# Patient Record
Sex: Male | Born: 1955
Health system: Southern US, Community
[De-identification: ages and names within clinical notes are randomized; demographics above are authoritative.]

## PROBLEM LIST (undated history)

## (undated) DIAGNOSIS — D72829 Elevated white blood cell count, unspecified: Secondary | ICD-10-CM

## (undated) DIAGNOSIS — N179 Acute kidney failure, unspecified: Secondary | ICD-10-CM

## (undated) DIAGNOSIS — E119 Type 2 diabetes mellitus without complications: Secondary | ICD-10-CM

## (undated) DIAGNOSIS — K5900 Constipation, unspecified: Secondary | ICD-10-CM

## (undated) DIAGNOSIS — D75838 Other thrombocytosis: Secondary | ICD-10-CM

## (undated) DIAGNOSIS — A419 Sepsis, unspecified organism: Secondary | ICD-10-CM

## (undated) DIAGNOSIS — R197 Diarrhea, unspecified: Secondary | ICD-10-CM

## (undated) DIAGNOSIS — R945 Abnormal results of liver function studies: Secondary | ICD-10-CM

## (undated) DIAGNOSIS — F1010 Alcohol abuse, uncomplicated: Secondary | ICD-10-CM

## (undated) DIAGNOSIS — E049 Nontoxic goiter, unspecified: Secondary | ICD-10-CM

## (undated) DIAGNOSIS — E079 Disorder of thyroid, unspecified: Secondary | ICD-10-CM

## (undated) DIAGNOSIS — E785 Hyperlipidemia, unspecified: Secondary | ICD-10-CM

## (undated) DIAGNOSIS — M79604 Pain in right leg: Secondary | ICD-10-CM

## (undated) DIAGNOSIS — R7989 Other specified abnormal findings of blood chemistry: Secondary | ICD-10-CM

## (undated) DIAGNOSIS — T8743 Infection of amputation stump, right lower extremity: Secondary | ICD-10-CM

## (undated) DIAGNOSIS — T8149XA Infection following a procedure, other surgical site, initial encounter: Secondary | ICD-10-CM

## (undated) DIAGNOSIS — M109 Gout, unspecified: Secondary | ICD-10-CM

## (undated) DIAGNOSIS — I10 Essential (primary) hypertension: Secondary | ICD-10-CM

## (undated) HISTORY — DX: Other thrombocytosis: D75.838

## (undated) HISTORY — DX: Sepsis, unspecified organism: A41.9

## (undated) HISTORY — DX: Hyperlipidemia, unspecified: E78.5

## (undated) HISTORY — DX: Pain in right leg: M79.604

## (undated) HISTORY — DX: Abnormal results of liver function studies: R94.5

## (undated) HISTORY — DX: Infection of amputation stump, right lower extremity: T87.43

## (undated) HISTORY — DX: Nontoxic goiter, unspecified: E04.9

## (undated) HISTORY — PX: THYROID SURGERY: SHX805

## (undated) HISTORY — DX: Disorder of thyroid, unspecified: E07.9

## (undated) HISTORY — DX: Other specified abnormal findings of blood chemistry: R79.89

## (undated) HISTORY — DX: Acute kidney failure, unspecified: N17.9

## (undated) HISTORY — DX: Gout, unspecified: M10.9

## (undated) HISTORY — DX: Type 2 diabetes mellitus without complications: E11.9

## (undated) HISTORY — DX: Essential (primary) hypertension: I10

## (undated) HISTORY — DX: Constipation, unspecified: K59.00

## (undated) HISTORY — DX: Diarrhea, unspecified: R19.7

## (undated) HISTORY — DX: Elevated white blood cell count, unspecified: D72.829

---

## 2011-05-10 DIAGNOSIS — E039 Hypothyroidism, unspecified: Secondary | ICD-10-CM | POA: Insufficient documentation

## 2011-12-01 ENCOUNTER — Other Ambulatory Visit: Payer: Self-pay | Admitting: Oncology

## 2013-04-23 DIAGNOSIS — Z8639 Personal history of other endocrine, nutritional and metabolic disease: Secondary | ICD-10-CM | POA: Insufficient documentation

## 2014-08-04 ENCOUNTER — Encounter: Payer: Self-pay | Admitting: Gastroenterology

## 2014-09-16 ENCOUNTER — Ambulatory Visit (AMBULATORY_SURGERY_CENTER): Payer: Self-pay

## 2014-09-16 VITALS — Ht 68.0 in | Wt 179.0 lb

## 2014-09-16 DIAGNOSIS — Z1211 Encounter for screening for malignant neoplasm of colon: Secondary | ICD-10-CM

## 2014-09-16 MED ORDER — MOVIPREP 100 G PO SOLR: ORAL | Status: DC

## 2014-09-16 NOTE — Progress Notes (Signed)
Per pt, no allergies to soy or egg products.Pt not taking any weight loss meds or using  O2 at home. 

## 2014-09-30 ENCOUNTER — Encounter: Payer: Self-pay | Admitting: Gastroenterology

## 2014-10-01 ENCOUNTER — Encounter: Payer: Self-pay | Admitting: Gastroenterology

## 2014-10-07 ENCOUNTER — Encounter: Payer: Self-pay | Admitting: Gastroenterology

## 2014-10-07 ENCOUNTER — Ambulatory Visit (AMBULATORY_SURGERY_CENTER): Payer: PRIVATE HEALTH INSURANCE | Admitting: Gastroenterology

## 2014-10-07 VITALS — BP 143/85 | HR 61 | Temp 97.7°F | Resp 17 | Ht 68.0 in | Wt 179.0 lb

## 2014-10-07 DIAGNOSIS — Z1211 Encounter for screening for malignant neoplasm of colon: Secondary | ICD-10-CM

## 2014-10-07 MED ORDER — SODIUM CHLORIDE 0.9 % IV SOLN: 500.0000 mL | INTRAVENOUS | Status: DC

## 2014-10-07 NOTE — Progress Notes (Signed)
Patient awakening,vss,report to rn 

## 2014-10-07 NOTE — Op Note (Signed)
[facility]  [address] [place], [id number]   COLONOSCOPY PROCEDURE REPORT  PATIENT: Todd, [name]  MR#: [id number] BIRTHDATE: 08/30/1955 , 35  yrs. old GENDER: male ENDOSCOPIST: Milus Banister, MD REFERRED [id number] Laurance Flatten, M.D. PROCEDURE DATE:  10/07/2014 PROCEDURE:   Colonoscopy, screening First Screening Colonoscopy - Avg.  risk and is 50 yrs.  old or older Yes.  Prior Negative Screening - Now for repeat screening. N/A  History of Adenoma - Now for follow-up colonoscopy & has been > or = to 3 yrs.  N/A ASA CLASS:   Class II INDICATIONS:Screening for colonic neoplasia and Colorectal Neoplasm Risk Assessment for this procedure is average risk. MEDICATIONS: Monitored anesthesia care and Propofol 300 mg IV  DESCRIPTION OF PROCEDURE:   After the risks benefits and alternatives of the procedure were thoroughly explained, informed consent was obtained.  The digital rectal exam revealed no abnormalities of the rectum.   The LB [id number]  endoscope was introduced through the anus and advanced to the cecum, which was identified by both the appendix and ileocecal valve. No adverse events experienced.   The quality of the prep was good.  The instrument was then slowly withdrawn as the colon was fully examined.   COLON FINDINGS: There was mild diverticulosis noted in the left colon.   The examination was otherwise normal.  Retroflexed views revealed no abnormalities. The time to cecum = 2.3 Withdrawal time = 7.2   The scope was withdrawn and the procedure completed. COMPLICATIONS: There were no immediate complications.  ENDOSCOPIC IMPRESSION: 1.   Mild diverticulosis was noted in the left colon 2.   The examination was otherwise normal  RECOMMENDATIONS: You should continue to follow colorectal cancer screening guidelines for "routine risk" patients with a repeat colonoscopy in 10 years.  eSigned:  Milus Banister, MD 10/07/2014 10:24  AM

## 2014-10-07 NOTE — Patient Instructions (Signed)
YOU HAD AN ENDOSCOPIC PROCEDURE TODAY AT [facility]:   Refer to the procedure report that was given to you for any specific questions about what was found during the examination.  If the procedure report does not answer your questions, please call your gastroenterologist to clarify.  If you requested that your care partner not be given the details of your procedure findings, then the procedure report has been included in a sealed envelope for you to review at your convenience later.  YOU SHOULD EXPECT: Some feelings of bloating in the abdomen. Passage of more gas than usual.  Walking can help get rid of the air that was put into your GI tract during the procedure and reduce the bloating. If you had a lower endoscopy (such as a colonoscopy or flexible sigmoidoscopy) you may notice spotting of blood in your stool or on the toilet paper. If you underwent a bowel prep for your procedure, you may not have a normal bowel movement for a few days.  Please Note:  You might notice some irritation and congestion in your nose or some drainage.  This is from the oxygen used during your procedure.  There is no need for concern and it should clear up in a day or so.  SYMPTOMS TO REPORT IMMEDIATELY:   Following lower endoscopy (colonoscopy or flexible sigmoidoscopy):  Excessive amounts of blood in the stool  Significant tenderness or worsening of abdominal pains  Swelling of the abdomen that is new, acute  Fever of 100F or higher    For urgent or emergent issues, a gastroenterologist can be reached at any hour by calling [phone].   DIET: Your first meal following the procedure should be a small meal and then it is ok to progress to your normal diet. Heavy or fried foods are harder to digest and may make you feel nauseous or bloated.  Likewise, meals heavy in dairy and vegetables can increase bloating.  Drink plenty of fluids but you should avoid alcoholic beverages for 24  hours.  ACTIVITY:  You should plan to take it easy for the rest of today and you should NOT DRIVE or use heavy machinery until tomorrow (because of the sedation medicines used during the test).    FOLLOW UP: Our staff will call the number listed on your records the next business day following your procedure to check on you and address any questions or concerns that you may have regarding the information given to you following your procedure. If we do not reach you, we will leave a message.  However, if you are feeling well and you are not experiencing any problems, there is no need to return our call.  We will assume that you have returned to your regular daily activities without incident.  If any biopsies were taken you will be contacted by phone or by letter within the next 1-3 weeks.  Please call us at [phone] if you have not heard about the biopsies in 3 weeks.    SIGNATURES/CONFIDENTIALITY: You and/or your care partner have signed paperwork which will be entered into your electronic medical record.  These signatures attest to the fact that that the information above on your After Visit Summary has been reviewed and is understood.  Full responsibility of the confidentiality of this discharge information lies with you and/or your care-partner.  Resume medications. Information given on diverticulosis and high fiber diet with discharge instructions.

## 2014-10-08 ENCOUNTER — Telehealth: Payer: Self-pay | Admitting: *Deleted

## 2014-10-08 NOTE — Telephone Encounter (Signed)
  Follow up Call-  Call back number 10/07/2014  Post procedure Call Back phone  # [phone]  Permission to leave phone message Yes      CALLED PATIENT TWICE, BOTH TIMES SOMEONE ANSWERS BUT JUST SAYS "HELLO", THEY ACT Mountain HEAR ME SPEAKING TO THEM. I DID USE 2 DIFFERENT PHONES HERE.

## 2015-03-11 ENCOUNTER — Encounter: Payer: Self-pay | Admitting: Family Medicine

## 2015-03-11 ENCOUNTER — Ambulatory Visit (INDEPENDENT_AMBULATORY_CARE_PROVIDER_SITE_OTHER): Payer: BLUE CROSS/BLUE SHIELD | Admitting: Family Medicine

## 2015-03-11 VITALS — BP 149/77 | HR 65 | Temp 97.9°F | Ht 68.0 in | Wt 178.0 lb

## 2015-03-11 DIAGNOSIS — M25571 Pain in right ankle and joints of right foot: Secondary | ICD-10-CM

## 2015-03-11 DIAGNOSIS — M79672 Pain in left foot: Secondary | ICD-10-CM | POA: Diagnosis not present

## 2015-03-11 MED ORDER — INDOMETHACIN 25 MG PO CAPS: ORAL_CAPSULE | ORAL | Status: DC

## 2015-03-11 NOTE — Patient Instructions (Signed)

## 2015-03-11 NOTE — Progress Notes (Signed)
   Subjective:    Patient ID: Todd [name], male    DOB: [birth year], 59 y.o.   MRN: [id number]  HPI Patient here today for left foot and right ankle pain. There is no history of gout and when questioned he denies any redness or increased temperature in the area. The distribution and the acute nature of his symptoms are still suggestive of gout beginning in the great toe and then spreading to the instep. He denies excess alcohol prior to onset. He is on no diarrhetic's which might raise uric acid.      There are no active problems to display for this patient.  Outpatient Encounter Prescriptions as of 03/11/2015  Medication Sig  . levothyroxine (SYNTHROID, LEVOTHROID) 112 MCG tablet Take 112 mcg by mouth daily before breakfast.   No facility-administered encounter medications on file as of 03/11/2015.      Review of Systems  Constitutional: Negative.   HENT: Negative.   Eyes: Negative.   Respiratory: Negative.   Cardiovascular: Negative.   Gastrointestinal: Negative.   Endocrine: Negative.   Genitourinary: Negative.   Musculoskeletal: Positive for arthralgias (left foot and right ankle pain).  Skin: Negative.   Allergic/Immunologic: Negative.   Neurological: Negative.   Hematological: Negative.   Psychiatric/Behavioral: Negative.        Objective:   Physical Exam  Constitutional: He appears well-developed and well-nourished.  Cardiovascular: Normal rate and regular rhythm.   Pulmonary/Chest: Effort normal and breath sounds normal.  Musculoskeletal:  There is no erythema or increased temp and joints of the feet. There is no soft tissue swelling.   BP 149/77 mmHg  Pulse 65  Temp(Src) 97.9 F (36.6 C) (Oral)  Ht 5\' 8"  (1.727 m)  Wt 178 lb (80.74 kg)  BMI 27.07 kg/m2        Assessment & Plan:  1. Left foot pain Will work on the assumption that symptoms are that of gout. Onset was one week ago so classic findings may have disappeared. Uric acid level if elevated  would support diagnosis positive lower normal does not refute the diagnosis either. Will treat with indomethacin 50 mg 3 times a day for 2 days and 25 mg 3 times a day for 5 days with 1 refill. If attacks occur frequently will consider use of medicine to lower uric acid, if elevated - Uric acid   Wardell Honour MD 2. Right ankle pain  - Uric acid

## 2015-03-12 LAB — URIC ACID: Uric Acid: 9.5 mg/dL — ABNORMAL HIGH (ref 3.7–8.6)

## 2015-05-06 ENCOUNTER — Encounter: Payer: BLUE CROSS/BLUE SHIELD | Admitting: Family Medicine

## 2015-05-13 ENCOUNTER — Encounter: Payer: BLUE CROSS/BLUE SHIELD | Admitting: Family Medicine

## 2015-05-20 ENCOUNTER — Ambulatory Visit (INDEPENDENT_AMBULATORY_CARE_PROVIDER_SITE_OTHER): Payer: BLUE CROSS/BLUE SHIELD | Admitting: Family Medicine

## 2015-05-20 ENCOUNTER — Encounter: Payer: Self-pay | Admitting: Family Medicine

## 2015-05-20 VITALS — BP 132/75 | HR 73 | Temp 97.5°F | Ht 68.0 in | Wt 185.0 lb

## 2015-05-20 DIAGNOSIS — Z1159 Encounter for screening for other viral diseases: Secondary | ICD-10-CM

## 2015-05-20 DIAGNOSIS — Z23 Encounter for immunization: Secondary | ICD-10-CM

## 2015-05-20 DIAGNOSIS — Z13220 Encounter for screening for lipoid disorders: Secondary | ICD-10-CM

## 2015-05-20 DIAGNOSIS — Z0000 Encounter for general adult medical examination without abnormal findings: Secondary | ICD-10-CM | POA: Diagnosis not present

## 2015-05-20 DIAGNOSIS — Z131 Encounter for screening for diabetes mellitus: Secondary | ICD-10-CM

## 2015-05-20 NOTE — Progress Notes (Signed)
BP 132/75 mmHg  Pulse 73  Temp(Src) 97.5 F (36.4 C) (Oral)  Ht _0  (1.727 m)  Wt 185 lb (83.915 kg)  BMI 28.14 kg/m2   Subjective:    Patient ID: Todd [name], male    DOB: 27-Jun-1955, 59 y.o.   MRN: [id number]  HPI: Todd [name] is a 59 y.o. male presenting on 05/20/2015 for Annual Exam   HPI Well adult exam Patient is coming in today for his well adult exam. He denies any chest pain, shortness of breath, headaches or vision issues, abdominal complaints, diarrhea, nausea, vomiting, or joint issues. He does have hypothyroidism and is having it treated by an endocrinologist and a second medication for it. He is due for screening labs.  Relevant past medical, surgical, family and social history reviewed and updated as indicated. Interim medical history since our last visit reviewed. Allergies and medications reviewed and updated.  Review of Systems  Constitutional: Negative for fever and chills.  HENT: Negative for congestion, ear discharge and ear pain.   Eyes: Negative for discharge and visual disturbance.  Respiratory: Negative for cough, chest tightness, shortness of breath and wheezing.   Cardiovascular: Negative for chest pain and leg swelling.  Gastrointestinal: Negative for abdominal pain, diarrhea and constipation.  Genitourinary: Negative for difficulty urinating.  Musculoskeletal: Negative for back pain and gait problem.  Skin: Negative for rash.  Neurological: Negative for dizziness, syncope, light-headedness and headaches.  All other systems reviewed and are negative.   Per HPI unless specifically indicated above     Medication List       This list is accurate as of: 05/20/15 10:45 AM.  Always use your most recent med list.               indomethacin 25 MG capsule  Commonly known as:  INDOCIN  Take 2 tablets after meals 3 times a day for 2 days, then 1 tablet after meals for 5 days     levothyroxine 112 MCG tablet  Commonly known as:   SYNTHROID, LEVOTHROID  Take 112 mcg by mouth daily before breakfast.           Objective:    BP 132/75 mmHg  Pulse 73  Temp(Src) 97.5 F (36.4 C) (Oral)  Ht _1  (1.727 m)  Wt 185 lb (83.915 kg)  BMI 28.14 kg/m2  Wt Readings from Last 3 Encounters:  05/20/15 185 lb (83.915 kg)  03/11/15 178 lb (80.74 kg)  10/07/14 179 lb (81.194 kg)    Physical Exam  Constitutional: He is oriented to person, place, and time. He appears well-developed and well-nourished. No distress.  HENT:  Right Ear: External ear normal.  Left Ear: External ear normal.  Nose: Nose normal.  Mouth/Throat: Oropharynx is clear and moist.  Eyes: Conjunctivae and EOM are normal. Pupils are equal, round, and reactive to light. Right eye exhibits no discharge. No scleral icterus.  Neck: Neck supple. No thyromegaly (had thyroidectomy) present.  Cardiovascular: Normal rate, regular rhythm, normal heart sounds and intact distal pulses.   No murmur heard. Pulmonary/Chest: Effort normal and breath sounds normal. No respiratory distress. He has no wheezes.  Musculoskeletal: Normal range of motion. He exhibits no edema.  Lymphadenopathy:    He has no cervical adenopathy.  Neurological: He is alert and oriented to person, place, and time. Coordination normal.  Skin: Skin is warm and dry. No rash noted. He is not diaphoretic.  Psychiatric: He has a normal mood and affect. His behavior is normal.  Vitals reviewed.   Results for orders placed or performed in visit on 03/11/15  Uric acid  Result Value Ref Range   Uric Acid 9.5 (H) 3.7 - 8.6 mg/dL      Assessment & Plan:   Problem List Items Addressed This Visit    None    Visit Diagnoses    Well adult exam    -  Primary    Lipid screening        Relevant Orders    CMP14+EGFR    Screening for diabetes mellitus        Relevant Orders    Lipid panel    Need for hepatitis C screening test        Relevant Orders    Hepatitis C antibody        Follow up  plan: Return in about 1 year (around 05/19/2016), or if symptoms worsen or fail to improve.  Counseling provided for all of the vaccine components Orders Placed This Encounter  Procedures  . CMP14+EGFR  . Lipid panel  . Hepatitis C antibody    Caryl Pina, MD [facility] 05/20/2015, 10:45 AM

## 2015-12-20 ENCOUNTER — Observation Stay (HOSPITAL_COMMUNITY)
Admission: EM | Admit: 2015-12-20 | Discharge: 2015-12-22 | Disposition: A | Discharge status: Home / Self Care | Payer: BLUE CROSS/BLUE SHIELD | Attending: Family Medicine | Admitting: Family Medicine

## 2015-12-20 ENCOUNTER — Encounter (HOSPITAL_COMMUNITY): Payer: Self-pay | Admitting: Emergency Medicine

## 2015-12-20 ENCOUNTER — Emergency Department (HOSPITAL_COMMUNITY): Payer: BLUE CROSS/BLUE SHIELD

## 2015-12-20 DIAGNOSIS — M109 Gout, unspecified: Secondary | ICD-10-CM | POA: Insufficient documentation

## 2015-12-20 DIAGNOSIS — B370 Candidal stomatitis: Secondary | ICD-10-CM

## 2015-12-20 DIAGNOSIS — J029 Acute pharyngitis, unspecified: Secondary | ICD-10-CM | POA: Diagnosis present

## 2015-12-20 DIAGNOSIS — E1165 Type 2 diabetes mellitus with hyperglycemia: Principal | ICD-10-CM | POA: Insufficient documentation

## 2015-12-20 DIAGNOSIS — Z794 Long term (current) use of insulin: Secondary | ICD-10-CM | POA: Diagnosis not present

## 2015-12-20 DIAGNOSIS — E860 Dehydration: Secondary | ICD-10-CM | POA: Insufficient documentation

## 2015-12-20 DIAGNOSIS — I700 Atherosclerosis of aorta: Secondary | ICD-10-CM | POA: Diagnosis not present

## 2015-12-20 DIAGNOSIS — N179 Acute kidney failure, unspecified: Secondary | ICD-10-CM | POA: Insufficient documentation

## 2015-12-20 DIAGNOSIS — E1100 Type 2 diabetes mellitus with hyperosmolarity without nonketotic hyperglycemic-hyperosmolar coma (NKHHC): Secondary | ICD-10-CM | POA: Diagnosis not present

## 2015-12-20 DIAGNOSIS — E870 Hyperosmolality and hypernatremia: Secondary | ICD-10-CM | POA: Diagnosis not present

## 2015-12-20 DIAGNOSIS — E088 Diabetes mellitus due to underlying condition with unspecified complications: Secondary | ICD-10-CM

## 2015-12-20 DIAGNOSIS — E890 Postprocedural hypothyroidism: Secondary | ICD-10-CM | POA: Insufficient documentation

## 2015-12-20 DIAGNOSIS — E1122 Type 2 diabetes mellitus with diabetic chronic kidney disease: Secondary | ICD-10-CM

## 2015-12-20 DIAGNOSIS — N183 Chronic kidney disease, stage 3 unspecified: Secondary | ICD-10-CM

## 2015-12-20 DIAGNOSIS — E119 Type 2 diabetes mellitus without complications: Secondary | ICD-10-CM

## 2015-12-20 DIAGNOSIS — R748 Abnormal levels of other serum enzymes: Secondary | ICD-10-CM | POA: Insufficient documentation

## 2015-12-20 LAB — GLUCOSE, CAPILLARY
GLUCOSE-CAPILLARY: 233 mg/dL — AB (ref 65–99)
GLUCOSE-CAPILLARY: 277 mg/dL — AB (ref 65–99)
GLUCOSE-CAPILLARY: 82 mg/dL (ref 65–99)
GLUCOSE-CAPILLARY: 96 mg/dL (ref 65–99)
Glucose-Capillary: 507 mg/dL (ref 65–99)
Glucose-Capillary: 386 mg/dL — ABNORMAL HIGH (ref 65–99)
Glucose-Capillary: 275 mg/dL — ABNORMAL HIGH (ref 65–99)
Glucose-Capillary: 235 mg/dL — ABNORMAL HIGH (ref 65–99)
Glucose-Capillary: 199 mg/dL — ABNORMAL HIGH (ref 65–99)

## 2015-12-20 LAB — URINE MICROSCOPIC-ADD ON

## 2015-12-20 LAB — COMPREHENSIVE METABOLIC PANEL
ALBUMIN: 4 g/dL (ref 3.5–5.0)
ALK PHOS: 130 U/L — AB (ref 38–126)
ALT: 33 U/L (ref 17–63)
AST: 31 U/L (ref 15–41)
Anion gap: 15 (ref 5–15)
BUN: 69 mg/dL — AB (ref 6–20)
CALCIUM: 9 mg/dL (ref 8.9–10.3)
CHLORIDE: 86 mmol/L — AB (ref 101–111)
CO2: 24 mmol/L (ref 22–32)
CREATININE: 2.16 mg/dL — AB (ref 0.61–1.24)
GFR calc Af Amer: 36 mL/min — ABNORMAL LOW (ref >60)
GFR, EST NON AFRICAN AMERICAN: 31 mL/min — AB (ref >60)
Glucose, Bld: 795 mg/dL (ref 65–99)
Potassium: 4.6 mmol/L (ref 3.5–5.1)
Sodium: 125 mmol/L — ABNORMAL LOW (ref 135–145)
Total Bilirubin: 1.6 mg/dL — ABNORMAL HIGH (ref 0.3–1.2)
Total Protein: 10.4 g/dL — ABNORMAL HIGH (ref 6.5–8.1)

## 2015-12-20 LAB — CBG MONITORING, ED: Glucose-Capillary: >600 mg/dL (ref 65–99)

## 2015-12-20 LAB — CBC WITH DIFFERENTIAL/PLATELET
BASOS ABS: 0 10*3/uL (ref 0.0–0.1)
BASOS PCT: 0 %
EOS PCT: 0 %
Eosinophils Absolute: 0 10*3/uL (ref 0.0–0.7)
HEMATOCRIT: 51.5 % (ref 39.0–52.0)
Hemoglobin: 18 g/dL — ABNORMAL HIGH (ref 13.0–17.0)
Lymphocytes Relative: 16 %
Lymphs Abs: 1.8 10*3/uL (ref 0.7–4.0)
MCH: 30.4 pg (ref 26.0–34.0)
MCHC: 35 g/dL (ref 30.0–36.0)
MCV: 86.8 fL (ref 78.0–100.0)
MONO ABS: 0.6 10*3/uL (ref 0.1–1.0)
Monocytes Relative: 6 %
NEUTROS ABS: 8.6 10*3/uL — AB (ref 1.7–7.7)
Neutrophils Relative %: 78 %
Platelets: 705 10*3/uL — ABNORMAL HIGH (ref 150–400)
RBC: 5.93 MIL/uL — AB (ref 4.22–5.81)
RDW: 13.3 % (ref 11.5–15.5)
WBC: 11 10*3/uL — AB (ref 4.0–10.5)

## 2015-12-20 LAB — URINALYSIS, ROUTINE W REFLEX MICROSCOPIC
BILIRUBIN URINE: NEGATIVE
Glucose, UA: >1000 mg/dL — AB
LEUKOCYTES UA: NEGATIVE
NITRITE: NEGATIVE
PH: 5.5 (ref 5.0–8.0)
Protein, ur: 30 mg/dL — AB
Specific Gravity, Urine: 1.01 (ref 1.005–1.030)

## 2015-12-20 LAB — TSH: TSH: 0.694 u[IU]/mL (ref 0.350–4.500)

## 2015-12-20 LAB — LIPASE, BLOOD: LIPASE: 69 U/L — AB (ref 11–51)

## 2015-12-20 MED ORDER — NYSTATIN 100000 UNIT/ML MT SUSP
5.0000 mL | Freq: Four times a day (QID) | OROMUCOSAL | Status: DC
  Administered 2015-12-20 – 2015-12-22 (×9): 500000 [IU] via ORAL
  Filled 2015-12-20 (×8): qty 5

## 2015-12-20 MED ORDER — ENOXAPARIN SODIUM 40 MG/0.4ML ~~LOC~~ SOLN
40.0000 mg | SUBCUTANEOUS | Status: DC
  Administered 2015-12-20 – 2015-12-21 (×2): 40 mg via SUBCUTANEOUS
  Filled 2015-12-20 (×2): qty 0.4

## 2015-12-20 MED ORDER — SODIUM CHLORIDE 0.9% FLUSH
3.0000 mL | Freq: Two times a day (BID) | INTRAVENOUS | Status: DC
  Administered 2015-12-20 – 2015-12-22 (×3): 3 mL via INTRAVENOUS

## 2015-12-20 MED ORDER — SODIUM CHLORIDE 0.9 % IV BOLUS (SEPSIS)
1000.0000 mL | Freq: Once | INTRAVENOUS | Status: AC
  Administered 2015-12-20: 1000 mL via INTRAVENOUS

## 2015-12-20 MED ORDER — DEXTROSE-NACL 5-0.45 % IV SOLN: INTRAVENOUS | Status: DC

## 2015-12-20 MED ORDER — ONDANSETRON HCL 4 MG PO TABS: 4.0000 mg | ORAL_TABLET | Freq: Four times a day (QID) | ORAL | Status: DC | PRN

## 2015-12-20 MED ORDER — DEXTROSE 50 % IV SOLN: 25.0000 mL | INTRAVENOUS | Status: DC | PRN

## 2015-12-20 MED ORDER — LEVOTHYROXINE SODIUM 112 MCG PO TABS
112.0000 ug | ORAL_TABLET | Freq: Every day | ORAL | Status: DC
  Administered 2015-12-21 – 2015-12-22 (×2): 112 ug via ORAL
  Filled 2015-12-20 (×2): qty 1

## 2015-12-20 MED ORDER — SODIUM CHLORIDE 0.9 % IV SOLN
INTRAVENOUS | Status: AC
  Administered 2015-12-20: 15:00:00 via INTRAVENOUS

## 2015-12-20 MED ORDER — DEXTROSE-NACL 5-0.45 % IV SOLN
INTRAVENOUS | Status: DC
  Administered 2015-12-20: 20:00:00 via INTRAVENOUS

## 2015-12-20 MED ORDER — SODIUM CHLORIDE 0.9 % IV SOLN
INTRAVENOUS | Status: DC
  Administered 2015-12-20: 5.4 [IU]/h via INTRAVENOUS
  Filled 2015-12-20: qty 2.5

## 2015-12-20 MED ORDER — LIVING WELL WITH DIABETES BOOK
Freq: Once | Status: AC
  Administered 2015-12-22: 10:00:00
  Filled 2015-12-20: qty 1

## 2015-12-20 MED ORDER — ONDANSETRON HCL 4 MG/2ML IJ SOLN
4.0000 mg | Freq: Once | INTRAMUSCULAR | Status: AC
  Administered 2015-12-20: 4 mg via INTRAVENOUS
  Filled 2015-12-20: qty 2

## 2015-12-20 MED ORDER — ACETAMINOPHEN 650 MG RE SUPP: 650.0000 mg | Freq: Four times a day (QID) | RECTAL | Status: DC | PRN

## 2015-12-20 MED ORDER — POLYETHYLENE GLYCOL 3350 17 G PO PACK: 17.0000 g | PACK | Freq: Every day | ORAL | Status: DC | PRN

## 2015-12-20 MED ORDER — FLUCONAZOLE IN SODIUM CHLORIDE 200-0.9 MG/100ML-% IV SOLN
200.0000 mg | INTRAVENOUS | Status: DC
  Administered 2015-12-20: 200 mg via INTRAVENOUS
  Filled 2015-12-20 (×2): qty 100

## 2015-12-20 MED ORDER — SODIUM CHLORIDE 0.9 % IV BOLUS (SEPSIS)
1000.0000 mL | Freq: Once | INTRAVENOUS | Status: AC
Start: 2015-12-20 — End: 2015-12-20
  Administered 2015-12-20: 1000 mL via INTRAVENOUS

## 2015-12-20 MED ORDER — SODIUM CHLORIDE 0.9 % IV SOLN
INTRAVENOUS | Status: DC
  Administered 2015-12-21 – 2015-12-22 (×2): via INTRAVENOUS

## 2015-12-20 MED ORDER — ACETAMINOPHEN 325 MG PO TABS: 650.0000 mg | ORAL_TABLET | Freq: Four times a day (QID) | ORAL | Status: DC | PRN

## 2015-12-20 MED ORDER — INSULIN REGULAR HUMAN 100 UNIT/ML IJ SOLN: INTRAMUSCULAR | Status: DC

## 2015-12-20 MED ORDER — ONDANSETRON HCL 4 MG/2ML IJ SOLN: 4.0000 mg | Freq: Four times a day (QID) | INTRAMUSCULAR | Status: DC | PRN

## 2015-12-20 MED ORDER — INSULIN REGULAR BOLUS VIA INFUSION
0.0000 [IU] | Freq: Three times a day (TID) | INTRAVENOUS | Status: DC
  Administered 2015-12-20: 1.2 [IU] via INTRAVENOUS
  Filled 2015-12-20: qty 10

## 2015-12-20 NOTE — ED Notes (Signed)
MD notified of Glucose on CMP.

## 2015-12-20 NOTE — ED Notes (Signed)
Attempted report x1. 

## 2015-12-20 NOTE — ED Provider Notes (Addendum)
History  By signing my name below, I, Bea Graff, attest that this documentation has been prepared under the direction and in the presence of Nat Christen, MD. Electronically Signed: Bea Graff, ED Scribe. 12/20/2015. 11:35 AM.  Chief Complaint  Patient presents with  . Sore Throat   The history is provided by the patient and medical records. No language interpreter was used.    HPI Comments:  Todd [name] is a 60 y.o. male who presents to the Emergency Department complaining of a sore throat that began about one week ago. Pt reports associated generalized weakness and some mild SOB. He reports decrease in appetite. He has not taken anything to treat his symptoms. He denies modifying factors. He denies weight loss, fever, chills. Pt's PCP is Dr. Sabra Heck in [facility]. He reports PMHx of thyroid disease. He reports having a goiter removed about two years ago. Pt reports he is a smoker and drinks about one beer daily. He denies IV drug use or homosexual sex.  Past Medical History  Diagnosis Date  . Thyroid disease   . Gout    Past Surgical History  Procedure Laterality Date  . Thyroid surgery     Family History  Problem Relation Age of Onset  . Pneumonia Father    Social History  Substance Use Topics  . Smoking status: Current Every Day Smoker -- 0.50 packs/day    Types: Cigarettes    Last Attempt to Quit: 11/12/2014  . Smokeless tobacco: Never Used  . Alcohol Use: 0.0 oz/week    0 Standard drinks or equivalent per week     Comment: occasionally    Review of Systems A complete 10 system review of systems was obtained and all systems are negative except as noted in the HPI and PMH.   Allergies  Review of patient's allergies indicates no known allergies.  Home Medications   Prior to Admission medications   Medication Sig Start Date End Date Taking? Authorizing Provider  levothyroxine (SYNTHROID, LEVOTHROID) 112 MCG tablet Take 112 mcg by mouth daily  before breakfast.   Yes Historical Provider, MD   Triage Vitals: BP 153/95 mmHg  Pulse 78  Temp(Src) 97.7 F (36.5 C) (Oral)  Resp 16  SpO2 100% Physical Exam  Constitutional: He is oriented to person, place, and time. He appears well-developed and well-nourished. No distress.  Appears dehydrated.  HENT:  Head: Normocephalic and atraumatic.  White plaque like distrubution to tongue and oropharyngeal area.  Eyes: Conjunctivae are normal.  Neck: Neck supple.  Cardiovascular: Normal rate and regular rhythm.   Pulmonary/Chest: Effort normal and breath sounds normal.  Abdominal: Soft. Bowel sounds are normal.  Musculoskeletal: Normal range of motion.  Neurological: He is alert and oriented to person, place, and time.  Skin: Skin is warm and dry.  Psychiatric: He has a normal mood and affect. His behavior is normal.  Nursing note and vitals reviewed.   ED Course  Procedures (including critical care time) DIAGNOSTIC STUDIES: Oxygen Saturation is 100% on RA, normal by my interpretation.   COORDINATION OF CARE: 9:10 AM- Will check labs and order IV fluids. Pt verbalizes understanding and agrees to plan.  Medications  dextrose 5 %-0.45 % sodium chloride infusion (not administered)  insulin regular (NOVOLIN R,HUMULIN R) 250 Units in sodium chloride 0.9 % 250 mL (1 Units/mL) infusion (5.4 Units/hr Intravenous New Bag/Given 12/20/15 1228)  fluconazole (DIFLUCAN) IVPB 200 mg (not administered)  sodium chloride 0.9 % bolus 1,000 mL (0 mLs Intravenous Stopped 12/20/15 1211)  ondansetron (ZOFRAN) injection 4 mg (4 mg Intravenous Given 12/20/15 0935)  sodium chloride 0.9 % bolus 1,000 mL (0 mLs Intravenous Stopped 12/20/15 1211)    Labs Review Labs Reviewed  CBC WITH DIFFERENTIAL/PLATELET - Abnormal; Notable for the following:    WBC 11.0 (*)    RBC 5.93 (*)    Hemoglobin 18.0 (*)    Platelets 705 (*)    Neutro Abs 8.6 (*)    All other components within normal limits  COMPREHENSIVE  METABOLIC PANEL - Abnormal; Notable for the following:    Sodium 125 (*)    Chloride 86 (*)    Glucose, Bld 795 (*)    BUN 69 (*)    Creatinine, Ser 2.16 (*)    Total Protein 10.4 (*)    Alkaline Phosphatase 130 (*)    Total Bilirubin 1.6 (*)    GFR calc non Af Amer 31 (*)    GFR calc Af Amer 36 (*)    All other components within normal limits  URINALYSIS, ROUTINE W REFLEX MICROSCOPIC (NOT AT [facility]) - Abnormal; Notable for the following:    Glucose, UA >1000 (*)    Hgb urine dipstick MODERATE (*)    Ketones, ur TRACE (*)    Protein, ur 30 (*)    All other components within normal limits  LIPASE, BLOOD - Abnormal; Notable for the following:    Lipase 69 (*)    All other components within normal limits  URINE MICROSCOPIC-ADD ON - Abnormal; Notable for the following:    Squamous Epithelial / LPF 0-5 (*)    Bacteria, UA RARE (*)    All other components within normal limits  CBG MONITORING, ED - Abnormal; Notable for the following:    Glucose-Capillary >600 (*)    All other components within normal limits  TSH    Imaging Review Dg Chest 2 View  12/20/2015  CLINICAL DATA:  Dysphagia and sore throat EXAM: CHEST  2 VIEW COMPARISON:  Oct 15, 2010 FINDINGS: There is scarring in the right lung base. Lungs elsewhere clear. Heart size and pulmonary vascularity are normal. No adenopathy. There is atherosclerotic calcification in the aortic arch region. No bone lesions are evident. There are surgical clips at the cervical -thoracic junction anteriorly. IMPRESSION: Scarring right base. No edema or consolidation. Aortic atherosclerosis. Electronically Signed   By: Lowella Grip III M.D.   On: 12/20/2015 09:50   I have personally reviewed and evaluated these images and lab results as part of my medical decision-making.   EKG Interpretation None     CRITICAL CARE Performed by: Nat Christen Total critical care time: 30 minutes Critical care time was exclusive of separately billable procedures  and treating other patients. Critical care was necessary to treat or prevent imminent or life-threatening deterioration. Critical care was time spent personally by me on the following activities: development of treatment plan with patient and/or surrogate as well as nursing, discussions with consultants, evaluation of patient's response to treatment, examination of patient, obtaining history from patient or surrogate, ordering and performing treatments and interventions, ordering and review of laboratory studies, ordering and review of radiographic studies, pulse oximetry and re-evaluation of patient's condition. MDM   Final diagnoses:  Diabetes mellitus type 2 in nonobese (HCC)  Candida, oral    Hx and PE c/w new onset diabetes, suspect type II. We'll vigorously hydrate. Start Glucomander protocol. Will also Rx IV Diflucan for oral candida  I personally performed the services described in this documentation, which was scribed  in my presence. The recorded information has been reviewed and is accurate.      Nat Christen, MD 12/20/15 [place], MD 12/20/15 [phone]

## 2015-12-20 NOTE — ED Notes (Signed)
Patent with c/o sore throat x 3 days, worse with swallowing. Patient with noted white film to tongue and back of throat. Tonsil red with some swelling. Reports he just feels bad.

## 2015-12-20 NOTE — Progress Notes (Signed)
Inpatient Diabetes Program Recommendations  AACE/ADA: New Consensus Statement on Inpatient Glycemic Control (2015)  Target Ranges:  Prepandial:   less than 140 mg/dL      Peak postprandial:   less than 180 mg/dL (1-2 hours)      Critically ill patients:  140 - 180 mg/dL   Lab Results  Component Value Date   GLUCAP 275* 12/20/2015   Results for Todd [name], Todd [name] (MRN [id number]) as of 12/20/2015 17:15  Ref. Range 12/20/2015 12:16 12/20/2015 13:18 12/20/2015 14:40 12/20/2015 15:51 12/20/2015 16:59  Glucose-Capillary Latest Ref Range: 65-99 mg/dL >600 (HH) >600 (HH) 507 (HH) 386 (H) 275 (H)   Review of Glycemic Control  Diabetes history: Newly-diagnosis DM Outpatient Diabetes medications: None Current orders for Inpatient glycemic control: IV insulin via GlucoStabilizer CO2 - 24. AG - 15 Trace ketones in urine  Inpatient Diabetes Program Recommendations:    Continue with IV insulin until criteria met to transition to SQ insulin. Will order Living Well With Diabetes book Diabetes videos on pt ed channel OP Diabetes Education Consult for newly-diagnosed DM. 0.2 units/kg - Consider Lantus 14 units Q24H. (Give 2 hours prior to discontinuation of drip) Novolog sensitive tidwc and hs Will likely need meal coverage insulin. Begin with Novolog 3 units tidwc.  Diabetes Coordinator to f/u in am.  Thank you. Lorenda Peck, RD, LDN, CDE Inpatient Diabetes Coordinator [phone]

## 2015-12-20 NOTE — H&P (Signed)
History and Physical  Todd [name] [id number] DOB: 1956-04-21 DOA: 12/20/2015  PCP: Redge Gainer, MD  Patient coming from: home  Chief Complaint: sore throat  HPI:  Patient is a 60 year old male with a hx of of thyroid disease who presents to the ED with complaints of a sore throat which onset about a week ago. He reports recent polydipsia, blurred vision, decreased appetite, generalized weakness, mild nausea, and mild shortness of breath. He has been very thirsty. He has not taken anything to treat his symptoms. He denies any modifying factors.  While in the emergency department, he was noted to be afebrile, vital signs were stable, and not hypoxic. He was noted to have an elevated blood glucose of 795 and was admitted to SDU for management of hyperosmolar hyperglycemia.   In the emergency department: afebrile, VSS, no hypoxia Pertinent labs: AG 15, glucose 705, BUN 69, creatinine 2.16 EKG: Independently reviewed.  Imaging: CXR independently reviewed. No acute disease.  Review of Systems: positive for blurred vision, nausea, shortness of breath Negative for fever, rash, new muscle aches, chest pain, dysuria, bleeding, vomiting/abdominal pain.  Past Medical History  Diagnosis Date  . Thyroid disease   . Gout     Past Surgical History  Procedure Laterality Date  . Thyroid surgery       reports that he has been smoking Cigarettes.  He has been smoking about 0.50 packs per day. He has never used smokeless tobacco. He reports that he drinks alcohol. He reports that he does not use illicit drugs. Ambulatory status: ambulatory  No Known Allergies  Family History  Problem Relation Age of Onset  . Pneumonia Father      Prior to Admission medications   Medication Sig Start Date End Date Taking? Authorizing Provider  levothyroxine (SYNTHROID, LEVOTHROID) 112 MCG tablet Take 112 mcg by mouth daily before breakfast.   Yes Historical Provider, MD    Physical Exam: Filed  Vitals:   12/20/15 1200 12/20/15 1328 12/20/15 1400 12/20/15 1446  BP:  189/92 151/93   Pulse: 58 60 69   Temp:      TempSrc:      Resp:  18    Height:    5\' 8"  (1.727 m)  Weight:    73.1 kg (161 lb 2.5 oz)  SpO2: 100% 96% 96%    Constitutional:  . Appears calm and comfortable Eyes:  . PERRL and irises appear normal . Normal conjunctivae and lids ENMT:  . external ears, nose appear normal . grossly normal hearing . Lips appear normal; teeth normal, gums normal . Oropharynx: Exudate over tongue and buccal mucosa. Neck:  . neck appears normal, no masses, normal ROM, supple . no thyromegaly Respiratory:  . CTA bilaterally, no w/r/r.  . Respiratory effort normal. No retractions or accessory muscle use Cardiovascular:  . RRR, no m/r/g  . No LE extremity edema   Abdomen:  . Abdomen appears normal; no tenderness or masses . No hernias Musculoskeletal:  . RUE, LUE, RLE, LLE   o strength and tone normal, no atrophy, no abnormal movements o No tenderness, masses Neurologic:  . Grossly normal Psychiatric:  . judgement and insight appear normal . Mental status o Mood, affect appropriate  Wt Readings from Last 3 Encounters:  12/20/15 73.1 kg (161 lb 2.5 oz)  05/20/15 83.915 kg (185 lb)  03/11/15 80.74 kg (178 lb)    I have personally reviewed following labs and imaging studies  Labs on Admission:  CBC:  Recent  Labs Lab 12/20/15 0934  WBC 11.0*  NEUTROABS 8.6*  HGB 18.0*  HCT 51.5  MCV 86.8  PLT [id number]*   Basic Metabolic Panel:  Recent Labs Lab 12/20/15 0934  NA 125*  K 4.6  CL 86*  CO2 24  GLUCOSE 795*  BUN 69*  CREATININE 2.16*  CALCIUM 9.0   Liver Function Tests:  Recent Labs Lab 12/20/15 0934  AST 31  ALT 33  ALKPHOS 130*  BILITOT 1.6*  PROT 10.4*  ALBUMIN 4.0    Recent Labs Lab 12/20/15 0934  LIPASE 69*   CBG:  Recent Labs Lab 12/20/15 1216 12/20/15 1318 12/20/15 1440  GLUCAP >600* >600* 507*   Thyroid Function  Tests:  Recent Labs  12/20/15 0934  TSH 0.694   Urine analysis:    Component Value Date/Time   COLORURINE YELLOW 12/20/2015 0923   APPEARANCEUR CLEAR 12/20/2015 0923   LABSPEC 1.010 12/20/2015 0923   PHURINE 5.5 12/20/2015 0923   GLUCOSEU >1000* 12/20/2015 0923   HGBUR MODERATE* 12/20/2015 0923   BILIRUBINUR NEGATIVE 12/20/2015 0923   KETONESUR TRACE* 12/20/2015 0923   PROTEINUR 30* 12/20/2015 0923   NITRITE NEGATIVE 12/20/2015 0923   LEUKOCYTESUR NEGATIVE 12/20/2015 0923   Radiological Exams on Admission: Dg Chest 2 View  12/20/2015  CLINICAL DATA:  Dysphagia and sore throat EXAM: CHEST  2 VIEW COMPARISON:  Oct 15, 2010 FINDINGS: There is scarring in the right lung base. Lungs elsewhere clear. Heart size and pulmonary vascularity are normal. No adenopathy. There is atherosclerotic calcification in the aortic arch region. No bone lesions are evident. There are surgical clips at the cervical -thoracic junction anteriorly. IMPRESSION: Scarring right base. No edema or consolidation. Aortic atherosclerosis. Electronically Signed   By: Lowella Grip III M.D.   On: 12/20/2015 09:50    EKG: Independently reviewed.   Principal Problem:   Uncontrolled type 2 diabetes mellitus with hyperosmolar nonketotic hyperglycemia (HCC) Active Problems:   Diabetes mellitus ([place])   AKI (acute kidney injury) ([place])   Oral candidiasis   Assessment/Plan 1. Hyperosmolar hyperglycemia without ketosis. Anion gap normal. Blood sugar 795. CBG >600. 2. Diabetes mellitus type 2. This is a new diagnosis. Discussed with patient treatment plan. 3. Acute kidney injury secondary to dehydration from profound hyperglycemia. 4. Thrush secondary most likely to hyperglycemia. 5. Elevated lipase without abdominal pain. 6. Post-op hypothyroid. TSH within normal limits. 7. Gout, quiescent   Admit to SDU  S/p 2 L IVF. Continue aggressive IVF, start insulin infusion. Hgb A1c.  Consult dietitian, diabetes  RN  Repeat BMP in a.m.  Nystatin oral  Screen HIV  DVT prophylaxis: Lovenox Code Status: Full Family Communication: Discussed with patient, no family present Disposition Plan: admit to SDU, discharge home once improved   Consults called: none   Admission status: admit as observation    Time spent: 55 minutes  [name], MD  [facility] Direct contact: [phone] --[place]ia [place]  --[email or web address]; password [email or web address]  7PM-7AM contact night coverage as above  12/20/2015, 3:41 PM  By signing my name below, I, Delene Ruffini, attest that this documentation has been prepared under the direction and in the presence of Lamesha Tibbits P. Sarajane Jews, MD. Electronically Signed: Delene Ruffini, Scribe.  12/20/2015 2:30pm  I personally performed the services described in this documentation. All medical record entries made by the scribe were at my direction. I have reviewed the chart and agree that the record reflects my personal performance and is accurate and complete. [name], MD

## 2015-12-21 DIAGNOSIS — E1100 Type 2 diabetes mellitus with hyperosmolarity without nonketotic hyperglycemic-hyperosmolar coma (NKHHC): Secondary | ICD-10-CM | POA: Diagnosis not present

## 2015-12-21 DIAGNOSIS — B370 Candidal stomatitis: Secondary | ICD-10-CM | POA: Diagnosis not present

## 2015-12-21 DIAGNOSIS — N183 Chronic kidney disease, stage 3 (moderate): Secondary | ICD-10-CM

## 2015-12-21 DIAGNOSIS — E1122 Type 2 diabetes mellitus with diabetic chronic kidney disease: Secondary | ICD-10-CM

## 2015-12-21 DIAGNOSIS — N179 Acute kidney failure, unspecified: Secondary | ICD-10-CM | POA: Diagnosis not present

## 2015-12-21 LAB — BASIC METABOLIC PANEL
Anion gap: 6 (ref 5–15)
Anion gap: 6 (ref 5–15)
BUN: 43 mg/dL — ABNORMAL HIGH (ref 6–20)
BUN: 40 mg/dL — ABNORMAL HIGH (ref 6–20)
CALCIUM: 7.7 mg/dL — AB (ref 8.9–10.3)
CHLORIDE: 105 mmol/L (ref 101–111)
CHLORIDE: 107 mmol/L (ref 101–111)
CO2: 25 mmol/L (ref 22–32)
CO2: 23 mmol/L (ref 22–32)
CREATININE: 1.34 mg/dL — AB (ref 0.61–1.24)
Calcium: 7.9 mg/dL — ABNORMAL LOW (ref 8.9–10.3)
Creatinine, Ser: 1.29 mg/dL — ABNORMAL HIGH (ref 0.61–1.24)
GFR calc non Af Amer: 59 mL/min — ABNORMAL LOW (ref >60)
GFR, EST NON AFRICAN AMERICAN: 56 mL/min — AB (ref >60)
Glucose, Bld: 126 mg/dL — ABNORMAL HIGH (ref 65–99)
Glucose, Bld: 108 mg/dL — ABNORMAL HIGH (ref 65–99)
POTASSIUM: 3.1 mmol/L — AB (ref 3.5–5.1)
Potassium: 3.1 mmol/L — ABNORMAL LOW (ref 3.5–5.1)
SODIUM: 136 mmol/L (ref 135–145)
SODIUM: 136 mmol/L (ref 135–145)

## 2015-12-21 LAB — GLUCOSE, CAPILLARY
GLUCOSE-CAPILLARY: 141 mg/dL — AB (ref 65–99)
GLUCOSE-CAPILLARY: 142 mg/dL — AB (ref 65–99)
GLUCOSE-CAPILLARY: 165 mg/dL — AB (ref 65–99)
GLUCOSE-CAPILLARY: 168 mg/dL — AB (ref 65–99)
GLUCOSE-CAPILLARY: 179 mg/dL — AB (ref 65–99)
GLUCOSE-CAPILLARY: 188 mg/dL — AB (ref 65–99)
GLUCOSE-CAPILLARY: 193 mg/dL — AB (ref 65–99)
GLUCOSE-CAPILLARY: 260 mg/dL — AB (ref 65–99)
Glucose-Capillary: 106 mg/dL — ABNORMAL HIGH (ref 65–99)
Glucose-Capillary: 112 mg/dL — ABNORMAL HIGH (ref 65–99)
Glucose-Capillary: 158 mg/dL — ABNORMAL HIGH (ref 65–99)
Glucose-Capillary: 147 mg/dL — ABNORMAL HIGH (ref 65–99)
Glucose-Capillary: 209 mg/dL — ABNORMAL HIGH (ref 65–99)
Glucose-Capillary: 175 mg/dL — ABNORMAL HIGH (ref 65–99)
Glucose-Capillary: 361 mg/dL — ABNORMAL HIGH (ref 65–99)

## 2015-12-21 LAB — MRSA PCR SCREENING: MRSA by PCR: NEGATIVE

## 2015-12-21 MED ORDER — INSULIN ASPART 100 UNIT/ML ~~LOC~~ SOLN
0.0000 [IU] | Freq: Three times a day (TID) | SUBCUTANEOUS | Status: DC
  Administered 2015-12-21: 3 [IU] via SUBCUTANEOUS
  Administered 2015-12-21: 2 [IU] via SUBCUTANEOUS
  Administered 2015-12-22: 5 [IU] via SUBCUTANEOUS
  Administered 2015-12-22: 7 [IU] via SUBCUTANEOUS
  Administered 2015-12-22: 3 [IU] via SUBCUTANEOUS

## 2015-12-21 MED ORDER — INSULIN GLARGINE 100 UNIT/ML ~~LOC~~ SOLN
14.0000 [IU] | Freq: Every day | SUBCUTANEOUS | Status: DC
  Administered 2015-12-21 – 2015-12-22 (×2): 14 [IU] via SUBCUTANEOUS
  Filled 2015-12-21 (×4): qty 0.14

## 2015-12-21 MED ORDER — INSULIN STARTER KIT- SYRINGES (ENGLISH)
1.0000 | Freq: Once | Status: AC
  Administered 2015-12-21: 1
  Filled 2015-12-21: qty 1

## 2015-12-21 MED ORDER — INSULIN ASPART 100 UNIT/ML ~~LOC~~ SOLN
0.0000 [IU] | Freq: Every day | SUBCUTANEOUS | Status: DC
  Administered 2015-12-21: 5 [IU] via SUBCUTANEOUS

## 2015-12-21 NOTE — Progress Notes (Addendum)
BP 182/95 patient has no complaints. Message sent to Dr. Sarajane Jews. Dr Sarajane Jews ok with monitoring BP for now.

## 2015-12-21 NOTE — Plan of Care (Signed)
Problem: Food- and Nutrition-Related Knowledge Deficit (NB-1.1) Goal: Nutrition education Formal process to instruct or train a patient/client in a skill or to impart knowledge to help patients/clients voluntarily manage or modify food choices and eating behavior to maintain or improve health. Outcome: Adequate for Discharge  RD consulted for nutrition education regarding diabetes. He is newly diagnosed. No results found for: HGBA1C  RD provided "Basic Carbohydrate Counting, Sample Menu plan, Grocery Shopping List " handouts. Discussed different food groups and their effects on blood sugar (such as protein foods  vs regular fruit juice), emphasizing carbohydrate-containing foods. Provided review of foods that are considered rich in carbohydrates and discussed the recommended serving sizes of common foods.  Discussed importance of controlled and consistent carbohydrate intake throughout the day. Prior to hospitalization pt says he only eats one meal daily. He works 12 hr shift and packs his lunch (usually bologna and cheese, chips or beanie weenies). His reported beverage of choice: fruit juices. We talked about comparable alternatives such as transitioning to drinking more water and informed him of the line of low calorie fruit juices. Encouraged him to note the amounts and types of foods we send on his cho modified trays to help him learn the appropriate portions and types of foods in balanced cho mod meal (60-75 gr cho per meal).    Provided examples of ways to balance meals/snacks and encouraged intake of high-fiber, whole grain complex carbohydrates. Teach back method used.  Expect fair compliance since this is a new diagnosis and pt reluctantly allowed me to review the education materials noted above.  Body mass index is 24.74 kg/(m^2). Pt meets criteria for normal range based on current BMI.  Current diet order is CHO Modified, patient is consuming approximately 50-75% of meals at this time.  Labs and medications reviewed.   No further nutrition interventions warranted at this time. He is to follow up with outpatient RD which will be benefical for additional counseling and support for his new diagnosis and encouraging him to make long term lifestyle changes.  Colman Cater MS,RD,CSG,LDN Office: [phone] Pager: [phone]

## 2015-12-21 NOTE — Progress Notes (Signed)
Inpatient Diabetes Program Recommendations  AACE/ADA: New Consensus Statement on Inpatient Glycemic Control (2015)  Target Ranges:  Prepandial:   less than 140 mg/dL      Peak postprandial:   less than 180 mg/dL (1-2 hours)      Critically ill patients:  140 - 180 mg/dL   Results for Todd [name], Todd [name] (MRN [id number]) as of 12/21/2015 12:58  Ref. Range 12/20/2015 09:34  Glucose Latest Ref Range: 65-99 mg/dL 795 (HH)    Admit: Hyperosmolar Hyperglycemia without ketosis. New diagnosis Diabetes  Current Orders: Lantus 14 units daily     Novolog Sensitive Correction Scale/ SSI (0-9 units) TID AC + HS      -Note patient with new diagnosis of DM.  Transitioned off IV Insulin drip this AM to Lantus and Novolog.  -Current A1c results pending.  -Called patient by phone (DM Coordinator not physically present on [facility] today) to discuss new diagnosis.  Patient stated he felt "bad" and didn't really feel like talking with me.  Asked patient if he had any relatives with diabetes.  He stated "yes" and then told me this was a new diagnosis for him.  Attempted to explain to patient the treatments we have been giving him and the basic pathophysiology behind diabetes but patient sounded very disinterested on the phone.  Explained to patient that the nurses will be working with him to learn how to check his fingerstick glucose levels and how to take insulin at home.  Patient stated to me that the RN had already told him that.  -Note RD visited with patient earlier today.  Asked patient if he had any questions so far about what the dietitian reviewed with him or any questions in general about diabetes.  Patient stated "No".  I encouraged the patient to ask the RN any questions that may arise.  Will follow and attempt to call patient again tomorrow.    --Will follow patient during hospitalization--  Wyn Quaker RN, MSN, CDE Diabetes Coordinator Inpatient Glycemic Control Team Team Pager:  [phone] (8a-5p)

## 2015-12-21 NOTE — Discharge Summary (Addendum)
Physician Discharge Summary  Todd [name] [id number] DOB: 04-Aug-1955 DOA: 12/20/2015  PCP: Todd Gainer, MD  Admit date: 12/20/2015 Discharge date: 12/22/2015  Recommendations for Outpatient Follow-up:  1. Follow-up new diagnosis of diabetes mellitus, started on insulin. Consider addition of ACE inhibitor. 2. Referred for outpatient diabetic education    Follow-up Information    Follow up with Todd Fraise, MD On 12/28/2015.   Specialty:  Family Medicine   Why:  9:10 AM   Contact information:   [place] [address] [phone]      Discharge Diagnoses:  1. Hyperosmolar hyperglycemia without ketosis 2. Diabetes mellitus type 2 3. Acute kidney injury  4. Oral candidiasis   Discharge Condition: improved Disposition: discharge home  Diet recommendation: carb modified  Filed Weights   12/20/15 0839 12/20/15 1446 12/21/15 0500  Weight: 81.647 kg (180 lb) 73.1 kg (161 lb 2.5 oz) 73.8 kg (162 lb 11.2 oz)    History of present illness:  60 year old male presented with complaints of a sore throat. While in the ED, he was noted to have an elevated blood glucose of 795 and was admitted to SDU for management of hyperosmolar hyperglycemia.   Hospital Course:  Patient was treated with IV insulin with rapid improvement and transition to subcutaneous long-acting insulin. This was a new diagnosis of diabetes. He underwent extensive teaching and demonstrated competency and administration of insulin and reports he will be compliant with recommendations. Referral was given for outpatient diabetes education. Acute kidney injury resolved with fluids. Hospitalization was uncomplicated.   Hyperosmolar hyperglycemia without ketosis. Anion gap normal. Blood sugar stable. Secondary to new onset diabetes mellitus. Now controlled.  Diabetes mellitus type 2. New diagnosis. Hemoglobin A1c 14.3.  Acute kidney injury secondary to dehydration from profound hyperglycemia.    Thrush secondary most likely to hyperglycemia. Appears resolved. HIV negative.  Elevated lipase without abdominal pain. Resolved. Secondary to acute illness. No evidence of pancreatitis.  Post-op hypothyroid. TSH within normal limits.  Gout, quiescent  Consultants:  none  Procedures:  none  Antimicrobials:  None  Discharge Instructions  Discharge Instructions    Activity as tolerated - No restrictions    Complete by:  As directed      Amb Referral to Nutrition and Diabetic E    Complete by:  As directed   New diagnosis of DM.  Patient from [facility].  Could he see Todd [name] with Dr. Liliane [name] office for Outpatient DM Education?  Thanks!     Ambulatory referral to Nutrition and Diabetic Education    Complete by:  As directed   New DM dx; new to insulin     Diet Carb Modified    Complete by:  As directed      Discharge instructions    Complete by:  As directed   Call your physician or seek immediate medical attention for fatigue, weakness, blood sugar more than 400 or less than 80. Keep a notebook and record all your blood sugars and bring to all appointments. Check your blood sugars at least 2 times per day (breakfast, lunch, dinner, bedtime).          Current Discharge Medication List    START taking these medications   Details  insulin glargine (LANTUS) 100 UNIT/ML injection Inject 0.2 mLs (20 Units total) into the skin daily. Qty: 10 mL, Refills: 0    nystatin (MYCOSTATIN) 100000 UNIT/ML suspension Take 5 mLs (500,000 Units total) by mouth 4 (four) times daily. Qty: 60 mL, Refills:  0      CONTINUE these medications which have NOT CHANGED   Details  levothyroxine (SYNTHROID, LEVOTHROID) 112 MCG tablet Take 112 mcg by mouth daily before breakfast.       No Known Allergies  The results of significant diagnostics from this hospitalization (including imaging, microbiology, ancillary and laboratory) are listed below for reference.    Significant Diagnostic  Studies: Dg Chest 2 View  12/20/2015  CLINICAL DATA:  Dysphagia and sore throat EXAM: CHEST  2 VIEW COMPARISON:  Oct 15, 2010 FINDINGS: There is scarring in the right lung base. Lungs elsewhere clear. Heart size and pulmonary vascularity are normal. No adenopathy. There is atherosclerotic calcification in the aortic arch region. No bone lesions are evident. There are surgical clips at the cervical -thoracic junction anteriorly. IMPRESSION: Scarring right base. No edema or consolidation. Aortic atherosclerosis. Electronically Signed   By: Todd [name] M.D.   On: 12/20/2015 09:50    Microbiology: Recent Results (from the past 240 hour(s))  MRSA PCR Screening     Status: None   Collection Time: 12/21/15  4:00 PM  Result Value Ref Range Status   MRSA by PCR NEGATIVE NEGATIVE Final    Comment:        The GeneXpert MRSA Assay (FDA approved for NASAL specimens only), is one component of a comprehensive MRSA colonization surveillance program. It is not intended to diagnose MRSA infection nor to guide or monitor treatment for MRSA infections.      Labs: Basic Metabolic Panel:  Recent Labs Lab 12/20/15 0934 12/21/15 0254 12/21/15 0458  NA 125* 136 136  K 4.6 3.1* 3.1*  CL 86* 105 107  CO2 24 25 23   GLUCOSE 795* 126* 108*  BUN 69* 43* 40*  CREATININE 2.16* 1.29* 1.34*  CALCIUM 9.0 7.9* 7.7*   Liver Function Tests:  Recent Labs Lab 12/20/15 0934  AST 31  ALT 33  ALKPHOS 130*  BILITOT 1.6*  PROT 10.4*  ALBUMIN 4.0    Recent Labs Lab 12/20/15 0934  LIPASE 69*   CBC:  Recent Labs Lab 12/20/15 0934  WBC 11.0*  NEUTROABS 8.6*  HGB 18.0*  HCT 51.5  MCV 86.8  PLT 705*     CBG:  Recent Labs Lab 12/21/15 1618 12/21/15 2157 12/22/15 0745 12/22/15 1126 12/22/15 1554  GLUCAP 168* 361* 239* 263* 307*    Principal Problem:   Uncontrolled type 2 diabetes mellitus with hyperosmolar nonketotic hyperglycemia (HCC) Active Problems:   Diabetes mellitus  ([employer])   AKI (acute kidney injury) ([place])   Oral candidiasis   Time coordinating discharge: 35 minutes  Signed:  [name], MD [facility] 12/22/2015, 4:38 PM  By signing my name below, I, Todd [name], attest that this documentation has been prepared under the direction and in the presence of Heber Hoog P. Sarajane Jews, MD. Electronically Signed: Delene [name], Scribe.  12/22/2015  I personally performed the services described in this documentation. All medical record entries made by the scribe were at my direction. I have reviewed the chart and agree that the record reflects my personal performance and is accurate and complete. Todd Hodgkins, MD

## 2015-12-21 NOTE — Care Management Note (Signed)
Case Management Note  Patient Details  Name: Todd [name] MRN: [id number] Date of Birth: 10/13/1955  Subjective/Objective:                  Pt is from home, admitted for uncontrolled DM. This is a new diagnosis for pt. Pt is employed and insured. Pt has PCP and drives himself to appointments. Pt will need Rx for DM supplies. Pt interested in OP CM classes if he can work around his work schedule. CM will make referral. Not appropriate for [facility] services due to non-homebound status.   Action/Plan: No further CM needs.   Expected Discharge Date:     12/22/2015             Expected Discharge Plan:  Home/Self Care  In-House Referral:  NA  Discharge planning Services  CM Consult  Post Acute Care Choice:  NA Choice offered to:  NA  DME Arranged:    DME Agency:     HH Arranged:    HH Agency:     Status of Service:  Completed, signed off  If discussed at [employer] of Stay Meetings, dates discussed:    Additional Comments:  Sherald Barge, RN 12/21/2015, 2:32 PM

## 2015-12-21 NOTE — Progress Notes (Signed)
PROGRESS NOTE  Todd [name] [id number] DOB: 1956-01-16 DOA: 12/20/2015 PCP: Todd Gainer, MD  Brief Narrative: 60 year old male presented with complaints of a sore throat. While in the ED, he was noted to have an elevated blood glucose of 795 and was admitted to SDU for management of hyperosmolar hyperglycemia. He was started on insulin drip with improvement of his sugars. Will transfer to floor.  Assessment/Plan: 1. Hyperosmolar hyperglycemia without ketosis. Anion gap normal. Blood sugar is now stable. Secondary to new onset diabetes mellitus. 2. Diabetes mellitus type 2. New diagnosis. Hemoglobin A1c pending. 3. Acute kidney injury secondary to dehydration from profound hyperglycemia. Likely resolved. Suspect chronic kidney disease stage III. 4. Thrush secondary most likely to hyperglycemia. Improving. Screening HIV. 5. Elevated lipase without abdominal pain. Secondary to acute illness. No evidence of pancreatitis. 6. Post-op hypothyroid. TSH within normal limits. 7. Gout, quiescent   Overall improved. Will transfer to floor. Transition to SQ insulin. Continue teaching. Anticipate discharge next 48 hrs.   Continue IV hydration. Follow up A1c.   Continue Nystatin oral  DVT prophylaxis: Lovenox Code Status: Full Family Communication: Discussed with patient, no family present Disposition Plan:  Discharge home once improved   [name], MD  [facility] Direct contact: [phone] --Via [place]  --[email or web address]; password [email or web address]  7PM-7AM contact night coverage as above 12/21/2015, 6:14 AM  LOS: 1 day   Consultants:  none  Procedures:  none  Antimicrobials:  none  HPI/Subjective: Feels roughly the same. No nausea or vomiting. Eating. Is urinating. Mouth feels better.  Objective: Filed Vitals:   12/21/15 0200 12/21/15 0300 12/21/15 0400 12/21/15 0500  BP:      Pulse: 63 71 64 68  Temp:   97.8 F (36.6 C)   TempSrc:   Oral   Resp: 12 16 19  18   Height:      Weight:    73.8 kg (162 lb 11.2 oz)  SpO2: 96% 98% 97% 98%    Intake/Output Summary (Last 24 hours) at 12/21/15 0614 Last data filed at 12/21/15 0500  Gross per 24 hour  Intake 1811.67 ml  Output    400 ml  Net 1411.67 ml     Filed Weights   12/20/15 0839 12/20/15 1446 12/21/15 0500  Weight: 81.647 kg (180 lb) 73.1 kg (161 lb 2.5 oz) 73.8 kg (162 lb 11.2 oz)    Exam:    Constitutional:  . Appears calm and comfortable Eyes:  . PERRL and irises appear normal . Conjunctivae and lids appear normal ENMT:  . grossly normal hearing  . Oropharynx: mucosa, tongue,posterior pharynx appear much improved, minimal white exudate. Respiratory:  . CTA bilaterally, no w/r/r.  . Respiratory effort normal. No retractions or accessory muscle use Cardiovascular:  . RRR, no m/r/g . No LE extremity edema   Musculoskeletal:  o Moves all extremities. Psychiatric:  . judgement and insight appear normal . Mental status o Mood, affect appropriate  I have personally reviewed following labs and imaging studies:   CBGs <200  Potassium 3.1  BUN 40, creatinine 1.34 stable.  Anion gap 6  TSH WNL  Scheduled Meds: . enoxaparin (LOVENOX) injection  40 mg Subcutaneous Q24H  . insulin regular  0-10 Units Intravenous TID WC  . levothyroxine  112 mcg Oral QAC breakfast  . living well with diabetes book   Does not apply Once  . nystatin  5 mL Oral QID  . sodium chloride flush  3 mL Intravenous Q12H   Continuous  Infusions: . sodium chloride Stopped (12/20/15 1938)  . dextrose 5 % and 0.45% NaCl 125 mL/hr at 12/20/15 1938  . insulin (NOVOLIN-R) infusion      Principal Problem:   Uncontrolled type 2 diabetes mellitus with hyperosmolar nonketotic hyperglycemia (HCC) Active Problems:   Diabetes mellitus ([place])   AKI (acute kidney injury) ([place])   Oral candidiasis   LOS: 1 day   Time spent 25 minutes  By signing my name below, I, Delene Ruffini, attest that this  documentation has been prepared under the direction and in the presence of Lindy Pennisi P. Sarajane Jews, MD. Electronically Signed: Delene Ruffini, Scribe.  12/21/2015 8:55am    I personally performed the services described in this documentation. All medical record entries made by the scribe were at my direction. I have reviewed the chart and agree that the record reflects my personal performance and is accurate and complete. [name], MD

## 2015-12-22 DIAGNOSIS — N179 Acute kidney failure, unspecified: Secondary | ICD-10-CM | POA: Diagnosis not present

## 2015-12-22 DIAGNOSIS — B370 Candidal stomatitis: Secondary | ICD-10-CM | POA: Diagnosis not present

## 2015-12-22 DIAGNOSIS — E1100 Type 2 diabetes mellitus with hyperosmolarity without nonketotic hyperglycemic-hyperosmolar coma (NKHHC): Secondary | ICD-10-CM | POA: Diagnosis not present

## 2015-12-22 LAB — GLUCOSE, CAPILLARY
GLUCOSE-CAPILLARY: 239 mg/dL — AB (ref 65–99)
Glucose-Capillary: 263 mg/dL — ABNORMAL HIGH (ref 65–99)
Glucose-Capillary: 307 mg/dL — ABNORMAL HIGH (ref 65–99)

## 2015-12-22 LAB — HEMOGLOBIN A1C
Hgb A1c MFr Bld: 14.3 % — ABNORMAL HIGH (ref 4.8–5.6)
Mean Plasma Glucose: 364 mg/dL

## 2015-12-22 LAB — HIV ANTIBODY (ROUTINE TESTING W REFLEX): HIV SCREEN 4TH GENERATION: NONREACTIVE

## 2015-12-22 MED ORDER — INSULIN GLARGINE 100 UNIT/ML ~~LOC~~ SOLN
20.0000 [IU] | Freq: Every day | SUBCUTANEOUS | Status: DC
  Filled 2015-12-22: qty 0.2

## 2015-12-22 MED ORDER — NYSTATIN 100000 UNIT/ML MT SUSP: 5.0000 mL | Freq: Four times a day (QID) | OROMUCOSAL | Status: DC

## 2015-12-22 MED ORDER — INSULIN GLARGINE 100 UNIT/ML ~~LOC~~ SOLN: 20.0000 [IU] | Freq: Every day | SUBCUTANEOUS | Status: DC

## 2015-12-22 NOTE — Progress Notes (Signed)
Pt calculated lunch dose of sliding scale insulin, was able to draw up insulin into the syringe correctly and give himself injection with minimal difficulty.

## 2015-12-22 NOTE — Progress Notes (Signed)
PROGRESS NOTE  Todd [name] [id number] DOB: 1956/05/19 DOA: 12/20/2015 PCP: Redge Gainer, MD  Brief Narrative: 60 year old male presented with complaints of a sore throat. While in the ED, he was noted to have an elevated blood glucose of 795 and was admitted to SDU for management of hyperosmolar hyperglycemia.   Assessment/Plan: 1. Hyperosmolar hyperglycemia without ketosis. Anion gap normal. Blood sugar stable. Secondary to new onset diabetes mellitus. Now controlled. 2. Diabetes mellitus type 2. New diagnosis. Hemoglobin A1c 14.3. 3. Acute kidney injury secondary to dehydration from profound hyperglycemia.  4. Thrush secondary most likely to hyperglycemia. Appears resolved. HIV negative. 5. Elevated lipase without abdominal pain. Resolved. Secondary to acute illness. No evidence of pancreatitis. 6. Post-op hypothyroid. TSH within normal limits. 7. Gout, quiescent   Overall improved. Increase to Lantus 20 units--has demonstrated competency in drawing up injection and self-administration of insulin. Patient reports he will self-admin insulin.   DVT prophylaxis: Lovenox Code Status: Full Family Communication:   Disposition Plan:  Discharge home once improved   [name], MD  [facility] Direct contact: [phone] --[place]  --[email or web address]; password [email or web address]  7PM-7AM contact night coverage as above 12/22/2015, 7:22 AM  LOS: 2 days   Consultants:  none  Procedures:  none  Antimicrobials:  none  HPI/Subjective: Feels improved. Slept and eating well. No nausea, or vomiting.   Objective: Filed Vitals:   12/21/15 1619 12/21/15 1700 12/21/15 2154 12/22/15 0627  BP:  165/81 130/80 142/77  Pulse:  54 74 68  Temp: 97.7 F (36.5 C)  98.2 F (36.8 C) 98.1 F (36.7 C)  TempSrc: Oral  Oral Oral  Resp:  16 20 20   Height:      Weight:      SpO2:  97% 100% 100%    Intake/Output Summary (Last 24 hours) at 12/22/15 0722 Last data filed at  12/22/15 0539  Gross per 24 hour  Intake 3760.5 ml  Output   1902 ml  Net 1858.5 ml     Filed Weights   12/20/15 0839 12/20/15 1446 12/21/15 0500  Weight: 81.647 kg (180 lb) 73.1 kg (161 lb 2.5 oz) 73.8 kg (162 lb 11.2 oz)    Exam:   Constitutional:  . Appears calm and comfortable ENMT:  . external ears, nose appear normal . grossly normal hearing  . Lips appear normal . Oropharynx: thrush appears resolved Respiratory:  . CTA bilaterally, no w/r/r.  . Respiratory effort normal. No retractions or accessory muscle use Cardiovascular:  . RRR, no m/r/g . No LE extremity edema    I have personally reviewed following labs and imaging studies:  CBG 263, 239, 361, stable  Scheduled Meds: . enoxaparin (LOVENOX) injection  40 mg Subcutaneous Q24H  . insulin aspart  0-5 Units Subcutaneous QHS  . insulin aspart  0-9 Units Subcutaneous TID WC  . insulin glargine  14 Units Subcutaneous Daily  . levothyroxine  112 mcg Oral QAC breakfast  . living well with diabetes book   Does not apply Once  . nystatin  5 mL Oral QID  . sodium chloride flush  3 mL Intravenous Q12H   Continuous Infusions: . sodium chloride 150 mL/hr at 12/22/15 0539    Principal Problem:   Uncontrolled type 2 diabetes mellitus with hyperosmolar nonketotic hyperglycemia (HCC) Active Problems:   Diabetes mellitus ([place])   AKI (acute kidney injury) ([place])   Oral candidiasis   LOS: 2 days      By signing my name below, I,  Delene Ruffini, attest that this documentation has been prepared under the direction and in the presence of Daniel P. Sarajane Jews, MD. Electronically Signed: Delene Ruffini, Scribe.  12/22/2015 1:50pm  I personally performed the services described in this documentation. All medical record entries made by the scribe were at my direction. I have reviewed the chart and agree that the record reflects my personal performance and is accurate and complete. [name], MD

## 2015-12-22 NOTE — Progress Notes (Signed)
Educated pt on sliding scale insulin, drawing up insulin and giving injection. Pt was able to successfully give himself his sliding scale coverage this morning.

## 2015-12-22 NOTE — Care Management Note (Signed)
Case Management Note  Patient Details  Name: Celeste Ruesga MRN: [id number] Date of Birth: Mar 11, 1956   Expected Discharge Date:      12/22/2015            Expected Discharge Plan:  Home/Self Care  In-House Referral:  NA  Discharge planning Services  CM Consult  Post Acute Care Choice:  NA Choice offered to:  NA  DME Arranged:    DME Agency:     HH Arranged:    [place] Agency:     Status of Service:  Completed, signed off  If discussed at [employer] of Stay Meetings, dates discussed:    Additional Comments: Spoke with patient about diabetes education classes. He is agreeable, referral form faxed to Diabetes Education services. Patient also given order for glucose meter and supplies to present to pharmacy. Patient states he has been giving himself insulin injections and feels comfortable.   Aly Seidenberg, Chauncey Reading, RN 12/22/2015, 10:42 AM

## 2015-12-22 NOTE — Progress Notes (Signed)
Inpatient Diabetes Program Recommendations  AACE/ADA: New Consensus Statement on Inpatient Glycemic Control (2015)  Target Ranges:  Prepandial:   less than 140 mg/dL      Peak postprandial:   less than 180 mg/dL (1-2 hours)      Critically ill patients:  140 - 180 mg/dL  Results for DONAHUE, MONDOR (MRN [id number]) as of 12/22/2015 08:19  Ref. Range 12/21/2015 08:13 12/21/2015 08:47 12/21/2015 10:12 12/21/2015 11:14 12/21/2015 12:22 12/21/2015 13:17 12/21/2015 14:26 12/21/2015 16:18 12/21/2015 21:57 12/22/2015 07:45  Glucose-Capillary Latest Ref Range: 65-99 mg/dL 158 (H) 179 (H) 260 (H) 193 (H) 147 (H) 209 (H) 175 (H) 168 (H) 361 (H) 239 (H)    Review of Glycemic Control  Current orders for Inpatient glycemic control: Lantus 14 units daily, Novolog 0-9 units TID with meals, Novolog 0-5 units QHS  Inpatient Diabetes Program Recommendations: Insulin - Basal: Please consider increasing Lantus to  20 units daily. Insulin - Meal Coverage: Please consider ordering Novolog 4 units TID with meals for meal coverage.   NURSING:  Diabetes Coordinator is not physically on [facility] campus today. Please use each patient interaction to provide diabetes education. Please review Living Well with Diabetes booklet with the patient, have patient watch patient education videos on diabetes, and instruct on insulin administration. Please allow patient to be actively engaged with diabetes management by allowing patient to check own glucose and self-administer insulin injections.   Thanks, Barnie Alderman, RN, MSN, CDE Diabetes Coordinator Inpatient Diabetes Program [phone] (Team Pager from [place] to [place]) [phone] ([facility] office) [phone] [facility] office) [phone] [facility] office)

## 2015-12-22 NOTE — Progress Notes (Signed)
[name] discharged Home per MD order.  Discharge instructions reviewed and discussed with the patient, all questions and concerns answered. Copy of instructions and scripts given to patient.    Medication List    TAKE these medications        insulin glargine 100 UNIT/ML injection  Commonly known as:  LANTUS  Inject 0.2 mLs (20 Units total) into the skin daily.  Start taking on:  12/23/2015     levothyroxine 112 MCG tablet  Commonly known as:  SYNTHROID, LEVOTHROID  Take 112 mcg by mouth daily before breakfast.     nystatin 100000 UNIT/ML suspension  Commonly known as:  MYCOSTATIN  Take 5 mLs (500,000 Units total) by mouth 4 (four) times daily.        Patients skin is clean, dry and intact, no evidence of skin break down. IV site discontinued and catheter remains intact. Site without signs and symptoms of complications. Dressing and pressure applied.  Patient escorted to car by NT in a wheelchair,  no distress noted upon discharge.  [name] 12/22/2015 6:49 PM

## 2015-12-28 ENCOUNTER — Ambulatory Visit (INDEPENDENT_AMBULATORY_CARE_PROVIDER_SITE_OTHER): Payer: BLUE CROSS/BLUE SHIELD | Admitting: Family Medicine

## 2015-12-28 ENCOUNTER — Encounter: Payer: Self-pay | Admitting: Family Medicine

## 2015-12-28 VITALS — BP 151/77 | HR 57 | Temp 97.1°F | Ht 68.0 in | Wt 168.0 lb

## 2015-12-28 DIAGNOSIS — E1122 Type 2 diabetes mellitus with diabetic chronic kidney disease: Secondary | ICD-10-CM | POA: Diagnosis not present

## 2015-12-28 DIAGNOSIS — N183 Chronic kidney disease, stage 3 unspecified: Secondary | ICD-10-CM

## 2015-12-28 DIAGNOSIS — N179 Acute kidney failure, unspecified: Secondary | ICD-10-CM

## 2015-12-28 LAB — GLUCOSE HEMOCUE WAIVED: Glu Hemocue Waived: 344 mg/dL — ABNORMAL HIGH (ref 65–99)

## 2015-12-28 MED ORDER — LISINOPRIL 10 MG PO TABS: 10.0000 mg | ORAL_TABLET | Freq: Every day | ORAL | Status: DC

## 2015-12-28 NOTE — Progress Notes (Signed)
Subjective:  Patient ID: Todd [name], male    DOB: October 05, 1955  Age: 60 y.o. MRN: [id number]  CC: Hospitalization Follow-up   HPI Tyhir Schwan presents for follow up of recent Dx of Diabetes. Started on lantus due to extreme hyperglycemia. Checking glucose at home how. Readings running in 250-350 range checking fasting. Pt. Doesn't understand how to eat.Questions whether he can have ice cream, sweets. He has eliminated sugared sodas. Still also has questions about monitoring.    History Rakan has a past medical history of Thyroid disease and Gout.   He has past surgical history that includes Thyroid surgery.   His family history includes Pneumonia in his father.He reports that he has been smoking Cigarettes.  He has been smoking about 0.50 packs per day. He has never used smokeless tobacco. He reports that he drinks alcohol. He reports that he does not use illicit drugs.    ROS Review of Systems  Constitutional: Negative for fever, chills and diaphoresis.  HENT: Negative for rhinorrhea and sore throat.   Respiratory: Negative for cough and shortness of breath.   Cardiovascular: Negative for chest pain.  Gastrointestinal: Negative for abdominal pain.  Musculoskeletal: Negative for myalgias and arthralgias.  Skin: Negative for rash.  Neurological: Negative for weakness and headaches.    Objective:  BP 151/77 mmHg  Pulse 57  Temp(Src) 97.1 F (36.2 C) (Oral)  Ht 5' 8"  (1.727 m)  Wt 168 lb (76.204 kg)  BMI 25.55 kg/m2  BP Readings from Last 3 Encounters:  12/28/15 151/77  12/22/15 152/78  05/20/15 132/75    Wt Readings from Last 3 Encounters:  12/28/15 168 lb (76.204 kg)  12/21/15 162 lb 11.2 oz (73.8 kg)  05/20/15 185 lb (83.915 kg)     Physical Exam  Constitutional: He is oriented to person, place, and time. He appears well-developed and well-nourished. No distress.  HENT:  Head: Normocephalic and atraumatic.  Right Ear: External ear normal.  Left  Ear: External ear normal.  Nose: Nose normal.  Mouth/Throat: Oropharynx is clear and moist.  Eyes: Conjunctivae and EOM are normal. Pupils are equal, round, and reactive to light.  Neck: Normal range of motion. Neck supple. No thyromegaly present.  Cardiovascular: Normal rate, regular rhythm and normal heart sounds.   No murmur heard. Pulmonary/Chest: Effort normal and breath sounds normal. No respiratory distress. He has no wheezes. He has no rales.  Abdominal: Soft. Bowel sounds are normal. He exhibits no distension. There is no tenderness.  Lymphadenopathy:    He has no cervical adenopathy.  Neurological: He is alert and oriented to person, place, and time. He has normal reflexes.  Skin: Skin is warm and dry.  Psychiatric: He has a normal mood and affect. His behavior is normal. Judgment and thought content normal.     Lab Results  Component Value Date   WBC 11.0* 12/20/2015   HGB 18.0* 12/20/2015   HCT 51.5 12/20/2015   PLT 705* 12/20/2015   GLUCOSE 108* 12/21/2015   ALT 33 12/20/2015   AST 31 12/20/2015   NA 136 12/21/2015   K 3.1* 12/21/2015   CL 107 12/21/2015   CREATININE 1.34* 12/21/2015   BUN 40* 12/21/2015   CO2 23 12/21/2015   TSH 0.694 12/20/2015   HGBA1C 14.3* 12/21/2015    Dg Chest 2 View  12/20/2015  CLINICAL DATA:  Dysphagia and sore throat EXAM: CHEST  2 VIEW COMPARISON:  Oct 15, 2010 FINDINGS: There is scarring in the right lung base. Lungs elsewhere  clear. Heart size and pulmonary vascularity are normal. No adenopathy. There is atherosclerotic calcification in the aortic arch region. No bone lesions are evident. There are surgical clips at the cervical -thoracic junction anteriorly. IMPRESSION: Scarring right base. No edema or consolidation. Aortic atherosclerosis. Electronically Signed   By: Lowella Grip III M.D.   On: 12/20/2015 09:50    Assessment & Plan:   Jermon was seen today for hospitalization follow-up.  Diagnoses and all orders for this  visit:  Type 2 diabetes mellitus with stage 3 chronic kidney disease, without long-term current use of insulin (HCC) -     Glucose Hemocue Waived -     CMP14+EGFR  AKI (acute kidney injury) ([place])  Other orders -     lisinopril (PRINIVIL,ZESTRIL) 10 MG tablet; Take 1 tablet (10 mg total) by mouth daily.  Detailed discussion of carbs- portion size, simple vs. Complex, protein sources etc.  Appt. Arranged with Cherre Robins, DM educator. Instruction given on proper use of glucose monitor. Check Fasting & PP. Bring readings to next visit.   I have discontinued Mr. Arlotta nystatin. I am also having him start on lisinopril. Additionally, I am having him maintain his levothyroxine and insulin glargine.  Meds ordered this encounter  Medications  . lisinopril (PRINIVIL,ZESTRIL) 10 MG tablet    Sig: Take 1 tablet (10 mg total) by mouth daily.    Dispense:  90 tablet    Refill:  3     Follow-up: Return in about 2 weeks (around 01/11/2016).  [name], M.D.

## 2015-12-29 ENCOUNTER — Other Ambulatory Visit: Payer: Self-pay | Admitting: *Deleted

## 2015-12-29 LAB — CMP14+EGFR
A/G RATIO: 0.8 — AB (ref 1.2–2.2)
ALBUMIN: 3.7 g/dL (ref 3.6–4.8)
ALT: 25 IU/L (ref 0–44)
AST: 21 IU/L (ref 0–40)
Alkaline Phosphatase: 127 IU/L — ABNORMAL HIGH (ref 39–117)
BILIRUBIN TOTAL: 0.5 mg/dL (ref 0.0–1.2)
BUN / CREAT RATIO: 10 (ref 10–24)
BUN: 11 mg/dL (ref 8–27)
CHLORIDE: 95 mmol/L — AB (ref 96–106)
CO2: 22 mmol/L (ref 18–29)
Calcium: 9.2 mg/dL (ref 8.6–10.2)
Creatinine, Ser: 1.13 mg/dL (ref 0.76–1.27)
GFR calc non Af Amer: 70 mL/min/{1.73_m2} (ref >59)
GFR, EST AFRICAN AMERICAN: 81 mL/min/{1.73_m2} (ref >59)
Globulin, Total: 4.6 g/dL — ABNORMAL HIGH (ref 1.5–4.5)
Glucose: 441 mg/dL (ref 65–99)
POTASSIUM: 4.6 mmol/L (ref 3.5–5.2)
Sodium: 133 mmol/L — ABNORMAL LOW (ref 134–144)
Total Protein: 8.3 g/dL (ref 6.0–8.5)

## 2016-01-05 ENCOUNTER — Ambulatory Visit (INDEPENDENT_AMBULATORY_CARE_PROVIDER_SITE_OTHER): Payer: BLUE CROSS/BLUE SHIELD | Admitting: Pharmacist

## 2016-01-05 ENCOUNTER — Encounter: Payer: Self-pay | Admitting: Pharmacist

## 2016-01-05 VITALS — BP 136/75 | HR 74 | Ht 68.0 in | Wt 170.5 lb

## 2016-01-05 DIAGNOSIS — I10 Essential (primary) hypertension: Secondary | ICD-10-CM | POA: Diagnosis not present

## 2016-01-05 DIAGNOSIS — E1122 Type 2 diabetes mellitus with diabetic chronic kidney disease: Secondary | ICD-10-CM | POA: Diagnosis not present

## 2016-01-05 DIAGNOSIS — N183 Chronic kidney disease, stage 3 (moderate): Secondary | ICD-10-CM | POA: Diagnosis not present

## 2016-01-05 DIAGNOSIS — I129 Hypertensive chronic kidney disease with stage 1 through stage 4 chronic kidney disease, or unspecified chronic kidney disease: Secondary | ICD-10-CM

## 2016-01-05 DIAGNOSIS — IMO0002 Reserved for concepts with insufficient information to code with codable children: Secondary | ICD-10-CM | POA: Insufficient documentation

## 2016-01-05 DIAGNOSIS — E1165 Type 2 diabetes mellitus with hyperglycemia: Secondary | ICD-10-CM | POA: Insufficient documentation

## 2016-01-05 MED ORDER — INSULIN GLARGINE 100 UNIT/ML ~~LOC~~ SOLN: SUBCUTANEOUS | 0 refills | Status: DC

## 2016-01-05 NOTE — Progress Notes (Signed)
Subjective:    Todd [name] is a 60 y.o. male who presents for an initial evaluation of diabetes mellitus - mostly likely type 2.  Dr Livia Snellen had wanted to add C-Peptide to labs checked 12/28/15 but unable to add.     Current symptoms/problems include hyperglycemia and visual disturbances.  At time of diagnosis which was 12/20/2015 patient was having polyuria, polydipsia and also weight loss (weight decreased from 185 to 168 from December 2016 to July 2017.   Known diabetic complications: none Cardiovascular risk factors: advanced age (older than 41 for men, 84 for women), diabetes mellitus, family history of premature cardiovascular disease, hypertension, male gender and smoking/ tobacco exposure Current diabetic medications include lantus 30 units qam.   Eye exam current (within one year): no Weight trend: stable - over lst 10 days Prior visit with dietician: no Current diet: in general, an "unhealthy" diet Current exercise: none  Current monitoring regimen: home blood tests - 1-2 times daily Home blood sugar records: ranges from 164 to 225 Any episodes of hypoglycemia? no  Is He on ACE inhibitor or angiotensin II receptor blocker?  Yes  lisinopril (Prinivil)    The following portions of the patient's history were reviewed and updated as appropriate: allergies, current medications, past family history, past medical history, past social history, past surgical history and problem list.    Objective:    BP 136/75 (BP Location: Left Arm, Patient Position: Sitting, Cuff Size: Normal)   Pulse 74   Ht '5\' 8"'$  (1.727 m)   Wt 170 lb 8 oz (77.3 kg)   BMI 25.92 kg/m   A1c = 14.3% (07/10/217) RBG today in office was 288  Lab Review Glucose (mg/dL)  Date Value  12/28/2015 441 (>)   Glucose, Bld (mg/dL)  Date Value  12/21/2015 108 (H)  12/21/2015 126 (H)  12/20/2015 795 (HH)   CO2 (mmol/L)  Date Value  12/28/2015 22  12/21/2015 23  12/21/2015 25   BUN (mg/dL)  Date Value   12/28/2015 11  12/21/2015 40 (H)  12/21/2015 43 (H)  12/20/2015 69 (H)   Creatinine, Ser (mg/dL)  Date Value  12/28/2015 1.13  12/21/2015 1.34 (H)  12/21/2015 1.29 (H)    Assessment:    Diabetes Mellitus type (likely type 2 but currently requiring insulin) under inadequate control.    Plan:    1.  Rx changes: Increase Lantus to 35 units qam for next 2 days, if BG is still over 150 in am on 01/08/16 then increase to 38 units qam   Recommend checking C-Peptide at next visit (since BG is over 250 today may  Not get accurate results)  If C- Peptide is WNL then consider adding metformin or SGLT2, GLP1 or DPP4 agent.  If C- Peptide is low then consider adding short acting insulin prior to meals. 2.  Education: Reviewed 'ABCs' of diabetes management (respective goals in parentheses):  A1C (<7), blood pressure (<130/80), and cholesterol (LDL <100). 3. Discussed pathophysiology of DM; difference between type 1 and type 2 DM. 4. CHO counting diet discussed.  Reviewed CHO amount in various foods and how to read nutrition labels.  Discussed recommended serving sizes.  5.  Recommended increase physical activity - goal is 150 minutes per week 6.  Discussed smoking cessation and available pharmacotherapy option to help - patient declined.  Also discussed triggers for smoking and how to change or avoid. 7.  Continue to check BG up to bid - rx given for test strip and lancets (written).  Discussed BG goals.  8. Follow up: 6 days - with PCP as already planned.  I will see patient again in 1 month

## 2016-01-05 NOTE — Patient Instructions (Addendum)
Increase Lantus to 35 units once each morning.  Continue to check blood glucose 1 to 2 times a day.  If on Friday, July 28th your blood glucose if still over 150 in the morning then increase Lantus to 38 units each morning.     Diabetes and Standards of Medical Care   Diabetes is complicated. You may find that your diabetes team includes a dietitian, nurse, diabetes educator, eye doctor, and more. To help everyone know what is going on and to help you get the care you deserve, the following schedule of care was developed to help keep you on track. Below are the tests, exams, vaccines, medicines, education, and plans you will need.  Blood Glucose Goals Prior to meals = 80 - 130 Within 2 hours of the start of a meal = less than 180  HbA1c test (goal is less than 7.0% - your last value was 14.3%) This test shows how well you have controlled your glucose over the past 2 to 3 months. It is used to see if your diabetes management plan needs to be adjusted.   It is performed at least 2 times a year if you are meeting treatment goals.  It is performed 4 times a year if therapy has changed or if you are not meeting treatment goals.  Blood pressure test  This test is performed at every routine medical visit. The goal is less than 140/90 mmHg for most people, but 130/80 mmHg in some cases. Ask your health care provider about your goal.  Dental exam  Follow up with the dentist regularly.  Eye exam  If you are diagnosed with type 1 diabetes as a child, get an exam upon reaching the age of 71 years or older and have had diabetes for 3 to 5 years. Yearly eye exams are recommended after that initial eye exam.  If you are diagnosed with type 1 diabetes as an adult, get an exam within 5 years of diagnosis and then yearly.  If you are diagnosed with type 2 diabetes, get an exam as soon as possible after the diagnosis and then yearly.  Foot care exam  Visual foot exams are performed at every routine  medical visit. The exams check for cuts, injuries, or other problems with the feet.  A comprehensive foot exam should be done yearly. This includes visual inspection as well as assessing foot pulses and testing for loss of sensation.  Check your feet nightly for cuts, injuries, or other problems with your feet. Tell your health care provider if anything is not healing.  Kidney function test (urine microalbumin)  This test is performed once a year.  Type 1 diabetes: The first test is performed 5 years after diagnosis.  Type 2 diabetes: The first test is performed at the time of diagnosis.  A serum creatinine and estimated glomerular filtration rate (eGFR) test is done once a year to assess the level of chronic kidney disease (CKD), if present.  Lipid profile (cholesterol, HDL, LDL, triglycerides)  Performed every 5 years for most people.  The goal for LDL is less than 100 mg/dL. If you are at high risk, the goal is less than 70 mg/dL.  The goal for HDL is 40 mg/dL to 50 mg/dL for men and 50 mg/dL to 60 mg/dL for women. An HDL cholesterol of 60 mg/dL or higher gives some protection against heart disease.  The goal for triglycerides is less than 150 mg/dL.  Influenza vaccine, pneumococcal vaccine, and hepatitis B  vaccine  The influenza vaccine is recommended yearly.  The pneumococcal vaccine is generally given once in a lifetime. However, there are some instances when another vaccination is recommended. Check with your health care provider.  The hepatitis B vaccine is also recommended for adults with diabetes.  Diabetes self-management education  Education is recommended at diagnosis and ongoing as needed.  Treatment plan  Your treatment plan is reviewed at every medical visit.  Document Released: 03/27/2009 Document Revised: 01/30/2013 Document Reviewed: 10/30/2012 [facility] Patient Information 2014 [place].

## 2016-01-06 ENCOUNTER — Ambulatory Visit: Payer: BLUE CROSS/BLUE SHIELD | Admitting: Nutrition

## 2016-01-11 ENCOUNTER — Ambulatory Visit (INDEPENDENT_AMBULATORY_CARE_PROVIDER_SITE_OTHER): Payer: BLUE CROSS/BLUE SHIELD | Admitting: Family Medicine

## 2016-01-11 ENCOUNTER — Encounter: Payer: Self-pay | Admitting: Family Medicine

## 2016-01-11 VITALS — BP 116/67 | HR 65 | Temp 97.7°F | Ht 68.0 in | Wt 171.2 lb

## 2016-01-11 DIAGNOSIS — E1122 Type 2 diabetes mellitus with diabetic chronic kidney disease: Secondary | ICD-10-CM | POA: Diagnosis not present

## 2016-01-11 DIAGNOSIS — N183 Chronic kidney disease, stage 3 (moderate): Secondary | ICD-10-CM

## 2016-01-11 DIAGNOSIS — I10 Essential (primary) hypertension: Secondary | ICD-10-CM | POA: Diagnosis not present

## 2016-01-11 MED ORDER — INSULIN GLARGINE 100 UNIT/ML SOLOSTAR PEN: 35.0000 [IU] | PEN_INJECTOR | Freq: Every day | SUBCUTANEOUS | 99 refills | Status: DC

## 2016-01-11 NOTE — Progress Notes (Signed)
Subjective:  Patient ID: Todd [name], male    DOB: 1956/03/16  Age: 60 y.o. MRN: [id number]  CC: Diabetes (2 wk rck)   HPI Todd [name] presents for follow up of recent Dx of Diabetes. Started on lantus due to extreme hyperglycemia. Checking glucose at home how. Readings running in 250-350 rangeThe first several days. But have consistently dropped over the last several days to the 95 210 range fasting and 107-180 postprandial.. Pt. Doesn't understand how to eat.she saw Ms. Eckard and education was given. He seems to understand better now. Due to his glucose of 95 this morning we discussed also hypoglycemic reactions. He denies having had any of those.   follow-up of hypertension. Patient has no history of headache chest pain or shortness of breath or recent cough. Patient also denies symptoms of TIA such as numbness weakness lateralizing.  Patient denies side effects from his medication. States taking it regularly.  History Todd [name] has a past medical history of Diabetes mellitus without complication ([place]); Goiter; Gout; Hyperlipidemia; Hypertension; and Thyroid disease.   He has a past surgical history that includes Thyroid surgery.   His family history includes Diabetes in his maternal aunt and maternal uncle; Heart disease in his mother; Pneumonia in his father.He reports that he has been smoking Cigarettes.  He has been smoking about 0.50 packs per day. He has never used smokeless tobacco. He reports that he drinks alcohol. He reports that he does not use drugs.    ROS Review of Systems  Constitutional: Negative for chills, diaphoresis and fever.  HENT: Negative for rhinorrhea and sore throat.   Respiratory: Negative for cough and shortness of breath.   Cardiovascular: Negative for chest pain.  Gastrointestinal: Negative for abdominal pain.  Musculoskeletal: Negative for arthralgias and myalgias.  Skin: Negative for rash.  Neurological: Negative for weakness and headaches.      Objective:  BP 116/67 (BP Location: Left Arm, Patient Position: Sitting, Cuff Size: Normal)   Pulse 65   Temp 97.7 F (36.5 C) (Oral)   Ht 5' 8"  (1.727 m)   Wt 171 lb 3.2 oz (77.7 kg)   SpO2 98%   BMI 26.03 kg/m   BP Readings from Last 3 Encounters:  01/11/16 116/67  01/05/16 136/75  12/28/15 (!) 151/77    Wt Readings from Last 3 Encounters:  01/11/16 171 lb 3.2 oz (77.7 kg)  01/05/16 170 lb 8 oz (77.3 kg)  12/28/15 168 lb (76.2 kg)     Physical Exam  Constitutional: He is oriented to person, place, and time. He appears well-developed and well-nourished. No distress.  HENT:  Head: Normocephalic and atraumatic.  Right Ear: External ear normal.  Left Ear: External ear normal.  Nose: Nose normal.  Mouth/Throat: Oropharynx is clear and moist.  Eyes: Conjunctivae and EOM are normal. Pupils are equal, round, and reactive to light.  Neck: Normal range of motion. Neck supple. No thyromegaly present.  Cardiovascular: Normal rate, regular rhythm and normal heart sounds.   No murmur heard. Pulmonary/Chest: Effort normal and breath sounds normal. No respiratory distress. He has no wheezes. He has no rales.  Abdominal: Soft. Bowel sounds are normal. He exhibits no distension. There is no tenderness.  Lymphadenopathy:    He has no cervical adenopathy.  Neurological: He is alert and oriented to person, place, and time. He has normal reflexes.  Skin: Skin is warm and dry.  Psychiatric: He has a normal mood and affect. His behavior is normal. Judgment and thought content normal.  Lab Results  Component Value Date   WBC 11.0 (H) 12/20/2015   HGB 18.0 (H) 12/20/2015   HCT 51.5 12/20/2015   PLT 705 (H) 12/20/2015   GLUCOSE 441 (>) 12/28/2015   ALT 25 12/28/2015   AST 21 12/28/2015   NA 133 (L) 12/28/2015   K 4.6 12/28/2015   CL 95 (L) 12/28/2015   CREATININE 1.13 12/28/2015   BUN 11 12/28/2015   CO2 22 12/28/2015   TSH 0.694 12/20/2015   HGBA1C 14.3 (H) 12/21/2015     Dg Chest 2 View  12/20/2015  CLINICAL DATA:  Dysphagia and sore throat EXAM: CHEST  2 VIEW COMPARISON:  Oct 15, 2010 FINDINGS: There is scarring in the right lung base. Lungs elsewhere clear. Heart size and pulmonary vascularity are normal. No adenopathy. There is atherosclerotic calcification in the aortic arch region. No bone lesions are evident. There are surgical clips at the cervical -thoracic junction anteriorly. IMPRESSION: Scarring right base. No edema or consolidation. Aortic atherosclerosis. Electronically Signed   By: Lowella Grip III M.D.   On: 12/20/2015 09:50    Assessment & Plan:   Todd [name] was seen today for diabetes.  Diagnoses and all orders for this visit:  Type 2 diabetes mellitus with stage 3 chronic kidney disease, without long-term current use of insulin (HCC) -     C-peptide -     BMP8+EGFR  Essential hypertension -     BMP8+EGFR  Other orders -     Insulin Glargine (LANTUS SOLOSTAR) 100 UNIT/ML Solostar Pen; Inject 35 Units into the skin daily.  Detailed discussion of carbs- portion size, simple vs. Complex, protein sources etc.  Appt. Arranged with Cherre Robins, DM educator. Instruction given on proper use of glucose monitor. Check Fasting & PP. Bring readings to next visit.   I am having Todd [name] start on Insulin Glargine. I am also having him maintain his levothyroxine, lisinopril, insulin glargine, and RELION INSULIN SYRINGE.  Meds ordered this encounter  Medications  . DISCONTD: RELION INSULIN SYRINGE 31G X 15/64" 1 ML MISC    Refill:  0  . RELION INSULIN SYRINGE 31G X 15/64" 1 ML MISC    Refill:  0  . Insulin Glargine (LANTUS SOLOSTAR) 100 UNIT/ML Solostar Pen    Sig: Inject 35 Units into the skin daily.    Dispense:  5 pen    Refill:  PRN     Follow-up: Return in about 1 month (around 02/11/2016).  [name], M.D.

## 2016-01-11 NOTE — Patient Instructions (Signed)

## 2016-01-12 LAB — BMP8+EGFR
BUN / CREAT RATIO: 10 (ref 10–24)
BUN: 10 mg/dL (ref 8–27)
CO2: 24 mmol/L (ref 18–29)
CREATININE: 1.02 mg/dL (ref 0.76–1.27)
Calcium: 9.9 mg/dL (ref 8.6–10.2)
Chloride: 100 mmol/L (ref 96–106)
GFR calc Af Amer: 92 mL/min/{1.73_m2} (ref >59)
GFR calc non Af Amer: 80 mL/min/{1.73_m2} (ref >59)
GLUCOSE: 130 mg/dL — AB (ref 65–99)
Potassium: 4.5 mmol/L (ref 3.5–5.2)
Sodium: 138 mmol/L (ref 134–144)

## 2016-01-12 LAB — C-PEPTIDE: C-Peptide: 4.3 ng/mL (ref 1.1–4.4)

## 2016-01-19 ENCOUNTER — Telehealth: Payer: Self-pay | Admitting: Family Medicine

## 2016-01-19 NOTE — Telephone Encounter (Signed)
Liliane Channel with [employer] called to inform he will be following pt for the next 30days

## 2016-02-16 ENCOUNTER — Ambulatory Visit: Payer: Self-pay | Admitting: Pharmacist

## 2016-02-16 ENCOUNTER — Other Ambulatory Visit (HOSPITAL_COMMUNITY): Payer: Self-pay | Admitting: Podiatry

## 2016-02-16 DIAGNOSIS — M79662 Pain in left lower leg: Principal | ICD-10-CM

## 2016-02-16 DIAGNOSIS — M79661 Pain in right lower leg: Secondary | ICD-10-CM

## 2016-02-24 ENCOUNTER — Ambulatory Visit (HOSPITAL_COMMUNITY): Admission: RE | Admit: 2016-02-24 | Payer: BLUE CROSS/BLUE SHIELD | Source: Ambulatory Visit

## 2016-02-24 ENCOUNTER — Encounter: Payer: Self-pay | Admitting: Pharmacist

## 2016-02-24 ENCOUNTER — Ambulatory Visit (INDEPENDENT_AMBULATORY_CARE_PROVIDER_SITE_OTHER): Payer: BLUE CROSS/BLUE SHIELD | Admitting: Pharmacist

## 2016-02-24 VITALS — BP 136/72 | HR 82 | Ht 68.0 in | Wt 170.8 lb

## 2016-02-24 DIAGNOSIS — E1122 Type 2 diabetes mellitus with diabetic chronic kidney disease: Secondary | ICD-10-CM | POA: Diagnosis not present

## 2016-02-24 DIAGNOSIS — N183 Chronic kidney disease, stage 3 unspecified: Secondary | ICD-10-CM

## 2016-02-24 DIAGNOSIS — I129 Hypertensive chronic kidney disease with stage 1 through stage 4 chronic kidney disease, or unspecified chronic kidney disease: Secondary | ICD-10-CM

## 2016-02-24 LAB — GLUCOSE HEMOCUE WAIVED: Glu Hemocue Waived: 149 mg/dL — ABNORMAL HIGH (ref 65–99)

## 2016-02-24 LAB — BAYER DCA HB A1C WAIVED: HB A1C: 8.7 % — AB (ref <7.0)

## 2016-02-24 MED ORDER — INSULIN GLARGINE 100 UNIT/ML SOLOSTAR PEN: 25.0000 [IU] | PEN_INJECTOR | Freq: Every day | SUBCUTANEOUS | 99 refills | Status: DC

## 2016-02-24 MED ORDER — METFORMIN HCL 500 MG PO TABS: 500.0000 mg | ORAL_TABLET | Freq: Two times a day (BID) | ORAL | 2 refills | Status: DC

## 2016-02-24 NOTE — Patient Instructions (Addendum)
Goal Blood glucose:    Fasting (before meals) = 80 to 130   Within 2 hours of eating = less than 180  Your A1c has decreased from 14.3% to 8.7% in the last 2 months.  Goal is less than 7%.  If you have more than 1 reading in a week that is less than 70 call me to adjust insulin - Justene Jensen [phone].    Try to have no more than 3 of these foods per meal:  Fruit:   1/2 cup or once piece (baseball size)- apples, pears, pineapple, peaches, oranges  1 cup - berries, melons  1/2 banana or grapefruit  Stachy Vegetables:   1/2 cup potatoes (white or sweet), corn, peas  Other starches:   1 piece of bread  1/2 cup rice or pasta  4 inch pancake    These foods you can eat more freely: Proteins:   Fish  Chicken or [place]  Beef or pork (1 or 2 servings per week)  Eggs  Nuts (peanuts, walnuts, almonds, pistachios)  Cheese  Non starchy vegetables:  Green beans  Broccoli or cauliflower  Lettuce, greens, cabbage  Brussel Sprout  Carrots  Onions and peppers  Celery  Tomatoes  Asparagus  Eggplant  Cucumbers  Squash and Zucchini   Hypoglycemia Hypoglycemia occurs when the glucose in your blood is too low. Glucose is a type of sugar that is your body's main energy source. Hormones, such as insulin and glucagon, control the level of glucose in the blood. Insulin lowers blood glucose and glucagon increases blood glucose. Having too much insulin in your blood stream, or not eating enough food containing sugar, can result in hypoglycemia. Hypoglycemia can happen to people with or without diabetes. It can develop quickly and can be a medical emergency.  CAUSES   Missing or delaying meals.  Not eating enough carbohydrates at meals.  Taking too much diabetes medicine.  Not timing your oral diabetes medicine or insulin doses with meals, snacks, and exercise.  Nausea and vomiting.  Certain medicines.  Severe illnesses, such as hepatitis, kidney disorders, and certain eating  disorders.  Increased activity or exercise without eating something extra or adjusting medicines.  Drinking too much alcohol.  A nerve disorder that affects body functions like your heart rate, blood pressure, and digestion (autonomic neuropathy).  A condition where the stomach muscles do not function properly (gastroparesis). Therefore, medicines and food may not absorb properly.  Rarely, a tumor of the pancreas can produce too much insulin. SYMPTOMS   Hunger.  Sweating (diaphoresis).  Change in body temperature.  Shakiness.  Headache.  Anxiety.  Lightheadedness.  Irritability.  Difficulty concentrating.  Dry mouth.  Tingling or numbness in the hands or feet.  Restless sleep or sleep disturbances.  Altered speech and coordination.  Change in mental status.  Seizures or prolonged convulsions.  Combativeness.  Drowsiness (lethargic).  Weakness.  Increased heart rate or palpitations.  Confusion.  Pale, gray skin color.  Blurred or double vision.  Fainting. DIAGNOSIS  A physical exam and medical history will be performed. Your caregiver may make a diagnosis based on your symptoms. Blood tests and other lab tests may be performed to confirm a diagnosis. Once the diagnosis is made, your caregiver will see if your signs and symptoms go away once your blood glucose is raised.  TREATMENT  Usually, you can easily treat your hypoglycemia when you notice symptoms.  Check your blood glucose. If it is less than 70 mg/dl, take one of  the following:   3-4 glucose tablets.    cup (4 ounces) juice.    cup regular soda.   1 cup skim milk.   -1 tube of glucose gel.   5-6 hard candies (blue bird mint or package of sweet tarts / smarties)   Avoid high-fat drinks or food that may delay a rise in blood glucose levels.  Do not take more than the recommended amount of sugary foods, drinks, gel, or tablets. Doing so will cause your blood glucose to go  too high.   Wait 10-15 minutes and recheck your blood glucose. If it is still less than 70 mg/dl or below your target range, repeat treatment.   Eat a snack if it is more than 1 hour until your next meal.  There may be a time when your blood glucose may go so low that you are unable to treat yourself at home when you start to notice symptoms. You may need someone to help you. You may even faint or be unable to swallow. If you cannot treat yourself, someone will need to bring you to the hospital.  [place]  If you have diabetes, follow your diabetes management plan by:  Taking your medicines as directed.  Following your exercise plan.  Following your meal plan. Do not skip meals. Eat on time.  Testing your blood glucose regularly. Check your blood glucose before and after exercise. If you exercise longer or different than usual, be sure to check blood glucose more frequently.  Wearing your medical alert jewelry that says you have diabetes.  Identify the cause of your hypoglycemia. Then, develop ways to prevent the recurrence of hypoglycemia.  Do not take a hot bath or shower right after an insulin shot.  Always carry treatment with you. Glucose tablets are the easiest to carry.  If you are going to drink alcohol, drink it only with meals.  Tell friends or family members ways to keep you safe during a seizure. This may include removing hard or sharp objects from the area or turning you on your side.  Maintain a healthy weight. SEEK MEDICAL CARE IF:   You are having problems keeping your blood glucose in your target range.  You are having frequent episodes of hypoglycemia.  You feel you might be having side effects from your medicines.  You are not sure why your blood glucose is dropping so low.  You notice a change in vision or a new problem with your vision. SEEK IMMEDIATE MEDICAL CARE IF:   Confusion develops.  A change in mental status occurs.  The  inability to swallow develops.  Fainting occurs.   This information is not intended to replace advice given to you by your health care provider. Make sure you discuss any questions you have with your health care provider.   Document Released: 05/30/2005 Document Revised: 06/04/2013 Document Reviewed: 02/03/2015 Elsevier Interactive Patient Education [employer].

## 2016-02-25 NOTE — Progress Notes (Signed)
Subjective:    Todd [name] is a 60 y.o. male who presents for an initial evaluation of type 2 DM.   C-Peptide from 01/11/2016 was WNL.     Current symptoms/problems include hypoglycemia  and visual disturbances.  At time of diagnosis which was 12/20/2015 patient was having polyuria, polydipsia and also weight loss but this has resolved.    Checking BG 1-2 times a day. Ranges from 62 - 132 since 01/12/16 A1c was over 14% when diagnosed and has decreased to 8.7% over the last 2 months.   Known diabetic complications: none Cardiovascular risk factors: advanced age (older than 67 for men, 66 for women), diabetes mellitus, family history of premature cardiovascular disease, hypertension, male gender and smoking/ tobacco exposure Current diabetic medications include lantus 35 units qam.   Eye exam current (within one year): no Weight trend: stable  Prior visit with CDE - yes Current diet: diabetic Current exercise: none   Is He on ACE inhibitor or angiotensin II receptor blocker?  Yes  lisinopril (Prinivil)    The following portions of the patient's history were reviewed and updated as appropriate: allergies, current medications, past family history, past medical history, past social history, past surgical history and problem list.    Objective:    BP 136/72   Pulse 82   Ht 5\' 8"  (1.727 m)   Wt 170 lb 12 oz (77.5 kg)   BMI 25.96 kg/m   A1c = 14.3% (07/10/217) RBG today in office was 149 A1c today was 8.7%  Lab Review Glucose (mg/dL)  Date Value  01/11/2016 130 (H)  12/28/2015 441 (>)   Glucose, Bld (mg/dL)  Date Value  12/21/2015 108 (H)  12/21/2015 126 (H)  12/20/2015 795 (HH)   CO2 (mmol/L)  Date Value  01/11/2016 24  12/28/2015 22  12/21/2015 23   BUN (mg/dL)  Date Value  01/11/2016 10  12/28/2015 11  12/21/2015 40 (H)  12/21/2015 43 (H)  12/20/2015 69 (H)   Creatinine, Ser (mg/dL)  Date Value  01/11/2016 1.02  12/28/2015 1.13  12/21/2015 1.34  (H)    Assessment:    Diabetes Mellitus type (likely type 2 but currently requiring insulin) under improving control.    Plan:    1.  Rx changes:   Add metformin 500mg  bid with food  Decrease Lantus to 25 units once daily 2.  Education: Reviewed 'ABCs' of diabetes management (respective goals in parentheses):  A1C (<7), blood pressure (<130/80), and cholesterol (LDL <100). 3.  CHO counting diet reviewed 4.  Recommended increase physical activity - goal is 150 minutes per week 5.  Discussed smoking cessation and available pharmacotherapy option to help - patient declined.  Also discussed triggers for smoking and how to change or avoid. 6.  Continue to check BG up to bid.   Discussed BG goals. 7. Follow up:  With PCP in November 2017

## 2016-04-20 ENCOUNTER — Ambulatory Visit (INDEPENDENT_AMBULATORY_CARE_PROVIDER_SITE_OTHER): Payer: BLUE CROSS/BLUE SHIELD | Admitting: Family Medicine

## 2016-04-20 ENCOUNTER — Encounter: Payer: Self-pay | Admitting: Family Medicine

## 2016-04-20 VITALS — BP 156/93 | HR 72 | Temp 97.6°F | Ht 68.0 in | Wt 174.0 lb

## 2016-04-20 DIAGNOSIS — I129 Hypertensive chronic kidney disease with stage 1 through stage 4 chronic kidney disease, or unspecified chronic kidney disease: Secondary | ICD-10-CM | POA: Diagnosis not present

## 2016-04-20 DIAGNOSIS — I10 Essential (primary) hypertension: Secondary | ICD-10-CM

## 2016-04-20 DIAGNOSIS — E1122 Type 2 diabetes mellitus with diabetic chronic kidney disease: Secondary | ICD-10-CM

## 2016-04-20 DIAGNOSIS — N183 Chronic kidney disease, stage 3 (moderate): Secondary | ICD-10-CM | POA: Diagnosis not present

## 2016-04-20 NOTE — Progress Notes (Signed)
Subjective:  Patient ID: Todd [name], male    DOB: January 30, 1956  Age: 60 y.o. MRN: [id number]  CC: Diabetes   HPI Todd [name] presents forFollow-up of diabetes. Patient checks blood sugar at home.   93 fasting and 115 postprandial Patient denies symptoms such as polyuria, polydipsia, excessive hunger, nausea No significant hypoglycemic spells noted. Medications as noted below. Taking them regularly without complication/adverse reaction being reported today. Checking feet daily.  History Todd [name] has a past medical history of Diabetes mellitus without complication ([place]); Goiter; Gout; Hyperlipidemia; Hypertension; and Thyroid disease.   Todd [name] has a past surgical history that includes Thyroid surgery.   His family history includes Diabetes in his maternal aunt and maternal uncle; Heart disease in his mother; Pneumonia in his father.Todd [name] reports that Todd [name] has been smoking Cigarettes.  Todd [name] has a 7.50 pack-year smoking history. Todd [name] has never used smokeless tobacco. Todd [name] reports that Todd [name] drinks alcohol. Todd [name] reports that Todd [name] does not use drugs.  Current Outpatient Prescriptions on File Prior to Visit  Medication Sig Dispense Refill  . Insulin Glargine (LANTUS SOLOSTAR) 100 UNIT/ML Solostar Pen Inject 25-35 Units into the skin daily. 5 pen PRN  . levothyroxine (SYNTHROID, LEVOTHROID) 112 MCG tablet Take 112 mcg by mouth daily before breakfast.    . lisinopril (PRINIVIL,ZESTRIL) 10 MG tablet Take 1 tablet (10 mg total) by mouth daily. 90 tablet 3  . metFORMIN (GLUCOPHAGE) 500 MG tablet Take 1 tablet (500 mg total) by mouth 2 (two) times daily with a meal. 60 tablet 2  . RELION INSULIN SYRINGE 31G X 15/64" 1 ML MISC   0   No current facility-administered medications on file prior to visit.     ROS Review of Systems  Constitutional: Negative for chills, diaphoresis, fever and unexpected weight change.  HENT: Negative for congestion, hearing loss, rhinorrhea and sore throat.   Eyes: Negative for  visual disturbance.  Respiratory: Negative for cough and shortness of breath.   Cardiovascular: Negative for chest pain.  Gastrointestinal: Negative for abdominal pain, constipation and diarrhea.  Genitourinary: Negative for dysuria and flank pain.  Musculoskeletal: Negative for arthralgias and joint swelling.  Skin: Negative for rash.  Neurological: Negative for dizziness and headaches.  Psychiatric/Behavioral: Negative for dysphoric mood and sleep disturbance.    Objective:  BP (!) 156/93   Pulse 72   Temp 97.6 F (36.4 C) (Oral)   Ht 5\' 8"  (1.727 m)   Wt 174 lb (78.9 kg)   BMI 26.46 kg/m   BP Readings from Last 3 Encounters:  05/04/16 136/78  04/20/16 (!) 156/93  02/24/16 136/72    Wt Readings from Last 3 Encounters:  05/04/16 174 lb (78.9 kg)  04/20/16 174 lb (78.9 kg)  02/24/16 170 lb 12 oz (77.5 kg)     Physical Exam  Constitutional: Todd [name] is oriented to person, place, and time. Todd [name] appears well-developed and well-nourished. No distress.  HENT:  Head: Normocephalic and atraumatic.  Right Ear: External ear normal.  Left Ear: External ear normal.  Nose: Nose normal.  Mouth/Throat: Oropharynx is clear and moist.  Eyes: Conjunctivae and EOM are normal. Pupils are equal, round, and reactive to light.  Neck: Normal range of motion. Neck supple. No thyromegaly present.  Cardiovascular: Normal rate, regular rhythm and normal heart sounds.   No murmur heard. Pulmonary/Chest: Effort normal and breath sounds normal. No respiratory distress. Todd [name] has no wheezes. Todd [name] has no rales.  Abdominal: Soft. Bowel sounds are normal. Todd [name] exhibits no distension. There is no tenderness.  Lymphadenopathy:    Todd [name] has no cervical adenopathy.  Neurological: Todd [name] is alert and oriented to person, place, and time. Todd [name] has normal reflexes.  Skin: Skin is warm and dry.  Psychiatric: Todd [name] has a normal mood and affect. His behavior is normal. Judgment and thought content normal.    Lab Results  Component  Value Date   HGBA1C 14.3 (H) 12/21/2015    Lab Results  Component Value Date   WBC 11.0 (H) 12/20/2015   HGB 18.0 (H) 12/20/2015   HCT 51.5 12/20/2015   PLT 705 (H) 12/20/2015   GLUCOSE 130 (H) 01/11/2016   ALT 25 12/28/2015   AST 21 12/28/2015   NA 138 01/11/2016   K 4.5 01/11/2016   CL 100 01/11/2016   CREATININE 1.02 01/11/2016   BUN 10 01/11/2016   CO2 24 01/11/2016   TSH 0.694 12/20/2015   HGBA1C 14.3 (H) 12/21/2015     Assessment & Plan:   Foch was seen today for diabetes.  Diagnoses and all orders for this visit:  Type 2 DM with CKD stage 3 and hypertension ([place])  Essential hypertension    A1c was checked 2 months ago. It isn't due yet. Most recently 8.7. This is not at goal. Diet and exercise etc. discussed with patient. Todd [name] stool for follow-up in 1 month including an A1c.  I am having Todd [name] maintain his levothyroxine, lisinopril, RELION INSULIN SYRINGE, metFORMIN, and Insulin Glargine.   Follow-up: Return in about 3 months (around 07/21/2016) for diabetes.  [name], M.D.

## 2016-05-04 ENCOUNTER — Encounter: Payer: Self-pay | Admitting: Family Medicine

## 2016-05-04 ENCOUNTER — Telehealth: Payer: Self-pay

## 2016-05-04 ENCOUNTER — Ambulatory Visit (INDEPENDENT_AMBULATORY_CARE_PROVIDER_SITE_OTHER): Payer: BLUE CROSS/BLUE SHIELD | Admitting: Family Medicine

## 2016-05-04 VITALS — BP 136/78 | HR 62 | Temp 97.2°F | Ht 68.0 in | Wt 174.0 lb

## 2016-05-04 DIAGNOSIS — N183 Chronic kidney disease, stage 3 unspecified: Secondary | ICD-10-CM

## 2016-05-04 DIAGNOSIS — I129 Hypertensive chronic kidney disease with stage 1 through stage 4 chronic kidney disease, or unspecified chronic kidney disease: Secondary | ICD-10-CM | POA: Diagnosis not present

## 2016-05-04 DIAGNOSIS — E1142 Type 2 diabetes mellitus with diabetic polyneuropathy: Secondary | ICD-10-CM | POA: Diagnosis not present

## 2016-05-04 DIAGNOSIS — E1122 Type 2 diabetes mellitus with diabetic chronic kidney disease: Secondary | ICD-10-CM | POA: Diagnosis not present

## 2016-05-04 MED ORDER — PREGABALIN 75 MG PO CAPS: ORAL_CAPSULE | ORAL | 0 refills | Status: DC

## 2016-05-04 NOTE — Progress Notes (Signed)
Subjective:  Patient ID: Todd [name], male    DOB: 06-Apr-1956  Age: 60 y.o. MRN: [id number]  CC: Peripheral Neuropathy (pt here today c/o burning sensation in both of his feet)   HPI Todd [name] presents for constant tingling, burning & on the bottom they are numb. Moves from one side to the other. Getting worse over several weeks. The sensation measures 6/10 waxing and waning. Onset is years ago. Had been mild though. Didn't understand it until a friend told him Todd [name] had similar sx and Lyrica helped.   History Todd [name] has a past medical history of Diabetes mellitus without complication ([place]); Goiter; Gout; Hyperlipidemia; Hypertension; and Thyroid disease.   Todd [name] has a past surgical history that includes Thyroid surgery.   His family history includes Diabetes in his maternal aunt and maternal uncle; Heart disease in his mother; Pneumonia in his father.Todd [name] reports that Todd [name] has been smoking Cigarettes.  Todd [name] has a 7.50 pack-year smoking history. Todd [name] has never used smokeless tobacco. Todd [name] reports that Todd [name] drinks alcohol. Todd [name] reports that Todd [name] does not use drugs.    ROS Review of Systems  Constitutional: Negative for chills, diaphoresis and fever.  HENT: Negative for rhinorrhea and sore throat.   Respiratory: Negative for cough and shortness of breath.   Cardiovascular: Negative for chest pain.  Gastrointestinal: Negative for abdominal pain.  Musculoskeletal: Positive for arthralgias. Negative for myalgias.  Skin: Negative for rash.  Neurological: Positive for numbness. Negative for weakness and headaches.    Objective:  BP 136/78   Pulse 62   Temp 97.2 F (36.2 C) (Oral)   Ht 5\' 8"  (1.727 m)   Wt 174 lb (78.9 kg)   BMI 26.46 kg/m   BP Readings from Last 3 Encounters:  05/04/16 136/78  04/20/16 (!) 156/93  02/24/16 136/72    Wt Readings from Last 3 Encounters:  05/04/16 174 lb (78.9 kg)  04/20/16 174 lb (78.9 kg)  02/24/16 170 lb 12 oz (77.5 kg)     Physical Exam    Constitutional: Todd [name] appears well-developed and well-nourished.  HENT:  Head: Normocephalic and atraumatic.  Right Ear: Tympanic membrane and external ear normal. No decreased hearing is noted.  Left Ear: Tympanic membrane and external ear normal. No decreased hearing is noted.  Mouth/Throat: No oropharyngeal exudate or posterior oropharyngeal erythema.  Eyes: Pupils are equal, round, and reactive to light.  Neck: Normal range of motion. Neck supple.  Cardiovascular: Normal rate and regular rhythm.   No murmur heard. Pulmonary/Chest: Breath sounds normal. No respiratory distress.  Abdominal: Soft. Bowel sounds are normal. Todd [name] exhibits no mass. There is no tenderness.  Vitals reviewed. Callus build up on both heels Diabetic Foot Form - Detailed   Diabetic Foot Exam - detailed Is there a history of foot ulcer?:  No Can the patient see the bottom of their feet?:  No Are the shoes appropriate in style and fit?:  No Is there swelling or and abnormal foot shape?:  Yes Are the toenails long?:  Yes Are the toenails thick?:  No Do you have pain in calf while walking?:  No Is there a claw toe deformity?:  Yes Is there elevated skin temparature?:  Yes Is there foot or ankle muscle weakness?:  No Are the toenails ingrown?:  No Normal Range of Motion:  No Pulse Foot Exam completed.:  Yes  Right posterior Tibialias:  Present Left posterior Tibialias:  Present  Right Dorsalis Pedis:  Present Left Dorsalis Pedis:  Present  Sensory Foot  Exam Completed.:  Yes Semmes-Weinstein Monofilament Test R Foot Test Control:  Pos L Foot Test Control:  Pos  R Site 1-Great Toe:  Neg L Site 1-Great Toe:  Neg  R Site 4:  Neg L Site 4:  Pos  R Site 5:  Pos L Site 5:  Neg         Lab Results  Component Value Date   WBC 11.0 (H) 12/20/2015   HGB 18.0 (H) 12/20/2015   HCT 51.5 12/20/2015   PLT 705 (H) 12/20/2015   GLUCOSE 130 (H) 01/11/2016   ALT 25 12/28/2015   AST 21 12/28/2015   NA 138 01/11/2016    K 4.5 01/11/2016   CL 100 01/11/2016   CREATININE 1.02 01/11/2016   BUN 10 01/11/2016   CO2 24 01/11/2016   TSH 0.694 12/20/2015   HGBA1C 14.3 (H) 12/21/2015    Dg Chest 2 View  Result Date: 12/20/2015 CLINICAL DATA:  Dysphagia and sore throat EXAM: CHEST  2 VIEW COMPARISON:  Oct 15, 2010 FINDINGS: There is scarring in the right lung base. Lungs elsewhere clear. Heart size and pulmonary vascularity are normal. No adenopathy. There is atherosclerotic calcification in the aortic arch region. No bone lesions are evident. There are surgical clips at the cervical -thoracic junction anteriorly. IMPRESSION: Scarring right base. No edema or consolidation. Aortic atherosclerosis. Electronically Signed   By: Lowella Grip III M.D.   On: 12/20/2015 09:50    Assessment & Plan:   Todd [name] was seen today for peripheral neuropathy.  Diagnoses and all orders for this visit:  Diabetic peripheral neuropathy ([place])  Type 2 DM with CKD stage 3 and hypertension ([place])  Other orders -     pregabalin (LYRICA) 75 MG capsule; 1 daily at bedtime 3 days then 2 daily 3 days, then 3 daily for 3 days then 4 daily.    I am having Todd [name] start on pregabalin. I am also having him maintain his levothyroxine, lisinopril, RELION INSULIN SYRINGE, metFORMIN, and Insulin Glargine.  Meds ordered this encounter  Medications  . pregabalin (LYRICA) 75 MG capsule    Sig: 1 daily at bedtime 3 days then 2 daily 3 days, then 3 daily for 3 days then 4 daily.    Dispense:  120 capsule    Refill:  0     Follow-up: Return in about 4 weeks (around 06/01/2016) for diabetes neuropathy.  [name], M.D.

## 2016-05-04 NOTE — Telephone Encounter (Signed)
Patient aware he needs to be seen, appointment made today at 2:25 with Dr. Livia Snellen

## 2016-05-04 NOTE — Telephone Encounter (Signed)
Patient had an appointment with you on 04/20/16 and forgot to mention he has been having pain in his feet for the last few months.  He explains it as a burning pain that is there almost all the time.  He spoke with a friend who also is a diabetic about the pain and his friend told him he was on Lyrica for the same thing.  The patient would like to know if you would prescribe Lyrica for him to try and see if it would help.  [facility]  Please advise.

## 2016-05-04 NOTE — Telephone Encounter (Signed)
He wouls have to be seen to evaluate for that. I'm happy to see him for that.

## 2016-05-18 ENCOUNTER — Encounter: Payer: Self-pay | Admitting: Family Medicine

## 2016-05-18 ENCOUNTER — Ambulatory Visit (INDEPENDENT_AMBULATORY_CARE_PROVIDER_SITE_OTHER): Payer: BLUE CROSS/BLUE SHIELD | Admitting: Family Medicine

## 2016-05-18 VITALS — BP 141/77 | HR 61 | Temp 97.5°F | Ht 68.0 in | Wt 180.2 lb

## 2016-05-18 DIAGNOSIS — E039 Hypothyroidism, unspecified: Secondary | ICD-10-CM | POA: Diagnosis not present

## 2016-05-18 DIAGNOSIS — N183 Chronic kidney disease, stage 3 (moderate): Secondary | ICD-10-CM | POA: Diagnosis not present

## 2016-05-18 DIAGNOSIS — E1122 Type 2 diabetes mellitus with diabetic chronic kidney disease: Secondary | ICD-10-CM

## 2016-05-18 DIAGNOSIS — I10 Essential (primary) hypertension: Secondary | ICD-10-CM

## 2016-05-18 MED ORDER — PREGABALIN 300 MG PO CAPS: ORAL_CAPSULE | ORAL | 5 refills | Status: DC

## 2016-05-18 NOTE — Progress Notes (Signed)
Subjective:  Patient ID: Todd [name], male    DOB: 1955/12/24  Age: 60 y.o. MRN: [id number]  CC: Diabetes (recheck with lab work)   HPI Telly Jawad presents forFollow-up of diabetes. Patient checks blood sugar at home.   98 fasting and 116 postprandial. Good relief with Lyrica.  Patient denies symptoms such as polyuria, polydipsia, excessive hunger, nausea No significant hypoglycemic spells noted. Medications as noted below. Taking them regularly without complication/adverse reaction being reported today. Checking feet daily. Last eye appt was remote   History Anthonny has a past medical history of Diabetes mellitus without complication ([place]); Goiter; Gout; Hyperlipidemia; Hypertension; and Thyroid disease.   He has a past surgical history that includes Thyroid surgery.   His family history includes Diabetes in his maternal aunt and maternal uncle; Heart disease in his mother; Pneumonia in his father.He reports that he has been smoking Cigarettes.  He has a 7.50 pack-year smoking history. He has never used smokeless tobacco. He reports that he drinks alcohol. He reports that he does not use drugs.  Current Outpatient Prescriptions on File Prior to Visit  Medication Sig Dispense Refill  . Insulin Glargine (LANTUS SOLOSTAR) 100 UNIT/ML Solostar Pen Inject 25-35 Units into the skin daily. 5 pen PRN  . levothyroxine (SYNTHROID, LEVOTHROID) 112 MCG tablet Take 112 mcg by mouth daily before breakfast.    . lisinopril (PRINIVIL,ZESTRIL) 10 MG tablet Take 1 tablet (10 mg total) by mouth daily. 90 tablet 3  . metFORMIN (GLUCOPHAGE) 500 MG tablet Take 1 tablet (500 mg total) by mouth 2 (two) times daily with a meal. 60 tablet 2  . RELION INSULIN SYRINGE 31G X 15/64" 1 ML MISC   0   No current facility-administered medications on file prior to visit.     ROS Review of Systems  Constitutional: Negative for chills, diaphoresis, fever and unexpected weight change.  HENT: Negative for  congestion, hearing loss, rhinorrhea and sore throat.   Eyes: Negative for visual disturbance.  Respiratory: Negative for cough and shortness of breath.   Cardiovascular: Negative for chest pain.  Gastrointestinal: Negative for abdominal pain, constipation and diarrhea.  Genitourinary: Negative for dysuria and flank pain.  Musculoskeletal: Negative for arthralgias and joint swelling.  Skin: Negative for rash.  Neurological: Negative for dizziness and headaches.  Psychiatric/Behavioral: Negative for dysphoric mood and sleep disturbance.    Objective:  BP (!) 141/77   Pulse 61   Temp 97.5 F (36.4 C) (Oral)   Ht 5' 8"  (1.727 m)   Wt 180 lb 3.2 oz (81.7 kg)   BMI 27.40 kg/m   BP Readings from Last 3 Encounters:  05/18/16 (!) 141/77  05/04/16 136/78  04/20/16 (!) 156/93    Wt Readings from Last 3 Encounters:  05/18/16 180 lb 3.2 oz (81.7 kg)  05/04/16 174 lb (78.9 kg)  04/20/16 174 lb (78.9 kg)     Physical Exam  Constitutional: He appears well-developed and well-nourished.  HENT:  Head: Normocephalic and atraumatic.  Right Ear: Tympanic membrane and external ear normal. No decreased hearing is noted.  Left Ear: Tympanic membrane and external ear normal. No decreased hearing is noted.  Mouth/Throat: No oropharyngeal exudate or posterior oropharyngeal erythema.  Eyes: Pupils are equal, round, and reactive to light.  Neck: Normal range of motion. Neck supple.  Cardiovascular: Normal rate and regular rhythm.   No murmur heard. Pulmonary/Chest: Breath sounds normal. No respiratory distress.  Abdominal: Soft. Bowel sounds are normal. He exhibits no mass. There is no tenderness.  Vitals reviewed.   Lab Results  Component Value Date   HGBA1C 14.3 (H) 12/21/2015    Lab Results  Component Value Date   WBC 11.0 (H) 12/20/2015   HGB 18.0 (H) 12/20/2015   HCT 51.5 12/20/2015   PLT 705 (H) 12/20/2015   GLUCOSE 130 (H) 01/11/2016   ALT 25 12/28/2015   AST 21 12/28/2015     NA 138 01/11/2016   K 4.5 01/11/2016   CL 100 01/11/2016   CREATININE 1.02 01/11/2016   BUN 10 01/11/2016   CO2 24 01/11/2016   TSH 0.694 12/20/2015   HGBA1C 14.3 (H) 12/21/2015     Assessment & Plan:   Keziah was seen today for diabetes.  Diagnoses and all orders for this visit:  Essential hypertension  Acquired hypothyroidism -     TSH + free T4; Future  Type 2 diabetes mellitus with stage 3 chronic kidney disease, without long-term current use of insulin (HCC) -     TSH + free T4; Future -     Microalbumin / creatinine urine ratio; Future -     Urinalysis; Future -     Bayer DCA Hb A1c Waived; Future -     Lipid panel; Future -     CMP14+EGFR; Future -     Ambulatory referral to Ophthalmology  Other orders -     pregabalin (LYRICA) 300 MG capsule; 1 daily at bedtime      I have changed Mr. Garcialopez pregabalin. I am also having him maintain his levothyroxine, lisinopril, RELION INSULIN SYRINGE, metFORMIN, and Insulin Glargine.  Meds ordered this encounter  Medications  . pregabalin (LYRICA) 300 MG capsule    Sig: 1 daily at bedtime    Dispense:  30 capsule    Refill:  5     Follow-up: Return in about 100 days (around 08/26/2016) for diabetes.  [name], M.D.

## 2016-05-24 ENCOUNTER — Other Ambulatory Visit: Payer: BLUE CROSS/BLUE SHIELD

## 2016-05-24 DIAGNOSIS — E039 Hypothyroidism, unspecified: Secondary | ICD-10-CM

## 2016-05-24 DIAGNOSIS — N183 Chronic kidney disease, stage 3 unspecified: Secondary | ICD-10-CM

## 2016-05-24 DIAGNOSIS — E1122 Type 2 diabetes mellitus with diabetic chronic kidney disease: Secondary | ICD-10-CM

## 2016-05-24 LAB — URINALYSIS
Bilirubin, UA: NEGATIVE
Glucose, UA: NEGATIVE
Leukocytes, UA: NEGATIVE
Nitrite, UA: NEGATIVE
Specific Gravity, UA: 1.025 (ref 1.005–1.030)
Urobilinogen, Ur: 2 mg/dL — ABNORMAL HIGH (ref 0.2–1.0)
pH, UA: 5.5 (ref 5.0–7.5)

## 2016-05-24 LAB — BAYER DCA HB A1C WAIVED: HB A1C (BAYER DCA - WAIVED): 6.2 % (ref <7.0)

## 2016-05-25 ENCOUNTER — Other Ambulatory Visit: Payer: Self-pay | Admitting: Family Medicine

## 2016-05-25 LAB — CMP14+EGFR
ALT: 34 IU/L (ref 0–44)
AST: 38 IU/L (ref 0–40)
Albumin/Globulin Ratio: 1 — ABNORMAL LOW (ref 1.2–2.2)
Albumin: 4 g/dL (ref 3.6–4.8)
Alkaline Phosphatase: 127 IU/L — ABNORMAL HIGH (ref 39–117)
BUN/Creatinine Ratio: 13 (ref 10–24)
BUN: 15 mg/dL (ref 8–27)
Bilirubin Total: 0.3 mg/dL (ref 0.0–1.2)
CALCIUM: 9.2 mg/dL (ref 8.6–10.2)
CHLORIDE: 104 mmol/L (ref 96–106)
CO2: 24 mmol/L (ref 18–29)
Creatinine, Ser: 1.12 mg/dL (ref 0.76–1.27)
GFR, EST AFRICAN AMERICAN: 82 mL/min/{1.73_m2} (ref >59)
GFR, EST NON AFRICAN AMERICAN: 71 mL/min/{1.73_m2} (ref >59)
GLUCOSE: 97 mg/dL (ref 65–99)
Globulin, Total: 4.1 g/dL (ref 1.5–4.5)
Potassium: 4.7 mmol/L (ref 3.5–5.2)
Sodium: 144 mmol/L (ref 134–144)
TOTAL PROTEIN: 8.1 g/dL (ref 6.0–8.5)

## 2016-05-25 LAB — TSH+FREE T4
FREE T4: 1.26 ng/dL (ref 0.82–1.77)
TSH: 6.46 u[IU]/mL — ABNORMAL HIGH (ref 0.450–4.500)

## 2016-05-25 LAB — MICROALBUMIN / CREATININE URINE RATIO
CREATININE, UR: 169.3 mg/dL
MICROALB/CREAT RATIO: 388.2 mg/g{creat} — AB (ref 0.0–30.0)
Microalbumin, Urine: 657.2 ug/mL

## 2016-05-25 LAB — LIPID PANEL
CHOL/HDL RATIO: 4.5 ratio (ref 0.0–5.0)
Cholesterol, Total: 183 mg/dL (ref 100–199)
HDL: 41 mg/dL (ref >39)
LDL Calculated: 103 mg/dL — ABNORMAL HIGH (ref 0–99)
Triglycerides: 195 mg/dL — ABNORMAL HIGH (ref 0–149)
VLDL Cholesterol Cal: 39 mg/dL (ref 5–40)

## 2016-05-25 MED ORDER — LISINOPRIL 40 MG PO TABS: 40.0000 mg | ORAL_TABLET | Freq: Every day | ORAL | 3 refills | Status: DC

## 2016-05-25 MED ORDER — LEVOTHYROXINE SODIUM 125 MCG PO TABS: 125.0000 ug | ORAL_TABLET | Freq: Every day | ORAL | 1 refills | Status: DC

## 2016-06-02 ENCOUNTER — Encounter: Payer: Self-pay | Admitting: *Deleted

## 2016-06-09 DIAGNOSIS — Z0289 Encounter for other administrative examinations: Secondary | ICD-10-CM

## 2016-06-15 ENCOUNTER — Telehealth: Payer: Self-pay | Admitting: Family Medicine

## 2016-06-15 LAB — HM DIABETES EYE EXAM

## 2016-06-15 NOTE — Telephone Encounter (Signed)
Not on hippa

## 2016-06-15 NOTE — Telephone Encounter (Signed)
Aware, needs to be on Hippa.

## 2016-06-21 ENCOUNTER — Encounter: Payer: Self-pay | Admitting: Family Medicine

## 2016-06-21 ENCOUNTER — Ambulatory Visit (INDEPENDENT_AMBULATORY_CARE_PROVIDER_SITE_OTHER): Payer: BLUE CROSS/BLUE SHIELD | Admitting: Family Medicine

## 2016-06-21 ENCOUNTER — Encounter (INDEPENDENT_AMBULATORY_CARE_PROVIDER_SITE_OTHER): Payer: Self-pay

## 2016-06-21 VITALS — BP 98/60 | HR 70 | Temp 97.5°F | Ht 68.0 in | Wt 181.0 lb

## 2016-06-21 DIAGNOSIS — E790 Hyperuricemia without signs of inflammatory arthritis and tophaceous disease: Secondary | ICD-10-CM | POA: Diagnosis not present

## 2016-06-21 DIAGNOSIS — E1122 Type 2 diabetes mellitus with diabetic chronic kidney disease: Secondary | ICD-10-CM | POA: Diagnosis not present

## 2016-06-21 DIAGNOSIS — L97521 Non-pressure chronic ulcer of other part of left foot limited to breakdown of skin: Secondary | ICD-10-CM

## 2016-06-21 DIAGNOSIS — E11621 Type 2 diabetes mellitus with foot ulcer: Secondary | ICD-10-CM

## 2016-06-21 DIAGNOSIS — N183 Chronic kidney disease, stage 3 (moderate): Secondary | ICD-10-CM | POA: Diagnosis not present

## 2016-06-21 DIAGNOSIS — L03119 Cellulitis of unspecified part of limb: Secondary | ICD-10-CM | POA: Diagnosis not present

## 2016-06-21 DIAGNOSIS — R4781 Slurred speech: Secondary | ICD-10-CM

## 2016-06-21 LAB — GLUCOSE HEMOCUE WAIVED: Glu Hemocue Waived: 76 mg/dL (ref 65–99)

## 2016-06-21 MED ORDER — COLCHICINE 0.6 MG PO TABS: ORAL_TABLET | ORAL | 2 refills | Status: DC

## 2016-06-21 MED ORDER — CIPROFLOXACIN HCL 500 MG PO TABS: 500.0000 mg | ORAL_TABLET | Freq: Two times a day (BID) | ORAL | 0 refills | Status: DC

## 2016-06-21 NOTE — Addendum Note (Signed)
Addended by: [name] on: 06/21/2016 03:05 PM   Modules accepted: Orders

## 2016-06-21 NOTE — Progress Notes (Signed)
Subjective:  Patient ID: Todd [name], male    DOB: Sep 08, 1955  Age: 61 y.o. MRN: [id number]  CC: Gout (pt here today c/o left foot being swollen and painful, he thinks it's gout)   HPI Todd [name] presents for Pain in left forefoot ongoing for 2 weeks. He says it hurts so much he can't sleep and he can't walk. It has been diagnosed as gout in the past when he had similar symptoms. Patient said he soaked it within some Epsom salts with no relief. He is not on any prophylaxis for gout. Last uric acid level done approximately 15 months ago was elevated at 9.5   History Todd [name] has a past medical history of Diabetes mellitus without complication ([place]); Goiter; Gout; Hyperlipidemia; Hypertension; and Thyroid disease.   He has a past surgical history that includes Thyroid surgery.   His family history includes Diabetes in his maternal aunt and maternal uncle; Heart disease in his mother; Pneumonia in his father.He reports that he has been smoking Cigarettes.  He has a 7.50 pack-year smoking history. He has never used smokeless tobacco. He reports that he drinks alcohol. He reports that he does not use drugs.    ROS Review of Systems  Constitutional: Negative for chills, fatigue and fever.  HENT: Negative for congestion and sore throat.   Respiratory: Negative for cough, shortness of breath and wheezing.   Cardiovascular: Negative for chest pain.  Musculoskeletal: Positive for joint swelling (see history of present illness regarding left foot). Negative for myalgias.  Skin: Negative for color change and rash.  Neurological: Negative for dizziness and syncope.    Objective:  BP 98/60   Pulse 70   Temp 97.5 F (36.4 C) (Oral)   Ht 5' 8"  (1.727 m)   Wt 181 lb (82.1 kg)   BMI 27.52 kg/m   BP Readings from Last 3 Encounters:  06/21/16 98/60  05/18/16 (!) 141/77  05/04/16 136/78    Wt Readings from Last 3 Encounters:  06/21/16 181 lb (82.1 kg)  05/18/16 180 lb 3.2 oz  (81.7 kg)  05/04/16 174 lb (78.9 kg)     Physical Exam  Constitutional: He is oriented to person, place, and time. He appears well-developed and well-nourished.  HENT:  Head: Normocephalic and atraumatic.  Right Ear: External ear normal.  Left Ear: External ear normal.  Mouth/Throat: No oropharyngeal exudate or posterior oropharyngeal erythema.  Eyes: Pupils are equal, round, and reactive to light.  Neck: Normal range of motion. Neck supple.  Cardiovascular: Normal rate and regular rhythm.   No murmur heard. Pulmonary/Chest: Breath sounds normal. No respiratory distress.  Neurological: He is alert and oriented to person, place, and time.  Skin: Skin is warm and dry. There is erythema (Erythema noted at the left dorsal forefoot. Mild tenderness at the MTP #1. There is some maceration between the second and third toes with desquamation.).  Psychiatric: He has a normal mood and affect. His behavior is normal. Thought content normal.  Vitals reviewed.     Assessment & Plan:   Todd [name] was seen today for gout.  Diagnoses and all orders for this visit:  Cellulitis of foot -     CMP14+EGFR -     Uric acid -     CBC with Differential/Platelet  Slurred speech -     Glucose Hemocue Waived -     CMP14+EGFR  Type 2 diabetes mellitus with stage 3 chronic kidney disease, without long-term current use of insulin (HCC) -  CMP14+EGFR  Hyperuricemia -     CMP14+EGFR -     Uric acid  Other orders -     ciprofloxacin (CIPRO) 500 MG tablet; Take 1 tablet (500 mg total) by mouth 2 (two) times daily. -     colchicine 0.6 MG tablet; Take 1 every 2 hours until pain improves or 6 pills have been taken. Then one twice daily until symptoms clear    I am having Mr. Allington start on ciprofloxacin and colchicine. I am also having him maintain his RELION INSULIN SYRINGE, metFORMIN, Insulin Glargine, pregabalin, levothyroxine, and lisinopril.  Meds ordered this encounter  Medications  .  ciprofloxacin (CIPRO) 500 MG tablet    Sig: Take 1 tablet (500 mg total) by mouth 2 (two) times daily.    Dispense:  14 tablet    Refill:  0  . colchicine 0.6 MG tablet    Sig: Take 1 every 2 hours until pain improves or 6 pills have been taken. Then one twice daily until symptoms clear    Dispense:  60 tablet    Refill:  2     Follow-up: Return in about 7 days (around 06/28/2016).  [name], M.D.

## 2016-06-22 LAB — URIC ACID: URIC ACID: 10 mg/dL — AB (ref 3.7–8.6)

## 2016-06-22 LAB — CMP14+EGFR
ALK PHOS: 82 IU/L (ref 39–117)
ALT: 47 IU/L — AB (ref 0–44)
AST: 54 IU/L — AB (ref 0–40)
Albumin/Globulin Ratio: 0.9 — ABNORMAL LOW (ref 1.2–2.2)
Albumin: 4.1 g/dL (ref 3.6–4.8)
BUN/Creatinine Ratio: 20 (ref 10–24)
BUN: 26 mg/dL (ref 8–27)
Bilirubin Total: 0.7 mg/dL (ref 0.0–1.2)
CO2: 22 mmol/L (ref 18–29)
Calcium: 8.8 mg/dL (ref 8.6–10.2)
Chloride: 102 mmol/L (ref 96–106)
Creatinine, Ser: 1.32 mg/dL — ABNORMAL HIGH (ref 0.76–1.27)
GFR calc Af Amer: 67 mL/min/{1.73_m2} (ref >59)
GFR calc non Af Amer: 58 mL/min/{1.73_m2} — ABNORMAL LOW (ref >59)
GLUCOSE: 61 mg/dL — AB (ref 65–99)
Globulin, Total: 4.7 g/dL — ABNORMAL HIGH (ref 1.5–4.5)
Potassium: 4.4 mmol/L (ref 3.5–5.2)
Sodium: 143 mmol/L (ref 134–144)
Total Protein: 8.8 g/dL — ABNORMAL HIGH (ref 6.0–8.5)

## 2016-06-22 LAB — CBC WITH DIFFERENTIAL/PLATELET
BASOS ABS: 0 10*3/uL (ref 0.0–0.2)
Basos: 0 %
EOS (ABSOLUTE): 0.2 10*3/uL (ref 0.0–0.4)
Eos: 2 %
Hematocrit: 45.1 % (ref 37.5–51.0)
Hemoglobin: 15.4 g/dL (ref 13.0–17.7)
IMMATURE GRANS (ABS): 0 10*3/uL (ref 0.0–0.1)
Immature Granulocytes: 0 %
LYMPHS ABS: 3.5 10*3/uL — AB (ref 0.7–3.1)
LYMPHS: 35 %
MCH: 29.6 pg (ref 26.6–33.0)
MCHC: 34.1 g/dL (ref 31.5–35.7)
MCV: 87 fL (ref 79–97)
MONOCYTES: 7 %
Monocytes Absolute: 0.7 10*3/uL (ref 0.1–0.9)
Neutrophils Absolute: 5.7 10*3/uL (ref 1.4–7.0)
Neutrophils: 56 %
Platelets: 591 10*3/uL — ABNORMAL HIGH (ref 150–379)
RBC: 5.21 x10E6/uL (ref 4.14–5.80)
RDW: 14.9 % (ref 12.3–15.4)
WBC: 10.1 10*3/uL (ref 3.4–10.8)

## 2016-06-29 ENCOUNTER — Ambulatory Visit: Payer: BLUE CROSS/BLUE SHIELD | Admitting: Family Medicine

## 2016-07-03 ENCOUNTER — Encounter (HOSPITAL_COMMUNITY): Payer: Self-pay | Admitting: *Deleted

## 2016-07-03 ENCOUNTER — Inpatient Hospital Stay (HOSPITAL_COMMUNITY)
Admission: EM | Admit: 2016-07-03 | Discharge: 2016-07-06 | DRG: 684 | Disposition: A | Discharge status: Home / Self Care | Payer: BLUE CROSS/BLUE SHIELD | Attending: Internal Medicine | Admitting: Internal Medicine

## 2016-07-03 ENCOUNTER — Emergency Department (HOSPITAL_COMMUNITY): Payer: BLUE CROSS/BLUE SHIELD

## 2016-07-03 DIAGNOSIS — M109 Gout, unspecified: Secondary | ICD-10-CM | POA: Diagnosis present

## 2016-07-03 DIAGNOSIS — Z8249 Family history of ischemic heart disease and other diseases of the circulatory system: Secondary | ICD-10-CM

## 2016-07-03 DIAGNOSIS — M6281 Muscle weakness (generalized): Secondary | ICD-10-CM

## 2016-07-03 DIAGNOSIS — N170 Acute kidney failure with tubular necrosis: Secondary | ICD-10-CM | POA: Diagnosis not present

## 2016-07-03 DIAGNOSIS — E039 Hypothyroidism, unspecified: Secondary | ICD-10-CM | POA: Diagnosis present

## 2016-07-03 DIAGNOSIS — E088 Diabetes mellitus due to underlying condition with unspecified complications: Secondary | ICD-10-CM | POA: Diagnosis present

## 2016-07-03 DIAGNOSIS — R0602 Shortness of breath: Secondary | ICD-10-CM | POA: Diagnosis not present

## 2016-07-03 DIAGNOSIS — Z794 Long term (current) use of insulin: Secondary | ICD-10-CM

## 2016-07-03 DIAGNOSIS — Z87891 Personal history of nicotine dependence: Secondary | ICD-10-CM

## 2016-07-03 DIAGNOSIS — E860 Dehydration: Secondary | ICD-10-CM | POA: Diagnosis present

## 2016-07-03 DIAGNOSIS — I4581 Long QT syndrome: Secondary | ICD-10-CM | POA: Diagnosis present

## 2016-07-03 DIAGNOSIS — E1122 Type 2 diabetes mellitus with diabetic chronic kidney disease: Secondary | ICD-10-CM | POA: Diagnosis present

## 2016-07-03 DIAGNOSIS — R778 Other specified abnormalities of plasma proteins: Secondary | ICD-10-CM | POA: Diagnosis present

## 2016-07-03 DIAGNOSIS — L0390 Cellulitis, unspecified: Secondary | ICD-10-CM | POA: Diagnosis present

## 2016-07-03 DIAGNOSIS — N184 Chronic kidney disease, stage 4 (severe): Secondary | ICD-10-CM | POA: Diagnosis present

## 2016-07-03 DIAGNOSIS — I129 Hypertensive chronic kidney disease with stage 1 through stage 4 chronic kidney disease, or unspecified chronic kidney disease: Secondary | ICD-10-CM | POA: Diagnosis present

## 2016-07-03 DIAGNOSIS — R7989 Other specified abnormal findings of blood chemistry: Secondary | ICD-10-CM | POA: Diagnosis present

## 2016-07-03 DIAGNOSIS — I951 Orthostatic hypotension: Secondary | ICD-10-CM | POA: Diagnosis present

## 2016-07-03 DIAGNOSIS — N19 Unspecified kidney failure: Secondary | ICD-10-CM

## 2016-07-03 DIAGNOSIS — F17210 Nicotine dependence, cigarettes, uncomplicated: Secondary | ICD-10-CM | POA: Diagnosis present

## 2016-07-03 DIAGNOSIS — N179 Acute kidney failure, unspecified: Secondary | ICD-10-CM

## 2016-07-03 DIAGNOSIS — R2689 Other abnormalities of gait and mobility: Secondary | ICD-10-CM

## 2016-07-03 DIAGNOSIS — E785 Hyperlipidemia, unspecified: Secondary | ICD-10-CM | POA: Diagnosis present

## 2016-07-03 DIAGNOSIS — I10 Essential (primary) hypertension: Secondary | ICD-10-CM | POA: Diagnosis present

## 2016-07-03 DIAGNOSIS — Z833 Family history of diabetes mellitus: Secondary | ICD-10-CM

## 2016-07-03 LAB — BASIC METABOLIC PANEL
Anion gap: 15 (ref 5–15)
BUN: 120 mg/dL — ABNORMAL HIGH (ref 6–20)
CHLORIDE: 103 mmol/L (ref 101–111)
CO2: 14 mmol/L — AB (ref 22–32)
CREATININE: 6.6 mg/dL — AB (ref 0.61–1.24)
Calcium: 9.3 mg/dL (ref 8.9–10.3)
GFR calc non Af Amer: 8 mL/min — ABNORMAL LOW (ref >60)
GFR, EST AFRICAN AMERICAN: 9 mL/min — AB (ref >60)
GLUCOSE: 98 mg/dL (ref 65–99)
Potassium: 4.8 mmol/L (ref 3.5–5.1)
Sodium: 132 mmol/L — ABNORMAL LOW (ref 135–145)

## 2016-07-03 LAB — CBC WITH DIFFERENTIAL/PLATELET
BASOS PCT: 0 %
Basophils Absolute: 0 10*3/uL (ref 0.0–0.1)
Eosinophils Absolute: 0.1 10*3/uL (ref 0.0–0.7)
Eosinophils Relative: 1 %
HEMATOCRIT: 46.6 % (ref 39.0–52.0)
HEMOGLOBIN: 16.3 g/dL (ref 13.0–17.0)
LYMPHS PCT: 18 %
Lymphs Abs: 2.3 10*3/uL (ref 0.7–4.0)
MCH: 29.4 pg (ref 26.0–34.0)
MCHC: 35 g/dL (ref 30.0–36.0)
MCV: 84 fL (ref 78.0–100.0)
MONO ABS: 0.8 10*3/uL (ref 0.1–1.0)
Monocytes Relative: 7 %
NEUTROS ABS: 9.4 10*3/uL — AB (ref 1.7–7.7)
NEUTROS PCT: 74 %
Platelets: 859 10*3/uL — ABNORMAL HIGH (ref 150–400)
RBC: 5.55 MIL/uL (ref 4.22–5.81)
RDW: 13.9 % (ref 11.5–15.5)
WBC: 12.7 10*3/uL — ABNORMAL HIGH (ref 4.0–10.5)

## 2016-07-03 LAB — TROPONIN I: Troponin I: 0.05 ng/mL (ref <0.03)

## 2016-07-03 LAB — BRAIN NATRIURETIC PEPTIDE: B Natriuretic Peptide: 37 pg/mL (ref 0.0–100.0)

## 2016-07-03 NOTE — ED Notes (Signed)
Pt taken to bathroom via WC and back to room 

## 2016-07-03 NOTE — ED Notes (Signed)
CRITICAL VALUE ALERT  Critical value received:  Troponin- 0.05  Date of notification:  07/03/16  Time of notification:  2346  Critical value read back:Yes.    Nurse who received alert:  Idelia Salm, RN  MD notified (1st page):  2346  Time of first page:  2356  MD notified (2nd page):  Time of second page:  Responding MD:  Dr Lindajo Royal  Time MD responded:  [phone]

## 2016-07-03 NOTE — ED Triage Notes (Signed)
Pt c/o sob that started 3-4 days ago, bilateral foot pain that started a week ago, denies any chest pain,

## 2016-07-03 NOTE — ED Provider Notes (Addendum)
[facility] DEPT Provider Note   CSN: [id number] Arrival date & time: 07/03/16  1921 By signing my name below, I, Dyke Brackett, attest that this documentation has been prepared under the direction and in the presence of Nat Christen, MD . Electronically Signed: Dyke Brackett, Scribe. 07/03/2016. 10:31 PM.  History   Chief Complaint Chief Complaint  Patient presents with  . Shortness of Breath   HPI Todd [name] is a 61 y.o. male with a history of type 2 DM with CKD stage 3, gout, and HTN who presents to the Emergency Department complaining of progressively worsening bilateral foot pain onset last week. Pain is exacerbated by bearing weight. Family member states he has been unable to walk in several days due to the pain. Per pt, he was diagnosed with DM on 12/20/15. He his blood sugars have been running around 100. He reports intermittent SOB while at rest onset this week. Per pt, he has two episodes per day the episodes last about 5 minutes before resolving. No alleviating or modifying factors noted. Pt denies CP, fever, or cough. Pt has no other complaints or symptoms at this time. Creatinine on 06/21/16 was 1.32  PCP: [name], MD   The history is provided by the patient. No language interpreter was used.   Past Medical History:  Diagnosis Date  . Diabetes mellitus without complication ([place])   . Goiter   . Gout   . Hyperlipidemia   . Hypertension   . Thyroid disease    Patient Active Problem List   Diagnosis Date Noted  . Hyperuricemia 06/21/2016  . HTN (hypertension) 01/05/2016  . Type 2 diabetes mellitus with stage 3 chronic kidney disease, without long-term current use of insulin ([place]) 01/05/2016  . Hypothyroidism 05/10/2011   Past Surgical History:  Procedure Laterality Date  . THYROID SURGERY      Home Medications    Prior to Admission medications   Medication Sig Start Date End Date Taking? Authorizing Provider  colchicine 0.6 MG tablet Take 1 every 2 hours  until pain improves or 6 pills have been taken. Then one twice daily until symptoms clear Patient taking differently: Take 0.6 mg by mouth See admin instructions. Take 1 every 2 hours until pain improves or 6 pills have been taken. Then one twice daily until symptoms clear 06/21/16  Yes [name], MD  Insulin Glargine (LANTUS SOLOSTAR) 100 UNIT/ML Solostar Pen Inject 25-35 Units into the skin daily. Patient taking differently: Inject 25 Units into the skin daily.  02/24/16  Yes Cherre Robins, PharmD  levothyroxine (SYNTHROID, LEVOTHROID) 125 MCG tablet Take 1 tablet (125 mcg total) by mouth daily before breakfast. 05/25/16  Yes [name], MD  lisinopril (PRINIVIL,ZESTRIL) 10 MG tablet Take 10 mg by mouth daily. 06/25/16  Yes Historical Provider, MD  lisinopril (PRINIVIL,ZESTRIL) 40 MG tablet Take 1 tablet (40 mg total) by mouth daily. 05/25/16  Yes [name], MD  ciprofloxacin (CIPRO) 500 MG tablet Take 1 tablet (500 mg total) by mouth 2 (two) times daily. Patient not taking: Reported on 07/03/2016 06/21/16   [name], MD  metFORMIN (GLUCOPHAGE) 500 MG tablet Take 1 tablet (500 mg total) by mouth 2 (two) times daily with a meal. Patient not taking: Reported on 07/03/2016 02/24/16   Cherre Robins, PharmD  pregabalin (LYRICA) 300 MG capsule 1 daily at bedtime Patient not taking: Reported on 07/03/2016 05/18/16   [name], MD  [place] X 15/64" 1 ML [place]  12/22/15   Historical Provider, MD  Family History Family History  Problem Relation Age of Onset  . Heart disease Mother   . Pneumonia Father   . Diabetes Maternal Aunt   . Diabetes Maternal Uncle     Social History Social History  Substance Use Topics  . Smoking status: Current Every Day Smoker    Packs/day: 0.50    Years: 15.00    Types: Cigarettes    Last attempt to quit: 11/12/2014  . Smokeless tobacco: Never Used     Comment: patient is currently decreaseing number of cig per day  . Alcohol use 0.0  oz/week     Comment: occasionally    Allergies   Patient has no known allergies.  Review of Systems Review of Systems 10 systems reviewed and all are negative for acute change except as noted in the HPI.   Physical Exam Updated Vital Signs BP 118/72   Pulse 87   Temp 97.7 F (36.5 C) (Oral)   Resp 15   Ht 5\' 8"  (1.727 m)   Wt 170 lb (77.1 kg)   SpO2 100%   BMI 25.85 kg/m   Physical Exam  Constitutional: He is oriented to person, place, and time. He appears well-developed and well-nourished.  HENT:  Head: Normocephalic and atraumatic.  Eyes: Conjunctivae are normal.  Neck: Neck supple.  Cardiovascular: Normal rate and regular rhythm.   Pulmonary/Chest: Effort normal and breath sounds normal.  Abdominal: Soft. Bowel sounds are normal.  Musculoskeletal: Normal range of motion.  When pt stood, he appeared to have pain in his feet.  Neurological: He is alert and oriented to person, place, and time.  Skin: Skin is warm and dry.  Scaly plaque on the plantar aspect of bilateral feet  Psychiatric: He has a normal mood and affect. His behavior is normal.  Nursing note and vitals reviewed.  ED Treatments / Results  DIAGNOSTIC STUDIES:  Oxygen Saturation is 98% on RA, normal by my interpretation.    COORDINATION OF CARE:  10:31 PM Discussed treatment plan which includes DG feet, CBC, troponin 1, brain natriuretic peptide, and BMP with pt at bedside and pt agreed to plan.  Labs (all labs ordered are listed, but only abnormal results are displayed) Labs Reviewed  CBC WITH DIFFERENTIAL/PLATELET - Abnormal; Notable for the following:       Result Value   WBC 12.7 (*)    Platelets 859 (*)    Neutro Abs 9.4 (*)    All other components within normal limits  BASIC METABOLIC PANEL - Abnormal; Notable for the following:    Sodium 132 (*)    CO2 14 (*)    BUN 120 (*)    Creatinine, Ser 6.60 (*)    GFR calc non Af Amer 8 (*)    GFR calc Af Amer 9 (*)    All other components  within normal limits  TROPONIN I - Abnormal; Notable for the following:    Troponin I 0.05 (*)    All other components within normal limits  BRAIN NATRIURETIC PEPTIDE    EKG  EKG Interpretation  Date/Time:  Sunday July 03 2016 19:32:47 EST Ventricular Rate:  91 PR Interval:  142 QRS Duration: 98 QT Interval:  396 QTC Calculation: 487 R Axis:   69 Text Interpretation:  Sinus rhythm with sinus arrhythmia with occasional Premature ventricular complexes ST & T wave abnormality, consider inferolateral ischemia Prolonged QT Abnormal ECG Confirmed by Lacinda Axon  MD, Tashala Cumbo ([id number]) on 07/03/2016 11:55:28 PM  Radiology Dg Chest 2 View  Result Date: 07/03/2016 CLINICAL DATA:  Shortness of breath for 3-4 days. EXAM: CHEST  2 VIEW COMPARISON:  12/20/2015 FINDINGS: Surgical clips are again seen in the lower neck related the thyroidectomy. The cardiomediastinal silhouette is within normal limits. The lungs are well inflated and clear. There is no evidence of pleural effusion or pneumothorax. No acute osseous abnormality is identified. IMPRESSION: No active cardiopulmonary disease. Electronically Signed   By: Logan Bores M.D.   On: 07/03/2016 20:36   Dg Foot Complete Left  Result Date: 07/03/2016 CLINICAL DATA:  Foot pain.  Pain for 3-4 days EXAM: LEFT FOOT - COMPLETE 3+ VIEW COMPARISON:  None. FINDINGS: No fracture or dislocation of mid foot or forefoot. The phalanges are normal. The calcaneus is normal. No soft tissue abnormality. The spleen is deformity. Benign-appearing chondroid lesion in the RIGHT tibia. IMPRESSION: No acute osseous abnormality. Electronically Signed   By: Suzy Bouchard M.D.   On: 07/03/2016 23:56   Dg Foot Complete Right  Result Date: 07/03/2016 CLINICAL DATA:  Bilateral foot pain.  No injury. EXAM: RIGHT FOOT COMPLETE - 3+ VIEW COMPARISON:  None. FINDINGS: No fracture or dislocation of mid foot or forefoot. The phalanges are normal. The calcaneus is normal. No soft  tissue abnormality. Pes planus deformity IMPRESSION: No acute osseous abnormality. Electronically Signed   By: Suzy Bouchard M.D.   On: 07/03/2016 23:55    Procedures Procedures (including critical care time)  Medications Ordered in ED Medications - No data to display   Initial Impression / Assessment and Plan / ED Course  I have reviewed the triage vital signs and the nursing notes.  Pertinent labs & imaging results that were available during my care of the patient were reviewed by me and considered in my medical decision making (see chart for details).    Patient presents with bilateral foot pain. No evidence of cellulitis. X-rays of both feet were negative. However, BUN/creatinine noted to be grossly elevated at 120/6.6.   EKG and potassium are normal. Will admit to general medicine and consult nephrology in the morning.   0045:  Dr Deterding returned call.  Patient will be admitted to [facility]. Final Clinical Impressions(s) / ED Diagnoses   Final diagnoses:  Renal failure, unspecified chronicity    New Prescriptions New Prescriptions   No medications on file  I personally performed the services described in this documentation, which was scribed in my presence. The recorded information has been reviewed and is accurate.     Nat Christen, MD 07/04/16 [place], MD 07/04/16 [phone]

## 2016-07-03 NOTE — ED Notes (Signed)
EKG done and given to Dr Cook 

## 2016-07-04 ENCOUNTER — Observation Stay (HOSPITAL_COMMUNITY): Payer: BLUE CROSS/BLUE SHIELD

## 2016-07-04 DIAGNOSIS — E088 Diabetes mellitus due to underlying condition with unspecified complications: Secondary | ICD-10-CM | POA: Diagnosis not present

## 2016-07-04 DIAGNOSIS — N170 Acute kidney failure with tubular necrosis: Secondary | ICD-10-CM | POA: Diagnosis present

## 2016-07-04 DIAGNOSIS — R7989 Other specified abnormal findings of blood chemistry: Secondary | ICD-10-CM

## 2016-07-04 DIAGNOSIS — I951 Orthostatic hypotension: Secondary | ICD-10-CM | POA: Diagnosis present

## 2016-07-04 DIAGNOSIS — E1122 Type 2 diabetes mellitus with diabetic chronic kidney disease: Secondary | ICD-10-CM | POA: Diagnosis present

## 2016-07-04 DIAGNOSIS — E860 Dehydration: Secondary | ICD-10-CM | POA: Diagnosis present

## 2016-07-04 DIAGNOSIS — E785 Hyperlipidemia, unspecified: Secondary | ICD-10-CM | POA: Diagnosis present

## 2016-07-04 DIAGNOSIS — M109 Gout, unspecified: Secondary | ICD-10-CM | POA: Diagnosis present

## 2016-07-04 DIAGNOSIS — Z794 Long term (current) use of insulin: Secondary | ICD-10-CM | POA: Diagnosis not present

## 2016-07-04 DIAGNOSIS — E039 Hypothyroidism, unspecified: Secondary | ICD-10-CM | POA: Diagnosis present

## 2016-07-04 DIAGNOSIS — R0602 Shortness of breath: Secondary | ICD-10-CM | POA: Diagnosis present

## 2016-07-04 DIAGNOSIS — I10 Essential (primary) hypertension: Secondary | ICD-10-CM

## 2016-07-04 DIAGNOSIS — N179 Acute kidney failure, unspecified: Secondary | ICD-10-CM | POA: Diagnosis present

## 2016-07-04 DIAGNOSIS — I4581 Long QT syndrome: Secondary | ICD-10-CM | POA: Diagnosis present

## 2016-07-04 DIAGNOSIS — R748 Abnormal levels of other serum enzymes: Secondary | ICD-10-CM | POA: Diagnosis not present

## 2016-07-04 DIAGNOSIS — F17210 Nicotine dependence, cigarettes, uncomplicated: Secondary | ICD-10-CM | POA: Diagnosis present

## 2016-07-04 DIAGNOSIS — L0390 Cellulitis, unspecified: Secondary | ICD-10-CM | POA: Diagnosis present

## 2016-07-04 DIAGNOSIS — Z8249 Family history of ischemic heart disease and other diseases of the circulatory system: Secondary | ICD-10-CM | POA: Diagnosis not present

## 2016-07-04 DIAGNOSIS — I129 Hypertensive chronic kidney disease with stage 1 through stage 4 chronic kidney disease, or unspecified chronic kidney disease: Secondary | ICD-10-CM | POA: Diagnosis present

## 2016-07-04 DIAGNOSIS — N184 Chronic kidney disease, stage 4 (severe): Secondary | ICD-10-CM | POA: Diagnosis present

## 2016-07-04 DIAGNOSIS — Z87891 Personal history of nicotine dependence: Secondary | ICD-10-CM | POA: Diagnosis not present

## 2016-07-04 DIAGNOSIS — Z833 Family history of diabetes mellitus: Secondary | ICD-10-CM | POA: Diagnosis not present

## 2016-07-04 DIAGNOSIS — R778 Other specified abnormalities of plasma proteins: Secondary | ICD-10-CM | POA: Diagnosis present

## 2016-07-04 LAB — GLUCOSE, CAPILLARY
GLUCOSE-CAPILLARY: 104 mg/dL — AB (ref 65–99)
GLUCOSE-CAPILLARY: 90 mg/dL (ref 65–99)
GLUCOSE-CAPILLARY: 108 mg/dL — AB (ref 65–99)
Glucose-Capillary: 84 mg/dL (ref 65–99)
Glucose-Capillary: 118 mg/dL — ABNORMAL HIGH (ref 65–99)

## 2016-07-04 LAB — BASIC METABOLIC PANEL
ANION GAP: 14 (ref 5–15)
BUN: 122 mg/dL — ABNORMAL HIGH (ref 6–20)
CO2: 12 mmol/L — ABNORMAL LOW (ref 22–32)
Calcium: 8.4 mg/dL — ABNORMAL LOW (ref 8.9–10.3)
Chloride: 105 mmol/L (ref 101–111)
Creatinine, Ser: 6.33 mg/dL — ABNORMAL HIGH (ref 0.61–1.24)
GFR, EST AFRICAN AMERICAN: 10 mL/min — AB (ref >60)
GFR, EST NON AFRICAN AMERICAN: 9 mL/min — AB (ref >60)
Glucose, Bld: 98 mg/dL (ref 65–99)
POTASSIUM: 4.8 mmol/L (ref 3.5–5.1)
SODIUM: 131 mmol/L — AB (ref 135–145)

## 2016-07-04 LAB — URINALYSIS, ROUTINE W REFLEX MICROSCOPIC
Bacteria, UA: NONE SEEN
Bilirubin Urine: NEGATIVE
Glucose, UA: NEGATIVE mg/dL
Ketones, ur: NEGATIVE mg/dL
Leukocytes, UA: NEGATIVE
Nitrite: NEGATIVE
Protein, ur: 30 mg/dL — AB
SPECIFIC GRAVITY, URINE: 1.015 (ref 1.005–1.030)
pH: 5 (ref 5.0–8.0)

## 2016-07-04 LAB — CBC
HEMATOCRIT: 44.7 % (ref 39.0–52.0)
HEMOGLOBIN: 15.4 g/dL (ref 13.0–17.0)
MCH: 28.7 pg (ref 26.0–34.0)
MCHC: 34.5 g/dL (ref 30.0–36.0)
MCV: 83.4 fL (ref 78.0–100.0)
Platelets: 794 10*3/uL — ABNORMAL HIGH (ref 150–400)
RBC: 5.36 MIL/uL (ref 4.22–5.81)
RDW: 13.9 % (ref 11.5–15.5)
WBC: 12.1 10*3/uL — AB (ref 4.0–10.5)

## 2016-07-04 LAB — PROTEIN / CREATININE RATIO, URINE
CREATININE, URINE: 120.26 mg/dL
PROTEIN CREATININE RATIO: 0.31 mg/mg{creat} — AB (ref 0.00–0.15)
TOTAL PROTEIN, URINE: 37 mg/dL

## 2016-07-04 LAB — C DIFFICILE QUICK SCREEN W PCR REFLEX
C DIFFICLE (CDIFF) ANTIGEN: NEGATIVE
C Diff interpretation: NOT DETECTED
C Diff toxin: NEGATIVE

## 2016-07-04 LAB — TROPONIN I
TROPONIN I: 0.05 ng/mL — AB (ref <0.03)
Troponin I: 0.05 ng/mL (ref <0.03)
Troponin I: 0.05 ng/mL (ref <0.03)

## 2016-07-04 LAB — T4, FREE: FREE T4: 1.62 ng/dL — AB (ref 0.61–1.12)

## 2016-07-04 LAB — VITAMIN B12: VITAMIN B 12: 280 pg/mL (ref 180–914)

## 2016-07-04 LAB — LACTIC ACID, PLASMA
LACTIC ACID, VENOUS: 0.9 mmol/L (ref 0.5–1.9)
LACTIC ACID, VENOUS: 1 mmol/L (ref 0.5–1.9)

## 2016-07-04 LAB — TSH: TSH: 0.099 u[IU]/mL — ABNORMAL LOW (ref 0.350–4.500)

## 2016-07-04 LAB — URIC ACID: Uric Acid, Serum: 11.4 mg/dL — ABNORMAL HIGH (ref 4.4–7.6)

## 2016-07-04 MED ORDER — ACETAMINOPHEN 325 MG PO TABS
650.0000 mg | ORAL_TABLET | Freq: Four times a day (QID) | ORAL | Status: DC | PRN
  Administered 2016-07-04: 650 mg via ORAL

## 2016-07-04 MED ORDER — SODIUM CHLORIDE 0.9 % IV BOLUS (SEPSIS)
1000.0000 mL | Freq: Once | INTRAVENOUS | Status: AC
  Administered 2016-07-04: 1000 mL via INTRAVENOUS

## 2016-07-04 MED ORDER — SODIUM CHLORIDE 0.9 % IV SOLN
INTRAVENOUS | Status: DC
  Administered 2016-07-04 – 2016-07-06 (×6): via INTRAVENOUS

## 2016-07-04 MED ORDER — ONDANSETRON HCL 4 MG/2ML IJ SOLN: 4.0000 mg | Freq: Four times a day (QID) | INTRAMUSCULAR | Status: DC | PRN

## 2016-07-04 MED ORDER — HEPARIN SODIUM (PORCINE) 5000 UNIT/ML IJ SOLN
5000.0000 [IU] | Freq: Three times a day (TID) | INTRAMUSCULAR | Status: DC
  Administered 2016-07-04 – 2016-07-06 (×5): 5000 [IU] via SUBCUTANEOUS
  Filled 2016-07-04 (×5): qty 1

## 2016-07-04 MED ORDER — SODIUM CHLORIDE 0.9 % IV BOLUS (SEPSIS): 1000.0000 mL | Freq: Once | INTRAVENOUS | Status: AC

## 2016-07-04 MED ORDER — ENSURE ENLIVE PO LIQD: 237.0000 mL | Freq: Two times a day (BID) | ORAL | Status: DC

## 2016-07-04 MED ORDER — LEVOTHYROXINE SODIUM 25 MCG PO TABS
125.0000 ug | ORAL_TABLET | Freq: Every day | ORAL | Status: DC
  Administered 2016-07-04 – 2016-07-05 (×2): 125 ug via ORAL
  Filled 2016-07-04 (×2): qty 1

## 2016-07-04 MED ORDER — ONDANSETRON HCL 4 MG PO TABS: 4.0000 mg | ORAL_TABLET | Freq: Four times a day (QID) | ORAL | Status: DC | PRN

## 2016-07-04 MED ORDER — GLUCERNA SHAKE PO LIQD
237.0000 mL | Freq: Two times a day (BID) | ORAL | Status: DC
  Administered 2016-07-04: 237 mL via ORAL

## 2016-07-04 MED ORDER — HYDROCODONE-ACETAMINOPHEN 7.5-325 MG PO TABS
1.0000 | ORAL_TABLET | Freq: Four times a day (QID) | ORAL | Status: DC | PRN
  Administered 2016-07-04 – 2016-07-06 (×6): 1 via ORAL
  Filled 2016-07-04 (×6): qty 1

## 2016-07-04 MED ORDER — SODIUM CHLORIDE 0.9 % IV SOLN
INTRAVENOUS | Status: DC
  Administered 2016-07-04: 03:00:00 via INTRAVENOUS

## 2016-07-04 MED ORDER — SODIUM CHLORIDE 0.9 % IV BOLUS (SEPSIS)
500.0000 mL | Freq: Once | INTRAVENOUS | Status: AC
  Administered 2016-07-04: 500 mL via INTRAVENOUS

## 2016-07-04 MED ORDER — ADULT MULTIVITAMIN W/MINERALS CH
1.0000 | ORAL_TABLET | Freq: Every day | ORAL | Status: DC
  Administered 2016-07-04 – 2016-07-06 (×3): 1 via ORAL
  Filled 2016-07-04 (×3): qty 1

## 2016-07-04 NOTE — H&P (Signed)
History and Physical    Todd [name] YQI:[id number] DOB: 1955-07-14 DOA: 07/03/2016  PCP: [name], MD  Patient coming from: home  Chief Complaint:  Bilateral foot pain  HPI: Todd [name] is a 61 y.o. male with medical history significant of IDDM, CKD, HTN, recent dx of gout and cellulitis on 1/9 started on cipro and colchicine which he has been taking.  Pt states both of his toes were swollen when he saw his doctor and there was infection to the left foot.  The rash/infection has gone, and the swelling to his toes has resolved he is just still having pain to bottom of this feet for the past week which is the reason he comes in to the ED.  He says the last couple of days he has not been feeling right, not eating or drinking a lot, just feeling bad.  No fevers.  No swelling anywhere.  He has been sob, no cough.  No pain anywhere except in his feet (which has only been present a week).  He has been urinating normally, denies any dysuria or change in urinary symptoms.  No abdominal pain.  No n/v/d.  Pt found to have a cr over 6 which is markedly changed since last week and referred for admission for his acute renal failure.     Review of Systems: As per HPI otherwise 10 point review of systems negative.   Past Medical History:  Diagnosis Date  . Diabetes mellitus without complication ([place])   . Goiter   . Gout   . Hyperlipidemia   . Hypertension   . Thyroid disease     Past Surgical History:  Procedure Laterality Date  . THYROID SURGERY       reports that he has been smoking Cigarettes.  He has a 7.50 pack-year smoking history. He has never used smokeless tobacco. He reports that he drinks alcohol. He reports that he does not use drugs.  No Known Allergies  Family History  Problem Relation Age of Onset  . Heart disease Mother   . Pneumonia Father   . Diabetes Maternal Aunt   . Diabetes Maternal Uncle     Prior to Admission medications   Medication Sig Start Date End  Date Taking? Authorizing Provider  colchicine 0.6 MG tablet Take 1 every 2 hours until pain improves or 6 pills have been taken. Then one twice daily until symptoms clear Patient taking differently: Take 0.6 mg by mouth See admin instructions. Take 1 every 2 hours until pain improves or 6 pills have been taken. Then one twice daily until symptoms clear 06/21/16  Yes [name], MD  Insulin Glargine (LANTUS SOLOSTAR) 100 UNIT/ML Solostar Pen Inject 25-35 Units into the skin daily. Patient taking differently: Inject 25 Units into the skin daily.  02/24/16  Yes Cherre Robins, PharmD  levothyroxine (SYNTHROID, LEVOTHROID) 125 MCG tablet Take 1 tablet (125 mcg total) by mouth daily before breakfast. 05/25/16  Yes [name], MD  lisinopril (PRINIVIL,ZESTRIL) 10 MG tablet Take 10 mg by mouth daily. 06/25/16  Yes Historical Provider, MD  lisinopril (PRINIVIL,ZESTRIL) 40 MG tablet Take 1 tablet (40 mg total) by mouth daily. 05/25/16  Yes [name], MD  ciprofloxacin (CIPRO) 500 MG tablet Take 1 tablet (500 mg total) by mouth 2 (two) times daily. Patient not taking: Reported on 07/03/2016 06/21/16   [name], MD  metFORMIN (GLUCOPHAGE) 500 MG tablet Take 1 tablet (500 mg total) by mouth 2 (two) times daily with a meal. Patient not taking:  Reported on 07/03/2016 02/24/16   Cherre Robins, PharmD  pregabalin (LYRICA) 300 MG capsule 1 daily at bedtime Patient not taking: Reported on 07/03/2016 05/18/16   [name], MD  [place] X 15/64" 1 ML [facility]  12/22/15   Historical Provider, MD    Physical Exam: Vitals:   07/03/16 2200 07/03/16 2230 07/03/16 2353 07/04/16 0000  BP: 120/78 124/80 (!) 82/60 118/72  Pulse: 87 88 88 87  Resp: 22 21 14 15   Temp:      TempSrc:      SpO2: 100% 100% 100% 100%  Weight:      Height:        Constitutional: NAD, calm, comfortable Vitals:   07/03/16 2200 07/03/16 2230 07/03/16 2353 07/04/16 0000  BP: 120/78 124/80 (!) 82/60 118/72  Pulse: 87 88 88  87  Resp: 22 21 14 15   Temp:      TempSrc:      SpO2: 100% 100% 100% 100%  Weight:      Height:       Eyes: PERRL, lids and conjunctivae normal ENMT: Mucous membranes are moist. Posterior pharynx clear of any exudate or lesions.Normal dentition.  Neck: normal, supple, no masses, no thyromegaly Respiratory: clear to auscultation bilaterally, no wheezing, no crackles. Normal respiratory effort. No accessory muscle use.  Cardiovascular: Regular rate and rhythm, no murmurs / rubs / gallops. No extremity edema. 2+ pedal pulses. No carotid bruits.  Abdomen: no tenderness, no masses palpated. No hepatosplenomegaly. Bowel sounds positive.  Musculoskeletal: no clubbing / cyanosis. No joint deformity upper and lower extremities. Good ROM, no contractures. Normal muscle tone.  Skin: no rashes, lesions, ulcers. No induration Neurologic: CN 2-12 grossly intact. Sensation intact, DTR normal. Strength 5/5 in all 4.  Psychiatric: Normal judgment and insight. Alert and oriented x 3. Normal mood.    Labs on Admission: I have personally reviewed following labs and imaging studies  CBC:  Recent Labs Lab 07/03/16 2143  WBC 12.7*  NEUTROABS 9.4*  HGB 16.3  HCT 46.6  MCV 84.0  PLT 664*   Basic Metabolic Panel:  Recent Labs Lab 07/03/16 2143  NA 132*  K 4.8  CL 103  CO2 14*  GLUCOSE 98  BUN 120*  CREATININE 6.60*  CALCIUM 9.3   GFR: Estimated Creatinine Clearance: 11.4 mL/min (by C-G formula based on SCr of 6.6 mg/dL (H)).  Cardiac Enzymes:  Recent Labs Lab 07/03/16 2143  TROPONINI 0.05*    Radiological Exams on Admission: Dg Chest 2 View  Result Date: 07/03/2016 CLINICAL DATA:  Shortness of breath for 3-4 days. EXAM: CHEST  2 VIEW COMPARISON:  12/20/2015 FINDINGS: Surgical clips are again seen in the lower neck related the thyroidectomy. The cardiomediastinal silhouette is within normal limits. The lungs are well inflated and clear. There is no evidence of pleural effusion or  pneumothorax. No acute osseous abnormality is identified. IMPRESSION: No active cardiopulmonary disease. Electronically Signed   By: Logan Bores M.D.   On: 07/03/2016 20:36   Dg Foot Complete Left  Result Date: 07/03/2016 CLINICAL DATA:  Foot pain.  Pain for 3-4 days EXAM: LEFT FOOT - COMPLETE 3+ VIEW COMPARISON:  None. FINDINGS: No fracture or dislocation of mid foot or forefoot. The phalanges are normal. The calcaneus is normal. No soft tissue abnormality. The spleen is deformity. Benign-appearing chondroid lesion in the RIGHT tibia. IMPRESSION: No acute osseous abnormality. Electronically Signed   By: Suzy Bouchard M.D.   On: 07/03/2016 23:56   Dg  Foot Complete Right  Result Date: 07/03/2016 CLINICAL DATA:  Bilateral foot pain.  No injury. EXAM: RIGHT FOOT COMPLETE - 3+ VIEW COMPARISON:  None. FINDINGS: No fracture or dislocation of mid foot or forefoot. The phalanges are normal. The calcaneus is normal. No soft tissue abnormality. Pes planus deformity IMPRESSION: No acute osseous abnormality. Electronically Signed   By: Suzy Bouchard M.D.   On: 07/03/2016 23:55    Assessment/Plan 61 yo male with recent diagnosis of cellulitis and gout on colchicine/ace inhibitor/metformin with decreased po intake found to be with AKI  Principal Problem:   AKI (acute kidney injury) ([place])- ua is pending.  Pt orthostatic and dehydrated.  aki likely due to prerenal azotemia and ATN from decreased po intake in the setting of newly taking colchicine and continued use of ace inhibitor.  Give another liter ivf bolus, has received 1.5 liters thus far.  Is urinating fine.  No fever.  Hold all nephrotoxic substances.  If renal function does not improve with ivf, consider nephro consult.  Will also check renal ultrasound.  Active Problems:   Diabetes mellitus due to underlying condition with complications ([place])- hold metformin, ssi   HTN (hypertension)- holding ace inh.  Pt orthostatic at this time, hold all bp  meds   Hypothyroidism- check tsh level   Gout- check uric acid level, hold colchicine in setting of aki   Elevated troponin- likely elevated due to renal fxn, serial overnight   Cellulitis recently on 1/9 given cipro by PCP- this seems resolved after cipro.      DVT prophylaxis:  scds Code Status:  full Family Communication: none  Disposition Plan:  Per day team Consults called:  none Admission status:  admission   Keesha Pellum A MD [facility]  If 7PM-7AM, please contact night-coverage [email or web address] Password [facility]  07/04/2016, 12:52 AM

## 2016-07-04 NOTE — Progress Notes (Signed)
Initial Nutrition Assessment  INTERVENTION:  CHO Modified diet -  Glucerna Shake po TID, each supplement provides 220 kcal and 10 grams of protein  Meal preferences obtained  Add MVI   NUTRITION DIAGNOSIS:   Inadequate oral intake related to poor appetite as evidenced by meal completion < 50%.  GOAL:   Patient will meet greater than or equal to 90% of their needs  MONITOR:   PO intake, Supplement acceptance, Labs, Weight trends  REASON FOR ASSESSMENT:   Malnutrition Screening Tool    ASSESSMENT: Todd [name] is a 61 y.o. male with medical history significant of IDDM, CKD, HTN, recent dx of gout and cellulitis on 1/9 started on cipro and colchicine which he has been taking.  Pt states both of his toes were swollen when he saw his doctor and there was infection to the left foot.  He presented from home yesterday with c/o bilateral foot pain. The patient says he has not been eating well for the past week.  Very poor appetite. He lives with his spouse and they usually eat most meals at home. His meal pattern is 3 times daily. He has only taken bites <25% of his breakfast or lunch. We talked about what he feels like he may be able to eat and nutrition services was contacted to order his selections. Patients weight hx usual weight range 74-78 kg. He is at risk for significant weight loss due to his poor intake. His is agreeable to drink Glucerna while his appetite is recovering.  Nutrition-Focused physical exam findings unremarkable.  Labs: BUN 122, Cr 6.33, Sodium 131   Meds: reviewed/ IVF's- NS @ 125 ml/hr  Diet Order:  Diet Carb Modified Fluid consistency: Thin; Room service appropriate? Yes  Skin:  Reviewed, no issues  Last BM:  1/20-Type 7 watery, no solid pieces  Height:   Ht Readings from Last 1 Encounters:  07/04/16 5\' 8"  (1.727 m)    Weight:   Wt Readings from Last 1 Encounters:  07/04/16 164 lb 7.4 oz (74.6 kg)    Ideal Body Weight:  70 kg  BMI:  Body  mass index is 25.01 kg/m.  Estimated Nutritional Needs:   Kcal:  1900-2100  Protein:  65-70 gr   Fluid:  24-hr UOP + 500 ml for insensible losses  EDUCATION NEEDS:   No education needs identified at this time  Colman Cater MS,RD,CSG,LDN Office: #[phone] Pager: [phone]

## 2016-07-04 NOTE — Progress Notes (Signed)
PROGRESS NOTE    Todd [name]  XVQ:[id number] DOB: 21-Dec-1955 DOA: 07/03/2016 PCP: [name], MD   Brief Narrative: 61 y.o. male with medical history significant of IDDM, CKD, HTN, recent dx of gout and cellulitis on 1/9 started on cipro and colchicine which he has been taking presented with b/l foot pain. He has a decreased urinary output decreased oral intake and not feeling well recently. In the ER patient was found to have serum creatinine level over 6 and admitted for further evaluation.  Assessment & Plan:   # Acute on chronic kidney disease stage III likely prerenal/ATN in the setting of severe dehydration, NSAIDs, lisinopril. Patient was positive for orthostatic hypotension. -Patient admitted with serum creatinine level of 6.6 compared to last serum creatinine level of 1.3 about 2 weeks ago. Urinalysis with no active sediment, only protein of 30. I will check a spot urine protein creatinine ratio. -Ultrasound of kidneys with normal size and without hydronephrosis or focal abnormalities. -Continue IV fluid, avoid nephrotoxins. -Holding lisinopril -Monitor BMP and urine output. -Ultrasound of kidneys showed suboptimal distention of bladder and no exact measurement of urine output obtained therefore I'm planning to place Foley catheter.  # Type 2 diabetes: Continue sliding scale. Hold metformin. Monitor blood sugar level  #History of hypertension: Patient with positive orthostatic on admission. Holding lisinopril. Monitor blood pressure.  #Hypothyroidism: Continue Synthroid. TSH low. Check free t3 and free t4 level.  # Recent gout flare treated with colchicine: elevated uric acid also contributed by renal failure.  -pt has b/l feet pain.  -check b12 level   #Mild elevation in troponin due to renal failure. Patient has no chest pain.   Principal Problem:   AKI (acute kidney injury) ([place]) Active Problems:   Diabetes mellitus due to underlying condition with complications  ([place])   HTN (hypertension)   Hypothyroidism   Gout   Elevated troponin   Cellulitis recently on 1/9 given cipro by PCP   DVT prophylaxis: Heparin subcutaneous Code Status: Full code Family Communication: No family present at bedside Disposition Plan: Likely discharge home in 2-3 days.   Consultants: None Procedures: None Antimicrobials: None  Subjective: Patient was seen and examined at bedside. Patient reported bilateral feet pain. Denied nausea vomiting or abdominal pain. Reported decreased urine output and oral intake recently. Denied chest pain or shortness of breath.   Objective: Vitals:   07/04/16 0100 07/04/16 0200 07/04/16 0353 07/04/16 1504  BP: 148/84 112/77 113/71 (!) 173/75  Pulse: 85 85 86 87  Resp: 21 20 20 20   Temp:  97.9 F (36.6 C) 98.4 F (36.9 C) 98.1 F (36.7 C)  TempSrc:  Oral Axillary   SpO2: 100% 100% 100% 100%  Weight:  74.6 kg (164 lb 7.4 oz)    Height:  5\' 8"  (1.727 m)      Intake/Output Summary (Last 24 hours) at 07/04/16 1620 Last data filed at 07/04/16 1500  Gross per 24 hour  Intake           1262.5 ml  Output              702 ml  Net            560.5 ml   Filed Weights   07/03/16 1927 07/04/16 0200  Weight: 77.1 kg (170 lb) 74.6 kg (164 lb 7.4 oz)    Examination:  General exam: Appears calm and comfortable  Respiratory system: Clear to auscultation. Respiratory effort normal. No wheezing or crackle Cardiovascular system: S1 & S2  heard, RRR.  No pedal edema. Gastrointestinal system: Abdomen is nondistended, soft and nontender. Normal bowel sounds heard. Central nervous system: Alert and oriented. No focal neurological deficits. Extremities: Symmetric 5 x 5 power. Skin: No rashes, lesions or ulcers Psychiatry: Judgement and insight appear normal. Mood & affect appropriate.     Data Reviewed: I have personally reviewed following labs and imaging studies  CBC:  Recent Labs Lab 07/03/16 2143 07/04/16 0501  WBC 12.7* 12.1*    NEUTROABS 9.4*  --   HGB 16.3 15.4  HCT 46.6 44.7  MCV 84.0 83.4  PLT 859* 706*   Basic Metabolic Panel:  Recent Labs Lab 07/03/16 2143 07/04/16 0501  NA 132* 131*  K 4.8 4.8  CL 103 105  CO2 14* 12*  GLUCOSE 98 98  BUN 120* 122*  CREATININE 6.60* 6.33*  CALCIUM 9.3 8.4*   GFR: Estimated Creatinine Clearance: 11.9 mL/min (by C-G formula based on SCr of 6.33 mg/dL (H)). Liver Function Tests: No results for input(s): AST, ALT, ALKPHOS, BILITOT, PROT, ALBUMIN in the last 168 hours. No results for input(s): LIPASE, AMYLASE in the last 168 hours. No results for input(s): AMMONIA in the last 168 hours. Coagulation Profile: No results for input(s): INR, PROTIME in the last 168 hours. Cardiac Enzymes:  Recent Labs Lab 07/03/16 2143 07/04/16 0108 07/04/16 0825 07/04/16 1432  TROPONINI 0.05* 0.05* 0.05* 0.05*   BNP (last 3 results) No results for input(s): PROBNP in the last 8760 hours. HbA1C: No results for input(s): HGBA1C in the last 72 hours. CBG:  Recent Labs Lab 07/04/16 0208 07/04/16 0742 07/04/16 1149  GLUCAP 104* 90 84   Lipid Profile: No results for input(s): CHOL, HDL, LDLCALC, TRIG, CHOLHDL, LDLDIRECT in the last 72 hours. Thyroid Function Tests:  Recent Labs  07/03/16 2143  TSH 0.099*   Anemia Panel: No results for input(s): VITAMINB12, FOLATE, FERRITIN, TIBC, IRON, RETICCTPCT in the last 72 hours. Sepsis Labs:  Recent Labs Lab 07/04/16 0108 07/04/16 0501  LATICACIDVEN 0.9 1.0    Recent Results (from the past 240 hour(s))  C difficile quick scan w PCR reflex     Status: None   Collection Time: 07/04/16 11:03 AM  Result Value Ref Range Status   C Diff antigen NEGATIVE NEGATIVE Final   C Diff toxin NEGATIVE NEGATIVE Final   C Diff interpretation No C. difficile detected.  Final         Radiology Studies: Dg Chest 2 View  Result Date: 07/03/2016 CLINICAL DATA:  Shortness of breath for 3-4 days. EXAM: CHEST  2 VIEW COMPARISON:   12/20/2015 FINDINGS: Surgical clips are again seen in the lower neck related the thyroidectomy. The cardiomediastinal silhouette is within normal limits. The lungs are well inflated and clear. There is no evidence of pleural effusion or pneumothorax. No acute osseous abnormality is identified. IMPRESSION: No active cardiopulmonary disease. Electronically Signed   By: Logan Bores M.D.   On: 07/03/2016 20:36   US Renal  Result Date: 07/04/2016 CLINICAL DATA:  Acute kidney injury. History of hypertension and diabetes. EXAM: RENAL / URINARY TRACT ULTRASOUND COMPLETE COMPARISON:  None. FINDINGS: Right Kidney: Length: 11.8 cm. Echogenicity within normal limits. No mass or hydronephrosis visualized. Left Kidney: Length: 12.0 cm. Echogenicity within normal limits. No mass or hydronephrosis visualized. Bladder: Suboptimally distended.  No apparent abnormality. IMPRESSION: 1. Both kidneys are normal in size without hydronephrosis or focal abnormality. 2. The bladder appears unremarkable for its degree of distention. Electronically Signed   By: Gwyndolyn Saxon  Lin Landsman M.D.   On: 07/04/2016 09:18   Dg Foot Complete Left  Result Date: 07/03/2016 CLINICAL DATA:  Foot pain.  Pain for 3-4 days EXAM: LEFT FOOT - COMPLETE 3+ VIEW COMPARISON:  None. FINDINGS: No fracture or dislocation of mid foot or forefoot. The phalanges are normal. The calcaneus is normal. No soft tissue abnormality. The spleen is deformity. Benign-appearing chondroid lesion in the RIGHT tibia. IMPRESSION: No acute osseous abnormality. Electronically Signed   By: Suzy Bouchard M.D.   On: 07/03/2016 23:56   Dg Foot Complete Right  Result Date: 07/03/2016 CLINICAL DATA:  Bilateral foot pain.  No injury. EXAM: RIGHT FOOT COMPLETE - 3+ VIEW COMPARISON:  None. FINDINGS: No fracture or dislocation of mid foot or forefoot. The phalanges are normal. The calcaneus is normal. No soft tissue abnormality. Pes planus deformity IMPRESSION: No acute osseous abnormality.  Electronically Signed   By: Suzy Bouchard M.D.   On: 07/03/2016 23:55        Scheduled Meds: . feeding supplement (GLUCERNA SHAKE)  237 mL Oral BID BM  . levothyroxine  125 mcg Oral QAC breakfast   Continuous Infusions: . sodium chloride 125 mL/hr at 07/04/16 1318     LOS: 0 days    Todd Eickholt Tanna Furry, MD [facility] Pager [phone]  If 7PM-7AM, please contact night-coverage [email or web address] Password [email or web address] 07/04/2016, 4:20 PM

## 2016-07-05 DIAGNOSIS — E039 Hypothyroidism, unspecified: Secondary | ICD-10-CM

## 2016-07-05 LAB — GLUCOSE, CAPILLARY
GLUCOSE-CAPILLARY: 85 mg/dL (ref 65–99)
GLUCOSE-CAPILLARY: 160 mg/dL — AB (ref 65–99)
Glucose-Capillary: 144 mg/dL — ABNORMAL HIGH (ref 65–99)
Glucose-Capillary: 178 mg/dL — ABNORMAL HIGH (ref 65–99)

## 2016-07-05 LAB — BASIC METABOLIC PANEL
ANION GAP: 12 (ref 5–15)
BUN: 97 mg/dL — ABNORMAL HIGH (ref 6–20)
CALCIUM: 8.3 mg/dL — AB (ref 8.9–10.3)
CO2: 11 mmol/L — ABNORMAL LOW (ref 22–32)
Chloride: 111 mmol/L (ref 101–111)
Creatinine, Ser: 3.27 mg/dL — ABNORMAL HIGH (ref 0.61–1.24)
GFR, EST AFRICAN AMERICAN: 22 mL/min — AB (ref >60)
GFR, EST NON AFRICAN AMERICAN: 19 mL/min — AB (ref >60)
Glucose, Bld: 89 mg/dL (ref 65–99)
Potassium: 4.5 mmol/L (ref 3.5–5.1)
SODIUM: 134 mmol/L — AB (ref 135–145)

## 2016-07-05 LAB — MAGNESIUM: MAGNESIUM: 1.7 mg/dL (ref 1.7–2.4)

## 2016-07-05 LAB — T3, FREE: T3 FREE: 2.8 pg/mL (ref 2.0–4.4)

## 2016-07-05 MED ORDER — PREDNISONE 20 MG PO TABS
50.0000 mg | ORAL_TABLET | Freq: Every day | ORAL | Status: DC
  Administered 2016-07-05 – 2016-07-06 (×2): 50 mg via ORAL
  Filled 2016-07-05 (×2): qty 2

## 2016-07-05 MED ORDER — BOOST / RESOURCE BREEZE PO LIQD
1.0000 | Freq: Two times a day (BID) | ORAL | Status: DC
  Administered 2016-07-05 – 2016-07-06 (×2): 1 via ORAL

## 2016-07-05 MED ORDER — LEVOTHYROXINE SODIUM 100 MCG PO TABS
100.0000 ug | ORAL_TABLET | Freq: Every day | ORAL | Status: DC
  Administered 2016-07-06: 100 ug via ORAL
  Filled 2016-07-05: qty 1

## 2016-07-05 NOTE — Progress Notes (Signed)
PROGRESS NOTE    Todd [name]  VCB:[id number] DOB: 10/10/1955 DOA: 07/03/2016 PCP: [name], MD   Brief Narrative: 61 y.o. male with medical history significant of IDDM, CKD, HTN, recent dx of gout and cellulitis on 1/9 started on cipro and colchicine which he has been taking presented with b/l foot pain. He has a decreased urinary output decreased oral intake and not feeling well recently. In the ER patient was found to have serum creatinine level over 6 and admitted for further evaluation.  Assessment & Plan:   # Acute on chronic kidney disease stage III likely prerenal/ATN in the setting of severe dehydration, NSAIDs, lisinopril. Patient was positive for orthostatic hypotension on admission. -Patient admitted with serum creatinine level of 6.6 compared to last serum creatinine level of 1.3 about 2 weeks ago. Urinalysis with no active sediment, only protein of 30. Spot urine PCR of 0.31.  -Ultrasound of kidneys with normal size and without hydronephrosis or focal abnormalities. -serum creatinine level significantly improve to 3.2 today with IV fluid. Patient has excellent urine output. Continue IV fluid. Monitor BMP. Continue to hold lisinopril, avoid nephrotoxins. We will discontinue Foley catheter today. -If serum creatinine level continues to improve by tomorrow, he may be able to go home with outpatient follow-up.  # Type 2 diabetes: Continue sliding scale. Hold metformin. Monitor blood sugar level  #History of hypertension: Patient with positive orthostatic on admission. Holding lisinopril. Monitor blood pressure.  #Hypothyroidism: TSH low. Free T4 is elevated. I will reduce the dose of Synthroid to 100 g. Recommended to follow-up thyroid function test in 4-6 weeks with PCP.  # Recent gout flare treated with colchicine: elevated uric acid also contributed by renal failure.  -pt has b/l feet pain. Serum B12 level acceptable. -Foot x-rays with no acute osseous abnormalities. I  will try short course of prednisone to see the response -PT, OT evaluation  #Mild elevation in troponin due to renal failure. Patient has no chest pain.   Principal Problem:   AKI (acute kidney injury) ([place]) Active Problems:   Diabetes mellitus due to underlying condition with complications ([place])   HTN (hypertension)   Hypothyroidism   Renal failure   Gout   Elevated troponin   Cellulitis recently on 1/9 given cipro by PCP   DVT prophylaxis: Heparin subcutaneous Code Status: Full code Family Communication: No family present at bedside. Patient's friend at bedside. Disposition Plan: Likely discharge home in 1-2 days.   Consultants: None Procedures: None Antimicrobials: None  Subjective: Patient was seen and examined at bedside. Patient reported feeling much better today but still having bilateral feet pain, at the bottom mostly. Reports good energy level today. Denied nausea, vomiting, chest pain, shortness of breath, abdominal pain. Objective: Vitals:   07/04/16 0353 07/04/16 1504 07/04/16 2129 07/05/16 0607  BP: 113/71 (!) 173/75 (!) 160/75 (!) 128/91  Pulse: 86 87 84 91  Resp: 20 20 20 20   Temp: 98.4 F (36.9 C) 98.1 F (36.7 C) 97.9 F (36.6 C) 98 F (36.7 C)  TempSrc: Axillary  Oral Oral  SpO2: 100% 100% 100% 100%  Weight:      Height:        Intake/Output Summary (Last 24 hours) at 07/05/16 1042 Last data filed at 07/05/16 1000  Gross per 24 hour  Intake            932.5 ml  Output             3550 ml  Net          -  2617.5 ml   Filed Weights   07/03/16 1927 07/04/16 0200  Weight: 77.1 kg (170 lb) 74.6 kg (164 lb 7.4 oz)    Examination:  General exam: Not in distress, sitting on bed comfortable. Respiratory system: Clear bilateral, no wheezing or crackle Cardiovascular system: Regular rate rhythm, S1-S2 normal. Gastrointestinal system: Abdomen is nondistended, soft and nontender. Normal bowel sounds heard. Central nervous system: Alert and oriented.  No focal neurological deficits. Extremities: Symmetric 5 x 5 power. Skin: No rashes, lesions or ulcers Psychiatry: Judgement and insight appear normal. Mood & affect appropriate.     Data Reviewed: I have personally reviewed following labs and imaging studies  CBC:  Recent Labs Lab 07/03/16 2143 07/04/16 0501  WBC 12.7* 12.1*  NEUTROABS 9.4*  --   HGB 16.3 15.4  HCT 46.6 44.7  MCV 84.0 83.4  PLT 859* 629*   Basic Metabolic Panel:  Recent Labs Lab 07/03/16 2143 07/04/16 0501 07/05/16 0552  NA 132* 131* 134*  K 4.8 4.8 4.5  CL 103 105 111  CO2 14* 12* 11*  GLUCOSE 98 98 89  BUN 120* 122* 97*  CREATININE 6.60* 6.33* 3.27*  CALCIUM 9.3 8.4* 8.3*  MG  --   --  1.7   GFR: Estimated Creatinine Clearance: 23 mL/min (by C-G formula based on SCr of 3.27 mg/dL (H)). Liver Function Tests: No results for input(s): AST, ALT, ALKPHOS, BILITOT, PROT, ALBUMIN in the last 168 hours. No results for input(s): LIPASE, AMYLASE in the last 168 hours. No results for input(s): AMMONIA in the last 168 hours. Coagulation Profile: No results for input(s): INR, PROTIME in the last 168 hours. Cardiac Enzymes:  Recent Labs Lab 07/03/16 2143 07/04/16 0108 07/04/16 0825 07/04/16 1432  TROPONINI 0.05* 0.05* 0.05* 0.05*   BNP (last 3 results) No results for input(s): PROBNP in the last 8760 hours. HbA1C: No results for input(s): HGBA1C in the last 72 hours. CBG:  Recent Labs Lab 07/04/16 0742 07/04/16 1149 07/04/16 1626 07/04/16 2117 07/05/16 0725  GLUCAP 90 84 108* 118* 85   Lipid Profile: No results for input(s): CHOL, HDL, LDLCALC, TRIG, CHOLHDL, LDLDIRECT in the last 72 hours. Thyroid Function Tests:  Recent Labs  07/03/16 2143 07/04/16 1700  TSH 0.099*  --   FREET4  --  1.62*  T3FREE  --  2.8   Anemia Panel:  Recent Labs  07/04/16 1700  VITAMINB12 280   Sepsis Labs:  Recent Labs Lab 07/04/16 0108 07/04/16 0501  LATICACIDVEN 0.9 1.0    Recent  Results (from the past 240 hour(s))  C difficile quick scan w PCR reflex     Status: None   Collection Time: 07/04/16 11:03 AM  Result Value Ref Range Status   C Diff antigen NEGATIVE NEGATIVE Final   C Diff toxin NEGATIVE NEGATIVE Final   C Diff interpretation No C. difficile detected.  Final         Radiology Studies: Dg Chest 2 View  Result Date: 07/03/2016 CLINICAL DATA:  Shortness of breath for 3-4 days. EXAM: CHEST  2 VIEW COMPARISON:  12/20/2015 FINDINGS: Surgical clips are again seen in the lower neck related the thyroidectomy. The cardiomediastinal silhouette is within normal limits. The lungs are well inflated and clear. There is no evidence of pleural effusion or pneumothorax. No acute osseous abnormality is identified. IMPRESSION: No active cardiopulmonary disease. Electronically Signed   By: Logan Bores M.D.   On: 07/03/2016 20:36   US Renal  Result Date: 07/04/2016 CLINICAL DATA:  Acute kidney injury. History of hypertension and diabetes. EXAM: RENAL / URINARY TRACT ULTRASOUND COMPLETE COMPARISON:  None. FINDINGS: Right Kidney: Length: 11.8 cm. Echogenicity within normal limits. No mass or hydronephrosis visualized. Left Kidney: Length: 12.0 cm. Echogenicity within normal limits. No mass or hydronephrosis visualized. Bladder: Suboptimally distended.  No apparent abnormality. IMPRESSION: 1. Both kidneys are normal in size without hydronephrosis or focal abnormality. 2. The bladder appears unremarkable for its degree of distention. Electronically Signed   By: Richardean Sale M.D.   On: 07/04/2016 09:18   Dg Foot Complete Left  Result Date: 07/03/2016 CLINICAL DATA:  Foot pain.  Pain for 3-4 days EXAM: LEFT FOOT - COMPLETE 3+ VIEW COMPARISON:  None. FINDINGS: No fracture or dislocation of mid foot or forefoot. The phalanges are normal. The calcaneus is normal. No soft tissue abnormality. The spleen is deformity. Benign-appearing chondroid lesion in the RIGHT tibia. IMPRESSION: No  acute osseous abnormality. Electronically Signed   By: Suzy Bouchard M.D.   On: 07/03/2016 23:56   Dg Foot Complete Right  Result Date: 07/03/2016 CLINICAL DATA:  Bilateral foot pain.  No injury. EXAM: RIGHT FOOT COMPLETE - 3+ VIEW COMPARISON:  None. FINDINGS: No fracture or dislocation of mid foot or forefoot. The phalanges are normal. The calcaneus is normal. No soft tissue abnormality. Pes planus deformity IMPRESSION: No acute osseous abnormality. Electronically Signed   By: Suzy Bouchard M.D.   On: 07/03/2016 23:55        Scheduled Meds: . feeding supplement  1 Container Oral BID BM  . heparin  5,000 Units Subcutaneous Q8H  . levothyroxine  125 mcg Oral QAC breakfast  . multivitamin with minerals  1 tablet Oral Daily   Continuous Infusions: . sodium chloride 125 mL/hr at 07/05/16 0555     LOS: 1 day    Todd Beauchamp Tanna Furry, MD [facility] Pager [phone]  If 7PM-7AM, please contact night-coverage [email or web address] Password [facility] 07/05/2016, 10:42 AM

## 2016-07-05 NOTE — Evaluation (Signed)
Physical Therapy Evaluation Patient Details Name: Todd [name] MRN: [id number] DOB: 1955/08/10 Today's Date: 07/05/2016   History of Present Illness  61 y.o. male with medical history significant of IDDM, CKD, HTN, recent dx of gout and cellulitis on 1/9 started on cipro and colchicine which he has been taking.  Pt states both of his toes were swollen when he saw his doctor and there was infection to the left foot.  The rash/infection has gone, and the swelling to his toes has resolved he is just still having pain to bottom of this feet for the past week which is the reason he comes in to the ED.  He says the last couple of days he has not been feeling right, not eating or drinking a lot, just feeling bad.  No fevers.  No swelling anywhere.  He has been sob, no cough.  No pain anywhere except in his feet (which has only been present a week).  He has been urinating normally, denies any dysuria or change in urinary symptoms.  No abdominal pain.  No n/v/d.  Pt found to have a cr over 6 which is markedly changed since last week and referred for admission for his acute renal failure.    Clinical Impression  Pt received sitting up on the EOB, mother present, and pt is agreeable to PT evaluation.  Pt expressed that normally he ambulates independently, and he is independent with dressing, bathing, driving, and is still working.  During PT evaluation today, he expressed that his feet are very swollen, and extremely painful.  Pt is very slow with all mobility.  He eventually required Mod A for sit<>stand from elevated bed with RW.  He demonstrates extensive weight bearing through his UE's on the RW.  He was only able to take 5 steps forward and 5 steps back.  He was very fatigued by this.  Educated pt on keeping LE's elevated to reduce edema.  At this point, he is a high fall risk due to need for increased assistance, and decreased gait speed.  He would greatly benefit from SNF.  If they choose to return home  instead, he would need 24/7 supervision/assistance, RW, w/c, BSC and HHPT.      Follow Up Recommendations Home health PT;Supervision/Assistance - 24 hour;SNF    Equipment Recommendations  Rolling walker with 5" wheels;3in1 (PT);Wheelchair (measurements PT);Wheelchair cushion (measurements PT)    Recommendations for Other Services       Precautions / Restrictions Precautions Precautions: Fall Precaution Comments: Due to immobility as well as decreased gait speed Restrictions Weight Bearing Restrictions: No      Mobility  Bed Mobility                  Transfers Overall transfer level: Needs assistance Equipment used: Straight cane;Rolling walker (2 wheeled) Transfers: Sit to/from Stand Sit to Stand: Mod assist;From elevated surface         General transfer comment: Pt requires increased time to scoot hips forward while sitting on the EOB.   Pt attempted to stand with cane, however, pt is not able to shift weight forward and come to full upright position.  Therefore, RW used and bed height raised.  Pt was able to rock back and forth prior to standing with Mod A.  Increased time to gain balance once upright.  Pt was able to perform weight shifting right and left , and then perform standing marching alternating.    Ambulation/Gait Ambulation/Gait assistance: Min assist Ambulation Distance (Feet):  5 Feet Assistive device: Rolling walker (2 wheeled) Gait Pattern/deviations: Step-to pattern;Trunk flexed   Gait velocity interpretation: <1.8 ft/sec, indicative of risk for recurrent falls General Gait Details: Pt relies heavily on the RW for weight bearing through his UE's due to pain in B feet.  Pt requries 1 step cues for gait sequencing.  He was able to ambulate 5 steps forward and 5 steps back to the bed.  Pt encouraged to allow his UE's to relax..    Stairs            Wheelchair Mobility    Modified Rankin (Stroke Patients Only)       Balance Overall balance  assessment: Needs assistance Sitting-balance support: Bilateral upper extremity supported;Feet supported Sitting balance-Leahy Scale: Good     Standing balance support: Bilateral upper extremity supported Standing balance-Leahy Scale: Poor Standing balance comment: strong weight bearing through UE's through the RW.                              Pertinent Vitals/Pain Pain Assessment: 0-10 Pain Score: 10-Worst pain ever Pain Location: B feet  and it catches.  Pain Descriptors / Indicators: Stabbing    Home Living   Living Arrangements: Parent (mother)   Type of Home: House Home Access: Level entry     Home Layout: One level Home Equipment: Other (comment) (walking stick)      Prior Function Level of Independence: Independent   Gait / Transfers Assistance Needed: Pt is normally independent with ambulation.   ADL's / Homemaking Assistance Needed: independent with dressing, and bathing.  Still driving.  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, newspaper   Dominant Hand: Right    Extremity/Trunk Assessment   Upper Extremity Assessment Upper Extremity Assessment: Generalized weakness    Lower Extremity Assessment Lower Extremity Assessment: Generalized weakness    Cervical / Trunk Assessment Cervical / Trunk Assessment: Kyphotic  Communication   Communication: No difficulties  Cognition Arousal/Alertness: Awake/alert Behavior During Therapy: WFL for tasks assessed/performed Overall Cognitive Status: Within Functional Limits for tasks assessed                      General Comments      Exercises     Assessment/Plan    PT Assessment Patient needs continued PT services  PT Problem List Decreased strength;Decreased activity tolerance;Decreased balance;Decreased mobility;Decreased coordination;Decreased knowledge of use of DME;Decreased safety awareness;Decreased knowledge of precautions;Impaired  sensation;Pain          PT Treatment Interventions DME instruction;Gait training;Functional mobility training;Therapeutic activities;Therapeutic exercise;Balance training;Patient/family education;Wheelchair mobility training    PT Goals (Current goals can be found in the Care Plan section)  Acute Rehab PT Goals Patient Stated Goal: To go home and have less pain.  PT Goal Formulation: With patient Time For Goal Achievement: 07/12/16 Potential to Achieve Goals: Fair    Frequency Min 4X/week   Barriers to discharge        Co-evaluation               End of Session Equipment Utilized During Treatment: Gait belt Activity Tolerance: Patient limited by pain Patient left: in bed;with call bell/phone within reach;with family/visitor present (Educated pt on keeping his LE's elevated) Nurse Communication: Mobility status Lattie Haw, RN notified of pt's mobiltiy status.  Mobility sheet left up in the room. )  Time: 3009-7949 PT Time Calculation (min) (ACUTE ONLY): 49 min   Charges:   PT Evaluation $PT Eval Low Complexity: 1 Procedure PT Treatments $Gait Training: 8-22 mins $Therapeutic Activity: 8-22 mins   PT G Codes:        Beth Andie Mortimer, PT, DPT X: [phone]

## 2016-07-06 DIAGNOSIS — N179 Acute kidney failure, unspecified: Secondary | ICD-10-CM

## 2016-07-06 LAB — BASIC METABOLIC PANEL
Anion gap: 12 (ref 5–15)
BUN: 68 mg/dL — AB (ref 6–20)
CALCIUM: 7.7 mg/dL — AB (ref 8.9–10.3)
CHLORIDE: 112 mmol/L — AB (ref 101–111)
CO2: 12 mmol/L — ABNORMAL LOW (ref 22–32)
CREATININE: 2.01 mg/dL — AB (ref 0.61–1.24)
GFR, EST AFRICAN AMERICAN: 39 mL/min — AB (ref >60)
GFR, EST NON AFRICAN AMERICAN: 34 mL/min — AB (ref >60)
Glucose, Bld: 112 mg/dL — ABNORMAL HIGH (ref 65–99)
Potassium: 4.8 mmol/L (ref 3.5–5.1)
SODIUM: 136 mmol/L (ref 135–145)

## 2016-07-06 LAB — GLUCOSE, CAPILLARY
GLUCOSE-CAPILLARY: 100 mg/dL — AB (ref 65–99)
GLUCOSE-CAPILLARY: 135 mg/dL — AB (ref 65–99)

## 2016-07-06 MED ORDER — LISINOPRIL 10 MG PO TABS: 10.0000 mg | ORAL_TABLET | Freq: Every day | ORAL | 0 refills | Status: DC

## 2016-07-06 MED ORDER — CARVEDILOL 3.125 MG PO TABS: 3.1250 mg | ORAL_TABLET | Freq: Two times a day (BID) | ORAL | 0 refills | Status: DC

## 2016-07-06 MED ORDER — ACETAMINOPHEN 325 MG PO TABS: 650.0000 mg | ORAL_TABLET | Freq: Four times a day (QID) | ORAL | 0 refills | Status: DC | PRN

## 2016-07-06 MED ORDER — METHYLPREDNISOLONE SODIUM SUCC 125 MG IJ SOLR
80.0000 mg | Freq: Once | INTRAMUSCULAR | Status: AC
  Administered 2016-07-06: 80 mg via INTRAVENOUS
  Filled 2016-07-06: qty 2

## 2016-07-06 MED ORDER — METHYLPREDNISOLONE 4 MG PO TBPK: ORAL_TABLET | ORAL | 0 refills | Status: DC

## 2016-07-06 MED ORDER — LEVOTHYROXINE SODIUM 100 MCG PO TABS: 100.0000 ug | ORAL_TABLET | Freq: Every day | ORAL | 0 refills | Status: DC

## 2016-07-06 MED ORDER — COLCHICINE 0.6 MG PO TABS: 0.6000 mg | ORAL_TABLET | Freq: Every day | ORAL | 0 refills | Status: DC

## 2016-07-06 NOTE — Evaluation (Signed)
Occupational Therapy Evaluation Patient Details Name: Todd [name] MRN: [id number] DOB: 1955-11-02 Today's Date: 07/06/2016    History of Present Illness 61 y.o. male with medical history significant of IDDM, CKD, HTN, recent dx of gout and cellulitis on 1/9 started on cipro and colchicine which he has been taking.  Pt states both of his toes were swollen when he saw his doctor and there was infection to the left foot.  The rash/infection has gone, and the swelling to his toes has resolved he is just still having pain to bottom of this feet for the past week which is the reason he comes in to the ED.  He says the last couple of days he has not been feeling right, not eating or drinking a lot, just feeling bad.  No fevers.  No swelling anywhere.  He has been sob, no cough.  No pain anywhere except in his feet (which has only been present a week).  He has been urinating normally, denies any dysuria or change in urinary symptoms.  No abdominal pain.  No n/v/d.  Pt found to have a cr over 6 which is markedly changed since last week and referred for admission for his acute renal failure.     Clinical Impression   Pt awake, alert, oriented x4 this am, agreeable to OT evaluation. PTA pt independent in B/IADL tasks, working 12 hour shifts, and driving. During evaluation pt requires significantly increased time for ADL completion at bed level, unable to tolerate standing for ADL completion. Also requires assistance for transfers and functional mobility (see below). Pt would benefit from SNF on discharge to improve strength as well as independence and safety with ADL and functional mobility tasks; pt's mother will not be able to provide physical assistance for pt. HHOT is recommended if pt is not agreeable to SNF, and pt will need BSC and shower seat. Will continue to follow while in acute care.     Follow Up Recommendations  Home health OT;Supervision/Assistance - 24 hour;SNF    Equipment  Recommendations  3 in 1 bedside commode;Tub/shower seat       Precautions / Restrictions Precautions Precautions: Fall Precaution Comments: Due to immobility as well as decreased gait speed Restrictions Weight Bearing Restrictions: No      Mobility Bed Mobility Overal bed mobility: Modified Independent             General bed mobility comments: Significantly increased time required for bed mobility tasks supine to sitting at EOB. Pt required min assist and verbal cuing to scoot forward for feet to touch floor once seated at EOB.   Transfers Overall transfer level: Needs assistance Equipment used: Rolling walker (2 wheeled) Transfers: Sit to/from Stand Sit to Stand: Mod assist;From elevated surface         General transfer comment: Increased time required for sit to stand, pt required mod assist with rocking forward to stand up, increased time required for balance on standing. Pt able to take 3 steps foward and then pivot to sit in chair         ADL Overall ADL's : Needs assistance/impaired Eating/Feeding: Set up;Sitting                   Lower Body Dressing: Supervision/safety;Bed level Lower Body Dressing Details (indicate cue type and reason): Increased time required   Toilet Transfer Details (indicate cue type and reason): Pt used urinal at bed level, increased time required  Functional mobility during ADLs: Minimal assistance;Rolling walker General ADL Comments: Significantly increased time required for ADL completion at bed level and sitting at EOB. Pt unable to tolerate ADL completion in standing     Vision Vision Assessment?: No apparent visual deficits          Pertinent Vitals/Pain Pain Assessment: Faces Faces Pain Scale: Hurts whole lot Pain Location: B feet Pain Descriptors / Indicators: Stabbing Pain Intervention(s): Limited activity within patient's tolerance;Monitored during session;Repositioned     Hand Dominance Right    Extremity/Trunk Assessment Upper Extremity Assessment Upper Extremity Assessment: Generalized weakness   Lower Extremity Assessment Lower Extremity Assessment: Generalized weakness   Cervical / Trunk Assessment Cervical / Trunk Assessment: Kyphotic   Communication Communication Communication: No difficulties   Cognition Arousal/Alertness: Awake/alert Behavior During Therapy: WFL for tasks assessed/performed Overall Cognitive Status: Within Functional Limits for tasks assessed                                Home Living Family/patient expects to be discharged to:: Private residence Living Arrangements: Parent (mother) Available Help at Discharge: Family;Available PRN/intermittently Type of Home: House Home Access: Level entry     Home Layout: One level     Bathroom Shower/Tub: Teacher, early years/pre: Handicapped height     Home Equipment:  (walking stick)          Prior Functioning/Environment Level of Independence: Independent  Gait / Transfers Assistance Needed: Pt is normally independent with ambulation.  ADL's / Homemaking Assistance Needed: Independent with B/IADL tasks, works-drives forklift, 12 hour shifts            OT Problem List: Decreased strength;Decreased activity tolerance;Impaired balance (sitting and/or standing);Pain;Decreased safety awareness;Decreased knowledge of use of DME or AE   OT Treatment/Interventions: Self-care/ADL training;Therapeutic exercise;Therapeutic activities;Patient/family education    OT Goals(Current goals can be found in the care plan section) Acute Rehab OT Goals Patient Stated Goal: To have less pain in feet OT Goal Formulation: With patient Time For Goal Achievement: 07/20/16 Potential to Achieve Goals: Good  OT Frequency: Min 2X/week    End of Session Equipment Utilized During Treatment: Gait belt;Rolling walker  Activity Tolerance: Patient limited by pain Patient left: in chair;with  call bell/phone within reach   Time: 0826-0852 OT Time Calculation (min): 26 min Charges:  OT General Charges $OT Visit: 1 Procedure OT Evaluation $OT Eval Low Complexity: 1 Procedure Guadelupe Sabin, OTR/L  [phone] 07/06/2016, 9:02 AM

## 2016-07-06 NOTE — Care Management Note (Signed)
Case Management Note  Patient Details  Name: Hanan Moen MRN: [id number] Date of Birth: 1955/06/30    Expected Discharge Date:  07/06/16               Expected Discharge Plan:  [place]  In-House Referral:     Discharge planning Services  CM Consult  Post Acute Care Choice:  Home Health Choice offered to:  Patient  DME Arranged:  3-N-1, Wheelchair manual DME Agency:  [place]:  [facility] Agency:  [place]  Status of Service:  Completed, signed off  If discussed at [place] of Stay Meetings, dates discussed:    Additional Comments: Patient discharging today. Declines SNF. Offered choice of [place] agencies. [place] [facility] notified and will obtain orders from chart for [place], [place], and 3 in 1. Patient aware DME will be delivered to room prior to discharge.   Elyssia Strausser, Chauncey [place], RN 07/06/2016, 12:02 PM

## 2016-07-06 NOTE — Discharge Summary (Signed)
[name] MMN:[id number] DOB: 21-Feb-1956 DOA: 07/03/2016  PCP: [name], MD  Admit date: 07/03/2016  Discharge date: 07/06/2016  Admitted From: Home   Disposition:  Home   Recommendations for Outpatient Follow-up:   Follow up with PCP in 1-2 weeks  PCP Please obtain BMP/CBC, 2 view CXR in 1week,  (see Discharge instructions)   PCP Please follow up on the following pending results: Check TSH in 4-6 weeks and monitor without   Home Health: PT   Equipment/Devices: Walker  Consultations: None Discharge Condition: Stable   CODE STATUS: Full   Diet Recommendation: Diet Carb Modified   Heart Healthy    Chief Complaint  Patient presents with  . Shortness of Breath     Brief history of present illness from the day of admission and additional interim summary    61 y.o.malewith medical history significant of IDDM, CKD 4, HTN, recent dx of gout and cellulitis on 1/9 started on cipro and colchicine which he has been taking presented with b/l foot pain. He has a decreased urinary output decreased oral intake and not feeling well recently. In the ER patient was found to have serum creatinine level over 6 and admitted for further evaluation.  Hospital issues addressed     ARF on CKD 4 - due to colchicine induced diarrhea, dehydration, NSAID& ACE  Use, resolved with IVF, creat back to baseline, good Urine output and stable Renal US.  DM 2 - continue Home Rx.  Hypothyroidism - TSH was low and Free T4 was high, synthroid dose dropped, PCP to recheck TSH in 4-6 weeks.  Recent gout flare. Reduced colchicine dose, uric acid is elevated however in the setting of renal failure this could be misleading, x-rays of both feet stable, he was placed on steroids with good effect, we'll give him Medrol Dosepak and reduced  dose colchicine upon discharge. PCP to monitor.  Mild non-ACS pattern flat troponin rise. Due to renal failure. No chest pain no acute issues.  History of hypertension. He was taking 50 mg of lisinopril cut down to 10 mg, have added Coreg. PCP to monitor BMP and blood pressure closely.  Deconditioning and generalized weakness. Home PT and rolling walker.  Discharge diagnosis     Principal Problem:   AKI (acute kidney injury) ([place]) Active Problems:   Diabetes mellitus due to underlying condition with complications (HCC)   HTN (hypertension)   Hypothyroidism   Renal failure   Gout   Elevated troponin   Cellulitis recently on 1/9 given cipro by PCP    Discharge instructions    Discharge Instructions    Discharge instructions    Complete by:  As directed    Follow with Primary MD [name],WARREN, MD in 62 days   Get CBC, CMP, 2 view Chest X ray checked  by Primary MD or SNF MD in 5-7 days ( we routinely change or add medications that can affect your baseline labs and fluid status, therefore we recommend that you get the mentioned basic workup next visit with your  PCP, your PCP may decide not to get them or add new tests based on their clinical decision)   Activity: As tolerated with Full fall precautions use walker/cane & assistance as needed   Disposition Home    Diet:   Diet Carb Modified  Heart Healthy    For Heart failure patients - Check your Weight same time everyday, if you gain over 2 pounds, or you develop in leg swelling, experience more shortness of breath or chest pain, call your Primary MD immediately. Follow Cardiac Low Salt Diet and 1.5 lit/day fluid restriction.   On your next visit with your primary care physician please Get Medicines reviewed and adjusted.   Please request your Prim.MD to go over all Hospital Tests and Procedure/Radiological results at the follow up, please get all Hospital records sent to your Prim MD by signing hospital release before you  go home.   If you experience worsening of your admission symptoms, develop shortness of breath, life threatening emergency, suicidal or homicidal thoughts you must seek medical attention immediately by calling 911 or calling your MD immediately  if symptoms less severe.  You Must read complete instructions/literature along with all the possible adverse reactions/side effects for all the Medicines you take and that have been prescribed to you. Take any new Medicines after you have completely understood and accpet all the possible adverse reactions/side effects.   Do not drive, operate heavy machinery, perform activities at heights, swimming or participation in water activities or provide baby sitting services if your were admitted for syncope or siezures until you have seen by Primary MD or a Neurologist and advised to do so again.  Do not drive when taking Pain medications.    Do not take more than prescribed Pain, Sleep and Anxiety Medications  Special Instructions: If you have smoked or chewed Tobacco  in the last 2 yrs please stop smoking, stop any regular Alcohol  and or any Recreational drug use.  Wear Seat belts while driving.   Please note  You were cared for by a hospitalist during your hospital stay. If you have any questions about your discharge medications or the care you received while you were in the hospital after you are discharged, you can call the unit and asked to speak with the hospitalist on call if the hospitalist that took care of you is not available. Once you are discharged, your primary care physician will handle any further medical issues. Please note that NO REFILLS for any discharge medications will be authorized once you are discharged, as it is imperative that you return to your primary care physician (or establish a relationship with a primary care physician if you do not have one) for your aftercare needs so that they can reassess your need for medications and  monitor your lab values.   Increase activity slowly    Complete by:  As directed       Discharge Medications   Allergies as of 07/06/2016   No Known Allergies     Medication List    STOP taking these medications   ciprofloxacin 500 MG tablet Commonly known as:  CIPRO     TAKE these medications   acetaminophen 325 MG tablet Commonly known as:  TYLENOL Take 2 tablets (650 mg total) by mouth every 6 (six) hours as needed for mild pain or moderate pain.   carvedilol 3.125 MG tablet Commonly known as:  COREG Take 1 tablet (3.125 mg total) by mouth 2 (two) times daily  with a meal.   colchicine 0.6 MG tablet Take 1 tablet (0.6 mg total) by mouth daily. What changed:  how much to take  how to take this  when to take this  additional instructions   Insulin Glargine 100 UNIT/ML Solostar Pen Commonly known as:  LANTUS SOLOSTAR Inject 25-35 Units into the skin daily. What changed:  how much to take   levothyroxine 100 MCG tablet Commonly known as:  SYNTHROID, LEVOTHROID Take 1 tablet (100 mcg total) by mouth daily before breakfast. Start taking on:  07/07/2016 What changed:  medication strength  how much to take   lisinopril 10 MG tablet Commonly known as:  PRINIVIL,ZESTRIL Take 1 tablet (10 mg total) by mouth daily. What changed:  Another medication with the same name was removed. Continue taking this medication, and follow the directions you see here.   metFORMIN 500 MG tablet Commonly known as:  GLUCOPHAGE Take 1 tablet (500 mg total) by mouth 2 (two) times daily with a meal.   methylPREDNISolone 4 MG Tbpk tablet Commonly known as:  MEDROL DOSEPAK follow package directions   pregabalin 300 MG capsule Commonly known as:  LYRICA 1 daily at bedtime   RELION INSULIN SYRINGE 31G X 15/64" 1 ML Misc Generic drug:  Insulin Syringe-Needle U-100            Durable Medical Equipment        Start     Ordered   07/06/16 0933  For home use only DME Walker  rolling  Once    Comments:  5 wheel  Question:  Patient needs a walker to treat with the following condition  Answer:  Weakness   07/06/16 0933      Follow-up [place], MD. Schedule an appointment as soon as possible for a visit in 1 week(s).   Specialty:  Family Medicine Contact information: [place] [address] [phone]           Major procedures and Radiology Reports - PLEASE review detailed and final reports thoroughly  -         Dg Chest 2 View  Result Date: 07/03/2016 CLINICAL DATA:  Shortness of breath for 3-4 days. EXAM: CHEST  2 VIEW COMPARISON:  12/20/2015 FINDINGS: Surgical clips are again seen in the lower neck related the thyroidectomy. The cardiomediastinal silhouette is within normal limits. The lungs are well inflated and clear. There is no evidence of pleural effusion or pneumothorax. No acute osseous abnormality is identified. IMPRESSION: No active cardiopulmonary disease. Electronically Signed   By: Logan Bores M.D.   On: 07/03/2016 20:36   US Renal  Result Date: 07/04/2016 CLINICAL DATA:  Acute kidney injury. History of hypertension and diabetes. EXAM: RENAL / URINARY TRACT ULTRASOUND COMPLETE COMPARISON:  None. FINDINGS: Right Kidney: Length: 11.8 cm. Echogenicity within normal limits. No mass or hydronephrosis visualized. Left Kidney: Length: 12.0 cm. Echogenicity within normal limits. No mass or hydronephrosis visualized. Bladder: Suboptimally distended.  No apparent abnormality. IMPRESSION: 1. Both kidneys are normal in size without hydronephrosis or focal abnormality. 2. The bladder appears unremarkable for its degree of distention. Electronically Signed   By: Richardean Sale M.D.   On: 07/04/2016 09:18   Dg Foot Complete Left  Result Date: 07/03/2016 CLINICAL DATA:  Foot pain.  Pain for 3-4 days EXAM: LEFT FOOT - COMPLETE 3+ VIEW COMPARISON:  None. FINDINGS: No fracture or dislocation of mid foot or forefoot. The  phalanges are normal. The calcaneus is  normal. No soft tissue abnormality. The spleen is deformity. Benign-appearing chondroid lesion in the RIGHT tibia. IMPRESSION: No acute osseous abnormality. Electronically Signed   By: Suzy Bouchard M.D.   On: 07/03/2016 23:56   Dg Foot Complete Right  Result Date: 07/03/2016 CLINICAL DATA:  Bilateral foot pain.  No injury. EXAM: RIGHT FOOT COMPLETE - 3+ VIEW COMPARISON:  None. FINDINGS: No fracture or dislocation of mid foot or forefoot. The phalanges are normal. The calcaneus is normal. No soft tissue abnormality. Pes planus deformity IMPRESSION: No acute osseous abnormality. Electronically Signed   By: Suzy Bouchard M.D.   On: 07/03/2016 23:55    Micro Results     Recent Results (from the past 240 hour(s))  C difficile quick scan w PCR reflex     Status: None   Collection Time: 07/04/16 11:03 AM  Result Value Ref Range Status   C Diff antigen NEGATIVE NEGATIVE Final   C Diff toxin NEGATIVE NEGATIVE Final   C Diff interpretation No C. difficile detected.  Final    Today   Subjective    Todd [name] today has no headache,no chest abdominal pain,no new weakness tingling or numbness, feels much better wants to go home today.    Objective   Blood pressure 122/68, pulse 87, temperature 97.8 F (36.6 C), temperature source Oral, resp. rate 20, height 5\' 8"  (1.727 m), weight 74.6 kg (164 lb 7.4 oz), SpO2 100 %.   Intake/Output Summary (Last 24 hours) at 07/06/16 0945 Last data filed at 07/06/16 0940  Gross per 24 hour  Intake          3570.42 ml  Output             2325 ml  Net          1245.42 ml    Exam Awake Alert, Oriented x 3, No new F.N deficits, Normal affect Todd [name],PERRAL Supple Neck,No JVD, No cervical lymphadenopathy appriciated.  Symmetrical Chest wall movement, Good air movement bilaterally, CTAB RRR,No Gallops,Rubs or new Murmurs, No Parasternal Heave +ve B.Sounds, Abd Soft, Non tender, No organomegaly appriciated,  No rebound -guarding or rigidity. No Cyanosis, Clubbing or edema, No new Rash or bruise   Data Review   CBC w Diff:  Lab Results  Component Value Date   WBC 12.1 (H) 07/04/2016   HGB 15.4 07/04/2016   HCT 44.7 07/04/2016   HCT 45.1 06/21/2016   PLT 794 (H) 07/04/2016   PLT 591 (H) 06/21/2016   LYMPHOPCT 18 07/03/2016   MONOPCT 7 07/03/2016   EOSPCT 1 07/03/2016   BASOPCT 0 07/03/2016    CMP:  Lab Results  Component Value Date   NA 136 07/06/2016   NA 143 06/21/2016   K 4.8 07/06/2016   CL 112 (H) 07/06/2016   CO2 12 (L) 07/06/2016   BUN 68 (H) 07/06/2016   BUN 26 06/21/2016   CREATININE 2.01 (H) 07/06/2016   PROT 8.8 (H) 06/21/2016   ALBUMIN 4.1 06/21/2016   BILITOT 0.7 06/21/2016   ALKPHOS 82 06/21/2016   AST 54 (H) 06/21/2016   ALT 47 (H) 06/21/2016  .   Total Time in preparing paper work, data evaluation and todays exam - 35 minutes  Thurnell Lose M.D on 07/06/2016 at 9:45 AM  [facility]   Office  [phone]

## 2016-07-06 NOTE — Discharge Instructions (Signed)
Follow with Primary MD [name], MD in 7 days   Get CBC, CMP, 2 view Chest X ray checked  by Primary MD or SNF MD in 5-7 days ( we routinely change or add medications that can affect your baseline labs and fluid status, therefore we recommend that you get the mentioned basic workup next visit with your PCP, your PCP may decide not to get them or add new tests based on their clinical decision)   Activity: As tolerated with Full fall precautions use walker/cane & assistance as needed   Disposition Home    Diet:   Diet Carb Modified  Heart Healthy    For Heart failure patients - Check your Weight same time everyday, if you gain over 2 pounds, or you develop in leg swelling, experience more shortness of breath or chest pain, call your Primary MD immediately. Follow Cardiac Low Salt Diet and 1.5 lit/day fluid restriction.   On your next visit with your primary care physician please Get Medicines reviewed and adjusted.   Please request your Prim.MD to go over all Hospital Tests and Procedure/Radiological results at the follow up, please get all Hospital records sent to your Prim MD by signing hospital release before you go home.   If you experience worsening of your admission symptoms, develop shortness of breath, life threatening emergency, suicidal or homicidal thoughts you must seek medical attention immediately by calling 911 or calling your MD immediately  if symptoms less severe.  You Must read complete instructions/literature along with all the possible adverse reactions/side effects for all the Medicines you take and that have been prescribed to you. Take any new Medicines after you have completely understood and accpet all the possible adverse reactions/side effects.   Do not drive, operate heavy machinery, perform activities at heights, swimming or participation in water activities or provide baby sitting services if your were admitted for syncope or siezures until you have seen by  Primary MD or a Neurologist and advised to do so again.  Do not drive when taking Pain medications.    Do not take more than prescribed Pain, Sleep and Anxiety Medications  Special Instructions: If you have smoked or chewed Tobacco  in the last 2 yrs please stop smoking, stop any regular Alcohol  and or any Recreational drug use.  Wear Seat belts while driving.   Please note  You were cared for by a hospitalist during your hospital stay. If you have any questions about your discharge medications or the care you received while you were in the hospital after you are discharged, you can call the unit and asked to speak with the hospitalist on call if the hospitalist that took care of you is not available. Once you are discharged, your primary care physician will handle any further medical issues. Please note that NO REFILLS for any discharge medications will be authorized once you are discharged, as it is imperative that you return to your primary care physician (or establish a relationship with a primary care physician if you do not have one) for your aftercare needs so that they can reassess your need for medications and monitor your lab values.

## 2016-07-06 NOTE — Progress Notes (Signed)
Patient has IV removed, site intact, and all prescriptions. Patient waiting on [facility] to deliver his equipment to hospital room. Then, he will go home with his ride.

## 2016-07-11 ENCOUNTER — Ambulatory Visit (INDEPENDENT_AMBULATORY_CARE_PROVIDER_SITE_OTHER): Payer: BLUE CROSS/BLUE SHIELD | Admitting: Family Medicine

## 2016-07-11 ENCOUNTER — Encounter: Payer: Self-pay | Admitting: Family Medicine

## 2016-07-11 VITALS — BP 91/54 | HR 3 | Temp 97.3°F | Ht 68.0 in | Wt 163.0 lb

## 2016-07-11 DIAGNOSIS — I129 Hypertensive chronic kidney disease with stage 1 through stage 4 chronic kidney disease, or unspecified chronic kidney disease: Secondary | ICD-10-CM | POA: Diagnosis not present

## 2016-07-11 DIAGNOSIS — E1142 Type 2 diabetes mellitus with diabetic polyneuropathy: Secondary | ICD-10-CM

## 2016-07-11 DIAGNOSIS — N183 Chronic kidney disease, stage 3 (moderate): Secondary | ICD-10-CM

## 2016-07-11 DIAGNOSIS — E1122 Type 2 diabetes mellitus with diabetic chronic kidney disease: Secondary | ICD-10-CM | POA: Diagnosis not present

## 2016-07-11 MED ORDER — INSULIN GLARGINE 100 UNIT/ML SOLOSTAR PEN: 25.0000 [IU] | PEN_INJECTOR | Freq: Every day | SUBCUTANEOUS | Status: DC

## 2016-07-11 MED ORDER — PREGABALIN 300 MG PO CAPS: ORAL_CAPSULE | ORAL | 5 refills | Status: DC

## 2016-07-11 MED ORDER — METFORMIN HCL 500 MG PO TABS: 500.0000 mg | ORAL_TABLET | Freq: Every day | ORAL | 2 refills | Status: DC

## 2016-07-11 NOTE — Progress Notes (Signed)
Subjective:  Patient ID: Todd [name], male    DOB: August 07, 1955  Age: 61 y.o. MRN: [id number]  CC: Hospitalization Follow-up   HPI Todd [name] presents for Hospital follow-up. He was admitted on 07/03/2016 and discharged on 07/06/2016. He had acute renal failure on top of chronic kidney disease level IV. This was felt to have been due to the use of colchicine. He was given IV fluids and creatinine resume normal and urine output was good he had a renal ultrasound that looked stable.At this point he is having pain in both feet. It is severe it feels like a hot iron is being held up against the plantar surface of each foot. The left is worse than the right. There is no known injury. He denies edema erythema and injury. The pain is so severe he can't walk without hobbling   History Todd [name] has a past medical history of Diabetes mellitus without complication ([place]); Goiter; Gout; Hyperlipidemia; Hypertension; and Thyroid disease.   He has a past surgical history that includes Thyroid surgery.   His family history includes Diabetes in his maternal aunt and maternal uncle; Heart disease in his mother; Pneumonia in his father.He reports that he has been smoking Cigarettes.  He has a 7.50 pack-year smoking history. He has never used smokeless tobacco. He reports that he drinks alcohol. He reports that he does not use drugs.    ROS Review of Systems  Constitutional: Negative for chills, diaphoresis and fever.  HENT: Negative for rhinorrhea and sore throat.   Respiratory: Negative for cough and shortness of breath.   Cardiovascular: Negative for chest pain.  Gastrointestinal: Negative for abdominal pain.  Musculoskeletal: Negative for arthralgias and myalgias.  Skin: Negative for rash.  Neurological: Negative for weakness and headaches.  Psychiatric/Behavioral: Negative for agitation and dysphoric mood.    Objective:  BP (!) 91/54   Pulse (!) 3   Temp 97.3 F (36.3 C) (Oral)   Ht 5'  8" (1.727 m)   Wt 163 lb (73.9 kg)   BMI 24.78 kg/m   BP Readings from Last 3 Encounters:  07/11/16 (!) 91/54  07/06/16 122/68  06/21/16 98/60    Wt Readings from Last 3 Encounters:  07/11/16 163 lb (73.9 kg)  07/04/16 164 lb 7.4 oz (74.6 kg)  06/21/16 181 lb (82.1 kg)     Physical Exam  Constitutional: He is oriented to person, place, and time. He appears well-developed and well-nourished. No distress.  HENT:  Head: Normocephalic and atraumatic.  Right Ear: External ear normal.  Left Ear: External ear normal.  Nose: Nose normal.  Mouth/Throat: Oropharynx is clear and moist.  Eyes: Conjunctivae and EOM are normal. Pupils are equal, round, and reactive to light.  Neck: Normal range of motion. Neck supple. No thyromegaly present.  Cardiovascular: Normal rate, regular rhythm and normal heart sounds.   No murmur heard. Pulmonary/Chest: Effort normal and breath sounds normal. No respiratory distress. He has no wheezes. He has no rales.  Abdominal: Soft. Bowel sounds are normal. He exhibits no distension. There is no tenderness.  Lymphadenopathy:    He has no cervical adenopathy.  Neurological: He is alert and oriented to person, place, and time. He has normal reflexes.  Skin: Skin is warm and dry.  Psychiatric: He has a normal mood and affect. His behavior is normal. Judgment and thought content normal.    Dg Chest 2 View  Result Date: 07/03/2016 CLINICAL DATA:  Shortness of breath for 3-4 days. EXAM: CHEST  2  VIEW COMPARISON:  12/20/2015 FINDINGS: Surgical clips are again seen in the lower neck related the thyroidectomy. The cardiomediastinal silhouette is within normal limits. The lungs are well inflated and clear. There is no evidence of pleural effusion or pneumothorax. No acute osseous abnormality is identified. IMPRESSION: No active cardiopulmonary disease. Electronically Signed   By: Logan Bores M.D.   On: 07/03/2016 20:36   US Renal  Result Date: 07/04/2016 CLINICAL  DATA:  Acute kidney injury. History of hypertension and diabetes. EXAM: RENAL / URINARY TRACT ULTRASOUND COMPLETE COMPARISON:  None. FINDINGS: Right Kidney: Length: 11.8 cm. Echogenicity within normal limits. No mass or hydronephrosis visualized. Left Kidney: Length: 12.0 cm. Echogenicity within normal limits. No mass or hydronephrosis visualized. Bladder: Suboptimally distended.  No apparent abnormality. IMPRESSION: 1. Both kidneys are normal in size without hydronephrosis or focal abnormality. 2. The bladder appears unremarkable for its degree of distention. Electronically Signed   By: Richardean Sale M.D.   On: 07/04/2016 09:18   Dg Foot Complete Left  Result Date: 07/03/2016 CLINICAL DATA:  Foot pain.  Pain for 3-4 days EXAM: LEFT FOOT - COMPLETE 3+ VIEW COMPARISON:  None. FINDINGS: No fracture or dislocation of mid foot or forefoot. The phalanges are normal. The calcaneus is normal. No soft tissue abnormality. The spleen is deformity. Benign-appearing chondroid lesion in the RIGHT tibia. IMPRESSION: No acute osseous abnormality. Electronically Signed   By: Suzy Bouchard M.D.   On: 07/03/2016 23:56   Dg Foot Complete Right  Result Date: 07/03/2016 CLINICAL DATA:  Bilateral foot pain.  No injury. EXAM: RIGHT FOOT COMPLETE - 3+ VIEW COMPARISON:  None. FINDINGS: No fracture or dislocation of mid foot or forefoot. The phalanges are normal. The calcaneus is normal. No soft tissue abnormality. Pes planus deformity IMPRESSION: No acute osseous abnormality. Electronically Signed   By: Suzy Bouchard M.D.   On: 07/03/2016 23:55    Assessment & Plan:   Todd [name] was seen today for hospitalization follow-up.  Diagnoses and all orders for this visit:  Type 2 DM with CKD stage 3 and hypertension ([place])  Diabetic peripheral neuropathy (HCC) -     CMP14+EGFR -     Uric acid  Other orders -     pregabalin (LYRICA) 300 MG capsule; 1 daily at bedtime. For burning in feet -     Insulin Glargine (LANTUS  SOLOSTAR) 100 UNIT/ML Solostar Pen; Inject 25 Units into the skin daily. -     metFORMIN (GLUCOPHAGE) 500 MG tablet; Take 1 tablet (500 mg total) by mouth daily with breakfast.   I have discontinued Mr. Skolnick RELION INSULIN SYRINGE and colchicine. I have also changed his pregabalin, Insulin Glargine, and metFORMIN. Additionally, I am having him maintain his lisinopril, acetaminophen, carvedilol, methylPREDNISolone, and levothyroxine.  Allergies as of 07/11/2016   No Known Allergies     Medication List       Accurate as of 07/11/16  4:06 PM. Always use your most recent med list.          acetaminophen 325 MG tablet Commonly known as:  TYLENOL Take 2 tablets (650 mg total) by mouth every 6 (six) hours as needed for mild pain or moderate pain.   carvedilol 3.125 MG tablet Commonly known as:  COREG Take 1 tablet (3.125 mg total) by mouth 2 (two) times daily with a meal.   Insulin Glargine 100 UNIT/ML Solostar Pen Commonly known as:  LANTUS SOLOSTAR Inject 25 Units into the skin daily.   levothyroxine 100 MCG tablet  Commonly known as:  SYNTHROID, LEVOTHROID Take 1 tablet (100 mcg total) by mouth daily before breakfast.   lisinopril 10 MG tablet Commonly known as:  PRINIVIL,ZESTRIL Take 1 tablet (10 mg total) by mouth daily.   metFORMIN 500 MG tablet Commonly known as:  GLUCOPHAGE Take 1 tablet (500 mg total) by mouth daily with breakfast.   methylPREDNISolone 4 MG Tbpk tablet Commonly known as:  MEDROL DOSEPAK follow package directions   pregabalin 300 MG capsule Commonly known as:  LYRICA 1 daily at bedtime. For burning in feet        Follow-up: Return in about 1 month (around 08/10/2016), or if symptoms worsen or fail to improve.  [name], M.D.

## 2016-07-12 LAB — CMP14+EGFR
ALBUMIN: 4 g/dL (ref 3.6–4.8)
ALK PHOS: 98 IU/L (ref 39–117)
ALT: 65 IU/L — AB (ref 0–44)
AST: 43 IU/L — AB (ref 0–40)
Albumin/Globulin Ratio: 1 — ABNORMAL LOW (ref 1.2–2.2)
BILIRUBIN TOTAL: 0.5 mg/dL (ref 0.0–1.2)
BUN / CREAT RATIO: 21 (ref 10–24)
BUN: 31 mg/dL — AB (ref 8–27)
CHLORIDE: 100 mmol/L (ref 96–106)
CO2: 16 mmol/L — ABNORMAL LOW (ref 18–29)
CREATININE: 1.5 mg/dL — AB (ref 0.76–1.27)
Calcium: 7.8 mg/dL — ABNORMAL LOW (ref 8.6–10.2)
GFR calc Af Amer: 57 mL/min/{1.73_m2} — ABNORMAL LOW (ref >59)
GFR calc non Af Amer: 50 mL/min/{1.73_m2} — ABNORMAL LOW (ref >59)
GLUCOSE: 118 mg/dL — AB (ref 65–99)
Globulin, Total: 4.2 g/dL (ref 1.5–4.5)
Potassium: 4.8 mmol/L (ref 3.5–5.2)
Sodium: 137 mmol/L (ref 134–144)
Total Protein: 8.2 g/dL (ref 6.0–8.5)

## 2016-07-12 LAB — URIC ACID: URIC ACID: 9.2 mg/dL — AB (ref 3.7–8.6)

## 2016-07-13 ENCOUNTER — Telehealth: Payer: Self-pay | Admitting: Family Medicine

## 2016-07-13 NOTE — Telephone Encounter (Signed)
LMOVM to call back, medication may take up to a week to help

## 2016-07-14 ENCOUNTER — Ambulatory Visit: Payer: BLUE CROSS/BLUE SHIELD | Admitting: Family

## 2016-07-15 ENCOUNTER — Encounter: Payer: Self-pay | Admitting: Family Medicine

## 2016-07-15 ENCOUNTER — Ambulatory Visit (INDEPENDENT_AMBULATORY_CARE_PROVIDER_SITE_OTHER): Payer: BLUE CROSS/BLUE SHIELD | Admitting: Family Medicine

## 2016-07-15 VITALS — BP 125/77 | HR 88 | Temp 97.6°F | Ht 68.0 in | Wt 167.8 lb

## 2016-07-15 DIAGNOSIS — G629 Polyneuropathy, unspecified: Secondary | ICD-10-CM | POA: Diagnosis not present

## 2016-07-15 NOTE — Patient Instructions (Addendum)
Great to see you!  Come back to see Dr. Livia Snellen next week if you are not better. We can try other medications.  We are working on a referral

## 2016-07-15 NOTE — Telephone Encounter (Signed)
Patient has been seen since phone call

## 2016-07-15 NOTE — Progress Notes (Signed)
   HPI  Patient presents today here to follow-up for foot pain.  Patient explains the pain started on hospitalization on 07/03/2016. He states the pain is burning in nature and limited to the bilateral soles of his feet. He started Lyrica with no improvement, however he's only taken the medication for 3 days. It does not cause somnolence.  The pain is so severe he cannot walk without assistance. He requests a note for work. His daughter is present and also asked me to consider treating with hydrocodone.  Patient and daughter are wondering if this will stop, and if that will not stop should be considered changing jobs.  PMH: Smoking status noted ROS: Per HPI  Objective: BP 125/77   Pulse 88   Temp 97.6 F (36.4 C) (Oral)   Ht 5\' 8"  (1.727 m)   Wt 167 lb 12.8 oz (76.1 kg)   BMI 25.51 kg/m  Gen: NAD, alert, cooperative with exam HEENT: NCAT, EOMI, PERRL CV: RRR, good S1/S2, no murmur Resp: CTABL, no wheezes, non-labored Abd: SNTND, BS present, no guarding or organomegaly Ext: No edema, warm Neuro: Alert and oriented, No gross deficits  Diabetic Foot Exam - Simple   Simple Foot Form Diabetic Foot exam was performed with the following findings:  Yes 07/15/2016 12:16 PM  Visual Inspection See comments:  Yes Sensation Testing Intact to touch and monofilament testing bilaterally:  Yes Pulse Check Posterior Tibialis and Dorsalis pulse intact bilaterally:  Yes Comments Thickened nails bilaterally left great toe is the most significant Thick scale and callus surrounding heel bilaterally, no concern for ulcers       Assessment and plan:  # Neuropathy Possibly diabetic neuropathy, however this is very unusual that it came on suddenly and is so persistent. Continue Lyrica for a few more days, discussed giving at least one week to begin helping Consider alternatives including gabapentin or SNRI like Cymbalta or Effexor. Given the severity of his pain in him considering missing  quite a bit of work because of that I have referred him to neurology. I have written a note for work releasing him for one week, recommended follow-up with his PCP next week to change medications if he is not having any improvement in pain.      Orders Placed This Encounter  Procedures  . Ambulatory referral to Neurology    Referral Priority:   Routine    Referral Type:   Consultation    Referral Reason:   Specialty Services Required    Requested Specialty:   Neurology    Number of Visits Requested:   [place], MD [place] 07/15/2016, 11:29 AM

## 2016-07-20 ENCOUNTER — Ambulatory Visit (INDEPENDENT_AMBULATORY_CARE_PROVIDER_SITE_OTHER): Payer: BLUE CROSS/BLUE SHIELD | Admitting: Family Medicine

## 2016-07-20 ENCOUNTER — Encounter: Payer: Self-pay | Admitting: Family Medicine

## 2016-07-20 VITALS — BP 104/65 | HR 68 | Temp 98.5°F | Ht 68.0 in

## 2016-07-20 DIAGNOSIS — E1122 Type 2 diabetes mellitus with diabetic chronic kidney disease: Secondary | ICD-10-CM | POA: Diagnosis not present

## 2016-07-20 DIAGNOSIS — E1142 Type 2 diabetes mellitus with diabetic polyneuropathy: Secondary | ICD-10-CM

## 2016-07-20 DIAGNOSIS — N179 Acute kidney failure, unspecified: Secondary | ICD-10-CM | POA: Diagnosis not present

## 2016-07-20 DIAGNOSIS — I10 Essential (primary) hypertension: Secondary | ICD-10-CM | POA: Diagnosis not present

## 2016-07-20 DIAGNOSIS — N183 Chronic kidney disease, stage 3 unspecified: Secondary | ICD-10-CM

## 2016-07-20 MED ORDER — PREGABALIN 200 MG PO CAPS: 200.0000 mg | ORAL_CAPSULE | Freq: Two times a day (BID) | ORAL | 1 refills | Status: DC

## 2016-07-20 MED ORDER — METHYLPREDNISOLONE 16 MG PO TABS: ORAL_TABLET | ORAL | 0 refills | Status: DC

## 2016-07-20 NOTE — Progress Notes (Signed)
Subjective:  Patient ID: Todd [name], male    DOB: April 21, 1956  Age: 61 y.o. MRN: [id number]  CC: Diabetes (pt here today c/o foot still hurting and he is unable to walk)   HPI Todd [name] presents forFollow-up of diabetes. Patient checks blood sugar at home.   98 fasting and 112 postprandial Feet hurt so bad he can't walk. Burning sensation. Feel full and swollen. Not weak. Left worse than right. Ongoing since last office visit. No relief from lyrica    HistoryMichael has a past medical history of Diabetes mellitus without complication ([place]); Goiter; Gout; Hyperlipidemia; Hypertension; and Thyroid disease.   He has a past surgical history that includes Thyroid surgery.   His family history includes Diabetes in his maternal aunt and maternal uncle; Heart disease in his mother; Pneumonia in his father.He reports that he has been smoking Cigarettes.  He has a 7.50 pack-year smoking history. He has never used smokeless tobacco. He reports that he drinks alcohol. He reports that he does not use drugs.  Current Outpatient Prescriptions on File Prior to Visit  Medication Sig Dispense Refill  . acetaminophen (TYLENOL) 325 MG tablet Take 2 tablets (650 mg total) by mouth every 6 (six) hours as needed for mild pain or moderate pain. 30 tablet 0  . carvedilol (COREG) 3.125 MG tablet Take 1 tablet (3.125 mg total) by mouth 2 (two) times daily with a meal. 60 tablet 0  . Insulin Glargine (LANTUS SOLOSTAR) 100 UNIT/ML Solostar Pen Inject 25 Units into the skin daily.    [name] levothyroxine (SYNTHROID, LEVOTHROID) 100 MCG tablet Take 1 tablet (100 mcg total) by mouth daily before breakfast. 30 tablet 0  . lisinopril (PRINIVIL,ZESTRIL) 10 MG tablet Take 1 tablet (10 mg total) by mouth daily. 30 tablet 0  . metFORMIN (GLUCOPHAGE) 500 MG tablet Take 1 tablet (500 mg total) by mouth daily with breakfast. 30 tablet 2   No current facility-administered medications on file prior to visit.      ROS Review of Systems  Constitutional: Negative for chills, diaphoresis and fever.  HENT: Negative for rhinorrhea and sore throat.   Respiratory: Negative for cough and shortness of breath.   Cardiovascular: Negative for chest pain.  Gastrointestinal: Negative for abdominal pain.  Musculoskeletal: Positive for arthralgias and myalgias.  Skin: Negative for rash.  Neurological: Positive for numbness (feet). Negative for weakness and headaches.    Objective:  BP 104/65   Pulse 68   Temp 98.5 F (36.9 C) (Oral)   Ht 5' 8"  (1.727 m)   BP Readings from Last 3 Encounters:  07/20/16 104/65  07/15/16 125/77  07/11/16 (!) 91/54    Wt Readings from Last 3 Encounters:  07/15/16 167 lb 12.8 oz (76.1 kg)  07/11/16 163 lb (73.9 kg)  07/04/16 164 lb 7.4 oz (74.6 kg)     Physical Exam  Constitutional: He is oriented to person, place, and time. He appears well-developed and well-nourished.  HENT:  Head: Normocephalic and atraumatic.  Right Ear: External ear normal.  Left Ear: External ear normal.  Mouth/Throat: No oropharyngeal exudate or posterior oropharyngeal erythema.  Eyes: Pupils are equal, round, and reactive to light.  Neck: Normal range of motion. Neck supple.  Cardiovascular: Normal rate and regular rhythm.   No murmur heard. Pulmonary/Chest: Breath sounds normal. No respiratory distress.  Musculoskeletal: He exhibits tenderness (Both feet. No edema noted.).  Neurological: He is alert and oriented to person, place, and time.  Vitals reviewed.   Lab Results  Component Value Date   HGBA1C 14.3 (H) 12/21/2015    Lab Results  Component Value Date   WBC 12.1 (H) 07/04/2016   HGB 15.4 07/04/2016   HCT 44.7 07/04/2016   PLT 794 (H) 07/04/2016   GLUCOSE 118 (H) 07/11/2016   CHOL 183 05/24/2016   TRIG 195 (H) 05/24/2016   HDL 41 05/24/2016   LDLCALC 103 (H) 05/24/2016   ALT 65 (H) 07/11/2016   AST 43 (H) 07/11/2016   NA 137 07/11/2016   K 4.8 07/11/2016   CL  100 07/11/2016   CREATININE 1.50 (H) 07/11/2016   BUN 31 (H) 07/11/2016   CO2 16 (L) 07/11/2016   TSH 0.099 (L) 07/03/2016   HGBA1C 14.3 (H) 12/21/2015     Assessment & Plan:   Brent was seen today for diabetes.  Diagnoses and all orders for this visit:  AKI (acute kidney injury) ([place]) -     CMP14+EGFR  Essential hypertension -     CMP14+EGFR  Type 2 diabetes mellitus with stage 3 chronic kidney disease, without long-term current use of insulin (HCC)  Diabetic peripheral neuropathy (HCC)  Other orders -     methylPREDNISolone (MEDROL) 16 MG tablet; 3 daily for 5 days then 2 daily for 5 days, then 1 daily -     pregabalin (LYRICA) 200 MG capsule; Take 1 capsule (200 mg total) by mouth 2 (two) times daily. 1 daily at bedtime. For burning in feet    Note to be out of work for 2 more weeks.  I have discontinued Mr. Dials methylPREDNISolone and COLCRYS. I have also changed his pregabalin. Additionally, I am having him start on methylPREDNISolone. Lastly, I am having him maintain his lisinopril, acetaminophen, carvedilol, levothyroxine, Insulin Glargine, and metFORMIN.  Meds ordered this encounter  Medications  . DISCONTD: COLCRYS 0.6 MG tablet    Refill:  0  . methylPREDNISolone (MEDROL) 16 MG tablet    Sig: 3 daily for 5 days then 2 daily for 5 days, then 1 daily    Dispense:  30 tablet    Refill:  0  . pregabalin (LYRICA) 200 MG capsule    Sig: Take 1 capsule (200 mg total) by mouth 2 (two) times daily. 1 daily at bedtime. For burning in feet    Dispense:  60 capsule    Refill:  1     Follow-up: Return in about 1 month (around 08/17/2016).  Todd [name], M.D.

## 2016-07-21 DIAGNOSIS — Z794 Long term (current) use of insulin: Secondary | ICD-10-CM | POA: Insufficient documentation

## 2016-07-21 DIAGNOSIS — E1122 Type 2 diabetes mellitus with diabetic chronic kidney disease: Secondary | ICD-10-CM | POA: Insufficient documentation

## 2016-07-21 DIAGNOSIS — N184 Chronic kidney disease, stage 4 (severe): Secondary | ICD-10-CM | POA: Insufficient documentation

## 2016-07-22 ENCOUNTER — Other Ambulatory Visit: Payer: BLUE CROSS/BLUE SHIELD

## 2016-07-25 ENCOUNTER — Ambulatory Visit: Payer: BLUE CROSS/BLUE SHIELD | Admitting: Family Medicine

## 2016-07-26 ENCOUNTER — Ambulatory Visit: Payer: BLUE CROSS/BLUE SHIELD | Admitting: Family Medicine

## 2016-07-28 ENCOUNTER — Telehealth: Payer: Self-pay | Admitting: Family Medicine

## 2016-07-28 NOTE — Telephone Encounter (Signed)
Spoke with Dr. Wendi Snipes concerning this pt; Instructed pt to keep his appointment with Dr. Livia Snellen on 08/03/2016 and we will try to get his neurology appointment moved up much sooner than April

## 2016-07-28 NOTE — Telephone Encounter (Signed)
Message attached to referral sent to [place] about having pt's appointment moved up.

## 2016-07-30 ENCOUNTER — Telehealth: Payer: Self-pay | Admitting: Family Medicine

## 2016-08-01 NOTE — Telephone Encounter (Signed)
Sister called back, understanding for pt to keep appt with Dr. Livia Snellen on Wednesday & that we are trying to get his appt with Dr. Posey Pronto at [facility] moved up from 09/23/16.

## 2016-08-01 NOTE — Telephone Encounter (Signed)
(  no reason for call) Call was from Saturday  LM for sister to call us back if she still needed anything.

## 2016-08-03 ENCOUNTER — Ambulatory Visit (INDEPENDENT_AMBULATORY_CARE_PROVIDER_SITE_OTHER): Payer: BLUE CROSS/BLUE SHIELD | Admitting: Family Medicine

## 2016-08-03 ENCOUNTER — Encounter: Payer: Self-pay | Admitting: Family Medicine

## 2016-08-03 VITALS — BP 99/65 | HR 99 | Temp 99.0°F | Ht 68.0 in

## 2016-08-03 DIAGNOSIS — E1142 Type 2 diabetes mellitus with diabetic polyneuropathy: Secondary | ICD-10-CM | POA: Diagnosis not present

## 2016-08-03 DIAGNOSIS — Z794 Long term (current) use of insulin: Secondary | ICD-10-CM

## 2016-08-03 DIAGNOSIS — IMO0002 Reserved for concepts with insufficient information to code with codable children: Secondary | ICD-10-CM

## 2016-08-03 DIAGNOSIS — R600 Localized edema: Secondary | ICD-10-CM

## 2016-08-03 DIAGNOSIS — I10 Essential (primary) hypertension: Secondary | ICD-10-CM | POA: Diagnosis not present

## 2016-08-03 DIAGNOSIS — E1121 Type 2 diabetes mellitus with diabetic nephropathy: Secondary | ICD-10-CM

## 2016-08-03 DIAGNOSIS — E1165 Type 2 diabetes mellitus with hyperglycemia: Secondary | ICD-10-CM

## 2016-08-03 MED ORDER — PREGABALIN 200 MG PO CAPS: 400.0000 mg | ORAL_CAPSULE | Freq: Every day | ORAL | 2 refills | Status: DC

## 2016-08-03 MED ORDER — TRIAMTERENE-HCTZ 37.5-25 MG PO TABS: 1.0000 | ORAL_TABLET | Freq: Every day | ORAL | 3 refills | Status: DC

## 2016-08-03 MED ORDER — AMOXICILLIN-POT CLAVULANATE 875-125 MG PO TABS: 1.0000 | ORAL_TABLET | Freq: Two times a day (BID) | ORAL | 0 refills | Status: DC

## 2016-08-03 NOTE — Progress Notes (Signed)
Subjective:  Patient ID: Todd [name], male    DOB: 1956-03-13  Age: 61 y.o. MRN: [id number]  CC: Foot Pain (pt here today following up on his foot pain and complications with his diabetes. He states he still is having trouble walking.)   HPI Rande Roylance presents forFollow-up of diabetes. Patient checks blood sugar at home.   98 fasting and 112 postprandial Feet hurt so bad he can't walk. Burning sensation. Feel full and swollen. Not weak. Left worse than right. Ongoing since last office visit. Some relief with LYrica. Pain reduced to 8/10 from 10/10. Pt. Brought in bottles. Lyrica still at 300 daily. Increase not made.     HistoryMichael has a past medical history of Diabetes mellitus without complication ([place]); Goiter; Gout; Hyperlipidemia; Hypertension; and Thyroid disease.   He has a past surgical history that includes Thyroid surgery.   His family history includes Diabetes in his maternal aunt and maternal uncle; Heart disease in his mother; Pneumonia in his father.He reports that he has been smoking Cigarettes.  He has a 7.50 pack-year smoking history. He has never used smokeless tobacco. He reports that he drinks alcohol. He reports that he does not use drugs.  Current Outpatient Prescriptions on File Prior to Visit  Medication Sig Dispense Refill  . acetaminophen (TYLENOL) 325 MG tablet Take 2 tablets (650 mg total) by mouth every 6 (six) hours as needed for mild pain or moderate pain. 30 tablet 0  . carvedilol (COREG) 3.125 MG tablet Take 1 tablet (3.125 mg total) by mouth 2 (two) times daily with a meal. 60 tablet 0  . Insulin Glargine (LANTUS SOLOSTAR) 100 UNIT/ML Solostar Pen Inject 25 Units into the skin daily.    [name] levothyroxine (SYNTHROID, LEVOTHROID) 100 MCG tablet Take 1 tablet (100 mcg total) by mouth daily before breakfast. 30 tablet 0  . lisinopril (PRINIVIL,ZESTRIL) 10 MG tablet Take 1 tablet (10 mg total) by mouth daily. 30 tablet 0  . metFORMIN (GLUCOPHAGE)  500 MG tablet Take 1 tablet (500 mg total) by mouth daily with breakfast. 30 tablet 2   No current facility-administered medications on file prior to visit.     ROS Review of Systems  Constitutional: Negative for chills, diaphoresis and fever.  HENT: Negative for rhinorrhea and sore throat.   Respiratory: Negative for cough and shortness of breath.   Cardiovascular: Negative for chest pain.  Gastrointestinal: Negative for abdominal pain.  Musculoskeletal: Positive for arthralgias and myalgias.  Skin: Negative for rash.  Neurological: Positive for numbness (feet). Negative for weakness and headaches.    Objective:  BP 99/65   Pulse 99   Temp 99 F (37.2 C) (Oral)   Ht _0  (1.727 m)   BP Readings from Last 3 Encounters:  08/05/16 139/70  08/03/16 99/65  07/20/16 104/65    Wt Readings from Last 3 Encounters:  07/15/16 167 lb 12.8 oz (76.1 kg)  07/11/16 163 lb (73.9 kg)  07/04/16 164 lb 7.4 oz (74.6 kg)     Physical Exam  Constitutional: He is oriented to person, place, and time. He appears well-developed and well-nourished.  HENT:  Head: Normocephalic and atraumatic.  Right Ear: External ear normal.  Left Ear: External ear normal.  Mouth/Throat: No oropharyngeal exudate or posterior oropharyngeal erythema.  Eyes: Pupils are equal, round, and reactive to light.  Neck: Normal range of motion. Neck supple.  Cardiovascular: Normal rate and regular rhythm.   No murmur heard. Pulmonary/Chest: Breath sounds normal. No respiratory distress.  Musculoskeletal:  He exhibits tenderness (Both feet. No edema noted.).  Neurological: He is alert and oriented to person, place, and time.  Vitals reviewed.   Lab Results  Component Value Date   HGBA1C 14.3 (H) 12/21/2015    Lab Results  Component Value Date   WBC 26.9 (HH) 08/05/2016   HGB 15.4 07/04/2016   HCT 36.1 (L) 08/05/2016   PLT 793 (H) 08/05/2016   GLUCOSE 86 08/03/2016   CHOL 183 05/24/2016   TRIG 195 (H)  05/24/2016   HDL 41 05/24/2016   LDLCALC 103 (H) 05/24/2016   ALT 25 08/03/2016   AST 36 08/03/2016   NA 136 08/03/2016   K 4.9 08/03/2016   CL 98 08/03/2016   CREATININE 1.41 (H) 08/03/2016   BUN 30 (H) 08/03/2016   CO2 15 (L) 08/03/2016   TSH 0.099 (L) 07/03/2016   HGBA1C 14.3 (H) 12/21/2015     Assessment & Plan:   E[place] was seen today for foot pain.  Diagnoses and all orders for this visit:  Diabetic peripheral neuropathy ([place]) -     CMP14+EGFR -     CBC with Differential/Platelet -     Ambulatory referral to Neurology  Essential hypertension -     CMP14+EGFR -     CBC with Differential/Platelet  Uncontrolled type 2 diabetes mellitus with diabetic nephropathy, with long-term current use of insulin ([place]) -     CMP14+EGFR -     CBC with Differential/Platelet -     Ambulatory referral to Vascular Surgery  Edema of leg -     CMP14+EGFR -     CBC with Differential/Platelet -     Brain natriuretic peptide -     Ambulatory referral to Vascular Surgery  Other orders -     amoxicillin-clavulanate (AUGMENTIN) 875-125 MG tablet; Take 1 tablet by mouth 2 (two) times daily. Take all of this medication -     triamterene-hydrochlorothiazide (MAXZIDE-25) 37.5-25 MG tablet; Take 1 tablet by mouth daily. For blood pressure and fluid -     pregabalin (LYRICA) 200 MG capsule; Take 2 capsules (400 mg total) by mouth daily.    Note to be out of work for 2 more weeks.  I have discontinued Mr. Klabunde methylPREDNISolone, pregabalin, and LYRICA. I am also having him start on amoxicillin-clavulanate, triamterene-hydrochlorothiazide, and pregabalin. Additionally, I am having him maintain his lisinopril, acetaminophen, carvedilol, levothyroxine, Insulin Glargine, metFORMIN, and COLCRYS.  Meds ordered this encounter  Medications  . DISCONTD: LYRICA 300 MG capsule    Sig: Take 300 mg by mouth daily.    Refill:  0  . COLCRYS 0.6 MG tablet    Sig: Take 0.6 mg by mouth 2 (two) times  daily.    Refill:  1  . amoxicillin-clavulanate (AUGMENTIN) 875-125 MG tablet    Sig: Take 1 tablet by mouth 2 (two) times daily. Take all of this medication    Dispense:  20 tablet    Refill:  0  . triamterene-hydrochlorothiazide (MAXZIDE-25) 37.5-25 MG tablet    Sig: Take 1 tablet by mouth daily. For blood pressure and fluid    Dispense:  90 tablet    Refill:  3  . pregabalin (LYRICA) 200 MG capsule    Sig: Take 2 capsules (400 mg total) by mouth daily.    Dispense:  60 capsule    Refill:  2     Follow-up: Return in about 2 weeks (around 08/17/2016).  [name], M.D.

## 2016-08-04 ENCOUNTER — Encounter (INDEPENDENT_AMBULATORY_CARE_PROVIDER_SITE_OTHER): Payer: Self-pay

## 2016-08-04 ENCOUNTER — Telehealth: Payer: Self-pay | Admitting: Family Medicine

## 2016-08-04 LAB — CMP14+EGFR
A/G RATIO: 0.6 — AB (ref 1.2–2.2)
ALT: 25 IU/L (ref 0–44)
AST: 36 IU/L (ref 0–40)
Albumin: 3.1 g/dL — ABNORMAL LOW (ref 3.6–4.8)
Alkaline Phosphatase: 122 IU/L — ABNORMAL HIGH (ref 39–117)
BILIRUBIN TOTAL: 0.8 mg/dL (ref 0.0–1.2)
BUN/Creatinine Ratio: 21 (ref 10–24)
BUN: 30 mg/dL — ABNORMAL HIGH (ref 8–27)
CALCIUM: 8.7 mg/dL (ref 8.6–10.2)
CHLORIDE: 98 mmol/L (ref 96–106)
CO2: 15 mmol/L — ABNORMAL LOW (ref 18–29)
Creatinine, Ser: 1.41 mg/dL — ABNORMAL HIGH (ref 0.76–1.27)
GFR, EST AFRICAN AMERICAN: 62 (ref >59)
GFR, EST NON AFRICAN AMERICAN: 53 — AB (ref >59)
GLOBULIN, TOTAL: 4.9 — AB (ref 1.5–4.5)
Glucose: 86 mg/dL (ref 65–99)
POTASSIUM: 4.9 mmol/L (ref 3.5–5.2)
SODIUM: 136 mmol/L (ref 134–144)
TOTAL PROTEIN: 8 g/dL (ref 6.0–8.5)

## 2016-08-04 LAB — CBC WITH DIFFERENTIAL/PLATELET
BASOS ABS: 0 10*3/uL (ref 0.0–0.2)
BASOS: 0 %
EOS (ABSOLUTE): 0.1 10*3/uL (ref 0.0–0.4)
Eos: 0 %
HEMATOCRIT: 36.3 % — AB (ref 37.5–51.0)
HEMOGLOBIN: 12.3 g/dL — AB (ref 13.0–17.7)
Immature Grans (Abs): 0.2 10*3/uL — ABNORMAL HIGH (ref 0.0–0.1)
Immature Granulocytes: 1 %
Lymphocytes Absolute: 2.8 10*3/uL (ref 0.7–3.1)
Lymphs: 11 %
MCH: 28.3 pg (ref 26.6–33.0)
MCHC: 33.9 g/dL (ref 31.5–35.7)
MCV: 83 fL (ref 79–97)
MONOS ABS: 1.8 10*3/uL — AB (ref 0.1–0.9)
Monocytes: 7 %
NEUTROS ABS: 19.7 10*3/uL — AB (ref 1.4–7.0)
Neutrophils: 81 %
Platelets: 749 10*3/uL — ABNORMAL HIGH (ref 150–379)
RBC: 4.35 x10E6/uL (ref 4.14–5.80)
RDW: 13.5 % (ref 12.3–15.4)
WBC: 24.6 10*3/uL (ref 3.4–10.8)

## 2016-08-04 LAB — BRAIN NATRIURETIC PEPTIDE: BNP: 118.6 pg/mL — ABNORMAL HIGH (ref 0.0–100.0)

## 2016-08-04 NOTE — Telephone Encounter (Signed)
Please contact the sister - Pt. Has some infection in the feet.Let him know that the neuro referral has been made. Just taking a while to get in.

## 2016-08-04 NOTE — Telephone Encounter (Signed)
Spoke with pt's sister regarding antibiotics She has some questions regarding pt's condition Please call

## 2016-08-04 NOTE — Telephone Encounter (Signed)
Please see note below, patient has appointment in April with neurologist.  Patient was placed on Augmentin at appointment yesterday, unsure why he is taking this.  Please advise.

## 2016-08-04 NOTE — Telephone Encounter (Signed)
Todd [name], God-sister, needs information on Todd [name].  Why is having to take an antibiotic.  [place] does not understand or know why he is on this.    Also, was told he was going to be referred to a neurologist.  Has not heard anything on this and needs to know if an appointment has been set up for him.  Going on 3 weeks now.   She helps manage his affairs.  Please call and clarify for them.

## 2016-08-04 NOTE — Telephone Encounter (Signed)
Discussed with Ms. Hairston. Pt. Is unable to walk due to neuropathy & PVD of LLE. He has a good case for disability. I do not see his leg improving in the future to the point of being able to support his weight to ambulate for any gainful employment.   Is it possible to find a different neuologist who can see him  Sooner than Dr. Posey Pronto?

## 2016-08-04 NOTE — Telephone Encounter (Signed)
Please call Todd [name]'s sister, Todd [name], at [phone].  They are concerned about Todd [name] and have some questions.  They would like to know why he was placed on an antibiotic yesterday and why this was not done before, your opinion on Todd [name] applying for social security disability, and would like to let you know he has been falling.  Have asked [place] to see if we can get the neurology appointment on 09/23/16 with Dr Posey Pronto any sooner.  I have contacted their office, he has been put on a cancellation list, but they are also going to talk to Dr. Posey Pronto and call me back to let us know if he can be seen sooner.

## 2016-08-05 ENCOUNTER — Encounter: Payer: Self-pay | Admitting: Family Medicine

## 2016-08-05 ENCOUNTER — Telehealth: Payer: Self-pay | Admitting: Family Medicine

## 2016-08-05 ENCOUNTER — Ambulatory Visit (INDEPENDENT_AMBULATORY_CARE_PROVIDER_SITE_OTHER): Payer: BLUE CROSS/BLUE SHIELD | Admitting: Family Medicine

## 2016-08-05 VITALS — BP 139/70 | HR 93 | Temp 98.8°F | Ht 68.0 in

## 2016-08-05 DIAGNOSIS — R8299 Other abnormal findings in urine: Secondary | ICD-10-CM

## 2016-08-05 DIAGNOSIS — L03119 Cellulitis of unspecified part of limb: Secondary | ICD-10-CM

## 2016-08-05 DIAGNOSIS — R82998 Other abnormal findings in urine: Secondary | ICD-10-CM

## 2016-08-05 LAB — URINALYSIS, COMPLETE
BILIRUBIN UA: NEGATIVE
Glucose, UA: NEGATIVE
LEUKOCYTES UA: NEGATIVE
Nitrite, UA: NEGATIVE
SPEC GRAV UA: 1.02 (ref 1.005–1.030)
Urobilinogen, Ur: 4 mg/dL — ABNORMAL HIGH (ref 0.2–1.0)
pH, UA: 5 (ref 5.0–7.5)

## 2016-08-05 LAB — MICROSCOPIC EXAMINATION
Bacteria, UA: NONE SEEN
Epithelial Cells (non renal): NONE SEEN /hpf (ref 0–10)
Renal Epithel, UA: NONE SEEN /hpf
WBC, UA: NONE SEEN /hpf (ref 0–?)

## 2016-08-05 NOTE — Telephone Encounter (Signed)
I received a phone call from [facility] at [facility] this morning.  They were able to move the patient's appointment up to 08/18/16 at 8:00 am and have spoken with his sister [name] about this.

## 2016-08-05 NOTE — Telephone Encounter (Signed)
Spoke with Ms Todd [name] to inform pt will need to keep appt for rck on pt's feet She verbalizes understanding

## 2016-08-05 NOTE — Patient Instructions (Signed)
Please bring in a fresh urine specimen as soon as possible.  Try using a probiotic such as Align to help reduce the number of bowel movements and keep them from being too watery  Please keep your appointment for next week.

## 2016-08-05 NOTE — Progress Notes (Signed)
Subjective:  Patient ID: Todd [name], male    DOB: 1956/05/26  Age: 61 y.o. MRN: [id number]  CC: Hematuria (pt here today c/o really dark urine and he thinks it has blood in it)   HPI Todd [name] presents for Some dark discoloration of urine. He is concerned that it might be blood. There has been no flank pain. No fever chills or sweats. Ultrasound of the kidneys recently performed. It was reviewed. It was free of any lesion that may be causing blood in the urine. It is appended below.   History Todd [name] has a past medical history of Diabetes mellitus without complication ([place]); Goiter; Gout; Hyperlipidemia; Hypertension; and Thyroid disease.   He has a past surgical history that includes Thyroid surgery.   His family history includes Diabetes in his maternal aunt and maternal uncle; Heart disease in his mother; Pneumonia in his father.He reports that he has been smoking Cigarettes.  He has a 33.75 pack-year smoking history. He has never used smokeless tobacco. He reports that he does not drink alcohol or use drugs.    ROS Review of Systems  Constitutional: Negative for chills, diaphoresis and fever.  HENT: Negative for rhinorrhea and sore throat.   Respiratory: Negative for cough and shortness of breath.   Cardiovascular: Negative for chest pain.  Gastrointestinal: Negative for abdominal pain.  Genitourinary: Negative for dysuria.  Musculoskeletal: Positive for arthralgias and myalgias.  Skin: Negative for rash.  Neurological: Positive for numbness (feet). Negative for weakness and headaches.    Objective:  BP 139/70   Pulse 93   Temp 98.8 F (37.1 C) (Oral)   Ht 5\' 8"  (1.727 m)   BP Readings from Last 3 Encounters:  08/09/16 (!) 108/55  08/05/16 139/70  08/03/16 99/65    Wt Readings from Last 3 Encounters:  08/08/16 156 lb 4.8 oz (70.9 kg)  07/15/16 167 lb 12.8 oz (76.1 kg)  07/11/16 163 lb (73.9 kg)     Physical Exam  Constitutional: He is oriented to  person, place, and time. He appears well-developed and well-nourished.  HENT:  Head: Normocephalic and atraumatic.  Right Ear: External ear normal.  Left Ear: External ear normal.  Mouth/Throat: No oropharyngeal exudate or posterior oropharyngeal erythema.  Eyes: Pupils are equal, round, and reactive to light.  Neck: Normal range of motion. Neck supple.  Cardiovascular: Normal rate and regular rhythm.   No murmur heard. Pulmonary/Chest: Breath sounds normal. No respiratory distress.  Musculoskeletal: He exhibits tenderness (Both feet.  Small edema noted..Unroofed blister at left lateral forefoot).  Neurological: He is alert and oriented to person, place, and time.  Vitals reviewed.  Results for orders placed or performed in visit on 08/05/16  Microscopic Examination  Result Value Ref Range   WBC, UA None seen 0 - 5 /hpf   RBC, UA 0-2 0 - 2 /hpf   Epithelial Cells (non renal) None seen 0 - 10 /hpf   Renal Epithel, UA None seen None seen /hpf   Mucus, UA Present Not Estab.   Bacteria, UA None seen None seen/Few  Urinalysis, Complete  Result Value Ref Range   Specific Gravity, UA 1.020 1.005 - 1.030   pH, UA 5.0 5.0 - 7.5   Color, UA Yellow Yellow   Appearance Ur Clear Clear   Leukocytes, UA Negative Negative   Protein, UA 2+ (A) Negative/Trace   Glucose, UA Negative Negative   Ketones, UA Trace (A) Negative   RBC, UA 2+ (A) Negative   Bilirubin, UA  Negative Negative   Urobilinogen, Ur 4.0 (H) 0.2 - 1.0 mg/dL   Nitrite, UA Negative Negative   Microscopic Examination See below:   CBC with Differential/Platelet  Result Value Ref Range   WBC 26.9 (HH) 3.4 - 10.8 x10E3/uL   RBC 4.32 4.14 - 5.80 x10E6/uL   Hemoglobin 12.5 (L) 13.0 - 17.7 g/dL   Hematocrit 36.1 (L) 37.5 - 51.0 %   MCV 84 79 - 97 fL   MCH 28.9 26.6 - 33.0 pg   MCHC 34.6 31.5 - 35.7 g/dL   RDW 13.8 12.3 - 15.4 %   Platelets 793 (H) 150 - 379 x10E3/uL   Neutrophils 77 Not Estab. %   Lymphs 15 Not Estab. %    Monocytes 6 Not Estab. %   Eos 0 Not Estab. %   Basos 0 Not Estab. %   Neutrophils Absolute 20.7 (H) 1.4 - 7.0 x10E3/uL   Lymphocytes Absolute 3.9 (H) 0.7 - 3.1 x10E3/uL   Monocytes Absolute 1.6 (H) 0.1 - 0.9 x10E3/uL   EOS (ABSOLUTE) 0.0 0.0 - 0.4 x10E3/uL   Basophils Absolute 0.1 0.0 - 0.2 x10E3/uL   Immature Granulocytes 2 Not Estab. %   Immature Grans (Abs) 0.5 (H) 0.0 - 0.1 x10E3/uL   Hematology Comments: Note:     US Renal  Result Date: 07/04/2016 CLINICAL DATA:  Acute kidney injury. History of hypertension and diabetes. EXAM: RENAL / URINARY TRACT ULTRASOUND COMPLETE COMPARISON:  None. FINDINGS: Right Kidney: Length: 11.8 cm. Echogenicity within normal limits. No mass or hydronephrosis visualized. Left Kidney: Length: 12.0 cm. Echogenicity within normal limits. No mass or hydronephrosis visualized. Bladder: Suboptimally distended.  No apparent abnormality. IMPRESSION: 1. Both kidneys are normal in size without hydronephrosis or focal abnormality. 2. The bladder appears unremarkable for its degree of distention. Electronically Signed   By: Richardean Sale M.D.   On: 07/04/2016 09:18   Dg Foot Complete Left  Result Date: 07/03/2016 CLINICAL DATA:  Foot pain.  Pain for 3-4 days EXAM: LEFT FOOT - COMPLETE 3+ VIEW COMPARISON:  None. FINDINGS: No fracture or dislocation of mid foot or forefoot. The phalanges are normal. The calcaneus is normal. No soft tissue abnormality. The spleen is deformity. Benign-appearing chondroid lesion in the RIGHT tibia. IMPRESSION: No acute osseous abnormality. Electronically Signed   By: Suzy Bouchard M.D.   On: 07/03/2016 23:56   Dg Foot Complete Right  Result Date: 07/03/2016 CLINICAL DATA:  Bilateral foot pain.  No injury. EXAM: RIGHT FOOT COMPLETE - 3+ VIEW COMPARISON:  None. FINDINGS: No fracture or dislocation of mid foot or forefoot. The phalanges are normal. The calcaneus is normal. No soft tissue abnormality. Pes planus deformity IMPRESSION: No acute  osseous abnormality. Electronically Signed   By: Suzy Bouchard M.D.   On: 07/03/2016 23:55    Assessment & Plan:   Todd [name] was seen today for hematuria.  Diagnoses and all orders for this visit:  Dark urine -     Urinalysis, Complete  Cellulitis of lower extremity, unspecified laterality -     CBC with Differential/Platelet  Other orders -     Microscopic Examination      I am having Todd [name] maintain his lisinopril, acetaminophen, carvedilol, levothyroxine, Insulin Glargine, metFORMIN, COLCRYS, amoxicillin-clavulanate, triamterene-hydrochlorothiazide, and pregabalin.  Allergies as of 08/05/2016   No Known Allergies     Medication List       Accurate as of 08/05/16 11:59 PM. Always use your most recent med list.  acetaminophen 325 MG tablet Commonly known as:  TYLENOL Take 2 tablets (650 mg total) by mouth every 6 (six) hours as needed for mild pain or moderate pain.   amoxicillin-clavulanate 875-125 MG tablet Commonly known as:  AUGMENTIN Take 1 tablet by mouth 2 (two) times daily. Take all of this medication   carvedilol 3.125 MG tablet Commonly known as:  COREG Take 1 tablet (3.125 mg total) by mouth 2 (two) times daily with a meal.   COLCRYS 0.6 MG tablet Generic drug:  colchicine Take 0.6 mg by mouth 2 (two) times daily.   Insulin Glargine 100 UNIT/ML Solostar Pen Commonly known as:  LANTUS SOLOSTAR Inject 25 Units into the skin daily.   levothyroxine 100 MCG tablet Commonly known as:  SYNTHROID, LEVOTHROID Take 1 tablet (100 mcg total) by mouth daily before breakfast.   lisinopril 10 MG tablet Commonly known as:  PRINIVIL,ZESTRIL Take 1 tablet (10 mg total) by mouth daily.   metFORMIN 500 MG tablet Commonly known as:  GLUCOPHAGE Take 1 tablet (500 mg total) by mouth daily with breakfast.   pregabalin 200 MG capsule Commonly known as:  LYRICA Take 2 capsules (400 mg total) by mouth daily.   triamterene-hydrochlorothiazide  37.5-25 MG tablet Commonly known as:  MAXZIDE-25 Take 1 tablet by mouth daily. For blood pressure and fluid      The dark colored urine is not present today and there is no significant amount of blood in his urine. Patient was reassured regarding this concern. We reviewed his problems with his feet and reviewed his x-rays showing no osseous abnormality. He has been started on antibiotics, Augmentin, due to developing cellulitis.  Follow-up: Return in about 1 week (around 08/12/2016).  [name], M.D.

## 2016-08-06 LAB — CBC WITH DIFFERENTIAL/PLATELET
BASOS ABS: 0.1 10*3/uL (ref 0.0–0.2)
Basos: 0 %
EOS (ABSOLUTE): 0 10*3/uL (ref 0.0–0.4)
EOS: 0 %
HEMOGLOBIN: 12.5 g/dL — AB (ref 13.0–17.7)
Hematocrit: 36.1 % — ABNORMAL LOW (ref 37.5–51.0)
IMMATURE GRANULOCYTES: 2 %
Immature Grans (Abs): 0.5 10*3/uL — ABNORMAL HIGH (ref 0.0–0.1)
Lymphocytes Absolute: 3.9 10*3/uL — ABNORMAL HIGH (ref 0.7–3.1)
Lymphs: 15 %
MCH: 28.9 pg (ref 26.6–33.0)
MCHC: 34.6 g/dL (ref 31.5–35.7)
MCV: 84 fL (ref 79–97)
MONOS ABS: 1.6 10*3/uL — AB (ref 0.1–0.9)
Monocytes: 6 %
NEUTROS PCT: 77 %
Neutrophils Absolute: 20.7 10*3/uL — ABNORMAL HIGH (ref 1.4–7.0)
PLATELETS: 793 10*3/uL — AB (ref 150–379)
RBC: 4.32 x10E6/uL (ref 4.14–5.80)
RDW: 13.8 % (ref 12.3–15.4)
WBC: 26.9 10*3/uL — AB (ref 3.4–10.8)

## 2016-08-08 ENCOUNTER — Emergency Department (HOSPITAL_COMMUNITY): Payer: BLUE CROSS/BLUE SHIELD

## 2016-08-08 ENCOUNTER — Inpatient Hospital Stay (HOSPITAL_COMMUNITY)
Admission: EM | Admit: 2016-08-08 | Discharge: 2016-08-19 | DRG: 854 | Disposition: A | Discharge status: Skilled Nursing Facility | Payer: BLUE CROSS/BLUE SHIELD | Attending: Internal Medicine | Admitting: Internal Medicine

## 2016-08-08 ENCOUNTER — Encounter (HOSPITAL_COMMUNITY): Payer: Self-pay | Admitting: Emergency Medicine

## 2016-08-08 ENCOUNTER — Telehealth: Payer: Self-pay | Admitting: Family Medicine

## 2016-08-08 DIAGNOSIS — I96 Gangrene, not elsewhere classified: Secondary | ICD-10-CM | POA: Diagnosis not present

## 2016-08-08 DIAGNOSIS — L03119 Cellulitis of unspecified part of limb: Secondary | ICD-10-CM

## 2016-08-08 DIAGNOSIS — L0390 Cellulitis, unspecified: Secondary | ICD-10-CM | POA: Diagnosis present

## 2016-08-08 DIAGNOSIS — E11628 Type 2 diabetes mellitus with other skin complications: Secondary | ICD-10-CM | POA: Diagnosis present

## 2016-08-08 DIAGNOSIS — R7989 Other specified abnormal findings of blood chemistry: Secondary | ICD-10-CM | POA: Diagnosis present

## 2016-08-08 DIAGNOSIS — E1165 Type 2 diabetes mellitus with hyperglycemia: Secondary | ICD-10-CM | POA: Diagnosis present

## 2016-08-08 DIAGNOSIS — N184 Chronic kidney disease, stage 4 (severe): Secondary | ICD-10-CM | POA: Diagnosis present

## 2016-08-08 DIAGNOSIS — E1152 Type 2 diabetes mellitus with diabetic peripheral angiopathy with gangrene: Secondary | ICD-10-CM | POA: Diagnosis present

## 2016-08-08 DIAGNOSIS — L03032 Cellulitis of left toe: Secondary | ICD-10-CM | POA: Diagnosis not present

## 2016-08-08 DIAGNOSIS — N179 Acute kidney failure, unspecified: Secondary | ICD-10-CM | POA: Diagnosis present

## 2016-08-08 DIAGNOSIS — IMO0002 Reserved for concepts with insufficient information to code with codable children: Secondary | ICD-10-CM | POA: Diagnosis present

## 2016-08-08 DIAGNOSIS — I70261 Atherosclerosis of native arteries of extremities with gangrene, right leg: Secondary | ICD-10-CM | POA: Diagnosis not present

## 2016-08-08 DIAGNOSIS — I10 Essential (primary) hypertension: Secondary | ICD-10-CM | POA: Diagnosis present

## 2016-08-08 DIAGNOSIS — E876 Hypokalemia: Secondary | ICD-10-CM | POA: Diagnosis not present

## 2016-08-08 DIAGNOSIS — I872 Venous insufficiency (chronic) (peripheral): Secondary | ICD-10-CM | POA: Diagnosis present

## 2016-08-08 DIAGNOSIS — I70234 Atherosclerosis of native arteries of right leg with ulceration of heel and midfoot: Secondary | ICD-10-CM | POA: Diagnosis not present

## 2016-08-08 DIAGNOSIS — M109 Gout, unspecified: Secondary | ICD-10-CM | POA: Diagnosis present

## 2016-08-08 DIAGNOSIS — F17210 Nicotine dependence, cigarettes, uncomplicated: Secondary | ICD-10-CM | POA: Diagnosis present

## 2016-08-08 DIAGNOSIS — I959 Hypotension, unspecified: Secondary | ICD-10-CM | POA: Diagnosis present

## 2016-08-08 DIAGNOSIS — E785 Hyperlipidemia, unspecified: Secondary | ICD-10-CM | POA: Diagnosis present

## 2016-08-08 DIAGNOSIS — E890 Postprocedural hypothyroidism: Secondary | ICD-10-CM | POA: Diagnosis present

## 2016-08-08 DIAGNOSIS — D649 Anemia, unspecified: Secondary | ICD-10-CM | POA: Diagnosis present

## 2016-08-08 DIAGNOSIS — I998 Other disorder of circulatory system: Secondary | ICD-10-CM | POA: Diagnosis present

## 2016-08-08 DIAGNOSIS — Z794 Long term (current) use of insulin: Secondary | ICD-10-CM | POA: Diagnosis present

## 2016-08-08 DIAGNOSIS — M7989 Other specified soft tissue disorders: Secondary | ICD-10-CM | POA: Diagnosis present

## 2016-08-08 DIAGNOSIS — E7800 Pure hypercholesterolemia, unspecified: Secondary | ICD-10-CM | POA: Diagnosis present

## 2016-08-08 DIAGNOSIS — L8990 Pressure ulcer of unspecified site, unspecified stage: Secondary | ICD-10-CM | POA: Insufficient documentation

## 2016-08-08 DIAGNOSIS — L97529 Non-pressure chronic ulcer of other part of left foot with unspecified severity: Secondary | ICD-10-CM | POA: Diagnosis present

## 2016-08-08 DIAGNOSIS — R609 Edema, unspecified: Secondary | ICD-10-CM | POA: Diagnosis present

## 2016-08-08 DIAGNOSIS — A419 Sepsis, unspecified organism: Principal | ICD-10-CM | POA: Diagnosis present

## 2016-08-08 DIAGNOSIS — L03115 Cellulitis of right lower limb: Secondary | ICD-10-CM | POA: Diagnosis present

## 2016-08-08 DIAGNOSIS — I70201 Unspecified atherosclerosis of native arteries of extremities, right leg: Secondary | ICD-10-CM | POA: Diagnosis present

## 2016-08-08 DIAGNOSIS — I70244 Atherosclerosis of native arteries of left leg with ulceration of heel and midfoot: Secondary | ICD-10-CM | POA: Diagnosis not present

## 2016-08-08 DIAGNOSIS — E1122 Type 2 diabetes mellitus with diabetic chronic kidney disease: Secondary | ICD-10-CM | POA: Diagnosis present

## 2016-08-08 DIAGNOSIS — Z79899 Other long term (current) drug therapy: Secondary | ICD-10-CM

## 2016-08-08 DIAGNOSIS — E11621 Type 2 diabetes mellitus with foot ulcer: Secondary | ICD-10-CM | POA: Diagnosis present

## 2016-08-08 DIAGNOSIS — E039 Hypothyroidism, unspecified: Secondary | ICD-10-CM | POA: Diagnosis not present

## 2016-08-08 DIAGNOSIS — E11649 Type 2 diabetes mellitus with hypoglycemia without coma: Secondary | ICD-10-CM | POA: Diagnosis not present

## 2016-08-08 DIAGNOSIS — E1142 Type 2 diabetes mellitus with diabetic polyneuropathy: Secondary | ICD-10-CM

## 2016-08-08 DIAGNOSIS — Z833 Family history of diabetes mellitus: Secondary | ICD-10-CM

## 2016-08-08 DIAGNOSIS — E1121 Type 2 diabetes mellitus with diabetic nephropathy: Secondary | ICD-10-CM

## 2016-08-08 DIAGNOSIS — Z8249 Family history of ischemic heart disease and other diseases of the circulatory system: Secondary | ICD-10-CM

## 2016-08-08 LAB — I-STAT TROPONIN, ED: TROPONIN I, POC: 0.01 ng/mL (ref 0.00–0.08)

## 2016-08-08 LAB — CBC WITH DIFFERENTIAL/PLATELET
Basophils Absolute: 0 K/uL (ref 0.0–0.1)
Basophils Relative: 0 %
Eosinophils Absolute: 0 K/uL (ref 0.0–0.7)
Eosinophils Relative: 0 %
HCT: 36.3 % — ABNORMAL LOW (ref 39.0–52.0)
Hemoglobin: 12.5 g/dL — ABNORMAL LOW (ref 13.0–17.0)
Lymphocytes Relative: 17 %
Lymphs Abs: 3.9 K/uL (ref 0.7–4.0)
MCH: 28.9 pg (ref 26.0–34.0)
MCHC: 34.4 g/dL (ref 30.0–36.0)
MCV: 84 fL (ref 78.0–100.0)
Monocytes Absolute: 1.1 K/uL — ABNORMAL HIGH (ref 0.1–1.0)
Monocytes Relative: 5 %
Neutro Abs: 17.7 K/uL — ABNORMAL HIGH (ref 1.7–7.7)
Neutrophils Relative %: 78 %
Platelets: 710 K/uL — ABNORMAL HIGH (ref 150–400)
RBC: 4.32 MIL/uL (ref 4.22–5.81)
RDW: 14 % (ref 11.5–15.5)
WBC: 22.7 K/uL — ABNORMAL HIGH (ref 4.0–10.5)

## 2016-08-08 LAB — URINALYSIS, ROUTINE W REFLEX MICROSCOPIC
Bilirubin Urine: NEGATIVE
Glucose, UA: NEGATIVE mg/dL
Hgb urine dipstick: NEGATIVE
Ketones, ur: NEGATIVE mg/dL
Leukocytes, UA: NEGATIVE
Nitrite: NEGATIVE
Protein, ur: NEGATIVE mg/dL
Specific Gravity, Urine: 1.011 (ref 1.005–1.030)
pH: 5 (ref 5.0–8.0)

## 2016-08-08 LAB — I-STAT CHEM 8, ED
BUN: 32 mg/dL — AB (ref 6–20)
CHLORIDE: 103 mmol/L (ref 101–111)
CREATININE: 1.8 mg/dL — AB (ref 0.61–1.24)
Calcium, Ion: 1.05 mmol/L — ABNORMAL LOW (ref 1.15–1.40)
Glucose, Bld: 105 mg/dL — ABNORMAL HIGH (ref 65–99)
HEMATOCRIT: 42 % (ref 39.0–52.0)
Hemoglobin: 14.3 g/dL (ref 13.0–17.0)
Potassium: 3.7 mmol/L (ref 3.5–5.1)
SODIUM: 136 mmol/L (ref 135–145)
TCO2: 19 mmol/L (ref 0–100)

## 2016-08-08 LAB — CBG MONITORING, ED: Glucose-Capillary: 100 mg/dL — ABNORMAL HIGH (ref 65–99)

## 2016-08-08 LAB — GLUCOSE, CAPILLARY: GLUCOSE-CAPILLARY: 84 mg/dL (ref 65–99)

## 2016-08-08 LAB — I-STAT CG4 LACTIC ACID, ED
Lactic Acid, Venous: 1.77 mmol/L (ref 0.5–1.9)
Lactic Acid, Venous: 1.32 mmol/L (ref 0.5–1.9)

## 2016-08-08 LAB — COMPREHENSIVE METABOLIC PANEL WITH GFR
ALT: 40 U/L (ref 17–63)
AST: 50 U/L — ABNORMAL HIGH (ref 15–41)
Albumin: 2.4 g/dL — ABNORMAL LOW (ref 3.5–5.0)
Alkaline Phosphatase: 87 U/L (ref 38–126)
Anion gap: 11 (ref 5–15)
BUN: 33 mg/dL — ABNORMAL HIGH (ref 6–20)
CO2: 20 mmol/L — ABNORMAL LOW (ref 22–32)
Calcium: 8.5 mg/dL — ABNORMAL LOW (ref 8.9–10.3)
Chloride: 102 mmol/L (ref 101–111)
Creatinine, Ser: 1.79 mg/dL — ABNORMAL HIGH (ref 0.61–1.24)
GFR calc Af Amer: 45 mL/min — ABNORMAL LOW
GFR calc non Af Amer: 39 mL/min — ABNORMAL LOW
Glucose, Bld: 107 mg/dL — ABNORMAL HIGH (ref 65–99)
Potassium: 3.6 mmol/L (ref 3.5–5.1)
Sodium: 133 mmol/L — ABNORMAL LOW (ref 135–145)
Total Bilirubin: 0.6 mg/dL (ref 0.3–1.2)
Total Protein: 8.6 g/dL — ABNORMAL HIGH (ref 6.5–8.1)

## 2016-08-08 LAB — SEDIMENTATION RATE: Sed Rate: 117 mm/hr — ABNORMAL HIGH (ref 0–16)

## 2016-08-08 LAB — LACTIC ACID, PLASMA: Lactic Acid, Venous: 1.6 mmol/L (ref 0.5–1.9)

## 2016-08-08 LAB — PROCALCITONIN: Procalcitonin: 0.21 ng/mL

## 2016-08-08 LAB — APTT: aPTT: 32 s (ref 24–36)

## 2016-08-08 LAB — PROTIME-INR
INR: 1.11
Prothrombin Time: 14.3 s (ref 11.4–15.2)

## 2016-08-08 MED ORDER — ACETAMINOPHEN 325 MG PO TABS
650.0000 mg | ORAL_TABLET | Freq: Four times a day (QID) | ORAL | Status: DC | PRN
  Administered 2016-08-10 – 2016-08-18 (×3): 650 mg via ORAL
  Filled 2016-08-08 (×3): qty 2

## 2016-08-08 MED ORDER — ONDANSETRON HCL 4 MG PO TABS: 4.0000 mg | ORAL_TABLET | Freq: Four times a day (QID) | ORAL | Status: DC | PRN

## 2016-08-08 MED ORDER — DEXTROSE 5 % IV SOLN
INTRAVENOUS | Status: AC
  Filled 2016-08-08: qty 2

## 2016-08-08 MED ORDER — PIPERACILLIN-TAZOBACTAM 3.375 G IVPB 30 MIN
3.3750 g | Freq: Once | INTRAVENOUS | Status: AC
  Administered 2016-08-08: 3.375 g via INTRAVENOUS
  Filled 2016-08-08: qty 50

## 2016-08-08 MED ORDER — INSULIN ASPART 100 UNIT/ML ~~LOC~~ SOLN
0.0000 [IU] | Freq: Three times a day (TID) | SUBCUTANEOUS | Status: DC
  Administered 2016-08-10: 3 [IU] via SUBCUTANEOUS
  Administered 2016-08-10 – 2016-08-15 (×3): 2 [IU] via SUBCUTANEOUS

## 2016-08-08 MED ORDER — INSULIN GLARGINE 100 UNIT/ML ~~LOC~~ SOLN
25.0000 [IU] | Freq: Every day | SUBCUTANEOUS | Status: DC
  Filled 2016-08-08 (×2): qty 0.25

## 2016-08-08 MED ORDER — VANCOMYCIN HCL IN DEXTROSE 1-5 GM/200ML-% IV SOLN
1000.0000 mg | Freq: Once | INTRAVENOUS | Status: AC
  Administered 2016-08-08: 1000 mg via INTRAVENOUS
  Filled 2016-08-08: qty 200

## 2016-08-08 MED ORDER — VANCOMYCIN HCL IN DEXTROSE 1-5 GM/200ML-% IV SOLN
1000.0000 mg | INTRAVENOUS | Status: DC
  Administered 2016-08-09 – 2016-08-13 (×3): 1000 mg via INTRAVENOUS
  Filled 2016-08-08 (×6): qty 200

## 2016-08-08 MED ORDER — SODIUM CHLORIDE 0.9 % IV BOLUS (SEPSIS)
2500.0000 mL | Freq: Once | INTRAVENOUS | Status: AC
  Administered 2016-08-08: 2500 mL via INTRAVENOUS

## 2016-08-08 MED ORDER — MORPHINE SULFATE (PF) 2 MG/ML IV SOLN
2.0000 mg | INTRAVENOUS | Status: DC | PRN
  Administered 2016-08-08 – 2016-08-12 (×6): 2 mg via INTRAVENOUS
  Filled 2016-08-08 (×6): qty 1

## 2016-08-08 MED ORDER — ACETAMINOPHEN 650 MG RE SUPP: 650.0000 mg | Freq: Four times a day (QID) | RECTAL | Status: DC | PRN

## 2016-08-08 MED ORDER — METRONIDAZOLE 500 MG PO TABS
500.0000 mg | ORAL_TABLET | Freq: Three times a day (TID) | ORAL | Status: DC
  Administered 2016-08-08 – 2016-08-09 (×3): 500 mg via ORAL
  Filled 2016-08-08 (×3): qty 1

## 2016-08-08 MED ORDER — COLCHICINE 0.6 MG PO TABS
0.6000 mg | ORAL_TABLET | Freq: Two times a day (BID) | ORAL | Status: DC
  Administered 2016-08-08 – 2016-08-19 (×21): 0.6 mg via ORAL
  Filled 2016-08-08 (×21): qty 1

## 2016-08-08 MED ORDER — CARVEDILOL 3.125 MG PO TABS
3.1250 mg | ORAL_TABLET | Freq: Two times a day (BID) | ORAL | Status: DC
Start: 2016-08-08 — End: 2016-08-19
  Administered 2016-08-08 – 2016-08-19 (×18): 3.125 mg via ORAL
  Filled 2016-08-08 (×19): qty 1

## 2016-08-08 MED ORDER — ONDANSETRON HCL 4 MG/2ML IJ SOLN: 4.0000 mg | Freq: Four times a day (QID) | INTRAMUSCULAR | Status: DC | PRN

## 2016-08-08 MED ORDER — PREGABALIN 100 MG PO CAPS
200.0000 mg | ORAL_CAPSULE | Freq: Two times a day (BID) | ORAL | Status: DC
  Administered 2016-08-08 – 2016-08-19 (×22): 200 mg via ORAL
  Filled 2016-08-08 (×6): qty 2
  Filled 2016-08-08: qty 1
  Filled 2016-08-08 (×2): qty 2
  Filled 2016-08-08 (×2): qty 1
  Filled 2016-08-08 (×3): qty 2
  Filled 2016-08-08: qty 1
  Filled 2016-08-08 (×7): qty 2

## 2016-08-08 MED ORDER — LEVOTHYROXINE SODIUM 100 MCG PO TABS
100.0000 ug | ORAL_TABLET | Freq: Every day | ORAL | Status: DC
  Administered 2016-08-09 – 2016-08-19 (×10): 100 ug via ORAL
  Filled 2016-08-08 (×11): qty 1

## 2016-08-08 MED ORDER — ENOXAPARIN SODIUM 40 MG/0.4ML ~~LOC~~ SOLN
40.0000 mg | SUBCUTANEOUS | Status: DC
  Administered 2016-08-08 – 2016-08-18 (×11): 40 mg via SUBCUTANEOUS
  Filled 2016-08-08 (×11): qty 0.4

## 2016-08-08 MED ORDER — DEXTROSE 5 % IV SOLN
2.0000 g | INTRAVENOUS | Status: DC
  Administered 2016-08-08 – 2016-08-15 (×8): 2 g via INTRAVENOUS
  Filled 2016-08-08 (×9): qty 2

## 2016-08-08 MED ORDER — LACTATED RINGERS IV SOLN
INTRAVENOUS | Status: DC
  Administered 2016-08-08 – 2016-08-11 (×7): via INTRAVENOUS

## 2016-08-08 NOTE — Telephone Encounter (Signed)
Pt instructed to go to ER.

## 2016-08-08 NOTE — H&P (Signed)
History and Physical    Todd [name] ZDG:[id number] DOB: 02-24-1956 DOA: 08/08/2016  PCP: [name], MD Consultants:  Nicoletta Dress - endocrinology Patient coming from: home - lives with mother; NOK: mother, [phone]; [phone] (cell)  Chief Complaint: leg blisters  HPI: Todd [name] is a 61 y.o. male with medical history significant of surgical hypothyroidism, HTN, HLD, and DM presenting with right foot with blisters.  He noticed this development overnight.  Both feet were swollen yesterday.  He saw Dr. Livia Snellen last week for burning in the feet and he changed medication - started Augmentin and changed Lyrica from 400 to 200 mg daily.  He was concerned about infection on the right leg.  Also has problems with the left leg, "same thing."  Has not been to a wound center.  No fever.  Feeling well otherwise.  No SOB.     ED Course: Concern for cellulitis on the right, gangrene on the left.  Concern for sepsis based on low BP which improved with IVF.  Dr. Arnoldo Morale to see in the AM.  Review of Systems: As per HPI; otherwise 10 point review of systems reviewed and negative.   Ambulatory Status:  Ambulates without assistance normally; currently unable to get around independently  Past Medical History:  Diagnosis Date  . Diabetes mellitus without complication ([place])   . Goiter   . Gout   . Hyperlipidemia   . Hypertension   . Thyroid disease     Past Surgical History:  Procedure Laterality Date  . THYROID SURGERY      Social History   Social History  . Marital status: Single    Spouse name: N/A  . Number of children: N/A  . Years of education: N/A   Occupational History  . drives a forklift    Social History Main Topics  . Smoking status: Current Every Day Smoker    Packs/day: 0.75    Years: 45.00    Types: Cigarettes    Last attempt to quit: 11/12/2014  . Smokeless tobacco: Never Used  . Alcohol use No     Comment: h/o heavy use; stopped drinking in 7/16  . Drug use:  No  . Sexual activity: Not Currently   Other Topics Concern  . Not on file   Social History Narrative  . No narrative on file    No Known Allergies  Family History  Problem Relation Age of Onset  . Heart disease Mother   . Pneumonia Father   . Diabetes Maternal Aunt   . Diabetes Maternal Uncle     Prior to Admission medications   Medication Sig Start Date End Date Taking? Authorizing Provider  amoxicillin-clavulanate (AUGMENTIN) 875-125 MG tablet Take 1 tablet by mouth 2 (two) times daily. Take all of this medication 08/03/16  Yes [name], MD  carvedilol (COREG) 3.125 MG tablet Take 1 tablet (3.125 mg total) by mouth 2 (two) times daily with a meal. 07/06/16  Yes Thurnell Lose, MD  COLCRYS 0.6 MG tablet Take 0.6 mg by mouth 2 (two) times daily. 07/29/16  Yes Historical Provider, MD  Insulin Glargine (LANTUS SOLOSTAR) 100 UNIT/ML Solostar Pen Inject 25 Units into the skin daily. 07/11/16  Yes [name], MD  levothyroxine (SYNTHROID, LEVOTHROID) 100 MCG tablet Take 1 tablet (100 mcg total) by mouth daily before breakfast. 07/07/16  Yes Thurnell Lose, MD  lisinopril (PRINIVIL,ZESTRIL) 10 MG tablet Take 1 tablet (10 mg total) by mouth daily. 07/06/16  Yes Thurnell Lose, MD  metFORMIN (GLUCOPHAGE)  500 MG tablet Take 1 tablet (500 mg total) by mouth daily with breakfast. 07/11/16  Yes [name], MD  pregabalin (LYRICA) 200 MG capsule Take 2 capsules (400 mg total) by mouth daily. Patient taking differently: Take 200 mg by mouth 2 (two) times daily.  08/03/16  Yes [name], MD  triamterene-hydrochlorothiazide (MAXZIDE-25) 37.5-25 MG tablet Take 1 tablet by mouth daily. For blood pressure and fluid 08/03/16  Yes [name], MD  acetaminophen (TYLENOL) 325 MG tablet Take 2 tablets (650 mg total) by mouth every 6 (six) hours as needed for mild pain or moderate pain. 07/06/16   Thurnell Lose, MD    Physical Exam: Vitals:   08/08/16 1845 08/08/16 1900 08/08/16 1915  08/08/16 1930  BP:  136/71 143/82 139/84  Pulse:      Resp: 18 24 24 15   Temp:      TempSrc:      SpO2:      Weight:      Height:         General:  Appears calm and comfortable and is NAD Eyes:  PERRL, EOMI, normal lids, iris ENT:  grossly normal hearing, lips & tongue, mmm Neck:  no LAD, masses or thyromegaly Cardiovascular:  RRR, no m/r/g. No LE edema.  Respiratory:  CTA bilaterally, no w/r/r. Normal respiratory effort. Abdomen:  soft, ntnd, NABS Skin:  His right LE has erythema and edema extending from the toes to the mid-calf.  There is a bullous lesion along the dorso-lateral margin.  The left LE has apparent dry gangrene extending along the dorsal mid-foot and along most of the toes with some sloughing of skin on the great and 5th toes with surrounding erythema and edema also extending up to the mid-calf.  The left 2nd and 3rd toes are macerated and appear to be joined together. Musculoskeletal:  grossly normal tone BUE/BLE, good ROM, no bony abnormality Psychiatric:  grossly normal mood and affect, speech fluent and appropriate, AOx3 Neurologic:  CN 2-12 grossly intact, moves all extremities in coordinated fashion, sensation intact  Labs on Admission: I have personally reviewed following labs and imaging studies  CBC:  Recent Labs Lab 08/03/16 1119 08/05/16 1630 08/08/16 1617 08/08/16 1626  WBC 24.6* 26.9* 22.7*  --   NEUTROABS 19.7* 20.7* 17.7*  --   HGB  --   --  12.5* 14.3  HCT 36.3* 36.1* 36.3* 42.0  MCV 83 84 84.0  --   PLT 749* 793* 710*  --    Basic Metabolic Panel:  Recent Labs Lab 08/03/16 1119 08/08/16 1617 08/08/16 1626  NA 136 133* 136  K 4.9 3.6 3.7  CL 98 102 103  CO2 15* 20*  --   GLUCOSE 86 107* 105*  BUN 30* 33* 32*  CREATININE 1.41* 1.79* 1.80*  CALCIUM 8.7 8.5*  --    GFR: Estimated Creatinine Clearance: 41.7 mL/min (by C-G formula based on SCr of 1.8 mg/dL (H)). Liver Function Tests:  Recent Labs Lab 08/03/16 1119  08/08/16 1617  AST 36 50*  ALT 25 40  ALKPHOS 122* 87  BILITOT 0.8 0.6  PROT 8.0 8.6*  ALBUMIN 3.1* 2.4*   No results for input(s): LIPASE, AMYLASE in the last 168 hours. No results for input(s): AMMONIA in the last 168 hours. Coagulation Profile: No results for input(s): INR, PROTIME in the last 168 hours. Cardiac Enzymes: No results for input(s): CKTOTAL, CKMB, CKMBINDEX, TROPONINI in the last 168 hours. BNP (last 3 results) No results for input(s):  PROBNP in the last 8760 hours. HbA1C: No results for input(s): HGBA1C in the last 72 hours. CBG:  Recent Labs Lab 08/08/16 1612  GLUCAP 100*   Lipid Profile: No results for input(s): CHOL, HDL, LDLCALC, TRIG, CHOLHDL, LDLDIRECT in the last 72 hours. Thyroid Function Tests: No results for input(s): TSH, T4TOTAL, FREET4, T3FREE, THYROIDAB in the last 72 hours. Anemia Panel: No results for input(s): VITAMINB12, FOLATE, FERRITIN, TIBC, IRON, RETICCTPCT in the last 72 hours. Urine analysis:    Component Value Date/Time   COLORURINE YELLOW 08/08/2016 1610   APPEARANCEUR CLEAR 08/08/2016 1610   APPEARANCEUR Clear 08/05/2016 1630   LABSPEC 1.011 08/08/2016 1610   PHURINE 5.0 08/08/2016 1610   GLUCOSEU NEGATIVE 08/08/2016 1610   HGBUR NEGATIVE 08/08/2016 1610   BILIRUBINUR NEGATIVE 08/08/2016 1610   BILIRUBINUR Negative 08/05/2016 1630   KETONESUR NEGATIVE 08/08/2016 1610   PROTEINUR NEGATIVE 08/08/2016 1610   NITRITE NEGATIVE 08/08/2016 1610   LEUKOCYTESUR NEGATIVE 08/08/2016 1610   LEUKOCYTESUR Negative 08/05/2016 1630    Creatinine Clearance: Estimated Creatinine Clearance: 41.7 mL/min (by C-G formula based on SCr of 1.8 mg/dL (H)).  Sepsis Labs: @LABRCNTIP (procalcitonin:4,lacticidven:4) ) Recent Results (from the past 240 hour(s))  Microscopic Examination     Status: None   Collection Time: 08/05/16  4:30 PM  Result Value Ref Range Status   WBC, UA None seen 0 - 5 /hpf Final   RBC, UA 0-2 0 - 2 /hpf Final    Epithelial Cells (non renal) None seen 0 - 10 /hpf Final   Renal Epithel, UA None seen None seen /hpf Final   Mucus, UA Present Not Estab. Final   Bacteria, UA None seen None seen/Few Final  Blood Culture (routine x 2)     Status: None (Preliminary result)   Collection Time: 08/08/16  4:31 PM  Result Value Ref Range Status   Specimen Description BLOOD RIGHT HAND  Final   Special Requests BOTTLES DRAWN AEROBIC AND ANAEROBIC [facility]  Final   Culture PENDING  Incomplete   Report Status PENDING  Incomplete  Blood Culture (routine x 2)     Status: None (Preliminary result)   Collection Time: 08/08/16  4:39 PM  Result Value Ref Range Status   Specimen Description BLOOD RIGHT HAND  Final   Special Requests BOTTLES DRAWN AEROBIC ONLY 6CC AEB  Final   Culture PENDING  Incomplete   Report Status PENDING  Incomplete     Radiological Exams on Admission: Dg Chest Portable 1 View  Result Date: 08/08/2016 CLINICAL DATA:  Left foot swelling starting last night, open blisters today EXAM: PORTABLE CHEST 1 VIEW COMPARISON:  07/03/2016 FINDINGS: Cardiomediastinal silhouette is stable. No infiltrate or pleural effusion. No pulmonary edema. Bony thorax is unremarkable. IMPRESSION: No active disease. Electronically Signed   By: Lahoma Crocker M.D.   On: 08/08/2016 17:06   Dg Foot 2 Views Left  Result Date: 08/08/2016 CLINICAL DATA:  Left foot swelling last night, open blisters today. History of diabetes, gout, hypertension. EXAM: LEFT FOOT - 2 VIEW COMPARISON:  Plain film of the left foot dated 07/03/2016. FINDINGS: Osseous alignment is stable. No acute or suspicious osseous finding. Again noted is a stable benign appearing chondroid lesion versus old bone infarct within the lower right tibia. Soft tissues about the right foot and ankle are unremarkable. IMPRESSION: No acute findings. Electronically Signed   By: Franki Cabot M.D.   On: 08/08/2016 17:10    EKG: not done  Assessment/Plan Principal Problem:  Cellulitis feet due to stasis dermatitis & edema Active Problems:   AKI (acute kidney injury) ([place])   HTN (hypertension)   Hypothyroidism   Diabetes type 2, uncontrolled ([facility])   Diabetic peripheral neuropathy ([facility])   Dry gangrene ([facility])   Cellulitis/gangrene -Patient with h/o poorly controlled DM presenting with acute vs. Subacute cellulitis R > L and apparent dry gangrene on the right foot -Currently with just a couple of days of erythema and 1 day of blistering per patient report - although it is also reasonable to imagine that this gangrene developed more indolently -No current concern for nec fasc based on x-ray -CRP and ESR pending -Markedly elevated WBC count (22.7) but stable since 2/21 -He was given Vanc and Zosyn in the ER -By the cellulitis order set, this would be considered moderate non-purulent cellulitis with MRSA risk due to recent antibiotic use and treatment is indicated with Rocephin, Metronidazole, and Vancomycin -The right foot is concerning for gangrene and needs surgical evaluation; Dr. Arnoldo Morale to see the patient tomorrow -Will admit, Med Surg -With negative X-ray, may need MRI to assess for osteomyelitis - but since there is no clear ulcer, will not order for now -Needs consult by orthopedics, likely Dr. Sharol Given, tomorrow -Diabetic foot infection order set utilized, including orders for CM, SW, DM coordinator, wound care, and nutrition consult (Albumin 2.4, prior normal) -Goal would be for glucose <150 to facilitate wound healing. -Patient should be on bed rest, non-weight bearing. -Excellent BP control is needed  -Does not have 2 SIRS criteria, only elevated WBC count, normal lactate; however, he did have borderline hypotension which may be medication-related or associated with very mild AKI -Sepsis protocol initiated in the ER but currently low suspicion for sepsis -Blood cultures pending -Will add HIV -Plt 710, stable, likely reactive  AKI -Creatinine 1.79, prior  1.4-1.5 -Will rehydrate and follow  HTN -Hypotension in the ER, responsive to IVF -Hold Lisinopril and Maxzide for now -Will continue Coreg to avoid rebound tachycardia (unless BP again drops <90)  Hypothyroidism -Prior thyroid studies with low TSH and elevated free T4 in 1/18 and so Synthroid dose was changed -Will recheck TSH and free T4 now -Continue Synthroid at current dose for now  DM -A1c was 14.3 in 7/17 -Will recheck A1c now -Continue Lantus -Hold Glucophage  DVT prophylaxis: Lovenox  Code Status: Full - confirmed with patient/family Family Communication: Mother and sister present throughout evaluation  Disposition Plan:  Home once clinically improved Consults called: Surgery  Admission status: Admit - It is my clinical opinion that admission to [facility] is reasonable and necessary because this patient will require at least 2 midnights in the hospital to treat this condition based on the medical complexity of the problems presented.  Given the aforementioned information, the predictability of an adverse outcome is felt to be significant.     Karmen Bongo MD [facility]  If 7PM-7AM, please contact night-coverage [email or web address] Password [facility]  08/08/2016, 8:24 PM

## 2016-08-08 NOTE — Progress Notes (Signed)
Pharmacy Antibiotic Note  Todd [name] is a 61 y.o. male admitted on 08/08/2016 with sepsis and diabetic foot.  Pharmacy has been consulted for Vancomycin dosing.  Patient received 1gm IV Vancomycin in ED @ 16:46 on 08/08/2016.  Plan: Recent AKI on January 2018 due to colchicine induced diarrhea, dehydration, NSAID& ACE inhibitor use, resolved with IVF. Vancomycin 1gm IV every 24 hours.  Goal trough 15-20 mcg/mL. Conservative dosing due to recent renal issues and creatinine trending up from labs on 08/03/2016.  Height: 5\' 8"  (172.7 cm) Weight: 156 lb 4.8 oz (70.9 kg) IBW/kg (Calculated) : 68.4  Temp (24hrs), Avg:98.1 F (36.7 C), Min:98 F (36.7 C), Max:98.2 F (36.8 C)   Recent Labs Lab 08/03/16 1119 08/05/16 1630 08/08/16 1617 08/08/16 1626 08/08/16 1631 08/08/16 1952  WBC 24.6* 26.9* 22.7*  --   --   --   CREATININE 1.41*  --  1.79* 1.80*  --   --   LATICACIDVEN  --   --   --   --  1.77 1.32    Estimated Creatinine Clearance: 41.7 mL/min (by C-G formula based on SCr of 1.8 mg/dL (H)).    No Known Allergies  Antimicrobials this admission: Vancomycin 2/26 >>  Rocephin 2/26 >>  Flagyl 2/26 Zosyn 3.375gm IV x 1 2/26  Dose adjustments this admission: n/a   Microbiology results: 2/26 BCx: pending  Thank you for allowing pharmacy to be a part of this patient's care.  Mavin, Dyke 08/08/2016 9:37 PM

## 2016-08-08 NOTE — ED Provider Notes (Signed)
[facility] DEPT Provider Note   CSN: [id number] Arrival date & time: 08/08/16  1555     History   Chief Complaint Chief Complaint  Patient presents with  . Leg Swelling    HPI Todd [name] is a 61 y.o. male.  Patient complains of swelling to both lower legs. He was seen by his doctor a few days ago and started on Augmentin.    Illness  This is a new problem. The current episode started more than 2 days ago. The problem occurs constantly. The problem has not changed since onset.Pertinent negatives include no chest pain, no abdominal pain and no headaches. Nothing aggravates the symptoms. Nothing relieves the symptoms. Treatments tried: Augmentin. The treatment provided no relief.    Past Medical History:  Diagnosis Date  . Diabetes mellitus without complication ([place])   . Goiter   . Gout   . Hyperlipidemia   . Hypertension   . Thyroid disease     Patient Active Problem List   Diagnosis Date Noted  . Diabetic peripheral neuropathy ([place]) 07/21/2016  . Renal failure 07/04/2016  . Gout 07/04/2016  . Elevated troponin 07/04/2016  . Cellulitis feet due to stasis dermatitis & edema 07/04/2016  . Hyperuricemia 06/21/2016  . HTN (hypertension) 01/05/2016  . Diabetes type 2, uncontrolled ([place]) 01/05/2016  . AKI (acute kidney injury) ([place]) 12/20/2015  . Hypothyroidism 05/10/2011    Past Surgical History:  Procedure Laterality Date  . THYROID SURGERY         Home Medications    Prior to Admission medications   Medication Sig Start Date End Date Taking? Authorizing Provider  amoxicillin-clavulanate (AUGMENTIN) 875-125 MG tablet Take 1 tablet by mouth 2 (two) times daily. Take all of this medication 08/03/16  Yes [name], MD  carvedilol (COREG) 3.125 MG tablet Take 1 tablet (3.125 mg total) by mouth 2 (two) times daily with a meal. 07/06/16  Yes Thurnell Lose, MD  COLCRYS 0.6 MG tablet Take 0.6 mg by mouth 2 (two) times daily. 07/29/16  Yes Historical  Provider, MD  Insulin Glargine (LANTUS SOLOSTAR) 100 UNIT/ML Solostar Pen Inject 25 Units into the skin daily. 07/11/16  Yes [name], MD  levothyroxine (SYNTHROID, LEVOTHROID) 100 MCG tablet Take 1 tablet (100 mcg total) by mouth daily before breakfast. 07/07/16  Yes Thurnell Lose, MD  lisinopril (PRINIVIL,ZESTRIL) 10 MG tablet Take 1 tablet (10 mg total) by mouth daily. 07/06/16  Yes Thurnell Lose, MD  metFORMIN (GLUCOPHAGE) 500 MG tablet Take 1 tablet (500 mg total) by mouth daily with breakfast. 07/11/16  Yes [name], MD  pregabalin (LYRICA) 200 MG capsule Take 2 capsules (400 mg total) by mouth daily. Patient taking differently: Take 200 mg by mouth 2 (two) times daily.  08/03/16  Yes [name], MD  triamterene-hydrochlorothiazide (MAXZIDE-25) 37.5-25 MG tablet Take 1 tablet by mouth daily. For blood pressure and fluid 08/03/16  Yes [name], MD  acetaminophen (TYLENOL) 325 MG tablet Take 2 tablets (650 mg total) by mouth every 6 (six) hours as needed for mild pain or moderate pain. 07/06/16   Thurnell Lose, MD    Family History Family History  Problem Relation Age of Onset  . Heart disease Mother   . Pneumonia Father   . Diabetes Maternal Aunt   . Diabetes Maternal Uncle     Social History Social History  Substance Use Topics  . Smoking status: Current Every Day Smoker    Packs/day: 0.75    Years: 45.00  Types: Cigarettes    Last attempt to quit: 11/12/2014  . Smokeless tobacco: Never Used  . Alcohol use No     Comment: h/o heavy use; stopped drinking in 7/16     Allergies   Patient has no known allergies.   Review of Systems Review of Systems  Constitutional: Negative for appetite change and fatigue.  HENT: Negative for congestion, ear discharge and sinus pressure.   Eyes: Negative for discharge.  Respiratory: Negative for cough.   Cardiovascular: Negative for chest pain.  Gastrointestinal: Negative for abdominal pain and diarrhea.    Genitourinary: Negative for frequency and hematuria.  Musculoskeletal: Negative for back pain.       Swelling to both lower legs  Skin: Negative for rash.  Neurological: Negative for seizures and headaches.  Psychiatric/Behavioral: Negative for hallucinations.     Physical Exam Updated Vital Signs BP 139/86   Pulse 79   Temp 98.2 F (36.8 C) (Oral)   Resp 16   Ht 5\' 8"  (1.727 m)   Wt 170 lb (77.1 kg)   SpO2 100%   BMI 25.85 kg/m   Physical Exam  Constitutional: He is oriented to person, place, and time. He appears well-developed.  HENT:  Head: Normocephalic.  Eyes: Conjunctivae and EOM are normal. No scleral icterus.  Neck: Neck supple. No thyromegaly present.  Cardiovascular: Normal rate and regular rhythm.  Exam reveals no gallop and no friction rub.   No murmur heard. Pulmonary/Chest: No stridor. He has no wheezes. He has no rales. He exhibits no tenderness.  Abdominal: He exhibits no distension. There is no tenderness. There is no rebound.  Musculoskeletal: Normal range of motion. He exhibits no edema.  Patient has swelling and tenderness to both feet. Right foot appears to have a cellulitis.   The left foot smells and looks like gangrene to the distal half of his foot.  Lymphadenopathy:    He has no cervical adenopathy.  Neurological: He is oriented to person, place, and time. He exhibits normal muscle tone. Coordination normal.  Skin: No rash noted. No erythema.  Psychiatric: He has a normal mood and affect. His behavior is normal.     ED Treatments / Results  Labs (all labs ordered are listed, but only abnormal results are displayed) Labs Reviewed  COMPREHENSIVE METABOLIC PANEL - Abnormal; Notable for the following:       Result Value   Sodium 133 (*)    CO2 20 (*)    Glucose, Bld 107 (*)    BUN 33 (*)    Creatinine, Ser 1.79 (*)    Calcium 8.5 (*)    Total Protein 8.6 (*)    Albumin 2.4 (*)    AST 50 (*)    GFR calc non Af Amer 39 (*)    GFR calc Af  Amer 45 (*)    All other components within normal limits  CBC WITH DIFFERENTIAL/PLATELET - Abnormal; Notable for the following:    WBC 22.7 (*)    Hemoglobin 12.5 (*)    HCT 36.3 (*)    Platelets 710 (*)    Neutro Abs 17.7 (*)    Monocytes Absolute 1.1 (*)    All other components within normal limits  I-STAT CHEM 8, ED - Abnormal; Notable for the following:    BUN 32 (*)    Creatinine, Ser 1.80 (*)    Glucose, Bld 105 (*)    Calcium, Ion 1.05 (*)    All other components within normal limits  CBG MONITORING, ED - Abnormal; Notable for the following:    Glucose-Capillary 100 (*)    All other components within normal limits  CULTURE, BLOOD (ROUTINE X 2)  CULTURE, BLOOD (ROUTINE X 2)  URINALYSIS, ROUTINE W REFLEX MICROSCOPIC  I-STAT CG4 LACTIC ACID, ED  I-STAT CG4 LACTIC ACID, ED    EKG  EKG Interpretation None       Radiology Dg Chest Portable 1 View  Result Date: 08/08/2016 CLINICAL DATA:  Left foot swelling starting last night, open blisters today EXAM: PORTABLE CHEST 1 VIEW COMPARISON:  07/03/2016 FINDINGS: Cardiomediastinal silhouette is stable. No infiltrate or pleural effusion. No pulmonary edema. Bony thorax is unremarkable. IMPRESSION: No active disease. Electronically Signed   By: Lahoma Crocker M.D.   On: 08/08/2016 17:06   Dg Foot 2 Views Left  Result Date: 08/08/2016 CLINICAL DATA:  Left foot swelling last night, open blisters today. History of diabetes, gout, hypertension. EXAM: LEFT FOOT - 2 VIEW COMPARISON:  Plain film of the left foot dated 07/03/2016. FINDINGS: Osseous alignment is stable. No acute or suspicious osseous finding. Again noted is a stable benign appearing chondroid lesion versus old bone infarct within the lower right tibia. Soft tissues about the right foot and ankle are unremarkable. IMPRESSION: No acute findings. Electronically Signed   By: Franki Cabot M.D.   On: 08/08/2016 17:10    Procedures Procedures (including critical care  time)  Medications Ordered in ED Medications  sodium chloride 0.9 % bolus 2,500 mL (2,500 mLs Intravenous New Bag/Given 08/08/16 1619)  vancomycin (VANCOCIN) IVPB 1000 mg/200 mL premix (0 mg Intravenous Stopped 08/08/16 1801)  piperacillin-tazobactam (ZOSYN) IVPB 3.375 g (0 g Intravenous Stopped 08/08/16 1720)     Initial Impression / Assessment and Plan / ED Course  I have reviewed the triage vital signs and the nursing notes.  Pertinent labs & imaging results that were available during my care of the patient were reviewed by me and considered in my medical decision making (see chart for details).   CRITICAL CARE Performed by: Malaquias Lenker L Total critical care time40 minutes Critical care time was exclusive of separately billable procedures and treating other patients. Critical care was necessary to treat or prevent imminent or life-threatening deterioration. Critical care was time spent personally by me on the following activities: development of treatment plan with patient and/or surrogate as well as nursing, discussions with consultants, evaluation of patient's response to treatment, examination of patient, obtaining history from patient or surrogate, ordering and performing treatments and interventions, ordering and review of laboratory studies, ordering and review of radiographic studies, pulse oximetry and re-evaluation of patient's condition.  Patient with cellulitis and gangrenous-looking left foot. Patient was started on antibiotics septic workup done he is going to be admitted to medicine and general surgery Dr. Arnoldo Morale will consult in the a.m.  Final Clinical Impressions(s) / ED Diagnoses   Final diagnoses:  Left leg swelling    New Prescriptions New Prescriptions   No medications on file     Milton Ferguson, MD 08/08/16 [phone]

## 2016-08-08 NOTE — Telephone Encounter (Signed)
Notified of recommendation Verbalizes understanding 

## 2016-08-08 NOTE — Telephone Encounter (Signed)
Incoming call from [employer] sister stating pt has gotten significantly worse over the weekend Pt has had multiple falls over the weekend Family would like [facility] to come in Pt also had blister on L foot that ruptured Would like topical RX for this Please review and advise

## 2016-08-08 NOTE — Telephone Encounter (Signed)
Todd [name] wants something called in to put on his open sores on his feet.  Please call and let them know what to do.  Horticulturist, commercial.

## 2016-08-08 NOTE — Telephone Encounter (Signed)
Refer to home health but if he is getting worse then I recommend going to the emergency room.

## 2016-08-08 NOTE — ED Triage Notes (Signed)
Pt c/o feet swelling getting worse over past week. ble swelling noted in triage. No sob/distress noted. A/o. Pt denies any sob/cough.

## 2016-08-09 ENCOUNTER — Inpatient Hospital Stay (HOSPITAL_COMMUNITY): Payer: BLUE CROSS/BLUE SHIELD

## 2016-08-09 DIAGNOSIS — L03032 Cellulitis of left toe: Secondary | ICD-10-CM

## 2016-08-09 DIAGNOSIS — I96 Gangrene, not elsewhere classified: Secondary | ICD-10-CM

## 2016-08-09 LAB — GLUCOSE, CAPILLARY
GLUCOSE-CAPILLARY: 47 mg/dL — AB (ref 65–99)
GLUCOSE-CAPILLARY: 61 mg/dL — AB (ref 65–99)
GLUCOSE-CAPILLARY: 136 mg/dL — AB (ref 65–99)
GLUCOSE-CAPILLARY: 79 mg/dL (ref 65–99)
Glucose-Capillary: 66 mg/dL (ref 65–99)
Glucose-Capillary: 122 mg/dL — ABNORMAL HIGH (ref 65–99)
Glucose-Capillary: 84 mg/dL (ref 65–99)

## 2016-08-09 LAB — BASIC METABOLIC PANEL
Anion gap: 7 (ref 5–15)
BUN: 25 mg/dL — AB (ref 6–20)
CO2: 17 mmol/L — ABNORMAL LOW (ref 22–32)
CREATININE: 1.26 mg/dL — AB (ref 0.61–1.24)
Calcium: 7.8 mg/dL — ABNORMAL LOW (ref 8.9–10.3)
Chloride: 107 mmol/L (ref 101–111)
GFR calc Af Amer: >60 mL/min (ref >60)
GFR, EST NON AFRICAN AMERICAN: 60 mL/min — AB (ref >60)
GLUCOSE: 67 mg/dL (ref 65–99)
Potassium: 4 mmol/L (ref 3.5–5.1)
SODIUM: 131 mmol/L — AB (ref 135–145)

## 2016-08-09 LAB — PREALBUMIN: Prealbumin: 8 mg/dL — ABNORMAL LOW (ref 18–38)

## 2016-08-09 LAB — CBC
HCT: 33.6 % — ABNORMAL LOW (ref 39.0–52.0)
Hemoglobin: 11.4 g/dL — ABNORMAL LOW (ref 13.0–17.0)
MCH: 28.2 pg (ref 26.0–34.0)
MCHC: 33.9 g/dL (ref 30.0–36.0)
MCV: 83.2 fL (ref 78.0–100.0)
PLATELETS: 722 10*3/uL — AB (ref 150–400)
RBC: 4.04 MIL/uL — ABNORMAL LOW (ref 4.22–5.81)
RDW: 13.9 % (ref 11.5–15.5)
WBC: 22.9 10*3/uL — AB (ref 4.0–10.5)

## 2016-08-09 LAB — TSH: TSH: 1.726 u[IU]/mL (ref 0.350–4.500)

## 2016-08-09 LAB — T4, FREE: Free T4: 1.64 ng/dL — ABNORMAL HIGH (ref 0.61–1.12)

## 2016-08-09 LAB — LACTIC ACID, PLASMA: Lactic Acid, Venous: 1.2 mmol/L (ref 0.5–1.9)

## 2016-08-09 LAB — C-REACTIVE PROTEIN: CRP: 3.2 mg/dL — AB (ref <1.0)

## 2016-08-09 MED ORDER — GLUCERNA SHAKE PO LIQD
237.0000 mL | Freq: Two times a day (BID) | ORAL | Status: DC
  Administered 2016-08-09 – 2016-08-18 (×12): 237 mL via ORAL

## 2016-08-09 MED ORDER — INSULIN GLARGINE 100 UNIT/ML ~~LOC~~ SOLN
20.0000 [IU] | Freq: Every day | SUBCUTANEOUS | Status: DC
  Administered 2016-08-10 – 2016-08-15 (×6): 20 [IU] via SUBCUTANEOUS
  Filled 2016-08-09 (×7): qty 0.2

## 2016-08-09 NOTE — Progress Notes (Addendum)
Inpatient Diabetes Program Recommendations  AACE/ADA: New Consensus Statement on Inpatient Glycemic Control (2015)  Target Ranges:  Prepandial:   less than 140 mg/dL      Peak postprandial:   less than 180 mg/dL (1-2 hours)      Critically ill patients:  140 - 180 mg/dL   Results for KHYRIE, MASI (MRN [id number]) as of 08/09/2016 07:39  Ref. Range 08/08/2016 16:12 08/08/2016 21:14 08/09/2016 07:28  Glucose-Capillary Latest Ref Range: 65 - 99 mg/dL 100 (H) 84 47 (L)  Results for FERRIS, FIELDEN (MRN [id number]) as of 08/09/2016 07:39  Ref. Range 08/08/2016 16:17 08/08/2016 16:26 08/09/2016 05:23  Glucose Latest Ref Range: 65 - 99 mg/dL 107 (H) 105 (H) 67   Review of Glycemic Control  Diabetes history: DM2 Outpatient Diabetes medications: Lantus 25 units daily, Metformin 500 mg QAM Current orders for Inpatient glycemic control: Lantus 25 units daily, Novolog 0-15 units TID with meals  Inpatient Diabetes Program Recommendations: Insulin - Basal: Fasting glucose 47 mg/dl this morning at 7:28 am. Per chart review, patient took Lantus 25 units at home yesterday prior to coming to the hospital. Please consider decreasing Lantus to 18 units daily (based on 70.9 kg x 0.25 units). Correction (SSI): Please consider adding Novolog bedtime correction scale. HgbA1C: A1C in process. Initial A1C at time of DM dx was 14.3% on 12/21/15 and per chart review, A1C was improved to 8.7% (per note on 02/24/2016 by Cherre Robins, RPh).    Addendum 08/09/16@10 :25-Spoke with patient about diabetes and home regimen for diabetes control. Patient reports that he is followed by PCP for diabetes management and currently he takes Lantus 25 units QAM and Metformin 500 mg QAM as on outpatient for diabetes control. Patient reports that he is taking DM medications as prescribed and confirms that he took Lantus 25 units yesterday morning prior to coming to the hospital. Patient states that he checks his glucose 1 time per day and that  it is usually 90-120 mg/dl in the morning. Inquired about hypoglycemia this morning and patient reports that he did NOT have any symptoms of hypoglycemia (denies shakiness, sweating, or confusion) this morning. Discussed concern about hypoglycemia unawareness.   Discussed glucose and A1C goals. Discussed importance of checking CBGs and maintaining good CBG control to prevent long-term and short-term complications. Discussed hypoglycemia and hyperglycemia in more detail along with treatment.  Explained how hyperglycemia leads to damage within blood vessels which lead to the common complications seen with uncontrolled diabetes. Stressed to the patient the importance of glycemic control to prevent further complications from uncontrolled diabetes.  Encouraged patient to check his glucose 2-3 times per day to keep a log book of glucose readings and insulin taken which he will needs to take to doctor appointments. Explained how the doctor can use the log book to continue to make insulin adjustments if needed. Encouraged patient to call his doctor if he experiences any hypoglycemia as an outpatient for further instructions with DM medications.  Patient verbalized understanding of information discussed and he states that he has no further questions at this time related to diabetes.  Thanks, Barnie Alderman, RN, MSN, CDE Diabetes Coordinator Inpatient Diabetes Program [phone] (Team Pager from 8am to 5pm)

## 2016-08-09 NOTE — Consult Note (Signed)
Reason for Consult: Gangrene of left foot, cellulitis of right foot Referring Physician: Dr. Anjel Pardo is an 61 y.o. male.  HPI: Patient is a 61 year old white male with a history of diabetes mellitus who was referred by his primary care physician to the emergency room for worsening cellulitis of the right foot. During his examination, it was noted that his left foot was ischemic with multiple ulcerations present. X-rays of his left foot were negative for gas gangrene. Patient states that he has had problems with his feet for some time now.  They intermittently swell and blister. He is a diabetic. He does have sensation in the feet. He was admitted to the hospital for further evaluation and treatment.  Past Medical History:  Diagnosis Date  . Diabetes mellitus without complication ([place])   . Goiter   . Gout   . Hyperlipidemia   . Hypertension   . Thyroid disease     Past Surgical History:  Procedure Laterality Date  . THYROID SURGERY      Family History  Problem Relation Age of Onset  . Heart disease Mother   . Pneumonia Father   . Diabetes Maternal Aunt   . Diabetes Maternal Uncle     Social History:  reports that he has been smoking Cigarettes.  He has a 33.75 pack-year smoking history. He has never used smokeless tobacco. He reports that he does not drink alcohol or use drugs.  Allergies: No Known Allergies  Medications:  Prior to Admission:  Prescriptions Prior to Admission  Medication Sig Dispense Refill Last Dose  . amoxicillin-clavulanate (AUGMENTIN) 875-125 MG tablet Take 1 tablet by mouth 2 (two) times daily. Take all of this medication 20 tablet 0 08/08/2016 at Unknown time  . carvedilol (COREG) 3.125 MG tablet Take 1 tablet (3.125 mg total) by mouth 2 (two) times daily with a meal. 60 tablet 0 08/08/2016 at 800a  . COLCRYS 0.6 MG tablet Take 0.6 mg by mouth 2 (two) times daily.  1 08/08/2016 at Unknown time  . Insulin Glargine (LANTUS SOLOSTAR) 100  UNIT/ML Solostar Pen Inject 25 Units into the skin daily.   08/08/2016 at Unknown time  . levothyroxine (SYNTHROID, LEVOTHROID) 100 MCG tablet Take 1 tablet (100 mcg total) by mouth daily before breakfast. 30 tablet 0 08/08/2016 at Unknown time  . lisinopril (PRINIVIL,ZESTRIL) 10 MG tablet Take 1 tablet (10 mg total) by mouth daily. 30 tablet 0 08/08/2016 at Unknown time  . metFORMIN (GLUCOPHAGE) 500 MG tablet Take 1 tablet (500 mg total) by mouth daily with breakfast. 30 tablet 2 08/08/2016 at Unknown time  . pregabalin (LYRICA) 200 MG capsule Take 2 capsules (400 mg total) by mouth daily. (Patient taking differently: Take 200 mg by mouth 2 (two) times daily. ) 60 capsule 2 08/08/2016 at Unknown time  . triamterene-hydrochlorothiazide (MAXZIDE-25) 37.5-25 MG tablet Take 1 tablet by mouth daily. For blood pressure and fluid 90 tablet 3 08/08/2016 at Unknown time  . acetaminophen (TYLENOL) 325 MG tablet Take 2 tablets (650 mg total) by mouth every 6 (six) hours as needed for mild pain or moderate pain. 30 tablet 0 unknown   Scheduled: . carvedilol  3.125 mg Oral BID WC  . cefTRIAXone (ROCEPHIN)  IV  2 g Intravenous Q24H   And  . metroNIDAZOLE  500 mg Oral Q8H  . colchicine  0.6 mg Oral BID  . enoxaparin (LOVENOX) injection  40 mg Subcutaneous Q24H  . insulin aspart  0-15 Units Subcutaneous TID WC  .  insulin glargine  25 Units Subcutaneous Daily  . levothyroxine  100 mcg Oral QAC breakfast  . pregabalin  200 mg Oral BID  . vancomycin  1,000 mg Intravenous Q24H    Results for orders placed or performed during the hospital encounter of 08/08/16 (from the past 48 hour(s))  Urinalysis, Routine w reflex microscopic     Status: None   Collection Time: 08/08/16  4:10 PM  Result Value Ref Range   Color, Urine YELLOW YELLOW   APPearance CLEAR CLEAR   Specific Gravity, Urine 1.011 1.005 - 1.030   pH 5.0 5.0 - 8.0   Glucose, UA NEGATIVE NEGATIVE mg/dL   Hgb urine dipstick NEGATIVE NEGATIVE   Bilirubin  Urine NEGATIVE NEGATIVE   Ketones, ur NEGATIVE NEGATIVE mg/dL   Protein, ur NEGATIVE NEGATIVE mg/dL   Nitrite NEGATIVE NEGATIVE   Leukocytes, UA NEGATIVE NEGATIVE  CBG monitoring, ED     Status: Abnormal   Collection Time: 08/08/16  4:12 PM  Result Value Ref Range   Glucose-Capillary 100 (H) 65 - 99 mg/dL  Comprehensive metabolic panel     Status: Abnormal   Collection Time: 08/08/16  4:17 PM  Result Value Ref Range   Sodium 133 (L) 135 - 145 mmol/L   Potassium 3.6 3.5 - 5.1 mmol/L   Chloride 102 101 - 111 mmol/L   CO2 20 (L) 22 - 32 mmol/L   Glucose, Bld 107 (H) 65 - 99 mg/dL   BUN 33 (H) 6 - 20 mg/dL   Creatinine, Ser 1.79 (H) 0.61 - 1.24 mg/dL   Calcium 8.5 (L) 8.9 - 10.3 mg/dL   Total Protein 8.6 (H) 6.5 - 8.1 g/dL   Albumin 2.4 (L) 3.5 - 5.0 g/dL   AST 50 (H) 15 - 41 U/L   ALT 40 17 - 63 U/L   Alkaline Phosphatase 87 38 - 126 U/L   Total Bilirubin 0.6 0.3 - 1.2 mg/dL   GFR calc non Af Amer 39 (L) >60 mL/min   GFR calc Af Amer 45 (L) >60 mL/min    Comment: (NOTE) The eGFR has been calculated using the CKD EPI equation. This calculation has not been validated in all clinical situations. eGFR's persistently <60 mL/min signify possible Chronic Kidney Disease.    Anion gap 11 5 - 15  CBC WITH DIFFERENTIAL     Status: Abnormal   Collection Time: 08/08/16  4:17 PM  Result Value Ref Range   WBC 22.7 (H) 4.0 - 10.5 K/uL   RBC 4.32 4.22 - 5.81 MIL/uL   Hemoglobin 12.5 (L) 13.0 - 17.0 g/dL   HCT 36.3 (L) 39.0 - 52.0 %   MCV 84.0 78.0 - 100.0 fL   MCH 28.9 26.0 - 34.0 pg   MCHC 34.4 30.0 - 36.0 g/dL   RDW 14.0 11.5 - 15.5 %   Platelets 710 (H) 150 - 400 K/uL   Neutrophils Relative % 78 %   Lymphocytes Relative 17 %   Monocytes Relative 5 %   Eosinophils Relative 0 %   Basophils Relative 0 %   Neutro Abs 17.7 (H) 1.7 - 7.7 K/uL   Lymphs Abs 3.9 0.7 - 4.0 K/uL   Monocytes Absolute 1.1 (H) 0.1 - 1.0 K/uL   Eosinophils Absolute 0.0 0.0 - 0.7 K/uL   Basophils Absolute 0.0  0.0 - 0.1 K/uL   WBC Morphology ATYPICAL LYMPHOCYTES     Comment: WHITE COUNT CONFIRMED ON SMEAR  I-stat chem 8, ed     Status: Abnormal  Collection Time: 08/08/16  4:26 PM  Result Value Ref Range   Sodium 136 135 - 145 mmol/L   Potassium 3.7 3.5 - 5.1 mmol/L   Chloride 103 101 - 111 mmol/L   BUN 32 (H) 6 - 20 mg/dL   Creatinine, Ser 1.80 (H) 0.61 - 1.24 mg/dL   Glucose, Bld 105 (H) 65 - 99 mg/dL   Calcium, Ion 1.05 (L) 1.15 - 1.40 mmol/L   TCO2 19 0 - 100 mmol/L   Hemoglobin 14.3 13.0 - 17.0 g/dL   HCT 42.0 39.0 - 52.0 %  I-Stat CG4 Lactic Acid, ED  (not at  [facility])     Status: None   Collection Time: 08/08/16  4:31 PM  Result Value Ref Range   Lactic Acid, Venous 1.77 0.5 - 1.9 mmol/L  Blood Culture (routine x 2)     Status: None (Preliminary result)   Collection Time: 08/08/16  4:31 PM  Result Value Ref Range   Specimen Description BLOOD RIGHT HAND    Special Requests BOTTLES DRAWN AEROBIC AND ANAEROBIC 6CC EACH    Culture PENDING    Report Status PENDING   Blood Culture (routine x 2)     Status: None (Preliminary result)   Collection Time: 08/08/16  4:39 PM  Result Value Ref Range   Specimen Description BLOOD RIGHT HAND    Special Requests BOTTLES DRAWN AEROBIC ONLY 6CC AEB    Culture PENDING    Report Status PENDING   I-stat troponin, ED     Status: None   Collection Time: 08/08/16  7:20 PM  Result Value Ref Range   Troponin i, poc 0.01 0.00 - 0.08 ng/mL   Comment 3            Comment: Due to the release kinetics of cTnI, a negative result within the first hours of the onset of symptoms does not rule out myocardial infarction with certainty. If myocardial infarction is still suspected, repeat the test at appropriate intervals.   I-Stat CG4 Lactic Acid, ED  (not at  [facility])     Status: None   Collection Time: 08/08/16  7:52 PM  Result Value Ref Range   Lactic Acid, Venous 1.32 0.5 - 1.9 mmol/L  Sedimentation rate     Status: Abnormal   Collection Time: 08/08/16   8:57 PM  Result Value Ref Range   Sed Rate 117 (H) 0 - 16 mm/hr  Lactic acid, plasma     Status: None   Collection Time: 08/08/16  8:57 PM  Result Value Ref Range   Lactic Acid, Venous 1.6 0.5 - 1.9 mmol/L  Procalcitonin     Status: None   Collection Time: 08/08/16  8:57 PM  Result Value Ref Range   Procalcitonin 0.21 ng/mL    Comment:        Interpretation: PCT (Procalcitonin) <= 0.5 ng/mL: Systemic infection (sepsis) is not likely. Local bacterial infection is possible. (NOTE)         ICU PCT Algorithm               Non ICU PCT Algorithm    ----------------------------     ------------------------------         PCT < 0.25 ng/mL                 PCT < 0.1 ng/mL     Stopping of antibiotics            Stopping of antibiotics  strongly encouraged.               strongly encouraged.    ----------------------------     ------------------------------       PCT level decrease by               PCT < 0.25 ng/mL       >= 80% from peak PCT       OR PCT 0.25 - 0.5 ng/mL          Stopping of antibiotics                                             encouraged.     Stopping of antibiotics           encouraged.    ----------------------------     ------------------------------       PCT level decrease by              PCT >= 0.25 ng/mL       < 80% from peak PCT        AND PCT >= 0.5 ng/mL            Continuin g antibiotics                                              encouraged.       Continuing antibiotics            encouraged.    ----------------------------     ------------------------------     PCT level increase compared          PCT > 0.5 ng/mL         with peak PCT AND          PCT >= 0.5 ng/mL             Escalation of antibiotics                                          strongly encouraged.      Escalation of antibiotics        strongly encouraged.   Protime-INR     Status: None   Collection Time: 08/08/16  8:57 PM  Result Value Ref Range   Prothrombin Time 14.3 11.4 -  15.2 seconds   INR 1.11   APTT     Status: None   Collection Time: 08/08/16  8:57 PM  Result Value Ref Range   aPTT 32 24 - 36 seconds  Glucose, capillary     Status: None   Collection Time: 08/08/16  9:14 PM  Result Value Ref Range   Glucose-Capillary 84 65 - 99 mg/dL   Comment 1 Notify RN    Comment 2 Document in Chart   Lactic acid, plasma     Status: None   Collection Time: 08/09/16 12:04 AM  Result Value Ref Range   Lactic Acid, Venous 1.2 0.5 - 1.9 mmol/L  Basic metabolic panel     Status: Abnormal   Collection Time: 08/09/16  5:23 AM  Result Value Ref Range   Sodium 131 (L) 135 - 145 mmol/L   Potassium  4.0 3.5 - 5.1 mmol/L   Chloride 107 101 - 111 mmol/L   CO2 17 (L) 22 - 32 mmol/L   Glucose, Bld 67 65 - 99 mg/dL   BUN 25 (H) 6 - 20 mg/dL   Creatinine, Ser 1.26 (H) 0.61 - 1.24 mg/dL   Calcium 7.8 (L) 8.9 - 10.3 mg/dL   GFR calc non Af Amer 60 (L) >60 mL/min   GFR calc Af Amer >60 >60 mL/min    Comment: (NOTE) The eGFR has been calculated using the CKD EPI equation. This calculation has not been validated in all clinical situations. eGFR's persistently <60 mL/min signify possible Chronic Kidney Disease.    Anion gap 7 5 - 15  CBC     Status: Abnormal   Collection Time: 08/09/16  5:23 AM  Result Value Ref Range   WBC 22.9 (H) 4.0 - 10.5 K/uL   RBC 4.04 (L) 4.22 - 5.81 MIL/uL   Hemoglobin 11.4 (L) 13.0 - 17.0 g/dL    Comment: DELTA CHECK NOTED   HCT 33.6 (L) 39.0 - 52.0 %   MCV 83.2 78.0 - 100.0 fL   MCH 28.2 26.0 - 34.0 pg   MCHC 33.9 30.0 - 36.0 g/dL   RDW 13.9 11.5 - 15.5 %   Platelets 722 (H) 150 - 400 K/uL  Glucose, capillary     Status: Abnormal   Collection Time: 08/09/16  7:28 AM  Result Value Ref Range   Glucose-Capillary 47 (L) 65 - 99 mg/dL  Glucose, capillary     Status: None   Collection Time: 08/09/16  8:13 AM  Result Value Ref Range   Glucose-Capillary 66 65 - 99 mg/dL    Dg Chest Portable 1 View  Result Date: 08/08/2016 CLINICAL DATA:   Left foot swelling starting last night, open blisters today EXAM: PORTABLE CHEST 1 VIEW COMPARISON:  07/03/2016 FINDINGS: Cardiomediastinal silhouette is stable. No infiltrate or pleural effusion. No pulmonary edema. Bony thorax is unremarkable. IMPRESSION: No active disease. Electronically Signed   By: Lahoma Crocker M.D.   On: 08/08/2016 17:06   Dg Foot 2 Views Left  Result Date: 08/08/2016 CLINICAL DATA:  Left foot swelling last night, open blisters today. History of diabetes, gout, hypertension. EXAM: LEFT FOOT - 2 VIEW COMPARISON:  Plain film of the left foot dated 07/03/2016. FINDINGS: Osseous alignment is stable. No acute or suspicious osseous finding. Again noted is a stable benign appearing chondroid lesion versus old bone infarct within the lower right tibia. Soft tissues about the right foot and ankle are unremarkable. IMPRESSION: No acute findings. Electronically Signed   By: Franki Cabot M.D.   On: 08/08/2016 17:10    ROS:  Pertinent items noted in HPI and remainder of comprehensive ROS otherwise negative.  Blood pressure 97/66, pulse 81, temperature 98.3 F (36.8 C), temperature source Oral, resp. rate 18, height _0  (1.727 m), weight 70.9 kg (156 lb 4.8 oz), SpO2 97 %. Physical Exam: Pleasant black male in no acute distress. Head is normocephalic, atraumatic Neck is supple without JVD. Lungs clear auscultation with equal breath sounds bilaterally. Heart examination reveals a regular rate and rhythm without S3, S4, murmurs. Extremity examination reveals bilateral femoral pulses. Left foot shows ischemic changes extending to just above the ankle. The left foot is swollen with multiple superficial bulla present. The swelling extends up to the distal third of the pretibial region. He does have sensation of the foot. I could not palpate a dorsalis pedis or posterior tibial pulse.  Right foot has some erythema and it bulla that is collapsed present along the dorsum of the foot. No frank  ulcerations are seen. X-ray results reviewed. Assessment/Plan: Impression: Ischemic changes in the left foot extending to just above the ankle.  Most likely peripheral vascular disease and complications from diabetes mellitus. Right foot cellulitis is present but not as severe. Plan: We'll get segmental arterial Doppler studies of both legs today. I did tell the patient that he is at high risk for needing a left below the knee amputation. He understands. Further management pending those results. May stop Flagyl. Continue Rocephin and vancomycin.  Aviva Signs 08/09/2016, 8:20 AM

## 2016-08-09 NOTE — Consult Note (Signed)
[facility] Nurse wound consult note Awaiting surgical decision after doppler studies.  Note indicates likely left BKA, pending results.   Cellulitis to right foot, treating with Vancomycin.  Will await surgical decision and remain available as needed.  [facility] team will follow and remain available to patient, medical and nursing teams.  Domenic Moras RN BSN [place] Pager [phone]

## 2016-08-09 NOTE — Progress Notes (Signed)
Hypoglycemic Event  CBG: 47 at 0728  Treatment: 15 GM carbohydrate snack  Symptoms: None  Follow-up CBG: Time:0813 CBG Result:66  Possible Reasons for Event: Unknown  Comments/MD notified:Repeated CBG after breakast at 0928, was 122.  Notified Dr Jerilee Hoh via She[facility].     Lynnda Shields

## 2016-08-09 NOTE — Progress Notes (Signed)
PROGRESS NOTE    Todd [name]  IRW:[id number] DOB: 1955/08/22 DOA: 08/08/2016 PCP: [name], MD     Brief Narrative:  61 year old man admitted to the hospital from home on 2/26 due to "leg blisters". Thought to have gangrene of left leg and possible cellulitis of the right lower extremity. Admission requested for further evaluation and management.   Assessment & Plan:   Principal Problem:   Cellulitis feet due to stasis dermatitis & edema Active Problems:   AKI (acute kidney injury) (HCC)   HTN (hypertension)   Hypothyroidism   Diabetes type 2, uncontrolled (HCC)   Diabetic peripheral neuropathy (HCC)   Dry gangrene (HCC)   Left lower extremity ischemic changes in the left foot extending to just above the ankle. -Likely has peripheral vascular disease in the setting of complications from long-standing diabetes. -He also has some cellulitis of the right leg but it is much milder. -Dr. Arnoldo Morale has seen and has requested arterial Dopplers. May need left BKA pending results. -Continue vancomycin/Rocephin as per order set. We'll discontinue Flagyl at this time.  Type 2 diabetes -With hypoglycemia this a.m. -We'll decrease Lantus from 25-20 units and continue to follow.   Acute renal failure -Improved with IV fluids, recheck renal function in a.m.  Hypothyroidism -Continue Synthroid      DVT prophylaxis: lovenox Code Status: full code Family Communication: mother at bedside updated on plan of care and all questions answered Disposition Plan: pending arterial dopplers and decision on amputation  Consultants:   Surgery, Dr. Arnoldo Morale  Procedures:   None  Antimicrobials:  Anti-infectives    Start     Dose/Rate Route Frequency Ordered Stop   08/09/16 1600  vancomycin (VANCOCIN) IVPB 1000 mg/200 mL premix     1,000 mg 200 mL/hr over 60 Minutes Intravenous Every 24 hours 08/08/16 2153     08/08/16 2200  cefTRIAXone (ROCEPHIN) 2 g in dextrose 5 % 50 mL IVPB       2 g 100 mL/hr over 30 Minutes Intravenous Every 24 hours 08/08/16 2056     08/08/16 2200  metroNIDAZOLE (FLAGYL) tablet 500 mg  Status:  Discontinued     500 mg Oral Every 8 hours 08/08/16 2056 08/09/16 1554   08/08/16 1615  vancomycin (VANCOCIN) IVPB 1000 mg/200 mL premix     1,000 mg 200 mL/hr over 60 Minutes Intravenous  Once 08/08/16 1610 08/08/16 1801   08/08/16 1615  piperacillin-tazobactam (ZOSYN) IVPB 3.375 g     3.375 g 100 mL/hr over 30 Minutes Intravenous  Once 08/08/16 1610 08/08/16 1720       Subjective: Mild pain to his legs, otherwise no complaints  Objective: Vitals:   08/08/16 1930 08/08/16 2052 08/09/16 0457 08/09/16 1420  BP: 139/84 128/68 97/66 (!) 108/55  Pulse:  75 81 81  Resp: 15 18 18 18   Temp:  98 F (36.7 C) 98.3 F (36.8 C) 99.4 F (37.4 C)  TempSrc:  Oral Oral Oral  SpO2:  100% 97% 100%  Weight:  70.9 kg (156 lb 4.8 oz)    Height:  5\' 8"  (1.727 m)      Intake/Output Summary (Last 24 hours) at 08/09/16 1555 Last data filed at 08/09/16 1139  Gross per 24 hour  Intake             2750 ml  Output             1350 ml  Net             1400  ml   Filed Weights   08/08/16 1600 08/08/16 2052  Weight: 77.1 kg (170 lb) 70.9 kg (156 lb 4.8 oz)    Examination:  General exam: Alert, awake, oriented x 3 Respiratory system: Clear to auscultation. Respiratory effort normal. Cardiovascular system:RRR. No murmurs, rubs, gallops. Gastrointestinal system: Abdomen is nondistended, soft and nontender. No organomegaly or masses felt. Normal bowel sounds heard. Central nervous system: Alert and oriented. No focal neurological deficits. Extremities:        Psychiatry: Judgement and insight appear normal. Mood & affect appropriate.     Data Reviewed: I have personally reviewed following labs and imaging studies  CBC:  Recent Labs Lab 08/03/16 1119 08/05/16 1630 08/08/16 1617 08/08/16 1626 08/09/16 0523  WBC 24.6* 26.9* 22.7*  --  22.9*   NEUTROABS 19.7* 20.7* 17.7*  --   --   HGB  --   --  12.5* 14.3 11.4*  HCT 36.3* 36.1* 36.3* 42.0 33.6*  MCV 83 84 84.0  --  83.2  PLT 749* 793* 710*  --  935*   Basic Metabolic Panel:  Recent Labs Lab 08/03/16 1119 08/08/16 1617 08/08/16 1626 08/09/16 0523  NA 136 133* 136 131*  K 4.9 3.6 3.7 4.0  CL 98 102 103 107  CO2 15* 20*  --  17*  GLUCOSE 86 107* 105* 67  BUN 30* 33* 32* 25*  CREATININE 1.41* 1.79* 1.80* 1.26*  CALCIUM 8.7 8.5*  --  7.8*   GFR: Estimated Creatinine Clearance: 59.6 mL/min (by C-G formula based on SCr of 1.26 mg/dL (H)). Liver Function Tests:  Recent Labs Lab 08/03/16 1119 08/08/16 1617  AST 36 50*  ALT 25 40  ALKPHOS 122* 87  BILITOT 0.8 0.6  PROT 8.0 8.6*  ALBUMIN 3.1* 2.4*   No results for input(s): LIPASE, AMYLASE in the last 168 hours. No results for input(s): AMMONIA in the last 168 hours. Coagulation Profile:  Recent Labs Lab 08/08/16 2057  INR 1.11   Cardiac Enzymes: No results for input(s): CKTOTAL, CKMB, CKMBINDEX, TROPONINI in the last 168 hours. BNP (last 3 results) No results for input(s): PROBNP in the last 8760 hours. HbA1C: No results for input(s): HGBA1C in the last 72 hours. CBG:  Recent Labs Lab 08/08/16 2114 08/09/16 0728 08/09/16 0813 08/09/16 0928 08/09/16 1118  GLUCAP 84 47* 66 122* 84   Lipid Profile: No results for input(s): CHOL, HDL, LDLCALC, TRIG, CHOLHDL, LDLDIRECT in the last 72 hours. Thyroid Function Tests:  Recent Labs  08/08/16 2104  TSH 1.726  FREET4 1.64*   Anemia Panel: No results for input(s): VITAMINB12, FOLATE, FERRITIN, TIBC, IRON, RETICCTPCT in the last 72 hours. Urine analysis:    Component Value Date/Time   COLORURINE YELLOW 08/08/2016 1610   APPEARANCEUR CLEAR 08/08/2016 1610   APPEARANCEUR Clear 08/05/2016 1630   LABSPEC 1.011 08/08/2016 1610   PHURINE 5.0 08/08/2016 1610   GLUCOSEU NEGATIVE 08/08/2016 1610   HGBUR NEGATIVE 08/08/2016 1610   BILIRUBINUR NEGATIVE  08/08/2016 1610   BILIRUBINUR Negative 08/05/2016 1630   KETONESUR NEGATIVE 08/08/2016 1610   PROTEINUR NEGATIVE 08/08/2016 1610   NITRITE NEGATIVE 08/08/2016 1610   LEUKOCYTESUR NEGATIVE 08/08/2016 1610   LEUKOCYTESUR Negative 08/05/2016 1630   Sepsis Labs: @LABRCNTIP (procalcitonin:4,lacticidven:4)  ) Recent Results (from the past 240 hour(s))  Microscopic Examination     Status: None   Collection Time: 08/05/16  4:30 PM  Result Value Ref Range Status   WBC, UA None seen 0 - 5 /hpf Final   RBC, UA  0-2 0 - 2 /hpf Final   Epithelial Cells (non renal) None seen 0 - 10 /hpf Final   Renal Epithel, UA None seen None seen /hpf Final   Mucus, UA Present Not Estab. Final   Bacteria, UA None seen None seen/Few Final  Blood Culture (routine x 2)     Status: None (Preliminary result)   Collection Time: 08/08/16  4:31 PM  Result Value Ref Range Status   Specimen Description BLOOD RIGHT HAND  Final   Special Requests BOTTLES DRAWN AEROBIC AND ANAEROBIC 6CC EACH  Final   Culture NO GROWTH < 24 HOURS  Final   Report Status PENDING  Incomplete  Blood Culture (routine x 2)     Status: None (Preliminary result)   Collection Time: 08/08/16  4:39 PM  Result Value Ref Range Status   Specimen Description BLOOD RIGHT HAND  Final   Special Requests BOTTLES DRAWN AEROBIC ONLY 6CC AEB  Final   Culture NO GROWTH < 24 HOURS  Final   Report Status PENDING  Incomplete         Radiology Studies: Dg Chest Portable 1 View  Result Date: 08/08/2016 CLINICAL DATA:  Left foot swelling starting last night, open blisters today EXAM: PORTABLE CHEST 1 VIEW COMPARISON:  07/03/2016 FINDINGS: Cardiomediastinal silhouette is stable. No infiltrate or pleural effusion. No pulmonary edema. Bony thorax is unremarkable. IMPRESSION: No active disease. Electronically Signed   By: Lahoma Crocker M.D.   On: 08/08/2016 17:06   Dg Foot 2 Views Left  Result Date: 08/08/2016 CLINICAL DATA:  Left foot swelling last night, open  blisters today. History of diabetes, gout, hypertension. EXAM: LEFT FOOT - 2 VIEW COMPARISON:  Plain film of the left foot dated 07/03/2016. FINDINGS: Osseous alignment is stable. No acute or suspicious osseous finding. Again noted is a stable benign appearing chondroid lesion versus old bone infarct within the lower right tibia. Soft tissues about the right foot and ankle are unremarkable. IMPRESSION: No acute findings. Electronically Signed   By: Franki Cabot M.D.   On: 08/08/2016 17:10        Scheduled Meds: . carvedilol  3.125 mg Oral BID WC  . cefTRIAXone (ROCEPHIN)  IV  2 g Intravenous Q24H  . colchicine  0.6 mg Oral BID  . enoxaparin (LOVENOX) injection  40 mg Subcutaneous Q24H  . feeding supplement (GLUCERNA SHAKE)  237 mL Oral BID BM  . insulin aspart  0-15 Units Subcutaneous TID WC  . [START ON 08/10/2016] insulin glargine  20 Units Subcutaneous Daily  . levothyroxine  100 mcg Oral QAC breakfast  . pregabalin  200 mg Oral BID  . vancomycin  1,000 mg Intravenous Q24H   Continuous Infusions: . lactated ringers 125 mL/hr at 08/09/16 1216     LOS: 1 day    Time spent: 25 minutes. Greater than 50% of this time was spent in direct contact with the patient coordinating care.     Lelon Frohlich, MD [facility] Pager [phone]  If 7PM-7AM, please contact night-coverage [email or web address] Password [facility] 08/09/2016, 3:55 PM

## 2016-08-09 NOTE — Progress Notes (Signed)
Initial Nutrition Assessment  DOCUMENTATION CODES:  Not applicable  INTERVENTION:  Glucerna Shake po BID, each supplement provides 220 kcal and 10 grams of protein  Food preferences  NUTRITION DIAGNOSIS:  Increased nutrient needs related to wound healing as evidenced by estimated nutritional requirements for this condition.  GOAL:  Patient will meet greater than or equal to 90% of their needs  MONITOR:  PO intake, Supplement acceptance, Labs, Skin  REASON FOR ASSESSMENT:  Consult Wound healing  ASSESSMENT:  61 y/o male PMHx HTN, HLD, DM, hypothyroidism. Presented with R foot blisters and swelling of both feet. Work up revealed concern for cellulitis of R foot and Gangrene of L foot. RD consulted for wound healing  Pt says that he has had a good appetite at home. Initially denies any problems eating. His mother, who he lives with, is at bedside and disagrees; she says "he dont eat well". Pt's mother also says he has lost weight.   Pt denies taking any vitamins, minerals or consuming any oral nutritional supplements. He says he takes his BG levels at home and they are "110-112" most often. He hesitated and started to say "more or less" when asked if he followed a diabetic diet. His mother says that he does avoid foods that contain a lot of sugar.   Pt says his UBW is 170-173 lbs. Per chart review, he has lost about 5% bw in the last month and likely >7.5% in <3 months. Pt could not could up with reason why he would have lost wt. Mother attributes it to poor PO intake.   At this time, pt says he has a good appetite, but didn't eat breakfast too well due to not liking the taste of the food.  RD took food preferences. Pt agreeable to Glucerna for extra protein.   Pt interacted very little during conversation and did not say much at all. Potentially still slightly lethargic from hypoglycemic event.   NFPE: WDL  Medications: IV ABx, insulin  Labs: Had hypoglycemic event this morning.  BGs mostly  60-85. WBC: 22.9, Albumin: 2.4, A1C pending   Recent Labs Lab 08/03/16 1119 08/08/16 1617 08/08/16 1626 08/09/16 0523  NA 136 133* 136 131*  K 4.9 3.6 3.7 4.0  CL 98 102 103 107  CO2 15* 20*  --  17*  BUN 30* 33* 32* 25*  CREATININE 1.41* 1.79* 1.80* 1.26*  CALCIUM 8.7 8.5*  --  7.8*  GLUCOSE 86 107* 105* 67   Diet Order:  Diet heart healthy/carb modified Room service appropriate? Yes; Fluid consistency: Thin  Skin: DM blisters to R/L foot  Last BM:  2/27  Height:  Ht Readings from Last 1 Encounters:  08/08/16 5\' 8"  (1.727 m)   Weight:  Wt Readings from Last 1 Encounters:  08/08/16 156 lb 4.8 oz (70.9 kg)   Wt Readings from Last 10 Encounters:  08/08/16 156 lb 4.8 oz (70.9 kg)  07/15/16 167 lb 12.8 oz (76.1 kg)  07/11/16 163 lb (73.9 kg)  07/04/16 164 lb 7.4 oz (74.6 kg)  06/21/16 181 lb (82.1 kg)  05/18/16 180 lb 3.2 oz (81.7 kg)  05/04/16 174 lb (78.9 kg)  04/20/16 174 lb (78.9 kg)  02/24/16 170 lb 12 oz (77.5 kg)  01/11/16 171 lb 3.2 oz (77.7 kg)   Ideal Body Weight:  70 kg  BMI:  Body mass index is 23.77 kg/m.  Estimated Nutritional Needs:  Kcal:  1900-2100 (27-30 kcal/kg bw) Protein:  85-100g (1.2-1.4 g/kg bw) Fluid:  >  2.1 L (30 ml/kg bw)  EDUCATION NEEDS:  No education needs identified at this time  Burtis Junes RD, LDN, [place] Clinical Nutrition Pager: [phone] 08/09/2016 12:15 PM

## 2016-08-09 NOTE — Clinical Social Work Note (Signed)
CSW received referral for medication assistance. CM notified. CSW will sign off.  Benay Pike, [place]

## 2016-08-10 ENCOUNTER — Encounter (HOSPITAL_COMMUNITY): Payer: Self-pay | Admitting: Family Medicine

## 2016-08-10 LAB — GLUCOSE, CAPILLARY
GLUCOSE-CAPILLARY: 81 mg/dL (ref 65–99)
GLUCOSE-CAPILLARY: 169 mg/dL — AB (ref 65–99)
Glucose-Capillary: 137 mg/dL — ABNORMAL HIGH (ref 65–99)
Glucose-Capillary: 115 mg/dL — ABNORMAL HIGH (ref 65–99)

## 2016-08-10 LAB — CBC
HCT: 34.5 % — ABNORMAL LOW (ref 39.0–52.0)
Hemoglobin: 11.7 g/dL — ABNORMAL LOW (ref 13.0–17.0)
MCH: 28.2 pg (ref 26.0–34.0)
MCHC: 33.9 g/dL (ref 30.0–36.0)
MCV: 83.1 fL (ref 78.0–100.0)
PLATELETS: 741 10*3/uL — AB (ref 150–400)
RBC: 4.15 MIL/uL — AB (ref 4.22–5.81)
RDW: 13.6 % (ref 11.5–15.5)
WBC: 25.1 10*3/uL — ABNORMAL HIGH (ref 4.0–10.5)

## 2016-08-10 LAB — HEMOGLOBIN A1C
HEMOGLOBIN A1C: 6.8 % — AB (ref 4.8–5.6)
MEAN PLASMA GLUCOSE: 148 mg/dL

## 2016-08-10 LAB — BASIC METABOLIC PANEL
ANION GAP: 10 (ref 5–15)
BUN: 14 mg/dL (ref 6–20)
CALCIUM: 8.2 mg/dL — AB (ref 8.9–10.3)
CO2: 19 mmol/L — ABNORMAL LOW (ref 22–32)
CREATININE: 1.03 mg/dL (ref 0.61–1.24)
Chloride: 104 mmol/L (ref 101–111)
Glucose, Bld: 115 mg/dL — ABNORMAL HIGH (ref 65–99)
Potassium: 3.8 mmol/L (ref 3.5–5.1)
Sodium: 133 mmol/L — ABNORMAL LOW (ref 135–145)

## 2016-08-10 LAB — HIV ANTIBODY (ROUTINE TESTING W REFLEX): HIV Screen 4th Generation wRfx: NONREACTIVE

## 2016-08-10 NOTE — Consult Note (Signed)
[facility] Nurse wound consult note Reason for Consult:Gangrene of left foot, cellulitis of right foot, segmental Dopplers show severe peripheral vascular disease.  Will be transferred to [facility] for surgical intervention on left lower extremity.   Wound type:vascular disease.  Left lower leg will require surgical intervention due to gangrene.  Cellulitis to right dorsal foot, ruptured blistering.  Pressure Injury POA: N/A Measurement:Right dorsal foot 6 cm x 3 cm ruptured serum filled blister.  Currently on Vancomycin  Wound VIF:[id number] blister Drainage (amount, consistency, odor) none noted.  Dry  Periwound:Darkened foot and toes.  Gangrene to left foot.  Dressing procedure/placement/frequency:Will leave open to air at this time.  Awaiting transfer to [facility] for surgical intervention.  Will not follow at this time.  Please re-consult if needed.  Domenic Moras RN BSN [place] Pager [phone]

## 2016-08-10 NOTE — Progress Notes (Addendum)
08/10/2016 1:04 PM  I spoke with patient's mother Todd [name] and updated her on his condition and pending transfer to [facility].  It is medically necessary for him to be transferred to [facility] because they offer a critically needed service there that we do not have here with vascular surgery and he also will need to work in conjunction with orthopedics so that they can coordinate his care.  I spoke with family and updated them.     Murvin Natal, MD

## 2016-08-10 NOTE — Plan of Care (Signed)
Problem: Safety: Goal: Ability to remain free from injury will improve Outcome: Not Progressing Pt gets confused at times and needs to be redirected. Pt educated on safety measurements and his bed alarm. Pt verbalized understanding.

## 2016-08-10 NOTE — Care Management Note (Signed)
Case Management Note  Patient Details  Name: Barlow Harrison MRN: [id number] Date of Birth: 18-Oct-1955  Subjective/Objective:                  Pt admitted with cellulitis of LLE. Pt is from home, lives with his mother. He is ind with ADL's at baseline. He reports having had [place] services in the past through [facility] but are not active with them currently. Pt has walker, WC and BSC at home PTA. Pt plans to return home at DC. Anticipate pt may need HH at DC.   Action/Plan: Pt transferring to [facility] for surgery. CM will cont to follow at [facility].   Expected Discharge Date:  08/11/16               Expected Discharge Plan:  [place]  In-House Referral:  NA  Discharge planning Services  CM Consult  Post Acute Care Choice:    Choice offered to:     DME Arranged:    DME Agency:     HH Arranged:    HH Agency:     Status of Service:  In process, will continue to follow  Sherald Barge, RN 08/10/2016, 10:39 AM

## 2016-08-10 NOTE — Progress Notes (Signed)
Subjective: Patient has no complaints.  Objective: Vital signs in last 24 hours: Temp:  [98.7 F (37.1 C)-99.4 F (37.4 C)] 98.8 F (37.1 C) (02/28 0600) Pulse Rate:  [81-88] 85 (02/28 0600) Resp:  [16-18] 16 (02/28 0600) BP: (108-142)/(55-80) 137/73 (02/28 0600) SpO2:  [100 %] 100 % (02/28 0600) Weight:  [153 lb 9.6 oz (69.7 kg)] 153 lb 9.6 oz (69.7 kg) (02/28 0600) Last BM Date: 08/09/16  Intake/Output from previous day: 02/27 0701 - 02/28 0700 In: 5070.4 [P.O.:960; I.V.:3810.4; IV Piggyback:300] Out: 2050 [Urine:2050] Intake/Output this shift: No intake/output data recorded.  General appearance: alert, cooperative and no distress Extremities: Left foot demarcating with ischemia proximal to the metatarsal joints. Less swelling noted, but still with erythema extending up to the ankle. Right foot not significantly changed.  Lab Results:   Recent Labs  08/09/16 0523 08/10/16 0422  WBC 22.9* 25.1*  HGB 11.4* 11.7*  HCT 33.6* 34.5*  PLT 722* 741*   BMET  Recent Labs  08/09/16 0523 08/10/16 0422  NA 131* 133*  K 4.0 3.8  CL 107 104  CO2 17* 19*  GLUCOSE 67 115*  BUN 25* 14  CREATININE 1.26* 1.03  CALCIUM 7.8* 8.2*   PT/INR  Recent Labs  08/08/16 2057  LABPROT 14.3  INR 1.11    Studies/Results: US Arterial Seg Multiple  Result Date: 08/09/2016 CLINICAL DATA:  Worsening right foot cellulitis. Left lower extremity ischemia with multiple ulcers. EXAM: NONINVASIVE PHYSIOLOGIC VASCULAR STUDY OF BILATERAL LOWER EXTREMITIES TECHNIQUE: Evaluation of both lower extremities was performed at rest, including calculation of ankle-brachial indices, multiple segmental pressure evaluation, segmental Doppler and segmental pulse volume recording. COMPARISON:  None. FINDINGS: Right ABI:  0.29 Left ABI: 0.00. Doppler signal not detected in the left dorsalis pedis artery or left posterior tibial artery. Right Lower Extremity: Monophasic waveforms throughout the right lower  extremity. Low amplitude PVR waveforms in the right lower extremity. Unable to detect signal in the right DP. Left Lower Extremity: There may be some biphasic and triphasic Doppler waveforms in the left femoral region. Monophasic waveforms in the left SFA. No significant signal in the left popliteal artery. No significant signal identified in the left posterior tibial artery or left dorsalis pedis artery. Abnormal PVR waveforms. Segmental pressures not obtained in the left upper leg. IMPRESSION: Left ABI could not be obtained. Left ankle arteries appear to be occluded. Severe occlusive arterial disease in the right lower extremity with ABI of 0.29. Electronically Signed   By: Markus Daft M.D.   On: 08/09/2016 16:59   Dg Chest Portable 1 View  Result Date: 08/08/2016 CLINICAL DATA:  Left foot swelling starting last night, open blisters today EXAM: PORTABLE CHEST 1 VIEW COMPARISON:  07/03/2016 FINDINGS: Cardiomediastinal silhouette is stable. No infiltrate or pleural effusion. No pulmonary edema. Bony thorax is unremarkable. IMPRESSION: No active disease. Electronically Signed   By: Lahoma Crocker M.D.   On: 08/08/2016 17:06   Dg Foot 2 Views Left  Result Date: 08/08/2016 CLINICAL DATA:  Left foot swelling last night, open blisters today. History of diabetes, gout, hypertension. EXAM: LEFT FOOT - 2 VIEW COMPARISON:  Plain film of the left foot dated 07/03/2016. FINDINGS: Osseous alignment is stable. No acute or suspicious osseous finding. Again noted is a stable benign appearing chondroid lesion versus old bone infarct within the lower right tibia. Soft tissues about the right foot and ankle are unremarkable. IMPRESSION: No acute findings. Electronically Signed   By: Franki Cabot M.D.   On:  08/08/2016 17:10    Anti-infectives: Anti-infectives    Start     Dose/Rate Route Frequency Ordered Stop   08/09/16 1600  vancomycin (VANCOCIN) IVPB 1000 mg/200 mL premix     1,000 mg 200 mL/hr over 60 Minutes  Intravenous Every 24 hours 08/08/16 2153     08/08/16 2200  cefTRIAXone (ROCEPHIN) 2 g in dextrose 5 % 50 mL IVPB     2 g 100 mL/hr over 30 Minutes Intravenous Every 24 hours 08/08/16 2056     08/08/16 2200  metroNIDAZOLE (FLAGYL) tablet 500 mg  Status:  Discontinued     500 mg Oral Every 8 hours 08/08/16 2056 08/09/16 1554   08/08/16 1615  vancomycin (VANCOCIN) IVPB 1000 mg/200 mL premix     1,000 mg 200 mL/hr over 60 Minutes Intravenous  Once 08/08/16 1610 08/08/16 1801   08/08/16 1615  piperacillin-tazobactam (ZOSYN) IVPB 3.375 g     3.375 g 100 mL/hr over 30 Minutes Intravenous  Once 08/08/16 1610 08/08/16 1720      Assessment/Plan: Impression: Gangrene of left foot, cellulitis of right foot, segmental Dopplers show severe peripheral vascular disease. Further workup is outside the realm of expertise at [facility].  Discussed with Dr. Wynetta Emery and patient is to be transferred to [facility] for further evaluation and treatment by vascular service as well as orthopedics service. Patient does realize he is going to lose his foot, but to what extent will be determined in the future.  LOS: 2 days    Aviva Signs 08/10/2016

## 2016-08-10 NOTE — Progress Notes (Signed)
PROGRESS NOTE   Todd [name]  TGP:[id number] DOB: 02-Oct-1955 DOA: 08/08/2016 PCP: [name], MD   Brief Narrative:  61 year old man admitted to the hospital from home on 2/26 due to "leg blisters". Thought to have gangrene of left leg and possible cellulitis of the right lower extremity. Admission requested for further evaluation and management.  Assessment & Plan:   Principal Problem:   Cellulitis feet due to stasis dermatitis & edema Active Problems:   AKI (acute kidney injury) (HCC)   HTN (hypertension)   Hypothyroidism   Diabetes type 2, uncontrolled (HCC)   Diabetic peripheral neuropathy (HCC)   Dry gangrene (HCC)  Left lower extremity ischemic changes in the left foot extending to just above the ankle. -Likely has peripheral vascular disease in the setting of complications from long-standing diabetes. -He also has some cellulitis of the right leg but it is much milder. -Dr. Arnoldo Morale has seen and has requested arterial Dopplers. I spoke with him today patient likely needs left BKA. -Continue vancomycin/Rocephin - With WBC rising, spoke with surgery he needs amputation, transfer to [facility] for vascular surgery consult and orthopedics consult.  Type 2 diabetes -With hypoglycemia this a.m. -improved with reduced lantus doses  CBG (last 3)   Recent Labs  08/09/16 1855 08/09/16 2115 08/10/16 0722  GLUCAP 136* 79 81   Acute renal failure -Improved with IV fluids, recheck renal function in a.m.  Hypothyroidism -Continue Synthroid   DVT prophylaxis: lovenox Code Status: full code Family Communication: mother at bedside updated on plan of care and all questions answered Disposition Plan: Transfer to [facility] for vascular surgery consult and orthopedics consult  Consultants:   Surgery, Dr. Arnoldo Morale  I called and requested consult at [facility] for vascular surgery and for orthopedics surgery [name]) and let them know that patient is being transferred to  [facility] today.    Procedures:   pending  Antimicrobials:  Anti-infectives    Start     Dose/Rate Route Frequency Ordered Stop   08/09/16 1600  vancomycin (VANCOCIN) IVPB 1000 mg/200 mL premix     1,000 mg 200 mL/hr over 60 Minutes Intravenous Every 24 hours 08/08/16 2153     08/08/16 2200  cefTRIAXone (ROCEPHIN) 2 g in dextrose 5 % 50 mL IVPB     2 g 100 mL/hr over 30 Minutes Intravenous Every 24 hours 08/08/16 2056     08/08/16 2200  metroNIDAZOLE (FLAGYL) tablet 500 mg  Status:  Discontinued     500 mg Oral Every 8 hours 08/08/16 2056 08/09/16 1554   08/08/16 1615  vancomycin (VANCOCIN) IVPB 1000 mg/200 mL premix     1,000 mg 200 mL/hr over 60 Minutes Intravenous  Once 08/08/16 1610 08/08/16 1801   08/08/16 1615  piperacillin-tazobactam (ZOSYN) IVPB 3.375 g     3.375 g 100 mL/hr over 30 Minutes Intravenous  Once 08/08/16 1610 08/08/16 1720     Subjective: no complaints this morning  Objective: Vitals:   08/09/16 0457 08/09/16 1420 08/09/16 2116 08/10/16 0600  BP: 97/66 (!) 108/55 (!) 142/80 137/73  Pulse: 81 81 88 85  Resp: 18 18 18 16   Temp: 98.3 F (36.8 C) 99.4 F (37.4 C) 98.7 F (37.1 C) 98.8 F (37.1 C)  TempSrc: Oral Oral Oral Oral  SpO2: 97% 100% 100% 100%  Weight:    69.7 kg (153 lb 9.6 oz)  Height:        Intake/Output Summary (Last 24 hours) at 08/10/16 0955 Last data filed at 08/10/16 [phone]  Gross per 24 hour  Intake          4590.42 ml  Output             2050 ml  Net          2540.42 ml   Filed Weights   08/08/16 1600 08/08/16 2052 08/10/16 0600  Weight: 77.1 kg (170 lb) 70.9 kg (156 lb 4.8 oz) 69.7 kg (153 lb 9.6 oz)    Examination:  General exam: Alert, awake, oriented x 3 Respiratory system: Clear to auscultation. Respiratory effort normal. Cardiovascular system:RRR. No murmurs, rubs, gallops. Gastrointestinal system: Abdomen is nondistended, soft and nontender. No organomegaly or masses felt. Normal bowel sounds heard. Central  nervous system: Alert and oriented. No focal neurological deficits. Extremities:        Psychiatry: Judgement and insight appear normal. Mood & affect appropriate.   Data Reviewed: I have personally reviewed following labs and imaging studies  CBC:  Recent Labs Lab 08/03/16 1119 08/05/16 1630 08/08/16 1617 08/08/16 1626 08/09/16 0523 08/10/16 0422  WBC 24.6* 26.9* 22.7*  --  22.9* 25.1*  NEUTROABS 19.7* 20.7* 17.7*  --   --   --   HGB  --   --  12.5* 14.3 11.4* 11.7*  HCT 36.3* 36.1* 36.3* 42.0 33.6* 34.5*  MCV 83 84 84.0  --  83.2 83.1  PLT 749* 793* 710*  --  722* 696*   Basic Metabolic Panel:  Recent Labs Lab 08/03/16 1119 08/08/16 1617 08/08/16 1626 08/09/16 0523 08/10/16 0422  NA 136 133* 136 131* 133*  K 4.9 3.6 3.7 4.0 3.8  CL 98 102 103 107 104  CO2 15* 20*  --  17* 19*  GLUCOSE 86 107* 105* 67 115*  BUN 30* 33* 32* 25* 14  CREATININE 1.41* 1.79* 1.80* 1.26* 1.03  CALCIUM 8.7 8.5*  --  7.8* 8.2*   GFR: Estimated Creatinine Clearance: 72.9 mL/min (by C-G formula based on SCr of 1.03 mg/dL). Liver Function Tests:  Recent Labs Lab 08/03/16 1119 08/08/16 1617  AST 36 50*  ALT 25 40  ALKPHOS 122* 87  BILITOT 0.8 0.6  PROT 8.0 8.6*  ALBUMIN 3.1* 2.4*   No results for input(s): LIPASE, AMYLASE in the last 168 hours. No results for input(s): AMMONIA in the last 168 hours. Coagulation Profile:  Recent Labs Lab 08/08/16 2057  INR 1.11   Cardiac Enzymes: No results for input(s): CKTOTAL, CKMB, CKMBINDEX, TROPONINI in the last 168 hours. BNP (last 3 results) No results for input(s): PROBNP in the last 8760 hours. HbA1C:  Recent Labs  08/08/16 2104  HGBA1C 6.8*   CBG:  Recent Labs Lab 08/09/16 1118 08/09/16 1626 08/09/16 1855 08/09/16 2115 08/10/16 0722  GLUCAP 84 61* 136* 79 81   Lipid Profile: No results for input(s): CHOL, HDL, LDLCALC, TRIG, CHOLHDL, LDLDIRECT in the last 72 hours. Thyroid Function Tests:  Recent Labs   08/08/16 2104  TSH 1.726  FREET4 1.64*   Anemia Panel: No results for input(s): VITAMINB12, FOLATE, FERRITIN, TIBC, IRON, RETICCTPCT in the last 72 hours. Urine analysis:    Component Value Date/Time   COLORURINE YELLOW 08/08/2016 [place] 08/08/2016 1610   APPEARANCEUR Clear 08/05/2016 1630   LABSPEC 1.011 08/08/2016 1610   PHURINE 5.0 08/08/2016 1610   GLUCOSEU NEGATIVE 08/08/2016 1610   HGBUR NEGATIVE 08/08/2016 1610   BILIRUBINUR NEGATIVE 08/08/2016 1610   BILIRUBINUR Negative 08/05/2016 [place] 08/08/2016 1610   PROTEINUR NEGATIVE 08/08/2016  [place] 08/08/2016 [place] 08/08/2016 1610   LEUKOCYTESUR Negative 08/05/2016 1630    Recent Results (from the past 240 hour(s))  Microscopic Examination     Status: None   Collection Time: 08/05/16  4:30 PM  Result Value Ref Range Status   WBC, UA None seen 0 - 5 /hpf Final   RBC, UA 0-2 0 - 2 /hpf Final   Epithelial Cells (non renal) None seen 0 - 10 /hpf Final   Renal Epithel, UA None seen None seen /hpf Final   Mucus, UA Present Not Estab. Final   Bacteria, UA None seen None seen/Few Final  Blood Culture (routine x 2)     Status: None (Preliminary result)   Collection Time: 08/08/16  4:31 PM  Result Value Ref Range Status   Specimen Description BLOOD RIGHT HAND  Final   Special Requests BOTTLES DRAWN AEROBIC AND ANAEROBIC 6CC EACH  Final   Culture NO GROWTH < 24 HOURS  Final   Report Status PENDING  Incomplete  Blood Culture (routine x 2)     Status: None (Preliminary result)   Collection Time: 08/08/16  4:39 PM  Result Value Ref Range Status   Specimen Description BLOOD RIGHT HAND  Final   Special Requests BOTTLES DRAWN AEROBIC ONLY 6CC AEB  Final   Culture NO GROWTH < 24 HOURS  Final   Report Status PENDING  Incomplete    Radiology Studies: US Arterial Seg Multiple  Result Date: 08/09/2016 CLINICAL DATA:  Worsening right foot cellulitis. Left  lower extremity ischemia with multiple ulcers. EXAM: NONINVASIVE PHYSIOLOGIC VASCULAR STUDY OF BILATERAL LOWER EXTREMITIES TECHNIQUE: Evaluation of both lower extremities was performed at rest, including calculation of ankle-brachial indices, multiple segmental pressure evaluation, segmental Doppler and segmental pulse volume recording. COMPARISON:  None. FINDINGS: Right ABI:  0.29 Left ABI: 0.00. Doppler signal not detected in the left dorsalis pedis artery or left posterior tibial artery. Right Lower Extremity: Monophasic waveforms throughout the right lower extremity. Low amplitude PVR waveforms in the right lower extremity. Unable to detect signal in the right DP. Left Lower Extremity: There may be some biphasic and triphasic Doppler waveforms in the left femoral region. Monophasic waveforms in the left SFA. No significant signal in the left popliteal artery. No significant signal identified in the left posterior tibial artery or left dorsalis pedis artery. Abnormal PVR waveforms. Segmental pressures not obtained in the left upper leg. IMPRESSION: Left ABI could not be obtained. Left ankle arteries appear to be occluded. Severe occlusive arterial disease in the right lower extremity with ABI of 0.29. Electronically Signed   By: Markus Daft M.D.   On: 08/09/2016 16:59   Dg Chest Portable 1 View  Result Date: 08/08/2016 CLINICAL DATA:  Left foot swelling starting last night, open blisters today EXAM: PORTABLE CHEST 1 VIEW COMPARISON:  07/03/2016 FINDINGS: Cardiomediastinal silhouette is stable. No infiltrate or pleural effusion. No pulmonary edema. Bony thorax is unremarkable. IMPRESSION: No active disease. Electronically Signed   By: Lahoma Crocker M.D.   On: 08/08/2016 17:06   Dg Foot 2 Views Left  Result Date: 08/08/2016 CLINICAL DATA:  Left foot swelling last night, open blisters today. History of diabetes, gout, hypertension. EXAM: LEFT FOOT - 2 VIEW COMPARISON:  Plain film of the left foot dated  07/03/2016. FINDINGS: Osseous alignment is stable. No acute or suspicious osseous finding. Again noted is a stable benign appearing chondroid lesion versus old bone infarct within the lower right  tibia. Soft tissues about the right foot and ankle are unremarkable. IMPRESSION: No acute findings. Electronically Signed   By: Franki Cabot M.D.   On: 08/08/2016 17:10   Scheduled Meds: . carvedilol  3.125 mg Oral BID WC  . cefTRIAXone (ROCEPHIN)  IV  2 g Intravenous Q24H  . colchicine  0.6 mg Oral BID  . enoxaparin (LOVENOX) injection  40 mg Subcutaneous Q24H  . feeding supplement (GLUCERNA SHAKE)  237 mL Oral BID BM  . insulin aspart  0-15 Units Subcutaneous TID WC  . insulin glargine  20 Units Subcutaneous Daily  . levothyroxine  100 mcg Oral QAC breakfast  . pregabalin  200 mg Oral BID  . vancomycin  1,000 mg Intravenous Q24H   Continuous Infusions: . lactated ringers 125 mL/hr at 08/10/16 0608     LOS: 2 days   Time spent: 30 minutes. Greater than 50% of this time was spent in direct contact with the patient coordinating care.  Irwin Brakeman, MD [facility] Pager [phone]  If 7PM-7AM, please contact night-coverage [email or web address] Password [facility] 08/10/2016, 9:55 AM

## 2016-08-10 NOTE — Progress Notes (Signed)
Report called to Remo Lipps on [address] at [facility], family made aware of room number patient is going too.

## 2016-08-11 ENCOUNTER — Encounter (HOSPITAL_COMMUNITY): Payer: BLUE CROSS/BLUE SHIELD

## 2016-08-11 ENCOUNTER — Encounter (HOSPITAL_COMMUNITY)
Admission: EM | Disposition: A | Discharge status: Skilled Nursing Facility | Payer: Self-pay | Source: Home / Self Care | Attending: Internal Medicine

## 2016-08-11 DIAGNOSIS — Z794 Long term (current) use of insulin: Secondary | ICD-10-CM

## 2016-08-11 DIAGNOSIS — M7989 Other specified soft tissue disorders: Secondary | ICD-10-CM

## 2016-08-11 DIAGNOSIS — T8149XA Infection following a procedure, other surgical site, initial encounter: Secondary | ICD-10-CM

## 2016-08-11 DIAGNOSIS — I70244 Atherosclerosis of native arteries of left leg with ulceration of heel and midfoot: Secondary | ICD-10-CM

## 2016-08-11 DIAGNOSIS — I70234 Atherosclerosis of native arteries of right leg with ulceration of heel and midfoot: Secondary | ICD-10-CM

## 2016-08-11 DIAGNOSIS — T8743 Infection of amputation stump, right lower extremity: Secondary | ICD-10-CM

## 2016-08-11 DIAGNOSIS — E1165 Type 2 diabetes mellitus with hyperglycemia: Secondary | ICD-10-CM

## 2016-08-11 DIAGNOSIS — E1152 Type 2 diabetes mellitus with diabetic peripheral angiopathy with gangrene: Secondary | ICD-10-CM

## 2016-08-11 DIAGNOSIS — E1121 Type 2 diabetes mellitus with diabetic nephropathy: Secondary | ICD-10-CM

## 2016-08-11 HISTORY — PX: LOWER EXTREMITY ANGIOGRAPHY: CATH118251

## 2016-08-11 HISTORY — PX: ABDOMINAL AORTOGRAM: CATH118222

## 2016-08-11 HISTORY — DX: Infection following a procedure, other surgical site, initial encounter: T81.49XA

## 2016-08-11 HISTORY — DX: Infection of amputation stump, right lower extremity: T87.43

## 2016-08-11 HISTORY — PX: PERIPHERAL VASCULAR BALLOON ANGIOPLASTY: CATH118281

## 2016-08-11 LAB — POCT ACTIVATED CLOTTING TIME
ACTIVATED CLOTTING TIME: 208 s
ACTIVATED CLOTTING TIME: 186 s
Activated Clotting Time: 219 seconds
Activated Clotting Time: 164 seconds

## 2016-08-11 LAB — GLUCOSE, CAPILLARY
GLUCOSE-CAPILLARY: 100 mg/dL — AB (ref 65–99)
GLUCOSE-CAPILLARY: 92 mg/dL (ref 65–99)
Glucose-Capillary: 115 mg/dL — ABNORMAL HIGH (ref 65–99)
Glucose-Capillary: 162 mg/dL — ABNORMAL HIGH (ref 65–99)

## 2016-08-11 SURGERY — LOWER EXTREMITY ANGIOGRAPHY

## 2016-08-11 MED ORDER — ASPIRIN EC 325 MG PO TBEC
325.0000 mg | DELAYED_RELEASE_TABLET | Freq: Every day | ORAL | Status: DC
  Administered 2016-08-11 – 2016-08-19 (×8): 325 mg via ORAL
  Filled 2016-08-11 (×8): qty 1

## 2016-08-11 MED ORDER — MIDAZOLAM HCL 2 MG/2ML IJ SOLN
INTRAMUSCULAR | Status: AC
  Filled 2016-08-11: qty 2

## 2016-08-11 MED ORDER — FENTANYL CITRATE (PF) 100 MCG/2ML IJ SOLN
INTRAMUSCULAR | Status: AC
  Filled 2016-08-11: qty 2

## 2016-08-11 MED ORDER — LABETALOL HCL 5 MG/ML IV SOLN
INTRAVENOUS | Status: DC | PRN
  Administered 2016-08-11: 20 mg via INTRAVENOUS

## 2016-08-11 MED ORDER — ASPIRIN 325 MG PO TABS: 325.0000 mg | ORAL_TABLET | Freq: Every day | ORAL | Status: DC

## 2016-08-11 MED ORDER — LABETALOL HCL 5 MG/ML IV SOLN
INTRAVENOUS | Status: AC
  Filled 2016-08-11: qty 4

## 2016-08-11 MED ORDER — MIDAZOLAM HCL 2 MG/2ML IJ SOLN
INTRAMUSCULAR | Status: DC | PRN
  Administered 2016-08-11 (×2): 1 mg via INTRAVENOUS

## 2016-08-11 MED ORDER — LIDOCAINE HCL (PF) 1 % IJ SOLN
INTRAMUSCULAR | Status: DC | PRN
  Administered 2016-08-11: 15 mL via SUBCUTANEOUS

## 2016-08-11 MED ORDER — HEPARIN (PORCINE) IN NACL 2-0.9 UNIT/ML-% IJ SOLN
INTRAMUSCULAR | Status: AC
  Filled 2016-08-11: qty 1000

## 2016-08-11 MED ORDER — LIDOCAINE HCL (PF) 1 % IJ SOLN
INTRAMUSCULAR | Status: AC
  Filled 2016-08-11: qty 30

## 2016-08-11 MED ORDER — HEPARIN SODIUM (PORCINE) 1000 UNIT/ML IJ SOLN
INTRAMUSCULAR | Status: AC
  Filled 2016-08-11: qty 1

## 2016-08-11 MED ORDER — FENTANYL CITRATE (PF) 100 MCG/2ML IJ SOLN
INTRAMUSCULAR | Status: DC | PRN
  Administered 2016-08-11 (×2): 50 ug via INTRAVENOUS

## 2016-08-11 MED ORDER — HEPARIN SODIUM (PORCINE) 1000 UNIT/ML IJ SOLN
INTRAMUSCULAR | Status: DC | PRN
  Administered 2016-08-11: 6000 [IU] via INTRAVENOUS

## 2016-08-11 MED ORDER — OXYCODONE-ACETAMINOPHEN 5-325 MG PO TABS
1.0000 | ORAL_TABLET | ORAL | Status: DC | PRN
  Administered 2016-08-11 – 2016-08-15 (×11): 2 via ORAL
  Administered 2016-08-18: 1 via ORAL
  Administered 2016-08-18 – 2016-08-19 (×5): 2 via ORAL
  Filled 2016-08-11 (×9): qty 2
  Filled 2016-08-11: qty 1
  Filled 2016-08-11 (×7): qty 2

## 2016-08-11 MED ORDER — SODIUM CHLORIDE 0.9 % IV SOLN
1.0000 mL/kg/h | INTRAVENOUS | Status: AC
  Administered 2016-08-11: 1 mL/kg/h via INTRAVENOUS

## 2016-08-11 MED ORDER — IODIXANOL 320 MG/ML IV SOLN
INTRAVENOUS | Status: DC | PRN
  Administered 2016-08-11: 180 mL via INTRAVENOUS

## 2016-08-11 SURGICAL SUPPLY — 20 items
BALLN IN.PACT DCB 5X150 (BALLOONS) ×4
CATH MUSTANG 4X200X135 (BALLOONS) ×1 IMPLANT
CATH OMNI FLUSH 5F 65CM (CATHETERS) ×1 IMPLANT
CATH QUICKCROSS SUPP .035X90CM (MICROCATHETER) ×1 IMPLANT
COVER PRB 48X5XTLSCP FOLD TPE (BAG) IMPLANT
COVER PROBE 5X48 (BAG) ×4
DCB IN.PACT 5X150 (BALLOONS) IMPLANT
DEVICE TORQUE .025-.038 (MISCELLANEOUS) ×1 IMPLANT
GUIDEWIRE ANGLED .035X260CM (WIRE) ×1 IMPLANT
KIT ENCORE 26 ADVANTAGE (KITS) ×1 IMPLANT
KIT PV (KITS) ×4 IMPLANT
SHEATH FLEXOR ANSEL 1 7F 45CM (SHEATH) ×1 IMPLANT
SHEATH PINNACLE 5F 10CM (SHEATH) ×1 IMPLANT
SYR MEDRAD MARK V 150ML (SYRINGE) ×4 IMPLANT
TAPE RADIOPAQUE TURBO (MISCELLANEOUS) ×1 IMPLANT
TRANSDUCER W/STOPCOCK (MISCELLANEOUS) ×4 IMPLANT
TRAY PV CATH (CUSTOM PROCEDURE TRAY) ×4 IMPLANT
WIRE BENTSON .035X145CM (WIRE) ×1 IMPLANT
WIRE HI TORQ VERSACORE J 260CM (WIRE) ×1 IMPLANT
WIRE MINI STICK MAX (SHEATH) ×1 IMPLANT

## 2016-08-11 NOTE — Progress Notes (Signed)
   Discussed risks and benefits of angiogram and possibility of needing further intervention and possible amputation. Family present and demonstrate good understanding.   Gatlin Kittell C. Donzetta Matters, MD [facility] [place] [facility] Office: [phone] Pager: [phone]

## 2016-08-11 NOTE — Consult Note (Signed)
ORTHOPAEDIC CONSULTATION  REQUESTING PHYSICIAN: Theodis Blaze, MD  Chief Complaint: Blistering gangrenous changes bilateral lower extremities left foot worse than right.  HPI: Todd [name] is a 61 y.o. male who presents with gangrene bilateral lower extremities with blistering worse on the left foot than the right foot. Patient was initially admitted to [facility] and was transferred to [facility] for evaluation and treatment.  Past Medical History:  Diagnosis Date  . Diabetes mellitus without complication ([place])   . Goiter   . Gout   . Hyperlipidemia   . Hypertension   . Thyroid disease    Past Surgical History:  Procedure Laterality Date  . THYROID SURGERY     Social History   Social History  . Marital status: Single    Spouse name: N/A  . Number of children: N/A  . Years of education: N/A   Occupational History  . drives a forklift    Social History Main Topics  . Smoking status: Current Every Day Smoker    Packs/day: 0.75    Years: 45.00    Types: Cigarettes    Last attempt to quit: 11/12/2014  . Smokeless tobacco: Never Used  . Alcohol use No     Comment: h/o heavy use; stopped drinking in 7/16  . Drug use: No  . Sexual activity: Not Currently   Other Topics Concern  . None   Social History Narrative  . None   Family History  Problem Relation Age of Onset  . Heart disease Mother   . Pneumonia Father   . Diabetes Maternal Aunt   . Diabetes Maternal Uncle    - negative except otherwise stated in the family history section No Known Allergies Prior to Admission medications   Medication Sig Start Date End Date Taking? Authorizing Provider  amoxicillin-clavulanate (AUGMENTIN) 875-125 MG tablet Take 1 tablet by mouth 2 (two) times daily. Take all of this medication 08/03/16  Yes [name], MD  carvedilol (COREG) 3.125 MG tablet Take 1 tablet (3.125 mg total) by mouth 2 (two) times daily with a meal. 07/06/16  Yes Thurnell Lose, MD  COLCRYS  0.6 MG tablet Take 0.6 mg by mouth 2 (two) times daily. 07/29/16  Yes Historical Provider, MD  Insulin Glargine (LANTUS SOLOSTAR) 100 UNIT/ML Solostar Pen Inject 25 Units into the skin daily. 07/11/16  Yes [name], MD  levothyroxine (SYNTHROID, LEVOTHROID) 100 MCG tablet Take 1 tablet (100 mcg total) by mouth daily before breakfast. 07/07/16  Yes Thurnell Lose, MD  lisinopril (PRINIVIL,ZESTRIL) 10 MG tablet Take 1 tablet (10 mg total) by mouth daily. 07/06/16  Yes Thurnell Lose, MD  metFORMIN (GLUCOPHAGE) 500 MG tablet Take 1 tablet (500 mg total) by mouth daily with breakfast. 07/11/16  Yes [name], MD  pregabalin (LYRICA) 200 MG capsule Take 2 capsules (400 mg total) by mouth daily. Patient taking differently: Take 200 mg by mouth 2 (two) times daily.  08/03/16  Yes [name], MD  triamterene-hydrochlorothiazide (MAXZIDE-25) 37.5-25 MG tablet Take 1 tablet by mouth daily. For blood pressure and fluid 08/03/16  Yes [name], MD  acetaminophen (TYLENOL) 325 MG tablet Take 2 tablets (650 mg total) by mouth every 6 (six) hours as needed for mild pain or moderate pain. 07/06/16   Thurnell Lose, MD   US Arterial Seg Multiple  Result Date: 08/09/2016 CLINICAL DATA:  Worsening right foot cellulitis. Left lower extremity ischemia with multiple ulcers. EXAM: NONINVASIVE PHYSIOLOGIC VASCULAR STUDY OF BILATERAL LOWER  EXTREMITIES TECHNIQUE: Evaluation of both lower extremities was performed at rest, including calculation of ankle-brachial indices, multiple segmental pressure evaluation, segmental Doppler and segmental pulse volume recording. COMPARISON:  None. FINDINGS: Right ABI:  0.29 Left ABI: 0.00. Doppler signal not detected in the left dorsalis pedis artery or left posterior tibial artery. Right Lower Extremity: Monophasic waveforms throughout the right lower extremity. Low amplitude PVR waveforms in the right lower extremity. Unable to detect signal in the right DP. Left Lower  Extremity: There may be some biphasic and triphasic Doppler waveforms in the left femoral region. Monophasic waveforms in the left SFA. No significant signal in the left popliteal artery. No significant signal identified in the left posterior tibial artery or left dorsalis pedis artery. Abnormal PVR waveforms. Segmental pressures not obtained in the left upper leg. IMPRESSION: Left ABI could not be obtained. Left ankle arteries appear to be occluded. Severe occlusive arterial disease in the right lower extremity with ABI of 0.29. Electronically Signed   By: Markus Daft M.D.   On: 08/09/2016 16:59   - pertinent xrays, CT, MRI studies were reviewed and independently interpreted  Positive ROS: All other systems have been reviewed and were otherwise negative with the exception of those mentioned in the HPI and as above.  Physical Exam: General: Alert, no acute distress Psychiatric: Patient is competent for consent with normal mood and affect Lymphatic: No axillary or cervical lymphadenopathy Cardiovascular: No pedal edema Respiratory: No cyanosis, no use of accessory musculature GI: No organomegaly, abdomen is soft and non-tender  Skin: Patient's skin on both feet is cold and atrophic with blistering on the left foot worse than the right foot.   Neurologic: Patient does not have protective sensation bilateral lower extremities.   MUSCULOSKELETAL:  Patient does not have a palpable pulse bilaterally. His feet are cold worse on the left than the right with black gangrenous changes to the left toes with ischemic blisters on both lower extremities.  Assessment: Assessment: Diabetic insensate neuropathy with peripheral vascular disease with gangrenous changes to the left foot worse than the right foot.  Plan: Plan: Ankle-brachial indices are ordered. Patient will need evaluation with vascular surgery for potential revascularization. I will follow.  Thank you for the consult and the opportunity to  see Todd [name], [place] [phone] 6:54 AM

## 2016-08-11 NOTE — Progress Notes (Signed)
Site area: rt groin Site Prior to Removal:  Level 0 Pressure Applied For: 20 minutes Manual:   Yes   Patient Status During Pull:  stable Post Pull Site:  Level  0  Post Pull Instructions Given:  yes Post Pull Pulses Present: dopplered Dressing Applied:  Gauze and tegaderm Bedrest begins @ 1750 Comments:

## 2016-08-11 NOTE — Progress Notes (Signed)
PROGRESS NOTE   Todd [name]  DXI:[id number] DOB: August 05, 1955 DOA: 08/08/2016   PCP: [name], MD   Brief Narrative:  61 year old man admitted to the hospital from home on 2/26 due to "leg blisters". Thought to have gangrene of left leg and possible cellulitis of the right lower extremity. Admission requested for further evaluation and management.  Assessment & Plan:   Left lower extremity ischemic changes in the left foot extending to just above the ankle, sepsis  - Likely has peripheral vascular disease in the setting of complications from long-standing diabetes. - with gangrenous changes to the left foot worse than the right foot. - Ankle-brachial indices are ordered, right 0.29, left inaudible  - appreciate vascular surgery following, follow up on recommendations   Thrombocytosis - reactive - CBC In AM  Type 2 diabetes with complications of PVD - improved with reduced lantus doses   Acute renal failure - resolved with IVF - BMP in AM  Hypothyroidism - Continue Synthroid   DVT prophylaxis: lovenox Code Status: full code Family Communication: mother at bedside updated on plan of care and all questions answered Disposition Plan: to be determined   Consultants:   Surgery  Vascular surgery   Antimicrobials:  Anti-infectives    Start     Dose/Rate Route Frequency Ordered Stop   08/09/16 1600  vancomycin (VANCOCIN) IVPB 1000 mg/200 mL premix     1,000 mg 200 mL/hr over 60 Minutes Intravenous Every 24 hours 08/08/16 2153     08/08/16 2200  cefTRIAXone (ROCEPHIN) 2 g in dextrose 5 % 50 mL IVPB     2 g 100 mL/hr over 30 Minutes Intravenous Every 24 hours 08/08/16 2056     08/08/16 2200  metroNIDAZOLE (FLAGYL) tablet 500 mg  Status:  Discontinued     500 mg Oral Every 8 hours 08/08/16 2056 08/09/16 1554   08/08/16 1615  vancomycin (VANCOCIN) IVPB 1000 mg/200 mL premix     1,000 mg 200 mL/hr over 60 Minutes Intravenous  Once 08/08/16 1610 08/08/16 1801   08/08/16 1615  piperacillin-tazobactam (ZOSYN) IVPB 3.375 g     3.375 g 100 mL/hr over 30 Minutes Intravenous  Once 08/08/16 1610 08/08/16 1720     Subjective: no complaints this morning  Objective: Vitals:   08/11/16 1735 08/11/16 1740 08/11/16 1745 08/11/16 1750  BP: (!) 144/85 124/72 (!) 139/91 (!) 156/87  Pulse: 92 92 90 91  Resp: (!) 27 (!) 23 (!) 21 (!) 22  Temp:      TempSrc:      SpO2: 97% 99% 99% 100%  Weight:      Height:        Intake/Output Summary (Last 24 hours) at 08/11/16 1813 Last data filed at 08/11/16 1600  Gross per 24 hour  Intake             2788 ml  Output             2225 ml  Net              563 ml   Filed Weights   08/08/16 1600 08/08/16 2052 08/10/16 0600  Weight: 77.1 kg (170 lb) 70.9 kg (156 lb 4.8 oz) 69.7 kg (153 lb 9.6 oz)    Examination:  General exam: Alert, awake, oriented x 3 Respiratory system: Clear to auscultation. Respiratory effort normal. Cardiovascular system:RRR. No murmurs, rubs, gallops. Gastrointestinal system: Abdomen is nondistended, soft and nontender. No organomegaly or masses felt. Normal bowel sounds heard. Psychiatry: Judgement and insight  appear normal. Mood & affect appropriate.   Data Reviewed: I have personally reviewed following labs and imaging studies  CBC:  Recent Labs Lab 08/05/16 1630 08/08/16 1617 08/08/16 1626 08/09/16 0523 08/10/16 0422  WBC 26.9* 22.7*  --  22.9* 25.1*  NEUTROABS 20.7* 17.7*  --   --   --   HGB  --  12.5* 14.3 11.4* 11.7*  HCT 36.1* 36.3* 42.0 33.6* 34.5*  MCV 84 84.0  --  83.2 83.1  PLT 793* 710*  --  722* 469*   Basic Metabolic Panel:  Recent Labs Lab 08/08/16 1617 08/08/16 1626 08/09/16 0523 08/10/16 0422  NA 133* 136 131* 133*  K 3.6 3.7 4.0 3.8  CL 102 103 107 104  CO2 20*  --  17* 19*  GLUCOSE 107* 105* 67 115*  BUN 33* 32* 25* 14  CREATININE 1.79* 1.80* 1.26* 1.03  CALCIUM 8.5*  --  7.8* 8.2*   GFR: Estimated Creatinine Clearance: 72.9 mL/min (by C-G  formula based on SCr of 1.03 mg/dL). Liver Function Tests:  Recent Labs Lab 08/08/16 1617  AST 50*  ALT 40  ALKPHOS 87  BILITOT 0.6  PROT 8.6*  ALBUMIN 2.4*   No results for input(s): LIPASE, AMYLASE in the last 168 hours. No results for input(s): AMMONIA in the last 168 hours. Coagulation Profile:  Recent Labs Lab 08/08/16 2057  INR 1.11   HbA1C:  Recent Labs  08/08/16 2104  HGBA1C 6.8*   CBG:  Recent Labs Lab 08/10/16 1612 08/10/16 2119 08/11/16 0726 08/11/16 1133 08/11/16 1659  GLUCAP 137* 115* 100* 115* 92   Thyroid Function Tests:  Recent Labs  08/08/16 2104  TSH 1.726  FREET4 1.64*   Anemia Panel: No results for input(s): VITAMINB12, FOLATE, FERRITIN, TIBC, IRON, RETICCTPCT in the last 72 hours. Urine analysis:    Component Value Date/Time   COLORURINE YELLOW 08/08/2016 1610   APPEARANCEUR CLEAR 08/08/2016 1610   APPEARANCEUR Clear 08/05/2016 1630   LABSPEC 1.011 08/08/2016 1610   PHURINE 5.0 08/08/2016 1610   GLUCOSEU NEGATIVE 08/08/2016 1610   HGBUR NEGATIVE 08/08/2016 1610   BILIRUBINUR NEGATIVE 08/08/2016 1610   BILIRUBINUR Negative 08/05/2016 1630   KETONESUR NEGATIVE 08/08/2016 1610   PROTEINUR NEGATIVE 08/08/2016 1610   NITRITE NEGATIVE 08/08/2016 1610   LEUKOCYTESUR NEGATIVE 08/08/2016 1610   LEUKOCYTESUR Negative 08/05/2016 1630    Recent Results (from the past 240 hour(s))  Microscopic Examination     Status: None   Collection Time: 08/05/16  4:30 PM  Result Value Ref Range Status   WBC, UA None seen 0 - 5 /hpf Final   RBC, UA 0-2 0 - 2 /hpf Final   Epithelial Cells (non renal) None seen 0 - 10 /hpf Final   Renal Epithel, UA None seen None seen /hpf Final   Mucus, UA Present Not Estab. Final   Bacteria, UA None seen None seen/Few Final  Blood Culture (routine x 2)     Status: None (Preliminary result)   Collection Time: 08/08/16  4:31 PM  Result Value Ref Range Status   Specimen Description BLOOD RIGHT HAND  Final    Special Requests BOTTLES DRAWN AEROBIC AND ANAEROBIC 6CC EACH  Final   Culture NO GROWTH 3 DAYS  Final   Report Status PENDING  Incomplete  Blood Culture (routine x 2)     Status: None (Preliminary result)   Collection Time: 08/08/16  4:39 PM  Result Value Ref Range Status   Specimen Description BLOOD RIGHT HAND  Final   Special Requests BOTTLES DRAWN AEROBIC ONLY [place] AEB  Final   Culture NO GROWTH 3 DAYS  Final   Report Status PENDING  Incomplete    Radiology Studies: No results found. Scheduled Meds: . aspirin EC  325 mg Oral Daily  . [START ON 08/12/2016] aspirin  325 mg Oral Daily  . carvedilol  3.125 mg Oral BID WC  . cefTRIAXone (ROCEPHIN)  IV  2 g Intravenous Q24H  . colchicine  0.6 mg Oral BID  . enoxaparin (LOVENOX) injection  40 mg Subcutaneous Q24H  . feeding supplement (GLUCERNA SHAKE)  237 mL Oral BID BM  . insulin aspart  0-15 Units Subcutaneous TID WC  . insulin glargine  20 Units Subcutaneous Daily  . levothyroxine  100 mcg Oral QAC breakfast  . pregabalin  200 mg Oral BID  . vancomycin  1,000 mg Intravenous Q24H   Continuous Infusions: . sodium chloride 1 mL/kg/hr (08/11/16 1649)  . lactated ringers 125 mL/hr at 08/11/16 0624     LOS: 3 days   Time spent: 30 minutes. Greater than 50% of this time was spent in direct contact with the patient coordinating care.  Faye Ramsay, MD [facility] Pager [phone]  If 7PM-7AM, please contact night-coverage [email or web address] Password [facility] 08/11/2016, 6:13 PM

## 2016-08-11 NOTE — Consult Note (Signed)
Consult Note  Patient name: Todd [name] MRN: [id number] DOB: 09/22/1955 Sex: male  Consulting Physician:  Hospitalists  Reason for Consult:  Chief Complaint  Patient presents with  . Leg Swelling    HISTORY OF PRESENT ILLNESS: This is a 61 year old gentleman who initially presented to the [facility] with bilateral lower extremity ulcers.  He states that he noticed that developed overnight, but had swollen feet day prior.  He also complains of burning in his feet.  He has been diagnosed with diabetes within the past year.  He is a current smoker.  He suffers from hyperlipidemia.  He is not on a statin.  He is on medication for hypertension.  Past Medical History:  Diagnosis Date  . Diabetes mellitus without complication (Todd [name])   . Goiter   . Gout   . Hyperlipidemia   . Hypertension   . Thyroid disease     Past Surgical History:  Procedure Laterality Date  . THYROID SURGERY      Social History   Social History  . Marital status: Single    Spouse name: N/A  . Number of children: N/A  . Years of education: N/A   Occupational History  . drives a forklift    Social History Main Topics  . Smoking status: Current Every Day Smoker    Packs/day: 0.75    Years: 45.00    Types: Cigarettes    Last attempt to quit: 11/12/2014  . Smokeless tobacco: Never Used  . Alcohol use No     Comment: h/o heavy use; stopped drinking in 7/16  . Drug use: No  . Sexual activity: Not Currently   Other Topics Concern  . Not on file   Social History Narrative  . No narrative on file    Family History  Problem Relation Age of Onset  . Heart disease Mother   . Pneumonia Father   . Diabetes Maternal Aunt   . Diabetes Maternal Uncle     Allergies as of 08/08/2016  . (No Known Allergies)    No current facility-administered medications on file prior to encounter.    Current Outpatient Prescriptions on File Prior to Encounter  Medication Sig Dispense Refill  .  amoxicillin-clavulanate (AUGMENTIN) 875-125 MG tablet Take 1 tablet by mouth 2 (two) times daily. Take all of this medication 20 tablet 0  . carvedilol (COREG) 3.125 MG tablet Take 1 tablet (3.125 mg total) by mouth 2 (two) times daily with a meal. 60 tablet 0  . COLCRYS 0.6 MG tablet Take 0.6 mg by mouth 2 (two) times daily.  1  . Insulin Glargine (LANTUS SOLOSTAR) 100 UNIT/ML Solostar Pen Inject 25 Units into the skin daily.    [name] levothyroxine (SYNTHROID, LEVOTHROID) 100 MCG tablet Take 1 tablet (100 mcg total) by mouth daily before breakfast. 30 tablet 0  . lisinopril (PRINIVIL,ZESTRIL) 10 MG tablet Take 1 tablet (10 mg total) by mouth daily. 30 tablet 0  . metFORMIN (GLUCOPHAGE) 500 MG tablet Take 1 tablet (500 mg total) by mouth daily with breakfast. 30 tablet 2  . pregabalin (LYRICA) 200 MG capsule Take 2 capsules (400 mg total) by mouth daily. (Patient taking differently: Take 200 mg by mouth 2 (two) times daily. ) 60 capsule 2  . triamterene-hydrochlorothiazide (MAXZIDE-25) 37.5-25 MG tablet Take 1 tablet by mouth daily. For blood pressure and fluid 90 tablet 3  . acetaminophen (TYLENOL) 325 MG tablet Take 2 tablets (650 mg total) by  mouth every 6 (six) hours as needed for mild pain or moderate pain. 30 tablet 0     REVIEW OF SYSTEMS: Cardiovascular: No chest pain, chest pressure, palpitations, orthopnea, or dyspnea on exertion. No claudication or rest pain,  No history of DVT or phlebitis. Pulmonary: No productive cough, asthma or wheezing. Neurologic: No weakness, paresthesias, aphasia, or amaurosis. No dizziness. Hematologic: No bleeding problems or clotting disorders. Musculoskeletal: No joint pain or joint swelling. Gastrointestinal: No blood in stool or hematemesis Genitourinary: No dysuria or hematuria. Psychiatric:: No history of major depression. Integumentary: Blistering in both lower extremities. Constitutional: No fever or chills.  PHYSICAL EXAMINATION: General: The  patient appears their stated age.  Vital signs are BP 92/62 (BP Location: Right Arm)   Pulse 96   Temp 100.1 F (37.8 C) (Oral)   Resp 18   Ht 5\' 8"  (1.727 m)   Wt 153 lb 9.6 oz (69.7 kg)   SpO2 99%   BMI 23.35 kg/m  Pulmonary: Respirations are non-labored HEENT:  No gross abnormalities Abdomen: Soft and non-tender  Musculoskeletal: There are no major deformities.   Neurologic: No focal weakness or paresthesias are detected, Skin: blistering and gangrenous areas to both feet, left > right Psychiatric: The patient has normal affect. Cardiovascular: There is a regular rate and rhythm.  Palpable femoral pulses bilaterally  Diagnostic Studies: Vascular lab studies:   RIGHT LEFT  ABI 0.29 inaudible      Assessment:  Lateral lower extremity ulceration, left greater than right in the setting of severe vascular insufficiency Plan: I discussed with the patient and his mother that this is a limb threatening situation.  We discussed proceeding with angiography to define his anatomy and determine what options for revascularization are available.  If he is a candidate for percutaneous intervention, we would proceed at the time of his arteriogram.  His left leg will need to be treated first as it is the worse of the 2 but both will need to be addressed in the near future.  I'm going to make him nothing by mouth for possible angiogram today.  Patient would benefit from a statin for hypercholesterolemia.  He needs counseling on smoking cessation.  He'll need at least an aspirin for antiplatelet therapy.  I will make him nothing by mouth after breakfast     V. Leia Alf, M.D. [facility] [place] Office: [phone] Pager:  [phone]

## 2016-08-11 NOTE — Op Note (Signed)
Patient name: Todd [name] MRN: [id number] DOB: [birth year] Sex: male  08/11/2016 Pre-operative Diagnosis: bilateral lower extremity critical limb ischemia Post-operative diagnosis:  Same Surgeon:  Eda Paschal. Donzetta Matters, MD Procedure Performed: 1.  US guided cannulation of right common femoral artery 2.  Aortogram with bilateral lower extremity runoff 3.  Drug coated balloon angioplasty of left sfa and popliteal arteries with 64mm impact admiral 4.  Moderate sedation for 75 minutes with versed and fentanyl  Indications:  61 year old male presents with bilateral foot wounds left greater than right has palpable femoral pulses severely diminished ABIs bilaterally. He is therefore indicated for  Findings: The aorta and iliac segments are calcified without flow limiting disease. The right common femoral artery was diseased on ultrasound and there are multilevel stenoses of the right SFA. The SFA is occluded and reconstitutes the popliteal which is also occluded below the knee. There is an anterior tibial artery that has patency but the foot could not be evaluated. On the left side the common femoral artery is diseased as is the profunda femoris artery. The SFA is patent for approximately 10 cm with one area of high-grade stenosis. It then occludes for a long segment approximately 20 cm and reconstitutes above the knee. Runoff is via the anterior tibial artery which occludes above the ankle. Following intervention there was no residual stenoses in the sfa nor flow limiting dissection and there was in-line flow to the anterior tibial artery but no possible interventions could be performed as there is no flow to the foot on the left side.   Patient will likely need a below-knee amputation on the left and further angiographic evaluation of the right.   Procedure:  The patient was identified in the holding area and taken to room 8.  The patient was then placed supine on the table and prepped and draped in the  usual sterile fashion.  A time out was called.  Ultrasound was used to evaluate the right common femoral artery.  It was patent .  A digital ultrasound image was acquired.  A micropuncture needle was used to access the right common femoral artery under ultrasound guidance.  An 018 wire was advanced without resistance and a micropuncture sheath was placed.  The 018 wire was removed and a benson wire was placed.  The micropuncture sheath was exchanged for a 5 french sheath.  An omniflush catheter was advanced over the wire to the level of L-1.  An abdominal angiogram was obtained followed by bilateral lower extremity runoff with the above findings. We then used the Omni catheter to go up and over and placed a long 7 French sheath into the left common femoral artery. Patient this time was heparinized and remained so throughout the case. We then used Glidewire quick cross catheter to cross the occluded SFA and intraluminal fashion. We then performed angiography from the patent popliteal artery which demonstrated no runoff into the left foot. Because of a cross the lesion was elected to predilate with 4 mm balloon followed by drug coated balloon angioplasty with 5 mm balloon. There were flow-limiting dissections at conclusion and we did reinflate the balloon several times until flow-limiting dissection was no longer there and there was no residual stenosis. Satisfied with this the sheath was retracted and the right external iliac artery wire was removed and sheath will be pulled in postop holding area. Patient tolerated procedure well without immediate complication.  Contrast: 180cc  Erdine Hulen C. Donzetta Matters, MD [facility]  Office: [phone] Pager: [phone]

## 2016-08-11 NOTE — Progress Notes (Signed)
VASCULAR LAB         Patient had ABI done at [facility] 08/10/16.  Results can be found below as well as under results review.  Please advise if you would like a repeat study.  Right ABI was 0.29 and Left ABI was not ascertained secondary to inaudible pulses      RIGHT LEFT  ABI 0.29 inaudible     Avelardo Reesman, RVT 08/11/2016, 9:44 AM          RADIOLOGY REPORT    PATIENT NAME:  Todd [name] AUTH PROV: [employer]iva Signs  DOB/AGE: February 20, 1956 [id number] IP Loc: [id number]  MRN #: [id number]  ACCOUNT #: [id number]  REASON FOR EXAM: None Specified CC PROV:   ________________________________________________________________________________ [facility] [phone] [place] [id number] [place] [id number] No information on file.   ________________________________________________________________________________ EXAM(S) PERFORMED: EXAM DATE/TIME: ACCESSION:  US Arterial Seg Multiple 08/09/2016  3:16 PM [id number]    Read By: Markus Daft, MD      CLINICAL DATA:  Worsening right foot cellulitis. Left lower extremity ischemia with multiple ulcers.  EXAM: NONINVASIVE PHYSIOLOGIC VASCULAR STUDY OF BILATERAL LOWER EXTREMITIES  TECHNIQUE: Evaluation of both lower extremities was performed at rest, including calculation of ankle-brachial indices, multiple segmental pressure evaluation, segmental Doppler and segmental pulse volume recording.  COMPARISON:  None.  FINDINGS: Right ABI:  0.29  Left ABI: 0.00. Doppler signal not detected in the left dorsalis pedis artery or left posterior tibial artery.  Right Lower Extremity: Monophasic waveforms throughout the right lower extremity. Low amplitude PVR waveforms in the right lower extremity. Unable to detect signal in the right DP.  Left Lower Extremity: There may be some biphasic and triphasic Doppler waveforms in the left femoral region. Monophasic waveforms in the left SFA. No  significant signal in the left popliteal artery. No significant signal identified in the left posterior tibial artery or left dorsalis pedis artery. Abnormal PVR waveforms. Segmental pressures not obtained in the left upper leg.  IMPRESSION: Left ABI could not be obtained. Left ankle arteries appear to be occluded.  Severe occlusive arterial disease in the right lower extremity with ABI of 0.29.   Electronically Signed   By: Markus Daft M.D.   On: 08/09/2016 16:59   Signed By:  Markus Daft, MD on 08/09/2016  4:59 PM     IF TRANSMITTAL IS NOT RECEIVED COMPLETELY, PLEASE NOTIFY THE SENDER IMMEDIATELY.  This message is intended for the use of the individual or entity to which it is addressed and may contain information that is privileged, confidential, and exempt from disclosure under applicable law. If the reader of this message is not the intended recipient or the employee or agent responsible for delivering the message to the intended recipient, you are hereby notified that any dissemination, distribution or copying of this communication is strictly prohibited. If you have received this communication in error please notify our Manufacturing systems engineer at [phone].     Thank you,   Phoebe Marter, RVT 08/11/2016, 9:40 AM

## 2016-08-12 ENCOUNTER — Inpatient Hospital Stay (HOSPITAL_COMMUNITY): Payer: BLUE CROSS/BLUE SHIELD | Admitting: Certified Registered Nurse Anesthetist

## 2016-08-12 ENCOUNTER — Encounter (HOSPITAL_COMMUNITY)
Admission: EM | Disposition: A | Discharge status: Skilled Nursing Facility | Payer: Self-pay | Source: Home / Self Care | Attending: Internal Medicine

## 2016-08-12 ENCOUNTER — Encounter (HOSPITAL_COMMUNITY): Payer: Self-pay | Admitting: Vascular Surgery

## 2016-08-12 ENCOUNTER — Other Ambulatory Visit (INDEPENDENT_AMBULATORY_CARE_PROVIDER_SITE_OTHER): Payer: Self-pay | Admitting: Orthopedic Surgery

## 2016-08-12 ENCOUNTER — Ambulatory Visit: Payer: BLUE CROSS/BLUE SHIELD | Admitting: Family Medicine

## 2016-08-12 HISTORY — PX: AMPUTATION: SHX166

## 2016-08-12 LAB — CBC
HCT: 34.3 % — ABNORMAL LOW (ref 39.0–52.0)
HEMOGLOBIN: 11.3 g/dL — AB (ref 13.0–17.0)
MCH: 27.8 pg (ref 26.0–34.0)
MCHC: 32.9 g/dL (ref 30.0–36.0)
MCV: 84.3 fL (ref 78.0–100.0)
PLATELETS: 598 10*3/uL — AB (ref 150–400)
RBC: 4.07 MIL/uL — AB (ref 4.22–5.81)
RDW: 14.1 % (ref 11.5–15.5)
WBC: 23.1 10*3/uL — ABNORMAL HIGH (ref 4.0–10.5)

## 2016-08-12 LAB — BASIC METABOLIC PANEL
ANION GAP: 13 (ref 5–15)
BUN: 6 mg/dL (ref 6–20)
CHLORIDE: 100 mmol/L — AB (ref 101–111)
CO2: 25 mmol/L (ref 22–32)
Calcium: 8 mg/dL — ABNORMAL LOW (ref 8.9–10.3)
Creatinine, Ser: 1.12 mg/dL (ref 0.61–1.24)
GFR calc Af Amer: >60 mL/min (ref >60)
GLUCOSE: 64 mg/dL — AB (ref 65–99)
POTASSIUM: 3.3 mmol/L — AB (ref 3.5–5.1)
Sodium: 138 mmol/L (ref 135–145)

## 2016-08-12 LAB — GLUCOSE, CAPILLARY
GLUCOSE-CAPILLARY: 117 mg/dL — AB (ref 65–99)
GLUCOSE-CAPILLARY: 102 mg/dL — AB (ref 65–99)
GLUCOSE-CAPILLARY: 115 mg/dL — AB (ref 65–99)
Glucose-Capillary: 91 mg/dL (ref 65–99)
Glucose-Capillary: 88 mg/dL (ref 65–99)

## 2016-08-12 SURGERY — AMPUTATION BELOW KNEE
Anesthesia: Monitor Anesthesia Care | Site: Leg Lower | Laterality: Left

## 2016-08-12 SURGERY — AMPUTATION BELOW KNEE
Anesthesia: General | Laterality: Left

## 2016-08-12 MED ORDER — 0.9 % SODIUM CHLORIDE (POUR BTL) OPTIME
TOPICAL | Status: DC | PRN
  Administered 2016-08-12: 1000 mL

## 2016-08-12 MED ORDER — FENTANYL CITRATE (PF) 100 MCG/2ML IJ SOLN
INTRAMUSCULAR | Status: AC
  Filled 2016-08-12: qty 2

## 2016-08-12 MED ORDER — TRANEXAMIC ACID 1000 MG/10ML IV SOLN
2000.0000 mg | INTRAVENOUS | Status: AC
  Administered 2016-08-12: 2000 mg via TOPICAL
  Filled 2016-08-12: qty 20

## 2016-08-12 MED ORDER — ACETAMINOPHEN 650 MG RE SUPP
650.0000 mg | Freq: Four times a day (QID) | RECTAL | Status: DC | PRN
Start: 2016-08-12 — End: 2016-08-12

## 2016-08-12 MED ORDER — POLYETHYLENE GLYCOL 3350 17 G PO PACK: 17.0000 g | PACK | Freq: Every day | ORAL | Status: DC | PRN

## 2016-08-12 MED ORDER — PHENYLEPHRINE HCL 10 MG/ML IJ SOLN
INTRAMUSCULAR | Status: DC | PRN
  Administered 2016-08-12: 80 ug via INTRAVENOUS
  Administered 2016-08-12 (×4): 40 ug via INTRAVENOUS

## 2016-08-12 MED ORDER — DOCUSATE SODIUM 100 MG PO CAPS
100.0000 mg | ORAL_CAPSULE | Freq: Two times a day (BID) | ORAL | Status: DC
  Administered 2016-08-12 – 2016-08-19 (×10): 100 mg via ORAL
  Filled 2016-08-12 (×11): qty 1

## 2016-08-12 MED ORDER — MIDAZOLAM HCL 2 MG/2ML IJ SOLN
INTRAMUSCULAR | Status: AC
  Filled 2016-08-12: qty 2

## 2016-08-12 MED ORDER — MAGNESIUM CITRATE PO SOLN: 1.0000 | Freq: Once | ORAL | Status: DC | PRN

## 2016-08-12 MED ORDER — OXYCODONE HCL 5 MG PO TABS
5.0000 mg | ORAL_TABLET | ORAL | Status: DC | PRN
  Filled 2016-08-12: qty 2

## 2016-08-12 MED ORDER — ONDANSETRON HCL 4 MG/2ML IJ SOLN
INTRAMUSCULAR | Status: AC
  Filled 2016-08-12: qty 2

## 2016-08-12 MED ORDER — ONDANSETRON HCL 4 MG/2ML IJ SOLN: 4.0000 mg | Freq: Once | INTRAMUSCULAR | Status: DC | PRN

## 2016-08-12 MED ORDER — ONDANSETRON HCL 4 MG/2ML IJ SOLN: 4.0000 mg | Freq: Four times a day (QID) | INTRAMUSCULAR | Status: DC | PRN

## 2016-08-12 MED ORDER — METHOCARBAMOL 500 MG PO TABS: 500.0000 mg | ORAL_TABLET | Freq: Four times a day (QID) | ORAL | Status: DC | PRN

## 2016-08-12 MED ORDER — BISACODYL 10 MG RE SUPP: 10.0000 mg | Freq: Every day | RECTAL | Status: DC | PRN

## 2016-08-12 MED ORDER — PROPOFOL 10 MG/ML IV BOLUS
INTRAVENOUS | Status: DC | PRN
  Administered 2016-08-12 (×2): 20 mg via INTRAVENOUS
  Administered 2016-08-12: 10 mg via INTRAVENOUS

## 2016-08-12 MED ORDER — SODIUM CHLORIDE 0.9 % IV SOLN
INTRAVENOUS | Status: DC
  Administered 2016-08-12 – 2016-08-16 (×2): via INTRAVENOUS

## 2016-08-12 MED ORDER — LIDOCAINE 2% (20 MG/ML) 5 ML SYRINGE
INTRAMUSCULAR | Status: AC
  Filled 2016-08-12: qty 5

## 2016-08-12 MED ORDER — PROPOFOL 10 MG/ML IV BOLUS
INTRAVENOUS | Status: AC
  Filled 2016-08-12: qty 20

## 2016-08-12 MED ORDER — HYDROMORPHONE HCL 1 MG/ML IJ SOLN
1.0000 mg | INTRAMUSCULAR | Status: DC | PRN
  Administered 2016-08-13 – 2016-08-15 (×5): 1 mg via INTRAVENOUS
  Filled 2016-08-12 (×6): qty 1

## 2016-08-12 MED ORDER — OXYCODONE HCL 5 MG PO TABS: 5.0000 mg | ORAL_TABLET | Freq: Once | ORAL | Status: DC | PRN

## 2016-08-12 MED ORDER — DEXTROSE 5 % IV SOLN
INTRAVENOUS | Status: DC | PRN
  Administered 2016-08-12: 1.5 g via INTRAVENOUS

## 2016-08-12 MED ORDER — ONDANSETRON HCL 4 MG PO TABS: 4.0000 mg | ORAL_TABLET | Freq: Four times a day (QID) | ORAL | Status: DC | PRN

## 2016-08-12 MED ORDER — METHOCARBAMOL 1000 MG/10ML IJ SOLN
500.0000 mg | Freq: Four times a day (QID) | INTRAVENOUS | Status: DC | PRN
  Filled 2016-08-12: qty 5

## 2016-08-12 MED ORDER — METOCLOPRAMIDE HCL 5 MG PO TABS: 5.0000 mg | ORAL_TABLET | Freq: Three times a day (TID) | ORAL | Status: DC | PRN

## 2016-08-12 MED ORDER — MIDAZOLAM HCL 2 MG/2ML IJ SOLN
INTRAMUSCULAR | Status: DC | PRN
  Administered 2016-08-12 (×2): .5 mg via INTRAVENOUS

## 2016-08-12 MED ORDER — METOCLOPRAMIDE HCL 5 MG/ML IJ SOLN: 5.0000 mg | Freq: Three times a day (TID) | INTRAMUSCULAR | Status: DC | PRN

## 2016-08-12 MED ORDER — ACETAMINOPHEN 325 MG PO TABS: 650.0000 mg | ORAL_TABLET | Freq: Four times a day (QID) | ORAL | Status: DC | PRN

## 2016-08-12 MED ORDER — FENTANYL CITRATE (PF) 100 MCG/2ML IJ SOLN: 25.0000 ug | INTRAMUSCULAR | Status: DC | PRN

## 2016-08-12 MED ORDER — OXYCODONE HCL 5 MG/5ML PO SOLN: 5.0000 mg | Freq: Once | ORAL | Status: DC | PRN

## 2016-08-12 MED ORDER — DEXTROSE 5 % IV SOLN
1.5000 g | INTRAVENOUS | Status: AC
  Filled 2016-08-12: qty 1.5

## 2016-08-12 MED FILL — Heparin Sodium (Porcine) 2 Unit/ML in Sodium Chloride 0.9%: INTRAMUSCULAR | Qty: 500 | Status: AC

## 2016-08-12 SURGICAL SUPPLY — 32 items
BLADE SAW RECIP 87.9 MT (BLADE) ×2 IMPLANT
BLADE SURG 21 STRL SS (BLADE) ×2 IMPLANT
BNDG COHESIVE 6X5 TAN STRL LF (GAUZE/BANDAGES/DRESSINGS) ×4 IMPLANT
BNDG GAUZE ELAST 4 BULKY (GAUZE/BANDAGES/DRESSINGS) ×4 IMPLANT
CANISTER WOUND CARE 500ML ATS (WOUND CARE) ×1 IMPLANT
COLLECTOR WOUND DRAINAGE MED (WOUND CARE) ×1 IMPLANT
COVER SURGICAL LIGHT HANDLE (MISCELLANEOUS) ×2 IMPLANT
CUFF TOURNIQUET SINGLE 34IN LL (TOURNIQUET CUFF) IMPLANT
CUFF TOURNIQUET SINGLE 44IN (TOURNIQUET CUFF) IMPLANT
DRAPE INCISE IOBAN 66X45 STRL (DRAPES) IMPLANT
DRAPE U-SHAPE 47X51 STRL (DRAPES) ×2 IMPLANT
DRSG VAC ATS MED SENSATRAC (GAUZE/BANDAGES/DRESSINGS) ×2 IMPLANT
ELECT REM PT RETURN 9FT ADLT (ELECTROSURGICAL) ×2
ELECTRODE REM PT RTRN 9FT ADLT (ELECTROSURGICAL) ×1 IMPLANT
GLOVE BIOGEL PI IND STRL 9 (GLOVE) ×1 IMPLANT
GLOVE BIOGEL PI INDICATOR 9 (GLOVE) ×1
GLOVE SURG ORTHO 9.0 STRL STRW (GLOVE) ×2 IMPLANT
GOWN STRL REUS W/ TWL XL LVL3 (GOWN DISPOSABLE) ×2 IMPLANT
GOWN STRL REUS W/TWL XL LVL3 (GOWN DISPOSABLE) ×4
KIT BASIN OR (CUSTOM PROCEDURE TRAY) ×2 IMPLANT
KIT ROOM TURNOVER OR (KITS) ×2 IMPLANT
MANIFOLD NEPTUNE II (INSTRUMENTS) ×2 IMPLANT
NS IRRIG 1000ML POUR BTL (IV SOLUTION) ×2 IMPLANT
PACK ORTHO EXTREMITY (CUSTOM PROCEDURE TRAY) ×2 IMPLANT
PAD ARMBOARD 7.5X6 YLW CONV (MISCELLANEOUS) ×2 IMPLANT
SPONGE LAP 18X18 X RAY DECT (DISPOSABLE) IMPLANT
STAPLER VISISTAT 35W (STAPLE) IMPLANT
STOCKINETTE IMPERVIOUS LG (DRAPES) ×2 IMPLANT
SUT SILK 2 0 (SUTURE) ×2
SUT SILK 2-0 18XBRD TIE 12 (SUTURE) ×1 IMPLANT
SUT VIC AB 1 CTX 27 (SUTURE) IMPLANT
TOWEL OR 17X26 10 PK STRL BLUE (TOWEL DISPOSABLE) ×2 IMPLANT

## 2016-08-12 NOTE — Progress Notes (Signed)
PROGRESS NOTE   Todd [name]  XFG:[id number] DOB: [birth year] DOA: 08/08/2016   PCP: [name], MD   Brief Narrative:  61 year old man admitted to the hospital from home on 2/26 due to "leg blisters". Thought to have gangrene of left leg and possible cellulitis of the right lower extremity. Admission requested for further evaluation and management.  Assessment & Plan:   Left lower extremity ischemic changes in the left foot extending to just above the ankle, sepsis  - Likely has peripheral vascular disease in the setting of complications from long-standing diabetes. - with gangrenous changes to the left foot worse than the right foot. - Ankle-brachial indices are ordered, right 0.29, left inaudible  - appreciate vascular surgery following, follow up on recommendations  - will need left below-knee amputation and further angiographic evaluation on right. - for possible below knee amputation by Dr. Sharol Given today  Thrombocytosis - reactive - CBC In AM  Hypokalemia - mild, supplement, BMP in AM  Type 2 diabetes with complications of PVD - improved with reduced lantus doses   Acute renal failure - resolved with IVF - BMP in AM  Hypothyroidism - Continue Synthroid   DVT prophylaxis: lovenox Code Status: full code Family Communication: mother at bedside updated on plan of care and all questions answered Disposition Plan: to be determined   Consultants:   Surgery  Vascular surgery   Antimicrobials:  Anti-infectives    Start     Dose/Rate Route Frequency Ordered Stop   08/12/16 0845  cefUROXime (ZINACEF) 1.5 g in dextrose 5 % 50 mL IVPB     1.5 g 100 mL/hr over 30 Minutes Intravenous On call to O.R. 08/12/16 0830 08/13/16 0559   08/09/16 1600  vancomycin (VANCOCIN) IVPB 1000 mg/200 mL premix     1,000 mg 200 mL/hr over 60 Minutes Intravenous Every 24 hours 08/08/16 2153     08/08/16 2200  cefTRIAXone (ROCEPHIN) 2 g in dextrose 5 % 50 mL IVPB     2 g 100 mL/hr over  30 Minutes Intravenous Every 24 hours 08/08/16 2056     08/08/16 2200  metroNIDAZOLE (FLAGYL) tablet 500 mg  Status:  Discontinued     500 mg Oral Every 8 hours 08/08/16 2056 08/09/16 1554   08/08/16 1615  vancomycin (VANCOCIN) IVPB 1000 mg/200 mL premix     1,000 mg 200 mL/hr over 60 Minutes Intravenous  Once 08/08/16 1610 08/08/16 1801   08/08/16 1615  piperacillin-tazobactam (ZOSYN) IVPB 3.375 g     3.375 g 100 mL/hr over 30 Minutes Intravenous  Once 08/08/16 1610 08/08/16 1720     Subjective: no complaints this morning  Objective: Vitals:   08/11/16 1810 08/11/16 2009 08/12/16 0542 08/12/16 1242  BP: (!) 142/82 128/76 115/67 110/83  Pulse: 90 91 100 81  Resp: (!) 22 (!) 21 20 18   Temp:  99.4 F (37.4 C) 98.7 F (37.1 C) 98.6 F (37 C)  TempSrc:  Oral Oral Oral  SpO2:  100% 100% 98%  Weight:      Height:        Intake/Output Summary (Last 24 hours) at 08/12/16 1325 Last data filed at 08/12/16 1027  Gross per 24 hour  Intake                0 ml  Output              775 ml  Net             -775 ml  Filed Weights   08/08/16 1600 08/08/16 2052 08/10/16 0600  Weight: 77.1 kg (170 lb) 70.9 kg (156 lb 4.8 oz) 69.7 kg (153 lb 9.6 oz)    Examination:  General exam: Alert, awake, oriented x 3 Respiratory system: Clear to auscultation. Respiratory effort normal. Cardiovascular system:RRR. No murmurs, rubs, gallops. Gastrointestinal system: Abdomen is nondistended, soft and nontender. No organomegaly or masses felt. Normal bowel sounds heard. Psychiatry: Judgement and insight appear normal. Mood & affect appropriate.   Data Reviewed: I have personally reviewed following labs and imaging studies  CBC:  Recent Labs Lab 08/05/16 1630 08/08/16 1617 08/08/16 1626 08/09/16 0523 08/10/16 0422 08/12/16 0332  WBC 26.9* 22.7*  --  22.9* 25.1* 23.1*  NEUTROABS 20.7* 17.7*  --   --   --   --   HGB  --  12.5* 14.3 11.4* 11.7* 11.3*  HCT 36.1* 36.3* 42.0 33.6* 34.5* 34.3*    MCV 84 84.0  --  83.2 83.1 84.3  PLT 793* 710*  --  722* 741* 631*   Basic Metabolic Panel:  Recent Labs Lab 08/08/16 1617 08/08/16 1626 08/09/16 0523 08/10/16 0422 08/12/16 0332  NA 133* 136 131* 133* 138  K 3.6 3.7 4.0 3.8 3.3*  CL 102 103 107 104 100*  CO2 20*  --  17* 19* 25  GLUCOSE 107* 105* 67 115* 64*  BUN 33* 32* 25* 14 6  CREATININE 1.79* 1.80* 1.26* 1.03 1.12  CALCIUM 8.5*  --  7.8* 8.2* 8.0*   Liver Function Tests:  Recent Labs Lab 08/08/16 1617  AST 50*  ALT 40  ALKPHOS 87  BILITOT 0.6  PROT 8.6*  ALBUMIN 2.4*   Coagulation Profile:  Recent Labs Lab 08/08/16 2057  INR 1.11   CBG:  Recent Labs Lab 08/11/16 1133 08/11/16 1659 08/11/16 2147 08/12/16 0644 08/12/16 1119  GLUCAP 115* 92 162* 117* 102*   Urine analysis:    Component Value Date/Time   COLORURINE YELLOW 08/08/2016 1610   APPEARANCEUR CLEAR 08/08/2016 1610   APPEARANCEUR Clear 08/05/2016 1630   LABSPEC 1.011 08/08/2016 1610   PHURINE 5.0 08/08/2016 1610   GLUCOSEU NEGATIVE 08/08/2016 1610   HGBUR NEGATIVE 08/08/2016 1610   BILIRUBINUR NEGATIVE 08/08/2016 1610   BILIRUBINUR Negative 08/05/2016 1630   KETONESUR NEGATIVE 08/08/2016 1610   PROTEINUR NEGATIVE 08/08/2016 1610   NITRITE NEGATIVE 08/08/2016 1610   LEUKOCYTESUR NEGATIVE 08/08/2016 1610   LEUKOCYTESUR Negative 08/05/2016 1630    Recent Results (from the past 240 hour(s))  Microscopic Examination     Status: None   Collection Time: 08/05/16  4:30 PM  Result Value Ref Range Status   WBC, UA None seen 0 - 5 /hpf Final   RBC, UA 0-2 0 - 2 /hpf Final   Epithelial Cells (non renal) None seen 0 - 10 /hpf Final   Renal Epithel, UA None seen None seen /hpf Final   Mucus, UA Present Not Estab. Final   Bacteria, UA None seen None seen/Few Final  Blood Culture (routine x 2)     Status: None (Preliminary result)   Collection Time: 08/08/16  4:31 PM  Result Value Ref Range Status   Specimen Description BLOOD RIGHT HAND   Final   Special Requests BOTTLES DRAWN AEROBIC AND ANAEROBIC 6CC EACH  Final   Culture NO GROWTH 4 DAYS  Final   Report Status PENDING  Incomplete  Blood Culture (routine x 2)     Status: None (Preliminary result)   Collection Time: 08/08/16  4:39  PM  Result Value Ref Range Status   Specimen Description BLOOD RIGHT HAND  Final   Special Requests BOTTLES DRAWN AEROBIC ONLY 6CC AEB  Final   Culture NO GROWTH 4 DAYS  Final   Report Status PENDING  Incomplete    Radiology Studies: No results found. Scheduled Meds: . aspirin EC  325 mg Oral Daily  . carvedilol  3.125 mg Oral BID WC  . cefTRIAXone (ROCEPHIN)  IV  2 g Intravenous Q24H  . cefUROXime (ZINACEF)  IV  1.5 g Intravenous On Call to OR  . colchicine  0.6 mg Oral BID  . enoxaparin (LOVENOX) injection  40 mg Subcutaneous Q24H  . feeding supplement (GLUCERNA SHAKE)  237 mL Oral BID BM  . insulin aspart  0-15 Units Subcutaneous TID WC  . insulin glargine  20 Units Subcutaneous Daily  . levothyroxine  100 mcg Oral QAC breakfast  . pregabalin  200 mg Oral BID  . vancomycin  1,000 mg Intravenous Q24H   Continuous Infusions: . lactated ringers 125 mL/hr at 08/11/16 0624     LOS: 4 days   Time spent: 30 minutes. Greater than 50% of this time was spent in direct contact with the patient coordinating care.  Faye Ramsay, MD [facility] Pager [phone]  If 7PM-7AM, please contact night-coverage [email or web address] Password [email or web address] 08/12/2016, 1:25 PM

## 2016-08-12 NOTE — Anesthesia Preprocedure Evaluation (Addendum)
Anesthesia Evaluation  Patient identified by MRN, date of birth, ID band Patient awake    Reviewed: Allergy & Precautions, NPO status , Patient's Chart, lab work & pertinent test results  History of Anesthesia Complications Negative for: history of anesthetic complications  Airway Mallampati: II  TM Distance: >3 FB Neck ROM: Full    Dental  (+) Teeth Intact   Pulmonary neg shortness of breath, neg COPD, neg recent URI, Current Smoker,    breath sounds clear to auscultation       Cardiovascular hypertension, Pt. on medications and Pt. on home beta blockers  Rhythm:Regular     Neuro/Psych  Neuromuscular disease negative psych ROS   GI/Hepatic negative GI ROS, Neg liver ROS,   Endo/Other  diabetes, Type 2, Insulin DependentHypothyroidism   Renal/GU Renal InsufficiencyRenal disease     Musculoskeletal   Abdominal   Peds  Hematology  (+) anemia ,   Anesthesia Other Findings   Reproductive/Obstetrics                            Anesthesia Physical Anesthesia Plan  ASA: III  Anesthesia Plan: MAC and Regional   Post-op Pain Management:    Induction: Intravenous  Airway Management Planned: Simple Face Mask, Natural Airway and Nasal Cannula  Additional Equipment: None  Intra-op Plan:   Post-operative Plan: Extubation in OR  Informed Consent: I have reviewed the patients History and Physical, chart, labs and discussed the procedure including the risks, benefits and alternatives for the proposed anesthesia with the patient or authorized representative who has indicated his/her understanding and acceptance.     Plan Discussed with: CRNA and Surgeon  Anesthesia Plan Comments:        Anesthesia Quick Evaluation

## 2016-08-12 NOTE — Op Note (Signed)
   Date of Surgery: 08/12/2016  INDICATIONS: Todd [name] is a 61 y.o.-year-old male who has severe peripheral vascular disease. Arteriogram study shows no flow distal to the ankle and patient presented this time for transtibial amputation for gangrenous left foot.[name]  PREOPERATIVE DIAGNOSIS: Gangrene left foot  POSTOPERATIVE DIAGNOSIS: Same.  PROCEDURE: Transtibial amputation Application of Prevena wound VAC Application of stump shrinker  SURGEON: Sharol Given, M.D.  ANESTHESIA:  general  IV FLUIDS AND URINE: See anesthesia.  ESTIMATED BLOOD LOSS: Minimal mL.  COMPLICATIONS: None.  DESCRIPTION OF PROCEDURE: The patient was brought to the operating room and underwent a general anesthetic. After adequate levels of anesthesia were obtained patient's lower extremity was prepped using DuraPrep draped into a sterile field. A timeout was called. The foot was draped out of the sterile field with impervious stockinette. A transverse incision was made 11 cm distal to the tibial tubercle. This curved proximally and a large posterior flap was created. The tibia was transected 1 cm proximal to the skin incision. The fibula was transected just proximal to the tibial incision. The tibia was beveled anteriorly. A large posterior flap was created. The sciatic nerve was pulled cut and allowed to retract. The vascular bundles were suture ligated with 2-0 silk. The deep and superficial fascial layers were closed using #1 Vicryl. The skin was closed using staples and 2-0 nylon. The wound was covered with a Prevena wound VAC. There was a good suction fit. A prosthetic shrinker was applied. Patient was extubated taken to the PACU in stable condition.  [name], MD [place] 4:31 PM

## 2016-08-12 NOTE — Progress Notes (Signed)
Inpatient Diabetes Program Recommendations  AACE/ADA: New Consensus Statement on Inpatient Glycemic Control (2015)  Target Ranges:  Prepandial:   less than 140 mg/dL      Peak postprandial:   less than 180 mg/dL (1-2 hours)      Critically ill patients:  140 - 180 mg/dL   Lab Results  Component Value Date   GLUCAP 91 08/12/2016   HGBA1C 6.8 (H) 08/08/2016    Review of Glycemic ControlResults for NYAIR, DEPAULO (MRN [id number]) as of 08/12/2016 14:48  Ref. Range 08/11/2016 07:26 08/11/2016 11:33 08/11/2016 16:59 08/11/2016 21:47 08/12/2016 06:44 08/12/2016 11:19 08/12/2016 14:41  Glucose-Capillary Latest Ref Range: 65 - 99 mg/dL 100 (H) 115 (H) 92 162 (H) 117 (H) 102 (H) 91   Inpatient Diabetes Program Recommendations:  Consider further reduction of Lantus to 16 units daily.   Thanks, Adah Perl, RN, BC-ADM Inpatient Diabetes Coordinator Pager [phone] (8a-5p)

## 2016-08-12 NOTE — Progress Notes (Signed)
ANTIBIOTIC CONSULT NOTE  Pharmacy Consult for Vancomycin Indication: Blistering gangrene  No Known Allergies  Patient Measurements: Height: 5\' 8"  (172.7 cm) Weight: 153 lb 9.6 oz (69.7 kg) IBW/kg (Calculated) : 68.4 Adjusted Body Weight:   Vital Signs: Temp: 98.7 F (37.1 C) (03/02 0542) Temp Source: Oral (03/02 0542) BP: 115/67 (03/02 0542) Pulse Rate: 100 (03/02 0542) Intake/Output from previous day: 03/01 0701 - 03/02 0700 In: 120 [P.O.:120] Out: 1025 [Urine:1025] Intake/Output from this shift: Total I/O In: 0  Out: 250 [Urine:250]  Labs:  Recent Labs  08/10/16 0422 08/12/16 0332  WBC 25.1* 23.1*  HGB 11.7* 11.3*  PLT 741* 598*  CREATININE 1.03 1.12   Estimated Creatinine Clearance: 67 mL/min (by C-G formula based on SCr of 1.12 mg/dL). No results for input(s): VANCOTROUGH, VANCOPEAK, VANCORANDOM, GENTTROUGH, GENTPEAK, GENTRANDOM, TOBRATROUGH, TOBRAPEAK, TOBRARND, AMIKACINPEAK, AMIKACINTROU, AMIKACIN in the last 72 hours.   Microbiology: Recent Results (from the past 720 hour(s))  Microscopic Examination     Status: None   Collection Time: 08/05/16  4:30 PM  Result Value Ref Range Status   WBC, UA None seen 0 - 5 /hpf Final   RBC, UA 0-2 0 - 2 /hpf Final   Epithelial Cells (non renal) None seen 0 - 10 /hpf Final   Renal Epithel, UA None seen None seen /hpf Final   Mucus, UA Present Not Estab. Final   Bacteria, UA None seen None seen/Few Final  Blood Culture (routine x 2)     Status: None (Preliminary result)   Collection Time: 08/08/16  4:31 PM  Result Value Ref Range Status   Specimen Description BLOOD RIGHT HAND  Final   Special Requests BOTTLES DRAWN AEROBIC AND ANAEROBIC 6CC EACH  Final   Culture NO GROWTH 4 DAYS  Final   Report Status PENDING  Incomplete  Blood Culture (routine x 2)     Status: None (Preliminary result)   Collection Time: 08/08/16  4:39 PM  Result Value Ref Range Status   Specimen Description BLOOD RIGHT HAND  Final   Special  Requests BOTTLES DRAWN AEROBIC ONLY 6CC AEB  Final   Culture NO GROWTH 4 DAYS  Final   Report Status PENDING  Incomplete    Medical History: Past Medical History:  Diagnosis Date  . Diabetes mellitus without complication ([place])   . Goiter   . Gout   . Hyperlipidemia   . Hypertension   . Thyroid disease    Assessment:  ID: Abx for blistering gangrene on bialteral lower extremities, L > R.  Tmax 99.4. WBC 23.1. Scr 1.12.  Antimicrobials this admission: Vancomycin 2/26 >>  Rocephin 2/26 >>  Flagyl 2/26>>2/27 Zosyn 3.375gm IV x 1 2/26  2/26 BCx: ngtd  Goal of Therapy:  Vancomycin trough level 15-20 mcg/ml  Plan:  Vancomycin 1g/24h VT today if not in OR Possibly L BKA today   Nishtha Raider S. Alford Highland, PharmD, BCPS Clinical Staff Pharmacist Pager [phone]  Eilene Ghazi Stillinger 08/12/2016,11:47 AM

## 2016-08-12 NOTE — Progress Notes (Addendum)
[facility] Progress Note  Subjective  - POD #1  No complaints this morning.   Objective Vitals:   08/11/16 2009 08/12/16 0542  BP: 128/76 115/67  Pulse: 91 100  Resp: (!) 21 20  Temp: 99.4 F (37.4 C) 98.7 F (37.1 C)    Intake/Output Summary (Last 24 hours) at 08/12/16 0806 Last data filed at 08/11/16 1920  Gross per 24 hour  Intake              120 ml  Output             1025 ml  Net             -905 ml   Alert and oriented to person, place, situation. Knows who the president is.  Right groin soft without hematoma.   Assessment/Planning: 61 y.o. male with bilateral lower extremity limb ischemia s/p:  1.  US guided cannulation of right common femoral artery 2.  Aortogram with bilateral lower extremity runoff 3.  Drug coated balloon angioplasty of left sfa and popliteal arteries with 22mm impact admiral 4.  Moderate sedation for 75 minutes with versed and fentanyl 1 Day Post-Op   Patient will need left below-knee amputation and further angiographic evaluation on right. Patient is NPO but did eat some crackers at 0630. For possible below knee amputation by Dr. Sharol Given today. Wants to discuss with family first, but is agreeable to proceed.   Todd [name] 08/12/2016 8:06 AM -- Addendum  Spoke with patient's mother Lelan Pons this morning. She is agreeable with left below knee amputation. Her and her daughter will be coming to the hospital soon. Will post for left below knee amputation today with Dr. Sharol Given. Keep NPO.   Virgina Jock, PA-C  Laboratory CBC    Component Value Date/Time   WBC 23.1 (H) 08/12/2016 0332   HGB 11.3 (L) 08/12/2016 0332   HCT 34.3 (L) 08/12/2016 0332   HCT 36.1 (L) 08/05/2016 1630   PLT 598 (H) 08/12/2016 0332   PLT 793 (H) 08/05/2016 1630    BMET    Component Value Date/Time   NA 138 08/12/2016 0332   NA 136 08/03/2016 1119   K 3.3 (L) 08/12/2016 0332   CL 100 (L) 08/12/2016 0332   CO2 25 08/12/2016 0332   GLUCOSE  64 (L) 08/12/2016 0332   BUN 6 08/12/2016 0332   BUN 30 (H) 08/03/2016 1119   CREATININE 1.12 08/12/2016 0332   CALCIUM 8.0 (L) 08/12/2016 0332   GFRNONAA >60 08/12/2016 0332   GFRAA >60 08/12/2016 0332    COAG Lab Results  Component Value Date   INR 1.11 08/08/2016   No results found for: PTT  Antibiotics Anti-infectives    Start     Dose/Rate Route Frequency Ordered Stop   08/09/16 1600  vancomycin (VANCOCIN) IVPB 1000 mg/200 mL premix     1,000 mg 200 mL/hr over 60 Minutes Intravenous Every 24 hours 08/08/16 2153     08/08/16 2200  cefTRIAXone (ROCEPHIN) 2 g in dextrose 5 % 50 mL IVPB     2 g 100 mL/hr over 30 Minutes Intravenous Every 24 hours 08/08/16 2056     08/08/16 2200  metroNIDAZOLE (FLAGYL) tablet 500 mg  Status:  Discontinued     500 mg Oral Every 8 hours 08/08/16 2056 08/09/16 1554   08/08/16 1615  vancomycin (VANCOCIN) IVPB 1000 mg/200 mL premix     1,000 mg 200 mL/hr over 60 Minutes Intravenous  Once 08/08/16  1610 08/08/16 1801   08/08/16 1615  piperacillin-tazobactam (ZOSYN) IVPB 3.375 g     3.375 g 100 mL/hr over 30 Minutes Intravenous  Once 08/08/16 1610 08/08/16 [place], PA-C [facility] Office: [phone] Pager: [phone] 08/12/2016 8:06 AM

## 2016-08-12 NOTE — Progress Notes (Signed)
Attempted to call report to short stay, but no answer.pt has left for surgery.

## 2016-08-12 NOTE — Consult Note (Signed)
  Patient's arteriogram study showed good circulation down to the ankle but no circulation distal to the ankle. Patient had no reconstructable options and patient presents at this time for transtibial amputation. Risks and benefits were discussed including risk of the wound not healing need for higher level amputation. Patient states he understands and wishes to proceed at this time plan for left transtibial amputation.

## 2016-08-12 NOTE — Transfer of Care (Signed)
Immediate Anesthesia Transfer of Care Note  Patient: Todd [name]  Procedure(s) Performed: Procedure(s): LEFT BELOW KNEE AMPUTATION (Left)  Patient Location: PACU  Anesthesia Type:MAC  Level of Consciousness: patient cooperative and responds to stimulation  Airway & Oxygen Therapy: Patient Spontanous Breathing  Post-op Assessment: Report given to RN and Post -op Vital signs reviewed and stable  Post vital signs: Reviewed and stable  Last Vitals:  Vitals:   08/12/16 1242 08/12/16 1640  BP: 110/83   Pulse: 81 (P) 67  Resp: 18 (P) 12  Temp: 37 C (P) 36.4 C    Last Pain:  Vitals:   08/12/16 1640  TempSrc:   PainSc: (P) Asleep      Patients Stated Pain Goal: 2 (73/22/02 5427)  Complications: No apparent anesthesia complications

## 2016-08-13 LAB — BASIC METABOLIC PANEL
ANION GAP: 11 (ref 5–15)
BUN: 9 mg/dL (ref 6–20)
CO2: 22 mmol/L (ref 22–32)
Calcium: 7.2 mg/dL — ABNORMAL LOW (ref 8.9–10.3)
Chloride: 101 mmol/L (ref 101–111)
Creatinine, Ser: 1.07 mg/dL (ref 0.61–1.24)
GFR calc Af Amer: >60 mL/min (ref >60)
GFR calc non Af Amer: >60 mL/min (ref >60)
GLUCOSE: 117 mg/dL — AB (ref 65–99)
POTASSIUM: 3.3 mmol/L — AB (ref 3.5–5.1)
Sodium: 134 mmol/L — ABNORMAL LOW (ref 135–145)

## 2016-08-13 LAB — CBC
HEMATOCRIT: 28.9 % — AB (ref 39.0–52.0)
Hemoglobin: 9.4 g/dL — ABNORMAL LOW (ref 13.0–17.0)
MCH: 27.2 pg (ref 26.0–34.0)
MCHC: 32.5 g/dL (ref 30.0–36.0)
MCV: 83.5 fL (ref 78.0–100.0)
Platelets: 572 10*3/uL — ABNORMAL HIGH (ref 150–400)
RBC: 3.46 MIL/uL — ABNORMAL LOW (ref 4.22–5.81)
RDW: 13.8 % (ref 11.5–15.5)
WBC: 19.6 10*3/uL — AB (ref 4.0–10.5)

## 2016-08-13 LAB — CULTURE, BLOOD (ROUTINE X 2)
CULTURE: NO GROWTH
Culture: NO GROWTH

## 2016-08-13 LAB — GLUCOSE, CAPILLARY
GLUCOSE-CAPILLARY: 120 mg/dL — AB (ref 65–99)
GLUCOSE-CAPILLARY: 93 mg/dL (ref 65–99)
Glucose-Capillary: 75 mg/dL (ref 65–99)
Glucose-Capillary: 119 mg/dL — ABNORMAL HIGH (ref 65–99)

## 2016-08-13 MED ORDER — ROPIVACAINE HCL 7.5 MG/ML IJ SOLN
INTRAMUSCULAR | Status: DC | PRN
  Administered 2016-08-12 (×2): 10 mL via PERINEURAL

## 2016-08-13 MED ORDER — POTASSIUM CHLORIDE CRYS ER 20 MEQ PO TBCR
40.0000 meq | EXTENDED_RELEASE_TABLET | Freq: Once | ORAL | Status: AC
  Administered 2016-08-13: 40 meq via ORAL
  Filled 2016-08-13: qty 2

## 2016-08-13 MED ORDER — BUPIVACAINE-EPINEPHRINE (PF) 0.5% -1:200000 IJ SOLN
INTRAMUSCULAR | Status: DC | PRN
  Administered 2016-08-12: 10 mL
  Administered 2016-08-12: 20 mL

## 2016-08-13 NOTE — Anesthesia Procedure Notes (Signed)
Anesthesia Regional Block: Popliteal block   Pre-Anesthetic Checklist: ,, timeout performed, Correct Patient, Correct Site, Correct Laterality, Correct Procedure, Correct Position, site marked, Risks and benefits discussed,  Surgical consent,  Pre-op evaluation,  At surgeon's request and post-op pain management  Laterality: Lower and Left  Prep: chloraprep       Needles:  Injection technique: Single-shot  Needle Type: Echogenic Needle          Additional Needles:   Procedures: ultrasound guided, nerve stimulator,,,,,,   Nerve Stimulator or Paresthesia:  Response: plantarflexion, 0.4 mA,   Additional Responses:   Narrative:  Start time: 08/12/2016 3:01 PM End time: 08/12/2016 3:13 PM Injection made incrementally with aspirations every 5 mL.  Performed by: Personally   Additional Notes: H+P and labs reviewed, risks and benefits discussed with patient, procedure tolerated well without complications

## 2016-08-13 NOTE — Anesthesia Postprocedure Evaluation (Addendum)
Anesthesia Post Note  Patient: Terance Pomplun  Procedure(s) Performed: Procedure(s) (LRB): LEFT BELOW KNEE AMPUTATION (Left)  Patient location during evaluation: PACU Anesthesia Type: Regional Level of consciousness: awake and alert Pain management: pain level controlled Vital Signs Assessment: post-procedure vital signs reviewed and stable Respiratory status: spontaneous breathing, nonlabored ventilation, respiratory function stable and patient connected to nasal cannula oxygen Cardiovascular status: stable and blood pressure returned to baseline Anesthetic complications: no       Last Vitals:  Vitals:   08/12/16 2045 08/13/16 0445  BP: 108/71 95/65  Pulse: 78 77  Resp: 12 14  Temp: 37.2 C 37.5 C    Last Pain:  Vitals:   08/13/16 0918  TempSrc:   PainSc: 6                  Lisaann Atha

## 2016-08-13 NOTE — Progress Notes (Addendum)
PROGRESS NOTE   Todd [name]  YTK:[id number] DOB: 1956-03-30 DOA: 08/08/2016   PCP: [name], MD   Brief Narrative:  61 year old man admitted to the hospital from home on 2/26 due to "leg blisters". Thought to have gangrene of left leg and possible cellulitis of the right lower extremity. Admission requested for further evaluation and management.  Assessment & Plan:   Sepsis ruled in and in the setting of left lower extremity ischemic changes in the left foot extending to just above the ankle - Likely has peripheral vascular disease in the setting of complications from long-standing diabetes. - with gangrenous changes to the left foot worse than the right foot. - Ankle-brachial indices are ordered, right 0.29, left inaudible  - pt is now s/p Transtibial amputation and application of Prevena wound VAC, application of stump shrinker, post op day #1 (Dr. Sharol Given) - vascular surgery also following, pt has right SFA occlusion with reconstitution of below knee pop then severe tibial disease below this with no flow crossing the ankle, plan to schedule for a-gram and attempt SFA pop recanalization on Monday; if successful he is still VERY high risk of limb loss - leukocytosis is due to foot issues, improving, monitor and continue vancomycin and rocephin   Thrombocytosis - reactive, overall improving  - CBC In AM  Hypotension - keep on IVF for now  Hypokalemia - mild, supplement, BMP in AM  Type 2 diabetes with complications of PVD - improved with reduced lantus doses   Acute renal failure - resolved with IVF - BMP in AM  Hypothyroidism - Continue Synthroid   DVT prophylaxis: lovenox Code Status: full code Family Communication: mother at bedside updated on plan of care and all questions answered Disposition Plan: to be determined   Consultants:   Surgery  Vascular surgery   Antimicrobials:  Anti-infectives    Start     Dose/Rate Route Frequency Ordered Stop   08/12/16 0845  cefUROXime (ZINACEF) 1.5 g in dextrose 5 % 50 mL IVPB     1.5 g 100 mL/hr over 30 Minutes Intravenous On call to O.R. 08/12/16 0830 08/13/16 0559   08/09/16 1600  vancomycin (VANCOCIN) IVPB 1000 mg/200 mL premix     1,000 mg 200 mL/hr over 60 Minutes Intravenous Every 24 hours 08/08/16 2153     08/08/16 2200  cefTRIAXone (ROCEPHIN) 2 g in dextrose 5 % 50 mL IVPB     2 g 100 mL/hr over 30 Minutes Intravenous Every 24 hours 08/08/16 2056     08/08/16 2200  metroNIDAZOLE (FLAGYL) tablet 500 mg  Status:  Discontinued     500 mg Oral Every 8 hours 08/08/16 2056 08/09/16 1554   08/08/16 1615  vancomycin (VANCOCIN) IVPB 1000 mg/200 mL premix     1,000 mg 200 mL/hr over 60 Minutes Intravenous  Once 08/08/16 1610 08/08/16 1801   08/08/16 1615  piperacillin-tazobactam (ZOSYN) IVPB 3.375 g     3.375 g 100 mL/hr over 30 Minutes Intravenous  Once 08/08/16 1610 08/08/16 1720     Subjective: no complaints this morning  Objective: Vitals:   08/12/16 2014 08/12/16 2045 08/13/16 0445 08/13/16 1330  BP:  108/71 95/65 (!) 90/58  Pulse:  78 77 82  Resp:  12 14 15   Temp:  98.9 F (37.2 C) 99.5 F (37.5 C) 99.1 F (37.3 C)  TempSrc:  Oral Oral Oral  SpO2: 100% 100% 99% 100%  Weight:      Height:        Intake/Output Summary (  Last 24 hours) at 08/13/16 1508 Last data filed at 08/13/16 0531  Gross per 24 hour  Intake              500 ml  Output              635 ml  Net             -135 ml   Filed Weights   08/08/16 1600 08/08/16 2052 08/10/16 0600  Weight: 77.1 kg (170 lb) 70.9 kg (156 lb 4.8 oz) 69.7 kg (153 lb 9.6 oz)    Examination:  General exam: Alert, awake, oriented x 3 Respiratory system: Clear to auscultation. Respiratory effort normal. Cardiovascular system:RRR. No murmurs, rubs, gallops. Gastrointestinal system: Abdomen is nondistended, soft and nontender. No organomegaly or masses felt. Normal bowel sounds heard. Psychiatry: Judgement and insight appear  normal. Mood & affect appropriate.   Data Reviewed: I have personally reviewed following labs and imaging studies  CBC:  Recent Labs Lab 08/08/16 1617 08/08/16 1626 08/09/16 0523 08/10/16 0422 08/12/16 0332 08/13/16 0332  WBC 22.7*  --  22.9* 25.1* 23.1* 19.6*  NEUTROABS 17.7*  --   --   --   --   --   HGB 12.5* 14.3 11.4* 11.7* 11.3* 9.4*  HCT 36.3* 42.0 33.6* 34.5* 34.3* 28.9*  MCV 84.0  --  83.2 83.1 84.3 83.5  PLT 710*  --  722* 741* 598* 812*   Basic Metabolic Panel:  Recent Labs Lab 08/08/16 1617 08/08/16 1626 08/09/16 0523 08/10/16 0422 08/12/16 0332 08/13/16 0332  NA 133* 136 131* 133* 138 134*  K 3.6 3.7 4.0 3.8 3.3* 3.3*  CL 102 103 107 104 100* 101  CO2 20*  --  17* 19* 25 22  GLUCOSE 107* 105* 67 115* 64* 117*  BUN 33* 32* 25* 14 6 9   CREATININE 1.79* 1.80* 1.26* 1.03 1.12 1.07  CALCIUM 8.5*  --  7.8* 8.2* 8.0* 7.2*   Liver Function Tests:  Recent Labs Lab 08/08/16 1617  AST 50*  ALT 40  ALKPHOS 87  BILITOT 0.6  PROT 8.6*  ALBUMIN 2.4*   Coagulation Profile:  Recent Labs Lab 08/08/16 2057  INR 1.11   CBG:  Recent Labs Lab 08/12/16 1441 08/12/16 1641 08/12/16 2052 08/13/16 0540 08/13/16 1124  GLUCAP 91 88 115* 75 120*   Urine analysis:    Component Value Date/Time   COLORURINE YELLOW 08/08/2016 1610   APPEARANCEUR CLEAR 08/08/2016 1610   APPEARANCEUR Clear 08/05/2016 1630   LABSPEC 1.011 08/08/2016 1610   PHURINE 5.0 08/08/2016 1610   GLUCOSEU NEGATIVE 08/08/2016 1610   HGBUR NEGATIVE 08/08/2016 1610   BILIRUBINUR NEGATIVE 08/08/2016 1610   BILIRUBINUR Negative 08/05/2016 1630   KETONESUR NEGATIVE 08/08/2016 1610   PROTEINUR NEGATIVE 08/08/2016 1610   NITRITE NEGATIVE 08/08/2016 1610   LEUKOCYTESUR NEGATIVE 08/08/2016 1610   LEUKOCYTESUR Negative 08/05/2016 1630    Recent Results (from the past 240 hour(s))  Microscopic Examination     Status: None   Collection Time: 08/05/16  4:30 PM  Result Value Ref Range Status    WBC, UA None seen 0 - 5 /hpf Final   RBC, UA 0-2 0 - 2 /hpf Final   Epithelial Cells (non renal) None seen 0 - 10 /hpf Final   Renal Epithel, UA None seen None seen /hpf Final   Mucus, UA Present Not Estab. Final   Bacteria, UA None seen None seen/Few Final  Blood Culture (routine x 2)  Status: None   Collection Time: 08/08/16  4:31 PM  Result Value Ref Range Status   Specimen Description BLOOD RIGHT HAND  Final   Special Requests BOTTLES DRAWN AEROBIC AND ANAEROBIC [place]  Final   Culture NO GROWTH 5 DAYS  Final   Report Status 08/13/2016 FINAL  Final  Blood Culture (routine x 2)     Status: None   Collection Time: 08/08/16  4:39 PM  Result Value Ref Range Status   Specimen Description BLOOD RIGHT HAND  Final   Special Requests BOTTLES DRAWN AEROBIC ONLY [place] AEB  Final   Culture NO GROWTH 5 DAYS  Final   Report Status 08/13/2016 FINAL  Final    Radiology Studies: No results found. Scheduled Meds: . aspirin EC  325 mg Oral Daily  . carvedilol  3.125 mg Oral BID WC  . cefTRIAXone (ROCEPHIN)  IV  2 g Intravenous Q24H  . colchicine  0.6 mg Oral BID  . docusate sodium  100 mg Oral BID  . enoxaparin (LOVENOX) injection  40 mg Subcutaneous Q24H  . feeding supplement (GLUCERNA SHAKE)  237 mL Oral BID BM  . insulin aspart  0-15 Units Subcutaneous TID WC  . insulin glargine  20 Units Subcutaneous Daily  . levothyroxine  100 mcg Oral QAC breakfast  . pregabalin  200 mg Oral BID  . vancomycin  1,000 mg Intravenous Q24H   Continuous Infusions: . sodium chloride 10 mL/hr at 08/12/16 2030  . lactated ringers 125 mL/hr at 08/11/16 0624     LOS: 5 days   Time spent: 30 minutes. Greater than 50% of this time was spent in direct contact with the patient coordinating care.  Faye Ramsay, MD [facility] Pager [phone]  If 7PM-7AM, please contact night-coverage [email or web address] Password [email or web address] 08/13/2016, 3:08 PM

## 2016-08-13 NOTE — Plan of Care (Signed)
Problem: Physical Regulation: Goal: Will remain free from infection Outcome: Progressing Dressing placed on blistered right foot today

## 2016-08-13 NOTE — Addendum Note (Signed)
Addendum  created 08/13/16 1217 by Oleta Mouse, MD   Anesthesia Intra Blocks edited, Anesthesia Intra Meds edited, Child order released for a procedure order, Pend clinical note, Sign clinical note

## 2016-08-13 NOTE — Evaluation (Signed)
Physical Therapy Evaluation Patient Details Name: Todd [name] MRN: [id number] DOB: [birth year] Today's Date: 08/13/2016   History of Present Illness  61 yo admitted with RLE cellulitis and LLE gangrene s/p Lt BKA. PMHx: DM, CKD, HTN, gout  Clinical Impression  Pt pleasant with decreased processing and problem solving. Pt with condom cath off, bed soaked and pt unaware on arrival. Pt with difficulty with all transfers and unable to stand today with assist and facilitation. Pt with assist of mom at home and unsure how much physical assist she can provide at D/C. Pt with decreased mobility, strength, function, balance and unable to ambulate at this time who will benefit from acute therapy to maximize mobility, balance, and strength to decrease burden of care and improve quality of life.     Follow Up Recommendations CIR;Supervision/Assistance - 24 hour    Equipment Recommendations  3in1 (PT)    Recommendations for Other Services OT consult;Rehab consult     Precautions / Restrictions Precautions Precautions: Fall Restrictions LLE Weight Bearing: Non weight bearing      Mobility  Bed Mobility Overal bed mobility: Needs Assistance Bed Mobility: Rolling;Sidelying to Sit Rolling: Min guard Sidelying to sit: Min assist       General bed mobility comments: cues for sequence with increased time, reliance on rail and use of pad to assist reciprocal scooting  Transfers Overall transfer level: Needs assistance   Transfers: Squat Pivot Transfers;Sit to/from Stand Sit to Stand: Max assist;From elevated surface   Squat pivot transfers: Max assist     General transfer comment: attempted sit to stand from bed x 2 with bed elevated, knee blocked and multimodal cues with pt having difficulty with anterior translation and unable to rise. Transitioned to squat pivot with over the back technique with max cues for pivot to right bed to chair. NT present and educated for  transfer  Ambulation/Gait             General Gait Details: unable  Stairs            Wheelchair Mobility    Modified Rankin (Stroke Patients Only)       Balance Overall balance assessment: Needs assistance   Sitting balance-Leahy Scale: Fair       Standing balance-Leahy Scale: Poor                               Pertinent Vitals/Pain Pain Assessment: 0-10 Pain Score: 6  Pain Location: LLE Pain Descriptors / Indicators: Aching;Sore Pain Intervention(s): Limited activity within patient's tolerance;Repositioned;Monitored during session;Patient requesting pain meds-RN notified    Home Living Family/patient expects to be discharged to:: Private residence Living Arrangements: Parent Available Help at Discharge: Family;Available PRN/intermittently Type of Home: House Home Access: Stairs to enter   Entrance Stairs-Number of Steps: 1 Home Layout: Laundry or work area in basement;Two level Home Equipment: Walker - 2 wheels;Wheelchair - manual      Prior Function Level of Independence: Independent with assistive device(s)         Comments: pt states he was using RW PTA but able to care for himself until recently when mom has been assiting with getting OOB     Hand Dominance        Extremity/Trunk Assessment   Upper Extremity Assessment Upper Extremity Assessment: Overall WFL for tasks assessed    Lower Extremity Assessment Lower Extremity Assessment: Generalized weakness    Cervical / Trunk Assessment Cervical / Trunk  Assessment: Kyphotic  Communication   Communication: No difficulties  Cognition Arousal/Alertness: Awake/alert Behavior During Therapy: WFL for tasks assessed/performed Overall Cognitive Status: Impaired/Different from baseline Area of Impairment: Memory     Memory: Decreased short-term memory         General Comments: difficulty with providing home setup at times and PLOF    General Comments       Exercises Amputee Exercises Quad Sets: AROM;Left;Supine;10 reps Hip Extension: AAROM;Left;Sidelying;10 reps Hip ABduction/ADduction: AROM;Left;Supine;10 reps   Assessment/Plan    PT Assessment Patient needs continued PT services  PT Problem List Decreased strength;Decreased mobility;Decreased safety awareness;Decreased activity tolerance;Decreased cognition;Decreased balance;Decreased knowledge of use of DME;Pain;Impaired sensation       PT Treatment Interventions Gait training;Therapeutic exercise;Patient/family education;DME instruction;Therapeutic activities;Cognitive remediation;Balance training;Functional mobility training;Wheelchair mobility training;Neuromuscular re-education    PT Goals (Current goals can be found in the Care Plan section)  Acute Rehab PT Goals Patient Stated Goal: be able to walk PT Goal Formulation: With patient Time For Goal Achievement: 08/27/16 Potential to Achieve Goals: Fair    Frequency Min 3X/week   Barriers to discharge Decreased caregiver support      Co-evaluation               End of Session Equipment Utilized During Treatment: Gait belt Activity Tolerance: Patient tolerated treatment well Patient left: in chair;with call bell/phone within reach;with nursing/sitter in room;with chair alarm set Nurse Communication: Mobility status;Precautions;Weight bearing status PT Visit Diagnosis: Muscle weakness (generalized) (M62.81);Other abnormalities of gait and mobility (R26.89);Pain Pain - Right/Left: Left Pain - part of body: Leg         Time: 3810-1751 PT Time Calculation (min) (ACUTE ONLY): 31 min   Charges:   PT Evaluation $PT Eval Moderate Complexity: 1 Procedure PT Treatments $Therapeutic Activity: 8-22 mins   PT G Codes:         Lauralynn Loeb B Takara Sermons 20-Aug-2016, 10:11 AM  Elwyn Reach, [place]

## 2016-08-13 NOTE — Progress Notes (Signed)
[facility]  Subjective  - some pain in right foot left Bka   Objective 95/65 77 99.5 F (37.5 C) (Oral) 14 99%  Intake/Output Summary (Last 24 hours) at 08/13/16 3794 Last data filed at 08/13/16 0531  Gross per 24 hour  Intake              500 ml  Output              885 ml  Net             -385 ml   Left BKA no significant edema Right foot cool with 2 dorsal surface blisters  Assessment/Planning: Wound care xeroform gauze to right foot I reviewed patient's arteriogram.  He has right SFA occlusion with reconstitution of below knee pop then severe tibial disease below this with no flow crossing the ankle.  This may be due to underfilling but I suspect he has similar pattern of disease to left leg.    Will schedule for agram and attempt SFA pop recanalization on Monday; however, if successful he is still VERY high risk of limb loss  Leukocytosis most likely secondary to foot. Trend for now.  Ruta Hinds 08/13/2016 9:37 AM --  Laboratory Lab Results:  Recent Labs  08/12/16 0332 08/13/16 0332  WBC 23.1* 19.6*  HGB 11.3* 9.4*  HCT 34.3* 28.9*  PLT 598* 572*   BMET  Recent Labs  08/12/16 0332 08/13/16 0332  NA 138 134*  K 3.3* 3.3*  CL 100* 101  CO2 25 22  GLUCOSE 64* 117*  BUN 6 9  CREATININE 1.12 1.07  CALCIUM 8.0* 7.2*    COAG Lab Results  Component Value Date   INR 1.11 08/08/2016   No results found for: PTT

## 2016-08-13 NOTE — Progress Notes (Signed)
Rehab Admissions Coordinator Note:  Patient was screened by Cleatrice Burke for appropriateness for an Inpatient Acute Rehab Consult per PT recommendation.  At this time, we are recommending await further medical workup before requesting an inpt rehab consult.. Noted plans for Monday.  Cleatrice Burke 08/13/2016, 12:08 PM  I can be reached at [phone].

## 2016-08-13 NOTE — Anesthesia Procedure Notes (Signed)
Anesthesia Regional Block: Femoral nerve block   Pre-Anesthetic Checklist: ,, timeout performed, Correct Patient, Correct Site, Correct Laterality, Correct Procedure, Correct Position, site marked, Risks and benefits discussed,  Surgical consent,  Pre-op evaluation,  At surgeon's request and post-op pain management  Laterality: Lower and Left  Prep: chloraprep       Needles:  Injection technique: Single-shot  Needle Type: Echogenic Stimulator Needle          Additional Needles:   Procedures: ultrasound guided, nerve stimulator,,,,,,   Nerve Stimulator or Paresthesia:  Response: quad, 0.5 mA,   Additional Responses:   Narrative:  Start time: 08/12/2016 3:01 PM End time: 08/12/2016 3:03 PM Injection made incrementally with aspirations every 5 mL.  Performed by: Personally  Anesthesiologist: Jamella Grayer  Additional Notes: H+P and labs reviewed, risks and benefits discussed with patient, procedure tolerated well without complications

## 2016-08-14 ENCOUNTER — Encounter (HOSPITAL_COMMUNITY): Payer: Self-pay | Admitting: Orthopedic Surgery

## 2016-08-14 LAB — BASIC METABOLIC PANEL
Anion gap: 8 (ref 5–15)
BUN: 10 mg/dL (ref 6–20)
CHLORIDE: 104 mmol/L (ref 101–111)
CO2: 25 mmol/L (ref 22–32)
Calcium: 7.2 mg/dL — ABNORMAL LOW (ref 8.9–10.3)
Creatinine, Ser: 1.17 mg/dL (ref 0.61–1.24)
GFR calc Af Amer: >60 mL/min (ref >60)
GFR calc non Af Amer: >60 mL/min (ref >60)
Glucose, Bld: 52 mg/dL — ABNORMAL LOW (ref 65–99)
POTASSIUM: 3.7 mmol/L (ref 3.5–5.1)
Sodium: 137 mmol/L (ref 135–145)

## 2016-08-14 LAB — CBC
HEMATOCRIT: 31.2 % — AB (ref 39.0–52.0)
HEMOGLOBIN: 10.1 g/dL — AB (ref 13.0–17.0)
MCH: 27.4 pg (ref 26.0–34.0)
MCHC: 32.4 g/dL (ref 30.0–36.0)
MCV: 84.6 fL (ref 78.0–100.0)
Platelets: 616 10*3/uL — ABNORMAL HIGH (ref 150–400)
RBC: 3.69 MIL/uL — ABNORMAL LOW (ref 4.22–5.81)
RDW: 14 % (ref 11.5–15.5)
WBC: 22.5 10*3/uL — ABNORMAL HIGH (ref 4.0–10.5)

## 2016-08-14 LAB — GLUCOSE, CAPILLARY
GLUCOSE-CAPILLARY: 116 mg/dL — AB (ref 65–99)
Glucose-Capillary: 108 mg/dL — ABNORMAL HIGH (ref 65–99)
Glucose-Capillary: 135 mg/dL — ABNORMAL HIGH (ref 65–99)
Glucose-Capillary: 101 mg/dL — ABNORMAL HIGH (ref 65–99)

## 2016-08-14 MED ORDER — VANCOMYCIN HCL IN DEXTROSE 750-5 MG/150ML-% IV SOLN
750.0000 mg | Freq: Two times a day (BID) | INTRAVENOUS | Status: DC
  Administered 2016-08-14 – 2016-08-16 (×5): 750 mg via INTRAVENOUS
  Filled 2016-08-14 (×5): qty 150

## 2016-08-14 NOTE — Progress Notes (Signed)
PROGRESS NOTE   Todd [name]  XBL:[id number] DOB: [birth year] DOA: 08/08/2016   PCP: [name], MD   Brief Narrative:  61 year old man admitted to the hospital from home on 2/26 due to "leg blisters". Thought to have gangrene of left leg and possible cellulitis of the right lower extremity. Admission requested for further evaluation and management.  Assessment & Plan:   Sepsis ruled in and in the setting of left lower extremity ischemic changes in the left foot extending to just above the ankle - Likely has peripheral vascular disease in the setting of complications from long-standing diabetes. - with gangrenous changes to the left foot worse than the right foot. - Ankle-brachial indices are ordered, right 0.29, left inaudible  - pt is now s/p Transtibial amputation and application of Prevena wound VAC, application of stump shrinker, post op day #1 (Dr. Sharol Given) - vascular surgery also following, pt has right SFA occlusion with reconstitution of below knee pop then severe tibial disease below this with no flow crossing the ankle, plan to schedule for a-gram and attempt SFA pop recanalization on Monday; if successful he is still VERY high risk of limb loss - leukocytosis is due to foot issues, continue vancomycin and rocephin   Thrombocytosis - reactive, up this am from yesterday  - CBC In AM  Hypotension - keep on IVF for now  Hypokalemia - supplemented and WNL this AM  Type 2 diabetes with complications of PVD - improved with reduced lantus doses   Acute renal failure - resolved with IVF - BMP in AM  Hypothyroidism - Continue Synthroid   DVT prophylaxis: lovenox Code Status: full code Family Communication: no family at bedside  Disposition Plan: to be determined   Consultants:   Surgery  Vascular surgery   Antimicrobials:  Anti-infectives    Start     Dose/Rate Route Frequency Ordered Stop   08/14/16 1300  vancomycin (VANCOCIN) IVPB 750 mg/150 ml premix     750 mg 150 mL/hr over 60 Minutes Intravenous Every 12 hours 08/14/16 1154     08/12/16 0845  cefUROXime (ZINACEF) 1.5 g in dextrose 5 % 50 mL IVPB     1.5 g 100 mL/hr over 30 Minutes Intravenous On call to O.R. 08/12/16 0830 08/13/16 0559   08/09/16 1600  vancomycin (VANCOCIN) IVPB 1000 mg/200 mL premix  Status:  Discontinued     1,000 mg 200 mL/hr over 60 Minutes Intravenous Every 24 hours 08/08/16 2153 08/14/16 1154   08/08/16 2200  cefTRIAXone (ROCEPHIN) 2 g in dextrose 5 % 50 mL IVPB     2 g 100 mL/hr over 30 Minutes Intravenous Every 24 hours 08/08/16 2056     08/08/16 2200  metroNIDAZOLE (FLAGYL) tablet 500 mg  Status:  Discontinued     500 mg Oral Every 8 hours 08/08/16 2056 08/09/16 1554   08/08/16 1615  vancomycin (VANCOCIN) IVPB 1000 mg/200 mL premix     1,000 mg 200 mL/hr over 60 Minutes Intravenous  Once 08/08/16 1610 08/08/16 1801   08/08/16 1615  piperacillin-tazobactam (ZOSYN) IVPB 3.375 g     3.375 g 100 mL/hr over 30 Minutes Intravenous  Once 08/08/16 1610 08/08/16 1720     Subjective: no complaints this morning  Objective: Vitals:   08/13/16 1759 08/13/16 2047 08/14/16 0422 08/14/16 1300  BP: 126/66 111/65 112/67 100/72  Pulse: 93 81 78 84  Resp:  18 18 20   Temp:  99.5 F (37.5 C) 97.7 F (36.5 C)   TempSrc:  Oral Oral  SpO2:  96% 97% 97%  Weight:      Height:        Intake/Output Summary (Last 24 hours) at 08/14/16 1446 Last data filed at 08/14/16 1230  Gross per 24 hour  Intake              480 ml  Output                0 ml  Net              480 ml   Filed Weights   08/08/16 1600 08/08/16 2052 08/10/16 0600  Weight: 77.1 kg (170 lb) 70.9 kg (156 lb 4.8 oz) 69.7 kg (153 lb 9.6 oz)    Examination:  General exam: Alert, awake, oriented x 3 Respiratory system: Clear to auscultation. Respiratory effort normal. Cardiovascular system:RRR. No murmurs, rubs, gallops. Gastrointestinal system: Abdomen is nondistended, soft and nontender. No  organomegaly or masses felt. Normal bowel sounds heard. Psychiatry: Judgement and insight appear normal. Mood & affect appropriate.   Data Reviewed: I have personally reviewed following labs and imaging studies  CBC:  Recent Labs Lab 08/08/16 1617  08/09/16 0523 08/10/16 0422 08/12/16 0332 08/13/16 0332 08/14/16 0244  WBC 22.7*  --  22.9* 25.1* 23.1* 19.6* 22.5*  NEUTROABS 17.7*  --   --   --   --   --   --   HGB 12.5*  < > 11.4* 11.7* 11.3* 9.4* 10.1*  HCT 36.3*  < > 33.6* 34.5* 34.3* 28.9* 31.2*  MCV 84.0  --  83.2 83.1 84.3 83.5 84.6  PLT 710*  --  722* 741* 598* 572* 616*  < > = values in this interval not displayed. Basic Metabolic Panel:  Recent Labs Lab 08/09/16 0523 08/10/16 0422 08/12/16 0332 08/13/16 0332 08/14/16 0244  NA 131* 133* 138 134* 137  K 4.0 3.8 3.3* 3.3* 3.7  CL 107 104 100* 101 104  CO2 17* 19* 25 22 25   GLUCOSE 67 115* 64* 117* 52*  BUN 25* 14 6 9 10   CREATININE 1.26* 1.03 1.12 1.07 1.17  CALCIUM 7.8* 8.2* 8.0* 7.2* 7.2*   Liver Function Tests:  Recent Labs Lab 08/08/16 1617  AST 50*  ALT 40  ALKPHOS 87  BILITOT 0.6  PROT 8.6*  ALBUMIN 2.4*   Coagulation Profile:  Recent Labs Lab 08/08/16 2057  INR 1.11   CBG:  Recent Labs Lab 08/13/16 1124 08/13/16 1844 08/13/16 2045 08/14/16 0621 08/14/16 1108  GLUCAP 120* 119* 93 108* 116*   Urine analysis:    Component Value Date/Time   COLORURINE YELLOW 08/08/2016 [place] 08/08/2016 1610   APPEARANCEUR Clear 08/05/2016 1630   LABSPEC 1.011 08/08/2016 1610   PHURINE 5.0 08/08/2016 1610   GLUCOSEU NEGATIVE 08/08/2016 1610   HGBUR NEGATIVE 08/08/2016 1610   BILIRUBINUR NEGATIVE 08/08/2016 1610   BILIRUBINUR Negative 08/05/2016 [place] 08/08/2016 1610   PROTEINUR NEGATIVE 08/08/2016 1610   NITRITE NEGATIVE 08/08/2016 1610   LEUKOCYTESUR NEGATIVE 08/08/2016 1610   LEUKOCYTESUR Negative 08/05/2016 1630    Recent Results (from the past  240 hour(s))  Microscopic Examination     Status: None   Collection Time: 08/05/16  4:30 PM  Result Value Ref Range Status   WBC, UA None seen 0 - 5 /hpf Final   RBC, UA 0-2 0 - 2 /hpf Final   Epithelial Cells (non renal) None seen 0 - 10 /hpf Final   Renal Epithel, UA  None seen None seen /hpf Final   Mucus, UA Present Not Estab. Final   Bacteria, UA None seen None seen/Few Final  Blood Culture (routine x 2)     Status: None   Collection Time: 08/08/16  4:31 PM  Result Value Ref Range Status   Specimen Description BLOOD RIGHT HAND  Final   Special Requests BOTTLES DRAWN AEROBIC AND ANAEROBIC [place]  Final   Culture NO GROWTH 5 DAYS  Final   Report Status 08/13/2016 FINAL  Final  Blood Culture (routine x 2)     Status: None   Collection Time: 08/08/16  4:39 PM  Result Value Ref Range Status   Specimen Description BLOOD RIGHT HAND  Final   Special Requests BOTTLES DRAWN AEROBIC ONLY [place] AEB  Final   Culture NO GROWTH 5 DAYS  Final   Report Status 08/13/2016 FINAL  Final    Radiology Studies: No results found. Scheduled Meds: . aspirin EC  325 mg Oral Daily  . carvedilol  3.125 mg Oral BID WC  . cefTRIAXone (ROCEPHIN)  IV  2 g Intravenous Q24H  . colchicine  0.6 mg Oral BID  . docusate sodium  100 mg Oral BID  . enoxaparin (LOVENOX) injection  40 mg Subcutaneous Q24H  . feeding supplement (GLUCERNA SHAKE)  237 mL Oral BID BM  . insulin aspart  0-15 Units Subcutaneous TID WC  . insulin glargine  20 Units Subcutaneous Daily  . levothyroxine  100 mcg Oral QAC breakfast  . pregabalin  200 mg Oral BID  . vancomycin  750 mg Intravenous Q12H   Continuous Infusions: . sodium chloride 10 mL/hr at 08/12/16 2030  . lactated ringers 125 mL/hr at 08/11/16 0624    LOS: 6 days   Time spent: 30 minutes. Greater than 50% of this time was spent in direct contact with the patient coordinating care.  Faye Ramsay, MD [facility] Pager [phone]  If 7PM-7AM, please  contact night-coverage [email or web address] Password [email or web address] 08/14/2016, 2:46 PM

## 2016-08-14 NOTE — Consult Note (Addendum)
[facility]  Subjective  - right foot feels a little better   Objective 100/72 84 97.7 F (36.5 C) (Oral) 20 97%  Intake/Output Summary (Last 24 hours) at 08/14/16 1520 Last data filed at 08/14/16 1230  Gross per 24 hour  Intake              480 ml  Output                0 ml  Net              480 ml   Right foot blister unchanged Left BKA VAC in place  Assessment/Planning: Discussed with pt arteriogram and possible intervention tomorrow.  Informed him of poor runoff situation and even if we are able to fix his SFA he is still very high risk for limb loss right foot maybe even during this admission.  He will talk with his mother later today  Plan will be for arteriogram tomorrow unless pt instead opts for right BKA/observation  Continue xeroform daily right foot  NPO p midnight, consent  Ruta Hinds 08/14/2016 3:20 PM --  Laboratory Lab Results:  Recent Labs  08/13/16 0332 08/14/16 0244  WBC 19.6* 22.5*  HGB 9.4* 10.1*  HCT 28.9* 31.2*  PLT 572* 616*   BMET  Recent Labs  08/13/16 0332 08/14/16 0244  NA 134* 137  K 3.3* 3.7  CL 101 104  CO2 22 25  GLUCOSE 117* 52*  BUN 9 10  CREATININE 1.07 1.17  CALCIUM 7.2* 7.2*    COAG Lab Results  Component Value Date   INR 1.11 08/08/2016   No results found for: PTT

## 2016-08-15 ENCOUNTER — Encounter (HOSPITAL_COMMUNITY)
Admission: EM | Disposition: A | Discharge status: Skilled Nursing Facility | Payer: Self-pay | Source: Home / Self Care | Attending: Internal Medicine

## 2016-08-15 DIAGNOSIS — L8990 Pressure ulcer of unspecified site, unspecified stage: Secondary | ICD-10-CM | POA: Insufficient documentation

## 2016-08-15 HISTORY — PX: ABDOMINAL AORTOGRAM W/LOWER EXTREMITY: CATH118223

## 2016-08-15 LAB — BASIC METABOLIC PANEL
ANION GAP: 9 (ref 5–15)
BUN: 11 mg/dL (ref 6–20)
CHLORIDE: 103 mmol/L (ref 101–111)
CO2: 22 mmol/L (ref 22–32)
Calcium: 7.2 mg/dL — ABNORMAL LOW (ref 8.9–10.3)
Creatinine, Ser: 1.18 mg/dL (ref 0.61–1.24)
Glucose, Bld: 81 mg/dL (ref 65–99)
POTASSIUM: 3.9 mmol/L (ref 3.5–5.1)
SODIUM: 134 mmol/L — AB (ref 135–145)

## 2016-08-15 LAB — CBC
HEMATOCRIT: 30.1 % — AB (ref 39.0–52.0)
HEMOGLOBIN: 9.9 g/dL — AB (ref 13.0–17.0)
MCH: 27.9 pg (ref 26.0–34.0)
MCHC: 32.9 g/dL (ref 30.0–36.0)
MCV: 84.8 fL (ref 78.0–100.0)
Platelets: 580 10*3/uL — ABNORMAL HIGH (ref 150–400)
RBC: 3.55 MIL/uL — AB (ref 4.22–5.81)
RDW: 14.3 % (ref 11.5–15.5)
WBC: 23 10*3/uL — AB (ref 4.0–10.5)

## 2016-08-15 LAB — GLUCOSE, CAPILLARY
GLUCOSE-CAPILLARY: 89 mg/dL (ref 65–99)
Glucose-Capillary: 117 mg/dL — ABNORMAL HIGH (ref 65–99)
Glucose-Capillary: 127 mg/dL — ABNORMAL HIGH (ref 65–99)
Glucose-Capillary: 104 mg/dL — ABNORMAL HIGH (ref 65–99)

## 2016-08-15 SURGERY — ABDOMINAL AORTOGRAM W/LOWER EXTREMITY
Anesthesia: LOCAL

## 2016-08-15 MED ORDER — LIDOCAINE HCL (PF) 1 % IJ SOLN
INTRAMUSCULAR | Status: AC
  Filled 2016-08-15: qty 30

## 2016-08-15 MED ORDER — HYDRALAZINE HCL 20 MG/ML IJ SOLN: 5.0000 mg | INTRAMUSCULAR | Status: DC | PRN

## 2016-08-15 MED ORDER — SODIUM CHLORIDE 0.45 % IV SOLN
INTRAVENOUS | Status: DC
  Administered 2016-08-15: 12:00:00 via INTRAVENOUS

## 2016-08-15 MED ORDER — LIDOCAINE HCL (PF) 1 % IJ SOLN
INTRAMUSCULAR | Status: DC | PRN
  Administered 2016-08-15: 20 mL via INTRADERMAL

## 2016-08-15 MED ORDER — HEPARIN (PORCINE) IN NACL 2-0.9 UNIT/ML-% IJ SOLN
INTRAMUSCULAR | Status: DC | PRN
  Administered 2016-08-15: 1000 mL

## 2016-08-15 MED ORDER — HEPARIN (PORCINE) IN NACL 2-0.9 UNIT/ML-% IJ SOLN
INTRAMUSCULAR | Status: AC
  Filled 2016-08-15: qty 1000

## 2016-08-15 MED ORDER — INSULIN GLARGINE 100 UNIT/ML ~~LOC~~ SOLN
10.0000 [IU] | Freq: Every day | SUBCUTANEOUS | Status: DC
  Administered 2016-08-18 – 2016-08-19 (×2): 10 [IU] via SUBCUTANEOUS
  Filled 2016-08-15 (×4): qty 0.1

## 2016-08-15 MED ORDER — IODIXANOL 320 MG/ML IV SOLN
INTRAVENOUS | Status: DC | PRN
  Administered 2016-08-15: 80 mL via INTRA_ARTERIAL

## 2016-08-15 MED ORDER — LABETALOL HCL 5 MG/ML IV SOLN: 10.0000 mg | INTRAVENOUS | Status: DC | PRN

## 2016-08-15 MED ORDER — GERHARDT'S BUTT CREAM
TOPICAL_CREAM | Freq: Four times a day (QID) | CUTANEOUS | Status: DC
  Administered 2016-08-15 (×4): via TOPICAL
  Administered 2016-08-15: 1 via TOPICAL
  Administered 2016-08-16 – 2016-08-17 (×6): via TOPICAL
  Filled 2016-08-15: qty 1

## 2016-08-15 MED ORDER — ONDANSETRON HCL 4 MG/2ML IJ SOLN: 4.0000 mg | Freq: Four times a day (QID) | INTRAMUSCULAR | Status: DC | PRN

## 2016-08-15 MED ORDER — METOPROLOL TARTRATE 5 MG/5ML IV SOLN: 2.0000 mg | INTRAVENOUS | Status: DC | PRN

## 2016-08-15 SURGICAL SUPPLY — 8 items
CATH CROSS OVER TEMPO 5F (CATHETERS) ×1 IMPLANT
CATH STRAIGHT 5FR 65CM (CATHETERS) ×1 IMPLANT
KIT PV (KITS) ×2 IMPLANT
SHEATH PINNACLE 5F 10CM (SHEATH) ×1 IMPLANT
SYR MEDRAD MARK V 150ML (SYRINGE) ×2 IMPLANT
TRANSDUCER W/STOPCOCK (MISCELLANEOUS) ×2 IMPLANT
TRAY PV CATH (CUSTOM PROCEDURE TRAY) ×2 IMPLANT
WIRE HITORQ VERSACORE ST 145CM (WIRE) ×1 IMPLANT

## 2016-08-15 NOTE — H&P (View-Only) (Signed)
[facility] [place]  Subjective  - right foot feels a little better   Objective 100/72 84 97.7 F (36.5 C) (Oral) 20 97%  Intake/Output Summary (Last 24 hours) at 08/14/16 1520 Last data filed at 08/14/16 1230  Gross per 24 hour  Intake              480 ml  Output                0 ml  Net              480 ml   Right foot blister unchanged Left BKA VAC in place  Assessment/Planning: Discussed with pt arteriogram and possible intervention tomorrow.  Informed him of poor runoff situation and even if we are able to fix his SFA he is still very high risk for limb loss right foot maybe even during this admission.  He will talk with his mother later today  Plan will be for arteriogram tomorrow unless pt instead opts for right BKA/observation  Continue xeroform daily right foot  NPO p midnight, consent  Ruta Hinds 08/14/2016 3:20 PM --  Laboratory Lab Results:  Recent Labs  08/13/16 0332 08/14/16 0244  WBC 19.6* 22.5*  HGB 9.4* 10.1*  HCT 28.9* 31.2*  PLT 572* 616*   BMET  Recent Labs  08/13/16 0332 08/14/16 0244  NA 134* 137  K 3.3* 3.7  CL 101 104  CO2 22 25  GLUCOSE 117* 52*  BUN 9 10  CREATININE 1.07 1.17  CALCIUM 7.2* 7.2*    COAG Lab Results  Component Value Date   INR 1.11 08/08/2016   No results found for: PTT

## 2016-08-15 NOTE — Op Note (Addendum)
Procedure: Right lower extremity arteriogram  Preoperative diagnosis: Gangrene right foot. Postoperative diagnosis: Same  Anesthesia: Local  Operative findings: Severe common femoral superficial femoral tibial artery occlusive disease unreconstructable  Operative details: After obtaining informed consent, the patient was taken to the [place] lab. The patient was placed in supine position Angio table. Both groins prepped and draped in usual sterile fashion. Local anesthesia was all treated of the left common femoral artery. Ultrasound was used to identify the left common femoral artery and an introducer needle was used to cannulate the left common femoral artery under ultrasound guidance. An 035 versacore hours and threaded up the abdominal aorta under fluoroscopic guidance. A 5 French sheath was then placed over the guidewire into the left common femoral artery and this was thoroughly flushed with heparinized saline. A 5 French crossover catheter was then advanced over the guidewire and this was used to selectively catheterize first the right common iliac followed by the external iliac artery. Crossover catheter was then exchanged for a 5 French straight catheter. A right lower extremity arteriogram was then performed. The right common femoral artery is patent but has a 50% stenosis. The profunda femoris is patent proximally but is diseased distally with a 70% stenosis. The right superficial femoral artery is patent proximally but occludes in the mid leg. There is an [place] of above-knee popliteal artery reconstitutes via collaterals. The posterior tibial and peroneal arteries are occluded. There is reconstitution of the proximal anterior tibial artery via collaterals. However this has multiple segments of subtotal occlusion. The dorsalis pedis occludes in the proximal foot just as it crosses the ankle. There is no known or seen vessel within the foot itself. At this point the 5 French straight catheter was  removed. The 5 French sheath was removed and hemostasis obtained with direct pressure. The patient tolerated the procedure well and there were no complications.  The patient was taken to the holding area in stable condition.  Operative management: The patient has unreconstructable arterial occlusive disease right lower extremity. I discussed with the patient today the possibility of a right below-knee amputation versus continued conservative management with wound care. He will consider his options. He will be back to [place] regarding what treatment plan he would prefer.  Ruta Hinds, MD [facility] [place] Office: [phone] Pager: [phone]

## 2016-08-15 NOTE — Progress Notes (Signed)
Pharmacy Antibiotic Note  Todd [name] is a 61 y.o. male admitted on 08/08/2016 to [facility] with leg blisters, cellulitis L>R, gangrene left foot.  Transferred to [facility] on 08/10/16. S/p left BKA on 08/12/16. S/p arteriogram today, considering right BKA vs conservative management with wound care.  Day # 8 Vancomycin dosing for diabetic foot infection, high risk for MRSA.  Also on day # 8 Ceftriaxone.    Plan:  Continue Vancomycin 750 mg IV q12hrs.  Will plan to check Vanc trough level in the next few days.  Target troughs 15-20 mcg/ml.  Follow renal function, progress, and plans.  Height: 5\' 8"  (172.7 cm) Weight: 153 lb 9.6 oz (69.7 kg) IBW/kg (Calculated) : 68.4  Temp (24hrs), Avg:98.3 F (36.8 C), Min:97.9 F (36.6 C), Max:98.7 F (37.1 C)   Recent Labs Lab 08/08/16 1631 08/08/16 1952 08/08/16 2057 08/09/16 0004  08/10/16 0422 08/12/16 0332 08/13/16 0332 08/14/16 0244 08/15/16 0423  WBC  --   --   --   --   < > 25.1* 23.1* 19.6* 22.5* 23.0*  CREATININE  --   --   --   --   < > 1.03 1.12 1.07 1.17 1.18  LATICACIDVEN 1.77 1.32 1.6 1.2  --   --   --   --   --   --   < > = values in this interval not displayed.  Estimated Creatinine Clearance: 63.6 mL/min (by C-G formula based on SCr of 1.18 mg/dL).    No Known Allergies  Antimicrobials this admission: Vancomycin 2/26 >> (none charted 3/1 or 3/2) Rocephin 2/26 >>  Flagyl 2/26>>2/27 Zosyn 3.375gm IV x 1 2/26 Cefuroxime 3/2 in OR with L BKA  Dose adjustments this admission:  Vanc 1gm IV q24hrs 2/26>>3/3  Vanc 750 mg IV q12h since 2pm on 3/4>>  Microbiology results: 2/26 blood x 2 - negative  Thank you for allowing pharmacy to be a part of this patient's care.  Arty Baumgartner, [place] Pager: [phone] 08/15/2016 2:44 PM

## 2016-08-15 NOTE — Interval H&P Note (Signed)
History and Physical Interval Note:  08/15/2016 10:36 AM  Todd [name]  has presented today for surgery, with the diagnosis of PVD  The various methods of treatment have been discussed with the patient and family. After consideration of risks, benefits and other options for treatment, the patient has consented to  Procedure(s): Abdominal Aortogram w/Lower Extremity (N/A) as a surgical intervention .  The patient's history has been reviewed, patient examined, no change in status, stable for surgery.  I have reviewed the patient's chart and labs.  Questions were answered to the patient's satisfaction.     Ruta Hinds

## 2016-08-15 NOTE — Progress Notes (Addendum)
Pt incontinent of stool.  This nurse and NT assisted Pt with cares.  Was noted that Pt had pink foam on sacrum that was soiled with stool.  Upon removal of pink foam assessed two small pressure areas on sacrum.  Care plan updated.  Braden scale updated.  Will notify on call.  Pt for aortogram in AM.  Will cont to monitor.

## 2016-08-15 NOTE — Progress Notes (Signed)
PROGRESS NOTE   Todd [name]  ATF:[id number] DOB: 1956/04/19 DOA: 08/08/2016   PCP: [name], MD   Brief Narrative:  61 year old man admitted to the hospital from home on 2/26 due to "leg blisters". Thought to have gangrene of left leg and possible cellulitis of the right lower extremity. Admission requested for further evaluation and management.  Assessment & Plan:   Sepsis ruled in and in the setting of left lower extremity ischemic changes in the left foot extending to just above the ankle - Likely has peripheral vascular disease in the setting of complications from long-standing diabetes. - with gangrenous changes to the left foot worse than the right foot. - Ankle-brachial indices are ordered, right 0.29, left inaudible  - pt is now s/p Transtibial amputation and application of Prevena wound VAC, application of stump shrinker, post op day #1 (Dr. Sharol Given) - vascular surgery also following, pt has right SFA occlusion with reconstitution of below knee pop then severe tibial disease below this with no flow crossing the ankle, plan to schedule for a-gram and attempt SFA pop recanalization on Monday; if successful he is still VERY high risk of limb loss - leukocytosis is due to foot issues, continue vancomycin and rocephin  - plan for Right lower extremity arteriogram today per vascular surgery   Thrombocytosis - reactive, slightly better this AM  - CBC In AM  Hypotension - SBP in 100's, monitor   Hypokalemia - supplemented and WNL this AM  Type 2 diabetes with complications of PVD - with some hypoglycemic episodes - will liberalize diet  - keep on SSI  Acute renal failure - resolved with IVF - BMP in AM  Hypothyroidism - Continue Synthroid   DVT prophylaxis: lovenox Code Status: full code Family Communication: no family at bedside  Disposition Plan: to be determined   Consultants:   Surgery  Vascular surgery   Antimicrobials:  Anti-infectives    Start      Dose/Rate Route Frequency Ordered Stop   08/14/16 1300  [MAR Hold]  vancomycin (VANCOCIN) IVPB 750 mg/150 ml premix     (MAR Hold since 08/15/16 1043)   750 mg 150 mL/hr over 60 Minutes Intravenous Every 12 hours 08/14/16 1154     08/12/16 0845  cefUROXime (ZINACEF) 1.5 g in dextrose 5 % 50 mL IVPB     1.5 g 100 mL/hr over 30 Minutes Intravenous On call to O.R. 08/12/16 0830 08/13/16 0559   08/09/16 1600  vancomycin (VANCOCIN) IVPB 1000 mg/200 mL premix  Status:  Discontinued     1,000 mg 200 mL/hr over 60 Minutes Intravenous Every 24 hours 08/08/16 2153 08/14/16 1154   08/08/16 2200  [MAR Hold]  cefTRIAXone (ROCEPHIN) 2 g in dextrose 5 % 50 mL IVPB     (MAR Hold since 08/15/16 1043)   2 g 100 mL/hr over 30 Minutes Intravenous Every 24 hours 08/08/16 2056     08/08/16 2200  metroNIDAZOLE (FLAGYL) tablet 500 mg  Status:  Discontinued     500 mg Oral Every 8 hours 08/08/16 2056 08/09/16 1554   08/08/16 1615  vancomycin (VANCOCIN) IVPB 1000 mg/200 mL premix     1,000 mg 200 mL/hr over 60 Minutes Intravenous  Once 08/08/16 1610 08/08/16 1801   08/08/16 1615  piperacillin-tazobactam (ZOSYN) IVPB 3.375 g     3.375 g 100 mL/hr over 30 Minutes Intravenous  Once 08/08/16 1610 08/08/16 1720     Subjective: no complaints this morning  Objective: Vitals:   08/14/16 1300 08/14/16 1832  08/14/16 2150 08/15/16 0533  BP: 100/72 127/68 126/80 137/77  Pulse: 84 86 83 95  Resp: 20  18 18   Temp:   98.7 F (37.1 C) 97.9 F (36.6 C)  TempSrc:   Oral Oral  SpO2: 97% 100% 100% 95%  Weight:      Height:        Intake/Output Summary (Last 24 hours) at 08/15/16 1046 Last data filed at 08/15/16 0800  Gross per 24 hour  Intake              480 ml  Output              675 ml  Net             -195 ml   Filed Weights   08/08/16 1600 08/08/16 2052 08/10/16 0600  Weight: 77.1 kg (170 lb) 70.9 kg (156 lb 4.8 oz) 69.7 kg (153 lb 9.6 oz)    Examination:  General exam: Alert, awake, oriented x  3 Respiratory system: Clear to auscultation. Respiratory effort normal. Cardiovascular system:RRR. No murmurs, rubs, gallops. Gastrointestinal system: Abdomen is nondistended, soft and nontender. No organomegaly or masses felt. Normal bowel sounds heard. Psychiatry: Judgement and insight appear normal. Mood & affect appropriate.   Data Reviewed: I have personally reviewed following labs and imaging studies  CBC:  Recent Labs Lab 08/08/16 1617  08/10/16 0422 08/12/16 0332 08/13/16 0332 08/14/16 0244 08/15/16 0423  WBC 22.7*  < > 25.1* 23.1* 19.6* 22.5* 23.0*  NEUTROABS 17.7*  --   --   --   --   --   --   HGB 12.5*  < > 11.7* 11.3* 9.4* 10.1* 9.9*  HCT 36.3*  < > 34.5* 34.3* 28.9* 31.2* 30.1*  MCV 84.0  < > 83.1 84.3 83.5 84.6 84.8  PLT 710*  < > 741* 598* 572* 616* 580*  < > = values in this interval not displayed. Basic Metabolic Panel:  Recent Labs Lab 08/10/16 0422 08/12/16 0332 08/13/16 0332 08/14/16 0244 08/15/16 0423  NA 133* 138 134* 137 134*  K 3.8 3.3* 3.3* 3.7 3.9  CL 104 100* 101 104 103  CO2 19* 25 22 25 22   GLUCOSE 115* 64* 117* 52* 81  BUN 14 6 9 10 11   CREATININE 1.03 1.12 1.07 1.17 1.18  CALCIUM 8.2* 8.0* 7.2* 7.2* 7.2*   Liver Function Tests:  Recent Labs Lab 08/08/16 1617  AST 50*  ALT 40  ALKPHOS 87  BILITOT 0.6  PROT 8.6*  ALBUMIN 2.4*   Coagulation Profile:  Recent Labs Lab 08/08/16 2057  INR 1.11   CBG:  Recent Labs Lab 08/14/16 1108 08/14/16 1620 08/14/16 2105 08/15/16 0138 08/15/16 0620  GLUCAP 116* 135* 101* 117* 89   Urine analysis:    Component Value Date/Time   COLORURINE YELLOW 08/08/2016 1610   APPEARANCEUR CLEAR 08/08/2016 1610   APPEARANCEUR Clear 08/05/2016 1630   LABSPEC 1.011 08/08/2016 1610   PHURINE 5.0 08/08/2016 1610   GLUCOSEU NEGATIVE 08/08/2016 1610   HGBUR NEGATIVE 08/08/2016 [place] 08/08/2016 1610   BILIRUBINUR Negative 08/05/2016 [place] 08/08/2016  1610   PROTEINUR NEGATIVE 08/08/2016 1610   NITRITE NEGATIVE 08/08/2016 1610   LEUKOCYTESUR NEGATIVE 08/08/2016 1610   LEUKOCYTESUR Negative 08/05/2016 1630    Recent Results (from the past 240 hour(s))  Microscopic Examination     Status: None   Collection Time: 08/05/16  4:30 PM  Result Value Ref Range Status  WBC, UA None seen 0 - 5 /hpf Final   RBC, UA 0-2 0 - 2 /hpf Final   Epithelial Cells (non renal) None seen 0 - 10 /hpf Final   Renal Epithel, UA None seen None seen /hpf Final   Mucus, UA Present Not Estab. Final   Bacteria, UA None seen None seen/Few Final  Blood Culture (routine x 2)     Status: None   Collection Time: 08/08/16  4:31 PM  Result Value Ref Range Status   Specimen Description BLOOD RIGHT HAND  Final   Special Requests BOTTLES DRAWN AEROBIC AND ANAEROBIC [place]  Final   Culture NO GROWTH 5 DAYS  Final   Report Status 08/13/2016 FINAL  Final  Blood Culture (routine x 2)     Status: None   Collection Time: 08/08/16  4:39 PM  Result Value Ref Range Status   Specimen Description BLOOD RIGHT HAND  Final   Special Requests BOTTLES DRAWN AEROBIC ONLY [place] AEB  Final   Culture NO GROWTH 5 DAYS  Final   Report Status 08/13/2016 FINAL  Final    Radiology Studies: No results found. Scheduled Meds: . [MAR Hold] aspirin EC  325 mg Oral Daily  . [MAR Hold] carvedilol  3.125 mg Oral BID WC  . [MAR Hold] cefTRIAXone (ROCEPHIN)  IV  2 g Intravenous Q24H  . [MAR Hold] colchicine  0.6 mg Oral BID  . [MAR Hold] docusate sodium  100 mg Oral BID  . [MAR Hold] enoxaparin (LOVENOX) injection  40 mg Subcutaneous Q24H  . [MAR Hold] feeding supplement (GLUCERNA SHAKE)  237 mL Oral BID BM  . [MAR Hold] Gerhardt's butt cream   Topical QID  . [MAR Hold] insulin aspart  0-15 Units Subcutaneous TID WC  . [MAR Hold] insulin glargine  20 Units Subcutaneous Daily  . [MAR Hold] levothyroxine  100 mcg Oral QAC breakfast  . [MAR Hold] pregabalin  200 mg Oral BID  . [MAR Hold]  vancomycin  750 mg Intravenous Q12H   Continuous Infusions: . sodium chloride 10 mL/hr at 08/12/16 2030    LOS: 7 days   Time spent: 30 minutes. Greater than 50% of this time was spent in direct contact with the patient coordinating care.  Faye Ramsay, MD [facility] Pager [phone]  If 7PM-7AM, please contact night-coverage [email or web address] Password [email or web address] 08/15/2016, 10:46 AM

## 2016-08-16 ENCOUNTER — Encounter (HOSPITAL_COMMUNITY): Payer: Self-pay | Admitting: Vascular Surgery

## 2016-08-16 LAB — BASIC METABOLIC PANEL
Anion gap: 8 (ref 5–15)
BUN: 11 mg/dL (ref 6–20)
CHLORIDE: 104 mmol/L (ref 101–111)
CO2: 19 mmol/L — AB (ref 22–32)
Calcium: 6.9 mg/dL — ABNORMAL LOW (ref 8.9–10.3)
Creatinine, Ser: 1.09 mg/dL (ref 0.61–1.24)
GFR calc Af Amer: >60 mL/min (ref >60)
GFR calc non Af Amer: >60 mL/min (ref >60)
Glucose, Bld: 175 mg/dL — ABNORMAL HIGH (ref 65–99)
POTASSIUM: 3.4 mmol/L — AB (ref 3.5–5.1)
SODIUM: 131 mmol/L — AB (ref 135–145)

## 2016-08-16 LAB — GLUCOSE, CAPILLARY
GLUCOSE-CAPILLARY: 65 mg/dL (ref 65–99)
Glucose-Capillary: 102 mg/dL — ABNORMAL HIGH (ref 65–99)
Glucose-Capillary: 116 mg/dL — ABNORMAL HIGH (ref 65–99)
Glucose-Capillary: 128 mg/dL — ABNORMAL HIGH (ref 65–99)
Glucose-Capillary: 118 mg/dL — ABNORMAL HIGH (ref 65–99)

## 2016-08-16 LAB — CBC
HEMATOCRIT: 28.8 % — AB (ref 39.0–52.0)
Hemoglobin: 9.4 g/dL — ABNORMAL LOW (ref 13.0–17.0)
MCH: 27.2 pg (ref 26.0–34.0)
MCHC: 32.6 g/dL (ref 30.0–36.0)
MCV: 83.2 fL (ref 78.0–100.0)
Platelets: 590 10*3/uL — ABNORMAL HIGH (ref 150–400)
RBC: 3.46 MIL/uL — AB (ref 4.22–5.81)
RDW: 13.8 % (ref 11.5–15.5)
WBC: 20.9 10*3/uL — AB (ref 4.0–10.5)

## 2016-08-16 MED ORDER — POTASSIUM CHLORIDE CRYS ER 20 MEQ PO TBCR
40.0000 meq | EXTENDED_RELEASE_TABLET | Freq: Once | ORAL | Status: AC
  Administered 2016-08-16: 40 meq via ORAL
  Filled 2016-08-16: qty 2

## 2016-08-16 NOTE — Progress Notes (Signed)
Physical Therapy Treatment Patient Details Name: Todd [name] MRN: [id number] DOB: 30-Aug-1955 Today's Date: 08/16/2016    History of Present Illness 61 yo admitted with RLE cellulitis and LLE gangrene s/p Lt BKA. PMHx: DM, CKD, HTN, gout    PT Comments    Pt pleasant incontinent of bowel and bladder this am with assist of tech. Pt educated for transfers, HEP and function with pt unable to stand with RLE despite 2 person assist could not achieve full standing. Pt only has assist of elderly mom and feel longer term rehab at SNF will be necessary at this time. Will continue to follow.    Follow Up Recommendations  SNF;Supervision/Assistance - 24 hour     Equipment Recommendations  3in1 (PT)    Recommendations for Other Services       Precautions / Restrictions Precautions Precautions: Fall Restrictions LLE Weight Bearing: Non weight bearing    Mobility  Bed Mobility Overal bed mobility: Needs Assistance Bed Mobility: Rolling;Sidelying to Sit Rolling: Min guard Sidelying to sit: Min assist       General bed mobility comments: cues for sequence with increased time, assist to elevate trunk from surface.  Transfers Overall transfer level: Needs assistance   Transfers: Sit to/from [employer] Transfers Sit to Stand: Max assist;+2 physical assistance;From elevated surface   Squat pivot transfers: Max assist     General transfer comment: sit to stand x  3 from elevated bed with use of belt and pad to facilitate rise, right knee blocked and posterior pelvic translation assisted into standing but pt with little to no quad activation to stand. Squat pivot with over the back technique max assist and cues bed to chair.   Ambulation/Gait             General Gait Details: unable   Stairs            Wheelchair Mobility    Modified Rankin (Stroke Patients Only)       Balance Overall balance assessment: Needs assistance   Sitting balance-Leahy  Scale: Fair       Standing balance-Leahy Scale: Zero                      Cognition Arousal/Alertness: Awake/alert Behavior During Therapy: Flat affect Overall Cognitive Status: Impaired/Different from baseline Area of Impairment: Memory     Memory: Decreased short-term memory              Exercises Amputee Exercises Hip Extension: Left;Sidelying;AROM;15 reps Hip ABduction/ADduction: AROM;Left;10 reps;15 reps;Seated Knee Extension: AROM;Both;Seated;15 reps Straight Leg Raises: AROM;Right;Seated;15 reps    General Comments        Pertinent Vitals/Pain Pain Score: 5  Pain Location: bil LE Pain Descriptors / Indicators: Aching;Sore Pain Intervention(s): Limited activity within patient's tolerance;Repositioned;Monitored during session    Home Living                      Prior Function            PT Goals (current goals can now be found in the care plan section) Progress towards PT goals: Progressing toward goals    Frequency           PT Plan Discharge plan needs to be updated    Co-evaluation             End of Session Equipment Utilized During Treatment: Gait belt Activity Tolerance: Patient tolerated treatment well Patient left: in chair;with call bell/phone within reach;with chair  alarm set Nurse Communication: Mobility status       Time: 7308-1683 PT Time Calculation (min) (ACUTE ONLY): 23 min  Charges:  $Therapeutic Exercise: 8-22 mins $Therapeutic Activity: 8-22 mins                    G Codes:       Callahan Peddie B Rihanna [place] 09/05/2016, 10:07 AM  Elwyn Reach, [place]

## 2016-08-16 NOTE — Progress Notes (Signed)
Nutrition Follow-up  DOCUMENTATION CODES:   Not applicable  INTERVENTION:  Continue Glucerna Shake BID between meals. Each supplement provides 220 kcal and 10 grams of protein.  Encouraged continued po intake.   NUTRITION DIAGNOSIS:   Increased nutrient needs related to wound healing as evidenced by estimated needs.  Ongoing  GOAL:   Patient will meet greater than or equal to 90% of their needs  Progressing  MONITOR:   PO intake, Supplement acceptance, Labs, Skin  ASSESSMENT:   61 y/o male PMHx HTN, HLD, DM, hypothyroidism. Presented with R foot blisters and swelling of both feet. Work up revealed concern for cellulitis of R foot and Gangrene of L foot.    Patient received left BKA on 3/2. Patient decided on SNF rehab rather than CIR dur to lack of caregiver support at home. Patient deciding by midnight whether to proceed with right BKA tomorrow.  No charted weight on patient since 2/28. Updated weight is needed. Per patient chart, patient consumed 100% of breakfast and lunch today. Patient intake yesterday (3/6) was 50% at breakfast and 20% at lunch. Day prior (3/4 intake was between 75-100%).   Patient reported that his appetite is inconsistent. He said his appetite was good today and that he consumed all of his breakfast (sausage and cheerios with milk) and lunch (french fries and BBQ). However, yesterday patient reported he was not very hungry. Patient reported he has been receiving 2 Glucerna's per day, but only drinking one. Patient was encouraged to try to drink more even if just a few sips of the second Glucerna. Patient agreed.  Labs Reviewed: Novolog, Lantus  Meds Reviewed: CBG 116, Sodium 131, Calcium 6.9  Diet Order:  Diet regular Room service appropriate? Yes; Fluid consistency: Thin Diet NPO time specified  Skin:  Wound (see comment)  Last BM:  3/5  Height:   Ht Readings from Last 1 Encounters:  08/08/16 5\' 8"  (1.727 m)    Weight:   Wt Readings  from Last 1 Encounters:  08/10/16 153 lb 9.6 oz (69.7 kg)    Ideal Body Weight:  70 kg  BMI:  Body mass index is 23.35 kg/m.  Estimated Nutritional Needs:   Kcal:  1900-2100 (27-30 kcal/kg bw)  Protein:  85-100g (1.2-1.4 g/kg bw)  Fluid:  >2.1 L (30 ml/kg bw)  EDUCATION NEEDS:   No education needs identified at this time  Juliann Pulse M.S. Nutrition Dietetic Intern

## 2016-08-16 NOTE — Progress Notes (Addendum)
[facility] Progress Note  Subjective  No complaints.   Objective Vitals:   08/15/16 2015 08/16/16 0417  BP: 137/77 105/70  Pulse: 86 82  Resp: 18 18  Temp: 100.1 F (37.8 C) 99.3 F (37.4 C)    Intake/Output Summary (Last 24 hours) at 08/16/16 0938 Last data filed at 08/16/16 0830  Gross per 24 hour  Intake             2796 ml  Output              675 ml  Net             2121 ml   Left groin without hematoma.  Left BKA with VAC Right foot with dry gangrene 4th and 5th toes. No drainage. Blister ruptured to dorsum right foot. Skin appears healthy at base of blister.   Assessment/Planning: 61 y.o. male is s/p: right lower extremity arteriogram 1 Day Post-Op   No options for revascularization right foot. Discussed right below-knee amputation versus conservative management with wound care. Also talked to sister on the phone. Patient and family are not ready to make decision yet but am leaning towards conservative management. Discussed that wound care is reasonable at this time given that his right foot does not appear infected. If his foot does worsen, he understands that he would need an amputation.   Alvia Grove 08/16/2016 9:38 AM -- Spoke with patent and his nephew regarding right below knee amputation.  Discussed with pt that pain will get worse over time and minimal chance of foot healing.  He currently is undecided on whether to proceed with amputation.  Pt nephew was present for conversation at pt request.  They will discuss further and try to make decision.  Will make NPO post midnight for BKA tomorrow.  If pt decides not to proceed he may eat and could go home from our standpoint.  I would not plan for narcotic management of this patient if he opts for home as this is only delaying definitive treatment unless he prefers palliative care with no intention of ever proceeding with BKA  Ruta Hinds, MD [facility]  [place]: [phone] Pager: [phone]  Laboratory CBC    Component Value Date/Time   WBC 20.9 (H) 08/16/2016 0231   HGB 9.4 (L) 08/16/2016 0231   HCT 28.8 (L) 08/16/2016 0231   HCT 36.1 (L) 08/05/2016 1630   PLT 590 (H) 08/16/2016 0231   PLT 793 (H) 08/05/2016 1630    BMET    Component Value Date/Time   NA 131 (L) 08/16/2016 0231   NA 136 08/03/2016 1119   K 3.4 (L) 08/16/2016 0231   CL 104 08/16/2016 0231   CO2 19 (L) 08/16/2016 0231   GLUCOSE 175 (H) 08/16/2016 0231   BUN 11 08/16/2016 0231   BUN 30 (H) 08/03/2016 1119   CREATININE 1.09 08/16/2016 0231   CALCIUM 6.9 (L) 08/16/2016 0231   GFRNONAA >60 08/16/2016 0231   GFRAA >60 08/16/2016 0231    COAG Lab Results  Component Value Date   INR 1.11 08/08/2016   No results found for: PTT  Antibiotics Anti-infectives    Start     Dose/Rate Route Frequency Ordered Stop   08/14/16 1300  vancomycin (VANCOCIN) IVPB 750 mg/150 ml premix     750 mg 150 mL/hr over 60 Minutes Intravenous Every 12 hours 08/14/16 1154     08/12/16 0845  cefUROXime (ZINACEF) 1.5 g in dextrose  5 % 50 mL IVPB     1.5 g 100 mL/hr over 30 Minutes Intravenous On call to O.R. 08/12/16 0830 08/13/16 0559   08/09/16 1600  vancomycin (VANCOCIN) IVPB 1000 mg/200 mL premix  Status:  Discontinued     1,000 mg 200 mL/hr over 60 Minutes Intravenous Every 24 hours 08/08/16 2153 08/14/16 1154   08/08/16 2200  cefTRIAXone (ROCEPHIN) 2 g in dextrose 5 % 50 mL IVPB     2 g 100 mL/hr over 30 Minutes Intravenous Every 24 hours 08/08/16 2056     08/08/16 2200  metroNIDAZOLE (FLAGYL) tablet 500 mg  Status:  Discontinued     500 mg Oral Every 8 hours 08/08/16 2056 08/09/16 1554   08/08/16 1615  vancomycin (VANCOCIN) IVPB 1000 mg/200 mL premix     1,000 mg 200 mL/hr over 60 Minutes Intravenous  Once 08/08/16 1610 08/08/16 1801   08/08/16 1615  piperacillin-tazobactam (ZOSYN) IVPB 3.375 g     3.375 g 100 mL/hr over 30 Minutes Intravenous  Once  08/08/16 1610 08/08/16 [place], PA-C [facility] Office: [phone] Pager: [phone] 08/16/2016 9:38 AM

## 2016-08-16 NOTE — Progress Notes (Signed)
Accu ck. 65 N.T. Gave patient graham crackers and peanut butter and O.J.

## 2016-08-16 NOTE — Progress Notes (Addendum)
Accu. Ck. 102

## 2016-08-16 NOTE — Progress Notes (Signed)
P.T. Has changed recommendation to SNF rehab rather than CIR due to lack of caregiver support. [phone]

## 2016-08-16 NOTE — Care Management Note (Addendum)
Case Management Note  Patient Details  Name: Davan Nawabi MRN: [id number] Date of Birth: Oct 06, 1955  Subjective/Objective:                 Patient lives at home with his elderly mother. Patient has L AKA, R leg being evaluated for intervention as well this hospitalization. Patient currently has wound VAC to L stump. CM spoke to patient about DC needs. Patient originally rec CIR by PT, now changed to SNF. Patient agreeable to SNF at DC when medically ready. CM and CSW will continue to follow for DC planning needs.    Action/Plan:  CSW consult placed for anticipated SNF DC.  Addendum 08/19/16 Per DC summ, patient will not have VAC at SNF, CSW updated. DC today to SNF per MD order, facilitated by CSW.   Expected Discharge Date:  08/11/16               Expected Discharge Plan:  [place]  In-House Referral:  NA  Discharge planning Services  CM Consult  Post Acute Care Choice:    Choice offered to:     DME Arranged:    DME Agency:     HH Arranged:    HH Agency:     Status of Service:  In process, will continue to follow  If discussed at Long Length of Stay Meetings, dates discussed:    Additional Comments:  Carles Collet, RN 08/16/2016, 11:11 AM

## 2016-08-16 NOTE — Progress Notes (Signed)
PROGRESS NOTE   Todd [name]  XIH:[id number] DOB: [birth year] DOA: 08/08/2016   PCP: [name], MD   Brief Narrative:  61 year old man admitted to the hospital from home on 2/26 due to "leg blisters". Thought to have gangrene of left leg and possible cellulitis of the right lower extremity. Admission requested for further evaluation and management.  Assessment & Plan:   Sepsis ruled in and in the setting of left lower extremity ischemic changes in the left foot extending to just above the ankle - Likely has peripheral vascular disease in the setting of complications from long-standing diabetes. - with gangrenous changes to the left foot worse than the right foot. - Ankle-brachial indices are ordered, right 0.29, left inaudible  - s/p Transtibial amputation and application of Prevena wound VAC, application of stump shrinker, post op day #4 (Dr. Sharol Given operated on 08/12/2016) - vascular surgery also following, pt has right SFA occlusion with reconstitution of below knee pop then severe tibial disease below this with no flow crossing the ankle, s/p: right lower extremity arteriogram, post op day #1 - per vascular surgery, no options for revascularization right foot, pt needs amputation but for now has declined this - he will need right BKA, tentatively made NPO with plan to proceed with BKA in AM per vascular surgery, pt and family to talk all tonight - if pt decides not to proceed he may eat and could go home from vascular surgery standpoint, would not plan for narcotic management of this patient if he opts for home as this is only delaying definitive treatment unless he prefers palliative care with no intention of ever proceeding with BKA - will follow up with pt in am again to give him change to discuss this with family  - stop IV ABX   Thrombocytosis - reactive from the acute process - CBC In AM  Hypotension - reasonable this AM - monitor   Hypokalemia - supplement and repeat BMP  In AM  Type 2 diabetes with complications of PVD - with some hypoglycemic episodes - will liberalize diet, held lantus today  - keep on SSI  Acute renal failure - resolved with IVF - BMP in AM  Hypothyroidism - Continue Synthroid   DVT prophylaxis: lovenox Code Status: full code Family Communication: pt and several members of family at bedside  Disposition Plan: to be determined, likely SNF In AM if he refused BKA  Consultants:   Surgery, ortho - Dr. Sharol Given  Vascular surgery   Antimicrobials:  Anti-infectives    Start     Dose/Rate Route Frequency Ordered Stop   08/14/16 1300  vancomycin (VANCOCIN) IVPB 750 mg/150 ml premix  Status:  Discontinued     750 mg 150 mL/hr over 60 Minutes Intravenous Every 12 hours 08/14/16 1154 08/16/16 1422   08/12/16 0845  cefUROXime (ZINACEF) 1.5 g in dextrose 5 % 50 mL IVPB     1.5 g 100 mL/hr over 30 Minutes Intravenous On call to O.R. 08/12/16 0830 08/13/16 0559   08/09/16 1600  vancomycin (VANCOCIN) IVPB 1000 mg/200 mL premix  Status:  Discontinued     1,000 mg 200 mL/hr over 60 Minutes Intravenous Every 24 hours 08/08/16 2153 08/14/16 1154   08/08/16 2200  cefTRIAXone (ROCEPHIN) 2 g in dextrose 5 % 50 mL IVPB  Status:  Discontinued     2 g 100 mL/hr over 30 Minutes Intravenous Every 24 hours 08/08/16 2056 08/16/16 1418   08/08/16 2200  metroNIDAZOLE (FLAGYL) tablet 500 mg  Status:  Discontinued     500 mg Oral Every 8 hours 08/08/16 2056 08/09/16 1554   08/08/16 1615  vancomycin (VANCOCIN) IVPB 1000 mg/200 mL premix     1,000 mg 200 mL/hr over 60 Minutes Intravenous  Once 08/08/16 1610 08/08/16 1801   08/08/16 1615  piperacillin-tazobactam (ZOSYN) IVPB 3.375 g     3.375 g 100 mL/hr over 30 Minutes Intravenous  Once 08/08/16 1610 08/08/16 1720     Subjective: no complaints this morning  Objective: Vitals:   08/15/16 1208 08/15/16 2015 08/16/16 0417 08/16/16 1500  BP: 104/68 137/77 105/70 140/60  Pulse: 82 86 82 90  Resp: 18  18 18 20   Temp: 98.2 F (36.8 C) 100.1 F (37.8 C) 99.3 F (37.4 C) 99.7 F (37.6 C)  TempSrc: Oral Oral Oral Oral  SpO2: 100% 100% 100% 100%  Weight:      Height:        Intake/Output Summary (Last 24 hours) at 08/16/16 1746 Last data filed at 08/16/16 1744  Gross per 24 hour  Intake             2450 ml  Output             1075 ml  Net             1375 ml   Filed Weights   08/08/16 1600 08/08/16 2052 08/10/16 0600  Weight: 77.1 kg (170 lb) 70.9 kg (156 lb 4.8 oz) 69.7 kg (153 lb 9.6 oz)    Examination:  General exam: Alert, awake, oriented x 3 Respiratory system: Clear to auscultation. Respiratory effort normal. Cardiovascular system:RRR. No murmurs, rubs, gallops. Gastrointestinal system: Abdomen is nondistended, soft and nontender. No organomegaly or masses felt.  Psychiatry: Judgement and insight appear normal. Mood & affect appropriate.  Extremities: Left groin without hematoma, Left BKA with VAC. Right foot with dry gangrene 4th and 5th toes. No drainage. Blister ruptured to dorsum right foot.   Data Reviewed: I have personally reviewed following labs and imaging studies  CBC:  Recent Labs Lab 08/12/16 0332 08/13/16 0332 08/14/16 0244 08/15/16 0423 08/16/16 0231  WBC 23.1* 19.6* 22.5* 23.0* 20.9*  HGB 11.3* 9.4* 10.1* 9.9* 9.4*  HCT 34.3* 28.9* 31.2* 30.1* 28.8*  MCV 84.3 83.5 84.6 84.8 83.2  PLT 598* 572* 616* 580* 765*   Basic Metabolic Panel:  Recent Labs Lab 08/12/16 0332 08/13/16 0332 08/14/16 0244 08/15/16 0423 08/16/16 0231  NA 138 134* 137 134* 131*  K 3.3* 3.3* 3.7 3.9 3.4*  CL 100* 101 104 103 104  CO2 25 22 25 22  19*  GLUCOSE 64* 117* 52* 81 175*  BUN 6 9 10 11 11   CREATININE 1.12 1.07 1.17 1.18 1.09  CALCIUM 8.0* 7.2* 7.2* 7.2* 6.9*   CBG:  Recent Labs Lab 08/15/16 2037 08/16/16 0614 08/16/16 0644 08/16/16 1244 08/16/16 1634  GLUCAP 104* 65 102* 116* 128*   Urine analysis:    Component Value Date/Time   COLORURINE  YELLOW 08/08/2016 [place] 08/08/2016 1610   APPEARANCEUR Clear 08/05/2016 1630   LABSPEC 1.011 08/08/2016 1610   PHURINE 5.0 08/08/2016 1610   GLUCOSEU NEGATIVE 08/08/2016 1610   HGBUR NEGATIVE 08/08/2016 [place] 08/08/2016 1610   BILIRUBINUR Negative 08/05/2016 [place] 08/08/2016 1610   PROTEINUR NEGATIVE 08/08/2016 1610   NITRITE NEGATIVE 08/08/2016 1610   LEUKOCYTESUR NEGATIVE 08/08/2016 1610   LEUKOCYTESUR Negative 08/05/2016 1630    Recent Results (from the past 240  hour(s))  Blood Culture (routine x 2)     Status: None   Collection Time: 08/08/16  4:31 PM  Result Value Ref Range Status   Specimen Description BLOOD RIGHT HAND  Final   Special Requests BOTTLES DRAWN AEROBIC AND ANAEROBIC [place]  Final   Culture NO GROWTH 5 DAYS  Final   Report Status 08/13/2016 FINAL  Final  Blood Culture (routine x 2)     Status: None   Collection Time: 08/08/16  4:39 PM  Result Value Ref Range Status   Specimen Description BLOOD RIGHT HAND  Final   Special Requests BOTTLES DRAWN AEROBIC ONLY [place] AEB  Final   Culture NO GROWTH 5 DAYS  Final   Report Status 08/13/2016 FINAL  Final    Radiology Studies: No results found. Scheduled Meds: . aspirin EC  325 mg Oral Daily  . carvedilol  3.125 mg Oral BID WC  . colchicine  0.6 mg Oral BID  . docusate sodium  100 mg Oral BID  . enoxaparin (LOVENOX) injection  40 mg Subcutaneous Q24H  . feeding supplement (GLUCERNA SHAKE)  237 mL Oral BID BM  . Gerhardt's butt cream   Topical QID  . insulin aspart  0-15 Units Subcutaneous TID WC  . insulin glargine  10 Units Subcutaneous Daily  . levothyroxine  100 mcg Oral QAC breakfast  . pregabalin  200 mg Oral BID   Continuous Infusions: . sodium chloride Stopped (08/16/16 0236)  . sodium chloride 10 mL/hr at 08/16/16 0236    LOS: 8 days   Time spent: 30 minutes. Greater than 50% of this time was spent in direct contact with the patient  coordinating care.  Faye Ramsay, MD [facility] Pager [phone]  If 7PM-7AM, please contact night-coverage [email or web address] Password [facility] 08/16/2016, 5:46 PM

## 2016-08-16 NOTE — Progress Notes (Signed)
Patient cont. To refuses surgery and sign permit

## 2016-08-17 ENCOUNTER — Encounter (HOSPITAL_COMMUNITY)
Admission: EM | Disposition: A | Discharge status: Skilled Nursing Facility | Payer: Self-pay | Source: Home / Self Care | Attending: Internal Medicine

## 2016-08-17 ENCOUNTER — Inpatient Hospital Stay (HOSPITAL_COMMUNITY): Payer: BLUE CROSS/BLUE SHIELD | Admitting: Certified Registered Nurse Anesthetist

## 2016-08-17 ENCOUNTER — Encounter (HOSPITAL_COMMUNITY): Payer: Self-pay | Admitting: Certified Registered Nurse Anesthetist

## 2016-08-17 DIAGNOSIS — I70261 Atherosclerosis of native arteries of extremities with gangrene, right leg: Secondary | ICD-10-CM

## 2016-08-17 HISTORY — PX: AMPUTATION: SHX166

## 2016-08-17 LAB — BASIC METABOLIC PANEL
Anion gap: 10 (ref 5–15)
BUN: 8 mg/dL (ref 6–20)
CHLORIDE: 105 mmol/L (ref 101–111)
CO2: 22 mmol/L (ref 22–32)
CREATININE: 1 mg/dL (ref 0.61–1.24)
Calcium: 7.5 mg/dL — ABNORMAL LOW (ref 8.9–10.3)
GFR calc non Af Amer: >60 mL/min (ref >60)
GLUCOSE: 95 mg/dL (ref 65–99)
Potassium: 4 mmol/L (ref 3.5–5.1)
Sodium: 137 mmol/L (ref 135–145)

## 2016-08-17 LAB — GLUCOSE, CAPILLARY
Glucose-Capillary: 83 mg/dL (ref 65–99)
Glucose-Capillary: 91 mg/dL (ref 65–99)
Glucose-Capillary: 121 mg/dL — ABNORMAL HIGH (ref 65–99)

## 2016-08-17 LAB — CBC
HCT: 30.9 % — ABNORMAL LOW (ref 39.0–52.0)
Hemoglobin: 10.1 g/dL — ABNORMAL LOW (ref 13.0–17.0)
MCH: 27.2 pg (ref 26.0–34.0)
MCHC: 32.7 g/dL (ref 30.0–36.0)
MCV: 83.3 fL (ref 78.0–100.0)
Platelets: 604 10*3/uL — ABNORMAL HIGH (ref 150–400)
RBC: 3.71 MIL/uL — AB (ref 4.22–5.81)
RDW: 13.8 % (ref 11.5–15.5)
WBC: 18.3 10*3/uL — ABNORMAL HIGH (ref 4.0–10.5)

## 2016-08-17 LAB — SURGICAL PCR SCREEN
MRSA, PCR: NEGATIVE
STAPHYLOCOCCUS AUREUS: NEGATIVE

## 2016-08-17 SURGERY — AMPUTATION BELOW KNEE
Anesthesia: General | Site: Leg Lower | Laterality: Right

## 2016-08-17 MED ORDER — PROMETHAZINE HCL 25 MG/ML IJ SOLN: 6.2500 mg | INTRAMUSCULAR | Status: DC | PRN

## 2016-08-17 MED ORDER — LIDOCAINE 2% (20 MG/ML) 5 ML SYRINGE
INTRAMUSCULAR | Status: DC | PRN
  Administered 2016-08-17: 60 mg via INTRAVENOUS

## 2016-08-17 MED ORDER — OXYCODONE HCL 5 MG/5ML PO SOLN: 5.0000 mg | Freq: Once | ORAL | Status: DC | PRN

## 2016-08-17 MED ORDER — LACTATED RINGERS IV SOLN
INTRAVENOUS | Status: DC
  Administered 2016-08-17 – 2016-08-18 (×2): via INTRAVENOUS

## 2016-08-17 MED ORDER — FENTANYL CITRATE (PF) 100 MCG/2ML IJ SOLN
INTRAMUSCULAR | Status: AC
  Filled 2016-08-17: qty 4

## 2016-08-17 MED ORDER — HYDROMORPHONE HCL 1 MG/ML IJ SOLN: 0.2500 mg | INTRAMUSCULAR | Status: DC | PRN

## 2016-08-17 MED ORDER — PROPOFOL 10 MG/ML IV BOLUS
INTRAVENOUS | Status: DC | PRN
  Administered 2016-08-17: 150 mg via INTRAVENOUS
  Administered 2016-08-17: 20 mg via INTRAVENOUS
  Administered 2016-08-17: 30 mg via INTRAVENOUS
  Administered 2016-08-17: 50 mg via INTRAVENOUS

## 2016-08-17 MED ORDER — PHENYLEPHRINE HCL 10 MG/ML IJ SOLN
INTRAMUSCULAR | Status: DC | PRN
  Administered 2016-08-17 (×3): 80 ug via INTRAVENOUS

## 2016-08-17 MED ORDER — MIDAZOLAM HCL 2 MG/2ML IJ SOLN
INTRAMUSCULAR | Status: AC
  Filled 2016-08-17: qty 2

## 2016-08-17 MED ORDER — 0.9 % SODIUM CHLORIDE (POUR BTL) OPTIME
TOPICAL | Status: DC | PRN
  Administered 2016-08-17: 1000 mL

## 2016-08-17 MED ORDER — ONDANSETRON HCL 4 MG/2ML IJ SOLN
INTRAMUSCULAR | Status: DC | PRN
  Administered 2016-08-17: 4 mg via INTRAVENOUS

## 2016-08-17 MED ORDER — MIDAZOLAM HCL 5 MG/5ML IJ SOLN
INTRAMUSCULAR | Status: DC | PRN
  Administered 2016-08-17 (×2): 1 mg via INTRAVENOUS

## 2016-08-17 MED ORDER — FENTANYL CITRATE (PF) 100 MCG/2ML IJ SOLN
INTRAMUSCULAR | Status: DC | PRN
  Administered 2016-08-17 (×3): 25 ug via INTRAVENOUS
  Administered 2016-08-17: 50 ug via INTRAVENOUS
  Administered 2016-08-17 (×3): 25 ug via INTRAVENOUS

## 2016-08-17 MED ORDER — OXYCODONE HCL 5 MG PO TABS: 5.0000 mg | ORAL_TABLET | Freq: Once | ORAL | Status: DC | PRN

## 2016-08-17 MED ORDER — PROPOFOL 10 MG/ML IV BOLUS
INTRAVENOUS | Status: AC
  Filled 2016-08-17: qty 20

## 2016-08-17 MED ORDER — CEFAZOLIN SODIUM 1 G IJ SOLR
INTRAMUSCULAR | Status: DC | PRN
  Administered 2016-08-17: 1 g via INTRAMUSCULAR

## 2016-08-17 SURGICAL SUPPLY — 48 items
BANDAGE ACE 6X5 VEL STRL LF (GAUZE/BANDAGES/DRESSINGS) ×2 IMPLANT
BANDAGE ESMARK 6X9 LF (GAUZE/BANDAGES/DRESSINGS) IMPLANT
BLADE SAW RECIP 87.9 MT (BLADE) ×2 IMPLANT
BNDG CMPR 9X6 STRL LF SNTH (GAUZE/BANDAGES/DRESSINGS)
BNDG COHESIVE 6X5 TAN STRL LF (GAUZE/BANDAGES/DRESSINGS) ×2 IMPLANT
BNDG ESMARK 6X9 LF (GAUZE/BANDAGES/DRESSINGS)
BNDG GAUZE ELAST 4 BULKY (GAUZE/BANDAGES/DRESSINGS) ×3 IMPLANT
CANISTER SUCT 3000ML PPV (MISCELLANEOUS) ×2 IMPLANT
CLIP TI MEDIUM 6 (CLIP) ×1 IMPLANT
COVER BACK TABLE 60X90IN (DRAPES) ×2 IMPLANT
COVER SURGICAL LIGHT HANDLE (MISCELLANEOUS) ×2 IMPLANT
CUFF TOURNIQUET SINGLE 18IN (TOURNIQUET CUFF) IMPLANT
CUFF TOURNIQUET SINGLE 24IN (TOURNIQUET CUFF) IMPLANT
CUFF TOURNIQUET SINGLE 34IN LL (TOURNIQUET CUFF) IMPLANT
CUFF TOURNIQUET SINGLE 44IN (TOURNIQUET CUFF) IMPLANT
DRAIN CHANNEL 19F RND (DRAIN) IMPLANT
DRAPE ORTHO SPLIT 77X108 STRL (DRAPES) ×4
DRAPE PROXIMA HALF (DRAPES) ×4 IMPLANT
DRAPE SURG ORHT 6 SPLT 77X108 (DRAPES) ×2 IMPLANT
DRSG ADAPTIC 3X8 NADH LF (GAUZE/BANDAGES/DRESSINGS) ×2 IMPLANT
ELECT REM PT RETURN 9FT ADLT (ELECTROSURGICAL) ×2
ELECTRODE REM PT RTRN 9FT ADLT (ELECTROSURGICAL) ×1 IMPLANT
EVACUATOR SILICONE 100CC (DRAIN) IMPLANT
GAUZE SPONGE 4X4 12PLY STRL (GAUZE/BANDAGES/DRESSINGS) ×4 IMPLANT
GLOVE BIO SURGEON STRL SZ7 (GLOVE) ×3 IMPLANT
GLOVE BIO SURGEON STRL SZ7.5 (GLOVE) ×2 IMPLANT
GOWN STRL REUS W/ TWL LRG LVL3 (GOWN DISPOSABLE) ×3 IMPLANT
GOWN STRL REUS W/TWL LRG LVL3 (GOWN DISPOSABLE) ×6
KIT BASIN OR (CUSTOM PROCEDURE TRAY) ×2 IMPLANT
KIT ROOM TURNOVER OR (KITS) ×2 IMPLANT
NS IRRIG 1000ML POUR BTL (IV SOLUTION) ×2 IMPLANT
PACK GENERAL/GYN (CUSTOM PROCEDURE TRAY) ×2 IMPLANT
PAD ARMBOARD 7.5X6 YLW CONV (MISCELLANEOUS) ×4 IMPLANT
PADDING CAST COTTON 6X4 STRL (CAST SUPPLIES) IMPLANT
SPONGE GAUZE 4X4 12PLY STER LF (GAUZE/BANDAGES/DRESSINGS) ×1 IMPLANT
STAPLER VISISTAT 35W (STAPLE) ×2 IMPLANT
STOCKINETTE IMPERVIOUS LG (DRAPES) ×2 IMPLANT
SUT ETHILON 3 0 PS 1 (SUTURE) IMPLANT
SUT SILK 2 0 (SUTURE)
SUT SILK 2 0 SH CR/8 (SUTURE) ×5 IMPLANT
SUT SILK 2-0 18XBRD TIE 12 (SUTURE) ×1 IMPLANT
SUT VIC AB 2-0 SH 18 (SUTURE) ×2 IMPLANT
SUT VIC AB 3-0 SH 27 (SUTURE) ×2
SUT VIC AB 3-0 SH 27X BRD (SUTURE) ×1 IMPLANT
TOWEL OR 17X24 6PK STRL BLUE (TOWEL DISPOSABLE) ×2 IMPLANT
TOWEL OR 17X26 10 PK STRL BLUE (TOWEL DISPOSABLE) ×2 IMPLANT
UNDERPAD 30X30 (UNDERPADS AND DIAPERS) ×2 IMPLANT
WATER STERILE IRR 1000ML POUR (IV SOLUTION) ×2 IMPLANT

## 2016-08-17 NOTE — Interval H&P Note (Signed)
History and Physical Interval Note:  08/17/2016 3:22 PM  Todd [name]  has presented today for surgery, with the diagnosis of Nonviable tissue  The various methods of treatment have been discussed with the patient and family. After consideration of risks, benefits and other options for treatment, the patient has consented to  Procedure(s): AMPUTATION BELOW KNEE (Right) as a surgical intervention .  The patient's history has been reviewed, patient examined, no change in status, stable for surgery.  I have reviewed the patient's chart and labs.  Questions were answered to the patient's satisfaction.     Ruta Hinds

## 2016-08-17 NOTE — Op Note (Signed)
Procedure: Right below-knee amputation  Preoperative diagnosis: Gangrene right foot  Postoperative diagnosis: Same  Anesthesia Gen.  Assistant: Silva Bandy PA-C  Upper findings: #1 calcified vessels with reasonably well perfused muscle tissue  Operative details: After obtaining informed consent, the patient was taken to the operating room. The patient was placed in supine position on the operating table. After induction of general anesthesia and endotracheal intubation, the patient's entire right lower extremity was prepped and draped all the way down to the level of the ankle. Next a transverse incision was made approximately 4 fingerbreadths below the tibial tuberosity on the right leg.  The  incision was carried posteriorly to the midportion of the leg and then extended longitudinally to create a posterior flap. The subcutaneous tissues and fascia was taken down with cautery. Periosteum was raised on the tibia approximately 5 cm above the skin edge.  The periosteum was also raised on the fibula several centimeters above this. The tibia was then divided with a saw. The fibula was divided with a bone cutter. The leg was then elevated in the operative field and the amputation was completed posterior to the bones with an amputation knife. Hemostasis was then obtained with cautery and several suture ligatures. The tibial and sural nerves were pulled down into the field transected and allowed to retract up into the leg.  After hemostasis was obtained, the wound was thoroughly irrigated with  normal saline solution. The fascial edges were then reapproximated with interrupted 2-0 Vicryl sutures. Subcutaneous tissues were reapproximated using running 3-0 Vicryl suture. The skin was closed with staples. The patient tolerated the procedure well and there were no complications. Instrument sponge and  needle counts were correct at the end of the case. The patient was taken to the recovery room in stable  condition.  Ruta Hinds, MD [facility] [place] Office: [phone] Pager: [phone]

## 2016-08-17 NOTE — Anesthesia Procedure Notes (Signed)
Procedure Name: LMA Insertion Date/Time: 08/17/2016 3:59 PM Performed by: Candis Shine Pre-anesthesia Checklist: Patient identified, Emergency Drugs available, Suction available and Patient being monitored Patient Re-evaluated:Patient Re-evaluated prior to inductionOxygen Delivery Method: Circle System Utilized Preoxygenation: Pre-oxygenation with 100% oxygen Intubation Type: IV induction Ventilation: Mask ventilation without difficulty LMA: LMA inserted LMA Size: 4.0 Number of attempts: 1 Airway Equipment and Method: Bite block Placement Confirmation: positive ETCO2 Tube secured with: Tape Dental Injury: Teeth and Oropharynx as per pre-operative assessment

## 2016-08-17 NOTE — Progress Notes (Signed)
PROGRESS NOTE        PATIENT DETAILS Name: Todd [name] Age: 61 y.o. Sex: male Date of Birth: August 24, 1955 Admit Date: 08/08/2016 Admitting Physician Karmen Bongo, MD DTO:[email or web address], MD  Brief Narrative: Patient is a 61 y.o. male with history of hypothyroidism, hypertension, dyslipidemia, diabetes with more left than right leg gangrenous changes.  Subjective: When seen earlier this morning-lying comfortably in bed. Still had not made up his mind regarding whether or not to proceed with amputation today.  Assessment/Plan: Bilateral lower extremity critical leg ischemia with gangrenous changes: Followed closely by both orthopedics and vascular surgery during this hospital stay.Underwent vascular surgery evaluation with placement of a stent in the left superficial femoral artery. Subsequently underwent left BKA on 3/2. Vascular surgery then did a right lower extremity angiogram on 3/5, felt to have severe peripheral vascular disease without any revascularization options. Vascular surgery planning on her right BKA on 3/7.  AKI: Likely mild prerenal azotemia, resolved with supportive measures.  Type 2 diabetes: CBGs currently stable, continue Lantus 10 units daily and SSI. Follow CBGs  Hypothyroidism: Continue levothyroxine  Hypertension: Stable, continue Coreg  History of gout: continue colchicine  DVT Prophylaxis: Prophylactic Lovenox   Code Status: Full code   Family Communication: Family member at bedside  Disposition Plan: Remain inpatient-SNF on discharge  Antimicrobial agents: Anti-infectives    Start     Dose/Rate Route Frequency Ordered Stop   08/14/16 1300  vancomycin (VANCOCIN) IVPB 750 mg/150 ml premix  Status:  Discontinued     750 mg 150 mL/hr over 60 Minutes Intravenous Every 12 hours 08/14/16 1154 08/16/16 1422   08/12/16 0845  cefUROXime (ZINACEF) 1.5 g in dextrose 5 % 50 mL IVPB     1.5 g 100 mL/hr over 30 Minutes  Intravenous On call to O.R. 08/12/16 0830 08/13/16 0559   08/09/16 1600  vancomycin (VANCOCIN) IVPB 1000 mg/200 mL premix  Status:  Discontinued     1,000 mg 200 mL/hr over 60 Minutes Intravenous Every 24 hours 08/08/16 2153 08/14/16 1154   08/08/16 2200  cefTRIAXone (ROCEPHIN) 2 g in dextrose 5 % 50 mL IVPB  Status:  Discontinued     2 g 100 mL/hr over 30 Minutes Intravenous Every 24 hours 08/08/16 2056 08/16/16 1418   08/08/16 2200  metroNIDAZOLE (FLAGYL) tablet 500 mg  Status:  Discontinued     500 mg Oral Every 8 hours 08/08/16 2056 08/09/16 1554   08/08/16 1615  vancomycin (VANCOCIN) IVPB 1000 mg/200 mL premix     1,000 mg 200 mL/hr over 60 Minutes Intravenous  Once 08/08/16 1610 08/08/16 1801   08/08/16 1615  piperacillin-tazobactam (ZOSYN) IVPB 3.375 g     3.375 g 100 mL/hr over 30 Minutes Intravenous  Once 08/08/16 1610 08/08/16 1720      Procedures: 3/1>> 1.  US guided cannulation of right common femoral artery 2.  Aortogram with bilateral lower extremity runoff 3.  Drug coated balloon angioplasty of left sfa and popliteal arteries with 36mm impact admiral 4.  Moderate sedation for 75 minutes with versed and fentanyl  3/2>> Transtibial amputation Application of Prevena wound VAC Application of stump shrinker  3/5>> Right lower extremity arteriogram  CONSULTS:  orthopedic surgery and vascular surgery  Time spent: 25- minutes-Greater than 50% of this time was spent in counseling, explanation of diagnosis, planning of further management, and coordination of care.  MEDICATIONS: Scheduled Meds: . [MAR Hold] aspirin EC  325 mg Oral Daily  . [MAR Hold] carvedilol  3.125 mg Oral BID WC  . [MAR Hold] colchicine  0.6 mg Oral BID  . [MAR Hold] docusate sodium  100 mg Oral BID  . [MAR Hold] enoxaparin (LOVENOX) injection  40 mg Subcutaneous Q24H  . [MAR Hold] feeding supplement (GLUCERNA SHAKE)  237 mL Oral BID BM  . [MAR Hold] Gerhardt's butt cream   Topical QID  . [MAR  Hold] insulin aspart  0-15 Units Subcutaneous TID WC  . [MAR Hold] insulin glargine  10 Units Subcutaneous Daily  . [MAR Hold] levothyroxine  100 mcg Oral QAC breakfast  . [MAR Hold] pregabalin  200 mg Oral BID   Continuous Infusions: . sodium chloride Stopped (08/16/16 0236)  . sodium chloride 10 mL/hr at 08/16/16 0236  . lactated ringers 50 mL/hr at 08/17/16 1325   PRN Meds:.[MAR Hold] acetaminophen **OR** [MAR Hold] acetaminophen, [MAR Hold] bisacodyl, [MAR Hold] hydrALAZINE, [MAR Hold]  HYDROmorphone (DILAUDID) injection, [MAR Hold] labetalol, [MAR Hold] magnesium citrate, [MAR Hold] methocarbamol **OR** [MAR Hold] methocarbamol (ROBAXIN)  IV, [MAR Hold] metoCLOPramide **OR** [MAR Hold] metoCLOPramide (REGLAN) injection, [MAR Hold] metoprolol, [MAR Hold] ondansetron **OR** [MAR Hold] ondansetron (ZOFRAN) IV, [MAR Hold] oxyCODONE-acetaminophen, [MAR Hold] polyethylene glycol   PHYSICAL EXAM: Vital signs: Vitals:   08/16/16 1500 08/16/16 1900 08/17/16 0234 08/17/16 0558  BP: 140/60 124/78  101/62  Pulse: 90 90  80  Resp: 20 20  20   Temp: 99.7 F (37.6 C) (!) 101.3 F (38.5 C) 97.9 F (36.6 C) 98.2 F (36.8 C)  TempSrc: Oral Oral Oral Oral  SpO2: 100% 99%  100%  Weight:      Height:       Filed Weights   08/08/16 1600 08/08/16 2052 08/10/16 0600  Weight: 77.1 kg (170 lb) 70.9 kg (156 lb 4.8 oz) 69.7 kg (153 lb 9.6 oz)   Body mass index is 23.35 kg/m.   General appearance :Awake, alert, not in any distress. Speech Clear.  Eyes:, pupils equally reactive to light and accomodation,no scleral icterus. HEENT: Atraumatic and Normocephalic Neck: supple, no JVD. No cervical lymphadenopathy. Resp:Good air entry bilaterally, no added sounds  CVS: S1 S2 regular, no murmurs.  GI: Bowel sounds present, Non tender and not distended with no gaurding, rigidity or rebound Extremities: Left BKA-vac in place, right foot-dressing in place Neurology:  speech clear,Non focal, sensation is  grossly intact. Musculoskeletal:No digital cyanosis Skin:No Rash, warm and dry Wounds:N/A  I have personally reviewed following labs and imaging studies  LABORATORY DATA: CBC:  Recent Labs Lab 08/13/16 0332 08/14/16 0244 08/15/16 0423 08/16/16 0231 08/17/16 0635  WBC 19.6* 22.5* 23.0* 20.9* 18.3*  HGB 9.4* 10.1* 9.9* 9.4* 10.1*  HCT 28.9* 31.2* 30.1* 28.8* 30.9*  MCV 83.5 84.6 84.8 83.2 83.3  PLT 572* 616* 580* 590* 604*    Basic Metabolic Panel:  Recent Labs Lab 08/13/16 0332 08/14/16 0244 08/15/16 0423 08/16/16 0231 08/17/16 0635  NA 134* 137 134* 131* 137  K 3.3* 3.7 3.9 3.4* 4.0  CL 101 104 103 104 105  CO2 22 25 22  19* 22  GLUCOSE 117* 52* 81 175* 95  BUN 9 10 11 11 8   CREATININE 1.07 1.17 1.18 1.09 1.00  CALCIUM 7.2* 7.2* 7.2* 6.9* 7.5*    GFR: Estimated Creatinine Clearance: 75.1 mL/min (by C-G formula based on SCr of 1 mg/dL).  Liver Function Tests: No results for input(s): AST, ALT, ALKPHOS, BILITOT, PROT,  ALBUMIN in the last 168 hours. No results for input(s): LIPASE, AMYLASE in the last 168 hours. No results for input(s): AMMONIA in the last 168 hours.  Coagulation Profile: No results for input(s): INR, PROTIME in the last 168 hours.  Cardiac Enzymes: No results for input(s): CKTOTAL, CKMB, CKMBINDEX, TROPONINI in the last 168 hours.  BNP (last 3 results) No results for input(s): PROBNP in the last 8760 hours.  HbA1C: No results for input(s): HGBA1C in the last 72 hours.  CBG:  Recent Labs Lab 08/16/16 1634 08/16/16 2033 08/17/16 0618 08/17/16 0825 08/17/16 1142  GLUCAP 128* 118* 83 91 121*    Lipid Profile: No results for input(s): CHOL, HDL, LDLCALC, TRIG, CHOLHDL, LDLDIRECT in the last 72 hours.  Thyroid Function Tests: No results for input(s): TSH, T4TOTAL, FREET4, T3FREE, THYROIDAB in the last 72 hours.  Anemia Panel: No results for input(s): VITAMINB12, FOLATE, FERRITIN, TIBC, IRON, RETICCTPCT in the last 72  hours.  Urine analysis:    Component Value Date/Time   COLORURINE YELLOW 08/08/2016 1610   APPEARANCEUR CLEAR 08/08/2016 1610   APPEARANCEUR Clear 08/05/2016 1630   LABSPEC 1.011 08/08/2016 1610   PHURINE 5.0 08/08/2016 1610   GLUCOSEU NEGATIVE 08/08/2016 1610   HGBUR NEGATIVE 08/08/2016 1610   BILIRUBINUR NEGATIVE 08/08/2016 1610   BILIRUBINUR Negative 08/05/2016 1630   KETONESUR NEGATIVE 08/08/2016 1610   PROTEINUR NEGATIVE 08/08/2016 1610   NITRITE NEGATIVE 08/08/2016 1610   LEUKOCYTESUR NEGATIVE 08/08/2016 1610   LEUKOCYTESUR Negative 08/05/2016 1630    Sepsis Labs: Lactic Acid, Venous    Component Value Date/Time   LATICACIDVEN 1.2 08/09/2016 0004    MICROBIOLOGY: Recent Results (from the past 240 hour(s))  Blood Culture (routine x 2)     Status: None   Collection Time: 08/08/16  4:31 PM  Result Value Ref Range Status   Specimen Description BLOOD RIGHT HAND  Final   Special Requests BOTTLES DRAWN AEROBIC AND ANAEROBIC 6CC EACH  Final   Culture NO GROWTH 5 DAYS  Final   Report Status 08/13/2016 FINAL  Final  Blood Culture (routine x 2)     Status: None   Collection Time: 08/08/16  4:39 PM  Result Value Ref Range Status   Specimen Description BLOOD RIGHT HAND  Final   Special Requests BOTTLES DRAWN AEROBIC ONLY 6CC AEB  Final   Culture NO GROWTH 5 DAYS  Final   Report Status 08/13/2016 FINAL  Final  Surgical pcr screen     Status: None   Collection Time: 08/16/16 11:52 PM  Result Value Ref Range Status   MRSA, PCR NEGATIVE NEGATIVE Final   Staphylococcus aureus NEGATIVE NEGATIVE Final    Comment:        The Xpert SA Assay (FDA approved for NASAL specimens in patients over 18 years of age), is one component of a comprehensive surveillance program.  Test performance has been validated by [facility] for patients greater than or equal to 25 year old. It is not intended to diagnose infection nor to guide or monitor treatment.     RADIOLOGY  STUDIES/RESULTS: US Arterial Seg Multiple  Result Date: 08/09/2016 CLINICAL DATA:  Worsening right foot cellulitis. Left lower extremity ischemia with multiple ulcers. EXAM: NONINVASIVE PHYSIOLOGIC VASCULAR STUDY OF BILATERAL LOWER EXTREMITIES TECHNIQUE: Evaluation of both lower extremities was performed at rest, including calculation of ankle-brachial indices, multiple segmental pressure evaluation, segmental Doppler and segmental pulse volume recording. COMPARISON:  None. FINDINGS: Right ABI:  0.29 Left ABI: 0.00. Doppler signal not detected  in the left dorsalis pedis artery or left posterior tibial artery. Right Lower Extremity: Monophasic waveforms throughout the right lower extremity. Low amplitude PVR waveforms in the right lower extremity. Unable to detect signal in the right DP. Left Lower Extremity: There may be some biphasic and triphasic Doppler waveforms in the left femoral region. Monophasic waveforms in the left SFA. No significant signal in the left popliteal artery. No significant signal identified in the left posterior tibial artery or left dorsalis pedis artery. Abnormal PVR waveforms. Segmental pressures not obtained in the left upper leg. IMPRESSION: Left ABI could not be obtained. Left ankle arteries appear to be occluded. Severe occlusive arterial disease in the right lower extremity with ABI of 0.29. Electronically Signed   By: Markus Daft M.D.   On: 08/09/2016 16:59   Dg Chest Portable 1 View  Result Date: 08/08/2016 CLINICAL DATA:  Left foot swelling starting last night, open blisters today EXAM: PORTABLE CHEST 1 VIEW COMPARISON:  07/03/2016 FINDINGS: Cardiomediastinal silhouette is stable. No infiltrate or pleural effusion. No pulmonary edema. Bony thorax is unremarkable. IMPRESSION: No active disease. Electronically Signed   By: Lahoma Crocker M.D.   On: 08/08/2016 17:06   Dg Foot 2 Views Left  Result Date: 08/08/2016 CLINICAL DATA:  Left foot swelling last night, open blisters  today. History of diabetes, gout, hypertension. EXAM: LEFT FOOT - 2 VIEW COMPARISON:  Plain film of the left foot dated 07/03/2016. FINDINGS: Osseous alignment is stable. No acute or suspicious osseous finding. Again noted is a stable benign appearing chondroid lesion versus old bone infarct within the lower right tibia. Soft tissues about the right foot and ankle are unremarkable. IMPRESSION: No acute findings. Electronically Signed   By: Franki Cabot M.D.   On: 08/08/2016 17:10     LOS: 9 days   Oren Binet, MD  [facility] Pager:[phone]  If 7PM-7AM, please contact night-coverage [email or web address] Password [email or web address] 08/17/2016, 2:05 PM

## 2016-08-17 NOTE — Anesthesia Preprocedure Evaluation (Signed)
Anesthesia Evaluation  Patient identified by MRN, date of birth, ID band Patient awake    Reviewed: Allergy & Precautions, NPO status , Patient's Chart, lab work & pertinent test results  Airway Mallampati: II  TM Distance: >3 FB Neck ROM: Full    Dental no notable dental hx.    Pulmonary neg pulmonary ROS, Current Smoker,    Pulmonary exam normal breath sounds clear to auscultation       Cardiovascular hypertension, negative cardio ROS Normal cardiovascular exam Rhythm:Regular Rate:Normal     Neuro/Psych negative neurological ROS  negative psych ROS   GI/Hepatic negative GI ROS, Neg liver ROS,   Endo/Other  negative endocrine ROSdiabetesHypothyroidism   Renal/GU ARFRenal diseasenegative Renal ROS  negative genitourinary   Musculoskeletal negative musculoskeletal ROS (+)   Abdominal   Peds negative pediatric ROS (+)  Hematology negative hematology ROS (+)   Anesthesia Other Findings   Reproductive/Obstetrics negative OB ROS                             Anesthesia Physical Anesthesia Plan  ASA: II  Anesthesia Plan: General   Post-op Pain Management:    Induction: Intravenous  Airway Management Planned: Oral ETT  Additional Equipment:   Intra-op Plan:   Post-operative Plan: Extubation in OR  Informed Consent: I have reviewed the patients History and Physical, chart, labs and discussed the procedure including the risks, benefits and alternatives for the proposed anesthesia with the patient or authorized representative who has indicated his/her understanding and acceptance.   Dental advisory given  Plan Discussed with: CRNA and Surgeon  Anesthesia Plan Comments:         Anesthesia Quick Evaluation

## 2016-08-17 NOTE — Transfer of Care (Signed)
Immediate Anesthesia Transfer of Care Note  Patient: Todd [name]  Procedure(s) Performed: Procedure(s): RIGHT BELOW KNEE AMPUTATION (Right)  Patient Location: PACU  Anesthesia Type:General  Level of Consciousness: awake, alert  and oriented  Airway & Oxygen Therapy: Patient Spontanous Breathing and Patient connected to nasal cannula oxygen  Post-op Assessment: Report given to RN, Post -op Vital signs reviewed and stable and Patient moving all extremities  Post vital signs: Reviewed and stable  Last Vitals:  Vitals:   08/17/16 0234 08/17/16 0558  BP:  101/62  Pulse:  80  Resp:  20  Temp: 36.6 C 36.8 C    Last Pain:  Vitals:   08/17/16 0558  TempSrc: Oral  PainSc:       Patients Stated Pain Goal: 3 (46/80/32 1224)  Complications: No apparent anesthesia complications

## 2016-08-17 NOTE — H&P (View-Only) (Signed)
[facility] Progress Note  Subjective  No complaints.   Objective Vitals:   08/15/16 2015 08/16/16 0417  BP: 137/77 105/70  Pulse: 86 82  Resp: 18 18  Temp: 100.1 F (37.8 C) 99.3 F (37.4 C)    Intake/Output Summary (Last 24 hours) at 08/16/16 0938 Last data filed at 08/16/16 0830  Gross per 24 hour  Intake             2796 ml  Output              675 ml  Net             2121 ml   Left groin without hematoma.  Left BKA with VAC Right foot with dry gangrene 4th and 5th toes. No drainage. Blister ruptured to dorsum right foot. Skin appears healthy at base of blister.   Assessment/Planning: 61 y.o. male is s/p: right lower extremity arteriogram 1 Day Post-Op   No options for revascularization right foot. Discussed right below-knee amputation versus conservative management with wound care. Also talked to sister on the phone. Patient and family are not ready to make decision yet but am leaning towards conservative management. Discussed that wound care is reasonable at this time given that his right foot does not appear infected. If his foot does worsen, he understands that he would need an amputation.   Alvia [place] 08/16/2016 9:38 AM -- Spoke with patent and his nephew regarding right below knee amputation.  Discussed with pt that pain will get worse over time and minimal chance of foot healing.  He currently is undecided on whether to proceed with amputation.  Pt nephew was present for conversation at pt request.  They will discuss further and try to make decision.  Will make NPO post midnight for BKA tomorrow.  If pt decides not to proceed he may eat and could go home from our standpoint.  I would not plan for narcotic management of this patient if he opts for home as this is only delaying definitive treatment unless he prefers palliative care with no intention of ever proceeding with BKA  Ruta Hinds, MD [facility]  [place]: [phone] Pager: [phone]  Laboratory CBC    Component Value Date/Time   WBC 20.9 (H) 08/16/2016 0231   HGB 9.4 (L) 08/16/2016 0231   HCT 28.8 (L) 08/16/2016 0231   HCT 36.1 (L) 08/05/2016 1630   PLT 590 (H) 08/16/2016 0231   PLT 793 (H) 08/05/2016 1630    BMET    Component Value Date/Time   NA 131 (L) 08/16/2016 0231   NA 136 08/03/2016 1119   K 3.4 (L) 08/16/2016 0231   CL 104 08/16/2016 0231   CO2 19 (L) 08/16/2016 0231   GLUCOSE 175 (H) 08/16/2016 0231   BUN 11 08/16/2016 0231   BUN 30 (H) 08/03/2016 1119   CREATININE 1.09 08/16/2016 0231   CALCIUM 6.9 (L) 08/16/2016 0231   GFRNONAA >60 08/16/2016 0231   GFRAA >60 08/16/2016 0231    COAG Lab Results  Component Value Date   INR 1.11 08/08/2016   No results found for: PTT  Antibiotics Anti-infectives    Start     Dose/Rate Route Frequency Ordered Stop   08/14/16 1300  vancomycin (VANCOCIN) IVPB 750 mg/150 ml premix     750 mg 150 mL/hr over 60 Minutes Intravenous Every 12 hours 08/14/16 1154     08/12/16 0845  cefUROXime (ZINACEF) 1.5 g in dextrose  5 % 50 mL IVPB     1.5 g 100 mL/hr over 30 Minutes Intravenous On call to O.R. 08/12/16 0830 08/13/16 0559   08/09/16 1600  vancomycin (VANCOCIN) IVPB 1000 mg/200 mL premix  Status:  Discontinued     1,000 mg 200 mL/hr over 60 Minutes Intravenous Every 24 hours 08/08/16 2153 08/14/16 1154   08/08/16 2200  cefTRIAXone (ROCEPHIN) 2 g in dextrose 5 % 50 mL IVPB     2 g 100 mL/hr over 30 Minutes Intravenous Every 24 hours 08/08/16 2056     08/08/16 2200  metroNIDAZOLE (FLAGYL) tablet 500 mg  Status:  Discontinued     500 mg Oral Every 8 hours 08/08/16 2056 08/09/16 1554   08/08/16 1615  vancomycin (VANCOCIN) IVPB 1000 mg/200 mL premix     1,000 mg 200 mL/hr over 60 Minutes Intravenous  Once 08/08/16 1610 08/08/16 1801   08/08/16 1615  piperacillin-tazobactam (ZOSYN) IVPB 3.375 g     3.375 g 100 mL/hr over 30 Minutes Intravenous  Once  08/08/16 1610 08/08/16 [place], PA-C [facility] Office: [phone] Pager: [phone] 08/16/2016 9:38 AM

## 2016-08-18 ENCOUNTER — Ambulatory Visit: Payer: BLUE CROSS/BLUE SHIELD | Admitting: Neurology

## 2016-08-18 ENCOUNTER — Encounter (HOSPITAL_COMMUNITY): Payer: Self-pay | Admitting: Vascular Surgery

## 2016-08-18 LAB — CBC
HCT: 29.5 % — ABNORMAL LOW (ref 39.0–52.0)
HEMOGLOBIN: 9.7 g/dL — AB (ref 13.0–17.0)
MCH: 27.3 pg (ref 26.0–34.0)
MCHC: 32.9 g/dL (ref 30.0–36.0)
MCV: 83.1 fL (ref 78.0–100.0)
Platelets: 600 10*3/uL — ABNORMAL HIGH (ref 150–400)
RBC: 3.55 MIL/uL — ABNORMAL LOW (ref 4.22–5.81)
RDW: 14 % (ref 11.5–15.5)
WBC: 16.9 10*3/uL — AB (ref 4.0–10.5)

## 2016-08-18 LAB — BASIC METABOLIC PANEL
ANION GAP: 10 (ref 5–15)
BUN: 9 mg/dL (ref 6–20)
CALCIUM: 7.3 mg/dL — AB (ref 8.9–10.3)
CO2: 20 mmol/L — ABNORMAL LOW (ref 22–32)
Chloride: 103 mmol/L (ref 101–111)
Creatinine, Ser: 0.86 mg/dL (ref 0.61–1.24)
GLUCOSE: 97 mg/dL (ref 65–99)
Potassium: 3.7 mmol/L (ref 3.5–5.1)
SODIUM: 133 mmol/L — AB (ref 135–145)

## 2016-08-18 LAB — GLUCOSE, CAPILLARY
GLUCOSE-CAPILLARY: 106 mg/dL — AB (ref 65–99)
Glucose-Capillary: 107 mg/dL — ABNORMAL HIGH (ref 65–99)
Glucose-Capillary: 120 mg/dL — ABNORMAL HIGH (ref 65–99)

## 2016-08-18 NOTE — Progress Notes (Signed)
  [facility] Progress Note  Subjective  - POD #1  No complaints.   Objective Vitals:   08/17/16 1837 08/18/16 0454  BP: 129/66 (!) 107/59  Pulse: 76 80  Resp: 19 18  Temp:  99.7 F (37.6 C)    Intake/Output Summary (Last 24 hours) at 08/18/16 0758 Last data filed at 08/17/16 1811  Gross per 24 hour  Intake              800 ml  Output              100 ml  Net              700 ml    Right BKA dressing clean and dry. Left BKA with VAC intact.  Assessment/Planning: 61 y.o. [name] is s/p: right BKA 1 Day Post-Op   WBC trending down.  Will take down dressing right BKA tomorrow.   Alvia Grove 08/18/2016 7:58 AM --  Laboratory CBC    Component Value Date/Time   WBC 16.9 (H) 08/18/2016 0536   HGB 9.7 (L) 08/18/2016 0536   HCT 29.5 (L) 08/18/2016 0536   HCT 36.1 (L) 08/05/2016 1630   PLT 600 (H) 08/18/2016 0536   PLT 793 (H) 08/05/2016 1630    BMET    Component Value Date/Time   NA 133 (L) 08/18/2016 0536   NA 136 08/03/2016 1119   K 3.7 08/18/2016 0536   CL 103 08/18/2016 0536   CO2 20 (L) 08/18/2016 0536   GLUCOSE 97 08/18/2016 0536   BUN 9 08/18/2016 0536   BUN 30 (H) 08/03/2016 1119   CREATININE 0.86 08/18/2016 0536   CALCIUM 7.3 (L) 08/18/2016 0536   GFRNONAA >60 08/18/2016 0536   GFRAA >60 08/18/2016 0536    COAG Lab Results  Component Value Date   INR 1.11 08/08/2016   No results found for: PTT  Antibiotics Anti-infectives    Start     Dose/Rate Route Frequency Ordered Stop   08/14/16 1300  vancomycin (VANCOCIN) IVPB 750 mg/150 ml premix  Status:  Discontinued     750 mg 150 mL/hr over 60 Minutes Intravenous Every 12 hours 08/14/16 1154 08/16/16 1422   08/12/16 0845  cefUROXime (ZINACEF) 1.5 g in dextrose 5 % 50 mL IVPB     1.5 g 100 mL/hr over 30 Minutes Intravenous On call to O.R. 08/12/16 0830 08/13/16 0559   08/09/16 1600  vancomycin (VANCOCIN) IVPB 1000 mg/200 mL premix  Status:  Discontinued     1,000 mg 200  mL/hr over 60 Minutes Intravenous Every 24 hours 08/08/16 2153 08/14/16 1154   08/08/16 2200  cefTRIAXone (ROCEPHIN) 2 g in dextrose 5 % 50 mL IVPB  Status:  Discontinued     2 g 100 mL/hr over 30 Minutes Intravenous Every 24 hours 08/08/16 2056 08/16/16 1418   08/08/16 2200  metroNIDAZOLE (FLAGYL) tablet 500 mg  Status:  Discontinued     500 mg Oral Every 8 hours 08/08/16 2056 08/09/16 1554   08/08/16 1615  vancomycin (VANCOCIN) IVPB 1000 mg/200 mL premix     1,000 mg 200 mL/hr over 60 Minutes Intravenous  Once 08/08/16 1610 08/08/16 1801   08/08/16 1615  piperacillin-tazobactam (ZOSYN) IVPB 3.375 g     3.375 g 100 mL/hr over 30 Minutes Intravenous  Once 08/08/16 1610 08/08/16 [place], PA-C [facility] Office: [phone] Pager: [phone] 08/18/2016 7:58 AM

## 2016-08-18 NOTE — Progress Notes (Signed)
[facility] [place]  Subjective  - moderate pain   Objective (!) 107/59 80 99.7 F (37.6 C) (Oral) 18 100%  Intake/Output Summary (Last 24 hours) at 08/18/16 0801 Last data filed at 08/17/16 1811  Gross per 24 hour  Intake              800 ml  Output              100 ml  Net              700 ml   Dressing dry  Assessment/Planning: S/p bilat bka PT OT Rehab vs SNF  Ruta Hinds 08/18/2016 8:01 AM --  Laboratory Lab Results:  Recent Labs  08/17/16 0635 08/18/16 0536  WBC 18.3* 16.9*  HGB 10.1* 9.7*  HCT 30.9* 29.5*  PLT 604* 600*   BMET  Recent Labs  08/17/16 0635 08/18/16 0536  NA 137 133*  K 4.0 3.7  CL 105 103  CO2 22 20*  GLUCOSE 95 97  BUN 8 9  CREATININE 1.00 0.86  CALCIUM 7.5* 7.3*    COAG Lab Results  Component Value Date   INR 1.11 08/08/2016   No results found for: PTT

## 2016-08-18 NOTE — Anesthesia Postprocedure Evaluation (Signed)
Anesthesia Post Note  Patient: Todd [name]  Procedure(s) Performed: Procedure(s) (LRB): RIGHT BELOW KNEE AMPUTATION (Right)  Patient location during evaluation: PACU Anesthesia Type: General Level of consciousness: awake and alert Pain management: pain level controlled Vital Signs Assessment: post-procedure vital signs reviewed and stable Respiratory status: spontaneous breathing, nonlabored ventilation, respiratory function stable and patient connected to nasal cannula oxygen Cardiovascular status: blood pressure returned to baseline and stable Postop Assessment: no signs of nausea or vomiting Anesthetic complications: no       Last Vitals:  Vitals:   08/17/16 1837 08/18/16 0454  BP: 129/66 (!) 107/59  Pulse: 76 80  Resp: 19 18  Temp:  37.6 C    Last Pain:  Vitals:   08/18/16 0900  TempSrc:   PainSc: [place]

## 2016-08-18 NOTE — Progress Notes (Signed)
PROGRESS NOTE        PATIENT DETAILS Name: Todd [name] Age: 61 y.o. Sex: male Date of Birth: Oct 26, 1955 Admit Date: 08/08/2016 Admitting Physician Karmen Bongo, MD GNF:AOZHYQ,MVHQIO, MD  Brief Narrative: Patient is a 61 y.o. male with history of hypothyroidism, hypertension, dyslipidemia, diabetes with more left than right leg gangrenous changes. Evaluated by both vascular and orthopedic surgery, underwent left BKA on 3/2, and subsequently a right BKA on 3/7. See below for further details  Subjective: Sitting up at bedside chair-apart from pain at the stump site. No other issues. Denies any shortness of breath or chest pain.  Assessment/Plan: Bilateral lower extremity critical leg ischemia with gangrenous changes: Followed closely by both orthopedics and vascular surgery during this hospital stay.Underwent vascular surgery evaluation with placement of a stent in the left superficial femoral artery. Subsequently underwent left BKA on 3/2. Vascular surgery then did a right lower extremity angiogram on 3/5, felt to have severe peripheral vascular disease without any revascularization options. Vascular surgery subsequently performed a right BKA on 3/7.  Leukocytosis: Downtrending, probably reactive secondary to gangrene. No longer on antimicrobial therapy.  AKI: Likely mild prerenal azotemia, resolved with supportive measures.  Type 2 diabetes: CBGs currently stable, continue Lantus 10 units daily and SSI. Follow CBGs  Hypothyroidism: Continue levothyroxine  Hypertension: Stable, continue Coreg  History of gout: continue colchicine  DVT Prophylaxis: Prophylactic Lovenox   Code Status: Full code   Family Communication: None at bedside  Disposition Plan: Remain inpatient-SNF on discharge  Antimicrobial agents: Anti-infectives    Start     Dose/Rate Route Frequency Ordered Stop   08/14/16 1300  vancomycin (VANCOCIN) IVPB 750 mg/150 ml premix   Status:  Discontinued     750 mg 150 mL/hr over 60 Minutes Intravenous Every 12 hours 08/14/16 1154 08/16/16 1422   08/12/16 0845  cefUROXime (ZINACEF) 1.5 g in dextrose 5 % 50 mL IVPB     1.5 g 100 mL/hr over 30 Minutes Intravenous On call to O.R. 08/12/16 0830 08/13/16 0559   08/09/16 1600  vancomycin (VANCOCIN) IVPB 1000 mg/200 mL premix  Status:  Discontinued     1,000 mg 200 mL/hr over 60 Minutes Intravenous Every 24 hours 08/08/16 2153 08/14/16 1154   08/08/16 2200  cefTRIAXone (ROCEPHIN) 2 g in dextrose 5 % 50 mL IVPB  Status:  Discontinued     2 g 100 mL/hr over 30 Minutes Intravenous Every 24 hours 08/08/16 2056 08/16/16 1418   08/08/16 2200  metroNIDAZOLE (FLAGYL) tablet 500 mg  Status:  Discontinued     500 mg Oral Every 8 hours 08/08/16 2056 08/09/16 1554   08/08/16 1615  vancomycin (VANCOCIN) IVPB 1000 mg/200 mL premix     1,000 mg 200 mL/hr over 60 Minutes Intravenous  Once 08/08/16 1610 08/08/16 1801   08/08/16 1615  piperacillin-tazobactam (ZOSYN) IVPB 3.375 g     3.375 g 100 mL/hr over 30 Minutes Intravenous  Once 08/08/16 1610 08/08/16 1720      Procedures: 3/1>> 1.  US guided cannulation of right common femoral artery 2.  Aortogram with bilateral lower extremity runoff 3.  Drug coated balloon angioplasty of left sfa and popliteal arteries with 108mm impact admiral 4.  Moderate sedation for 75 minutes with versed and fentanyl  3/2>> Transtibial amputation Application of Prevena wound VAC Application of stump shrinker  3/5>> Right lower extremity  arteriogram  CONSULTS:  orthopedic surgery and vascular surgery  Time spent: 25- minutes-Greater than 50% of this time was spent in counseling, explanation of diagnosis, planning of further management, and coordination of care.  MEDICATIONS: Scheduled Meds: . aspirin EC  325 mg Oral Daily  . carvedilol  3.125 mg Oral BID WC  . colchicine  0.6 mg Oral BID  . docusate sodium  100 mg Oral BID  . enoxaparin  (LOVENOX) injection  40 mg Subcutaneous Q24H  . feeding supplement (GLUCERNA SHAKE)  237 mL Oral BID BM  . Gerhardt's butt cream   Topical QID  . insulin aspart  0-15 Units Subcutaneous TID WC  . insulin glargine  10 Units Subcutaneous Daily  . levothyroxine  100 mcg Oral QAC breakfast  . pregabalin  200 mg Oral BID   Continuous Infusions: . sodium chloride Stopped (08/16/16 0236)  . sodium chloride 10 mL/hr at 08/16/16 0236  . lactated ringers 50 mL/hr at 08/18/16 0111   PRN Meds:.acetaminophen **OR** acetaminophen, bisacodyl, hydrALAZINE, HYDROmorphone (DILAUDID) injection, labetalol, magnesium citrate, methocarbamol **OR** methocarbamol (ROBAXIN)  IV, metoCLOPramide **OR** metoCLOPramide (REGLAN) injection, metoprolol, ondansetron **OR** ondansetron (ZOFRAN) IV, oxyCODONE-acetaminophen, polyethylene glycol   PHYSICAL EXAM: Vital signs: Vitals:   08/17/16 1800 08/17/16 1807 08/17/16 1837 08/18/16 0454  BP:  (!) 102/49 129/66 (!) 107/59  Pulse: 78 80 76 80  Resp: 20 (!) 21 19 18   Temp:  97.7 F (36.5 C)  99.7 F (37.6 C)  TempSrc:    Oral  SpO2: 99% 100% 100% 100%  Weight:      Height:       Filed Weights   08/08/16 1600 08/08/16 2052 08/10/16 0600  Weight: 77.1 kg (170 lb) 70.9 kg (156 lb 4.8 oz) 69.7 kg (153 lb 9.6 oz)   Body mass index is 23.35 kg/m.   General appearance :Awake, alert, not in any distress. Speech Clear.  Eyes:, pupils equally reactive to light and accomodation,no scleral icterus. HEENT: Atraumatic and Normocephalic Neck: supple, no JVD. No cervical lymphadenopathy. Resp:Good air entry bilaterally, no added sounds  CVS: S1 S2 regular, no murmurs.  GI: Bowel sounds present, Non tender and not distended with no gaurding, rigidity or rebound Extremities: LeftAnd right BKA Neurology:  speech clear,Non focal, sensation is grossly intact. Musculoskeletal:No digital cyanosis Skin:No Rash, warm and dry Wounds:N/A  I have personally reviewed following  labs and imaging studies  LABORATORY DATA: CBC:  Recent Labs Lab 08/14/16 0244 08/15/16 0423 08/16/16 0231 08/17/16 0635 08/18/16 0536  WBC 22.5* 23.0* 20.9* 18.3* 16.9*  HGB 10.1* 9.9* 9.4* 10.1* 9.7*  HCT 31.2* 30.1* 28.8* 30.9* 29.5*  MCV 84.6 84.8 83.2 83.3 83.1  PLT 616* 580* 590* 604* 600*    Basic Metabolic Panel:  Recent Labs Lab 08/14/16 0244 08/15/16 0423 08/16/16 0231 08/17/16 0635 08/18/16 0536  NA 137 134* 131* 137 133*  K 3.7 3.9 3.4* 4.0 3.7  CL 104 103 104 105 103  CO2 25 22 19* 22 20*  GLUCOSE 52* 81 175* 95 97  BUN 10 11 11 8 9   CREATININE 1.17 1.18 1.09 1.00 0.86  CALCIUM 7.2* 7.2* 6.9* 7.5* 7.3*    GFR: Estimated Creatinine Clearance: 87.3 mL/min (by C-G formula based on SCr of 0.86 mg/dL).  Liver Function Tests: No results for input(s): AST, ALT, ALKPHOS, BILITOT, PROT, ALBUMIN in the last 168 hours. No results for input(s): LIPASE, AMYLASE in the last 168 hours. No results for input(s): AMMONIA in the last 168 hours.  Coagulation  Profile: No results for input(s): INR, PROTIME in the last 168 hours.  Cardiac Enzymes: No results for input(s): CKTOTAL, CKMB, CKMBINDEX, TROPONINI in the last 168 hours.  BNP (last 3 results) No results for input(s): PROBNP in the last 8760 hours.  HbA1C: No results for input(s): HGBA1C in the last 72 hours.  CBG:  Recent Labs Lab 08/16/16 1634 08/16/16 2033 08/17/16 0618 08/17/16 0825 08/17/16 1142  GLUCAP 128* 118* 83 91 121*    Lipid Profile: No results for input(s): CHOL, HDL, LDLCALC, TRIG, CHOLHDL, LDLDIRECT in the last 72 hours.  Thyroid Function Tests: No results for input(s): TSH, T4TOTAL, FREET4, T3FREE, THYROIDAB in the last 72 hours.  Anemia Panel: No results for input(s): VITAMINB12, FOLATE, FERRITIN, TIBC, IRON, RETICCTPCT in the last 72 hours.  Urine analysis:    Component Value Date/Time   COLORURINE YELLOW 08/08/2016 1610   APPEARANCEUR CLEAR 08/08/2016 1610    APPEARANCEUR Clear 08/05/2016 1630   LABSPEC 1.011 08/08/2016 1610   PHURINE 5.0 08/08/2016 1610   GLUCOSEU NEGATIVE 08/08/2016 1610   HGBUR NEGATIVE 08/08/2016 1610   BILIRUBINUR NEGATIVE 08/08/2016 1610   BILIRUBINUR Negative 08/05/2016 1630   KETONESUR NEGATIVE 08/08/2016 1610   PROTEINUR NEGATIVE 08/08/2016 1610   NITRITE NEGATIVE 08/08/2016 1610   LEUKOCYTESUR NEGATIVE 08/08/2016 1610   LEUKOCYTESUR Negative 08/05/2016 1630    Sepsis Labs: Lactic Acid, Venous    Component Value Date/Time   LATICACIDVEN 1.2 08/09/2016 0004    MICROBIOLOGY: Recent Results (from the past 240 hour(s))  Blood Culture (routine x 2)     Status: None   Collection Time: 08/08/16  4:31 PM  Result Value Ref Range Status   Specimen Description BLOOD RIGHT HAND  Final   Special Requests BOTTLES DRAWN AEROBIC AND ANAEROBIC 6CC EACH  Final   Culture NO GROWTH 5 DAYS  Final   Report Status 08/13/2016 FINAL  Final  Blood Culture (routine x 2)     Status: None   Collection Time: 08/08/16  4:39 PM  Result Value Ref Range Status   Specimen Description BLOOD RIGHT HAND  Final   Special Requests BOTTLES DRAWN AEROBIC ONLY 6CC AEB  Final   Culture NO GROWTH 5 DAYS  Final   Report Status 08/13/2016 FINAL  Final  Surgical pcr screen     Status: None   Collection Time: 08/16/16 11:52 PM  Result Value Ref Range Status   MRSA, PCR NEGATIVE NEGATIVE Final   Staphylococcus aureus NEGATIVE NEGATIVE Final    Comment:        The Xpert SA Assay (FDA approved for NASAL specimens in patients over 10 years of age), is one component of a comprehensive surveillance program.  Test performance has been validated by [facility] for patients greater than or equal to 28 year old. It is not intended to diagnose infection nor to guide or monitor treatment.     RADIOLOGY STUDIES/RESULTS: US Arterial Seg Multiple  Result Date: 08/09/2016 CLINICAL DATA:  Worsening right foot cellulitis. Left lower extremity ischemia  with multiple ulcers. EXAM: NONINVASIVE PHYSIOLOGIC VASCULAR STUDY OF BILATERAL LOWER EXTREMITIES TECHNIQUE: Evaluation of both lower extremities was performed at rest, including calculation of ankle-brachial indices, multiple segmental pressure evaluation, segmental Doppler and segmental pulse volume recording. COMPARISON:  None. FINDINGS: Right ABI:  0.29 Left ABI: 0.00. Doppler signal not detected in the left dorsalis pedis artery or left posterior tibial artery. Right Lower Extremity: Monophasic waveforms throughout the right lower extremity. Low amplitude PVR waveforms in the right lower  extremity. Unable to detect signal in the right DP. Left Lower Extremity: There may be some biphasic and triphasic Doppler waveforms in the left femoral region. Monophasic waveforms in the left SFA. No significant signal in the left popliteal artery. No significant signal identified in the left posterior tibial artery or left dorsalis pedis artery. Abnormal PVR waveforms. Segmental pressures not obtained in the left upper leg. IMPRESSION: Left ABI could not be obtained. Left ankle arteries appear to be occluded. Severe occlusive arterial disease in the right lower extremity with ABI of 0.29. Electronically Signed   By: Markus Daft M.D.   On: 08/09/2016 16:59   Dg Chest Portable 1 View  Result Date: 08/08/2016 CLINICAL DATA:  Left foot swelling starting last night, open blisters today EXAM: PORTABLE CHEST 1 VIEW COMPARISON:  07/03/2016 FINDINGS: Cardiomediastinal silhouette is stable. No infiltrate or pleural effusion. No pulmonary edema. Bony thorax is unremarkable. IMPRESSION: No active disease. Electronically Signed   By: Lahoma Crocker M.D.   On: 08/08/2016 17:06   Dg Foot 2 Views Left  Result Date: 08/08/2016 CLINICAL DATA:  Left foot swelling last night, open blisters today. History of diabetes, gout, hypertension. EXAM: LEFT FOOT - 2 VIEW COMPARISON:  Plain film of the left foot dated 07/03/2016. FINDINGS: Osseous  alignment is stable. No acute or suspicious osseous finding. Again noted is a stable benign appearing chondroid lesion versus old bone infarct within the lower right tibia. Soft tissues about the right foot and ankle are unremarkable. IMPRESSION: No acute findings. Electronically Signed   By: Franki Cabot M.D.   On: 08/08/2016 17:10     LOS: 10 days   Oren Binet, MD  [facility] Pager:[phone]  If 7PM-7AM, please contact night-coverage [email or web address] Password [email or web address] 08/18/2016, 11:04 AM

## 2016-08-18 NOTE — NC FL2 (Signed)
[place] LEVEL OF CARE SCREENING TOOL     IDENTIFICATION  Patient Name: Todd [name] Birthdate: [birth year] Sex: male Admission Date (Current Location): 08/08/2016  [facility] and [place] Number:  Herbalist and Address:  [facility], [place] [address], [place], [place] [address]      Provider Number: [id number]  Attending Physician Name and Address:  Jonetta Osgood, MD  Relative Name and Phone Number:  Yaqub Arney, [phone]    Current Level of Care: Hospital Recommended Level of Care: [place] Prior Approval Number:    Date Approved/Denied:   PASRR Number: [id number]  Discharge Plan: SNF    Current Diagnoses: Patient Active Problem List   Diagnosis Date Noted  . Pressure injury of skin 08/15/2016  . Gangrene associated with diabetes mellitus ([place]) 08/08/2016  . Diabetic peripheral neuropathy ([place]) 07/21/2016  . Renal failure 07/04/2016  . Gout 07/04/2016  . Elevated troponin 07/04/2016  . Cellulitis feet due to stasis dermatitis & edema 07/04/2016  . Hyperuricemia 06/21/2016  . HTN (hypertension) 01/05/2016  . Diabetes type 2, uncontrolled ([place]) 01/05/2016  . AKI (acute kidney injury) ([place]) 12/20/2015  . Hypothyroidism 05/10/2011    Orientation RESPIRATION BLADDER Height & Weight     Self, Time, Situation, Place  Normal Incontinent Weight: 153 lb 9.6 oz (69.7 kg) Height:  5\' 8"  (172.7 cm)  BEHAVIORAL SYMPTOMS/MOOD NEUROLOGICAL BOWEL NUTRITION STATUS      Incontinent Diet (Heart healthy )  AMBULATORY STATUS COMMUNICATION OF NEEDS Skin   Extensive Assist Verbally Normal                       Personal Care Assistance Level of Assistance  Bathing, Feeding, Dressing Bathing Assistance: Limited assistance Feeding assistance: Independent Dressing Assistance: Limited assistance     Functional Limitations Info  Sight, Hearing, Speech Sight Info: Adequate Hearing Info:  Adequate Speech Info: Adequate    SPECIAL CARE FACTORS FREQUENCY  PT (By licensed PT), OT (By licensed OT)     PT Frequency: 5x week OT Frequency: 5x week            Contractures Contractures Info: Not present    Additional Factors Info  Code Status Code Status Info: Full Code             Current Medications (08/18/2016):  This is the current hospital active medication list Current Facility-Administered Medications  Medication Dose Route Frequency Provider Last Rate Last Dose  . acetaminophen (TYLENOL) tablet 650 mg  650 mg Oral Q6H PRN Karmen Bongo, MD   650 mg at 08/18/16 1551   Or  . acetaminophen (TYLENOL) suppository 650 mg  650 mg Rectal Q6H PRN Karmen Bongo, MD      . aspirin EC tablet 325 mg  325 mg Oral Daily Waynetta Sandy, MD   325 mg at 08/18/16 0941  . bisacodyl (DULCOLAX) suppository 10 mg  10 mg Rectal Daily PRN [name] V, MD      . carvedilol (COREG) tablet 3.125 mg  3.125 mg Oral BID WC Karmen Bongo, MD   3.125 mg at 08/18/16 1731  . colchicine tablet 0.6 mg  0.6 mg Oral BID Karmen Bongo, MD   0.6 mg at 08/18/16 0940  . docusate sodium (COLACE) capsule 100 mg  100 mg Oral BID Newt Minion, MD   100 mg at 08/18/16 0941  . enoxaparin (LOVENOX) injection 40 mg  40 mg Subcutaneous Q24H Karmen Bongo,  MD   40 mg at 08/17/16 2147  . feeding supplement (GLUCERNA SHAKE) (GLUCERNA SHAKE) liquid 237 mL  237 mL Oral BID BM Estela Leonie Green, MD   237 mL at 08/18/16 1400  . Gerhardt's butt cream   Topical QID Jeryl Columbia, NP      . hydrALAZINE (APRESOLINE) injection 5 mg  5 mg Intravenous Q20 Min PRN Elam Dutch, MD      . HYDROmorphone (DILAUDID) injection 1 mg  1 mg Intravenous Q2H PRN Newt Minion, MD   1 mg at 08/15/16 0538  . insulin aspart (novoLOG) injection 0-15 Units  0-15 Units Subcutaneous TID WC Karmen Bongo, MD   2 Units at 08/15/16 1720  . insulin glargine (LANTUS) injection 10 Units  10 Units Subcutaneous  Daily Theodis Blaze, MD   10 Units at 08/18/16 [phone]  . labetalol (NORMODYNE,TRANDATE) injection 10 mg  10 mg Intravenous Q10 min PRN Elam Dutch, MD      . lactated ringers infusion   Intravenous Continuous Elam Dutch, MD 50 mL/hr at 08/18/16 0111    . levothyroxine (SYNTHROID, LEVOTHROID) tablet 100 mcg  100 mcg Oral QAC breakfast Karmen Bongo, MD   100 mcg at 08/17/16 0520  . magnesium citrate solution 1 Bottle  1 Bottle Oral Once PRN Newt Minion, MD      . methocarbamol (ROBAXIN) tablet 500 mg  500 mg Oral Q6H PRN Newt Minion, MD       Or  . methocarbamol (ROBAXIN) 500 mg in dextrose 5 % 50 mL IVPB  500 mg Intravenous Q6H PRN Newt Minion, MD      . metoCLOPramide (REGLAN) tablet 5-10 mg  5-10 mg Oral Q8H PRN Newt Minion, MD       Or  . metoCLOPramide (REGLAN) injection 5-10 mg  5-10 mg Intravenous Q8H PRN Newt Minion, MD      . metoprolol (LOPRESSOR) injection 2-5 mg  2-5 mg Intravenous Q2H PRN Elam Dutch, MD      . ondansetron [facility]) tablet 4 mg  4 mg Oral Q6H PRN Karmen Bongo, MD       Or  . ondansetron [facility]) injection 4 mg  4 mg Intravenous Q6H PRN Karmen Bongo, MD      . oxyCODONE-acetaminophen (PERCOCET/ROXICET) 5-325 MG per tablet 1-2 tablet  1-2 tablet Oral Q3H PRN Waynetta Sandy, MD   1 tablet at 08/18/16 0941  . polyethylene glycol (MIRALAX / GLYCOLAX) packet 17 g  17 g Oral Daily PRN Newt Minion, MD      . pregabalin (LYRICA) capsule 200 mg  200 mg Oral BID Karmen Bongo, MD   200 mg at 08/18/16 8916     Discharge Medications: Please see discharge summary for a list of discharge medications.  Relevant Imaging Results:  Relevant Lab Results:   Additional Information [phone]  Wende Neighbors, LCSW

## 2016-08-18 NOTE — Progress Notes (Signed)
Physical Therapy Treatment Patient Details Name: Todd [name] MRN: [id number] DOB: 10-02-1955 Today's Date: 08/18/2016    History of Present Illness 61 yo admitted with RLE cellulitis and LLE gangrene s/p Lt BKA 3/2 and now Rt BKA 3/7. PMHx: DM, CKD, HTN, gout    PT Comments    Pt pleasant with 5/10 pain RLE, maintaining Right knee in flexion on arrival , incontinent with condom cath off and linens soaked on arrival. Pt educated for positioning, knee extension, HEP, transfers and progression. Pt transferred better with A/P transfer and new amputation than prior squat pivot. Will continue to follow to maximize mobility and function.     Follow Up Recommendations  SNF;Supervision/Assistance - 24 hour     Equipment Recommendations  Wheelchair cushion (measurements PT);Wheelchair (measurements PT);3in1 (PT);Other (comment) (drop arm 3in1, sliding board)    Recommendations for Other Services       Precautions / Restrictions Precautions Precautions: Fall Precaution Comments: incontinent Restrictions Weight Bearing Restrictions: Yes RLE Weight Bearing: Non weight bearing LLE Weight Bearing: Non weight bearing    Mobility  Bed Mobility Overal bed mobility: Needs Assistance Bed Mobility: Supine to Sit;Rolling Rolling: Supervision   Supine to sit: Min assist     General bed mobility comments: cues for sequence to roll and use of rail x 2 bil, min assist with cues for supine to long sitting  Transfers Overall transfer level: Needs assistance   Transfers: Comptroller transfers: Mod assist;+2 safety/equipment   General transfer comment: mod assist with cues and pad to pivot 90degrees in bed and scoot posteriorly into chair with assist for setup and transition with +2 for safety and lines  Ambulation/Gait             General Gait Details: unable   Stairs            Wheelchair Mobility    Modified Rankin (Stroke  Patients Only)       Balance Overall balance assessment: Needs assistance   Sitting balance-Leahy Scale: Poor                              Cognition Arousal/Alertness: Awake/alert Behavior During Therapy: Flat affect Overall Cognitive Status: Impaired/Different from baseline Area of Impairment: Memory     Memory: Decreased short-term memory              Exercises Amputee Exercises Hip Extension: Sidelying;AROM;15 reps;Both Hip ABduction/ADduction: AROM;10 reps;15 reps;Seated;Both Knee Extension: AROM;Both;Seated;15 reps    General Comments        Pertinent Vitals/Pain Pain Assessment: 0-10 Pain Score: 5  Pain Location: RLE Pain Descriptors / Indicators: Aching;Sore Pain Intervention(s): Limited activity within patient's tolerance;Monitored during session;Patient requesting pain meds-RN notified;Repositioned    Home Living                      Prior Function            PT Goals (current goals can now be found in the care plan section) Acute Rehab PT Goals Time For Goal Achievement: 09/01/16 Potential to Achieve Goals: Fair Progress towards PT goals: Goals downgraded-see care plan (downgraded to reflect new medical/mobility status)    Frequency           PT Plan Current plan remains appropriate    Co-evaluation             End of Session   Activity  Tolerance: Patient tolerated treatment well Patient left: in chair;with call bell/phone within reach;with chair alarm set Nurse Communication: Mobility status;Other (comment) (transfer sequence)       Time: 6812-7517 PT Time Calculation (min) (ACUTE ONLY): 30 min  Charges:  $Therapeutic Exercise: 8-22 mins $Therapeutic Activity: 8-22 mins                    G Codes:       Bao Coreas B Estill Llerena 08-31-16, 10:38 AM  Elwyn Reach, [place]

## 2016-08-18 NOTE — Clinical Social Work Note (Signed)
Clinical Social Work Assessment  Patient Details  Name: Todd [name] MRN: [id number] Date of Birth: 15-Mar-1956  Date of referral:  08/18/16               Reason for consult:  Discharge Planning                Permission sought to share information with:  Family Supports Permission granted to share information::  Yes, Verbal Permission Granted  Name::     Lyal Husted  Agency::     Relationship::  mother  Contact Information:  [phone]  Housing/Transportation Living arrangements for the past 2 months:  Single Family Home Source of Information:  Parent Patient Interpreter Needed:  None Criminal Activity/Legal Involvement Pertinent to Current Situation/Hospitalization:  No - Comment as needed Significant Relationships:  Siblings, Parents Lives with:  Parents Do you feel safe going back to the place where you live?  Yes Need for family participation in patient care:  Yes (Comment)  Care giving concerns:  Patient lives at home with mother and has support from his siblings    Facilities manager / plan:  CSW was unable to do assessment with patient. CSW spoke to patient's mother and sister. Family is agreeable for patient to discharge to SNF but would like a facility that would force patient participate in rehab. CSW informed family that a SNF will try to encourage patient to participate as best they can. CSW to refer patient to SNF in the area. CSW will follow up with family once bed offers has been made. Employment status:  Retired Forensic scientist:  Other (Comment Required) Nurse, mental health) PT Recommendations:  [place] / Referral to community resources:  [place]  Patient/Family's Response to care:  Patient verbalized appreciation and understanding for CSW role and involvement in care. Family agreeable with current discharge plan to SNF  Patient/Family's Understanding of and Emotional Response to Diagnosis, Current Treatment, and  Prognosis:  Family with good understanding of current medical state and limitations around most recent hospitalization.  Emotional Assessment Appearance:  Appears stated age Attitude/Demeanor/Rapport:  Unable to Assess Affect (typically observed):  Unable to Assess Orientation:  Oriented to Self, Oriented to Place, Oriented to  Time, Oriented to Situation Alcohol / Substance use:  Not Applicable Psych involvement (Current and /or in the community):  No (Comment)  Discharge Needs  Concerns to be addressed:  No discharge needs identified Readmission within the last 30 days:  No Current discharge risk:  None Barriers to Discharge:  No Barriers Identified   Wende Neighbors, LCSW 08/18/2016, 5:39 PM

## 2016-08-18 NOTE — Clinical Social Work Placement (Signed)
   CLINICAL SOCIAL WORK PLACEMENT  NOTE  Date:  08/18/2016  Patient Details  Name: Todd [name] MRN: [id number] Date of Birth: 05/17/1956  Clinical Social Work is seeking post-discharge placement for this patient at the [place] level of care (*CSW will initial, date and re-position this form in  chart as items are completed):  Yes   Patient/family provided with [place] Work Department's list of facilities offering this level of care within the geographic area requested by the patient (or if unable, by the patient's family).  Yes   Patient/family informed of their freedom to choose among providers that offer the needed level of care, that participate in Medicare, Medicaid or managed care program needed by the patient, have an available bed and are willing to accept the patient.  Yes   Patient/family informed of [facility]'s ownership interest in [facility] and [facility], as well as of the fact that they are under no obligation to receive care at these facilities.  PASRR submitted to EDS on       PASRR number received on       Existing PASRR number confirmed on       FL2 transmitted to all facilities in geographic area requested by pt/family on       FL2 transmitted to all facilities within larger geographic area on       Patient informed that his/her managed care company has contracts with or will negotiate with certain facilities, including the following:            Patient/family informed of bed offers received.  Patient chooses bed at       Physician recommends and patient chooses bed at      Patient to be transferred to   on  .  Patient to be transferred to facility by       Patient family notified on   of transfer.  Name of family member notified:        PHYSICIAN Please sign FL2     Additional Comment:    _______________________________________________ Wende Neighbors, LCSW 08/18/2016, 5:58 PM

## 2016-08-19 ENCOUNTER — Telehealth: Payer: Self-pay | Admitting: Vascular Surgery

## 2016-08-19 LAB — GLUCOSE, CAPILLARY
GLUCOSE-CAPILLARY: 92 mg/dL (ref 65–99)
Glucose-Capillary: 119 mg/dL — ABNORMAL HIGH (ref 65–99)

## 2016-08-19 MED ORDER — INSULIN GLARGINE 100 UNIT/ML SOLOSTAR PEN: 10.0000 [IU] | PEN_INJECTOR | Freq: Every day | SUBCUTANEOUS | 11 refills | Status: DC

## 2016-08-19 MED ORDER — ACETAMINOPHEN 325 MG PO TABS: 650.0000 mg | ORAL_TABLET | Freq: Four times a day (QID) | ORAL | Status: DC | PRN

## 2016-08-19 MED ORDER — OXYCODONE-ACETAMINOPHEN 5-325 MG PO TABS: 1.0000 | ORAL_TABLET | ORAL | 0 refills | Status: DC | PRN

## 2016-08-19 MED ORDER — POLYETHYLENE GLYCOL 3350 17 G PO PACK: 17.0000 g | PACK | Freq: Every day | ORAL | 0 refills | Status: DC

## 2016-08-19 MED ORDER — ASPIRIN 325 MG PO TBEC: 325.0000 mg | DELAYED_RELEASE_TABLET | Freq: Every day | ORAL | 0 refills | Status: DC

## 2016-08-19 MED ORDER — GLUCERNA SHAKE PO LIQD: 237.0000 mL | Freq: Two times a day (BID) | ORAL | 0 refills | Status: DC

## 2016-08-19 NOTE — Progress Notes (Addendum)
Clinical Social Worker facilitated patient discharge including contacting patient family and facility to confirm patient discharge plans.  Clinical information faxed to facility and family agreeable with plan.  CSW arranged ambulance transport via [facility] to [facility] .  RN Nego to call [phone] (pt will be in room 205) for report prior to discharge.  Clinical Social Worker will sign off for now as social work intervention is no longer needed. Please consult [place] again if new need arises.  Rhea Pink, MSW, [place]

## 2016-08-19 NOTE — Discharge Summary (Signed)
PATIENT DETAILS Name: Todd [name] Age: 61 y.o. Sex: male Date of Birth: [birth year] MRN: [id number]. Admitting Physician: Karmen Bongo, MD ESP:[id number],MAUQJF, MD  Admit Date: 08/08/2016 Discharge date: 08/19/2016  Recommendations for Outpatient Follow-up:  1. Follow up with PCP in 1-2 weeks 2. Please obtain BMP/CBC in one week 3. Please ensure follow-up with Dr. Sharol Given (orthopedics) and Dr. Oneida Alar (vascular surgery)  Admitted From:  Home  Disposition: SNF   Home Health: No  Equipment/Devices: None  Discharge Condition: Stable  CODE STATUS: FULL CODE  Diet recommendation:  Heart Healthy / Carb Modified  Brief Summary: See H&P, Labs, Consult and Test reports for all details in brief, Patient is a 61 y.o. male with history of hypothyroidism, hypertension, dyslipidemia, diabetes with more left than right leg gangrenous changes. Evaluated by both vascular and orthopedic surgery, underwent left BKA on 3/2, and subsequently a right BKA on 3/7. See below for further details  Brief Hospital Course: Bilateral lower extremity critical leg ischemia with gangrenous changes: Followed closely by both orthopedics and vascular surgery during this hospital stay.Underwent vascular surgery evaluation with placement of a stent in the left superficial femoral artery. Subsequently underwent left BKA on 3/2. Vascular surgery then did a right lower extremity angiogram on 3/5, felt to have severe peripheral vascular disease without any revascularization options. Vascular surgery subsequently performed a right BKA on 3/7. Currently doing well, spoke with Dr. Sharol Given over the phone on 3/9-recommendations were to remove left BKA stump Vac-and apply dry dressings. See below regarding wound care recommendations by vascular surgery.   Leukocytosis: Downtrending, probably reactive secondary to gangrene. No longer on antimicrobial therapy.  AKI: Likely mild prerenal azotemia, resolved with supportive  measures.  Type 2 diabetes: CBGs currently stable, continue Lantus 10 units daily and SSI. Follow CBGs  Hypothyroidism: Continue levothyroxine  Hypertension: Stable, continue Coreg  History of gout: continue colchicine  Procedures/Studies: 3/1>> 1. US guided cannulation of right common femoral artery 2. Aortogram with bilateral lower extremity runoff 3. Drug coated balloon angioplasty of left sfa and popliteal arteries with 36mm impact admiral 4. Moderate sedation for 75 minutes with versed and fentanyl  3/2>> Transtibial amputation Application of Prevena wound VAC Application of stump shrinker  3/5>> Right lower extremity arteriogram  Discharge Diagnoses:  Principal Problem:   Cellulitis feet due to stasis dermatitis & edema Active Problems:   AKI (acute kidney injury) ([place])   HTN (hypertension)   Hypothyroidism   Diabetes type 2, uncontrolled ([place])   Diabetic peripheral neuropathy (HCC)   Gangrene associated with diabetes mellitus ([place])   Pressure injury of skin   Discharge Instructions:  Activity:  As tolerated with Full fall precautions use walker/cane & assistance as needed   Discharge Instructions    Call MD for:  redness, tenderness, or signs of infection (pain, swelling, redness, odor or green/yellow discharge around incision site)    Complete by:  As directed    Diet - low sodium heart healthy    Complete by:  As directed    Diet Carb Modified    Complete by:  As directed    Discharge wound care:    Complete by:  As directed    Left BKA-dry dressing  Right BKA-Wash right BKA with soap and water, pat dry. Wrap with Kerlix and ACE daily   Increase activity slowly    Complete by:  As directed      Allergies as of 08/19/2016      Reactions   No Known Allergies  Medication List    STOP taking these medications   amoxicillin-clavulanate 875-125 MG tablet Commonly known as:  AUGMENTIN     TAKE these medications   acetaminophen  325 MG tablet Commonly known as:  TYLENOL Take 2 tablets (650 mg total) by mouth every 6 (six) hours as needed for mild pain (or Fever >/= 101). What changed:  reasons to take this   aspirin 325 MG EC tablet Take 1 tablet (325 mg total) by mouth daily. Start taking on:  08/20/2016   carvedilol 3.125 MG tablet Commonly known as:  COREG Take 1 tablet (3.125 mg total) by mouth 2 (two) times daily with a meal.   COLCRYS 0.6 MG tablet Generic drug:  colchicine Take 0.6 mg by mouth 2 (two) times daily.   feeding supplement (GLUCERNA SHAKE) Liqd Take 237 mLs by mouth 2 (two) times daily between meals.   Insulin Glargine 100 UNIT/ML Solostar Pen Commonly known as:  LANTUS SOLOSTAR Inject 10 Units into the skin daily. What changed:  how much to take   levothyroxine 100 MCG tablet Commonly known as:  SYNTHROID, LEVOTHROID Take 1 tablet (100 mcg total) by mouth daily before breakfast.   lisinopril 10 MG tablet Commonly known as:  PRINIVIL,ZESTRIL Take 1 tablet (10 mg total) by mouth daily.   metFORMIN 500 MG tablet Commonly known as:  GLUCOPHAGE Take 1 tablet (500 mg total) by mouth daily with breakfast.   oxyCODONE-acetaminophen 5-325 MG tablet Commonly known as:  PERCOCET/ROXICET Take 1-2 tablets by mouth every 4 (four) hours as needed for moderate pain.   polyethylene glycol packet Commonly known as:  MIRALAX / GLYCOLAX Take 17 g by mouth daily.   pregabalin 200 MG capsule Commonly known as:  LYRICA Take 2 capsules (400 mg total) by mouth daily. What changed:  how much to take  when to take this   triamterene-hydrochlorothiazide 37.5-25 MG tablet Commonly known as:  MAXZIDE-25 Take 1 tablet by mouth daily. For blood pressure and fluid      Follow-up Information    Newt Minion, MD In 2 weeks.   Specialty:  Orthopedic Surgery Contact information: [place] [address] [phone]        Ruta Hinds, MD In 4 weeks.     Specialties:  Vascular Surgery, Cardiology Why:  Our office will call you to arrange an appointment  Contact information: [address] [place] [address] [phone]          Allergies  Allergen Reactions  . No Known Allergies     Consultations:   orthopedic surgery and vascular surgery   Other Procedures/Studies: US Arterial Seg Multiple  Result Date: 08/09/2016 CLINICAL DATA:  Worsening right foot cellulitis. Left lower extremity ischemia with multiple ulcers. EXAM: NONINVASIVE PHYSIOLOGIC VASCULAR STUDY OF BILATERAL LOWER EXTREMITIES TECHNIQUE: Evaluation of both lower extremities was performed at rest, including calculation of ankle-brachial indices, multiple segmental pressure evaluation, segmental Doppler and segmental pulse volume recording. COMPARISON:  None. FINDINGS: Right ABI:  0.29 Left ABI: 0.00. Doppler signal not detected in the left dorsalis pedis artery or left posterior tibial artery. Right Lower Extremity: Monophasic waveforms throughout the right lower extremity. Low amplitude PVR waveforms in the right lower extremity. Unable to detect signal in the right DP. Left Lower Extremity: There may be some biphasic and triphasic Doppler waveforms in the left femoral region. Monophasic waveforms in the left SFA. No significant signal in the left popliteal artery. No significant signal identified in the left posterior  tibial artery or left dorsalis pedis artery. Abnormal PVR waveforms. Segmental pressures not obtained in the left upper leg. IMPRESSION: Left ABI could not be obtained. Left ankle arteries appear to be occluded. Severe occlusive arterial disease in the right lower extremity with ABI of 0.29. Electronically Signed   By: Markus Daft M.D.   On: 08/09/2016 16:59   Dg Chest Portable 1 View  Result Date: 08/08/2016 CLINICAL DATA:  Left foot swelling starting last night, open blisters today EXAM: PORTABLE CHEST 1 VIEW COMPARISON:  07/03/2016 FINDINGS:  Cardiomediastinal silhouette is stable. No infiltrate or pleural effusion. No pulmonary edema. Bony thorax is unremarkable. IMPRESSION: No active disease. Electronically Signed   By: Lahoma Crocker M.D.   On: 08/08/2016 17:06   Dg Foot 2 Views Left  Result Date: 08/08/2016 CLINICAL DATA:  Left foot swelling last night, open blisters today. History of diabetes, gout, hypertension. EXAM: LEFT FOOT - 2 VIEW COMPARISON:  Plain film of the left foot dated 07/03/2016. FINDINGS: Osseous alignment is stable. No acute or suspicious osseous finding. Again noted is a stable benign appearing chondroid lesion versus old bone infarct within the lower right tibia. Soft tissues about the right foot and ankle are unremarkable. IMPRESSION: No acute findings. Electronically Signed   By: Franki Cabot M.D.   On: 08/08/2016 17:10     TODAY-DAY OF DISCHARGE:  Subjective:   Todd [name] today has no headache,no chest abdominal pain,no new weakness tingling or numbness, feels much better wants to go home today.   Objective:   Blood pressure (!) 148/61, pulse 71, temperature 98.4 F (36.9 C), temperature source Oral, resp. rate 18, height 5\' 8"  (1.727 m), weight 69.7 kg (153 lb 9.6 oz), SpO2 100 %.  Intake/Output Summary (Last 24 hours) at 08/19/16 1033 Last data filed at 08/19/16 0853  Gross per 24 hour  Intake                0 ml  Output              900 ml  Net             -900 ml   Filed Weights   08/08/16 1600 08/08/16 2052 08/10/16 0600  Weight: 77.1 kg (170 lb) 70.9 kg (156 lb 4.8 oz) 69.7 kg (153 lb 9.6 oz)    Exam: Awake Alert, Oriented *3, No new F.N deficits, Normal affect [place].AT,PERRAL Supple Neck,No JVD, No cervical lymphadenopathy appriciated.  Symmetrical Chest wall movement, Good air movement bilaterally, CTAB RRR,No Gallops,Rubs or new Murmurs, No Parasternal Heave +ve B.Sounds, Abd Soft, Non tender, No organomegaly appriciated, No rebound -guarding or rigidity. No Cyanosis, Clubbing or  edema, No new Rash or bruise   PERTINENT RADIOLOGIC STUDIES: US Arterial Seg Multiple  Result Date: 08/09/2016 CLINICAL DATA:  Worsening right foot cellulitis. Left lower extremity ischemia with multiple ulcers. EXAM: NONINVASIVE PHYSIOLOGIC VASCULAR STUDY OF BILATERAL LOWER EXTREMITIES TECHNIQUE: Evaluation of both lower extremities was performed at rest, including calculation of ankle-brachial indices, multiple segmental pressure evaluation, segmental Doppler and segmental pulse volume recording. COMPARISON:  None. FINDINGS: Right ABI:  0.29 Left ABI: 0.00. Doppler signal not detected in the left dorsalis pedis artery or left posterior tibial artery. Right Lower Extremity: Monophasic waveforms throughout the right lower extremity. Low amplitude PVR waveforms in the right lower extremity. Unable to detect signal in the right DP. Left Lower Extremity: There may be some biphasic and triphasic Doppler waveforms in the left femoral region. Monophasic waveforms in the  left SFA. No significant signal in the left popliteal artery. No significant signal identified in the left posterior tibial artery or left dorsalis pedis artery. Abnormal PVR waveforms. Segmental pressures not obtained in the left upper leg. IMPRESSION: Left ABI could not be obtained. Left ankle arteries appear to be occluded. Severe occlusive arterial disease in the right lower extremity with ABI of 0.29. Electronically Signed   By: Markus Daft M.D.   On: 08/09/2016 16:59   Dg Chest Portable 1 View  Result Date: 08/08/2016 CLINICAL DATA:  Left foot swelling starting last night, open blisters today EXAM: PORTABLE CHEST 1 VIEW COMPARISON:  07/03/2016 FINDINGS: Cardiomediastinal silhouette is stable. No infiltrate or pleural effusion. No pulmonary edema. Bony thorax is unremarkable. IMPRESSION: No active disease. Electronically Signed   By: Lahoma Crocker M.D.   On: 08/08/2016 17:06   Dg Foot 2 Views Left  Result Date: 08/08/2016 CLINICAL DATA:   Left foot swelling last night, open blisters today. History of diabetes, gout, hypertension. EXAM: LEFT FOOT - 2 VIEW COMPARISON:  Plain film of the left foot dated 07/03/2016. FINDINGS: Osseous alignment is stable. No acute or suspicious osseous finding. Again noted is a stable benign appearing chondroid lesion versus old bone infarct within the lower right tibia. Soft tissues about the right foot and ankle are unremarkable. IMPRESSION: No acute findings. Electronically Signed   By: Franki Cabot M.D.   On: 08/08/2016 17:10     PERTINENT LAB RESULTS: CBC:  Recent Labs  08/17/16 0635 08/18/16 0536  WBC 18.3* 16.9*  HGB 10.1* 9.7*  HCT 30.9* 29.5*  PLT 604* 600*   CMET CMP     Component Value Date/Time   NA 133 (L) 08/18/2016 0536   NA 136 08/03/2016 1119   K 3.7 08/18/2016 0536   CL 103 08/18/2016 0536   CO2 20 (L) 08/18/2016 0536   GLUCOSE 97 08/18/2016 0536   BUN 9 08/18/2016 0536   BUN 30 (H) 08/03/2016 1119   CREATININE 0.86 08/18/2016 0536   CALCIUM 7.3 (L) 08/18/2016 0536   PROT 8.6 (H) 08/08/2016 1617   PROT 8.0 08/03/2016 1119   ALBUMIN 2.4 (L) 08/08/2016 1617   ALBUMIN 3.1 (L) 08/03/2016 1119   AST 50 (H) 08/08/2016 1617   ALT 40 08/08/2016 1617   ALKPHOS 87 08/08/2016 1617   BILITOT 0.6 08/08/2016 1617   BILITOT 0.8 08/03/2016 1119   GFRNONAA >60 08/18/2016 0536   GFRAA >60 08/18/2016 0536    GFR Estimated Creatinine Clearance: 87.3 mL/min (by C-G formula based on SCr of 0.86 mg/dL). No results for input(s): LIPASE, AMYLASE in the last 72 hours. No results for input(s): CKTOTAL, CKMB, CKMBINDEX, TROPONINI in the last 72 hours. Invalid input(s): POCBNP No results for input(s): DDIMER in the last 72 hours. No results for input(s): HGBA1C in the last 72 hours. No results for input(s): CHOL, HDL, LDLCALC, TRIG, CHOLHDL, LDLDIRECT in the last 72 hours. No results for input(s): TSH, T4TOTAL, T3FREE, THYROIDAB in the last 72 hours.  Invalid input(s): FREET3 No  results for input(s): VITAMINB12, FOLATE, FERRITIN, TIBC, IRON, RETICCTPCT in the last 72 hours. Coags: No results for input(s): INR in the last 72 hours.  Invalid input(s): PT Microbiology: Recent Results (from the past 240 hour(s))  Surgical pcr screen     Status: None   Collection Time: 08/16/16 11:52 PM  Result Value Ref Range Status   MRSA, PCR NEGATIVE NEGATIVE Final   Staphylococcus aureus NEGATIVE NEGATIVE Final    Comment:  The Xpert SA Assay (FDA approved for NASAL specimens in patients over 54 years of age), is one component of a comprehensive surveillance program.  Test performance has been validated by [facility] for patients greater than or equal to 32 year old. It is not intended to diagnose infection nor to guide or monitor treatment.     FURTHER DISCHARGE INSTRUCTIONS:  Get Medicines reviewed and adjusted: Please take all your medications with you for your next visit with your Primary MD  Laboratory/radiological data: Please request your Primary MD to go over all hospital tests and procedure/radiological results at the follow up, please ask your Primary MD to get all Hospital records sent to his/her office.  In some cases, they will be blood work, cultures and biopsy results pending at the time of your discharge. Please request that your primary care M.D. goes through all the records of your hospital data and follows up on these results.  Also Note the following: If you experience worsening of your admission symptoms, develop shortness of breath, life threatening emergency, suicidal or homicidal thoughts you must seek medical attention immediately by calling 911 or calling your MD immediately  if symptoms less severe.  You must read complete instructions/literature along with all the possible adverse reactions/side effects for all the Medicines you take and that have been prescribed to you. Take any new Medicines after you have completely understood and  accpet all the possible adverse reactions/side effects.   Do not drive when taking Pain medications or sleeping medications (Benzodaizepines)  Do not take more than prescribed Pain, Sleep and Anxiety Medications. It is not advisable to combine anxiety,sleep and pain medications without talking with your primary care practitioner  Special Instructions: If you have smoked or chewed Tobacco  in the last 2 yrs please stop smoking, stop any regular Alcohol  and or any Recreational drug use.  Wear Seat belts while driving.  Please note: You were cared for by a hospitalist during your hospital stay. Once you are discharged, your primary care physician will handle any further medical issues. Please note that NO REFILLS for any discharge medications will be authorized once you are discharged, as it is imperative that you return to your primary care physician (or establish a relationship with a primary care physician if you do not have one) for your post hospital discharge needs so that they can reassess your need for medications and monitor your lab values.  Total Time spent coordinating discharge including counseling, education and face to face time equals 45 minutes.  SignedOren Binet 08/19/2016 10:33 AM

## 2016-08-19 NOTE — Telephone Encounter (Signed)
spoke to sister, gave her appt info and address for 4/5, mailing lttr as well

## 2016-08-19 NOTE — Progress Notes (Signed)
Nurse to nurse report given at [facility].  Family notified of transfer.  Pt assisted to dress, all remaining belongings bagged and sent with Pt.

## 2016-08-19 NOTE — Telephone Encounter (Signed)
-----   Message from Mena Goes, RN sent at 08/19/2016  9:56 AM EST ----- Regarding: FW: 4 weeks   ----- Message ----- From: Mena Goes, RN Sent: 08/19/2016   9:45 AM To: Alvia Grove, PA-C, [facility] Charge Pool Subject: 4 weeks                                          ----- Message ----- From: Alvia [place], PA-C Sent: 08/19/2016   8:46 AM To: [facility] Charge Pool  S/p right BKA 08/17/16  F/u in 4 weeks with Dr. Oneida Alar.   Please cancel appointment on 4/18 with [facility].  Thanks Maudie Mercury

## 2016-08-19 NOTE — Progress Notes (Addendum)
[facility] Progress Note  Subjective  - POD #2  Doing ok this morning.   Objective Vitals:   08/18/16 1944 08/19/16 0549  BP: (!) 148/76 (!) 148/61  Pulse: 79 71  Resp: 18 18  Temp: 98.2 F (36.8 C) 98.4 F (36.9 C)    Intake/Output Summary (Last 24 hours) at 08/19/16 0844 Last data filed at 08/18/16 2244  Gross per 24 hour  Intake                0 ml  Output             1100 ml  Net            -1100 ml    Right BKA with minimal edema.  Staples intact. Skin edges viable.   Assessment/Planning: 61 y.o. male is s/p: right BKA 2 Days Post-Op   Right BKA healing well Ok for SNF from vascular standpoint. F/u in 4 weeks for staple removal.   Alvia Grove 08/19/2016 8:44 AM -- Agree with above To SNF today  Ruta Hinds, MD [facility] [place] Office: [phone] Pager: [phone]  Laboratory CBC    Component Value Date/Time   WBC 16.9 (H) 08/18/2016 0536   HGB 9.7 (L) 08/18/2016 0536   HCT 29.5 (L) 08/18/2016 0536   HCT 36.1 (L) 08/05/2016 1630   PLT 600 (H) 08/18/2016 0536   PLT 793 (H) 08/05/2016 1630    BMET    Component Value Date/Time   NA 133 (L) 08/18/2016 0536   NA 136 08/03/2016 1119   K 3.7 08/18/2016 0536   CL 103 08/18/2016 0536   CO2 20 (L) 08/18/2016 0536   GLUCOSE 97 08/18/2016 0536   BUN 9 08/18/2016 0536   BUN 30 (H) 08/03/2016 1119   CREATININE 0.86 08/18/2016 0536   CALCIUM 7.3 (L) 08/18/2016 0536   GFRNONAA >60 08/18/2016 0536   GFRAA >60 08/18/2016 0536    COAG Lab Results  Component Value Date   INR 1.11 08/08/2016   No results found for: PTT  Antibiotics Anti-infectives    Start     Dose/Rate Route Frequency Ordered Stop   08/14/16 1300  vancomycin (VANCOCIN) IVPB 750 mg/150 ml premix  Status:  Discontinued     750 mg 150 mL/hr over 60 Minutes Intravenous Every 12 hours 08/14/16 1154 08/16/16 1422   08/12/16 0845  cefUROXime (ZINACEF) 1.5 g in dextrose 5 % 50 mL  IVPB     1.5 g 100 mL/hr over 30 Minutes Intravenous On call to O.R. 08/12/16 0830 08/13/16 0559   08/09/16 1600  vancomycin (VANCOCIN) IVPB 1000 mg/200 mL premix  Status:  Discontinued     1,000 mg 200 mL/hr over 60 Minutes Intravenous Every 24 hours 08/08/16 2153 08/14/16 1154   08/08/16 2200  cefTRIAXone (ROCEPHIN) 2 g in dextrose 5 % 50 mL IVPB  Status:  Discontinued     2 g 100 mL/hr over 30 Minutes Intravenous Every 24 hours 08/08/16 2056 08/16/16 1418   08/08/16 2200  metroNIDAZOLE (FLAGYL) tablet 500 mg  Status:  Discontinued     500 mg Oral Every 8 hours 08/08/16 2056 08/09/16 1554   08/08/16 1615  vancomycin (VANCOCIN) IVPB 1000 mg/200 mL premix     1,000 mg 200 mL/hr over 60 Minutes Intravenous  Once 08/08/16 1610 08/08/16 1801   08/08/16 1615  piperacillin-tazobactam (ZOSYN) IVPB 3.375 g     3.375 g 100 mL/hr over 30 Minutes Intravenous  Once 08/08/16 1610 08/08/16 [place], PA-C [facility] Office: [phone] Pager: [phone] 08/19/2016 8:44 AM

## 2016-08-22 ENCOUNTER — Telehealth (INDEPENDENT_AMBULATORY_CARE_PROVIDER_SITE_OTHER): Payer: Self-pay | Admitting: *Deleted

## 2016-08-22 ENCOUNTER — Telehealth: Payer: Self-pay | Admitting: Vascular Surgery

## 2016-08-22 ENCOUNTER — Telehealth: Payer: Self-pay

## 2016-08-22 LAB — GLUCOSE, CAPILLARY: Glucose-Capillary: 103 mg/dL — ABNORMAL HIGH (ref 65–99)

## 2016-08-22 NOTE — Telephone Encounter (Signed)
Called to reschedule, [name] stated that his transportation will call to reschedule this for PT. She will follow up tomorrow to see if this has been done 3/13

## 2016-08-22 NOTE — Telephone Encounter (Signed)
Pt god sister calling to set up pt HFU. Pt had surgery on 3/9. I scheduled pt for 4/5 but she is wanting to know if he needs to be sooner. Discharge just says to follow up but no time frame

## 2016-08-22 NOTE — Telephone Encounter (Signed)
Rescheduled the patient to 3/29 and confirmed with the [facility] center. When I called [name] back, she stated that she would like Dr. Jess Barters appointment to be on the same day. As a courtesy, I stated that I will call Dr. Jess Barters office and try to coordinate the appointments. Dr. Jess Barters office is closed now due to the snow. I left a message for [name] stating this and that I will forward this to scheduling since I will be out tomorrow.

## 2016-08-22 NOTE — Telephone Encounter (Signed)
rec'd phone call from pt's sisters, Todd [name] and Todd [name].  Inquired about the "5 week" post-op f/u appt. with Dr. Oneida Alar. (pt. Is scheduled 09/15/16)  Reported that "Dr. Sharol Given performed surgery on pt's left leg, and wants to see the pt. back in 2 weeks."  Then, stated that "Dr. Oneida Alar operated on his right leg, and wants to see him back in 5 weeks."  Questioned "why the discrepancy in the follow-up time frame to see the pt. after similar surgeries?"  Advised that it is our usual practice to have the pt's with AKA or BKA to f/u in 4 weeks, for staple removal.  Sister questioned "how do you know that it is healing, and not developing a problem, before the follow-up appt.?"   Advised the sister, we would rely on the nursing facility, in this case, to call and report if there is a problem, and we would work the pt. in, as needed, sooner than the 4 wk. f/u appt.  The sister reported that it is difficult to get the pt. to all these different appts., and questioned why there isn't better coordination of care?  Advised the sister that our office can make the pt. an appt. with Dr. Oneida Alar, if she preferred, at the 2 week time frame, but that the staples may not be ready to come out, and the pt. may have to return, at later time frame, for staple removal.  Sister agreed with plan.  27 minutes was devoted to discussing the appt. follow up, and family questions re: discrepancy in the 2 surgeons practices. Per the sister, Todd [name]; she would like the appt. With Dr. Oneida Alar coordinated through the rehab facility, he is residing at; the [facility]. In [place].  Advised the nursing facility will be contacted re: appt.

## 2016-08-22 NOTE — Telephone Encounter (Signed)
The patient's appointment has been moved from 4/5 to 3/29 (see note from Roby). I attempted to contact Dr. Jess Barters office to see if they can see the patient on 3/29 (to coordinate transportation) but they are closed today and will be open tomorrow at 10am.

## 2016-08-22 NOTE — Telephone Encounter (Signed)
Pt should be 2 weeks post op so some time next week please call. thanks

## 2016-08-23 LAB — GLUCOSE, CAPILLARY: Glucose-Capillary: 83 mg/dL (ref 65–99)

## 2016-08-26 ENCOUNTER — Ambulatory Visit: Payer: BLUE CROSS/BLUE SHIELD | Admitting: Family Medicine

## 2016-08-30 ENCOUNTER — Inpatient Hospital Stay (HOSPITAL_COMMUNITY)
Admission: EM | Admit: 2016-08-30 | Discharge: 2016-09-04 | DRG: 682 | Disposition: A | Discharge status: Skilled Nursing Facility | Payer: BLUE CROSS/BLUE SHIELD | Source: Other Acute Inpatient Hospital | Attending: Family Medicine | Admitting: Family Medicine

## 2016-08-30 DIAGNOSIS — I959 Hypotension, unspecified: Secondary | ICD-10-CM | POA: Diagnosis present

## 2016-08-30 DIAGNOSIS — E1142 Type 2 diabetes mellitus with diabetic polyneuropathy: Secondary | ICD-10-CM | POA: Diagnosis present

## 2016-08-30 DIAGNOSIS — D649 Anemia, unspecified: Secondary | ICD-10-CM | POA: Diagnosis present

## 2016-08-30 DIAGNOSIS — E1152 Type 2 diabetes mellitus with diabetic peripheral angiopathy with gangrene: Secondary | ICD-10-CM | POA: Diagnosis present

## 2016-08-30 DIAGNOSIS — Z6820 Body mass index (BMI) 20.0-20.9, adult: Secondary | ICD-10-CM

## 2016-08-30 DIAGNOSIS — N189 Chronic kidney disease, unspecified: Secondary | ICD-10-CM

## 2016-08-30 DIAGNOSIS — J9601 Acute respiratory failure with hypoxia: Secondary | ICD-10-CM | POA: Diagnosis not present

## 2016-08-30 DIAGNOSIS — E785 Hyperlipidemia, unspecified: Secondary | ICD-10-CM | POA: Diagnosis present

## 2016-08-30 DIAGNOSIS — F17210 Nicotine dependence, cigarettes, uncomplicated: Secondary | ICD-10-CM | POA: Diagnosis present

## 2016-08-30 DIAGNOSIS — R7989 Other specified abnormal findings of blood chemistry: Secondary | ICD-10-CM | POA: Diagnosis present

## 2016-08-30 DIAGNOSIS — E1165 Type 2 diabetes mellitus with hyperglycemia: Secondary | ICD-10-CM | POA: Diagnosis present

## 2016-08-30 DIAGNOSIS — Z89511 Acquired absence of right leg below knee: Secondary | ICD-10-CM

## 2016-08-30 DIAGNOSIS — Z452 Encounter for adjustment and management of vascular access device: Secondary | ICD-10-CM

## 2016-08-30 DIAGNOSIS — M109 Gout, unspecified: Secondary | ICD-10-CM | POA: Diagnosis present

## 2016-08-30 DIAGNOSIS — Z794 Long term (current) use of insulin: Secondary | ICD-10-CM

## 2016-08-30 DIAGNOSIS — G9349 Other encephalopathy: Secondary | ICD-10-CM | POA: Diagnosis present

## 2016-08-30 DIAGNOSIS — J9690 Respiratory failure, unspecified, unspecified whether with hypoxia or hypercapnia: Secondary | ICD-10-CM

## 2016-08-30 DIAGNOSIS — G9341 Metabolic encephalopathy: Secondary | ICD-10-CM | POA: Diagnosis present

## 2016-08-30 DIAGNOSIS — D72829 Elevated white blood cell count, unspecified: Secondary | ICD-10-CM | POA: Diagnosis present

## 2016-08-30 DIAGNOSIS — N179 Acute kidney failure, unspecified: Secondary | ICD-10-CM | POA: Diagnosis present

## 2016-08-30 DIAGNOSIS — R931 Abnormal findings on diagnostic imaging of heart and coronary circulation: Secondary | ICD-10-CM | POA: Diagnosis not present

## 2016-08-30 DIAGNOSIS — R748 Abnormal levels of other serum enzymes: Secondary | ICD-10-CM | POA: Diagnosis not present

## 2016-08-30 DIAGNOSIS — Z89512 Acquired absence of left leg below knee: Secondary | ICD-10-CM

## 2016-08-30 DIAGNOSIS — E875 Hyperkalemia: Secondary | ICD-10-CM | POA: Diagnosis present

## 2016-08-30 DIAGNOSIS — E039 Hypothyroidism, unspecified: Secondary | ICD-10-CM | POA: Diagnosis present

## 2016-08-30 DIAGNOSIS — D473 Essential (hemorrhagic) thrombocythemia: Secondary | ICD-10-CM | POA: Diagnosis not present

## 2016-08-30 DIAGNOSIS — N170 Acute kidney failure with tubular necrosis: Secondary | ICD-10-CM | POA: Diagnosis present

## 2016-08-30 DIAGNOSIS — I739 Peripheral vascular disease, unspecified: Secondary | ICD-10-CM | POA: Diagnosis not present

## 2016-08-30 DIAGNOSIS — Z79899 Other long term (current) drug therapy: Secondary | ICD-10-CM

## 2016-08-30 DIAGNOSIS — Z7982 Long term (current) use of aspirin: Secondary | ICD-10-CM

## 2016-08-30 DIAGNOSIS — E861 Hypovolemia: Secondary | ICD-10-CM | POA: Diagnosis present

## 2016-08-30 DIAGNOSIS — I10 Essential (primary) hypertension: Secondary | ICD-10-CM | POA: Diagnosis present

## 2016-08-30 LAB — URINALYSIS, ROUTINE W REFLEX MICROSCOPIC
BILIRUBIN URINE: NEGATIVE
Glucose, UA: NEGATIVE mg/dL
KETONES UR: NEGATIVE mg/dL
LEUKOCYTES UA: NEGATIVE
NITRITE: NEGATIVE
PROTEIN: NEGATIVE mg/dL
SQUAMOUS EPITHELIAL / LPF: NONE SEEN
Specific Gravity, Urine: 1.018 (ref 1.005–1.030)
pH: 5 (ref 5.0–8.0)

## 2016-08-30 LAB — BASIC METABOLIC PANEL
Anion gap: 17 — ABNORMAL HIGH (ref 5–15)
BUN: 110 mg/dL — AB (ref 6–20)
CALCIUM: 8.7 mg/dL — AB (ref 8.9–10.3)
CHLORIDE: 103 mmol/L (ref 101–111)
CO2: 15 mmol/L — AB (ref 22–32)
CREATININE: 5.68 mg/dL — AB (ref 0.61–1.24)
GFR calc Af Amer: 11 mL/min — ABNORMAL LOW (ref >60)
GFR calc non Af Amer: 10 mL/min — ABNORMAL LOW (ref >60)
GLUCOSE: 87 mg/dL (ref 65–99)
Potassium: 6.4 mmol/L (ref 3.5–5.1)
Sodium: 135 mmol/L (ref 135–145)

## 2016-08-30 LAB — CBC
HCT: 28.5 % — ABNORMAL LOW (ref 39.0–52.0)
Hemoglobin: 9.2 g/dL — ABNORMAL LOW (ref 13.0–17.0)
MCH: 26.7 pg (ref 26.0–34.0)
MCHC: 32.3 g/dL (ref 30.0–36.0)
MCV: 82.8 fL (ref 78.0–100.0)
PLATELETS: 1009 10*3/uL — AB (ref 150–400)
RBC: 3.44 MIL/uL — ABNORMAL LOW (ref 4.22–5.81)
RDW: 14.6 % (ref 11.5–15.5)
WBC: 20.5 10*3/uL — ABNORMAL HIGH (ref 4.0–10.5)

## 2016-08-30 LAB — PROCALCITONIN: PROCALCITONIN: 0.68 ng/mL

## 2016-08-30 LAB — GLUCOSE, CAPILLARY: Glucose-Capillary: 88 mg/dL (ref 65–99)

## 2016-08-30 LAB — PROTIME-INR
INR: 1.22
Prothrombin Time: 15.5 seconds — ABNORMAL HIGH (ref 11.4–15.2)

## 2016-08-30 LAB — TROPONIN I: TROPONIN I: 0.04 ng/mL — AB (ref <0.03)

## 2016-08-30 LAB — FIBRINOGEN: Fibrinogen: 738 mg/dL — ABNORMAL HIGH (ref 210–475)

## 2016-08-30 LAB — SODIUM, URINE, RANDOM: Sodium, Ur: 47 mmol/L

## 2016-08-30 LAB — LACTIC ACID, PLASMA: Lactic Acid, Venous: 1.3 mmol/L (ref 0.5–1.9)

## 2016-08-30 LAB — MAGNESIUM: MAGNESIUM: 2.4 mg/dL (ref 1.7–2.4)

## 2016-08-30 LAB — CREATININE, URINE, RANDOM: Creatinine, Urine: 216.58 mg/dL

## 2016-08-30 LAB — PHOSPHORUS: Phosphorus: 8.2 mg/dL — ABNORMAL HIGH (ref 2.5–4.6)

## 2016-08-30 MED ORDER — SODIUM CHLORIDE 0.9 % FOR CRRT
INTRAVENOUS_CENTRAL | Status: DC | PRN
  Filled 2016-08-30: qty 1000

## 2016-08-30 MED ORDER — SODIUM BICARBONATE 8.4 % IV SOLN
INTRAVENOUS | Status: DC
  Administered 2016-08-30 – 2016-08-31 (×3): via INTRAVENOUS
  Filled 2016-08-30 (×5): qty 150

## 2016-08-30 MED ORDER — LEVOTHYROXINE SODIUM 100 MCG IV SOLR
50.0000 ug | Freq: Every day | INTRAVENOUS | Status: DC
Start: 2016-08-31 — End: 2016-09-02
  Administered 2016-08-31 – 2016-09-02 (×3): 50 ug via INTRAVENOUS
  Filled 2016-08-30 (×3): qty 5

## 2016-08-30 MED ORDER — ASPIRIN 325 MG PO TABS
325.0000 mg | ORAL_TABLET | Freq: Every day | ORAL | Status: DC
  Administered 2016-08-31 – 2016-09-04 (×5): 325 mg via ORAL
  Filled 2016-08-30 (×5): qty 1

## 2016-08-30 MED ORDER — PRISMASOL BGK 0/2.5 32-2.5 MEQ/L IV SOLN
INTRAVENOUS | Status: DC
  Administered 2016-08-31: via INTRAVENOUS_CENTRAL
  Filled 2016-08-30 (×3): qty 5000

## 2016-08-30 MED ORDER — HEPARIN SODIUM (PORCINE) 5000 UNIT/ML IJ SOLN
5000.0000 [IU] | Freq: Three times a day (TID) | INTRAMUSCULAR | Status: DC
  Administered 2016-08-30 – 2016-09-04 (×14): 5000 [IU] via SUBCUTANEOUS
  Filled 2016-08-30 (×11): qty 1

## 2016-08-30 MED ORDER — HEPARIN BOLUS VIA INFUSION (CRRT)
1000.0000 [IU] | INTRAVENOUS | Status: DC | PRN
  Filled 2016-08-30: qty 1000

## 2016-08-30 MED ORDER — INSULIN ASPART 100 UNIT/ML ~~LOC~~ SOLN
2.0000 [IU] | SUBCUTANEOUS | Status: DC
  Administered 2016-08-31 – 2016-09-01 (×2): 2 [IU] via SUBCUTANEOUS

## 2016-08-30 MED ORDER — PRISMASOL BGK 0/2.5 32-2.5 MEQ/L IV SOLN
INTRAVENOUS | Status: DC
  Administered 2016-08-31 (×3): via INTRAVENOUS_CENTRAL
  Filled 2016-08-30 (×7): qty 5000

## 2016-08-30 MED ORDER — HEPARIN SODIUM (PORCINE) 1000 UNIT/ML DIALYSIS
1000.0000 [IU] | INTRAMUSCULAR | Status: DC | PRN
  Administered 2016-08-31: 2400 [IU] via INTRAVENOUS_CENTRAL
  Filled 2016-08-30 (×2): qty 6
  Filled 2016-08-30: qty 3

## 2016-08-30 MED ORDER — SODIUM CHLORIDE 0.9 % IV SOLN
250.0000 mL | INTRAVENOUS | Status: DC | PRN
  Administered 2016-08-30: 250 mL via INTRAVENOUS

## 2016-08-30 MED ORDER — SODIUM CHLORIDE 0.9 % IJ SOLN
250.0000 [IU]/h | INTRAMUSCULAR | Status: DC
  Administered 2016-08-31: 250 [IU]/h via INTRAVENOUS_CENTRAL
  Administered 2016-08-31: 1300 [IU]/h via INTRAVENOUS_CENTRAL
  Administered 2016-08-31: 1000 [IU]/h via INTRAVENOUS_CENTRAL
  Filled 2016-08-30 (×2): qty 2

## 2016-08-30 NOTE — Consult Note (Signed)
Reason for Consult:AKI Referring Physician: Dr. Alm Bustard Todd [name] is an 61 y.o. male.  HPI: 37yr male with DM and HTN unknown duration. Hx gout, ^ lipids, thyroid dz,, and PVD.  Hosp 2/26 - 08/18/16 with gangrenous LE and ended up with 2 BKAs.  Sent to [facility] in [place].  On D/C was on Lisinopril, Meformen. Triam/HCTZ.  Presented this pm to [facility] ED, with lethargy, hypotension (bps of 80s), confusion.  Found to have Cr 5.6, BUN 109, K 7.6, bicarb 14. Sent here for dialysis.  Baseline Cr .8-1.7.  Had AKI in 1/18 with Cr of 3.27.[name]  No notes regarding that. Not sure why was put back on Lisinopril.    Has receive ivf bolus and bp now about 110 sys. Review of systems not obtained due to patient factors.   Past Medical History:  Diagnosis Date  . Diabetes mellitus without complication ([place])   . Goiter   . Gout   . Hyperlipidemia   . Hypertension   . Thyroid disease     Past Surgical History:  Procedure Laterality Date  . ABDOMINAL AORTOGRAM N/A 08/11/2016   Procedure: Abdominal Aortogram;  Surgeon: Waynetta Sandy, MD;  Location: [place] [facility];  Service: Cardiovascular;  Laterality: N/A;  . ABDOMINAL AORTOGRAM W/LOWER EXTREMITY N/A 08/15/2016   Procedure: Abdominal Aortogram w/Lower Extremity;  Surgeon: Elam Dutch, MD;  Location: [place] [facility];  Service: Cardiovascular;  Laterality: N/A;  . AMPUTATION Left 08/12/2016   Procedure: LEFT BELOW KNEE AMPUTATION;  Surgeon: Newt Minion, MD;  Location: [place];  Service: Orthopedics;  Laterality: Left;  . AMPUTATION Right 08/17/2016   Procedure: RIGHT BELOW KNEE AMPUTATION;  Surgeon: Elam Dutch, MD;  Location: [place];  Service: Vascular;  Laterality: Right;  . LOWER EXTREMITY ANGIOGRAPHY Bilateral 08/11/2016   Procedure: Lower Extremity Angiography;  Surgeon: Waynetta Sandy, MD;  Location: [place] [facility];  Service: Cardiovascular;  Laterality: Bilateral;  . PERIPHERAL VASCULAR BALLOON ANGIOPLASTY Left  08/11/2016   Procedure: Peripheral Vascular Balloon Angioplasty;  Surgeon: Waynetta Sandy, MD;  Location: [place] [facility];  Service: Cardiovascular;  Laterality: Left;  SFA  . THYROID SURGERY      Family History  Problem Relation Age of Onset  . Heart disease Mother   . Pneumonia Father   . Diabetes Maternal Aunt   . Diabetes Maternal Uncle     Social History:  reports that he has been smoking Cigarettes.  He has a 33.75 pack-year smoking history. He has never used smokeless tobacco. He reports that he does not drink alcohol or use drugs.  Allergies:  Allergies  Allergen Reactions  . No Known Allergies     Medications:  I have reviewed the patient's current medications. Prior to Admission:  Prescriptions Prior to Admission  Medication Sig Dispense Refill Last Dose  . acetaminophen (TYLENOL) 325 MG tablet Take 2 tablets (650 mg total) by mouth every 6 (six) hours as needed for mild pain (or Fever >/= 101).     [name] aspirin EC 325 MG EC tablet Take 1 tablet (325 mg total) by mouth daily. 30 tablet 0   . carvedilol (COREG) 3.125 MG tablet Take 1 tablet (3.125 mg total) by mouth 2 (two) times daily with a meal. 60 tablet 0 08/08/2016 at 800a  . COLCRYS 0.6 MG tablet Take 0.6 mg by mouth 2 (two) times daily.  1 08/08/2016 at Unknown time  . feeding supplement, GLUCERNA SHAKE, ([place]) LIQD  Take 237 mLs by mouth 2 (two) times daily between meals.  0   . Insulin Glargine (LANTUS SOLOSTAR) 100 UNIT/ML Solostar Pen Inject 10 Units into the skin daily. 15 mL 11   . levothyroxine (SYNTHROID, LEVOTHROID) 100 MCG tablet Take 1 tablet (100 mcg total) by mouth daily before breakfast. 30 tablet 0 08/08/2016 at Unknown time  . lisinopril (PRINIVIL,ZESTRIL) 10 MG tablet Take 1 tablet (10 mg total) by mouth daily. 30 tablet 0 08/08/2016 at Unknown time  . metFORMIN (GLUCOPHAGE) 500 MG tablet Take 1 tablet (500 mg total) by mouth daily with breakfast. 30 tablet 2 08/08/2016 at Unknown time   . oxyCODONE-acetaminophen (PERCOCET/ROXICET) 5-325 MG tablet Take 1-2 tablets by mouth every 4 (four) hours as needed for moderate pain. 30 tablet 0   . polyethylene glycol (MIRALAX / GLYCOLAX) packet Take 17 g by mouth daily. 14 each 0   . pregabalin (LYRICA) 200 MG capsule Take 2 capsules (400 mg total) by mouth daily. (Patient taking differently: Take 200 mg by mouth 2 (two) times daily. ) 60 capsule 2 08/08/2016 at Unknown time  . triamterene-hydrochlorothiazide (MAXZIDE-25) 37.5-25 MG tablet Take 1 tablet by mouth daily. For blood pressure and fluid 90 tablet 3 08/08/2016 at Unknown time    Results for orders placed or performed during the hospital encounter of 08/30/16 (from the past 48 hour(s))  Glucose, capillary     Status: None   Collection Time: 08/30/16  9:36 PM  Result Value Ref Range   Glucose-Capillary 88 65 - 99 mg/dL   Comment 1 Notify RN     No results found.  ROS Blood pressure 115/76, pulse 75, resp. rate 17, SpO2 100 %. Physical Exam Physical Examination: General appearance - chronically ill appearing and cachectic , no distress , confused Mental status - confused, obeys some commands,  Eyes - HTN changes Mouth - upper plate Neck - adenopathy noted PCL Lymphatics - posterior cervical nodes Chest - clear to auscultation, no wheezes, rales or rhonchi, symmetric air entry, decreased air entry noted bilat Heart - S1 and S2 normal, systolic murmur KW4/0 at 2nd left intercostal space Abdomen - soft, nontender, nondistended, no masses or organomegaly, abdm bruit Liver down 4 cm Neurological - moves all extrem, sym facies, Ox2, asterixis Extremities - bilat fem bruits, bilat BKAs, still with staples, some black edges of incisions but overall clean and no d/c Skin - dry scaling, moles, skin tags  Assessment/Plan: 1 AKI  Most likely vol decrease, in setting of ACEI.  Cannot r/o other with current info.  bp more stable but will need to lower solute, K, acid load.   (consider obstruction,).  [place]. 2 PVD 3 Hypertension: not an issue 4. Anemia check Fe 5. Confusion r/o sepsis, prob uremia 6 DM will control P CRRt, cultures, ivf, foley, u/s  Kamaryn Grimley L 08/30/2016, 10:24 PM

## 2016-08-30 NOTE — H&P (Signed)
PULMONARY / CRITICAL CARE MEDICINE   Name: Todd [name] MRN: [id number] DOB: April 30, 1956    ADMISSION DATE:  08/30/2016 CONSULTATION DATE:  08/30/2016   REFERRING MD: Dr. Felton Clinton [facility])   CHIEF COMPLAINT:  AMS/AKI/Hyperkalemia   HISTORY OF PRESENT ILLNESS:   61 year old male with PMH of DM with peripheral neuropathy, Hyperlipidemia, Gout, HTN, Thyroid disease, with recent bilateral BKA (Left 3/2 and Right 3/5) secondary to ischemia with gangrenous changes. Presents to ED on 3/20 from SNF with reported 24 hours of hypotension. In ED labs revealed K 7.6 and Crt 5.66. Patient was given Bicarb gtt, Insulin/Dextrose/Ca, and Kayexalate, and transferred to [facility] for further workup via Nephrology. PCCM asked to admit.   Admission 2/26 -3/9 with leg ischemia related to gangrene. Completed antibiotic course. S/P BKA. Discharged to [facility] for Rehab.   PAST MEDICAL HISTORY :  He  has a past medical history of Diabetes mellitus without complication ([place]); Goiter; Gout; Hyperlipidemia; Hypertension; and Thyroid disease.  PAST SURGICAL HISTORY: He  has a past surgical history that includes Thyroid surgery; Lower Extremity Angiography (Bilateral, 08/11/2016); Abdominal Aortogram (N/A, 08/11/2016); Peripheral Vascular Balloon Angioplasty (Left, 08/11/2016); Amputation (Left, 08/12/2016); Abdominal Aortogram w/Lower Extremity (N/A, 08/15/2016); and Amputation (Right, 08/17/2016).  Allergies  Allergen Reactions  . No Known Allergies     No current facility-administered medications on file prior to encounter.    Current Outpatient Prescriptions on File Prior to Encounter  Medication Sig  . acetaminophen (TYLENOL) 325 MG tablet Take 2 tablets (650 mg total) by mouth every 6 (six) hours as needed for mild pain (or Fever >/= 101).  [name] aspirin EC 325 MG EC tablet Take 1 tablet (325 mg total) by mouth daily.  . carvedilol (COREG) 3.125 MG tablet Take 1 tablet (3.125 mg total) by mouth 2 (two) times  daily with a meal.  . COLCRYS 0.6 MG tablet Take 0.6 mg by mouth 2 (two) times daily.  . feeding supplement, GLUCERNA SHAKE, (GLUCERNA SHAKE) LIQD Take 237 mLs by mouth 2 (two) times daily between meals.  . Insulin Glargine (LANTUS SOLOSTAR) 100 UNIT/ML Solostar Pen Inject 10 Units into the skin daily.  [name] levothyroxine (SYNTHROID, LEVOTHROID) 100 MCG tablet Take 1 tablet (100 mcg total) by mouth daily before breakfast.  . lisinopril (PRINIVIL,ZESTRIL) 10 MG tablet Take 1 tablet (10 mg total) by mouth daily.  . metFORMIN (GLUCOPHAGE) 500 MG tablet Take 1 tablet (500 mg total) by mouth daily with breakfast.  . oxyCODONE-acetaminophen (PERCOCET/ROXICET) 5-325 MG tablet Take 1-2 tablets by mouth every 4 (four) hours as needed for moderate pain.  . polyethylene glycol (MIRALAX / GLYCOLAX) packet Take 17 g by mouth daily.  . pregabalin (LYRICA) 200 MG capsule Take 2 capsules (400 mg total) by mouth daily. (Patient taking differently: Take 200 mg by mouth 2 (two) times daily. )  . triamterene-hydrochlorothiazide (MAXZIDE-25) 37.5-25 MG tablet Take 1 tablet by mouth daily. For blood pressure and fluid    FAMILY HISTORY:  His indicated that his mother is alive. He indicated that his father is deceased. He indicated that all of his three sisters are alive. He indicated that his brother is deceased. He indicated that his maternal aunt is alive. He indicated that his maternal uncle is alive.    SOCIAL HISTORY: He  reports that he has been smoking Cigarettes.  He has a 33.75 pack-year smoking history. He has never used smokeless tobacco. He reports that he does not drink alcohol or use drugs.  REVIEW  OF SYSTEMS:   Unable to review as patient is confused.   SUBJECTIVE:  On Bicarb gtt. Denies pain. No distress   VITAL SIGNS: There were no vitals taken for this visit.  HEMODYNAMICS:    VENTILATOR SETTINGS:    INTAKE / OUTPUT: No intake/output data recorded.  PHYSICAL EXAMINATION: General:   Adult male, no distress  Neuro:  Alert, oriented to self only, follows commands  HEENT:  Normocephalic  Cardiovascular:  RRR, no MRG, NI S1/S2 Lungs:  Clear Breath sounds, non-labored  Abdomen:  Non-tender, non-distended, active bowel sounds  Musculoskeletal:  No acute  Skin:  Warm, dry, intact   LABS:  BMET No results for input(s): NA, K, CL, CO2, BUN, CREATININE, GLUCOSE in the last 168 hours.  Electrolytes No results for input(s): CALCIUM, MG, PHOS in the last 168 hours.  CBC No results for input(s): WBC, HGB, HCT, PLT in the last 168 hours.  Coag's No results for input(s): APTT, INR in the last 168 hours.  Sepsis Markers No results for input(s): LATICACIDVEN, PROCALCITON, O2SATVEN in the last 168 hours.  ABG No results for input(s): PHART, PCO2ART, PO2ART in the last 168 hours.  Liver Enzymes No results for input(s): AST, ALT, ALKPHOS, BILITOT, ALBUMIN in the last 168 hours.  Cardiac Enzymes No results for input(s): TROPONINI, PROBNP in the last 168 hours.  Glucose  Recent Labs Lab 08/30/16 2136  GLUCAP 88    Imaging No results found.   STUDIES:  CXR 3/20 > Minor bibasilar atelectasis, no acute process   CULTURES: Blood 3/20 >   ANTIBIOTICS: Zosyn 3/20 > 3/20  SIGNIFICANT EVENTS: 3/20 > Presents to [facility] ED from SNF for hypotension > transferred to [facility]  LINES/TUBES:   DISCUSSION: 61 year old male presents to ED from SNF with hypotension. Found to be hyperkalemic with AKI and encephalopathic. Transferred to [facility] for further workup from Nephrology.   ASSESSMENT / PLAN:  PULMONARY A: At risk for intubation secondary to encephalopathy  P:   Maintain Oxygen >92 Pulmonary Hygiene  CARDIOVASCULAR A:  Septic Shock  Elevated Troponin  H/O HTN, Hyperlipidemia  P:  Cardiac Monitoring  Maintain MAP >65 Continue ASA  ECHO pending  Trend Troponin  Hold home Lisinopril and Coreg in setting of hypotension   RENAL A:    Hyperkalemia s/p temporization  -EKG with NSR with peaked T-waves  AKI (Base Crt .8-1.1) P:   Trend BMP Continue Bicarb infusion @ 125 ml/hr Nephrology Consulted   GASTROINTESTINAL A:   No issues  P:   NPO PPI  HEMATOLOGIC A:   Thrombocytosis (Suspected related to AKI) P:  Trend CBC  INFECTIOUS A:   Recent Bilateral BKA Leukocytosis  P:   Trend WBC and Fever Curve  Trend Lactic Acid and Procal  Will monitor off antibiotics   ENDOCRINE A:   Hyperglycemia  Hypothyroidism  H/O DM P:   SSI Q4H Glucose Check Hold home Metformin   NEUROLOGIC A:   Acute Encephalopathy secondary to uremia  P:   Monitor  Will began CRRT Hold home Lyrica RASS goal: 0   FAMILY  - Updates: No family at bedside.   - Inter-disciplinary family meet or Palliative Care meeting due by:  09/06/2016    Pulmonary and [place] Pager: [phone]  08/30/2016, 9:42 PM

## 2016-08-30 NOTE — Procedures (Signed)
Hemodialysis Catheter Insertion Procedure Note Jerimie Mancuso [id number] [birth year]  Procedure: Insertion of Hemodialysis Catheter Indications: Hemodialysis  Procedure Details Consent: Unable to obtain consent because of emergent medical necessity.  Time Out: Verified patient identification, verified procedure, site/side was marked, verified correct patient position, special equipment/implants available, medications/allergies/relevent history reviewed, required imaging and test results available.  Performed  Maximum sterile technique was used including antiseptics, cap, gloves, gown, hand hygiene, mask and sheet.  Skin prep: Chlorhexidine; local anesthetic administered  A Trialysis HD catheter was placed in the right internal jugular vein using the Seldinger technique.  Evaluation Blood flow good Complications: No apparent complications Patient did tolerate procedure well. Chest X-ray ordered to verify placement.  CXR: pending.   Hayden Pedro, AG-ACNP [facility]  Pgr: [phone]  PCCM Pgr: [phone]

## 2016-08-31 ENCOUNTER — Inpatient Hospital Stay (HOSPITAL_COMMUNITY): Payer: BLUE CROSS/BLUE SHIELD

## 2016-08-31 LAB — GLUCOSE, CAPILLARY
GLUCOSE-CAPILLARY: 75 mg/dL (ref 65–99)
Glucose-Capillary: 100 mg/dL — ABNORMAL HIGH (ref 65–99)
Glucose-Capillary: 107 mg/dL — ABNORMAL HIGH (ref 65–99)
Glucose-Capillary: 136 mg/dL — ABNORMAL HIGH (ref 65–99)
Glucose-Capillary: 96 mg/dL (ref 65–99)
Glucose-Capillary: 96 mg/dL (ref 65–99)
Glucose-Capillary: 98 mg/dL (ref 65–99)

## 2016-08-31 LAB — RENAL FUNCTION PANEL
ANION GAP: 12 (ref 5–15)
ANION GAP: 8 (ref 5–15)
Albumin: 2.3 g/dL — ABNORMAL LOW (ref 3.5–5.0)
Albumin: 2 g/dL — ABNORMAL LOW (ref 3.5–5.0)
Albumin: 2.1 g/dL — ABNORMAL LOW (ref 3.5–5.0)
Anion gap: 16 — ABNORMAL HIGH (ref 5–15)
BUN: 101 mg/dL — ABNORMAL HIGH (ref 6–20)
BUN: 65 mg/dL — AB (ref 6–20)
BUN: 23 mg/dL — ABNORMAL HIGH (ref 6–20)
CALCIUM: 8.6 mg/dL — AB (ref 8.9–10.3)
CHLORIDE: 102 mmol/L (ref 101–111)
CHLORIDE: 99 mmol/L — AB (ref 101–111)
CHLORIDE: 99 mmol/L — AB (ref 101–111)
CO2: 17 mmol/L — AB (ref 22–32)
CO2: 25 mmol/L (ref 22–32)
CO2: 28 mmol/L (ref 22–32)
Calcium: 8.3 mg/dL — ABNORMAL LOW (ref 8.9–10.3)
Calcium: 8.1 mg/dL — ABNORMAL LOW (ref 8.9–10.3)
Creatinine, Ser: 4.99 mg/dL — ABNORMAL HIGH (ref 0.61–1.24)
Creatinine, Ser: 3.2 mg/dL — ABNORMAL HIGH (ref 0.61–1.24)
Creatinine, Ser: 1.58 mg/dL — ABNORMAL HIGH (ref 0.61–1.24)
GFR calc Af Amer: 13 mL/min — ABNORMAL LOW (ref >60)
GFR calc Af Amer: 23 mL/min — ABNORMAL LOW (ref >60)
GFR calc Af Amer: 53 mL/min — ABNORMAL LOW (ref >60)
GFR calc non Af Amer: 11 mL/min — ABNORMAL LOW (ref >60)
GFR calc non Af Amer: 19 mL/min — ABNORMAL LOW (ref >60)
GFR calc non Af Amer: 46 mL/min — ABNORMAL LOW (ref >60)
GLUCOSE: 86 mg/dL (ref 65–99)
GLUCOSE: 109 mg/dL — AB (ref 65–99)
Glucose, Bld: 96 mg/dL (ref 65–99)
POTASSIUM: 6.1 mmol/L — AB (ref 3.5–5.1)
POTASSIUM: 3.8 mmol/L (ref 3.5–5.1)
POTASSIUM: 4.3 mmol/L (ref 3.5–5.1)
Phosphorus: 7.4 mg/dL — ABNORMAL HIGH (ref 2.5–4.6)
Phosphorus: 4.6 mg/dL (ref 2.5–4.6)
Phosphorus: 2.9 mg/dL (ref 2.5–4.6)
SODIUM: 135 mmol/L (ref 135–145)
Sodium: 135 mmol/L (ref 135–145)
Sodium: 136 mmol/L (ref 135–145)

## 2016-08-31 LAB — HEPATIC FUNCTION PANEL
ALBUMIN: 2.1 g/dL — AB (ref 3.5–5.0)
ALT: 60 U/L (ref 17–63)
AST: 88 U/L — ABNORMAL HIGH (ref 15–41)
Alkaline Phosphatase: 133 U/L — ABNORMAL HIGH (ref 38–126)
BILIRUBIN INDIRECT: 0.6 mg/dL (ref 0.3–0.9)
Bilirubin, Direct: 0.3 mg/dL (ref 0.1–0.5)
Total Bilirubin: 0.9 mg/dL (ref 0.3–1.2)
Total Protein: 8.3 g/dL — ABNORMAL HIGH (ref 6.5–8.1)

## 2016-08-31 LAB — CBC
HEMATOCRIT: 32.7 % — AB (ref 39.0–52.0)
HEMOGLOBIN: 10.8 g/dL — AB (ref 13.0–17.0)
MCH: 27.3 pg (ref 26.0–34.0)
MCHC: 33 g/dL (ref 30.0–36.0)
MCV: 82.6 fL (ref 78.0–100.0)
Platelets: 651 10*3/uL — ABNORMAL HIGH (ref 150–400)
RBC: 3.96 MIL/uL — AB (ref 4.22–5.81)
RDW: 14 % (ref 11.5–15.5)
WBC: 19.5 10*3/uL — ABNORMAL HIGH (ref 4.0–10.5)

## 2016-08-31 LAB — TROPONIN I
Troponin I: 0.03 ng/mL (ref <0.03)
Troponin I: 0.04 ng/mL (ref <0.03)

## 2016-08-31 LAB — IRON AND TIBC
Iron: 31 ug/dL — ABNORMAL LOW (ref 45–182)
SATURATION RATIOS: 13 % — AB (ref 17.9–39.5)
TIBC: 237 ug/dL — AB (ref 250–450)
UIBC: 206 ug/dL

## 2016-08-31 LAB — MRSA PCR SCREENING: MRSA BY PCR: POSITIVE — AB

## 2016-08-31 LAB — POCT ACTIVATED CLOTTING TIME
ACTIVATED CLOTTING TIME: 142 s
ACTIVATED CLOTTING TIME: 158 s
ACTIVATED CLOTTING TIME: 158 s
ACTIVATED CLOTTING TIME: 158 s
ACTIVATED CLOTTING TIME: 169 s
ACTIVATED CLOTTING TIME: 169 s
ACTIVATED CLOTTING TIME: 169 s
ACTIVATED CLOTTING TIME: 175 s
ACTIVATED CLOTTING TIME: 180 s
ACTIVATED CLOTTING TIME: 197 s
ACTIVATED CLOTTING TIME: 197 s
Activated Clotting Time: 153 seconds
Activated Clotting Time: 175 seconds
Activated Clotting Time: 191 seconds
Activated Clotting Time: 197 seconds

## 2016-08-31 LAB — APTT: APTT: 23 s — AB (ref 24–36)

## 2016-08-31 LAB — PROCALCITONIN: PROCALCITONIN: 0.48 ng/mL

## 2016-08-31 LAB — LACTIC ACID, PLASMA: Lactic Acid, Venous: 1.9 mmol/L (ref 0.5–1.9)

## 2016-08-31 LAB — PATHOLOGIST SMEAR REVIEW: PATH REVIEW: REACTIVE

## 2016-08-31 LAB — CK: CK TOTAL: 3569 U/L — AB (ref 49–397)

## 2016-08-31 LAB — MAGNESIUM: Magnesium: 2.4 mg/dL (ref 1.7–2.4)

## 2016-08-31 MED ORDER — TRIAMTERENE-HCTZ 37.5-25 MG PO TABS
1.0000 | ORAL_TABLET | Freq: Every day | ORAL | Status: DC
  Administered 2016-08-31: 1 via ORAL
  Filled 2016-08-31 (×2): qty 1

## 2016-08-31 MED ORDER — PRISMASOL BGK 4/2.5 32-4-2.5 MEQ/L IV SOLN
INTRAVENOUS | Status: DC
  Administered 2016-08-31: 08:00:00 via INTRAVENOUS_CENTRAL
  Filled 2016-08-31 (×5): qty 5000

## 2016-08-31 MED ORDER — ORAL CARE MOUTH RINSE
15.0000 mL | Freq: Two times a day (BID) | OROMUCOSAL | Status: DC
  Administered 2016-08-31 – 2016-09-01 (×3): 15 mL via OROMUCOSAL

## 2016-08-31 MED ORDER — HYDRALAZINE HCL 20 MG/ML IJ SOLN: 10.0000 mg | INTRAMUSCULAR | Status: DC | PRN

## 2016-08-31 MED ORDER — CARVEDILOL 3.125 MG PO TABS
3.1250 mg | ORAL_TABLET | Freq: Two times a day (BID) | ORAL | Status: DC
  Administered 2016-08-31 – 2016-09-03 (×2): 3.125 mg via ORAL
  Filled 2016-08-31 (×5): qty 1

## 2016-08-31 MED ORDER — PRISMASOL BGK 4/2.5 32-4-2.5 MEQ/L IV SOLN
INTRAVENOUS | Status: DC
  Administered 2016-08-31 (×3): via INTRAVENOUS_CENTRAL
  Filled 2016-08-31 (×14): qty 5000

## 2016-08-31 MED ORDER — PRISMASOL BGK 4/2.5 32-4-2.5 MEQ/L IV SOLN
INTRAVENOUS | Status: DC
  Administered 2016-08-31 (×2): via INTRAVENOUS_CENTRAL
  Filled 2016-08-31 (×6): qty 5000

## 2016-08-31 MED ORDER — MUPIROCIN 2 % EX OINT
1.0000 "application " | TOPICAL_OINTMENT | Freq: Two times a day (BID) | CUTANEOUS | Status: AC
  Administered 2016-08-31 – 2016-09-04 (×10): 1 via NASAL
  Filled 2016-08-31 (×5): qty 22

## 2016-08-31 MED ORDER — CHLORHEXIDINE GLUCONATE CLOTH 2 % EX PADS
6.0000 | MEDICATED_PAD | Freq: Every day | CUTANEOUS | Status: AC
  Administered 2016-08-30 – 2016-09-04 (×5): 6 via TOPICAL

## 2016-08-31 NOTE — Progress Notes (Signed)
Subjective:  Pt consulted on last night by Dr. Jimmy Footman- note reviewed- started on CRRT for hyperkalemia  which has corrected this AM- more alert and making urine  Objective Vital signs in last 24 hours: Vitals:   08/31/16 0500 08/31/16 0600 08/31/16 0700 08/31/16 0722  BP: 117/76 (!) 115/98 138/90   Pulse: (!) 59 77    Resp: 15 11 13    Temp:    97.8 F (36.6 C)  TempSrc:    Oral  SpO2: 94% 99%    Weight:       Weight change:   Intake/Output Summary (Last 24 hours) at 08/31/16 0805 Last data filed at 08/31/16 0700  Gross per 24 hour  Intake           1214.5 ml  Output             1167 ml  Net             47.5 ml    Assessment/ Plan: Pt is a 61 y.o. yo male who was admitted on 08/30/2016 with hypotension, lethargy found to be in AKI with hyperkalemia- was on lisinopril/triamterene/ HCTZ  Assessment/Plan: 1. Renal- AKI (crt 3/8 was 0.86)  Likely due to ATN from hypotension/ACE/volume depletion.  Started on CRRT for hyperkalemia which has resolved- also making urine 25-90 ccs per hour.  Hopefully this is going to be short lived AKI- nurse tells me that machine is clotting- BP now better so will go ahead and hold CRRT and follow 2. HTN/vol- initially hypotensive- now better with IVF only- did not require pressors.  WOULD NEVER PUT THIS MAN ON LISINOPRIL AGAIN !  3. Anemia- reasonable at this time 4. Hyperkalemia- resolved   Derric Dealmeida A    Labs: Basic Metabolic Panel:  Recent Labs Lab 08/30/16 2155 08/31/16 0131 08/31/16 0447  NA 135 135 136  K 6.4* 6.1* 3.8  CL 103 102 99*  CO2 15* 17* 25  GLUCOSE 87 86 109*  BUN 110* 101* 65*  CREATININE 5.68* 4.99* 3.20*  CALCIUM 8.7* 8.6* 8.3*  PHOS 8.2* 7.4* 4.6   Liver Function Tests:  Recent Labs Lab 08/30/16 2330 08/31/16 0131 08/31/16 0447  AST 88*  --   --   ALT 60  --   --   ALKPHOS 133*  --   --   BILITOT 0.9  --   --   PROT 8.3*  --   --   ALBUMIN 2.1* 2.3* 2.0*   No results for input(s): LIPASE,  AMYLASE in the last 168 hours. No results for input(s): AMMONIA in the last 168 hours. CBC:  Recent Labs Lab 08/30/16 2155 08/31/16 0131  WBC 20.5* 19.5*  HGB 9.2* 10.8*  HCT 28.5* 32.7*  MCV 82.8 82.6  PLT 1,009* 651*   Cardiac Enzymes:  Recent Labs Lab 08/30/16 2155 08/31/16 0131  CKTOTAL  --  3,569*  TROPONINI 0.04* 0.03*   CBG:  Recent Labs Lab 08/30/16 2136 08/31/16 0015 08/31/16 0359 08/31/16 0720  GLUCAP 88 100* 96 96    Iron Studies: No results for input(s): IRON, TIBC, TRANSFERRIN, FERRITIN in the last 72 hours. Studies/Results: US Renal Port  Result Date: 08/31/2016 CLINICAL DATA:  Acute onset of renal insufficiency. Initial encounter. EXAM: RENAL / URINARY TRACT ULTRASOUND COMPLETE COMPARISON:  Renal ultrasound performed 07/04/2016 FINDINGS: Right Kidney: Length: 11.6 cm. Echogenicity within normal limits. No mass or hydronephrosis visualized. Left Kidney: Length: 11.8 cm. Echogenicity within normal limits. No mass or hydronephrosis visualized. Bladder: Decompressed, with a  Foley catheter in place. IMPRESSION: Unremarkable renal ultrasound.  No evidence of hydronephrosis. Electronically Signed   By: Garald Balding M.D.   On: 08/31/2016 03:13   Dg Chest Port 1 View  Result Date: 08/31/2016 CLINICAL DATA:  61 year old male with central line placement. EXAM: PORTABLE CHEST 1 VIEW COMPARISON:  Chest radiograph dated 08/30/2016 FINDINGS: There has been interval placement of a right IJ central line with tip over central SVC. There is no pneumothorax. Minimal bibasilar linear and platelike atelectatic changes noted. No focal consolidation, pleural effusion. The cardiac silhouette is within normal limits. No acute osseous pathology. Multiple surgical clips at the base of the neck likely from prior thyroid surgery. IMPRESSION: Interval placement of a right IJ central line with tip over central SVC. No pneumothorax. Electronically Signed   By: Anner Crete M.D.   On:  08/31/2016 00:29   Medications: Infusions: . heparin 10,000 units/ 20 mL infusion syringe 950 Units/hr (08/31/16 0727)  . dialysis replacement fluid (prismasate) 800 mL/hr at 08/31/16 0731  . dialysis replacement fluid (prismasate) 700 mL/hr at 08/31/16 0731  . dialysate (PRISMASATE) 2,000 mL/hr at 08/31/16 0731  .  sodium bicarbonate  infusion 1000 mL 125 mL/hr at 08/31/16 0427    Scheduled Medications: . aspirin  325 mg Oral Daily  . Chlorhexidine Gluconate Cloth  6 each Topical Q0600  . heparin  5,000 Units Subcutaneous Q8H  . insulin aspart  2-6 Units Subcutaneous Q4H  . levothyroxine  50 mcg Intravenous Daily  . mouth rinse  15 mL Mouth Rinse BID  . mupirocin ointment  1 application Nasal BID    have reviewed scheduled and prn medications.  Physical Exam: General: more alert than noted in chart - "[facility]" Heart: RRR Lungs: mostly clear Abdomen: soft, non tender Extremities: no edema Dialysis Access: right IJ vascath placed 3/20    08/31/2016,8:05 AM  LOS: 1 day

## 2016-08-31 NOTE — Progress Notes (Signed)
PULMONARY / CRITICAL CARE MEDICINE   Name: Todd [name] MRN: [id number] DOB: February 12, 1956    ADMISSION DATE:  08/30/2016 CONSULTATION DATE:  08/30/2016   REFERRING MD: Dr. Felton Clinton [facility])   CHIEF COMPLAINT:  AMS/AKI/Hyperkalemia    SUBJECTIVE:  Hyperkalemia resolved after CRRT.  No additional events.  VITAL SIGNS: BP 136/89   Pulse 87   Temp 97.8 F (36.6 C) (Oral)   Resp 14   Wt 60 kg (132 lb 4.4 oz)   SpO2 100%   BMI 20.11 kg/m   HEMODYNAMICS:    VENTILATOR SETTINGS:    INTAKE / OUTPUT: I/O last 3 completed shifts: In: 1214.5 [I.V.:1214.5] Out: 57 [Urine:410; Other:757]  PHYSICAL EXAMINATION: General:  Adult male, no distress.  Neuro:  A&O x 3, no deficits. HEENT:  [place]/AT, PERRL.  Cardiovascular:  RRR, no MRG. Lungs:  Clear Breath sounds, non-labored.  Abdomen:  Non-tender, non-distended, active bowel sounds.  Musculoskeletal:  B/l BKA's. Skin:  Warm, dry, intact.  LABS:  BMET  Recent Labs Lab 08/30/16 2155 08/31/16 0131 08/31/16 0447  NA 135 135 136  K 6.4* 6.1* 3.8  CL 103 102 99*  CO2 15* 17* 25  BUN 110* 101* 65*  CREATININE 5.68* 4.99* 3.20*  GLUCOSE 87 86 109*    Electrolytes  Recent Labs Lab 08/30/16 2155 08/31/16 0131 08/31/16 0447  CALCIUM 8.7* 8.6* 8.3*  MG 2.4 2.4  --   PHOS 8.2* 7.4* 4.6    CBC  Recent Labs Lab 08/30/16 2155 08/31/16 0131  WBC 20.5* 19.5*  HGB 9.2* 10.8*  HCT 28.5* 32.7*  PLT 1,009* 651*    Coag's  Recent Labs Lab 08/30/16 2155 08/31/16 0131  APTT  --  23*  INR 1.22  --     Sepsis Markers  Recent Labs Lab 08/30/16 2155 08/31/16 0131  LATICACIDVEN 1.3 1.9  PROCALCITON 0.68 0.48    ABG No results for input(s): PHART, PCO2ART, PO2ART in the last 168 hours.  Liver Enzymes  Recent Labs Lab 08/30/16 2330 08/31/16 0131 08/31/16 0447  AST 88*  --   --   ALT 60  --   --   ALKPHOS 133*  --   --   BILITOT 0.9  --   --   ALBUMIN 2.1* 2.3* 2.0*    Cardiac  Enzymes  Recent Labs Lab 08/30/16 2155 08/31/16 0131 08/31/16 0757  TROPONINI 0.04* 0.03* 0.04*    Glucose  Recent Labs Lab 08/30/16 2136 08/31/16 0015 08/31/16 0359 08/31/16 0720  GLUCAP 88 100* 96 96    Imaging US Renal Port  Result Date: 08/31/2016 CLINICAL DATA:  Acute onset of renal insufficiency. Initial encounter. EXAM: RENAL / URINARY TRACT ULTRASOUND COMPLETE COMPARISON:  Renal ultrasound performed 07/04/2016 FINDINGS: Right Kidney: Length: 11.6 cm. Echogenicity within normal limits. No mass or hydronephrosis visualized. Left Kidney: Length: 11.8 cm. Echogenicity within normal limits. No mass or hydronephrosis visualized. Bladder: Decompressed, with a Foley catheter in place. IMPRESSION: Unremarkable renal ultrasound.  No evidence of hydronephrosis. Electronically Signed   By: Garald Balding M.D.   On: 08/31/2016 03:13   Dg Chest Port 1 View  Result Date: 08/31/2016 CLINICAL DATA:  61 year old male with central line placement. EXAM: PORTABLE CHEST 1 VIEW COMPARISON:  Chest radiograph dated 08/30/2016 FINDINGS: There has been interval placement of a right IJ central line with tip over central SVC. There is no pneumothorax. Minimal bibasilar linear and platelike atelectatic changes noted. No focal consolidation, pleural effusion. The cardiac silhouette is within normal limits.  No acute osseous pathology. Multiple surgical clips at the base of the neck likely from prior thyroid surgery. IMPRESSION: Interval placement of a right IJ central line with tip over central SVC. No pneumothorax. Electronically Signed   By: Anner Crete M.D.   On: 08/31/2016 00:29     STUDIES:  CXR 3/20 > Minor bibasilar atelectasis, no acute process  Renal US 3/21 > unremarkable.  CULTURES: Blood 3/20 >   ANTIBIOTICS: Zosyn 3/20 > 3/20  SIGNIFICANT EVENTS: 3/20 > admit. Admission 2/26 -3/9 with leg ischemia related to gangrene. Completed antibiotic course. S/P  BKA.  LINES/TUBES:   DISCUSSION: 61 year old male presents to ED from SNF with hypotension. Found to be hyperkalemia with AKI and encephalopathy. Transferred to [facility] for CRRT.  ASSESSMENT / PLAN:  PULMONARY A: No acute issues. P:   No interventions required.  CARDIOVASCULAR A:  Hypotension - likely hypovolemic.  Initially had some concern for sepsis but does NOT appear to be sepsis at this point.  Now resolved 3/21 and currently hypertensive. Elevated Troponin - remained flat. H/O HTN, Hyperlipidemia.  P:  Continue to monitor. Continue ASA. Hydralazine PRN. Restart preadmission Coreg, maxzide. Hold preadmission Lisinopril given AKI / hyperkalemia - would avoid this in future and agree to not restart ever (have added to allergy list).  RENAL A:   Hyperkalemia - s/p CRTT.  Now resolved. AKI (Base Crt .8-1.1). P:   Decrease HCO3 to 50cc/hr - consider stopping 3/22. CRRT ongoing, likely to stop today. Nephrology following.   GASTROINTESTINAL A:   Nutrition. P:   Start heart healthy diet.  HEMATOLOGIC A:   Thrombocytosis (Suspected related to AKI). P:  Trend CBC.  INFECTIOUS A:   Leukocytosis - suspect acute phase reactant, no indication of infection. P:   Monitor clinically.  ENDOCRINE A:   Hyperglycemia  Hypothyroidism  H/O DM P:   SSI. Q4H Glucose Check. Hold home Metformin. Continue preadmission synthroid.  NEUROLOGIC A:   Acute Encephalopathy secondary to uremia - resolving. P:   Continue to monitor. Continue to hold preadmission lyrica.   FAMILY  - Updates: No family at bedside.   - Inter-disciplinary family meet or Palliative Care meeting due by:  09/06/2016   CC time: 30 min.   Montey Hora, [place] [facility] Pager: [phone]  or [phone] 08/31/2016, 12:28 PM  Attending Note:  I have examined patient, reviewed labs, studies and notes. I have discussed the case with Junius Roads,  and I agree with the data and plans as amended above. 61 year old man with a history of peripheral arterial disease and bilateral lower extremity ischemia, bilateral BKA's, brought for evaluation of confusion. He was found to have acute renal failure with associated hyperkalemia. He was admitted to the hospital for urgent continuous dialysis. On evaluation today he is somewhat lethargic but is easily awakened and answers questions appropriately. Dialysis is still running. His potassium has normalized, hypotension has improved. He has evolved hypertension. We will wean his bicarbonate, likely stop CVVHD today. Restart his home medications as he stabilizes from this acute event. Independent critical care time is 33 minutes.   Baltazar Apo, MD, PhD 08/31/2016, 4:10 PM [facility] [phone] or if no answer [phone]

## 2016-09-01 ENCOUNTER — Inpatient Hospital Stay (HOSPITAL_COMMUNITY): Payer: BLUE CROSS/BLUE SHIELD

## 2016-09-01 LAB — ECHOCARDIOGRAM COMPLETE: Weight: 2105.83 oz

## 2016-09-01 LAB — RENAL FUNCTION PANEL
ALBUMIN: 1.9 g/dL — AB (ref 3.5–5.0)
ANION GAP: 11 (ref 5–15)
BUN: 26 mg/dL — ABNORMAL HIGH (ref 6–20)
CALCIUM: 7.8 mg/dL — AB (ref 8.9–10.3)
CO2: 27 mmol/L (ref 22–32)
Chloride: 98 mmol/L — ABNORMAL LOW (ref 101–111)
Creatinine, Ser: 2.14 mg/dL — ABNORMAL HIGH (ref 0.61–1.24)
GFR, EST AFRICAN AMERICAN: 37 mL/min — AB (ref >60)
GFR, EST NON AFRICAN AMERICAN: 32 mL/min — AB (ref >60)
Glucose, Bld: 101 mg/dL — ABNORMAL HIGH (ref 65–99)
PHOSPHORUS: 3.5 mg/dL (ref 2.5–4.6)
Potassium: 4.2 mmol/L (ref 3.5–5.1)
SODIUM: 136 mmol/L (ref 135–145)

## 2016-09-01 LAB — GLUCOSE, CAPILLARY
GLUCOSE-CAPILLARY: 102 mg/dL — AB (ref 65–99)
GLUCOSE-CAPILLARY: 124 mg/dL — AB (ref 65–99)
GLUCOSE-CAPILLARY: 120 mg/dL — AB (ref 65–99)
Glucose-Capillary: 97 mg/dL (ref 65–99)
Glucose-Capillary: 94 mg/dL (ref 65–99)

## 2016-09-01 LAB — APTT: APTT: 37 s — AB (ref 24–36)

## 2016-09-01 LAB — CBC
HCT: 26.3 % — ABNORMAL LOW (ref 39.0–52.0)
HEMOGLOBIN: 8.4 g/dL — AB (ref 13.0–17.0)
MCH: 26.1 pg (ref 26.0–34.0)
MCHC: 31.6 g/dL (ref 30.0–36.0)
MCV: 82.7 fL (ref 78.0–100.0)
Platelets: 766 10*3/uL — ABNORMAL HIGH (ref 150–400)
RBC: 3.18 MIL/uL — AB (ref 4.22–5.81)
RDW: 13.8 % (ref 11.5–15.5)
WBC: 22.7 10*3/uL — ABNORMAL HIGH (ref 4.0–10.5)

## 2016-09-01 LAB — PROCALCITONIN: Procalcitonin: 0.59 ng/mL

## 2016-09-01 LAB — HEPATITIS B SURFACE ANTIGEN: Hepatitis B Surface Ag: NEGATIVE

## 2016-09-01 LAB — MAGNESIUM: MAGNESIUM: 2.1 mg/dL (ref 1.7–2.4)

## 2016-09-01 MED ORDER — WHITE PETROLATUM GEL
Status: AC
  Administered 2016-09-01: 21:00:00
  Filled 2016-09-01: qty 1

## 2016-09-01 MED ORDER — INSULIN ASPART 100 UNIT/ML ~~LOC~~ SOLN
0.0000 [IU] | Freq: Three times a day (TID) | SUBCUTANEOUS | Status: DC
  Administered 2016-09-02: 2 [IU] via SUBCUTANEOUS
  Administered 2016-09-03: 1 [IU] via SUBCUTANEOUS
  Administered 2016-09-03: 2 [IU] via SUBCUTANEOUS

## 2016-09-01 MED ORDER — SODIUM CHLORIDE 0.9 % IV SOLN: 250.0000 mL | INTRAVENOUS | Status: DC | PRN

## 2016-09-01 MED ORDER — INSULIN ASPART 100 UNIT/ML ~~LOC~~ SOLN
0.0000 [IU] | Freq: Every day | SUBCUTANEOUS | Status: DC
Start: 2016-09-01 — End: 2016-09-04

## 2016-09-01 NOTE — Progress Notes (Addendum)
Transfer   Arrival Method: stretcher  from 2 M  Mental Orientation: alert and orientated x2 to self and DOB  Telemetry:none  Assessment: Completed Skin: stage II to sacrum and bilateral BKA HY:HOOIL FA and left AC  Pain:denies  Tubes:  Safety Measures: Safety Fall Prevention Plan has been discussed  Admission: Completed [facility] [place] Orientation: Patient has been orientated to the room, unit and staff.  Family: none   Orders have been reviewed and implemented. Will continue to monitor the patient. Call light has been placed within reach and bed alarm has been activated.   Emilio Math, RN [facility] [place] [facility]  Phone number: [phone]

## 2016-09-01 NOTE — Progress Notes (Signed)
PULMONARY / CRITICAL CARE MEDICINE   Name: Todd [name] MRN: [id number] DOB: 04/02/1956    ADMISSION DATE:  08/30/2016 CONSULTATION DATE:  08/30/2016   REFERRING MD: Dr. Felton Clinton [facility])   CHIEF COMPLAINT:  AMS/AKI/Hyperkalemia    SUBJECTIVE:  No acute events.  CRRT stopped yesterday 3/21.  VITAL SIGNS: BP (!) 107/96   Pulse 87   Temp 98.5 F (36.9 C) (Oral)   Resp 16   Wt 59.7 kg (131 lb 9.8 oz)   SpO2 100%   BMI 20.01 kg/m   HEMODYNAMICS:    VENTILATOR SETTINGS:    INTAKE / OUTPUT: I/O last 3 completed shifts: In: 2969.5 [I.V.:2969.5] Out: 0109 [Urine:1570; NATFT:7322; Stool:2]  PHYSICAL EXAMINATION: General:  Adult male, no distress.  Neuro:  Awake but somewhat confused.  Follows basic commands. HEENT:  [place]/AT, PERRL.  Cardiovascular:  RRR, no MRG. Lungs:  Clear Breath sounds, non-labored.  Abdomen:  Non-tender, non-distended, active bowel sounds.  Musculoskeletal:  B/l BKA's. Skin:  Warm, dry, intact.  LABS:  BMET  Recent Labs Lab 08/31/16 0447 08/31/16 1600 09/01/16 0355  NA 136 135 136  K 3.8 4.3 4.2  CL 99* 99* 98*  CO2 25 28 27   BUN 65* 23* 26*  CREATININE 3.20* 1.58* 2.14*  GLUCOSE 109* 96 101*    Electrolytes  Recent Labs Lab 08/30/16 2155 08/31/16 0131 08/31/16 0447 08/31/16 1600 09/01/16 0355  CALCIUM 8.7* 8.6* 8.3* 8.1* 7.8*  MG 2.4 2.4  --   --  2.1  PHOS 8.2* 7.4* 4.6 2.9 3.5    CBC  Recent Labs Lab 08/30/16 2155 08/31/16 0131 09/01/16 0355  WBC 20.5* 19.5* 22.7*  HGB 9.2* 10.8* 8.4*  HCT 28.5* 32.7* 26.3*  PLT 1,009* 651* 766*    Coag's  Recent Labs Lab 08/30/16 2155 08/31/16 0131 09/01/16 0355  APTT  --  23* 37*  INR 1.22  --   --     Sepsis Markers  Recent Labs Lab 08/30/16 2155 08/31/16 0131 09/01/16 0355  LATICACIDVEN 1.3 1.9  --   PROCALCITON 0.68 0.48 0.59    ABG No results for input(s): PHART, PCO2ART, PO2ART in the last 168 hours.  Liver Enzymes  Recent Labs Lab  08/30/16 2330  08/31/16 0447 08/31/16 1600 09/01/16 0355  AST 88*  --   --   --   --   ALT 60  --   --   --   --   ALKPHOS 133*  --   --   --   --   BILITOT 0.9  --   --   --   --   ALBUMIN 2.1*  < > 2.0* 2.1* 1.9*  < > = values in this interval not displayed.  Cardiac Enzymes  Recent Labs Lab 08/30/16 2155 08/31/16 0131 08/31/16 0757  TROPONINI 0.04* 0.03* 0.04*    Glucose  Recent Labs Lab 08/31/16 1156 08/31/16 1542 08/31/16 1954 08/31/16 2316 09/01/16 0347 09/01/16 0733  GLUCAP 107* 75 136* 98 102* 124*    Imaging No results found.   STUDIES:  CXR 3/20 > Minor bibasilar atelectasis, no acute process  Renal US 3/21 > unremarkable.  CULTURES: Blood 3/20 >   ANTIBIOTICS: Zosyn 3/20 > 3/20  SIGNIFICANT EVENTS: 3/20 > admit. Admission 2/26 -3/9 with leg ischemia related to gangrene. Completed antibiotic course. S/P BKA.  LINES/TUBES:   DISCUSSION: 61 year old male presents to ED from SNF with hypotension. Found to be hyperkalemia with AKI and encephalopathy. Transferred to [facility]  for CRRT.  ASSESSMENT / PLAN:  PULMONARY A: No acute issues. P:   No interventions required.  CARDIOVASCULAR A:  Hypotension - likely hypovolemic.  Initially had some concern for sepsis but does NOT appear to be sepsis at this point.  Resolved 3/21 and hypertensive.  Borderline BP 3/22 after coreg given. Elevated Troponin - remained flat. H/O HTN, Hyperlipidemia.  P:  Continue to monitor. Continue ASA. Hydralazine PRN.  Continue preadmission Coreg (have asked RN to hold this AM dose given borderline BP). Hold preadmission Maxzide and Lisinopril given AKI / hyperkalemia - would avoid Lisinopril in future and agree to not restart ever (have added to allergy list).  RENAL A:   Hyperkalemia - s/p CRTT.  Now resolved. AKI (Base Crt .8-1.1). P:   HCO3 stopped. NS @ 75. Nephrology following.  Follow BMP.  GASTROINTESTINAL A:   Nutrition. P:   Continue  heart healthy diet.  HEMATOLOGIC A:   Thrombocytosis (Suspected related to AKI). P:  Trend CBC.  INFECTIOUS A:   Leukocytosis - suspect acute phase reactant, no indication of infection. P:   Monitor clinically.  ENDOCRINE A:   Hyperglycemia.  Hypothyroidism.  H/O DM. P:   SSI. Q4H Glucose Check. Hold home Metformin. Continue preadmission synthroid.  NEUROLOGIC A:   Acute Encephalopathy secondary to uremia - resolving but still altered AM 3/22. P:   Continue to monitor. Continue to hold preadmission lyrica.   FAMILY  - Updates: No family at bedside.   - Inter-disciplinary family meet or Palliative Care meeting due by:  09/06/2016   CC time: 30 min.   Montey Hora, [place] [facility] Pager: [phone]  or [phone] 09/01/2016, 8:11 AM  Attending Note:  I have examined patient, reviewed labs, studies and notes. I have discussed the case with Junius Roads, and I agree with the data and plans as amended above. 61 year old man with a history of peripheral arterial disease, admitted with acute renal failure and associated encephalopathy. He underwent urgent continuous dialysis for stabilization. His electrolytes and mental status have improved although he does have some residual confusion. CVVHD has been stopped. On evaluation he is awake, answers questions although slowly, clear breath sounds. Heart is regular. He has bilateral BKA's. Unclear as to whether he will achieve full renal recovery. Nephrology is following him. He may not require any further dialysis. We will transition him out of the ICU today.   Baltazar Apo, MD, PhD 09/01/2016, 1:41 PM [place] [facility] [phone] or if no answer [phone]

## 2016-09-01 NOTE — Progress Notes (Signed)
Echocardiogram 2D Echocardiogram has been performed.  Todd [name] 09/01/2016, 2:14 PM

## 2016-09-01 NOTE — Progress Notes (Signed)
Subjective:  CRRT stopped late yesterday afternoon- making good urine- over a liter  Objective Vital signs in last 24 hours: Vitals:   09/01/16 0345 09/01/16 0405 09/01/16 0500 09/01/16 0605  BP:  90/74 (!) 111/91 (!) 107/96  Pulse:  86 87 87  Resp:  (!) 21 (!) 21 16  Temp: 98.8 F (37.1 C)     TempSrc: Oral     SpO2:  100% 100% 100%  Weight: 59.7 kg (131 lb 9.8 oz)      Weight change: -0.3 kg (-10.6 oz)  Intake/Output Summary (Last 24 hours) at 09/01/16 0719 Last data filed at 09/01/16 0600  Gross per 24 hour  Intake             1755 ml  Output             2199 ml  Net             -444 ml    Assessment/ Plan: Pt is a 61 y.o. yo male who was admitted on 08/30/2016 with hypotension, lethargy found to be in AKI with hyperkalemia- was on lisinopril/triamterene/ HCTZ  Assessment/Plan: 1. Renal- AKI (crt 3/8 was 0.86)  Likely due to ATN from hypotension/ACE/volume depletion.  Started on CRRT for hyperkalemia 3/20 which has resolved- was on CRRT less than 24 hours.   making urine 25-90 ccs per hour.  Hopefully this is going to be short lived AKI- labs  Yesterday afternoon still under the influence of CRRT so not surprised to see some rebound.  No indications for HD today - will follow 2. HTN/vol- initially hypotensive- now better with IVF only- did not require pressors.  WOULD NEVER PUT THIS MAN ON LISINOPRIL AGAIN ! Will stop IV bicarb and just keep on some NS still- not sure is eating and drinking enough.  Stop the hctz- OK to keep very low dose coreg 3. Anemia- dropped last 24 hours- supportive care 4. Hyperkalemia- resolved   [place] A    Labs: Basic Metabolic Panel:  Recent Labs Lab 08/31/16 0447 08/31/16 1600 09/01/16 0355  NA 136 135 136  K 3.8 4.3 4.2  CL 99* 99* 98*  CO2 25 28 27   GLUCOSE 109* 96 101*  BUN 65* 23* 26*  CREATININE 3.20* 1.58* 2.14*  CALCIUM 8.3* 8.1* 7.8*  PHOS 4.6 2.9 3.5   Liver Function Tests:  Recent Labs Lab 08/30/16 2330   08/31/16 0447 08/31/16 1600 09/01/16 0355  AST 88*  --   --   --   --   ALT 60  --   --   --   --   ALKPHOS 133*  --   --   --   --   BILITOT 0.9  --   --   --   --   PROT 8.3*  --   --   --   --   ALBUMIN 2.1*  < > 2.0* 2.1* 1.9*  < > = values in this interval not displayed. No results for input(s): LIPASE, AMYLASE in the last 168 hours. No results for input(s): AMMONIA in the last 168 hours. CBC:  Recent Labs Lab 08/30/16 2155 08/31/16 0131 09/01/16 0355  WBC 20.5* 19.5* 22.7*  HGB 9.2* 10.8* 8.4*  HCT 28.5* 32.7* 26.3*  MCV 82.8 82.6 82.7  PLT 1,009* 651* 766*   Cardiac Enzymes:  Recent Labs Lab 08/30/16 2155 08/31/16 0131 08/31/16 0757  CKTOTAL  --  3,569*  --   TROPONINI 0.04* 0.03* 0.04*  CBG:  Recent Labs Lab 08/31/16 1156 08/31/16 1542 08/31/16 1954 08/31/16 2316 09/01/16 0347  GLUCAP 107* 75 136* 98 102*    Iron Studies:   Recent Labs  08/31/16 1134  IRON 31*  TIBC 237*   Studies/Results: US Renal Port  Result Date: 08/31/2016 CLINICAL DATA:  Acute onset of renal insufficiency. Initial encounter. EXAM: RENAL / URINARY TRACT ULTRASOUND COMPLETE COMPARISON:  Renal ultrasound performed 07/04/2016 FINDINGS: Right Kidney: Length: 11.6 cm. Echogenicity within normal limits. No mass or hydronephrosis visualized. Left Kidney: Length: 11.8 cm. Echogenicity within normal limits. No mass or hydronephrosis visualized. Bladder: Decompressed, with a Foley catheter in place. IMPRESSION: Unremarkable renal ultrasound.  No evidence of hydronephrosis. Electronically Signed   By: Garald Balding M.D.   On: 08/31/2016 03:13   Dg Chest Port 1 View  Result Date: 08/31/2016 CLINICAL DATA:  61 year old male with central line placement. EXAM: PORTABLE CHEST 1 VIEW COMPARISON:  Chest radiograph dated 08/30/2016 FINDINGS: There has been interval placement of a right IJ central line with tip over central SVC. There is no pneumothorax. Minimal bibasilar linear and platelike  atelectatic changes noted. No focal consolidation, pleural effusion. The cardiac silhouette is within normal limits. No acute osseous pathology. Multiple surgical clips at the base of the neck likely from prior thyroid surgery. IMPRESSION: Interval placement of a right IJ central line with tip over central SVC. No pneumothorax. Electronically Signed   By: Anner Crete M.D.   On: 08/31/2016 00:29   Medications: Infusions: . heparin 10,000 units/ 20 mL infusion syringe 1,400 Units/hr (08/31/16 1603)  . dialysis replacement fluid (prismasate) 800 mL/hr at 08/31/16 1351  . dialysis replacement fluid (prismasate) 700 mL/hr at 08/31/16 0731  . dialysate (PRISMASATE) 2,000 mL/hr at 08/31/16 1458  .  sodium bicarbonate  infusion 1000 mL 50 mL/hr at 08/31/16 1300    Scheduled Medications: . aspirin  325 mg Oral Daily  . carvedilol  3.125 mg Oral BID WC  . Chlorhexidine Gluconate Cloth  6 each Topical Q0600  . heparin  5,000 Units Subcutaneous Q8H  . insulin aspart  2-6 Units Subcutaneous Q4H  . levothyroxine  50 mcg Intravenous Daily  . mouth rinse  15 mL Mouth Rinse BID  . mupirocin ointment  1 application Nasal BID  . triamterene-hydrochlorothiazide  1 tablet Oral Daily    have reviewed scheduled and prn medications.  Physical Exam: General: more alert initially but then drifts off Heart: RRR Lungs: mostly clear Abdomen: soft, non tender Extremities: no edema Dialysis Access: right IJ vascath placed 3/20    09/01/2016,7:19 AM  LOS: 2 days

## 2016-09-02 ENCOUNTER — Encounter: Payer: Self-pay | Admitting: Vascular Surgery

## 2016-09-02 DIAGNOSIS — R931 Abnormal findings on diagnostic imaging of heart and coronary circulation: Secondary | ICD-10-CM

## 2016-09-02 DIAGNOSIS — J9601 Acute respiratory failure with hypoxia: Secondary | ICD-10-CM

## 2016-09-02 DIAGNOSIS — D473 Essential (hemorrhagic) thrombocythemia: Secondary | ICD-10-CM

## 2016-09-02 DIAGNOSIS — R748 Abnormal levels of other serum enzymes: Secondary | ICD-10-CM

## 2016-09-02 LAB — RENAL FUNCTION PANEL
ANION GAP: 12 (ref 5–15)
Albumin: 1.9 g/dL — ABNORMAL LOW (ref 3.5–5.0)
BUN: 26 mg/dL — ABNORMAL HIGH (ref 6–20)
CHLORIDE: 101 mmol/L (ref 101–111)
CO2: 23 mmol/L (ref 22–32)
Calcium: 7.9 mg/dL — ABNORMAL LOW (ref 8.9–10.3)
Creatinine, Ser: 1.89 mg/dL — ABNORMAL HIGH (ref 0.61–1.24)
GFR calc non Af Amer: 37 mL/min — ABNORMAL LOW (ref >60)
GFR, EST AFRICAN AMERICAN: 43 mL/min — AB (ref >60)
Glucose, Bld: 100 mg/dL — ABNORMAL HIGH (ref 65–99)
POTASSIUM: 3.6 mmol/L (ref 3.5–5.1)
Phosphorus: 4.5 mg/dL (ref 2.5–4.6)
Sodium: 136 mmol/L (ref 135–145)

## 2016-09-02 LAB — CBC
HCT: 27.1 % — ABNORMAL LOW (ref 39.0–52.0)
Hemoglobin: 8.5 g/dL — ABNORMAL LOW (ref 13.0–17.0)
MCH: 26.1 pg (ref 26.0–34.0)
MCHC: 31.4 g/dL (ref 30.0–36.0)
MCV: 83.1 fL (ref 78.0–100.0)
PLATELETS: 710 10*3/uL — AB (ref 150–400)
RBC: 3.26 MIL/uL — ABNORMAL LOW (ref 4.22–5.81)
RDW: 14.1 % (ref 11.5–15.5)
WBC: 19.8 10*3/uL — ABNORMAL HIGH (ref 4.0–10.5)

## 2016-09-02 LAB — PHOSPHORUS: Phosphorus: 5.1 mg/dL — ABNORMAL HIGH (ref 2.5–4.6)

## 2016-09-02 LAB — GLUCOSE, CAPILLARY
GLUCOSE-CAPILLARY: 97 mg/dL (ref 65–99)
GLUCOSE-CAPILLARY: 112 mg/dL — AB (ref 65–99)
Glucose-Capillary: 177 mg/dL — ABNORMAL HIGH (ref 65–99)
Glucose-Capillary: 106 mg/dL — ABNORMAL HIGH (ref 65–99)

## 2016-09-02 MED ORDER — LEVOTHYROXINE SODIUM 100 MCG PO TABS
100.0000 ug | ORAL_TABLET | Freq: Every day | ORAL | Status: DC
  Administered 2016-09-03 – 2016-09-04 (×2): 100 ug via ORAL
  Filled 2016-09-02 (×2): qty 1

## 2016-09-02 MED ORDER — ATORVASTATIN CALCIUM 40 MG PO TABS
40.0000 mg | ORAL_TABLET | Freq: Every day | ORAL | Status: DC
  Administered 2016-09-02 – 2016-09-03 (×2): 40 mg via ORAL
  Filled 2016-09-02 (×2): qty 1

## 2016-09-02 NOTE — NC FL2 (Signed)
[place] LEVEL OF CARE SCREENING TOOL     IDENTIFICATION  Patient Name: Todd [name] Birthdate: [birth year] Sex: male Admission Date (Current Location): 08/30/2016  [facility] and [place] Number:  Herbalist and Address:  [facility], [place] [address] [place], [place] [address]      Provider Number: [id number]  Attending Physician Name and Address:  Patrecia Pour, MD  Relative Name and Phone Number:       Current Level of Care: Hospital Recommended Level of Care: [place] Prior Approval Number:    Date Approved/Denied:   PASRR Number: [id number]  Discharge Plan: SNF    Current Diagnoses: Patient Active Problem List   Diagnosis Date Noted  . Acute on chronic kidney failure ([place]) 08/30/2016  . Pressure injury of skin 08/15/2016  . Gangrene associated with diabetes mellitus ([place]) 08/08/2016  . Diabetic peripheral neuropathy ([place]) 07/21/2016  . Renal failure 07/04/2016  . Gout 07/04/2016  . Elevated troponin 07/04/2016  . Cellulitis feet due to stasis dermatitis & edema 07/04/2016  . Hyperuricemia 06/21/2016  . HTN (hypertension) 01/05/2016  . Diabetes type 2, uncontrolled ([place]) 01/05/2016  . AKI (acute kidney injury) ([place]) 12/20/2015  . Hypothyroidism 05/10/2011    Orientation RESPIRATION BLADDER Height & Weight     Self, Place  Normal Incontinent Weight: 132 lb 6.4 oz (60.1 kg) Height:     BEHAVIORAL SYMPTOMS/MOOD NEUROLOGICAL BOWEL NUTRITION STATUS      Incontinent  (Please see d/c summary)  AMBULATORY STATUS COMMUNICATION OF NEEDS Skin   Extensive Assist Verbally Surgical wounds, PU Stage and Appropriate Care (Closed incision left leg, guaze dressing.)   PU Stage 2 Dressing:  (Right buttocks, foam dressing (PRN))                   Personal Care Assistance Level of Assistance  Bathing, Feeding, Dressing Bathing Assistance: Limited assistance Feeding assistance: Independent Dressing  Assistance: Limited assistance     Functional Limitations Info  Sight, Hearing, Speech Sight Info: Adequate Hearing Info: Adequate Speech Info: Adequate    SPECIAL CARE FACTORS FREQUENCY  PT (By licensed PT), OT (By licensed OT)     PT Frequency: 5x OT Frequency: 5x            Contractures Contractures Info: Not present    Additional Factors Info  Code Status, Allergies, Isolation Precautions Code Status Info: Full Code Allergies Info: Lisinopril     Isolation Precautions Info: Contact, MRSA     Current Medications (09/02/2016):  This is the current hospital active medication list Current Facility-Administered Medications  Medication Dose Route Frequency Provider Last Rate Last Dose  . aspirin tablet 325 mg  325 mg Oral Daily Omar Person, NP   325 mg at 09/02/16 5573  . atorvastatin (LIPITOR) tablet 40 mg  40 mg Oral q1800 Patrecia Pour, MD      . carvedilol (COREG) tablet 3.125 mg  3.125 mg Oral BID WC Rahul P Desai, PA-C   3.125 mg at 08/31/16 1603  . Chlorhexidine Gluconate Cloth 2 % PADS 6 each  6 each Topical Q0600 Chesley Mires, MD   6 each at 09/02/16 0507  . heparin injection 5,000 Units  5,000 Units Subcutaneous Q8H Omar Person, NP   5,000 Units at 09/02/16 1526  . hydrALAZINE (APRESOLINE) injection 10-40 mg  10-40 mg Intravenous Q4H PRN Rahul P Desai, PA-C      . insulin aspart (novoLOG) injection 0-5 Units  0-5 Units Subcutaneous QHS Collene Gobble, MD      . insulin aspart (novoLOG) injection 0-9 Units  0-9 Units Subcutaneous TID WC Collene Gobble, MD   2 Units at 09/02/16 [phone]  . [START ON 09/03/2016] levothyroxine (SYNTHROID, LEVOTHROID) tablet 100 mcg  100 mcg Oral QAC breakfast Patrecia Pour, MD      . mupirocin ointment (BACTROBAN) 2 % 1 application  1 application Nasal BID Chesley Mires, MD   1 application at 60/63/01 2117     Discharge Medications: Please see discharge summary for a list of discharge medications.  Relevant Imaging  Results:  Relevant Lab Results:   Additional Information SSN: [id number]  Eileen Stanford, LCSW

## 2016-09-02 NOTE — Consult Note (Signed)
Cardiology Consult    Patient ID: Todd [name] MRN: [id number], DOB/AGE: 62-Aug-1957   Admit date: 08/30/2016 Date of Consult: 09/02/2016  Primary Physician: [name], MD Primary Cardiologist: new - Dr. Stanford Breed Requesting Provider: Dr. Bonner Puna  Reason for Consult: new reduced EF  Patient Profile    Todd [name] is a 61 yo male with a PMH significant for HTN, DM, hypothyroidism, CKD stage III, and gout was admitted on 08/08/16 for gangrenous left foot and ulcers/cellulitis on right foot. Todd [name] underwent bilateral BKAs and discharged to SNF on 08/19/16. Todd [name] was re-admitted on 08/30/16 with acute on chronic kidney failure and confusion. Todd [name] received emergent CRRT. During this hospitalization, echocardiogram showed EF of 45% with new wall motion abnormality. Cardiology was consulted.   Past Medical History   Past Medical History:  Diagnosis Date  . Diabetes mellitus without complication ([place])   . Goiter   . Gout   . Hyperlipidemia   . Hypertension   . Thyroid disease     Past Surgical History:  Procedure Laterality Date  . ABDOMINAL AORTOGRAM N/A 08/11/2016   Procedure: Abdominal Aortogram;  Surgeon: Waynetta Sandy, MD;  Location: [facility];  Service: Cardiovascular;  Laterality: N/A;  . ABDOMINAL AORTOGRAM W/LOWER EXTREMITY N/A 08/15/2016   Procedure: Abdominal Aortogram w/Lower Extremity;  Surgeon: Elam Dutch, MD;  Location: [facility];  Service: Cardiovascular;  Laterality: N/A;  . AMPUTATION Left 08/12/2016   Procedure: LEFT BELOW KNEE AMPUTATION;  Surgeon: Newt Minion, MD;  Location: [facility];  Service: Orthopedics;  Laterality: Left;  . AMPUTATION Right 08/17/2016   Procedure: RIGHT BELOW KNEE AMPUTATION;  Surgeon: Elam Dutch, MD;  Location: [facility];  Service: Vascular;  Laterality: Right;  . LOWER EXTREMITY ANGIOGRAPHY Bilateral 08/11/2016   Procedure: Lower Extremity Angiography;  Surgeon: Waynetta Sandy, MD;  Location: [facility];   Service: Cardiovascular;  Laterality: Bilateral;  . PERIPHERAL VASCULAR BALLOON ANGIOPLASTY Left 08/11/2016   Procedure: Peripheral Vascular Balloon Angioplasty;  Surgeon: Waynetta Sandy, MD;  Location: [facility];  Service: Cardiovascular;  Laterality: Left;  SFA  . THYROID SURGERY       Allergies  Allergies  Allergen Reactions  . Lisinopril Other (See Comments)    Hyperkalemia / Renal failure    History of Present Illness    Todd [name] presented to [facility] from his PCP office for evaluation of right foot cellulitis and left foot gangrene. On this admission, dopplers revealed severe peripheral vascular disease. Todd [name] is being evaluated for possible left BKA. Vascular surgery was consulted and performed balloon angioplasty to left SFA and popliteal arteries. Todd [name] underwent left BKA on 08/12/16. On 08/13/16, Todd [name] was found to be septic, likely from his LE cellulitis and Todd [name] subsequently underwent right BKA on 08/17/16. Todd [name] was subsequently discharged on 08/19/16 to a SNF.  On 08/30/16, Todd [name] was brought back to [facility] with confusion, acute on chronic renal failure, and hyperkalemia.  Todd [name] underwent emergent CRRT. Todd [name] is currently less confused and will answer questions. Electrolytes now WNL, creatinine is improving. This acute kidney injury is thought to be secondary to hypotension, hypovolemia, and ACEI.  Lisinopril was discontinued, Todd [name] is currently on ASA and coreg. Troponins were mildly elevated and flat. Echocardiogram showed LVEF of 45% and hypokinesis of the basal-midanteroseptal myocardium. Cardiology was consulted for further management.  On my interview, Todd [name] denies every having chest pain, palpitations, shortness of breath, or syncope. Todd [name] does not see a cardiologist and states Todd [name]  has never been told Todd [name] has heart problems. Todd [name] is alert and oriented.   Inpatient Medications    . aspirin  325 mg Oral Daily  . carvedilol  3.125 mg Oral BID WC  . Chlorhexidine Gluconate Cloth  6 each Topical Q0600  .  heparin  5,000 Units Subcutaneous Q8H  . insulin aspart  0-5 Units Subcutaneous QHS  . insulin aspart  0-9 Units Subcutaneous TID WC  . levothyroxine  50 mcg Intravenous Daily  . mupirocin ointment  1 application Nasal BID     Outpatient Medications    Prior to Admission medications   Medication Sig Start Date End Date Taking? Authorizing Provider  acetaminophen (TYLENOL) 325 MG tablet Take 2 tablets (650 mg total) by mouth every 6 (six) hours as needed for mild pain (or Fever >/= 101). 08/19/16  Yes Shanker Kristeen Mans, MD  aspirin EC 325 MG EC tablet Take 1 tablet (325 mg total) by mouth daily. 08/20/16  Yes Shanker Kristeen Mans, MD  carvedilol (COREG) 3.125 MG tablet Take 1 tablet (3.125 mg total) by mouth 2 (two) times daily with a meal. 07/06/16  Yes Thurnell Lose, MD  COLCRYS 0.6 MG tablet Take 0.6 mg by mouth 2 (two) times daily. 07/29/16  Yes Historical Provider, MD  Insulin Glargine (LANTUS SOLOSTAR) 100 UNIT/ML Solostar Pen Inject 10 Units into the skin daily. 08/19/16  Yes Shanker Kristeen Mans, MD  levothyroxine (SYNTHROID, LEVOTHROID) 100 MCG tablet Take 1 tablet (100 mcg total) by mouth daily before breakfast. 07/07/16  Yes Thurnell Lose, MD  loperamide (IMODIUM) 2 MG capsule Take 4 mg by mouth as needed for diarrhea or loose stools.   Yes Historical Provider, MD  metFORMIN (GLUCOPHAGE) 500 MG tablet Take 1 tablet (500 mg total) by mouth daily with breakfast. 07/11/16  Yes [name], MD  oxyCODONE-acetaminophen (PERCOCET/ROXICET) 5-325 MG tablet Take 1-2 tablets by mouth every 4 (four) hours as needed for moderate pain. Patient taking differently: Take 1 tablet by mouth every 4 (four) hours as needed for moderate pain.  08/19/16  Yes Shanker Kristeen Mans, MD  polyethylene glycol (MIRALAX / GLYCOLAX) packet Take 17 g by mouth daily. 08/19/16  Yes Shanker Kristeen Mans, MD  pregabalin (LYRICA) 200 MG capsule Take 2 capsules (400 mg total) by mouth daily. Patient taking differently: Take 200 mg by  mouth 2 (two) times daily.  08/03/16  Yes [name], MD  feeding supplement, GLUCERNA SHAKE, (GLUCERNA SHAKE) LIQD Take 237 mLs by mouth 2 (two) times daily between meals. Patient not taking: Reported on 08/31/2016 08/19/16   Jonetta Osgood, MD  triamterene-hydrochlorothiazide (MAXZIDE-25) 37.5-25 MG tablet Take 1 tablet by mouth daily. For blood pressure and fluid Patient not taking: Reported on 08/31/2016 08/03/16   [name], MD     Family History     Family History  Problem Relation Age of Onset  . Heart disease Mother   . Pneumonia Father   . Diabetes Maternal Aunt   . Diabetes Maternal Uncle     Social History    Social History   Social History  . Marital status: Single    Spouse name: N/A  . Number of children: N/A  . Years of education: N/A   Occupational History  . drives a forklift    Social History Main Topics  . Smoking status: Current Every Day Smoker    Packs/day: 0.75    Years: 45.00    Types: Cigarettes    Last attempt to quit:  11/12/2014  . Smokeless tobacco: Never Used  . Alcohol use No     Comment: h/o heavy use; stopped drinking in 7/16  . Drug use: No  . Sexual activity: Not Currently   Other Topics Concern  . Not on file   Social History Narrative  . No narrative on file     Review of Systems    General:  No chills, fever, night sweats or weight changes.  Cardiovascular:  No chest pain, dyspnea on exertion, edema, orthopnea, palpitations, paroxysmal nocturnal dyspnea. Dermatological: No rash, lesions/masses Respiratory: No cough, dyspnea Urologic: No hematuria, dysuria Abdominal:   No nausea, vomiting, diarrhea, bright red blood per rectum, melena, or hematemesis Neurologic:  No visual changes, wkns, changes in mental status. All other systems reviewed and are otherwise negative except as noted above.  Physical Exam    Blood pressure 92/69, pulse 86, temperature 99.1 F (37.3 C), temperature source Oral, resp. rate 18, weight  132 lb 6.4 oz (60.1 kg), SpO2 100 %.  General: Pleasant, NAD Psych: Normal affect. Neuro: Alert and oriented X 3. Moves all extremities spontaneously. HEENT: Normal  Neck: Supple without bruits or JVD. Lungs:  Resp regular and unlabored, CTA. Heart: RRR no s3, s4, or murmurs. Abdomen: Soft, non-tender, non-distended, BS + x 4.  Extremities: No clubbing, cyanosis or edema. Radials 1+ and equal bilaterally, Bilateral BKA  Labs    Troponin (Point of Care Test) No results for input(s): TROPIPOC in the last 72 hours.  Recent Labs  08/30/16 2155 08/31/16 0131 08/31/16 0757  CKTOTAL  --  3,569*  --   TROPONINI 0.04* 0.03* 0.04*   Lab Results  Component Value Date   WBC 19.8 (H) 09/02/2016   HGB 8.5 (L) 09/02/2016   HCT 27.1 (L) 09/02/2016   MCV 83.1 09/02/2016   PLT 710 (H) 09/02/2016    Recent Labs Lab 08/30/16 2330  09/01/16 0355  NA  --   < > 136  K  --   < > 4.2  CL  --   < > 98*  CO2  --   < > 27  BUN  --   < > 26*  CREATININE  --   < > 2.14*  CALCIUM  --   < > 7.8*  PROT 8.3*  --   --   BILITOT 0.9  --   --   ALKPHOS 133*  --   --   ALT 60  --   --   AST 88*  --   --   GLUCOSE  --   < > 101*  < > = values in this interval not displayed. Lab Results  Component Value Date   CHOL 183 05/24/2016   HDL 41 05/24/2016   LDLCALC 103 (H) 05/24/2016   TRIG 195 (H) 05/24/2016   No results found for: [facility]   Radiology Studies    US Renal Port  Result Date: 08/31/2016 CLINICAL DATA:  Acute onset of renal insufficiency. Initial encounter. EXAM: RENAL / URINARY TRACT ULTRASOUND COMPLETE COMPARISON:  Renal ultrasound performed 07/04/2016 FINDINGS: Right Kidney: Length: 11.6 cm. Echogenicity within normal limits. No mass or hydronephrosis visualized. Left Kidney: Length: 11.8 cm. Echogenicity within normal limits. No mass or hydronephrosis visualized. Bladder: Decompressed, with a Foley catheter in place. IMPRESSION: Unremarkable renal ultrasound.  No evidence of  hydronephrosis. Electronically Signed   By: Garald Balding M.D.   On: 08/31/2016 03:13   US Arterial Seg Multiple  Result Date: 08/09/2016 CLINICAL DATA:  Worsening right foot cellulitis. Left lower extremity ischemia with multiple ulcers. EXAM: NONINVASIVE PHYSIOLOGIC VASCULAR STUDY OF BILATERAL LOWER EXTREMITIES TECHNIQUE: Evaluation of both lower extremities was performed at rest, including calculation of ankle-brachial indices, multiple segmental pressure evaluation, segmental Doppler and segmental pulse volume recording. COMPARISON:  None. FINDINGS: Right ABI:  0.29 Left ABI: 0.00. Doppler signal not detected in the left dorsalis pedis artery or left posterior tibial artery. Right Lower Extremity: Monophasic waveforms throughout the right lower extremity. Low amplitude PVR waveforms in the right lower extremity. Unable to detect signal in the right DP. Left Lower Extremity: There may be some biphasic and triphasic Doppler waveforms in the left femoral region. Monophasic waveforms in the left SFA. No significant signal in the left popliteal artery. No significant signal identified in the left posterior tibial artery or left dorsalis pedis artery. Abnormal PVR waveforms. Segmental pressures not obtained in the left upper leg. IMPRESSION: Left ABI could not be obtained. Left ankle arteries appear to be occluded. Severe occlusive arterial disease in the right lower extremity with ABI of 0.29. Electronically Signed   By: Markus Daft M.D.   On: 08/09/2016 16:59   Dg Chest Port 1 View  Result Date: 08/31/2016 CLINICAL DATA:  61 year old male with central line placement. EXAM: PORTABLE CHEST 1 VIEW COMPARISON:  Chest radiograph dated 08/30/2016 FINDINGS: There has been interval placement of a right IJ central line with tip over central SVC. There is no pneumothorax. Minimal bibasilar linear and platelike atelectatic changes noted. No focal consolidation, pleural effusion. The cardiac silhouette is within normal  limits. No acute osseous pathology. Multiple surgical clips at the base of the neck likely from prior thyroid surgery. IMPRESSION: Interval placement of a right IJ central line with tip over central SVC. No pneumothorax. Electronically Signed   By: Anner Crete M.D.   On: 08/31/2016 00:29   Dg Chest Portable 1 View  Result Date: 08/08/2016 CLINICAL DATA:  Left foot swelling starting last night, open blisters today EXAM: PORTABLE CHEST 1 VIEW COMPARISON:  07/03/2016 FINDINGS: Cardiomediastinal silhouette is stable. No infiltrate or pleural effusion. No pulmonary edema. Bony thorax is unremarkable. IMPRESSION: No active disease. Electronically Signed   By: Lahoma Crocker M.D.   On: 08/08/2016 17:06   Dg Foot 2 Views Left  Result Date: 08/08/2016 CLINICAL DATA:  Left foot swelling last night, open blisters today. History of diabetes, gout, hypertension. EXAM: LEFT FOOT - 2 VIEW COMPARISON:  Plain film of the left foot dated 07/03/2016. FINDINGS: Osseous alignment is stable. No acute or suspicious osseous finding. Again noted is a stable benign appearing chondroid lesion versus old bone infarct within the lower right tibia. Soft tissues about the right foot and ankle are unremarkable. IMPRESSION: No acute findings. Electronically Signed   By: Franki Cabot M.D.   On: 08/08/2016 17:10    ECG & Cardiac Imaging    Echocardiogram 09/01/16: Study Conclusions  - Left ventricle: The cavity size was normal. Systolic function was   mildly reduced. The estimated ejection fraction was in the range   of 45% to 50%. Hypokinesis of the basal-midinferior myocardium.   Hypokinesis of the basal-midanteroseptal myocardium.  Assessment & Plan    1. Abnormal echocardiogram / acute systolic heart failure - echo shows LVEF of 45% and wall motion abnormality. There are no studies found in EPIC for comparison. Todd [name] does not currently see a cardiologist. Todd [name] has multiple risk factors for CAD, including HTN, HLD, DM, tobacco  abuse, and family history of  heart disease.  Todd [name] will likely need further ischemic evaluation in the presence of reduced EF and wall motion abnormalities. Invasive intervention via heart catheterization is risky given his acute on chronic kidney injury during this hospitalization. ACEI/ARB will be held in the presence of acute on chronic kidney injury. EKG pending. CXR without cardiomegaly, effusion, or edema. Troponin 0.04 --> 0.03 --> 0.04; however, troponin was mildly elevated in Jan/2018 (0.05). Troponin is of unknown significance in the setting of a sCr > 5.  Will review EKG, but do not suspect an acute ischemic event at this time. Will see in follow up in the office after Todd [name] has recovered.   2. Acute on chronic kidney injury - no further CRRT - nephrology following - sCr improving since admision   3. HTN - difficult to assess given his volume status and recent CRRT - will avoid ACEI/ARB for now - continue coreg in the presence of systolic heart failure    4. Peripheral vascular disease - s/p bilateral BKA - vascular surgery following     Signed, Ledora Bottcher, PA-C 09/02/2016, 9:25 AM As above, patient seen and examined. Briefly Todd [name] is a 61 year old male with past medical history of diabetes mellitus, hypertension, tobacco abuse, peripheral vascular disease, status post recent bilateral BKA admitted with acute renal failure is now improving for evaluation of abnormal echocardiogram. Patient denies dyspnea, chest pain, palpitations or syncope. Troponin 0.04. Echo interpreted as ejection fraction 45-50% with wall motion abnormalities. Electrocardiogram February 26 shows diffuse T-wave inversion, prolonged QT interval.  1 abnormal echocardiogram-I have reviewed the patient's echocardiogram. I feel that his overall LV function is normal. There is a question of proximal septal hypokinesis. Todd [name] is not having any cardiac symptoms. Would not pursue further evaluation at this point given  recent acute renal failure and bilateral BKA. Todd [name] can follow-up with me in the office after Todd [name] recovers from his other medical issues and we can consider a stress test at that point.   2 peripheral vascular disease-continue aspirin. Would add Lipitor 40 mg daily given documented vascular disease. Check lipids and liver in 4 weeks.   3 acute on chronic kidney disease-per nephrology.   4 Mildly elevated troponin-patient is not having chest pain. Minimal elevation with no clear trend. Not consistent with acute coronary syndrome.   We will sign off. Please call with questions.      Kirk Ruths, MD

## 2016-09-02 NOTE — Progress Notes (Signed)
Subjective:   making good urine- over a liter- crt down- he says he feels good  Objective Vital signs in last 24 hours: Vitals:   09/01/16 1800 09/01/16 2038 09/02/16 0634 09/02/16 0953  BP: 105/66 (!) 89/56 92/69 101/72  Pulse: 76 82 86 90  Resp: 18 18 18 18   Temp:  99.3 F (37.4 C) 99.1 F (37.3 C) 98.7 F (37.1 C)  TempSrc:  Oral Oral Oral  SpO2: 98% 100% 100% 100%  Weight:  60.1 kg (132 lb 6.4 oz)     Weight change: 0.356 kg (12.6 oz)  Intake/Output Summary (Last 24 hours) at 09/02/16 1257 Last data filed at 09/02/16 0900  Gross per 24 hour  Intake              500 ml  Output              976 ml  Net             -476 ml    Assessment/ Plan: Pt is a 61 y.o. yo male who was admitted on 08/30/2016 with hypotension, lethargy found to be in AKI with hyperkalemia- was on lisinopril/triamterene/ HCTZ  Assessment/Plan: 1. Renal- AKI (crt 3/8 was 0.86)  Likely due to ATN from hypotension/ACE/volume depletion.  Started on CRRT for hyperkalemia 3/20 which has resolved- was on CRRT less than 24 hours.   making urine and creatinine down.  Do not think he will need more HD- can remove HD line. Could remove foley but pt wanted it left in for now  2. HTN/vol- initially hypotensive- now better with IVF only- did not require pressors.  WOULD NEVER PUT THIS MAN ON LISINOPRIL AGAIN !   Stop the hctz- OK to keep very low dose coreg 3. Anemia- dropped last 24 hours- supportive care 4. Hyperkalemia- resolved  Renal will sign off , call with questions    Todd [name] A    Labs: Basic Metabolic Panel:  Recent Labs Lab 08/31/16 1600 09/01/16 0355 09/02/16 0344 09/02/16 1049  NA 135 136  --  136  K 4.3 4.2  --  3.6  CL 99* 98*  --  101  CO2 28 27  --  23  GLUCOSE 96 101*  --  100*  BUN 23* 26*  --  26*  CREATININE 1.58* 2.14*  --  1.89*  CALCIUM 8.1* 7.8*  --  7.9*  PHOS 2.9 3.5 5.1* 4.5   Liver Function Tests:  Recent Labs Lab 08/30/16 2330  08/31/16 1600  09/01/16 0355 09/02/16 1049  AST 88*  --   --   --   --   ALT 60  --   --   --   --   ALKPHOS 133*  --   --   --   --   BILITOT 0.9  --   --   --   --   PROT 8.3*  --   --   --   --   ALBUMIN 2.1*  < > 2.1* 1.9* 1.9*  < > = values in this interval not displayed. No results for input(s): LIPASE, AMYLASE in the last 168 hours. No results for input(s): AMMONIA in the last 168 hours. CBC:  Recent Labs Lab 08/30/16 2155 08/31/16 0131 09/01/16 0355 09/02/16 0344  WBC 20.5* 19.5* 22.7* 19.8*  HGB 9.2* 10.8* 8.4* 8.5*  HCT 28.5* 32.7* 26.3* 27.1*  MCV 82.8 82.6 82.7 83.1  PLT 1,009* 651* 766* 710*   Cardiac Enzymes:  Recent Labs  Lab 08/30/16 2155 08/31/16 0131 08/31/16 0757  CKTOTAL  --  3,569*  --   TROPONINI 0.04* 0.03* 0.04*   CBG:  Recent Labs Lab 09/01/16 1140 09/01/16 1552 09/01/16 2036 09/02/16 0811 09/02/16 1159  GLUCAP 97 94 120* 177* 97    Iron Studies:   Recent Labs  08/31/16 1134  IRON 31*  TIBC 237*   Studies/Results: No results found. Medications: Infusions:   Scheduled Medications: . aspirin  325 mg Oral Daily  . carvedilol  3.125 mg Oral BID WC  . Chlorhexidine Gluconate Cloth  6 each Topical Q0600  . heparin  5,000 Units Subcutaneous Q8H  . insulin aspart  0-5 Units Subcutaneous QHS  . insulin aspart  0-9 Units Subcutaneous TID WC  . [START ON 09/03/2016] levothyroxine  100 mcg Oral QAC breakfast  . mupirocin ointment  1 application Nasal BID    have reviewed scheduled and prn medications.  Physical Exam: General: more alert initially but then drifts off Heart: RRR Lungs: mostly clear Abdomen: soft, non tender Extremities: no edema Dialysis Access: right IJ vascath placed 3/20    09/02/2016,12:57 PM  LOS: 3 days

## 2016-09-02 NOTE — Progress Notes (Signed)
Sister [name] called this RN- updated her on pt's status and labs.   Paulla Fore, RN

## 2016-09-02 NOTE — Progress Notes (Signed)
PROGRESS NOTE  Todd [name]  ZOX:[id number] DOB: 02-Sep-1955 DOA: 08/30/2016 PCP: [name], MD   Brief Narrative: Todd [name] is a 61 y.o. male brought from SNF for AMS found to be hypotensive and hyperkalemic with acute renal failure. Subsequently admitted to ICU for CRRT, never required pressors. Renal failure thought to be due to hypotension in addition to lisinopril, triamterene, and HCTZ which have been stopped. CRRT completed 3/21 with sustained improvement in renal function and urine output. With improvement, he was transferred to the medical floor 3/22 onto hospitalist service 3/23. Nephrology signed off 3/23, recommending discontinuation of HD line. Echocardiogram showed mildly reduced EF with focal basal/mid-inferior myocardium hypokinesis. The patient had modest troponin elevation earlier in the admission in the setting of renal failure and denies ever having had chest pain. ECG is pending and cardiology was consulted. They've recommended outpatient follow up with consideration of stress test, wishing to avoid contrast for catheterization.    Assessment & Plan: Active Problems:   AKI (acute kidney injury) ([place])   Acute on chronic kidney failure (HCC)  Acute renal failure: Pre-renal and ATN due to hypotension with nephrotoxic meds (lisinopril, triamterene, HCTZ), required CRRT 3/20 - 3/21 due to hyperkalemia.  - Nephrology recommends pulling HD line as creatinine and urine output continue to improve while holding offending medications.  - Permanently discontinue ACE inhibitors (on allergy list).  - Discontinue foley and HD cath.  - Avoid nephrotoxins  - Bicarb gtt stopped  Acute encephalopathy: Uremic, resolving.  - Monitor  Peripheral vascular disease: s/p bilateral BKA (3/2 and 3/5).  - Continue ASA, start lipitor 40mg  (check LFTs and FLP in 4-6 weeks) - Has follow up with Dr. Sharol Given 3/29 @12 :45pm - Air mattress - PT/OT  HTN and left ventricle systolic dysfunction:  Echocardiogram 3/23 read as EF 45-50% with basal midinferior myocardial hypokinesis without evidence of ACS. - Follow up with cardiology as outpatient for consideration of ischemic work up.  - Will not start ACE/ARB as cardiology consultant re-reading echocardiogram believe LV fxn essentially normal and risk of renal failure is so significant.  - Continue coreg - Holding HCTZ, triamterene - Heart healthy diet  Thrombocytosis/leukocytosis: Reactive to acute illness, not primary.  - Monitor   Hypothyroidism: Chronic, stable. TSH 1.726 in Jan 2018.  - Continue home synthroid  DVT prophylaxis: Heparin Code Status: Full Family Communication: None at bedside this AM Disposition Plan: SNF once accepted.   Consultants:   [facility], Dr. Moshe Cipro  Cardiology, Dr. Stanford Breed  Procedures:  Echocardiogram 3/22:  Left ventricle: The cavity size was normal. Systolic function was   mildly reduced. The estimated ejection fraction was in the range   of 45% to 50%. Hypokinesis of the basal-midinferior myocardium.   Hypokinesis of the basal-midanteroseptal myocardium.  Antimicrobials:  None  Subjective: Pt without complaints. No chest pain, dyspnea, leg swelling. Eating well.   Objective: Vitals:   09/01/16 1800 09/01/16 2038 09/02/16 0634 09/02/16 0953  BP: 105/66 (!) 89/56 92/69 101/72  Pulse: 76 82 86 90  Resp: 18 18 18 18   Temp:  99.3 F (37.4 C) 99.1 F (37.3 C) 98.7 F (37.1 C)  TempSrc:  Oral Oral Oral  SpO2: 98% 100% 100% 100%  Weight:  60.1 kg (132 lb 6.4 oz)      Intake/Output Summary (Last 24 hours) at 09/02/16 1430 Last data filed at 09/02/16 0900  Gross per 24 hour  Intake              480 ml  Output              976 ml  Net             -496 ml   Filed Weights   08/31/16 0443 09/01/16 0345 09/01/16 2038  Weight: 60 kg (132 lb 4.4 oz) 59.7 kg (131 lb 9.8 oz) 60.1 kg (132 lb 6.4 oz)    Examination: General exam: Pleasant 61yo M in no  distress Respiratory system: Non-labored breathing room air. Clear to auscultation bilaterally.  Cardiovascular system: Regular rate and rhythm. No murmur, rub, or gallop. No JVD. Gastrointestinal system: Abdomen soft, non-tender, non-distended, with normoactive bowel sounds. No organomegaly or masses felt. Central nervous system: Alert and oriented. No focal neurological deficits. Extremities: Bilateral BKA with stump c/d/i Skin: No rashes, lesions other than BKA stumps.  Psychiatry: Judgement and insight appear normal. Mood & affect appropriate.   Data Reviewed: I have personally reviewed following labs and imaging studies  CBC:  Recent Labs Lab 08/30/16 2155 08/31/16 0131 09/01/16 0355 09/02/16 0344  WBC 20.5* 19.5* 22.7* 19.8*  HGB 9.2* 10.8* 8.4* 8.5*  HCT 28.5* 32.7* 26.3* 27.1*  MCV 82.8 82.6 82.7 83.1  PLT 1,009* 651* 766* 161*   Basic Metabolic Panel:  Recent Labs Lab 08/30/16 2155 08/31/16 0131 08/31/16 0447 08/31/16 1600 09/01/16 0355 09/02/16 0344 09/02/16 1049  NA 135 135 136 135 136  --  136  K 6.4* 6.1* 3.8 4.3 4.2  --  3.6  CL 103 102 99* 99* 98*  --  101  CO2 15* 17* 25 28 27   --  23  GLUCOSE 87 86 109* 96 101*  --  100*  BUN 110* 101* 65* 23* 26*  --  26*  CREATININE 5.68* 4.99* 3.20* 1.58* 2.14*  --  1.89*  CALCIUM 8.7* 8.6* 8.3* 8.1* 7.8*  --  7.9*  MG 2.4 2.4  --   --  2.1  --   --   PHOS 8.2* 7.4* 4.6 2.9 3.5 5.1* 4.5   GFR: Estimated Creatinine Clearance: 34.9 mL/min (A) (by C-G formula based on SCr of 1.89 mg/dL (H)). Liver Function Tests:  Recent Labs Lab 08/30/16 2330 08/31/16 0131 08/31/16 0447 08/31/16 1600 09/01/16 0355 09/02/16 1049  AST 88*  --   --   --   --   --   ALT 60  --   --   --   --   --   ALKPHOS 133*  --   --   --   --   --   BILITOT 0.9  --   --   --   --   --   PROT 8.3*  --   --   --   --   --   ALBUMIN 2.1* 2.3* 2.0* 2.1* 1.9* 1.9*   No results for input(s): LIPASE, AMYLASE in the last 168 hours. No  results for input(s): AMMONIA in the last 168 hours. Coagulation Profile:  Recent Labs Lab 08/30/16 2155  INR 1.22   Cardiac Enzymes:  Recent Labs Lab 08/30/16 2155 08/31/16 0131 08/31/16 0757  CKTOTAL  --  3,569*  --   TROPONINI 0.04* 0.03* 0.04*   BNP (last 3 results) No results for input(s): PROBNP in the last 8760 hours. HbA1C: No results for input(s): HGBA1C in the last 72 hours. CBG:  Recent Labs Lab 09/01/16 1140 09/01/16 1552 09/01/16 2036 09/02/16 0811 09/02/16 1159  GLUCAP 97 94 120* 177* 97   Lipid Profile: No results for  input(s): CHOL, HDL, LDLCALC, TRIG, CHOLHDL, LDLDIRECT in the last 72 hours. Thyroid Function Tests: No results for input(s): TSH, T4TOTAL, FREET4, T3FREE, THYROIDAB in the last 72 hours. Anemia Panel:  Recent Labs  08/31/16 1134  TIBC 237*  IRON 31*   Urine analysis:    Component Value Date/Time   COLORURINE YELLOW 08/30/2016 2310   APPEARANCEUR HAZY (A) 08/30/2016 2310   APPEARANCEUR Clear 08/05/2016 1630   LABSPEC 1.018 08/30/2016 2310   PHURINE 5.0 08/30/2016 2310   GLUCOSEU NEGATIVE 08/30/2016 2310   HGBUR MODERATE (A) 08/30/2016 2310   BILIRUBINUR NEGATIVE 08/30/2016 2310   BILIRUBINUR Negative 08/05/2016 1630   KETONESUR NEGATIVE 08/30/2016 2310   PROTEINUR NEGATIVE 08/30/2016 2310   NITRITE NEGATIVE 08/30/2016 2310   LEUKOCYTESUR NEGATIVE 08/30/2016 2310   LEUKOCYTESUR Negative 08/05/2016 1630   Recent Results (from the past 240 hour(s))  MRSA PCR Screening     Status: Abnormal   Collection Time: 08/30/16  9:49 PM  Result Value Ref Range Status   MRSA by PCR POSITIVE (A) NEGATIVE Final    Comment:        The GeneXpert MRSA Assay (FDA approved for NASAL specimens only), is one component of a comprehensive MRSA colonization surveillance program. It is not intended to diagnose MRSA infection nor to guide or monitor treatment for MRSA infections. RESULT CALLED TO, READ BACK BY AND VERIFIED WITH: L WALSH,RN  @0041  08/31/16 MKELLY,MLT   Culture, blood (routine x 2)     Status: None (Preliminary result)   Collection Time: 08/30/16  9:55 PM  Result Value Ref Range Status   Specimen Description BLOOD RIGHT ANTECUBITAL  Final   Special Requests BOTTLES DRAWN AEROBIC AND ANAEROBIC 5CC EACH  Final   Culture NO GROWTH 3 DAYS  Final   Report Status PENDING  Incomplete  Culture, blood (routine x 2)     Status: None (Preliminary result)   Collection Time: 08/30/16  9:55 PM  Result Value Ref Range Status   Specimen Description BLOOD LEFT HAND  Final   Special Requests BOTTLES DRAWN AEROBIC AND ANAEROBIC 5CC EACH  Final   Culture NO GROWTH 3 DAYS  Final   Report Status PENDING  Incomplete      Radiology Studies: No results found.  Scheduled Meds: . aspirin  325 mg Oral Daily  . carvedilol  3.125 mg Oral BID WC  . Chlorhexidine Gluconate Cloth  6 each Topical Q0600  . heparin  5,000 Units Subcutaneous Q8H  . insulin aspart  0-5 Units Subcutaneous QHS  . insulin aspart  0-9 Units Subcutaneous TID WC  . [START ON 09/03/2016] levothyroxine  100 mcg Oral QAC breakfast  . mupirocin ointment  1 application Nasal BID   Continuous Infusions:   LOS: 3 days   Time spent: 25 minutes.  Vance Gather, MD [facility] Pager [phone]  If 7PM-7AM, please contact night-coverage [email or web address] Password [email or web address] 09/02/2016, 2:30 PM

## 2016-09-03 ENCOUNTER — Encounter (HOSPITAL_COMMUNITY): Payer: Self-pay | Admitting: *Deleted

## 2016-09-03 DIAGNOSIS — R338 Other retention of urine: Secondary | ICD-10-CM

## 2016-09-03 DIAGNOSIS — I519 Heart disease, unspecified: Secondary | ICD-10-CM

## 2016-09-03 DIAGNOSIS — I739 Peripheral vascular disease, unspecified: Secondary | ICD-10-CM

## 2016-09-03 LAB — RENAL FUNCTION PANEL
ANION GAP: 12 (ref 5–15)
Albumin: 2.2 g/dL — ABNORMAL LOW (ref 3.5–5.0)
BUN: 23 mg/dL — ABNORMAL HIGH (ref 6–20)
CALCIUM: 8.1 mg/dL — AB (ref 8.9–10.3)
CO2: 21 mmol/L — AB (ref 22–32)
Chloride: 101 mmol/L (ref 101–111)
Creatinine, Ser: 1.55 mg/dL — ABNORMAL HIGH (ref 0.61–1.24)
GFR calc Af Amer: 54 mL/min — ABNORMAL LOW (ref >60)
GFR calc non Af Amer: 47 mL/min — ABNORMAL LOW (ref >60)
GLUCOSE: 101 mg/dL — AB (ref 65–99)
Phosphorus: 5.4 mg/dL — ABNORMAL HIGH (ref 2.5–4.6)
Potassium: 3.7 mmol/L (ref 3.5–5.1)
SODIUM: 134 mmol/L — AB (ref 135–145)

## 2016-09-03 LAB — GLUCOSE, CAPILLARY
Glucose-Capillary: 158 mg/dL — ABNORMAL HIGH (ref 65–99)
Glucose-Capillary: 149 mg/dL — ABNORMAL HIGH (ref 65–99)
Glucose-Capillary: 119 mg/dL — ABNORMAL HIGH (ref 65–99)
Glucose-Capillary: 117 mg/dL — ABNORMAL HIGH (ref 65–99)

## 2016-09-03 LAB — CBC
HEMATOCRIT: 27 % — AB (ref 39.0–52.0)
HEMOGLOBIN: 8.5 g/dL — AB (ref 13.0–17.0)
MCH: 25.9 pg — AB (ref 26.0–34.0)
MCHC: 31.5 g/dL (ref 30.0–36.0)
MCV: 82.3 fL (ref 78.0–100.0)
Platelets: 668 10*3/uL — ABNORMAL HIGH (ref 150–400)
RBC: 3.28 MIL/uL — ABNORMAL LOW (ref 4.22–5.81)
RDW: 13.9 % (ref 11.5–15.5)
WBC: 22 10*3/uL — ABNORMAL HIGH (ref 4.0–10.5)

## 2016-09-03 NOTE — Progress Notes (Signed)
Discontinued foley catheter. Pt tolerated well.

## 2016-09-03 NOTE — Progress Notes (Signed)
PROGRESS NOTE  Todd [name]  NWG:[id number] DOB: September 17, 1955 DOA: 08/30/2016 PCP: [name], MD   Brief Narrative: Todd [name] is a 61 y.o. male brought from SNF for AMS found to be hypotensive and hyperkalemic with acute renal failure. Subsequently admitted to ICU for CRRT, never required pressors. Renal failure thought to be due to hypotension in addition to lisinopril, triamterene, and HCTZ which have been stopped. CRRT completed 3/21 with sustained improvement in renal function and urine output. With improvement, he was transferred to the medical floor 3/22 onto hospitalist service 3/23. Nephrology signed off 3/23, recommending discontinuation of HD line. Echocardiogram showed mildly reduced EF with focal basal/mid-inferior myocardium hypokinesis. The patient had modest troponin elevation earlier in the admission in the setting of renal failure and denies ever having had chest pain. ECG is pending and cardiology was consulted. They've recommended outpatient follow up with consideration of stress test, wishing to avoid contrast for catheterization.   Assessment & Plan: Active Problems:   AKI (acute kidney injury) ([place])   Acute on chronic kidney failure (HCC)  Acute renal failure: Pre-renal and ATN due to hypotension with nephrotoxic meds (lisinopril, triamterene, HCTZ), required CRRT 3/20 - 3/21 due to hyperkalemia.  - HD cath pulled, creatinine continues to improve.  - Permanently discontinue ACE inhibitors (on allergy list).  - Discontinue foley today.  - Avoid nephrotoxins   Acute encephalopathy: Uremic, resolving.  - Monitor  Peripheral vascular disease: s/p bilateral BKA (3/2 and 3/5).  - Continue ASA, start lipitor 40mg  (check LFTs and FLP in 4-6 weeks) - Has follow up with Dr. Sharol Given 3/29 @12 :45pm - Air mattress - PT/OT reevaluation required prior to discharge 3/25.   HTN and left ventricle systolic dysfunction: Echocardiogram 3/23 read as EF 45-50% with basal  midinferior myocardial hypokinesis without evidence of ACS. - Follow up with cardiology as outpatient for consideration of ischemic work up.  - Will not start ACE/ARB as cardiology consultant re-reading echocardiogram believe LV fxn essentially normal and risk of renal failure is so significant.  - Continue coreg - Holding HCTZ, triamterene now and at discharge as BP is low-normal.  - Heart healthy diet  hrombocytosis/leukocytosis: Reactive to acute illness, not primary.  - Monitor - Blood cultures from 3/20 are NGTD at 4 days.   Hypothyroidism: Chronic, stable. TSH 1.726 in Jan 2018.  - Continue home synthroid  DVT prophylaxis: Heparin Code Status: Full Family Communication: None at bedside this AM Disposition Plan: SNF once accepted. Stable for discharge.    Consultants:   [facility]  [facility]phrology, Dr. Moshe Cipro  Cardiology, Dr. Stanford Breed  Procedures:  Echocardiogram 3/22:  Left ventricle: The cavity size was normal. Systolic function was   mildly reduced. The estimated ejection fraction was in the range   of 45% to 50%. Hypokinesis of the basal-midinferior myocardium.   Hypokinesis of the basal-midanteroseptal myocardium.  Antimicrobials:  None  Subjective: Pt without complaints. No chest pain, dyspnea, leg swelling. Eating well. Hasn't work with PT yet.   Objective: Vitals:   09/03/16 0142 09/03/16 0300 09/03/16 0421 09/03/16 0910  BP:   103/68 99/63  Pulse:   83 85  Resp:   18 16  Temp:   99.5 F (37.5 C) 99.2 F (37.3 C)  TempSrc:   Oral Oral  SpO2:   100% 100%  Weight: 62.1 kg (136 lb 14.5 oz)     Height:  5\' 8"  (1.727 m)      Intake/Output Summary (Last 24 hours) at 09/03/16 1547 Last data filed at 09/03/16  1400  Gross per 24 hour  Intake              240 ml  Output              825 ml  Net             -585 ml   Filed Weights   09/01/16 2038 09/02/16 2232 09/03/16 0142  Weight: 60.1 kg (132 lb 6.4 oz) 62.1 kg (137 lb) 62.1 kg (136 lb 14.5 oz)     Examination: General exam: Pleasant 61yo M in no distress Respiratory system: Non-labored breathing room air. Clear to auscultation bilaterally.  Cardiovascular system: Regular rate and rhythm. No murmur, rub, or gallop. No JVD. Gastrointestinal system: Abdomen soft, non-tender, non-distended, with normoactive bowel sounds. No organomegaly or masses felt. Central nervous system: Alert and oriented. No focal neurological deficits. Diffusely weak. Extremities: Bilateral BKA with stump c/d/i Skin: No rashes, lesions other than BKA stumps.  Psychiatry: Judgement and insight appear normal. Mood & affect appropriate.   Data Reviewed: I have personally reviewed following labs and imaging studies  CBC:  Recent Labs Lab 08/30/16 2155 08/31/16 0131 09/01/16 0355 09/02/16 0344 09/03/16 0627  WBC 20.5* 19.5* 22.7* 19.8* 22.0*  HGB 9.2* 10.8* 8.4* 8.5* 8.5*  HCT 28.5* 32.7* 26.3* 27.1* 27.0*  MCV 82.8 82.6 82.7 83.1 82.3  PLT 1,009* 651* 766* 710* 967*   Basic Metabolic Panel:  Recent Labs Lab 08/30/16 2155 08/31/16 0131 08/31/16 0447 08/31/16 1600 09/01/16 0355 09/02/16 0344 09/02/16 1049 09/03/16 0622  NA 135 135 136 135 136  --  136 134*  K 6.4* 6.1* 3.8 4.3 4.2  --  3.6 3.7  CL 103 102 99* 99* 98*  --  101 101  CO2 15* 17* 25 28 27   --  23 21*  GLUCOSE 87 86 109* 96 101*  --  100* 101*  BUN 110* 101* 65* 23* 26*  --  26* 23*  CREATININE 5.68* 4.99* 3.20* 1.58* 2.14*  --  1.89* 1.55*  CALCIUM 8.7* 8.6* 8.3* 8.1* 7.8*  --  7.9* 8.1*  MG 2.4 2.4  --   --  2.1  --   --   --   PHOS 8.2* 7.4* 4.6 2.9 3.5 5.1* 4.5 5.4*   GFR: Estimated Creatinine Clearance: 44 mL/min (A) (by C-G formula based on SCr of 1.55 mg/dL (H)). Liver Function Tests:  Recent Labs Lab 08/30/16 2330  08/31/16 0447 08/31/16 1600 09/01/16 0355 09/02/16 1049 09/03/16 0622  AST 88*  --   --   --   --   --   --   ALT 60  --   --   --   --   --   --   ALKPHOS 133*  --   --   --   --   --   --    BILITOT 0.9  --   --   --   --   --   --   PROT 8.3*  --   --   --   --   --   --   ALBUMIN 2.1*  < > 2.0* 2.1* 1.9* 1.9* 2.2*  < > = values in this interval not displayed. No results for input(s): LIPASE, AMYLASE in the last 168 hours. No results for input(s): AMMONIA in the last 168 hours. Coagulation Profile:  Recent Labs Lab 08/30/16 2155  INR 1.22   Cardiac Enzymes:  Recent Labs Lab 08/30/16 2155 08/31/16 0131  08/31/16 0757  CKTOTAL  --  3,569*  --   TROPONINI 0.04* 0.03* 0.04*   BNP (last 3 results) No results for input(s): PROBNP in the last 8760 hours. HbA1C: No results for input(s): HGBA1C in the last 72 hours. CBG:  Recent Labs Lab 09/02/16 1159 09/02/16 1618 09/02/16 2307 09/03/16 0745 09/03/16 1145  GLUCAP 97 106* 112* 158* 149*   Lipid Profile: No results for input(s): CHOL, HDL, LDLCALC, TRIG, CHOLHDL, LDLDIRECT in the last 72 hours. Thyroid Function Tests: No results for input(s): TSH, T4TOTAL, FREET4, T3FREE, THYROIDAB in the last 72 hours. Anemia Panel: No results for input(s): VITAMINB12, FOLATE, FERRITIN, TIBC, IRON, RETICCTPCT in the last 72 hours. Urine analysis:    Component Value Date/Time   COLORURINE YELLOW 08/30/2016 2310   APPEARANCEUR HAZY (A) 08/30/2016 2310   APPEARANCEUR Clear 08/05/2016 1630   LABSPEC 1.018 08/30/2016 2310   PHURINE 5.0 08/30/2016 2310   GLUCOSEU NEGATIVE 08/30/2016 2310   HGBUR MODERATE (A) 08/30/2016 2310   BILIRUBINUR NEGATIVE 08/30/2016 2310   BILIRUBINUR Negative 08/05/2016 [place] 08/30/2016 2310   PROTEINUR NEGATIVE 08/30/2016 2310   NITRITE NEGATIVE 08/30/2016 2310   LEUKOCYTESUR NEGATIVE 08/30/2016 2310   LEUKOCYTESUR Negative 08/05/2016 1630   Recent Results (from the past 240 hour(s))  MRSA PCR Screening     Status: Abnormal   Collection Time: 08/30/16  9:49 PM  Result Value Ref Range Status   MRSA by PCR POSITIVE (A) NEGATIVE Final    Comment:        The GeneXpert MRSA  Assay (FDA approved for NASAL specimens only), is one component of a comprehensive MRSA colonization surveillance program. It is not intended to diagnose MRSA infection nor to guide or monitor treatment for MRSA infections. RESULT CALLED TO, READ BACK BY AND VERIFIED WITH: L WALSH,RN @0041  08/31/16 MKELLY,MLT   Culture, blood (routine x 2)     Status: None (Preliminary result)   Collection Time: 08/30/16  9:55 PM  Result Value Ref Range Status   Specimen Description BLOOD RIGHT ANTECUBITAL  Final   Special Requests BOTTLES DRAWN AEROBIC AND ANAEROBIC 5CC EACH  Final   Culture NO GROWTH 4 DAYS  Final   Report Status PENDING  Incomplete  Culture, blood (routine x 2)     Status: None (Preliminary result)   Collection Time: 08/30/16  9:55 PM  Result Value Ref Range Status   Specimen Description BLOOD LEFT HAND  Final   Special Requests BOTTLES DRAWN AEROBIC AND ANAEROBIC 5CC EACH  Final   Culture NO GROWTH 4 DAYS  Final   Report Status PENDING  Incomplete      Radiology Studies: No results found.  Scheduled Meds: . aspirin  325 mg Oral Daily  . atorvastatin  40 mg Oral q1800  . carvedilol  3.125 mg Oral BID WC  . Chlorhexidine Gluconate Cloth  6 each Topical Q0600  . heparin  5,000 Units Subcutaneous Q8H  . insulin aspart  0-5 Units Subcutaneous QHS  . insulin aspart  0-9 Units Subcutaneous TID WC  . levothyroxine  100 mcg Oral QAC breakfast  . mupirocin ointment  1 application Nasal BID   Continuous Infusions:   LOS: 4 days   Time spent: 25 minutes.  Vance Gather, MD [facility] Pager [phone]  If 7PM-7AM, please contact night-coverage [email or web address] Password [facility] 09/03/2016, 3:47 PM

## 2016-09-03 NOTE — Evaluation (Signed)
Physical Therapy Evaluation Patient Details Name: Todd [name] MRN: [id number] DOB: 03-23-1956 Today's Date: 09/03/2016   History of Present Illness  61 year old male with PMH of DM with peripheral neuropathy, PAD, HTN, with recent bilateral BKA (Left 3/2 and Right 3/5) secondary to ischemia with gangrenous changes. Presents to ED on 3/20 from SNF with reported 24 hours of hypotension, AMS, AKI and hyperkalemia.  Clinical Impression  Pt admitted with/for AMS and complications above.  Pt is at a total assist level presently.  Pt currently limited functionally due to the problems listed. ( See problems list.)   Pt will benefit from PT to maximize function and safety in order to get ready for next venue listed below.      Follow Up Recommendations SNF;Supervision/Assistance - 24 hour    Equipment Recommendations  Wheelchair (measurements PT);Wheelchair cushion (measurements PT)    Recommendations for Other Services       Precautions / Restrictions Precautions Precautions: Fall      Mobility  Bed Mobility Overal bed mobility: Needs Assistance Bed Mobility: Rolling;Supine to Sit Rolling: Mod assist   Supine to sit: Max assist     General bed mobility comments: pt unable to follow direction or focus well to assist   Transfers Overall transfer level: Needs assistance   Transfers: Anterior-Posterior Transfer       Anterior-Posterior transfers: Total assist (posterior transfer into chair with assist of pad)   General transfer comment: pt needed both stability assist in sitting and majority of the slide into the chair due to pt unable to get his arms into a functional position by himself to push.  Ambulation/Gait                Stairs            Wheelchair Mobility    Modified Rankin (Stroke Patients Only)       Balance Overall balance assessment: Needs assistance Sitting-balance support: Bilateral upper extremity supported Sitting balance-Leahy  Scale: Zero Sitting balance - Comments: pt unable to keep himself from falling posteriorly even with bil UE's slightly behind him for support.                                     Pertinent Vitals/Pain Pain Assessment: Faces Faces Pain Scale: Hurts even more Pain Location: RLE Pain Descriptors / Indicators: Sore;Grimacing;Guarding Pain Intervention(s): Limited activity within patient's tolerance;Monitored during session;Repositioned    Home Living Family/patient expects to be discharged to:: Skilled nursing facility                 Additional Comments: appears by his knee contractures R much worse than L LE that therapy may not have progressed much.    Prior Function Level of Independence:  (independent with AD prior to bil BKA's)               Hand Dominance   Dominant Hand: Right    Extremity/Trunk Assessment   Upper Extremity Assessment Upper Extremity Assessment: Generalized weakness;Difficult to assess due to impaired cognition    Lower Extremity Assessment Lower Extremity Assessment: Difficult to assess due to impaired cognition;RLE deficits/detail;LLE deficits/detail RLE Deficits / Details: Knee has flexion contracture, about -50* ext lag; generally weak at >3/5 RLE: Unable to fully assess due to pain RLE Coordination: decreased fine motor LLE Deficits / Details: approx -10* knee ext. LLE Coordination: decreased fine motor  Communication   Communication: No difficulties  Cognition Arousal/Alertness: Awake/alert Behavior During Therapy: Flat affect Overall Cognitive Status: Impaired/Different from baseline Area of Impairment: Attention;Memory;Following commands;Safety/judgement;Awareness;Problem solving;Orientation                 Orientation Level: Place;Time;Situation Current Attention Level: Focused Memory: Decreased short-term memory Following Commands: Follows one step commands inconsistently Safety/Judgement:  Decreased awareness of safety;Decreased awareness of deficits Awareness: Intellectual Problem Solving: Slow processing        General Comments      Exercises Other Exercises Other Exercises: spent about 6-8 min on both knees, R more than L trying to extend knees straight. with prolonged stretch techniques.   Assessment/Plan    PT Assessment Patient needs continued PT services  PT Problem List Decreased strength;Decreased activity tolerance;Decreased balance;Decreased mobility;Decreased coordination;Pain       PT Treatment Interventions Functional mobility training;Therapeutic activities;Therapeutic exercise;Balance training;Patient/family education    PT Goals (Current goals can be found in the Care Plan section)  Acute Rehab PT Goals Patient Stated Goal: be able to walk PT Goal Formulation: With patient Time For Goal Achievement: 09/17/16 Potential to Achieve Goals: Fair    Frequency Min 3X/week   Barriers to discharge Decreased caregiver support      Co-evaluation               End of Session   Activity Tolerance: Patient limited by fatigue Patient left: in chair;with call bell/phone within reach;with chair alarm set Nurse Communication: Mobility status PT Visit Diagnosis: Muscle weakness (generalized) (M62.81);Other (comment);Pain (BKA's with mild to severe contracture) Pain - Right/Left: Right Pain - part of body: Leg    Time: 1450-1525 PT Time Calculation (min) (ACUTE ONLY): 35 min   Charges:   PT Evaluation $PT Eval Moderate Complexity: 1 Procedure PT Treatments $Therapeutic Activity: 8-22 mins   PT G Codes:        September 26, 2016  Donnella Sham, PT [phone]  (pager)  Tessie Fass Bianka Liberati 2016/09/26, 3:59 PM

## 2016-09-03 NOTE — Discharge Summary (Signed)
Physician Discharge Summary  Todd [name] XNT:[id number] DOB: Feb 26, 1956 DOA: 08/30/2016  PCP: Todd Fraise, MD  Admit date: 08/30/2016 Discharge date: 09/04/2016  Admitted From: [place] Disposition: [facility]  Recommendations for Outpatient Follow-up:  1. Follow up with PCP in 1-2 weeks 2. Monitor basic metabolic panel. Discharge creatinine is 1.65.  3. Lisinopril has been added to the allergy list as it caused hyperkalemic renal failure.  4. Recommend outpatient nephrology follow up if indicated. 5. Will need repeat voiding trial, consider urology follow up if urinary retention continues. 6. Has follow up with orthopedics, Dr. Sharol Given 3/29 @12 :45pm 7. Follow up with cardiology, Dr. Stanford Breed, for consideration of ischemic work up for mildly reduced EF and focal wall motion abnormalities on echocardiogram (see below). 8. Started lipitor for PVD, monitor for tolerance and lipid panel as indicated.   Home Health: N/A Equipment/Devices: Foley replaced 3/24 Discharge Condition: Stable CODE STATUS: Full Diet recommendation: Heart healthy  Brief/Interim Summary: Todd [name] is a 61 y.o. male brought from SNF for AMS found to be hypotensive and hyperkalemic with acute renal failure. Subsequently admitted to ICU for CRRT, never required pressors. Renal failure thought to be due to hypotension in addition to lisinopril, triamterene, and HCTZ which have been stopped. CRRT completed 3/21 with sustained improvement in renal function and urine output. With improvement, he was transferred to the medical floor 3/22 onto hospitalist service 3/23. Nephrology signed off 3/23, recommending discontinuation of HD line. Echocardiogram showed mildly reduced EF (45-50%) with focal basal/mid-inferior myocardium hypokinesis. The patient had modest troponin elevation earlier in the admission in the setting of renal failure and denies ever having had chest pain. ECG showed no changes from prior with diffuse  TWI. Cardiology was consulted, recommended outpatient follow up with consideration of stress test, wishing to avoid contrast for catheterization in the absence of ACS. Creatinine continued to improve and urine output has normalized. Discharge creatinine is 1.65. He will be discharged back to the [facility] in stable condition.    Discharge Diagnoses:  Active Problems:   AKI (acute kidney injury) ([place])   Acute on chronic kidney failure (HCC)  Acute renal failure: Pre-renal and ATN due to hypotension with nephrotoxic meds (lisinopril, triamterene, HCTZ), required CRRT 3/20 - 3/21 due to hyperkalemia.  - Creatinine and urine output continue to improve while holding offending medications.  - Permanently discontinue ACE inhibitors (on allergy list).  - Discontinued HD cath.  - Avoid nephrotoxins  - Bicarb gtt stopped  Acute urinary retention: Failed voiding trial 3/24, suspect due to acute illness and BPH.  - Discharge with foley and repeat voiding trial, recommend flomax and urology follow up if fails voiding trial.   Acute encephalopathy: Uremic, resolved.  - Monitor, decreased lyrica to 200mg  daily given renal function.   Peripheral vascular disease: s/p bilateral BKA (3/2 and 3/5).  - Continue ASA, start lipitor 40mg  (check LFTs and FLP in 4-6 weeks) - Has follow up with Dr. Sharol Given 3/29 @12 :45pm - Air mattress - PT/OT recommended return to SNF - Prescribed percocet prn pain, please continue monitoring pain control. Pt is generally stoic about pain. Avoid NSAIDs  HTN and left ventricle systolic dysfunction: Echocardiogram 3/23 read as EF 45-50% with basal midinferior myocardial hypokinesis. There is no evidence of ACS with unchanged ECG, no angina. - Follow up with cardiology, Dr. Stanford Breed, as outpatient for consideration of ischemic work up.  - Will not start ACE/ARB as cardiology consultant re-reading echocardiogram believe LV fxn essentially normal and risk of renal failure  is so  significant.  - Continue coreg - Holding HCTZ, triamterene > BP normotensive. - Heart healthy diet  T2DM: Controlled CBGs with SSI.  - Resume home medications except metformin for now until baseline renal function is certain.   Thrombocytosis/leukocytosis: Reactive to acute illness, not primary.  - Thrombocytosis improving - Leukocytosis stable, afebrile, no evidence of PNA, UTI, cellulitis and blood cultures from 3/20 remain no growth to date.   Hypothyroidism: Chronic, stable. TSH 1.726 in Jan 2018.  - Continue home synthroid  Discharge Instructions  Allergies as of 09/04/2016      Reactions   Lisinopril Other (See Comments)   Hyperkalemia / Renal failure      Medication List    STOP taking these medications   metFORMIN 500 MG tablet Commonly known as:  GLUCOPHAGE   triamterene-hydrochlorothiazide 37.5-25 MG tablet Commonly known as:  MAXZIDE-25     TAKE these medications   acetaminophen 325 MG tablet Commonly known as:  TYLENOL Take 2 tablets (650 mg total) by mouth every 6 (six) hours as needed for mild pain (or Fever >/= 101).   aspirin 325 MG EC tablet Take 1 tablet (325 mg total) by mouth daily.   atorvastatin 40 MG tablet Commonly known as:  LIPITOR Take 1 tablet (40 mg total) by mouth daily at 6 PM.   carvedilol 3.125 MG tablet Commonly known as:  COREG Take 1 tablet (3.125 mg total) by mouth 2 (two) times daily with a meal.   COLCRYS 0.6 MG tablet Generic drug:  colchicine Take 0.6 mg by mouth 2 (two) times daily.   feeding supplement (GLUCERNA SHAKE) Liqd Take 237 mLs by mouth 2 (two) times daily between meals.   Insulin Glargine 100 UNIT/ML Solostar Pen Commonly known as:  LANTUS SOLOSTAR Inject 10 Units into the skin daily.   levothyroxine 100 MCG tablet Commonly known as:  SYNTHROID, LEVOTHROID Take 1 tablet (100 mcg total) by mouth daily before breakfast.   loperamide 2 MG capsule Commonly known as:  IMODIUM Take 4 mg by mouth as  needed for diarrhea or loose stools.   oxyCODONE-acetaminophen 5-325 MG tablet Commonly known as:  PERCOCET/ROXICET Take 1 tablet by mouth every 4 (four) hours as needed for moderate pain.   polyethylene glycol packet Commonly known as:  MIRALAX / GLYCOLAX Take 17 g by mouth daily.   pregabalin 200 MG capsule Commonly known as:  LYRICA Take 1 capsule (200 mg total) by mouth daily. What changed:  how much to take      Follow-up Information    [name],WARREN, MD. Schedule an appointment as soon as possible for a visit.   Specialty:  Family Medicine Contact information: [place] [address] [phone]        Newt Minion, MD Follow up on 09/08/2016.   Specialty:  Orthopedic Surgery Why:  12:45pm Contact information: [place] [address] [phone]        Kirk Ruths, MD. Schedule an appointment as soon as possible for a visit in 1 month(s).   Specialty:  Cardiology Contact information: [address] [place] [address] [phone]          Allergies  Allergen Reactions  . Lisinopril Other (See Comments)    Hyperkalemia / Renal failure    Consultations:  CCM  Nephrology, Dr. Moshe Cipro  Cardiology, Dr. Stanford Breed  Procedures/Studies: US Renal Port  Result Date: 08/31/2016 CLINICAL DATA:  Acute onset of renal insufficiency. Initial encounter. EXAM:  RENAL / URINARY TRACT ULTRASOUND COMPLETE COMPARISON:  Renal ultrasound performed 07/04/2016 FINDINGS: Right Kidney: Length: 11.6 cm. Echogenicity within normal limits. No mass or hydronephrosis visualized. Left Kidney: Length: 11.8 cm. Echogenicity within normal limits. No mass or hydronephrosis visualized. Bladder: Decompressed, with a Foley catheter in place. IMPRESSION: Unremarkable renal ultrasound.  No evidence of hydronephrosis. Electronically Signed   By: Garald Balding [name].D.   On: 08/31/2016 03:13   US Arterial Seg Multiple  Result Date:  08/09/2016 CLINICAL DATA:  Worsening right foot cellulitis. Left lower extremity ischemia with multiple ulcers. EXAM: NONINVASIVE PHYSIOLOGIC VASCULAR STUDY OF BILATERAL LOWER EXTREMITIES TECHNIQUE: Evaluation of both lower extremities was performed at rest, including calculation of ankle-brachial indices, multiple segmental pressure evaluation, segmental Doppler and segmental pulse volume recording. COMPARISON:  None. FINDINGS: Right ABI:  0.29 Left ABI: 0.00. Doppler signal not detected in the left dorsalis pedis artery or left posterior tibial artery. Right Lower Extremity: Monophasic waveforms throughout the right lower extremity. Low amplitude PVR waveforms in the right lower extremity. Unable to detect signal in the right DP. Left Lower Extremity: There may be some biphasic and triphasic Doppler waveforms in the left femoral region. Monophasic waveforms in the left SFA. No significant signal in the left popliteal artery. No significant signal identified in the left posterior tibial artery or left dorsalis pedis artery. Abnormal PVR waveforms. Segmental pressures not obtained in the left upper leg. IMPRESSION: Left ABI could not be obtained. Left ankle arteries appear to be occluded. Severe occlusive arterial disease in the right lower extremity with ABI of 0.29. Electronically Signed   By: Markus Daft [name].D.   On: 08/09/2016 16:59   Dg Chest Port 1 View  Result Date: 08/31/2016 CLINICAL DATA:  61 year old male with central line placement. EXAM: PORTABLE CHEST 1 VIEW COMPARISON:  Chest radiograph dated 08/30/2016 FINDINGS: There has been interval placement of a right IJ central line with tip over central SVC. There is no pneumothorax. Minimal bibasilar linear and platelike atelectatic changes noted. No focal consolidation, pleural effusion. The cardiac silhouette is within normal limits. No acute osseous pathology. Multiple surgical clips at the base of the neck likely from prior thyroid surgery. IMPRESSION:  Interval placement of a right IJ central line with tip over central SVC. No pneumothorax. Electronically Signed   By: Anner Crete [name].D.   On: 08/31/2016 00:29   Dg Chest Portable 1 View  Result Date: 08/08/2016 CLINICAL DATA:  Left foot swelling starting last night, open blisters today EXAM: PORTABLE CHEST 1 VIEW COMPARISON:  07/03/2016 FINDINGS: Cardiomediastinal silhouette is stable. No infiltrate or pleural effusion. No pulmonary edema. Bony thorax is unremarkable. IMPRESSION: No active disease. Electronically Signed   By: Lahoma Crocker [name].D.   On: 08/08/2016 17:06   Dg Foot 2 Views Left  Result Date: 08/08/2016 CLINICAL DATA:  Left foot swelling last night, open blisters today. History of diabetes, gout, hypertension. EXAM: LEFT FOOT - 2 VIEW COMPARISON:  Plain film of the left foot dated 07/03/2016. FINDINGS: Osseous alignment is stable. No acute or suspicious osseous finding. Again noted is a stable benign appearing chondroid lesion versus old bone infarct within the lower right tibia. Soft tissues about the right foot and ankle are unremarkable. IMPRESSION: No acute findings. Electronically Signed   By: Franki Cabot [name].D.   On: 08/08/2016 17:10   Echocardiogram 3/22:  Left ventricle: The cavity size was normal. Systolic function was mildly reduced. The estimated ejection fraction was in the range of 45% to 50%.  Hypokinesis of the basal-midinferior myocardium. Hypokinesis of the basal-midanteroseptal myocardium.  Subjective: Pt without complaints. Stump pain is controlled/minimal. No chest pain or dyspnea.   Discharge Exam: Vitals:   09/04/16 0603 09/04/16 0916  BP: 119/66 (!) 94/51  Pulse: 89 85  Resp: 18 18  Temp: 98.5 F (36.9 C) 98.5 F (36.9 C)   General exam: Todd [name] in no distress Respiratory system: Non-labored breathing room air. Clear to auscultation bilaterally.  Cardiovascular system: Regular rate and rhythm. No murmur, rub, or gallop. No  JVD. Gastrointestinal system: Abdomen soft, non-tender, non-distended, with normoactive bowel sounds. No organomegaly or masses felt. Central nervous system: Alert and oriented. No focal neurological deficits. Extremities: Bilateral BKA with stump c/d/i Skin: No rashes, lesions other than BKA stumps.  Psychiatry: Judgement and insight appear normal. Mood & affect appropriate.   The results of significant diagnostics from this hospitalization (including imaging, microbiology, ancillary and laboratory) are listed below for reference.    Labs: BNP (last 3 results)  Recent Labs  07/03/16 2143 08/03/16 1119  BNP 37.0 277.4*   Basic Metabolic Panel:  Recent Labs Lab 08/30/16 2155 08/31/16 0131  08/31/16 1600 09/01/16 0355 09/02/16 0344 09/02/16 1049 09/03/16 0622 09/04/16 0526  NA 135 135  < > 135 136  --  136 134* 134*  K 6.4* 6.1*  < > 4.3 4.2  --  3.6 3.7 3.6  CL 103 102  < > 99* 98*  --  101 101 98*  CO2 15* 17*  < > 28 27  --  23 21* 21*  GLUCOSE 87 86  < > 96 101*  --  100* 101* 113*  BUN 110* 101*  < > 23* 26*  --  26* 23* 25*  CREATININE 5.68* 4.99*  < > 1.58* 2.14*  --  1.89* 1.55* 1.65*  CALCIUM 8.7* 8.6*  < > 8.1* 7.8*  --  7.9* 8.1* 8.3*  MG 2.4 2.4  --   --  2.1  --   --   --   --   PHOS 8.2* 7.4*  < > 2.9 3.5 5.1* 4.5 5.4* 5.9*  < > = values in this interval not displayed. Liver Function Tests:  Recent Labs Lab 08/30/16 2330  08/31/16 1600 09/01/16 0355 09/02/16 1049 09/03/16 0622 09/04/16 0526  AST 88*  --   --   --   --   --   --   ALT 60  --   --   --   --   --   --   ALKPHOS 133*  --   --   --   --   --   --   BILITOT 0.9  --   --   --   --   --   --   PROT 8.3*  --   --   --   --   --   --   ALBUMIN 2.1*  < > 2.1* 1.9* 1.9* 2.2* 2.2*  < > = values in this interval not displayed.  CBC:  Recent Labs Lab 08/30/16 2155 08/31/16 0131 09/01/16 0355 09/02/16 0344 09/03/16 0627  WBC 20.5* 19.5* 22.7* 19.8* 22.0*  HGB 9.2* 10.8* 8.4* 8.5* 8.5*   HCT 28.5* 32.7* 26.3* 27.1* 27.0*  MCV 82.8 82.6 82.7 83.1 82.3  PLT 1,009* 651* 766* 710* 668*   Cardiac Enzymes:  Recent Labs Lab 08/30/16 2155 08/31/16 0131 08/31/16 0757  CKTOTAL  --  3,569*  --   TROPONINI 0.04*  0.03* 0.04*   CBG:  Recent Labs Lab 09/03/16 0745 09/03/16 1145 09/03/16 1636 09/03/16 2322 09/04/16 1226  GLUCAP 158* 149* 119* 117* 110*   Urinalysis    Component Value Date/Time   COLORURINE YELLOW 08/30/2016 2310   APPEARANCEUR HAZY (A) 08/30/2016 2310   APPEARANCEUR Clear 08/05/2016 1630   LABSPEC 1.018 08/30/2016 2310   PHURINE 5.0 08/30/2016 2310   GLUCOSEU NEGATIVE 08/30/2016 2310   HGBUR MODERATE (A) 08/30/2016 2310   BILIRUBINUR NEGATIVE 08/30/2016 2310   BILIRUBINUR Negative 08/05/2016 [place] 08/30/2016 2310   PROTEINUR NEGATIVE 08/30/2016 2310   NITRITE NEGATIVE 08/30/2016 2310   LEUKOCYTESUR NEGATIVE 08/30/2016 2310   LEUKOCYTESUR Negative 08/05/2016 1630    Microbiology Recent Results (from the past 240 hour(s))  MRSA PCR Screening     Status: Abnormal   Collection Time: 08/30/16  9:49 PM  Result Value Ref Range Status   MRSA by PCR POSITIVE (A) NEGATIVE Final    Comment:        The GeneXpert MRSA Assay (FDA approved for NASAL specimens only), is one component of a comprehensive MRSA colonization surveillance program. It is not intended to diagnose MRSA infection nor to guide or monitor treatment for MRSA infections. RESULT CALLED TO, READ BACK BY AND VERIFIED WITH: L WALSH,RN @0041  08/31/16 MKELLY,MLT   Culture, blood (routine x 2)     Status: None (Preliminary result)   Collection Time: 08/30/16  9:55 PM  Result Value Ref Range Status   Specimen Description BLOOD RIGHT ANTECUBITAL  Final   Special Requests BOTTLES DRAWN AEROBIC AND ANAEROBIC 5CC EACH  Final   Culture NO GROWTH 4 DAYS  Final   Report Status PENDING  Incomplete  Culture, blood (routine x 2)     Status: None (Preliminary result)    Collection Time: 08/30/16  9:55 PM  Result Value Ref Range Status   Specimen Description BLOOD LEFT HAND  Final   Special Requests BOTTLES DRAWN AEROBIC AND ANAEROBIC 5CC EACH  Final   Culture NO GROWTH 4 DAYS  Final   Report Status PENDING  Incomplete    Time coordinating discharge: Approximately 40 minutes  Vance Gather, MD  [facility] 09/04/2016, 12:42 PM Pager [phone]

## 2016-09-04 DIAGNOSIS — J9690 Respiratory failure, unspecified, unspecified whether with hypoxia or hypercapnia: Secondary | ICD-10-CM

## 2016-09-04 DIAGNOSIS — J9601 Acute respiratory failure with hypoxia: Secondary | ICD-10-CM

## 2016-09-04 LAB — CULTURE, BLOOD (ROUTINE X 2)
Culture: NO GROWTH
Culture: NO GROWTH

## 2016-09-04 LAB — RENAL FUNCTION PANEL
ALBUMIN: 2.2 g/dL — AB (ref 3.5–5.0)
ANION GAP: 15 (ref 5–15)
BUN: 25 mg/dL — ABNORMAL HIGH (ref 6–20)
CO2: 21 mmol/L — ABNORMAL LOW (ref 22–32)
Calcium: 8.3 mg/dL — ABNORMAL LOW (ref 8.9–10.3)
Chloride: 98 mmol/L — ABNORMAL LOW (ref 101–111)
Creatinine, Ser: 1.65 mg/dL — ABNORMAL HIGH (ref 0.61–1.24)
GFR calc Af Amer: 50 mL/min — ABNORMAL LOW (ref >60)
GFR, EST NON AFRICAN AMERICAN: 43 mL/min — AB (ref >60)
Glucose, Bld: 113 mg/dL — ABNORMAL HIGH (ref 65–99)
PHOSPHORUS: 5.9 mg/dL — AB (ref 2.5–4.6)
POTASSIUM: 3.6 mmol/L (ref 3.5–5.1)
Sodium: 134 mmol/L — ABNORMAL LOW (ref 135–145)

## 2016-09-04 LAB — GLUCOSE, CAPILLARY: GLUCOSE-CAPILLARY: 110 mg/dL — AB (ref 65–99)

## 2016-09-04 MED ORDER — PREGABALIN 200 MG PO CAPS: 200.0000 mg | ORAL_CAPSULE | Freq: Every day | ORAL | 2 refills | Status: DC

## 2016-09-04 MED ORDER — TRAMADOL-ACETAMINOPHEN 37.5-325 MG PO TABS: 1.0000 | ORAL_TABLET | Freq: Four times a day (QID) | ORAL | 0 refills | Status: DC | PRN

## 2016-09-04 MED ORDER — TAMSULOSIN HCL 0.4 MG PO CAPS
0.4000 mg | ORAL_CAPSULE | Freq: Every day | ORAL | Status: DC
  Administered 2016-09-04: 0.4 mg via ORAL
  Filled 2016-09-04: qty 1

## 2016-09-04 MED ORDER — TRAMADOL-ACETAMINOPHEN 37.5-325 MG PO TABS
1.0000 | ORAL_TABLET | Freq: Once | ORAL | Status: AC
  Administered 2016-09-04: 1 via ORAL
  Filled 2016-09-04: qty 1

## 2016-09-04 MED ORDER — OXYCODONE-ACETAMINOPHEN 5-325 MG PO TABS: 1.0000 | ORAL_TABLET | ORAL | 0 refills | Status: DC | PRN

## 2016-09-04 MED ORDER — TRAMADOL-ACETAMINOPHEN 37.5-325 MG PO TABS: 1.0000 | ORAL_TABLET | Freq: Four times a day (QID) | ORAL | Status: DC | PRN

## 2016-09-04 MED ORDER — ATORVASTATIN CALCIUM 40 MG PO TABS: 40.0000 mg | ORAL_TABLET | Freq: Every day | ORAL | Status: DC

## 2016-09-04 NOTE — Clinical Social Work Placement (Signed)
   CLINICAL SOCIAL WORK PLACEMENT  NOTE  Date:  09/04/2016  Patient Details  Name: Todd [name] MRN: [id number] Date of Birth: April 09, 1956  Clinical Social Work is seeking post-discharge placement for this patient at the [place] level of care (*CSW will initial, date and re-position this form in  chart as items are completed):  Yes   Patient/family provided with [place] Work Department's list of facilities offering this level of care within the geographic area requested by the patient (or if unable, by the patient's family).  Yes   Patient/family informed of their freedom to choose among providers that offer the needed level of care, that participate in Medicare, Medicaid or managed care program needed by the patient, have an available bed and are willing to accept the patient.  Yes   Patient/family informed of [facility]'s ownership interest in [facility] and [facility], as well as of the fact that they are under no obligation to receive care at these facilities.  PASRR submitted to EDS on       PASRR number received on 08/18/16     Existing PASRR number confirmed on 09/04/16     FL2 transmitted to all facilities in geographic area requested by pt/family on       FL2 transmitted to all facilities within larger geographic area on       Patient informed that his/her managed care company has contracts with or will negotiate with certain facilities, including the following:        Yes   Patient/family informed of bed offers received.  Patient chooses bed at  (Pt from [facility])     Physician recommends and patient chooses bed at      Patient to be transferred to  [facility]) on 09/04/16.  Patient to be transferred to facility by  [name])     Patient family notified on 09/04/16 of transfer.  Name of family member notified:  Family @ bedside     PHYSICIAN       Additional Comment:     _______________________________________________ Serafina Mitchell, [place] 09/04/2016, 2:54 PM

## 2016-09-04 NOTE — Progress Notes (Signed)
Pt prepared for d/c to SNF. IV d/c'd. Skin intact except as charted in most recent assessments. Vitals are stable. Report called to [facility] (receiving facility). Pt to be transported by ambulance service.  Jillyn Ledger, MBA, BSN, RN

## 2016-09-04 NOTE — Clinical Social Work Note (Signed)
5 day LOG approved by Asst. Director Mayer Masker). We are awaiting auth from [facility], PT evaluation was not done until 3/24, auth submitted on 3/23 with no PT notes. Auth not likely to happen until 3/26, pt medically cleared 3/25. Facility [facility]) willing to accept 5 day LOG.   Quinlin Conant B. Joline Maxcy Clinical Social Work Dept Weekend Social Worker [phone] 3:02 PM

## 2016-09-04 NOTE — Progress Notes (Signed)
Patient is very restless and unable to sleep.  He finally admitted to pain in BLE.  MD called and made aware.  Order received for Ultracet which was given at 0249.  Will continue to monitor.    Patient had Foley catheter removed at [facility] Saturday afternoon.  No evidence of patient urinating.  Patient states he does not feel urge to urinate.  Bladder done and showed greater than 450 cc of urine in the bladder.  MD made aware.  Order received for Foley Catheter.  Will continue to monitor patient.  [employer] RN-BC, WTA.

## 2016-09-04 NOTE — Clinical Social Work Note (Signed)
Clinical Social Worker facilitated patient discharge including contacting patient family and facility to confirm patient discharge plans.  Clinical information faxed to facility and family agreeable with plan.  CSW arranged ambulance transport via [facility] to [place] to call report prior to discharge.  Clinical Social Worker will sign off for now as social work intervention is no longer needed. Please consult [place] again if new need arises.  Cristian Grieves B. Joline Maxcy Clinical Social Work Dept Weekend Social Worker [phone] 2:52 PM

## 2016-09-08 ENCOUNTER — Encounter: Payer: BLUE CROSS/BLUE SHIELD | Admitting: Vascular Surgery

## 2016-09-08 ENCOUNTER — Encounter (HOSPITAL_COMMUNITY): Payer: Self-pay | Admitting: *Deleted

## 2016-09-08 ENCOUNTER — Inpatient Hospital Stay (HOSPITAL_COMMUNITY)
Admission: EM | Admit: 2016-09-08 | Discharge: 2016-09-16 | DRG: 853 | Disposition: A | Discharge status: Skilled Nursing Facility | Payer: BLUE CROSS/BLUE SHIELD | Attending: Internal Medicine | Admitting: Internal Medicine

## 2016-09-08 ENCOUNTER — Telehealth: Payer: Self-pay | Admitting: Family Medicine

## 2016-09-08 ENCOUNTER — Emergency Department (HOSPITAL_COMMUNITY): Payer: BLUE CROSS/BLUE SHIELD

## 2016-09-08 ENCOUNTER — Inpatient Hospital Stay (HOSPITAL_COMMUNITY): Payer: BLUE CROSS/BLUE SHIELD

## 2016-09-08 ENCOUNTER — Telehealth (INDEPENDENT_AMBULATORY_CARE_PROVIDER_SITE_OTHER): Payer: Self-pay | Admitting: Radiology

## 2016-09-08 ENCOUNTER — Inpatient Hospital Stay (INDEPENDENT_AMBULATORY_CARE_PROVIDER_SITE_OTHER): Payer: BLUE CROSS/BLUE SHIELD | Admitting: Orthopedic Surgery

## 2016-09-08 DIAGNOSIS — M109 Gout, unspecified: Secondary | ICD-10-CM | POA: Diagnosis present

## 2016-09-08 DIAGNOSIS — M24561 Contracture, right knee: Secondary | ICD-10-CM | POA: Diagnosis present

## 2016-09-08 DIAGNOSIS — K529 Noninfective gastroenteritis and colitis, unspecified: Secondary | ICD-10-CM | POA: Diagnosis present

## 2016-09-08 DIAGNOSIS — D72829 Elevated white blood cell count, unspecified: Secondary | ICD-10-CM

## 2016-09-08 DIAGNOSIS — N183 Chronic kidney disease, stage 3 (moderate): Secondary | ICD-10-CM | POA: Diagnosis present

## 2016-09-08 DIAGNOSIS — R7989 Other specified abnormal findings of blood chemistry: Secondary | ICD-10-CM | POA: Diagnosis present

## 2016-09-08 DIAGNOSIS — N400 Enlarged prostate without lower urinary tract symptoms: Secondary | ICD-10-CM | POA: Diagnosis present

## 2016-09-08 DIAGNOSIS — E875 Hyperkalemia: Secondary | ICD-10-CM | POA: Diagnosis present

## 2016-09-08 DIAGNOSIS — N179 Acute kidney failure, unspecified: Secondary | ICD-10-CM | POA: Diagnosis present

## 2016-09-08 DIAGNOSIS — L89313 Pressure ulcer of right buttock, stage 3: Secondary | ICD-10-CM | POA: Diagnosis present

## 2016-09-08 DIAGNOSIS — I2510 Atherosclerotic heart disease of native coronary artery without angina pectoris: Secondary | ICD-10-CM | POA: Diagnosis present

## 2016-09-08 DIAGNOSIS — G8929 Other chronic pain: Secondary | ICD-10-CM | POA: Diagnosis present

## 2016-09-08 DIAGNOSIS — T8743 Infection of amputation stump, right lower extremity: Secondary | ICD-10-CM

## 2016-09-08 DIAGNOSIS — Z794 Long term (current) use of insulin: Secondary | ICD-10-CM

## 2016-09-08 DIAGNOSIS — Z8249 Family history of ischemic heart disease and other diseases of the circulatory system: Secondary | ICD-10-CM

## 2016-09-08 DIAGNOSIS — E440 Moderate protein-calorie malnutrition: Secondary | ICD-10-CM | POA: Diagnosis present

## 2016-09-08 DIAGNOSIS — E1152 Type 2 diabetes mellitus with diabetic peripheral angiopathy with gangrene: Secondary | ICD-10-CM | POA: Diagnosis present

## 2016-09-08 DIAGNOSIS — Z833 Family history of diabetes mellitus: Secondary | ICD-10-CM

## 2016-09-08 DIAGNOSIS — R945 Abnormal results of liver function studies: Secondary | ICD-10-CM

## 2016-09-08 DIAGNOSIS — E1121 Type 2 diabetes mellitus with diabetic nephropathy: Secondary | ICD-10-CM | POA: Diagnosis present

## 2016-09-08 DIAGNOSIS — IMO0001 Reserved for inherently not codable concepts without codable children: Secondary | ICD-10-CM

## 2016-09-08 DIAGNOSIS — I129 Hypertensive chronic kidney disease with stage 1 through stage 4 chronic kidney disease, or unspecified chronic kidney disease: Secondary | ICD-10-CM | POA: Diagnosis present

## 2016-09-08 DIAGNOSIS — A419 Sepsis, unspecified organism: Principal | ICD-10-CM | POA: Diagnosis present

## 2016-09-08 DIAGNOSIS — R197 Diarrhea, unspecified: Secondary | ICD-10-CM | POA: Diagnosis not present

## 2016-09-08 DIAGNOSIS — Y835 Amputation of limb(s) as the cause of abnormal reaction of the patient, or of later complication, without mention of misadventure at the time of the procedure: Secondary | ICD-10-CM | POA: Diagnosis present

## 2016-09-08 DIAGNOSIS — E1122 Type 2 diabetes mellitus with diabetic chronic kidney disease: Secondary | ICD-10-CM | POA: Diagnosis present

## 2016-09-08 DIAGNOSIS — Z89511 Acquired absence of right leg below knee: Secondary | ICD-10-CM

## 2016-09-08 DIAGNOSIS — E785 Hyperlipidemia, unspecified: Secondary | ICD-10-CM | POA: Diagnosis present

## 2016-09-08 DIAGNOSIS — E039 Hypothyroidism, unspecified: Secondary | ICD-10-CM | POA: Diagnosis present

## 2016-09-08 DIAGNOSIS — Z89512 Acquired absence of left leg below knee: Secondary | ICD-10-CM

## 2016-09-08 DIAGNOSIS — D638 Anemia in other chronic diseases classified elsewhere: Secondary | ICD-10-CM | POA: Diagnosis present

## 2016-09-08 DIAGNOSIS — M79604 Pain in right leg: Secondary | ICD-10-CM | POA: Diagnosis not present

## 2016-09-08 DIAGNOSIS — T814XXA Infection following a procedure, initial encounter: Secondary | ICD-10-CM

## 2016-09-08 DIAGNOSIS — N4829 Other inflammatory disorders of penis: Secondary | ICD-10-CM | POA: Diagnosis present

## 2016-09-08 DIAGNOSIS — Z888 Allergy status to other drugs, medicaments and biological substances status: Secondary | ICD-10-CM

## 2016-09-08 DIAGNOSIS — Z79899 Other long term (current) drug therapy: Secondary | ICD-10-CM

## 2016-09-08 DIAGNOSIS — R7401 Elevation of levels of liver transaminase levels: Secondary | ICD-10-CM

## 2016-09-08 DIAGNOSIS — L03818 Cellulitis of other sites: Secondary | ICD-10-CM | POA: Diagnosis not present

## 2016-09-08 DIAGNOSIS — T8149XA Infection following a procedure, other surgical site, initial encounter: Secondary | ICD-10-CM | POA: Diagnosis present

## 2016-09-08 DIAGNOSIS — L89323 Pressure ulcer of left buttock, stage 3: Secondary | ICD-10-CM | POA: Diagnosis present

## 2016-09-08 DIAGNOSIS — M79605 Pain in left leg: Secondary | ICD-10-CM | POA: Diagnosis present

## 2016-09-08 DIAGNOSIS — K5900 Constipation, unspecified: Secondary | ICD-10-CM

## 2016-09-08 DIAGNOSIS — R740 Nonspecific elevation of levels of transaminase and lactic acid dehydrogenase [LDH]: Secondary | ICD-10-CM | POA: Diagnosis present

## 2016-09-08 DIAGNOSIS — D75838 Other thrombocytosis: Secondary | ICD-10-CM

## 2016-09-08 DIAGNOSIS — F17210 Nicotine dependence, cigarettes, uncomplicated: Secondary | ICD-10-CM | POA: Diagnosis present

## 2016-09-08 DIAGNOSIS — Z7982 Long term (current) use of aspirin: Secondary | ICD-10-CM

## 2016-09-08 HISTORY — DX: Infection following a procedure, other surgical site, initial encounter: T81.49XA

## 2016-09-08 LAB — CBC WITH DIFFERENTIAL/PLATELET
Basophils Absolute: 0 10*3/uL (ref 0.0–0.1)
Basophils Relative: 0 %
Eosinophils Absolute: 0.3 10*3/uL (ref 0.0–0.7)
Eosinophils Relative: 1 %
HCT: 26.1 % — ABNORMAL LOW (ref 39.0–52.0)
HEMOGLOBIN: 8.6 g/dL — AB (ref 13.0–17.0)
LYMPHS PCT: 13 %
Lymphs Abs: 3.3 10*3/uL (ref 0.7–4.0)
MCH: 26.5 pg (ref 26.0–34.0)
MCHC: 33 g/dL (ref 30.0–36.0)
MCV: 80.3 fL (ref 78.0–100.0)
MONOS PCT: 5 %
Monocytes Absolute: 1.3 10*3/uL — ABNORMAL HIGH (ref 0.1–1.0)
NEUTROS PCT: 81 %
Neutro Abs: 20.2 10*3/uL — ABNORMAL HIGH (ref 1.7–7.7)
Platelets: 759 10*3/uL — ABNORMAL HIGH (ref 150–400)
RBC: 3.25 MIL/uL — AB (ref 4.22–5.81)
RDW: 14.8 % (ref 11.5–15.5)
WBC: 25.1 10*3/uL — ABNORMAL HIGH (ref 4.0–10.5)

## 2016-09-08 LAB — URINALYSIS, ROUTINE W REFLEX MICROSCOPIC
BILIRUBIN URINE: NEGATIVE
Glucose, UA: NEGATIVE mg/dL
HGB URINE DIPSTICK: NEGATIVE
Ketones, ur: NEGATIVE mg/dL
Leukocytes, UA: NEGATIVE
Nitrite: NEGATIVE
Protein, ur: 30 mg/dL — AB
Specific Gravity, Urine: 1.019 (ref 1.005–1.030)
pH: 5 (ref 5.0–8.0)

## 2016-09-08 LAB — COMPREHENSIVE METABOLIC PANEL
ALBUMIN: 2 g/dL — AB (ref 3.5–5.0)
ALK PHOS: 274 U/L — AB (ref 38–126)
ALT: 103 U/L — ABNORMAL HIGH (ref 17–63)
ANION GAP: 14 (ref 5–15)
AST: 163 U/L — AB (ref 15–41)
BUN: 49 mg/dL — ABNORMAL HIGH (ref 6–20)
CO2: 18 mmol/L — AB (ref 22–32)
Calcium: 8.4 mg/dL — ABNORMAL LOW (ref 8.9–10.3)
Chloride: 98 mmol/L — ABNORMAL LOW (ref 101–111)
Creatinine, Ser: 2.01 mg/dL — ABNORMAL HIGH (ref 0.61–1.24)
GFR calc Af Amer: 39 mL/min — ABNORMAL LOW (ref >60)
GFR calc non Af Amer: 34 mL/min — ABNORMAL LOW (ref >60)
Glucose, Bld: 94 mg/dL (ref 65–99)
POTASSIUM: 5.3 mmol/L — AB (ref 3.5–5.1)
Sodium: 130 mmol/L — ABNORMAL LOW (ref 135–145)
Total Bilirubin: 1.5 mg/dL — ABNORMAL HIGH (ref 0.3–1.2)
Total Protein: 8.7 g/dL — ABNORMAL HIGH (ref 6.5–8.1)

## 2016-09-08 LAB — CBC
HCT: 25.1 % — ABNORMAL LOW (ref 39.0–52.0)
Hemoglobin: 7.9 g/dL — ABNORMAL LOW (ref 13.0–17.0)
MCH: 25.6 pg — ABNORMAL LOW (ref 26.0–34.0)
MCHC: 31.5 g/dL (ref 30.0–36.0)
MCV: 81.2 fL (ref 78.0–100.0)
PLATELETS: 858 10*3/uL — AB (ref 150–400)
RBC: 3.09 MIL/uL — ABNORMAL LOW (ref 4.22–5.81)
RDW: 14.3 % (ref 11.5–15.5)
WBC: 24.3 10*3/uL — AB (ref 4.0–10.5)

## 2016-09-08 LAB — CREATININE, SERUM
Creatinine, Ser: 1.74 mg/dL — ABNORMAL HIGH (ref 0.61–1.24)
GFR calc non Af Amer: 41 mL/min — ABNORMAL LOW (ref >60)
GFR, EST AFRICAN AMERICAN: 47 mL/min — AB (ref >60)

## 2016-09-08 LAB — C-REACTIVE PROTEIN: CRP: 7.1 mg/dL — ABNORMAL HIGH (ref <1.0)

## 2016-09-08 LAB — I-STAT CG4 LACTIC ACID, ED: Lactic Acid, Venous: 1.52 mmol/L (ref 0.5–1.9)

## 2016-09-08 LAB — SEDIMENTATION RATE: Sed Rate: 138 mm/hr — ABNORMAL HIGH (ref 0–16)

## 2016-09-08 LAB — GLUCOSE, CAPILLARY: Glucose-Capillary: 104 mg/dL — ABNORMAL HIGH (ref 65–99)

## 2016-09-08 MED ORDER — SODIUM CHLORIDE 0.9 % IV BOLUS (SEPSIS)
1000.0000 mL | Freq: Once | INTRAVENOUS | Status: AC
  Administered 2016-09-08: 1000 mL via INTRAVENOUS

## 2016-09-08 MED ORDER — SODIUM CHLORIDE 0.9 % IV BOLUS (SEPSIS)
1000.0000 mL | Freq: Once | INTRAVENOUS | Status: AC
  Administered 2016-09-08 (×2): 1000 mL via INTRAVENOUS

## 2016-09-08 MED ORDER — VANCOMYCIN HCL IN DEXTROSE 750-5 MG/150ML-% IV SOLN
750.0000 mg | INTRAVENOUS | Status: DC
  Administered 2016-09-09 – 2016-09-12 (×4): 750 mg via INTRAVENOUS
  Filled 2016-09-08 (×5): qty 150

## 2016-09-08 MED ORDER — HYDRALAZINE HCL 20 MG/ML IJ SOLN: 10.0000 mg | Freq: Three times a day (TID) | INTRAMUSCULAR | Status: DC | PRN

## 2016-09-08 MED ORDER — FENTANYL CITRATE (PF) 100 MCG/2ML IJ SOLN
50.0000 ug | Freq: Once | INTRAMUSCULAR | Status: AC
  Administered 2016-09-08: 50 ug via INTRAVENOUS
  Filled 2016-09-08: qty 2

## 2016-09-08 MED ORDER — TAMSULOSIN HCL 0.4 MG PO CAPS
0.4000 mg | ORAL_CAPSULE | Freq: Every day | ORAL | Status: DC
  Administered 2016-09-09 – 2016-09-16 (×8): 0.4 mg via ORAL
  Filled 2016-09-08 (×8): qty 1

## 2016-09-08 MED ORDER — ASPIRIN EC 325 MG PO TBEC
325.0000 mg | DELAYED_RELEASE_TABLET | Freq: Every day | ORAL | Status: DC
  Administered 2016-09-09 – 2016-09-16 (×8): 325 mg via ORAL
  Filled 2016-09-08 (×8): qty 1

## 2016-09-08 MED ORDER — SODIUM CHLORIDE 0.9 % IV SOLN: INTRAVENOUS | Status: DC

## 2016-09-08 MED ORDER — LEVOTHYROXINE SODIUM 100 MCG PO TABS
100.0000 ug | ORAL_TABLET | Freq: Every day | ORAL | Status: DC
  Administered 2016-09-09 – 2016-09-16 (×8): 100 ug via ORAL
  Filled 2016-09-08 (×7): qty 1

## 2016-09-08 MED ORDER — ACETAMINOPHEN 650 MG RE SUPP: 650.0000 mg | Freq: Four times a day (QID) | RECTAL | Status: DC | PRN

## 2016-09-08 MED ORDER — ONDANSETRON HCL 4 MG/2ML IJ SOLN: 4.0000 mg | Freq: Four times a day (QID) | INTRAMUSCULAR | Status: DC | PRN

## 2016-09-08 MED ORDER — PIPERACILLIN-TAZOBACTAM 3.375 G IVPB
3.3750 g | Freq: Three times a day (TID) | INTRAVENOUS | Status: DC
  Administered 2016-09-08 – 2016-09-15 (×21): 3.375 g via INTRAVENOUS
  Filled 2016-09-08 (×22): qty 50

## 2016-09-08 MED ORDER — PREGABALIN 100 MG PO CAPS
200.0000 mg | ORAL_CAPSULE | Freq: Every day | ORAL | Status: DC
  Administered 2016-09-09 – 2016-09-16 (×8): 200 mg via ORAL
  Filled 2016-09-08 (×8): qty 2

## 2016-09-08 MED ORDER — INSULIN GLARGINE 100 UNIT/ML ~~LOC~~ SOLN
10.0000 [IU] | Freq: Every day | SUBCUTANEOUS | Status: DC
  Administered 2016-09-08 – 2016-09-16 (×8): 10 [IU] via SUBCUTANEOUS
  Filled 2016-09-08 (×9): qty 0.1

## 2016-09-08 MED ORDER — HEPARIN SODIUM (PORCINE) 5000 UNIT/ML IJ SOLN
5000.0000 [IU] | Freq: Three times a day (TID) | INTRAMUSCULAR | Status: DC
  Administered 2016-09-08 – 2016-09-16 (×21): 5000 [IU] via SUBCUTANEOUS
  Filled 2016-09-08 (×20): qty 1

## 2016-09-08 MED ORDER — ACETAMINOPHEN 325 MG PO TABS
650.0000 mg | ORAL_TABLET | Freq: Four times a day (QID) | ORAL | Status: DC | PRN
Start: 2016-09-08 — End: 2016-09-12

## 2016-09-08 MED ORDER — GLUCERNA SHAKE PO LIQD
237.0000 mL | Freq: Two times a day (BID) | ORAL | Status: DC
  Administered 2016-09-12: 237 mL via ORAL

## 2016-09-08 MED ORDER — COLCHICINE 0.6 MG PO TABS
0.6000 mg | ORAL_TABLET | Freq: Two times a day (BID) | ORAL | Status: DC
  Administered 2016-09-08 – 2016-09-16 (×16): 0.6 mg via ORAL
  Filled 2016-09-08 (×16): qty 1

## 2016-09-08 MED ORDER — ATORVASTATIN CALCIUM 40 MG PO TABS
40.0000 mg | ORAL_TABLET | Freq: Every day | ORAL | Status: DC
  Administered 2016-09-08 – 2016-09-15 (×7): 40 mg via ORAL
  Filled 2016-09-08 (×8): qty 1

## 2016-09-08 MED ORDER — INSULIN ASPART 100 UNIT/ML ~~LOC~~ SOLN
0.0000 [IU] | Freq: Three times a day (TID) | SUBCUTANEOUS | Status: DC
  Administered 2016-09-09 – 2016-09-14 (×2): 2 [IU] via SUBCUTANEOUS

## 2016-09-08 MED ORDER — LOPERAMIDE HCL 2 MG PO CAPS
4.0000 mg | ORAL_CAPSULE | ORAL | Status: DC | PRN
  Administered 2016-09-08: 4 mg via ORAL
  Administered 2016-09-09: 2 mg via ORAL
  Administered 2016-09-09 – 2016-09-11 (×4): 4 mg via ORAL
  Filled 2016-09-08 (×6): qty 2

## 2016-09-08 MED ORDER — SODIUM CHLORIDE 0.9 % IV SOLN
1000.0000 mL | INTRAVENOUS | Status: DC
  Administered 2016-09-08 (×2): 1000 mL via INTRAVENOUS

## 2016-09-08 MED ORDER — VANCOMYCIN HCL IN DEXTROSE 1-5 GM/200ML-% IV SOLN: 1000.0000 mg | Freq: Once | INTRAVENOUS | Status: DC

## 2016-09-08 MED ORDER — INSULIN ASPART 100 UNIT/ML ~~LOC~~ SOLN
4.0000 [IU] | Freq: Three times a day (TID) | SUBCUTANEOUS | Status: DC
  Administered 2016-09-10 – 2016-09-16 (×7): 4 [IU] via SUBCUTANEOUS

## 2016-09-08 MED ORDER — SODIUM CHLORIDE 0.9 % IV SOLN
1250.0000 mg | Freq: Once | INTRAVENOUS | Status: AC
  Administered 2016-09-08: 1250 mg via INTRAVENOUS
  Filled 2016-09-08: qty 1250

## 2016-09-08 MED ORDER — PIPERACILLIN-TAZOBACTAM 3.375 G IVPB 30 MIN
3.3750 g | Freq: Once | INTRAVENOUS | Status: AC
  Administered 2016-09-08: 3.375 g via INTRAVENOUS
  Filled 2016-09-08: qty 50

## 2016-09-08 MED ORDER — ONDANSETRON HCL 4 MG PO TABS
4.0000 mg | ORAL_TABLET | Freq: Four times a day (QID) | ORAL | Status: DC | PRN
  Administered 2016-09-10 – 2016-09-11 (×2): 4 mg via ORAL
  Filled 2016-09-08 (×2): qty 1

## 2016-09-08 MED ORDER — CARVEDILOL 3.125 MG PO TABS
3.1250 mg | ORAL_TABLET | Freq: Two times a day (BID) | ORAL | Status: DC
  Administered 2016-09-08 – 2016-09-16 (×15): 3.125 mg via ORAL
  Filled 2016-09-08 (×16): qty 1

## 2016-09-08 NOTE — Progress Notes (Signed)
Pharmacy Antibiotic Note  Todd [name] is a 61 y.o. male admitted on 09/08/2016 with sepsis.  Pharmacy has been consulted for vancomycin/zosyn dosing. Hx of L/R BKA. Afebrile, WBC 25.1. SCr 2.01 on admit (1.55 ~5 days ago), CrCl~32.  Plan: Zosyn 3.375g IV (35min inf) x1; then 3.375g IV q8h (4h inf) Vancomycin 1250mg  IV x1; then 750mg  IV q24h Monitor clinical progress, c/s, renal function, abx plan/LOT Vancomycin trough as indicated     Temp (24hrs), Avg:98.3 F (36.8 C), Min:98.3 F (36.8 C), Max:98.3 F (36.8 C)   Recent Labs Lab 09/02/16 0344 09/02/16 1049 09/03/16 0622 09/03/16 0627 09/04/16 0526  WBC 19.8*  --   --  22.0*  --   CREATININE  --  1.89* 1.55*  --  1.65*    Estimated Creatinine Clearance: 39.2 mL/min (A) (by C-G formula based on SCr of 1.65 mg/dL (H)).    Allergies  Allergen Reactions  . Lisinopril Other (See Comments)    Hyperkalemia / Renal failure    Elicia Lamp, PharmD, BCPS Clinical Pharmacist 09/08/2016 12:57 PM

## 2016-09-08 NOTE — H&P (Signed)
[facility] History and Physical  Todd [name] ELF:[id number] DOB: 05-Jan-1956 DOA: 09/08/2016  Referring physician:  PCP: [name], MD   Chief Complaint: leg pain  HPI: Todd [name] is a 61 y.o. male  passable is a 77, gout, hypertension, hyperlipidemia and recent hospitalization with left to right BKA. He presents with bilateral leg pain. States the pain has been worsening throbbing for last 3 days. Rates pain as 4-10. Has been taking outpatient narcotics with little to no effect. Patient states her some purulent asthma discharged with from his right stump.  ED Course: Dr. Sharol Given consulted. Abx started.   Review of Systems:  As per HPI otherwise 10 point review of systems negative.    Past Medical History:  Diagnosis Date  . Diabetes mellitus without complication ([place])   . Goiter   . Gout   . Hyperlipidemia   . Hypertension   . Thyroid disease    Past Surgical History:  Procedure Laterality Date  . ABDOMINAL AORTOGRAM N/A 08/11/2016   Procedure: Abdominal Aortogram;  Surgeon: Waynetta Sandy, MD;  Location: [facility];  Service: Cardiovascular;  Laterality: N/A;  . ABDOMINAL AORTOGRAM W/LOWER EXTREMITY N/A 08/15/2016   Procedure: Abdominal Aortogram w/Lower Extremity;  Surgeon: Elam Dutch, MD;  Location: [facility];  Service: Cardiovascular;  Laterality: N/A;  . AMPUTATION Left 08/12/2016   Procedure: LEFT BELOW KNEE AMPUTATION;  Surgeon: Newt Minion, MD;  Location: [facility];  Service: Orthopedics;  Laterality: Left;  . AMPUTATION Right 08/17/2016   Procedure: RIGHT BELOW KNEE AMPUTATION;  Surgeon: Elam Dutch, MD;  Location: [facility];  Service: Vascular;  Laterality: Right;  . LOWER EXTREMITY ANGIOGRAPHY Bilateral 08/11/2016   Procedure: Lower Extremity Angiography;  Surgeon: Waynetta Sandy, MD;  Location: [facility];  Service: Cardiovascular;  Laterality: Bilateral;  . PERIPHERAL VASCULAR BALLOON ANGIOPLASTY Left  08/11/2016   Procedure: Peripheral Vascular Balloon Angioplasty;  Surgeon: Waynetta Sandy, MD;  Location: [facility];  Service: Cardiovascular;  Laterality: Left;  SFA  . THYROID SURGERY     Social History:  reports that he has been smoking Cigarettes.  He has a 33.75 pack-year smoking history. He has never used smokeless tobacco. He reports that he does not drink alcohol or use drugs.  Allergies  Allergen Reactions  . Lisinopril Other (See Comments)    Hyperkalemia / Renal failure    Family History  Problem Relation Age of Onset  . Heart disease Mother   . Pneumonia Father   . Diabetes Maternal Aunt   . Diabetes Maternal Uncle      Prior to Admission medications   Medication Sig Start Date End Date Taking? Authorizing Provider  acetaminophen (TYLENOL) 325 MG tablet Take 2 tablets (650 mg total) by mouth every 6 (six) hours as needed for mild pain (or Fever >/= 101). 08/19/16  Yes Shanker Kristeen Mans, MD  aspirin EC 325 MG EC tablet Take 1 tablet (325 mg total) by mouth daily. 08/20/16  Yes Shanker Kristeen Mans, MD  atorvastatin (LIPITOR) 40 MG tablet Take 1 tablet (40 mg total) by mouth daily at 6 PM. 09/04/16  Yes Patrecia Pour, MD  carvedilol (COREG) 3.125 MG tablet Take 1 tablet (3.125 mg total) by mouth 2 (two) times daily with a meal. 07/06/16  Yes Thurnell Lose, MD  COLCRYS 0.6 MG tablet Take 0.6 mg by mouth 2 (two) times daily. 07/29/16  Yes Historical Provider, MD  feeding supplement, [place], ([place]  SHAKE) LIQD Take 237 mLs by mouth 2 (two) times daily between meals. 08/19/16  Yes Shanker Kristeen Mans, MD  Insulin Glargine (LANTUS SOLOSTAR) 100 UNIT/ML Solostar Pen Inject 10 Units into the skin daily. 08/19/16  Yes Shanker Kristeen Mans, MD  levothyroxine (SYNTHROID, LEVOTHROID) 100 MCG tablet Take 1 tablet (100 mcg total) by mouth daily before breakfast. 07/07/16  Yes Thurnell Lose, MD  loperamide (IMODIUM) 2 MG capsule Take 4 mg by mouth as needed for diarrhea or  loose stools.   Yes Historical Provider, MD  oxyCODONE-acetaminophen (PERCOCET/ROXICET) 5-325 MG tablet Take by mouth every 4 (four) hours as needed for severe pain.   Yes Historical Provider, MD  polyethylene glycol (MIRALAX / GLYCOLAX) packet Take 17 g by mouth daily. 08/19/16  Yes Shanker Kristeen Mans, MD  pregabalin (LYRICA) 200 MG capsule Take 1 capsule (200 mg total) by mouth daily. 09/04/16  Yes Patrecia Pour, MD  tamsulosin (FLOMAX) 0.4 MG CAPS capsule Take 0.4 mg by mouth daily.   Yes Historical Provider, MD  traMADol-acetaminophen (ULTRACET) 37.5-325 MG tablet Take 1 tablet by mouth every 6 (six) hours as needed. 09/04/16  Yes Patrecia Pour, MD   Physical Exam: Vitals:   09/08/16 1211 09/08/16 1215 09/08/16 1300 09/08/16 1315  BP:  101/74  93/66  Pulse:  74  74  Resp:  19  18  Temp:   99.6 F (37.6 C)   TempSrc:   Rectal   SpO2: 99% 99%  100%    Wt Readings from Last 3 Encounters:  09/04/16 59 kg (130 lb 1.1 oz)  08/10/16 69.7 kg (153 lb 9.6 oz)  07/15/16 76.1 kg (167 lb 12.8 oz)    General:  Appears calm and comfortable Eyes:  PERRL, EOMI, normal lids, iris ENT:  grossly normal hearing, lips & tongue Neck:  no LAD, masses or thyromegaly Cardiovascular:  RRR, no m/r/g. No LE edema.  Respiratory:  CTA bilaterally, no w/r/r. Normal respiratory effort. Abdomen:  soft, ntnd Skin:  Poorly healing R BKA stump Musculoskeletal:  grossly normal tone BUE/BLE Psychiatric:  grossly normal mood and affect, speech fluent and appropriate Neurologic:  CN 2-12 grossly intact, moves all extremities in coordinated fashion.          Labs on Admission:  Basic Metabolic Panel:  Recent Labs Lab 09/02/16 0344 09/02/16 1049 09/03/16 0622 09/04/16 0526 09/08/16 1259  NA  --  136 134* 134* 130*  K  --  3.6 3.7 3.6 5.3*  CL  --  101 101 98* 98*  CO2  --  23 21* 21* 18*  GLUCOSE  --  100* 101* 113* 94  BUN  --  26* 23* 25* 49*  CREATININE  --  1.89* 1.55* 1.65* 2.01*  CALCIUM  --  7.9*  8.1* 8.3* 8.4*  PHOS 5.1* 4.5 5.4* 5.9*  --    Liver Function Tests:  Recent Labs Lab 09/02/16 1049 09/03/16 0622 09/04/16 0526 09/08/16 1259  AST  --   --   --  163*  ALT  --   --   --  103*  ALKPHOS  --   --   --  274*  BILITOT  --   --   --  1.5*  PROT  --   --   --  8.7*  ALBUMIN 1.9* 2.2* 2.2* 2.0*   No results for input(s): LIPASE, AMYLASE in the last 168 hours. No results for input(s): AMMONIA in the last 168 hours. CBC:  Recent Labs  Lab 09/02/16 0344 09/03/16 0627 09/08/16 1259  WBC 19.8* 22.0* 25.1*  NEUTROABS  --   --  20.2*  HGB 8.5* 8.5* 8.6*  HCT 27.1* 27.0* 26.1*  MCV 83.1 82.3 80.3  PLT 710* 668* 759*   Cardiac Enzymes: No results for input(s): CKTOTAL, CKMB, CKMBINDEX, TROPONINI in the last 168 hours.  BNP (last 3 results)  Recent Labs  07/03/16 2143 08/03/16 1119  BNP 37.0 118.6*    ProBNP (last 3 results) No results for input(s): PROBNP in the last 8760 hours.   Serum creatinine: 2.01 mg/dL (H) 09/08/16 1259 Estimated creatinine clearance: 32.2 mL/min (A)  CBG:  Recent Labs Lab 09/03/16 0745 09/03/16 1145 09/03/16 1636 09/03/16 2322 09/04/16 1226  GLUCAP 158* 149* 119* 117* 110*    Radiological Exams on Admission: Dg Knee 1-2 Views Right  Result Date: 09/08/2016 CLINICAL DATA:  Recent BKA.  Signs of infection now. EXAM: RIGHT KNEE - 1-2 VIEW COMPARISON:  Right knee images of March 04, 2014. FINDINGS: The patient has undergone below-the-knee amputation. The surgical bony margins appear sharp. There is soft tissue swelling over the stump. Surgical skin staples are present. No definite gas collections are observed. IMPRESSION: No objective evidence of osteomyelitis is observed. Electronically Signed   By: David  Martinique M.D.   On: 09/08/2016 13:55    EKG: Independently reviewed. No STEMI  Assessment/Plan Active Problems:   Wound infection after surgery  Wound infection Cont IV vanc and zosyn Ortho to see pt, consult by  EDP Blood cx x2 pending  AKI Cr 2.01 Prerenal based on bun/cr Hydrate gently overnight CKD III at baseline Cr ~1.6 normally  CAD Daily aspirin  Malnutrition Continue supplements  BPH Cont flomax  Chronic diarrhea Prn loperamide Holding MiraLAX Checking abdominal film - no large stool burden, gaseous distension seen, would consider CT if any abd pain  Gout Continue: Col[place] bid  Hypothyroidism Cont OP synthroid 100 mcg qd No signs of hyper or hypothyroidism   Hypertension When necessary hydralazine 10 mg IV as needed for severe blood pressure [place] bid  DM SSI Lantus 10u qd  Hyperlipidemia Continue statin  Chronic pain Cont lyrica Hold ultracet   Code Status: FULL DVT Prophylaxis: SCD Family Communication: unavailable  Disposition Plan: Pending Improvement  Status: inpt, tele  Elwin Mocha, MD Family Medicine [facility] [email or web address] Password [email or web address]

## 2016-09-08 NOTE — ED Provider Notes (Signed)
[facility] DEPT Provider Note   CSN: [id number] Arrival date & time: 09/08/16  1159     History   Chief Complaint Chief Complaint  Patient presents with  . Leg Pain    HPI Todd [name] is a 61 y.o. male with a PMHx of DM2, gout, HTN, HLD, hypothyroidism, and recent hospitalization for foot gangrene/cellulitis, underwent L BKA on 08/12/16 and R BKA on 08/17/16, discharged on 08/19/16, who presents to the ED via EMS from the [facility] for concerns of "a green spot" on his right BKA wound as well as labs that were done yesterday that showed a white blood cell count of 30.7, sent here for evaluation of infection of the wound. Patient reports that overnight his right leg developed some purulent drainage, and he has had some more pain in the right leg. He describes the pain as 4/10 constant throbbing nonradiating right stump pain worse with touching the area and mildly improved with Percocet. He denies any fevers, chills, chest pain, shortness breath, abdominal pain, N/V/D/C, urinary complaints, focal weakness, or any other complaints at this time. He had appts with Dr. Sharol Given and his vascular surgeon Dr. Oneida Alar today, but since he was sent here he was unable to see them.    The history is provided by the patient, medical records and the nursing home. No language interpreter was used.  Leg Pain   This is a new problem. The current episode started yesterday. The problem occurs constantly. The problem has not changed since onset.The pain is present in the right lower leg. Quality: throbbing. The pain is at a severity of 4/10. The pain is mild. Exacerbated by: palpation. Treatments tried: percocet. The treatment provided mild relief. Family history is significant for gout.    Past Medical History:  Diagnosis Date  . Diabetes mellitus without complication ([place])   . Goiter   . Gout   . Hyperlipidemia   . Hypertension   . Thyroid disease     Patient Active Problem List   Diagnosis Date Noted   . Respiratory failure ([place])   . Acute on chronic kidney failure ([place]) 08/30/2016  . Pressure injury of skin 08/15/2016  . Gangrene associated with diabetes mellitus ([place]) 08/08/2016  . Diabetic peripheral neuropathy ([place]) 07/21/2016  . Renal failure 07/04/2016  . Gout 07/04/2016  . Elevated troponin 07/04/2016  . Cellulitis feet due to stasis dermatitis & edema 07/04/2016  . Hyperuricemia 06/21/2016  . HTN (hypertension) 01/05/2016  . Diabetes type 2, uncontrolled ([place]) 01/05/2016  . AKI (acute kidney injury) ([place]) 12/20/2015  . Hypothyroidism 05/10/2011    Past Surgical History:  Procedure Laterality Date  . ABDOMINAL AORTOGRAM N/A 08/11/2016   Procedure: Abdominal Aortogram;  Surgeon: Waynetta Sandy, MD;  Location: [facility];  Service: Cardiovascular;  Laterality: N/A;  . ABDOMINAL AORTOGRAM W/LOWER EXTREMITY N/A 08/15/2016   Procedure: Abdominal Aortogram w/Lower Extremity;  Surgeon: Elam Dutch, MD;  Location: [facility];  Service: Cardiovascular;  Laterality: N/A;  . AMPUTATION Left 08/12/2016   Procedure: LEFT BELOW KNEE AMPUTATION;  Surgeon: Newt Minion, MD;  Location: [facility];  Service: Orthopedics;  Laterality: Left;  . AMPUTATION Right 08/17/2016   Procedure: RIGHT BELOW KNEE AMPUTATION;  Surgeon: Elam Dutch, MD;  Location: [facility];  Service: Vascular;  Laterality: Right;  . LOWER EXTREMITY ANGIOGRAPHY Bilateral 08/11/2016   Procedure: Lower Extremity Angiography;  Surgeon: Waynetta Sandy, MD;  Location: [facility];  Service: Cardiovascular;  Laterality:  Bilateral;  . PERIPHERAL VASCULAR BALLOON ANGIOPLASTY Left 08/11/2016   Procedure: Peripheral Vascular Balloon Angioplasty;  Surgeon: Waynetta Sandy, MD;  Location: [facility];  Service: Cardiovascular;  Laterality: Left;  SFA  . THYROID SURGERY         Home Medications    Prior to Admission medications   Medication Sig Start Date End Date Taking?  Authorizing Provider  acetaminophen (TYLENOL) 325 MG tablet Take 2 tablets (650 mg total) by mouth every 6 (six) hours as needed for mild pain (or Fever >/= 101). 08/19/16   Shanker Kristeen Mans, MD  aspirin EC 325 MG EC tablet Take 1 tablet (325 mg total) by mouth daily. 08/20/16   Shanker Kristeen Mans, MD  atorvastatin (LIPITOR) 40 MG tablet Take 1 tablet (40 mg total) by mouth daily at 6 PM. 09/04/16   Patrecia Pour, MD  carvedilol (COREG) 3.125 MG tablet Take 1 tablet (3.125 mg total) by mouth 2 (two) times daily with a meal. 07/06/16   Thurnell Lose, MD  COLCRYS 0.6 MG tablet Take 0.6 mg by mouth 2 (two) times daily. 07/29/16   Historical Provider, MD  feeding supplement, GLUCERNA SHAKE, (GLUCERNA SHAKE) LIQD Take 237 mLs by mouth 2 (two) times daily between meals. Patient not taking: Reported on 08/31/2016 08/19/16   Jonetta Osgood, MD  Insulin Glargine (LANTUS SOLOSTAR) 100 UNIT/ML Solostar Pen Inject 10 Units into the skin daily. 08/19/16   Shanker Kristeen Mans, MD  levothyroxine (SYNTHROID, LEVOTHROID) 100 MCG tablet Take 1 tablet (100 mcg total) by mouth daily before breakfast. 07/07/16   Thurnell Lose, MD  loperamide (IMODIUM) 2 MG capsule Take 4 mg by mouth as needed for diarrhea or loose stools.    Historical Provider, MD  polyethylene glycol (MIRALAX / GLYCOLAX) packet Take 17 g by mouth daily. 08/19/16   Shanker Kristeen Mans, MD  pregabalin (LYRICA) 200 MG capsule Take 1 capsule (200 mg total) by mouth daily. 09/04/16   Patrecia Pour, MD  traMADol-acetaminophen (ULTRACET) 37.5-325 MG tablet Take 1 tablet by mouth every 6 (six) hours as needed. 09/04/16   Patrecia Pour, MD    Family History Family History  Problem Relation Age of Onset  . Heart disease Mother   . Pneumonia Father   . Diabetes Maternal Aunt   . Diabetes Maternal Uncle     Social History Social History  Substance Use Topics  . Smoking status: Current Every Day Smoker    Packs/day: 0.75    Years: 45.00    Types: Cigarettes     Last attempt to quit: 11/12/2014  . Smokeless tobacco: Never Used  . Alcohol use No     Comment: h/o heavy use; stopped drinking in 7/16     Allergies   Lisinopril   Review of Systems Review of Systems  Constitutional: Negative for chills and fever.  Respiratory: Negative for shortness of breath.   Cardiovascular: Negative for chest pain.  Gastrointestinal: Negative for abdominal pain, constipation, diarrhea, nausea and vomiting.  Genitourinary: Negative for dysuria and hematuria.  Musculoskeletal: Positive for arthralgias and myalgias.  Skin: Positive for color change and wound.  Allergic/Immunologic: Positive for immunocompromised state.  Neurological: Negative for weakness.  Psychiatric/Behavioral: Negative for confusion.   10 Systems reviewed and are negative for acute change except as noted in the HPI.   Physical Exam Updated Vital Signs BP 101/74   Pulse 74   Temp 98.3 F (36.8 C) (Oral)   Resp 19  SpO2 99%   Physical Exam  Constitutional: He is oriented to person, place, and time. Vital signs are normal. He appears well-developed and well-nourished.  Non-toxic appearance. No distress.  Afebrile, nontoxic, NAD, rectal temp 99.6 (low grade)  HENT:  Head: Normocephalic and atraumatic.  Mouth/Throat: Oropharynx is clear and moist and mucous membranes are normal.  Eyes: Conjunctivae and EOM are normal. Right eye exhibits no discharge. Left eye exhibits no discharge.  Neck: Normal range of motion. Neck supple.  Cardiovascular: Normal rate, regular rhythm and normal heart sounds.  Exam reveals no gallop and no friction rub.   No murmur heard. Pulmonary/Chest: Effort normal and breath sounds normal. No respiratory distress. He has no decreased breath sounds. He has no wheezes. He has no rhonchi. He has no rales.  Abdominal: Soft. Normal appearance and bowel sounds are normal. He exhibits no distension. There is no tenderness. There is no rigidity, no rebound, no guarding,  no CVA tenderness, no tenderness at McBurney's point and negative Murphy's sign.  Musculoskeletal: Normal range of motion.  s/p bilateral BKA's L BKA wrapped, no drainage evident through gauze R BKA with drainage seeped through gauze, very malodorous purulent drainage; after unwrapping wound, staples found intact, but drainage coming through the surgical incision; entire stump feels fluctuant and warm to touch, distal end of tibia can be seen through a region of somewhat translucent skin superior to the wound. No red streaking. Stump mildly tender, however knee with FROM and without focal tenderness. SEE PICTURE BELOW  Neurological: He is alert and oriented to person, place, and time. He has normal strength. No sensory deficit.  Skin: Skin is warm and dry. No rash noted. There is erythema.  R BKA wound/infection as mentioned above and pictured below  Psychiatric: He has a normal mood and affect.  Nursing note and vitals reviewed.      ED Treatments / Results  Labs (all labs ordered are listed, but only abnormal results are displayed) Labs Reviewed  COMPREHENSIVE METABOLIC PANEL - Abnormal; Notable for the following:       Result Value   Sodium 130 (*)    Potassium 5.3 (*)    Chloride 98 (*)    CO2 18 (*)    BUN 49 (*)    Creatinine, Ser 2.01 (*)    Calcium 8.4 (*)    Total Protein 8.7 (*)    Albumin 2.0 (*)    AST 163 (*)    ALT 103 (*)    Alkaline Phosphatase 274 (*)    Total Bilirubin 1.5 (*)    GFR calc non Af Amer 34 (*)    GFR calc Af Amer 39 (*)    All other components within normal limits  CBC WITH DIFFERENTIAL/PLATELET - Abnormal; Notable for the following:    WBC 25.1 (*)    RBC 3.25 (*)    Hemoglobin 8.6 (*)    HCT 26.1 (*)    Platelets 759 (*)    Neutro Abs 20.2 (*)    Monocytes Absolute 1.3 (*)    All other components within normal limits  URINALYSIS, ROUTINE W REFLEX MICROSCOPIC - Abnormal; Notable for the following:    APPearance HAZY (*)    Protein, ur  30 (*)    Bacteria, UA RARE (*)    Squamous Epithelial / LPF 0-5 (*)    All other components within normal limits  CULTURE, BLOOD (ROUTINE X 2)  CULTURE, BLOOD (ROUTINE X 2)  URINE CULTURE  SEDIMENTATION RATE  C-REACTIVE PROTEIN  I-STAT CG4 LACTIC ACID, ED    EKG  EKG Interpretation None       Radiology Dg Knee 1-2 Views Right  Result Date: 09/08/2016 CLINICAL DATA:  Recent BKA.  Signs of infection now. EXAM: RIGHT KNEE - 1-2 VIEW COMPARISON:  Right knee images of March 04, 2014. FINDINGS: The patient has undergone below-the-knee amputation. The surgical bony margins appear sharp. There is soft tissue swelling over the stump. Surgical skin staples are present. No definite gas collections are observed. IMPRESSION: No objective evidence of osteomyelitis is observed. Electronically Signed   By: David  Martinique M.D.   On: 09/08/2016 13:55    Procedures Procedures (including critical care time)  CRITICAL CARE- sepsis d/t infected/gangrenous R BKA stump Performed by: Reece Agar   Total critical care time: 55 minutes  Critical care time was exclusive of separately billable procedures and treating other patients.  Critical care was necessary to treat or prevent imminent or life-threatening deterioration.  Critical care was time spent personally by me on the following activities: development of treatment plan with patient and/or surrogate as well as nursing, discussions with consultants, evaluation of patient's response to treatment, examination of patient, obtaining history from patient or surrogate, ordering and performing treatments and interventions, ordering and review of laboratory studies, ordering and review of radiographic studies, pulse oximetry and re-evaluation of patient's condition.   Medications Ordered in ED Medications  0.9 %  sodium chloride infusion (1,000 mLs Intravenous New Bag/Given 09/08/16 1327)  vancomycin (VANCOCIN) 1,250 mg in sodium chloride 0.9 %  250 mL IVPB (1,250 mg Intravenous New Bag/Given 09/08/16 1325)  sodium chloride 0.9 % bolus 1,000 mL (1,000 mLs Intravenous New Bag/Given 09/08/16 1325)    And  sodium chloride 0.9 % bolus 1,000 mL (1,000 mLs Intravenous New Bag/Given 09/08/16 1353)  piperacillin-tazobactam (ZOSYN) IVPB 3.375 g (3.375 g Intravenous New Bag/Given 09/08/16 1320)  fentaNYL (SUBLIMAZE) injection 50 mcg (50 mcg Intravenous Given 09/08/16 1320)     Initial Impression / Assessment and Plan / ED Course  I have reviewed the triage vital signs and the nursing notes.  Pertinent labs & imaging results that were available during my care of the patient were reviewed by me and considered in my medical decision making (see chart for details).     61 y.o. male here with drainage and concerning discoloration to R BKA stump, coming in [facility]. Also had labs yesterday that revealed his WBC 30.7 so he was sent here. On exam, R BKA with malodorous purulent drainage from incision, entire stump feels fluctuant, distal end of the bone visible through the skin. Likely gangrenous and infected stump. BP soft. Will get rectal temp, afebrile on initial vitals. Will get labs, cultures, R knee xray, start empiric abx/fluids, and likely admit for probable surgical intervention. Discussed case with my attending Dr. [name] Palau who agrees with plan. Will give fentanyl then reassess shortly.  1:42 PM Rectal temp 99.6. CBC w/diff showing leukocytosis and reactive thrombocytosis, differential pending; code sepsis called. CMP pending. Lactic WNL. U/A with some protein, some squamous, rare bacteria, likely just contaminated specimen. ESR and CRP pending. Xray not yet done, but will proceed with discussing case with Dr. Sharol Given as well as admission.   1:59 PM Dr. Sharol Given returning page, will consult on pt while he's admitted. Awaiting to hear back from [facility].   2:28 PM Awaiting [facility] return page. Of note, Xray of R knee showing no evidence of osteomyelitis  or gas collection in  stump. CMP specimen hemolyzed, shows mildly bumped Cr from prior, AST/ALT mildly elevated (unclear if this is chronic, last LFTs were in the 60-80s) but given lack of abd pain/tenderness, doubt need for emergent work up of this here in the ER; alk phos 274 likely from the recent bone surgery and infection. ESR/CRP pending. Will continue to await [facility] return page for admission.  2:45 PM Dr. Aggie Moats of [facility] returning page and will admit. Holding orders to be placed by admitting team. Please see their notes for further documentation of care. I appreciate their help with this pleasant pt's care. Pt stable at time of admission.   Final Clinical Impressions(s) / ED Diagnoses   Final diagnoses:  Right leg pain  Sepsis, due to unspecified organism [facility])  Deep incisional surgical site infection, initial encounter  Leukocytosis, unspecified type  Reactive thrombocytosis  AKI (acute kidney injury) ([place])  Elevated LFTs    New Prescriptions New Prescriptions   No medications on file     [address], PA-C 09/08/16 [place], MD 09/08/16 [phone]

## 2016-09-08 NOTE — ED Triage Notes (Signed)
Pt arrives from [facility] in [place] where he has been since his recent amputation of his right leg. Staff states they found a green spot on his leg and wanted him to be evaluated for gangrene.

## 2016-09-08 NOTE — Telephone Encounter (Signed)
Glory Buff NP called, from [facility], said that pt was supposed to f/u in office today, post BKA.  However pt has a white count 30,000, so they are sending him to [facility] ER.

## 2016-09-09 ENCOUNTER — Inpatient Hospital Stay (HOSPITAL_COMMUNITY): Payer: BLUE CROSS/BLUE SHIELD

## 2016-09-09 ENCOUNTER — Encounter (HOSPITAL_COMMUNITY): Payer: Self-pay | Admitting: General Practice

## 2016-09-09 ENCOUNTER — Ambulatory Visit: Payer: BLUE CROSS/BLUE SHIELD | Admitting: Family Medicine

## 2016-09-09 DIAGNOSIS — M109 Gout, unspecified: Secondary | ICD-10-CM | POA: Diagnosis not present

## 2016-09-09 DIAGNOSIS — L03818 Cellulitis of other sites: Secondary | ICD-10-CM | POA: Diagnosis not present

## 2016-09-09 DIAGNOSIS — I129 Hypertensive chronic kidney disease with stage 1 through stage 4 chronic kidney disease, or unspecified chronic kidney disease: Secondary | ICD-10-CM | POA: Diagnosis not present

## 2016-09-09 DIAGNOSIS — E1122 Type 2 diabetes mellitus with diabetic chronic kidney disease: Secondary | ICD-10-CM

## 2016-09-09 LAB — CBC
HCT: 22.9 % — ABNORMAL LOW (ref 39.0–52.0)
Hemoglobin: 7.4 g/dL — ABNORMAL LOW (ref 13.0–17.0)
MCH: 26 pg (ref 26.0–34.0)
MCHC: 32.3 g/dL (ref 30.0–36.0)
MCV: 80.4 fL (ref 78.0–100.0)
PLATELETS: 775 10*3/uL — AB (ref 150–400)
RBC: 2.85 MIL/uL — AB (ref 4.22–5.81)
RDW: 14.4 % (ref 11.5–15.5)
WBC: 22.5 10*3/uL — AB (ref 4.0–10.5)

## 2016-09-09 LAB — URINE CULTURE: Culture: NO GROWTH

## 2016-09-09 LAB — MAGNESIUM: MAGNESIUM: 1.8 mg/dL (ref 1.7–2.4)

## 2016-09-09 LAB — C DIFFICILE QUICK SCREEN W PCR REFLEX
C Diff antigen: NEGATIVE
C Diff interpretation: NOT DETECTED
C Diff toxin: NEGATIVE

## 2016-09-09 LAB — BASIC METABOLIC PANEL
Anion gap: 12 (ref 5–15)
BUN: 29 mg/dL — ABNORMAL HIGH (ref 6–20)
CALCIUM: 7.5 mg/dL — AB (ref 8.9–10.3)
CO2: 15 mmol/L — ABNORMAL LOW (ref 22–32)
CREATININE: 1.48 mg/dL — AB (ref 0.61–1.24)
Chloride: 108 mmol/L (ref 101–111)
GFR, EST AFRICAN AMERICAN: 57 mL/min — AB (ref >60)
GFR, EST NON AFRICAN AMERICAN: 49 mL/min — AB (ref >60)
Glucose, Bld: 111 mg/dL — ABNORMAL HIGH (ref 65–99)
POTASSIUM: 4 mmol/L (ref 3.5–5.1)
Sodium: 135 mmol/L (ref 135–145)

## 2016-09-09 LAB — GLUCOSE, CAPILLARY
GLUCOSE-CAPILLARY: 95 mg/dL (ref 65–99)
Glucose-Capillary: 101 mg/dL — ABNORMAL HIGH (ref 65–99)
Glucose-Capillary: 141 mg/dL — ABNORMAL HIGH (ref 65–99)
Glucose-Capillary: 64 mg/dL — ABNORMAL LOW (ref 65–99)

## 2016-09-09 LAB — PROTIME-INR
INR: 1.24
PROTHROMBIN TIME: 15.7 s — AB (ref 11.4–15.2)

## 2016-09-09 LAB — PHOSPHORUS: PHOSPHORUS: 2.9 mg/dL (ref 2.5–4.6)

## 2016-09-09 MED ORDER — MUPIROCIN CALCIUM 2 % EX CREA
TOPICAL_CREAM | Freq: Two times a day (BID) | CUTANEOUS | Status: DC
  Administered 2016-09-10 – 2016-09-15 (×12): via TOPICAL
  Filled 2016-09-09: qty 15

## 2016-09-09 MED ORDER — DIPHENHYDRAMINE HCL 25 MG PO CAPS
25.0000 mg | ORAL_CAPSULE | Freq: Four times a day (QID) | ORAL | Status: DC | PRN
  Administered 2016-09-10 (×2): 25 mg via ORAL
  Filled 2016-09-09 (×2): qty 1

## 2016-09-09 MED ORDER — ENSURE ENLIVE PO LIQD: 237.0000 mL | Freq: Two times a day (BID) | ORAL | Status: DC

## 2016-09-09 MED ORDER — OXYCODONE-ACETAMINOPHEN 5-325 MG PO TABS
1.0000 | ORAL_TABLET | ORAL | Status: DC | PRN
  Administered 2016-09-09 – 2016-09-10 (×2): 2 via ORAL
  Administered 2016-09-10: 1 via ORAL
  Administered 2016-09-10 – 2016-09-11 (×5): 2 via ORAL
  Administered 2016-09-14: 1 via ORAL
  Administered 2016-09-14: 2 via ORAL
  Filled 2016-09-09 (×3): qty 2
  Filled 2016-09-09: qty 1
  Filled 2016-09-09: qty 2
  Filled 2016-09-09: qty 1
  Filled 2016-09-09 (×4): qty 2

## 2016-09-09 NOTE — Progress Notes (Signed)
Hypoglycemic Event  CBG: 64  Treatment: 15 GM carbohydrate snack  Symptoms: Hungry  Follow-up CBG: Time:1800 CBG Result:104  Possible Reasons for Event: Inadequate meal intake  Comments/MD notified:no    Amanda Cockayne, Martin Majestic

## 2016-09-09 NOTE — Progress Notes (Signed)
   Daily Progress Note  Pt is POD #23 s/p R BKA by Dr. Oneida Alar.  Reported pt with purulent drainage from R BKA.  On exam, pt has severe contracture in R BKA with ischemic anterior skin in BKA with some pus draining from edges of eschar.  - R BKA is functionally useless and ischemic  - I recommended R AKA. - I have never met this pt prior today, but he does not appear to be able to carry on a conversation currently.  Unclear if this is AMS from sepsis or chronic dementia. - Let me know when pt or family is willing to proceed with R AKA.  Adele Barthel, MD, FACS [facility] Office: [phone] Pager: [phone]  09/09/2016, 10:25 AM

## 2016-09-09 NOTE — Progress Notes (Signed)
Patient ID: Todd [name], male   DOB: 1955/12/07, 61 y.o.   MRN: [id number]   PROGRESS NOTE  Polo Mcmartin  ATF:[id number] DOB: [birth year] DOA: 09/08/2016  PCP: [name], MD   Brief Narrative:  61 y.o. male with known gout, hypertension, hyperlipidemia and recent hospitalization with right BKA. He presented with bilateral leg pain. States the pain has been worsening throbbing for last 3 days. Rates pain as 4-10. Has been taking outpatient narcotics with little to no effect.   Assessment & Plan:   Sepsis secondary to Wound infection R BKA - pt met criteria for sepsis on admission, T 101.5, WBC 25, HR 90 - pt reported some purulent drainage from R BKA - pt is currently post op day #23 s/p R BKA  - vascular surgery consulted and recommends R AKA - pt to discuss with family if ready for another surgery  - continue vanc and zosyn day #2 - follow up on blood cultures   CKD stage III - about 8 days ago Cr was as high as 2.1 and on admission Cr 2 - now trending down - BMP in AM  Chronic systolic dysfunction - Echocardiogram 3/23 read as EF 45-50% with basal midinferior myocardial hypokinesis.  - There is no evidence of ACS with unchanged ECG, no angina. - stop IVF to avoid volume overload   Transaminitis - unclear etiology - RUQ pending  - CMET in AM  CAD - Daily aspirin  Malnutrition, moderate  - nutritionist consulted   BPH - Cont flomax  Chronic diarrhea - check stool panel, C. Diff   Gout - Continue: Colcrys bid  Hypothyroidism - continue synthroid  Hyperkalemia - resolved   Anemia of chronic disease  - drop in Hg noted since admission but no evidence of active bleeding  - CBC in AM  Thrombocytosis - reactive - CBC in AM   Hypertension, essential  - continue Coreg  - reasonable control for now   DM type II with complications of nephropathy and PVD - continue lantus and SSI   Hyperlipidemia - Continue statin  DVT prophylaxis:  Heparin SQ Code Status: Full  Family Communication: Patient at bedside  Disposition Plan: to be determined   Consultants:   Vascular surgery   Procedures:   None  Antimicrobials:   Vancomycin 3/29 --?  Zosyn 3/29 -->   Subjective: No events overnight.   Objective: Vitals:   09/08/16 2102 09/08/16 2337 09/09/16 0650 09/09/16 0651  BP: 107/73  (!) 102/45   Pulse: 82 90 87   Resp: 18  16   Temp:   (!) 101.5 F (38.6 C) 98.9 F (37.2 C)  TempSrc:   Oral Axillary  SpO2: 100%  98%     Intake/Output Summary (Last 24 hours) at 09/09/16 1134 Last data filed at 09/09/16 2706  Gross per 24 hour  Intake              590 ml  Output             1400 ml  Net             -810 ml   Examination:  General exam: Appears calm and comfortable  Respiratory system: Clear to auscultation. Respiratory effort normal. Cardiovascular system: S1 & S2 heard, RRR. No JVD, murmurs, rubs, gallops or clicks. No pedal edema. Gastrointestinal system: Abdomen is nondistended, soft and nontender. No organomegaly or masses felt.. Central nervous system: Alert and oriented.  Extremities: R BKA stump   Data Reviewed: I  have personally reviewed following labs and imaging studies  CBC:  Recent Labs Lab 09/03/16 0627 09/08/16 1259 09/08/16 1911 09/09/16 0418  WBC 22.0* 25.1* 24.3* 22.5*  NEUTROABS  --  20.2*  --   --   HGB 8.5* 8.6* 7.9* 7.4*  HCT 27.0* 26.1* 25.1* 22.9*  MCV 82.3 80.3 81.2 80.4  PLT 668* 759* 858* 646*   Basic Metabolic Panel:  Recent Labs Lab 09/03/16 0622 09/04/16 0526 09/08/16 1259 09/08/16 1911 09/09/16 0418  NA 134* 134* 130*  --  135  K 3.7 3.6 5.3*  --  4.0  CL 101 98* 98*  --  108  CO2 21* 21* 18*  --  15*  GLUCOSE 101* 113* 94  --  111*  BUN 23* 25* 49*  --  29*  CREATININE 1.55* 1.65* 2.01* 1.74* 1.48*  CALCIUM 8.1* 8.3* 8.4*  --  7.5*  MG  --   --   --   --  1.8  PHOS 5.4* 5.9*  --   --  2.9   Liver Function Tests:  Recent Labs Lab  09/03/16 0622 09/04/16 0526 09/08/16 1259  AST  --   --  163*  ALT  --   --  103*  ALKPHOS  --   --  274*  BILITOT  --   --  1.5*  PROT  --   --  8.7*  ALBUMIN 2.2* 2.2* 2.0*   Coagulation Profile:  Recent Labs Lab 09/09/16 0418  INR 1.24   CBG:  Recent Labs Lab 09/03/16 2322 09/04/16 1226 09/08/16 2219 09/09/16 0655 09/09/16 1113  GLUCAP 117* 110* 104* 101* 141*   Urine analysis:    Component Value Date/Time   COLORURINE YELLOW 09/08/2016 1306   APPEARANCEUR HAZY (A) 09/08/2016 1306   APPEARANCEUR Clear 08/05/2016 1630   LABSPEC 1.019 09/08/2016 1306   PHURINE 5.0 09/08/2016 1306   GLUCOSEU NEGATIVE 09/08/2016 1306   HGBUR NEGATIVE 09/08/2016 1306   BILIRUBINUR NEGATIVE 09/08/2016 1306   BILIRUBINUR Negative 08/05/2016 [place] 09/08/2016 1306   PROTEINUR 30 (A) 09/08/2016 1306   NITRITE NEGATIVE 09/08/2016 1306   LEUKOCYTESUR NEGATIVE 09/08/2016 1306   LEUKOCYTESUR Negative 08/05/2016 1630   Recent Results (from the past 240 hour(s))  MRSA PCR Screening     Status: Abnormal   Collection Time: 08/30/16  9:49 PM  Result Value Ref Range Status   MRSA by PCR POSITIVE (A) NEGATIVE Final    Comment:        The GeneXpert MRSA Assay (FDA approved for NASAL specimens only), is one component of a comprehensive MRSA colonization surveillance program. It is not intended to diagnose MRSA infection nor to guide or monitor treatment for MRSA infections. RESULT CALLED TO, READ BACK BY AND VERIFIED WITH: L WALSH,RN @0041  08/31/16 MKELLY,MLT   Culture, blood (routine x 2)     Status: None   Collection Time: 08/30/16  9:55 PM  Result Value Ref Range Status   Specimen Description BLOOD RIGHT ANTECUBITAL  Final   Special Requests BOTTLES DRAWN AEROBIC AND ANAEROBIC 5CC EACH  Final   Culture NO GROWTH 5 DAYS  Final   Report Status 09/04/2016 FINAL  Final  Culture, blood (routine x 2)     Status: None   Collection Time: 08/30/16  9:55 PM  Result  Value Ref Range Status   Specimen Description BLOOD LEFT HAND  Final   Special Requests BOTTLES DRAWN AEROBIC AND ANAEROBIC [facility] EACH  Final   Culture  NO GROWTH 5 DAYS  Final   Report Status 09/04/2016 FINAL  Final  Blood Culture (routine x 2)     Status: None (Preliminary result)   Collection Time: 09/08/16  1:15 PM  Result Value Ref Range Status   Specimen Description BLOOD RIGHT HAND  Final   Special Requests BOTTLES DRAWN AEROBIC AND ANAEROBIC 5CC  Final   Culture NO GROWTH <12 HOURS  Final   Report Status PENDING  Incomplete  Blood Culture (routine x 2)     Status: None (Preliminary result)   Collection Time: 09/08/16  1:15 PM  Result Value Ref Range Status   Specimen Description BLOOD RIGHT FOREARM  Final   Special Requests BOTTLES DRAWN AEROBIC AND ANAEROBIC 5CC  Final   Culture NO GROWTH <12 HOURS  Final   Report Status PENDING  Incomplete    Radiology Studies: Dg Knee 1-2 Views Right  Result Date: 09/08/2016 CLINICAL DATA:  Recent BKA.  Signs of infection now. EXAM: RIGHT KNEE - 1-2 VIEW COMPARISON:  Right knee images of March 04, 2014. FINDINGS: The patient has undergone below-the-knee amputation. The surgical bony margins appear sharp. There is soft tissue swelling over the stump. Surgical skin staples are present. No definite gas collections are observed. IMPRESSION: No objective evidence of osteomyelitis is observed. Electronically Signed   By: David  Martinique M.D.   On: 09/08/2016 13:55   Dg Abd 1 View  Result Date: 09/08/2016 CLINICAL DATA:  Constipation and diarrhea for several days EXAM: ABDOMEN - 1 VIEW COMPARISON:  None. FINDINGS: No abnormal stool retention. There is a moderate volume of colonic gas mainly seen from the ascending segment and distal transverse segment. No evidence of small bowel obstruction. Atherosclerotic calcification. No concerning mass effect. Density over the right flank at the level of L3-4 is indeterminate on this single film. Lower lumbar facet  arthropathy. Prominent atherosclerosis. IMPRESSION: 1. Nonobstructive bowel gas pattern. Moderate colonic gas. No abnormal stool retention. 2. Prominent atherosclerotic calcification. Electronically Signed   By: Monte Fantasia M.D.   On: 09/08/2016 15:36   Scheduled Meds: . aspirin  325 mg Oral Daily  . atorvastatin  40 mg Oral q1800  . carvedilol  3.125 mg Oral BID WC  . colchicine  0.6 mg Oral BID  . feeding supplement (GLUCERNA SHAKE)  237 mL Oral BID BM  . heparin  5,000 Units Subcutaneous Q8H  . insulin aspart  0-15 Units Subcutaneous TID WC  . insulin aspart  4 Units Subcutaneous TID WC  . insulin glargine  10 Units Subcutaneous Daily  . levothyroxine  100 mcg Oral QAC breakfast  . piperacillin-tazobactam (ZOSYN)  IV  3.375 g Intravenous Q8H  . pregabalin  200 mg Oral Daily  . tamsulosin  0.4 mg Oral Daily  . vancomycin  750 mg Intravenous Q24H   Continuous Infusions: . sodium chloride 1,000 mL (09/08/16 2340)     LOS: 1 day   Time spent: 20 minutes   Faye Ramsay, MD [facility] Pager [phone]  If 7PM-7AM, please contact night-coverage [email or web address] Password [facility] 09/09/2016, 11:34 AM

## 2016-09-09 NOTE — Progress Notes (Signed)
Initial Nutrition Assessment  DOCUMENTATION CODES:   Not applicable  INTERVENTION:   Glucerna BID between meals. Each supplement provides 220 kcal and 10 grams of protein.  NUTRITION DIAGNOSIS:   Increased nutrient needs related to wound healing as evidenced by estimated needs.  GOAL:   Patient will meet greater than or equal to 90% of their needs  MONITOR:   PO intake, Supplement acceptance, I & O's, Labs, Weight trends  REASON FOR ASSESSMENT:   Consult Assessment of nutrition requirement/status  ASSESSMENT:   61 y.o. male with known gout, hypertension, hyperlipidemia, DM, and recent hospitalization with right BKA. He presented with bilateral leg pain.  Per chart, patient has has a 23 lb weight loss, 15.03% over the past month (likely due to left and right BKA). This is severe weight loss for this time frame.  Patient was unavailable at time of exam. Per chart, patient consumed 0% of lunch.   Recommend Glucerna BID between meals to provide patient with extra protein and calories for healing.  Unable to provide nutrition focused physical exam- patient was not in room.  Meds Review: Lipitor, Novolog, Lantus, Synthroid   Labs Review: CBG 141 (H), Creatinine 1.48 (H), Calcium 7.5 (L)  Diet Order:  Diet heart healthy/carb modified Room service appropriate? Yes; Fluid consistency: Thin  Skin:    Rt/left leg amputee, Stage  II Buttocks  Last BM:  3/29  Height:   Ht Readings from Last 1 Encounters:  09/03/16 5\' 8"  (1.727 m)    Weight:   Wt Readings from Last 1 Encounters:  09/04/16 130 lb 1.1 oz (59 kg)    Ideal Body Weight:  60.9 kg  BMI:  There is no height or weight on file to calculate BMI.  Estimated Nutritional Needs:   Kcal:  1700-1900  Protein:  85-100 grams  Fluid:  1.7-1.9 L/day  EDUCATION NEEDS:   No education needs identified at this time  Juliann Pulse M.S. Nutrition Dietetic Intern

## 2016-09-10 DIAGNOSIS — D72829 Elevated white blood cell count, unspecified: Secondary | ICD-10-CM

## 2016-09-10 DIAGNOSIS — T814XXA Infection following a procedure, initial encounter: Secondary | ICD-10-CM

## 2016-09-10 LAB — COMPREHENSIVE METABOLIC PANEL
ALT: 59 U/L (ref 17–63)
AST: 58 U/L — ABNORMAL HIGH (ref 15–41)
Albumin: 1.6 g/dL — ABNORMAL LOW (ref 3.5–5.0)
Alkaline Phosphatase: 263 U/L — ABNORMAL HIGH (ref 38–126)
Anion gap: 10 (ref 5–15)
BILIRUBIN TOTAL: 0.6 mg/dL (ref 0.3–1.2)
BUN: 17 mg/dL (ref 6–20)
CO2: 17 mmol/L — ABNORMAL LOW (ref 22–32)
Calcium: 7.6 mg/dL — ABNORMAL LOW (ref 8.9–10.3)
Chloride: 108 mmol/L (ref 101–111)
Creatinine, Ser: 1.33 mg/dL — ABNORMAL HIGH (ref 0.61–1.24)
GFR calc Af Amer: >60 mL/min (ref >60)
GFR calc non Af Amer: 56 mL/min — ABNORMAL LOW (ref >60)
Glucose, Bld: 69 mg/dL (ref 65–99)
POTASSIUM: 3.8 mmol/L (ref 3.5–5.1)
Sodium: 135 mmol/L (ref 135–145)
TOTAL PROTEIN: 7.2 g/dL (ref 6.5–8.1)

## 2016-09-10 LAB — CBC
HEMATOCRIT: 24.2 % — AB (ref 39.0–52.0)
HEMOGLOBIN: 7.7 g/dL — AB (ref 13.0–17.0)
MCH: 25.8 pg — ABNORMAL LOW (ref 26.0–34.0)
MCHC: 31.8 g/dL (ref 30.0–36.0)
MCV: 81.2 fL (ref 78.0–100.0)
Platelets: 847 10*3/uL — ABNORMAL HIGH (ref 150–400)
RBC: 2.98 MIL/uL — ABNORMAL LOW (ref 4.22–5.81)
RDW: 14.5 % (ref 11.5–15.5)
WBC: 24.8 10*3/uL — ABNORMAL HIGH (ref 4.0–10.5)

## 2016-09-10 LAB — GASTROINTESTINAL PANEL BY PCR, STOOL (REPLACES STOOL CULTURE)
ADENOVIRUS F40/41: NOT DETECTED
Astrovirus: NOT DETECTED
CRYPTOSPORIDIUM: NOT DETECTED
Campylobacter species: NOT DETECTED
Cyclospora cayetanensis: NOT DETECTED
ENTEROAGGREGATIVE E COLI (EAEC): NOT DETECTED
ENTEROPATHOGENIC E COLI (EPEC): NOT DETECTED
Entamoeba histolytica: NOT DETECTED
Enterotoxigenic E coli (ETEC): NOT DETECTED
GIARDIA LAMBLIA: NOT DETECTED
Norovirus GI/GII: NOT DETECTED
Plesimonas shigelloides: NOT DETECTED
ROTAVIRUS A: NOT DETECTED
Salmonella species: NOT DETECTED
Sapovirus (I, II, IV, and V): NOT DETECTED
Shiga like toxin producing E coli (STEC): NOT DETECTED
Shigella/Enteroinvasive E coli (EIEC): NOT DETECTED
Vibrio cholerae: NOT DETECTED
Vibrio species: NOT DETECTED
YERSINIA ENTEROCOLITICA: NOT DETECTED

## 2016-09-10 LAB — GLUCOSE, CAPILLARY
GLUCOSE-CAPILLARY: 88 mg/dL (ref 65–99)
GLUCOSE-CAPILLARY: 73 mg/dL (ref 65–99)
GLUCOSE-CAPILLARY: 50 mg/dL — AB (ref 65–99)
GLUCOSE-CAPILLARY: 73 mg/dL (ref 65–99)
Glucose-Capillary: 104 mg/dL — ABNORMAL HIGH (ref 65–99)
Glucose-Capillary: 87 mg/dL (ref 65–99)
Glucose-Capillary: 66 mg/dL (ref 65–99)

## 2016-09-10 LAB — MRSA PCR SCREENING: MRSA BY PCR: POSITIVE — AB

## 2016-09-10 NOTE — Progress Notes (Signed)
Patient ID: Todd [name], male   DOB: 1955/09/26, 61 y.o.   MRN: [id number]   PROGRESS NOTE  Todd [name]  OAC:[id number] DOB: 1955/11/27 DOA: 09/08/2016  PCP: [name], MD   Brief Narrative:  61 y.o. male with known gout, hypertension, hyperlipidemia and recent hospitalization with right BKA. He presented with bilateral leg pain. States the pain has been worsening throbbing for last 3 days. Rates pain as 4-10. Has been taking outpatient narcotics with little to no effect.   Assessment & Plan:   Sepsis secondary to Wound infection R BKA - pt met criteria for sepsis on admission, T 101.5, WBC 25, HR 90 - pt reported some purulent drainage from R BKA - pt is currently post op day #24 s/p R BKA  - vascular surgery consulted and recommends R AKA - family wants to discuss this with VVS - continue vanc and zosyn day #3 - follow up on blood cultures   CKD stage III - about 8 days ago Cr was as high as 2.1 and on admission Cr 2 - Cr continues to trend down  - BMP in AM  Chronic systolic dysfunction - Echocardiogram 3/23 read as EF 45-50% with basal midinferior myocardial hypokinesis.  - There is no evidence of ACS with unchanged ECG, no angina.  Transaminitis - unclear etiology - RUQ with no acute findings  - LFT's trending down  - CMET in AM  CAD - Daily aspirin  Malnutrition, moderate  - nutritionist consulted   BPH - Cont flomax  Chronic diarrhea - check stool panel, C. Diff negative  Gout - Continue: Colcrys bid  Hypothyroidism - continue synthroid  Hyperkalemia - resolved   Anemia of chronic disease  - drop in Hg noted since admission but no evidence of active bleeding  - CBC in AM  Foley cath in place - seems to have some sediment deposition, not draining  - asked RN to replace or do In/Out cath   Penile infection - pus noted around  - current ABX should be adequate   Thrombocytosis - reactive - CBC in AM   Hypertension, essential   - continue Coreg  - reasonable control for now   DM type II with complications of nephropathy and PVD - continue lantus and SSI   Hyperlipidemia - Continue statin  DVT prophylaxis: Heparin SQ Code Status: Full  Family Communication: Patient at bedside, family at bedside  Disposition Plan: to be determined   Consultants:   Vascular surgery   Procedures:   None  Antimicrobials:   Vancomycin 3/29 -->  Zosyn 3/29 -->   Subjective: No events overnight.   Objective: Vitals:   09/09/16 0651 09/09/16 1453 09/09/16 1941 09/10/16 0604  BP:  108/60 94/64 137/79  Pulse:  78 79 80  Resp:  16 16   Temp: 98.9 F (37.2 C) 98.9 F (37.2 C) 98.9 F (37.2 C) 98.3 F (36.8 C)  TempSrc: Axillary Oral Oral Oral  SpO2:  100% 99% 99%    Intake/Output Summary (Last 24 hours) at 09/10/16 1307 Last data filed at 09/10/16 0109  Gross per 24 hour  Intake              710 ml  Output                0 ml  Net              710 ml   Examination:  General exam: Appears calm and comfortable  Respiratory system: Clear to  auscultation. Respiratory effort normal. Cardiovascular system: S1 & S2 heard, RRR. No JVD, rubs, gallops or clicks. No pedal edema. Gastrointestinal system: Abdomen is nondistended, soft and nontender. No organomegaly or masses felt.. Central nervous system: Alert and oriented.  Extremities: R BKA stump   Data Reviewed: I have personally reviewed following labs and imaging studies  CBC:  Recent Labs Lab 09/08/16 1259 09/08/16 1911 09/09/16 0418 09/10/16 0345  WBC 25.1* 24.3* 22.5* 24.8*  NEUTROABS 20.2*  --   --   --   HGB 8.6* 7.9* 7.4* 7.7*  HCT 26.1* 25.1* 22.9* 24.2*  MCV 80.3 81.2 80.4 81.2  PLT 759* 858* 775* 540*   Basic Metabolic Panel:  Recent Labs Lab 09/04/16 0526 09/08/16 1259 09/08/16 1911 09/09/16 0418 09/10/16 0345  NA 134* 130*  --  135 135  K 3.6 5.3*  --  4.0 3.8  CL 98* 98*  --  108 108  CO2 21* 18*  --  15* 17*  GLUCOSE  113* 94  --  111* 69  BUN 25* 49*  --  29* 17  CREATININE 1.65* 2.01* 1.74* 1.48* 1.33*  CALCIUM 8.3* 8.4*  --  7.5* 7.6*  MG  --   --   --  1.8  --   PHOS 5.9*  --   --  2.9  --    Liver Function Tests:  Recent Labs Lab 09/04/16 0526 09/08/16 1259 09/10/16 0345  AST  --  163* 58*  ALT  --  103* 59  ALKPHOS  --  274* 263*  BILITOT  --  1.5* 0.6  PROT  --  8.7* 7.2  ALBUMIN 2.2* 2.0* 1.6*   Coagulation Profile:  Recent Labs Lab 09/09/16 0418  INR 1.24   CBG:  Recent Labs Lab 09/09/16 1718 09/09/16 1902 09/09/16 2125 09/10/16 0635 09/10/16 1030  GLUCAP 64* 104* 95 88 87   Urine analysis:    Component Value Date/Time   COLORURINE YELLOW 09/08/2016 1306   APPEARANCEUR HAZY (A) 09/08/2016 1306   APPEARANCEUR Clear 08/05/2016 1630   LABSPEC 1.019 09/08/2016 1306   PHURINE 5.0 09/08/2016 1306   GLUCOSEU NEGATIVE 09/08/2016 1306   HGBUR NEGATIVE 09/08/2016 1306   BILIRUBINUR NEGATIVE 09/08/2016 1306   BILIRUBINUR Negative 08/05/2016 [place] 09/08/2016 1306   PROTEINUR 30 (A) 09/08/2016 1306   NITRITE NEGATIVE 09/08/2016 1306   LEUKOCYTESUR NEGATIVE 09/08/2016 1306   LEUKOCYTESUR Negative 08/05/2016 1630   Recent Results (from the past 240 hour(s))  Urine culture     Status: None   Collection Time: 09/08/16 12:59 PM  Result Value Ref Range Status   Specimen Description URINE, CATHETERIZED  Final   Special Requests NONE  Final   Culture NO GROWTH  Final   Report Status 09/09/2016 FINAL  Final  Blood Culture (routine x 2)     Status: None (Preliminary result)   Collection Time: 09/08/16  1:15 PM  Result Value Ref Range Status   Specimen Description BLOOD RIGHT HAND  Final   Special Requests BOTTLES DRAWN AEROBIC AND ANAEROBIC 5CC  Final   Culture NO GROWTH 1 DAY  Final   Report Status PENDING  Incomplete  Blood Culture (routine x 2)     Status: None (Preliminary result)   Collection Time: 09/08/16  1:15 PM  Result Value Ref Range Status    Specimen Description BLOOD RIGHT FOREARM  Final   Special Requests BOTTLES DRAWN AEROBIC AND ANAEROBIC 5CC  Final   Culture NO GROWTH  1 DAY  Final   Report Status PENDING  Incomplete  C difficile quick scan w PCR reflex     Status: None   Collection Time: 09/09/16  2:01 PM  Result Value Ref Range Status   C Diff antigen NEGATIVE NEGATIVE Final   C Diff toxin NEGATIVE NEGATIVE Final   C Diff interpretation No C. difficile detected.  Final    Radiology Studies: Dg Knee 1-2 Views Right  Result Date: 09/08/2016 CLINICAL DATA:  Recent BKA.  Signs of infection now. EXAM: RIGHT KNEE - 1-2 VIEW COMPARISON:  Right knee images of March 04, 2014. FINDINGS: The patient has undergone below-the-knee amputation. The surgical bony margins appear sharp. There is soft tissue swelling over the stump. Surgical skin staples are present. No definite gas collections are observed. IMPRESSION: No objective evidence of osteomyelitis is observed. Electronically Signed   By: David  Martinique M.D.   On: 09/08/2016 13:55   Dg Abd 1 View  Result Date: 09/08/2016 CLINICAL DATA:  Constipation and diarrhea for several days EXAM: ABDOMEN - 1 VIEW COMPARISON:  None. FINDINGS: No abnormal stool retention. There is a moderate volume of colonic gas mainly seen from the ascending segment and distal transverse segment. No evidence of small bowel obstruction. Atherosclerotic calcification. No concerning mass effect. Density over the right flank at the level of L3-4 is indeterminate on this single film. Lower lumbar facet arthropathy. Prominent atherosclerosis. IMPRESSION: 1. Nonobstructive bowel gas pattern. Moderate colonic gas. No abnormal stool retention. 2. Prominent atherosclerotic calcification. Electronically Signed   By: Monte Fantasia M.D.   On: 09/08/2016 15:36   US Abdomen Limited Ruq  Result Date: 09/09/2016 CLINICAL DATA:  Transaminitis EXAM: US ABDOMEN LIMITED - RIGHT UPPER QUADRANT COMPARISON:  None. FINDINGS:  Gallbladder: No gallstones or wall thickening visualized. No sonographic Murphy sign noted by sonographer. Common bile duct: Diameter: 4.2 mm Liver: Increased parenchymal echogenicity. No mass or focal lesion. Liver normal in overall size. IMPRESSION: 1. No acute findings.  Normal gallbladder.  No bile duct dilation. 2. Increased liver parenchymal echogenicity consistent with hepatic steatosis. Electronically Signed   By: Lajean Manes M.D.   On: 09/09/2016 16:31   Scheduled Meds: . aspirin  325 mg Oral Daily  . atorvastatin  40 mg Oral q1800  . carvedilol  3.125 mg Oral BID WC  . colchicine  0.6 mg Oral BID  . feeding supplement (ENSURE ENLIVE)  237 mL Oral BID BM  . feeding supplement (GLUCERNA SHAKE)  237 mL Oral BID BM  . heparin  5,000 Units Subcutaneous Q8H  . insulin aspart  0-15 Units Subcutaneous TID WC  . insulin aspart  4 Units Subcutaneous TID WC  . insulin glargine  10 Units Subcutaneous Daily  . levothyroxine  100 mcg Oral QAC breakfast  . mupirocin cream   Topical BID  . piperacillin-tazobactam (ZOSYN)  IV  3.375 g Intravenous Q8H  . pregabalin  200 mg Oral Daily  . tamsulosin  0.4 mg Oral Daily  . vancomycin  750 mg Intravenous Q24H   Continuous Infusions:    LOS: 2 days   Time spent: 20 minutes   Faye Ramsay, MD [facility] Pager [phone]  If 7PM-7AM, please contact night-coverage [email or web address] Password [email or web address] 09/10/2016, 1:07 PM

## 2016-09-11 LAB — BASIC METABOLIC PANEL
Anion gap: 10 (ref 5–15)
BUN: 15 mg/dL (ref 6–20)
CALCIUM: 7.9 mg/dL — AB (ref 8.9–10.3)
CHLORIDE: 104 mmol/L (ref 101–111)
CO2: 19 mmol/L — ABNORMAL LOW (ref 22–32)
Creatinine, Ser: 1.59 mg/dL — ABNORMAL HIGH (ref 0.61–1.24)
GFR calc non Af Amer: 45 mL/min — ABNORMAL LOW (ref >60)
GFR, EST AFRICAN AMERICAN: 52 mL/min — AB (ref >60)
GLUCOSE: 108 mg/dL — AB (ref 65–99)
Potassium: 4.1 mmol/L (ref 3.5–5.1)
Sodium: 133 mmol/L — ABNORMAL LOW (ref 135–145)

## 2016-09-11 LAB — CBC
HEMATOCRIT: 22.8 % — AB (ref 39.0–52.0)
Hemoglobin: 7.2 g/dL — ABNORMAL LOW (ref 13.0–17.0)
MCH: 25.7 pg — ABNORMAL LOW (ref 26.0–34.0)
MCHC: 31.6 g/dL (ref 30.0–36.0)
MCV: 81.4 fL (ref 78.0–100.0)
Platelets: 890 10*3/uL — ABNORMAL HIGH (ref 150–400)
RBC: 2.8 MIL/uL — ABNORMAL LOW (ref 4.22–5.81)
RDW: 14.9 % (ref 11.5–15.5)
WBC: 30.6 10*3/uL — ABNORMAL HIGH (ref 4.0–10.5)

## 2016-09-11 LAB — GLUCOSE, CAPILLARY: GLUCOSE-CAPILLARY: 98 mg/dL (ref 65–99)

## 2016-09-11 NOTE — Progress Notes (Signed)
Pharmacy Antibiotic Note  Todd [name] is a 61 y.o. male admitted on 09/08/2016 with sepsis.  Pharmacy has been consulted for vancomycin/zosyn dosing.  Day #4 of broad spectrum abx for sepsis. Does have wound infection s/p recent surgery. Vascular recommending R BKA. Family and patient willing to proceed, but surgery not scheduled yet. Now afebrile, WBC elevated and trending up to 30.6.  Plan: Continue Zosyn 3.375 gm IV q8h (4 hour infusion) Continue vancomycin 750mg  IV Q24h  Monitor clinical picture, renal function, VT prn F/U C&S, abx deescalation / LOT    Temp (24hrs), Avg:98.3 F (36.8 C), Min:98 F (36.7 C), Max:98.6 F (37 C)   Recent Labs Lab 09/08/16 1259 09/08/16 1309 09/08/16 1911 09/09/16 0418 09/10/16 0345 09/11/16 0501  WBC 25.1*  --  24.3* 22.5* 24.8* 30.6*  CREATININE 2.01*  --  1.74* 1.48* 1.33* 1.59*  LATICACIDVEN  --  1.52  --   --   --   --     Estimated Creatinine Clearance: 40.7 mL/min (A) (by C-G formula based on SCr of 1.59 mg/dL (H)).    Allergies  Allergen Reactions  . Lisinopril Other (See Comments)    Hyperkalemia / Renal failure    Elenor Quinones, PharmD, [facility] Clinical Pharmacist Pager [phone] 09/11/2016 9:55 AM

## 2016-09-11 NOTE — Progress Notes (Addendum)
   Daily Progress Note  Answered the pt and family's questions.  They had concerns that a VAC was not used on the R BKA.  I discussed with the family that it would have made no difference given the ischemia that developed in the R BKA.  I also discussed with the family that the complete contracture at the right knee made the R BKA essentially useless.  - Family agrees to proceed with R AKA but would prefer Dr. Sharol Given to complete the amputation so the patient can follow-up at only one office. - Please contact Dr. Sharol Given to complete the R AKA. - Available as needed.   Adele Barthel, MD, FACS [facility] [place] Office: [phone] Pager: [phone]  09/11/2016, 8:17 AM

## 2016-09-11 NOTE — Progress Notes (Signed)
Patient ID: Todd [name], male   DOB: Aug 23, 1955, 61 y.o.   MRN: [id number]   PROGRESS NOTE  Todd [name]  PHX:[id number] DOB: March 25, 1956 DOA: 09/08/2016  PCP: [name], MD   Brief Narrative:  61 y.o. male with known gout, hypertension, hyperlipidemia and recent hospitalization with right BKA. He presented with bilateral leg pain. States the pain has been worsening throbbing for last 3 days. Rates pain as 4-10. Has been taking outpatient narcotics with little to no effect.   Assessment & Plan:   Sepsis secondary to Wound infection R BKA - pt met criteria for sepsis on admission, T 101.5, WBC 25, HR 90 - pt reported some purulent drainage from R BKA - pt is currently post op day #25 s/p R BKA  - vascular surgery consulted and recommends R AKA - family wants to have Dr. Sharol Given on board, I will send him a message  - continue vanc and zosyn day #4 - follow up on blood cultures   CKD stage III - about 8 days ago Cr was as high as 2.1 and on admission Cr 2 - Cr slightly up this AM  - BMP in AM  Chronic systolic dysfunction - Echocardiogram 3/23 read as EF 45-50% with basal midinferior myocardial hypokinesis.  - There is no evidence of ACS with unchanged ECG, no angina.  Transaminitis - unclear etiology - RUQ with no acute findings  - LFT's trending down  - CMET in AM  CAD - Daily aspirin  Malnutrition, moderate  - nutritionist consulted   BPH - Cont flomax  Chronic diarrhea - check stool panel, C. Diff negative - allow imodium as needed   Gout - Continue: Colcrys bid  Hypothyroidism - continue synthroid  Hyperkalemia - resolved   Anemia of chronic disease  - drop in Hg noted since admission but no evidence of active bleeding  - CBC in AM  Foley cath in place - asked RN to replace or do In/Out cath   Penile infection - pus noted around  - current ABX should be adequate   Thrombocytosis - reactive and continues trending up, suspect it will  continue to rise until wound infection resolved  - CBC in AM   Hypertension, essential  - continue Coreg  - reasonable control for now   DM type II with complications of nephropathy and PVD - continue lantus and SSI   Hyperlipidemia - Continue statin  DVT prophylaxis: Heparin SQ Code Status: Full  Family Communication: Patient at bedside, family at bedside  Disposition Plan: to be determined   Consultants:   Vascular surgery   Dr. Sharol Given  Procedures:   None  Antimicrobials:   Vancomycin 3/29 -->  Zosyn 3/29 -->   Subjective: No events overnight except for persistent diarrhea.  Objective: Vitals:   09/10/16 2113 09/10/16 2120 09/11/16 0005 09/11/16 0830  BP: (!) 88/58 115/62 (!) 105/51 116/64  Pulse: 78  84 83  Resp:   18   Temp:   98.2 F (36.8 C) 98 F (36.7 C)  TempSrc:   Oral Oral  SpO2: 97%  100% 100%    Intake/Output Summary (Last 24 hours) at 09/11/16 0957 Last data filed at 09/11/16 0230  Gross per 24 hour  Intake              990 ml  Output             1203 ml  Net             -  213 ml   Examination:  General exam: Appears calm and comfortable  Respiratory system: Clear to auscultation. Respiratory effort normal. Cardiovascular system: S1 & S2 heard, RRR. No rubs, gallops or clicks.  Gastrointestinal system: Abdomen is nondistended, soft and nontender. No organomegaly or masses felt.. Central nervous system: Alert and oriented.  Extremities: R BKA stump   Data Reviewed: I have personally reviewed following labs and imaging studies  CBC:  Recent Labs Lab 09/08/16 1259 09/08/16 1911 09/09/16 0418 09/10/16 0345 09/11/16 0501  WBC 25.1* 24.3* 22.5* 24.8* 30.6*  NEUTROABS 20.2*  --   --   --   --   HGB 8.6* 7.9* 7.4* 7.7* 7.2*  HCT 26.1* 25.1* 22.9* 24.2* 22.8*  MCV 80.3 81.2 80.4 81.2 81.4  PLT 759* 858* 775* 847* 409*   Basic Metabolic Panel:  Recent Labs Lab 09/08/16 1259 09/08/16 1911 09/09/16 0418 09/10/16 0345  09/11/16 0501  NA 130*  --  135 135 133*  K 5.3*  --  4.0 3.8 4.1  CL 98*  --  108 108 104  CO2 18*  --  15* 17* 19*  GLUCOSE 94  --  111* 69 108*  BUN 49*  --  29* 17 15  CREATININE 2.01* 1.74* 1.48* 1.33* 1.59*  CALCIUM 8.4*  --  7.5* 7.6* 7.9*  MG  --   --  1.8  --   --   PHOS  --   --  2.9  --   --    Liver Function Tests:  Recent Labs Lab 09/08/16 1259 09/10/16 0345  AST 163* 58*  ALT 103* 59  ALKPHOS 274* 263*  BILITOT 1.5* 0.6  PROT 8.7* 7.2  ALBUMIN 2.0* 1.6*   Coagulation Profile:  Recent Labs Lab 09/09/16 0418  INR 1.24   CBG:  Recent Labs Lab 09/10/16 1030 09/10/16 1640 09/10/16 2117 09/10/16 2219 09/10/16 2320  GLUCAP 87 73 50* 66 73   Urine analysis:    Component Value Date/Time   COLORURINE YELLOW 09/08/2016 1306   APPEARANCEUR HAZY (A) 09/08/2016 1306   APPEARANCEUR Clear 08/05/2016 1630   LABSPEC 1.019 09/08/2016 1306   PHURINE 5.0 09/08/2016 1306   GLUCOSEU NEGATIVE 09/08/2016 1306   HGBUR NEGATIVE 09/08/2016 1306   BILIRUBINUR NEGATIVE 09/08/2016 1306   BILIRUBINUR Negative 08/05/2016 [place] 09/08/2016 1306   PROTEINUR 30 (A) 09/08/2016 1306   NITRITE NEGATIVE 09/08/2016 1306   LEUKOCYTESUR NEGATIVE 09/08/2016 1306   LEUKOCYTESUR Negative 08/05/2016 1630   Recent Results (from the past 240 hour(s))  Urine culture     Status: None   Collection Time: 09/08/16 12:59 PM  Result Value Ref Range Status   Specimen Description URINE, CATHETERIZED  Final   Special Requests NONE  Final   Culture NO GROWTH  Final   Report Status 09/09/2016 FINAL  Final  Blood Culture (routine x 2)     Status: None (Preliminary result)   Collection Time: 09/08/16  1:15 PM  Result Value Ref Range Status   Specimen Description BLOOD RIGHT HAND  Final   Special Requests BOTTLES DRAWN AEROBIC AND ANAEROBIC 5CC  Final   Culture NO GROWTH 2 DAYS  Final   Report Status PENDING  Incomplete  Blood Culture (routine x 2)     Status: None  (Preliminary result)   Collection Time: 09/08/16  1:15 PM  Result Value Ref Range Status   Specimen Description BLOOD RIGHT FOREARM  Final   Special Requests BOTTLES DRAWN AEROBIC AND ANAEROBIC  5CC  Final   Culture NO GROWTH 2 DAYS  Final   Report Status PENDING  Incomplete  Gastrointestinal Panel by PCR , Stool     Status: None   Collection Time: 09/09/16  2:01 PM  Result Value Ref Range Status   Campylobacter species NOT DETECTED NOT DETECTED Final   Plesimonas shigelloides NOT DETECTED NOT DETECTED Final   Salmonella species NOT DETECTED NOT DETECTED Final   Yersinia enterocolitica NOT DETECTED NOT DETECTED Final   Vibrio species NOT DETECTED NOT DETECTED Final   Vibrio cholerae NOT DETECTED NOT DETECTED Final   Enteroaggregative E coli (EAEC) NOT DETECTED NOT DETECTED Final   Enteropathogenic E coli (EPEC) NOT DETECTED NOT DETECTED Final   Enterotoxigenic E coli (ETEC) NOT DETECTED NOT DETECTED Final   Shiga like toxin producing E coli (STEC) NOT DETECTED NOT DETECTED Final   Shigella/Enteroinvasive E coli (EIEC) NOT DETECTED NOT DETECTED Final   Cryptosporidium NOT DETECTED NOT DETECTED Final   Cyclospora cayetanensis NOT DETECTED NOT DETECTED Final   Entamoeba histolytica NOT DETECTED NOT DETECTED Final   Giardia lamblia NOT DETECTED NOT DETECTED Final   Adenovirus F40/41 NOT DETECTED NOT DETECTED Final   Astrovirus NOT DETECTED NOT DETECTED Final   Norovirus GI/GII NOT DETECTED NOT DETECTED Final   Rotavirus A NOT DETECTED NOT DETECTED Final   Sapovirus (I, II, IV, and V) NOT DETECTED NOT DETECTED Final  C difficile quick scan w PCR reflex     Status: None   Collection Time: 09/09/16  2:01 PM  Result Value Ref Range Status   C Diff antigen NEGATIVE NEGATIVE Final   C Diff toxin NEGATIVE NEGATIVE Final   C Diff interpretation No C. difficile detected.  Final  MRSA PCR Screening     Status: Abnormal   Collection Time: 09/10/16  4:45 PM  Result Value Ref Range Status   MRSA  by PCR POSITIVE (A) NEGATIVE Final    Comment:        The GeneXpert MRSA Assay (FDA approved for NASAL specimens only), is one component of a comprehensive MRSA colonization surveillance program. It is not intended to diagnose MRSA infection nor to guide or monitor treatment for MRSA infections. RESULT CALLED TO, READ BACK BY AND VERIFIED WITH: C WHITESIDE,RN AT 1923 09/10/16 BY L BENFIELD     Radiology Studies: US Abdomen Limited Ruq  Result Date: 09/09/2016 CLINICAL DATA:  Transaminitis EXAM: US ABDOMEN LIMITED - RIGHT UPPER QUADRANT COMPARISON:  None. FINDINGS: Gallbladder: No gallstones or wall thickening visualized. No sonographic Murphy sign noted by sonographer. Common bile duct: Diameter: 4.2 mm Liver: Increased parenchymal echogenicity. No mass or focal lesion. Liver normal in overall size. IMPRESSION: 1. No acute findings.  Normal gallbladder.  No bile duct dilation. 2. Increased liver parenchymal echogenicity consistent with hepatic steatosis. Electronically Signed   By: Lajean Manes M.D.   On: 09/09/2016 16:31   Scheduled Meds: . aspirin  325 mg Oral Daily  . atorvastatin  40 mg Oral q1800  . carvedilol  3.125 mg Oral BID WC  . colchicine  0.6 mg Oral BID  . feeding supplement (ENSURE ENLIVE)  237 mL Oral BID BM  . feeding supplement (GLUCERNA SHAKE)  237 mL Oral BID BM  . heparin  5,000 Units Subcutaneous Q8H  . insulin aspart  0-15 Units Subcutaneous TID WC  . insulin aspart  4 Units Subcutaneous TID WC  . insulin glargine  10 Units Subcutaneous Daily  . levothyroxine  100 mcg Oral QAC  breakfast  . mupirocin cream   Topical BID  . piperacillin-tazobactam (ZOSYN)  IV  3.375 g Intravenous Q8H  . pregabalin  200 mg Oral Daily  . tamsulosin  0.4 mg Oral Daily  . vancomycin  750 mg Intravenous Q24H   Continuous Infusions:    LOS: 3 days   Time spent: 20 minutes   Faye Ramsay, MD [facility] Pager [phone]  If 7PM-7AM, please contact  night-coverage [email or web address] Password [email or web address] 09/11/2016, 9:57 AM

## 2016-09-12 ENCOUNTER — Inpatient Hospital Stay (HOSPITAL_COMMUNITY): Payer: BLUE CROSS/BLUE SHIELD | Admitting: Anesthesiology

## 2016-09-12 ENCOUNTER — Encounter (HOSPITAL_COMMUNITY)
Admission: EM | Disposition: A | Discharge status: Skilled Nursing Facility | Payer: Self-pay | Source: Home / Self Care | Attending: Internal Medicine

## 2016-09-12 ENCOUNTER — Encounter (HOSPITAL_COMMUNITY): Payer: Self-pay | Admitting: Surgery

## 2016-09-12 DIAGNOSIS — T8743 Infection of amputation stump, right lower extremity: Secondary | ICD-10-CM

## 2016-09-12 HISTORY — PX: APPLICATION OF WOUND VAC: SHX5189

## 2016-09-12 HISTORY — PX: AMPUTATION: SHX166

## 2016-09-12 LAB — GLUCOSE, CAPILLARY
GLUCOSE-CAPILLARY: 63 mg/dL — AB (ref 65–99)
GLUCOSE-CAPILLARY: 59 mg/dL — AB (ref 65–99)
GLUCOSE-CAPILLARY: 60 mg/dL — AB (ref 65–99)
GLUCOSE-CAPILLARY: 77 mg/dL (ref 65–99)
Glucose-Capillary: 51 mg/dL — ABNORMAL LOW (ref 65–99)
Glucose-Capillary: 63 mg/dL — ABNORMAL LOW (ref 65–99)
Glucose-Capillary: 89 mg/dL (ref 65–99)
Glucose-Capillary: 84 mg/dL (ref 65–99)

## 2016-09-12 LAB — BASIC METABOLIC PANEL
ANION GAP: 8 (ref 5–15)
BUN: 13 mg/dL (ref 6–20)
CHLORIDE: 105 mmol/L (ref 101–111)
CO2: 21 mmol/L — ABNORMAL LOW (ref 22–32)
Calcium: 7.9 mg/dL — ABNORMAL LOW (ref 8.9–10.3)
Creatinine, Ser: 1.42 mg/dL — ABNORMAL HIGH (ref 0.61–1.24)
GFR calc Af Amer: >60 mL/min (ref >60)
GFR calc non Af Amer: 52 mL/min — ABNORMAL LOW (ref >60)
Glucose, Bld: 82 mg/dL (ref 65–99)
POTASSIUM: 3.8 mmol/L (ref 3.5–5.1)
Sodium: 134 mmol/L — ABNORMAL LOW (ref 135–145)

## 2016-09-12 LAB — PREPARE RBC (CROSSMATCH)

## 2016-09-12 LAB — ABO/RH: ABO/RH(D): B POS

## 2016-09-12 SURGERY — AMPUTATION, ABOVE KNEE
Anesthesia: General | Site: Knee | Laterality: Right

## 2016-09-12 MED ORDER — 0.9 % SODIUM CHLORIDE (POUR BTL) OPTIME
TOPICAL | Status: DC | PRN
  Administered 2016-09-12: 1000 mL

## 2016-09-12 MED ORDER — FENTANYL CITRATE (PF) 100 MCG/2ML IJ SOLN
INTRAMUSCULAR | Status: DC | PRN
  Administered 2016-09-12: 50 ug via INTRAVENOUS
  Administered 2016-09-12 (×2): 75 ug via INTRAVENOUS

## 2016-09-12 MED ORDER — LIDOCAINE 2% (20 MG/ML) 5 ML SYRINGE
INTRAMUSCULAR | Status: DC | PRN
  Administered 2016-09-12: 80 mg via INTRAVENOUS

## 2016-09-12 MED ORDER — ACETAMINOPHEN 325 MG PO TABS
650.0000 mg | ORAL_TABLET | Freq: Four times a day (QID) | ORAL | Status: DC | PRN
  Administered 2016-09-13: 650 mg via ORAL
  Filled 2016-09-12: qty 2

## 2016-09-12 MED ORDER — PHENYLEPHRINE 40 MCG/ML (10ML) SYRINGE FOR IV PUSH (FOR BLOOD PRESSURE SUPPORT)
PREFILLED_SYRINGE | INTRAVENOUS | Status: DC | PRN
  Administered 2016-09-12: 120 ug via INTRAVENOUS
  Administered 2016-09-12 (×2): 80 ug via INTRAVENOUS

## 2016-09-12 MED ORDER — LACTATED RINGERS IV SOLN: INTRAVENOUS | Status: DC

## 2016-09-12 MED ORDER — METHOCARBAMOL 500 MG PO TABS
500.0000 mg | ORAL_TABLET | Freq: Four times a day (QID) | ORAL | Status: DC | PRN
  Administered 2016-09-13 – 2016-09-14 (×2): 500 mg via ORAL
  Filled 2016-09-12 (×2): qty 1

## 2016-09-12 MED ORDER — PHENYLEPHRINE 40 MCG/ML (10ML) SYRINGE FOR IV PUSH (FOR BLOOD PRESSURE SUPPORT)
PREFILLED_SYRINGE | INTRAVENOUS | Status: AC
  Filled 2016-09-12: qty 30

## 2016-09-12 MED ORDER — METOCLOPRAMIDE HCL 5 MG/ML IJ SOLN: 5.0000 mg | Freq: Three times a day (TID) | INTRAMUSCULAR | Status: DC | PRN

## 2016-09-12 MED ORDER — CHLORHEXIDINE GLUCONATE 4 % EX LIQD
60.0000 mL | Freq: Once | CUTANEOUS | Status: AC
  Administered 2016-09-12: 4 via TOPICAL

## 2016-09-12 MED ORDER — SODIUM CHLORIDE 0.9 % IV SOLN
INTRAVENOUS | Status: DC
  Administered 2016-09-15: 23:00:00 via INTRAVENOUS

## 2016-09-12 MED ORDER — FENTANYL CITRATE (PF) 250 MCG/5ML IJ SOLN
INTRAMUSCULAR | Status: AC
  Filled 2016-09-12: qty 5

## 2016-09-12 MED ORDER — POLYETHYLENE GLYCOL 3350 17 G PO PACK
17.0000 g | PACK | Freq: Every day | ORAL | Status: DC | PRN
Start: 2016-09-12 — End: 2016-09-13

## 2016-09-12 MED ORDER — METOCLOPRAMIDE HCL 5 MG PO TABS: 5.0000 mg | ORAL_TABLET | Freq: Three times a day (TID) | ORAL | Status: DC | PRN

## 2016-09-12 MED ORDER — DOCUSATE SODIUM 100 MG PO CAPS
100.0000 mg | ORAL_CAPSULE | Freq: Two times a day (BID) | ORAL | Status: DC
  Administered 2016-09-12 – 2016-09-13 (×2): 100 mg via ORAL
  Filled 2016-09-12 (×2): qty 1

## 2016-09-12 MED ORDER — MAGNESIUM CITRATE PO SOLN: 1.0000 | Freq: Once | ORAL | Status: DC | PRN

## 2016-09-12 MED ORDER — PROPOFOL 10 MG/ML IV BOLUS
INTRAVENOUS | Status: DC | PRN
  Administered 2016-09-12: 130 mg via INTRAVENOUS

## 2016-09-12 MED ORDER — PROPOFOL 10 MG/ML IV BOLUS
INTRAVENOUS | Status: AC
  Filled 2016-09-12: qty 40

## 2016-09-12 MED ORDER — ONDANSETRON HCL 4 MG/2ML IJ SOLN: 4.0000 mg | Freq: Four times a day (QID) | INTRAMUSCULAR | Status: DC | PRN

## 2016-09-12 MED ORDER — BISACODYL 10 MG RE SUPP: 10.0000 mg | Freq: Every day | RECTAL | Status: DC | PRN

## 2016-09-12 MED ORDER — LACTATED RINGERS IV SOLN
INTRAVENOUS | Status: DC | PRN
  Administered 2016-09-12: 16:00:00 via INTRAVENOUS

## 2016-09-12 MED ORDER — SUCCINYLCHOLINE CHLORIDE 200 MG/10ML IV SOSY
PREFILLED_SYRINGE | INTRAVENOUS | Status: DC | PRN
  Administered 2016-09-12: 120 mg via INTRAVENOUS

## 2016-09-12 MED ORDER — POVIDONE-IODINE 10 % EX SWAB: 2.0000 "application " | Freq: Once | CUTANEOUS | Status: DC

## 2016-09-12 MED ORDER — SODIUM CHLORIDE 0.9 % IV SOLN
Freq: Once | INTRAVENOUS | Status: AC
  Administered 2016-09-12: 17:00:00 via INTRAVENOUS

## 2016-09-12 MED ORDER — MIDAZOLAM HCL 2 MG/2ML IJ SOLN
INTRAMUSCULAR | Status: AC
  Filled 2016-09-12: qty 2

## 2016-09-12 MED ORDER — METHOCARBAMOL 1000 MG/10ML IJ SOLN: 500.0000 mg | Freq: Four times a day (QID) | INTRAVENOUS | Status: DC | PRN

## 2016-09-12 MED ORDER — OXYCODONE HCL 5 MG PO TABS
5.0000 mg | ORAL_TABLET | ORAL | Status: DC | PRN
  Administered 2016-09-12 – 2016-09-16 (×5): 10 mg via ORAL
  Filled 2016-09-12 (×6): qty 2

## 2016-09-12 MED ORDER — ONDANSETRON HCL 4 MG PO TABS: 4.0000 mg | ORAL_TABLET | Freq: Four times a day (QID) | ORAL | Status: DC | PRN

## 2016-09-12 MED ORDER — ONDANSETRON HCL 4 MG/2ML IJ SOLN
INTRAMUSCULAR | Status: AC
  Filled 2016-09-12: qty 2

## 2016-09-12 MED ORDER — ONDANSETRON HCL 4 MG/2ML IJ SOLN
INTRAMUSCULAR | Status: DC | PRN
  Administered 2016-09-12: 4 mg via INTRAVENOUS

## 2016-09-12 MED ORDER — COLLAGENASE 250 UNIT/GM EX OINT
TOPICAL_OINTMENT | Freq: Every day | CUTANEOUS | Status: DC
  Administered 2016-09-13 – 2016-09-15 (×2): via TOPICAL
  Filled 2016-09-12: qty 30

## 2016-09-12 MED ORDER — ACETAMINOPHEN 650 MG RE SUPP: 650.0000 mg | Freq: Four times a day (QID) | RECTAL | Status: DC | PRN

## 2016-09-12 MED ORDER — HYDROMORPHONE HCL 1 MG/ML IJ SOLN
1.0000 mg | INTRAMUSCULAR | Status: DC | PRN
  Administered 2016-09-13: 1 mg via INTRAVENOUS
  Filled 2016-09-12: qty 1

## 2016-09-12 SURGICAL SUPPLY — 51 items
APL SKNCLS STERI-STRIP NONHPOA (GAUZE/BANDAGES/DRESSINGS) ×1
BENZOIN TINCTURE PRP APPL 2/3 (GAUZE/BANDAGES/DRESSINGS) ×1 IMPLANT
BLADE SAW RECIP 87.9 MT (BLADE) ×2 IMPLANT
BNDG COHESIVE 6X5 TAN STRL LF (GAUZE/BANDAGES/DRESSINGS) ×4 IMPLANT
BNDG GAUZE ELAST 4 BULKY (GAUZE/BANDAGES/DRESSINGS) ×1 IMPLANT
CANISTER WOUND CARE 500ML ATS (WOUND CARE) ×1 IMPLANT
COVER SURGICAL LIGHT HANDLE (MISCELLANEOUS) ×4 IMPLANT
CUFF TOURNIQUET SINGLE 34IN LL (TOURNIQUET CUFF) IMPLANT
CUFF TOURNIQUET SINGLE 44IN (TOURNIQUET CUFF) IMPLANT
DRAIN PENROSE 1/2X12 LTX STRL (WOUND CARE) IMPLANT
DRAPE EXTREMITY T 121X128X90 (DRAPE) ×1 IMPLANT
DRAPE HALF SHEET 40X57 (DRAPES) ×2 IMPLANT
DRAPE INCISE IOBAN 66X45 STRL (DRAPES) ×2 IMPLANT
DRAPE PROXIMA HALF (DRAPES) ×2 IMPLANT
DRAPE U-SHAPE 47X51 STRL (DRAPES) ×4 IMPLANT
DRSG ADAPTIC 3X8 NADH LF (GAUZE/BANDAGES/DRESSINGS) ×1 IMPLANT
DRSG PAD ABDOMINAL 8X10 ST (GAUZE/BANDAGES/DRESSINGS) ×4 IMPLANT
DRSG VAC ATS LRG SENSATRAC (GAUZE/BANDAGES/DRESSINGS) ×1 IMPLANT
DURAPREP 26ML APPLICATOR (WOUND CARE) ×2 IMPLANT
ELECT CAUTERY BLADE 6.4 (BLADE) IMPLANT
ELECT REM PT RETURN 9FT ADLT (ELECTROSURGICAL) ×2
ELECTRODE REM PT RTRN 9FT ADLT (ELECTROSURGICAL) ×1 IMPLANT
EVACUATOR 1/8 PVC DRAIN (DRAIN) IMPLANT
GAUZE SPONGE 4X4 12PLY STRL (GAUZE/BANDAGES/DRESSINGS) ×1 IMPLANT
GLOVE BIOGEL PI IND STRL 7.0 (GLOVE) IMPLANT
GLOVE BIOGEL PI IND STRL 9 (GLOVE) ×1 IMPLANT
GLOVE BIOGEL PI INDICATOR 7.0 (GLOVE) ×1
GLOVE BIOGEL PI INDICATOR 9 (GLOVE) ×1
GLOVE ECLIPSE 6.5 STRL STRAW (GLOVE) ×2 IMPLANT
GLOVE SURG ORTHO 9.0 STRL STRW (GLOVE) ×2 IMPLANT
GOWN STRL REUS W/ TWL XL LVL3 (GOWN DISPOSABLE) ×2 IMPLANT
GOWN STRL REUS W/TWL XL LVL3 (GOWN DISPOSABLE) ×4
KIT BASIN OR (CUSTOM PROCEDURE TRAY) ×2 IMPLANT
KIT ROOM TURNOVER OR (KITS) ×2 IMPLANT
MANIFOLD NEPTUNE II (INSTRUMENTS) ×2 IMPLANT
NS IRRIG 1000ML POUR BTL (IV SOLUTION) ×2 IMPLANT
PACK GENERAL/GYN (CUSTOM PROCEDURE TRAY) ×2 IMPLANT
PAD ARMBOARD 7.5X6 YLW CONV (MISCELLANEOUS) ×2 IMPLANT
STAPLER VISISTAT 35W (STAPLE) IMPLANT
STOCKINETTE IMPERVIOUS LG (DRAPES) ×1 IMPLANT
SUT ETHILON 2 0 PSLX (SUTURE) ×3 IMPLANT
SUT PDS AB 1 CT  36 (SUTURE)
SUT PDS AB 1 CT 36 (SUTURE) IMPLANT
SUT SILK 2 0 (SUTURE) ×2
SUT SILK 2-0 18XBRD TIE 12 (SUTURE) ×1 IMPLANT
SUT VIC AB 1 CTX 27 (SUTURE) ×2 IMPLANT
SWAB COLLECTION DEVICE MRSA (MISCELLANEOUS) IMPLANT
TOWEL OR 17X24 6PK STRL BLUE (TOWEL DISPOSABLE) ×2 IMPLANT
TOWEL OR 17X26 10 PK STRL BLUE (TOWEL DISPOSABLE) ×2 IMPLANT
TUBE ANAEROBIC SPECIMEN COL (MISCELLANEOUS) IMPLANT
WATER STERILE IRR 1000ML POUR (IV SOLUTION) ×2 IMPLANT

## 2016-09-12 NOTE — Op Note (Signed)
09/08/2016 - 09/12/2016  5:35 PM  PATIENT:  Todd [name]    PRE-OPERATIVE DIAGNOSIS:  Infected Right below the knee amputation  POST-OPERATIVE DIAGNOSIS:  Same  PROCEDURE:  AMPUTATION ABOVE KNEE, APPLICATION OF WOUND VAC ABOVE KNEE  SURGEON:  Newt Minion, MD  PHYSICIAN ASSISTANT:None ANESTHESIA:   General  PREOPERATIVE INDICATIONS:  Todd [name] is a  61 y.o. male with a diagnosis of Infected Right Leg who failed conservative measures and elected for surgical management.    The risks benefits and alternatives were discussed with the patient preoperatively including but not limited to the risks of infection, bleeding, nerve injury, cardiopulmonary complications, the need for revision surgery, among others, and the patient was willing to proceed.  OPERATIVE IMPLANTS: Incisional wound VAC  OPERATIVE FINDINGS: Ischemic muscle at mid thigh  OPERATIVE PROCEDURE: Patient was brought to the operating room and underwent a general anesthetic. After adequate levels anesthesia were obtained patient's right lower extremity was prepped using DuraPrep draped into a sterile field. The necrotic amputation was draped out of the sterile field with Ioban. A timeout was called. A fishmouth incision was made through the distal thigh. This was carried down to the neurovascular bundle medially. This was clamped the femur was amputated with a reciprocating saw. The vascular bundles were suture ligated with 2-0 silk. Hemostasis was obtained the wound was irrigated with normal saline. The deep and superficial fascial layers and skin was closed using 2-0 nylon. A incisional wound VAC was applied this had a good suction fit patient was extubated taken to the PACU in stable condition.

## 2016-09-12 NOTE — Progress Notes (Signed)
Pt presented back to unit from OR alert and oriented. No c/o pain/discomfort.  Call bell at side. Wound vac in place to right AKA.

## 2016-09-12 NOTE — Consult Note (Signed)
[place] Nurse wound consult note Reason for Consult: Consult requested for bilat buttocks Wound type: Pt states these wounds have been present "for awhile before admission." Pressure Injury POA: Yes Measurement:Left buttock 6X3X.2cm, red and moist stage 3, no odor, scant amt yellow drainage Right buttock 6X2.5X.2cm, 80% red and moist stage 3, 20% unstageable yellow slough tightly adhered in the center of the wound Periwound: Pink dry scar tissue surrounding wound edges Dressing procedure/placement/frequency: Pt is on a low airloss bed to reduce pressure.  Foam dressing in place to protect and promote healing.  Santyl ointment to provide enzymatic debridement of nonviable tissue.  Discussed plan of care with patient and he verbalized understanding. Please re-consult if further assistance is needed.  Thank-you,  Julien Girt MSN, [place], [place], [place], [place]

## 2016-09-12 NOTE — Anesthesia Procedure Notes (Signed)
Procedure Name: Intubation Date/Time: 09/12/2016 4:50 PM Performed by: Everlean Cherry A Pre-anesthesia Checklist: Patient identified, Emergency Drugs available, Suction available and Patient being monitored Patient Re-evaluated:Patient Re-evaluated prior to inductionOxygen Delivery Method: Circle system utilized Preoxygenation: Pre-oxygenation with 100% oxygen Intubation Type: IV induction and Rapid sequence Laryngoscope Size: Miller and 2 Grade View: Grade I Tube type: Oral Tube size: 7.5 mm Number of attempts: 1 Airway Equipment and Method: Stylet Placement Confirmation: ETT inserted through vocal cords under direct vision,  positive ETCO2 and breath sounds checked- equal and bilateral Secured at: 24 cm Tube secured with: Tape Dental Injury: Teeth and Oropharynx as per pre-operative assessment

## 2016-09-12 NOTE — Progress Notes (Signed)
Patient ID: Todd [name], male   DOB: Sep 28, 1955, 61 y.o.   MRN: [id number]   PROGRESS NOTE  Todd [name]  WHQ:[id number] DOB: [birth year] DOA: 09/08/2016  PCP: [name], MD   Brief Narrative:  61 y.o. male with known gout, hypertension, hyperlipidemia and recent hospitalization with right BKA. He presented with bilateral leg pain. States the pain has been worsening throbbing for last 3 days. Rates pain as 4-10. Has been taking outpatient narcotics with little to no effect.   Assessment & Plan:   Sepsis secondary to Wound infection R BKA - pt met criteria for sepsis on admission, T 101.5, WBC 25, HR 90 - pt reported some purulent drainage from R BKA - pt is currently post op day #26 s/p R BKA  - vascular surgery consulted and recommends R AKA - appreciate Dr. Jess Barters assistance, plan to take pt to OR this afternoon  - continue vanc and zosyn day #5 - follow up on blood cultures   CKD stage III - about 8 days ago Cr was as high as 2.1 and on admission Cr 2 - Cr trending down  - BMP in AM  Chronic systolic dysfunction - Echocardiogram 3/23 read as EF 45-50% with basal midinferior myocardial hypokinesis.  - There is no evidence of ACS with unchanged ECG, no angina. - no chest pain this AM   Transaminitis - unclear etiology - RUQ with no acute findings  - LFT's trending down  - CMET in AM  CAD - Daily aspirin  Malnutrition, moderate  - nutritionist consulted   BPH - Cont flomax  Chronic diarrhea - check stool panel, C. Diff negative - allow imodium as needed   Gout - Continue: Colcrys bid  Hypothyroidism - continue synthroid  Hyperkalemia - resolved   Anemia of chronic disease  - drop in Hg noted since admission but no evidence of active bleeding  - CBC pending this AM  - CBC in AM  Foley cath in place - asked RN to replace or do In/Out cath   Penile infection - pus noted around  - current ABX should be adequate   Thrombocytosis -  reactive and continues trending up, suspect it will continue to rise until wound infection resolved  - CBC in AM   Hypertension, essential  - continue Coreg  - reasonable control for now   DM type II with complications of nephropathy and PVD - continue lantus and SSI   Hyperlipidemia - Continue statin  DVT prophylaxis: Heparin SQ Code Status: Full  Family Communication: Patient at bedside, no family at bedside this am Disposition Plan: to be determined   Consultants:   Vascular surgery   Dr. Sharol Given  Procedures:   None  Antimicrobials:   Vancomycin 3/29 -->  Zosyn 3/29 -->   Subjective: No events overnight except for persistent diarrhea.  Objective: Vitals:   09/11/16 0005 09/11/16 0830 09/11/16 1300 09/11/16 2200  BP: (!) 105/51 116/64 (!) 114/59 110/67  Pulse: 84 83 80 74  Resp: 18  18   Temp: 98.2 F (36.8 C) 98 F (36.7 C) 98.1 F (36.7 C) 98.4 F (36.9 C)  TempSrc: Oral Oral Oral Oral  SpO2: 100% 100% 99% 100%  Weight:   58.5 kg (129 lb)     Intake/Output Summary (Last 24 hours) at 09/12/16 0920 Last data filed at 09/11/16 1700  Gross per 24 hour  Intake              880 ml  Output  0 ml  Net              880 ml   Examination:  General exam: Appears calm and comfortable  Respiratory system: Clear to auscultation. Respiratory effort normal. Cardiovascular system: S1 & S2 heard, RRR. No rubs, gallops or clicks.  Gastrointestinal system: Abdomen is nondistended, soft and nontender. No organomegaly or masses felt.. Central nervous system: Alert and oriented.  Extremities: R BKA stump   Data Reviewed: I have personally reviewed following labs and imaging studies  CBC:  Recent Labs Lab 09/08/16 1259 09/08/16 1911 09/09/16 0418 09/10/16 0345 09/11/16 0501  WBC 25.1* 24.3* 22.5* 24.8* 30.6*  NEUTROABS 20.2*  --   --   --   --   HGB 8.6* 7.9* 7.4* 7.7* 7.2*  HCT 26.1* 25.1* 22.9* 24.2* 22.8*  MCV 80.3 81.2 80.4 81.2 81.4    PLT 759* 858* 775* 847* 045*   Basic Metabolic Panel:  Recent Labs Lab 09/08/16 1259 09/08/16 1911 09/09/16 0418 09/10/16 0345 09/11/16 0501 09/12/16 0541  NA 130*  --  135 135 133* 134*  K 5.3*  --  4.0 3.8 4.1 3.8  CL 98*  --  108 108 104 105  CO2 18*  --  15* 17* 19* 21*  GLUCOSE 94  --  111* 69 108* 82  BUN 49*  --  29* _0 CREATININE 2.01* 1.74* 1.48* 1.33* 1.59* 1.42*  CALCIUM 8.4*  --  7.5* 7.6* 7.9* 7.9*  MG  --   --  1.8  --   --   --   PHOS  --   --  2.9  --   --   --    Liver Function Tests:  Recent Labs Lab 09/08/16 1259 09/10/16 0345  AST 163* 58*  ALT 103* 59  ALKPHOS 274* 263*  BILITOT 1.5* 0.6  PROT 8.7* 7.2  ALBUMIN 2.0* 1.6*   Coagulation Profile:  Recent Labs Lab 09/09/16 0418  INR 1.24   CBG:  Recent Labs Lab 09/10/16 2219 09/10/16 2320 09/11/16 1038 09/12/16 0718 09/12/16 0739  GLUCAP 66 73 98 59* 60*   Urine analysis:    Component Value Date/Time   COLORURINE YELLOW 09/08/2016 1306   APPEARANCEUR HAZY (A) 09/08/2016 1306   APPEARANCEUR Clear 08/05/2016 1630   LABSPEC 1.019 09/08/2016 1306   PHURINE 5.0 09/08/2016 1306   GLUCOSEU NEGATIVE 09/08/2016 1306   HGBUR NEGATIVE 09/08/2016 1306   BILIRUBINUR NEGATIVE 09/08/2016 1306   BILIRUBINUR Negative 08/05/2016 1630   KETONESUR NEGATIVE 09/08/2016 1306   PROTEINUR 30 (A) 09/08/2016 1306   NITRITE NEGATIVE 09/08/2016 1306   LEUKOCYTESUR NEGATIVE 09/08/2016 1306   LEUKOCYTESUR Negative 08/05/2016 1630   Recent Results (from the past 240 hour(s))  Urine culture     Status: None   Collection Time: 09/08/16 12:59 PM  Result Value Ref Range Status   Specimen Description URINE, CATHETERIZED  Final   Special Requests NONE  Final   Culture NO GROWTH  Final   Report Status 09/09/2016 FINAL  Final  Blood Culture (routine x 2)     Status: None (Preliminary result)   Collection Time: 09/08/16  1:15 PM  Result Value Ref Range Status   Specimen Description BLOOD RIGHT HAND   Final   Special Requests BOTTLES DRAWN AEROBIC AND ANAEROBIC 5CC  Final   Culture NO GROWTH 3 DAYS  Final   Report Status PENDING  Incomplete  Blood Culture (routine x 2)     Status: None (  Preliminary result)   Collection Time: 09/08/16  1:15 PM  Result Value Ref Range Status   Specimen Description BLOOD RIGHT FOREARM  Final   Special Requests BOTTLES DRAWN AEROBIC AND ANAEROBIC 5CC  Final   Culture NO GROWTH 3 DAYS  Final   Report Status PENDING  Incomplete  Gastrointestinal Panel by PCR , Stool     Status: None   Collection Time: 09/09/16  2:01 PM  Result Value Ref Range Status   Campylobacter species NOT DETECTED NOT DETECTED Final   Plesimonas shigelloides NOT DETECTED NOT DETECTED Final   Salmonella species NOT DETECTED NOT DETECTED Final   Yersinia enterocolitica NOT DETECTED NOT DETECTED Final   Vibrio species NOT DETECTED NOT DETECTED Final   Vibrio cholerae NOT DETECTED NOT DETECTED Final   Enteroaggregative E coli (EAEC) NOT DETECTED NOT DETECTED Final   Enteropathogenic E coli (EPEC) NOT DETECTED NOT DETECTED Final   Enterotoxigenic E coli (ETEC) NOT DETECTED NOT DETECTED Final   Shiga like toxin producing E coli (STEC) NOT DETECTED NOT DETECTED Final   Shigella/Enteroinvasive E coli (EIEC) NOT DETECTED NOT DETECTED Final   Cryptosporidium NOT DETECTED NOT DETECTED Final   Cyclospora cayetanensis NOT DETECTED NOT DETECTED Final   Entamoeba histolytica NOT DETECTED NOT DETECTED Final   Giardia lamblia NOT DETECTED NOT DETECTED Final   Adenovirus F40/41 NOT DETECTED NOT DETECTED Final   Astrovirus NOT DETECTED NOT DETECTED Final   Norovirus GI/GII NOT DETECTED NOT DETECTED Final   Rotavirus A NOT DETECTED NOT DETECTED Final   Sapovirus (I, II, IV, and V) NOT DETECTED NOT DETECTED Final  C difficile quick scan w PCR reflex     Status: None   Collection Time: 09/09/16  2:01 PM  Result Value Ref Range Status   C Diff antigen NEGATIVE NEGATIVE Final   C Diff toxin NEGATIVE  NEGATIVE Final   C Diff interpretation No C. difficile detected.  Final  MRSA PCR Screening     Status: Abnormal   Collection Time: 09/10/16  4:45 PM  Result Value Ref Range Status   MRSA by PCR POSITIVE (A) NEGATIVE Final    Comment:        The GeneXpert MRSA Assay (FDA approved for NASAL specimens only), is one component of a comprehensive MRSA colonization surveillance program. It is not intended to diagnose MRSA infection nor to guide or monitor treatment for MRSA infections. RESULT CALLED TO, READ BACK BY AND VERIFIED WITH: C WHITESIDE,RN AT 1923 09/10/16 BY L BENFIELD     Radiology Studies: No results found. Scheduled Meds: . aspirin  325 mg Oral Daily  . atorvastatin  40 mg Oral q1800  . carvedilol  3.125 mg Oral BID WC  . colchicine  0.6 mg Oral BID  . feeding supplement (ENSURE ENLIVE)  237 mL Oral BID BM  . feeding supplement (GLUCERNA SHAKE)  237 mL Oral BID BM  . heparin  5,000 Units Subcutaneous Q8H  . insulin aspart  0-15 Units Subcutaneous TID WC  . insulin aspart  4 Units Subcutaneous TID WC  . insulin glargine  10 Units Subcutaneous Daily  . levothyroxine  100 mcg Oral QAC breakfast  . mupirocin cream   Topical BID  . piperacillin-tazobactam (ZOSYN)  IV  3.375 g Intravenous Q8H  . pregabalin  200 mg Oral Daily  . tamsulosin  0.4 mg Oral Daily  . vancomycin  750 mg Intravenous Q24H   Continuous Infusions:    LOS: 4 days   Time spent: 20  minutes   Faye Ramsay, MD [facility] Pager [phone]  If 7PM-7AM, please contact night-coverage [email or web address] Password [email or web address] 09/12/2016, 9:20 AM

## 2016-09-12 NOTE — Transfer of Care (Signed)
Immediate Anesthesia Transfer of Care Note  Patient: Todd [name]  Procedure(s) Performed: Procedure(s): AMPUTATION ABOVE KNEE (Right) APPLICATION OF WOUND VAC ABOVE KNEE (Right)  Patient Location: PACU  Anesthesia Type:General  Level of Consciousness: awake, alert , oriented and patient cooperative  Airway & Oxygen Therapy: Patient Spontanous Breathing and Patient connected to face mask oxygen  Post-op Assessment: Report given to RN and Post -op Vital signs reviewed and stable  Post vital signs: Reviewed and stable  Last Vitals:  Vitals:   09/11/16 1300 09/11/16 2200  BP: (!) 114/59 110/67  Pulse: 80 74  Resp: 18   Temp: 36.7 C 36.9 C    Last Pain:  Vitals:   09/12/16 1142  TempSrc:   PainSc: 2       Patients Stated Pain Goal: 2 (64/15/83 0940)  Complications: No apparent anesthesia complications

## 2016-09-12 NOTE — Consult Note (Signed)
ORTHOPAEDIC CONSULTATION  REQUESTING PHYSICIAN: Theodis Blaze, MD  Chief Complaint: Gangrene right transtibial amputation with flexion contracture of his knee  HPI: Todd [name] is a 61 y.o. male who presents with diabetic insensate neuropathy peripheral vascular disease status post revascularization. Patient has recently undergone a left transtibial amputation as well as a right transtibial amputation. Patient has had progressive gangrenous changes to the right transtibial amputation. Patient also is developing a fixed flexion contracture.  Past Medical History:  Diagnosis Date  . Diabetes mellitus without complication ([place])   . Goiter   . Gout   . Hyperlipidemia   . Hypertension   . Thyroid disease   . Wound infection after surgery 08/2016   Past Surgical History:  Procedure Laterality Date  . ABDOMINAL AORTOGRAM N/A 08/11/2016   Procedure: Abdominal Aortogram;  Surgeon: Waynetta Sandy, MD;  Location: [facility];  Service: Cardiovascular;  Laterality: N/A;  . ABDOMINAL AORTOGRAM W/LOWER EXTREMITY N/A 08/15/2016   Procedure: Abdominal Aortogram w/Lower Extremity;  Surgeon: Elam Dutch, MD;  Location: [facility];  Service: Cardiovascular;  Laterality: N/A;  . AMPUTATION Left 08/12/2016   Procedure: LEFT BELOW KNEE AMPUTATION;  Surgeon: Newt Minion, MD;  Location: [facility];  Service: Orthopedics;  Laterality: Left;  . AMPUTATION Right 08/17/2016   Procedure: RIGHT BELOW KNEE AMPUTATION;  Surgeon: Elam Dutch, MD;  Location: [facility];  Service: Vascular;  Laterality: Right;  . LOWER EXTREMITY ANGIOGRAPHY Bilateral 08/11/2016   Procedure: Lower Extremity Angiography;  Surgeon: Waynetta Sandy, MD;  Location: [facility];  Service: Cardiovascular;  Laterality: Bilateral;  . PERIPHERAL VASCULAR BALLOON ANGIOPLASTY Left 08/11/2016   Procedure: Peripheral Vascular Balloon Angioplasty;  Surgeon: Waynetta Sandy, MD;  Location: [facility];  Service: Cardiovascular;  Laterality: Left;  SFA  . THYROID SURGERY     Social History   Social History  . Marital status: Single    Spouse name: N/A  . Number of children: N/A  . Years of education: N/A   Occupational History  . drives a forklift    Social History Main Topics  . Smoking status: Current Every Day Smoker    Packs/day: 0.75    Years: 45.00    Types: Cigarettes    Last attempt to quit: 11/12/2014  . Smokeless tobacco: Never Used  . Alcohol use No     Comment: h/o heavy use; stopped drinking in 7/16  . Drug use: No  . Sexual activity: Not Currently   Other Topics Concern  . None   Social History Narrative  . None   Family History  Problem Relation Age of Onset  . Heart disease Mother   . Pneumonia Father   . Diabetes Maternal Aunt   . Diabetes Maternal Uncle    - negative except otherwise stated in the family history section Allergies  Allergen Reactions  . Lisinopril Other (See Comments)    Hyperkalemia / Renal failure   Prior to Admission medications   Medication Sig Start Date End Date Taking? Authorizing Provider  acetaminophen (TYLENOL) 325 MG tablet Take 2 tablets (650 mg total) by mouth every 6 (six) hours as needed for mild pain (or Fever >/= 101). 08/19/16  Yes Shanker Kristeen Mans, MD  aspirin EC 325 MG EC tablet Take 1 tablet (325 mg total) by mouth daily. 08/20/16  Yes Shanker Kristeen Mans, MD  atorvastatin (LIPITOR) 40 MG tablet Take 1 tablet (40 mg total) by mouth  daily at 6 PM. 09/04/16  Yes Patrecia Pour, MD  carvedilol (COREG) 3.125 MG tablet Take 1 tablet (3.125 mg total) by mouth 2 (two) times daily with a meal. 07/06/16  Yes Thurnell Lose, MD  COLCRYS 0.6 MG tablet Take 0.6 mg by mouth 2 (two) times daily. 07/29/16  Yes Historical Provider, MD  feeding supplement, GLUCERNA SHAKE, (GLUCERNA SHAKE) LIQD Take 237 mLs by mouth 2 (two) times daily between meals. 08/19/16  Yes Shanker Kristeen Mans, MD  Insulin Glargine (LANTUS SOLOSTAR) 100  UNIT/ML Solostar Pen Inject 10 Units into the skin daily. 08/19/16  Yes Shanker Kristeen Mans, MD  levothyroxine (SYNTHROID, LEVOTHROID) 100 MCG tablet Take 1 tablet (100 mcg total) by mouth daily before breakfast. 07/07/16  Yes Thurnell Lose, MD  loperamide (IMODIUM) 2 MG capsule Take 4 mg by mouth as needed for diarrhea or loose stools.   Yes Historical Provider, MD  oxyCODONE-acetaminophen (PERCOCET/ROXICET) 5-325 MG tablet Take by mouth every 4 (four) hours as needed for severe pain.   Yes Historical Provider, MD  polyethylene glycol (MIRALAX / GLYCOLAX) packet Take 17 g by mouth daily. 08/19/16  Yes Shanker Kristeen Mans, MD  pregabalin (LYRICA) 200 MG capsule Take 1 capsule (200 mg total) by mouth daily. 09/04/16  Yes Patrecia Pour, MD  tamsulosin (FLOMAX) 0.4 MG CAPS capsule Take 0.4 mg by mouth daily.   Yes Historical Provider, MD  traMADol-acetaminophen (ULTRACET) 37.5-325 MG tablet Take 1 tablet by mouth every 6 (six) hours as needed. 09/04/16  Yes Patrecia Pour, MD   No results found. - pertinent xrays, CT, MRI studies were reviewed and independently interpreted  Positive ROS: All other systems have been reviewed and were otherwise negative with the exception of those mentioned in the HPI and as above.  Physical Exam: General: Alert, no acute distress Psychiatric: Patient is competent for consent with normal mood and affect Lymphatic: No axillary or cervical lymphadenopathy Cardiovascular: No pedal edema Respiratory: No cyanosis, no use of accessory musculature GI: No organomegaly, abdomen is soft and non-tender  Skin:  Examination the left transtibial amputation is healing well staples were in place. Examination the right transtibial amputation patient has gangrene over the residual limb up to the tibial tubercle. There is dry gangrenous changes.   Neurologic: Patient does not have protective sensation bilateral lower extremities.   MUSCULOSKELETAL:  Examination patient has full extension  of the left knee. Right knee patient has a fixed flexion contracture with no active extension of the knee. There is dry gangrenous changes of the entire residual limb distal to the tibial tubercle.  Assessment: Assessment: Slow healing left transtibial amputation with a fixed flexion contracture of the right knee with dry gangrenous changes of the right transtibial amputation.  Plan: Plan: Discussed with the patient his best option would be an above-knee amputation the right due to the extensive gangrenous changes of the residual limb as well as the fixed flexion contracture of the right knee. Risk and benefits were discussed including risk of the incision not healing. Patient states he understands wishes to proceed at this time.  Plan for surgery approximately 4 PM today Monday. Nothing by mouth after breakfast.  Thank you for the consult and the opportunity to see Todd [name], [place] [phone] 8:23 AM

## 2016-09-12 NOTE — Telephone Encounter (Signed)
Shot record is at the front.

## 2016-09-12 NOTE — Anesthesia Preprocedure Evaluation (Addendum)
Anesthesia Evaluation  Patient identified by MRN, date of birth, ID band Patient awake    Reviewed: Allergy & Precautions, NPO status , Patient's Chart, lab work & pertinent test results  Airway Mallampati: II  TM Distance: >3 FB Neck ROM: Full    Dental no notable dental hx.    Pulmonary Current Smoker,    breath sounds clear to auscultation       Cardiovascular hypertension, + Peripheral Vascular Disease   Rhythm:Regular Rate:Normal  ------------------------------------------------------------------- Indications:      Cardiomyopathy - ischemic 414.8.  ------------------------------------------------------------------- History:   Risk factors:  Hypertension. Diabetes mellitus.  ------------------------------------------------------------------- Study Conclusions  - Left ventricle: The cavity size was normal. Systolic function was   mildly reduced. The estimated ejection fraction was in the range   of 45% to 50%. Hypokinesis of the basal-midinferior myocardium.   Hypokinesis of the basal-midanteroseptal myocardium.  ------------------------------------------------------------------- Study data:  No prior study was available for comparison.  Study status:  Routine.  Procedure:  The patient reported no pain pre    Neuro/Psych    GI/Hepatic   Endo/Other  diabetes, Poorly Controlled  Renal/GU Renal InsufficiencyRenal disease     Musculoskeletal   Abdominal   Peds  Hematology   Anesthesia Other Findings   Reproductive/Obstetrics                            Anesthesia Physical Anesthesia Plan  ASA: III  Anesthesia Plan:    Post-op Pain Management:    Induction: Intravenous  Airway Management Planned: Oral ETT  Additional Equipment:   Intra-op Plan:   Post-operative Plan: Extubation in OR and Possible Post-op intubation/ventilation  Informed Consent: I have reviewed the  patients History and Physical, chart, labs and discussed the procedure including the risks, benefits and alternatives for the proposed anesthesia with the patient or authorized representative who has indicated his/her understanding and acceptance.     Plan Discussed with:   Anesthesia Plan Comments: (Anticipate need for  Blood transfusion)       Anesthesia Quick Evaluation

## 2016-09-13 ENCOUNTER — Encounter (HOSPITAL_COMMUNITY): Payer: Self-pay | Admitting: Orthopedic Surgery

## 2016-09-13 LAB — BPAM RBC
Blood Product Expiration Date: 201804172359
Blood Product Expiration Date: 201804202359
ISSUE DATE / TIME: 201804021619
ISSUE DATE / TIME: 201804021619
Unit Type and Rh: 7300
Unit Type and Rh: 7300

## 2016-09-13 LAB — COMPREHENSIVE METABOLIC PANEL
ALT: 31 U/L (ref 17–63)
AST: 37 U/L (ref 15–41)
Albumin: 1.6 g/dL — ABNORMAL LOW (ref 3.5–5.0)
Alkaline Phosphatase: 188 U/L — ABNORMAL HIGH (ref 38–126)
Anion gap: 7 (ref 5–15)
BUN: 10 mg/dL (ref 6–20)
CHLORIDE: 107 mmol/L (ref 101–111)
CO2: 20 mmol/L — ABNORMAL LOW (ref 22–32)
Calcium: 7.6 mg/dL — ABNORMAL LOW (ref 8.9–10.3)
Creatinine, Ser: 1.19 mg/dL (ref 0.61–1.24)
Glucose, Bld: 91 mg/dL (ref 65–99)
POTASSIUM: 3.7 mmol/L (ref 3.5–5.1)
Sodium: 134 mmol/L — ABNORMAL LOW (ref 135–145)
TOTAL PROTEIN: 7.3 g/dL (ref 6.5–8.1)
Total Bilirubin: 0.6 mg/dL (ref 0.3–1.2)

## 2016-09-13 LAB — CULTURE, BLOOD (ROUTINE X 2)
CULTURE: NO GROWTH
CULTURE: NO GROWTH

## 2016-09-13 LAB — CBC
HCT: 26.7 % — ABNORMAL LOW (ref 39.0–52.0)
Hemoglobin: 8.7 g/dL — ABNORMAL LOW (ref 13.0–17.0)
MCH: 26.4 pg (ref 26.0–34.0)
MCHC: 32.6 g/dL (ref 30.0–36.0)
MCV: 81.2 fL (ref 78.0–100.0)
PLATELETS: 835 10*3/uL — AB (ref 150–400)
RBC: 3.29 MIL/uL — ABNORMAL LOW (ref 4.22–5.81)
RDW: 15 % (ref 11.5–15.5)
WBC: 17.1 10*3/uL — ABNORMAL HIGH (ref 4.0–10.5)

## 2016-09-13 LAB — TYPE AND SCREEN
ABO/RH(D): B POS
Antibody Screen: NEGATIVE
Unit division: 0
Unit division: 0

## 2016-09-13 LAB — GLUCOSE, CAPILLARY
GLUCOSE-CAPILLARY: 94 mg/dL (ref 65–99)
Glucose-Capillary: 106 mg/dL — ABNORMAL HIGH (ref 65–99)
Glucose-Capillary: 88 mg/dL (ref 65–99)
Glucose-Capillary: 95 mg/dL (ref 65–99)

## 2016-09-13 MED ORDER — VANCOMYCIN HCL IN DEXTROSE 1-5 GM/200ML-% IV SOLN
1000.0000 mg | INTRAVENOUS | Status: DC
  Administered 2016-09-13 – 2016-09-15 (×3): 1000 mg via INTRAVENOUS
  Filled 2016-09-13 (×3): qty 200

## 2016-09-13 NOTE — Evaluation (Signed)
Physical Therapy Evaluation Patient Details Name: Todd [name] MRN: [id number] DOB: 08/07/1955 Today's Date: 09/13/2016   History of Present Illness  61 year old male with PMH of DM with peripheral neuropathy, PAD, HTN, with recent bilateral BKA (Left 3/2 and Right 3/5) secondary to ischemia with gangrenous changes. Presents to ED on 3/20 from SNF with reported 24 hours of hypotension, AMS, AKI and hyperkalemia.  Clinical Impression  Patient is s/p above surgery resulting in functional limitations due to the deficits listed below (see PT Problem List). Pt requires modA for bed mobility to decrease friction on ischial tuberosities with movement. Pt to work on ant/post transfers to recliner. Patient will benefit from skilled PT to increase their independence and safety with mobility to allow discharge to the venue listed below.      Follow Up Recommendations SNF    Equipment Recommendations   (TBD at next venue)    Recommendations for Other Services OT consult     Precautions / Restrictions Restrictions Weight Bearing Restrictions: No RLE Weight Bearing: Non weight bearing LLE Weight Bearing: Non weight bearing      Mobility  Bed Mobility Overal bed mobility: Needs Assistance Bed Mobility: Supine to Sit     Supine to sit: Mod assist;HOB elevated     General bed mobility comments: Pt able to come EoB with Mod A for rotating hips around using pad to minimize friction on posterior      Balance   Sitting-balance support: Bilateral upper extremity supported;Single extremity supported Sitting balance-Leahy Scale: Fair Sitting balance - Comments: Pt able to keep himself upright to eat lunch EoB                                     Pertinent Vitals/Pain Pain Assessment: 0-10 Pain Score: 3  Pain Location: B LE Pain Descriptors / Indicators: Grimacing;Guarding;Operative site guarding  VSS    Home Living Family/patient expects to be discharged to:: Skilled  nursing facility                      Prior Function Level of Independence: Needs assistance   Gait / Transfers Assistance Needed: Pt came from SNF where he was rehabbing from bilateral BKA  ADL's / Homemaking Assistance Needed: Pt came from SNF where he was rehabbing from B BKA and need assistance with ADLs        Hand Dominance        Extremity/Trunk Assessment   Upper Extremity Assessment Upper Extremity Assessment: Defer to OT evaluation    Lower Extremity Assessment RLE: Unable to fully assess due to pain LLE Deficits / Details: knee lacks full extension       Communication   Communication: No difficulties  Cognition Arousal/Alertness: Awake/alert Behavior During Therapy: WFL for tasks assessed/performed Overall Cognitive Status: Within Functional Limits for tasks assessed                                        General Comments      Exercises     Assessment/Plan    PT Assessment Patient needs continued PT services  PT Problem List Decreased strength;Decreased range of motion;Decreased activity tolerance;Pain;Decreased mobility       PT Treatment Interventions DME instruction;Functional mobility training;Therapeutic activities;Therapeutic exercise;Balance training;Patient/family education;Wheelchair mobility training    PT Goals (  Current goals can be found in the Care Plan section)  Acute Rehab PT Goals Patient Stated Goal: to go home PT Goal Formulation: With patient/family Time For Goal Achievement: 09/20/16 Potential to Achieve Goals: Fair    Frequency Min 3X/week   Barriers to discharge        Co-evaluation               End of Session   Activity Tolerance: Patient limited by fatigue Patient left: in bed;with call bell/phone within reach;with family/visitor present Nurse Communication: Mobility status PT Visit Diagnosis: Other abnormalities of gait and mobility (R26.89);Pain Pain - Right/Left: Right  (bilateral) Pain - part of body: Leg    Time: 4132-4401 PT Time Calculation (min) (ACUTE ONLY): 44 min   Charges:   PT Evaluation $PT Eval Moderate Complexity: 1 Procedure PT Treatments $Therapeutic Activity: 23-37 mins   PT G Codes:        Lashaun Poch B. Migdalia Dk PT, DPT Acute Rehabilitation  [phone] Pager [phone]   [place] 09/13/2016, 1:54 PM

## 2016-09-13 NOTE — Progress Notes (Signed)
Pharmacy Antibiotic Note  Todd [name] is a 61 y.o. male admitted on 09/08/2016 with sepsis.  Pharmacy has been consulted for vancomycin/zosyn dosing. Tmax is 101 and WBC is elevated at 17.1 but decreased significantly. S/p R AKA 4/2. All cultures negative to date.   Plan: Continue Zosyn 3.375 gm IV q8h (4 hour infusion) Change vancomycin to 1gm IV Q24H F/u renal fxn, C&S, clinical status and trough at [facility] Consider de-escalation post-surgery  Weight: 124 lb (56.2 kg)  Temp (24hrs), Avg:99.5 F (37.5 C), Min:98.7 F (37.1 C), Max:101 F (38.3 C)   Recent Labs Lab 09/08/16 1309 09/08/16 1911 09/09/16 0418 09/10/16 0345 09/11/16 0501 09/12/16 0541 09/13/16 0650  WBC  --  24.3* 22.5* 24.8* 30.6*  --  17.1*  CREATININE  --  1.74* 1.48* 1.33* 1.59* 1.42* 1.19  LATICACIDVEN 1.52  --   --   --   --   --   --     Estimated Creatinine Clearance: 51.8 mL/min (by C-G formula based on SCr of 1.19 mg/dL).    Allergies  Allergen Reactions  . Lisinopril Other (See Comments)    Hyperkalemia / Renal failure    Salome Arnt, PharmD, BCPS Pager # [phone] 09/13/2016 10:26 AM

## 2016-09-13 NOTE — Progress Notes (Signed)
Patient ID: Todd [name], male   DOB: 1956/03/22, 61 y.o.   MRN: [id number] Postoperative day 1 right above-the-knee amputation. Patient states that he is feeling better than he was prior to surgery wound VAC is functioning well no drainage and the canister. Anticipate discharge to rehabilitation.

## 2016-09-13 NOTE — NC FL2 (Signed)
[place] LEVEL OF CARE SCREENING TOOL     IDENTIFICATION  Patient Name: Todd [name] Birthdate: 1955-08-28 Sex: male Admission Date (Current Location): 09/08/2016  [facility] and [place] Number:  Herbalist and Address:  [facility], [place] [address] [place], [place] [address]      Provider Number: [id number]  Attending Physician Name and Address:  Theodis Blaze, MD  Relative Name and Phone Number:       Current Level of Care: Hospital Recommended Level of Care: [place] Prior Approval Number:    Date Approved/Denied: 09/13/16 PASRR Number: [id number]  Discharge Plan: SNF    Current Diagnoses: Patient Active Problem List   Diagnosis Date Noted  . Right BKA infection ([place])   . Wound infection after surgery 09/08/2016  . Respiratory failure ([place])   . Acute on chronic kidney failure ([place]) 08/30/2016  . Pressure injury of skin 08/15/2016  . Gangrene associated with diabetes mellitus ([place]) 08/08/2016  . Diabetic peripheral neuropathy ([place]) 07/21/2016  . Renal failure 07/04/2016  . Gout 07/04/2016  . Elevated troponin 07/04/2016  . Cellulitis feet due to stasis dermatitis & edema 07/04/2016  . Hyperuricemia 06/21/2016  . HTN (hypertension) 01/05/2016  . Diabetes type 2, uncontrolled ([place]) 01/05/2016  . AKI (acute kidney injury) ([place]) 12/20/2015  . Hypothyroidism 05/10/2011    Orientation RESPIRATION BLADDER Height & Weight     Self, Place, Time, Situation  Normal Continent Weight: 124 lb (56.2 kg) Height:     BEHAVIORAL SYMPTOMS/MOOD NEUROLOGICAL BOWEL NUTRITION STATUS      Incontinent  (Please see d/c summary)  AMBULATORY STATUS COMMUNICATION OF NEEDS Skin   Extensive Assist Verbally Surgical wounds, PU Stage and Appropriate Care, Wound Vac (Closed incision left leg, guaze dressing, Closed incision right leg.Right knee, negative wound pressure therapy)   PU Stage 2 Dressing:  (Rght Buttocks foam  dressing (PRN))                   Personal Care Assistance Level of Assistance  Bathing, Feeding, Dressing Bathing Assistance: Maximum assistance Feeding assistance: Limited assistance Dressing Assistance: Maximum assistance     Functional Limitations Info  Sight, Hearing, Speech Sight Info: Adequate Hearing Info: Adequate Speech Info: Adequate    SPECIAL CARE FACTORS FREQUENCY  PT (By licensed PT), OT (By licensed OT)     PT Frequency: 3x OT Frequency: 3x            Contractures Contractures Info: Not present    Additional Factors Info  Code Status, Allergies, Isolation Precautions Code Status Info: Full Code Allergies Info: Lisinopril     Isolation Precautions Info: Contact, MRSA     Current Medications (09/13/2016):  This is the current hospital active medication list Current Facility-Administered Medications  Medication Dose Route Frequency Provider Last Rate Last Dose  . 0.9 %  sodium chloride infusion   Intravenous Continuous [name] V, MD      . acetaminophen (TYLENOL) tablet 650 mg  650 mg Oral Q6H PRN Newt Minion, MD   650 mg at 09/13/16 1127   Or  . acetaminophen (TYLENOL) suppository 650 mg  650 mg Rectal Q6H PRN Newt Minion, MD      . aspirin EC tablet 325 mg  325 mg Oral Daily Elwin Mocha, MD   325 mg at 09/13/16 1047  . atorvastatin (LIPITOR) tablet 40 mg  40 mg Oral q1800 Elwin Mocha, MD   40 mg  at 09/11/16 1739  . bisacodyl (DULCOLAX) suppository 10 mg  10 mg Rectal Daily PRN [name] V, MD      . carvedilol (COREG) tablet 3.125 mg  3.125 mg Oral BID WC Elwin Mocha, MD   3.125 mg at 09/13/16 0835  . colchicine tablet 0.6 mg  0.6 mg Oral BID Elwin Mocha, MD   0.6 mg at 09/13/16 1049  . collagenase (SANTYL) ointment   Topical Daily Theodis Blaze, MD      . diphenhydrAMINE (BENADRYL) capsule 25 mg  25 mg Oral Q6H PRN Ritta Slot, NP   25 mg at 09/10/16 1002  . feeding supplement (ENSURE ENLIVE) (ENSURE ENLIVE) liquid 237 mL   237 mL Oral BID BM Theodis Blaze, MD      . feeding supplement (GLUCERNA SHAKE) (GLUCERNA SHAKE) liquid 237 mL  237 mL Oral BID BM Elwin Mocha, MD   237 mL at 09/12/16 0858  . heparin injection 5,000 Units  5,000 Units Subcutaneous Q8H Elwin Mocha, MD   5,000 Units at 09/13/16 1426  . hydrALAZINE (APRESOLINE) injection 10 mg  10 mg Intravenous Q8H PRN Elwin Mocha, MD      . HYDROmorphone (DILAUDID) injection 1 mg  1 mg Intravenous Q2H PRN Newt Minion, MD   1 mg at 09/13/16 0339  . insulin aspart (novoLOG) injection 0-15 Units  0-15 Units Subcutaneous TID WC Elwin Mocha, MD   2 Units at 09/09/16 1338  . insulin aspart (novoLOG) injection 4 Units  4 Units Subcutaneous TID WC Elwin Mocha, MD   4 Units at 09/13/16 [phone]  . insulin glargine (LANTUS) injection 10 Units  10 Units Subcutaneous Daily Elwin Mocha, MD   10 Units at 09/13/16 1050  . lactated ringers infusion   Intravenous Continuous Rica Koyanagi, MD 10 mL/hr at 09/12/16 1548    . levothyroxine (SYNTHROID, LEVOTHROID) tablet 100 mcg  100 mcg Oral QAC breakfast Elwin Mocha, MD   100 mcg at 09/13/16 0835  . loperamide (IMODIUM) capsule 4 mg  4 mg Oral PRN Elwin Mocha, MD   4 mg at 09/11/16 0856  . methocarbamol (ROBAXIN) tablet 500 mg  500 mg Oral Q6H PRN Newt Minion, MD   500 mg at 09/13/16 1052   Or  . methocarbamol (ROBAXIN) 500 mg in dextrose 5 % 50 mL IVPB  500 mg Intravenous Q6H PRN Newt Minion, MD      . metoCLOPramide (REGLAN) tablet 5-10 mg  5-10 mg Oral Q8H PRN Newt Minion, MD       Or  . metoCLOPramide (REGLAN) injection 5-10 mg  5-10 mg Intravenous Q8H PRN Newt Minion, MD      . mupirocin cream (BACTROBAN) 2 %   Topical BID Ritta Slot, NP      . ondansetron [facility]) tablet 4 mg  4 mg Oral Q6H PRN Newt Minion, MD       Or  . ondansetron [facility]) injection 4 mg  4 mg Intravenous Q6H PRN [name] V, MD      . oxyCODONE (Oxy IR/ROXICODONE) immediate release tablet 5-10 mg  5-10 mg Oral  Q3H PRN Newt Minion, MD   10 mg at 09/13/16 1512  . oxyCODONE-acetaminophen (PERCOCET/ROXICET) 5-325 MG per tablet 1-2 tablet  1-2 tablet Oral Q3H PRN Theodis Blaze, MD   2 tablet at 09/11/16 2314  . piperacillin-tazobactam (ZOSYN) IVPB 3.375 g  3.375  g Intravenous Q8H Romona Curls, RPH   3.375 g at 09/13/16 1503  . pregabalin (LYRICA) capsule 200 mg  200 mg Oral Daily Elwin Mocha, MD   200 mg at 09/13/16 1051  . tamsulosin (FLOMAX) capsule 0.4 mg  0.4 mg Oral Daily Elwin Mocha, MD   0.4 mg at 09/13/16 1051  . vancomycin (VANCOCIN) IVPB 1000 mg/200 mL premix  1,000 mg Intravenous Q24H Valeda Malm Rumbarger, RPH   1,000 mg at 09/13/16 1223     Discharge Medications: Please see discharge summary for a list of discharge medications.  Relevant Imaging Results:  Relevant Lab Results:   Additional Information SSN: [id number]  Eileen Stanford, LCSW

## 2016-09-13 NOTE — Clinical Social Work Note (Signed)
CSW got a call from pt's sister in order to discuss placement options. Pt's sister states pt will go to [facility]. Pt's sister reports she has been speaking with Pam. Pt's sister provided CSW will Pams contact information. CSW spoke with Pam however, there is a bit of confusion. Pt's sister misunderstood. Pt needs SNF placement not outpatient rehab. Pam spoke with CSW and explained they are a inpatient rehab center and are unable to take pt at d/c. Pam also explained this to pt's sister. CSW spoke with pt's sister and pt's sister now understands. Pt's sister is agreeable to a [facility] f/o. CSW will provide pt's sister with b/o once available.   [place], [place]

## 2016-09-13 NOTE — Progress Notes (Addendum)
Patient ID: Todd [name], male   DOB: 1955-12-12, 61 y.o.   MRN: [id number]   PROGRESS NOTE  Dragon Thrush  WUJ:[id number] DOB: [birth year] DOA: 09/08/2016  PCP: [name], MD   Brief Narrative:  61 y.o. male with known gout, hypertension, hyperlipidemia and recent hospitalization with right BKA. He presented with bilateral leg pain. States the pain has been worsening throbbing for last 3 days. Rates pain as 4-10. Has been taking outpatient narcotics with little to no effect.   Assessment & Plan:   Sepsis secondary to Wound infection R BKA - pt met criteria for sepsis on admission, T 101.5, WBC 25, HR 90 - pt reported some purulent drainage from R BKA - pt is currently post op day #27 s/p R BKA  - currently post op day #1, s/p R AKA, by Dr. Sharol Given - pt reports feeling better, wound VAC is functioning well no drainage and the canister - continue vanc and zosyn day #6, since pt still with T 101 F, will plan to de escalate in next 24 hours, depending clinical progress, WBC is trending down form 30 --> 17 K this AM  - repeat CBC in AM and monitor VS   CKD stage III - about 8 days ago Cr was as high as 2.1 and on admission Cr 2 - Cr trending down and is WNL this AM  - BMP in AM  Chronic systolic dysfunction - Echocardiogram 3/23 read as EF 45-50% with basal midinferior myocardial hypokinesis.  - There is no evidence of ACS with unchanged ECG, no angina. - no chest pain this AM  - weight trend since admission  Filed Weights   09/11/16 1300 09/13/16 0500  Weight: 58.5 kg (129 lb) 56.2 kg (124 lb)   Transaminitis - unclear etiology - RUQ with no acute findings  - LFT's trending down and almost in normal range  - CMET in AM  CAD - Daily aspirin  Malnutrition, moderate  - nutritionist consulted   BPH - Cont flomax  Chronic diarrhea - check stool panel, C. Diff negative- stop stool softeners, pt has been on miralax, colace, bisacodyl  - allow imodium as needed    Gout - Continue: Colcrys bid  Hypothyroidism - continue synthroid  Hyperkalemia - resolved   Anemia of chronic disease  - drop in Hg noted since admission but no evidence of active bleeding  - Hg is overall stable: 7.2 --> 8.7 this AM  - CBC in AM  Foley cath in place - asked RN to replace or do In/Out cath   Penile infection - pus noted around  - current ABX should be adequate   Thrombocytosis - reactive, trending down post op  - CBC in AM   Hypertension, essential  - continue Coreg  - reasonable control for now   DM type II with complications of nephropathy and PVD - continue lantus and SSI   Hyperlipidemia - Continue statin  DVT prophylaxis: Heparin SQ Code Status: Full  Family Communication: Patient at bedside, no family at bedside this am, sister last updated 4/2 Disposition Plan: to be determined, likely SNF in 1-2 days   Consultants:   Vascular surgery   Dr. Sharol Given  Procedures:   None  Antimicrobials:   Vancomycin 3/29 -->  Zosyn 3/29 -->   Subjective: No events overnight except for persistent diarrhea but overall better.  Objective: Vitals:   09/12/16 1851 09/12/16 2300 09/13/16 0500 09/13/16 0942  BP: 127/69 121/66  111/71  Pulse: 77 76  78  Resp: 18 18    Temp: 99.2 F (37.3 C) 99.5 F (37.5 C)  (!) 101 F (38.3 C)  TempSrc: Oral Oral  Oral  SpO2: 100% 98%  100%  Weight:   56.2 kg (124 lb)     Intake/Output Summary (Last 24 hours) at 09/13/16 1051 Last data filed at 09/13/16 0200  Gross per 24 hour  Intake             1077 ml  Output              101 ml  Net              976 ml   Examination:  General exam: Appears calm and comfortable  Respiratory system: Clear to auscultation. Respiratory effort normal. Cardiovascular system: S1 & S2 heard, RRR. No rubs, gallops or clicks.  Gastrointestinal system: Abdomen is nondistended, soft and nontender. No organomegaly or masses felt.. Central nervous system: Alert and  oriented.  Extremities: R AKA  Data Reviewed: I have personally reviewed following labs and imaging studies  CBC:  Recent Labs Lab 09/08/16 1259 09/08/16 1911 09/09/16 0418 09/10/16 0345 09/11/16 0501 09/13/16 0650  WBC 25.1* 24.3* 22.5* 24.8* 30.6* 17.1*  NEUTROABS 20.2*  --   --   --   --   --   HGB 8.6* 7.9* 7.4* 7.7* 7.2* 8.7*  HCT 26.1* 25.1* 22.9* 24.2* 22.8* 26.7*  MCV 80.3 81.2 80.4 81.2 81.4 81.2  PLT 759* 858* 775* 847* 890* 194*   Basic Metabolic Panel:  Recent Labs Lab 09/09/16 0418 09/10/16 0345 09/11/16 0501 09/12/16 0541 09/13/16 0650  NA 135 135 133* 134* 134*  K 4.0 3.8 4.1 3.8 3.7  CL 108 108 104 105 107  CO2 15* 17* 19* 21* 20*  GLUCOSE 111* 69 108* 82 91  BUN 29* _0 CREATININE 1.48* 1.33* 1.59* 1.42* 1.19  CALCIUM 7.5* 7.6* 7.9* 7.9* 7.6*  MG 1.8  --   --   --   --   PHOS 2.9  --   --   --   --    Liver Function Tests:  Recent Labs Lab 09/08/16 1259 09/10/16 0345 09/13/16 0650  AST 163* 58* 37  ALT 103* 59 31  ALKPHOS 274* 263* 188*  BILITOT 1.5* 0.6 0.6  PROT 8.7* 7.2 7.3  ALBUMIN 2.0* 1.6* 1.6*   Coagulation Profile:  Recent Labs Lab 09/09/16 0418  INR 1.24   CBG:  Recent Labs Lab 09/12/16 0739 09/12/16 1119 09/12/16 1517 09/12/16 1747 09/13/16 0746  GLUCAP 60* 77 89 84 106*   Urine analysis:    Component Value Date/Time   COLORURINE YELLOW 09/08/2016 1306   APPEARANCEUR HAZY (A) 09/08/2016 1306   APPEARANCEUR Clear 08/05/2016 1630   LABSPEC 1.019 09/08/2016 1306   PHURINE 5.0 09/08/2016 1306   GLUCOSEU NEGATIVE 09/08/2016 1306   HGBUR NEGATIVE 09/08/2016 1306   BILIRUBINUR NEGATIVE 09/08/2016 1306   BILIRUBINUR Negative 08/05/2016 1630   KETONESUR NEGATIVE 09/08/2016 1306   PROTEINUR 30 (A) 09/08/2016 1306   NITRITE NEGATIVE 09/08/2016 1306   LEUKOCYTESUR NEGATIVE 09/08/2016 1306   LEUKOCYTESUR Negative 08/05/2016 1630   Recent Results (from the past 240 hour(s))  Urine culture     Status:  None   Collection Time: 09/08/16 12:59 PM  Result Value Ref Range Status   Specimen Description URINE, CATHETERIZED  Final   Special Requests NONE  Final   Culture NO GROWTH  Final   Report  Status 09/09/2016 FINAL  Final  Blood Culture (routine x 2)     Status: None (Preliminary result)   Collection Time: 09/08/16  1:15 PM  Result Value Ref Range Status   Specimen Description BLOOD RIGHT HAND  Final   Special Requests BOTTLES DRAWN AEROBIC AND ANAEROBIC 5CC  Final   Culture NO GROWTH 4 DAYS  Final   Report Status PENDING  Incomplete  Blood Culture (routine x 2)     Status: None (Preliminary result)   Collection Time: 09/08/16  1:15 PM  Result Value Ref Range Status   Specimen Description BLOOD RIGHT FOREARM  Final   Special Requests BOTTLES DRAWN AEROBIC AND ANAEROBIC 5CC  Final   Culture NO GROWTH 4 DAYS  Final   Report Status PENDING  Incomplete  Gastrointestinal Panel by PCR , Stool     Status: None   Collection Time: 09/09/16  2:01 PM  Result Value Ref Range Status   Campylobacter species NOT DETECTED NOT DETECTED Final   Plesimonas shigelloides NOT DETECTED NOT DETECTED Final   Salmonella species NOT DETECTED NOT DETECTED Final   Yersinia enterocolitica NOT DETECTED NOT DETECTED Final   Vibrio species NOT DETECTED NOT DETECTED Final   Vibrio cholerae NOT DETECTED NOT DETECTED Final   Enteroaggregative E coli (EAEC) NOT DETECTED NOT DETECTED Final   Enteropathogenic E coli (EPEC) NOT DETECTED NOT DETECTED Final   Enterotoxigenic E coli (ETEC) NOT DETECTED NOT DETECTED Final   Shiga like toxin producing E coli (STEC) NOT DETECTED NOT DETECTED Final   Shigella/Enteroinvasive E coli (EIEC) NOT DETECTED NOT DETECTED Final   Cryptosporidium NOT DETECTED NOT DETECTED Final   Cyclospora cayetanensis NOT DETECTED NOT DETECTED Final   Entamoeba histolytica NOT DETECTED NOT DETECTED Final   Giardia lamblia NOT DETECTED NOT DETECTED Final   Adenovirus F40/41 NOT DETECTED NOT DETECTED  Final   Astrovirus NOT DETECTED NOT DETECTED Final   Norovirus GI/GII NOT DETECTED NOT DETECTED Final   Rotavirus A NOT DETECTED NOT DETECTED Final   Sapovirus (I, II, IV, and V) NOT DETECTED NOT DETECTED Final  C difficile quick scan w PCR reflex     Status: None   Collection Time: 09/09/16  2:01 PM  Result Value Ref Range Status   C Diff antigen NEGATIVE NEGATIVE Final   C Diff toxin NEGATIVE NEGATIVE Final   C Diff interpretation No C. difficile detected.  Final  MRSA PCR Screening     Status: Abnormal   Collection Time: 09/10/16  4:45 PM  Result Value Ref Range Status   MRSA by PCR POSITIVE (A) NEGATIVE Final    Comment:        The GeneXpert MRSA Assay (FDA approved for NASAL specimens only), is one component of a comprehensive MRSA colonization surveillance program. It is not intended to diagnose MRSA infection nor to guide or monitor treatment for MRSA infections. RESULT CALLED TO, READ BACK BY AND VERIFIED WITH: C WHITESIDE,RN AT 1923 09/10/16 BY L BENFIELD     Radiology Studies: No results found. Scheduled Meds: . aspirin  325 mg Oral Daily  . atorvastatin  40 mg Oral q1800  . carvedilol  3.125 mg Oral BID WC  . colchicine  0.6 mg Oral BID  . collagenase   Topical Daily  . docusate sodium  100 mg Oral BID  . feeding supplement (ENSURE ENLIVE)  237 mL Oral BID BM  . feeding supplement (GLUCERNA SHAKE)  237 mL Oral BID BM  . heparin  5,000 Units  Subcutaneous Q8H  . insulin aspart  0-15 Units Subcutaneous TID WC  . insulin aspart  4 Units Subcutaneous TID WC  . insulin glargine  10 Units Subcutaneous Daily  . levothyroxine  100 mcg Oral QAC breakfast  . mupirocin cream   Topical BID  . piperacillin-tazobactam (ZOSYN)  IV  3.375 g Intravenous Q8H  . pregabalin  200 mg Oral Daily  . tamsulosin  0.4 mg Oral Daily  . vancomycin  1,000 mg Intravenous Q24H   Continuous Infusions: . sodium chloride    . lactated ringers 10 mL/hr at 09/12/16 1548     LOS: 5 days    Time spent: 20 minutes   Faye Ramsay, MD [facility] Pager [phone]  If 7PM-7AM, please contact night-coverage [email or web address] Password [email or web address] 09/13/2016, 10:51 AM

## 2016-09-14 ENCOUNTER — Telehealth: Payer: Self-pay | Admitting: Family Medicine

## 2016-09-14 LAB — C DIFFICILE QUICK SCREEN W PCR REFLEX
C DIFFICILE (CDIFF) TOXIN: NEGATIVE
C Diff antigen: NEGATIVE
C Diff interpretation: NOT DETECTED

## 2016-09-14 LAB — CBC
HCT: 25.8 % — ABNORMAL LOW (ref 39.0–52.0)
Hemoglobin: 8.3 g/dL — ABNORMAL LOW (ref 13.0–17.0)
MCH: 26.1 pg (ref 26.0–34.0)
MCHC: 32.2 g/dL (ref 30.0–36.0)
MCV: 81.1 fL (ref 78.0–100.0)
PLATELETS: 808 10*3/uL — AB (ref 150–400)
RBC: 3.18 MIL/uL — ABNORMAL LOW (ref 4.22–5.81)
RDW: 14.9 % (ref 11.5–15.5)
WBC: 18.1 10*3/uL — ABNORMAL HIGH (ref 4.0–10.5)

## 2016-09-14 LAB — COMPREHENSIVE METABOLIC PANEL
ALK PHOS: 227 U/L — AB (ref 38–126)
ALT: 42 U/L (ref 17–63)
ANION GAP: 8 (ref 5–15)
AST: 70 U/L — ABNORMAL HIGH (ref 15–41)
Albumin: 1.5 g/dL — ABNORMAL LOW (ref 3.5–5.0)
BUN: 9 mg/dL (ref 6–20)
CALCIUM: 7.6 mg/dL — AB (ref 8.9–10.3)
CHLORIDE: 106 mmol/L (ref 101–111)
CO2: 22 mmol/L (ref 22–32)
CREATININE: 1.11 mg/dL (ref 0.61–1.24)
Glucose, Bld: 95 mg/dL (ref 65–99)
Potassium: 3.5 mmol/L (ref 3.5–5.1)
SODIUM: 136 mmol/L (ref 135–145)
Total Bilirubin: 0.8 mg/dL (ref 0.3–1.2)
Total Protein: 7.1 g/dL (ref 6.5–8.1)

## 2016-09-14 LAB — GLUCOSE, CAPILLARY
GLUCOSE-CAPILLARY: 76 mg/dL (ref 65–99)
Glucose-Capillary: 101 mg/dL — ABNORMAL HIGH (ref 65–99)
Glucose-Capillary: 107 mg/dL — ABNORMAL HIGH (ref 65–99)
Glucose-Capillary: 148 mg/dL — ABNORMAL HIGH (ref 65–99)

## 2016-09-14 NOTE — Anesthesia Postprocedure Evaluation (Addendum)
Anesthesia Post Note  Patient: Todd [name]  Procedure(s) Performed: Procedure(s) (LRB): AMPUTATION ABOVE KNEE (Right) APPLICATION OF WOUND VAC ABOVE KNEE (Right)  Patient location during evaluation: PACU Anesthesia Type: General Level of consciousness: awake and sedated Pain management: pain level controlled Vital Signs Assessment: post-procedure vital signs reviewed and stable Respiratory status: spontaneous breathing, nonlabored ventilation, respiratory function stable and patient connected to nasal cannula oxygen Cardiovascular status: blood pressure returned to baseline and stable Postop Assessment: no signs of nausea or vomiting Anesthetic complications: no       Last Vitals:  Vitals:   09/13/16 2110 09/14/16 0539  BP: 102/63 122/64  Pulse: 75 65  Resp: 16 16  Temp: 36.7 C 36.6 C    Last Pain:  Vitals:   09/14/16 0649  TempSrc:   PainSc: 4                  Gerrett Loman,JAMES TERRILL

## 2016-09-14 NOTE — Clinical Social Work Placement (Signed)
   CLINICAL SOCIAL WORK PLACEMENT  NOTE  Date:  09/14/2016  Patient Details  Name: Todd [name] MRN: [id number] Date of Birth: [birth year]  Clinical Social Work is seeking post-discharge placement for this patient at the [place] level of care (*CSW will initial, date and re-position this form in  chart as items are completed):      Patient/family provided with [facility] Work Department's list of facilities offering this level of care within the geographic area requested by the patient (or if unable, by the patient's family).  Yes   Patient/family informed of their freedom to choose among providers that offer the needed level of care, that participate in Medicare, Medicaid or managed care program needed by the patient, have an available bed and are willing to accept the patient.      Patient/family informed of [place]'s ownership interest in [facility] and [facility], as well as of the fact that they are under no obligation to receive care at these facilities.  PASRR submitted to EDS on       PASRR number received on 09/13/16     Existing PASRR number confirmed on       FL2 transmitted to all facilities in geographic area requested by pt/family on 09/13/16     FL2 transmitted to all facilities within larger geographic area on       Patient informed that his/her managed care company has contracts with or will negotiate with certain facilities, including the following:        Yes   Patient/family informed of bed offers received.  Patient chooses bed at [facility]     Physician recommends and patient chooses bed at      Patient to be transferred to [facility] on 09/15/16.  Patient to be transferred to facility by [facility]     Patient family notified on 09/15/16 of transfer.  Name of family member notified:        PHYSICIAN Please prepare priority discharge summary, including medications, Please prepare prescriptions      Additional Comment:    _______________________________________________ Eileen Stanford, LCSW 09/14/2016, 3:29 PM

## 2016-09-14 NOTE — Progress Notes (Signed)
Patient ID: Todd [name], male   DOB: 12-11-1955, 61 y.o.   MRN: [id number]   PROGRESS NOTE  Kohler Pellerito  ZJQ:[id number] DOB: 1955/12/19 DOA: 09/08/2016  PCP: [name], MD   Brief Narrative:  61 y.o. male with known gout, hypertension, hyperlipidemia and recent hospitalization with right BKA. He presented with bilateral leg pain. States the pain has been worsening throbbing for last 3 days. Rates pain as 4-10. Has been taking outpatient narcotics with little to no effect.   Assessment & Plan:   Sepsis secondary to Wound infection R BKA - pt met criteria for sepsis on admission, T 101.5, WBC 25, HR 90 - pt reported some purulent drainage from R BKA - pt is currently post op day #28 s/p R BKA  - currently post op day #2, s/p R AKA, by Dr. Sharol Given - pt reports feeling better, wound VAC is functioning well no drainage and the canister - continue vanc and zosyn day #7, d/w ortho if OK to stop ABX at this point - repeat CBC in AM and monitor VS   CKD stage III - about 8 days ago Cr was as high as 2.1 and on admission Cr 2 - Cr trending down and is WNL this AM  - BMP in AM  Chronic systolic dysfunction - Echocardiogram 3/23 read as EF 45-50% with basal midinferior myocardial hypokinesis.  - There is no evidence of ACS with unchanged ECG, no angina. - no chest pain this AM  - weight trend since admission  Filed Weights   09/11/16 1300 09/13/16 0500  Weight: 58.5 kg (129 lb) 56.2 kg (124 lb)   Transaminitis - unclear etiology - RUQ with no acute findings  - LFT's trending down overall  - CMET in AM  CAD - Daily aspirin  Malnutrition, moderate  - nutritionist consulted, appreciate assistance   BPH - Cont flomax  Chronic diarrhea - C. Diff negative- stopped stool softeners, pt has been on miralax, colace, bisacodyl  - allow imodium as needed   Gout - Continue: Colcrys bid  Hypothyroidism - continue synthroid  Hyperkalemia - resolved   Anemia of  chronic disease  - drop in Hg noted since admission but no evidence of active bleeding  - Hg is overall stable: 7.2 --> 8.7 --> 8.3 this AM  - CBC in AM  Foley cath in place - asked RN to replace or do In/Out cath   Penile infection - pus noted around but overall much better  - current ABX should be adequate   Thrombocytosis - reactive, trending down post op  - CBC in AM   Hypertension, essential  - continue Coreg  - reasonable control for now   DM type II with complications of nephropathy and PVD - continue lantus and SSI   Hyperlipidemia - Continue statin  DVT prophylaxis: Heparin SQ Code Status: Full  Family Communication: Patient at bedside, no family at bedside this am, sister last updated 4/2 Disposition Plan: to be determined, likely SNF in 1-2 days   Consultants:   Vascular surgery   Dr. Sharol Given  Procedures:   None  Antimicrobials:   Vancomycin 3/29 -->  Zosyn 3/29 -->   Subjective: No events overnight, pt reports feeling better overall.   Objective: Vitals:   09/13/16 1217 09/13/16 1300 09/13/16 2110 09/14/16 0539  BP:  104/66 102/63 122/64  Pulse:  71 75 65  Resp:  16 16 16   Temp: 98.8 F (37.1 C) 98.3 F (36.8 C) 98 F (36.7 C)  97.9 F (36.6 C)  TempSrc: Oral  Oral   SpO2:  100% 100% 100%  Weight:        Intake/Output Summary (Last 24 hours) at 09/14/16 0934 Last data filed at 09/14/16 0700  Gross per 24 hour  Intake              460 ml  Output              800 ml  Net             -340 ml   Examination:  General exam: Appears calm and comfortable  Respiratory system: Clear to auscultation. Respiratory effort normal. Cardiovascular system: S1 & S2 heard, RRR. No rubs, gallops or clicks.  Gastrointestinal system: Abdomen is nondistended, soft and nontender. No organomegaly or masses felt.. Central nervous system: Alert and oriented.  Extremities: R AKA  Data Reviewed: I have personally reviewed following labs and imaging  studies  CBC:  Recent Labs Lab 09/08/16 1259  09/09/16 0418 09/10/16 0345 09/11/16 0501 09/13/16 0650 09/14/16 0550  WBC 25.1*  < > 22.5* 24.8* 30.6* 17.1* 18.1*  NEUTROABS 20.2*  --   --   --   --   --   --   HGB 8.6*  < > 7.4* 7.7* 7.2* 8.7* 8.3*  HCT 26.1*  < > 22.9* 24.2* 22.8* 26.7* 25.8*  MCV 80.3  < > 80.4 81.2 81.4 81.2 81.1  PLT 759*  < > 775* 847* 890* 835* 808*  < > = values in this interval not displayed. Basic Metabolic Panel:  Recent Labs Lab 09/09/16 0418 09/10/16 0345 09/11/16 0501 09/12/16 0541 09/13/16 0650 09/14/16 0550  NA 135 135 133* 134* 134* 136  K 4.0 3.8 4.1 3.8 3.7 3.5  CL 108 108 104 105 107 106  CO2 15* 17* 19* 21* 20* 22  GLUCOSE 111* 69 108* 82 91 95  BUN 29* 17 15 13 10 9   CREATININE 1.48* 1.33* 1.59* 1.42* 1.19 1.11  CALCIUM 7.5* 7.6* 7.9* 7.9* 7.6* 7.6*  MG 1.8  --   --   --   --   --   PHOS 2.9  --   --   --   --   --    Liver Function Tests:  Recent Labs Lab 09/08/16 1259 09/10/16 0345 09/13/16 0650 09/14/16 0550  AST 163* 58* 37 70*  ALT 103* 59 31 42  ALKPHOS 274* 263* 188* 227*  BILITOT 1.5* 0.6 0.6 0.8  PROT 8.7* 7.2 7.3 7.1  ALBUMIN 2.0* 1.6* 1.6* 1.5*   Coagulation Profile:  Recent Labs Lab 09/09/16 0418  INR 1.24   CBG:  Recent Labs Lab 09/13/16 0746 09/13/16 1159 09/13/16 1733 09/13/16 2155 09/14/16 0648  GLUCAP 106* 88 94 95 101*   Urine analysis:    Component Value Date/Time   COLORURINE YELLOW 09/08/2016 1306   APPEARANCEUR HAZY (A) 09/08/2016 1306   APPEARANCEUR Clear 08/05/2016 1630   LABSPEC 1.019 09/08/2016 1306   PHURINE 5.0 09/08/2016 1306   GLUCOSEU NEGATIVE 09/08/2016 1306   HGBUR NEGATIVE 09/08/2016 1306   BILIRUBINUR NEGATIVE 09/08/2016 1306   BILIRUBINUR Negative 08/05/2016 1630   KETONESUR NEGATIVE 09/08/2016 1306   PROTEINUR 30 (A) 09/08/2016 1306   NITRITE NEGATIVE 09/08/2016 1306   LEUKOCYTESUR NEGATIVE 09/08/2016 1306   LEUKOCYTESUR Negative 08/05/2016 1630    Recent Results (from the past 240 hour(s))  Urine culture     Status: None   Collection Time: 09/08/16 12:59 PM  Result  Value Ref Range Status   Specimen Description URINE, CATHETERIZED  Final   Special Requests NONE  Final   Culture NO GROWTH  Final   Report Status 09/09/2016 FINAL  Final  Blood Culture (routine x 2)     Status: None   Collection Time: 09/08/16  1:15 PM  Result Value Ref Range Status   Specimen Description BLOOD RIGHT HAND  Final   Special Requests BOTTLES DRAWN AEROBIC AND ANAEROBIC 5CC  Final   Culture NO GROWTH 5 DAYS  Final   Report Status 09/13/2016 FINAL  Final  Blood Culture (routine x 2)     Status: None   Collection Time: 09/08/16  1:15 PM  Result Value Ref Range Status   Specimen Description BLOOD RIGHT FOREARM  Final   Special Requests BOTTLES DRAWN AEROBIC AND ANAEROBIC 5CC  Final   Culture NO GROWTH 5 DAYS  Final   Report Status 09/13/2016 FINAL  Final  Gastrointestinal Panel by PCR , Stool     Status: None   Collection Time: 09/09/16  2:01 PM  Result Value Ref Range Status   Campylobacter species NOT DETECTED NOT DETECTED Final   Plesimonas shigelloides NOT DETECTED NOT DETECTED Final   Salmonella species NOT DETECTED NOT DETECTED Final   Yersinia enterocolitica NOT DETECTED NOT DETECTED Final   Vibrio species NOT DETECTED NOT DETECTED Final   Vibrio cholerae NOT DETECTED NOT DETECTED Final   Enteroaggregative E coli (EAEC) NOT DETECTED NOT DETECTED Final   Enteropathogenic E coli (EPEC) NOT DETECTED NOT DETECTED Final   Enterotoxigenic E coli (ETEC) NOT DETECTED NOT DETECTED Final   Shiga like toxin producing E coli (STEC) NOT DETECTED NOT DETECTED Final   Shigella/Enteroinvasive E coli (EIEC) NOT DETECTED NOT DETECTED Final   Cryptosporidium NOT DETECTED NOT DETECTED Final   Cyclospora cayetanensis NOT DETECTED NOT DETECTED Final   Entamoeba histolytica NOT DETECTED NOT DETECTED Final   Giardia lamblia NOT DETECTED NOT DETECTED Final    Adenovirus F40/41 NOT DETECTED NOT DETECTED Final   Astrovirus NOT DETECTED NOT DETECTED Final   Norovirus GI/GII NOT DETECTED NOT DETECTED Final   Rotavirus A NOT DETECTED NOT DETECTED Final   Sapovirus (I, II, IV, and V) NOT DETECTED NOT DETECTED Final  C difficile quick scan w PCR reflex     Status: None   Collection Time: 09/09/16  2:01 PM  Result Value Ref Range Status   C Diff antigen NEGATIVE NEGATIVE Final   C Diff toxin NEGATIVE NEGATIVE Final   C Diff interpretation No C. difficile detected.  Final  MRSA PCR Screening     Status: Abnormal   Collection Time: 09/10/16  4:45 PM  Result Value Ref Range Status   MRSA by PCR POSITIVE (A) NEGATIVE Final    Radiology Studies: No results found. Scheduled Meds: . aspirin  325 mg Oral Daily  . atorvastatin  40 mg Oral q1800  . carvedilol  3.125 mg Oral BID WC  . colchicine  0.6 mg Oral BID  . collagenase   Topical Daily  . feeding supplement (ENSURE ENLIVE)  237 mL Oral BID BM  . feeding supplement (GLUCERNA SHAKE)  237 mL Oral BID BM  . heparin  5,000 Units Subcutaneous Q8H  . insulin aspart  0-15 Units Subcutaneous TID WC  . insulin aspart  4 Units Subcutaneous TID WC  . insulin glargine  10 Units Subcutaneous Daily  . levothyroxine  100 mcg Oral QAC breakfast  . mupirocin cream   Topical BID  .  piperacillin-tazobactam (ZOSYN)  IV  3.375 g Intravenous Q8H  . pregabalin  200 mg Oral Daily  . tamsulosin  0.4 mg Oral Daily  . vancomycin  1,000 mg Intravenous Q24H   Continuous Infusions: . sodium chloride    . lactated ringers 10 mL/hr at 09/12/16 1548     LOS: 6 days   Time spent: 20 minutes   Faye Ramsay, MD [facility] Pager [phone]  If 7PM-7AM, please contact night-coverage [email or web address] Password [facility] 09/14/2016, 9:34 AM

## 2016-09-15 ENCOUNTER — Inpatient Hospital Stay (INDEPENDENT_AMBULATORY_CARE_PROVIDER_SITE_OTHER): Payer: BLUE CROSS/BLUE SHIELD | Admitting: Orthopedic Surgery

## 2016-09-15 ENCOUNTER — Encounter: Payer: BLUE CROSS/BLUE SHIELD | Admitting: Vascular Surgery

## 2016-09-15 DIAGNOSIS — A419 Sepsis, unspecified organism: Secondary | ICD-10-CM | POA: Diagnosis present

## 2016-09-15 LAB — GLUCOSE, CAPILLARY
GLUCOSE-CAPILLARY: 84 mg/dL (ref 65–99)
GLUCOSE-CAPILLARY: 109 mg/dL — AB (ref 65–99)
GLUCOSE-CAPILLARY: 114 mg/dL — AB (ref 65–99)
GLUCOSE-CAPILLARY: 108 mg/dL — AB (ref 65–99)
Glucose-Capillary: 61 mg/dL — ABNORMAL LOW (ref 65–99)

## 2016-09-15 LAB — BASIC METABOLIC PANEL
ANION GAP: 8 (ref 5–15)
BUN: 6 mg/dL (ref 6–20)
CO2: 19 mmol/L — AB (ref 22–32)
CREATININE: 0.94 mg/dL (ref 0.61–1.24)
Calcium: 7.4 mg/dL — ABNORMAL LOW (ref 8.9–10.3)
Chloride: 109 mmol/L (ref 101–111)
GFR calc non Af Amer: >60 mL/min (ref >60)
Glucose, Bld: 77 mg/dL (ref 65–99)
Potassium: 3.5 mmol/L (ref 3.5–5.1)
Sodium: 136 mmol/L (ref 135–145)

## 2016-09-15 LAB — CBC
HEMATOCRIT: 26.6 % — AB (ref 39.0–52.0)
Hemoglobin: 8.4 g/dL — ABNORMAL LOW (ref 13.0–17.0)
MCH: 25.8 pg — ABNORMAL LOW (ref 26.0–34.0)
MCHC: 31.6 g/dL (ref 30.0–36.0)
MCV: 81.6 fL (ref 78.0–100.0)
PLATELETS: 813 10*3/uL — AB (ref 150–400)
RBC: 3.26 MIL/uL — ABNORMAL LOW (ref 4.22–5.81)
RDW: 15.2 % (ref 11.5–15.5)
WBC: 15.6 10*3/uL — AB (ref 4.0–10.5)

## 2016-09-15 MED ORDER — ADULT MULTIVITAMIN W/MINERALS CH
1.0000 | ORAL_TABLET | Freq: Every day | ORAL | Status: DC
  Administered 2016-09-15 – 2016-09-16 (×2): 1 via ORAL
  Filled 2016-09-15 (×2): qty 1

## 2016-09-15 MED ORDER — PRO-STAT SUGAR FREE PO LIQD
30.0000 mL | Freq: Two times a day (BID) | ORAL | Status: DC
  Administered 2016-09-15 – 2016-09-16 (×2): 30 mL via ORAL
  Filled 2016-09-15 (×2): qty 30

## 2016-09-15 MED ORDER — COLLAGENASE 250 UNIT/GM EX OINT: TOPICAL_OINTMENT | Freq: Every day | CUTANEOUS | 0 refills | Status: DC

## 2016-09-15 MED ORDER — OXYCODONE-ACETAMINOPHEN 5-325 MG PO TABS: 1.0000 | ORAL_TABLET | ORAL | 0 refills | Status: DC | PRN

## 2016-09-15 NOTE — Care Management (Signed)
Per Dr. Sharol Given patient is cleared from Ortho standpoint for discharge. He will go with Prevena Plus to remain intact for 1 week.  Ricki Miller, RN BSN Case Manager [phone]

## 2016-09-15 NOTE — Progress Notes (Signed)
qPhysical Therapy Treatment Patient Details Name: Todd [name] MRN: [id number] DOB: [birth year] Today's Date: 09/15/2016    History of Present Illness 61 year old male with PMH of DM with peripheral neuropathy, PAD, HTN, with recent bilateral BKA (Left 3/2 and Right 3/5) secondary to ischemia with gangrenous changes. Presents to ED on 3/20 from SNF with reported 24 hours of hypotension, AMS, AKI and hyperkalemia.    PT Comments    Pt agreeable to PT this afternoon. Pt bed mobility modA for both supine to sit and sit to supine with care to minimize friction on his posterior with scooting. Pt able to sit EoB 25 minutes for therex and grooming. Pt unable to progress to anterior/posterior transfers die to IV placement in R wrist making it too painful for weightbearing through R UE. Pt requires skilled PT to progress bed mobility and transfers and to continue UE and LE strengthening to improve mobility in his environment.    Follow Up Recommendations  SNF     Equipment Recommendations   (TBD at next venue)    Recommendations for Other Services OT consult     Precautions / Restrictions Restrictions Weight Bearing Restrictions: No RLE Weight Bearing: Non weight bearing LLE Weight Bearing: Non weight bearing    Mobility  Bed Mobility Overal bed mobility: Needs Assistance Bed Mobility: Supine to Sit;Sit to Supine     Supine to sit: Mod assist;HOB elevated Sit to supine: HOB elevated;Mod assist   General bed mobility comments: Pt able to come EoB with Mod A for rotating hips around using pad to minimize friction on posterior     Balance   Sitting-balance support: Bilateral upper extremity supported;Single extremity supported Sitting balance-Leahy Scale: Fair Sitting balance - Comments: Pt sat EoB for 25 minutes for therex, and to brush teeth                                     Cognition Arousal/Alertness: Awake/alert Behavior During Therapy: WFL for tasks  assessed/performed Overall Cognitive Status: Within Functional Limits for tasks assessed                                        Exercises Amputee Exercises Quad Sets: AROM;Both;10 reps;Seated Hip ABduction/ADduction: AROM;Both;10 reps;Seated Knee Extension: AROM;Both;10 reps;Seated Straight Leg Raises: AROM;Both;10 reps;Seated Other Exercises Other Exercises: reaching outside BoS, 5 reps both sides    General Comments General comments (skin integrity, edema, etc.): Pt IV placement in R wrist makes it too painful to practice weightbearing through UE to start scooting in prep for ant/post transfer to recliner.       Pertinent Vitals/Pain Pain Assessment: 0-10 Pain Score: 4  Pain Location: B LE Pain Descriptors / Indicators: Grimacing;Guarding;Operative site guarding Pain Intervention(s): Limited activity within patient's tolerance;Monitored during session        VSS          PT Goals (current goals can now be found in the care plan section) Acute Rehab PT Goals Patient Stated Goal: to go home PT Goal Formulation: With patient/family Time For Goal Achievement: 09/20/16 Potential to Achieve Goals: Fair Progress towards PT goals: Progressing toward goals    Frequency    Min 3X/week      PT Plan Current plan remains appropriate       End of Session   Activity  Tolerance: Patient limited by fatigue Patient left: in bed;with call bell/phone within reach;with family/visitor present Nurse Communication: Mobility status PT Visit Diagnosis: Other abnormalities of gait and mobility (R26.89);Pain Pain - Right/Left: Right (bilateral) Pain - part of body: Leg     Time: 1440-1520 PT Time Calculation (min) (ACUTE ONLY): 40 min  Charges:  $Therapeutic Exercise: 23-37 mins $Therapeutic Activity: 8-22 mins                    G Codes:       Rachelle Edwards B. Migdalia Dk PT, DPT Acute Rehabilitation  [phone] Pager [phone]     [place] 09/15/2016, 3:36 PM

## 2016-09-15 NOTE — Progress Notes (Signed)
Nutrition Follow-up  DOCUMENTATION CODES:   Not applicable  INTERVENTION:   -D/c Ensure Enlive po BID, each supplement provides 350 kcal and 20 grams of protein -D/c Glucerna Shake po TID, each supplement provides 220 kcal and 10 grams of protein -MVI daily -30 ml Prostat BID, each supplement provides 100 kcals and 15 grams protein  NUTRITION DIAGNOSIS:   Increased nutrient needs related to wound healing as evidenced by estimated needs.  Progressing  GOAL:   Patient will meet greater than or equal to 90% of their needs  Progressing  MONITOR:   PO intake, Supplement acceptance, I & O's, Labs, Weight trends  REASON FOR ASSESSMENT:   Consult Assessment of nutrition requirement/status  ASSESSMENT:   61 y.o. male with known gout, hypertension, hyperlipidemia, DM, and recent hospitalization with right BKA. He presented with bilateral leg pain.  S/p PROCEDURE 09/12/16:  AMPUTATION ABOVE KNEE, APPLICATION OF WOUND VAC ABOVE KNEE  Spoke with pt who reports good appetite currently and PTA. Meal completion 30-100%. Pt reports he ate some eggs, sausage, and fruit this AM. Noted multiple bags of outside food in pt room; he reports family members have also been bringing food in, that he has consumed. Pt does not like the Ensure or Glucerna supplements.   He reports he has lost weight in the past, however, is unable to provide further details regarding wt loss. Suspect weight loss may be related to amputations.   Nutrition-Focused physical exam completed. Findings are no fat depletion, no muscle depletion, and no edema.   Discussed importance of good glycemic control and good PO intake (especially protein) to promote wound healing.   Labs reviewed: Na: 132, CBGS: 103-210.   Diet Order:  Diet Carb Modified Fluid consistency: Thin; Room service appropriate? Yes  Skin:  Wound (see comment) (wound vac rt AKA, st III lt buttock, UN rt buttock)  Last BM:  09/15/16  Height:   Ht  Readings from Last 1 Encounters:  09/03/16 5\' 8"  (1.727 m)    Weight:   Wt Readings from Last 1 Encounters:  09/13/16 124 lb (56.2 kg)    Ideal Body Weight:  59.9 kg  BMI:  Body mass index is 18.85 kg/m.  Estimated Nutritional Needs:   Kcal:  1700-1900  Protein:  85-100 grams  Fluid:  1.7-1.9 L/day  EDUCATION NEEDS:   No education needs identified at this time  Dinh Ayotte A. Jimmye Norman, RD, LDN, CDE Pager: [phone] After hours Pager: [phone]

## 2016-09-15 NOTE — Progress Notes (Signed)
On call provider made aware of 13 beat run vtach; pt asleep, no distress.  Now HR 70s, NSR.  Continue to monitor.

## 2016-09-15 NOTE — Clinical Social Work Note (Signed)
CSW spoke with MD. Plan is to d/c pt tomorrow if WBC is not elevated. Facility notified.   [employer], Todd [name]

## 2016-09-15 NOTE — Discharge Instructions (Signed)
Phantom Limb Pain Phantom limb pain occurs in an arm or leg following an amputation. It is pain in an extremity that no longer exists. This pain varies with different patients. Different activities may cause the pain. Some people with an amputated limb experience the opposite of phantom pain, which is phantom pleasure. The trouble may start in a part of the brain known as the sensory cortex. The sensory cortex is the portion of your brain that processes sensations from the rest of your body. It is hypothesized that when a body part is lost, the corresponding part of the brain is not able to handle the loss and rewires its circuitry to make up for the signals it no longer receives from the missing extremity. The exact mechanism of how phantom limb pain occurs is not known. The severity of pain seems to be correlated with personal problems such as stress and attitude. It also seems to correlate with the amount of pain a person had before the operation. What are the causes?  Damaged nerve endings.  Scar tissue at the amputation site. How is this treated? Phantom limb pain can be severe and debilitating. Most cases of phantom limb pain only last briefly. There are a number of different therapies and medications that may give relief. Keep working with your health care provider until relief is obtained. Some treatments that may be helpful include:  Hypnosis and mental imagery. Their techniques can give patients the needed impetus to recognize their ability to regain control.  Biofeedback.  Relaxation techniques. They are related to hypnosis techniques and use the mind and body to control pain.  Acupuncture.  Massage.  Exercise.  Antidepressant medicine.  Anticonvulsant medicine.  Narcotics or pain medicines. Contact a health care provider if: Pain is not relieved or increases. This information is not intended to replace advice given to you by your health care provider. Make sure you discuss  any questions you have with your health care provider. Document Released: 08/20/2002 Document Revised: 02/09/2016 Document Reviewed: 10/30/2012 Elsevier Interactive Patient Education  2017 [employer].

## 2016-09-15 NOTE — Progress Notes (Signed)
Patient ID: Todd [name], male   DOB: Feb 08, 1956, 61 y.o.   MRN: [id number]   PROGRESS NOTE  Todd [name]  TFT:[id number] DOB: Jun 04, 1956 DOA: 09/08/2016  PCP: [name], MD   Brief Narrative:  61 y.o. male with known gout, hypertension, hyperlipidemia and recent hospitalization with right BKA. He presented with bilateral leg pain. States the pain has been worsening throbbing for last 3 days. Rates pain as 4-10. Has been taking outpatient narcotics with little to no effect.   Assessment & Plan:   Sepsis secondary to Wound infection R BKA - pt met criteria for sepsis on admission, T 101.5, WBC 25, HR 90 - pt reported some purulent drainage from R BKA - pt is currently post op day #29 s/p R BKA  - currently post op day #3, s/p R AKA, by Dr. Sharol Given - pt reports feeling better, wound VAC is functioning well no drainage and the canister - continue vanc and zosyn day #8, d/w ortho if OK to stop ABX at this point - repeat CBC in AM and monitor VS   CKD stage III - about 8 days ago Cr was as high as 2.1 and on admission Cr 2 - Cr trending down and is WNL on 4/4 - BMP in AM  Chronic systolic dysfunction - Echocardiogram 3/23 read as EF 45-50% with basal midinferior myocardial hypokinesis.  - There is no evidence of ACS with unchanged ECG, no angina. - no chest pain this AM  - weight trend since admission, ask RN to record weights  Filed Weights   09/11/16 1300 09/13/16 0500  Weight: 58.5 kg (129 lb) 56.2 kg (124 lb)   Transaminitis - unclear etiology - RUQ with no acute findings  - LFT's trending down overall  - CMET in AM  CAD - Daily aspirin  Malnutrition, moderate  - nutritionist consulted, appreciate assistance   BPH - Cont flomax  Chronic diarrhea - C. Diff negative- stopped stool softeners, pt has been on miralax, colace, bisacodyl  - allow imodium as needed   Gout - Continue: Colcrys bid  Hypothyroidism - continue synthroid  Hyperkalemia -  resolved   Anemia of chronic disease  - drop in Hg noted since admission but no evidence of active bleeding  - Hg is overall stable: 7.2 --> 8.7 --> 8.3 --> pending CBC this AM  Foley cath in place - asked RN to replace or do In/Out cath   Penile infection - pus noted around but overall much better  - current ABX should be adequate   Thrombocytosis - reactive, trending down post op  - CBC in AM   Hypertension, essential  - continue Coreg  - reasonable control for now   DM type II with complications of nephropathy and PVD - continue lantus and SSI   Hyperlipidemia - Continue statin  DVT prophylaxis: Heparin SQ Code Status: Full  Family Communication: Patient at bedside, no family at bedside this am, sister last updated 4/2 Disposition Plan: to be determined, likely SNF in 1-2 days if surgery team clears   Consultants:   Vascular surgery   Dr. Sharol Given  Antimicrobials:   Vancomycin 3/29 -->  Zosyn 3/29 -->   Subjective: No events overnight, pt reports feeling better overall.   Objective: Vitals:   09/14/16 1437 09/14/16 2022 09/15/16 0513 09/15/16 0900  BP: 117/67 120/62 127/66 121/70  Pulse: 71 68 70 75  Resp: 16 16 16 18   Temp: 98.9 F (37.2 C) 98.9 F (37.2 C) 99.2 F (37.3  C) 98.9 F (37.2 C)  TempSrc: Oral Oral Oral Oral  SpO2: 100% 100% 100% 100%  Weight:        Intake/Output Summary (Last 24 hours) at 09/15/16 0930 Last data filed at 09/15/16 0541  Gross per 24 hour  Intake              490 ml  Output              850 ml  Net             -360 ml   Examination:  General exam: Appears calm and comfortable  Respiratory system: Clear to auscultation. Respiratory effort normal. Cardiovascular system: S1 & S2 heard, RRR. No rubs, gallops or clicks.  Gastrointestinal system: Abdomen is nondistended, soft and nontender. No organomegaly or masses felt.. Central nervous system: Alert and oriented.  Extremities: R AKA  Data Reviewed: I have  personally reviewed following labs and imaging studies  CBC:  Recent Labs Lab 09/08/16 1259  09/09/16 0418 09/10/16 0345 09/11/16 0501 09/13/16 0650 09/14/16 0550  WBC 25.1*  < > 22.5* 24.8* 30.6* 17.1* 18.1*  NEUTROABS 20.2*  --   --   --   --   --   --   HGB 8.6*  < > 7.4* 7.7* 7.2* 8.7* 8.3*  HCT 26.1*  < > 22.9* 24.2* 22.8* 26.7* 25.8*  MCV 80.3  < > 80.4 81.2 81.4 81.2 81.1  PLT 759*  < > 775* 847* 890* 835* 808*  < > = values in this interval not displayed. Basic Metabolic Panel:  Recent Labs Lab 09/09/16 0418 09/10/16 0345 09/11/16 0501 09/12/16 0541 09/13/16 0650 09/14/16 0550  NA 135 135 133* 134* 134* 136  K 4.0 3.8 4.1 3.8 3.7 3.5  CL 108 108 104 105 107 106  CO2 15* 17* 19* 21* 20* 22  GLUCOSE 111* 69 108* 82 91 95  BUN 29* 17 15 13 10 9   CREATININE 1.48* 1.33* 1.59* 1.42* 1.19 1.11  CALCIUM 7.5* 7.6* 7.9* 7.9* 7.6* 7.6*  MG 1.8  --   --   --   --   --   PHOS 2.9  --   --   --   --   --    Liver Function Tests:  Recent Labs Lab 09/08/16 1259 09/10/16 0345 09/13/16 0650 09/14/16 0550  AST 163* 58* 37 70*  ALT 103* 59 31 42  ALKPHOS 274* 263* 188* 227*  BILITOT 1.5* 0.6 0.6 0.8  PROT 8.7* 7.2 7.3 7.1  ALBUMIN 2.0* 1.6* 1.6* 1.5*   Coagulation Profile:  Recent Labs Lab 09/09/16 0418  INR 1.24   CBG:  Recent Labs Lab 09/14/16 0648 09/14/16 1138 09/14/16 1650 09/14/16 2114 09/15/16 0650  GLUCAP 101* 107* 148* 76 84   Urine analysis:    Component Value Date/Time   COLORURINE YELLOW 09/08/2016 1306   APPEARANCEUR HAZY (A) 09/08/2016 1306   APPEARANCEUR Clear 08/05/2016 1630   LABSPEC 1.019 09/08/2016 1306   PHURINE 5.0 09/08/2016 1306   GLUCOSEU NEGATIVE 09/08/2016 1306   HGBUR NEGATIVE 09/08/2016 1306   BILIRUBINUR NEGATIVE 09/08/2016 1306   BILIRUBINUR Negative 08/05/2016 1630   KETONESUR NEGATIVE 09/08/2016 1306   PROTEINUR 30 (A) 09/08/2016 1306   NITRITE NEGATIVE 09/08/2016 1306   LEUKOCYTESUR NEGATIVE 09/08/2016 1306     LEUKOCYTESUR Negative 08/05/2016 1630   Recent Results (from the past 240 hour(s))  Urine culture     Status: None   Collection Time: 09/08/16 12:59 PM  Result Value Ref Range Status   Specimen Description URINE, CATHETERIZED  Final   Special Requests NONE  Final   Culture NO GROWTH  Final   Report Status 09/09/2016 FINAL  Final  Blood Culture (routine x 2)     Status: None   Collection Time: 09/08/16  1:15 PM  Result Value Ref Range Status   Specimen Description BLOOD RIGHT HAND  Final   Special Requests BOTTLES DRAWN AEROBIC AND ANAEROBIC 5CC  Final   Culture NO GROWTH 5 DAYS  Final   Report Status 09/13/2016 FINAL  Final  Blood Culture (routine x 2)     Status: None   Collection Time: 09/08/16  1:15 PM  Result Value Ref Range Status   Specimen Description BLOOD RIGHT FOREARM  Final   Special Requests BOTTLES DRAWN AEROBIC AND ANAEROBIC 5CC  Final   Culture NO GROWTH 5 DAYS  Final   Report Status 09/13/2016 FINAL  Final  Gastrointestinal Panel by PCR , Stool     Status: None   Collection Time: 09/09/16  2:01 PM  Result Value Ref Range Status   Campylobacter species NOT DETECTED NOT DETECTED Final   Plesimonas shigelloides NOT DETECTED NOT DETECTED Final   Salmonella species NOT DETECTED NOT DETECTED Final   Yersinia enterocolitica NOT DETECTED NOT DETECTED Final   Vibrio species NOT DETECTED NOT DETECTED Final   Vibrio cholerae NOT DETECTED NOT DETECTED Final   Enteroaggregative E coli (EAEC) NOT DETECTED NOT DETECTED Final   Enteropathogenic E coli (EPEC) NOT DETECTED NOT DETECTED Final   Enterotoxigenic E coli (ETEC) NOT DETECTED NOT DETECTED Final   Shiga like toxin producing E coli (STEC) NOT DETECTED NOT DETECTED Final   Shigella/Enteroinvasive E coli (EIEC) NOT DETECTED NOT DETECTED Final   Cryptosporidium NOT DETECTED NOT DETECTED Final   Cyclospora cayetanensis NOT DETECTED NOT DETECTED Final   Entamoeba histolytica NOT DETECTED NOT DETECTED Final   Giardia  lamblia NOT DETECTED NOT DETECTED Final   Adenovirus F40/41 NOT DETECTED NOT DETECTED Final   Astrovirus NOT DETECTED NOT DETECTED Final   Norovirus GI/GII NOT DETECTED NOT DETECTED Final   Rotavirus A NOT DETECTED NOT DETECTED Final   Sapovirus (I, II, IV, and V) NOT DETECTED NOT DETECTED Final  C difficile quick scan w PCR reflex     Status: None   Collection Time: 09/09/16  2:01 PM  Result Value Ref Range Status   C Diff antigen NEGATIVE NEGATIVE Final   C Diff toxin NEGATIVE NEGATIVE Final   C Diff interpretation No C. difficile detected.  Final  MRSA PCR Screening     Status: Abnormal   Collection Time: 09/10/16  4:45 PM  Result Value Ref Range Status   MRSA by PCR POSITIVE (A) NEGATIVE Final    Radiology Studies: No results found. Scheduled Meds: . aspirin  325 mg Oral Daily  . atorvastatin  40 mg Oral q1800  . carvedilol  3.125 mg Oral BID WC  . colchicine  0.6 mg Oral BID  . collagenase   Topical Daily  . feeding supplement (ENSURE ENLIVE)  237 mL Oral BID BM  . feeding supplement (GLUCERNA SHAKE)  237 mL Oral BID BM  . heparin  5,000 Units Subcutaneous Q8H  . insulin aspart  0-15 Units Subcutaneous TID WC  . insulin aspart  4 Units Subcutaneous TID WC  . insulin glargine  10 Units Subcutaneous Daily  . levothyroxine  100 mcg Oral QAC breakfast  . mupirocin cream   Topical  BID  . piperacillin-tazobactam (ZOSYN)  IV  3.375 g Intravenous Q8H  . pregabalin  200 mg Oral Daily  . tamsulosin  0.4 mg Oral Daily  . vancomycin  1,000 mg Intravenous Q24H   Continuous Infusions: . sodium chloride Stopped (09/14/16 0800)  . lactated ringers Stopped (09/14/16 0900)     LOS: 7 days   Time spent: 20 minutes   Faye Ramsay, MD [facility] Pager [phone]  If 7PM-7AM, please contact night-coverage [email or web address] Password [email or web address] 09/15/2016, 9:30 AM

## 2016-09-15 NOTE — Discharge Summary (Signed)
Physician Discharge Summary  Todd [name] WCH:[id number] DOB: 1955/09/05 DOA: 09/08/2016  PCP: [name], MD  Admit date: 09/08/2016 Discharge date: 09/15/2016  Recommendations for Outpatient Follow-up:  1. Pt will need to follow up with PCP in 1-2 weeks post discharge 2. Please obtain BMP to evaluate electrolytes and kidney function 3. Please also check CBC to evaluate Hg and Hct levels 4. Pt to follow up with Dr. Sharol Given, please see follow up information below   Discharge Diagnoses:  Active Problems:   Wound infection after surgery   Right BKA infection ([place])  Discharge Condition: Stable  Diet recommendation: Heart healthy diet discussed in details   Brief Narrative:  61 y.o.malewith known gout, hypertension, hyperlipidemia and recent hospitalization with right BKA. He presented with bilateral leg pain. States the pain has been worsening throbbing for last 3 days. Rates pain as 4-10. Has been taking outpatient narcotics with little to no effect.   Assessment & Plan:   Sepsis secondary to Wound infection R BKA - pt met criteria for sepsis on admission, T 101.5, WBC 25, HR 90 - pt reported some purulent drainage from R BKA - pt is currently post op day #29 s/p R BKA  - currently post op day #3, s/p R AKA, by Dr. Sharol Given - pt reports feeling better, wound VAC is functioning well no drainage and the canister - continue vanc and zosyn day #8, stop ABX per ortho  - WBC overall trending down  - repeat CBC in AM and monitor VS   CKD stage III - about 8 days ago Cr was as high as 2.1 and on admission Cr 2 - Cr trending down and is WNL this AM - BMP in AM  Chronic systolic dysfunction - Echocardiogram 3/23 read as EF 45-50% with basal midinferior myocardial hypokinesis.  - There is noevidence of ACS with unchanged ECG, no angina. - no chest pain this AM  - weight trend since admission, ask RN to record weights      Filed Weights   09/11/16 1300 09/13/16 0500  Weight:  58.5 kg (129 lb) 56.2 kg (124 lb)   Transaminitis - unclear etiology - RUQ with no acute findings  - LFT's trending down overall   CAD - Daily aspirin  Malnutrition, moderate  - nutritionist consulted, appreciate assistance   BPH - Cont flomax  Chronic diarrhea - C. Diff negative- stopped stool softeners, pt has been on miralax, colace, bisacodyl  - allow imodium as needed   Gout - Continue: Colcrys bid  Hypothyroidism - continue synthroid  Hyperkalemia - resolved   Anemia of chronic disease  - drop in Hg noted since admission but no evidence of active bleeding  - Hg is overall stable: 7.2 --> 8.7 --> 8.3 --> 8.4 - CBC in AM  Foley cath in place - asked RN to replace or do In/Out cath   Penile infection - pus noted around but overall much better  - current ABX should be adequate   Thrombocytosis - reactive, trending down post op  - CBC in AM  Hypertension, essential  - continue Coreg  - reasonable control for now   DM type II with complications of nephropathy and PVD - continue lantus per previous home regimen   Hyperlipidemia - Continue statin  DVT prophylaxis: Heparin SQ Code Status: Full  Family Communication: Patient at bedside, no family at bedside this am Disposition Plan: SNF in AM if pt better and WBC improving, no fevers   Consultants:  Vascular surgery   Dr. Sharol Given  Antimicrobials:   Vancomycin 3/29 --> 4/5  Zosyn 3/29 --> 4/5  Procedures/Studies: Dg Knee 1-2 Views Right  Result Date: 09/08/2016 CLINICAL DATA:  Recent BKA.  Signs of infection now. EXAM: RIGHT KNEE - 1-2 VIEW COMPARISON:  Right knee images of March 04, 2014. FINDINGS: The patient has undergone below-the-knee amputation. The surgical bony margins appear sharp. There is soft tissue swelling over the stump. Surgical skin staples are present. No definite gas collections are observed. IMPRESSION: No objective evidence of osteomyelitis is observed.  Electronically Signed   By: David  Martinique M.D.   On: 09/08/2016 13:55   Dg Abd 1 View  Result Date: 09/08/2016 CLINICAL DATA:  Constipation and diarrhea for several days EXAM: ABDOMEN - 1 VIEW COMPARISON:  None. FINDINGS: No abnormal stool retention. There is a moderate volume of colonic gas mainly seen from the ascending segment and distal transverse segment. No evidence of small bowel obstruction. Atherosclerotic calcification. No concerning mass effect. Density over the right flank at the level of L3-4 is indeterminate on this single film. Lower lumbar facet arthropathy. Prominent atherosclerosis. IMPRESSION: 1. Nonobstructive bowel gas pattern. Moderate colonic gas. No abnormal stool retention. 2. Prominent atherosclerotic calcification. Electronically Signed   By: Monte Fantasia M.D.   On: 09/08/2016 15:36   US Renal Port  Result Date: 08/31/2016 CLINICAL DATA:  Acute onset of renal insufficiency. Initial encounter. EXAM: RENAL / URINARY TRACT ULTRASOUND COMPLETE COMPARISON:  Renal ultrasound performed 07/04/2016 FINDINGS: Right Kidney: Length: 11.6 cm. Echogenicity within normal limits. No mass or hydronephrosis visualized. Left Kidney: Length: 11.8 cm. Echogenicity within normal limits. No mass or hydronephrosis visualized. Bladder: Decompressed, with a Foley catheter in place. IMPRESSION: Unremarkable renal ultrasound.  No evidence of hydronephrosis. Electronically Signed   By: Garald Balding M.D.   On: 08/31/2016 03:13   Dg Chest Port 1 View  Result Date: 08/31/2016 CLINICAL DATA:  61 year old male with central line placement. EXAM: PORTABLE CHEST 1 VIEW COMPARISON:  Chest radiograph dated 08/30/2016 FINDINGS: There has been interval placement of a right IJ central line with tip over central SVC. There is no pneumothorax. Minimal bibasilar linear and platelike atelectatic changes noted. No focal consolidation, pleural effusion. The cardiac silhouette is within normal limits. No acute osseous  pathology. Multiple surgical clips at the base of the neck likely from prior thyroid surgery. IMPRESSION: Interval placement of a right IJ central line with tip over central SVC. No pneumothorax. Electronically Signed   By: Anner Crete M.D.   On: 08/31/2016 00:29   US Abdomen Limited Ruq  Result Date: 09/09/2016 CLINICAL DATA:  Transaminitis EXAM: US ABDOMEN LIMITED - RIGHT UPPER QUADRANT COMPARISON:  None. FINDINGS: Gallbladder: No gallstones or wall thickening visualized. No sonographic Murphy sign noted by sonographer. Common bile duct: Diameter: 4.2 mm Liver: Increased parenchymal echogenicity. No mass or focal lesion. Liver normal in overall size. IMPRESSION: 1. No acute findings.  Normal gallbladder.  No bile duct dilation. 2. Increased liver parenchymal echogenicity consistent with hepatic steatosis. Electronically Signed   By: Lajean Manes M.D.   On: 09/09/2016 16:31     Discharge Exam: Vitals:   09/15/16 0513 09/15/16 0900  BP: 127/66 121/70  Pulse: 70 75  Resp: 16 18  Temp: 99.2 F (37.3 C) 98.9 F (37.2 C)   Vitals:   09/14/16 1437 09/14/16 2022 09/15/16 0513 09/15/16 0900  BP: 117/67 120/62 127/66 121/70  Pulse: 71 68 70 75  Resp: 16 16 16  18  Temp: 98.9 F (37.2 C) 98.9 F (37.2 C) 99.2 F (37.3 C) 98.9 F (37.2 C)  TempSrc: Oral Oral Oral Oral  SpO2: 100% 100% 100% 100%  Weight:        General: Pt is alert, follows commands appropriately, not in acute distress Cardiovascular: Regular rate and rhythm, S1/S2 +, no murmurs, no rubs, no gallops Respiratory: Clear to auscultation bilaterally, no wheezing, no crackles, no rhonchi Abdominal: Soft, non tender, non distended, bowel sounds +, no guarding Extremities: R AKA  Neuro: Grossly nonfocal  Discharge Instructions    Contact information for follow-up providers    Newt Minion, MD Follow up in 1 week(s).   Specialty:  Orthopedic Surgery Contact information: [place]  [phone]            Contact information for after-discharge care    Destination    [facility] Follow up.   Specialty:  [place] information: [address] [phone]                 Allergies as of 09/15/2016      Reactions   Lisinopril Other (See Comments)   Hyperkalemia / Renal failure      Medication List    STOP taking these medications   polyethylene glycol packet Commonly known as:  MIRALAX / GLYCOLAX   traMADol-acetaminophen 37.5-325 MG tablet Commonly known as:  ULTRACET     TAKE these medications   acetaminophen 325 MG tablet Commonly known as:  TYLENOL Take 2 tablets (650 mg total) by mouth every 6 (six) hours as needed for mild pain (or Fever >/= 101).   aspirin 325 MG EC tablet Take 1 tablet (325 mg total) by mouth daily.   atorvastatin 40 MG tablet Commonly known as:  LIPITOR Take 1 tablet (40 mg total) by mouth daily at 6 PM.   carvedilol 3.125 MG tablet Commonly known as:  COREG Take 1 tablet (3.125 mg total) by mouth 2 (two) times daily with a meal.   COLCRYS 0.6 MG tablet Generic drug:  colchicine Take 0.6 mg by mouth 2 (two) times daily.   collagenase ointment Commonly known as:  SANTYL Apply topically daily. Start taking on:  09/16/2016   feeding supplement (GLUCERNA SHAKE) Liqd Take 237 mLs by mouth 2 (two) times daily between meals.   Insulin Glargine 100 UNIT/ML Solostar Pen Commonly known as:  LANTUS SOLOSTAR Inject 10 Units into the skin daily.   levothyroxine 100 MCG tablet Commonly known as:  SYNTHROID, LEVOTHROID Take 1 tablet (100 mcg total) by mouth daily before breakfast.   loperamide 2 MG capsule Commonly known as:  IMODIUM Take 4 mg by mouth as needed for diarrhea or loose stools.   oxyCODONE-acetaminophen 5-325 MG tablet Commonly known as:  PERCOCET/ROXICET Take 1 tablet by mouth every 4 (four) hours as needed for severe  pain. What changed:  how much to take   pregabalin 200 MG capsule Commonly known as:  LYRICA Take 1 capsule (200 mg total) by mouth daily.   tamsulosin 0.4 MG Caps capsule Commonly known as:  FLOMAX Take 0.4 mg by mouth daily.            Durable Medical Equipment        Start     Ordered   09/10/16 [phone]  For home use only DME Specialty mattress  Once    Comments:  AIR MATTRESS   09/10/16 0910  The results of significant diagnostics from this hospitalization (including imaging, microbiology, ancillary and laboratory) are listed below for reference.     Microbiology: Recent Results (from the past 240 hour(s))  Urine culture     Status: None   Collection Time: 09/08/16 12:59 PM  Result Value Ref Range Status   Specimen Description URINE, CATHETERIZED  Final   Special Requests NONE  Final   Culture NO GROWTH  Final   Report Status 09/09/2016 FINAL  Final  Blood Culture (routine x 2)     Status: None   Collection Time: 09/08/16  1:15 PM  Result Value Ref Range Status   Specimen Description BLOOD RIGHT HAND  Final   Special Requests BOTTLES DRAWN AEROBIC AND ANAEROBIC 5CC  Final   Culture NO GROWTH 5 DAYS  Final   Report Status 09/13/2016 FINAL  Final  Blood Culture (routine x 2)     Status: None   Collection Time: 09/08/16  1:15 PM  Result Value Ref Range Status   Specimen Description BLOOD RIGHT FOREARM  Final   Special Requests BOTTLES DRAWN AEROBIC AND ANAEROBIC 5CC  Final   Culture NO GROWTH 5 DAYS  Final   Report Status 09/13/2016 FINAL  Final  Gastrointestinal Panel by PCR , Stool     Status: None   Collection Time: 09/09/16  2:01 PM  Result Value Ref Range Status   Campylobacter species NOT DETECTED NOT DETECTED Final   Plesimonas shigelloides NOT DETECTED NOT DETECTED Final   Salmonella species NOT DETECTED NOT DETECTED Final   Yersinia enterocolitica NOT DETECTED NOT DETECTED Final   Vibrio species NOT DETECTED NOT DETECTED Final   Vibrio  cholerae NOT DETECTED NOT DETECTED Final   Enteroaggregative E coli (EAEC) NOT DETECTED NOT DETECTED Final   Enteropathogenic E coli (EPEC) NOT DETECTED NOT DETECTED Final   Enterotoxigenic E coli (ETEC) NOT DETECTED NOT DETECTED Final   Shiga like toxin producing E coli (STEC) NOT DETECTED NOT DETECTED Final   Shigella/Enteroinvasive E coli (EIEC) NOT DETECTED NOT DETECTED Final   Cryptosporidium NOT DETECTED NOT DETECTED Final   Cyclospora cayetanensis NOT DETECTED NOT DETECTED Final   Entamoeba histolytica NOT DETECTED NOT DETECTED Final   Giardia lamblia NOT DETECTED NOT DETECTED Final   Adenovirus F40/41 NOT DETECTED NOT DETECTED Final   Astrovirus NOT DETECTED NOT DETECTED Final   Norovirus GI/GII NOT DETECTED NOT DETECTED Final   Rotavirus A NOT DETECTED NOT DETECTED Final   Sapovirus (I, II, IV, and V) NOT DETECTED NOT DETECTED Final  C difficile quick scan w PCR reflex     Status: None   Collection Time: 09/09/16  2:01 PM  Result Value Ref Range Status   C Diff antigen NEGATIVE NEGATIVE Final   C Diff toxin NEGATIVE NEGATIVE Final   C Diff interpretation No C. difficile detected.  Final  MRSA PCR Screening     Status: Abnormal   Collection Time: 09/10/16  4:45 PM  Result Value Ref Range Status   MRSA by PCR POSITIVE (A) NEGATIVE Final    Comment:        The GeneXpert MRSA Assay (FDA approved for NASAL specimens only), is one component of a comprehensive MRSA colonization surveillance program. It is not intended to diagnose MRSA infection nor to guide or monitor treatment for MRSA infections. RESULT CALLED TO, READ BACK BY AND VERIFIED WITH: C WHITESIDE,RN AT 1923 09/10/16 BY L BENFIELD   C difficile quick scan w PCR reflex  Status: None   Collection Time: 09/14/16  9:49 AM  Result Value Ref Range Status   C Diff antigen NEGATIVE NEGATIVE Final   C Diff toxin NEGATIVE NEGATIVE Final   C Diff interpretation No C. difficile detected.  Final    Labs: Basic  Metabolic Panel:  Recent Labs Lab 09/09/16 0418  09/11/16 0501 09/12/16 0541 09/13/16 0650 09/14/16 0550 09/15/16 1055  NA 135  < > 133* 134* 134* 136 136  K 4.0  < > 4.1 3.8 3.7 3.5 3.5  CL 108  < > 104 105 107 106 109  CO2 15*  < > 19* 21* 20* 22 19*  GLUCOSE 111*  < > 108* 82 91 95 77  BUN 29*  < > 15 13 10 9 6   CREATININE 1.48*  < > 1.59* 1.42* 1.19 1.11 0.94  CALCIUM 7.5*  < > 7.9* 7.9* 7.6* 7.6* 7.4*  MG 1.8  --   --   --   --   --   --   PHOS 2.9  --   --   --   --   --   --   < > = values in this interval not displayed. Liver Function Tests:  Recent Labs Lab 09/10/16 0345 09/13/16 0650 09/14/16 0550  AST 58* 37 70*  ALT 59 31 42  ALKPHOS 263* 188* 227*  BILITOT 0.6 0.6 0.8  PROT 7.2 7.3 7.1  ALBUMIN 1.6* 1.6* 1.5*   CBC:  Recent Labs Lab 09/10/16 0345 09/11/16 0501 09/13/16 0650 09/14/16 0550 09/15/16 1055  WBC 24.8* 30.6* 17.1* 18.1* 15.6*  HGB 7.7* 7.2* 8.7* 8.3* 8.4*  HCT 24.2* 22.8* 26.7* 25.8* 26.6*  MCV 81.2 81.4 81.2 81.1 81.6  PLT 847* 890* 835* 808* 813*   BNP (last 3 results)  Recent Labs  07/03/16 2143 08/03/16 1119  BNP 37.0 118.6*   CBG:  Recent Labs Lab 09/14/16 2114 09/15/16 0650 09/15/16 1143 09/15/16 1204 09/15/16 1813  GLUCAP 76 84 61* 109* 114*   SIGNED: Time coordinating discharge: 30 minutes  Faye Ramsay, MD  [facility] 09/15/2016, 7:23 PM Pager [phone]  If 7PM-7AM, please contact night-coverage [email or web address] Password [email or web address]

## 2016-09-16 DIAGNOSIS — R740 Nonspecific elevation of levels of transaminase and lactic acid dehydrogenase [LDH]: Secondary | ICD-10-CM

## 2016-09-16 DIAGNOSIS — R197 Diarrhea, unspecified: Secondary | ICD-10-CM

## 2016-09-16 DIAGNOSIS — R7401 Elevation of levels of liver transaminase levels: Secondary | ICD-10-CM

## 2016-09-16 LAB — GLUCOSE, CAPILLARY
GLUCOSE-CAPILLARY: 106 mg/dL — AB (ref 65–99)
Glucose-Capillary: 91 mg/dL (ref 65–99)

## 2016-09-16 LAB — CBC
HEMATOCRIT: 25 % — AB (ref 39.0–52.0)
Hemoglobin: 7.9 g/dL — ABNORMAL LOW (ref 13.0–17.0)
MCH: 25.8 pg — ABNORMAL LOW (ref 26.0–34.0)
MCHC: 31.6 g/dL (ref 30.0–36.0)
MCV: 81.7 fL (ref 78.0–100.0)
PLATELETS: 813 10*3/uL — AB (ref 150–400)
RBC: 3.06 MIL/uL — ABNORMAL LOW (ref 4.22–5.81)
RDW: 15.5 % (ref 11.5–15.5)
WBC: 14.2 10*3/uL — AB (ref 4.0–10.5)

## 2016-09-16 LAB — BASIC METABOLIC PANEL
ANION GAP: 11 (ref 5–15)
BUN: 8 mg/dL (ref 6–20)
CALCIUM: 7.3 mg/dL — AB (ref 8.9–10.3)
CO2: 20 mmol/L — AB (ref 22–32)
CREATININE: 0.95 mg/dL (ref 0.61–1.24)
Chloride: 107 mmol/L (ref 101–111)
GLUCOSE: 96 mg/dL (ref 65–99)
Potassium: 3.4 mmol/L — ABNORMAL LOW (ref 3.5–5.1)
Sodium: 138 mmol/L (ref 135–145)

## 2016-09-16 MED ORDER — POTASSIUM CHLORIDE CRYS ER 20 MEQ PO TBCR
20.0000 meq | EXTENDED_RELEASE_TABLET | Freq: Once | ORAL | Status: AC
  Administered 2016-09-16: 20 meq via ORAL
  Filled 2016-09-16: qty 1

## 2016-09-16 NOTE — Progress Notes (Signed)
Report called to [facility] for tranfer there today. Report given to Quillian Quince. All personal belongings with pt. Pt demonstrates no s/sx of distress. PTAR to transport to facility. Prevena Plus wound vac in use presently.

## 2016-09-16 NOTE — Clinical Social Work Placement (Addendum)
  CLINICAL SOCIAL WORK PLACEMENT  NOTE  Date:  09/16/2016  Patient Details  Name: Todd [name] MRN: [id number] Date of Birth: 24-Feb-1956  Clinical Social Work is seeking post-discharge placement for this patient at the [place] level of care (*CSW will initial, date and re-position this form in  chart as items are completed):      Patient/family provided with [facility] Work Department's list of facilities offering this level of care within the geographic area requested by the patient (or if unable, by the patient's family).  Yes   Patient/family informed of their freedom to choose among providers that offer the needed level of care, that participate in Medicare, Medicaid or managed care program needed by the patient, have an available bed and are willing to accept the patient.      Patient/family informed of [facility]'s ownership interest in [facility] and [facility], as well as of the fact that they are under no obligation to receive care at these facilities.  PASRR submitted to EDS on       PASRR number received on 09/13/16     Existing PASRR number confirmed on       FL2 transmitted to all facilities in geographic area requested by pt/family on 09/13/16     FL2 transmitted to all facilities within larger geographic area on       Patient informed that his/her managed care company has contracts with or will negotiate with certain facilities, including the following:        Yes   Patient/family informed of bed offers received.  Patient chooses bed at [facility]     Physician recommends and patient chooses bed at      Patient to be transferred to [facility] on 09/16/16.  Patient to be transferred to facility by PTAR     Patient family notified on 09/16/16 of transfer.  Name of family member notified:  [name]     PHYSICIAN       Additional Comment:  Pt is ready for d/c today and will be going to [facility]. Pt and  sister aware and in agreement with d/c plan. CSW informed pt and sister, [name] [phone]) that pt BCBS coverage to end on 09/20/16, per [place] (admission) at [facility]. Per sister, family will be paying for [employer] coverage for pt. CSW informed Kirstin. Room and report in treatment team sticky note and RN aware. Pt wound vac has been changed to [place]. CSW arranged transportation with PTAR and made aware of wound vac. CSW signing off as no further SW needs identified.   _______________________________________________ [name], LCSW 09/16/2016, 2:13 PM

## 2016-09-16 NOTE — Discharge Summary (Addendum)
Todd [name], is a 61 y.o. male  DOB [birth year]  MRN [id number].  Admission date:  09/08/2016  Admitting Physician  Elwin Mocha, MD  Discharge Date:  09/16/2016   Primary MD  [name], MD  Recommendations for primary care physician for things to follow:   PCP: [name], MD  Admit date: 09/08/2016 Discharge date: 09/15/2016  Recommendations for Outpatient Follow-up:  1. Pt will need to follow up with PCP in 1-2 weeks post discharge 2. Please obtain BMP to evaluate electrolytes and kidney function in 5 days 3. Please also check CBC to evaluate Hg and Hct levels  In 5 days 4. Pt to follow up with Dr. Sharol Given, please see follow up information below 5. Continue wound vac on right bka ([employer]) continuous 72mHg  til sees Dr. DSharol Givenin 1 week  Discharge Diagnoses:  Active Problems:   Wound infection after surgery   Right BKA infection ([place]  Discharge Condition: Stable  Diet recommendation: Heart healthy diet discussed in details    Assessment & Plan:  Sepsis secondary to Wound infection R BKA - pt met criteria for sepsis on admission, T 101.5, WBC 25, HR 90 - pt reported some purulent drainage from R BKA - pt is currently post op day #29s/p R BKA  - currently post op day #4, s/p R AKA, by Dr. DSharol Given- pt reports feeling better, wound VAC is functioning well no drainage and the canister - continue vanc and zosyn day #8, stop ABX per ortho  - WBC overall trending down  - repeat CBC in AM and monitor VS   CKD stage III - about 8 days ago Cr was as high as 2.1 and on admission Cr 2 - Cr trending down and is WNL this AM   Chronic systolic dysfunction - Echocardiogram 3/23 read as EF 45-50% with basal midinferior myocardial hypokinesis.  - There is noevidence of ACS with unchanged ECG, no angina. - no chest pain this AM  - weight trend since admission, ask RN to record  weights      Filed Weights   09/11/16 1300 09/13/16 0500  Weight: 58.5 kg (129 lb) 56.2 kg (124 lb)   Transaminitis - unclear etiology - RUQ with no acute findings  - LFT's trending down overall   CAD - Daily aspirin  Malnutrition, moderate  - nutritionist consulted, appreciate assistance   BPH - Cont flomax  Chronic diarrhea - C. 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 Sharol Given  Antimicrobials:   Vancomycin 3/29 --> 4/5  Zosyn 3/29 -->4/5    Admission Diagnosis  Diarrhea [R19.7] Constipated [K59.00] Right leg pain [M79.604] Elevated LFTs [R79.89] AKI (acute kidney injury) ([place]) [N17.9] Right BKA infection ([place]) [T87.43] Sepsis, due to unspecified organism [facility]) [A41.9] Deep incisional surgical site infection, initial encounter [T81.4XXA] Reactive thrombocytosis [R79.89] Leukocytosis, unspecified type [D72.829]   Discharge Diagnosis  Diarrhea [R19.7] Constipated [K59.00] Right leg pain [M79.604] Elevated LFTs [R79.89] AKI (acute kidney injury) ([place])  [N17.9] Right BKA infection ([place]) [T87.43] Sepsis, due to unspecified organism [facility]) [A41.9] Deep incisional surgical site infection, initial encounter [T81.4XXA] Reactive thrombocytosis [R79.89] Leukocytosis, unspecified type [D72.829]    Principal Problem:   Right BKA infection ([place]) Active Problems:   Wound infection after surgery      Past Medical History:  Diagnosis Date  . Diabetes mellitus without complication ([place])   . Goiter   . Gout   . Hyperlipidemia   . Hypertension   . Thyroid disease   . Wound infection after surgery 08/2016    Past Surgical History:  Procedure Laterality Date  . ABDOMINAL AORTOGRAM N/A 08/11/2016   Procedure: Abdominal Aortogram;  Surgeon: Waynetta Sandy, MD;  Location: [place] [facility];  Service: Cardiovascular;  Laterality: N/A;  . ABDOMINAL AORTOGRAM W/LOWER EXTREMITY N/A 08/15/2016   Procedure: Abdominal Aortogram w/Lower Extremity;  Surgeon: Elam Dutch, MD;  Location: [place] [facility];  Service: Cardiovascular;  Laterality: N/A;  . AMPUTATION Left 08/12/2016   Procedure: LEFT BELOW KNEE AMPUTATION;  Surgeon: Newt Minion, MD;  Location: [place];  Service: Orthopedics;  Laterality: Left;  . AMPUTATION Right 08/17/2016   Procedure: RIGHT BELOW KNEE AMPUTATION;  Surgeon: Elam Dutch, MD;  Location: [place];  Service: Vascular;  Laterality: Right;  . AMPUTATION Right 09/12/2016   Procedure: AMPUTATION ABOVE KNEE;  Surgeon: Newt Minion, MD;  Location: [place];  Service: Orthopedics;  Laterality: Right;  . APPLICATION OF WOUND VAC Right 09/12/2016   Procedure: APPLICATION OF WOUND VAC ABOVE KNEE;  Surgeon: Newt Minion, MD;  Location: [place];  Service: Orthopedics;  Laterality: Right;  . LOWER EXTREMITY ANGIOGRAPHY Bilateral 08/11/2016   Procedure: Lower Extremity Angiography;  Surgeon: Waynetta Sandy, MD;  Location: [place] [facility];  Service: Cardiovascular;  Laterality: Bilateral;  . PERIPHERAL VASCULAR BALLOON ANGIOPLASTY  Left 08/11/2016   Procedure: Peripheral Vascular Balloon Angioplasty;  Surgeon: Waynetta Sandy, MD;  Location: [place] [facility];  Service: Cardiovascular;  Laterality: Left;  SFA  . THYROID SURGERY         HPI  from the history and physical done on the day of admission:     61 y.o. male  passable is a 25, gout, hypertension, hyperlipidemia and recent hospitalization with left to right BKA. He presents with bilateral leg pain. States the pain has been worsening throbbing for last 3 days. Rates pain as 4-10. Has been taking outpatient narcotics with little to no effect. Patient states her some purulent asthma discharged with from his right stump.  ED Course: Dr. Sharol Given consulted. Abx started.  Hospital Course:      Pt had R AKA 09/12/2016,  Pt was continued on vanco and zosyn for 8 days course. Recommendation to d/c per orthopedics. Xray negative for osteomyelitis.   Pt has wound vac on right stump. Pt's renal function has remained stable.  hgb stable. Pt c/o loose stool but GI pathogen panel was negative.  Blood culture negative.   Pt has been eating ok, and his bm have been normal.  Wbc 14 today, which is trending down.  Thought to be reactive.  Potassium 3.4 today,  Pt tx with Kdur 20 meq po x1.    Pt will be discharge to SNF today      Follow UP   Contact information for follow-up providers    Newt Minion, MD Follow up in 1 week(s).   Specialty:  Orthopedic Surgery Contact information: [place] [address] [phone]            Contact information for after-discharge care    Destination    [facility] Follow up.   Specialty:  [place] information: [place] [phone]                  Discharge condition,  Stable  Diet and Activity recommendation: See Discharge Instructions below  Discharge Instructions    Please have Dr. Sharol Given remove  staples from L BKA at his office.      Discharge Medications     Allergies as of 09/16/2016      Reactions   Lisinopril Other (See Comments)   Hyperkalemia / Renal failure      Medication List    STOP taking these medications   polyethylene glycol packet Commonly known as:  MIRALAX / GLYCOLAX   traMADol-acetaminophen 37.5-325 MG tablet Commonly known as:  ULTRACET     TAKE these medications   acetaminophen 325 MG tablet Commonly known as:  TYLENOL Take 2 tablets (650 mg total) by mouth every 6 (six) hours as needed for mild pain (or Fever >/= 101).   aspirin 325 MG EC tablet Take 1 tablet (325 mg total) by mouth daily.   atorvastatin 40 MG tablet Commonly known as:  LIPITOR Take 1 tablet (40 mg total) by mouth daily at 6 PM.   carvedilol 3.125 MG tablet Commonly known as:  COREG Take 1 tablet (3.125 mg total) by mouth 2 (two) times daily with a meal.   COLCRYS 0.6 MG tablet Generic drug:  colchicine Take 0.6 mg by mouth 2 (two) times daily.   collagenase ointment Commonly known as:  SANTYL Apply topically daily.   feeding supplement (GLUCERNA SHAKE) Liqd Take 237 mLs by mouth 2 (two) times daily between meals.   Insulin Glargine 100 UNIT/ML Solostar Pen Commonly known as:  LANTUS SOLOSTAR Inject 10 Units into the skin daily.   levothyroxine 100 MCG tablet Commonly known as:  SYNTHROID, LEVOTHROID Take 1 tablet (100 mcg total) by mouth daily before breakfast.   loperamide 2 MG capsule Commonly known as:  IMODIUM Take 4 mg by mouth as needed for diarrhea or loose stools.   oxyCODONE-acetaminophen 5-325 MG tablet Commonly known as:  PERCOCET/ROXICET Take 1 tablet by mouth every 4 (four) hours as needed for severe pain. What changed:  how much to take   pregabalin 200 MG capsule Commonly known as:  LYRICA Take 1 capsule (200 mg total) by mouth daily.   tamsulosin 0.4 MG Caps capsule Commonly  known as:  FLOMAX Take 0.4 mg by mouth daily.             Durable Medical Equipment        Start     Ordered   09/10/16 [phone]  For home use only DME Specialty mattress  Once    Comments:  AIR MATTRESS   09/10/16 9233      Major procedures and Radiology Reports - PLEASE review detailed and final reports for all details, in brief -      Dg Knee 1-2 Views Right  Result Date: 09/08/2016 CLINICAL DATA:  Recent BKA.  Signs of infection now. EXAM: RIGHT KNEE - 1-2 VIEW COMPARISON:  Right knee images of March 04, 2014. FINDINGS: The patient has undergone below-the-knee amputation. The surgical bony margins appear sharp. There is soft tissue swelling over the stump. Surgical skin staples are present. No definite gas collections are observed. IMPRESSION: No objective evidence of osteomyelitis is observed. Electronically Signed   By: David  Martinique M.D.   On: 09/08/2016 13:55   Dg Abd 1 View  Result Date: 09/08/2016 CLINICAL DATA:  Constipation and diarrhea for several days EXAM: ABDOMEN - 1 VIEW COMPARISON:  None. FINDINGS: No abnormal stool retention. There is a moderate volume of colonic gas mainly seen from the ascending segment and distal transverse segment. No evidence of small bowel obstruction. Atherosclerotic calcification. No concerning mass effect. Density over the right flank at the level of L3-4 is indeterminate on this single film. Lower lumbar facet arthropathy. Prominent atherosclerosis. IMPRESSION: 1. Nonobstructive bowel gas pattern. Moderate colonic gas. No abnormal stool retention. 2. Prominent atherosclerotic calcification. Electronically Signed   By: Monte Fantasia M.D.   On: 09/08/2016 15:36   US Renal Port  Result Date: 08/31/2016 CLINICAL DATA:  Acute onset of renal insufficiency. Initial encounter. EXAM: RENAL / URINARY TRACT ULTRASOUND COMPLETE COMPARISON:  Renal ultrasound performed 07/04/2016 FINDINGS: Right Kidney: Length: 11.6 cm. Echogenicity within normal limits. No mass or hydronephrosis visualized. Left Kidney:  Length: 11.8 cm. Echogenicity within normal limits. No mass or hydronephrosis visualized. Bladder: Decompressed, with a Foley catheter in place. IMPRESSION: Unremarkable renal ultrasound.  No evidence of hydronephrosis. Electronically Signed   By: Garald Balding M.D.   On: 08/31/2016 03:13   Dg Chest Port 1 View  Result Date: 08/31/2016 CLINICAL DATA:  61 year old male with central line placement. EXAM: PORTABLE CHEST 1 VIEW COMPARISON:  Chest radiograph dated 08/30/2016 FINDINGS: There has been interval placement of a right IJ central line with tip over central SVC. There is no pneumothorax. Minimal bibasilar linear and platelike atelectatic changes noted. No focal consolidation, pleural effusion. The cardiac silhouette is within normal limits. No acute osseous pathology. Multiple surgical clips at the base of the neck likely from prior thyroid surgery. IMPRESSION: Interval placement of a right IJ central line with tip over central SVC. No pneumothorax. Electronically Signed   By: Anner Crete M.D.   On: 08/31/2016 00:29   US Abdomen Limited Ruq  Result Date: 09/09/2016 CLINICAL DATA:  Transaminitis EXAM: US ABDOMEN LIMITED - RIGHT UPPER QUADRANT COMPARISON:  None. FINDINGS: Gallbladder: No gallstones or wall thickening visualized. No sonographic Murphy sign noted by sonographer. Common bile duct: Diameter: 4.2 mm Liver: Increased parenchymal echogenicity. No mass or focal lesion. Liver normal in overall size. IMPRESSION: 1. No acute findings.  Normal gallbladder.  No bile duct dilation. 2. Increased liver parenchymal echogenicity consistent with hepatic steatosis. Electronically Signed   By: Dedra Skeens.D.  On: 09/09/2016 16:31    Micro Results     Recent Results (from the past 240 hour(s))  Urine culture     Status: None   Collection Time: 09/08/16 12:59 PM  Result Value Ref Range Status   Specimen Description URINE, CATHETERIZED  Final   Special Requests NONE  Final   Culture NO  GROWTH  Final   Report Status 09/09/2016 FINAL  Final  Blood Culture (routine x 2)     Status: None   Collection Time: 09/08/16  1:15 PM  Result Value Ref Range Status   Specimen Description BLOOD RIGHT HAND  Final   Special Requests BOTTLES DRAWN AEROBIC AND ANAEROBIC 5CC  Final   Culture NO GROWTH 5 DAYS  Final   Report Status 09/13/2016 FINAL  Final  Blood Culture (routine x 2)     Status: None   Collection Time: 09/08/16  1:15 PM  Result Value Ref Range Status   Specimen Description BLOOD RIGHT FOREARM  Final   Special Requests BOTTLES DRAWN AEROBIC AND ANAEROBIC 5CC  Final   Culture NO GROWTH 5 DAYS  Final   Report Status 09/13/2016 FINAL  Final  Gastrointestinal Panel by PCR , Stool     Status: None   Collection Time: 09/09/16  2:01 PM  Result Value Ref Range Status   Campylobacter species NOT DETECTED NOT DETECTED Final   Plesimonas shigelloides NOT DETECTED NOT DETECTED Final   Salmonella species NOT DETECTED NOT DETECTED Final   Yersinia enterocolitica NOT DETECTED NOT DETECTED Final   Vibrio species NOT DETECTED NOT DETECTED Final   Vibrio cholerae NOT DETECTED NOT DETECTED Final   Enteroaggregative E coli (EAEC) NOT DETECTED NOT DETECTED Final   Enteropathogenic E coli (EPEC) NOT DETECTED NOT DETECTED Final   Enterotoxigenic E coli (ETEC) NOT DETECTED NOT DETECTED Final   Shiga like toxin producing E coli (STEC) NOT DETECTED NOT DETECTED Final   Shigella/Enteroinvasive E coli (EIEC) NOT DETECTED NOT DETECTED Final   Cryptosporidium NOT DETECTED NOT DETECTED Final   Cyclospora cayetanensis NOT DETECTED NOT DETECTED Final   Entamoeba histolytica NOT DETECTED NOT DETECTED Final   Giardia lamblia NOT DETECTED NOT DETECTED Final   Adenovirus F40/41 NOT DETECTED NOT DETECTED Final   Astrovirus NOT DETECTED NOT DETECTED Final   Norovirus GI/GII NOT DETECTED NOT DETECTED Final   Rotavirus A NOT DETECTED NOT DETECTED Final   Sapovirus (I, II, IV, and V) NOT DETECTED NOT  DETECTED Final  C difficile quick scan w PCR reflex     Status: None   Collection Time: 09/09/16  2:01 PM  Result Value Ref Range Status   C Diff antigen NEGATIVE NEGATIVE Final   C Diff toxin NEGATIVE NEGATIVE Final   C Diff interpretation No C. difficile detected.  Final  MRSA PCR Screening     Status: Abnormal   Collection Time: 09/10/16  4:45 PM  Result Value Ref Range Status   MRSA by PCR POSITIVE (A) NEGATIVE Final    Comment:        The GeneXpert MRSA Assay (FDA approved for NASAL specimens only), is one component of a comprehensive MRSA colonization surveillance program. It is not intended to diagnose MRSA infection nor to guide or monitor treatment for MRSA infections. RESULT CALLED TO, READ BACK BY AND VERIFIED WITH: C WHITESIDE,RN AT 1923 09/10/16 BY L BENFIELD   C difficile quick scan w PCR reflex     Status: None   Collection Time: 09/14/16  9:49 AM  Result Value Ref Range Status   C Diff antigen NEGATIVE NEGATIVE Final   C Diff toxin NEGATIVE NEGATIVE Final   C Diff interpretation No C. difficile detected.  Final       Today   Subjective    Todd [name] today has been afebrile.  Eating ok.  Having normal bm.  Pt has condom catheter in place.  Pt states no headache,no chest abdominal pain,no new weakness tingling or numbness, feels much better wants to go to SNF today   Objective   Blood pressure 125/80, pulse 74, temperature 99.3 F (37.4 C), temperature source Oral, resp. rate 18, weight 58.1 kg (128 lb), SpO2 100 %.   Intake/Output Summary (Last 24 hours) at 09/16/16 1007 Last data filed at 09/16/16 0830  Gross per 24 hour  Intake           421.17 ml  Output             1301 ml  Net          -879.83 ml    Exam Awake Alert, Oriented x 3, No new F.N deficits, Normal affect [place].AT,PERRAL Supple Neck,No JVD, No cervical lymphadenopathy appriciated.  Symmetrical Chest wall movement, Good air movement bilaterally, CTAB RRR,No Gallops,Rubs or new  Murmurs, No Parasternal Heave +ve B.Sounds, Abd Soft, Non tender, No organomegaly appriciated, No rebound -guarding or rigidity. No Cyanosis, Clubbing or edema, No new Rash or bruise L bka,  Staples in place. (will need removal by ortho) Incision c/d/I except medial aspect of the left bka, slight dehiscence. 0.5cm Wound vac on right aka     Data Review   CBC w Diff: Lab Results  Component Value Date   WBC 14.2 (H) 09/16/2016   HGB 7.9 (L) 09/16/2016   HCT 25.0 (L) 09/16/2016   HCT 36.1 (L) 08/05/2016   PLT 813 (H) 09/16/2016   PLT 793 (H) 08/05/2016   LYMPHOPCT 13 09/08/2016   MONOPCT 5 09/08/2016   EOSPCT 1 09/08/2016   BASOPCT 0 09/08/2016    CMP: Lab Results  Component Value Date   NA 138 09/16/2016   NA 136 08/03/2016   K 3.4 (L) 09/16/2016   CL 107 09/16/2016   CO2 20 (L) 09/16/2016   BUN 8 09/16/2016   BUN 30 (H) 08/03/2016   CREATININE 0.95 09/16/2016   PROT 7.1 09/14/2016   PROT 8.0 08/03/2016   ALBUMIN 1.5 (L) 09/14/2016   ALBUMIN 3.1 (L) 08/03/2016   BILITOT 0.8 09/14/2016   BILITOT 0.8 08/03/2016   ALKPHOS 227 (H) 09/14/2016   AST 70 (H) 09/14/2016   ALT 42 09/14/2016  .   Total Time in preparing paper work, data evaluation and todays exam - 24 minutes  Jani Gravel M.D on 09/16/2016 at 10:07 AM  [facility]   Office  [phone]

## 2016-09-19 ENCOUNTER — Encounter: Payer: Self-pay | Admitting: Adult Health

## 2016-09-19 ENCOUNTER — Non-Acute Institutional Stay (SKILLED_NURSING_FACILITY): Payer: BLUE CROSS/BLUE SHIELD | Admitting: Adult Health

## 2016-09-19 DIAGNOSIS — R531 Weakness: Secondary | ICD-10-CM | POA: Diagnosis not present

## 2016-09-19 DIAGNOSIS — R740 Nonspecific elevation of levels of transaminase and lactic acid dehydrogenase [LDH]: Secondary | ICD-10-CM | POA: Diagnosis not present

## 2016-09-19 DIAGNOSIS — I10 Essential (primary) hypertension: Secondary | ICD-10-CM

## 2016-09-19 DIAGNOSIS — D75839 Thrombocytosis, unspecified: Secondary | ICD-10-CM

## 2016-09-19 DIAGNOSIS — Z794 Long term (current) use of insulin: Secondary | ICD-10-CM

## 2016-09-19 DIAGNOSIS — M1A9XX0 Chronic gout, unspecified, without tophus (tophi): Secondary | ICD-10-CM

## 2016-09-19 DIAGNOSIS — E440 Moderate protein-calorie malnutrition: Secondary | ICD-10-CM | POA: Diagnosis not present

## 2016-09-19 DIAGNOSIS — I2510 Atherosclerotic heart disease of native coronary artery without angina pectoris: Secondary | ICD-10-CM

## 2016-09-19 DIAGNOSIS — E1165 Type 2 diabetes mellitus with hyperglycemia: Secondary | ICD-10-CM

## 2016-09-19 DIAGNOSIS — N183 Chronic kidney disease, stage 3 unspecified: Secondary | ICD-10-CM

## 2016-09-19 DIAGNOSIS — IMO0002 Reserved for concepts with insufficient information to code with codable children: Secondary | ICD-10-CM

## 2016-09-19 DIAGNOSIS — D638 Anemia in other chronic diseases classified elsewhere: Secondary | ICD-10-CM

## 2016-09-19 DIAGNOSIS — E039 Hypothyroidism, unspecified: Secondary | ICD-10-CM

## 2016-09-19 DIAGNOSIS — A419 Sepsis, unspecified organism: Secondary | ICD-10-CM | POA: Diagnosis not present

## 2016-09-19 DIAGNOSIS — E1121 Type 2 diabetes mellitus with diabetic nephropathy: Secondary | ICD-10-CM | POA: Diagnosis not present

## 2016-09-19 DIAGNOSIS — K529 Noninfective gastroenteritis and colitis, unspecified: Secondary | ICD-10-CM | POA: Diagnosis not present

## 2016-09-19 DIAGNOSIS — I5022 Chronic systolic (congestive) heart failure: Secondary | ICD-10-CM

## 2016-09-19 DIAGNOSIS — E785 Hyperlipidemia, unspecified: Secondary | ICD-10-CM

## 2016-09-19 DIAGNOSIS — L8945 Pressure ulcer of contiguous site of back, buttock and hip, unstageable: Secondary | ICD-10-CM

## 2016-09-19 DIAGNOSIS — R7401 Elevation of levels of liver transaminase levels: Secondary | ICD-10-CM

## 2016-09-19 DIAGNOSIS — L89150 Pressure ulcer of sacral region, unstageable: Secondary | ICD-10-CM

## 2016-09-19 DIAGNOSIS — D473 Essential (hemorrhagic) thrombocythemia: Secondary | ICD-10-CM

## 2016-09-19 DIAGNOSIS — N400 Enlarged prostate without lower urinary tract symptoms: Secondary | ICD-10-CM

## 2016-09-19 DIAGNOSIS — E1152 Type 2 diabetes mellitus with diabetic peripheral angiopathy with gangrene: Secondary | ICD-10-CM | POA: Diagnosis not present

## 2016-09-19 NOTE — Progress Notes (Signed)
DATE:  09/19/2016   MRN:  [id number]  BIRTHDAY: Jan 01, 1956  Facility:  Nursing Home Location:  [place] Room Number: 093-G  LEVEL OF CARE:  SNF (31)  Contact Information    Name Relation Home Work [place] Mother [phone]     Cox,Dean Sister   [phone]   Bonnielee Haff [phone]  [phone]   Daleen Snook  [phone]     [place] Sister   [phone]       Code Status History    Date Active Date Inactive Code Status Order ID Comments User Context   09/08/2016  3:08 PM 09/16/2016  6:16 PM Full Code [id number]  Elwin Mocha, MD ED   08/30/2016  9:30 PM 09/04/2016  6:42 PM Full Code [id number]  Omar Person, NP Inpatient   08/08/2016  8:56 PM 08/19/2016  7:38 PM Full Code [id number]  Karmen Bongo, MD Inpatient   07/04/2016  2:04 AM 07/06/2016  6:41 PM Full Code [id number]  Phillips Grout, MD Inpatient   12/20/2015  3:40 PM 12/22/2015  9:52 PM Full Code [id number]  Samuella Cota, MD Inpatient       Chief Complaint  Patient presents with  . Hospitalization Follow-up    HISTORY OF PRESENT ILLNESS:  This is a 61-YO male seen for hospital follow-up.  He was admitted to [facility] on 09/16/2016 for short-term rehabilitation following an admission at [facility] 09/08/2016-09/16/2016 for a right BKA infection S/P Right AKA on 09/12/16. He had a right BKA on 08/17/16 and left BKA on 08/12/16.  His creatinine went up to 2.1 and is now wnl. He was having chronic diarrhea and c-diff was negative. Patient verbalized that he had diarrhea X 3 yesterday. Review of current  meds showed that he is on Colcrys BID for gout. He has PMH of gout, hypertension and hyperlipidemia.    PAST MEDICAL HISTORY:  Past Medical History:  Diagnosis Date  . AKI (acute kidney injury) ([place])   . Constipated   . Diabetes mellitus without complication ([place])   . Diarrhea   . Elevated LFTs   . Goiter   . Gout   .  Hyperlipidemia   . Hypertension   . Leukocytosis   . Reactive thrombocytosis   . Right BKA infection ([place]) 08/2016  . Right leg pain   . Sepsis due to undetermined organism ([place])   . Thyroid disease   . Wound infection after surgery 08/2016     CURRENT MEDICATIONS: Reviewed  Patient's Medications  New Prescriptions   No medications on file  Previous Medications   ACETAMINOPHEN (TYLENOL) 325 MG TABLET    Take 2 tablets (650 mg total) by mouth every 6 (six) hours as needed for mild pain (or Fever >/= 101).   ASPIRIN EC 325 MG EC TABLET    Take 1 tablet (325 mg total) by mouth daily.   ATORVASTATIN (LIPITOR) 40 MG TABLET    Take 1 tablet (40 mg total) by mouth daily at 6 PM.   CARVEDILOL (COREG) 3.125 MG TABLET    Take 1 tablet (3.125 mg total) by mouth 2 (two) times daily with a meal.   COLCRYS 0.6 MG TABLET    Take 0.6 mg by mouth 2 (two) times daily.   COLLAGENASE (SANTYL) OINTMENT    Apply topically daily.   INSULIN GLARGINE (LANTUS SOLOSTAR) 100 UNIT/ML SOLOSTAR PEN    Inject 10 Units into the skin daily.  LEVOTHYROXINE (SYNTHROID, LEVOTHROID) 100 MCG TABLET    Take 1 tablet (100 mcg total) by mouth daily before breakfast.   LOPERAMIDE (IMODIUM) 2 MG CAPSULE    Take 4 mg by mouth as needed for diarrhea or loose stools.   NUTRITIONAL SUPPLEMENT LIQD    Take 120 mLs by mouth 2 (two) times daily between meals.   OXYCODONE-ACETAMINOPHEN (PERCOCET/ROXICET) 5-325 MG TABLET    Take 1 tablet by mouth every 4 (four) hours as needed for severe pain.   PREGABALIN (LYRICA) 200 MG CAPSULE    Take 1 capsule (200 mg total) by mouth daily.   TAMSULOSIN (FLOMAX) 0.4 MG CAPS CAPSULE    Take 0.4 mg by mouth daily.  Modified Medications   No medications on file  Discontinued Medications   FEEDING SUPPLEMENT, GLUCERNA SHAKE, (GLUCERNA SHAKE) LIQD    Take 237 mLs by mouth 2 (two) times daily between meals.     Allergies  Allergen Reactions  . Lisinopril Other (See Comments)    Hyperkalemia /  Renal failure     REVIEW OF SYSTEMS:  GENERAL: no change in appetite, no fatigue, no weight changes, no fever, chills or weakness EYES: Denies change in vision, dry eyes, eye pain, itching or discharge EARS: Denies change in hearing, ringing in ears, or earache NOSE: Denies nasal congestion or epistaxis MOUTH and THROAT: Denies oral discomfort, gingival pain or bleeding, pain from teeth or hoarseness   RESPIRATORY: no cough, SOB, DOE, wheezing, hemoptysis CARDIAC: no chest pain, edema or palpitations GI: no abdominal pain, diarrhea, constipation, heart burn, nausea or vomiting GU: Denies dysuria, frequency, hematuria, incontinence, or discharge PSYCHIATRIC: Denies feeling of depression or anxiety. No report of hallucinations, insomnia, paranoia, or agitation    PHYSICAL EXAMINATION  GENERAL APPEARANCE: Well nourished. In no acute distress. Normal body habitus SKIN:  Right AKA stump attached to wound vac, left stump covered with dressing and ACE wrap, has unstageable sacral pressure ulcer and unstageable right  gluteal HEAD: Normal in size and contour. No evidence of trauma EYES: Lids open and close normally. No blepharitis, entropion or ectropion. PERRL. Conjunctivae are clear and sclerae are white. Lenses are without opacity EARS: Pinnae are normal. Patient hears normal voice tunes of the examiner MOUTH and THROAT: Lips are without lesions. Oral mucosa is moist and without lesions. Tongue is normal in shape, size, and color and without lesions NECK: supple, trachea midline, no neck masses, no thyroid tenderness, no thyromegaly LYMPHATICS: no LAN in the neck, no supraclavicular LAN RESPIRATORY: breathing is even & unlabored, BS CTAB CARDIAC: RRR, no murmur,no extra heart sounds, no edema GI: abdomen soft, normal BS, no masses, no tenderness, no hepatomegaly, no splenomegaly EXTREMITIES:  Able to move X X 4 extremities PSYCHIATRIC: Alert and oriented X 3. Affect and behavior are  appropriate   LABS/RADIOLOGY: Labs reviewed: Basic Metabolic Panel:  Recent Labs  08/31/16 0131  09/01/16 0355  09/03/16 0622 09/04/16 0526  09/09/16 0418  09/14/16 0550 09/15/16 1055 09/16/16 0500  NA 135  < > 136  < > 134* 134*  < > 135  < > 136 136 138  K 6.1*  < > 4.2  < > 3.7 3.6  < > 4.0  < > 3.5 3.5 3.4*  CL 102  < > 98*  < > 101 98*  < > 108  < > 106 109 107  CO2 17*  < > 27  < > 21* 21*  < > 15*  < > 22 19* 20*  GLUCOSE 86  < > 101*  < > 101* 113*  < > 111*  < > 95 77 96  BUN 101*  < > 26*  < > 23* 25*  < > 29*  < > 9 6 8   CREATININE 4.99*  < > 2.14*  < > 1.55* 1.65*  < > 1.48*  < > 1.11 0.94 0.95  CALCIUM 8.6*  < > 7.8*  < > 8.1* 8.3*  < > 7.5*  < > 7.6* 7.4* 7.3*  MG 2.4  --  2.1  --   --   --   --  1.8  --   --   --   --   PHOS 7.4*  < > 3.5  < > 5.4* 5.9*  --  2.9  --   --   --   --   < > = values in this interval not displayed. Liver Function Tests:  Recent Labs  09/10/16 0345 09/13/16 0650 09/14/16 0550  AST 58* 37 70*  ALT 59 31 42  ALKPHOS 263* 188* 227*  BILITOT 0.6 0.6 0.8  PROT 7.2 7.3 7.1  ALBUMIN 1.6* 1.6* 1.5*    Recent Labs  12/20/15 0934  LIPASE 69*   CBC:  Recent Labs  08/05/16 1630 08/08/16 1617  09/08/16 1259  09/14/16 0550 09/15/16 1055 09/16/16 0500  WBC 26.9* 22.7*  < > 25.1*  < > 18.1* 15.6* 14.2*  NEUTROABS 20.7* 17.7*  --  20.2*  --   --   --   --   HGB  --  12.5*  < > 8.6*  < > 8.3* 8.4* 7.9*  HCT 36.1* 36.3*  < > 26.1*  < > 25.8* 26.6* 25.0*  MCV 84 84.0  < > 80.3  < > 81.1 81.6 81.7  PLT 793* 710*  < > 759*  < > 808* 813* 813*  < > = values in this interval not displayed. Lipid Panel:  Recent Labs  05/24/16 0803  HDL 41   Cardiac Enzymes:  Recent Labs  08/30/16 2155 08/31/16 0131 08/31/16 0757  CKTOTAL  --  3,569*  --   TROPONINI 0.04* 0.03* 0.04*   CBG:  Recent Labs  09/15/16 2151 09/16/16 0638 09/16/16 1025  GLUCAP 108* 91 106*      Dg Knee 1-2 Views Right  Result Date:  09/08/2016 CLINICAL DATA:  Recent BKA.  Signs of infection now. EXAM: RIGHT KNEE - 1-2 VIEW COMPARISON:  Right knee images of March 04, 2014. FINDINGS: The patient has undergone below-the-knee amputation. The surgical bony margins appear sharp. There is soft tissue swelling over the stump. Surgical skin staples are present. No definite gas collections are observed. IMPRESSION: No objective evidence of osteomyelitis is observed. Electronically Signed   By: David  Martinique M.D.   On: 09/08/2016 13:55   Dg Abd 1 View  Result Date: 09/08/2016 CLINICAL DATA:  Constipation and diarrhea for several days EXAM: ABDOMEN - 1 VIEW COMPARISON:  None. FINDINGS: No abnormal stool retention. There is a moderate volume of colonic gas mainly seen from the ascending segment and distal transverse segment. No evidence of small bowel obstruction. Atherosclerotic calcification. No concerning mass effect. Density over the right flank at the level of L3-4 is indeterminate on this single film. Lower lumbar facet arthropathy. Prominent atherosclerosis. IMPRESSION: 1. Nonobstructive bowel gas pattern. Moderate colonic gas. No abnormal stool retention. 2. Prominent atherosclerotic calcification. Electronically Signed   By: Monte Fantasia M.D.   On: 09/08/2016 15:36  US Renal Port  Result Date: 08/31/2016 CLINICAL DATA:  Acute onset of renal insufficiency. Initial encounter. EXAM: RENAL / URINARY TRACT ULTRASOUND COMPLETE COMPARISON:  Renal ultrasound performed 07/04/2016 FINDINGS: Right Kidney: Length: 11.6 cm. Echogenicity within normal limits. No mass or hydronephrosis visualized. Left Kidney: Length: 11.8 cm. Echogenicity within normal limits. No mass or hydronephrosis visualized. Bladder: Decompressed, with a Foley catheter in place. IMPRESSION: Unremarkable renal ultrasound.  No evidence of hydronephrosis. Electronically Signed   By: Garald Balding M.D.   On: 08/31/2016 03:13   Dg Chest Port 1 View  Result Date:  08/31/2016 CLINICAL DATA:  61 year old male with central line placement. EXAM: PORTABLE CHEST 1 VIEW COMPARISON:  Chest radiograph dated 08/30/2016 FINDINGS: There has been interval placement of a right IJ central line with tip over central SVC. There is no pneumothorax. Minimal bibasilar linear and platelike atelectatic changes noted. No focal consolidation, pleural effusion. The cardiac silhouette is within normal limits. No acute osseous pathology. Multiple surgical clips at the base of the neck likely from prior thyroid surgery. IMPRESSION: Interval placement of a right IJ central line with tip over central SVC. No pneumothorax. Electronically Signed   By: Anner Crete M.D.   On: 08/31/2016 00:29   US Abdomen Limited Ruq  Result Date: 09/09/2016 CLINICAL DATA:  Transaminitis EXAM: US ABDOMEN LIMITED - RIGHT UPPER QUADRANT COMPARISON:  None. FINDINGS: Gallbladder: No gallstones or wall thickening visualized. No sonographic Murphy sign noted by sonographer. Common bile duct: Diameter: 4.2 mm Liver: Increased parenchymal echogenicity. No mass or focal lesion. Liver normal in overall size. IMPRESSION: 1. No acute findings.  Normal gallbladder.  No bile duct dilation. 2. Increased liver parenchymal echogenicity consistent with hepatic steatosis. Electronically Signed   By: Lajean Manes M.D.   On: 09/09/2016 16:31    ASSESSMENT/PLAN:  Generalized weakness - for rehabilitation, PT and OT, for therapeutic strengthening exercises; fall precautions  Sepsis - secondary to right BKA wound infection, had right BKA on 08/17/16 which got infected and S/P right AKA on 09/12/16. He has a wound vac attached to right AKA, WBC trending down, check CBC on 09/20/16; will have physiatry consult; follow-up with Dr. Sharol Given, orthopedic surgeon, in 1 week  Chronic kidney disease, stage III - re-check CBC on 09/20/16 Lab Results  Component Value Date   CREATININE 0.95 93/71/6967   Chronic systolic heart failure -  echocardiogram 3/23 with EF 45-50%, no chest pains; weigh Q Mondays-Wednesdays-Fridays  Transaminitis - no RUQ pain; re-check liver function test  CAD - continue aspirin 325 mg EC 1 tab by mouth daily  Protein calorie malnutrition, moderate - RD consult;  continue med Pass sugar-free 120 mL twice a day  Chronic diarrhea - was c-diff negative, not complaining of any gouty pain at present, will change Colcrys to PRN for now ; continue loperamide 2 mg 2 tabs = 4 mg by when necessary  Gout - not complaining of any gouty pain at present, will change Colcrys to PRN for now   Gangrene associated with diabetes mellitus - S/P bilateral BKA follow-up with Dr. Sharol Given, orthopedic surgeon, in 1 week ; continue Lyrica 200 mg 1 capsule by mouth daily, Percocet 5/325 mg 1 tab by mouth every 4 hours when necessary and Tylenol 2 tabs = 650 mg by mouth every 6 hours when necessary for pain    Essential hypertension - continue Coreg 3.125 mg 1 tab by mouth twice a day   Diabetes mellitus, type II - continue Lantus 100 units/mL inject  10 units subcutaneous daily Lab Results  Component Value Date   HGBA1C 6.8 (H) 08/08/2016   BPH - continue Flomax 0.4 mg 1 capsule by mouth daily  Sacral pressure ulcer, unstageable and right gluteal unstageable pressure ulcer - continue Santyll topically daily, air mattress, keep skin clean and dry  Hyperlipidemia - continue atorvastatin 40 mg 1 tab by mouth daily  Hypothyroidism - continue levothyroxine 100 g 1 tab by mouth daily Lab Results  Component Value Date   TSH 1.726 08/08/2016   Anemia of chronic disease - recheck CBC Lab Results  Component Value Date   HGB 7.9 (L) 09/16/2016   Thrombocytosis - will re-check  platelet     Goals of care:  Short-term rehabilitation   Monina C. [place] - NP    [employer] [phone]

## 2016-09-20 ENCOUNTER — Encounter: Payer: Self-pay | Admitting: Internal Medicine

## 2016-09-20 ENCOUNTER — Non-Acute Institutional Stay (SKILLED_NURSING_FACILITY): Payer: BLUE CROSS/BLUE SHIELD | Admitting: Internal Medicine

## 2016-09-20 DIAGNOSIS — I5022 Chronic systolic (congestive) heart failure: Secondary | ICD-10-CM | POA: Diagnosis not present

## 2016-09-20 DIAGNOSIS — R2681 Unsteadiness on feet: Secondary | ICD-10-CM

## 2016-09-20 DIAGNOSIS — Z794 Long term (current) use of insulin: Secondary | ICD-10-CM

## 2016-09-20 DIAGNOSIS — T814XXA Infection following a procedure, initial encounter: Secondary | ICD-10-CM

## 2016-09-20 DIAGNOSIS — E43 Unspecified severe protein-calorie malnutrition: Secondary | ICD-10-CM

## 2016-09-20 DIAGNOSIS — R531 Weakness: Secondary | ICD-10-CM

## 2016-09-20 DIAGNOSIS — D72829 Elevated white blood cell count, unspecified: Secondary | ICD-10-CM | POA: Diagnosis not present

## 2016-09-20 DIAGNOSIS — I739 Peripheral vascular disease, unspecified: Secondary | ICD-10-CM | POA: Diagnosis not present

## 2016-09-20 DIAGNOSIS — L8945 Pressure ulcer of contiguous site of back, buttock and hip, unstageable: Secondary | ICD-10-CM

## 2016-09-20 DIAGNOSIS — N183 Chronic kidney disease, stage 3 unspecified: Secondary | ICD-10-CM

## 2016-09-20 DIAGNOSIS — E039 Hypothyroidism, unspecified: Secondary | ICD-10-CM

## 2016-09-20 DIAGNOSIS — IMO0001 Reserved for inherently not codable concepts without codable children: Secondary | ICD-10-CM

## 2016-09-20 DIAGNOSIS — D62 Acute posthemorrhagic anemia: Secondary | ICD-10-CM

## 2016-09-20 DIAGNOSIS — E1140 Type 2 diabetes mellitus with diabetic neuropathy, unspecified: Secondary | ICD-10-CM

## 2016-09-20 DIAGNOSIS — N400 Enlarged prostate without lower urinary tract symptoms: Secondary | ICD-10-CM

## 2016-09-20 LAB — HEPATIC FUNCTION PANEL
ALK PHOS: 219 U/L — AB (ref 25–125)
ALT: 26 U/L (ref 10–40)
AST: 28 U/L (ref 14–40)
BILIRUBIN, TOTAL: 0.4 mg/dL

## 2016-09-20 LAB — CBC AND DIFFERENTIAL
HCT: 29 % — AB (ref 41–53)
Hemoglobin: 9.1 g/dL — AB (ref 13.5–17.5)
Neutrophils Absolute: 7 /uL
PLATELETS: 719 10*3/uL — AB (ref 150–399)
WBC: 11.2 10*3/mL

## 2016-09-20 LAB — BASIC METABOLIC PANEL
BUN: 12 mg/dL (ref 4–21)
Creatinine: 0.8 mg/dL (ref 0.6–1.3)
GLUCOSE: 89 mg/dL
Potassium: 4.7 mmol/L (ref 3.4–5.3)
Sodium: 137 mmol/L (ref 137–147)

## 2016-09-20 NOTE — Progress Notes (Signed)
LOCATION: [facility]  PCP: [name], MD   Code Status: Full Code  Goals of care: Advanced Directive information Advanced Directives 09/09/2016  Does Patient Have a Medical Advance Directive? -  Would patient like information on creating a medical advance directive? No - Patient declined       Extended Emergency Contact Information Primary Emergency Contact: Whalen, [place] Phone: [phone] Relation: Mother Secondary Emergency Contact: Cox,Dean  [place] Mobile Phone: [phone] Relation: Sister   Allergies  Allergen Reactions  . Lisinopril Other (See Comments)    Hyperkalemia / Renal failure    Chief Complaint  Patient presents with  . New Admit To SNF    New Admission Visit      HPI:  Patient is a 61 y.o. male seen today for short term rehabilitation post hospital admission from 09/08/2016-09/16/2016 with wound infection to right below-knee amputation site and sepsis. He was placed on IV antibiotics and seen by orthopedic. He now has of one pack placed to the wound site. He has medical history of chronic systolic dysfunction, chronic kidney disease stage III, peripheral vascular disease, hypertension, type 2 DM among others. Of note patient was in the hospital from 08/30/2016-09/04/2016 with hypotension and acute renal failure. He required CRRT and was seen by cardiology and nephrology service that admission. He was then discharged to [facility] skilled nursing facility. He is seen in his room today.  Review of Systems:  Constitutional: Negative for fever, chills, diaphoresis.  HENT: Negative for headache, congestion, nasal discharge, difficulty swallowing.   Eyes: Negative for blurred vision, double vision and discharge.  Respiratory: Negative for cough, shortness of breath and wheezing.   Cardiovascular: Negative for chest pain, palpitations, leg swelling.  Gastrointestinal: Negative for heartburn,  nausea, vomiting, abdominal pain, loss of appetite, melena. Last bowel movement was yesterday.  Genitourinary: Negative for dysuria and flank pain.  Musculoskeletal: Negative for back pain, fall in the facility.  pain medication has been helpful. Skin: Negative for itching, rash.  Neurological: Negative for dizziness. Psychiatric/Behavioral: Negative for depression.   Past Medical History:  Diagnosis Date  . AKI (acute kidney injury) ([place])   . Constipated   . Diabetes mellitus without complication ([place])   . Diarrhea   . Elevated LFTs   . Goiter   . Gout   . Hyperlipidemia   . Hypertension   . Leukocytosis   . Reactive thrombocytosis   . Right BKA infection ([place]) 08/2016  . Right leg pain   . Sepsis due to undetermined organism ([place])   . Thyroid disease   . Wound infection after surgery 08/2016   Past Surgical History:  Procedure Laterality Date  . ABDOMINAL AORTOGRAM N/A 08/11/2016   Procedure: Abdominal Aortogram;  Surgeon: Waynetta Sandy, MD;  Location: [place] [facility];  Service: Cardiovascular;  Laterality: N/A;  . ABDOMINAL AORTOGRAM W/LOWER EXTREMITY N/A 08/15/2016   Procedure: Abdominal Aortogram w/Lower Extremity;  Surgeon: Elam Dutch, MD;  Location: [place] [facility];  Service: Cardiovascular;  Laterality: N/A;  . AMPUTATION Left 08/12/2016   Procedure: LEFT BELOW KNEE AMPUTATION;  Surgeon: Newt Minion, MD;  Location: [place];  Service: Orthopedics;  Laterality: Left;  . AMPUTATION Right 08/17/2016   Procedure: RIGHT BELOW KNEE AMPUTATION;  Surgeon: Elam Dutch, MD;  Location: [place] [facility];  Service: Vascular;  Laterality: Right;  . AMPUTATION Right 09/12/2016   Procedure: AMPUTATION ABOVE KNEE;  Surgeon: Newt Minion, MD;  Location:  [facility];  Service: Orthopedics;  Laterality: Right;  . APPLICATION OF WOUND VAC Right 09/12/2016   Procedure: APPLICATION OF WOUND VAC ABOVE KNEE;  Surgeon: Newt Minion, MD;  Location: [facility];  Service: Orthopedics;  Laterality:  Right;  . LOWER EXTREMITY ANGIOGRAPHY Bilateral 08/11/2016   Procedure: Lower Extremity Angiography;  Surgeon: Waynetta Sandy, MD;  Location: [facility];  Service: Cardiovascular;  Laterality: Bilateral;  . PERIPHERAL VASCULAR BALLOON ANGIOPLASTY Left 08/11/2016   Procedure: Peripheral Vascular Balloon Angioplasty;  Surgeon: Waynetta Sandy, MD;  Location: [facility] [facility];  Service: Cardiovascular;  Laterality: Left;  SFA  . THYROID SURGERY     Social History:   reports that he has been smoking Cigarettes.  He has a 33.75 pack-year smoking history. He has never used smokeless tobacco. He reports that he does not drink alcohol or use drugs.  Family History  Problem Relation Age of Onset  . Heart disease Mother   . Pneumonia Father   . Diabetes Maternal Aunt   . Diabetes Maternal Uncle     Medications: Allergies as of 09/20/2016      Reactions   Lisinopril Other (See Comments)   Hyperkalemia / Renal failure      Medication List       Accurate as of 09/20/16 11:51 AM. Always use your most recent med list.          acetaminophen 325 MG tablet Commonly known as:  TYLENOL Take 2 tablets (650 mg total) by mouth every 6 (six) hours as needed for mild pain (or Fever >/= 101).   aspirin 325 MG EC tablet Take 1 tablet (325 mg total) by mouth daily.   atorvastatin 40 MG tablet Commonly known as:  LIPITOR Take 1 tablet (40 mg total) by mouth daily at 6 PM.   carvedilol 3.125 MG tablet Commonly known as:  COREG Take 1 tablet (3.125 mg total) by mouth 2 (two) times daily with a meal.   COLCRYS 0.6 MG tablet Generic drug:  colchicine Take 0.6 mg by mouth 2 (two) times daily as needed.   collagenase ointment Commonly known as:  SANTYL Apply topically daily.   Insulin Glargine 100 UNIT/ML Solostar Pen Commonly known as:  LANTUS SOLOSTAR Inject 10 Units into the skin daily.   levothyroxine 100 MCG tablet Commonly known as:  SYNTHROID, LEVOTHROID Take  1 tablet (100 mcg total) by mouth daily before breakfast.   loperamide 2 MG capsule Commonly known as:  IMODIUM Take 4 mg by mouth as needed for diarrhea or loose stools.   NUTRITIONAL SUPPLEMENT Liqd Take 120 mLs by mouth 2 (two) times daily between meals.   oxyCODONE-acetaminophen 5-325 MG tablet Commonly known as:  PERCOCET/ROXICET Take 1 tablet by mouth every 4 (four) hours as needed for severe pain.   pregabalin 200 MG capsule Commonly known as:  LYRICA Take 1 capsule (200 mg total) by mouth daily.   tamsulosin 0.4 MG Caps capsule Commonly known as:  FLOMAX Take 0.4 mg by mouth daily.       Immunizations: Immunization History  Administered Date(s) Administered  . Influenza,inj,Quad PF,36+ Mos 05/20/2015  . Influenza-Unspecified 05/10/2011  . PPD Test 09/16/2016     Physical Exam: Vitals:   09/20/16 1147  BP: 124/73  Pulse: 76  Resp: 18  Temp: 97.6 F (36.4 C)  TempSrc: Oral  SpO2: 97%  Weight: 128 lb (58.1 kg)  Height: 5\' 8"  (1.727 m)   Body mass index is 19.46 kg/m.  General- Adult male, thin built, in no acute distress Head- normocephalic, atraumatic Nose- no maxillary or frontal sinus tenderness, no nasal discharge Throat- moist mucus membrane, missing teeth Eyes- PERRLA, EOMI, no pallor, no icterus, no discharge, normal conjunctiva, normal sclera Neck- no cervical lymphadenopathy Cardiovascular- normal s1,s2, no murmur Respiratory- bilateral clear to auscultation, no wheeze, no rhonchi, no crackles, no use of accessory muscles Abdomen- bowel sounds present, soft, non tender, no guarding or rigidity, has condom catheter in place Musculoskeletal- right above-knee amputation and left below-knee amputation, able to move his upper extremities, wound vac to right above-knee amputation site in place, dressing to left below-knee amputation site Neurological- alert and oriented to person, place and time Skin- warm and dry, left below-knee amputation  surgical incision has at both ends, few staples came out attached to the dressing, 3 staples on one side of her loose and hanging, this has been removed, purulent drainage noted at 1 opening, mild erythema around the surgical incision site noted Psychiatry- poor eye contact, participates in conversation    Labs reviewed: Basic Metabolic Panel:  Recent Labs  08/31/16 0131  09/01/16 0355  09/03/16 0622 09/04/16 0526  09/09/16 0418  09/14/16 0550 09/15/16 1055 09/16/16 0500  NA 135  < > 136  < > 134* 134*  < > 135  < > 136 136 138  K 6.1*  < > 4.2  < > 3.7 3.6  < > 4.0  < > 3.5 3.5 3.4*  CL 102  < > 98*  < > 101 98*  < > 108  < > 106 109 107  CO2 17*  < > 27  < > 21* 21*  < > 15*  < > 22 19* 20*  GLUCOSE 86  < > 101*  < > 101* 113*  < > 111*  < > 95 77 96  BUN 101*  < > 26*  < > 23* 25*  < > 29*  < > 9 6 8   CREATININE 4.99*  < > 2.14*  < > 1.55* 1.65*  < > 1.48*  < > 1.11 0.94 0.95  CALCIUM 8.6*  < > 7.8*  < > 8.1* 8.3*  < > 7.5*  < > 7.6* 7.4* 7.3*  MG 2.4  --  2.1  --   --   --   --  1.8  --   --   --   --   PHOS 7.4*  < > 3.5  < > 5.4* 5.9*  --  2.9  --   --   --   --   < > = values in this interval not displayed. Liver Function Tests:  Recent Labs  09/10/16 0345 09/13/16 0650 09/14/16 0550  AST 58* 37 70*  ALT 59 31 42  ALKPHOS 263* 188* 227*  BILITOT 0.6 0.6 0.8  PROT 7.2 7.3 7.1  ALBUMIN 1.6* 1.6* 1.5*    Recent Labs  12/20/15 0934  LIPASE 69*   No results for input(s): AMMONIA in the last 8760 hours. CBC:  Recent Labs  08/05/16 1630 08/08/16 1617  09/08/16 1259  09/14/16 0550 09/15/16 1055 09/16/16 0500  WBC 26.9* 22.7*  < > 25.1*  < > 18.1* 15.6* 14.2*  NEUTROABS 20.7* 17.7*  --  20.2*  --   --   --   --   HGB  --  12.5*  < > 8.6*  < > 8.3* 8.4* 7.9*  HCT 36.1* 36.3*  < > 26.1*  < >  25.8* 26.6* 25.0*  MCV 84 84.0  < > 80.3  < > 81.1 81.6 81.7  PLT 793* 710*  < > 759*  < > 808* 813* 813*  < > = values in this interval not displayed. Cardiac  Enzymes:  Recent Labs  08/30/16 2155 08/31/16 0131 08/31/16 0757  CKTOTAL  --  3,569*  --   TROPONINI 0.04* 0.03* 0.04*   BNP: Invalid input(s): POCBNP CBG:  Recent Labs  09/15/16 2151 09/16/16 0638 09/16/16 1025  GLUCAP 108* 91 106*    Radiological Exams: Dg Knee 1-2 Views Right  Result Date: 09/08/2016 CLINICAL DATA:  Recent BKA.  Signs of infection now. EXAM: RIGHT KNEE - 1-2 VIEW COMPARISON:  Right knee images of March 04, 2014. FINDINGS: The patient has undergone below-the-knee amputation. The surgical bony margins appear sharp. There is soft tissue swelling over the stump. Surgical skin staples are present. No definite gas collections are observed. IMPRESSION: No objective evidence of osteomyelitis is observed. Electronically Signed   By: David  Martinique M.D.   On: 09/08/2016 13:55   Dg Abd 1 View  Result Date: 09/08/2016 CLINICAL DATA:  Constipation and diarrhea for several days EXAM: ABDOMEN - 1 VIEW COMPARISON:  None. FINDINGS: No abnormal stool retention. There is a moderate volume of colonic gas mainly seen from the ascending segment and distal transverse segment. No evidence of small bowel obstruction. Atherosclerotic calcification. No concerning mass effect. Density over the right flank at the level of L3-4 is indeterminate on this single film. Lower lumbar facet arthropathy. Prominent atherosclerosis. IMPRESSION: 1. Nonobstructive bowel gas pattern. Moderate colonic gas. No abnormal stool retention. 2. Prominent atherosclerotic calcification. Electronically Signed   By: Monte Fantasia M.D.   On: 09/08/2016 15:36   US Renal Port  Result Date: 08/31/2016 CLINICAL DATA:  Acute onset of renal insufficiency. Initial encounter. EXAM: RENAL / URINARY TRACT ULTRASOUND COMPLETE COMPARISON:  Renal ultrasound performed 07/04/2016 FINDINGS: Right Kidney: Length: 11.6 cm. Echogenicity within normal limits. No mass or hydronephrosis visualized. Left Kidney: Length: 11.8 cm.  Echogenicity within normal limits. No mass or hydronephrosis visualized. Bladder: Decompressed, with a Foley catheter in place. IMPRESSION: Unremarkable renal ultrasound.  No evidence of hydronephrosis. Electronically Signed   By: Garald Balding M.D.   On: 08/31/2016 03:13   Dg Chest Port 1 View  Result Date: 08/31/2016 CLINICAL DATA:  61 year old male with central line placement. EXAM: PORTABLE CHEST 1 VIEW COMPARISON:  Chest radiograph dated 08/30/2016 FINDINGS: There has been interval placement of a right IJ central line with tip over central SVC. There is no pneumothorax. Minimal bibasilar linear and platelike atelectatic changes noted. No focal consolidation, pleural effusion. The cardiac silhouette is within normal limits. No acute osseous pathology. Multiple surgical clips at the base of the neck likely from prior thyroid surgery. IMPRESSION: Interval placement of a right IJ central line with tip over central SVC. No pneumothorax. Electronically Signed   By: Anner Crete M.D.   On: 08/31/2016 00:29   US Abdomen Limited Ruq  Result Date: 09/09/2016 CLINICAL DATA:  Transaminitis EXAM: US ABDOMEN LIMITED - RIGHT UPPER QUADRANT COMPARISON:  None. FINDINGS: Gallbladder: No gallstones or wall thickening visualized. No sonographic Murphy sign noted by sonographer. Common bile duct: Diameter: 4.2 mm Liver: Increased parenchymal echogenicity. No mass or focal lesion. Liver normal in overall size. IMPRESSION: 1. No acute findings.  Normal gallbladder.  No bile duct dilation. 2. Increased liver parenchymal echogenicity consistent with hepatic steatosis. Electronically Signed   By: Shanon Brow  Ormond M.D.   On: 09/09/2016 16:31    Assessment/Plan  Generalized weakness From physical deconditioning.Will have patient work with PT/OT as tolerated to regain strength and restore function.  Fall precautions are in place.  Unsteady gait With right above-knee amputation and left below-knee amputation. He remains a  high fall risk.Will have him work with physical therapy and occupational therapy team to help with gait training and muscle strengthening exercises.fall precautions. Skin care. Encourage to be out of bed.   Peripheral vascular disease Status post right above-the-knee amputation and left below-knee amputation. Continue Percocet 5-3 25 mg every 4 hours as needed for pain, Tylenol 650 mg every 6 hours as needed for pain and Lyrica 200 mg daily for nerve pain. Continue aspirin 325 mg daily and statin. PMR consult.  Surgical wound infection Status post antibiotic for right AKA site. Continue with wound VAC to right surgical site and follows with Dr. Sharol Given. Left surgical wound site looks infected. A few staples that were hanging loose have been removed. Will need to make appointment with Dr. Gavin Potters as soon as possible. Given purulent drainage, will start patient on antibiotic for empiric coverage. Start doxycycline 100 mg twice a day for now for 1 and monitor. Continue with daily dressing change to left below-knee amputation site. Monitor WBC and temperature curve.  Leukocytosis Infection to right BKA surgical site noted. Recently completed antibiotic for infection to right above-knee amputation site. Start antibiotic as above and monitor WBC and temperature curve.  Acute blood loss anemia Postoperative, monitor CBC  Severe Protein calorie malnutrition RD consult. Encourage oral intake. Monitor weekly weight for now. Continue med Pass supplements  Unstageable pressure ulcer to right ischium and sacrum [place] area with normal saline, pat dry the skin area and prep periwound area, allow this area to try and apply Santyl ointment to one side and cover with Allevyn dressing change daily. Pressure ulcer prophylaxis to be taken. At the [facility] white to help promote wound healing. Air mattress to be provided for pressure ulcer prophylaxis.  Chronic systolic congestive heart failure Continue Coreg 3.125 mg twice a  day  Type 2 diabetes mellitus with neuropathy Monitor blood sugar reading. Currently on Lantus 10 units daily. Continue atorvastatin 40 mg daily. Continue Lyrica 200 mg daily Lab Results  Component Value Date   HGBA1C 6.8 (H) 08/08/2016   BPH without urinary obstruction Continue tamsulosin 0.4 mg daily. Voiding well and has condom catheter in place  Hypothyroidism On levothyroxine 100 g daily, no changes made  ckd stage 3 Monitor BMP    Goals of care: short term rehabilitation   Labs/tests ordered: CBC with differential, CMP 09/21/2016  Family/ staff Communication: reviewed care plan with patient and nursing supervisor  I have spent greater than 50 minutes for this encounter which includes reviewing hospital records, addressing above mentioned concerns, reviewing care plan with patient, answering patient's concerns and counseling.     Blanchie Serve, MD Internal Medicine [facility] [address] [place], [place] [address] Cell Phone (Monday-Friday 8 am - 5 pm): [phone] On Call: [phone] and follow prompts after 5 pm and on weekends Office Phone: [phone] Office Fax: [phone]

## 2016-09-21 ENCOUNTER — Telehealth (INDEPENDENT_AMBULATORY_CARE_PROVIDER_SITE_OTHER): Payer: Self-pay | Admitting: *Deleted

## 2016-09-21 ENCOUNTER — Encounter (INDEPENDENT_AMBULATORY_CARE_PROVIDER_SITE_OTHER): Payer: Self-pay | Admitting: Family

## 2016-09-21 ENCOUNTER — Ambulatory Visit (INDEPENDENT_AMBULATORY_CARE_PROVIDER_SITE_OTHER): Payer: BLUE CROSS/BLUE SHIELD | Admitting: Family

## 2016-09-21 DIAGNOSIS — Z89512 Acquired absence of left leg below knee: Secondary | ICD-10-CM

## 2016-09-21 DIAGNOSIS — Z89611 Acquired absence of right leg above knee: Secondary | ICD-10-CM | POA: Insufficient documentation

## 2016-09-21 LAB — BASIC METABOLIC PANEL
BUN: 16 mg/dL (ref 4–21)
CREATININE: 0.9 mg/dL (ref 0.6–1.3)
GLUCOSE: 117 mg/dL
POTASSIUM: 4.2 mmol/L (ref 3.4–5.3)
SODIUM: 139 mmol/L (ref 137–147)

## 2016-09-21 LAB — HEPATIC FUNCTION PANEL
ALK PHOS: 213 U/L — AB (ref 25–125)
ALT: 22 U/L (ref 10–40)
AST: 19 U/L (ref 14–40)
BILIRUBIN, TOTAL: 0.4 mg/dL

## 2016-09-21 NOTE — Progress Notes (Signed)
Office Visit Note   Patient: Todd [name]           Date of Birth: Mar 31, 1956           MRN: [id number] Visit Date: 09/21/2016              Requested by: [name], MD [place],  [id number] PCP: [name], MD  Chief Complaint  Patient presents with  . Right Leg - Routine Post Op    09/12/16 right above the knee amputation       HPI: The patient is a 61 year old gentleman who presents today as post right above-the-knee amputation with wound VAC application on 37/34/2876. He is also status post left below the knee amputation on 08/12/2016. Today the wound VAC was removed from the above-the-knee amputation. Patient is residing at [facility] for his rehabilitation. No concerns voiced today. States they are doing dry dressing changes to the left below the knee amputation daily.  This is his first postop appointment.  Assessment & Plan: Visit Diagnoses:  1. History of right above knee amputation ([place])   2. Acquired absence of left leg below knee ([facility])     Plan: Him cyst staples and sutures harvested from the left below the knee amputation today. Continue with daily wound care. They'll apply Silvadene and pack the medial ulcer open. For the right lower extremity apply dry dressing daily. Apply shrinkers to bilateral lower extremities once obtained. Vitamin order to [employer]otech for the bilateral lower extremity prosthetics. Patient will make a great K2 level ambulator.   Follow-Up Instructions: Return in about 2 weeks (around 10/05/2016).   Ortho Exam  Patient is alert, oriented, no adenopathy, well-dressed, normal affect, normal respiratory effort. Him the right above-the-knee amputation is well approximated with staples there is no gaping no drainage no odor no erythema no sign of infection. The left below the knee amputation is well-healed centrally there is a shallow ulceration to the lateral aspect this has bleeding granulation tissue in the wound bed and is  about a nickel-sized. Medially there is a dime-sized area that is 3 mm deep there are exposed sutures these were harvested today. There is some eschar which was debrided with a 10 blade knife. There is bleeding granulation tissue in the wound bed no surrounding erythema no drainage no odor no sign of infection.  Imaging: No results found.  Labs: Lab Results  Component Value Date   HGBA1C 6.8 (H) 08/08/2016   HGBA1C 14.3 (H) 12/21/2015   ESRSEDRATE 138 (H) 09/08/2016   ESRSEDRATE 117 (H) 08/08/2016   CRP 7.1 (H) 09/08/2016   CRP 3.2 (H) 08/08/2016   LABURIC 9.2 (H) 07/11/2016   LABURIC 11.4 (H) 07/04/2016   LABURIC 10.0 (H) 06/21/2016   REPTSTATUS 09/13/2016 FINAL 09/08/2016   REPTSTATUS 09/13/2016 FINAL 09/08/2016   CULT NO GROWTH 5 DAYS 09/08/2016   CULT NO GROWTH 5 DAYS 09/08/2016    Orders:  No orders of the defined types were placed in this encounter.  No orders of the defined types were placed in this encounter.    Procedures: No procedures performed  Clinical Data: No additional findings.  ROS:  All other systems negative, except as noted in the HPI. Review of Systems  Constitutional: Negative for chills and fever.    Objective: Vital Signs: Ht 5\' 8"  (1.727 m)   Wt 128 lb (58.1 kg)   BMI 19.46 kg/m   Specialty Comments:  No specialty comments available.  PMFS History: Patient Active  Problem List   Diagnosis Date Noted  . History of right above knee amputation ([place]) 09/21/2016  . Acquired absence of left leg below knee ([place]) 09/21/2016  . Diarrhea   . Transaminitis   . Respiratory failure ([place])   . Acute on chronic kidney failure ([place]) 08/30/2016  . Diabetic peripheral neuropathy ([place]) 07/21/2016  . Renal failure 07/04/2016  . Gout 07/04/2016  . Elevated troponin 07/04/2016  . Hyperuricemia 06/21/2016  . HTN (hypertension) 01/05/2016  . Diabetes type 2, uncontrolled ([place]) 01/05/2016  . AKI (acute kidney injury) ([place]) 12/20/2015  .  Hypothyroidism 05/10/2011   Past Medical History:  Diagnosis Date  . AKI (acute kidney injury) ([place])   . Constipated   . Diabetes mellitus without complication ([place])   . Diarrhea   . Elevated LFTs   . Goiter   . Gout   . Hyperlipidemia   . Hypertension   . Leukocytosis   . Reactive thrombocytosis   . Right BKA infection ([place]) 08/2016  . Right leg pain   . Sepsis due to undetermined organism ([place])   . Thyroid disease   . Wound infection after surgery 08/2016    Family History  Problem Relation Age of Onset  . Heart disease Mother   . Pneumonia Father   . Diabetes Maternal Aunt   . Diabetes Maternal Uncle     Past Surgical History:  Procedure Laterality Date  . ABDOMINAL AORTOGRAM N/A 08/11/2016   Procedure: Abdominal Aortogram;  Surgeon: Waynetta Sandy, MD;  Location: [place] [facility];  Service: Cardiovascular;  Laterality: N/A;  . ABDOMINAL AORTOGRAM W/LOWER EXTREMITY N/A 08/15/2016   Procedure: Abdominal Aortogram w/Lower Extremity;  Surgeon: Elam Dutch, MD;  Location: [place] [facility];  Service: Cardiovascular;  Laterality: N/A;  . AMPUTATION Left 08/12/2016   Procedure: LEFT BELOW KNEE AMPUTATION;  Surgeon: Newt Minion, MD;  Location: [facility];  Service: Orthopedics;  Laterality: Left;  . AMPUTATION Right 08/17/2016   Procedure: RIGHT BELOW KNEE AMPUTATION;  Surgeon: Elam Dutch, MD;  Location: [place];  Service: Vascular;  Laterality: Right;  . AMPUTATION Right 09/12/2016   Procedure: AMPUTATION ABOVE KNEE;  Surgeon: Newt Minion, MD;  Location: [place];  Service: Orthopedics;  Laterality: Right;  . APPLICATION OF WOUND VAC Right 09/12/2016   Procedure: APPLICATION OF WOUND VAC ABOVE KNEE;  Surgeon: Newt Minion, MD;  Location: [place];  Service: Orthopedics;  Laterality: Right;  . LOWER EXTREMITY ANGIOGRAPHY Bilateral 08/11/2016   Procedure: Lower Extremity Angiography;  Surgeon: Waynetta Sandy, MD;  Location: [place] [facility];  Service:  Cardiovascular;  Laterality: Bilateral;  . PERIPHERAL VASCULAR BALLOON ANGIOPLASTY Left 08/11/2016   Procedure: Peripheral Vascular Balloon Angioplasty;  Surgeon: Waynetta Sandy, MD;  Location: [place] [facility];  Service: Cardiovascular;  Laterality: Left;  SFA  . THYROID SURGERY     Social History   Occupational History  . drives a forklift    Social History Main Topics  . Smoking status: Current Every Day Smoker    Packs/day: 0.75    Years: 45.00    Types: Cigarettes    Last attempt to quit: 11/12/2014  . Smokeless tobacco: Never Used  . Alcohol use No     Comment: h/o heavy use; stopped drinking in 7/16  . Drug use: No  . Sexual activity: Not Currently

## 2016-09-21 NOTE — Telephone Encounter (Signed)
Up to rehab md

## 2016-09-21 NOTE — Telephone Encounter (Signed)
Pt sister calling asking how long pt will be in rehab facility.

## 2016-09-23 ENCOUNTER — Ambulatory Visit: Payer: BLUE CROSS/BLUE SHIELD | Admitting: Neurology

## 2016-09-23 ENCOUNTER — Inpatient Hospital Stay (INDEPENDENT_AMBULATORY_CARE_PROVIDER_SITE_OTHER): Payer: BLUE CROSS/BLUE SHIELD | Admitting: Family

## 2016-09-23 NOTE — Telephone Encounter (Signed)
I called spoke with Ms. Todd [name] had a discuss about the medical director and physical therapy team at the facility working together to determine when it will be safe for patient to return home. Advised we would have more answers for her the further we are with this process, as far as when a prosthetic can be fabricated and so forth.

## 2016-09-28 ENCOUNTER — Encounter: Payer: BLUE CROSS/BLUE SHIELD | Admitting: Vascular Surgery

## 2016-09-28 ENCOUNTER — Encounter (HOSPITAL_COMMUNITY): Payer: BLUE CROSS/BLUE SHIELD

## 2016-10-05 ENCOUNTER — Ambulatory Visit (INDEPENDENT_AMBULATORY_CARE_PROVIDER_SITE_OTHER): Payer: BLUE CROSS/BLUE SHIELD | Admitting: Family

## 2016-10-05 DIAGNOSIS — Z89512 Acquired absence of left leg below knee: Secondary | ICD-10-CM

## 2016-10-05 DIAGNOSIS — Z89611 Acquired absence of right leg above knee: Secondary | ICD-10-CM

## 2016-10-05 NOTE — Progress Notes (Signed)
Office Visit Note   Patient: Todd [name]           Date of Birth: 1955/12/27           MRN: [id number] Visit Date: 10/05/2016              Requested by: [name], MD [place], [place] [address] PCP: [name], MD  No chief complaint on file.     HPI: The patient is a 61 year old gentleman who presents today as post right above-the-knee amputation with wound VAC application on 94/76/5465. He is also status post left below the knee amputation on 08/12/2016. Patient is residing at [facility] for his rehabilitation. No concerns voiced today. States they are doing silvadene dressing changes to the left below the knee amputation daily. Dry dressings to right aka.  Assessment & Plan: Visit Diagnoses:  1. History of right above knee amputation ([place])   2. Acquired absence of left leg below knee (H[facility])     Plan: Sutures were harvested from the right above-the-knee amputation. Wear the shrinker but daily to this limb. Continue with Silvadene dressing changes to the left below the knee amputation. Continue shrinker over the dressing. Follow with biotech for fabrication of prosthetics. Follow-up in office in 4 weeks.  Follow-Up Instructions: Return in about 4 weeks (around 11/02/2016).   Ortho Exam  Patient is alert, oriented, no adenopathy, well-dressed, normal affect, normal respiratory effort. The right above-the-knee amputation is well healed. there is no gaping no drainage no odor no erythema no sign of infection. The left below the knee amputation is well-healed centrally. there is a shallow ulceration to the lateral aspect this has bleeding granulation tissue in the wound bed and is about a n1 cm in diameter. Medially there is a 5 millimeter in diameter area that is filled in with bleeding granulation tissue. A little bit more centrally there is a 1 cm in diameter area that is 1 mm deep filled in with fibrinous exudative tissue. There is no drainage. There is no  erythema to the residual limb no odor no sign of infection. Imaging: No results found.  Labs: Lab Results  Component Value Date   HGBA1C 6.8 (H) 08/08/2016   HGBA1C 14.3 (H) 12/21/2015   ESRSEDRATE 138 (H) 09/08/2016   ESRSEDRATE 117 (H) 08/08/2016   CRP 7.1 (H) 09/08/2016   CRP 3.2 (H) 08/08/2016   LABURIC 9.2 (H) 07/11/2016   LABURIC 11.4 (H) 07/04/2016   LABURIC 10.0 (H) 06/21/2016   REPTSTATUS 09/13/2016 FINAL 09/08/2016   REPTSTATUS 09/13/2016 FINAL 09/08/2016   CULT NO GROWTH 5 DAYS 09/08/2016   CULT NO GROWTH 5 DAYS 09/08/2016    Orders:  No orders of the defined types were placed in this encounter.  No orders of the defined types were placed in this encounter.    Procedures: No procedures performed  Clinical Data: No additional findings.  ROS:  All other systems negative, except as noted in the HPI. Review of Systems  Constitutional: Negative for chills and fever.    Objective: Vital Signs: There were no vitals taken for this visit.  Specialty Comments:  No specialty comments available.  PMFS History: Patient Active Problem List   Diagnosis Date Noted  . History of right above knee amputation ([place]) 09/21/2016  . Acquired absence of left leg below knee ([place]) 09/21/2016  . Diarrhea   . Transaminitis   . Respiratory failure ([place])   . Acute on chronic kidney failure ([place]) 08/30/2016  .  Diabetic peripheral neuropathy ([place]) 07/21/2016  . Renal failure 07/04/2016  . Gout 07/04/2016  . Elevated troponin 07/04/2016  . Hyperuricemia 06/21/2016  . HTN (hypertension) 01/05/2016  . Diabetes type 2, uncontrolled ([place]) 01/05/2016  . AKI (acute kidney injury) ([place]) 12/20/2015  . Hypothyroidism 05/10/2011   Past Medical History:  Diagnosis Date  . AKI (acute kidney injury) ([place])   . Constipated   . Diabetes mellitus without complication ([place])   . Diarrhea   . Elevated LFTs   . Goiter   . Gout   . Hyperlipidemia   . Hypertension   . Leukocytosis   .  Reactive thrombocytosis   . Right BKA infection ([place]) 08/2016  . Right leg pain   . Sepsis due to undetermined organism ([place])   . Thyroid disease   . Wound infection after surgery 08/2016    Family History  Problem Relation Age of Onset  . Heart disease Mother   . Pneumonia Father   . Diabetes Maternal Aunt   . Diabetes Maternal Uncle     Past Surgical History:  Procedure Laterality Date  . ABDOMINAL AORTOGRAM N/A 08/11/2016   Procedure: Abdominal Aortogram;  Surgeon: Waynetta Sandy, MD;  Location: [facility];  Service: Cardiovascular;  Laterality: N/A;  . ABDOMINAL AORTOGRAM W/LOWER EXTREMITY N/A 08/15/2016   Procedure: Abdominal Aortogram w/Lower Extremity;  Surgeon: Elam Dutch, MD;  Location: [facility];  Service: Cardiovascular;  Laterality: N/A;  . AMPUTATION Left 08/12/2016   Procedure: LEFT BELOW KNEE AMPUTATION;  Surgeon: Newt Minion, MD;  Location: [facility];  Service: Orthopedics;  Laterality: Left;  . AMPUTATION Right 08/17/2016   Procedure: RIGHT BELOW KNEE AMPUTATION;  Surgeon: Elam Dutch, MD;  Location: [facility];  Service: Vascular;  Laterality: Right;  . AMPUTATION Right 09/12/2016   Procedure: AMPUTATION ABOVE KNEE;  Surgeon: Newt Minion, MD;  Location: [facility];  Service: Orthopedics;  Laterality: Right;  . APPLICATION OF WOUND VAC Right 09/12/2016   Procedure: APPLICATION OF WOUND VAC ABOVE KNEE;  Surgeon: Newt Minion, MD;  Location: [facility];  Service: Orthopedics;  Laterality: Right;  . LOWER EXTREMITY ANGIOGRAPHY Bilateral 08/11/2016   Procedure: Lower Extremity Angiography;  Surgeon: Waynetta Sandy, MD;  Location: [facility];  Service: Cardiovascular;  Laterality: Bilateral;  . PERIPHERAL VASCULAR BALLOON ANGIOPLASTY Left 08/11/2016   Procedure: Peripheral Vascular Balloon Angioplasty;  Surgeon: Waynetta Sandy, MD;  Location: [facility];  Service: Cardiovascular;  Laterality: Left;  SFA  . THYROID SURGERY      Social History   Occupational History  . drives a forklift    Social History Main Topics  . Smoking status: Current Every Day Smoker    Packs/day: 0.75    Years: 45.00    Types: Cigarettes    Last attempt to quit: 11/12/2014  . Smokeless tobacco: Never Used  . Alcohol use No     Comment: h/o heavy use; stopped drinking in 7/16  . Drug use: No  . Sexual activity: Not Currently

## 2016-10-06 ENCOUNTER — Ambulatory Visit (INDEPENDENT_AMBULATORY_CARE_PROVIDER_SITE_OTHER): Payer: BLUE CROSS/BLUE SHIELD | Admitting: Cardiovascular Disease

## 2016-10-06 VITALS — BP 122/70 | HR 67

## 2016-10-06 DIAGNOSIS — Z9289 Personal history of other medical treatment: Secondary | ICD-10-CM | POA: Diagnosis not present

## 2016-10-06 DIAGNOSIS — I10 Essential (primary) hypertension: Secondary | ICD-10-CM | POA: Diagnosis not present

## 2016-10-06 DIAGNOSIS — I25708 Atherosclerosis of coronary artery bypass graft(s), unspecified, with other forms of angina pectoris: Secondary | ICD-10-CM | POA: Diagnosis not present

## 2016-10-06 DIAGNOSIS — E782 Mixed hyperlipidemia: Secondary | ICD-10-CM

## 2016-10-06 DIAGNOSIS — I429 Cardiomyopathy, unspecified: Secondary | ICD-10-CM

## 2016-10-06 NOTE — Progress Notes (Signed)
SUBJECTIVE: The patient presents for posthospitalization follow-up. He has a right BKA and is being scheduled for right AKA. He was recent hospitalized for a wound infection regarding this. He also has hypertension, diabetes, and chronic kidney disease stage III.   An echocardiogram performed in March 2018 showed newly reduced left ventricular ejection fraction of 45-50% with new wall motion abnormalities. He had some proximal septal hypokinesis according to Dr. Stanford Breed.  ECG on 08/08/16 showed diffuse T-wave inversions with a long QT interval.  He denies chest pain, shortness of breath, orthopnea, and palpitations. He denies a history of MI. He quit smoking one year ago.   Review of Systems: As per "subjective", otherwise negative.  Allergies  Allergen Reactions  . Lisinopril Other (See Comments)    Hyperkalemia / Renal failure    Current Outpatient Prescriptions  Medication Sig Dispense Refill  . acetaminophen (TYLENOL) 325 MG tablet Take 2 tablets (650 mg total) by mouth every 6 (six) hours as needed for mild pain (or Fever >/= 101).    [name] aspirin EC 325 MG EC tablet Take 1 tablet (325 mg total) by mouth daily. 30 tablet 0  . atorvastatin (LIPITOR) 40 MG tablet Take 1 tablet (40 mg total) by mouth daily at 6 PM.    . carvedilol (COREG) 3.125 MG tablet Take 1 tablet (3.125 mg total) by mouth 2 (two) times daily with a meal. 60 tablet 0  . COLCRYS 0.6 MG tablet Take 0.6 mg by mouth 2 (two) times daily as needed.   1  . collagenase (SANTYL) ointment Apply topically daily. 15 g 0  . Insulin Glargine (LANTUS SOLOSTAR) 100 UNIT/ML Solostar Pen Inject 10 Units into the skin daily. 15 mL 11  . levothyroxine (SYNTHROID, LEVOTHROID) 100 MCG tablet Take 1 tablet (100 mcg total) by mouth daily before breakfast. 30 tablet 0  . loperamide (IMODIUM) 2 MG capsule Take 4 mg by mouth as needed for diarrhea or loose stools.    . NUTRITIONAL SUPPLEMENT LIQD Take 120 mLs by mouth 2 (two) times  daily between meals.    [name] oxyCODONE-acetaminophen (PERCOCET/ROXICET) 5-325 MG tablet Take 1 tablet by mouth every 4 (four) hours as needed for severe pain. 10 tablet 0  . pregabalin (LYRICA) 200 MG capsule Take 1 capsule (200 mg total) by mouth daily. 60 capsule 2  . tamsulosin (FLOMAX) 0.4 MG CAPS capsule Take 0.4 mg by mouth daily.     No current facility-administered medications for this visit.     Past Medical History:  Diagnosis Date  . AKI (acute kidney injury) ([place])   . Constipated   . Diabetes mellitus without complication ([place])   . Diarrhea   . Elevated LFTs   . Goiter   . Gout   . Hyperlipidemia   . Hypertension   . Leukocytosis   . Reactive thrombocytosis   . Right BKA infection ([place]) 08/2016  . Right leg pain   . Sepsis due to undetermined organism ([place])   . Thyroid disease   . Wound infection after surgery 08/2016    Past Surgical History:  Procedure Laterality Date  . ABDOMINAL AORTOGRAM N/A 08/11/2016   Procedure: Abdominal Aortogram;  Surgeon: Waynetta Sandy, MD;  Location: [facility];  Service: Cardiovascular;  Laterality: N/A;  . ABDOMINAL AORTOGRAM W/LOWER EXTREMITY N/A 08/15/2016   Procedure: Abdominal Aortogram w/Lower Extremity;  Surgeon: Elam Dutch, MD;  Location: [facility];  Service: Cardiovascular;  Laterality: N/A;  .  AMPUTATION Left 08/12/2016   Procedure: LEFT BELOW KNEE AMPUTATION;  Surgeon: Newt Minion, MD;  Location: [facility];  Service: Orthopedics;  Laterality: Left;  . AMPUTATION Right 08/17/2016   Procedure: RIGHT BELOW KNEE AMPUTATION;  Surgeon: Elam Dutch, MD;  Location: [facility];  Service: Vascular;  Laterality: Right;  . AMPUTATION Right 09/12/2016   Procedure: AMPUTATION ABOVE KNEE;  Surgeon: Newt Minion, MD;  Location: [facility];  Service: Orthopedics;  Laterality: Right;  . APPLICATION OF WOUND VAC Right 09/12/2016   Procedure: APPLICATION OF WOUND VAC ABOVE KNEE;  Surgeon: Newt Minion, MD;  Location: [facility];   Service: Orthopedics;  Laterality: Right;  . LOWER EXTREMITY ANGIOGRAPHY Bilateral 08/11/2016   Procedure: Lower Extremity Angiography;  Surgeon: Waynetta Sandy, MD;  Location: [facility];  Service: Cardiovascular;  Laterality: Bilateral;  . PERIPHERAL VASCULAR BALLOON ANGIOPLASTY Left 08/11/2016   Procedure: Peripheral Vascular Balloon Angioplasty;  Surgeon: Waynetta Sandy, MD;  Location: [facility];  Service: Cardiovascular;  Laterality: Left;  SFA  . THYROID SURGERY      Social History   Social History  . Marital status: Single    Spouse name: N/A  . Number of children: N/A  . Years of education: N/A   Occupational History  . drives a forklift    Social History Main Topics  . Smoking status: Current Every Day Smoker    Packs/day: 0.75    Years: 45.00    Types: Cigarettes    Last attempt to quit: 11/12/2014  . Smokeless tobacco: Never Used  . Alcohol use No     Comment: h/o heavy use; stopped drinking in 7/16  . Drug use: No  . Sexual activity: Not Currently   Other Topics Concern  . Not on file   Social History Narrative  . No narrative on file     Vitals:   10/06/16 1139 10/06/16 1140  BP: 120/64 122/70  Pulse: 67 67  SpO2: 99% 99%    Wt Readings from Last 3 Encounters:  09/21/16 128 lb (58.1 kg)  09/20/16 128 lb (58.1 kg)  09/19/16 128 lb (58.1 kg)     PHYSICAL EXAM General: NAD HEENT: Normal. Neck: No JVD, no thyromegaly. Lungs: No crackles or wheezes. CV: Nondisplaced PMI.  Regular rate and rhythm, normal S1/S2, no S3/S4, no murmur.  Abdomen: Soft, nontender, no distention.  Neurologic: Alert and oriented.  Psych: Normal affect. Skin: Normal. Musculoskeletal: Right AKA, left BKA.    ECG: Most recent ECG reviewed.   Labs: Lab Results  Component Value Date/Time   K 3.4 (L) 09/16/2016 05:00 AM   BUN 8 09/16/2016 05:00 AM   BUN 30 (H) 08/03/2016 11:19 AM   CREATININE 0.95 09/16/2016 05:00 AM   ALT 42  09/14/2016 05:50 AM   TSH 1.726 08/08/2016 09:04 PM   TSH 6.460 (H) 05/24/2016 08:03 AM   HGB 7.9 (L) 09/16/2016 05:00 AM     Lipids: Lab Results  Component Value Date/Time   LDLCALC 103 (H) 05/24/2016 08:03 AM   CHOL 183 05/24/2016 08:03 AM   TRIG 195 (H) 05/24/2016 08:03 AM   HDL 41 05/24/2016 08:03 AM       ASSESSMENT AND PLAN: 1. Presumed CAD with cardiomyopathy: Symptomatically stable. LVEF 45-50%. Continue ASA, Coreg, and statin. Given his chronic kidney disease stage III, coronary angiography would be unfavorable. I do not feel it is indicated at this time. I will not pursue an ischemic evaluation (stress test)  given symptom stability.  2. Cardiomyopathy: LVEF 45-50%. No evidence of heart failure. No diuretic requirement at this time. Continue carvedilol.  3. Hypertension: Controlled. No changes.  4. Hyperlipidemia: Continue Lipitor 40 mg.  Disposition: Follow up 6 months.  Time spent: 40 minutes, of which greater than 50% was spent reviewing symptoms, relevant blood tests and studies, and discussing management plan with the patient.   Kate Sable, M.D., F.A.C.C.

## 2016-10-06 NOTE — Patient Instructions (Signed)

## 2016-10-07 ENCOUNTER — Telehealth (INDEPENDENT_AMBULATORY_CARE_PROVIDER_SITE_OTHER): Payer: Self-pay | Admitting: Orthopedic Surgery

## 2016-10-07 LAB — HEPATIC FUNCTION PANEL
ALT: 22 U/L (ref 10–40)
AST: 21 U/L (ref 14–40)
Alkaline Phosphatase: 180 U/L — AB (ref 25–125)
BILIRUBIN DIRECT: 0.12 mg/dL (ref 0.01–0.4)
BILIRUBIN, TOTAL: 0.4 mg/dL

## 2016-10-07 NOTE — Telephone Encounter (Signed)
[name] called wanting to speak with someone about the patients prosthesis. CB # [phone]

## 2016-10-12 NOTE — Telephone Encounter (Signed)
[name] is the pt's sister and she is the HIPAA. I called and she wanted an update on the pt. I advised per the last office visit that we removed the stitches on the right above the knee amputation and he will wear a shrinker on this side. Pt will continue to have silvadene dressings applied daily and the shrinker to this side as well. A rx for biotech was given to the pt so that once the leg has completely healed that he can begin the casting process for his legs. Physical therapy will be the one to decide if and when the pt is safe and able to return home.  Next appt is 10/31/16 and the pt's sister will call back for an update.

## 2016-10-19 ENCOUNTER — Encounter: Payer: Self-pay | Admitting: Adult Health

## 2016-10-19 ENCOUNTER — Non-Acute Institutional Stay (SKILLED_NURSING_FACILITY): Payer: BLUE CROSS/BLUE SHIELD | Admitting: Adult Health

## 2016-10-19 DIAGNOSIS — Z794 Long term (current) use of insulin: Secondary | ICD-10-CM

## 2016-10-19 DIAGNOSIS — E43 Unspecified severe protein-calorie malnutrition: Secondary | ICD-10-CM | POA: Diagnosis not present

## 2016-10-19 DIAGNOSIS — D72829 Elevated white blood cell count, unspecified: Secondary | ICD-10-CM

## 2016-10-19 DIAGNOSIS — I739 Peripheral vascular disease, unspecified: Secondary | ICD-10-CM | POA: Diagnosis not present

## 2016-10-19 DIAGNOSIS — I5022 Chronic systolic (congestive) heart failure: Secondary | ICD-10-CM | POA: Diagnosis not present

## 2016-10-19 DIAGNOSIS — R531 Weakness: Secondary | ICD-10-CM | POA: Diagnosis not present

## 2016-10-19 DIAGNOSIS — E1140 Type 2 diabetes mellitus with diabetic neuropathy, unspecified: Secondary | ICD-10-CM

## 2016-10-19 DIAGNOSIS — E785 Hyperlipidemia, unspecified: Secondary | ICD-10-CM

## 2016-10-19 DIAGNOSIS — N400 Enlarged prostate without lower urinary tract symptoms: Secondary | ICD-10-CM

## 2016-10-19 DIAGNOSIS — I10 Essential (primary) hypertension: Secondary | ICD-10-CM

## 2016-10-19 DIAGNOSIS — K5901 Slow transit constipation: Secondary | ICD-10-CM

## 2016-10-19 DIAGNOSIS — D649 Anemia, unspecified: Secondary | ICD-10-CM

## 2016-10-19 DIAGNOSIS — E039 Hypothyroidism, unspecified: Secondary | ICD-10-CM | POA: Diagnosis not present

## 2016-10-19 DIAGNOSIS — M1A9XX0 Chronic gout, unspecified, without tophus (tophi): Secondary | ICD-10-CM

## 2016-10-19 DIAGNOSIS — I2510 Atherosclerotic heart disease of native coronary artery without angina pectoris: Secondary | ICD-10-CM

## 2016-10-19 DIAGNOSIS — L89153 Pressure ulcer of sacral region, stage 3: Secondary | ICD-10-CM | POA: Diagnosis not present

## 2016-10-19 NOTE — Progress Notes (Addendum)
DATE:    10/19/2016   MRN:  [id number]  BIRTHDAY: Nov 08, 1955  Facility:  Nursing Home Location:  [place] Room Number: 188-C  LEVEL OF CARE:  SNF (31)  Contact Information    Name Relation Home Work [place] Mother [phone]     Cox,Dean Sister   [phone]   Bonnielee Haff [phone]  [phone]   Daleen Snook  [phone]     [place] Sister   [phone]       Code Status History    Date Active Date Inactive Code Status Order ID Comments User Context   09/08/2016  3:08 PM 09/16/2016  6:16 PM Full Code [id number]  Elwin Mocha, MD ED   08/30/2016  9:30 PM 09/04/2016  6:42 PM Full Code [id number]  Omar Person, NP Inpatient   08/08/2016  8:56 PM 08/19/2016  7:38 PM Full Code [id number]  Karmen Bongo, MD Inpatient   07/04/2016  2:04 AM 07/06/2016  6:41 PM Full Code [id number]  Phillips Grout, MD Inpatient   12/20/2015  3:40 PM 12/22/2015  9:52 PM Full Code [id number]  Samuella Cota, MD Inpatient       Chief Complaint  Patient presents with  . Discharge Note    HISTORY OF PRESENT ILLNESS:  This is a 59-YO male who is for discharge home with Home health PT, OT and ST and wound care.  He was seen in the room today and complained of constipation.  He was admitted to [facility] on 09/16/2016 for short-term rehabilitation following an admission at [facility] 09/08/2016-09/16/2016 for a right BKA infection S/P Right AKA on 09/12/16. He had a right BKA on 08/17/16 and left BKA on 08/12/16.  He has PMH of gout, hypertension and hyperlipidemia.  Patient was admitted to this facility for short-term rehabilitation after the patient's recent hospitalization.  Patient has completed SNF rehabilitation and therapy has cleared the patient for discharge.   PAST MEDICAL HISTORY:  Past Medical History:  Diagnosis Date  . AKI (acute kidney injury) ([place])   . Constipated   . Diabetes mellitus without  complication ([place])   . Diarrhea   . Elevated LFTs   . Goiter   . Gout   . Hyperlipidemia   . Hypertension   . Leukocytosis   . Reactive thrombocytosis   . Right BKA infection ([place]) 08/2016  . Right leg pain   . Sepsis due to undetermined organism ([place])   . Thyroid disease   . Wound infection after surgery 08/2016     CURRENT MEDICATIONS: Reviewed  Patient's Medications  New Prescriptions   No medications on file  Previous Medications   ACETAMINOPHEN (TYLENOL) 325 MG TABLET    Take 2 tablets (650 mg total) by mouth every 6 (six) hours as needed for mild pain (or Fever >/= 101).   AMINO ACIDS-PROTEIN HYDROLYS (FEEDING SUPPLEMENT, PRO-STAT SUGAR FREE 64,) LIQD    Take 30 mLs by mouth 3 (three) times daily with meals.   ASPIRIN EC 325 MG EC TABLET    Take 1 tablet (325 mg total) by mouth daily.   ATORVASTATIN (LIPITOR) 40 MG TABLET    Take 1 tablet (40 mg total) by mouth daily at 6 PM.   CARVEDILOL (COREG) 3.125 MG TABLET    Take 1 tablet (3.125 mg total) by mouth 2 (two) times daily with a meal.   COLCRYS 0.6 MG TABLET    Take 0.6 mg by  mouth 2 (two) times daily as needed.    DOXYCYCLINE (VIBRAMYCIN) 100 MG CAPSULE    Take 100 mg by mouth 2 (two) times daily.   INSULIN GLARGINE (LANTUS SOLOSTAR) 100 UNIT/ML SOLOSTAR PEN    Inject 10 Units into the skin daily.   LEVOTHYROXINE (SYNTHROID, LEVOTHROID) 100 MCG TABLET    Take 1 tablet (100 mcg total) by mouth daily before breakfast.   LOPERAMIDE (IMODIUM) 2 MG CAPSULE    Take 4 mg by mouth as needed for diarrhea or loose stools.   MULTIPLE VITAMINS-MINERALS (DECUBI-VITE PO)    Take 1 tablet by mouth daily.   NUTRITIONAL SUPPLEMENT LIQD    Take 120 mLs by mouth 2 (two) times daily between meals. [employer]   OXYCODONE-ACETAMINOPHEN (PERCOCET/ROXICET) 5-325 MG TABLET    Take 1 tablet by mouth every 4 (four) hours as needed for severe pain.   PREGABALIN (LYRICA) 100 MG CAPSULE    Take 100 mg by mouth 2 (two) times daily.   SACCHAROMYCES  BOULARDII (FLORASTOR) 250 MG CAPSULE    Take 250 mg by mouth 2 (two) times daily.   TAMSULOSIN (FLOMAX) 0.4 MG CAPS CAPSULE    Take 0.4 mg by mouth daily.   Modified Medications   No medications on file  Discontinued Medications   COLLAGENASE (SANTYL) OINTMENT    Apply topically daily.   PREGABALIN (LYRICA) 200 MG CAPSULE    Take 1 capsule (200 mg total) by mouth daily.     Allergies  Allergen Reactions  . Lisinopril Other (See Comments)    Hyperkalemia / Renal failure     REVIEW OF SYSTEMS:  GENERAL: no change in appetite, no fatigue, no weight changes, no fever, chills or weakness EYES: Denies change in vision, dry eyes, eye pain, itching or discharge EARS: Denies change in hearing, ringing in ears, or earache NOSE: Denies nasal congestion or epistaxis MOUTH and THROAT: Denies oral discomfort, gingival pain or bleeding, pain from teeth or hoarseness   RESPIRATORY: no cough, SOB, DOE, wheezing, hemoptysis CARDIAC: no chest pain, edema or palpitations GI: no abdominal pain, diarrhea, heart burn, nausea or vomiting, +constipation GU: Denies dysuria, frequency, hematuria, incontinence, or discharge PSYCHIATRIC: Denies feeling of depression or anxiety. No report of hallucinations, insomnia, paranoia, or agitation    PHYSICAL EXAMINATION  GENERAL APPEARANCE: Well nourished. In no acute distress. Normal body habitus SKIN:  Right AKA stump with dressing and shrinker, left stump covered with dressing and ACE wrap, has sacral pressure ulcer stage 3 HEAD: Normal in size and contour. No evidence of trauma EYES: Lids open and close normally. No blepharitis, entropion or ectropion. PERRL. Conjunctivae are clear and sclerae are white. Lenses are without opacity EARS: Pinnae are normal. Patient hears normal voice tunes of the examiner MOUTH and THROAT: Lips are without lesions. Oral mucosa is moist and without lesions. Tongue is normal in shape, size, and color and without lesions NECK:  supple, trachea midline, no neck masses, no thyroid tenderness, no thyromegaly LYMPHATICS: no LAN in the neck, no supraclavicular LAN RESPIRATORY: breathing is even & unlabored, BS CTAB CARDIAC: RRR, no murmur,no extra heart sounds, no edema GI: abdomen soft, normal BS, no masses, no tenderness, no hepatomegaly, no splenomegaly EXTREMITIES:  Able to move  X 4 extremities PSYCHIATRIC: Alert and oriented X 3. Affect and behavior are appropriate   LABS/RADIOLOGY: Labs reviewed: 10/10/16  BP WBC 12.4 hemoglobin 11.8 hematocrit 38.7 MCV 91.1 platelet 630 Basic Metabolic Panel:  Recent Labs  08/31/16 0131  09/01/16 0355  09/03/16 0622 09/04/16 0526  09/09/16 0418  09/14/16 0550 09/15/16 1055 09/16/16 0500 09/20/16 09/21/16  NA 135  < > 136  < > 134* 134*  < > 135  < > 136 136 138 137 139  K 6.1*  < > 4.2  < > 3.7 3.6  < > 4.0  < > 3.5 3.5 3.4* 4.7 4.2  CL 102  < > 98*  < > 101 98*  < > 108  < > 106 109 107  --   --   CO2 17*  < > 27  < > 21* 21*  < > 15*  < > 22 19* 20*  --   --   GLUCOSE 86  < > 101*  < > 101* 113*  < > 111*  < > 95 77 96  --   --   BUN 101*  < > 26*  < > 23* 25*  < > 29*  < > 9 6 8 12 16   CREATININE 4.99*  < > 2.14*  < > 1.55* 1.65*  < > 1.48*  < > 1.11 0.94 0.95 0.8 0.9  CALCIUM 8.6*  < > 7.8*  < > 8.1* 8.3*  < > 7.5*  < > 7.6* 7.4* 7.3*  --   --   MG 2.4  --  2.1  --   --   --   --  1.8  --   --   --   --   --   --   PHOS 7.4*  < > 3.5  < > 5.4* 5.9*  --  2.9  --   --   --   --   --   --   < > = values in this interval not displayed. Liver Function Tests:  Recent Labs  09/10/16 0345 09/13/16 0650 09/14/16 0550 09/20/16 09/21/16 10/07/16  AST 58* 37 70* 28 19 21   ALT 59 31 42 26 22 22   ALKPHOS 263* 188* 227* 219* 213* 180*  BILITOT 0.6 0.6 0.8  --   --   --   PROT 7.2 7.3 7.1  --   --   --   ALBUMIN 1.6* 1.6* 1.5*  --   --   --     Recent Labs  12/20/15 0934  LIPASE 69*   CBC:  Recent Labs  08/08/16 1617  09/08/16 1259  09/14/16 0550  09/15/16 1055 09/16/16 0500 09/20/16  WBC 22.7*  < > 25.1*  < > 18.1* 15.6* 14.2* 11.2  NEUTROABS 17.7*  --  20.2*  --   --   --   --  7  HGB 12.5*  < > 8.6*  < > 8.3* 8.4* 7.9* 9.1*  HCT 36.3*  < > 26.1*  < > 25.8* 26.6* 25.0* 29*  MCV 84.0  < > 80.3  < > 81.1 81.6 81.7  --   PLT 710*  < > 759*  < > 808* 813* 813* 719*  < > = values in this interval not displayed. Lipid Panel:  Recent Labs  05/24/16 0803  HDL 41   Cardiac Enzymes:  Recent Labs  08/30/16 2155 08/31/16 0131 08/31/16 0757  CKTOTAL  --  3,569*  --   TROPONINI 0.04* 0.03* 0.04*   CBG:  Recent Labs  09/15/16 2151 09/16/16 0638 09/16/16 1025  GLUCAP 108* 91 106*      ASSESSMENT/PLAN:  Generalized weakness - continue rehabilitation with  PT and OT, for therapeutic strengthening exercises;  fall precautions  Leukocytosis - secondary to bilateral  BKA wound infection, had right BKA on 08/17/16 which got infected and S/P right AKA on 09/12/16, wound vac has been discontinued, WBC trending down, follows-up with Dr. Sharol Given, orthopedic surgeon, repeat CBC; will continue Doxycycline till 10/21/16; will have Home health wound care  Chronic systolic heart failure -  no chest pains; weigh Q Mondays-Wednesdays-Fridays a  CAD - continue aspirin 325 mg EC 1 tab by mouth daily  Protein calorie malnutrition, severe - continue Prostat 30 ml daily for a total of 30 days  Gout - continue Colcrys PRN  PVD - S/P rightl AKA and left BKA,  follows-up with Dr. Sharol Given, orthopedic surgeon ; continue Lyrica 200 mg 1 capsule by mouth daily, Percocet 5/325 mg 1 tab by mouth every 4 hours when necessary and Tylenol 2 tabs = 650 mg by mouth every 6 hours when necessary for pain    Essential hypertension - continue Coreg 3.125 mg 1 tab by mouth twice a day   Diabetes mellitus, type II - continue Lantus 100 units/mL inject 10 units subcutaneous daily Lab Results  Component Value Date   HGBA1C 6.8 (H) 08/08/2016   BPH - continue Flomax  0.4 mg 1 capsule by mouth daily  Sacral pressure ulcer, stage 3 - will have Home health wound care,  keep skin clean and dry  Hyperlipidemia - continue atorvastatin 40 mg 1 tab by mouth daily  Hypothyroidism - continue levothyroxine 100 g 1 tab by mouth daily Lab Results  Component Value Date   TSH 1.726 08/08/2016   Anemia of chronic disease - hgb 11.8; stable  Constipation - start Senna-S 2 tabs PO BID X 3 days and Miralax 17 gm PO BID X 3 days     I have filled out patient's discharge paperwork and written prescriptions.  Patient will receive home health PT, OT and ST.  DME provided:  Bedside commode and long sliding board  Total discharge time: Greater than 30 minutes Greater than 50% was spent in counseling and coordination of care with the patient.  Discharge time involved coordination of the discharge process with social worker, nursing staff and therapy department. Medical justification for home health services/DME verified.   Monina C. Windsor - NP    [employer] [phone]

## 2016-10-27 ENCOUNTER — Telehealth: Payer: Self-pay | Admitting: Family Medicine

## 2016-10-27 NOTE — Telephone Encounter (Signed)
Please  write and I will sign. Thanks, WS 

## 2016-10-27 NOTE — Telephone Encounter (Signed)
Rx written and placed on Dr Livia Snellen desk to be signed tomorrow morning when he returns to the office.

## 2016-10-31 ENCOUNTER — Encounter (INDEPENDENT_AMBULATORY_CARE_PROVIDER_SITE_OTHER): Payer: Self-pay | Admitting: Orthopedic Surgery

## 2016-10-31 ENCOUNTER — Ambulatory Visit (INDEPENDENT_AMBULATORY_CARE_PROVIDER_SITE_OTHER): Payer: BLUE CROSS/BLUE SHIELD | Admitting: Orthopedic Surgery

## 2016-10-31 VITALS — Ht 68.0 in | Wt 128.0 lb

## 2016-10-31 DIAGNOSIS — Z89611 Acquired absence of right leg above knee: Secondary | ICD-10-CM

## 2016-10-31 DIAGNOSIS — Z89512 Acquired absence of left leg below knee: Secondary | ICD-10-CM

## 2016-10-31 NOTE — Progress Notes (Signed)
Office Visit Note   Patient: Todd [name]           Date of Birth: 1955-12-01           MRN: [id number] Visit Date: 10/31/2016              Requested by: [name], MD [place], [place] [id number] PCP: [name], MD  Chief Complaint  Patient presents with  . Right Leg - Routine Post Op    08/16/98 R AKA application of wound vac 49 days post op  . Left Leg - Follow-up    08/12/16 Lt BKA      HPI: Patient is status post right above-knee amputation and left below the knee amputation. He is in a Financial planner on the right and a compression stocking on the left.  Assessment & Plan: Visit Diagnoses:  1. History of right above knee amputation ([place])   2. Acquired absence of left leg below knee [facility])     Plan: Patient has no complaints he will follow-up with prior tach for his prosthetic fitting's. Patient will be a good K2 level ambulator.  Follow-Up Instructions: Return in about 4 weeks (around 11/28/2016).   Ortho Exam  Patient is alert, oriented, no adenopathy, well-dressed, normal affect, normal respiratory effort. Examination patient has excellent consolidation of the right above-knee amputation and left below-knee amputation there is no open wounds no drainage no cellulitis there is good consolidation for both residual limbs.  Imaging: No results found.  Labs: Lab Results  Component Value Date   HGBA1C 6.8 (H) 08/08/2016   HGBA1C 14.3 (H) 12/21/2015   ESRSEDRATE 138 (H) 09/08/2016   ESRSEDRATE 117 (H) 08/08/2016   CRP 7.1 (H) 09/08/2016   CRP 3.2 (H) 08/08/2016   LABURIC 9.2 (H) 07/11/2016   LABURIC 11.4 (H) 07/04/2016   LABURIC 10.0 (H) 06/21/2016   REPTSTATUS 09/13/2016 FINAL 09/08/2016   REPTSTATUS 09/13/2016 FINAL 09/08/2016   CULT NO GROWTH 5 DAYS 09/08/2016   CULT NO GROWTH 5 DAYS 09/08/2016    Orders:  No orders of the defined types were placed in this encounter.  No orders of the defined types were placed in this encounter.    Procedures: No procedures performed  Clinical Data: No additional findings.  ROS:  All other systems negative, except as noted in the HPI. Review of Systems  Objective: Vital Signs: Ht 5\' 8"  (1.727 m)   Wt 128 lb (58.1 kg)   BMI 19.46 kg/m   Specialty Comments:  No specialty comments available.  PMFS History: Patient Active Problem List   Diagnosis Date Noted  . History of right above knee amputation ([place]) 09/21/2016  . Acquired absence of left leg below knee ([place]) 09/21/2016  . Diarrhea   . Transaminitis   . Respiratory failure ([place])   . Acute on chronic kidney failure ([place]) 08/30/2016  . Diabetic peripheral neuropathy ([place]) 07/21/2016  . Renal failure 07/04/2016  . Gout 07/04/2016  . Elevated troponin 07/04/2016  . Hyperuricemia 06/21/2016  . HTN (hypertension) 01/05/2016  . Diabetes type 2, uncontrolled ([place]) 01/05/2016  . AKI (acute kidney injury) ([place]) 12/20/2015  . Hypothyroidism 05/10/2011   Past Medical History:  Diagnosis Date  . AKI (acute kidney injury) ([place])   . Constipated   . Diabetes mellitus without complication ([place])   . Diarrhea   . Elevated LFTs   . Goiter   . Gout   . Hyperlipidemia   . Hypertension   . Leukocytosis   .  Reactive thrombocytosis   . Right BKA infection ([place]) 08/2016  . Right leg pain   . Sepsis due to undetermined organism ([place])   . Thyroid disease   . Wound infection after surgery 08/2016    Family History  Problem Relation Age of Onset  . Heart disease Mother   . Pneumonia Father   . Diabetes Maternal Aunt   . Diabetes Maternal Uncle     Past Surgical History:  Procedure Laterality Date  . ABDOMINAL AORTOGRAM N/A 08/11/2016   Procedure: Abdominal Aortogram;  Surgeon: Waynetta Sandy, MD;  Location: [facility];  Service: Cardiovascular;  Laterality: N/A;  . ABDOMINAL AORTOGRAM W/LOWER EXTREMITY N/A 08/15/2016   Procedure: Abdominal Aortogram w/Lower Extremity;  Surgeon: Elam Dutch, MD;   Location: [facility];  Service: Cardiovascular;  Laterality: N/A;  . AMPUTATION Left 08/12/2016   Procedure: LEFT BELOW KNEE AMPUTATION;  Surgeon: Newt Minion, MD;  Location: [facility];  Service: Orthopedics;  Laterality: Left;  . AMPUTATION Right 08/17/2016   Procedure: RIGHT BELOW KNEE AMPUTATION;  Surgeon: Elam Dutch, MD;  Location: [facility];  Service: Vascular;  Laterality: Right;  . AMPUTATION Right 09/12/2016   Procedure: AMPUTATION ABOVE KNEE;  Surgeon: Newt Minion, MD;  Location: [facility];  Service: Orthopedics;  Laterality: Right;  . APPLICATION OF WOUND VAC Right 09/12/2016   Procedure: APPLICATION OF WOUND VAC ABOVE KNEE;  Surgeon: Newt Minion, MD;  Location: [facility];  Service: Orthopedics;  Laterality: Right;  . LOWER EXTREMITY ANGIOGRAPHY Bilateral 08/11/2016   Procedure: Lower Extremity Angiography;  Surgeon: Waynetta Sandy, MD;  Location: [facility];  Service: Cardiovascular;  Laterality: Bilateral;  . PERIPHERAL VASCULAR BALLOON ANGIOPLASTY Left 08/11/2016   Procedure: Peripheral Vascular Balloon Angioplasty;  Surgeon: Waynetta Sandy, MD;  Location: [facility];  Service: Cardiovascular;  Laterality: Left;  SFA  . THYROID SURGERY     Social History   Occupational History  . drives a forklift    Social History Main Topics  . Smoking status: Current Every Day Smoker    Packs/day: 0.75    Years: 45.00    Types: Cigarettes    Last attempt to quit: 11/12/2014  . Smokeless tobacco: Never Used  . Alcohol use No     Comment: h/o heavy use; stopped drinking in 7/16  . Drug use: No  . Sexual activity: Not Currently

## 2016-11-03 ENCOUNTER — Telehealth (INDEPENDENT_AMBULATORY_CARE_PROVIDER_SITE_OTHER): Payer: Self-pay

## 2016-11-03 NOTE — Telephone Encounter (Signed)
I called and spoke with Maudie Mercury to give verbal for home health nursing for patient and social worker.

## 2016-11-03 NOTE — Telephone Encounter (Signed)
Kim with [facility] would like orders for patient to have [place].  CB# is [phone].

## 2016-11-08 ENCOUNTER — Telehealth (INDEPENDENT_AMBULATORY_CARE_PROVIDER_SITE_OTHER): Payer: Self-pay

## 2016-11-08 NOTE — Telephone Encounter (Signed)
Faxed last office visit to [phone]

## 2016-11-08 NOTE — Telephone Encounter (Signed)
Angie with [facility] would like patient's last office note faxed to [phone].  CB# is [phone].  Thank You.

## 2016-11-10 ENCOUNTER — Telehealth (INDEPENDENT_AMBULATORY_CARE_PROVIDER_SITE_OTHER): Payer: Self-pay | Admitting: Orthopedic Surgery

## 2016-11-10 NOTE — Telephone Encounter (Signed)
Todd [name] called from [facility] called and was wanting to speak with you about getting an electric bed so it would be easier for him to get in and out of. CB # [phone]

## 2016-11-11 ENCOUNTER — Encounter: Payer: Self-pay | Admitting: Family Medicine

## 2016-11-11 ENCOUNTER — Other Ambulatory Visit (INDEPENDENT_AMBULATORY_CARE_PROVIDER_SITE_OTHER): Payer: Self-pay

## 2016-11-11 ENCOUNTER — Ambulatory Visit (INDEPENDENT_AMBULATORY_CARE_PROVIDER_SITE_OTHER): Payer: BLUE CROSS/BLUE SHIELD | Admitting: Family Medicine

## 2016-11-11 ENCOUNTER — Telehealth (INDEPENDENT_AMBULATORY_CARE_PROVIDER_SITE_OTHER): Payer: Self-pay

## 2016-11-11 VITALS — BP 111/61 | HR 73 | Temp 97.0°F | Ht 68.0 in

## 2016-11-11 DIAGNOSIS — Z89611 Acquired absence of right leg above knee: Secondary | ICD-10-CM

## 2016-11-11 DIAGNOSIS — E1165 Type 2 diabetes mellitus with hyperglycemia: Secondary | ICD-10-CM

## 2016-11-11 DIAGNOSIS — I10 Essential (primary) hypertension: Secondary | ICD-10-CM | POA: Diagnosis not present

## 2016-11-11 DIAGNOSIS — E1121 Type 2 diabetes mellitus with diabetic nephropathy: Secondary | ICD-10-CM | POA: Diagnosis not present

## 2016-11-11 DIAGNOSIS — E039 Hypothyroidism, unspecified: Secondary | ICD-10-CM

## 2016-11-11 DIAGNOSIS — Z89512 Acquired absence of left leg below knee: Secondary | ICD-10-CM | POA: Diagnosis not present

## 2016-11-11 DIAGNOSIS — Z794 Long term (current) use of insulin: Secondary | ICD-10-CM

## 2016-11-11 DIAGNOSIS — E790 Hyperuricemia without signs of inflammatory arthritis and tophaceous disease: Secondary | ICD-10-CM

## 2016-11-11 DIAGNOSIS — R740 Nonspecific elevation of levels of transaminase and lactic acid dehydrogenase [LDH]: Secondary | ICD-10-CM | POA: Diagnosis not present

## 2016-11-11 DIAGNOSIS — IMO0002 Reserved for concepts with insufficient information to code with codable children: Secondary | ICD-10-CM

## 2016-11-11 DIAGNOSIS — R7401 Elevation of levels of liver transaminase levels: Secondary | ICD-10-CM

## 2016-11-11 LAB — BAYER DCA HB A1C WAIVED: HB A1C (BAYER DCA - WAIVED): 5.3 % (ref <7.0)

## 2016-11-11 NOTE — Telephone Encounter (Signed)
Helene Kelp with [facility] calling again concerning an electric bed for patient.  Stated that she may be reached today at [phone], starting next week you will have to call [phone].  Gave verbal orders of PT for 2 x week for 6  Weeks.

## 2016-11-11 NOTE — Addendum Note (Signed)
Addendum  created 11/11/16 1315 by Rica Koyanagi, MD   Sign clinical note

## 2016-11-11 NOTE — Telephone Encounter (Signed)
Called and sw Clarene Critchley to advise that order can be written. This was faxed to [place] [facility] supply [phone]. I sent this with demo information for the pt and to contact me if anything else is needed.

## 2016-11-11 NOTE — Progress Notes (Signed)
Subjective:  Patient ID: Todd [name], male    DOB: [birth year]  Age: 61 y.o. MRN: [id number]  CC: Follow-up (pt here today for follow up after being discharged from a [facility] in [place] for double amputation of his legs.)   HPI Todd [name] presents for Follow-up of diabetes. Patient does not check blood sugar at home Patient denies symptoms such as polyuria, polydipsia, excessive hunger, nausea No significant hypoglycemic spells noted. Medications as noted below. Taking them regularly without complication/adverse reaction being reported today.   Patient recently underwent double amputation due to poor circulation caused by his diabetes. He has a right above the knee and left below the knee amputation. He is in need of help at home because he's having difficulty making transfers he has a board but his elderly mother is having to hold it in place so he can slide from the board to his wheelchair.He can not preform several of the functions involved in ADLs - toileting, bathing and meal preparation.+  Patient presents for follow-up on  thyroid. The patient has a history of hypothyroidism for many years. It has been stable recently. Pt. denies any change in  voice, loss of hair, heat or cold intolerance. Energy level has been adequate to good. Patient denies constipation and diarrhea. No myxedema. Medication is as noted below. Verified that pt is taking it daily on an empty stomach. Well tolerated.  History of hyperuricemia however no recent gout attacks reported.   History Todd [name] has a past medical history of AKI (acute kidney injury) ([place]); Constipated; Diabetes mellitus without complication ([place]); Diarrhea; Elevated LFTs; Goiter; Gout; Hyperlipidemia; Hypertension; Leukocytosis; Reactive thrombocytosis; Right BKA infection ([place]) (08/2016); Right leg pain; Sepsis due to undetermined organism ([place]); Thyroid disease; and Wound infection after surgery (08/2016).   He has a  past surgical history that includes Thyroid surgery; Lower Extremity Angiography (Bilateral, 08/11/2016); Abdominal Aortogram (N/A, 08/11/2016); Peripheral Vascular Balloon Angioplasty (Left, 08/11/2016); Amputation (Left, 08/12/2016); Abdominal Aortogram w/Lower Extremity (N/A, 08/15/2016); Amputation (Right, 08/17/2016); Amputation (Right, 02/18/3531); and Application if wound vac (Right, 09/12/2016).   His family history includes Diabetes in his maternal aunt and maternal uncle; Heart disease in his mother; Pneumonia in his father.He reports that he has been smoking Cigarettes.  He has a 33.75 pack-year smoking history. He has never used smokeless tobacco. He reports that he does not drink alcohol or use drugs.    ROS Review of Systems  Constitutional: Negative for chills, diaphoresis and fever.  HENT: Negative for rhinorrhea and sore throat.   Respiratory: Negative for cough and shortness of breath.   Cardiovascular: Negative for chest pain.  Gastrointestinal: Negative for abdominal pain.  Musculoskeletal: Positive for gait problem. Negative for arthralgias and myalgias.  Skin: Negative for rash.  Neurological: Negative for weakness and headaches.    Objective:  BP 111/61   Pulse 73   Temp 97 F (36.1 C) (Oral)   Ht '5\' 8"'$  (1.727 m)   BP Readings from Last 3 Encounters:  11/11/16 111/61  10/19/16 114/69  10/06/16 122/70    Wt Readings from Last 3 Encounters:  10/31/16 128 lb (58.1 kg)  10/19/16 128 lb (58.1 kg)  09/21/16 128 lb (58.1 kg)     Physical Exam  Constitutional: He is oriented to person, place, and time. He appears well-developed and well-nourished. No distress.  HENT:  Head: Normocephalic and atraumatic.  Right Ear: External ear normal.  Left Ear: External ear normal.  Nose: Nose normal.  Mouth/Throat: Oropharynx is clear and  moist.  Eyes: Conjunctivae and EOM are normal. Pupils are equal, round, and reactive to light.  Neck: Normal range of motion. Neck supple. No  thyromegaly present.  Cardiovascular: Normal rate, regular rhythm and normal heart sounds.   No murmur heard. Pulmonary/Chest: Effort normal and breath sounds normal. No respiratory distress. He has no wheezes. He has no rales.  Abdominal: Soft. Bowel sounds are normal. He exhibits no distension. There is no tenderness.  Musculoskeletal: Normal range of motion.  Normal ROM other than as related to BLE amputation - WC bound  Lymphadenopathy:    He has no cervical adenopathy.  Neurological: He is alert and oriented to person, place, and time. He has normal reflexes.  Skin: Skin is warm and dry.  Psychiatric: He has a normal mood and affect. His behavior is normal. Thought content normal.      Assessment & Plan:   Todd [name] was seen today for follow-up.  Diagnoses and all orders for this visit:  Uncontrolled type 2 diabetes mellitus with diabetic nephropathy, with long-term current use of insulin (HCC) -     CBC with Differential/Platelet -     Microalbumin / creatinine urine ratio -     Bayer DCA Hb A1c Waived -     Lipid panel -     CMP14+EGFR -     Face-to-face encounter (required for Medicare/Medicaid patients)  Hypothyroidism, unspecified type -     TSH + free T4  Essential hypertension  Hyperuricemia -     Uric acid  Transaminitis  History of right above knee amputation ([place]) -     Face-to-face encounter (required for Medicare/Medicaid patients)  Acquired absence of left leg below knee ([place]) -     Face-to-face encounter (required for Medicare/Medicaid patients)       I have discontinued Todd [name] tamsulosin, oxyCODONE-acetaminophen, NUTRITIONAL SUPPLEMENT, pregabalin, and Multiple Vitamins-Minerals (DECUBI-VITE PO). I am also having him maintain his carvedilol, COLCRYS, Insulin Glargine, aspirin, acetaminophen, loperamide, atorvastatin, and levothyroxine.  Allergies as of 11/11/2016      Reactions   Lisinopril Other (See Comments)   Hyperkalemia / Renal  failure      Medication List       Accurate as of 11/11/16 11:59 PM. Always use your most recent med list.          acetaminophen 325 MG tablet Commonly known as:  TYLENOL Take 2 tablets (650 mg total) by mouth every 6 (six) hours as needed for mild pain (or Fever >/= 101).   aspirin 325 MG EC tablet Take 1 tablet (325 mg total) by mouth daily.   atorvastatin 40 MG tablet Commonly known as:  LIPITOR Take 1 tablet (40 mg total) by mouth daily at 6 PM.   carvedilol 3.125 MG tablet Commonly known as:  COREG Take 1 tablet (3.125 mg total) by mouth 2 (two) times daily with a meal.   COLCRYS 0.6 MG tablet Generic drug:  colchicine Take 0.6 mg by mouth 2 (two) times daily as needed.   Insulin Glargine 100 UNIT/ML Solostar Pen Commonly known as:  LANTUS SOLOSTAR Inject 10 Units into the skin daily.   levothyroxine 125 MCG tablet Commonly known as:  SYNTHROID, LEVOTHROID Take 125 mcg by mouth daily before breakfast.   loperamide 2 MG capsule Commonly known as:  IMODIUM Take 4 mg by mouth as needed for diarrhea or loose stools.        Follow-up: Return in about 3 months (around 02/11/2017) for diabetes, hypertension, cholesterol.  Todd [name]  Todd [name], M.D.

## 2016-11-12 LAB — CMP14+EGFR
A/G RATIO: 1 — AB (ref 1.2–2.2)
ALK PHOS: 128 IU/L — AB (ref 39–117)
ALT: 23 IU/L (ref 0–44)
AST: 26 IU/L (ref 0–40)
Albumin: 4.3 g/dL (ref 3.6–4.8)
BUN/Creatinine Ratio: 10 (ref 10–24)
BUN: 9 mg/dL (ref 8–27)
Bilirubin Total: 0.3 mg/dL (ref 0.0–1.2)
CALCIUM: 10.6 mg/dL — AB (ref 8.6–10.2)
CO2: 19 mmol/L (ref 18–29)
Chloride: 100 mmol/L (ref 96–106)
Creatinine, Ser: 0.91 mg/dL (ref 0.76–1.27)
GFR calc Af Amer: 105 mL/min/{1.73_m2} (ref >59)
GFR, EST NON AFRICAN AMERICAN: 91 mL/min/{1.73_m2} (ref >59)
Globulin, Total: 4.5 g/dL (ref 1.5–4.5)
Glucose: 83 mg/dL (ref 65–99)
POTASSIUM: 4.2 mmol/L (ref 3.5–5.2)
SODIUM: 139 mmol/L (ref 134–144)
Total Protein: 8.8 g/dL — ABNORMAL HIGH (ref 6.0–8.5)

## 2016-11-12 LAB — CBC WITH DIFFERENTIAL/PLATELET
BASOS: 1 %
Basophils Absolute: 0 10*3/uL (ref 0.0–0.2)
EOS (ABSOLUTE): 0.2 10*3/uL (ref 0.0–0.4)
EOS: 2 %
Hematocrit: 42.2 % (ref 37.5–51.0)
Hemoglobin: 14 g/dL (ref 13.0–17.7)
IMMATURE GRANS (ABS): 0 10*3/uL (ref 0.0–0.1)
IMMATURE GRANULOCYTES: 0 %
LYMPHS: 41 %
Lymphocytes Absolute: 3.2 10*3/uL — ABNORMAL HIGH (ref 0.7–3.1)
MCH: 27.2 pg (ref 26.6–33.0)
MCHC: 33.2 g/dL (ref 31.5–35.7)
MCV: 82 fL (ref 79–97)
Monocytes Absolute: 0.3 10*3/uL (ref 0.1–0.9)
Monocytes: 4 %
NEUTROS PCT: 52 %
Neutrophils Absolute: 4.2 10*3/uL (ref 1.4–7.0)
PLATELETS: 635 10*3/uL — AB (ref 150–379)
RBC: 5.14 x10E6/uL (ref 4.14–5.80)
RDW: 15.9 % — ABNORMAL HIGH (ref 12.3–15.4)
WBC: 7.9 10*3/uL (ref 3.4–10.8)

## 2016-11-12 LAB — TSH+FREE T4
FREE T4: 2.37 ng/dL — AB (ref 0.82–1.77)
TSH: 0.153 u[IU]/mL — AB (ref 0.450–4.500)

## 2016-11-12 LAB — LIPID PANEL
CHOL/HDL RATIO: 3.5 ratio (ref 0.0–5.0)
Cholesterol, Total: 109 mg/dL (ref 100–199)
HDL: 31 mg/dL — AB (ref >39)
LDL CALC: 47 mg/dL (ref 0–99)
Triglycerides: 155 mg/dL — ABNORMAL HIGH (ref 0–149)
VLDL Cholesterol Cal: 31 mg/dL (ref 5–40)

## 2016-11-12 LAB — URIC ACID: URIC ACID: 9.5 mg/dL — AB (ref 3.7–8.6)

## 2016-11-13 ENCOUNTER — Other Ambulatory Visit: Payer: Self-pay | Admitting: Family Medicine

## 2016-11-13 MED ORDER — LEVOTHYROXINE SODIUM 100 MCG PO TABS: 100.0000 ug | ORAL_TABLET | Freq: Every day | ORAL | 2 refills | Status: DC

## 2016-11-13 MED ORDER — ALLOPURINOL 100 MG PO TABS: 100.0000 mg | ORAL_TABLET | Freq: Every day | ORAL | 6 refills | Status: DC

## 2016-11-14 ENCOUNTER — Encounter (HOSPITAL_COMMUNITY): Payer: Self-pay | Admitting: Orthopedic Surgery

## 2016-11-14 NOTE — Addendum Note (Signed)
Addendum  created 11/14/16 1155 by Oleta Mouse, MD   Sign clinical note

## 2016-11-28 ENCOUNTER — Ambulatory Visit (INDEPENDENT_AMBULATORY_CARE_PROVIDER_SITE_OTHER): Payer: BLUE CROSS/BLUE SHIELD | Admitting: Orthopedic Surgery

## 2016-11-28 ENCOUNTER — Encounter (INDEPENDENT_AMBULATORY_CARE_PROVIDER_SITE_OTHER): Payer: Self-pay | Admitting: Orthopedic Surgery

## 2016-11-28 VITALS — Ht 68.0 in | Wt 128.0 lb

## 2016-11-28 DIAGNOSIS — Z89512 Acquired absence of left leg below knee: Secondary | ICD-10-CM

## 2016-11-28 DIAGNOSIS — Z89611 Acquired absence of right leg above knee: Secondary | ICD-10-CM

## 2016-11-28 NOTE — Progress Notes (Signed)
Office Visit Note   Patient: Todd [name]           Date of Birth: 10/02/1955           MRN: [id number] Visit Date: 11/28/2016              Requested by: [name], MD [place], [place] [id number] PCP: [name], MD  Chief Complaint  Patient presents with  . Follow-up    Right AKA on 09/12/16      HPI: Patient is about 2 and half months status post right above-the-knee amputation. Patient has been fitted for his prosthesis several weeks ago and will start gait training this week.  Assessment & Plan: Visit Diagnoses:  1. History of right above knee amputation ([place])   2. Acquired absence of left leg below knee [facility])     Plan: Begin gait training with new prosthesis follow-up with [facility]. Follow-up the office in 3 months  Follow-Up Instructions: Return in about 3 months (around 02/28/2017).   Ortho Exam  Patient is alert, oriented, no adenopathy, well-dressed, normal affect, normal respiratory effort. Examination patient has well healed and consolidated left above-the-knee amputation and right above-knee amputation. There is no redness no cellulitis no signs of infection.  Imaging: No results found.  Labs: Lab Results  Component Value Date   HGBA1C 6.8 (H) 08/08/2016   HGBA1C 14.3 (H) 12/21/2015   ESRSEDRATE 138 (H) 09/08/2016   ESRSEDRATE 117 (H) 08/08/2016   CRP 7.1 (H) 09/08/2016   CRP 3.2 (H) 08/08/2016   LABURIC 9.5 (H) 11/11/2016   LABURIC 9.2 (H) 07/11/2016   LABURIC 11.4 (H) 07/04/2016   REPTSTATUS 09/13/2016 FINAL 09/08/2016   REPTSTATUS 09/13/2016 FINAL 09/08/2016   CULT NO GROWTH 5 DAYS 09/08/2016   CULT NO GROWTH 5 DAYS 09/08/2016    Orders:  No orders of the defined types were placed in this encounter.  No orders of the defined types were placed in this encounter.    Procedures: No procedures performed  Clinical Data: No additional findings.  ROS:  All other systems negative, except as noted in the HPI. Review  of Systems  Objective: Vital Signs: Ht 5\' 8"  (1.727 m)   Wt 128 lb (58.1 kg)   BMI 19.46 kg/m   Specialty Comments:  No specialty comments available.  PMFS History: Patient Active Problem List   Diagnosis Date Noted  . History of right above knee amputation ([place]) 09/21/2016  . Acquired absence of left leg below knee ([place]) 09/21/2016  . Transaminitis   . Diabetic peripheral neuropathy ([place]) 07/21/2016  . Renal failure 07/04/2016  . Gout 07/04/2016  . Hyperuricemia 06/21/2016  . HTN (hypertension) 01/05/2016  . Diabetes type 2, uncontrolled ([place]) 01/05/2016  . Hypothyroidism 05/10/2011   Past Medical History:  Diagnosis Date  . AKI (acute kidney injury) ([place])   . Constipated   . Diabetes mellitus without complication ([place])   . Diarrhea   . Elevated LFTs   . Goiter   . Gout   . Hyperlipidemia   . Hypertension   . Leukocytosis   . Reactive thrombocytosis   . Right BKA infection ([place]) 08/2016  . Right leg pain   . Sepsis due to undetermined organism ([place])   . Thyroid disease   . Wound infection after surgery 08/2016    Family History  Problem Relation Age of Onset  . Heart disease Mother   . Pneumonia Father   . Diabetes Maternal Aunt   . Diabetes  Maternal Uncle     Past Surgical History:  Procedure Laterality Date  . ABDOMINAL AORTOGRAM N/A 08/11/2016   Procedure: Abdominal Aortogram;  Surgeon: Waynetta Sandy, MD;  Location: [facility];  Service: Cardiovascular;  Laterality: N/A;  . ABDOMINAL AORTOGRAM W/LOWER EXTREMITY N/A 08/15/2016   Procedure: Abdominal Aortogram w/Lower Extremity;  Surgeon: Elam Dutch, MD;  Location: [facility];  Service: Cardiovascular;  Laterality: N/A;  . AMPUTATION Right 08/17/2016   Procedure: RIGHT BELOW KNEE AMPUTATION;  Surgeon: Elam Dutch, MD;  Location: [facility];  Service: Vascular;  Laterality: Right;  . AMPUTATION Right 09/12/2016   Procedure: AMPUTATION ABOVE KNEE;  Surgeon: Newt Minion, MD;   Location: [facility];  Service: Orthopedics;  Laterality: Right;  . AMPUTATION Left 08/12/2016   Procedure: LEFT BELOW KNEE AMPUTATION;  Surgeon: Newt Minion, MD;  Location: [facility];  Service: Orthopedics;  Laterality: Left;  . APPLICATION OF WOUND VAC Right 09/12/2016   Procedure: APPLICATION OF WOUND VAC ABOVE KNEE;  Surgeon: Newt Minion, MD;  Location: [facility];  Service: Orthopedics;  Laterality: Right;  . LOWER EXTREMITY ANGIOGRAPHY Bilateral 08/11/2016   Procedure: Lower Extremity Angiography;  Surgeon: Waynetta Sandy, MD;  Location: [facility];  Service: Cardiovascular;  Laterality: Bilateral;  . PERIPHERAL VASCULAR BALLOON ANGIOPLASTY Left 08/11/2016   Procedure: Peripheral Vascular Balloon Angioplasty;  Surgeon: Waynetta Sandy, MD;  Location: [facility];  Service: Cardiovascular;  Laterality: Left;  SFA  . THYROID SURGERY     Social History   Occupational History  . drives a forklift    Social History Main Topics  . Smoking status: Current Every Day Smoker    Packs/day: 0.75    Years: 45.00    Types: Cigarettes    Last attempt to quit: 11/12/2014  . Smokeless tobacco: Never Used  . Alcohol use No     Comment: h/o heavy use; stopped drinking in 7/16  . Drug use: No  . Sexual activity: Not Currently

## 2016-12-21 ENCOUNTER — Telehealth (INDEPENDENT_AMBULATORY_CARE_PROVIDER_SITE_OTHER): Payer: Self-pay | Admitting: Orthopedic Surgery

## 2016-12-21 DIAGNOSIS — IMO0002 Reserved for concepts with insufficient information to code with codable children: Secondary | ICD-10-CM

## 2016-12-21 DIAGNOSIS — Z89512 Acquired absence of left leg below knee: Secondary | ICD-10-CM

## 2016-12-21 NOTE — Telephone Encounter (Signed)
[facility] REQUESTS ORDER FOR PT, DIDN'T SPECIFY DURATION WHEN ASKED.  [phone]

## 2016-12-22 NOTE — Anesthesia Postprocedure Evaluation (Signed)
Anesthesia Post Note  Patient: Todd [name]  Procedure(s) Performed: Procedure(s) (LRB): RIGHT BELOW KNEE AMPUTATION (Right)     Anesthesia Post Evaluation  Last Vitals:  Vitals:   08/18/16 1944 08/19/16 0549  BP: (!) 148/76 (!) 148/61  Pulse: 79 71  Resp: 18 18  Temp: 36.8 C 36.9 C    Last Pain:  Vitals:   08/19/16 1548  TempSrc:   PainSc: [place]

## 2016-12-22 NOTE — Addendum Note (Signed)
Addendum  created 12/22/16 1625 by Miller, Warren Ray, MD   Sign clinical note    

## 2016-12-23 NOTE — Telephone Encounter (Signed)
Will follow up on Monday, may just need to put order in to [facility] for gait training, this is typically who we use. Just need to confirm with [facility] this is what they are asking for in regards to therapy.

## 2016-12-26 NOTE — Telephone Encounter (Signed)
I called and spoke with Juliann Pulse advised I would just put order in [facility], she would like a copy for her records, they do use Robin with [facility]. Order placed in EPIC and faxed to [facility].

## 2016-12-26 NOTE — Addendum Note (Signed)
Addended by: Maxcine Ham on: 12/26/2016 03:53 PM   Modules accepted: Orders

## 2016-12-29 ENCOUNTER — Ambulatory Visit: Payer: BLUE CROSS/BLUE SHIELD | Attending: Family Medicine | Admitting: Physical Therapy

## 2016-12-29 DIAGNOSIS — M6249 Contracture of muscle, multiple sites: Secondary | ICD-10-CM | POA: Diagnosis present

## 2016-12-29 DIAGNOSIS — M6281 Muscle weakness (generalized): Secondary | ICD-10-CM | POA: Diagnosis present

## 2016-12-29 DIAGNOSIS — R2681 Unsteadiness on feet: Secondary | ICD-10-CM | POA: Diagnosis present

## 2016-12-29 DIAGNOSIS — R2689 Other abnormalities of gait and mobility: Secondary | ICD-10-CM | POA: Diagnosis present

## 2016-12-29 NOTE — Therapy (Signed)
[facility] [address] [place], [place], [address] Phone: [phone]   Fax:  [phone]  Physical Therapy Evaluation  Patient Details  Name: Todd [name] MRN: [id number] Date of Birth: February 13, 1956 Referring Provider: [name], MD  Encounter Date: 12/29/2016      PT End of Session - 12/29/16 2027    Visit Number 1   Number of Visits 35   Date for PT Re-Evaluation 04/28/17   Authorization Type BC[employer]   PT Start Time 0845   PT Stop Time 0945   PT Time Calculation (min) 60 min   Equipment Utilized During Treatment Gait belt   Activity Tolerance Patient tolerated treatment well   Behavior During Therapy [facility] for tasks assessed/performed      Past Medical History:  Diagnosis Date   AKI (acute kidney injury) ([place])    Constipated    Diabetes mellitus without complication ([place])    Diarrhea    Elevated LFTs    Goiter    Gout    Hyperlipidemia    Hypertension    Leukocytosis    Reactive thrombocytosis    Right BKA infection ([place]) 08/2016   Right leg pain    Sepsis due to undetermined organism ([place])    Thyroid disease    Wound infection after surgery 08/2016    Past Surgical History:  Procedure Laterality Date   ABDOMINAL AORTOGRAM N/A 08/11/2016   Procedure: Abdominal Aortogram;  Surgeon: Waynetta Sandy, MD;  Location: [facility];  Service: Cardiovascular;  Laterality: N/A;   ABDOMINAL AORTOGRAM W/LOWER EXTREMITY N/A 08/15/2016   Procedure: Abdominal Aortogram w/Lower Extremity;  Surgeon: Elam Dutch, MD;  Location: [facility];  Service: Cardiovascular;  Laterality: N/A;   AMPUTATION Right 08/17/2016   Procedure: RIGHT BELOW KNEE AMPUTATION;  Surgeon: Elam Dutch, MD;  Location: [facility];  Service: Vascular;  Laterality: Right;   AMPUTATION Right 09/12/2016   Procedure: AMPUTATION ABOVE KNEE;  Surgeon: Newt Minion, MD;  Location: [facility];  Service:  Orthopedics;  Laterality: Right;   AMPUTATION Left 08/12/2016   Procedure: LEFT BELOW KNEE AMPUTATION;  Surgeon: Newt Minion, MD;  Location: [facility];  Service: Orthopedics;  Laterality: Left;   APPLICATION OF WOUND VAC Right 09/12/2016   Procedure: APPLICATION OF WOUND VAC ABOVE KNEE;  Surgeon: Newt Minion, MD;  Location: [facility];  Service: Orthopedics;  Laterality: Right;   LOWER EXTREMITY ANGIOGRAPHY Bilateral 08/11/2016   Procedure: Lower Extremity Angiography;  Surgeon: Waynetta Sandy, MD;  Location: [facility];  Service: Cardiovascular;  Laterality: Bilateral;   PERIPHERAL VASCULAR BALLOON ANGIOPLASTY Left 08/11/2016   Procedure: Peripheral Vascular Balloon Angioplasty;  Surgeon: Waynetta Sandy, MD;  Location: [facility];  Service: Cardiovascular;  Laterality: Left;  SFA   THYROID SURGERY      There were no vitals filed for this visit.       Subjective Assessment - 12/29/16 0850    Subjective This 61yo male underwent a right Transfemoral Amputation on 09/12/2016 which was revised from a right Transtibial Amputation on 08/17/2016. He also underwent a left Transtibial Amputation on 08/12/2016. He recieved prostheses 12/27/2016. He was not worn prostheses outside of prosthetist office.    Patient is accompained by: Family member   Pertinent History R  TFA 09/12/2016, L TTA 08/12/2016, gout, HTN, DM, Neuropathy, renal failure   Limitations Lifting;Standing;Walking;House hold activities   Patient Stated Goals Use prostheses to get back into community.  Get out of w/c.   Currently in Pain? No/denies            [facility] PT Assessment - 12/29/16 0845      Assessment   Medical Diagnosis Rt TFA Lt TTA   Referring Provider [name], MD   Onset Date/Surgical Date 12/27/16  prostheses delivery   Hand Dominance Right     Precautions   Precautions Fall     Restrictions   Weight Bearing Restrictions No     Balance Screen   Has the patient fallen in the past 6  months No   Has the patient had a decrease in activity level because of a fear of falling?  Yes   Is the patient reluctant to leave their home because of a fear of falling?  No  uses w/c     Home Environment   Living Environment Private residence   Living Arrangements Parent  80yo mother   Type of [place] Access Level entry  2" threshold   Home Layout One level  basement 5 steps with no railings   Home Equipment Bedside commode;Wheelchair - manual;Hospital bed     Prior Function   Level of Independence Independent with gait;Independent   Vocation On disability  filing for disability   Vocation Requirements --   Leisure fishing     Observation/Other Assessments   Focus on Therapeutic Outcomes (FOTO)  17.37 Functional Status   Activities of Balance Confidence Scale (ABC Scale)  0.0%     Posture/Postural Control   Posture/Postural Control Postural limitations   Postural Limitations Rounded Shoulders;Forward head;Flexed trunk;Weight shift left     ROM / Strength   AROM / PROM / Strength Strength;PROM     PROM   Overall PROM  Deficits   PROM Assessment Site Hip;Knee   Right/Left Hip Right;Left   Left Hip Extension -20  Thomas position   Right/Left Knee Left   Left Knee Extension -12     Strength   Overall Strength Deficits   Strength Assessment Site Hip;Knee   Right/Left Hip Right;Left   Right Hip Flexion 4/5   Right Hip Extension 3/5   Right Hip ABduction 3+/5   Left Hip Flexion 4/5   Left Hip Extension 3/5   Left Hip ABduction 3+/5   Right/Left Knee Left   Left Knee Flexion 4/5   Left Knee Extension 4/5     Right Hip   Right Hip Extension -42  Thomas position     Transfers   Transfers Sit to [employer] to [employer]   Sit to Stand 2: Max assist;With upper extremity assist;With armrests;From chair/3-in-1  RW to stabilize   Sit to Stand Details (indicate cue type and reason) PT demo & instructed in technique prior & verbal /  manual cues during   Stand to Sit 3: Mod assist;With upper extremity assist;With armrests;To chair/3-in-1  RW for stabilization   Stand to Sit Details PT demo & instructed in technique prior & verbal / manual cues during   Lateral/Scoot Transfers 6: Modified independent (Device/Increase time);5: Supervision;4: Min guard   Lateral/Scoot Transfer Details (indicate cue type and reason) Mod Indep. without prostheses.  SBA with TTA prosthesis.   Min gaurd with Bil. prostheses     Ambulation/Gait   Ambulation/Gait Yes   Ambulation/Gait Assistance 1: +2 Total assist   Ambulation/Gait Assistance Details PT demo, verbally instructed in technique prior;  Excessive weight bearing on RW;  Sequence RLE, RW, LLE, RW....   Ambulation  Distance (Feet) 10 Feet   Assistive device Rolling walker;Prostheses   Gait Pattern Step-through pattern;Decreased step length - left;Decreased stride length;Decreased stance time - right;Decreased weight shift to right;Right circumduction;Right hip hike;Antalgic;Left flexed knee in stance;Right flexed knee in stance;Wide base of support;Trunk flexed;Poor foot clearance - left  PT manually locking TFA prosthetic knee and Lt advancement   Ambulation Surface Indoor;Level     Balance   Balance Assessed Yes     Static Standing Balance   Static Standing - Balance Support Bilateral upper extremity supported  RW support   Static Standing - Level of Assistance 4: Min assist  once PT positioned COG over base   Static Standing - Comment/# of Minutes 1 minute     Dynamic Standing Balance   Dynamic Standing - Balance Support Bilateral upper extremity supported;Left upper extremity supported;Right upper extremity supported  RW support   Dynamic Standing - Level of Assistance 3: Mod assist   Dynamic Standing - Comments Scanning right /left & up/down with cervical movement only.  Reaches with single UE reach 2" anterior to RW with either UE.          Prosthetics Assessment -  12/29/16 0845      Prosthetics   Prosthetic Care Dependent with Skin check;Residual limb care;Prosthetic cleaning;Ply sock cleaning;Correct ply sock adjustment;Proper wear schedule/adjustment;Proper weight-bearing schedule/adjustment   Donning prosthesis  Max assist;Mod assist  MaxA TFA & ModA TTA   Doffing prosthesis  Min assist   Current prosthetic wear tolerance (days/week)  Patient has not worn prosthesis outside of prosthetist office   Current prosthetic wear tolerance (#hours/day)  Patient tolerated 30 minutes wear during PT evaluation   Current prosthetic weight-bearing tolerance (hours/day)  Pt tolerated standing 5 min with RW support with TFA groin pain 5/10.    Edema TFA pitting edema,  TTA non-pitting edema   Residual limb condition  No open areas on either LE. Dry scaly skin. Dark skin distal TTA limb.  No hair growth.            Objective measurements completed on examination: See above findings.          [facility] Adult PT Treatment/Exercise - 12/29/16 0845      Prosthetics   Prosthetic Care Comments  PT instructed to wear TTA prosthesis & TFA liner only 2 hrs 2x/day.  TFA prosthesis not wearing outside of PT until able to safely transfer including sit to/from stand. Increase every week.    Education Provided Skin check;Residual limb care;Prosthetic cleaning;Correct ply sock adjustment;Proper Donning;Proper Doffing;Proper wear schedule/adjustment;Other (comment)  see prosthetic care comments   Person(s) Educated Patient;Other (comment)  friend   Education Method Explanation;Demonstration;Verbal cues;Tactile cues   Education Method Verbalized understanding;Returned demonstration;Tactile cues required;Verbal cues required;Needs further instruction                  PT Short Term Goals - 12/29/16 2019      PT SHORT TERM GOAL #1   Title Patient donnes TTA prosthesis modified independent and TFA prosthesis with minA. (Target Date: 01/27/2017)   Time 1   Period  Months   Status New     PT SHORT TERM GOAL #2   Title Patient tolerates wear of TTA prosthesis >8 hrs total /day & TFA prosthesis >2 hrs /day without skin issues. (Target Date: 01/27/2017)   Time 1   Period Months   Status New     PT SHORT TERM GOAL #3   Title Sit to /from stand w/c to [employer]  with minA with prostheses. (Target Date: 01/27/2017)   Time 1   Period Months   Status New     PT SHORT TERM GOAL #4   Title Patient reaches 5" anteriorly with RW support with prostheses with supervision.  (Target Date: 01/27/2017)   Time 1   Period Months   Status New     PT SHORT TERM GOAL #5   Title Patient ambulates 108' with RW & prostheses with modA.  (Target Date: 01/27/2017)   Time 1   Period Months   Status New           PT Long Term Goals - 12/29/16 2007      PT LONG TERM GOAL #1   Title Patient verbalizes and demonstrates understanding of prosthetic care to enable safe use of prostheses.  (Target Date: 04/28/2017)   Time 4   Period Months   Status New     PT LONG TERM GOAL #2   Title Patient tolerates wear of TTA prosthesis >90% and TFA >75% of awake hours to enable function throughout his day. (Target Date: 04/28/2017)   Time 4   Period Months   Status New     PT LONG TERM GOAL #3   Title Patient performs transfers including sit to/from stand and stand pivot transfers with LRAD & prostheses modified independent.  (Target Date: 04/28/2017)   Time 4   Period Months   Status New     PT LONG TERM GOAL #4   Title Patient performs standing balance with LRAD & prostheses reaching 10" anteriorly, reaching to floor, looking over shoulders and managing clothes modified independent.  (Target Date: 04/28/2017)   Time 4   Period Months   Status New     PT LONG TERM GOAL #5   Title Patient ambulates 300' with LRAD & prostheses modified independent to enable community mobility.  (Target Date: 04/28/2017)   Time 4   Period Months   Status New     Additional Long Term Goals    Additional Long Term Goals Yes     PT LONG TERM GOAL #6   Title Patient negotiates ramps, curbs & stairs with LRAD & prostheses modified independent to enable community access.  (Target Date: 04/28/2017)   Time 4   Period Months   Status New     PT LONG TERM GOAL #7   Title Activities of Balance Confidence score >15%.  (Target Date: 04/28/2017)   Baseline Initial ABC 0.0%   Time 4   Period Months   Status New                Plan - 12/29/16 2022    Clinical Impression Statement This 61yo male underwent amputations of bilateral lower extremities with combo Transfemoral / Transtibial Amputations. He is dependent in prosthetic care including donning and doffing. He has not worn prostheses outside of prosthetic or PT office. Limited wear of prostheses impairs his ability to function. Patients standing balance is dependent and high risk of falls with standing ADLs. His gait requires total assist of 2 people ambulating 10 with RW & prostheses. He is dependent in negotiating ramps, curbs and stairs with prostheses. Patient would benefit from skilled PT to use prostheses functionally.    History and Personal Factors relevant to plan of care: R  TFA 09/12/2016, L TTA 08/12/2016, gout, HTN, DM, Neuropathy, renal failure   Clinical Presentation Evolving   Clinical Decision Making Moderate   Rehab Potential Good   PT Frequency  2x / week   PT Duration Other (comment)  4 months   PT Treatment/Interventions ADLs/Self Care Home Management;DME Instruction;Gait training;Stair training;Functional mobility training;Therapeutic activities;Therapeutic exercise;Balance training;Neuromuscular re-education;Patient/family education;Prosthetic Training;Manual techniques   PT Next Visit Plan review prosthetic care. work on sit to/from stand transfers including to sink.  standing balance. Once able to stand safely at sink, HEP.    Consulted and Agree with Plan of Care Patient      Patient will benefit  from skilled therapeutic intervention in order to improve the following deficits and impairments:  Abnormal gait, Decreased activity tolerance, Decreased balance, Decreased endurance, Decreased knowledge of use of DME, Decreased mobility, Decreased range of motion, Decreased skin integrity, Decreased strength, Postural dysfunction, Prosthetic Dependency, Pain  Visit Diagnosis: Other abnormalities of gait and mobility  Unsteadiness on feet  Muscle weakness (generalized)  Contracture of muscle, multiple sites     Problem List Patient Active Problem List   Diagnosis Date Noted   History of right above knee amputation ([place]) 09/21/2016   Acquired absence of left leg below knee ([place]) 09/21/2016   Transaminitis    Diabetic peripheral neuropathy ([place]) 07/21/2016   Renal failure 07/04/2016   Gout 07/04/2016   Hyperuricemia 06/21/2016   HTN (hypertension) 01/05/2016   Diabetes type 2, uncontrolled ([place]) 01/05/2016   Hypothyroidism 05/10/2011    Ezana Hubbert PT, DPT 12/29/2016, 8:43 PM  [facility] [address] [place], [place], [address] Phone: [phone]   Fax:  [phone]  Name: Ihor Meinzer MRN: [id number] Date of Birth: 07-Feb-1956

## 2017-01-04 ENCOUNTER — Ambulatory Visit: Payer: BLUE CROSS/BLUE SHIELD | Admitting: Physical Therapy

## 2017-01-06 ENCOUNTER — Ambulatory Visit: Payer: BLUE CROSS/BLUE SHIELD | Admitting: Physical Therapy

## 2017-01-06 ENCOUNTER — Encounter: Payer: Self-pay | Admitting: Physical Therapy

## 2017-01-06 DIAGNOSIS — R2689 Other abnormalities of gait and mobility: Secondary | ICD-10-CM

## 2017-01-06 DIAGNOSIS — R2681 Unsteadiness on feet: Secondary | ICD-10-CM

## 2017-01-06 DIAGNOSIS — M6281 Muscle weakness (generalized): Secondary | ICD-10-CM

## 2017-01-08 NOTE — Therapy (Signed)
[facility] [address] [place], [place], [address] Phone: [phone]   Fax:  [phone]  Physical Therapy Treatment  Patient Details  Name: Arcenio Mullaly MRN: [id number] Date of Birth: September 16, 1955 Referring Provider: [name], MD  Encounter Date: 01/06/2017   01/06/17 1026  PT Visits / Re-Eval  Visit Number 2  Number of Visits 35  Date for PT Re-Evaluation 04/28/17  Authorization  Authorization Type BCBS  PT Time Calculation  PT Start Time 1020  PT Stop Time 1100  PT Time Calculation (min) 40 min  PT - End of Session  Equipment Utilized During Treatment Gait belt  Activity Tolerance Patient tolerated treatment well  Behavior During Therapy [facility] for tasks assessed/performed     Past Medical History:  Diagnosis Date  . AKI (acute kidney injury) ([place])   . Constipated   . Diabetes mellitus without complication ([place])   . Diarrhea   . Elevated LFTs   . Goiter   . Gout   . Hyperlipidemia   . Hypertension   . Leukocytosis   . Reactive thrombocytosis   . Right BKA infection ([place]) 08/2016  . Right leg pain   . Sepsis due to undetermined organism ([place])   . Thyroid disease   . Wound infection after surgery 08/2016    Past Surgical History:  Procedure Laterality Date  . ABDOMINAL AORTOGRAM N/A 08/11/2016   Procedure: Abdominal Aortogram;  Surgeon: Waynetta Sandy, MD;  Location: [facility];  Service: Cardiovascular;  Laterality: N/A;  . ABDOMINAL AORTOGRAM W/LOWER EXTREMITY N/A 08/15/2016   Procedure: Abdominal Aortogram w/Lower Extremity;  Surgeon: Elam Dutch, MD;  Location: [facility];  Service: Cardiovascular;  Laterality: N/A;  . AMPUTATION Right 08/17/2016   Procedure: RIGHT BELOW KNEE AMPUTATION;  Surgeon: Elam Dutch, MD;  Location: [facility];  Service: Vascular;  Laterality: Right;  . AMPUTATION Right 09/12/2016   Procedure: AMPUTATION ABOVE KNEE;  Surgeon: Newt Minion, MD;  Location: [facility];  Service: Orthopedics;  Laterality: Right;  . AMPUTATION Left 08/12/2016   Procedure: LEFT BELOW KNEE AMPUTATION;  Surgeon: Newt Minion, MD;  Location: [facility];  Service: Orthopedics;  Laterality: Left;  . APPLICATION OF WOUND VAC Right 09/12/2016   Procedure: APPLICATION OF WOUND VAC ABOVE KNEE;  Surgeon: Newt Minion, MD;  Location: [facility];  Service: Orthopedics;  Laterality: Right;  . LOWER EXTREMITY ANGIOGRAPHY Bilateral 08/11/2016   Procedure: Lower Extremity Angiography;  Surgeon: Waynetta Sandy, MD;  Location: [facility];  Service: Cardiovascular;  Laterality: Bilateral;  . PERIPHERAL VASCULAR BALLOON ANGIOPLASTY Left 08/11/2016   Procedure: Peripheral Vascular Balloon Angioplasty;  Surgeon: Waynetta Sandy, MD;  Location: [facility];  Service: Cardiovascular;  Laterality: Left;  SFA  . THYROID SURGERY      There were no vitals filed for this visit.     01/06/17 1024  Symptoms/Limitations  Subjective No new complaints. Had issues with finding alcohol free lotion, however he did find something, can't remember the name of it. No falls to report. No pain to report. Has been wearing the liners as instructed , and the BKA prosthesis. Has not been wearing the AKA prosthesis.   Pertinent History R  TFA 09/12/2016, L TTA 08/12/2016, gout, HTN, DM, Neuropathy, renal failure  Limitations Lifting;Standing;Walking;House hold activities  Patient Stated Goals Use prostheses to get back into community. Get out of w/c.      01/06/17 1026  Transfers  Transfers Sit to Stand;Stand to Sit  Sit to Stand 2: Max assist;With upper extremity assist;With armrests;From chair/3-in-1  Sit to Stand Details Tactile cues for sequencing;Tactile cues for weight shifting;Verbal cues for precautions/safety;Verbal cues for technique;Verbal cues for sequencing;Verbal cues for safe use of DME/AE;Manual facilitation for weight shifting  Sit to Stand Details (indicate cue  type and reason) cues needed for bil prosthetic placement, weight shifting and hand placement. 2 person max assist for wheelchair<>walker x1 rep to don AKA prosthesis. x 2 at sink progressing to max assist of 1 person x last 2 reps (4 reps total at sink)                            Stand to Sit 3: Mod assist;With upper extremity assist;With armrests;To chair/3-in-1  Stand to Sit Details (indicate cue type and reason) Tactile cues for weight shifting;Tactile cues for placement;Tactile cues for weight beaing;Verbal cues for precautions/safety;Verbal cues for technique;Verbal cues for safe use of DME/AE;Manual facilitation for weight shifting  Stand to Sit Details cues for sequencing and technique to sit down. total assist for AKA as pt unable to follow cues for how to unlock it before sitting down to wheelchair.   Prosthetics  Current prosthetic wear tolerance (days/week)  daily with liners   Current prosthetic wear tolerance (#hours/day)  2 hours 2x day for liners  Residual limb condition  bil intact without issues  Education Provided Residual limb care;Proper Donning;Proper Doffing;Proper wear schedule/adjustment;Proper weight-bearing schedule/adjustment  Person(s) Educated Patient;Other (comment) (uncle)  Education Method Explanation;Demonstration;Verbal cues  Education Method Verbalized understanding;Returned demonstration;Verbal cues required;Needs further instruction  Donning Prosthesis 3;4 (with AKA, min with BKA)           PT Short Term Goals - 12/29/16 2019      PT SHORT TERM GOAL #1   Title Patient donnes TTA prosthesis modified independent and TFA prosthesis with minA. (Target Date: 01/27/2017)   Time 1   Period Months   Status New     PT SHORT TERM GOAL #2   Title Patient tolerates wear of TTA prosthesis >8 hrs total /day & TFA prosthesis >2 hrs /day without skin issues. (Target Date: 01/27/2017)   Time 1   Period Months   Status New     PT SHORT TERM GOAL #3   Title Sit  to /from stand w/c to RW with minA with prostheses. (Target Date: 01/27/2017)   Time 1   Period Months   Status New     PT SHORT TERM GOAL #4   Title Patient reaches 5" anteriorly with RW support with prostheses with supervision.  (Target Date: 01/27/2017)   Time 1   Period Months   Status New     PT SHORT TERM GOAL #5   Title Patient ambulates 7' with RW & prostheses with modA.  (Target Date: 01/27/2017)   Time 1   Period Months   Status New           PT Long Term Goals - 12/29/16 2007      PT LONG TERM GOAL #1   Title Patient verbalizes and demonstrates understanding of prosthetic care to enable safe use of prostheses.  (Target Date: 04/28/2017)   Time 4   Period Months   Status New     PT LONG TERM GOAL #2   Title Patient tolerates wear of TTA prosthesis >90% and TFA >75% of awake hours to enable function throughout his day. (Target  Date: 04/28/2017)   Time 4   Period Months   Status New     PT LONG TERM GOAL #3   Title Patient performs transfers including sit to/from stand and stand pivot transfers with LRAD & prostheses modified independent.  (Target Date: 04/28/2017)   Time 4   Period Months   Status New     PT LONG TERM GOAL #4   Title Patient performs standing balance with LRAD & prostheses reaching 10" anteriorly, reaching to floor, looking over shoulders and managing clothes modified independent.  (Target Date: 04/28/2017)   Time 4   Period Months   Status New     PT LONG TERM GOAL #5   Title Patient ambulates 300' with LRAD & prostheses modified independent to enable community mobility.  (Target Date: 04/28/2017)   Time 4   Period Months   Status New     Additional Long Term Goals   Additional Long Term Goals Yes     PT LONG TERM GOAL #6   Title Patient negotiates ramps, curbs & stairs with LRAD & prostheses modified independent to enable community access.  (Target Date: 04/28/2017)   Time 4   Period Months   Status New     PT LONG TERM GOAL #7    Title Activities of Balance Confidence score >15%.  (Target Date: 04/28/2017)   Baseline Initial ABC 0.0%   Time 4   Period Months   Status New      01/06/17 1026  Plan  Clinical Impression Statement Today's skilled session continued to address prosthetic education and transfer training with bil prostheses. Pt continued to need increased assistance with sit/stands and total assistance to manage AKA prosthesis making it not safe for him to stand at home.  Pt should benefit from continued PT to progress toward unmet goals.   Pt will benefit from skilled therapeutic intervention in order to improve on the following deficits Abnormal gait;Decreased activity tolerance;Decreased balance;Decreased endurance;Decreased knowledge of use of DME;Decreased mobility;Decreased range of motion;Decreased skin integrity;Decreased strength;Postural dysfunction;Prosthetic Dependency;Pain  Rehab Potential Good  PT Frequency 2x / week  PT Duration Other (comment) (4 months)  PT Treatment/Interventions ADLs/Self Care Home Management;DME Instruction;Gait training;Stair training;Functional mobility training;Therapeutic activities;Therapeutic exercise;Balance training;Neuromuscular re-education;Patient/family education;Prosthetic Training;Manual techniques  PT Next Visit Plan review prosthetic care. work on sit to/from stand transfers try parallel bars then progress toward sink as pt learns to controll knee more.  standing balance. Once able to stand safely at sink, HEP.   Consulted and Agree with Plan of Care Patient      Patient will benefit from skilled therapeutic intervention in order to improve the following deficits and impairments:  Abnormal gait, Decreased activity tolerance, Decreased balance, Decreased endurance, Decreased knowledge of use of DME, Decreased mobility, Decreased range of motion, Decreased skin integrity, Decreased strength, Postural dysfunction, Prosthetic Dependency, Pain  Visit  Diagnosis: Other abnormalities of gait and mobility  Unsteadiness on feet  Muscle weakness (generalized)     Problem List Patient Active Problem List   Diagnosis Date Noted  . History of right above knee amputation ([place]) 09/21/2016  . Acquired absence of left leg below knee ([place]) 09/21/2016  . Transaminitis   . Diabetic peripheral neuropathy ([place]) 07/21/2016  . Renal failure 07/04/2016  . Gout 07/04/2016  . Hyperuricemia 06/21/2016  . HTN (hypertension) 01/05/2016  . Diabetes type 2, uncontrolled ([place]) 01/05/2016  . Hypothyroidism 05/10/2011    Willow Ora, PTA, [facility] [address], [address]  [place], [place] [address] [phone] 01/08/17, 12:45 PM   Name: Davan Nawabi MRN: [id number] Date of Birth: 1955/10/06

## 2017-01-10 ENCOUNTER — Ambulatory Visit (INDEPENDENT_AMBULATORY_CARE_PROVIDER_SITE_OTHER): Payer: BLUE CROSS/BLUE SHIELD | Admitting: Family Medicine

## 2017-01-10 ENCOUNTER — Ambulatory Visit: Payer: BLUE CROSS/BLUE SHIELD | Admitting: Physical Therapy

## 2017-01-10 ENCOUNTER — Encounter: Payer: Self-pay | Admitting: Physical Therapy

## 2017-01-10 DIAGNOSIS — E1122 Type 2 diabetes mellitus with diabetic chronic kidney disease: Secondary | ICD-10-CM

## 2017-01-10 DIAGNOSIS — M6281 Muscle weakness (generalized): Secondary | ICD-10-CM

## 2017-01-10 DIAGNOSIS — R2689 Other abnormalities of gait and mobility: Secondary | ICD-10-CM | POA: Diagnosis not present

## 2017-01-10 DIAGNOSIS — I5022 Chronic systolic (congestive) heart failure: Secondary | ICD-10-CM

## 2017-01-10 DIAGNOSIS — I130 Hypertensive heart and chronic kidney disease with heart failure and stage 1 through stage 4 chronic kidney disease, or unspecified chronic kidney disease: Secondary | ICD-10-CM | POA: Diagnosis not present

## 2017-01-10 DIAGNOSIS — R2681 Unsteadiness on feet: Secondary | ICD-10-CM

## 2017-01-10 DIAGNOSIS — I2510 Atherosclerotic heart disease of native coronary artery without angina pectoris: Secondary | ICD-10-CM

## 2017-01-10 DIAGNOSIS — N183 Chronic kidney disease, stage 3 (moderate): Secondary | ICD-10-CM

## 2017-01-10 NOTE — Therapy (Signed)
[facility] [address] [place], [place], [address] Phone: [phone]   Fax:  [phone]  Physical Therapy Treatment  Patient Details  Name: Todd [name] MRN: [id number] Date of Birth: 1956/06/13 Referring Provider: [name], MD  Encounter Date: 01/10/2017      PT End of Session - 01/10/17 0810    Visit Number 3   Number of Visits 35   Date for PT Re-Evaluation 04/28/17   Authorization Type BCBS   PT Start Time 0805   PT Stop Time 0845   PT Time Calculation (min) 40 min   Equipment Utilized During Treatment Gait belt   Activity Tolerance Patient tolerated treatment well   Behavior During Therapy [facility] for tasks assessed/performed      Past Medical History:  Diagnosis Date  . AKI (acute kidney injury) ([place])   . Constipated   . Diabetes mellitus without complication ([place])   . Diarrhea   . Elevated LFTs   . Goiter   . Gout   . Hyperlipidemia   . Hypertension   . Leukocytosis   . Reactive thrombocytosis   . Right BKA infection ([place]) 08/2016  . Right leg pain   . Sepsis due to undetermined organism ([place])   . Thyroid disease   . Wound infection after surgery 08/2016    Past Surgical History:  Procedure Laterality Date  . ABDOMINAL AORTOGRAM N/A 08/11/2016   Procedure: Abdominal Aortogram;  Surgeon: Waynetta Sandy, MD;  Location: [facility];  Service: Cardiovascular;  Laterality: N/A;  . ABDOMINAL AORTOGRAM W/LOWER EXTREMITY N/A 08/15/2016   Procedure: Abdominal Aortogram w/Lower Extremity;  Surgeon: Elam Dutch, MD;  Location: [facility];  Service: Cardiovascular;  Laterality: N/A;  . AMPUTATION Right 08/17/2016   Procedure: RIGHT BELOW KNEE AMPUTATION;  Surgeon: Elam Dutch, MD;  Location: [facility];  Service: Vascular;  Laterality: Right;  . AMPUTATION Right 09/12/2016   Procedure: AMPUTATION ABOVE KNEE;  Surgeon: Newt Minion, MD;  Location: [facility];  Service:  Orthopedics;  Laterality: Right;  . AMPUTATION Left 08/12/2016   Procedure: LEFT BELOW KNEE AMPUTATION;  Surgeon: Newt Minion, MD;  Location: [facility];  Service: Orthopedics;  Laterality: Left;  . APPLICATION OF WOUND VAC Right 09/12/2016   Procedure: APPLICATION OF WOUND VAC ABOVE KNEE;  Surgeon: Newt Minion, MD;  Location: [facility];  Service: Orthopedics;  Laterality: Right;  . LOWER EXTREMITY ANGIOGRAPHY Bilateral 08/11/2016   Procedure: Lower Extremity Angiography;  Surgeon: Waynetta Sandy, MD;  Location: [facility];  Service: Cardiovascular;  Laterality: Bilateral;  . PERIPHERAL VASCULAR BALLOON ANGIOPLASTY Left 08/11/2016   Procedure: Peripheral Vascular Balloon Angioplasty;  Surgeon: Waynetta Sandy, MD;  Location: [facility];  Service: Cardiovascular;  Laterality: Left;  SFA  . THYROID SURGERY      There were no vitals filed for this visit.      Subjective Assessment - 01/10/17 0809    Subjective No new complaints. No falls or pain to report. Wearing liners and BKA prosthesis at home as instructed.   Patient is accompained by: Family member   Pertinent History R  TFA 09/12/2016, L TTA 08/12/2016, gout, HTN, DM, Neuropathy, renal failure   Limitations Lifting;Standing;Walking;House hold activities   Patient Stated Goals Use prostheses to get back into community. Get out of w/c.   Currently in Pain? No/denies             [facility] Adult PT Treatment/Exercise -  01/10/17 0811      Transfers   Transfers Sit to Stand;Stand to Sit   Sit to Stand 3: Mod assist   Sit to Stand Details Verbal cues for sequencing;Verbal cues for safe use of DME/AE;Verbal cues for technique;Verbal cues for precautions/safety;Manual facilitation for weight shifting;Manual facilitation for placement;Manual facilitation for weight bearing   Sit to Stand Details (indicate cue type and reason) cues on sequencing and technique to stand.   Stand to Sit 3: Mod assist;4: Min assist   Stand  to Sit Details (indicate cue type and reason) Verbal cues for sequencing;Verbal cues for technique;Verbal cues for precautions/safety;Verbal cues for safe use of DME/AE;Manual facilitation for weight shifting;Manual facilitation for placement;Manual facilitation for weight bearing   Stand to Sit Details cues to reach back and on technique to unlock prothesis with sitting down.   Comments mod assist of 2 people for sit<>stand x1 wheelchair<>RW to fully seat himself into his AKA prosthesis. mod assist of 1 progressing to min assist of 1 with sit<>stands in parallel bars x2 reps.                             Ambulation/Gait   Ambulation/Gait Yes   Ambulation/Gait Assistance 3: Mod assist   Ambulation/Gait Assistance Details cues for posture, sequencing, hand advancement along bars and manual assist to advance right prosthesis. cues only to ensure locked and for weight shifting  with PTA guarding knee with to prevent buckling.    Ambulation Distance (Feet) 10 Feet   Assistive device Parallel bars;Prostheses   Gait Pattern Step-through pattern;Decreased step length - left;Decreased stride length;Decreased stance time - right;Decreased weight shift to right;Right circumduction;Right hip hike;Antalgic;Left flexed knee in stance;Right flexed knee in stance;Wide base of support;Trunk flexed;Poor foot clearance - left   Ambulation Surface Level;Indoor     Prosthetics   Prosthetic Care Comments  Pt was instructed to increase his wear of both liners and TTA prosthesis to 3 hours 2 x day. Pt's leg rests on his wheelchair were changed out with pt shown to use the elevating leg rest pad as an amputee cushion to replace the amp pad that was removed from the wheelchair. Pt's wheelchair breaks were also fixed tighter.           Current prosthetic wear tolerance (days/week)  daily with liners and BKA prosthesis   Current prosthetic wear tolerance (#hours/day)  2 hours 2x day for liners and BKA prosthesis   Residual  limb condition  bil intact without issues   Education Provided Residual limb care;Proper Donning;Proper Doffing;Proper wear schedule/adjustment;Proper weight-bearing schedule/adjustment   Person(s) Educated Patient;Other (comment)  uncle   Education Method Explanation;Demonstration;Verbal cues   Education Method Verbalized understanding;Tactile cues required;Verbal cues required;Needs further instruction   Donning Prosthesis Supervision   Doffing Prosthesis Supervision             PT Short Term Goals - 12/29/16 2019      PT SHORT TERM GOAL #1   Title Patient donnes TTA prosthesis modified independent and TFA prosthesis with minA. (Target Date: 01/27/2017)   Time 1   Period Months   Status New     PT SHORT TERM GOAL #2   Title Patient tolerates wear of TTA prosthesis >8 hrs total /day & TFA prosthesis >2 hrs /day without skin issues. (Target Date: 01/27/2017)   Time 1   Period Months   Status New     PT SHORT TERM GOAL #3  Title Sit to /from stand w/c to RW with minA with prostheses. (Target Date: 01/27/2017)   Time 1   Period Months   Status New     PT SHORT TERM GOAL #4   Title Patient reaches 5" anteriorly with RW support with prostheses with supervision.  (Target Date: 01/27/2017)   Time 1   Period Months   Status New     PT SHORT TERM GOAL #5   Title Patient ambulates 62' with RW & prostheses with modA.  (Target Date: 01/27/2017)   Time 1   Period Months   Status New           PT Long Term Goals - 12/29/16 2007      PT LONG TERM GOAL #1   Title Patient verbalizes and demonstrates understanding of prosthetic care to enable safe use of prostheses.  (Target Date: 04/28/2017)   Time 4   Period Months   Status New     PT LONG TERM GOAL #2   Title Patient tolerates wear of TTA prosthesis >90% and TFA >75% of awake hours to enable function throughout his day. (Target Date: 04/28/2017)   Time 4   Period Months   Status New     PT LONG TERM GOAL #3   Title  Patient performs transfers including sit to/from stand and stand pivot transfers with LRAD & prostheses modified independent.  (Target Date: 04/28/2017)   Time 4   Period Months   Status New     PT LONG TERM GOAL #4   Title Patient performs standing balance with LRAD & prostheses reaching 10" anteriorly, reaching to floor, looking over shoulders and managing clothes modified independent.  (Target Date: 04/28/2017)   Time 4   Period Months   Status New     PT LONG TERM GOAL #5   Title Patient ambulates 300' with LRAD & prostheses modified independent to enable community mobility.  (Target Date: 04/28/2017)   Time 4   Period Months   Status New     Additional Long Term Goals   Additional Long Term Goals Yes     PT LONG TERM GOAL #6   Title Patient negotiates ramps, curbs & stairs with LRAD & prostheses modified independent to enable community access.  (Target Date: 04/28/2017)   Time 4   Period Months   Status New     PT LONG TERM GOAL #7   Title Activities of Balance Confidence score >15%.  (Target Date: 04/28/2017)   Baseline Initial ABC 0.0%   Time 4   Period Months   Status New               Plan - 01/10/17 0811    Clinical Impression Statement Today's skilled session continued to address prosthetic education and sit/stand transfers. Pt progressed from mod assist of 1 to min assist of 1 in the parallel bars. Pt is also beginning to demo improved prosthetic knee control with AKA prosthesis with skilled instruction. Pt is progressing toward goals and should benefit from continued PT to progress toward unmet goals.                             Rehab Potential Good   PT Frequency 2x / week   PT Duration Other (comment)  4 months   PT Treatment/Interventions ADLs/Self Care Home Management;DME Instruction;Gait training;Stair training;Functional mobility training;Therapeutic activities;Therapeutic exercise;Balance training;Neuromuscular re-education;Patient/family  education;Prosthetic Training;Manual techniques   PT Next  Visit Plan review prosthetic care. work on sit to/from stand transfers try parallel bars then progress toward sink as pt learns to controll knee more.  standing balance. Once able to stand safely at sink, HEP.    Consulted and Agree with Plan of Care Patient      Patient will benefit from skilled therapeutic intervention in order to improve the following deficits and impairments:  Abnormal gait, Decreased activity tolerance, Decreased balance, Decreased endurance, Decreased knowledge of use of DME, Decreased mobility, Decreased range of motion, Decreased skin integrity, Decreased strength, Postural dysfunction, Prosthetic Dependency, Pain  Visit Diagnosis: Other abnormalities of gait and mobility  Unsteadiness on feet  Muscle weakness (generalized)     Problem List Patient Active Problem List   Diagnosis Date Noted  . History of right above knee amputation ([place]) 09/21/2016  . Acquired absence of left leg below knee ([place]) 09/21/2016  . Transaminitis   . Diabetic peripheral neuropathy ([place]) 07/21/2016  . Renal failure 07/04/2016  . Gout 07/04/2016  . Hyperuricemia 06/21/2016  . HTN (hypertension) 01/05/2016  . Diabetes type 2, uncontrolled ([place]) 01/05/2016  . Hypothyroidism 05/10/2011    Willow Ora, PTA, [facility] [address], [place], [place] [address] [phone] 01/10/17, 8:33 PM   Name: Todd [name] MRN: [id number] Date of Birth: [birth year]

## 2017-01-11 ENCOUNTER — Ambulatory Visit: Payer: BLUE CROSS/BLUE SHIELD | Admitting: Physical Therapy

## 2017-01-12 ENCOUNTER — Encounter: Payer: Self-pay | Admitting: Physical Therapy

## 2017-01-12 ENCOUNTER — Ambulatory Visit: Payer: BLUE CROSS/BLUE SHIELD | Attending: Family Medicine | Admitting: Physical Therapy

## 2017-01-12 DIAGNOSIS — M6281 Muscle weakness (generalized): Secondary | ICD-10-CM

## 2017-01-12 DIAGNOSIS — R2689 Other abnormalities of gait and mobility: Secondary | ICD-10-CM | POA: Diagnosis present

## 2017-01-12 DIAGNOSIS — M6249 Contracture of muscle, multiple sites: Secondary | ICD-10-CM | POA: Insufficient documentation

## 2017-01-12 DIAGNOSIS — R2681 Unsteadiness on feet: Secondary | ICD-10-CM | POA: Diagnosis present

## 2017-01-12 NOTE — Therapy (Signed)
[facility] [address] [place], [place], [address] Phone: [phone]   Fax:  [phone]  Physical Therapy Treatment  Patient Details  Name: Todd [name] MRN: [id number] Date of Birth: 1955/07/11 Referring Provider: [name], MD  Encounter Date: 01/12/2017      PT End of Session - 01/12/17 1923    Visit Number 4   Number of Visits 35   Date for PT Re-Evaluation 04/28/17   Authorization Type BCBS   PT Start Time 1015   PT Stop Time 1100   PT Time Calculation (min) 45 min   Equipment Utilized During Treatment Gait belt   Activity Tolerance Patient tolerated treatment well   Behavior During Therapy [facility] for tasks assessed/performed      Past Medical History:  Diagnosis Date  . AKI (acute kidney injury) ([place])   . Constipated   . Diabetes mellitus without complication ([place])   . Diarrhea   . Elevated LFTs   . Goiter   . Gout   . Hyperlipidemia   . Hypertension   . Leukocytosis   . Reactive thrombocytosis   . Right BKA infection ([place]) 08/2016  . Right leg pain   . Sepsis due to undetermined organism ([place])   . Thyroid disease   . Wound infection after surgery 08/2016    Past Surgical History:  Procedure Laterality Date  . ABDOMINAL AORTOGRAM N/A 08/11/2016   Procedure: Abdominal Aortogram;  Surgeon: Waynetta Sandy, MD;  Location: [facility];  Service: Cardiovascular;  Laterality: N/A;  . ABDOMINAL AORTOGRAM W/LOWER EXTREMITY N/A 08/15/2016   Procedure: Abdominal Aortogram w/Lower Extremity;  Surgeon: Elam Dutch, MD;  Location: [facility];  Service: Cardiovascular;  Laterality: N/A;  . AMPUTATION Right 08/17/2016   Procedure: RIGHT BELOW KNEE AMPUTATION;  Surgeon: Elam Dutch, MD;  Location: [facility];  Service: Vascular;  Laterality: Right;  . AMPUTATION Right 09/12/2016   Procedure: AMPUTATION ABOVE KNEE;  Surgeon: Newt Minion, MD;  Location: [facility];  Service: Orthopedics;   Laterality: Right;  . AMPUTATION Left 08/12/2016   Procedure: LEFT BELOW KNEE AMPUTATION;  Surgeon: Newt Minion, MD;  Location: [facility];  Service: Orthopedics;  Laterality: Left;  . APPLICATION OF WOUND VAC Right 09/12/2016   Procedure: APPLICATION OF WOUND VAC ABOVE KNEE;  Surgeon: Newt Minion, MD;  Location: [facility];  Service: Orthopedics;  Laterality: Right;  . LOWER EXTREMITY ANGIOGRAPHY Bilateral 08/11/2016   Procedure: Lower Extremity Angiography;  Surgeon: Waynetta Sandy, MD;  Location: [facility];  Service: Cardiovascular;  Laterality: Bilateral;  . PERIPHERAL VASCULAR BALLOON ANGIOPLASTY Left 08/11/2016   Procedure: Peripheral Vascular Balloon Angioplasty;  Surgeon: Waynetta Sandy, MD;  Location: [facility];  Service: Cardiovascular;  Laterality: Left;  SFA  . THYROID SURGERY      There were no vitals filed for this visit.      Subjective Assessment - 01/12/17 1015    Subjective He has been wearing TTA prosthesis and TFA liner 3 hrs 2x/day as advised by PTA last session.    Patient is accompained by: Family member   Pertinent History R  TFA 09/12/2016, L TTA 08/12/2016, gout, HTN, DM, Neuropathy, renal failure   Limitations Lifting;Standing;Walking;House hold activities   Patient Stated Goals Use prostheses to get back into community. Get out of w/c.   Currently in Pain? No/denies  [facility] Adult PT Treatment/Exercise - 01/12/17 1015      Transfers   Transfers Sit to Stand;Stand to Sit   Sit to Stand 3: Mod assist;4: Min assist;4: Min guard;With upper extremity assist;With armrests;From chair/3-in-1  to sink, initially [place] progressed to [employer] to Stand Details Tactile cues for weight shifting;Tactile cues for sequencing;Visual cues/gestures for sequencing;Visual cues for safe use of DME/AE;Verbal cues for sequencing;Verbal cues for technique;Manual facilitation for weight shifting   Sit to Stand Details  (indicate cue type and reason) PT demo, verbal & manual cues on technique with TTA & TFA prostheses including wt shift.  Multiple (>10 reps).  PT issued handout with steps to follow.    Stand to Sit 4: Min assist;4: Min guard;5: Supervision;With upper extremity assist;With armrests;To chair/3-in-1  from sink with TTA/TFA prostheses   Stand to Sit Details (indicate cue type and reason) Tactile cues for sequencing;Tactile cues for weight shifting;Visual cues for safe use of DME/AE;Visual cues/gestures for sequencing;Verbal cues for technique;Manual facilitation for weight shifting;Verbal cues for safe use of DME/AE   Stand to Sit Details PT demo, verbal & manual cues on proper technique with his prostheses. How his TFA knee works and why need to take weight off to enable knee flexion to sit.      Prosthetics   Prosthetic Care Comments  Need for right w/c footrest when wearing TFA prosthesis for safety.    Current prosthetic wear tolerance (days/week)  daily with liners and BKA prosthesis   Current prosthetic wear tolerance (#hours/day)  3 hrs 2x/day for TTA prosthesis & TFA liner   Edema TFA pitting edema,  TTA non-pitting edema   Residual limb condition  BLEs intact but dry skin. PT reviewed use of lotion.    Education Provided Skin check;Residual limb care;Proper Donning;Prosthetic cleaning;Proper Doffing;Proper wear schedule/adjustment   Person(s) Educated Patient;Other (comment)  Uncle   Education Method Explanation;Demonstration;Tactile cues;Verbal cues   Education Method Verbalized understanding;Returned demonstration;Tactile cues required;Verbal cues required;Needs further instruction   Donning Prosthesis Supervision;Minimal assist  TTA SBA & TFA MinA   Doffing Prosthesis Supervision                PT Education - 01/12/17 1055    Education provided Yes   Education Details Sit to/from stand technique. Read direction / handout 5-10 times 3-5 times per day to get technique set in  his mind.    Person(s) Educated Patient   Methods Explanation;Verbal cues;Handout;Demonstration;Tactile cues   Comprehension Verbalized understanding;Returned demonstration;Verbal cues required;Tactile cues required;Need further instruction          PT Short Term Goals - 01/12/17 1923      PT SHORT TERM GOAL #1   Title Patient donnes TTA prosthesis modified independent and TFA prosthesis with minA. (Target Date: 01/27/2017)   Time 1   Period Months   Status On-going     PT SHORT TERM GOAL #2   Title Patient tolerates wear of TTA prosthesis >8 hrs total /day & TFA prosthesis >2 hrs /day without skin issues. (Target Date: 01/27/2017)   Time 1   Period Months   Status On-going     PT SHORT TERM GOAL #3   Title Sit to /from stand w/c to RW with minA with prostheses. (Target Date: 01/27/2017)   Time 1   Period Months   Status On-going     PT SHORT TERM GOAL #4   Title Patient reaches 5" anteriorly with RW support with prostheses with supervision.  (Target  Date: 01/27/2017)   Time 1   Period Months   Status On-going     PT SHORT TERM GOAL #5   Title Patient ambulates 58' with RW & prostheses with modA.  (Target Date: 01/27/2017)   Time 1   Period Months   Status On-going           PT Long Term Goals - 01/12/17 1924      PT LONG TERM GOAL #1   Title Patient verbalizes and demonstrates understanding of prosthetic care to enable safe use of prostheses.  (Target Date: 04/28/2017)   Time 4   Period Months   Status On-going     PT LONG TERM GOAL #2   Title Patient tolerates wear of TTA prosthesis >90% and TFA >75% of awake hours to enable function throughout his day. (Target Date: 04/28/2017)   Time 4   Period Months   Status On-going     PT LONG TERM GOAL #3   Title Patient performs transfers including sit to/from stand and stand pivot transfers with LRAD & prostheses modified independent.  (Target Date: 04/28/2017)   Time 4   Period Months   Status On-going     PT  LONG TERM GOAL #4   Title Patient performs standing balance with LRAD & prostheses reaching 10" anteriorly, reaching to floor, looking over shoulders and managing clothes modified independent.  (Target Date: 04/28/2017)   Time 4   Period Months   Status On-going     PT LONG TERM GOAL #5   Title Patient ambulates 300' with LRAD & prostheses modified independent to enable community mobility.  (Target Date: 04/28/2017)   Time 4   Period Months   Status On-going     PT LONG TERM GOAL #6   Title Patient negotiates ramps, curbs & stairs with LRAD & prostheses modified independent to enable community access.  (Target Date: 04/28/2017)   Time 4   Period Months   Status On-going     PT LONG TERM GOAL #7   Title Activities of Balance Confidence score >15%.  (Target Date: 04/28/2017)   Baseline Initial ABC 0.0%   Time 4   Period Months   Status On-going               Plan - 01/12/17 1924    Clinical Impression Statement Patient improved his ability to sit to/from stand from w/c to sink with multiple reps and skilled instruction in technique. Patient appears can attempt to stand with family using a belt.    Rehab Potential Good   PT Frequency 2x / week   PT Duration Other (comment)  4 months   PT Treatment/Interventions ADLs/Self Care Home Management;DME Instruction;Gait training;Stair training;Functional mobility training;Therapeutic activities;Therapeutic exercise;Balance training;Neuromuscular re-education;Patient/family education;Prosthetic Training;Manual techniques   PT Next Visit Plan Check sit to /from stand to sink, HEP at sink for weight shift & balance.    Consulted and Agree with Plan of Care Patient      Patient will benefit from skilled therapeutic intervention in order to improve the following deficits and impairments:  Abnormal gait, Decreased activity tolerance, Decreased balance, Decreased endurance, Decreased knowledge of use of DME, Decreased mobility, Decreased  range of motion, Decreased skin integrity, Decreased strength, Postural dysfunction, Prosthetic Dependency, Pain  Visit Diagnosis: Other abnormalities of gait and mobility  Unsteadiness on feet  Muscle weakness (generalized)     Problem List Patient Active Problem List   Diagnosis Date Noted  . History of right above knee  amputation ([place]) 09/21/2016  . Acquired absence of left leg below knee ([place]) 09/21/2016  . Transaminitis   . Diabetic peripheral neuropathy ([place]) 07/21/2016  . Renal failure 07/04/2016  . Gout 07/04/2016  . Hyperuricemia 06/21/2016  . HTN (hypertension) 01/05/2016  . Diabetes type 2, uncontrolled ([place]) 01/05/2016  . Hypothyroidism 05/10/2011    Chelise Hanger PT, DPT 01/12/2017, 7:27 PM  [facility] [address] [place], [place], [address] Phone: [phone]   Fax:  [phone]  Name: Todd [name] MRN: [id number] Date of Birth: [birth year]

## 2017-01-12 NOTE — Patient Instructions (Signed)
Make sure your wheelchair is ~18" back from sink. If you sit your left foot on floor, you should have 4-5" in front & back. Your right hand should be able to reach to sink but is a small stretch.   Standing Up:  1. Scoot your bottom to front of chair so can not see any chair between your legs.  2. Check your feet.Right foot on heel and leftt foot back as close to chair as possible.  3. Left hand on chair and right hand on sink or walker.  4. Push on left leg and hands FORWARD to look in sink first. Then straighten back.  5. Keep right heel in contact with floor. Once you are over your feet pull back on right leg to straighten the knee.    Sitting Down: 1. Move left leg back close to chair.  2. Shift your weight on left leg and take all the weight off right leg. Bend right knee.  3. Reach left hand to chair. Keep lifting right leg up so no weight on it.  4. Sit back in the chair.

## 2017-01-17 ENCOUNTER — Encounter: Payer: Self-pay | Admitting: Physical Therapy

## 2017-01-17 ENCOUNTER — Ambulatory Visit: Payer: BLUE CROSS/BLUE SHIELD | Admitting: Physical Therapy

## 2017-01-17 DIAGNOSIS — M6281 Muscle weakness (generalized): Secondary | ICD-10-CM

## 2017-01-17 DIAGNOSIS — R2689 Other abnormalities of gait and mobility: Secondary | ICD-10-CM

## 2017-01-17 DIAGNOSIS — R2681 Unsteadiness on feet: Secondary | ICD-10-CM

## 2017-01-17 NOTE — Therapy (Signed)
[facility] [address] [place], [place], [address] Phone: [phone]   Fax:  [phone]  Physical Therapy Treatment  Patient Details  Name: Todd [name] MRN: [id number] Date of Birth: 01/27/1956 Referring Provider: [name], MD  Encounter Date: 01/17/2017      PT End of Session - 01/17/17 0937    Visit Number 5   Number of Visits 35   Date for PT Re-Evaluation 04/28/17   Authorization Type BCBS   PT Start Time 0933   PT Stop Time 1015   PT Time Calculation (min) 42 min   Equipment Utilized During Treatment Gait belt   Activity Tolerance Patient tolerated treatment well   Behavior During Therapy Todd [name] for tasks assessed/performed      Past Medical History:  Diagnosis Date  . AKI (acute kidney injury) ([place])   . Constipated   . Diabetes mellitus without complication ([place])   . Diarrhea   . Elevated LFTs   . Goiter   . Gout   . Hyperlipidemia   . Hypertension   . Leukocytosis   . Reactive thrombocytosis   . Right BKA infection ([place]) 08/2016  . Right leg pain   . Sepsis due to undetermined organism ([place])   . Thyroid disease   . Wound infection after surgery 08/2016    Past Surgical History:  Procedure Laterality Date  . ABDOMINAL AORTOGRAM N/A 08/11/2016   Procedure: Abdominal Aortogram;  Surgeon: Waynetta Sandy, MD;  Location: [facility];  Service: Cardiovascular;  Laterality: N/A;  . ABDOMINAL AORTOGRAM W/LOWER EXTREMITY N/A 08/15/2016   Procedure: Abdominal Aortogram w/Lower Extremity;  Surgeon: Elam Dutch, MD;  Location: [facility];  Service: Cardiovascular;  Laterality: N/A;  . AMPUTATION Right 08/17/2016   Procedure: RIGHT BELOW KNEE AMPUTATION;  Surgeon: Elam Dutch, MD;  Location: [facility];  Service: Vascular;  Laterality: Right;  . AMPUTATION Right 09/12/2016   Procedure: AMPUTATION ABOVE KNEE;  Surgeon: Newt Minion, MD;  Location: [facility];  Service: Orthopedics;   Laterality: Right;  . AMPUTATION Left 08/12/2016   Procedure: LEFT BELOW KNEE AMPUTATION;  Surgeon: Newt Minion, MD;  Location: [facility];  Service: Orthopedics;  Laterality: Left;  . APPLICATION OF WOUND VAC Right 09/12/2016   Procedure: APPLICATION OF WOUND VAC ABOVE KNEE;  Surgeon: Newt Minion, MD;  Location: [facility];  Service: Orthopedics;  Laterality: Right;  . LOWER EXTREMITY ANGIOGRAPHY Bilateral 08/11/2016   Procedure: Lower Extremity Angiography;  Surgeon: Waynetta Sandy, MD;  Location: [facility];  Service: Cardiovascular;  Laterality: Bilateral;  . PERIPHERAL VASCULAR BALLOON ANGIOPLASTY Left 08/11/2016   Procedure: Peripheral Vascular Balloon Angioplasty;  Surgeon: Waynetta Sandy, MD;  Location: [facility];  Service: Cardiovascular;  Laterality: Left;  SFA  . THYROID SURGERY      There were no vitals filed for this visit.      Subjective Assessment - 01/17/17 0934    Subjective No new complaints. Saw "some guy" at [employer] (not [place]) and took ~20 steps in the parallel bars. Stated the guy said the prostheses looked good. Reports he developed a knot on his left limb at the tibial crest. Went away after taking prosthesis off. Has not been standing at home due to no help.  Patient is accompained by: Family member   Pertinent History R  TFA 09/12/2016, L TTA 08/12/2016, gout, HTN, DM, Neuropathy, renal failure   Limitations Lifting;Standing;Walking;House hold activities   Patient Stated Goals Use prostheses to get back into community. Get out of w/c.   Currently in Pain? No/denies             [facility] Adult PT Treatment/Exercise - 01/17/17 1002      Transfers   Transfers Sit to Stand;Stand to Sit   Sit to Stand 3: Mod assist;With upper extremity assist;From chair/3-in-1   Sit to Stand Details Tactile cues for weight shifting;Tactile cues for sequencing;Verbal cues for sequencing;Verbal cues for technique;Manual  facilitation for weight shifting;Manual facilitation for weight bearing   Sit to Stand Details (indicate cue type and reason) max assist with 1st stand in parallel bars, then mod assist for next 2 reps. mod assist of 2 people for wheelchair to RW x 2 reps. cues needed each time to scoot to edge of chair, for prosthetic foot placement and for weight shifting.                                Stand to Sit 4: Min assist;3: Mod assist   Stand to Sit Details (indicate cue type and reason) Tactile cues for sequencing;Tactile cues for weight shifting;Verbal cues for technique;Manual facilitation for weight shifting;Verbal cues for safe use of DME/AE   Stand to Sit Details cues for technique to unlock AKA prothesis prior to sitting and to reach back to use arms to controll with sitting down.      Ambulation/Gait   Ambulation/Gait Yes   Ambulation/Gait Assistance 3: Mod assist  2 person assist  with RW   Ambulation/Gait Assistance Details cues needed for sequencing with gait: walker, AKA prosthesis, walker, BKA prosthesis. cues also for posture and step placement, mostly with AKA prothesis. manual assist to ensure AKA prothesis was locked in stance position.    Ambulation Distance (Feet) 10 Feet  x1 in parallel bars, x2 with RW   Assistive device Parallel bars;Prostheses;Rolling walker   Gait Pattern Step-through pattern;Decreased step length - left;Decreased stride length;Decreased stance time - right;Decreased weight shift to right;Right circumduction;Right hip hike;Antalgic;Left flexed knee in stance;Right flexed knee in stance;Wide base of support;Trunk flexed;Poor foot clearance - left   Ambulation Surface Level;Indoor   Pre-Gait Activities standing in parallel bars: lateral weight shifting, progressing to alternating UE raises with emphasis on tall posture and equal LE weight bearing. progressed to gait in parallel bars with min assist with cues on sequencing, weight shifting and posture.                            Prosthetics   Current prosthetic wear tolerance (days/week)  daily with liners and BKA prosthesis   Current prosthetic wear tolerance (#hours/day)  3 hrs 2x/day for TTA prosthesis & TFA liner   Residual limb condition  bil LE's intact with no issues   Education Provided Residual limb care;Proper Donning;Prosthetic cleaning;Proper Doffing;Proper wear schedule/adjustment   Person(s) Educated Patient;Caregiver(s)   Education Method Explanation;Demonstration;Verbal cues   Education Method Verbalized understanding;Verbal cues required;Needs further instruction   Donning Prosthesis Supervision;Minimal assist   Doffing Prosthesis Supervision            PT Short Term Goals - 01/12/17 1923      PT SHORT TERM GOAL #1   Title Patient  donnes TTA prosthesis modified independent and TFA prosthesis with minA. (Target Date: 01/27/2017)   Time 1   Period Months   Status On-going     PT SHORT TERM GOAL #2   Title Patient tolerates wear of TTA prosthesis >8 hrs total /day & TFA prosthesis >2 hrs /day without skin issues. (Target Date: 01/27/2017)   Time 1   Period Months   Status On-going     PT SHORT TERM GOAL #3   Title Sit to /from stand w/c to RW with minA with prostheses. (Target Date: 01/27/2017)   Time 1   Period Months   Status On-going     PT SHORT TERM GOAL #4   Title Patient reaches 5" anteriorly with RW support with prostheses with supervision.  (Target Date: 01/27/2017)   Time 1   Period Months   Status On-going     PT SHORT TERM GOAL #5   Title Patient ambulates 75' with RW & prostheses with modA.  (Target Date: 01/27/2017)   Time 1   Period Months   Status On-going           PT Long Term Goals - 01/12/17 1924      PT LONG TERM GOAL #1   Title Patient verbalizes and demonstrates understanding of prosthetic care to enable safe use of prostheses.  (Target Date: 04/28/2017)   Time 4   Period Months   Status On-going     PT LONG TERM GOAL #2   Title Patient  tolerates wear of TTA prosthesis >90% and TFA >75% of awake hours to enable function throughout his day. (Target Date: 04/28/2017)   Time 4   Period Months   Status On-going     PT LONG TERM GOAL #3   Title Patient performs transfers including sit to/from stand and stand pivot transfers with LRAD & prostheses modified independent.  (Target Date: 04/28/2017)   Time 4   Period Months   Status On-going     PT LONG TERM GOAL #4   Title Patient performs standing balance with LRAD & prostheses reaching 10" anteriorly, reaching to floor, looking over shoulders and managing clothes modified independent.  (Target Date: 04/28/2017)   Time 4   Period Months   Status On-going     PT LONG TERM GOAL #5   Title Patient ambulates 300' with LRAD & prostheses modified independent to enable community mobility.  (Target Date: 04/28/2017)   Time 4   Period Months   Status On-going     PT LONG TERM GOAL #6   Title Patient negotiates ramps, curbs & stairs with LRAD & prostheses modified independent to enable community access.  (Target Date: 04/28/2017)   Time 4   Period Months   Status On-going     PT LONG TERM GOAL #7   Title Activities of Balance Confidence score >15%.  (Target Date: 04/28/2017)   Baseline Initial ABC 0.0%   Time 4   Period Months   Status On-going               Plan - 01/17/17 2038    Clinical Impression Statement Today's skilled session focused on transfers and gait with bil prostheses/RW/parallel bars. Pt is progressing and should benefit from continued PT to progress toward unmet goals.   Rehab Potential Good   PT Frequency 2x / week   PT Duration Other (comment)  4 months   PT Treatment/Interventions ADLs/Self Care Home Management;DME Instruction;Gait training;Stair training;Functional mobility training;Therapeutic activities;Therapeutic exercise;Balance training;Neuromuscular re-education;Patient/family  education;Prosthetic Training;Manual techniques   PT Next  Visit Plan Check sit to /from stand to sink, HEP at sink for weight shift & balance once pt is able to stand without assistance as he does not have physical assistance at home   Consulted and Agree with Plan of Care Patient      Patient will benefit from skilled therapeutic intervention in order to improve the following deficits and impairments:  Abnormal gait, Decreased activity tolerance, Decreased balance, Decreased endurance, Decreased knowledge of use of DME, Decreased mobility, Decreased range of motion, Decreased skin integrity, Decreased strength, Postural dysfunction, Prosthetic Dependency, Pain  Visit Diagnosis: Other abnormalities of gait and mobility  Unsteadiness on feet  Muscle weakness (generalized)     Problem List Patient Active Problem List   Diagnosis Date Noted  . History of right above knee amputation ([place]) 09/21/2016  . Acquired absence of left leg below knee ([place]) 09/21/2016  . Transaminitis   . Diabetic peripheral neuropathy ([place]) 07/21/2016  . Renal failure 07/04/2016  . Gout 07/04/2016  . Hyperuricemia 06/21/2016  . HTN (hypertension) 01/05/2016  . Diabetes type 2, uncontrolled ([place]) 01/05/2016  . Hypothyroidism 05/10/2011    Willow Ora, PTA, [facility] [address] [place], [place] [address] [phone] 01/17/17, 8:50 PM   Name: Todd [name] MRN: [id number] Date of Birth: [birth year]

## 2017-01-19 ENCOUNTER — Encounter: Payer: Self-pay | Admitting: Physical Therapy

## 2017-01-19 ENCOUNTER — Ambulatory Visit: Payer: BLUE CROSS/BLUE SHIELD | Admitting: Physical Therapy

## 2017-01-19 DIAGNOSIS — R2689 Other abnormalities of gait and mobility: Secondary | ICD-10-CM | POA: Diagnosis not present

## 2017-01-19 DIAGNOSIS — R2681 Unsteadiness on feet: Secondary | ICD-10-CM

## 2017-01-19 DIAGNOSIS — M6281 Muscle weakness (generalized): Secondary | ICD-10-CM

## 2017-01-19 NOTE — Therapy (Signed)
[facility] [address] [place], [place], [address] Phone: [phone]   Fax:  [phone]  Physical Therapy Treatment  Patient Details  Name: Todd [name] MRN: [id number] Date of Birth: 09/21/1955 Referring Provider: [name], MD  Encounter Date: 01/19/2017      PT End of Session - 01/19/17 1033    Visit Number 6   Number of Visits 35   Date for PT Re-Evaluation 04/28/17   Authorization Type BCBS   PT Start Time 0933   PT Stop Time 1032   PT Time Calculation (min) 59 min   Equipment Utilized During Treatment Gait belt   Activity Tolerance Patient limited by pain  Patient report pain at "knot"   Behavior During Therapy [facility] for tasks assessed/performed      Past Medical History:  Diagnosis Date  . AKI (acute kidney injury) ([place])   . Constipated   . Diabetes mellitus without complication ([place])   . Diarrhea   . Elevated LFTs   . Goiter   . Gout   . Hyperlipidemia   . Hypertension   . Leukocytosis   . Reactive thrombocytosis   . Right BKA infection ([place]) 08/2016  . Right leg pain   . Sepsis due to undetermined organism ([place])   . Thyroid disease   . Wound infection after surgery 08/2016    Past Surgical History:  Procedure Laterality Date  . ABDOMINAL AORTOGRAM N/A 08/11/2016   Procedure: Abdominal Aortogram;  Surgeon: Waynetta Sandy, MD;  Location: [facility];  Service: Cardiovascular;  Laterality: N/A;  . ABDOMINAL AORTOGRAM W/LOWER EXTREMITY N/A 08/15/2016   Procedure: Abdominal Aortogram w/Lower Extremity;  Surgeon: Elam Dutch, MD;  Location: [facility];  Service: Cardiovascular;  Laterality: N/A;  . AMPUTATION Right 08/17/2016   Procedure: RIGHT BELOW KNEE AMPUTATION;  Surgeon: Elam Dutch, MD;  Location: [facility];  Service: Vascular;  Laterality: Right;  . AMPUTATION Right 09/12/2016   Procedure: AMPUTATION ABOVE KNEE;  Surgeon: Newt Minion, MD;  Location: [facility];   Service: Orthopedics;  Laterality: Right;  . AMPUTATION Left 08/12/2016   Procedure: LEFT BELOW KNEE AMPUTATION;  Surgeon: Newt Minion, MD;  Location: [facility];  Service: Orthopedics;  Laterality: Left;  . APPLICATION OF WOUND VAC Right 09/12/2016   Procedure: APPLICATION OF WOUND VAC ABOVE KNEE;  Surgeon: Newt Minion, MD;  Location: [facility];  Service: Orthopedics;  Laterality: Right;  . LOWER EXTREMITY ANGIOGRAPHY Bilateral 08/11/2016   Procedure: Lower Extremity Angiography;  Surgeon: Waynetta Sandy, MD;  Location: [facility];  Service: Cardiovascular;  Laterality: Bilateral;  . PERIPHERAL VASCULAR BALLOON ANGIOPLASTY Left 08/11/2016   Procedure: Peripheral Vascular Balloon Angioplasty;  Surgeon: Waynetta Sandy, MD;  Location: [facility];  Service: Cardiovascular;  Laterality: Left;  SFA  . THYROID SURGERY      There were no vitals filed for this visit.      Subjective Assessment - 01/19/17 0944    Subjective No reports of pain today. Does report knot on tibial crest of L LE that prevents wearing prosthesis for long periods of time. PT scheduled appt with prosthetist.   Patient is accompained by: Family member  uncle Jeneen Rinks   Pertinent History R  TFA 09/12/2016, L TTA 08/12/2016, gout, HTN, DM, Neuropathy, renal failure   Limitations Lifting;Standing;Walking;House hold activities   Patient Stated Goals Use prostheses to get back into community. Get out of w/c.   Currently  in Pain? No/denies                         [facility] Adult PT Treatment/Exercise - 01/19/17 1039      Transfers   Transfers Sit to Stand;Stand to Sit;Anterior-Posterior Transfer   Sit to Stand 2: Max assist;3: Mod assist;With upper extremity assist;With armrests;From chair/3-in-1  to sink, pulled on sink    Sit to Stand Details Tactile cues for weight shifting;Tactile cues for weight beaing;Visual cues/gestures for sequencing;Verbal cues for sequencing;Verbal cues for  technique;Manual facilitation for weight shifting   Stand to Sit 4: Min assist;With upper extremity assist;To chair/3-in-1;With armrests  from sink   Stand to Sit Details (indicate cue type and reason) Tactile cues for weight shifting;Tactile cues for weight beaing;Verbal cues for sequencing;Verbal cues for technique;Manual facilitation for weight shifting   Anterior-Posterior Transfer 5: Supervision;To level surface  to toilet   Anterior-Posterior Transfer Details (indicate cue type and reason) PT demo/verbal cues on anterior scoot w/c to toilet and posterior toilet to w/c     Therapeutic Activites    Therapeutic Activities ADL's   ADL's PT provided instructions on transferring from wheelchair to toilet to ensure safe transfers in public restrooms. Pt returned demo with verbal cues of therapist.     Prosthetics   Residual limb condition  No open areas. Right TTA bone spur at distal tibia. PT recommended relief in prosthesis to accommodate.    Education Provided Residual limb care;Prosthetic cleaning;Proper Donning;Proper wear schedule/adjustment   Person(s) Educated Patient   Education Method Explanation;Verbal cues   Education Method Verbalized understanding;Verbal cues required;Needs further instruction   Donning Prosthesis Minimal assist;Supervision  MinA TFA & SBA TTA   Doffing Prosthesis Supervision                PT Education - 01/19/17 1032    Education provided Yes   Education Details Prosthetic care, liner cleaning, sit to/from stand technique   Person(s) Educated Patient   Methods Explanation;Demonstration;Tactile cues;Verbal cues   Comprehension Verbalized understanding;Returned demonstration;Verbal cues required          PT Short Term Goals - 01/12/17 1923      PT SHORT TERM GOAL #1   Title Patient donnes TTA prosthesis modified independent and TFA prosthesis with minA. (Target Date: 01/27/2017)   Time 1   Period Months   Status On-going     PT SHORT  TERM GOAL #2   Title Patient tolerates wear of TTA prosthesis >8 hrs total /day & TFA prosthesis >2 hrs /day without skin issues. (Target Date: 01/27/2017)   Time 1   Period Months   Status On-going     PT SHORT TERM GOAL #3   Title Sit to /from stand w/c to RW with minA with prostheses. (Target Date: 01/27/2017)   Time 1   Period Months   Status On-going     PT SHORT TERM GOAL #4   Title Patient reaches 5" anteriorly with RW support with prostheses with supervision.  (Target Date: 01/27/2017)   Time 1   Period Months   Status On-going     PT SHORT TERM GOAL #5   Title Patient ambulates 67' with RW & prostheses with modA.  (Target Date: 01/27/2017)   Time 1   Period Months   Status On-going           PT Long Term Goals - 01/12/17 1924      PT LONG TERM GOAL #1  Title Patient verbalizes and demonstrates understanding of prosthetic care to enable safe use of prostheses.  (Target Date: 04/28/2017)   Time 4   Period Months   Status On-going     PT LONG TERM GOAL #2   Title Patient tolerates wear of TTA prosthesis >90% and TFA >75% of awake hours to enable function throughout his day. (Target Date: 04/28/2017)   Time 4   Period Months   Status On-going     PT LONG TERM GOAL #3   Title Patient performs transfers including sit to/from stand and stand pivot transfers with LRAD & prostheses modified independent.  (Target Date: 04/28/2017)   Time 4   Period Months   Status On-going     PT LONG TERM GOAL #4   Title Patient performs standing balance with LRAD & prostheses reaching 10" anteriorly, reaching to floor, looking over shoulders and managing clothes modified independent.  (Target Date: 04/28/2017)   Time 4   Period Months   Status On-going     PT LONG TERM GOAL #5   Title Patient ambulates 300' with LRAD & prostheses modified independent to enable community mobility.  (Target Date: 04/28/2017)   Time 4   Period Months   Status On-going     PT LONG TERM GOAL #6    Title Patient negotiates ramps, curbs & stairs with LRAD & prostheses modified independent to enable community access.  (Target Date: 04/28/2017)   Time 4   Period Months   Status On-going     PT LONG TERM GOAL #7   Title Activities of Balance Confidence score >15%.  (Target Date: 04/28/2017)   Baseline Initial ABC 0.0%   Time 4   Period Months   Status On-going               Plan - 01/19/17 1041    Clinical Impression Statement Today's session focused on patient education regarding care and cleaning of prosthesis, safe transfers between chair and toilet to ensure ability to use public restrooms, and sit to stand transfers at sink to improve independence. PT provided visual, verbal, manual, tactile and demo cues, and patient returned demo with min to mod A. Patient continues to require skilled therapy to progress toward goals.   Rehab Potential Good   PT Frequency 2x / week   PT Duration Other (comment)  4 months   PT Treatment/Interventions ADLs/Self Care Home Management;DME Instruction;Gait training;Stair training;Functional mobility training;Therapeutic activities;Therapeutic exercise;Balance training;Neuromuscular re-education;Patient/family education;Prosthetic Training;Manual techniques   PT Next Visit Plan Check sit to /from stand to sink, HEP at sink for weight shift & balance once pt is able to stand without assistance as he does not have physical assistance at home   Consulted and Agree with Plan of Care Patient      Patient will benefit from skilled therapeutic intervention in order to improve the following deficits and impairments:  Abnormal gait, Decreased activity tolerance, Decreased balance, Decreased endurance, Decreased knowledge of use of DME, Decreased mobility, Decreased range of motion, Decreased skin integrity, Decreased strength, Postural dysfunction, Prosthetic Dependency, Pain  Visit Diagnosis: Other abnormalities of gait and mobility  Unsteadiness  on feet  Muscle weakness (generalized)     Problem List Patient Active Problem List   Diagnosis Date Noted  . History of right above knee amputation ([place]) 09/21/2016  . Acquired absence of left leg below knee ([place]) 09/21/2016  . Transaminitis   . Diabetic peripheral neuropathy ([place]) 07/21/2016  . Renal failure 07/04/2016  . Gout  07/04/2016  . Hyperuricemia 06/21/2016  . HTN (hypertension) 01/05/2016  . Diabetes type 2, uncontrolled ([place]) 01/05/2016  . Hypothyroidism 05/10/2011   Gershon Crane, SPT 01/19/2017, 10:45 AM  Jamey Reas, PT, DPT 01/19/2017, 12:17 PM  [facility] [address] [place], [place], [address] Phone: [phone]   Fax:  [phone]  Name: Todd [name] MRN: [id number] Date of Birth: Sep 19, 1955

## 2017-01-24 ENCOUNTER — Encounter: Payer: Self-pay | Admitting: Physical Therapy

## 2017-01-24 ENCOUNTER — Ambulatory Visit: Payer: BLUE CROSS/BLUE SHIELD | Admitting: Physical Therapy

## 2017-01-24 DIAGNOSIS — M6281 Muscle weakness (generalized): Secondary | ICD-10-CM

## 2017-01-24 DIAGNOSIS — R2689 Other abnormalities of gait and mobility: Secondary | ICD-10-CM

## 2017-01-24 DIAGNOSIS — M6249 Contracture of muscle, multiple sites: Secondary | ICD-10-CM

## 2017-01-24 DIAGNOSIS — R2681 Unsteadiness on feet: Secondary | ICD-10-CM

## 2017-01-25 NOTE — Therapy (Signed)
[facility] [address] [place], [place], [address] Phone: [phone]   Fax:  [phone]  Physical Therapy Treatment  Patient Details  Name: Todd [name] MRN: [id number] Date of Birth: 1955-08-22 Referring Provider: [name], MD  Encounter Date: 01/24/2017      PT End of Session - 01/24/17 1726    Visit Number 7   Number of Visits 35   Date for PT Re-Evaluation 04/28/17   Authorization Type BCBS   PT Start Time 1445   PT Stop Time 1530   PT Time Calculation (min) 45 min   Equipment Utilized During Treatment Gait belt   Activity Tolerance Patient limited by pain  Patient report pain at "knot"   Behavior During Therapy [facility] for tasks assessed/performed      Past Medical History:  Diagnosis Date  . AKI (acute kidney injury) ([place])   . Constipated   . Diabetes mellitus without complication ([place])   . Diarrhea   . Elevated LFTs   . Goiter   . Gout   . Hyperlipidemia   . Hypertension   . Leukocytosis   . Reactive thrombocytosis   . Right BKA infection ([place]) 08/2016  . Right leg pain   . Sepsis due to undetermined organism ([place])   . Thyroid disease   . Wound infection after surgery 08/2016    Past Surgical History:  Procedure Laterality Date  . ABDOMINAL AORTOGRAM N/A 08/11/2016   Procedure: Abdominal Aortogram;  Surgeon: Waynetta Sandy, MD;  Location: [facility];  Service: Cardiovascular;  Laterality: N/A;  . ABDOMINAL AORTOGRAM W/LOWER EXTREMITY N/A 08/15/2016   Procedure: Abdominal Aortogram w/Lower Extremity;  Surgeon: Elam Dutch, MD;  Location: [facility];  Service: Cardiovascular;  Laterality: N/A;  . AMPUTATION Right 08/17/2016   Procedure: RIGHT BELOW KNEE AMPUTATION;  Surgeon: Elam Dutch, MD;  Location: [facility];  Service: Vascular;  Laterality: Right;  . AMPUTATION Right 09/12/2016   Procedure: AMPUTATION ABOVE KNEE;  Surgeon: Newt Minion, MD;  Location: [facility];  Service: Orthopedics;  Laterality: Right;  . AMPUTATION Left 08/12/2016   Procedure: LEFT BELOW KNEE AMPUTATION;  Surgeon: Newt Minion, MD;  Location: [facility];  Service: Orthopedics;  Laterality: Left;  . APPLICATION OF WOUND VAC Right 09/12/2016   Procedure: APPLICATION OF WOUND VAC ABOVE KNEE;  Surgeon: Newt Minion, MD;  Location: [place] [facility];  Service: Orthopedics;  Laterality: Right;  . LOWER EXTREMITY ANGIOGRAPHY Bilateral 08/11/2016   Procedure: Lower Extremity Angiography;  Surgeon: Waynetta Sandy, MD;  Location: [facility];  Service: Cardiovascular;  Laterality: Bilateral;  . PERIPHERAL VASCULAR BALLOON ANGIOPLASTY Left 08/11/2016   Procedure: Peripheral Vascular Balloon Angioplasty;  Surgeon: Waynetta Sandy, MD;  Location: [facility];  Service: Cardiovascular;  Laterality: Left;  SFA  . THYROID SURGERY      There were no vitals filed for this visit.      Subjective Assessment - 01/24/17 1451    Subjective (P)  Prosthetist added pads in BKA prosthesis just prior to PT. He has only worn it twice as distal tibia has been hurting.    Patient is accompained by: (P)  Family member   Pertinent History (P)  R  TFA 09/12/2016, L TTA 08/12/2016, gout, HTN, DM, Neuropathy, renal failure   Limitations (P)  Lifting;Standing;Walking;House hold activities   Patient Stated Goals (P)  Use prostheses to get back into community. Get out of w/c.  Currently in Pain? (P)  Yes   Pain Score (P)  5    Pain Location (P)  Leg  limb   Pain Orientation (P)  Left;Distal;Other (Comment)  tibia   Pain Descriptors / Indicators (P)  Sore   Pain Type (P)  Acute pain   Pain Onset (P)  1 to 4 weeks ago   Pain Frequency (P)  Intermittent   Aggravating Factors  (P)  pressure from prosthesis   Pain Relieving Factors (P)  removing prosthesis   Effect of Pain on Daily Activities (P)  limits prosthesis wear   Multiple Pain Sites (P)  No                         [facility]  Adult PT Treatment/Exercise - 01/24/17 1445      Transfers   Transfers Sit to Stand;Stand to Sit;Anterior-Posterior Transfer   Sit to Stand 3: Mod assist;With upper extremity assist;With armrests;From chair/3-in-1;2: Max assist  to sink, pulled on sink 1st 2 times, then progressed to push   Sit to Stand Details Tactile cues for weight shifting;Tactile cues for weight beaing;Visual cues/gestures for sequencing;Verbal cues for sequencing;Verbal cues for technique;Manual facilitation for weight shifting   Sit to Stand Details (indicate cue type and reason) Verbal & manual cues on technique. Started at sink: initial 2 times pulling on sink then progressed to LUE on sink pushing & RUE on w/c pushing.   Sit to stand to RW with max A & verbal cues.    Stand to Sit 4: Min assist;With upper extremity assist;To chair/3-in-1;With armrests;4: Min guard  from sink, from RW   Stand to Sit Details (indicate cue type and reason) Tactile cues for weight shifting;Tactile cues for weight beaing;Verbal cues for sequencing;Verbal cues for technique;Manual facilitation for weight shifting     Ambulation/Gait   Ambulation/Gait Yes   Ambulation/Gait Assistance 3: Mod assist  2 people for safety.   Ambulation/Gait Assistance Details verbal cues on sequence RW, RLE, RW, LLE.... to facilitate positioning of RW for support & upright posture and proper wt shift over prosthesis in stance.    Ambulation Distance (Feet) 30 Feet  35' & 30'   Assistive device Rolling walker;Prostheses  R TFA & L TTA prostheses   Ambulation Surface Indoor;Level     Therapeutic Activites    Therapeutic Activities ADL's   ADL's PT provided instructions on transferring from wheelchair to toilet to ensure safe transfers in public restrooms. Pt returned demo with verbal cues of therapist.     Prosthetics   Residual limb condition  No open areas. Right TTA bone spur at distal tibia. PT recommended relief in prosthesis to accommodate.     Education Provided Residual limb care;Prosthetic cleaning;Proper Donning;Proper wear schedule/adjustment                  PT Short Term Goals - 01/24/17 1727      PT SHORT TERM GOAL #1   Title Patient donnes TTA prosthesis modified independent and TFA prosthesis with minA. (Target Date: 01/27/2017)   Baseline MET 01/24/2017   Time 1   Period Months   Status Achieved     PT SHORT TERM GOAL #2   Title Patient tolerates wear of TTA prosthesis >8 hrs total /day & TFA prosthesis >2 hrs /day without skin issues. (Target Date: 01/27/2017)   Time 1   Period Months   Status On-going     PT SHORT TERM GOAL #3  Title Sit to /from stand w/c to RW with minA with prostheses. (Target Date: 01/27/2017)   Time 1   Period Months   Status On-going     PT SHORT TERM GOAL #4   Title Patient reaches 5" anteriorly with RW support with prostheses with supervision.  (Target Date: 01/27/2017)   Time 1   Period Months   Status On-going     PT SHORT TERM GOAL #5   Title Patient ambulates 55' with RW & prostheses with modA.  (Target Date: 01/27/2017)   Time 1   Period Months   Status On-going           PT Long Term Goals - 01/12/17 1924      PT LONG TERM GOAL #1   Title Patient verbalizes and demonstrates understanding of prosthetic care to enable safe use of prostheses.  (Target Date: 04/28/2017)   Time 4   Period Months   Status On-going     PT LONG TERM GOAL #2   Title Patient tolerates wear of TTA prosthesis >90% and TFA >75% of awake hours to enable function throughout his day. (Target Date: 04/28/2017)   Time 4   Period Months   Status On-going     PT LONG TERM GOAL #3   Title Patient performs transfers including sit to/from stand and stand pivot transfers with LRAD & prostheses modified independent.  (Target Date: 04/28/2017)   Time 4   Period Months   Status On-going     PT LONG TERM GOAL #4   Title Patient performs standing balance with LRAD & prostheses reaching 10"  anteriorly, reaching to floor, looking over shoulders and managing clothes modified independent.  (Target Date: 04/28/2017)   Time 4   Period Months   Status On-going     PT LONG TERM GOAL #5   Title Patient ambulates 300' with LRAD & prostheses modified independent to enable community mobility.  (Target Date: 04/28/2017)   Time 4   Period Months   Status On-going     PT LONG TERM GOAL #6   Title Patient negotiates ramps, curbs & stairs with LRAD & prostheses modified independent to enable community access.  (Target Date: 04/28/2017)   Time 4   Period Months   Status On-going     PT LONG TERM GOAL #7   Title Activities of Balance Confidence score >15%.  (Target Date: 04/28/2017)   Baseline Initial ABC 0.0%   Time 4   Period Months   Status On-going               Plan - 01/24/17 1728    Clinical Impression Statement Patient improved sit to/from stand with skilled instruction in technique. Patient was able to progress to gait and with instruction in proper sequence to keep RW close for upright trunk and weight bear directly over prostheses in stance improved pain / discomfort in TTA limb.    Rehab Potential Good   PT Frequency 2x / week   PT Duration Other (comment)  4 months   PT Treatment/Interventions ADLs/Self Care Home Management;DME Instruction;Gait training;Stair training;Functional mobility training;Therapeutic activities;Therapeutic exercise;Balance training;Neuromuscular re-education;Patient/family education;Prosthetic Training;Manual techniques   PT Next Visit Plan Check remaining STGs, HEP at sink for weight shift & balance once pt is able to stand without assistance as he does not have physical assistance at home   Consulted and Agree with Plan of Care Patient      Patient will benefit from skilled therapeutic intervention in order to  improve the following deficits and impairments:  Abnormal gait, Decreased activity tolerance, Decreased balance, Decreased  endurance, Decreased knowledge of use of DME, Decreased mobility, Decreased range of motion, Decreased skin integrity, Decreased strength, Postural dysfunction, Prosthetic Dependency, Pain  Visit Diagnosis: Other abnormalities of gait and mobility  Unsteadiness on feet  Muscle weakness (generalized)  Contracture of muscle, multiple sites     Problem List Patient Active Problem List   Diagnosis Date Noted  . History of right above knee amputation ([place]) 09/21/2016  . Acquired absence of left leg below knee ([place]) 09/21/2016  . Transaminitis   . Diabetic peripheral neuropathy ([place]) 07/21/2016  . Renal failure 07/04/2016  . Gout 07/04/2016  . Hyperuricemia 06/21/2016  . HTN (hypertension) 01/05/2016  . Diabetes type 2, uncontrolled ([place]) 01/05/2016  . Hypothyroidism 05/10/2011    Chasitie Passey PT, DPT 01/25/2017, 5:36 PM  [facility] [address] [place], [place], [address] Phone: [phone]   Fax:  [phone]  Name: Argus Caraher MRN: [id number] Date of Birth: 08-10-1955

## 2017-01-26 ENCOUNTER — Encounter: Payer: Self-pay | Admitting: Physical Therapy

## 2017-01-26 ENCOUNTER — Ambulatory Visit: Payer: BLUE CROSS/BLUE SHIELD | Admitting: Physical Therapy

## 2017-01-26 DIAGNOSIS — M6281 Muscle weakness (generalized): Secondary | ICD-10-CM

## 2017-01-26 DIAGNOSIS — R2681 Unsteadiness on feet: Secondary | ICD-10-CM

## 2017-01-26 DIAGNOSIS — M6249 Contracture of muscle, multiple sites: Secondary | ICD-10-CM

## 2017-01-26 DIAGNOSIS — R2689 Other abnormalities of gait and mobility: Secondary | ICD-10-CM | POA: Diagnosis not present

## 2017-01-27 NOTE — Therapy (Signed)
[facility] [address] [place], [place], [address] Phone: [phone]   Fax:  [phone]  Physical Therapy Treatment  Patient Details  Name: Todd [name] MRN: [id number] Date of Birth: 06-20-1955 Referring Provider: [name], MD  Encounter Date: 01/26/2017      PT End of Session - 01/26/17 1445    Visit Number 8   Number of Visits 35   Date for PT Re-Evaluation 04/28/17   Authorization Type BCBS   PT Start Time 1015   PT Stop Time 1100   PT Time Calculation (min) 45 min   Equipment Utilized During Treatment Gait belt   Activity Tolerance Patient limited by pain  Patient report pain at "knot"   Behavior During Therapy [facility] for tasks assessed/performed      Past Medical History:  Diagnosis Date  . AKI (acute kidney injury) ([place])   . Constipated   . Diabetes mellitus without complication ([place])   . Diarrhea   . Elevated LFTs   . Goiter   . Gout   . Hyperlipidemia   . Hypertension   . Leukocytosis   . Reactive thrombocytosis   . Right BKA infection ([place]) 08/2016  . Right leg pain   . Sepsis due to undetermined organism ([place])   . Thyroid disease   . Wound infection after surgery 08/2016    Past Surgical History:  Procedure Laterality Date  . ABDOMINAL AORTOGRAM N/A 08/11/2016   Procedure: Abdominal Aortogram;  Surgeon: Waynetta Sandy, MD;  Location: [facility];  Service: Cardiovascular;  Laterality: N/A;  . ABDOMINAL AORTOGRAM W/LOWER EXTREMITY N/A 08/15/2016   Procedure: Abdominal Aortogram w/Lower Extremity;  Surgeon: Elam Dutch, MD;  Location: [facility];  Service: Cardiovascular;  Laterality: N/A;  . AMPUTATION Right 08/17/2016   Procedure: RIGHT BELOW KNEE AMPUTATION;  Surgeon: Elam Dutch, MD;  Location: [facility];  Service: Vascular;  Laterality: Right;  . AMPUTATION Right 09/12/2016   Procedure: AMPUTATION ABOVE KNEE;  Surgeon: Newt Minion, MD;  Location: [facility];  Service: Orthopedics;  Laterality: Right;  . AMPUTATION Left 08/12/2016   Procedure: LEFT BELOW KNEE AMPUTATION;  Surgeon: Newt Minion, MD;  Location: [facility];  Service: Orthopedics;  Laterality: Left;  . APPLICATION OF WOUND VAC Right 09/12/2016   Procedure: APPLICATION OF WOUND VAC ABOVE KNEE;  Surgeon: Newt Minion, MD;  Location: [facility];  Service: Orthopedics;  Laterality: Right;  . LOWER EXTREMITY ANGIOGRAPHY Bilateral 08/11/2016   Procedure: Lower Extremity Angiography;  Surgeon: Waynetta Sandy, MD;  Location: [facility];  Service: Cardiovascular;  Laterality: Bilateral;  . PERIPHERAL VASCULAR BALLOON ANGIOPLASTY Left 08/11/2016   Procedure: Peripheral Vascular Balloon Angioplasty;  Surgeon: Waynetta Sandy, MD;  Location: [facility];  Service: Cardiovascular;  Laterality: Left;  SFA  . THYROID SURGERY      There were no vitals filed for this visit.      Subjective Assessment - 01/26/17 1017    Subjective He wore left TTA prosthesis 2 hrs 2x/day with minimal pain.    Patient is accompained by: Family member   Pertinent History R  TFA 09/12/2016, L TTA 08/12/2016, gout, HTN, DM, Neuropathy, renal failure   Limitations Lifting;Standing;Walking;House hold activities   Patient Stated Goals Use prostheses to get back into community. Get out of w/c.   Currently in Pain? No/denies  [place] Adult PT Treatment/Exercise - 01/26/17 1015      Transfers   Transfers Sit to Stand;Stand to [employer] Transfers   Sit to Stand 3: Mod assist;With upper extremity assist;With armrests;From chair/3-in-1  To RW   Sit to Stand Details Verbal cues for technique;Manual facilitation for weight shifting;Verbal cues for safe use of DME/AE   Stand to Sit 4: Min assist;With upper extremity assist;With armrests;To chair/3-in-1  from RW   Stand to Sit Details (indicate cue type and reason) Verbal cues for technique;Verbal cues for  sequencing;Verbal cues for safe use of DME/AE   Stand to Sit Details verbal cues to unweight TFA prosthesis to unlock prosthetic knee   Stand Pivot Transfers 3: Mod assist;With armrests  with RW   Stand Pivot Transfer Details (indicate cue type and reason) PT demo & verbal cues on technique, foot movement and weight shift     Ambulation/Gait   Ambulation/Gait Yes   Ambulation/Gait Assistance 3: Mod assist   Ambulation/Gait Assistance Details verbal & manual/tactile cues on sequence (RLE, RW, LLE, RW...), initial contact with TFA prosthetic heel to facilitate knee ext and weight shift.   Ambulation Distance (Feet) 47 Feet  16' & 30'   Assistive device Rolling walker;Prostheses  R TFA & L TTA prostheses   Ambulation Surface Level;Indoor     Prosthetics   Prosthetic Care Comments  increase wear to 3-4 hrs 2x/day TTA prosthesis & TFA liner; donne TFA liner to stand at sink with family.    Current prosthetic wear tolerance (days/week)  daily   Current prosthetic wear tolerance (#hours/day)  2 hrs 2x/day TTA prosthesis & TFA liner   Current prosthetic weight-bearing tolerance (hours/day)  Reports no TTA pain with today's activities.    Residual limb condition  No open areas. Right TTA bone spur at distal tibia. PT recommended relief in prosthesis to accommodate.    Education Provided Skin check;Residual limb care;Proper Donning;Proper wear schedule/adjustment   Person(s) Educated Patient   Education Method Verbal cues;Explanation   Education Method Verbalized understanding;Verbal cues required;Needs further instruction   Donning Prosthesis Supervision;Minimal assist  SBA TTA & MinA to tighten TFA suspension strap   Doffing Prosthesis Modified independent (device/increased time)                  PT Short Term Goals - 01/26/17 1445      PT SHORT TERM GOAL #1   Title Patient donnes TTA prosthesis modified independent and TFA prosthesis with minA. (Target Date: 01/27/2017)    Baseline MET 01/24/2017   Time 1   Period Months   Status Achieved     PT SHORT TERM GOAL #2   Title Patient tolerates wear of TTA prosthesis >8 hrs total /day & TFA prosthesis >2 hrs /day without skin issues.    Baseline 01/26/2017  Pt tolerates wear of TTA prosthesis & TFA liner 2 hrs 2x/day. Distal tibia pain reduced over last week.   Continue STG   Time 1   Period Months   Status Not Met   Target Date 02/24/17     PT SHORT TERM GOAL #3   Title Sit to /from stand w/c to RW with minA with prostheses.    Baseline 01/26/2017:  Sit to stand w/c to RW with [place] and Stand to sit with MinA   Time 1   Period Months   Status Partially Met     PT SHORT TERM GOAL #4   Title Patient reaches 5" anteriorly with RW support with  prostheses with supervision.    Baseline 01/26/2017:  Patient reaches 5" anteriorly with RW support with prostheses with minA.  Continue STG   Time 1   Period Months   Status On-going   Target Date 02/24/17     PT SHORT TERM GOAL #5   Title Patient ambulates 48' with RW & prostheses with modA.     Baseline Partially MET 01/26/2017 Pt ambulates 44' with RW & Prostheses with modA (2 people for safety)   Time 1   Period Months   Status Partially Met     Additional Short Term Goals   Additional Short Term Goals Yes     PT SHORT TERM GOAL #6   Title Patient performs stand-pivot transfer with RW & prostheses between 2 chairs with armrest with minA.    Time 4   Period Weeks   Status New     PT SHORT TERM GOAL #7   Title Patient ambulates 68' with RW & prostheses with modA of 1 person.    Time 4   Period Weeks   Status New   Target Date 02/24/17           PT Long Term Goals - 01/12/17 1924      PT LONG TERM GOAL #1   Title Patient verbalizes and demonstrates understanding of prosthetic care to enable safe use of prostheses.  (Target Date: 04/28/2017)   Time 4   Period Months   Status On-going     PT LONG TERM GOAL #2   Title Patient tolerates wear of  TTA prosthesis >90% and TFA >75% of awake hours to enable function throughout his day. (Target Date: 04/28/2017)   Time 4   Period Months   Status On-going     PT LONG TERM GOAL #3   Title Patient performs transfers including sit to/from stand and stand pivot transfers with LRAD & prostheses modified independent.  (Target Date: 04/28/2017)   Time 4   Period Months   Status On-going     PT LONG TERM GOAL #4   Title Patient performs standing balance with LRAD & prostheses reaching 10" anteriorly, reaching to floor, looking over shoulders and managing clothes modified independent.  (Target Date: 04/28/2017)   Time 4   Period Months   Status On-going     PT LONG TERM GOAL #5   Title Patient ambulates 300' with LRAD & prostheses modified independent to enable community mobility.  (Target Date: 04/28/2017)   Time 4   Period Months   Status On-going     PT LONG TERM GOAL #6   Title Patient negotiates ramps, curbs & stairs with LRAD & prostheses modified independent to enable community access.  (Target Date: 04/28/2017)   Time 4   Period Months   Status On-going     PT LONG TERM GOAL #7   Title Activities of Balance Confidence score >15%.  (Target Date: 04/28/2017)   Baseline Initial ABC 0.0%   Time 4   Period Months   Status On-going               Plan - 01/26/17 1454    Clinical Impression Statement Patient is progressing with ability to stand, transfer and ambulate with TFA/TTA prostheses & RW with skilled instruction. Stand-pivot transfer added to treatment today to progress gait to not require w/c to follow but turn 90* to sit in a chair with armrests.    Rehab Potential Good   PT Frequency 2x / week  PT Duration Other (comment)  4 months   PT Treatment/Interventions ADLs/Self Care Home Management;DME Instruction;Gait training;Stair training;Functional mobility training;Therapeutic activities;Therapeutic exercise;Balance training;Neuromuscular  re-education;Patient/family education;Prosthetic Training;Manual techniques   PT Next Visit Plan Prosthetic gait with RW work on turning to sit in a chair with armrests, HEP at sink for weight shift & balance once pt is able to stand without assistance as he does not have physical assistance at home   Consulted and Agree with Plan of Care Patient      Patient will benefit from skilled therapeutic intervention in order to improve the following deficits and impairments:  Abnormal gait, Decreased activity tolerance, Decreased balance, Decreased endurance, Decreased knowledge of use of DME, Decreased mobility, Decreased range of motion, Decreased skin integrity, Decreased strength, Postural dysfunction, Prosthetic Dependency, Pain  Visit Diagnosis: Other abnormalities of gait and mobility  Unsteadiness on feet  Muscle weakness (generalized)  Contracture of muscle, multiple sites     Problem List Patient Active Problem List   Diagnosis Date Noted  . History of right above knee amputation ([place]) 09/21/2016  . Acquired absence of left leg below knee ([place]) 09/21/2016  . Transaminitis   . Diabetic peripheral neuropathy ([place]) 07/21/2016  . Renal failure 07/04/2016  . Gout 07/04/2016  . Hyperuricemia 06/21/2016  . HTN (hypertension) 01/05/2016  . Diabetes type 2, uncontrolled ([place]) 01/05/2016  . Hypothyroidism 05/10/2011    Anise Harbin PT, DPT 01/27/2017, 2:58 PM  [facility] [address] [place], [place], [address] Phone: [phone]   Fax:  [phone]  Name: Mills Mitton MRN: [id number] Date of Birth: [birth year]

## 2017-01-30 ENCOUNTER — Ambulatory Visit: Payer: BLUE CROSS/BLUE SHIELD | Admitting: Physical Therapy

## 2017-01-30 ENCOUNTER — Encounter: Payer: Self-pay | Admitting: Physical Therapy

## 2017-01-30 DIAGNOSIS — M6249 Contracture of muscle, multiple sites: Secondary | ICD-10-CM

## 2017-01-30 DIAGNOSIS — R2689 Other abnormalities of gait and mobility: Secondary | ICD-10-CM | POA: Diagnosis not present

## 2017-01-30 DIAGNOSIS — M6281 Muscle weakness (generalized): Secondary | ICD-10-CM

## 2017-01-30 DIAGNOSIS — R2681 Unsteadiness on feet: Secondary | ICD-10-CM

## 2017-01-30 NOTE — Therapy (Signed)
[facility] [address] [place], [place], [address] Phone: [phone]   Fax:  [phone]  Physical Therapy Treatment  Patient Details  Name: Todd [name] MRN: [id number] Date of Birth: Aug 24, 1955 Referring Provider: [name], MD  Encounter Date: 01/30/2017      PT End of Session - 01/30/17 2316    Visit Number 9   Number of Visits 35   Date for PT Re-Evaluation 04/28/17   Authorization Type BCBS primary & Medicaid secondary   PT Start Time 1015   PT Stop Time 1100   PT Time Calculation (min) 45 min   Equipment Utilized During Treatment Gait belt   Activity Tolerance Patient limited by fatigue  Patient report pain at "knot"   Behavior During Therapy [facility] for tasks assessed/performed      Past Medical History:  Diagnosis Date  . AKI (acute kidney injury) ([place])   . Constipated   . Diabetes mellitus without complication ([place])   . Diarrhea   . Elevated LFTs   . Goiter   . Gout   . Hyperlipidemia   . Hypertension   . Leukocytosis   . Reactive thrombocytosis   . Right BKA infection ([place]) 08/2016  . Right leg pain   . Sepsis due to undetermined organism ([place])   . Thyroid disease   . Wound infection after surgery 08/2016    Past Surgical History:  Procedure Laterality Date  . ABDOMINAL AORTOGRAM N/A 08/11/2016   Procedure: Abdominal Aortogram;  Surgeon: Waynetta Sandy, MD;  Location: [facility];  Service: Cardiovascular;  Laterality: N/A;  . ABDOMINAL AORTOGRAM W/LOWER EXTREMITY N/A 08/15/2016   Procedure: Abdominal Aortogram w/Lower Extremity;  Surgeon: Elam Dutch, MD;  Location: [facility];  Service: Cardiovascular;  Laterality: N/A;  . AMPUTATION Right 08/17/2016   Procedure: RIGHT BELOW KNEE AMPUTATION;  Surgeon: Elam Dutch, MD;  Location: [facility];  Service: Vascular;  Laterality: Right;  . AMPUTATION Right 09/12/2016   Procedure: AMPUTATION ABOVE KNEE;  Surgeon:  Newt Minion, MD;  Location: [facility];  Service: Orthopedics;  Laterality: Right;  . AMPUTATION Left 08/12/2016   Procedure: LEFT BELOW KNEE AMPUTATION;  Surgeon: Newt Minion, MD;  Location: [facility];  Service: Orthopedics;  Laterality: Left;  . APPLICATION OF WOUND VAC Right 09/12/2016   Procedure: APPLICATION OF WOUND VAC ABOVE KNEE;  Surgeon: Newt Minion, MD;  Location: [facility];  Service: Orthopedics;  Laterality: Right;  . LOWER EXTREMITY ANGIOGRAPHY Bilateral 08/11/2016   Procedure: Lower Extremity Angiography;  Surgeon: Waynetta Sandy, MD;  Location: [facility];  Service: Cardiovascular;  Laterality: Bilateral;  . PERIPHERAL VASCULAR BALLOON ANGIOPLASTY Left 08/11/2016   Procedure: Peripheral Vascular Balloon Angioplasty;  Surgeon: Waynetta Sandy, MD;  Location: [facility];  Service: Cardiovascular;  Laterality: Left;  SFA  . THYROID SURGERY      There were no vitals filed for this visit.      Subjective Assessment - 01/30/17 1015    Subjective He wore left TTA prosthesis & right TFA liner only 3 hrs 2x/day with mild distal tibia discomfort.    Patient is accompained by: Family member  mother & girlfriend   Pertinent History R  TFA 09/12/2016, L TTA 08/12/2016, gout, HTN, DM, Neuropathy, renal failure   Limitations Lifting;Standing;Walking;House hold activities   Patient Stated Goals Use prostheses to get back into community. Get out of w/c.   Currently in Pain? No/denies  [facility] Adult PT Treatment/Exercise - 01/30/17 1015      Transfers   Transfers Sit to Stand;Stand to [employer] Transfers   Sit to Stand 3: Mod assist;With upper extremity assist;With armrests;From chair/3-in-1  To RW, 2nd person for safety   Sit to Stand Details Verbal cues for technique;Manual facilitation for weight shifting;Verbal cues for safe use of DME/AE   Sit to Stand Details (indicate cue type and reason) manual & verbal cues on  technique including wt shift & prostheses control including TFA knee extension   Stand to Sit 4: Min assist;4: Min guard;With upper extremity assist;With armrests;To chair/3-in-1  from RW   Stand to Sit Details (indicate cue type and reason) Verbal cues for technique;Verbal cues for sequencing;Verbal cues for safe use of DME/AE   Stand to Sit Details verbal cues on prothesis control / TFA knee flexion   Stand Pivot Transfers 3: Mod assist;With armrests  with RW, 2 people for safety   Stand Pivot Transfer Details (indicate cue type and reason) verbal & manual cues on RW movement, LE movement/placement & wt shift     Ambulation/Gait   Ambulation/Gait Yes   Ambulation/Gait Assistance 3: Mod assist;2: Max assist  2 people for safety, modA straight & MaxA turns   Ambulation/Gait Assistance Details manual & verbal cues on sequence (RW, RLE, RW, LLE...) TFA knee control, wt shift & upright posture;  turning cues on RW & LE movement/placement and wt shift   Ambulation Distance (Feet) 37 Feet  37, 25, 25' with 90* turns at end of each to position to sit   Assistive device Rolling walker;Prostheses  R TFA & L TTA prostheses   Ambulation Surface Indoor;Level     Prosthetics   Prosthetic Care Comments  increase wear to 4 hrs 2x/day TTA prosthesis & TFA liner; donne TFA prosthesis to stand at sink with family.    Current prosthetic wear tolerance (days/week)  daily   Current prosthetic wear tolerance (#hours/day)  3 hrs 2x/day TTA prosthesis & TFA liner   Current prosthetic weight-bearing tolerance (hours/day)  Reports no TTA pain with today's activities.    Residual limb condition  No open areas. Right TTA bone spur at distal tibia. PT recommended relief in prosthesis to accommodate.    Education Provided Skin check;Residual limb care;Proper Donning;Proper wear schedule/adjustment   Person(s) Educated Patient   Education Method Explanation;Verbal cues   Education Method Verbalized  understanding;Verbal cues required;Needs further instruction   Donning Prosthesis Minimal assist   Doffing Prosthesis Supervision                PT Education - 01/30/17 1030    Education provided Yes   Education Details smoking just prior to PT / exertion and increased HR & BP   Person(s) Educated Patient   Methods Explanation   Comprehension Verbalized understanding;Need further instruction          PT Short Term Goals - 01/30/17 2317      PT SHORT TERM GOAL #2   Title Patient tolerates wear of TTA prosthesis >8 hrs total /day & TFA prosthesis >2 hrs /day without skin issues.    Baseline 01/26/2017  Pt tolerates wear of TTA prosthesis & TFA liner 2 hrs 2x/day. Distal tibia pain reduced over last week.   Continue STG   Time 1   Period Months   Status On-going   Target Date 02/24/17     PT SHORT TERM GOAL #4   Title Patient reaches 5" anteriorly with RW  support with prostheses with supervision.    Baseline 01/26/2017:  Patient reaches 5" anteriorly with RW support with prostheses with minA.  Continue STG   Time 1   Period Months   Status On-going   Target Date 02/24/17     PT SHORT TERM GOAL #6   Title Patient performs stand-pivot transfer with RW & prostheses between 2 chairs with armrest with minA.    Time 4   Period Weeks   Status On-going   Target Date 02/24/17     PT SHORT TERM GOAL #7   Title Patient ambulates 38' with RW & prostheses with modA of 1 person.    Time 4   Period Weeks   Status On-going   Target Date 02/24/17           PT Long Term Goals - 01/12/17 1924      PT LONG TERM GOAL #1   Title Patient verbalizes and demonstrates understanding of prosthetic care to enable safe use of prostheses.  (Target Date: 04/28/2017)   Time 4   Period Months   Status On-going     PT LONG TERM GOAL #2   Title Patient tolerates wear of TTA prosthesis >90% and TFA >75% of awake hours to enable function throughout his day. (Target Date: 04/28/2017)    Time 4   Period Months   Status On-going     PT LONG TERM GOAL #3   Title Patient performs transfers including sit to/from stand and stand pivot transfers with LRAD & prostheses modified independent.  (Target Date: 04/28/2017)   Time 4   Period Months   Status On-going     PT LONG TERM GOAL #4   Title Patient performs standing balance with LRAD & prostheses reaching 10" anteriorly, reaching to floor, looking over shoulders and managing clothes modified independent.  (Target Date: 04/28/2017)   Time 4   Period Months   Status On-going     PT LONG TERM GOAL #5   Title Patient ambulates 300' with LRAD & prostheses modified independent to enable community mobility.  (Target Date: 04/28/2017)   Time 4   Period Months   Status On-going     PT LONG TERM GOAL #6   Title Patient negotiates ramps, curbs & stairs with LRAD & prostheses modified independent to enable community access.  (Target Date: 04/28/2017)   Time 4   Period Months   Status On-going     PT LONG TERM GOAL #7   Title Activities of Balance Confidence score >15%.  (Target Date: 04/28/2017)   Baseline Initial ABC 0.0%   Time 4   Period Months   Status On-going               Plan - 01/30/17 2319    Clinical Impression Statement PT was able to progress gait to include turning 90* to position to sit in chairs with armrests. He continues to require 2 people for safety but second person was able to progress to min guard on straight pathes.    Rehab Potential Good   PT Frequency 2x / week   PT Duration Other (comment)  4 months   PT Treatment/Interventions ADLs/Self Care Home Management;DME Instruction;Gait training;Stair training;Functional mobility training;Therapeutic activities;Therapeutic exercise;Balance training;Neuromuscular re-education;Patient/family education;Prosthetic Training;Manual techniques   PT Next Visit Plan Prosthetic gait with RW work on turning to sit in a chair with armrests,   Consulted and  Agree with Plan of Care Patient      Patient will benefit  from skilled therapeutic intervention in order to improve the following deficits and impairments:  Abnormal gait, Decreased activity tolerance, Decreased balance, Decreased endurance, Decreased knowledge of use of DME, Decreased mobility, Decreased range of motion, Decreased skin integrity, Decreased strength, Postural dysfunction, Prosthetic Dependency, Pain  Visit Diagnosis: Other abnormalities of gait and mobility  Unsteadiness on feet  Muscle weakness (generalized)  Contracture of muscle, multiple sites     Problem List Patient Active Problem List   Diagnosis Date Noted  . History of right above knee amputation ([place]) 09/21/2016  . Acquired absence of left leg below knee ([place]) 09/21/2016  . Transaminitis   . Diabetic peripheral neuropathy ([place]) 07/21/2016  . Renal failure 07/04/2016  . Gout 07/04/2016  . Hyperuricemia 06/21/2016  . HTN (hypertension) 01/05/2016  . Diabetes type 2, uncontrolled ([place]) 01/05/2016  . Hypothyroidism 05/10/2011    Todd [name] PT, DPT 01/30/2017, 11:30 PM  [facility] [address] [place], [place], [address] Phone: [phone]   Fax:  [phone]  Name: Todd [name] MRN: [id number] Date of Birth: 03-04-1956

## 2017-02-01 ENCOUNTER — Encounter: Payer: Self-pay | Admitting: Physical Therapy

## 2017-02-01 ENCOUNTER — Ambulatory Visit: Payer: BLUE CROSS/BLUE SHIELD | Admitting: Physical Therapy

## 2017-02-01 DIAGNOSIS — R2689 Other abnormalities of gait and mobility: Secondary | ICD-10-CM | POA: Diagnosis not present

## 2017-02-01 DIAGNOSIS — M6249 Contracture of muscle, multiple sites: Secondary | ICD-10-CM

## 2017-02-01 DIAGNOSIS — R2681 Unsteadiness on feet: Secondary | ICD-10-CM

## 2017-02-01 DIAGNOSIS — M6281 Muscle weakness (generalized): Secondary | ICD-10-CM

## 2017-02-01 NOTE — Therapy (Signed)
[facility] [address] [place], [place], [address] Phone: [phone]   Fax:  [phone]  Physical Therapy Treatment  Patient Details  Name: Todd [name] MRN: [id number] Date of Birth: 02/25/1956 Referring Provider: [name], MD  Encounter Date: 02/01/2017      PT End of Session - 02/01/17 2228    Visit Number 10   Number of Visits 35   Date for PT Re-Evaluation 04/28/17   Authorization Type BCBS primary & Medicaid secondary   PT Start Time 1015   PT Stop Time 1100   PT Time Calculation (min) 45 min   Equipment Utilized During Treatment Gait belt   Activity Tolerance Patient limited by fatigue;Patient tolerated treatment well  Patient report pain at "knot"   Behavior During Therapy [facility] for tasks assessed/performed      Past Medical History:  Diagnosis Date  . AKI (acute kidney injury) ([place])   . Constipated   . Diabetes mellitus without complication ([place])   . Diarrhea   . Elevated LFTs   . Goiter   . Gout   . Hyperlipidemia   . Hypertension   . Leukocytosis   . Reactive thrombocytosis   . Right BKA infection ([place]) 08/2016  . Right leg pain   . Sepsis due to undetermined organism ([place])   . Thyroid disease   . Wound infection after surgery 08/2016    Past Surgical History:  Procedure Laterality Date  . ABDOMINAL AORTOGRAM N/A 08/11/2016   Procedure: Abdominal Aortogram;  Surgeon: Waynetta Sandy, MD;  Location: [facility];  Service: Cardiovascular;  Laterality: N/A;  . ABDOMINAL AORTOGRAM W/LOWER EXTREMITY N/A 08/15/2016   Procedure: Abdominal Aortogram w/Lower Extremity;  Surgeon: Elam Dutch, MD;  Location: [place] [facility];  Service: Cardiovascular;  Laterality: N/A;  . AMPUTATION Right 08/17/2016   Procedure: RIGHT BELOW KNEE AMPUTATION;  Surgeon: Elam Dutch, MD;  Location: [place];  Service: Vascular;  Laterality: Right;  . AMPUTATION Right 09/12/2016   Procedure:  AMPUTATION ABOVE KNEE;  Surgeon: Newt Minion, MD;  Location: [facility];  Service: Orthopedics;  Laterality: Right;  . AMPUTATION Left 08/12/2016   Procedure: LEFT BELOW KNEE AMPUTATION;  Surgeon: Newt Minion, MD;  Location: [facility];  Service: Orthopedics;  Laterality: Left;  . APPLICATION OF WOUND VAC Right 09/12/2016   Procedure: APPLICATION OF WOUND VAC ABOVE KNEE;  Surgeon: Newt Minion, MD;  Location: [facility];  Service: Orthopedics;  Laterality: Right;  . LOWER EXTREMITY ANGIOGRAPHY Bilateral 08/11/2016   Procedure: Lower Extremity Angiography;  Surgeon: Waynetta Sandy, MD;  Location: [facility];  Service: Cardiovascular;  Laterality: Bilateral;  . PERIPHERAL VASCULAR BALLOON ANGIOPLASTY Left 08/11/2016   Procedure: Peripheral Vascular Balloon Angioplasty;  Surgeon: Waynetta Sandy, MD;  Location: [facility];  Service: Cardiovascular;  Laterality: Left;  SFA  . THYROID SURGERY      There were no vitals filed for this visit.      Subjective Assessment - 02/01/17 1015    Subjective He wore left TTA prosthesis & right TFA liner only 3 hrs 2x/day with mild distal tibia discomfort.    Patient is accompained by: Family member  mother & girlfriend   Pertinent History R  TFA 09/12/2016, L TTA 08/12/2016, gout, HTN, DM, Neuropathy, renal failure   Limitations Lifting;Standing;Walking;House hold activities   Patient Stated Goals Use prostheses to get back into community. Get out of w/c.   Currently in  Pain? No/denies                         [facility] Adult PT Treatment/Exercise - 02/01/17 1015      Transfers   Transfers Sit to Stand;Stand to [employer] Transfers   Sit to Stand 3: Mod assist;4: Min assist;With upper extremity assist;With armrests;From chair/3-in-1  To RW, 2nd person for safety   Sit to Stand Details Verbal cues for technique;Manual facilitation for weight shifting;Verbal cues for safe use of DME/AE   Stand to Sit 4: Min guard;5:  Supervision;With upper extremity assist;With armrests;To chair/3-in-1  from RW   Stand to Sit Details (indicate cue type and reason) Verbal cues for technique;Verbal cues for sequencing;Verbal cues for safe use of DME/AE   Stand Pivot Transfers 3: Mod assist;4: Min assist;With armrests  with RW, 2 people for safety     Ambulation/Gait   Ambulation/Gait Yes   Ambulation/Gait Assistance 3: Mod assist  2 people for safety,   Ambulation/Gait Assistance Details verbal & manual cues for sequence, wt shift & TFA knee control. Movements with turns to position to sit down.    Ambulation Distance (Feet) 37 Feet  37', 30' & 35'   Assistive device Rolling walker;Prostheses  R TFA & L TTA prostheses   Gait Pattern Step-through pattern;Decreased step length - left;Decreased stride length;Decreased stance time - right;Decreased weight shift to right;Right circumduction;Right hip hike;Antalgic;Left flexed knee in stance;Right flexed knee in stance;Wide base of support;Trunk flexed;Poor foot clearance - left   Ambulation Surface Indoor;Level   Curb 2: Max assist  modified curb of 4" block to intiate   Curb Details (indicate cue type and reason) PT demo & instructed in proper technique including sequence, wt shift and TFA knee control.     Prosthetics   Prosthetic Care Comments  increase wear to 4 hrs 2x/day TTA prosthesis & TFA liner; donne TFA prosthesis to stand at sink with family.    Current prosthetic wear tolerance (days/week)  daily   Current prosthetic wear tolerance (#hours/day)  3 hrs 2x/day TTA prosthesis & TFA liner   Current prosthetic weight-bearing tolerance (hours/day)  Reports no TTA pain with today's activities.    Residual limb condition  No open areas. Right TTA bone spur at distal tibia. PT recommended relief in prosthesis to accommodate.    Education Provided Skin check;Residual limb care;Proper Donning;Proper wear schedule/adjustment   Person(s) Educated Patient   Education Method  Explanation;Verbal cues   Education Method Verbalized understanding;Verbal cues required;Needs further instruction   Donning Prosthesis Supervision;Minimal assist  SBA TTA & MinA TFA   Doffing Prosthesis Supervision                  PT Short Term Goals - 01/30/17 2317      PT SHORT TERM GOAL #2   Title Patient tolerates wear of TTA prosthesis >8 hrs total /day & TFA prosthesis >2 hrs /day without skin issues.    Baseline 01/26/2017  Pt tolerates wear of TTA prosthesis & TFA liner 2 hrs 2x/day. Distal tibia pain reduced over last week.   Continue STG   Time 1   Period Months   Status On-going   Target Date 02/24/17     PT SHORT TERM GOAL #4   Title Patient reaches 5" anteriorly with RW support with prostheses with supervision.    Baseline 01/26/2017:  Patient reaches 5" anteriorly with RW support with prostheses with minA.  Continue STG   Time  1   Period Months   Status On-going   Target Date 02/24/17     PT SHORT TERM GOAL #6   Title Patient performs stand-pivot transfer with RW & prostheses between 2 chairs with armrest with minA.    Time 4   Period Weeks   Status On-going   Target Date 02/24/17     PT SHORT TERM GOAL #7   Title Patient ambulates 12' with RW & prostheses with modA of 1 person.    Time 4   Period Weeks   Status On-going   Target Date 02/24/17           PT Long Term Goals - 01/12/17 1924      PT LONG TERM GOAL #1   Title Patient verbalizes and demonstrates understanding of prosthetic care to enable safe use of prostheses.  (Target Date: 04/28/2017)   Time 4   Period Months   Status On-going     PT LONG TERM GOAL #2   Title Patient tolerates wear of TTA prosthesis >90% and TFA >75% of awake hours to enable function throughout his day. (Target Date: 04/28/2017)   Time 4   Period Months   Status On-going     PT LONG TERM GOAL #3   Title Patient performs transfers including sit to/from stand and stand pivot transfers with LRAD &  prostheses modified independent.  (Target Date: 04/28/2017)   Time 4   Period Months   Status On-going     PT LONG TERM GOAL #4   Title Patient performs standing balance with LRAD & prostheses reaching 10" anteriorly, reaching to floor, looking over shoulders and managing clothes modified independent.  (Target Date: 04/28/2017)   Time 4   Period Months   Status On-going     PT LONG TERM GOAL #5   Title Patient ambulates 300' with LRAD & prostheses modified independent to enable community mobility.  (Target Date: 04/28/2017)   Time 4   Period Months   Status On-going     PT LONG TERM GOAL #6   Title Patient negotiates ramps, curbs & stairs with LRAD & prostheses modified independent to enable community access.  (Target Date: 04/28/2017)   Time 4   Period Months   Status On-going     PT LONG TERM GOAL #7   Title Activities of Balance Confidence score >15%.  (Target Date: 04/28/2017)   Baseline Initial ABC 0.0%   Time 4   Period Months   Status On-going               Plan - 02/01/17 2229    Clinical Impression Statement Patient improved sit to/from stand w/c to RW and gait with RW including turning to position to sit with skilled instruction.    Rehab Potential Good   PT Frequency 2x / week   PT Duration Other (comment)  4 months   PT Treatment/Interventions ADLs/Self Care Home Management;DME Instruction;Gait training;Stair training;Functional mobility training;Therapeutic activities;Therapeutic exercise;Balance training;Neuromuscular re-education;Patient/family education;Prosthetic Training;Manual techniques   PT Next Visit Plan Prosthetic gait with RW work on turning to sit in a chair with armrests,   Consulted and Agree with Plan of Care Patient      Patient Todd benefit from skilled therapeutic intervention in order to improve the following deficits and impairments:  Abnormal gait, Decreased activity tolerance, Decreased balance, Decreased endurance, Decreased  knowledge of use of DME, Decreased mobility, Decreased range of motion, Decreased skin integrity, Decreased strength, Postural dysfunction, Prosthetic Dependency, Pain  Visit Diagnosis: Other abnormalities of gait and mobility  Unsteadiness on feet  Muscle weakness (generalized)  Contracture of muscle, multiple sites     Problem List Patient Active Problem List   Diagnosis Date Noted  . History of right above knee amputation ([place]) 09/21/2016  . Acquired absence of left leg below knee ([place]) 09/21/2016  . Transaminitis   . Diabetic peripheral neuropathy ([place]) 07/21/2016  . Renal failure 07/04/2016  . Gout 07/04/2016  . Hyperuricemia 06/21/2016  . HTN (hypertension) 01/05/2016  . Diabetes type 2, uncontrolled ([place]) 01/05/2016  . Hypothyroidism 05/10/2011    Todd [name] PT, DPT 02/01/2017, 10:31 PM  [facility] [address] [place], [place], [address] Phone: [phone]   Fax:  [phone]  Name: Todd [name] MRN: [id number] Date of Birth: 1955/12/30

## 2017-02-06 ENCOUNTER — Ambulatory Visit: Payer: BLUE CROSS/BLUE SHIELD | Admitting: Physical Therapy

## 2017-02-06 ENCOUNTER — Encounter: Payer: Self-pay | Admitting: Physical Therapy

## 2017-02-06 DIAGNOSIS — R2689 Other abnormalities of gait and mobility: Secondary | ICD-10-CM | POA: Diagnosis not present

## 2017-02-06 DIAGNOSIS — R2681 Unsteadiness on feet: Secondary | ICD-10-CM

## 2017-02-06 DIAGNOSIS — M6249 Contracture of muscle, multiple sites: Secondary | ICD-10-CM

## 2017-02-06 DIAGNOSIS — M6281 Muscle weakness (generalized): Secondary | ICD-10-CM

## 2017-02-06 NOTE — Therapy (Signed)
[facility] [address] [place], [place], [address] Phone: [phone]   Fax:  [phone]  Physical Therapy Treatment  Patient Details  Name: Todd [name] MRN: [id number] Date of Birth: [birth year] Referring Provider: [name], MD  Encounter Date: 02/06/2017      PT End of Session - 02/06/17 2334    Visit Number 11   Number of Visits 35   Date for PT Re-Evaluation 04/28/17   Authorization Type BCBS primary & Medicaid secondary   PT Start Time 1015   PT Stop Time 1100   PT Time Calculation (min) 45 min   Equipment Utilized During Treatment Gait belt   Activity Tolerance Patient limited by fatigue;Patient tolerated treatment well  Patient report pain at "knot"   Behavior During Therapy [facility] for tasks assessed/performed      Past Medical History:  Diagnosis Date  . AKI (acute kidney injury) ([place])   . Constipated   . Diabetes mellitus without complication ([place])   . Diarrhea   . Elevated LFTs   . Goiter   . Gout   . Hyperlipidemia   . Hypertension   . Leukocytosis   . Reactive thrombocytosis   . Right BKA infection ([place]) 08/2016  . Right leg pain   . Sepsis due to undetermined organism ([place])   . Thyroid disease   . Wound infection after surgery 08/2016    Past Surgical History:  Procedure Laterality Date  . ABDOMINAL AORTOGRAM N/A 08/11/2016   Procedure: Abdominal Aortogram;  Surgeon: Waynetta Sandy, MD;  Location: [facility];  Service: Cardiovascular;  Laterality: N/A;  . ABDOMINAL AORTOGRAM W/LOWER EXTREMITY N/A 08/15/2016   Procedure: Abdominal Aortogram w/Lower Extremity;  Surgeon: Elam Dutch, MD;  Location: [facility];  Service: Cardiovascular;  Laterality: N/A;  . AMPUTATION Right 08/17/2016   Procedure: RIGHT BELOW KNEE AMPUTATION;  Surgeon: Elam Dutch, MD;  Location: [facility];  Service: Vascular;  Laterality: Right;  . AMPUTATION Right 09/12/2016   Procedure:  AMPUTATION ABOVE KNEE;  Surgeon: Newt Minion, MD;  Location: [facility];  Service: Orthopedics;  Laterality: Right;  . AMPUTATION Left 08/12/2016   Procedure: LEFT BELOW KNEE AMPUTATION;  Surgeon: Newt Minion, MD;  Location: [facility];  Service: Orthopedics;  Laterality: Left;  . APPLICATION OF WOUND VAC Right 09/12/2016   Procedure: APPLICATION OF WOUND VAC ABOVE KNEE;  Surgeon: Newt Minion, MD;  Location: [facility];  Service: Orthopedics;  Laterality: Right;  . LOWER EXTREMITY ANGIOGRAPHY Bilateral 08/11/2016   Procedure: Lower Extremity Angiography;  Surgeon: Waynetta Sandy, MD;  Location: [facility];  Service: Cardiovascular;  Laterality: Bilateral;  . PERIPHERAL VASCULAR BALLOON ANGIOPLASTY Left 08/11/2016   Procedure: Peripheral Vascular Balloon Angioplasty;  Surgeon: Waynetta Sandy, MD;  Location: [facility];  Service: Cardiovascular;  Laterality: Left;  SFA  . THYROID SURGERY      There were no vitals filed for this visit.      Subjective Assessment - 02/06/17 1016    Subjective He wore TTA prosthesis & right TFA liner only 3hrs 2x/day with no discomfort.    Patient is accompained by: Family member   Pertinent History R  TFA 09/12/2016, L TTA 08/12/2016, gout, HTN, DM, Neuropathy, renal failure   Limitations Other (comment)   Patient Stated Goals Use prostheses to get back into community. Get out of w/c.   Currently in Pain? No/denies  [facility] Adult PT Treatment/Exercise - 02/06/17 1015      Transfers   Transfers Sit to Stand;Stand to [employer] Transfers   Sit to Stand 3: Mod assist;4: Min assist;With upper extremity assist;With armrests;From chair/3-in-1  To RW, 2nd person for safety   Sit to Stand Details Verbal cues for technique;Manual facilitation for weight shifting;Verbal cues for safe use of DME/AE   Sit to Stand Details (indicate cue type and reason) verbal cues on position right shoulder over right hand on  RW to decrease pushing RW away & rotating;  extending TFA prosthesis while arising.    Stand to Sit 4: Min guard;5: Supervision;With upper extremity assist;With armrests;To chair/3-in-1  from RW   Stand to Sit Details (indicate cue type and reason) Verbal cues for technique;Verbal cues for sequencing;Verbal cues for safe use of DME/AE   Stand Pivot Transfers 3: Mod assist;4: Min assist;With armrests  with RW, 2 people for safety     Ambulation/Gait   Ambulation/Gait Yes   Ambulation/Gait Assistance 3: Mod assist  2 people for safety,   Ambulation/Gait Assistance Details Verbal & tactile cues on sequence, upright posture, initial contact & TFA prosthetic knee control and weight shift.    Ambulation Distance (Feet) 50 Feet  65' 10' & 20'   Assistive device Rolling walker;Prostheses  R TFA & L TTA prostheses   Gait Pattern Step-through pattern;Decreased step length - left;Decreased stride length;Decreased stance time - right;Decreased weight shift to right;Right circumduction;Right hip hike;Antalgic;Left flexed knee in stance;Right flexed knee in stance;Wide base of support;Trunk flexed;Poor foot clearance - left   Ambulation Surface Indoor;Level   Curb 2: Max assist  modified curb of 4" block 1st & aerobic step (3-4") 2nd    Curb Details (indicate cue type and reason) PT demo & instructed technique for moving RW & technique. 2nd person assisted with RW movement & stabilization.      Prosthetics   Prosthetic Care Comments  PT added sock (from 1-ply to 3-ply under pelite liner) to TTA prosthesis at end of session as distal limb pressure on last ambulation. PT instructed to increase wear to 4 hrs 2x/day TTA prosthesis & TFA liner; donne TFA prosthesis to stand at sink with family.    Current prosthetic wear tolerance (days/week)  daily   Current prosthetic wear tolerance (#hours/day)  3 hrs 2x/day TTA prosthesis & TFA liner   Current prosthetic weight-bearing tolerance (hours/day)  distal limb  pressure on TTA limb. PT instructed in adjusting ply socks.    Residual limb condition  No open areas. Right TTA bone spur at distal tibia. PT recommended relief in prosthesis to accommodate.    Education Provided Skin check;Residual limb care;Proper Donning;Proper wear schedule/adjustment;Correct ply sock adjustment   Person(s) Educated Patient   Education Method Explanation;Verbal cues;Demonstration   Education Method Verbalized understanding;Verbal cues required;Needs further instruction                  PT Short Term Goals - 01/30/17 2317      PT SHORT TERM GOAL #2   Title Patient tolerates wear of TTA prosthesis >8 hrs total /day & TFA prosthesis >2 hrs /day without skin issues.    Baseline 01/26/2017  Pt tolerates wear of TTA prosthesis & TFA liner 2 hrs 2x/day. Distal tibia pain reduced over last week.   Continue STG   Time 1   Period Months   Status On-going   Target Date 02/24/17     PT SHORT TERM GOAL #4  Title Patient reaches 5" anteriorly with RW support with prostheses with supervision.    Baseline 01/26/2017:  Patient reaches 5" anteriorly with RW support with prostheses with minA.  Continue STG   Time 1   Period Months   Status On-going   Target Date 02/24/17     PT SHORT TERM GOAL #6   Title Patient performs stand-pivot transfer with RW & prostheses between 2 chairs with armrest with minA.    Time 4   Period Weeks   Status On-going   Target Date 02/24/17     PT SHORT TERM GOAL #7   Title Patient ambulates 16' with RW & prostheses with modA of 1 person.    Time 4   Period Weeks   Status On-going   Target Date 02/24/17           PT Long Term Goals - 01/12/17 1924      PT LONG TERM GOAL #1   Title Patient verbalizes and demonstrates understanding of prosthetic care to enable safe use of prostheses.  (Target Date: 04/28/2017)   Time 4   Period Months   Status On-going     PT LONG TERM GOAL #2   Title Patient tolerates wear of TTA prosthesis  >90% and TFA >75% of awake hours to enable function throughout his day. (Target Date: 04/28/2017)   Time 4   Period Months   Status On-going     PT LONG TERM GOAL #3   Title Patient performs transfers including sit to/from stand and stand pivot transfers with LRAD & prostheses modified independent.  (Target Date: 04/28/2017)   Time 4   Period Months   Status On-going     PT LONG TERM GOAL #4   Title Patient performs standing balance with LRAD & prostheses reaching 10" anteriorly, reaching to floor, looking over shoulders and managing clothes modified independent.  (Target Date: 04/28/2017)   Time 4   Period Months   Status On-going     PT LONG TERM GOAL #5   Title Patient ambulates 300' with LRAD & prostheses modified independent to enable community mobility.  (Target Date: 04/28/2017)   Time 4   Period Months   Status On-going     PT LONG TERM GOAL #6   Title Patient negotiates ramps, curbs & stairs with LRAD & prostheses modified independent to enable community access.  (Target Date: 04/28/2017)   Time 4   Period Months   Status On-going     PT LONG TERM GOAL #7   Title Activities of Balance Confidence score >15%.  (Target Date: 04/28/2017)   Baseline Initial ABC 0.0%   Time 4   Period Months   Status On-going               Plan - 02/06/17 1241    Clinical Impression Statement Patient required less assistance for gait with RW today and increased distance. Patient needs 2 people for safe sit to stand and PT encouraged practicing at sink which is more secure & he can do at home. He feels he can get some family to come help him work on sit to stand.    Rehab Potential Good   PT Frequency 2x / week   PT Duration Other (comment)  4 months   PT Treatment/Interventions ADLs/Self Care Home Management;DME Instruction;Gait training;Stair training;Functional mobility training;Therapeutic activities;Therapeutic exercise;Balance training;Neuromuscular  re-education;Patient/family education;Prosthetic Training;Manual techniques   PT Next Visit Plan Prosthetic gait with RW work on turning to sit in a  chair with armrests, Try curb outdoors or aerobic steps for modified curb again.    Consulted and Agree with Plan of Care Patient      Patient will benefit from skilled therapeutic intervention in order to improve the following deficits and impairments:  Abnormal gait, Decreased activity tolerance, Decreased balance, Decreased endurance, Decreased knowledge of use of DME, Decreased mobility, Decreased range of motion, Decreased skin integrity, Decreased strength, Postural dysfunction, Prosthetic Dependency, Pain  Visit Diagnosis: Other abnormalities of gait and mobility  Unsteadiness on feet  Muscle weakness (generalized)  Contracture of muscle, multiple sites     Problem List Patient Active Problem List   Diagnosis Date Noted  . History of right above knee amputation ([place]) 09/21/2016  . Acquired absence of left leg below knee ([place]) 09/21/2016  . Transaminitis   . Diabetic peripheral neuropathy ([place]) 07/21/2016  . Renal failure 07/04/2016  . Gout 07/04/2016  . Hyperuricemia 06/21/2016  . HTN (hypertension) 01/05/2016  . Diabetes type 2, uncontrolled ([place]) 01/05/2016  . Hypothyroidism 05/10/2011    Jame Morrell PT, DPT 02/06/2017, 12:44 PM  [facility] [address] [place], [place], [address] Phone: [phone]   Fax:  [phone]  Name: Todd [name] MRN: [id number] Date of Birth: Jun 19, 1955

## 2017-02-09 ENCOUNTER — Ambulatory Visit: Payer: BLUE CROSS/BLUE SHIELD | Admitting: Physical Therapy

## 2017-02-09 ENCOUNTER — Encounter: Payer: Self-pay | Admitting: Physical Therapy

## 2017-02-09 DIAGNOSIS — M6281 Muscle weakness (generalized): Secondary | ICD-10-CM

## 2017-02-09 DIAGNOSIS — R2681 Unsteadiness on feet: Secondary | ICD-10-CM

## 2017-02-09 DIAGNOSIS — R2689 Other abnormalities of gait and mobility: Secondary | ICD-10-CM | POA: Diagnosis not present

## 2017-02-10 NOTE — Therapy (Signed)
[facility] [address] [place], [place], [address] Phone: [phone]   Fax:  [phone]  Physical Therapy Treatment  Patient Details  Name: Todd [name] MRN: [id number] Date of Birth: 1956-06-01 Referring Provider: [name], MD  Encounter Date: 02/09/2017      PT End of Session - 02/09/17 1106    Visit Number 12   Number of Visits 35   Date for PT Re-Evaluation 04/28/17   Authorization Type BCBS primary & Medicaid secondary   PT Start Time 1100   PT Stop Time 1140   PT Time Calculation (min) 40 min   Equipment Utilized During Treatment Gait belt   Activity Tolerance Patient limited by fatigue;Patient tolerated treatment well  Patient report pain at "knot"   Behavior During Therapy [facility] for tasks assessed/performed      Past Medical History:  Diagnosis Date  . AKI (acute kidney injury) ([place])   . Constipated   . Diabetes mellitus without complication ([place])   . Diarrhea   . Elevated LFTs   . Goiter   . Gout   . Hyperlipidemia   . Hypertension   . Leukocytosis   . Reactive thrombocytosis   . Right BKA infection ([place]) 08/2016  . Right leg pain   . Sepsis due to undetermined organism ([place])   . Thyroid disease   . Wound infection after surgery 08/2016    Past Surgical History:  Procedure Laterality Date  . ABDOMINAL AORTOGRAM N/A 08/11/2016   Procedure: Abdominal Aortogram;  Surgeon: Waynetta Sandy, MD;  Location: [facility];  Service: Cardiovascular;  Laterality: N/A;  . ABDOMINAL AORTOGRAM W/LOWER EXTREMITY N/A 08/15/2016   Procedure: Abdominal Aortogram w/Lower Extremity;  Surgeon: Elam Dutch, MD;  Location: [facility];  Service: Cardiovascular;  Laterality: N/A;  . AMPUTATION Right 08/17/2016   Procedure: RIGHT BELOW KNEE AMPUTATION;  Surgeon: Elam Dutch, MD;  Location: [facility];  Service: Vascular;  Laterality: Right;  . AMPUTATION Right 09/12/2016   Procedure:  AMPUTATION ABOVE KNEE;  Surgeon: Newt Minion, MD;  Location: [facility];  Service: Orthopedics;  Laterality: Right;  . AMPUTATION Left 08/12/2016   Procedure: LEFT BELOW KNEE AMPUTATION;  Surgeon: Newt Minion, MD;  Location: [facility];  Service: Orthopedics;  Laterality: Left;  . APPLICATION OF WOUND VAC Right 09/12/2016   Procedure: APPLICATION OF WOUND VAC ABOVE KNEE;  Surgeon: Newt Minion, MD;  Location: [facility];  Service: Orthopedics;  Laterality: Right;  . LOWER EXTREMITY ANGIOGRAPHY Bilateral 08/11/2016   Procedure: Lower Extremity Angiography;  Surgeon: Waynetta Sandy, MD;  Location: [facility];  Service: Cardiovascular;  Laterality: Bilateral;  . PERIPHERAL VASCULAR BALLOON ANGIOPLASTY Left 08/11/2016   Procedure: Peripheral Vascular Balloon Angioplasty;  Surgeon: Waynetta Sandy, MD;  Location: [facility];  Service: Cardiovascular;  Laterality: Left;  SFA  . THYROID SURGERY      There were no vitals filed for this visit.      Subjective Assessment - 02/09/17 1103    Subjective Increased liner on AKA to 4 hours on 2x day with increased pain that evening. Also wearing BKA prosthesis the 4 hours 2x day as well. No issues with increased wear on left leg. No falls.    Patient is accompained by: Family member   Pertinent History R  TFA 09/12/2016, L TTA 08/12/2016, gout, HTN, DM, Neuropathy, renal failure   Patient Stated Goals Use prostheses to get back into community.  Get out of w/c.   Currently in Pain? No/denies   Pain Score 0-No pain            [facility] Adult PT Treatment/Exercise - 02/09/17 1107      Transfers   Transfers Stand to Sit;Sit to Stand   Sit to Stand 3: Mod assist;4: Min assist;4: Min guard   Sit to Stand Details Verbal cues for technique;Manual facilitation for weight shifting;Verbal cues for safe use of DME/AE   Sit to Stand Details (indicate cue type and reason) repetitive standing to/from sink from/to wheelchair with cues and assist,  downgraded from mod assist to min guard assist with repitition   Stand to Sit 4: Min assist;4: Min guard   Stand to Sit Details (indicate cue type and reason) Verbal cues for technique;Verbal cues for sequencing;Verbal cues for safe use of DME/AE   Stand to Sit Details see comments above regarding sit/stand's   Stand Pivot Transfers 3: Mod assist;2: Max assist   Stand Pivot Transfer Details (indicate cue type and reason) from standard chair to wheelchair with mod assist initally progressing to max assist due to pt fatigue as he approached wheelchair.      Ambulation/Gait   Ambulation/Gait Yes   Ambulation/Gait Assistance 3: Mod assist;4: Min assist  second person for safety   Ambulation/Gait Assistance Details cues needed on sequencing , step length, weight shifting and RW proximity with gait. mod assist initially with 2cd person for safety progressing to max assist with min assist from second person as pt fatigued.    Ambulation Distance (Feet) 45 Feet  x1   Assistive device Rolling walker;Prostheses   Gait Pattern Step-through pattern;Decreased step length - left;Decreased stride length;Decreased stance time - right;Decreased weight shift to right;Right circumduction;Right hip hike;Antalgic;Left flexed knee in stance;Right flexed knee in stance;Wide base of support;Trunk flexed;Poor foot clearance - left   Ambulation Surface Level;Indoor     Prosthetics   Current prosthetic wear tolerance (days/week)  daily   Current prosthetic wear tolerance (#hours/day)  4 hrs 2x/day TTA prosthesis & TFA liner   Residual limb condition  No open areas. Right TTA bone spur at distal tibia. PT recommended relief in prosthesis to accommodate.    Education Provided Residual limb care;Correct ply sock adjustment;Proper wear schedule/adjustment;Proper weight-bearing schedule/adjustment;Proper Donning;Proper Doffing   Person(s) Educated Patient   Education Method Explanation;Demonstration;Verbal cues   Education  Method Verbalized understanding;Verbal cues required;Needs further instruction   Donning Prosthesis Supervision;Minimal assist  min assist with TFA   Doffing Prosthesis Supervision             PT Short Term Goals - 01/30/17 2317      PT SHORT TERM GOAL #2   Title Patient tolerates wear of TTA prosthesis >8 hrs total /day & TFA prosthesis >2 hrs /day without skin issues.    Baseline 01/26/2017  Pt tolerates wear of TTA prosthesis & TFA liner 2 hrs 2x/day. Distal tibia pain reduced over last week.   Continue STG   Time 1   Period Months   Status On-going   Target Date 02/24/17     PT SHORT TERM GOAL #4   Title Patient reaches 5" anteriorly with RW support with prostheses with supervision.    Baseline 01/26/2017:  Patient reaches 5" anteriorly with RW support with prostheses with minA.  Continue STG   Time 1   Period Months   Status On-going   Target Date 02/24/17     PT SHORT TERM GOAL #6   Title  Patient performs stand-pivot transfer with RW & prostheses between 2 chairs with armrest with minA.    Time 4   Period Weeks   Status On-going   Target Date 02/24/17     PT SHORT TERM GOAL #7   Title Patient ambulates 32' with RW & prostheses with modA of 1 person.    Time 4   Period Weeks   Status On-going   Target Date 02/24/17           PT Long Term Goals - 01/12/17 1924      PT LONG TERM GOAL #1   Title Patient verbalizes and demonstrates understanding of prosthetic care to enable safe use of prostheses.  (Target Date: 04/28/2017)   Time 4   Period Months   Status On-going     PT LONG TERM GOAL #2   Title Patient tolerates wear of TTA prosthesis >90% and TFA >75% of awake hours to enable function throughout his day. (Target Date: 04/28/2017)   Time 4   Period Months   Status On-going     PT LONG TERM GOAL #3   Title Patient performs transfers including sit to/from stand and stand pivot transfers with LRAD & prostheses modified independent.  (Target Date:  04/28/2017)   Time 4   Period Months   Status On-going     PT LONG TERM GOAL #4   Title Patient performs standing balance with LRAD & prostheses reaching 10" anteriorly, reaching to floor, looking over shoulders and managing clothes modified independent.  (Target Date: 04/28/2017)   Time 4   Period Months   Status On-going     PT LONG TERM GOAL #5   Title Patient ambulates 300' with LRAD & prostheses modified independent to enable community mobility.  (Target Date: 04/28/2017)   Time 4   Period Months   Status On-going     PT LONG TERM GOAL #6   Title Patient negotiates ramps, curbs & stairs with LRAD & prostheses modified independent to enable community access.  (Target Date: 04/28/2017)   Time 4   Period Months   Status On-going     PT LONG TERM GOAL #7   Title Activities of Balance Confidence score >15%.  (Target Date: 04/28/2017)   Baseline Initial ABC 0.0%   Time 4   Period Months   Status On-going          Plan - 02/09/17 1106    Clinical Impression Statement Today's skilled session continued to address sit/stand at sink from/to wheelchair to assist with skill and comfort for standing at home. Pt progressed to min guard assist with repitition. Also continued to work on gait with RW with 90 degree turns. Pt is progressing toward goals and continues to fatigue quickly. Pt should  benefit from continued PT to progress toward unmet goals.   Rehab Potential Good   PT Frequency 2x / week   PT Duration Other (comment)  4 months   PT Treatment/Interventions ADLs/Self Care Home Management;DME Instruction;Gait training;Stair training;Functional mobility training;Therapeutic activities;Therapeutic exercise;Balance training;Neuromuscular re-education;Patient/family education;Prosthetic Training;Manual techniques   PT Next Visit Plan Prosthetic gait with RW work on turning to sit in a chair with armrests, Try curb outdoors or aerobic steps for modified curb again.    Consulted and  Agree with Plan of Care Patient      Patient will benefit from skilled therapeutic intervention in order to improve the following deficits and impairments:  Abnormal gait, Decreased activity tolerance, Decreased balance, Decreased endurance, Decreased knowledge  of use of DME, Decreased mobility, Decreased range of motion, Decreased skin integrity, Decreased strength, Postural dysfunction, Prosthetic Dependency, Pain  Visit Diagnosis: Other abnormalities of gait and mobility  Unsteadiness on feet  Muscle weakness (generalized)     Problem List Patient Active Problem List   Diagnosis Date Noted  . History of right above knee amputation ([place]) 09/21/2016  . Acquired absence of left leg below knee ([place]) 09/21/2016  . Transaminitis   . Diabetic peripheral neuropathy ([place]) 07/21/2016  . Renal failure 07/04/2016  . Gout 07/04/2016  . Hyperuricemia 06/21/2016  . HTN (hypertension) 01/05/2016  . Diabetes type 2, uncontrolled ([place]) 01/05/2016  . Hypothyroidism 05/10/2011    Willow Ora, PTA, [facility] [address], [place], [place] [address] [phone] 02/10/17, 6:52 PM   Name: Todd [name] MRN: [id number] Date of Birth: 1955/06/24

## 2017-02-11 ENCOUNTER — Other Ambulatory Visit: Payer: Self-pay | Admitting: Family Medicine

## 2017-02-14 ENCOUNTER — Encounter: Payer: Self-pay | Admitting: Family Medicine

## 2017-02-14 ENCOUNTER — Ambulatory Visit (INDEPENDENT_AMBULATORY_CARE_PROVIDER_SITE_OTHER): Payer: BLUE CROSS/BLUE SHIELD | Admitting: Family Medicine

## 2017-02-14 VITALS — BP 111/66 | HR 70 | Temp 97.8°F | Ht 68.0 in

## 2017-02-14 DIAGNOSIS — I10 Essential (primary) hypertension: Secondary | ICD-10-CM

## 2017-02-14 DIAGNOSIS — IMO0002 Reserved for concepts with insufficient information to code with codable children: Secondary | ICD-10-CM

## 2017-02-14 DIAGNOSIS — R740 Nonspecific elevation of levels of transaminase and lactic acid dehydrogenase [LDH]: Secondary | ICD-10-CM | POA: Diagnosis not present

## 2017-02-14 DIAGNOSIS — E1165 Type 2 diabetes mellitus with hyperglycemia: Secondary | ICD-10-CM

## 2017-02-14 DIAGNOSIS — Z794 Long term (current) use of insulin: Secondary | ICD-10-CM | POA: Diagnosis not present

## 2017-02-14 DIAGNOSIS — E1121 Type 2 diabetes mellitus with diabetic nephropathy: Secondary | ICD-10-CM | POA: Diagnosis not present

## 2017-02-14 DIAGNOSIS — E039 Hypothyroidism, unspecified: Secondary | ICD-10-CM

## 2017-02-14 DIAGNOSIS — E790 Hyperuricemia without signs of inflammatory arthritis and tophaceous disease: Secondary | ICD-10-CM | POA: Diagnosis not present

## 2017-02-14 DIAGNOSIS — R7401 Elevation of levels of liver transaminase levels: Secondary | ICD-10-CM

## 2017-02-14 LAB — BAYER DCA HB A1C WAIVED: HB A1C (BAYER DCA - WAIVED): 6 % (ref <7.0)

## 2017-02-14 MED ORDER — CARVEDILOL 3.125 MG PO TABS: 3.1250 mg | ORAL_TABLET | Freq: Two times a day (BID) | ORAL | 0 refills | Status: DC

## 2017-02-14 MED ORDER — ATORVASTATIN CALCIUM 40 MG PO TABS: 40.0000 mg | ORAL_TABLET | Freq: Every day | ORAL | Status: DC

## 2017-02-14 MED ORDER — COLCRYS 0.6 MG PO TABS: 0.6000 mg | ORAL_TABLET | Freq: Two times a day (BID) | ORAL | 1 refills | Status: DC | PRN

## 2017-02-14 MED ORDER — MELOXICAM 15 MG PO TABS: 15.0000 mg | ORAL_TABLET | Freq: Every day | ORAL | 5 refills | Status: DC

## 2017-02-14 NOTE — Progress Notes (Signed)
 Subjective:  Patient ID: Todd [name], male    DOB: 02/04/1956  Age: 61 y.o. MRN: [id number]  CC: Diabetes (pt here today for routine follow up of his chronic medical conditions and medication refills)   HPI Todd [name] presents for Follow-up of diabetes. Patient does check blood sugar at home. No log returned. Doing "Good"  Patient denies symptoms such as polyuria, polydipsia, excessive hunger, nausea No significant hypoglycemic spells noted. Medications as noted below. Taking them regularly without complication/adverse reaction being reported today.   Patient presents for follow-up on  thyroid. The patient has a history of hypothyroidism for many years. It has been stable recently. Pt. denies any change in  voice, loss of hair, heat or cold intolerance. Energy level has been adequate to good. Patient denies constipation and diarrhea. No myxedema. Medication is as noted below. Verified that pt is taking it daily on an empty stomach. Well tolerated.   Pt. States he has moderate pain in RLE after wearing his prosthesis for several hours. He is in rehab with PT to help him walk. Making good progress. In wheelchair mostly, including today Still smoking about 10 cigarettes a day. Cutting back.  Depression screen PHQ 2/9 02/14/2017 08/05/2016 08/03/2016  Decreased Interest 0 0 0  Down, Depressed, Hopeless 0 0 0  PHQ - 2 Score 0 0 0    History Todd [name] has a past medical history of AKI (acute kidney injury) ([facility]); Constipated; Diabetes mellitus without complication ([facility]); Diarrhea; Elevated LFTs; Goiter; Gout; Hyperlipidemia; Hypertension; Leukocytosis; Reactive thrombocytosis; Right BKA infection ([facility]) (08/2016); Right leg pain; Sepsis due to undetermined organism ([facility]); Thyroid disease; and Wound infection after surgery (08/2016).   He has a past surgical history that includes Thyroid surgery; Lower Extremity Angiography (Bilateral, 08/11/2016); ABDOMINAL AORTOGRAM (N/A, 08/11/2016); PERIPHERAL  VASCULAR BALLOON ANGIOPLASTY (Left, 08/11/2016); ABDOMINAL AORTOGRAM W/LOWER EXTREMITY (N/A, 08/15/2016); Amputation (Right, 08/17/2016); Amputation (Right, 09/12/2016); Application if wound vac (Right, 09/12/2016); and Amputation (Left, 08/12/2016).   His family history includes Diabetes in his maternal aunt and maternal uncle; Heart disease in his mother; Pneumonia in his father.He reports that he has been smoking Cigarettes.  He has a 33.75 pack-year smoking history. He has never used smokeless tobacco. He reports that he does not drink alcohol or use drugs.    ROS Review of Systems  Constitutional: Negative for chills, diaphoresis, fever and unexpected weight change.  HENT: Negative for congestion and hearing loss.   Eyes: Negative for visual disturbance.  Respiratory: Negative for cough and shortness of breath.   Cardiovascular: Negative for chest pain.  Gastrointestinal: Negative for abdominal pain, constipation and diarrhea.  Genitourinary: Negative for dysuria and flank pain.  Musculoskeletal: Positive for arthralgias and myalgias.  Skin: Negative for rash.  Neurological: Negative for dizziness and headaches.  Psychiatric/Behavioral: Positive for sleep disturbance (pain keeps him awake at times).    Objective:  BP 111/66   Pulse 70   Temp 97.8 F (36.6 C) (Oral)   Ht 5' 8" (1.727 m)   BP Readings from Last 3 Encounters:  02/14/17 111/66  11/11/16 111/61  10/19/16 114/69    Wt Readings from Last 3 Encounters:  11/28/16 128 lb (58.1 kg)  10/31/16 128 lb (58.1 kg)  10/19/16 128 lb (58.1 kg)     Physical Exam    Assessment & Plan:   Todd [name] was seen today for diabetes.  Diagnoses and all orders for this visit:  Essential hypertension -     CBC with Differential/Platelet -       CMP14+EGFR  Hypothyroidism, unspecified type -     TSH -     T4, Free  Uncontrolled type 2 diabetes mellitus with diabetic nephropathy, with long-term current use of insulin ([facility]) -     Bayer  DCA Hb A1c Waived  Hyperuricemia  Transaminitis  Other orders -     meloxicam (MOBIC) 15 MG tablet; Take 1 tablet (15 mg total) by mouth daily. -     atorvastatin (LIPITOR) 40 MG tablet; Take 1 tablet (40 mg total) by mouth daily at 6 PM. -     carvedilol (COREG) 3.125 MG tablet; Take 1 tablet (3.125 mg total) by mouth 2 (two) times daily with a meal. -     COLCRYS 0.6 MG tablet; Take 1 tablet (0.6 mg total) by mouth 2 (two) times daily as needed.       I have discontinued Todd [name]'s aspirin and acetaminophen. I have also changed his COLCRYS. Additionally, I am having him start on meloxicam. Lastly, I am having him maintain his Insulin Glargine, loperamide, allopurinol, levothyroxine, atorvastatin, and carvedilol.  Allergies as of 02/14/2017      Reactions   Lisinopril Other (See Comments)   Hyperkalemia / Renal failure      Medication List       Accurate as of 02/14/17 11:52 AM. Always use your most recent med list.          allopurinol 100 MG tablet Commonly known as:  ZYLOPRIM Take 1 tablet (100 mg total) by mouth daily.   atorvastatin 40 MG tablet Commonly known as:  LIPITOR Take 1 tablet (40 mg total) by mouth daily at 6 PM.   carvedilol 3.125 MG tablet Commonly known as:  COREG Take 1 tablet (3.125 mg total) by mouth 2 (two) times daily with a meal.   COLCRYS 0.6 MG tablet Generic drug:  colchicine Take 1 tablet (0.6 mg total) by mouth 2 (two) times daily as needed.   Insulin Glargine 100 UNIT/ML Solostar Pen Commonly known as:  LANTUS SOLOSTAR Inject 10 Units into the skin daily.   levothyroxine 100 MCG tablet Commonly known as:  SYNTHROID, LEVOTHROID TAKE 1 TABLET BY MOUTH ONCE DAILY BEFORE BREAKFAST   loperamide 2 MG capsule Commonly known as:  IMODIUM Take 4 mg by mouth as needed for diarrhea or loose stools.   meloxicam 15 MG tablet Commonly known as:  MOBIC Take 1 tablet (15 mg total) by mouth daily.            Discharge Care  Instructions        Start     Ordered   02/14/17 0000  CBC with Differential/Platelet     02/14/17 1135   02/14/17 0000  CMP14+EGFR     02/14/17 1135   02/14/17 0000  TSH     02/14/17 1135   02/14/17 0000  T4, Free     02/14/17 1135   02/14/17 0000  Bayer DCA Hb A1c Waived     02/14/17 1135   02/14/17 0000  meloxicam (MOBIC) 15 MG tablet  Daily     02/14/17 1135   02/14/17 0000  atorvastatin (LIPITOR) 40 MG tablet  Daily-1800     02/14/17 1135   02/14/17 0000  carvedilol (COREG) 3.125 MG tablet  2 times daily with meals     02/14/17 1135   02/14/17 0000  COLCRYS 0.6 MG tablet  2 times daily PRN     02/14/17 1135      I suggested tapering off   the cigarettes by reducing 1-2 cigarettes per day each week until done.  Follow-up: Return in about 3 months (around 05/16/2017).  [name], M.D.

## 2017-02-15 ENCOUNTER — Other Ambulatory Visit: Payer: Self-pay | Admitting: *Deleted

## 2017-02-15 ENCOUNTER — Ambulatory Visit: Payer: BLUE CROSS/BLUE SHIELD | Attending: Family Medicine | Admitting: Physical Therapy

## 2017-02-15 ENCOUNTER — Other Ambulatory Visit: Payer: BLUE CROSS/BLUE SHIELD

## 2017-02-15 DIAGNOSIS — R2681 Unsteadiness on feet: Secondary | ICD-10-CM | POA: Insufficient documentation

## 2017-02-15 DIAGNOSIS — E875 Hyperkalemia: Secondary | ICD-10-CM

## 2017-02-15 DIAGNOSIS — R2689 Other abnormalities of gait and mobility: Secondary | ICD-10-CM | POA: Diagnosis present

## 2017-02-15 DIAGNOSIS — M6249 Contracture of muscle, multiple sites: Secondary | ICD-10-CM

## 2017-02-15 DIAGNOSIS — M6281 Muscle weakness (generalized): Secondary | ICD-10-CM | POA: Diagnosis present

## 2017-02-15 LAB — CMP14+EGFR
ALBUMIN: 4.3 g/dL (ref 3.6–4.8)
ALK PHOS: 143 IU/L — AB (ref 39–117)
ALT: 28 IU/L (ref 0–44)
AST: 36 IU/L (ref 0–40)
Albumin/Globulin Ratio: 1 — ABNORMAL LOW (ref 1.2–2.2)
BILIRUBIN TOTAL: 0.7 mg/dL (ref 0.0–1.2)
BUN/Creatinine Ratio: 15 (ref 10–24)
BUN: 26 mg/dL (ref 8–27)
CHLORIDE: 104 mmol/L (ref 96–106)
CO2: 17 mmol/L — AB (ref 20–29)
CREATININE: 1.77 mg/dL — AB (ref 0.76–1.27)
Calcium: 10.5 mg/dL — ABNORMAL HIGH (ref 8.6–10.2)
GFR calc Af Amer: 47 mL/min/{1.73_m2} — ABNORMAL LOW (ref >59)
GFR calc non Af Amer: 41 mL/min/{1.73_m2} — ABNORMAL LOW (ref >59)
GLOBULIN, TOTAL: 4.2 g/dL (ref 1.5–4.5)
GLUCOSE: 62 mg/dL — AB (ref 65–99)
Potassium: 6.2 mmol/L (ref 3.5–5.2)
SODIUM: 137 mmol/L (ref 134–144)
Total Protein: 8.5 g/dL (ref 6.0–8.5)

## 2017-02-15 LAB — CBC WITH DIFFERENTIAL/PLATELET
BASOS ABS: 0.1 10*3/uL (ref 0.0–0.2)
Basos: 1 %
EOS (ABSOLUTE): 0.5 10*3/uL — ABNORMAL HIGH (ref 0.0–0.4)
Eos: 6 %
HEMOGLOBIN: 17 g/dL (ref 13.0–17.7)
Hematocrit: 49.4 % (ref 37.5–51.0)
Immature Grans (Abs): 0 10*3/uL (ref 0.0–0.1)
Immature Granulocytes: 0 %
LYMPHS ABS: 2.8 10*3/uL (ref 0.7–3.1)
Lymphs: 32 %
MCH: 27.5 pg (ref 26.6–33.0)
MCHC: 34.4 g/dL (ref 31.5–35.7)
MCV: 80 fL (ref 79–97)
MONOS ABS: 0.4 10*3/uL (ref 0.1–0.9)
Monocytes: 4 %
NEUTROS ABS: 5 10*3/uL (ref 1.4–7.0)
Neutrophils: 57 %
Platelets: 419 10*3/uL — ABNORMAL HIGH (ref 150–379)
RBC: 6.19 x10E6/uL — AB (ref 4.14–5.80)
RDW: 16.2 % — AB (ref 12.3–15.4)
WBC: 8.8 10*3/uL (ref 3.4–10.8)

## 2017-02-15 LAB — T4, FREE: FREE T4: 1.79 ng/dL — AB (ref 0.82–1.77)

## 2017-02-15 LAB — TSH: TSH: 0.202 u[IU]/mL — ABNORMAL LOW (ref 0.450–4.500)

## 2017-02-15 NOTE — Therapy (Signed)
[facility] [address] [place], [place], [address] Phone: [phone]   Fax:  [phone]  Physical Therapy Treatment  Patient Details  Name: Todd [name] MRN: [id number] Date of Birth: 1956/03/30 Referring Provider: [name], MD  Encounter Date: 02/15/2017      PT End of Session - 02/15/17 1353    Visit Number 13   Number of Visits 35   Date for PT Re-Evaluation 04/28/17   Authorization Type BCBS primary & Medicaid secondary   PT Start Time 1015   PT Stop Time 1056   PT Time Calculation (min) 41 min   Equipment Utilized During Treatment Gait belt   Activity Tolerance Patient limited by fatigue;Patient tolerated treatment well  Patient report pain at "knot"   Behavior During Therapy [facility] for tasks assessed/performed      Past Medical History:  Diagnosis Date  . AKI (acute kidney injury) ([place])   . Constipated   . Diabetes mellitus without complication ([place])   . Diarrhea   . Elevated LFTs   . Goiter   . Gout   . Hyperlipidemia   . Hypertension   . Leukocytosis   . Reactive thrombocytosis   . Right BKA infection ([place]) 08/2016  . Right leg pain   . Sepsis due to undetermined organism ([place])   . Thyroid disease   . Wound infection after surgery 08/2016    Past Surgical History:  Procedure Laterality Date  . ABDOMINAL AORTOGRAM N/A 08/11/2016   Procedure: Abdominal Aortogram;  Surgeon: Waynetta Sandy, MD;  Location: [place] [facility];  Service: Cardiovascular;  Laterality: N/A;  . ABDOMINAL AORTOGRAM W/LOWER EXTREMITY N/A 08/15/2016   Procedure: Abdominal Aortogram w/Lower Extremity;  Surgeon: Elam Dutch, MD;  Location: [place] [facility];  Service: Cardiovascular;  Laterality: N/A;  . AMPUTATION Right 08/17/2016   Procedure: RIGHT BELOW KNEE AMPUTATION;  Surgeon: Elam Dutch, MD;  Location: [place];  Service: Vascular;  Laterality: Right;  . AMPUTATION Right 09/12/2016   Procedure:  AMPUTATION ABOVE KNEE;  Surgeon: Newt Minion, MD;  Location: [place];  Service: Orthopedics;  Laterality: Right;  . AMPUTATION Left 08/12/2016   Procedure: LEFT BELOW KNEE AMPUTATION;  Surgeon: Newt Minion, MD;  Location: [place];  Service: Orthopedics;  Laterality: Left;  . APPLICATION OF WOUND VAC Right 09/12/2016   Procedure: APPLICATION OF WOUND VAC ABOVE KNEE;  Surgeon: Newt Minion, MD;  Location: [facility];  Service: Orthopedics;  Laterality: Right;  . LOWER EXTREMITY ANGIOGRAPHY Bilateral 08/11/2016   Procedure: Lower Extremity Angiography;  Surgeon: Waynetta Sandy, MD;  Location: [place] [facility];  Service: Cardiovascular;  Laterality: Bilateral;  . PERIPHERAL VASCULAR BALLOON ANGIOPLASTY Left 08/11/2016   Procedure: Peripheral Vascular Balloon Angioplasty;  Surgeon: Waynetta Sandy, MD;  Location: [place] [facility];  Service: Cardiovascular;  Laterality: Left;  SFA  . THYROID SURGERY      There were no vitals filed for this visit.                       [facility] Adult PT Treatment/Exercise - 02/15/17 1015      Transfers   Transfers Stand to Sit;Sit to Stand   Sit to Stand 3: Mod assist;4: Min assist;With upper extremity assist;With armrests;From chair/3-in-1  to RW   Sit to Stand Details Verbal cues for technique;Manual facilitation for weight shifting;Verbal cues for safe use of DME/AE   Sit to Stand Details (indicate  cue type and reason) cues on wt shift and TFA control   Stand to Sit 4: Min guard;5: Supervision;With upper extremity assist;With armrests;To chair/3-in-1  from RW   Stand to Sit Details (indicate cue type and reason) Verbal cues for technique;Verbal cues for sequencing;Verbal cues for safe use of DME/AE   Stand to Sit Details verbal cues on TFA prosthesis     Ambulation/Gait   Ambulation/Gait Yes   Ambulation/Gait Assistance 4: Min assist  second person for safety but SBA only   Ambulation/Gait Assistance Details Verbal & tactile cues  on sequence, upright posture & step length.    Ambulation Distance (Feet) 66 Feet  45' & 30' including turns   Assistive device Rolling walker;Prostheses   Gait Pattern Step-through pattern;Decreased step length - left;Decreased stride length;Decreased stance time - right;Decreased weight shift to right;Right circumduction;Right hip hike;Antalgic;Left flexed knee in stance;Right flexed knee in stance;Wide base of support;Trunk flexed;Poor foot clearance - left   Ambulation Surface Indoor;Level   Stairs --   Stairs Assistance --   Stairs Assistance Details (indicate cue type and reason) PT instructed in technique with 2 rails with TFA & TTA prostheses but pt reports too fatigued today.    Pre-Gait Activities 4" block step-up with RW 2 reps as precursor for curbs & stairs.      Prosthetics   Current prosthetic wear tolerance (days/week)  daily   Current prosthetic wear tolerance (#hours/day)  5-6 hrs 2x/day TTA prosthesis & TFA liner   Residual limb condition  No open areas. Right TTA bone spur at distal tibia. PT recommended relief in prosthesis to accommodate.    Education Provided Residual limb care;Correct ply sock adjustment;Proper wear schedule/adjustment;Proper weight-bearing schedule/adjustment;Proper Donning;Proper Doffing   Person(s) Educated Patient   Education Method Explanation;Verbal cues   Education Method Verbalized understanding;Needs further instruction;Verbal cues required                  PT Short Term Goals - 01/30/17 2317      PT SHORT TERM GOAL #2   Title Patient tolerates wear of TTA prosthesis >8 hrs total /day & TFA prosthesis >2 hrs /day without skin issues.    Baseline 01/26/2017  Pt tolerates wear of TTA prosthesis & TFA liner 2 hrs 2x/day. Distal tibia pain reduced over last week.   Continue STG   Time 1   Period Months   Status On-going   Target Date 02/24/17     PT SHORT TERM GOAL #4   Title Patient reaches 5" anteriorly with RW support with  prostheses with supervision.    Baseline 01/26/2017:  Patient reaches 5" anteriorly with RW support with prostheses with minA.  Continue STG   Time 1   Period Months   Status On-going   Target Date 02/24/17     PT SHORT TERM GOAL #6   Title Patient performs stand-pivot transfer with RW & prostheses between 2 chairs with armrest with minA.    Time 4   Period Weeks   Status On-going   Target Date 02/24/17     PT SHORT TERM GOAL #7   Title Patient ambulates 60' with RW & prostheses with modA of 1 person.    Time 4   Period Weeks   Status On-going   Target Date 02/24/17           PT Long Term Goals - 01/12/17 1924      PT LONG TERM GOAL #1   Title Patient verbalizes and demonstrates understanding  of prosthetic care to enable safe use of prostheses.  (Target Date: 04/28/2017)   Time 4   Period Months   Status On-going     PT LONG TERM GOAL #2   Title Patient tolerates wear of TTA prosthesis >90% and TFA >75% of awake hours to enable function throughout his day. (Target Date: 04/28/2017)   Time 4   Period Months   Status On-going     PT LONG TERM GOAL #3   Title Patient performs transfers including sit to/from stand and stand pivot transfers with LRAD & prostheses modified independent.  (Target Date: 04/28/2017)   Time 4   Period Months   Status On-going     PT LONG TERM GOAL #4   Title Patient performs standing balance with LRAD & prostheses reaching 10" anteriorly, reaching to floor, looking over shoulders and managing clothes modified independent.  (Target Date: 04/28/2017)   Time 4   Period Months   Status On-going     PT LONG TERM GOAL #5   Title Patient ambulates 300' with LRAD & prostheses modified independent to enable community mobility.  (Target Date: 04/28/2017)   Time 4   Period Months   Status On-going     PT LONG TERM GOAL #6   Title Patient negotiates ramps, curbs & stairs with LRAD & prostheses modified independent to enable community access.   (Target Date: 04/28/2017)   Time 4   Period Months   Status On-going     PT LONG TERM GOAL #7   Title Activities of Balance Confidence score >15%.  (Target Date: 04/28/2017)   Baseline Initial ABC 0.0%   Time 4   Period Months   Status On-going               Plan - 02/15/17 1353    Clinical Impression Statement Sit to stand continues to be most difficult portion of gait. He lives alone & struggles with being able to practice at home which slows progress. His gait has improved and 2nd person did not have to assist today. Patient is increasing wear of TTA prosthesis without limb pain.    Rehab Potential Good   PT Frequency 2x / week   PT Duration Other (comment)  4 months   PT Treatment/Interventions ADLs/Self Care Home Management;DME Instruction;Gait training;Stair training;Functional mobility training;Therapeutic activities;Therapeutic exercise;Balance training;Neuromuscular re-education;Patient/family education;Prosthetic Training;Manual techniques   PT Next Visit Plan Prosthetic gait with RW work on turning to sit in a chair with armrests, Try curb outdoors or aerobic steps for modified curb again.    Consulted and Agree with Plan of Care Patient      Patient will benefit from skilled therapeutic intervention in order to improve the following deficits and impairments:  Abnormal gait, Decreased activity tolerance, Decreased balance, Decreased endurance, Decreased knowledge of use of DME, Decreased mobility, Decreased range of motion, Decreased skin integrity, Decreased strength, Postural dysfunction, Prosthetic Dependency, Pain  Visit Diagnosis: Unsteadiness on feet  Muscle weakness (generalized)  Contracture of muscle, multiple sites     Problem List Patient Active Problem List   Diagnosis Date Noted  . History of right above knee amputation ([place]) 09/21/2016  . Acquired absence of left leg below knee ([place]) 09/21/2016  . Transaminitis   . Diabetic peripheral  neuropathy ([place]) 07/21/2016  . Renal failure 07/04/2016  . Hyperuricemia 06/21/2016  . HTN (hypertension) 01/05/2016  . Diabetes type 2, uncontrolled ([place]) 01/05/2016  . Hypothyroidism 05/10/2011    Brooklyne Radke PT, DPT 02/15/2017, 1:56 PM  [facility] [address] [place], [place], [address] Phone: [phone]   Fax:  [phone]  Name: Todd [name] MRN: [id number] Date of Birth: 11-25-1955

## 2017-02-16 ENCOUNTER — Ambulatory Visit: Payer: BLUE CROSS/BLUE SHIELD | Admitting: Physical Therapy

## 2017-02-16 ENCOUNTER — Encounter: Payer: BLUE CROSS/BLUE SHIELD | Admitting: Physical Therapy

## 2017-02-16 ENCOUNTER — Encounter: Payer: Self-pay | Admitting: Physical Therapy

## 2017-02-16 DIAGNOSIS — R2681 Unsteadiness on feet: Secondary | ICD-10-CM

## 2017-02-16 DIAGNOSIS — M6281 Muscle weakness (generalized): Secondary | ICD-10-CM

## 2017-02-16 DIAGNOSIS — R2689 Other abnormalities of gait and mobility: Secondary | ICD-10-CM

## 2017-02-16 LAB — BMP8+EGFR
BUN / CREAT RATIO: 16 (ref 10–24)
BUN: 24 mg/dL (ref 8–27)
CO2: 11 mmol/L — ABNORMAL LOW (ref 20–29)
CREATININE: 1.47 mg/dL — AB (ref 0.76–1.27)
Calcium: 10.2 mg/dL (ref 8.6–10.2)
Chloride: 104 mmol/L (ref 96–106)
GFR, EST AFRICAN AMERICAN: 59 mL/min/{1.73_m2} — AB (ref >59)
GFR, EST NON AFRICAN AMERICAN: 51 mL/min/{1.73_m2} — AB (ref >59)
Glucose: 103 mg/dL — ABNORMAL HIGH (ref 65–99)
Potassium: 4.9 mmol/L (ref 3.5–5.2)
SODIUM: 134 mmol/L (ref 134–144)

## 2017-02-19 NOTE — Therapy (Signed)
[facility] [address] [place], [place], [address] Phone: [phone]   Fax:  [phone]  Physical Therapy Treatment  Patient Details  Name: Todd [name] MRN: [id number] Date of Birth: 25-Jul-1955 Referring Provider: [name], MD  Encounter Date: 02/16/2017   02/16/17 1155  PT Visits / Re-Eval  Visit Number 14  Number of Visits 35  Date for PT Re-Evaluation 04/28/17  Authorization  Authorization Type BCBS primary & Medicaid secondary  PT Time Calculation  PT Start Time 1150  PT Stop Time 1230  PT Time Calculation (min) 40 min  PT - End of Session  Equipment Utilized During Treatment Gait belt  Activity Tolerance Patient limited by fatigue;Patient tolerated treatment well (Patient report pain at "knot")  Behavior During Therapy [facility] for tasks assessed/performed     Past Medical History:  Diagnosis Date  . AKI (acute kidney injury) ([place])   . Constipated   . Diabetes mellitus without complication ([place])   . Diarrhea   . Elevated LFTs   . Goiter   . Gout   . Hyperlipidemia   . Hypertension   . Leukocytosis   . Reactive thrombocytosis   . Right BKA infection ([place]) 08/2016  . Right leg pain   . Sepsis due to undetermined organism ([place])   . Thyroid disease   . Wound infection after surgery 08/2016    Past Surgical History:  Procedure Laterality Date  . ABDOMINAL AORTOGRAM N/A 08/11/2016   Procedure: Abdominal Aortogram;  Surgeon: Waynetta Sandy, MD;  Location: [place] [facility];  Service: Cardiovascular;  Laterality: N/A;  . ABDOMINAL AORTOGRAM W/LOWER EXTREMITY N/A 08/15/2016   Procedure: Abdominal Aortogram w/Lower Extremity;  Surgeon: Elam Dutch, MD;  Location: [place] [facility];  Service: Cardiovascular;  Laterality: N/A;  . AMPUTATION Right 08/17/2016   Procedure: RIGHT BELOW KNEE AMPUTATION;  Surgeon: Elam Dutch, MD;  Location: [facility];  Service: Vascular;  Laterality: Right;   . AMPUTATION Right 09/12/2016   Procedure: AMPUTATION ABOVE KNEE;  Surgeon: Newt Minion, MD;  Location: [facility];  Service: Orthopedics;  Laterality: Right;  . AMPUTATION Left 08/12/2016   Procedure: LEFT BELOW KNEE AMPUTATION;  Surgeon: Newt Minion, MD;  Location: [place];  Service: Orthopedics;  Laterality: Left;  . APPLICATION OF WOUND VAC Right 09/12/2016   Procedure: APPLICATION OF WOUND VAC ABOVE KNEE;  Surgeon: Newt Minion, MD;  Location: [place];  Service: Orthopedics;  Laterality: Right;  . LOWER EXTREMITY ANGIOGRAPHY Bilateral 08/11/2016   Procedure: Lower Extremity Angiography;  Surgeon: Waynetta Sandy, MD;  Location: [place] [facility];  Service: Cardiovascular;  Laterality: Bilateral;  . PERIPHERAL VASCULAR BALLOON ANGIOPLASTY Left 08/11/2016   Procedure: Peripheral Vascular Balloon Angioplasty;  Surgeon: Waynetta Sandy, MD;  Location: [place] [facility];  Service: Cardiovascular;  Laterality: Left;  SFA  . THYROID SURGERY      There were no vitals filed for this visit.   02/16/17 1152  Symptoms/Limitations  Subjective No new complaints. No falls to report. Reports he had to go for lab work yesterday to repeat a test because something came back elevated, unsure which lab it was.  Patient is accompained by: Family member  Pertinent History R  TFA 09/12/2016, L TTA 08/12/2016, gout, HTN, DM, Neuropathy, renal failure  Limitations Other (comment)  Patient Stated Goals Use prostheses to get back into community. Get out of w/c.  Pain Assessment  Currently in Pain? No/denies  Pain Score 0  02/16/17 1155  Transfers  Transfers Stand to Sit;Sit to Stand  Sit to Stand 4: Min assist;With upper extremity assist;From chair/3-in-1  Sit to Stand Details Verbal cues for technique;Manual facilitation for weight shifting;Verbal cues for safe use of DME/AE  Sit to Stand Details (indicate cue type and reason) reminder cues on technique  Stand to Sit 4: Min guard;With upper  extremity assist;To chair/3-in-1  Stand to Sit Details (indicate cue type and reason) Verbal cues for technique;Verbal cues for sequencing;Verbal cues for safe use of DME/AE  Stand to Sit Details cues for prosthetic foot placement to assist with unweighting with sitting down  Ambulation/Gait  Ambulation/Gait Yes  Ambulation/Gait Assistance 4: Min assist;3: Mod assist (mod assist for last 5 feet before pt stated he was done)  Ambulation/Gait Assistance Details multimodal cues for sequencing, step length and step placement with gait. incr assistance needed as gait distance progressed.   Ambulation Distance (Feet) 23 Feet  Assistive device Rolling walker;Prostheses  Gait Pattern Step-through pattern;Decreased step length - left;Decreased stride length;Decreased stance time - right;Decreased weight shift to right;Right circumduction;Right hip hike;Antalgic;Left flexed knee in stance;Right flexed knee in stance;Wide base of support;Trunk flexed;Poor foot clearance - left  Ambulation Surface Level;Indoor  Stairs Yes  Stairs Assistance 3: Mod assist (with 2 people for safety)  Stairs Assistance Details (indicate cue type and reason) demo'd prior to pt performance with cues on sequencing and weight shifting during pt performance. seated rest break taken at top of stairs on stool prior to descending stairs   Stair Management Technique Two rails;Step to pattern;Forwards  Number of Stairs 4  Height of Stairs 6            PT Short Term Goals - 01/30/17 2317      PT SHORT TERM GOAL #2   Title Patient tolerates wear of TTA prosthesis >8 hrs total /day & TFA prosthesis >2 hrs /day without skin issues.    Baseline 01/26/2017  Pt tolerates wear of TTA prosthesis & TFA liner 2 hrs 2x/day. Distal tibia pain reduced over last week.   Continue STG   Time 1   Period Months   Status On-going   Target Date 02/24/17     PT SHORT TERM GOAL #4   Title Patient reaches 5" anteriorly with RW support with  prostheses with supervision.    Baseline 01/26/2017:  Patient reaches 5" anteriorly with RW support with prostheses with minA.  Continue STG   Time 1   Period Months   Status On-going   Target Date 02/24/17     PT SHORT TERM GOAL #6   Title Patient performs stand-pivot transfer with RW & prostheses between 2 chairs with armrest with minA.    Time 4   Period Weeks   Status On-going   Target Date 02/24/17     PT SHORT TERM GOAL #7   Title Patient ambulates 45' with RW & prostheses with modA of 1 person.    Time 4   Period Weeks   Status On-going   Target Date 02/24/17           PT Long Term Goals - 01/12/17 1924      PT LONG TERM GOAL #1   Title Patient verbalizes and demonstrates understanding of prosthetic care to enable safe use of prostheses.  (Target Date: 04/28/2017)   Time 4   Period Months   Status On-going     PT LONG TERM GOAL #2   Title Patient tolerates wear of TTA  prosthesis >90% and TFA >75% of awake hours to enable function throughout his day. (Target Date: 04/28/2017)   Time 4   Period Months   Status On-going     PT LONG TERM GOAL #3   Title Patient performs transfers including sit to/from stand and stand pivot transfers with LRAD & prostheses modified independent.  (Target Date: 04/28/2017)   Time 4   Period Months   Status On-going     PT LONG TERM GOAL #4   Title Patient performs standing balance with LRAD & prostheses reaching 10" anteriorly, reaching to floor, looking over shoulders and managing clothes modified independent.  (Target Date: 04/28/2017)   Time 4   Period Months   Status On-going     PT LONG TERM GOAL #5   Title Patient ambulates 300' with LRAD & prostheses modified independent to enable community mobility.  (Target Date: 04/28/2017)   Time 4   Period Months   Status On-going     PT LONG TERM GOAL #6   Title Patient negotiates ramps, curbs & stairs with LRAD & prostheses modified independent to enable community access.   (Target Date: 04/28/2017)   Time 4   Period Months   Status On-going     PT LONG TERM GOAL #7   Title Activities of Balance Confidence score >15%.  (Target Date: 04/28/2017)   Baseline Initial ABC 0.0%   Time 4   Period Months   Status On-going        02/16/17 1155  Plan  Clinical Impression Statement Today's skilled session initiated stair education and gait with RW/prostheses. Pt continues to fatigue quickly, therefore continues to need 2 person assist for safety. Pt is progressing toward goals and should benefit from continued PT to progress toward unmet goals.  Pt will benefit from skilled therapeutic intervention in order to improve on the following deficits Abnormal gait;Decreased activity tolerance;Decreased balance;Decreased endurance;Decreased knowledge of use of DME;Decreased mobility;Decreased range of motion;Decreased skin integrity;Decreased strength;Postural dysfunction;Prosthetic Dependency;Pain  Rehab Potential Good  PT Frequency 2x / week  PT Duration Other (comment) (4 months)  PT Treatment/Interventions ADLs/Self Care Home Management;DME Instruction;Gait training;Stair training;Functional mobility training;Therapeutic activities;Therapeutic exercise;Balance training;Neuromuscular re-education;Patient/family education;Prosthetic Training;Manual techniques  PT Next Visit Plan Prosthetic gait with RW work on turning to sit in a chair with armrests, Try curb outdoors or aerobic steps for modified curb again.   Consulted and Agree with Plan of Care Patient          Patient will benefit from skilled therapeutic intervention in order to improve the following deficits and impairments:  Abnormal gait, Decreased activity tolerance, Decreased balance, Decreased endurance, Decreased knowledge of use of DME, Decreased mobility, Decreased range of motion, Decreased skin integrity, Decreased strength, Postural dysfunction, Prosthetic Dependency, Pain  Visit  Diagnosis: Unsteadiness on feet  Muscle weakness (generalized)  Other abnormalities of gait and mobility     Problem List Patient Active Problem List   Diagnosis Date Noted  . History of right above knee amputation ([place]) 09/21/2016  . Acquired absence of left leg below knee ([place]) 09/21/2016  . Transaminitis   . Diabetic peripheral neuropathy ([place]) 07/21/2016  . Renal failure 07/04/2016  . Hyperuricemia 06/21/2016  . HTN (hypertension) 01/05/2016  . Diabetes type 2, uncontrolled ([place]) 01/05/2016  . Hypothyroidism 05/10/2011    Willow Ora, PTA, [facility] [address], [place], [place] [address] [phone] 02/19/17, 10:46 PM   Name: Todd [name] MRN: [id number] Date of Birth: August 26, 1955

## 2017-02-21 ENCOUNTER — Encounter: Payer: Self-pay | Admitting: Physical Therapy

## 2017-02-21 ENCOUNTER — Ambulatory Visit: Payer: BLUE CROSS/BLUE SHIELD | Admitting: Physical Therapy

## 2017-02-21 DIAGNOSIS — R2681 Unsteadiness on feet: Secondary | ICD-10-CM | POA: Diagnosis not present

## 2017-02-21 DIAGNOSIS — M6281 Muscle weakness (generalized): Secondary | ICD-10-CM

## 2017-02-21 DIAGNOSIS — R2689 Other abnormalities of gait and mobility: Secondary | ICD-10-CM

## 2017-02-21 NOTE — Therapy (Addendum)
[facility] [address] [place], [place], [address] Phone: [phone]   Fax:  [phone]  Physical Therapy Treatment  Patient Details  Name: Todd [name] MRN: [id number] Date of Birth: 05/30/1956 Referring Provider: [name], MD  Encounter Date: 02/21/2017      PT End of Session - 02/21/17 1019    Visit Number 15   Number of Visits 35   Date for PT Re-Evaluation 04/28/17   Authorization Type BCBS primary & Medicaid secondary   PT Start Time 1015   PT Stop Time 1100   PT Time Calculation (min) 45 min   Equipment Utilized During Treatment Gait belt   Activity Tolerance Patient limited by fatigue;Patient tolerated treatment well  Patient report pain at "knot"   Behavior During Therapy [facility] for tasks assessed/performed      Past Medical History:  Diagnosis Date  . AKI (acute kidney injury) ([place])   . Constipated   . Diabetes mellitus without complication ([place])   . Diarrhea   . Elevated LFTs   . Goiter   . Gout   . Hyperlipidemia   . Hypertension   . Leukocytosis   . Reactive thrombocytosis   . Right BKA infection ([place]) 08/2016  . Right leg pain   . Sepsis due to undetermined organism ([place])   . Thyroid disease   . Wound infection after surgery 08/2016    Past Surgical History:  Procedure Laterality Date  . ABDOMINAL AORTOGRAM N/A 08/11/2016   Procedure: Abdominal Aortogram;  Surgeon: Waynetta Sandy, MD;  Location: [place];  Service: Cardiovascular;  Laterality: N/A;  . ABDOMINAL AORTOGRAM W/LOWER EXTREMITY N/A 08/15/2016   Procedure: Abdominal Aortogram w/Lower Extremity;  Surgeon: Elam Dutch, MD;  Location: [place] [facility];  Service: Cardiovascular;  Laterality: N/A;  . AMPUTATION Right 08/17/2016   Procedure: RIGHT BELOW KNEE AMPUTATION;  Surgeon: Elam Dutch, MD;  Location: [facility];  Service: Vascular;  Laterality: Right;  . AMPUTATION Right 09/12/2016   Procedure:  AMPUTATION ABOVE KNEE;  Surgeon: Newt Minion, MD;  Location: [facility];  Service: Orthopedics;  Laterality: Right;  . AMPUTATION Left 08/12/2016   Procedure: LEFT BELOW KNEE AMPUTATION;  Surgeon: Newt Minion, MD;  Location: [facility];  Service: Orthopedics;  Laterality: Left;  . APPLICATION OF WOUND VAC Right 09/12/2016   Procedure: APPLICATION OF WOUND VAC ABOVE KNEE;  Surgeon: Newt Minion, MD;  Location: [facility];  Service: Orthopedics;  Laterality: Right;  . LOWER EXTREMITY ANGIOGRAPHY Bilateral 08/11/2016   Procedure: Lower Extremity Angiography;  Surgeon: Waynetta Sandy, MD;  Location: [facility];  Service: Cardiovascular;  Laterality: Bilateral;  . PERIPHERAL VASCULAR BALLOON ANGIOPLASTY Left 08/11/2016   Procedure: Peripheral Vascular Balloon Angioplasty;  Surgeon: Waynetta Sandy, MD;  Location: [facility];  Service: Cardiovascular;  Laterality: Left;  SFA  . THYROID SURGERY      There were no vitals filed for this visit.      Subjective Assessment - 02/21/17 1018    Subjective No new complaints. No falls to report. Going to [employer] today after PT for adjustments.    Patient is accompained by: Family member   Pertinent History R  TFA 09/12/2016, L TTA 08/12/2016, gout, HTN, DM, Neuropathy, renal failure   Patient Stated Goals Use prostheses to get back into community. Get out of w/c.   Currently in Pain? No/denies   Pain Score 0-No pain  [facility] Adult PT Treatment/Exercise - 02/21/17 1021      Transfers   Transfers Sit to Stand;Stand to [employer]   Sit to Stand 4: Min assist;With upper extremity assist;From chair/3-in-1   Sit to Stand Details Verbal cues for precautions/safety;Verbal cues for sequencing;Verbal cues for technique   Sit to Stand Details (indicate cue type and reason) reminder cues to scoot to edge of chair surface before standing   Stand to Sit 4: Min guard;With upper extremity assist;To chair/3-in-1   Stand to Sit  Details (indicate cue type and reason) Verbal cues for sequencing;Verbal cues for technique;Verbal cues for precautions/safety   Stand to Sit Details pt with controlled descent each time with min guard assist for safety. Pt able to self unlock prosthesis with unweighting as well.    Stand Pivot Transfers 4: Min assist   Stand Pivot Transfer Details (indicate cue type and reason) wheelchair<>standing chair with arm rests   Comments standing with RW and min assist pt was able to reach just at 3 inches forward out of base of support.      Ambulation/Gait   Ambulation/Gait Yes   Ambulation/Gait Assistance 4: Min assist  with second person for safety   Ambulation/Gait Assistance Details cues on posture and sequencing. manual guarding of prosthetic knee, however no assist was needed.    Ambulation Distance (Feet) 27 Feet   Assistive device Rolling walker;Prostheses   Gait Pattern Step-through pattern;Decreased step length - left;Decreased stride length;Decreased stance time - right;Decreased weight shift to right;Right circumduction;Right hip hike;Antalgic;Left flexed knee in stance;Right flexed knee in stance;Wide base of support;Trunk flexed;Poor foot clearance - left   Ambulation Surface Level;Indoor     Prosthetics   Prosthetic Care Comments  had pt don bil prostheses as much as he could without assistance. pt able to fully don bil liners and socks without assistance. incr assistance needed to done BKA prosthesis due to limb swelling (?), min assist to don AKA prosthesis   Current prosthetic wear tolerance (days/week)  daily   Current prosthetic wear tolerance (#hours/day)  TFA- wearing liner 4-5 hours 2x day, TTA- wearing liner consistently 4-5 hours 2x day, admits to not wearing prosthesis "much as possible."    Residual limb condition  No open areas. Right TTA bone spur at distal tibia. PT recommended relief in prosthesis to accommodate.    Education Provided Proper wear  schedule/adjustment;Proper weight-bearing schedule/adjustment;Residual limb care;Proper Donning   Person(s) Educated Patient;Caregiver(s)  uncle   Education Method Explanation;Demonstration;Tactile cues   Education Method Verbalized understanding;Verbal cues required;Needs further instruction   Donning Prosthesis Supervision;Minimal assist   Doffing Prosthesis Supervision             PT Short Term Goals - 02/21/17 1020      PT SHORT TERM GOAL #1   Title Patient donnes TTA prosthesis modified independent and TFA prosthesis with minA. (Target Date: 01/27/2017)   Baseline MET 01/24/2017   Status Achieved     PT SHORT TERM GOAL #2   Title Patient tolerates wear of TTA prosthesis >8 hrs total /day & TFA prosthesis >2 hrs /day without skin issues.    Baseline 02/21/17: pt is not consistently wearing TTA prosthesis at home   Time --   Period --   Status Not Met     PT SHORT TERM GOAL #3   Title Sit to /from stand w/c to RW with minA with prostheses.    Baseline 02/21/17: met today   Time --   Period Months  Status Achieved     PT SHORT TERM GOAL #4   Title Patient reaches 5" anteriorly with RW support with prostheses with supervision.    Baseline 02/21/17: pt can reach 3 inches forward out of base of support with RW, min guard assist   Time --   Period --   Status Not Met     PT SHORT TERM GOAL #5   Title Patient ambulates 49' with RW & prostheses with modA.     Baseline 02/21/17: see gait goal below for updated gait goal   Status Deferred     PT SHORT TERM GOAL #6   Title Patient performs stand-pivot transfer with RW & prostheses between 2 chairs with armrest with minA.    Baseline 02/21/17: met today   Time --   Period --   Status Achieved     PT SHORT TERM GOAL #7   Title Patient ambulates 14' with RW & prostheses with modA of 1 person.    Baseline 02/21/17: pt has met this distance in past with 1 person hands on assist and second person for safety   Time --   Period  --   Status Partially Met           PT Long Term Goals - 01/12/17 1924      PT LONG TERM GOAL #1   Title Patient verbalizes and demonstrates understanding of prosthetic care to enable safe use of prostheses.  (Target Date: 04/28/2017)   Time 4   Period Months   Status On-going     PT LONG TERM GOAL #2   Title Patient tolerates wear of TTA prosthesis >90% and TFA >75% of awake hours to enable function throughout his day. (Target Date: 04/28/2017)   Time 4   Period Months   Status On-going     PT LONG TERM GOAL #3   Title Patient performs transfers including sit to/from stand and stand pivot transfers with LRAD & prostheses modified independent.  (Target Date: 04/28/2017)   Time 4   Period Months   Status On-going     PT LONG TERM GOAL #4   Title Patient performs standing balance with LRAD & prostheses reaching 10" anteriorly, reaching to floor, looking over shoulders and managing clothes modified independent.  (Target Date: 04/28/2017)   Time 4   Period Months   Status On-going     PT LONG TERM GOAL #5   Title Patient ambulates 300' with LRAD & prostheses modified independent to enable community mobility.  (Target Date: 04/28/2017)   Time 4   Period Months   Status On-going     PT LONG TERM GOAL #6   Title Patient negotiates ramps, curbs & stairs with LRAD & prostheses modified independent to enable community access.  (Target Date: 04/28/2017)   Time 4   Period Months   Status On-going     PT LONG TERM GOAL #7   Title Activities of Balance Confidence score >15%.  (Target Date: 04/28/2017)   Baseline Initial ABC 0.0%   Time 4   Period Months   Status On-going             Plan - 02/21/17 1019    Clinical Impression Statement Today's skilled session focused on progress toward STGs with 3 met, 1 partially met and remainder not met. Pt should benefit from continued PT to progress toward unmet goals.             Rehab Potential Good   PT  Frequency 2x / week    PT Duration Other (comment)  4 months   PT Treatment/Interventions ADLs/Self Care Home Management;DME Instruction;Gait training;Stair training;Functional mobility training;Therapeutic activities;Therapeutic exercise;Balance training;Neuromuscular re-education;Patient/family education;Prosthetic Training;Manual techniques   PT Next Visit Plan Prosthetic gait with RW work on turning to sit in a chair with armrests, Try curb outdoors or aerobic steps for modified curb again.    Consulted and Agree with Plan of Care Patient      Patient will benefit from skilled therapeutic intervention in order to improve the following deficits and impairments:  Abnormal gait, Decreased activity tolerance, Decreased balance, Decreased endurance, Decreased knowledge of use of DME, Decreased mobility, Decreased range of motion, Decreased skin integrity, Decreased strength, Postural dysfunction, Prosthetic Dependency, Pain  Visit Diagnosis: Unsteadiness on feet  Muscle weakness (generalized)  Other abnormalities of gait and mobility     Problem List Patient Active Problem List   Diagnosis Date Noted  . History of right above knee amputation ([place]) 09/21/2016  . Acquired absence of left leg below knee ([place]) 09/21/2016  . Transaminitis   . Diabetic peripheral neuropathy ([place]) 07/21/2016  . Renal failure 07/04/2016  . Hyperuricemia 06/21/2016  . HTN (hypertension) 01/05/2016  . Diabetes type 2, uncontrolled ([place]) 01/05/2016  . Hypothyroidism 05/10/2011    Willow Ora, PTA, [facility] [address], [place], [place] [address] [phone] 02/21/17, 12:15 PM   Name: Malikhi Ogan MRN: [id number] Date of Birth: 1955/07/25      PT Short Term Goals - 02/22/17 1210      PT SHORT TERM GOAL #1   Title Patient verbalizes proper cleaning of prostheses.    Time 4   Period Weeks   Status New   Target Date 03/24/17     PT SHORT TERM GOAL #2   Title Patient tolerates  wear of TTA prosthesis >8 hrs total /day & TFA prosthesis >2 hrs /day without skin issues.    Time 4   Period Weeks   Status On-going   Target Date 03/24/17     PT SHORT TERM GOAL #3   Title Sit to /from stand w/c to RW with supervision with prostheses.    Time 4   Period Weeks   Status New   Target Date 03/24/17     PT SHORT TERM GOAL #4   Title Patient reaches 5" anteriorly with RW support with prostheses with supervision.    Time 4   Period Weeks   Status On-going   Target Date 03/24/17     PT SHORT TERM GOAL #5   Title Patient ambulates 34' with RW & prostheses with modA of 1 person only.    Time 4   Period Weeks   Status New   Target Date 03/24/17     Jamey Reas, PT, DPT PT Specializing in [place] 02/22/17 12:15 PM Phone:  [phone]  Fax:  [phone] [facility] [address] [place], [place] [address]

## 2017-02-23 ENCOUNTER — Encounter: Payer: Self-pay | Admitting: Physical Therapy

## 2017-02-23 ENCOUNTER — Ambulatory Visit: Payer: BLUE CROSS/BLUE SHIELD | Admitting: Physical Therapy

## 2017-02-23 DIAGNOSIS — M6249 Contracture of muscle, multiple sites: Secondary | ICD-10-CM

## 2017-02-23 DIAGNOSIS — M6281 Muscle weakness (generalized): Secondary | ICD-10-CM

## 2017-02-23 DIAGNOSIS — R2689 Other abnormalities of gait and mobility: Secondary | ICD-10-CM

## 2017-02-23 DIAGNOSIS — R2681 Unsteadiness on feet: Secondary | ICD-10-CM

## 2017-02-24 NOTE — Therapy (Signed)
[facility] [address] [place], [place], [address] Phone: [phone]   Fax:  [phone]  Physical Therapy Treatment  Patient Details  Name: Todd [name] MRN: [id number] Date of Birth: May 07, 1956 Referring Provider: [name], MD  Encounter Date: 02/23/2017      PT End of Session - 02/23/17 0076    Visit Number 16   Number of Visits 35   Date for PT Re-Evaluation 04/28/17   Authorization Type BCBS primary & Medicaid secondary   PT Start Time 0950   PT Stop Time 1030   PT Time Calculation (min) 40 min   Equipment Utilized During Treatment Gait belt   Activity Tolerance Patient limited by fatigue;Patient tolerated treatment well  Patient report pain at "knot"   Behavior During Therapy [facility] for tasks assessed/performed      Past Medical History:  Diagnosis Date  . AKI (acute kidney injury) ([place])   . Constipated   . Diabetes mellitus without complication ([place])   . Diarrhea   . Elevated LFTs   . Goiter   . Gout   . Hyperlipidemia   . Hypertension   . Leukocytosis   . Reactive thrombocytosis   . Right BKA infection ([place]) 08/2016  . Right leg pain   . Sepsis due to undetermined organism ([place])   . Thyroid disease   . Wound infection after surgery 08/2016    Past Surgical History:  Procedure Laterality Date  . ABDOMINAL AORTOGRAM N/A 08/11/2016   Procedure: Abdominal Aortogram;  Surgeon: Waynetta Sandy, MD;  Location: [facility];  Service: Cardiovascular;  Laterality: N/A;  . ABDOMINAL AORTOGRAM W/LOWER EXTREMITY N/A 08/15/2016   Procedure: Abdominal Aortogram w/Lower Extremity;  Surgeon: Elam Dutch, MD;  Location: [facility];  Service: Cardiovascular;  Laterality: N/A;  . AMPUTATION Right 08/17/2016   Procedure: RIGHT BELOW KNEE AMPUTATION;  Surgeon: Elam Dutch, MD;  Location: [facility];  Service: Vascular;  Laterality: Right;  . AMPUTATION Right 09/12/2016   Procedure:  AMPUTATION ABOVE KNEE;  Surgeon: Newt Minion, MD;  Location: [facility];  Service: Orthopedics;  Laterality: Right;  . AMPUTATION Left 08/12/2016   Procedure: LEFT BELOW KNEE AMPUTATION;  Surgeon: Newt Minion, MD;  Location: [facility];  Service: Orthopedics;  Laterality: Left;  . APPLICATION OF WOUND VAC Right 09/12/2016   Procedure: APPLICATION OF WOUND VAC ABOVE KNEE;  Surgeon: Newt Minion, MD;  Location: [facility] [place];  Service: Orthopedics;  Laterality: Right;  . LOWER EXTREMITY ANGIOGRAPHY Bilateral 08/11/2016   Procedure: Lower Extremity Angiography;  Surgeon: Waynetta Sandy, MD;  Location: [facility];  Service: Cardiovascular;  Laterality: Bilateral;  . PERIPHERAL VASCULAR BALLOON ANGIOPLASTY Left 08/11/2016   Procedure: Peripheral Vascular Balloon Angioplasty;  Surgeon: Waynetta Sandy, MD;  Location: [facility];  Service: Cardiovascular;  Laterality: Left;  SFA  . THYROID SURGERY      There were no vitals filed for this visit.      Subjective Assessment - 02/23/17 0949    Subjective Patient reports wearing TTA prosthesis 6 hrs at time daily and 2nd wear 2-4 hrs some days.    Patient is accompained by: Family member   Pertinent History R  TFA 09/12/2016, L TTA 08/12/2016, gout, HTN, DM, Neuropathy, renal failure   Limitations Standing;Walking;House hold activities   Patient Stated Goals Use prostheses to get back into community. Get out of w/c.   Currently in Pain? No/denies  [facility] Adult PT Treatment/Exercise - 02/23/17 0950      Transfers   Transfers Sit to Stand;Stand to Sit;Stand Pivot Transfers   Sit to Stand 4: Min assist;With upper extremity assist;From chair/3-in-1;With armrests  to RW   Sit to Stand Details Verbal cues for precautions/safety;Verbal cues for sequencing;Verbal cues for technique;Tactile cues for weight shifting   Stand to Sit 4: Min guard;With upper extremity assist;To chair/3-in-1;With armrests  from  RW   Stand to Sit Details (indicate cue type and reason) Verbal cues for sequencing;Verbal cues for technique;Verbal cues for precautions/safety   Comments standing with RW and min assist pt was able to reach just at 3 inches forward out of base of support.      Ambulation/Gait   Ambulation/Gait Yes   Ambulation/Gait Assistance 4: Min assist  with second person for safety   Ambulation/Gait Assistance Details verbal & tactile cues on step length, wt shift & RW movement.  PT manually controlling TFA prosthetic knee extension. PT called prosthetist who will increase knee extension assist prior to next PT appt.    Ambulation Distance (Feet) 63 Feet  55' & 10'   Assistive device Rolling walker;Prostheses  TTA & TFA prostheses   Gait Pattern Step-through pattern;Decreased step length - left;Decreased stride length;Decreased stance time - right;Decreased weight shift to right;Right circumduction;Right hip hike;Antalgic;Left flexed knee in stance;Right flexed knee in stance;Wide base of support;Trunk flexed;Poor foot clearance - left   Ambulation Surface Indoor;Level   Curb 3: Mod assist  RW & bil. prostheses on 4" step to simulate curb   Curb Details (indicate cue type and reason) verbal & manual cues on sequence & technique     Prosthetics   Prosthetic Care Comments  Pt able to donne TTA prosthesis & TFA liner with verbal cues only.  MinA to tighten TFA prosthesis in standing when limb seated in socket.    Current prosthetic wear tolerance (days/week)  daily   Current prosthetic wear tolerance (#hours/day)  TFA- wearing liner 4-5 hours 2x day, TTA- wearing liner consistently 4-5 hours 2x day, admits to not wearing prosthesis "much as possible."    Residual limb condition  No open areas. Right TTA bone spur at distal tibia. PT recommended relief in prosthesis to accommodate.    Education Provided Proper wear schedule/adjustment;Proper weight-bearing schedule/adjustment;Residual limb care;Proper  Donning   Person(s) Educated Patient   Education Method Explanation;Verbal cues;Demonstration   Education Method Verbalized understanding;Verbal cues required;Needs further instruction                  PT Short Term Goals - 02/23/17 1242      PT SHORT TERM GOAL #1   Title Patient verbalizes proper cleaning of prostheses.    Time 4   Period Weeks   Status New   Target Date 03/24/17     PT SHORT TERM GOAL #2   Title Patient tolerates wear of TTA prosthesis >8 hrs total /day & TFA prosthesis >2 hrs /day without skin issues.    Time 4   Period Weeks   Status On-going   Target Date 03/24/17     PT SHORT TERM GOAL #3   Title Sit to /from stand w/c to RW with supervision with prostheses.    Time 4   Period Weeks   Status New   Target Date 03/24/17     PT SHORT TERM GOAL #4   Title Patient reaches 5" anteriorly with RW support with prostheses with supervision.    Time 4  Period Weeks   Status On-going   Target Date 03/24/17     PT SHORT TERM GOAL #5   Title Patient ambulates 71' with RW & prostheses with modA of 1 person only.    Time 4   Period Weeks   Status New   Target Date 03/24/17           PT Long Term Goals - 02/23/17 1246      PT LONG TERM GOAL #1   Title Patient verbalizes and demonstrates understanding of prosthetic care to enable safe use of prostheses.  (Target Date: 04/28/2017)   Time 4   Period Months   Status On-going   Target Date 04/28/17     PT LONG TERM GOAL #2   Title Patient tolerates wear of TTA prosthesis >90% and TFA >75% of awake hours to enable function throughout his day. (Target Date: 04/28/2017)   Time 4   Period Months   Status On-going   Target Date 04/28/17     PT LONG TERM GOAL #3   Title Patient performs transfers including sit to/from stand and stand pivot transfers with LRAD & prostheses modified independent.  (Target Date: 04/28/2017)   Time 4   Period Months   Status On-going   Target Date 04/28/17      PT LONG TERM GOAL #4   Title Patient performs standing balance with LRAD & prostheses reaching 10" anteriorly, reaching to floor, looking over shoulders and managing clothes modified independent.  (Target Date: 04/28/2017)   Time 4   Period Months   Status On-going   Target Date 04/28/17     PT LONG TERM GOAL #5   Title Patient ambulates 300' with LRAD & prostheses modified independent to enable community mobility.  (Target Date: 04/28/2017)   Time 4   Period Months   Status On-going   Target Date 04/28/17     PT LONG TERM GOAL #6   Title Patient negotiates ramps, curbs & stairs with LRAD & prostheses modified independent to enable community access.  (Target Date: 04/28/2017)   Time 4   Period Months   Status On-going   Target Date 04/28/17     PT LONG TERM GOAL #7   Title Activities of Balance Confidence score >15%.  (Target Date: 04/28/2017)   Baseline Initial ABC 0.0%   Time 4   Period Months   Status On-going   Target Date 04/28/17               Plan - 02/23/17 1248    Clinical Impression Statement Patient was able to ambulate 77' for first time today but it fatigued him which limited other activities following.    Rehab Potential Good   PT Frequency 2x / week   PT Duration Other (comment)  4 months   PT Treatment/Interventions ADLs/Self Care Home Management;DME Instruction;Gait training;Stair training;Functional mobility training;Therapeutic activities;Therapeutic exercise;Balance training;Neuromuscular re-education;Patient/family education;Prosthetic Training;Manual techniques   PT Next Visit Plan Prosthetic gait with RW work on turning to sit in a chair with armrests, Try curb outdoors or aerobic steps for modified curb again.    Consulted and Agree with Plan of Care Patient      Patient will benefit from skilled therapeutic intervention in order to improve the following deficits and impairments:  Abnormal gait, Decreased activity tolerance, Decreased balance,  Decreased endurance, Decreased knowledge of use of DME, Decreased mobility, Decreased range of motion, Decreased skin integrity, Decreased strength, Postural dysfunction, Prosthetic Dependency, Pain  Visit Diagnosis: Unsteadiness  on feet  Muscle weakness (generalized)  Other abnormalities of gait and mobility  Contracture of muscle, multiple sites     Problem List Patient Active Problem List   Diagnosis Date Noted  . History of right above knee amputation ([place]) 09/21/2016  . Acquired absence of left leg below knee ([place]) 09/21/2016  . Transaminitis   . Diabetic peripheral neuropathy ([place]) 07/21/2016  . Renal failure 07/04/2016  . Hyperuricemia 06/21/2016  . HTN (hypertension) 01/05/2016  . Diabetes type 2, uncontrolled ([place]) 01/05/2016  . Hypothyroidism 05/10/2011    Alexa Golebiewski PT, DPT 02/24/2017, 1:01 PM  [facility] [address] [place], [place], [address] Phone: [phone]   Fax:  [phone]  Name: Todd [name] MRN: [id number] Date of Birth: [birth year]

## 2017-02-27 ENCOUNTER — Ambulatory Visit (INDEPENDENT_AMBULATORY_CARE_PROVIDER_SITE_OTHER): Payer: BLUE CROSS/BLUE SHIELD | Admitting: Orthopedic Surgery

## 2017-02-27 ENCOUNTER — Ambulatory Visit: Payer: BLUE CROSS/BLUE SHIELD | Admitting: Physical Therapy

## 2017-02-28 ENCOUNTER — Encounter: Payer: BLUE CROSS/BLUE SHIELD | Admitting: Physical Therapy

## 2017-03-02 ENCOUNTER — Encounter: Payer: Self-pay | Admitting: Physical Therapy

## 2017-03-02 ENCOUNTER — Ambulatory Visit (INDEPENDENT_AMBULATORY_CARE_PROVIDER_SITE_OTHER): Payer: BLUE CROSS/BLUE SHIELD | Admitting: Orthopedic Surgery

## 2017-03-02 ENCOUNTER — Encounter (INDEPENDENT_AMBULATORY_CARE_PROVIDER_SITE_OTHER): Payer: Self-pay | Admitting: Orthopedic Surgery

## 2017-03-02 ENCOUNTER — Ambulatory Visit: Payer: BLUE CROSS/BLUE SHIELD | Admitting: Physical Therapy

## 2017-03-02 DIAGNOSIS — Z89512 Acquired absence of left leg below knee: Secondary | ICD-10-CM | POA: Diagnosis not present

## 2017-03-02 DIAGNOSIS — R2681 Unsteadiness on feet: Secondary | ICD-10-CM | POA: Diagnosis not present

## 2017-03-02 DIAGNOSIS — Z89611 Acquired absence of right leg above knee: Secondary | ICD-10-CM | POA: Diagnosis not present

## 2017-03-02 DIAGNOSIS — IMO0002 Reserved for concepts with insufficient information to code with codable children: Secondary | ICD-10-CM

## 2017-03-02 DIAGNOSIS — M6281 Muscle weakness (generalized): Secondary | ICD-10-CM

## 2017-03-02 DIAGNOSIS — R2689 Other abnormalities of gait and mobility: Secondary | ICD-10-CM

## 2017-03-02 NOTE — Therapy (Signed)
[facility] [address] [place], [place], [address] Phone: [phone]   Fax:  [phone]  Physical Therapy Treatment  Patient Details  Name: Todd [name] MRN: [id number] Date of Birth: October 06, 1955 Referring Provider: [name], MD  Encounter Date: 03/02/2017      PT End of Session - 03/02/17 1021    Visit Number 17   Number of Visits 35   Date for PT Re-Evaluation 04/28/17   Authorization Type BCBS primary & Medicaid secondary   PT Start Time 1018   PT Stop Time 1100   PT Time Calculation (min) 42 min   Equipment Utilized During Treatment Gait belt   Activity Tolerance Patient limited by fatigue;Patient tolerated treatment well  Patient report pain at "knot"   Behavior During Therapy [facility] for tasks assessed/performed      Past Medical History:  Diagnosis Date  . AKI (acute kidney injury) ([place])   . Constipated   . Diabetes mellitus without complication ([place] [place])   . Diarrhea   . Elevated LFTs   . Goiter   . Gout   . Hyperlipidemia   . Hypertension   . Leukocytosis   . Reactive thrombocytosis   . Right BKA infection ([place]) 08/2016  . Right leg pain   . Sepsis due to undetermined organism ([place])   . Thyroid disease   . Wound infection after surgery 08/2016    Past Surgical History:  Procedure Laterality Date  . ABDOMINAL AORTOGRAM N/A 08/11/2016   Procedure: Abdominal Aortogram;  Surgeon: Waynetta Sandy, MD;  Location: [facility];  Service: Cardiovascular;  Laterality: N/A;  . ABDOMINAL AORTOGRAM W/LOWER EXTREMITY N/A 08/15/2016   Procedure: Abdominal Aortogram w/Lower Extremity;  Surgeon: Elam Dutch, MD;  Location: [facility];  Service: Cardiovascular;  Laterality: N/A;  . AMPUTATION Right 08/17/2016   Procedure: RIGHT BELOW KNEE AMPUTATION;  Surgeon: Elam Dutch, MD;  Location: [facility];  Service: Vascular;  Laterality: Right;  . AMPUTATION Right 09/12/2016   Procedure:  AMPUTATION ABOVE KNEE;  Surgeon: Newt Minion, MD;  Location: [facility];  Service: Orthopedics;  Laterality: Right;  . AMPUTATION Left 08/12/2016   Procedure: LEFT BELOW KNEE AMPUTATION;  Surgeon: Newt Minion, MD;  Location: [facility];  Service: Orthopedics;  Laterality: Left;  . APPLICATION OF WOUND VAC Right 09/12/2016   Procedure: APPLICATION OF WOUND VAC ABOVE KNEE;  Surgeon: Newt Minion, MD;  Location: [facility];  Service: Orthopedics;  Laterality: Right;  . LOWER EXTREMITY ANGIOGRAPHY Bilateral 08/11/2016   Procedure: Lower Extremity Angiography;  Surgeon: Waynetta Sandy, MD;  Location: [facility];  Service: Cardiovascular;  Laterality: Bilateral;  . PERIPHERAL VASCULAR BALLOON ANGIOPLASTY Left 08/11/2016   Procedure: Peripheral Vascular Balloon Angioplasty;  Surgeon: Waynetta Sandy, MD;  Location: [facility];  Service: Cardiovascular;  Laterality: Left;  SFA  . THYROID SURGERY      There were no vitals filed for this visit.      Subjective Assessment - 03/02/17 1020    Subjective No new complaints. No falls to report.    Patient is accompained by: Family member   Pertinent History R  TFA 09/12/2016, L TTA 08/12/2016, gout, HTN, DM, Neuropathy, renal failure   Limitations Standing;Walking;House hold activities   Patient Stated Goals Use prostheses to get back into community. Get out of w/c.   Currently in Pain? No/denies   Pain Score 0-No pain  [facility] Adult PT Treatment/Exercise - 03/02/17 1021      Transfers   Transfers Sit to Stand;Stand to [employer]   Sit to Stand 4: Min assist;With upper extremity assist;From chair/3-in-1;With armrests   Sit to Stand Details Verbal cues for precautions/safety;Verbal cues for sequencing;Verbal cues for technique;Tactile cues for weight shifting   Stand to Sit 4: Min guard;With upper extremity assist;To chair/3-in-1;With armrests;4: Min assist   Stand to Sit Details (indicate cue type and  reason) Verbal cues for sequencing;Verbal cues for technique;Verbal cues for precautions/safety   Stand Pivot Transfers 3: Mod assist   Stand Pivot Transfer Details (indicate cue type and reason) mod assist for 90 degree turn to chair after curb negotaition and after gait with RW/prosthesis. Stand pivot transfer with RW/prosthesis from chair to wheelchair with min assist, max cues on sequencing and 2cd person for safety         Ambulation/Gait   Ambulation/Gait Yes   Ambulation/Gait Assistance 4: Min assist;3: Mod assist  second person for safety   Ambulation/Gait Assistance Details multimodal cues on posture, step length, step placement and base of support with gait. pt fatigued toward end of gait needing mod assist for last 8 feet and 2 person assist for 90 degree turn to sit in chair.    Ambulation Distance (Feet) 35 Feet   Assistive device Rolling walker;Prostheses   Gait Pattern Step-through pattern;Decreased step length - left;Decreased stride length;Decreased stance time - right;Decreased weight shift to right;Right circumduction;Right hip hike;Antalgic;Left flexed knee in stance;Right flexed knee in stance;Wide base of support;Trunk flexed;Poor foot clearance - left   Ambulation Surface Level;Indoor   Curb 3: Mod assist  second person for safety   Curb Details (indicate cue type and reason) with RW/prostheses on 4 inch box, multimodal cues on posture, sequencing and technique     Prosthetics   Current prosthetic wear tolerance (days/week)  daily   Current prosthetic wear tolerance (#hours/day)  TFA- wearing liner 6-7 hours 2x day, TTA- wearing liner consistently 6-7 hours 2x day, not wearing AKA prosthesis at all at home.    Residual limb condition  No open areas. Right TTA bone spur at distal tibia    Education Provided Proper wear schedule/adjustment;Proper weight-bearing schedule/adjustment  reinforced wear at home and sink standing   Person(s) Educated Patient   Education Method  Explanation;Demonstration;Verbal cues   Education Method Verbalized understanding;Verbal cues required;Needs further instruction   Donning Prosthesis Supervision;Minimal assist   Doffing Prosthesis Supervision             PT Short Term Goals - 02/23/17 1242      PT SHORT TERM GOAL #1   Title Patient verbalizes proper cleaning of prostheses.    Time 4   Period Weeks   Status New   Target Date 03/24/17     PT SHORT TERM GOAL #2   Title Patient tolerates wear of TTA prosthesis >8 hrs total /day & TFA prosthesis >2 hrs /day without skin issues.    Time 4   Period Weeks   Status On-going   Target Date 03/24/17     PT SHORT TERM GOAL #3   Title Sit to /from stand w/c to RW with supervision with prostheses.    Time 4   Period Weeks   Status New   Target Date 03/24/17     PT SHORT TERM GOAL #4   Title Patient reaches 5" anteriorly with RW support with prostheses with supervision.    Time 4  Period Weeks   Status On-going   Target Date 03/24/17     PT SHORT TERM GOAL #5   Title Patient ambulates 46' with RW & prostheses with modA of 1 person only.    Time 4   Period Weeks   Status New   Target Date 03/24/17           PT Long Term Goals - 02/23/17 1246      PT LONG TERM GOAL #1   Title Patient verbalizes and demonstrates understanding of prosthetic care to enable safe use of prostheses.  (Target Date: 04/28/2017)   Time 4   Period Months   Status On-going   Target Date 04/28/17     PT LONG TERM GOAL #2   Title Patient tolerates wear of TTA prosthesis >90% and TFA >75% of awake hours to enable function throughout his day. (Target Date: 04/28/2017)   Time 4   Period Months   Status On-going   Target Date 04/28/17     PT LONG TERM GOAL #3   Title Patient performs transfers including sit to/from stand and stand pivot transfers with LRAD & prostheses modified independent.  (Target Date: 04/28/2017)   Time 4   Period Months   Status On-going   Target Date  04/28/17     PT LONG TERM GOAL #4   Title Patient performs standing balance with LRAD & prostheses reaching 10" anteriorly, reaching to floor, looking over shoulders and managing clothes modified independent.  (Target Date: 04/28/2017)   Time 4   Period Months   Status On-going   Target Date 04/28/17     PT LONG TERM GOAL #5   Title Patient ambulates 300' with LRAD & prostheses modified independent to enable community mobility.  (Target Date: 04/28/2017)   Time 4   Period Months   Status On-going   Target Date 04/28/17     PT LONG TERM GOAL #6   Title Patient negotiates ramps, curbs & stairs with LRAD & prostheses modified independent to enable community access.  (Target Date: 04/28/2017)   Time 4   Period Months   Status On-going   Target Date 04/28/17     PT LONG TERM GOAL #7   Title Activities of Balance Confidence score >15%.  (Target Date: 04/28/2017)   Baseline Initial ABC 0.0%   Time 4   Period Months   Status On-going   Target Date 04/28/17            Plan - 03/02/17 1021    Clinical Impression Statement Today's skilled session continued to address mobility with RW/prostheses with emphasis on curb negotiation and gait. Pt continues to fatigue quickly needing increased assistance for balance as this occurs. Pt is progressing toward goals and should benefit from continued PT to progress toward unmet goals   Rehab Potential Good   PT Frequency 2x / week   PT Duration Other (comment)  4 months   PT Treatment/Interventions ADLs/Self Care Home Management;DME Instruction;Gait training;Stair training;Functional mobility training;Therapeutic activities;Therapeutic exercise;Balance training;Neuromuscular re-education;Patient/family education;Prosthetic Training;Manual techniques   PT Next Visit Plan Prosthetic gait with RW work on turning to sit in a chair with armrests, Try curb outdoors or aerobic steps for modified curb again.    Consulted and Agree with Plan of Care  Patient      Patient will benefit from skilled therapeutic intervention in order to improve the following deficits and impairments:  Abnormal gait, Decreased activity tolerance, Decreased balance, Decreased endurance, Decreased knowledge of use  of DME, Decreased mobility, Decreased range of motion, Decreased skin integrity, Decreased strength, Postural dysfunction, Prosthetic Dependency, Pain  Visit Diagnosis: Unsteadiness on feet  Muscle weakness (generalized)  Other abnormalities of gait and mobility     Problem List Patient Active Problem List   Diagnosis Date Noted  . History of right above knee amputation ([facility]) 09/21/2016  . Acquired absence of left leg below knee ([facility] [facility]) 09/21/2016  . Transaminitis   . Diabetic peripheral neuropathy ([place]) 07/21/2016  . Renal failure 07/04/2016  . Hyperuricemia 06/21/2016  . HTN (hypertension) 01/05/2016  . Diabetes type 2, uncontrolled ([facility] [place]) 01/05/2016  . Hypothyroidism 05/10/2011    Willow Ora, PTA, [facility] [address], [place], [place] [address] [phone] 03/02/17, 4:03 PM  Name: Todd [name] MRN: [id number] Date of Birth: 08/23/1955

## 2017-03-02 NOTE — Progress Notes (Signed)
Office Visit Note   Patient: Todd [name]           Date of Birth: 12/03/1955           MRN: [id number] Visit Date: 03/02/2017              Requested by: [name], MD [place], [place] [address] PCP: [name], MD  Chief Complaint  Patient presents with  . Right Leg - Follow-up    R AKA   . Left Leg - Follow-up    L BKA      HPI: Patient is a 61 year old gentleman presents in follow-up status post right above-the-knee amputation approximately 5 months ago with a left below-the-knee amputation. Patient is working with Shirlean Mylar for gait training he states he took 45 steps today. He states that he uses his stump shrinker is about 6-7 hours a day.  Assessment & Plan: Visit Diagnoses:  1. Above knee amputation status, right ([facility])   2. Acquired absence of left leg below knee ([facility])     Plan: Continue with his therapy for strengthening of his lower extremities. Patient has a high energy and that demand for using these prosthesis. Follow-up in 3 months.  Follow-Up Instructions: Return in about 3 months (around 06/01/2017).   Ortho Exam  Patient is alert, oriented, no adenopathy, well-dressed, normal affect, normal respiratory effort. Examination patient relates in a wheelchair. The right above-the-knee amputation is consolidated nicely there is no redness no synovitis no drainage. Examination he has full extension with the left knee with good consolidation of the left transtibial amputation with no ulcers no calluses.  Imaging: No results found. No images are attached to the encounter.  Labs: Lab Results  Component Value Date   HGBA1C 6.8 (H) 08/08/2016   HGBA1C 14.3 (H) 12/21/2015   ESRSEDRATE 138 (H) 09/08/2016   ESRSEDRATE 117 (H) 08/08/2016   CRP 7.1 (H) 09/08/2016   CRP 3.2 (H) 08/08/2016   LABURIC 9.5 (H) 11/11/2016   LABURIC 9.2 (H) 07/11/2016   LABURIC 11.4 (H) 07/04/2016   REPTSTATUS 09/13/2016 FINAL 09/08/2016   REPTSTATUS 09/13/2016  FINAL 09/08/2016   CULT NO GROWTH 5 DAYS 09/08/2016   CULT NO GROWTH 5 DAYS 09/08/2016    Orders:  No orders of the defined types were placed in this encounter.  No orders of the defined types were placed in this encounter.    Procedures: No procedures performed  Clinical Data: No additional findings.  ROS:  All other systems negative, except as noted in the HPI. Review of Systems  Objective: Vital Signs: There were no vitals taken for this visit.  Specialty Comments:  No specialty comments available.  PMFS History: Patient Active Problem List   Diagnosis Date Noted  . History of right above knee amputation ([facility]) 09/21/2016  . Acquired absence of left leg below knee ([facility]) 09/21/2016  . Transaminitis   . Diabetic peripheral neuropathy ([facility]) 07/21/2016  . Renal failure 07/04/2016  . Hyperuricemia 06/21/2016  . HTN (hypertension) 01/05/2016  . Diabetes type 2, uncontrolled ([facility]) 01/05/2016  . Hypothyroidism 05/10/2011   Past Medical History:  Diagnosis Date  . AKI (acute kidney injury) ([facility])   . Constipated   . Diabetes mellitus without complication ([facility])   . Diarrhea   . Elevated LFTs   . Goiter   . Gout   . Hyperlipidemia   . Hypertension   . Leukocytosis   . Reactive thrombocytosis   . Right BKA infection ([place]) 08/2016  .  Right leg pain   . Sepsis due to undetermined organism ([facility])   . Thyroid disease   . Wound infection after surgery 08/2016    Family History  Problem Relation Age of Onset  . Heart disease Mother   . Pneumonia Father   . Diabetes Maternal Aunt   . Diabetes Maternal Uncle     Past Surgical History:  Procedure Laterality Date  . ABDOMINAL AORTOGRAM N/A 08/11/2016   Procedure: Abdominal Aortogram;  Surgeon: Waynetta Sandy, MD;  Location: [facility];  Service: Cardiovascular;  Laterality: N/A;  . ABDOMINAL AORTOGRAM W/LOWER EXTREMITY N/A 08/15/2016   Procedure: Abdominal Aortogram w/Lower Extremity;  Surgeon: Elam Dutch, MD;  Location: [facility];  Service: Cardiovascular;  Laterality: N/A;  . AMPUTATION Right 08/17/2016   Procedure: RIGHT BELOW KNEE AMPUTATION;  Surgeon: Elam Dutch, MD;  Location: [facility];  Service: Vascular;  Laterality: Right;  . AMPUTATION Right 09/12/2016   Procedure: AMPUTATION ABOVE KNEE;  Surgeon: Newt Minion, MD;  Location: [facility];  Service: Orthopedics;  Laterality: Right;  . AMPUTATION Left 08/12/2016   Procedure: LEFT BELOW KNEE AMPUTATION;  Surgeon: Newt Minion, MD;  Location: [facility];  Service: Orthopedics;  Laterality: Left;  . APPLICATION OF WOUND VAC Right 09/12/2016   Procedure: APPLICATION OF WOUND VAC ABOVE KNEE;  Surgeon: Newt Minion, MD;  Location: [facility];  Service: Orthopedics;  Laterality: Right;  . LOWER EXTREMITY ANGIOGRAPHY Bilateral 08/11/2016   Procedure: Lower Extremity Angiography;  Surgeon: Waynetta Sandy, MD;  Location: [facility];  Service: Cardiovascular;  Laterality: Bilateral;  . PERIPHERAL VASCULAR BALLOON ANGIOPLASTY Left 08/11/2016   Procedure: Peripheral Vascular Balloon Angioplasty;  Surgeon: Waynetta Sandy, MD;  Location: [facility];  Service: Cardiovascular;  Laterality: Left;  SFA  . THYROID SURGERY     Social History   Occupational History  . drives a forklift    Social History Main Topics  . Smoking status: Current Every Day Smoker    Packs/day: 0.75    Years: 45.00    Types: Cigarettes    Last attempt to quit: 11/12/2014  . Smokeless tobacco: Never Used  . Alcohol use No     Comment: h/o heavy use; stopped drinking in 7/16  . Drug use: No  . Sexual activity: Not Currently

## 2017-03-07 ENCOUNTER — Encounter: Payer: Self-pay | Admitting: Physical Therapy

## 2017-03-07 ENCOUNTER — Ambulatory Visit: Payer: BLUE CROSS/BLUE SHIELD | Admitting: Physical Therapy

## 2017-03-07 DIAGNOSIS — R2689 Other abnormalities of gait and mobility: Secondary | ICD-10-CM

## 2017-03-07 DIAGNOSIS — R2681 Unsteadiness on feet: Secondary | ICD-10-CM | POA: Diagnosis not present

## 2017-03-07 DIAGNOSIS — M6249 Contracture of muscle, multiple sites: Secondary | ICD-10-CM

## 2017-03-07 DIAGNOSIS — M6281 Muscle weakness (generalized): Secondary | ICD-10-CM

## 2017-03-07 NOTE — Therapy (Signed)
[facility] [address] [place], [place], [address] Phone: [phone]   Fax:  [phone]  Physical Therapy Treatment  Patient Details  Name: Todd [name] MRN: [id number] Date of Birth: [birth year] Referring Provider: [name], MD  Encounter Date: 03/07/2017      PT End of Session - 03/07/17 1203    Visit Number 18   Number of Visits 35   Date for PT Re-Evaluation 04/28/17   Authorization Type BCBS primary & Medicaid secondary   PT Start Time 1015   PT Stop Time 1100   PT Time Calculation (min) 45 min   Equipment Utilized During Treatment Gait belt   Activity Tolerance Patient limited by fatigue;Patient tolerated treatment well  Patient report pain at "knot"   Behavior During Therapy [facility] for tasks assessed/performed      Past Medical History:  Diagnosis Date  . AKI (acute kidney injury) ([place])   . Constipated   . Diabetes mellitus without complication ([place])   . Diarrhea   . Elevated LFTs   . Goiter   . Gout   . Hyperlipidemia   . Hypertension   . Leukocytosis   . Reactive thrombocytosis   . Right BKA infection ([place]) 08/2016  . Right leg pain   . Sepsis due to undetermined organism ([place])   . Thyroid disease   . Wound infection after surgery 08/2016    Past Surgical History:  Procedure Laterality Date  . ABDOMINAL AORTOGRAM N/A 08/11/2016   Procedure: Abdominal Aortogram;  Surgeon: Waynetta Sandy, MD;  Location: [facility];  Service: Cardiovascular;  Laterality: N/A;  . ABDOMINAL AORTOGRAM W/LOWER EXTREMITY N/A 08/15/2016   Procedure: Abdominal Aortogram w/Lower Extremity;  Surgeon: Elam Dutch, MD;  Location: [facility];  Service: Cardiovascular;  Laterality: N/A;  . AMPUTATION Right 08/17/2016   Procedure: RIGHT BELOW KNEE AMPUTATION;  Surgeon: Elam Dutch, MD;  Location: [facility];  Service: Vascular;  Laterality: Right;  . AMPUTATION Right 09/12/2016   Procedure:  AMPUTATION ABOVE KNEE;  Surgeon: Newt Minion, MD;  Location: [facility];  Service: Orthopedics;  Laterality: Right;  . AMPUTATION Left 08/12/2016   Procedure: LEFT BELOW KNEE AMPUTATION;  Surgeon: Newt Minion, MD;  Location: [facility];  Service: Orthopedics;  Laterality: Left;  . APPLICATION OF WOUND VAC Right 09/12/2016   Procedure: APPLICATION OF WOUND VAC ABOVE KNEE;  Surgeon: Newt Minion, MD;  Location: [facility];  Service: Orthopedics;  Laterality: Right;  . LOWER EXTREMITY ANGIOGRAPHY Bilateral 08/11/2016   Procedure: Lower Extremity Angiography;  Surgeon: Waynetta Sandy, MD;  Location: [facility];  Service: Cardiovascular;  Laterality: Bilateral;  . PERIPHERAL VASCULAR BALLOON ANGIOPLASTY Left 08/11/2016   Procedure: Peripheral Vascular Balloon Angioplasty;  Surgeon: Waynetta Sandy, MD;  Location: [facility];  Service: Cardiovascular;  Laterality: Left;  SFA  . THYROID SURGERY      There were no vitals filed for this visit.      Subjective Assessment - 03/07/17 1015    Subjective Wearing TTA prosthesis & TFA liner 4-5 hrs 2x/day. Pt arrives with shrinker on TTA limb & liner on TFA limb.    Patient is accompained by: Family member   Pertinent History R  TFA 09/12/2016, L TTA 08/12/2016, gout, HTN, DM, Neuropathy, renal failure   Limitations Standing;Walking;House hold activities   Patient Stated Goals Use prostheses to get back into community. Get out of w/c.   Currently in Pain?  No/denies      Prosthetic Training with Left Transtibial Amputation prosthesis & Right Transfemoral Amputation prosthesis. Upon discussion pt is only wearing liners because he can not get TTA pin to lock into socket.  PT instructed & pt attempted to engage pin: 1st with prosthetic heel contact on floor & UEs pressing on knee; 2nd prosthesis against cabinet and leg press with UE addition; 3rd partial stand at sink; 4th prosthetic heel on 4" block and pressing with UEs. Patient was unable to  generate enough force with any of 4 techniques to get pin to engage. PT was able to apply additional pressure with all 4 techniques to engage pin which indicates pin was aligned properly.  Squat-pivot transfers 4 reps over armrests between 2 chairs positioned 90* using TTA prosthesis & BUEs to right & left with supervision & verbal cues. Pt verbalized how TTA prosthesis helps with transfers.  Sit to stand w/c to RW with minA & verbal cues. Stand to sit RW to w/c with supervision & verbal cues Pt ambulated 41' with RW & prostheses with modA of 1 person. PT manual cues to extend TFA knee at initial contact & loading response. Verbal cues on posture, sequence and wt shift.                         PT Education - 03/07/17 1030    Education provided Yes   Education Details using TTA prosthesis for transfers including [facility] & car.    Person(s) Educated Patient   Methods Explanation;Verbal cues   Comprehension Verbalized understanding;Returned demonstration;Need further instruction          PT Short Term Goals - 02/23/17 1242      PT SHORT TERM GOAL #1   Title Patient verbalizes proper cleaning of prostheses.    Time 4   Period Weeks   Status New   Target Date 03/24/17     PT SHORT TERM GOAL #2   Title Patient tolerates wear of TTA prosthesis >8 hrs total /day & TFA prosthesis >2 hrs /day without skin issues.    Time 4   Period Weeks   Status On-going   Target Date 03/24/17     PT SHORT TERM GOAL #3   Title Sit to /from stand w/c to RW with supervision with prostheses.    Time 4   Period Weeks   Status New   Target Date 03/24/17     PT SHORT TERM GOAL #4   Title Patient reaches 5" anteriorly with RW support with prostheses with supervision.    Time 4   Period Weeks   Status On-going   Target Date 03/24/17     PT SHORT TERM GOAL #5   Title Patient ambulates 37' with RW & prostheses with modA of 1 person only.    Time 4   Period Weeks   Status New   Target  Date 03/24/17           PT Long Term Goals - 02/23/17 1246      PT LONG TERM GOAL #1   Title Patient verbalizes and demonstrates understanding of prosthetic care to enable safe use of prostheses.  (Target Date: 04/28/2017)   Time 4   Period Months   Status On-going   Target Date 04/28/17     PT LONG TERM GOAL #2   Title Patient tolerates wear of TTA prosthesis >90% and TFA >75% of awake hours to enable function throughout his  day. (Target Date: 04/28/2017)   Time 4   Period Months   Status On-going   Target Date 04/28/17     PT LONG TERM GOAL #3   Title Patient performs transfers including sit to/from stand and stand pivot transfers with LRAD & prostheses modified independent.  (Target Date: 04/28/2017)   Time 4   Period Months   Status On-going   Target Date 04/28/17     PT LONG TERM GOAL #4   Title Patient performs standing balance with LRAD & prostheses reaching 10" anteriorly, reaching to floor, looking over shoulders and managing clothes modified independent.  (Target Date: 04/28/2017)   Time 4   Period Months   Status On-going   Target Date 04/28/17     PT LONG TERM GOAL #5   Title Patient ambulates 300' with LRAD & prostheses modified independent to enable community mobility.  (Target Date: 04/28/2017)   Time 4   Period Months   Status On-going   Target Date 04/28/17     PT LONG TERM GOAL #6   Title Patient negotiates ramps, curbs & stairs with LRAD & prostheses modified independent to enable community access.  (Target Date: 04/28/2017)   Time 4   Period Months   Status On-going   Target Date 04/28/17     PT LONG TERM GOAL #7   Title Activities of Balance Confidence score >15%.  (Target Date: 04/28/2017)   Baseline Initial ABC 0.0%   Time 4   Period Months   Status On-going   Target Date 04/28/17               Plan - 03/07/17 1204    Clinical Impression Statement Patient was unable to engage shuttle pin lock into socket even with alternative  techniques attempted. His pin is 2.5" and he may benefit from 3" pin if manufacturer makes one for his lock system. Patient was able to ambulate without 2nd person today.    Rehab Potential Good   PT Frequency 2x / week   PT Duration Other (comment)  4 months   PT Treatment/Interventions ADLs/Self Care Home Management;DME Instruction;Gait training;Stair training;Functional mobility training;Therapeutic activities;Therapeutic exercise;Balance training;Neuromuscular re-education;Patient/family education;Prosthetic Training;Manual techniques   PT Next Visit Plan Prosthetic gait with RW work on turning to sit in a chair with armrests, Try curb outdoors or aerobic steps for modified curb again.    Consulted and Agree with Plan of Care Patient      Patient will benefit from skilled therapeutic intervention in order to improve the following deficits and impairments:  Abnormal gait, Decreased activity tolerance, Decreased balance, Decreased endurance, Decreased knowledge of use of DME, Decreased mobility, Decreased range of motion, Decreased skin integrity, Decreased strength, Postural dysfunction, Prosthetic Dependency, Pain  Visit Diagnosis: Unsteadiness on feet  Muscle weakness (generalized)  Other abnormalities of gait and mobility  Contracture of muscle, multiple sites     Problem List Patient Active Problem List   Diagnosis Date Noted  . History of right above knee amputation ([place]) 09/21/2016  . Acquired absence of left leg below knee ([place]) 09/21/2016  . Transaminitis   . Diabetic peripheral neuropathy ([place]) 07/21/2016  . Renal failure 07/04/2016  . Hyperuricemia 06/21/2016  . HTN (hypertension) 01/05/2016  . Diabetes type 2, uncontrolled ([place]) 01/05/2016  . Hypothyroidism 05/10/2011    Winona Sison PT, DPT 03/07/2017, 12:08 PM  [facility] [address] [place], [place], [address] Phone: [phone]   Fax:   [phone]  Name:  Todd [name] MRN: [id number] Date of Birth: 11-13-1955

## 2017-03-09 ENCOUNTER — Ambulatory Visit: Payer: BLUE CROSS/BLUE SHIELD | Admitting: Physical Therapy

## 2017-03-14 ENCOUNTER — Ambulatory Visit: Payer: BLUE CROSS/BLUE SHIELD | Attending: Family Medicine | Admitting: Physical Therapy

## 2017-03-14 ENCOUNTER — Encounter: Payer: Self-pay | Admitting: Physical Therapy

## 2017-03-14 DIAGNOSIS — R2689 Other abnormalities of gait and mobility: Secondary | ICD-10-CM | POA: Diagnosis present

## 2017-03-14 DIAGNOSIS — M6281 Muscle weakness (generalized): Secondary | ICD-10-CM | POA: Insufficient documentation

## 2017-03-14 DIAGNOSIS — R2681 Unsteadiness on feet: Secondary | ICD-10-CM | POA: Diagnosis present

## 2017-03-14 DIAGNOSIS — M6249 Contracture of muscle, multiple sites: Secondary | ICD-10-CM | POA: Insufficient documentation

## 2017-03-14 NOTE — Therapy (Signed)
[facility] [address] [place], [place], [address] Phone: [phone]   Fax:  [phone]  Physical Therapy Treatment  Patient Details  Name: Todd [name] MRN: [id number] Date of Birth: 11-05-1955 Referring Provider: [name], MD  Encounter Date: 03/14/2017      PT End of Session - 03/14/17 1024    Visit Number 19   Number of Visits 35   Date for PT Re-Evaluation 04/28/17   Authorization Type BCBS primary & Medicaid secondary   PT Start Time 1019   PT Stop Time 1059   PT Time Calculation (min) 40 min   Equipment Utilized During Treatment Gait belt   Activity Tolerance Patient limited by fatigue;Patient tolerated treatment well  Patient report pain at "knot"   Behavior During Therapy [facility] for tasks assessed/performed      Past Medical History:  Diagnosis Date  . AKI (acute kidney injury) ([place])   . Constipated   . Diabetes mellitus without complication ([place])   . Diarrhea   . Elevated LFTs   . Goiter   . Gout   . Hyperlipidemia   . Hypertension   . Leukocytosis   . Reactive thrombocytosis   . Right BKA infection ([place]) 08/2016  . Right leg pain   . Sepsis due to undetermined organism ([place])   . Thyroid disease   . Wound infection after surgery 08/2016    Past Surgical History:  Procedure Laterality Date  . ABDOMINAL AORTOGRAM N/A 08/11/2016   Procedure: Abdominal Aortogram;  Surgeon: Waynetta Sandy, MD;  Location: [facility];  Service: Cardiovascular;  Laterality: N/A;  . ABDOMINAL AORTOGRAM W/LOWER EXTREMITY N/A 08/15/2016   Procedure: Abdominal Aortogram w/Lower Extremity;  Surgeon: Elam Dutch, MD;  Location: [facility];  Service: Cardiovascular;  Laterality: N/A;  . AMPUTATION Right 08/17/2016   Procedure: RIGHT BELOW KNEE AMPUTATION;  Surgeon: Elam Dutch, MD;  Location: [facility];  Service: Vascular;  Laterality: Right;  . AMPUTATION Right 09/12/2016   Procedure:  AMPUTATION ABOVE KNEE;  Surgeon: Newt Minion, MD;  Location: [facility];  Service: Orthopedics;  Laterality: Right;  . AMPUTATION Left 08/12/2016   Procedure: LEFT BELOW KNEE AMPUTATION;  Surgeon: Newt Minion, MD;  Location: [facility];  Service: Orthopedics;  Laterality: Left;  . APPLICATION OF WOUND VAC Right 09/12/2016   Procedure: APPLICATION OF WOUND VAC ABOVE KNEE;  Surgeon: Newt Minion, MD;  Location: [facility];  Service: Orthopedics;  Laterality: Right;  . LOWER EXTREMITY ANGIOGRAPHY Bilateral 08/11/2016   Procedure: Lower Extremity Angiography;  Surgeon: Waynetta Sandy, MD;  Location: [facility];  Service: Cardiovascular;  Laterality: Bilateral;  . PERIPHERAL VASCULAR BALLOON ANGIOPLASTY Left 08/11/2016   Procedure: Peripheral Vascular Balloon Angioplasty;  Surgeon: Waynetta Sandy, MD;  Location: [facility];  Service: Cardiovascular;  Laterality: Left;  SFA  . THYROID SURGERY      There were no vitals filed for this visit.      Subjective Assessment - 03/14/17 1023    Subjective no falls to report. does report a new "knot" on lateral edge of left limb causing pain. Decreased wear due to this.    Patient is accompained by: Family member   Pertinent History R  TFA 09/12/2016, L TTA 08/12/2016, gout, HTN, DM, Neuropathy, renal failure   Limitations Standing;Walking;House hold activities   Patient Stated Goals Use prostheses to get back into community. Get out of w/c.  [facility] Adult PT Treatment/Exercise - 03/14/17 1025      Transfers   Transfers Sit to Stand;Stand to Sit   Sit to Stand 4: Min assist;3: Mod assist;With upper extremity assist;From chair/3-in-1;With armrests   Sit to Stand Details Verbal cues for precautions/safety;Verbal cues for sequencing;Verbal cues for technique;Tactile cues for weight shifting   Sit to Stand Details (indicate cue type and reason) reminder cues for sequencing   Stand to Sit 4: Min guard;With upper extremity  assist;To chair/3-in-1;With armrests;4: Min assist   Stand to Sit Details (indicate cue type and reason) Verbal cues for sequencing;Verbal cues for technique;Verbal cues for precautions/safety   Stand to Sit Details increaesed time needed, howver no physical assistance was needed.    Stand Pivot Transfers 3: Mod assist   Stand Pivot Transfer Details (indicate cue type and reason) mod assist with 1st one after curb with cues on sequencing; mod assist of 2 with second one after gait.                   Ambulation/Gait   Ambulation/Gait Yes   Ambulation/Gait Assistance 4: Min assist  second person near by for safety   Ambulation/Gait Assistance Details mod cues on sequencing intitally, downgraded to min cues, assist/cues for posture, walker position and step placement needed as well.    Ambulation Distance (Feet) 33 Feet   Assistive device Rolling walker;Prostheses   Gait Pattern Step-through pattern;Decreased stride length;Decreased trunk rotation;Trunk flexed;Wide base of support;Right flexed knee in stance   Ambulation Surface Level;Indoor   Curb 3: Mod assist  second person for safety   Curb Details (indicate cue type and reason) using 4 inch box, cues on posture, sequencing, and weight shifitng.      Prosthetics   Prosthetic Care Comments  encouraged pt to wear AKA prosthesis SEATED ONLY as far as he can on his limb for 2 hours a day to begin to get limb used to pressure/heat of socket.    Current prosthetic wear tolerance (days/week)  daily   Current prosthetic wear tolerance (#hours/day)  TFA- wearing liner 6-7 hours 2x day, TTA- wearing liner consistently 6-7 hours 2x day, not wearing AKA prosthesis at all at home.    Residual limb condition  No open areas. Right TTA bone spur at distal tibia  now with reported "knot" at lateral end of right limb at end of incision line, mildly palpable with moderat pressure, will monitor.    Education Provided Proper wear schedule/adjustment;Proper  weight-bearing schedule/adjustment   Person(s) Educated Patient;Caregiver(s)   Education Method Explanation;Demonstration;Verbal cues   Education Method Verbalized understanding;Verbal cues required;Needs further instruction   Donning Prosthesis Supervision;Minimal assist   Doffing Prosthesis Supervision            PT Short Term Goals - 02/23/17 1242      PT SHORT TERM GOAL #1   Title Patient verbalizes proper cleaning of prostheses.    Time 4   Period Weeks   Status New   Target Date 03/24/17     PT SHORT TERM GOAL #2   Title Patient tolerates wear of TTA prosthesis >8 hrs total /day & TFA prosthesis >2 hrs /day without skin issues.    Time 4   Period Weeks   Status On-going   Target Date 03/24/17     PT SHORT TERM GOAL #3   Title Sit to /from stand w/c to RW with supervision with prostheses.    Time 4   Period Weeks   Status New  Target Date 03/24/17     PT SHORT TERM GOAL #4   Title Patient reaches 5" anteriorly with RW support with prostheses with supervision.    Time 4   Period Weeks   Status On-going   Target Date 03/24/17     PT SHORT TERM GOAL #5   Title Patient ambulates 76' with RW & prostheses with modA of 1 person only.    Time 4   Period Weeks   Status New   Target Date 03/24/17           PT Long Term Goals - 02/23/17 1246      PT LONG TERM GOAL #1   Title Patient verbalizes and demonstrates understanding of prosthetic care to enable safe use of prostheses.  (Target Date: 04/28/2017)   Time 4   Period Months   Status On-going   Target Date 04/28/17     PT LONG TERM GOAL #2   Title Patient tolerates wear of TTA prosthesis >90% and TFA >75% of awake hours to enable function throughout his day. (Target Date: 04/28/2017)   Time 4   Period Months   Status On-going   Target Date 04/28/17     PT LONG TERM GOAL #3   Title Patient performs transfers including sit to/from stand and stand pivot transfers with LRAD & prostheses modified  independent.  (Target Date: 04/28/2017)   Time 4   Period Months   Status On-going   Target Date 04/28/17     PT LONG TERM GOAL #4   Title Patient performs standing balance with LRAD & prostheses reaching 10" anteriorly, reaching to floor, looking over shoulders and managing clothes modified independent.  (Target Date: 04/28/2017)   Time 4   Period Months   Status On-going   Target Date 04/28/17     PT LONG TERM GOAL #5   Title Patient ambulates 300' with LRAD & prostheses modified independent to enable community mobility.  (Target Date: 04/28/2017)   Time 4   Period Months   Status On-going   Target Date 04/28/17     PT LONG TERM GOAL #6   Title Patient negotiates ramps, curbs & stairs with LRAD & prostheses modified independent to enable community access.  (Target Date: 04/28/2017)   Time 4   Period Months   Status On-going   Target Date 04/28/17     PT LONG TERM GOAL #7   Title Activities of Balance Confidence score >15%.  (Target Date: 04/28/2017)   Baseline Initial ABC 0.0%   Time 4   Period Months   Status On-going   Target Date 04/28/17             Plan - 03/14/17 1024    Clinical Impression Statement Today's skilled session continued to address gait and curb management with RW/bil prostheses. Pt once again was able to ambulate a short distance with 1 person assist, distant supervision from second person in case assist was needed. 2 person assist was needed for turn pivot to chair after gait, min assist of both parties. Pt is progressing toward goals and should benefit from continued PT to progress toward unmet goals.    Rehab Potential Good   PT Frequency 2x / week   PT Duration Other (comment)  4 months   PT Treatment/Interventions ADLs/Self Care Home Management;DME Instruction;Gait training;Stair training;Functional mobility training;Therapeutic activities;Therapeutic exercise;Balance training;Neuromuscular re-education;Patient/family education;Prosthetic  Training;Manual techniques   PT Next Visit Plan G-code next visit; Prosthetic gait with RW work on turning  to sit in a chair with armrests, Try curb outdoors or aerobic steps for modified curb again.    Consulted and Agree with Plan of Care Patient      Patient will benefit from skilled therapeutic intervention in order to improve the following deficits and impairments:  Abnormal gait, Decreased activity tolerance, Decreased balance, Decreased endurance, Decreased knowledge of use of DME, Decreased mobility, Decreased range of motion, Decreased skin integrity, Decreased strength, Postural dysfunction, Prosthetic Dependency, Pain  Visit Diagnosis: Unsteadiness on feet  Muscle weakness (generalized)  Other abnormalities of gait and mobility     Problem List Patient Active Problem List   Diagnosis Date Noted  . History of right above knee amputation ([place]) 09/21/2016  . Acquired absence of left leg below knee ([place]) 09/21/2016  . Transaminitis   . Diabetic peripheral neuropathy ([place]) 07/21/2016  . Renal failure 07/04/2016  . Hyperuricemia 06/21/2016  . HTN (hypertension) 01/05/2016  . Diabetes type 2, uncontrolled ([place]) 01/05/2016  . Hypothyroidism 05/10/2011    Willow Ora, PTA, [facility] [address] [place], [place] [address] [phone] 03/14/17, 2:34 PM   Name: Todd [name] MRN: [id number] Date of Birth: March 04, 1956

## 2017-03-16 ENCOUNTER — Encounter: Payer: Self-pay | Admitting: Physical Therapy

## 2017-03-16 ENCOUNTER — Ambulatory Visit: Payer: BLUE CROSS/BLUE SHIELD | Admitting: Physical Therapy

## 2017-03-16 DIAGNOSIS — M6249 Contracture of muscle, multiple sites: Secondary | ICD-10-CM

## 2017-03-16 DIAGNOSIS — R2681 Unsteadiness on feet: Secondary | ICD-10-CM

## 2017-03-16 DIAGNOSIS — R2689 Other abnormalities of gait and mobility: Secondary | ICD-10-CM

## 2017-03-16 DIAGNOSIS — M6281 Muscle weakness (generalized): Secondary | ICD-10-CM

## 2017-03-17 NOTE — Therapy (Signed)
[facility] [address] [place], [place], [address] Phone: [phone]   Fax:  [phone]  Physical Therapy Treatment  Patient Details  Name: Todd [name] MRN: [id number] Date of Birth: 07-10-1955 Referring Provider: [name], MD  Encounter Date: 03/16/2017    Past Medical History:  Diagnosis Date  . AKI (acute kidney injury) ([place])   . Constipated   . Diabetes mellitus without complication ([place])   . Diarrhea   . Elevated LFTs   . Goiter   . Gout   . Hyperlipidemia   . Hypertension   . Leukocytosis   . Reactive thrombocytosis   . Right BKA infection ([place]) 08/2016  . Right leg pain   . Sepsis due to undetermined organism ([place])   . Thyroid disease   . Wound infection after surgery 08/2016    Past Surgical History:  Procedure Laterality Date  . ABDOMINAL AORTOGRAM N/A 08/11/2016   Procedure: Abdominal Aortogram;  Surgeon: Waynetta Sandy, MD;  Location: [facility];  Service: Cardiovascular;  Laterality: N/A;  . ABDOMINAL AORTOGRAM W/LOWER EXTREMITY N/A 08/15/2016   Procedure: Abdominal Aortogram w/Lower Extremity;  Surgeon: Elam Dutch, MD;  Location: [facility];  Service: Cardiovascular;  Laterality: N/A;  . AMPUTATION Right 08/17/2016   Procedure: RIGHT BELOW KNEE AMPUTATION;  Surgeon: Elam Dutch, MD;  Location: [facility];  Service: Vascular;  Laterality: Right;  . AMPUTATION Right 09/12/2016   Procedure: AMPUTATION ABOVE KNEE;  Surgeon: Newt Minion, MD;  Location: [facility];  Service: Orthopedics;  Laterality: Right;  . AMPUTATION Left 08/12/2016   Procedure: LEFT BELOW KNEE AMPUTATION;  Surgeon: Newt Minion, MD;  Location: [facility];  Service: Orthopedics;  Laterality: Left;  . APPLICATION OF WOUND VAC Right 09/12/2016   Procedure: APPLICATION OF WOUND VAC ABOVE KNEE;  Surgeon: Newt Minion, MD;  Location: [facility];  Service: Orthopedics;  Laterality: Right;  . LOWER EXTREMITY  ANGIOGRAPHY Bilateral 08/11/2016   Procedure: Lower Extremity Angiography;  Surgeon: Waynetta Sandy, MD;  Location: [facility];  Service: Cardiovascular;  Laterality: Bilateral;  . PERIPHERAL VASCULAR BALLOON ANGIOPLASTY Left 08/11/2016   Procedure: Peripheral Vascular Balloon Angioplasty;  Surgeon: Waynetta Sandy, MD;  Location: [facility];  Service: Cardiovascular;  Laterality: Left;  SFA  . THYROID SURGERY      There were no vitals filed for this visit.      Subjective Assessment - 03/16/17 0926    Subjective The TTA limb is still sore on outside bone.   Patient is accompained by: Family member   Pertinent History R  TFA 09/12/2016, L TTA 08/12/2016, gout, HTN, DM, Neuropathy, renal failure   Limitations Standing;Walking;House hold activities   Patient Stated Goals Use prostheses to get back into community. Get out of w/c.   Currently in Pain? No/denies                         [facility] Adult PT Treatment/Exercise - 03/16/17 0930      Transfers   Transfers Sit to Stand;Stand to Sit   Sit to Stand 4: Min assist;3: Mod assist;With upper extremity assist;From chair/3-in-1;With armrests  to RW,    Sit to Stand Details Verbal cues for technique;Tactile cues for weight shifting;Verbal cues for precautions/safety;Manual facilitation for weight shifting   Stand to Sit 4: Min guard;4: Min assist;5: Supervision;With upper extremity assist;With armrests;To chair/3-in-1  from RW, SBA w/c, Min guard  chairs w/armrest, MinA mat table   Stand to Sit Details (indicate cue type and reason) Verbal cues for sequencing;Verbal cues for technique;Verbal cues for precautions/safety   Stand Pivot Transfers 3: Mod assist  with RW turning 90* between chairs w/ armrests     Ambulation/Gait   Ambulation/Gait Yes   Ambulation/Gait Assistance 4: Min assist  second person near by for safety   Ambulation/Gait Assistance Details Verbal & tactile cues on posture, step  length and wt shift. Manaual cues to lock TFA knee with loading response.    Ambulation Distance (Feet) 60 Feet   Assistive device Rolling walker;Prostheses   Gait Pattern Step-through pattern;Decreased stride length;Decreased trunk rotation;Trunk flexed;Wide base of support;Right flexed knee in stance   Ambulation Surface Indoor;Level   Curb 3: Mod assist  second person for safety   Curb Details (indicate cue type and reason) 7" curb with verbal & manual cues on sequence, technique & balance reactions.      Prosthetics   Prosthetic Care Comments  Lateral limb pain appears from limb volume chnges making fibula more prominent. Pt has an appt next week, Monday after PT. PT called to request increase relief oover distal fibula & increase knee extension assist. PT instructed to scoot forward with rotation so posterior left thigh clear of chair then press on top of left knee to engage TTA prosthesis. 1st attempt min   Current prosthetic wear tolerance (#hours/day)  TFA- wearing liner 6-7 hours 2x day, TTA- wearing liner consistently 6-7 hours 2x day, not wearing AKA prosthesis at all at home.    Residual limb condition  No open areas. prominent bones from typical limb volume changes.    Education Provided Proper wear schedule/adjustment;Proper weight-bearing schedule/adjustment;Proper Donning;Residual limb care;Skin check   Person(s) Educated Patient   Education Method Explanation;Demonstration;Tactile cues;Verbal cues   Education Method Verbalized understanding;Returned demonstration;Tactile cues required;Verbal cues required;Needs further instruction   Donning Prosthesis Minimal assist;Supervision  Min A first attempt, then SBA 2 more times                  PT Short Term Goals - 02/23/17 1242      PT SHORT TERM GOAL #1   Title Patient verbalizes proper cleaning of prostheses.    Time 4   Period Weeks   Status New   Target Date 03/24/17     PT SHORT TERM GOAL #2   Title Patient  tolerates wear of TTA prosthesis >8 hrs total /day & TFA prosthesis >2 hrs /day without skin issues.    Time 4   Period Weeks   Status On-going   Target Date 03/24/17     PT SHORT TERM GOAL #3   Title Sit to /from stand w/c to RW with supervision with prostheses.    Time 4   Period Weeks   Status New   Target Date 03/24/17     PT SHORT TERM GOAL #4   Title Patient reaches 5" anteriorly with RW support with prostheses with supervision.    Time 4   Period Weeks   Status On-going   Target Date 03/24/17     PT SHORT TERM GOAL #5   Title Patient ambulates 77' with RW & prostheses with modA of 1 person only.    Time 4   Period Weeks   Status New   Target Date 03/24/17           PT Long Term Goals - 02/23/17 1246      PT LONG  TERM GOAL #1   Title Patient verbalizes and demonstrates understanding of prosthetic care to enable safe use of prostheses.  (Target Date: 04/28/2017)   Time 4   Period Months   Status On-going   Target Date 04/28/17     PT LONG TERM GOAL #2   Title Patient tolerates wear of TTA prosthesis >90% and TFA >75% of awake hours to enable function throughout his day. (Target Date: 04/28/2017)   Time 4   Period Months   Status On-going   Target Date 04/28/17     PT LONG TERM GOAL #3   Title Patient performs transfers including sit to/from stand and stand pivot transfers with LRAD & prostheses modified independent.  (Target Date: 04/28/2017)   Time 4   Period Months   Status On-going   Target Date 04/28/17     PT LONG TERM GOAL #4   Title Patient performs standing balance with LRAD & prostheses reaching 10" anteriorly, reaching to floor, looking over shoulders and managing clothes modified independent.  (Target Date: 04/28/2017)   Time 4   Period Months   Status On-going   Target Date 04/28/17     PT LONG TERM GOAL #5   Title Patient ambulates 300' with LRAD & prostheses modified independent to enable community mobility.  (Target Date: 04/28/2017)    Time 4   Period Months   Status On-going   Target Date 04/28/17     PT LONG TERM GOAL #6   Title Patient negotiates ramps, curbs & stairs with LRAD & prostheses modified independent to enable community access.  (Target Date: 04/28/2017)   Time 4   Period Months   Status On-going   Target Date 04/28/17     PT LONG TERM GOAL #7   Title Activities of Balance Confidence score >15%.  (Target Date: 04/28/2017)   Baseline Initial ABC 0.0%   Time 4   Period Months   Status On-going   Target Date 04/28/17               Plan - 03/16/17 1852    Clinical Impression Statement Patient's lateral limb pain appears to be from limb volume change(shrinkage) making bones more prominant. PT requested prosthetist increase relief next week. Patient improved ability to donne TTA prosthesis. Patient negotiated full height curb today with assist.    Rehab Potential Good   PT Frequency 2x / week   PT Duration Other (comment)  4 months   PT Treatment/Interventions ADLs/Self Care Home Management;DME Instruction;Gait training;Stair training;Functional mobility training;Therapeutic activities;Therapeutic exercise;Balance training;Neuromuscular re-education;Patient/family education;Prosthetic Training;Manual techniques   PT Next Visit Plan check STGs.   Consulted and Agree with Plan of Care Patient      Patient will benefit from skilled therapeutic intervention in order to improve the following deficits and impairments:  Abnormal gait, Decreased activity tolerance, Decreased balance, Decreased endurance, Decreased knowledge of use of DME, Decreased mobility, Decreased range of motion, Decreased skin integrity, Decreased strength, Postural dysfunction, Prosthetic Dependency, Pain  Visit Diagnosis: Unsteadiness on feet  Muscle weakness (generalized)  Other abnormalities of gait and mobility  Contracture of muscle, multiple sites     Problem List Patient Active Problem List   Diagnosis Date  Noted  . History of right above knee amputation ([place]) 09/21/2016  . Acquired absence of left leg below knee ([place]) 09/21/2016  . Transaminitis   . Diabetic peripheral neuropathy ([place]) 07/21/2016  . Renal failure 07/04/2016  . Hyperuricemia 06/21/2016  . HTN (hypertension) 01/05/2016  . Diabetes  type 2, uncontrolled ([place]) 01/05/2016  . Hypothyroidism 05/10/2011    Kaulder Zahner PT, DPT 03/17/2017, 8:55 AM  [facility] [address] [place], [place], [address] Phone: [phone]   Fax:  [phone]  Name: Sasuke Yaffe MRN: [id number] Date of Birth: 1955/08/26

## 2017-03-20 ENCOUNTER — Ambulatory Visit: Payer: BLUE CROSS/BLUE SHIELD | Admitting: Physical Therapy

## 2017-03-20 ENCOUNTER — Encounter: Payer: Self-pay | Admitting: Physical Therapy

## 2017-03-20 DIAGNOSIS — M6281 Muscle weakness (generalized): Secondary | ICD-10-CM

## 2017-03-20 DIAGNOSIS — R2681 Unsteadiness on feet: Secondary | ICD-10-CM | POA: Diagnosis not present

## 2017-03-20 DIAGNOSIS — R2689 Other abnormalities of gait and mobility: Secondary | ICD-10-CM

## 2017-03-20 NOTE — Therapy (Signed)
[facility] [address] [place], [place], [address] Phone: [phone]   Fax:  [phone]  Physical Therapy Treatment  Patient Details  Name: Todd [name] MRN: [id number] Date of Birth: 17-Nov-1955 Referring Provider: [name], MD  Encounter Date: 03/20/2017      PT End of Session - 03/20/17 1517    Visit Number 20   Number of Visits 35   Date for PT Re-Evaluation 04/28/17   Authorization Type BCBS primary & Medicaid secondary   PT Start Time 1015   PT Stop Time 1055   PT Time Calculation (min) 40 min   Equipment Utilized During Treatment Gait belt   Activity Tolerance Patient limited by fatigue;Patient tolerated treatment well  Patient report pain at "knot"   Behavior During Therapy [facility] for tasks assessed/performed      Past Medical History:  Diagnosis Date  . AKI (acute kidney injury) ([place])   . Constipated   . Diabetes mellitus without complication ([place])   . Diarrhea   . Elevated LFTs   . Goiter   . Gout   . Hyperlipidemia   . Hypertension   . Leukocytosis   . Reactive thrombocytosis   . Right BKA infection ([place]) 08/2016  . Right leg pain   . Sepsis due to undetermined organism ([place])   . Thyroid disease   . Wound infection after surgery 08/2016    Past Surgical History:  Procedure Laterality Date  . ABDOMINAL AORTOGRAM N/A 08/11/2016   Procedure: Abdominal Aortogram;  Surgeon: Waynetta Sandy, MD;  Location: [facility];  Service: Cardiovascular;  Laterality: N/A;  . ABDOMINAL AORTOGRAM W/LOWER EXTREMITY N/A 08/15/2016   Procedure: Abdominal Aortogram w/Lower Extremity;  Surgeon: Elam Dutch, MD;  Location: [facility];  Service: Cardiovascular;  Laterality: N/A;  . AMPUTATION Right 08/17/2016   Procedure: RIGHT BELOW KNEE AMPUTATION;  Surgeon: Elam Dutch, MD;  Location: [facility];  Service: Vascular;  Laterality: Right;  . AMPUTATION Right 09/12/2016   Procedure:  AMPUTATION ABOVE KNEE;  Surgeon: Newt Minion, MD;  Location: [facility];  Service: Orthopedics;  Laterality: Right;  . AMPUTATION Left 08/12/2016   Procedure: LEFT BELOW KNEE AMPUTATION;  Surgeon: Newt Minion, MD;  Location: [facility];  Service: Orthopedics;  Laterality: Left;  . APPLICATION OF WOUND VAC Right 09/12/2016   Procedure: APPLICATION OF WOUND VAC ABOVE KNEE;  Surgeon: Newt Minion, MD;  Location: [facility];  Service: Orthopedics;  Laterality: Right;  . LOWER EXTREMITY ANGIOGRAPHY Bilateral 08/11/2016   Procedure: Lower Extremity Angiography;  Surgeon: Waynetta Sandy, MD;  Location: [facility];  Service: Cardiovascular;  Laterality: Bilateral;  . PERIPHERAL VASCULAR BALLOON ANGIOPLASTY Left 08/11/2016   Procedure: Peripheral Vascular Balloon Angioplasty;  Surgeon: Waynetta Sandy, MD;  Location: [facility];  Service: Cardiovascular;  Laterality: Left;  SFA  . THYROID SURGERY      There were no vitals filed for this visit.      Subjective Assessment - 03/20/17 1015    Subjective He is seeing prosthetist today after PT. He wore TTA prosthesis and got it on his limb with a little help from family.    Patient is accompained by: Family member   Pertinent History R  TFA 09/12/2016, L TTA 08/12/2016, gout, HTN, DM, Neuropathy, renal failure   Limitations Standing;Walking;House hold activities   Patient Stated Goals Use prostheses to get back into community. Get out of w/c.   Currently  in Pain? No/denies                         [facility] Adult PT Treatment/Exercise - 03/20/17 1015      Transfers   Transfers Sit to Stand;Stand to Sit   Sit to Stand 4: Min assist;3: Mod assist;With upper extremity assist;From chair/3-in-1;With armrests  to RW,  modA without RW stabilized & minA RW stabilized    Sit to Stand Details Verbal cues for technique;Tactile cues for weight shifting;Verbal cues for precautions/safety;Manual facilitation for weight shifting   Sit to  Stand Details (indicate cue type and reason) RW stabilized against counter & need to practice at home with family.    Stand to Sit 4: Min guard;5: Supervision;With upper extremity assist;With armrests;To chair/3-in-1  from RW   Stand to Sit Details (indicate cue type and reason) Verbal cues for sequencing;Verbal cues for technique;Verbal cues for precautions/safety     Ambulation/Gait   Ambulation/Gait Yes   Ambulation/Gait Assistance 4: Min assist   Ambulation/Gait Assistance Details verbal cues on posture, RW movement & step length / wt shift.    Ambulation Distance (Feet) 60 Feet   Assistive device Rolling walker;Prostheses   Gait Pattern Step-through pattern;Decreased stride length;Decreased trunk rotation;Trunk flexed;Wide base of support;Right flexed knee in stance   Ambulation Surface Indoor;Level   Curb 3: Mod assist  second person for safety, RW & prostheses   Curb Details (indicate cue type and reason) demo, verbal & tactile cues on technique including foot position to move RW, posture, sequence & wt shift.      Prosthetics   Prosthetic Care Comments  Pt donned TTA prosthesis with verbal cues only.    Current prosthetic wear tolerance (#hours/day)  TFA- wearing liner 6-7 hours 2x day, TTA- wearing liner consistently 6-7 hours 2x day, not wearing AKA prosthesis at all at home.    Residual limb condition  No open areas. prominent bones from typical limb volume changes.    Education Provided Proper wear schedule/adjustment;Proper weight-bearing schedule/adjustment;Proper Donning;Residual limb care;Skin check   Person(s) Educated Patient   Education Method Explanation;Verbal cues   Education Method Verbalized understanding;Verbal cues required;Needs further instruction                  PT Short Term Goals - 03/20/17 1518      PT SHORT TERM GOAL #1   Title Patient verbalizes proper cleaning of prostheses.    Baseline MET 03/20/17   Time 4   Period Weeks   Status  Achieved     PT SHORT TERM GOAL #2   Title Patient tolerates wear of TTA prosthesis >8 hrs total /day & TFA prosthesis >2 hrs /day without skin issues.    Time 4   Period Weeks   Status On-going     PT SHORT TERM GOAL #3   Title Sit to /from stand w/c to RW with supervision with prostheses.    Time 4   Period Weeks   Status On-going     PT SHORT TERM GOAL #4   Title Patient reaches 5" anteriorly with RW support with prostheses with supervision.    Time 4   Period Weeks   Status On-going     PT SHORT TERM GOAL #5   Title Patient ambulates 88' with RW & prostheses with modA of 1 person only.    Time 4   Period Weeks   Status On-going  PT Long Term Goals - 02/23/17 1246      PT LONG TERM GOAL #1   Title Patient verbalizes and demonstrates understanding of prosthetic care to enable safe use of prostheses.  (Target Date: 04/28/2017)   Time 4   Period Months   Status On-going   Target Date 04/28/17     PT LONG TERM GOAL #2   Title Patient tolerates wear of TTA prosthesis >90% and TFA >75% of awake hours to enable function throughout his day. (Target Date: 04/28/2017)   Time 4   Period Months   Status On-going   Target Date 04/28/17     PT LONG TERM GOAL #3   Title Patient performs transfers including sit to/from stand and stand pivot transfers with LRAD & prostheses modified independent.  (Target Date: 04/28/2017)   Time 4   Period Months   Status On-going   Target Date 04/28/17     PT LONG TERM GOAL #4   Title Patient performs standing balance with LRAD & prostheses reaching 10" anteriorly, reaching to floor, looking over shoulders and managing clothes modified independent.  (Target Date: 04/28/2017)   Time 4   Period Months   Status On-going   Target Date 04/28/17     PT LONG TERM GOAL #5   Title Patient ambulates 300' with LRAD & prostheses modified independent to enable community mobility.  (Target Date: 04/28/2017)   Time 4   Period Months    Status On-going   Target Date 04/28/17     PT LONG TERM GOAL #6   Title Patient negotiates ramps, curbs & stairs with LRAD & prostheses modified independent to enable community access.  (Target Date: 04/28/2017)   Time 4   Period Months   Status On-going   Target Date 04/28/17     PT LONG TERM GOAL #7   Title Activities of Balance Confidence score >15%.  (Target Date: 04/28/2017)   Baseline Initial ABC 0.0%   Time 4   Period Months   Status On-going   Target Date 04/28/17               Plan - 03/20/17 1519    Clinical Impression Statement Patient was limited by fatigue today more than previous sessions. Patient is able to donne TTA prosthesis now without assistance. He needs practice for sit to/from stand outside PT. Limited activities outside of PT is limiting his progress.    Rehab Potential Good   PT Frequency 2x / week   PT Duration Other (comment)  4 months   PT Treatment/Interventions ADLs/Self Care Home Management;DME Instruction;Gait training;Stair training;Functional mobility training;Therapeutic activities;Therapeutic exercise;Balance training;Neuromuscular re-education;Patient/family education;Prosthetic Training;Manual techniques   PT Next Visit Plan check STGs.    Consulted and Agree with Plan of Care Patient      Patient will benefit from skilled therapeutic intervention in order to improve the following deficits and impairments:  Abnormal gait, Decreased activity tolerance, Decreased balance, Decreased endurance, Decreased knowledge of use of DME, Decreased mobility, Decreased range of motion, Decreased skin integrity, Decreased strength, Postural dysfunction, Prosthetic Dependency, Pain  Visit Diagnosis: Unsteadiness on feet  Muscle weakness (generalized)  Other abnormalities of gait and mobility     Problem List Patient Active Problem List   Diagnosis Date Noted  . History of right above knee amputation ([place]) 09/21/2016  . Acquired absence of  left leg below knee ([place]) 09/21/2016  . Transaminitis   . Diabetic peripheral neuropathy ([place]) 07/21/2016  . Renal failure 07/04/2016  .  Hyperuricemia 06/21/2016  . HTN (hypertension) 01/05/2016  . Diabetes type 2, uncontrolled ([place]) 01/05/2016  . Hypothyroidism 05/10/2011    Jamey Reas PT, DPT 03/20/2017, 3:21 PM  [facility] [address] [place], [place], [address] Phone: [phone]   Fax:  [phone]  Name: Todd [name] MRN: [id number] Date of Birth: 1956-01-07

## 2017-03-22 ENCOUNTER — Encounter: Payer: Self-pay | Admitting: Physical Therapy

## 2017-03-22 ENCOUNTER — Ambulatory Visit: Payer: BLUE CROSS/BLUE SHIELD | Admitting: Physical Therapy

## 2017-03-22 DIAGNOSIS — R2681 Unsteadiness on feet: Secondary | ICD-10-CM | POA: Diagnosis not present

## 2017-03-22 DIAGNOSIS — R2689 Other abnormalities of gait and mobility: Secondary | ICD-10-CM

## 2017-03-22 DIAGNOSIS — M6249 Contracture of muscle, multiple sites: Secondary | ICD-10-CM

## 2017-03-22 DIAGNOSIS — M6281 Muscle weakness (generalized): Secondary | ICD-10-CM

## 2017-03-22 NOTE — Therapy (Signed)
[facility] [address] [place], [place], [address] Phone: [phone]   Fax:  [phone]  Physical Therapy Treatment  Patient Details  Name: Todd [name] MRN: [id number] Date of Birth: 01/20/1956 Referring Provider: [name], MD  Encounter Date: 03/22/2017      PT End of Session - 03/22/17 2132    Visit Number 21   Number of Visits 35   Date for PT Re-Evaluation 04/28/17   Authorization Type BCBS primary & Medicaid secondary   PT Start Time 1015   PT Stop Time 1100   PT Time Calculation (min) 45 min   Equipment Utilized During Treatment Gait belt   Activity Tolerance Patient limited by fatigue;Patient tolerated treatment well  Patient report pain at "knot"   Behavior During Therapy [facility] for tasks assessed/performed      Past Medical History:  Diagnosis Date  . AKI (acute kidney injury) ([place])   . Constipated   . Diabetes mellitus without complication ([place])   . Diarrhea   . Elevated LFTs   . Goiter   . Gout   . Hyperlipidemia   . Hypertension   . Leukocytosis   . Reactive thrombocytosis   . Right BKA infection ([place]) 08/2016  . Right leg pain   . Sepsis due to undetermined organism ([place])   . Thyroid disease   . Wound infection after surgery 08/2016    Past Surgical History:  Procedure Laterality Date  . ABDOMINAL AORTOGRAM N/A 08/11/2016   Procedure: Abdominal Aortogram;  Surgeon: Waynetta Sandy, MD;  Location: [facility];  Service: Cardiovascular;  Laterality: N/A;  . ABDOMINAL AORTOGRAM W/LOWER EXTREMITY N/A 08/15/2016   Procedure: Abdominal Aortogram w/Lower Extremity;  Surgeon: Elam Dutch, MD;  Location: [place] [facility];  Service: Cardiovascular;  Laterality: N/A;  . AMPUTATION Right 08/17/2016   Procedure: RIGHT BELOW KNEE AMPUTATION;  Surgeon: Elam Dutch, MD;  Location: [facility];  Service: Vascular;  Laterality: Right;  . AMPUTATION Right 09/12/2016   Procedure:  AMPUTATION ABOVE KNEE;  Surgeon: Newt Minion, MD;  Location: [facility];  Service: Orthopedics;  Laterality: Right;  . AMPUTATION Left 08/12/2016   Procedure: LEFT BELOW KNEE AMPUTATION;  Surgeon: Newt Minion, MD;  Location: [facility];  Service: Orthopedics;  Laterality: Left;  . APPLICATION OF WOUND VAC Right 09/12/2016   Procedure: APPLICATION OF WOUND VAC ABOVE KNEE;  Surgeon: Newt Minion, MD;  Location: [facility];  Service: Orthopedics;  Laterality: Right;  . LOWER EXTREMITY ANGIOGRAPHY Bilateral 08/11/2016   Procedure: Lower Extremity Angiography;  Surgeon: Waynetta Sandy, MD;  Location: [facility];  Service: Cardiovascular;  Laterality: Bilateral;  . PERIPHERAL VASCULAR BALLOON ANGIOPLASTY Left 08/11/2016   Procedure: Peripheral Vascular Balloon Angioplasty;  Surgeon: Waynetta Sandy, MD;  Location: [facility];  Service: Cardiovascular;  Laterality: Left;  SFA  . THYROID SURGERY      There were no vitals filed for this visit.      Subjective Assessment - 03/22/17 1015    Subjective He saw prosthetist who worked on left leg and it feels better.      Patient is accompained by: Family member   Pertinent History R  TFA 09/12/2016, L TTA 08/12/2016, gout, HTN, DM, Neuropathy, renal failure   Limitations Standing;Walking;House hold activities   Patient Stated Goals Use prostheses to get back into community. Get out of w/c.   Currently in Pain? No/denies  [facility] Adult PT Treatment/Exercise - 03/22/17 1015      Transfers   Transfers Sit to Stand;Stand to Sit   Sit to Stand 3: Mod assist;4: Min assist;With upper extremity assist;With armrests;From chair/3-in-1  to RW modA & to sink MinA   Sit to Stand Details Tactile cues for weight shifting   Stand to Sit 5: Supervision;With upper extremity assist;With armrests;To chair/3-in-1  from RW     Ambulation/Gait   Ambulation/Gait Yes   Ambulation/Gait Assistance 4: Min assist    Ambulation/Gait Assistance Details verbal cues on posture, step length & wt shift   Ambulation Distance (Feet) 75 Feet   Assistive device Rolling walker;Prostheses   Curb 3: Mod assist  RW & TTA/TFA prostheses   Curb Details (indicate cue type and reason) verbal cues on sequence & technique; pt able to move RW without assist.      Prosthetics   Prosthetic Care Comments  PT instructed to wear TTA prosthesis & TFA liner all awake hours. Use TTA prosthesis for transfers to w/c, toilet, couch & car.    Current prosthetic wear tolerance (days/week)  daily   Education Provided Other (comment);Proper Donning;Proper wear schedule/adjustment  see prosthetic care   Person(s) Educated Patient   Education Method Explanation;Verbal cues   Education Method Verbalized understanding;Needs further instruction   Donning Prosthesis Modified independent (device/increased time);Supervision  TTA modified indep. TFA supervision to tighten strap p stand   Doffing Prosthesis Modified independent (device/increased time)                  PT Short Term Goals - 03/22/17 2133      PT SHORT TERM GOAL #1   Title Patient verbalizes proper cleaning of prostheses.    Baseline MET 03/20/17   Time 4   Period Weeks   Status Achieved     PT SHORT TERM GOAL #2   Title Patient tolerates wear of TTA prosthesis >8 hrs total /day & TFA prosthesis >2 hrs /day without skin issues.    Baseline Partially MET 03/22/17 Pt reports wearing TTA prosthesis 6-8 hrs  but not wearing TFA prosthesis, liner only   Time 4   Period Weeks   Status Partially Met     PT SHORT TERM GOAL #3   Title Sit to /from stand w/c to RW with supervision with prostheses.    Baseline 03/22/17 NOT MET ModA to RW & MinA to sink   Time 4   Period Weeks   Status Not Met     PT SHORT TERM GOAL #4   Title Patient reaches 5" anteriorly with RW support with prostheses with supervision.    Baseline MET 03/22/17   Time 4   Period Weeks   Status  Achieved     PT SHORT TERM GOAL #5   Title Patient ambulates 65' with RW & prostheses with modA of 1 person only.    Baseline MET 03/22/17   Time 4   Period Weeks   Status Achieved           PT Long Term Goals - 02/23/17 1246      PT LONG TERM GOAL #1   Title Patient verbalizes and demonstrates understanding of prosthetic care to enable safe use of prostheses.  (Target Date: 04/28/2017)   Time 4   Period Months   Status On-going   Target Date 04/28/17     PT LONG TERM GOAL #2   Title Patient tolerates wear of TTA prosthesis >90% and TFA >75%  of awake hours to enable function throughout his day. (Target Date: 04/28/2017)   Time 4   Period Months   Status On-going   Target Date 04/28/17     PT LONG TERM GOAL #3   Title Patient performs transfers including sit to/from stand and stand pivot transfers with LRAD & prostheses modified independent.  (Target Date: 04/28/2017)   Time 4   Period Months   Status On-going   Target Date 04/28/17     PT LONG TERM GOAL #4   Title Patient performs standing balance with LRAD & prostheses reaching 10" anteriorly, reaching to floor, looking over shoulders and managing clothes modified independent.  (Target Date: 04/28/2017)   Time 4   Period Months   Status On-going   Target Date 04/28/17     PT LONG TERM GOAL #5   Title Patient ambulates 300' with LRAD & prostheses modified independent to enable community mobility.  (Target Date: 04/28/2017)   Time 4   Period Months   Status On-going   Target Date 04/28/17     PT LONG TERM GOAL #6   Title Patient negotiates ramps, curbs & stairs with LRAD & prostheses modified independent to enable community access.  (Target Date: 04/28/2017)   Time 4   Period Months   Status On-going   Target Date 04/28/17     PT LONG TERM GOAL #7   Title Activities of Balance Confidence score >15%.  (Target Date: 04/28/2017)   Baseline Initial ABC 0.0%   Time 4   Period Months   Status On-going   Target  Date 04/28/17               Plan - 03/22/17 2135    Clinical Impression Statement Patient met or partially met 4 of 5 STGs. He improved gait on level surfaces and reaching. He still struggles with sit to stand with limited ability to practice outside of PT.    Rehab Potential Good   PT Frequency 2x / week   PT Duration Other (comment)  4 months   PT Treatment/Interventions ADLs/Self Care Home Management;DME Instruction;Gait training;Stair training;Functional mobility training;Therapeutic activities;Therapeutic exercise;Balance training;Neuromuscular re-education;Patient/family education;Prosthetic Training;Manual techniques   PT Next Visit Plan continue towards LTGs   Consulted and Agree with Plan of Care Patient      Patient will benefit from skilled therapeutic intervention in order to improve the following deficits and impairments:  Abnormal gait, Decreased activity tolerance, Decreased balance, Decreased endurance, Decreased knowledge of use of DME, Decreased mobility, Decreased range of motion, Decreased skin integrity, Decreased strength, Postural dysfunction, Prosthetic Dependency, Pain  Visit Diagnosis: Unsteadiness on feet  Muscle weakness (generalized)  Other abnormalities of gait and mobility  Contracture of muscle, multiple sites     Problem List Patient Active Problem List   Diagnosis Date Noted  . History of right above knee amputation ([place]) 09/21/2016  . Acquired absence of left leg below knee ([place]) 09/21/2016  . Transaminitis   . Diabetic peripheral neuropathy ([place]) 07/21/2016  . Renal failure 07/04/2016  . Hyperuricemia 06/21/2016  . HTN (hypertension) 01/05/2016  . Diabetes type 2, uncontrolled ([place]) 01/05/2016  . Hypothyroidism 05/10/2011    Jamey Reas PT, DPT 03/22/2017, 9:37 PM  [facility] [address] [place], [place], [address] Phone: [phone]   Fax:   [phone]  Name: Odies Desa MRN: [id number] Date of Birth: [birth year]

## 2017-03-27 ENCOUNTER — Encounter: Payer: Self-pay | Admitting: Physical Therapy

## 2017-03-27 ENCOUNTER — Ambulatory Visit: Payer: BLUE CROSS/BLUE SHIELD | Admitting: Physical Therapy

## 2017-03-27 DIAGNOSIS — R2689 Other abnormalities of gait and mobility: Secondary | ICD-10-CM

## 2017-03-27 DIAGNOSIS — M6281 Muscle weakness (generalized): Secondary | ICD-10-CM

## 2017-03-27 DIAGNOSIS — R2681 Unsteadiness on feet: Secondary | ICD-10-CM | POA: Diagnosis not present

## 2017-03-27 NOTE — Therapy (Signed)
[facility] [address] [place], [place], [address] Phone: [phone]   Fax:  [phone]  Physical Therapy Treatment  Patient Details  Name: Todd [name] MRN: [id number] Date of Birth: 24-Sep-1955 Referring Provider: [name], MD  Encounter Date: 03/27/2017      PT End of Session - 03/27/17 1020    Visit Number 22   Number of Visits 35   Date for PT Re-Evaluation 04/28/17   Authorization Type BCBS primary & Medicaid secondary   PT Start Time 1015   PT Stop Time 1100   PT Time Calculation (min) 45 min   Equipment Utilized During Treatment Gait belt   Activity Tolerance Patient limited by fatigue;Patient tolerated treatment well  Patient report pain at "knot"   Behavior During Therapy [facility] for tasks assessed/performed      Past Medical History:  Diagnosis Date  . AKI (acute kidney injury) ([place])   . Constipated   . Diabetes mellitus without complication ([place])   . Diarrhea   . Elevated LFTs   . Goiter   . Gout   . Hyperlipidemia   . Hypertension   . Leukocytosis   . Reactive thrombocytosis   . Right BKA infection ([place]) 08/2016  . Right leg pain   . Sepsis due to undetermined organism ([place])   . Thyroid disease   . Wound infection after surgery 08/2016    Past Surgical History:  Procedure Laterality Date  . ABDOMINAL AORTOGRAM N/A 08/11/2016   Procedure: Abdominal Aortogram;  Surgeon: Waynetta Sandy, MD;  Location: [facility];  Service: Cardiovascular;  Laterality: N/A;  . ABDOMINAL AORTOGRAM W/LOWER EXTREMITY N/A 08/15/2016   Procedure: Abdominal Aortogram w/Lower Extremity;  Surgeon: Elam Dutch, MD;  Location: [facility];  Service: Cardiovascular;  Laterality: N/A;  . AMPUTATION Right 08/17/2016   Procedure: RIGHT BELOW KNEE AMPUTATION;  Surgeon: Elam Dutch, MD;  Location: [place];  Service: Vascular;  Laterality: Right;  . AMPUTATION Right 09/12/2016   Procedure:  AMPUTATION ABOVE KNEE;  Surgeon: Newt Minion, MD;  Location: [facility];  Service: Orthopedics;  Laterality: Right;  . AMPUTATION Left 08/12/2016   Procedure: LEFT BELOW KNEE AMPUTATION;  Surgeon: Newt Minion, MD;  Location: [facility];  Service: Orthopedics;  Laterality: Left;  . APPLICATION OF WOUND VAC Right 09/12/2016   Procedure: APPLICATION OF WOUND VAC ABOVE KNEE;  Surgeon: Newt Minion, MD;  Location: [facility];  Service: Orthopedics;  Laterality: Right;  . LOWER EXTREMITY ANGIOGRAPHY Bilateral 08/11/2016   Procedure: Lower Extremity Angiography;  Surgeon: Waynetta Sandy, MD;  Location: [facility];  Service: Cardiovascular;  Laterality: Bilateral;  . PERIPHERAL VASCULAR BALLOON ANGIOPLASTY Left 08/11/2016   Procedure: Peripheral Vascular Balloon Angioplasty;  Surgeon: Waynetta Sandy, MD;  Location: [facility];  Service: Cardiovascular;  Laterality: Left;  SFA  . THYROID SURGERY      There were no vitals filed for this visit.      Subjective Assessment - 03/27/17 1019    Subjective No new falls to report. No pain at this time.    Patient is accompained by: Family member   Pertinent History R  TFA 09/12/2016, L TTA 08/12/2016, gout, HTN, DM, Neuropathy, renal failure   Limitations Standing;Walking;House hold activities   Patient Stated Goals Use prostheses to get back into community. Get out of w/c.   Currently in Pain? No/denies   Pain Score 0-No pain  [facility] Adult PT Treatment/Exercise - 03/27/17 1021      Transfers   Transfers Sit to Stand;Stand to Sit   Sit to Stand 4: Min assist;4: Min guard;5: Supervision   Sit to Stand Details Verbal cues for sequencing;Verbal cues for technique;Verbal cues for safe use of DME/AE   Sit to Stand Details (indicate cue type and reason) min assist x2 wheelchair to RW without out RW propped against coutner/cabinets. progressed to supervision with assist to stabilize left side of RW only with RW propped against  counter/cabinets. had pt's mom hold RW with 1 rep as they would do at home with no issues. reminder cues to lock knee and slide it back with 2/6 reps.                     Stand to Sit 5: Supervision   Stand to Sit Details (indicate cue type and reason) Verbal cues for sequencing;Verbal cues for technique;Verbal cues for safe use of DME/AE   Stand to Sit Details increased time with no physical assistance needed.      Ambulation/Gait   Ambulation/Gait Yes   Ambulation/Gait Assistance 4: Min assist   Ambulation/Gait Assistance Details cues on posture, sequencing and step placement/length with gait.    Ambulation Distance (Feet) 14 Feet   Assistive device Rolling walker;Prostheses   Gait Pattern Step-through pattern;Decreased stride length;Decreased trunk rotation;Trunk flexed;Wide base of support;Right flexed knee in stance   Ambulation Surface Level;Indoor     Prosthetics   Prosthetic Care Comments  reinforced daily wear of below knee prosthesis and use with activity. also reinforced need of activity outside of PT to progress mobility.   Current prosthetic wear tolerance (days/week)  has worn the left prosthesis 2 days since last session, liners everyday.    Current prosthetic wear tolerance (#hours/day)  TFA- wearing liner up to 8-10 hours a day, TTA- wearing liner every day, has worn prosthesis 2 days only since last visit, 3 hours 2 x day on those days.                         Residual limb condition  No open areas. prominent bones from typical limb volume changes.    Education Provided Proper wear schedule/adjustment;Proper weight-bearing schedule/adjustment;Other (comment)  see above comments   Person(s) Educated Patient;Parent(s);Caregiver(s)   Education Method Explanation;Demonstration;Verbal cues   Education Method Verbalized understanding;Verbal cues required;Needs further instruction   Donning Prosthesis Modified independent (device/increased time);Supervision   Doffing Prosthesis  Modified independent (device/increased time)            PT Short Term Goals - 03/22/17 2133      PT SHORT TERM GOAL #1   Title Patient verbalizes proper cleaning of prostheses.    Baseline MET 03/20/17   Time 4   Period Weeks   Status Achieved     PT SHORT TERM GOAL #2   Title Patient tolerates wear of TTA prosthesis >8 hrs total /day & TFA prosthesis >2 hrs /day without skin issues.    Baseline Partially MET 03/22/17 Pt reports wearing TTA prosthesis 6-8 hrs  but not wearing TFA prosthesis, liner only   Time 4   Period Weeks   Status Partially Met     PT SHORT TERM GOAL #3   Title Sit to /from stand w/c to RW with supervision with prostheses.    Baseline 03/22/17 NOT MET ModA to RW & MinA to sink   Time 4   Period  Weeks   Status Not Met     PT SHORT TERM GOAL #4   Title Patient reaches 5" anteriorly with RW support with prostheses with supervision.    Baseline MET 03/22/17   Time 4   Period Weeks   Status Achieved     PT SHORT TERM GOAL #5   Title Patient ambulates 64' with RW & prostheses with modA of 1 person only.    Baseline MET 03/22/17   Time 4   Period Weeks   Status Achieved           PT Long Term Goals - 02/23/17 1246      PT LONG TERM GOAL #1   Title Patient verbalizes and demonstrates understanding of prosthetic care to enable safe use of prostheses.  (Target Date: 04/28/2017)   Time 4   Period Months   Status On-going   Target Date 04/28/17     PT LONG TERM GOAL #2   Title Patient tolerates wear of TTA prosthesis >90% and TFA >75% of awake hours to enable function throughout his day. (Target Date: 04/28/2017)   Time 4   Period Months   Status On-going   Target Date 04/28/17     PT LONG TERM GOAL #3   Title Patient performs transfers including sit to/from stand and stand pivot transfers with LRAD & prostheses modified independent.  (Target Date: 04/28/2017)   Time 4   Period Months   Status On-going   Target Date 04/28/17     PT LONG  TERM GOAL #4   Title Patient performs standing balance with LRAD & prostheses reaching 10" anteriorly, reaching to floor, looking over shoulders and managing clothes modified independent.  (Target Date: 04/28/2017)   Time 4   Period Months   Status On-going   Target Date 04/28/17     PT LONG TERM GOAL #5   Title Patient ambulates 300' with LRAD & prostheses modified independent to enable community mobility.  (Target Date: 04/28/2017)   Time 4   Period Months   Status On-going   Target Date 04/28/17     PT LONG TERM GOAL #6   Title Patient negotiates ramps, curbs & stairs with LRAD & prostheses modified independent to enable community access.  (Target Date: 04/28/2017)   Time 4   Period Months   Status On-going   Target Date 04/28/17     PT LONG TERM GOAL #7   Title Activities of Balance Confidence score >15%.  (Target Date: 04/28/2017)   Baseline Initial ABC 0.0%   Time 4   Period Months   Status On-going   Target Date 04/28/17            Plan - 03/27/17 1021    Clinical Impression Statement Today's skilled session focused on use of bil prosthesis with sit/stand and gait. Worked on a technique pt can do at home with mom holding RW only due to her inablility to assist him with standing. Pt progressed to supervision only for standing while mom stabilized RW on left side. Pt is making progress toward goals and should benefit from continued PT to progress toward unmet goals.    Rehab Potential Good   PT Frequency 2x / week   PT Duration Other (comment)  4 months   PT Treatment/Interventions ADLs/Self Care Home Management;DME Instruction;Gait training;Stair training;Functional mobility training;Therapeutic activities;Therapeutic exercise;Balance training;Neuromuscular re-education;Patient/family education;Prosthetic Training;Manual techniques   PT Next Visit Plan continue towards LTGs   Consulted and Agree with Plan of Care  Patient      Patient will benefit from skilled  therapeutic intervention in order to improve the following deficits and impairments:  Abnormal gait, Decreased activity tolerance, Decreased balance, Decreased endurance, Decreased knowledge of use of DME, Decreased mobility, Decreased range of motion, Decreased skin integrity, Decreased strength, Postural dysfunction, Prosthetic Dependency, Pain  Visit Diagnosis: Unsteadiness on feet  Muscle weakness (generalized)  Other abnormalities of gait and mobility     Problem List Patient Active Problem List   Diagnosis Date Noted  . History of right above knee amputation ([place]) 09/21/2016  . Acquired absence of left leg below knee ([place]) 09/21/2016  . Transaminitis   . Diabetic peripheral neuropathy ([place]) 07/21/2016  . Renal failure 07/04/2016  . Hyperuricemia 06/21/2016  . HTN (hypertension) 01/05/2016  . Diabetes type 2, uncontrolled ([place]) 01/05/2016  . Hypothyroidism 05/10/2011    Willow Ora, PTA, [facility] [address], [place], [place] [address] [phone] 03/27/17, 12:04 PM   Name: Todd [name] MRN: [id number] Date of Birth: November 30, 1955

## 2017-03-29 ENCOUNTER — Ambulatory Visit: Payer: BLUE CROSS/BLUE SHIELD | Admitting: Physical Therapy

## 2017-03-29 ENCOUNTER — Encounter: Payer: Self-pay | Admitting: Physical Therapy

## 2017-03-29 DIAGNOSIS — R2681 Unsteadiness on feet: Secondary | ICD-10-CM

## 2017-03-29 DIAGNOSIS — M6281 Muscle weakness (generalized): Secondary | ICD-10-CM

## 2017-03-29 DIAGNOSIS — R2689 Other abnormalities of gait and mobility: Secondary | ICD-10-CM

## 2017-03-29 NOTE — Therapy (Signed)
[facility] [address] [place], [place], [address] Phone: [phone]   Fax:  [phone]  Physical Therapy Treatment  Patient Details  Name: Todd [name] MRN: [id number] Date of Birth: [birth year] Referring Provider: [name], MD  Encounter Date: 03/29/2017      PT End of Session - 03/29/17 1226    Visit Number 23   Number of Visits 35   Date for PT Re-Evaluation 04/28/17   Authorization Type BCBS primary & Medicaid secondary   PT Start Time 1015   PT Stop Time 1100   PT Time Calculation (min) 45 min   Equipment Utilized During Treatment Gait belt   Activity Tolerance Patient limited by fatigue;Patient tolerated treatment well  Patient report pain at "knot"   Behavior During Therapy [facility] for tasks assessed/performed      Past Medical History:  Diagnosis Date  . AKI (acute kidney injury) ([place])   . Constipated   . Diabetes mellitus without complication ([place])   . Diarrhea   . Elevated LFTs   . Goiter   . Gout   . Hyperlipidemia   . Hypertension   . Leukocytosis   . Reactive thrombocytosis   . Right BKA infection ([place]) 08/2016  . Right leg pain   . Sepsis due to undetermined organism ([place])   . Thyroid disease   . Wound infection after surgery 08/2016    Past Surgical History:  Procedure Laterality Date  . ABDOMINAL AORTOGRAM N/A 08/11/2016   Procedure: Abdominal Aortogram;  Surgeon: Waynetta Sandy, MD;  Location: [place] [facility];  Service: Cardiovascular;  Laterality: N/A;  . ABDOMINAL AORTOGRAM W/LOWER EXTREMITY N/A 08/15/2016   Procedure: Abdominal Aortogram w/Lower Extremity;  Surgeon: Elam Dutch, MD;  Location: [place] [facility];  Service: Cardiovascular;  Laterality: N/A;  . AMPUTATION Right 08/17/2016   Procedure: RIGHT BELOW KNEE AMPUTATION;  Surgeon: Elam Dutch, MD;  Location: [place];  Service: Vascular;  Laterality: Right;  . AMPUTATION Right 09/12/2016   Procedure:  AMPUTATION ABOVE KNEE;  Surgeon: Newt Minion, MD;  Location: [facility];  Service: Orthopedics;  Laterality: Right;  . AMPUTATION Left 08/12/2016   Procedure: LEFT BELOW KNEE AMPUTATION;  Surgeon: Newt Minion, MD;  Location: [place];  Service: Orthopedics;  Laterality: Left;  . APPLICATION OF WOUND VAC Right 09/12/2016   Procedure: APPLICATION OF WOUND VAC ABOVE KNEE;  Surgeon: Newt Minion, MD;  Location: [facility];  Service: Orthopedics;  Laterality: Right;  . LOWER EXTREMITY ANGIOGRAPHY Bilateral 08/11/2016   Procedure: Lower Extremity Angiography;  Surgeon: Waynetta Sandy, MD;  Location: [place];  Service: Cardiovascular;  Laterality: Bilateral;  . PERIPHERAL VASCULAR BALLOON ANGIOPLASTY Left 08/11/2016   Procedure: Peripheral Vascular Balloon Angioplasty;  Surgeon: Waynetta Sandy, MD;  Location: [facility];  Service: Cardiovascular;  Laterality: Left;  SFA  . THYROID SURGERY      There were no vitals filed for this visit.      Subjective Assessment - 03/29/17 1021    Subjective Left residual limb (bone spur) "wants to throb" at night.   Patient is accompained by: Family member   Pertinent History R  TFA 09/12/2016, L TTA 08/12/2016, gout, HTN, DM, Neuropathy, renal failure   Limitations Standing;Walking;House hold activities   Patient Stated Goals Use prostheses to get back into community. Get out of w/c.   Currently in Pain? Yes   Pain Score 2    Pain Location Leg  Pain Orientation Left   Pain Descriptors / Indicators Aching   Pain Onset More than a month ago   Pain Frequency Intermittent   Aggravating Factors  weight  bearing   Pain Relieving Factors sitting                         [facility] Adult PT Treatment/Exercise - 03/29/17 0001      Transfers   Transfers Sit to Stand;Stand to Sit   Sit to Stand 4: Min assist   Sit to Stand Details Verbal cues for sequencing;Verbal cues for technique;Verbal cues for safe use of DME/AE   Sit to  Stand Details (indicate cue type and reason) Multiple attempts for sit<>stand with walker from w/c without cabinet support unable fully straight prostheses reporting burning/pain in L residual limb (up to a 6/10); with cabinet in front of RW for stability, pt was able to perform 1 sit<>stand but declined 2nd attempt due to burning in L residual limb.                                                Stand to Sit 5: Supervision;4: Min assist  Min A when RW was not propped against cabinet   Stand to Sit Details (indicate cue type and reason) Verbal cues for sequencing;Verbal cues for technique;Verbal cues for safe use of DME/AE   Stand to Sit Details increase time with pt reporting limitation due to pain     Prosthetics   Prosthetic Care Comments  reinforced daily wear of below knee prosthesis and use with activity. also reinforced need of activity outside of PT to progress mobility.   Current prosthetic wear tolerance (days/week)  daily   Current prosthetic wear tolerance (#hours/day)  TFA- wearing liner up to 8-10 hours a day, TTA- wearing liner every day, has worn prosthesis daily since last visit,2- 3 hours 2 x day on those days.                         Residual limb condition  no issues per pt   Education Provided Proper wear schedule/adjustment;Proper weight-bearing schedule/adjustment;Other (comment)   Person(s) Educated Patient   Education Method Explanation   Education Method Verbalized understanding;Needs further instruction   Donning Prosthesis Supervision   Doffing Prosthesis Modified independent (device/increased time)                PT Education - 03/29/17 1224    Education provided Yes   Education Details Education for consistency of wearing prostheses and adjustment that residual limbs need to make with weight bearing; pain vs pressure with weight bearing.   Person(s) Educated Patient   Methods Explanation   Comprehension Verbalized understanding          PT Short  Term Goals - 03/22/17 2133      PT SHORT TERM GOAL #1   Title Patient verbalizes proper cleaning of prostheses.    Baseline MET 03/20/17   Time 4   Period Weeks   Status Achieved     PT SHORT TERM GOAL #2   Title Patient tolerates wear of TTA prosthesis >8 hrs total /day & TFA prosthesis >2 hrs /day without skin issues.    Baseline Partially MET 03/22/17 Pt reports wearing TTA prosthesis 6-8 hrs  but not wearing TFA prosthesis,  liner only   Time 4   Period Weeks   Status Partially Met     PT SHORT TERM GOAL #3   Title Sit to /from stand w/c to RW with supervision with prostheses.    Baseline 03/22/17 NOT MET ModA to RW & MinA to sink   Time 4   Period Weeks   Status Not Met     PT SHORT TERM GOAL #4   Title Patient reaches 5" anteriorly with RW support with prostheses with supervision.    Baseline MET 03/22/17   Time 4   Period Weeks   Status Achieved     PT SHORT TERM GOAL #5   Title Patient ambulates 24' with RW & prostheses with modA of 1 person only.    Baseline MET 03/22/17   Time 4   Period Weeks   Status Achieved           PT Long Term Goals - 02/23/17 1246      PT LONG TERM GOAL #1   Title Patient verbalizes and demonstrates understanding of prosthetic care to enable safe use of prostheses.  (Target Date: 04/28/2017)   Time 4   Period Months   Status On-going   Target Date 04/28/17     PT LONG TERM GOAL #2   Title Patient tolerates wear of TTA prosthesis >90% and TFA >75% of awake hours to enable function throughout his day. (Target Date: 04/28/2017)   Time 4   Period Months   Status On-going   Target Date 04/28/17     PT LONG TERM GOAL #3   Title Patient performs transfers including sit to/from stand and stand pivot transfers with LRAD & prostheses modified independent.  (Target Date: 04/28/2017)   Time 4   Period Months   Status On-going   Target Date 04/28/17     PT LONG TERM GOAL #4   Title Patient performs standing balance with LRAD &  prostheses reaching 10" anteriorly, reaching to floor, looking over shoulders and managing clothes modified independent.  (Target Date: 04/28/2017)   Time 4   Period Months   Status On-going   Target Date 04/28/17     PT LONG TERM GOAL #5   Title Patient ambulates 300' with LRAD & prostheses modified independent to enable community mobility.  (Target Date: 04/28/2017)   Time 4   Period Months   Status On-going   Target Date 04/28/17     PT LONG TERM GOAL #6   Title Patient negotiates ramps, curbs & stairs with LRAD & prostheses modified independent to enable community access.  (Target Date: 04/28/2017)   Time 4   Period Months   Status On-going   Target Date 04/28/17     PT LONG TERM GOAL #7   Title Activities of Balance Confidence score >15%.  (Target Date: 04/28/2017)   Baseline Initial ABC 0.0%   Time 4   Period Months   Status On-going   Target Date 04/28/17               Plan - 03/29/17 1226    Clinical Impression Statement Pt reported a lot of pain in L residual limb with transition from sit<>stand limiting tolarance to transfer training. Eduacted pt on the adjustment that residual limbs need to make with weight bearing; pain vs pressure with weight bearing.   Rehab Potential Good   PT Frequency 2x / week   PT Duration Other (comment)  4 months   PT Treatment/Interventions ADLs/Self  Care Home Management;DME Instruction;Gait training;Stair training;Functional mobility training;Therapeutic activities;Therapeutic exercise;Balance training;Neuromuscular re-education;Patient/family education;Prosthetic Training;Manual techniques   PT Next Visit Plan continue towards LTGs   Consulted and Agree with Plan of Care Patient      Patient will benefit from skilled therapeutic intervention in order to improve the following deficits and impairments:  Abnormal gait, Decreased activity tolerance, Decreased balance, Decreased endurance, Decreased knowledge of use of DME,  Decreased mobility, Decreased range of motion, Decreased skin integrity, Decreased strength, Postural dysfunction, Prosthetic Dependency, Pain  Visit Diagnosis: Unsteadiness on feet  Muscle weakness (generalized)  Other abnormalities of gait and mobility     Problem List Patient Active Problem List   Diagnosis Date Noted  . History of right above knee amputation ([place]) 09/21/2016  . Acquired absence of left leg below knee ([place]) 09/21/2016  . Transaminitis   . Diabetic peripheral neuropathy ([place]) 07/21/2016  . Renal failure 07/04/2016  . Hyperuricemia 06/21/2016  . HTN (hypertension) 01/05/2016  . Diabetes type 2, uncontrolled ([place]) 01/05/2016  . Hypothyroidism 05/10/2011    Bjorn Loser, PTA  03/29/17, 12:30 PM [facility] [address] [place], [place], [address] Phone: [phone]   Fax:  [phone]  Name: Todd [name] MRN: [id number] Date of Birth: Jul 28, 1955

## 2017-03-31 ENCOUNTER — Telehealth: Payer: Self-pay | Admitting: *Deleted

## 2017-03-31 NOTE — Telephone Encounter (Signed)
Spoke with Nelwyn Salisbury mother, when she was here for her annual wellness visit. She is caring for him since he has had both lower legs amputated and she wants to know if there is any way she can get some assistance with him. Since he has Pharmacist, community and not Medicare or Medicaid I wasn't sure what he would qualify for. If he qualified for Medicaid the then CAP would be an option. I will discuss this within the office and see what options he may have.

## 2017-04-03 ENCOUNTER — Ambulatory Visit: Payer: BLUE CROSS/BLUE SHIELD | Admitting: Physical Therapy

## 2017-04-03 NOTE — Telephone Encounter (Signed)
Spoke with patient's sister. He is on a waiting list for CAP in [facility] and has a meeting tomorrow to apply for Medicaid.

## 2017-04-05 ENCOUNTER — Encounter: Payer: Self-pay | Admitting: Physical Therapy

## 2017-04-05 ENCOUNTER — Ambulatory Visit: Payer: BLUE CROSS/BLUE SHIELD | Admitting: Physical Therapy

## 2017-04-05 DIAGNOSIS — R2681 Unsteadiness on feet: Secondary | ICD-10-CM | POA: Diagnosis not present

## 2017-04-05 DIAGNOSIS — M6281 Muscle weakness (generalized): Secondary | ICD-10-CM

## 2017-04-05 DIAGNOSIS — R2689 Other abnormalities of gait and mobility: Secondary | ICD-10-CM

## 2017-04-05 NOTE — Therapy (Signed)
[facility] [address] [place], [place], [address] Phone: [phone]   Fax:  [phone]  Physical Therapy Treatment  Patient Details  Name: Todd [name] MRN: [id number] Date of Birth: [birth year] Referring Provider: [name], MD  Encounter Date: 04/05/2017      PT End of Session - 04/05/17 1051    Visit Number 24   Number of Visits 35   Date for PT Re-Evaluation 04/28/17   Authorization Type BCBS primary & Medicaid secondary   PT Start Time 1020   PT Stop Time 1049  left early for another appt with prosthetist   PT Time Calculation (min) 29 min   Equipment Utilized During Treatment Gait belt   Activity Tolerance Patient limited by pain   Behavior During Therapy [facility] for tasks assessed/performed      Past Medical History:  Diagnosis Date  . AKI (acute kidney injury) ([place])   . Constipated   . Diabetes mellitus without complication ([place])   . Diarrhea   . Elevated LFTs   . Goiter   . Gout   . Hyperlipidemia   . Hypertension   . Leukocytosis   . Reactive thrombocytosis   . Right BKA infection ([place]) 08/2016  . Right leg pain   . Sepsis due to undetermined organism ([place])   . Thyroid disease   . Wound infection after surgery 08/2016    Past Surgical History:  Procedure Laterality Date  . ABDOMINAL AORTOGRAM N/A 08/11/2016   Procedure: Abdominal Aortogram;  Surgeon: Waynetta Sandy, MD;  Location: [facility];  Service: Cardiovascular;  Laterality: N/A;  . ABDOMINAL AORTOGRAM W/LOWER EXTREMITY N/A 08/15/2016   Procedure: Abdominal Aortogram w/Lower Extremity;  Surgeon: Elam Dutch, MD;  Location: [facility] [facility];  Service: Cardiovascular;  Laterality: N/A;  . AMPUTATION Right 08/17/2016   Procedure: RIGHT BELOW KNEE AMPUTATION;  Surgeon: Elam Dutch, MD;  Location: [place];  Service: Vascular;  Laterality: Right;  . AMPUTATION Right 09/12/2016   Procedure: AMPUTATION ABOVE KNEE;   Surgeon: Newt Minion, MD;  Location: [facility];  Service: Orthopedics;  Laterality: Right;  . AMPUTATION Left 08/12/2016   Procedure: LEFT BELOW KNEE AMPUTATION;  Surgeon: Newt Minion, MD;  Location: [place];  Service: Orthopedics;  Laterality: Left;  . APPLICATION OF WOUND VAC Right 09/12/2016   Procedure: APPLICATION OF WOUND VAC ABOVE KNEE;  Surgeon: Newt Minion, MD;  Location: [facility] [place];  Service: Orthopedics;  Laterality: Right;  . LOWER EXTREMITY ANGIOGRAPHY Bilateral 08/11/2016   Procedure: Lower Extremity Angiography;  Surgeon: Waynetta Sandy, MD;  Location: [facility] [facility];  Service: Cardiovascular;  Laterality: Bilateral;  . PERIPHERAL VASCULAR BALLOON ANGIOPLASTY Left 08/11/2016   Procedure: Peripheral Vascular Balloon Angioplasty;  Surgeon: Waynetta Sandy, MD;  Location: [facility];  Service: Cardiovascular;  Laterality: Left;  SFA  . THYROID SURGERY      There were no vitals filed for this visit.      Subjective Assessment - 04/05/17 1023    Subjective Reports continued increase in left leg with increased prosthesis wear. Hurts more when he removes the prosthesis and at night. Has increased in pain med intake to 2x a day from 1x day.  No falls. Has not stood at home.    Patient is accompained by: Family member   Pertinent History R  TFA 09/12/2016, L TTA 08/12/2016, gout, HTN, DM, Neuropathy, renal failure   Limitations Standing;Walking;House hold activities  Patient Stated Goals Use prostheses to get back into community. Get out of w/c.   Currently in Pain? Yes   Pain Score 6    Pain Location Leg   Pain Orientation Left   Pain Descriptors / Indicators Aching;Sore;Pins and needles  aching/sore w/standing; pins and needles at night/after taking prosthesis off   Pain Type Chronic pain;Neuropathic pain   Pain Onset More than a month ago   Pain Frequency Intermittent   Aggravating Factors  weight bearing   Pain Relieving Factors sitting             [facility] Adult PT Treatment/Exercise - 04/05/17 1026      Transfers   Transfers Sit to Stand;Stand to Sit   Sit to Stand 4: Min assist;With upper extremity assist;From chair/3-in-1   Sit to Stand Details Verbal cues for sequencing;Verbal cues for technique;Verbal cues for safe use of DME/AE  cues for weight shifting to come into standing   Sit to Stand Details (indicate cue type and reason) pt able to achieve standing on 1st attempt wheelchair to RW with min assist, cues and increased time.    Stand to Sit 5: Supervision;With upper extremity assist;To chair/3-in-1;With armrests   Stand to Sit Details increased time needed with sitting to wheelchair. no cues needed with pt demo'ing safe technique with controlled descent.      Ambulation/Gait   Ambulation/Gait Yes   Ambulation/Gait Assistance 4: Min assist   Ambulation/Gait Assistance Details cues for upright posture, RW position with gait and step placement   Ambulation Distance (Feet) 8 Feet   Assistive device Rolling walker;Prostheses   Gait Pattern Step-through pattern;Decreased stride length;Decreased trunk rotation;Trunk flexed;Wide base of support;Right flexed knee in stance   Ambulation Surface Level;Indoor     Manual Therapy   Manual therapy comments educated on use of ice massage to left tibial crest area for decreased pain x 6 minutes.       Prosthetics   Current prosthetic wear tolerance (days/week)  daily   Current prosthetic wear tolerance (#hours/day)  TFA- wearing liner up to 8-10 hours a day, TTA- wearing liner every day, has worn prosthesis daily as well for ~ 5-6 hours total                          Residual limb condition  TTA- bruise with possible bone spur noted at tibial crest area. pt sensitive to touch in this area as well. see manual therapy for intervention to address pain. TFA: intact per pt report   Education Provided Proper wear schedule/adjustment;Proper weight-bearing schedule/adjustment;Residual limb care;Other  (comment)  see maual therapy comments   Person(s) Educated Patient;Parent(s);Caregiver(s)   Education Method Explanation;Demonstration;Verbal cues   Education Method Verbalized understanding;Verbal cues required;Needs further instruction   Donning Prosthesis Supervision   Doffing Prosthesis Modified independent (device/increased time)             PT Education - 04/05/17 1136    Education provided Yes   Education Details indications of ice massage and how to use this at home for pain management on limb for increased tolearance to prosthesis socket   Person(s) Educated Patient;Parent(s);Caregiver(s)   Methods Explanation;Demonstration;Verbal cues   Comprehension Verbalized understanding;Returned demonstration          PT Short Term Goals - 03/22/17 2133      PT SHORT TERM GOAL #1   Title Patient verbalizes proper cleaning of prostheses.    Baseline MET 03/20/17   Time 4  Period Weeks   Status Achieved     PT SHORT TERM GOAL #2   Title Patient tolerates wear of TTA prosthesis >8 hrs total /day & TFA prosthesis >2 hrs /day without skin issues.    Baseline Partially MET 03/22/17 Pt reports wearing TTA prosthesis 6-8 hrs  but not wearing TFA prosthesis, liner only   Time 4   Period Weeks   Status Partially Met     PT SHORT TERM GOAL #3   Title Sit to /from stand w/c to RW with supervision with prostheses.    Baseline 03/22/17 NOT MET ModA to RW & MinA to sink   Time 4   Period Weeks   Status Not Met     PT SHORT TERM GOAL #4   Title Patient reaches 5" anteriorly with RW support with prostheses with supervision.    Baseline MET 03/22/17   Time 4   Period Weeks   Status Achieved     PT SHORT TERM GOAL #5   Title Patient ambulates 55' with RW & prostheses with modA of 1 person only.    Baseline MET 03/22/17   Time 4   Period Weeks   Status Achieved           PT Long Term Goals - 02/23/17 1246      PT LONG TERM GOAL #1   Title Patient verbalizes and  demonstrates understanding of prosthetic care to enable safe use of prostheses.  (Target Date: 04/28/2017)   Time 4   Period Months   Status On-going   Target Date 04/28/17     PT LONG TERM GOAL #2   Title Patient tolerates wear of TTA prosthesis >90% and TFA >75% of awake hours to enable function throughout his day. (Target Date: 04/28/2017)   Time 4   Period Months   Status On-going   Target Date 04/28/17     PT LONG TERM GOAL #3   Title Patient performs transfers including sit to/from stand and stand pivot transfers with LRAD & prostheses modified independent.  (Target Date: 04/28/2017)   Time 4   Period Months   Status On-going   Target Date 04/28/17     PT LONG TERM GOAL #4   Title Patient performs standing balance with LRAD & prostheses reaching 10" anteriorly, reaching to floor, looking over shoulders and managing clothes modified independent.  (Target Date: 04/28/2017)   Time 4   Period Months   Status On-going   Target Date 04/28/17     PT LONG TERM GOAL #5   Title Patient ambulates 300' with LRAD & prostheses modified independent to enable community mobility.  (Target Date: 04/28/2017)   Time 4   Period Months   Status On-going   Target Date 04/28/17     PT LONG TERM GOAL #6   Title Patient negotiates ramps, curbs & stairs with LRAD & prostheses modified independent to enable community access.  (Target Date: 04/28/2017)   Time 4   Period Months   Status On-going   Target Date 04/28/17     PT LONG TERM GOAL #7   Title Activities of Balance Confidence score >15%.  (Target Date: 04/28/2017)   Baseline Initial ABC 0.0%   Time 4   Period Months   Status On-going   Target Date 04/28/17             Plan - 04/05/17 1052    Clinical Impression Statement Today's skilled session addressed pain on left residual limb with  education on ice massage with minimal decrease reported. Pt did demo less sensitivity to touch at that area ice massage continued. Pt was able  to stand with one person assist and attempt gait. Pt reported increased pain to 8-9/10 after ~8 feet and requested to sit down. Call had been placed to [facility] at [facility] prior to standing/gait with him saying to send pt over when he is done in PT for prosthetic adjustment. Session ended early due to decreased activity tolerance due to increased pain with pt heading over to [place] for potential adjustment to BKA socket. Pt is making slow progress and should benefit from continued PT to progress toward unmet goals.     Rehab Potential Good   PT Frequency 2x / week   PT Duration Other (comment)  4 months   PT Treatment/Interventions ADLs/Self Care Home Management;DME Instruction;Gait training;Stair training;Functional mobility training;Therapeutic activities;Therapeutic exercise;Balance training;Neuromuscular re-education;Patient/family education;Prosthetic Training;Manual techniques   PT Next Visit Plan continue towards LTGs   Consulted and Agree with Plan of Care Patient      Patient will benefit from skilled therapeutic intervention in order to improve the following deficits and impairments:  Abnormal gait, Decreased activity tolerance, Decreased balance, Decreased endurance, Decreased knowledge of use of DME, Decreased mobility, Decreased range of motion, Decreased skin integrity, Decreased strength, Postural dysfunction, Prosthetic Dependency, Pain  Visit Diagnosis: Unsteadiness on feet  Other abnormalities of gait and mobility  Muscle weakness (generalized)     Problem List Patient Active Problem List   Diagnosis Date Noted  . History of right above knee amputation ([place]) 09/21/2016  . Acquired absence of left leg below knee ([place]) 09/21/2016  . Transaminitis   . Diabetic peripheral neuropathy ([place]) 07/21/2016  . Renal failure 07/04/2016  . Hyperuricemia 06/21/2016  . HTN (hypertension) 01/05/2016  . Diabetes type 2, uncontrolled ([place]) 01/05/2016  . Hypothyroidism 05/10/2011     Willow Ora, PTA, [facility] [address] [place], [place] [address] [phone] 04/05/17, 11:37 AM   Name: Rondo Spittler MRN: [id number] Date of Birth: 1955/08/26

## 2017-04-07 ENCOUNTER — Ambulatory Visit (INDEPENDENT_AMBULATORY_CARE_PROVIDER_SITE_OTHER): Payer: BLUE CROSS/BLUE SHIELD | Admitting: Family Medicine

## 2017-04-07 ENCOUNTER — Encounter: Payer: Self-pay | Admitting: Family Medicine

## 2017-04-07 VITALS — BP 121/81 | HR 71 | Temp 97.2°F | Ht 68.0 in

## 2017-04-07 DIAGNOSIS — Z23 Encounter for immunization: Secondary | ICD-10-CM

## 2017-04-07 DIAGNOSIS — L89892 Pressure ulcer of other site, stage 2: Secondary | ICD-10-CM | POA: Diagnosis not present

## 2017-04-07 MED ORDER — TRAMADOL HCL 50 MG PO TABS: 50.0000 mg | ORAL_TABLET | Freq: Four times a day (QID) | ORAL | 1 refills | Status: DC | PRN

## 2017-04-07 NOTE — Progress Notes (Signed)
Subjective:  Patient ID: Todd [name], male    DOB: 06-24-1955  Age: 61 y.o. MRN: [id number]  CC: Leg Pain (pt here today c/o left stump pain and wanting pain medicine)   HPI Todd [name] presents for Pain at left tibial tuberosity. His left leg was amputated below the knee several months ago. He lost the right as well. The prosthesis on the left has been rubbing against the tibial tuberosity and causing intense pain when he walks on it. It is gotten irritated. He needs something for pain while they are working on padding the prosthesis properly.  Depression screen [facility] 2/9 04/07/2017 02/14/2017 08/05/2016  Decreased Interest 0 0 0  Down, Depressed, Hopeless 0 0 0  PHQ - 2 Score 0 0 0    History Todd [name] has a past medical history of AKI (acute kidney injury) ([place]); Constipated; Diabetes mellitus without complication ([place]); Diarrhea; Elevated LFTs; Goiter; Gout; Hyperlipidemia; Hypertension; Leukocytosis; Reactive thrombocytosis; Right BKA infection ([place]) (08/2016); Right leg pain; Sepsis due to undetermined organism ([place]); Thyroid disease; and Wound infection after surgery (08/2016).   He has a past surgical history that includes Thyroid surgery; Lower Extremity Angiography (Bilateral, 08/11/2016); ABDOMINAL AORTOGRAM (N/A, 08/11/2016); PERIPHERAL VASCULAR BALLOON ANGIOPLASTY (Left, 08/11/2016); ABDOMINAL AORTOGRAM W/LOWER EXTREMITY (N/A, 08/15/2016); Amputation (Right, 08/17/2016); Amputation (Right, 09/12/2016); Application if wound vac (Right, 09/12/2016); and Amputation (Left, 08/12/2016).   His family history includes Diabetes in his maternal aunt and maternal uncle; Heart disease in his mother; Pneumonia in his father.He reports that he has been smoking Cigarettes.  He has a 33.75 pack-year smoking history. He has never used smokeless tobacco. He reports that he does not drink alcohol or use drugs.    ROS Review of Systems noncontributory except as per history of present illness  Objective:  BP  121/81   Pulse 71   Temp (!) 97.2 F (36.2 C) (Oral)   Ht 5\' 8"  (1.727 m)   BP Readings from Last 3 Encounters:  04/07/17 121/81  02/14/17 111/66  11/11/16 111/61    Wt Readings from Last 3 Encounters:  11/28/16 128 lb (58.1 kg)  10/31/16 128 lb (58.1 kg)  10/19/16 128 lb (58.1 kg)     Physical Exam  Constitutional: He is oriented to person, place, and time. He appears well-developed and well-nourished.  HENT:  Head: Normocephalic and atraumatic.  Right Ear: External ear normal.  Left Ear: External ear normal.  Mouth/Throat: No oropharyngeal exudate or posterior oropharyngeal erythema.  Eyes: Pupils are equal, round, and reactive to light.  Neck: Normal range of motion. Neck supple.  Cardiovascular: Normal rate and regular rhythm.   No murmur heard. Pulmonary/Chest: Breath sounds normal. No respiratory distress.  Musculoskeletal: He exhibits tenderness (there is marked tenderness at the left tibial tuberosity. This is surrounded by mild erythema without edema or fluctuance.) and deformity (right AKA, left BKA).  Neurological: He is alert and oriented to person, place, and time.  Vitals reviewed.     Assessment & Plan:   Jernard was seen today for leg pain.  Diagnoses and all orders for this visit:  Pressure injury of left knee, stage 2  Need for immunization against influenza -     Flu Vaccine QUAD 36+ mos IM  Other orders -     traMADol (ULTRAM) 50 MG tablet; Take 1 tablet (50 mg total) by mouth every 6 (six) hours as needed for moderate pain.    Patient assures me that prosthetics is working on better padding I highly encouraged  him to work on that and to avoid overuse of the pain meds since they are habit-forming. Limited prescription for 2 weeks only given I am having Mr. Willison start on traMADol. I am also having him maintain his Insulin Glargine, loperamide, allopurinol, levothyroxine, meloxicam, atorvastatin, carvedilol, and COLCRYS.  Allergies as of  04/07/2017      Reactions   Lisinopril Other (See Comments)   Hyperkalemia / Renal failure      Medication List       Accurate as of 04/07/17 11:40 AM. Always use your most recent med list.          allopurinol 100 MG tablet Commonly known as:  ZYLOPRIM Take 1 tablet (100 mg total) by mouth daily.   atorvastatin 40 MG tablet Commonly known as:  LIPITOR Take 1 tablet (40 mg total) by mouth daily at 6 PM.   carvedilol 3.125 MG tablet Commonly known as:  COREG Take 1 tablet (3.125 mg total) by mouth 2 (two) times daily with a meal.   COLCRYS 0.6 MG tablet Generic drug:  colchicine Take 1 tablet (0.6 mg total) by mouth 2 (two) times daily as needed.   Insulin Glargine 100 UNIT/ML Solostar Pen Commonly known as:  LANTUS SOLOSTAR Inject 10 Units into the skin daily.   levothyroxine 100 MCG tablet Commonly known as:  SYNTHROID, LEVOTHROID TAKE 1 TABLET BY MOUTH ONCE DAILY BEFORE BREAKFAST   loperamide 2 MG capsule Commonly known as:  IMODIUM Take 4 mg by mouth as needed for diarrhea or loose stools.   meloxicam 15 MG tablet Commonly known as:  MOBIC Take 1 tablet (15 mg total) by mouth daily.   traMADol 50 MG tablet Commonly known as:  ULTRAM Take 1 tablet (50 mg total) by mouth every 6 (six) hours as needed for moderate pain.        Follow-up: As previously arranged for December 4  [name], M.D.

## 2017-04-10 ENCOUNTER — Encounter: Payer: Self-pay | Admitting: Physical Therapy

## 2017-04-10 ENCOUNTER — Ambulatory Visit: Payer: BLUE CROSS/BLUE SHIELD | Admitting: Physical Therapy

## 2017-04-10 DIAGNOSIS — R2681 Unsteadiness on feet: Secondary | ICD-10-CM

## 2017-04-10 DIAGNOSIS — R2689 Other abnormalities of gait and mobility: Secondary | ICD-10-CM

## 2017-04-10 DIAGNOSIS — M6281 Muscle weakness (generalized): Secondary | ICD-10-CM

## 2017-04-10 NOTE — Therapy (Signed)
[facility] [address] [place], [place], [address] Phone: [phone]   Fax:  [phone]  Physical Therapy Treatment  Patient Details  Name: Todd [name] MRN: [id number] Date of Birth: 26-Nov-1955 Referring Provider: [name], MD  Encounter Date: 04/10/2017      PT End of Session - 04/10/17 1106    Visit Number 25   Number of Visits 35   Date for PT Re-Evaluation 04/28/17   Authorization Type BCBS primary & Medicaid secondary   PT Start Time 1101   PT Stop Time 1141   PT Time Calculation (min) 40 min   Equipment Utilized During Treatment Gait belt   Activity Tolerance Patient limited by pain   Behavior During Therapy [facility] for tasks assessed/performed      Past Medical History:  Diagnosis Date  . AKI (acute kidney injury) ([place])   . Constipated   . Diabetes mellitus without complication ([place])   . Diarrhea   . Elevated LFTs   . Goiter   . Gout   . Hyperlipidemia   . Hypertension   . Leukocytosis   . Reactive thrombocytosis   . Right BKA infection ([place]) 08/2016  . Right leg pain   . Sepsis due to undetermined organism ([place])   . Thyroid disease   . Wound infection after surgery 08/2016    Past Surgical History:  Procedure Laterality Date  . ABDOMINAL AORTOGRAM N/A 08/11/2016   Procedure: Abdominal Aortogram;  Surgeon: Waynetta Sandy, MD;  Location: [place] [facility];  Service: Cardiovascular;  Laterality: N/A;  . ABDOMINAL AORTOGRAM W/LOWER EXTREMITY N/A 08/15/2016   Procedure: Abdominal Aortogram w/Lower Extremity;  Surgeon: Elam Dutch, MD;  Location: [place] [facility];  Service: Cardiovascular;  Laterality: N/A;  . AMPUTATION Right 08/17/2016   Procedure: RIGHT BELOW KNEE AMPUTATION;  Surgeon: Elam Dutch, MD;  Location: [place];  Service: Vascular;  Laterality: Right;  . AMPUTATION Right 09/12/2016   Procedure: AMPUTATION ABOVE KNEE;  Surgeon: Newt Minion, MD;  Location: [place];   Service: Orthopedics;  Laterality: Right;  . AMPUTATION Left 08/12/2016   Procedure: LEFT BELOW KNEE AMPUTATION;  Surgeon: Newt Minion, MD;  Location: [place];  Service: Orthopedics;  Laterality: Left;  . APPLICATION OF WOUND VAC Right 09/12/2016   Procedure: APPLICATION OF WOUND VAC ABOVE KNEE;  Surgeon: Newt Minion, MD;  Location: [facility];  Service: Orthopedics;  Laterality: Right;  . LOWER EXTREMITY ANGIOGRAPHY Bilateral 08/11/2016   Procedure: Lower Extremity Angiography;  Surgeon: Waynetta Sandy, MD;  Location: [place] [facility];  Service: Cardiovascular;  Laterality: Bilateral;  . PERIPHERAL VASCULAR BALLOON ANGIOPLASTY Left 08/11/2016   Procedure: Peripheral Vascular Balloon Angioplasty;  Surgeon: Waynetta Sandy, MD;  Location: [place] [facility];  Service: Cardiovascular;  Laterality: Left;  SFA  . THYROID SURGERY      There were no vitals filed for this visit.      Subjective Assessment - 04/10/17 1102    Subjective Continues to have pain at left tibial crest area. Saw Md who checked limb and prescribed Tramadol (see medication list for details). Pt does not feel the Tramadol has helped, however just started on Friday. Saw [employer] who adjusted pads and ground out area that goes over tibial crest area. Reports wearing BKA prosthesis when sitting at home, has not "put pressure" through it.    Patient is accompained by: Family member   Pertinent History R  TFA 09/12/2016, L TTA  08/12/2016, gout, HTN, DM, Neuropathy, renal failure   Limitations Standing;Walking;House hold activities   Patient Stated Goals Use prostheses to get back into community. Get out of w/c.   Currently in Pain? Yes   Pain Score 8    Pain Location Leg   Pain Orientation Left   Pain Descriptors / Indicators Aching;Sore;Throbbing;Tender;Pins and needles   Pain Type Chronic pain;Neuropathic pain   Pain Onset More than a month ago   Pain Frequency Constant   Aggravating Factors  prosthesis wear, weight  bearing   Pain Relieving Factors taking prosthesis off, rest, ? if medication is helping               [facility] Adult PT Treatment/Exercise - 04/10/17 1107      Transfers   Transfers Sit to Stand;Stand to Sit   Sit to Stand 3: Mod assist;4: Min assist;With upper extremity assist;From chair/3-in-1   Sit to Stand Details Verbal cues for sequencing;Verbal cues for technique;Verbal cues for safe use of DME/AE  cues for weight shifting to come into full standing   Stand to Sit 5: Supervision;With upper extremity assist;To chair/3-in-1;With armrests   Stand to Sit Details (indicate cue type and reason) Verbal cues for sequencing;Verbal cues for technique;Verbal cues for safe use of DME/AE   Stand to Sit Details conitinues to need increased time for controlled descent with sitting down. pt able to unlock AKA prosthesis without cues/assitance needed     Ambulation/Gait   Ambulation/Gait Yes   Ambulation/Gait Assistance 4: Min assist   Ambulation/Gait Assistance Details cues on posture, sequencing and base of support with gait. manual blocking of AKA prosthesis for safety, pt self advances and locks prosthetic knee.    Ambulation Distance (Feet) 25 Feet  x1, 8 x1   Assistive device Rolling walker;Prostheses   Gait Pattern Step-through pattern;Decreased stride length;Decreased trunk rotation;Trunk flexed;Wide base of support;Right flexed knee in stance   Ambulation Surface Level;Indoor   Curb 3: Mod assist   Curb Details (indicate cue type and reason) using 4 inch box with RW/prostheses x2 reps, mod assist with reminder cues on sequencing and assist for balance     Prosthetics   Prosthetic Care Comments  ice massage to left tibial crest for reduced pain prior to standing/gait/activity x 5-6 minutes.    Current prosthetic wear tolerance (days/week)  daily   Current prosthetic wear tolerance (#hours/day)  TFA- wearing liner up to 8-10 hours a day, TTA- wearing liner every day, has worn  prosthesis daily as well for ~ 5-6 hours total                          Residual limb condition  TTA- bruise  noted at tibial crest area. pt sensitive to touch in this area as well. see manual therapy for intervention to address pain. TFA: intact per pt report   Education Provided Proper wear schedule/adjustment;Proper weight-bearing schedule/adjustment;Residual limb care   Person(s) Educated Patient;Other (comment)  pt's uncle   Education Method Explanation;Demonstration;Verbal cues   Education Method Verbalized understanding;Verbal cues required;Returned demonstration   Donning Prosthesis Supervision;Minimal assist   Doffing Prosthesis Modified independent (device/increased time)             PT Short Term Goals - 03/22/17 2133      PT SHORT TERM GOAL #1   Title Patient verbalizes proper cleaning of prostheses.    Baseline MET 03/20/17   Time 4   Period Weeks   Status Achieved  PT SHORT TERM GOAL #2   Title Patient tolerates wear of TTA prosthesis >8 hrs total /day & TFA prosthesis >2 hrs /day without skin issues.    Baseline Partially MET 03/22/17 Pt reports wearing TTA prosthesis 6-8 hrs  but not wearing TFA prosthesis, liner only   Time 4   Period Weeks   Status Partially Met     PT SHORT TERM GOAL #3   Title Sit to /from stand w/c to RW with supervision with prostheses.    Baseline 03/22/17 NOT MET ModA to RW & MinA to sink   Time 4   Period Weeks   Status Not Met     PT SHORT TERM GOAL #4   Title Patient reaches 5" anteriorly with RW support with prostheses with supervision.    Baseline MET 03/22/17   Time 4   Period Weeks   Status Achieved     PT SHORT TERM GOAL #5   Title Patient ambulates 74' with RW & prostheses with modA of 1 person only.    Baseline MET 03/22/17   Time 4   Period Weeks   Status Achieved           PT Long Term Goals - 02/23/17 1246      PT LONG TERM GOAL #1   Title Patient verbalizes and demonstrates understanding of  prosthetic care to enable safe use of prostheses.  (Target Date: 04/28/2017)   Time 4   Period Months   Status On-going   Target Date 04/28/17     PT LONG TERM GOAL #2   Title Patient tolerates wear of TTA prosthesis >90% and TFA >75% of awake hours to enable function throughout his day. (Target Date: 04/28/2017)   Time 4   Period Months   Status On-going   Target Date 04/28/17     PT LONG TERM GOAL #3   Title Patient performs transfers including sit to/from stand and stand pivot transfers with LRAD & prostheses modified independent.  (Target Date: 04/28/2017)   Time 4   Period Months   Status On-going   Target Date 04/28/17     PT LONG TERM GOAL #4   Title Patient performs standing balance with LRAD & prostheses reaching 10" anteriorly, reaching to floor, looking over shoulders and managing clothes modified independent.  (Target Date: 04/28/2017)   Time 4   Period Months   Status On-going   Target Date 04/28/17     PT LONG TERM GOAL #5   Title Patient ambulates 300' with LRAD & prostheses modified independent to enable community mobility.  (Target Date: 04/28/2017)   Time 4   Period Months   Status On-going   Target Date 04/28/17     PT LONG TERM GOAL #6   Title Patient negotiates ramps, curbs & stairs with LRAD & prostheses modified independent to enable community access.  (Target Date: 04/28/2017)   Time 4   Period Months   Status On-going   Target Date 04/28/17     PT LONG TERM GOAL #7   Title Activities of Balance Confidence score >15%.  (Target Date: 04/28/2017)   Baseline Initial ABC 0.0%   Time 4   Period Months   Status On-going   Target Date 04/28/17            Plan - 04/10/17 1106    Clinical Impression Statement Today's skilled session continued to address pain relief of left limb prior to activity with prostheses. Pt was able to to  increase activity today vs with previous session with no increase in pain per his subjective reports. Pt is slowly  progressing toward goals and should benefit from continued PT to progress toward unmet goals.    Rehab Potential Good   PT Frequency 2x / week   PT Duration Other (comment)  4 months   PT Treatment/Interventions ADLs/Self Care Home Management;DME Instruction;Gait training;Stair training;Functional mobility training;Therapeutic activities;Therapeutic exercise;Balance training;Neuromuscular re-education;Patient/family education;Prosthetic Training;Manual techniques   PT Next Visit Plan continue towards LTGs   Consulted and Agree with Plan of Care Patient      Patient will benefit from skilled therapeutic intervention in order to improve the following deficits and impairments:  Abnormal gait, Decreased activity tolerance, Decreased balance, Decreased endurance, Decreased knowledge of use of DME, Decreased mobility, Decreased range of motion, Decreased skin integrity, Decreased strength, Postural dysfunction, Prosthetic Dependency, Pain  Visit Diagnosis: Unsteadiness on feet  Other abnormalities of gait and mobility  Muscle weakness (generalized)     Problem List Patient Active Problem List   Diagnosis Date Noted  . History of right above knee amputation ([place]) 09/21/2016  . Acquired absence of left leg below knee ([place]) 09/21/2016  . Transaminitis   . Diabetic peripheral neuropathy ([place]) 07/21/2016  . Renal failure 07/04/2016  . Hyperuricemia 06/21/2016  . HTN (hypertension) 01/05/2016  . Diabetes type 2, uncontrolled ([place]) 01/05/2016  . Hypothyroidism 05/10/2011    Willow Ora, PTA, [facility] [address] [place], [place] [address] [phone] 04/10/17, 12:13 PM   Name: Martha Soltys MRN: [id number] Date of Birth: 07/22/1955

## 2017-04-11 ENCOUNTER — Ambulatory Visit (INDEPENDENT_AMBULATORY_CARE_PROVIDER_SITE_OTHER): Payer: BLUE CROSS/BLUE SHIELD | Admitting: Cardiovascular Disease

## 2017-04-11 ENCOUNTER — Encounter: Payer: Self-pay | Admitting: Cardiovascular Disease

## 2017-04-11 VITALS — BP 120/100 | HR 62 | Wt 160.0 lb

## 2017-04-11 DIAGNOSIS — E782 Mixed hyperlipidemia: Secondary | ICD-10-CM

## 2017-04-11 DIAGNOSIS — I429 Cardiomyopathy, unspecified: Secondary | ICD-10-CM | POA: Diagnosis not present

## 2017-04-11 DIAGNOSIS — I10 Essential (primary) hypertension: Secondary | ICD-10-CM | POA: Diagnosis not present

## 2017-04-11 DIAGNOSIS — I25708 Atherosclerosis of coronary artery bypass graft(s), unspecified, with other forms of angina pectoris: Secondary | ICD-10-CM

## 2017-04-11 NOTE — Progress Notes (Signed)
SUBJECTIVE: The patient presents for follow-up of presumed coronary artery disease with cardiomyopathy, LVEF 45-50%.  He underwent right BKA and left AKA earlier this year.  He attends therapy twice per week.  He denies chest pain and shortness of breath.  He has cut back on his smoking.  He used to smoke 1/2 pack of cigarettes daily and now a pack will last him 4 days.     Review of Systems: As per "subjective", otherwise negative.  Allergies  Allergen Reactions  . Lisinopril Other (See Comments)    Hyperkalemia / Renal failure    Current Outpatient Prescriptions  Medication Sig Dispense Refill  . allopurinol (ZYLOPRIM) 100 MG tablet Take 1 tablet (100 mg total) by mouth daily. 30 tablet 6  . atorvastatin (LIPITOR) 40 MG tablet Take 1 tablet (40 mg total) by mouth daily at 6 PM.    . carvedilol (COREG) 3.125 MG tablet Take 1 tablet (3.125 mg total) by mouth 2 (two) times daily with a meal. 60 tablet 0  . COLCRYS 0.6 MG tablet Take 1 tablet (0.6 mg total) by mouth 2 (two) times daily as needed. 60 tablet 1  . Insulin Glargine (LANTUS SOLOSTAR) 100 UNIT/ML Solostar Pen Inject 10 Units into the skin daily. 15 mL 11  . levothyroxine (SYNTHROID, LEVOTHROID) 100 MCG tablet TAKE 1 TABLET BY MOUTH ONCE DAILY BEFORE BREAKFAST 30 tablet 9  . loperamide (IMODIUM) 2 MG capsule Take 4 mg by mouth as needed for diarrhea or loose stools.    . meloxicam (MOBIC) 15 MG tablet Take 1 tablet (15 mg total) by mouth daily. 30 tablet 5  . traMADol (ULTRAM) 50 MG tablet Take 1 tablet (50 mg total) by mouth every 6 (six) hours as needed for moderate pain. 60 tablet 1   No current facility-administered medications for this visit.     Past Medical History:  Diagnosis Date  . AKI (acute kidney injury) ([place])   . Constipated   . Diabetes mellitus without complication ([place])   . Diarrhea   . Elevated LFTs   . Goiter   . Gout   . Hyperlipidemia   . Hypertension   . Leukocytosis   . Reactive  thrombocytosis   . Right BKA infection ([place]) 08/2016  . Right leg pain   . Sepsis due to undetermined organism ([place])   . Thyroid disease   . Wound infection after surgery 08/2016    Past Surgical History:  Procedure Laterality Date  . ABDOMINAL AORTOGRAM N/A 08/11/2016   Procedure: Abdominal Aortogram;  Surgeon: Waynetta Sandy, MD;  Location: [facility];  Service: Cardiovascular;  Laterality: N/A;  . ABDOMINAL AORTOGRAM W/LOWER EXTREMITY N/A 08/15/2016   Procedure: Abdominal Aortogram w/Lower Extremity;  Surgeon: Elam Dutch, MD;  Location: [facility];  Service: Cardiovascular;  Laterality: N/A;  . AMPUTATION Right 08/17/2016   Procedure: RIGHT BELOW KNEE AMPUTATION;  Surgeon: Elam Dutch, MD;  Location: [facility];  Service: Vascular;  Laterality: Right;  . AMPUTATION Right 09/12/2016   Procedure: AMPUTATION ABOVE KNEE;  Surgeon: Newt Minion, MD;  Location: [facility];  Service: Orthopedics;  Laterality: Right;  . AMPUTATION Left 08/12/2016   Procedure: LEFT BELOW KNEE AMPUTATION;  Surgeon: Newt Minion, MD;  Location: [facility];  Service: Orthopedics;  Laterality: Left;  . APPLICATION OF WOUND VAC Right 09/12/2016   Procedure: APPLICATION OF WOUND VAC ABOVE KNEE;  Surgeon: Newt Minion, MD;  Location: [facility];  Service: Orthopedics;  Laterality: Right;  . LOWER EXTREMITY ANGIOGRAPHY Bilateral 08/11/2016   Procedure: Lower Extremity Angiography;  Surgeon: Waynetta Sandy, MD;  Location: [facility];  Service: Cardiovascular;  Laterality: Bilateral;  . PERIPHERAL VASCULAR BALLOON ANGIOPLASTY Left 08/11/2016   Procedure: Peripheral Vascular Balloon Angioplasty;  Surgeon: Waynetta Sandy, MD;  Location: [facility];  Service: Cardiovascular;  Laterality: Left;  SFA  . THYROID SURGERY      Social History   Social History  . Marital status: Single    Spouse name: N/A  . Number of children: N/A  . Years of education: N/A   Occupational History    . drives a forklift    Social History Main Topics  . Smoking status: Current Every Day Smoker    Packs/day: 0.75    Years: 45.00    Types: Cigarettes    Last attempt to quit: 11/12/2014  . Smokeless tobacco: Never Used  . Alcohol use No     Comment: h/o heavy use; stopped drinking in 7/16  . Drug use: No  . Sexual activity: Not Currently   Other Topics Concern  . Not on file   Social History Narrative  . No narrative on file     Vitals:   04/11/17 0940  BP: (!) 120/100  Pulse: 62  SpO2: 98%  Weight: 160 lb (72.6 kg)    Wt Readings from Last 3 Encounters:  04/11/17 160 lb (72.6 kg)  11/28/16 128 lb (58.1 kg)  10/31/16 128 lb (58.1 kg)     PHYSICAL EXAM General: NAD HEENT: Normal. Neck: No JVD, no thyromegaly. Lungs: Clear to auscultation bilaterally with normal respiratory effort. CV: Regular rate and rhythm, normal S1/S2, no S3/S4, no murmur. No pretibial or periankle edema.  No carotid bruit.   Abdomen: Soft, nontender, no distention.  Neurologic: Alert and oriented.  Psych: Normal affect. Skin: Normal. Musculoskeletal: Left AKA, right BKA.    ECG: Most recent ECG reviewed.   Labs: Lab Results  Component Value Date/Time   K 4.9 02/15/2017 04:06 PM   BUN 24 02/15/2017 04:06 PM   CREATININE 1.47 (H) 02/15/2017 04:06 PM   ALT 28 02/14/2017 11:41 AM   TSH 0.202 (L) 02/14/2017 11:41 AM   HGB 17.0 02/14/2017 11:41 AM     Lipids: Lab Results  Component Value Date/Time   LDLCALC 47 11/11/2016 10:49 AM   CHOL 109 11/11/2016 10:49 AM   TRIG 155 (H) 11/11/2016 10:49 AM   HDL 31 (L) 11/11/2016 10:49 AM       ASSESSMENT AND PLAN: 1. Presumed CAD with cardiomyopathy: Symptomatically stable. LVEF 45-50%. Continue ASA, Coreg, and statin. Given his chronic kidney disease stage III, coronary angiography would be unfavorable. I do not feel it is indicated at this time. I will not pursue an ischemic evaluation (stress test) given symptom stability.  2.  Cardiomyopathy: LVEF 45-50%. No evidence of heart failure. No diuretic requirement at this time. Continue carvedilol.  3. Hypertension: Elevated diastolic blood pressure.  I will monitor.  4. Hyperlipidemia: Continue Lipitor 40 mg.       Disposition: Follow up 1 year.   Kate Sable, M.D., F.A.C.C.

## 2017-04-11 NOTE — Patient Instructions (Signed)

## 2017-04-12 ENCOUNTER — Encounter: Payer: Self-pay | Admitting: Physical Therapy

## 2017-04-12 ENCOUNTER — Ambulatory Visit: Payer: BLUE CROSS/BLUE SHIELD | Admitting: Physical Therapy

## 2017-04-12 DIAGNOSIS — R2681 Unsteadiness on feet: Secondary | ICD-10-CM

## 2017-04-12 DIAGNOSIS — M6281 Muscle weakness (generalized): Secondary | ICD-10-CM

## 2017-04-12 DIAGNOSIS — R2689 Other abnormalities of gait and mobility: Secondary | ICD-10-CM

## 2017-04-12 NOTE — Therapy (Signed)
[facility] [address] [place], [place], [address] Phone: [phone]   Fax:  [phone]  Physical Therapy Treatment  Patient Details  Name: Todd [name] MRN: [id number] Date of Birth: 1956/05/05 Referring Provider: [name], MD  Encounter Date: 04/12/2017      PT End of Session - 04/12/17 1155    Visit Number 26   Number of Visits 35   Date for PT Re-Evaluation 04/28/17   Authorization Type BCBS primary & Medicaid secondary   PT Start Time 1015   PT Stop Time 1100   PT Time Calculation (min) 45 min   Equipment Utilized During Treatment Gait belt   Activity Tolerance Patient limited by pain   Behavior During Therapy [facility] for tasks assessed/performed      Past Medical History:  Diagnosis Date  . AKI (acute kidney injury) ([place])   . Constipated   . Diabetes mellitus without complication ([place])   . Diarrhea   . Elevated LFTs   . Goiter   . Gout   . Hyperlipidemia   . Hypertension   . Leukocytosis   . Reactive thrombocytosis   . Right BKA infection ([place]) 08/2016  . Right leg pain   . Sepsis due to undetermined organism ([place])   . Thyroid disease   . Wound infection after surgery 08/2016    Past Surgical History:  Procedure Laterality Date  . ABDOMINAL AORTOGRAM N/A 08/11/2016   Procedure: Abdominal Aortogram;  Surgeon: Waynetta Sandy, MD;  Location: [place];  Service: Cardiovascular;  Laterality: N/A;  . ABDOMINAL AORTOGRAM W/LOWER EXTREMITY N/A 08/15/2016   Procedure: Abdominal Aortogram w/Lower Extremity;  Surgeon: Elam Dutch, MD;  Location: [place] [facility];  Service: Cardiovascular;  Laterality: N/A;  . AMPUTATION Right 08/17/2016   Procedure: RIGHT BELOW KNEE AMPUTATION;  Surgeon: Elam Dutch, MD;  Location: [facility];  Service: Vascular;  Laterality: Right;  . AMPUTATION Right 09/12/2016   Procedure: AMPUTATION ABOVE KNEE;  Surgeon: Newt Minion, MD;  Location: [facility];   Service: Orthopedics;  Laterality: Right;  . AMPUTATION Left 08/12/2016   Procedure: LEFT BELOW KNEE AMPUTATION;  Surgeon: Newt Minion, MD;  Location: [facility];  Service: Orthopedics;  Laterality: Left;  . APPLICATION OF WOUND VAC Right 09/12/2016   Procedure: APPLICATION OF WOUND VAC ABOVE KNEE;  Surgeon: Newt Minion, MD;  Location: [facility];  Service: Orthopedics;  Laterality: Right;  . LOWER EXTREMITY ANGIOGRAPHY Bilateral 08/11/2016   Procedure: Lower Extremity Angiography;  Surgeon: Waynetta Sandy, MD;  Location: [place] [facility];  Service: Cardiovascular;  Laterality: Bilateral;  . PERIPHERAL VASCULAR BALLOON ANGIOPLASTY Left 08/11/2016   Procedure: Peripheral Vascular Balloon Angioplasty;  Surgeon: Waynetta Sandy, MD;  Location: [facility] [place] [facility];  Service: Cardiovascular;  Laterality: Left;  SFA  . THYROID SURGERY      There were no vitals filed for this visit.      Subjective Assessment - 04/12/17 1020    Subjective Pt continues to have pain near bottom of left leg, very tender and pt reports that he is implemeting ice massage at home with no improvement. No falls to report.    Patient is accompained by: Family member   Pertinent History R  TFA 09/12/2016, L TTA 08/12/2016, gout, HTN, DM, Neuropathy, renal failure   Limitations Standing;Walking;House hold activities   Patient Stated Goals Use prostheses to get back into community. Get out of w/c.   Currently in Pain?  Yes   Pain Score 8    Pain Location Leg   Pain Orientation Left   Pain Descriptors / Indicators Aching;Sore;Throbbing;Pins and needles   Pain Type Chronic pain;Neuropathic pain             [facility] Adult PT Treatment/Exercise - 04/12/17 0001      Transfers   Transfers Sit to Stand;Stand to Sit   Sit to Stand 3: Mod assist;4: Min assist   Sit to Stand Details Verbal cues for technique;Verbal cues for sequencing   Sit to Stand Details (indicate cue type and reason) Pt transfering from W/C to RW  then back to W/C, Mod A for standing/Min A for sitting. Pt required inital cue for proper foot placement prior to standing.  Therapist blocking R knee from buckling when standing. Pt required increased time to stand, less time required to sit with good technique and minimal assistance.      Ambulation/Gait   Ambulation/Gait Yes   Ambulation/Gait Assistance 3: Mod assist   Ambulation/Gait Assistance Details Cues for posture and to weightshift to LLE while advancing RLE. Mod A during gait with therapist blocking R knee to prevent any buckling with ambulation.    Ambulation Distance (Feet) 10 Feet   Assistive device Rolling walker   Gait Pattern Step-through pattern;Decreased stance time - right;Decreased stride length;Narrow base of support;Trunk flexed   Ambulation Surface Level;Indoor     Neuro Re-ed    Neuro Re-ed Details  Pt standing inside RW performing lateral and anterior/posterior weightshift. Pt rocking back and forth/side to side to off weight right then left LE and stepping fwd and back with each LE to practice balance while requiring BUE support on RW. Pt was min assist with this task for cues on technique, while therapist blocked the R knee from buckling during activity.              Balance Exercises - 04/12/17 1145      Balance Exercises: Seated   Other Seated Exercises Pt seated on short stool inside of parallel bars to offset LLE due to complaints of pain. Pt performing seated mini crunches with cues to focus on bending at the hips x10reps/2 sets, trunk rotation with arms laterally abducted with cues to rotate as far as pt was able x10reps/2 sets, lateral bending while reaching towards the floor with cues to reach further down to increase bend x10reps/2 sets, and alt. arm raises with cues to bring shoulders back and improve posture to increase flexion x10. Pt tolerated this well and reported less pain in LLE.              PT Short Term Goals - 04/12/17 1155      PT  SHORT TERM GOAL #1   Title Patient verbalizes proper cleaning of prostheses.    Baseline MET 03/20/17   Time 4   Period Weeks   Status Achieved     PT SHORT TERM GOAL #2   Title Patient tolerates wear of TTA prosthesis >8 hrs total /day & TFA prosthesis >2 hrs /day without skin issues.    Baseline Partially MET 03/22/17 Pt reports wearing TTA prosthesis 6-8 hrs  but not wearing TFA prosthesis, liner only   Time 4   Period Weeks   Status Partially Met     PT SHORT TERM GOAL #3   Title Sit to /from stand w/c to RW with supervision with prostheses.    Baseline 03/22/17 NOT MET ModA to RW & MinA to sink  Time 4   Period Weeks   Status Not Met     PT SHORT TERM GOAL #4   Title Patient reaches 5" anteriorly with RW support with prostheses with supervision.    Baseline MET 03/22/17   Time 4   Period Weeks   Status Achieved     PT SHORT TERM GOAL #5   Title Patient ambulates 7' with RW & prostheses with modA of 1 person only.    Baseline MET 03/22/17   Time 4   Period Weeks   Status Achieved           PT Long Term Goals - 04/12/17 1155      PT LONG TERM GOAL #1   Title Patient verbalizes and demonstrates understanding of prosthetic care to enable safe use of prostheses.  (Target Date: 04/28/2017)   Time 4   Period Months   Status On-going     PT LONG TERM GOAL #2   Title Patient tolerates wear of TTA prosthesis >90% and TFA >75% of awake hours to enable function throughout his day. (Target Date: 04/28/2017)   Time 4   Period Months   Status On-going     PT LONG TERM GOAL #3   Title Patient performs transfers including sit to/from stand and stand pivot transfers with LRAD & prostheses modified independent.  (Target Date: 04/28/2017)   Time 4   Period Months   Status On-going     PT LONG TERM GOAL #4   Title Patient performs standing balance with LRAD & prostheses reaching 10" anteriorly, reaching to floor, looking over shoulders and managing clothes modified  independent.  (Target Date: 04/28/2017)   Time 4   Period Months   Status On-going     PT LONG TERM GOAL #5   Title Patient ambulates 300' with LRAD & prostheses modified independent to enable community mobility.  (Target Date: 04/28/2017)   Time 4   Period Months   Status On-going     PT LONG TERM GOAL #6   Title Patient negotiates ramps, curbs & stairs with LRAD & prostheses modified independent to enable community access.  (Target Date: 04/28/2017)   Time 4   Period Months   Status On-going     PT LONG TERM GOAL #7   Title Activities of Balance Confidence score >15%.  (Target Date: 04/28/2017)   Baseline Initial ABC 0.0%   Time 4   Period Months   Status On-going               Plan - 04/12/17 1159    Clinical Impression Statement Pt limited by pain and tenderness of left limb during treatment. Todays session focused on balance/gait training and core strengthening. Pt demonstrated slight fatigue with activity and requred Min/Mod A during transfers and minimal cues with activity. Pt slowly progressing towards goals and should benefit from continued PT to progress toward unmet goals.    Rehab Potential Good   PT Frequency 2x / week   PT Duration Other (comment)  4 months   PT Treatment/Interventions ADLs/Self Care Home Management;DME Instruction;Gait training;Stair training;Functional mobility training;Therapeutic activities;Therapeutic exercise;Balance training;Neuromuscular re-education;Patient/family education;Prosthetic Training;Manual techniques   PT Next Visit Plan continue towards LTGs, balance/gait training and core strengthening.    Consulted and Agree with Plan of Care Patient      Patient will benefit from skilled therapeutic intervention in order to improve the following deficits and impairments:  Abnormal gait, Decreased activity tolerance, Decreased balance, Decreased endurance, Decreased knowledge of  use of DME, Decreased mobility, Decreased range of  motion, Decreased skin integrity, Decreased strength, Postural dysfunction, Prosthetic Dependency, Pain  Visit Diagnosis: Unsteadiness on feet  Other abnormalities of gait and mobility  Muscle weakness (generalized)     Problem List Patient Active Problem List   Diagnosis Date Noted  . History of right above knee amputation ([place]) 09/21/2016  . Acquired absence of left leg below knee ([place]) 09/21/2016  . Transaminitis   . Diabetic peripheral neuropathy ([place]) 07/21/2016  . Renal failure 07/04/2016  . Hyperuricemia 06/21/2016  . HTN (hypertension) 01/05/2016  . Diabetes type 2, uncontrolled ([place]) 01/05/2016  . Hypothyroidism 05/10/2011   Minnie Shi, SPTA  Maheen Cwikla 04/12/2017, 1:33 PM  [facility] [address] [place], [place], [address] Phone: [phone]   Fax:  [phone]  Name: Abdon Petrosky MRN: [id number] Date of Birth: 13-Feb-1956

## 2017-04-17 ENCOUNTER — Encounter: Payer: Self-pay | Admitting: Physical Therapy

## 2017-04-17 ENCOUNTER — Ambulatory Visit: Payer: BLUE CROSS/BLUE SHIELD | Attending: Family Medicine | Admitting: Physical Therapy

## 2017-04-17 DIAGNOSIS — M6281 Muscle weakness (generalized): Secondary | ICD-10-CM | POA: Insufficient documentation

## 2017-04-17 DIAGNOSIS — R2681 Unsteadiness on feet: Secondary | ICD-10-CM | POA: Insufficient documentation

## 2017-04-17 DIAGNOSIS — R2689 Other abnormalities of gait and mobility: Secondary | ICD-10-CM | POA: Insufficient documentation

## 2017-04-17 NOTE — Therapy (Signed)
[facility] [address] [place], [place], [address] Phone: [phone]   Fax:  [phone]  Physical Therapy Treatment  Patient Details  Name: Todd [name] MRN: [id number] Date of Birth: Sep 25, 1955 Referring Provider: [name], MD   Encounter Date: 04/17/2017  PT End of Session - 04/17/17 2143    Visit Number  27    Number of Visits  35    Date for PT Re-Evaluation  04/28/17    Authorization Type  BCBS primary & Medicaid secondary    PT Start Time  1015    PT Stop Time  1040    PT Time Calculation (min)  25 min    Equipment Utilized During Treatment  Gait belt    Activity Tolerance  Patient limited by pain    Behavior During Therapy  [facility] for tasks assessed/performed       Past Medical History:  Diagnosis Date  . AKI (acute kidney injury) ([place])   . Constipated   . Diabetes mellitus without complication ([place])   . Diarrhea   . Elevated LFTs   . Goiter   . Gout   . Hyperlipidemia   . Hypertension   . Leukocytosis   . Reactive thrombocytosis   . Right BKA infection ([place]) 08/2016  . Right leg pain   . Sepsis due to undetermined organism ([place])   . Thyroid disease   . Wound infection after surgery 08/2016    Past Surgical History:  Procedure Laterality Date  . THYROID SURGERY      There were no vitals filed for this visit.                   [facility] Adult PT Treatment/Exercise - 04/17/17 1015      Transfers   Transfers  Sit to Stand;Stand to Sit;Squat Pivot Transfers    Sit to Stand  4: Min assist;With upper extremity assist;With armrests;From chair/3-in-1 to RW   to RW   Sit to Stand Details  Verbal cues for sequencing;Verbal cues for technique    Stand to Sit  5: Supervision;With upper extremity assist;With armrests;To chair/3-in-1 from RW   from RW   Stand to Sit Details (indicate cue type and reason)  Verbal cues for technique    Squat Pivot Transfers  5: Supervision using  TTA prosthesis   using TTA prosthesis     Ambulation/Gait   Ambulation/Gait  Yes    Ambulation/Gait Assistance  4: Min assist RW & prostheses   RW & prostheses   Ambulation/Gait Assistance Details  manual cues to control TFA knee ~1/3 of steps, verbal cues on sequence & technique    Ambulation Distance (Feet)  30 Feet    Assistive device  Rolling walker;Prostheses    Ambulation Surface  Indoor;Level      Prosthetics   Prosthetic Care Comments   Pt has appt with prosthetist after PT today. PT called prosthetist about area on proximal tibia & adding TFA manual lock to knee    Residual limb condition   proximal tibia has bruise / dry scab 2cm X 1cm    Education Provided  Skin check;Residual limb care;Proper wear schedule/adjustment PT recommended not wearing prostheses for 1 week to heal wou   PT recommended not wearing prostheses for 1 week to heal wou   Person(s) Educated  Patient;Parent(s)    Education Method  Explanation;Verbal cues    Education Method  Verbalized understanding;Verbal cues required  PT Short Term Goals - 04/12/17 1155      PT SHORT TERM GOAL #1   Title  Patient verbalizes proper cleaning of prostheses.     Baseline  MET 03/20/17    Time  4    Period  Weeks    Status  Achieved      PT SHORT TERM GOAL #2   Title  Patient tolerates wear of TTA prosthesis >8 hrs total /day & TFA prosthesis >2 hrs /day without skin issues.     Baseline  Partially MET 03/22/17 Pt reports wearing TTA prosthesis 6-8 hrs  but not wearing TFA prosthesis, liner only    Time  4    Period  Weeks    Status  Partially Met      PT SHORT TERM GOAL #3   Title  Sit to /from stand w/c to RW with supervision with prostheses.     Baseline  03/22/17 NOT MET ModA to RW & MinA to sink    Time  4    Period  Weeks    Status  Not Met      PT SHORT TERM GOAL #4   Title  Patient reaches 5" anteriorly with RW support with prostheses with supervision.     Baseline  MET 03/22/17     Time  4    Period  Weeks    Status  Achieved      PT SHORT TERM GOAL #5   Title  Patient ambulates 55' with RW & prostheses with modA of 1 person only.     Baseline  MET 03/22/17    Time  4    Period  Weeks    Status  Achieved        PT Long Term Goals - 04/12/17 1155      PT LONG TERM GOAL #1   Title  Patient verbalizes and demonstrates understanding of prosthetic care to enable safe use of prostheses.  (Target Date: 04/28/2017)    Time  4    Period  Months    Status  On-going      PT LONG TERM GOAL #2   Title  Patient tolerates wear of TTA prosthesis >90% and TFA >75% of awake hours to enable function throughout his day. (Target Date: 04/28/2017)    Time  4    Period  Months    Status  On-going      PT LONG TERM GOAL #3   Title  Patient performs transfers including sit to/from stand and stand pivot transfers with LRAD & prostheses modified independent.  (Target Date: 04/28/2017)    Time  4    Period  Months    Status  On-going      PT LONG TERM GOAL #4   Title  Patient performs standing balance with LRAD & prostheses reaching 10" anteriorly, reaching to floor, looking over shoulders and managing clothes modified independent.  (Target Date: 04/28/2017)    Time  4    Period  Months    Status  On-going      PT LONG TERM GOAL #5   Title  Patient ambulates 300' with LRAD & prostheses modified independent to enable community mobility.  (Target Date: 04/28/2017)    Time  4    Period  Months    Status  On-going      PT LONG TERM GOAL #6   Title  Patient negotiates ramps, curbs & stairs with LRAD & prostheses modified independent to enable  community access.  (Target Date: 04/28/2017)    Time  4    Period  Months    Status  On-going      PT LONG TERM GOAL #7   Title  Activities of Balance Confidence score >15%.  (Target Date: 04/28/2017)    Baseline  Initial ABC 0.0%    Time  4    Period  Months    Status  On-going            Plan - 04/17/17 2144     Clinical Impression Statement  Patient needs to remove prostheses to let TTA residual limb heal. Pt would benefit from a TFA prosthesis manual lock on knee.      Rehab Potential  Good    PT Frequency  2x / week    PT Duration  Other (comment) 4 months   4 months   PT Treatment/Interventions  ADLs/Self Care Home Management;DME Instruction;Gait training;Stair training;Functional mobility training;Therapeutic activities;Therapeutic exercise;Balance training;Neuromuscular re-education;Patient/family education;Prosthetic Training;Manual techniques    PT Next Visit Plan  Hold PT for 1 week and reassess limb. Check LTGs.     Consulted and Agree with Plan of Care  Patient       Patient will benefit from skilled therapeutic intervention in order to improve the following deficits and impairments:  Abnormal gait, Decreased activity tolerance, Decreased balance, Decreased endurance, Decreased knowledge of use of DME, Decreased mobility, Decreased range of motion, Decreased skin integrity, Decreased strength, Postural dysfunction, Prosthetic Dependency, Pain  Visit Diagnosis: Unsteadiness on feet  Other abnormalities of gait and mobility  Muscle weakness (generalized)     Problem List Patient Active Problem List   Diagnosis Date Noted  . History of right above knee amputation ([place]) 09/21/2016  . Acquired absence of left leg below knee ([place]) 09/21/2016  . Transaminitis   . Diabetic peripheral neuropathy ([place]) 07/21/2016  . Renal failure 07/04/2016  . Hyperuricemia 06/21/2016  . HTN (hypertension) 01/05/2016  . Diabetes type 2, uncontrolled ([place]) 01/05/2016  . Hypothyroidism 05/10/2011    Azucena Dart PT, DPT 04/17/2017, 9:48 PM  [facility] [address] [place], [place], [address] Phone: [phone]   Fax:  [phone]  Name: Todd [name] MRN: [id number] Date of Birth: [birth year]

## 2017-04-19 ENCOUNTER — Encounter: Payer: BLUE CROSS/BLUE SHIELD | Admitting: Physical Therapy

## 2017-04-24 ENCOUNTER — Ambulatory Visit: Payer: BLUE CROSS/BLUE SHIELD | Admitting: Physical Therapy

## 2017-04-24 ENCOUNTER — Encounter: Payer: Self-pay | Admitting: Physical Therapy

## 2017-04-24 DIAGNOSIS — R2681 Unsteadiness on feet: Secondary | ICD-10-CM | POA: Diagnosis not present

## 2017-04-24 DIAGNOSIS — R2689 Other abnormalities of gait and mobility: Secondary | ICD-10-CM

## 2017-04-24 DIAGNOSIS — M6281 Muscle weakness (generalized): Secondary | ICD-10-CM

## 2017-04-24 NOTE — Therapy (Signed)
[facility] [address] [place], [place], [address] Phone: [phone]   Fax:  [phone]  Physical Therapy Treatment  Patient Details  Name: Todd [name] MRN: [id number] Date of Birth: January 08, 1956 Referring Provider: [name], MD   Encounter Date: 04/24/2017  PT End of Session - 04/24/17 1057    Visit Number  28    Number of Visits  35    Date for PT Re-Evaluation  04/28/17    Authorization Type  BCBS primary & Medicaid secondary    PT Start Time  1016    PT Stop Time  1025    PT Time Calculation (min)  9 min    Equipment Utilized During Treatment  Gait belt    Activity Tolerance  Patient limited by pain    Behavior During Therapy  [facility] for tasks assessed/performed       Past Medical History:  Diagnosis Date  . AKI (acute kidney injury) ([place])   . Constipated   . Diabetes mellitus without complication ([place])   . Diarrhea   . Elevated LFTs   . Goiter   . Gout   . Hyperlipidemia   . Hypertension   . Leukocytosis   . Reactive thrombocytosis   . Right BKA infection ([place]) 08/2016  . Right leg pain   . Sepsis due to undetermined organism ([place])   . Thyroid disease   . Wound infection after surgery 08/2016    Past Surgical History:  Procedure Laterality Date  . THYROID SURGERY      There were no vitals filed for this visit.  Subjective Assessment - 04/24/17 1017    Subjective  Patient's left leg is still sore. Even pressure of bed sheets bothers it.     Patient is accompained by:  Family member    Pertinent History  R  TFA 09/12/2016, L TTA 08/12/2016, gout, HTN, DM, Neuropathy, renal failure    Limitations  Standing;Walking;House hold activities    Patient Stated Goals  Use prostheses to get back into community. Get out of w/c.    Currently in Pain?  Yes    Pain Score  8     Pain Location  Leg    Pain Orientation  Left proximal tibial crest    Pain Descriptors / Indicators  Aching;Throbbing     Pain Type  Chronic pain    Pain Onset  More than a month ago    Pain Frequency  Constant    Aggravating Factors   prosthesis wear, weight bearing, pressure    Pain Relieving Factors  medications, ice, heat    Effect of Pain on Daily Activities  unable to wear left TTA prosthesis    Multiple Pain Sites  No      Prosthetic Training Pt still has red, edematous area at proximal tibial crest that is darkened and appears to be superficial scab. PT advised to wear shrinker but not liner or prosthesis until heals. Ice area 8-10 minutes 3 times /day.  PT demo, instructed in manual prosthetic knee function including 2 options for sit to stand & 2 options for stand to sit.  Pt to practice unlocking knee unit seated. Pt return demo understanding.                         PT Education - 04/24/17 1056    Education provided  Yes    Education Details  how manual lock added to TFA prosthesis works  including how sit to/from stand will work with lock    Person(s) Educated  Patient;Parent(s)    Methods  Explanation;Demonstration;Verbal cues    Comprehension  Verbalized understanding;Need further instruction       PT Short Term Goals - 04/12/17 1155      PT SHORT TERM GOAL #1   Title  Patient verbalizes proper cleaning of prostheses.     Baseline  MET 03/20/17    Time  4    Period  Weeks    Status  Achieved      PT SHORT TERM GOAL #2   Title  Patient tolerates wear of TTA prosthesis >8 hrs total /day & TFA prosthesis >2 hrs /day without skin issues.     Baseline  Partially MET 03/22/17 Pt reports wearing TTA prosthesis 6-8 hrs  but not wearing TFA prosthesis, liner only    Time  4    Period  Weeks    Status  Partially Met      PT SHORT TERM GOAL #3   Title  Sit to /from stand w/c to RW with supervision with prostheses.     Baseline  03/22/17 NOT MET ModA to RW & MinA to sink    Time  4    Period  Weeks    Status  Not Met      PT SHORT TERM GOAL #4   Title  Patient  reaches 5" anteriorly with RW support with prostheses with supervision.     Baseline  MET 03/22/17    Time  4    Period  Weeks    Status  Achieved      PT SHORT TERM GOAL #5   Title  Patient ambulates 68' with RW & prostheses with modA of 1 person only.     Baseline  MET 03/22/17    Time  4    Period  Weeks    Status  Achieved        PT Long Term Goals - 04/12/17 1155      PT LONG TERM GOAL #1   Title  Patient verbalizes and demonstrates understanding of prosthetic care to enable safe use of prostheses.  (Target Date: 04/28/2017)    Time  4    Period  Months    Status  On-going      PT LONG TERM GOAL #2   Title  Patient tolerates wear of TTA prosthesis >90% and TFA >75% of awake hours to enable function throughout his day. (Target Date: 04/28/2017)    Time  4    Period  Months    Status  On-going      PT LONG TERM GOAL #3   Title  Patient performs transfers including sit to/from stand and stand pivot transfers with LRAD & prostheses modified independent.  (Target Date: 04/28/2017)    Time  4    Period  Months    Status  On-going      PT LONG TERM GOAL #4   Title  Patient performs standing balance with LRAD & prostheses reaching 10" anteriorly, reaching to floor, looking over shoulders and managing clothes modified independent.  (Target Date: 04/28/2017)    Time  4    Period  Months    Status  On-going      PT LONG TERM GOAL #5   Title  Patient ambulates 300' with LRAD & prostheses modified independent to enable community mobility.  (Target Date: 04/28/2017)    Time  4  Period  Months    Status  On-going      PT LONG TERM GOAL #6   Title  Patient negotiates ramps, curbs & stairs with LRAD & prostheses modified independent to enable community access.  (Target Date: 04/28/2017)    Time  4    Period  Months    Status  On-going      PT LONG TERM GOAL #7   Title  Activities of Balance Confidence score >15%.  (Target Date: 04/28/2017)    Baseline  Initial ABC 0.0%     Time  4    Period  Months    Status  On-going            Plan - 04/24/17 1058    Clinical Impression Statement  Patient's left residual limb still has wound with inflammation with recommendation to not wear prosthesis as will make it worse or open area up. Pt has basic understanding how manual lock on TFA works and practicing unlocking it sitting with prosthesis in his lap.     Rehab Potential  Good    PT Frequency  2x / week    PT Duration  Other (comment) 4 months    PT Treatment/Interventions  ADLs/Self Care Home Management;DME Instruction;Gait training;Stair training;Functional mobility training;Therapeutic activities;Therapeutic exercise;Balance training;Neuromuscular re-education;Patient/family education;Prosthetic Training;Manual techniques    PT Next Visit Plan  Hold PT for 3 weeks to allow wound to heal. Reassess 12/5, if able to wear prosthesis assess LTGs and update plan of care    Consulted and Agree with Plan of Care  Patient       Patient will benefit from skilled therapeutic intervention in order to improve the following deficits and impairments:  Abnormal gait, Decreased activity tolerance, Decreased balance, Decreased endurance, Decreased knowledge of use of DME, Decreased mobility, Decreased range of motion, Decreased skin integrity, Decreased strength, Postural dysfunction, Prosthetic Dependency, Pain  Visit Diagnosis: Other abnormalities of gait and mobility  Muscle weakness (generalized)     Problem List Patient Active Problem List   Diagnosis Date Noted  . History of right above knee amputation ([place]) 09/21/2016  . Acquired absence of left leg below knee ([place]) 09/21/2016  . Transaminitis   . Diabetic peripheral neuropathy ([place]) 07/21/2016  . Renal failure 07/04/2016  . Hyperuricemia 06/21/2016  . HTN (hypertension) 01/05/2016  . Diabetes type 2, uncontrolled ([place]) 01/05/2016  . Hypothyroidism 05/10/2011    Sueellen Kayes  PT, DPT 04/24/2017, 11:01  AM  [facility] [address] [place], [place], [address] Phone: [phone]   Fax:  [phone]  Name: Junius Faucett MRN: [id number] Date of Birth: 11-29-1955

## 2017-04-26 ENCOUNTER — Encounter: Payer: BLUE CROSS/BLUE SHIELD | Admitting: Physical Therapy

## 2017-05-01 ENCOUNTER — Encounter: Payer: BLUE CROSS/BLUE SHIELD | Admitting: Physical Therapy

## 2017-05-03 ENCOUNTER — Encounter: Payer: BLUE CROSS/BLUE SHIELD | Admitting: Physical Therapy

## 2017-05-08 ENCOUNTER — Encounter: Payer: BLUE CROSS/BLUE SHIELD | Admitting: Physical Therapy

## 2017-05-10 ENCOUNTER — Encounter: Payer: BLUE CROSS/BLUE SHIELD | Admitting: Physical Therapy

## 2017-05-11 ENCOUNTER — Telehealth: Payer: Self-pay | Admitting: Family Medicine

## 2017-05-11 ENCOUNTER — Encounter: Payer: Self-pay | Admitting: Physical Therapy

## 2017-05-12 NOTE — Telephone Encounter (Signed)
Left detailed message. Rx placed up front.

## 2017-05-12 NOTE — Telephone Encounter (Signed)
Sister states they would like a bed side table rx for patient. She thinks it will really help him. Please advise and write is this is possible.

## 2017-05-12 NOTE — Telephone Encounter (Signed)
Please  write and I will sign. Thanks, WS 

## 2017-05-16 ENCOUNTER — Ambulatory Visit: Payer: Medicaid Other | Admitting: Family Medicine

## 2017-05-16 ENCOUNTER — Encounter: Payer: Self-pay | Admitting: Family Medicine

## 2017-05-16 VITALS — BP 128/70 | HR 73 | Temp 97.5°F | Ht 68.0 in

## 2017-05-16 DIAGNOSIS — I10 Essential (primary) hypertension: Secondary | ICD-10-CM

## 2017-05-16 DIAGNOSIS — E1165 Type 2 diabetes mellitus with hyperglycemia: Secondary | ICD-10-CM

## 2017-05-16 DIAGNOSIS — N3942 Incontinence without sensory awareness: Secondary | ICD-10-CM | POA: Diagnosis not present

## 2017-05-16 DIAGNOSIS — E039 Hypothyroidism, unspecified: Secondary | ICD-10-CM

## 2017-05-16 LAB — URINALYSIS
Bilirubin, UA: NEGATIVE
GLUCOSE, UA: NEGATIVE
Ketones, UA: NEGATIVE
LEUKOCYTES UA: NEGATIVE
Nitrite, UA: NEGATIVE
PH UA: 5 (ref 5.0–7.5)
PROTEIN UA: NEGATIVE
RBC, UA: NEGATIVE
Specific Gravity, UA: 1.01 (ref 1.005–1.030)
Urobilinogen, Ur: 0.2 mg/dL (ref 0.2–1.0)

## 2017-05-16 LAB — BAYER DCA HB A1C WAIVED: HB A1C: 5.5 % (ref <7.0)

## 2017-05-16 MED ORDER — ALLOPURINOL 100 MG PO TABS: 100.0000 mg | ORAL_TABLET | Freq: Every day | ORAL | 6 refills | Status: DC

## 2017-05-16 MED ORDER — CARVEDILOL 3.125 MG PO TABS: 3.1250 mg | ORAL_TABLET | Freq: Two times a day (BID) | ORAL | 2 refills | Status: DC

## 2017-05-16 MED ORDER — CARVEDILOL 3.125 MG PO TABS: 3.1250 mg | ORAL_TABLET | Freq: Two times a day (BID) | ORAL | 0 refills | Status: DC

## 2017-05-16 MED ORDER — ATORVASTATIN CALCIUM 40 MG PO TABS: 40.0000 mg | ORAL_TABLET | Freq: Every day | ORAL | Status: DC

## 2017-05-16 MED ORDER — MELOXICAM 15 MG PO TABS: 15.0000 mg | ORAL_TABLET | Freq: Every day | ORAL | 5 refills | Status: DC

## 2017-05-16 MED ORDER — TRAMADOL HCL 50 MG PO TABS: 50.0000 mg | ORAL_TABLET | Freq: Four times a day (QID) | ORAL | 1 refills | Status: DC | PRN

## 2017-05-16 MED ORDER — COLCRYS 0.6 MG PO TABS: 0.6000 mg | ORAL_TABLET | Freq: Two times a day (BID) | ORAL | 1 refills | Status: DC | PRN

## 2017-05-16 NOTE — Progress Notes (Addendum)
Subjective:  Patient ID: Todd [name],  male    DOB: 05-24-1956  Age: 61 y.o.    CC: Hypertension (pt here today for routine follow up of his chronic medical conditions)   HPI [name] presents for  follow-up of hypertension. Patient has no history of headache chest pain or shortness of breath or recent cough. Patient also denies symptoms of TIA such as numbness weakness lateralizing. Patient denies side effects from medication. States taking it regularly.  Patient also  in for follow-up of elevated cholesterol. Doing well without complaints on current medication. Denies side effects of statin including myalgia and arthralgia and nausea. Also in today for liver function testing. Currently no chest pain, shortness of breath or other cardiovascular related symptoms noted.  Follow-up of diabetes. Patient does not check blood sugar at home.  Patient denies symptoms such as polyuria, polydipsia, excessive hunger, nausea No significant hypoglycemic spells noted. Medications reviewed. Pt reports taking them regularly. Pt. denies complication/adverse reaction today.  Continues rehab for L BKA. Tenderness noted at left tibial tuberosity. Rehab clinic working with him closely to keep it padded, promote healing.  History Sena has a past medical history of AKI (acute kidney injury) (Todd [name]), Constipated, Diabetes mellitus without complication (Todd [name]), Diarrhea, Elevated LFTs, Goiter, Gout, Hyperlipidemia, Hypertension, Leukocytosis, Reactive thrombocytosis, Right BKA infection ([place]) (08/2016), Right leg pain, Sepsis due to undetermined organism [facility]), Thyroid disease, and Wound infection after surgery (08/2016).   He has a past surgical history that includes Thyroid surgery; Lower Extremity Angiography (Bilateral, 08/11/2016); ABDOMINAL AORTOGRAM (N/A, 08/11/2016); PERIPHERAL VASCULAR BALLOON ANGIOPLASTY (Left, 08/11/2016); ABDOMINAL AORTOGRAM W/LOWER EXTREMITY (N/A, 08/15/2016); Amputation (Right,  08/17/2016); Amputation (Right, 09/12/2016); Application if wound vac (Right, 09/12/2016); and Amputation (Left, 08/12/2016).   His family history includes Diabetes in his maternal aunt and maternal uncle; Heart disease in his mother; Pneumonia in his father.He reports that he has been smoking cigarettes.  He has a 33.75 pack-year smoking history. he has never used smokeless tobacco. He reports that he does not drink alcohol or use drugs.  Current Outpatient Medications on File Prior to Visit  Medication Sig Dispense Refill  . Insulin Glargine (LANTUS SOLOSTAR) 100 UNIT/ML Solostar Pen Inject 10 Units into the skin daily. 15 mL 11  . loperamide (IMODIUM) 2 MG capsule Take 4 mg by mouth as needed for diarrhea or loose stools.     No current facility-administered medications on file prior to visit.     ROS Review of Systems  Constitutional: Negative for chills, diaphoresis, fever and unexpected weight change.  HENT: Negative for congestion, hearing loss, rhinorrhea and sore throat.   Eyes: Negative for visual disturbance.  Respiratory: Negative for cough and shortness of breath.   Cardiovascular: Negative for chest pain.  Gastrointestinal: Negative for abdominal pain, constipation and diarrhea.  Genitourinary: Positive for enuresis. Negative for dysuria and flank pain.       Incontinent of urination. Requires incontinence garments.  Musculoskeletal: Positive for arthralgias and gait problem ([place]). Negative for joint swelling.  Skin: Negative for rash.  Neurological: Negative for dizziness and headaches.  Psychiatric/Behavioral: Negative for dysphoric mood and sleep disturbance.    Objective:  BP 128/70   Pulse 73   Temp (!) 97.5 F (36.4 C) (Oral)   Ht _0  (1.727 m)   BMI 24.33 kg/m   BP Readings from Last 3 Encounters:  05/19/17 (!) 156/104  05/16/17 128/70  04/11/17 (!) 120/100    Wt Readings from Last 3 Encounters:  07/28/17 160  lb (72.6 kg)  07/07/17 160 lb (72.6 kg)    05/26/17 160 lb (72.6 kg)     Physical Exam  Constitutional: He is oriented to person, place, and time. He appears well-developed and well-nourished. No distress.  HENT:  Head: Normocephalic and atraumatic.  Right Ear: External ear normal.  Left Ear: External ear normal.  Nose: Nose normal.  Mouth/Throat: Oropharynx is clear and moist.  Eyes: Conjunctivae and EOM are normal. Pupils are equal, round, and reactive to light.  Neck: Normal range of motion. Neck supple. No thyromegaly present.  Cardiovascular: Normal rate, regular rhythm and normal heart sounds.  No murmur heard. Pulmonary/Chest: Effort normal and breath sounds normal. No respiratory distress. He has no wheezes. He has no rales.  Abdominal: Soft. Bowel sounds are normal. He exhibits no distension. There is no tenderness.  Musculoskeletal:  bilaterl LE amputee, R AKA, L BKA  Lymphadenopathy:    He has no cervical adenopathy.  Neurological: He is alert and oriented to person, place, and time. He has normal reflexes.  Skin: Skin is warm and dry.  Psychiatric: He has a normal mood and affect. His behavior is normal. Judgment and thought content normal.        Assessment & Plan:   Olin was seen today for hypertension.  Diagnoses and all orders for this visit:  Essential hypertension -     CMP14+EGFR -     CBC with Differential/Platelet -     Urinalysis  Hypothyroidism, unspecified type -     CMP14+EGFR -     CBC with Differential/Platelet -     TSH -     T4, Free  Uncontrolled type 2 diabetes mellitus with hyperglycemia (HCC) -     CMP14+EGFR -     CBC with Differential/Platelet -     Urinalysis -     Microalbumin / creatinine urine ratio -     Bayer DCA Hb A1c Waived  Urinary incontinence without sensory awareness  Other orders -     allopurinol (ZYLOPRIM) 100 MG tablet; Take 1 tablet (100 mg total) by mouth daily. -     atorvastatin (LIPITOR) 40 MG tablet; Take 1 tablet (40 mg total) by mouth  daily at 6 PM. -     Discontinue: carvedilol (COREG) 3.125 MG tablet; Take 1 tablet (3.125 mg total) by mouth 2 (two) times daily with a meal. -     COLCRYS 0.6 MG tablet; Take 1 tablet (0.6 mg total) by mouth 2 (two) times daily as needed. -     meloxicam (MOBIC) 15 MG tablet; Take 1 tablet (15 mg total) by mouth daily. -     Discontinue: traMADol (ULTRAM) 50 MG tablet; Take 1 tablet (50 mg total) by mouth every 6 (six) hours as needed for moderate pain. -     carvedilol (COREG) 3.125 MG tablet; Take 1 tablet (3.125 mg total) by mouth 2 (two) times daily with a meal. (Patient taking differently: Take 3.125 mg by mouth daily. ) -     traMADol (ULTRAM) 50 MG tablet; Take 1 tablet (50 mg total) by mouth every 6 (six) hours as needed for moderate pain. (Patient taking differently: Take 50 mg by mouth every morning. )   I am having [name] maintain his Insulin Glargine, loperamide, allopurinol, atorvastatin, COLCRYS, meloxicam, carvedilol, and traMADol.  Meds ordered this encounter  Medications  . allopurinol (ZYLOPRIM) 100 MG tablet    Sig: Take 1 tablet (100 mg total) by mouth daily.  Dispense:  30 tablet    Refill:  6  . atorvastatin (LIPITOR) 40 MG tablet    Sig: Take 1 tablet (40 mg total) by mouth daily at 6 PM.  . DISCONTD: carvedilol (COREG) 3.125 MG tablet    Sig: Take 1 tablet (3.125 mg total) by mouth 2 (two) times daily with a meal.    Dispense:  60 tablet    Refill:  0  . COLCRYS 0.6 MG tablet    Sig: Take 1 tablet (0.6 mg total) by mouth 2 (two) times daily as needed.    Dispense:  60 tablet    Refill:  1  . meloxicam (MOBIC) 15 MG tablet    Sig: Take 1 tablet (15 mg total) by mouth daily.    Dispense:  30 tablet    Refill:  5  . DISCONTD: traMADol (ULTRAM) 50 MG tablet    Sig: Take 1 tablet (50 mg total) by mouth every 6 (six) hours as needed for moderate pain.    Dispense:  60 tablet    Refill:  1  . carvedilol (COREG) 3.125 MG tablet    Sig: Take 1 tablet  (3.125 mg total) by mouth 2 (two) times daily with a meal.    Dispense:  60 tablet    Refill:  2  . traMADol (ULTRAM) 50 MG tablet    Sig: Take 1 tablet (50 mg total) by mouth every 6 (six) hours as needed for moderate pain.    Dispense:  60 tablet    Refill:  1     Follow-up: Return in about 3 months (around 08/14/2017).  [name], M.D.

## 2017-05-17 ENCOUNTER — Ambulatory Visit: Payer: BLUE CROSS/BLUE SHIELD | Attending: Family Medicine | Admitting: Physical Therapy

## 2017-05-17 ENCOUNTER — Other Ambulatory Visit: Payer: Self-pay | Admitting: Family Medicine

## 2017-05-17 ENCOUNTER — Encounter: Payer: Self-pay | Admitting: Physical Therapy

## 2017-05-17 DIAGNOSIS — M6281 Muscle weakness (generalized): Secondary | ICD-10-CM | POA: Diagnosis present

## 2017-05-17 LAB — MICROALBUMIN / CREATININE URINE RATIO
Creatinine, Urine: 86.9 mg/dL
MICROALB/CREAT RATIO: 43.6 mg/g{creat} — AB (ref 0.0–30.0)
Microalbumin, Urine: 37.9 ug/mL

## 2017-05-17 MED ORDER — LEVOTHYROXINE SODIUM 75 MCG PO TABS: 75.0000 ug | ORAL_TABLET | Freq: Every day | ORAL | 1 refills | Status: DC

## 2017-05-17 NOTE — Therapy (Signed)
[facility] [address] [place], [place], [address] Phone: [phone]   Fax:  [phone]  Physical Therapy Treatment  Patient Details  Name: Todd [name] MRN: [id number] Date of Birth: 1955/12/04 Referring Provider: [name], MD   Encounter Date: 05/17/2017  PT End of Session - 05/17/17 1121    Visit Number  29    Number of Visits  35    Date for PT Re-Evaluation  04/28/17    Authorization Type  BCBS primary & Medicaid secondary    PT Start Time  1100    PT Stop Time  1121    PT Time Calculation (min)  21 min    Equipment Utilized During Treatment  Gait belt    Activity Tolerance  Patient limited by pain    Behavior During Therapy  WFL for tasks assessed/performed       Past Medical History:  Diagnosis Date  . AKI (acute kidney injury) ([place])   . Constipated   . Diabetes mellitus without complication ([facility])   . Diarrhea   . Elevated LFTs   . Goiter   . Gout   . Hyperlipidemia   . Hypertension   . Leukocytosis   . Reactive thrombocytosis   . Right BKA infection ([place]) 08/2016  . Right leg pain   . Sepsis due to undetermined organism ([place])   . Thyroid disease   . Wound infection after surgery 08/2016    Past Surgical History:  Procedure Laterality Date  . ABDOMINAL AORTOGRAM N/A 08/11/2016   Procedure: Abdominal Aortogram;  Surgeon: Waynetta Sandy, MD;  Location: [facility];  Service: Cardiovascular;  Laterality: N/A;  . ABDOMINAL AORTOGRAM W/LOWER EXTREMITY N/A 08/15/2016   Procedure: Abdominal Aortogram w/Lower Extremity;  Surgeon: Elam Dutch, MD;  Location: [facility];  Service: Cardiovascular;  Laterality: N/A;  . AMPUTATION Right 08/17/2016   Procedure: RIGHT BELOW KNEE AMPUTATION;  Surgeon: Elam Dutch, MD;  Location: [facility];  Service: Vascular;  Laterality: Right;  . AMPUTATION Right 09/12/2016   Procedure: AMPUTATION ABOVE KNEE;  Surgeon: Newt Minion, MD;   Location: [facility];  Service: Orthopedics;  Laterality: Right;  . AMPUTATION Left 08/12/2016   Procedure: LEFT BELOW KNEE AMPUTATION;  Surgeon: Newt Minion, MD;  Location: [facility];  Service: Orthopedics;  Laterality: Left;  . APPLICATION OF WOUND VAC Right 09/12/2016   Procedure: APPLICATION OF WOUND VAC ABOVE KNEE;  Surgeon: Newt Minion, MD;  Location: [facility];  Service: Orthopedics;  Laterality: Right;  . LOWER EXTREMITY ANGIOGRAPHY Bilateral 08/11/2016   Procedure: Lower Extremity Angiography;  Surgeon: Waynetta Sandy, MD;  Location: [facility];  Service: Cardiovascular;  Laterality: Bilateral;  . PERIPHERAL VASCULAR BALLOON ANGIOPLASTY Left 08/11/2016   Procedure: Peripheral Vascular Balloon Angioplasty;  Surgeon: Waynetta Sandy, MD;  Location: [facility];  Service: Cardiovascular;  Laterality: Left;  SFA  . THYROID SURGERY      There were no vitals filed for this visit.  Subjective Assessment - 05/17/17 1106    Patient is accompained by:  Family member    Pertinent History  R  TFA 09/12/2016, L TTA 08/12/2016, gout, HTN, DM, Neuropathy, renal failure    Limitations  Standing;Walking;House hold activities    Patient Stated Goals  Use prostheses to get back into community. Get out of w/c.    Pain Score  10-Worst pain ever    Pain Location  Knee Bone spur on  L knee    Pain Orientation  Left    Pain Descriptors / Indicators  Aching;Sore       Pt has scabbed over wound on skin below L knee/anterior surface. Wound shows small amout of drainage. Wound not healed enough to wear leg without causing increased skin break down. No falls to report. PT scheduled an appointment to see Dr. Sharol Given Monday Dec 10th at 8am. PT instructed not to wear prosthesis until wound heals. Pt instructed to call PT for another appt when wound is healed. PT held until would is healed and pt is able to wear prosthesis. Patient verbalized understanding.  PT also instructed in ice vs heat and need to  be careful to check temperature on thigh or area that has ability to feel temperature. Pt verbalized understanding.       PT Short Term Goals - 04/12/17 1155      PT SHORT TERM GOAL #1   Title  Patient verbalizes proper cleaning of prostheses.     Baseline  MET 03/20/17    Time  4    Period  Weeks    Status  Achieved      PT SHORT TERM GOAL #2   Title  Patient tolerates wear of TTA prosthesis >8 hrs total /day & TFA prosthesis >2 hrs /day without skin issues.     Baseline  Partially MET 03/22/17 Pt reports wearing TTA prosthesis 6-8 hrs  but not wearing TFA prosthesis, liner only    Time  4    Period  Weeks    Status  Partially Met      PT SHORT TERM GOAL #3   Title  Sit to /from stand w/c to RW with supervision with prostheses.     Baseline  03/22/17 NOT MET ModA to RW & MinA to sink    Time  4    Period  Weeks    Status  Not Met      PT SHORT TERM GOAL #4   Title  Patient reaches 5" anteriorly with RW support with prostheses with supervision.     Baseline  MET 03/22/17    Time  4    Period  Weeks    Status  Achieved      PT SHORT TERM GOAL #5   Title  Patient ambulates 68' with RW & prostheses with modA of 1 person only.     Baseline  MET 03/22/17    Time  4    Period  Weeks    Status  Achieved        PT Long Term Goals - 04/12/17 1155      PT LONG TERM GOAL #1   Title  Patient verbalizes and demonstrates understanding of prosthetic care to enable safe use of prostheses.  (Target Date: 04/28/2017)    Time  4    Period  Months    Status  On-going      PT LONG TERM GOAL #2   Title  Patient tolerates wear of TTA prosthesis >90% and TFA >75% of awake hours to enable function throughout his day. (Target Date: 04/28/2017)    Time  4    Period  Months    Status  On-going      PT LONG TERM GOAL #3   Title  Patient performs transfers including sit to/from stand and stand pivot transfers with LRAD & prostheses modified independent.  (Target Date: 04/28/2017)     Time  4  Period  Months    Status  On-going      PT LONG TERM GOAL #4   Title  Patient performs standing balance with LRAD & prostheses reaching 10" anteriorly, reaching to floor, looking over shoulders and managing clothes modified independent.  (Target Date: 04/28/2017)    Time  4    Period  Months    Status  On-going      PT LONG TERM GOAL #5   Title  Patient ambulates 300' with LRAD & prostheses modified independent to enable community mobility.  (Target Date: 04/28/2017)    Time  4    Period  Months    Status  On-going      PT LONG TERM GOAL #6   Title  Patient negotiates ramps, curbs & stairs with LRAD & prostheses modified independent to enable community access.  (Target Date: 04/28/2017)    Time  4    Period  Months    Status  On-going      PT LONG TERM GOAL #7   Title  Activities of Balance Confidence score >15%.  (Target Date: 04/28/2017)    Baseline  Initial ABC 0.0%    Time  4    Period  Months    Status  On-going            Plan - 05/17/17 1123    Clinical Impression Statement  Patient's left residual limb does not appear to have skin integrity that will enable safe wear or use of TTA prosthesis. Patient would benefit wound check from Dr. Sharol Given so PT arranged an appointment.     Rehab Potential  Good    PT Frequency  2x / week    PT Duration  Other (comment) 4 months    PT Treatment/Interventions  ADLs/Self Care Home Management;DME Instruction;Gait training;Stair training;Functional mobility training;Therapeutic activities;Therapeutic exercise;Balance training;Neuromuscular re-education;Patient/family education;Prosthetic Training;Manual techniques    PT Next Visit Plan  Hold PT to allow wound to heal.  At next visit, assess LTGs and update plan of care    Consulted and Agree with Plan of Care  Patient       Patient will benefit from skilled therapeutic intervention in order to improve the following deficits and impairments:  Abnormal gait, Decreased  activity tolerance, Decreased balance, Decreased endurance, Decreased knowledge of use of DME, Decreased mobility, Decreased range of motion, Decreased skin integrity, Decreased strength, Postural dysfunction, Prosthetic Dependency, Pain  Visit Diagnosis: Muscle weakness (generalized)     Problem List Patient Active Problem List   Diagnosis Date Noted  . History of right above knee amputation ([facility]) 09/21/2016  . Acquired absence of left leg below knee ([place]) 09/21/2016  . Transaminitis   . Diabetic peripheral neuropathy ([facility]) 07/21/2016  . Renal failure 07/04/2016  . Hyperuricemia 06/21/2016  . HTN (hypertension) 01/05/2016  . Diabetes type 2, uncontrolled ([facility]) 01/05/2016  . Hypothyroidism 05/10/2011   Todd [name], SPTA 05/17/2017, 11:30 AM  Todd [name] PT, DPT 05/17/2017, 3:13 PM  [facility] [address] [place], [place], [address] Phone: [phone]   Fax:  [phone]  Name: Todd [name] MRN: [id number] Date of Birth: December 20, 1955

## 2017-05-18 ENCOUNTER — Other Ambulatory Visit: Payer: Self-pay | Admitting: *Deleted

## 2017-05-18 ENCOUNTER — Other Ambulatory Visit: Payer: BLUE CROSS/BLUE SHIELD

## 2017-05-18 DIAGNOSIS — N289 Disorder of kidney and ureter, unspecified: Secondary | ICD-10-CM

## 2017-05-18 DIAGNOSIS — E875 Hyperkalemia: Secondary | ICD-10-CM

## 2017-05-18 LAB — CMP14+EGFR
A/G RATIO: 1.2 (ref 1.2–2.2)
ALBUMIN: 4.5 g/dL (ref 3.6–4.8)
ALT: 40 IU/L (ref 0–44)
AST: 53 IU/L — ABNORMAL HIGH (ref 0–40)
Alkaline Phosphatase: 138 IU/L — ABNORMAL HIGH (ref 39–117)
BUN / CREAT RATIO: 13 (ref 10–24)
BUN: 28 mg/dL — ABNORMAL HIGH (ref 8–27)
Bilirubin Total: 0.7 mg/dL (ref 0.0–1.2)
CALCIUM: 10.3 mg/dL — AB (ref 8.6–10.2)
CO2: 18 mmol/L — ABNORMAL LOW (ref 20–29)
Chloride: 101 mmol/L (ref 96–106)
Creatinine, Ser: 2.15 mg/dL — ABNORMAL HIGH (ref 0.76–1.27)
GFR, EST AFRICAN AMERICAN: 37 mL/min/{1.73_m2} — AB (ref >59)
GFR, EST NON AFRICAN AMERICAN: 32 mL/min/{1.73_m2} — AB (ref >59)
GLOBULIN, TOTAL: 3.8 g/dL (ref 1.5–4.5)
Glucose: 80 mg/dL (ref 65–99)
POTASSIUM: 6 mmol/L — AB (ref 3.5–5.2)
SODIUM: 135 mmol/L (ref 134–144)
TOTAL PROTEIN: 8.3 g/dL (ref 6.0–8.5)

## 2017-05-18 LAB — CBC WITH DIFFERENTIAL/PLATELET
BASOS: 1 %
Basophils Absolute: 0.1 10*3/uL (ref 0.0–0.2)
EOS (ABSOLUTE): 1 10*3/uL — AB (ref 0.0–0.4)
Eos: 12 %
Hematocrit: 44.1 % (ref 37.5–51.0)
Hemoglobin: 15.1 g/dL (ref 13.0–17.7)
IMMATURE GRANS (ABS): 0 10*3/uL (ref 0.0–0.1)
Immature Granulocytes: 0 %
LYMPHS ABS: 2.3 10*3/uL (ref 0.7–3.1)
LYMPHS: 27 %
MCH: 27.5 pg (ref 26.6–33.0)
MCHC: 34.2 g/dL (ref 31.5–35.7)
MCV: 80 fL (ref 79–97)
Monocytes Absolute: 0.4 10*3/uL (ref 0.1–0.9)
Monocytes: 5 %
NEUTROS ABS: 4.6 10*3/uL (ref 1.4–7.0)
Neutrophils: 55 %
PLATELETS: 518 10*3/uL — AB (ref 150–379)
RBC: 5.5 x10E6/uL (ref 4.14–5.80)
RDW: 16 % — ABNORMAL HIGH (ref 12.3–15.4)
WBC: 8.4 10*3/uL (ref 3.4–10.8)

## 2017-05-18 LAB — T4, FREE: FREE T4: 1.89 ng/dL — AB (ref 0.82–1.77)

## 2017-05-18 LAB — TSH: TSH: 0.035 u[IU]/mL — ABNORMAL LOW (ref 0.450–4.500)

## 2017-05-19 ENCOUNTER — Other Ambulatory Visit: Payer: Self-pay

## 2017-05-19 ENCOUNTER — Emergency Department (HOSPITAL_COMMUNITY)
Admission: EM | Admit: 2017-05-19 | Discharge: 2017-05-19 | Disposition: A | Discharge status: Home / Self Care | Payer: BLUE CROSS/BLUE SHIELD | Attending: Emergency Medicine | Admitting: Emergency Medicine

## 2017-05-19 ENCOUNTER — Encounter (HOSPITAL_COMMUNITY): Payer: Self-pay | Admitting: Emergency Medicine

## 2017-05-19 ENCOUNTER — Telehealth: Payer: Self-pay | Admitting: Family Medicine

## 2017-05-19 DIAGNOSIS — E119 Type 2 diabetes mellitus without complications: Secondary | ICD-10-CM | POA: Diagnosis not present

## 2017-05-19 DIAGNOSIS — N179 Acute kidney failure, unspecified: Secondary | ICD-10-CM | POA: Diagnosis not present

## 2017-05-19 DIAGNOSIS — I10 Essential (primary) hypertension: Secondary | ICD-10-CM | POA: Insufficient documentation

## 2017-05-19 DIAGNOSIS — Z79899 Other long term (current) drug therapy: Secondary | ICD-10-CM | POA: Insufficient documentation

## 2017-05-19 DIAGNOSIS — F17210 Nicotine dependence, cigarettes, uncomplicated: Secondary | ICD-10-CM | POA: Diagnosis not present

## 2017-05-19 DIAGNOSIS — Z7982 Long term (current) use of aspirin: Secondary | ICD-10-CM | POA: Diagnosis not present

## 2017-05-19 DIAGNOSIS — R7989 Other specified abnormal findings of blood chemistry: Secondary | ICD-10-CM | POA: Diagnosis present

## 2017-05-19 DIAGNOSIS — E039 Hypothyroidism, unspecified: Secondary | ICD-10-CM | POA: Insufficient documentation

## 2017-05-19 DIAGNOSIS — Z794 Long term (current) use of insulin: Secondary | ICD-10-CM | POA: Diagnosis not present

## 2017-05-19 DIAGNOSIS — E875 Hyperkalemia: Secondary | ICD-10-CM | POA: Insufficient documentation

## 2017-05-19 LAB — CBC WITH DIFFERENTIAL/PLATELET
Basophils Absolute: 0.1 10*3/uL (ref 0.0–0.1)
Basophils Relative: 1 %
Eosinophils Absolute: 1.3 10*3/uL — ABNORMAL HIGH (ref 0.0–0.7)
Eosinophils Relative: 13 %
HCT: 46.4 % (ref 39.0–52.0)
Hemoglobin: 14.8 g/dL (ref 13.0–17.0)
Lymphocytes Relative: 32 %
Lymphs Abs: 3.1 10*3/uL (ref 0.7–4.0)
MCH: 27 pg (ref 26.0–34.0)
MCHC: 31.9 g/dL (ref 30.0–36.0)
MCV: 84.7 fL (ref 78.0–100.0)
Monocytes Absolute: 0.5 10*3/uL (ref 0.1–1.0)
Monocytes Relative: 5 %
Neutro Abs: 4.7 10*3/uL (ref 1.7–7.7)
Neutrophils Relative %: 49 %
Platelets: 556 10*3/uL — ABNORMAL HIGH (ref 150–400)
RBC: 5.48 MIL/uL (ref 4.22–5.81)
RDW: 15.2 % (ref 11.5–15.5)
WBC: 9.5 10*3/uL (ref 4.0–10.5)

## 2017-05-19 LAB — URINALYSIS, ROUTINE W REFLEX MICROSCOPIC
Bilirubin Urine: NEGATIVE
Glucose, UA: NEGATIVE mg/dL
Hgb urine dipstick: NEGATIVE
Ketones, ur: NEGATIVE mg/dL
Leukocytes, UA: NEGATIVE
Nitrite: NEGATIVE
Protein, ur: NEGATIVE mg/dL
Specific Gravity, Urine: 1.013 (ref 1.005–1.030)
pH: 5 (ref 5.0–8.0)

## 2017-05-19 LAB — COMPREHENSIVE METABOLIC PANEL
ALT: 30 U/L (ref 17–63)
AST: 36 U/L (ref 15–41)
Albumin: 4.2 g/dL (ref 3.5–5.0)
Alkaline Phosphatase: 132 U/L — ABNORMAL HIGH (ref 38–126)
Anion gap: 6 (ref 5–15)
BUN: 28 mg/dL — ABNORMAL HIGH (ref 6–20)
CO2: 21 mmol/L — ABNORMAL LOW (ref 22–32)
Calcium: 9.7 mg/dL (ref 8.9–10.3)
Chloride: 104 mmol/L (ref 101–111)
Creatinine, Ser: 2.26 mg/dL — ABNORMAL HIGH (ref 0.61–1.24)
GFR calc Af Amer: 34 mL/min — ABNORMAL LOW (ref >60)
GFR calc non Af Amer: 29 mL/min — ABNORMAL LOW (ref >60)
Glucose, Bld: 84 mg/dL (ref 65–99)
Potassium: 5.7 mmol/L — ABNORMAL HIGH (ref 3.5–5.1)
Sodium: 131 mmol/L — ABNORMAL LOW (ref 135–145)
Total Bilirubin: 1.2 mg/dL (ref 0.3–1.2)
Total Protein: 8.9 g/dL — ABNORMAL HIGH (ref 6.5–8.1)

## 2017-05-19 LAB — BASIC METABOLIC PANEL
BUN/Creatinine Ratio: 10 (ref 10–24)
BUN: 27 mg/dL (ref 8–27)
CALCIUM: 9.6 mg/dL (ref 8.6–10.2)
CHLORIDE: 101 mmol/L (ref 96–106)
CO2: 16 mmol/L — AB (ref 20–29)
Creatinine, Ser: 2.64 mg/dL — ABNORMAL HIGH (ref 0.76–1.27)
GFR, EST AFRICAN AMERICAN: 29 mL/min/{1.73_m2} — AB (ref >59)
GFR, EST NON AFRICAN AMERICAN: 25 mL/min/{1.73_m2} — AB (ref >59)
Glucose: 70 mg/dL (ref 65–99)
POTASSIUM: 5.4 mmol/L — AB (ref 3.5–5.2)
SODIUM: 134 mmol/L (ref 134–144)

## 2017-05-19 MED ORDER — SODIUM POLYSTYRENE SULFONATE 15 GM/60ML PO SUSP
30.0000 g | Freq: Once | ORAL | Status: AC
  Administered 2017-05-19: 30 g via ORAL
  Filled 2017-05-19: qty 120

## 2017-05-19 MED ORDER — OXYCODONE-ACETAMINOPHEN 5-325 MG PO TABS: 1.0000 | ORAL_TABLET | ORAL | 0 refills | Status: DC | PRN

## 2017-05-19 MED ORDER — OXYCODONE-ACETAMINOPHEN 5-325 MG PO TABS
1.0000 | ORAL_TABLET | Freq: Once | ORAL | Status: AC
  Administered 2017-05-19: 1 via ORAL
  Filled 2017-05-19: qty 1

## 2017-05-19 NOTE — ED Triage Notes (Signed)
Pt reports was sent over by Dr. Gerarda Fraction offices for abnormal kidney function. Pt reports had lab work completed on 12/4 and 12/5. Pt denies any pain,urinary problems, fever,chills. Pt reports "I feel fine."

## 2017-05-19 NOTE — Discharge Instructions (Signed)
Stop taking meloxicam(mobic). Avodi other NSAIDs (ibuprofen, aleve, advil, etc). Take prescribed medication for pain instead until you can follow-up with your PCP. Hold your lisinopril for 3 days. Restart it again Tuesday. Drink plenty of water the next several days. I want you to have repeat blood work again this upcoming week.

## 2017-05-19 NOTE — Telephone Encounter (Signed)
Spoke to pt again and advised him to call 911 or I could call and have them come take him by ambulance but pt declined saying he will try to find someone to take him and will call back if he can't.

## 2017-05-19 NOTE — ED Provider Notes (Signed)
[facility] EMERGENCY DEPARTMENT Provider Note   CSN: [id number] Arrival date & time: 05/19/17  1624     History   Chief Complaint Chief Complaint  Patient presents with  . Abnormal Lab    HPI Todd [name] is a 61 y.o. male.  HPI   61yM sent for evaluation of increasing Cr. He has no complaints. Denies swelling, dyspnea, changes in urination, fatigue, etc. Recently started on meloxicam for LE pain.   Past Medical History:  Diagnosis Date  . AKI (acute kidney injury) ([place])   . Constipated   . Diabetes mellitus without complication ([place])   . Diarrhea   . Elevated LFTs   . Goiter   . Gout   . Hyperlipidemia   . Hypertension   . Leukocytosis   . Reactive thrombocytosis   . Right BKA infection ([place]) 08/2016  . Right leg pain   . Sepsis due to undetermined organism ([place])   . Thyroid disease   . Wound infection after surgery 08/2016    Patient Active Problem List   Diagnosis Date Noted  . History of right above knee amputation ([place]) 09/21/2016  . Acquired absence of left leg below knee ([place]) 09/21/2016  . Transaminitis   . Diabetic peripheral neuropathy ([place]) 07/21/2016  . Renal failure 07/04/2016  . Hyperuricemia 06/21/2016  . HTN (hypertension) 01/05/2016  . Diabetes type 2, uncontrolled ([place]) 01/05/2016  . Hypothyroidism 05/10/2011    Past Surgical History:  Procedure Laterality Date  . ABDOMINAL AORTOGRAM N/A 08/11/2016   Procedure: Abdominal Aortogram;  Surgeon: Waynetta Sandy, MD;  Location: [facility];  Service: Cardiovascular;  Laterality: N/A;  . ABDOMINAL AORTOGRAM W/LOWER EXTREMITY N/A 08/15/2016   Procedure: Abdominal Aortogram w/Lower Extremity;  Surgeon: Elam Dutch, MD;  Location: [facility];  Service: Cardiovascular;  Laterality: N/A;  . AMPUTATION Right 08/17/2016   Procedure: RIGHT BELOW KNEE AMPUTATION;  Surgeon: Elam Dutch, MD;  Location: [facility];  Service: Vascular;  Laterality: Right;  . AMPUTATION Right  09/12/2016   Procedure: AMPUTATION ABOVE KNEE;  Surgeon: Newt Minion, MD;  Location: [facility];  Service: Orthopedics;  Laterality: Right;  . AMPUTATION Left 08/12/2016   Procedure: LEFT BELOW KNEE AMPUTATION;  Surgeon: Newt Minion, MD;  Location: [facility];  Service: Orthopedics;  Laterality: Left;  . APPLICATION OF WOUND VAC Right 09/12/2016   Procedure: APPLICATION OF WOUND VAC ABOVE KNEE;  Surgeon: Newt Minion, MD;  Location: [facility];  Service: Orthopedics;  Laterality: Right;  . LOWER EXTREMITY ANGIOGRAPHY Bilateral 08/11/2016   Procedure: Lower Extremity Angiography;  Surgeon: Waynetta Sandy, MD;  Location: [facility];  Service: Cardiovascular;  Laterality: Bilateral;  . PERIPHERAL VASCULAR BALLOON ANGIOPLASTY Left 08/11/2016   Procedure: Peripheral Vascular Balloon Angioplasty;  Surgeon: Waynetta Sandy, MD;  Location: [facility];  Service: Cardiovascular;  Laterality: Left;  SFA  . THYROID SURGERY         Home Medications    Prior to Admission medications   Medication Sig Start Date End Date Taking? Authorizing Provider  allopurinol (ZYLOPRIM) 100 MG tablet Take 1 tablet (100 mg total) by mouth daily. 05/16/17  Yes [name], MD  ASPIRIN 81 PO Take 1 tablet by mouth daily.   Yes [provider]  atorvastatin (LIPITOR) 40 MG tablet Take 1 tablet (40 mg total) by mouth daily at 6 PM. 05/16/17  Yes [name], [name], MD  carvedilol (COREG) 3.125 MG tablet Take 1 tablet (  3.125 mg total) by mouth 2 (two) times daily with a meal. Patient taking differently: Take 3.125 mg by mouth daily.  05/16/17  Yes [name], [name], MD  COLCRYS 0.6 MG tablet Take 1 tablet (0.6 mg total) by mouth 2 (two) times daily as needed. 05/16/17  Yes [name], [name], MD  Insulin Glargine (LANTUS SOLOSTAR) 100 UNIT/ML Solostar Pen Inject 10 Units into the skin daily. 08/19/16  Yes Ghimire, Henreitta Leber, MD  levothyroxine (SYNTHROID, LEVOTHROID) 75 MCG tablet Take 1 tablet (75 mcg total) by  mouth daily before breakfast. 05/17/17  Yes [name], [name], MD  loperamide (IMODIUM) 2 MG capsule Take 4 mg by mouth as needed for diarrhea or loose stools.   Yes [provider]  meloxicam (MOBIC) 15 MG tablet Take 1 tablet (15 mg total) by mouth daily. 05/16/17  Yes [name], MD  traMADol (ULTRAM) 50 MG tablet Take 1 tablet (50 mg total) by mouth every 6 (six) hours as needed for moderate pain. Patient taking differently: Take 50 mg by mouth every morning.  05/16/17  Yes [name], [name], MD  lisinopril (PRINIVIL,ZESTRIL) 40 MG tablet Take 1 tablet by mouth daily. 02/22/17   [provider]    Family History Family History  Problem Relation Age of Onset  . Heart disease Mother   . Pneumonia Father   . Diabetes Maternal Aunt   . Diabetes Maternal Uncle     Social History Social History   Tobacco Use  . Smoking status: Current Every Day Smoker    Packs/day: 0.75    Years: 45.00    Pack years: 33.75    Types: Cigarettes    Last attempt to quit: 11/12/2014    Years since quitting: 2.5  . Smokeless tobacco: Never Used  Substance Use Topics  . Alcohol use: No    Alcohol/week: 0.0 oz    Comment: h/o heavy use; stopped drinking in 7/16  . Drug use: No     Allergies   Lisinopril   Review of Systems Review of Systems All systems reviewed and negative, other than as noted in HPI.   Physical Exam Updated Vital Signs BP (!) 165/88 (BP Location: Right Arm)   Pulse 67   Temp 98.4 F (36.9 C) (Oral)   Resp 17   Wt 72.6 kg (160 lb)   SpO2 100%   BMI 24.33 kg/m   Physical Exam  Constitutional: He appears well-developed and well-nourished. No distress.  HENT:  Head: Normocephalic and atraumatic.  Eyes: Conjunctivae are normal. Right eye exhibits no discharge. Left eye exhibits no discharge.  Neck: Neck supple.  Cardiovascular: Normal rate, regular rhythm and normal heart sounds. Exam reveals no gallop and no friction rub.  No murmur  heard. Pulmonary/Chest: Effort normal and breath sounds normal. No respiratory distress.  Abdominal: Soft. He exhibits no distension. There is no tenderness.  Musculoskeletal: He exhibits no edema or tenderness.  B/l LE amps.   Neurological: He is alert.  Skin: Skin is warm and dry.  Psychiatric: He has a normal mood and affect. His behavior is normal. Thought content normal.  Nursing note and vitals reviewed.    ED Treatments / Results  Labs (all labs ordered are listed, but only abnormal results are displayed) Labs Reviewed  CBC WITH DIFFERENTIAL/PLATELET - Abnormal; Notable for the following components:      Result Value   Platelets 556 (*)    Eosinophils Absolute 1.3 (*)    All other components within normal limits  COMPREHENSIVE METABOLIC PANEL -  Abnormal; Notable for the following components:   Sodium 131 (*)    Potassium 5.7 (*)    CO2 21 (*)    BUN 28 (*)    Creatinine, Ser 2.26 (*)    Total Protein 8.9 (*)    Alkaline Phosphatase 132 (*)    GFR calc non Af Amer 29 (*)    GFR calc Af Amer 34 (*)    All other components within normal limits  URINALYSIS, ROUTINE W REFLEX MICROSCOPIC    EKG  EKG Interpretation  Date/Time:  Friday May 19 2017 21:22:11 EST Ventricular Rate:  84 PR Interval:    QRS Duration: 108 QT Interval:  388 QTC Calculation: 459 R Axis:   46 Text Interpretation:  Sinus rhythm Nonspecific repol abnormality, diffuse leads No significant change since last tracing Confirmed by Isla Pence [phone]) on 05/20/2017 11:57:37 AM       Radiology No results found.  Procedures Procedures (including critical care time)  Medications Ordered in ED Medications - No data to display   Initial Impression / Assessment and Plan / ED Course  I have reviewed the triage vital signs and the nursing notes.  Pertinent labs & imaging results that were available during my care of the patient were reviewed by me and considered in my medical decision  making (see chart for details).     61yM with worsening renal function. Not quite to point that I feel it requires hospitalization. He has no overt symptoms. K 5.7. Kayexalate given in ED.   Started on meloxicam in the beginning of September and has been taking daily. I advised him to stop and avoid other NSAIDs. Will prescribed alternative pain meds until can follow-up with PCP. Lisinopril on medication list but also listed as allergy? Pt thinks he is taking it. If so, I advised him to hold it for a few days.  Drink plenty of water. Advised that I want him to have repeat blood work agin this upcoming week to reassess.   Final Clinical Impressions(s) / ED Diagnoses   Final diagnoses:  AKI (acute kidney injury) [facility])    ED Discharge Orders    None       Virgel Manifold, MD 05/21/17 1704

## 2017-05-22 ENCOUNTER — Ambulatory Visit (INDEPENDENT_AMBULATORY_CARE_PROVIDER_SITE_OTHER): Payer: BLUE CROSS/BLUE SHIELD | Admitting: Orthopedic Surgery

## 2017-05-26 ENCOUNTER — Encounter (INDEPENDENT_AMBULATORY_CARE_PROVIDER_SITE_OTHER): Payer: Self-pay | Admitting: Family

## 2017-05-26 ENCOUNTER — Ambulatory Visit (INDEPENDENT_AMBULATORY_CARE_PROVIDER_SITE_OTHER): Payer: BLUE CROSS/BLUE SHIELD | Admitting: Family

## 2017-05-26 VITALS — Ht 68.0 in | Wt 160.0 lb

## 2017-05-26 DIAGNOSIS — Z89512 Acquired absence of left leg below knee: Secondary | ICD-10-CM | POA: Diagnosis not present

## 2017-05-26 DIAGNOSIS — E1142 Type 2 diabetes mellitus with diabetic polyneuropathy: Secondary | ICD-10-CM

## 2017-05-26 MED ORDER — CEPHALEXIN 500 MG PO CAPS: 500.0000 mg | ORAL_CAPSULE | Freq: Two times a day (BID) | ORAL | 0 refills | Status: DC

## 2017-05-26 MED ORDER — SILVER SULFADIAZINE 1 % EX CREA: 1.0000 "application " | TOPICAL_CREAM | Freq: Every day | CUTANEOUS | 0 refills | Status: DC

## 2017-05-26 NOTE — Progress Notes (Signed)
Office Visit Note   Patient: Todd [name]           Date of Birth: [birth year]           MRN: [id number] Visit Date: 05/26/2017              Requested by: [name], MD [place], [place] [address] PCP: [name], MD  Chief Complaint  Patient presents with  . Right Leg - Follow-up    AKA  . Left Leg - Follow-up    BKA      HPI: The patient is a 61 year old gentleman seen today for evaluation of ulceration and pain to L BKA. Has been working with Shirlean Mylar for gait training, is L BKA and R AKA. Has had ulcer to tibial tuberosity for 5-6 weeks.   Assessment & Plan: Visit Diagnoses: No diagnosis found.  Plan: begin silvadene dressing changes. Have provided a course of keflex as well. Minimize wear of prosthesis until healed.   Follow-Up Instructions: No Follow-up on file.   Ortho Exam  Patient is alert, oriented, no adenopathy, well-dressed, normal affect, normal respiratory effort. On examination of residual limb, incision well heAled and well consolidated. The tibial tuberosity has anterior ulcer covered with eschar is 25 x 10 mm in size, no depth. No warmth, drainage or surrounding erythema. Is tender to palpation. No palpable abscess.   Imaging: No results found. No images are attached to the encounter.  Labs: Lab Results  Component Value Date   HGBA1C 6.8 (H) 08/08/2016   HGBA1C 14.3 (H) 12/21/2015   ESRSEDRATE 138 (H) 09/08/2016   ESRSEDRATE 117 (H) 08/08/2016   CRP 7.1 (H) 09/08/2016   CRP 3.2 (H) 08/08/2016   LABURIC 9.5 (H) 11/11/2016   LABURIC 9.2 (H) 07/11/2016   LABURIC 11.4 (H) 07/04/2016   REPTSTATUS 09/13/2016 FINAL 09/08/2016   REPTSTATUS 09/13/2016 FINAL 09/08/2016   CULT NO GROWTH 5 DAYS 09/08/2016   CULT NO GROWTH 5 DAYS 09/08/2016    @LABSALLVALUES (HGBA1)@  Body mass index is 24.33 kg/m.  Orders:  No orders of the defined types were placed in this encounter.  Meds ordered this encounter  Medications  . silver  sulfADIAZINE (SILVADENE) 1 % cream    Sig: Apply 1 application topically daily.    Dispense:  50 g    Refill:  0  . cephALEXin (KEFLEX) 500 MG capsule    Sig: Take 1 capsule (500 mg total) by mouth 2 (two) times daily.    Dispense:  28 capsule    Refill:  0     Procedures: No procedures performed  Clinical Data: No additional findings.  ROS:  All other systems negative, except as noted in the HPI. Review of Systems  Constitutional: Negative for chills and fever.  Cardiovascular: Negative for leg swelling.  Skin: Positive for color change and wound.    Objective: Vital Signs: Ht 5\' 8"  (1.727 m)   Wt 160 lb (72.6 kg)   BMI 24.33 kg/m   Specialty Comments:  No specialty comments available.  PMFS History: Patient Active Problem List   Diagnosis Date Noted  . History of right above knee amputation ([place]) 09/21/2016  . Acquired absence of left leg below knee ([place]) 09/21/2016  . Transaminitis   . Diabetic peripheral neuropathy ([place]) 07/21/2016  . Renal failure 07/04/2016  . Hyperuricemia 06/21/2016  . HTN (hypertension) 01/05/2016  . Diabetes type 2, uncontrolled ([place]) 01/05/2016  . Hypothyroidism 05/10/2011   Past Medical History:  Diagnosis Date  . AKI (acute kidney injury) ([place])   . Constipated   . Diabetes mellitus without complication ([place])   . Diarrhea   . Elevated LFTs   . Goiter   . Gout   . Hyperlipidemia   . Hypertension   . Leukocytosis   . Reactive thrombocytosis   . Right BKA infection ([place]) 08/2016  . Right leg pain   . Sepsis due to undetermined organism ([place])   . Thyroid disease   . Wound infection after surgery 08/2016    Family History  Problem Relation Age of Onset  . Heart disease Mother   . Pneumonia Father   . Diabetes Maternal Aunt   . Diabetes Maternal Uncle     Past Surgical History:  Procedure Laterality Date  . ABDOMINAL AORTOGRAM N/A 08/11/2016   Procedure: Abdominal Aortogram;  Surgeon: Waynetta Sandy, MD;   Location: [facility];  Service: Cardiovascular;  Laterality: N/A;  . ABDOMINAL AORTOGRAM W/LOWER EXTREMITY N/A 08/15/2016   Procedure: Abdominal Aortogram w/Lower Extremity;  Surgeon: Elam Dutch, MD;  Location: [facility];  Service: Cardiovascular;  Laterality: N/A;  . AMPUTATION Right 08/17/2016   Procedure: RIGHT BELOW KNEE AMPUTATION;  Surgeon: Elam Dutch, MD;  Location: [facility];  Service: Vascular;  Laterality: Right;  . AMPUTATION Right 09/12/2016   Procedure: AMPUTATION ABOVE KNEE;  Surgeon: Newt Minion, MD;  Location: [facility];  Service: Orthopedics;  Laterality: Right;  . AMPUTATION Left 08/12/2016   Procedure: LEFT BELOW KNEE AMPUTATION;  Surgeon: Newt Minion, MD;  Location: [facility];  Service: Orthopedics;  Laterality: Left;  . APPLICATION OF WOUND VAC Right 09/12/2016   Procedure: APPLICATION OF WOUND VAC ABOVE KNEE;  Surgeon: Newt Minion, MD;  Location: [facility];  Service: Orthopedics;  Laterality: Right;  . LOWER EXTREMITY ANGIOGRAPHY Bilateral 08/11/2016   Procedure: Lower Extremity Angiography;  Surgeon: Waynetta Sandy, MD;  Location: [facility];  Service: Cardiovascular;  Laterality: Bilateral;  . PERIPHERAL VASCULAR BALLOON ANGIOPLASTY Left 08/11/2016   Procedure: Peripheral Vascular Balloon Angioplasty;  Surgeon: Waynetta Sandy, MD;  Location: [facility];  Service: Cardiovascular;  Laterality: Left;  SFA  . THYROID SURGERY     Social History   Occupational History  . Occupation: drives a Forensic scientist  Tobacco Use  . Smoking status: Current Every Day Smoker    Packs/day: 0.75    Years: 45.00    Pack years: 33.75    Types: Cigarettes    Last attempt to quit: 11/12/2014    Years since quitting: 2.5  . Smokeless tobacco: Never Used  Substance and Sexual Activity  . Alcohol use: No    Alcohol/week: 0.0 oz    Comment: h/o heavy use; stopped drinking in 7/16  . Drug use: No  . Sexual activity: Not Currently

## 2017-05-28 ENCOUNTER — Other Ambulatory Visit: Payer: Self-pay | Admitting: Family Medicine

## 2017-05-29 ENCOUNTER — Other Ambulatory Visit: Payer: Self-pay | Admitting: *Deleted

## 2017-05-29 ENCOUNTER — Other Ambulatory Visit: Payer: Self-pay | Admitting: Family Medicine

## 2017-05-29 ENCOUNTER — Telehealth: Payer: Self-pay | Admitting: Family Medicine

## 2017-05-29 MED ORDER — BACITRACIN 500 UNIT/GM EX OINT
1.0000 | TOPICAL_OINTMENT | Freq: Two times a day (BID) | CUTANEOUS | 0 refills | Status: DC
Start: 2017-05-29 — End: 2017-07-28

## 2017-05-29 MED ORDER — MEGESTROL ACETATE 400 MG/10ML PO SUSP: 400.0000 mg | Freq: Two times a day (BID) | ORAL | 2 refills | Status: DC

## 2017-05-29 NOTE — Telephone Encounter (Signed)
Please advise on medications

## 2017-05-29 NOTE — Telephone Encounter (Signed)
What symptoms do you have? Want eat and needs something to help him eat  How long have you been sick? 2 weeks  Have you been seen for this problem? NO  If your provider decides to give you a prescription, which pharmacy would you like for it to be sent to? [facility]in [place]   Patient informed that this information will be sent to the clinical staff for review and that they should receive a follow up call.

## 2017-05-29 NOTE — Telephone Encounter (Signed)
I sent in megace. Marinol is illegal in [place] okay to send in bacitracin, although it is OTC.

## 2017-05-29 NOTE — Telephone Encounter (Signed)
Aware of scripts sent to pharmacy.

## 2017-06-01 ENCOUNTER — Ambulatory Visit (INDEPENDENT_AMBULATORY_CARE_PROVIDER_SITE_OTHER): Payer: BLUE CROSS/BLUE SHIELD | Admitting: Orthopedic Surgery

## 2017-06-16 ENCOUNTER — Ambulatory Visit (INDEPENDENT_AMBULATORY_CARE_PROVIDER_SITE_OTHER): Payer: BLUE CROSS/BLUE SHIELD | Admitting: Family

## 2017-06-16 ENCOUNTER — Encounter (INDEPENDENT_AMBULATORY_CARE_PROVIDER_SITE_OTHER): Payer: Self-pay | Admitting: Family

## 2017-06-16 DIAGNOSIS — Z89512 Acquired absence of left leg below knee: Secondary | ICD-10-CM

## 2017-06-16 NOTE — Progress Notes (Signed)
Office Visit Note   Patient: Todd [name]           Date of Birth: [birth year]           MRN: [id number] Visit Date: 06/16/2017              Requested by: [name], MD [place], [place] [address] PCP: [name], MD  Chief Complaint  Patient presents with  . Left Leg - Pain      HPI: The patient is a 62 year old gentleman seen today for evaluation of ulceration and pain to L BKA. Has had ulcer to tibial tuberosity for a few months now.   Assessment & Plan: Visit Diagnoses:  1. Acquired absence of left leg below knee (HCC)     Plan: Continue silvadene dressing changes. Iodosorb dressing applied today. Minimize wear of prosthesis until healed.   Follow-Up Instructions: Return in about 3 weeks (around 07/07/2017).   Ortho Exam  Patient is alert, oriented, no adenopathy, well-dressed, normal affect, normal respiratory effort. On examination of residual limb, incision well heAled and well consolidated. The tibial tuberosity has anterior ulcer covered with eschar is 4 x 2.5 cm in size, no depth. No warmth, drainage or surrounding erythema. Is tender to palpation. No palpable abscess.   Imaging: No results found. No images are attached to the encounter.  Labs: Lab Results  Component Value Date   HGBA1C 6.8 (H) 08/08/2016   HGBA1C 14.3 (H) 12/21/2015   ESRSEDRATE 138 (H) 09/08/2016   ESRSEDRATE 117 (H) 08/08/2016   CRP 7.1 (H) 09/08/2016   CRP 3.2 (H) 08/08/2016   LABURIC 9.5 (H) 11/11/2016   LABURIC 9.2 (H) 07/11/2016   LABURIC 11.4 (H) 07/04/2016   REPTSTATUS 09/13/2016 FINAL 09/08/2016   REPTSTATUS 09/13/2016 FINAL 09/08/2016   CULT NO GROWTH 5 DAYS 09/08/2016   CULT NO GROWTH 5 DAYS 09/08/2016    @LABSALLVALUES (HGBA1)@  There is no height or weight on file to calculate BMI.  Orders:  No orders of the defined types were placed in this encounter.  No orders of the defined types were placed in this encounter.    Procedures: No  procedures performed  Clinical Data: No additional findings.  ROS:  All other systems negative, except as noted in the HPI. Review of Systems  Constitutional: Negative for chills and fever.  Cardiovascular: Negative for leg swelling.  Skin: Positive for color change and wound.    Objective: Vital Signs: There were no vitals taken for this visit.  Specialty Comments:  No specialty comments available.  PMFS History: Patient Active Problem List   Diagnosis Date Noted  . History of right above knee amputation ([place]) 09/21/2016  . Acquired absence of left leg below knee ([place]) 09/21/2016  . Transaminitis   . Diabetic peripheral neuropathy ([place]) 07/21/2016  . Renal failure 07/04/2016  . Hyperuricemia 06/21/2016  . HTN (hypertension) 01/05/2016  . Diabetes type 2, uncontrolled ([place]) 01/05/2016  . Hypothyroidism 05/10/2011   Past Medical History:  Diagnosis Date  . AKI (acute kidney injury) ([place])   . Constipated   . Diabetes mellitus without complication ([place])   . Diarrhea   . Elevated LFTs   . Goiter   . Gout   . Hyperlipidemia   . Hypertension   . Leukocytosis   . Reactive thrombocytosis   . Right BKA infection ([place]) 08/2016  . Right leg pain   . Sepsis due to undetermined organism ([place])   . Thyroid disease   .  Wound infection after surgery 08/2016    Family History  Problem Relation Age of Onset  . Heart disease Mother   . Pneumonia Father   . Diabetes Maternal Aunt   . Diabetes Maternal Uncle     Past Surgical History:  Procedure Laterality Date  . ABDOMINAL AORTOGRAM N/A 08/11/2016   Procedure: Abdominal Aortogram;  Surgeon: Waynetta Sandy, MD;  Location: [facility];  Service: Cardiovascular;  Laterality: N/A;  . ABDOMINAL AORTOGRAM W/LOWER EXTREMITY N/A 08/15/2016   Procedure: Abdominal Aortogram w/Lower Extremity;  Surgeon: Elam Dutch, MD;  Location: [facility];  Service: Cardiovascular;  Laterality: N/A;  . AMPUTATION Right  08/17/2016   Procedure: RIGHT BELOW KNEE AMPUTATION;  Surgeon: Elam Dutch, MD;  Location: [facility];  Service: Vascular;  Laterality: Right;  . AMPUTATION Right 09/12/2016   Procedure: AMPUTATION ABOVE KNEE;  Surgeon: Newt Minion, MD;  Location: [facility];  Service: Orthopedics;  Laterality: Right;  . AMPUTATION Left 08/12/2016   Procedure: LEFT BELOW KNEE AMPUTATION;  Surgeon: Newt Minion, MD;  Location: [facility];  Service: Orthopedics;  Laterality: Left;  . APPLICATION OF WOUND VAC Right 09/12/2016   Procedure: APPLICATION OF WOUND VAC ABOVE KNEE;  Surgeon: Newt Minion, MD;  Location: [facility];  Service: Orthopedics;  Laterality: Right;  . LOWER EXTREMITY ANGIOGRAPHY Bilateral 08/11/2016   Procedure: Lower Extremity Angiography;  Surgeon: Waynetta Sandy, MD;  Location: [facility];  Service: Cardiovascular;  Laterality: Bilateral;  . PERIPHERAL VASCULAR BALLOON ANGIOPLASTY Left 08/11/2016   Procedure: Peripheral Vascular Balloon Angioplasty;  Surgeon: Waynetta Sandy, MD;  Location: [facility];  Service: Cardiovascular;  Laterality: Left;  SFA  . THYROID SURGERY     Social History   Occupational History  . Occupation: drives a Forensic scientist  Tobacco Use  . Smoking status: Current Every Day Smoker    Packs/day: 0.75    Years: 45.00    Pack years: 33.75    Types: Cigarettes    Last attempt to quit: 11/12/2014    Years since quitting: 2.5  . Smokeless tobacco: Never Used  Substance and Sexual Activity  . Alcohol use: No    Alcohol/week: 0.0 oz    Comment: h/o heavy use; stopped drinking in 7/16  . Drug use: No  . Sexual activity: Not Currently

## 2017-07-07 ENCOUNTER — Telehealth (INDEPENDENT_AMBULATORY_CARE_PROVIDER_SITE_OTHER): Payer: Self-pay | Admitting: Family

## 2017-07-07 ENCOUNTER — Encounter (INDEPENDENT_AMBULATORY_CARE_PROVIDER_SITE_OTHER): Payer: Self-pay | Admitting: Family

## 2017-07-07 ENCOUNTER — Ambulatory Visit (INDEPENDENT_AMBULATORY_CARE_PROVIDER_SITE_OTHER): Payer: BLUE CROSS/BLUE SHIELD | Admitting: Family

## 2017-07-07 VITALS — Ht 68.0 in | Wt 160.0 lb

## 2017-07-07 DIAGNOSIS — Z89512 Acquired absence of left leg below knee: Secondary | ICD-10-CM | POA: Diagnosis not present

## 2017-07-07 NOTE — Progress Notes (Signed)
Office Visit Note   Patient: Todd [name]           Date of Birth: [birth year]           MRN: [id number] Visit Date: 07/07/2017              Requested by: [name], MD [place], [place] [id number] PCP: [name], MD  Chief Complaint  Patient presents with  . Left Leg - Follow-up    Left BKA ulcer tibial tuberosity       HPI: The patient is a 62 year old gentleman seen today for evaluation of ulceration and pain to L BKA. Has had ulcer to tibial tuberosity for a few months now. Has been doing Mupirocin dressing changes.   Assessment & Plan: Visit Diagnoses:  1. Acquired absence of left leg below knee (HCC)     Plan: Continue dressing changes daily. Apply Iodosorb, given a tube today. Iodosorb dressing applied today. Minimize wear of prosthesis until healed.   Follow-Up Instructions: Return in about 3 weeks (around 07/28/2017).   Ortho Exam  Patient is alert, oriented, no adenopathy, well-dressed, normal affect, normal respiratory effort. On examination of residual limb, incision well heAled and well consolidated. The tibial tuberosity has anterior ulcer covered with eschar is stable in size, again is 4 x 2.5 cm in size, no depth. Debrided of eschar and nonviable tissue. No warmth, drainage or surrounding erythema. Is tender to palpation. No palpable abscess.   Imaging: No results found. No images are attached to the encounter.  Labs: Lab Results  Component Value Date   HGBA1C 6.8 (H) 08/08/2016   HGBA1C 14.3 (H) 12/21/2015   ESRSEDRATE 138 (H) 09/08/2016   ESRSEDRATE 117 (H) 08/08/2016   CRP 7.1 (H) 09/08/2016   CRP 3.2 (H) 08/08/2016   LABURIC 9.5 (H) 11/11/2016   LABURIC 9.2 (H) 07/11/2016   LABURIC 11.4 (H) 07/04/2016   REPTSTATUS 09/13/2016 FINAL 09/08/2016   REPTSTATUS 09/13/2016 FINAL 09/08/2016   CULT NO GROWTH 5 DAYS 09/08/2016   CULT NO GROWTH 5 DAYS 09/08/2016    @LABSALLVALUES (HGBA1)@  Body mass index is 24.33  kg/m.  Orders:  No orders of the defined types were placed in this encounter.  No orders of the defined types were placed in this encounter.    Procedures: No procedures performed  Clinical Data: No additional findings.  ROS:  All other systems negative, except as noted in the HPI. Review of Systems  Constitutional: Negative for chills and fever.  Cardiovascular: Negative for leg swelling.  Skin: Positive for color change and wound.    Objective: Vital Signs: Ht 5\' 8"  (1.727 m)   Wt 160 lb (72.6 kg)   BMI 24.33 kg/m   Specialty Comments:  No specialty comments available.  PMFS History: Patient Active Problem List   Diagnosis Date Noted  . History of right above knee amputation ([place]) 09/21/2016  . Acquired absence of left leg below knee ([place]) 09/21/2016  . Transaminitis   . Diabetic peripheral neuropathy ([place]) 07/21/2016  . Renal failure 07/04/2016  . Hyperuricemia 06/21/2016  . HTN (hypertension) 01/05/2016  . Diabetes type 2, uncontrolled ([place]) 01/05/2016  . Hypothyroidism 05/10/2011   Past Medical History:  Diagnosis Date  . AKI (acute kidney injury) ([place] [place])   . Constipated   . Diabetes mellitus without complication ([place])   . Diarrhea   . Elevated LFTs   . Goiter   . Gout   . Hyperlipidemia   . Hypertension   .  Leukocytosis   . Reactive thrombocytosis   . Right BKA infection ([place]) 08/2016  . Right leg pain   . Sepsis due to undetermined organism ([place])   . Thyroid disease   . Wound infection after surgery 08/2016    Family History  Problem Relation Age of Onset  . Heart disease Mother   . Pneumonia Father   . Diabetes Maternal Aunt   . Diabetes Maternal Uncle     Past Surgical History:  Procedure Laterality Date  . ABDOMINAL AORTOGRAM N/A 08/11/2016   Procedure: Abdominal Aortogram;  Surgeon: Waynetta Sandy, MD;  Location: [facility];  Service: Cardiovascular;  Laterality: N/A;  . ABDOMINAL AORTOGRAM W/LOWER EXTREMITY N/A  08/15/2016   Procedure: Abdominal Aortogram w/Lower Extremity;  Surgeon: Elam Dutch, MD;  Location: [facility];  Service: Cardiovascular;  Laterality: N/A;  . AMPUTATION Right 08/17/2016   Procedure: RIGHT BELOW KNEE AMPUTATION;  Surgeon: Elam Dutch, MD;  Location: [facility];  Service: Vascular;  Laterality: Right;  . AMPUTATION Right 09/12/2016   Procedure: AMPUTATION ABOVE KNEE;  Surgeon: Newt Minion, MD;  Location: [facility];  Service: Orthopedics;  Laterality: Right;  . AMPUTATION Left 08/12/2016   Procedure: LEFT BELOW KNEE AMPUTATION;  Surgeon: Newt Minion, MD;  Location: [facility];  Service: Orthopedics;  Laterality: Left;  . APPLICATION OF WOUND VAC Right 09/12/2016   Procedure: APPLICATION OF WOUND VAC ABOVE KNEE;  Surgeon: Newt Minion, MD;  Location: [facility];  Service: Orthopedics;  Laterality: Right;  . LOWER EXTREMITY ANGIOGRAPHY Bilateral 08/11/2016   Procedure: Lower Extremity Angiography;  Surgeon: Waynetta Sandy, MD;  Location: [facility];  Service: Cardiovascular;  Laterality: Bilateral;  . PERIPHERAL VASCULAR BALLOON ANGIOPLASTY Left 08/11/2016   Procedure: Peripheral Vascular Balloon Angioplasty;  Surgeon: Waynetta Sandy, MD;  Location: [facility];  Service: Cardiovascular;  Laterality: Left;  SFA  . THYROID SURGERY     Social History   Occupational History  . Occupation: drives a Forensic scientist  Tobacco Use  . Smoking status: Current Every Day Smoker    Packs/day: 0.75    Years: 45.00    Pack years: 33.75    Types: Cigarettes    Last attempt to quit: 11/12/2014    Years since quitting: 2.6  . Smokeless tobacco: Never Used  Substance and Sexual Activity  . Alcohol use: No    Alcohol/week: 0.0 oz    Comment: h/o heavy use; stopped drinking in 7/16  . Drug use: No  . Sexual activity: Not Currently

## 2017-07-07 NOTE — Telephone Encounter (Signed)
I called and spoke with [name], she is very concerned about her brother. He is smoking still and drinking alcohol, advised that this certainly doesn't speed up the healing process. Unfortunately we cannot force patient to stop smoking. He is diabetic which also slows down the healing process. Advised that wound is looking the same, wound care was changed today to iodosorb and patient was given sample to take home to use daily until he sees [place] back. Advised that for now we are holding on physical therapy. She would like to be called about future appointments.

## 2017-07-07 NOTE — Telephone Encounter (Signed)
Patient's sister [name]) called asked for a call back concerning patient's appointment today. She is concerned about his leg. The number to contact [name] is [phone]

## 2017-07-11 ENCOUNTER — Ambulatory Visit: Payer: BLUE CROSS/BLUE SHIELD | Admitting: Physical Therapy

## 2017-07-13 ENCOUNTER — Encounter: Payer: Self-pay | Admitting: Physical Therapy

## 2017-07-16 IMAGING — DX DG KNEE 1-2V*R*
3 series · 3 of 3 positions shown · non-contrast
Comparison: Right knee images March 04, 2014.

CLINICAL DATA: Recent BKA.  Signs of infection now.

EXAM:
RIGHT KNEE - 1-2 VIEW

[x knee lat right]
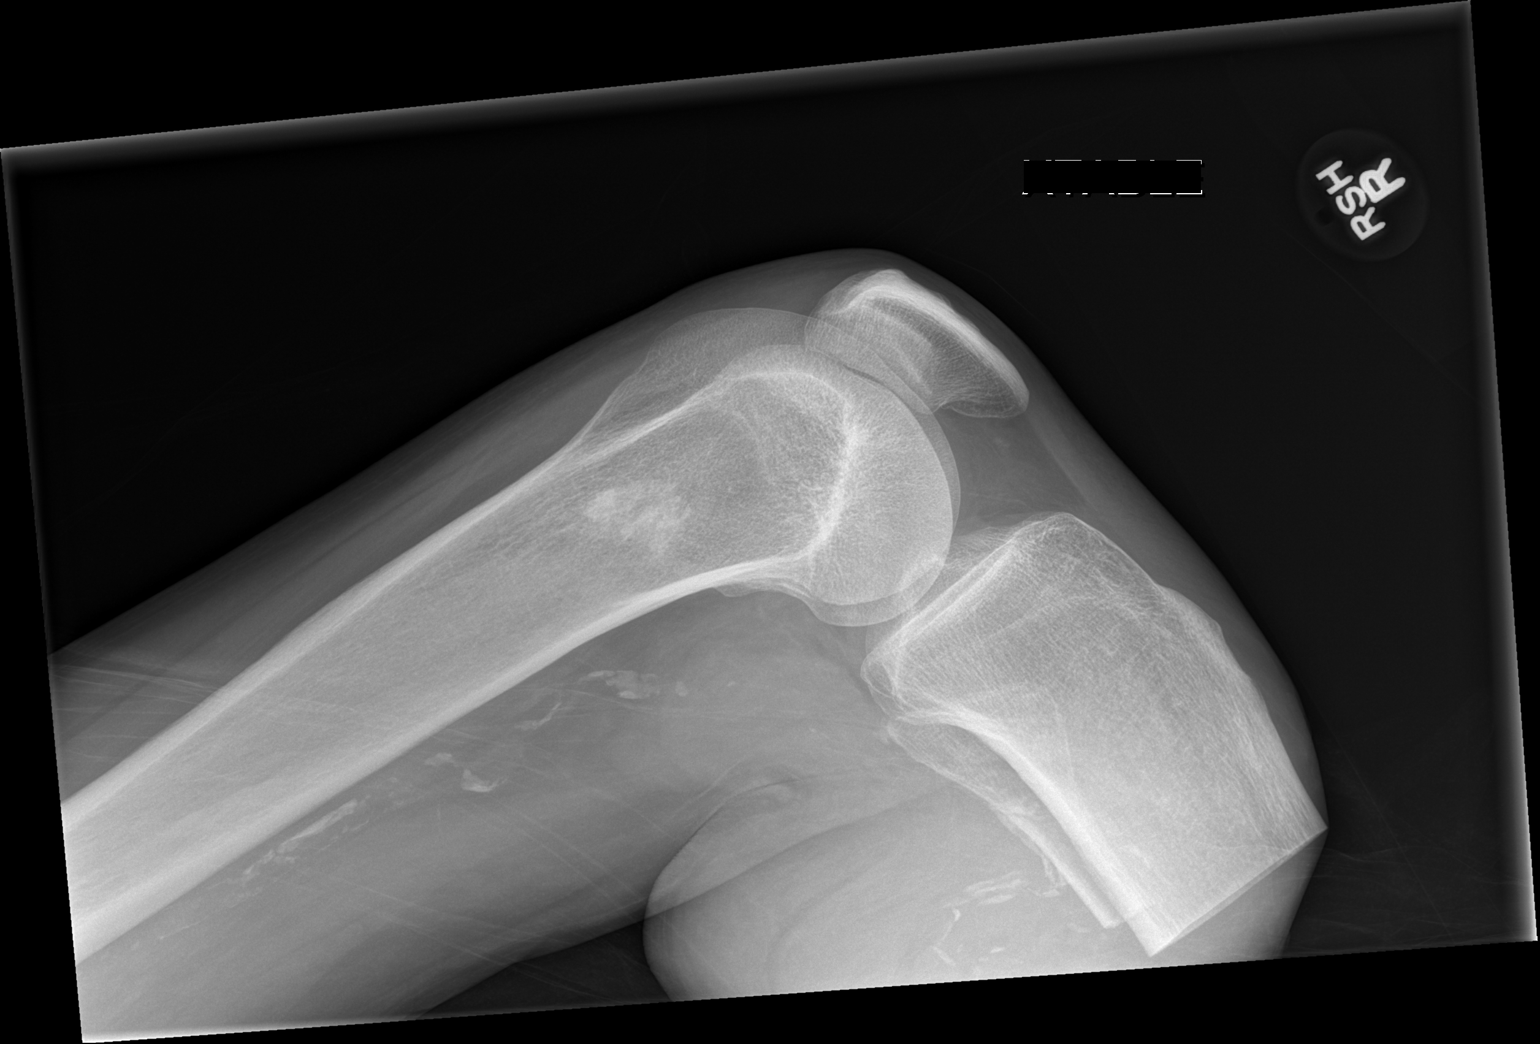

[x knee ap right (1 of 2)]
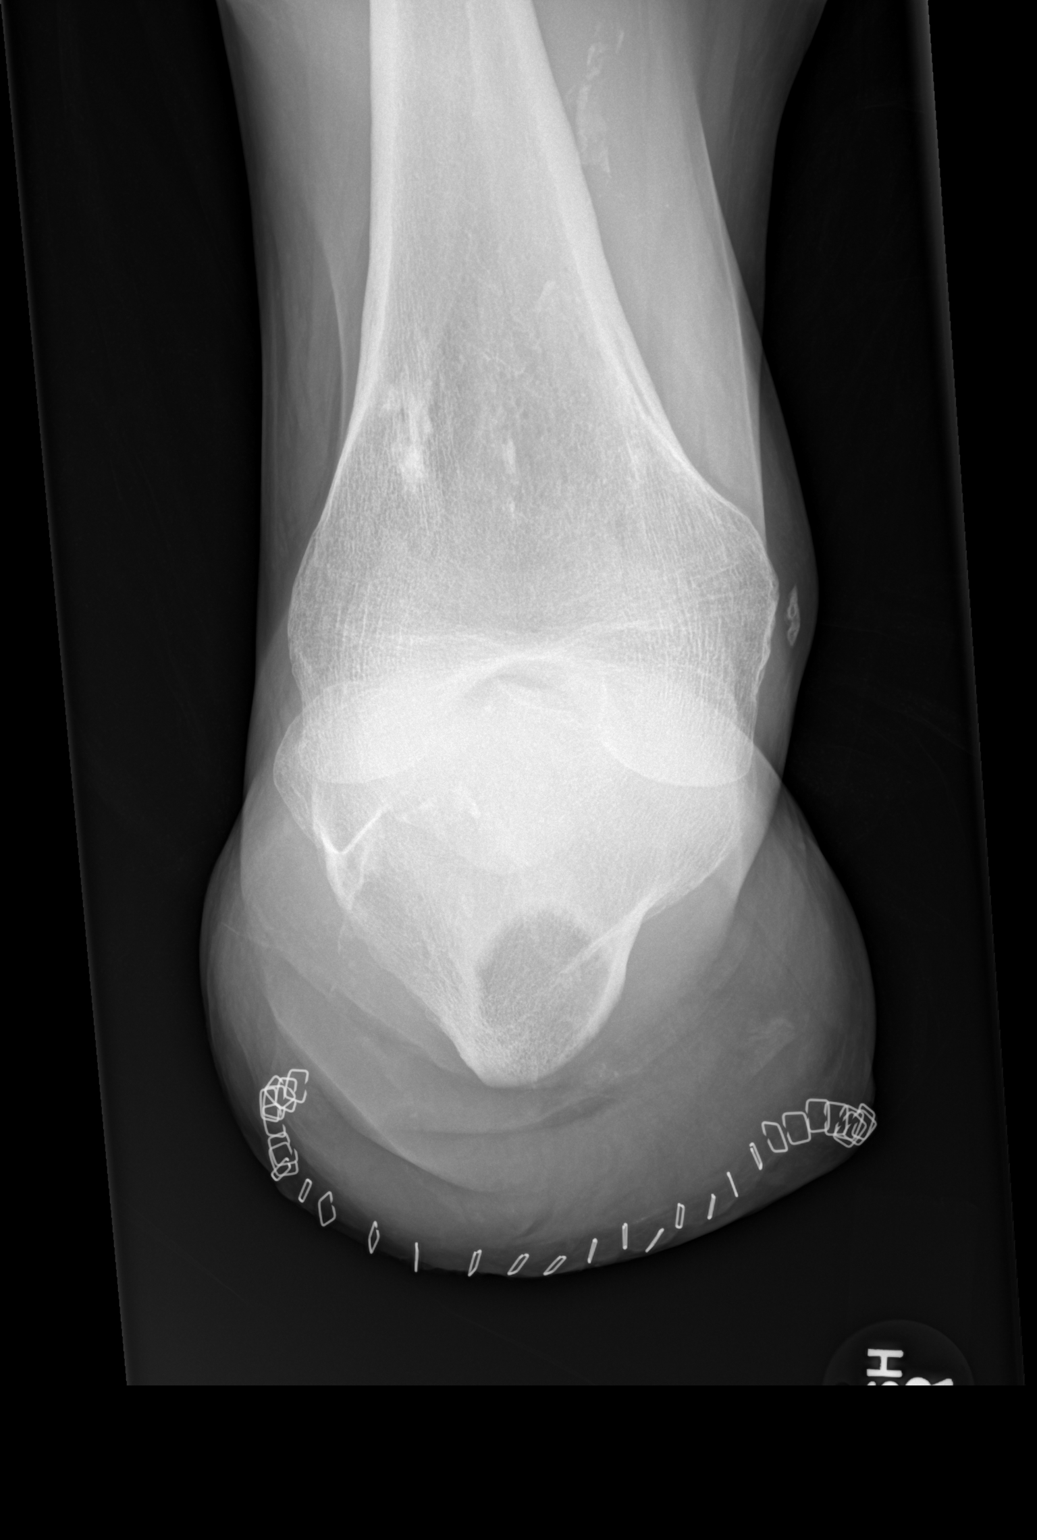

[x knee ap right (2 of 2)]
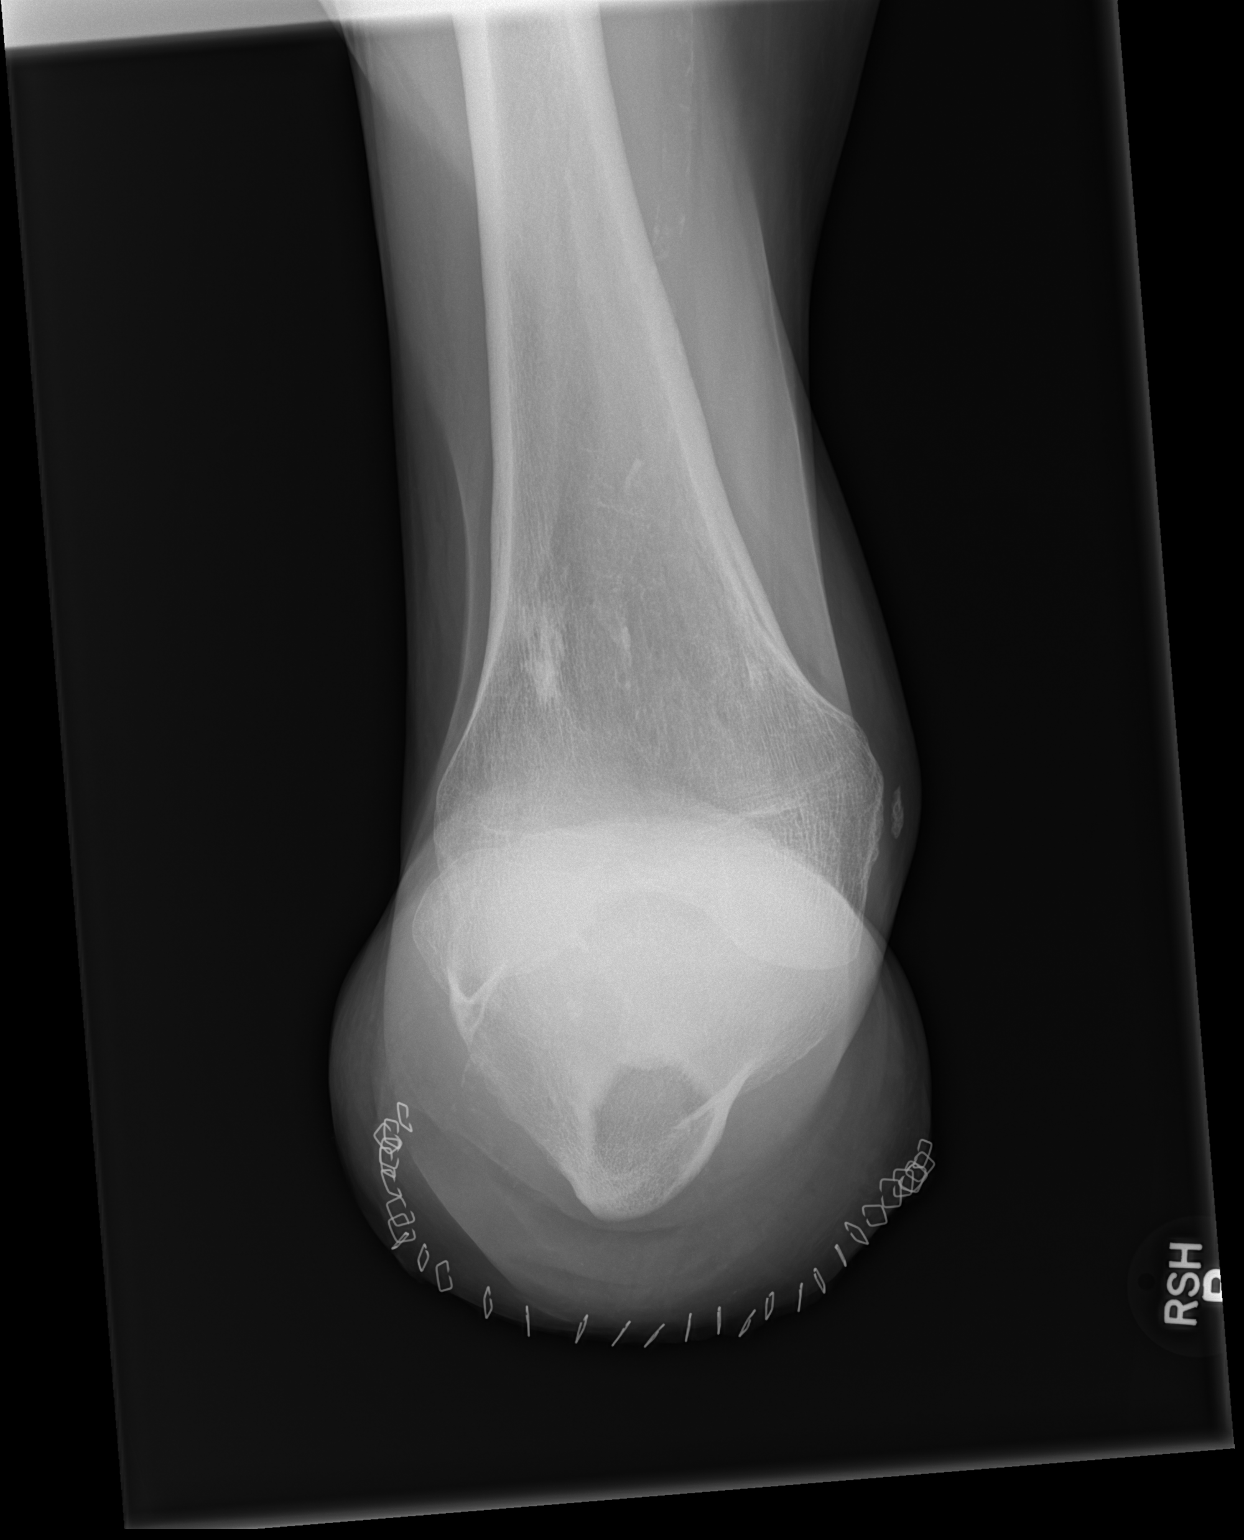

[3 of 3 positions shown; findings below may reference images not displayed]

FINDINGS: The patient has undergone below-the-knee amputation. The surgical
bony margins appear sharp. There is soft tissue swelling over the
stump. Surgical skin staples are present. No definite gas
collections are observed.
IMPRESSION: No objective evidence of osteomyelitis is observed.

## 2017-07-16 IMAGING — CR DG ABDOMEN 1V
1 series · 1 of 1 positions shown · non-contrast
Comparison: None.

CLINICAL DATA: Constipation and diarrhea for several days

EXAM:
ABDOMEN - 1 VIEW

[abdomen kub]
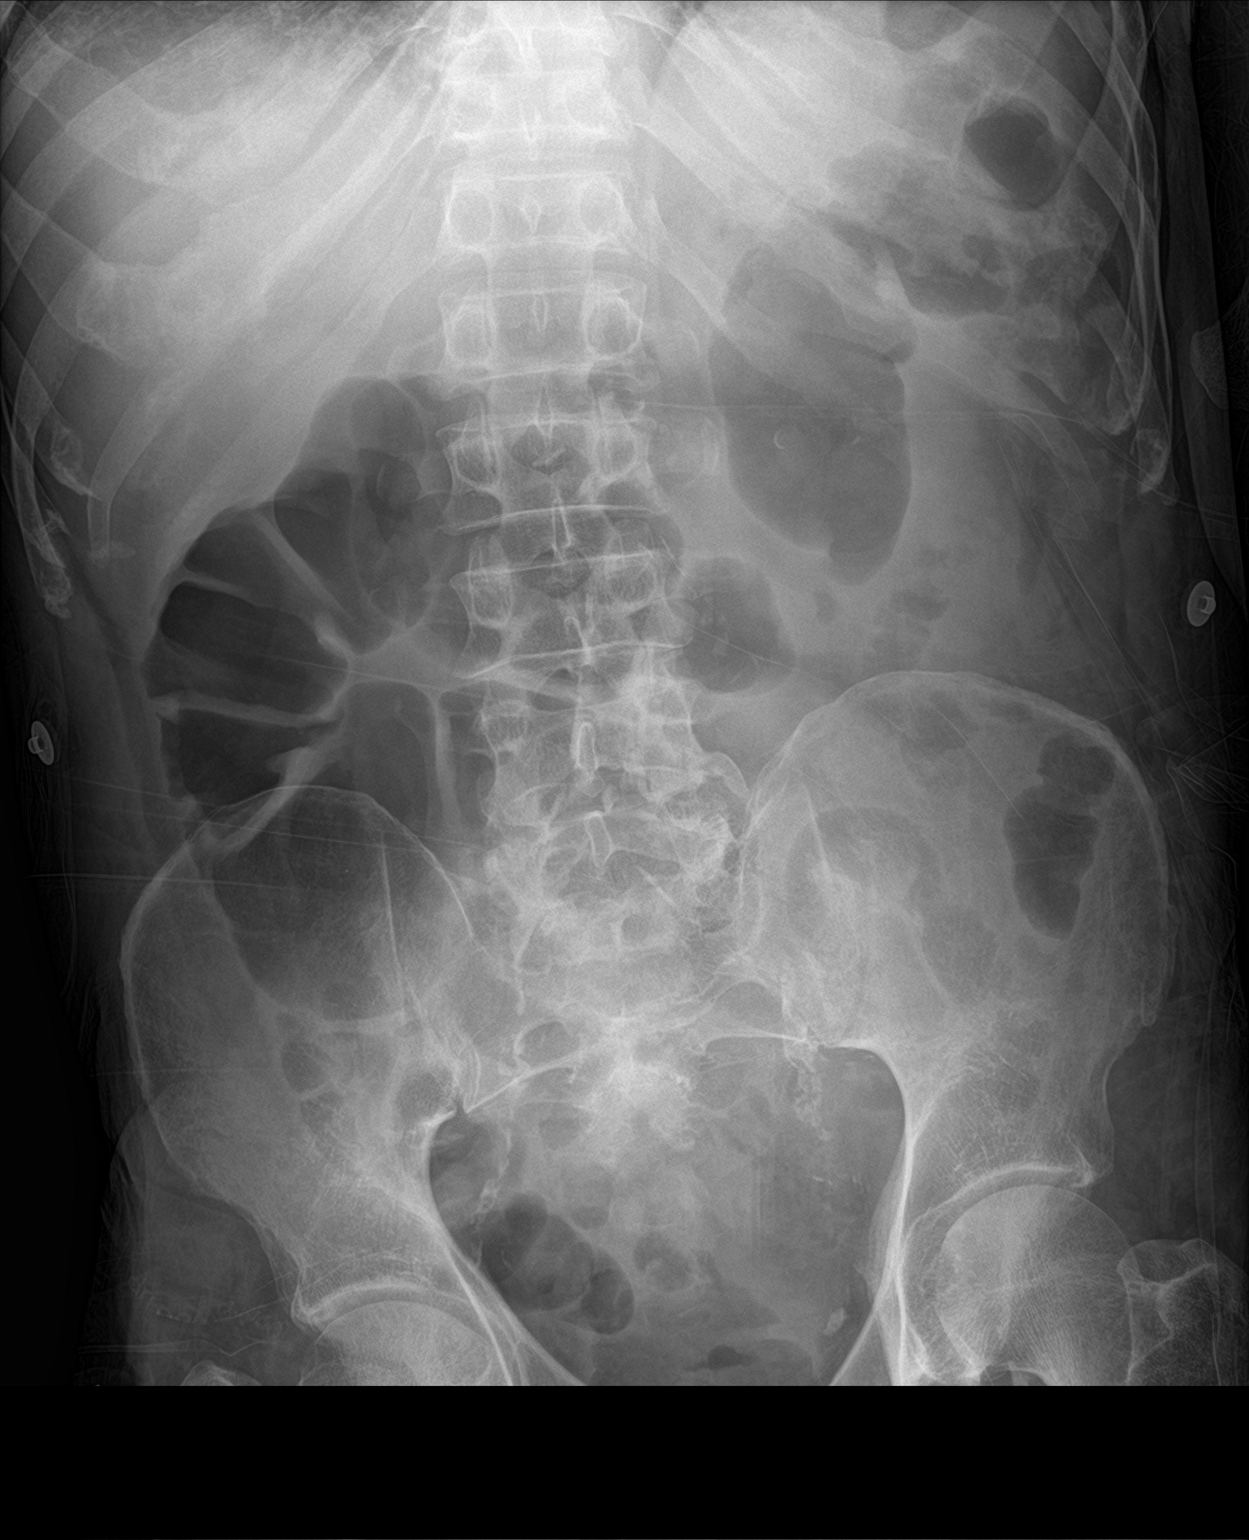

[1 of 1 positions shown; findings below may reference images not displayed]

FINDINGS: No abnormal stool retention. There is a moderate volume of colonic
gas mainly seen from the ascending segment and distal transverse
segment. No evidence of small bowel obstruction. Atherosclerotic
calcification. No concerning mass effect. Density over the right
flank at the level of L3-4 is indeterminate on this single film.
Lower lumbar facet arthropathy. Prominent atherosclerosis.
IMPRESSION: 1. Nonobstructive bowel gas pattern. Moderate colonic gas. No
abnormal stool retention.
2. Prominent atherosclerotic calcification.

## 2017-07-18 ENCOUNTER — Telehealth: Payer: Self-pay | Admitting: *Deleted

## 2017-07-18 ENCOUNTER — Encounter: Payer: Self-pay | Admitting: Physical Therapy

## 2017-07-18 NOTE — Telephone Encounter (Signed)
Rx put on Dr. Livia Snellen desk for signature

## 2017-07-20 ENCOUNTER — Encounter: Payer: Self-pay | Admitting: Physical Therapy

## 2017-07-28 ENCOUNTER — Encounter (INDEPENDENT_AMBULATORY_CARE_PROVIDER_SITE_OTHER): Payer: Self-pay | Admitting: Family

## 2017-07-28 ENCOUNTER — Ambulatory Visit (INDEPENDENT_AMBULATORY_CARE_PROVIDER_SITE_OTHER): Payer: BLUE CROSS/BLUE SHIELD | Admitting: Family

## 2017-07-28 VITALS — Ht 68.0 in | Wt 160.0 lb

## 2017-07-28 DIAGNOSIS — Z89512 Acquired absence of left leg below knee: Secondary | ICD-10-CM | POA: Diagnosis not present

## 2017-07-28 DIAGNOSIS — E11622 Type 2 diabetes mellitus with other skin ulcer: Secondary | ICD-10-CM | POA: Diagnosis not present

## 2017-07-28 DIAGNOSIS — L98499 Non-pressure chronic ulcer of skin of other sites with unspecified severity: Secondary | ICD-10-CM | POA: Insufficient documentation

## 2017-07-28 DIAGNOSIS — L97901 Non-pressure chronic ulcer of unspecified part of unspecified lower leg limited to breakdown of skin: Secondary | ICD-10-CM | POA: Diagnosis not present

## 2017-07-28 MED ORDER — BACITRACIN 500 UNIT/GM EX OINT: 1.0000 "application " | TOPICAL_OINTMENT | Freq: Every day | CUTANEOUS | 3 refills | Status: DC

## 2017-07-28 NOTE — Progress Notes (Signed)
Office Visit Note   Patient: Todd [name]           Date of Birth: 06/04/1956           MRN: [id number] Visit Date: 07/28/2017              Requested by: [name], MD [place], [place] [address] PCP: [name], MD  Chief Complaint  Patient presents with  . Left Leg - Follow-up    BKA ulcer tibial tubercle       HPI: The patient is a 62 year old gentleman seen today for evaluation of ulceration and pain to L BKA. Has had ulcer to tibial tuberosity for a few months now. Has been doing iodosorb dressing changes. Just recently ran out of iodosorb.  Assessment & Plan: Visit Diagnoses:  1. Diabetic ulcer of lower leg, limited to breakdown of skin ([place])   2. Acquired absence of left leg below knee (HCC)     Plan: Continue dressing changes daily. Apply Mupirocin, has this at home. Minimize wear of prosthesis until healed. Robin with PT aware he is holding on therapy. Will see Dr. Sharol Given back in 4 weeks.   Follow-Up Instructions: Return in about 4 weeks (around 08/25/2017).   Ortho Exam  Patient is alert, oriented, no adenopathy, well-dressed, normal affect, normal respiratory effort. On examination of residual limb, incision well healed and well consolidated. The tibial tuberosity has anterior ulcer covered which is now much improved in appearance. Minimal fibrinous tissue after debriding with gauze, granulation tissue in majority of wound bed. Wound is 40 mm x 37 mm. Minimal serous drainage. no surrounding erythema. Is tender to palpation. No palpable abscess.   Imaging: No results found. No images are attached to the encounter.  Labs: Lab Results  Component Value Date   HGBA1C 6.8 (H) 08/08/2016   HGBA1C 14.3 (H) 12/21/2015   ESRSEDRATE 138 (H) 09/08/2016   ESRSEDRATE 117 (H) 08/08/2016   CRP 7.1 (H) 09/08/2016   CRP 3.2 (H) 08/08/2016   LABURIC 9.5 (H) 11/11/2016   LABURIC 9.2 (H) 07/11/2016   LABURIC 11.4 (H) 07/04/2016   REPTSTATUS 09/13/2016  FINAL 09/08/2016   REPTSTATUS 09/13/2016 FINAL 09/08/2016   CULT NO GROWTH 5 DAYS 09/08/2016   CULT NO GROWTH 5 DAYS 09/08/2016    @LABSALLVALUES (HGBA1)@  Body mass index is 24.33 kg/m.  Orders:  No orders of the defined types were placed in this encounter.  No orders of the defined types were placed in this encounter.    Procedures: No procedures performed  Clinical Data: No additional findings.  ROS:  All other systems negative, except as noted in the HPI. Review of Systems  Constitutional: Negative for chills and fever.  Cardiovascular: Negative for leg swelling.  Skin: Positive for color change and wound.    Objective: Vital Signs: Ht 5\' 8"  (1.727 m)   Wt 160 lb (72.6 kg)   BMI 24.33 kg/m   Specialty Comments:  No specialty comments available.  PMFS History: Patient Active Problem List   Diagnosis Date Noted  . Diabetic ulcer of lower leg, limited to breakdown of skin ([place]) 07/28/2017  . History of right above knee amputation ([place]) 09/21/2016  . Acquired absence of left leg below knee ([place]) 09/21/2016  . Transaminitis   . Diabetic peripheral neuropathy ([place]) 07/21/2016  . Renal failure 07/04/2016  . Hyperuricemia 06/21/2016  . HTN (hypertension) 01/05/2016  . Diabetes type 2, uncontrolled ([place]) 01/05/2016  . Hypothyroidism 05/10/2011  Past Medical History:  Diagnosis Date  . AKI (acute kidney injury) ([place])   . Constipated   . Diabetes mellitus without complication ([place])   . Diarrhea   . Elevated LFTs   . Goiter   . Gout   . Hyperlipidemia   . Hypertension   . Leukocytosis   . Reactive thrombocytosis   . Right BKA infection ([place]) 08/2016  . Right leg pain   . Sepsis due to undetermined organism ([place])   . Thyroid disease   . Wound infection after surgery 08/2016    Family History  Problem Relation Age of Onset  . Heart disease Mother   . Pneumonia Father   . Diabetes Maternal Aunt   . Diabetes Maternal Uncle     Past Surgical  History:  Procedure Laterality Date  . ABDOMINAL AORTOGRAM N/A 08/11/2016   Procedure: Abdominal Aortogram;  Surgeon: Waynetta Sandy, MD;  Location: [facility];  Service: Cardiovascular;  Laterality: N/A;  . ABDOMINAL AORTOGRAM W/LOWER EXTREMITY N/A 08/15/2016   Procedure: Abdominal Aortogram w/Lower Extremity;  Surgeon: Elam Dutch, MD;  Location: [facility];  Service: Cardiovascular;  Laterality: N/A;  . AMPUTATION Right 08/17/2016   Procedure: RIGHT BELOW KNEE AMPUTATION;  Surgeon: Elam Dutch, MD;  Location: [facility];  Service: Vascular;  Laterality: Right;  . AMPUTATION Right 09/12/2016   Procedure: AMPUTATION ABOVE KNEE;  Surgeon: Newt Minion, MD;  Location: [facility];  Service: Orthopedics;  Laterality: Right;  . AMPUTATION Left 08/12/2016   Procedure: LEFT BELOW KNEE AMPUTATION;  Surgeon: Newt Minion, MD;  Location: [facility];  Service: Orthopedics;  Laterality: Left;  . APPLICATION OF WOUND VAC Right 09/12/2016   Procedure: APPLICATION OF WOUND VAC ABOVE KNEE;  Surgeon: Newt Minion, MD;  Location: [facility];  Service: Orthopedics;  Laterality: Right;  . LOWER EXTREMITY ANGIOGRAPHY Bilateral 08/11/2016   Procedure: Lower Extremity Angiography;  Surgeon: Waynetta Sandy, MD;  Location: [facility];  Service: Cardiovascular;  Laterality: Bilateral;  . PERIPHERAL VASCULAR BALLOON ANGIOPLASTY Left 08/11/2016   Procedure: Peripheral Vascular Balloon Angioplasty;  Surgeon: Waynetta Sandy, MD;  Location: [facility];  Service: Cardiovascular;  Laterality: Left;  SFA  . THYROID SURGERY     Social History   Occupational History  . Occupation: drives a Forensic scientist  Tobacco Use  . Smoking status: Current Every Day Smoker    Packs/day: 0.75    Years: 45.00    Pack years: 33.75    Types: Cigarettes    Last attempt to quit: 11/12/2014    Years since quitting: 2.7  . Smokeless tobacco: Never Used  Substance and Sexual Activity  . Alcohol use: No     Alcohol/week: 0.0 oz    Comment: h/o heavy use; stopped drinking in 7/16  . Drug use: No  . Sexual activity: Not Currently

## 2017-07-31 ENCOUNTER — Telehealth: Payer: Self-pay | Admitting: Family Medicine

## 2017-07-31 ENCOUNTER — Telehealth (INDEPENDENT_AMBULATORY_CARE_PROVIDER_SITE_OTHER): Payer: Self-pay | Admitting: Orthopedic Surgery

## 2017-07-31 NOTE — Telephone Encounter (Signed)
Attempted to contact patient - NA °

## 2017-07-31 NOTE — Telephone Encounter (Signed)
Patients sister called asking for the details from the patients last visit. CB # [phone]

## 2017-07-31 NOTE — Telephone Encounter (Signed)
Please see message below

## 2017-08-01 ENCOUNTER — Telehealth (INDEPENDENT_AMBULATORY_CARE_PROVIDER_SITE_OTHER): Payer: Self-pay | Admitting: Orthopedic Surgery

## 2017-08-01 NOTE — Telephone Encounter (Signed)
Pt aware ntbs to discuss pain medications.

## 2017-08-01 NOTE — Telephone Encounter (Signed)
erin this is for you

## 2017-08-01 NOTE — Telephone Encounter (Signed)
Patient's sister [name] called asked if Junie Panning can call her tomorrow concerning her brothers last visit. The number to contact [name] is [phone] cell  or work # [phone]. She asked to call her cell phone number first.

## 2017-08-02 ENCOUNTER — Telehealth: Payer: Self-pay | Admitting: Family Medicine

## 2017-08-02 NOTE — Telephone Encounter (Signed)
Patient has a follow up appointment scheduled and will discuss pain management at that time.

## 2017-08-14 ENCOUNTER — Ambulatory Visit: Payer: BLUE CROSS/BLUE SHIELD | Admitting: Family Medicine

## 2017-08-14 DIAGNOSIS — R32 Unspecified urinary incontinence: Secondary | ICD-10-CM | POA: Insufficient documentation

## 2017-08-15 ENCOUNTER — Ambulatory Visit (INDEPENDENT_AMBULATORY_CARE_PROVIDER_SITE_OTHER): Payer: BLUE CROSS/BLUE SHIELD | Admitting: Family Medicine

## 2017-08-15 ENCOUNTER — Encounter: Payer: Self-pay | Admitting: Family Medicine

## 2017-08-15 VITALS — BP 118/67 | HR 74 | Temp 96.7°F | Ht 68.0 in

## 2017-08-15 DIAGNOSIS — E039 Hypothyroidism, unspecified: Secondary | ICD-10-CM | POA: Diagnosis not present

## 2017-08-15 DIAGNOSIS — E782 Mixed hyperlipidemia: Secondary | ICD-10-CM | POA: Diagnosis not present

## 2017-08-15 DIAGNOSIS — IMO0002 Reserved for concepts with insufficient information to code with codable children: Secondary | ICD-10-CM

## 2017-08-15 DIAGNOSIS — I10 Essential (primary) hypertension: Secondary | ICD-10-CM | POA: Diagnosis not present

## 2017-08-15 DIAGNOSIS — E1165 Type 2 diabetes mellitus with hyperglycemia: Secondary | ICD-10-CM | POA: Diagnosis not present

## 2017-08-15 DIAGNOSIS — Z794 Long term (current) use of insulin: Secondary | ICD-10-CM

## 2017-08-15 DIAGNOSIS — E1121 Type 2 diabetes mellitus with diabetic nephropathy: Secondary | ICD-10-CM

## 2017-08-15 LAB — BAYER DCA HB A1C WAIVED: HB A1C: 5.8 % (ref <7.0)

## 2017-08-15 MED ORDER — MELOXICAM 15 MG PO TABS: 15.0000 mg | ORAL_TABLET | Freq: Every day | ORAL | 5 refills | Status: DC

## 2017-08-15 MED ORDER — TRAMADOL HCL 50 MG PO TABS: 50.0000 mg | ORAL_TABLET | Freq: Four times a day (QID) | ORAL | 1 refills | Status: DC | PRN

## 2017-08-15 MED ORDER — INSULIN GLARGINE 100 UNIT/ML SOLOSTAR PEN: 10.0000 [IU] | PEN_INJECTOR | Freq: Every day | SUBCUTANEOUS | 11 refills | Status: DC

## 2017-08-15 MED ORDER — ALLOPURINOL 100 MG PO TABS: 100.0000 mg | ORAL_TABLET | Freq: Every day | ORAL | 6 refills | Status: DC

## 2017-08-15 MED ORDER — MEGESTROL ACETATE 400 MG/10ML PO SUSP: 400.0000 mg | Freq: Two times a day (BID) | ORAL | 2 refills | Status: DC

## 2017-08-15 MED ORDER — CARVEDILOL 3.125 MG PO TABS: 3.1250 mg | ORAL_TABLET | Freq: Two times a day (BID) | ORAL | 2 refills | Status: DC

## 2017-08-15 MED ORDER — LEVOTHYROXINE SODIUM 75 MCG PO TABS: 75.0000 ug | ORAL_TABLET | Freq: Every day | ORAL | 1 refills | Status: DC

## 2017-08-15 MED ORDER — ATORVASTATIN CALCIUM 40 MG PO TABS: 40.0000 mg | ORAL_TABLET | Freq: Every day | ORAL | Status: DC

## 2017-08-15 NOTE — Progress Notes (Signed)
Subjective:  Patient ID: Todd [name],  male    DOB: 19-Jun-1955  Age: 62 y.o.    CC: Hypertension (pt here today for routine follow up of his chronic medical conditions)   HPI Todd [name] presents for  follow-up of hypertension. Patient has no history of headache chest pain or shortness of breath or recent cough. Patient also denies symptoms of TIA such as numbness weakness lateralizing.Patient denies side effects from medication. States taking it regularly.  Patient also  in for follow-up of elevated cholesterol. Doing well without complaints on current medication. Denies side effects of statin including myalgia and arthralgia and nausea. Also in today for liver function testing. Currently no chest pain, shortness of breath or other cardiovascular related symptoms noted.  Follow-up of diabetes. Patient does check blood sugar at home. Readings run between 96 and 107 Patient denies symptoms such as polyuria, polydipsia, excessive hunger, nausea No significant hypoglycemic spells noted. Medications reviewed. Pt reports taking them regularly. Pt. denies complication/adverse reaction today.   Patient presents for follow-up on  thyroid. The patient has a history of hypothyroidism for many years. It has been stable recently. Pt. denies any change in  voice, loss of hair, heat or cold intolerance. Energy level has been adequate to good. Patient denies constipation and diarrhea. No myxedema. Medication is as noted below. Verified that pt is taking it daily on an empty stomach. Well tolerated. History Todd [name] has a past medical history of AKI (acute kidney injury) ([place]), Constipated, Diabetes mellitus without complication ([place]), Diarrhea, Elevated LFTs, Goiter, Gout, Hyperlipidemia, Hypertension, Leukocytosis, Reactive thrombocytosis, Right BKA infection ([place]) (08/2016), Right leg pain, Sepsis due to undetermined organism [facility]), Thyroid disease, and Wound infection after surgery (08/2016).    He has a past surgical history that includes Thyroid surgery; Lower Extremity Angiography (Bilateral, 08/11/2016); ABDOMINAL AORTOGRAM (N/A, 08/11/2016); PERIPHERAL VASCULAR BALLOON ANGIOPLASTY (Left, 08/11/2016); ABDOMINAL AORTOGRAM W/LOWER EXTREMITY (N/A, 08/15/2016); Amputation (Right, 08/17/2016); Amputation (Right, 09/12/2016); Application if wound vac (Right, 09/12/2016); and Amputation (Left, 08/12/2016).   His family history includes Diabetes in his maternal aunt and maternal uncle; Heart disease in his mother; Pneumonia in his father.He reports that he has been smoking cigarettes.  He has a 33.75 pack-year smoking history. he has never used smokeless tobacco. He reports that he does not drink alcohol or use drugs.  Current Outpatient Medications on File Prior to Visit  Medication Sig Dispense Refill  . ASPIRIN 81 PO Take 1 tablet by mouth daily.    . bacitracin 500 UNIT/GM ointment Apply 1 application topically daily. 15 g 3  . COLCRYS 0.6 MG tablet Take 1 tablet (0.6 mg total) by mouth 2 (two) times daily as needed. 60 tablet 1  . loperamide (IMODIUM) 2 MG capsule Take 4 mg by mouth as needed for diarrhea or loose stools.    . silver sulfADIAZINE (SILVADENE) 1 % cream Apply 1 application topically daily. 50 g 0   No current facility-administered medications on file prior to visit.     ROS Review of Systems  Constitutional: Negative for chills, diaphoresis, fever and unexpected weight change.  HENT: Negative for congestion, hearing loss, rhinorrhea and sore throat.   Eyes: Negative for visual disturbance.  Respiratory: Negative for cough and shortness of breath.   Cardiovascular: Negative for chest pain.  Gastrointestinal: Negative for abdominal pain, constipation and diarrhea.  Genitourinary: Negative for dysuria and flank pain.  Musculoskeletal: Negative for arthralgias and joint swelling.       Patient is a double amputee  both lower extremities.  The left is a below the knee amputation and  there is a skin ulcer that is under the care of Dr. Sharol Given.  Once it is healed adequately he is planning to resume physical therapy with the goal of getting a prosthetic leg  Skin: Negative for rash.  Neurological: Negative for dizziness and headaches.  Psychiatric/Behavioral: Negative for dysphoric mood and sleep disturbance.    Objective:  BP 118/67   Pulse 74   Temp (!) 96.7 F (35.9 C) (Oral)   Ht _0  (1.727 m)   BMI 24.33 kg/m   BP Readings from Last 3 Encounters:  08/15/17 118/67  05/19/17 (!) 156/104  05/16/17 128/70    Wt Readings from Last 3 Encounters:  07/28/17 160 lb (72.6 kg)  07/07/17 160 lb (72.6 kg)  05/26/17 160 lb (72.6 kg)     Physical Exam  Constitutional: He is oriented to person, place, and time. He appears well-developed and well-nourished.  HENT:  Head: Normocephalic and atraumatic.  Mouth/Throat: Oropharynx is clear and moist.  Eyes: EOM are normal. Pupils are equal, round, and reactive to light.  Neck: Normal range of motion. No tracheal deviation present. No thyromegaly present.  Cardiovascular: Normal rate, regular rhythm and normal heart sounds. Exam reveals no gallop and no friction rub.  No murmur heard. Pulmonary/Chest: Breath sounds normal. He has no wheezes. He has no rales.  Abdominal: Soft. He exhibits no mass. There is no tenderness.  Musculoskeletal: Normal range of motion. He exhibits no edema.  Left below the knee amputation noted.  Simple bandage in place over the ulcer currently under treatment. Right above-the-knee amputation noted.  Neurological: He is alert and oriented to person, place, and time.  Skin: Skin is warm and dry.  Psychiatric: He has a normal mood and affect.        Assessment & Plan:   Todd [name] was seen today for hypertension.  Diagnoses and all orders for this visit:  Essential hypertension -     CBC with Differential/Platelet -     CMP14+EGFR  Hypothyroidism, unspecified type -     CBC with  Differential/Platelet -     CMP14+EGFR -     TSH + free T4  Uncontrolled type 2 diabetes mellitus with diabetic nephropathy, with long-term current use of insulin (HCC) -     CBC with Differential/Platelet -     CMP14+EGFR -     Bayer DCA Hb A1c Waived  Mixed hyperlipidemia -     CBC with Differential/Platelet -     CMP14+EGFR -     Lipid panel  Other orders -     allopurinol (ZYLOPRIM) 100 MG tablet; Take 1 tablet (100 mg total) by mouth daily. -     atorvastatin (LIPITOR) 40 MG tablet; Take 1 tablet (40 mg total) by mouth daily at 6 PM. -     carvedilol (COREG) 3.125 MG tablet; Take 1 tablet (3.125 mg total) by mouth 2 (two) times daily with a meal. -     Insulin Glargine (LANTUS SOLOSTAR) 100 UNIT/ML Solostar Pen; Inject 10 Units into the skin daily. -     levothyroxine (SYNTHROID, LEVOTHROID) 75 MCG tablet; Take 1 tablet (75 mcg total) by mouth daily before breakfast. -     megestrol (MEGACE) 400 MG/10ML suspension; Take 10 mLs (400 mg total) by mouth 2 (two) times daily. For appetite stimulation -     meloxicam (MOBIC) 15 MG tablet; Take 1 tablet (15 mg total) by  mouth daily. -     Discontinue: traMADol (ULTRAM) 50 MG tablet; Take 1 tablet (50 mg total) by mouth every 6 (six) hours as needed for moderate pain. -     traMADol (ULTRAM) 50 MG tablet; Take 1 tablet (50 mg total) by mouth every 6 (six) hours as needed for moderate pain.   I have discontinued Azlaan Glade's lisinopril, cephALEXin, and lisinopril. I am also having him maintain his loperamide, COLCRYS, ASPIRIN 81 PO, silver sulfADIAZINE, bacitracin, allopurinol, atorvastatin, carvedilol, Insulin Glargine, levothyroxine, megestrol, meloxicam, and traMADol.  Meds ordered this encounter  Medications  . allopurinol (ZYLOPRIM) 100 MG tablet    Sig: Take 1 tablet (100 mg total) by mouth daily.    Dispense:  30 tablet    Refill:  6  . atorvastatin (LIPITOR) 40 MG tablet    Sig: Take 1 tablet (40 mg total) by mouth daily  at 6 PM.  . carvedilol (COREG) 3.125 MG tablet    Sig: Take 1 tablet (3.125 mg total) by mouth 2 (two) times daily with a meal.    Dispense:  60 tablet    Refill:  2  . Insulin Glargine (LANTUS SOLOSTAR) 100 UNIT/ML Solostar Pen    Sig: Inject 10 Units into the skin daily.    Dispense:  15 mL    Refill:  11  . levothyroxine (SYNTHROID, LEVOTHROID) 75 MCG tablet    Sig: Take 1 tablet (75 mcg total) by mouth daily before breakfast.    Dispense:  90 tablet    Refill:  1    Please consider 90 day supplies to promote better adherence  . megestrol (MEGACE) 400 MG/10ML suspension    Sig: Take 10 mLs (400 mg total) by mouth 2 (two) times daily. For appetite stimulation    Dispense:  600 mL    Refill:  2  . meloxicam (MOBIC) 15 MG tablet    Sig: Take 1 tablet (15 mg total) by mouth daily.    Dispense:  30 tablet    Refill:  5  . DISCONTD: traMADol (ULTRAM) 50 MG tablet    Sig: Take 1 tablet (50 mg total) by mouth every 6 (six) hours as needed for moderate pain.    Dispense:  60 tablet    Refill:  1  . traMADol (ULTRAM) 50 MG tablet    Sig: Take 1 tablet (50 mg total) by mouth every 6 (six) hours as needed for moderate pain.    Dispense:  60 tablet    Refill:  1     Follow-up: Return in about 3 months (around 11/15/2017).  [name], M.D.

## 2017-08-16 LAB — CMP14+EGFR
A/G RATIO: 1 — AB (ref 1.2–2.2)
ALK PHOS: 124 IU/L — AB (ref 39–117)
ALT: 17 IU/L (ref 0–44)
AST: 33 IU/L (ref 0–40)
Albumin: 4.4 g/dL (ref 3.6–4.8)
BILIRUBIN TOTAL: 0.5 mg/dL (ref 0.0–1.2)
BUN/Creatinine Ratio: 11 (ref 10–24)
BUN: 18 mg/dL (ref 8–27)
CHLORIDE: 100 mmol/L (ref 96–106)
CO2: 17 mmol/L — ABNORMAL LOW (ref 20–29)
Calcium: 10 mg/dL (ref 8.6–10.2)
Creatinine, Ser: 1.62 mg/dL — ABNORMAL HIGH (ref 0.76–1.27)
GFR calc Af Amer: 52 mL/min/{1.73_m2} — ABNORMAL LOW (ref >59)
GFR calc non Af Amer: 45 mL/min/{1.73_m2} — ABNORMAL LOW (ref >59)
GLUCOSE: 74 mg/dL (ref 65–99)
Globulin, Total: 4.2 g/dL (ref 1.5–4.5)
POTASSIUM: 5.2 mmol/L (ref 3.5–5.2)
Sodium: 133 mmol/L — ABNORMAL LOW (ref 134–144)
Total Protein: 8.6 g/dL — ABNORMAL HIGH (ref 6.0–8.5)

## 2017-08-16 LAB — CBC WITH DIFFERENTIAL/PLATELET
BASOS ABS: 0.1 10*3/uL (ref 0.0–0.2)
BASOS: 1 %
EOS (ABSOLUTE): 0.8 10*3/uL — AB (ref 0.0–0.4)
Eos: 8 %
Hematocrit: 42.6 % (ref 37.5–51.0)
Hemoglobin: 14.2 g/dL (ref 13.0–17.7)
Immature Grans (Abs): 0 10*3/uL (ref 0.0–0.1)
Immature Granulocytes: 0 %
LYMPHS ABS: 3.1 10*3/uL (ref 0.7–3.1)
LYMPHS: 31 %
MCH: 28.3 pg (ref 26.6–33.0)
MCHC: 33.3 g/dL (ref 31.5–35.7)
MCV: 85 fL (ref 79–97)
MONOS ABS: 0.4 10*3/uL (ref 0.1–0.9)
Monocytes: 4 %
NEUTROS ABS: 5.6 10*3/uL (ref 1.4–7.0)
Neutrophils: 56 %
PLATELETS: 505 10*3/uL — AB (ref 150–379)
RBC: 5.01 x10E6/uL (ref 4.14–5.80)
RDW: 16.6 % — AB (ref 12.3–15.4)
WBC: 10 10*3/uL (ref 3.4–10.8)

## 2017-08-16 LAB — LIPID PANEL
CHOLESTEROL TOTAL: 131 mg/dL (ref 100–199)
Chol/HDL Ratio: 4.4 ratio (ref 0.0–5.0)
HDL: 30 mg/dL — AB (ref >39)
LDL Calculated: 58 mg/dL (ref 0–99)
Triglycerides: 214 mg/dL — ABNORMAL HIGH (ref 0–149)
VLDL CHOLESTEROL CAL: 43 mg/dL — AB (ref 5–40)

## 2017-08-16 LAB — TSH+FREE T4
FREE T4: 1.43 ng/dL (ref 0.82–1.77)
TSH: 4.9 u[IU]/mL — ABNORMAL HIGH (ref 0.450–4.500)

## 2017-08-28 ENCOUNTER — Telehealth (INDEPENDENT_AMBULATORY_CARE_PROVIDER_SITE_OTHER): Payer: Self-pay

## 2017-08-28 ENCOUNTER — Encounter (INDEPENDENT_AMBULATORY_CARE_PROVIDER_SITE_OTHER): Payer: Self-pay | Admitting: Orthopedic Surgery

## 2017-08-28 ENCOUNTER — Telehealth: Payer: Self-pay | Admitting: Family Medicine

## 2017-08-28 ENCOUNTER — Ambulatory Visit (INDEPENDENT_AMBULATORY_CARE_PROVIDER_SITE_OTHER): Payer: BLUE CROSS/BLUE SHIELD | Admitting: Orthopedic Surgery

## 2017-08-28 VITALS — Ht 68.0 in | Wt 160.0 lb

## 2017-08-28 DIAGNOSIS — Z89512 Acquired absence of left leg below knee: Secondary | ICD-10-CM

## 2017-08-28 DIAGNOSIS — L97901 Non-pressure chronic ulcer of unspecified part of unspecified lower leg limited to breakdown of skin: Secondary | ICD-10-CM | POA: Diagnosis not present

## 2017-08-28 DIAGNOSIS — E11622 Type 2 diabetes mellitus with other skin ulcer: Secondary | ICD-10-CM

## 2017-08-28 NOTE — Telephone Encounter (Signed)
Patient would like to know if you could email her all of this information so she can have a copy of it and she can share it with her brother.  [email or web address]

## 2017-08-28 NOTE — Telephone Encounter (Signed)
Pt's siter called during appt and wanted to know the progress and steps to take for here on out. Advised per Dr. Sharol Given the pt must stop smoking, stop crawling on his hands and knees and putting direct pressure on the wound. He must wear the compression sock to help shape the limb and reduce swelling. If these behavior modifications can not be met then the pt will require higher level amputation and this is what we are trying to avoid. To call with any questions.

## 2017-08-28 NOTE — Progress Notes (Signed)
Office Visit Note   Patient: Todd [name]           Date of Birth: 06/14/1955           MRN: [id number] Visit Date: 08/28/2017              Requested by: [name], MD [place], [place] [address] PCP: [name], MD  Chief Complaint  Patient presents with  . Left Leg - Follow-up    BKA ulcer at tibial tubercle       HPI: Patient is a 62 year old gentleman status post right above-the-knee amputation who presents in follow-up for a ulcer over his tibial tubercle for left transtibial amputation.  Patient is currently using Bactroban dressing changes.  Patient states he does crawl around on his leg at home.  Patient states he still has drainage and still smokes.  Assessment & Plan: Visit Diagnoses:  1. Acquired absence of left leg below knee (HCC)   2. Diabetic ulcer of lower leg, limited to breakdown of skin ([place])     Plan: Recommended that patient not crawling the leg.  Discussed that we will hold on his therapy with [facility] with outpatient gait training.  Patient is currently wearing a cotton sock over his leg recommended that he wear the black medical compression stocking recommended Bactroban smoking cessation follow-up in 3 weeks.  Follow-Up Instructions: Return in about 3 weeks (around 09/18/2017).   Ortho Exam  Patient is alert, oriented, no adenopathy, well-dressed, normal affect, normal respiratory effort. Examination patient has a granulated wound over the tibial tubercle that is 2 x 5 cm.  There is approximately 75% granulation tissue the remainder is fibrinous tissue there is no exposed bone or tendon the ulcer is 1 mm deep.  There is no cellulitis patient has no systemic symptoms no fever or chills.  Imaging: No results found. No images are attached to the encounter.  Labs: Lab Results  Component Value Date   HGBA1C 6.8 (H) 08/08/2016   HGBA1C 14.3 (H) 12/21/2015   ESRSEDRATE 138 (H) 09/08/2016   ESRSEDRATE 117 (H) 08/08/2016   CRP 7.1 (H)  09/08/2016   CRP 3.2 (H) 08/08/2016   LABURIC 9.5 (H) 11/11/2016   LABURIC 9.2 (H) 07/11/2016   LABURIC 11.4 (H) 07/04/2016   REPTSTATUS 09/13/2016 FINAL 09/08/2016   REPTSTATUS 09/13/2016 FINAL 09/08/2016   CULT NO GROWTH 5 DAYS 09/08/2016   CULT NO GROWTH 5 DAYS 09/08/2016    @LABSALLVALUES (HGBA1)@  Body mass index is 24.33 kg/m.  Orders:  No orders of the defined types were placed in this encounter.  No orders of the defined types were placed in this encounter.    Procedures: No procedures performed  Clinical Data: No additional findings.  ROS:  All other systems negative, except as noted in the HPI. Review of Systems  Objective: Vital Signs: Ht 5\' 8"  (1.727 m)   Wt 160 lb (72.6 kg)   BMI 24.33 kg/m   Specialty Comments:  No specialty comments available.  PMFS History: Patient Active Problem List   Diagnosis Date Noted  . Urinary incontinence 08/14/2017  . Diabetic ulcer of lower leg, limited to breakdown of skin ([place]) 07/28/2017  . History of right above knee amputation ([place]) 09/21/2016  . Acquired absence of left leg below knee ([place]) 09/21/2016  . Transaminitis   . Diabetic peripheral neuropathy ([place]) 07/21/2016  . Renal failure 07/04/2016  . Hyperuricemia 06/21/2016  . HTN (hypertension) 01/05/2016  . Diabetes type 2, uncontrolled ([place])  01/05/2016  . Hypothyroidism 05/10/2011   Past Medical History:  Diagnosis Date  . AKI (acute kidney injury) ([place])   . Constipated   . Diabetes mellitus without complication ([place])   . Diarrhea   . Elevated LFTs   . Goiter   . Gout   . Hyperlipidemia   . Hypertension   . Leukocytosis   . Reactive thrombocytosis   . Right BKA infection ([place]) 08/2016  . Right leg pain   . Sepsis due to undetermined organism ([place])   . Thyroid disease   . Wound infection after surgery 08/2016    Family History  Problem Relation Age of Onset  . Heart disease Mother   . Pneumonia Father   . Diabetes Maternal Aunt   .  Diabetes Maternal Uncle     Past Surgical History:  Procedure Laterality Date  . ABDOMINAL AORTOGRAM N/A 08/11/2016   Procedure: Abdominal Aortogram;  Surgeon: Waynetta Sandy, MD;  Location: [facility];  Service: Cardiovascular;  Laterality: N/A;  . ABDOMINAL AORTOGRAM W/LOWER EXTREMITY N/A 08/15/2016   Procedure: Abdominal Aortogram w/Lower Extremity;  Surgeon: Elam Dutch, MD;  Location: [facility];  Service: Cardiovascular;  Laterality: N/A;  . AMPUTATION Right 08/17/2016   Procedure: RIGHT BELOW KNEE AMPUTATION;  Surgeon: Elam Dutch, MD;  Location: [facility];  Service: Vascular;  Laterality: Right;  . AMPUTATION Right 09/12/2016   Procedure: AMPUTATION ABOVE KNEE;  Surgeon: Newt Minion, MD;  Location: [facility];  Service: Orthopedics;  Laterality: Right;  . AMPUTATION Left 08/12/2016   Procedure: LEFT BELOW KNEE AMPUTATION;  Surgeon: Newt Minion, MD;  Location: [facility];  Service: Orthopedics;  Laterality: Left;  . APPLICATION OF WOUND VAC Right 09/12/2016   Procedure: APPLICATION OF WOUND VAC ABOVE KNEE;  Surgeon: Newt Minion, MD;  Location: [facility];  Service: Orthopedics;  Laterality: Right;  . LOWER EXTREMITY ANGIOGRAPHY Bilateral 08/11/2016   Procedure: Lower Extremity Angiography;  Surgeon: Waynetta Sandy, MD;  Location: [facility];  Service: Cardiovascular;  Laterality: Bilateral;  . PERIPHERAL VASCULAR BALLOON ANGIOPLASTY Left 08/11/2016   Procedure: Peripheral Vascular Balloon Angioplasty;  Surgeon: Waynetta Sandy, MD;  Location: [facility];  Service: Cardiovascular;  Laterality: Left;  SFA  . THYROID SURGERY     Social History   Occupational History  . Occupation: drives a Forensic scientist  Tobacco Use  . Smoking status: Current Every Day Smoker    Packs/day: 0.75    Years: 45.00    Pack years: 33.75    Types: Cigarettes    Last attempt to quit: 11/12/2014    Years since quitting: 2.7  . Smokeless tobacco: Never Used  Substance  and Sexual Activity  . Alcohol use: No    Alcohol/week: 0.0 oz    Comment: h/o heavy use; stopped drinking in 7/16  . Drug use: No  . Sexual activity: Not Currently

## 2017-08-28 NOTE — Telephone Encounter (Signed)
FYI, pt is signing up to be recertified with medicaid (starting May 1st) if any test please schedule before then

## 2017-08-29 ENCOUNTER — Encounter: Payer: Self-pay | Admitting: Physical Therapy

## 2017-08-29 NOTE — Telephone Encounter (Signed)
I called and sw pt's sister and per Dr. Sharol Given advised what was left on message yesterday. She is wanting a copy of the office note from yesterday or some documentation to review with her brother and encourage good habits and behavior. She advised that the pt may be depressed and I strongly encourage her to have pt make appt with PCP to discuss and see what options he has available to help through this.

## 2017-08-29 NOTE — Therapy (Signed)
[facility] [address] [place], [place], [address] Phone: [phone]   Fax:  [phone]  Patient Details  Name: Todd [name] MRN: [id number] Date of Birth: 09/30/1955 Referring Provider:  [name], MD  Encounter Date: 08/29/2017  PHYSICAL THERAPY DISCHARGE SUMMARY  Visits from Start of Care: 29  Current functional level related to goals / functional outcomes: Patient was placed on hold as developed wounds on transtibial residual limb & could not wear prosthesis. Last seen in PT on 05/17/2017.  Patient has called PT to inform that Dr. Sharol Given is still holding TTA prosthesis wear due to wounds. Due to amount of time, PT is discharging and will re-evaluate once he is able to wear prostheses again.    Remaining deficits: Unknown as not seen in 3 months.    Education / Equipment: Prosthetic care & HEP  Plan: Patient agrees to discharge.  Patient goals were not met. Patient is being discharged due to a change in medical status.  ?????         Devon Pretty PT, DPT 08/29/2017, 8:33 AM  [facility] [address] [place], [place], [address] Phone: [phone]   Fax:  [phone]

## 2017-09-06 ENCOUNTER — Telehealth: Payer: Self-pay | Admitting: Family Medicine

## 2017-09-06 ENCOUNTER — Other Ambulatory Visit: Payer: Self-pay | Admitting: Family Medicine

## 2017-09-06 MED ORDER — TRAMADOL HCL 50 MG PO TABS: 50.0000 mg | ORAL_TABLET | Freq: Four times a day (QID) | ORAL | 1 refills | Status: DC | PRN

## 2017-09-06 NOTE — Telephone Encounter (Signed)
I sent in the requested prescription 

## 2017-09-07 NOTE — Telephone Encounter (Signed)
Pt aware.

## 2017-09-18 ENCOUNTER — Ambulatory Visit (INDEPENDENT_AMBULATORY_CARE_PROVIDER_SITE_OTHER): Payer: BLUE CROSS/BLUE SHIELD | Admitting: Orthopedic Surgery

## 2017-09-18 ENCOUNTER — Encounter (INDEPENDENT_AMBULATORY_CARE_PROVIDER_SITE_OTHER): Payer: Self-pay | Admitting: Orthopedic Surgery

## 2017-09-18 DIAGNOSIS — Z89512 Acquired absence of left leg below knee: Secondary | ICD-10-CM | POA: Diagnosis not present

## 2017-09-18 DIAGNOSIS — L97901 Non-pressure chronic ulcer of unspecified part of unspecified lower leg limited to breakdown of skin: Secondary | ICD-10-CM

## 2017-09-18 DIAGNOSIS — E11622 Type 2 diabetes mellitus with other skin ulcer: Secondary | ICD-10-CM

## 2017-09-18 NOTE — Progress Notes (Signed)
Office Visit Note   Patient: Todd [name]           Date of Birth: 09-09-1955           MRN: [id number] Visit Date: 09/18/2017              Requested by: [name], MD [place], [place] [address] PCP: [name], MD  Chief Complaint  Patient presents with  . Left Leg - Follow-up    BKA with ulcer tibial tubercle      HPI: Patient is a 62 year old gentleman with a left transtibial amputation with ulcer over the tibial tubercle.  Patient states that the compression stocking is too tight he has been using antibiotic ointment and a dressing and states that he is not crawling on his knee.  Assessment & Plan: Visit Diagnoses:  1. Acquired absence of left leg below knee (HCC)   2. Diabetic ulcer of lower leg, limited to breakdown of skin ([place])     Plan: We will have him continue the antibiotic ointment dressing changes daily recommended trying the compression if he could.  At follow-up in 4 weeks if he is not showing any improvement would need to consider allograft split-thickness skin graft.  Follow-Up Instructions: Return in about 1 month (around 10/16/2017).   Ortho Exam  Patient is alert, oriented, no adenopathy, well-dressed, normal affect, normal respiratory effort. Examination patient is ambulating in a wheelchair.  The wound bed has good granulation tissue there is no exposed bone or tendon.  The ulcer measures 30 x 40 mm and is 1 mm deep there is no drainage no cellulitis no signs of infection.  Imaging: No results found. No images are attached to the encounter.  Labs: Lab Results  Component Value Date   HGBA1C 6.8 (H) 08/08/2016   HGBA1C 14.3 (H) 12/21/2015   ESRSEDRATE 138 (H) 09/08/2016   ESRSEDRATE 117 (H) 08/08/2016   CRP 7.1 (H) 09/08/2016   CRP 3.2 (H) 08/08/2016   LABURIC 9.5 (H) 11/11/2016   LABURIC 9.2 (H) 07/11/2016   LABURIC 11.4 (H) 07/04/2016   REPTSTATUS 09/13/2016 FINAL 09/08/2016   REPTSTATUS 09/13/2016 FINAL 09/08/2016   CULT NO GROWTH 5 DAYS 09/08/2016   CULT NO GROWTH 5 DAYS 09/08/2016    @LABSALLVALUES (HGBA1)@  There is no height or weight on file to calculate BMI.  Orders:  No orders of the defined types were placed in this encounter.  No orders of the defined types were placed in this encounter.    Procedures: No procedures performed  Clinical Data: No additional findings.  ROS:  All other systems negative, except as noted in the HPI. Review of Systems  Objective: Vital Signs: There were no vitals taken for this visit.  Specialty Comments:  No specialty comments available.  PMFS History: Patient Active Problem List   Diagnosis Date Noted  . Urinary incontinence 08/14/2017  . Diabetic ulcer of lower leg, limited to breakdown of skin ([place]) 07/28/2017  . History of right above knee amputation ([place]) 09/21/2016  . Acquired absence of left leg below knee ([place]) 09/21/2016  . Transaminitis   . Diabetic peripheral neuropathy ([place]) 07/21/2016  . Renal failure 07/04/2016  . Hyperuricemia 06/21/2016  . HTN (hypertension) 01/05/2016  . Diabetes type 2, uncontrolled ([facility]) 01/05/2016  . Hypothyroidism 05/10/2011   Past Medical History:  Diagnosis Date  . AKI (acute kidney injury) ([place])   . Constipated   . Diabetes mellitus without complication ([place])   . Diarrhea   .  Elevated LFTs   . Goiter   . Gout   . Hyperlipidemia   . Hypertension   . Leukocytosis   . Reactive thrombocytosis   . Right BKA infection ([place]) 08/2016  . Right leg pain   . Sepsis due to undetermined organism ([place])   . Thyroid disease   . Wound infection after surgery 08/2016    Family History  Problem Relation Age of Onset  . Heart disease Mother   . Pneumonia Father   . Diabetes Maternal Aunt   . Diabetes Maternal Uncle     Past Surgical History:  Procedure Laterality Date  . ABDOMINAL AORTOGRAM N/A 08/11/2016   Procedure: Abdominal Aortogram;  Surgeon: Waynetta Sandy, MD;  Location: [facility];  Service: Cardiovascular;  Laterality: N/A;  . ABDOMINAL AORTOGRAM W/LOWER EXTREMITY N/A 08/15/2016   Procedure: Abdominal Aortogram w/Lower Extremity;  Surgeon: Elam Dutch, MD;  Location: [facility];  Service: Cardiovascular;  Laterality: N/A;  . AMPUTATION Right 08/17/2016   Procedure: RIGHT BELOW KNEE AMPUTATION;  Surgeon: Elam Dutch, MD;  Location: [facility];  Service: Vascular;  Laterality: Right;  . AMPUTATION Right 09/12/2016   Procedure: AMPUTATION ABOVE KNEE;  Surgeon: Newt Minion, MD;  Location: [facility];  Service: Orthopedics;  Laterality: Right;  . AMPUTATION Left 08/12/2016   Procedure: LEFT BELOW KNEE AMPUTATION;  Surgeon: Newt Minion, MD;  Location: [facility];  Service: Orthopedics;  Laterality: Left;  . APPLICATION OF WOUND VAC Right 09/12/2016   Procedure: APPLICATION OF WOUND VAC ABOVE KNEE;  Surgeon: Newt Minion, MD;  Location: [facility];  Service: Orthopedics;  Laterality: Right;  . LOWER EXTREMITY ANGIOGRAPHY Bilateral 08/11/2016   Procedure: Lower Extremity Angiography;  Surgeon: Waynetta Sandy, MD;  Location: [facility];  Service: Cardiovascular;  Laterality: Bilateral;  . PERIPHERAL VASCULAR BALLOON ANGIOPLASTY Left 08/11/2016   Procedure: Peripheral Vascular Balloon Angioplasty;  Surgeon: Waynetta Sandy, MD;  Location: [facility];  Service: Cardiovascular;  Laterality: Left;  SFA  . THYROID SURGERY     Social History   Occupational History  . Occupation: drives a Forensic scientist  Tobacco Use  . Smoking status: Current Every Day Smoker    Packs/day: 0.75    Years: 45.00    Pack years: 33.75    Types: Cigarettes    Last attempt to quit: 11/12/2014    Years since quitting: 2.8  . Smokeless tobacco: Never Used  Substance and Sexual Activity  . Alcohol use: No    Alcohol/week: 0.0 oz    Comment: h/o heavy use; stopped drinking in 7/16  . Drug use: No  . Sexual activity: Not Currently

## 2017-10-10 ENCOUNTER — Other Ambulatory Visit: Payer: Self-pay | Admitting: Family Medicine

## 2017-10-12 ENCOUNTER — Telehealth: Payer: Self-pay | Admitting: Family Medicine

## 2017-10-12 ENCOUNTER — Telehealth (INDEPENDENT_AMBULATORY_CARE_PROVIDER_SITE_OTHER): Payer: Self-pay | Admitting: Orthopedic Surgery

## 2017-10-12 NOTE — Telephone Encounter (Signed)
Patients family member called for patient in regards to possibly getting some samples of the colcrys RX. Dr. Sharol Given wasn't the one to prescribe him the medication but his medicaid is currently inactive and they are reapplying. Without the insurance it's well over 500 dollars and they would like a call back at [phone] or you can contact [facility] at [phone]

## 2017-10-12 NOTE — Telephone Encounter (Signed)
Returned daughters phone call and informed that this office does not have colcrys samples

## 2017-10-13 NOTE — Telephone Encounter (Signed)
I called pt and advised that we dont have any samples in the office but when he comes for his appt on Monday Dr. Sharol Given can discuss maybe alternate medication that is less expensive. Pt pleased with this plan.

## 2017-10-14 ENCOUNTER — Telehealth: Payer: Self-pay | Admitting: Family Medicine

## 2017-10-14 NOTE — Telephone Encounter (Signed)
What symptoms do you have? diarrhea  How long have you been sick? 2 days  Have you been seen for this problem? no  If your provider decides to give you a prescription, which pharmacy would you like for it to be sent to? [facility] [place]  Patient informed that this information will be sent to the clinical staff for review and that they should receive a follow up call.

## 2017-10-14 NOTE — Telephone Encounter (Signed)
Aware. Try immodium, bland diet, force fluids.  Schedule an appointment if no improvement.

## 2017-10-16 ENCOUNTER — Telehealth (INDEPENDENT_AMBULATORY_CARE_PROVIDER_SITE_OTHER): Payer: Self-pay | Admitting: Orthopedic Surgery

## 2017-10-16 ENCOUNTER — Ambulatory Visit (INDEPENDENT_AMBULATORY_CARE_PROVIDER_SITE_OTHER): Payer: Self-pay | Admitting: Orthopedic Surgery

## 2017-10-16 ENCOUNTER — Encounter (INDEPENDENT_AMBULATORY_CARE_PROVIDER_SITE_OTHER): Payer: Self-pay | Admitting: Orthopedic Surgery

## 2017-10-16 VITALS — Ht 68.0 in | Wt 160.0 lb

## 2017-10-16 DIAGNOSIS — Z89512 Acquired absence of left leg below knee: Secondary | ICD-10-CM

## 2017-10-16 NOTE — Progress Notes (Signed)
Office Visit Note   Patient: Todd [name]           Date of Birth: 1956/01/07           MRN: [id number] Visit Date: 10/16/2017              Requested by: [name], MD [place], [place] [address] PCP: [name], MD  Chief Complaint  Patient presents with  . Right Leg - Routine Post Op    09/12/16 right AKA  . Left Leg - Routine Post Op    08/12/16 left BKA      HPI: Patient is a 62 year old gentleman with a right above-the-knee amputation and a left transtibial amputation with a ulcer over the tibial tubercle on the left.  Patient states he is doing dressing changes daily.  Assessment & Plan: Visit Diagnoses:  1. Acquired absence of left leg below knee (HCC)     Plan: Continue with wound care discussed the importance of not kneeling on the leg.  Follow-Up Instructions: Return in about 3 weeks (around 11/06/2017).   Ortho Exam  Patient is alert, oriented, no adenopathy, well-dressed, normal affect, normal respiratory effort. Examination the wound has 100% healthy granulation tissue there is no redness no cellulitis no drainage.  The wound is over the tibial tubercle and measures 25 x 40 mm and is 1 mm deep.  There is no exposed bone or tendon.  Imaging: No results found. No images are attached to the encounter.  Labs: Lab Results  Component Value Date   HGBA1C 6.8 (H) 08/08/2016   HGBA1C 14.3 (H) 12/21/2015   ESRSEDRATE 138 (H) 09/08/2016   ESRSEDRATE 117 (H) 08/08/2016   CRP 7.1 (H) 09/08/2016   CRP 3.2 (H) 08/08/2016   LABURIC 9.5 (H) 11/11/2016   LABURIC 9.2 (H) 07/11/2016   LABURIC 11.4 (H) 07/04/2016   REPTSTATUS 09/13/2016 FINAL 09/08/2016   REPTSTATUS 09/13/2016 FINAL 09/08/2016   CULT NO GROWTH 5 DAYS 09/08/2016   CULT NO GROWTH 5 DAYS 09/08/2016    Lab Results  Component Value Date/Time   HGBA1C 6.8 (H) 08/08/2016 09:04 PM   HGBA1C 14.3 (H) 12/21/2015 04:58 AM    Body mass index is 24.33 kg/m.  Orders:  No orders of the  defined types were placed in this encounter.  No orders of the defined types were placed in this encounter.    Procedures: No procedures performed  Clinical Data: No additional findings.  ROS:  All other systems negative, except as noted in the HPI. Review of Systems  Objective: Vital Signs: Ht 5\' 8"  (1.727 m)   Wt 160 lb (72.6 kg)   BMI 24.33 kg/m   Specialty Comments:  No specialty comments available.  PMFS History: Patient Active Problem List   Diagnosis Date Noted  . Urinary incontinence 08/14/2017  . Diabetic ulcer of lower leg, limited to breakdown of skin ([place]) 07/28/2017  . History of right above knee amputation ([place]) 09/21/2016  . Acquired absence of left leg below knee ([place]) 09/21/2016  . Transaminitis   . Diabetic peripheral neuropathy ([place]) 07/21/2016  . Renal failure 07/04/2016  . Hyperuricemia 06/21/2016  . HTN (hypertension) 01/05/2016  . Diabetes type 2, uncontrolled ([place]) 01/05/2016  . Hypothyroidism 05/10/2011   Past Medical History:  Diagnosis Date  . AKI (acute kidney injury) ([place])   . Constipated   . Diabetes mellitus without complication ([place])   . Diarrhea   . Elevated LFTs   . Goiter   .  Gout   . Hyperlipidemia   . Hypertension   . Leukocytosis   . Reactive thrombocytosis   . Right BKA infection ([place]) 08/2016  . Right leg pain   . Sepsis due to undetermined organism ([place])   . Thyroid disease   . Wound infection after surgery 08/2016    Family History  Problem Relation Age of Onset  . Heart disease Mother   . Pneumonia Father   . Diabetes Maternal Aunt   . Diabetes Maternal Uncle     Past Surgical History:  Procedure Laterality Date  . ABDOMINAL AORTOGRAM N/A 08/11/2016   Procedure: Abdominal Aortogram;  Surgeon: Waynetta Sandy, MD;  Location: [facility];  Service: Cardiovascular;  Laterality: N/A;  . ABDOMINAL AORTOGRAM W/LOWER EXTREMITY N/A 08/15/2016   Procedure: Abdominal Aortogram w/Lower Extremity;   Surgeon: Elam Dutch, MD;  Location: [facility];  Service: Cardiovascular;  Laterality: N/A;  . AMPUTATION Right 08/17/2016   Procedure: RIGHT BELOW KNEE AMPUTATION;  Surgeon: Elam Dutch, MD;  Location: [facility];  Service: Vascular;  Laterality: Right;  . AMPUTATION Right 09/12/2016   Procedure: AMPUTATION ABOVE KNEE;  Surgeon: Newt Minion, MD;  Location: [facility];  Service: Orthopedics;  Laterality: Right;  . AMPUTATION Left 08/12/2016   Procedure: LEFT BELOW KNEE AMPUTATION;  Surgeon: Newt Minion, MD;  Location: [facility];  Service: Orthopedics;  Laterality: Left;  . APPLICATION OF WOUND VAC Right 09/12/2016   Procedure: APPLICATION OF WOUND VAC ABOVE KNEE;  Surgeon: Newt Minion, MD;  Location: [facility];  Service: Orthopedics;  Laterality: Right;  . LOWER EXTREMITY ANGIOGRAPHY Bilateral 08/11/2016   Procedure: Lower Extremity Angiography;  Surgeon: Waynetta Sandy, MD;  Location: [facility];  Service: Cardiovascular;  Laterality: Bilateral;  . PERIPHERAL VASCULAR BALLOON ANGIOPLASTY Left 08/11/2016   Procedure: Peripheral Vascular Balloon Angioplasty;  Surgeon: Waynetta Sandy, MD;  Location: [facility];  Service: Cardiovascular;  Laterality: Left;  SFA  . THYROID SURGERY     Social History   Occupational History  . Occupation: drives a Forensic scientist  Tobacco Use  . Smoking status: Current Every Day Smoker    Packs/day: 0.75    Years: 45.00    Pack years: 33.75    Types: Cigarettes    Last attempt to quit: 11/12/2014    Years since quitting: 2.9  . Smokeless tobacco: Never Used  Substance and Sexual Activity  . Alcohol use: No    Alcohol/week: 0.0 oz    Comment: h/o heavy use; stopped drinking in 7/16  . Drug use: No  . Sexual activity: Not Currently

## 2017-10-16 NOTE — Telephone Encounter (Signed)
Patients sister [name] called wanting to know how her brothers appointment went today, and what his status is. CB # [phone] or [phone] can leave detailed message on either number.

## 2017-10-18 ENCOUNTER — Ambulatory Visit: Payer: Self-pay | Admitting: Family Medicine

## 2017-10-20 ENCOUNTER — Telehealth (INDEPENDENT_AMBULATORY_CARE_PROVIDER_SITE_OTHER): Payer: Self-pay | Admitting: Orthopedic Surgery

## 2017-10-20 NOTE — Telephone Encounter (Signed)
Patients sister [name] called, she told me she talked with patient on the phone about his pain and he told her that out of a 1-10 scale that his pain was an 8. She is asking that you call in a pain med for him if possible. (she said he was the type of person he wont let anyone know when he is in pain so he didn't ask for anything at his visit on Monday) She is requesting we give patient a call back if any medication is called in for him # [phone]  Also, [name] never received a call back concerning his appt on Monday about his follow up and status. Previous message was sent about this. Her # [phone]

## 2017-10-20 NOTE — Telephone Encounter (Signed)
Rx request, see note.

## 2017-10-23 ENCOUNTER — Other Ambulatory Visit (INDEPENDENT_AMBULATORY_CARE_PROVIDER_SITE_OTHER): Payer: Self-pay

## 2017-10-23 ENCOUNTER — Telehealth: Payer: Self-pay | Admitting: Family Medicine

## 2017-10-23 MED ORDER — TRAMADOL HCL 50 MG PO TABS: 50.0000 mg | ORAL_TABLET | Freq: Four times a day (QID) | ORAL | 0 refills | Status: DC | PRN

## 2017-10-23 NOTE — Telephone Encounter (Signed)
Patients sister, [name] called again to check and see if there have been any updates as far as getting her brother pain medication. She said he is in a lot of pain. She would like a call back on her work number to see if we'll do anything? # [phone].   She still wants a call regarding his appt on 5/6 as well.

## 2017-10-23 NOTE — Telephone Encounter (Signed)
Call patient.  We could call in a prescription for tramadol other pain medicine which ever works best.

## 2017-10-23 NOTE — Telephone Encounter (Signed)
I called pt and he would like a refill of his tramadol. This was refilled as it was before per Dr. Sharol Given and called into the pharmacy. The pt and his mother were both on the phone the sister was not present but advised that if she had any questions to call the office with questions.

## 2017-10-23 NOTE — Telephone Encounter (Signed)
Duplicate message will sign off on this and address message I have today.

## 2017-10-23 NOTE — Telephone Encounter (Signed)
Pt notified will need to be seen appt scheduled for 10/24/2017

## 2017-10-24 ENCOUNTER — Encounter: Payer: Self-pay | Admitting: Family Medicine

## 2017-10-24 ENCOUNTER — Ambulatory Visit (INDEPENDENT_AMBULATORY_CARE_PROVIDER_SITE_OTHER): Payer: Medicaid Other | Admitting: Family Medicine

## 2017-10-24 VITALS — BP 82/54 | HR 86 | Temp 97.1°F | Ht 68.0 in

## 2017-10-24 DIAGNOSIS — Z89512 Acquired absence of left leg below knee: Secondary | ICD-10-CM

## 2017-10-24 DIAGNOSIS — N179 Acute kidney failure, unspecified: Secondary | ICD-10-CM

## 2017-10-24 DIAGNOSIS — I10 Essential (primary) hypertension: Secondary | ICD-10-CM

## 2017-10-24 DIAGNOSIS — N184 Chronic kidney disease, stage 4 (severe): Secondary | ICD-10-CM

## 2017-10-24 DIAGNOSIS — E1165 Type 2 diabetes mellitus with hyperglycemia: Secondary | ICD-10-CM

## 2017-10-24 DIAGNOSIS — E039 Hypothyroidism, unspecified: Secondary | ICD-10-CM

## 2017-10-24 DIAGNOSIS — E790 Hyperuricemia without signs of inflammatory arthritis and tophaceous disease: Secondary | ICD-10-CM

## 2017-10-24 DIAGNOSIS — G546 Phantom limb syndrome with pain: Secondary | ICD-10-CM

## 2017-10-24 LAB — BAYER DCA HB A1C WAIVED: HB A1C (BAYER DCA - WAIVED): 5.8 % (ref <7.0)

## 2017-10-24 MED ORDER — ATORVASTATIN CALCIUM 40 MG PO TABS: 40.0000 mg | ORAL_TABLET | Freq: Every day | ORAL | Status: DC

## 2017-10-24 MED ORDER — TRAMADOL HCL 50 MG PO TABS: 50.0000 mg | ORAL_TABLET | Freq: Four times a day (QID) | ORAL | 2 refills | Status: DC | PRN

## 2017-10-24 MED ORDER — CARVEDILOL 3.125 MG PO TABS: 3.1250 mg | ORAL_TABLET | Freq: Two times a day (BID) | ORAL | 2 refills | Status: DC

## 2017-10-24 NOTE — Progress Notes (Signed)
Subjective:  Patient ID: Todd [name],  male    DOB: [birth year]  Age: 62 y.o.    CC: Pain (pt here today c/o pain and his gout medicine is $500 without insurance )   HPI Jarman Litton presents for  follow-up of hypertension. Patient has no history of headache chest pain or shortness of breath or recent cough. Patient also denies symptoms of TIA such as numbness weakness lateralizing. Patient denies side effects from medication. States taking it regularly.  Patient also  in for follow-up of elevated cholesterol. Doing well without complaints on current medication. Denies side effects  including myalgia and arthralgia and nausea. Also in today for liver function testing. Currently no chest pain, shortness of breath or other cardiovascular related symptoms noted.  Follow-up of diabetes. Patient does check blood sugar at home. Readings run between 96-1 08 he is checking fasting but he does not check postprandial.  His appetite continues to be poor.  He cannot afford the megestrol now that he has no insurance.  He takes this will take every now and then of the mega-Strahl that he has leftover from previously.  Patient is not completely sure what he is taking he thinks he is taking the allopurinol.  He verifies that he does take the Lantus daily. Patient denies symptoms such as excessive hunger or urinary frequency, excessive hunger, nausea No significant hypoglycemic spells noted. Medications reviewed. Pt reports taking them regularly. Pt. denies complication/adverse reaction today.   Patient is having 10/10 pain at the distal stump of the left BKA.  He relates this to both phantom pain and to the presence of a sore that is not healing.  He request an increase in his tramadol or something else that might be useful.  He is taking 1 a day and not getting good relief from that.   history Colter has a past medical history of AKI (acute kidney injury) ([place]), Constipated, Diabetes mellitus  without complication ([place]), Diarrhea, Elevated LFTs, Goiter, Gout, Hyperlipidemia, Hypertension, Leukocytosis, Reactive thrombocytosis, Right BKA infection ([place]) (08/2016), Right leg pain, Sepsis due to undetermined organism [facility]), Thyroid disease, and Wound infection after surgery (08/2016).   He has a past surgical history that includes Thyroid surgery; Lower Extremity Angiography (Bilateral, 08/11/2016); ABDOMINAL AORTOGRAM (N/A, 08/11/2016); PERIPHERAL VASCULAR BALLOON ANGIOPLASTY (Left, 08/11/2016); ABDOMINAL AORTOGRAM W/LOWER EXTREMITY (N/A, 08/15/2016); Amputation (Right, 08/17/2016); Amputation (Right, 09/12/2016); Application if wound vac (Right, 09/12/2016); and Amputation (Left, 08/12/2016).   His family history includes Diabetes in his maternal aunt and maternal uncle; Heart disease in his mother; Pneumonia in his father.He reports that he has been smoking cigarettes.  He has a 33.75 pack-year smoking history. He has never used smokeless tobacco. He reports that he does not drink alcohol or use drugs.  Current Outpatient Medications on File Prior to Visit  Medication Sig Dispense Refill  . allopurinol (ZYLOPRIM) 100 MG tablet Take 1 tablet (100 mg total) by mouth daily. 30 tablet 6  . ASPIRIN 81 PO Take 1 tablet by mouth daily.    . bacitracin 500 UNIT/GM ointment Apply 1 application topically daily. 15 g 3  . COLCRYS 0.6 MG tablet TAKE 1 TABLET BY MOUTH TWICE DAILY AS NEEDED 60 tablet 2  . Insulin Glargine (LANTUS SOLOSTAR) 100 UNIT/ML Solostar Pen Inject 10 Units into the skin daily. 15 mL 11  . levothyroxine (SYNTHROID, LEVOTHROID) 75 MCG tablet Take 1 tablet (75 mcg total) by mouth daily before breakfast. 90 tablet 1  . lisinopril (PRINIVIL,ZESTRIL) 40 MG tablet  1  . loperamide (IMODIUM) 2 MG capsule Take 4 mg by mouth as needed for diarrhea or loose stools.    . megestrol (MEGACE) 400 MG/10ML suspension Take 10 mLs (400 mg total) by mouth 2 (two) times daily. For appetite stimulation 600 mL 2  .  meloxicam (MOBIC) 15 MG tablet Take 1 tablet (15 mg total) by mouth daily. 30 tablet 5  . silver sulfADIAZINE (SILVADENE) 1 % cream Apply 1 application topically daily. 50 g 0   No current facility-administered medications on file prior to visit.     ROS Review of Systems  Constitutional: Negative.   HENT: Negative.   Eyes: Negative for visual disturbance.  Respiratory: Negative for cough and shortness of breath.   Cardiovascular: Negative for chest pain and leg swelling.  Gastrointestinal: Negative for abdominal pain, diarrhea, nausea and vomiting.  Genitourinary: Negative for difficulty urinating.  Musculoskeletal: Positive for gait problem (Wheelchair-bound due to amputation both lower extremities). Negative for arthralgias and myalgias.  Skin: Negative for rash.  Neurological: Negative for headaches.  Psychiatric/Behavioral: Negative for sleep disturbance.    Objective:  BP (!) 82/54   Pulse 86   Temp (!) 97.1 F (36.2 C) (Oral)   Ht 5' 8"  (1.727 m)   BMI 24.33 kg/m   BP Readings from Last 3 Encounters:  10/24/17 (!) 82/54  08/15/17 118/67  05/19/17 (!) 156/104    Wt Readings from Last 3 Encounters:  10/16/17 160 lb (72.6 kg)  08/28/17 160 lb (72.6 kg)  07/28/17 160 lb (72.6 kg)     Physical Exam  Constitutional: He is oriented to person, place, and time. He appears well-developed and well-nourished. No distress.  HENT:  Head: Normocephalic and atraumatic.  Right Ear: External ear normal.  Left Ear: External ear normal.  Nose: Nose normal.  Mouth/Throat: Oropharynx is clear and moist.  Eyes: Pupils are equal, round, and reactive to light. Conjunctivae and EOM are normal.  Neck: Normal range of motion. Neck supple. No thyromegaly present.  Cardiovascular: Normal rate, regular rhythm and normal heart sounds.  No murmur heard. Pulmonary/Chest: Effort normal and breath sounds normal. No respiratory distress. He has no wheezes. He has no rales.  Abdominal: Soft.  Bowel sounds are normal. He exhibits no distension. There is no tenderness.  Musculoskeletal: He exhibits deformity (RightAKA left BKA).  Lymphadenopathy:    He has no cervical adenopathy.  Neurological: He is alert and oriented to person, place, and time. He has normal reflexes.  Skin: Skin is warm and dry.  Psychiatric: He has a normal mood and affect. His behavior is normal. Judgment and thought content normal.    Diabetic Foot Exam - Simple   No data filed        Assessment & Plan:   Vernard was seen today for pain.  Diagnoses and all orders for this visit:  Uncontrolled type 2 diabetes mellitus with hyperglycemia ([place]) -     CBC with Differential/Platelet -     CMP14+EGFR -     Bayer DCA Hb A1c Waived  Hypothyroidism, unspecified type -     TSH  Essential hypertension  Acquired absence of left leg below knee (HCC)  Acute renal failure superimposed on stage 4 chronic kidney disease, unspecified acute renal failure type (HCC)  Hyperuricemia  Phantom pain after amputation of lower extremity (HCC)  Other orders -     atorvastatin (LIPITOR) 40 MG tablet; Take 1 tablet (40 mg total) by mouth daily at 6 PM. -  carvedilol (COREG) 3.125 MG tablet; Take 1 tablet (3.125 mg total) by mouth 2 (two) times daily with a meal. -     traMADol (ULTRAM) 50 MG tablet; Take 1 tablet (50 mg total) by mouth every 6 (six) hours as needed for moderate pain.   I am having Ayaansh Smail maintain his loperamide, ASPIRIN 81 PO, silver sulfADIAZINE, bacitracin, allopurinol, Insulin Glargine, levothyroxine, megestrol, meloxicam, COLCRYS, lisinopril, atorvastatin, carvedilol, and traMADol.  Meds ordered this encounter  Medications  . atorvastatin (LIPITOR) 40 MG tablet    Sig: Take 1 tablet (40 mg total) by mouth daily at 6 PM.  . carvedilol (COREG) 3.125 MG tablet    Sig: Take 1 tablet (3.125 mg total) by mouth 2 (two) times daily with a meal.    Dispense:  60 tablet    Refill:  2    . traMADol (ULTRAM) 50 MG tablet    Sig: Take 1 tablet (50 mg total) by mouth every 6 (six) hours as needed for moderate pain.    Dispense:  60 tablet    Refill:  2     Follow-up: No follow-ups on file.  [name], M.D.

## 2017-10-25 ENCOUNTER — Emergency Department (HOSPITAL_COMMUNITY): Payer: Medicaid Other

## 2017-10-25 ENCOUNTER — Other Ambulatory Visit: Payer: Self-pay

## 2017-10-25 ENCOUNTER — Inpatient Hospital Stay (HOSPITAL_COMMUNITY)
Admission: EM | Admit: 2017-10-25 | Discharge: 2017-11-03 | DRG: 240 | Disposition: A | Discharge status: Skilled Nursing Facility | Payer: Medicaid Other | Attending: Internal Medicine | Admitting: Internal Medicine

## 2017-10-25 ENCOUNTER — Encounter (HOSPITAL_COMMUNITY): Payer: Self-pay | Admitting: Emergency Medicine

## 2017-10-25 DIAGNOSIS — T8781 Dehiscence of amputation stump: Secondary | ICD-10-CM | POA: Diagnosis present

## 2017-10-25 DIAGNOSIS — Z8249 Family history of ischemic heart disease and other diseases of the circulatory system: Secondary | ICD-10-CM | POA: Diagnosis not present

## 2017-10-25 DIAGNOSIS — E1152 Type 2 diabetes mellitus with diabetic peripheral angiopathy with gangrene: Secondary | ICD-10-CM | POA: Diagnosis present

## 2017-10-25 DIAGNOSIS — Z791 Long term (current) use of non-steroidal anti-inflammatories (NSAID): Secondary | ICD-10-CM | POA: Diagnosis not present

## 2017-10-25 DIAGNOSIS — L03116 Cellulitis of left lower limb: Secondary | ICD-10-CM | POA: Diagnosis present

## 2017-10-25 DIAGNOSIS — I10 Essential (primary) hypertension: Secondary | ICD-10-CM | POA: Diagnosis not present

## 2017-10-25 DIAGNOSIS — I70262 Atherosclerosis of native arteries of extremities with gangrene, left leg: Secondary | ICD-10-CM | POA: Diagnosis present

## 2017-10-25 DIAGNOSIS — I2510 Atherosclerotic heart disease of native coronary artery without angina pectoris: Secondary | ICD-10-CM | POA: Diagnosis present

## 2017-10-25 DIAGNOSIS — E1165 Type 2 diabetes mellitus with hyperglycemia: Secondary | ICD-10-CM | POA: Diagnosis not present

## 2017-10-25 DIAGNOSIS — N179 Acute kidney failure, unspecified: Secondary | ICD-10-CM

## 2017-10-25 DIAGNOSIS — N183 Chronic kidney disease, stage 3 (moderate): Secondary | ICD-10-CM | POA: Diagnosis present

## 2017-10-25 DIAGNOSIS — E785 Hyperlipidemia, unspecified: Secondary | ICD-10-CM | POA: Diagnosis present

## 2017-10-25 DIAGNOSIS — Z89512 Acquired absence of left leg below knee: Secondary | ICD-10-CM | POA: Diagnosis not present

## 2017-10-25 DIAGNOSIS — E1122 Type 2 diabetes mellitus with diabetic chronic kidney disease: Secondary | ICD-10-CM | POA: Diagnosis present

## 2017-10-25 DIAGNOSIS — E1142 Type 2 diabetes mellitus with diabetic polyneuropathy: Secondary | ICD-10-CM | POA: Diagnosis present

## 2017-10-25 DIAGNOSIS — N184 Chronic kidney disease, stage 4 (severe): Secondary | ICD-10-CM

## 2017-10-25 DIAGNOSIS — Z833 Family history of diabetes mellitus: Secondary | ICD-10-CM | POA: Diagnosis not present

## 2017-10-25 DIAGNOSIS — E039 Hypothyroidism, unspecified: Secondary | ICD-10-CM | POA: Diagnosis present

## 2017-10-25 DIAGNOSIS — D62 Acute posthemorrhagic anemia: Secondary | ICD-10-CM | POA: Diagnosis present

## 2017-10-25 DIAGNOSIS — E872 Acidosis: Secondary | ICD-10-CM | POA: Diagnosis present

## 2017-10-25 DIAGNOSIS — Y835 Amputation of limb(s) as the cause of abnormal reaction of the patient, or of later complication, without mention of misadventure at the time of the procedure: Secondary | ICD-10-CM | POA: Diagnosis present

## 2017-10-25 DIAGNOSIS — M869 Osteomyelitis, unspecified: Secondary | ICD-10-CM

## 2017-10-25 DIAGNOSIS — Z794 Long term (current) use of insulin: Secondary | ICD-10-CM | POA: Diagnosis not present

## 2017-10-25 DIAGNOSIS — L089 Local infection of the skin and subcutaneous tissue, unspecified: Secondary | ICD-10-CM | POA: Diagnosis not present

## 2017-10-25 DIAGNOSIS — Z89611 Acquired absence of right leg above knee: Secondary | ICD-10-CM

## 2017-10-25 DIAGNOSIS — I129 Hypertensive chronic kidney disease with stage 1 through stage 4 chronic kidney disease, or unspecified chronic kidney disease: Secondary | ICD-10-CM | POA: Diagnosis present

## 2017-10-25 DIAGNOSIS — E1169 Type 2 diabetes mellitus with other specified complication: Secondary | ICD-10-CM | POA: Diagnosis present

## 2017-10-25 DIAGNOSIS — L98499 Non-pressure chronic ulcer of skin of other sites with unspecified severity: Secondary | ICD-10-CM

## 2017-10-25 DIAGNOSIS — S78112A Complete traumatic amputation at level between left hip and knee, initial encounter: Secondary | ICD-10-CM

## 2017-10-25 DIAGNOSIS — M109 Gout, unspecified: Secondary | ICD-10-CM | POA: Diagnosis present

## 2017-10-25 DIAGNOSIS — L0390 Cellulitis, unspecified: Secondary | ICD-10-CM

## 2017-10-25 DIAGNOSIS — F17210 Nicotine dependence, cigarettes, uncomplicated: Secondary | ICD-10-CM | POA: Diagnosis present

## 2017-10-25 DIAGNOSIS — T148XXA Other injury of unspecified body region, initial encounter: Secondary | ICD-10-CM | POA: Diagnosis not present

## 2017-10-25 DIAGNOSIS — IMO0002 Reserved for concepts with insufficient information to code with codable children: Secondary | ICD-10-CM | POA: Diagnosis present

## 2017-10-25 LAB — URINALYSIS, ROUTINE W REFLEX MICROSCOPIC
BILIRUBIN URINE: NEGATIVE
Glucose, UA: NEGATIVE mg/dL
Hgb urine dipstick: NEGATIVE
KETONES UR: NEGATIVE mg/dL
LEUKOCYTES UA: NEGATIVE
Nitrite: NEGATIVE
PH: 5 (ref 5.0–8.0)
PROTEIN: NEGATIVE mg/dL
Specific Gravity, Urine: 1.018 (ref 1.005–1.030)

## 2017-10-25 LAB — CMP14+EGFR
ALBUMIN: 4.2 g/dL (ref 3.6–4.8)
ALK PHOS: 78 IU/L (ref 39–117)
ALT: 14 IU/L (ref 0–44)
AST: 16 IU/L (ref 0–40)
Albumin/Globulin Ratio: 0.9 — ABNORMAL LOW (ref 1.2–2.2)
BUN / CREAT RATIO: 32 — AB (ref 10–24)
BUN: 104 mg/dL (ref 8–27)
Bilirubin Total: 0.5 mg/dL (ref 0.0–1.2)
CHLORIDE: 105 mmol/L (ref 96–106)
CO2: 11 mmol/L — AB (ref 20–29)
Calcium: 10.1 mg/dL (ref 8.6–10.2)
Creatinine, Ser: 3.28 mg/dL (ref 0.76–1.27)
GFR calc non Af Amer: 19 mL/min/{1.73_m2} — ABNORMAL LOW (ref >59)
GFR, EST AFRICAN AMERICAN: 22 mL/min/{1.73_m2} — AB (ref >59)
GLOBULIN, TOTAL: 4.8 g/dL — AB (ref 1.5–4.5)
Glucose: 73 mg/dL (ref 65–99)
POTASSIUM: 5.9 mmol/L — AB (ref 3.5–5.2)
Sodium: 136 mmol/L (ref 134–144)
Total Protein: 9 g/dL — ABNORMAL HIGH (ref 6.0–8.5)

## 2017-10-25 LAB — COMPREHENSIVE METABOLIC PANEL
ALBUMIN: 3.7 g/dL (ref 3.5–5.0)
ALK PHOS: 72 U/L (ref 38–126)
ALT: 17 U/L (ref 17–63)
ANION GAP: 10 (ref 5–15)
AST: 20 U/L (ref 15–41)
BILIRUBIN TOTAL: 1 mg/dL (ref 0.3–1.2)
BUN: 101 mg/dL — ABNORMAL HIGH (ref 6–20)
CALCIUM: 9.5 mg/dL (ref 8.9–10.3)
CO2: 16 mmol/L — ABNORMAL LOW (ref 22–32)
CREATININE: 3.03 mg/dL — AB (ref 0.61–1.24)
Chloride: 108 mmol/L (ref 101–111)
GFR calc Af Amer: 24 mL/min — ABNORMAL LOW (ref >60)
GFR calc non Af Amer: 21 mL/min — ABNORMAL LOW (ref >60)
Glucose, Bld: 95 mg/dL (ref 65–99)
Potassium: 5.2 mmol/L — ABNORMAL HIGH (ref 3.5–5.1)
Sodium: 134 mmol/L — ABNORMAL LOW (ref 135–145)
TOTAL PROTEIN: 9.3 g/dL — AB (ref 6.5–8.1)

## 2017-10-25 LAB — CBC WITH DIFFERENTIAL/PLATELET
BASOS ABS: 0.1 10*3/uL (ref 0.0–0.1)
BASOS PCT: 0 %
Basophils Absolute: 0.1 10*3/uL (ref 0.0–0.2)
Basos: 0 %
EOS (ABSOLUTE): 0.4 10*3/uL (ref 0.0–0.4)
EOS ABS: 0.6 10*3/uL (ref 0.0–0.7)
EOS PCT: 3 %
EOS: 2 %
HEMATOCRIT: 40.2 % (ref 37.5–51.0)
HEMATOCRIT: 39.7 % (ref 39.0–52.0)
HEMOGLOBIN: 13.2 g/dL (ref 13.0–17.7)
Hemoglobin: 13.4 g/dL (ref 13.0–17.0)
Immature Grans (Abs): 0.1 10*3/uL (ref 0.0–0.1)
Immature Granulocytes: 1 %
LYMPHS ABS: 2.5 10*3/uL (ref 0.7–3.1)
Lymphocytes Relative: 17 %
Lymphs Abs: 2.8 10*3/uL (ref 0.7–4.0)
Lymphs: 17 %
MCH: 28 pg (ref 26.6–33.0)
MCH: 28.1 pg (ref 26.0–34.0)
MCHC: 32.8 g/dL (ref 31.5–35.7)
MCHC: 33.8 g/dL (ref 30.0–36.0)
MCV: 85 fL (ref 79–97)
MCV: 83.2 fL (ref 78.0–100.0)
MONOCYTES: 6 %
MONOS PCT: 8 %
Monocytes Absolute: 0.9 10*3/uL (ref 0.1–0.9)
Monocytes Absolute: 1.3 10*3/uL — ABNORMAL HIGH (ref 0.1–1.0)
NEUTROS ABS: 10.9 10*3/uL — AB (ref 1.4–7.0)
NEUTROS ABS: 12.2 10*3/uL — AB (ref 1.7–7.7)
Neutrophils Relative %: 72 %
Neutrophils: 74 %
PLATELETS: 828 10*3/uL — AB (ref 150–400)
Platelets: 914 10*3/uL (ref 150–379)
RBC: 4.72 x10E6/uL (ref 4.14–5.80)
RBC: 4.77 MIL/uL (ref 4.22–5.81)
RDW: 15.4 % (ref 12.3–15.4)
RDW: 14.9 % (ref 11.5–15.5)
WBC: 14.7 10*3/uL — ABNORMAL HIGH (ref 3.4–10.8)
WBC: 16.9 10*3/uL — ABNORMAL HIGH (ref 4.0–10.5)

## 2017-10-25 LAB — TSH: TSH: 6.57 u[IU]/mL — ABNORMAL HIGH (ref 0.450–4.500)

## 2017-10-25 LAB — CBG MONITORING, ED
Glucose-Capillary: 88 mg/dL (ref 65–99)
Glucose-Capillary: 107 mg/dL — ABNORMAL HIGH (ref 65–99)

## 2017-10-25 MED ORDER — BACITRACIN ZINC 500 UNIT/GM EX OINT
TOPICAL_OINTMENT | CUTANEOUS | Status: AC
  Filled 2017-10-25: qty 0.9

## 2017-10-25 MED ORDER — HEPARIN SODIUM (PORCINE) 5000 UNIT/ML IJ SOLN
5000.0000 [IU] | Freq: Three times a day (TID) | INTRAMUSCULAR | Status: DC
  Administered 2017-10-26 – 2017-11-03 (×24): 5000 [IU] via SUBCUTANEOUS
  Filled 2017-10-25 (×26): qty 1

## 2017-10-25 MED ORDER — INSULIN ASPART 100 UNIT/ML ~~LOC~~ SOLN
0.0000 [IU] | Freq: Three times a day (TID) | SUBCUTANEOUS | Status: DC
  Administered 2017-10-26 – 2017-10-30 (×3): 1 [IU] via SUBCUTANEOUS

## 2017-10-25 MED ORDER — OXYCODONE-ACETAMINOPHEN 5-325 MG PO TABS
1.0000 | ORAL_TABLET | ORAL | Status: DC | PRN
  Administered 2017-10-25 – 2017-10-26 (×3): 1 via ORAL
  Filled 2017-10-25 (×4): qty 1

## 2017-10-25 MED ORDER — ACETAMINOPHEN 325 MG PO TABS
650.0000 mg | ORAL_TABLET | Freq: Four times a day (QID) | ORAL | Status: DC | PRN
  Administered 2017-10-26: 650 mg via ORAL
  Filled 2017-10-25: qty 2

## 2017-10-25 MED ORDER — OXYCODONE-ACETAMINOPHEN 5-325 MG PO TABS
1.0000 | ORAL_TABLET | Freq: Once | ORAL | Status: AC
  Administered 2017-10-25: 1 via ORAL
  Filled 2017-10-25: qty 1

## 2017-10-25 MED ORDER — PIPERACILLIN-TAZOBACTAM 3.375 G IVPB
3.3750 g | Freq: Three times a day (TID) | INTRAVENOUS | Status: DC
  Administered 2017-10-25 – 2017-10-26 (×2): 3.375 g via INTRAVENOUS
  Filled 2017-10-25 (×3): qty 50

## 2017-10-25 MED ORDER — INSULIN ASPART 100 UNIT/ML ~~LOC~~ SOLN
0.0000 [IU] | Freq: Every day | SUBCUTANEOUS | Status: DC
Start: 2017-10-25 — End: 2017-11-03

## 2017-10-25 MED ORDER — ACETAMINOPHEN 650 MG RE SUPP: 650.0000 mg | Freq: Four times a day (QID) | RECTAL | Status: DC | PRN

## 2017-10-25 MED ORDER — SODIUM CHLORIDE 0.9 % IV SOLN
INTRAVENOUS | Status: AC
  Administered 2017-10-26 (×2): via INTRAVENOUS

## 2017-10-25 MED ORDER — SODIUM CHLORIDE 0.9 % IV BOLUS
1000.0000 mL | Freq: Once | INTRAVENOUS | Status: AC
  Administered 2017-10-25: 1000 mL via INTRAVENOUS

## 2017-10-25 MED ORDER — VANCOMYCIN HCL IN DEXTROSE 1-5 GM/200ML-% IV SOLN
1000.0000 mg | Freq: Once | INTRAVENOUS | Status: AC
  Administered 2017-10-25: 1000 mg via INTRAVENOUS
  Filled 2017-10-25: qty 200

## 2017-10-25 MED ORDER — VANCOMYCIN HCL IN DEXTROSE 750-5 MG/150ML-% IV SOLN
750.0000 mg | INTRAVENOUS | Status: DC
  Administered 2017-10-26 – 2017-10-29 (×4): 750 mg via INTRAVENOUS
  Filled 2017-10-25 (×4): qty 150

## 2017-10-25 MED ORDER — PIPERACILLIN-TAZOBACTAM 3.375 G IVPB 30 MIN
3.3750 g | Freq: Once | INTRAVENOUS | Status: AC
  Administered 2017-10-25: 3.375 g via INTRAVENOUS
  Filled 2017-10-25: qty 50

## 2017-10-25 MED ORDER — HYDRALAZINE HCL 20 MG/ML IJ SOLN: 10.0000 mg | Freq: Four times a day (QID) | INTRAMUSCULAR | Status: DC | PRN

## 2017-10-25 NOTE — Progress Notes (Signed)
Received report from [facility] RN, [name].

## 2017-10-25 NOTE — ED Provider Notes (Signed)
[facility] EMERGENCY DEPARTMENT Provider Note   CSN: [id number] Arrival date & time: 10/25/17  1231     History   Chief Complaint Chief Complaint  Patient presents with  . Abnormal Lab    HPI Todd [name] is a 62 y.o. male.  HPI Patient with multiple medical issues presents with his mother and a friend who assist with the HPI. Patient seemingly had a diabetes for a long time, but was only diagnosed last year, and about that same time he was noted to have bilateral lower extremity ischemia, had amputation of both lower extremities. He does have known kidney disease, but no progression until recently, when he was found to have possible worsening of his kidney function. He notes that over the past 2 weeks he has been progressively weak, with anorexia, nausea, but no substantial vomiting. No diarrhea, no fever, no cough. He does have pain and drainage from his left below the knee amputation site. No clear alleviating or exacerbating factors. Yesterday, with concern of his progressive decline, he went to primary care, had labs performed, and these were noted for decreasing renal function. Past Medical History:  Diagnosis Date  . AKI (acute kidney injury) ([place])   . Constipated   . Diabetes mellitus without complication ([place])   . Diarrhea   . Elevated LFTs   . Goiter   . Gout   . Hyperlipidemia   . Hypertension   . Leukocytosis   . Reactive thrombocytosis   . Right BKA infection ([place]) 08/2016  . Right leg pain   . Sepsis due to undetermined organism ([place])   . Thyroid disease   . Wound infection after surgery 08/2016    Patient Active Problem List   Diagnosis Date Noted  . Cellulitis 10/25/2017  . Urinary incontinence 08/14/2017  . History of right above knee amputation ([place]) 09/21/2016  . Acquired absence of left leg below knee ([place]) 09/21/2016  . Transaminitis   . Renal failure 07/04/2016  . Hyperuricemia 06/21/2016  . HTN (hypertension) 01/05/2016  . Diabetes  type 2, uncontrolled ([place]) 01/05/2016  . Hypothyroidism 05/10/2011    Past Surgical History:  Procedure Laterality Date  . ABDOMINAL AORTOGRAM N/A 08/11/2016   Procedure: Abdominal Aortogram;  Surgeon: Waynetta Sandy, MD;  Location: [place] [facility];  Service: Cardiovascular;  Laterality: N/A;  . ABDOMINAL AORTOGRAM W/LOWER EXTREMITY N/A 08/15/2016   Procedure: Abdominal Aortogram w/Lower Extremity;  Surgeon: Elam Dutch, MD;  Location: [place] [facility];  Service: Cardiovascular;  Laterality: N/A;  . AMPUTATION Right 08/17/2016   Procedure: RIGHT BELOW KNEE AMPUTATION;  Surgeon: Elam Dutch, MD;  Location: [place];  Service: Vascular;  Laterality: Right;  . AMPUTATION Right 09/12/2016   Procedure: AMPUTATION ABOVE KNEE;  Surgeon: Newt Minion, MD;  Location: [place];  Service: Orthopedics;  Laterality: Right;  . AMPUTATION Left 08/12/2016   Procedure: LEFT BELOW KNEE AMPUTATION;  Surgeon: Newt Minion, MD;  Location: [place];  Service: Orthopedics;  Laterality: Left;  . APPLICATION OF WOUND VAC Right 09/12/2016   Procedure: APPLICATION OF WOUND VAC ABOVE KNEE;  Surgeon: Newt Minion, MD;  Location: [place];  Service: Orthopedics;  Laterality: Right;  . LOWER EXTREMITY ANGIOGRAPHY Bilateral 08/11/2016   Procedure: Lower Extremity Angiography;  Surgeon: Waynetta Sandy, MD;  Location: [place] [facility];  Service: Cardiovascular;  Laterality: Bilateral;  . PERIPHERAL VASCULAR BALLOON ANGIOPLASTY Left 08/11/2016   Procedure: Peripheral Vascular Balloon Angioplasty;  Surgeon: Waynetta Sandy, MD;  Location: [place] [facility];  Service: Cardiovascular;  Laterality: Left;  SFA  . THYROID SURGERY          Home Medications    Prior to Admission medications   Medication Sig Start Date End Date Taking? Authorizing Provider  allopurinol (ZYLOPRIM) 100 MG tablet Take 1 tablet (100 mg total) by mouth daily. 08/15/17   [name], MD  aspirin (ASPIRIN 81) 81 MG EC tablet  Take 1 tablet by mouth daily.     [provider]  atorvastatin (LIPITOR) 40 MG tablet Take 1 tablet (40 mg total) by mouth daily at 6 PM. 10/24/17   [name], MD  bacitracin 500 UNIT/GM ointment Apply 1 application topically daily. 07/28/17   Suzan Slick, NP  carvedilol (COREG) 3.125 MG tablet Take 1 tablet (3.125 mg total) by mouth 2 (two) times daily with a meal. 10/24/17   [name], MD  COLCRYS 0.6 MG tablet TAKE 1 TABLET BY MOUTH TWICE DAILY AS NEEDED 10/10/17   [name], MD  Insulin Glargine (LANTUS SOLOSTAR) 100 UNIT/ML Solostar Pen Inject 10 Units into the skin daily. 08/15/17   [name], MD  levothyroxine (SYNTHROID, LEVOTHROID) 75 MCG tablet Take 1 tablet (75 mcg total) by mouth daily before breakfast. 08/15/17   [name], MD  lisinopril (PRINIVIL,ZESTRIL) 40 MG tablet Take 40 mg by mouth daily.  08/30/17   [provider]  loperamide (IMODIUM) 2 MG capsule Take 4 mg by mouth as needed for diarrhea or loose stools.    [provider]  megestrol (MEGACE) 400 MG/10ML suspension Take 10 mLs (400 mg total) by mouth 2 (two) times daily. For appetite stimulation 08/15/17   [name], MD  meloxicam (MOBIC) 15 MG tablet Take 1 tablet (15 mg total) by mouth daily. 08/15/17   [name], MD  silver sulfADIAZINE (SILVADENE) 1 % cream Apply 1 application topically daily. 05/26/17   Suzan Slick, NP  traMADol (ULTRAM) 50 MG tablet Take 1 tablet (50 mg total) by mouth every 6 (six) hours as needed for moderate pain. 10/24/17   [name], MD    Family History Family History  Problem Relation Age of Onset  . Heart disease Mother   . Pneumonia Father   . Diabetes Maternal Aunt   . Diabetes Maternal Uncle     Social History Social History   Tobacco Use  . Smoking status: Current Every Day Smoker    Packs/day: 0.75    Years: 45.00    Pack years: 33.75    Types: Cigarettes    Last attempt to quit: 11/12/2014    Years since quitting:  2.9  . Smokeless tobacco: Never Used  Substance Use Topics  . Alcohol use: No    Alcohol/week: 0.0 oz    Comment: h/o heavy use; stopped drinking in 7/16  . Drug use: No     Allergies   Lisinopril   Review of Systems Review of Systems  Constitutional:       Per HPI, otherwise negative  HENT:       Per HPI, otherwise negative  Respiratory:       Per HPI, otherwise negative  Cardiovascular:       Per HPI, otherwise negative  Gastrointestinal: Positive for nausea. Negative for vomiting.  Endocrine:       Negative aside from HPI  Genitourinary:       Neg aside from HPI   Musculoskeletal:       Per HPI, otherwise negative  Skin: Positive for wound.  Neurological: Positive for weakness. Negative for syncope.     Physical Exam Updated Vital Signs BP 112/72   Pulse 84   Temp 97.8 F (36.6 C) (Oral)   Resp 18   Wt 72.6 kg (160 lb)   SpO2 100%   BMI 24.33 kg/m   Physical Exam  Constitutional: He is oriented to person, place, and time. No distress.  Ill appearing, thought not in distress adult M speaking clearly  HENT:  Head: Normocephalic and atraumatic.  Eyes: Conjunctivae and EOM are normal.  Cardiovascular: Normal rate and regular rhythm.  Pulmonary/Chest: Effort normal. No stridor. No respiratory distress.  Abdominal: He exhibits no distension.  Musculoskeletal:       Legs: Neurological: He is alert and oriented to person, place, and time.  Skin: Skin is warm and dry.  Psychiatric: He has a normal mood and affect.  Nursing note and vitals reviewed.    ED Treatments / Results  Labs (all labs ordered are listed, but only abnormal results are displayed) Labs Reviewed  COMPREHENSIVE METABOLIC PANEL - Abnormal; Notable for the following components:      Result Value   Sodium 134 (*)    Potassium 5.2 (*)    CO2 16 (*)    BUN 101 (*)    Creatinine, Ser 3.03 (*)    Total Protein 9.3 (*)    GFR calc non Af Amer 21 (*)    GFR calc Af Amer 24 (*)    All  other components within normal limits  CBC WITH DIFFERENTIAL/PLATELET - Abnormal; Notable for the following components:   WBC 16.9 (*)    Platelets 828 (*)    Neutro Abs 12.2 (*)    Monocytes Absolute 1.3 (*)    All other components within normal limits  AEROBIC CULTURE (SUPERFICIAL SPECIMEN)  URINALYSIS, ROUTINE W REFLEX MICROSCOPIC  CBG MONITORING, ED    EKG None  Radiology Dg Tibia/fibula Left  Result Date: 10/25/2017 CLINICAL DATA:  Purulence drainage from stump. Below the knee amputation last March. EXAM: LEFT TIBIA AND FIBULA - 2 VIEW COMPARISON:  None. FINDINGS: There is a mottled lucent appearance to the distal femur and remaining tibia/fibula. No discrete erosion is seen. No visible joint effusion. There is skin thickening about the stump with soft tissue gas anterior to the tibial tuberosity and at the level of the osteotomy. No opaque foreign body. Atherosclerotic calcification. IMPRESSION: 1. Stump soft tissue swelling with anterior skin irregularity and gas. Ulcer or necrotizing infection could give this radiologic appearance. 2. Mottled appearance of the distal femur and proximal tibia, extent favoring disuse osteopenia over osteomyelitis. Electronically Signed   By: Monte Fantasia M.D.   On: 10/25/2017 14:42    Procedures Procedures (including critical care time)  Medications Ordered in ED Medications  sodium chloride 0.9 % bolus 1,000 mL (1,000 mLs Intravenous New Bag/Given 10/25/17 1519)  vancomycin (VANCOCIN) IVPB 1000 mg/200 mL premix (1,000 mg Intravenous New Bag/Given 10/25/17 1554)  piperacillin-tazobactam (ZOSYN) IVPB 3.375 g (0 g Intravenous Stopped 10/25/17 1554)  oxyCODONE-acetaminophen (PERCOCET/ROXICET) 5-325 MG per tablet 1 tablet (1 tablet Oral Given 10/25/17 1533)     Initial Impression / Assessment and Plan / ED Course  I have reviewed the triage vital signs and the nursing notes.  Pertinent labs & imaging results that were available during my care of  the patient were reviewed by me and considered in my medical decision making (see chart for details).     4:11  PM Patient and family members aware of all findings, including repeat demonstration of worsening renal function. I discussed the patient's case with his orthopedist, who performed the prior amputation, we discussed the concern for deep space infection. Given this, and the patient's leukocytosis, patient will receive vancomycin, Zosyn.  Patient has received IV fluids, will receive additional analgesia, and after discussion with orthopedist, as above, and with our hospitalist colleagues, he will be transferred to our affiliated facility due to concern for wound infection and acute kidney injury.   Final Clinical Impressions(s) / ED Diagnoses   Final diagnoses:  AKI (acute kidney injury) ([place])  Wound infection     Carmin Muskrat, MD 10/25/17 [phone]

## 2017-10-25 NOTE — ED Notes (Addendum)
Pt with bilateral BKA. Noted to have ulcerated area just below left knee. Approx 50% necrotic tissue and foul odor

## 2017-10-25 NOTE — Progress Notes (Signed)
Pharmacy Antibiotic Note  Todd [name] is a 62 y.o. male admitted on 10/25/2017 with wound infection/diabetic foot ulcer.  Pharmacy has been consulted for Vancomycin and zosyn dosing.  Plan: Vancomycin 1000mg  loading dose then 750mg  IV every 24 hours.  Goal trough 10-15 mcg/mL. Zosyn 3.375g IV q8h (4 hour infusion).  F/U cxs and clinical progress Monitor V/S, labs, and levels as indicated  Weight: 160 lb (72.6 kg)  Temp (24hrs), Avg:97.8 F (36.6 C), Min:97.8 F (36.6 C), Max:97.8 F (36.6 C)  Recent Labs  Lab 10/24/17 1331 10/25/17 1358  WBC 14.7* 16.9*  CREATININE 3.28* 3.03*    Estimated Creatinine Clearance: 24.5 mL/min (A) (by C-G formula based on SCr of 3.03 mg/dL (H)).    Allergies  Allergen Reactions  . Lisinopril Other (See Comments)    Hyperkalemia / Renal failure    Antimicrobials this admission: Vancomycin 5/15 >>  Zosyn 5/15 >>   Dose adjustments this admission: N/A  Microbiology results: 5/15 BCxs: pending  MRSA PCR:   Thank you for allowing pharmacy to be a part of this patient's care.  Isac Sarna, BS [place], [place] Clinical Pharmacist Pager [phone] 10/25/2017 9:55 PM

## 2017-10-25 NOTE — ED Notes (Signed)
Pt aware of need to provide urine sample. States he cannot at this time, pt was provided with urinal

## 2017-10-25 NOTE — ED Triage Notes (Signed)
Pt sent over for abnormal labs, family states pt went to PCP yesterday and had lab work. They were called today stating pt is in kidney failure. K+ 5.9, BUN 104, Creat 3.28.

## 2017-10-25 NOTE — H&P (Signed)
TRH H&P   Patient Demographics:    Todd [name], is a 62 y.o. male  MRN: [id number]   DOB - 05/27/1956  Admit Date - 10/25/2017  Outpatient Primary MD for the patient is [name], MD  Referring MD/NP/PA:   Carmin Muskrat  Outpatient Specialists:    Dr. Sharol Given  Patient coming from:   home  Chief Complaint  Patient presents with  . Abnormal Lab      HPI:    Todd [name]  is a 62 y.o. male, w hypertension, hyperlipidemia, dm2, gout, CKD stage 3, s/p L BKA, apparently c/o skin ulcer for about 3-4 weeks with worsening pain today which led him to come to ED.  Slight yellow discharge.  Pt denies fever, chills, cp, palp, sob, n/v, diarrhea, brbpr, black stool./    In ED, pt noted to have skin ulcer and concern for deeper wound. ED contacted Dr. Sharol Given and requested consult, and Dr. Sharol Given requested admission to [facility]  I would call Sharol Given to notify him once patient arrives at [facility].   Xray Tib/ fib  IMPRESSION: 1. Stump soft tissue swelling with anterior skin irregularity and gas. Ulcer or necrotizing infection could give this radiologic appearance. 2. Mottled appearance of the distal femur and proximal tibia, extent favoring disuse osteopenia over osteomyelitis.  Wbc 16.9, Hgb 13.4, Plt 828 Na 134, K 5.2, Bun 101 Creatinine 3.03 Ast 20, Alt 17   Wound culture pending  Pt started on vanco / zosyn in ED.  Pt will be admitted for skin ulcer, / cellulitis r/o osteomyelitis Appreciate Dr. Gavin Potters input.        Review of systems:    In addition to the HPI above,   No Fever-chills, No Headache, No changes with Vision or hearing, No problems swallowing food or Liquids, No Chest pain, Cough or Shortness of Breath, No Abdominal pain, No Nausea or Vommitting, Bowel movements are regular, No Blood in stool or Urine, No dysuria,  No new joints pains-aches,  No new  weakness, tingling, numbness in any extremity, No recent weight gain or loss, No polyuria, polydypsia or polyphagia, No significant Mental Stressors.  A full 10 point Review of Systems was done, except as stated above, all other Review of Systems were negative.   With Past History of the following :    Past Medical History:  Diagnosis Date  . AKI (acute kidney injury) ([place])   . Constipated   . Diabetes mellitus without complication ([place])   . Diarrhea   . Elevated LFTs   . Goiter   . Gout   . Hyperlipidemia   . Hypertension   . Leukocytosis   . Reactive thrombocytosis   . Right BKA infection ([place]) 08/2016  . Right leg pain   . Sepsis due to undetermined organism ([place])   . Thyroid disease   . Wound infection after surgery 08/2016  Past Surgical History:  Procedure Laterality Date  . ABDOMINAL AORTOGRAM N/A 08/11/2016   Procedure: Abdominal Aortogram;  Surgeon: Waynetta Sandy, MD;  Location: [place] [facility];  Service: Cardiovascular;  Laterality: N/A;  . ABDOMINAL AORTOGRAM W/LOWER EXTREMITY N/A 08/15/2016   Procedure: Abdominal Aortogram w/Lower Extremity;  Surgeon: Elam Dutch, MD;  Location: [place] [facility];  Service: Cardiovascular;  Laterality: N/A;  . AMPUTATION Right 08/17/2016   Procedure: RIGHT BELOW KNEE AMPUTATION;  Surgeon: Elam Dutch, MD;  Location: [place];  Service: Vascular;  Laterality: Right;  . AMPUTATION Right 09/12/2016   Procedure: AMPUTATION ABOVE KNEE;  Surgeon: Newt Minion, MD;  Location: [place];  Service: Orthopedics;  Laterality: Right;  . AMPUTATION Left 08/12/2016   Procedure: LEFT BELOW KNEE AMPUTATION;  Surgeon: Newt Minion, MD;  Location: [place];  Service: Orthopedics;  Laterality: Left;  . APPLICATION OF WOUND VAC Right 09/12/2016   Procedure: APPLICATION OF WOUND VAC ABOVE KNEE;  Surgeon: Newt Minion, MD;  Location: [place];  Service: Orthopedics;  Laterality: Right;  . LOWER EXTREMITY ANGIOGRAPHY Bilateral 08/11/2016    Procedure: Lower Extremity Angiography;  Surgeon: Waynetta Sandy, MD;  Location: [place] [facility];  Service: Cardiovascular;  Laterality: Bilateral;  . PERIPHERAL VASCULAR BALLOON ANGIOPLASTY Left 08/11/2016   Procedure: Peripheral Vascular Balloon Angioplasty;  Surgeon: Waynetta Sandy, MD;  Location: [place] [facility];  Service: Cardiovascular;  Laterality: Left;  SFA  . THYROID SURGERY        Social History:     Social History   Tobacco Use  . Smoking status: Current Every Day Smoker    Packs/day: 0.75    Years: 45.00    Pack years: 33.75    Types: Cigarettes    Last attempt to quit: 11/12/2014    Years since quitting: 2.9  . Smokeless tobacco: Never Used  Substance Use Topics  . Alcohol use: No    Alcohol/week: 0.0 oz    Comment: h/o heavy use; stopped drinking in 7/16     Lives - at home  Mobility - unable to walk currently    Family History :     Family History  Problem Relation Age of Onset  . Heart disease Mother   . Pneumonia Father   . Diabetes Maternal Aunt   . Diabetes Maternal Uncle        Home Medications:   Prior to Admission medications   Medication Sig Start Date End Date Taking? Authorizing Provider  allopurinol (ZYLOPRIM) 100 MG tablet Take 1 tablet (100 mg total) by mouth daily. 08/15/17   [name], MD  aspirin (ASPIRIN 81) 81 MG EC tablet Take 1 tablet by mouth daily.     [provider]  atorvastatin (LIPITOR) 40 MG tablet Take 1 tablet (40 mg total) by mouth daily at 6 PM. 10/24/17   [name], MD  bacitracin 500 UNIT/GM ointment Apply 1 application topically daily. 07/28/17   Suzan Slick, NP  carvedilol (COREG) 3.125 MG tablet Take 1 tablet (3.125 mg total) by mouth 2 (two) times daily with a meal. 10/24/17   [name], MD  COLCRYS 0.6 MG tablet TAKE 1 TABLET BY MOUTH TWICE DAILY AS NEEDED 10/10/17   [name], MD  Insulin Glargine (LANTUS SOLOSTAR) 100 UNIT/ML Solostar Pen Inject 10 Units into  the skin daily. 08/15/17   [name], MD  levothyroxine (SYNTHROID, LEVOTHROID) 75 MCG tablet Take 1 tablet (75 mcg total) by mouth  daily before breakfast. 08/15/17   [name], MD  lisinopril (PRINIVIL,ZESTRIL) 40 MG tablet Take 40 mg by mouth daily.  08/30/17   [provider]  loperamide (IMODIUM) 2 MG capsule Take 4 mg by mouth as needed for diarrhea or loose stools.    [provider]  megestrol (MEGACE) 400 MG/10ML suspension Take 10 mLs (400 mg total) by mouth 2 (two) times daily. For appetite stimulation 08/15/17   [name], MD  meloxicam (MOBIC) 15 MG tablet Take 1 tablet (15 mg total) by mouth daily. 08/15/17   [name], MD  silver sulfADIAZINE (SILVADENE) 1 % cream Apply 1 application topically daily. 05/26/17   Suzan Slick, NP  traMADol (ULTRAM) 50 MG tablet Take 1 tablet (50 mg total) by mouth every 6 (six) hours as needed for moderate pain. 10/24/17   [name], MD     Allergies:     Allergies  Allergen Reactions  . Lisinopril Other (See Comments)    Hyperkalemia / Renal failure     Physical Exam:   Vitals  Blood pressure 130/87, pulse 84, temperature 97.8 F (36.6 C), temperature source Oral, resp. rate 17, weight 72.6 kg (160 lb), SpO2 100 %.   1. General lying in bed in NAD,    2. Normal affect and insight, Not Suicidal or Homicidal, Awake Alert, Oriented X 3.  3. No F.N deficits, ALL C.Nerves Intact, Strength 5/5 all 4 extremities, Sensation intact all 4 extremities, Plantars down going.  4. Ears and Eyes appear Normal, Conjunctivae clear, PERRLA. Moist Oral Mucosa.  5. Supple Neck, No JVD, No cervical lymphadenopathy appriciated, No Carotid Bruits.  6. Symmetrical Chest wall movement, Good air movement bilaterally, CTAB.  7. RRR, No Gallops, Rubs or Murmurs, No Parasternal Heave.  8. Positive Bowel Sounds, Abdomen Soft, No tenderness, No organomegaly appriciated,No rebound -guarding or rigidity.  9.  No Cyanosis, 7cm  oval skin ulcer on the dorum of the left BKA, slight surrounding erythema, slight yellow discharge  10. Good muscle tone,  joints appear normal , no effusions, Normal ROM.  11. No Palpable Lymph Nodes in Neck or Axillae    Data Review:    CBC Recent Labs  Lab 10/24/17 1331 10/25/17 1358  WBC 14.7* 16.9*  HGB 13.2 13.4  HCT 40.2 39.7  PLT 914* 828*  MCV 85 83.2  MCH 28.0 28.1  MCHC 32.8 33.8  RDW 15.4 14.9  LYMPHSABS 2.5 2.8  MONOABS  --  1.3*  EOSABS 0.4 0.6  BASOSABS 0.1 0.1   ------------------------------------------------------------------------------------------------------------------  Chemistries  Recent Labs  Lab 10/24/17 1331 10/25/17 1358  NA 136 134*  K 5.9* 5.2*  CL 105 108  CO2 11* 16*  GLUCOSE 73 95  BUN 104* 101*  CREATININE 3.28* 3.03*  CALCIUM 10.1 9.5  AST 16 20  ALT 14 17  ALKPHOS 78 72  BILITOT 0.5 1.0   ------------------------------------------------------------------------------------------------------------------ estimated creatinine clearance is 24.5 mL/min (A) (by C-G formula based on SCr of 3.03 mg/dL (H)). ------------------------------------------------------------------------------------------------------------------ Recent Labs    10/24/17 1331  TSH 6.570*    Coagulation profile No results for input(s): INR, PROTIME in the last 168 hours. ------------------------------------------------------------------------------------------------------------------- No results for input(s): DDIMER in the last 72 hours. -------------------------------------------------------------------------------------------------------------------  Cardiac Enzymes No results for input(s): CKMB, TROPONINI, MYOGLOBIN in the last 168 hours.  Invalid input(s): CK ------------------------------------------------------------------------------------------------------------------    Component Value Date/Time   BNP 118.6 (H) 08/03/2016 1119   BNP 37.0  07/03/2016 2143     ---------------------------------------------------------------------------------------------------------------  Urinalysis    Component Value Date/Time   COLORURINE YELLOW 05/19/2017 1943   APPEARANCEUR CLEAR 05/19/2017 1943   APPEARANCEUR Clear 05/16/2017 1201   LABSPEC 1.013 05/19/2017 1943   PHURINE 5.0 05/19/2017 1943   GLUCOSEU NEGATIVE 05/19/2017 [place] 05/19/2017 [place] 05/19/2017 1943   BILIRUBINUR Negative 05/16/2017 [place] 05/19/2017 1943   PROTEINUR NEGATIVE 05/19/2017 1943   NITRITE NEGATIVE 05/19/2017 1943   LEUKOCYTESUR NEGATIVE 05/19/2017 1943   LEUKOCYTESUR Negative 05/16/2017 1201    ----------------------------------------------------------------------------------------------------------------   Imaging Results:    Dg Tibia/fibula Left  Result Date: 10/25/2017 CLINICAL DATA:  Purulence drainage from stump. Below the knee amputation last March. EXAM: LEFT TIBIA AND FIBULA - 2 VIEW COMPARISON:  None. FINDINGS: There is a mottled lucent appearance to the distal femur and remaining tibia/fibula. No discrete erosion is seen. No visible joint effusion. There is skin thickening about the stump with soft tissue gas anterior to the tibial tuberosity and at the level of the osteotomy. No opaque foreign body. Atherosclerotic calcification. IMPRESSION: 1. Stump soft tissue swelling with anterior skin irregularity and gas. Ulcer or necrotizing infection could give this radiologic appearance. 2. Mottled appearance of the distal femur and proximal tibia, extent favoring disuse osteopenia over osteomyelitis. Electronically Signed   By: Monte Fantasia M.D.   On: 10/25/2017 14:42       Assessment & Plan:    Principal Problem:   Cellulitis Active Problems:   HTN (hypertension)   Hypothyroidism   Diabetes type 2, uncontrolled ([place])   ARF (acute renal failure) (HCC)   Skin ulcer (H[facility])    Skin  ulcer / cellulitis r/o osteomyelitis Wound culture pending ESR pending Blood culture x2 pending vanco iv , zosyn iv pharmacy to dose If ESR high, consider MRI r/o osteomyelitis  ARF Check urinalysis, urine sodium, urine creatinine, urine eosinophils Check renal ultrasound Hydrate with ns iv STOP LISINOPRIL STOP COLCHICINE STOP MELOXICAM  DM2 Cont Lantus fsbs ac and qhs, ISS  Hypothyroidism Cont levothyroxine, may need to increase dose if TSH elevated  Gout Cont allopurinol  Hypertension Cont carvedilol Hydralazine 69m iv q6h prn sbp >160  Hyperlipidemia Cont Lipitor   DVT Prophylaxis Heparin -   SCDs   AM Labs Ordered, also please review Full Orders  Family Communication: Admission, patients condition and plan of care including tests being ordered have been discussed with the patient who indicate understanding and agree with the plan and Code Status.  Code Status FULL CODE  Likely DC to  home  Condition GUARDED    Consults called:   Dr. DSharol Givencontact by ED, I would call DSharol Givento [facility]: [facility]  Time spent in minutes :45   JJani GravelM.D on 10/25/2017 at 7:04 PM  Between 7am to 7pm - Pager - [phone] After 7pm go to [email or web address] - password [email or web address] - Office  [phone]

## 2017-10-26 ENCOUNTER — Inpatient Hospital Stay (HOSPITAL_COMMUNITY): Payer: Medicaid Other

## 2017-10-26 LAB — CBC
HEMATOCRIT: 37.1 % — AB (ref 39.0–52.0)
HEMOGLOBIN: 11.9 g/dL — AB (ref 13.0–17.0)
MCH: 27.1 pg (ref 26.0–34.0)
MCHC: 32.1 g/dL (ref 30.0–36.0)
MCV: 84.5 fL (ref 78.0–100.0)
Platelets: 822 10*3/uL — ABNORMAL HIGH (ref 150–400)
RBC: 4.39 MIL/uL (ref 4.22–5.81)
RDW: 15.1 % (ref 11.5–15.5)
WBC: 15.8 10*3/uL — ABNORMAL HIGH (ref 4.0–10.5)

## 2017-10-26 LAB — BASIC METABOLIC PANEL
Anion gap: 12 (ref 5–15)
BUN: 94 mg/dL — ABNORMAL HIGH (ref 6–20)
CHLORIDE: 112 mmol/L — AB (ref 101–111)
CO2: 14 mmol/L — AB (ref 22–32)
Calcium: 9.1 mg/dL (ref 8.9–10.3)
Creatinine, Ser: 3.05 mg/dL — ABNORMAL HIGH (ref 0.61–1.24)
GFR calc non Af Amer: 20 mL/min — ABNORMAL LOW (ref >60)
GFR, EST AFRICAN AMERICAN: 24 mL/min — AB (ref >60)
Glucose, Bld: 97 mg/dL (ref 65–99)
POTASSIUM: 5.4 mmol/L — AB (ref 3.5–5.1)
SODIUM: 138 mmol/L (ref 135–145)

## 2017-10-26 LAB — MRSA PCR SCREENING: MRSA BY PCR: NEGATIVE

## 2017-10-26 LAB — GLUCOSE, CAPILLARY
GLUCOSE-CAPILLARY: 124 mg/dL — AB (ref 65–99)
GLUCOSE-CAPILLARY: 131 mg/dL — AB (ref 65–99)
Glucose-Capillary: 101 mg/dL — ABNORMAL HIGH (ref 65–99)
Glucose-Capillary: 101 mg/dL — ABNORMAL HIGH (ref 65–99)

## 2017-10-26 MED ORDER — SODIUM CHLORIDE 0.9% FLUSH
10.0000 mL | INTRAVENOUS | Status: DC | PRN
  Administered 2017-10-27: 10 mL
  Filled 2017-10-26 (×2): qty 40

## 2017-10-26 MED ORDER — ALLOPURINOL 100 MG PO TABS
100.0000 mg | ORAL_TABLET | Freq: Every day | ORAL | Status: DC
  Administered 2017-10-26 – 2017-11-03 (×8): 100 mg via ORAL
  Filled 2017-10-26 (×8): qty 1

## 2017-10-26 MED ORDER — GLUCERNA SHAKE PO LIQD
237.0000 mL | Freq: Two times a day (BID) | ORAL | Status: DC
  Administered 2017-10-27 – 2017-11-02 (×10): 237 mL via ORAL

## 2017-10-26 MED ORDER — LEVOTHYROXINE SODIUM 75 MCG PO TABS
75.0000 ug | ORAL_TABLET | Freq: Every day | ORAL | Status: DC
Start: 2017-10-26 — End: 2017-11-03
  Administered 2017-10-26 – 2017-11-03 (×9): 75 ug via ORAL
  Filled 2017-10-26 (×9): qty 1

## 2017-10-26 MED ORDER — ENSURE ENLIVE PO LIQD
237.0000 mL | Freq: Two times a day (BID) | ORAL | Status: DC
  Administered 2017-10-26: 237 mL via ORAL

## 2017-10-26 MED ORDER — CARVEDILOL 3.125 MG PO TABS
3.1250 mg | ORAL_TABLET | Freq: Two times a day (BID) | ORAL | Status: DC
  Administered 2017-10-26 – 2017-10-29 (×7): 3.125 mg via ORAL
  Filled 2017-10-26 (×7): qty 1

## 2017-10-26 MED ORDER — ASPIRIN EC 81 MG PO TBEC
81.0000 mg | DELAYED_RELEASE_TABLET | Freq: Every day | ORAL | Status: DC
  Administered 2017-10-26 – 2017-11-03 (×8): 81 mg via ORAL
  Filled 2017-10-26 (×8): qty 1

## 2017-10-26 MED ORDER — INSULIN GLARGINE 100 UNIT/ML ~~LOC~~ SOLN
10.0000 [IU] | Freq: Every day | SUBCUTANEOUS | Status: DC
  Administered 2017-10-26: 10 [IU] via SUBCUTANEOUS
  Filled 2017-10-26: qty 0.1

## 2017-10-26 MED ORDER — PREMIER PROTEIN SHAKE
11.0000 [oz_av] | Freq: Every day | ORAL | Status: DC
  Administered 2017-10-27 – 2017-11-02 (×4): 11 [oz_av] via ORAL
  Filled 2017-10-26 (×13): qty 325.31

## 2017-10-26 MED ORDER — OXYCODONE-ACETAMINOPHEN 5-325 MG PO TABS
1.0000 | ORAL_TABLET | ORAL | Status: DC | PRN
Start: 2017-10-26 — End: 2017-11-01
  Administered 2017-10-26: 1 via ORAL
  Administered 2017-10-27 (×4): 2 via ORAL
  Administered 2017-10-27: 1 via ORAL
  Administered 2017-10-28 – 2017-11-01 (×16): 2 via ORAL
  Filled 2017-10-26 (×15): qty 2
  Filled 2017-10-26: qty 1
  Filled 2017-10-26 (×5): qty 2

## 2017-10-26 MED ORDER — ADULT MULTIVITAMIN W/MINERALS CH
1.0000 | ORAL_TABLET | Freq: Every day | ORAL | Status: DC
  Administered 2017-10-26 – 2017-11-03 (×8): 1 via ORAL
  Filled 2017-10-26 (×8): qty 1

## 2017-10-26 MED ORDER — ATORVASTATIN CALCIUM 40 MG PO TABS
40.0000 mg | ORAL_TABLET | Freq: Every day | ORAL | Status: DC
  Administered 2017-10-26 – 2017-11-03 (×9): 40 mg via ORAL
  Filled 2017-10-26 (×9): qty 1

## 2017-10-26 MED ORDER — SODIUM CHLORIDE 0.9 % IV SOLN
1.0000 g | INTRAVENOUS | Status: DC
  Administered 2017-10-26 – 2017-10-29 (×4): 1 g via INTRAVENOUS
  Filled 2017-10-26 (×6): qty 10

## 2017-10-26 MED ORDER — SODIUM POLYSTYRENE SULFONATE 15 GM/60ML PO SUSP
15.0000 g | Freq: Once | ORAL | Status: AC
  Administered 2017-10-26: 15 g via ORAL
  Filled 2017-10-26: qty 60

## 2017-10-26 NOTE — Telephone Encounter (Signed)
Patients sister is wanting to make Dr. Sharol Given aware that her brother is hospitalized due to kidney failure and a wound infection. She is really wanting someone to call her back directly to talk with her about patient. Please call [name] # [phone]

## 2017-10-26 NOTE — Progress Notes (Signed)
Orthopedic Tech Progress Note Patient Details:  Magdaleno Lortie Jul 18, 1955 [id number]      Post Interventions Patient Tolerated: Well Instructions Provided: Care of device Trapeze bar patient helper  Hildred Priest 10/26/2017, 12:11 PM

## 2017-10-26 NOTE — Progress Notes (Addendum)
Initial Nutrition Assessment  DOCUMENTATION CODES:   Not applicable  INTERVENTION:  Glucerna Shake po BID, each supplement provides 220 kcal and 10 grams of protein  Premier protein daily, each supplement provides 160 calories and 30 grams of protein  MVI w/ minerals  NUTRITION DIAGNOSIS:   Inadequate oral intake related to decreased appetite as evidenced by per patient/family report, meal completion < 50%.  GOAL:   Patient will meet greater than or equal to 90% of their needs  MONITOR:   PO intake, Supplement acceptance, I & O's, Weight trends  REASON FOR ASSESSMENT:   Malnutrition Screening Tool    ASSESSMENT:   Mr. Gilardi is a 62 yo male with PMH HTN, HLD, DM2, Gout, CKD 3, R AKA, L BKA, presents with skin ulcer x3-4 weeks. Exhibits cellulitis with possible osteomyelitis of stump.  RD drawn to patient for positive MST. Spoke with Mr. Ingalsbe at bedside.  He reports not really being hungry, ate only 25% of his breakfast. 24 hr recall as follows: Breakfast: Eggs, banana, sausage Lunch: Cooked for him, usually meat, starch and a vegetable Dinner: Salad, pinto beans, potatoes and cabbage  He reports a UBW of 165-175 pounds prior to amputations (09/2016) Per chart patient was 131 pounds upon admission but previous 9 weights were stated. Unclear weight history.  Unable to diagnose malnutrition at this time. Monitor PO intake and supplement acceptance  Labs reviewed:  CBGs 124, 101 K+ 5.4  Medications reviewed and include:  Insulin NS at 172mL/hr  NUTRITION - FOCUSED PHYSICAL EXAM:    Most Recent Value  Orbital Region  No depletion  Upper Arm Region  Mild depletion  Thoracic and Lumbar Region  No depletion  Buccal Region  No depletion  Temple Region  Mild depletion  Clavicle Bone Region  Mild depletion  Clavicle and Acromion Bone Region  No depletion  Scapular Bone Region  No depletion  Dorsal Hand  No depletion  Patellar Region  No depletion  Anterior  Thigh Region  No depletion  Posterior Calf Region  No depletion       Diet Order:   Diet Order           Diet heart healthy/carb modified Room service appropriate? Yes; Fluid consistency: Thin  Diet effective now          EDUCATION NEEDS:   Education needs have been addressed  Skin:  Skin Assessment: Skin Integrity Issues: Skin Integrity Issues:: Diabetic Ulcer, Other (Comment) Diabetic Ulcer: To L leg Other: MSAD to buttocks  Last BM:  10/26/2017  Height:   Ht Readings from Last 1 Encounters:  10/26/17 5\' 8"  (1.727 m)    Weight:   Wt Readings from Last 1 Encounters:  10/26/17 131 lb 2.8 oz (59.5 kg)    Ideal Body Weight:  59.85 kg  BMI:  Body mass index is 19.94 kg/m.  Estimated Nutritional Needs:   Kcal:  1500-1800 calories  Protein:  83-95 grams (1.4-1.6g/kg)  Fluid:  >1.8L  Satira Anis. Malinda Mayden, MS, RD LDN Inpatient Clinical Dietitian Pager [phone]

## 2017-10-26 NOTE — Progress Notes (Signed)
PROGRESS NOTE    Todd [name]  ZOX:[id number] DOB: [birth year] DOA: 10/25/2017 PCP: [name], MD  Brief Narrative:Mr. Ketchum is a 62 year old male with history of diabetes mellitus type 2, hypertension, CK-MB 3, severe peripheral vascular disease status post right AKA and left BKA last year presented to the ED yesterday with worsening ulcer close to his left BKA site with increasing pain and discharge.  Assessment & Plan:    BKA stump wound with secondary infection -And surrounding cellulitis -Agree with broad-spectrum antibiotics, continue vancomycin, will stop the Zosyn and start ceftriaxone -X-ray reviewed with Dr. Sharol Given who does not think he has osteomyelitis -Orthopedics Dr. Sharol Given consulted, he is leaving town this morning and will be back on Sunday, plan at this time if clinically improving on antibiotics is to discharge home with the quick outpatient follow-up with Dr. Sharol Given next week, if not reconsult on Sunday when he will be back -Follow-up ESR    AKI (acute kidney injury) ([place]) -baseline creatinine is 1.6 -worsening the setting of infection, ACE inhibitor -Lisinopril discontinued, continue IV fluids today, caution with vancomycin dosing per pharmacy  Diabetes mellitus -hemoglobin A1c is 5.8, continue sliding scale insulin, hold Lantus due to acute kidney injury and normal CBGs at this time   Severe peripheral vascular disease -Status post right AKA and left BKA -As above  History of CAD -Stable, continue aspirin, Coreg and statin    DVT prophylaxis:heparin subcutaneous Code Status: full code Family Communication:no family at bedside Disposition Plan: home pending improvement in kidney function and stump wound  Consultants:   Orthopedic started due to   Procedures:   Antimicrobials:  Antibiotics Given (last 72 hours)    Date/Time Action Medication Dose Rate   10/25/17 1522 New Bag/Given   piperacillin-tazobactam (ZOSYN) IVPB 3.375 g 3.375 g 100 mL/hr   10/25/17 1554 New Bag/Given   vancomycin (VANCOCIN) IVPB 1000 mg/200 mL premix 1,000 mg 200 mL/hr   10/25/17 2251 New Bag/Given   piperacillin-tazobactam (ZOSYN) IVPB 3.375 g 3.375 g 12.5 mL/hr   10/26/17 0605 New Bag/Given   piperacillin-tazobactam (ZOSYN) IVPB 3.375 g 3.375 g 12.5 mL/hr      Subjective: -continues to have severe pain at left BKA wound site  Objective: Vitals:   10/26/17 0100 10/26/17 0153 10/26/17 0500 10/26/17 0824  BP: 99/63  99/72 112/73  Pulse: 80  82   Resp: 17  17   Temp: 98.6 F (37 C)  97.7 F (36.5 C)   TempSrc: Oral  Oral   SpO2: 100%  100%   Weight: 59.5 kg (131 lb 2.8 oz)     Height:  _0  (1.727 m)      Intake/Output Summary (Last 24 hours) at 10/26/2017 1050 Last data filed at 10/26/2017 0900 Gross per 24 hour  Intake 120 ml  Output 150 ml  Net -30 ml   Filed Weights   10/25/17 1249 10/26/17 0100  Weight: 72.6 kg (160 lb) 59.5 kg (131 lb 2.8 oz)    Examination:  General exam: Appears calm and comfortable, debilitated, chronically ill-appearing Respiratory system: Clear to auscultation. Respiratory effort normal. Cardiovascular system: S1 & S2 heard, RRR. No JVD, murmurs, rubs, gallops Gastrointestinal system: Abdomen is nondistended, soft and nontender.Normal bowel sounds heard. Central nervous system: Alert and oriented. No focal neurological deficits. Extremities: R AKA and Left BKA with large wound close to stump Skin: ulcer as above Psychiatry: Judgement and insight appear normal. Mood & affect appropriate.     Data Reviewed:   CBC: Recent  Labs  Lab 10/24/17 1331 10/25/17 1358 10/26/17 0800  WBC 14.7* 16.9* 15.8*  NEUTROABS 10.9* 12.2*  --   HGB 13.2 13.4 11.9*  HCT 40.2 39.7 37.1*  MCV 85 83.2 84.5  PLT 914* 828* 893*   Basic Metabolic Panel: Recent Labs  Lab 10/24/17 1331 10/25/17 1358 10/26/17 0800  NA 136 134* 138  K 5.9* 5.2* 5.4*  CL 105 108 112*  CO2 11* 16* 14*  GLUCOSE 73 95 97  BUN 104* 101* 94*   CREATININE 3.28* 3.03* 3.05*  CALCIUM 10.1 9.5 9.1   GFR: Estimated Creatinine Clearance: 21.1 mL/min (A) (by C-G formula based on SCr of 3.05 mg/dL (H)). Liver Function Tests: Recent Labs  Lab 10/24/17 1331 10/25/17 1358  AST 16 20  ALT 14 17  ALKPHOS 78 72  BILITOT 0.5 1.0  PROT 9.0* 9.3*  ALBUMIN 4.2 3.7   No results for input(s): LIPASE, AMYLASE in the last 168 hours. No results for input(s): AMMONIA in the last 168 hours. Coagulation Profile: No results for input(s): INR, PROTIME in the last 168 hours. Cardiac Enzymes: No results for input(s): CKTOTAL, CKMB, CKMBINDEX, TROPONINI in the last 168 hours. BNP (last 3 results) No results for input(s): PROBNP in the last 8760 hours. HbA1C: No results for input(s): HGBA1C in the last 72 hours. CBG: Recent Labs  Lab 10/25/17 1252 10/25/17 2139 10/26/17 0149 10/26/17 0756  GLUCAP 88 107* 101* 101*   Lipid Profile: No results for input(s): CHOL, HDL, LDLCALC, TRIG, CHOLHDL, LDLDIRECT in the last 72 hours. Thyroid Function Tests: Recent Labs    10/24/17 1331  TSH 6.570*   Anemia Panel: No results for input(s): VITAMINB12, FOLATE, FERRITIN, TIBC, IRON, RETICCTPCT in the last 72 hours. Urine analysis:    Component Value Date/Time   COLORURINE YELLOW 10/25/2017 [place] 10/25/2017 1303   APPEARANCEUR Clear 05/16/2017 1201   LABSPEC 1.018 10/25/2017 1303   PHURINE 5.0 10/25/2017 1303   GLUCOSEU NEGATIVE 10/25/2017 1303   HGBUR NEGATIVE 10/25/2017 1303   BILIRUBINUR NEGATIVE 10/25/2017 1303   BILIRUBINUR Negative 05/16/2017 1201   KETONESUR NEGATIVE 10/25/2017 1303   PROTEINUR NEGATIVE 10/25/2017 1303   NITRITE NEGATIVE 10/25/2017 1303   LEUKOCYTESUR NEGATIVE 10/25/2017 1303   LEUKOCYTESUR Negative 05/16/2017 1201   Sepsis Labs: _0 (procalcitonin:4,lacticidven:4)  ) Recent Results (from the past 240 hour(s))  MRSA PCR Screening     Status: None   Collection Time: 10/26/17  1:53 AM    Result Value Ref Range Status   MRSA by PCR NEGATIVE NEGATIVE Final    Comment:        The GeneXpert MRSA Assay (FDA approved for NASAL specimens only), is one component of a comprehensive MRSA colonization surveillance program. It is not intended to diagnose MRSA infection nor to guide or monitor treatment for MRSA infections. Performed at [facility], [place] [address] [place], [place] [address]          Radiology Studies: Dg Tibia/fibula Left  Result Date: 10/25/2017 CLINICAL DATA:  Purulence drainage from stump. Below the knee amputation last March. EXAM: LEFT TIBIA AND FIBULA - 2 VIEW COMPARISON:  None. FINDINGS: There is a mottled lucent appearance to the distal femur and remaining tibia/fibula. No discrete erosion is seen. No visible joint effusion. There is skin thickening about the stump with soft tissue gas anterior to the tibial tuberosity and at the level of the osteotomy. No opaque foreign body. Atherosclerotic calcification. IMPRESSION: 1. Stump soft tissue swelling with anterior  skin irregularity and gas. Ulcer or necrotizing infection could give this radiologic appearance. 2. Mottled appearance of the distal femur and proximal tibia, extent favoring disuse osteopenia over osteomyelitis. Electronically Signed   By: Monte Fantasia M.D.   On: 10/25/2017 14:42   US Renal  Result Date: 10/26/2017 CLINICAL DATA:  Acute onset of renal failure. EXAM: RENAL / URINARY TRACT ULTRASOUND COMPLETE COMPARISON:  Renal ultrasound performed 08/31/2016 FINDINGS: Right Kidney: Length: 10.2 cm. Mildly increased parenchymal echogenicity is noted. No mass or hydronephrosis visualized. Left Kidney: Length: 10.2 cm. Mildly increased parenchymal echogenicity is noted. No mass or hydronephrosis visualized. Bladder: Appears normal for degree of bladder distention. IMPRESSION: No evidence of hydronephrosis. Mildly increased renal parenchymal echogenicity may reflect medical renal disease.  Electronically Signed   By: Garald Balding M.D.   On: 10/26/2017 02:39        Scheduled Meds: . allopurinol  100 mg Oral Daily  . aspirin EC  81 mg Oral Daily  . atorvastatin  40 mg Oral q1800  . carvedilol  3.125 mg Oral BID WC  . feeding supplement (ENSURE ENLIVE)  237 mL Oral BID BM  . heparin  5,000 Units Subcutaneous Q8H  . insulin aspart  0-5 Units Subcutaneous QHS  . insulin aspart  0-9 Units Subcutaneous TID WC  . insulin glargine  10 Units Subcutaneous Daily  . levothyroxine  75 mcg Oral QAC breakfast   Continuous Infusions: . sodium chloride 100 mL/hr at 10/26/17 0142  . piperacillin-tazobactam (ZOSYN)  IV 3.375 g (10/26/17 5681)  . vancomycin       LOS: 1 day    Time spent: 4mn    PDomenic Polite MD [facility] Page via [email or web address], password [email or web address] After 7PM please contact night-coverage  10/26/2017, 10:50 AM

## 2017-10-26 NOTE — Progress Notes (Signed)
NURSING PROGRESS NOTE  Azaiah Licciardi [id number] Admission Data: 10/26/2017 4:41 AM Attending Provider: Jani Gravel, MD CVU:[id number], [name], MD Code Status: FULL  Allergies:  Lisinopril Past Medical History:   has a past medical history of AKI (acute kidney injury) ([place]), Constipated, Diabetes mellitus without complication ([place]), Diarrhea, Elevated LFTs, Goiter, Gout, Hyperlipidemia, Hypertension, Leukocytosis, Reactive thrombocytosis, Right BKA infection ([place]) (08/2016), Right leg pain, Sepsis due to undetermined organism [facility]), Thyroid disease, and Wound infection after surgery (08/2016). Past Surgical History:   has a past surgical history that includes Thyroid surgery; Lower Extremity Angiography (Bilateral, 08/11/2016); ABDOMINAL AORTOGRAM (N/A, 08/11/2016); PERIPHERAL VASCULAR BALLOON ANGIOPLASTY (Left, 08/11/2016); ABDOMINAL AORTOGRAM W/LOWER EXTREMITY (N/A, 08/15/2016); Amputation (Right, 08/17/2016); Amputation (Right, 09/12/2016); Application if wound vac (Right, 09/12/2016); and Amputation (Left, 08/12/2016). Social History:   reports that he has been smoking cigarettes.  He has a 33.75 pack-year smoking history. He has never used smokeless tobacco. He reports that he does not drink alcohol or use drugs.  Gladys Gutman is a 62 y.o. male patient admitted from [facility] ED: via Care link   Last Documented Vital Signs: Blood pressure 99/63, pulse 80, temperature 98.6 F (37 C), temperature source Oral, resp. rate 17, height 5\' 8"  (1.727 m), weight 59.5 kg (131 lb 2.8 oz), SpO2 100 %.  Cardiac Monitoring: Box # 28 in place. Cardiac monitor yields:normal sinus rhythm.  IV Fluids:  IV in place, occlusive dsg intact without redness, IV cath antecubital left, condition patent and no redness normal saline.   Skin: Appropriate for ethnicity and dry. MASD on buttocks, open wound on left lower residual limb.  Patient orientated to room. Information packet given to patient. Admission inpatient armband  information verified with patient to include name and date of birth and placed on patient arm. Side rails up x 2, fall assessment and education completed with patient. Patient able to verbalize understanding of risk associated with falls and verbalized understanding to call for assistance before getting out of bed. Call light within reach. Patient able to voice and demonstrate understanding of unit orientation instructions.    Will continue to evaluate and treat per MD orders.  Clydell Hakim RN, BSN

## 2017-10-26 NOTE — Care Management Note (Addendum)
Case Management Note  Patient Details  Name: Todd [name] MRN: [id number] Date of Birth: 04/05/1956  Subjective/Objective:      Presents with L BKA stump wound with infection, hx of DM 2, hypertension, CK-MB 3, severe PVD status post right AKA and left BKA last year. Resides with mother, Todd [name]. Pt states owns hospital bed, wheelchair and BCS. Pt without insurance, pt states medicaid application in process for renewal.      Lamona Curl (Mother) [name] (Sister)    [phone] [id number]     PCP: [name]     11/02/2017 @12 :14  Pt s/p LEFT ABOVE KNEE AMPUTATION with vac(proveena) , 11/01/2017. PT evaluation pending, NCM to f/u with disposition needs. CSW following for possibly SNF placement.    MRI 5/19 positive for osteomyelitis...Marland KitchenMarland Kitchen Pt scheduled for surgery Doren Custard) 5/22  Action/Plan: Orthopedic MD following. Transitional care needs being monitored per NCM.  Expected Discharge Date:                  Expected Discharge Plan:  (Resides with mother)  In-House Referral:     Discharge planning Services  CM Consult  Post Acute Care Choice:    Choice offered to:  Patient  DME Arranged:  (owns wheelchair, bsc, hospital bed) DME Agency:     HH Arranged:    [place] Agency:     Status of Service:     If discussed at [employer] of Stay Meetings, dates discussed:    Additional Comments:  Sharin Mons, RN 10/26/2017, 3:00 PM

## 2017-10-27 LAB — CBC
HCT: 35 % — ABNORMAL LOW (ref 39.0–52.0)
HEMOGLOBIN: 11.3 g/dL — AB (ref 13.0–17.0)
MCH: 27.2 pg (ref 26.0–34.0)
MCHC: 32.3 g/dL (ref 30.0–36.0)
MCV: 84.1 fL (ref 78.0–100.0)
Platelets: 833 10*3/uL — ABNORMAL HIGH (ref 150–400)
RBC: 4.16 MIL/uL — ABNORMAL LOW (ref 4.22–5.81)
RDW: 14.9 % (ref 11.5–15.5)
WBC: 17.8 10*3/uL — AB (ref 4.0–10.5)

## 2017-10-27 LAB — BASIC METABOLIC PANEL
ANION GAP: 9 (ref 5–15)
BUN: 53 mg/dL — ABNORMAL HIGH (ref 6–20)
CALCIUM: 8.8 mg/dL — AB (ref 8.9–10.3)
CO2: 17 mmol/L — ABNORMAL LOW (ref 22–32)
CREATININE: 2 mg/dL — AB (ref 0.61–1.24)
Chloride: 113 mmol/L — ABNORMAL HIGH (ref 101–111)
GFR calc non Af Amer: 34 mL/min — ABNORMAL LOW (ref >60)
GFR, EST AFRICAN AMERICAN: 39 mL/min — AB (ref >60)
Glucose, Bld: 87 mg/dL (ref 65–99)
Potassium: 4.7 mmol/L (ref 3.5–5.1)
SODIUM: 139 mmol/L (ref 135–145)

## 2017-10-27 LAB — SEDIMENTATION RATE: Sed Rate: 97 mm/hr — ABNORMAL HIGH (ref 0–16)

## 2017-10-27 LAB — GLUCOSE, CAPILLARY
GLUCOSE-CAPILLARY: 138 mg/dL — AB (ref 65–99)
GLUCOSE-CAPILLARY: 83 mg/dL (ref 65–99)
GLUCOSE-CAPILLARY: 76 mg/dL (ref 65–99)
Glucose-Capillary: 103 mg/dL — ABNORMAL HIGH (ref 65–99)
Glucose-Capillary: 91 mg/dL (ref 65–99)

## 2017-10-27 LAB — HIV ANTIBODY (ROUTINE TESTING W REFLEX): HIV SCREEN 4TH GENERATION: NONREACTIVE

## 2017-10-27 MED ORDER — SODIUM CHLORIDE 0.9 % IV SOLN
INTRAVENOUS | Status: DC
  Administered 2017-10-27 – 2017-10-28 (×2): via INTRAVENOUS

## 2017-10-27 NOTE — Progress Notes (Signed)
PROGRESS NOTE    Todd [name]  FXJ:[id number] DOB: 1956/06/03 DOA: 10/25/2017 PCP: [name], MD  Brief Narrative:Todd [name] is a 62 year old male with history of diabetes mellitus type 2, hypertension, CK-MB 3, severe peripheral vascular disease status post right AKA and left BKA last year presented to the ED yesterday with worsening ulcer close to his left BKA site with increasing pain and discharge.  Assessment & Plan:    BKA stump wound with secondary infection -with surrounding cellulitis, caused by prosthesis -continue Vancomycin and ceftriaxone -check MRI of Stump -X-ray reviewed with Dr. Sharol Given who does not think he has osteomyelitis -Orthopedics Dr. Sharol Given consulted, he is out of town and will be back on Sunday, if no evidence of deep infection or osteomyelitis, recommended DC home on oral antibiotics with quick follow-up next week, if not will stay inpatient -Follow-up ESR    AKI (acute kidney injury) ([place]) -baseline creatinine is 1.6 -worsening the setting of infection, ACE inhibitor -Lisinopril discontinued, continue IV fluids today, caution with vancomycin dosing per pharmacy -Creatinine improving down to 2.0 today  Diabetes mellitus -hemoglobin A1c is 5.8, continue sliding scale insulin, hold Lantus due to acute kidney injury and normal CBGs at this time  -stable, monitor  Severe peripheral vascular disease -Status post right AKA and left BKA -As above  History of CAD -Stable, continue aspirin, Coreg and statin    DVT prophylaxis:heparin subcutaneous Code Status: full code Family Communication:no family at bedside Disposition Plan: home pending improvement in kidney function and stump wound  Consultants:   Orthopedics Dr. [name]   Procedures:   Antimicrobials:  Antibiotics Given (last 72 hours)    Date/Time Action Medication Dose Rate   10/25/17 1522 New Bag/Given   piperacillin-tazobactam (ZOSYN) IVPB 3.375 g 3.375 g 100 mL/hr   10/25/17  1554 New Bag/Given   vancomycin (VANCOCIN) IVPB 1000 mg/200 mL premix 1,000 mg 200 mL/hr   10/25/17 2251 New Bag/Given   piperacillin-tazobactam (ZOSYN) IVPB 3.375 g 3.375 g 12.5 mL/hr   10/26/17 6415 New Bag/Given   piperacillin-tazobactam (ZOSYN) IVPB 3.375 g 3.375 g 12.5 mL/hr   10/26/17 1430 New Bag/Given   cefTRIAXone (ROCEPHIN) 1 g in sodium chloride 0.9 % 100 mL IVPB 1 g 200 mL/hr   10/26/17 1552 New Bag/Given   vancomycin (VANCOCIN) IVPB 750 mg/150 ml premix 750 mg 150 mL/hr      Subjective: -feels better, one starting to improve, pain is improving as well  Objective: Vitals:   10/26/17 1430 10/26/17 1810 10/26/17 2027 10/27/17 0539  BP: 120/62 94/60 92/65  120/70  Pulse:  86 86 83  Resp:   16 16  Temp:   98.7 F (37.1 C) 98.3 F (36.8 C)  TempSrc:   Oral Oral  SpO2:   100% 100%  Weight:    59.5 kg (131 lb 2.8 oz)  Height:        Intake/Output Summary (Last 24 hours) at 10/27/2017 1108 Last data filed at 10/27/2017 0900 Gross per 24 hour  Intake 2045 ml  Output 500 ml  Net 1545 ml   Filed Weights   10/25/17 1249 10/26/17 0100 10/27/17 0539  Weight: 72.6 kg (160 lb) 59.5 kg (131 lb 2.8 oz) 59.5 kg (131 lb 2.8 oz)    Examination:  Gen: Awake, Alert, Oriented X 3, chronically ill debilitated African-American male HEENT: PERRLA, Neck supple, no JVD Lungs: Good air movement bilaterally, CTAB CVS: RRR,No Gallops,Rubs or new Murmurs Abd: soft, Non tender, non distended, BS present Extremities: right AKA,  left BKA with large circular wound with purulent base and surrounding tenderness and erythema, improving Skin: as above Psychiatry: Judgement and insight appear normal. Mood & affect appropriate.     Data Reviewed:   CBC: Recent Labs  Lab 10/24/17 1331 10/25/17 1358 10/26/17 0800 10/27/17 0806  WBC 14.7* 16.9* 15.8* 17.8*  NEUTROABS 10.9* 12.2*  --   --   HGB 13.2 13.4 11.9* 11.3*  HCT 40.2 39.7 37.1* 35.0*  MCV 85 83.2 84.5 84.1  PLT 914* 828* 822*  892*   Basic Metabolic Panel: Recent Labs  Lab 10/24/17 1331 10/25/17 1358 10/26/17 0800 10/27/17 0806  NA 136 134* 138 139  K 5.9* 5.2* 5.4* 4.7  CL 105 108 112* 113*  CO2 11* 16* 14* 17*  GLUCOSE 73 95 97 87  BUN 104* 101* 94* 53*  CREATININE 3.28* 3.03* 3.05* 2.00*  CALCIUM 10.1 9.5 9.1 8.8*   GFR: Estimated Creatinine Clearance: 32.2 mL/min (A) (by C-G formula based on SCr of 2 mg/dL (H)). Liver Function Tests: Recent Labs  Lab 10/24/17 1331 10/25/17 1358  AST 16 20  ALT 14 17  ALKPHOS 78 72  BILITOT 0.5 1.0  PROT 9.0* 9.3*  ALBUMIN 4.2 3.7   No results for input(s): LIPASE, AMYLASE in the last 168 hours. No results for input(s): AMMONIA in the last 168 hours. Coagulation Profile: No results for input(s): INR, PROTIME in the last 168 hours. Cardiac Enzymes: No results for input(s): CKTOTAL, CKMB, CKMBINDEX, TROPONINI in the last 168 hours. BNP (last 3 results) No results for input(s): PROBNP in the last 8760 hours. HbA1C: No results for input(s): HGBA1C in the last 72 hours. CBG: Recent Labs  Lab 10/26/17 0756 10/26/17 1129 10/26/17 1631 10/26/17 2155 10/27/17 0754  GLUCAP 101* 124* 138* 131* 83   Lipid Profile: No results for input(s): CHOL, HDL, LDLCALC, TRIG, CHOLHDL, LDLDIRECT in the last 72 hours. Thyroid Function Tests: Recent Labs    10/24/17 1331  TSH 6.570*   Anemia Panel: No results for input(s): VITAMINB12, FOLATE, FERRITIN, TIBC, IRON, RETICCTPCT in the last 72 hours. Urine analysis:    Component Value Date/Time   COLORURINE YELLOW 10/25/2017 [place] 10/25/2017 1303   APPEARANCEUR Clear 05/16/2017 1201   LABSPEC 1.018 10/25/2017 1303   PHURINE 5.0 10/25/2017 1303   GLUCOSEU NEGATIVE 10/25/2017 1303   HGBUR NEGATIVE 10/25/2017 1303   BILIRUBINUR NEGATIVE 10/25/2017 1303   BILIRUBINUR Negative 05/16/2017 1201   KETONESUR NEGATIVE 10/25/2017 1303   PROTEINUR NEGATIVE 10/25/2017 1303   NITRITE NEGATIVE  10/25/2017 1303   LEUKOCYTESUR NEGATIVE 10/25/2017 1303   LEUKOCYTESUR Negative 05/16/2017 1201   Sepsis Labs: @LABRCNTIP (procalcitonin:4,lacticidven:4)  ) Recent Results (from the past 240 hour(s))  MRSA PCR Screening     Status: None   Collection Time: 10/26/17  1:53 AM  Result Value Ref Range Status   MRSA by PCR NEGATIVE NEGATIVE Final    Comment:        The GeneXpert MRSA Assay (FDA approved for NASAL specimens only), is one component of a comprehensive MRSA colonization surveillance program. It is not intended to diagnose MRSA infection nor to guide or monitor treatment for MRSA infections. Performed at [facility], [place] [address], [place], [place] [address]          Radiology Studies: Dg Tibia/fibula Left  Result Date: 10/25/2017 CLINICAL DATA:  Purulence drainage from stump. Below the knee amputation last March. EXAM: LEFT TIBIA AND FIBULA - 2 VIEW COMPARISON:  None.  FINDINGS: There is a mottled lucent appearance to the distal femur and remaining tibia/fibula. No discrete erosion is seen. No visible joint effusion. There is skin thickening about the stump with soft tissue gas anterior to the tibial tuberosity and at the level of the osteotomy. No opaque foreign body. Atherosclerotic calcification. IMPRESSION: 1. Stump soft tissue swelling with anterior skin irregularity and gas. Ulcer or necrotizing infection could give this radiologic appearance. 2. Mottled appearance of the distal femur and proximal tibia, extent favoring disuse osteopenia over osteomyelitis. Electronically Signed   By: Monte Fantasia M.D.   On: 10/25/2017 14:42   US Renal  Result Date: 10/26/2017 CLINICAL DATA:  Acute onset of renal failure. EXAM: RENAL / URINARY TRACT ULTRASOUND COMPLETE COMPARISON:  Renal ultrasound performed 08/31/2016 FINDINGS: Right Kidney: Length: 10.2 cm. Mildly increased parenchymal echogenicity is noted. No mass or hydronephrosis visualized. Left Kidney: Length: 10.2  cm. Mildly increased parenchymal echogenicity is noted. No mass or hydronephrosis visualized. Bladder: Appears normal for degree of bladder distention. IMPRESSION: No evidence of hydronephrosis. Mildly increased renal parenchymal echogenicity may reflect medical renal disease. Electronically Signed   By: Garald Balding M.D.   On: 10/26/2017 02:39        Scheduled Meds: . allopurinol  100 mg Oral Daily  . aspirin EC  81 mg Oral Daily  . atorvastatin  40 mg Oral q1800  . carvedilol  3.125 mg Oral BID WC  . feeding supplement (GLUCERNA SHAKE)  237 mL Oral BID BM  . heparin  5,000 Units Subcutaneous Q8H  . insulin aspart  0-5 Units Subcutaneous QHS  . insulin aspart  0-9 Units Subcutaneous TID WC  . levothyroxine  75 mcg Oral QAC breakfast  . multivitamin with minerals  1 tablet Oral Daily  . protein supplement shake  11 oz Oral Q breakfast   Continuous Infusions: . cefTRIAXone (ROCEPHIN)  IV Stopped (10/26/17 1521)  . vancomycin Stopped (10/26/17 1652)     LOS: 2 days    Time spent: 69mn    PDomenic Polite MD [facility] Page via [email or web address], password [email or web address] After 7PM please contact night-coverage  10/27/2017, 11:08 AM

## 2017-10-27 NOTE — Telephone Encounter (Signed)
I called and have a very long discussion with the pt's sister. She is concerned about the left BKA ulcer not healing and the compliance of the pt. In the office visit in April you had suggested if the healing was still not progressing you would consider a STSG. Is this something that you still would consider. She also states that the pt is in pain and would like to discuss non narcotic treatment options for this. He is currently in the hospital with AKI and advised that when you return on monday that I would relay all this information to you and follow up to see if he is still in the hospital and call [name] to let her know of the plan of treatment for the left limb. I will hold this message till Monday.

## 2017-10-28 LAB — GLUCOSE, CAPILLARY
GLUCOSE-CAPILLARY: 103 mg/dL — AB (ref 65–99)
GLUCOSE-CAPILLARY: 100 mg/dL — AB (ref 65–99)
GLUCOSE-CAPILLARY: 104 mg/dL — AB (ref 65–99)
Glucose-Capillary: 61 mg/dL — ABNORMAL LOW (ref 65–99)
Glucose-Capillary: 93 mg/dL (ref 65–99)

## 2017-10-28 LAB — BASIC METABOLIC PANEL
Anion gap: 9 (ref 5–15)
BUN: 41 mg/dL — ABNORMAL HIGH (ref 6–20)
CO2: 13 mmol/L — AB (ref 22–32)
Calcium: 7.9 mg/dL — ABNORMAL LOW (ref 8.9–10.3)
Chloride: 118 mmol/L — ABNORMAL HIGH (ref 101–111)
Creatinine, Ser: 1.73 mg/dL — ABNORMAL HIGH (ref 0.61–1.24)
GFR calc non Af Amer: 41 mL/min — ABNORMAL LOW (ref >60)
GFR, EST AFRICAN AMERICAN: 47 mL/min — AB (ref >60)
Glucose, Bld: 125 mg/dL — ABNORMAL HIGH (ref 65–99)
Potassium: 4.1 mmol/L (ref 3.5–5.1)
Sodium: 140 mmol/L (ref 135–145)

## 2017-10-28 LAB — CBC
HEMATOCRIT: 33.6 % — AB (ref 39.0–52.0)
Hemoglobin: 11 g/dL — ABNORMAL LOW (ref 13.0–17.0)
MCH: 27.4 pg (ref 26.0–34.0)
MCHC: 32.7 g/dL (ref 30.0–36.0)
MCV: 83.8 fL (ref 78.0–100.0)
Platelets: 737 10*3/uL — ABNORMAL HIGH (ref 150–400)
RBC: 4.01 MIL/uL — ABNORMAL LOW (ref 4.22–5.81)
RDW: 15 % (ref 11.5–15.5)
WBC: 16.8 10*3/uL — ABNORMAL HIGH (ref 4.0–10.5)

## 2017-10-28 LAB — MAGNESIUM: MAGNESIUM: 1.7 mg/dL (ref 1.7–2.4)

## 2017-10-28 MED ORDER — MAGNESIUM SULFATE 2 GM/50ML IV SOLN
2.0000 g | Freq: Once | INTRAVENOUS | Status: AC
  Administered 2017-10-28: 2 g via INTRAVENOUS
  Filled 2017-10-28: qty 50

## 2017-10-28 NOTE — Progress Notes (Signed)
Hypoglycemic Event  CBG: 61  Treatment: 15 GM carbohydrate snack  Symptoms: Pale and None  Follow-up CBG: Time:857 CBG Result:103  Possible Reasons for Event: Inadequate meal intake.    Celine Ahr

## 2017-10-28 NOTE — Progress Notes (Signed)
PROGRESS NOTE    Todd [name]  RDE:[id number] DOB: 27-Aug-1955 DOA: 10/25/2017 PCP: [name], MD  Brief Narrative:Todd [name] is a 62 year old male with history of diabetes mellitus type 2, hypertension, CK-MB 3, severe peripheral vascular disease status post right AKA and left BKA last year presented to the ED yesterday with worsening ulcer close to his left BKA site with increasing pain and discharge. -Improving on Abx, MRI pending  Assessment & Plan:    BKA stump wound with secondary infection -with surrounding cellulitis, caused by prosthesis -continue Vancomycin and ceftriaxone -MRI of Stump still pending, ESR is significantly elevated at 21 -Orthopedics Dr. Sharol Given consulted, he is out of town and will be back on Sunday, if no evidence of deep infection or osteomyelitis, recommended DC home on oral antibiotics with quick follow-up next week, if not will stay inpatient -will contact Dr.Duda tomorrow    AKI (acute kidney injury) ([place]) -baseline creatinine is 1.6 -worsening the setting of infection, ACE inhibitor -Lisinopril discontinued,  -improving, stop IVF, creatinine back to baseline  Diabetes mellitus -hemoglobin A1c is 5.8, continue sliding scale insulin, hold Lantus due to acute kidney injury and normal CBGs at this time  -stable, monitor  Severe peripheral vascular disease -Status post right AKA and left BKA -As above  History of CAD -Stable, continue aspirin, Coreg and statin    DVT prophylaxis:heparin subcutaneous Code Status: full code Family Communication:no family at bedside Disposition Plan: home pending evaluation of stump wound  Consultants:   Orthopedics Dr. [name]   Procedures:   Antimicrobials:  Antibiotics Given (last 72 hours)    Date/Time Action Medication Dose Rate   10/25/17 1522 New Bag/Given   piperacillin-tazobactam (ZOSYN) IVPB 3.375 g 3.375 g 100 mL/hr   10/25/17 1554 New Bag/Given   vancomycin (VANCOCIN) IVPB 1000 mg/200  mL premix 1,000 mg 200 mL/hr   10/25/17 2251 New Bag/Given   piperacillin-tazobactam (ZOSYN) IVPB 3.375 g 3.375 g 12.5 mL/hr   10/26/17 6314 New Bag/Given   piperacillin-tazobactam (ZOSYN) IVPB 3.375 g 3.375 g 12.5 mL/hr   10/26/17 1430 New Bag/Given   cefTRIAXone (ROCEPHIN) 1 g in sodium chloride 0.9 % 100 mL IVPB 1 g 200 mL/hr   10/26/17 1552 New Bag/Given   vancomycin (VANCOCIN) IVPB 750 mg/150 ml premix 750 mg 150 mL/hr   10/27/17 1445 New Bag/Given   cefTRIAXone (ROCEPHIN) 1 g in sodium chloride 0.9 % 100 mL IVPB 1 g 200 mL/hr   10/27/17 1527 New Bag/Given   vancomycin (VANCOCIN) IVPB 750 mg/150 ml premix 750 mg 150 mL/hr      Subjective: -feels better, wants to go home, still waiting for the MRI -reports the wound starting to improve as well  Objective: Vitals:   10/27/17 1326 10/27/17 2202 10/28/17 0500 10/28/17 0823  BP: 108/77 115/69 121/79 (!) 149/87  Pulse: 83 67 78 75  Resp: 18 18 18    Temp: 97.8 F (36.6 C) 98.7 F (37.1 C) 98.5 F (36.9 C)   TempSrc: Oral     SpO2: 100% 100%    Weight:   59.1 kg (130 lb 3.2 oz)   Height:        Intake/Output Summary (Last 24 hours) at 10/28/2017 1158 Last data filed at 10/28/2017 1110 Gross per 24 hour  Intake 1050 ml  Output 350 ml  Net 700 ml   Filed Weights   10/26/17 0100 10/27/17 0539 10/28/17 0500  Weight: 59.5 kg (131 lb 2.8 oz) 59.5 kg (131 lb 2.8 oz) 59.1 kg (130  lb 3.2 oz)    Examination:  Gen: Awake, Alert, Oriented X 3, chronically ill appearing debilitated male HEENT: PERRLA, Neck supple, no JVD Lungs: Good air movement bilaterally, CTAB CVS: RRR,No Gallops,Rubs or new Murmurs Abd: soft, Non tender, non distended, BS present Extremities: right above-knee amputation, left BKA with large circular wound with black eschar and purulence around it, surrounding erythema and tenderness is improving Skin: as above Psychiatry: Judgement and insight appear normal. Mood & affect appropriate.     Data Reviewed:    CBC: Recent Labs  Lab 10/24/17 1331 10/25/17 1358 10/26/17 0800 10/27/17 0806 10/28/17 0331  WBC 14.7* 16.9* 15.8* 17.8* 16.8*  NEUTROABS 10.9* 12.2*  --   --   --   HGB 13.2 13.4 11.9* 11.3* 11.0*  HCT 40.2 39.7 37.1* 35.0* 33.6*  MCV 85 83.2 84.5 84.1 83.8  PLT 914* 828* 822* 833* 355*   Basic Metabolic Panel: Recent Labs  Lab 10/24/17 1331 10/25/17 1358 10/26/17 0800 10/27/17 0806 10/28/17 0331  NA 136 134* 138 139 140  K 5.9* 5.2* 5.4* 4.7 4.1  CL 105 108 112* 113* 118*  CO2 11* 16* 14* 17* 13*  GLUCOSE 73 95 97 87 125*  BUN 104* 101* 94* 53* 41*  CREATININE 3.28* 3.03* 3.05* 2.00* 1.73*  CALCIUM 10.1 9.5 9.1 8.8* 7.9*   GFR: Estimated Creatinine Clearance: 37 mL/min (A) (by C-G formula based on SCr of 1.73 mg/dL (H)). Liver Function Tests: Recent Labs  Lab 10/24/17 1331 10/25/17 1358  AST 16 20  ALT 14 17  ALKPHOS 78 72  BILITOT 0.5 1.0  PROT 9.0* 9.3*  ALBUMIN 4.2 3.7   No results for input(s): LIPASE, AMYLASE in the last 168 hours. No results for input(s): AMMONIA in the last 168 hours. Coagulation Profile: No results for input(s): INR, PROTIME in the last 168 hours. Cardiac Enzymes: No results for input(s): CKTOTAL, CKMB, CKMBINDEX, TROPONINI in the last 168 hours. BNP (last 3 results) No results for input(s): PROBNP in the last 8760 hours. HbA1C: No results for input(s): HGBA1C in the last 72 hours. CBG: Recent Labs  Lab 10/27/17 1209 10/27/17 1701 10/27/17 2203 10/28/17 0817 10/28/17 0857  GLUCAP 76 103* 91 61* 103*   Lipid Profile: No results for input(s): CHOL, HDL, LDLCALC, TRIG, CHOLHDL, LDLDIRECT in the last 72 hours. Thyroid Function Tests: No results for input(s): TSH, T4TOTAL, FREET4, T3FREE, THYROIDAB in the last 72 hours. Anemia Panel: No results for input(s): VITAMINB12, FOLATE, FERRITIN, TIBC, IRON, RETICCTPCT in the last 72 hours. Urine analysis:    Component Value Date/Time   COLORURINE YELLOW 10/25/2017 [place] 10/25/2017 1303   APPEARANCEUR Clear 05/16/2017 1201   LABSPEC 1.018 10/25/2017 1303   PHURINE 5.0 10/25/2017 1303   GLUCOSEU NEGATIVE 10/25/2017 1303   HGBUR NEGATIVE 10/25/2017 1303   BILIRUBINUR NEGATIVE 10/25/2017 1303   BILIRUBINUR Negative 05/16/2017 1201   KETONESUR NEGATIVE 10/25/2017 1303   PROTEINUR NEGATIVE 10/25/2017 1303   NITRITE NEGATIVE 10/25/2017 1303   LEUKOCYTESUR NEGATIVE 10/25/2017 1303   LEUKOCYTESUR Negative 05/16/2017 1201   Sepsis Labs: @LABRCNTIP (procalcitonin:4,lacticidven:4)  ) Recent Results (from the past 240 hour(s))  MRSA PCR Screening     Status: None   Collection Time: 10/26/17  1:53 AM  Result Value Ref Range Status   MRSA by PCR NEGATIVE NEGATIVE Final    Comment:        The GeneXpert MRSA Assay (FDA approved for NASAL specimens only), is one component of a comprehensive MRSA  colonization surveillance program. It is not intended to diagnose MRSA infection nor to guide or monitor treatment for MRSA infections. Performed at [facility], [place] [address], [place], [place] [address]          Radiology Studies: No results found.      Scheduled Meds: . allopurinol  100 mg Oral Daily  . aspirin EC  81 mg Oral Daily  . atorvastatin  40 mg Oral q1800  . carvedilol  3.125 mg Oral BID WC  . feeding supplement (GLUCERNA SHAKE)  237 mL Oral BID BM  . heparin  5,000 Units Subcutaneous Q8H  . insulin aspart  0-5 Units Subcutaneous QHS  . insulin aspart  0-9 Units Subcutaneous TID WC  . levothyroxine  75 mcg Oral QAC breakfast  . multivitamin with minerals  1 tablet Oral Daily  . protein supplement shake  11 oz Oral Q breakfast   Continuous Infusions: . cefTRIAXone (ROCEPHIN)  IV Stopped (10/27/17 1515)  . vancomycin Stopped (10/27/17 1627)     LOS: 3 days    Time spent: 17mn    PDomenic Polite MD [facility] Page via [email or web address], password [email or web address] After 7PM please contact  night-coverage  10/28/2017, 11:58 AM

## 2017-10-28 NOTE — Progress Notes (Signed)
Notified by CCMD that pt had an 8 beat run of V-tach. Pt assessed and pt is asymptomatic. MD Broadus John made aware. Will continue to assess.

## 2017-10-29 ENCOUNTER — Inpatient Hospital Stay (HOSPITAL_COMMUNITY): Payer: Medicaid Other

## 2017-10-29 LAB — BASIC METABOLIC PANEL
ANION GAP: 9 (ref 5–15)
BUN: 23 mg/dL — ABNORMAL HIGH (ref 6–20)
CALCIUM: 8.1 mg/dL — AB (ref 8.9–10.3)
CHLORIDE: 114 mmol/L — AB (ref 101–111)
CO2: 16 mmol/L — AB (ref 22–32)
Creatinine, Ser: 1.43 mg/dL — ABNORMAL HIGH (ref 0.61–1.24)
GFR calc non Af Amer: 51 mL/min — ABNORMAL LOW (ref >60)
GFR, EST AFRICAN AMERICAN: 59 mL/min — AB (ref >60)
Glucose, Bld: 82 mg/dL (ref 65–99)
POTASSIUM: 4 mmol/L (ref 3.5–5.1)
Sodium: 139 mmol/L (ref 135–145)

## 2017-10-29 LAB — CBC
HEMATOCRIT: 32.9 % — AB (ref 39.0–52.0)
Hemoglobin: 10.7 g/dL — ABNORMAL LOW (ref 13.0–17.0)
MCH: 27.2 pg (ref 26.0–34.0)
MCHC: 32.5 g/dL (ref 30.0–36.0)
MCV: 83.5 fL (ref 78.0–100.0)
Platelets: 784 10*3/uL — ABNORMAL HIGH (ref 150–400)
RBC: 3.94 MIL/uL — AB (ref 4.22–5.81)
RDW: 15 % (ref 11.5–15.5)
WBC: 18.5 10*3/uL — AB (ref 4.0–10.5)

## 2017-10-29 LAB — GLUCOSE, CAPILLARY
Glucose-Capillary: 82 mg/dL (ref 65–99)
Glucose-Capillary: 114 mg/dL — ABNORMAL HIGH (ref 65–99)
Glucose-Capillary: 92 mg/dL (ref 65–99)
Glucose-Capillary: 109 mg/dL — ABNORMAL HIGH (ref 65–99)

## 2017-10-29 MED ORDER — CARVEDILOL 6.25 MG PO TABS
6.2500 mg | ORAL_TABLET | Freq: Two times a day (BID) | ORAL | Status: DC
  Administered 2017-10-29 – 2017-11-03 (×11): 6.25 mg via ORAL
  Filled 2017-10-29 (×11): qty 1

## 2017-10-29 NOTE — Progress Notes (Signed)
Patient manual BP 88/58.  Notified NP X. Blount on call.  Will continue to monitor the patient and notify as needed.

## 2017-10-29 NOTE — Progress Notes (Signed)
Notified by CCMD that pt had an 7 beat run of V-tach. Pt assessed and pt is asymptomatic. MD Broadus John made aware. Will continue to assess.

## 2017-10-29 NOTE — Progress Notes (Signed)
Pharmacy Antibiotic Note  Todd [name] is a 62 y.o. male admitted on 10/25/2017 with wound infection/diabetic foot ulcer  Continues on Vancomycin and Ceftriaxone Ortho to see soon  Plan: Continue Vancomycin 750 mg iv Q 24 hours Continue Ceftriaxone 1 gram iv Q 24 hours Follow up antibiotic plan   Height: 5\' 8"  (172.7 cm) Weight: 134 lb 4.2 oz (60.9 kg) IBW/kg (Calculated) : 68.4  Temp (24hrs), Avg:98.8 F (37.1 C), Min:98.7 F (37.1 C), Max:98.9 F (37.2 C)  Recent Labs  Lab 10/25/17 1358 10/26/17 0800 10/27/17 0806 10/28/17 0331 10/29/17 0415  WBC 16.9* 15.8* 17.8* 16.8* 18.5*  CREATININE 3.03* 3.05* 2.00* 1.73* 1.43*    Estimated Creatinine Clearance: 46.1 mL/min (A) (by C-G formula based on SCr of 1.43 mg/dL (H)).    Allergies  Allergen Reactions  . Lisinopril Other (See Comments)    Hyperkalemia / Renal failure    Thank you Anette Guarneri, PharmD [phone] 10/29/2017 11:54 AM

## 2017-10-29 NOTE — Progress Notes (Signed)
PROGRESS NOTE    Todd [name]  NIO:[id number] DOB: 1955/08/19 DOA: 10/25/2017 PCP: [name], MD  Brief Narrative:Todd [name] is a 62 year old male with history of diabetes mellitus type 2, hypertension, CK-MB 3, severe peripheral vascular disease status post right AKA and left BKA last year presented to the ED yesterday with worsening ulcer close to his left BKA site with increasing pain and discharge. -Improving on Abx, MRI pending  Assessment & Plan:    BKA stump wound with secondary infection -with surrounding cellulitis, caused by prosthesis -continue Vancomycin and ceftriaxone -MRI of Stump still pending, ESR is significantly elevated at 22 -Orthopedics Dr. Sharol Given consulted, he will follow-up -unfortunately WBC count has tried up to 18,000 today, MRI to be finally done today    AKI (acute kidney injury) ([place]) -baseline creatinine is 1.6 -worsening the setting of infection, ACE inhibitor -Lisinopril discontinued,  -improving, stop IVF, creatinine back to baseline  Diabetes mellitus -hemoglobin A1c is 5.8, continue sliding scale insulin, hold Lantus due to acute kidney injury and normal CBGs at this time  -stable, monitor  Severe peripheral vascular disease -Status post right AKA and left BKA -As above  History of CAD -Stable, continue aspirin, Coreg and statin    DVT prophylaxis:heparin subcutaneous Code Status: full code Family Communication:no family at bedside Disposition Plan: home pending evaluation of stump wound  Consultants:   Orthopedics Dr. [name]   Procedures:   Antimicrobials:  Antibiotics Given (last 72 hours)    Date/Time Action Medication Dose Rate   10/26/17 1430 New Bag/Given   cefTRIAXone (ROCEPHIN) 1 g in sodium chloride 0.9 % 100 mL IVPB 1 g 200 mL/hr   10/26/17 1552 New Bag/Given   vancomycin (VANCOCIN) IVPB 750 mg/150 ml premix 750 mg 150 mL/hr   10/27/17 1445 New Bag/Given   cefTRIAXone (ROCEPHIN) 1 g in sodium chloride 0.9  % 100 mL IVPB 1 g 200 mL/hr   10/27/17 1527 New Bag/Given   vancomycin (VANCOCIN) IVPB 750 mg/150 ml premix 750 mg 150 mL/hr   10/28/17 1440 New Bag/Given   cefTRIAXone (ROCEPHIN) 1 g in sodium chloride 0.9 % 100 mL IVPB 1 g 200 mL/hr   10/28/17 1532 New Bag/Given   vancomycin (VANCOCIN) IVPB 750 mg/150 ml premix 750 mg 150 mL/hr      Subjective: -Frustrated about still waiting for MRI, wants to go home  Objective: Vitals:   10/28/17 2108 10/29/17 0619 10/29/17 0620 10/29/17 0840  BP: 129/81 119/70  115/64  Pulse: 84 71  63  Resp: 18 18    Temp: 98.7 F (37.1 C) 98.9 F (37.2 C)    TempSrc: Oral Oral    SpO2: 100% 99%    Weight:   60.9 kg (134 lb 4.2 oz)   Height:        Intake/Output Summary (Last 24 hours) at 10/29/2017 1322 Last data filed at 10/29/2017 0622 Gross per 24 hour  Intake 770 ml  Output 600 ml  Net 170 ml   Filed Weights   10/27/17 0539 10/28/17 0500 10/29/17 0620  Weight: 59.5 kg (131 lb 2.8 oz) 59.1 kg (130 lb 3.2 oz) 60.9 kg (134 lb 4.2 oz)    Examination:  Gen: Awake, Alert, Oriented X 3, chronically ill-appearing debilitated male HEENT: PERRLA, Neck supple, no JVD Lungs: clear bilaterally CVS: RRR,No Gallops,Rubs or new Murmurs Abd: soft, Non tender, non distended, BS present Extremities: right above-knee of dictation, left BKA with large 3 cm circular wound with black eschar and surrounding purulence, improving,  Skin: As above Psychiatry: Judgement and insight appear normal. Mood & affect appropriate.     Data Reviewed:   CBC: Recent Labs  Lab 10/24/17 1331 10/25/17 1358 10/26/17 0800 10/27/17 0806 10/28/17 0331 10/29/17 0415  WBC 14.7* 16.9* 15.8* 17.8* 16.8* 18.5*  NEUTROABS 10.9* 12.2*  --   --   --   --   HGB 13.2 13.4 11.9* 11.3* 11.0* 10.7*  HCT 40.2 39.7 37.1* 35.0* 33.6* 32.9*  MCV 85 83.2 84.5 84.1 83.8 83.5  PLT 914* 828* 822* 833* 737* 938*   Basic Metabolic Panel: Recent Labs  Lab 10/25/17 1358 10/26/17 0800  10/27/17 0806 10/28/17 0331 10/28/17 1534 10/29/17 0415  NA 134* 138 139 140  --  139  K 5.2* 5.4* 4.7 4.1  --  4.0  CL 108 112* 113* 118*  --  114*  CO2 16* 14* 17* 13*  --  16*  GLUCOSE 95 97 87 125*  --  82  BUN 101* 94* 53* 41*  --  23*  CREATININE 3.03* 3.05* 2.00* 1.73*  --  1.43*  CALCIUM 9.5 9.1 8.8* 7.9*  --  8.1*  MG  --   --   --   --  1.7  --    GFR: Estimated Creatinine Clearance: 46.1 mL/min (A) (by C-G formula based on SCr of 1.43 mg/dL (H)). Liver Function Tests: Recent Labs  Lab 10/24/17 1331 10/25/17 1358  AST 16 20  ALT 14 17  ALKPHOS 78 72  BILITOT 0.5 1.0  PROT 9.0* 9.3*  ALBUMIN 4.2 3.7   No results for input(s): LIPASE, AMYLASE in the last 168 hours. No results for input(s): AMMONIA in the last 168 hours. Coagulation Profile: No results for input(s): INR, PROTIME in the last 168 hours. Cardiac Enzymes: No results for input(s): CKTOTAL, CKMB, CKMBINDEX, TROPONINI in the last 168 hours. BNP (last 3 results) No results for input(s): PROBNP in the last 8760 hours. HbA1C: No results for input(s): HGBA1C in the last 72 hours. CBG: Recent Labs  Lab 10/28/17 1231 10/28/17 1740 10/28/17 2110 10/29/17 0808 10/29/17 1225  GLUCAP 100* 93 104* 82 114*   Lipid Profile: No results for input(s): CHOL, HDL, LDLCALC, TRIG, CHOLHDL, LDLDIRECT in the last 72 hours. Thyroid Function Tests: No results for input(s): TSH, T4TOTAL, FREET4, T3FREE, THYROIDAB in the last 72 hours. Anemia Panel: No results for input(s): VITAMINB12, FOLATE, FERRITIN, TIBC, IRON, RETICCTPCT in the last 72 hours. Urine analysis:    Component Value Date/Time   COLORURINE YELLOW 10/25/2017 [place] 10/25/2017 1303   APPEARANCEUR Clear 05/16/2017 1201   LABSPEC 1.018 10/25/2017 1303   PHURINE 5.0 10/25/2017 1303   GLUCOSEU NEGATIVE 10/25/2017 1303   HGBUR NEGATIVE 10/25/2017 1303   BILIRUBINUR NEGATIVE 10/25/2017 1303   BILIRUBINUR Negative 05/16/2017 1201    KETONESUR NEGATIVE 10/25/2017 1303   PROTEINUR NEGATIVE 10/25/2017 1303   NITRITE NEGATIVE 10/25/2017 1303   LEUKOCYTESUR NEGATIVE 10/25/2017 1303   LEUKOCYTESUR Negative 05/16/2017 1201   Sepsis Labs: _0 (procalcitonin:4,lacticidven:4)  ) Recent Results (from the past 240 hour(s))  MRSA PCR Screening     Status: None   Collection Time: 10/26/17  1:53 AM  Result Value Ref Range Status   MRSA by PCR NEGATIVE NEGATIVE Final    Comment:        The GeneXpert MRSA Assay (FDA approved for NASAL specimens only), is one component of a comprehensive MRSA colonization surveillance program. It is not intended to diagnose MRSA infection nor to guide  or monitor treatment for MRSA infections. Performed at [facility], [place] [address] [place], [place] [address]          Radiology Studies: No results found.      Scheduled Meds: . allopurinol  100 mg Oral Daily  . aspirin EC  81 mg Oral Daily  . atorvastatin  40 mg Oral q1800  . carvedilol  6.25 mg Oral BID WC  . feeding supplement (GLUCERNA SHAKE)  237 mL Oral BID BM  . heparin  5,000 Units Subcutaneous Q8H  . insulin aspart  0-5 Units Subcutaneous QHS  . insulin aspart  0-9 Units Subcutaneous TID WC  . levothyroxine  75 mcg Oral QAC breakfast  . multivitamin with minerals  1 tablet Oral Daily  . protein supplement shake  11 oz Oral Q breakfast   Continuous Infusions: . cefTRIAXone (ROCEPHIN)  IV Stopped (10/28/17 1518)  . vancomycin Stopped (10/28/17 1635)     LOS: 4 days    Time spent: 35mn    PDomenic Polite MD [facility] Page via [email or web address], password [email or web address] After 7PM please contact night-coverage  10/29/2017, 1:22 PM

## 2017-10-30 ENCOUNTER — Telehealth (INDEPENDENT_AMBULATORY_CARE_PROVIDER_SITE_OTHER): Payer: Self-pay | Admitting: Orthopedic Surgery

## 2017-10-30 DIAGNOSIS — E1142 Type 2 diabetes mellitus with diabetic polyneuropathy: Secondary | ICD-10-CM

## 2017-10-30 DIAGNOSIS — T8781 Dehiscence of amputation stump: Secondary | ICD-10-CM

## 2017-10-30 DIAGNOSIS — I70262 Atherosclerosis of native arteries of extremities with gangrene, left leg: Secondary | ICD-10-CM

## 2017-10-30 DIAGNOSIS — L03116 Cellulitis of left lower limb: Secondary | ICD-10-CM

## 2017-10-30 LAB — GLUCOSE, CAPILLARY
GLUCOSE-CAPILLARY: 121 mg/dL — AB (ref 65–99)
GLUCOSE-CAPILLARY: 115 mg/dL — AB (ref 65–99)
Glucose-Capillary: 76 mg/dL (ref 65–99)
Glucose-Capillary: 154 mg/dL — ABNORMAL HIGH (ref 65–99)

## 2017-10-30 MED ORDER — VANCOMYCIN HCL 10 G IV SOLR
1250.0000 mg | INTRAVENOUS | Status: AC
  Administered 2017-10-30 – 2017-11-02 (×4): 1250 mg via INTRAVENOUS
  Filled 2017-10-30 (×4): qty 1250

## 2017-10-30 MED ORDER — SODIUM CHLORIDE 0.9 % IV SOLN
2.0000 g | INTRAVENOUS | Status: AC
  Administered 2017-10-30 – 2017-11-02 (×3): 2 g via INTRAVENOUS
  Filled 2017-10-30 (×4): qty 20

## 2017-10-30 NOTE — Consult Note (Signed)
ORTHOPAEDIC CONSULTATION  REQUESTING PHYSICIAN: Todd Polite, MD  Chief Complaint: Painful gangrene and osteomyelitis left transtibial amputation.  HPI: Todd [name] is a 62 y.o. male who presents with severe peripheral vascular disease he has an above-knee amputation on the right and a below the knee amputation on the left.  Over the tibial tubercle patient has developed progressive gangrenous changes with exposed tibial tubercle with an ischemic ulcer over the residual limb as well.  Past Medical History:  Diagnosis Date  . AKI (acute kidney injury) ([place])   . Constipated   . Diabetes mellitus without complication ([place])   . Diarrhea   . Elevated LFTs   . Goiter   . Gout   . Hyperlipidemia   . Hypertension   . Leukocytosis   . Reactive thrombocytosis   . Right BKA infection ([place]) 08/2016  . Right leg pain   . Sepsis due to undetermined organism ([place])   . Thyroid disease   . Wound infection after surgery 08/2016   Past Surgical History:  Procedure Laterality Date  . ABDOMINAL AORTOGRAM N/A 08/11/2016   Procedure: Abdominal Aortogram;  Surgeon: Todd Sandy, MD;  Location: [facility];  Service: Cardiovascular;  Laterality: N/A;  . ABDOMINAL AORTOGRAM W/LOWER EXTREMITY N/A 08/15/2016   Procedure: Abdominal Aortogram w/Lower Extremity;  Surgeon: Todd Dutch, MD;  Location: [facility];  Service: Cardiovascular;  Laterality: N/A;  . AMPUTATION Right 08/17/2016   Procedure: RIGHT BELOW KNEE AMPUTATION;  Surgeon: Todd Dutch, MD;  Location: [facility];  Service: Vascular;  Laterality: Right;  . AMPUTATION Right 09/12/2016   Procedure: AMPUTATION ABOVE KNEE;  Surgeon: Todd Minion, MD;  Location: [facility];  Service: Orthopedics;  Laterality: Right;  . AMPUTATION Left 08/12/2016   Procedure: LEFT BELOW KNEE AMPUTATION;  Surgeon: Todd Minion, MD;  Location: [facility];  Service: Orthopedics;  Laterality: Left;  . APPLICATION OF WOUND VAC Right 09/12/2016   Procedure: APPLICATION OF WOUND VAC ABOVE KNEE;  Surgeon: Todd Minion, MD;  Location: [facility];  Service: Orthopedics;  Laterality: Right;  . LOWER EXTREMITY ANGIOGRAPHY Bilateral 08/11/2016   Procedure: Lower Extremity Angiography;  Surgeon: Todd Sandy, MD;  Location: [facility];  Service: Cardiovascular;  Laterality: Bilateral;  . PERIPHERAL VASCULAR BALLOON ANGIOPLASTY Left 08/11/2016   Procedure: Peripheral Vascular Balloon Angioplasty;  Surgeon: Todd Sandy, MD;  Location: [facility];  Service: Cardiovascular;  Laterality: Left;  SFA  . THYROID SURGERY     Social History   Socioeconomic History  . Marital status: Single    Spouse name: Not on file  . Number of children: Not on file  . Years of education: Not on file  . Highest education level: Not on file  Occupational History  . Occupation: drives a Visual merchandiser  . Financial resource strain: Not on file  . Food insecurity:    Worry: Not on file    Inability: Not on file  . Transportation needs:    Medical: Not on file    Non-medical: Not on file  Tobacco Use  . Smoking status: Current Every Day Smoker    Packs/day: 0.75    Years: 45.00    Pack years: 33.75    Types: Cigarettes    Last attempt to quit: 11/12/2014    Years since quitting: 2.9  . Smokeless tobacco: Never Used  Substance and Sexual Activity  . Alcohol use: No    Alcohol/week:  0.0 oz    Comment: h/o heavy use; stopped drinking in 7/16  . Drug use: No  . Sexual activity: Not Currently  Lifestyle  . Physical activity:    Days per week: Not on file    Minutes per session: Not on file  . Stress: Not on file  Relationships  . Social connections:    Talks on phone: Not on file    Gets together: Not on file    Attends religious service: Not on file    Active member of club or organization: Not on file    Attends meetings of clubs or organizations: Not on file    Relationship status: Not on file  Other Topics  Concern  . Not on file  Social History Narrative  . Not on file   Family History  Problem Relation Age of Onset  . Heart disease Mother   . Pneumonia Father   . Diabetes Maternal Aunt   . Diabetes Maternal Uncle    - negative except otherwise stated in the family history section Allergies  Allergen Reactions  . Lisinopril Other (See Comments)    Hyperkalemia / Renal failure   Prior to Admission medications   Medication Sig Start Date End Date Taking? Authorizing Provider  aspirin (ASPIRIN 81) 81 MG EC tablet Take 1 tablet by mouth daily.    Yes [provider]  carvedilol (COREG) 3.125 MG tablet Take 1 tablet (3.125 mg total) by mouth 2 (two) times daily with a meal. 10/24/17  Yes [name], Todd Gash, MD  Insulin Glargine (LANTUS SOLOSTAR) 100 UNIT/ML Solostar Pen Inject 10 Units into the skin daily. 08/15/17  Yes Todd Fraise, MD  levothyroxine (SYNTHROID, LEVOTHROID) 75 MCG tablet Take 1 tablet (75 mcg total) by mouth daily before breakfast. 08/15/17  Yes [name], Todd Gash, MD  lisinopril (PRINIVIL,ZESTRIL) 40 MG tablet Take 40 mg by mouth daily.  08/30/17  Yes [provider]  loperamide (IMODIUM) 2 MG capsule Take 4 mg by mouth as needed for diarrhea or loose stools.   Yes [provider]  megestrol (MEGACE) 400 MG/10ML suspension Take 10 mLs (400 mg total) by mouth 2 (two) times daily. For appetite stimulation Patient taking differently: Take 400 mg by mouth daily as needed (for appetite). For appetite stimulation 08/15/17  Yes [name], Todd Gash, MD  traMADol (ULTRAM) 50 MG tablet Take 1 tablet (50 mg total) by mouth every 6 (six) hours as needed for moderate pain. 10/24/17  Yes Todd Fraise, MD  atorvastatin (LIPITOR) 40 MG tablet Take 1 tablet (40 mg total) by mouth daily at 6 PM. 10/24/17   Todd Fraise, MD   Todd [name]  Result Date: 10/29/2017 CLINICAL DATA:  Left below-knee amputation stump wound. Evaluate for osteomyelitis. EXAM: MRI OF  LOWER LEFT EXTREMITY WITHOUT [name] TECHNIQUE: Multiplanar, multisequence Todd imaging of the left knee was performed. No intravenous [name] was administered. COMPARISON:  Left knee x-rays dated Oct 25, 2017. FINDINGS: Bones/Joint/Cartilage There is soft tissue ulceration over the tibial tuberosity. There is loss of the normal cortex over the anterior proximal tibia deep to the ulcer, with increased T2 marrow signal within the proximal tibial metaphysis tracking anteriorly to the tibial tuberosity and overlying ulcer. No significant knee joint effusion. No acute fracture or dislocation. Ligaments The knee ligaments and menisci are grossly intact. Muscles and Tendons Mild edema within the proximal anterior tibialis, extensor digitorum longus, and posterior tibialis muscles. Mild diffuse muscle atrophy. The extensor mechanism is intact. Soft tissues No  fluid collection or soft tissue mass. IMPRESSION: 1. Soft tissue ulceration overlying the tibial tuberosity extending to bone, with evidence of underlying proximal tibial osteomyelitis. 2. Mild edema within the proximal anterior tibialis, extensor digitorum longus, and posterior tibialis muscles along the lateral aspect of the proximal tibia. Given their proximity to the soft tissue ulcer, infectious myositis is not excluded. 3. No drainable fluid collection. Electronically Signed   By: Titus Dubin M.D.   On: 10/29/2017 14:35   - pertinent xrays, CT, MRI studies were reviewed and independently interpreted  Positive ROS: All other systems have been reviewed and were otherwise negative with the exception of those mentioned in the HPI and as above.  Physical Exam: General: Alert, no acute distress Psychiatric: Patient is competent for consent with normal mood and affect Lymphatic: No axillary or cervical lymphadenopathy Cardiovascular: No pedal edema Respiratory: No cyanosis, no use of accessory musculature GI: No organomegaly, abdomen is soft and  non-tender    Images:  @ENCIMAGES @  Labs:  Lab Results  Component Value Date   HGBA1C 6.8 (H) 08/08/2016   HGBA1C 14.3 (H) 12/21/2015   ESRSEDRATE 97 (H) 10/27/2017   ESRSEDRATE 138 (H) 09/08/2016   ESRSEDRATE 117 (H) 08/08/2016   CRP 7.1 (H) 09/08/2016   CRP 3.2 (H) 08/08/2016   LABURIC 9.5 (H) 11/11/2016   LABURIC 9.2 (H) 07/11/2016   LABURIC 11.4 (H) 07/04/2016   REPTSTATUS 09/13/2016 FINAL 09/08/2016   REPTSTATUS 09/13/2016 FINAL 09/08/2016   CULT NO GROWTH 5 DAYS 09/08/2016   CULT NO GROWTH 5 DAYS 09/08/2016    Lab Results  Component Value Date   ALBUMIN 3.7 10/25/2017   ALBUMIN 4.2 10/24/2017   ALBUMIN 4.4 08/15/2017   PREALBUMIN 8.0 (L) 08/08/2016   LABURIC 9.5 (H) 11/11/2016   LABURIC 9.2 (H) 07/11/2016   LABURIC 11.4 (H) 07/04/2016    Neurologic: Patient does not have protective sensation bilateral lower extremities.   MUSCULOSKELETAL:   Skin: Examination patient has a large gangrenous ulcer over the tibial tubercle which is 2 x 4 cm there is exposed tibia.  There is cellulitis around the ulcer.  Patient also has another ischemic ulcer over the lateral aspect of the residual limb which is 2 cm in diameter and 3 mm deep.  Patient has thin atrophic skin there is cellulitis around the knee but no cellulitis proximal thigh.  Patient has a stable right above-the-knee amputation with no wound healing issues.  Assessment: Assessment: Severe peripheral vascular disease with gangrene and osteomyelitis of the left tibial tubercle and transtibial amputation.  Plan: Plan: We will plan for left above-knee amputation on Wednesday.  Risks and benefits were discussed including risk of the wound not healing.  Patient states he understands and wishes to proceed at this time.  Thank you for the consult and the opportunity to see Todd [name], [place] [phone] 7:36 AM

## 2017-10-30 NOTE — Progress Notes (Signed)
Pharmacy Antibiotic Note  Jerry Clyne is a 62 y.o. male admitted on 10/25/2017 with BKA stump infection. Pharmacy consulted to dose vancomycin.   MRI positive for osteo - plan is for left AKA on Wed. Renal function improved, SCr down to 1.43.  Plan: Increase vancomycin to 1250 mg IV q24h Increase ceftriaxone to 2 g IV q24h for osteo Monitor renal function and clinical progress F/U post-op on Wed - may only need 24-48 hr of antibiotic post-op if get source control   Height: 5\' 8"  (172.7 cm) Weight: 134 lb 4.2 oz (60.9 kg) IBW/kg (Calculated) : 68.4  Temp (24hrs), Avg:98.7 F (37.1 C), Min:98.6 F (37 C), Max:98.8 F (37.1 C)  Recent Labs  Lab 10/25/17 1358 10/26/17 0800 10/27/17 0806 10/28/17 0331 10/29/17 0415  WBC 16.9* 15.8* 17.8* 16.8* 18.5*  CREATININE 3.03* 3.05* 2.00* 1.73* 1.43*    Estimated Creatinine Clearance: 46.1 mL/min (A) (by C-G formula based on SCr of 1.43 mg/dL (H)).    Allergies  Allergen Reactions  . Lisinopril Other (See Comments)    Hyperkalemia / Renal failure    Renold Genta, PharmD, BCPS Clinical Pharmacist Clinical phone for 10/30/2017 until 4p is [phone] After 4p, please call Main [facility] at [phone] for assistance 10/30/2017 11:52 AM

## 2017-10-30 NOTE — Telephone Encounter (Signed)
I called and sw pt's sister to advise of gangrenous/ ischemic changes to tibial tuberal and that the pt is sch for an AKA on Wednesday. She advised that she was going to explain this to her family and that she may call back with additional questions. Advised for her to let us know and we will be happy to answer any questions that she may have.

## 2017-10-30 NOTE — Telephone Encounter (Signed)
Patient's [name]) returned call. The number to contact [name] is [phone]  (work) or [phone] (cell). Please leave message if she can not answer phone

## 2017-10-30 NOTE — Telephone Encounter (Signed)
This has been done see previous message.  

## 2017-10-30 NOTE — Progress Notes (Signed)
Patient had a 4 beat run of V- tach nonsustained.  Assessed the patient.  Patient is asymptomatic.  Notified NP  X. Blount on call.  Will continue to monitor the patient.

## 2017-10-30 NOTE — Progress Notes (Signed)
Writer asked patient if he would like to have something to help with BM but he refused. He said he will let staff know when he will want to try. Will continue to monitor.

## 2017-10-30 NOTE — Telephone Encounter (Signed)
Pt bone and dead skin nothing to skin graft per Dr. Sharol Given because the skin is dead in that area and infection has gone down to the bone. I will call sister to advise pt is being put on the sch for surgery on Wednesday for an AKA.

## 2017-10-30 NOTE — Progress Notes (Signed)
PROGRESS NOTE    Todd [name]  FBP:[id number] DOB: 08/20/1955 DOA: 10/25/2017 PCP: [name], MD  Brief Narrative:Todd [name] is a 62 year old male with history of diabetes mellitus type 2, hypertension, CK-MB 3, severe peripheral vascular disease status post right AKA and left BKA last year presented to the ED yesterday with worsening ulcer close to his left BKA site with increasing pain and discharge. -Improving on Abx, MRI pending  Assessment & Plan:    BKA stump wound with secondary infection and osteomyelitis -continue Vancomycin and ceftriaxone -MRI finally completed yesterday and reviewed, ESR is significantly elevated at 78 -Orthopedics Dr. Sharol Given consulted, he will follow-up - WBC count continues to trend up -plan for Left above-knee amputation on Wednesday    AKI (acute kidney injury) ([place]) -baseline creatinine is 1.6 -worsening the setting of infection, ACE inhibitor -Lisinopril discontinued,  -improving, stop IVF, creatinine back to baseline  Diabetes mellitus -hemoglobin A1c is 5.8, continue sliding scale insulin, hold Lantus due to acute kidney injury and normal CBGs at this time  -stable, monitor  Severe peripheral vascular disease -Status post right AKA and left BKA -As above  History of CAD -Stable, continue aspirin, Coreg and statin    DVT prophylaxis:heparin subcutaneous Code Status: full code Family Communication:no family at bedside Disposition Plan: home pending evaluation of stump wound  Consultants:   Orthopedics Dr. [name]   Procedures:   Antimicrobials:  Antibiotics Given (last 72 hours)    Date/Time Action Medication Dose Rate   10/27/17 1445 New Bag/Given   cefTRIAXone (ROCEPHIN) 1 g in sodium chloride 0.9 % 100 mL IVPB 1 g 200 mL/hr   10/27/17 1527 New Bag/Given   vancomycin (VANCOCIN) IVPB 750 mg/150 ml premix 750 mg 150 mL/hr   10/28/17 1440 New Bag/Given   cefTRIAXone (ROCEPHIN) 1 g in sodium chloride 0.9 % 100 mL IVPB  1 g 200 mL/hr   10/28/17 1532 New Bag/Given   vancomycin (VANCOCIN) IVPB 750 mg/150 ml premix 750 mg 150 mL/hr   10/29/17 1439 New Bag/Given   cefTRIAXone (ROCEPHIN) 1 g in sodium chloride 0.9 % 100 mL IVPB 1 g 200 mL/hr   10/29/17 1521 New Bag/Given   vancomycin (VANCOCIN) IVPB 750 mg/150 ml premix 750 mg 150 mL/hr      Subjective: -finally saw Dr. Sharol Given this morning, scheduled for surgery on Wednesday -Feels okay, dressing change yesterday, not a lot of pain at the site  Objective: Vitals:   10/29/17 1717 10/29/17 2100 10/29/17 2216 10/30/17 0500  BP: 115/83  (!) 88/58 105/72  Pulse: 82 69  69  Resp:  17  17  Temp:  98.8 F (37.1 C)  98.6 F (37 C)  TempSrc:  Oral  Oral  SpO2:  99%  99%  Weight:    60.9 kg (134 lb 4.2 oz)  Height:        Intake/Output Summary (Last 24 hours) at 10/30/2017 1141 Last data filed at 10/30/2017 0941 Gross per 24 hour  Intake 600 ml  Output 420 ml  Net 180 ml   Filed Weights   10/28/17 0500 10/29/17 0620 10/30/17 0500  Weight: 59.1 kg (130 lb 3.2 oz) 60.9 kg (134 lb 4.2 oz) 60.9 kg (134 lb 4.2 oz)    Examination:  Gen: Awake, Alert, Oriented X 3, chronically ill-appearing, debilitated male HEENT: PERRLA, Neck supple, no JVD Lungs: Good air movement bilaterally, CTAB CVS: RRR,No Gallops,Rubs or new Murmurs Abd: soft, Non tender, non distended, BS present Extremities: right above-knee amputation, left BKA  with large 3 cm circular wound with black eschar and some purulence Skin: as above    Data Reviewed:   CBC: Recent Labs  Lab 10/24/17 1331 10/25/17 1358 10/26/17 0800 10/27/17 0806 10/28/17 0331 10/29/17 0415  WBC 14.7* 16.9* 15.8* 17.8* 16.8* 18.5*  NEUTROABS 10.9* 12.2*  --   --   --   --   HGB 13.2 13.4 11.9* 11.3* 11.0* 10.7*  HCT 40.2 39.7 37.1* 35.0* 33.6* 32.9*  MCV 85 83.2 84.5 84.1 83.8 83.5  PLT 914* 828* 822* 833* 737* 502*   Basic Metabolic Panel: Recent Labs  Lab 10/25/17 1358 10/26/17 0800  10/27/17 0806 10/28/17 0331 10/28/17 1534 10/29/17 0415  NA 134* 138 139 140  --  139  K 5.2* 5.4* 4.7 4.1  --  4.0  CL 108 112* 113* 118*  --  114*  CO2 16* 14* 17* 13*  --  16*  GLUCOSE 95 97 87 125*  --  82  BUN 101* 94* 53* 41*  --  23*  CREATININE 3.03* 3.05* 2.00* 1.73*  --  1.43*  CALCIUM 9.5 9.1 8.8* 7.9*  --  8.1*  MG  --   --   --   --  1.7  --    GFR: Estimated Creatinine Clearance: 46.1 mL/min (A) (by C-G formula based on SCr of 1.43 mg/dL (H)). Liver Function Tests: Recent Labs  Lab 10/24/17 1331 10/25/17 1358  AST 16 20  ALT 14 17  ALKPHOS 78 72  BILITOT 0.5 1.0  PROT 9.0* 9.3*  ALBUMIN 4.2 3.7   No results for input(s): LIPASE, AMYLASE in the last 168 hours. No results for input(s): AMMONIA in the last 168 hours. Coagulation Profile: No results for input(s): INR, PROTIME in the last 168 hours. Cardiac Enzymes: No results for input(s): CKTOTAL, CKMB, CKMBINDEX, TROPONINI in the last 168 hours. BNP (last 3 results) No results for input(s): PROBNP in the last 8760 hours. HbA1C: No results for input(s): HGBA1C in the last 72 hours. CBG: Recent Labs  Lab 10/29/17 0808 10/29/17 1225 10/29/17 1716 10/29/17 2159 10/30/17 0758  GLUCAP 82 114* 92 109* 76   Lipid Profile: No results for input(s): CHOL, HDL, LDLCALC, TRIG, CHOLHDL, LDLDIRECT in the last 72 hours. Thyroid Function Tests: No results for input(s): TSH, T4TOTAL, FREET4, T3FREE, THYROIDAB in the last 72 hours. Anemia Panel: No results for input(s): VITAMINB12, FOLATE, FERRITIN, TIBC, IRON, RETICCTPCT in the last 72 hours. Urine analysis:    Component Value Date/Time   COLORURINE YELLOW 10/25/2017 [place] 10/25/2017 1303   APPEARANCEUR Clear 05/16/2017 1201   LABSPEC 1.018 10/25/2017 1303   PHURINE 5.0 10/25/2017 1303   GLUCOSEU NEGATIVE 10/25/2017 1303   HGBUR NEGATIVE 10/25/2017 1303   BILIRUBINUR NEGATIVE 10/25/2017 1303   BILIRUBINUR Negative 05/16/2017 1201    KETONESUR NEGATIVE 10/25/2017 1303   PROTEINUR NEGATIVE 10/25/2017 1303   NITRITE NEGATIVE 10/25/2017 1303   LEUKOCYTESUR NEGATIVE 10/25/2017 1303   LEUKOCYTESUR Negative 05/16/2017 1201   Sepsis Labs: @LABRCNTIP (procalcitonin:4,lacticidven:4)  ) Recent Results (from the past 240 hour(s))  MRSA PCR Screening     Status: None   Collection Time: 10/26/17  1:53 AM  Result Value Ref Range Status   MRSA by PCR NEGATIVE NEGATIVE Final    Comment:        The GeneXpert MRSA Assay (FDA approved for NASAL specimens only), is one component of a comprehensive MRSA colonization surveillance program. It is not intended to diagnose MRSA infection nor to  guide or monitor treatment for MRSA infections. Performed at [facility], [place] [address], [place], [place] [address]          Radiology Studies: Mr Tibia Fibula Left Wo Contrast  Result Date: 10/29/2017 CLINICAL DATA:  Left below-knee amputation stump wound. Evaluate for osteomyelitis. EXAM: MRI OF LOWER LEFT EXTREMITY WITHOUT CONTRAST TECHNIQUE: Multiplanar, multisequence MR imaging of the left knee was performed. No intravenous contrast was administered. COMPARISON:  Left knee x-rays dated Oct 25, 2017. FINDINGS: Bones/Joint/Cartilage There is soft tissue ulceration over the tibial tuberosity. There is loss of the normal cortex over the anterior proximal tibia deep to the ulcer, with increased T2 marrow signal within the proximal tibial metaphysis tracking anteriorly to the tibial tuberosity and overlying ulcer. No significant knee joint effusion. No acute fracture or dislocation. Ligaments The knee ligaments and menisci are grossly intact. Muscles and Tendons Mild edema within the proximal anterior tibialis, extensor digitorum longus, and posterior tibialis muscles. Mild diffuse muscle atrophy. The extensor mechanism is intact. Soft tissues No fluid collection or soft tissue mass. IMPRESSION: 1. Soft tissue ulceration overlying the  tibial tuberosity extending to bone, with evidence of underlying proximal tibial osteomyelitis. 2. Mild edema within the proximal anterior tibialis, extensor digitorum longus, and posterior tibialis muscles along the lateral aspect of the proximal tibia. Given their proximity to the soft tissue ulcer, infectious myositis is not excluded. 3. No drainable fluid collection. Electronically Signed   By: Titus Dubin M.D.   On: 10/29/2017 14:35        Scheduled Meds: . allopurinol  100 mg Oral Daily  . aspirin EC  81 mg Oral Daily  . atorvastatin  40 mg Oral q1800  . carvedilol  6.25 mg Oral BID WC  . feeding supplement (GLUCERNA SHAKE)  237 mL Oral BID BM  . heparin  5,000 Units Subcutaneous Q8H  . insulin aspart  0-5 Units Subcutaneous QHS  . insulin aspart  0-9 Units Subcutaneous TID WC  . levothyroxine  75 mcg Oral QAC breakfast  . multivitamin with minerals  1 tablet Oral Daily  . protein supplement shake  11 oz Oral Q breakfast   Continuous Infusions: . cefTRIAXone (ROCEPHIN)  IV Stopped (10/29/17 1522)  . vancomycin Stopped (10/29/17 1642)     LOS: 5 days    Time spent: 59mn    PDomenic Polite MD [facility] Page via [email or web address], password [email or web address] After 7PM please contact night-coverage  10/30/2017, 11:41 AM

## 2017-10-30 NOTE — Telephone Encounter (Signed)
I called and lm on vm for pt's sister advising that Dr. Sharol Given has seen the pt and I will call her back with updated status. Advised that if I mss her and its ok to leave details on her VM I will do this with her permission. Will hold message and try to contact later on today.

## 2017-10-31 ENCOUNTER — Other Ambulatory Visit (INDEPENDENT_AMBULATORY_CARE_PROVIDER_SITE_OTHER): Payer: Self-pay | Admitting: Orthopedic Surgery

## 2017-10-31 DIAGNOSIS — M869 Osteomyelitis, unspecified: Secondary | ICD-10-CM

## 2017-10-31 LAB — GLUCOSE, CAPILLARY
GLUCOSE-CAPILLARY: 89 mg/dL (ref 65–99)
GLUCOSE-CAPILLARY: 118 mg/dL — AB (ref 65–99)
Glucose-Capillary: 131 mg/dL — ABNORMAL HIGH (ref 65–99)
Glucose-Capillary: 85 mg/dL (ref 65–99)

## 2017-10-31 MED ORDER — CHLORHEXIDINE GLUCONATE 4 % EX LIQD
60.0000 mL | Freq: Once | CUTANEOUS | Status: AC
  Administered 2017-11-01: 4 via TOPICAL
  Filled 2017-10-31: qty 60

## 2017-10-31 NOTE — Progress Notes (Signed)
Nutrition Follow-up  DOCUMENTATION CODES:   Not applicable  INTERVENTION:  Continue Glucerna Shake po TID, each supplement provides 220 kcal and 10 grams of protein  Continue Premier Protein daily, each supplement provides 160 calories and 30 grams of protein  Continue MVI w/ Minerals  NUTRITION DIAGNOSIS:   Inadequate oral intake related to decreased appetite as evidenced by per patient/family report, meal completion < 50%. -being addressed with supplements  GOAL:   Patient will meet greater than or equal to 90% of their needs -meeting  MONITOR:   PO intake, Supplement acceptance, I & O's, Weight trends   ASSESSMENT:   Todd [name] is a 62 yo male with PMH HTN, HLD, DM2, Gout, CKD 3, R AKA, L BKA, presents with skin ulcer x3-4 weeks. Exhibits cellulitis with possible osteomyelitis of stump.  Spoke with Mr. Kutz, he is eating well, had potatoes, [place] bacon, orange juice, and milk for breakfast, did not eat oatmeal. Has been drinking glucerna and premier protein shakes. No complaints from a nutrition perspective. He is scheduled for L AKA tomorrow morning due to gangrenous and ischemic changes to L stump. Will continue to monitor PO intake following.  Labs reviewed:  BUN 23  Medications reviewed and include:  Insulin  Diet Order:   Diet Order           Diet Carb Modified Fluid consistency: Thin; Room service appropriate? Yes  Diet effective now          EDUCATION NEEDS:   Education needs have been addressed  Skin:  Skin Assessment: Skin Integrity Issues: Skin Integrity Issues:: Diabetic Ulcer, Other (Comment) Diabetic Ulcer: To L leg Other: MSAD to buttocks  Last BM:  10/29/2017  Height:   Ht Readings from Last 1 Encounters:  10/26/17 5\' 8"  (1.727 m)    Weight:   Wt Readings from Last 1 Encounters:  10/31/17 140 lb 6.9 oz (63.7 kg)    Ideal Body Weight:  59.85 kg  BMI:  Body mass index is 21.35 kg/m.  Estimated Nutritional Needs:    Kcal:  1500-1800 calories  Protein:  83-95 grams (1.4-1.6g/kg)  Fluid:  >1.8L  Satira Anis. Maygen Sirico, MS, RD LDN Inpatient Clinical Dietitian Pager [phone]

## 2017-10-31 NOTE — Progress Notes (Signed)
PROGRESS NOTE    Todd [name]  TDV:[id number] DOB: 12-Oct-1955 DOA: 10/25/2017 PCP: [name], MD  Brief Narrative:Todd [name] is a 62 year old male with history of diabetes mellitus type 2, hypertension, CK-MB 3, severe peripheral vascular disease status post right AKA and left BKA last year presented to the ED  with worsening ulcer close to his left BKA site with increasing pain and discharge and AKI on CKD3. -Improving on Abx, MRI completed positive for tibial stump osteomyelitis, Ortho Dr.Duda consulting, plan for AKA tomorrow  Assessment & Plan:    BKA stump wound with secondary infection and osteomyelitis -continue Vancomycin and ceftriaxone, Stop Abx 24h after AKA -MRI finally completed and reviewed concerning for Osteomyelitis, ESR - 66 -Orthopedics Dr. Sharol Given consulted,  - WBC count continues to trend up -plan for Left above-knee amputation on Wednesday -PT consult post op    AKI (acute kidney injury) ([place]) -baseline creatinine is 1.6, creatinine was 3 on admission -worsening the setting of infection, ACE inhibitor -Lisinopril discontinued, hydrated with IVF -improved, now off IVF, creatinine back to baseline  Diabetes mellitus -hemoglobin A1c is 5.8, continue sliding scale insulin, hold Lantus due to acute kidney injury and normal CBGs at this time  -stable, monitor  Severe peripheral vascular disease -Status post right AKA and left BKA -As above  History of CAD -Stable, continue aspirin, Coreg and statin    DVT prophylaxis:heparin subcutaneous Code Status: full code Family Communication:no family at bedside Disposition Plan: home pending stability after AKA  Consultants:   Orthopedics Dr. [name]   Procedures:   Antimicrobials:  Antibiotics Given (last 72 hours)    Date/Time Action Medication Dose Rate   10/28/17 1440 New Bag/Given   cefTRIAXone (ROCEPHIN) 1 g in sodium chloride 0.9 % 100 mL IVPB 1 g 200 mL/hr   10/28/17 1532 New Bag/Given   vancomycin (VANCOCIN) IVPB 750 mg/150 ml premix 750 mg 150 mL/hr   10/29/17 1439 New Bag/Given   cefTRIAXone (ROCEPHIN) 1 g in sodium chloride 0.9 % 100 mL IVPB 1 g 200 mL/hr   10/29/17 1521 New Bag/Given   vancomycin (VANCOCIN) IVPB 750 mg/150 ml premix 750 mg 150 mL/hr   10/30/17 1415 New Bag/Given   cefTRIAXone (ROCEPHIN) 2 g in sodium chloride 0.9 % 100 mL IVPB 2 g 200 mL/hr   10/30/17 1602 New Bag/Given   vancomycin (VANCOCIN) 1,250 mg in sodium chloride 0.9 % 250 mL IVPB 1,250 mg 166.7 mL/hr      Subjective: -feels ok, waiting for surgery on Wednesday -no N/V/chest pain or dyspnea  Objective: Vitals:   10/30/17 0500 10/30/17 1352 10/30/17 2208 10/31/17 0636  BP: 105/72 114/79 118/71 117/78  Pulse: 69 80 74 83  Resp: 17 18 16 17   Temp: 98.6 F (37 C) 99.1 F (37.3 C) 98.1 F (36.7 C) 98.8 F (37.1 C)  TempSrc: Oral Oral    SpO2: 99% 100% 96% 100%  Weight: 60.9 kg (134 lb 4.2 oz)   63.7 kg (140 lb 6.9 oz)  Height:        Intake/Output Summary (Last 24 hours) at 10/31/2017 1305 Last data filed at 10/31/2017 1030 Gross per 24 hour  Intake 730 ml  Output 390 ml  Net 340 ml   Filed Weights   10/29/17 0620 10/30/17 0500 10/31/17 0636  Weight: 60.9 kg (134 lb 4.2 oz) 60.9 kg (134 lb 4.2 oz) 63.7 kg (140 lb 6.9 oz)    Examination: Gen: Awake, Alert, Oriented X 3, chronically ill appearing, debilitated male HEENT:  PERRLA, Neck supple, no JVD Lungs: Good air movement bilaterally, CTAB CVS: RRR,No Gallops,Rubs or new Murmurs Abd: soft, Non tender, non distended, BS present Extremities: right above-knee amputation, left BKA with large 3 cm circular wound with black eschar and some purulence Skin: as above    Data Reviewed:   CBC: Recent Labs  Lab 10/24/17 1331 10/25/17 1358 10/26/17 0800 10/27/17 0806 10/28/17 0331 10/29/17 0415  WBC 14.7* 16.9* 15.8* 17.8* 16.8* 18.5*  NEUTROABS 10.9* 12.2*  --   --   --   --   HGB 13.2 13.4 11.9* 11.3* 11.0* 10.7*  HCT  40.2 39.7 37.1* 35.0* 33.6* 32.9*  MCV 85 83.2 84.5 84.1 83.8 83.5  PLT 914* 828* 822* 833* 737* 967*   Basic Metabolic Panel: Recent Labs  Lab 10/25/17 1358 10/26/17 0800 10/27/17 0806 10/28/17 0331 10/28/17 1534 10/29/17 0415  NA 134* 138 139 140  --  139  K 5.2* 5.4* 4.7 4.1  --  4.0  CL 108 112* 113* 118*  --  114*  CO2 16* 14* 17* 13*  --  16*  GLUCOSE 95 97 87 125*  --  82  BUN 101* 94* 53* 41*  --  23*  CREATININE 3.03* 3.05* 2.00* 1.73*  --  1.43*  CALCIUM 9.5 9.1 8.8* 7.9*  --  8.1*  MG  --   --   --   --  1.7  --    GFR: Estimated Creatinine Clearance: 48.3 mL/min (A) (by C-G formula based on SCr of 1.43 mg/dL (H)). Liver Function Tests: Recent Labs  Lab 10/24/17 1331 10/25/17 1358  AST 16 20  ALT 14 17  ALKPHOS 78 72  BILITOT 0.5 1.0  PROT 9.0* 9.3*  ALBUMIN 4.2 3.7   No results for input(s): LIPASE, AMYLASE in the last 168 hours. No results for input(s): AMMONIA in the last 168 hours. Coagulation Profile: No results for input(s): INR, PROTIME in the last 168 hours. Cardiac Enzymes: No results for input(s): CKTOTAL, CKMB, CKMBINDEX, TROPONINI in the last 168 hours. BNP (last 3 results) No results for input(s): PROBNP in the last 8760 hours. HbA1C: No results for input(s): HGBA1C in the last 72 hours. CBG: Recent Labs  Lab 10/30/17 1142 10/30/17 1653 10/30/17 2210 10/31/17 0837 10/31/17 1238  GLUCAP 121* 115* 154* 89 131*   Lipid Profile: No results for input(s): CHOL, HDL, LDLCALC, TRIG, CHOLHDL, LDLDIRECT in the last 72 hours. Thyroid Function Tests: No results for input(s): TSH, T4TOTAL, FREET4, T3FREE, THYROIDAB in the last 72 hours. Anemia Panel: No results for input(s): VITAMINB12, FOLATE, FERRITIN, TIBC, IRON, RETICCTPCT in the last 72 hours. Urine analysis:    Component Value Date/Time   COLORURINE YELLOW 10/25/2017 [place] 10/25/2017 1303   APPEARANCEUR Clear 05/16/2017 1201   LABSPEC 1.018 10/25/2017 1303    PHURINE 5.0 10/25/2017 1303   GLUCOSEU NEGATIVE 10/25/2017 1303   HGBUR NEGATIVE 10/25/2017 1303   BILIRUBINUR NEGATIVE 10/25/2017 1303   BILIRUBINUR Negative 05/16/2017 1201   KETONESUR NEGATIVE 10/25/2017 1303   PROTEINUR NEGATIVE 10/25/2017 1303   NITRITE NEGATIVE 10/25/2017 1303   LEUKOCYTESUR NEGATIVE 10/25/2017 1303   LEUKOCYTESUR Negative 05/16/2017 1201   Sepsis Labs: @LABRCNTIP (procalcitonin:4,lacticidven:4)  ) Recent Results (from the past 240 hour(s))  MRSA PCR Screening     Status: None   Collection Time: 10/26/17  1:53 AM  Result Value Ref Range Status   MRSA by PCR NEGATIVE NEGATIVE Final    Comment:  The GeneXpert MRSA Assay (FDA approved for NASAL specimens only), is one component of a comprehensive MRSA colonization surveillance program. It is not intended to diagnose MRSA infection nor to guide or monitor treatment for MRSA infections. Performed at [facility], [place] [address] [place], [place] [address]          Radiology Studies: Mr Tibia Fibula Left Wo Contrast  Result Date: 10/29/2017 CLINICAL DATA:  Left below-knee amputation stump wound. Evaluate for osteomyelitis. EXAM: MRI OF LOWER LEFT EXTREMITY WITHOUT CONTRAST TECHNIQUE: Multiplanar, multisequence MR imaging of the left knee was performed. No intravenous contrast was administered. COMPARISON:  Left knee x-rays dated Oct 25, 2017. FINDINGS: Bones/Joint/Cartilage There is soft tissue ulceration over the tibial tuberosity. There is loss of the normal cortex over the anterior proximal tibia deep to the ulcer, with increased T2 marrow signal within the proximal tibial metaphysis tracking anteriorly to the tibial tuberosity and overlying ulcer. No significant knee joint effusion. No acute fracture or dislocation. Ligaments The knee ligaments and menisci are grossly intact. Muscles and Tendons Mild edema within the proximal anterior tibialis, extensor digitorum longus, and posterior tibialis  muscles. Mild diffuse muscle atrophy. The extensor mechanism is intact. Soft tissues No fluid collection or soft tissue mass. IMPRESSION: 1. Soft tissue ulceration overlying the tibial tuberosity extending to bone, with evidence of underlying proximal tibial osteomyelitis. 2. Mild edema within the proximal anterior tibialis, extensor digitorum longus, and posterior tibialis muscles along the lateral aspect of the proximal tibia. Given their proximity to the soft tissue ulcer, infectious myositis is not excluded. 3. No drainable fluid collection. Electronically Signed   By: Titus Dubin M.D.   On: 10/29/2017 14:35        Scheduled Meds: . allopurinol  100 mg Oral Daily  . aspirin EC  81 mg Oral Daily  . atorvastatin  40 mg Oral q1800  . carvedilol  6.25 mg Oral BID WC  . feeding supplement (GLUCERNA SHAKE)  237 mL Oral BID BM  . heparin  5,000 Units Subcutaneous Q8H  . insulin aspart  0-5 Units Subcutaneous QHS  . insulin aspart  0-9 Units Subcutaneous TID WC  . levothyroxine  75 mcg Oral QAC breakfast  . multivitamin with minerals  1 tablet Oral Daily  . protein supplement shake  11 oz Oral Q breakfast   Continuous Infusions: . cefTRIAXone (ROCEPHIN)  IV Stopped (10/30/17 1445)  . vancomycin Stopped (10/30/17 1732)     LOS: 6 days    Time spent: 81mn    PDomenic Polite MD [facility] Page via [email or web address], password [email or web address] After 7PM please contact night-coverage  10/31/2017, 1:05 PM

## 2017-11-01 ENCOUNTER — Encounter (HOSPITAL_COMMUNITY): Payer: Self-pay | Admitting: *Deleted

## 2017-11-01 ENCOUNTER — Inpatient Hospital Stay (HOSPITAL_COMMUNITY): Payer: Medicaid Other | Admitting: Anesthesiology

## 2017-11-01 ENCOUNTER — Encounter (HOSPITAL_COMMUNITY)
Admission: EM | Disposition: A | Discharge status: Skilled Nursing Facility | Payer: Self-pay | Source: Home / Self Care | Attending: Internal Medicine

## 2017-11-01 DIAGNOSIS — T8781 Dehiscence of amputation stump: Secondary | ICD-10-CM

## 2017-11-01 HISTORY — PX: AMPUTATION: SHX166

## 2017-11-01 LAB — BASIC METABOLIC PANEL
Anion gap: 12 (ref 5–15)
BUN: 13 mg/dL (ref 6–20)
CALCIUM: 8.4 mg/dL — AB (ref 8.9–10.3)
CO2: 19 mmol/L — ABNORMAL LOW (ref 22–32)
CREATININE: 1.36 mg/dL — AB (ref 0.61–1.24)
Chloride: 110 mmol/L (ref 101–111)
GFR calc Af Amer: >60 mL/min (ref >60)
GFR, EST NON AFRICAN AMERICAN: 54 mL/min — AB (ref >60)
Glucose, Bld: 92 mg/dL (ref 65–99)
Potassium: 4 mmol/L (ref 3.5–5.1)
SODIUM: 141 mmol/L (ref 135–145)

## 2017-11-01 LAB — CBC
HCT: 35.1 % — ABNORMAL LOW (ref 39.0–52.0)
Hemoglobin: 11.2 g/dL — ABNORMAL LOW (ref 13.0–17.0)
MCH: 27.1 pg (ref 26.0–34.0)
MCHC: 31.9 g/dL (ref 30.0–36.0)
MCV: 84.8 fL (ref 78.0–100.0)
PLATELETS: 709 10*3/uL — AB (ref 150–400)
RBC: 4.14 MIL/uL — ABNORMAL LOW (ref 4.22–5.81)
RDW: 15.4 % (ref 11.5–15.5)
WBC: 16.8 10*3/uL — ABNORMAL HIGH (ref 4.0–10.5)

## 2017-11-01 LAB — GLUCOSE, CAPILLARY
GLUCOSE-CAPILLARY: 87 mg/dL (ref 65–99)
GLUCOSE-CAPILLARY: 123 mg/dL — AB (ref 65–99)
Glucose-Capillary: 85 mg/dL (ref 65–99)
Glucose-Capillary: 95 mg/dL (ref 65–99)
Glucose-Capillary: 95 mg/dL (ref 65–99)

## 2017-11-01 LAB — SURGICAL PCR SCREEN
MRSA, PCR: NEGATIVE
Staphylococcus aureus: NEGATIVE

## 2017-11-01 SURGERY — AMPUTATION, ABOVE KNEE
Anesthesia: General | Site: Leg Lower | Laterality: Left

## 2017-11-01 MED ORDER — SODIUM CHLORIDE 0.9 % IV SOLN: INTRAVENOUS | Status: DC

## 2017-11-01 MED ORDER — PROPOFOL 10 MG/ML IV BOLUS
INTRAVENOUS | Status: DC | PRN
  Administered 2017-11-01: 150 mg via INTRAVENOUS
  Administered 2017-11-01: 50 mg via INTRAVENOUS

## 2017-11-01 MED ORDER — FENTANYL CITRATE (PF) 250 MCG/5ML IJ SOLN
INTRAMUSCULAR | Status: DC | PRN
  Administered 2017-11-01: 50 ug via INTRAVENOUS

## 2017-11-01 MED ORDER — PROPOFOL 10 MG/ML IV BOLUS
INTRAVENOUS | Status: AC
  Filled 2017-11-01: qty 20

## 2017-11-01 MED ORDER — MIDAZOLAM HCL 2 MG/2ML IJ SOLN
INTRAMUSCULAR | Status: DC | PRN
  Administered 2017-11-01: 2 mg via INTRAVENOUS

## 2017-11-01 MED ORDER — HYDROMORPHONE HCL 1 MG/ML IJ SOLN
0.5000 mg | INTRAMUSCULAR | Status: DC | PRN
  Administered 2017-11-01: 0.5 mg via INTRAVENOUS
  Administered 2017-11-03 (×2): 1 mg via INTRAVENOUS
  Filled 2017-11-01 (×2): qty 1
  Filled 2017-11-01: qty 0.5

## 2017-11-01 MED ORDER — MAGNESIUM CITRATE PO SOLN: 1.0000 | Freq: Once | ORAL | Status: DC | PRN

## 2017-11-01 MED ORDER — HYDROMORPHONE HCL 2 MG/ML IJ SOLN: 0.2500 mg | INTRAMUSCULAR | Status: DC | PRN

## 2017-11-01 MED ORDER — POLYETHYLENE GLYCOL 3350 17 G PO PACK: 17.0000 g | PACK | Freq: Every day | ORAL | Status: DC | PRN

## 2017-11-01 MED ORDER — LIDOCAINE HCL (CARDIAC) PF 100 MG/5ML IV SOSY
PREFILLED_SYRINGE | INTRAVENOUS | Status: DC | PRN
  Administered 2017-11-01: 60 mg via INTRAVENOUS

## 2017-11-01 MED ORDER — METHOCARBAMOL 500 MG PO TABS
500.0000 mg | ORAL_TABLET | Freq: Four times a day (QID) | ORAL | Status: DC | PRN
  Administered 2017-11-01 – 2017-11-03 (×3): 500 mg via ORAL
  Filled 2017-11-01 (×3): qty 1

## 2017-11-01 MED ORDER — MORPHINE SULFATE (PF) 2 MG/ML IV SOLN
0.5000 mg | INTRAVENOUS | Status: DC | PRN
  Filled 2017-11-01: qty 1

## 2017-11-01 MED ORDER — METOCLOPRAMIDE HCL 5 MG/ML IJ SOLN: 5.0000 mg | Freq: Three times a day (TID) | INTRAMUSCULAR | Status: DC | PRN

## 2017-11-01 MED ORDER — SODIUM CHLORIDE 0.9 % IV SOLN
INTRAVENOUS | Status: DC
  Administered 2017-11-01 – 2017-11-03 (×3): via INTRAVENOUS

## 2017-11-01 MED ORDER — CEFAZOLIN SODIUM-DEXTROSE 2-4 GM/100ML-% IV SOLN
2.0000 g | INTRAVENOUS | Status: AC
  Administered 2017-11-01: 2 g via INTRAVENOUS
  Filled 2017-11-01 (×2): qty 100

## 2017-11-01 MED ORDER — ONDANSETRON HCL 4 MG/2ML IJ SOLN: 4.0000 mg | Freq: Four times a day (QID) | INTRAMUSCULAR | Status: DC | PRN

## 2017-11-01 MED ORDER — LACTATED RINGERS IV SOLN
INTRAVENOUS | Status: DC
  Administered 2017-11-01: 11:00:00 via INTRAVENOUS

## 2017-11-01 MED ORDER — ONDANSETRON HCL 4 MG/2ML IJ SOLN
INTRAMUSCULAR | Status: DC | PRN
  Administered 2017-11-01: 4 mg via INTRAVENOUS

## 2017-11-01 MED ORDER — PHENYLEPHRINE 40 MCG/ML (10ML) SYRINGE FOR IV PUSH (FOR BLOOD PRESSURE SUPPORT)
PREFILLED_SYRINGE | INTRAVENOUS | Status: DC | PRN
  Administered 2017-11-01 (×3): 80 ug via INTRAVENOUS

## 2017-11-01 MED ORDER — OXYCODONE HCL 5 MG PO TABS: 5.0000 mg | ORAL_TABLET | ORAL | Status: DC | PRN

## 2017-11-01 MED ORDER — ACETAMINOPHEN 325 MG PO TABS: 325.0000 mg | ORAL_TABLET | Freq: Four times a day (QID) | ORAL | Status: DC | PRN

## 2017-11-01 MED ORDER — OXYCODONE-ACETAMINOPHEN 5-325 MG PO TABS: 1.0000 | ORAL_TABLET | ORAL | Status: DC | PRN

## 2017-11-01 MED ORDER — 0.9 % SODIUM CHLORIDE (POUR BTL) OPTIME
TOPICAL | Status: DC | PRN
  Administered 2017-11-01: 1000 mL

## 2017-11-01 MED ORDER — METOCLOPRAMIDE HCL 5 MG PO TABS
5.0000 mg | ORAL_TABLET | Freq: Three times a day (TID) | ORAL | Status: DC | PRN
  Filled 2017-11-01: qty 2

## 2017-11-01 MED ORDER — BISACODYL 10 MG RE SUPP: 10.0000 mg | Freq: Every day | RECTAL | Status: DC | PRN

## 2017-11-01 MED ORDER — FENTANYL CITRATE (PF) 250 MCG/5ML IJ SOLN
INTRAMUSCULAR | Status: AC
Start: 2017-11-01 — End: ?
  Filled 2017-11-01: qty 5

## 2017-11-01 MED ORDER — ONDANSETRON HCL 4 MG PO TABS: 4.0000 mg | ORAL_TABLET | Freq: Four times a day (QID) | ORAL | Status: DC | PRN

## 2017-11-01 MED ORDER — DEXTROSE 5 % IV SOLN
500.0000 mg | Freq: Four times a day (QID) | INTRAVENOUS | Status: DC | PRN
  Filled 2017-11-01: qty 5

## 2017-11-01 MED ORDER — MIDAZOLAM HCL 2 MG/2ML IJ SOLN
INTRAMUSCULAR | Status: AC
  Filled 2017-11-01: qty 2

## 2017-11-01 MED ORDER — OXYCODONE HCL 5 MG PO TABS
10.0000 mg | ORAL_TABLET | ORAL | Status: DC | PRN
  Administered 2017-11-01 – 2017-11-03 (×8): 15 mg via ORAL
  Administered 2017-11-03: 10 mg via ORAL
  Administered 2017-11-03: 15 mg via ORAL
  Filled 2017-11-01: qty 3
  Filled 2017-11-01: qty 2
  Filled 2017-11-01 (×8): qty 3

## 2017-11-01 MED ORDER — DOCUSATE SODIUM 100 MG PO CAPS
100.0000 mg | ORAL_CAPSULE | Freq: Two times a day (BID) | ORAL | Status: DC
  Administered 2017-11-01 – 2017-11-03 (×4): 100 mg via ORAL
  Filled 2017-11-01 (×4): qty 1

## 2017-11-01 SURGICAL SUPPLY — 32 items
BLADE SAW RECIP 87.9 MT (BLADE) ×2 IMPLANT
BNDG COHESIVE 6X5 TAN STRL LF (GAUZE/BANDAGES/DRESSINGS) ×2 IMPLANT
CANISTER WOUND CARE 500ML ATS (WOUND CARE) IMPLANT
CANISTER WOUNDNEG PRESSURE 500 (CANNISTER) ×1 IMPLANT
COVER SURGICAL LIGHT HANDLE (MISCELLANEOUS) ×2 IMPLANT
CUFF TOURNIQUET SINGLE 34IN LL (TOURNIQUET CUFF) IMPLANT
DRAPE INCISE IOBAN 66X45 STRL (DRAPES) ×4 IMPLANT
DRAPE U-SHAPE 47X51 STRL (DRAPES) ×2 IMPLANT
DRESSING PREVENA PLUS CUSTOM (GAUZE/BANDAGES/DRESSINGS) ×1 IMPLANT
DRSG PREVENA PLUS CUSTOM (GAUZE/BANDAGES/DRESSINGS) ×2
DURAPREP 26ML APPLICATOR (WOUND CARE) ×2 IMPLANT
ELECT REM PT RETURN 9FT ADLT (ELECTROSURGICAL) ×2
ELECTRODE REM PT RTRN 9FT ADLT (ELECTROSURGICAL) ×1 IMPLANT
GLOVE BIOGEL PI IND STRL 9 (GLOVE) ×1 IMPLANT
GLOVE BIOGEL PI INDICATOR 9 (GLOVE) ×1
GLOVE SURG ORTHO 9.0 STRL STRW (GLOVE) ×2 IMPLANT
GOWN STRL REUS W/ TWL XL LVL3 (GOWN DISPOSABLE) ×2 IMPLANT
GOWN STRL REUS W/TWL XL LVL3 (GOWN DISPOSABLE) ×4
KIT BASIN OR (CUSTOM PROCEDURE TRAY) ×2 IMPLANT
KIT TURNOVER KIT B (KITS) ×2 IMPLANT
MANIFOLD NEPTUNE II (INSTRUMENTS) ×2 IMPLANT
NS IRRIG 1000ML POUR BTL (IV SOLUTION) ×2 IMPLANT
PACK ORTHO EXTREMITY (CUSTOM PROCEDURE TRAY) ×2 IMPLANT
PAD ARMBOARD 7.5X6 YLW CONV (MISCELLANEOUS) ×2 IMPLANT
STAPLER VISISTAT 35W (STAPLE) IMPLANT
STOCKINETTE IMPERVIOUS LG (DRAPES) IMPLANT
SUT ETHILON 2 0 PSLX (SUTURE) ×4 IMPLANT
SUT SILK 2 0 (SUTURE) ×2
SUT SILK 2-0 18XBRD TIE 12 (SUTURE) ×1 IMPLANT
TOWEL GREEN STERILE FF (TOWEL DISPOSABLE) ×2 IMPLANT
TUBE CONNECTING 20X1/4 (TUBING) ×2 IMPLANT
YANKAUER SUCT BULB TIP NO VENT (SUCTIONS) ×2 IMPLANT

## 2017-11-01 NOTE — Op Note (Signed)
11/01/2017  1:29 PM  PATIENT:  Todd [name]    PRE-OPERATIVE DIAGNOSIS:  Osteomyelitis Left Tibia with Gangrene  POST-OPERATIVE DIAGNOSIS:  Same  PROCEDURE:  LEFT ABOVE KNEE AMPUTATION application of Praveena wound VAC  SURGEON:  Newt Minion, MD  PHYSICIAN ASSISTANT:None ANESTHESIA:   General  PREOPERATIVE INDICATIONS:  [name] is a  62 y.o. male with a diagnosis of Osteomyelitis Left Tibia with Gangrene who failed conservative measures and elected for surgical management.    The risks benefits and alternatives were discussed with the patient preoperatively including but not limited to the risks of infection, bleeding, nerve injury, cardiopulmonary complications, the need for revision surgery, among others, and the patient was willing to proceed.  OPERATIVE IMPLANTS: Praveena wound VAC  @ENCIMAGES @  OPERATIVE FINDINGS: Minimal petechial bleeding muscle had good color contractility.  OPERATIVE PROCEDURE: Patient was brought the operating room and underwent a general anesthetic.  After adequate levels of anesthesia were obtained patient's left lower extremity was prepped using DuraPrep draped into a sterile field a timeout was called.  A fishmouth incision was made just proximal to the patella.  This was carried sharply down to bone.  The vascular bundle was identified medially and suture-ligated with 2-0 silk.  The remainder of the amputation was completed the bone was resected 1 cm proximal to the skin incision.  Patient had bleeding from the sciatic nerve and this was suture ligated with 2-0 silk.  The deep superficial fascial layers and skin was closed using 2-0 nylon.  A Praveena wound VAC was applied this had a good suction fit patient was extubated taken the PACU in stable condition.   DISCHARGE PLANNING:  Antibiotic duration: 24 hours postoperatively  Weightbearing: Transfers only no gait training    Pain medication: continue  Dressing care/ Wound VAC: Continue  wound VAC for 1 week gait training only.   Ambulatory devices:transfers only  Discharge to: SNF  Follow-up: In the office 1 week post operative.

## 2017-11-01 NOTE — Anesthesia Preprocedure Evaluation (Addendum)
Anesthesia Evaluation  Patient identified by MRN, date of birth, ID band Patient awake    Reviewed: Allergy & Precautions, H&P , NPO status , Patient's Chart, lab work & pertinent test results, reviewed documented beta blocker date and time   Airway Mallampati: II  TM Distance: >3 FB Neck ROM: Full    Dental no notable dental hx. (+) Upper Dentures, Dental Advisory Given   Pulmonary Current Smoker,    Pulmonary exam normal breath sounds clear to auscultation       Cardiovascular hypertension, Pt. on medications and Pt. on home beta blockers + Peripheral Vascular Disease   Rhythm:Regular Rate:Normal     Neuro/Psych negative neurological ROS  negative psych ROS   GI/Hepatic negative GI ROS, Neg liver ROS,   Endo/Other  diabetes, Insulin DependentHypothyroidism   Renal/GU Renal InsufficiencyRenal disease  negative genitourinary   Musculoskeletal   Abdominal   Peds  Hematology negative hematology ROS (+)   Anesthesia Other Findings   Reproductive/Obstetrics negative OB ROS                            Anesthesia Physical Anesthesia Plan  ASA: III  Anesthesia Plan: General   Post-op Pain Management:    Induction: Intravenous  PONV Risk Score and Plan: 2 and Ondansetron and Midazolam  Airway Management Planned: LMA  Additional Equipment:   Intra-op Plan:   Post-operative Plan: Extubation in OR  Informed Consent: I have reviewed the patients History and Physical, chart, labs and discussed the procedure including the risks, benefits and alternatives for the proposed anesthesia with the patient or authorized representative who has indicated his/her understanding and acceptance.   Dental advisory given  Plan Discussed with: CRNA  Anesthesia Plan Comments:         Anesthesia Quick Evaluation

## 2017-11-01 NOTE — Progress Notes (Signed)
Patient mothers wants a call from Dr. Sharol Given

## 2017-11-01 NOTE — Progress Notes (Signed)
PROGRESS NOTE    Todd [name]  UXY:[id number] DOB: 1955-08-19 DOA: 10/25/2017 PCP: [name], MD   Brief Narrative: Todd [name] is a 62 year old male with history of diabetes mellitus type 2, hypertension, CK-MB 3, severe peripheral vascular disease status post right AKA and left BKA last year presented to the ED  with worsening ulcer close to his left BKA site with increasing pain and discharge and AKI on CKD3. -Improving on Abx, MRI completed positive for tibial stump osteomyelitis, Ortho Dr.Duda consulting, plan for AKA 11-01-2017   Assessment & Plan:   Principal Problem:   Cellulitis Active Problems:   AKI (acute kidney injury) ([place])   HTN (hypertension)   Hypothyroidism   Diabetes type 2, uncontrolled ([place])   ARF (acute renal failure) ([place])   Diabetic polyneuropathy associated with type 2 diabetes mellitus ([place])   Skin ulcer ([place])   Atherosclerosis of native arteries of extremities with gangrene, left leg ([place])   Dehiscence of amputation stump ([place])      BKA stump wound with secondary infection and osteomyelitis On IV Vancomycin  and ceftriaxone.  MRI concerning for Osteomyelitis, ESR - 97 Dr Sharol Given consulted, plan for AKA 5-22.   AKI; metabolic acidosis.  Cr baseline 1.6, 3 on admission.  Holding ACE.  Improved with IV fluids.  Labs pending today   DM;  SSI.  Resume lantus when taking oral.   Severe PVD;  Status post right AKA and left BKA   History of CAD -Stable, continue aspirin, Coreg and statin    DVT prophylaxis: heparin  Code Status: full code.  Family Communication: care discussed with patient.  Disposition Plan; likely needs SNF   Consultants:   Dr Sharol Given.    Procedures:  AKA 5-22    Antimicrobials:  Vancomycin and ceftriaxone.    Subjective: Having pain left LE. Awaiting sx. Denies chest  pain or dyspnea.   Objective: Vitals:   11/01/17 1335 11/01/17 1345 11/01/17 1400 11/01/17 1431  BP: 132/77 118/74 125/73 122/73    Pulse: 82 79 77 77  Resp: (!) 22 19 (!) 21 18  Temp: 97.7 F (36.5 C)  (!) 97.3 F (36.3 C) 99.7 F (37.6 C)  TempSrc:    Oral  SpO2: 100% 100% 100% 100%  Weight:      Height:        Intake/Output Summary (Last 24 hours) at 11/01/2017 1441 Last data filed at 11/01/2017 1318 Gross per 24 hour  Intake 750 ml  Output 620 ml  Net 130 ml   Filed Weights   10/31/17 0636 11/01/17 0500 11/01/17 1119  Weight: 63.7 kg (140 lb 6.9 oz) 63.9 kg (140 lb 14 oz) 63.9 kg (140 lb 14 oz)    Examination:  General exam: Appears calm and comfortable  Respiratory system: Clear to auscultation. Respiratory effort normal. Cardiovascular system: S1 & S2 heard, RRR. No JVD, murmurs, rubs, gallops or clicks. No pedal edema. Gastrointestinal system: Abdomen is nondistended, soft and nontender. No organomegaly or masses felt. Normal bowel sounds heard. Central nervous system: Alert and oriented. No focal neurological deficits. Extremities: right AKA. Left BKA with dressing.      Data Reviewed: I have personally reviewed following labs and imaging studies  CBC: Recent Labs  Lab 10/26/17 0800 10/27/17 0806 10/28/17 0331 10/29/17 0415  WBC 15.8* 17.8* 16.8* 18.5*  HGB 11.9* 11.3* 11.0* 10.7*  HCT 37.1* 35.0* 33.6* 32.9*  MCV 84.5 84.1 83.8 83.5  PLT 822* 833* 737* 166*   Basic Metabolic Panel: Recent  Labs  Lab 10/26/17 0800 10/27/17 0806 10/28/17 0331 10/28/17 1534 10/29/17 0415  NA 138 139 140  --  139  K 5.4* 4.7 4.1  --  4.0  CL 112* 113* 118*  --  114*  CO2 14* 17* 13*  --  16*  GLUCOSE 97 87 125*  --  82  BUN 94* 53* 41*  --  23*  CREATININE 3.05* 2.00* 1.73*  --  1.43*  CALCIUM 9.1 8.8* 7.9*  --  8.1*  MG  --   --   --  1.7  --    GFR: Estimated Creatinine Clearance: 48.4 mL/min (A) (by C-G formula based on SCr of 1.43 mg/dL (H)). Liver Function Tests: No results for input(s): AST, ALT, ALKPHOS, BILITOT, PROT, ALBUMIN in the last 168 hours. No results for input(s):  LIPASE, AMYLASE in the last 168 hours. No results for input(s): AMMONIA in the last 168 hours. Coagulation Profile: No results for input(s): INR, PROTIME in the last 168 hours. Cardiac Enzymes: No results for input(s): CKTOTAL, CKMB, CKMBINDEX, TROPONINI in the last 168 hours. BNP (last 3 results) No results for input(s): PROBNP in the last 8760 hours. HbA1C: No results for input(s): HGBA1C in the last 72 hours. CBG: Recent Labs  Lab 10/31/17 1646 10/31/17 2141 11/01/17 0750 11/01/17 1054 11/01/17 1335  GLUCAP 85 118* 87 85 95   Lipid Profile: No results for input(s): CHOL, HDL, LDLCALC, TRIG, CHOLHDL, LDLDIRECT in the last 72 hours. Thyroid Function Tests: No results for input(s): TSH, T4TOTAL, FREET4, T3FREE, THYROIDAB in the last 72 hours. Anemia Panel: No results for input(s): VITAMINB12, FOLATE, FERRITIN, TIBC, IRON, RETICCTPCT in the last 72 hours. Sepsis Labs: No results for input(s): PROCALCITON, LATICACIDVEN in the last 168 hours.  Recent Results (from the past 240 hour(s))  MRSA PCR Screening     Status: None   Collection Time: 10/26/17  1:53 AM  Result Value Ref Range Status   MRSA by PCR NEGATIVE NEGATIVE Final    Comment:        The GeneXpert MRSA Assay (FDA approved for NASAL specimens only), is one component of a comprehensive MRSA colonization surveillance program. It is not intended to diagnose MRSA infection nor to guide or monitor treatment for MRSA infections. Performed at [facility], [place] [address], [place], [place] [address]   Surgical pcr screen     Status: None   Collection Time: 11/01/17  1:24 AM  Result Value Ref Range Status   MRSA, PCR NEGATIVE NEGATIVE Final   Staphylococcus aureus NEGATIVE NEGATIVE Final    Comment: (NOTE) The Xpert SA Assay (FDA approved for NASAL specimens in patients 28 years of age and older), is one component of a comprehensive surveillance program. It is not intended to diagnose infection nor to guide  or monitor treatment. Performed at [facility], [place] [address], [place], [place] [address]          Radiology Studies: No results found.      Scheduled Meds: . allopurinol  100 mg Oral Daily  . aspirin EC  81 mg Oral Daily  . atorvastatin  40 mg Oral q1800  . carvedilol  6.25 mg Oral BID WC  . docusate sodium  100 mg Oral BID  . feeding supplement (GLUCERNA SHAKE)  237 mL Oral BID BM  . heparin  5,000 Units Subcutaneous Q8H  . insulin aspart  0-5 Units Subcutaneous QHS  . insulin aspart  0-9 Units Subcutaneous TID WC  .  levothyroxine  75 mcg Oral QAC breakfast  . multivitamin with minerals  1 tablet Oral Daily  . protein supplement shake  11 oz Oral Q breakfast   Continuous Infusions: . sodium chloride    . cefTRIAXone (ROCEPHIN)  IV Stopped (10/31/17 1513)  . lactated ringers 10 mL/hr at 11/01/17 1121  . methocarbamol (ROBAXIN)  IV    . vancomycin Stopped (10/31/17 1849)     LOS: 7 days    Time spent:35 minutes.     Elmarie Shiley, MD [facility] Pager [phone]  If 7PM-7AM, please contact night-coverage [email or web address] Password [email or web address] 11/01/2017, 2:41 PM

## 2017-11-01 NOTE — Progress Notes (Signed)
PT Cancellation Note  Patient Details Name: Todd [name] MRN: [id number] DOB: 05-13-1956   Cancelled Treatment:    Reason Eval/Treat Not Completed: Other (comment)   Noted plan for AKA today;   Will follow up for PT eval postop;   [name], [place]  Acute Rehabilitation Services Pager [phone] Office [phone]    [name] 11/01/2017, 8:44 AM

## 2017-11-01 NOTE — Transfer of Care (Signed)
Immediate Anesthesia Transfer of Care Note  Patient: Todd [name]  Procedure(s) Performed: LEFT ABOVE KNEE AMPUTATION (Left Leg Lower)  Patient Location: PACU  Anesthesia Type:General  Level of Consciousness: drowsy and patient cooperative  Airway & Oxygen Therapy: Patient Spontanous Breathing  Post-op Assessment: Report given to RN and Post -op Vital signs reviewed and stable  Post vital signs: Reviewed and stable  Last Vitals:  Vitals Value Taken Time  BP    Temp    Pulse 81 11/01/2017  1:36 PM  Resp 22 11/01/2017  1:36 PM  SpO2 99 % 11/01/2017  1:36 PM  Vitals shown include unvalidated device data.  Last Pain:  Vitals:   11/01/17 0912  TempSrc:   PainSc: Asleep      Patients Stated Pain Goal: 4 (25/52/58 9483)  Complications: No apparent anesthesia complications

## 2017-11-01 NOTE — Anesthesia Procedure Notes (Signed)
Procedure Name: LMA Insertion Date/Time: 11/01/2017 1:01 PM Performed by: Julieta Bellini, CRNA Pre-anesthesia Checklist: Patient identified, Emergency Drugs available, Suction available, Patient being monitored and Timeout performed Patient Re-evaluated:Patient Re-evaluated prior to induction Oxygen Delivery Method: Circle system utilized Preoxygenation: Pre-oxygenation with 100% oxygen Induction Type: IV induction Ventilation: Mask ventilation without difficulty LMA: LMA inserted LMA Size: 4.0 Number of attempts: 1 Dental Injury: Teeth and Oropharynx as per pre-operative assessment

## 2017-11-02 ENCOUNTER — Encounter (HOSPITAL_COMMUNITY): Payer: Self-pay | Admitting: Orthopedic Surgery

## 2017-11-02 LAB — CBC
HEMATOCRIT: 29.8 % — AB (ref 39.0–52.0)
Hemoglobin: 9.5 g/dL — ABNORMAL LOW (ref 13.0–17.0)
MCH: 26.8 pg (ref 26.0–34.0)
MCHC: 31.9 g/dL (ref 30.0–36.0)
MCV: 84.2 fL (ref 78.0–100.0)
PLATELETS: 720 10*3/uL — AB (ref 150–400)
RBC: 3.54 MIL/uL — ABNORMAL LOW (ref 4.22–5.81)
RDW: 15.3 % (ref 11.5–15.5)
WBC: 16 10*3/uL — AB (ref 4.0–10.5)

## 2017-11-02 LAB — BASIC METABOLIC PANEL
Anion gap: 10 (ref 5–15)
BUN: 14 mg/dL (ref 6–20)
CALCIUM: 7.6 mg/dL — AB (ref 8.9–10.3)
CO2: 18 mmol/L — ABNORMAL LOW (ref 22–32)
Chloride: 113 mmol/L — ABNORMAL HIGH (ref 101–111)
Creatinine, Ser: 1.27 mg/dL — ABNORMAL HIGH (ref 0.61–1.24)
GFR calc Af Amer: >60 mL/min (ref >60)
GFR, EST NON AFRICAN AMERICAN: 59 mL/min — AB (ref >60)
GLUCOSE: 116 mg/dL — AB (ref 65–99)
Potassium: 3.3 mmol/L — ABNORMAL LOW (ref 3.5–5.1)
SODIUM: 141 mmol/L (ref 135–145)

## 2017-11-02 LAB — GLUCOSE, CAPILLARY
GLUCOSE-CAPILLARY: 91 mg/dL (ref 65–99)
GLUCOSE-CAPILLARY: 119 mg/dL — AB (ref 65–99)
Glucose-Capillary: 98 mg/dL (ref 65–99)
Glucose-Capillary: 115 mg/dL — ABNORMAL HIGH (ref 65–99)

## 2017-11-02 MED ORDER — POTASSIUM CHLORIDE CRYS ER 20 MEQ PO TBCR
40.0000 meq | EXTENDED_RELEASE_TABLET | Freq: Once | ORAL | Status: AC
  Administered 2017-11-02: 40 meq via ORAL
  Filled 2017-11-02: qty 2

## 2017-11-02 MED ORDER — POTASSIUM CHLORIDE CRYS ER 20 MEQ PO TBCR
20.0000 meq | EXTENDED_RELEASE_TABLET | Freq: Once | ORAL | Status: AC
  Administered 2017-11-02: 20 meq via ORAL
  Filled 2017-11-02: qty 1

## 2017-11-02 NOTE — Progress Notes (Signed)
PROGRESS NOTE    Todd [name]  QIW:[id number] DOB: 01-29-1956 DOA: 10/25/2017 PCP: [name], MD   Brief Narrative: Mr. Boquet is a 62 year old male with history of diabetes mellitus type 2, hypertension, CK-MB 3, severe peripheral vascular disease status post right AKA and left BKA last year presented to the ED  with worsening ulcer close to his left BKA site with increasing pain and discharge and AKI on CKD3. -Improving on Abx, MRI completed positive for tibial stump osteomyelitis, Ortho Dr.Duda consulting, plan for AKA 11-01-2017   Assessment & Plan:   Principal Problem:   Cellulitis Active Problems:   AKI (acute kidney injury) ([place])   HTN (hypertension)   Hypothyroidism   Diabetes type 2, uncontrolled ([place])   ARF (acute renal failure) ([place])   Diabetic polyneuropathy associated with type 2 diabetes mellitus ([place])   Skin ulcer ([place])   Atherosclerosis of native arteries of extremities with gangrene, left leg ([place])   Dehiscence of amputation stump ([place])      BKA stump wound with secondary infection and osteomyelitis Treated with  IV Vancomycin  and ceftriaxone. Plan to keep IV antibiotics for 24 hours after surgery  MRI concerning for Osteomyelitis, ESR - 97 Dr Sharol Given consulted, S/P  AKA 5-22.  Plan to discharge patient with provena wound vac.   AKI; metabolic acidosis.  Cr baseline 1.6, 3 on admission.  Holding ACE.  Continue to improved with fluids. Metabolic acidosis improved.  Replete k.    DM;  SSI.  Resume lantus when taking oral.   Severe PVD;  Status post right AKA and left BKA   History of CAD -Stable, continue aspirin, Coreg and statin  Anemia; acute blood loss post surgery. Follow hb trend.    DVT prophylaxis: heparin  Code Status: full code.  Family Communication: care discussed with patient.  Disposition Plan; likely needs SNF   Consultants:   Dr Sharol Given.    Procedures:  AKA 5-22    Antimicrobials:  Vancomycin and  ceftriaxone.    Subjective: He report pain left leg,.  Denies dyspnea.   Objective: Vitals:   11/01/17 2100 11/02/17 0500 11/02/17 0813 11/02/17 1212  BP: 113/75 106/75 138/78 108/66  Pulse: 81 78 77   Resp: 17 17 16    Temp: 98.1 F (36.7 C) 98.2 F (36.8 C) 98.2 F (36.8 C) 98.4 F (36.9 C)  TempSrc: Oral Oral Oral Oral  SpO2: 100% 98% 100% 100%  Weight:  65.9 kg (145 lb 4.5 oz)    Height:        Intake/Output Summary (Last 24 hours) at 11/02/2017 1304 Last data filed at 11/02/2017 1030 Gross per 24 hour  Intake 1478.25 ml  Output 925 ml  Net 553.25 ml   Filed Weights   11/01/17 0500 11/01/17 1119 11/02/17 0500  Weight: 63.9 kg (140 lb 14 oz) 63.9 kg (140 lb 14 oz) 65.9 kg (145 lb 4.5 oz)    Examination:  General exam: NAD Respiratory system: CTA Cardiovascular system: S 1, S 2 RRR Gastrointestinal system: BS present, soft, nt Central nervous system: alert, follows command.  Extremities: right AKA. Left Le S/P AKA wound vac in place.      Data Reviewed: I have personally reviewed following labs and imaging studies  CBC: Recent Labs  Lab 10/27/17 0806 10/28/17 0331 10/29/17 0415 11/01/17 1511 11/02/17 0504  WBC 17.8* 16.8* 18.5* 16.8* 16.0*  HGB 11.3* 11.0* 10.7* 11.2* 9.5*  HCT 35.0* 33.6* 32.9* 35.1* 29.8*  MCV 84.1 83.8 83.5 84.8 84.2  PLT 833* 737* 784* 709* 102*   Basic Metabolic Panel: Recent Labs  Lab 10/27/17 0806 10/28/17 0331 10/28/17 1534 10/29/17 0415 11/01/17 1511 11/02/17 0504  NA 139 140  --  139 141 141  K 4.7 4.1  --  4.0 4.0 3.3*  CL 113* 118*  --  114* 110 113*  CO2 17* 13*  --  16* 19* 18*  GLUCOSE 87 125*  --  82 92 116*  BUN 53* 41*  --  23* 13 14  CREATININE 2.00* 1.73*  --  1.43* 1.36* 1.27*  CALCIUM 8.8* 7.9*  --  8.1* 8.4* 7.6*  MG  --   --  1.7  --   --   --    GFR: Estimated Creatinine Clearance: 56.2 mL/min (A) (by C-G formula based on SCr of 1.27 mg/dL (H)). Liver Function Tests: No results for input(s):  AST, ALT, ALKPHOS, BILITOT, PROT, ALBUMIN in the last 168 hours. No results for input(s): LIPASE, AMYLASE in the last 168 hours. No results for input(s): AMMONIA in the last 168 hours. Coagulation Profile: No results for input(s): INR, PROTIME in the last 168 hours. Cardiac Enzymes: No results for input(s): CKTOTAL, CKMB, CKMBINDEX, TROPONINI in the last 168 hours. BNP (last 3 results) No results for input(s): PROBNP in the last 8760 hours. HbA1C: No results for input(s): HGBA1C in the last 72 hours. CBG: Recent Labs  Lab 11/01/17 1335 11/01/17 1553 11/01/17 2204 11/02/17 0811 11/02/17 1210  GLUCAP 95 123* 95 91 98   Lipid Profile: No results for input(s): CHOL, HDL, LDLCALC, TRIG, CHOLHDL, LDLDIRECT in the last 72 hours. Thyroid Function Tests: No results for input(s): TSH, T4TOTAL, FREET4, T3FREE, THYROIDAB in the last 72 hours. Anemia Panel: No results for input(s): VITAMINB12, FOLATE, FERRITIN, TIBC, IRON, RETICCTPCT in the last 72 hours. Sepsis Labs: No results for input(s): PROCALCITON, LATICACIDVEN in the last 168 hours.  Recent Results (from the past 240 hour(s))  MRSA PCR Screening     Status: None   Collection Time: 10/26/17  1:53 AM  Result Value Ref Range Status   MRSA by PCR NEGATIVE NEGATIVE Final    Comment:        The GeneXpert MRSA Assay (FDA approved for NASAL specimens only), is one component of a comprehensive MRSA colonization surveillance program. It is not intended to diagnose MRSA infection nor to guide or monitor treatment for MRSA infections. Performed at [facility], [place] [address] [place], [place] [address]   Surgical pcr screen     Status: None   Collection Time: 11/01/17  1:24 AM  Result Value Ref Range Status   MRSA, PCR NEGATIVE NEGATIVE Final   Staphylococcus aureus NEGATIVE NEGATIVE Final    Comment: (NOTE) The Xpert SA Assay (FDA approved for NASAL specimens in patients 18 years of age and older), is one component of a  comprehensive surveillance program. It is not intended to diagnose infection nor to guide or monitor treatment. Performed at [facility], [place] [address] [place], [place] [address]          Radiology Studies: No results found.      Scheduled Meds: . allopurinol  100 mg Oral Daily  . aspirin EC  81 mg Oral Daily  . atorvastatin  40 mg Oral q1800  . carvedilol  6.25 mg Oral BID WC  . docusate sodium  100 mg Oral BID  . feeding supplement (GLUCERNA SHAKE)  237 mL Oral BID BM  . heparin  5,000 Units Subcutaneous Q8H  . insulin aspart  0-5 Units Subcutaneous QHS  . insulin aspart  0-9 Units Subcutaneous TID WC  . levothyroxine  75 mcg Oral QAC breakfast  . multivitamin with minerals  1 tablet Oral Daily  . protein supplement shake  11 oz Oral Q breakfast   Continuous Infusions: . sodium chloride    . sodium chloride 75 mL/hr at 11/02/17 0602  . cefTRIAXone (ROCEPHIN)  IV Stopped (10/31/17 1513)  . lactated ringers 10 mL/hr at 11/01/17 1121  . methocarbamol (ROBAXIN)  IV    . vancomycin Stopped (11/01/17 1748)     LOS: 8 days    Time spent:35 minutes.     Elmarie Shiley, MD [facility] Pager [phone]  If 7PM-7AM, please contact night-coverage [email or web address] Password [email or web address] 11/02/2017, 1:04 PM

## 2017-11-02 NOTE — Evaluation (Signed)
Physical Therapy Evaluation Patient Details Name: Todd [name] MRN: [id number] DOB: June 12, 1956 Today's Date: 11/02/2017   History of Present Illness  Todd [name] is a 62 year old male with history of diabetes mellitus type 2, hypertension, CK-MB 3, severe peripheral vascular disease status post right AKA and left BKA last year presented to the ED  with worsening ulcer close to his left BKA site with increasing pain, s/p L AKA on 5/22 with [facility] placement  Clinical Impression  Pt admitted with above diagnosis. Pt currently with functional limitations due to the deficits listed below (see PT Problem List). PT eval limited d/t pt not fully cooperative d/t pain, RN contacted and not time for meds; Pt agreeable to long sitting and is able to pull up on trapeze for bed pad change;  Pt uses trapeze bar to swing into w/c at home, he may be able to transition back home with HHPT as he functioned from a w/c level prior to admission; he does have wound VAC now and may have a skilled nursing need if cannot be mangaed at home;  will continue to follow and update plan of care as needed, no family present to confirm pt status--he lives with his 38yr old mother; pt reports she drives, helps with household tasks.  Pt will benefit from skilled PT to increase their independence and safety with mobility to allow discharge to the venue listed below.       Follow Up Recommendations Home health PT(vs SNF depending on progress)    Equipment Recommendations  None recommended by PT    Recommendations for Other Services       Precautions / Restrictions        Mobility  Bed Mobility Overal bed mobility: Needs Assistance             General bed mobility comments: pt refuses EOB/OOB; he long sits in bed with use of rails; pt lifts self off bed with trapeze for pad change  Transfers                    Ambulation/Gait                Stairs            Wheelchair Mobility     Modified Rankin (Stroke Patients Only)       Balance Overall balance assessment: Needs assistance Sitting-balance support: No upper extremity supported Sitting balance-Leahy Scale: Fair Sitting balance - Comments: long sits in bed without assist, limited wt shifting d/t pain                                       Pertinent Vitals/Pain Pain Assessment: Faces Faces Pain Scale: Hurts whole lot Pain Location: L residual limb Pain Descriptors / Indicators: Constant;Grimacing Pain Intervention(s): Monitored during session;Repositioned;Patient requesting pain meds-RN notified    Home Living Family/patient expects to be discharged to:: Unsure Living Arrangements: Other relatives(his 20yr old mother) Available Help at Discharge: Family;Available PRN/intermittently Type of Home: House Home Access: Ramped entrance       Home Equipment: Wheelchair - manual;Hospital bed;Other (comment) Additional Comments: has trapeze on hospital bed; single rail on hospital bed (not split rails), he keeps one up and one down    Prior Function     Gait / Transfers Assistance Needed: pt reports he is independent with bed to chair transfers, he uses the trapeze to swing over  into chair;   ADL's / Homemaking Assistance Needed: sponge bathes independently        Hand Dominance        Extremity/Trunk Assessment   Upper Extremity Assessment Upper Extremity Assessment: Overall WFL for tasks assessed    Lower Extremity Assessment Lower Extremity Assessment: RLE deficits/detail;LLE deficits/detail RLE Deficits / Details: R AKA, grossly WFL LLE: Unable to fully assess due to pain       Communication   Communication: No difficulties  Cognition Arousal/Alertness: Awake/alert Behavior During Therapy: WFL for tasks assessed/performed Overall Cognitive Status: Within Functional Limits for tasks assessed                                        General Comments       Exercises     Assessment/Plan    PT Assessment Patient needs continued PT services  PT Problem List Decreased activity tolerance;Decreased balance;Decreased mobility;Pain       PT Treatment Interventions Therapeutic activities;Functional mobility training;Therapeutic exercise;Patient/family education    PT Goals (Current goals can be found in the Care Plan section)  Acute Rehab PT Goals Patient Stated Goal: pt initially discusses rehab and then states he wants to go home PT Goal Formulation: With patient Time For Goal Achievement: 11/16/17 Potential to Achieve Goals: Good    Frequency Min 3X/week   Barriers to discharge        Co-evaluation               AM-PAC PT "6 Clicks" Daily Activity  Outcome Measure Difficulty turning over in bed (including adjusting bedclothes, sheets and blankets)?: A Lot Difficulty moving from lying on back to sitting on the side of the bed? : Unable Difficulty sitting down on and standing up from a chair with arms (e.g., wheelchair, bedside commode, etc,.)?: Unable Help needed moving to and from a bed to chair (including a wheelchair)?: A Lot Help needed walking in hospital room?: Total Help needed climbing 3-5 steps with a railing? : Total 6 Click Score: 8    End of Session   Activity Tolerance: Patient limited by pain Patient left: in bed;with call bell/phone within reach;with bed alarm set   PT Visit Diagnosis: Pain Pain - Right/Left: Left Pain - part of body: Leg    Time: 3875-6433 PT Time Calculation (min) (ACUTE ONLY): 18 min   Charges:   PT Evaluation $PT Eval Low Complexity: 1 Low     PT G CodesKenyon [name], PT Pager: [phone] 11/02/2017   [facility] 11/02/2017, 4:33 PM

## 2017-11-02 NOTE — Progress Notes (Signed)
Patient ID: Todd [name], male   DOB: June 08, 1956, 62 y.o.   MRN: [id number] Patient is postoperative day 1 left above-knee amputation.  There is no drainage in the wound VAC.  Patient has no complaints.  Patient will need discharge with the portable Praveena wound VAC will remain in place for 1 week.  Patient is safe from an orthopedic standpoint for discharge to skilled nursing at this time.

## 2017-11-02 NOTE — Clinical Social Work Note (Signed)
Clinical Social Work Assessment  Patient Details  Name: Todd [name] MRN: [id number] Date of Birth: 11/18/1955  Date of referral:  11/02/17               Reason for consult:  Facility Placement                Permission sought to share information with:  Facility Sport and exercise psychologist, Family Supports Permission granted to share information::  Yes, Verbal Permission Granted  Name::        Agency::  SNFs  Relationship::     Contact Information:     Housing/Transportation Living arrangements for the past 2 months:  Single Family Home Source of Information:  Patient Patient Interpreter Needed:  None Criminal Activity/Legal Involvement Pertinent to Current Situation/Hospitalization:  No - Comment as needed Significant Relationships:  Siblings, Parents Lives with:  Parents Do you feel safe going back to the place where you live?  No Need for family participation in patient care:  No (Coment)  Care giving concerns:  CSW received consult for possible SNF placement at time of discharge. CSW spoke with patient regarding PT recommendation of SNF placement at time of discharge. Patient reported that patient's family is currently unable to care for patient at their home given patient's current physical needs and fall risk. Patient expressed understanding of PT recommendation and is agreeable to SNF placement at time of discharge. CSW to continue to follow and assist with discharge planning needs.   Social Worker assessment / plan:  CSW spoke with patient concerning possibility of rehab at [facility] before returning home.  Employment status:  Disabled (Comment on whether or not currently receiving Disability) Insurance information:  Self Pay (Medicaid Pending) PT Recommendations:  [place] / Referral to community resources:  [place]  Patient/Family's Response to care:  Patient recognizes need for rehab before returning home and is agreeable to a SNF  in [place]. Patient reported understanding that since he has no insurance the hospital has agreed to provide a 30 day Letter of Guarantee to pay for patient to go to SNF. Patient is trying to get his disability activated again. Only SNF available is [place]. Patient declined for CSW to contact his family. He states they are currently out of town and he will let them know where he will be discharging to. Patient expresses appreciation for all the hospital has done for him.   Patient/Family's Understanding of and Emotional Response to Diagnosis, Current Treatment, and Prognosis:  Patient/family is realistic regarding therapy needs and expressed being hopeful for SNF placement. Patient expressed understanding of CSW role and discharge process as well as medical condition. No questions/concerns about plan or treatment.    Emotional Assessment Appearance:  Appears stated age Attitude/Demeanor/Rapport:  Gracious Affect (typically observed):  Accepting, Appropriate Orientation:  Oriented to Place, Oriented to Self, Oriented to  Time, Oriented to Situation Alcohol / Substance use:  Not Applicable Psych involvement (Current and /or in the community):  No (Comment)  Discharge Needs  Concerns to be addressed:  Care Coordination Readmission within the last 30 days:  No Current discharge risk:  None Barriers to Discharge:  Continued Medical Work up   [employer], [place] 11/02/2017, 4:11 PM

## 2017-11-02 NOTE — Anesthesia Postprocedure Evaluation (Signed)
Anesthesia Post Note  Patient: Thanh Mottern  Procedure(s) Performed: LEFT ABOVE KNEE AMPUTATION (Left Leg Lower)     Patient location during evaluation: PACU Anesthesia Type: General Level of consciousness: awake and alert Pain management: pain level controlled Vital Signs Assessment: post-procedure vital signs reviewed and stable Respiratory status: spontaneous breathing, nonlabored ventilation, respiratory function stable and patient connected to nasal cannula oxygen Cardiovascular status: blood pressure returned to baseline and stable Postop Assessment: no apparent nausea or vomiting Anesthetic complications: no    Last Vitals:  Vitals:   11/02/17 0500 11/02/17 0813  BP: 106/75 138/78  Pulse: 78 77  Resp: 17 16  Temp: 36.8 C 36.8 C  SpO2: 98% 100%    Last Pain:  Vitals:   11/02/17 0813  TempSrc: Oral  PainSc:                  Demitrious Mccannon,W. EDMOND

## 2017-11-02 NOTE — NC FL2 (Signed)
[place] LEVEL OF CARE SCREENING TOOL     IDENTIFICATION  Patient Name: Todd [name] Birthdate: 1956-04-02 Sex: male Admission Date (Current Location): 10/25/2017  [facility] and [place] Number:  Herbalist and Address:  [facility] [place], [place] [address] [place], [place] [address]      Provider Number: [id number]  Attending Physician Name and Address:  Elmarie Shiley, MD  Relative Name and Phone Number:  [name], sister, [phone]    Current Level of Care: Hospital Recommended Level of Care: [place] Prior Approval Number:    Date Approved/Denied:   PASRR Number: [id number]  Discharge Plan: SNF    Current Diagnoses: Patient Active Problem List   Diagnosis Date Noted  . Atherosclerosis of native arteries of extremities with gangrene, left leg ([place])   . Dehiscence of amputation stump ([place])   . Cellulitis 10/25/2017  . Wound infection   . Urinary incontinence 08/14/2017  . Skin ulcer ([place]) 07/28/2017  . History of right above knee amputation ([place]) 09/21/2016  . Acquired absence of left leg below knee ([place]) 09/21/2016  . Transaminitis   . Diabetic polyneuropathy associated with type 2 diabetes mellitus ([place]) 07/21/2016  . ARF (acute renal failure) ([place]) 07/04/2016  . Hyperuricemia 06/21/2016  . HTN (hypertension) 01/05/2016  . Diabetes type 2, uncontrolled ([place]) 01/05/2016  . AKI (acute kidney injury) ([place]) 12/20/2015  . Hypothyroidism 05/10/2011    Orientation RESPIRATION BLADDER Height & Weight     Self, Situation, Time, Place  Normal Continent Weight: 65.9 kg (145 lb 4.5 oz) Height:  5\' 8"  (172.7 cm)  BEHAVIORAL SYMPTOMS/MOOD NEUROLOGICAL BOWEL NUTRITION STATUS      Incontinent Diet(Please see DC Summary)  AMBULATORY STATUS COMMUNICATION OF NEEDS Skin   Extensive Assist Verbally PU Stage and Appropriate Care, Wound Vac(Stage II on coccyx; Closed incision on leg with pravena temporary wound  vac)                       Personal Care Assistance Level of Assistance  Bathing, Feeding, Dressing Bathing Assistance: Maximum assistance Feeding assistance: Independent Dressing Assistance: Limited assistance     Functional Limitations Info             SPECIAL CARE FACTORS FREQUENCY  PT (By licensed PT), OT (By licensed OT)     PT Frequency: 5x/week OT Frequency: 3x/week            Contractures      Additional Factors Info  Code Status, Allergies, Insulin Sliding Scale Code Status Info: Full Allergies Info: Lisinopril   Insulin Sliding Scale Info: 3x daily with meals and at bedtime       Current Medications (11/02/2017):  This is the current hospital active medication list Current Facility-Administered Medications  Medication Dose Route Frequency Provider Last Rate Last Dose  . 0.9 %  sodium chloride infusion   Intravenous Continuous Newt Minion, MD      . 0.9 %  sodium chloride infusion   Intravenous Continuous Regalado, Belkys A, MD 75 mL/hr at 11/02/17 1406    . acetaminophen (TYLENOL) tablet 650 mg  650 mg Oral Q6H PRN Newt Minion, MD   650 mg at 10/26/17 0827   Or  . acetaminophen (TYLENOL) suppository 650 mg  650 mg Rectal Q6H PRN Newt Minion, MD      . allopurinol (ZYLOPRIM) tablet 100 mg  100 mg Oral Daily Newt Minion, MD   100  mg at 11/02/17 0854  . aspirin EC tablet 81 mg  81 mg Oral Daily Newt Minion, MD   81 mg at 11/02/17 0854  . atorvastatin (LIPITOR) tablet 40 mg  40 mg Oral q1800 Newt Minion, MD   40 mg at 11/01/17 1825  . bisacodyl (DULCOLAX) suppository 10 mg  10 mg Rectal Daily PRN Newt Minion, MD      . carvedilol (COREG) tablet 6.25 mg  6.25 mg Oral BID WC Newt Minion, MD   6.25 mg at 11/02/17 0854  . cefTRIAXone (ROCEPHIN) 2 g in sodium chloride 0.9 % 100 mL IVPB  2 g Intravenous Q24H Domenic Polite, MD   Stopped at 11/02/17 1500  . docusate sodium (COLACE) capsule 100 mg  100 mg Oral BID Newt Minion, MD   100  mg at 11/02/17 0854  . feeding supplement (GLUCERNA SHAKE) (GLUCERNA SHAKE) liquid 237 mL  237 mL Oral BID BM Newt Minion, MD   237 mL at 11/02/17 1500  . heparin injection 5,000 Units  5,000 Units Subcutaneous Q8H Newt Minion, MD   5,000 Units at 11/02/17 1409  . hydrALAZINE (APRESOLINE) injection 10 mg  10 mg Intravenous Q6H PRN Newt Minion, MD      . HYDROmorphone (DILAUDID) injection 0.5-1 mg  0.5-1 mg Intravenous Q4H PRN Newt Minion, MD   0.5 mg at 11/01/17 1639  . insulin aspart (novoLOG) injection 0-5 Units  0-5 Units Subcutaneous QHS Newt Minion, MD      . insulin aspart (novoLOG) injection 0-9 Units  0-9 Units Subcutaneous TID WC Newt Minion, MD   1 Units at 10/30/17 1310  . levothyroxine (SYNTHROID, LEVOTHROID) tablet 75 mcg  75 mcg Oral QAC breakfast Newt Minion, MD   75 mcg at 11/02/17 0854  . magnesium citrate solution 1 Bottle  1 Bottle Oral Once PRN Newt Minion, MD      . methocarbamol (ROBAXIN) tablet 500 mg  500 mg Oral Q6H PRN Newt Minion, MD   500 mg at 11/01/17 1639   Or  . methocarbamol (ROBAXIN) 500 mg in dextrose 5 % 50 mL IVPB  500 mg Intravenous Q6H PRN Newt Minion, MD      . metoCLOPramide (REGLAN) tablet 5-10 mg  5-10 mg Oral Q8H PRN Newt Minion, MD       Or  . metoCLOPramide (REGLAN) injection 5-10 mg  5-10 mg Intravenous Q8H PRN Newt Minion, MD      . morphine 2 MG/ML injection 0.5 mg  0.5 mg Intravenous Q4H PRN Newt Minion, MD      . multivitamin with minerals tablet 1 tablet  1 tablet Oral Daily Newt Minion, MD   1 tablet at 11/02/17 [phone]  . ondansetron (ZOFRAN) tablet 4 mg  4 mg Oral Q6H PRN Newt Minion, MD       Or  . ondansetron [facility]) injection 4 mg  4 mg Intravenous Q6H PRN Newt Minion, MD      . oxyCODONE (Oxy IR/ROXICODONE) immediate release tablet 10-15 mg  10-15 mg Oral Q4H PRN Newt Minion, MD   15 mg at 11/02/17 1410  . oxyCODONE (Oxy IR/ROXICODONE) immediate release tablet 5-10 mg  5-10 mg Oral Q4H PRN  Newt Minion, MD      . polyethylene glycol (MIRALAX / GLYCOLAX) packet 17 g  17 g Oral Daily PRN Newt Minion, MD      .  potassium chloride SA (K-DUR,KLOR-CON) CR tablet 20 mEq  20 mEq Oral Once Regalado, Belkys A, MD      . protein supplement (PREMIER PROTEIN) liquid  11 oz Oral Q breakfast Newt Minion, MD   11 oz at 11/02/17 0855  . sodium chloride flush (NS) 0.9 % injection 10-40 mL  10-40 mL Intracatheter PRN Newt Minion, MD   10 mL at 10/27/17 0846  . vancomycin (VANCOCIN) 1,250 mg in sodium chloride 0.9 % 250 mL IVPB  1,250 mg Intravenous Q24H Domenic Polite, MD   Stopped at 11/01/17 1748     Discharge Medications: Please see discharge summary for a list of discharge medications.  Relevant Imaging Results:  Relevant Lab Results:   Additional Information SSN: [id number]  Benard Halsted, LCSWA

## 2017-11-02 NOTE — Progress Notes (Signed)
Nutrition Education Note  RD was stopped by patient's nurse who requested RD speak with patient about carb modified diet and provide diet education.   Lab Results  Component Value Date   HGBA1C 6.8 (H) 08/08/2016    RD provided "Carbohydrate Counting for People with Diabetes" handout from the Academy of Nutrition and Dietetics. Discussed different food groups and their effects on blood sugar, emphasizing carbohydrate-containing foods. Provided list of carbohydrates and recommended serving sizes of common foods.  Discussed importance of controlled and consistent carbohydrate intake throughout the day. Provided examples of ways to balance meals/snacks and encouraged intake of high-fiber, whole grain complex carbohydrates. Provided diabetic snacks handout.Teach back method used.  Expect fair compliance.  Body mass index is 22.09 kg/m. Pt meets criteria for normal weight based on current BMI.  Current diet order is regular, patient is consuming approximately 75% of meals at this time. Labs and medications reviewed. No further nutrition interventions warranted at this time. RD contact information provided. If additional nutrition issues arise, please re-consult RD.  Todd [name]. Todd Salsberry, MS, RD LDN Inpatient Clinical Dietitian Pager [phone]

## 2017-11-03 ENCOUNTER — Other Ambulatory Visit: Payer: Self-pay

## 2017-11-03 LAB — GLUCOSE, CAPILLARY
Glucose-Capillary: 93 mg/dL (ref 65–99)
Glucose-Capillary: 115 mg/dL — ABNORMAL HIGH (ref 65–99)
Glucose-Capillary: 131 mg/dL — ABNORMAL HIGH (ref 65–99)

## 2017-11-03 LAB — BASIC METABOLIC PANEL
Anion gap: 10 (ref 5–15)
BUN: 10 mg/dL (ref 6–20)
CALCIUM: 7.6 mg/dL — AB (ref 8.9–10.3)
CO2: 18 mmol/L — AB (ref 22–32)
CREATININE: 1.25 mg/dL — AB (ref 0.61–1.24)
Chloride: 115 mmol/L — ABNORMAL HIGH (ref 101–111)
GFR calc Af Amer: >60 mL/min (ref >60)
GLUCOSE: 99 mg/dL (ref 65–99)
Potassium: 3.8 mmol/L (ref 3.5–5.1)
Sodium: 143 mmol/L (ref 135–145)

## 2017-11-03 LAB — CBC
HEMATOCRIT: 29.2 % — AB (ref 39.0–52.0)
Hemoglobin: 9.3 g/dL — ABNORMAL LOW (ref 13.0–17.0)
MCH: 27.1 pg (ref 26.0–34.0)
MCHC: 31.8 g/dL (ref 30.0–36.0)
MCV: 85.1 fL (ref 78.0–100.0)
Platelets: 673 10*3/uL — ABNORMAL HIGH (ref 150–400)
RBC: 3.43 MIL/uL — ABNORMAL LOW (ref 4.22–5.81)
RDW: 15.4 % (ref 11.5–15.5)
WBC: 15.7 10*3/uL — ABNORMAL HIGH (ref 4.0–10.5)

## 2017-11-03 MED ORDER — ALLOPURINOL 100 MG PO TABS: 100.0000 mg | ORAL_TABLET | Freq: Every day | ORAL | 6 refills | Status: DC

## 2017-11-03 MED ORDER — POLYETHYLENE GLYCOL 3350 17 G PO PACK: 17.0000 g | PACK | Freq: Every day | ORAL | 0 refills | Status: DC | PRN

## 2017-11-03 MED ORDER — GLUCERNA SHAKE PO LIQD: 237.0000 mL | Freq: Two times a day (BID) | ORAL | 0 refills | Status: DC

## 2017-11-03 MED ORDER — OXYCODONE HCL 5 MG PO TABS: ORAL_TABLET | ORAL | 0 refills | Status: DC

## 2017-11-03 MED ORDER — OXYCODONE HCL 10 MG PO TABS: 10.0000 mg | ORAL_TABLET | Freq: Four times a day (QID) | ORAL | 0 refills | Status: DC | PRN

## 2017-11-03 MED ORDER — DOCUSATE SODIUM 100 MG PO CAPS: 100.0000 mg | ORAL_CAPSULE | Freq: Two times a day (BID) | ORAL | 0 refills | Status: DC

## 2017-11-03 MED ORDER — PREMIER PROTEIN SHAKE: 11.0000 [oz_av] | Freq: Every day | ORAL | 0 refills | Status: DC

## 2017-11-03 NOTE — Discharge Summary (Signed)
Physician Discharge Summary  Nester Bachus ZOX:[id number] DOB: [birth year] DOA: 10/25/2017  PCP: [name], MD  Admit date: 10/25/2017 Discharge date: 11/03/2017  Admitted From: Home  Disposition:  SNF  Recommendations for Outpatient Follow-up:  1. Follow up with PCP in 1-2 weeks 2. Please obtain BMP/CBC in one week Needs to follow up with Dr Sharol Given for further care.     Discharge Condition: stable.  CODE STATUS: full code.  Diet recommendation: Heart Healthy   Brief/Interim Summary:  Brief Narrative: Mr. Adkison is a 62 year old male with history of diabetes mellitus type 2, hypertension, CK-MB 3, severe peripheral vascular disease status post right AKA and left BKA last year presented to the ED with worsening ulcer close to his left BKA site with increasing pain and discharge and AKI on CKD3. -Improving on Abx, MRIcompleted positive for tibial stump osteomyelitis, Ortho Dr.Duda consulting, plan for AKA 11-01-2017   Assessment & Plan:   Principal Problem:   Cellulitis Active Problems:   AKI (acute kidney injury) ([place])   HTN (hypertension)   Hypothyroidism   Diabetes type 2, uncontrolled ([place])   ARF (acute renal failure) ([place])   Diabetic polyneuropathy associated with type 2 diabetes mellitus ([place])   Skin ulcer ([place])   Atherosclerosis of native arteries of extremities with gangrene, left leg ([place])   Dehiscence of amputation stump ([place])    BKA stump wound with secondary infection and osteomyelitis Treated with  IV Vancomycin  and ceftriaxone. Plan to keep IV antibiotics for 24 hours after surgery  MRI concerning for Osteomyelitis, ESR-97 Dr Sharol Given consulted, S/P  AKA 5-22.  Plan to discharge patient with provena wound vac.  Needs SNF.  Needs to follow up post op with Dr Sharol Given in 1 week.   AKI; metabolic acidosis.  Cr baseline 1.6, 3 on admission.  Holding ACE.  Continue to improved with fluids. Metabolic acidosis improved.  Replete k.  Improved, hold  lisinopril at discharge   DM;  SSI.  Resume lantus   Severe PVD;  Status post right AKA and left BKA   History of CAD -Stable, continue aspirin, Coreg and statin  Anemia; acute blood loss post surgery. Hb stable at 9    Discharge Diagnoses:  Principal Problem:   Cellulitis Active Problems:   AKI (acute kidney injury) ([facility] [facility])   HTN (hypertension)   Hypothyroidism   Diabetes type 2, uncontrolled ([place])   ARF (acute renal failure) ([facility])   Diabetic polyneuropathy associated with type 2 diabetes mellitus ([place])   Skin ulcer ([place])   Atherosclerosis of native arteries of extremities with gangrene, left leg ([place])   Dehiscence of amputation stump [facility])    Discharge Instructions  Discharge Instructions    Diet - low sodium heart healthy   Complete by:  As directed    Increase activity slowly   Complete by:  As directed    Negative Pressure Wound Therapy - Incisional   Complete by:  As directed    Change hospital VAC to the Praveena portable wound VAC pump at time of discharge.  The Praveena wound VAC pump can be obtained from the operating room.     Allergies as of 11/03/2017      Reactions   Lisinopril Other (See Comments)   Hyperkalemia / Renal failure      Medication List    STOP taking these medications   lisinopril 40 MG tablet Commonly known as:  PRINIVIL,ZESTRIL   loperamide 2 MG capsule Commonly known as:  IMODIUM   traMADol 50  MG tablet Commonly known as:  ULTRAM     TAKE these medications   allopurinol 100 MG tablet Commonly known as:  ZYLOPRIM Take 1 tablet (100 mg total) by mouth daily.   ASPIRIN 81 81 MG EC tablet Generic drug:  aspirin Take 1 tablet by mouth daily.   atorvastatin 40 MG tablet Commonly known as:  LIPITOR Take 1 tablet (40 mg total) by mouth daily at 6 PM.   carvedilol 3.125 MG tablet Commonly known as:  COREG Take 1 tablet (3.125 mg total) by mouth 2 (two) times daily with a meal.   docusate sodium 100 MG  capsule Commonly known as:  COLACE Take 1 capsule (100 mg total) by mouth 2 (two) times daily.   feeding supplement (GLUCERNA SHAKE) Liqd Take 237 mLs by mouth 2 (two) times daily between meals.   Insulin Glargine 100 UNIT/ML Solostar Pen Commonly known as:  LANTUS SOLOSTAR Inject 10 Units into the skin daily.   levothyroxine 75 MCG tablet Commonly known as:  SYNTHROID, LEVOTHROID Take 1 tablet (75 mcg total) by mouth daily before breakfast.   megestrol 400 MG/10ML suspension Commonly known as:  MEGACE Take 10 mLs (400 mg total) by mouth 2 (two) times daily. For appetite stimulation What changed:    when to take this  reasons to take this  additional instructions   Oxycodone HCl 10 MG Tabs Take 1-1.5 tablets (10-15 mg total) by mouth every 6 (six) hours as needed for severe pain (pain score 7-10).   polyethylene glycol packet Commonly known as:  MIRALAX / GLYCOLAX Take 17 g by mouth daily as needed for mild constipation.   protein supplement shake Liqd Commonly known as:  PREMIER PROTEIN Take 325 mLs (11 oz total) by mouth daily with breakfast.       Contact information for follow-up providers    Newt Minion, MD In 1 week.   Specialty:  Orthopedic Surgery Contact information: [place] [address] [phone]            Contact information for after-discharge care    Destination    [facility] .   Service:  Skilled Nursing Contact information: [address] [place] [place] [address] [phone]                 Allergies  Allergen Reactions  . Lisinopril Other (See Comments)    Hyperkalemia / Renal failure    Consultations: Dr Sharol Given  Procedures/Studies: Dg Tibia/fibula Left  Result Date: 10/25/2017 CLINICAL DATA:  Purulence drainage from stump. Below the knee amputation last March. EXAM: LEFT TIBIA AND FIBULA - 2 VIEW COMPARISON:  None. FINDINGS: There is a mottled lucent  appearance to the distal femur and remaining tibia/fibula. No discrete erosion is seen. No visible joint effusion. There is skin thickening about the stump with soft tissue gas anterior to the tibial tuberosity and at the level of the osteotomy. No opaque foreign body. Atherosclerotic calcification. IMPRESSION: 1. Stump soft tissue swelling with anterior skin irregularity and gas. Ulcer or necrotizing infection could give this radiologic appearance. 2. Mottled appearance of the distal femur and proximal tibia, extent favoring disuse osteopenia over osteomyelitis. Electronically Signed   By: Monte Fantasia M.D.   On: 10/25/2017 14:42   US Renal  Result Date: 10/26/2017 CLINICAL DATA:  Acute onset of renal failure. EXAM: RENAL / URINARY TRACT ULTRASOUND COMPLETE COMPARISON:  Renal ultrasound performed 08/31/2016 FINDINGS: Right Kidney: Length: 10.2 cm. Mildly  increased parenchymal echogenicity is noted. No mass or hydronephrosis visualized. Left Kidney: Length: 10.2 cm. Mildly increased parenchymal echogenicity is noted. No mass or hydronephrosis visualized. Bladder: Appears normal for degree of bladder distention. IMPRESSION: No evidence of hydronephrosis. Mildly increased renal parenchymal echogenicity may reflect medical renal disease. Electronically Signed   By: Garald Balding M.D.   On: 10/26/2017 02:39   Mr Tibia Fibula Left Wo Contrast  Result Date: 10/29/2017 CLINICAL DATA:  Left below-knee amputation stump wound. Evaluate for osteomyelitis. EXAM: MRI OF LOWER LEFT EXTREMITY WITHOUT CONTRAST TECHNIQUE: Multiplanar, multisequence MR imaging of the left knee was performed. No intravenous contrast was administered. COMPARISON:  Left knee x-rays dated Oct 25, 2017. FINDINGS: Bones/Joint/Cartilage There is soft tissue ulceration over the tibial tuberosity. There is loss of the normal cortex over the anterior proximal tibia deep to the ulcer, with increased T2 marrow signal within the proximal tibial  metaphysis tracking anteriorly to the tibial tuberosity and overlying ulcer. No significant knee joint effusion. No acute fracture or dislocation. Ligaments The knee ligaments and menisci are grossly intact. Muscles and Tendons Mild edema within the proximal anterior tibialis, extensor digitorum longus, and posterior tibialis muscles. Mild diffuse muscle atrophy. The extensor mechanism is intact. Soft tissues No fluid collection or soft tissue mass. IMPRESSION: 1. Soft tissue ulceration overlying the tibial tuberosity extending to bone, with evidence of underlying proximal tibial osteomyelitis. 2. Mild edema within the proximal anterior tibialis, extensor digitorum longus, and posterior tibialis muscles along the lateral aspect of the proximal tibia. Given their proximity to the soft tissue ulcer, infectious myositis is not excluded. 3. No drainable fluid collection. Electronically Signed   By: Titus Dubin M.D.   On: 10/29/2017 14:35     Subjective: Feeling well, had BM. Pain controlled.   Discharge Exam: Vitals:   11/02/17 2236 11/03/17 0622  BP: (!) 81/49 130/72  Pulse: 80 77  Resp: 18 18  Temp: 98.1 F (36.7 C) 98.6 F (37 C)  SpO2: 100% 100%   Vitals:   11/02/17 1212 11/02/17 1619 11/02/17 2236 11/03/17 0622  BP: 108/66 102/65 (!) 81/49 130/72  Pulse: 77 81 80 77  Resp:  _0 Temp: 98.4 F (36.9 C) 98.2 F (36.8 C) 98.1 F (36.7 C) 98.6 F (37 C)  TempSrc: Oral Oral Oral Oral  SpO2: 100% 97% 100% 100%  Weight:      Height:        General: Pt is alert, awake, not in acute distress Cardiovascular: RRR, S1/S2 +, no rubs, no gallops Respiratory: CTA bilaterally, no wheezing, no rhonchi Abdominal: Soft, NT, ND, bowel sounds + Extremities: Wound vac on left and AKA also on right     The results of significant diagnostics from this hospitalization (including imaging, microbiology, ancillary and laboratory) are listed below for reference.     Microbiology: Recent  Results (from the past 240 hour(s))  MRSA PCR Screening     Status: None   Collection Time: 10/26/17  1:53 AM  Result Value Ref Range Status   MRSA by PCR NEGATIVE NEGATIVE Final    Comment:        The GeneXpert MRSA Assay (FDA approved for NASAL specimens only), is one component of a comprehensive MRSA colonization surveillance program. It is not intended to diagnose MRSA infection nor to guide or monitor treatment for MRSA infections. Performed at [facility], [place] [address], [place], [place] [address]   Surgical pcr screen     Status:  None   Collection Time: 11/01/17  1:24 AM  Result Value Ref Range Status   MRSA, PCR NEGATIVE NEGATIVE Final   Staphylococcus aureus NEGATIVE NEGATIVE Final    Comment: (NOTE) The Xpert SA Assay (FDA approved for NASAL specimens in patients 6 years of age and older), is one component of a comprehensive surveillance program. It is not intended to diagnose infection nor to guide or monitor treatment. Performed at [facility], [place] [address], [place], [place] [address]      Labs: BNP (last 3 results) No results for input(s): BNP in the last 8760 hours. Basic Metabolic Panel: Recent Labs  Lab 10/28/17 0331 10/28/17 1534 10/29/17 0415 11/01/17 1511 11/02/17 0504 11/03/17 0626  NA 140  --  139 141 141 143  K 4.1  --  4.0 4.0 3.3* 3.8  CL 118*  --  114* 110 113* 115*  CO2 13*  --  16* 19* 18* 18*  GLUCOSE 125*  --  82 92 116* 99  BUN 41*  --  23* _0 CREATININE 1.73*  --  1.43* 1.36* 1.27* 1.25*  CALCIUM 7.9*  --  8.1* 8.4* 7.6* 7.6*  MG  --  1.7  --   --   --   --    Liver Function Tests: No results for input(s): AST, ALT, ALKPHOS, BILITOT, PROT, ALBUMIN in the last 168 hours. No results for input(s): LIPASE, AMYLASE in the last 168 hours. No results for input(s): AMMONIA in the last 168 hours. CBC: Recent Labs  Lab 10/28/17 0331 10/29/17 0415 11/01/17 1511 11/02/17 0504 11/03/17 0626  WBC 16.8* 18.5*  16.8* 16.0* 15.7*  HGB 11.0* 10.7* 11.2* 9.5* 9.3*  HCT 33.6* 32.9* 35.1* 29.8* 29.2*  MCV 83.8 83.5 84.8 84.2 85.1  PLT 737* 784* 709* 720* 673*   Cardiac Enzymes: No results for input(s): CKTOTAL, CKMB, CKMBINDEX, TROPONINI in the last 168 hours. BNP: Invalid input(s): POCBNP CBG: Recent Labs  Lab 11/02/17 0811 11/02/17 1210 11/02/17 1722 11/02/17 2242 11/03/17 0822  GLUCAP 91 98 119* 115* 93   D-Dimer No results for input(s): DDIMER in the last 72 hours. Hgb A1c No results for input(s): HGBA1C in the last 72 hours. Lipid Profile No results for input(s): CHOL, HDL, LDLCALC, TRIG, CHOLHDL, LDLDIRECT in the last 72 hours. Thyroid function studies No results for input(s): TSH, T4TOTAL, T3FREE, THYROIDAB in the last 72 hours.  Invalid input(s): FREET3 Anemia work up No results for input(s): VITAMINB12, FOLATE, FERRITIN, TIBC, IRON, RETICCTPCT in the last 72 hours. Urinalysis    Component Value Date/Time   COLORURINE YELLOW 10/25/2017 1303   APPEARANCEUR CLEAR 10/25/2017 1303   APPEARANCEUR Clear 05/16/2017 1201   LABSPEC 1.018 10/25/2017 1303   PHURINE 5.0 10/25/2017 1303   GLUCOSEU NEGATIVE 10/25/2017 1303   HGBUR NEGATIVE 10/25/2017 1303   BILIRUBINUR NEGATIVE 10/25/2017 1303   BILIRUBINUR Negative 05/16/2017 1201   KETONESUR NEGATIVE 10/25/2017 1303   PROTEINUR NEGATIVE 10/25/2017 1303   NITRITE NEGATIVE 10/25/2017 1303   LEUKOCYTESUR NEGATIVE 10/25/2017 1303   LEUKOCYTESUR Negative 05/16/2017 1201   Sepsis Labs Invalid input(s): PROCALCITONIN,  WBC,  LACTICIDVEN Microbiology Recent Results (from the past 240 hour(s))  MRSA PCR Screening     Status: None   Collection Time: 10/26/17  1:53 AM  Result Value Ref Range Status   MRSA by PCR NEGATIVE NEGATIVE Final    Comment:        The GeneXpert MRSA Assay (FDA approved for NASAL specimens only),  is one component of a comprehensive MRSA colonization surveillance program. It is not intended to diagnose  MRSA infection nor to guide or monitor treatment for MRSA infections. Performed at [facility], [place] [address] [place], [place] [address]   Surgical pcr screen     Status: None   Collection Time: 11/01/17  1:24 AM  Result Value Ref Range Status   MRSA, PCR NEGATIVE NEGATIVE Final   Staphylococcus aureus NEGATIVE NEGATIVE Final    Comment: (NOTE) The Xpert SA Assay (FDA approved for NASAL specimens in patients 85 years of age and older), is one component of a comprehensive surveillance program. It is not intended to diagnose infection nor to guide or monitor treatment. Performed at [facility], [place] [address], [place], [place] [address]      Time coordinating discharge: 35 minutes.   SIGNED:   Elmarie Shiley, MD  [facility] 11/03/2017, 9:04 AM Pager   If 7PM-7AM, please contact night-coverage [email or web address] Password [email or web address]

## 2017-11-03 NOTE — Progress Notes (Signed)
Patient will DC to: [facility] date: 11/03/17 Family notified: Sister, [name] Transport by: Sharl Ma   Per MD patient ready for DC to [facility]. RN, patient, patient's family, and facility notified of DC. Discharge Summary sent to facility. RN given number for report. DC packet on chart. Ambulance transport requested for patient.   CSW signing off.  Cedric Fishman, LCSW Clinical Social Worker [phone]

## 2017-11-03 NOTE — Clinical Social Work Placement (Signed)
   CLINICAL SOCIAL WORK PLACEMENT  NOTE  Date:  11/03/2017  Patient Details  Name: Todd [name] MRN: [id number] Date of Birth: 1956/03/17  Clinical Social Work is seeking post-discharge placement for this patient at the [facility] level of care (*CSW will initial, date and re-position this form in  chart as items are completed):  Yes   Patient/family provided with [facility] Work Department's list of facilities offering this level of care within the geographic area requested by the patient (or if unable, by the patient's family).  Yes   Patient/family informed of their freedom to choose among providers that offer the needed level of care, that participate in Medicare, Medicaid or managed care program needed by the patient, have an available bed and are willing to accept the patient.  Yes   Patient/family informed of [facility]'s ownership interest in [facility] and [facility], as well as of the fact that they are under no obligation to receive care at these facilities.  PASRR submitted to EDS on       PASRR number received on       Existing PASRR number confirmed on 11/03/17     FL2 transmitted to all facilities in geographic area requested by pt/family on 11/03/17     FL2 transmitted to all facilities within larger geographic area on       Patient informed that his/her managed care company has contracts with or will negotiate with certain facilities, including the following:        Yes   Patient/family informed of bed offers received.  Patient chooses bed at [facility]     Physician recommends and patient chooses bed at      Patient to be transferred to [facility] on 11/03/17.  Patient to be transferred to facility by [facility]     Patient family notified on 11/03/17 of transfer.  Name of family member notified:  [name], sister     PHYSICIAN Please sign FL2      Additional Comment:    _______________________________________________ Benard Halsted, LCSWA 11/03/2017, 4:14 PM

## 2017-11-03 NOTE — Telephone Encounter (Signed)
Rx faxed to [facility] (P) [phone], (F) [phone] 

## 2017-11-03 NOTE — Progress Notes (Signed)
Physical Therapy Treatment Patient Details Name: Todd [name] MRN: [id number] DOB: [birth year] Today's Date: 11/03/2017    History of Present Illness Pt is a 62 y.o. male with PMH of DMII, HTN, CK-MB 3, severe PVD s/p R AKA and L BKA (2018), who presented 10/25/17 with worsening ulcer near left BKA site with increasing pain. Now s/p L AKA on 5/22 with VAC placement.   PT Comments    Pt remains limited by pain, not agreeable for OOB mobility secondary to this. Significant hip flexion tightness noted in BLEs, pt reports he sits in chair or sits up in bed most of day. Initiated hip flexor stretching in supine with HOB elevated and slowly lowering bed to achieve fully flat; today pt able to eventually tolerate laying at 0' with 2 pillows propped behind back. Encouraged to perform this stretch at SNF and home since pt has hospital bed. Continue to recommend SNF-level therapies to maximize functional mobility and independence prior tor return home.   Follow Up Recommendations  SNF;Supervision for mobility/OOB     Equipment Recommendations  None recommended by PT    Recommendations for Other Services       Precautions / Restrictions Precautions Precautions: Fall Precaution Comments: L AKA wound vac Restrictions Weight Bearing Restrictions: No(AKA)    Mobility  Bed Mobility Overal bed mobility: Needs Assistance Bed Mobility: Rolling;Supine to Sit Rolling: Supervision   Supine to sit: Supervision     General bed mobility comments: Pt able to roll towards R-side for repositioning; relies on bed rails to pull into long sitting  Transfers                 General transfer comment: Adamantly declining OOB mobility, including transfer to recliner  Ambulation/Gait                 Stairs             Wheelchair Mobility    Modified Rankin (Stroke Patients Only)       Balance Overall balance assessment: Needs assistance Sitting-balance support: No upper  extremity supported Sitting balance-Leahy Scale: Fair Sitting balance - Comments: long sits in bed without assist, limited wt shifting d/t pain                                      Cognition Arousal/Alertness: Awake/alert Behavior During Therapy: WFL for tasks assessed/performed Overall Cognitive Status: No family/caregiver present to determine baseline cognitive functioning Area of Impairment: Attention                   Current Attention Level: Selective           General Comments: Likely baseline cognition      Exercises Other Exercises Other Exercises: Hip flexor stretching laying supine and lowering HOB, required 10-15' increments due to significant stretch    General Comments General comments (skin integrity, edema, etc.): SpO2 100% on RA      Pertinent Vitals/Pain Pain Assessment: Faces Faces Pain Scale: Hurts even more Pain Location: L residual limb incision Pain Descriptors / Indicators: Constant;Grimacing;Sore Pain Intervention(s): Monitored during session;Limited activity within patient's tolerance    Home Living                      Prior Function            PT Goals (current goals can now be found in the  care plan section) Acute Rehab PT Goals Patient Stated Goal: Go to rehab today PT Goal Formulation: With patient Time For Goal Achievement: 11/16/17 Potential to Achieve Goals: Good Progress towards PT goals: Not progressing toward goals - comment(Limited by pain; not agreeable to OOB mobility )    Frequency    Min 2X/week      PT Plan Discharge plan needs to be updated;Frequency needs to be updated    Co-evaluation              AM-PAC PT "6 Clicks" Daily Activity  Outcome Measure  Difficulty turning over in bed (including adjusting bedclothes, sheets and blankets)?: A Little Difficulty moving from lying on back to sitting on the side of the bed? : Unable Difficulty sitting down on and standing up  from a chair with arms (e.g., wheelchair, bedside commode, etc,.)?: Unable Help needed moving to and from a bed to chair (including a wheelchair)?: A Lot Help needed walking in hospital room?: Total Help needed climbing 3-5 steps with a railing? : Total 6 Click Score: 9    End of Session   Activity Tolerance: Patient limited by pain Patient left: in bed;with call bell/phone within reach;with bed alarm set Nurse Communication: Mobility status PT Visit Diagnosis: Pain Pain - Right/Left: Left Pain - part of body: Leg     Time: 1038-1050 PT Time Calculation (min) (ACUTE ONLY): 12 min  Charges:  $Therapeutic Exercise: 8-22 mins                    G Codes:      Mabeline Caras, PT, DPT Acute Rehab Services  Pager: [place] 11/03/2017, 11:12 AM

## 2017-11-03 NOTE — Progress Notes (Addendum)
4pm-Gloria has accepted bed offer at [facility]   3pm-[facility] states they can accept patient on a 30 day letter of guarantee approved by [employer], quantity until [facility] is activated in the system. [name] and patient are aware that facility may take his Medicaid check after 30 days. Patient's sister is going to check with the rest of the family to make sure it is ok.   1pm-CSW has been speaking with patient's sister, [name] [phone]) with patient's permission. She requested that patient go to [facility]. CSW explained that [facility] does not take patients with no insurance or short term Medicaid. She stated that patient is having his Medicaid re-activated. CSW spoke with patient's DSS worker [place] Dugens [phone]). She stated she will submit info into the system today, but is not sure when it will show up in the system for SNFs to verify it. CSW faxed her requested hospital admission date. CSW let [name] know that SNFs will not accept patient until the Medicaid shows up in the system and we will have to proceed with a Letter of Guarantee to [place]. They questioned whether patient could later transfer to [facility] after Medicaid is verified. CSW is confirming that info.  Percell Locus Natonya Finstad LCSW [phone]

## 2017-11-03 NOTE — Progress Notes (Signed)
[name] to be D/C'd Skilled nursing facility per MD order.  Discussed prescriptions and follow up appointments with the patient. Prescriptions given to patient, medication list explained in detail. Pt verbalized understanding.  Allergies as of 11/03/2017      Reactions   Lisinopril Other (See Comments)   Hyperkalemia / Renal failure      Medication List    STOP taking these medications   lisinopril 40 MG tablet Commonly known as:  PRINIVIL,ZESTRIL   loperamide 2 MG capsule Commonly known as:  IMODIUM   traMADol 50 MG tablet Commonly known as:  ULTRAM     TAKE these medications   allopurinol 100 MG tablet Commonly known as:  ZYLOPRIM Take 1 tablet (100 mg total) by mouth daily.   ASPIRIN 81 81 MG EC tablet Generic drug:  aspirin Take 1 tablet by mouth daily.   atorvastatin 40 MG tablet Commonly known as:  LIPITOR Take 1 tablet (40 mg total) by mouth daily at 6 PM.   carvedilol 3.125 MG tablet Commonly known as:  COREG Take 1 tablet (3.125 mg total) by mouth 2 (two) times daily with a meal.   docusate sodium 100 MG capsule Commonly known as:  COLACE Take 1 capsule (100 mg total) by mouth 2 (two) times daily.   feeding supplement (GLUCERNA SHAKE) Liqd Take 237 mLs by mouth 2 (two) times daily between meals.   Insulin Glargine 100 UNIT/ML Solostar Pen Commonly known as:  LANTUS SOLOSTAR Inject 10 Units into the skin daily.   levothyroxine 75 MCG tablet Commonly known as:  SYNTHROID, LEVOTHROID Take 1 tablet (75 mcg total) by mouth daily before breakfast.   megestrol 400 MG/10ML suspension Commonly known as:  MEGACE Take 10 mLs (400 mg total) by mouth 2 (two) times daily. For appetite stimulation What changed:    when to take this  reasons to take this  additional instructions   polyethylene glycol packet Commonly known as:  MIRALAX / GLYCOLAX Take 17 g by mouth daily as needed for mild constipation.   protein supplement shake Liqd Commonly known  as:  PREMIER PROTEIN Take 325 mLs (11 oz total) by mouth daily with breakfast.       Vitals:   11/03/17 0622 11/03/17 1446  BP: 130/72 103/67  Pulse: 77 81  Resp: 18 17  Temp: 98.6 F (37 C) 98.9 F (37.2 C)  SpO2: 100% 99%    Skin clean, dry and intact without evidence of skin break down, no evidence of skin tears noted. IV catheter discontinued intact. Site without signs and symptoms of complications. Dressing and pressure applied. Praveena wound vac in place. No complaints noted.  An After Visit Summary was printed and placed in packet with PTAR. Patient escorted via stretcher. Chapman Fitch BSN, RN [employer] [address] Phone [phone]

## 2017-11-03 NOTE — Progress Notes (Signed)
Todd [name] wound vac applied in anticipation of D/C. Education provided and pt verbalized understanding.

## 2017-11-07 ENCOUNTER — Telehealth (INDEPENDENT_AMBULATORY_CARE_PROVIDER_SITE_OTHER): Payer: Self-pay

## 2017-11-07 NOTE — Telephone Encounter (Signed)
Jennifer with [facility] called stating patient's wound Vac stopped working over the weekend and so they removed the wound vac and ordered a KCI wound Vac for patient.  Would like to know what the  settings/pressure needs to be?  CB# is [phone].  Please advise.  Thank you.

## 2017-11-08 NOTE — Telephone Encounter (Signed)
Called pt is s/p a Left AKA 11/01/17 pt's vac stopped working over weekend and they removed it. Advised they do not need to reapply another vac it was a disposable one and now that it is off just to apply a dry dressing ( 4x4, Kerlix and ace) daily and follow up with Dr. Sharol Given in office 11/16/17. To call with any questions.

## 2017-11-09 ENCOUNTER — Ambulatory Visit (INDEPENDENT_AMBULATORY_CARE_PROVIDER_SITE_OTHER): Payer: Self-pay | Admitting: Orthopedic Surgery

## 2017-11-16 ENCOUNTER — Encounter (INDEPENDENT_AMBULATORY_CARE_PROVIDER_SITE_OTHER): Payer: Self-pay | Admitting: Orthopedic Surgery

## 2017-11-16 ENCOUNTER — Ambulatory Visit (INDEPENDENT_AMBULATORY_CARE_PROVIDER_SITE_OTHER): Payer: Medicaid Other | Admitting: Orthopedic Surgery

## 2017-11-16 DIAGNOSIS — S78112A Complete traumatic amputation at level between left hip and knee, initial encounter: Secondary | ICD-10-CM | POA: Insufficient documentation

## 2017-11-16 DIAGNOSIS — Z89612 Acquired absence of left leg above knee: Secondary | ICD-10-CM

## 2017-11-16 NOTE — Progress Notes (Signed)
Office Visit Note   Patient: Todd [name]           Date of Birth: 1955-10-05           MRN: [id number] Visit Date: 11/16/2017              Requested by: [name], MD [place],  [id number] PCP: [name], MD  Chief Complaint  Patient presents with  . Left Leg - Follow-up, Routine Post Op      HPI: Patient is a 62 year old gentleman status post left above-knee amputation.  Patient states he does have some pain.  Assessment & Plan: Visit Diagnoses:  1. Above knee amputation of left lower extremity ([place])     Plan: Plan for Dial soap cleansing daily dry dressing change daily follow-up in 2 weeks to remove the sutures and staples.  Follow-Up Instructions: Return in about 2 weeks (around 11/30/2017).   Ortho Exam  Patient is alert, oriented, no adenopathy, well-dressed, normal affect, normal respiratory effort. Patient is currently at skilled nursing seen by EMS transport.  Examination the left above-knee amputation the incision is well-healed there is no drainage no cellulitis no wound dehiscence no signs of infection.  We will reapply a dry dressing.  Imaging: No results found. No images are attached to the encounter.  Labs: Lab Results  Component Value Date   HGBA1C 6.8 (H) 08/08/2016   HGBA1C 14.3 (H) 12/21/2015   ESRSEDRATE 97 (H) 10/27/2017   ESRSEDRATE 138 (H) 09/08/2016   ESRSEDRATE 117 (H) 08/08/2016   CRP 7.1 (H) 09/08/2016   CRP 3.2 (H) 08/08/2016   LABURIC 9.5 (H) 11/11/2016   LABURIC 9.2 (H) 07/11/2016   LABURIC 11.4 (H) 07/04/2016   REPTSTATUS 09/13/2016 FINAL 09/08/2016   REPTSTATUS 09/13/2016 FINAL 09/08/2016   CULT NO GROWTH 5 DAYS 09/08/2016   CULT NO GROWTH 5 DAYS 09/08/2016     Lab Results  Component Value Date   ALBUMIN 3.7 10/25/2017   ALBUMIN 4.2 10/24/2017   ALBUMIN 4.4 08/15/2017   PREALBUMIN 8.0 (L) 08/08/2016   LABURIC 9.5 (H) 11/11/2016   LABURIC 9.2 (H) 07/11/2016   LABURIC 11.4 (H) 07/04/2016     There is no height or weight on file to calculate BMI.  Orders:  No orders of the defined types were placed in this encounter.  No orders of the defined types were placed in this encounter.    Procedures: No procedures performed  Clinical Data: No additional findings.  ROS:  All other systems negative, except as noted in the HPI. Review of Systems  Objective: Vital Signs: There were no vitals taken for this visit.  Specialty Comments:  No specialty comments available.  PMFS History: Patient Active Problem List   Diagnosis Date Noted  . Above knee amputation of left lower extremity ([place]) 11/16/2017  . Atherosclerosis of native arteries of extremities with gangrene, left leg ([place])   . Dehiscence of amputation stump ([place])   . Cellulitis 10/25/2017  . Wound infection   . Urinary incontinence 08/14/2017  . Skin ulcer ([place]) 07/28/2017  . History of right above knee amputation ([place]) 09/21/2016  . Acquired absence of left leg below knee ([place]) 09/21/2016  . Transaminitis   . Diabetic polyneuropathy associated with type 2 diabetes mellitus ([place]) 07/21/2016  . ARF (acute renal failure) ([place]) 07/04/2016  . Hyperuricemia 06/21/2016  . HTN (hypertension) 01/05/2016  . Diabetes type 2, uncontrolled ([place]) 01/05/2016  . AKI (acute kidney injury) ([place]) 12/20/2015  . Hypothyroidism  05/10/2011   Past Medical History:  Diagnosis Date  . AKI (acute kidney injury) ([place])   . Constipated   . Diabetes mellitus without complication ([place])   . Diarrhea   . Elevated LFTs   . Goiter   . Gout   . Hyperlipidemia   . Hypertension   . Leukocytosis   . Reactive thrombocytosis   . Right BKA infection ([place]) 08/2016  . Right leg pain   . Sepsis due to undetermined organism ([place])   . Thyroid disease   . Wound infection after surgery 08/2016    Family History  Problem Relation Age of Onset  . Heart disease Mother   . Pneumonia Father   . Diabetes Maternal Aunt   . Diabetes Maternal  Uncle     Past Surgical History:  Procedure Laterality Date  . ABDOMINAL AORTOGRAM N/A 08/11/2016   Procedure: Abdominal Aortogram;  Surgeon: Waynetta Sandy, MD;  Location: [facility];  Service: Cardiovascular;  Laterality: N/A;  . ABDOMINAL AORTOGRAM W/LOWER EXTREMITY N/A 08/15/2016   Procedure: Abdominal Aortogram w/Lower Extremity;  Surgeon: Elam Dutch, MD;  Location: [facility];  Service: Cardiovascular;  Laterality: N/A;  . AMPUTATION Right 08/17/2016   Procedure: RIGHT BELOW KNEE AMPUTATION;  Surgeon: Elam Dutch, MD;  Location: [facility];  Service: Vascular;  Laterality: Right;  . AMPUTATION Right 09/12/2016   Procedure: AMPUTATION ABOVE KNEE;  Surgeon: Newt Minion, MD;  Location: [facility];  Service: Orthopedics;  Laterality: Right;  . AMPUTATION Left 08/12/2016   Procedure: LEFT BELOW KNEE AMPUTATION;  Surgeon: Newt Minion, MD;  Location: [facility];  Service: Orthopedics;  Laterality: Left;  . AMPUTATION Left 11/01/2017   Procedure: LEFT ABOVE KNEE AMPUTATION;  Surgeon: Newt Minion, MD;  Location: [facility];  Service: Orthopedics;  Laterality: Left;  . APPLICATION OF WOUND VAC Right 09/12/2016   Procedure: APPLICATION OF WOUND VAC ABOVE KNEE;  Surgeon: Newt Minion, MD;  Location: [facility];  Service: Orthopedics;  Laterality: Right;  . LOWER EXTREMITY ANGIOGRAPHY Bilateral 08/11/2016   Procedure: Lower Extremity Angiography;  Surgeon: Waynetta Sandy, MD;  Location: [facility];  Service: Cardiovascular;  Laterality: Bilateral;  . PERIPHERAL VASCULAR BALLOON ANGIOPLASTY Left 08/11/2016   Procedure: Peripheral Vascular Balloon Angioplasty;  Surgeon: Waynetta Sandy, MD;  Location: [facility];  Service: Cardiovascular;  Laterality: Left;  SFA  . THYROID SURGERY     Social History   Occupational History  . Occupation: drives a Forensic scientist  Tobacco Use  . Smoking status: Current Every Day Smoker    Packs/day: 0.75    Years: 45.00    Pack  years: 33.75    Types: Cigarettes    Last attempt to quit: 11/12/2014    Years since quitting: 3.0  . Smokeless tobacco: Never Used  Substance and Sexual Activity  . Alcohol use: No    Alcohol/week: 0.0 oz    Comment: h/o heavy use; stopped drinking in 7/16  . Drug use: No  . Sexual activity: Not Currently

## 2017-11-30 ENCOUNTER — Ambulatory Visit (INDEPENDENT_AMBULATORY_CARE_PROVIDER_SITE_OTHER): Payer: Medicaid Other | Admitting: Orthopedic Surgery

## 2017-11-30 ENCOUNTER — Encounter (INDEPENDENT_AMBULATORY_CARE_PROVIDER_SITE_OTHER): Payer: Self-pay | Admitting: Orthopedic Surgery

## 2017-11-30 VITALS — Ht 68.0 in | Wt 145.0 lb

## 2017-11-30 DIAGNOSIS — Z89612 Acquired absence of left leg above knee: Secondary | ICD-10-CM

## 2017-11-30 DIAGNOSIS — S78112A Complete traumatic amputation at level between left hip and knee, initial encounter: Secondary | ICD-10-CM

## 2017-11-30 NOTE — Progress Notes (Signed)
Office Visit Note   Patient: Todd [name]           Date of Birth: 1955-07-12           MRN: [id number] Visit Date: 11/30/2017              Requested by: [name], MD [place], [place] [address] PCP: [name], MD  Chief Complaint  Patient presents with  . Left Leg - Routine Post Op    11/01/17 left above the knee amputation       HPI: Patient is a 62 year old gentleman who is 1 month status post a left above-knee amputation and previously status post a right above-the-knee amputation he has worked with biotech for the right leg and has worked with Animal nutritionist for Personnel officer.  Assessment & Plan: Visit Diagnoses:  1. Above knee amputation of left lower extremity ([place])     Plan: Patient is given a prescription for biotech for a k2 level prosthesis for his left above-knee amputation he will follow with biotech and Shirlean Mylar once this is fabricated.  Patient sutures and staples were harvested.  Follow-Up Instructions: Return in about 1 month (around 12/28/2017).   Ortho Exam  Patient is alert, oriented, no adenopathy, well-dressed, normal affect, normal respiratory effort. Examination of the AKA incision is well-healed sutures and staples are harvested patient is pleased with his progress he has no complaints no swelling no cellulitis.  Imaging: No results found. No images are attached to the encounter.  Labs: Lab Results  Component Value Date   HGBA1C 6.8 (H) 08/08/2016   HGBA1C 14.3 (H) 12/21/2015   ESRSEDRATE 97 (H) 10/27/2017   ESRSEDRATE 138 (H) 09/08/2016   ESRSEDRATE 117 (H) 08/08/2016   CRP 7.1 (H) 09/08/2016   CRP 3.2 (H) 08/08/2016   LABURIC 9.5 (H) 11/11/2016   LABURIC 9.2 (H) 07/11/2016   LABURIC 11.4 (H) 07/04/2016   REPTSTATUS 09/13/2016 FINAL 09/08/2016   REPTSTATUS 09/13/2016 FINAL 09/08/2016   CULT NO GROWTH 5 DAYS 09/08/2016   CULT NO GROWTH 5 DAYS 09/08/2016     Lab Results  Component Value Date   ALBUMIN 3.7 10/25/2017   ALBUMIN 4.2 10/24/2017   ALBUMIN 4.4 08/15/2017   PREALBUMIN 8.0 (L) 08/08/2016   LABURIC 9.5 (H) 11/11/2016   LABURIC 9.2 (H) 07/11/2016   LABURIC 11.4 (H) 07/04/2016    Body mass index is 22.05 kg/m.  Orders:  No orders of the defined types were placed in this encounter.  No orders of the defined types were placed in this encounter.    Procedures: No procedures performed  Clinical Data: No additional findings.  ROS:  All other systems negative, except as noted in the HPI. Review of Systems  Objective: Vital Signs: Ht 5\' 8"  (1.727 m)   Wt 145 lb (65.8 kg)   BMI 22.05 kg/m   Specialty Comments:  No specialty comments available.  PMFS History: Patient Active Problem List   Diagnosis Date Noted  . Above knee amputation of left lower extremity ([place]) 11/16/2017  . Atherosclerosis of native arteries of extremities with gangrene, left leg ([place])   . Dehiscence of amputation stump ([place])   . Cellulitis 10/25/2017  . Wound infection   . Urinary incontinence 08/14/2017  . Skin ulcer ([place]) 07/28/2017  . History of right above knee amputation ([place]) 09/21/2016  . Acquired absence of left leg below knee ([place]) 09/21/2016  . Transaminitis   . Diabetic polyneuropathy associated with type 2 diabetes mellitus ([place]) 07/21/2016  .  ARF (acute renal failure) ([place]) 07/04/2016  . Hyperuricemia 06/21/2016  . HTN (hypertension) 01/05/2016  . Diabetes type 2, uncontrolled ([place]) 01/05/2016  . AKI (acute kidney injury) ([place]) 12/20/2015  . Hypothyroidism 05/10/2011   Past Medical History:  Diagnosis Date  . AKI (acute kidney injury) ([place])   . Constipated   . Diabetes mellitus without complication ([place])   . Diarrhea   . Elevated LFTs   . Goiter   . Gout   . Hyperlipidemia   . Hypertension   . Leukocytosis   . Reactive thrombocytosis   . Right BKA infection ([place]) 08/2016  . Right leg pain   . Sepsis due to undetermined organism ([place])   . Thyroid disease   . Wound infection  after surgery 08/2016    Family History  Problem Relation Age of Onset  . Heart disease Mother   . Pneumonia Father   . Diabetes Maternal Aunt   . Diabetes Maternal Uncle     Past Surgical History:  Procedure Laterality Date  . ABDOMINAL AORTOGRAM N/A 08/11/2016   Procedure: Abdominal Aortogram;  Surgeon: Waynetta Sandy, MD;  Location: [facility];  Service: Cardiovascular;  Laterality: N/A;  . ABDOMINAL AORTOGRAM W/LOWER EXTREMITY N/A 08/15/2016   Procedure: Abdominal Aortogram w/Lower Extremity;  Surgeon: Elam Dutch, MD;  Location: [facility];  Service: Cardiovascular;  Laterality: N/A;  . AMPUTATION Right 08/17/2016   Procedure: RIGHT BELOW KNEE AMPUTATION;  Surgeon: Elam Dutch, MD;  Location: [facility];  Service: Vascular;  Laterality: Right;  . AMPUTATION Right 09/12/2016   Procedure: AMPUTATION ABOVE KNEE;  Surgeon: Newt Minion, MD;  Location: [facility];  Service: Orthopedics;  Laterality: Right;  . AMPUTATION Left 08/12/2016   Procedure: LEFT BELOW KNEE AMPUTATION;  Surgeon: Newt Minion, MD;  Location: [facility];  Service: Orthopedics;  Laterality: Left;  . AMPUTATION Left 11/01/2017   Procedure: LEFT ABOVE KNEE AMPUTATION;  Surgeon: Newt Minion, MD;  Location: [facility];  Service: Orthopedics;  Laterality: Left;  . APPLICATION OF WOUND VAC Right 09/12/2016   Procedure: APPLICATION OF WOUND VAC ABOVE KNEE;  Surgeon: Newt Minion, MD;  Location: [facility];  Service: Orthopedics;  Laterality: Right;  . LOWER EXTREMITY ANGIOGRAPHY Bilateral 08/11/2016   Procedure: Lower Extremity Angiography;  Surgeon: Waynetta Sandy, MD;  Location: [facility];  Service: Cardiovascular;  Laterality: Bilateral;  . PERIPHERAL VASCULAR BALLOON ANGIOPLASTY Left 08/11/2016   Procedure: Peripheral Vascular Balloon Angioplasty;  Surgeon: Waynetta Sandy, MD;  Location: [facility];  Service: Cardiovascular;  Laterality: Left;  SFA  . THYROID SURGERY     Social  History   Occupational History  . Occupation: drives a Forensic scientist  Tobacco Use  . Smoking status: Current Every Day Smoker    Packs/day: 0.75    Years: 45.00    Pack years: 33.75    Types: Cigarettes    Last attempt to quit: 11/12/2014    Years since quitting: 3.0  . Smokeless tobacco: Never Used  Substance and Sexual Activity  . Alcohol use: No    Alcohol/week: 0.0 oz    Comment: h/o heavy use; stopped drinking in 7/16  . Drug use: No  . Sexual activity: Not Currently

## 2017-12-13 ENCOUNTER — Telehealth (INDEPENDENT_AMBULATORY_CARE_PROVIDER_SITE_OTHER): Payer: Self-pay

## 2017-12-13 NOTE — Telephone Encounter (Signed)
Called and lm on vm for pharm to advise that this has been approved through 06/11/18

## 2017-12-13 NOTE — Telephone Encounter (Signed)
Confirmation #: [id number] [address] [place] tracks prior auth for tramadol. Status suspended will hold message and check status.

## 2017-12-15 ENCOUNTER — Other Ambulatory Visit: Payer: Self-pay | Admitting: Family Medicine

## 2018-01-01 ENCOUNTER — Ambulatory Visit (INDEPENDENT_AMBULATORY_CARE_PROVIDER_SITE_OTHER): Payer: Medicaid Other | Admitting: Orthopedic Surgery

## 2018-01-01 ENCOUNTER — Encounter (INDEPENDENT_AMBULATORY_CARE_PROVIDER_SITE_OTHER): Payer: Self-pay | Admitting: Orthopedic Surgery

## 2018-01-01 DIAGNOSIS — IMO0002 Reserved for concepts with insufficient information to code with codable children: Secondary | ICD-10-CM

## 2018-01-01 DIAGNOSIS — S78112A Complete traumatic amputation at level between left hip and knee, initial encounter: Secondary | ICD-10-CM

## 2018-01-01 DIAGNOSIS — Z89612 Acquired absence of left leg above knee: Secondary | ICD-10-CM

## 2018-01-01 DIAGNOSIS — Z89611 Acquired absence of right leg above knee: Secondary | ICD-10-CM

## 2018-01-01 NOTE — Progress Notes (Signed)
Office Visit Note   Patient: Todd [name]           Date of Birth: April 06, 1956           MRN: [id number] Visit Date: 01/01/2018              Requested by: [name], MD [place], [place] [address] PCP: [name], MD  Chief Complaint  Patient presents with  . Right Knee - Pain  . Left Knee - Pain      HPI: Patient is a 62 year old gentleman who presents 2 months status post a left above-the-knee amputation he is also status post a right above-the-knee amputation over a year ago.  Assessment & Plan: Visit Diagnoses:  1. Above knee amputation of left lower extremity ([place])   2. Above knee amputation status, right ([place])     Plan: Patient was given instructions to follow-up with biotech for prosthetic fitting for both legs.  Patient will follow-up with [facility] for gait training.  Follow-Up Instructions: Return if symptoms worsen or fail to improve.   Ortho Exam  Patient is alert, oriented, no adenopathy, well-dressed, normal affect, normal respiratory effort. Examination both legs are well-healed good consolidation no cellulitis no ulcers no drainage.  Imaging: No results found. No images are attached to the encounter.  Labs: Lab Results  Component Value Date   HGBA1C 6.8 (H) 08/08/2016   HGBA1C 14.3 (H) 12/21/2015   ESRSEDRATE 97 (H) 10/27/2017   ESRSEDRATE 138 (H) 09/08/2016   ESRSEDRATE 117 (H) 08/08/2016   CRP 7.1 (H) 09/08/2016   CRP 3.2 (H) 08/08/2016   LABURIC 9.5 (H) 11/11/2016   LABURIC 9.2 (H) 07/11/2016   LABURIC 11.4 (H) 07/04/2016   REPTSTATUS 09/13/2016 FINAL 09/08/2016   REPTSTATUS 09/13/2016 FINAL 09/08/2016   CULT NO GROWTH 5 DAYS 09/08/2016   CULT NO GROWTH 5 DAYS 09/08/2016     Lab Results  Component Value Date   ALBUMIN 3.7 10/25/2017   ALBUMIN 4.2 10/24/2017   ALBUMIN 4.4 08/15/2017   PREALBUMIN 8.0 (L) 08/08/2016   LABURIC 9.5 (H) 11/11/2016   LABURIC 9.2 (H) 07/11/2016   LABURIC 11.4 (H) 07/04/2016    There  is no height or weight on file to calculate BMI.  Orders:  No orders of the defined types were placed in this encounter.  No orders of the defined types were placed in this encounter.    Procedures: No procedures performed  Clinical Data: No additional findings.  ROS:  All other systems negative, except as noted in the HPI. Review of Systems  Objective: Vital Signs: There were no vitals taken for this visit.  Specialty Comments:  No specialty comments available.  PMFS History: Patient Active Problem List   Diagnosis Date Noted  . Above knee amputation of left lower extremity ([place]) 11/16/2017  . Atherosclerosis of native arteries of extremities with gangrene, left leg ([place])   . Dehiscence of amputation stump ([place])   . Cellulitis 10/25/2017  . Wound infection   . Urinary incontinence 08/14/2017  . Skin ulcer ([place]) 07/28/2017  . History of right above knee amputation ([place]) 09/21/2016  . Acquired absence of left leg below knee ([place]) 09/21/2016  . Transaminitis   . Diabetic polyneuropathy associated with type 2 diabetes mellitus ([place]) 07/21/2016  . ARF (acute renal failure) ([place]) 07/04/2016  . Hyperuricemia 06/21/2016  . HTN (hypertension) 01/05/2016  . Diabetes type 2, uncontrolled ([place]) 01/05/2016  . AKI (acute kidney injury) ([place]) 12/20/2015  . Hypothyroidism 05/10/2011  Past Medical History:  Diagnosis Date  . AKI (acute kidney injury) ([place])   . Constipated   . Diabetes mellitus without complication ([place])   . Diarrhea   . Elevated LFTs   . Goiter   . Gout   . Hyperlipidemia   . Hypertension   . Leukocytosis   . Reactive thrombocytosis   . Right BKA infection ([place]) 08/2016  . Right leg pain   . Sepsis due to undetermined organism ([place])   . Thyroid disease   . Wound infection after surgery 08/2016    Family History  Problem Relation Age of Onset  . Heart disease Mother   . Pneumonia Father   . Diabetes Maternal Aunt   . Diabetes Maternal Uncle       Past Surgical History:  Procedure Laterality Date  . ABDOMINAL AORTOGRAM N/A 08/11/2016   Procedure: Abdominal Aortogram;  Surgeon: Waynetta Sandy, MD;  Location: [facility];  Service: Cardiovascular;  Laterality: N/A;  . ABDOMINAL AORTOGRAM W/LOWER EXTREMITY N/A 08/15/2016   Procedure: Abdominal Aortogram w/Lower Extremity;  Surgeon: Elam Dutch, MD;  Location: [facility];  Service: Cardiovascular;  Laterality: N/A;  . AMPUTATION Right 08/17/2016   Procedure: RIGHT BELOW KNEE AMPUTATION;  Surgeon: Elam Dutch, MD;  Location: [facility];  Service: Vascular;  Laterality: Right;  . AMPUTATION Right 09/12/2016   Procedure: AMPUTATION ABOVE KNEE;  Surgeon: Newt Minion, MD;  Location: [facility];  Service: Orthopedics;  Laterality: Right;  . AMPUTATION Left 08/12/2016   Procedure: LEFT BELOW KNEE AMPUTATION;  Surgeon: Newt Minion, MD;  Location: [facility];  Service: Orthopedics;  Laterality: Left;  . AMPUTATION Left 11/01/2017   Procedure: LEFT ABOVE KNEE AMPUTATION;  Surgeon: Newt Minion, MD;  Location: [facility];  Service: Orthopedics;  Laterality: Left;  . APPLICATION OF WOUND VAC Right 09/12/2016   Procedure: APPLICATION OF WOUND VAC ABOVE KNEE;  Surgeon: Newt Minion, MD;  Location: [facility];  Service: Orthopedics;  Laterality: Right;  . LOWER EXTREMITY ANGIOGRAPHY Bilateral 08/11/2016   Procedure: Lower Extremity Angiography;  Surgeon: Waynetta Sandy, MD;  Location: [facility];  Service: Cardiovascular;  Laterality: Bilateral;  . PERIPHERAL VASCULAR BALLOON ANGIOPLASTY Left 08/11/2016   Procedure: Peripheral Vascular Balloon Angioplasty;  Surgeon: Waynetta Sandy, MD;  Location: [facility];  Service: Cardiovascular;  Laterality: Left;  SFA  . THYROID SURGERY     Social History   Occupational History  . Occupation: drives a Forensic scientist  Tobacco Use  . Smoking status: Current Every Day Smoker    Packs/day: 0.75    Years: 45.00    Pack years: 33.75     Types: Cigarettes    Last attempt to quit: 11/12/2014    Years since quitting: 3.1  . Smokeless tobacco: Never Used  Substance and Sexual Activity  . Alcohol use: No    Alcohol/week: 0.0 oz    Comment: h/o heavy use; stopped drinking in 7/16  . Drug use: No  . Sexual activity: Not Currently

## 2018-01-24 ENCOUNTER — Ambulatory Visit: Payer: Self-pay | Admitting: Family Medicine

## 2018-02-02 ENCOUNTER — Telehealth: Payer: Self-pay | Admitting: Family Medicine

## 2018-02-02 ENCOUNTER — Encounter: Payer: Self-pay | Admitting: Family Medicine

## 2018-02-02 ENCOUNTER — Ambulatory Visit (INDEPENDENT_AMBULATORY_CARE_PROVIDER_SITE_OTHER): Payer: Medicaid Other | Admitting: Family Medicine

## 2018-02-02 VITALS — BP 118/66 | HR 79 | Temp 97.8°F | Ht 68.0 in

## 2018-02-02 DIAGNOSIS — I10 Essential (primary) hypertension: Secondary | ICD-10-CM | POA: Diagnosis not present

## 2018-02-02 DIAGNOSIS — E039 Hypothyroidism, unspecified: Secondary | ICD-10-CM

## 2018-02-02 DIAGNOSIS — E1142 Type 2 diabetes mellitus with diabetic polyneuropathy: Secondary | ICD-10-CM

## 2018-02-02 DIAGNOSIS — E041 Nontoxic single thyroid nodule: Secondary | ICD-10-CM | POA: Diagnosis not present

## 2018-02-02 LAB — BAYER DCA HB A1C WAIVED: HB A1C (BAYER DCA - WAIVED): >14 % — ABNORMAL HIGH (ref <7.0)

## 2018-02-02 MED ORDER — LEVOTHYROXINE SODIUM 75 MCG PO TABS: 75.0000 ug | ORAL_TABLET | Freq: Every day | ORAL | 1 refills | Status: DC

## 2018-02-02 NOTE — Telephone Encounter (Signed)
Calling to confirm medications.  Lost call, tried to call back several times and could not get her.

## 2018-02-02 NOTE — Telephone Encounter (Signed)
[name] called back, she is concerned because she does not feel he is taking all of the medications that were on his AWV this afternoon.  I advised her to go to his home to see what medications he has and is taking, compare them to the list from today, and call us to let us know what these are  So we may see if we need to send in prescriptions for any he may be missing.

## 2018-02-02 NOTE — Progress Notes (Signed)
Subjective:  Patient ID: Todd [name] Reasons,  male    DOB: Jul 18, 1955  Age: 62 y.o.    CC: Medical Management of Chronic Issues   HPI Dryden Tapley presents for  follow-up of hypertension. Patient has no history of headache chest pain or shortness of breath or recent cough. Patient also denies symptoms of TIA such as numbness weakness lateralizing. Patient denies side effects from medication. States taking it regularly.  Patient also  in for follow-up of elevated cholesterol. Doing well without complaints on current medication. Denies side effects  including myalgia and arthralgia and nausea. Also in today for liver function testing. Currently no chest pain, shortness of breath or other cardiovascular related symptoms noted.  Follow-up of diabetes. Patient does check blood sugar at home  occasionally Patient denies symptoms such as excessive hunger or urinary frequency, excessive hunger, nausea No significant hypoglycemic spells noted. Medications reviewed. Pt reports taking n'tthem regularly. Pt. denies complication/adverse reaction today.    History Gregory has a past medical history of AKI (acute kidney injury) ([place]), Constipated, Diabetes mellitus without complication ([place]), Diarrhea, Elevated LFTs, Goiter, Gout, Hyperlipidemia, Hypertension, Leukocytosis, Reactive thrombocytosis, Right BKA infection ([place]) (08/2016), Right leg pain, Sepsis due to undetermined organism [facility]), Thyroid disease, and Wound infection after surgery (08/2016).   He has a past surgical history that includes Thyroid surgery; Lower Extremity Angiography (Bilateral, 08/11/2016); ABDOMINAL AORTOGRAM (N/A, 08/11/2016); PERIPHERAL VASCULAR BALLOON ANGIOPLASTY (Left, 08/11/2016); ABDOMINAL AORTOGRAM W/LOWER EXTREMITY (N/A, 08/15/2016); Amputation (Right, 08/17/2016); Amputation (Right, 09/12/2016); Application if wound vac (Right, 09/12/2016); Amputation (Left, 08/12/2016); and Amputation (Left, 11/01/2017).   His family history  includes Diabetes in his maternal aunt and maternal uncle; Heart disease in his mother; Pneumonia in his father.He reports that he has been smoking cigarettes. He has a 33.75 pack-year smoking history. He has never used smokeless tobacco. He reports that he drinks alcohol. He reports that he does not use drugs.  No current facility-administered medications on file prior to visit.    Current Outpatient Medications on File Prior to Visit  Medication Sig Dispense Refill  . Insulin Glargine (LANTUS SOLOSTAR) 100 UNIT/ML Solostar Pen Inject 10 Units into the skin daily. (Patient not taking: Reported on 02/03/2018) 15 mL 11  . allopurinol (ZYLOPRIM) 100 MG tablet Take 1 tablet (100 mg total) by mouth daily. (Patient not taking: Reported on 02/02/2018) 30 tablet 6  . aspirin (ASPIRIN 81) 81 MG EC tablet Take 1 tablet by mouth daily.     [name] atorvastatin (LIPITOR) 40 MG tablet Take 1 tablet (40 mg total) by mouth daily at 6 PM. (Patient not taking: Reported on 02/02/2018)    . carvedilol (COREG) 3.125 MG tablet Take 1 tablet (3.125 mg total) by mouth 2 (two) times daily with a meal. (Patient not taking: Reported on 02/02/2018) 60 tablet 2  . docusate sodium (COLACE) 100 MG capsule Take 1 capsule (100 mg total) by mouth 2 (two) times daily. (Patient not taking: Reported on 02/02/2018) 10 capsule 0  . feeding supplement, GLUCERNA SHAKE, (GLUCERNA SHAKE) LIQD Take 237 mLs by mouth 2 (two) times daily between meals. (Patient not taking: Reported on 02/02/2018) 30 Can 0  . megestrol (MEGACE) 400 MG/10ML suspension Take 10 mLs (400 mg total) by mouth 2 (two) times daily. For appetite stimulation (Patient not taking: Reported on 02/02/2018) 600 mL 2  . polyethylene glycol (MIRALAX / GLYCOLAX) packet Take 17 g by mouth daily as needed for mild constipation. (Patient not taking: Reported on 02/02/2018) 14 each 0  .  protein supplement shake (PREMIER PROTEIN) LIQD Take 325 mLs (11 oz total) by mouth daily with breakfast. (Patient  not taking: Reported on 02/02/2018) 30 Can 0    ROS Review of Systems  Constitutional: Negative.   HENT: Negative.   Eyes: Negative for visual disturbance.  Respiratory: Negative for cough and shortness of breath.   Cardiovascular: Negative for chest pain and leg swelling.  Gastrointestinal: Negative for abdominal pain, diarrhea, nausea and vomiting.  Genitourinary: Negative for difficulty urinating.  Musculoskeletal: Negative for arthralgias and myalgias.  Skin: Negative for rash.  Neurological: Negative for headaches.  Psychiatric/Behavioral: Negative for sleep disturbance.    Objective:  BP 118/66   Pulse 79   Temp 97.8 F (36.6 C) (Oral)   Ht 5' 8"  (1.727 m)   BMI 22.05 kg/m   BP Readings from Last 3 Encounters:  02/04/18 (!) 146/73  02/02/18 118/66  11/03/17 103/67    Wt Readings from Last 3 Encounters:  02/04/18 133 lb 2.5 oz (60.4 kg)  11/30/17 145 lb (65.8 kg)  11/02/17 145 lb 4.5 oz (65.9 kg)     Physical Exam  Constitutional: He is oriented to person, place, and time. He appears well-developed and well-nourished. No distress.  HENT:  Head: Normocephalic and atraumatic.  Right Ear: External ear normal.  Left Ear: External ear normal.  Nose: Nose normal.  Mouth/Throat: Oropharynx is clear and moist.  Eyes: Pupils are equal, round, and reactive to light. Conjunctivae and EOM are normal.  Neck: Normal range of motion. Neck supple.  Cardiovascular: Normal rate, regular rhythm and normal heart sounds.  No murmur heard. Pulmonary/Chest: Effort normal and breath sounds normal. No respiratory distress. He has no wheezes. He has no rales.  Abdominal: Soft. There is no tenderness.  Musculoskeletal: Normal range of motion.  Neurological: He is alert and oriented to person, place, and time. He has normal reflexes.  Skin: Skin is warm and dry.  Psychiatric: He has a normal mood and affect. His behavior is normal. Judgment and thought content normal.    Diabetic  Foot Exam - Simple   Simple Foot Form Diabetic Foot exam was performed with the following findings:  Yes 02/02/2018 11:00 AM  Visual Inspection Sensation Testing Pulse Check Comments Bilateral amputee        Assessment & Plan:   Alexiz was seen today for medical management of chronic issues.  Diagnoses and all orders for this visit:  Diabetic polyneuropathy associated with type 2 diabetes mellitus ([place]) -     CBC with Differential/Platelet -     CMP14+EGFR -     Bayer DCA Hb A1c Waived  Essential hypertension  Hypothyroidism, unspecified type -     TSH -     T4, Free  Thyroid nodule -     US THYROID; Future  Other orders -     levothyroxine (SYNTHROID, LEVOTHROID) 75 MCG tablet; Take 1 tablet (75 mcg total) by mouth daily before breakfast. (Patient not taking: Reported on 02/03/2018)   I am having [name] maintain his aspirin, Insulin Glargine, megestrol, atorvastatin, carvedilol, allopurinol, docusate sodium, feeding supplement ([place]), polyethylene glycol, protein supplement shake, and levothyroxine.  Meds ordered this encounter  Medications  . levothyroxine (SYNTHROID, LEVOTHROID) 75 MCG tablet    Sig: Take 1 tablet (75 mcg total) by mouth daily before breakfast.    Dispense:  90 tablet    Refill:  1    Please consider 90 day supplies to promote better adherence  Follow-up: Return in about 3 months (around 05/05/2018).  [name], M.D.

## 2018-02-03 ENCOUNTER — Other Ambulatory Visit: Payer: Self-pay

## 2018-02-03 ENCOUNTER — Encounter (HOSPITAL_COMMUNITY): Payer: Self-pay | Admitting: Emergency Medicine

## 2018-02-03 ENCOUNTER — Inpatient Hospital Stay (HOSPITAL_COMMUNITY)
Admission: EM | Admit: 2018-02-03 | Discharge: 2018-02-05 | DRG: 638 | Disposition: A | Discharge status: Home / Self Care | Payer: Medicaid Other | Attending: Family Medicine | Admitting: Family Medicine

## 2018-02-03 DIAGNOSIS — B369 Superficial mycosis, unspecified: Secondary | ICD-10-CM | POA: Diagnosis present

## 2018-02-03 DIAGNOSIS — Z89612 Acquired absence of left leg above knee: Secondary | ICD-10-CM | POA: Diagnosis not present

## 2018-02-03 DIAGNOSIS — E1342 Other specified diabetes mellitus with diabetic polyneuropathy: Secondary | ICD-10-CM | POA: Diagnosis present

## 2018-02-03 DIAGNOSIS — S78112A Complete traumatic amputation at level between left hip and knee, initial encounter: Secondary | ICD-10-CM

## 2018-02-03 DIAGNOSIS — IMO0002 Reserved for concepts with insufficient information to code with codable children: Secondary | ICD-10-CM | POA: Diagnosis present

## 2018-02-03 DIAGNOSIS — D75838 Other thrombocytosis: Secondary | ICD-10-CM | POA: Diagnosis present

## 2018-02-03 DIAGNOSIS — E1110 Type 2 diabetes mellitus with ketoacidosis without coma: Secondary | ICD-10-CM | POA: Diagnosis present

## 2018-02-03 DIAGNOSIS — E785 Hyperlipidemia, unspecified: Secondary | ICD-10-CM | POA: Diagnosis present

## 2018-02-03 DIAGNOSIS — I129 Hypertensive chronic kidney disease with stage 1 through stage 4 chronic kidney disease, or unspecified chronic kidney disease: Secondary | ICD-10-CM | POA: Diagnosis present

## 2018-02-03 DIAGNOSIS — F10920 Alcohol use, unspecified with intoxication, uncomplicated: Secondary | ICD-10-CM

## 2018-02-03 DIAGNOSIS — F10129 Alcohol abuse with intoxication, unspecified: Secondary | ICD-10-CM | POA: Diagnosis present

## 2018-02-03 DIAGNOSIS — I10 Essential (primary) hypertension: Secondary | ICD-10-CM | POA: Diagnosis not present

## 2018-02-03 DIAGNOSIS — E1010 Type 1 diabetes mellitus with ketoacidosis without coma: Secondary | ICD-10-CM

## 2018-02-03 DIAGNOSIS — E1142 Type 2 diabetes mellitus with diabetic polyneuropathy: Secondary | ICD-10-CM | POA: Diagnosis not present

## 2018-02-03 DIAGNOSIS — E1122 Type 2 diabetes mellitus with diabetic chronic kidney disease: Secondary | ICD-10-CM | POA: Diagnosis present

## 2018-02-03 DIAGNOSIS — R32 Unspecified urinary incontinence: Secondary | ICD-10-CM | POA: Diagnosis present

## 2018-02-03 DIAGNOSIS — N184 Chronic kidney disease, stage 4 (severe): Secondary | ICD-10-CM | POA: Diagnosis present

## 2018-02-03 DIAGNOSIS — E1310 Other specified diabetes mellitus with ketoacidosis without coma: Principal | ICD-10-CM | POA: Diagnosis present

## 2018-02-03 DIAGNOSIS — Z79899 Other long term (current) drug therapy: Secondary | ICD-10-CM | POA: Diagnosis not present

## 2018-02-03 DIAGNOSIS — R7989 Other specified abnormal findings of blood chemistry: Secondary | ICD-10-CM | POA: Diagnosis not present

## 2018-02-03 DIAGNOSIS — K5900 Constipation, unspecified: Secondary | ICD-10-CM | POA: Diagnosis present

## 2018-02-03 DIAGNOSIS — F17200 Nicotine dependence, unspecified, uncomplicated: Secondary | ICD-10-CM | POA: Diagnosis present

## 2018-02-03 DIAGNOSIS — F17210 Nicotine dependence, cigarettes, uncomplicated: Secondary | ICD-10-CM | POA: Diagnosis present

## 2018-02-03 DIAGNOSIS — Z794 Long term (current) use of insulin: Secondary | ICD-10-CM | POA: Diagnosis not present

## 2018-02-03 DIAGNOSIS — N183 Chronic kidney disease, stage 3 (moderate): Secondary | ICD-10-CM | POA: Diagnosis present

## 2018-02-03 DIAGNOSIS — N179 Acute kidney failure, unspecified: Secondary | ICD-10-CM | POA: Diagnosis present

## 2018-02-03 DIAGNOSIS — E1165 Type 2 diabetes mellitus with hyperglycemia: Secondary | ICD-10-CM | POA: Diagnosis not present

## 2018-02-03 DIAGNOSIS — L304 Erythema intertrigo: Secondary | ICD-10-CM | POA: Diagnosis present

## 2018-02-03 DIAGNOSIS — E039 Hypothyroidism, unspecified: Secondary | ICD-10-CM | POA: Diagnosis present

## 2018-02-03 DIAGNOSIS — F10929 Alcohol use, unspecified with intoxication, unspecified: Secondary | ICD-10-CM | POA: Diagnosis present

## 2018-02-03 DIAGNOSIS — E1322 Other specified diabetes mellitus with diabetic chronic kidney disease: Secondary | ICD-10-CM | POA: Diagnosis present

## 2018-02-03 DIAGNOSIS — Z7982 Long term (current) use of aspirin: Secondary | ICD-10-CM | POA: Diagnosis not present

## 2018-02-03 DIAGNOSIS — Z720 Tobacco use: Secondary | ICD-10-CM | POA: Diagnosis not present

## 2018-02-03 DIAGNOSIS — E876 Hypokalemia: Secondary | ICD-10-CM | POA: Diagnosis present

## 2018-02-03 DIAGNOSIS — N3942 Incontinence without sensory awareness: Secondary | ICD-10-CM | POA: Diagnosis not present

## 2018-02-03 DIAGNOSIS — Z89611 Acquired absence of right leg above knee: Secondary | ICD-10-CM

## 2018-02-03 DIAGNOSIS — E1351 Other specified diabetes mellitus with diabetic peripheral angiopathy without gangrene: Secondary | ICD-10-CM | POA: Diagnosis present

## 2018-02-03 DIAGNOSIS — E790 Hyperuricemia without signs of inflammatory arthritis and tophaceous disease: Secondary | ICD-10-CM | POA: Diagnosis present

## 2018-02-03 DIAGNOSIS — D75839 Thrombocytosis, unspecified: Secondary | ICD-10-CM | POA: Diagnosis present

## 2018-02-03 DIAGNOSIS — D72829 Elevated white blood cell count, unspecified: Secondary | ICD-10-CM | POA: Diagnosis present

## 2018-02-03 DIAGNOSIS — R739 Hyperglycemia, unspecified: Secondary | ICD-10-CM | POA: Diagnosis not present

## 2018-02-03 LAB — CMP14+EGFR
ALT: 14 IU/L (ref 0–44)
AST: 15 IU/L (ref 0–40)
Albumin/Globulin Ratio: 0.7 — ABNORMAL LOW (ref 1.2–2.2)
Albumin: 3.7 g/dL (ref 3.6–4.8)
Alkaline Phosphatase: 156 IU/L — ABNORMAL HIGH (ref 39–117)
BILIRUBIN TOTAL: 0.4 mg/dL (ref 0.0–1.2)
BUN/Creatinine Ratio: 10 (ref 10–24)
BUN: 20 mg/dL (ref 8–27)
CHLORIDE: 78 mmol/L — AB (ref 96–106)
CO2: 16 mmol/L — ABNORMAL LOW (ref 20–29)
Calcium: 8.9 mg/dL (ref 8.6–10.2)
Creatinine, Ser: 2.02 mg/dL — ABNORMAL HIGH (ref 0.76–1.27)
GFR calc Af Amer: 40 mL/min/{1.73_m2} — ABNORMAL LOW (ref >59)
GFR calc non Af Amer: 34 mL/min/{1.73_m2} — ABNORMAL LOW (ref >59)
GLUCOSE: 1157 mg/dL — AB (ref 65–99)
Globulin, Total: 5 g/dL — ABNORMAL HIGH (ref 1.5–4.5)
Potassium: 4.7 mmol/L (ref 3.5–5.2)
Sodium: 124 mmol/L — ABNORMAL LOW (ref 134–144)
TOTAL PROTEIN: 8.7 g/dL — AB (ref 6.0–8.5)

## 2018-02-03 LAB — BASIC METABOLIC PANEL
ANION GAP: 18 — AB (ref 5–15)
Anion gap: 8 (ref 5–15)
BUN: 21 mg/dL (ref 8–23)
BUN: 17 mg/dL (ref 8–23)
CALCIUM: 7.9 mg/dL — AB (ref 8.9–10.3)
CO2: 17 mmol/L — AB (ref 22–32)
CO2: 22 mmol/L (ref 22–32)
CREATININE: 1.85 mg/dL — AB (ref 0.61–1.24)
CREATININE: 1.29 mg/dL — AB (ref 0.61–1.24)
Calcium: 7.7 mg/dL — ABNORMAL LOW (ref 8.9–10.3)
Chloride: 99 mmol/L (ref 98–111)
Chloride: 112 mmol/L — ABNORMAL HIGH (ref 98–111)
GFR calc Af Amer: 43 mL/min — ABNORMAL LOW (ref >60)
GFR calc Af Amer: >60 mL/min (ref >60)
GFR, EST NON AFRICAN AMERICAN: 37 mL/min — AB (ref >60)
GFR, EST NON AFRICAN AMERICAN: 58 mL/min — AB (ref >60)
GLUCOSE: 672 mg/dL — AB (ref 70–99)
GLUCOSE: 110 mg/dL — AB (ref 70–99)
POTASSIUM: 2.9 mmol/L — AB (ref 3.5–5.1)
Potassium: 3.2 mmol/L — ABNORMAL LOW (ref 3.5–5.1)
SODIUM: 142 mmol/L (ref 135–145)
Sodium: 134 mmol/L — ABNORMAL LOW (ref 135–145)

## 2018-02-03 LAB — CBG MONITORING, ED: Glucose-Capillary: >600 mg/dL (ref 70–99)

## 2018-02-03 LAB — COMPREHENSIVE METABOLIC PANEL
ALT: 17 U/L (ref 0–44)
AST: 14 U/L — AB (ref 15–41)
Albumin: 2.8 g/dL — ABNORMAL LOW (ref 3.5–5.0)
Alkaline Phosphatase: 120 U/L (ref 38–126)
Anion gap: 22 — ABNORMAL HIGH (ref 5–15)
BUN: 23 mg/dL (ref 8–23)
CHLORIDE: 89 mmol/L — AB (ref 98–111)
CO2: 17 mmol/L — AB (ref 22–32)
CREATININE: 2.06 mg/dL — AB (ref 0.61–1.24)
Calcium: 8 mg/dL — ABNORMAL LOW (ref 8.9–10.3)
GFR calc Af Amer: 38 mL/min — ABNORMAL LOW (ref >60)
GFR calc non Af Amer: 33 mL/min — ABNORMAL LOW (ref >60)
Glucose, Bld: 954 mg/dL (ref 70–99)
Potassium: 4 mmol/L (ref 3.5–5.1)
SODIUM: 128 mmol/L — AB (ref 135–145)
Total Bilirubin: 1.2 mg/dL (ref 0.3–1.2)
Total Protein: 7.8 g/dL (ref 6.5–8.1)

## 2018-02-03 LAB — CBC WITH DIFFERENTIAL/PLATELET
BASOS ABS: 0 10*3/uL (ref 0.0–0.2)
BASOS ABS: 0 10*3/uL (ref 0.0–0.1)
BASOS PCT: 0 %
Basos: 0 %
EOS (ABSOLUTE): 0.1 10*3/uL (ref 0.0–0.4)
EOS ABS: 0.1 10*3/uL (ref 0.0–0.7)
EOS PCT: 1 %
Eos: 1 %
HCT: 40.5 % (ref 39.0–52.0)
Hematocrit: 44.4 % (ref 37.5–51.0)
Hemoglobin: 13.6 g/dL (ref 13.0–17.7)
Hemoglobin: 13.3 g/dL (ref 13.0–17.0)
IMMATURE GRANS (ABS): 0 10*3/uL (ref 0.0–0.1)
IMMATURE GRANULOCYTES: 0 %
Lymphocytes Absolute: 1.3 10*3/uL (ref 0.7–3.1)
Lymphocytes Relative: 15 %
Lymphs Abs: 1.7 10*3/uL (ref 0.7–4.0)
Lymphs: 13 %
MCH: 29.8 pg (ref 26.6–33.0)
MCH: 30.3 pg (ref 26.0–34.0)
MCHC: 30.6 g/dL — ABNORMAL LOW (ref 31.5–35.7)
MCHC: 32.8 g/dL (ref 30.0–36.0)
MCV: 97 fL (ref 79–97)
MCV: 92.3 fL (ref 78.0–100.0)
Monocytes Absolute: 0.4 10*3/uL (ref 0.1–0.9)
Monocytes Absolute: 0.7 10*3/uL (ref 0.1–1.0)
Monocytes Relative: 7 %
Monocytes: 4 %
NEUTROS ABS: 8.1 10*3/uL — AB (ref 1.4–7.0)
NEUTROS PCT: 82 %
Neutro Abs: 8.5 10*3/uL — ABNORMAL HIGH (ref 1.7–7.7)
Neutrophils Relative %: 77 %
PLATELETS: 558 10*3/uL — AB (ref 150–450)
PLATELETS: 482 10*3/uL — AB (ref 150–400)
RBC: 4.56 x10E6/uL (ref 4.14–5.80)
RBC: 4.39 MIL/uL (ref 4.22–5.81)
RDW: 14.9 % (ref 12.3–15.4)
RDW: 14.3 % (ref 11.5–15.5)
WBC: 9.9 10*3/uL (ref 3.4–10.8)
WBC: 11 10*3/uL — ABNORMAL HIGH (ref 4.0–10.5)

## 2018-02-03 LAB — ETHANOL: ALCOHOL ETHYL (B): 12 mg/dL — AB (ref <10)

## 2018-02-03 LAB — T4, FREE: Free T4: 1.83 ng/dL — ABNORMAL HIGH (ref 0.82–1.77)

## 2018-02-03 LAB — URINALYSIS, ROUTINE W REFLEX MICROSCOPIC
BACTERIA UA: NONE SEEN
Bilirubin Urine: NEGATIVE
Glucose, UA: >=500 mg/dL — AB
Ketones, ur: 20 mg/dL — AB
Leukocytes, UA: NEGATIVE
NITRITE: NEGATIVE
PH: 6 (ref 5.0–8.0)
Protein, ur: NEGATIVE mg/dL
SPECIFIC GRAVITY, URINE: 1.023 (ref 1.005–1.030)

## 2018-02-03 LAB — GLUCOSE, CAPILLARY
GLUCOSE-CAPILLARY: 116 mg/dL — AB (ref 70–99)
GLUCOSE-CAPILLARY: 231 mg/dL — AB (ref 70–99)
GLUCOSE-CAPILLARY: 90 mg/dL (ref 70–99)
GLUCOSE-CAPILLARY: 200 mg/dL — AB (ref 70–99)
Glucose-Capillary: 526 mg/dL (ref 70–99)
Glucose-Capillary: 419 mg/dL — ABNORMAL HIGH (ref 70–99)
Glucose-Capillary: 274 mg/dL — ABNORMAL HIGH (ref 70–99)
Glucose-Capillary: 123 mg/dL — ABNORMAL HIGH (ref 70–99)
Glucose-Capillary: 194 mg/dL — ABNORMAL HIGH (ref 70–99)

## 2018-02-03 LAB — MRSA PCR SCREENING: MRSA BY PCR: NEGATIVE

## 2018-02-03 LAB — TSH: TSH: 0.418 u[IU]/mL — AB (ref 0.450–4.500)

## 2018-02-03 MED ORDER — SODIUM CHLORIDE 0.9 % IV SOLN: INTRAVENOUS | Status: DC

## 2018-02-03 MED ORDER — DEXTROSE-NACL 5-0.45 % IV SOLN
INTRAVENOUS | Status: DC
  Administered 2018-02-03: 1000 mL via INTRAVENOUS

## 2018-02-03 MED ORDER — INSULIN ASPART 100 UNIT/ML ~~LOC~~ SOLN
5.0000 [IU] | Freq: Three times a day (TID) | SUBCUTANEOUS | Status: DC
  Administered 2018-02-04: 5 [IU] via SUBCUTANEOUS

## 2018-02-03 MED ORDER — INSULIN GLARGINE 100 UNIT/ML ~~LOC~~ SOLN: 20.0000 [IU] | SUBCUTANEOUS | Status: DC

## 2018-02-03 MED ORDER — INSULIN ASPART 100 UNIT/ML ~~LOC~~ SOLN
0.0000 [IU] | Freq: Three times a day (TID) | SUBCUTANEOUS | Status: DC
  Administered 2018-02-04: 5 [IU] via SUBCUTANEOUS

## 2018-02-03 MED ORDER — SODIUM CHLORIDE 0.9 % IV BOLUS
1000.0000 mL | Freq: Once | INTRAVENOUS | Status: AC
  Administered 2018-02-03: 1000 mL via INTRAVENOUS

## 2018-02-03 MED ORDER — SODIUM CHLORIDE 0.9 % IV SOLN
INTRAVENOUS | Status: DC
  Administered 2018-02-03: 13:00:00 via INTRAVENOUS
  Administered 2018-02-03: 1000 mL via INTRAVENOUS

## 2018-02-03 MED ORDER — INSULIN REGULAR HUMAN 100 UNIT/ML IJ SOLN
INTRAMUSCULAR | Status: DC
  Administered 2018-02-03: 5.4 [IU]/h via INTRAVENOUS
  Filled 2018-02-03: qty 1

## 2018-02-03 MED ORDER — ENOXAPARIN SODIUM 40 MG/0.4ML ~~LOC~~ SOLN
40.0000 mg | SUBCUTANEOUS | Status: DC
  Administered 2018-02-03 – 2018-02-04 (×2): 40 mg via SUBCUTANEOUS
  Filled 2018-02-03 (×2): qty 0.4

## 2018-02-03 MED ORDER — INSULIN ASPART 100 UNIT/ML ~~LOC~~ SOLN: 0.0000 [IU] | Freq: Every day | SUBCUTANEOUS | Status: DC

## 2018-02-03 MED ORDER — POTASSIUM CHLORIDE 10 MEQ/100ML IV SOLN
10.0000 meq | INTRAVENOUS | Status: AC
  Administered 2018-02-03 (×4): 10 meq via INTRAVENOUS
  Filled 2018-02-03 (×3): qty 100

## 2018-02-03 MED ORDER — THIAMINE HCL 100 MG/ML IJ SOLN
100.0000 mg | Freq: Every day | INTRAMUSCULAR | Status: DC
  Administered 2018-02-03 – 2018-02-05 (×3): 100 mg via INTRAVENOUS
  Filled 2018-02-03 (×3): qty 2

## 2018-02-03 MED ORDER — LORAZEPAM 2 MG/ML IJ SOLN: 2.0000 mg | INTRAMUSCULAR | Status: DC | PRN

## 2018-02-03 MED ORDER — DEXTROSE-NACL 5-0.45 % IV SOLN: INTRAVENOUS | Status: DC

## 2018-02-03 MED ORDER — HYDRALAZINE HCL 20 MG/ML IJ SOLN: 10.0000 mg | INTRAMUSCULAR | Status: DC | PRN

## 2018-02-03 MED ORDER — FOLIC ACID 5 MG/ML IJ SOLN
1.0000 mg | Freq: Every day | INTRAMUSCULAR | Status: DC
  Administered 2018-02-03 – 2018-02-04 (×2): 1 mg via INTRAVENOUS
  Filled 2018-02-03 (×5): qty 0.2

## 2018-02-03 MED ORDER — POTASSIUM CHLORIDE 10 MEQ/100ML IV SOLN
10.0000 meq | INTRAVENOUS | Status: AC
  Administered 2018-02-03 (×2): 10 meq via INTRAVENOUS
  Filled 2018-02-03 (×2): qty 100

## 2018-02-03 MED ORDER — SODIUM CHLORIDE 0.9 % IV SOLN
INTRAVENOUS | Status: DC
  Administered 2018-02-03: 1000 mL via INTRAVENOUS

## 2018-02-03 MED ORDER — INSULIN GLARGINE 100 UNIT/ML ~~LOC~~ SOLN
18.0000 [IU] | SUBCUTANEOUS | Status: DC
  Administered 2018-02-03 – 2018-02-04 (×2): 18 [IU] via SUBCUTANEOUS
  Filled 2018-02-03 (×2): qty 0.18

## 2018-02-03 MED ORDER — POTASSIUM CHLORIDE IN NACL 20-0.9 MEQ/L-% IV SOLN
INTRAVENOUS | Status: DC
  Administered 2018-02-03 – 2018-02-05 (×3): via INTRAVENOUS

## 2018-02-03 NOTE — ED Provider Notes (Signed)
Patient is a 62 year old male, he has a known history of diabetes, the patient is a very poor historian but states that he was brought in by paramedics, states his labs were done yesterday at the doctor's office and was reportedly severely hyperglycemic over 1000.  Given fluids prehospital, on my exam has a nontender abdomen, clear heart and lung sounds, slightly slurring his words.  Bilateral above-the-knee amputations, no tenderness over the stumps, no oropharyngeal redness or purulence.  Will check labs to rule out DKA, he may have some element of severe hyperglycemia would benefit from fluids insulin drip and further evaluation.  Consider inpatient admission based on response to medications and fluids.  Records the patient had 1 of his above ED amputations over a year ago, the second 1 was done several months ago, he is followed by Dr. Sharol Given for that.  Primary care physician is Dr. [name] at [facility]  Labs reviewed showing severe hyperglycemia, low CO2 level, increased anion gap at 22 all consistent with diabetic ketoacidosis.  The patient will need to be admitted to the hospital, started on insulin drip.  Critical care provided.  Discussed care with Dr. Wynetta Emery of the hospitalist service who will admit  .Critical Care Performed by: Noemi Chapel, MD Authorized by: Noemi Chapel, MD   Critical care provider statement:    Critical care time (minutes):  45   Critical care was necessary to treat or prevent imminent or life-threatening deterioration of the following conditions:  Endocrine crisis   Critical care was time spent personally by me on the following activities:  Discussions with consultants, evaluation of patient's response to treatment, examination of patient, ordering and performing treatments and interventions, ordering and review of laboratory studies, ordering and review of radiographic studies, pulse oximetry, re-evaluation of patient's condition,  obtaining history from patient or surrogate and review of old charts     Medical screening examination/treatment/procedure(s) were conducted as a shared visit with non-physician practitioner(s) and myself.  I personally evaluated the patient during the encounter.  Clinical Impression:   Final diagnoses:  Type 1 diabetes mellitus with ketoacidosis without coma ([place])  AKI (acute kidney injury) ([place])         Noemi Chapel, MD 02/03/18 1215

## 2018-02-03 NOTE — H&P (Signed)
History and Physical  Todd [name] NOM:[id number] DOB: [birth year] DOA: 02/03/2018  Referring physician: Sabra Heck  PCP: [name], MD   Chief Complaint: abnormal labs  Historian: Patient is intoxicated with alcohol and unable to fully participate.  Information taken from ED records and PCP records.  HPI: Todd [name] is a 62 y.o. male with known diabetes mellitus of uncertain type, had been insulin requiring, hypertension, hyperlipidemia, peripheral vascular disease and neuropathy status post bilateral AKA and hypothyroidism presented to the emergency room by EMS after being notified by his primary care physician that he needed to come to the ED because his blood glucose was greater than 1000.  He was seen by his PCP yesterday and had labs done.  His hemoglobin A1c is greater than 14%.  The patient says that he had stopped taking all of his medications because he felt better.  He was also noted to be intoxicated with alcohol.  He is reporting today that he had been started on treatment for diabetes approximately 2 years ago.  According to chart review his hemoglobin A1c had been 5.5% up until it was tested yesterday and found to be greater than 14%.  The patient denies any other recreational drug use.  He denies cough, chest pain, shortness of breath, fever and chills.  He otherwise had not been having many complaints.  There is some notation in the chart that there was concern that he was not taking his home medications and his PCP was investigating this.  ED course: The patient was noted to be intoxicated with alcohol.  His serum alcohol level was elevated.  His blood glucose is greater than 900.  His anion gap was 22.  He had an elevated white blood cell count.  He was started on IV fluids and IV insulin and hospitalization was requested.  Review of Systems: Unable to obtain due to patient's current intoxicated state.  Past Medical History:  Diagnosis Date  . AKI (acute kidney  injury) ([place])   . Constipated   . Diabetes mellitus without complication ([place])   . Diarrhea   . Elevated LFTs   . Goiter   . Gout   . Hyperlipidemia   . Hypertension   . Leukocytosis   . Reactive thrombocytosis   . Right BKA infection ([place]) 08/2016  . Right leg pain   . Sepsis due to undetermined organism ([place])   . Thyroid disease   . Wound infection after surgery 08/2016   Past Surgical History:  Procedure Laterality Date  . ABDOMINAL AORTOGRAM N/A 08/11/2016   Procedure: Abdominal Aortogram;  Surgeon: Waynetta Sandy, MD;  Location: [facility];  Service: Cardiovascular;  Laterality: N/A;  . ABDOMINAL AORTOGRAM W/LOWER EXTREMITY N/A 08/15/2016   Procedure: Abdominal Aortogram w/Lower Extremity;  Surgeon: Elam Dutch, MD;  Location: [facility];  Service: Cardiovascular;  Laterality: N/A;  . AMPUTATION Right 08/17/2016   Procedure: RIGHT BELOW KNEE AMPUTATION;  Surgeon: Elam Dutch, MD;  Location: [facility];  Service: Vascular;  Laterality: Right;  . AMPUTATION Right 09/12/2016   Procedure: AMPUTATION ABOVE KNEE;  Surgeon: Newt Minion, MD;  Location: [facility];  Service: Orthopedics;  Laterality: Right;  . AMPUTATION Left 08/12/2016   Procedure: LEFT BELOW KNEE AMPUTATION;  Surgeon: Newt Minion, MD;  Location: [facility];  Service: Orthopedics;  Laterality: Left;  . AMPUTATION Left 11/01/2017   Procedure: LEFT ABOVE KNEE AMPUTATION;  Surgeon: Newt Minion, MD;  Location: [facility];  Service: Orthopedics;  Laterality: Left;  . APPLICATION OF WOUND VAC Right 09/12/2016   Procedure: APPLICATION OF WOUND VAC ABOVE KNEE;  Surgeon: Newt Minion, MD;  Location: [facility];  Service: Orthopedics;  Laterality: Right;  . LOWER EXTREMITY ANGIOGRAPHY Bilateral 08/11/2016   Procedure: Lower Extremity Angiography;  Surgeon: Waynetta Sandy, MD;  Location: [facility];  Service: Cardiovascular;  Laterality: Bilateral;  . PERIPHERAL VASCULAR BALLOON ANGIOPLASTY Left 08/11/2016     Procedure: Peripheral Vascular Balloon Angioplasty;  Surgeon: Waynetta Sandy, MD;  Location: [facility];  Service: Cardiovascular;  Laterality: Left;  SFA  . THYROID SURGERY     Social History:  reports that he has been smoking cigarettes. He has a 33.75 pack-year smoking history. He has never used smokeless tobacco. He reports that he drinks alcohol. He reports that he does not use drugs.  Allergies  Allergen Reactions  . Lisinopril Other (See Comments)    Hyperkalemia / Renal failure    Family History  Problem Relation Age of Onset  . Heart disease Mother   . Pneumonia Father   . Diabetes Maternal Aunt   . Diabetes Maternal Uncle     Prior to Admission medications   Medication Sig Start Date End Date Taking? Authorizing Provider  allopurinol (ZYLOPRIM) 100 MG tablet Take 1 tablet (100 mg total) by mouth daily. Patient not taking: Reported on 02/02/2018 11/03/17   Niel Hummer A, MD  aspirin (ASPIRIN 81) 81 MG EC tablet Take 1 tablet by mouth daily.     [provider]  atorvastatin (LIPITOR) 40 MG tablet Take 1 tablet (40 mg total) by mouth daily at 6 PM. Patient not taking: Reported on 02/02/2018 10/24/17   [name], MD  carvedilol (COREG) 3.125 MG tablet Take 1 tablet (3.125 mg total) by mouth 2 (two) times daily with a meal. Patient not taking: Reported on 02/02/2018 10/24/17   [name], MD  docusate sodium (COLACE) 100 MG capsule Take 1 capsule (100 mg total) by mouth 2 (two) times daily. Patient not taking: Reported on 02/02/2018 11/03/17   Regalado, Jerald Kief A, MD  feeding supplement, GLUCERNA SHAKE, (GLUCERNA SHAKE) LIQD Take 237 mLs by mouth 2 (two) times daily between meals. Patient not taking: Reported on 02/02/2018 11/03/17   Niel Hummer A, MD  Insulin Glargine (LANTUS SOLOSTAR) 100 UNIT/ML Solostar Pen Inject 10 Units into the skin daily. Patient not taking: Reported on 02/03/2018 08/15/17   [name], MD  levothyroxine  (SYNTHROID, LEVOTHROID) 75 MCG tablet Take 1 tablet (75 mcg total) by mouth daily before breakfast. Patient not taking: Reported on 02/03/2018 02/02/18   [name], MD  megestrol (MEGACE) 400 MG/10ML suspension Take 10 mLs (400 mg total) by mouth 2 (two) times daily. For appetite stimulation Patient not taking: Reported on 02/02/2018 08/15/17   [name], MD  polyethylene glycol [facility] Floria Raveling) packet Take 17 g by mouth daily as needed for mild constipation. Patient not taking: Reported on 02/02/2018 11/03/17   Regalado, Jerald Kief A, MD  protein supplement shake (PREMIER PROTEIN) LIQD Take 325 mLs (11 oz total) by mouth daily with breakfast. Patient not taking: Reported on 02/02/2018 11/03/17   Elmarie Shiley, MD   Physical Exam: Vitals:   02/03/18 1009 02/03/18 1030 02/03/18 1100 02/03/18 1115  BP: (!) 158/75 140/70 (!) 163/87   Pulse: 71 73 69 75  Resp: 18  15 18   Temp: (!) 97.5 F (36.4 C)     TempSrc: Oral  SpO2: 100% 98% 100% 100%  Weight: 79.4 kg     Height: 5\' 7"  (1.702 m)        General exam: Patient is intoxicated from alcohol.  Emaciated, chronically ill-appearing, lying comfortably supine on the gurney in no obvious distress.  Head, eyes and ENT: Nontraumatic and normocephalic. Pupils equally reacting to light and accommodation. Oral mucosa dry.  Neck: Supple. No JVD, carotid bruit or thyromegaly.  Lymphatics: No lymphadenopathy.  Respiratory system: Clear to auscultation. No increased work of breathing.  Cardiovascular system: Normal S1 and S2 heard.  No JVD, murmurs, gallops, clicks or pedal edema.  Gastrointestinal system: Abdomen is nondistended, soft and nontender. Normal bowel sounds heard. No organomegaly or masses appreciated.  Central nervous system: Alert and oriented. No focal neurological deficits.  Extremities: Bilateral AKAs.  Well-healed wounds.  Skin: No rashes or acute findings.  Musculoskeletal system: Normal lower extremity  AKA's.  Psychiatry: Intoxicated from alcohol.  Labs on Admission:  Basic Metabolic Panel: Recent Labs  Lab 02/02/18 1149 02/03/18 1024  NA 124* 128*  K 4.7 4.0  CL 78* 89*  CO2 16* 17*  GLUCOSE 1,157* 954*  BUN 20 23  CREATININE 2.02* 2.06*  CALCIUM 8.9 8.0*   Liver Function Tests: Recent Labs  Lab 02/02/18 1149 02/03/18 1024  AST 15 14*  ALT 14 17  ALKPHOS 156* 120  BILITOT 0.4 1.2  PROT 8.7* 7.8  ALBUMIN 3.7 2.8*   No results for input(s): LIPASE, AMYLASE in the last 168 hours. No results for input(s): AMMONIA in the last 168 hours. CBC: Recent Labs  Lab 02/02/18 1149 02/03/18 1024  WBC 9.9 11.0*  NEUTROABS 8.1* 8.5*  HGB 13.6 13.3  HCT 44.4 40.5  MCV 97 92.3  PLT 558* 482*   Cardiac Enzymes: No results for input(s): CKTOTAL, CKMB, CKMBINDEX, TROPONINI in the last 168 hours.  BNP (last 3 results) No results for input(s): PROBNP in the last 8760 hours. CBG: Recent Labs  Lab 02/03/18 1014  GLUCAP >600*    Radiological Exams on Admission: No results found.  EKG: Independently reviewed.   Assessment/Plan Principal Problem:   DKA (diabetic ketoacidoses) (HCC) Active Problems:   HTN (hypertension)   Hypothyroidism   Diabetes type 2, uncontrolled (HCC)   Hyperuricemia   ARF (acute renal failure) (H[facility])   Diabetic polyneuropathy associated with type 2 diabetes mellitus ([place])   History of right above knee amputation ([place])   Urinary incontinence   Above knee amputation of left lower extremity (HCC)   Alcohol intoxication ([place])   Tobacco abuse   Hyperglycemia   Leukocytosis   Reactive thrombocytosis  1. Diabetic ketoacidosis- patient has an anion gap of 22 and ketones noted in the urine.  Continue IV insulin infusion DKA protocol, glucose stabilizer, IV fluid hydration, treat and monitor in the stepdown unit for close monitoring.  Cardiac monitoring, hourly blood glucose testing while on IV insulin, check BMP every 4 hours x 3, maintain n.p.o.  status while on IV insulin infusion.  Investigate for source of infection.  None has been found at this time. 2. Alcohol intoxication- unsure of how much he drinks on a regular basis, he has been placed on a CIWA protocol to monitor for alcohol withdrawal.  Supplemental vitamin therapy ordered.  Follow. 3. Leukocytosis-suspect this is secondary to acute DKA, no signs or symptoms of infection found, follow urinalysis and repeat chest x-ray in the morning after hydration.  No antibiotics ordered at this time. 4. Tobacco abuse- strongly advised to  discontinue all tobacco use.  The patient verbalizes understanding. 5. Reactive thrombocytosis- plan to repeat CBC in the morning after treatment. 6. Acute kidney injury on CKD stage III- repeat BMP after hydration, try to avoid nephrotoxins. 7. Hypothyroidism- would not check TSH in setting of acute critical illness, follow-up outpatient. 8. Essential hypertension- monitor blood pressure and provide IV medications as needed while n.p.o.  DVT Prophylaxis: Lovenox Code Status: Full Family Communication: Patient Disposition Plan: Home when medically stabilized  Critical Care Time spent: 50 minutes  Irwin Brakeman, MD [facility] Pager [phone]  If 7PM-7AM, please contact night-coverage [email or web address] Password [place] 02/03/2018, 11:49 AM

## 2018-02-03 NOTE — ED Provider Notes (Signed)
[facility] EMERGENCY DEPARTMENT Provider Note   CSN: [id number] Arrival date & time: 02/03/18  1005     History   Chief Complaint Chief Complaint  Patient presents with  . Hyperglycemia    HPI Todd [name] is a 62 y.o. male.  Patient with a history of DM, HTN, HLD, bilateral AKA, thyroid disease presents after being seen by primary care yesterday for routine visit and being contacted by his office with report of significantly high blood sugar, <1000. He denies pain, vomiting. He has had increased urination. He states he stopped taking his medications recently because "I felt better". He states he has been on diabetic medications for the past 2 years. Per chart review, A1c baseline is 5.5 until yesterday when it was found to be 14.   The history is provided by the patient and the EMS personnel. No language interpreter was used.    Past Medical History:  Diagnosis Date  . AKI (acute kidney injury) ([place])   . Constipated   . Diabetes mellitus without complication ([place])   . Diarrhea   . Elevated LFTs   . Goiter   . Gout   . Hyperlipidemia   . Hypertension   . Leukocytosis   . Reactive thrombocytosis   . Right BKA infection ([place]) 08/2016  . Right leg pain   . Sepsis due to undetermined organism ([place])   . Thyroid disease   . Wound infection after surgery 08/2016    Patient Active Problem List   Diagnosis Date Noted  . Above knee amputation of left lower extremity ([place]) 11/16/2017  . Dehiscence of amputation stump ([place])   . Cellulitis 10/25/2017  . Wound infection   . Urinary incontinence 08/14/2017  . Skin ulcer ([place]) 07/28/2017  . History of right above knee amputation ([place]) 09/21/2016  . Transaminitis   . Diabetic polyneuropathy associated with type 2 diabetes mellitus ([place]) 07/21/2016  . ARF (acute renal failure) ([place]) 07/04/2016  . Hyperuricemia 06/21/2016  . HTN (hypertension) 01/05/2016  . Diabetes type 2, uncontrolled ([place]) 01/05/2016  . Hypothyroidism  05/10/2011    Past Surgical History:  Procedure Laterality Date  . ABDOMINAL AORTOGRAM N/A 08/11/2016   Procedure: Abdominal Aortogram;  Surgeon: Waynetta Sandy, MD;  Location: [place] [facility];  Service: Cardiovascular;  Laterality: N/A;  . ABDOMINAL AORTOGRAM W/LOWER EXTREMITY N/A 08/15/2016   Procedure: Abdominal Aortogram w/Lower Extremity;  Surgeon: Elam Dutch, MD;  Location: [place] [facility];  Service: Cardiovascular;  Laterality: N/A;  . AMPUTATION Right 08/17/2016   Procedure: RIGHT BELOW KNEE AMPUTATION;  Surgeon: Elam Dutch, MD;  Location: [place];  Service: Vascular;  Laterality: Right;  . AMPUTATION Right 09/12/2016   Procedure: AMPUTATION ABOVE KNEE;  Surgeon: Newt Minion, MD;  Location: [place];  Service: Orthopedics;  Laterality: Right;  . AMPUTATION Left 08/12/2016   Procedure: LEFT BELOW KNEE AMPUTATION;  Surgeon: Newt Minion, MD;  Location: [place];  Service: Orthopedics;  Laterality: Left;  . AMPUTATION Left 11/01/2017   Procedure: LEFT ABOVE KNEE AMPUTATION;  Surgeon: Newt Minion, MD;  Location: [place];  Service: Orthopedics;  Laterality: Left;  . APPLICATION OF WOUND VAC Right 09/12/2016   Procedure: APPLICATION OF WOUND VAC ABOVE KNEE;  Surgeon: Newt Minion, MD;  Location: [place];  Service: Orthopedics;  Laterality: Right;  . LOWER EXTREMITY ANGIOGRAPHY Bilateral 08/11/2016   Procedure: Lower Extremity Angiography;  Surgeon: Waynetta Sandy, MD;  Location: [place] [facility];  Service: Cardiovascular;  Laterality: Bilateral;  . PERIPHERAL VASCULAR BALLOON ANGIOPLASTY Left 08/11/2016   Procedure: Peripheral Vascular Balloon Angioplasty;  Surgeon: Waynetta Sandy, MD;  Location: [place] [facility];  Service: Cardiovascular;  Laterality: Left;  SFA  . THYROID SURGERY          Home Medications    Prior to Admission medications   Medication Sig Start Date End Date Taking? Authorizing Provider  allopurinol (ZYLOPRIM) 100 MG tablet Take 1  tablet (100 mg total) by mouth daily. Patient not taking: Reported on 02/02/2018 11/03/17   Niel Hummer A, MD  aspirin (ASPIRIN 81) 81 MG EC tablet Take 1 tablet by mouth daily.     [provider]  atorvastatin (LIPITOR) 40 MG tablet Take 1 tablet (40 mg total) by mouth daily at 6 PM. Patient not taking: Reported on 02/02/2018 10/24/17   [name], MD  carvedilol (COREG) 3.125 MG tablet Take 1 tablet (3.125 mg total) by mouth 2 (two) times daily with a meal. Patient not taking: Reported on 02/02/2018 10/24/17   [name], MD  docusate sodium (COLACE) 100 MG capsule Take 1 capsule (100 mg total) by mouth 2 (two) times daily. Patient not taking: Reported on 02/02/2018 11/03/17   Regalado, Jerald Kief A, MD  feeding supplement, GLUCERNA SHAKE, (GLUCERNA SHAKE) LIQD Take 237 mLs by mouth 2 (two) times daily between meals. Patient not taking: Reported on 02/02/2018 11/03/17   Niel Hummer A, MD  Insulin Glargine (LANTUS SOLOSTAR) 100 UNIT/ML Solostar Pen Inject 10 Units into the skin daily. 08/15/17   [name], MD  levothyroxine (SYNTHROID, LEVOTHROID) 75 MCG tablet Take 1 tablet (75 mcg total) by mouth daily before breakfast. 02/02/18   [name], MD  megestrol (MEGACE) 400 MG/10ML suspension Take 10 mLs (400 mg total) by mouth 2 (two) times daily. For appetite stimulation Patient not taking: Reported on 02/02/2018 08/15/17   [name], MD  polyethylene glycol [facility] / Floria Raveling) packet Take 17 g by mouth daily as needed for mild constipation. Patient not taking: Reported on 02/02/2018 11/03/17   Regalado, Jerald Kief A, MD  protein supplement shake (PREMIER PROTEIN) LIQD Take 325 mLs (11 oz total) by mouth daily with breakfast. Patient not taking: Reported on 02/02/2018 11/03/17   Elmarie Shiley, MD    Family History Family History  Problem Relation Age of Onset  . Heart disease Mother   . Pneumonia Father   . Diabetes Maternal Aunt   . Diabetes Maternal Uncle      Social History Social History   Tobacco Use  . Smoking status: Current Every Day Smoker    Packs/day: 0.75    Years: 45.00    Pack years: 33.75    Types: Cigarettes    Last attempt to quit: 11/12/2014    Years since quitting: 3.2  . Smokeless tobacco: Never Used  Substance Use Topics  . Alcohol use: Yes    Alcohol/week: 0.0 standard drinks  . Drug use: No     Allergies   Lisinopril   Review of Systems Review of Systems  Constitutional: Negative for chills, diaphoresis and fever.  HENT: Negative.   Respiratory: Negative.  Negative for shortness of breath.   Cardiovascular: Negative.  Negative for chest pain.  Gastrointestinal: Negative.  Negative for abdominal pain, nausea and vomiting.  Endocrine: Positive for polyuria.  Genitourinary: Negative for dysuria.  Musculoskeletal: Negative.  Negative for myalgias.  Skin: Negative.   Neurological: Negative.      Physical Exam Updated Vital Signs BP [name])  158/75 (BP Location: Left Arm)   Pulse 71   Temp (!) 97.5 F (36.4 C) (Oral)   Resp 18   Ht 5\' 7"  (1.702 m)   Wt 79.4 kg   SpO2 100%   BMI 27.41 kg/m   Physical Exam  Constitutional: He is oriented to person, place, and time. He appears well-developed and well-nourished.  HENT:  Head: Normocephalic.  Neck: Normal range of motion. Neck supple.  Cardiovascular: Normal rate and regular rhythm.  Pulmonary/Chest: Effort normal and breath sounds normal. He has no wheezes. He has no rales. He exhibits no tenderness.  Abdominal: Soft. Bowel sounds are normal. There is no tenderness. There is no rebound and no guarding.  Musculoskeletal: Normal range of motion.  Bilateral AKA  Neurological: He is alert and oriented to person, place, and time.  Skin: Skin is warm and dry. No rash noted.  Psychiatric: He has a normal mood and affect.     ED Treatments / Results  Labs (all labs ordered are listed, but only abnormal results are displayed) Labs Reviewed  CBG  MONITORING, ED - Abnormal; Notable for the following components:      Result Value   Glucose-Capillary >600 (*)    All other components within normal limits  CBC WITH DIFFERENTIAL/PLATELET  COMPREHENSIVE METABOLIC PANEL  URINALYSIS, ROUTINE W REFLEX MICROSCOPIC  ETHANOL    EKG None  Radiology No results found.  Procedures Procedures (including critical care time) CRITICAL CARE Performed by: Dewaine Oats   Total critical care time: 30 minutes  Critical care time was exclusive of separately billable procedures and treating other patients.  Critical care was necessary to treat or prevent imminent or life-threatening deterioration.  Critical care was time spent personally by me on the following activities: development of treatment plan with patient and/or surrogate as well as nursing, discussions with consultants, evaluation of patient's response to treatment, examination of patient, obtaining history from patient or surrogate, ordering and performing treatments and interventions, ordering and review of laboratory studies, ordering and review of radiographic studies, pulse oximetry and re-evaluation of patient's condition.  Medications Ordered in ED Medications  dextrose 5 %-0.45 % sodium chloride infusion (has no administration in time range)  insulin regular (NOVOLIN R,HUMULIN R) 100 Units in sodium chloride 0.9 % 100 mL (1 Units/mL) infusion (has no administration in time range)  sodium chloride 0.9 % bolus 1,000 mL (1,000 mLs Intravenous New Bag/Given 02/03/18 1029)    And  sodium chloride 0.9 % bolus 1,000 mL (has no administration in time range)    And  0.9 %  sodium chloride infusion (has no administration in time range)     Initial Impression / Assessment and Plan / ED Course  I have reviewed the triage vital signs and the nursing notes.  Pertinent labs & imaging results that were available during my care of the patient were reviewed by me and considered in my  medical decision making (see chart for details).     Patient here with hyperglycemia found after appointment with PCP yesterday. He denies symptoms of N, V, pain. He reports urinary frequency.   VSS, no tachycardia. Mild hypothermia c/w hyperglycemia. IVF's, Glucostabilizer started.   Repeat labs today confirm DKA. He is remarkably stable but given his significant history of sequelae from DM - bilateral amputations - and noncompliance, will admit for further management.   Final Clinical Impressions(s) / ED Diagnoses   Final diagnoses:  None   1. Hyperglycemia 2. DKA 3. Medical noncomplicance  ED Discharge Orders    None       Dennie Bible 02/03/18 1143    Noemi Chapel, MD 02/03/18 1215

## 2018-02-03 NOTE — ED Triage Notes (Signed)
Pt brought in by ems after having labs done yesterday at pcp office and glucose being 1157 with HgbA1C greater than 14.  Pt admits to drinking alcohol last night.  Was given 1L of NS by ems.  States he quit taking meds because he felt better.

## 2018-02-03 NOTE — ED Notes (Signed)
Date and time results received: 02/03/18 11:05 AM  (use smartphrase ".now" to insert current time)  Test: Glucose Critical Value: 954  Name of Provider Notified: Nehemiah Settle PA  Orders Received? Or Actions Taken?: Orders Received - See Orders for details

## 2018-02-04 ENCOUNTER — Inpatient Hospital Stay (HOSPITAL_COMMUNITY): Payer: Medicaid Other

## 2018-02-04 ENCOUNTER — Encounter: Payer: Self-pay | Admitting: Family Medicine

## 2018-02-04 DIAGNOSIS — E1142 Type 2 diabetes mellitus with diabetic polyneuropathy: Secondary | ICD-10-CM

## 2018-02-04 DIAGNOSIS — R7989 Other specified abnormal findings of blood chemistry: Secondary | ICD-10-CM

## 2018-02-04 DIAGNOSIS — R739 Hyperglycemia, unspecified: Secondary | ICD-10-CM

## 2018-02-04 DIAGNOSIS — Z89611 Acquired absence of right leg above knee: Secondary | ICD-10-CM

## 2018-02-04 LAB — COMPREHENSIVE METABOLIC PANEL
ALT: 17 U/L (ref 0–44)
AST: 27 U/L (ref 15–41)
Albumin: 2.2 g/dL — ABNORMAL LOW (ref 3.5–5.0)
Alkaline Phosphatase: 76 U/L (ref 38–126)
Anion gap: 10 (ref 5–15)
BUN: 16 mg/dL (ref 8–23)
CHLORIDE: 109 mmol/L (ref 98–111)
CO2: 18 mmol/L — ABNORMAL LOW (ref 22–32)
CREATININE: 1.38 mg/dL — AB (ref 0.61–1.24)
Calcium: 7.6 mg/dL — ABNORMAL LOW (ref 8.9–10.3)
GFR, EST NON AFRICAN AMERICAN: 53 mL/min — AB (ref >60)
Glucose, Bld: 250 mg/dL — ABNORMAL HIGH (ref 70–99)
POTASSIUM: 3.6 mmol/L (ref 3.5–5.1)
Sodium: 137 mmol/L (ref 135–145)
Total Bilirubin: 0.7 mg/dL (ref 0.3–1.2)
Total Protein: 6.4 g/dL — ABNORMAL LOW (ref 6.5–8.1)

## 2018-02-04 LAB — CBC WITH DIFFERENTIAL/PLATELET
Basophils Absolute: 0.1 10*3/uL (ref 0.0–0.1)
Basophils Relative: 0 %
EOS ABS: 0.5 10*3/uL (ref 0.0–0.7)
Eosinophils Relative: 4 %
HCT: 35.6 % — ABNORMAL LOW (ref 39.0–52.0)
HEMOGLOBIN: 11.9 g/dL — AB (ref 13.0–17.0)
LYMPHS ABS: 3.3 10*3/uL (ref 0.7–4.0)
Lymphocytes Relative: 28 %
MCH: 29.3 pg (ref 26.0–34.0)
MCHC: 33.4 g/dL (ref 30.0–36.0)
MCV: 87.7 fL (ref 78.0–100.0)
Monocytes Absolute: 0.6 10*3/uL (ref 0.1–1.0)
Monocytes Relative: 5 %
NEUTROS PCT: 63 %
Neutro Abs: 7.6 10*3/uL (ref 1.7–7.7)
Platelets: 461 10*3/uL — ABNORMAL HIGH (ref 150–400)
RBC: 4.06 MIL/uL — AB (ref 4.22–5.81)
RDW: 14.5 % (ref 11.5–15.5)
WBC: 12 10*3/uL — AB (ref 4.0–10.5)

## 2018-02-04 LAB — GLUCOSE, CAPILLARY
GLUCOSE-CAPILLARY: 255 mg/dL — AB (ref 70–99)
GLUCOSE-CAPILLARY: 212 mg/dL — AB (ref 70–99)
Glucose-Capillary: 285 mg/dL — ABNORMAL HIGH (ref 70–99)
Glucose-Capillary: 175 mg/dL — ABNORMAL HIGH (ref 70–99)
Glucose-Capillary: 475 mg/dL — ABNORMAL HIGH (ref 70–99)

## 2018-02-04 LAB — MAGNESIUM: MAGNESIUM: 1.7 mg/dL (ref 1.7–2.4)

## 2018-02-04 MED ORDER — INSULIN ASPART 100 UNIT/ML ~~LOC~~ SOLN
6.0000 [IU] | Freq: Three times a day (TID) | SUBCUTANEOUS | Status: DC
  Administered 2018-02-04 (×2): 6 [IU] via SUBCUTANEOUS

## 2018-02-04 MED ORDER — ASPIRIN EC 81 MG PO TBEC
81.0000 mg | DELAYED_RELEASE_TABLET | Freq: Every day | ORAL | Status: DC
  Administered 2018-02-05: 81 mg via ORAL
  Filled 2018-02-04: qty 1

## 2018-02-04 MED ORDER — INSULIN STARTER KIT- SYRINGES (ENGLISH)
1.0000 | Freq: Once | Status: AC
  Administered 2018-02-04: 1
  Filled 2018-02-04: qty 1

## 2018-02-04 MED ORDER — INSULIN ASPART 100 UNIT/ML ~~LOC~~ SOLN
5.0000 [IU] | Freq: Once | SUBCUTANEOUS | Status: AC
  Administered 2018-02-04: 5 [IU] via SUBCUTANEOUS

## 2018-02-04 MED ORDER — POLYETHYLENE GLYCOL 3350 17 G PO PACK: 17.0000 g | PACK | Freq: Every day | ORAL | Status: DC | PRN

## 2018-02-04 MED ORDER — KETOCONAZOLE 2 % EX CREA
TOPICAL_CREAM | Freq: Three times a day (TID) | CUTANEOUS | Status: DC
  Administered 2018-02-04 – 2018-02-05 (×3): via TOPICAL
  Filled 2018-02-04: qty 15

## 2018-02-04 MED ORDER — FLUCONAZOLE 100 MG PO TABS
200.0000 mg | ORAL_TABLET | Freq: Once | ORAL | Status: AC
  Administered 2018-02-04: 200 mg via ORAL
  Filled 2018-02-04: qty 2

## 2018-02-04 MED ORDER — INSULIN ASPART 100 UNIT/ML ~~LOC~~ SOLN: 0.0000 [IU] | Freq: Every day | SUBCUTANEOUS | Status: DC

## 2018-02-04 MED ORDER — INSULIN STARTER KIT- PEN NEEDLES (ENGLISH)
1.0000 | Freq: Once | Status: DC
  Filled 2018-02-04: qty 1

## 2018-02-04 MED ORDER — INSULIN ASPART 100 UNIT/ML ~~LOC~~ SOLN
7.0000 [IU] | Freq: Once | SUBCUTANEOUS | Status: AC
  Administered 2018-02-04: 7 [IU] via SUBCUTANEOUS

## 2018-02-04 MED ORDER — CARVEDILOL 3.125 MG PO TABS
3.1250 mg | ORAL_TABLET | Freq: Two times a day (BID) | ORAL | Status: DC
  Administered 2018-02-04 – 2018-02-05 (×3): 3.125 mg via ORAL
  Filled 2018-02-04 (×3): qty 1

## 2018-02-04 MED ORDER — LIVING WELL WITH DIABETES BOOK
Freq: Once | Status: AC
  Administered 2018-02-04: 11:00:00
  Filled 2018-02-04: qty 1

## 2018-02-04 MED ORDER — INSULIN ASPART 100 UNIT/ML ~~LOC~~ SOLN
0.0000 [IU] | Freq: Three times a day (TID) | SUBCUTANEOUS | Status: DC
  Administered 2018-02-04: 3 [IU] via SUBCUTANEOUS
  Administered 2018-02-04: 5 [IU] via SUBCUTANEOUS
  Administered 2018-02-05: 11 [IU] via SUBCUTANEOUS
  Administered 2018-02-05: 8 [IU] via SUBCUTANEOUS

## 2018-02-04 MED ORDER — SENNOSIDES-DOCUSATE SODIUM 8.6-50 MG PO TABS
2.0000 | ORAL_TABLET | Freq: Two times a day (BID) | ORAL | Status: DC
  Administered 2018-02-04 – 2018-02-05 (×3): 2 via ORAL
  Filled 2018-02-04 (×4): qty 2

## 2018-02-04 MED ORDER — ATORVASTATIN CALCIUM 40 MG PO TABS
40.0000 mg | ORAL_TABLET | Freq: Every day | ORAL | Status: DC
  Administered 2018-02-04 – 2018-02-05 (×2): 40 mg via ORAL
  Filled 2018-02-04 (×2): qty 1

## 2018-02-04 MED ORDER — ACETAMINOPHEN 325 MG PO TABS: 650.0000 mg | ORAL_TABLET | Freq: Four times a day (QID) | ORAL | Status: DC | PRN

## 2018-02-04 NOTE — Progress Notes (Signed)
Inpatient Diabetes Program Recommendations  AACE/ADA: New Consensus Statement on Inpatient Glycemic Control (2015)  Target Ranges:  Prepandial:   less than 140 mg/dL      Peak postprandial:   less than 180 mg/dL (1-2 hours)      Critically ill patients:  140 - 180 mg/dL   Lab Results  Component Value Date   GLUCAP 255 (H) 02/04/2018   HGBA1C 6.8 (H) 08/08/2016    Review of Glycemic Control  Diabetes history: DM2 Outpatient Diabetes medications: patient reports not taking Current orders for Inpatient glycemic control: Lantus 18 units+ Nov 5 units tid meal coverage + sens correction tid + hs  Inpatient Diabetes Program Recommendations:   Noted recent A1c >14. Will plan to speak to patient in person tomorrow. Agree with current treatment plan. Ordered Living Well With Diabetes Book, dietician consult, and insulin pen starter kit.  Thank you, Nani Gasser. Todd Real, RN, MSN, CDE  Diabetes Coordinator Inpatient Glycemic Control Team Team Pager [phone] (8am-5pm) 02/04/2018 7:30 AM

## 2018-02-04 NOTE — Progress Notes (Signed)
PROGRESS NOTE   Todd [name]  OEU:[id number]  DOB: 02/12/1956  DOA: 02/03/2018 PCP: [name], MD   Brief Admission Hx: 62 y.o. male with known diabetes mellitus of uncertain type, had been insulin requiring, hypertension, hyperlipidemia, peripheral vascular disease and neuropathy status post bilateral AKA and hypothyroidism presented to the emergency room by EMS after being notified by his primary care physician that he needed to come to the ED because his blood glucose was greater than 1000.   MDM/Assessment & Plan:   1. Diabetic ketoacidosis - The patient has been transitioned off the IV insulin infusion and now on basal bolus supplemental insulin coverage.  Transfer out of SDU to med surg.   2. Uncontrolled diabetes mellitus type 1.5 - He will need indefinite insulin therapy as evidenced by an A1c>14%.  He had stopped taking his insulin shots at home.  He needs intensive diabetes education which can be done outpatient.  I have consulted diabetes coordinator team.   3. Alcohol intoxication- He was treated with supportive care.   He has been placed on a CIWA protocol to monitor for alcohol withdrawal.  Supplemental vitamin therapy ordered.  Follow. 4. Leukocytosis-AM chest xray pending. No sign of infection found so far.  Investigating.  5. Severe intertrigo rash - fungal rash being treated with oral fluconazole and topical ketoconazole creme. 6. Tobacco abuse- strongly advised to discontinue all tobacco use.  The patient verbalizes understanding. 7. Reactive thrombocytosis- Following.  8. Constipation - peri-colace ordered.  9. Acute kidney injury on CKD stage III-Improved. Treating with IV hydration, Avoiding nephrotoxins. 10. Hypothyroidism- would not check TSH in setting of acute critical illness, follow-up outpatient with PCP. 11. Essential hypertension- monitor blood pressure and provide IV medications as needed.   DVT Prophylaxis: Lovenox Code Status: Full Family  Communication: Patient Disposition Plan: Home tomorrow  Consultants:  Diabetes coordinator  Subjective: Pt without complaints.  He has not had a bowel movement.   Objective: Vitals:   02/03/18 2200 02/04/18 0000 02/04/18 0400 02/04/18 0500  BP: 113/67     Pulse: 82     Resp: 14     Temp:  98.7 F (37.1 C) 98.5 F (36.9 C)   TempSrc:  Oral Axillary   SpO2: 100%     Weight:    60.4 kg  Height:        Intake/Output Summary (Last 24 hours) at 02/04/2018 0640 Last data filed at 02/03/2018 2032 Gross per 24 hour  Intake 4012.5 ml  Output 175 ml  Net 3837.5 ml   Filed Weights   02/03/18 1009 02/03/18 1300 02/04/18 0500  Weight: 79.4 kg 60.3 kg 60.4 kg   REVIEW OF SYSTEMS  As per history otherwise all reviewed and reported negative  Exam:  General exam: awake, alert, oriented, NAD.  Respiratory system: Clear. No increased work of breathing. Cardiovascular system: S1 & S2 heard, RRR. No JVD, murmurs, gallops, clicks or pedal edema. Gastrointestinal system: Abdomen is nondistended, soft and nontender. Normal bowel sounds heard. Central nervous system: Alert and oriented. No focal neurological deficits. Extremities: bilateral AKA.  Data Reviewed: Basic Metabolic Panel: Recent Labs  Lab 02/02/18 1149 02/03/18 1024 02/03/18 1308 02/03/18 1720  NA 124* 128* 134* 142  K 4.7 4.0 3.2* 2.9*  CL 78* 89* 99 112*  CO2 16* 17* 17* 22  GLUCOSE 1,157* 954* 672* 110*  BUN 20 23 21 17   CREATININE 2.02* 2.06* 1.85* 1.29*  CALCIUM 8.9 8.0* 7.9* 7.7*   Liver Function Tests: Recent Labs  Lab 02/02/18 1149 02/03/18 1024  AST 15 14*  ALT 14 17  ALKPHOS 156* 120  BILITOT 0.4 1.2  PROT 8.7* 7.8  ALBUMIN 3.7 2.8*   No results for input(s): LIPASE, AMYLASE in the last 168 hours. No results for input(s): AMMONIA in the last 168 hours. CBC: Recent Labs  Lab 02/02/18 1149 02/03/18 1024  WBC 9.9 11.0*  NEUTROABS 8.1* 8.5*  HGB 13.6 13.3  HCT 44.4 40.5  MCV 97 92.3    PLT 558* 482*   Cardiac Enzymes: No results for input(s): CKTOTAL, CKMB, CKMBINDEX, TROPONINI in the last 168 hours. CBG (last 3)  Recent Labs    02/03/18 2206 02/03/18 2302 02/04/18 0305  GLUCAP 194* 200* 255*   Recent Results (from the past 240 hour(s))  MRSA PCR Screening     Status: None   Collection Time: 02/03/18 12:45 PM  Result Value Ref Range Status   MRSA by PCR NEGATIVE NEGATIVE Final    Comment:        The GeneXpert MRSA Assay (FDA approved for NASAL specimens only), is one component of a comprehensive MRSA colonization surveillance program. It is not intended to diagnose MRSA infection nor to guide or monitor treatment for MRSA infections. Performed at [facility], [address] [place], [place] [address]     Studies: No results found.  Scheduled Meds: . enoxaparin (LOVENOX) injection  40 mg Subcutaneous Q24H  . folic acid  1 mg Intravenous Daily  . insulin aspart  0-5 Units Subcutaneous QHS  . insulin aspart  0-9 Units Subcutaneous TID WC  . insulin aspart  5 Units Subcutaneous TID WC  . insulin glargine  18 Units Subcutaneous Q24H  . thiamine injection  100 mg Intravenous Daily   Continuous Infusions: . 0.9 % NaCl with KCl 20 mEq / L 60 mL/hr at 02/03/18 1926    Principal Problem:   DKA (diabetic ketoacidoses) ([place]) Active Problems:   HTN (hypertension)   Hypothyroidism   Diabetes type 2, uncontrolled ([place])   Hyperuricemia   ARF (acute renal failure) ([facility])   Diabetic polyneuropathy associated with type 2 diabetes mellitus ([place])   History of right above knee amputation ([facility])   Urinary incontinence   Above knee amputation of left lower extremity ([place])   Alcohol intoxication ([place])   Tobacco abuse   Hyperglycemia   Leukocytosis   Reactive thrombocytosis  Time spent:   Irwin Brakeman, MD, FAAFP [facility] Pager [phone]  If 7PM-7AM, please contact night-coverage [email or web address] Password [email or web address] 02/04/2018, 6:40 AM     LOS: 1 day

## 2018-02-05 LAB — BASIC METABOLIC PANEL
ANION GAP: 10 (ref 5–15)
BUN: 12 mg/dL (ref 8–23)
CO2: 18 mmol/L — AB (ref 22–32)
CREATININE: 1.4 mg/dL — AB (ref 0.61–1.24)
Calcium: 7.3 mg/dL — ABNORMAL LOW (ref 8.9–10.3)
Chloride: 105 mmol/L (ref 98–111)
GFR, EST NON AFRICAN AMERICAN: 52 mL/min — AB (ref >60)
GLUCOSE: 456 mg/dL — AB (ref 70–99)
Potassium: 3 mmol/L — ABNORMAL LOW (ref 3.5–5.1)
Sodium: 133 mmol/L — ABNORMAL LOW (ref 135–145)

## 2018-02-05 LAB — GLUCOSE, CAPILLARY
GLUCOSE-CAPILLARY: 462 mg/dL — AB (ref 70–99)
GLUCOSE-CAPILLARY: 329 mg/dL — AB (ref 70–99)
Glucose-Capillary: 489 mg/dL — ABNORMAL HIGH (ref 70–99)
Glucose-Capillary: 286 mg/dL — ABNORMAL HIGH (ref 70–99)

## 2018-02-05 IMAGING — US US RENAL PORT
1 series · 14 of 25 positions shown · non-contrast
Comparison: Renal ultrasound performed 07/04/2016

CLINICAL DATA: Acute onset of renal insufficiency. Initial
encounter.

EXAM:
RENAL / URINARY TRACT ULTRASOUND COMPLETE

[Series 1: us renal port · 0.23mm/px · 14 of 25 slices shown]
[im 1/25]
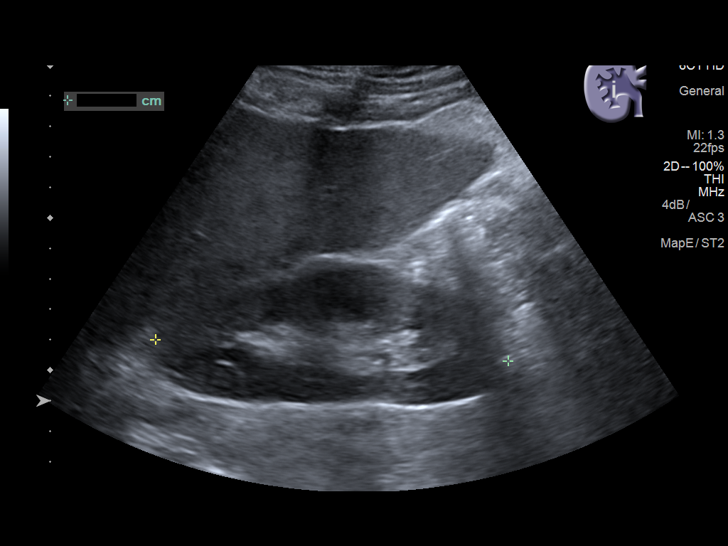
[im 3/25]
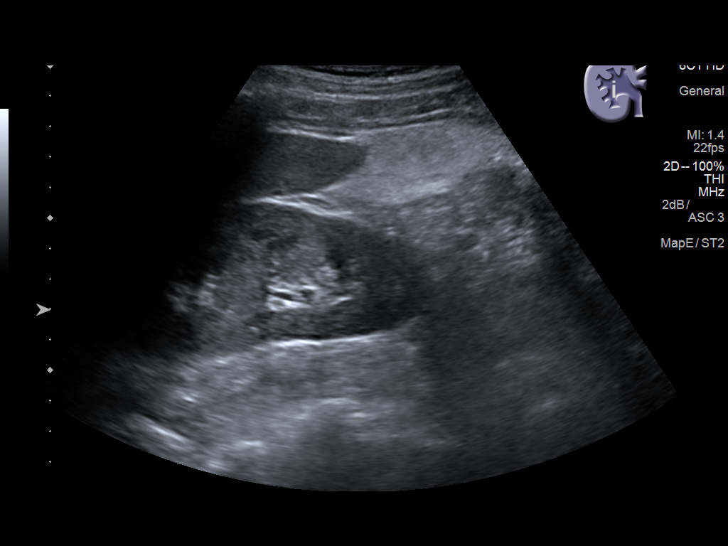
[im 5/25]
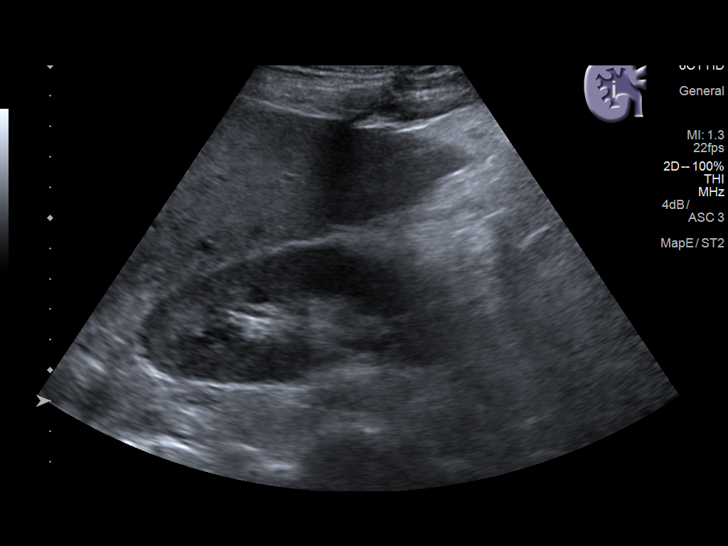
[im 7/25]
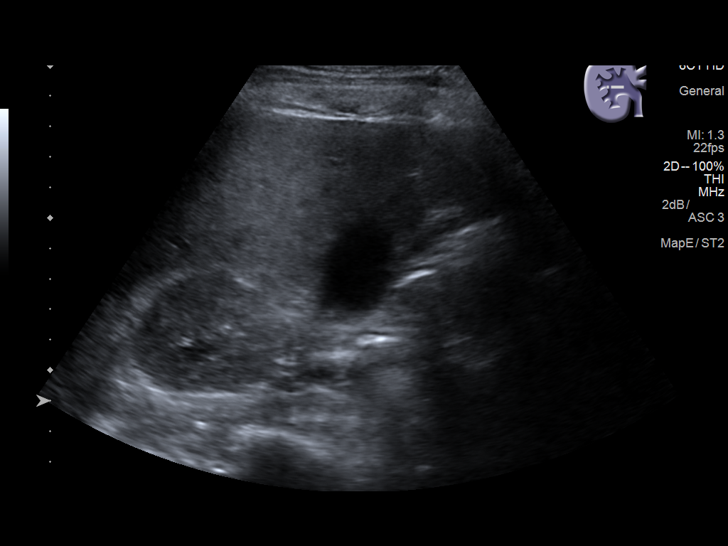
[im 9/25]
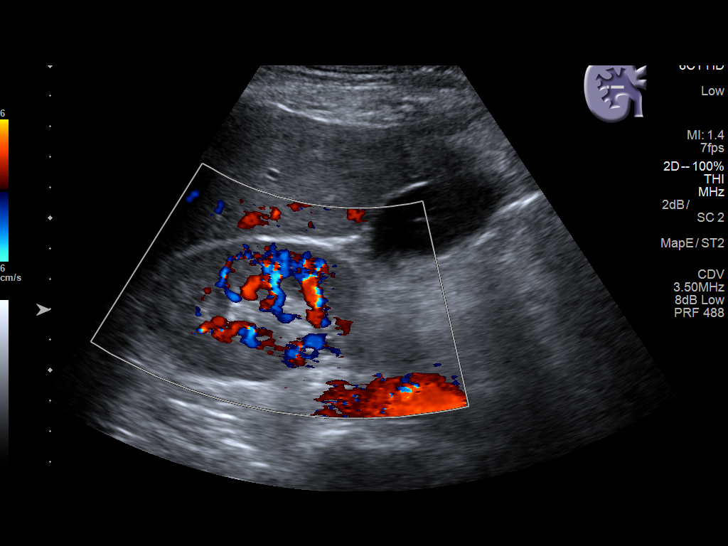
[im 10/25]
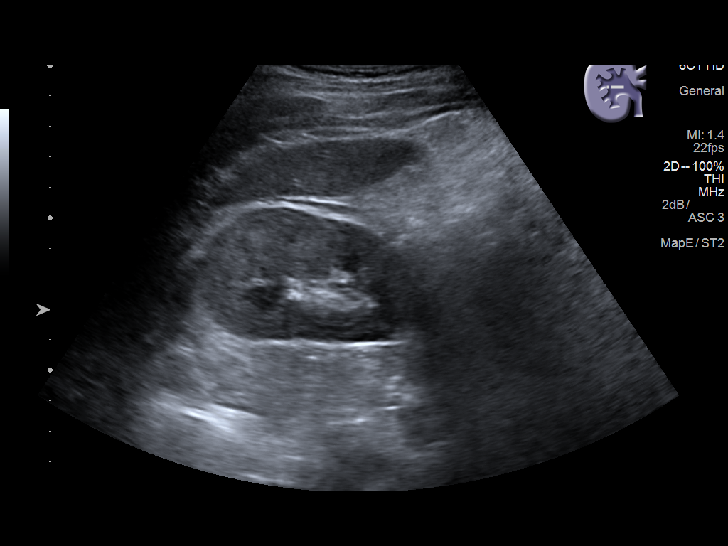
[im 12/25]
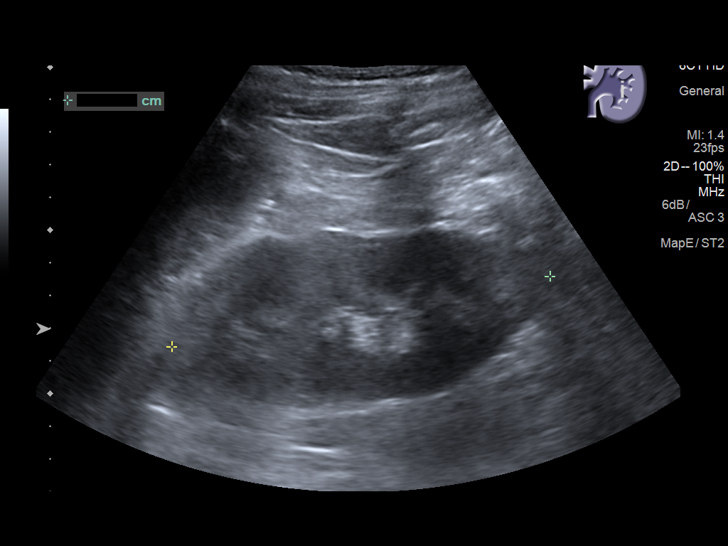
[im 14/25]
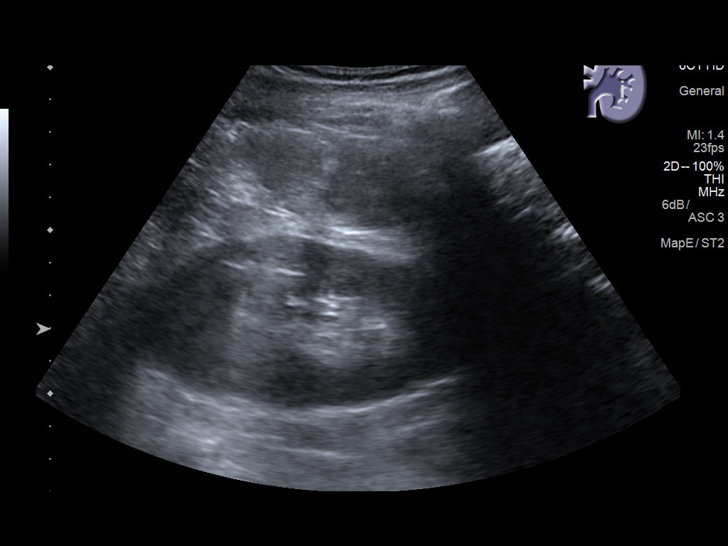
[im 16/25]
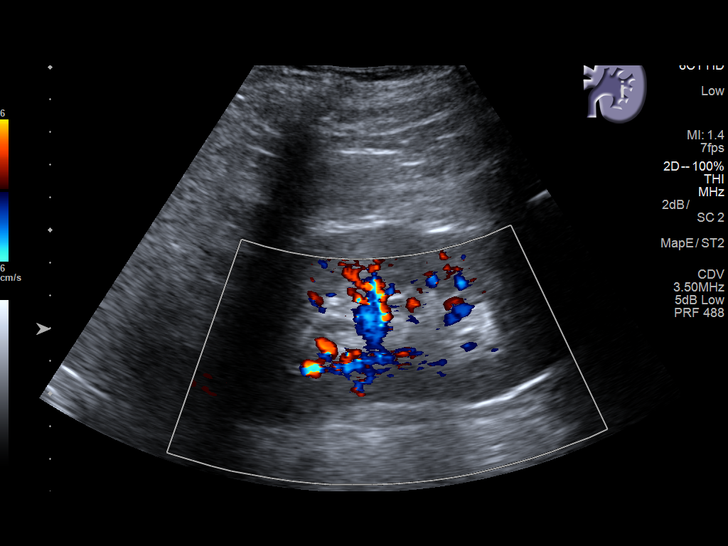
[im 17/25]
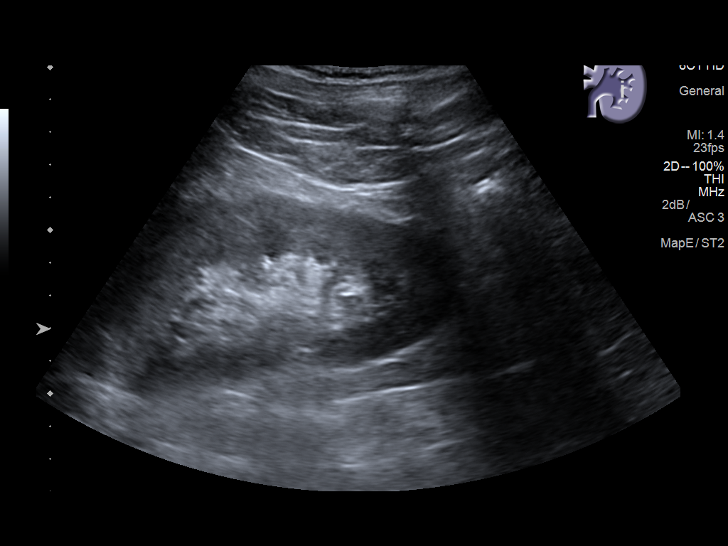
[im 19/25]
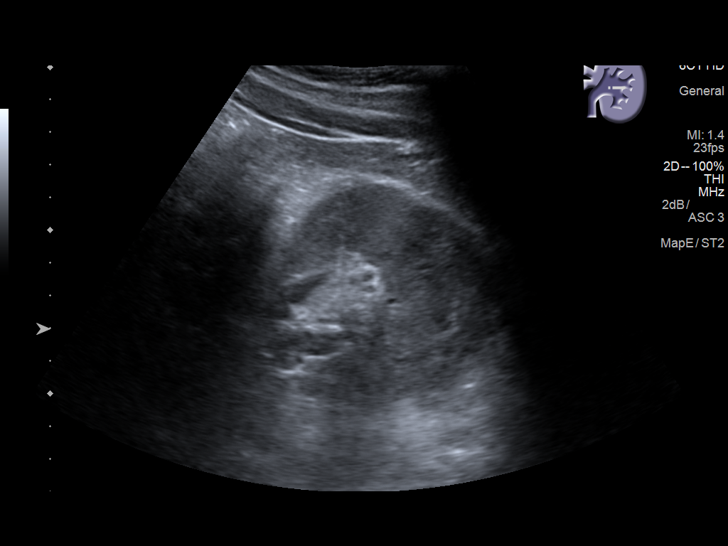
[im 21/25]
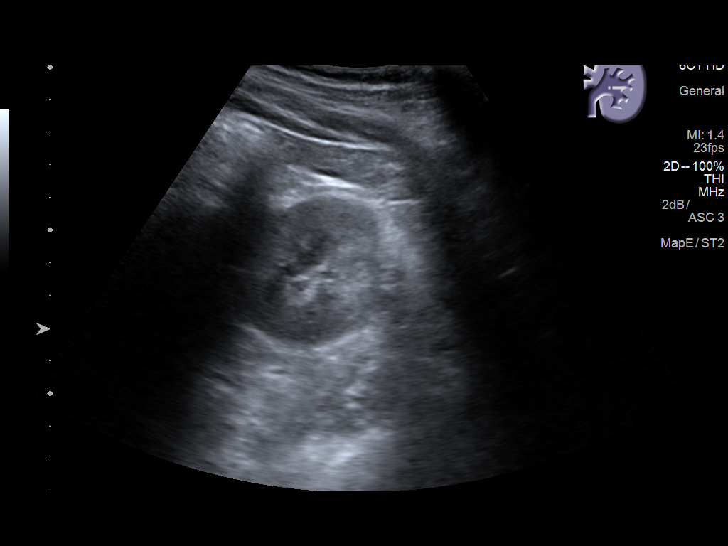
[im 23/25]
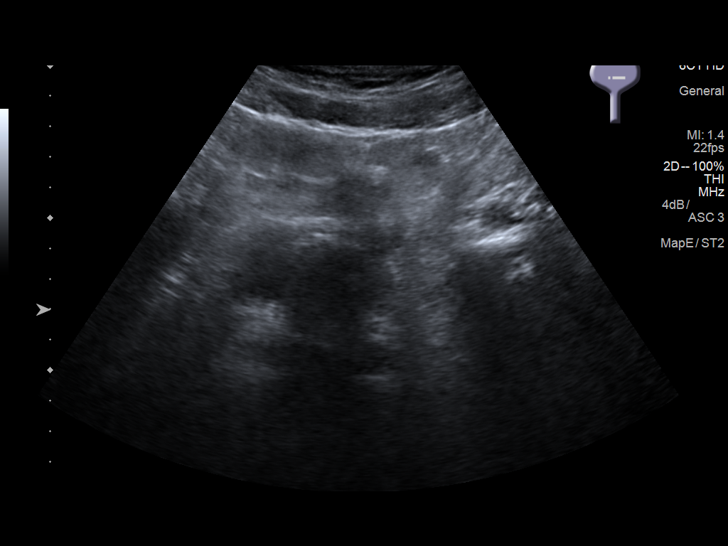
[im 25/25]
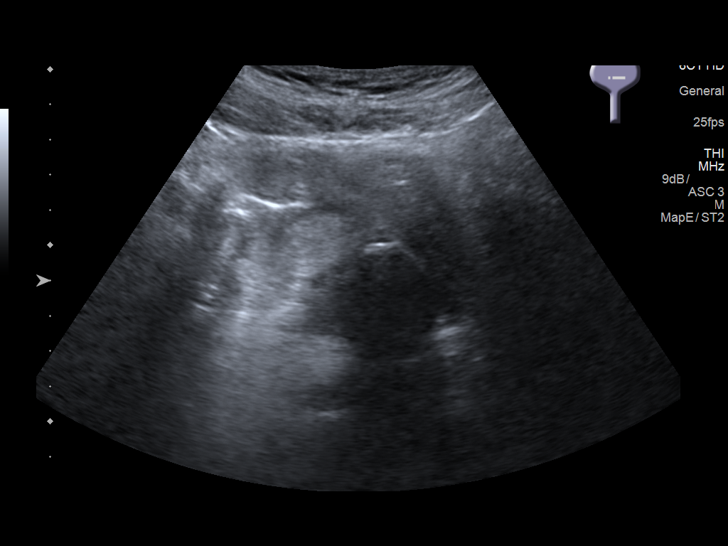

[14 of 25 positions shown; findings below may reference images not displayed]

FINDINGS: Right Kidney:

Length: 11.6 cm. Echogenicity within normal limits. No mass or
hydronephrosis visualized.

Left Kidney:

Length: 11.8 cm. Echogenicity within normal limits. No mass or
hydronephrosis visualized.

Bladder:

Decompressed, with a Foley catheter in place.
IMPRESSION: Unremarkable renal ultrasound.  No evidence of hydronephrosis.

## 2018-02-05 MED ORDER — ATORVASTATIN CALCIUM 40 MG PO TABS: 40.0000 mg | ORAL_TABLET | Freq: Every day | ORAL | 0 refills | Status: DC

## 2018-02-05 MED ORDER — POTASSIUM CHLORIDE ER 10 MEQ PO TBCR: 10.0000 meq | EXTENDED_RELEASE_TABLET | Freq: Every day | ORAL | 0 refills | Status: DC

## 2018-02-05 MED ORDER — FOLIC ACID 1 MG PO TABS: 1.0000 mg | ORAL_TABLET | Freq: Every day | ORAL | Status: DC

## 2018-02-05 MED ORDER — BLOOD GLUCOSE METER KIT: PACK | 0 refills | Status: DC

## 2018-02-05 MED ORDER — INSULIN ASPART 100 UNIT/ML ~~LOC~~ SOLN
20.0000 [IU] | Freq: Once | SUBCUTANEOUS | Status: AC
  Administered 2018-02-05: 20 [IU] via SUBCUTANEOUS

## 2018-02-05 MED ORDER — INSULIN ASPART 100 UNIT/ML ~~LOC~~ SOLN
8.0000 [IU] | Freq: Three times a day (TID) | SUBCUTANEOUS | Status: DC
  Administered 2018-02-05 (×2): 8 [IU] via SUBCUTANEOUS

## 2018-02-05 MED ORDER — THIAMINE HCL 100 MG PO TABS: 100.0000 mg | ORAL_TABLET | Freq: Every day | ORAL | Status: DC

## 2018-02-05 MED ORDER — CARVEDILOL 3.125 MG PO TABS: 3.1250 mg | ORAL_TABLET | Freq: Two times a day (BID) | ORAL | 0 refills | Status: DC

## 2018-02-05 MED ORDER — KETOCONAZOLE 2 % EX CREA: TOPICAL_CREAM | Freq: Three times a day (TID) | CUTANEOUS | 1 refills | Status: DC

## 2018-02-05 MED ORDER — INSULIN ASPART PROT & ASPART (70-30 MIX) 100 UNIT/ML PEN: 18.0000 [IU] | PEN_INJECTOR | Freq: Two times a day (BID) | SUBCUTANEOUS | 0 refills | Status: DC

## 2018-02-05 MED ORDER — INSULIN PEN NEEDLE 31G X 5 MM MISC: 1.0000 | 1 refills | Status: DC

## 2018-02-05 MED ORDER — POTASSIUM CHLORIDE CRYS ER 20 MEQ PO TBCR
60.0000 meq | EXTENDED_RELEASE_TABLET | Freq: Once | ORAL | Status: AC
  Administered 2018-02-05: 60 meq via ORAL
  Filled 2018-02-05: qty 3

## 2018-02-05 MED ORDER — INSULIN GLARGINE 100 UNIT/ML ~~LOC~~ SOLN
20.0000 [IU] | SUBCUTANEOUS | Status: DC
  Filled 2018-02-05: qty 0.2

## 2018-02-05 MED ORDER — DOCUSATE SODIUM 100 MG PO CAPS: 100.0000 mg | ORAL_CAPSULE | Freq: Two times a day (BID) | ORAL | 0 refills | Status: DC

## 2018-02-05 MED ORDER — VITAMIN B-1 100 MG PO TABS: 100.0000 mg | ORAL_TABLET | Freq: Every day | ORAL | Status: DC

## 2018-02-05 MED ORDER — INSULIN ASPART PROT & ASPART (70-30 MIX) 100 UNIT/ML ~~LOC~~ SUSP
18.0000 [IU] | Freq: Two times a day (BID) | SUBCUTANEOUS | Status: DC
  Administered 2018-02-05: 18 [IU] via SUBCUTANEOUS
  Filled 2018-02-05 (×2): qty 10

## 2018-02-05 NOTE — Progress Notes (Signed)
Inpatient Diabetes Program Recommendations  AACE/ADA: New Consensus Statement on Inpatient Glycemic Control (2015)  Target Ranges:  Prepandial:   less than 140 mg/dL      Peak postprandial:   less than 180 mg/dL (1-2 hours)      Critically ill patients:  140 - 180 mg/dL   Lab Results  Component Value Date   GLUCAP 286 (H) 02/05/2018   HGBA1C 6.8 (H) 08/08/2016    Review of Glycemic Control  Inpatient Diabetes Program Recommendations:   Spoke with patient by phone (DM coordinator off campus) to discuss use of 70/30 insulin bid ac meals. Reviewed that the 30% of the insulin is to cover the meal so take ac breakfast and supper. Patient states he has been on the insulin pen and prefers to continue on the pen. Also reviewed hypoglycemia protocol. Patient has meter and strips @ home and reviewed to check CBGs.  Thank you, Nani Gasser. Lamount Bankson, RN, MSN, CDE  Diabetes Coordinator Inpatient Glycemic Control Team Team Pager [phone] (8am-5pm) 02/05/2018 1:56 PM

## 2018-02-05 NOTE — Plan of Care (Signed)
Adequate for discharge.

## 2018-02-05 NOTE — Discharge Instructions (Signed)
Please check your blood sugars 4 times per day.   Please see your primary care provider in next 3-5 days for recheck.  Seek medical care or return to ER if symptoms come back, worsen or new problem develops.  Please see diabetes specialist as soon as possible.   Don't give insulin injection if you don't eat a meal. Please write down your blood sugars and report them to your primary care physician and diabetologist.      Insulin Injection Instructions, Using Insulin Pens, Adult A subcutaneous injection is a shot of medicine that is injected into the layer of fat between skin and muscle. People with type 1 diabetes must take insulin because their bodies do not make it. People with type 2 diabetes may need to take insulin. There are many different types of insulin. The type of insulin that you take may determine how many injections you give yourself and when you need to take the injections. Choosing a site for injection Insulin absorption varies from site to site. It is best to inject insulin within the same body area, using a different spot in that area for each injection. Do not inject the insulin in the same spot for each injection. There are five main areas that can be used for injecting. These areas include:  Abdomen. This is the preferred area.  Front of thigh.  Upper, outer side of thigh.  Back of upper arm.  Buttocks.  Using an insulin pen First, follow the steps for Getting Ready, then continue with the steps for Injecting the Insulin. Getting Ready 1. Wash your hands with soap and water. If soap and water are not available, use hand sanitizer. 2. Check the expiration date and type of insulin in the pen. 3. If you are using CLEAR insulin, check to see that it is clear and free of clumps. 4. If you are using CLOUDY insulin, gently roll the pen between your palms several times, or tip the pen up and down several times to mix up the medicine. Do not shake the pen. 5. Remove the cap  from the insulin pen. 6. Use an alcohol wipe to clean the rubber stopper of the pen cartridge. 7. Remove the protective paper tab from the disposable needle. Do not let the needle touch anything. 8. Screw the needle onto the pen. 9. Remove the outer and inner plastic covers from the needle. Do not throw away the outer plastic cover yet. 10. Prime the insulin pen by turning the button (dial) to 2 units. Hold the pen with the needle pointing up, and push the button on the opposite end of the pen until a drop of insulin appears at the needle tip. If no insulin appears, repeat this step. 11. Dial the number of units of insulin that you will be injecting. Injecting the Insulin  1. Use an alcohol wipe to clean the site where you will be injecting the needle. Let the site air-dry. 2. Hold the pen in the palm of your writing hand with your thumb on the top. 3. If directed by your health care provider, use your other hand to pinch and hold about an inch of skin at the injection site. Do not directly touch the cleaned part of the skin. 4. Gently but quickly, put the needle straight into the skin. The needle should be at a 90-degree angle (perpendicular) to the skin, as if to form the letter "L." ? For example, if you are giving an injection in the abdomen,  the abdomen forms one "leg" of the "L" and the needle forms the other "leg" of the "L." 5. For adults who have a small amount of body fat, the needle may need to be injected at a 45-degree angle instead. Your health care provider will tell you if this is necessary. ? A 45-degree angle looks like the letter "V." 6. When the needle is completely inserted into the skin, use the thumb of your writing hand to push the top button of the pen down all the way to inject the insulin. 7. Let go of the skin that you are pinching. Continue to hold the pen in place with your writing hand. 8. Wait five seconds, then pull the needle straight out of the skin. 9. Carefully  put the larger (outer) plastic cover of the needle back over the needle, then unscrew the capped needle and discard it in a sharps container, such as an empty plastic bottle with a cover. 10. Put the plastic cap back on the insulin pen. Throwing away supplies  Discard all used needles in a puncture-proof sharps disposal container. You can ask your local pharmacy about where you can get this kind of disposal container, or you can use an empty liquid laundry detergent bottle that has a cover.  Follow the disposal regulations for the area where you live. Do not use any needle more than one time.  Throw away empty disposable pens in the regular trash. What questions should I ask my health care provider?  How often should I be taking insulin?  How often should I check my blood glucose?  What amount of insulin should I be taking at each time?  What are the side effects?  What should I do if my blood glucose is too high?  What should I do if my blood glucose is too low?  What should I do if I forget to take my insulin?  What number should I call if I have questions? Where can I get more information?  American Diabetes Association (ADA): www.diabetes.org  American Association of Diabetes Educators (AADE) Patient Resources: https://www.diabeteseducator.org/patient-resources This information is not intended to replace advice given to you by your health care provider. Make sure you discuss any questions you have with your health care provider. Document Released: 07/03/2015 Document Revised: 11/05/2015 Document Reviewed: 07/03/2015 Elsevier Interactive Patient Education  2018 [place].     Blood Glucose Monitoring, Adult Monitoring your blood sugar (glucose) helps you manage your diabetes. It also helps you and your health care provider determine how well your diabetes management plan is working. Blood glucose monitoring involves checking your blood glucose as often as directed, and  keeping a record (log) of your results over time. Why should I monitor my blood glucose? Checking your blood glucose regularly can:  Help you understand how food, exercise, illnesses, and medicines affect your blood glucose.  Let you know what your blood glucose is at any time. You can quickly tell if you are having low blood glucose (hypoglycemia) or high blood glucose (hyperglycemia).  Help you and your health care provider adjust your medicines as needed.  When should I check my blood glucose? Follow instructions from your health care provider about how often to check your blood glucose. This may depend on:  The type of diabetes you have.  How well-controlled your diabetes is.  Medicines you are taking.  If you have type 1 diabetes:  Check your blood glucose at least 2 times a day.  Also  check your blood glucose: ? Before every insulin injection. ? Before and after exercise. ? Between meals. ? 2 hours after a meal. ? Occasionally between 2:00 a.m. and 3:00 a.m., as directed. ? Before potentially dangerous tasks, like driving or using heavy machinery. ? At bedtime.  You may need to check your blood glucose more often, up to 6-10 times a day: ? If you use an insulin pump. ? If you need multiple daily injections (MDI). ? If your diabetes is not well-controlled. ? If you are ill. ? If you have a history of severe hypoglycemia. ? If you have a history of not knowing when your blood glucose is getting low (hypoglycemia unawareness). If you have type 2 diabetes:  If you take insulin or other diabetes medicines, check your blood glucose at least 2 times a day.  If you are on intensive insulin therapy, check your blood glucose at least 4 times a day. Occasionally, you may also need to check between 2:00 a.m. and 3:00 a.m., as directed.  Also check your blood glucose: ? Before and after exercise. ? Before potentially dangerous tasks, like driving or using heavy  machinery.  You may need to check your blood glucose more often if: ? Your medicine is being adjusted. ? Your diabetes is not well-controlled. ? You are ill. What is a blood glucose log?  A blood glucose log is a record of your blood glucose readings. It helps you and your health care provider: ? Look for patterns in your blood glucose over time. ? Adjust your diabetes management plan as needed.  Every time you check your blood glucose, write down your result and notes about things that may be affecting your blood glucose, such as your diet and exercise for the day.  Most glucose meters store a record of glucose readings in the meter. Some meters allow you to download your records to a computer. How do I check my blood glucose? Follow these steps to get accurate readings of your blood glucose: Supplies needed   Blood glucose meter.  Test strips for your meter. Each meter has its own strips. You must use the strips that come with your meter.  A needle to prick your finger (lancet). Do not use lancets more than once.  A device that holds the lancet (lancing device).  A journal or log book to write down your results. Procedure  Wash your hands with soap and water.  Prick the side of your finger (not the tip) with the lancet. Use a different finger each time.  Gently rub the finger until a small drop of blood appears.  Follow instructions that come with your meter for inserting the test strip, applying blood to the strip, and using your blood glucose meter.  Write down your result and any notes. Alternative testing sites  Some meters allow you to use areas of your body other than your finger (alternative sites) to test your blood.  If you think you may have hypoglycemia, or if you have hypoglycemia unawareness, do not use alternative sites. Use your finger instead.  Alternative sites may not be as accurate as the fingers, because blood flow is slower in these areas. This  means that the result you get may be delayed, and it may be different from the result that you would get from your finger.  The most common alternative sites are: ? Forearm. ? Thigh. ? Palm of the hand. Additional tips  Always keep your supplies with you.  If you have questions or need help, all blood glucose meters have a 24-hour hotline number that you can call. You may also contact your health care provider.  After you use a few boxes of test strips, adjust (calibrate) your blood glucose meter by following instructions that came with your meter. This information is not intended to replace advice given to you by your health care provider. Make sure you discuss any questions you have with your health care provider. Document Released: 06/02/2003 Document Revised: 12/18/2015 Document Reviewed: 11/09/2015 Elsevier Interactive Patient Education  2017 [place].   Hypoglycemia Hypoglycemia is when the sugar (glucose) level in the blood is too low. Symptoms of low blood sugar may include:  Feeling: ? Hungry. ? Worried or nervous (anxious). ? Sweaty and clammy. ? Confused. ? Dizzy. ? Sleepy. ? Sick to your stomach (nauseous).  Having: ? A fast heartbeat. ? A headache. ? A change in your vision. ? Jerky movements that you cannot control (seizure). ? Nightmares. ? Tingling or no feeling (numbness) around the mouth, lips, or tongue.  Having trouble with: ? Talking. ? Paying attention (concentrating). ? Moving (coordination). ? Sleeping.  Shaking.  Passing out (fainting).  Getting upset easily (irritability).  Low blood sugar can happen to people who have diabetes and people who do not have diabetes. Low blood sugar can happen quickly, and it can be an emergency. Treating Low Blood Sugar Low blood sugar is often treated by eating or drinking something sugary right away. If you can think clearly and swallow safely, follow the 15:15 rule:  Take 15 grams of a  fast-acting carb (carbohydrate). Some fast-acting carbs are: ? 1 tube of glucose gel. ? 3 sugar tablets (glucose pills). ? 6-8 pieces of hard candy. ? 4 oz (120 mL) of fruit juice. ? 4 oz (120 mL) of regular (not diet) soda.  Check your blood sugar 15 minutes after you take the carb.  If your blood sugar is still at or below 70 mg/dL (3.9 mmol/L), take 15 grams of a carb again.  If your blood sugar does not go above 70 mg/dL (3.9 mmol/L) after 3 tries, get help right away.  After your blood sugar goes back to normal, eat a meal or a snack within 1 hour.  Treating Very Low Blood Sugar If your blood sugar is at or below 54 mg/dL (3 mmol/L), you have very low blood sugar (severe hypoglycemia). This is an emergency. Do not wait to see if the symptoms will go away. Get medical help right away. Call your local emergency services (911 in the [place] Do not drive yourself to the hospital. If you have very low blood sugar and you cannot eat or drink, you may need a glucagon shot (injection). A family member or friend should learn how to check your blood sugar and how to give you a glucagon shot. Ask your doctor if you need to have a glucagon shot kit at home. Follow these instructions at home: General instructions  Avoid any diets that cause you to not eat enough food. Talk with your doctor before you start any new diet.  Take over-the-counter and prescription medicines only as told by your doctor.  Limit alcohol to no more than 1 drink per day for nonpregnant women and 2 drinks per day for men. One drink equals 12 oz of beer, 5 oz of wine, or 1 oz of hard liquor.  Keep all follow-up visits as told by your doctor. This is important. If  You Have Diabetes:   Make sure you know the symptoms of low blood sugar.  Always keep a source of sugar with you, such as: ? Sugar. ? Sugar tablets. ? Glucose gel. ? Fruit juice. ? Regular soda (not diet soda). ? Milk. ? Hard candy. ? Honey.  Take  your medicines as told.  Follow your exercise and meal plan. ? Eat on time. Do not skip meals. ? Follow your sick day plan when you cannot eat or drink normally. Make this plan ahead of time with your doctor.  Check your blood sugar as often as told by your doctor. Always check before and after exercise.  Share your diabetes care plan with: ? Your work or school. ? People you live with.  Check your pee (urine) for ketones: ? When you are sick. ? As told by your doctor.  Carry a card or wear jewelry that says you have diabetes. If You Have Low Blood Sugar From Other Causes:   Check your blood sugar as often as told by your doctor.  Follow instructions from your doctor about what you cannot eat or drink. Contact a doctor if:  You have trouble keeping your blood sugar in your target range.  You have low blood sugar often. Get help right away if:  You still have symptoms after you eat or drink something sugary.  Your blood sugar is at or below 54 mg/dL (3 mmol/L).  You have jerky movements that you cannot control.  You pass out. These symptoms may be an emergency. Do not wait to see if the symptoms will go away. Get medical help right away. Call your local emergency services (911 in the [place] Do not drive yourself to the hospital. This information is not intended to replace advice given to you by your health care provider. Make sure you discuss any questions you have with your health care provider. Document Released: 08/24/2009 Document Revised: 11/05/2015 Document Reviewed: 07/03/2015 Elsevier Interactive Patient Education  [employer].  If you wish to quit smoking, help is available.  For free tobacco cessation program offerings call the [facility] at [phone] or Live Well Line at [phone]. You may also visit [email or web address] or email [email or web address]  for more information on other programs.   Tobacco Use Disorder Tobacco use  disorder (TUD) is a mental disorder. It is the long-term use of tobacco in spite of related health problems or difficulty with normal life activities. Tobacco is most commonly smoked as cigarettes and less commonly as cigars or pipes. Smokeless chewing tobacco and snuff are also popular. People with TUD get a feeling of extreme pleasure (euphoria) from using tobacco and have a desire to use it again and again. Repeated use of tobacco can cause problems. The addictive effects of tobacco are due mainly tothe ingredient nicotine. Nicotine also causes a rush of adrenaline (epinephrine) in the body. This leads to increased blood pressure, heart rate, and breathing rate. These changes may cause problems for people with high blood pressure, weak hearts, or lung disease. High doses of nicotine in children and pets can lead to seizures and death. Tobacco contains a number of other unsafe chemicals. These chemicals are especially harmful when inhaled as smoke and can damage almost every organ in the body. Smokers live shorter lives than nonsmokers and are at risk of dying from a number of diseases and cancers. Tobacco smoke can also cause health problems for nonsmokers (due to inhaling secondhand smoke). Smoking is  also a fire hazard. TUD usually starts in the late teenage years and is most common in young adults between the ages of 96 and 69 years. People who start smoking earlier in life are more likely to continue smoking as adults. TUD is somewhat more common in men than women. People with TUD are at higher risk for using alcohol and other drugs of abuse. What increases the risk? Risk factors for TUD include:  Having family members with the disorder.  Being around people who use tobacco.  Having an existing mental health issue such as schizophrenia, depression, bipolar disorder, ADHD, or posttraumatic stress disorder (PTSD).  What are the signs or symptoms? People with tobacco use disorder have two or more  of the following signs and symptoms within 12 months:  Use of more tobacco over a longer period than intended.  Not able to cut down or control tobacco use.  A lot of time spent obtaining or using tobacco.  Strong desire or urge to use tobacco (craving). Cravings may last for 6 months or longer after quitting.  Use of tobacco even when use leads to major problems at work, school, or home.  Use of tobacco even when use leads to relationship problems.  Giving up or cutting down on important life activities because of tobacco use.  Repeatedly using tobacco in situations where it puts you or others in physical danger, like smoking in bed.  Use of tobacco even when it is known that a physical or mental problem is likely related to tobacco use. ? Physical problems are numerous and may include chronic bronchitis, emphysema, lung and other cancers, gum disease, high blood pressure, heart disease, and stroke. ? Mental problems caused by tobacco may include difficulty sleeping and anxiety.  Need to use greater amounts of tobacco to get the same effect. This means you have developed a tolerance.  Withdrawal symptoms as a result of stopping or rapidly cutting back use. These symptoms may last a month or more after quitting and include the following: ? Depressed, anxious, or irritable mood. ? Difficulty concentrating. ? Increased appetite. ? Restlessness or trouble sleeping. ? Use of tobacco to avoid withdrawal symptoms.  How is this diagnosed? Tobacco use disorder is diagnosed by your health care provider. A diagnosis may be made by:  Your health care provider asking questions about your tobacco use and any problems it may be causing.  A physical exam.  Lab tests.  You may be referred to a mental health professional or addiction specialist.  The severity of tobacco use disorder depends on the number of signs and symptoms you have:  Mild--Two or three symptoms.  Moderate--Four or  five symptoms.  Severe--Six or more symptoms.  How is this treated? Many people with tobacco use disorder are unable to quit on their own and need help. Treatment options include the following:  Nicotine replacement therapy (NRT). NRT provides nicotine without the other harmful chemicals in tobacco. NRT gradually lowers the dosage of nicotine in the body and reduces withdrawal symptoms. NRT is available in over-the-counter forms (gum, lozenges, and skin patches) as well as prescription forms (mouth inhaler and nasal spray).  Medicines.This may include: ? Antidepressant medicine that may reduce nicotine cravings. ? A medicine that acts on nicotine receptors in the brain to reduce cravings and withdrawal symptoms. It may also block the effects of tobacco in people with TUD who relapse.  Counseling or talk therapy. A form of talk therapy called behavioral therapy is commonly used  to treat people with TUD. Behavioral therapy looks at triggers for tobacco use, how to avoid them, and how to cope with cravings. It is most effective in person or by phone but is also available in self-help forms (books and Internet websites).  Support groups. These provide emotional support, advice, and guidance for quitting tobacco.  The most effective treatment for TUD is usually a combination of medicine, talk therapy, and support groups. Follow these instructions at home:  Keep all follow-up visits as directed by your health care provider. This is important.  Take medicines only as directed by your health care provider.  Check with your health care provider before starting new prescription or over-the-counter medicines. Contact a health care provider if:  You are not able to take your medicines as prescribed.  Treatment is not helping your TUD and your symptoms get worse. Get help right away if:  You have serious thoughts about hurting yourself or others.  You have trouble breathing, chest pain, sudden  weakness, or sudden numbness in part of your body. This information is not intended to replace advice given to you by your health care provider. Make sure you discuss any questions you have with your health care provider. Document Released: 02/03/2004 Document Revised: 01/31/2016 Document Reviewed: 07/26/2013 Elsevier Interactive Patient Education  2018 [employer].    Steps to Quit Smoking Smoking tobacco can be harmful to your health and can affect almost every organ in your body. Smoking puts you, and those around you, at risk for developing many serious chronic diseases. Quitting smoking is difficult, but it is one of the best things that you can do for your health. It is never too late to quit. What are the benefits of quitting smoking? When you quit smoking, you lower your risk of developing serious diseases and conditions, such as:  Lung cancer or lung disease, such as COPD.  Heart disease.  Stroke.  Heart attack.  Infertility.  Osteoporosis and bone fractures.  Additionally, symptoms such as coughing, wheezing, and shortness of breath may get better when you quit. You may also find that you get sick less often because your body is stronger at fighting off colds and infections. If you are pregnant, quitting smoking can help to reduce your chances of having a baby of low birth weight. How do I get ready to quit? When you decide to quit smoking, create a plan to make sure that you are successful. Before you quit:  Pick a date to quit. Set a date within the next two weeks to give you time to prepare.  Write down the reasons why you are quitting. Keep this list in places where you will see it often, such as on your bathroom mirror or in your car or wallet.  Identify the people, places, things, and activities that make you want to smoke (triggers) and avoid them. Make sure to take these actions: ? Throw away all cigarettes at home, at work, and in your car. ? Throw away smoking  accessories, such as Scientist, research (medical). ? Clean your car and make sure to empty the ashtray. ? Clean your home, including curtains and carpets.  Tell your family, friends, and coworkers that you are quitting. Support from your loved ones can make quitting easier.  Talk with your health care provider about your options for quitting smoking.  Find out what treatment options are covered by your health insurance.  What strategies can I use to quit smoking? Talk with your healthcare  provider about different strategies to quit smoking. Some strategies include:  Quitting smoking altogether instead of gradually lessening how much you smoke over a period of time. Research shows that quitting cold [place] is more successful than gradually quitting.  Attending in-person counseling to help you build problem-solving skills. You are more likely to have success in quitting if you attend several counseling sessions. Even short sessions of 10 minutes can be effective.  Finding resources and support systems that can help you to quit smoking and remain smoke-free after you quit. These resources are most helpful when you use them often. They can include: ? Online chats with a Social worker. ? Telephone quitlines. ? Careers information officer. ? Support groups or group counseling. ? Text messaging programs. ? Mobile phone applications.  Taking medicines to help you quit smoking. (If you are pregnant or breastfeeding, talk with your health care provider first.) Some medicines contain nicotine and some do not. Both types of medicines help with cravings, but the medicines that include nicotine help to relieve withdrawal symptoms. Your health care provider may recommend: ? Nicotine patches, gum, or lozenges. ? Nicotine inhalers or sprays. ? Non-nicotine medicine that is taken by mouth.  Talk with your health care provider about combining strategies, such as taking medicines while you are also receiving  in-person counseling. Using these two strategies together makes you more likely to succeed in quitting than if you used either strategy on its own. If you are pregnant or breastfeeding, talk with your health care provider about finding counseling or other support strategies to quit smoking. Do not take medicine to help you quit smoking unless told to do so by your health care provider. What things can I do to make it easier to quit? Quitting smoking might feel overwhelming at first, but there is a lot that you can do to make it easier. Take these important actions:  Reach out to your family and friends and ask that they support and encourage you during this time. Call telephone quitlines, reach out to support groups, or work with a counselor for support.  Ask people who smoke to avoid smoking around you.  Avoid places that trigger you to smoke, such as bars, parties, or smoke-break areas at work.  Spend time around people who do not smoke.  Lessen stress in your life, because stress can be a smoking trigger for some people. To lessen stress, try: ? Exercising regularly. ? Deep-breathing exercises. ? Yoga. ? Meditating. ? Performing a body scan. This involves closing your eyes, scanning your body from head to toe, and noticing which parts of your body are particularly tense. Purposefully relax the muscles in those areas.  Download or purchase mobile phone or tablet apps (applications) that can help you stick to your quit plan by providing reminders, tips, and encouragement. There are many free apps, such as QuitGuide from the [employer] manager for Disease Control and Prevention). You can find other support for quitting smoking (smoking cessation) through smokefree.gov and other websites.  How will I feel when I quit smoking? Within the first 24 hours of quitting smoking, you may start to feel some withdrawal symptoms. These symptoms are usually most noticeable 2-3 days after quitting, but they  usually do not last beyond 2-3 weeks. Changes or symptoms that you might experience include:  Mood swings.  Restlessness, anxiety, or irritation.  Difficulty concentrating.  Dizziness.  Strong cravings for sugary foods in addition to nicotine.  Mild weight gain.  Constipation.  Nausea.  Coughing or a sore throat.  Changes in how your medicines work in your body.  A depressed mood.  Difficulty sleeping (insomnia).  After the first 2-3 weeks of quitting, you may start to notice more positive results, such as:  Improved sense of smell and taste.  Decreased coughing and sore throat.  Slower heart rate.  Lower blood pressure.  Clearer skin.  The ability to breathe more easily.  Fewer sick days.  Quitting smoking is very challenging for most people. Do not get discouraged if you are not successful the first time. Some people need to make many attempts to quit before they achieve long-term success. Do your best to stick to your quit plan, and talk with your health care provider if you have any questions or concerns. This information is not intended to replace advice given to you by your health care provider. Make sure you discuss any questions you have with your health care provider. Document Released: 05/24/2001 Document Revised: 01/26/2016 Document Reviewed: 10/14/2014 Elsevier Interactive Patient Education  2018 [place] with Quitting Smoking Quitting smoking is a physical and mental challenge. You will face cravings, withdrawal symptoms, and temptation. Before quitting, work with your health care provider to make a plan that can help you cope. Preparation can help you quit and keep you from giving in. How can I cope with cravings? Cravings usually last for 5-10 minutes. If you get through it, the craving will pass. Consider taking the following actions to help you cope with cravings:  Keep your mouth busy: ? Chew sugar-free gum. ? Suck on hard  candies or a straw. ? Brush your teeth.  Keep your hands and body busy: ? Immediately change to a different activity when you feel a craving. ? Squeeze or play with a ball. ? Do an activity or a hobby, like making bead jewelry, practicing needlepoint, or working with wood. ? Mix up your normal routine. ? Take a short exercise break. Go for a quick walk or run up and down stairs. ? Spend time in public places where smoking is not allowed.  Focus on doing something kind or helpful for someone else.  Call a friend or family member to talk during a craving.  Join a support group.  Call a quit line, such as 1-800-QUIT-NOW.  Talk with your health care provider about medicines that might help you cope with cravings and make quitting easier for you.  How can I deal with withdrawal symptoms? Your body may experience negative effects as it tries to get used to not having nicotine in the system. These effects are called withdrawal symptoms. They may include:  Feeling hungrier than normal.  Trouble concentrating.  Irritability.  Trouble sleeping.  Feeling depressed.  Restlessness and agitation.  Craving a cigarette.  To manage withdrawal symptoms:  Avoid places, people, and activities that trigger your cravings.  Remember why you want to quit.  Get plenty of sleep.  Avoid coffee and other caffeinated drinks. These may worsen some of your symptoms.  How can I handle social situations? Social situations can be difficult when you are quitting smoking, especially in the first few weeks. To manage this, you can:  Avoid parties, bars, and other social situations where people might be smoking.  Avoid alcohol.  Leave right away if you have the urge to smoke.  Explain to your family and friends that you are quitting smoking. Ask for understanding and support.  Plan activities with friends or family where  smoking is not an option.  What are some ways I can cope with  stress? Wanting to smoke may cause stress, and stress can make you want to smoke. Find ways to manage your stress. Relaxation techniques can help. For example:  Breathe slowly and deeply, in through your nose and out through your mouth.  Listen to soothing, relaxing music.  Talk with a family member or friend about your stress.  Light a candle.  Soak in a bath or take a shower.  Think about a peaceful place.  What are some ways I can prevent weight gain? Be aware that many people gain weight after they quit smoking. However, not everyone does. To keep from gaining weight, have a plan in place before you quit and stick to the plan after you quit. Your plan should include:  Having healthy snacks. When you have a craving, it may help to: ? Eat plain popcorn, crunchy carrots, celery, or other cut vegetables. ? Chew sugar-free gum.  Changing how you eat: ? Eat small portion sizes at meals. ? Eat 4-6 small meals throughout the day instead of 1-2 large meals a day. ? Be mindful when you eat. Do not watch television or do other things that might distract you as you eat.  Exercising regularly: ? Make time to exercise each day. If you do not have time for a long workout, do short bouts of exercise for 5-10 minutes several times a day. ? Do some form of strengthening exercise, like weight lifting, and some form of aerobic exercise, like running or swimming.  Drinking plenty of water or other low-calorie or no-calorie drinks. Drink 6-8 glasses of water daily, or as much as instructed by your health care provider.  Summary  Quitting smoking is a physical and mental challenge. You will face cravings, withdrawal symptoms, and temptation to smoke again. Preparation can help you as you go through these challenges.  You can cope with cravings by keeping your mouth busy (such as by chewing gum), keeping your body and hands busy, and making calls to family, friends, or a helpline for people who want  to quit smoking.  You can cope with withdrawal symptoms by avoiding places where people smoke, avoiding drinks with caffeine, and getting plenty of rest.  Ask your health care provider about the different ways to prevent weight gain, avoid stress, and handle social situations. This information is not intended to replace advice given to you by your health care provider. Make sure you discuss any questions you have with your health care provider. Document Released: 05/27/2016 Document Revised: 05/27/2016 Document Reviewed: 05/27/2016 Elsevier Interactive Patient Education  [employer].

## 2018-02-05 NOTE — Discharge Summary (Addendum)
Physician Discharge Summary  Todd [name] PJA:[id number] DOB: 1956/05/21 DOA: 02/03/2018  PCP: [name], MD  Admit date: 02/03/2018 Discharge date: 02/05/2018  Admitted From: HOME  Disposition: HOME   Recommendations for Outpatient Follow-up:  1. Follow up with PCP in 3-5 days for recheck/hospital follow up 2. Follow up with endocrinologist in 2 weeks 3. Please obtain BMP/CBC in one week 4. Please adjust and titrate insulin doses as needed for better glycemic control  Discharge Condition: STABLE   CODE STATUS: FULL    Brief Hospitalization Summary: Please see all hospital notes, images, labs for full details of the hospitalization.  HPI: Todd [name] is a 62 y.o. male with known diabetes mellitus of uncertain type, had been insulin requiring, hypertension, hyperlipidemia, peripheral vascular disease and neuropathy status post bilateral AKA and hypothyroidism presented to the emergency room by EMS after being notified by his primary care physician that he needed to come to the ED because his blood glucose was greater than 1000.  He was seen by his PCP yesterday and had labs done.  His hemoglobin A1c is greater than 14%.  The patient says that he had stopped taking all of his medications because he felt better.  He was also noted to be intoxicated with alcohol.  He is reporting today that he had been started on treatment for diabetes approximately 2 years ago.  According to chart review his hemoglobin A1c had been 5.5% up until it was tested yesterday and found to be greater than 14%.  The patient denies any other recreational drug use.  He denies cough, chest pain, shortness of breath, fever and chills.  He otherwise had not been having many complaints.  There is some notation in the chart that there was concern that he was not taking his home medications and his PCP was investigating this.  ED course: The patient was noted to be intoxicated with alcohol.  His serum alcohol level was  elevated.  His blood glucose is greater than 900.  His anion gap was 22.  He had an elevated white blood cell count.  He was started on IV fluids and IV insulin and hospitalization was requested.  Brief Admission Hx: 61 y.o.malewith known diabetes mellitus of uncertain type, had been insulin requiring, hypertension, hyperlipidemia, peripheral vascular disease and neuropathy status post bilateral AKA and hypothyroidism presented to the emergency room by EMS after being notified by his primary care physician that he needed to come to the ED because his blood glucose was greater than 1000.   MDM/Assessment & Plan:   1. Diabetic ketoacidosis - The patient has been transitioned off the IV insulin infusion and now on basal bolus supplemental insulin coverage.  Transferred out of SDU to med surg.   2. Uncontrolled diabetes mellitus type 1.5 - He will need indefinite insulin therapy as evidenced by an A1c>14%.  He had stopped taking his insulin shots at home.  He needs intensive diabetes education which can be done outpatient.  I have consulted diabetes coordinator team.  I plan to discharge him on novolog 70/30 insulin 2 injections per day as I don't think that he will be able to manage 4 injections per day at home.  Novolog 70/30 - start 18 units BID with meals.  Recommended patient to establish care with endocrinologist for further assistance with diabetes management.   3. Hypokalemia - IV and oral replacement.  Will discharge him on 5 days of kdur 10 meq daily.   4. Alcohol intoxication- He was treated  with supportive care.   He was placed on a CIWA protocol to monitor for alcohol withdrawal.  5. Leukocytosis-No sign of infection found.  6. Severe intertrigo rash - fungal rash being treated with oral fluconazole and topical ketoconazole creme. 7. Tobacco abuse- strongly advised to discontinue all tobacco use. The patient verbalizes understanding. 8. Reactive thrombocytosis- Follow up with PCP.   9. Constipation - continue laxatives.   10. Acute kidney injury on CKD stage III-Improved. Treated with IV hydration, Avoid nephrotoxins. 11. Hypothyroidism- did not check TSH in setting of acute critical illness, follow-up outpatient with PCP and endocrinologist.  12. Essential hypertension- resumed home medications.    DVT Prophylaxis:Lovenox Code Status:Full Family Communication:Patient Disposition Plan:Home  Consultants:  Diabetes coordinator  Discharge Diagnoses:  Principal Problem:   DKA (diabetic ketoacidoses) ([place]) Active Problems:   HTN (hypertension)   Hypothyroidism   Diabetes type 2, uncontrolled (H[facility])   Hyperuricemia   ARF (acute renal failure) (HCC)   Diabetic polyneuropathy associated with type 2 diabetes mellitus ([place])   History of right above knee amputation (HCC)   Urinary incontinence   Above knee amputation of left lower extremity (HCC)   Alcohol intoxication ([place])   Tobacco abuse   Hyperglycemia   Leukocytosis   Reactive thrombocytosis  Discharge Instructions: Discharge Instructions    Call MD for:  difficulty breathing, headache or visual disturbances   Complete by:  As directed    Call MD for:  extreme fatigue   Complete by:  As directed    Call MD for:  hives   Complete by:  As directed    Call MD for:  persistant dizziness or light-headedness   Complete by:  As directed    Call MD for:  persistant nausea and vomiting   Complete by:  As directed    Increase activity slowly   Complete by:  As directed      Allergies as of 02/05/2018      Reactions   Lisinopril Other (See Comments)   Hyperkalemia / Renal failure      Medication List    STOP taking these medications   feeding supplement (GLUCERNA SHAKE) Liqd   Insulin Glargine 100 UNIT/ML Solostar Pen Commonly known as:  LANTUS   megestrol 400 MG/10ML suspension Commonly known as:  MEGACE   protein supplement shake Liqd Commonly known as:  PREMIER PROTEIN     TAKE  these medications   allopurinol 100 MG tablet Commonly known as:  ZYLOPRIM Take 1 tablet (100 mg total) by mouth daily.   ASPIRIN 81 81 MG EC tablet Generic drug:  aspirin Take 1 tablet by mouth daily.   atorvastatin 40 MG tablet Commonly known as:  LIPITOR Take 1 tablet (40 mg total) by mouth daily at 6 PM.   blood glucose meter kit and supplies Dispense based on patient and insurance preference. Use up to four times daily as directed. (FOR ICD-10 E10.9, E11.9).   carvedilol 3.125 MG tablet Commonly known as:  COREG Take 1 tablet (3.125 mg total) by mouth 2 (two) times daily with a meal.   docusate sodium 100 MG capsule Commonly known as:  COLACE Take 1 capsule (100 mg total) by mouth 2 (two) times daily.   folic acid 1 MG tablet Commonly known as:  FOLVITE Take 1 tablet (1 mg total) by mouth daily. Start taking on:  02/06/2018   insulin aspart protamine - aspart (70-30) 100 UNIT/ML FlexPen Commonly known as:  NOVOLOG 70/30 MIX Inject 0.18 mLs (18  Units total) into the skin 2 (two) times daily with a meal.   Insulin Pen Needle 31G X 5 MM Misc 1 Device by Does not apply route as directed.   ketoconazole 2 % cream Commonly known as:  NIZORAL Apply topically 3 (three) times daily.   levothyroxine 75 MCG tablet Commonly known as:  SYNTHROID, LEVOTHROID Take 1 tablet (75 mcg total) by mouth daily before breakfast.   polyethylene glycol packet Commonly known as:  MIRALAX / GLYCOLAX Take 17 g by mouth daily as needed for mild constipation.   potassium chloride 10 MEQ tablet Commonly known as:  K-DUR Take 1 tablet (10 mEq total) by mouth daily for 5 days. Start taking on:  02/06/2018   thiamine 100 MG tablet Take 1 tablet (100 mg total) by mouth daily. Start taking on:  02/06/2018      Follow-up Information    [name], MD. Schedule an appointment as soon as possible for a visit on 02/09/2018.   Specialty:  Family Medicine Why:  Hospital Follow Up  11:10 Contact information: [place] [address] [phone]        Cassandria Anger, MD. Schedule an appointment as soon as possible for a visit on 02/20/2018.   Specialty:  Endocrinology Why:  Establish care for uncontrolled diabetes mellitus 2:00PM Contact information: [place] [address] [phone]          Allergies  Allergen Reactions  . Lisinopril Other (See Comments)    Hyperkalemia / Renal failure   Allergies as of 02/05/2018      Reactions   Lisinopril Other (See Comments)   Hyperkalemia / Renal failure      Medication List    STOP taking these medications   feeding supplement (GLUCERNA SHAKE) Liqd   Insulin Glargine 100 UNIT/ML Solostar Pen Commonly known as:  LANTUS   megestrol 400 MG/10ML suspension Commonly known as:  MEGACE   protein supplement shake Liqd Commonly known as:  PREMIER PROTEIN     TAKE these medications   allopurinol 100 MG tablet Commonly known as:  ZYLOPRIM Take 1 tablet (100 mg total) by mouth daily.   ASPIRIN 81 81 MG EC tablet Generic drug:  aspirin Take 1 tablet by mouth daily.   atorvastatin 40 MG tablet Commonly known as:  LIPITOR Take 1 tablet (40 mg total) by mouth daily at 6 PM.   blood glucose meter kit and supplies Dispense based on patient and insurance preference. Use up to four times daily as directed. (FOR ICD-10 E10.9, E11.9).   carvedilol 3.125 MG tablet Commonly known as:  COREG Take 1 tablet (3.125 mg total) by mouth 2 (two) times daily with a meal.   docusate sodium 100 MG capsule Commonly known as:  COLACE Take 1 capsule (100 mg total) by mouth 2 (two) times daily.   folic acid 1 MG tablet Commonly known as:  FOLVITE Take 1 tablet (1 mg total) by mouth daily. Start taking on:  02/06/2018   insulin aspart protamine - aspart (70-30) 100 UNIT/ML FlexPen Commonly known as:  NOVOLOG 70/30 MIX Inject 0.18 mLs (18 Units total) into the skin 2 (two) times  daily with a meal.   Insulin Pen Needle 31G X 5 MM Misc 1 Device by Does not apply route as directed.   ketoconazole 2 % cream Commonly known as:  NIZORAL Apply topically 3 (three) times daily.   levothyroxine 75 MCG tablet Commonly known as:  SYNTHROID, LEVOTHROID Take  1 tablet (75 mcg total) by mouth daily before breakfast.   polyethylene glycol packet Commonly known as:  MIRALAX / GLYCOLAX Take 17 g by mouth daily as needed for mild constipation.   potassium chloride 10 MEQ tablet Commonly known as:  K-DUR Take 1 tablet (10 mEq total) by mouth daily for 5 days. Start taking on:  02/06/2018   thiamine 100 MG tablet Take 1 tablet (100 mg total) by mouth daily. Start taking on:  02/06/2018       Procedures/Studies: Portable Chest X-ray (1 View)  Result Date: 02/04/2018 CLINICAL DATA:  Diabetic ketoacidosis EXAM: PORTABLE CHEST 1 VIEW COMPARISON:  08/31/2016 chest radiograph. FINDINGS: Surgical clips overlie medial lower neck bilaterally. Stable cardiomediastinal silhouette with normal heart size. No pneumothorax. No pleural effusion. No pulmonary edema. Curvilinear opacities at the right greater than left lung bases. IMPRESSION: Curvilinear bibasilar lung opacities, right greater than left, favor atelectasis. Electronically Signed   By: Ilona Sorrel M.D.   On: 02/04/2018 07:54     Subjective: Pt had ice cream overnight, he has no complaints.  He wants to go home.    Discharge Exam: Vitals:   02/05/18 0800 02/05/18 1200  BP: (!) 153/84 117/60  Pulse: 72 71  Resp: 18 18  Temp: 98.6 F (37 C) 98 F (36.7 C)  SpO2: 100% 100%   Vitals:   02/05/18 0422 02/05/18 0500 02/05/18 0800 02/05/18 1200  BP: (!) 145/76  (!) 153/84 117/60  Pulse: 71  72 71  Resp: _0 Temp: 98.5 F (36.9 C)  98.6 F (37 C) 98 F (36.7 C)  TempSrc: Oral  Oral Oral  SpO2: 99%  100% 100%  Weight:  63 kg    Height:       General: Pt is alert, awake, not in acute distress Cardiovascular:  RRR, S1/S2 +, no rubs, no gallops Respiratory: CTA bilaterally, no wheezing, no rhonchi Abdominal: Soft, NT, ND, bowel sounds + Extremities: no edema, no cyanosis   The results of significant diagnostics from this hospitalization (including imaging, microbiology, ancillary and laboratory) are listed below for reference.     Microbiology: Recent Results (from the past 240 hour(s))  MRSA PCR Screening     Status: None   Collection Time: 02/03/18 12:45 PM  Result Value Ref Range Status   MRSA by PCR NEGATIVE NEGATIVE Final    Comment:        The GeneXpert MRSA Assay (FDA approved for NASAL specimens only), is one component of a comprehensive MRSA colonization surveillance program. It is not intended to diagnose MRSA infection nor to guide or monitor treatment for MRSA infections. Performed at [facility], [address] [place], [place] [address]      Labs: BNP (last 3 results) No results for input(s): BNP in the last 8760 hours. Basic Metabolic Panel: Recent Labs  Lab 02/03/18 1024 02/03/18 1308 02/03/18 1720 02/04/18 0457 02/05/18 0839  NA 128* 134* 142 137 133*  K 4.0 3.2* 2.9* 3.6 3.0*  CL 89* 99 112* 109 105  CO2 17* 17* 22 18* 18*  GLUCOSE 954* 672* 110* 250* 456*  BUN _1 CREATININE 2.06* 1.85* 1.29* 1.38* 1.40*  CALCIUM 8.0* 7.9* 7.7* 7.6* 7.3*  MG  --   --   --  1.7  --    Liver Function Tests: Recent Labs  Lab 02/02/18 1149 02/03/18 1024 02/04/18 0457  AST 15 14* 27  ALT _2 ALKPHOS  156* 120 76  BILITOT 0.4 1.2 0.7  PROT 8.7* 7.8 6.4*  ALBUMIN 3.7 2.8* 2.2*   No results for input(s): LIPASE, AMYLASE in the last 168 hours. No results for input(s): AMMONIA in the last 168 hours. CBC: Recent Labs  Lab 02/02/18 1149 02/03/18 1024 02/04/18 0457  WBC 9.9 11.0* 12.0*  NEUTROABS 8.1* 8.5* 7.6  HGB 13.6 13.3 11.9*  HCT 44.4 40.5 35.6*  MCV 97 92.3 87.7  PLT 558* 482* 461*   Cardiac Enzymes: No results for input(s):  CKTOTAL, CKMB, CKMBINDEX, TROPONINI in the last 168 hours. BNP: Invalid input(s): POCBNP CBG: Recent Labs  Lab 02/04/18 1607 02/04/18 2134 02/05/18 0305 02/05/18 0740 02/05/18 1112  GLUCAP 212* 475* 489* 462* 286*   D-Dimer No results for input(s): DDIMER in the last 72 hours. Hgb A1c No results for input(s): HGBA1C in the last 72 hours. Lipid Profile No results for input(s): CHOL, HDL, LDLCALC, TRIG, CHOLHDL, LDLDIRECT in the last 72 hours. Thyroid function studies No results for input(s): TSH, T4TOTAL, T3FREE, THYROIDAB in the last 72 hours.  Invalid input(s): FREET3 Anemia work up No results for input(s): VITAMINB12, FOLATE, FERRITIN, TIBC, IRON, RETICCTPCT in the last 72 hours. Urinalysis    Component Value Date/Time   COLORURINE STRAW (A) 02/03/2018 1107   APPEARANCEUR CLEAR 02/03/2018 1107   APPEARANCEUR Clear 05/16/2017 1201   LABSPEC 1.023 02/03/2018 1107   PHURINE 6.0 02/03/2018 1107   GLUCOSEU >=500 (A) 02/03/2018 1107   HGBUR SMALL (A) 02/03/2018 1107   BILIRUBINUR NEGATIVE 02/03/2018 1107   BILIRUBINUR Negative 05/16/2017 1201   KETONESUR 20 (A) 02/03/2018 1107   PROTEINUR NEGATIVE 02/03/2018 1107   NITRITE NEGATIVE 02/03/2018 1107   LEUKOCYTESUR NEGATIVE 02/03/2018 1107   LEUKOCYTESUR Negative 05/16/2017 1201   Sepsis Labs Invalid input(s): PROCALCITONIN,  WBC,  LACTICIDVEN Microbiology Recent Results (from the past 240 hour(s))  MRSA PCR Screening     Status: None   Collection Time: 02/03/18 12:45 PM  Result Value Ref Range Status   MRSA by PCR NEGATIVE NEGATIVE Final    Comment:        The GeneXpert MRSA Assay (FDA approved for NASAL specimens only), is one component of a comprehensive MRSA colonization surveillance program. It is not intended to diagnose MRSA infection nor to guide or monitor treatment for MRSA infections. Performed at [facility], [address], [place], [place] [address]    Time coordinating discharge: 35  minutes  SIGNED:  Irwin Brakeman, MD  [facility] 02/05/2018, 2:55 PM Pager [phone]  If 7PM-7AM, please contact night-coverage [email or web address] Password [email or web address]

## 2018-02-05 NOTE — Plan of Care (Signed)
  RD consulted for nutrition education regarding diabetes. History of bilateral AKA.   Result of hemoglobin A1C >14%  per provider. Patient with uncontrolled diabetes mellitus type 1.5 and dependent on his mother for meal preparation and service. Patient allowed education but limited interaction. Reports 2 meals daily- a late breakfast and dinner in early evening. It is unknown the amount of alcohol he is drinking which is also impacting his diabetes management.   RD provided "Carbohydrate Counting for People with Diabetes" handout from the Academy of Nutrition and Dietetics. Discussed different food groups and their effects on blood sugar, emphasizing carbohydrate-containing foods. Provided list of carbohydrates and recommended serving sizes of common foods.  Discussed importance of controlled and consistent carbohydrate intake throughout the day. Provided examples of ways to balance meals/snacks and encouraged intake of high-fiber, whole grain complex carbohydrates. Teach back method used.  Expect fair compliance due to limited demonstration of motivation for change.  Body mass index is 21.12 kg/m. Pt meets criteria for normal based on current BMI. Patient reports usual body weight 145 lb -that is s/p is AKA's.  Current diet order is Heart Healthy/CHO mod, patient is consuming approximately 50% of meals at this time.   Labs and medications reviewed. BMP Latest Ref Rng & Units 02/05/2018 02/04/2018 02/03/2018  Glucose 70 - 99 mg/dL 456(H) 250(H) 110(H)  BUN 8 - 23 mg/dL 12 16 17   Creatinine 0.61 - 1.24 mg/dL 1.40(H) 1.38(H) 1.29(H)  BUN/Creat Ratio 10 - 24 - - -  Sodium 135 - 145 mmol/L 133(L) 137 142  Potassium 3.5 - 5.1 mmol/L 3.0(L) 3.6 2.9(L)  Chloride 98 - 111 mmol/L 105 109 112(H)  CO2 22 - 32 mmol/L 18(L) 18(L) 22  Calcium 8.9 - 10.3 mg/dL 7.3(L) 7.6(L) 7.7(L)    No further nutrition interventions warranted at this time. Patient set a goal of increasing non-starchy vegetables such as  cabbage (one of his favorite).  Recommend patient follow up with outpatient RD for additional support for education of pt and those shopping for food or preparing meals/snacks.    Colman Cater MS,RD,CSG,LDN Office: [phone] Pager: [phone]

## 2018-02-05 NOTE — Progress Notes (Signed)
PROGRESS NOTE   Todd [name]  BJS:[id number]  DOB: February 15, 1956  DOA: 02/03/2018 PCP: [name], MD   Brief Admission Hx: 62 y.o. male with known diabetes mellitus of uncertain type, had been insulin requiring, hypertension, hyperlipidemia, peripheral vascular disease and neuropathy status post bilateral AKA and hypothyroidism presented to the emergency room by EMS after being notified by his primary care physician that he needed to come to the ED because his blood glucose was greater than 1000.   MDM/Assessment & Plan:   1. Diabetic ketoacidosis - The patient has been transitioned off the IV insulin infusion and now on basal bolus supplemental insulin coverage.  Transfer out of SDU to med surg.   2. Uncontrolled diabetes mellitus type 1.5 - He will need indefinite insulin therapy as evidenced by an A1c>14%.  He had stopped taking his insulin shots at home.  He needs intensive diabetes education which can be done outpatient.  I have consulted diabetes coordinator team.  I plan to discharge him on novolog 70/30 insulin 2 injections per day as I don't think that he will be able to manage 4 injections per day at home.  Will try to lower his blood sugar this morning. If we are able to get it down reasonably, he could go home later today, otherwise will remain overnight.   3. Hypokalemia - IV and oral replacement ordered.  4. Alcohol intoxication- He was treated with supportive care.   He has been placed on a CIWA protocol to monitor for alcohol withdrawal.  Supplemental vitamin therapy ordered.  Follow. 5. Leukocytosis-No sign of infection found so far.  Investigating.  6. Severe intertrigo rash - fungal rash being treated with oral fluconazole and topical ketoconazole creme. 7. Tobacco abuse- strongly advised to discontinue all tobacco use.  The patient verbalizes understanding. 8. Reactive thrombocytosis- Following.  9. Constipation - peri-colace ordered.  Will add suppository.   10. Acute kidney injury on CKD stage III-Improved. Treating with IV hydration, Avoiding nephrotoxins. 11. Hypothyroidism- would not check TSH in setting of acute critical illness, follow-up outpatient with PCP. 12. Essential hypertension- monitor blood pressure and provide IV medications as needed.   DVT Prophylaxis: Lovenox Code Status: Full Family Communication: Patient Disposition Plan: Home tomorrow  Consultants:  Diabetes coordinator  Subjective: Pt says that he was given ice cream snacks and fruit juices last night, now his blood sugar is near 500.    Objective: Vitals:   02/04/18 2139 02/05/18 0026 02/05/18 0422 02/05/18 0500  BP: (!) 146/73 138/66 (!) 145/76   Pulse: 78 72 71   Resp: 18 18 16    Temp: 98.7 F (37.1 C) 98.6 F (37 C) 98.5 F (36.9 C)   TempSrc: Oral Oral Oral   SpO2: 99% 100% 99%   Weight:    63 kg  Height:        Intake/Output Summary (Last 24 hours) at 02/05/2018 1032 Last data filed at 02/05/2018 0500 Gross per 24 hour  Intake 960 ml  Output 2500 ml  Net -1540 ml   Filed Weights   02/03/18 1300 02/04/18 0500 02/05/18 0500  Weight: 60.3 kg 60.4 kg 63 kg   REVIEW OF SYSTEMS  As per history otherwise all reviewed and reported negative  Exam:  General exam: awake, alert, oriented, NAD.  Respiratory system: Clear. No increased work of breathing. Cardiovascular system: S1 & S2 heard, RRR. No JVD, murmurs, gallops, clicks or pedal edema. Gastrointestinal system: Abdomen is nondistended, soft and nontender. Normal bowel sounds heard. Central  nervous system: Alert and oriented. No focal neurological deficits. Extremities: diffuse intertriginous rash in groin area appears to be yeast.  bilateral AKA.  Data Reviewed: Basic Metabolic Panel: Recent Labs  Lab 02/03/18 1024 02/03/18 1308 02/03/18 1720 02/04/18 0457 02/05/18 0839  NA 128* 134* 142 137 133*  K 4.0 3.2* 2.9* 3.6 3.0*  CL 89* 99 112* 109 105  CO2 17* 17* 22 18* 18*   GLUCOSE 954* 672* 110* 250* 456*  BUN 23 21 17 16 12   CREATININE 2.06* 1.85* 1.29* 1.38* 1.40*  CALCIUM 8.0* 7.9* 7.7* 7.6* 7.3*  MG  --   --   --  1.7  --    Liver Function Tests: Recent Labs  Lab 02/02/18 1149 02/03/18 1024 02/04/18 0457  AST 15 14* 27  ALT 14 17 17   ALKPHOS 156* 120 76  BILITOT 0.4 1.2 0.7  PROT 8.7* 7.8 6.4*  ALBUMIN 3.7 2.8* 2.2*   No results for input(s): LIPASE, AMYLASE in the last 168 hours. No results for input(s): AMMONIA in the last 168 hours. CBC: Recent Labs  Lab 02/02/18 1149 02/03/18 1024 02/04/18 0457  WBC 9.9 11.0* 12.0*  NEUTROABS 8.1* 8.5* 7.6  HGB 13.6 13.3 11.9*  HCT 44.4 40.5 35.6*  MCV 97 92.3 87.7  PLT 558* 482* 461*   Cardiac Enzymes: No results for input(s): CKTOTAL, CKMB, CKMBINDEX, TROPONINI in the last 168 hours. CBG (last 3)  Recent Labs    02/04/18 2134 02/05/18 0305 02/05/18 0740  GLUCAP 475* 489* 462*   Recent Results (from the past 240 hour(s))  MRSA PCR Screening     Status: None   Collection Time: 02/03/18 12:45 PM  Result Value Ref Range Status   MRSA by PCR NEGATIVE NEGATIVE Final    Comment:        The GeneXpert MRSA Assay (FDA approved for NASAL specimens only), is one component of a comprehensive MRSA colonization surveillance program. It is not intended to diagnose MRSA infection nor to guide or monitor treatment for MRSA infections. Performed at [facility], [address], [place], [place] [address]     Studies: Portable Chest X-ray (1 View)  Result Date: 02/04/2018 CLINICAL DATA:  Diabetic ketoacidosis EXAM: PORTABLE CHEST 1 VIEW COMPARISON:  08/31/2016 chest radiograph. FINDINGS: Surgical clips overlie medial lower neck bilaterally. Stable cardiomediastinal silhouette with normal heart size. No pneumothorax. No pleural effusion. No pulmonary edema. Curvilinear opacities at the right greater than left lung bases. IMPRESSION: Curvilinear bibasilar lung opacities, right greater than left,  favor atelectasis. Electronically Signed   By: Ilona Sorrel M.D.   On: 02/04/2018 07:54    Scheduled Meds: . aspirin EC  81 mg Oral Daily  . atorvastatin  40 mg Oral q1800  . carvedilol  3.125 mg Oral BID WC  . enoxaparin (LOVENOX) injection  40 mg Subcutaneous Q24H  . folic acid  1 mg Intravenous Daily  . insulin aspart  0-15 Units Subcutaneous TID WC  . insulin aspart  0-5 Units Subcutaneous QHS  . insulin aspart  8 Units Subcutaneous TID WC  . insulin glargine  20 Units Subcutaneous Q24H  . ketoconazole   Topical TID  . potassium chloride  60 mEq Oral Once  . senna-docusate  2 tablet Oral BID  . thiamine injection  100 mg Intravenous Daily   Continuous Infusions: . 0.9 % NaCl with KCl 20 mEq / L 60 mL/hr at 02/05/18 0557    Principal Problem:   DKA (diabetic ketoacidoses) ([place]) Active Problems:  HTN (hypertension)   Hypothyroidism   Diabetes type 2, uncontrolled (HCC)   Hyperuricemia   ARF (acute renal failure) (HCC)   Diabetic polyneuropathy associated with type 2 diabetes mellitus ([place])   History of right above knee amputation ([place])   Urinary incontinence   Above knee amputation of left lower extremity ([place])   Alcohol intoxication ([place])   Tobacco abuse   Hyperglycemia   Leukocytosis   Reactive thrombocytosis  Time spent:   Irwin Brakeman, MD, FAAFP [facility] Pager [phone]  If 7PM-7AM, please contact night-coverage [email or web address] Password [email or web address] 02/05/2018, 10:32 AM    LOS: 2 days

## 2018-02-05 NOTE — Progress Notes (Signed)
Pt discharged in stable condition via wheelchair into the care of his mother and girlfriend "Ivin Booty" via private vehicle.  PIV (x2) removed intact w/o S&S of complications.  Discharge instructions provided to pt/girlfriend "Ivin Booty" and also reviewed discharge instructions via telephone with sister "[name]".  Pt/Sharon/[name] verbalized understanding.  Educated pt/Sharon on the need to stop wearing pull-ups 24/7 in an effort to allow healing of moisture associated damage from incontinence.  Encouraged pt to use the urinal.  Instructed pt/Sharon to continue to apply prescription cream to the buttocks and perineal area.  Reiterated the importance of checking blood sugar 4 x daily and monitoring his diet.  Pt verbalized understanding.  Prescription given to pt to purchase blood sugar monitoring meter and supplies.

## 2018-02-05 NOTE — Care Management Note (Signed)
Case Management Note  Patient Details  Name: Kaedin Hicklin MRN: [id number] Date of Birth: 26-Jan-1956  Subjective/Objective:   Admitted with DKA. CM consult for [facility] needs. Pt from home, ind with ADL's. Has insurance with drug coverage and PCP. Pt non-compliant with medications. Pt has strong family support. HH needs discussed with MD and he is in agreement pt has no HH needs currently.  Action/Plan: DC home with self care. CM will remove consult. May be re-consulted if needs arise.   Expected Discharge Date:   02/05/18               Expected Discharge Plan:  Home/Self Care  In-House Referral:  NA  Discharge planning Services  CM Consult  Post Acute Care Choice:  NA Choice offered to:  NA  Status of Service:  Completed, signed off   Sherald Barge, RN 02/05/2018, 10:28 AM

## 2018-02-09 ENCOUNTER — Ambulatory Visit (INDEPENDENT_AMBULATORY_CARE_PROVIDER_SITE_OTHER): Payer: Medicaid Other | Admitting: Family Medicine

## 2018-02-09 ENCOUNTER — Encounter: Payer: Self-pay | Admitting: Family Medicine

## 2018-02-09 VITALS — BP 134/73 | HR 80 | Temp 97.9°F

## 2018-02-09 DIAGNOSIS — E1165 Type 2 diabetes mellitus with hyperglycemia: Secondary | ICD-10-CM

## 2018-02-09 DIAGNOSIS — E1142 Type 2 diabetes mellitus with diabetic polyneuropathy: Secondary | ICD-10-CM | POA: Diagnosis not present

## 2018-02-09 DIAGNOSIS — Z794 Long term (current) use of insulin: Secondary | ICD-10-CM

## 2018-02-09 DIAGNOSIS — D473 Essential (hemorrhagic) thrombocythemia: Secondary | ICD-10-CM | POA: Diagnosis not present

## 2018-02-09 DIAGNOSIS — N289 Disorder of kidney and ureter, unspecified: Secondary | ICD-10-CM

## 2018-02-09 DIAGNOSIS — D75839 Thrombocytosis, unspecified: Secondary | ICD-10-CM

## 2018-02-09 LAB — URINALYSIS
BILIRUBIN UA: NEGATIVE
LEUKOCYTES UA: NEGATIVE
NITRITE UA: NEGATIVE
SPEC GRAV UA: 1.01 (ref 1.005–1.030)
UUROB: 0.2 mg/dL (ref 0.2–1.0)
pH, UA: 7 (ref 5.0–7.5)

## 2018-02-09 LAB — GLUCOSE HEMOCUE WAIVED: Glu Hemocue Waived: >444 mg/dL (ref 65–99)

## 2018-02-09 MED ORDER — INSULIN ASPART PROT & ASPART (70-30 MIX) 100 UNIT/ML PEN: 25.0000 [IU] | PEN_INJECTOR | Freq: Two times a day (BID) | SUBCUTANEOUS | 0 refills | Status: DC

## 2018-02-09 MED ORDER — INSULIN LISPRO 100 UNIT/ML ~~LOC~~ SOLN
10.0000 [IU] | Freq: Once | SUBCUTANEOUS | Status: AC
  Administered 2018-02-09: 10 [IU] via SUBCUTANEOUS

## 2018-02-09 NOTE — Progress Notes (Signed)
Subjective:  Patient ID: Todd [name], male    DOB: 22-Feb-1956  Age: 63 y.o. MRN: [id number]  CC: Hospitalization Follow-up ([place]- 8/24-8/26/19.dx DKA )   HPI Todd [name] presents for recheck of glucose. Registers high every time he takes it at home. Claims to be taking all of his medications including insulin. Denies fever, chills, sweats. Denies sx of polyuria, polydipsia and excessive hunger. Evasive r.e. Diet. Here with  His uncle who provides some history. Although weak, he feels he has improved since hospitalization.  Depression screen [facility] 2/9 02/09/2018 08/15/2017 05/16/2017  Decreased Interest 0 0 0  Down, Depressed, Hopeless 0 0 0  PHQ - 2 Score 0 0 0    History Todd [name] has a past medical history of AKI (acute kidney injury) ([place]), Constipated, Diabetes mellitus without complication ([place]), Diarrhea, Elevated LFTs, Goiter, Gout, Hyperlipidemia, Hypertension, Leukocytosis, Reactive thrombocytosis, Right BKA infection ([place]) (08/2016), Right leg pain, Sepsis due to undetermined organism [facility]), Thyroid disease, and Wound infection after surgery (08/2016).   He has a past surgical history that includes Thyroid surgery; Lower Extremity Angiography (Bilateral, 08/11/2016); ABDOMINAL AORTOGRAM (N/A, 08/11/2016); PERIPHERAL VASCULAR BALLOON ANGIOPLASTY (Left, 08/11/2016); ABDOMINAL AORTOGRAM W/LOWER EXTREMITY (N/A, 08/15/2016); Amputation (Right, 08/17/2016); Amputation (Right, 09/12/2016); Application if wound vac (Right, 09/12/2016); Amputation (Left, 08/12/2016); and Amputation (Left, 11/01/2017).   His family history includes Diabetes in his maternal aunt and maternal uncle; Heart disease in his mother; Pneumonia in his father.He reports that he has been smoking cigarettes. He has a 33.75 pack-year smoking history. He has never used smokeless tobacco. He reports that he drinks alcohol. He reports that he does not use drugs.    ROS Review of Systems  Constitutional: Positive for fatigue.    HENT: Negative.   Eyes: Negative for visual disturbance.  Respiratory: Negative for cough and shortness of breath.   Cardiovascular: Negative for chest pain and leg swelling.  Gastrointestinal: Negative for abdominal pain, diarrhea, nausea and vomiting.  Endocrine: Negative for polydipsia, polyphagia and polyuria.  Genitourinary: Negative for difficulty urinating.  Musculoskeletal: Negative for arthralgias and myalgias.  Skin: Negative for rash.  Neurological: Negative for headaches.  Psychiatric/Behavioral: Negative for sleep disturbance.    Objective:  BP 134/73 (BP Location: Left Arm)   Pulse 80   Temp 97.9 F (36.6 C) (Oral)   BP Readings from Last 3 Encounters:  02/16/18 109/77  02/09/18 134/73  02/05/18 117/60    Wt Readings from Last 3 Encounters:  02/15/18 134 lb 14.7 oz (61.2 kg)  02/05/18 138 lb 14.2 oz (63 kg)  11/30/17 145 lb (65.8 kg)     Physical Exam  Constitutional: He is oriented to person, place, and time. He appears well-developed and well-nourished. No distress.  HENT:  Head: Normocephalic and atraumatic.  Right Ear: External ear normal.  Left Ear: External ear normal.  Nose: Nose normal.  Mouth/Throat: Oropharynx is clear and moist.  Eyes: Pupils are equal, round, and reactive to light. Conjunctivae and EOM are normal.  Neck: Normal range of motion. Neck supple.  Cardiovascular: Normal rate, regular rhythm and normal heart sounds.  No murmur heard. Pulmonary/Chest: Effort normal and breath sounds normal. No respiratory distress. He has no wheezes. He has no rales.  Abdominal: Soft. There is no tenderness.  Musculoskeletal: Normal range of motion.  Neurological: He is alert and oriented to person, place, and time. He has normal reflexes.  Skin: Skin is warm and dry.  Psychiatric: He has a normal mood and affect. His behavior is normal.  Judgment and thought content normal.      Assessment & Plan:   Todd [name] was seen today for hospitalization  follow-up.  Diagnoses and all orders for this visit:  Type 2 diabetes mellitus with hyperglycemia, with long-term current use of insulin (HCC) -     CBC with Differential/Platelet -     CMP14+EGFR -     insulin lispro (HUMALOG) injection 10 Units -     Glucose Hemocue Waived -     Urinalysis  Diabetic polyneuropathy associated with type 2 diabetes mellitus (HCC) -     insulin lispro (HUMALOG) injection 10 Units  Function kidney decreased -     CBC with Differential/Platelet -     CMP14+EGFR -     insulin lispro (HUMALOG) injection 10 Units  Thrombocytosis (HCC) -     CBC with Differential/Platelet -     insulin lispro (HUMALOG) injection 10 Units  Other orders -     Discontinue: insulin aspart protamine - aspart (NOVOLOG MIX 70/30 FLEXPEN) (70-30) 100 UNIT/ML FlexPen; Inject 0.25 mLs (25 Units total) into the skin 2 (two) times daily with a meal.       I have discontinued Todd [name] aspirin, allopurinol, polyethylene glycol, docusate sodium, ketoconazole, and insulin aspart protamine - aspart. I am also having him maintain his levothyroxine, atorvastatin, carvedilol, Insulin Pen Needle, blood glucose meter kit and supplies, folic acid, and thiamine. We administered insulin lispro.  Allergies as of 02/09/2018      Reactions   Lisinopril Other (See Comments)   Hyperkalemia / Renal failure      Medication List        Accurate as of 02/09/18 11:59 PM. Always use your most recent med list.          atorvastatin 40 MG tablet Commonly known as:  LIPITOR Take 1 tablet (40 mg total) by mouth daily at 6 PM.   blood glucose meter kit and supplies Dispense based on patient and insurance preference. Use up to four times daily as directed. (FOR ICD-10 E10.9, E11.9).   carvedilol 3.125 MG tablet Commonly known as:  COREG Take 1 tablet (3.125 mg total) by mouth 2 (two) times daily with a meal.   folic acid 1 MG tablet Commonly known as:  FOLVITE Take 1 tablet (1 mg  total) by mouth daily.   insulin aspart protamine - aspart (70-30) 100 UNIT/ML FlexPen Commonly known as:  NOVOLOG 70/30 MIX Inject 0.25 mLs (25 Units total) into the skin 2 (two) times daily with a meal.   Insulin Pen Needle 31G X 5 MM Misc 1 Device by Does not apply route as directed.   levothyroxine 75 MCG tablet Commonly known as:  SYNTHROID, LEVOTHROID Take 1 tablet (75 mcg total) by mouth daily before breakfast.   potassium chloride 10 MEQ tablet Commonly known as:  K-DUR Take 1 tablet (10 mEq total) by mouth daily for 5 days.   thiamine 100 MG tablet Take 1 tablet (100 mg total) by mouth daily.      Pt. Is somewhat vague and evasive with his answers, but adamant that he is taking his meds. His uncle who is with him seems doubtful, but admits he only checks on him occasionally.  Patient lives alone.  We discussed the need for assistance.  I broached the topic to him of considering assisted living so that he could have help with his medication particularly his insulin and checking his blood sugar.  His sugar is high today in the  office in spite of instructions post hospitalization.  It was dangerously high just a week ago.  Patient and his uncle are not open to consideration of this option at this time.  Therefore the patient was discharged to home after being given 10 units of fast acting insulin with instruction to take his sugar before each meal and bedtime and contact the office if it is high.  His dose of 7030 was increased to 25 units twice daily.  He is to take this before breakfast and supper.  Follow-up: Return in about 1 week (around 02/16/2018), or if symptoms worsen or fail to improve, for diabetes.  [name], M.D.

## 2018-02-10 LAB — CBC WITH DIFFERENTIAL/PLATELET
BASOS ABS: 0.1 10*3/uL (ref 0.0–0.2)
Basos: 1 %
EOS (ABSOLUTE): 0.3 10*3/uL (ref 0.0–0.4)
Eos: 3 %
HEMOGLOBIN: 12.7 g/dL — AB (ref 13.0–17.7)
Hematocrit: 39.6 % (ref 37.5–51.0)
IMMATURE GRANS (ABS): 0 10*3/uL (ref 0.0–0.1)
Immature Granulocytes: 0 %
LYMPHS: 24 %
Lymphocytes Absolute: 2.3 10*3/uL (ref 0.7–3.1)
MCH: 29.1 pg (ref 26.6–33.0)
MCHC: 32.1 g/dL (ref 31.5–35.7)
MCV: 91 fL (ref 79–97)
Monocytes Absolute: 0.4 10*3/uL (ref 0.1–0.9)
Monocytes: 5 %
NEUTROS PCT: 67 %
Neutrophils Absolute: 6.5 10*3/uL (ref 1.4–7.0)
PLATELETS: 500 10*3/uL — AB (ref 150–450)
RBC: 4.36 x10E6/uL (ref 4.14–5.80)
RDW: 14.8 % (ref 12.3–15.4)
WBC: 9.6 10*3/uL (ref 3.4–10.8)

## 2018-02-10 LAB — CMP14+EGFR
ALBUMIN: 3.3 g/dL — AB (ref 3.6–4.8)
ALT: 13 IU/L (ref 0–44)
AST: 17 IU/L (ref 0–40)
Albumin/Globulin Ratio: 0.8 — ABNORMAL LOW (ref 1.2–2.2)
Alkaline Phosphatase: 141 IU/L — ABNORMAL HIGH (ref 39–117)
BUN / CREAT RATIO: 7 — AB (ref 10–24)
BUN: 11 mg/dL (ref 8–27)
Bilirubin Total: 0.4 mg/dL (ref 0.0–1.2)
CO2: 20 mmol/L (ref 20–29)
CREATININE: 1.63 mg/dL — AB (ref 0.76–1.27)
Calcium: 7.3 mg/dL — ABNORMAL LOW (ref 8.6–10.2)
Chloride: 84 mmol/L — ABNORMAL LOW (ref 96–106)
GFR calc non Af Amer: 44 mL/min/{1.73_m2} — ABNORMAL LOW (ref >59)
GFR, EST AFRICAN AMERICAN: 51 mL/min/{1.73_m2} — AB (ref >59)
GLUCOSE: 870 mg/dL — AB (ref 65–99)
Globulin, Total: 4.4 g/dL (ref 1.5–4.5)
Potassium: 3.9 mmol/L (ref 3.5–5.2)
Sodium: 123 mmol/L — ABNORMAL LOW (ref 134–144)
TOTAL PROTEIN: 7.7 g/dL (ref 6.0–8.5)

## 2018-02-12 ENCOUNTER — Encounter (HOSPITAL_COMMUNITY): Payer: Self-pay | Admitting: Emergency Medicine

## 2018-02-12 ENCOUNTER — Inpatient Hospital Stay (HOSPITAL_COMMUNITY)
Admission: EM | Admit: 2018-02-12 | Discharge: 2018-02-16 | DRG: 638 | Disposition: A | Discharge status: Home / Self Care | Payer: Medicaid Other | Attending: Internal Medicine | Admitting: Internal Medicine

## 2018-02-12 ENCOUNTER — Other Ambulatory Visit: Payer: Self-pay

## 2018-02-12 DIAGNOSIS — N179 Acute kidney failure, unspecified: Secondary | ICD-10-CM | POA: Diagnosis present

## 2018-02-12 DIAGNOSIS — E876 Hypokalemia: Secondary | ICD-10-CM | POA: Diagnosis not present

## 2018-02-12 DIAGNOSIS — Z794 Long term (current) use of insulin: Secondary | ICD-10-CM

## 2018-02-12 DIAGNOSIS — N184 Chronic kidney disease, stage 4 (severe): Secondary | ICD-10-CM | POA: Diagnosis present

## 2018-02-12 DIAGNOSIS — F17200 Nicotine dependence, unspecified, uncomplicated: Secondary | ICD-10-CM | POA: Diagnosis present

## 2018-02-12 DIAGNOSIS — E871 Hypo-osmolality and hyponatremia: Secondary | ICD-10-CM | POA: Diagnosis present

## 2018-02-12 DIAGNOSIS — E039 Hypothyroidism, unspecified: Secondary | ICD-10-CM | POA: Diagnosis present

## 2018-02-12 DIAGNOSIS — E1165 Type 2 diabetes mellitus with hyperglycemia: Secondary | ICD-10-CM | POA: Diagnosis not present

## 2018-02-12 DIAGNOSIS — N183 Chronic kidney disease, stage 3 (moderate): Secondary | ICD-10-CM | POA: Diagnosis present

## 2018-02-12 DIAGNOSIS — E1142 Type 2 diabetes mellitus with diabetic polyneuropathy: Secondary | ICD-10-CM | POA: Diagnosis present

## 2018-02-12 DIAGNOSIS — R739 Hyperglycemia, unspecified: Secondary | ICD-10-CM | POA: Diagnosis present

## 2018-02-12 DIAGNOSIS — Z9119 Patient's noncompliance with other medical treatment and regimen: Secondary | ICD-10-CM

## 2018-02-12 DIAGNOSIS — E1122 Type 2 diabetes mellitus with diabetic chronic kidney disease: Secondary | ICD-10-CM | POA: Diagnosis present

## 2018-02-12 DIAGNOSIS — E1100 Type 2 diabetes mellitus with hyperosmolarity without nonketotic hyperglycemic-hyperosmolar coma (NKHHC): Principal | ICD-10-CM | POA: Diagnosis present

## 2018-02-12 DIAGNOSIS — Z888 Allergy status to other drugs, medicaments and biological substances status: Secondary | ICD-10-CM | POA: Diagnosis not present

## 2018-02-12 DIAGNOSIS — Z79899 Other long term (current) drug therapy: Secondary | ICD-10-CM

## 2018-02-12 DIAGNOSIS — Z720 Tobacco use: Secondary | ICD-10-CM | POA: Diagnosis not present

## 2018-02-12 DIAGNOSIS — Z79890 Hormone replacement therapy (postmenopausal): Secondary | ICD-10-CM

## 2018-02-12 DIAGNOSIS — N189 Chronic kidney disease, unspecified: Secondary | ICD-10-CM

## 2018-02-12 DIAGNOSIS — I10 Essential (primary) hypertension: Secondary | ICD-10-CM | POA: Diagnosis present

## 2018-02-12 DIAGNOSIS — Z89612 Acquired absence of left leg above knee: Secondary | ICD-10-CM

## 2018-02-12 DIAGNOSIS — R358 Other polyuria: Secondary | ICD-10-CM | POA: Diagnosis present

## 2018-02-12 DIAGNOSIS — Z9114 Patient's other noncompliance with medication regimen: Secondary | ICD-10-CM

## 2018-02-12 DIAGNOSIS — E1129 Type 2 diabetes mellitus with other diabetic kidney complication: Secondary | ICD-10-CM | POA: Diagnosis not present

## 2018-02-12 DIAGNOSIS — Z89611 Acquired absence of right leg above knee: Secondary | ICD-10-CM

## 2018-02-12 DIAGNOSIS — Z91199 Patient's noncompliance with other medical treatment and regimen due to unspecified reason: Secondary | ICD-10-CM

## 2018-02-12 LAB — BASIC METABOLIC PANEL
Anion gap: 14 (ref 5–15)
BUN: 20 mg/dL (ref 8–23)
CO2: 20 mmol/L — ABNORMAL LOW (ref 22–32)
CREATININE: 2 mg/dL — AB (ref 0.61–1.24)
Calcium: 7.6 mg/dL — ABNORMAL LOW (ref 8.9–10.3)
Chloride: 86 mmol/L — ABNORMAL LOW (ref 98–111)
GFR calc Af Amer: 39 mL/min — ABNORMAL LOW (ref >60)
GFR, EST NON AFRICAN AMERICAN: 34 mL/min — AB (ref >60)
GLUCOSE: 1040 mg/dL — AB (ref 70–99)
POTASSIUM: 4.3 mmol/L (ref 3.5–5.1)
Sodium: 120 mmol/L — ABNORMAL LOW (ref 135–145)

## 2018-02-12 LAB — ETHANOL: Alcohol, Ethyl (B): <10 mg/dL (ref <10)

## 2018-02-12 LAB — CBC
HEMATOCRIT: 42.5 % (ref 39.0–52.0)
Hemoglobin: 13.7 g/dL (ref 13.0–17.0)
MCH: 30.3 pg (ref 26.0–34.0)
MCHC: 32.2 g/dL (ref 30.0–36.0)
MCV: 94 fL (ref 78.0–100.0)
PLATELETS: 509 10*3/uL — AB (ref 150–400)
RBC: 4.52 MIL/uL (ref 4.22–5.81)
RDW: 14.6 % (ref 11.5–15.5)
WBC: 10.5 10*3/uL (ref 4.0–10.5)

## 2018-02-12 LAB — URINALYSIS, ROUTINE W REFLEX MICROSCOPIC
BILIRUBIN URINE: NEGATIVE
Bacteria, UA: NONE SEEN
HGB URINE DIPSTICK: NEGATIVE
Ketones, ur: 5 mg/dL — AB
Leukocytes, UA: NEGATIVE
Nitrite: NEGATIVE
PH: 6 (ref 5.0–8.0)
Protein, ur: NEGATIVE mg/dL
SPECIFIC GRAVITY, URINE: 1.026 (ref 1.005–1.030)

## 2018-02-12 LAB — CBG MONITORING, ED

## 2018-02-12 LAB — RAPID URINE DRUG SCREEN, HOSP PERFORMED
AMPHETAMINES: NOT DETECTED
BARBITURATES: NOT DETECTED
BENZODIAZEPINES: NOT DETECTED
COCAINE: NOT DETECTED
OPIATES: NOT DETECTED
TETRAHYDROCANNABINOL: NOT DETECTED

## 2018-02-12 LAB — GLUCOSE, CAPILLARY

## 2018-02-12 MED ORDER — DEXTROSE-NACL 5-0.45 % IV SOLN: INTRAVENOUS | Status: DC

## 2018-02-12 MED ORDER — FOLIC ACID 1 MG PO TABS
1.0000 mg | ORAL_TABLET | Freq: Every day | ORAL | Status: DC
  Administered 2018-02-13 – 2018-02-16 (×4): 1 mg via ORAL
  Filled 2018-02-12 (×4): qty 1

## 2018-02-12 MED ORDER — VITAMIN B-1 100 MG PO TABS
100.0000 mg | ORAL_TABLET | Freq: Every day | ORAL | Status: DC
  Administered 2018-02-13 – 2018-02-16 (×4): 100 mg via ORAL
  Filled 2018-02-12 (×4): qty 1

## 2018-02-12 MED ORDER — DEXTROSE-NACL 5-0.45 % IV SOLN
INTRAVENOUS | Status: DC
  Administered 2018-02-13: 02:00:00 via INTRAVENOUS

## 2018-02-12 MED ORDER — SODIUM CHLORIDE 0.9 % IV SOLN
INTRAVENOUS | Status: DC
  Administered 2018-02-12: 5.4 [IU]/h via INTRAVENOUS
  Filled 2018-02-12 (×2): qty 1

## 2018-02-12 MED ORDER — ONDANSETRON HCL 4 MG PO TABS: 4.0000 mg | ORAL_TABLET | Freq: Four times a day (QID) | ORAL | Status: DC | PRN

## 2018-02-12 MED ORDER — ENOXAPARIN SODIUM 40 MG/0.4ML ~~LOC~~ SOLN
40.0000 mg | SUBCUTANEOUS | Status: DC
  Administered 2018-02-12 – 2018-02-15 (×4): 40 mg via SUBCUTANEOUS
  Filled 2018-02-12 (×4): qty 0.4

## 2018-02-12 MED ORDER — LEVOTHYROXINE SODIUM 75 MCG PO TABS
75.0000 ug | ORAL_TABLET | Freq: Every day | ORAL | Status: DC
  Administered 2018-02-13 – 2018-02-16 (×4): 75 ug via ORAL
  Filled 2018-02-12 (×4): qty 1

## 2018-02-12 MED ORDER — CARVEDILOL 3.125 MG PO TABS
3.1250 mg | ORAL_TABLET | Freq: Two times a day (BID) | ORAL | Status: DC
  Administered 2018-02-12 – 2018-02-13 (×3): 3.125 mg via ORAL
  Filled 2018-02-12 (×3): qty 1

## 2018-02-12 MED ORDER — ACETAMINOPHEN 650 MG RE SUPP: 650.0000 mg | Freq: Four times a day (QID) | RECTAL | Status: DC | PRN

## 2018-02-12 MED ORDER — INSULIN REGULAR BOLUS VIA INFUSION
0.0000 [IU] | Freq: Three times a day (TID) | INTRAVENOUS | Status: DC
  Filled 2018-02-12: qty 10

## 2018-02-12 MED ORDER — SODIUM CHLORIDE 0.45 % IV SOLN
INTRAVENOUS | Status: DC
  Administered 2018-02-12: 23:00:00 via INTRAVENOUS

## 2018-02-12 MED ORDER — SODIUM CHLORIDE 0.9 % IV BOLUS
3000.0000 mL | Freq: Once | INTRAVENOUS | Status: AC
  Administered 2018-02-12: 3000 mL via INTRAVENOUS

## 2018-02-12 MED ORDER — DEXTROSE 50 % IV SOLN: 25.0000 mL | INTRAVENOUS | Status: DC | PRN

## 2018-02-12 MED ORDER — ACETAMINOPHEN 325 MG PO TABS
650.0000 mg | ORAL_TABLET | Freq: Four times a day (QID) | ORAL | Status: DC | PRN
  Administered 2018-02-13: 650 mg via ORAL
  Filled 2018-02-12: qty 2

## 2018-02-12 MED ORDER — SODIUM CHLORIDE 0.9 % IV SOLN: INTRAVENOUS | Status: DC

## 2018-02-12 MED ORDER — ONDANSETRON HCL 4 MG/2ML IJ SOLN: 4.0000 mg | Freq: Four times a day (QID) | INTRAMUSCULAR | Status: DC | PRN

## 2018-02-12 NOTE — ED Provider Notes (Signed)
[facility] EMERGENCY DEPARTMENT Provider Note   CSN: [id number] Arrival date & time: 02/12/18  1738     History   Chief Complaint Chief Complaint  Patient presents with  . Hyperglycemia    HPI Todd [name] is a 62 y.o. male.  HPI   Patient presents from home, EMS for evaluation of hyperglycemia.  He states he is taking his medications as directed, and trying to eat a healthy diet.  Today he had greens and chicken.  He denies nausea, vomiting, cough, shortness of breath, chest pain, weakness or dizziness.  He was recently admitted to the hospital for a few days for DKA, noted at that time to have medication noncompliance.  He saw his doctor several days ago at that time his labs were drawn, but do not appear to have been acted upon yet.  At that time his glucose was 870, and his anion gap was 20.  There are no other known modifying factors.  Past Medical History:  Diagnosis Date  . AKI (acute kidney injury) ([place])   . Constipated   . Diabetes mellitus without complication ([place])   . Diarrhea   . Elevated LFTs   . Goiter   . Gout   . Hyperlipidemia   . Hypertension   . Leukocytosis   . Reactive thrombocytosis   . Right BKA infection ([place]) 08/2016  . Right leg pain   . Sepsis due to undetermined organism ([place])   . Thyroid disease   . Wound infection after surgery 08/2016    Patient Active Problem List   Diagnosis Date Noted  . Diabetic hyperosmolar non-ketotic state ([place]) 02/12/2018  . DKA (diabetic ketoacidoses) ([place]) 02/03/2018  . Alcohol intoxication ([place]) 02/03/2018  . Tobacco abuse 02/03/2018  . Hyperglycemia 02/03/2018  . Leukocytosis 02/03/2018  . Reactive thrombocytosis 02/03/2018  . Above knee amputation of left lower extremity ([place]) 11/16/2017  . Dehiscence of amputation stump ([place])   . Cellulitis 10/25/2017  . Urinary incontinence 08/14/2017  . Skin ulcer ([place]) 07/28/2017  . History of right above knee amputation ([place]) 09/21/2016  . Transaminitis     . Diabetic polyneuropathy associated with type 2 diabetes mellitus ([place]) 07/21/2016  . ARF (acute renal failure) ([place]) 07/04/2016  . Hyperuricemia 06/21/2016  . HTN (hypertension) 01/05/2016  . Diabetes type 2, uncontrolled ([place]) 01/05/2016  . Hypothyroidism 05/10/2011    Past Surgical History:  Procedure Laterality Date  . ABDOMINAL AORTOGRAM N/A 08/11/2016   Procedure: Abdominal Aortogram;  Surgeon: Waynetta Sandy, MD;  Location: [place] [facility];  Service: Cardiovascular;  Laterality: N/A;  . ABDOMINAL AORTOGRAM W/LOWER EXTREMITY N/A 08/15/2016   Procedure: Abdominal Aortogram w/Lower Extremity;  Surgeon: Elam Dutch, MD;  Location: [place] [facility];  Service: Cardiovascular;  Laterality: N/A;  . AMPUTATION Right 08/17/2016   Procedure: RIGHT BELOW KNEE AMPUTATION;  Surgeon: Elam Dutch, MD;  Location: [place];  Service: Vascular;  Laterality: Right;  . AMPUTATION Right 09/12/2016   Procedure: AMPUTATION ABOVE KNEE;  Surgeon: Newt Minion, MD;  Location: [place];  Service: Orthopedics;  Laterality: Right;  . AMPUTATION Left 08/12/2016   Procedure: LEFT BELOW KNEE AMPUTATION;  Surgeon: Newt Minion, MD;  Location: [place];  Service: Orthopedics;  Laterality: Left;  . AMPUTATION Left 11/01/2017   Procedure: LEFT ABOVE KNEE AMPUTATION;  Surgeon: Newt Minion, MD;  Location: [place];  Service: Orthopedics;  Laterality: Left;  . APPLICATION OF WOUND VAC Right 09/12/2016   Procedure: APPLICATION OF WOUND  VAC ABOVE KNEE;  Surgeon: Newt Minion, MD;  Location: [place];  Service: Orthopedics;  Laterality: Right;  . LOWER EXTREMITY ANGIOGRAPHY Bilateral 08/11/2016   Procedure: Lower Extremity Angiography;  Surgeon: Waynetta Sandy, MD;  Location: [place] [facility];  Service: Cardiovascular;  Laterality: Bilateral;  . PERIPHERAL VASCULAR BALLOON ANGIOPLASTY Left 08/11/2016   Procedure: Peripheral Vascular Balloon Angioplasty;  Surgeon: Waynetta Sandy, MD;  Location: [place] [facility];  Service: Cardiovascular;  Laterality: Left;  SFA  . THYROID SURGERY          Home Medications    Prior to Admission medications   Medication Sig Start Date End Date Taking? Authorizing Provider  atorvastatin (LIPITOR) 40 MG tablet Take 1 tablet (40 mg total) by mouth daily at 6 PM. 02/05/18 03/07/18 Yes Johnson, Clanford L, MD  carvedilol (COREG) 3.125 MG tablet Take 1 tablet (3.125 mg total) by mouth 2 (two) times daily with a meal. 02/05/18 03/07/18 Yes Johnson, Clanford L, MD  folic acid (FOLVITE) 1 MG tablet Take 1 tablet (1 mg total) by mouth daily. 02/06/18  Yes Johnson, Clanford L, MD  insulin aspart protamine - aspart (NOVOLOG MIX 70/30 FLEXPEN) (70-30) 100 UNIT/ML FlexPen Inject 0.25 mLs (25 Units total) into the skin 2 (two) times daily with a meal. 02/09/18 03/11/18 Yes [name], [name], MD  levothyroxine (SYNTHROID, LEVOTHROID) 75 MCG tablet Take 1 tablet (75 mcg total) by mouth daily before breakfast. 02/02/18  Yes [name], MD  thiamine 100 MG tablet Take 1 tablet (100 mg total) by mouth daily. 02/06/18  Yes Johnson, Clanford L, MD  blood glucose meter kit and supplies Dispense based on patient and insurance preference. Use up to four times daily as directed. (FOR ICD-10 E10.9, E11.9). 02/05/18   Johnson, Clanford L, MD  Insulin Pen Needle 31G X 5 MM MISC 1 Device by Does not apply route as directed. 02/05/18   Johnson, Clanford L, MD  potassium chloride (K-DUR) 10 MEQ tablet Take 1 tablet (10 mEq total) by mouth daily for 5 days. 02/06/18 02/11/18  Murlean Iba, MD    Family History Family History  Problem Relation Age of Onset  . Heart disease Mother   . Pneumonia Father   . Diabetes Maternal Aunt   . Diabetes Maternal Uncle     Social History Social History   Tobacco Use  . Smoking status: Current Every Day Smoker    Packs/day: 0.75    Years: 45.00    Pack years: 33.75    Types: Cigarettes    Last attempt to quit: 11/12/2014    Years since  quitting: 3.2  . Smokeless tobacco: Never Used  Substance Use Topics  . Alcohol use: Yes    Alcohol/week: 0.0 standard drinks  . Drug use: No     Allergies   Lisinopril   Review of Systems Review of Systems  All other systems reviewed and are negative.    Physical Exam Updated Vital Signs BP (!) 151/91   Pulse 70   Temp 98.3 F (36.8 C) (Oral)   Resp 16   SpO2 100%   Physical Exam  Constitutional: He is oriented to person, place, and time. He appears well-developed and well-nourished.  HENT:  Head: Normocephalic and atraumatic.  Right Ear: External ear normal.  Left Ear: External ear normal.  Eyes: Pupils are equal, round, and reactive to light. Conjunctivae and EOM are normal.  Neck: Normal range of motion and phonation normal. Neck supple.  Cardiovascular:  Normal rate, regular rhythm and normal heart sounds.  Pulmonary/Chest: Effort normal and breath sounds normal. He exhibits no bony tenderness.  Abdominal: Soft. There is no tenderness.  Musculoskeletal:  Bilateral AKA, with normal stumps.  Neurological: He is alert and oriented to person, place, and time. No cranial nerve deficit or sensory deficit. He exhibits normal muscle tone. Coordination normal.  Dysarthria is present.  Patient is hard of hearing.  No ataxia.  Skin: Skin is warm, dry and intact.  Psychiatric: He has a normal mood and affect. His behavior is normal.  Nursing note and vitals reviewed.    ED Treatments / Results  Labs (all labs ordered are listed, but only abnormal results are displayed) Labs Reviewed  BASIC METABOLIC PANEL - Abnormal; Notable for the following components:      Result Value   Sodium 120 (*)    Chloride 86 (*)    CO2 20 (*)    Glucose, Bld 1,040 (*)    Creatinine, Ser 2.00 (*)    Calcium 7.6 (*)    GFR calc non Af Amer 34 (*)    GFR calc Af Amer 39 (*)    All other components within normal limits  CBC - Abnormal; Notable for the following components:   Platelets  509 (*)    All other components within normal limits  URINALYSIS, ROUTINE W REFLEX MICROSCOPIC - Abnormal; Notable for the following components:   Color, Urine COLORLESS (*)    Glucose, UA >=500 (*)    Ketones, ur 5 (*)    All other components within normal limits  CBG MONITORING, ED - Abnormal; Notable for the following components:   Glucose-Capillary >600 (*)    All other components within normal limits  CBG MONITORING, ED - Abnormal; Notable for the following components:   Glucose-Capillary >600 (*)    All other components within normal limits  RAPID URINE DRUG SCREEN, HOSP PERFORMED  ETHANOL  CBC WITH DIFFERENTIAL/PLATELET  BASIC METABOLIC PANEL  BASIC METABOLIC PANEL  BASIC METABOLIC PANEL  MAGNESIUM  HEPATIC FUNCTION PANEL  PHOSPHORUS    EKG None  Radiology No results found.  Procedures .Critical Care Performed by: Daleen Bo, MD Authorized by: Daleen Bo, MD   Critical care provider statement:    Critical care time (minutes):  35   Critical care start time:  02/12/2018 7:25 PM   Critical care end time:  02/12/2018 10:14 PM   Critical care time was exclusive of:  Separately billable procedures and treating other patients   Critical care was necessary to treat or prevent imminent or life-threatening deterioration of the following conditions:  Metabolic crisis   Critical care was time spent personally by me on the following activities:  Blood draw for specimens, development of treatment plan with patient or surrogate, discussions with consultants, evaluation of patient's response to treatment, examination of patient, obtaining history from patient or surrogate, ordering and performing treatments and interventions, ordering and review of laboratory studies, pulse oximetry, re-evaluation of patient's condition, review of old charts and ordering and review of radiographic studies   (including critical care time)  Medications Ordered in ED Medications  sodium  chloride 0.9 % bolus 3,000 mL (3,000 mLs Intravenous New Bag/Given 02/12/18 2131)  dextrose 5 %-0.45 % sodium chloride infusion (has no administration in time range)  insulin regular (NOVOLIN R,HUMULIN R) 100 Units in sodium chloride 0.9 % 100 mL (1 Units/mL) infusion (5.4 Units/hr Intravenous New Bag/Given 02/12/18 2208)  insulin regular bolus via infusion 0-10  Units (has no administration in time range)  dextrose 50 % solution 25 mL (has no administration in time range)  enoxaparin (LOVENOX) injection 40 mg (has no administration in time range)  ondansetron (ZOFRAN) tablet 4 mg (has no administration in time range)    Or  ondansetron (ZOFRAN) injection 4 mg (has no administration in time range)  acetaminophen (TYLENOL) tablet 650 mg (has no administration in time range)    Or  acetaminophen (TYLENOL) suppository 650 mg (has no administration in time range)  0.45 % sodium chloride infusion (has no administration in time range)  thiamine tablet 100 mg (has no administration in time range)  levothyroxine (SYNTHROID, LEVOTHROID) tablet 75 mcg (has no administration in time range)  folic acid (FOLVITE) tablet 1 mg (has no administration in time range)  carvedilol (COREG) tablet 3.125 mg (has no administration in time range)     Initial Impression / Assessment and Plan / ED Course  I have reviewed the triage vital signs and the nursing notes.  Pertinent labs & imaging results that were available during my care of the patient were reviewed by me and considered in my medical decision making (see chart for details).  Clinical Course as of Feb 12 2213  Mon Feb 12, 2018  2136 High  CBG monitoring, ED(!!) [EW]  8457 Basic metabolic panel(!!) [EW]  3344 Normal except sodium low, chloride low, CO2 low, glucose high, creatinine high, calcium low, GFR low  Basic metabolic panel(!!) [EW]  8301 Normal  Ethanol [EW]  2137 normal  CBC(!) [EW]  2138 Normal  Urine rapid drug screen (hosp performed) [EW]     Clinical Course User Index [EW] Daleen Bo, MD     Patient Vitals for the past 24 hrs:  BP Temp Temp src Pulse Resp SpO2  02/12/18 2100 (!) 151/91 - - - - -  02/12/18 2030 (!) 131/94 - - - - -  02/12/18 2000 119/89 - - - - -  02/12/18 1930 115/80 - - 70 - 100 %  02/12/18 1740 137/79 98.3 F (36.8 C) Oral 70 16 100 %      Medical Decision Making: Yes  CRITICAL CARE-yes Performed by: Daleen Bo   Nursing Notes Reviewed/ Care Coordinated Applicable Imaging Reviewed Interpretation of Laboratory Data incorporated into ED treatment   9:40 PM-Consult complete with hospitalist. Patient case explained and discussed.  He agrees to admit patient for further evaluation and treatment. Call ended at 9:45 PM  Plan: Admit   Final Clinical Impressions(s) / ED Diagnoses   Final diagnoses:  Hyperglycemia  Noncompliance with medication regimen    ED Discharge Orders    None       Daleen Bo, MD 02/14/18 [phone]

## 2018-02-12 NOTE — ED Notes (Signed)
Asked pt if he can give [place] a urine sample. Cannot use the restroom at this time, call light given and he states that he will call when he can

## 2018-02-12 NOTE — H&P (Signed)
History and Physical    Todd [name] TDD:[id number] DOB: [birth year] DOA: 02/12/2018  PCP: [name], MD   Patient coming from: Home.  I have personally briefly reviewed patient's old medical records in [place]  Chief Complaint: High blood sugar since yesterday.  HPI: Todd [name] is a 62 y.o. male with medical history significant of AKI, constipation, occasional diarrhea, history of elevated LFTs, goiter, hypothyroidism, gout, hyperlipidemia, hypertension, reactive thrombocytosis, history of sepsis of undetermined cause, history of wound infection after surgery, bilateral BKA in 2 different procedures, bilateral AKA also and 2 different operations year who is brought to the emergency department due to his CBG monitor reading high since yesterday.  He complains of polyuria, polydipsia, polyphagia and blurry vision.  He admitted to eating or drinking sugary things like ice cream and popsicles, which he was asking for while hours examining him.  He says that he has been compliant with his treatment regimen, has not missed medications, but his initial blood glucose in the ED was at 1040 mg/dL he denies fever, chills, sore throat, hemoptysis, productive cough, dyspnea, chest pain, palpitations, diaphoresis, PND, orthopnea, abdominal pain, nausea, emesis, diarrhea, melena or hematochezia.  Denies dysuria, frequency or hematuria.  ED Course: Initial vital signs temperature 98.3 F, pulse 70, respirations 16, blood pressure 137/79 and O2 sat 100% on room air.  Received 3000 mL's of NS bolus and was started on an insulin infusion.  His urinalysis showed mild ketonuria of 5 and glucosuria more than 500 mg/dL.  All other values were normal.  White count was 10.5, hemoglobin 13.7 g/dL and platelets 509.  BMP showed a sodium of 120 (corrected to hyperglycemia sodium is 143), potassium 4.3, chloride 86 and CO2 20 mmol/L.  His anion gap was 14.  BUN was 20, creatinine 2.0, phosphorus 2.7,  magnesium 1.7, calcium 7.6 and glucose of 1040 mg/dL.  His creatinine rose 0.6 mg/dL, when compared to results from a week ago.  Corrected to albumin calcium level is 8.5 mg/dL.  Review of Systems: As per HPI otherwise 10 point review of systems negative.   Past Medical History:  Diagnosis Date  . AKI (acute kidney injury) ([place])   . Constipated   . Diabetes mellitus without complication ([place])   . Diarrhea   . Elevated LFTs   . Goiter   . Gout   . Hyperlipidemia   . Hypertension   . Leukocytosis   . Reactive thrombocytosis   . Right BKA infection ([place]) 08/2016  . Right leg pain   . Sepsis due to undetermined organism ([place])   . Thyroid disease   . Wound infection after surgery 08/2016    Past Surgical History:  Procedure Laterality Date  . ABDOMINAL AORTOGRAM N/A 08/11/2016   Procedure: Abdominal Aortogram;  Surgeon: Waynetta Sandy, MD;  Location: [place] [facility];  Service: Cardiovascular;  Laterality: N/A;  . ABDOMINAL AORTOGRAM W/LOWER EXTREMITY N/A 08/15/2016   Procedure: Abdominal Aortogram w/Lower Extremity;  Surgeon: Elam Dutch, MD;  Location: [place] [facility];  Service: Cardiovascular;  Laterality: N/A;  . AMPUTATION Right 08/17/2016   Procedure: RIGHT BELOW KNEE AMPUTATION;  Surgeon: Elam Dutch, MD;  Location: [place];  Service: Vascular;  Laterality: Right;  . AMPUTATION Right 09/12/2016   Procedure: AMPUTATION ABOVE KNEE;  Surgeon: Newt Minion, MD;  Location: [place];  Service: Orthopedics;  Laterality: Right;  . AMPUTATION Left 08/12/2016   Procedure: LEFT BELOW KNEE AMPUTATION;  Surgeon: Newt Minion, MD;  Location: [place];  Service: Orthopedics;  Laterality: Left;  . AMPUTATION Left 11/01/2017   Procedure: LEFT ABOVE KNEE AMPUTATION;  Surgeon: Newt Minion, MD;  Location: [place];  Service: Orthopedics;  Laterality: Left;  . APPLICATION OF WOUND VAC Right 09/12/2016   Procedure: APPLICATION OF WOUND VAC ABOVE KNEE;  Surgeon: Newt Minion, MD;  Location:  [place];  Service: Orthopedics;  Laterality: Right;  . LOWER EXTREMITY ANGIOGRAPHY Bilateral 08/11/2016   Procedure: Lower Extremity Angiography;  Surgeon: Waynetta Sandy, MD;  Location: [place] [facility];  Service: Cardiovascular;  Laterality: Bilateral;  . PERIPHERAL VASCULAR BALLOON ANGIOPLASTY Left 08/11/2016   Procedure: Peripheral Vascular Balloon Angioplasty;  Surgeon: Waynetta Sandy, MD;  Location: [place] [facility];  Service: Cardiovascular;  Laterality: Left;  SFA  . THYROID SURGERY       reports that he has been smoking cigarettes. He has a 33.75 pack-year smoking history. He has never used smokeless tobacco. He reports that he drinks alcohol. He reports that he does not use drugs.  Allergies  Allergen Reactions  . Lisinopril Other (See Comments)    Hyperkalemia / Renal failure    Family History  Problem Relation Age of Onset  . Heart disease Mother   . Pneumonia Father   . Diabetes Maternal Aunt   . Diabetes Maternal Uncle     Prior to Admission medications   Medication Sig Start Date End Date Taking? Authorizing Provider  atorvastatin (LIPITOR) 40 MG tablet Take 1 tablet (40 mg total) by mouth daily at 6 PM. 02/05/18 03/07/18 Yes Johnson, Clanford L, MD  carvedilol (COREG) 3.125 MG tablet Take 1 tablet (3.125 mg total) by mouth 2 (two) times daily with a meal. 02/05/18 03/07/18 Yes Johnson, Clanford L, MD  folic acid (FOLVITE) 1 MG tablet Take 1 tablet (1 mg total) by mouth daily. 02/06/18  Yes Johnson, Clanford L, MD  insulin aspart protamine - aspart (NOVOLOG MIX 70/30 FLEXPEN) (70-30) 100 UNIT/ML FlexPen Inject 0.25 mLs (25 Units total) into the skin 2 (two) times daily with a meal. 02/09/18 03/11/18 Yes [name], [name], MD  levothyroxine (SYNTHROID, LEVOTHROID) 75 MCG tablet Take 1 tablet (75 mcg total) by mouth daily before breakfast. 02/02/18  Yes [name], MD  thiamine 100 MG tablet Take 1 tablet (100 mg total) by mouth daily. 02/06/18  Yes Johnson,  Clanford L, MD  blood glucose meter kit and supplies Dispense based on patient and insurance preference. Use up to four times daily as directed. (FOR ICD-10 E10.9, E11.9). 02/05/18   Johnson, Clanford L, MD  Insulin Pen Needle 31G X 5 MM MISC 1 Device by Does not apply route as directed. 02/05/18   Johnson, Clanford L, MD  potassium chloride (K-DUR) 10 MEQ tablet Take 1 tablet (10 mEq total) by mouth daily for 5 days. 02/06/18 02/11/18  Murlean Iba, MD    Physical Exam: Vitals:   02/12/18 2200 02/12/18 2215 02/12/18 2300 02/12/18 2326  BP:   (!) 143/79   Pulse: 70 (!) 59 77   Resp: 17 16 14    Temp:    98.2 F (36.8 C)  TempSrc:    Oral  SpO2: 100% 100% 100%   Weight:    57 kg    Constitutional: NAD, calm, comfortable Eyes: PERRL, lids and conjunctivae normal ENMT: Mucous membranes are mildly dry. Posterior pharynx clear of any exudate or lesions. Neck: normal, supple, no masses, no thyromegaly Respiratory: clear to auscultation bilaterally, no wheezing, no crackles.  Normal respiratory effort. No accessory muscle use.  Cardiovascular: Regular rate and rhythm, no murmurs / rubs / gallops. No extremity edema. 2+ pedal pulses. No carotid bruits.  Abdomen: Soft, no tenderness, no masses palpated. No hepatosplenomegaly. Bowel sounds positive.  Musculoskeletal: no clubbing / cyanosis.  Bilateral AKA of lower extremities. Good ROM of upper extremities, no contractures. Normal muscle tone.  Skin: Positive onychomycosis of toes. Neurologic: CN 2-12 grossly intact. Sensation intact, DTR normal. Strength 5/5 in all 4.  Psychiatric: Normal judgment and insight. Alert and oriented x 4. Normal mood.    Labs on Admission: I have personally reviewed following labs and imaging studies  CBC: Recent Labs  Lab 02/09/18 1222 02/12/18 1841  WBC 9.6 10.5  NEUTROABS 6.5  --   HGB 12.7* 13.7  HCT 39.6 42.5  MCV 91 94.0  PLT 500* 124*   Basic Metabolic Panel: Recent Labs  Lab 02/09/18 1222  02/12/18 1841  NA 123* 120*  K 3.9 4.3  CL 84* 86*  CO2 20 20*  GLUCOSE 870* 1,040*  BUN 11 20  CREATININE 1.63* 2.00*  CALCIUM 7.3* 7.6*   GFR: Estimated Creatinine Clearance: 30.9 mL/min (A) (by C-G formula based on SCr of 2 mg/dL (H)). Liver Function Tests: Recent Labs  Lab 02/09/18 1222  AST 17  ALT 13  ALKPHOS 141*  BILITOT 0.4  PROT 7.7  ALBUMIN 3.3*   No results for input(s): LIPASE, AMYLASE in the last 168 hours. No results for input(s): AMMONIA in the last 168 hours. Coagulation Profile: No results for input(s): INR, PROTIME in the last 168 hours. Cardiac Enzymes: No results for input(s): CKTOTAL, CKMB, CKMBINDEX, TROPONINI in the last 168 hours. BNP (last 3 results) No results for input(s): PROBNP in the last 8760 hours. HbA1C: No results for input(s): HGBA1C in the last 72 hours. CBG: Recent Labs  Lab 02/12/18 1909 02/12/18 2207 02/12/18 2303  GLUCAP >600* >600* >600*   Lipid Profile: No results for input(s): CHOL, HDL, LDLCALC, TRIG, CHOLHDL, LDLDIRECT in the last 72 hours. Thyroid Function Tests: No results for input(s): TSH, T4TOTAL, FREET4, T3FREE, THYROIDAB in the last 72 hours. Anemia Panel: No results for input(s): VITAMINB12, FOLATE, FERRITIN, TIBC, IRON, RETICCTPCT in the last 72 hours. Urine analysis:    Component Value Date/Time   COLORURINE COLORLESS (A) 02/12/2018 1744   APPEARANCEUR CLEAR 02/12/2018 1744   APPEARANCEUR Clear 02/09/2018 1352   LABSPEC 1.026 02/12/2018 1744   PHURINE 6.0 02/12/2018 1744   GLUCOSEU >=500 (A) 02/12/2018 1744   HGBUR NEGATIVE 02/12/2018 1744   BILIRUBINUR NEGATIVE 02/12/2018 1744   BILIRUBINUR Negative 02/09/2018 1352   KETONESUR 5 (A) 02/12/2018 1744   PROTEINUR NEGATIVE 02/12/2018 1744   NITRITE NEGATIVE 02/12/2018 1744   LEUKOCYTESUR NEGATIVE 02/12/2018 1744   LEUKOCYTESUR Negative 02/09/2018 1352    Radiological Exams on Admission: No results found.  EKG: Independently reviewed. Vent. rate  74 BPM PR interval 110 ms QRS duration 116 ms QT/QTc 436/483 ms P-R-T axes 98 7 217 Sinus rhythm with short PR with Premature atrial complexes ST & T wave abnormality, consider inferior ischemia ST & T wave abnormality, consider anterolateral ischemia Abnormal ECG Inferior and anterolateral changes are new when compared to December 2018.  Assessment/Plan Principal Problem:   Diabetic hyperosmolar non-ketotic state ([place]) Admit to stepdown/inpatient. Keep n.p.o. for now. Continue insulin infusion. Continue IV fluids. Follow-up CBG and electrolytes. Diabetes education before discharge.  Active Problems:   AKI (acute kidney injury) ([place]) Likely secondary to polyuria. Continue IV  hydration. Monitor intake and output. Follow-up renal function electrolytes.    Abnormal EKG Trend troponin level. Check echocardiogram.    HTN (hypertension) Continue carvedilol 3.125 mg p.o. daily. Monitor blood pressure and heart rate.    Hypothyroidism Continue levothyroxine 75 mcg p.o. daily. Check TSH as needed.    Diabetic polyneuropathy associated with type 2 diabetes mellitus ([facility]) No complaints at this time. Analgesics as needed.    Tobacco abuse Tobacco cessation information to be provided by the staff. Nicotine replacement therapy as needed.    S/P AKA (above knee amputation) bilateral ([facility]) Supportive care.    DVT prophylaxis: Lovenox SQ. Code Status: Full code. Family Communication:  Disposition Plan: Admit to stepdown for IV insulin infusion. Consults called:  Admission status: Inpatient/stepdown.   Reubin Milan MD [facility] Pager [phone].  If 7PM-7AM, please contact night-coverage [email or web address] Password [facility]  02/12/2018, 11:41 PM

## 2018-02-12 NOTE — ED Triage Notes (Signed)
Pt called ems for High bs since yesterday, fire dpt and ems reading high. Per ems mother stated pt was altered. Ems states pt responds appropriately with questions but slow. Pt bilateral amputee.

## 2018-02-12 NOTE — ED Notes (Signed)
CRITICAL VALUE ALERT  Critical Value:  Glucose 1040  Date & Time Notied:  02/12/2018  2024  Provider Notified: EDP Eulis Foster  Orders Received/Actions taken: No orders at this time

## 2018-02-13 ENCOUNTER — Telehealth: Payer: Self-pay | Admitting: Family Medicine

## 2018-02-13 ENCOUNTER — Inpatient Hospital Stay (HOSPITAL_COMMUNITY): Payer: Medicaid Other

## 2018-02-13 DIAGNOSIS — I10 Essential (primary) hypertension: Secondary | ICD-10-CM

## 2018-02-13 LAB — CBC WITH DIFFERENTIAL/PLATELET
BASOS ABS: 0.1 10*3/uL (ref 0.0–0.1)
Basophils Relative: 1 %
Eosinophils Absolute: 0.5 10*3/uL (ref 0.0–0.7)
Eosinophils Relative: 4 %
HEMATOCRIT: 33.7 % — AB (ref 39.0–52.0)
Hemoglobin: 11.5 g/dL — ABNORMAL LOW (ref 13.0–17.0)
LYMPHS PCT: 26 %
Lymphs Abs: 3.4 10*3/uL (ref 0.7–4.0)
MCH: 29.2 pg (ref 26.0–34.0)
MCHC: 34.1 g/dL (ref 30.0–36.0)
MCV: 85.5 fL (ref 78.0–100.0)
Monocytes Absolute: 0.5 10*3/uL (ref 0.1–1.0)
Monocytes Relative: 4 %
NEUTROS ABS: 8.4 10*3/uL — AB (ref 1.7–7.7)
NEUTROS PCT: 65 %
Platelets: 489 10*3/uL — ABNORMAL HIGH (ref 150–400)
RBC: 3.94 MIL/uL — AB (ref 4.22–5.81)
RDW: 14.2 % (ref 11.5–15.5)
WBC: 12.9 10*3/uL — AB (ref 4.0–10.5)

## 2018-02-13 LAB — GLUCOSE, CAPILLARY
GLUCOSE-CAPILLARY: 140 mg/dL — AB (ref 70–99)
GLUCOSE-CAPILLARY: 397 mg/dL — AB (ref 70–99)
GLUCOSE-CAPILLARY: 301 mg/dL — AB (ref 70–99)
Glucose-Capillary: 106 mg/dL — ABNORMAL HIGH (ref 70–99)
Glucose-Capillary: 137 mg/dL — ABNORMAL HIGH (ref 70–99)
Glucose-Capillary: 170 mg/dL — ABNORMAL HIGH (ref 70–99)
Glucose-Capillary: 174 mg/dL — ABNORMAL HIGH (ref 70–99)
Glucose-Capillary: 205 mg/dL — ABNORMAL HIGH (ref 70–99)
Glucose-Capillary: 224 mg/dL — ABNORMAL HIGH (ref 70–99)
Glucose-Capillary: 246 mg/dL — ABNORMAL HIGH (ref 70–99)
Glucose-Capillary: 256 mg/dL — ABNORMAL HIGH (ref 70–99)
Glucose-Capillary: 273 mg/dL — ABNORMAL HIGH (ref 70–99)
Glucose-Capillary: 377 mg/dL — ABNORMAL HIGH (ref 70–99)
Glucose-Capillary: 412 mg/dL — ABNORMAL HIGH (ref 70–99)
Glucose-Capillary: 581 mg/dL (ref 70–99)

## 2018-02-13 LAB — HEPATIC FUNCTION PANEL
ALT: 20 U/L (ref 0–44)
AST: 23 U/L (ref 15–41)
Albumin: 2.9 g/dL — ABNORMAL LOW (ref 3.5–5.0)
Alkaline Phosphatase: 132 U/L — ABNORMAL HIGH (ref 38–126)
BILIRUBIN DIRECT: 0.1 mg/dL (ref 0.0–0.2)
BILIRUBIN INDIRECT: 0.7 mg/dL (ref 0.3–0.9)
Total Bilirubin: 0.8 mg/dL (ref 0.3–1.2)
Total Protein: 8 g/dL (ref 6.5–8.1)

## 2018-02-13 LAB — BASIC METABOLIC PANEL
Anion gap: 10 (ref 5–15)
Anion gap: 12 (ref 5–15)
Anion gap: 9 (ref 5–15)
Anion gap: 9 (ref 5–15)
BUN: 14 mg/dL (ref 8–23)
BUN: 16 mg/dL (ref 8–23)
BUN: 16 mg/dL (ref 8–23)
BUN: 18 mg/dL (ref 8–23)
CALCIUM: 7.4 mg/dL — AB (ref 8.9–10.3)
CHLORIDE: 101 mmol/L (ref 98–111)
CHLORIDE: 102 mmol/L (ref 98–111)
CHLORIDE: 105 mmol/L (ref 98–111)
CO2: 19 mmol/L — ABNORMAL LOW (ref 22–32)
CO2: 18 mmol/L — ABNORMAL LOW (ref 22–32)
CO2: 17 mmol/L — AB (ref 22–32)
CO2: 18 mmol/L — AB (ref 22–32)
CREATININE: 1.52 mg/dL — AB (ref 0.61–1.24)
CREATININE: 1.62 mg/dL — AB (ref 0.61–1.24)
CREATININE: 1.77 mg/dL — AB (ref 0.61–1.24)
Calcium: 7.2 mg/dL — ABNORMAL LOW (ref 8.9–10.3)
Calcium: 7.3 mg/dL — ABNORMAL LOW (ref 8.9–10.3)
Calcium: 7.3 mg/dL — ABNORMAL LOW (ref 8.9–10.3)
Chloride: 102 mmol/L (ref 98–111)
Creatinine, Ser: 1.39 mg/dL — ABNORMAL HIGH (ref 0.61–1.24)
GFR calc Af Amer: 46 mL/min — ABNORMAL LOW (ref >60)
GFR calc Af Amer: 51 mL/min — ABNORMAL LOW (ref >60)
GFR calc Af Amer: 55 mL/min — ABNORMAL LOW (ref >60)
GFR calc Af Amer: >60 mL/min (ref >60)
GFR calc non Af Amer: 39 mL/min — ABNORMAL LOW (ref >60)
GFR calc non Af Amer: 44 mL/min — ABNORMAL LOW (ref >60)
GFR calc non Af Amer: 47 mL/min — ABNORMAL LOW (ref >60)
GFR, EST NON AFRICAN AMERICAN: 53 mL/min — AB (ref >60)
GLUCOSE: 262 mg/dL — AB (ref 70–99)
GLUCOSE: 232 mg/dL — AB (ref 70–99)
Glucose, Bld: 269 mg/dL — ABNORMAL HIGH (ref 70–99)
Glucose, Bld: 657 mg/dL (ref 70–99)
POTASSIUM: 3.3 mmol/L — AB (ref 3.5–5.1)
POTASSIUM: 3 mmol/L — AB (ref 3.5–5.1)
POTASSIUM: 3.6 mmol/L (ref 3.5–5.1)
POTASSIUM: 3.6 mmol/L (ref 3.5–5.1)
SODIUM: 132 mmol/L — AB (ref 135–145)
SODIUM: 129 mmol/L — AB (ref 135–145)
Sodium: 130 mmol/L — ABNORMAL LOW (ref 135–145)
Sodium: 131 mmol/L — ABNORMAL LOW (ref 135–145)

## 2018-02-13 LAB — BLOOD GAS, VENOUS
Acid-base deficit: 3.8 mmol/L — ABNORMAL HIGH (ref 0.0–2.0)
Bicarbonate: 20.6 mmol/L (ref 20.0–28.0)
FIO2: 21
O2 SAT: 55.8 %
PCO2 VEN: 37.8 mmHg — AB (ref 44.0–60.0)
PH VEN: 7.359 (ref 7.250–7.430)

## 2018-02-13 LAB — PHOSPHORUS
PHOSPHORUS: 1.9 mg/dL — AB (ref 2.5–4.6)
Phosphorus: 2.7 mg/dL (ref 2.5–4.6)

## 2018-02-13 LAB — ECHOCARDIOGRAM COMPLETE: Weight: 2010.6 oz

## 2018-02-13 LAB — TROPONIN I
TROPONIN I: 0.04 ng/mL — AB (ref <0.03)
Troponin I: 0.04 ng/mL (ref <0.03)
Troponin I: 0.04 ng/mL (ref <0.03)

## 2018-02-13 LAB — MAGNESIUM: MAGNESIUM: 1.7 mg/dL (ref 1.7–2.4)

## 2018-02-13 MED ORDER — INSULIN ASPART PROT & ASPART (70-30 MIX) 100 UNIT/ML ~~LOC~~ SUSP
25.0000 [IU] | Freq: Two times a day (BID) | SUBCUTANEOUS | Status: DC
  Administered 2018-02-13 – 2018-02-14 (×2): 25 [IU] via SUBCUTANEOUS
  Filled 2018-02-13: qty 10

## 2018-02-13 MED ORDER — POTASSIUM CHLORIDE 10 MEQ/100ML IV SOLN
10.0000 meq | INTRAVENOUS | Status: AC
  Administered 2018-02-13 (×2): 10 meq via INTRAVENOUS
  Filled 2018-02-13 (×2): qty 100

## 2018-02-13 MED ORDER — NICOTINE 21 MG/24HR TD PT24: 21.0000 mg | MEDICATED_PATCH | Freq: Every day | TRANSDERMAL | Status: DC | PRN

## 2018-02-13 MED ORDER — INSULIN ASPART 100 UNIT/ML ~~LOC~~ SOLN
3.0000 [IU] | Freq: Three times a day (TID) | SUBCUTANEOUS | Status: DC
  Administered 2018-02-13: 3 [IU] via SUBCUTANEOUS

## 2018-02-13 MED ORDER — CARVEDILOL 3.125 MG PO TABS
6.2500 mg | ORAL_TABLET | Freq: Two times a day (BID) | ORAL | Status: DC
  Administered 2018-02-14 – 2018-02-16 (×5): 6.25 mg via ORAL
  Filled 2018-02-13 (×5): qty 2

## 2018-02-13 MED ORDER — LACTATED RINGERS IV SOLN
INTRAVENOUS | Status: AC
  Administered 2018-02-13: 14:00:00 via INTRAVENOUS

## 2018-02-13 MED ORDER — INSULIN ASPART 100 UNIT/ML ~~LOC~~ SOLN
0.0000 [IU] | Freq: Every day | SUBCUTANEOUS | Status: DC
  Administered 2018-02-13: 3 [IU] via SUBCUTANEOUS

## 2018-02-13 MED ORDER — K PHOS MONO-SOD PHOS DI & MONO 155-852-130 MG PO TABS
500.0000 mg | ORAL_TABLET | Freq: Four times a day (QID) | ORAL | Status: AC
  Administered 2018-02-13 – 2018-02-14 (×3): 500 mg via ORAL
  Filled 2018-02-13 (×3): qty 2

## 2018-02-13 MED ORDER — MAGNESIUM SULFATE 2 GM/50ML IV SOLN
2.0000 g | Freq: Once | INTRAVENOUS | Status: AC
  Administered 2018-02-13: 2 g via INTRAVENOUS
  Filled 2018-02-13: qty 50

## 2018-02-13 MED ORDER — CARVEDILOL 3.125 MG PO TABS
3.1250 mg | ORAL_TABLET | Freq: Once | ORAL | Status: AC
  Administered 2018-02-13: 3.125 mg via ORAL
  Filled 2018-02-13: qty 1

## 2018-02-13 MED ORDER — POTASSIUM CHLORIDE CRYS ER 10 MEQ PO TBCR
40.0000 meq | EXTENDED_RELEASE_TABLET | Freq: Once | ORAL | Status: AC
  Administered 2018-02-13: 40 meq via ORAL
  Filled 2018-02-13: qty 4

## 2018-02-13 MED ORDER — INSULIN NPH (HUMAN) (ISOPHANE) 100 UNIT/ML ~~LOC~~ SUSP
10.0000 [IU] | Freq: Two times a day (BID) | SUBCUTANEOUS | Status: DC
  Administered 2018-02-13: 10 [IU] via SUBCUTANEOUS
  Filled 2018-02-13: qty 10

## 2018-02-13 MED ORDER — INSULIN ASPART 100 UNIT/ML ~~LOC~~ SOLN
0.0000 [IU] | Freq: Three times a day (TID) | SUBCUTANEOUS | Status: DC
  Administered 2018-02-13: 15 [IU] via SUBCUTANEOUS
  Administered 2018-02-13: 11 [IU] via SUBCUTANEOUS
  Administered 2018-02-14: 5 [IU] via SUBCUTANEOUS
  Administered 2018-02-14: 11 [IU] via SUBCUTANEOUS
  Administered 2018-02-14: 5 [IU] via SUBCUTANEOUS

## 2018-02-13 MED ORDER — POTASSIUM CHLORIDE CRYS ER 20 MEQ PO TBCR
40.0000 meq | EXTENDED_RELEASE_TABLET | Freq: Once | ORAL | Status: AC
  Administered 2018-02-13: 40 meq via ORAL
  Filled 2018-02-13: qty 2

## 2018-02-13 NOTE — Progress Notes (Signed)
Inpatient Diabetes Program Recommendations  AACE/ADA: New Consensus Statement on Inpatient Glycemic Control (2015)  Target Ranges:  Prepandial:   less than 140 mg/dL      Peak postprandial:   less than 180 mg/dL (1-2 hours)      Critically ill patients:  140 - 180 mg/dL   Lab Results  Component Value Date   GLUCAP 137 (H) 02/13/2018   HGBA1C 6.8 (H) 08/08/2016    Review of Glycemic Control  Diabetes history: DM Outpatient Diabetes medications: Novolog 70/30 insulin 25 units bid Current orders for Inpatient glycemic control: NPH insulin 10 units bid + Novolog 3 units tid + Novolog moderate tid + hs 0-5 units  Inpatient Diabetes Program Recommendations:    Since patient is now eating, change to Novolog 70/30 25 units bid. Patient has required 18 additional correction @ noon CBG. Spoke with patient on 02/05/18 and will follow during hospitalization.  Thank you, Nani Gasser. Kindle Strohmeier, RN, MSN, CDE  Diabetes Coordinator Inpatient Glycemic Control Team Team Pager [phone] (8am-5pm) 02/13/2018 2:22 PM

## 2018-02-13 NOTE — Progress Notes (Signed)
CRITICAL VALUE ALERT  Critical Value:  Troponin 0.04 & glucose-657  Date & Time Notied:  02/13/18 @ 0030  Provider Notified: Dr. Olevia Bowens  Orders Received/Actions taken: Waiting for orders/call back.

## 2018-02-13 NOTE — Telephone Encounter (Signed)
Please remind the family that he has an appt. On Sept 10 with DM specialist Dr. Dorris Fetch. They should ask the hospitalist to consult him while pt is still  in the hospital also. Thanks, WS

## 2018-02-13 NOTE — Progress Notes (Signed)
Patient had 36 beat run of vtach. Hospitalist made aware.

## 2018-02-13 NOTE — Progress Notes (Signed)
Attempted to do echocardiogram. Patient was eating; waited 15 minutes patient still eating. Will check back.  Alvino Chapel, RCS

## 2018-02-13 NOTE — Telephone Encounter (Signed)
Left detailed message on VM.

## 2018-02-13 NOTE — Progress Notes (Signed)
*  PRELIMINARY RESULTS* Echocardiogram 2D Echocardiogram has been performed.  Leavy Cella 02/13/2018, 3:57 PM

## 2018-02-13 NOTE — Progress Notes (Signed)
MD paged with critical result PO2 below reported range. Will continue to monitor.

## 2018-02-13 NOTE — Progress Notes (Signed)
PROGRESS NOTE    Todd [name]  WUJ:[id number] DOB: [birth year] DOA: 02/12/2018 PCP: [name], MD   Brief Narrative:  Todd [name] is a 62 y.o. male with medical history significant of AKI, constipation, occasional diarrhea, history of elevated LFTs, goiter, hypothyroidism, gout, hyperlipidemia, hypertension, reactive thrombocytosis, history of sepsis of undetermined cause, history of wound infection after surgery, bilateral BKA in 2 different procedures, bilateral AKA also and 2 different operations year who is brought to the emergency department due to his CBG monitor reading high since yesterday.  He complains of polyuria, polydipsia, polyphagia and blurry vision.  He admitted to eating or drinking sugary things like ice cream and popsicles, which he was asking for while hours examining him.  He says that he has been compliant with his treatment regimen, has not missed medications, but his initial blood glucose in the ED was at 1040 mg/dL he denies fever, chills, sore throat, hemoptysis, productive cough, dyspnea, chest pain, palpitations, diaphoresis, PND, orthopnea, abdominal pain, nausea, emesis, diarrhea, melena or hematochezia.  Denies dysuria, frequency or hematuria.  Assessment & Plan:   Principal Problem:   Diabetic hyperosmolar non-ketotic state ([place]) Active Problems:   HTN (hypertension)   Hypothyroidism   Diabetic polyneuropathy associated with type 2 diabetes mellitus ([facility])   Tobacco abuse   S/P AKA (above knee amputation) bilateral ([facility])   AKI (acute kidney injury) ([place])  Diabetic Hyperosmolar non ketotic state  Diabetic Polyneuropathy Improved on insulin gtt Will transition to home regimen 25 70/30 BID, adjust as needed Pt will need referral to see Dr. Dorris Fetch on d/c, please arrange appt on d/c (discussed with him as Dr. Livia Snellen was recommending inpatient c/s and he no longer comes into hospital for consults) Diabetic coordinator for education  Acute Kidney Injury:  improving, follow with IVF  Abnormal EKG: troponins stable and flat.  Pt denied any CP.  Echo with grade 1 diastolic dysfunction, no regional WMA.  Consider outpatient cards f/u.  Nonsustained VT: several runs.  Pt with normal EF.  Increase beta blocker, follow.  Replete lytes.  Tobacco Abuse: continue to encourage cessation  S/p bilateral AKA: stable, PT   DVT prophylaxis: lovenox Code Status: full  Family Communication: none at bedside Disposition Plan: pending improvement   Consultants:   none  Procedures:  Study Conclusions  - Left ventricle: The cavity size was normal. Wall thickness was   increased in a pattern of mild LVH. Systolic function was normal.   The estimated ejection fraction was in the range of 55% to 60%.   Wall motion was normal; there were no regional wall motion   abnormalities. Doppler parameters are consistent with abnormal   left ventricular relaxation (grade 1 diastolic dysfunction). - Aortic valve: Moderately calcified annulus. Trileaflet; mildly   calcified leaflets. - Mitral valve: There was trivial regurgitation. - Right atrium: Central venous pressure (est): 3 mm Hg. - Tricuspid valve: There was trivial regurgitation. - Pulmonary arteries: Systolic pressure could not be accurately   estimated. - Pericardium, extracardiac: There was no pericardial effusion.  Antimicrobials:  Anti-infectives (From admission, onward)   None      Subjective: A&Ox3 Denies pain. Came b/c BG's out of control.  Since at least Friday, unreadable.  Objective: Vitals:   02/13/18 1500 02/13/18 1600 02/13/18 1700 02/13/18 1800  BP: 117/63 125/75 123/69 139/71  Pulse: 68 64 70 71  Resp: 18 16 16  (!) 21  Temp:   98 F (36.7 C)   TempSrc:   Oral   SpO2:  100% 100% 100% 100%  Weight:        Intake/Output Summary (Last 24 hours) at 02/13/2018 1857 Last data filed at 02/13/2018 1700 Gross per 24 hour  Intake 7099.16 ml  Output 850 ml  Net 6249.16 ml    Filed Weights   02/12/18 2326 02/13/18 0500  Weight: 57 kg 57 kg    Examination:  General exam: Appears calm and comfortable  Respiratory system: Clear to auscultation. Respiratory effort normal. Cardiovascular system: S1 & S2 heard, RRR.  Gastrointestinal system: Abdomen is nondistended, soft and nontender Central nervous system: Alert and oriented. No focal neurological deficits. Extremities: bilateral AKA Skin: No rashes, lesions or ulcers Psychiatry: Judgement and insight appear normal. Mood & affect appropriate.     Data Reviewed: I have personally reviewed following labs and imaging studies  CBC: Recent Labs  Lab 02/09/18 1222 02/12/18 1841 02/13/18 0829  WBC 9.6 10.5 12.9*  NEUTROABS 6.5  --  8.4*  HGB 12.7* 13.7 11.5*  HCT 39.6 42.5 33.7*  MCV 91 94.0 85.5  PLT 500* 509* 619*   Basic Metabolic Panel: Recent Labs  Lab 02/09/18 1222 02/12/18 1841 02/12/18 2333 02/13/18 0220 02/13/18 0711  NA 123* 120* 131* 132* 130*  K 3.9 4.3 3.6 3.0* 3.6  CL 84* 86* 101 105 102  CO2 20 20* 18* 17* 19*  GLUCOSE 870* 1,040* 657* 262* 232*  BUN 11 20 18 16 16   CREATININE 1.63* 2.00* 1.77* 1.62* 1.52*  CALCIUM 7.3* 7.6* 7.3* 7.3* 7.2*  MG  --   --  1.7  --   --   PHOS  --   --  2.7  --  1.9*   GFR: Estimated Creatinine Clearance: 40.6 mL/min (A) (by C-G formula based on SCr of 1.52 mg/dL (H)). Liver Function Tests: Recent Labs  Lab 02/09/18 1222 02/12/18 2333  AST 17 23  ALT 13 20  ALKPHOS 141* 132*  BILITOT 0.4 0.8  PROT 7.7 8.0  ALBUMIN 3.3* 2.9*   No results for input(s): LIPASE, AMYLASE in the last 168 hours. No results for input(s): AMMONIA in the last 168 hours. Coagulation Profile: No results for input(s): INR, PROTIME in the last 168 hours. Cardiac Enzymes: Recent Labs  Lab 02/12/18 2333 02/13/18 0220 02/13/18 0711  TROPONINI 0.04* 0.04* 0.04*   BNP (last 3 results) No results for input(s): PROBNP in the last 8760 hours. HbA1C: No  results for input(s): HGBA1C in the last 72 hours. CBG: Recent Labs  Lab 02/13/18 1008 02/13/18 1111 02/13/18 1223 02/13/18 1328 02/13/18 1622  GLUCAP 174* 137* 205* 377* 301*   Lipid Profile: No results for input(s): CHOL, HDL, LDLCALC, TRIG, CHOLHDL, LDLDIRECT in the last 72 hours. Thyroid Function Tests: No results for input(s): TSH, T4TOTAL, FREET4, T3FREE, THYROIDAB in the last 72 hours. Anemia Panel: No results for input(s): VITAMINB12, FOLATE, FERRITIN, TIBC, IRON, RETICCTPCT in the last 72 hours. Sepsis Labs: No results for input(s): PROCALCITON, LATICACIDVEN in the last 168 hours.  No results found for this or any previous visit (from the past 240 hour(s)).       Radiology Studies: No results found.      Scheduled Meds: . [START ON 02/14/2018] carvedilol  6.25 mg Oral BID WC  . enoxaparin (LOVENOX) injection  40 mg Subcutaneous Q24H  . folic acid  1 mg Oral Daily  . insulin aspart  0-15 Units Subcutaneous TID WC  . insulin aspart  0-5 Units Subcutaneous QHS  . insulin aspart protamine- aspart  25  Units Subcutaneous BID WC  . levothyroxine  75 mcg Oral QAC breakfast  . thiamine  100 mg Oral Daily   Continuous Infusions: . lactated ringers 50 mL/hr at 02/13/18 1331     LOS: 1 day    Time spent: over 30 min Mod MDM with multiple medical issues    Fayrene Helper, MD [facility] Pager [phone]  If 7PM-7AM, please contact night-coverage [email or web address] Password [facility] 02/13/2018, 6:57 PM

## 2018-02-13 NOTE — Progress Notes (Signed)
Pt had 6 beat run of Vtach- Pt asymptomatic.BP-117/70, HR- 66, pt resting quietly in room. NO c/o CP or rapid heart beat. Dr. Florene Glen paged and made aware. Waiting for call back/orders. Will continue to monitor pt

## 2018-02-14 ENCOUNTER — Ambulatory Visit (HOSPITAL_COMMUNITY): Payer: Medicaid Other

## 2018-02-14 DIAGNOSIS — E876 Hypokalemia: Secondary | ICD-10-CM | POA: Diagnosis present

## 2018-02-14 DIAGNOSIS — E871 Hypo-osmolality and hyponatremia: Secondary | ICD-10-CM | POA: Diagnosis present

## 2018-02-14 DIAGNOSIS — Z720 Tobacco use: Secondary | ICD-10-CM

## 2018-02-14 DIAGNOSIS — E1129 Type 2 diabetes mellitus with other diabetic kidney complication: Secondary | ICD-10-CM

## 2018-02-14 DIAGNOSIS — N183 Chronic kidney disease, stage 3 (moderate): Secondary | ICD-10-CM

## 2018-02-14 DIAGNOSIS — E1122 Type 2 diabetes mellitus with diabetic chronic kidney disease: Secondary | ICD-10-CM | POA: Diagnosis present

## 2018-02-14 DIAGNOSIS — Z9119 Patient's noncompliance with other medical treatment and regimen: Secondary | ICD-10-CM

## 2018-02-14 DIAGNOSIS — Z91199 Patient's noncompliance with other medical treatment and regimen due to unspecified reason: Secondary | ICD-10-CM

## 2018-02-14 LAB — CBC
HEMATOCRIT: 33.7 % — AB (ref 39.0–52.0)
Hemoglobin: 11.6 g/dL — ABNORMAL LOW (ref 13.0–17.0)
MCH: 29.2 pg (ref 26.0–34.0)
MCHC: 34.4 g/dL (ref 30.0–36.0)
MCV: 84.9 fL (ref 78.0–100.0)
Platelets: 469 10*3/uL — ABNORMAL HIGH (ref 150–400)
RBC: 3.97 MIL/uL — AB (ref 4.22–5.81)
RDW: 14.3 % (ref 11.5–15.5)
WBC: 11.3 10*3/uL — ABNORMAL HIGH (ref 4.0–10.5)

## 2018-02-14 LAB — BASIC METABOLIC PANEL
Anion gap: 7 (ref 5–15)
BUN: 13 mg/dL (ref 8–23)
CALCIUM: 7.4 mg/dL — AB (ref 8.9–10.3)
CO2: 17 mmol/L — AB (ref 22–32)
CREATININE: 1.32 mg/dL — AB (ref 0.61–1.24)
Chloride: 108 mmol/L (ref 98–111)
GFR calc Af Amer: >60 mL/min (ref >60)
GFR calc non Af Amer: 56 mL/min — ABNORMAL LOW (ref >60)
GLUCOSE: 285 mg/dL — AB (ref 70–99)
Potassium: 3.7 mmol/L (ref 3.5–5.1)
Sodium: 132 mmol/L — ABNORMAL LOW (ref 135–145)

## 2018-02-14 LAB — GLUCOSE, CAPILLARY
GLUCOSE-CAPILLARY: 325 mg/dL — AB (ref 70–99)
GLUCOSE-CAPILLARY: 223 mg/dL — AB (ref 70–99)
Glucose-Capillary: 221 mg/dL — ABNORMAL HIGH (ref 70–99)

## 2018-02-14 LAB — PHOSPHORUS: Phosphorus: 2.3 mg/dL — ABNORMAL LOW (ref 2.5–4.6)

## 2018-02-14 LAB — MAGNESIUM: Magnesium: 1.8 mg/dL (ref 1.7–2.4)

## 2018-02-14 MED ORDER — SODIUM CHLORIDE 0.9 % IV SOLN: INTRAVENOUS | Status: DC

## 2018-02-14 MED ORDER — LIVING WELL WITH DIABETES BOOK
Freq: Once | Status: AC
  Administered 2018-02-14: 18:00:00

## 2018-02-14 MED ORDER — INSULIN ASPART PROT & ASPART (70-30 MIX) 100 UNIT/ML ~~LOC~~ SUSP
30.0000 [IU] | Freq: Two times a day (BID) | SUBCUTANEOUS | Status: DC
  Administered 2018-02-14 – 2018-02-16 (×4): 30 [IU] via SUBCUTANEOUS
  Filled 2018-02-14: qty 10

## 2018-02-14 NOTE — Progress Notes (Addendum)
Inpatient Diabetes Program Recommendations  AACE/ADA: New Consensus Statement on Inpatient Glycemic Control (2019)  Target Ranges:  Prepandial:   less than 140 mg/dL      Peak postprandial:   less than 180 mg/dL (1-2 hours)      Critically ill patients:  140 - 180 mg/dL  Results for Todd [name], Todd [name] (MRN [id number]) as of 02/14/2018 10:30  Ref. Range 02/13/2018 07:47 02/13/2018 09:05 02/13/2018 10:08 02/13/2018 11:11 02/13/2018 12:23 02/13/2018 13:28 02/13/2018 16:22 02/13/2018 21:26 02/14/2018 07:37  Glucose-Capillary Latest Ref Range: 70 - 99 mg/dL 397 (H) 246 (H) 174 (H) 137 (H)  NPH 10 units 205 (H) 377 (H)  Novolog 18 units 301 (H)  Novolog 11 units  70/30 25 units 273 (H)  Novolog 3 units 325 (H)  Novolog 11 units  70/30 25 units    Review of Glycemic Control  Outpatient Diabetes medications: 70/30 25 units BID Current orders for Inpatient glycemic control: 70/30 25 units BID, Novolog 0-15 units TID with meals, Novolog 0-5 units QHS  Inpatient Diabetes Program Recommendations: Insulin - Basal: Noted patient received NPH 10 units on 02/13/18 at time of transition off IV insulin. Then ordered 70/30 25 units BID and patient received evening dose of 70/30 25 units on 02/13/18. IF glucose continues to be consistently greater than 180 mg/dl, please consider increasing 70/30 to 30 units BID. Inpatient Consult: MD may want to consider consulting Dr. Dorris Fetch while inpatient for recommendations for glycemic control.  NOTE: Patient has been admitted 3 times in past 6 months. Per chart, Inpatient Diabetes Coordinator spoke with patient on 02/05/18 during last hospitalization. Noted telephone encounter on 02/14/18 by DR. [name] (PCP) asking staff to remind patient and family that patient has an initial visit with DR. NIDA Careers adviser) on September 10th.  Addendum 02/14/18@15 :00-Spoke with patient about diabetes and home regimen for diabetes control. Patient reports that he is followed by PCP for diabetes  management and currently he takes 70/30 25 units BID as an outpatient for diabetes control. Patient reports that he is taking insulin as prescribed.  Patient states that he checks his glucose 2 times per day and that it hs been running high "over 300 mg/dl" lately.  Discussed initial glucose of 1040 mg/dl on 02/12/18 and frequent hospital admissions (3 in last 6 months). Patient states again that he was taking 70/30 insulin as prescribed, insulin is in date, and he keeps insulin refrigerated.   Discussed glucose and A1C goals, importance of checking CBGs and maintaining good CBG control to prevent long-term and short-term complications. Explained how hyperglycemia leads to damage within blood vessels which lead to the common complications seen with uncontrolled diabetes. Stressed to the patient the importance of improving glycemic control to prevent further complications from uncontrolled diabetes. Discussed impact of nutrition, exercise, stress, sickness, and medications on diabetes control.Patient states his diet is "wild, just whatever I have at home."  Patient notes that he usually does not eat breakfast but takes 70/30 every morning regardless of whether he eats or not. Discussed importance of eating meals regularly especially with 70/30 insulin. Patient states that he could use more education on Carb Modified diet. Discussed carbohydrates, carbohydrate goals per day and meal, along with portion sizes. Informed patient RD consult would also be requested while inpatient. Encouraged patient to check his glucose 3-4 times per day, to take insulin consistently as prescribed, to keep a good record of glucose and insulin taken, and follow up with doctor. Discussed upcoming appointment with Dr. Dorris Fetch on September 10 (  per PCP telephone note for today) and encouraged patient to keep appointment.  Patient verbalized understanding of information discussed and he states that he has no further questions at this time related  to diabetes.  Thanks, Barnie Alderman, RN, MSN, CDE Diabetes Coordinator Inpatient Diabetes Program [phone] (Team Pager from 8am to 5pm)

## 2018-02-14 NOTE — Evaluation (Signed)
Physical Therapy Evaluation Patient Details Name: Todd [name] MRN: [id number] DOB: Dec 11, 1955 Today's Date: 02/14/2018   History of Present Illness  Kiing Deakin is a 62 y.o. male with medical history significant of AKI, constipation, occasional diarrhea, history of elevated LFTs, goiter, hypothyroidism, gout, hyperlipidemia, hypertension, reactive thrombocytosis, history of sepsis of undetermined cause, history of wound infection after surgery, bilateral BKA in 2 different procedures, bilateral AKA also and 2 different operations year who is brought to the emergency department due to his CBG monitor reading high since yesterday.  He complains of polyuria, polydipsia, polyphagia and blurry vision.  He admitted to eating or drinking sugary things like ice cream and popsicles, which he was asking for while hours examining him.  He says that he has been compliant with his treatment regimen, has not missed medications, but his initial blood glucose in the ED was at 1040 mg/dL he denies fever, chills, sore throat, hemoptysis, productive cough, dyspnea, chest pain, palpitations, diaphoresis, PND, orthopnea, abdominal pain, nausea, emesis, diarrhea, melena or hematochezia.  Denies dysuria, frequency or hematuria.    Clinical Impression  Patient demonstrates slow labored movement when completing bed<>chair transfers, has most difficulty secondary to not having overhead trapeze to help pull self from chair to bed as set up he has at home.  Overall patient functioning near baseline and tolerated staying up in chair after therapy.  Patient will benefit from continued physical therapy in hospital and recommended venue below to increase strength, balance, endurance for safe ADLs and gait.    Follow Up Recommendations Home health PT;Supervision for mobility/OOB    Equipment Recommendations  None recommended by PT    Recommendations for Other Services       Precautions / Restrictions  Precautions Precautions: Fall Restrictions Weight Bearing Restrictions: No      Mobility  Bed Mobility Overal bed mobility: Needs Assistance Bed Mobility: Rolling;Supine to Sit;Sit to Supine Rolling: Supervision   Supine to sit: Min assist Sit to supine: Supervision   General bed mobility comments: demonstrates slow labored movement, requires assistance to pull self up to sitting because no overhead trapeze available  Transfers Overall transfer level: Needs assistance Equipment used: 1 person hand held assist Transfers: Lateral/Scoot Transfers          Lateral/Scoot Transfers: Min guard;From elevated surface General transfer comment: patient able to use BUE for scooting to chair from bed due to bed elevated, has to crawl and roll to side for getting back into bed from chair  Ambulation/Gait                Stairs            Wheelchair Mobility    Modified Rankin (Stroke Patients Only)       Balance Overall balance assessment: Needs assistance Sitting-balance support: Feet unsupported;Bilateral upper extremity supported Sitting balance-Leahy Scale: Fair Sitting balance - Comments: able to maintain sitting balance supporting with BUE                                     Pertinent Vitals/Pain Pain Assessment: No/denies pain    Home Living Family/patient expects to be discharged to:: Private residence Living Arrangements: Parent Available Help at Discharge: Family(his mother can help a little per patient) Type of Home: House Home Access: Level entry(coming in through carport)     Home Layout: One level Home Equipment: Wheelchair - manual;Hospital bed;Other (comment) Additional Comments: has overhead  trapeze on hospital bed    Prior Function Level of Independence: Independent with assistive device(s)         Comments: uses manual wheelchair for mobility and usually transfers from bed to chair using overhead trapeze without  assistance     Hand Dominance   Dominant Hand: Right    Extremity/Trunk Assessment   Upper Extremity Assessment Upper Extremity Assessment: Generalized weakness    Lower Extremity Assessment Lower Extremity Assessment: Generalized weakness    Cervical / Trunk Assessment Cervical / Trunk Assessment: Normal  Communication   Communication: No difficulties  Cognition Arousal/Alertness: Awake/alert Behavior During Therapy: WFL for tasks assessed/performed Overall Cognitive Status: Within Functional Limits for tasks assessed                                        General Comments      Exercises     Assessment/Plan    PT Assessment Patient needs continued PT services  PT Problem List Decreased strength;Decreased activity tolerance;Decreased mobility       PT Treatment Interventions Functional mobility training;Therapeutic activities;Therapeutic exercise;Wheelchair mobility training;Patient/family education;Balance training    PT Goals (Current goals can be found in the Care Plan section)  Acute Rehab PT Goals Patient Stated Goal: return home PT Goal Formulation: With patient Time For Goal Achievement: 02/21/18 Potential to Achieve Goals: Good    Frequency Min 3X/week   Barriers to discharge        Co-evaluation               AM-PAC PT "6 Clicks" Daily Activity  Outcome Measure Difficulty turning over in bed (including adjusting bedclothes, sheets and blankets)?: None Difficulty moving from lying on back to sitting on the side of the bed? : A Little Difficulty sitting down on and standing up from a chair with arms (e.g., wheelchair, bedside commode, etc,.)?: Unable Help needed moving to and from a bed to chair (including a wheelchair)?: A Little Help needed walking in hospital room?: Total Help needed climbing 3-5 steps with a railing? : Total 6 Click Score: 13    End of Session   Activity Tolerance: Patient tolerated treatment  well;Patient limited by fatigue Patient left: in chair;with call bell/phone within reach Nurse Communication: Mobility status PT Visit Diagnosis: Muscle weakness (generalized) (M62.81);Difficulty in walking, not elsewhere classified (R26.2);Adult, failure to thrive (R62.7)    Time: 0852-0919 PT Time Calculation (min) (ACUTE ONLY): 27 min   Charges:   PT Evaluation $PT Eval Moderate Complexity: 1 Mod PT Treatments $Therapeutic Activity: 23-37 mins        2:33 PM, 02/14/18 Lonell Grandchild, MPT Physical Therapist with [facility] [phone] office [phone] mobile phone

## 2018-02-14 NOTE — Progress Notes (Signed)
Pt requesting ice cream pt informed ice cream is not best choice of food when sugars are high. Pt states he eats 2 popsicles every day at home. Pt informed its not a good choice in food since his sugars are high.

## 2018-02-14 NOTE — Progress Notes (Signed)
PROGRESS NOTE    Todd [name]  DQQ:[id number] DOB: [birth year] DOA: 02/12/2018 PCP: [name], MD   Brief Narrative:  Todd [name]  a 62 y.o.malewith medical history significant ofCKD stage III due to IDDM on Novolin 70/30 25 units bid, hypothyroidism, gout, HLD, HTN, bilateral BKA in 2 different procedures, subsequent bilateral AKA, is admitted on 02/12/18 for hyperosmolar hyperglycemia syndrome with symptoms of polyuria, polydipsia, and polyphagia, as well as blurry vision. He acknowledged that he drinks a lot of soda. He also takes sweet stuffs as ice cream and popsicles. Denies missing insulin dose at home. No evidence of infection. Associated with HHS is hyponatremia and hypokalemia. Hypokalemia is corrected with potassium supplement. Hyponatremia improves with IVF. With insulin drip and IVF, HHS resolved. Increased Novolin 70/30 to 30 units bid.    Assessment & Plan:   Principal Problem:   Hyperglycemia due to type 2 diabetes mellitus (HCC) Active Problems:   HTN (hypertension)   Hypothyroidism   DM type 2, uncontrolled, with renal complications (HCC)   Tobacco abuse   S/P AKA (above knee amputation) bilateral (HCC)   CKD stage 3 due to type 2 diabetes mellitus ([place])   Hyponatremia   Medically noncompliant   Plan -can transfer out of ICU today given HHS is resolved and he's off insulin drip -will continue one more day of IV NS given Na 132 which is already improving -increase Novolin 70/30 to 30 units bid, continue SSI, consistent carb diet; pt is noncompliant -AKI resolved, back to CKD 3 baseline Cr -nicotine patch and smoking cessation education -BP controlled -levothyroxine daily -possible dc in am -Pt will need referral to see Dr. Dorris Fetch on d/c   DVT prophylaxis: lovenox Code Status: full Family Communication: no family at bedside Disposition Plan: home   Consultants:   Diabetic educator  Procedures: none  Antimicrobials:  none   Subjective: Pt feels well, no complaints. BS low 200's. He's asking for more gram crackers. Na 132, K 3.7. Cr 1.32 which is his baseline.   Objective: Vitals:   02/14/18 1110 02/14/18 1209 02/14/18 1300 02/14/18 1500  BP:  (!) 136/93 (!) 151/83 129/74  Pulse:   62 65  Resp: 12 15 17 18   Temp: 97.6 F (36.4 C)     TempSrc: Oral     SpO2:  100% 100% 100%  Weight:        Intake/Output Summary (Last 24 hours) at 02/14/2018 1626 Last data filed at 02/14/2018 1300 Gross per 24 hour  Intake 2065.83 ml  Output 1050 ml  Net 1015.83 ml   Filed Weights   02/12/18 2326 02/13/18 0500 02/14/18 0500  Weight: 57 kg 57 kg 57.3 kg    Examination:  General exam: Appears calm and comfortable  Respiratory system: Clear to auscultation. Respiratory effort normal. Cardiovascular system: S1 & S2 heard, RRR. No JVD, murmurs, rubs, gallops or clicks. No pedal edema. Gastrointestinal system: Abdomen is nondistended, soft and nontender. No organomegaly or masses felt. Normal bowel sounds heard. Central nervous system: Alert and oriented. No focal neurological deficits. Extremities: bilateral AKA Skin: No rashes, lesions or ulcers Psychiatry: Judgement and insight appear normal. Mood & affect appropriate.     Data Reviewed: I have personally reviewed following labs and imaging studies  CBC: Recent Labs  Lab 02/09/18 1222 02/12/18 1841 02/13/18 0829 02/14/18 0408  WBC 9.6 10.5 12.9* 11.3*  NEUTROABS 6.5  --  8.4*  --   HGB 12.7* 13.7 11.5* 11.6*  HCT 39.6 42.5 33.7* 33.7*  MCV  91 94.0 85.5 84.9  PLT 500* 509* 489* 883*   Basic Metabolic Panel: Recent Labs  Lab 02/12/18 2333 02/13/18 0220 02/13/18 0711 02/13/18 2039 02/14/18 0408  NA 131* 132* 130* 129* 132*  K 3.6 3.0* 3.6 3.3* 3.7  CL 101 105 102 102 108  CO2 18* 17* 19* 18* 17*  GLUCOSE 657* 262* 232* 269* 285*  BUN 18 16 16 14 13   CREATININE 1.77* 1.62* 1.52* 1.39* 1.32*  CALCIUM 7.3* 7.3* 7.2* 7.4* 7.4*  MG 1.7   --   --   --  1.8  PHOS 2.7  --  1.9*  --  2.3*   GFR: Estimated Creatinine Clearance: 47 mL/min (A) (by C-G formula based on SCr of 1.32 mg/dL (H)). Liver Function Tests: Recent Labs  Lab 02/09/18 1222 02/12/18 2333  AST 17 23  ALT 13 20  ALKPHOS 141* 132*  BILITOT 0.4 0.8  PROT 7.7 8.0  ALBUMIN 3.3* 2.9*   No results for input(s): LIPASE, AMYLASE in the last 168 hours. No results for input(s): AMMONIA in the last 168 hours. Coagulation Profile: No results for input(s): INR, PROTIME in the last 168 hours. Cardiac Enzymes: Recent Labs  Lab 02/12/18 2333 02/13/18 0220 02/13/18 0711  TROPONINI 0.04* 0.04* 0.04*   BNP (last 3 results) No results for input(s): PROBNP in the last 8760 hours. HbA1C: No results for input(s): HGBA1C in the last 72 hours. CBG: Recent Labs  Lab 02/13/18 1328 02/13/18 1622 02/13/18 2126 02/14/18 0737 02/14/18 1108  GLUCAP 377* 301* 273* 325* 221*   Lipid Profile: No results for input(s): CHOL, HDL, LDLCALC, TRIG, CHOLHDL, LDLDIRECT in the last 72 hours. Thyroid Function Tests: No results for input(s): TSH, T4TOTAL, FREET4, T3FREE, THYROIDAB in the last 72 hours. Anemia Panel: No results for input(s): VITAMINB12, FOLATE, FERRITIN, TIBC, IRON, RETICCTPCT in the last 72 hours. Sepsis Labs: No results for input(s): PROCALCITON, LATICACIDVEN in the last 168 hours.  No results found for this or any previous visit (from the past 240 hour(s)).       Radiology Studies: No results found.      Scheduled Meds: . carvedilol  6.25 mg Oral BID WC  . enoxaparin (LOVENOX) injection  40 mg Subcutaneous Q24H  . folic acid  1 mg Oral Daily  . insulin aspart  0-15 Units Subcutaneous TID WC  . insulin aspart  0-5 Units Subcutaneous QHS  . insulin aspart protamine- aspart  30 Units Subcutaneous BID WC  . levothyroxine  75 mcg Oral QAC breakfast  . living well with diabetes book   Does not apply Once  . thiamine  100 mg Oral Daily    Continuous Infusions: . sodium chloride       LOS: 2 days    Time spent: 25 min    Paticia Stack, MD [facility] Pager [phone]  If 7PM-7AM, please contact night-coverage [email or web address] Password [email or web address] 02/14/2018, 4:26 PM

## 2018-02-14 NOTE — Plan of Care (Signed)
  Problem: Acute Rehab PT Goals(only PT should resolve) Goal: Pt will Roll Supine to Side Outcome: Progressing Flowsheets (Taken 02/14/2018 1435) Pt will Roll Supine to Side: with modified independence Goal: Pt Will Go Supine/Side To Sit Outcome: Progressing Flowsheets (Taken 02/14/2018 1435) Pt will go Supine/Side to Sit: with modified independence Goal: Pt Will Go Sit To Supine/Side Outcome: Progressing Flowsheets (Taken 02/14/2018 1435) Pt will go Sit to Supine/Side: with modified independence Goal: Pt Will Transfer Bed To Chair/Chair To Bed Outcome: Progressing Flowsheets (Taken 02/14/2018 1435) Pt will Transfer Bed to Chair/Chair to Bed: with supervision  Use sliding board if necessary  2:36 PM, 02/14/18 Lonell Grandchild, MPT Physical Therapist with [facility] [phone] office [phone] mobile phone

## 2018-02-14 NOTE — Plan of Care (Signed)
  Problem: Education: Goal: Knowledge of General Education information will improve Description Including pain rating scale, medication(s)/side effects and non-pharmacologic comfort measures Outcome: Progressing   Problem: Health Behavior/Discharge Planning: Goal: Ability to manage health-related needs will improve Outcome: Progressing   Problem: Clinical Measurements: Goal: Ability to maintain clinical measurements within normal limits will improve Outcome: Progressing Goal: Will remain free from infection Outcome: Progressing Goal: Diagnostic test results will improve Outcome: Progressing Goal: Cardiovascular complication will be avoided Outcome: Progressing   Problem: Activity: Goal: Risk for activity intolerance will decrease Outcome: Progressing   Problem: Nutrition: Goal: Adequate nutrition will be maintained Outcome: Progressing   Problem: Coping: Goal: Level of anxiety will decrease Outcome: Progressing   Problem: Safety: Goal: Ability to remain free from injury will improve Outcome: Progressing   Problem: Skin Integrity: Goal: Risk for impaired skin integrity will decrease Outcome: Progressing   Problem: Fluid Volume: Goal: Ability to maintain a balanced intake and output will improve Outcome: Progressing   Problem: Health Behavior/Discharge Planning: Goal: Ability to identify and utilize available resources and services will improve Outcome: Progressing   Problem: Metabolic: Goal: Ability to maintain appropriate glucose levels will improve Outcome: Progressing

## 2018-02-15 LAB — BASIC METABOLIC PANEL
ANION GAP: 10 (ref 5–15)
ANION GAP: 11 (ref 5–15)
ANION GAP: 10 (ref 5–15)
Anion gap: 5 (ref 5–15)
BUN: 11 mg/dL (ref 8–23)
BUN: 12 mg/dL (ref 8–23)
BUN: 10 mg/dL (ref 8–23)
BUN: 9 mg/dL (ref 8–23)
CHLORIDE: 104 mmol/L (ref 98–111)
CHLORIDE: 107 mmol/L (ref 98–111)
CHLORIDE: 104 mmol/L (ref 98–111)
CO2: 17 mmol/L — AB (ref 22–32)
CO2: 17 mmol/L — ABNORMAL LOW (ref 22–32)
CO2: 17 mmol/L — ABNORMAL LOW (ref 22–32)
CO2: 21 mmol/L — ABNORMAL LOW (ref 22–32)
Calcium: 6.7 mg/dL — ABNORMAL LOW (ref 8.9–10.3)
Calcium: 7 mg/dL — ABNORMAL LOW (ref 8.9–10.3)
Calcium: 7.2 mg/dL — ABNORMAL LOW (ref 8.9–10.3)
Calcium: 7.5 mg/dL — ABNORMAL LOW (ref 8.9–10.3)
Chloride: 105 mmol/L (ref 98–111)
Creatinine, Ser: 1.25 mg/dL — ABNORMAL HIGH (ref 0.61–1.24)
Creatinine, Ser: 1.26 mg/dL — ABNORMAL HIGH (ref 0.61–1.24)
Creatinine, Ser: 1.23 mg/dL (ref 0.61–1.24)
Creatinine, Ser: 1.21 mg/dL (ref 0.61–1.24)
GFR calc Af Amer: >60 mL/min (ref >60)
GFR calc Af Amer: >60 mL/min (ref >60)
GFR calc Af Amer: >60 mL/min (ref >60)
GFR calc non Af Amer: 59 mL/min — ABNORMAL LOW (ref >60)
GFR calc non Af Amer: >60 mL/min (ref >60)
GLUCOSE: 464 mg/dL — AB (ref 70–99)
GLUCOSE: 187 mg/dL — AB (ref 70–99)
GLUCOSE: 64 mg/dL — AB (ref 70–99)
Glucose, Bld: 497 mg/dL — ABNORMAL HIGH (ref 70–99)
POTASSIUM: 3.1 mmol/L — AB (ref 3.5–5.1)
Potassium: 3.1 mmol/L — ABNORMAL LOW (ref 3.5–5.1)
Potassium: 3.2 mmol/L — ABNORMAL LOW (ref 3.5–5.1)
Potassium: 3.4 mmol/L — ABNORMAL LOW (ref 3.5–5.1)
SODIUM: 127 mmol/L — AB (ref 135–145)
Sodium: 131 mmol/L — ABNORMAL LOW (ref 135–145)
Sodium: 135 mmol/L (ref 135–145)
Sodium: 135 mmol/L (ref 135–145)

## 2018-02-15 LAB — GLUCOSE, CAPILLARY
GLUCOSE-CAPILLARY: 493 mg/dL — AB (ref 70–99)
GLUCOSE-CAPILLARY: 438 mg/dL — AB (ref 70–99)
GLUCOSE-CAPILLARY: 248 mg/dL — AB (ref 70–99)
GLUCOSE-CAPILLARY: 230 mg/dL — AB (ref 70–99)
GLUCOSE-CAPILLARY: 185 mg/dL — AB (ref 70–99)
GLUCOSE-CAPILLARY: 139 mg/dL — AB (ref 70–99)
GLUCOSE-CAPILLARY: 83 mg/dL (ref 70–99)
Glucose-Capillary: 166 mg/dL — ABNORMAL HIGH (ref 70–99)
Glucose-Capillary: 202 mg/dL — ABNORMAL HIGH (ref 70–99)
Glucose-Capillary: 461 mg/dL — ABNORMAL HIGH (ref 70–99)
Glucose-Capillary: 60 mg/dL — ABNORMAL LOW (ref 70–99)
Glucose-Capillary: 94 mg/dL (ref 70–99)

## 2018-02-15 MED ORDER — SODIUM CHLORIDE 0.9 % IV SOLN
INTRAVENOUS | Status: DC
  Administered 2018-02-15: 05:00:00 via INTRAVENOUS

## 2018-02-15 MED ORDER — INSULIN ASPART 100 UNIT/ML ~~LOC~~ SOLN
0.0000 [IU] | Freq: Three times a day (TID) | SUBCUTANEOUS | Status: DC
  Administered 2018-02-15 – 2018-02-16 (×2): 3 [IU] via SUBCUTANEOUS
  Administered 2018-02-16: 2 [IU] via SUBCUTANEOUS

## 2018-02-15 MED ORDER — POTASSIUM CHLORIDE 10 MEQ/100ML IV SOLN
10.0000 meq | INTRAVENOUS | Status: AC
  Administered 2018-02-15 (×2): 10 meq via INTRAVENOUS
  Filled 2018-02-15 (×2): qty 100

## 2018-02-15 MED ORDER — SODIUM CHLORIDE 0.9 % IV SOLN
INTRAVENOUS | Status: DC
  Administered 2018-02-15: 4.3 [IU]/h via INTRAVENOUS
  Filled 2018-02-15 (×2): qty 1

## 2018-02-15 MED ORDER — INSULIN ASPART 100 UNIT/ML ~~LOC~~ SOLN
0.0000 [IU] | Freq: Every day | SUBCUTANEOUS | Status: DC
  Administered 2018-02-15: 2 [IU] via SUBCUTANEOUS

## 2018-02-15 MED ORDER — INSULIN ASPART 100 UNIT/ML ~~LOC~~ SOLN
10.0000 [IU] | Freq: Once | SUBCUTANEOUS | Status: AC
  Administered 2018-02-15: 10 [IU] via SUBCUTANEOUS

## 2018-02-15 MED ORDER — SODIUM CHLORIDE 0.9 % IV BOLUS
250.0000 mL | Freq: Once | INTRAVENOUS | Status: AC
  Administered 2018-02-15: 250 mL via INTRAVENOUS

## 2018-02-15 MED ORDER — DEXTROSE-NACL 5-0.45 % IV SOLN
INTRAVENOUS | Status: DC
  Administered 2018-02-15: 07:00:00 via INTRAVENOUS

## 2018-02-15 NOTE — Discharge Instructions (Signed)
You met with a dietitian during your admission. Per our discussion: Summary of goals/reccomendations: (copied into D/C instructions) 1. Dont skip meals! Eat breakfast. Eat protein +carbohydrate.  Possibilities include: Egg on [employer] bread, egg+ fruit, cheese and grapes, PB +Banana+ protein bar. No biscuits/Juice! 2. Choose whole grain bread 3. Aim for 60-75g Carb/meal. Read labels! 4. Make sure you include a vegetable +Protein source at each meal. Follow plate methoid

## 2018-02-15 NOTE — Progress Notes (Signed)
Spoke with Dr. Crisoforo Oxford advised to give 70/30 this am and turn gtt off in 2 hours. Pt is able to eat now. MD advised to snacks between meals. Will continue to monitor and pt made aware of no snacks between meals.

## 2018-02-15 NOTE — Plan of Care (Signed)
Nutrition Education Note  RD consulted for nutrition education regarding diabetes.   Lab Results  Component Value Date   HGBA1C 6.8 (H) 08/08/2016  02/02/18-a1c was >14.0. Was 5.8 only 3 months ago?  DM coordinator consulted RD as patient had reported he was skipping meals and would benefit from education on DM diet.   Pt alone on RD arrival today. Patient says he cannot make any meals himself on account of Bilateral AKA. He is dependent on his mother for meals and aspects of care.   RD tried to get diet recall, but pt is shortspoken and poor historian. He does not give much detail regarding his diet habits. He does report drinking juice- "a glass". He "sometimes" drinking regular soda, but mostly drinks water. He eats out "ocassionally". He does admit he skips meals and sounds to associate this to the fact he cant prepare any meals himself (assume mother is unavailable at this time)  RD reviewed the importance of eating atleast 3 meals/day and how skipping meals affects his body physiologically. Then provided patient handout with myplate guidelines and made suggestions regarding Quick/easy breakfast options.  RD noted he could determine how appropriate a meal was by counting carbohydrates. Reviewed how to read a nutrition label and determine the amount of carbohydrates he is consuming. Gave recommendation to consume 60-75g Carb per meal. Provided associated handouts.    If does not read labels, he can still do a quick assessment of a meal with plate method. Went through the different parts of plate method. Reviewed starchy vegetables.  RD asked patient if he would also like education on snacks. He said he would. RD provided patient with a handout that listed several snack possibilities. Reviewed an ideal snack will contain some protein +cabrohydrate (15-30g). Wrote added suggestions on handout.   Made other diet recommendations such as switching to whole grain breads and  avoiding dense  carbohydrates such as biscuits.   Summary of goals/reccomendations: (copied into D/C instructions) 1. Dont skip meals! Eat breakfast. Eat protein +carbohydrate.  Possibilities include: Egg on [employer] bread, egg+ fruit, cheese and grapes, PB +Banana+ protein bar. No biscuits/Juice! 2. Choose whole grain bread 3. Aim for 60-75g Carb/meal. Read labels! 4. Make sure you include a vegetable +Protein source at each meal. Follow plate methoid  Expect Poor-Fair compliance. Pt does not seem to want to take any responsibility for own health. Relies fully on mother for meals/care.Though he has no legs, he still should be able to navigate around in wheelchair.   Labs and medications reviewed. No further nutrition interventions warranted at this time.  If additional nutrition issues arise, please re-consult RD.  Burtis Junes RD, LDN, CNSC Clinical Nutrition Available Tues-Sat via Pager: [phone] 02/15/2018 12:13 PM

## 2018-02-15 NOTE — Progress Notes (Signed)
PROGRESS NOTE    Todd [name]  ION:[id number] DOB: July 20, 1955 DOA: 02/12/2018 PCP: [name], MD   Brief Narrative:  Todd [name] a 62 y.o.malewith medical history significant ofCKD stage III due to IDDM on Novolin 70/30 25 units bid, hypothyroidism, gout, HLD, HTN, bilateral BKA in 2 different procedures, subsequent bilateral AKA, is admitted on 02/12/18 for hyperosmolar hyperglycemia syndrome with symptoms of polyuria, polydipsia, and polyphagia, as well as blurry vision. He acknowledged that he drinks a lot of soda. He also takes sweet stuffs as ice cream and popsicles. Denies missing insulin dose at home. No evidence of infection. Associated with HHS is hyponatremia and hypokalemia. Hypokalemia is corrected with potassium supplement. Hyponatremia improves with IVF. With insulin drip and IVF, HHS resolved. Increased Novolin 70/30 to 30 units bid. Pt is noncompliant to diet and eats way too much gram crackers which resulted in hyperglycemia >400, received insulin drip again on 9/4 night. Ordered nursing communication that unless pt is hypoglycemic, no extra snacks allowed. Pt will need referral to see Dr. Dorris Fetch on d/c   Assessment & Plan:   Principal Problem:   Hyperglycemia due to type 2 diabetes mellitus ([place]) Active Problems:   HTN (hypertension)   Hypothyroidism   DM type 2, uncontrolled, with renal complications ([place])   Tobacco abuse   S/P AKA (above knee amputation) bilateral ([place])   CKD stage 3 due to type 2 diabetes mellitus ([place])   Hyponatremia   Medically noncompliant   Plan -can transfer out of ICU today  -Na improved to 135, d/c IVF -stop insulin drip 2 hrs post resume Novolin 70/30 30 units, continue SSI, consistent carb diet; pt is noncompliant; ordered nursing communication that unless pt is hypoglycemic, no extra snacks allowed.  -AKI resolved, back to CKD 3 baseline Cr -replete potassium -nicotine patch and smoking cessation education -BP  controlled -levothyroxine daily -possible dc in am -Pt will need referral to see Dr. Dorris Fetch on d/c   DVT prophylaxis: lovenox Code Status: full Family Communication: no family at bedside Disposition Plan: home   Consultants:   Diabetic educator  Procedures: none  Antimicrobials: none   Subjective: Too much gram crackers yesterday evening, BS >400 at bedtime, nocturnist restarted him on insulin drip, BS improved to high 100's to low 200's this morning.    Objective: Vitals:   02/15/18 0500 02/15/18 0600 02/15/18 0700 02/15/18 0758  BP: (!) 164/84 125/74 117/64   Pulse: 68 70 (!) 36   Resp: 13 13 17  (!) 21  Temp:    98.5 F (36.9 C)  TempSrc:    Oral  SpO2: 100% 100% 99%   Weight:        Intake/Output Summary (Last 24 hours) at 02/15/2018 1209 Last data filed at 02/15/2018 1100 Gross per 24 hour  Intake 1537.41 ml  Output 1425 ml  Net 112.41 ml   Filed Weights   02/13/18 0500 02/14/18 0500 02/15/18 0400  Weight: 57 kg 57.3 kg 61.2 kg    Examination:  General exam: Appears calm and comfortable  Respiratory system: Clear to auscultation. Respiratory effort normal. Cardiovascular system: S1 & S2 heard, RRR. No JVD, murmurs, rubs, gallops or clicks. No pedal edema. Gastrointestinal system: Abdomen is nondistended, soft and nontender. No organomegaly or masses felt. Normal bowel sounds heard. Central nervous system: Alert and oriented. No focal neurological deficits. Extremities: bilateral AKA Skin: No rashes, lesions or ulcers Psychiatry: Judgement and insight appear normal. Mood & affect appropriate.     Data Reviewed: I have  personally reviewed following labs and imaging studies  CBC: Recent Labs  Lab 02/09/18 1222 02/12/18 1841 02/13/18 0829 02/14/18 0408  WBC 9.6 10.5 12.9* 11.3*  NEUTROABS 6.5  --  8.4*  --   HGB 12.7* 13.7 11.5* 11.6*  HCT 39.6 42.5 33.7* 33.7*  MCV 91 94.0 85.5 84.9  PLT 500* 509* 489* 650*   Basic Metabolic Panel: Recent  Labs  Lab 02/12/18 2333  02/13/18 0711  02/14/18 0408 02/15/18 0335 02/15/18 0412 02/15/18 0712 02/15/18 1139  NA 131*   < > 130*   < > 132* 127* 131* 135 135  K 3.6   < > 3.6   < > 3.7 3.1* 3.1* 3.2* 3.4*  CL 101   < > 102   < > 108 105 104 107 104  CO2 18*   < > 19*   < > 17* 17* 17* 17* 21*  GLUCOSE 657*   < > 232*   < > 285* 497* 464* 187* 64*  BUN 18   < > 16   < > 13 12 11 10 9   CREATININE 1.77*   < > 1.52*   < > 1.32* 1.26* 1.25* 1.23 1.21  CALCIUM 7.3*   < > 7.2*   < > 7.4* 6.7* 7.0* 7.2* 7.5*  MG 1.7  --   --   --  1.8  --   --   --   --   PHOS 2.7  --  1.9*  --  2.3*  --   --   --   --    < > = values in this interval not displayed.   GFR: Estimated Creatinine Clearance: 54.8 mL/min (by C-G formula based on SCr of 1.21 mg/dL). Liver Function Tests: Recent Labs  Lab 02/09/18 1222 02/12/18 2333  AST 17 23  ALT 13 20  ALKPHOS 141* 132*  BILITOT 0.4 0.8  PROT 7.7 8.0  ALBUMIN 3.3* 2.9*   No results for input(s): LIPASE, AMYLASE in the last 168 hours. No results for input(s): AMMONIA in the last 168 hours. Coagulation Profile: No results for input(s): INR, PROTIME in the last 168 hours. Cardiac Enzymes: Recent Labs  Lab 02/12/18 2333 02/13/18 0220 02/13/18 0711  TROPONINI 0.04* 0.04* 0.04*   BNP (last 3 results) No results for input(s): PROBNP in the last 8760 hours. HbA1C: No results for input(s): HGBA1C in the last 72 hours. CBG: Recent Labs  Lab 02/15/18 0718 02/15/18 0828 02/15/18 0930 02/15/18 1040 02/15/18 1142  GLUCAP 230* 94 185* 139* 60*   Lipid Profile: No results for input(s): CHOL, HDL, LDLCALC, TRIG, CHOLHDL, LDLDIRECT in the last 72 hours. Thyroid Function Tests: No results for input(s): TSH, T4TOTAL, FREET4, T3FREE, THYROIDAB in the last 72 hours. Anemia Panel: No results for input(s): VITAMINB12, FOLATE, FERRITIN, TIBC, IRON, RETICCTPCT in the last 72 hours. Sepsis Labs: No results for input(s): PROCALCITON, LATICACIDVEN in the  last 168 hours.  No results found for this or any previous visit (from the past 240 hour(s)).       Radiology Studies: No results found.      Scheduled Meds: . carvedilol  6.25 mg Oral BID WC  . enoxaparin (LOVENOX) injection  40 mg Subcutaneous Q24H  . folic acid  1 mg Oral Daily  . insulin aspart  0-15 Units Subcutaneous TID WC  . insulin aspart  0-5 Units Subcutaneous QHS  . insulin aspart protamine- aspart  30 Units Subcutaneous BID WC  . levothyroxine  75 mcg  Oral QAC breakfast  . thiamine  100 mg Oral Daily   Continuous Infusions: . sodium chloride    . sodium chloride 75 mL/hr at 02/15/18 0457  . dextrose 5 % and 0.45% NaCl Stopped (02/15/18 1042)  . insulin (NOVOLIN-R) infusion Stopped (02/15/18 1042)     LOS: 3 days    Time spent: 25 min    Paticia Stack, MD [facility] Pager [phone]  If 7PM-7AM, please contact night-coverage [email or web address] Password [email or web address] 02/15/2018, 12:09 PM

## 2018-02-15 NOTE — Progress Notes (Signed)
PT Cancellation Note  Patient Details Name: Todd [name] MRN: [id number] DOB: 1956-03-30   Cancelled Treatment:    Reason Eval/Treat Not Completed: Other (comment).  Pt was able to get up to chair with nursing and declined further therapy with PT.   Ramond Dial 02/15/2018, 10:57 AM   10:58 AM, 02/15/18 Mee Hives, PT, MS Physical Therapist - [place] [phone] (Office)

## 2018-02-15 NOTE — Progress Notes (Signed)
Hypoglycemia event. CBG 60 snack provided. Will continue to monitor and recheck blood sugar.

## 2018-02-16 ENCOUNTER — Ambulatory Visit: Payer: Medicaid Other | Admitting: Family Medicine

## 2018-02-16 DIAGNOSIS — Z794 Long term (current) use of insulin: Secondary | ICD-10-CM

## 2018-02-16 DIAGNOSIS — E1100 Type 2 diabetes mellitus with hyperosmolarity without nonketotic hyperglycemic-hyperosmolar coma (NKHHC): Principal | ICD-10-CM

## 2018-02-16 DIAGNOSIS — Z89611 Acquired absence of right leg above knee: Secondary | ICD-10-CM

## 2018-02-16 DIAGNOSIS — E1165 Type 2 diabetes mellitus with hyperglycemia: Secondary | ICD-10-CM

## 2018-02-16 DIAGNOSIS — E876 Hypokalemia: Secondary | ICD-10-CM

## 2018-02-16 DIAGNOSIS — E871 Hypo-osmolality and hyponatremia: Secondary | ICD-10-CM

## 2018-02-16 DIAGNOSIS — E1122 Type 2 diabetes mellitus with diabetic chronic kidney disease: Secondary | ICD-10-CM

## 2018-02-16 DIAGNOSIS — Z9114 Patient's other noncompliance with medication regimen: Secondary | ICD-10-CM

## 2018-02-16 DIAGNOSIS — E039 Hypothyroidism, unspecified: Secondary | ICD-10-CM

## 2018-02-16 DIAGNOSIS — I10 Essential (primary) hypertension: Secondary | ICD-10-CM

## 2018-02-16 DIAGNOSIS — Z89612 Acquired absence of left leg above knee: Secondary | ICD-10-CM

## 2018-02-16 DIAGNOSIS — N183 Chronic kidney disease, stage 3 (moderate): Secondary | ICD-10-CM

## 2018-02-16 LAB — BASIC METABOLIC PANEL
Anion gap: 7 (ref 5–15)
BUN: 9 mg/dL (ref 8–23)
CO2: 21 mmol/L — ABNORMAL LOW (ref 22–32)
Calcium: 7.4 mg/dL — ABNORMAL LOW (ref 8.9–10.3)
Chloride: 108 mmol/L (ref 98–111)
Creatinine, Ser: 1.17 mg/dL (ref 0.61–1.24)
GFR calc Af Amer: >60 mL/min (ref >60)
GLUCOSE: 108 mg/dL — AB (ref 70–99)
Potassium: 3.1 mmol/L — ABNORMAL LOW (ref 3.5–5.1)
Sodium: 136 mmol/L (ref 135–145)

## 2018-02-16 LAB — GLUCOSE, CAPILLARY
Glucose-Capillary: 148 mg/dL — ABNORMAL HIGH (ref 70–99)
Glucose-Capillary: 155 mg/dL — ABNORMAL HIGH (ref 70–99)
Glucose-Capillary: 160 mg/dL — ABNORMAL HIGH (ref 70–99)

## 2018-02-16 MED ORDER — INSULIN ASPART PROT & ASPART (70-30 MIX) 100 UNIT/ML ~~LOC~~ SUSP: 30.0000 [IU] | Freq: Two times a day (BID) | SUBCUTANEOUS | 11 refills | Status: DC

## 2018-02-16 MED ORDER — INSULIN ASPART 100 UNIT/ML ~~LOC~~ SOLN: 0.0000 [IU] | Freq: Three times a day (TID) | SUBCUTANEOUS | 11 refills | Status: DC

## 2018-02-16 MED ORDER — POTASSIUM CHLORIDE ER 10 MEQ PO TBCR: 10.0000 meq | EXTENDED_RELEASE_TABLET | Freq: Every day | ORAL | 0 refills | Status: DC

## 2018-02-16 MED ORDER — POTASSIUM CHLORIDE CRYS ER 20 MEQ PO TBCR
40.0000 meq | EXTENDED_RELEASE_TABLET | Freq: Two times a day (BID) | ORAL | Status: DC
  Administered 2018-02-16: 40 meq via ORAL
  Filled 2018-02-16: qty 2

## 2018-02-16 NOTE — Discharge Summary (Signed)
Physician Discharge Summary  Todd [name] CHE:[id number] DOB: 01-29-1956 DOA: 02/12/2018  PCP: [name], MD  Admit date: 02/12/2018 Discharge date: 02/16/2018  Admitted From: Home.  Disposition:  Home.   Recommendations for Outpatient Follow-up:  1. Follow up with PCP in 1-2 weeks 2. Please obtain BMP/CBC in one week Please follow up with endocrinology as recommended.    Discharge Condition:stable.  CODE STATUS: full code.  Diet recommendation: Heart Healthy / Carb Modified   Brief/Interim Summary: Todd [name] a 62 y.o.malewith medical history significant ofCKD stage III due to IDDM on Novolin 70/30 25 units bid, hypothyroidism, gout, HLD, HTN, bilateral BKA in 2 different procedures, subsequent bilateral AKA, is admitted on 02/12/18 for hyperosmolar hyperglycemia syndrome with symptoms of polyuria, polydipsia, and polyphagia, as well as blurry vision. Pt is noncompliant to diet and eats way too much gram crackers which resulted in hyperglycemia >400, received insulin drip again on 9/4 night.   Discharge Diagnoses:  Principal Problem:   Hyperglycemia due to type 2 diabetes mellitus ([place]) Active Problems:   HTN (hypertension)   Hypothyroidism   DM type 2, uncontrolled, with renal complications (HCC)   Tobacco abuse   S/P AKA (above knee amputation) bilateral ([place])   CKD stage 3 due to type 2 diabetes mellitus ([place])   Hyponatremia   Medically noncompliant     Uncontrolled Diabetes Mellitus with hyperglycemia:  Secondary to non compliance to medications and diet.  CBG (last 3)  Recent Labs    02/15/18 1657 02/15/18 2111 02/16/18 0816  GLUCAP 166* 202* 148*    He was started on insulin gtt, and transitioned to novolog 70/30 BID.  Resume SSI, increase Novolog 70/30 to 30 units BID.    Hypertension; well controlled.  Resume coreg.    Tobacco abuse:  Counseled .    Stage 3 CKD:  Creatinine at baseline.    Hypothyroidism:  Resume synthroid.        Discharge Instructions  Discharge Instructions    Diet - low sodium heart healthy   Complete by:  As directed    Discharge instructions   Complete by:  As directed    Follow up with PCP in one week.  Please follow up with Dr Dorris Fetch as recommended.     Allergies as of 02/16/2018      Reactions   Lisinopril Other (See Comments)   Hyperkalemia / Renal failure      Medication List    STOP taking these medications   insulin aspart protamine - aspart (70-30) 100 UNIT/ML FlexPen Commonly known as:  NOVOLOG 70/30 MIX Replaced by:  insulin aspart protamine- aspart (70-30) 100 UNIT/ML injection     TAKE these medications   atorvastatin 40 MG tablet Commonly known as:  LIPITOR Take 1 tablet (40 mg total) by mouth daily at 6 PM.   blood glucose meter kit and supplies Dispense based on patient and insurance preference. Use up to four times daily as directed. (FOR ICD-10 E10.9, E11.9).   carvedilol 3.125 MG tablet Commonly known as:  COREG Take 1 tablet (3.125 mg total) by mouth 2 (two) times daily with a meal.   folic acid 1 MG tablet Commonly known as:  FOLVITE Take 1 tablet (1 mg total) by mouth daily.   insulin aspart 100 UNIT/ML injection Commonly known as:  novoLOG Inject 0-15 Units into the skin 3 (three) times daily with meals. CBG 70 - 120: 0 units CBG 121 - 150: 2 units CBG 151 - 200: 3 units CBG  201 - 250: 5 units CBG 251 - 300: 8 units CBG 301 - 350: 11 units CBG 351 - 400: 15 units   insulin aspart protamine- aspart (70-30) 100 UNIT/ML injection Commonly known as:  NOVOLOG MIX 70/30 Inject 0.3 mLs (30 Units total) into the skin 2 (two) times daily with a meal. Replaces:  insulin aspart protamine - aspart (70-30) 100 UNIT/ML FlexPen   Insulin Pen Needle 31G X 5 MM Misc 1 Device by Does not apply route as directed.   levothyroxine 75 MCG tablet Commonly known as:  SYNTHROID, LEVOTHROID Take 1 tablet (75 mcg total) by mouth daily before breakfast.    potassium chloride 10 MEQ tablet Commonly known as:  K-DUR Take 1 tablet (10 mEq total) by mouth daily for 5 days.   thiamine 100 MG tablet Take 1 tablet (100 mg total) by mouth daily.      Follow-up Information    [name], [name], MD. Schedule an appointment as soon as possible for a visit in 1 week(s).   Specialty:  Family Medicine Contact information: [address] [place] [address] [phone]          Allergies  Allergen Reactions  . Lisinopril Other (See Comments)    Hyperkalemia / Renal failure    Consultations:  None.    Procedures/Studies: Portable Chest X-ray (1 View)  Result Date: 02/04/2018 CLINICAL DATA:  Diabetic ketoacidosis EXAM: PORTABLE CHEST 1 VIEW COMPARISON:  08/31/2016 chest radiograph. FINDINGS: Surgical clips overlie medial lower neck bilaterally. Stable cardiomediastinal silhouette with normal heart size. No pneumothorax. No pleural effusion. No pulmonary edema. Curvilinear opacities at the right greater than left lung bases. IMPRESSION: Curvilinear bibasilar lung opacities, right greater than left, favor atelectasis. Electronically Signed   By: Ilona Sorrel M.D.   On: 02/04/2018 07:54       Subjective: No chest pain or sob, no nausea, vomiting or abdominal pain.   Discharge Exam: Vitals:   02/16/18 0900 02/16/18 1000  BP: (!) 143/80 (!) 143/49  Pulse: (!) 57 71  Resp: 15 15  Temp:    SpO2: 93% 100%   Vitals:   02/16/18 0830 02/16/18 0831 02/16/18 0900 02/16/18 1000  BP:   (!) 143/80 (!) 143/49  Pulse:  67 (!) 57 71  Resp:   15 15  Temp: 98.5 F (36.9 C)     TempSrc: Oral     SpO2:   93% 100%  Weight:        General: Pt is alert, awake, not in acute distress Cardiovascular: RRR, S1/S2 +, no rubs, no gallops Respiratory: CTA bilaterally, no wheezing, no rhonchi Abdominal: Soft, NT, ND, bowel sounds + Extremities: no edema, no cyanosis    The results of significant diagnostics from this hospitalization (including  imaging, microbiology, ancillary and laboratory) are listed below for reference.     Microbiology: No results found for this or any previous visit (from the past 240 hour(s)).   Labs: BNP (last 3 results) No results for input(s): BNP in the last 8760 hours. Basic Metabolic Panel: Recent Labs  Lab 02/12/18 2333  02/13/18 0711  02/14/18 0408 02/15/18 0335 02/15/18 0412 02/15/18 0712 02/15/18 1139 02/16/18 0408  NA 131*   < > 130*   < > 132* 127* 131* 135 135 136  K 3.6   < > 3.6   < > 3.7 3.1* 3.1* 3.2* 3.4* 3.1*  CL 101   < > 102   < > 108 105 104 107 104 108  CO2 18*   < > 19*   < > 17* 17* 17* 17* 21* 21*  GLUCOSE 657*   < > 232*   < > 285* 497* 464* 187* 64* 108*  BUN 18   < > 16   < > _0 CREATININE 1.77*   < > 1.52*   < > 1.32* 1.26* 1.25* 1.23 1.21 1.17  CALCIUM 7.3*   < > 7.2*   < > 7.4* 6.7* 7.0* 7.2* 7.5* 7.4*  MG 1.7  --   --   --  1.8  --   --   --   --   --   PHOS 2.7  --  1.9*  --  2.3*  --   --   --   --   --    < > = values in this interval not displayed.   Liver Function Tests: Recent Labs  Lab 02/09/18 1222 02/12/18 2333  AST 17 23  ALT 13 20  ALKPHOS 141* 132*  BILITOT 0.4 0.8  PROT 7.7 8.0  ALBUMIN 3.3* 2.9*   No results for input(s): LIPASE, AMYLASE in the last 168 hours. No results for input(s): AMMONIA in the last 168 hours. CBC: Recent Labs  Lab 02/09/18 1222 02/12/18 1841 02/13/18 0829 02/14/18 0408  WBC 9.6 10.5 12.9* 11.3*  NEUTROABS 6.5  --  8.4*  --   HGB 12.7* 13.7 11.5* 11.6*  HCT 39.6 42.5 33.7* 33.7*  MCV 91 94.0 85.5 84.9  PLT 500* 509* 489* 469*   Cardiac Enzymes: Recent Labs  Lab 02/12/18 2333 02/13/18 0220 02/13/18 0711  TROPONINI 0.04* 0.04* 0.04*   BNP: Invalid input(s): POCBNP CBG: Recent Labs  Lab 02/15/18 1142 02/15/18 1219 02/15/18 1657 02/15/18 2111 02/16/18 0816  GLUCAP 60* 83 166* 202* 148*   D-Dimer No results for input(s): DDIMER in the last 72 hours. Hgb A1c No results for  input(s): HGBA1C in the last 72 hours. Lipid Profile No results for input(s): CHOL, HDL, LDLCALC, TRIG, CHOLHDL, LDLDIRECT in the last 72 hours. Thyroid function studies No results for input(s): TSH, T4TOTAL, T3FREE, THYROIDAB in the last 72 hours.  Invalid input(s): FREET3 Anemia work up No results for input(s): VITAMINB12, FOLATE, FERRITIN, TIBC, IRON, RETICCTPCT in the last 72 hours. Urinalysis    Component Value Date/Time   COLORURINE COLORLESS (A) 02/12/2018 1744   APPEARANCEUR CLEAR 02/12/2018 1744   APPEARANCEUR Clear 02/09/2018 1352   LABSPEC 1.026 02/12/2018 1744   PHURINE 6.0 02/12/2018 1744   GLUCOSEU >=500 (A) 02/12/2018 1744   HGBUR NEGATIVE 02/12/2018 1744   BILIRUBINUR NEGATIVE 02/12/2018 1744   BILIRUBINUR Negative 02/09/2018 1352   KETONESUR 5 (A) 02/12/2018 1744   PROTEINUR NEGATIVE 02/12/2018 1744   NITRITE NEGATIVE 02/12/2018 1744   LEUKOCYTESUR NEGATIVE 02/12/2018 1744   LEUKOCYTESUR Negative 02/09/2018 1352   Sepsis Labs Invalid input(s): PROCALCITONIN,  WBC,  LACTICIDVEN Microbiology No results found for this or any previous visit (from the past 240 hour(s)).   Time coordinating discharge: 33  minutes  SIGNED:   Hosie Poisson, MD  [facility] 02/16/2018, 10:35 AM Pager   If 7PM-7AM, please contact night-coverage [email or web address] Password [email or web address]

## 2018-02-16 NOTE — Care Management Note (Signed)
Case Management Note  Patient Details  Name: Todd [name] MRN: [id number] Date of Birth: Mar 22, 1956  Subjective/Objective:  Hyperglycemia. Pt from home, ind with ADL's. Has insurance with drug coverage and PCP. Pt non-compliant with medications. Pt has strong family support.    Recommended for [facility] PT. Patient agreeable, would like [facility], had them in the past.             Action/Plan: DC home with [facility]. Vaughan Basta of [facility] notified and will obtain orders via Epic. Attending notified of need for [facility] orders. Patient would like PT and RN for disease management and ongoing DM education.  Expected Discharge Date:  02/16/18               Expected Discharge Plan:  [place]  In-House Referral:     Discharge planning Services  CM Consult  Post Acute Care Choice:  Home Health Choice offered to:  Patient  DME Arranged:    DME Agency:     HH Arranged:  PT, RN, Disease Management [place] Agency:     Status of Service:  Completed, signed off  If discussed at [place] of Stay Meetings, dates discussed:    Additional Comments:  Olumide Dolinger, Chauncey Reading, RN 02/16/2018, 3:39 PM

## 2018-02-16 NOTE — Progress Notes (Addendum)
Pt was discharged home with mother. Pt was stable and in no pain. Discharge information was given and taught to pt and mother. They both demonstrated acceptance and understanding.    Jeris Penta, RN

## 2018-02-18 ENCOUNTER — Encounter: Payer: Self-pay | Admitting: Family Medicine

## 2018-02-20 ENCOUNTER — Ambulatory Visit: Payer: Medicaid Other | Admitting: "Endocrinology

## 2018-02-22 ENCOUNTER — Telehealth: Payer: Self-pay | Admitting: Family Medicine

## 2018-02-23 ENCOUNTER — Other Ambulatory Visit: Payer: Self-pay

## 2018-02-23 ENCOUNTER — Telehealth: Payer: Self-pay | Admitting: Family Medicine

## 2018-02-23 DIAGNOSIS — E1165 Type 2 diabetes mellitus with hyperglycemia: Secondary | ICD-10-CM

## 2018-02-23 NOTE — Telephone Encounter (Signed)
States it was 500 last night  He has not checked it this morning. Sister aware that he needs to be seen  - she will try and reach him and let him know. We may see him here and or send him to ER Sister will call me back

## 2018-02-23 NOTE — Telephone Encounter (Signed)
Wanting to know if we can get an earlier appt with Dr. Dorris Fetch, they rescheduled him for mid Oct. He did not have transportation for his appt this week

## 2018-02-23 NOTE — Telephone Encounter (Signed)
Patient sister called back and patient Sugar is at 503 this morning with his insulin. Patient sister was advised that he needs to go to the ER. She verbalized understanding, and was calling her mom to get him to the hospital.

## 2018-02-23 NOTE — Progress Notes (Unsigned)
endo

## 2018-02-26 ENCOUNTER — Telehealth: Payer: Self-pay | Admitting: *Deleted

## 2018-02-26 NOTE — Telephone Encounter (Signed)
He has a sliding scale. That would mean taking 11 units for this reading.

## 2018-02-26 NOTE — Telephone Encounter (Signed)
BS is 391, please advise

## 2018-02-27 NOTE — Telephone Encounter (Signed)
Patient aware.

## 2018-03-01 ENCOUNTER — Telehealth: Payer: Self-pay | Admitting: *Deleted

## 2018-03-01 MED ORDER — INSULIN ASPART 100 UNIT/ML FLEXPEN: 0.0000 [IU] | PEN_INJECTOR | Freq: Three times a day (TID) | SUBCUTANEOUS | 2 refills | Status: DC

## 2018-03-01 MED ORDER — INSULIN ASPART PROT & ASPART (70-30 MIX) 100 UNIT/ML PEN: 30.0000 [IU] | PEN_INJECTOR | Freq: Two times a day (BID) | SUBCUTANEOUS | 2 refills | Status: DC

## 2018-03-01 NOTE — Telephone Encounter (Signed)
VM received from [place] with [facility], pt is having trouble himself his short acting insulin from the vial, he is unable to see the syringe and has to wait for his mom. He has no issue giving his long acting with the flexpen. Can this be changed to the flexpen as well. Rxs sent in to [facility].    Short acting printed and will be given to patient at appt on 03/02/18

## 2018-03-02 ENCOUNTER — Encounter: Payer: Self-pay | Admitting: Family Medicine

## 2018-03-02 ENCOUNTER — Ambulatory Visit (INDEPENDENT_AMBULATORY_CARE_PROVIDER_SITE_OTHER): Payer: Medicaid Other | Admitting: Family Medicine

## 2018-03-02 VITALS — BP 162/70 | HR 68 | Temp 97.6°F

## 2018-03-02 DIAGNOSIS — E1165 Type 2 diabetes mellitus with hyperglycemia: Secondary | ICD-10-CM

## 2018-03-02 DIAGNOSIS — Z794 Long term (current) use of insulin: Secondary | ICD-10-CM

## 2018-03-02 LAB — BMP8+EGFR
BUN/Creatinine Ratio: 12 (ref 10–24)
BUN: 13 mg/dL (ref 8–27)
CO2: 21 mmol/L (ref 20–29)
Calcium: 9.8 mg/dL (ref 8.6–10.2)
Chloride: 97 mmol/L (ref 96–106)
Creatinine, Ser: 1.12 mg/dL (ref 0.76–1.27)
GFR, EST AFRICAN AMERICAN: 81 mL/min/{1.73_m2} (ref >59)
GFR, EST NON AFRICAN AMERICAN: 70 mL/min/{1.73_m2} (ref >59)
Glucose: 352 mg/dL — ABNORMAL HIGH (ref 65–99)
POTASSIUM: 4.5 mmol/L (ref 3.5–5.2)
Sodium: 131 mmol/L — ABNORMAL LOW (ref 134–144)

## 2018-03-02 MED ORDER — INSULIN ASPART PROT & ASPART (70-30 MIX) 100 UNIT/ML PEN: 40.0000 [IU] | PEN_INJECTOR | Freq: Two times a day (BID) | SUBCUTANEOUS | 2 refills | Status: DC

## 2018-03-04 ENCOUNTER — Encounter: Payer: Self-pay | Admitting: Family Medicine

## 2018-03-04 NOTE — Progress Notes (Signed)
Subjective:  Patient ID: Todd [name], male    DOB: [birth year]  Age: 62 y.o. MRN: [id number]  CC: Hospitalization Follow-up   HPI Todd [name] presents for hospital follow up. Glucose running over 200. Started on Novolog 70/30 while hospitalized. Also on sliding scale. Pt. Expresses understanding of his regimen. No further highs. Denies low glucose rxns.  Depression screen [facility] 2/9 03/02/2018 02/09/2018 08/15/2017  Decreased Interest 0 0 0  Down, Depressed, Hopeless 0 0 0  PHQ - 2 Score 0 0 0    History Todd [name] has a past medical history of AKI (acute kidney injury) ([place]), Constipated, Diabetes mellitus without complication ([place]), Diarrhea, Elevated LFTs, Goiter, Gout, Hyperlipidemia, Hypertension, Leukocytosis, Reactive thrombocytosis, Right BKA infection ([place]) (08/2016), Right leg pain, Sepsis due to undetermined organism [facility]), Thyroid disease, and Wound infection after surgery (08/2016).   He has a past surgical history that includes Thyroid surgery; Lower Extremity Angiography (Bilateral, 08/11/2016); ABDOMINAL AORTOGRAM (N/A, 08/11/2016); PERIPHERAL VASCULAR BALLOON ANGIOPLASTY (Left, 08/11/2016); ABDOMINAL AORTOGRAM W/LOWER EXTREMITY (N/A, 08/15/2016); Amputation (Right, 08/17/2016); Amputation (Right, 09/12/2016); Application if wound vac (Right, 09/12/2016); Amputation (Left, 08/12/2016); and Amputation (Left, 11/01/2017).   His family history includes Diabetes in his maternal aunt and maternal uncle; Heart disease in his mother; Pneumonia in his father.He reports that he has been smoking cigarettes. He has a 33.75 pack-year smoking history. He has never used smokeless tobacco. He reports that he drinks alcohol. He reports that he does not use drugs.    ROS Review of Systems  Constitutional: Negative for fever.  Respiratory: Negative for shortness of breath.   Cardiovascular: Negative for chest pain.  Musculoskeletal: Negative for arthralgias.  Skin: Negative for rash.    Objective:  BP  (!) 162/70   Pulse 68   Temp 97.6 F (36.4 C) (Oral)   BP Readings from Last 3 Encounters:  03/02/18 (!) 162/70  02/16/18 109/77  02/09/18 134/73    Wt Readings from Last 3 Encounters:  02/15/18 134 lb 14.7 oz (61.2 kg)  02/05/18 138 lb 14.2 oz (63 kg)  11/30/17 145 lb (65.8 kg)     Physical Exam  Constitutional: He is oriented to person, place, and time. He appears well-developed and well-nourished. No distress.  HENT:  Head: Normocephalic and atraumatic.  Right Ear: External ear normal.  Left Ear: External ear normal.  Nose: Nose normal.  Mouth/Throat: Oropharynx is clear and moist.  Eyes: Pupils are equal, round, and reactive to light. Conjunctivae and EOM are normal.  Neck: Normal range of motion. Neck supple.  Cardiovascular: Normal rate, regular rhythm and normal heart sounds.  No murmur heard. Pulmonary/Chest: Effort normal and breath sounds normal. No respiratory distress. He has no wheezes. He has no rales.  Abdominal: Soft. There is no tenderness.  Musculoskeletal: Normal range of motion.  Neurological: He is alert and oriented to person, place, and time. He has normal reflexes.  Skin: Skin is warm and dry.  Psychiatric: He has a normal mood and affect. His behavior is normal. Judgment and thought content normal.      Assessment & Plan:   Todd [name] was seen today for hospitalization follow-up.  Diagnoses and all orders for this visit:  Type 2 diabetes mellitus with hyperglycemia, with long-term current use of insulin (HCC) -     BMP8+EGFR  Other orders -     insulin aspart protamine - aspart (NOVOLOG MIX 70/30 FLEXPEN) (70-30) 100 UNIT/ML FlexPen; Inject 0.4 mLs (40 Units total) into the skin 2 (two) times daily with  a meal.       I have discontinued Simcha Stucke's potassium chloride. I have also changed his insulin aspart protamine - aspart. Additionally, I am having him maintain his levothyroxine, atorvastatin, carvedilol, Insulin Pen Needle,  blood glucose meter kit and supplies, folic acid, thiamine, and insulin aspart.  Allergies as of 03/02/2018      Reactions   Lisinopril Other (See Comments)   Hyperkalemia / Renal failure      Medication List        Accurate as of 03/02/18 11:59 PM. Always use your most recent med list.          atorvastatin 40 MG tablet Commonly known as:  LIPITOR Take 1 tablet (40 mg total) by mouth daily at 6 PM.   blood glucose meter kit and supplies Dispense based on patient and insurance preference. Use up to four times daily as directed. (FOR ICD-10 E10.9, E11.9).   carvedilol 3.125 MG tablet Commonly known as:  COREG Take 1 tablet (3.125 mg total) by mouth 2 (two) times daily with a meal.   folic acid 1 MG tablet Commonly known as:  FOLVITE Take 1 tablet (1 mg total) by mouth daily.   insulin aspart 100 UNIT/ML FlexPen Commonly known as:  NOVOLOG Inject 0-15 Units into the skin 3 (three) times daily with meals. CBG 70 - 120: 0 units CBG 121 - 150: 2 units CBG 151 - 200: 3 units CBG 201 - 250: 5 units CBG 251 - 300: 8 units CBG 301 - 350: 11 units CBG 351 - 400: 15 units   insulin aspart protamine - aspart (70-30) 100 UNIT/ML FlexPen Commonly known as:  NOVOLOG 70/30 MIX Inject 0.4 mLs (40 Units total) into the skin 2 (two) times daily with a meal.   Insulin Pen Needle 31G X 5 MM Misc 1 Device by Does not apply route as directed.   levothyroxine 75 MCG tablet Commonly known as:  SYNTHROID, LEVOTHROID Take 1 tablet (75 mcg total) by mouth daily before breakfast.   thiamine 100 MG tablet Take 1 tablet (100 mg total) by mouth daily.        Follow-up: Return in about 2 weeks (around 03/16/2018).  [name], M.D.

## 2018-03-06 ENCOUNTER — Telehealth (INDEPENDENT_AMBULATORY_CARE_PROVIDER_SITE_OTHER): Payer: Self-pay | Admitting: Orthopedic Surgery

## 2018-03-06 ENCOUNTER — Ambulatory Visit (INDEPENDENT_AMBULATORY_CARE_PROVIDER_SITE_OTHER): Payer: Medicaid Other

## 2018-03-06 DIAGNOSIS — I129 Hypertensive chronic kidney disease with stage 1 through stage 4 chronic kidney disease, or unspecified chronic kidney disease: Secondary | ICD-10-CM | POA: Diagnosis not present

## 2018-03-06 DIAGNOSIS — N183 Chronic kidney disease, stage 3 (moderate): Secondary | ICD-10-CM

## 2018-03-06 DIAGNOSIS — Z89612 Acquired absence of left leg above knee: Secondary | ICD-10-CM

## 2018-03-06 DIAGNOSIS — E1122 Type 2 diabetes mellitus with diabetic chronic kidney disease: Secondary | ICD-10-CM | POA: Diagnosis not present

## 2018-03-06 DIAGNOSIS — E1142 Type 2 diabetes mellitus with diabetic polyneuropathy: Secondary | ICD-10-CM

## 2018-03-06 DIAGNOSIS — Z794 Long term (current) use of insulin: Secondary | ICD-10-CM

## 2018-03-06 DIAGNOSIS — E1165 Type 2 diabetes mellitus with hyperglycemia: Secondary | ICD-10-CM

## 2018-03-06 DIAGNOSIS — M109 Gout, unspecified: Secondary | ICD-10-CM

## 2018-03-06 DIAGNOSIS — L89322 Pressure ulcer of left buttock, stage 2: Secondary | ICD-10-CM | POA: Diagnosis not present

## 2018-03-06 DIAGNOSIS — F17210 Nicotine dependence, cigarettes, uncomplicated: Secondary | ICD-10-CM

## 2018-03-06 DIAGNOSIS — E039 Hypothyroidism, unspecified: Secondary | ICD-10-CM

## 2018-03-06 DIAGNOSIS — Z89611 Acquired absence of right leg above knee: Secondary | ICD-10-CM

## 2018-03-06 DIAGNOSIS — Z9181 History of falling: Secondary | ICD-10-CM

## 2018-03-06 DIAGNOSIS — E785 Hyperlipidemia, unspecified: Secondary | ICD-10-CM

## 2018-03-06 NOTE — Telephone Encounter (Signed)
Juliann Pulse from [employer] called requesting a prescription for PT for his prosthetic legs.  Fax: [phone] and CB#[phone].  Thank you.

## 2018-03-06 NOTE — Telephone Encounter (Signed)
Can you please write a prescription and give it to myself or Star to fax to [facility]? Thanks.

## 2018-03-06 NOTE — Telephone Encounter (Signed)
Patient RX for prosthesis was sent today to [facility]. FYI

## 2018-03-08 NOTE — Telephone Encounter (Signed)
Thank you :)

## 2018-03-09 ENCOUNTER — Ambulatory Visit: Payer: Medicaid Other | Admitting: Family Medicine

## 2018-03-09 ENCOUNTER — Encounter: Payer: Self-pay | Admitting: Family Medicine

## 2018-03-09 ENCOUNTER — Other Ambulatory Visit: Payer: Self-pay | Admitting: Family Medicine

## 2018-03-09 VITALS — BP 157/85 | HR 65 | Temp 97.1°F | Ht 68.0 in

## 2018-03-09 DIAGNOSIS — Z794 Long term (current) use of insulin: Secondary | ICD-10-CM

## 2018-03-09 DIAGNOSIS — Z23 Encounter for immunization: Secondary | ICD-10-CM | POA: Diagnosis not present

## 2018-03-09 DIAGNOSIS — E1165 Type 2 diabetes mellitus with hyperglycemia: Secondary | ICD-10-CM

## 2018-03-09 LAB — CMP14+EGFR
ALBUMIN: 4.2 g/dL (ref 3.6–4.8)
ALK PHOS: 174 IU/L — AB (ref 39–117)
ALT: 21 IU/L (ref 0–44)
AST: 25 IU/L (ref 0–40)
Albumin/Globulin Ratio: 0.9 — ABNORMAL LOW (ref 1.2–2.2)
BILIRUBIN TOTAL: 0.3 mg/dL (ref 0.0–1.2)
BUN/Creatinine Ratio: 11 (ref 10–24)
BUN: 14 mg/dL (ref 8–27)
CHLORIDE: 98 mmol/L (ref 96–106)
CO2: 19 mmol/L — AB (ref 20–29)
CREATININE: 1.33 mg/dL — AB (ref 0.76–1.27)
Calcium: 9.7 mg/dL (ref 8.6–10.2)
GFR calc Af Amer: 66 mL/min/{1.73_m2} (ref >59)
GFR calc non Af Amer: 57 mL/min/{1.73_m2} — ABNORMAL LOW (ref >59)
GLUCOSE: 383 mg/dL — AB (ref 65–99)
Globulin, Total: 4.5 g/dL (ref 1.5–4.5)
Potassium: 4.9 mmol/L (ref 3.5–5.2)
Sodium: 136 mmol/L (ref 134–144)
Total Protein: 8.7 g/dL — ABNORMAL HIGH (ref 6.0–8.5)

## 2018-03-09 LAB — GLUCOSE HEMOCUE WAIVED: Glu Hemocue Waived: >444 mg/dL (ref 65–99)

## 2018-03-09 MED ORDER — GLUCOSE BLOOD VI STRP: ORAL_STRIP | 3 refills | Status: DC

## 2018-03-09 NOTE — Progress Notes (Signed)
Subjective:  Patient ID: Todd [name], male    DOB: 25-May-1956  Age: 62 y.o. MRN: [id number]  CC: Diabetes (pt here today for 1 week follow up for his diabetes)   HPI Jovane Foutz presents forFollow-up of diabetes. Patient checks blood sugar at home.  174 fasting and over 200 postprandial Patient denies symptoms such as polyuria, polydipsia, excessive hunger, nausea No significant hypoglycemic spells noted.   History [place] has a past medical history of AKI (acute kidney injury) ([place]), Constipated, Diabetes mellitus without complication ([place]), Diarrhea, Elevated LFTs, Goiter, Gout, Hyperlipidemia, Hypertension, Leukocytosis, Reactive thrombocytosis, Right BKA infection ([place]) (08/2016), Right leg pain, Sepsis due to undetermined organism [facility]), Thyroid disease, and Wound infection after surgery (08/2016).   He has a past surgical history that includes Thyroid surgery; Lower Extremity Angiography (Bilateral, 08/11/2016); ABDOMINAL AORTOGRAM (N/A, 08/11/2016); PERIPHERAL VASCULAR BALLOON ANGIOPLASTY (Left, 08/11/2016); ABDOMINAL AORTOGRAM W/LOWER EXTREMITY (N/A, 08/15/2016); Amputation (Right, 08/17/2016); Amputation (Right, 09/12/2016); Application if wound vac (Right, 09/12/2016); Amputation (Left, 08/12/2016); and Amputation (Left, 11/01/2017).   His family history includes Diabetes in his maternal aunt and maternal uncle; Heart disease in his mother; Pneumonia in his father.He reports that he has been smoking cigarettes. He has a 33.75 pack-year smoking history. He has never used smokeless tobacco. He reports that he drinks alcohol. He reports that he does not use drugs.  Current Outpatient Medications on File Prior to Visit  Medication Sig Dispense Refill  . atorvastatin (LIPITOR) 40 MG tablet Take 1 tablet (40 mg total) by mouth daily at 6 PM. 30 tablet 0  . blood glucose meter kit and supplies Dispense based on patient and insurance preference. Use up to four times daily as directed. (FOR ICD-10  E10.9, E11.9). 1 each 0  . carvedilol (COREG) 3.125 MG tablet Take 1 tablet (3.125 mg total) by mouth 2 (two) times daily with a meal. 60 tablet 0  . folic acid (FOLVITE) 1 MG tablet Take 1 tablet (1 mg total) by mouth daily.    . insulin aspart (NOVOLOG) 100 UNIT/ML FlexPen Inject 0-15 Units into the skin 3 (three) times daily with meals. CBG 70 - 120: 0 units CBG 121 - 150: 2 units CBG 151 - 200: 3 units CBG 201 - 250: 5 units CBG 251 - 300: 8 units CBG 301 - 350: 11 units CBG 351 - 400: 15 units 15 mL 2  . insulin aspart protamine - aspart (NOVOLOG MIX 70/30 FLEXPEN) (70-30) 100 UNIT/ML FlexPen Inject 0.4 mLs (40 Units total) into the skin 2 (two) times daily with a meal. 15 mL 2  . Insulin Pen Needle 31G X 5 MM MISC 1 Device by Does not apply route as directed. 100 each 1  . levothyroxine (SYNTHROID, LEVOTHROID) 75 MCG tablet Take 1 tablet (75 mcg total) by mouth daily before breakfast. 90 tablet 1  . thiamine 100 MG tablet Take 1 tablet (100 mg total) by mouth daily.     No current facility-administered medications on file prior to visit.     ROS Review of Systems  Constitutional: Negative.   HENT: Negative.   Eyes: Negative for visual disturbance.  Respiratory: Negative for cough and shortness of breath.   Cardiovascular: Negative for chest pain and leg swelling.  Gastrointestinal: Negative for abdominal pain, diarrhea, nausea and vomiting.  Genitourinary: Negative for difficulty urinating.  Skin: Negative for rash.  Neurological: Negative for headaches.  Psychiatric/Behavioral: Negative for sleep disturbance.    Objective:  BP (!) 157/85   Pulse 65  Temp (!) 97.1 F (36.2 C) (Oral)   Ht _0  (1.727 m)   BMI 20.51 kg/m   BP Readings from Last 3 Encounters:  03/09/18 (!) 157/85  03/02/18 (!) 162/70  02/16/18 109/77    Wt Readings from Last 3 Encounters:  02/15/18 134 lb 14.7 oz (61.2 kg)  02/05/18 138 lb 14.2 oz (63 kg)  11/30/17 145 lb (65.8 kg)      Physical Exam  Constitutional: He is oriented to person, place, and time. He appears well-developed and well-nourished. No distress.  HENT:  Head: Normocephalic and atraumatic.  Mouth/Throat: Oropharynx is clear and moist.  Eyes: Pupils are equal, round, and reactive to light. Conjunctivae and EOM are normal.  Neck: Normal range of motion. Neck supple.  Cardiovascular: Normal rate, regular rhythm and normal heart sounds.  No murmur heard. Pulmonary/Chest: Effort normal and breath sounds normal. No respiratory distress. He has no wheezes. He has no rales.  Abdominal: Soft. There is no tenderness.  Musculoskeletal: Normal range of motion.  Neurological: He is alert and oriented to person, place, and time. He has normal reflexes.  Skin: Skin is warm and dry.  Psychiatric: He has a normal mood and affect. His behavior is normal. Judgment and thought content normal.   Results for orders placed or performed in visit on 03/09/18  Glucose Hemocue Waived  Result Value Ref Range   Glu Hemocue Waived >444 (HH) 65 - 99 mg/dL     Assessment & Plan:   Jasman was seen today for diabetes.  Diagnoses and all orders for this visit:  Type 2 diabetes mellitus with hyperglycemia, with long-term current use of insulin (HCC) -     Glucose Hemocue Waived -     CMP14+EGFR  Need for immunization against influenza -     Flu Vaccine QUAD 36+ mos IM      I am having [name] maintain his levothyroxine, atorvastatin, carvedilol, Insulin Pen Needle, blood glucose meter kit and supplies, folic acid, thiamine, insulin aspart, and insulin aspart protamine - aspart.  No orders of the defined types were placed in this encounter.    Follow-up: Return in about 6 days (around 03/15/2018).  [name], M.D.

## 2018-03-09 NOTE — Telephone Encounter (Signed)
What is the name of the medication? Test strips for ACCU Chek  Have you contacted your pharmacy to request a refill? YES  Which pharmacy would you like this sent to? [place]   Patient notified that their request is being sent to the clinical staff for review and that they should receive a call once it is complete. If they do not receive a call within 24 hours they can check with their pharmacy or our office.

## 2018-03-12 ENCOUNTER — Ambulatory Visit: Payer: Medicaid Other

## 2018-03-12 ENCOUNTER — Other Ambulatory Visit: Payer: Self-pay

## 2018-03-12 MED ORDER — GLUCOSE BLOOD VI STRP: ORAL_STRIP | 3 refills | Status: DC

## 2018-03-14 ENCOUNTER — Telehealth: Payer: Self-pay | Admitting: Family Medicine

## 2018-03-15 ENCOUNTER — Encounter: Payer: Self-pay | Admitting: Family Medicine

## 2018-03-15 ENCOUNTER — Ambulatory Visit: Payer: Medicaid Other | Admitting: Family Medicine

## 2018-03-15 ENCOUNTER — Telehealth: Payer: Self-pay | Admitting: Family Medicine

## 2018-03-15 VITALS — BP 108/67 | HR 83 | Temp 98.0°F

## 2018-03-15 DIAGNOSIS — E1165 Type 2 diabetes mellitus with hyperglycemia: Secondary | ICD-10-CM

## 2018-03-15 LAB — GLUCOSE HEMOCUE WAIVED: Glu Hemocue Waived: 183 mg/dL — ABNORMAL HIGH (ref 65–99)

## 2018-03-15 NOTE — Telephone Encounter (Signed)
LMOVM I reached out to [facility] they contacted [facility] they have down that he has done his part, this is noted and in there system, now it is just a matter of when [facility]s system updates as to when the strips will go through and get covered.

## 2018-03-15 NOTE — Telephone Encounter (Signed)
[email or web address]

## 2018-03-20 ENCOUNTER — Encounter: Payer: Self-pay | Admitting: Family Medicine

## 2018-03-20 NOTE — Progress Notes (Signed)
Subjective:  Patient ID: Todd [name], male    DOB: 20-May-1956  Age: 62 y.o. MRN: [id number]  CC: Follow-up   HPI Dontrae Morini presents forFollow-up of diabetes. Patient checks blood sugar at home.  States it has been running in the 100-200 range.  Currently using sliding scale from 0 to 15 units for sugars ranging from 70-400.  For detailed sliding scale see his NovoLog prescription signature. No significant hypoglycemic spells noted. Medications reviewed. Pt reports taking them regularly without complication/adverse reaction being reported today.   History Syd has a past medical history of AKI (acute kidney injury) ([place]), Constipated, Diabetes mellitus without complication ([place]), Diarrhea, Elevated LFTs, Goiter, Gout, Hyperlipidemia, Hypertension, Leukocytosis, Reactive thrombocytosis, Right BKA infection ([place]) (08/2016), Right leg pain, Sepsis due to undetermined organism [facility]), Thyroid disease, and Wound infection after surgery (08/2016).   He has a past surgical history that includes Thyroid surgery; Lower Extremity Angiography (Bilateral, 08/11/2016); ABDOMINAL AORTOGRAM (N/A, 08/11/2016); PERIPHERAL VASCULAR BALLOON ANGIOPLASTY (Left, 08/11/2016); ABDOMINAL AORTOGRAM W/LOWER EXTREMITY (N/A, 08/15/2016); Amputation (Right, 08/17/2016); Amputation (Right, 09/12/2016); Application if wound vac (Right, 09/12/2016); Amputation (Left, 08/12/2016); and Amputation (Left, 11/01/2017).   His family history includes Diabetes in his maternal aunt and maternal uncle; Heart disease in his mother; Pneumonia in his father.He reports that he has been smoking cigarettes. He has a 33.75 pack-year smoking history. He has never used smokeless tobacco. He reports that he drinks alcohol. He reports that he does not use drugs.  Current Outpatient Medications on File Prior to Visit  Medication Sig Dispense Refill  . atorvastatin (LIPITOR) 40 MG tablet Take 1 tablet (40 mg total) by mouth daily at 6 PM. 30 tablet 0    . blood glucose meter kit and supplies Dispense based on patient and insurance preference. Use up to four times daily as directed. (FOR ICD-10 E10.9, E11.9). 1 each 0  . carvedilol (COREG) 3.125 MG tablet Take 1 tablet (3.125 mg total) by mouth 2 (two) times daily with a meal. 60 tablet 0  . folic acid (FOLVITE) 1 MG tablet Take 1 tablet (1 mg total) by mouth daily.    [name] glucose blood (ACCU-CHEK AVIVA PLUS) test strip Check blood sugars four times a day 400 each 3  . insulin aspart (NOVOLOG) 100 UNIT/ML FlexPen Inject 0-15 Units into the skin 3 (three) times daily with meals. CBG 70 - 120: 0 units CBG 121 - 150: 2 units CBG 151 - 200: 3 units CBG 201 - 250: 5 units CBG 251 - 300: 8 units CBG 301 - 350: 11 units CBG 351 - 400: 15 units 15 mL 2  . insulin aspart protamine - aspart (NOVOLOG MIX 70/30 FLEXPEN) (70-30) 100 UNIT/ML FlexPen Inject 0.4 mLs (40 Units total) into the skin 2 (two) times daily with a meal. 15 mL 2  . Insulin Pen Needle 31G X 5 MM MISC 1 Device by Does not apply route as directed. 100 each 1  . levothyroxine (SYNTHROID, LEVOTHROID) 75 MCG tablet Take 1 tablet (75 mcg total) by mouth daily before breakfast. 90 tablet 1  . thiamine 100 MG tablet Take 1 tablet (100 mg total) by mouth daily.     No current facility-administered medications on file prior to visit.     ROS Review of Systems  Constitutional: Negative for fever.  Respiratory: Negative for shortness of breath.   Cardiovascular: Negative for chest pain.  Musculoskeletal: Negative for arthralgias.  Skin: Negative for rash.    Objective:  BP 108/67  Pulse 83   Temp 98 F (36.7 C)   BP Readings from Last 3 Encounters:  03/15/18 108/67  03/09/18 (!) 157/85  03/02/18 (!) 162/70    Wt Readings from Last 3 Encounters:  02/15/18 134 lb 14.7 oz (61.2 kg)  02/05/18 138 lb 14.2 oz (63 kg)  11/30/17 145 lb (65.8 kg)     Physical Exam  Constitutional: He is oriented to person, place, and time. He  appears well-developed. No distress.  HENT:  Head: Normocephalic.  Eyes: Pupils are equal, round, and reactive to light.  Neck: Normal range of motion. Neck supple. No thyromegaly present.  Cardiovascular: Normal rate, regular rhythm and normal heart sounds.  Pulmonary/Chest: Effort normal and breath sounds normal.  Abdominal: Soft. There is no tenderness.  Neurological: He is alert and oriented to person, place, and time.      Assessment & Plan:   Mikie was seen today for follow-up.  Diagnoses and all orders for this visit:  Uncontrolled type 2 diabetes mellitus with hyperglycemia ([place]) -     Glucose Hemocue Waived      I am having Heaven Wandell maintain his levothyroxine, atorvastatin, carvedilol, Insulin Pen Needle, blood glucose meter kit and supplies, folic acid, thiamine, insulin aspart, insulin aspart protamine - aspart, and glucose blood.  Patient's blood glucose readings are much better today.  After discussion I think he is getting more used to his regimen and hopefully complying better with his treatment plan.  Follow-up: Return in about 8 days (around 03/23/2018).  [name], M.D.

## 2018-03-22 ENCOUNTER — Ambulatory Visit: Payer: Medicaid Other | Admitting: Cardiovascular Disease

## 2018-04-03 ENCOUNTER — Ambulatory Visit (INDEPENDENT_AMBULATORY_CARE_PROVIDER_SITE_OTHER): Payer: Medicaid Other | Admitting: Family Medicine

## 2018-04-03 ENCOUNTER — Encounter: Payer: Self-pay | Admitting: Family Medicine

## 2018-04-03 VITALS — BP 173/91 | HR 91 | Temp 98.0°F

## 2018-04-03 DIAGNOSIS — E1165 Type 2 diabetes mellitus with hyperglycemia: Secondary | ICD-10-CM | POA: Diagnosis not present

## 2018-04-03 LAB — GLUCOSE HEMOCUE WAIVED: Glu Hemocue Waived: 62 mg/dL — ABNORMAL LOW (ref 65–99)

## 2018-04-03 MED ORDER — INSULIN GLARGINE 100 UNIT/ML SOLOSTAR PEN: 75.0000 [IU] | PEN_INJECTOR | SUBCUTANEOUS | 99 refills | Status: DC

## 2018-04-03 MED ORDER — INSULIN GLARGINE 100 UNIT/ML SOLOSTAR PEN: 75.0000 [IU] | PEN_INJECTOR | Freq: Every day | SUBCUTANEOUS | 5 refills | Status: DC

## 2018-04-03 NOTE — Patient Instructions (Signed)
[facility] Vivi Barrack [phone]

## 2018-04-03 NOTE — Progress Notes (Signed)
Subjective:  Patient ID: Todd [name], male    DOB: [birth year]  Age: 62 y.o. MRN: [id number]  CC: Diabetes   HPI Todd [name] presents for diabetic checkup.  He says that he forgot to bring his blood sugars but it was 80s this morning.  He is gone back to using Lantus.  Patient denies any low blood sugar reactions.  His blood sugar was 62 in the office and he denies any nausea shakiness hungry sweating  Depression screen [facility] 2/9 03/15/2018 03/09/2018 03/02/2018  Decreased Interest 0 0 0  Down, Depressed, Hopeless 0 0 0  PHQ - 2 Score 0 0 0    History Todd [name] has a past medical history of AKI (acute kidney injury) ([place]), Constipated, Diabetes mellitus without complication ([place]), Diarrhea, Elevated LFTs, Goiter, Gout, Hyperlipidemia, Hypertension, Leukocytosis, Reactive thrombocytosis, Right BKA infection ([place]) (08/2016), Right leg pain, Sepsis due to undetermined organism [facility]), Thyroid disease, and Wound infection after surgery (08/2016).   He has a past surgical history that includes Thyroid surgery; Lower Extremity Angiography (Bilateral, 08/11/2016); ABDOMINAL AORTOGRAM (N/A, 08/11/2016); PERIPHERAL VASCULAR BALLOON ANGIOPLASTY (Left, 08/11/2016); ABDOMINAL AORTOGRAM W/LOWER EXTREMITY (N/A, 08/15/2016); Amputation (Right, 08/17/2016); Amputation (Right, 09/12/2016); Application if wound vac (Right, 09/12/2016); Amputation (Left, 08/12/2016); and Amputation (Left, 11/01/2017).   His family history includes Diabetes in his maternal aunt and maternal uncle; Heart disease in his mother; Pneumonia in his father.He reports that he has been smoking cigarettes. He has a 33.75 pack-year smoking history. He has never used smokeless tobacco. He reports that he drinks alcohol. He reports that he does not use drugs.    ROS Review of Systems  Constitutional: Negative for fever.  Respiratory: Negative for shortness of breath.   Cardiovascular: Negative for chest pain.  Musculoskeletal: Negative for  arthralgias.  Skin: Negative for rash.    Objective:  BP (!) 173/91   Pulse 91   Temp 98 F (36.7 C) (Oral)   BP Readings from Last 3 Encounters:  04/03/18 (!) 173/91  03/15/18 108/67  03/09/18 (!) 157/85    Wt Readings from Last 3 Encounters:  02/15/18 134 lb 14.7 oz (61.2 kg)  02/05/18 138 lb 14.2 oz (63 kg)  11/30/17 145 lb (65.8 kg)     Physical Exam  Constitutional: He is oriented to person, place, and time. He appears well-developed and well-nourished.  HENT:  Head: Normocephalic and atraumatic.  Right Ear: External ear normal.  Left Ear: External ear normal.  Mouth/Throat: No oropharyngeal exudate or posterior oropharyngeal erythema.  Eyes: Pupils are equal, round, and reactive to light.  Neck: Normal range of motion. Neck supple.  Cardiovascular: Normal rate and regular rhythm.  No murmur heard. Pulmonary/Chest: Breath sounds normal. No respiratory distress.  Neurological: He is alert and oriented to person, place, and time.  Vitals reviewed.     Assessment & Plan:   Todd [name] was seen today for diabetes.  Diagnoses and all orders for this visit:  Uncontrolled type 2 diabetes mellitus with hyperglycemia ([place]) -     Glucose Hemocue Waived  Other orders -     Discontinue: Insulin Glargine (LANTUS SOLOSTAR) 100 UNIT/ML Solostar Pen; Inject 75 Units into the skin every morning. -     Insulin Glargine (LANTUS SOLOSTAR) 100 UNIT/ML Solostar Pen; Inject 75 Units into the skin at bedtime.       I have discontinued Todd [name] insulin aspart and insulin aspart protamine - aspart. I have also changed his Insulin Glargine. Additionally, I am having him maintain his levothyroxine,  atorvastatin, carvedilol, Insulin Pen Needle, blood glucose meter kit and supplies, folic acid, thiamine, and glucose blood.  Allergies as of 04/03/2018      Reactions   Lisinopril Other (See Comments)   Hyperkalemia / Renal failure      Medication List        Accurate  as of 04/03/18  9:02 PM. Always use your most recent med list.          atorvastatin 40 MG tablet Commonly known as:  LIPITOR Take 1 tablet (40 mg total) by mouth daily at 6 PM.   blood glucose meter kit and supplies Dispense based on patient and insurance preference. Use up to four times daily as directed. (FOR ICD-10 E10.9, E11.9).   carvedilol 3.125 MG tablet Commonly known as:  COREG Take 1 tablet (3.125 mg total) by mouth 2 (two) times daily with a meal.   folic acid 1 MG tablet Commonly known as:  FOLVITE Take 1 tablet (1 mg total) by mouth daily.   glucose blood test strip Check blood sugars four times a day   Insulin Glargine 100 UNIT/ML Solostar Pen Commonly known as:  LANTUS Inject 75 Units into the skin at bedtime.   Insulin Pen Needle 31G X 5 MM Misc 1 Device by Does not apply route as directed.   levothyroxine 75 MCG tablet Commonly known as:  SYNTHROID, LEVOTHROID Take 1 tablet (75 mcg total) by mouth daily before breakfast.   thiamine 100 MG tablet Take 1 tablet (100 mg total) by mouth daily.        Follow-up: Return in about 1 month (around 05/04/2018) for diabetes.  [name], M.D.

## 2018-04-05 ENCOUNTER — Ambulatory Visit: Payer: Medicaid Other | Admitting: "Endocrinology

## 2018-04-19 ENCOUNTER — Encounter: Payer: Self-pay | Admitting: *Deleted

## 2018-05-04 ENCOUNTER — Ambulatory Visit: Payer: Medicaid Other | Admitting: Family Medicine

## 2018-05-08 ENCOUNTER — Telehealth: Payer: Self-pay | Admitting: Family Medicine

## 2018-05-08 ENCOUNTER — Other Ambulatory Visit: Payer: Self-pay | Admitting: Family Medicine

## 2018-05-08 NOTE — Telephone Encounter (Signed)
Patient aware rx is on file at pharmacy.

## 2018-05-08 NOTE — Telephone Encounter (Signed)
Pt aware to check with [facility] on 02/02/18 #90 with a refill was sent in

## 2018-05-08 NOTE — Telephone Encounter (Signed)
Please let him know I asked that the dose be reduced because his blood test showed the previous dose was too high. Use the 75 mcg for now.

## 2018-05-08 NOTE — Telephone Encounter (Signed)
Pharmacy states that patient told them that he was supposed to be on levothyroxine 100 mcg not the 75 mcg that is in the chart. I didn't see a change in therapy in the last office visit- please advise

## 2018-05-09 NOTE — Telephone Encounter (Signed)
Aware,  Take levothyroxine 75 mcg.

## 2018-05-14 ENCOUNTER — Encounter: Payer: Self-pay | Admitting: Family Medicine

## 2018-05-14 ENCOUNTER — Ambulatory Visit (INDEPENDENT_AMBULATORY_CARE_PROVIDER_SITE_OTHER): Payer: Self-pay | Admitting: Family Medicine

## 2018-05-14 VITALS — BP 142/80 | HR 77 | Temp 97.2°F

## 2018-05-14 DIAGNOSIS — E1165 Type 2 diabetes mellitus with hyperglycemia: Secondary | ICD-10-CM

## 2018-05-14 DIAGNOSIS — Z794 Long term (current) use of insulin: Secondary | ICD-10-CM

## 2018-05-14 MED ORDER — METOPROLOL SUCCINATE ER 50 MG PO TB24: 50.0000 mg | ORAL_TABLET | Freq: Every day | ORAL | 2 refills | Status: DC

## 2018-05-14 NOTE — Progress Notes (Signed)
Subjective:  Patient ID: Todd [name], male    DOB: 1956/03/17  Age: 62 y.o. MRN: [id number]  CC: Diabetes (1 mo follow up )   HPI Todd [name] presents forFollow-up of diabetes. Patient checks blood sugar four times a day at home.   150- 160 fasting and  postprandial Patient denies symptoms such as polyuria, polydipsia, excessive hunger, nausea No significant hypoglycemic spells noted. Medications reviewed. Pt reports taking them regularly without complication/adverse reaction being reported today.    History Todd [name] has a past medical history of AKI (acute kidney injury) ([place]), Constipated, Diabetes mellitus without complication ([place]), Diarrhea, Elevated LFTs, Goiter, Gout, Hyperlipidemia, Hypertension, Leukocytosis, Reactive thrombocytosis, Right BKA infection ([place]) (08/2016), Right leg pain, Sepsis due to undetermined organism [facility]), Thyroid disease, and Wound infection after surgery (08/2016).   Todd [name] has a past surgical history that includes Thyroid surgery; Lower Extremity Angiography (Bilateral, 08/11/2016); ABDOMINAL AORTOGRAM (N/A, 08/11/2016); PERIPHERAL VASCULAR BALLOON ANGIOPLASTY (Left, 08/11/2016); ABDOMINAL AORTOGRAM W/LOWER EXTREMITY (N/A, 08/15/2016); Amputation (Right, 08/17/2016); Amputation (Right, 09/12/2016); Application if wound vac (Right, 09/12/2016); Amputation (Left, 08/12/2016); and Amputation (Left, 11/01/2017).   His family history includes Diabetes in his maternal aunt and maternal uncle; Heart disease in his mother; Pneumonia in his father.Todd [name] reports that Todd [name] has been smoking cigarettes. Todd [name] has a 33.75 pack-year smoking history. Todd [name] has never used smokeless tobacco. Todd [name] reports that Todd [name] drinks alcohol. Todd [name] reports that Todd [name] does not use drugs.  Current Outpatient Medications on File Prior to Visit  Medication Sig Dispense Refill  . blood glucose meter kit and supplies Dispense based on patient and insurance preference. Use up to four times daily as directed. (FOR ICD-10 E10.9,  E11.9). 1 each 0  . carvedilol (COREG) 3.125 MG tablet Take 1 tablet (3.125 mg total) by mouth 2 (two) times daily with a meal. 60 tablet 0  . folic acid (FOLVITE) 1 MG tablet Take 1 tablet (1 mg total) by mouth daily.    [name] glucose blood (ACCU-CHEK AVIVA PLUS) test strip Check blood sugars four times a day 400 each 3  . Insulin Glargine (LANTUS SOLOSTAR) 100 UNIT/ML Solostar Pen Inject 75 Units into the skin at bedtime. 30 mL 5  . Insulin Pen Needle 31G X 5 MM MISC 1 Device by Does not apply route as directed. 100 each 1  . levothyroxine (SYNTHROID, LEVOTHROID) 75 MCG tablet Take 1 tablet (75 mcg total) by mouth daily before breakfast. 90 tablet 1  . thiamine 100 MG tablet Take 1 tablet (100 mg total) by mouth daily.    [name] atorvastatin (LIPITOR) 40 MG tablet Take 1 tablet (40 mg total) by mouth daily at 6 PM. 30 tablet 0   No current facility-administered medications on file prior to visit.     ROS Review of Systems  Constitutional: Negative for fever.  Respiratory: Negative for shortness of breath.   Cardiovascular: Negative for chest pain.  Musculoskeletal: Positive for gait problem (Bilateral AKA, wheelchair-bound) and myalgias.  Skin: Negative for rash.    Objective:  BP (!) 142/80   Pulse 77   Temp (!) 97.2 F (36.2 C) (Oral)   BP Readings from Last 3 Encounters:  05/14/18 (!) 142/80  04/03/18 (!) 173/91  03/15/18 108/67    Wt Readings from Last 3 Encounters:  02/15/18 134 lb 14.7 oz (61.2 kg)  02/05/18 138 lb 14.2 oz (63 kg)  11/30/17 145 lb (65.8 kg)     Physical Exam  Constitutional: Todd [name] is oriented to person, place, and time. Todd [name] appears  well-developed and well-nourished.  HENT:  Head: Normocephalic and atraumatic.  Mouth/Throat: No oropharyngeal exudate or posterior oropharyngeal erythema.  Eyes: Pupils are equal, round, and reactive to light.  Neck: Normal range of motion. Neck supple.  Cardiovascular: Normal rate and regular rhythm.  No murmur  heard. Pulmonary/Chest: Breath sounds normal. No respiratory distress.  Neurological: Todd [name] is alert and oriented to person, place, and time.  Vitals reviewed.     Assessment & Plan:   Todd [name] was seen today for diabetes.  Diagnoses and all orders for this visit:  Type 2 diabetes mellitus with hyperglycemia, with long-term current use of insulin ([place])  Other orders -     metoprolol succinate (TOPROL-XL) 50 MG 24 hr tablet; Take 1 tablet (50 mg total) by mouth daily. For blood pressure control and kidney protection      I am having Todd [name] start on metoprolol succinate. I am also having him maintain his levothyroxine, atorvastatin, carvedilol, Insulin Pen Needle, blood glucose meter kit and supplies, folic acid, thiamine, glucose blood, and Insulin Glargine.  Meds ordered this encounter  Medications  . metoprolol succinate (TOPROL-XL) 50 MG 24 hr tablet    Sig: Take 1 tablet (50 mg total) by mouth daily. For blood pressure control and kidney protection    Dispense:  30 tablet    Refill:  2     Follow-up: Return in about 2 months (around 07/15/2018).  [name], M.D.

## 2018-06-12 ENCOUNTER — Ambulatory Visit: Payer: Medicaid Other | Admitting: Cardiovascular Disease

## 2018-07-17 ENCOUNTER — Ambulatory Visit (INDEPENDENT_AMBULATORY_CARE_PROVIDER_SITE_OTHER): Payer: Self-pay | Admitting: Family Medicine

## 2018-07-17 ENCOUNTER — Encounter: Payer: Self-pay | Admitting: Family Medicine

## 2018-07-17 VITALS — BP 133/81 | HR 71 | Temp 98.7°F

## 2018-07-17 DIAGNOSIS — E1165 Type 2 diabetes mellitus with hyperglycemia: Secondary | ICD-10-CM

## 2018-07-17 DIAGNOSIS — E1122 Type 2 diabetes mellitus with diabetic chronic kidney disease: Secondary | ICD-10-CM

## 2018-07-17 DIAGNOSIS — Z720 Tobacco use: Secondary | ICD-10-CM

## 2018-07-17 DIAGNOSIS — E782 Mixed hyperlipidemia: Secondary | ICD-10-CM

## 2018-07-17 DIAGNOSIS — E785 Hyperlipidemia, unspecified: Secondary | ICD-10-CM | POA: Insufficient documentation

## 2018-07-17 DIAGNOSIS — I10 Essential (primary) hypertension: Secondary | ICD-10-CM

## 2018-07-17 DIAGNOSIS — N183 Chronic kidney disease, stage 3 unspecified: Secondary | ICD-10-CM

## 2018-07-17 DIAGNOSIS — Z89612 Acquired absence of left leg above knee: Secondary | ICD-10-CM

## 2018-07-17 DIAGNOSIS — E039 Hypothyroidism, unspecified: Secondary | ICD-10-CM

## 2018-07-17 DIAGNOSIS — Z794 Long term (current) use of insulin: Secondary | ICD-10-CM

## 2018-07-17 DIAGNOSIS — Z89611 Acquired absence of right leg above knee: Secondary | ICD-10-CM

## 2018-07-17 MED ORDER — ATORVASTATIN CALCIUM 40 MG PO TABS: 40.0000 mg | ORAL_TABLET | Freq: Every day | ORAL | 1 refills | Status: DC

## 2018-07-17 MED ORDER — METOPROLOL SUCCINATE ER 50 MG PO TB24: 50.0000 mg | ORAL_TABLET | Freq: Every day | ORAL | 2 refills | Status: DC

## 2018-07-17 MED ORDER — LEVOTHYROXINE SODIUM 75 MCG PO TABS: 75.0000 ug | ORAL_TABLET | Freq: Every day | ORAL | 1 refills | Status: DC

## 2018-07-17 MED ORDER — INSULIN GLARGINE 100 UNIT/ML SOLOSTAR PEN: 75.0000 [IU] | PEN_INJECTOR | Freq: Every day | SUBCUTANEOUS | 5 refills | Status: DC

## 2018-07-17 NOTE — Progress Notes (Signed)
Subjective:  Patient ID: Todd [name],  male    DOB: [birth year]  Age: 63 y.o.    CC: Medical Management of Chronic Issues   HPI Cheskel Silverio presents for  follow-up of hypertension. Patient has no history of headache chest pain or shortness of breath or recent cough. Patient also denies symptoms of TIA such as numbness weakness lateralizing. Patient denies side effects from medication. States taking it regularly.  Patient also  in for follow-up of elevated cholesterol. Doing well without complaints on current medication. Denies side effects  including myalgia and arthralgia and nausea. Also in today for liver function testing. Currently no chest pain, shortness of breath or other cardiovascular related symptoms noted.  Follow-up of diabetes. Patient does check blood sugar at home. Readings run between 110 and 130. A little higher in the evening Patient denies symptoms such as excessive hunger or urinary frequency, excessive hunger, nausea No significant hypoglycemic spells noted. Medications reviewed. Pt reports taking them regularly. Pt. denies complication/adverse reaction today.    History Wael has a past medical history of AKI (acute kidney injury) ([place]), Constipated, Diabetes mellitus without complication ([place]), Diarrhea, Elevated LFTs, Goiter, Gout, Hyperlipidemia, Hypertension, Leukocytosis, Reactive thrombocytosis, Right BKA infection ([place]) (08/2016), Right leg pain, Sepsis due to undetermined organism [facility]), Thyroid disease, and Wound infection after surgery (08/2016).   He has a past surgical history that includes Thyroid surgery; Lower Extremity Angiography (Bilateral, 08/11/2016); ABDOMINAL AORTOGRAM (N/A, 08/11/2016); PERIPHERAL VASCULAR BALLOON ANGIOPLASTY (Left, 08/11/2016); ABDOMINAL AORTOGRAM W/LOWER EXTREMITY (N/A, 08/15/2016); Amputation (Right, 08/17/2016); Amputation (Right, 09/12/2016); Application if wound vac (Right, 09/12/2016); Amputation (Left, 08/12/2016); and  Amputation (Left, 11/01/2017).   His family history includes Diabetes in his maternal aunt and maternal uncle; Heart disease in his mother; Pneumonia in his father.He reports that he has been smoking cigarettes. He has a 33.75 pack-year smoking history. He has never used smokeless tobacco. He reports current alcohol use. He reports that he does not use drugs.  Current Outpatient Medications on File Prior to Visit  Medication Sig Dispense Refill  . blood glucose meter kit and supplies Dispense based on patient and insurance preference. Use up to four times daily as directed. (FOR ICD-10 E10.9, E11.9). 1 each 0  . folic acid (FOLVITE) 1 MG tablet Take 1 tablet (1 mg total) by mouth daily.    [name] glucose blood (ACCU-CHEK AVIVA PLUS) test strip Check blood sugars four times a day 400 each 3  . Insulin Pen Needle 31G X 5 MM MISC 1 Device by Does not apply route as directed. 100 each 1  . thiamine 100 MG tablet Take 1 tablet (100 mg total) by mouth daily.    . carvedilol (COREG) 3.125 MG tablet Take 1 tablet (3.125 mg total) by mouth 2 (two) times daily with a meal. 60 tablet 0   No current facility-administered medications on file prior to visit.     ROS Review of Systems  Constitutional: Negative.   HENT: Negative.   Eyes: Negative for visual disturbance.  Respiratory: Negative for cough and shortness of breath.   Cardiovascular: Negative for chest pain and leg swelling.  Gastrointestinal: Negative for abdominal pain, diarrhea, nausea and vomiting.  Genitourinary: Negative for difficulty urinating.  Musculoskeletal: Positive for gait problem (Bilat AKA). Negative for arthralgias and myalgias.  Skin: Negative for rash.  Neurological: Negative for headaches.  Psychiatric/Behavioral: Negative for sleep disturbance.    Objective:  BP 133/81   Pulse 71   Temp 98.7 F (37.1 C) (Oral)  BP Readings from Last 3 Encounters:  07/17/18 133/81  05/14/18 (!) 142/80  04/03/18 (!) 173/91    Wt  Readings from Last 3 Encounters:  02/15/18 134 lb 14.7 oz (61.2 kg)  02/05/18 138 lb 14.2 oz (63 kg)  11/30/17 145 lb (65.8 kg)     Physical Exam Constitutional:      General: He is not in acute distress.    Appearance: He is well-developed.  HENT:     Head: Normocephalic and atraumatic.     Right Ear: External ear normal.     Left Ear: External ear normal.     Nose: Nose normal.  Eyes:     Conjunctiva/sclera: Conjunctivae normal.     Pupils: Pupils are equal, round, and reactive to light.  Neck:     Musculoskeletal: Normal range of motion and neck supple.  Cardiovascular:     Rate and Rhythm: Normal rate and regular rhythm.     Heart sounds: Normal heart sounds. No murmur.  Pulmonary:     Effort: Pulmonary effort is normal. No respiratory distress.     Breath sounds: Normal breath sounds. No wheezing or rales.  Abdominal:     Palpations: Abdomen is soft.     Tenderness: There is no abdominal tenderness.  Musculoskeletal: Normal range of motion.        General: Deformity (bilat AKA) present.  Skin:    General: Skin is warm and dry.  Neurological:     Mental Status: He is alert and oriented to person, place, and time.     Deep Tendon Reflexes: Reflexes are normal and symmetric.  Psychiatric:        Behavior: Behavior normal.        Thought Content: Thought content normal.        Judgment: Judgment normal.         Assessment & Plan:   Ephrem was seen today for medical management of chronic issues.  Diagnoses and all orders for this visit:  Type 2 diabetes mellitus with hyperglycemia, with long-term current use of insulin (HCC) -     CBC with Differential/Platelet -     CMP14+EGFR -     Lipid panel -     Bayer DCA Hb A1c Waived  Essential hypertension  Uncontrolled type 2 diabetes mellitus with hyperglycemia ([place])  CKD stage 3 due to type 2 diabetes mellitus (HCC)  Hypothyroidism, unspecified type  Mixed hyperlipidemia -     Lipid panel  S/P AKA  (above knee amputation) bilateral (HCC)  Other orders -     metoprolol succinate (TOPROL-XL) 50 MG 24 hr tablet; Take 1 tablet (50 mg total) by mouth daily. For blood pressure control and kidney protection -     levothyroxine (SYNTHROID, LEVOTHROID) 75 MCG tablet; Take 1 tablet (75 mcg total) by mouth daily before breakfast. -     Insulin Glargine (LANTUS SOLOSTAR) 100 UNIT/ML Solostar Pen; Inject 75 Units into the skin at bedtime. -     atorvastatin (LIPITOR) 40 MG tablet; Take 1 tablet (40 mg total) by mouth daily at 6 PM for 30 days.   I have changed Javion Cornforth's atorvastatin. I am also having him maintain his carvedilol, Insulin Pen Needle, blood glucose meter kit and supplies, folic acid, thiamine, glucose blood, metoprolol succinate, levothyroxine, and Insulin Glargine.  Meds ordered this encounter  Medications  . metoprolol succinate (TOPROL-XL) 50 MG 24 hr tablet    Sig: Take 1 tablet (50 mg total) by mouth daily. For  blood pressure control and kidney protection    Dispense:  30 tablet    Refill:  2  . levothyroxine (SYNTHROID, LEVOTHROID) 75 MCG tablet    Sig: Take 1 tablet (75 mcg total) by mouth daily before breakfast.    Dispense:  90 tablet    Refill:  1    Please consider 90 day supplies to promote better adherence  . Insulin Glargine (LANTUS SOLOSTAR) 100 UNIT/ML Solostar Pen    Sig: Inject 75 Units into the skin at bedtime.    Dispense:  30 mL    Refill:  5  . atorvastatin (LIPITOR) 40 MG tablet    Sig: Take 1 tablet (40 mg total) by mouth daily at 6 PM for 30 days.    Dispense:  90 tablet    Refill:  1     Follow-up: No follow-ups on file.  [name], M.D.

## 2018-07-18 ENCOUNTER — Other Ambulatory Visit: Payer: Self-pay

## 2018-07-18 DIAGNOSIS — Z794 Long term (current) use of insulin: Secondary | ICD-10-CM

## 2018-07-18 DIAGNOSIS — E1165 Type 2 diabetes mellitus with hyperglycemia: Secondary | ICD-10-CM

## 2018-07-18 LAB — LIPID PANEL
CHOL/HDL RATIO: 6.9 ratio — AB (ref 0.0–5.0)
Cholesterol, Total: 249 mg/dL — ABNORMAL HIGH (ref 100–199)
HDL: 36 mg/dL — ABNORMAL LOW (ref >39)
TRIGLYCERIDES: 556 mg/dL — AB (ref 0–149)

## 2018-07-18 LAB — CMP14+EGFR
ALBUMIN: 4.3 g/dL (ref 3.8–4.8)
ALT: 44 IU/L (ref 0–44)
AST: 67 IU/L — ABNORMAL HIGH (ref 0–40)
Albumin/Globulin Ratio: 0.9 — ABNORMAL LOW (ref 1.2–2.2)
Alkaline Phosphatase: 141 IU/L — ABNORMAL HIGH (ref 39–117)
BUN / CREAT RATIO: 9 — AB (ref 10–24)
BUN: 15 mg/dL (ref 8–27)
Bilirubin Total: 0.6 mg/dL (ref 0.0–1.2)
CALCIUM: 9.2 mg/dL (ref 8.6–10.2)
CO2: 17 mmol/L — AB (ref 20–29)
CREATININE: 1.7 mg/dL — AB (ref 0.76–1.27)
Chloride: 97 mmol/L (ref 96–106)
GFR, EST AFRICAN AMERICAN: 49 mL/min/{1.73_m2} — AB (ref >59)
GFR, EST NON AFRICAN AMERICAN: 42 mL/min/{1.73_m2} — AB (ref >59)
GLUCOSE: 432 mg/dL — AB (ref 65–99)
Globulin, Total: 4.8 g/dL — ABNORMAL HIGH (ref 1.5–4.5)
POTASSIUM: 4.8 mmol/L (ref 3.5–5.2)
SODIUM: 136 mmol/L (ref 134–144)
TOTAL PROTEIN: 9.1 g/dL — AB (ref 6.0–8.5)

## 2018-07-18 LAB — CBC WITH DIFFERENTIAL/PLATELET
BASOS ABS: 0.1 10*3/uL (ref 0.0–0.2)
Basos: 2 %
EOS (ABSOLUTE): 0.4 10*3/uL (ref 0.0–0.4)
Eos: 7 %
HEMOGLOBIN: 15.6 g/dL (ref 13.0–17.7)
Hematocrit: 44.5 % (ref 37.5–51.0)
IMMATURE GRANS (ABS): 0 10*3/uL (ref 0.0–0.1)
IMMATURE GRANULOCYTES: 0 %
LYMPHS: 25 %
Lymphocytes Absolute: 1.3 10*3/uL (ref 0.7–3.1)
MCH: 30.1 pg (ref 26.6–33.0)
MCHC: 35.1 g/dL (ref 31.5–35.7)
MCV: 86 fL (ref 79–97)
Monocytes Absolute: 0.4 10*3/uL (ref 0.1–0.9)
Monocytes: 8 %
Neutrophils Absolute: 3.1 10*3/uL (ref 1.4–7.0)
Neutrophils: 58 %
Platelets: 391 10*3/uL (ref 150–450)
RBC: 5.18 x10E6/uL (ref 4.14–5.80)
RDW: 12.9 % (ref 11.6–15.4)
WBC: 5.3 10*3/uL (ref 3.4–10.8)

## 2018-07-18 LAB — BAYER DCA HB A1C WAIVED: HB A1C (BAYER DCA - WAIVED): 12.3 % — ABNORMAL HIGH (ref <7.0)

## 2018-07-27 ENCOUNTER — Encounter: Payer: Self-pay | Admitting: Cardiovascular Disease

## 2018-07-27 ENCOUNTER — Ambulatory Visit (INDEPENDENT_AMBULATORY_CARE_PROVIDER_SITE_OTHER): Payer: Self-pay | Admitting: Cardiovascular Disease

## 2018-07-27 ENCOUNTER — Telehealth: Payer: Self-pay | Admitting: Cardiovascular Disease

## 2018-07-27 VITALS — BP 150/92 | HR 67

## 2018-07-27 DIAGNOSIS — N183 Chronic kidney disease, stage 3 unspecified: Secondary | ICD-10-CM

## 2018-07-27 DIAGNOSIS — I25708 Atherosclerosis of coronary artery bypass graft(s), unspecified, with other forms of angina pectoris: Secondary | ICD-10-CM

## 2018-07-27 DIAGNOSIS — E782 Mixed hyperlipidemia: Secondary | ICD-10-CM

## 2018-07-27 DIAGNOSIS — R0989 Other specified symptoms and signs involving the circulatory and respiratory systems: Secondary | ICD-10-CM

## 2018-07-27 DIAGNOSIS — I10 Essential (primary) hypertension: Secondary | ICD-10-CM

## 2018-07-27 DIAGNOSIS — I429 Cardiomyopathy, unspecified: Secondary | ICD-10-CM

## 2018-07-27 DIAGNOSIS — I739 Peripheral vascular disease, unspecified: Secondary | ICD-10-CM

## 2018-07-27 NOTE — Telephone Encounter (Signed)
Please give pt's sister [name] a call @ [phone], she's on pt's DPR and would like to know how his apt went.

## 2018-07-27 NOTE — Patient Instructions (Signed)
Medication Instructions: Take aspirin 81 mg daily  Labwork: None today  Procedures/Testing: Your physician has requested that you have a carotid duplex. This test is an ultrasound of the carotid arteries in your neck. It looks at blood flow through these arteries that supply the brain with blood. Allow one hour for this exam. There are no restrictions or special instructions.   Follow-Up: 1 year with Dr.Koneswaran  Any Additional Special Instructions Will Be Listed Below (If Applicable).     If you need a refill on your cardiac medications before your next appointment, please call your pharmacy.

## 2018-07-27 NOTE — Progress Notes (Signed)
SUBJECTIVE: The patient presents for past due follow-up.  I last saw him in October 2018.  Past medical history includes type 2 diabetes, hypertension, chronic kidney disease stage III, mixed hyperlipidemia, and peripheral arterial disease.  He underwent right BKA and left AKA in 2018.  He has presumed coronary artery disease with cardiomyopathy, LVEF 45-50% by echocardiogram in March 2018.  However, follow-up echocardiogram on 02/13/2018 demonstrated normalization of left ventricular systolic function, LVEF 55 to 60%, mild LVH, and grade 1 diastolic dysfunction.  The patient denies any symptoms of chest pain, palpitations, shortness of breath, lightheadedness, dizziness, leg swelling, orthopnea, PND, and syncope.  He is not on aspirin.  It appears he is taking Toprol-XL 50 mg daily as confirmed by my nurse with his pharmacy.  Troponins nonspecifically elevated at 0.043 in September 2019.  I personally reviewed the ECG performed at that time which demonstrated sinus rhythm with PACs, sinus arrhythmia, and diffuse nonspecific ST segment and T wave abnormalities.   Review of Systems: As per "subjective", otherwise negative.  Allergies  Allergen Reactions  . Lisinopril Other (See Comments)    Hyperkalemia / Renal failure    Current Outpatient Medications  Medication Sig Dispense Refill  . atorvastatin (LIPITOR) 40 MG tablet Take 1 tablet (40 mg total) by mouth daily at 6 PM for 30 days. 90 tablet 1  . blood glucose meter kit and supplies Dispense based on patient and insurance preference. Use up to four times daily as directed. (FOR ICD-10 E10.9, E11.9). 1 each 0  . carvedilol (COREG) 3.125 MG tablet Take 1 tablet (3.125 mg total) by mouth 2 (two) times daily with a meal. 60 tablet 0  . folic acid (FOLVITE) 1 MG tablet Take 1 tablet (1 mg total) by mouth daily.    [name] glucose blood (ACCU-CHEK AVIVA PLUS) test strip Check blood sugars four times a day 400 each 3  . Insulin Glargine  (LANTUS SOLOSTAR) 100 UNIT/ML Solostar Pen Inject 75 Units into the skin at bedtime. 30 mL 5  . Insulin Pen Needle 31G X 5 MM MISC 1 Device by Does not apply route as directed. 100 each 1  . levothyroxine (SYNTHROID, LEVOTHROID) 75 MCG tablet Take 1 tablet (75 mcg total) by mouth daily before breakfast. 90 tablet 1  . metoprolol succinate (TOPROL-XL) 50 MG 24 hr tablet Take 1 tablet (50 mg total) by mouth daily. For blood pressure control and kidney protection 30 tablet 2  . thiamine 100 MG tablet Take 1 tablet (100 mg total) by mouth daily.     No current facility-administered medications for this visit.     Past Medical History:  Diagnosis Date  . AKI (acute kidney injury) ([place])   . Constipated   . Diabetes mellitus without complication ([place])   . Diarrhea   . Elevated LFTs   . Goiter   . Gout   . Hyperlipidemia   . Hypertension   . Leukocytosis   . Reactive thrombocytosis   . Right BKA infection ([place]) 08/2016  . Right leg pain   . Sepsis due to undetermined organism ([place])   . Thyroid disease   . Wound infection after surgery 08/2016    Past Surgical History:  Procedure Laterality Date  . ABDOMINAL AORTOGRAM N/A 08/11/2016   Procedure: Abdominal Aortogram;  Surgeon: Waynetta Sandy, MD;  Location: [place] [facility];  Service: Cardiovascular;  Laterality: N/A;  . ABDOMINAL AORTOGRAM W/LOWER EXTREMITY N/A 08/15/2016   Procedure: Abdominal Aortogram w/Lower  Extremity;  Surgeon: Elam Dutch, MD;  Location: [place] [facility];  Service: Cardiovascular;  Laterality: N/A;  . AMPUTATION Right 08/17/2016   Procedure: RIGHT BELOW KNEE AMPUTATION;  Surgeon: Elam Dutch, MD;  Location: [place];  Service: Vascular;  Laterality: Right;  . AMPUTATION Right 09/12/2016   Procedure: AMPUTATION ABOVE KNEE;  Surgeon: Newt Minion, MD;  Location: [place];  Service: Orthopedics;  Laterality: Right;  . AMPUTATION Left 08/12/2016   Procedure: LEFT BELOW KNEE AMPUTATION;  Surgeon: Newt Minion, MD;  Location: [place];  Service: Orthopedics;  Laterality: Left;  . AMPUTATION Left 11/01/2017   Procedure: LEFT ABOVE KNEE AMPUTATION;  Surgeon: Newt Minion, MD;  Location: [place];  Service: Orthopedics;  Laterality: Left;  . APPLICATION OF WOUND VAC Right 09/12/2016   Procedure: APPLICATION OF WOUND VAC ABOVE KNEE;  Surgeon: Newt Minion, MD;  Location: [place];  Service: Orthopedics;  Laterality: Right;  . LOWER EXTREMITY ANGIOGRAPHY Bilateral 08/11/2016   Procedure: Lower Extremity Angiography;  Surgeon: Waynetta Sandy, MD;  Location: [place] [facility];  Service: Cardiovascular;  Laterality: Bilateral;  . PERIPHERAL VASCULAR BALLOON ANGIOPLASTY Left 08/11/2016   Procedure: Peripheral Vascular Balloon Angioplasty;  Surgeon: Waynetta Sandy, MD;  Location: [place] [facility];  Service: Cardiovascular;  Laterality: Left;  SFA  . THYROID SURGERY      Social History   Socioeconomic History  . Marital status: Single    Spouse name: Not on file  . Number of children: Not on file  . Years of education: Not on file  . Highest education level: Not on file  Occupational History  . Occupation: drives a Visual merchandiser  . Financial resource strain: Not on file  . Food insecurity:    Worry: Not on file    Inability: Not on file  . Transportation needs:    Medical: Not on file    Non-medical: Not on file  Tobacco Use  . Smoking status: Current Every Day Smoker    Packs/day: 0.25    Years: 45.00    Pack years: 11.25    Types: Cigarettes    Last attempt to quit: 11/12/2014    Years since quitting: 3.7  . Smokeless tobacco: Never Used  Substance and Sexual Activity  . Alcohol use: Not Currently    Alcohol/week: 0.0 standard drinks  . Drug use: No  . Sexual activity: Not Currently  Lifestyle  . Physical activity:    Days per week: Not on file    Minutes per session: Not on file  . Stress: Not on file  Relationships  . Social connections:    Talks on phone: Not  on file    Gets together: Not on file    Attends religious service: Not on file    Active member of club or organization: Not on file    Attends meetings of clubs or organizations: Not on file    Relationship status: Not on file  . Intimate partner violence:    Fear of current or ex partner: Not on file    Emotionally abused: Not on file    Physically abused: Not on file    Forced sexual activity: Not on file  Other Topics Concern  . Not on file  Social History Narrative  . Not on file     Vitals:   07/27/18 0935  BP: (!) 150/92  Pulse: 67  SpO2: 99%    Wt Readings from  Last 3 Encounters:  02/15/18 134 lb 14.7 oz (61.2 kg)  02/05/18 138 lb 14.2 oz (63 kg)  11/30/17 145 lb (65.8 kg)     PHYSICAL EXAM General: NAD HEENT: Normal. Neck: No JVD, no thyromegaly. Lungs: Clear to auscultation bilaterally with normal respiratory effort. CV: Regular rate and rhythm, normal S1/S2, no S3/S4, no murmur.   Right carotid bruit. Abdomen: Soft, nontender, no distention.  Neurologic: Alert and oriented.  Psych: Normal affect. Skin: Normal. Musculoskeletal: Left AKA, right BKA.    ECG: Reviewed above under Subjective   Labs: Lab Results  Component Value Date/Time   K 4.8 07/17/2018 09:30 AM   BUN 15 07/17/2018 09:30 AM   CREATININE 1.70 (H) 07/17/2018 09:30 AM   ALT 44 07/17/2018 09:30 AM   TSH 0.418 (L) 02/02/2018 11:49 AM   HGB 15.6 07/17/2018 09:30 AM     Lipids: Lab Results  Component Value Date/Time   LDLCALC Comment 07/17/2018 09:30 AM   CHOL 249 (H) 07/17/2018 09:30 AM   TRIG 556 (HH) 07/17/2018 09:30 AM   HDL 36 (L) 07/17/2018 09:30 AM       ASSESSMENT AND PLAN: 1.  Presumed coronary disease: Symptomatically stable with normalization of left ventricular systolic function by echocardiogram in September 2019.  Troponins nonspecifically elevated at 0.043 in September 2019.  ECG with diffuse nonspecific ST segment and T wave abnormalities.  Continue Toprol-XL  50 mg daily.  He is not taking carvedilol as confirmed by my nurse with his pharmacy today.  I will start aspirin 81 mg daily.  Given his advanced chronic kidney disease, I would have a high threshold for coronary angiography.  2.  Cardiomyopathy: Echocardiogram in September 2019 demonstrated normalization of left ventricular systolic function.  He is euvolemic.  3.  Hypertension: BP is elevated.  He is on Toprol-XL 50 mg daily.  Heart rate in 60 bpm range.  He may need further antihypertensive titration.  No changes today.  4.  Mixed dyslipidemia: Lipid panel reviewed above with markedly elevated total cholesterol and triglycerides.  He is now on atorvastatin.  This is managed by PCP.  5.  Right carotid bruit: I will obtain carotid Dopplers.  6.  Chronic kidney disease stage III: Creatinine 1.7 on 07/17/2018.   Disposition: Follow up 1 year.    Kate Sable, M.D., F.A.C.C.

## 2018-07-30 NOTE — Telephone Encounter (Signed)
I spoke with sister and told her brother needs carotid US, he had told her MRI. I will call her with results after 2/28 apt

## 2018-08-08 ENCOUNTER — Other Ambulatory Visit: Payer: Self-pay | Admitting: Family Medicine

## 2018-08-08 MED ORDER — METOPROLOL SUCCINATE ER 50 MG PO TB24: 50.0000 mg | ORAL_TABLET | Freq: Every day | ORAL | 2 refills | Status: DC

## 2018-08-08 NOTE — Telephone Encounter (Signed)
What is the name of the medication? levothyroxin and metoprol  Have you contacted your pharmacy to request a refill? yes  Which pharmacy would you like this sent to? [facility]   Patient notified that their request is being sent to the clinical staff for review and that they should receive a call once it is complete. If they do not receive a call within 24 hours they can check with their pharmacy or our office.

## 2018-08-10 ENCOUNTER — Ambulatory Visit (HOSPITAL_COMMUNITY)
Admission: RE | Admit: 2018-08-10 | Discharge: 2018-08-10 | Disposition: A | Discharge status: Home / Self Care | Payer: Self-pay | Source: Ambulatory Visit | Attending: Cardiovascular Disease | Admitting: Cardiovascular Disease

## 2018-08-10 DIAGNOSIS — R0989 Other specified symptoms and signs involving the circulatory and respiratory systems: Secondary | ICD-10-CM | POA: Insufficient documentation

## 2018-08-30 ENCOUNTER — Ambulatory Visit (INDEPENDENT_AMBULATORY_CARE_PROVIDER_SITE_OTHER): Payer: Self-pay | Admitting: "Endocrinology

## 2018-08-30 ENCOUNTER — Other Ambulatory Visit: Payer: Self-pay

## 2018-08-30 ENCOUNTER — Encounter: Payer: Self-pay | Admitting: "Endocrinology

## 2018-08-30 VITALS — BP 132/71 | HR 68

## 2018-08-30 DIAGNOSIS — Z794 Long term (current) use of insulin: Secondary | ICD-10-CM

## 2018-08-30 DIAGNOSIS — E039 Hypothyroidism, unspecified: Secondary | ICD-10-CM

## 2018-08-30 DIAGNOSIS — N184 Chronic kidney disease, stage 4 (severe): Secondary | ICD-10-CM

## 2018-08-30 DIAGNOSIS — E1122 Type 2 diabetes mellitus with diabetic chronic kidney disease: Secondary | ICD-10-CM

## 2018-08-30 DIAGNOSIS — E782 Mixed hyperlipidemia: Secondary | ICD-10-CM

## 2018-08-30 DIAGNOSIS — I10 Essential (primary) hypertension: Secondary | ICD-10-CM

## 2018-08-30 MED ORDER — FENOFIBRATE 145 MG PO TABS: 145.0000 mg | ORAL_TABLET | Freq: Every day | ORAL | 3 refills | Status: DC

## 2018-08-30 MED ORDER — INSULIN GLARGINE 100 UNIT/ML SOLOSTAR PEN: 60.0000 [IU] | PEN_INJECTOR | Freq: Every day | SUBCUTANEOUS | 5 refills | Status: DC

## 2018-08-30 MED ORDER — ATORVASTATIN CALCIUM 40 MG PO TABS: 40.0000 mg | ORAL_TABLET | Freq: Every day | ORAL | 2 refills | Status: DC

## 2018-08-30 NOTE — Patient Instructions (Signed)

## 2018-08-30 NOTE — Progress Notes (Signed)
Endocrinology Consult Note       08/30/2018, 10:38 AM   Subjective:    Patient ID: Todd [name], male    DOB: 09/03/1955.  Todd [name] is being seen in consultation for management of currently uncontrolled symptomatic diabetes requested by  [name], MD.   Past Medical History:  Diagnosis Date  . AKI (acute kidney injury) ([place])   . Constipated   . Diabetes mellitus without complication ([place])   . Diarrhea   . Elevated LFTs   . Goiter   . Gout   . Hyperlipidemia   . Hypertension   . Leukocytosis   . Reactive thrombocytosis   . Right BKA infection ([place]) 08/2016  . Right leg pain   . Sepsis due to undetermined organism ([place])   . Thyroid disease   . Wound infection after surgery 08/2016    Past Surgical History:  Procedure Laterality Date  . ABDOMINAL AORTOGRAM N/A 08/11/2016   Procedure: Abdominal Aortogram;  Surgeon: Waynetta Sandy, MD;  Location: [facility];  Service: Cardiovascular;  Laterality: N/A;  . ABDOMINAL AORTOGRAM W/LOWER EXTREMITY N/A 08/15/2016   Procedure: Abdominal Aortogram w/Lower Extremity;  Surgeon: Elam Dutch, MD;  Location: [facility];  Service: Cardiovascular;  Laterality: N/A;  . AMPUTATION Right 08/17/2016   Procedure: RIGHT BELOW KNEE AMPUTATION;  Surgeon: Elam Dutch, MD;  Location: [place];  Service: Vascular;  Laterality: Right;  . AMPUTATION Right 09/12/2016   Procedure: AMPUTATION ABOVE KNEE;  Surgeon: Newt Minion, MD;  Location: [place];  Service: Orthopedics;  Laterality: Right;  . AMPUTATION Left 08/12/2016   Procedure: LEFT BELOW KNEE AMPUTATION;  Surgeon: Newt Minion, MD;  Location: [place];  Service: Orthopedics;  Laterality: Left;  . AMPUTATION Left 11/01/2017   Procedure: LEFT ABOVE KNEE AMPUTATION;  Surgeon: Newt Minion, MD;  Location: [facility];  Service: Orthopedics;  Laterality: Left;  . APPLICATION OF WOUND VAC Right  09/12/2016   Procedure: APPLICATION OF WOUND VAC ABOVE KNEE;  Surgeon: Newt Minion, MD;  Location: [place];  Service: Orthopedics;  Laterality: Right;  . LOWER EXTREMITY ANGIOGRAPHY Bilateral 08/11/2016   Procedure: Lower Extremity Angiography;  Surgeon: Waynetta Sandy, MD;  Location: [facility];  Service: Cardiovascular;  Laterality: Bilateral;  . PERIPHERAL VASCULAR BALLOON ANGIOPLASTY Left 08/11/2016   Procedure: Peripheral Vascular Balloon Angioplasty;  Surgeon: Waynetta Sandy, MD;  Location: [facility];  Service: Cardiovascular;  Laterality: Left;  SFA  . THYROID SURGERY      Social History   Socioeconomic History  . Marital status: Single    Spouse name: Not on file  . Number of children: Not on file  . Years of education: Not on file  . Highest education level: Not on file  Occupational History  . Occupation: drives a Visual merchandiser  . Financial resource strain: Not on file  . Food insecurity:    Worry: Not on file    Inability: Not on file  . Transportation needs:    Medical: Not on file    Non-medical: Not on file  Tobacco  Use  . Smoking status: Current Every Day Smoker    Packs/day: 0.25    Years: 45.00    Pack years: 11.25    Types: Cigarettes    Last attempt to quit: 11/12/2014    Years since quitting: 3.8  . Smokeless tobacco: Never Used  Substance and Sexual Activity  . Alcohol use: Not Currently    Alcohol/week: 0.0 standard drinks  . Drug use: No  . Sexual activity: Not Currently  Lifestyle  . Physical activity:    Days per week: Not on file    Minutes per session: Not on file  . Stress: Not on file  Relationships  . Social connections:    Talks on phone: Not on file    Gets together: Not on file    Attends religious service: Not on file    Active member of club or organization: Not on file    Attends meetings of clubs or organizations: Not on file    Relationship status: Not on file  Other Topics Concern  .  Not on file  Social History Narrative  . Not on file    Family History  Problem Relation Age of Onset  . Heart disease Mother   . Pneumonia Father   . Diabetes Maternal Aunt   . Diabetes Maternal Uncle     Outpatient Encounter Medications as of 08/30/2018  Medication Sig  . aspirin EC 81 MG tablet Take 81 mg by mouth daily.  [name] atorvastatin (LIPITOR) 40 MG tablet Take 1 tablet (40 mg total) by mouth at bedtime.  . blood glucose meter kit and supplies Dispense based on patient and insurance preference. Use up to four times daily as directed. (FOR ICD-10 E10.9, E11.9).  . fenofibrate (TRICOR) 145 MG tablet Take 1 tablet (145 mg total) by mouth daily.  . folic acid (FOLVITE) 1 MG tablet Take 1 tablet (1 mg total) by mouth daily.  [name] glucose blood (ACCU-CHEK AVIVA PLUS) test strip Check blood sugars four times a day  . Insulin Glargine (LANTUS SOLOSTAR) 100 UNIT/ML Solostar Pen Inject 60 Units into the skin at bedtime.  . Insulin Pen Needle 31G X 5 MM MISC 1 Device by Does not apply route as directed.  [name] levothyroxine (SYNTHROID, LEVOTHROID) 75 MCG tablet Take 1 tablet (75 mcg total) by mouth daily before breakfast.  . metoprolol succinate (TOPROL-XL) 50 MG 24 hr tablet Take 1 tablet (50 mg total) by mouth daily. Take with or immediately following a meal.  . thiamine 100 MG tablet Take 1 tablet (100 mg total) by mouth daily.  . [DISCONTINUED] atorvastatin (LIPITOR) 40 MG tablet Take 1 tablet (40 mg total) by mouth daily at 6 PM for 30 days.  . [DISCONTINUED] Insulin Glargine (LANTUS SOLOSTAR) 100 UNIT/ML Solostar Pen Inject 75 Units into the skin at bedtime.   No facility-administered encounter medications on file as of 08/30/2018.     ALLERGIES: Allergies  Allergen Reactions  . Lisinopril Other (See Comments)    Hyperkalemia / Renal failure    VACCINATION STATUS: Immunization History  Administered Date(s) Administered  . Influenza,inj,Quad PF,6+ Mos 05/20/2015, 04/07/2017,  03/09/2018  . Influenza-Unspecified 05/10/2011  . PPD Test 09/16/2016    Diabetes  He presents for his initial diabetic visit. He has type 2 diabetes mellitus. Onset time: Patient was diagnosed with A1c of greater than 14% in 2017 at the age of 26 years. His disease course has been worsening. There are no hypoglycemic associated symptoms. Pertinent negatives for hypoglycemia include  no confusion, headaches, pallor or seizures. Associated symptoms include blurred vision, polydipsia and polyuria. Pertinent negatives for diabetes include no chest pain, no fatigue, no polyphagia and no weakness. There are no hypoglycemic complications. Symptoms are worsening. Diabetic complications include nephropathy and PVD. 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 Current diabetic treatment includes insulin injections. His weight is decreasing steadily. He is following a generally unhealthy diet. When asked about meal planning, he reported none. He has not had a previous visit with a dietitian. He never participates in exercise. (He did not bring any logs nor meter to review.  His most recent A1c was 12.2% on July 17, 2018.  Did have A1c of greater than 14% in August 2019. -He does have a glucometer, however admits that he has not been using it.) An ACE inhibitor/angiotensin II receptor blocker is not being taken.  Hyperlipidemia  This is a chronic problem. The current episode started more than 1 year ago. The problem is uncontrolled. Exacerbating diseases include diabetes and hypothyroidism. Pertinent negatives include no chest pain, myalgias or shortness of breath. Current antihyperlipidemic treatment includes statins. Risk factors for coronary artery disease include diabetes mellitus,  dyslipidemia and a sedentary lifestyle.  Thyroid Problem  Presents for initial visit. Onset time: He reports thyroidectomy in 2013 due to benign nodular goiter. Patient reports no constipation, diarrhea, fatigue or palpitations. Past treatments include levothyroxine. The treatment provided moderate relief. Prior procedures include thyroidectomy. His past medical history is significant for diabetes and hyperlipidemia.     Review of Systems  Constitutional: Negative for chills, fatigue, fever and unexpected weight change.  HENT: Negative for dental problem, mouth sores and trouble swallowing.   Eyes: Positive for blurred vision. Negative for visual disturbance.  Respiratory: Negative for cough, choking, chest tightness, shortness of breath and wheezing.   Cardiovascular: Negative for chest pain, palpitations and leg swelling.  Gastrointestinal: Negative for abdominal distention, abdominal pain, constipation, diarrhea, nausea and vomiting.  Endocrine: Positive for polydipsia and polyuria. Negative for polyphagia.  Genitourinary: Negative for dysuria, flank pain, hematuria and urgency.  Musculoskeletal: Positive for gait problem. Negative for back pain, myalgias and neck pain.       Wheelchair-bound due to bilateral above-knee amputation as a complication of diabetes and peripheral arterial disease.  Skin: Negative for pallor, rash and wound.  Neurological: Negative for seizures, syncope, weakness, numbness and headaches.  Psychiatric/Behavioral: Negative for confusion and dysphoric mood.    Objective:    BP 132/71   Pulse 68   Wt Readings from Last 3 Encounters:  02/15/18 134 lb 14.7 oz (61.2 kg)  02/05/18 138 lb 14.2 oz (63 kg)  11/30/17 145 lb (65.8 kg)     Physical Exam Constitutional:      General: He is not in acute distress.    Appearance: He is well-developed.  HENT:     Head: Normocephalic and atraumatic.  Neck:     Musculoskeletal: Normal range of motion and neck supple.      Thyroid: No thyromegaly.     Trachea: No tracheal deviation.  Cardiovascular:     Rate and Rhythm: Normal rate.     Pulses:          Dorsalis pedis pulses are 1+ on the right side and 1+ on the left side.       Posterior tibial pulses are  1+ on the right side and 1+ on the left side.     Heart sounds: Normal heart sounds, S1 normal and S2 normal. No murmur. No gallop.   Pulmonary:     Effort: Pulmonary effort is normal. No respiratory distress.     Breath sounds: Wheezing and rales present.  Abdominal:     General: There is no distension.     Tenderness: There is no abdominal tenderness. There is no guarding.  Musculoskeletal:     Right shoulder: He exhibits no swelling and no deformity.     Comments: And is wheelchair-bound due to bilateral above-knee amputation as a complication of diabetes and peripheral arterial disease.  Skin:    General: Skin is warm and dry.     Findings: No rash.     Nails: There is no clubbing.   Neurological:     Mental Status: He is alert and oriented to person, place, and time.     Cranial Nerves: No cranial nerve deficit.     Sensory: No sensory deficit.     Gait: Gait normal.     Deep Tendon Reflexes: Reflexes are normal and symmetric.  Psychiatric:        Speech: Speech normal.        Behavior: Behavior normal. Behavior is cooperative.        Thought Content: Thought content normal.        Judgment: Judgment normal.     Comments: Reluctant affect.     CMP ( most recent) CMP     Component Value Date/Time   NA 136 07/17/2018 0930   K 4.8 07/17/2018 0930   CL 97 07/17/2018 0930   CO2 17 (L) 07/17/2018 0930   GLUCOSE 432 (HH) 07/17/2018 0930   GLUCOSE 108 (H) 02/16/2018 0408   BUN 15 07/17/2018 0930   CREATININE 1.70 (H) 07/17/2018 0930   CALCIUM 9.2 07/17/2018 0930   PROT 9.1 (H) 07/17/2018 0930   ALBUMIN 4.3 07/17/2018 0930   AST 67 (H) 07/17/2018 0930   ALT 44 07/17/2018 0930   ALKPHOS 141 (H) 07/17/2018 0930   BILITOT 0.6  07/17/2018 0930   GFRNONAA 42 (L) 07/17/2018 0930   GFRAA 49 (L) 07/17/2018 0930     Diabetic Labs (most recent): Lab Results  Component Value Date   HGBA1C 12.3 (H) 07/17/2018   HGBA1C >14.0 (H) 02/02/2018   HGBA1C 5.8 10/24/2017     Lipid Panel ( most recent) Lipid Panel     Component Value Date/Time   CHOL 249 (H) 07/17/2018 0930   TRIG 556 (HH) 07/17/2018 0930   HDL 36 (L) 07/17/2018 0930   CHOLHDL 6.9 (H) 07/17/2018 0930   LDLCALC Comment 07/17/2018 0930    Results for COBEY, RAINERI (MRN [id number]) as of 08/30/2018 10:39  Ref. Range 08/15/2017 10:54 10/24/2017 13:31 02/02/2018 11:49  TSH Latest Ref Range: 0.450 - 4.500 uIU/mL 4.900 (H) 6.570 (H) 0.418 (L)  T4,Free(Direct) Latest Ref Range: 0.82 - 1.77 ng/dL 1.43  1.83 (H)     Assessment & Plan:   1. Type 2 diabetes mellitus with stage 4 chronic kidney disease, with long-term current use of insulin ([facility])  - Todd [name] has currently uncontrolled symptomatic type 2 DM since 63 years of age,  with most recent A1c of 12.3 %. Recent labs reviewed.  -This patient has history of heavy alcohol use/abuse which might have contributed to the pathogenesis of his diabetes. - I had a long discussion with him about the progressive nature of  diabetes and the pathology behind its complications. 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 These are all discussed in detail with him.  - I have counseled him on diet management by adopting a carbohydrate restricted/protein rich diet. - he admits that there is a room for improvement in his food and drink choices. - Suggestion is made for him to avoid simple carbohydrates  from his diet including Cakes, Sweet Desserts, Ice Cream, Soda (diet and  regular), Sweet Tea, Candies, Chips, Cookies, Store Bought Juices, Alcohol in Excess of  1-2 drinks a day, Artificial Sweeteners,  Coffee Creamer, and "Sugar-free" Products. This will help patient to have more stable blood glucose profile and potentially avoid unintended weight gain.  - I encouraged him to switch to  unprocessed or minimally processed complex starch and increased protein intake (animal or plant source), fruits, and vegetables.  - he is advised to stick to a routine mealtimes to eat 3 meals  a day and avoid unnecessary snacks ( to snack only to correct hypoglycemia).   - he will be scheduled with Jearld Fenton, RDN, CDE for individualized diabetes education.  - I have approached him with the following individualized plan to manage diabetes and patient agrees:   -Given his current and prevailing glycemic burden, he may require intensive treatment with basal/bolus insulin in order for him to achieve control of diabetes to target.  However, patient seems to have significant cognitive deficit, and reluctant to engage, he will benefit more from simplified treatment regimen.  He will be considered for premixed insulin to use twice a day if he requires more than 1 injection of insulin daily to control his diabetes. -In preparation, he is advised to start monitoring blood glucose 4 times a day- before meals and at bedtime and return in 10 days with his meter and logs for evaluation.  -In the meantime, he is advised to lower his Lantus to 60 units nightly. - he is warned not to take insulin without proper monitoring per orders.  - he is encouraged to call clinic for blood glucose levels less than 70 or above 300 mg /dl. -In this patient with high likelihood of pancreatic diabetes, tight glycemic profile is not advised as he may be lacking endogenous glucagon response which puts him already high risk for hypoglycemia.  - he is not a candidate for metformin, SGLT2 inhibitors due to  concurrent renal insufficiency. -He is not a candidate for incretin therapy due to his background history of alcoholism with possible risk of pancreatitis.  - Patient specific target  A1c;  LDL, HDL, Triglycerides, and  Waist Circumference were discussed in detail.  2) Blood Pressure /Hypertension:  his blood pressure is  controlled to target.   he is advised to continue his current medications including metoprolol 50 mg p.o. daily with breakfast . 3) Lipids/Hyperlipidemia:   Review of his recent lipid panel showed uncontrolled hypertriglyceridemia at 556.  He is advised to continue atorvastatin 40 mg p.o. nightly, discussed and added fenofibrate 145 mg p.o. daily at bedtime.   Side effects and precautions discussed with him.  4)  Weight/Diet:  There is no height or weight on file to calculate BMI.  -   CDE Consult will be initiated . Exercise, and detailed carbohydrates information provided  -  detailed on  discharge instructions.  5) postsurgical hypothyroidism -He underwent total thyroidectomy in 2013 for what appears to be benign nodular goiter. -Based on his recent thyroid function test, he was advised to continue levothyroxine 75 mcg p.o. daily before breakfast.  - We discussed about the correct intake of his thyroid hormone, on empty stomach at fasting, with water, separated by at least 30 minutes from breakfast and other medications,  and separated by more than 4 hours from calcium, iron, multivitamins, acid reflux medications (PPIs). -Patient is made aware of the fact that thyroid hormone replacement is needed for life, dose to be adjusted by periodic monitoring of thyroid function tests.   6) Chronic Care/Health Maintenance:  -he  is on  statin medications and  is encouraged to initiate and continue to follow up with Ophthalmology, Dentist,  Podiatrist at least yearly or according to recommendations, and advised to stay away from smoking. I have recommended yearly flu vaccine and  pneumonia vaccine at least every 5 years; moderate intensity exercise for up to 150 minutes weekly; and  sleep for at least 7 hours a day.  - he is  advised to maintain close follow up with [name], MD for primary care needs, as well as his other providers for optimal and coordinated care.  - Time spent with the patient: 45 minutes, of which >50% was spent in obtaining information about his symptoms, reviewing his previous labs/studies, evaluations, and treatments, counseling him about his currently uncontrolled, complicated type 2 diabetes, postsurgical hypothyroidism, hyperlipidemia, and developing plans for long term treatment based on the latest standards of care/guidelines.  Please refer to " Patient Self Inventory" in the Media  tab for reviewed elements of pertinent patient history.  Todd [name] participated in the discussions, expressed understanding, and voiced agreement with the above plans.  All questions were answered to his satisfaction. he is encouraged to contact clinic should he have any questions or concerns prior to his return visit.  Follow up plan: - Return in about 10 days (around 09/09/2018) for Follow up with Meter and Logs Only - no Labs.  Todd Lloyd, MD [facility] [address] [place], [place] [address] Phone: [phone]  Fax: [phone]    08/30/2018, 10:38 AM  This note was partially dictated with voice recognition software. Similar sounding words can be transcribed inadequately or may not  be corrected upon review.

## 2018-09-13 ENCOUNTER — Ambulatory Visit: Payer: Self-pay | Admitting: "Endocrinology

## 2018-09-14 ENCOUNTER — Ambulatory Visit: Payer: Self-pay | Admitting: "Endocrinology

## 2018-09-27 ENCOUNTER — Other Ambulatory Visit: Payer: Self-pay

## 2018-09-27 ENCOUNTER — Encounter: Payer: Self-pay | Admitting: "Endocrinology

## 2018-09-28 NOTE — Progress Notes (Signed)
This encounter was created in error - please disregard.

## 2018-10-02 ENCOUNTER — Encounter: Payer: Self-pay | Attending: Family Medicine | Admitting: Nutrition

## 2018-10-02 ENCOUNTER — Encounter: Payer: Self-pay | Admitting: Nutrition

## 2018-10-02 ENCOUNTER — Other Ambulatory Visit: Payer: Self-pay

## 2018-10-02 DIAGNOSIS — E782 Mixed hyperlipidemia: Secondary | ICD-10-CM | POA: Insufficient documentation

## 2018-10-02 DIAGNOSIS — Z89612 Acquired absence of left leg above knee: Secondary | ICD-10-CM | POA: Insufficient documentation

## 2018-10-02 DIAGNOSIS — N183 Chronic kidney disease, stage 3 unspecified: Secondary | ICD-10-CM

## 2018-10-02 DIAGNOSIS — E1165 Type 2 diabetes mellitus with hyperglycemia: Secondary | ICD-10-CM | POA: Insufficient documentation

## 2018-10-02 DIAGNOSIS — Z89611 Acquired absence of right leg above knee: Secondary | ICD-10-CM | POA: Insufficient documentation

## 2018-10-02 DIAGNOSIS — E118 Type 2 diabetes mellitus with unspecified complications: Secondary | ICD-10-CM | POA: Insufficient documentation

## 2018-10-02 DIAGNOSIS — IMO0002 Reserved for concepts with insufficient information to code with codable children: Secondary | ICD-10-CM

## 2018-10-02 NOTE — Progress Notes (Signed)
Medical Nutrition Therapy:  Appt start time: 1030 end time:  1100.   Assessment:  Primary concerns today: Diabetes Type 2. Is a double amputee. H/o  CKD Stg 4, CAD and Mixed Hyperlipidemia. . Lives with his mother and has an aide that comes in to cook, clean and take care of personal needs. He doesn't have any children that help him.Todd [name] He doesn't have a good social support system. He doesn't have any insurance. He won't get Medicare til December 2020. Eats 1-2 meals per day. Scheduled to see Dr. Dorris Fetch. PCP Dr. Livia Snellen.  Has prostetics but needs 2 people to  help getting them on to use them.  He has been losing weight; 145-134 lbs.  Takes 60 units of lantus daily at night. Gives in the stomach.. FBS 150-175 and Bedtime: 200's. Denies low blood sugars.  Diet is high in fat and sodium. Needs a higher fiber, lower fat, lower sodium CHO balanced meal plan to better control his DM and reduced further problems with his complications.  Will try to talk to his aide to educate her on his needs for his diet and improved health since she cooks and shops for him and his mother. Will try to see if there are some community resources of meal assistance. He would benefit from home visits from [facility] since he is unable to use his prosthesis due to lack of help to get them on.   I have contacted [place], [facility] P[facility] for referral to do an assessment and assist with identifying his psychosocial, medical, financial and food needs.   Lab Results  Component Value Date   HGBA1C 12.3 (H) 07/17/2018   CMP Latest Ref Rng & Units 07/17/2018 03/09/2018 03/02/2018  Glucose 65 - 99 mg/dL 432(HH) 383(H) 352(H)  BUN 8 - 27 mg/dL 15 14 13   Creatinine 0.76 - 1.27 mg/dL 1.70(H) 1.33(H) 1.12  Sodium 134 - 144 mmol/L 136 136 131(L)  Potassium 3.5 - 5.2 mmol/L 4.8 4.9 4.5  Chloride 96 - 106 mmol/L 97 98 97  CO2 20 - 29 mmol/L 17(L) 19(L) 21  Calcium 8.6 - 10.2 mg/dL 9.2 9.7 9.8  Total Protein 6.0 - 8.5 g/dL 9.1(H)  8.7(H) -  Total Bilirubin 0.0 - 1.2 mg/dL 0.6 0.3 -  Alkaline Phos 39 - 117 IU/L 141(H) 174(H) -  AST 0 - 40 IU/L 67(H) 25 -  ALT 0 - 44 IU/L 44 21 -   Lipid Panel     Component Value Date/Time   CHOL 249 (H) 07/17/2018 0930   TRIG 556 (HH) 07/17/2018 0930   HDL 36 (L) 07/17/2018 0930   CHOLHDL 6.9 (H) 07/17/2018 0930   LDLCALC Comment 07/17/2018 0930     Preferred Learning Style:  Auditory  Visual  Hands on   Learning Readiness:   Reading  Change in progress   MEDICATIONS:    DIETARY INTAKE:  Usual eating patter  24-hr recall:  B ( AM):boiled egg, bacon and eggs   Snk ( AM): nabs or diet soda or chips L ( PM): cabbage, fried chicken, Gatorade Snk ( PM): grapes, water D ( PM): same as lunch, water Snk ( PM):  Beverages: water, diet sodas  Usual physical activity:   Estimated energy needs: 1600  calories 180 g carbohydrates 120 g protein 44 g fat  Progress Towards Goal(s):  In progress.   Nutritional Diagnosis:  NB-1.2 Harmful beliefs/attitudes about food or nutrition-related topics (use with caution) As related to Diabetes Type 2, CAD, CKD.  As  evidenced by A1C > 14%, TG > 500, CHOL > 200 and  EGFR< 30..    Intervention:Nutrition and Diabetes education provided on My Plate, CHO counting, meal planning, portion sizes, timing of meals, avoiding snacks between meals unless having a low blood sugar, target ranges for A1C and blood sugars, signs/symptoms and treatment of hyper/hypoglycemia, monitoring blood sugars, taking medications as prescribed, benefits of exercising 30 minutes per day and prevention of complications of DM. Todd KitchenGoals 1 Eat three meals per day and don't  Skip meals. 2. Add fruit and toast or oatmeal with eggs at breakfast 3. Cut out fried foods and eat more baked and broiled foods 4. Increase Carbs to 3 servings per meal 5 Drink water 6. Cut out processed high salt foods. Prevent Hypoglycemia Get A1C down to 7.5-8%   Teaching Method  Utilized: Visual Auditory Hands on  Handouts given during visit include: Verbally discussed the plate method  Barriers to learning/adherence to lifestyle change: non ambulatory, no insurance, limted access to resources and lack of social support.  Demonstrated degree of understanding via:  Teach Back   Monitoring/Evaluation:  Dietary intake, exercise, and body weight in 1 month(s).Will try to talk to his aide to educate her on his needs for his diet and improved health since she cooks and shops for him and his mother. He would benefit from [facility] services for home visits due to lack of transportation and ambulation.   I have contacted [place], [facility] P[facility] for referral to do an assessment and assist with identifying his psychosocial, medical, financial and food needs.

## 2018-10-02 NOTE — Patient Instructions (Addendum)
Goals 1 Eat three meals per day and don't  Skip meals. 2. Add fruit and toast or oatmeal with eggs at breakfast 3. Cut out fried foods and eat more baked and broiled foods 4. Increase Carbs to 3 servings per meal 5 Drink water 6. Cut out processed high salt foods. Prevent Hypoglycemia Get A1C down to 7.5-8%

## 2018-10-09 ENCOUNTER — Ambulatory Visit (INDEPENDENT_AMBULATORY_CARE_PROVIDER_SITE_OTHER): Payer: Self-pay | Admitting: Family Medicine

## 2018-10-09 ENCOUNTER — Other Ambulatory Visit: Payer: Self-pay

## 2018-10-09 ENCOUNTER — Encounter: Payer: Self-pay | Admitting: Family Medicine

## 2018-10-09 DIAGNOSIS — E1165 Type 2 diabetes mellitus with hyperglycemia: Secondary | ICD-10-CM

## 2018-10-09 NOTE — Progress Notes (Signed)
    Subjective:    Patient ID: Todd [name], male    DOB: 08/23/1955, 63 y.o.   MRN: [id number]   HPI: Todd [name] is a 63 y.o. male presenting for glucose running too high. Talked to Dr. Dorris Fetch last week. They didn't want him to come in, but decreased his insulin from 75 units to 60 units.  Denies any symptoms. See ROS   Depression screen [facility] 2/9 10/02/2018 07/17/2018 05/14/2018 03/15/2018 03/09/2018  Decreased Interest 0 0 0 0 0  Down, Depressed, Hopeless 0 0 0 0 0  PHQ - 2 Score 0 0 0 0 0     Relevant past medical, surgical, family and social history reviewed and updated as indicated.  Interim medical history since our last visit reviewed. Allergies and medications reviewed and updated.  ROS:  Review of Systems  Constitutional: Negative for fever.  Respiratory: Negative for shortness of breath.   Cardiovascular: Negative for chest pain.  Gastrointestinal: Negative for nausea.  Musculoskeletal: Negative for arthralgias.  Skin: Negative for rash.  Neurological: Negative for dizziness and weakness.     Social History   Tobacco Use  Smoking Status Current Every Day Smoker  . Packs/day: 0.25  . Years: 45.00  . Pack years: 11.25  . Types: Cigarettes  . Last attempt to quit: 11/12/2014  . Years since quitting: 3.9  Smokeless Tobacco Never Used       Objective:     Wt Readings from Last 3 Encounters:  02/15/18 134 lb 14.7 oz (61.2 kg)  02/05/18 138 lb 14.2 oz (63 kg)  11/30/17 145 lb (65.8 kg)     Exam deferred. Pt. Harboring due to COVID 19. Phone visit performed.   Assessment & Plan:   1. Uncontrolled type 2 diabetes mellitus with hyperglycemia ([place])     No orders of the defined types were placed in this encounter.   No orders of the defined types were placed in this encounter.     Diagnoses and all orders for this visit:  Uncontrolled type 2 diabetes mellitus with hyperglycemia ([place])    Virtual Visit via telephone Note  I discussed the  limitations, risks, security and privacy concerns of performing an evaluation and management service by telephone and the availability of in person appointments. The patient was identified with two identifiers. Pt.expressed understanding and agreed to proceed. Pt. Is at home. Dr. Livia Snellen is in his office.  Follow Up Instructions:   I discussed the assessment and treatment plan with the patient. The patient was provided an opportunity to ask questions and all were answered. The patient agreed with the plan and demonstrated an understanding of the instructions.   The patient was advised to call back or seek an in-person evaluation if the symptoms worsen or if the condition fails to improve as anticipated.  Visit started: 6:30 Call ended:  6:35 Total minutes including chart review and phone contact time: 15  Follow up plan: Since Dr. Dorris Fetch is now managing Pt.s diabetes, I advised that he get in touch with him tomorrow to clarify treatment. It doesn't seem likely that he would decrease the insulin dose if the glucose is running over 300. Pt. Should seek clarification. No telephone note in chart to use to clarify this discrepancy.  Return if symptoms worsen or fail to improve.  [name], MD [place]

## 2018-10-12 ENCOUNTER — Telehealth: Payer: Self-pay | Admitting: Family Medicine

## 2018-10-12 ENCOUNTER — Ambulatory Visit: Payer: Self-pay | Admitting: "Endocrinology

## 2018-10-12 NOTE — Telephone Encounter (Signed)
Patient's sister would like to talk to Dr. Artemio Aly about his phone call to her brother. Aware Dr. Artemio Aly is out of the office and will return Monday- please call

## 2018-10-12 NOTE — Telephone Encounter (Signed)
Pt sister is calling wanting to talk to nurse about what was discussed on the televist with Dr Livia Snellen on Tuesday also wants to know why apt on 5/5 was cancelled by the provider.

## 2018-10-16 ENCOUNTER — Ambulatory Visit: Payer: Self-pay | Admitting: Family Medicine

## 2018-11-06 ENCOUNTER — Ambulatory Visit: Payer: Self-pay | Admitting: "Endocrinology

## 2018-11-08 ENCOUNTER — Ambulatory Visit: Payer: Self-pay | Admitting: "Endocrinology

## 2018-11-08 ENCOUNTER — Other Ambulatory Visit: Payer: Self-pay

## 2018-11-08 ENCOUNTER — Other Ambulatory Visit: Payer: Self-pay | Admitting: "Endocrinology

## 2018-11-08 DIAGNOSIS — Z794 Long term (current) use of insulin: Secondary | ICD-10-CM

## 2018-11-08 DIAGNOSIS — E1122 Type 2 diabetes mellitus with diabetic chronic kidney disease: Secondary | ICD-10-CM

## 2018-11-08 DIAGNOSIS — E039 Hypothyroidism, unspecified: Secondary | ICD-10-CM

## 2018-11-13 ENCOUNTER — Other Ambulatory Visit: Payer: Self-pay

## 2018-11-14 ENCOUNTER — Other Ambulatory Visit: Payer: Self-pay

## 2018-11-15 LAB — COMPREHENSIVE METABOLIC PANEL
ALT: 26 IU/L (ref 0–44)
AST: 42 IU/L — ABNORMAL HIGH (ref 0–40)
Albumin/Globulin Ratio: 0.8 — ABNORMAL LOW (ref 1.2–2.2)
Albumin: 3.8 g/dL (ref 3.8–4.8)
Alkaline Phosphatase: 151 IU/L — ABNORMAL HIGH (ref 39–117)
BUN/Creatinine Ratio: 7 — ABNORMAL LOW (ref 10–24)
BUN: 8 mg/dL (ref 8–27)
Bilirubin Total: 0.5 mg/dL (ref 0.0–1.2)
CO2: 21 mmol/L (ref 20–29)
Calcium: 8.3 mg/dL — ABNORMAL LOW (ref 8.6–10.2)
Chloride: 97 mmol/L (ref 96–106)
Creatinine, Ser: 1.18 mg/dL (ref 0.76–1.27)
GFR calc Af Amer: 75 mL/min/{1.73_m2} (ref >59)
GFR calc non Af Amer: 65 mL/min/{1.73_m2} (ref >59)
Globulin, Total: 4.6 g/dL — ABNORMAL HIGH (ref 1.5–4.5)
Glucose: 291 mg/dL — ABNORMAL HIGH (ref 65–99)
Potassium: 3.9 mmol/L (ref 3.5–5.2)
Sodium: 140 mmol/L (ref 134–144)
Total Protein: 8.4 g/dL (ref 6.0–8.5)

## 2018-11-15 LAB — HEMOGLOBIN A1C
Est. average glucose Bld gHb Est-mCnc: 364 mg/dL
Hgb A1c MFr Bld: 14.3 % — ABNORMAL HIGH (ref 4.8–5.6)

## 2018-11-15 LAB — TSH: TSH: 16.64 u[IU]/mL — ABNORMAL HIGH (ref 0.450–4.500)

## 2018-11-15 LAB — T4, FREE: Free T4: 1.17 ng/dL (ref 0.82–1.77)

## 2018-11-20 ENCOUNTER — Ambulatory Visit: Payer: Self-pay | Admitting: "Endocrinology

## 2018-11-22 ENCOUNTER — Ambulatory Visit: Payer: Self-pay | Admitting: "Endocrinology

## 2018-11-26 ENCOUNTER — Telehealth: Payer: Self-pay | Admitting: Family Medicine

## 2018-11-27 ENCOUNTER — Other Ambulatory Visit: Payer: Self-pay

## 2018-11-27 ENCOUNTER — Ambulatory Visit (INDEPENDENT_AMBULATORY_CARE_PROVIDER_SITE_OTHER): Payer: Self-pay | Admitting: "Endocrinology

## 2018-11-27 ENCOUNTER — Encounter: Payer: Self-pay | Admitting: "Endocrinology

## 2018-11-27 VITALS — BP 173/97 | HR 78

## 2018-11-27 DIAGNOSIS — E1122 Type 2 diabetes mellitus with diabetic chronic kidney disease: Secondary | ICD-10-CM

## 2018-11-27 DIAGNOSIS — I10 Essential (primary) hypertension: Secondary | ICD-10-CM

## 2018-11-27 DIAGNOSIS — E039 Hypothyroidism, unspecified: Secondary | ICD-10-CM

## 2018-11-27 DIAGNOSIS — E782 Mixed hyperlipidemia: Secondary | ICD-10-CM

## 2018-11-27 DIAGNOSIS — Z794 Long term (current) use of insulin: Secondary | ICD-10-CM

## 2018-11-27 DIAGNOSIS — N184 Chronic kidney disease, stage 4 (severe): Secondary | ICD-10-CM

## 2018-11-27 MED ORDER — LEVOTHYROXINE SODIUM 100 MCG PO TABS: 100.0000 ug | ORAL_TABLET | Freq: Every day | ORAL | 2 refills | Status: DC

## 2018-11-27 NOTE — Progress Notes (Signed)
Endocrinology follow-up note       11/27/2018, 2:20 PM   Subjective:    Patient ID: Todd [name], male    DOB: 04/01/1956.  Todd [name] is being seen in follow-up for management of currently uncontrolled symptomatic diabetes requested by  [name], MD.   Past Medical History:  Diagnosis Date  . AKI (acute kidney injury) ([place])   . Constipated   . Diabetes mellitus without complication ([place])   . Diarrhea   . Elevated LFTs   . Goiter   . Gout   . Hyperlipidemia   . Hypertension   . Leukocytosis   . Reactive thrombocytosis   . Right BKA infection ([place]) 08/2016  . Right leg pain   . Sepsis due to undetermined organism ([place])   . Thyroid disease   . Wound infection after surgery 08/2016    Past Surgical History:  Procedure Laterality Date  . ABDOMINAL AORTOGRAM N/A 08/11/2016   Procedure: Abdominal Aortogram;  Surgeon: Waynetta Sandy, MD;  Location: [place] [facility];  Service: Cardiovascular;  Laterality: N/A;  . ABDOMINAL AORTOGRAM W/LOWER EXTREMITY N/A 08/15/2016   Procedure: Abdominal Aortogram w/Lower Extremity;  Surgeon: Elam Dutch, MD;  Location: [place] [facility];  Service: Cardiovascular;  Laterality: N/A;  . AMPUTATION Right 08/17/2016   Procedure: RIGHT BELOW KNEE AMPUTATION;  Surgeon: Elam Dutch, MD;  Location: [place];  Service: Vascular;  Laterality: Right;  . AMPUTATION Right 09/12/2016   Procedure: AMPUTATION ABOVE KNEE;  Surgeon: Newt Minion, MD;  Location: [place];  Service: Orthopedics;  Laterality: Right;  . AMPUTATION Left 08/12/2016   Procedure: LEFT BELOW KNEE AMPUTATION;  Surgeon: Newt Minion, MD;  Location: [place];  Service: Orthopedics;  Laterality: Left;  . AMPUTATION Left 11/01/2017   Procedure: LEFT ABOVE KNEE AMPUTATION;  Surgeon: Newt Minion, MD;  Location: [place];  Service: Orthopedics;  Laterality: Left;  . APPLICATION OF WOUND VAC Right  09/12/2016   Procedure: APPLICATION OF WOUND VAC ABOVE KNEE;  Surgeon: Newt Minion, MD;  Location: [place];  Service: Orthopedics;  Laterality: Right;  . LOWER EXTREMITY ANGIOGRAPHY Bilateral 08/11/2016   Procedure: Lower Extremity Angiography;  Surgeon: Waynetta Sandy, MD;  Location: [place] [facility];  Service: Cardiovascular;  Laterality: Bilateral;  . PERIPHERAL VASCULAR BALLOON ANGIOPLASTY Left 08/11/2016   Procedure: Peripheral Vascular Balloon Angioplasty;  Surgeon: Waynetta Sandy, MD;  Location: [place] [facility];  Service: Cardiovascular;  Laterality: Left;  SFA  . THYROID SURGERY      Social History   Socioeconomic History  . Marital status: Single    Spouse name: Not on file  . Number of children: Not on file  . Years of education: Not on file  . Highest education level: Not on file  Occupational History  . Occupation: drives a Visual merchandiser  . Financial resource strain: Not on file  . Food insecurity    Worry: Not on file    Inability: Not on file  . Transportation needs    Medical: Not on file    Non-medical: Not on file  Tobacco  Use  . Smoking status: Current Every Day Smoker    Packs/day: 0.25    Years: 45.00    Pack years: 11.25    Types: Cigarettes    Last attempt to quit: 11/12/2014    Years since quitting: 4.0  . Smokeless tobacco: Never Used  Substance and Sexual Activity  . Alcohol use: Not Currently    Alcohol/week: 0.0 standard drinks  . Drug use: No  . Sexual activity: Not Currently  Lifestyle  . Physical activity    Days per week: Not on file    Minutes per session: Not on file  . Stress: Not on file  Relationships  . Social Herbalist on phone: Not on file    Gets together: Not on file    Attends religious service: Not on file    Active member of club or organization: Not on file    Attends meetings of clubs or organizations: Not on file    Relationship status: Not on file  Other Topics Concern  . Not on  file  Social History Narrative  . Not on file    Family History  Problem Relation Age of Onset  . Heart disease Mother   . Pneumonia Father   . Diabetes Maternal Aunt   . Diabetes Maternal Uncle     Outpatient Encounter Medications as of 11/27/2018  Medication Sig  . aspirin EC 81 MG tablet Take 81 mg by mouth daily.  [name] atorvastatin (LIPITOR) 40 MG tablet Take 1 tablet (40 mg total) by mouth at bedtime.  . blood glucose meter kit and supplies Dispense based on patient and insurance preference. Use up to four times daily as directed. (FOR ICD-10 E10.9, E11.9).  . fenofibrate (TRICOR) 145 MG tablet Take 1 tablet (145 mg total) by mouth daily.  . folic acid (FOLVITE) 1 MG tablet Take 1 tablet (1 mg total) by mouth daily.  [name] glucose blood (ACCU-CHEK AVIVA PLUS) test strip Check blood sugars four times a day  . Insulin Glargine (LANTUS SOLOSTAR) 100 UNIT/ML Solostar Pen Inject 60 Units into the skin at bedtime.  . Insulin Pen Needle 31G X 5 MM MISC 1 Device by Does not apply route as directed.  [name] levothyroxine (SYNTHROID) 100 MCG tablet Take 1 tablet (100 mcg total) by mouth daily before breakfast.  . metoprolol succinate (TOPROL-XL) 50 MG 24 hr tablet Take 1 tablet (50 mg total) by mouth daily. Take with or immediately following a meal.  . thiamine 100 MG tablet Take 1 tablet (100 mg total) by mouth daily.  . [DISCONTINUED] levothyroxine (SYNTHROID, LEVOTHROID) 75 MCG tablet Take 1 tablet (75 mcg total) by mouth daily before breakfast.   No facility-administered encounter medications on file as of 11/27/2018.     ALLERGIES: Allergies  Allergen Reactions  . Lisinopril Other (See Comments)    Hyperkalemia / Renal failure    VACCINATION STATUS: Immunization History  Administered Date(s) Administered  . Influenza,inj,Quad PF,6+ Mos 05/20/2015, 04/07/2017, 03/09/2018  . Influenza-Unspecified 05/10/2011  . PPD Test 09/16/2016    Diabetes He presents for his follow-up diabetic  visit. He has type 2 diabetes mellitus. Onset time: Patient was diagnosed with A1c of greater than 14% in 2017 at the age of 58 years. His disease course has been worsening. There are no hypoglycemic associated symptoms. Pertinent negatives for hypoglycemia include no confusion, headaches, pallor or seizures. Associated symptoms include blurred vision, polydipsia and polyuria. Pertinent negatives for diabetes include no chest pain, no fatigue,  no polyphagia and no weakness. There are no hypoglycemic complications. Symptoms are worsening. Diabetic complications include nephropathy and PVD. 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 Current diabetic treatment includes insulin injections (He cannot commit to whether he is consistent on Lantus 60 units nightly.). His weight is decreasing steadily. He is following a generally unhealthy diet. When asked about meal planning, he reported none. He has not had a previous visit with a dietitian. He never participates in exercise. (He did not bring any logs nor meter.  His previsit labs show A1c of 14.3% increasing from 12.2%.   ) An ACE inhibitor/angiotensin II receptor blocker is not being taken.  Hyperlipidemia This is a chronic problem. The current episode started more than 1 year ago. The problem is uncontrolled. Exacerbating diseases include diabetes and hypothyroidism. Pertinent negatives include no chest pain, myalgias or shortness of breath. Current antihyperlipidemic treatment includes statins. Risk factors for coronary artery disease include diabetes mellitus, dyslipidemia and a sedentary lifestyle.  Thyroid Problem Presents for initial visit. Onset time: He reports thyroidectomy in 2013 due to benign nodular goiter. Patient reports  no constipation, diarrhea, fatigue or palpitations. Past treatments include levothyroxine. The treatment provided moderate relief. Prior procedures include thyroidectomy. His past medical history is significant for diabetes and hyperlipidemia.     Review of Systems  Constitutional: Negative for chills, fatigue, fever and unexpected weight change.  HENT: Negative for dental problem, mouth sores and trouble swallowing.   Eyes: Positive for blurred vision. Negative for visual disturbance.  Respiratory: Negative for cough, choking, chest tightness, shortness of breath and wheezing.   Cardiovascular: Negative for chest pain, palpitations and leg swelling.  Gastrointestinal: Negative for abdominal distention, abdominal pain, constipation, diarrhea, nausea and vomiting.  Endocrine: Positive for polydipsia and polyuria. Negative for polyphagia.  Genitourinary: Negative for dysuria, flank pain, hematuria and urgency.  Musculoskeletal: Positive for gait problem. Negative for back pain, myalgias and neck pain.       Wheelchair-bound due to bilateral above-knee amputation as a complication of diabetes and peripheral arterial disease.  Skin: Negative for pallor, rash and wound.  Neurological: Negative for seizures, syncope, weakness, numbness and headaches.  Psychiatric/Behavioral: Negative for confusion and dysphoric mood.    Objective:    BP (!) 173/97   Pulse 78   SpO2 98%   Wt Readings from Last 3 Encounters:  02/15/18 134 lb 14.7 oz (61.2 kg)  02/05/18 138 lb 14.2 oz (63 kg)  11/30/17 145 lb (65.8 kg)     Physical Exam Constitutional:      General: He is not in acute distress.    Appearance: He is well-developed.  HENT:     Head: Normocephalic and atraumatic.  Neck:     Musculoskeletal: Normal range of motion and neck supple.     Thyroid: No thyromegaly.     Trachea: No tracheal deviation.  Cardiovascular:     Rate and Rhythm: Normal rate.     Pulses:          Dorsalis pedis  pulses are 1+ on the right side and 1+ on the left side.       Posterior tibial pulses are 1+ on the right side and 1+ on the left side.     Heart sounds: Normal heart sounds, S1 normal and S2 normal. No murmur. No gallop.   Pulmonary:  Effort: Pulmonary effort is normal.     Breath sounds: No wheezing or rales.  Abdominal:     General: There is no distension.     Tenderness: There is no abdominal tenderness. There is no guarding.  Musculoskeletal:     Right shoulder: He exhibits no swelling and no deformity.     Comments: And is wheelchair-bound due to bilateral above-knee amputation as a complication of diabetes and peripheral arterial disease.  Skin:    General: Skin is warm and dry.     Findings: No rash.     Nails: There is no clubbing.   Neurological:     Mental Status: He is alert and oriented to person, place, and time.     Cranial Nerves: No cranial nerve deficit.     Sensory: No sensory deficit.     Gait: Gait normal.     Deep Tendon Reflexes: Reflexes are normal and symmetric.  Psychiatric:        Speech: Speech normal.        Behavior: Behavior is cooperative.     Comments: Reluctant affect.     Diabetic Labs (most recent): Lab Results  Component Value Date   HGBA1C 14.3 (H) 11/14/2018   HGBA1C 12.3 (H) 07/17/2018   HGBA1C >14.0 (H) 02/02/2018     Lipid Panel ( most recent) Lipid Panel     Component Value Date/Time   CHOL 249 (H) 07/17/2018 0930   TRIG 556 (HH) 07/17/2018 0930   HDL 36 (L) 07/17/2018 0930   CHOLHDL 6.9 (H) 07/17/2018 0930   LDLCALC Comment 07/17/2018 0930     Recent Results (from the past 2160 hour(s))  T4, Free     Status: None   Collection Time: 11/14/18  9:48 AM  Result Value Ref Range   Free T4 1.17 0.82 - 1.77 ng/dL  Hemoglobin A1c     Status: Abnormal   Collection Time: 11/14/18  9:48 AM  Result Value Ref Range   Hgb A1c MFr Bld 14.3 (H) 4.8 - 5.6 %    Comment:          Prediabetes: 5.7 - 6.4          Diabetes: >6.4           Glycemic control for adults with diabetes: <7.0    Est. average glucose Bld gHb Est-mCnc 364 mg/dL  Comprehensive metabolic panel     Status: Abnormal   Collection Time: 11/14/18  9:48 AM  Result Value Ref Range   Glucose 291 (H) 65 - 99 mg/dL   BUN 8 8 - 27 mg/dL   Creatinine, Ser 1.18 0.76 - 1.27 mg/dL   GFR calc non Af Amer 65 >59 mL/min/1.73   GFR calc Af Amer 75 >59 mL/min/1.73   BUN/Creatinine Ratio 7 (L) 10 - 24   Sodium 140 134 - 144 mmol/L   Potassium 3.9 3.5 - 5.2 mmol/L   Chloride 97 96 - 106 mmol/L   CO2 21 20 - 29 mmol/L   Calcium 8.3 (L) 8.6 - 10.2 mg/dL   Total Protein 8.4 6.0 - 8.5 g/dL   Albumin 3.8 3.8 - 4.8 g/dL   Globulin, Total 4.6 (H) 1.5 - 4.5 g/dL   Albumin/Globulin Ratio 0.8 (L) 1.2 - 2.2   Bilirubin Total 0.5 0.0 - 1.2 mg/dL   Alkaline Phosphatase 151 (H) 39 - 117 IU/L   AST 42 (H) 0 - 40 IU/L   ALT 26 0 - 44 IU/L  TSH  Status: Abnormal   Collection Time: 11/14/18  9:48 AM  Result Value Ref Range   TSH 16.640 (H) 0.450 - 4.500 uIU/mL       Assessment & Plan:   1. Type 2 diabetes mellitus with stage 4 chronic kidney disease, with long-term current use of insulin ([facility])  - Todd [name] has currently uncontrolled symptomatic type 2 DM since 63 years of age. -After missing his appointment, returns with no meter nor logs.  His previsit labs show A1c of 14.3% increasing from 12.3%.  He denies hypoglycemia.   -This patient has history of heavy alcohol use/abuse which might have contributed to the pathogenesis of his diabetes. - I had a long discussion with him about the progressive nature of diabetes and the pathology behind its complications. 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 These are all  discussed in detail with him.  - I have counseled him on diet management by adopting a carbohydrate restricted/protein rich diet.  - he  admits there is a room for improvement in his diet and drink choices. -  Suggestion is made for him to avoid simple carbohydrates  from his diet including Cakes, Sweet Desserts / Pastries, Ice Cream, Soda (diet and regular), Sweet Tea, Candies, Chips, Cookies, Sweet Pastries,  Store Bought Juices, Alcohol in Excess of  1-2 drinks a day, Artificial Sweeteners, Coffee Creamer, and "Sugar-free" Products. This will help patient to have stable blood glucose profile and potentially avoid unintended weight gain.   - I encouraged him to switch to  unprocessed or minimally processed complex starch and increased protein intake (animal or plant source), fruits, and vegetables.  - he is advised to stick to a routine mealtimes to eat 3 meals  a day and avoid unnecessary snacks ( to snack only to correct hypoglycemia).   - he will be scheduled with Jearld Fenton, RDN, CDE for individualized diabetes education.  - I have approached him with the following individualized plan to manage diabetes and patient agrees:   -Given his current and prevailing glycemic burden, he may require intensive treatment with basal/bolus insulin in order for him to achieve control of diabetes to target.  However, patient remains uncommitted for proper use of insulin/monitoring, seems to have significant cognitive deficit, and reluctant to engage, he will benefit more from simplified treatment regimen.    -In preparation, for one last attempt, he is approached to start monitoring blood glucose 4 times a day- before meals and at bedtime and return in 10 days with his meter and logs for evaluation.  -In the meantime, he is advised to resume Lantus  60 units nightly. - he is warned not to take insulin without proper monitoring per orders.  - he is encouraged to call clinic for blood glucose levels  less than 70 or above 300 mg /dl. -He will be considered for premixed insulin if he ends up requiring more than 1 injection of insulin daily. -In this patient with high likelihood of pancreatic diabetes, tight glycemic profile is not advised as he may be lacking endogenous glucagon response which puts him already high risk for hypoglycemia.  - he is not a candidate for metformin, SGLT2 inhibitors due to concurrent renal insufficiency. -He is not a candidate for incretin therapy due to his background history of alcoholism with  possible risk of pancreatitis.  He is not a suitable candidate for sulfonylurea medications either.  - Patient specific target  A1c;  LDL, HDL, Triglycerides, and  Waist Circumference were discussed in detail.  2) Blood Pressure /Hypertension: His blood pressure is not controlled to target.  he is advised to continue his current medications including metoprolol 50 mg p.o. daily with breakfast .  3) Lipids/Hyperlipidemia:   Review of his recent lipid panel showed uncontrolled hypertriglyceridemia at 556.  He is advised to continue atorvastatin 40 mg p.o. nightly, discussed and added fenofibrate 145 mg p.o. daily at bedtime.   Side effects and precautions discussed with him.  4)  Weight/Diet:  There is no height or weight on file to calculate BMI.  -   CDE Consult will be initiated . Exercise, and detailed carbohydrates information provided  -  detailed on discharge instructions.  5) postsurgical hypothyroidism -He underwent total thyroidectomy in 2013 for what appears to be benign nodular goiter. -Based on his recent thyroid function test, he will benefit from a higher dose of levothyroxine.  I discussed increase his levothyroxine to 100 mcg p.o. every morning.    - We discussed about the correct intake of his thyroid hormone, on empty stomach at fasting, with water, separated by at least 30 minutes from breakfast and other medications,  and separated by more than 4 hours from  calcium, iron, multivitamins, acid reflux medications (PPIs). -Patient is made aware of the fact that thyroid hormone replacement is needed for life, dose to be adjusted by periodic monitoring of thyroid function tests.   6) Chronic Care/Health Maintenance:  -he  is on  statin medications and  is encouraged to initiate and continue to follow up with Ophthalmology, Dentist,  Podiatrist at least yearly or according to recommendations, and advised to stay away from smoking. I have recommended yearly flu vaccine and pneumonia vaccine at least every 5 years; moderate intensity exercise for up to 150 minutes weekly; and  sleep for at least 7 hours a day.  - he is  advised to maintain close follow up with [name], MD for primary care needs, as well as his other providers for optimal and coordinated care.  - Patient Care Time Today:  25 min, of which >50% was spent in reviewing his  current and  previous labs/studies, previous treatments, and medications doses and developing a plan for long-term care based on the latest recommendations for standards of care.  Todd [name] participated in the discussions, expressed understanding, and voiced agreement with the above plans.  All questions were answered to his satisfaction. he is encouraged to contact clinic should he have any questions or concerns prior to his return visit.   Follow up plan: - Return in about 10 days (around 12/07/2018) for Meter, and Logs, Follow up with Meter and Logs Only - no Labs.  Todd Lloyd, MD [facility] [address] [place], [place] [address] Phone: [phone]  Fax: [phone]    11/27/2018, 2:20 PM  This note was partially dictated with voice recognition software. Similar sounding words can be transcribed inadequately or may not  be corrected upon review.

## 2018-12-10 ENCOUNTER — Ambulatory Visit (INDEPENDENT_AMBULATORY_CARE_PROVIDER_SITE_OTHER): Payer: Self-pay | Admitting: "Endocrinology

## 2018-12-10 ENCOUNTER — Encounter: Payer: Self-pay | Admitting: "Endocrinology

## 2018-12-10 ENCOUNTER — Other Ambulatory Visit: Payer: Self-pay

## 2018-12-10 VITALS — BP 153/88 | HR 77

## 2018-12-10 DIAGNOSIS — Z794 Long term (current) use of insulin: Secondary | ICD-10-CM

## 2018-12-10 DIAGNOSIS — E1122 Type 2 diabetes mellitus with diabetic chronic kidney disease: Secondary | ICD-10-CM

## 2018-12-10 DIAGNOSIS — E782 Mixed hyperlipidemia: Secondary | ICD-10-CM

## 2018-12-10 DIAGNOSIS — Z91199 Patient's noncompliance with other medical treatment and regimen due to unspecified reason: Secondary | ICD-10-CM

## 2018-12-10 DIAGNOSIS — E039 Hypothyroidism, unspecified: Secondary | ICD-10-CM

## 2018-12-10 DIAGNOSIS — I10 Essential (primary) hypertension: Secondary | ICD-10-CM

## 2018-12-10 DIAGNOSIS — Z9119 Patient's noncompliance with other medical treatment and regimen: Secondary | ICD-10-CM

## 2018-12-10 DIAGNOSIS — N184 Chronic kidney disease, stage 4 (severe): Secondary | ICD-10-CM

## 2018-12-10 NOTE — Progress Notes (Signed)
Endocrinology follow-up note       12/10/2018, 1:05 PM   Subjective:    Patient ID: Todd [name], male    DOB: 03/23/1956.  Todd [name] is being seen in follow-up for management of currently uncontrolled symptomatic diabetes requested by  [name], MD.   Past Medical History:  Diagnosis Date  . AKI (acute kidney injury) ([place])   . Constipated   . Diabetes mellitus without complication ([place])   . Diarrhea   . Elevated LFTs   . Goiter   . Gout   . Hyperlipidemia   . Hypertension   . Leukocytosis   . Reactive thrombocytosis   . Right BKA infection ([place]) 08/2016  . Right leg pain   . Sepsis due to undetermined organism ([place])   . Thyroid disease   . Wound infection after surgery 08/2016    Past Surgical History:  Procedure Laterality Date  . ABDOMINAL AORTOGRAM N/A 08/11/2016   Procedure: Abdominal Aortogram;  Surgeon: Waynetta Sandy, MD;  Location: [facility];  Service: Cardiovascular;  Laterality: N/A;  . ABDOMINAL AORTOGRAM W/LOWER EXTREMITY N/A 08/15/2016   Procedure: Abdominal Aortogram w/Lower Extremity;  Surgeon: Elam Dutch, MD;  Location: [facility];  Service: Cardiovascular;  Laterality: N/A;  . AMPUTATION Right 08/17/2016   Procedure: RIGHT BELOW KNEE AMPUTATION;  Surgeon: Elam Dutch, MD;  Location: [facility];  Service: Vascular;  Laterality: Right;  . AMPUTATION Right 09/12/2016   Procedure: AMPUTATION ABOVE KNEE;  Surgeon: Newt Minion, MD;  Location: [facility];  Service: Orthopedics;  Laterality: Right;  . AMPUTATION Left 08/12/2016   Procedure: LEFT BELOW KNEE AMPUTATION;  Surgeon: Newt Minion, MD;  Location: [facility];  Service: Orthopedics;  Laterality: Left;  . AMPUTATION Left 11/01/2017   Procedure: LEFT ABOVE KNEE AMPUTATION;  Surgeon: Newt Minion, MD;  Location: [facility];  Service: Orthopedics;  Laterality: Left;  . APPLICATION OF WOUND VAC Right  09/12/2016   Procedure: APPLICATION OF WOUND VAC ABOVE KNEE;  Surgeon: Newt Minion, MD;  Location: [facility];  Service: Orthopedics;  Laterality: Right;  . LOWER EXTREMITY ANGIOGRAPHY Bilateral 08/11/2016   Procedure: Lower Extremity Angiography;  Surgeon: Waynetta Sandy, MD;  Location: [facility];  Service: Cardiovascular;  Laterality: Bilateral;  . PERIPHERAL VASCULAR BALLOON ANGIOPLASTY Left 08/11/2016   Procedure: Peripheral Vascular Balloon Angioplasty;  Surgeon: Waynetta Sandy, MD;  Location: [facility];  Service: Cardiovascular;  Laterality: Left;  SFA  . THYROID SURGERY      Social History   Socioeconomic History  . Marital status: Single    Spouse name: Not on file  . Number of children: Not on file  . Years of education: Not on file  . Highest education level: Not on file  Occupational History  . Occupation: drives a Visual merchandiser  . Financial resource strain: Not on file  . Food insecurity    Worry: Not on file    Inability: Not on file  . Transportation needs    Medical: Not on file    Non-medical: Not on file  Tobacco  Use  . Smoking status: Current Every Day Smoker    Packs/day: 0.25    Years: 45.00    Pack years: 11.25    Types: Cigarettes    Last attempt to quit: 11/12/2014    Years since quitting: 4.0  . Smokeless tobacco: Never Used  Substance and Sexual Activity  . Alcohol use: Not Currently    Alcohol/week: 0.0 standard drinks  . Drug use: No  . Sexual activity: Not Currently  Lifestyle  . Physical activity    Days per week: Not on file    Minutes per session: Not on file  . Stress: Not on file  Relationships  . Social Herbalist on phone: Not on file    Gets together: Not on file    Attends religious service: Not on file    Active member of club or organization: Not on file    Attends meetings of clubs or organizations: Not on file    Relationship status: Not on file  Other Topics Concern  . Not on  file  Social History Narrative  . Not on file    Family History  Problem Relation Age of Onset  . Heart disease Mother   . Pneumonia Father   . Diabetes Maternal Aunt   . Diabetes Maternal Uncle     Outpatient Encounter Medications as of 12/10/2018  Medication Sig  . aspirin EC 81 MG tablet Take 81 mg by mouth daily.  [name] atorvastatin (LIPITOR) 40 MG tablet Take 1 tablet (40 mg total) by mouth at bedtime.  . blood glucose meter kit and supplies Dispense based on patient and insurance preference. Use up to four times daily as directed. (FOR ICD-10 E10.9, E11.9).  . fenofibrate (TRICOR) 145 MG tablet Take 1 tablet (145 mg total) by mouth daily.  . folic acid (FOLVITE) 1 MG tablet Take 1 tablet (1 mg total) by mouth daily.  [name] glucose blood (ACCU-CHEK AVIVA PLUS) test strip Check blood sugars four times a day  . Insulin Glargine (LANTUS SOLOSTAR) 100 UNIT/ML Solostar Pen Inject 60 Units into the skin at bedtime.  . Insulin Pen Needle 31G X 5 MM MISC 1 Device by Does not apply route as directed.  [name] levothyroxine (SYNTHROID) 100 MCG tablet Take 1 tablet (100 mcg total) by mouth daily before breakfast.  . metoprolol succinate (TOPROL-XL) 50 MG 24 hr tablet Take 1 tablet (50 mg total) by mouth daily. Take with or immediately following a meal.  . thiamine 100 MG tablet Take 1 tablet (100 mg total) by mouth daily.  . [DISCONTINUED] levothyroxine (SYNTHROID, LEVOTHROID) 75 MCG tablet Take 1 tablet (75 mcg total) by mouth daily before breakfast.   No facility-administered encounter medications on file as of 12/10/2018.     ALLERGIES: Allergies  Allergen Reactions  . Lisinopril Other (See Comments)    Hyperkalemia / Renal failure    VACCINATION STATUS: Immunization History  Administered Date(s) Administered  . Influenza,inj,Quad PF,6+ Mos 05/20/2015, 04/07/2017, 03/09/2018  . Influenza-Unspecified 05/10/2011  . PPD Test 09/16/2016    Diabetes He presents for his follow-up diabetic  visit. He has type 2 diabetes mellitus. Onset time: Patient was diagnosed with A1c of greater than 14% in 2017 at the age of 63 years. His disease course has been worsening. Pertinent negatives for hypoglycemia include no confusion, headaches, pallor or seizures. Associated symptoms include blurred vision, polydipsia and polyuria. Pertinent negatives for diabetes include no chest pain, no fatigue, no polyphagia and no weakness. There  are no hypoglycemic complications. Symptoms are worsening. Diabetic complications include nephropathy and PVD. 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 Current diabetic treatment includes insulin injections (He cannot commit to whether he is consistent on Lantus 60 units nightly.). He is compliant with treatment none of the time. His weight is decreasing steadily. He is following a generally unhealthy diet. When asked about meal planning, he reported none. He has not had a previous visit with a dietitian. He never participates in exercise. Home blood sugar record trend: He only tested 2 times the last 14 days tragically, however, he document blood glucose between 200-240 on his log 4 times a day . (He did not bring any logs nor meter.  His previsit labs show A1c of 14.3% increasing from 12.2%.   ) An ACE inhibitor/angiotensin II receptor blocker is not being taken.  Hyperlipidemia This is a chronic problem. The current episode started more than 1 year ago. The problem is uncontrolled. Exacerbating diseases include diabetes and hypothyroidism. Pertinent negatives include no chest pain, myalgias or shortness of breath. Current antihyperlipidemic treatment includes statins. Risk factors for coronary artery disease include diabetes mellitus, dyslipidemia and  a sedentary lifestyle.  Thyroid Problem Presents for initial visit. Onset time: He reports thyroidectomy in 2013 due to benign nodular goiter. Patient reports no constipation, diarrhea, fatigue or palpitations. Past treatments include levothyroxine. The treatment provided moderate relief. Prior procedures include thyroidectomy. His past medical history is significant for diabetes and hyperlipidemia.     Review of Systems  Constitutional: Negative for chills, fatigue, fever and unexpected weight change.  HENT: Negative for dental problem, mouth sores and trouble swallowing.   Eyes: Positive for blurred vision. Negative for visual disturbance.  Respiratory: Negative for cough, choking, chest tightness, shortness of breath and wheezing.   Cardiovascular: Negative for chest pain, palpitations and leg swelling.  Gastrointestinal: Negative for abdominal distention, abdominal pain, constipation, diarrhea, nausea and vomiting.  Endocrine: Positive for polydipsia and polyuria. Negative for polyphagia.  Genitourinary: Negative for dysuria, flank pain, hematuria and urgency.  Musculoskeletal: Positive for gait problem. Negative for back pain, myalgias and neck pain.       Wheelchair-bound due to bilateral above-knee amputation as a complication of diabetes and peripheral arterial disease.  Skin: Negative for pallor, rash and wound.  Neurological: Negative for seizures, syncope, weakness, numbness and headaches.  Psychiatric/Behavioral: Negative for confusion and dysphoric mood.    Objective:    BP (!) 153/88   Pulse 77   Wt Readings from Last 3 Encounters:  02/15/18 134 lb 14.7 oz (61.2 kg)  02/05/18 138 lb 14.2 oz (63 kg)  11/30/17 145 lb (65.8 kg)     Physical Exam Constitutional:      General: He is not in acute distress.    Appearance: He is well-developed.  HENT:     Head: Normocephalic and atraumatic.  Neck:     Musculoskeletal: Normal range of motion and neck supple.     Thyroid:  No thyromegaly.     Trachea: No tracheal deviation.  Cardiovascular:     Rate and Rhythm: Normal rate.     Pulses:          Dorsalis pedis pulses are 1+ on the right side and 1+ on the left side.       Posterior tibial pulses are 1+ on the  right side and 1+ on the left side.     Heart sounds: Normal heart sounds, S1 normal and S2 normal. No murmur. No gallop.   Pulmonary:     Effort: Pulmonary effort is normal.     Breath sounds: No wheezing or rales.  Abdominal:     General: There is no distension.     Tenderness: There is no abdominal tenderness. There is no guarding.  Musculoskeletal:     Right shoulder: He exhibits no swelling and no deformity.     Comments: And is wheelchair-bound due to bilateral above-knee amputation as a complication of diabetes and peripheral arterial disease.  Skin:    General: Skin is warm and dry.     Findings: No rash.     Nails: There is no clubbing.   Neurological:     Mental Status: He is alert and oriented to person, place, and time.     Cranial Nerves: No cranial nerve deficit.     Sensory: No sensory deficit.     Gait: Gait normal.     Deep Tendon Reflexes: Reflexes are normal and symmetric.  Psychiatric:        Speech: Speech normal.        Behavior: Behavior is cooperative.     Comments: Reluctant affect.     Diabetic Labs (most recent): Lab Results  Component Value Date   HGBA1C 14.3 (H) 11/14/2018   HGBA1C 12.3 (H) 07/17/2018   HGBA1C >14.0 (H) 02/02/2018     Lipid Panel ( most recent) Lipid Panel     Component Value Date/Time   CHOL 249 (H) 07/17/2018 0930   TRIG 556 (HH) 07/17/2018 0930   HDL 36 (L) 07/17/2018 0930   CHOLHDL 6.9 (H) 07/17/2018 0930   LDLCALC Comment 07/17/2018 0930     Recent Results (from the past 2160 hour(s))  T4, Free     Status: None   Collection Time: 11/14/18  9:48 AM  Result Value Ref Range   Free T4 1.17 0.82 - 1.77 ng/dL  Hemoglobin A1c     Status: Abnormal   Collection Time: 11/14/18   9:48 AM  Result Value Ref Range   Hgb A1c MFr Bld 14.3 (H) 4.8 - 5.6 %    Comment:          Prediabetes: 5.7 - 6.4          Diabetes: >6.4          Glycemic control for adults with diabetes: <7.0    Est. average glucose Bld gHb Est-mCnc 364 mg/dL  Comprehensive metabolic panel     Status: Abnormal   Collection Time: 11/14/18  9:48 AM  Result Value Ref Range   Glucose 291 (H) 65 - 99 mg/dL   BUN 8 8 - 27 mg/dL   Creatinine, Ser 1.18 0.76 - 1.27 mg/dL   GFR calc non Af Amer 65 >59 mL/min/1.73   GFR calc Af Amer 75 >59 mL/min/1.73   BUN/Creatinine Ratio 7 (L) 10 - 24   Sodium 140 134 - 144 mmol/L   Potassium 3.9 3.5 - 5.2 mmol/L   Chloride 97 96 - 106 mmol/L   CO2 21 20 - 29 mmol/L   Calcium 8.3 (L) 8.6 - 10.2 mg/dL   Total Protein 8.4 6.0 - 8.5 g/dL   Albumin 3.8 3.8 - 4.8 g/dL   Globulin, Total 4.6 (H) 1.5 - 4.5 g/dL   Albumin/Globulin Ratio 0.8 (L) 1.2 - 2.2   Bilirubin Total 0.5 0.0 -  1.2 mg/dL   Alkaline Phosphatase 151 (H) 39 - 117 IU/L   AST 42 (H) 0 - 40 IU/L   ALT 26 0 - 44 IU/L  TSH     Status: Abnormal   Collection Time: 11/14/18  9:48 AM  Result Value Ref Range   TSH 16.640 (H) 0.450 - 4.500 uIU/mL       Assessment & Plan:   1. Type 2 diabetes mellitus with stage 4 chronic kidney disease, with long-term current use of insulin ([facility])  - Rasheen Bells has currently uncontrolled symptomatic type 2 DM since 63 years of age. -Patient presents with fabricated blood glucose readings and his logs, his recent labs show A1c of 14.3%     He denies hypoglycemia.   -This patient has history of heavy alcohol use/abuse which might have contributed to the pathogenesis of his diabetes. -I had a long discussion with him about the progressive nature of type 2 diabetes and the pathology behind its complications.   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 These are all discussed in detail with him.  - I have counseled him on diet management by adopting a carbohydrate restricted/protein rich diet.  - he  admits there is a room for improvement in his diet and drink choices. -  Suggestion is made for him to avoid simple carbohydrates  from his diet including Cakes, Sweet Desserts / Pastries, Ice Cream, Soda (diet and regular), Sweet Tea, Candies, Chips, Cookies, Sweet Pastries,  Store Bought Juices, Alcohol in Excess of  1-2 drinks a day, Artificial Sweeteners, Coffee Creamer, and "Sugar-free" Products. This will help patient to have stable blood glucose profile and potentially avoid unintended weight gain.  - I encouraged him to switch to  unprocessed or minimally processed complex starch and increased protein intake (animal or plant source), fruits, and vegetables.  - he is advised to stick to a routine mealtimes to eat 3 meals  a day and avoid unnecessary snacks ( to snack only to correct hypoglycemia).   - he will be scheduled with Jearld Fenton, RDN, CDE for individualized diabetes education.  - I have approached him with the following individualized plan to manage diabetes and patient agrees:   -Given his current and prevailing glycemic burden, he may require intensive treatment with basal/bolus insulin in order for him to achieve control of diabetes to target.   However, tragically, patient remains uncommitted for proper use of insulin/monitoring, seems to have significant cognitive deficit, and reluctant to engage, he will benefit more from simplified treatment regimen.   -He presents with made up blood glucose readings in his log sheets.  -In preparation, for one last attempt, he is advised to continue Lantus 60 units nightly, associated with monitoring of blood glucose at least 2 times a day-daily before  breakfast and at bedtime.    - he is warned not to take insulin without proper monitoring per orders.  - he is encouraged to call clinic for blood glucose levels less than 70 or above 300 mg /dl. -He remains high risk to give multiple daily injections of insulin.    -In this patient with high likelihood of pancreatic diabetes, tight glycemic profile is not advised as he may be lacking endogenous glucagon response which puts him already high risk for hypoglycemia.  -  he is not a candidate for metformin, SGLT2 inhibitors due to concurrent renal insufficiency. -He is not a candidate for incretin therapy due to his background history of alcoholism with possible risk of pancreatitis.  He is not a suitable candidate for sulfonylurea medications either.  - Patient specific target  A1c;  LDL, HDL, Triglycerides, and  Waist Circumference were discussed in detail.  2) Blood Pressure /Hypertension: His blood pressure is not controlled to target.    he is advised to continue his current medications including metoprolol 50 mg p.o. daily with breakfast .  3) Lipids/Hyperlipidemia:   Review of his recent lipid panel showed uncontrolled hypertriglyceridemia at 556.  He is advised to continue atorvastatin 40 mg p.o. nightly, , discussed and added fenofibrate 145 mg p.o. daily at bedtime.   Side effects and precautions discussed with him.  4)  Weight/Diet:  There is no height or weight on file to calculate BMI.  -   CDE Consult will be initiated . Exercise, and detailed carbohydrates information provided  -  detailed on discharge instructions.  5) postsurgical hypothyroidism -He underwent total thyroidectomy in 2013 for what appears to be benign nodular goiter. -He is advised to continue levothyroxine to 100 mcg p.o. every morning.    - We discussed about the correct intake of his thyroid hormone, on empty stomach at fasting, with water, separated by at least 30 minutes from breakfast and other medications,   and separated by more than 4 hours from calcium, iron, multivitamins, acid reflux medications (PPIs). -Patient is made aware of the fact that thyroid hormone replacement is needed for life, dose to be adjusted by periodic monitoring of thyroid function tests.    6) Chronic Care/Health Maintenance:  -he  is on  statin medications and  is encouraged to initiate and continue to follow up with Ophthalmology, Dentist,  Podiatrist at least yearly or according to recommendations, and advised to stay away from smoking. I have recommended yearly flu vaccine and pneumonia vaccine at least every 5 years; moderate intensity exercise for up to 150 minutes weekly; and  sleep for at least 7 hours a day.  - he is  advised to maintain close follow up with [name], MD for primary care needs, as well as his other providers for optimal and coordinated care.  - Time spent with the patient: 25 min, of which >50% was spent in reviewing his blood glucose logs , discussing his hypoglycemia and hyperglycemia episodes, reviewing his current and  previous labs / studies and medications  doses and developing a plan to avoid hypoglycemia and hyperglycemia. Please refer to Patient Instructions for Blood Glucose Monitoring and Insulin/Medications Dosing Guide"  in media tab for additional information. Please  also refer to " Patient Self Inventory" in the Media  tab for reviewed elements of pertinent patient history.  [name] participated in the discussions, expressed understanding, and voiced agreement with the above plans.  All questions were answered to his satisfaction. he is encouraged to contact clinic should he have any questions or concerns prior to his return visit.   Follow up plan: - Return in about 3 months (around 03/12/2019) for Follow up with Pre-visit Labs, Meter, and Logs.  Glade Lloyd, MD [facility] [address] [place], [place]  [address] Phone: [phone]  Fax: [phone]    12/10/2018, 1:04 PM  This note was partially dictated with voice recognition software. Similar sounding words can be transcribed inadequately or may not  be corrected upon review.

## 2018-12-10 NOTE — Patient Instructions (Signed)

## 2018-12-14 NOTE — Telephone Encounter (Signed)
Patient seen since phone call.  This encounter will now be closed 

## 2019-01-01 ENCOUNTER — Telehealth: Payer: Self-pay | Admitting: Orthopedic Surgery

## 2019-01-01 NOTE — Telephone Encounter (Signed)
Pt daughter called in requesting a referral to physical therapy in [place].   Please give her a call   [phone]

## 2019-01-02 ENCOUNTER — Other Ambulatory Visit: Payer: Self-pay

## 2019-01-02 NOTE — Telephone Encounter (Signed)
I called and lm on vm to advise  There are several physical therapy offices in [place] that he can go to and just need to know what office they want to go to and we can write the order.

## 2019-01-10 ENCOUNTER — Telehealth: Payer: Self-pay

## 2019-01-10 NOTE — Telephone Encounter (Signed)
Received 4 boxes of Lantus pens from patient assistance. Left detailed message on patients answering machine to come by the office and pick them up

## 2019-01-11 ENCOUNTER — Telehealth: Payer: Self-pay | Admitting: Family Medicine

## 2019-01-11 NOTE — Telephone Encounter (Signed)
Mom aware insulin is here

## 2019-01-31 ENCOUNTER — Other Ambulatory Visit: Payer: Self-pay | Admitting: Family Medicine

## 2019-02-05 ENCOUNTER — Telehealth: Payer: Self-pay | Admitting: "Endocrinology

## 2019-02-05 MED ORDER — LEVOTHYROXINE SODIUM 100 MCG PO TABS: 100.0000 ug | ORAL_TABLET | Freq: Every day | ORAL | 0 refills | Status: DC

## 2019-02-05 NOTE — Telephone Encounter (Signed)
Patient needs refill on levothyroxine (SYNTHROID) 100 MCG tablet. He said he is out and they arent giving him 90 days supply like he asked.   [facility] [employer]

## 2019-02-05 NOTE — Telephone Encounter (Signed)
Rx sent 

## 2019-03-14 ENCOUNTER — Ambulatory Visit: Payer: Self-pay | Admitting: "Endocrinology

## 2019-03-26 ENCOUNTER — Other Ambulatory Visit: Payer: Self-pay

## 2019-03-26 ENCOUNTER — Other Ambulatory Visit: Payer: Medicare Other

## 2019-03-28 ENCOUNTER — Ambulatory Visit (INDEPENDENT_AMBULATORY_CARE_PROVIDER_SITE_OTHER): Payer: Medicare Other | Admitting: "Endocrinology

## 2019-03-28 ENCOUNTER — Encounter: Payer: Self-pay | Admitting: "Endocrinology

## 2019-03-28 ENCOUNTER — Other Ambulatory Visit: Payer: Self-pay

## 2019-03-28 DIAGNOSIS — Z794 Long term (current) use of insulin: Secondary | ICD-10-CM

## 2019-03-28 DIAGNOSIS — E1122 Type 2 diabetes mellitus with diabetic chronic kidney disease: Secondary | ICD-10-CM

## 2019-03-28 DIAGNOSIS — N184 Chronic kidney disease, stage 4 (severe): Secondary | ICD-10-CM

## 2019-03-28 DIAGNOSIS — E039 Hypothyroidism, unspecified: Secondary | ICD-10-CM

## 2019-03-28 DIAGNOSIS — I10 Essential (primary) hypertension: Secondary | ICD-10-CM

## 2019-03-28 DIAGNOSIS — E782 Mixed hyperlipidemia: Secondary | ICD-10-CM

## 2019-03-28 MED ORDER — LANTUS SOLOSTAR 100 UNIT/ML ~~LOC~~ SOPN: 65.0000 [IU] | PEN_INJECTOR | Freq: Every day | SUBCUTANEOUS | 5 refills | Status: DC

## 2019-03-28 NOTE — Progress Notes (Signed)
03/28/2019, 5:41 PM                                                    Endocrinology Telehealth Visit Follow up Note -During COVID -19 Pandemic  This visit type was conducted due to national recommendations for restrictions regarding the COVID-19 Pandemic  in an effort to limit this patient's exposure and mitigate transmission of the corona virus.  Due to his co-morbid illnesses, Todd [name] is at  moderate to high risk for complications without adequate follow up.  This format is felt to be most appropriate for him at this time.  I connected with this patient on 03/28/2019   by telephone and verified that I am speaking with the correct person using two identifiers. Todd [name], 1955/08/09. he has verbally consented to this visit. All issues noted in this document were discussed and addressed. The format was not optimal for physical exam.    Subjective:    Patient ID: Todd [name], male    DOB: [birth year].  Todd [name] is being engaged in telehealth via telephone  in follow-up for management of currently uncontrolled symptomatic diabetes requested by  [name], MD.   Past Medical History:  Diagnosis Date  . AKI (acute kidney injury) ([facility])   . Constipated   . Diabetes mellitus without complication ([place])   . Diarrhea   . Elevated LFTs   . Goiter   . Gout   . Hyperlipidemia   . Hypertension   . Leukocytosis   . Reactive thrombocytosis   . Right BKA infection ([place]) 08/2016  . Right leg pain   . Sepsis due to undetermined organism ([place])   . Thyroid disease   . Wound infection after surgery 08/2016    Past Surgical History:  Procedure Laterality Date  . ABDOMINAL AORTOGRAM N/A 08/11/2016   Procedure: Abdominal Aortogram;  Surgeon: Waynetta Sandy, MD;  Location: [facility];  Service: Cardiovascular;  Laterality: N/A;  . ABDOMINAL AORTOGRAM W/LOWER EXTREMITY N/A 08/15/2016   Procedure:  Abdominal Aortogram w/Lower Extremity;  Surgeon: Elam Dutch, MD;  Location: [facility];  Service: Cardiovascular;  Laterality: N/A;  . AMPUTATION Right 08/17/2016   Procedure: RIGHT BELOW KNEE AMPUTATION;  Surgeon: Elam Dutch, MD;  Location: [facility];  Service: Vascular;  Laterality: Right;  . AMPUTATION Right 09/12/2016   Procedure: AMPUTATION ABOVE KNEE;  Surgeon: Newt Minion, MD;  Location: [facility];  Service: Orthopedics;  Laterality: Right;  . AMPUTATION Left 08/12/2016   Procedure: LEFT BELOW KNEE AMPUTATION;  Surgeon: Newt Minion, MD;  Location: [facility];  Service: Orthopedics;  Laterality: Left;  . AMPUTATION Left 11/01/2017   Procedure: LEFT ABOVE KNEE AMPUTATION;  Surgeon: Newt Minion, MD;  Location: [facility];  Service: Orthopedics;  Laterality: Left;  . APPLICATION OF WOUND VAC Right 09/12/2016   Procedure: APPLICATION OF WOUND VAC ABOVE KNEE;  Surgeon: Newt Minion, MD;  Location: [facility];  Service: Orthopedics;  Laterality: Right;  . LOWER EXTREMITY ANGIOGRAPHY Bilateral 08/11/2016   Procedure: Lower Extremity Angiography;  Surgeon: Erlene Quan  Dione Plover, MD;  Location: [facility];  Service: Cardiovascular;  Laterality: Bilateral;  . PERIPHERAL VASCULAR BALLOON ANGIOPLASTY Left 08/11/2016   Procedure: Peripheral Vascular Balloon Angioplasty;  Surgeon: Waynetta Sandy, MD;  Location: [facility];  Service: Cardiovascular;  Laterality: Left;  SFA  . THYROID SURGERY      Social History   Socioeconomic History  . Marital status: Single    Spouse name: Not on file  . Number of children: Not on file  . Years of education: Not on file  . Highest education level: Not on file  Occupational History  . Occupation: drives a Visual merchandiser  . Financial resource strain: Not on file  . Food insecurity    Worry: Not on file    Inability: Not on file  . Transportation needs    Medical: Not on file    Non-medical: Not on file  Tobacco Use  .  Smoking status: Current Every Day Smoker    Packs/day: 0.25    Years: 45.00    Pack years: 11.25    Types: Cigarettes    Last attempt to quit: 11/12/2014    Years since quitting: 4.3  . Smokeless tobacco: Never Used  Substance and Sexual Activity  . Alcohol use: Not Currently    Alcohol/week: 0.0 standard drinks  . Drug use: No  . Sexual activity: Not Currently  Lifestyle  . Physical activity    Days per week: Not on file    Minutes per session: Not on file  . Stress: Not on file  Relationships  . Social Herbalist on phone: Not on file    Gets together: Not on file    Attends religious service: Not on file    Active member of club or organization: Not on file    Attends meetings of clubs or organizations: Not on file    Relationship status: Not on file  Other Topics Concern  . Not on file  Social History Narrative  . Not on file    Family History  Problem Relation Age of Onset  . Heart disease Mother   . Pneumonia Father   . Diabetes Maternal Aunt   . Diabetes Maternal Uncle     Outpatient Encounter Medications as of 03/28/2019  Medication Sig  . aspirin EC 81 MG tablet Take 81 mg by mouth daily.  [name] atorvastatin (LIPITOR) 40 MG tablet Take 1 tablet (40 mg total) by mouth at bedtime.  . blood glucose meter kit and supplies Dispense based on patient and insurance preference. Use up to four times daily as directed. (FOR ICD-10 E10.9, E11.9).  . fenofibrate (TRICOR) 145 MG tablet Take 1 tablet (145 mg total) by mouth daily.  . folic acid (FOLVITE) 1 MG tablet Take 1 tablet (1 mg total) by mouth daily.  [name] glucose blood (ACCU-CHEK AVIVA PLUS) test strip Check blood sugars four times a day  . Insulin Glargine (LANTUS SOLOSTAR) 100 UNIT/ML Solostar Pen Inject 65 Units into the skin at bedtime.  . Insulin Pen Needle 31G X 5 MM MISC 1 Device by Does not apply route as directed.  [name] levothyroxine (SYNTHROID) 100 MCG tablet Take 1 tablet (100 mcg total) by mouth daily  before breakfast.  . metoprolol succinate (TOPROL-XL) 50 MG 24 hr tablet TAKE 1 TABLET BY MOUTH ONCE DAILY. TAKE WITH OR IMMEDIATELY FOLLOWING A MEAL  . metoprolol succinate (TOPROL-XL) 50 MG 24 hr tablet TAKE 1 TABLET BY MOUTH  ONCE DAILY FOR BLOOD PRESSURE CONTROL AND KIDNEY PROTECTION  . thiamine 100 MG tablet Take 1 tablet (100 mg total) by mouth daily.  . [DISCONTINUED] Insulin Glargine (LANTUS SOLOSTAR) 100 UNIT/ML Solostar Pen Inject 60 Units into the skin at bedtime.   No facility-administered encounter medications on file as of 03/28/2019.     ALLERGIES: Allergies  Allergen Reactions  . Lisinopril Other (See Comments)    Hyperkalemia / Renal failure    VACCINATION STATUS: Immunization History  Administered Date(s) Administered  . Influenza,inj,Quad PF,6+ Mos 05/20/2015, 04/07/2017, 03/09/2018  . Influenza-Unspecified 05/10/2011  . PPD Test 09/16/2016    Diabetes He presents for his follow-up diabetic visit. He has type 2 diabetes mellitus. Onset time: Patient was diagnosed with A1c of greater than 14% in 2017 at the age of 40 years. His disease course has been improving. Pertinent negatives for hypoglycemia include no confusion, headaches, pallor or seizures. Associated symptoms include blurred vision. Pertinent negatives for diabetes include no chest pain, no fatigue, no polydipsia, no polyphagia, no polyuria and no weakness. There are no hypoglycemic complications. Symptoms are improving. Diabetic complications include nephropathy and PVD. 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 Current diabetic treatment includes insulin injections (He cannot commit to whether he is consistent on Lantus 60 units nightly.). He is compliant with treatment none of the  time. He is following a generally unhealthy diet. When asked about meal planning, he reported none. He has not had a previous visit with a dietitian. He never participates in exercise. Home blood sugar record trend: He only tested 2 times the last 14 days tragically, however, he document blood glucose between 200-240 on his log 4 times a day . (He reports fasting blood glucose ranging from 138-291, prelunch blood glucose readings range from 131-200, presupper readings range from 130-160.     ) An ACE inhibitor/angiotensin II receptor blocker is not being taken.  Hyperlipidemia This is a chronic problem. The current episode started more than 1 year ago. The problem is uncontrolled. Exacerbating diseases include diabetes and hypothyroidism. Pertinent negatives include no chest pain, myalgias or shortness of breath. Current antihyperlipidemic treatment includes statins. Risk factors for coronary artery disease include diabetes mellitus, dyslipidemia and a sedentary lifestyle.  Thyroid Problem Presents for initial visit. Onset time: He reports thyroidectomy in 2013 due to benign nodular goiter. Patient reports no constipation, diarrhea, fatigue or palpitations. Past treatments include levothyroxine. The treatment provided moderate relief. Prior procedures include thyroidectomy. His past medical history is significant for diabetes and hyperlipidemia.   Review of systems: Limited as above.  Objective:    There were no vitals taken for this visit.  Wt Readings from Last 3 Encounters:  02/15/18 134 lb 14.7 oz (61.2 kg)  02/05/18 138 lb 14.2 oz (63 kg)  11/30/17 145 lb (65.8 kg)      Diabetic Labs (most recent): Lab Results  Component Value Date   HGBA1C 14.3 (H) 11/14/2018   HGBA1C 12.3 (H) 07/17/2018   HGBA1C >14.0 (H) 02/02/2018     Lipid Panel ( most recent) Lipid Panel     Component Value Date/Time   CHOL 249 (H) 07/17/2018 0930   TRIG 556 (HH) 07/17/2018 0930   HDL 36 (L) 07/17/2018  0930   CHOLHDL 6.9 (H) 07/17/2018 0930   LDLCALC Comment 07/17/2018 0930     No  results found for this or any previous visit (from the past 2160 hour(s)).     Assessment & Plan:   1. Type 2 diabetes mellitus with stage 4 chronic kidney disease, with long-term current use of insulin (HCC)  - Todd [name] has currently uncontrolled symptomatic type 2 DM since 63 years of age. -Patient is not a reliable historian previously presented with suspicious blood glucose logs.  His most recent A1c was extremely high at 14.3%.   -He reports improved glycemic profile for the last 10 days as above.     He denies hypoglycemia.   -This patient has history of heavy alcohol use/abuse which might have contributed to the pathogenesis of his diabetes. -I had a long discussion with him about the progressive nature of type 2 diabetes and the pathology behind its complications.   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 These are all discussed in detail with him.  - I have counseled him on diet management by adopting a carbohydrate restricted/protein rich diet. - he  admits there is a room for improvement in his diet and drink choices. -  Suggestion is made for him to avoid simple carbohydrates  from his diet including Cakes, Sweet Desserts / Pastries, Ice Cream, Soda (diet and regular), Sweet Tea, Candies, Chips, Cookies, Sweet Pastries,  Store Bought Juices, Alcohol in Excess of  1-2 drinks a day, Artificial Sweeteners, Coffee Creamer, and "Sugar-free" Products. This will help patient to have stable blood glucose profile and potentially avoid unintended weight gain.   - I encouraged him to switch to  unprocessed or minimally processed complex starch and  increased protein intake (animal or plant source), fruits, and vegetables.  - he is advised to stick to a routine mealtimes to eat 3 meals  a day and avoid unnecessary snacks ( to snack only to correct hypoglycemia).   - he will be scheduled with Todd [name], Todd [name], Todd [name] for individualized diabetes education.  - I have approached him with the following individualized plan to manage diabetes and patient agrees:   -Given his current and prevailing glycemic burden, he may require intensive treatment with basal/bolus insulin in order for him to achieve control of diabetes to target.   However, patient is not reliable for proper monitoring of blood glucose for safe use of insulin basal/bolus.  -His insulin doses will be adjusted slowly when he is being urged to engage better for proper monitoring of blood glucose  For safe use of insulin.  -In preparation, he is advised to increase his Lantus slightly to 65 units nightly, associated with monitoring of blood glucose at least 2 times a day-daily before breakfast and at bedtime.    - he is warned not to take insulin without proper monitoring per orders.  - he is encouraged to call clinic for blood glucose levels less than 70 or above 300 mg /dl. -He remains  Too high risk to give multiple daily injections of insulin.    -In this patient with high likelihood of pancreatic diabetes, tight glycemic profile is not advised as he may be lacking endogenous glucagon response which puts him at high risk for hypoglycemia.  - he is not a candidate for metformin, SGLT2 inhibitors due to concurrent renal insufficiency. -He is not a candidate for incretin therapy due to  his background history of alcoholism with possible risk of pancreatitis.  He is not a suitable candidate for sulfonylurea medications either.  - Patient specific target  A1c;  LDL, HDL, Triglycerides, and  Waist Circumference were discussed in detail.  2) Blood Pressure /Hypertension: he is  advised to home monitor blood pressure and report if > 140/90 on 2 separate readings.    he is advised to continue his current medications including metoprolol 50 mg p.o. daily with breakfast .  3) Lipids/Hyperlipidemia:   Review of his recent lipid panel showed uncontrolled hypertriglyceridemia at 556.  He is advised to continue atorvastatin 40 mg p.o. nightly, advised to continue fenofibrate 145 mg p.o. daily at bedtime.  Side effects and precautions discussed with him.  4)  Weight/Diet:  There is no height or weight on file to calculate BMI.  -   Todd [name] Consult has been initiated . Exercise, and detailed carbohydrates information provided  -  detailed on discharge instructions.  5) postsurgical hypothyroidism -He underwent total thyroidectomy in 2013 for what appears to be benign nodular goiter. -He is advised to continue levothyroxine to 100 mcg p.o. every morning.    - We discussed about the correct intake of his thyroid hormone, on empty stomach at fasting, with water, separated by at least 30 minutes from breakfast and other medications,  and separated by more than 4 hours from calcium, iron, multivitamins, acid reflux medications (PPIs). -Patient is made aware of the fact that thyroid hormone replacement is needed for life, dose to be adjusted by periodic monitoring of thyroid function tests.   6) Chronic Care/Health Maintenance:  -he  is on  statin medications and  is encouraged to initiate and continue to follow up with Ophthalmology, Dentist,  Podiatrist at least yearly or according to recommendations, and advised to stay away from smoking. I have recommended yearly flu vaccine and pneumonia vaccine at least every 5 years; moderate intensity exercise for up to 150 minutes weekly; and  sleep for at least 7 hours a day.  Patient does not have adequate social support, he has transportation concerns.  He may benefit from home assessment by Education officer, museum.  - he is  advised to maintain close  follow up with [name], MD for primary care needs, as well as his other providers for optimal and coordinated care.  - Patient Care Time Today:  25 min, of which >50% was spent in  counseling and the rest reviewing his  current and  previous labs/studies, previous treatments, his blood glucose readings, and medications' doses and developing a plan for long-term care based on the latest recommendations for standards of care.   Todd [name] participated in the discussions, expressed understanding, and voiced agreement with the above plans.  All questions were answered to his satisfaction. he is encouraged to contact clinic should he have any questions or concerns prior to his return visit.    Follow up plan: - Return in about 9 weeks (around 05/30/2019), or logs-8, for Meter, and Logs, Bring Meter and Logs- A1c in Office, Follow up with Pre-visit Labs.  Glade Lloyd, MD [facility] [address] [place], [place] [address] Phone: [phone]  Fax: [phone]    03/28/2019, 5:41 PM  This note was partially dictated with voice recognition software. Similar sounding words can be transcribed inadequately or may not  be corrected upon review.

## 2019-03-29 ENCOUNTER — Telehealth: Payer: Self-pay | Admitting: Family Medicine

## 2019-03-29 LAB — COMPREHENSIVE METABOLIC PANEL
ALT: 44 IU/L (ref 0–44)
AST: 93 IU/L — ABNORMAL HIGH (ref 0–40)
Albumin/Globulin Ratio: 0.8 — ABNORMAL LOW (ref 1.2–2.2)
Albumin: 3.6 g/dL — ABNORMAL LOW (ref 3.8–4.8)
Alkaline Phosphatase: 143 IU/L — ABNORMAL HIGH (ref 39–117)
BUN/Creatinine Ratio: 9 — ABNORMAL LOW (ref 10–24)
BUN: 11 mg/dL (ref 8–27)
Bilirubin Total: 0.6 mg/dL (ref 0.0–1.2)
CO2: 22 mmol/L (ref 20–29)
Calcium: 8.4 mg/dL — ABNORMAL LOW (ref 8.6–10.2)
Chloride: 102 mmol/L (ref 96–106)
Creatinine, Ser: 1.17 mg/dL (ref 0.76–1.27)
GFR calc Af Amer: 76 mL/min/{1.73_m2} (ref >59)
GFR calc non Af Amer: 66 mL/min/{1.73_m2} (ref >59)
Globulin, Total: 4.8 g/dL — ABNORMAL HIGH (ref 1.5–4.5)
Glucose: 157 mg/dL — ABNORMAL HIGH (ref 65–99)
Potassium: 3.6 mmol/L (ref 3.5–5.2)
Sodium: 140 mmol/L (ref 134–144)
Total Protein: 8.4 g/dL (ref 6.0–8.5)

## 2019-03-29 LAB — HGB A1C W/O EAG: Hgb A1c MFr Bld: 11.3 % — ABNORMAL HIGH (ref 4.8–5.6)

## 2019-03-29 NOTE — Telephone Encounter (Signed)
Called and spoke to the patient's sister [name] and informed her that per lab - labs were sent to [facility] and test were performed and results that we receive due to [facility] not having the correct account for Dr. Liliane Channel office have been received and faxed to Dr. Dorris Fetch.  Informed her that she will need to follow up with their office for results as they are the ordering provider. MPulliam, CMA/RT(R)

## 2019-04-01 LAB — HM DIABETES EYE EXAM

## 2019-04-04 ENCOUNTER — Other Ambulatory Visit: Payer: Self-pay | Admitting: "Endocrinology

## 2019-04-04 ENCOUNTER — Other Ambulatory Visit: Payer: Self-pay

## 2019-04-04 ENCOUNTER — Other Ambulatory Visit: Payer: Medicare Other

## 2019-04-04 DIAGNOSIS — E1122 Type 2 diabetes mellitus with diabetic chronic kidney disease: Secondary | ICD-10-CM

## 2019-04-04 DIAGNOSIS — Z794 Long term (current) use of insulin: Secondary | ICD-10-CM

## 2019-04-05 ENCOUNTER — Other Ambulatory Visit: Payer: Self-pay | Admitting: "Endocrinology

## 2019-04-05 LAB — COMPREHENSIVE METABOLIC PANEL
ALT: 25 IU/L (ref 0–44)
AST: 42 IU/L — ABNORMAL HIGH (ref 0–40)
Albumin/Globulin Ratio: 0.8 — ABNORMAL LOW (ref 1.2–2.2)
Albumin: 3.6 g/dL — ABNORMAL LOW (ref 3.8–4.8)
Alkaline Phosphatase: 126 IU/L — ABNORMAL HIGH (ref 39–117)
BUN/Creatinine Ratio: 9 — ABNORMAL LOW (ref 10–24)
BUN: 11 mg/dL (ref 8–27)
Bilirubin Total: 1 mg/dL (ref 0.0–1.2)
CO2: 21 mmol/L (ref 20–29)
Calcium: 7.8 mg/dL — ABNORMAL LOW (ref 8.6–10.2)
Chloride: 98 mmol/L (ref 96–106)
Creatinine, Ser: 1.29 mg/dL — ABNORMAL HIGH (ref 0.76–1.27)
GFR calc Af Amer: 68 mL/min/{1.73_m2} (ref >59)
GFR calc non Af Amer: 59 mL/min/{1.73_m2} — ABNORMAL LOW (ref >59)
Globulin, Total: 4.8 g/dL — ABNORMAL HIGH (ref 1.5–4.5)
Glucose: 134 mg/dL — ABNORMAL HIGH (ref 65–99)
Potassium: 3.5 mmol/L (ref 3.5–5.2)
Sodium: 137 mmol/L (ref 134–144)
Total Protein: 8.4 g/dL (ref 6.0–8.5)

## 2019-04-05 LAB — T4, FREE: Free T4: 1.3 ng/dL (ref 0.82–1.77)

## 2019-04-05 LAB — MICROALBUMIN / CREATININE URINE RATIO

## 2019-04-05 LAB — LIPID PANEL
Chol/HDL Ratio: 3.3 ratio (ref 0.0–5.0)
Cholesterol, Total: 117 mg/dL (ref 100–199)
HDL: 35 mg/dL — ABNORMAL LOW (ref >39)
LDL Chol Calc (NIH): 56 mg/dL (ref 0–99)
Triglycerides: 150 mg/dL — ABNORMAL HIGH (ref 0–149)
VLDL Cholesterol Cal: 26 mg/dL (ref 5–40)

## 2019-04-05 LAB — VITAMIN D 25 HYDROXY (VIT D DEFICIENCY, FRACTURES): Vit D, 25-Hydroxy: 6 ng/mL — ABNORMAL LOW (ref 30.0–100.0)

## 2019-04-05 LAB — TSH: TSH: 15.4 u[IU]/mL — ABNORMAL HIGH (ref 0.450–4.500)

## 2019-04-05 MED ORDER — LEVOTHYROXINE SODIUM 125 MCG PO TABS: 125.0000 ug | ORAL_TABLET | Freq: Every day | ORAL | 3 refills | Status: DC

## 2019-04-05 MED ORDER — VITAMIN D (ERGOCALCIFEROL) 1.25 MG (50000 UNIT) PO CAPS: 50000.0000 [IU] | ORAL_CAPSULE | ORAL | 0 refills | Status: DC

## 2019-04-19 ENCOUNTER — Telehealth: Payer: Self-pay | Admitting: Family Medicine

## 2019-04-22 NOTE — Telephone Encounter (Signed)
APPT MADE

## 2019-04-25 ENCOUNTER — Ambulatory Visit: Payer: Medicare Other | Admitting: Family Medicine

## 2019-05-01 ENCOUNTER — Encounter: Payer: Self-pay | Admitting: Family Medicine

## 2019-05-01 ENCOUNTER — Other Ambulatory Visit: Payer: Self-pay

## 2019-05-01 ENCOUNTER — Ambulatory Visit (INDEPENDENT_AMBULATORY_CARE_PROVIDER_SITE_OTHER): Payer: Medicare Other | Admitting: Family Medicine

## 2019-05-01 VITALS — BP 163/82 | HR 63 | Temp 97.0°F | Resp 20

## 2019-05-01 DIAGNOSIS — I10 Essential (primary) hypertension: Secondary | ICD-10-CM

## 2019-05-01 DIAGNOSIS — Z89611 Acquired absence of right leg above knee: Secondary | ICD-10-CM

## 2019-05-01 DIAGNOSIS — N184 Chronic kidney disease, stage 4 (severe): Secondary | ICD-10-CM

## 2019-05-01 DIAGNOSIS — E1122 Type 2 diabetes mellitus with diabetic chronic kidney disease: Secondary | ICD-10-CM | POA: Diagnosis not present

## 2019-05-01 DIAGNOSIS — Z89612 Acquired absence of left leg above knee: Secondary | ICD-10-CM

## 2019-05-01 DIAGNOSIS — Z23 Encounter for immunization: Secondary | ICD-10-CM | POA: Diagnosis not present

## 2019-05-01 DIAGNOSIS — Z794 Long term (current) use of insulin: Secondary | ICD-10-CM

## 2019-05-01 DIAGNOSIS — R6889 Other general symptoms and signs: Secondary | ICD-10-CM

## 2019-05-01 MED ORDER — METOPROLOL SUCCINATE ER 100 MG PO TB24: 100.0000 mg | ORAL_TABLET | Freq: Every day | ORAL | 1 refills | Status: DC

## 2019-05-01 NOTE — Progress Notes (Signed)
Subjective:  Patient ID: Nancy Manuele, male    DOB: [birth year]  Age: 63 y.o. MRN: [id number]  CC: Wants rx for new wheelchair and air mattress   HPI Brodyn Depuy presents for air mattress and wheel chair is a diabetic with bilateral AKA due to diabetic circulatory compromise. He is wheelchair bound. He . He uses assistance for ADL's such as toileting, grooming, bathing. He has difficulty changing position in bed due to the AKA's. He is at high risk for decubiti as a result and needs an airbed to prevent them. Additionally, he is able to maneuver in a wheelchair, but cannot use a walker as he is unable to stand after attempting to train with prosthetic legs.  Depression screen [facility] 2/9 05/01/2019 10/02/2018 07/17/2018  Decreased Interest 0 0 0  Down, Depressed, Hopeless 0 0 0  PHQ - 2 Score 0 0 0    History Nazar has a past medical history of AKI (acute kidney injury) ([place]), Constipated, Diabetes mellitus without complication ([place]), Diarrhea, Elevated LFTs, Goiter, Gout, Hyperlipidemia, Hypertension, Leukocytosis, Reactive thrombocytosis, Right BKA infection ([place]) (08/2016), Right leg pain, Sepsis due to undetermined organism [facility]), Thyroid disease, and Wound infection after surgery (08/2016).   He has a past surgical history that includes Thyroid surgery; Lower Extremity Angiography (Bilateral, 08/11/2016); ABDOMINAL AORTOGRAM (N/A, 08/11/2016); PERIPHERAL VASCULAR BALLOON ANGIOPLASTY (Left, 08/11/2016); ABDOMINAL AORTOGRAM W/LOWER EXTREMITY (N/A, 08/15/2016); Amputation (Right, 08/17/2016); Amputation (Right, 09/12/2016); Application if wound vac (Right, 09/12/2016); Amputation (Left, 08/12/2016); and Amputation (Left, 11/01/2017).   His family history includes Diabetes in his maternal aunt and maternal uncle; Heart disease in his mother; Pneumonia in his father.He reports that he has been smoking cigarettes. He has a 11.25 pack-year smoking history. He has never used smokeless tobacco. He reports  previous alcohol use. He reports that he does not use drugs.    ROS Review of Systems  Constitutional: Negative.   HENT: Negative.   Eyes: Negative for visual disturbance.  Respiratory: Negative for cough and shortness of breath.   Cardiovascular: Negative for chest pain and leg swelling.  Gastrointestinal: Negative for abdominal pain, diarrhea, nausea and vomiting.  Genitourinary: Negative for difficulty urinating.  Musculoskeletal: Positive for arthralgias, back pain and myalgias.  Skin: Negative for rash.  Neurological: Negative for headaches.  Psychiatric/Behavioral: Negative for sleep disturbance.    Objective:  BP (!) 163/82   Pulse 63   Temp (!) 97 F (36.1 C) (Temporal)   Resp 20   SpO2 100%   BP Readings from Last 3 Encounters:  05/01/19 (!) 163/82  12/10/18 (!) 153/88  11/27/18 (!) 173/97    Wt Readings from Last 3 Encounters:  02/15/18 134 lb 14.7 oz (61.2 kg)  02/05/18 138 lb 14.2 oz (63 kg)  11/30/17 145 lb (65.8 kg)     Physical Exam Constitutional:      General: He is not in acute distress.    Appearance: He is well-developed.  HENT:     Head: Normocephalic and atraumatic.     Right Ear: External ear normal.     Left Ear: External ear normal.     Nose: Nose normal.  Eyes:     Conjunctiva/sclera: Conjunctivae normal.     Pupils: Pupils are equal, round, and reactive to light.  Neck:     Musculoskeletal: Normal range of motion and neck supple.  Cardiovascular:     Rate and Rhythm: Normal rate and regular rhythm.     Heart sounds: Normal heart sounds. No murmur.  Pulmonary:  Effort: Pulmonary effort is normal. No respiratory distress.     Breath sounds: Normal breath sounds. No wheezing or rales.  Abdominal:     Palpations: Abdomen is soft.     Tenderness: There is no abdominal tenderness.  Musculoskeletal:     Comments: Bilateral AKA  Skin:    General: Skin is warm and dry.  Neurological:     Mental Status: He is alert and oriented  to person, place, and time.     Deep Tendon Reflexes: Reflexes are normal and symmetric.  Psychiatric:        Behavior: Behavior normal.        Thought Content: Thought content normal.        Judgment: Judgment normal.       Assessment & Plan:   Isley was seen today for wants rx for new wheelchair and air mattress.  Diagnoses and all orders for this visit:  Difficulty maintaining body in lying position -     For home use only DME Other see comment  Need for immunization against influenza -     Flu Vaccine QUAD 36+ mos IM  Essential hypertension, benign  Type 2 diabetes mellitus with stage 4 chronic kidney disease, with long-term current use of insulin (HCC)  S/P AKA (above knee amputation) bilateral (HCC)  Other orders -     metoprolol succinate (TOPROL-XL) 100 MG 24 hr tablet; Take 1 tablet (100 mg total) by mouth daily. Take with or immediately following a meal.       I have changed Dewel Klemens's metoprolol succinate. I am also having him maintain his Insulin Pen Needle, blood glucose meter kit and supplies, folic acid, thiamine, glucose blood, aspirin EC, atorvastatin, fenofibrate, Lantus SoloStar, levothyroxine, and Vitamin D (Ergocalciferol).  Allergies as of 05/01/2019      Reactions   Lisinopril Other (See Comments)   Hyperkalemia / Renal failure      Medication List       Accurate as of May 01, 2019  3:09 PM. If you have any questions, ask your nurse or doctor.        aspirin EC 81 MG tablet Take 81 mg by mouth daily.   atorvastatin 40 MG tablet Commonly known as: LIPITOR Take 1 tablet (40 mg total) by mouth at bedtime.   blood glucose meter kit and supplies Dispense based on patient and insurance preference. Use up to four times daily as directed. (FOR ICD-10 E10.9, E11.9).   fenofibrate 145 MG tablet Commonly known as: TRICOR Take 1 tablet (145 mg total) by mouth daily.   folic acid 1 MG tablet Commonly known as: FOLVITE Take 1  tablet (1 mg total) by mouth daily.   glucose blood test strip Commonly known as: Accu-Chek Aviva Plus Check blood sugars four times a day   Insulin Pen Needle 31G X 5 MM Misc 1 Device by Does not apply route as directed.   Lantus SoloStar 100 UNIT/ML Solostar Pen Generic drug: Insulin Glargine Inject 65 Units into the skin at bedtime.   levothyroxine 125 MCG tablet Commonly known as: SYNTHROID Take 1 tablet (125 mcg total) by mouth daily before breakfast.   metoprolol succinate 100 MG 24 hr tablet Commonly known as: TOPROL-XL Take 1 tablet (100 mg total) by mouth daily. Take with or immediately following a meal. What changed:   medication strength  See the new instructions. Changed by: [name], MD   thiamine 100 MG tablet Take 1 tablet (100 mg total) by mouth  daily.   Vitamin D (Ergocalciferol) 1.25 MG (50000 UT) Caps capsule Commonly known as: DRISDOL Take 1 capsule (50,000 Units total) by mouth every 7 (seven) days.            Durable Medical Equipment  (From admission, onward)         Start     Ordered   05/01/19 0000  For home use only DME Other see comment    Comments: Manual, lite weight wheel chair Air mattress with motorized compressor DX: Z89.611, Z 89.612, R68.89  Question:  Length of Need  Answer:  Lifetime   05/01/19 1055           Follow-up: Return in about 6 months (around 10/29/2019).  [name], M.D.

## 2019-05-30 ENCOUNTER — Ambulatory Visit: Payer: Medicare Other | Admitting: "Endocrinology

## 2019-06-16 ENCOUNTER — Other Ambulatory Visit: Payer: Self-pay | Admitting: Family Medicine

## 2019-06-17 ENCOUNTER — Ambulatory Visit (INDEPENDENT_AMBULATORY_CARE_PROVIDER_SITE_OTHER): Payer: Medicare HMO | Admitting: "Endocrinology

## 2019-06-17 ENCOUNTER — Telehealth: Payer: Self-pay | Admitting: "Endocrinology

## 2019-06-17 ENCOUNTER — Other Ambulatory Visit: Payer: Self-pay

## 2019-06-17 ENCOUNTER — Encounter: Payer: Self-pay | Admitting: "Endocrinology

## 2019-06-17 VITALS — BP 176/90 | HR 63

## 2019-06-17 DIAGNOSIS — I10 Essential (primary) hypertension: Secondary | ICD-10-CM | POA: Diagnosis not present

## 2019-06-17 DIAGNOSIS — E782 Mixed hyperlipidemia: Secondary | ICD-10-CM

## 2019-06-17 DIAGNOSIS — Z794 Long term (current) use of insulin: Secondary | ICD-10-CM | POA: Diagnosis not present

## 2019-06-17 DIAGNOSIS — E039 Hypothyroidism, unspecified: Secondary | ICD-10-CM | POA: Diagnosis not present

## 2019-06-17 DIAGNOSIS — F17200 Nicotine dependence, unspecified, uncomplicated: Secondary | ICD-10-CM | POA: Diagnosis not present

## 2019-06-17 DIAGNOSIS — E1122 Type 2 diabetes mellitus with diabetic chronic kidney disease: Secondary | ICD-10-CM

## 2019-06-17 DIAGNOSIS — N184 Chronic kidney disease, stage 4 (severe): Secondary | ICD-10-CM

## 2019-06-17 DIAGNOSIS — E559 Vitamin D deficiency, unspecified: Secondary | ICD-10-CM | POA: Insufficient documentation

## 2019-06-17 LAB — POCT GLYCOSYLATED HEMOGLOBIN (HGB A1C): Hemoglobin A1C: 7.1 % — AB (ref 4.0–5.6)

## 2019-06-17 MED ORDER — VITAMIN D3 125 MCG (5000 UT) PO CAPS: 5000.0000 [IU] | ORAL_CAPSULE | Freq: Every day | ORAL | 0 refills | Status: DC

## 2019-06-17 MED ORDER — LANTUS SOLOSTAR 100 UNIT/ML ~~LOC~~ SOPN: 50.0000 [IU] | PEN_INJECTOR | Freq: Every day | SUBCUTANEOUS | 5 refills | Status: DC

## 2019-06-17 NOTE — Telephone Encounter (Signed)
Pt's sister called and wants you to call her, she has some questions for you. [name] is her name [phone]

## 2019-06-17 NOTE — Telephone Encounter (Signed)
Pt

## 2019-06-17 NOTE — Progress Notes (Signed)
06/17/2019, 5:02 PM            Subjective:    Patient ID: Todd [name], male    DOB: [birth year].  Priest Lockridge is here to follow-up for management of currently uncontrolled symptomatic diabetes requested by  [name], MD.   Past Medical History:  Diagnosis Date  . AKI (acute kidney injury) ([place])   . Constipated   . Diabetes mellitus without complication ([place])   . Diarrhea   . Elevated LFTs   . Goiter   . Gout   . Hyperlipidemia   . Hypertension   . Leukocytosis   . Reactive thrombocytosis   . Right BKA infection ([place]) 08/2016  . Right leg pain   . Sepsis due to undetermined organism ([place])   . Thyroid disease   . Wound infection after surgery 08/2016    Past Surgical History:  Procedure Laterality Date  . ABDOMINAL AORTOGRAM N/A 08/11/2016   Procedure: Abdominal Aortogram;  Surgeon: Waynetta Sandy, MD;  Location: [facility];  Service: Cardiovascular;  Laterality: N/A;  . ABDOMINAL AORTOGRAM W/LOWER EXTREMITY N/A 08/15/2016   Procedure: Abdominal Aortogram w/Lower Extremity;  Surgeon: Elam Dutch, MD;  Location: [facility];  Service: Cardiovascular;  Laterality: N/A;  . AMPUTATION Right 08/17/2016   Procedure: RIGHT BELOW KNEE AMPUTATION;  Surgeon: Elam Dutch, MD;  Location: [facility];  Service: Vascular;  Laterality: Right;  . AMPUTATION Right 09/12/2016   Procedure: AMPUTATION ABOVE KNEE;  Surgeon: Newt Minion, MD;  Location: [facility];  Service: Orthopedics;  Laterality: Right;  . AMPUTATION Left 08/12/2016   Procedure: LEFT BELOW KNEE AMPUTATION;  Surgeon: Newt Minion, MD;  Location: [facility];  Service: Orthopedics;  Laterality: Left;  . AMPUTATION Left 11/01/2017   Procedure: LEFT ABOVE KNEE AMPUTATION;  Surgeon: Newt Minion, MD;  Location: [facility];  Service: Orthopedics;  Laterality: Left;  . APPLICATION OF WOUND VAC Right 09/12/2016   Procedure: APPLICATION OF WOUND VAC  ABOVE KNEE;  Surgeon: Newt Minion, MD;  Location: [facility];  Service: Orthopedics;  Laterality: Right;  . LOWER EXTREMITY ANGIOGRAPHY Bilateral 08/11/2016   Procedure: Lower Extremity Angiography;  Surgeon: Waynetta Sandy, MD;  Location: [facility];  Service: Cardiovascular;  Laterality: Bilateral;  . PERIPHERAL VASCULAR BALLOON ANGIOPLASTY Left 08/11/2016   Procedure: Peripheral Vascular Balloon Angioplasty;  Surgeon: Waynetta Sandy, MD;  Location: [facility];  Service: Cardiovascular;  Laterality: Left;  SFA  . THYROID SURGERY      Social History   Socioeconomic History  . Marital status: Single    Spouse name: Not on file  . Number of children: Not on file  . Years of education: Not on file  . Highest education level: Not on file  Occupational History  . Occupation: drives a Forensic scientist  Tobacco Use  . Smoking status: Current Every Day Smoker    Packs/day: 0.25    Years: 45.00    Pack years: 11.25    Types: Cigarettes    Last attempt to quit: 11/12/2014    Years since quitting: 4.5  . Smokeless tobacco: Never Used  Substance and Sexual Activity  . Alcohol use: Not Currently    Alcohol/week:  0.0 standard drinks  . Drug use: No  . Sexual activity: Not Currently  Other Topics Concern  . Not on file  Social History Narrative  . Not on file   Social Determinants of Health   Financial Resource Strain:   . Difficulty of Paying Living Expenses: Not on file  Food Insecurity:   . Worried About Charity fundraiser in the Last Year: Not on file  . Ran Out of Food in the Last Year: Not on file  Transportation Needs:   . Lack of Transportation (Medical): Not on file  . Lack of Transportation (Non-Medical): Not on file  Physical Activity:   . Days of Exercise per Week: Not on file  . Minutes of Exercise per Session: Not on file  Stress:   . Feeling of Stress : Not on file  Social Connections:   . Frequency of Communication with Friends and Family:  Not on file  . Frequency of Social Gatherings with Friends and Family: Not on file  . Attends Religious Services: Not on file  . Active Member of Clubs or Organizations: Not on file  . Attends Archivist Meetings: Not on file  . Marital Status: Not on file    Family History  Problem Relation Age of Onset  . Heart disease Mother   . Pneumonia Father   . Diabetes Maternal Aunt   . Diabetes Maternal Uncle     Outpatient Encounter Medications as of 06/17/2019  Medication Sig  . aspirin EC 81 MG tablet Take 81 mg by mouth daily.  [name] atorvastatin (LIPITOR) 40 MG tablet Take 1 tablet (40 mg total) by mouth at bedtime.  . blood glucose meter kit and supplies Dispense based on patient and insurance preference. Use up to four times daily as directed. (FOR ICD-10 E10.9, E11.9).  [name] Cholecalciferol (VITAMIN D3) 125 MCG (5000 UT) CAPS Take 1 capsule (5,000 Units total) by mouth daily.  . fenofibrate (TRICOR) 145 MG tablet Take 1 tablet (145 mg total) by mouth daily.  . folic acid (FOLVITE) 1 MG tablet Take 1 tablet (1 mg total) by mouth daily.  [name] glucose blood (ACCU-CHEK AVIVA PLUS) test strip Check blood sugars four times a day  . Insulin Glargine (LANTUS SOLOSTAR) 100 UNIT/ML Solostar Pen Inject 50 Units into the skin at bedtime.  . Insulin Pen Needle 31G X 5 MM MISC 1 Device by Does not apply route as directed.  [name] levothyroxine (SYNTHROID) 125 MCG tablet Take 1 tablet (125 mcg total) by mouth daily before breakfast.  . metoprolol succinate (TOPROL-XL) 100 MG 24 hr tablet Take 1 tablet (100 mg total) by mouth daily. Take with or immediately following a meal.  . thiamine 100 MG tablet Take 1 tablet (100 mg total) by mouth daily.  . Vitamin D, Ergocalciferol, (DRISDOL) 1.25 MG (50000 UT) CAPS capsule Take 1 capsule (50,000 Units total) by mouth every 7 (seven) days.  . [DISCONTINUED] Insulin Glargine (LANTUS SOLOSTAR) 100 UNIT/ML Solostar Pen Inject 65 Units into the skin at bedtime.   No  facility-administered encounter medications on file as of 06/17/2019.    ALLERGIES: Allergies  Allergen Reactions  . Lisinopril Other (See Comments)    Hyperkalemia / Renal failure    VACCINATION STATUS: Immunization History  Administered Date(s) Administered  . Influenza,inj,Quad PF,6+ Mos 05/20/2015, 04/07/2017, 03/09/2018, 05/01/2019  . Influenza-Unspecified 05/10/2011  . PPD Test 09/16/2016    Diabetes He presents for his follow-up diabetic visit. He has type 2 diabetes mellitus. Onset  time: Patient was diagnosed with A1c of greater than 14% in 2017 at the age of 62 years. His disease course has been improving. Pertinent negatives for hypoglycemia include no confusion, headaches, pallor or seizures. Associated symptoms include blurred vision. Pertinent negatives for diabetes include no chest pain, no fatigue, no polydipsia, no polyphagia, no polyuria and no weakness. There are no hypoglycemic complications. Symptoms are improving. Diabetic complications include nephropathy and PVD. 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 Current diabetic treatment includes insulin injections (He cannot commit to whether he is consistent on Lantus 60 units nightly.). He is compliant with treatment none of the time. He is following a generally unhealthy diet. When asked about meal planning, he reported none. He has not had a previous visit with a dietitian. He never participates in exercise. (He did not bring any logs nor meter to review today.  He denies hypoglycemia.  His point-of-care A1c was 7.1%, significantly improving from greater than 14%.     ) An ACE inhibitor/angiotensin II receptor blocker is not being taken.  Hyperlipidemia This is a chronic problem. The current  episode started more than 1 year ago. The problem is uncontrolled. Exacerbating diseases include diabetes and hypothyroidism. Pertinent negatives include no chest pain, myalgias or shortness of breath. Current antihyperlipidemic treatment includes statins. Risk factors for coronary artery disease include diabetes mellitus, dyslipidemia and a sedentary lifestyle.  Thyroid Problem Presents for initial visit. Onset time: He reports thyroidectomy in 2013 due to benign nodular goiter. Patient reports no constipation, diarrhea, fatigue or palpitations. Past treatments include levothyroxine. The treatment provided moderate relief. Prior procedures include thyroidectomy. His past medical history is significant for diabetes and hyperlipidemia.   Review of systems: Limited as above.  Bilateral above  knee amputations,  wheelchair-bound.  Objective:    BP (!) 176/90   Pulse 63   Wt Readings from Last 3 Encounters:  02/15/18 134 lb 14.7 oz (61.2 kg)  02/05/18 138 lb 14.2 oz (63 kg)  11/30/17 145 lb (65.8 kg)      Diabetic Labs (most recent): Lab Results  Component Value Date   HGBA1C 7.1 (A) 06/17/2019   HGBA1C 11.3 (H) 03/26/2019   HGBA1C 14.3 (H) 11/14/2018     Lipid Panel ( most recent) Lipid Panel     Component Value Date/Time   CHOL 117 04/04/2019 0815   TRIG 150 (H) 04/04/2019 0815   HDL 35 (L) 04/04/2019 0815   CHOLHDL 3.3 04/04/2019 0815   LDLCALC 56 04/04/2019 0815     Recent Results (from the past 2160 hour(s))  Comprehensive metabolic panel     Status: Abnormal   Collection Time: 03/26/19 12:00 AM  Result Value Ref Range   Glucose 157 (H) 65 - 99 mg/dL   BUN 11 8 - 27 mg/dL   Creatinine, Ser 1.17 0.76 - 1.27 mg/dL   GFR calc non Af Amer 66 >59 mL/min/1.73   GFR calc Af Amer 76 >59 mL/min/1.73   BUN/Creatinine Ratio 9 (L) 10 - 24   Sodium 140 134 - 144 mmol/L   Potassium 3.6 3.5 - 5.2 mmol/L   Chloride 102 96 - 106 mmol/L   CO2 22 20 - 29 mmol/L   Calcium 8.4 (L) 8.6  - 10.2 mg/dL   Total Protein 8.4 6.0 - 8.5 g/dL   Albumin 3.6 (L) 3.8 -  4.8 g/dL   Globulin, Total 4.8 (H) 1.5 - 4.5 g/dL   Albumin/Globulin Ratio 0.8 (L) 1.2 - 2.2   Bilirubin Total 0.6 0.0 - 1.2 mg/dL   Alkaline Phosphatase 143 (H) 39 - 117 IU/L   AST 93 (H) 0 - 40 IU/L   ALT 44 0 - 44 IU/L  Hgb A1c w/o eAG     Status: Abnormal   Collection Time: 03/26/19 12:00 AM  Result Value Ref Range   Hgb A1c MFr Bld 11.3 (H) 4.8 - 5.6 %    Comment:          Prediabetes: 5.7 - 6.4          Diabetes: >6.4          Glycemic control for adults with diabetes: <7.0   Microalbumin/Creatinine Ratio, Urine     Status: None   Collection Time: 04/04/19  8:15 AM  Result Value Ref Range   Creatinine, Urine CANCELED mg/dL    Comment: Test not performed. Patient was unable to provide a self-collected specimen for the requested testing. The following test(s) were not performed:  Result canceled by the ancillary.    Microalbumin, Urine CANCELED     Comment: Test not performed  Result canceled by the ancillary.   TSH     Status: Abnormal   Collection Time: 04/04/19  8:15 AM  Result Value Ref Range   TSH 15.400 (H) 0.450 - 4.500 uIU/mL  T4, Free     Status: None   Collection Time: 04/04/19  8:15 AM  Result Value Ref Range   Free T4 1.30 0.82 - 1.77 ng/dL  Comprehensive metabolic panel     Status: Abnormal   Collection Time: 04/04/19  8:15 AM  Result Value Ref Range   Glucose 134 (H) 65 - 99 mg/dL   BUN 11 8 - 27 mg/dL   Creatinine, Ser 1.29 (H) 0.76 - 1.27 mg/dL   GFR calc non Af Amer 59 (L) >59 mL/min/1.73   GFR calc Af Amer 68 >59 mL/min/1.73   BUN/Creatinine Ratio 9 (L) 10 - 24   Sodium 137 134 - 144 mmol/L   Potassium 3.5 3.5 - 5.2 mmol/L   Chloride 98 96 - 106 mmol/L   CO2 21 20 - 29 mmol/L   Calcium 7.8 (L) 8.6 - 10.2 mg/dL   Total Protein 8.4 6.0 - 8.5 g/dL   Albumin 3.6 (L) 3.8 - 4.8 g/dL   Globulin, Total 4.8 (H) 1.5 - 4.5 g/dL   Albumin/Globulin Ratio 0.8 (L) 1.2 - 2.2    Bilirubin Total 1.0 0.0 - 1.2 mg/dL   Alkaline Phosphatase 126 (H) 39 - 117 IU/L   AST 42 (H) 0 - 40 IU/L   ALT 25 0 - 44 IU/L  Lipid Panel     Status: Abnormal   Collection Time: 04/04/19  8:15 AM  Result Value Ref Range   Cholesterol, Total 117 100 - 199 mg/dL   Triglycerides 150 (H) 0 - 149 mg/dL   HDL 35 (L) >39 mg/dL   VLDL Cholesterol Cal 26 5 - 40 mg/dL   LDL Chol Calc (NIH) 56 0 - 99 mg/dL   Chol/HDL Ratio 3.3 0.0 - 5.0 ratio    Comment:                                   T. Chol/HDL Ratio  Men  Women                               1/2 Avg.Risk  3.4    3.3                                   Avg.Risk  5.0    4.4                                2X Avg.Risk  9.6    7.1                                3X Avg.Risk 23.4   11.0   VITAMIN D 25 Hydroxy (Vit-D Deficiency, Fractures)     Status: Abnormal   Collection Time: 04/04/19  8:15 AM  Result Value Ref Range   Vit D, 25-Hydroxy 6.0 (L) 30.0 - 100.0 ng/mL    Comment: Vitamin D deficiency has been defined by the [place] practice guideline as a level of serum 25-OH vitamin D less than 20 ng/mL (1,2). The Endocrine Society went on to further define vitamin D insufficiency as a level between 21 and 29 ng/mL (2). 1. IOM (Institute of Medicine). 2010. Dietary reference    intakes for calcium and D. [place]: The    [employer]. 2. Holick MF, Binkley Sharon, Bischoff-Ferrari HA, et al.    Evaluation, treatment, and prevention of vitamin D    deficiency: an Endocrine Society clinical practice    guideline. JCEM. 2011 Jul; 96(7):1911-30.   HgB A1c     Status: Abnormal   Collection Time: 06/17/19  4:57 PM  Result Value Ref Range   Hemoglobin A1C 7.1 (A) 4.0 - 5.6 %   HbA1c POC (<> result, manual entry)     HbA1c, POC (prediabetic range)     HbA1c, POC (controlled diabetic range)         Assessment & Plan:   1. Type 2 diabetes mellitus  with stage 4 chronic kidney disease, with long-term current use of insulin (HCC)  - Karter Hellmer has currently uncontrolled symptomatic type 2 DM since 64 years of age. -He is not a reliable historian, did not bring any logs nor meter to review.  His A1c is 7.1% on point-of-care today, generally improving from 14.3%.   -He reports improved glycemic profile, denies hypoglycemia.   -This patient has history of heavy alcohol use/abuse which might have contributed to the pathogenesis of his diabetes. -I had a long discussion with him about the progressive nature of type 2 diabetes and the pathology behind its complications.   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 These are all discussed in detail with him.  - I have counseled him on diet management by adopting a carbohydrate restricted/protein rich diet.  - he  admits there is a room for improvement in his diet and drink choices. -  Suggestion is made for him to avoid simple carbohydrates  from his diet including Cakes, Sweet Desserts / Pastries,  Ice Cream, Soda (diet and regular), Sweet Tea, Candies, Chips, Cookies, Sweet Pastries,  Store Bought Juices, Alcohol in Excess of  1-2 drinks a day, Artificial Sweeteners, Coffee Creamer, and "Sugar-free" Products. This will help patient to have stable blood glucose profile and potentially avoid unintended weight gain.  - I encouraged him to switch to  unprocessed or minimally processed complex starch and increased protein intake (animal or plant source), fruits, and vegetables.  - he is advised to stick to a routine mealtimes to eat 3 meals  a day and avoid unnecessary snacks ( to snack only to correct hypoglycemia).   - he has been scheduled with Jearld Fenton, RDN, CDE for individualized diabetes education.  - I have approached him with the following individualized plan to manage diabetes and patient agrees:   -Given his response to his basal insulin, he would not require prandial insulin for now.    -#1 priority in his care will be to avoid hypoglycemia.  I approached him for lower dose of Lantus.  I discussed and lowered his Lantus to 50 units nightly, associated with monitoring of blood glucose at least twice daily-daily before breakfast and at bedtime.  - he is warned not to take insulin without proper monitoring per orders.  - he is encouraged to call clinic for blood glucose levels less than 70 or above 300 mg /dl. -He remains too high risk to give multiple daily injections of insulin.    -In this patient with high likelihood of pancreatic diabetes, tight glycemic profile is not advised as he may be lacking endogenous glucagon response which puts him at high risk for hypoglycemia.  - he is not a candidate for metformin, SGLT2 inhibitors due to concurrent renal insufficiency. -He is not a candidate for incretin therapy due to his background history of alcoholism with possible risk of pancreatitis.   - Patient specific target  A1c;  LDL, HDL, Triglycerides, and  Waist Circumference were discussed in detail.  2) Blood Pressure /Hypertension: His blood pressure is not controlled to target.  He continues to smoke heavily.  He did not take his blood pressure medications this morning.  He is advised to Resume and  Continue Toprol 100 mg p.o. daily. He will be considered for low-dose losartan next visit.  3) Lipids/Hyperlipidemia:   Review of his recent lipid panel showed uncontrolled hypertriglyceridemia at 556.  He is advised to continue atorvastatin 40 mg p.o. nightly, advised to continue fenofibrate 145 mg p.o. daily at bedtime.  Side effects and precautions discussed with him.  4)  Weight/Diet: -   CDE Consult has been initiated .  Exercise, and detailed carbohydrates information provided  -  detailed on discharge instructions.  He cannot exercise optimally, wheelchair-bound due to bilateral above-knee amputations.  5) postsurgical hypothyroidism -He underwent total thyroidectomy in 2013 for what appears to be benign nodular goiter. -Due to lab evidence of inadequate replacement, His levothyroxine was recently adjusted at 125 mcg p.o. daily before breakfast.     - We discussed about the correct intake of his thyroid hormone, on empty stomach at fasting, with water, separated by at least 30 minutes from breakfast and other medications,  and separated by more than 4 hours from calcium, iron, multivitamins, acid reflux medications (PPIs). -Patient is made aware of the fact that thyroid hormone replacement is needed for life, dose to be adjusted by periodic monitoring of thyroid function tests.    6) profound vitamin D deficiency: -He is advised to continue  vitamin D 2 50,000 units weekly, and added vitamin D3 5000 units daily.   7) Chronic Care/Health Maintenance:  -he  is on  statin medications and  is encouraged to initiate and continue to follow up with Ophthalmology, Dentist,  Podiatrist at least yearly or according to recommendations, and advised to stay away from smoking. I have recommended yearly flu vaccine and pneumonia vaccine at least every 5 years; moderate intensity exercise for up to 150 minutes weekly; and  sleep for at least 7 hours a day.  Patient does not have adequate social support, he has transportation concerns.  He may benefit from home assessment by Education officer, museum.  The patient was counseled on the dangers of tobacco use, and was advised to quit.  Reviewed strategies to maximize success, including removing cigarettes and smoking materials from environment.   - he is  advised to maintain close follow up with [name], MD for primary care needs, as well as his other providers for optimal and  coordinated care.    - Time spent on this patient care encounter:  35 min, of which > 50% was spent in  counseling and the rest reviewing his blood glucose logs , discussing his hypoglycemia and hyperglycemia episodes, reviewing his current and  previous labs / studies  ( including abstraction from other facilities) and medications  doses and developing a  long term treatment plan and documenting his care.   Please refer to Patient Instructions for Blood Glucose Monitoring and Insulin/Medications Dosing Guide"  in media tab for additional information. Please  also refer to " Patient Self Inventory" in the Media  tab for reviewed elements of pertinent patient history.  [name] participated in the discussions, expressed understanding, and voiced agreement with the above plans.  All questions were answered to his satisfaction. he is encouraged to contact clinic should he have any questions or concerns prior to his return visit.    Follow up plan: - Return in about 3 months (around 09/15/2019) for Follow up with Pre-visit Labs, Next Visit A1c in Office.  Glade Lloyd, MD [facility] [address] [place], [place] [address] Phone: [phone]  Fax: [phone]    06/17/2019, 5:02 PM  This note was partially dictated with voice recognition software. Similar sounding words can be transcribed inadequately or may not  be corrected upon review.

## 2019-06-18 NOTE — Telephone Encounter (Signed)
Spoke with pts sister about Dr Liliane Channel recommendations.

## 2019-06-24 ENCOUNTER — Telehealth: Payer: Self-pay | Admitting: Family Medicine

## 2019-06-24 NOTE — Telephone Encounter (Signed)
Gave sister's [facility]'s phone number to help move pt's hospital bed to his new place

## 2019-06-24 NOTE — Telephone Encounter (Signed)
Daughter states that patient received hospital bed 3-4 years ago and is not sure where he got the hospital bed from.  No notes in system.

## 2019-06-27 ENCOUNTER — Ambulatory Visit (INDEPENDENT_AMBULATORY_CARE_PROVIDER_SITE_OTHER): Payer: Medicare HMO | Admitting: Family Medicine

## 2019-06-27 ENCOUNTER — Telehealth: Payer: Self-pay | Admitting: Family Medicine

## 2019-06-27 ENCOUNTER — Encounter: Payer: Self-pay | Admitting: Family Medicine

## 2019-06-27 DIAGNOSIS — I10 Essential (primary) hypertension: Secondary | ICD-10-CM | POA: Diagnosis not present

## 2019-06-27 DIAGNOSIS — Z89611 Acquired absence of right leg above knee: Secondary | ICD-10-CM | POA: Diagnosis not present

## 2019-06-27 DIAGNOSIS — Z89612 Acquired absence of left leg above knee: Secondary | ICD-10-CM | POA: Diagnosis not present

## 2019-06-27 DIAGNOSIS — F1020 Alcohol dependence, uncomplicated: Secondary | ICD-10-CM

## 2019-06-27 DIAGNOSIS — F4321 Adjustment disorder with depressed mood: Secondary | ICD-10-CM | POA: Diagnosis not present

## 2019-06-27 MED ORDER — CETIRIZINE HCL 10 MG PO TABS: 10.0000 mg | ORAL_TABLET | Freq: Every day | ORAL | 3 refills | Status: DC

## 2019-06-27 MED ORDER — SERTRALINE HCL 50 MG PO TABS: 50.0000 mg | ORAL_TABLET | Freq: Every day | ORAL | 5 refills | Status: DC

## 2019-06-27 NOTE — Telephone Encounter (Signed)
I sent in zyrtec

## 2019-06-27 NOTE — Progress Notes (Signed)
Subjective:    Patient ID: Todd [name], male    DOB: Dec 08, 1955, 64 y.o.   MRN: [id number]   HPI: Todd [name] is a 63 y.o. male presenting for potential ulcers on his buttocks. Donut would help prevent decubiti. Pt.s caregiver, his mother,  passed away last month. He is grieving. Has been crying. Feels wothless, unable to do anything for himself due to bilateral AKA. Moving into a handicap accessible apartment. Needs equipment and occ therapy to help him to adapt to the environment. Sister tells me he is drinking and depressed.  Depression screen [facility] 2/9 05/01/2019 10/02/2018 07/17/2018 05/14/2018 03/15/2018  Decreased Interest 0 0 0 0 0  Down, Depressed, Hopeless 0 0 0 0 0  PHQ - 2 Score 0 0 0 0 0     Relevant past medical, surgical, family and social history reviewed and updated as indicated.  Interim medical history since our last visit reviewed. Allergies and medications reviewed and updated.  ROS:  Review of Systems  Constitutional: Negative for fever.  Respiratory: Negative for shortness of breath.   Cardiovascular: Negative for chest pain.  Musculoskeletal: Negative for arthralgias.  Skin: Negative for rash.     Social History   Tobacco Use  Smoking Status Current Every Day Smoker  . Packs/day: 0.25  . Years: 45.00  . Pack years: 11.25  . Types: Cigarettes  . Last attempt to quit: 11/12/2014  . Years since quitting: 4.6  Smokeless Tobacco Never Used       Objective:     Wt Readings from Last 3 Encounters:  02/15/18 134 lb 14.7 oz (61.2 kg)  02/05/18 138 lb 14.2 oz (63 kg)  11/30/17 145 lb (65.8 kg)     Exam deferred. Pt. Harboring due to COVID 19. Phone visit performed.   Assessment & Plan:   1. S/P AKA (above knee amputation) bilateral ([place])   2. Grief   3. Alcoholism ([place])   4. Essential hypertension, benign     Meds ordered this encounter  Medications  . sertraline (ZOLOFT) 50 MG tablet    Sig: Take 1 tablet (50 mg total) by mouth at  bedtime. For anxiety and depression    Dispense:  30 tablet    Refill:  5  . cetirizine (ZYRTEC) 10 MG tablet    Sig: Take 1 tablet (10 mg total) by mouth daily. For allergy symptoms    Dispense:  90 tablet    Refill:  3    Orders Placed This Encounter  Procedures  . For home use only DME Other see comment    Shower chair  Seat donut    Order Specific Question:   Length of Need    Answer:   Lifetime  . CCM Social Work    Referral Priority:   Routine    Referral Type:   Consultation    Referral Reason:   Care Coordination    Number of Visits Requested:   1  . PT self-care home management    Standing Status:   Future    Standing Expiration Date:   06/28/2020      Diagnoses and all orders for this visit:  S/P AKA (above knee amputation) bilateral ([place]) -     CCM Social Work -     PT self-care home management; Future -     For home use only DME Other see comment  Grief -     CCM Social Work  Alcoholism [facility]) -  CCM Social Work  Essential hypertension, benign  Other orders -     sertraline (ZOLOFT) 50 MG tablet; Take 1 tablet (50 mg total) by mouth at bedtime. For anxiety and depression -     cetirizine (ZYRTEC) 10 MG tablet; Take 1 tablet (10 mg total) by mouth daily. For allergy symptoms    Virtual Visit via telephone Note  I discussed the limitations, risks, security and privacy concerns of performing an evaluation and management service by telephone and the availability of in person appointments. The patient was identified with two identifiers. Pt.expressed understanding and agreed to proceed. Pt. Is at homewi. Dr. Livia Snellen is in his office.  Follow Up Instructions:   I discussed the assessment and treatment plan with the patient. The patient was provided an opportunity to ask questions and all were answered. The patient agreed with the plan and demonstrated an understanding of the instructions.   The patient was advised to call back or seek an in-person  evaluation if the symptoms worsen or if the condition fails to improve as anticipated.   Total minutes including chart review and phone contact time: 27   Follow up plan: Return in about 1 month (around 07/28/2019).  [name], MD [place]

## 2019-06-27 NOTE — Telephone Encounter (Signed)
Pt forgot to mention that he needs an rx for allergies use [facility].

## 2019-06-28 ENCOUNTER — Telehealth: Payer: Self-pay | Admitting: Family Medicine

## 2019-06-28 NOTE — Telephone Encounter (Signed)
Needs to speak with [facility] regarding patients wheelchair.

## 2019-06-28 NOTE — Telephone Encounter (Signed)
NA / NVM 

## 2019-06-28 NOTE — Telephone Encounter (Signed)
Patients sister called stating that she needed to speak with Cyril Mourning or Nicki Reaper about getting patient occupational therapy and equipment, that will be covered by Medicare. Was told by Dr Livia Snellen to contact one of them about this.

## 2019-06-28 NOTE — Telephone Encounter (Signed)
For nurse of Provider to assist.

## 2019-06-28 NOTE — Telephone Encounter (Signed)
I'll be glad to talk with them about the CCM program and what we have to offer. I have him scheduled for a call next week. As for home health PT and DME supplies, those need to be ordered by the provider. His recent visit should be sufficient for those orders. I will route to [facility] clinical staff to address today.   Chong Sicilian, BSN, RN-BC Embedded Chronic Care Manager [facility] / [place] Management Direct Dial: [phone]

## 2019-06-29 ENCOUNTER — Encounter: Payer: Self-pay | Admitting: Family Medicine

## 2019-06-29 DIAGNOSIS — Z89612 Acquired absence of left leg above knee: Secondary | ICD-10-CM | POA: Diagnosis not present

## 2019-06-29 DIAGNOSIS — Z89611 Acquired absence of right leg above knee: Secondary | ICD-10-CM | POA: Diagnosis not present

## 2019-06-29 NOTE — Telephone Encounter (Signed)
I placed a PT order, a shower chair, and a doughnut order.  If there are any things otherwise that the patient would like ordered let me know. WS

## 2019-07-01 NOTE — Telephone Encounter (Signed)
Community message has been sent to [employer] rep w/ [place] about changing out wheelchair, current wheelchair that was received a couple of months ago is too small. Waiting on response

## 2019-07-01 NOTE — Telephone Encounter (Signed)
Pt sister aware orders placed and CCM call set up tomorrow at 10:30. Pt is moving to a handicap apartment tomorrow. Address updated in chart.

## 2019-07-02 ENCOUNTER — Other Ambulatory Visit: Payer: Self-pay | Admitting: Family Medicine

## 2019-07-02 ENCOUNTER — Ambulatory Visit (INDEPENDENT_AMBULATORY_CARE_PROVIDER_SITE_OTHER): Payer: Medicare HMO | Admitting: *Deleted

## 2019-07-02 DIAGNOSIS — Z89612 Acquired absence of left leg above knee: Secondary | ICD-10-CM

## 2019-07-02 DIAGNOSIS — E1165 Type 2 diabetes mellitus with hyperglycemia: Secondary | ICD-10-CM

## 2019-07-02 DIAGNOSIS — I10 Essential (primary) hypertension: Secondary | ICD-10-CM

## 2019-07-02 DIAGNOSIS — Z89611 Acquired absence of right leg above knee: Secondary | ICD-10-CM

## 2019-07-02 DIAGNOSIS — Z748 Other problems related to care provider dependency: Secondary | ICD-10-CM

## 2019-07-02 NOTE — Chronic Care Management (AMB) (Signed)
Chronic Care Management   Initial Visit Note  07/02/2019 Name: Todd [name] MRN: [id number] DOB: [birth year]  Referred by: [name], MD Reason for referral : Chronic Care Management (RN Initial Outreach)   Todd [name] is a 64 y.o. year old male who is a primary care patient of [name], [name], MD. The CCM team was consulted for assistance with chronic disease management and care coordination needs related to HTN, DM, hyperlipidemia, hypothyroidism, CKD, hyperlipidemia, anxiety/depression, bilateral AKA.  Review of patient status, including review of consultants reports, relevant laboratory and other test results, and collaboration with appropriate care team members and the patient's provider was performed as part of comprehensive patient evaluation and provision of chronic care management services.    SDOH (Social Determinants of Health) screening performed today: Alcohol/Substance Use Tobacco Use Stress Physical Activity. See Care Plan for related entries.   Subjective:  Objective:  Outpatient Encounter Medications as of 07/02/2019  Medication Sig  . aspirin EC 81 MG tablet Take 81 mg by mouth daily.  [name] atorvastatin (LIPITOR) 40 MG tablet Take 1 tablet (40 mg total) by mouth at bedtime.  . blood glucose meter kit and supplies Dispense based on patient and insurance preference. Use up to four times daily as directed. (FOR ICD-10 E10.9, E11.9).  . cetirizine (ZYRTEC) 10 MG tablet Take 1 tablet (10 mg total) by mouth daily. For allergy symptoms  . folic acid (FOLVITE) 1 MG tablet Take 1 tablet (1 mg total) by mouth daily.  [name] glucose blood (ACCU-CHEK AVIVA PLUS) test strip Check blood sugars four times a day  . Insulin Glargine (LANTUS SOLOSTAR) 100 UNIT/ML Solostar Pen Inject 50 Units into the skin at bedtime.  . Insulin Pen Needle 31G X 5 MM MISC 1 Device by Does not apply route as directed.  [name] levothyroxine (SYNTHROID) 125 MCG tablet Take 1 tablet (125 mcg total) by mouth  daily before breakfast.  . metoprolol succinate (TOPROL-XL) 100 MG 24 hr tablet Take 1 tablet (100 mg total) by mouth daily. Take with or immediately following a meal.  . sertraline (ZOLOFT) 50 MG tablet Take 1 tablet (50 mg total) by mouth at bedtime. For anxiety and depression  . Vitamin D, Ergocalciferol, (DRISDOL) 1.25 MG (50000 UT) CAPS capsule Take 1 capsule (50,000 Units total) by mouth every 7 (seven) days.  . fenofibrate (TRICOR) 145 MG tablet Take 1 tablet (145 mg total) by mouth daily. (Patient not taking: Reported on 07/02/2019)NOT TAKING  . thiamine 100 MG tablet Take 1 tablet (100 mg total) by mouth daily. (Patient not taking: Reported on 07/02/2019)   No facility-administered encounter medications on file as of 07/02/2019.    Lab Results  Component Value Date   HGBA1C 7.1 (A) 06/17/2019   HGBA1C 11.3 (H) 03/26/2019   HGBA1C 14.3 (H) 11/14/2018   Lab Results  Component Value Date   LDLCALC 56 04/04/2019   CREATININE 1.29 (H) 04/04/2019   BP Readings from Last 3 Encounters:  06/17/19 (!) 176/90  05/01/19 (!) 163/82  12/10/18 (!) 153/88   Fall Risk  07/02/2019 05/01/2019 12/10/2018 10/02/2018 09/26/2018  Falls in the past year? 1 Exclusion - non ambulatory 0 0 0  Number falls in past yr: 1 - - - -  Injury with Fall? 0 - - - -  Risk for fall due to : History of fall(s);Impaired mobility - Impaired mobility;Impaired balance/gait;Medication side effect Impaired mobility Impaired mobility;Impaired balance/gait;History of fall(s)  Follow up Falls prevention discussed - Falls evaluation completed - Falls evaluation completed  RN Assessment & Care Plan           This Visit's Progress   . Care Coordination       Current Barriers:  [name] Knowledge Deficits related to how to obtain home health physical therapy . Transportation barriers  Nurse Case Manager Clinical Goal(s):  [name] Over the next 7 days, patient will be contacted by home health agency to setup in home physical  therapy . Over the next 90 days, patient will work with physical therapist to increase strength and balance  Interventions:  . Chart reviewed . Collaborated with Dr Livia Snellen and clinical team to order physical therapy and DME supplies to assist in self-care (shower bench with back and doughnut) . Talked with patient and patient's sister by telephone . RN will ask [facility] clinical staff to reorder Home Health PT/OT because the order was placed incorrectly  Patient Self Care Activities:  . Patient verbalizes understanding of plan to obtain home health physical/occupational therapy . Self administers medications as prescribed . Calls provider office for new concerns or questions . Unable to independently walk or drive due to AKA. Performs most ADLs with accommodations.   Initial goal documentation     . Chronic Disease Management Needs        Current Barriers:  . Chronic Disease Management support, education, and care coordination needs related to HTN, DM, hyperlipidemia, hypothyroidism, CKD, hyperlipidemia, anxiety/depression, bilateral AKA  Clinical Goal(s) related to HTN, DM, hyperlipidemia, hypothyroidism, CKD, hyperlipidemia, anxiety/depression, bilateral AKA:  Over the next 60 days, patient will:  . Work with the care management team to address educational, disease management, and care coordination needs  . Begin or continue self health monitoring activities as directed today Measure and record blood pressure 5 times per week . Call provider office for new or worsened signs and symptoms Blood pressure findings outside established parameters . Call care management team with questions or concerns . Verbalize basic understanding of patient centered plan of care established today  Interventions related to HTN, DM, hyperlipidemia, hypothyroidism, CKD, hyperlipidemia, anxiety/depression, bilateral AKA:  . Evaluation of current treatment plans and patient's adherence to plan as established by  provider . Assessed patient understanding of disease states . Assessed patient's education and care coordination needs . Provided disease specific education to patient  . Collaborated with appropriate clinical care team members regarding patient needs  Patient Self Care Activities related to HTN, DM, hyperlipidemia, hypothyroidism, CKD, hyperlipidemia, anxiety/depression, bilateral AKA:  . Patient is unable to independently self-manage chronic health conditions  Initial goal documentation     . Medication Management       Current Barriers:  . Chronic Disease Management support and education needs related to hypertension  Nurse Case Manager Clinical Goal(s):  [name] Over the next 7 days, patient will state understanding of which medications he's prescribed and how to take them  Interventions:  . Evaluation of current treatment plan related to hypertension and patient's adherence to plan as established by provider. . Reviewed medications with patient and discussed carvedilol and metoprolol . Discussed plans with patient for ongoing care management follow up and provided patient with direct contact information for care management team . Chart reviewed o Carvedilol d/c 07/2018 by Cardio. Metoprolol was continued.  . Advised patient and sister that carvedilol was discontinued and that he should continue taking metoprolol.  . Recommended that he check and record his blood pressure and pulse daily for 1 to 2 weeks . Call provider with any readings outside of provider  recommended parameters  Patient Self Care Activities:  . Patient verbalizes understanding of plan to d/c carvedilol . Self administers medications as prescribed . Calls provider office for new concerns or questions . Unable to independently walk or drive . Unable to perform IADLs independently  Initial goal documentation     . Patient Stated       Current Barriers:  [name] Knowledge Deficits related to fall  precautions . Decreased adherence to prescribed treatment for fall prevention  Nurse Case Manager Clinical Goal(s):  [name] Over the next 30 days, patient will demonstrate improved adherence to prescribed treatment plan for decreasing falls as evidenced by patient reporting and review of EMR . Over the next 30days, patient will verbalize using fall risk reduction strategies discussed . Over the next 90 days, patient will not experience additional falls  Interventions:  . Provided verbal education re: Potential causes of falls and Fall prevention strategies . Reviewed medications and discussed potential side effects of medications such as dizziness and frequent urination . Assessed for s/s of orthostatic hypotension . Assessed for falls since last encounter. . Assessed patients knowledge of fall risk prevention secondary to previously provided education. . Confirmed that patient has life alert system  Patient Self Care Activities:  . Utilize wheelchair (assistive device) appropriately with all ambulation . De-clutter walkways . Change positions slowly . Wear secure fitting shoes at all times with ambulation . Utilize home lighting for dim lit areas . Have self and pet awareness at all times  Initial goal documentation     . Transportation Needs       Current Barriers:  [name] Knowledge Deficits related to transportation assistance . Transportation barriers . Bilateral AKA  Nurse Case Manager Clinical Goal(s):  [name] Over the next 30 days, patient will understand plan for transportation assistance . Over the next 30 days, patient/family will talk with transportation services to arrange transport to and from doctor's appointments  Interventions:  . Provided contact information for [place] and [facility] . Provided information on [facility] . Discussed that Mcarthur Rossetti has travel assistance options . Referral to Care Guide to research [employer] travel assistance options  Patient Self Care  Activities:  . Patient verbalizes understanding of plan to obtain travel assistance information . Calls provider office for new concerns or questions . Unable to independently drive and walk due to bilateral AKA . Can perform most ADLs in his home with accommodations.   Initial goal documentation         Todd [name] was given information about Chronic Care Management services today including:  1. CCM service includes personalized support from designated clinical staff supervised by his physician, including individualized plan of care and coordination with other care providers 2. 24/7 contact phone numbers for assistance for urgent and routine care needs. 3. Service will only be billed when office clinical staff spend 20 minutes or more in a month to coordinate care. 4. Only one practitioner may furnish and bill the service in a calendar month. 5. The patient may stop CCM services at any time (effective at the end of the month) by phone call to the office staff. 6. The patient will be responsible for cost sharing (co-pay) of up to 20% of the service fee (after annual deductible is met).  Patient agreed to services and verbal consent obtained.   Follow-up Plan: The care management team will reach out to the patient again over the next 14 days.    Chong Sicilian, BSN, RN-BC Embedded Chronic  Care Manager [place] / [place] Management Direct Dial: [phone]

## 2019-07-02 NOTE — Patient Instructions (Signed)
Visit Information  Goals Addressed            This Visit's Progress   . Care Coordination       Current Barriers:  [name] Knowledge Deficits related to how to obtain home health physical therapy . Transportation barriers  Nurse Case Manager Clinical Goal(s):  [name] Over the next 7 days, patient will be contacted by home health agency to setup in home physical therapy . Over the next 90 days, patient will work with physical therapist to increase strength and balance  Interventions:  . Chart reviewed . Collaborated with Dr Livia Snellen and clinical team to order physical therapy and DME supplies to assist in self-care (shower bench with back and doughnut) . Talked with patient and patient's sister by telephone . RN will ask [facility] clinical staff to reorder Home Health PT/OT because the order was placed incorrectly  Patient Self Care Activities:  . Patient verbalizes understanding of plan to obtain home health physical/occupational therapy . Self administers medications as prescribed . Calls provider office for new concerns or questions . Unable to independently walk or drive due to AKA. Performs most ADLs with accommodations.   Initial goal documentation     . Chronic Disease Management Needs        Current Barriers:  . Chronic Disease Management support, education, and care coordination needs related to HTN, DM, hyperlipidemia, hypothyroidism, CKD, hyperlipidemia, anxiety/depression, bilateral AKA  Clinical Goal(s) related to HTN, DM, hyperlipidemia, hypothyroidism, CKD, hyperlipidemia, anxiety/depression, bilateral AKA:  Over the next 60 days, patient will:  . Work with the care management team to address educational, disease management, and care coordination needs  . Begin or continue self health monitoring activities as directed today Measure and record blood pressure 5 times per week . Call provider office for new or worsened signs and symptoms Blood pressure findings outside established  parameters . Call care management team with questions or concerns . Verbalize basic understanding of patient centered plan of care established today  Interventions related to HTN, DM, hyperlipidemia, hypothyroidism, CKD, hyperlipidemia, anxiety/depression, bilateral AKA:  . Evaluation of current treatment plans and patient's adherence to plan as established by provider . Assessed patient understanding of disease states . Assessed patient's education and care coordination needs . Provided disease specific education to patient  . Collaborated with appropriate clinical care team members regarding patient needs  Patient Self Care Activities related to HTN, DM, hyperlipidemia, hypothyroidism, CKD, hyperlipidemia, anxiety/depression, bilateral AKA:  . Patient is unable to independently self-manage chronic health conditions  Initial goal documentation     . Medication Management       Current Barriers:  . Chronic Disease Management support and education needs related to hypertension  Nurse Case Manager Clinical Goal(s):  [name] Over the next 7 days, patient will state understanding of which medications he's prescribed and how to take them  Interventions:  . Evaluation of current treatment plan related to hypertension and patient's adherence to plan as established by provider. . Reviewed medications with patient and discussed carvedilol and metoprolol . Discussed plans with patient for ongoing care management follow up and provided patient with direct contact information for care management team . Chart reviewed o Carvedilol d/c 07/2018 by Cardio. Metoprolol was continued.  . Advised patient and sister that carvedilol was discontinued and that he should continue taking metoprolol.  . Recommended that he check and record his blood pressure and pulse daily for 1 to 2 weeks . Call provider with any readings outside of provider  recommended parameters  Patient Self Care Activities:  . Patient  verbalizes understanding of plan to d/c carvedilol . Self administers medications as prescribed . Calls provider office for new concerns or questions . Unable to independently walk or drive . Unable to perform IADLs independently  Initial goal documentation     . Patient Stated       Current Barriers:  [name] Knowledge Deficits related to fall precautions . Decreased adherence to prescribed treatment for fall prevention  Nurse Case Manager Clinical Goal(s):  [name] Over the next 30 days, patient will demonstrate improved adherence to prescribed treatment plan for decreasing falls as evidenced by patient reporting and review of EMR . Over the next 30days, patient will verbalize using fall risk reduction strategies discussed . Over the next 90 days, patient will not experience additional falls  Interventions:  . Provided verbal education re: Potential causes of falls and Fall prevention strategies . Reviewed medications and discussed potential side effects of medications such as dizziness and frequent urination . Assessed for s/s of orthostatic hypotension . Assessed for falls since last encounter. . Assessed patients knowledge of fall risk prevention secondary to previously provided education. . Confirmed that patient has life alert system  Patient Self Care Activities:  . Utilize wheelchair (assistive device) appropriately with all ambulation . De-clutter walkways . Change positions slowly . Wear secure fitting shoes at all times with ambulation . Utilize home lighting for dim lit areas . Have self and pet awareness at all times  Initial goal documentation     . Transportation Needs       Current Barriers:  [name] Knowledge Deficits related to transportation assistance . Transportation barriers . Bilateral AKA  Nurse Case Manager Clinical Goal(s):  [name] Over the next 30 days, patient will understand plan for transportation assistance . Over the next 30 days, patient/family will talk with  transportation services to arrange transport to and from doctor's appointments  Interventions:  . Provided contact information for [place] and [facility] . Provided information on [facility] . Discussed that Mcarthur Rossetti has travel assistance options . Referral to Care Guide to research [employer] travel assistance options  Patient Self Care Activities:  . Patient verbalizes understanding of plan to obtain travel assistance information . Calls provider office for new concerns or questions . Unable to independently drive and walk due to bilateral AKA . Can perform most ADLs in his home with accommodations.   Initial goal documentation        The care management team will reach out to the patient again over the next 14 days.    Mr. Macdougal was given information about Chronic Care Management services today including:  1. CCM service includes personalized support from designated clinical staff supervised by his physician, including individualized plan of care and coordination with other care providers 2. 24/7 contact phone numbers for assistance for urgent and routine care needs. 3. Service will only be billed when office clinical staff spend 20 minutes or more in a month to coordinate care. 4. Only one practitioner may furnish and bill the service in a calendar month. 5. The patient may stop CCM services at any time (effective at the end of the month) by phone call to the office staff. 6. The patient will be responsible for cost sharing (co-pay) of up to 20% of the service fee (after annual deductible is met).  Patient agreed to services and verbal consent obtained.   Chong Sicilian, BSN, RN-BC Embedded Chronic Care Freight forwarder [facility]  [place] [facility] [place] [facility] Direct Dial: [phone]   The patient verbalized understanding of instructions provided today and declined a print copy of patient instruction materials.

## 2019-07-03 ENCOUNTER — Other Ambulatory Visit: Payer: Self-pay | Admitting: Family Medicine

## 2019-07-03 ENCOUNTER — Ambulatory Visit: Payer: Self-pay | Admitting: *Deleted

## 2019-07-03 ENCOUNTER — Ambulatory Visit: Payer: Self-pay | Admitting: Licensed Clinical Social Worker

## 2019-07-03 DIAGNOSIS — I10 Essential (primary) hypertension: Secondary | ICD-10-CM

## 2019-07-03 DIAGNOSIS — E1165 Type 2 diabetes mellitus with hyperglycemia: Secondary | ICD-10-CM | POA: Diagnosis not present

## 2019-07-03 DIAGNOSIS — N184 Chronic kidney disease, stage 4 (severe): Secondary | ICD-10-CM | POA: Diagnosis not present

## 2019-07-03 DIAGNOSIS — E782 Mixed hyperlipidemia: Secondary | ICD-10-CM

## 2019-07-03 DIAGNOSIS — E1122 Type 2 diabetes mellitus with diabetic chronic kidney disease: Secondary | ICD-10-CM

## 2019-07-03 DIAGNOSIS — Z89611 Acquired absence of right leg above knee: Secondary | ICD-10-CM

## 2019-07-03 DIAGNOSIS — N183 Chronic kidney disease, stage 3 unspecified: Secondary | ICD-10-CM

## 2019-07-03 DIAGNOSIS — Z794 Long term (current) use of insulin: Secondary | ICD-10-CM

## 2019-07-03 NOTE — Patient Instructions (Addendum)
Licensed Clinical Social Worker Visit Information  Goals we discussed today:  Goals Addressed            This Visit's Progress   . Client wants to talk with LCSW in next 30 days to discuss grief issues of clinet related to recent death of his mother (his mother died on 19-Jun-2019) (pt-stated)       Current Barriers:  [name] Mobility challenges in client with Chronic Diagnoses of HTN, Type 2 DM, CKD, HLD  Clinical Social Work Clinical Goal(s):  [name] LCSW will talk with client in next 30 days to discuss client management of grief issues faced  Goals Addressed                 Interventions:  Talked with [name], sister of client about client grief issues over the recent death of his mother  Talked with [name], about client use of alcohol and treatment program in area related to alcohol (ADS in [place], [place])  Talked with [name] about transport needs of client  Talked with [name] about mobility challenges of client  Talked with Scientist, physiological about CCM support services  Patient Self Care Activities:  Takes medications as prescribed   Self Care Deficit:  Mobility challenges  Alcohol use issue   Initial goal documentation            Materials Provided: No  Follow Up Plan: LCSW to call client or [name], sister of client, in next 4 weeks to talk  about grief issues faced by client  The patient /Todd [name] ,sister,verbalized understanding of instructions provided today and declined a print copy of patient instruction materials.   [name].Blanch Stang MSW, LCSW Licensed Clinical Social Worker [facility] [phone]

## 2019-07-03 NOTE — Chronic Care Management (AMB) (Signed)
  Care Management Note   Todd [name] is a 64 y.o. year old male who is a primary care patient of Todd [name], Todd Gash, MD. The CM team was consulted for assistance with chronic disease management and care coordination.   I reached out to Todd [name], sister, by phone today.   Review of patient status, including review of consultants reports, relevant laboratory and other test results, and collaboration with appropriate care team members and the patient's provider was performed as part of comprehensive patient evaluation and provision of chronic care management services.   Social determinants of health:risk of tobacco use; risk of depression; risk of stress; risk of physical inactivity    Chronic Care Management from 07/03/2019 in [place]  PHQ-9 Total Score  6     GAD 7 : Generalized Anxiety Score 07/03/2019  Nervous, Anxious, on Edge 1  Control/stop worrying 0  Worry too much - different things 1  Trouble relaxing 0  Restless 1  Easily annoyed or irritable 1  Afraid - awful might happen 1  Total GAD 7 Score 5  Anxiety Difficulty Somewhat difficult   Medications   aspirin EC 81 MG tablet atorvastatin (LIPITOR) 40 MG tablet blood glucose meter kit and supplies cetirizine (ZYRTEC) 10 MG tablet fenofibrate (TRICOR) 818 MG tablet folic acid (FOLVITE) 1 MG tablet glucose blood (ACCU-CHEK AVIVA PLUS) test strip Insulin Glargine (LANTUS SOLOSTAR) 100 UNIT/ML Solostar Pen Insulin Pen Needle 31G X 5 MM MISC levothyroxine (SYNTHROID) 125 MCG tablet metoprolol succinate (TOPROL-XL) 100 MG 24 hr tablet sertraline (ZOLOFT) 50 MG tablet thiamine 100 MG tablet Vitamin D, Ergocalciferol, (DRISDOL) 1.25 MG (50000 UT) CAPS capsule  Goals Addressed            This Visit's Progress   . Client wants to talk with LCSW in next 30 days to discuss grief issues of clinet related to recent death of his mother (his mother died on 05/31/2019) (pt-stated)      Current Barriers:  [name] Mobility challenges in client with Chronic Diagnoses of HTN, Type 2 DM, CKD, HLD  Clinical Social Work Clinical Goal(s):  [name] LCSW will talk with client in next 30 days to discuss client management of grief issues faced  Interventions: . Talked with Todd [name], sister of client about client grief issues over the recent death of his mother . Talked with Todd [name], about client use of alcohol and treatment program in area related to alcohol (ADS in [place], [place]) . Talked with Todd [name] about transport needs of client . Talked with Todd [name] about mobility challenges of client . Talked with Todd [name] about CCM support services  Patient Self Care Activities:  Takes medications as prescribed  Self Care Deficit:   Mobility challenges Alcohol use issue   Initial goal documentation       Follow Up Plan: LCSW to call client or sister, Todd [name] in next 4 weeks to talk about client grief issues faced  Stanly Si.Pheonix Wisby MSW, LCSW Licensed Clinical Social Worker [facility] [phone]

## 2019-07-03 NOTE — Patient Instructions (Signed)
Visit Information  Goals Addressed            This Visit's Progress     Patient Stated   . Obtain Blood Pressure Machine (pt-stated)       Current Barriers:  . Film/video editor.   Nurse Case Manager Clinical Goal(s):  [name] Over the next 14 days, patient will obtain blood pressure machine for home blood pressure monitoring  Interventions:  . Talked with sister, [name], about home blood pressure monitoring . Sister requested insurance assistance with covering a blood pressure machine o Advised they they normally do not cover prescriptions written for BP machines  . Connected Care referral placed for research into whether his [facility] plan has a book of supplies/resources and if they provide blood pressure machines . Will f/u with patient/sister once I have a response  Patient Self Care Activities:  . Self administers medications as prescribed . Calls provider office for new concerns or questions . Unable to independently walk or drive  Initial goal documentation       The care management team will reach out to the patient again over the next 14 days.    Chong Sicilian, BSN, RN-BC Embedded Chronic Care Manager [facility] [place] [facility] Direct Dial: [phone]   The patient verbalized understanding of instructions provided today and declined a print copy of patient instruction materials.

## 2019-07-03 NOTE — Chronic Care Management (AMB) (Signed)
  Chronic Care Management   Follow Up Note   07/03/2019 Name: Todd [name] MRN: [id number] DOB: [birth year]  Referred by: Todd Fraise, MD Reason for referral : Chronic Care Management   Todd [name] is a 64 y.o. year old male who is a primary care patient of [name], [name], MD. The CCM team was consulted for assistance with chronic disease management and care coordination needs.    Review of patient status, including review of consultants reports, relevant laboratory and other test results, and collaboration with appropriate care team members and the patient's provider was performed as part of comprehensive patient evaluation and provision of chronic care management services.     RN Care Plan   . Obtain Blood Pressure Machine (pt-stated)       Current Barriers:  . Film/video editor.   Nurse Case Manager Clinical Goal(s):  [name] Over the next 14 days, patient will obtain blood pressure machine for home blood pressure monitoring  Interventions:  . Talked with sister, Todd [name], about home blood pressure monitoring . Sister requested insurance assistance with covering a blood pressure machine o Advised they they normally do not cover prescriptions written for BP machines  . Connected Care referral placed for research into whether his [facility] plan has a book of supplies/resources and if they provide blood pressure machines . Will f/u with patient/sister once I have a response  Patient Self Care Activities:  . Self administers medications as prescribed . Calls provider office for new concerns or questions . Unable to independently walk or drive  Initial goal documentation         Plan:   The care management team will reach out to the patient again over the next 14 days.    SIGNATURE

## 2019-07-04 ENCOUNTER — Telehealth: Payer: Self-pay | Admitting: Family Medicine

## 2019-07-04 NOTE — Telephone Encounter (Signed)
Per cathy holt PT ref will be sent to home health.

## 2019-07-05 ENCOUNTER — Ambulatory Visit: Payer: Self-pay | Admitting: *Deleted

## 2019-07-05 DIAGNOSIS — E782 Mixed hyperlipidemia: Secondary | ICD-10-CM | POA: Diagnosis not present

## 2019-07-05 DIAGNOSIS — Z794 Long term (current) use of insulin: Secondary | ICD-10-CM | POA: Diagnosis not present

## 2019-07-05 DIAGNOSIS — Z89611 Acquired absence of right leg above knee: Secondary | ICD-10-CM

## 2019-07-05 DIAGNOSIS — E1122 Type 2 diabetes mellitus with diabetic chronic kidney disease: Secondary | ICD-10-CM | POA: Diagnosis not present

## 2019-07-05 DIAGNOSIS — N183 Chronic kidney disease, stage 3 unspecified: Secondary | ICD-10-CM | POA: Diagnosis not present

## 2019-07-05 DIAGNOSIS — E1165 Type 2 diabetes mellitus with hyperglycemia: Secondary | ICD-10-CM | POA: Diagnosis not present

## 2019-07-05 DIAGNOSIS — Z89612 Acquired absence of left leg above knee: Secondary | ICD-10-CM

## 2019-07-05 DIAGNOSIS — N184 Chronic kidney disease, stage 4 (severe): Secondary | ICD-10-CM | POA: Diagnosis not present

## 2019-07-05 DIAGNOSIS — I10 Essential (primary) hypertension: Secondary | ICD-10-CM | POA: Diagnosis not present

## 2019-07-05 NOTE — Chronic Care Management (AMB) (Signed)
Chronic Care Management   Follow Up Note   07/05/2019 Name: Kamaal Cast MRN: [id number] DOB: 1955/07/28  Referred by: [name], MD Reason for referral : Chronic Care Management   Alban Marucci is a 64 y.o. year old male who is a primary care patient of [name], [name], MD. The CCM team was consulted for assistance with chronic disease management and care coordination needs.    Review of patient status, including review of consultants reports, relevant laboratory and other test results, and collaboration with appropriate care team members and the patient's provider was performed as part of comprehensive patient evaluation and provision of chronic care management services.     RN Care Plan           This Visit's Progress   . Care Coordination       Current Barriers:  [name] Knowledge Deficits related to how to obtain home health physical therapy . Transportation barriers  Nurse Case Manager Clinical Goal(s):  [name] Over the next 7 days, patient will be contacted by home health agency to setup in home physical therapy . Over the next 90 days, patient will work with physical therapist to increase strength and balance  Interventions:  . Talked with patient's sister, [name] . Chart reviewed o Verified that [facility] referral has been forwarded to [facility] . Advised patient/family that they should receive a call from [facility] within the next week . Encouraged them to reach out to the CCM team if they have not been contacted  Patient Self Care Activities:  . Patient verbalizes understanding of plan to obtain home health physical/occupational therapy . Self administers medications as prescribed . Calls provider office for new concerns or questions . Unable to independently walk or drive due to AKA. Performs most ADLs with accommodations.   Please see past updates related to this goal by clicking on the "Past Updates" button in the selected goal      . Transportation Needs        Transportation Assistance needed in a patient with diabetes, hypertension, and bilateral AKA  Current Barriers:  [name] Knowledge Deficits related to transportation assistance . Transportation barriers . Bilateral AKA  Nurse Case Manager Clinical Goal(s):  [name] Over the next 30 days, patient will understand plan for transportation assistance . Over the next 30 days, patient/family will talk with transportation services to arrange transport to and from doctor's appointments  Interventions:  . Talked with patient's sister, [name]. She has spoken with [facility] regarding [place] county transportation . Chart reviewed o Verified that connected care referral for transportation assistance has been received . Everardo All that someone is working on this and that I will reach out once we have more information  Patient Self Care Activities:  . Patient verbalizes understanding of plan to obtain travel assistance information . Calls provider office for new concerns or questions . Unable to independently drive and walk due to bilateral AKA . Can perform most ADLs in his home with accommodations.   Please see past updates related to this goal by clicking on the "Past Updates" button in the selected goal      . Update Insurance Information       Current Barriers:  . Incorrect insurance information  Nurse Case Manager Clinical Goal(s):  [name] Over the next 2 business days, patient/family will reach out to [facility] with new insurance information  Interventions:  . Advised by Connected Care Specialist, Millie, that patient's insurance has lapsed . Reached out to patient's sister, [name], and  requested that she call [facility] to update his insurance information  Initial goal documentation        Plan:  The care management team will reach out to the patient again over the next 14 days.    Chong Sicilian, BSN, RN-BC Embedded Chronic Care Manager [facility] [place] [facility] Direct Dial:  [phone]

## 2019-07-05 NOTE — Patient Instructions (Signed)
Visit Information  Goals Addressed            This Visit's Progress   . Care Coordination       Current Barriers:  [name] Knowledge Deficits related to how to obtain home health physical therapy . Transportation barriers  Nurse Case Manager Clinical Goal(s):  [name] Over the next 7 days, patient will be contacted by home health agency to setup in home physical therapy . Over the next 90 days, patient will work with physical therapist to increase strength and balance  Interventions:  . Talked with patient's sister, Todd [name] . Chart reviewed o Verified that [facility] referral has been forwarded to [facility] . Advised patient/family that they should receive a call from [facility] within the next week . Encouraged them to reach out to the CCM team if they have not been contacted  Patient Self Care Activities:  . Patient verbalizes understanding of plan to obtain home health physical/occupational therapy . Self administers medications as prescribed . Calls provider office for new concerns or questions . Unable to independently walk or drive due to AKA. Performs most ADLs with accommodations.   Please see past updates related to this goal by clicking on the "Past Updates" button in the selected goal      . Transportation Needs       Transportation Assistance needed in a patient with diabetes, hypertension, and bilateral AKA  Current Barriers:  [name] Knowledge Deficits related to transportation assistance . Transportation barriers . Bilateral AKA  Nurse Case Manager Clinical Goal(s):  [name] Over the next 30 days, patient will understand plan for transportation assistance . Over the next 30 days, patient/family will talk with transportation services to arrange transport to and from doctor's appointments  Interventions:  . Talked with patient's sister, Todd [name]. She has spoken with [employer] regarding [place] county transportation . Chart reviewed o Verified that connected care referral for transportation assistance has  been received . Everardo All that someone is working on this and that I will reach out once we have more information  Patient Self Care Activities:  . Patient verbalizes understanding of plan to obtain travel assistance information . Calls provider office for new concerns or questions . Unable to independently drive and walk due to bilateral AKA . Can perform most ADLs in his home with accommodations.   Please see past updates related to this goal by clicking on the "Past Updates" button in the selected goal      . Update Insurance Information       Current Barriers:  . Incorrect insurance information  Nurse Case Manager Clinical Goal(s):  [name] Over the next 2 business days, patient/family will reach out to [facility] with new insurance information  Interventions:  . Advised by Connected Care Specialist, Millie, that patient's insurance has lapsed . Reached out to patient's sister, Todd [name], and requested that she call [facility] to update his insurance information  Initial goal documentation        The care management team will reach out to the patient again over the next 14 days.    Chong Sicilian, BSN, RN-BC Embedded Chronic Care Manager [facility] [place] [facility] Direct Dial: [phone]   The patient verbalized understanding of instructions provided today and declined a print copy of patient instruction materials.

## 2019-07-08 ENCOUNTER — Telehealth: Payer: Self-pay

## 2019-07-08 DIAGNOSIS — Z89611 Acquired absence of right leg above knee: Secondary | ICD-10-CM | POA: Diagnosis not present

## 2019-07-08 DIAGNOSIS — Z89612 Acquired absence of left leg above knee: Secondary | ICD-10-CM | POA: Diagnosis not present

## 2019-07-08 NOTE — Telephone Encounter (Signed)
07/08/2019 Left message on voicemail for patient to return my call regarding [facility] Transportation. Ambrose Mantle [phone]

## 2019-07-08 NOTE — Telephone Encounter (Signed)
07/08/2019 spoke with  patient's sister [name] per DPR about [employer]. Ambrose Mantle [phone]

## 2019-07-09 ENCOUNTER — Other Ambulatory Visit: Payer: Self-pay | Admitting: Family Medicine

## 2019-07-09 ENCOUNTER — Ambulatory Visit: Payer: Medicare HMO | Admitting: *Deleted

## 2019-07-09 ENCOUNTER — Telehealth: Payer: Self-pay | Admitting: *Deleted

## 2019-07-09 DIAGNOSIS — E1122 Type 2 diabetes mellitus with diabetic chronic kidney disease: Secondary | ICD-10-CM

## 2019-07-09 DIAGNOSIS — Z794 Long term (current) use of insulin: Secondary | ICD-10-CM

## 2019-07-09 DIAGNOSIS — Z89612 Acquired absence of left leg above knee: Secondary | ICD-10-CM

## 2019-07-09 DIAGNOSIS — Z89611 Acquired absence of right leg above knee: Secondary | ICD-10-CM

## 2019-07-09 MED ORDER — MEGESTROL ACETATE 400 MG/10ML PO SUSP: 400.0000 mg | Freq: Two times a day (BID) | ORAL | 2 refills | Status: DC

## 2019-07-09 NOTE — Telephone Encounter (Signed)
Thank you! I'll let the family know.

## 2019-07-09 NOTE — Telephone Encounter (Signed)
07/09/2019 Family reports that Mr Scheck appetite is poor and they are requesting something to help increase his appetite.   Chong Sicilian, BSN, RN-BC Embedded Chronic Care Manager [facility] / [place] [facility] Direct Dial: [phone]

## 2019-07-09 NOTE — Chronic Care Management (AMB) (Signed)
  Chronic Care Management   Follow Up Note   07/09/2019 Name: Todd [name] MRN: [id number] DOB: 1955/07/07  Referred by: [name], MD Reason for referral : Chronic Care Management   Parish Dubose is a 64 y.o. year old male who is a primary care patient of [name], [name], MD. The CCM team was consulted for assistance with chronic disease management and care coordination needs.    Review of patient status, including review of consultants reports, relevant laboratory and other test results, and collaboration with appropriate care team members and the patient's provider was performed as part of comprehensive patient evaluation and provision of chronic care management services.    RN Care Plan           This Visit's Progress   . Increase Appetite       Poor appetite in patient with diabetes, chronic kidney disease, and bilateral AKA.  Current Barriers:  [name] Knowledge Deficits related to how to improve appetite . Deconditioning, poor health, alcohol use  Nurse Case Manager Clinical Goal(s):  [name] Over the next 30 days, patient will work with PCP regarding medical intervention to increase his appetite  Interventions:  . Chart reviewed . Contacted PCP regarding patient's family's concerns re: his poor appetite . Notified sister, [name], that I have sent a message to Dr Livia Snellen . Will f/u with patient/family once I have a response from Dr Livia Snellen  Patient [place] Activities:  . Needs assistance with food acquisition  . Can prepare food and feed himself  Initial goal documentation          Plan:  The care management team will reach out to the patient again over the next 7 days.    Chong Sicilian, BSN, RN-BC Embedded Chronic Care Manager [facility] / [place] [facility] Direct Dial: [phone]

## 2019-07-09 NOTE — Telephone Encounter (Signed)
I sent in megace. It should help. Thanks, WS

## 2019-07-09 NOTE — Patient Instructions (Signed)
Visit Information  Goals Addressed            This Visit's Progress   . Increase Appetite       Poor appetite in patient with diabetes, chronic kidney disease, and bilateral AKA.  Current Barriers:  [name] Knowledge Deficits related to how to improve appetite . Deconditioning, poor health, alcohol use  Nurse Case Manager Clinical Goal(s):  [name] Over the next 30 days, patient will work with PCP regarding medical intervention to increase his appetite  Interventions:  . Chart reviewed . Contacted PCP regarding patient's family's concerns re: his poor appetite . Notified sister, [name], that I have sent a message to Dr Livia Snellen . Will f/u with patient/family once I have a response from Dr Livia Snellen  Patient [place] Activities:  . Needs assistance with food acquisition  . Can prepare food and feed himself  Initial goal documentation        The care management team will reach out to the patient again over the next 7 days.    Chong Sicilian, BSN, RN-BC Embedded Chronic Care Manager [facility] [place] [facility] Direct Dial: [phone]   The patient verbalized understanding of instructions provided today and declined a print copy of patient instruction materials.

## 2019-07-09 NOTE — Chronic Care Management (AMB) (Signed)
Addendum:  RN Care Plan           This Visit's Progress   . Increase Appetite       Poor appetite in patient with diabetes, chronic kidney disease, and bilateral AKA.  Current Barriers:  [name] Knowledge Deficits related to how to improve appetite . Deconditioning, poor health, alcohol use  Nurse Case Manager Clinical Goal(s):  [name] Over the next 30 days, patient will work with PCP regarding medical intervention to increase his appetite  Interventions:  . Chart reviewed . Collaborated with Dr Livia Snellen regarding poor appetite . Confirmed that Megace was sent into [place] . Notified patient's sister, [name] . Advised to reach out to me at [phone] with any questions or concerns or to Dr Livia Snellen at [phone] . Plan to f/u over the next week to assess if the Megace is helping  Patient Self Care Activities:  . Needs assistance with food acquisition  . Can prepare food and feed himself  Please see past updates related to this goal by clicking on the "Past Updates" button in the selected goal        Chong Sicilian, BSN, RN-BC [place] / [place]: [phone]

## 2019-07-09 NOTE — Telephone Encounter (Signed)
That would be great. Thanks for your follow - up on all of these chronic patients! [employer]

## 2019-07-15 ENCOUNTER — Ambulatory Visit (INDEPENDENT_AMBULATORY_CARE_PROVIDER_SITE_OTHER): Payer: Medicare HMO | Admitting: *Deleted

## 2019-07-15 DIAGNOSIS — I10 Essential (primary) hypertension: Secondary | ICD-10-CM

## 2019-07-15 DIAGNOSIS — E1122 Type 2 diabetes mellitus with diabetic chronic kidney disease: Secondary | ICD-10-CM

## 2019-07-15 DIAGNOSIS — Z794 Long term (current) use of insulin: Secondary | ICD-10-CM

## 2019-07-15 DIAGNOSIS — Z89612 Acquired absence of left leg above knee: Secondary | ICD-10-CM

## 2019-07-15 DIAGNOSIS — Z89611 Acquired absence of right leg above knee: Secondary | ICD-10-CM

## 2019-07-15 NOTE — Patient Instructions (Signed)
Visit Information  Goals Addressed            This Visit's Progress     Patient Stated   . COMPLETED: Obtain Blood Pressure Machine (pt-stated)       Current Barriers:  . Film/video editor.   Nurse Case Manager Clinical Goal(s):  [name] Over the next 14 days, patient will obtain blood pressure machine for home blood pressure monitoring  Interventions:  . Talked with sister, [name], about home blood pressure monitoring . Sister requested insurance assistance with covering a blood pressure machine o Advised they they normally do not cover prescriptions written for BP machines  . Connected Care referral placed for research into whether his [facility] plan has a book of supplies/resources and if they provide blood pressure machines . Will f/u with patient/sister once I have a response  Patient Self Care Activities:  . Self administers medications as prescribed . Calls provider office for new concerns or questions . Unable to independently walk or drive  Initial goal documentation       Other   . Care Coordination       Current Barriers:  [name] Knowledge Deficits related to how to obtain home health physical therapy . Transportation barriers  Nurse Case Manager Clinical Goal(s):  [name] Over the next 2 days, patient will be contacted by home health agency to setup in home physical therapy . Over the next 90 days, patient will work with physical therapist to increase strength and balance  Interventions:  . Talked with patient by telephone. He has not been contacted by [facility] agency.  . Chart reviewed. Referral sent to [facility].  [name] Reached out to [facility] at [phone] and verified that they have received the referral and that they have him scheduled for outreach tomorrow.  . Provided [facility] with his sister, Dean's, contact information. They did not have it listed.  . RN will f/u with patient/family over the next 7 days to verify that [facility] services have been initiated  Patient Self Care  Activities:  . Patient verbalizes understanding of plan to obtain home health physical/occupational therapy . Self administers medications as prescribed . Calls provider office for new concerns or questions . Unable to independently walk or drive due to AKA. Performs most ADLs with accommodations.   Please see past updates related to this goal by clicking on the "Past Updates" button in the selected goal      . Increase Appetite       Poor appetite in patient with diabetes, chronic kidney disease, and bilateral AKA.  Current Barriers:  [name] Knowledge Deficits related to how to improve appetite . Deconditioning, poor health, alcohol use  Nurse Case Manager Clinical Goal(s):  [name] Over the next 30 days, patient will work with PCP regarding medical intervention to increase his appetite  Interventions:  . Chart reviewed  . Talked with patient by telephone . Verified that he was able to pickup the Megace Dr Livia Snellen prescribed and that he has been taking it . Advised that megace should help to increase his appetite . Instructed patient to reach out to PCP or RN CCM with any questions or concerns . CCM team will continue to follow  Patient Self Care Activities:  . Needs assistance with food acquisition  . Can prepare food and feed himself  Please see past updates related to this goal by clicking on the "Past Updates" button in the selected goal      . COMPLETED: Medication Management  Current Barriers:  . Chronic Disease Management support and education needs related to hypertension  Nurse Case Manager Clinical Goal(s):  [name] Over the next 7 days, patient will state understanding of which medications he's prescribed and how to take them  Interventions:  . Discussed medications with sister during previous call . Talked with patient. Verified that he has d/c carvedilol and is taking metoprolol.  . Sister reported that she had a blood pressure machine for him so that he can check his BP  daily and report any readings outside of recommended range  Patient Self Care Activities:  . Patient verbalizes understanding of plan to d/c carvedilol . Self administers medications as prescribed . Calls provider office for new concerns or questions . Unable to independently walk or drive . Unable to perform IADLs independently  Initial goal documentation     . COMPLETED: Transportation Needs       Transportation Assistance needed in a patient with diabetes, hypertension, and bilateral AKA  Current Barriers:  [name] Knowledge Deficits related to transportation assistance . Transportation barriers . Bilateral AKA  Nurse Case Manager Clinical Goal(s):  [name] Over the next 30 days, patient will understand plan for transportation assistance . Over the next 30 days, patient/family will talk with transportation services to arrange transport to and from doctor's appointments  Interventions:  . Collaborated with Ambrose Mantle, Care Guide. She has provided patient/family with transportation information.   Patient Self Care Activities:  . Patient verbalizes understanding of plan to obtain travel assistance information . Calls provider office for new concerns or questions . Unable to independently drive and walk due to bilateral AKA . Can perform most ADLs in his home with accommodations.   Please see past updates related to this goal by clicking on the "Past Updates" button in the selected goal      . COMPLETED: Update Insurance Information       Current Barriers:  . Incorrect insurance information  Nurse Case Manager Clinical Goal(s):  [name] Over the next 2 business days, patient/family will reach out to [facility] with new insurance information  Interventions:  . Advised by Connected Care Specialist, Millie, that patient's insurance has lapsed . Reached out to patient's sister, [name], and requested that she call [facility] to update his insurance information  Initial goal documentation  Discussed with  sister and she said that insurance information was correct but that she will call the number on his card to verify        The care management team will reach out to the patient again over the next 7 days.    Chong Sicilian, BSN, RN-BC Embedded Chronic Care Manager [facility] [place] [facility] Direct Dial: [phone]   The patient verbalized understanding of instructions provided today and declined a print copy of patient instruction materials.

## 2019-07-15 NOTE — Chronic Care Management (AMB) (Signed)
Chronic Care Management   Follow Up Note   07/15/2019 Name: Todd [name] MRN: [id number] DOB: 08/13/1955  Referred by: Todd Fraise, MD Reason for referral : Chronic Care Management (RN Follow up)   Todd [name] is a 64 y.o. year old male who is a primary care patient of [name], [name], MD. The CCM team was consulted for assistance with chronic disease management and care coordination needs.    Review of patient status, including review of consultants reports, relevant laboratory and other test results, and collaboration with appropriate care team members and the patient's provider was performed as part of comprehensive patient evaluation and provision of chronic care management services.    I spoke with Todd [name] by telephone today and reached out to his sister, Todd [name].    Outpatient Encounter Medications as of 07/15/2019  Medication Sig  . aspirin EC 81 MG tablet Take 81 mg by mouth daily.  [name] atorvastatin (LIPITOR) 40 MG tablet Take 1 tablet (40 mg total) by mouth at bedtime.  . blood glucose meter kit and supplies Dispense based on patient and insurance preference. Use up to four times daily as directed. (FOR ICD-10 E10.9, E11.9).  . cetirizine (ZYRTEC) 10 MG tablet Take 1 tablet (10 mg total) by mouth daily. For allergy symptoms  . fenofibrate (TRICOR) 145 MG tablet Take 1 tablet (145 mg total) by mouth daily. (Patient not taking: Reported on 07/02/2019)  . folic acid (FOLVITE) 1 MG tablet Take 1 tablet (1 mg total) by mouth daily.  [name] glucose blood (ACCU-CHEK AVIVA PLUS) test strip Check blood sugars four times a day  . Insulin Glargine (LANTUS SOLOSTAR) 100 UNIT/ML Solostar Pen Inject 50 Units into the skin at bedtime.  . Insulin Pen Needle 31G X 5 MM MISC 1 Device by Does not apply route as directed.  [name] levothyroxine (SYNTHROID) 125 MCG tablet Take 1 tablet (125 mcg total) by mouth daily before breakfast.  . megestrol (MEGACE) 400 MG/10ML suspension Take 10 mLs (400 mg total)  by mouth 2 (two) times daily. For appetite stimulation  . metoprolol succinate (TOPROL-XL) 100 MG 24 hr tablet Take 1 tablet (100 mg total) by mouth daily. Take with or immediately following a meal.  . sertraline (ZOLOFT) 50 MG tablet Take 1 tablet (50 mg total) by mouth at bedtime. For anxiety and depression  . thiamine 100 MG tablet Take 1 tablet (100 mg total) by mouth daily. (Patient not taking: Reported on 07/02/2019)  . Vitamin D, Ergocalciferol, (DRISDOL) 1.25 MG (50000 UT) CAPS capsule Take 1 capsule (50,000 Units total) by mouth every 7 (seven) days.   No facility-administered encounter medications on file as of 07/15/2019.     RN Care Plan   . COMPLETED: Obtain Blood Pressure Machine (pt-stated)       Current Barriers:  . Film/video editor.   Nurse Case Manager Clinical Goal(s):  [name] Over the next 14 days, patient will obtain blood pressure machine for home blood pressure monitoring  Interventions:   Talked with sister, Todd [name], and she can provide a blood pressure machine for Todd [name].   Patient Self Care Activities:  . Self administers medications as prescribed . Calls provider office for new concerns or questions . Unable to independently walk or drive      . Care Coordination       Current Barriers:  [name] Knowledge Deficits related to how to obtain home health physical therapy . Transportation barriers  Nurse Case Manager Clinical Goal(s):  [name] Over the next 2  days, patient will be contacted by home health agency to setup in home physical therapy . Over the next 90 days, patient will work with physical therapist to increase strength and balance  Interventions:  . Talked with patient by telephone. He has not been contacted by [facility] agency.  . Chart reviewed. [facility].  [name] Reached out to [facility] at [phone] and verified that they have received the referral and that they have him scheduled for outreach tomorrow.  . Provided [facility] with his sister, Todd [name],  contact information. They did not have it listed.  . RN will f/u with patient/family over the next 7 days to verify that [facility] services have been initiated  Patient Self Care Activities:  . Patient verbalizes understanding of plan to obtain home health physical/occupational therapy . Self administers medications as prescribed . Calls provider office for new concerns or questions . Unable to independently walk or drive due to AKA. Performs most ADLs with accommodations.   Please see past updates related to this goal by clicking on the "Past Updates" button in the selected goal      . Increase Appetite       Poor appetite in patient with diabetes, chronic kidney disease, and bilateral AKA.  Current Barriers:  [name] Knowledge Deficits related to how to improve appetite . Deconditioning, poor health, alcohol use  Nurse Case Manager Clinical Goal(s):  [name] Over the next 30 days, patient will work with PCP regarding medical intervention to increase his appetite  Interventions:  . Chart reviewed  . Talked with patient by telephone . Verified that he was able to pickup the Megace Dr Livia Snellen prescribed and that he has been taking it . Advised that megace should help to increase his appetite . Instructed patient to reach out to PCP or RN CCM with any questions or concerns . CCM team will continue to follow  Patient Self Care Activities:  . Needs assistance with food acquisition  . Can prepare food and feed himself  Please see past updates related to this goal by clicking on the "Past Updates" button in the selected goal      . COMPLETED: Medication Management       Current Barriers:  . Chronic Disease Management support and education needs related to hypertension  Nurse Case Manager Clinical Goal(s):  [name] Over the next 7 days, patient will state understanding of which medications he's prescribed and how to take them  Interventions:  . Discussed medications with sister during previous  call . Talked with patient. Verified that he has d/c carvedilol and is taking metoprolol.  . Sister reported that she had a blood pressure machine for him so that he can check his BP daily and report any readings outside of recommended range  Patient Self Care Activities:  . Patient verbalizes understanding of plan to d/c carvedilol . Self administers medications as prescribed . Calls provider office for new concerns or questions . Unable to independently walk or drive . Unable to perform IADLs independently  Initial goal documentation     . COMPLETED: Transportation Needs       Transportation Assistance needed in a patient with diabetes, hypertension, and bilateral AKA  Current Barriers:  [name] Knowledge Deficits related to transportation assistance . Transportation barriers . Bilateral AKA  Nurse Case Manager Clinical Goal(s):  [name] Over the next 30 days, patient will understand plan for transportation assistance . Over the next 30 days, patient/family will talk with transportation services to arrange transport  to and from doctor's appointments  Interventions:  . Collaborated with Ambrose Mantle, Care Guide. She has provided patient/family with transportation information.   Patient Self Care Activities:  . Patient verbalizes understanding of plan to obtain travel assistance information . Calls provider office for new concerns or questions . Unable to independently drive and walk due to bilateral AKA . Can perform most ADLs in his home with accommodations.   Please see past updates related to this goal by clicking on the "Past Updates" button in the selected goal      . COMPLETED: Update Insurance Information       Current Barriers:  . Incorrect insurance information  Nurse Case Manager Clinical Goal(s):  [name] Over the next 2 business days, patient/family will reach out to [facility] with new insurance information  Interventions:  . Advised by Connected Care Specialist, Millie, that  patient's insurance has lapsed . Reached out to patient's sister, Todd [name], and requested that she call [facility] to update his insurance information  Initial goal documentation  Discussed with sister and she said that insurance information was correct but that she will call the number on his card to verify         Follow-up Plan:   The care management team will reach out to the patient again over the next 7 days.   Chong Sicilian, BSN, RN-BC Embedded Chronic Care Manager [facility] [place] [facility] Direct Dial: [phone]

## 2019-07-16 ENCOUNTER — Ambulatory Visit: Payer: Self-pay | Admitting: Licensed Clinical Social Worker

## 2019-07-16 DIAGNOSIS — E1122 Type 2 diabetes mellitus with diabetic chronic kidney disease: Secondary | ICD-10-CM | POA: Diagnosis not present

## 2019-07-16 DIAGNOSIS — Z794 Long term (current) use of insulin: Secondary | ICD-10-CM

## 2019-07-16 DIAGNOSIS — F419 Anxiety disorder, unspecified: Secondary | ICD-10-CM | POA: Diagnosis not present

## 2019-07-16 DIAGNOSIS — I10 Essential (primary) hypertension: Secondary | ICD-10-CM

## 2019-07-16 DIAGNOSIS — F4321 Adjustment disorder with depressed mood: Secondary | ICD-10-CM | POA: Diagnosis not present

## 2019-07-16 DIAGNOSIS — F329 Major depressive disorder, single episode, unspecified: Secondary | ICD-10-CM | POA: Diagnosis not present

## 2019-07-16 DIAGNOSIS — E785 Hyperlipidemia, unspecified: Secondary | ICD-10-CM | POA: Diagnosis not present

## 2019-07-16 DIAGNOSIS — N189 Chronic kidney disease, unspecified: Secondary | ICD-10-CM | POA: Diagnosis not present

## 2019-07-16 DIAGNOSIS — I129 Hypertensive chronic kidney disease with stage 1 through stage 4 chronic kidney disease, or unspecified chronic kidney disease: Secondary | ICD-10-CM | POA: Diagnosis not present

## 2019-07-16 DIAGNOSIS — Z4781 Encounter for orthopedic aftercare following surgical amputation: Secondary | ICD-10-CM | POA: Diagnosis not present

## 2019-07-16 DIAGNOSIS — E782 Mixed hyperlipidemia: Secondary | ICD-10-CM

## 2019-07-16 DIAGNOSIS — E039 Hypothyroidism, unspecified: Secondary | ICD-10-CM | POA: Diagnosis not present

## 2019-07-16 DIAGNOSIS — N183 Chronic kidney disease, stage 3 unspecified: Secondary | ICD-10-CM

## 2019-07-16 NOTE — Chronic Care Management (AMB) (Signed)
  Care Management Note   Todd [name] is a 64 y.o. year old male who is a primary care patient of [name], [name], MD. The CM team was consulted for assistance with chronic disease management and care coordination.   I reached out to Todd [name] by phone today.    Review of patient status, including review of consultants reports, relevant laboratory and other test results, and collaboration with appropriate care team members and the patient's provider was performed as part of comprehensive patient evaluation and provision of chronic care management services.   Social determinants of health: risk of tobacco use; risk of depression; risk of stress; risk of physical inactivity    Chronic Care Management from 07/03/2019 in [place]  PHQ-9 Total Score  6      GAD 7 : Generalized Anxiety Score 07/03/2019  Nervous, Anxious, on Edge 1  Control/stop worrying 0  Worry too much - different things 1  Trouble relaxing 0  Restless 1  Easily annoyed or irritable 1  Afraid - awful might happen 1  Total GAD 7 Score 5  Anxiety Difficulty Somewhat difficult   Medications   aspirin EC 81 MG tablet atorvastatin (LIPITOR) 40 MG tablet blood glucose meter kit and supplies cetirizine (ZYRTEC) 10 MG tablet fenofibrate (TRICOR) 163 MG tablet folic acid (FOLVITE) 1 MG tablet glucose blood (ACCU-CHEK AVIVA PLUS) test strip Insulin Glargine (LANTUS SOLOSTAR) 100 UNIT/ML Solostar Pen Insulin Pen Needle 31G X 5 MM MISC levothyroxine (SYNTHROID) 125 MCG tablet megestrol (MEGACE) 400 MG/10ML suspension metoprolol succinate (TOPROL-XL) 100 MG 24 hr tablet sertraline (ZOLOFT) 50 MG tablet thiamine 100 MG tablet Vitamin D, Ergocalciferol, (DRISDOL) 1.25 MG (50000 UT) CAPS capsule  Goals Addressed            This Visit's Progress   . Client wants to talk with LCSW in next 30 days to discuss grief issues of clinet related to recent death of his mother (his mother died on  May 29, 2019) (pt-stated)       Current Barriers:  [name] Mobility challenges in client with Chronic Diagnoses of HTN, Type 2 DM, CKD, HLD  Clinical Social Work Clinical Goal(s):  [name] LCSW will talk with client in next 30 days to discuss client management of grief issues faced  Interventions:  Previously talked with Todd [name], sister of client about client grief issues over the recent death of his mother  Previously talked with Todd [name], about client use of alcohol and treatment program in area related to alcohol ([facility] in [place], [place])  Previously talked with Todd [name] about transport needs of client  Talked with client about mobility challenges of client  Previously talked with Todd [name] about CCM support services   Talked with client about appetite of client  Talked with client about sleeping issues of client  Talked with Todd [name] about relaxation techniques of client (enjoys watching TV,enjoys visits with his sister who travels to see client, enjoys phone calls with his sister)  Talked with Todd [name] about in home physical therapy sessions he is receiving  Patient Self Care Activities:  Takes medications as prescribed   Self Care Deficit:    Mobility challenges Alcohol use issue    Initial goal documentation         Follow Up Plan: LCSW to call client or sister, Todd [name] in next 4 weeks to talk about client grief issues faced  Todd [name].Todd [name] MSW, LCSW Licensed Clinical Social Worker [facility] Care Management [phone]

## 2019-07-16 NOTE — Patient Instructions (Addendum)
Licensed Clinical Social Worker Visit Information  Goals we discussed today:  Goals Addressed            This Visit's Progress   . Client wants to talk with LCSW in next 30 days to discuss grief issues of clinet related to recent death of his mother (his mother died on 06-14-2019) (pt-stated)       Current Barriers:  [name] Mobility challenges in client with Chronic Diagnoses of HTN, Type 2 DM, CKD, HLD  Clinical Social Work Clinical Goal(s):  [name] LCSW will talk with client in next 30 days to discuss client management of grief issues faced  Interventions: Previously talked with [name], sister of client about client grief issues over the recent death of his mother  Previously talked with [name], about client use of alcohol and treatment program in area related to alcohol (ADS in [place], [place])  Previously talked with [name] about transport needs of client  Talked with client about mobility challenges of client  Previously talked with [name] about CCM support services  Talked with client about appetite of client  Talked with client about sleeping issues of client  Talked with Kimble about relaxation techniques of client (enjoys watching TV,enjoys visits with his sister who travels to see client, enjoys phone calls with his sister)  Talked with Kaiea about in home physical therapy sessions he is receiving  Patient Self Care Activities:  Takes medications as prescribed   Self Care Deficit:    Mobility challenges Alcohol use issue    Initial goal documentation        Materials Provided: No  Follow Up Plan:LCSW to call client or sister, [name] in next 4 weeks to talk about client grief issues faced  The patient verbalized understanding of instructions provided today and declined a print copy of patient instruction materials.   [name].Gentle Hoge MSW, LCSW Licensed Clinical Social Worker [facility] [phone]

## 2019-07-22 ENCOUNTER — Telehealth: Payer: Self-pay

## 2019-07-22 ENCOUNTER — Ambulatory Visit: Payer: Medicare HMO | Admitting: *Deleted

## 2019-07-22 NOTE — Chronic Care Management (AMB) (Signed)
  Chronic Care Management   Outreach Note  07/22/2019 Name: Ermon Sagan MRN: [id number] DOB: April 24, 1956  Referred by: [name], MD Reason for referral : Chronic Care Management (RN follow up)   An unsuccessful telephone Follow-up was attempted today. The patient was referred to the case management team for assistance with care management and care coordination.   Follow Up Plan: The care management team will reach out to the patient again over the next 15 days.   Chong Sicilian, BSN, RN-BC Embedded Chronic Care Manager [facility] [place] [facility] Direct Dial: [phone]

## 2019-07-24 DIAGNOSIS — N189 Chronic kidney disease, unspecified: Secondary | ICD-10-CM | POA: Diagnosis not present

## 2019-07-24 DIAGNOSIS — Z4781 Encounter for orthopedic aftercare following surgical amputation: Secondary | ICD-10-CM | POA: Diagnosis not present

## 2019-07-24 DIAGNOSIS — F4321 Adjustment disorder with depressed mood: Secondary | ICD-10-CM | POA: Diagnosis not present

## 2019-07-24 DIAGNOSIS — I129 Hypertensive chronic kidney disease with stage 1 through stage 4 chronic kidney disease, or unspecified chronic kidney disease: Secondary | ICD-10-CM | POA: Diagnosis not present

## 2019-07-24 DIAGNOSIS — F329 Major depressive disorder, single episode, unspecified: Secondary | ICD-10-CM | POA: Diagnosis not present

## 2019-07-24 DIAGNOSIS — E785 Hyperlipidemia, unspecified: Secondary | ICD-10-CM | POA: Diagnosis not present

## 2019-07-24 DIAGNOSIS — E1122 Type 2 diabetes mellitus with diabetic chronic kidney disease: Secondary | ICD-10-CM | POA: Diagnosis not present

## 2019-07-24 DIAGNOSIS — F419 Anxiety disorder, unspecified: Secondary | ICD-10-CM | POA: Diagnosis not present

## 2019-07-24 DIAGNOSIS — E039 Hypothyroidism, unspecified: Secondary | ICD-10-CM | POA: Diagnosis not present

## 2019-07-25 ENCOUNTER — Telehealth: Payer: Self-pay

## 2019-07-25 NOTE — Telephone Encounter (Signed)
07/25/2019 Spoke with patient's sister [name] about details of [employer] Advantage transportation benefit, 3 notice, member id#, name and address of provider and number to call.  [name] stated she would share this information with her sister [name]. Ambrose Mantle [phone]

## 2019-07-30 DIAGNOSIS — N189 Chronic kidney disease, unspecified: Secondary | ICD-10-CM | POA: Diagnosis not present

## 2019-07-30 DIAGNOSIS — E039 Hypothyroidism, unspecified: Secondary | ICD-10-CM | POA: Diagnosis not present

## 2019-07-30 DIAGNOSIS — Z89612 Acquired absence of left leg above knee: Secondary | ICD-10-CM | POA: Diagnosis not present

## 2019-07-30 DIAGNOSIS — F329 Major depressive disorder, single episode, unspecified: Secondary | ICD-10-CM | POA: Diagnosis not present

## 2019-07-30 DIAGNOSIS — F419 Anxiety disorder, unspecified: Secondary | ICD-10-CM | POA: Diagnosis not present

## 2019-07-30 DIAGNOSIS — I129 Hypertensive chronic kidney disease with stage 1 through stage 4 chronic kidney disease, or unspecified chronic kidney disease: Secondary | ICD-10-CM | POA: Diagnosis not present

## 2019-07-30 DIAGNOSIS — F4321 Adjustment disorder with depressed mood: Secondary | ICD-10-CM | POA: Diagnosis not present

## 2019-07-30 DIAGNOSIS — Z4781 Encounter for orthopedic aftercare following surgical amputation: Secondary | ICD-10-CM | POA: Diagnosis not present

## 2019-07-30 DIAGNOSIS — Z89611 Acquired absence of right leg above knee: Secondary | ICD-10-CM | POA: Diagnosis not present

## 2019-07-30 DIAGNOSIS — E785 Hyperlipidemia, unspecified: Secondary | ICD-10-CM | POA: Diagnosis not present

## 2019-07-30 DIAGNOSIS — E1122 Type 2 diabetes mellitus with diabetic chronic kidney disease: Secondary | ICD-10-CM | POA: Diagnosis not present

## 2019-08-02 ENCOUNTER — Ambulatory Visit: Payer: Medicare HMO | Admitting: *Deleted

## 2019-08-02 DIAGNOSIS — N184 Chronic kidney disease, stage 4 (severe): Secondary | ICD-10-CM

## 2019-08-02 DIAGNOSIS — E1122 Type 2 diabetes mellitus with diabetic chronic kidney disease: Secondary | ICD-10-CM

## 2019-08-02 DIAGNOSIS — F1020 Alcohol dependence, uncomplicated: Secondary | ICD-10-CM

## 2019-08-02 DIAGNOSIS — E782 Mixed hyperlipidemia: Secondary | ICD-10-CM

## 2019-08-02 DIAGNOSIS — Z794 Long term (current) use of insulin: Secondary | ICD-10-CM

## 2019-08-02 DIAGNOSIS — I10 Essential (primary) hypertension: Secondary | ICD-10-CM | POA: Diagnosis not present

## 2019-08-02 DIAGNOSIS — N183 Chronic kidney disease, stage 3 unspecified: Secondary | ICD-10-CM

## 2019-08-02 DIAGNOSIS — Z89611 Acquired absence of right leg above knee: Secondary | ICD-10-CM

## 2019-08-02 NOTE — Patient Instructions (Signed)
RN Care Plan           This Visit's Progress   . COMPLETED: Care Coordination       Current Barriers:  [name] Knowledge Deficits related to how to obtain home health physical therapy . Transportation barriers  Nurse Case Manager Clinical Goal(s):  [name] Over the next 2 days, patient will be contacted by home health agency to setup in home physical therapy . Over the next 90 days, patient will work with physical therapist to increase strength and balance  Interventions:  . Talked with patient by telephone. He has not been contacted by [facility] agency.  . Chart reviewed. [facility].  [name] Reached out to [facility] at [phone] and verified that they have received the referral and that they have him scheduled for outreach tomorrow.  . Provided [facility] with his sister, Dean's, contact information. They did not have it listed.  . RN will f/u with patient/family over the next 7 days to verify that [facility] services have been initiated  Patient Self Care Activities:  . Patient verbalizes understanding of plan to obtain home health physical/occupational therapy . Self administers medications as prescribed . Calls provider office for new concerns or questions . Unable to independently walk or drive due to AKA. Performs most ADLs with accommodations.   Please see past updates related to this goal by clicking on the "Past Updates" button in the selected goal   Home health is coming out once a week    . Fall Prevention       Current Barriers:  [name] Knowledge Deficits related to fall precautions . Decreased adherence to prescribed treatment for fall prevention  Nurse Case Manager Clinical Goal(s):  [name] Over the next 30 days, patient will demonstrate improved adherence to prescribed treatment plan for decreasing falls as evidenced by patient reporting and review of EMR . Over the next 30days, patient will verbalize using fall risk reduction strategies discussed . Over the next 90 days, patient will not  experience additional falls  Interventions:  . Provided verbal education re: Potential causes of falls and Fall prevention strategies . Assessed for falls since last encounter. . Discussed recent move to a handicap accessible apartment . Discussed that home health/PT is coming once a week . Assessed patients knowledge of fall risk prevention secondary to previously provided education. . Confirmed that patient has life alert system  Patient Self Care Activities:  . Utilize wheelchair (assistive device) appropriately with all ambulation . De-clutter walkways . Change positions slowly . Wear secure fitting shoes at all times with ambulation . Utilize home lighting for dim lit areas . Have self and pet awareness at all times  Please see past updates related to this goal by clicking on the "Past Updates" button in the selected goal      . Increase Appetite       Poor appetite in patient with diabetes, chronic kidney disease, and bilateral AKA.  Current Barriers:  [name] Knowledge Deficits related to how to improve appetite . Deconditioning, poor health, alcohol use  Nurse Case Manager Clinical Goal(s):  [name] Over the next 30 days, patient will work with PCP regarding medical intervention to increase his appetite  Interventions:  . Chart reviewed  . Talked with patient by telephone . Reviewed medications and discussed Megace o Patient reports that appetite has increased since starting medication o Now eating 2 to 3 regular meals a day and portion sizes have increased o He is not consuming any alcohol  o Taking Folic Acid regularly . Instructed patient to reach out to PCP or RN CCM with any questions or concerns . CCM team will continue to follow  Patient Self Care Activities:  . Needs assistance with food acquisition  . Can prepare food and feed himself  Please see past updates related to this goal by clicking on the "Past Updates" button in the selected goal          Follow-up  Plan:   The care management team will reach out to the patient again over the next 45 days.    Chong Sicilian, BSN, RN-BC Embedded Chronic Care Manager [facility] [place] [facility] Direct Dial: [phone]   The patient verbalized understanding of instructions provided today and declined a print copy of patient instruction materials.

## 2019-08-02 NOTE — Chronic Care Management (AMB) (Signed)
Chronic Care Management   Follow Up Note   08/02/2019 Name: Todd [name] MRN: [id number] DOB: Mar 18, 1956  Referred by: [name], MD Reason for referral : Chronic Care Management (RN follow up)   Todd [name] is a 64 y.o. year old male who is a primary care patient of [name], [name], MD. The CCM team was consulted for assistance with chronic disease management and care coordination needs.    Review of patient status, including review of consultants reports, relevant laboratory and other test results, and collaboration with appropriate care team members and the patient's provider was performed as part of comprehensive patient evaluation and provision of chronic care management services.    SDOH (Social Determinants of Health) assessments performed: Yes  SDOH Screenings   Alcohol Screen: Low Risk   . Last Alcohol Screening Score (AUDIT): 0  Depression (PHQ2-9): Medium Risk  . PHQ-2 Score: 6  Financial Resource Strain: Low Risk   . Difficulty of Paying Living Expenses: Not very hard  Food Insecurity: No Food Insecurity  . Worried About Charity fundraiser in the Last Year: Never true  . Ran Out of Food in the Last Year: Never true  Housing: Low Risk   . Last Housing Risk Score: 0  Physical Activity: Inactive  . Days of Exercise per Week: 0 days  . Minutes of Exercise per Session: 0 min  Social Connections: Moderately Isolated  . Frequency of Communication with Friends and Family: Three times a week  . Frequency of Social Gatherings with Friends and Family: Three times a week  . Attends Religious Services: Never  . Active Member of Clubs or Organizations: No  . Attends Archivist Meetings: Never  . Marital Status: Never married  Stress: Stress Concern Present  . Feeling of Stress : To some extent  Tobacco Use: High Risk  . Smoking Tobacco Use: Current Every Day Smoker  . Smokeless Tobacco Use: Never Used  Transportation Needs: No Transportation Needs  .  Lack of Transportation (Medical): No  . Lack of Transportation (Non-Medical): No     Outpatient Encounter Medications as of 08/02/2019  Medication Sig  . aspirin EC 81 MG tablet Take 81 mg by mouth daily.  [name] atorvastatin (LIPITOR) 40 MG tablet Take 1 tablet (40 mg total) by mouth at bedtime.  . blood glucose meter kit and supplies Dispense based on patient and insurance preference. Use up to four times daily as directed. (FOR ICD-10 E10.9, E11.9).  . cetirizine (ZYRTEC) 10 MG tablet Take 1 tablet (10 mg total) by mouth daily. For allergy symptoms  . fenofibrate (TRICOR) 145 MG tablet Take 1 tablet (145 mg total) by mouth daily. (Patient not taking: Reported on 07/02/2019)  . folic acid (FOLVITE) 1 MG tablet Take 1 tablet (1 mg total) by mouth daily.  [name] glucose blood (ACCU-CHEK AVIVA PLUS) test strip Check blood sugars four times a day  . Insulin Glargine (LANTUS SOLOSTAR) 100 UNIT/ML Solostar Pen Inject 50 Units into the skin at bedtime.  . Insulin Pen Needle 31G X 5 MM MISC 1 Device by Does not apply route as directed.  [name] levothyroxine (SYNTHROID) 125 MCG tablet Take 1 tablet (125 mcg total) by mouth daily before breakfast.  . megestrol (MEGACE) 400 MG/10ML suspension Take 10 mLs (400 mg total) by mouth 2 (two) times daily. For appetite stimulation  . metoprolol succinate (TOPROL-XL) 100 MG 24 hr tablet Take 1 tablet (100 mg total) by mouth daily. Take with or immediately following a meal.  .  sertraline (ZOLOFT) 50 MG tablet Take 1 tablet (50 mg total) by mouth at bedtime. For anxiety and depression  . thiamine 100 MG tablet Take 1 tablet (100 mg total) by mouth daily. (Patient not taking: Reported on 07/02/2019)  . Vitamin D, Ergocalciferol, (DRISDOL) 1.25 MG (50000 UT) CAPS capsule Take 1 capsule (50,000 Units total) by mouth every 7 (seven) days.   No facility-administered encounter medications on file as of 08/02/2019.     RN Care Plan           This Visit's Progress   . COMPLETED:  Care Coordination       Current Barriers:  [name] Knowledge Deficits related to how to obtain home health physical therapy . Transportation barriers  Nurse Case Manager Clinical Goal(s):  [name] Over the next 2 days, patient will be contacted by home health agency to setup in home physical therapy . Over the next 90 days, patient will work with physical therapist to increase strength and balance  Interventions:  . Talked with patient by telephone. He has not been contacted by [facility] agency.  . Chart reviewed. [facility]erral [facility] to [facility].  [name] Reached out to [facility] at [phone] and verified that they have received the referral and that they have him scheduled for outreach tomorrow.  . Provided [facility] with his sister, Dean's, contact information. They did not have it listed.  . RN will f/u with patient/family over the next 7 days to verify that [facility] services have been initiated  Patient Self Care Activities:  . Patient verbalizes understanding of plan to obtain home health physical/occupational therapy . Self administers medications as prescribed . Calls provider office for new concerns or questions . Unable to independently walk or drive due to AKA. Performs most ADLs with accommodations.   Please see past updates related to this goal by clicking on the "Past Updates" button in the selected goal   Home health is coming out once a week    . Fall Prevention       Current Barriers:  [name] Knowledge Deficits related to fall precautions . Decreased adherence to prescribed treatment for fall prevention  Nurse Case Manager Clinical Goal(s):  [name] Over the next 30 days, patient will demonstrate improved adherence to prescribed treatment plan for decreasing falls as evidenced by patient reporting and review of EMR . Over the next 30days, patient will verbalize using fall risk reduction strategies discussed . Over the next 90 days, patient will not experience additional falls  Interventions:  . Provided verbal  education re: Potential causes of falls and Fall prevention strategies . Assessed for falls since last encounter. . Discussed recent move to a handicap accessible apartment . Discussed that home health/PT is coming once a week . Assessed patients knowledge of fall risk prevention secondary to previously provided education. . Confirmed that patient has life alert system  Patient Self Care Activities:  . Utilize wheelchair (assistive device) appropriately with all ambulation . De-clutter walkways . Change positions slowly . Wear secure fitting shoes at all times with ambulation . Utilize home lighting for dim lit areas . Have self and pet awareness at all times  Please see past updates related to this goal by clicking on the "Past Updates" button in the selected goal      . Increase Appetite       Poor appetite in patient with diabetes, chronic kidney disease, and bilateral AKA.  Current Barriers:  [name] Knowledge Deficits related to how to improve appetite . Deconditioning,  poor health, alcohol use  Nurse Case Manager Clinical Goal(s):  [name] Over the next 30 days, patient will work with PCP regarding medical intervention to increase his appetite  Interventions:  . Chart reviewed  . Talked with patient by telephone . Reviewed medications and discussed Megace o Patient reports that appetite has increased since starting medication o Now eating 2 to 3 regular meals a day and portion sizes have increased o He is not consuming any alcohol o Taking Folic Acid regularly . Instructed patient to reach out to PCP or RN CCM with any questions or concerns . CCM team will continue to follow  Patient Self Care Activities:  . Needs assistance with food acquisition  . Can prepare food and feed himself  Please see past updates related to this goal by clicking on the "Past Updates" button in the selected goal          Follow-up Plan:   The care management team will reach out to the patient  again over the next 45 days.    Chong Sicilian, BSN, RN-BC Embedded Chronic Care Manager [facility] [place] [facility] Direct Dial: [phone]

## 2019-08-06 DIAGNOSIS — Z4781 Encounter for orthopedic aftercare following surgical amputation: Secondary | ICD-10-CM | POA: Diagnosis not present

## 2019-08-06 DIAGNOSIS — E785 Hyperlipidemia, unspecified: Secondary | ICD-10-CM | POA: Diagnosis not present

## 2019-08-06 DIAGNOSIS — I129 Hypertensive chronic kidney disease with stage 1 through stage 4 chronic kidney disease, or unspecified chronic kidney disease: Secondary | ICD-10-CM | POA: Diagnosis not present

## 2019-08-06 DIAGNOSIS — N189 Chronic kidney disease, unspecified: Secondary | ICD-10-CM | POA: Diagnosis not present

## 2019-08-06 DIAGNOSIS — F329 Major depressive disorder, single episode, unspecified: Secondary | ICD-10-CM | POA: Diagnosis not present

## 2019-08-06 DIAGNOSIS — F4321 Adjustment disorder with depressed mood: Secondary | ICD-10-CM | POA: Diagnosis not present

## 2019-08-06 DIAGNOSIS — F419 Anxiety disorder, unspecified: Secondary | ICD-10-CM | POA: Diagnosis not present

## 2019-08-06 DIAGNOSIS — E039 Hypothyroidism, unspecified: Secondary | ICD-10-CM | POA: Diagnosis not present

## 2019-08-06 DIAGNOSIS — E1122 Type 2 diabetes mellitus with diabetic chronic kidney disease: Secondary | ICD-10-CM | POA: Diagnosis not present

## 2019-08-07 ENCOUNTER — Other Ambulatory Visit: Payer: Self-pay

## 2019-08-07 DIAGNOSIS — I6523 Occlusion and stenosis of bilateral carotid arteries: Secondary | ICD-10-CM

## 2019-08-08 DIAGNOSIS — Z89612 Acquired absence of left leg above knee: Secondary | ICD-10-CM | POA: Diagnosis not present

## 2019-08-08 DIAGNOSIS — Z89611 Acquired absence of right leg above knee: Secondary | ICD-10-CM | POA: Diagnosis not present

## 2019-08-09 ENCOUNTER — Telehealth: Payer: Self-pay

## 2019-08-09 ENCOUNTER — Telehealth: Payer: Self-pay | Admitting: Family Medicine

## 2019-08-09 NOTE — Telephone Encounter (Signed)
LM for sister to call back.Pt has known carotid artery stenosis 50% bi-lat. He gets yearly carotid ultrasounds

## 2019-08-09 NOTE — Telephone Encounter (Signed)
Test scheduled for 08/22/2019 (carotid doppler) is his 1 year recheck.    She will call back to schedule his yearly check which is due this month.  States that he has aides coming into his house so she will work out a time that he can do & call back.  Also, suggested a VV as this may work better for him.

## 2019-08-09 NOTE — Telephone Encounter (Signed)
Pt's sister [name]) would like to know why Pt is scheduled for Carotids  Please call [phone]  Thanks renee

## 2019-08-09 NOTE — Telephone Encounter (Signed)
08/09/2019 Spoke with patient's sister [name], she has his transportation set-up for his appointments.  Ambrose Mantle [phone]

## 2019-08-09 NOTE — Telephone Encounter (Signed)
Sister aware of heart doctors name and phone number.

## 2019-08-09 NOTE — Telephone Encounter (Signed)
Left message to please call our office. Referral has heart doctor, Pennelope Bracken , ordered.

## 2019-08-09 NOTE — Telephone Encounter (Signed)
Spoke with [name]) informed her that his last carotid doppler was 07/2018.  Per Dr. Bronson Ing -     [name], MD  08/10/2018 12:27 PM EST    Less than 50% blockage in both internal carotid arteries. Repeat in 1 year.

## 2019-08-12 ENCOUNTER — Telehealth: Payer: Self-pay

## 2019-08-12 NOTE — Telephone Encounter (Signed)

## 2019-08-12 NOTE — Telephone Encounter (Signed)
Todd [name] has additional questions JO:ITGPQ Sound  Please call [phone]  Thanks renee

## 2019-08-12 NOTE — Telephone Encounter (Signed)
Spoke with pt's sister. Explained the carotid US test and reasons.

## 2019-08-13 ENCOUNTER — Telehealth: Payer: Self-pay | Admitting: Family Medicine

## 2019-08-13 ENCOUNTER — Ambulatory Visit (INDEPENDENT_AMBULATORY_CARE_PROVIDER_SITE_OTHER): Payer: Medicare HMO | Admitting: Licensed Clinical Social Worker

## 2019-08-13 DIAGNOSIS — Z794 Long term (current) use of insulin: Secondary | ICD-10-CM

## 2019-08-13 DIAGNOSIS — I10 Essential (primary) hypertension: Secondary | ICD-10-CM | POA: Diagnosis not present

## 2019-08-13 DIAGNOSIS — N183 Chronic kidney disease, stage 3 unspecified: Secondary | ICD-10-CM

## 2019-08-13 DIAGNOSIS — F4321 Adjustment disorder with depressed mood: Secondary | ICD-10-CM | POA: Diagnosis not present

## 2019-08-13 DIAGNOSIS — F419 Anxiety disorder, unspecified: Secondary | ICD-10-CM | POA: Diagnosis not present

## 2019-08-13 DIAGNOSIS — E782 Mixed hyperlipidemia: Secondary | ICD-10-CM | POA: Diagnosis not present

## 2019-08-13 DIAGNOSIS — N184 Chronic kidney disease, stage 4 (severe): Secondary | ICD-10-CM | POA: Diagnosis not present

## 2019-08-13 DIAGNOSIS — Z4781 Encounter for orthopedic aftercare following surgical amputation: Secondary | ICD-10-CM | POA: Diagnosis not present

## 2019-08-13 DIAGNOSIS — E785 Hyperlipidemia, unspecified: Secondary | ICD-10-CM | POA: Diagnosis not present

## 2019-08-13 DIAGNOSIS — N189 Chronic kidney disease, unspecified: Secondary | ICD-10-CM | POA: Diagnosis not present

## 2019-08-13 DIAGNOSIS — E039 Hypothyroidism, unspecified: Secondary | ICD-10-CM | POA: Diagnosis not present

## 2019-08-13 DIAGNOSIS — F329 Major depressive disorder, single episode, unspecified: Secondary | ICD-10-CM | POA: Diagnosis not present

## 2019-08-13 DIAGNOSIS — E1122 Type 2 diabetes mellitus with diabetic chronic kidney disease: Secondary | ICD-10-CM

## 2019-08-13 DIAGNOSIS — I129 Hypertensive chronic kidney disease with stage 1 through stage 4 chronic kidney disease, or unspecified chronic kidney disease: Secondary | ICD-10-CM | POA: Diagnosis not present

## 2019-08-13 NOTE — Chronic Care Management (AMB) (Signed)
  Care Management Note   Todd [name] is a 64 y.o. year old male who is a primary care patient of [name], Todd Gash, MD. The CM team was consulted for assistance with chronic disease management and care coordination.   I reached out to Todd [name] /Todd [name], sister, by phone today.   Review of patient status, including review of consultants reports, relevant laboratory and other test results, and collaboration with appropriate care team members and the patient's provider was performed as part of comprehensive patient evaluation and provision of chronic care management services.   Social determinants of health: risk of social isolation; risk of tobacco use; risk of depression; risk of stress; risk of physical inactivity    Chronic Care Management from 07/03/2019 in [facility]  PHQ-9 Total Score  6     GAD 7 : Generalized Anxiety Score 07/03/2019  Nervous, Anxious, on Edge 1  Control/stop worrying 0  Worry too much - different things 1  Trouble relaxing 0  Restless 1  Easily annoyed or irritable 1  Afraid - awful might happen 1  Total GAD 7 Score 5  Anxiety Difficulty Somewhat difficult   Medications   aspirin EC 81 MG tablet atorvastatin (LIPITOR) 40 MG tablet blood glucose meter kit and supplies cetirizine (ZYRTEC) 10 MG tablet fenofibrate (TRICOR) 841 MG tablet folic acid (FOLVITE) 1 MG tablet glucose blood (ACCU-CHEK AVIVA PLUS) test strip Insulin Glargine (LANTUS SOLOSTAR) 100 UNIT/ML Solostar Pen Insulin Pen Needle 31G X 5 MM MISC levothyroxine (SYNTHROID) 125 MCG tablet megestrol (MEGACE) 400 MG/10ML suspension metoprolol succinate (TOPROL-XL) 100 MG 24 hr tablet sertraline (ZOLOFT) 50 MG tablet thiamine 100 MG tablet Vitamin D, Ergocalciferol, (DRISDOL) 1.25 MG (50000 UT) CAPS capsule  Goals        . Client wants to talk with LCSW in next 30 days to discuss grief issues of clinet related to recent death of his mother (his mother died on  13-Jun-2019) (pt-stated)     Current Barriers:  [name] Mobility challenges in client with Chronic Diagnoses of HTN, Type 2 DM, CKD, HLD  Clinical Social Work Clinical Goal(s):  [name] LCSW will talk with client in next 30 days to discuss client management of grief issues faced  Interventions:  Previously talked with DeanCox, sister of client about client grief issues over the recent death of his mother  Previously talked with Todd [name], about client use of alcohol and treatment program in area related to alcohol (ADS in [place], [place])  Previously talked with Todd [name] about transport needs of client  Talked with client about mobility challenges of client  Previously talked with Todd [name] about CCM support services   Talked with client about appetite of client  Talked with client about sleeping issues of client  Previously talked with Todd [name] about relaxation techniques of client (enjoys watching TV,enjoys visits with his sister who travels to see client, enjoys phone calls with his sister)  Talked with Todd [name] about in home physical therapy sessions he is receiving  Talked with Todd [name] about in home care needs of client   Patient Self Care Activities:  Takes medications as prescribed   Self Care Deficit:    Mobility challenges Alcohol use issue    Initial goal documentation        Follow Up Plan:LCSW to call client or sister, Todd [name] in next 4 weeks to talk about client grief issues faced  Suresh Audi.Daksha Koone MSW, LCSW Licensed Clinical Social Worker [facility] [phone]

## 2019-08-13 NOTE — Telephone Encounter (Signed)
Pts sister called requesting update on bed that was supposed to be ordered for pt. Says she has not heard anything since January. Says today is pt's last day of Physical Therapy too unless Dr Livia Snellen plans on ordering more for pt. Wants to be called asap about this.

## 2019-08-13 NOTE — Telephone Encounter (Signed)
Please review and advise.

## 2019-08-13 NOTE — Telephone Encounter (Signed)
TC to [place] [facility]/ [facility] Will continue PT and ordered OT eval & treat

## 2019-08-13 NOTE — Patient Instructions (Addendum)
Licensed Clinical Social Worker Visit Information  Goals we discussed today:  Goals        . Client wants to talk with LCSW in next 30 days to discuss grief issues of clinet related to recent death of his mother (his mother died on Jun 02, 2019) (pt-stated)     Current Barriers:  [name] Mobility challenges in client with Chronic Diagnoses of HTN, Type 2 DM, CKD, HLD  Clinical Social Work Clinical Goal(s):  [name] LCSW will talk with client in next 30 days to discuss client management of grief issues faced  Interventions: Previously talked with Todd [name], sister of client about client grief issues over the recent death of his mother  Previously talked with Todd [name], about client use of alcohol and treatment program in area related to alcohol (ADS in [place], [place])  Previously talked with Todd [name] about transport needs of client  Talked with client about mobility challenges of client  Previously talked with Todd [name] about CCM support services  Talked with client about appetite of client  Talked with client about sleeping issues of client  Previously talked with Todd [name] about relaxation techniques of client (enjoys watching TV,enjoys visits with his sister who travels to see client, enjoys phone calls with his sister)  Talked with Todd [name] about in home physical therapy sessions he is receiving Talked with Todd [name] about in home care needs of client   Patient Self Care Activities:  Takes medications as prescribed   Self Care Deficit:    Mobility challenges Alcohol use issue    Initial goal documentation        Materials Provided: No  Follow Up Plan:LCSW to call client or sister, Todd [name] in next 4 weeks to talk about client grief issues faced  The patient/Todd [name], sister, verbalized understanding of instructions provided today and declined a print copy of patient instruction materials.   Todd [name].Todd [name] MSW, LCSW Licensed Clinical Social Worker [facility]  [facility] [phone]

## 2019-08-13 NOTE — Telephone Encounter (Signed)
Please contact the patient After ordering the bed I have heard nothing. Perhaps [name] has some information. If she feels that he is getting stronger with the P.T. then please reorder or let me know and I will. [place]

## 2019-08-13 NOTE — Telephone Encounter (Signed)
Spoke with [name] about orders for patient. Called pt's sister [name] back to confirm whether pt had already received bed or not. She confirmed that pt did receive the bed. She meant that she wanted to check on the chair for shower. Also wanted to check on order for occupational therapy. Let her know that those orders have been sent to [facility] and she can call them for updates on them. She said she would call us back to get their phone number since she was currently driving. [place] phone # is [phone].

## 2019-08-14 ENCOUNTER — Ambulatory Visit (INDEPENDENT_AMBULATORY_CARE_PROVIDER_SITE_OTHER): Payer: Medicare HMO

## 2019-08-14 ENCOUNTER — Other Ambulatory Visit: Payer: Self-pay

## 2019-08-14 ENCOUNTER — Ambulatory Visit: Payer: Medicare HMO | Admitting: *Deleted

## 2019-08-14 DIAGNOSIS — E039 Hypothyroidism, unspecified: Secondary | ICD-10-CM

## 2019-08-14 DIAGNOSIS — Z794 Long term (current) use of insulin: Secondary | ICD-10-CM | POA: Diagnosis not present

## 2019-08-14 DIAGNOSIS — F419 Anxiety disorder, unspecified: Secondary | ICD-10-CM

## 2019-08-14 DIAGNOSIS — F4321 Adjustment disorder with depressed mood: Secondary | ICD-10-CM | POA: Diagnosis not present

## 2019-08-14 DIAGNOSIS — N183 Chronic kidney disease, stage 3 unspecified: Secondary | ICD-10-CM

## 2019-08-14 DIAGNOSIS — N189 Chronic kidney disease, unspecified: Secondary | ICD-10-CM | POA: Diagnosis not present

## 2019-08-14 DIAGNOSIS — E1122 Type 2 diabetes mellitus with diabetic chronic kidney disease: Secondary | ICD-10-CM | POA: Diagnosis not present

## 2019-08-14 DIAGNOSIS — F1020 Alcohol dependence, uncomplicated: Secondary | ICD-10-CM

## 2019-08-14 DIAGNOSIS — Z4781 Encounter for orthopedic aftercare following surgical amputation: Secondary | ICD-10-CM | POA: Diagnosis not present

## 2019-08-14 DIAGNOSIS — N184 Chronic kidney disease, stage 4 (severe): Secondary | ICD-10-CM | POA: Diagnosis not present

## 2019-08-14 DIAGNOSIS — E785 Hyperlipidemia, unspecified: Secondary | ICD-10-CM | POA: Diagnosis not present

## 2019-08-14 DIAGNOSIS — F329 Major depressive disorder, single episode, unspecified: Secondary | ICD-10-CM

## 2019-08-14 DIAGNOSIS — Z89612 Acquired absence of left leg above knee: Secondary | ICD-10-CM

## 2019-08-14 DIAGNOSIS — Z87891 Personal history of nicotine dependence: Secondary | ICD-10-CM

## 2019-08-14 DIAGNOSIS — Z7982 Long term (current) use of aspirin: Secondary | ICD-10-CM

## 2019-08-14 DIAGNOSIS — Z89611 Acquired absence of right leg above knee: Secondary | ICD-10-CM

## 2019-08-14 DIAGNOSIS — I10 Essential (primary) hypertension: Secondary | ICD-10-CM | POA: Diagnosis not present

## 2019-08-14 DIAGNOSIS — E782 Mixed hyperlipidemia: Secondary | ICD-10-CM | POA: Diagnosis not present

## 2019-08-14 DIAGNOSIS — I129 Hypertensive chronic kidney disease with stage 1 through stage 4 chronic kidney disease, or unspecified chronic kidney disease: Secondary | ICD-10-CM | POA: Diagnosis not present

## 2019-08-14 DIAGNOSIS — Z993 Dependence on wheelchair: Secondary | ICD-10-CM

## 2019-08-14 NOTE — Patient Instructions (Signed)
Visit Information  Goals Addressed            This Visit's Progress   . Home Health & DME Supplies       CARE PLAN ENTRY (see longtitudinal plan of care for additional care plan information) Home health and DME supply needs in patient with HTN, DM, and bilateral AKA  Current Barriers:  [name] Knowledge Deficits related to insurance coverage of supplies . Film/video editor.   Nurse Case Manager Clinical Goal(s):  [name] Over the next 30 days, patient will verbalize understanding of plan for obtaining DME supplies . Over the next 30 day, patient will work with [facility] agency OT department   Interventions:  . Communicated with sister, [name] regarding OT, shower bench seat, and toilet rails . Recommended that they contact DME supplier or insurance company regarding coverage of this equipment and patient cost. Unfortunately [facility] does not have that information.  . Collaborated with Jamelle Haring, LPN at [facility] regarding DME supply orders and continuation of home health services since PT has ended. The expectation is that they will extend these services and add OT. [name] Previously provided with RN Contact information and encouraged to reach out as needed  Patient Self Care Activities:  . Performs ADL's independently . Unable to perform IADLs independently due to AKA  Initial goal documentation        The patient verbalized understanding of instructions provided today and declined a print copy of patient instruction materials.   Follow-up Plan The care management team will reach out to the patient again over the next 20 days.   Chong Sicilian, BSN, RN-BC Embedded Chronic Care Manager [facility] Direct Dial: [phone]

## 2019-08-14 NOTE — Chronic Care Management (AMB) (Signed)
  Chronic Care Management   Follow Up Note   08/14/2019 Name: Todd [name] MRN: [id number] DOB: [birth year]  Referred by: [name], MD Reason for referral : Chronic Care Management (RN Follow up)   Todd [name] is a 64 y.o. year old male who is a primary care patient of [name], [name], MD. The CCM team was consulted for assistance with chronic disease management and care coordination needs.    Review of patient status, including review of consultants reports, relevant laboratory and other test results, and collaboration with appropriate care team members and the patient's provider was performed as part of comprehensive patient evaluation and provision of chronic care management services.    RN Care Plan           This Visit's Progress   . Home Health & DME Supplies       CARE PLAN ENTRY (see longtitudinal plan of care for additional care plan information) Home health and DME supply needs in patient with HTN, DM, and bilateral AKA  Current Barriers:  [name] Knowledge Deficits related to insurance coverage of supplies . Film/video editor.   Nurse Case Manager Clinical Goal(s):  [name] Over the next 30 days, patient will verbalize understanding of plan for obtaining DME supplies . Over the next 30 day, patient will work with [facility] agency OT department   Interventions:  . Communicated with sister, [name] regarding OT, shower bench seat, and toilet rails . Recommended that they contact DME supplier or insurance company regarding coverage of this equipment and patient cost. Unfortunately [facility] does not have that information.  . Collaborated with Jamelle Haring, LPN at [facility] regarding DME supply orders and continuation of home health services since PT has ended. The expectation is that they will extend these services and add OT. [name] Previously provided with RN Contact information and encouraged to reach out as needed  Patient Self Care Activities:  . Performs ADL's independently . Unable  to perform IADLs independently due to AKA  Initial goal documentation         Plan:   The care management team will reach out to the patient again over the next 20 days.    Chong Sicilian, BSN, RN-BC Embedded Chronic Care Manager [facility] Direct Dial: [phone]

## 2019-08-15 DIAGNOSIS — E1122 Type 2 diabetes mellitus with diabetic chronic kidney disease: Secondary | ICD-10-CM | POA: Diagnosis not present

## 2019-08-15 DIAGNOSIS — F419 Anxiety disorder, unspecified: Secondary | ICD-10-CM | POA: Diagnosis not present

## 2019-08-15 DIAGNOSIS — Z4781 Encounter for orthopedic aftercare following surgical amputation: Secondary | ICD-10-CM | POA: Diagnosis not present

## 2019-08-15 DIAGNOSIS — F4321 Adjustment disorder with depressed mood: Secondary | ICD-10-CM | POA: Diagnosis not present

## 2019-08-15 DIAGNOSIS — I129 Hypertensive chronic kidney disease with stage 1 through stage 4 chronic kidney disease, or unspecified chronic kidney disease: Secondary | ICD-10-CM | POA: Diagnosis not present

## 2019-08-15 DIAGNOSIS — E039 Hypothyroidism, unspecified: Secondary | ICD-10-CM | POA: Diagnosis not present

## 2019-08-15 DIAGNOSIS — E785 Hyperlipidemia, unspecified: Secondary | ICD-10-CM | POA: Diagnosis not present

## 2019-08-15 DIAGNOSIS — N189 Chronic kidney disease, unspecified: Secondary | ICD-10-CM | POA: Diagnosis not present

## 2019-08-15 DIAGNOSIS — F329 Major depressive disorder, single episode, unspecified: Secondary | ICD-10-CM | POA: Diagnosis not present

## 2019-08-19 DIAGNOSIS — I129 Hypertensive chronic kidney disease with stage 1 through stage 4 chronic kidney disease, or unspecified chronic kidney disease: Secondary | ICD-10-CM | POA: Diagnosis not present

## 2019-08-19 DIAGNOSIS — F4321 Adjustment disorder with depressed mood: Secondary | ICD-10-CM | POA: Diagnosis not present

## 2019-08-19 DIAGNOSIS — Z4781 Encounter for orthopedic aftercare following surgical amputation: Secondary | ICD-10-CM | POA: Diagnosis not present

## 2019-08-19 DIAGNOSIS — E1122 Type 2 diabetes mellitus with diabetic chronic kidney disease: Secondary | ICD-10-CM | POA: Diagnosis not present

## 2019-08-19 DIAGNOSIS — F419 Anxiety disorder, unspecified: Secondary | ICD-10-CM | POA: Diagnosis not present

## 2019-08-19 DIAGNOSIS — E039 Hypothyroidism, unspecified: Secondary | ICD-10-CM | POA: Diagnosis not present

## 2019-08-19 DIAGNOSIS — E785 Hyperlipidemia, unspecified: Secondary | ICD-10-CM | POA: Diagnosis not present

## 2019-08-19 DIAGNOSIS — N189 Chronic kidney disease, unspecified: Secondary | ICD-10-CM | POA: Diagnosis not present

## 2019-08-19 DIAGNOSIS — F329 Major depressive disorder, single episode, unspecified: Secondary | ICD-10-CM | POA: Diagnosis not present

## 2019-08-20 DIAGNOSIS — F4321 Adjustment disorder with depressed mood: Secondary | ICD-10-CM | POA: Diagnosis not present

## 2019-08-20 DIAGNOSIS — E1122 Type 2 diabetes mellitus with diabetic chronic kidney disease: Secondary | ICD-10-CM | POA: Diagnosis not present

## 2019-08-20 DIAGNOSIS — I129 Hypertensive chronic kidney disease with stage 1 through stage 4 chronic kidney disease, or unspecified chronic kidney disease: Secondary | ICD-10-CM | POA: Diagnosis not present

## 2019-08-20 DIAGNOSIS — F419 Anxiety disorder, unspecified: Secondary | ICD-10-CM | POA: Diagnosis not present

## 2019-08-20 DIAGNOSIS — F329 Major depressive disorder, single episode, unspecified: Secondary | ICD-10-CM | POA: Diagnosis not present

## 2019-08-20 DIAGNOSIS — N189 Chronic kidney disease, unspecified: Secondary | ICD-10-CM | POA: Diagnosis not present

## 2019-08-20 DIAGNOSIS — E039 Hypothyroidism, unspecified: Secondary | ICD-10-CM | POA: Diagnosis not present

## 2019-08-20 DIAGNOSIS — Z4781 Encounter for orthopedic aftercare following surgical amputation: Secondary | ICD-10-CM | POA: Diagnosis not present

## 2019-08-20 DIAGNOSIS — E785 Hyperlipidemia, unspecified: Secondary | ICD-10-CM | POA: Diagnosis not present

## 2019-08-22 ENCOUNTER — Ambulatory Visit (HOSPITAL_COMMUNITY): Payer: Medicare HMO

## 2019-08-27 ENCOUNTER — Telehealth: Payer: Medicare HMO | Admitting: Cardiovascular Disease

## 2019-08-27 DIAGNOSIS — F4321 Adjustment disorder with depressed mood: Secondary | ICD-10-CM | POA: Diagnosis not present

## 2019-08-27 DIAGNOSIS — E785 Hyperlipidemia, unspecified: Secondary | ICD-10-CM | POA: Diagnosis not present

## 2019-08-27 DIAGNOSIS — E039 Hypothyroidism, unspecified: Secondary | ICD-10-CM | POA: Diagnosis not present

## 2019-08-27 DIAGNOSIS — N189 Chronic kidney disease, unspecified: Secondary | ICD-10-CM | POA: Diagnosis not present

## 2019-08-27 DIAGNOSIS — F329 Major depressive disorder, single episode, unspecified: Secondary | ICD-10-CM | POA: Diagnosis not present

## 2019-08-27 DIAGNOSIS — F419 Anxiety disorder, unspecified: Secondary | ICD-10-CM | POA: Diagnosis not present

## 2019-08-27 DIAGNOSIS — Z89612 Acquired absence of left leg above knee: Secondary | ICD-10-CM | POA: Diagnosis not present

## 2019-08-27 DIAGNOSIS — Z89611 Acquired absence of right leg above knee: Secondary | ICD-10-CM | POA: Diagnosis not present

## 2019-08-27 DIAGNOSIS — I129 Hypertensive chronic kidney disease with stage 1 through stage 4 chronic kidney disease, or unspecified chronic kidney disease: Secondary | ICD-10-CM | POA: Diagnosis not present

## 2019-08-27 DIAGNOSIS — E1122 Type 2 diabetes mellitus with diabetic chronic kidney disease: Secondary | ICD-10-CM | POA: Diagnosis not present

## 2019-08-27 DIAGNOSIS — Z4781 Encounter for orthopedic aftercare following surgical amputation: Secondary | ICD-10-CM | POA: Diagnosis not present

## 2019-08-28 ENCOUNTER — Ambulatory Visit (HOSPITAL_COMMUNITY)
Admission: RE | Admit: 2019-08-28 | Discharge: 2019-08-28 | Disposition: A | Discharge status: Home / Self Care | Payer: Medicare HMO | Source: Ambulatory Visit | Attending: Cardiovascular Disease | Admitting: Cardiovascular Disease

## 2019-08-28 ENCOUNTER — Other Ambulatory Visit: Payer: Self-pay

## 2019-08-28 DIAGNOSIS — I6523 Occlusion and stenosis of bilateral carotid arteries: Secondary | ICD-10-CM | POA: Diagnosis not present

## 2019-08-29 ENCOUNTER — Encounter: Payer: Self-pay | Admitting: Cardiovascular Disease

## 2019-08-29 ENCOUNTER — Telehealth: Payer: Medicare HMO | Admitting: Cardiovascular Disease

## 2019-08-29 ENCOUNTER — Telehealth: Payer: Self-pay | Admitting: "Endocrinology

## 2019-08-30 ENCOUNTER — Other Ambulatory Visit: Payer: Self-pay | Admitting: "Endocrinology

## 2019-09-02 ENCOUNTER — Other Ambulatory Visit: Payer: Self-pay | Admitting: "Endocrinology

## 2019-09-03 ENCOUNTER — Other Ambulatory Visit: Payer: Self-pay

## 2019-09-03 ENCOUNTER — Other Ambulatory Visit: Payer: Self-pay | Admitting: "Endocrinology

## 2019-09-03 DIAGNOSIS — E785 Hyperlipidemia, unspecified: Secondary | ICD-10-CM | POA: Diagnosis not present

## 2019-09-03 DIAGNOSIS — F4321 Adjustment disorder with depressed mood: Secondary | ICD-10-CM | POA: Diagnosis not present

## 2019-09-03 DIAGNOSIS — N189 Chronic kidney disease, unspecified: Secondary | ICD-10-CM | POA: Diagnosis not present

## 2019-09-03 DIAGNOSIS — F419 Anxiety disorder, unspecified: Secondary | ICD-10-CM | POA: Diagnosis not present

## 2019-09-03 DIAGNOSIS — E1122 Type 2 diabetes mellitus with diabetic chronic kidney disease: Secondary | ICD-10-CM | POA: Diagnosis not present

## 2019-09-03 DIAGNOSIS — I129 Hypertensive chronic kidney disease with stage 1 through stage 4 chronic kidney disease, or unspecified chronic kidney disease: Secondary | ICD-10-CM | POA: Diagnosis not present

## 2019-09-03 DIAGNOSIS — F329 Major depressive disorder, single episode, unspecified: Secondary | ICD-10-CM | POA: Diagnosis not present

## 2019-09-03 DIAGNOSIS — Z4781 Encounter for orthopedic aftercare following surgical amputation: Secondary | ICD-10-CM | POA: Diagnosis not present

## 2019-09-03 DIAGNOSIS — E039 Hypothyroidism, unspecified: Secondary | ICD-10-CM | POA: Diagnosis not present

## 2019-09-03 MED ORDER — LEVOTHYROXINE SODIUM 125 MCG PO TABS: 125.0000 ug | ORAL_TABLET | Freq: Every day | ORAL | 0 refills | Status: DC

## 2019-09-03 NOTE — Telephone Encounter (Signed)
The dose for levothyroxine the pharmacy was requesting was different from what Dr.Nida changed him to. I sent in a refill for the appropiate dose.

## 2019-09-03 NOTE — Telephone Encounter (Signed)
Pt and family member has called about his thyroid medication. It says denied bc refill not appropriate. Is it he not suppose to be on this because his sister said he is taking his last pill today? I just need to call him back. thanks

## 2019-09-04 DIAGNOSIS — F4321 Adjustment disorder with depressed mood: Secondary | ICD-10-CM | POA: Diagnosis not present

## 2019-09-04 DIAGNOSIS — N189 Chronic kidney disease, unspecified: Secondary | ICD-10-CM | POA: Diagnosis not present

## 2019-09-04 DIAGNOSIS — E785 Hyperlipidemia, unspecified: Secondary | ICD-10-CM | POA: Diagnosis not present

## 2019-09-04 DIAGNOSIS — E1122 Type 2 diabetes mellitus with diabetic chronic kidney disease: Secondary | ICD-10-CM | POA: Diagnosis not present

## 2019-09-04 DIAGNOSIS — Z4781 Encounter for orthopedic aftercare following surgical amputation: Secondary | ICD-10-CM | POA: Diagnosis not present

## 2019-09-04 DIAGNOSIS — F329 Major depressive disorder, single episode, unspecified: Secondary | ICD-10-CM | POA: Diagnosis not present

## 2019-09-04 DIAGNOSIS — F419 Anxiety disorder, unspecified: Secondary | ICD-10-CM | POA: Diagnosis not present

## 2019-09-04 DIAGNOSIS — E039 Hypothyroidism, unspecified: Secondary | ICD-10-CM | POA: Diagnosis not present

## 2019-09-04 DIAGNOSIS — I129 Hypertensive chronic kidney disease with stage 1 through stage 4 chronic kidney disease, or unspecified chronic kidney disease: Secondary | ICD-10-CM | POA: Diagnosis not present

## 2019-09-05 DIAGNOSIS — Z89612 Acquired absence of left leg above knee: Secondary | ICD-10-CM | POA: Diagnosis not present

## 2019-09-05 DIAGNOSIS — Z89611 Acquired absence of right leg above knee: Secondary | ICD-10-CM | POA: Diagnosis not present

## 2019-09-10 ENCOUNTER — Other Ambulatory Visit: Payer: Self-pay | Admitting: Family Medicine

## 2019-09-13 ENCOUNTER — Ambulatory Visit (INDEPENDENT_AMBULATORY_CARE_PROVIDER_SITE_OTHER): Payer: Medicare HMO | Admitting: Licensed Clinical Social Worker

## 2019-09-13 DIAGNOSIS — N184 Chronic kidney disease, stage 4 (severe): Secondary | ICD-10-CM

## 2019-09-13 DIAGNOSIS — N183 Chronic kidney disease, stage 3 unspecified: Secondary | ICD-10-CM

## 2019-09-13 DIAGNOSIS — F4321 Adjustment disorder with depressed mood: Secondary | ICD-10-CM | POA: Diagnosis not present

## 2019-09-13 DIAGNOSIS — I10 Essential (primary) hypertension: Secondary | ICD-10-CM

## 2019-09-13 DIAGNOSIS — Z794 Long term (current) use of insulin: Secondary | ICD-10-CM

## 2019-09-13 DIAGNOSIS — E039 Hypothyroidism, unspecified: Secondary | ICD-10-CM | POA: Diagnosis not present

## 2019-09-13 DIAGNOSIS — F329 Major depressive disorder, single episode, unspecified: Secondary | ICD-10-CM | POA: Diagnosis not present

## 2019-09-13 DIAGNOSIS — N189 Chronic kidney disease, unspecified: Secondary | ICD-10-CM | POA: Diagnosis not present

## 2019-09-13 DIAGNOSIS — F419 Anxiety disorder, unspecified: Secondary | ICD-10-CM | POA: Diagnosis not present

## 2019-09-13 DIAGNOSIS — E1122 Type 2 diabetes mellitus with diabetic chronic kidney disease: Secondary | ICD-10-CM

## 2019-09-13 DIAGNOSIS — I129 Hypertensive chronic kidney disease with stage 1 through stage 4 chronic kidney disease, or unspecified chronic kidney disease: Secondary | ICD-10-CM | POA: Diagnosis not present

## 2019-09-13 DIAGNOSIS — E785 Hyperlipidemia, unspecified: Secondary | ICD-10-CM | POA: Diagnosis not present

## 2019-09-13 DIAGNOSIS — Z4781 Encounter for orthopedic aftercare following surgical amputation: Secondary | ICD-10-CM | POA: Diagnosis not present

## 2019-09-13 DIAGNOSIS — E782 Mixed hyperlipidemia: Secondary | ICD-10-CM | POA: Diagnosis not present

## 2019-09-13 NOTE — Chronic Care Management (AMB) (Signed)
  Care Management Note   Todd [name] is a 64 y.o. year old male who is a primary care patient of [name], [name], MD. The CM team was consulted for assistance with chronic disease management and care coordination.   I reached out to [name] by phone today.   Review of patient status, including review of consultants reports, relevant laboratory and other test results, and collaboration with appropriate care team members and the patient's provider was performed as part of comprehensive patient evaluation and provision of chronic care management services.   Social determinants of health: risk of social isolation; risk of tobacco use;risk of depression; risk of stress; risk of physical inactivity    Chronic Care Management from 07/03/2019 in [place]  PHQ-9 Total Score  6     GAD 7 : Generalized Anxiety Score 07/03/2019  Nervous, Anxious, on Edge 1  Control/stop worrying 0  Worry too much - different things 1  Trouble relaxing 0  Restless 1  Easily annoyed or irritable 1  Afraid - awful might happen 1  Total GAD 7 Score 5  Anxiety Difficulty Somewhat difficult   Medications   aspirin EC 81 MG tablet atorvastatin (LIPITOR) 40 MG tablet blood glucose meter kit and supplies cetirizine (ZYRTEC) 10 MG tablet folic acid (FOLVITE) 1 MG tablet glucose blood (ACCU-CHEK AVIVA PLUS) test strip Insulin Glargine (LANTUS SOLOSTAR) 100 UNIT/ML Solostar Pen Insulin Pen Needle 31G X 5 MM MISC levothyroxine (SYNTHROID) 125 MCG tablet megestrol (MEGACE) 400 MG/10ML suspension metoprolol succinate (TOPROL-XL) 100 MG 24 hr tablet sertraline (ZOLOFT) 50 MG tablet  Goals        . Client wants to talk with LCSW in next 30 days to discuss grief issues of clinet related to recent death of his mother (his mother died on 2019-06-14) (pt-stated)     Current Barriers:  [name] Mobility challenges in client with Chronic Diagnoses of HTN, Type 2 DM, CKD, HLD  Clinical Social  Work Clinical Goal(s):  [name] LCSW will talk with client in next 30 days to discuss client management of grief issues faced  Interventions:   Previously talked with DeanCox, sister of client about client grief issues over the recent death of his mother  Previously talked with [name], about client use of alcohol and treatment program in area related to alcohol ([facility] in [place], [place])  Talked with [name] about transport needs of client  Talked withclientabout mobility challenges of client  Previously talked with [name] about CCM support services  Talked with client about appetite of client  Talked with client about sleeping issues of client  Previously talked with [name] about relaxation techniques of client (enjoys watching TV,enjoys visits with his sister who travels to see client, enjoys phone calls with his sister)  Talked with Greysen about in home physical therapy sessions he is receiving  Talked with client about client use of hospital bed   Patient Self Care Activities:  Takes medications as prescribed   Self Care Deficit:    Mobility challenges Alcohol use issue    Initial goal documentation       Follow Up Plan:LCSW to call client or sister, [name] in next 4 weeks to talk about client grief issues faced  Captain Blucher.Vickki Igou MSW, LCSW Licensed Clinical Social Worker [facility] Management [phone]

## 2019-09-13 NOTE — Patient Instructions (Addendum)
Licensed Clinical Social Worker Visit Information  Goals we discussed today:  Goals        . Client wants to talk with LCSW in next 30 days to discuss grief issues of clinet related to recent death of his mother (his mother died on 12-Jun-2019) (pt-stated)     Current Barriers:  [name] Mobility challenges in client with Chronic Diagnoses of HTN, Type 2 DM, CKD, HLD  Clinical Social Work Clinical Goal(s):  [name] LCSW will talk with client in next 30 days to discuss client management of grief issues faced  Interventions: Previously talked with [name], sister of client about client grief issues over the recent death of his mother  Previously talked with [name], about client use of alcohol and treatment program in area related to alcohol (ADS in [place], [place])  Talked with client about transport needs of client  Talked with client about mobility challenges of client  Previously talked with [name] about CCM support services  Talked with client about appetite of client  Talked with client about sleeping issues of client  Previously talked with [name] about relaxation techniques of client (enjoys watching TV,enjoys visits with his sister who travels to see client, enjoys phone calls with his sister)  Talked with Giannis about in home physical therapy sessions he is receiving  Talked with client about client use of hospital bed  Patient Self Care Activities:  Takes medications as prescribed   Self Care Deficit:    Mobility challenges Alcohol use issue    Initial goal documentation       Materials Provided:  No  Follow Up Plan:LCSW to call client or sister, [name] in next 4 weeks to talk about client grief issues faced  The patient verbalized understanding of instructions provided today and declined a print copy of patient instruction materials.   [name].Theron Cumbie MSW, LCSW Licensed Clinical Social Worker [facility] [phone]

## 2019-09-14 DIAGNOSIS — N189 Chronic kidney disease, unspecified: Secondary | ICD-10-CM | POA: Diagnosis not present

## 2019-09-14 DIAGNOSIS — F4321 Adjustment disorder with depressed mood: Secondary | ICD-10-CM | POA: Diagnosis not present

## 2019-09-14 DIAGNOSIS — I129 Hypertensive chronic kidney disease with stage 1 through stage 4 chronic kidney disease, or unspecified chronic kidney disease: Secondary | ICD-10-CM | POA: Diagnosis not present

## 2019-09-14 DIAGNOSIS — E785 Hyperlipidemia, unspecified: Secondary | ICD-10-CM | POA: Diagnosis not present

## 2019-09-14 DIAGNOSIS — F419 Anxiety disorder, unspecified: Secondary | ICD-10-CM | POA: Diagnosis not present

## 2019-09-14 DIAGNOSIS — E1122 Type 2 diabetes mellitus with diabetic chronic kidney disease: Secondary | ICD-10-CM | POA: Diagnosis not present

## 2019-09-14 DIAGNOSIS — F329 Major depressive disorder, single episode, unspecified: Secondary | ICD-10-CM | POA: Diagnosis not present

## 2019-09-14 DIAGNOSIS — Z4781 Encounter for orthopedic aftercare following surgical amputation: Secondary | ICD-10-CM | POA: Diagnosis not present

## 2019-09-14 DIAGNOSIS — E039 Hypothyroidism, unspecified: Secondary | ICD-10-CM | POA: Diagnosis not present

## 2019-09-16 ENCOUNTER — Telehealth: Payer: Self-pay | Admitting: "Endocrinology

## 2019-09-16 DIAGNOSIS — E039 Hypothyroidism, unspecified: Secondary | ICD-10-CM

## 2019-09-16 DIAGNOSIS — E559 Vitamin D deficiency, unspecified: Secondary | ICD-10-CM

## 2019-09-16 NOTE — Telephone Encounter (Signed)
Can you change lab order to [facility] so I can fax to [facility]. thanks

## 2019-09-16 NOTE — Telephone Encounter (Signed)
New lab order entered.

## 2019-09-17 ENCOUNTER — Ambulatory Visit: Payer: Medicare Other | Admitting: "Endocrinology

## 2019-09-19 DIAGNOSIS — I129 Hypertensive chronic kidney disease with stage 1 through stage 4 chronic kidney disease, or unspecified chronic kidney disease: Secondary | ICD-10-CM | POA: Diagnosis not present

## 2019-09-19 DIAGNOSIS — N189 Chronic kidney disease, unspecified: Secondary | ICD-10-CM | POA: Diagnosis not present

## 2019-09-19 DIAGNOSIS — Z4781 Encounter for orthopedic aftercare following surgical amputation: Secondary | ICD-10-CM | POA: Diagnosis not present

## 2019-09-19 DIAGNOSIS — F329 Major depressive disorder, single episode, unspecified: Secondary | ICD-10-CM | POA: Diagnosis not present

## 2019-09-19 DIAGNOSIS — F4321 Adjustment disorder with depressed mood: Secondary | ICD-10-CM | POA: Diagnosis not present

## 2019-09-19 DIAGNOSIS — E785 Hyperlipidemia, unspecified: Secondary | ICD-10-CM | POA: Diagnosis not present

## 2019-09-19 DIAGNOSIS — F419 Anxiety disorder, unspecified: Secondary | ICD-10-CM | POA: Diagnosis not present

## 2019-09-19 DIAGNOSIS — E1122 Type 2 diabetes mellitus with diabetic chronic kidney disease: Secondary | ICD-10-CM | POA: Diagnosis not present

## 2019-09-19 DIAGNOSIS — E039 Hypothyroidism, unspecified: Secondary | ICD-10-CM | POA: Diagnosis not present

## 2019-09-24 DIAGNOSIS — E1122 Type 2 diabetes mellitus with diabetic chronic kidney disease: Secondary | ICD-10-CM | POA: Diagnosis not present

## 2019-09-24 DIAGNOSIS — E785 Hyperlipidemia, unspecified: Secondary | ICD-10-CM | POA: Diagnosis not present

## 2019-09-24 DIAGNOSIS — F329 Major depressive disorder, single episode, unspecified: Secondary | ICD-10-CM | POA: Diagnosis not present

## 2019-09-24 DIAGNOSIS — N189 Chronic kidney disease, unspecified: Secondary | ICD-10-CM | POA: Diagnosis not present

## 2019-09-24 DIAGNOSIS — Z4781 Encounter for orthopedic aftercare following surgical amputation: Secondary | ICD-10-CM | POA: Diagnosis not present

## 2019-09-24 DIAGNOSIS — F4321 Adjustment disorder with depressed mood: Secondary | ICD-10-CM | POA: Diagnosis not present

## 2019-09-24 DIAGNOSIS — I129 Hypertensive chronic kidney disease with stage 1 through stage 4 chronic kidney disease, or unspecified chronic kidney disease: Secondary | ICD-10-CM | POA: Diagnosis not present

## 2019-09-24 DIAGNOSIS — F419 Anxiety disorder, unspecified: Secondary | ICD-10-CM | POA: Diagnosis not present

## 2019-09-24 DIAGNOSIS — E039 Hypothyroidism, unspecified: Secondary | ICD-10-CM | POA: Diagnosis not present

## 2019-09-26 ENCOUNTER — Telehealth: Payer: Self-pay | Admitting: Family Medicine

## 2019-09-26 ENCOUNTER — Ambulatory Visit (INDEPENDENT_AMBULATORY_CARE_PROVIDER_SITE_OTHER): Payer: Medicare HMO | Admitting: Family Medicine

## 2019-09-26 DIAGNOSIS — M7918 Myalgia, other site: Secondary | ICD-10-CM | POA: Diagnosis not present

## 2019-09-26 NOTE — Progress Notes (Signed)
Virtual Visit via Telephone Note  I connected with Todd [name] on 09/30/19 at 5:23 PM by telephone and verified that I am speaking with the correct person using two identifiers. Todd [name] is currently located at home and a friend is currently with him during this visit. The provider, Loman Brooklyn, FNP is located in their office at time of visit.  I discussed the limitations, risks, security and privacy concerns of performing an evaluation and management service by telephone and the availability of in person appointments. I also discussed with the patient that there may be a patient responsible charge related to this service. The patient expressed understanding and agreed to proceed.  Subjective: PCP: Todd Fraise, MD  Chief Complaint  Patient presents with  . Other    sore bottom   Patient reports he is sore on his backside.  He wonders if he has a boil.  Denies feeling any bump or hardened area of his skin.  Also denies any swelling, drainage, or warmth.  He is in a wheelchair as he has had bilateral BKA's but states that he does sometimes lie down to get off of his bottom.  He does have a cushion in his wheelchair.  Given this information I question whether he had some skin breakdown that was causing his bottom to feel sore but he states that there is no open skin.  Denies any ulcers or skin breakdown on his bottom.   ROS: Per HPI  Current Outpatient Medications:  .  aspirin EC 81 MG tablet, Take 81 mg by mouth daily., Disp: , Rfl:  .  atorvastatin (LIPITOR) 40 MG tablet, Take 1 tablet (40 mg total) by mouth daily at 6 PM. Needs to be seen for further refills., Disp: 30 tablet, Rfl: 0 .  blood glucose meter kit and supplies, Dispense based on patient and insurance preference. Use up to four times daily as directed. (FOR ICD-10 E10.9, E11.9)., Disp: 1 each, Rfl: 0 .  cetirizine (ZYRTEC) 10 MG tablet, Take 1 tablet (10 mg total) by mouth daily. For allergy symptoms, Disp: 90  tablet, Rfl: 3 .  folic acid (FOLVITE) 1 MG tablet, Take 1 tablet (1 mg total) by mouth daily., Disp: , Rfl:  .  glucose blood (ACCU-CHEK AVIVA PLUS) test strip, Check blood sugars four times a day, Disp: 400 each, Rfl: 3 .  Insulin Glargine (LANTUS SOLOSTAR) 100 UNIT/ML Solostar Pen, Inject 50 Units into the skin at bedtime., Disp: 30 mL, Rfl: 5 .  Insulin Pen Needle 31G X 5 MM MISC, 1 Device by Does not apply route as directed., Disp: 100 each, Rfl: 1 .  levothyroxine (SYNTHROID) 125 MCG tablet, Take 1 tablet (125 mcg total) by mouth daily before breakfast., Disp: 90 tablet, Rfl: 0 .  megestrol (MEGACE) 400 MG/10ML suspension, Take 10 mLs (400 mg total) by mouth 2 (two) times daily. For appetite stimulation, Disp: 600 mL, Rfl: 2 .  metoprolol succinate (TOPROL-XL) 100 MG 24 hr tablet, Take 1 tablet (100 mg total) by mouth daily. Take with or immediately following a meal., Disp: 90 tablet, Rfl: 1 .  sertraline (ZOLOFT) 50 MG tablet, Take 1 tablet (50 mg total) by mouth at bedtime. For anxiety and depression, Disp: 30 tablet, Rfl: 5  Allergies  Allergen Reactions  . Lisinopril Other (See Comments)    Hyperkalemia / Renal failure   Past Medical History:  Diagnosis Date  . AKI (acute kidney injury) ([place])   . Constipated   . Diabetes  mellitus without complication ([place])   . Diarrhea   . Elevated LFTs   . Goiter   . Gout   . Hyperlipidemia   . Hypertension   . Leukocytosis   . Reactive thrombocytosis   . Right BKA infection ([place]) 08/2016  . Right leg pain   . Sepsis due to undetermined organism ([place])   . Thyroid disease   . Wound infection after surgery 08/2016    Observations/Objective: A&O  No respiratory distress or wheezing audible over the phone Mood, judgement, and thought processes all WNL   Assessment and Plan: 1. Pain in right buttock - Advised patient he does not need an antibiotic if he does not have a boil or any type of infection.  Discussed it sounds like his  bottom is just sore but there is no skin breakdown.  Discussed that skin breakdown is very possible so he needs to make sure he is adjusting his position and redistributing pressure to other areas besides the area that is bothering him.  Also encouraged him to get out of his wheelchair and lie down in the bed more frequently than what he is doing.  Advised if his skin does breakdown or if anything changes he needs to let us know.   Follow Up Instructions:  I discussed the assessment and treatment plan with the patient. The patient was provided an opportunity to ask questions and all were answered. The patient agreed with the plan and demonstrated an understanding of the instructions.   The patient was advised to call back or seek an in-person evaluation if the symptoms worsen or if the condition fails to improve as anticipated.  The above assessment and management plan was discussed with the patient. The patient verbalized understanding of and has agreed to the management plan. Patient is aware to call the clinic if symptoms persist or worsen. Patient is aware when to return to the clinic for a follow-up visit. Patient educated on when it is appropriate to go to the emergency department.   Time call ended: 5:30 PM  I provided 9 minutes of non-face-to-face time during this encounter.  [name], MSN, APRN, FNP-C [facility] 09/30/19

## 2019-09-26 NOTE — Telephone Encounter (Signed)
Televisit made  

## 2019-09-27 DIAGNOSIS — Z89611 Acquired absence of right leg above knee: Secondary | ICD-10-CM | POA: Diagnosis not present

## 2019-09-27 DIAGNOSIS — Z89612 Acquired absence of left leg above knee: Secondary | ICD-10-CM | POA: Diagnosis not present

## 2019-09-30 ENCOUNTER — Other Ambulatory Visit: Payer: Medicare HMO

## 2019-09-30 ENCOUNTER — Encounter: Payer: Self-pay | Admitting: Family Medicine

## 2019-09-30 ENCOUNTER — Other Ambulatory Visit: Payer: Self-pay | Admitting: *Deleted

## 2019-09-30 ENCOUNTER — Other Ambulatory Visit: Payer: Self-pay

## 2019-09-30 DIAGNOSIS — E1165 Type 2 diabetes mellitus with hyperglycemia: Secondary | ICD-10-CM

## 2019-09-30 DIAGNOSIS — F1020 Alcohol dependence, uncomplicated: Secondary | ICD-10-CM

## 2019-09-30 DIAGNOSIS — E1122 Type 2 diabetes mellitus with diabetic chronic kidney disease: Secondary | ICD-10-CM

## 2019-09-30 DIAGNOSIS — E041 Nontoxic single thyroid nodule: Secondary | ICD-10-CM

## 2019-09-30 DIAGNOSIS — D75839 Thrombocytosis, unspecified: Secondary | ICD-10-CM

## 2019-09-30 DIAGNOSIS — I10 Essential (primary) hypertension: Secondary | ICD-10-CM

## 2019-09-30 DIAGNOSIS — E782 Mixed hyperlipidemia: Secondary | ICD-10-CM

## 2019-09-30 DIAGNOSIS — E039 Hypothyroidism, unspecified: Secondary | ICD-10-CM | POA: Diagnosis not present

## 2019-09-30 DIAGNOSIS — N184 Chronic kidney disease, stage 4 (severe): Secondary | ICD-10-CM | POA: Diagnosis not present

## 2019-09-30 DIAGNOSIS — D473 Essential (hemorrhagic) thrombocythemia: Secondary | ICD-10-CM

## 2019-09-30 DIAGNOSIS — N183 Chronic kidney disease, stage 3 unspecified: Secondary | ICD-10-CM

## 2019-09-30 DIAGNOSIS — E559 Vitamin D deficiency, unspecified: Secondary | ICD-10-CM

## 2019-09-30 DIAGNOSIS — Z794 Long term (current) use of insulin: Secondary | ICD-10-CM

## 2019-09-30 LAB — BAYER DCA HB A1C WAIVED: HB A1C (BAYER DCA - WAIVED): 6.8 % (ref <7.0)

## 2019-10-01 ENCOUNTER — Other Ambulatory Visit: Payer: Self-pay | Admitting: "Endocrinology

## 2019-10-01 ENCOUNTER — Ambulatory Visit: Payer: Medicare HMO | Admitting: Family Medicine

## 2019-10-01 DIAGNOSIS — I129 Hypertensive chronic kidney disease with stage 1 through stage 4 chronic kidney disease, or unspecified chronic kidney disease: Secondary | ICD-10-CM | POA: Diagnosis not present

## 2019-10-01 DIAGNOSIS — N189 Chronic kidney disease, unspecified: Secondary | ICD-10-CM | POA: Diagnosis not present

## 2019-10-01 DIAGNOSIS — F4321 Adjustment disorder with depressed mood: Secondary | ICD-10-CM | POA: Diagnosis not present

## 2019-10-01 DIAGNOSIS — Z4781 Encounter for orthopedic aftercare following surgical amputation: Secondary | ICD-10-CM | POA: Diagnosis not present

## 2019-10-01 DIAGNOSIS — E1122 Type 2 diabetes mellitus with diabetic chronic kidney disease: Secondary | ICD-10-CM | POA: Diagnosis not present

## 2019-10-01 DIAGNOSIS — F419 Anxiety disorder, unspecified: Secondary | ICD-10-CM | POA: Diagnosis not present

## 2019-10-01 DIAGNOSIS — E039 Hypothyroidism, unspecified: Secondary | ICD-10-CM | POA: Diagnosis not present

## 2019-10-01 DIAGNOSIS — F329 Major depressive disorder, single episode, unspecified: Secondary | ICD-10-CM | POA: Diagnosis not present

## 2019-10-01 DIAGNOSIS — E785 Hyperlipidemia, unspecified: Secondary | ICD-10-CM | POA: Diagnosis not present

## 2019-10-01 LAB — CBC WITH DIFFERENTIAL/PLATELET
Basophils Absolute: 0.1 10*3/uL (ref 0.0–0.2)
Basos: 1 %
EOS (ABSOLUTE): 0.3 10*3/uL (ref 0.0–0.4)
Eos: 4 %
Hematocrit: 42.8 % (ref 37.5–51.0)
Hemoglobin: 14.1 g/dL (ref 13.0–17.7)
Immature Grans (Abs): 0 10*3/uL (ref 0.0–0.1)
Immature Granulocytes: 1 %
Lymphocytes Absolute: 3.1 10*3/uL (ref 0.7–3.1)
Lymphs: 36 %
MCH: 28.5 pg (ref 26.6–33.0)
MCHC: 32.9 g/dL (ref 31.5–35.7)
MCV: 87 fL (ref 79–97)
Monocytes Absolute: 0.6 10*3/uL (ref 0.1–0.9)
Monocytes: 7 %
Neutrophils Absolute: 4.4 10*3/uL (ref 1.4–7.0)
Neutrophils: 51 %
Platelets: 353 10*3/uL (ref 150–450)
RBC: 4.95 x10E6/uL (ref 4.14–5.80)
RDW: 15.1 % (ref 11.6–15.4)
WBC: 8.5 10*3/uL (ref 3.4–10.8)

## 2019-10-01 LAB — THYROID PANEL WITH TSH
Free Thyroxine Index: 4.8 (ref 1.2–4.9)
T3 Uptake Ratio: 34 % (ref 24–39)
T4, Total: 14.1 ug/dL — ABNORMAL HIGH (ref 4.5–12.0)
TSH: 0.255 u[IU]/mL — ABNORMAL LOW (ref 0.450–4.500)

## 2019-10-01 LAB — LIPID PANEL
Chol/HDL Ratio: 4 ratio (ref 0.0–5.0)
Cholesterol, Total: 109 mg/dL (ref 100–199)
HDL: 27 mg/dL — ABNORMAL LOW (ref >39)
LDL Chol Calc (NIH): 52 mg/dL (ref 0–99)
Triglycerides: 179 mg/dL — ABNORMAL HIGH (ref 0–149)
VLDL Cholesterol Cal: 30 mg/dL (ref 5–40)

## 2019-10-01 LAB — CMP14+EGFR
ALT: 31 IU/L (ref 0–44)
AST: 55 IU/L — ABNORMAL HIGH (ref 0–40)
Albumin/Globulin Ratio: 0.9 — ABNORMAL LOW (ref 1.2–2.2)
Albumin: 3.8 g/dL (ref 3.8–4.8)
Alkaline Phosphatase: 168 IU/L — ABNORMAL HIGH (ref 39–117)
BUN/Creatinine Ratio: 13 (ref 10–24)
BUN: 16 mg/dL (ref 8–27)
Bilirubin Total: 0.4 mg/dL (ref 0.0–1.2)
CO2: 17 mmol/L — ABNORMAL LOW (ref 20–29)
Calcium: 8.8 mg/dL (ref 8.6–10.2)
Chloride: 98 mmol/L (ref 96–106)
Creatinine, Ser: 1.27 mg/dL (ref 0.76–1.27)
GFR calc Af Amer: 69 mL/min/{1.73_m2} (ref >59)
GFR calc non Af Amer: 59 mL/min/{1.73_m2} — ABNORMAL LOW (ref >59)
Globulin, Total: 4.4 g/dL (ref 1.5–4.5)
Glucose: 131 mg/dL — ABNORMAL HIGH (ref 65–99)
Potassium: 4.5 mmol/L (ref 3.5–5.2)
Sodium: 131 mmol/L — ABNORMAL LOW (ref 134–144)
Total Protein: 8.2 g/dL (ref 6.0–8.5)

## 2019-10-01 LAB — T4, FREE: Free T4: 2.1 ng/dL — ABNORMAL HIGH (ref 0.82–1.77)

## 2019-10-01 LAB — VITAMIN D 25 HYDROXY (VIT D DEFICIENCY, FRACTURES): Vit D, 25-Hydroxy: 52.9 ng/mL (ref 30.0–100.0)

## 2019-10-01 MED ORDER — LEVOTHYROXINE SODIUM 100 MCG PO TABS: 100.0000 ug | ORAL_TABLET | Freq: Every day | ORAL | 1 refills | Status: DC

## 2019-10-02 ENCOUNTER — Ambulatory Visit: Payer: Medicare HMO | Admitting: Family Medicine

## 2019-10-02 ENCOUNTER — Other Ambulatory Visit: Payer: Self-pay

## 2019-10-02 ENCOUNTER — Ambulatory Visit (INDEPENDENT_AMBULATORY_CARE_PROVIDER_SITE_OTHER): Payer: Medicare HMO | Admitting: Family Medicine

## 2019-10-02 ENCOUNTER — Encounter: Payer: Self-pay | Admitting: Family Medicine

## 2019-10-02 VITALS — BP 127/70 | HR 61 | Temp 97.5°F

## 2019-10-02 DIAGNOSIS — N184 Chronic kidney disease, stage 4 (severe): Secondary | ICD-10-CM

## 2019-10-02 DIAGNOSIS — L89321 Pressure ulcer of left buttock, stage 1: Secondary | ICD-10-CM | POA: Diagnosis not present

## 2019-10-02 DIAGNOSIS — E1122 Type 2 diabetes mellitus with diabetic chronic kidney disease: Secondary | ICD-10-CM | POA: Diagnosis not present

## 2019-10-02 DIAGNOSIS — Z794 Long term (current) use of insulin: Secondary | ICD-10-CM

## 2019-10-02 NOTE — Progress Notes (Signed)
Subjective:  Patient ID: Todd [name], male    DOB: 10-Apr-1956  Age: 64 y.o. MRN: [id number]  CC: Recurrent Skin Infections   HPI Todd [name] presents for painful lesions at the buttocks.  He is concerned about boils.  He points to the ischial tuberosity region as being sore.  The patient is wheelchair-bound due to bilateral AKA.  Follow-up of diabetes. Patient does not check blood sugar at home he had an A1c drawn a couple days ago.  Results are attached.  Patient denies symptoms such as polyuria, polydipsia, excessive hunger, nausea No significant hypoglycemic spells noted. Medications as noted below. Taking them regularly without complication/adverse reaction being reported today.  Depression screen [employer] 2/9 10/02/2019 07/03/2019 05/01/2019  Decreased Interest 0 1 0  Down, Depressed, Hopeless 0 1 0  PHQ - 2 Score 0 2 0  Altered sleeping - 1 -  Tired, decreased energy - 1 -  Change in appetite - 1 -  Feeling bad or failure about yourself  - 0 -  Trouble concentrating - 0 -  Moving slowly or fidgety/restless - 1 -  Suicidal thoughts - 0 -  PHQ-9 Score - 6 -  Difficult doing work/chores - Somewhat difficult -  Some recent data might be hidden    History Todd [name] has a past medical history of AKI (acute kidney injury) ([place]), Constipated, Diabetes mellitus without complication ([place]), Diarrhea, Elevated LFTs, Goiter, Gout, Hyperlipidemia, Hypertension, Leukocytosis, Reactive thrombocytosis, Right BKA infection ([place]) (08/2016), Right leg pain, Sepsis due to undetermined organism [facility]), Thyroid disease, and Wound infection after surgery (08/2016).   He has a past surgical history that includes Thyroid surgery; Lower Extremity Angiography (Bilateral, 08/11/2016); ABDOMINAL AORTOGRAM (N/A, 08/11/2016); PERIPHERAL VASCULAR BALLOON ANGIOPLASTY (Left, 08/11/2016); ABDOMINAL AORTOGRAM W/LOWER EXTREMITY (N/A, 08/15/2016); Amputation (Right, 08/17/2016); Amputation (Right, 09/12/2016); Application if  wound vac (Right, 09/12/2016); Amputation (Left, 08/12/2016); and Amputation (Left, 11/01/2017).   His family history includes Diabetes in his maternal aunt and maternal uncle; Heart disease in his mother; Pneumonia in his father.He reports that he has been smoking cigarettes. He has a 11.25 pack-year smoking history. He has never used smokeless tobacco. He reports previous alcohol use. He reports that he does not use drugs.    ROS Review of Systems  Constitutional: Negative for activity change, appetite change and fever.  HENT: Negative.   Respiratory: Negative for shortness of breath.   Cardiovascular: Negative for chest pain.  Gastrointestinal: Negative for abdominal pain.  Musculoskeletal: Positive for back pain.    Objective:  BP 127/70   Pulse 61   Temp (!) 97.5 F (36.4 C) (Temporal)   BP Readings from Last 3 Encounters:  10/02/19 127/70  06/17/19 (!) 176/90  05/01/19 (!) 163/82    Wt Readings from Last 3 Encounters:  02/15/18 134 lb 14.7 oz (61.2 kg)  02/05/18 138 lb 14.2 oz (63 kg)  11/30/17 145 lb (65.8 kg)     Physical Exam Vitals reviewed.  Constitutional:      Appearance: He is well-developed.  HENT:     Head: Normocephalic and atraumatic.     Right Ear: External ear normal.     Left Ear: External ear normal.     Mouth/Throat:     Pharynx: No oropharyngeal exudate or posterior oropharyngeal erythema.  Eyes:     Pupils: Pupils are equal, round, and reactive to light.  Cardiovascular:     Rate and Rhythm: Normal rate and regular rhythm.     Heart sounds: No murmur.  Pulmonary:  Effort: No respiratory distress.     Breath sounds: Normal breath sounds.  Abdominal:     General: Abdomen is flat. Bowel sounds are normal.     Palpations: Abdomen is soft.  Musculoskeletal:        General: Tenderness (Marked tenderness at the right ischial tuberosity.  There is one slight skin breakdown at the left buttocks near the gluteal fold.  This is a 3 mm x 2 cm grade  1.  It is treated with simple dressing of Neosporin and gauze) present.     Cervical back: Normal range of motion and neck supple.  Neurological:     Mental Status: He is alert and oriented to person, place, and time.    Results for orders placed or performed in visit on 09/30/19  Bayer DCA Hb A1c Waived  Result Value Ref Range   HB A1C (BAYER DCA - WAIVED) 6.8 <7.0 %  Lipid panel  Result Value Ref Range   Cholesterol, Total 109 100 - 199 mg/dL   Triglycerides 179 (H) 0 - 149 mg/dL   HDL 27 (L) >39 mg/dL   VLDL Cholesterol Cal 30 5 - 40 mg/dL   LDL Chol Calc (NIH) 52 0 - 99 mg/dL   Chol/HDL Ratio 4.0 0.0 - 5.0 ratio  Thyroid Panel With TSH  Result Value Ref Range   TSH 0.255 (L) 0.450 - 4.500 uIU/mL   T4, Total 14.1 (H) 4.5 - 12.0 ug/dL   T3 Uptake Ratio 34 24 - 39 %   Free Thyroxine Index 4.8 1.2 - 4.9  VITAMIN D 25 Hydroxy (Vit-D Deficiency, Fractures)  Result Value Ref Range   Vit D, 25-Hydroxy 52.9 30.0 - 100.0 ng/mL  CMP14+EGFR  Result Value Ref Range   Glucose 131 (H) 65 - 99 mg/dL   BUN 16 8 - 27 mg/dL   Creatinine, Ser 1.27 0.76 - 1.27 mg/dL   GFR calc non Af Amer 59 (L) >59 mL/min/1.73   GFR calc Af Amer 69 >59 mL/min/1.73   BUN/Creatinine Ratio 13 10 - 24   Sodium 131 (L) 134 - 144 mmol/L   Potassium 4.5 3.5 - 5.2 mmol/L   Chloride 98 96 - 106 mmol/L   CO2 17 (L) 20 - 29 mmol/L   Calcium 8.8 8.6 - 10.2 mg/dL   Total Protein 8.2 6.0 - 8.5 g/dL   Albumin 3.8 3.8 - 4.8 g/dL   Globulin, Total 4.4 1.5 - 4.5 g/dL   Albumin/Globulin Ratio 0.9 (L) 1.2 - 2.2   Bilirubin Total 0.4 0.0 - 1.2 mg/dL   Alkaline Phosphatase 168 (H) 39 - 117 IU/L   AST 55 (H) 0 - 40 IU/L   ALT 31 0 - 44 IU/L  CBC with Differential/Platelet  Result Value Ref Range   WBC 8.5 3.4 - 10.8 x10E3/uL   RBC 4.95 4.14 - 5.80 x10E6/uL   Hemoglobin 14.1 13.0 - 17.7 g/dL   Hematocrit 42.8 37.5 - 51.0 %   MCV 87 79 - 97 fL   MCH 28.5 26.6 - 33.0 pg   MCHC 32.9 31.5 - 35.7 g/dL   RDW 15.1 11.6 -  15.4 %   Platelets 353 150 - 450 x10E3/uL   Neutrophils 51 Not Estab. %   Lymphs 36 Not Estab. %   Monocytes 7 Not Estab. %   Eos 4 Not Estab. %   Basos 1 Not Estab. %   Neutrophils Absolute 4.4 1.4 - 7.0 x10E3/uL   Lymphocytes Absolute 3.1 0.7 - 3.1  x10E3/uL   Monocytes Absolute 0.6 0.1 - 0.9 x10E3/uL   EOS (ABSOLUTE) 0.3 0.0 - 0.4 x10E3/uL   Basophils Absolute 0.1 0.0 - 0.2 x10E3/uL   Immature Granulocytes 1 Not Estab. %   Immature Grans (Abs) 0.0 0.0 - 0.1 x10E3/uL  T4, free  Result Value Ref Range   Free T4 2.10 (H) 0.82 - 1.77 ng/dL      Assessment & Plan:   Todd [name] was seen today for recurrent skin infections.  Diagnoses and all orders for this visit:  Pressure sore of left ischial area, stage I  Type 2 diabetes mellitus with stage 4 chronic kidney disease, with long-term current use of insulin (HCC)       I am having Todd [name] maintain his Insulin Pen Needle, blood glucose meter kit and supplies, folic acid, glucose blood, aspirin EC, metoprolol succinate, Lantus SoloStar, sertraline, cetirizine, megestrol, atorvastatin, and levothyroxine.  Allergies as of 10/02/2019      Reactions   Lisinopril Other (See Comments)   Hyperkalemia / Renal failure      Medication List       Accurate as of October 02, 2019  8:37 PM. If you have any questions, ask your nurse or doctor.        aspirin EC 81 MG tablet Take 81 mg by mouth daily.   atorvastatin 40 MG tablet Commonly known as: LIPITOR Take 1 tablet (40 mg total) by mouth daily at 6 PM. Needs to be seen for further refills.   blood glucose meter kit and supplies Dispense based on patient and insurance preference. Use up to four times daily as directed. (FOR ICD-10 E10.9, E11.9).   cetirizine 10 MG tablet Commonly known as: ZYRTEC Take 1 tablet (10 mg total) by mouth daily. For allergy symptoms   folic acid 1 MG tablet Commonly known as: FOLVITE Take 1 tablet (1 mg total) by mouth daily.   glucose  blood test strip Commonly known as: Accu-Chek Aviva Plus Check blood sugars four times a day   Insulin Pen Needle 31G X 5 MM Misc 1 Device by Does not apply route as directed.   Lantus SoloStar 100 UNIT/ML Solostar Pen Generic drug: insulin glargine Inject 50 Units into the skin at bedtime.   levothyroxine 100 MCG tablet Commonly known as: SYNTHROID Take 1 tablet (100 mcg total) by mouth daily before breakfast.   megestrol 400 MG/10ML suspension Commonly known as: MEGACE Take 10 mLs (400 mg total) by mouth 2 (two) times daily. For appetite stimulation   metoprolol succinate 100 MG 24 hr tablet Commonly known as: TOPROL-XL Take 1 tablet (100 mg total) by mouth daily. Take with or immediately following a meal.   sertraline 50 MG tablet Commonly known as: ZOLOFT Take 1 tablet (50 mg total) by mouth at bedtime. For anxiety and depression        Follow-up: Return in about 3 months (around 01/01/2020), or if symptoms worsen or fail to improve.  [name], M.D.

## 2019-10-06 ENCOUNTER — Other Ambulatory Visit: Payer: Self-pay | Admitting: "Endocrinology

## 2019-10-06 DIAGNOSIS — Z89611 Acquired absence of right leg above knee: Secondary | ICD-10-CM | POA: Diagnosis not present

## 2019-10-06 DIAGNOSIS — Z89612 Acquired absence of left leg above knee: Secondary | ICD-10-CM | POA: Diagnosis not present

## 2019-10-08 DIAGNOSIS — Z4781 Encounter for orthopedic aftercare following surgical amputation: Secondary | ICD-10-CM | POA: Diagnosis not present

## 2019-10-08 DIAGNOSIS — E1122 Type 2 diabetes mellitus with diabetic chronic kidney disease: Secondary | ICD-10-CM | POA: Diagnosis not present

## 2019-10-08 DIAGNOSIS — F4321 Adjustment disorder with depressed mood: Secondary | ICD-10-CM | POA: Diagnosis not present

## 2019-10-08 DIAGNOSIS — F419 Anxiety disorder, unspecified: Secondary | ICD-10-CM | POA: Diagnosis not present

## 2019-10-08 DIAGNOSIS — E039 Hypothyroidism, unspecified: Secondary | ICD-10-CM | POA: Diagnosis not present

## 2019-10-08 DIAGNOSIS — I129 Hypertensive chronic kidney disease with stage 1 through stage 4 chronic kidney disease, or unspecified chronic kidney disease: Secondary | ICD-10-CM | POA: Diagnosis not present

## 2019-10-08 DIAGNOSIS — N189 Chronic kidney disease, unspecified: Secondary | ICD-10-CM | POA: Diagnosis not present

## 2019-10-08 DIAGNOSIS — F329 Major depressive disorder, single episode, unspecified: Secondary | ICD-10-CM | POA: Diagnosis not present

## 2019-10-08 DIAGNOSIS — E785 Hyperlipidemia, unspecified: Secondary | ICD-10-CM | POA: Diagnosis not present

## 2019-10-14 DIAGNOSIS — F329 Major depressive disorder, single episode, unspecified: Secondary | ICD-10-CM | POA: Diagnosis not present

## 2019-10-14 DIAGNOSIS — E039 Hypothyroidism, unspecified: Secondary | ICD-10-CM | POA: Diagnosis not present

## 2019-10-14 DIAGNOSIS — I129 Hypertensive chronic kidney disease with stage 1 through stage 4 chronic kidney disease, or unspecified chronic kidney disease: Secondary | ICD-10-CM | POA: Diagnosis not present

## 2019-10-14 DIAGNOSIS — E1122 Type 2 diabetes mellitus with diabetic chronic kidney disease: Secondary | ICD-10-CM | POA: Diagnosis not present

## 2019-10-14 DIAGNOSIS — Z4781 Encounter for orthopedic aftercare following surgical amputation: Secondary | ICD-10-CM | POA: Diagnosis not present

## 2019-10-14 DIAGNOSIS — N189 Chronic kidney disease, unspecified: Secondary | ICD-10-CM | POA: Diagnosis not present

## 2019-10-14 DIAGNOSIS — F4321 Adjustment disorder with depressed mood: Secondary | ICD-10-CM | POA: Diagnosis not present

## 2019-10-14 DIAGNOSIS — E785 Hyperlipidemia, unspecified: Secondary | ICD-10-CM | POA: Diagnosis not present

## 2019-10-14 DIAGNOSIS — F419 Anxiety disorder, unspecified: Secondary | ICD-10-CM | POA: Diagnosis not present

## 2019-10-15 ENCOUNTER — Ambulatory Visit (INDEPENDENT_AMBULATORY_CARE_PROVIDER_SITE_OTHER): Payer: Medicare HMO | Admitting: Licensed Clinical Social Worker

## 2019-10-15 DIAGNOSIS — N184 Chronic kidney disease, stage 4 (severe): Secondary | ICD-10-CM

## 2019-10-15 DIAGNOSIS — E1122 Type 2 diabetes mellitus with diabetic chronic kidney disease: Secondary | ICD-10-CM | POA: Diagnosis not present

## 2019-10-15 DIAGNOSIS — N183 Chronic kidney disease, stage 3 unspecified: Secondary | ICD-10-CM

## 2019-10-15 DIAGNOSIS — Z794 Long term (current) use of insulin: Secondary | ICD-10-CM

## 2019-10-15 DIAGNOSIS — I10 Essential (primary) hypertension: Secondary | ICD-10-CM | POA: Diagnosis not present

## 2019-10-15 DIAGNOSIS — E782 Mixed hyperlipidemia: Secondary | ICD-10-CM | POA: Diagnosis not present

## 2019-10-15 NOTE — Patient Instructions (Addendum)
Licensed Clinical Social Worker Visit Information  Goals we discussed today:  Goals    . Client wants to talk with LCSW in next 30 days to discuss grief issues of clinet related to recent death of his mother (his mother died on 06-10-19) (pt-stated)     Current Barriers:  [name] Mobility challenges in client with Chronic Diagnoses of HTN, Type 2 DM, CKD, HLD  Clinical Social Work Clinical Goal(s):  [name] LCSW will talk with client in next 30 days to discuss client management of grief issues faced  Interventions: Talked with client about mobility challenges of client  Talked with client about appetite of client  Talked with client about sleeping issues of client  Talked with Jsean about relaxation techniques of client (enjoys watching TV,enjoys visits with his sister who travels to see client, enjoys phone calls with his sister)  Talked with Savio about in home physical therapy sessions he is receiving Talked with client about client use of hospital bed  Talked with Makenzie about pain issues faced  Talked with client about his upcoming medical appointment (client has appointemnt with Dr. Dorris Fetch on 10/25/2019)  Patient Self Care Activities:  Takes medications as prescribed   Self Care Deficit:    Mobility challenges Alcohol use issue     Initial goal documentation    Materials Provided: No  Follow Up Plan:LCSW to call client or sister, [name] in next 4 weeks to talk about client grief issues faced  The patient verbalized understanding of instructions provided today and declined a print copy of patient instruction materials.   [name].Quinci Gavidia MSW, LCSW Licensed Clinical Social Worker [facility] [phone]

## 2019-10-15 NOTE — Chronic Care Management (AMB) (Signed)
Chronic Care Management    Clinical Social Work Follow Up Note  10/15/2019 Name: Todd [name] MRN: [id number] DOB: August 23, 1955  Todd [name] is a 64 y.o. year old male who is a primary care patient of [name], [name], MD. The CCM team was consulted for assistance with Todd [name] .   Review of patient status, including review of consultants reports, other relevant assessments, and collaboration with appropriate care team members and the patient's provider was performed as part of comprehensive patient evaluation and provision of chronic care management services.    SDOH (Social Determinants of Health) assessments performed: Yes;risk of social isolation; risk of tobacco use; risk of stress; risk of physical inactivity    Chronic Care Management from 07/03/2019 in Todd [name]  PHQ-9 Total Score  6     GAD 7 : Generalized Anxiety Score 07/03/2019  Nervous, Anxious, on Edge 1  Control/stop worrying 0  Worry too much - different things 1  Trouble relaxing 0  Restless 1  Easily annoyed or irritable 1  Afraid - awful might happen 1  Total GAD 7 Score 5  Anxiety Difficulty Somewhat difficult    Outpatient Encounter Medications as of 10/15/2019  Medication Sig  . aspirin EC 81 MG tablet Take 81 mg by mouth daily.  [name] atorvastatin (LIPITOR) 40 MG tablet Take 1 tablet (40 mg total) by mouth daily at 6 PM. Needs to be seen for further refills.  . blood glucose meter kit and supplies Dispense based on patient and insurance preference. Use up to four times daily as directed. (FOR ICD-10 E10.9, E11.9).  . cetirizine (ZYRTEC) 10 MG tablet Take 1 tablet (10 mg total) by mouth daily. For allergy symptoms  . folic acid (FOLVITE) 1 MG tablet Take 1 tablet (1 mg total) by mouth daily.  [name] glucose blood (ACCU-CHEK AVIVA PLUS) test strip Check blood sugars four times a day  . Insulin Glargine (LANTUS SOLOSTAR) 100 UNIT/ML Solostar Pen Inject 50 Units into the skin at  bedtime.  . Insulin Pen Needle 31G X 5 MM MISC 1 Device by Does not apply route as directed.  [name] levothyroxine (SYNTHROID) 100 MCG tablet Take 1 tablet (100 mcg total) by mouth daily before breakfast.  . megestrol (MEGACE) 400 MG/10ML suspension Take 10 mLs (400 mg total) by mouth 2 (two) times daily. For appetite stimulation  . metoprolol succinate (TOPROL-XL) 100 MG 24 hr tablet Take 1 tablet (100 mg total) by mouth daily. Take with or immediately following a meal.  . sertraline (ZOLOFT) 50 MG tablet Take 1 tablet (50 mg total) by mouth at bedtime. For anxiety and depression  . Vitamin D, Ergocalciferol, (DRISDOL) 1.25 MG (50000 UNIT) CAPS capsule Take 1 capsule by mouth once a week   No facility-administered encounter medications on file as of 10/15/2019.    Goals    . Client wants to talk with Todd [name] in next 30 days to discuss grief issues of clinet related to recent death of his mother (his mother died on 06-12-19) (pt-stated)     Current Barriers:  [name] Mobility challenges in client with Chronic Diagnoses of HTN, Type 2 DM, CKD, HLD  Clinical Social Work Clinical Goal(s):  [name] Todd [name] will talk with client in next 30 days to discuss client management of grief issues faced  Interventions:  Talked withclientabout mobility challenges of client  Talked with client about appetite of client  Talked with client about sleeping issues of client  Talked with Todd [name] about relaxation techniques of  client (enjoys watching TV,enjoys visits with his sister who travels to see client, enjoys phone calls with his sister)  Talked with Todd [name] about in home physical therapy sessions he is receiving  Talked with client about client use of hospital bed   Talked with Todd [name] about pain issues faced  Talked with client about his upcoming medical appointment (client has appointemnt with Todd [name] on 10/25/2019)  Patient Self Care Activities:  Takes medications as prescribed   Self Care Deficit:      Mobility challenges Alcohol use issue    Initial goal documentation      Follow Up Plan:Todd [name] to call client or sister, Todd [name] in next 4 weeks to talk about client grief issues faced  Todd [name].Todd [name] MSW, Todd [name] Licensed Clinical Social Worker [facility] [phone]

## 2019-10-22 ENCOUNTER — Telehealth: Payer: Self-pay | Admitting: Family Medicine

## 2019-10-22 DIAGNOSIS — E785 Hyperlipidemia, unspecified: Secondary | ICD-10-CM | POA: Diagnosis not present

## 2019-10-22 DIAGNOSIS — F329 Major depressive disorder, single episode, unspecified: Secondary | ICD-10-CM | POA: Diagnosis not present

## 2019-10-22 DIAGNOSIS — F4321 Adjustment disorder with depressed mood: Secondary | ICD-10-CM | POA: Diagnosis not present

## 2019-10-22 DIAGNOSIS — Z4781 Encounter for orthopedic aftercare following surgical amputation: Secondary | ICD-10-CM | POA: Diagnosis not present

## 2019-10-22 DIAGNOSIS — N189 Chronic kidney disease, unspecified: Secondary | ICD-10-CM | POA: Diagnosis not present

## 2019-10-22 DIAGNOSIS — I129 Hypertensive chronic kidney disease with stage 1 through stage 4 chronic kidney disease, or unspecified chronic kidney disease: Secondary | ICD-10-CM | POA: Diagnosis not present

## 2019-10-22 DIAGNOSIS — E039 Hypothyroidism, unspecified: Secondary | ICD-10-CM | POA: Diagnosis not present

## 2019-10-22 DIAGNOSIS — F419 Anxiety disorder, unspecified: Secondary | ICD-10-CM | POA: Diagnosis not present

## 2019-10-22 DIAGNOSIS — E1122 Type 2 diabetes mellitus with diabetic chronic kidney disease: Secondary | ICD-10-CM | POA: Diagnosis not present

## 2019-10-22 NOTE — Telephone Encounter (Signed)
Not urgent. Continue to follow.

## 2019-10-22 NOTE — Telephone Encounter (Signed)
Todd [name] advised to monitor and let us know if blood pressure continues to be elevated.

## 2019-10-25 ENCOUNTER — Ambulatory Visit: Payer: Medicare HMO | Admitting: "Endocrinology

## 2019-10-27 DIAGNOSIS — Z89611 Acquired absence of right leg above knee: Secondary | ICD-10-CM | POA: Diagnosis not present

## 2019-10-27 DIAGNOSIS — Z89612 Acquired absence of left leg above knee: Secondary | ICD-10-CM | POA: Diagnosis not present

## 2019-11-05 DIAGNOSIS — Z89611 Acquired absence of right leg above knee: Secondary | ICD-10-CM | POA: Diagnosis not present

## 2019-11-05 DIAGNOSIS — Z89612 Acquired absence of left leg above knee: Secondary | ICD-10-CM | POA: Diagnosis not present

## 2019-11-08 ENCOUNTER — Other Ambulatory Visit: Payer: Self-pay | Admitting: Family Medicine

## 2019-11-08 ENCOUNTER — Telehealth: Payer: Self-pay | Admitting: *Deleted

## 2019-11-08 DIAGNOSIS — Z4781 Encounter for orthopedic aftercare following surgical amputation: Secondary | ICD-10-CM | POA: Diagnosis not present

## 2019-11-08 DIAGNOSIS — F4321 Adjustment disorder with depressed mood: Secondary | ICD-10-CM | POA: Diagnosis not present

## 2019-11-08 DIAGNOSIS — F419 Anxiety disorder, unspecified: Secondary | ICD-10-CM | POA: Diagnosis not present

## 2019-11-08 DIAGNOSIS — E1122 Type 2 diabetes mellitus with diabetic chronic kidney disease: Secondary | ICD-10-CM | POA: Diagnosis not present

## 2019-11-08 DIAGNOSIS — L89151 Pressure ulcer of sacral region, stage 1: Secondary | ICD-10-CM

## 2019-11-08 DIAGNOSIS — I129 Hypertensive chronic kidney disease with stage 1 through stage 4 chronic kidney disease, or unspecified chronic kidney disease: Secondary | ICD-10-CM | POA: Diagnosis not present

## 2019-11-08 DIAGNOSIS — E785 Hyperlipidemia, unspecified: Secondary | ICD-10-CM | POA: Diagnosis not present

## 2019-11-08 DIAGNOSIS — E039 Hypothyroidism, unspecified: Secondary | ICD-10-CM | POA: Diagnosis not present

## 2019-11-08 DIAGNOSIS — F329 Major depressive disorder, single episode, unspecified: Secondary | ICD-10-CM | POA: Diagnosis not present

## 2019-11-08 DIAGNOSIS — N189 Chronic kidney disease, unspecified: Secondary | ICD-10-CM | POA: Diagnosis not present

## 2019-11-08 NOTE — Telephone Encounter (Signed)
Pt needs a gel wheelchair cushion asap. Please print order and give to [facility] to send to [employer]. This is 2nd request per [facility]. Pt does have skin breakdown.

## 2019-11-08 NOTE — Telephone Encounter (Signed)
The order was printed.  It should be on the printer next so the vaccination refrigerator.  We will have to get the on-call provider to sign for me since I am off today.  There should be somebody covering for me at least.

## 2019-11-08 NOTE — Telephone Encounter (Signed)
Order signed and will be sent to [employer], Mallory aware.

## 2019-11-12 ENCOUNTER — Other Ambulatory Visit: Payer: Self-pay | Admitting: Family Medicine

## 2019-11-12 DIAGNOSIS — E1122 Type 2 diabetes mellitus with diabetic chronic kidney disease: Secondary | ICD-10-CM | POA: Diagnosis not present

## 2019-11-12 DIAGNOSIS — F4321 Adjustment disorder with depressed mood: Secondary | ICD-10-CM | POA: Diagnosis not present

## 2019-11-12 DIAGNOSIS — F419 Anxiety disorder, unspecified: Secondary | ICD-10-CM | POA: Diagnosis not present

## 2019-11-12 DIAGNOSIS — I129 Hypertensive chronic kidney disease with stage 1 through stage 4 chronic kidney disease, or unspecified chronic kidney disease: Secondary | ICD-10-CM | POA: Diagnosis not present

## 2019-11-12 DIAGNOSIS — Z4781 Encounter for orthopedic aftercare following surgical amputation: Secondary | ICD-10-CM | POA: Diagnosis not present

## 2019-11-12 DIAGNOSIS — E039 Hypothyroidism, unspecified: Secondary | ICD-10-CM | POA: Diagnosis not present

## 2019-11-12 DIAGNOSIS — F329 Major depressive disorder, single episode, unspecified: Secondary | ICD-10-CM | POA: Diagnosis not present

## 2019-11-12 DIAGNOSIS — E785 Hyperlipidemia, unspecified: Secondary | ICD-10-CM | POA: Diagnosis not present

## 2019-11-12 DIAGNOSIS — N189 Chronic kidney disease, unspecified: Secondary | ICD-10-CM | POA: Diagnosis not present

## 2019-11-18 ENCOUNTER — Ambulatory Visit (INDEPENDENT_AMBULATORY_CARE_PROVIDER_SITE_OTHER): Payer: Medicare HMO | Admitting: Licensed Clinical Social Worker

## 2019-11-18 DIAGNOSIS — E782 Mixed hyperlipidemia: Secondary | ICD-10-CM

## 2019-11-18 DIAGNOSIS — E1122 Type 2 diabetes mellitus with diabetic chronic kidney disease: Secondary | ICD-10-CM | POA: Diagnosis not present

## 2019-11-18 DIAGNOSIS — Z794 Long term (current) use of insulin: Secondary | ICD-10-CM | POA: Diagnosis not present

## 2019-11-18 DIAGNOSIS — N184 Chronic kidney disease, stage 4 (severe): Secondary | ICD-10-CM

## 2019-11-18 DIAGNOSIS — I10 Essential (primary) hypertension: Secondary | ICD-10-CM

## 2019-11-18 DIAGNOSIS — N183 Chronic kidney disease, stage 3 unspecified: Secondary | ICD-10-CM

## 2019-11-18 NOTE — Patient Instructions (Addendum)
Licensed Clinical Social Worker Visit Information  Goals we discussed today:  Goals    . Client wants to talk with LCSW in next 30 days to discuss grief issues of clinet related to recent death of his mother (his mother died on Jun 15, 2019) (pt-stated)     Current Barriers:  [name] Mobility challenges in client with Chronic Diagnoses of HTN, Type 2 DM, CKD, HLD  Clinical Social Work Clinical Goal(s):  [name] LCSW will talk with client in next 30 days to discuss client management of grief issues faced  Interventions:  Talked with client about mobility challenges of client  Talked with client about appetite of client  Talked with client about sleeping issues of client  Talked with Seferino about relaxation techniques of client (enjoys watching TV,enjoys visits with his sister who travels to see client, enjoys phone calls with his sister)  Talked with Travaris about in home physical therapy sessions he is receiving Talked with client about client use of hospital bed  Talked with Dorman about pain issues faced  Talked with client about social support network (has support from [employer], sister)  Talked with client about food provision for client  Talked with client about his upcoming client appointments  Talked with [facility] about client request for Gel cushion for wheelchair  Patient Self Care Activities:  Takes medications as prescribed   Self Care Deficit:    Mobility challenges Alcohol use issue    Initial goal documentation      Materials Provided: No  Follow Up Plan: LCSW to call client or sister, [name] in next 4 weeks to talk about client grief issues faced  The patient verbalized understanding of instructions provided today and declined a print copy of patient instruction materials.   [name].Stefana Lodico MSW, LCSW Licensed Clinical Social Worker [facility] [phone]

## 2019-11-18 NOTE — Chronic Care Management (AMB) (Signed)
Chronic Care Management    Clinical Social Work Follow Up Note  11/18/2019 Name: Todd [name] MRN: [id number] DOB: Sep 21, 1955  Todd [name] is a 64 y.o. year old male who is a primary care patient of [name], [name], MD. The CCM team was consulted for assistance with [name] .   Review of patient status, including review of consultants reports, other relevant assessments, and collaboration with appropriate care team members and the patient's provider was performed as part of comprehensive patient evaluation and provision of chronic care management services.    SDOH (Social Determinants of Health) assessments performed: Yes;risk for social isolation; risk for tobacco use; risk for stress; risk for physical inactivity    Chronic Care Management from 07/03/2019 in [facility]  PHQ-9 Total Score  6      GAD 7 : Generalized Anxiety Score 07/03/2019  Nervous, Anxious, on Edge 1  Control/stop worrying 0  Worry too much - different things 1  Trouble relaxing 0  Restless 1  Easily annoyed or irritable 1  Afraid - awful might happen 1  Total GAD 7 Score 5  Anxiety Difficulty Somewhat difficult    Outpatient Encounter Medications as of 11/18/2019  Medication Sig  . aspirin EC 81 MG tablet Take 81 mg by mouth daily.  [name] atorvastatin (LIPITOR) 40 MG tablet Take 1 tablet (40 mg total) by mouth daily at 6 PM. Needs to be seen for further refills.  . blood glucose meter kit and supplies Dispense based on patient and insurance preference. Use up to four times daily as directed. (FOR ICD-10 E10.9, E11.9).  . cetirizine (ZYRTEC) 10 MG tablet Take 1 tablet (10 mg total) by mouth daily. For allergy symptoms  . folic acid (FOLVITE) 1 MG tablet Take 1 tablet (1 mg total) by mouth daily.  [name] glucose blood (ACCU-CHEK AVIVA PLUS) test strip Check blood sugars four times a day  . Insulin Glargine (LANTUS SOLOSTAR) 100 UNIT/ML Solostar Pen Inject 50 Units into the skin at  bedtime.  . Insulin Pen Needle 31G X 5 MM MISC 1 Device by Does not apply route as directed.  [name] levothyroxine (SYNTHROID) 100 MCG tablet Take 1 tablet (100 mcg total) by mouth daily before breakfast.  . megestrol (MEGACE) 400 MG/10ML suspension Take 10 mLs (400 mg total) by mouth 2 (two) times daily. For appetite stimulation  . metoprolol succinate (TOPROL-XL) 100 MG 24 hr tablet TAKE 1 TABLET BY MOUTH ONCE DAILY WITH MEALS OR  IMMEDIATELY  FOLLOWING  . sertraline (ZOLOFT) 50 MG tablet Take 1 tablet (50 mg total) by mouth at bedtime. For anxiety and depression  . Vitamin D, Ergocalciferol, (DRISDOL) 1.25 MG (50000 UNIT) CAPS capsule Take 1 capsule by mouth once a week   No facility-administered encounter medications on file as of 11/18/2019.    Goals    . Client wants to talk with LCSW in next 30 days to discuss grief issues of clinet related to recent death of his mother (his mother died on 06-21-19) (pt-stated)     Current Barriers:  [name] Mobility challenges in client with Chronic Diagnoses of HTN, Type 2 DM, CKD, HLD  Clinical Social Work Clinical Goal(s):  [name] LCSW will talk with client in next 30 days to discuss client management of grief issues faced  Interventions:  Talked withclientabout mobility challenges of client  Talked with client about appetite of client  Talked with client about sleeping issues of client  Talked with Onyekachi about relaxation techniques of client (  enjoys watching TV,enjoys visits with his sister who travels to see client, enjoys phone calls with his sister)  Talked with Veron about in home physical therapy sessions he is receiving  Talked with client about client use of hospital bed   Talked with Kashius about pain issues faced  Talked with client about social support network (has support from [employer], sister)  Talked with client about food provision for client  Talked with client  about his upcoming client appointments  Talked with [facility] about  client request for Gel cushion for wheelchair  Patient Self Care Activities:  Takes medications as prescribed   Self Care Deficit:    Mobility challenges Alcohol use issue    Initial goal documentation       Follow Up Plan:LCSW to call client or sister, [name] in next 4 weeks to talk about client grief issues faced  Delron Comer.Everard Interrante MSW, LCSW Licensed Clinical Social Worker [facility] [phone]

## 2019-11-25 ENCOUNTER — Ambulatory Visit (INDEPENDENT_AMBULATORY_CARE_PROVIDER_SITE_OTHER): Payer: Medicare HMO | Admitting: "Endocrinology

## 2019-11-25 ENCOUNTER — Other Ambulatory Visit: Payer: Self-pay

## 2019-11-25 ENCOUNTER — Encounter: Payer: Self-pay | Admitting: "Endocrinology

## 2019-11-25 VITALS — BP 140/78 | HR 64

## 2019-11-25 DIAGNOSIS — F17200 Nicotine dependence, unspecified, uncomplicated: Secondary | ICD-10-CM

## 2019-11-25 DIAGNOSIS — I10 Essential (primary) hypertension: Secondary | ICD-10-CM

## 2019-11-25 DIAGNOSIS — E039 Hypothyroidism, unspecified: Secondary | ICD-10-CM | POA: Diagnosis not present

## 2019-11-25 DIAGNOSIS — N184 Chronic kidney disease, stage 4 (severe): Secondary | ICD-10-CM | POA: Diagnosis not present

## 2019-11-25 DIAGNOSIS — E782 Mixed hyperlipidemia: Secondary | ICD-10-CM | POA: Diagnosis not present

## 2019-11-25 DIAGNOSIS — Z794 Long term (current) use of insulin: Secondary | ICD-10-CM | POA: Diagnosis not present

## 2019-11-25 DIAGNOSIS — E1122 Type 2 diabetes mellitus with diabetic chronic kidney disease: Secondary | ICD-10-CM

## 2019-11-25 DIAGNOSIS — E559 Vitamin D deficiency, unspecified: Secondary | ICD-10-CM | POA: Diagnosis not present

## 2019-11-25 MED ORDER — LANTUS SOLOSTAR 100 UNIT/ML ~~LOC~~ SOPN: 40.0000 [IU] | PEN_INJECTOR | Freq: Every day | SUBCUTANEOUS | 5 refills | Status: DC

## 2019-11-25 MED ORDER — LEVOTHYROXINE SODIUM 75 MCG PO TABS: 75.0000 ug | ORAL_TABLET | Freq: Every day | ORAL | 3 refills | Status: DC

## 2019-11-25 NOTE — Progress Notes (Signed)
11/25/2019, 11:51 AM        Endocrinology follow-up note      Subjective:    Patient ID: Todd [name], male    DOB: 04-01-1956.  Todd [name] is here to follow-up for management of currently uncontrolled symptomatic diabetes requested by  [name], MD.   Past Medical History:  Diagnosis Date  . AKI (acute kidney injury) ([place])   . Constipated   . Diabetes mellitus without complication ([place])   . Diarrhea   . Elevated LFTs   . Goiter   . Gout   . Hyperlipidemia   . Hypertension   . Leukocytosis   . Reactive thrombocytosis   . Right BKA infection ([place]) 08/2016  . Right leg pain   . Sepsis due to undetermined organism ([place])   . Thyroid disease   . Wound infection after surgery 08/2016    Past Surgical History:  Procedure Laterality Date  . ABDOMINAL AORTOGRAM N/A 08/11/2016   Procedure: Abdominal Aortogram;  Surgeon: Waynetta Sandy, MD;  Location: [facility];  Service: Cardiovascular;  Laterality: N/A;  . ABDOMINAL AORTOGRAM W/LOWER EXTREMITY N/A 08/15/2016   Procedure: Abdominal Aortogram w/Lower Extremity;  Surgeon: Elam Dutch, MD;  Location: [facility];  Service: Cardiovascular;  Laterality: N/A;  . AMPUTATION Right 08/17/2016   Procedure: RIGHT BELOW KNEE AMPUTATION;  Surgeon: Elam Dutch, MD;  Location: [facility];  Service: Vascular;  Laterality: Right;  . AMPUTATION Right 09/12/2016   Procedure: AMPUTATION ABOVE KNEE;  Surgeon: Newt Minion, MD;  Location: [facility];  Service: Orthopedics;  Laterality: Right;  . AMPUTATION Left 08/12/2016   Procedure: LEFT BELOW KNEE AMPUTATION;  Surgeon: Newt Minion, MD;  Location: [facility];  Service: Orthopedics;  Laterality: Left;  . AMPUTATION Left 11/01/2017   Procedure: LEFT ABOVE KNEE AMPUTATION;  Surgeon: Newt Minion, MD;  Location: [facility];  Service: Orthopedics;  Laterality: Left;  . APPLICATION OF WOUND VAC Right 09/12/2016    Procedure: APPLICATION OF WOUND VAC ABOVE KNEE;  Surgeon: Newt Minion, MD;  Location: [facility];  Service: Orthopedics;  Laterality: Right;  . LOWER EXTREMITY ANGIOGRAPHY Bilateral 08/11/2016   Procedure: Lower Extremity Angiography;  Surgeon: Waynetta Sandy, MD;  Location: [facility];  Service: Cardiovascular;  Laterality: Bilateral;  . PERIPHERAL VASCULAR BALLOON ANGIOPLASTY Left 08/11/2016   Procedure: Peripheral Vascular Balloon Angioplasty;  Surgeon: Waynetta Sandy, MD;  Location: [facility];  Service: Cardiovascular;  Laterality: Left;  SFA  . THYROID SURGERY      Social History   Socioeconomic History  . Marital status: Single    Spouse name: Not on file  . Number of children: Not on file  . Years of education: Not on file  . Highest education level: Not on file  Occupational History  . Occupation: drives a Forensic scientist  Tobacco Use  . Smoking status: Current Every Day Smoker    Packs/day: 0.25    Years: 45.00    Pack years: 11.25    Types: Cigarettes    Last attempt to quit: 11/12/2014    Years since quitting: 5.0  . Smokeless tobacco: Never Used  Vaping Use  . Vaping Use: Never used  Substance  and Sexual Activity  . Alcohol use: Not Currently    Alcohol/week: 0.0 standard drinks  . Drug use: No  . Sexual activity: Not Currently  Other Topics Concern  . Not on file  Social History Narrative  . Not on file   Social Determinants of Health   Financial Resource Strain: Low Risk   . Difficulty of Paying Living Expenses: Not very hard  Food Insecurity: No Food Insecurity  . Worried About Charity fundraiser in the Last Year: Never true  . Ran Out of Food in the Last Year: Never true  Transportation Needs: No Transportation Needs  . Lack of Transportation (Medical): No  . Lack of Transportation (Non-Medical): No  Physical Activity: Inactive  . Days of Exercise per Week: 0 days  . Minutes of Exercise per Session: 0 min  Stress: Stress  Concern Present  . Feeling of Stress : To some extent  Social Connections: Socially Isolated  . Frequency of Communication with Friends and Family: Three times a week  . Frequency of Social Gatherings with Friends and Family: Three times a week  . Attends Religious Services: Never  . Active Member of Clubs or Organizations: No  . Attends Archivist Meetings: Never  . Marital Status: Never married    Family History  Problem Relation Age of Onset  . Heart disease Mother   . Pneumonia Father   . Diabetes Maternal Aunt   . Diabetes Maternal Uncle     Outpatient Encounter Medications as of 11/25/2019  Medication Sig  . aspirin EC 81 MG tablet Take 81 mg by mouth daily.  [name] atorvastatin (LIPITOR) 40 MG tablet Take 1 tablet (40 mg total) by mouth daily at 6 PM. Needs to be seen for further refills.  . blood glucose meter kit and supplies Dispense based on patient and insurance preference. Use up to four times daily as directed. (FOR ICD-10 E10.9, E11.9).  . cetirizine (ZYRTEC) 10 MG tablet Take 1 tablet (10 mg total) by mouth daily. For allergy symptoms  . folic acid (FOLVITE) 1 MG tablet Take 1 tablet (1 mg total) by mouth daily.  [name] glucose blood (ACCU-CHEK AVIVA PLUS) test strip Check blood sugars four times a day  . insulin glargine (LANTUS SOLOSTAR) 100 UNIT/ML Solostar Pen Inject 40 Units into the skin at bedtime.  . Insulin Pen Needle 31G X 5 MM MISC 1 Device by Does not apply route as directed.  [name] levothyroxine (SYNTHROID) 75 MCG tablet Take 1 tablet (75 mcg total) by mouth daily before breakfast.  . megestrol (MEGACE) 400 MG/10ML suspension Take 10 mLs (400 mg total) by mouth 2 (two) times daily. For appetite stimulation  . metoprolol succinate (TOPROL-XL) 100 MG 24 hr tablet TAKE 1 TABLET BY MOUTH ONCE DAILY WITH MEALS OR  IMMEDIATELY  FOLLOWING  . sertraline (ZOLOFT) 50 MG tablet Take 1 tablet (50 mg total) by mouth at bedtime. For anxiety and depression  . Vitamin D,  Ergocalciferol, (DRISDOL) 1.25 MG (50000 UNIT) CAPS capsule Take 1 capsule by mouth once a week  . [DISCONTINUED] Insulin Glargine (LANTUS SOLOSTAR) 100 UNIT/ML Solostar Pen Inject 50 Units into the skin at bedtime.  . [DISCONTINUED] levothyroxine (SYNTHROID) 100 MCG tablet Take 1 tablet (100 mcg total) by mouth daily before breakfast.   No facility-administered encounter medications on file as of 11/25/2019.    ALLERGIES: Allergies  Allergen Reactions  . Lisinopril Other (See Comments)    Hyperkalemia / Renal failure    VACCINATION  STATUS: Immunization History  Administered Date(s) Administered  . Influenza,inj,Quad PF,6+ Mos 05/20/2015, 04/07/2017, 03/09/2018, 05/01/2019  . Influenza-Unspecified 05/10/2011  . PPD Test 09/16/2016    Diabetes He presents for his follow-up diabetic visit. He has type 2 diabetes mellitus. Onset time: Patient was diagnosed with A1c of greater than 14% in 2017 at the age of 51 years. His disease course has been improving. Pertinent negatives for hypoglycemia include no confusion, headaches, pallor or seizures. Associated symptoms include blurred vision. Pertinent negatives for diabetes include no chest pain, no fatigue, no polydipsia, no polyphagia, no polyuria and no weakness. There are no hypoglycemic complications. Symptoms are improving. Diabetic complications include nephropathy and PVD. 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 Current diabetic treatment includes insulin injections (He cannot commit to whether he is consistent on Lantus 60 units nightly.). He is compliant with treatment none of the time. He is following a generally unhealthy diet. When asked about meal planning, he reported none. He has not had a previous visit  with a dietitian. He never participates in exercise. His home blood glucose trend is decreasing steadily. (He did not bring any meter nor logs today.  He denies hypoglycemia.  His previsit labs show improved A1c of 6.8%, overall improving from 14% .) An ACE inhibitor/angiotensin II receptor blocker is not being taken.  Hyperlipidemia This is a chronic problem. The current episode started more than 1 year ago. The problem is uncontrolled. Exacerbating diseases include diabetes and hypothyroidism. Pertinent negatives include no chest pain, myalgias or shortness of breath. Current antihyperlipidemic treatment includes statins. Risk factors for coronary artery disease include diabetes mellitus, dyslipidemia and a sedentary lifestyle.  Thyroid Problem Presents for initial visit. Onset time: He reports thyroidectomy in 2013 due to benign nodular goiter. Patient reports no constipation, diarrhea, fatigue or palpitations. Past treatments include levothyroxine. The treatment provided moderate relief. Prior procedures include thyroidectomy. His past medical history is significant for diabetes and hyperlipidemia.   Review of systems: Limited as above.  Bilateral above  knee amputations,  wheelchair-bound.  Objective:    BP 140/78   Pulse 64   Wt Readings from Last 3 Encounters:  02/15/18 134 lb 14.7 oz (61.2 kg)  02/05/18 138 lb 14.2 oz (63 kg)  11/30/17 145 lb (65.8 kg)      Diabetic Labs (most recent): Lab Results  Component Value Date   HGBA1C 6.8 09/30/2019   HGBA1C 7.1 (A) 06/17/2019   HGBA1C 11.3 (H) 03/26/2019     Lipid Panel ( most recent) Lipid Panel     Component Value Date/Time   CHOL 109 09/30/2019 0810   TRIG 179 (H) 09/30/2019 0810   HDL 27 (L) 09/30/2019 0810   CHOLHDL 4.0 09/30/2019 0810   LDLCALC 52 09/30/2019 0810     Recent Results (from the past 2160 hour(s))  Bayer DCA Hb A1c Waived     Status: None   Collection Time: 09/30/19  8:10 AM  Result Value Ref Range    HB A1C (BAYER DCA - WAIVED) 6.8 <7.0 %    Comment:                                       Diabetic Adult            <  7.0                                       Healthy Adult        4.3 - 5.7                                                           (DCCT/NGSP) American Diabetes Association's Summary of Glycemic Recommendations for Adults with Diabetes: Hemoglobin A1c <7.0%. More stringent glycemic goals (A1c <6.0%) may further reduce complications at the cost of increased risk of hypoglycemia.   Lipid panel     Status: Abnormal   Collection Time: 09/30/19  8:10 AM  Result Value Ref Range   Cholesterol, Total 109 100 - 199 mg/dL   Triglycerides 179 (H) 0 - 149 mg/dL   HDL 27 (L) >39 mg/dL   VLDL Cholesterol Cal 30 5 - 40 mg/dL   LDL Chol Calc (NIH) 52 0 - 99 mg/dL   Chol/HDL Ratio 4.0 0.0 - 5.0 ratio    Comment:                                   T. Chol/HDL Ratio                                             Men  Women                               1/2 Avg.Risk  3.4    3.3                                   Avg.Risk  5.0    4.4                                2X Avg.Risk  9.6    7.1                                3X Avg.Risk 23.4   11.0   Thyroid Panel With TSH     Status: Abnormal   Collection Time: 09/30/19  8:10 AM  Result Value Ref Range   TSH 0.255 (L) 0.450 - 4.500 uIU/mL   T4, Total 14.1 (H) 4.5 - 12.0 ug/dL   T3 Uptake Ratio 34 24 - 39 %   Free Thyroxine Index 4.8 1.2 - 4.9  VITAMIN D 25 Hydroxy (Vit-D Deficiency, Fractures)     Status: None   Collection Time: 09/30/19  8:10 AM  Result Value Ref Range   Vit D, 25-Hydroxy 52.9 30.0 - 100.0 ng/mL    Comment: Vitamin D deficiency has been defined by the [place] and an Endocrine Society practice guideline as a level of serum 25-OH vitamin D less than 20 ng/mL (1,2). The Endocrine Society  went on to further define vitamin D insufficiency as a level between 21 and 29 ng/mL (2). 1. IOM (Institute of Medicine). 2010.  Dietary reference    intakes for calcium and D. [place]: The    [employer]. 2. Holick MF, Binkley [place], Bischoff-Ferrari HA, et al.    Evaluation, treatment, and prevention of vitamin D    deficiency: an Endocrine Society clinical practice    guideline. JCEM. 2011 Jul; 96(7):1911-30.   CMP14+EGFR     Status: Abnormal   Collection Time: 09/30/19  8:10 AM  Result Value Ref Range   Glucose 131 (H) 65 - 99 mg/dL   BUN 16 8 - 27 mg/dL   Creatinine, Ser 1.27 0.76 - 1.27 mg/dL   GFR calc non Af Amer 59 (L) >59 mL/min/1.73   GFR calc Af Amer 69 >59 mL/min/1.73   BUN/Creatinine Ratio 13 10 - 24   Sodium 131 (L) 134 - 144 mmol/L   Potassium 4.5 3.5 - 5.2 mmol/L   Chloride 98 96 - 106 mmol/L   CO2 17 (L) 20 - 29 mmol/L   Calcium 8.8 8.6 - 10.2 mg/dL   Total Protein 8.2 6.0 - 8.5 g/dL   Albumin 3.8 3.8 - 4.8 g/dL   Globulin, Total 4.4 1.5 - 4.5 g/dL   Albumin/Globulin Ratio 0.9 (L) 1.2 - 2.2   Bilirubin Total 0.4 0.0 - 1.2 mg/dL   Alkaline Phosphatase 168 (H) 39 - 117 IU/L   AST 55 (H) 0 - 40 IU/L   ALT 31 0 - 44 IU/L  CBC with Differential/Platelet     Status: None   Collection Time: 09/30/19  8:10 AM  Result Value Ref Range   WBC 8.5 3.4 - 10.8 x10E3/uL   RBC 4.95 4.14 - 5.80 x10E6/uL   Hemoglobin 14.1 13.0 - 17.7 g/dL   Hematocrit 42.8 37.5 - 51.0 %   MCV 87 79 - 97 fL   MCH 28.5 26.6 - 33.0 pg   MCHC 32.9 31 - 35 g/dL   RDW 15.1 11.6 - 15.4 %   Platelets 353 150 - 450 x10E3/uL   Neutrophils 51 Not Estab. %   Lymphs 36 Not Estab. %   Monocytes 7 Not Estab. %   Eos 4 Not Estab. %   Basos 1 Not Estab. %   Neutrophils Absolute 4.4 1 - 7 x10E3/uL   Lymphocytes Absolute 3.1 0 - 3 x10E3/uL   Monocytes Absolute 0.6 0 - 0 x10E3/uL   EOS (ABSOLUTE) 0.3 0.0 - 0.4 x10E3/uL   Basophils Absolute 0.1 0 - 0 x10E3/uL   Immature Granulocytes 1 Not Estab. %   Immature Grans (Abs) 0.0 0.0 - 0.1 x10E3/uL  T4, free     Status: Abnormal   Collection Time: 09/30/19  8:10 AM   Result Value Ref Range   Free T4 2.10 (H) 0.82 - 1.77 ng/dL       Assessment & Plan:   1. Type 2 diabetes mellitus with stage 4 chronic kidney disease, with long-term current use of insulin ([facility])  - Todd [name] has currently uncontrolled symptomatic type 2 DM since 64 years of age.  He did not bring any meter nor logs today.  He denies hypoglycemia.  His previsit labs show improved A1c of 6.8%, overall improving from 14.3% .   -He reports improved glycemic profile, denies hypoglycemia.   -This patient has history of heavy alcohol use/abuse which might have contributed to the pathogenesis of his diabetes. -I had a long  discussion with him about the progressive nature of type 2 diabetes and the pathology behind its complications.   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 These are all discussed in detail with him.  - I have counseled him on diet management by adopting a carbohydrate restricted/protein rich diet.  - he  admits there is a room for improvement in his diet and drink choices. -  Suggestion is made for him to avoid simple carbohydrates  from his diet including Cakes, Sweet Desserts / Pastries, Ice Cream, Soda (diet and regular), Sweet Tea, Candies, Chips, Cookies, Sweet Pastries,  Store Bought Juices, Alcohol in Excess of  1-2 drinks a day, Artificial Sweeteners, Coffee Creamer, and "Sugar-free" Products. This will help patient to have stable blood glucose profile and potentially avoid unintended weight gain.   - I encouraged him to switch to  unprocessed or minimally processed complex starch and increased protein intake (animal or plant source), fruits, and vegetables.  - he is advised to stick to a routine mealtimes to eat 3  meals  a day and avoid unnecessary snacks ( to snack only to correct hypoglycemia).   - he has been scheduled with Jearld Fenton, RDN, CDE for individualized diabetes education.  - I have approached him with the following individualized plan to manage diabetes and patient agrees:   -Based on his presentation with improved A1c of 6.8%, and his limited social support, he will not be considered for prandial insulin for now.    -#1 priority in his care will be to avoid hypoglycemia.  He is advised to lower his Lantus to 40 units nightly, associated with monitoring of blood glucose at least twice daily-daily before breakfast and at bedtime.  - he is warned not to take insulin without proper monitoring per orders.  - he is encouraged to call clinic for blood glucose levels less than 70 or above 300 mg /dl. -He remains too high risk to give multiple daily injections of insulin.    -In this patient with high likelihood of pancreatic diabetes, tight glycemic profile is not advised as he may be lacking endogenous glucagon response which puts him at high risk for hypoglycemia.  - he is not a candidate for Metformin, SGLT2 inhibitors due to concurrent renal insufficiency. -He is not a candidate for incretin therapy due to his background history of alcoholism with possible risk of pancreatitis.   - Patient specific target  A1c;  LDL, HDL, Triglycerides, and  Waist Circumference were discussed in detail.  2) Blood Pressure /Hypertension: -His blood pressure is controlled to target.  He continues to smoke heavily.  He did not take his blood pressure medications this morning.  He is advised to Resume and  Continue Toprol 100 mg p.o. daily. He will be considered for low-dose losartan next visit.  3) Lipids/Hyperlipidemia:   Review of his recent lipid panel showed uncontrolled hypertriglyceridemia at 556.  He is advised to continue atorvastatin 40 mg p.o. nightly,  advised to continue fenofibrate 145 mg  p.o. daily at bedtime.  Side effects and precautions discussed with him.  4)  Weight/Diet: -   CDE Consult has been initiated . Exercise, and detailed carbohydrates information provided  -  detailed on discharge instructions.  He cannot exercise optimally, wheelchair-bound due to bilateral above-knee amputations.  5) postsurgical hypothyroidism -He underwent total thyroidectomy in 2013 for what appears to be benign nodular goiter. -Due to lab evidence of inadequate replacement, His levothyroxine was recently adjusted at 125 mcg p.o. daily before breakfast.     - We discussed about the correct intake of his thyroid hormone, on empty stomach at fasting, with water, separated by at least 30 minutes from breakfast and other medications,  and separated by more than 4 hours from calcium, iron, multivitamins, acid reflux medications (PPIs). -Patient is made aware of the fact that thyroid hormone replacement is needed for life, dose to be adjusted by periodic monitoring of thyroid function tests.    6) profound vitamin D deficiency: -He is advised to continue vitamin D 2 50,000 units weekly, and added vitamin D3 5000 units daily.   7) Chronic Care/Health Maintenance:  -he  is on  statin medications and  is encouraged to initiate and continue to follow up with Ophthalmology, Dentist,  Podiatrist at least yearly or according to recommendations, and advised to stay away from smoking. I have recommended yearly flu vaccine and pneumonia vaccine at least every 5 years;  and  sleep for at least 7 hours a day. -He cannot exercise optimally.  Patient does not have adequate social support, he has transportation concerns.  He may benefit from home assessment by Education officer, museum.  The patient was counseled on the dangers of tobacco use, and was advised to quit.  Reviewed strategies to maximize success, including removing cigarettes and smoking materials from environment.   - he is  advised to maintain close  follow up with [name], MD for primary care needs, as well as his other providers for optimal and coordinated care.   - Time spent on this patient care encounter:  35 min, of which > 50% was spent in  counseling and the rest reviewing his blood glucose logs , discussing his hypoglycemia and hyperglycemia episodes, reviewing his current and  previous labs / studies  ( including abstraction from other facilities) and medications  doses and developing a  long term treatment plan and documenting his care.   Please refer to Patient Instructions for Blood Glucose Monitoring and Insulin/Medications Dosing Guide"  in media tab for additional information. Please  also refer to " Patient Self Inventory" in the Media  tab for reviewed elements of pertinent patient history.  Todd [name] participated in the discussions, expressed understanding, and voiced agreement with the above plans.  All questions were answered to his satisfaction. he is encouraged to contact clinic should he have any questions or concerns prior to his return visit.   Follow up plan: - Return in about 3 months (around 02/25/2020) for Bring Meter and Logs- A1c in Office.  Glade Lloyd, MD [facility] [address] [place], [place] [address] Phone: [phone]  Fax: [phone]    11/25/2019, 11:51 AM  This note was partially dictated with voice recognition software. Similar sounding words can be transcribed inadequately or may not  be corrected upon review.

## 2019-11-25 NOTE — Patient Instructions (Signed)

## 2019-11-27 DIAGNOSIS — Z89611 Acquired absence of right leg above knee: Secondary | ICD-10-CM | POA: Diagnosis not present

## 2019-11-27 DIAGNOSIS — Z89612 Acquired absence of left leg above knee: Secondary | ICD-10-CM | POA: Diagnosis not present

## 2019-12-04 ENCOUNTER — Ambulatory Visit: Payer: Medicare HMO | Admitting: Physician Assistant

## 2019-12-06 ENCOUNTER — Encounter: Payer: Self-pay | Admitting: Physician Assistant

## 2019-12-06 ENCOUNTER — Other Ambulatory Visit: Payer: Self-pay

## 2019-12-06 ENCOUNTER — Ambulatory Visit (INDEPENDENT_AMBULATORY_CARE_PROVIDER_SITE_OTHER): Payer: Medicare HMO | Admitting: Physician Assistant

## 2019-12-06 VITALS — BP 131/86 | HR 59 | Temp 97.6°F

## 2019-12-06 DIAGNOSIS — L89151 Pressure ulcer of sacral region, stage 1: Secondary | ICD-10-CM

## 2019-12-06 DIAGNOSIS — Z89612 Acquired absence of left leg above knee: Secondary | ICD-10-CM | POA: Diagnosis not present

## 2019-12-06 DIAGNOSIS — Z89611 Acquired absence of right leg above knee: Secondary | ICD-10-CM | POA: Diagnosis not present

## 2019-12-06 NOTE — Patient Instructions (Signed)
Skin Care and Bowel Hygiene   Anyone who has frequent bowel movements, diarrhea, or bowel leakage (fecal incontinence) may experience soreness or skin irritation around the anal region.  Occasionally, the skin can become so inflamed that it breaks into open sores.  Prevent skin breakdown by following good skin care habits.  Cleaning and Washing Techniques After having a bowel movement, men and women should tighten their anal sphincter before wiping.  Women should always wipe from front to back to prevent fecal matter from getting into the urethra and vagina.   Tips for Cleaning and Washing . wipe from front to back towards the anus . always wipe gently with soft toilet paper, or ideally with moist toilet paper . wipe only once with each piece of toilet paper so as not to re-contaminate the area . wash in warm water alone or with a minimal amount of mild, fragrance-free soap . use non-biological washing powder . gently pat skin completely dry, avoiding rubbing . if drying the skin after washing is difficult or uncomfortable, try using a hairdryer on a low setting (use very carefully) . allow air to get to the irritated area for some part of every day . use protective skin creams containing zinc as recommended by your doctor  What To Avoid . baths with extra-hot water . soaking for long periods of time in the bathtub . disinfectants and antiseptics  . bath oils, bath salts, and talcum powder . using plastic pants, pads, and sheets, which cause sweating . scratching at the irritated area  Additional Tips . some people find that citrus and acidic foods cause or worsen skin irritation . eat a healthy, balanced diet that is high in fiber  . drink plenty of fluids . wear cotton underwear to allow the skin to breath . talk to your healthcare provider about further treatment options; persistent problems need medical attention

## 2019-12-06 NOTE — Progress Notes (Signed)
  Subjective:     Patient ID: Todd [name], male   DOB: 30-Jan-1956, 64 y.o.   MRN: [id number]  HPI Pt here due to possible infection to the buttocks area He is a double amputee and sits for prolonged periods of time Intermit issues with lesions to the buttocks area in the past Thinks sx have improved but here for eval Aide comes to house 3x/week  Review of Systems  Constitutional: Negative.   Skin: Positive for wound. Negative for color change and rash.       Objective:   Physical Exam Vitals and nursing note reviewed.  Constitutional:      General: He is not in acute distress.    Appearance: Normal appearance. He is normal weight. He is not ill-appearing or toxic-appearing.  Neurological:     Mental Status: He is alert.   Buttocks area checked today Healing lesion to the R lower buttocks No open wound noted No surrounding erythema, edema, or induration No area of drainage/fluct No other lesions noted to the sacrum/buttocks     Assessment:     1. Pressure injury of sacral region, stage 1        Plan:     Continue with padding Pt states they are getting a gel cushion Continue with regular care F/U prn

## 2019-12-20 ENCOUNTER — Telehealth: Payer: Self-pay

## 2019-12-27 DIAGNOSIS — Z89611 Acquired absence of right leg above knee: Secondary | ICD-10-CM | POA: Diagnosis not present

## 2019-12-27 DIAGNOSIS — Z89612 Acquired absence of left leg above knee: Secondary | ICD-10-CM | POA: Diagnosis not present

## 2020-01-01 ENCOUNTER — Ambulatory Visit: Payer: Medicare HMO | Admitting: Family Medicine

## 2020-01-05 DIAGNOSIS — Z89611 Acquired absence of right leg above knee: Secondary | ICD-10-CM | POA: Diagnosis not present

## 2020-01-05 DIAGNOSIS — Z89612 Acquired absence of left leg above knee: Secondary | ICD-10-CM | POA: Diagnosis not present

## 2020-01-17 DIAGNOSIS — L89321 Pressure ulcer of left buttock, stage 1: Secondary | ICD-10-CM | POA: Diagnosis not present

## 2020-01-17 DIAGNOSIS — L89151 Pressure ulcer of sacral region, stage 1: Secondary | ICD-10-CM | POA: Diagnosis not present

## 2020-01-23 ENCOUNTER — Ambulatory Visit (INDEPENDENT_AMBULATORY_CARE_PROVIDER_SITE_OTHER): Payer: Medicare HMO | Admitting: Licensed Clinical Social Worker

## 2020-01-23 ENCOUNTER — Telehealth: Payer: Self-pay | Admitting: *Deleted

## 2020-01-23 DIAGNOSIS — E782 Mixed hyperlipidemia: Secondary | ICD-10-CM

## 2020-01-23 DIAGNOSIS — N184 Chronic kidney disease, stage 4 (severe): Secondary | ICD-10-CM | POA: Diagnosis not present

## 2020-01-23 DIAGNOSIS — E1122 Type 2 diabetes mellitus with diabetic chronic kidney disease: Secondary | ICD-10-CM

## 2020-01-23 DIAGNOSIS — I10 Essential (primary) hypertension: Secondary | ICD-10-CM | POA: Diagnosis not present

## 2020-01-23 DIAGNOSIS — N183 Chronic kidney disease, stage 3 unspecified: Secondary | ICD-10-CM | POA: Diagnosis not present

## 2020-01-23 DIAGNOSIS — Z794 Long term (current) use of insulin: Secondary | ICD-10-CM

## 2020-01-23 NOTE — Chronic Care Management (AMB) (Signed)
Chronic Care Management    Clinical Social Work Follow Up Note  01/23/2020 Name: Ferrell Claiborne MRN: [id number] DOB: 07-13-1955  Kmari Halter is a 64 y.o. year old male who is a primary care patient of [name], [name], MD. The CCM team was consulted for assistance with [name] .   Review of patient status, including review of consultants reports, other relevant assessments, and collaboration with appropriate care team members and the patient's provider was performed as part of comprehensive patient evaluation and provision of chronic care management services.    SDOH (Social Determinants of Health) assessments performed: No;risk for social isolation ; risk for tobacco use; risk for stress; risk for physical inactivity    Chronic Care Management from 07/03/2019 in [place]  PHQ-9 Total Score 6       GAD 7 : Generalized Anxiety Score 07/03/2019  Nervous, Anxious, on Edge 1  Control/stop worrying 0  Worry too much - different things 1  Trouble relaxing 0  Restless 1  Easily annoyed or irritable 1  Afraid - awful might happen 1  Total GAD 7 Score 5  Anxiety Difficulty Somewhat difficult    Outpatient Encounter Medications as of 01/23/2020  Medication Sig  . aspirin EC 81 MG tablet Take 81 mg by mouth daily.  [name] atorvastatin (LIPITOR) 40 MG tablet Take 1 tablet (40 mg total) by mouth daily at 6 PM. Needs to be seen for further refills.  . blood glucose meter kit and supplies Dispense based on patient and insurance preference. Use up to four times daily as directed. (FOR ICD-10 E10.9, E11.9).  . cetirizine (ZYRTEC) 10 MG tablet Take 1 tablet (10 mg total) by mouth daily. For allergy symptoms  . folic acid (FOLVITE) 1 MG tablet Take 1 tablet (1 mg total) by mouth daily.  [name] glucose blood (ACCU-CHEK AVIVA PLUS) test strip Check blood sugars four times a day  . insulin glargine (LANTUS SOLOSTAR) 100 UNIT/ML Solostar Pen Inject 40 Units into the skin at  bedtime.  . Insulin Pen Needle 31G X 5 MM MISC 1 Device by Does not apply route as directed.  [name] levothyroxine (SYNTHROID) 75 MCG tablet Take 1 tablet (75 mcg total) by mouth daily before breakfast.  . megestrol (MEGACE) 400 MG/10ML suspension Take 10 mLs (400 mg total) by mouth 2 (two) times daily. For appetite stimulation  . metoprolol succinate (TOPROL-XL) 100 MG 24 hr tablet TAKE 1 TABLET BY MOUTH ONCE DAILY WITH MEALS OR  IMMEDIATELY  FOLLOWING  . sertraline (ZOLOFT) 50 MG tablet Take 1 tablet (50 mg total) by mouth at bedtime. For anxiety and depression  . Vitamin D, Ergocalciferol, (DRISDOL) 1.25 MG (50000 UNIT) CAPS capsule Take 1 capsule by mouth once a week   No facility-administered encounter medications on file as of 01/23/2020.     Goals    .  Client wants to talk with LCSW in next 30 days to discuss grief issues of clinet related to recent death of his mother (his mother died on 2019/06/25) (pt-stated)      Current Barriers:  [name] Mobility challenges in client with Chronic Diagnoses of HTN, Type 2 DM, CKD, HLD  Clinical Social Work Clinical Goal(s):  [name] LCSW will talk with client in next 30 days to discuss client management of grief issues faced  Interventions:  Talked with [name], sister of client about client grief issues over the recent death of his mother  Talked with [name], about client use of alcohol  and treatment program in area related to alcohol (ADS in [place], [place])  Talked with [name] about transport needs of client  Talked with [name] about mobility challenges of client  Talked with [name] about CCM support services   Talked with [name] about appetite of client  Talked with [name] about alcohol use of client  Talked with Scientist, physiological about client use of wheelchair  Talked with [name] about mood of client  Talked with [name] about her plans to visit client this weekend  Talked with [name] about client visit with endocrinologist, Dr. Dorris Fetch  Talked with [name] about client DME  (has wheelchair, is waiting on delivery of a Gel cushion)  Collaborated with [facility] Triage Nurse regarding nursing needs of client  Patient Self Care Activities:  Takes medications as prescribed   Self Care Deficit:    Mobility challenges Alcohol use issue    Initial goal documentation      Follow Up Plan: LCSW to call client or sister, [name] in next 4 weeks to talk about client grief issues faced  Tyrice Hewitt.Delonta Yohannes MSW, LCSW Licensed Clinical Social Worker [facility] Care Management [phone]

## 2020-01-23 NOTE — Patient Instructions (Addendum)
Licensed Clinical Social Worker Visit Information  Goals we discussed today:    .  Client wants to talk with LCSW in next 30 days to discuss grief issues of clinet related to recent death of his mother (his mother died on 2019-05-28) (pt-stated)        Current Barriers:   Mobility challenges in client with Chronic Diagnoses of HTN, Type 2 DM, CKD, HLD  Clinical Social Work Clinical Goal(s):   LCSW will talk with client in next 30 days to discuss client management of grief issues faced  Interventions:  Talked with DeanCox, sister of client about client grief issues over the recent death of his mother  Talked with [name], about client use of alcohol and treatment program in area related to alcohol (ADS in [place], [place])  Talked with [name] about transport needs of client  Talked with [name] about mobility challenges of client  Talked with Scientist, physiological about CCM support services   Talked with Scientist, physiological about appetite of client  Talked with Scientist, physiological about alcohol use of client  Talked with Scientist, physiological about client use of wheelchair  Talked with [name] about mood of client  Talked with Scientist, physiological about her plans to visit client this weekend  Talked with [name] about client visit with endocrinologist, Dr. Dorris Fetch  Talked with [name] about client DME (has wheelchair, is waiting on delivery of a Gel cushion)   Patient Self Care Activities: Takes medications as prescribed  Self Care Deficit:  Mobility challenges Alcohol use issue  Initial goal documentation     Follow Up Plan: LCSW to call client or sister, [name] in next 4 weeks to talk about client grief issues faced  Materials Provided: No  The patient/Dean Cox, sister of client, verbalized understanding of instructions provided today and declined a print copy of patient instruction materials.   [name].Seven Dollens MSW, LCSW Licensed Clinical Social Worker [facility] Care Management [phone]

## 2020-01-23 NOTE — Telephone Encounter (Signed)
Received call from [place] stating that he had spoken with patient's sister [name] Cox).  Per Nicki Reaper sister states that patient has received Gel Cushion and is helping with pressure sores but patient is having difficulties with appetite, patient currently prescribed megace but sister is unsure if patient is taking this medication or if patient is in need of refills.  Sister also states that patient seen Dr. Dorris Fetch and that Dr. Dorris Fetch had commented on that patient is non-compliant with diet, medication and lifestyle due to tobacco and alcohol use.  Patient does not have refills on megace at this time. Megace last prescribed on 07/09/2019.  Will send to pcp to see if he would like for patient to continue this medication

## 2020-01-24 ENCOUNTER — Other Ambulatory Visit: Payer: Self-pay | Admitting: Family Medicine

## 2020-01-24 MED ORDER — MEGESTROL ACETATE 400 MG/10ML PO SUSP: 400.0000 mg | Freq: Two times a day (BID) | ORAL | 2 refills | Status: DC

## 2020-01-24 NOTE — Telephone Encounter (Signed)
Dr. Livia Snellen sent in refills of Megace today.

## 2020-01-24 NOTE — Telephone Encounter (Signed)
Pt. Should continue the medication. I have no idea how to help with compliance. It has always been an issue.I have gently encouraged him on multiple occasions. I am open to suggestions.

## 2020-01-27 ENCOUNTER — Ambulatory Visit (INDEPENDENT_AMBULATORY_CARE_PROVIDER_SITE_OTHER): Payer: Medicare HMO | Admitting: Family Medicine

## 2020-01-27 ENCOUNTER — Encounter: Payer: Self-pay | Admitting: Family Medicine

## 2020-01-27 ENCOUNTER — Other Ambulatory Visit: Payer: Self-pay

## 2020-01-27 VITALS — BP 113/78 | HR 70 | Temp 97.5°F | Resp 20

## 2020-01-27 DIAGNOSIS — Z89612 Acquired absence of left leg above knee: Secondary | ICD-10-CM | POA: Diagnosis not present

## 2020-01-27 DIAGNOSIS — Z89611 Acquired absence of right leg above knee: Secondary | ICD-10-CM | POA: Diagnosis not present

## 2020-01-27 DIAGNOSIS — E039 Hypothyroidism, unspecified: Secondary | ICD-10-CM

## 2020-01-27 DIAGNOSIS — Z794 Long term (current) use of insulin: Secondary | ICD-10-CM

## 2020-01-27 DIAGNOSIS — E1122 Type 2 diabetes mellitus with diabetic chronic kidney disease: Secondary | ICD-10-CM

## 2020-01-27 DIAGNOSIS — N184 Chronic kidney disease, stage 4 (severe): Secondary | ICD-10-CM

## 2020-01-27 DIAGNOSIS — I10 Essential (primary) hypertension: Secondary | ICD-10-CM

## 2020-01-27 DIAGNOSIS — Z7689 Persons encountering health services in other specified circumstances: Secondary | ICD-10-CM

## 2020-01-27 DIAGNOSIS — E782 Mixed hyperlipidemia: Secondary | ICD-10-CM | POA: Diagnosis not present

## 2020-01-27 LAB — BAYER DCA HB A1C WAIVED: HB A1C (BAYER DCA - WAIVED): 6.5 % (ref <7.0)

## 2020-01-27 NOTE — Progress Notes (Signed)
Subjective:  Patient ID: Todd [name], male    DOB: Jul 17, 1955  Age: 64 y.o. MRN: [id number]  CC: Medical Management of Chronic Issues   HPI Todd [name] presents forFollow-up of diabetes. Patient checks blood sugar at home occasionally.  Patient denies symptoms such as polyuria, polydipsia, excessive hunger, nausea No significant hypoglycemic spells noted. Medications reviewed. Pt reports taking them regularly without complication/adverse reaction being reported today.   Pt. reports a need for a power mobility device.  As a double AKA patient he is wheelchair-bound.  He does not have prosthetic so he cannot ambulate without a wheelchair.  With a wheelchair he has some difficulty maneuvering because of pain in the arms and shoulders.  As result he cannot adequately move himself from one place to the other in the home ureter to accomplishing toileting grooming cooking cleaning bathing etc. History Todd [name] has a past medical history of AKI (acute kidney injury) ([place]), Constipated, Diabetes mellitus without complication ([place]), Diarrhea, Elevated LFTs, Goiter, Gout, Hyperlipidemia, Hypertension, Leukocytosis, Reactive thrombocytosis, Right BKA infection ([place]) (08/2016), Right leg pain, Sepsis due to undetermined organism [facility]), Thyroid disease, and Wound infection after surgery (08/2016).   He has a past surgical history that includes Thyroid surgery; Lower Extremity Angiography (Bilateral, 08/11/2016); ABDOMINAL AORTOGRAM (N/A, 08/11/2016); PERIPHERAL VASCULAR BALLOON ANGIOPLASTY (Left, 08/11/2016); ABDOMINAL AORTOGRAM W/LOWER EXTREMITY (N/A, 08/15/2016); Amputation (Right, 08/17/2016); Amputation (Right, 09/12/2016); Application if wound vac (Right, 09/12/2016); Amputation (Left, 08/12/2016); and Amputation (Left, 11/01/2017).   His family history includes Diabetes in his maternal aunt and maternal uncle; Heart disease in his mother; Pneumonia in his father.He reports that he has been smoking cigarettes. He  has a 11.25 pack-year smoking history. He has never used smokeless tobacco. He reports previous alcohol use. He reports that he does not use drugs.  Current Outpatient Medications on File Prior to Visit  Medication Sig Dispense Refill   aspirin EC 81 MG tablet Take 81 mg by mouth daily.     atorvastatin (LIPITOR) 40 MG tablet Take 1 tablet (40 mg total) by mouth daily at 6 PM. Needs to be seen for further refills. 30 tablet 0   blood glucose meter kit and supplies Dispense based on patient and insurance preference. Use up to four times daily as directed. (FOR ICD-10 E10.9, E11.9). 1 each 0   cetirizine (ZYRTEC) 10 MG tablet Take 1 tablet (10 mg total) by mouth daily. For allergy symptoms 90 tablet 3   folic acid (FOLVITE) 1 MG tablet Take 1 tablet (1 mg total) by mouth daily.     glucose blood (ACCU-CHEK AVIVA PLUS) test strip Check blood sugars four times a day 400 each 3   insulin glargine (LANTUS SOLOSTAR) 100 UNIT/ML Solostar Pen Inject 40 Units into the skin at bedtime. 30 mL 5   Insulin Pen Needle 31G X 5 MM MISC 1 Device by Does not apply route as directed. 100 each 1   levothyroxine (SYNTHROID) 75 MCG tablet Take 1 tablet (75 mcg total) by mouth daily before breakfast. 30 tablet 3   megestrol (MEGACE) 400 MG/10ML suspension Take 10 mLs (400 mg total) by mouth 2 (two) times daily. For appetite stimulation 600 mL 2   metoprolol succinate (TOPROL-XL) 100 MG 24 hr tablet TAKE 1 TABLET BY MOUTH ONCE DAILY WITH MEALS OR  IMMEDIATELY  FOLLOWING 90 tablet 0   sertraline (ZOLOFT) 50 MG tablet Take 1 tablet (50 mg total) by mouth at bedtime. For anxiety and depression 30 tablet 5   Vitamin D, Ergocalciferol, (  DRISDOL) 1.25 MG (50000 UNIT) CAPS capsule Take 1 capsule by mouth once a week 12 capsule 0   No current facility-administered medications on file prior to visit.    ROS Review of Systems  Constitutional: Negative for fever.  Respiratory: Negative for shortness of breath.    Cardiovascular: Negative for chest pain.  Musculoskeletal: Positive for gait problem (bilateral AKA, wheelchair bound). Negative for arthralgias.  Skin: Negative for rash.  Neurological: Positive for weakness (BUE).    Objective:  BP 113/78    Pulse 70    Temp (!) 97.5 F (36.4 C) (Temporal)    Resp 20    SpO2 99%   BP Readings from Last 3 Encounters:  01/27/20 113/78  12/06/19 131/86  11/25/19 140/78    Wt Readings from Last 3 Encounters:  02/15/18 134 lb 14.7 oz (61.2 kg)  02/05/18 138 lb 14.2 oz (63 kg)  11/30/17 145 lb (65.8 kg)     Physical Exam Vitals reviewed.  Constitutional:      Appearance: He is well-developed.  HENT:     Head: Normocephalic and atraumatic.     Right Ear: Tympanic membrane and external ear normal. No decreased hearing noted.     Left Ear: Tympanic membrane and external ear normal. No decreased hearing noted.     Mouth/Throat:     Pharynx: No oropharyngeal exudate or posterior oropharyngeal erythema.  Eyes:     Pupils: Pupils are equal, round, and reactive to light.  Cardiovascular:     Rate and Rhythm: Normal rate and regular rhythm.     Heart sounds: No murmur heard.   Pulmonary:     Effort: No respiratory distress.     Breath sounds: Normal breath sounds.  Abdominal:     General: Bowel sounds are normal.     Palpations: Abdomen is soft. There is no mass.     Tenderness: There is no abdominal tenderness.  Musculoskeletal:        General: Deformity (bilaterla AKA) present.     Right shoulder: Tenderness present. Decreased strength (3-4/5).     Left shoulder: Tenderness present. Decreased strength (3-4/5).     Cervical back: Normal range of motion and neck supple.    Results for orders placed or performed in visit on 01/27/20  Bayer DCA Hb A1c Waived  Result Value Ref Range   HB A1C (BAYER DCA - WAIVED) 6.5 <7.0 %      Assessment & Plan:   Todd [name] was seen today for medical management of chronic issues.  Diagnoses and all  orders for this visit:  Encounter for power mobility device assessment -     DME Wheelchair electric  Essential hypertension -     CBC with Differential/Platelet -     CMP14+EGFR -     Lipid panel -     Thyroid Panel With TSH  Type 2 diabetes mellitus with stage 4 chronic kidney disease, with long-term current use of insulin (HCC) -     Bayer DCA Hb A1c Waived -     Microalbumin / creatinine urine ratio -     CBC with Differential/Platelet -     CMP14+EGFR -     Lipid panel -     Thyroid Panel With TSH  Mixed hyperlipidemia -     CBC with Differential/Platelet -     CMP14+EGFR -     Lipid panel -     Thyroid Panel With TSH  Hypothyroidism, unspecified type -  CBC with Differential/Platelet -     CMP14+EGFR -     Lipid panel -     Thyroid Panel With TSH  S/P AKA (above knee amputation) bilateral (HCC) -     DME Wheelchair electric      I am having Todd [name] maintain his Insulin Pen Needle, blood glucose meter kit and supplies, folic acid, glucose blood, aspirin EC, sertraline, cetirizine, atorvastatin, Vitamin D (Ergocalciferol), metoprolol succinate, Lantus SoloStar, levothyroxine, and megestrol.  No orders of the defined types were placed in this encounter.    Follow-up: Return in about 3 months (around 04/28/2020).  [name], M.D.

## 2020-01-28 LAB — CMP14+EGFR
ALT: 15 IU/L (ref 0–44)
AST: 36 IU/L (ref 0–40)
Albumin/Globulin Ratio: 0.6 — ABNORMAL LOW (ref 1.2–2.2)
Albumin: 3.1 g/dL — ABNORMAL LOW (ref 3.8–4.8)
Alkaline Phosphatase: 190 IU/L — ABNORMAL HIGH (ref 48–121)
BUN/Creatinine Ratio: 6 — ABNORMAL LOW (ref 10–24)
BUN: 8 mg/dL (ref 8–27)
Bilirubin Total: 0.8 mg/dL (ref 0.0–1.2)
CO2: 18 mmol/L — ABNORMAL LOW (ref 20–29)
Calcium: 8.6 mg/dL (ref 8.6–10.2)
Chloride: 98 mmol/L (ref 96–106)
Creatinine, Ser: 1.27 mg/dL (ref 0.76–1.27)
GFR calc Af Amer: 69 mL/min/{1.73_m2} (ref >59)
GFR calc non Af Amer: 59 mL/min/{1.73_m2} — ABNORMAL LOW (ref >59)
Globulin, Total: 5.2 g/dL — ABNORMAL HIGH (ref 1.5–4.5)
Glucose: 149 mg/dL — ABNORMAL HIGH (ref 65–99)
Potassium: 4.4 mmol/L (ref 3.5–5.2)
Sodium: 130 mmol/L — ABNORMAL LOW (ref 134–144)
Total Protein: 8.3 g/dL (ref 6.0–8.5)

## 2020-01-28 LAB — THYROID PANEL WITH TSH
Free Thyroxine Index: 2.2 (ref 1.2–4.9)
T3 Uptake Ratio: 30 % (ref 24–39)
T4, Total: 7.3 ug/dL (ref 4.5–12.0)
TSH: 0.024 u[IU]/mL — ABNORMAL LOW (ref 0.450–4.500)

## 2020-01-28 LAB — LIPID PANEL
Chol/HDL Ratio: 4.8 ratio (ref 0.0–5.0)
Cholesterol, Total: 149 mg/dL (ref 100–199)
HDL: 31 mg/dL — ABNORMAL LOW (ref >39)
LDL Chol Calc (NIH): 91 mg/dL (ref 0–99)
Triglycerides: 150 mg/dL — ABNORMAL HIGH (ref 0–149)
VLDL Cholesterol Cal: 27 mg/dL (ref 5–40)

## 2020-01-28 LAB — CBC WITH DIFFERENTIAL/PLATELET
Basophils Absolute: 0.1 10*3/uL (ref 0.0–0.2)
Basos: 1 %
EOS (ABSOLUTE): 0.2 10*3/uL (ref 0.0–0.4)
Eos: 4 %
Hematocrit: 39.7 % (ref 37.5–51.0)
Hemoglobin: 13.5 g/dL (ref 13.0–17.7)
Immature Grans (Abs): 0 10*3/uL (ref 0.0–0.1)
Immature Granulocytes: 0 %
Lymphocytes Absolute: 2.3 10*3/uL (ref 0.7–3.1)
Lymphs: 35 %
MCH: 28.3 pg (ref 26.6–33.0)
MCHC: 34 g/dL (ref 31.5–35.7)
MCV: 83 fL (ref 79–97)
Monocytes Absolute: 0.3 10*3/uL (ref 0.1–0.9)
Monocytes: 5 %
Neutrophils Absolute: 3.7 10*3/uL (ref 1.4–7.0)
Neutrophils: 55 %
Platelets: 342 10*3/uL (ref 150–450)
RBC: 4.77 x10E6/uL (ref 4.14–5.80)
RDW: 14.2 % (ref 11.6–15.4)
WBC: 6.6 10*3/uL (ref 3.4–10.8)

## 2020-01-29 NOTE — Progress Notes (Signed)
Hello Todd [name],  Your lab result is normal and/or stable.Some minor variations that are not significant are commonly marked abnormal, but do not represent any medical problem for you.  Best regards, [name], M.D.

## 2020-02-05 DIAGNOSIS — Z89612 Acquired absence of left leg above knee: Secondary | ICD-10-CM | POA: Diagnosis not present

## 2020-02-05 DIAGNOSIS — Z89611 Acquired absence of right leg above knee: Secondary | ICD-10-CM | POA: Diagnosis not present

## 2020-02-14 ENCOUNTER — Ambulatory Visit (INDEPENDENT_AMBULATORY_CARE_PROVIDER_SITE_OTHER): Payer: Medicare HMO | Admitting: Family Medicine

## 2020-02-14 ENCOUNTER — Other Ambulatory Visit: Payer: Self-pay

## 2020-02-14 ENCOUNTER — Ambulatory Visit: Payer: Medicare HMO | Admitting: Family

## 2020-02-14 ENCOUNTER — Encounter: Payer: Self-pay | Admitting: Family Medicine

## 2020-02-14 VITALS — BP 115/68 | HR 68 | Temp 96.6°F

## 2020-02-14 DIAGNOSIS — Z594 Lack of adequate food and safe drinking water: Secondary | ICD-10-CM

## 2020-02-14 DIAGNOSIS — S31829A Unspecified open wound of left buttock, initial encounter: Secondary | ICD-10-CM | POA: Diagnosis not present

## 2020-02-14 DIAGNOSIS — Z5948 Other specified lack of adequate food: Secondary | ICD-10-CM

## 2020-02-14 MED ORDER — TEGADERM HYDROCOLLOID THIN EX MISC: 1.0000 | CUTANEOUS | 0 refills | Status: DC

## 2020-02-14 NOTE — Patient Instructions (Signed)
Apply hydrocolloid to open area of buttocks once weekly. It may fall off before then. If it does, go ahead and apply a new one.  Glucerna is a protein drink if you are not eating well.   Start a multivitamin once daily.   Drink plenty of fluids.  Rotate your position to redistribute your weight and get pressure off the same areas. Lying down will help also - rotate side to side.

## 2020-02-17 NOTE — Progress Notes (Signed)
° °Assessment & Plan:  °1. Wound of left buttock, initial encounter °- Discussed importance of staying clean and dry.  Rx'd thin hydrocolloid dressing for patient to apply once weekly.  Directions read for twice weekly so that if it comes off he has another to use.  Encouraged him to start taking a multivitamin once daily.  Discussed repositioning and lying down to help redistribute pressure and prevent skin breakdown.  Patient needs to eat a healthier diet, which she has a hard time doing since he is unable to go grocery shopping for himself. °- Hydroactive Dressings (TEGADERM HYDROCOLLOID THIN) MISC; Apply 1 each topically 2 (two) times a week.  Dispense: 10 each; Refill: 0 ° °2. Lack of adequate food °- Referral to Chronic Care Management Services ° ° °Follow up plan: Return if symptoms worsen or fail to improve. ° ° , MSN, APRN, FNP-C °[facility] ° °Subjective:  ° °Patient ID: Todd [name], male    DOB: 07/29/1955, 63 y.o.   MRN: [id number] ° °HPI: °Todd [name] is a 63 y.o. male presenting on 02/14/2020 for Recurrent Skin Infections (Patient states that he has two swollen and painful boils on his bottom that has been there x 2 weeks.) ° °Patient is accompanied by his sister who he is okay with being present. ° °Patient complains of 2 swollen and painful boils on his bottom that have been present x2 weeks.  Patient is in a wheelchair as he is a bilateral amputee.  He does have diabetes with his most recent A1c last month being 6.5.  He does have a cushion in his wheelchair.  Patient only eats about 1 meal a day, which today was a can of been doing knees.  He does have a caregiver that comes out 3 times weekly. ° ° °ROS: Negative unless specifically indicated above in HPI.  ° °Relevant past medical history reviewed and updated as indicated.  ° °Allergies and medications reviewed and updated. ° ° °Current Outpatient Medications:  °•  aspirin EC 81 MG tablet, Take 81 mg by  mouth daily., Disp: , Rfl:  °•  atorvastatin (LIPITOR) 40 MG tablet, Take 1 tablet (40 mg total) by mouth daily at 6 PM. Needs to be seen for further refills., Disp: 30 tablet, Rfl: 0 °•  blood glucose meter kit and supplies, Dispense based on patient and insurance preference. Use up to four times daily as directed. (FOR ICD-10 E10.9, E11.9)., Disp: 1 each, Rfl: 0 °•  cetirizine (ZYRTEC) 10 MG tablet, Take 1 tablet (10 mg total) by mouth daily. For allergy symptoms, Disp: 90 tablet, Rfl: 3 °•  folic acid (FOLVITE) 1 MG tablet, Take 1 tablet (1 mg total) by mouth daily., Disp: , Rfl:  °•  glucose blood (ACCU-CHEK AVIVA PLUS) test strip, Check blood sugars four times a day, Disp: 400 each, Rfl: 3 °•  insulin glargine (LANTUS SOLOSTAR) 100 UNIT/ML Solostar Pen, Inject 40 Units into the skin at bedtime., Disp: 30 mL, Rfl: 5 °•  Insulin Pen Needle 31G X 5 MM MISC, 1 Device by Does not apply route as directed., Disp: 100 each, Rfl: 1 °•  levothyroxine (SYNTHROID) 75 MCG tablet, Take 1 tablet (75 mcg total) by mouth daily before breakfast., Disp: 30 tablet, Rfl: 3 °•  megestrol (MEGACE) 400 MG/10ML suspension, Take 10 mLs (400 mg total) by mouth 2 (two) times daily. For appetite stimulation, Disp: 600 mL, Rfl: 2 °•  metoprolol succinate (TOPROL-XL) 100 MG 24 hr tablet, TAKE 1 TABLET   TABLET BY MOUTH ONCE DAILY WITH MEALS OR  IMMEDIATELY  FOLLOWING, Disp: 90 tablet, Rfl: 0   sertraline (ZOLOFT) 50 MG tablet, Take 1 tablet (50 mg total) by mouth at bedtime. For anxiety and depression, Disp: 30 tablet, Rfl: 5   Vitamin D, Ergocalciferol, (DRISDOL) 1.25 MG (50000 UNIT) CAPS capsule, Take 1 capsule by mouth once a week, Disp: 12 capsule, Rfl: 0   Hydroactive Dressings (TEGADERM HYDROCOLLOID THIN) MISC, Apply 1 each topically 2 (two) times a week., Disp: 10 each, Rfl: 0  Allergies  Allergen Reactions   Lisinopril Other (See Comments)    Hyperkalemia / Renal failure    Objective:   BP 115/68    Pulse 68    Temp (!) 96.6  F (35.9 C) (Temporal)    SpO2 100%    Physical Exam Vitals reviewed.  Constitutional:      General: He is not in acute distress.    Appearance: Normal appearance. He is not ill-appearing, toxic-appearing or diaphoretic.  HENT:     Head: Normocephalic and atraumatic.  Eyes:     General: No scleral icterus.       Right eye: No discharge.        Left eye: No discharge.     Conjunctiva/sclera: Conjunctivae normal.  Cardiovascular:     Rate and Rhythm: Normal rate.  Pulmonary:     Effort: Pulmonary effort is normal. No respiratory distress.  Genitourinary:    Comments: Stool around anus.  Musculoskeletal:        General: Normal range of motion.     Cervical back: Normal range of motion.  Skin:    General: Skin is warm and dry.     Comments: Small area (dime sized) of skin breakdown on left buttock.   Neurological:     Mental Status: He is alert and oriented to person, place, and time. Mental status is at baseline.  Psychiatric:        Mood and Affect: Mood normal.        Behavior: Behavior normal.        Thought Content: Thought content normal.        Judgment: Judgment normal.

## 2020-02-18 ENCOUNTER — Other Ambulatory Visit: Payer: Self-pay | Admitting: Family Medicine

## 2020-02-19 ENCOUNTER — Telehealth: Payer: Self-pay | Admitting: Family Medicine

## 2020-02-19 DIAGNOSIS — S31829A Unspecified open wound of left buttock, initial encounter: Secondary | ICD-10-CM

## 2020-02-19 MED ORDER — TEGADERM HYDROCOLLOID THIN EX MISC: 1.0000 | CUTANEOUS | 0 refills | Status: DC

## 2020-02-19 NOTE — Telephone Encounter (Signed)
   SF 02/19/2020   Name: Todd [name]   MRN: [id number]   DOB: 1955/11/10   AGE: 64 y.o.   GENDER: male   PCP [name], MD.   Called patient regarding referral for food insecurities. Patient stated that he is still in need and would like to be added to the [employer] Whe[employer] wait list. Also informed patient that Care Guide will see if there are any other food pantries in the [place] area that would be able to assist him. Patient stated understanding.   Follow up on: 02/20/2020  [place], Care Management Phone: [phone] Email: [email or web address]

## 2020-02-19 NOTE — Telephone Encounter (Signed)
Called [facility] of [facility] for [employer]. Spoke with Manus Gunning who stated that [facility] [facility] has a wait list. Informed Manus Gunning that Care Guide has informed patient about the wait list and that he still wants to be placed on it. Manus Gunning stated to leave a message with the intake coordinator Juliann Pulse and she will give either Care Guide or Mr. Mathisen a call back to be added to the list.

## 2020-02-19 NOTE — Telephone Encounter (Signed)
[place] did not have Hydroactive Dressings (TEGADERM HYDROCOLLOID THIN) MISC please send to [facility]--please call when done.

## 2020-02-19 NOTE — Telephone Encounter (Signed)
[facility] at [phone]. Left message for someone to give Care Guide a call back regarding their program.

## 2020-02-19 NOTE — Telephone Encounter (Signed)
Sent to [facility]

## 2020-02-21 ENCOUNTER — Encounter: Payer: Self-pay | Admitting: Family Medicine

## 2020-02-21 ENCOUNTER — Ambulatory Visit (INDEPENDENT_AMBULATORY_CARE_PROVIDER_SITE_OTHER): Payer: Medicare HMO | Admitting: *Deleted

## 2020-02-21 DIAGNOSIS — I10 Essential (primary) hypertension: Secondary | ICD-10-CM

## 2020-02-21 DIAGNOSIS — Z89611 Acquired absence of right leg above knee: Secondary | ICD-10-CM

## 2020-02-21 DIAGNOSIS — S31829A Unspecified open wound of left buttock, initial encounter: Secondary | ICD-10-CM

## 2020-02-21 DIAGNOSIS — Z794 Long term (current) use of insulin: Secondary | ICD-10-CM

## 2020-02-21 DIAGNOSIS — Z5948 Other specified lack of adequate food: Secondary | ICD-10-CM

## 2020-02-21 DIAGNOSIS — E1122 Type 2 diabetes mellitus with diabetic chronic kidney disease: Secondary | ICD-10-CM

## 2020-02-21 NOTE — Chronic Care Management (AMB) (Signed)
Chronic Care Management   Follow Up Note   02/21/2020 Name: Todd [name] MRN: [id number] DOB: 12-Nov-1955  Referred by: [name], MD Reason for referral : Chronic Care Management (RN follow up)   Todd [name] is a 64 y.o. year old male who is a primary care patient of [name], [name], MD. The CCM team was consulted for assistance with chronic disease management and care coordination needs.    Review of patient status, including review of consultants reports, relevant laboratory and other test results, and collaboration with appropriate care team members and the patient's provider was performed as part of comprehensive patient evaluation and provision of chronic care management services.    SDOH (Social Determinants of Health) assessments performed: Yes See Care Plan activities for detailed interventions related to [facility])     Outpatient Encounter Medications as of 02/21/2020  Medication Sig  . aspirin EC 81 MG tablet Take 81 mg by mouth daily.  [name] atorvastatin (LIPITOR) 40 MG tablet Take 1 tablet (40 mg total) by mouth daily at 6 PM. Needs to be seen for further refills.  . blood glucose meter kit and supplies Dispense based on patient and insurance preference. Use up to four times daily as directed. (FOR ICD-10 E10.9, E11.9).  . cetirizine (ZYRTEC) 10 MG tablet Take 1 tablet (10 mg total) by mouth daily. For allergy symptoms  . folic acid (FOLVITE) 1 MG tablet Take 1 tablet (1 mg total) by mouth daily.  [name] glucose blood (ACCU-CHEK AVIVA PLUS) test strip Check blood sugars four times a day  . Hydroactive Dressings (TEGADERM HYDROCOLLOID THIN) MISC Apply 1 each topically 2 (two) times a week.  . insulin glargine (LANTUS SOLOSTAR) 100 UNIT/ML Solostar Pen Inject 40 Units into the skin at bedtime.  . Insulin Pen Needle 31G X 5 MM MISC 1 Device by Does not apply route as directed.  [name] levothyroxine (SYNTHROID) 75 MCG tablet Take 1 tablet (75 mcg total) by mouth daily before breakfast.   . megestrol (MEGACE) 400 MG/10ML suspension Take 10 mLs (400 mg total) by mouth 2 (two) times daily. For appetite stimulation  . metoprolol succinate (TOPROL-XL) 100 MG 24 hr tablet TAKE 1 TABLET BY MOUTH ONCE DAILY WITH MEALS OR  IMMEDIATELY  FOLLOWING  . sertraline (ZOLOFT) 50 MG tablet Take 1 tablet (50 mg total) by mouth at bedtime. For anxiety and depression  . Vitamin D, Ergocalciferol, (DRISDOL) 1.25 MG (50000 UNIT) CAPS capsule Take 1 capsule by mouth once a week   No facility-administered encounter medications on file as of 02/21/2020.      RN Care Plan: Goals Addressed             This Visit's Progress     Patient Stated   .  "I need help with getting food" (pt-stated)        Poor appetite in patient with diabetes, chronic kidney disease, and bilateral AKA.  Current Barriers:  [name] Knowledge Deficits related to how to improve appetite . Deconditioning, poor health, alcohol use  Nurse Case Manager Clinical Goal(s):  [name] Over the next 90 days, patient will work with PCP regarding medical intervention to increase his appetite  Interventions:  . Chart reviewed  . Talked with patient by telephone . Chart reviewed including recent office and telephone notes . Confirmed that patient is working with [place] regarding food assistance . Reviewed medications  . Instructed patient to reach out to PCP or RN CCM with any questions or concerns . CCM team  will continue to follow  Patient Self Care Activities:  . Needs assistance with food acquisition  . Can prepare food and feed himself  Please see past updates related to this goal by clicking on the "Past Updates" button in the selected goal      .  "I want the sores on my bottom to heal" (pt-stated)        CARE PLAN ENTRY (see longitudinal plan of care for additional care plan information)  Current Barriers:  . Care Coordination needs related to pressure ulcers in a patient with DM, HTN, and bilateral  AKA . Transportation barriers  Nurse Case Manager Clinical Goal(s):  [name] Over the next 30 days, patient will continue to keep the area clean, dry, and follow wound care instructions given by provider . Over the next 30 days, patient will see improvement in wound healing . Over the next 30 days, patient will reach out to PCP with any new or worsening symptoms  Interventions:  . Inter-disciplinary care team collaboration (see longitudinal plan of care) . Chart reviewed including recent office notes, orders, telephone notes, and lab results . Discussed wound care with patient . Confirmed that he was able to pickup Tegaderm Hydrocolloid from [facility] and that he has been using it . Confirmed that caregiver is coming by 3 times a week . Discussed nutrition and care guides help with food assistance . Encouraged patient to change positions at least every two hours to relieve pressure . Encouraged continued use of wheelchair cushion . Encouraged to reach out to PCP with any new or worsening symptoms . Encouraged patient to reach out to [facility] as needed  Patient Self Care Activities:  . Calls pharmacy for medication refills . Calls provider office for new concerns or questions . Unable to independently walk or drive but uses a wheelchair  Initial goal documentation       Other   .  "I don't want to fall"   On track     Current Barriers:  [name] Knowledge Deficits related to fall precautions . Decreased adherence to prescribed treatment for fall prevention  Nurse Case Manager Clinical Goal(s):  [name] Over the next 30 days, patient will demonstrate improved adherence to prescribed treatment plan for decreasing falls as evidenced by patient reporting and review of EMR . Over the next 30days, patient will verbalize using fall risk reduction strategies discussed . Over the next 90 days, patient will not experience additional falls  Interventions:  . Provided verbal education re: Potential causes of falls  and Fall prevention strategies . Assessed for falls since last encounter. . Lives in a handicap accessible apartment . Discussed that he has an aid that comes 3 days a week . Assessed patients knowledge of fall risk prevention secondary to previously provided education. . Confirmed that patient has life alert system  Patient Self Care Activities:  . Utilize wheelchair (assistive device) appropriately with all ambulation . De-clutter walkways . Change positions slowly . Wear secure fitting shoes at all times with ambulation . Utilize home lighting for dim lit areas . Have self and pet awareness at all times  Please see past updates related to this goal by clicking on the "Past Updates" button in the selected goal          Plan:   The care management team will reach out to the patient again over the next 21 days.   Chong Sicilian, BSN, RN-BC Embedded Chronic Care Manager [place] [facility]  Care Management Direct Dial: [phone]

## 2020-02-21 NOTE — Telephone Encounter (Signed)
Called [employer] regarding resources. Spoke with representative, she stated that their organization is currently at capacity for food delivery, but if the patient can has a family member come to the facility to pick up food supplies for him. Care Guide will call patient to let him know.

## 2020-02-21 NOTE — Telephone Encounter (Signed)
° °  SF 02/21/2020    Name: [name]    MRN: [id number]    DOB: 01/26/1956    AGE: 64 y.o.    GENDER: male    PCP [name], MD.   Spoke with patient today regarding referral. Patient stated that no one has called him from [employer] for Meals [employer] Wheels. Informed patient that Care Guide left message for intake worker Juliann Pulse to give him a call. Also gave patient the information for [employer]. [name] stated that he might be able to find someone that can go to [address] and get food supplies for him. Informed patient that their facility will be open tomorrow, Saturday, February 22, 2020. Patient stated understanding. Asked patient if he would like to receive resources for other food banks, patient stated he would like to receive that information in the mail. Asked patient if he has applied for [employer] patient stated that he has not. Tried to connect patient to [facility], but patient did not want to apply at this time.Care Guide also called [place] at [phone]. Spoke with representative that stated patient can apply over the phone or he can apply online at [email or web address]. Will send patient the contact information for [facility] so he can give them a call when he is ready to apply. No additional needs at this time.   Closing referral pending any other needs of patient.     [place], Care Management Phone: [phone] Email: [email or web address]

## 2020-02-21 NOTE — Patient Instructions (Signed)
Visit Information  Goals Addressed              This Visit's Progress     Patient Stated   .  "I need help with getting food" (pt-stated)        Poor appetite in patient with diabetes, chronic kidney disease, and bilateral AKA.  Current Barriers:  [name] Knowledge Deficits related to how to improve appetite . Deconditioning, poor health, alcohol use  Nurse Case Manager Clinical Goal(s):  [name] Over the next 90 days, patient will work with PCP regarding medical intervention to increase his appetite  Interventions:  . Chart reviewed  . Talked with patient by telephone . Chart reviewed including recent office and telephone notes . Confirmed that patient is working with [place] regarding food assistance . Reviewed medications  . Instructed patient to reach out to PCP or RN CCM with any questions or concerns . CCM team will continue to follow  Patient Self Care Activities:  . Needs assistance with food acquisition  . Can prepare food and feed himself  Please see past updates related to this goal by clicking on the "Past Updates" button in the selected goal      .  "I want the sores on my bottom to heal" (pt-stated)        CARE PLAN ENTRY (see longitudinal plan of care for additional care plan information)  Current Barriers:  . Care Coordination needs related to pressure ulcers in a patient with DM, HTN, and bilateral AKA . Transportation barriers  Nurse Case Manager Clinical Goal(s):  [name] Over the next 30 days, patient will continue to keep the area clean, dry, and follow wound care instructions given by provider . Over the next 30 days, patient will see improvement in wound healing . Over the next 30 days, patient will reach out to PCP with any new or worsening symptoms  Interventions:  . Inter-disciplinary care team collaboration (see longitudinal plan of care) . Chart reviewed including recent office notes, orders, telephone notes, and lab results . Discussed wound care  with patient . Confirmed that he was able to pickup Tegaderm Hydrocolloid from [facility] and that he has been using it . Confirmed that caregiver is coming by 3 times a week . Discussed nutrition and care guides help with food assistance . Encouraged patient to change positions at least every two hours to relieve pressure . Encouraged continued use of wheelchair cushion . Encouraged to reach out to PCP with any new or worsening symptoms . Encouraged patient to reach out to [facility] as needed  Patient Self Care Activities:  . Calls pharmacy for medication refills . Calls provider office for new concerns or questions . Unable to independently walk or drive but uses a wheelchair  Initial goal documentation       Other   .  "I don't want to fall"   On track     Current Barriers:  [name] Knowledge Deficits related to fall precautions . Decreased adherence to prescribed treatment for fall prevention  Nurse Case Manager Clinical Goal(s):  [name] Over the next 30 days, patient will demonstrate improved adherence to prescribed treatment plan for decreasing falls as evidenced by patient reporting and review of EMR . Over the next 30days, patient will verbalize using fall risk reduction strategies discussed . Over the next 90 days, patient will not experience additional falls  Interventions:  . Provided verbal education re: Potential causes of falls and Fall prevention strategies . Assessed for  falls since last encounter. . Lives in a handicap accessible apartment . Discussed that he has an aid that comes 3 days a week . Assessed patients knowledge of fall risk prevention secondary to previously provided education. . Confirmed that patient has life alert system  Patient Self Care Activities:  . Utilize wheelchair (assistive device) appropriately with all ambulation . De-clutter walkways . Change positions slowly . Wear secure fitting shoes at all times with ambulation . Utilize home lighting for dim lit  areas . Have self and pet awareness at all times  Please see past updates related to this goal by clicking on the "Past Updates" button in the selected goal      .  COMPLETED: [place] (see longtitudinal plan of care for additional care plan information) Home health and DME supply needs in patient with HTN, DM, and bilateral AKA  Current Barriers:  [name] Knowledge Deficits related to insurance coverage of supplies . Film/video editor.   Nurse Case Manager Clinical Goal(s):  [name] Over the next 30 days, patient will verbalize understanding of plan for obtaining DME supplies . Over the next 30 day, patient will work with [facility] agency OT department   Interventions:  . Communicated with sister, [name] regarding OT, shower bench seat, and toilet rails . Recommended that they contact DME supplier or insurance company regarding coverage of this equipment and patient cost. Unfortunately [facility] does not have that information.  . Collaborated with Jamelle Haring, LPN at [facility] regarding DME supply orders and continuation of home health services since PT has ended. The expectation is that they will extend these services and add OT. [name] Previously provided with RN Contact information and encouraged to reach out as needed  Patient Self Care Activities:  . Performs ADL's independently . Unable to perform IADLs independently due to AKA  Initial goal documentation        Patient verbalizes understanding of instructions provided today.   Follow-up Plan The care management team will reach out to the patient again over the next 21 days.   Chong Sicilian, BSN, RN-BC Embedded Chronic Care Manager [facility] / [place] Management Direct Dial: [phone]

## 2020-02-21 NOTE — Telephone Encounter (Signed)
Sent e-mail to [employer]) at [employer] to print and mail resource letter to patient.

## 2020-02-24 ENCOUNTER — Ambulatory Visit: Payer: Medicare HMO | Admitting: Nurse Practitioner

## 2020-02-26 ENCOUNTER — Ambulatory Visit: Payer: Medicare HMO | Admitting: Nurse Practitioner

## 2020-02-27 DIAGNOSIS — Z89612 Acquired absence of left leg above knee: Secondary | ICD-10-CM | POA: Diagnosis not present

## 2020-02-27 DIAGNOSIS — Z89611 Acquired absence of right leg above knee: Secondary | ICD-10-CM | POA: Diagnosis not present

## 2020-02-28 ENCOUNTER — Ambulatory Visit: Payer: Medicare HMO | Admitting: Licensed Clinical Social Worker

## 2020-02-28 DIAGNOSIS — E782 Mixed hyperlipidemia: Secondary | ICD-10-CM

## 2020-02-28 DIAGNOSIS — Z794 Long term (current) use of insulin: Secondary | ICD-10-CM

## 2020-02-28 DIAGNOSIS — E1122 Type 2 diabetes mellitus with diabetic chronic kidney disease: Secondary | ICD-10-CM

## 2020-02-28 DIAGNOSIS — I10 Essential (primary) hypertension: Secondary | ICD-10-CM

## 2020-02-28 NOTE — Chronic Care Management (AMB) (Signed)
Chronic Care Management    Clinical Social Work Follow Up Note  02/28/2020 Name: Todd [name] MRN: [id number] DOB: 1955-09-19  Todd [name] is a 64 y.o. year old male who is a primary care patient of [name], [name], MD. The CCM team was consulted for assistance with Todd [name] .   Review of patient status, including review of consultants reports, other relevant assessments, and collaboration with appropriate care team members and the patient's provider was performed as part of comprehensive patient evaluation and provision of chronic care management services.    SDOH (Social Determinants of Health) assessments performed: No;risk for social isolation; risk for tobacco use; risk for depression; risk for stress; risk for physical inactivity    Chronic Care Management from 07/03/2019 in [place]  PHQ-9 Total Score 6     GAD 7 : Generalized Anxiety Score 07/03/2019  Nervous, Anxious, on Edge 1  Control/stop worrying 0  Worry too much - different things 1  Trouble relaxing 0  Restless 1  Easily annoyed or irritable 1  Afraid - awful might happen 1  Total GAD 7 Score 5  Anxiety Difficulty Somewhat difficult    Outpatient Encounter Medications as of 02/28/2020  Medication Sig  . aspirin EC 81 MG tablet Take 81 mg by mouth daily.  [name] atorvastatin (LIPITOR) 40 MG tablet Take 1 tablet (40 mg total) by mouth daily at 6 PM. Needs to be seen for further refills.  . blood glucose meter kit and supplies Dispense based on patient and insurance preference. Use up to four times daily as directed. (FOR ICD-10 E10.9, E11.9).  . cetirizine (ZYRTEC) 10 MG tablet Take 1 tablet (10 mg total) by mouth daily. For allergy symptoms  . folic acid (FOLVITE) 1 MG tablet Take 1 tablet (1 mg total) by mouth daily.  [name] glucose blood (ACCU-CHEK AVIVA PLUS) test strip Check blood sugars four times a day  . Hydroactive Dressings (TEGADERM HYDROCOLLOID THIN) MISC Apply 1 each  topically 2 (two) times a week.  . insulin glargine (LANTUS SOLOSTAR) 100 UNIT/ML Solostar Pen Inject 40 Units into the skin at bedtime.  . Insulin Pen Needle 31G X 5 MM MISC 1 Device by Does not apply route as directed.  [name] levothyroxine (SYNTHROID) 75 MCG tablet Take 1 tablet (75 mcg total) by mouth daily before breakfast.  . megestrol (MEGACE) 400 MG/10ML suspension Take 10 mLs (400 mg total) by mouth 2 (two) times daily. For appetite stimulation  . metoprolol succinate (TOPROL-XL) 100 MG 24 hr tablet TAKE 1 TABLET BY MOUTH ONCE DAILY WITH MEALS OR  IMMEDIATELY  FOLLOWING  . sertraline (ZOLOFT) 50 MG tablet Take 1 tablet (50 mg total) by mouth at bedtime. For anxiety and depression  . Vitamin D, Ergocalciferol, (DRISDOL) 1.25 MG (50000 UNIT) CAPS capsule Take 1 capsule by mouth once a week   No facility-administered encounter medications on file as of 02/28/2020.    LCSW called client phone number several times today but LCSW was not able to speak via phone with client. Phone message was not set up and thus LCSW could not leave phone message for client. LCSW also called number for Todd [name], sister of client, several times today but was unable to speak today with Todd [name]. LCSW did leave phone message for Todd [name] requesting that she please return call to LCSW at [phone].  Follow Up Plan: LCSW to call client or sister, Todd [name] in next 4 weeks to talk about client grief issues faced  Todd [name].Todd [name] MSW, LCSW Licensed Clinical Social Worker [facility] Care Management [phone]

## 2020-02-28 NOTE — Patient Instructions (Addendum)
Licensed Clinical Social Worker Visit Information  Materials Provided: No  02/28/2020  Name: Nyheim Seufert         MRN: [id number]       DOB: Feb 02, 1956  Damaree Sargent is a 64 y.o. year old male who is a primary care patient of [name], [name], MD. The CCM team was consulted for assistance with [name] .   Review of patient status, including review of consultants reports, other relevant assessments, and collaboration with appropriate care team members and the patient's provider was performed as part of comprehensive patient evaluation and provision of chronic care management services.    SDOH (Social Determinants of Health) assessments performed: No;risk for social isolation; risk for tobacco use; risk for depression; risk for stress; risk for physical inactivity  LCSW called client phone number several times today but LCSW was not able to speak via phone with client. Phone message was not set up and thus LCSW could not leave phone message for client. LCSW also called number for [name], sister of client, several times today but was unable to speak today with [name]. LCSW did leave phone message for [name] requesting that she please return call to LCSW at [phone].  Follow Up Plan: LCSW to call client or sister, [name] in next 4 weeks to talk about client grief issues faced  LCSW was not able to speak with client or his sister via phone today; thus, the patient or his sister were not able to verbalize understanding of instructions provided today and were not able to accept or decline a print copy of patient instruction materials.   [name].Makahla Kiser MSW, LCSW Licensed Clinical Social Worker [facility] Care Management [phone]

## 2020-03-05 ENCOUNTER — Telehealth (INDEPENDENT_AMBULATORY_CARE_PROVIDER_SITE_OTHER): Payer: Medicare HMO | Admitting: Nurse Practitioner

## 2020-03-05 ENCOUNTER — Encounter: Payer: Self-pay | Admitting: Nurse Practitioner

## 2020-03-05 DIAGNOSIS — E1122 Type 2 diabetes mellitus with diabetic chronic kidney disease: Secondary | ICD-10-CM | POA: Diagnosis not present

## 2020-03-05 DIAGNOSIS — Z794 Long term (current) use of insulin: Secondary | ICD-10-CM

## 2020-03-05 DIAGNOSIS — N184 Chronic kidney disease, stage 4 (severe): Secondary | ICD-10-CM | POA: Diagnosis not present

## 2020-03-05 DIAGNOSIS — E039 Hypothyroidism, unspecified: Secondary | ICD-10-CM | POA: Diagnosis not present

## 2020-03-05 MED ORDER — LEVOTHYROXINE SODIUM 50 MCG PO TABS: 50.0000 ug | ORAL_TABLET | Freq: Every day | ORAL | 3 refills | Status: DC

## 2020-03-05 NOTE — Patient Instructions (Signed)

## 2020-03-05 NOTE — Progress Notes (Addendum)
03/05/2020, 8:31 AM        Endocrinology follow-up note     TELEHEALTH VISIT: The patient is being engaged in telehealth visit due to COVID-19.  This type of visit limits physical examination significantly, and thus is not preferable over face-to-face encounters.  I connected with  Todd [name] on 03/05/20 by a video enabled telemedicine application and verified that I am speaking with the correct person using two identifiers.   I discussed the limitations of evaluation and management by telemedicine. The patient expressed understanding and agreed to proceed.    The participants involved in this visit include: Brita Romp, NP located at [facility] and Foch Rosenwald  located at their personal residence listed.    Subjective:    Patient ID: Todd [name], male    DOB: [birth year].  Todd [name] is here to follow-up for management of currently uncontrolled symptomatic diabetes requested by  [name], MD.   Past Medical History:  Diagnosis Date  . AKI (acute kidney injury) ([place])   . Constipated   . Diabetes mellitus without complication ([place])   . Diarrhea   . Elevated LFTs   . Goiter   . Gout   . Hyperlipidemia   . Hypertension   . Leukocytosis   . Reactive thrombocytosis   . Right BKA infection ([place]) 08/2016  . Right leg pain   . Sepsis due to undetermined organism ([place])   . Thyroid disease   . Wound infection after surgery 08/2016    Past Surgical History:  Procedure Laterality Date  . ABDOMINAL AORTOGRAM N/A 08/11/2016   Procedure: Abdominal Aortogram;  Surgeon: Waynetta Sandy, MD;  Location: [place] [facility];  Service: Cardiovascular;  Laterality: N/A;  . ABDOMINAL AORTOGRAM W/LOWER EXTREMITY N/A 08/15/2016   Procedure: Abdominal Aortogram w/Lower Extremity;  Surgeon: Elam Dutch, MD;  Location: [place] [facility];  Service: Cardiovascular;   Laterality: N/A;  . AMPUTATION Right 08/17/2016   Procedure: RIGHT BELOW KNEE AMPUTATION;  Surgeon: Elam Dutch, MD;  Location: [place];  Service: Vascular;  Laterality: Right;  . AMPUTATION Right 09/12/2016   Procedure: AMPUTATION ABOVE KNEE;  Surgeon: Newt Minion, MD;  Location: [place];  Service: Orthopedics;  Laterality: Right;  . AMPUTATION Left 08/12/2016   Procedure: LEFT BELOW KNEE AMPUTATION;  Surgeon: Newt Minion, MD;  Location: [place];  Service: Orthopedics;  Laterality: Left;  . AMPUTATION Left 11/01/2017   Procedure: LEFT ABOVE KNEE AMPUTATION;  Surgeon: Newt Minion, MD;  Location: [place];  Service: Orthopedics;  Laterality: Left;  . APPLICATION OF WOUND VAC Right 09/12/2016   Procedure: APPLICATION OF WOUND VAC ABOVE KNEE;  Surgeon: Newt Minion, MD;  Location: [place];  Service: Orthopedics;  Laterality: Right;  . LOWER EXTREMITY ANGIOGRAPHY Bilateral 08/11/2016   Procedure: Lower Extremity Angiography;  Surgeon: Waynetta Sandy, MD;  Location: [place] [facility];  Service: Cardiovascular;  Laterality: Bilateral;  . PERIPHERAL VASCULAR BALLOON ANGIOPLASTY Left 08/11/2016   Procedure: Peripheral Vascular Balloon Angioplasty;  Surgeon: Waynetta Sandy, MD;  Location: [place] [facility];  Service: Cardiovascular;  Laterality: Left;  SFA  . THYROID SURGERY      Social History   Socioeconomic History  .  Marital status: Single    Spouse name: Not on file  . Number of children: Not on file  . Years of education: Not on file  . Highest education level: Not on file  Occupational History  . Occupation: drives a Forensic scientist  Tobacco Use  . Smoking status: Current Every Day Smoker    Packs/day: 0.25    Years: 45.00    Pack years: 11.25    Types: Cigarettes    Last attempt to quit: 11/12/2014    Years since quitting: 5.3  . Smokeless tobacco: Never Used  Vaping Use  . Vaping Use: Never used  Substance and Sexual Activity  . Alcohol use: Not Currently    Alcohol/week:  0.0 standard drinks  . Drug use: No  . Sexual activity: Not Currently  Other Topics Concern  . Not on file  Social History Narrative  . Not on file   Social Determinants of Health   Financial Resource Strain: Low Risk   . Difficulty of Paying Living Expenses: Not very hard  Food Insecurity: No Food Insecurity  . Worried About Charity fundraiser in the Last Year: Never true  . Ran Out of Food in the Last Year: Never true  Transportation Needs: No Transportation Needs  . Lack of Transportation (Medical): No  . Lack of Transportation (Non-Medical): No  Physical Activity: Inactive  . Days of Exercise per Week: 0 days  . Minutes of Exercise per Session: 0 min  Stress: Stress Concern Present  . Feeling of Stress : To some extent  Social Connections: Socially Isolated  . Frequency of Communication with Friends and Family: Three times a week  . Frequency of Social Gatherings with Friends and Family: Three times a week  . Attends Religious Services: Never  . Active Member of Clubs or Organizations: No  . Attends Archivist Meetings: Never  . Marital Status: Never married    Family History  Problem Relation Age of Onset  . Heart disease Mother   . Pneumonia Father   . Diabetes Maternal Aunt   . Diabetes Maternal Uncle     Outpatient Encounter Medications as of 03/05/2020  Medication Sig  . aspirin EC 81 MG tablet Take 81 mg by mouth daily.  [name] atorvastatin (LIPITOR) 40 MG tablet Take 1 tablet (40 mg total) by mouth daily at 6 PM. Needs to be seen for further refills.  . blood glucose meter kit and supplies Dispense based on patient and insurance preference. Use up to four times daily as directed. (FOR ICD-10 E10.9, E11.9).  . cetirizine (ZYRTEC) 10 MG tablet Take 1 tablet (10 mg total) by mouth daily. For allergy symptoms  . folic acid (FOLVITE) 1 MG tablet Take 1 tablet (1 mg total) by mouth daily.  [name] glucose blood (ACCU-CHEK AVIVA PLUS) test strip Check blood sugars  four times a day  . Hydroactive Dressings (TEGADERM HYDROCOLLOID THIN) MISC Apply 1 each topically 2 (two) times a week.  . insulin glargine (LANTUS SOLOSTAR) 100 UNIT/ML Solostar Pen Inject 40 Units into the skin at bedtime.  . Insulin Pen Needle 31G X 5 MM MISC 1 Device by Does not apply route as directed.  [name] levothyroxine (SYNTHROID) 50 MCG tablet Take 1 tablet (50 mcg total) by mouth daily before breakfast.  . megestrol (MEGACE) 400 MG/10ML suspension Take 10 mLs (400 mg total) by mouth 2 (two) times daily. For appetite stimulation  . metoprolol succinate (TOPROL-XL) 100 MG 24 hr tablet TAKE 1 TABLET BY  MOUTH ONCE DAILY WITH MEALS OR  IMMEDIATELY  FOLLOWING  . sertraline (ZOLOFT) 50 MG tablet Take 1 tablet (50 mg total) by mouth at bedtime. For anxiety and depression  . Vitamin D, Ergocalciferol, (DRISDOL) 1.25 MG (50000 UNIT) CAPS capsule Take 1 capsule by mouth once a week  . [DISCONTINUED] levothyroxine (SYNTHROID) 75 MCG tablet Take 1 tablet (75 mcg total) by mouth daily before breakfast.   No facility-administered encounter medications on file as of 03/05/2020.    ALLERGIES: Allergies  Allergen Reactions  . Lisinopril Other (See Comments)    Hyperkalemia / Renal failure    VACCINATION STATUS: Immunization History  Administered Date(s) Administered  . Influenza,inj,Quad PF,6+ Mos 05/20/2015, 04/07/2017, 03/09/2018, 05/01/2019  . Influenza-Unspecified 05/10/2011  . PPD Test 09/16/2016    Diabetes He presents for his follow-up diabetic visit. He has type 2 diabetes mellitus. Onset time: Patient was diagnosed with A1c of greater than 14% in 2017 at the age of 37 years. His disease course has been improving. Pertinent negatives for hypoglycemia include no confusion, headaches, nervousness/anxiousness, pallor, seizures or tremors. Pertinent negatives for diabetes include no chest pain, no fatigue, no polydipsia, no polyphagia, no polyuria and no weakness. There are no hypoglycemic  complications. Symptoms are improving. Diabetic complications include nephropathy and PVD. Risk factors for coronary artery disease include dyslipidemia, diabetes mellitus, male sex, sedentary lifestyle, tobacco exposure and hypertension. Current diabetic treatment includes insulin injections. He is compliant with treatment most of the time. His weight is decreasing steadily. He is following a generally unhealthy diet. When asked about meal planning, he reported none. He has not had a previous visit with a dietitian. He never participates in exercise. His home blood glucose trend is decreasing steadily. His breakfast blood glucose range is generally 110-130 mg/dl. His overall blood glucose range is 110-130 mg/dl. (He presents for his virtual visit with his logs showing at target fasting and postprandial glycemic profile.  His previsit A1C was 6.5% on 01/27/20, improving from 6.8% at last visit.  He denies any episodes of hypoglycemia.) An ACE inhibitor/angiotensin II receptor blocker is not being taken. He does not see a podiatrist.Eye exam is current.  Hyperlipidemia This is a chronic problem. The current episode started more than 1 year ago. The problem is controlled. Recent lipid tests were reviewed and are normal. Exacerbating diseases include chronic renal disease, diabetes and hypothyroidism. Factors aggravating his hyperlipidemia include beta blockers. Pertinent negatives include no chest pain, myalgias or shortness of breath. Current antihyperlipidemic treatment includes statins. The current treatment provides mild improvement of lipids. Compliance problems include adherence to diet and adherence to exercise.  Risk factors for coronary artery disease include diabetes mellitus, dyslipidemia, a sedentary lifestyle, male sex and hypertension.  Thyroid Problem Presents for follow-up visit. Onset time: He reports thyroidectomy in 2013 due to benign nodular goiter. Patient reports no anxiety, cold intolerance,  constipation, depressed mood, diarrhea, fatigue, heat intolerance, palpitations or tremors. The symptoms have been stable. The treatment provided moderate relief. Prior procedures include thyroidectomy. His past medical history is significant for diabetes and hyperlipidemia.   Review of systems  Constitutional: + Minimally fluctuating body weight,  current There is no height or weight on file to calculate BMI. , no fatigue, no subjective hyperthermia, no subjective hypothermia Eyes: no blurry vision, no xerophthalmia ENT: no sore throat, no nodules palpated in throat, no dysphagia/odynophagia, no hoarseness Cardiovascular: no chest pain, no shortness of breath, no palpitations, no leg swelling Respiratory: no cough, no shortness of breath Gastrointestinal:  no nausea/vomiting/diarrhea Musculoskeletal: no muscle/joint aches Skin: no rashes, no hyperemia Neurological: no tremors, no numbness, no tingling, no dizziness Psychiatric: no depression, no anxiety  Objective:    There were no vitals taken for this visit.  Wt Readings from Last 3 Encounters:  02/15/18 134 lb 14.7 oz (61.2 kg)  02/05/18 138 lb 14.2 oz (63 kg)  11/30/17 145 lb (65.8 kg)    BP Readings from Last 3 Encounters:  02/14/20 115/68  01/27/20 113/78  12/06/19 131/86    Physical Exam- Telehealth- significantly limited due to nature of visit  Constitutional: There is no height or weight on file to calculate BMI. , not in acute distress, normal state of mind Respiratory: Adequate breathing efforts  Diabetic Labs (most recent): Lab Results  Component Value Date   HGBA1C 6.5 01/27/2020   HGBA1C 6.8 09/30/2019   HGBA1C 7.1 (A) 06/17/2019     Lipid Panel ( most recent) Lipid Panel     Component Value Date/Time   CHOL 149 01/27/2020 0859   TRIG 150 (H) 01/27/2020 0859   HDL 31 (L) 01/27/2020 0859   CHOLHDL 4.8 01/27/2020 0859   LDLCALC 91 01/27/2020 0859     Recent Results (from the past 2160 hour(s))  Bayer  DCA Hb A1c Waived     Status: None   Collection Time: 01/27/20  7:57 AM  Result Value Ref Range   HB A1C (BAYER DCA - WAIVED) 6.5 <7.0 %    Comment:                                       Diabetic Adult            <7.0                                       Healthy Adult        4.3 - 5.7                                                           (DCCT/NGSP) American Diabetes Association's Summary of Glycemic Recommendations for Adults with Diabetes: Hemoglobin A1c <7.0%. More stringent glycemic goals (A1c <6.0%) may further reduce complications at the cost of increased risk of hypoglycemia.   CBC with Differential/Platelet     Status: None   Collection Time: 01/27/20  8:59 AM  Result Value Ref Range   WBC 6.6 3.4 - 10.8 x10E3/uL   RBC 4.77 4.14 - 5.80 x10E6/uL   Hemoglobin 13.5 13.0 - 17.7 g/dL   Hematocrit 39.7 37.5 - 51.0 %   MCV 83 79 - 97 fL   MCH 28.3 26.6 - 33.0 pg   MCHC 34.0 31 - 35 g/dL   RDW 14.2 11.6 - 15.4 %   Platelets 342 150 - 450 x10E3/uL   Neutrophils 55 Not Estab. %   Lymphs 35 Not Estab. %   Monocytes 5 Not Estab. %   Eos 4 Not Estab. %   Basos 1 Not Estab. %   Neutrophils Absolute 3.7 1 - 7 x10E3/uL   Lymphocytes Absolute 2.3 0 - 3 x10E3/uL   Monocytes Absolute 0.3  0 - 0 x10E3/uL   EOS (ABSOLUTE) 0.2 0.0 - 0.4 x10E3/uL   Basophils Absolute 0.1 0 - 0 x10E3/uL   Immature Granulocytes 0 Not Estab. %   Immature Grans (Abs) 0.0 0.0 - 0.1 x10E3/uL  CMP14+EGFR     Status: Abnormal   Collection Time: 01/27/20  8:59 AM  Result Value Ref Range   Glucose 149 (H) 65 - 99 mg/dL   BUN 8 8 - 27 mg/dL   Creatinine, Ser 1.27 0.76 - 1.27 mg/dL   GFR calc non Af Amer 59 (L) >59 mL/min/1.73   GFR calc Af Amer 69 >59 mL/min/1.73    Comment: **[facility] currently reports eGFR in compliance with the current**   recommendations of the [employer]. [facility] will   update reporting as new guidelines are published from the NKF-ASN   Task force.    BUN/Creatinine  Ratio 6 (L) 10 - 24   Sodium 130 (L) 134 - 144 mmol/L   Potassium 4.4 3.5 - 5.2 mmol/L   Chloride 98 96 - 106 mmol/L   CO2 18 (L) 20 - 29 mmol/L   Calcium 8.6 8.6 - 10.2 mg/dL   Total Protein 8.3 6.0 - 8.5 g/dL   Albumin 3.1 (L) 3.8 - 4.8 g/dL   Globulin, Total 5.2 (H) 1.5 - 4.5 g/dL   Albumin/Globulin Ratio 0.6 (L) 1.2 - 2.2   Bilirubin Total 0.8 0.0 - 1.2 mg/dL   Alkaline Phosphatase 190 (H) 48 - 121 IU/L   AST 36 0 - 40 IU/L   ALT 15 0 - 44 IU/L  Lipid panel     Status: Abnormal   Collection Time: 01/27/20  8:59 AM  Result Value Ref Range   Cholesterol, Total 149 100 - 199 mg/dL   Triglycerides 150 (H) 0 - 149 mg/dL   HDL 31 (L) >39 mg/dL   VLDL Cholesterol Cal 27 5 - 40 mg/dL   LDL Chol Calc (NIH) 91 0 - 99 mg/dL   Chol/HDL Ratio 4.8 0.0 - 5.0 ratio    Comment:                                   T. Chol/HDL Ratio                                             Men  Women                               1/2 Avg.Risk  3.4    3.3                                   Avg.Risk  5.0    4.4                                2X Avg.Risk  9.6    7.1                                3X Avg.Risk 23.4   11.0   Thyroid Panel With TSH  Status: Abnormal   Collection Time: 01/27/20  8:59 AM  Result Value Ref Range   TSH 0.024 (L) 0.450 - 4.500 uIU/mL   T4, Total 7.3 4.5 - 12.0 ug/dL   T3 Uptake Ratio 30 24 - 39 %   Free Thyroxine Index 2.2 1.2 - 4.9       Assessment & Plan:   1. Type 2 diabetes mellitus with stage 4 chronic kidney disease, with long-term current use of insulin (HCC)  - Asim Gersten has currently uncontrolled symptomatic type 2 DM since 64 years of age.  He presents for his virtual visit with his logs showing at target fasting and postprandial glycemic profile.  His previsit A1C was 6.5% on 01/27/20, improving from 6.8% at last visit.  He denies any episodes of hypoglycemia.  -This patient has history of heavy alcohol use/abuse which might have contributed to the  pathogenesis of his diabetes. -I had a long discussion with him about the progressive nature of type 2 diabetes and the pathology behind its complications.   -his diabetes is complicated by peripheral arterial disease status post bilateral above-knee amputation -sedentary life, renal insufficiency, and he remains at extremely high risk for more acute and chronic complications which include CAD, CVA, CKD, retinopathy, and neuropathy. These are all discussed in detail with him.  - I have counseled him on diet management by adopting a carbohydrate restricted/protein rich diet.  - The patient admits there is a room for improvement in their diet and drink choices. -  Suggestion is made for the patient to avoid simple carbohydrates from their diet including Cakes, Sweet Desserts / Pastries, Ice Cream, Soda (diet and regular), Sweet Tea, Candies, Chips, Cookies, Sweet Pastries,  Store Bought Juices, Alcohol in Excess of  1-2 drinks a day, Artificial Sweeteners, Coffee Creamer, and "Sugar-free" Products. This will help patient to have stable blood glucose profile and potentially avoid unintended weight gain.   - I encouraged the patient to switch to  unprocessed or minimally processed complex starch and increased protein intake (animal or plant source), fruits, and vegetables.   - Patient is advised to stick to a routine mealtimes to eat 3 meals  a day and avoid unnecessary snacks ( to snack only to correct hypoglycemia).  - I have approached him with the following individualized plan to manage diabetes and patient agrees:   -#1 priority in his care will be to avoid hypoglycemia.  -He is tolerating his current dose of Lantus well.  He is advised to continue Lantus 40 units SQ daily at bedtime. -He is also instructed to continue monitoring blood glucose twice daily, before meals and at bedtime and call the clinic if readings are less than 70 or greater than 200 for 3 tests in a row.  -In this patient  with high likelihood of pancreatic diabetes, tight glycemic profile is not advised as he may be lacking endogenous glucagon response which puts him at high risk for hypoglycemia.  - he is not a candidate for Metformin, SGLT2 inhibitors due to concurrent renal insufficiency. -He is not a candidate for incretin therapy due to his background history of alcoholism with possible risk of pancreatitis.  - Patient specific target  A1c;  LDL, HDL, Triglycerides, and  Waist Circumference were discussed in detail.  2) Blood Pressure /Hypertension: -His blood pressure readings from previous visits is controlled.  He does not have a way of measuring BP at home.  He is advised to continue Metoprolol 100 mg po daily.  3) Lipids/Hyperlipidemia: His most recent lipid panel from 01/27/20 shows borderline LDL of 91 and improving triglycerides of 150.  He is advised to continue Lipitor 40 mg po daily at bedtime.  Side effects and precautions discussed with him.  He is advised to avoid fried foods and butter.  4)  Weight/Diet: -   CDE Consult has been initiated . Exercise, and detailed carbohydrates information provided  -  detailed on discharge instructions.  He cannot exercise optimally, wheelchair-bound due to bilateral above-knee amputations.  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 His previsit TFTs are consistent with slight over-replacement.  He reports compliance to this medication.  He is advised to lower his dose of Levothyroxine to 50 mcg po daily before breakfast.  Will recheck TFTs prior to next visit in 4 months.   - We discussed about the correct intake of his thyroid hormone, on empty stomach at fasting, with water, separated by at least 30 minutes from breakfast and other medications,  and separated by more than 4 hours from calcium, iron, multivitamins, acid reflux medications (PPIs). -Patient is made aware of the fact  that thyroid hormone replacement is needed for life, dose to be adjusted by periodic monitoring of thyroid function tests.  6) Vitamin D deficiency: -Last vitamin D level on 09/30/19 shows improvement- 52.9.  He is advised to continue vitamin D3 5000 units daily as maintenance dose until next measurement.  7) Chronic Care/Health Maintenance: -he  is on statin medications and  is encouraged to initiate and continue to follow up with Ophthalmology, Dentist,  Podiatrist at least yearly or according to recommendations, and advised to stay away from smoking. I have recommended yearly flu vaccine and pneumonia vaccine at least every 5 years;  and  sleep for at least 7 hours a day. -He cannot exercise optimally.  Patient does not have adequate social support, he has transportation concerns.  He may benefit from home assessment by Education officer, museum.  The patient was counseled on the dangers of tobacco use, and was advised to quit.  Reviewed strategies to maximize success, including removing cigarettes and smoking materials from environment.   - he is  advised to maintain close follow up with [name], MD for primary care needs, as well as his other providers for optimal and coordinated care.    I spent 30 minutes dedicated to the care of this patient on the date of this encounter to include pre-visit review of records, face-to-face time with the patient, and post visit ordering of  testing.   Please refer to Patient Instructions for Blood Glucose Monitoring and Insulin/Medications Dosing Guide"  in media tab for additional information. Please  also refer to " Patient Self Inventory" in the Media  tab for reviewed elements of pertinent patient history.  Todd [name] participated in the discussions, expressed understanding, and voiced agreement with the above plans.  All questions were answered to his satisfaction. he is encouraged to contact clinic should he have any questions or concerns prior to  his return visit.   Follow up plan: - Return in about 4 months (around 07/05/2020) for Diabetes follow up, Thyroid follow up, Previsit labs, Virtual visit ok.  Rayetta Pigg, [facility] [address] [place], [place] [address] Phone: [phone] Fax: [phone]  03/05/2020, 8:31 AM  This note was partially dictated with voice recognition software. Similar sounding words can be transcribed inadequately or may not  be corrected  upon review.

## 2020-03-07 DIAGNOSIS — Z89611 Acquired absence of right leg above knee: Secondary | ICD-10-CM | POA: Diagnosis not present

## 2020-03-07 DIAGNOSIS — Z89612 Acquired absence of left leg above knee: Secondary | ICD-10-CM | POA: Diagnosis not present

## 2020-03-09 ENCOUNTER — Ambulatory Visit: Payer: Medicare HMO | Admitting: *Deleted

## 2020-03-09 DIAGNOSIS — E1122 Type 2 diabetes mellitus with diabetic chronic kidney disease: Secondary | ICD-10-CM

## 2020-03-09 DIAGNOSIS — Z794 Long term (current) use of insulin: Secondary | ICD-10-CM | POA: Diagnosis not present

## 2020-03-09 DIAGNOSIS — I10 Essential (primary) hypertension: Secondary | ICD-10-CM

## 2020-03-09 DIAGNOSIS — N184 Chronic kidney disease, stage 4 (severe): Secondary | ICD-10-CM | POA: Diagnosis not present

## 2020-03-09 DIAGNOSIS — S31829A Unspecified open wound of left buttock, initial encounter: Secondary | ICD-10-CM

## 2020-03-09 NOTE — Patient Instructions (Signed)
Visit Information  Goals Addressed              This Visit's Progress     Patient Stated   .  "I need help with getting food" (pt-stated)        Poor appetite in patient with diabetes, chronic kidney disease, and bilateral AKA.  Current Barriers:  [name] Knowledge Deficits related to how to improve appetite . Deconditioning, poor health, alcohol use  Nurse Case Manager Clinical Goal(s):  [name] Over the next 90 days, patient will work with PCP regarding medical intervention to increase his appetite  Interventions:  . Chart reviewed  . Talked with patient by telephone . Chart reviewed including recent office, telephone notes, and letters . Confirmed that patient is working with [place] regarding food assistance and received letter with local food banks listed . Reviewed medications  . Instructed patient to reach out to PCP or RN CCM with any questions or concerns  Patient Self Care Activities:  . Needs assistance with food acquisition  . Can prepare food and feed himself  Please see past updates related to this goal by clicking on the "Past Updates" button in the selected goal      .  "I want the sores on my bottom to heal" (pt-stated)        CARE PLAN ENTRY (see longitudinal plan of care for additional care plan information)  Current Barriers:  . Care Coordination needs related to pressure ulcers in a patient with DM, HTN, and bilateral AKA . Transportation barriers  Nurse Case Manager Clinical Goal(s):  [name] Over the next 30 days, patient will continue to keep the area clean, dry, and follow wound care instructions given by provider . Over the next 30 days, patient will see improvement in wound healing . Over the next 30 days, patient will reach out to PCP with any new or worsening symptoms  Interventions:  . Inter-disciplinary care team collaboration (see longitudinal plan of care) . Chart reviewed including recent office notes, orders, telephone notes, and lab  results . Discussed wound care with patient and sister, [name] o Patient is not able to change dressing on his own but has an Aid that comes on Mon, Wed, and Fri o Dressing last changed on Fri and aid is scheduled to come today . Discussed wound and patient stated, "it is getting better" and that he has "some pain from time to time" . Discussed need for f/u appt with patient and sister, [name] . Collaborated with front office staff to have them contact sister, [name], to schedule appointment. She handles appointments and transportation.  . Encouraged patient to change positions at least every two hours to relieve pressure . Encouraged continued use of wheelchair cushion . Encouraged to reach out to PCP with any new or worsening symptoms . Encouraged patient to reach out to [facility] as needed  Patient Self Care Activities:  . Calls pharmacy for medication refills . Calls provider office for new concerns or questions . Unable to independently walk or drive but uses a wheelchair  Please see past updates related to this goal by clicking on the "Past Updates" button in the selected goal         Patient verbalizes understanding of instructions provided today.   Follow-up Plan The care management team will reach out to the patient again over the next 30 days.   Chong Sicilian, BSN, RN-BC Embedded [place] / [place]  Management Direct Dial: [phone]

## 2020-03-09 NOTE — Chronic Care Management (AMB) (Signed)
Chronic Care Management   Follow Up Note   03/09/2020 Name: Todd [name] MRN: [id number] DOB: [birth year]  Referred by: [name], MD Reason for referral : Chronic Care Management (RN follow up)   Ladarrion Telfair is a 64 y.o. year old male who is a primary care patient of [name], [name], MD. The CCM team was consulted for assistance with chronic disease management and care coordination needs.    Review of patient status, including review of consultants reports, relevant laboratory and other test results, and collaboration with appropriate care team members and the patient's provider was performed as part of comprehensive patient evaluation and provision of chronic care management services.    SDOH (Social Determinants of Health) assessments performed: Yes: transportation and aid services See Care Plan activities for detailed interventions related to [facility])     Outpatient Encounter Medications as of 03/09/2020  Medication Sig  . aspirin EC 81 MG tablet Take 81 mg by mouth daily.  [name] atorvastatin (LIPITOR) 40 MG tablet Take 1 tablet (40 mg total) by mouth daily at 6 PM. Needs to be seen for further refills.  . blood glucose meter kit and supplies Dispense based on patient and insurance preference. Use up to four times daily as directed. (FOR ICD-10 E10.9, E11.9).  . cetirizine (ZYRTEC) 10 MG tablet Take 1 tablet (10 mg total) by mouth daily. For allergy symptoms  . folic acid (FOLVITE) 1 MG tablet Take 1 tablet (1 mg total) by mouth daily.  [name] glucose blood (ACCU-CHEK AVIVA PLUS) test strip Check blood sugars four times a day  . Hydroactive Dressings (TEGADERM HYDROCOLLOID THIN) MISC Apply 1 each topically 2 (two) times a week.  . insulin glargine (LANTUS SOLOSTAR) 100 UNIT/ML Solostar Pen Inject 40 Units into the skin at bedtime.  . Insulin Pen Needle 31G X 5 MM MISC 1 Device by Does not apply route as directed.  [name] levothyroxine (SYNTHROID) 50 MCG tablet Take 1 tablet (50 mcg total)  by mouth daily before breakfast.  . megestrol (MEGACE) 400 MG/10ML suspension Take 10 mLs (400 mg total) by mouth 2 (two) times daily. For appetite stimulation  . metoprolol succinate (TOPROL-XL) 100 MG 24 hr tablet TAKE 1 TABLET BY MOUTH ONCE DAILY WITH MEALS OR  IMMEDIATELY  FOLLOWING  . sertraline (ZOLOFT) 50 MG tablet Take 1 tablet (50 mg total) by mouth at bedtime. For anxiety and depression  . Vitamin D, Ergocalciferol, (DRISDOL) 1.25 MG (50000 UNIT) CAPS capsule Take 1 capsule by mouth once a week   No facility-administered encounter medications on file as of 03/09/2020.     RN Care Plan: Goals Addressed              This Visit's Progress     Patient Stated   .  "I need help with getting food" (pt-stated)        Poor appetite in patient with diabetes, chronic kidney disease, and bilateral AKA.  Current Barriers:  [name] Knowledge Deficits related to how to improve appetite . Deconditioning, poor health, alcohol use  Nurse Case Manager Clinical Goal(s):  [name] Over the next 90 days, patient will work with PCP regarding medical intervention to increase his appetite  Interventions:  . Chart reviewed  . Talked with patient by telephone . Chart reviewed including recent office, telephone notes, and letters . Confirmed that patient is working with [place] regarding food assistance and received letter with local food banks listed . Reviewed medications  . Instructed patient to reach out  to PCP or RN CCM with any questions or concerns  Patient Self Care Activities:  . Needs assistance with food acquisition  . Can prepare food and feed himself  Please see past updates related to this goal by clicking on the "Past Updates" button in the selected goal      .  "I want the sores on my bottom to heal" (pt-stated)        CARE PLAN ENTRY (see longitudinal plan of care for additional care plan information)  Current Barriers:  . Care Coordination needs related to pressure ulcers in  a patient with DM, HTN, and bilateral AKA . Transportation barriers  Nurse Case Manager Clinical Goal(s):  [name] Over the next 30 days, patient will continue to keep the area clean, dry, and follow wound care instructions given by provider . Over the next 30 days, patient will see improvement in wound healing . Over the next 30 days, patient will reach out to PCP with any new or worsening symptoms  Interventions:  . Inter-disciplinary care team collaboration (see longitudinal plan of care) . Chart reviewed including recent office notes, orders, telephone notes, and lab results . Discussed wound care with patient and sister, [name] o Patient is not able to change dressing on his own but has an Aid that comes on Mon, Wed, and Fri o Dressing last changed on Fri and aid is scheduled to come today . Discussed wound and patient stated, "it is getting better" and that he has "some pain from time to time" . Discussed need for f/u appt with patient and sister, [name] . Collaborated with front office staff to have them contact sister, [name], to schedule appointment. She handles appointments and transportation.  . Encouraged patient to change positions at least every two hours to relieve pressure . Encouraged continued use of wheelchair cushion . Encouraged to reach out to PCP with any new or worsening symptoms . Encouraged patient to reach out to [facility] as needed  Patient Self Care Activities:  . Calls pharmacy for medication refills . Calls provider office for new concerns or questions . Unable to independently walk or drive but uses a wheelchair  Please see past updates related to this goal by clicking on the "Past Updates" button in the selected goal          Plan:   The care management team will reach out to the patient again over the next 30 days.    Chong Sicilian, BSN, RN-BC Embedded Chronic Care Manager [facility] [place] Management Direct Dial:  [phone]

## 2020-03-23 ENCOUNTER — Encounter: Payer: Self-pay | Admitting: Family Medicine

## 2020-03-23 ENCOUNTER — Ambulatory Visit (INDEPENDENT_AMBULATORY_CARE_PROVIDER_SITE_OTHER): Payer: Medicare HMO | Admitting: Family Medicine

## 2020-03-23 ENCOUNTER — Other Ambulatory Visit: Payer: Self-pay

## 2020-03-23 VITALS — BP 131/82 | HR 66 | Temp 96.9°F

## 2020-03-23 DIAGNOSIS — Z89611 Acquired absence of right leg above knee: Secondary | ICD-10-CM

## 2020-03-23 DIAGNOSIS — Z89612 Acquired absence of left leg above knee: Secondary | ICD-10-CM | POA: Diagnosis not present

## 2020-03-23 DIAGNOSIS — L89159 Pressure ulcer of sacral region, unspecified stage: Secondary | ICD-10-CM

## 2020-03-23 DIAGNOSIS — I10 Essential (primary) hypertension: Secondary | ICD-10-CM | POA: Diagnosis not present

## 2020-03-23 NOTE — Progress Notes (Signed)
Subjective:  Patient ID: Todd [name], male    DOB: 02/08/1956  Age: 64 y.o. MRN: [id number]  CC: Follow-up   HPI Todd [name] presents for recheck of the sacral wound has completely healed. He does not want it checked because of the difficulty it poses for positioning as a result of bilateral AKA.   Depression screen [facility] 2/9 03/23/2020 01/27/2020 12/06/2019  Decreased Interest 0 0 0  Down, Depressed, Hopeless 0 0 0  PHQ - 2 Score 0 0 0  Altered sleeping - - -  Tired, decreased energy - - -  Change in appetite - - -  Feeling bad or failure about yourself  - - -  Trouble concentrating - - -  Moving slowly or fidgety/restless - - -  Suicidal thoughts - - -  PHQ-9 Score - - -  Difficult doing work/chores - - -  Some recent data might be hidden    History Todd [name] has a past medical history of AKI (acute kidney injury) ([employer]), Constipated, Diabetes mellitus without complication ([place]), Diarrhea, Elevated LFTs, Goiter, Gout, Hyperlipidemia, Hypertension, Leukocytosis, Reactive thrombocytosis, Right BKA infection ([place]) (08/2016), Right leg pain, Sepsis due to undetermined organism [facility]), Thyroid disease, and Wound infection after surgery (08/2016).   He has a past surgical history that includes Thyroid surgery; Lower Extremity Angiography (Bilateral, 08/11/2016); ABDOMINAL AORTOGRAM (N/A, 08/11/2016); PERIPHERAL VASCULAR BALLOON ANGIOPLASTY (Left, 08/11/2016); ABDOMINAL AORTOGRAM W/LOWER EXTREMITY (N/A, 08/15/2016); Amputation (Right, 08/17/2016); Amputation (Right, 09/12/2016); Application if wound vac (Right, 09/12/2016); Amputation (Left, 08/12/2016); and Amputation (Left, 11/01/2017).   His family history includes Diabetes in his maternal aunt and maternal uncle; Heart disease in his mother; Pneumonia in his father.He reports that he has been smoking cigarettes. He has a 11.25 pack-year smoking history. He has never used smokeless tobacco. He reports previous alcohol use. He reports that he does not  use drugs.    ROS Review of Systems  Constitutional: Negative for fever.  Respiratory: Negative for shortness of breath.   Cardiovascular: Negative for chest pain.  Musculoskeletal: Negative for arthralgias.  Skin: Negative for rash.    Objective:  BP 131/82   Pulse 66   Temp (!) 96.9 F (36.1 C) (Temporal)   BP Readings from Last 3 Encounters:  03/23/20 131/82  02/14/20 115/68  01/27/20 113/78    Wt Readings from Last 3 Encounters:  02/15/18 134 lb 14.7 oz (61.2 kg)  02/05/18 138 lb 14.2 oz (63 kg)  11/30/17 145 lb (65.8 kg)     Physical Exam Vitals reviewed.  Constitutional:      Appearance: He is well-developed.  HENT:     Head: Normocephalic and atraumatic.     Right Ear: External ear normal.     Left Ear: External ear normal.     Mouth/Throat:     Pharynx: No oropharyngeal exudate or posterior oropharyngeal erythema.  Eyes:     Pupils: Pupils are equal, round, and reactive to light.  Cardiovascular:     Rate and Rhythm: Normal rate and regular rhythm.     Heart sounds: No murmur heard.   Pulmonary:     Effort: No respiratory distress.     Breath sounds: Normal breath sounds.  Musculoskeletal:     Cervical back: Normal range of motion and neck supple.     Comments: Wheelchair bound, bilateral AKA   Neurological:     Mental Status: He is alert and oriented to person, place, and time.       Assessment & Plan:  Todd [name] was seen today for follow-up.  Diagnoses and all orders for this visit:  S/P AKA (above knee amputation) bilateral (HCC)  Pressure injury of skin of sacral region, unspecified injury stage  Essential hypertension, benign       I am having Todd [name] maintain his Insulin Pen Needle, blood glucose meter kit and supplies, folic acid, glucose blood, aspirin EC, sertraline, cetirizine, atorvastatin, Vitamin D (Ergocalciferol), Lantus SoloStar, megestrol, metoprolol succinate, Tegaderm Hydrocolloid Thin, and  levothyroxine.  Allergies as of 03/23/2020      Reactions   Lisinopril Other (See Comments)   Hyperkalemia / Renal failure      Medication List       Accurate as of March 23, 2020 11:59 PM. If you have any questions, ask your nurse or doctor.        aspirin EC 81 MG tablet Take 81 mg by mouth daily.   atorvastatin 40 MG tablet Commonly known as: LIPITOR Take 1 tablet (40 mg total) by mouth daily at 6 PM. Needs to be seen for further refills.   blood glucose meter kit and supplies Dispense based on patient and insurance preference. Use up to four times daily as directed. (FOR ICD-10 E10.9, E11.9).   cetirizine 10 MG tablet Commonly known as: ZYRTEC Take 1 tablet (10 mg total) by mouth daily. For allergy symptoms   folic acid 1 MG tablet Commonly known as: FOLVITE Take 1 tablet (1 mg total) by mouth daily.   glucose blood test strip Commonly known as: Accu-Chek Aviva Plus Check blood sugars four times a day   Insulin Pen Needle 31G X 5 MM Misc 1 Device by Does not apply route as directed.   Lantus SoloStar 100 UNIT/ML Solostar Pen Generic drug: insulin glargine Inject 40 Units into the skin at bedtime.   levothyroxine 50 MCG tablet Commonly known as: SYNTHROID Take 1 tablet (50 mcg total) by mouth daily before breakfast.   megestrol 400 MG/10ML suspension Commonly known as: MEGACE Take 10 mLs (400 mg total) by mouth 2 (two) times daily. For appetite stimulation   metoprolol succinate 100 MG 24 hr tablet Commonly known as: TOPROL-XL TAKE 1 TABLET BY MOUTH ONCE DAILY WITH MEALS OR  IMMEDIATELY  FOLLOWING   sertraline 50 MG tablet Commonly known as: ZOLOFT Take 1 tablet (50 mg total) by mouth at bedtime. For anxiety and depression   Tegaderm Hydrocolloid Thin Misc Apply 1 each topically 2 (two) times a week.   Vitamin D (Ergocalciferol) 1.25 MG (50000 UNIT) Caps capsule Commonly known as: DRISDOL Take 1 capsule by mouth once a week        Follow-up:  Return in about 6 months (around 09/21/2020).  [name], M.D.

## 2020-03-26 ENCOUNTER — Ambulatory Visit: Payer: Medicare HMO | Admitting: Orthopedic Surgery

## 2020-03-26 ENCOUNTER — Telehealth: Payer: Self-pay | Admitting: Orthopedic Surgery

## 2020-03-26 NOTE — Telephone Encounter (Signed)
Patient's sister [name] called requesting a call back. [name] states that she has some information for Autumn F. [name] phone number is [phone].

## 2020-03-26 NOTE — Telephone Encounter (Signed)
Called pt's sister who is on [employer] [name]) he is s/p a left AKA on 11/01/17 and his last office visit was 01/01/18.

## 2020-03-26 NOTE — Telephone Encounter (Signed)
Pt's sister will call to sch appt for pt to being the prosthetic process again.

## 2020-03-28 DIAGNOSIS — Z89612 Acquired absence of left leg above knee: Secondary | ICD-10-CM | POA: Diagnosis not present

## 2020-03-28 DIAGNOSIS — Z89611 Acquired absence of right leg above knee: Secondary | ICD-10-CM | POA: Diagnosis not present

## 2020-03-29 ENCOUNTER — Encounter: Payer: Self-pay | Admitting: Family Medicine

## 2020-04-02 ENCOUNTER — Other Ambulatory Visit: Payer: Self-pay | Admitting: Family Medicine

## 2020-04-03 ENCOUNTER — Ambulatory Visit: Payer: Medicare HMO | Admitting: *Deleted

## 2020-04-03 DIAGNOSIS — E1122 Type 2 diabetes mellitus with diabetic chronic kidney disease: Secondary | ICD-10-CM

## 2020-04-03 DIAGNOSIS — I10 Essential (primary) hypertension: Secondary | ICD-10-CM

## 2020-04-03 DIAGNOSIS — Z794 Long term (current) use of insulin: Secondary | ICD-10-CM

## 2020-04-03 DIAGNOSIS — L89159 Pressure ulcer of sacral region, unspecified stage: Secondary | ICD-10-CM

## 2020-04-03 NOTE — Patient Instructions (Signed)
Visit Information  Goals Addressed              This Visit's Progress     Patient Stated   .  COMPLETED: "I need help with getting food" (pt-stated)        Poor appetite in patient with diabetes, chronic kidney disease, and bilateral AKA.  Current Barriers:  [name] Knowledge Deficits related to how to improve appetite . Deconditioning, poor health, alcohol use  Nurse Case Manager Clinical Goal(s):  [name] Over the next 90 days, patient will work with PCP regarding medical intervention to increase his appetite  Interventions:  . Chart reviewed  . Talked with patient by telephone . Chart reviewed including recent office, telephone notes, and letters . Confirmed that patient is working with [place] regarding food assistance and received letter with local food banks listed . Reviewed medications  . Instructed patient to reach out to PCP or RN CCM with any questions or concerns  Patient Self Care Activities:  . Needs assistance with food acquisition  . Can prepare food and feed himself  Please see past updates related to this goal by clicking on the "Past Updates" button in the selected goal      .  COMPLETED: "I want the sores on my bottom to heal" (pt-stated)        CARE PLAN ENTRY (see longitudinal plan of care for additional care plan information)  Current Barriers:  . Care Coordination needs related to pressure ulcers in a patient with DM, HTN, and bilateral AKA . Transportation barriers  Nurse Case Manager Clinical Goal(s):  [name] Over the next 90 days, patient will reach out to PCP with any new or worsening symptoms  Interventions:  . Inter-disciplinary care team collaboration (see longitudinal plan of care) . Chart reviewed including recent office notes, orders, telephone notes, and lab results . Discussed office visit with Dr Livia Snellen on 03/23/20 . Discussed sacral wound with patient. He reports that it has healed and does not cause any pain.  . Talked with patient  about assistance with ADLs o He has an aide that comes 3 days a week. She gives him a bath and is able to assess sacral area for redevelopment of wound. . Encouraged patient to change positions at least every two hours to relieve pressure . Encouraged continued use of wheelchair cushion . Encouraged to reach out to PCP with any new or worsening symptoms . Encouraged patient to reach out to [facility] as needed  Patient Self Care Activities:  . Calls pharmacy for medication refills . Calls provider office for new concerns or questions . Unable to independently walk or drive but uses a wheelchair  Please see past updates related to this goal by clicking on the "Past Updates" button in the selected goal      .  "I want to keep my blood sugar under control" (pt-stated)        [place] (see longitudinal plan of care for additional care plan information)  Current Barriers:  . Chronic Disease Management support and education needs related to diabetes . Wheelchair bound bilateral AKA  Nurse Case Manager Clinical Goal(s):  [name] Over the next 90 days, patient will keep all medical appointments . Over the next 90 days, patient will demonstrate continued self-management of diabetes as evidenced by an A1C within provider recommended range . Over the next 90 days, patient will take all medications as prescribed  Interventions:  . Inter-disciplinary care team collaboration (see longitudinal  plan of care) . Evaluation of current treatment plan related to diabetes and patient's adherence to plan as established by provider. . Reviewed and discussed medications with patient . Chart reviewed including recent office notes and lab results . Discussed most recent A1C results . Discussed diet . Discussed plans with patient for ongoing care management follow up and provided patient with direct contact information for care management team . Reviewed scheduled/upcoming provider appointments including: 09/22/20  with Dr Livia Snellen . Advised patient, providing education and rationale, to check cbg twice daily and record, calling [phone] for findings outside established parameters.    Patient Self Care Activities:  . Self administers medications as prescribed . Attends all scheduled provider appointments . Calls provider office for new concerns or questions . Unable to independently drive  Initial goal documentation       Other   .  "I don't want to fall"        Current Barriers:  [name] Knowledge Deficits related to fall precautions . Decreased adherence to prescribed treatment for fall prevention  Nurse Case Manager Clinical Goal(s):  [name] Over the next 30 days, patient will demonstrate improved adherence to prescribed treatment plan for decreasing falls as evidenced by patient reporting and review of EMR . Over the next 30days, patient will verbalize using fall risk reduction strategies discussed . Over the next 90 days, patient will not experience additional falls  Interventions:  . Provided verbal education re: Potential causes of falls and Fall prevention strategies . Assessed for falls since last encounter. . Previously discussed that patient lives in a handicap accessible apartment . Discussed that he has an aid that comes 3 days a week . Assessed patients knowledge of fall risk prevention secondary to previously provided education. . Previously confirmed that patient has life alert system  Patient Self Care Activities:  . Utilize wheelchair (assistive device) appropriately with all ambulation . De-clutter walkways . Change positions slowly . Wear secure fitting shoes at all times with ambulation . Utilize home lighting for dim lit areas . Have self and pet awareness at all times  Please see past updates related to this goal by clicking on the "Past Updates" button in the selected goal         Patient verbalizes understanding of instructions provided today.   Follow-up  Plan  The care management team will reach out to the patient again over the next 60 days.  Next PCP appointment scheduled for: 09/22/20 with Dr Livia Snellen  Chong Sicilian, BSN, RN-BC [place] / [place] Management Direct Dial: [phone]

## 2020-04-03 NOTE — Chronic Care Management (AMB) (Signed)
Chronic Care Management   Follow Up Note   04/03/2020 Name: Todd [name] MRN: [id number] DOB: 06/18/1955  Referred by: [name], MD Reason for referral : Chronic Care Management (RN follow up)   Todd [name] is a 64 y.o. year old male who is a primary care patient of [name], [name], MD. The CCM team was consulted for assistance with chronic disease management and care coordination needs.    Review of patient status, including review of consultants reports, relevant laboratory and other test results, and collaboration with appropriate care team members and the patient's provider was performed as part of comprehensive patient evaluation and provision of chronic care management services.    SDOH (Social Determinants of Health) assessments performed: No See Care Plan activities for detailed interventions related to [facility])     Outpatient Encounter Medications as of 04/03/2020  Medication Sig  . aspirin EC 81 MG tablet Take 81 mg by mouth daily.  [name] atorvastatin (LIPITOR) 40 MG tablet Take 1 tablet (40 mg total) by mouth daily at 6 PM. Needs to be seen for further refills.  . blood glucose meter kit and supplies Dispense based on patient and insurance preference. Use up to four times daily as directed. (FOR ICD-10 E10.9, E11.9).  . cetirizine (ZYRTEC) 10 MG tablet Take 1 tablet (10 mg total) by mouth daily. For allergy symptoms  . folic acid (FOLVITE) 1 MG tablet Take 1 tablet (1 mg total) by mouth daily.  [name] glucose blood (ACCU-CHEK AVIVA PLUS) test strip Check blood sugars four times a day  . Hydroactive Dressings (TEGADERM HYDROCOLLOID THIN) MISC Apply 1 each topically 2 (two) times a week.  . insulin glargine (LANTUS SOLOSTAR) 100 UNIT/ML Solostar Pen Inject 40 Units into the skin at bedtime.  . Insulin Pen Needle 31G X 5 MM MISC 1 Device by Does not apply route as directed.  [name] levothyroxine (SYNTHROID) 50 MCG tablet Take 1 tablet (50 mcg total) by mouth daily before breakfast.   . megestrol (MEGACE) 400 MG/10ML suspension Take 10 mLs (400 mg total) by mouth 2 (two) times daily. For appetite stimulation  . metoprolol succinate (TOPROL-XL) 100 MG 24 hr tablet TAKE 1 TABLET BY MOUTH ONCE DAILY WITH MEALS OR  IMMEDIATELY  FOLLOWING  . sertraline (ZOLOFT) 50 MG tablet Take 1 tablet (50 mg total) by mouth at bedtime. For anxiety and depression  . Vitamin D, Ergocalciferol, (DRISDOL) 1.25 MG (50000 UNIT) CAPS capsule Take 1 capsule by mouth once a week   No facility-administered encounter medications on file as of 04/03/2020.     Lab Results  Component Value Date   HGBA1C 6.5 01/27/2020   HGBA1C 6.8 09/30/2019   HGBA1C 7.1 (A) 06/17/2019   Lab Results  Component Value Date   LDLCALC 91 01/27/2020   CREATININE 1.27 01/27/2020     Goals Addressed              This Visit's Progress     Patient Stated   .  COMPLETED: "I want the sores on my bottom to heal" (pt-stated)        CARE PLAN ENTRY (see longitudinal plan of care for additional care plan information)  Current Barriers:  . Care Coordination needs related to pressure ulcers in a patient with DM, HTN, and bilateral AKA . Transportation barriers  Nurse Case Manager Clinical Goal(s):  [name] Over the next 90 days, patient will reach out to PCP with any new or worsening symptoms  Interventions:  . Inter-disciplinary care team  collaboration (see longitudinal plan of care) . Chart reviewed including recent office notes, orders, telephone notes, and lab results . Discussed office visit with Dr Livia Snellen on 03/23/20 . Discussed sacral wound with patient. He reports that it has healed and does not cause any pain.  . Talked with patient about assistance with ADLs o He has an aide that comes 3 days a week. She gives him a bath and is able to assess sacral area for redevelopment of wound. . Encouraged patient to change positions at least every two hours to relieve pressure . Encouraged continued use of wheelchair  cushion . Encouraged to reach out to PCP with any new or worsening symptoms . Encouraged patient to reach out to [facility] as needed  Patient Self Care Activities:  . Calls pharmacy for medication refills . Calls provider office for new concerns or questions . Unable to independently walk or drive but uses a wheelchair  Please see past updates related to this goal by clicking on the "Past Updates" button in the selected goal      .  "I want to keep my blood sugar under control" (pt-stated)        [place] (see longitudinal plan of care for additional care plan information)  Current Barriers:  . Chronic Disease Management support and education needs related to diabetes . Wheelchair bound bilateral AKA  Nurse Case Manager Clinical Goal(s):  [name] Over the next 90 days, patient will keep all medical appointments . Over the next 90 days, patient will demonstrate continued self-management of diabetes as evidenced by an A1C within provider recommended range . Over the next 90 days, patient will take all medications as prescribed  Interventions:  . Inter-disciplinary care team collaboration (see longitudinal plan of care) . Evaluation of current treatment plan related to diabetes and patient's adherence to plan as established by provider. . Reviewed and discussed medications with patient . Chart reviewed including recent office notes and lab results . Discussed most recent A1C results . Discussed diet . Discussed plans with patient for ongoing care management follow up and provided patient with direct contact information for care management team . Reviewed scheduled/upcoming provider appointments including: 09/22/20 with Dr Livia Snellen . Advised patient, providing education and rationale, to check cbg twice daily and record, calling [phone] for findings outside established parameters.    Patient Self Care Activities:  . Self administers medications as prescribed . Attends all scheduled  provider appointments . Calls provider office for new concerns or questions . Unable to independently drive  Initial goal documentation       Other   .  "I don't want to fall"        Current Barriers:  [name] Knowledge Deficits related to fall precautions . Decreased adherence to prescribed treatment for fall prevention  Nurse Case Manager Clinical Goal(s):  [name] Over the next 30 days, patient will demonstrate improved adherence to prescribed treatment plan for decreasing falls as evidenced by patient reporting and review of EMR . Over the next 30days, patient will verbalize using fall risk reduction strategies discussed . Over the next 90 days, patient will not experience additional falls  Interventions:  . Provided verbal education re: Potential causes of falls and Fall prevention strategies . Assessed for falls since last encounter. . Previously discussed that patient lives in a handicap accessible apartment . Discussed that he has an aid that comes 3 days a week . Assessed patients knowledge of fall risk prevention secondary to previously provided  education. . Previously confirmed that patient has life alert system  Patient Self Care Activities:  . Utilize wheelchair (assistive device) appropriately with all ambulation . De-clutter walkways . Change positions slowly . Wear secure fitting shoes at all times with ambulation . Utilize home lighting for dim lit areas . Have self and pet awareness at all times  Please see past updates related to this goal by clicking on the "Past Updates" button in the selected goal          Plan:   The care management team will reach out to the patient again over the next 60 days.  Next PCP appointment scheduled for: 09/22/20 with Dr Livia Snellen  Chong Sicilian, BSN, RN-BC [place] [facility] [place] Management Direct Dial: [phone]

## 2020-04-06 ENCOUNTER — Ambulatory Visit: Payer: Medicare HMO | Admitting: Licensed Clinical Social Worker

## 2020-04-06 DIAGNOSIS — N183 Chronic kidney disease, stage 3 unspecified: Secondary | ICD-10-CM

## 2020-04-06 DIAGNOSIS — N184 Chronic kidney disease, stage 4 (severe): Secondary | ICD-10-CM

## 2020-04-06 DIAGNOSIS — Z89611 Acquired absence of right leg above knee: Secondary | ICD-10-CM | POA: Diagnosis not present

## 2020-04-06 DIAGNOSIS — Z794 Long term (current) use of insulin: Secondary | ICD-10-CM

## 2020-04-06 DIAGNOSIS — E782 Mixed hyperlipidemia: Secondary | ICD-10-CM

## 2020-04-06 DIAGNOSIS — E1122 Type 2 diabetes mellitus with diabetic chronic kidney disease: Secondary | ICD-10-CM

## 2020-04-06 DIAGNOSIS — Z89612 Acquired absence of left leg above knee: Secondary | ICD-10-CM | POA: Diagnosis not present

## 2020-04-06 DIAGNOSIS — I10 Essential (primary) hypertension: Secondary | ICD-10-CM

## 2020-04-06 NOTE — Chronic Care Management (AMB) (Signed)
Chronic Care Management    Clinical Social Work Follow Up Note  04/06/2020 Name: Todd [name] MRN: [id number] DOB: 1956-04-17  Todd [name] is a 64 y.o. year old male who is a primary care patient of [name], [name], MD. The CCM team was consulted for assistance with [name] .   Review of patient status, including review of consultants reports, other relevant assessments, and collaboration with appropriate care team members and the patient's provider was performed as part of comprehensive patient evaluation and provision of chronic care management services.    SDOH (Social Determinants of Health) assessments performed: No;risk for depression; risk for stress; risk for social isolation; risk for tobacco use; risk for physical inactivity    Chronic Care Management from 07/03/2019 in [facility]  PHQ-9 Total Score 6       GAD 7 : Generalized Anxiety Score 07/03/2019  Nervous, Anxious, on Edge 1  Control/stop worrying 0  Worry too much - different things 1  Trouble relaxing 0  Restless 1  Easily annoyed or irritable 1  Afraid - awful might happen 1  Total GAD 7 Score 5  Anxiety Difficulty Somewhat difficult    Outpatient Encounter Medications as of 04/06/2020  Medication Sig  . aspirin EC 81 MG tablet Take 81 mg by mouth daily.  [name] atorvastatin (LIPITOR) 40 MG tablet Take 1 tablet (40 mg total) by mouth daily at 6 PM. Needs to be seen for further refills.  . blood glucose meter kit and supplies Dispense based on patient and insurance preference. Use up to four times daily as directed. (FOR ICD-10 E10.9, E11.9).  . cetirizine (ZYRTEC) 10 MG tablet Take 1 tablet (10 mg total) by mouth daily. For allergy symptoms  . folic acid (FOLVITE) 1 MG tablet Take 1 tablet (1 mg total) by mouth daily.  [name] glucose blood (ACCU-CHEK AVIVA PLUS) test strip Check blood sugars four times a day  . Hydroactive Dressings (TEGADERM HYDROCOLLOID THIN) MISC Apply 1 each  topically 2 (two) times a week.  . insulin glargine (LANTUS SOLOSTAR) 100 UNIT/ML Solostar Pen Inject 40 Units into the skin at bedtime.  . Insulin Pen Needle 31G X 5 MM MISC 1 Device by Does not apply route as directed.  [name] levothyroxine (SYNTHROID) 50 MCG tablet Take 1 tablet (50 mcg total) by mouth daily before breakfast.  . megestrol (MEGACE) 400 MG/10ML suspension Take 10 mLs (400 mg total) by mouth 2 (two) times daily. For appetite stimulation  . metoprolol succinate (TOPROL-XL) 100 MG 24 hr tablet TAKE 1 TABLET BY MOUTH ONCE DAILY WITH MEALS OR  IMMEDIATELY  FOLLOWING  . sertraline (ZOLOFT) 50 MG tablet Take 1 tablet (50 mg total) by mouth at bedtime. For anxiety and depression  . Vitamin D, Ergocalciferol, (DRISDOL) 1.25 MG (50000 UNIT) CAPS capsule Take 1 capsule by mouth once a week   No facility-administered encounter medications on file as of 04/06/2020.    Goals    .  Client wants to talk with LCSW in next 30 days to discuss grief issues of clinet related to recent death of his mother (his mother died on 06/16/19) (pt-stated)      Current Barriers:  [name] Mobility challenges in client with Chronic Diagnoses of HTN, Type 2 DM, CKD, HLD  Clinical Social Work Clinical Goal(s):  [name] LCSW will talk with client in next 30 days to discuss client management of grief issues faced  Interventions:   Talked with client about CCM support services  Talked with client about upcoming client appointments  Talked with client about food procurement for client  Talked with client about care provided weekly with home health aide as scheduled  Talked with client about pain issues of client  Talked with client about vision of client  Talked with client about sleeping issues of client  Talked with client about socializing with his friends  Talked with client about grief symptoms of client  Talked with client about support he receives from his two sisters)  Patient Self Care Activities:   Takes medications as prescribed   Self Care Deficit:    Mobility challenges Alcohol use issue     Initial goal documentation     Follow Up Plan:  LCSW to call client or sister of client in next 4 weeks to talk about client grief issues faced  Rudie Sermons.Mendel Binsfeld MSW, LCSW Licensed Clinical Social Worker [facility] Care Management [phone]

## 2020-04-06 NOTE — Patient Instructions (Addendum)
Licensed Clinical Social Worker Visit Information  Goals we discussed today:   .  Client wants to talk with LCSW in next 30 days to discuss grief issues of clinet related to recent death of his mother (his mother died on 2019-06-19) (pt-stated)        Current Barriers:   Mobility challenges in client with Chronic Diagnoses of HTN, Type 2 DM, CKD, HLD  Clinical Social Work Clinical Goal(s):   LCSW will talk with client in next 30 days to discuss client management of grief issues faced  Interventions:   Talked with client about CCM support services   Talked with client about upcoming client appointments  Talked with client about food procurement for client  Talked with client about care provided weekly with home health aide as scheduled  Talked with client about pain issues of client  Talked with client about vision of client  Talked with client about sleeping issues of client  Talked with client about socializing with his friends  Talked with client about grief symptoms of client  Talked with client about support he receives from his two sisters)  Patient Self Care Activities: Takes medications as prescribed  Self Care Deficit:  Mobility challenges Alcohol use issue   Initial goal documentation   Follow Up Plan: LCSW to call client or sister of client in next 4 weeks to talk about client grief issues faced  Materials Provided: No  The patient verbalized understanding of instructions provided today and declined a print copy of patient instruction materials.   [name].Tarisha Fader MSW, LCSW Licensed Clinical Social Worker [facility] Care Management [phone]

## 2020-04-28 ENCOUNTER — Ambulatory Visit: Payer: Medicare HMO | Admitting: Family Medicine

## 2020-04-28 DIAGNOSIS — Z89612 Acquired absence of left leg above knee: Secondary | ICD-10-CM | POA: Diagnosis not present

## 2020-04-28 DIAGNOSIS — Z89611 Acquired absence of right leg above knee: Secondary | ICD-10-CM | POA: Diagnosis not present

## 2020-05-05 ENCOUNTER — Telehealth: Payer: Medicare HMO

## 2020-05-07 DIAGNOSIS — Z89612 Acquired absence of left leg above knee: Secondary | ICD-10-CM | POA: Diagnosis not present

## 2020-05-07 DIAGNOSIS — Z89611 Acquired absence of right leg above knee: Secondary | ICD-10-CM | POA: Diagnosis not present

## 2020-05-26 ENCOUNTER — Ambulatory Visit: Payer: Medicare HMO | Admitting: *Deleted

## 2020-05-26 DIAGNOSIS — N184 Chronic kidney disease, stage 4 (severe): Secondary | ICD-10-CM

## 2020-05-26 DIAGNOSIS — E1122 Type 2 diabetes mellitus with diabetic chronic kidney disease: Secondary | ICD-10-CM

## 2020-05-26 DIAGNOSIS — I10 Essential (primary) hypertension: Secondary | ICD-10-CM

## 2020-05-26 DIAGNOSIS — Z794 Long term (current) use of insulin: Secondary | ICD-10-CM

## 2020-05-26 DIAGNOSIS — Z89611 Acquired absence of right leg above knee: Secondary | ICD-10-CM

## 2020-05-26 NOTE — Chronic Care Management (AMB) (Signed)
Chronic Care Management   Follow Up Note   05/26/2020 Name: Todd [name] MRN: [id number] DOB: 16-Jan-1956  Referred by: [name], MD Reason for referral : Chronic Care Management (RN follow up)   Todd [name] is a 64 y.o. year old male who is a primary care patient of [name], [name], MD. The CCM team was consulted for assistance with chronic disease management and care coordination needs.    Review of patient status, including review of consultants reports, relevant laboratory and other test results, and collaboration with appropriate care team members and the patient's provider was performed as part of comprehensive patient evaluation and provision of chronic care management services.    SDOH (Social Determinants of Health) assessments performed: Yes See Care Plan activities for detailed interventions related to [facility])     Outpatient Encounter Medications as of 05/26/2020  Medication Sig  . aspirin EC 81 MG tablet Take 81 mg by mouth daily.  [name] atorvastatin (LIPITOR) 40 MG tablet Take 1 tablet (40 mg total) by mouth daily at 6 PM. Needs to be seen for further refills.  . blood glucose meter kit and supplies Dispense based on patient and insurance preference. Use up to four times daily as directed. (FOR ICD-10 E10.9, E11.9).  . cetirizine (ZYRTEC) 10 MG tablet Take 1 tablet (10 mg total) by mouth daily. For allergy symptoms  . folic acid (FOLVITE) 1 MG tablet Take 1 tablet (1 mg total) by mouth daily.  [name] glucose blood (ACCU-CHEK AVIVA PLUS) test strip Check blood sugars four times a day  . Hydroactive Dressings (TEGADERM HYDROCOLLOID THIN) MISC Apply 1 each topically 2 (two) times a week.  . insulin glargine (LANTUS SOLOSTAR) 100 UNIT/ML Solostar Pen Inject 40 Units into the skin at bedtime.  . Insulin Pen Needle 31G X 5 MM MISC 1 Device by Does not apply route as directed.  [name] levothyroxine (SYNTHROID) 50 MCG tablet Take 1 tablet (50 mcg total) by mouth daily before  breakfast.  . megestrol (MEGACE) 400 MG/10ML suspension Take 10 mLs (400 mg total) by mouth 2 (two) times daily. For appetite stimulation  . metoprolol succinate (TOPROL-XL) 100 MG 24 hr tablet TAKE 1 TABLET BY MOUTH ONCE DAILY WITH MEALS OR  IMMEDIATELY  FOLLOWING  . sertraline (ZOLOFT) 50 MG tablet Take 1 tablet (50 mg total) by mouth at bedtime. For anxiety and depression  . Vitamin D, Ergocalciferol, (DRISDOL) 1.25 MG (50000 UNIT) CAPS capsule Take 1 capsule by mouth once a week   No facility-administered encounter medications on file as of 05/26/2020.      Goals Addressed              This Visit's Progress     Patient Stated   .  "I want to keep my blood sugar under control" (pt-stated)        CARE PLAN ENTRY (see longitudinal plan of care for additional care plan information)  Current Barriers:  . Chronic Disease Management support and education needs related to diabetes in a patient with HTN and CKD . Wheelchair bound bilateral AKA  Nurse Case Manager Clinical Goal(s):  [name] Over the next 90 days, patient will keep all medical appointments . Over the next 90 days, patient will demonstrate continued self-management of diabetes as evidenced by an A1C within provider recommended range . Over the next 90 days, patient will take all medications as prescribed  Interventions:  . Inter-disciplinary care team collaboration (see longitudinal plan of care) . Evaluation of current treatment plan  related to diabetes and patient's adherence to plan as established by provider. . Reviewed medications. Patient is taking as instructed.  . Chart reviewed including relevant office notes and lab results . Discussed most recent A1C results . Discussed diet . Discussed family and social support o Has an aide that comes in 3 days a week o Talks with sisters often . Discussed plans with patient for ongoing care management follow up and provided patient with direct contact information for care  management team . Reviewed scheduled/upcoming provider appointments including:  o 09/22/20 with Dr Livia Snellen o 07/08/20 with endocrinologist o 06/16/20 with LCSW . Advised patient, providing education and rationale, to check cbg twice daily and record, calling [phone] for findings outside established parameters.    Patient Self Care Activities:  . Self administers medications as prescribed . Attends all scheduled provider appointments . Calls provider office for new concerns or questions . Unable to independently drive  Please see past updates related to this goal by clicking on the "Past Updates" button in the selected goal        Other   .  "I don't want to fall"        Current Barriers:  [name] Knowledge Deficits related to fall precautions . Decreased adherence to prescribed treatment for fall prevention  Nurse Case Manager Clinical Goal(s):  [name] Over the next 30 days, patient will demonstrate improved adherence to prescribed treatment plan for decreasing falls as evidenced by patient reporting and review of EMR . Over the next 30days, patient will verbalize using fall risk reduction strategies discussed . Over the next 90 days, patient will not experience additional falls  Interventions:  . Provided verbal education re: Potential causes of falls and Fall prevention strategies . Assessed for falls since last encounter . Discussed use of wheelchair . Discussed ability to perform ADLs o patient lives in a handicap accessible apartment . Discussed that he has an aid that comes 3 days a week . Discussed family and social support . Assessed patients knowledge of fall risk prevention secondary to previously provided education. . Previously confirmed that patient has life alert system  Patient Self Care Activities:  . Utilize wheelchair (assistive device) appropriately with all ambulation . De-clutter walkways . Change positions slowly . Wear secure fitting shoes at all times with  ambulation . Utilize home lighting for dim lit areas . Have self and pet awareness at all times  Please see past updates related to this goal by clicking on the "Past Updates" button in the selected goal           Plan:  Telephone follow up appointment with care management team member scheduled for: RN Care Manager 07/08/19, LCSW 06/16/20 The patient has been provided with contact information for the care management team and has been advised to call with any health related questions or concerns.  Next PCP appointment scheduled for: 09/22/20 with Dr Livia Snellen Keep appt with endocrinologist on 07/08/20   Chong Sicilian, BSN, RN-BC [place] [facility] [place] Management Direct Dial: [phone]

## 2020-05-26 NOTE — Patient Instructions (Signed)
Visit Information  Goals Addressed              This Visit's Progress     Patient Stated   .  "I want to keep my blood sugar under control" (pt-stated)        CARE PLAN ENTRY (see longitudinal plan of care for additional care plan information)  Current Barriers:  . Chronic Disease Management support and education needs related to diabetes  in a patient with HTN and CKD . Wheelchair bound bilateral AKA  Nurse Case Manager Clinical Goal(s):  [name] Over the next 90 days, patient will keep all medical appointments . Over the next 90 days, patient will demonstrate continued self-management of diabetes as evidenced by an A1C within provider recommended range . Over the next 90 days, patient will take all medications as prescribed  Interventions:  . Inter-disciplinary care team collaboration (see longitudinal plan of care) . Evaluation of current treatment plan related to diabetes and patient's adherence to plan as established by provider. . Reviewed medications. Patient is taking as instructed.  . Chart reviewed including relevant office notes and lab results . Discussed most recent A1C results . Discussed diet . Discussed family and social support o Has an aide that comes in 3 days a week o Talks with sisters often . Discussed plans with patient for ongoing care management follow up and provided patient with direct contact information for care management team . Reviewed scheduled/upcoming provider appointments including:  o 09/22/20 with Dr Livia Snellen o 07/08/20 with endocrinologist o 06/16/20 with LCSW . Advised patient, providing education and rationale, to check cbg twice daily and record, calling [phone] for findings outside established parameters.    Patient Self Care Activities:  . Self administers medications as prescribed . Attends all scheduled provider appointments . Calls provider office for new concerns or questions . Unable to independently drive  Please see past  updates related to this goal by clicking on the "Past Updates" button in the selected goal        Other   .  "I don't want to fall"        Current Barriers:  [name] Knowledge Deficits related to fall precautions . Decreased adherence to prescribed treatment for fall prevention  Nurse Case Manager Clinical Goal(s):  [name] Over the next 30 days, patient will demonstrate improved adherence to prescribed treatment plan for decreasing falls as evidenced by patient reporting and review of EMR . Over the next 30days, patient will verbalize using fall risk reduction strategies discussed . Over the next 90 days, patient will not experience additional falls  Interventions:  . Provided verbal education re: Potential causes of falls and Fall prevention strategies . Assessed for falls since last encounter . Discussed use of wheelchair . Discussed ability to perform ADLs o patient lives in a handicap accessible apartment . Discussed that he has an aid that comes 3 days a week . Discussed family and social support . Assessed patients knowledge of fall risk prevention secondary to previously provided education. . Previously confirmed that patient has life alert system  Patient Self Care Activities:  . Utilize wheelchair (assistive device) appropriately with all ambulation . De-clutter walkways . Change positions slowly . Wear secure fitting shoes at all times with ambulation . Utilize home lighting for dim lit areas . Have self and pet awareness at all times  Please see past updates related to this goal by clicking on the "Past Updates" button in the selected goal  The patient verbalized understanding of instructions, educational materials, and care plan provided today and declined offer to receive copy of patient instructions, educational materials, and care plan.   Follow-up Plan:  Telephone follow up appointment with care management team member scheduled for: RN Care Manager 07/08/19, LCSW  06/16/20 The patient has been provided with contact information for the care management team and has been advised to call with any health related questions or concerns.  Next PCP appointment scheduled for: 09/22/20 with Dr Livia Snellen Keep appt with endocrinologist on 07/08/20   Chong Sicilian, BSN, RN-BC [place] [facility] [place] [facility] Direct Dial: [phone]

## 2020-05-28 DIAGNOSIS — Z89611 Acquired absence of right leg above knee: Secondary | ICD-10-CM | POA: Diagnosis not present

## 2020-05-28 DIAGNOSIS — Z89612 Acquired absence of left leg above knee: Secondary | ICD-10-CM | POA: Diagnosis not present

## 2020-05-31 ENCOUNTER — Other Ambulatory Visit: Payer: Self-pay | Admitting: Family Medicine

## 2020-06-01 ENCOUNTER — Telehealth: Payer: Self-pay

## 2020-06-01 DIAGNOSIS — I10 Essential (primary) hypertension: Secondary | ICD-10-CM

## 2020-06-01 DIAGNOSIS — Z794 Long term (current) use of insulin: Secondary | ICD-10-CM

## 2020-06-01 DIAGNOSIS — E1122 Type 2 diabetes mellitus with diabetic chronic kidney disease: Secondary | ICD-10-CM

## 2020-06-01 DIAGNOSIS — Z89611 Acquired absence of right leg above knee: Secondary | ICD-10-CM

## 2020-06-01 DIAGNOSIS — N184 Chronic kidney disease, stage 4 (severe): Secondary | ICD-10-CM

## 2020-06-01 DIAGNOSIS — Z23 Encounter for immunization: Secondary | ICD-10-CM

## 2020-06-01 NOTE — Telephone Encounter (Signed)
Pts daughter called to see if provider or CCM team can put in an order for someone to come out to pts home to administer covid vaccine and flu shot for pt.  Please advise and call [name] [phone]

## 2020-06-01 NOTE — Telephone Encounter (Signed)
06/01/2020  I checked with [facility] because I know they have administered some vaccinations to their homebound clients, but he uses [facility]. [facility] wouldn't be able to administer in the home and they're booked out until the 2nd week in January for in-store vaccines for Covid. He isn't receiving home health services, so that's not an option either. Unfortunately I don't know of any other resources right now. I have placed a referral to our Care Guide team to see if they know of any options in [facility]. They will reach out to the patient/family directly but it may take up to 10 days.   Forwarding back to [facility] clinical team.  Chong Sicilian, BSN, RN-BC [place] [facility] [place] [facility] Direct Dial: [phone]

## 2020-06-01 NOTE — Telephone Encounter (Signed)
[email or web address]

## 2020-06-04 NOTE — Telephone Encounter (Signed)
Informed pt of Kristen's recommendations. He knows to listen for a phone call for other recommendations.

## 2020-06-06 DIAGNOSIS — Z89612 Acquired absence of left leg above knee: Secondary | ICD-10-CM | POA: Diagnosis not present

## 2020-06-06 DIAGNOSIS — Z89611 Acquired absence of right leg above knee: Secondary | ICD-10-CM | POA: Diagnosis not present

## 2020-06-08 ENCOUNTER — Other Ambulatory Visit: Payer: Self-pay | Admitting: "Endocrinology

## 2020-06-16 ENCOUNTER — Ambulatory Visit: Payer: Medicare HMO | Admitting: Licensed Clinical Social Worker

## 2020-06-16 DIAGNOSIS — I10 Essential (primary) hypertension: Secondary | ICD-10-CM

## 2020-06-16 DIAGNOSIS — E782 Mixed hyperlipidemia: Secondary | ICD-10-CM

## 2020-06-16 DIAGNOSIS — E1122 Type 2 diabetes mellitus with diabetic chronic kidney disease: Secondary | ICD-10-CM

## 2020-06-16 DIAGNOSIS — Z794 Long term (current) use of insulin: Secondary | ICD-10-CM

## 2020-06-16 NOTE — Chronic Care Management (AMB) (Signed)
Chronic Care Management    Clinical Social Work Follow Up Note  06/16/2020 Name: Todd [name] MRN: [id number] DOB: December 12, 1955  Todd [name] is a 65 y.o. year old male who is a primary care patient of [name], [name], MD. The CCM team was consulted for assistance with [name] .   Review of patient status, including review of consultants reports, other relevant assessments, and collaboration with appropriate care team members and the patient's provider was performed as part of comprehensive patient evaluation and provision of chronic care management services.    SDOH (Social Determinants of Health) assessments performed: No; risk for social isolation; risk for tobacco use; risk for depression; risk for stress; risk for physical inactivity  Flowsheet Row Chronic Care Management from 07/03/2019 in [place]  PHQ-9 Total Score 6     GAD 7 : Generalized Anxiety Score 07/03/2019  Nervous, Anxious, on Edge 1  Control/stop worrying 0  Worry too much - different things 1  Trouble relaxing 0  Restless 1  Easily annoyed or irritable 1  Afraid - awful might happen 1  Total GAD 7 Score 5  Anxiety Difficulty Somewhat difficult    Outpatient Encounter Medications as of 06/16/2020  Medication Sig  . aspirin EC 81 MG tablet Take 81 mg by mouth daily.  [name] atorvastatin (LIPITOR) 40 MG tablet TAKE 1 TABLET BY MOUTH ONCE DAILY AT 6 PM  . blood glucose meter kit and supplies Dispense based on patient and insurance preference. Use up to four times daily as directed. (FOR ICD-10 E10.9, E11.9).  . cetirizine (ZYRTEC) 10 MG tablet Take 1 tablet (10 mg total) by mouth daily. For allergy symptoms  . folic acid (FOLVITE) 1 MG tablet Take 1 tablet (1 mg total) by mouth daily.  [name] glucose blood (ACCU-CHEK AVIVA PLUS) test strip Check blood sugars four times a day  . Hydroactive Dressings (TEGADERM HYDROCOLLOID THIN) MISC Apply 1 each topically 2 (two) times a week.  . insulin  glargine (LANTUS SOLOSTAR) 100 UNIT/ML Solostar Pen Inject 40 Units into the skin at bedtime.  . Insulin Pen Needle 31G X 5 MM MISC 1 Device by Does not apply route as directed.  [name] levothyroxine (SYNTHROID) 50 MCG tablet Take 1 tablet (50 mcg total) by mouth daily before breakfast.  . megestrol (MEGACE) 400 MG/10ML suspension Take 10 mLs (400 mg total) by mouth 2 (two) times daily. For appetite stimulation  . metoprolol succinate (TOPROL-XL) 100 MG 24 hr tablet TAKE 1 TABLET BY MOUTH ONCE DAILY WITH MEALS OR  IMMEDIATELY  FOLLOWING  . sertraline (ZOLOFT) 50 MG tablet Take 1 tablet (50 mg total) by mouth at bedtime. For anxiety and depression  . Vitamin D, Ergocalciferol, (DRISDOL) 1.25 MG (50000 UNIT) CAPS capsule Take 1 capsule by mouth once a week   No facility-administered encounter medications on file as of 06/16/2020.    Goals    .  Client wants to talk with LCSW in next 30 days to discuss grief issues of clinet related to recent death of his mother (his mother died on 06/05/19) (pt-stated)      Current Barriers:  [name] Mobility challenges in client with Chronic Diagnoses of HTN, Type 2 DM, CKD, HLD  Clinical Social Work Clinical Goal(s):  [name] LCSW will talk with client in next 30 days to discuss client management of grief issues faced  Interventions:  Talked with client about pain issues of client Talked with client about home health aide helping client in the past (  client said that his home health aide is no longer helping him; he was employing her private pay to help him) Talked with client about food provision of client Talked with client about Meals on Wheels support Talked with client about support from sister, [name] Talked with client about client completion of ADLs Talked with client about upcoming client medical appointments Talked with client about breathing of client (client said he is breatihing well) Talked with client about vision of client Talked with client about family  support (clinet said family has been helping him obtain grocery items Talked with client about medication procurement  Talked with client about sleeping issues of client Talked with client about relaxation techniques (watch TV) Talked with client about his request for COVID vaccine (RN Chong Sicilian had been trying to help client with in home administration of COVID 19 shot) Collaborated with [place] regarding client request for in home administration of COVID 19 vaccine shot  Patient Self Care Activities:  Takes medications as prescribed   Self Care Deficit:    Mobility challenges Alcohol use issue     Initial goal documentation        Follow Up Plan: LCSW to call client or sister of client in next 4 weeks to talk about client grief issues faced  Tabitha Tupper.Masiah Woody MSW, LCSW Licensed Clinical Social Worker [facility] Care Management [phone]

## 2020-06-16 NOTE — Patient Instructions (Addendum)
Licensed Clinical Social Worker Visit Information  Goals we discussed today:  .  Client wants to talk with LCSW in next 30 days to discuss grief issues of clinet related to recent death of his mother (his mother died on June 26, 2019) (pt-stated)         Current Barriers:   Mobility challenges in client with Chronic Diagnoses of HTN, Type 2 DM, CKD, HLD  Clinical Social Work Clinical Goal(s):   LCSW will talk with client in next 30 days to discuss client management of grief issues faced  Interventions:  Talked with client about pain issues of client Talked with client about home health aide helping client in the past (client said that his home health aide is no longer helping him; he was employing her private pay to help him) Talked with client about food provision of client Talked with client about Meals on Wheels support Talked with client about support from sister, [name] Talked with client about client completion of ADLs Talked with client about upcoming client medical appointments Talked with client about breathing of client (client said he is breatihing well) Talked with client about vision of client Talked with client about family support (clinet said family has been helping him obtain grocery items Talked with client about medication procurement  Talked with client about sleeping issues of client Talked with client about relaxation techniques (watch TV) Talked with client about his request for COVID vaccine (RN Chong Sicilian had been trying to help client with in home administration of COVID 19 shot) Collaborated with [place] regarding client request for in home administration of COVID 19 vaccine shot  Patient Self Care Activities: Takes medications as prescribed  Self Care Deficit:  Mobility challenges Alcohol use issue   Initial goal documentation      Follow Up Plan: LCSW to call client or sisterof clientin next 4 weeks to talk about  client grief issues faced  Materials Provided: No  The patient verbalized understanding of instructions provided today and declined a print copy of patient instruction materials.   [name].Cataleyah Colborn MSW, LCSW Licensed Clinical Social Worker [facility] Care Management [phone]

## 2020-06-17 ENCOUNTER — Telehealth: Payer: Self-pay | Admitting: Family Medicine

## 2020-06-17 NOTE — Telephone Encounter (Signed)
Referral Reason: Assistance with Covid Vaccine and Flu Vaccine  Interventions: Received message from Jalene Mullet from [facility]. She stated to send a message to Luwam Debru at [facility] to set up Covid -19 hombound appointment for Todd [name]. Sent e-mail to Ms. Debru. Also called both Todd [name] and [name] to let them know. In order to receive the Flu vaccine Mr. Sprung will need to go to the office. Paitent cannot receive homebound Flu vaccine due to the type of [employer] he has.   Follow up plan: No further follow up planned at this time. The patient has been provided with needed resources.

## 2020-06-21 ENCOUNTER — Other Ambulatory Visit: Payer: Self-pay | Admitting: "Endocrinology

## 2020-06-28 DIAGNOSIS — Z89611 Acquired absence of right leg above knee: Secondary | ICD-10-CM | POA: Diagnosis not present

## 2020-06-28 DIAGNOSIS — Z89612 Acquired absence of left leg above knee: Secondary | ICD-10-CM | POA: Diagnosis not present

## 2020-07-02 ENCOUNTER — Other Ambulatory Visit: Payer: Self-pay | Admitting: "Endocrinology

## 2020-07-04 ENCOUNTER — Other Ambulatory Visit: Payer: Self-pay | Admitting: "Endocrinology

## 2020-07-06 ENCOUNTER — Telehealth: Payer: Self-pay

## 2020-07-06 NOTE — Telephone Encounter (Signed)
I connected by phone with [name] and/or patient's caregiver on 07/06/2020 at 9:56 AM to discuss the potential vaccination through our Homebound vaccination initiative.   Prevaccination Checklist for COVID-19 Vaccines  1.  Are you feeling sick today? no  2.  Have you ever received a dose of a COVID-19 vaccine?  no      If yes, which one? None   How many dose of Covid-19 vaccine have your received and dates ? none   Check all that apply: I live in a long-term care setting. no  I have been diagnosed with a medical condition(s). Please list: double amputee, wheelchair bound (pertinent to homebound status)  I am a first responder. no  I work in a long-term care facility, correctional facility, hospital, restaurant, retail setting, school, or other setting with high exposure to the public. no  4. Do you have a health condition or are you undergoing treatment that makes you moderately or severely immunocompromised? (This would include treatment for cancer or HIV, receipt of organ transplant, immunosuppressive therapy or high-dose corticosteroids, CAR-T-cell therapy, hematopoietic cell transplant [HCT], DiGeorge syndrome or Wiskott-Aldrich syndrome)  no  5. Have you received hematopoietic cell transplant (HCT) or CAR-T-cell therapies since receiving COVID-19 vaccine? no  6.  Have you ever had an allergic reaction: (This would include a severe reaction [ e.g., anaphylaxis] that required treatment with epinephrine or EpiPen or that caused you to go to the hospital.  It would also include an allergic reaction that occurred within 4 hours that caused hives, swelling, or respiratory distress, including wheezing.) A.  A previous dose of COVID-19 vaccine. no  B.  A vaccine or injectable therapy that contains multiple components, one of which is a COVID-19 vaccine component, but it is not known which component elicited the immediate reaction. no  C.  Are you allergic to polyethylene glycol? no  D.  Are you allergic to Polysorbate, which is found in some vaccines, film coated tablets and intravenous steroids?  no   7.  Have you ever had an allergic reaction to another vaccine (other than COVID-19 vaccine) or an injectable medication? (This would include a severe reaction [ e.g., anaphylaxis] that required treatment with epinephrine or EpiPen or that caused you to go to the hospital.  It would also include an allergic reaction that occurred within 4 hours that caused hives, swelling, or respiratory distress, including wheezing.)  no   8.  Have you ever had a severe allergic reaction (e.g., anaphylaxis) to something other than a component of the COVID-19 vaccine, or any vaccine or injectable medication?  This would include food, pet, venom, environmental, or oral medication allergies.  no   Check all that apply to you:  Am a male between ages 43 and 65 years old  no  Women 15 through 66 years of age can receive any FDA-authorized or -approved COVID-19 vaccine. However, they should be informed of the rare but increased risk of thrombosis with thrombocytopenia syndrome (TTS) after receipt of the [employer] Vaccine and the availability of other FDA-authorized and -approved COVID-19 vaccines. People who had TTS after a first dose of Janssen vaccine should not receive a subsequent dose of Janssen product    Am a male between ages 66 and 39 years old  no Males 5 through 66 years of age may receive the correct formulation of Pfizer-BioNTech COVID-19 vaccine. Males 18 and older can receive any FDA-authorized or -approved vaccine. However, people receiving an mRNA COVID-19 vaccine, especially males  52 through 66 years of age and their parents/legal representative (when relevant), should be informed of the risk of developing myocarditis (an inflammation of the heart muscle) or pericarditis (inflammation of the lining around the heart) after receipt of an mRNA vaccine. The risk of developing either  myocarditis or pericarditis after vaccination is low, and lower than the risk of myocarditis associated with SARS-CoV-2 infection in adolescents and adults. Vaccine recipients should be counseled about the need to seek care if symptoms of myocarditis or pericarditis develop after vaccination     Have a history of myocarditis or pericarditis  no Myocarditis or pericarditis after receipt of the first dose of an mRNA COVID-19 vaccine series but before administration of the second dose  Experts advise that people who develop myocarditis or pericarditis after a dose of an mRNA COVID-19 vaccine not receive a subsequent dose of any COVID-19 vaccine, until additional safety data are available.  Administration of a subsequent dose of COVID-19 vaccine before safety data are available can be considered in certain circumstances after the episode of myocarditis or pericarditis has completely resolved. Until additional data are available, some experts recommend a Alphonsa Overall COVID-19 vaccine be considered instead of an mRNA COVID-19 vaccine. Decisions about proceeding with a subsequent dose should include a conversation between the patient, their parent/legal representative (when relevant), and their clinical team, which may include a cardiologist.    Have been treated with monoclonal antibodies or convalescent serum to prevent or treat COVID-19  no Vaccination should be offered to people regardless of history of prior symptomatic or asymptomatic SARS-CoV-2 infection. There is no recommended minimal interval between infection and vaccination.  However, vaccination should be deferred if a patient received monoclonal antibodies or convalescent serum as treatment for COVID-19 or for post-exposure prophylaxis. This is a precautionary measure until additional information becomes available, to avoid interference of the antibody treatment with vaccine-induced immune responses.  Defer COVID-19 vaccination for 30 days when a  passive antibody product was used for post-exposure prophylaxis.  Defer COVID-19 vaccination for 90 days when a passive antibody product was used to treat COVID-19.     Diagnosed with Multisystem Inflammatory Syndrome (MIS-C or MIS-A) after a COVID-19 infection  no It is unknown if people with a history of MIS-C or MIS-A are at risk for a dysregulated immune response to COVID-19 vaccination.  People with a history of MIS-C or MIS-A may choose to be vaccinated. Considerations for vaccination may include:   Clinical recovery from MIS-C or MIS-A, including return to normal cardiac function   Personal risk of severe acute COVID-19 (e.g., age, underlying conditions)   High or substantial community transmission of SARS-CoV-2 and personal increased risk of reinfection.   Timing of any immunomodulatory therapies (general best practice guidelines for immunization can be consulted for more information [email or web address])   It has been 90 days or more since their diagnosis of MIS-C   Onset of MIS-C occurred before any COVID-19 vaccination   A conversation between the patient, their guardian(s), and their clinical team or a specialist may assist with COVID-19 vaccination decisions. Healthcare providers and health departments may also request a consultation from the [facility] at [email or web address].     Have a bleeding disorder  no Take a blood thinner  no As with all vaccines, any COVID-19 vaccine product may be given to these patients, if a physician familiar with the patient's bleeding risk determines that the vaccine can be administered intramuscularly with reasonable safety.  ACIP recommends  the following technique for intramuscular vaccination in patients with bleeding disorders or taking blood thinners: a fine-gauge needle (23-gauge or smaller caliber) should be used  for the vaccination, followed by firm pressure on the site, without rubbing, for at least 2 minutes.  People who regularly take aspirin or anticoagulants as part of their routine medications do not need to stop these medications prior to receipt of any COVID-19 vaccine.    Have a history of heparin-induced thrombocytopenia (HIT)  no Although the etiology of TTS associated with the Alphonsa Overall COVID-19 vaccine is unclear, it appears to be similar to another rare immune-mediated syndrome, heparin-induced thrombocytopenia (HIT). People with a history of an episode of an immune-mediated syndrome characterized by thrombosis and thrombocytopenia, such as HIT, should be offered a currently FDA-approved or FDA-authorized mRNA COVID-19 vaccine if it has been ?90 days since their TTS resolved. After 90 days, patients may be vaccinated with any currently FDA-approved or FDA-authorized COVID-19 vaccine, including Janssen COVID-19 Vaccine. However, people who developed TTS after their initial Alphonsa Overall vaccine should not receive a Janssen booster dose.  Experts believe the following factors do not make people more susceptible to TTS after receipt of the [employer]. People with these conditions can be vaccinated with any FDA-authorized or - approved COVID-19 vaccine, including the [employer] COVID-19 Vaccine:   A prior history of venous thromboembolism   Risk factors for venous thromboembolism (e.g., inherited or acquired thrombophilia including Factor V Leiden; prothrombin gene 20210A mutation; antiphospholipid syndrome; protein C, protein S or antithrombin deficiency   A prior history of other types of thromboses not associated with thrombocytopenia   Pregnancy, post-partum status, or receipt of hormonal contraceptives (e.g., combined oral contraceptives, patch, ring)   Additional recipient education materials can be found at http://gutierrez-robinson.com/ vaccines/safety/JJUpdate.html.    Am currently  pregnant or breastfeeding  no Vaccination is recommended for all people aged 96 years and older, including people that are:   Pregnant   Breastfeeding   Trying to get pregnant now or who might become pregnant in the future   Pregnant, breastfeeding, and post-partum people 36 through 66 years of age should be aware of the rare risk of TTS after receipt of the Alphonsa Overall COVID-19 Vaccine and the availability of other FDA-authorized or -approved COVID-19 vaccines (i.e., mRNA vaccines).    Have received dermal fillers  no FDA-authorized or -approved COVID-19 vaccines can be administered to people who have received injectable dermal fillers who have no contraindications for vaccination.  Infrequently, these people might experience temporary swelling at or near the site of filler injection (usually the face or lips) following administration of a dose of an mRNA COVID-19 vaccine. These people should be advised to contact their healthcare provider if swelling develops at or near the site of dermal filler following vaccination.     Have a history of Guillain-Barr Syndrome (GBS)  no People with a history of GBS can receive any FDA-authorized or -approved COVID-19 vaccine. However, given the possible association between the [employer] and an increased risk of GBS, a patient with a history of GBS and their clinical team should discuss the availability of mRNA vaccines to offer protection against COVID-19. The highest risk has been observed in men aged 8-64 years with symptoms of GBS beginning within 42 days after Alphonsa Overall COVID-19 vaccination.  People who had GBS after receiving Janssen vaccine should be made aware of the option to receive an mRNA COVID-19 vaccine booster at least 2 months (8 weeks) after the [employer]  dose. However, Alphonsa Overall vaccine may be used as a booster, particularly if GBS occurred more than 42 days after vaccination or was related to a non-vaccine factor. Prior to booster  vaccination, a conversation between the patient and their clinical team may assist with decisions about use of a COVID-19 booster dose, including the timing of administration     Postvaccination Observation Times for People without Contraindications to Covid 19 Vaccination.  30 minutes:  People with a history of: A contraindication to another type of COVID-19 vaccine product (i.e., mRNA or viral vector COVID-19 vaccines)   Immediate (within 4 hours of exposure) non-severe allergic reaction to a COVID-19 vaccine or injectable therapies   Anaphylaxis due to any cause   Immediate allergic reaction of any severity to a non-COVID-19 vaccine   15 minutes: All other people  This patient is a 65 y.o. male that meets the FDA criteria to receive homebound vaccination. Patient or parent/caregiver understands they have the option to accept or refuse homebound vaccination.  Patient passed the pre-screening checklist and would like to proceed with homebound vaccination.  Based on questionnaire above, I recommend the patient be observed for 15 minutes.  There are an estimated #0 other household members/caregivers who are also interested in receiving the vaccine.    The patient has been confirmed homebound and eligible for homebound vaccination with the considerations outlined above. I will send the patient's information to our scheduling team who will reach out to schedule the patient and potential caregiver/family members for homebound vaccination.    Dan Humphreys 07/06/2020 9:56 AM

## 2020-07-07 ENCOUNTER — Other Ambulatory Visit: Payer: Self-pay | Admitting: "Endocrinology

## 2020-07-07 ENCOUNTER — Ambulatory Visit: Payer: Medicare HMO | Admitting: *Deleted

## 2020-07-07 DIAGNOSIS — Z89612 Acquired absence of left leg above knee: Secondary | ICD-10-CM | POA: Diagnosis not present

## 2020-07-07 DIAGNOSIS — Z89611 Acquired absence of right leg above knee: Secondary | ICD-10-CM | POA: Diagnosis not present

## 2020-07-07 DIAGNOSIS — I10 Essential (primary) hypertension: Secondary | ICD-10-CM

## 2020-07-07 DIAGNOSIS — E1122 Type 2 diabetes mellitus with diabetic chronic kidney disease: Secondary | ICD-10-CM

## 2020-07-07 DIAGNOSIS — Z794 Long term (current) use of insulin: Secondary | ICD-10-CM

## 2020-07-07 NOTE — Patient Instructions (Signed)
Visit Information  Goals Addressed            This Visit's Progress   . Apply for Medicaid       Timeframe:  Short Term Goal Priority:  High Start Date:                             Expected End Date:                       Follow Up Date 08/12/20   . Patient/sister will talk with member of CCM team about applying for Medicaid    . Monitor and Manage My Blood Sugar-Diabetes Type 2       Timeframe:  Long-Range Goal Priority:  Medium Start Date:                             Expected End Date:                       Follow Up Date 08/12/20    . check blood sugar at prescribed times . check blood sugar if I feel it is too high or too low . enter blood sugar readings and medication or insulin into daily log . take the blood sugar log to all doctor visits . take the blood sugar meter to all doctor visits  . Patient should call PCP if he has any blood sugar readings below 70 or above 200   Why is this important?    Checking your blood sugar at home helps to keep it from getting very high or very low.   Writing the results in a diary or log helps the doctor know how to care for you.   Your blood sugar log should have the time, date and the results.   Also, write down the amount of insulin or other medicine that you take.   Other information, like what you ate, exercise done and how you were feeling, will also be helpful.     Notes:     . Track and Manage My Blood Pressure-Hypertension       Timeframe:  Long-Range Goal Priority:  Medium Start Date:                             Expected End Date:                       Follow Up Date 08/12/20   . check blood pressure 3 times per week . write blood pressure results in a log or diary . Take blood pressure log to PCP appointments . Call PCP with any readings outside of recommended range   Why is this important?    You won't feel high blood pressure, but it can still hurt your blood vessels.   High blood pressure can cause heart  or kidney problems. It can also cause a stroke.   Making lifestyle changes like losing a little weight or eating less salt will help.   Checking your blood pressure at home and at different times of the day can help to control blood pressure.   If the doctor prescribes medicine remember to take it the way the doctor ordered.   Call the office if you cannot afford the medicine or if there are questions  about it.     Notes:        Patient verbalizes understanding of instructions provided today and agrees to view in [place].    Follow Up Plan: . Telephone follow up appointment with care management team member scheduled for: RN Care Manager on 08/12/20 and LCSW on 07/18/19 . The patient has been provided with contact information for the care management team and has been advised to call with any health related questions or concerns.  . Next PCP appointment scheduled for: 09/22/20 with Dr Livia Snellen  Chong Sicilian, BSN, RN-BC [place] [facility] [place] [facility] Direct Dial: [phone]

## 2020-07-07 NOTE — Chronic Care Management (AMB) (Signed)
Chronic Care Management   CCM RN Visit Note  07/07/2020 Name: Todd [name] MRN: [id number] DOB: July 29, 1955  Subjective: Todd [name] is a 65 y.o. year old male who is a primary care patient of [name], [name], MD. The care management team was consulted for assistance with disease management and care coordination needs.    Engaged with patient by telephone for follow up visit in response to provider referral for case management and/or care coordination services.   Consent to Services:  The patient was given information about Chronic Care Management services, agreed to services, and gave verbal consent prior to initiation of services.  Please see initial visit note for detailed documentation.   Patient agreed to services and verbal consent obtained.   Assessment: Review of patient past medical history, allergies, medications, health status, including review of consultants reports, laboratory and other test data, was performed as part of comprehensive evaluation and provision of chronic care management services.   SDOH (Social Determinants of Health) assessments and interventions performed:    CCM Care Plan  Allergies  Allergen Reactions  . Lisinopril Other (See Comments)    Hyperkalemia / Renal failure    Outpatient Encounter Medications as of 07/07/2020  Medication Sig  . atorvastatin (LIPITOR) 40 MG tablet TAKE 1 TABLET BY MOUTH ONCE DAILY AT 6 PM  . insulin glargine (LANTUS SOLOSTAR) 100 UNIT/ML Solostar Pen Inject 40 Units into the skin at bedtime.  . megestrol (MEGACE) 400 MG/10ML suspension Take 10 mLs (400 mg total) by mouth 2 (two) times daily. For appetite stimulation  . metoprolol succinate (TOPROL-XL) 100 MG 24 hr tablet TAKE 1 TABLET BY MOUTH ONCE DAILY WITH MEALS OR  IMMEDIATELY  FOLLOWING  . aspirin EC 81 MG tablet Take 81 mg by mouth daily.  . blood glucose meter kit and supplies Dispense based on patient and insurance preference. Use up to four times daily as  directed. (FOR ICD-10 E10.9, E11.9).  . cetirizine (ZYRTEC) 10 MG tablet Take 1 tablet (10 mg total) by mouth daily. For allergy symptoms  . folic acid (FOLVITE) 1 MG tablet Take 1 tablet (1 mg total) by mouth daily.  [name] glucose blood (ACCU-CHEK AVIVA PLUS) test strip Check blood sugars four times a day  . Hydroactive Dressings (TEGADERM HYDROCOLLOID THIN) MISC Apply 1 each topically 2 (two) times a week.  . Insulin Pen Needle 31G X 5 MM MISC 1 Device by Does not apply route as directed.  [name] levothyroxine (SYNTHROID) 50 MCG tablet Take 1 tablet (50 mcg total) by mouth daily before breakfast.  . sertraline (ZOLOFT) 50 MG tablet Take 1 tablet (50 mg total) by mouth at bedtime. For anxiety and depression  . Vitamin D, Ergocalciferol, (DRISDOL) 1.25 MG (50000 UNIT) CAPS capsule Take 1 capsule by mouth once a week   No facility-administered encounter medications on file as of 07/07/2020.    Patient Active Problem List   Diagnosis Date Noted  . Vitamin D deficiency 06/17/2019  . Essential hypertension, benign 08/30/2018  . Mixed hyperlipidemia 07/17/2018  . CKD stage 3 due to type 2 diabetes mellitus ([place]) 02/14/2018  . Hyponatremia 02/14/2018  . Medically noncompliant 02/14/2018  . S/P AKA (above knee amputation) bilateral ([place]) 02/12/2018  . Current smoker 02/03/2018  . Leukocytosis 02/03/2018  . Reactive thrombocytosis 02/03/2018  . Urinary incontinence 08/14/2017  . Transaminitis   . Type 2 diabetes mellitus with stage 4 chronic kidney disease, with long-term current use of insulin ([place]) 07/21/2016  . Hyperuricemia 06/21/2016  . HTN (  hypertension) 01/05/2016  . Diabetes type 2, uncontrolled ([place]) 01/05/2016  . Hypothyroidism 05/10/2011    Conditions to be addressed/monitored: HTN, DM, hyperlipidemia, hypothyroidism, CKD, hyperlipidemia, anxiety/depression, bilateral AKA  Care Plan : RNCM: Diabetes Type 2 (Adult)  Updates made by Ilean China, RN since 07/07/2020 12:00 AM     Problem: Glycemic Management (Diabetes, Type 2)   Priority: Medium    Long-Range Goal: Glycemic Management Optimized   This Visit's Progress: On track  Priority: Medium  Note:   Current Barriers:  . Chronic Disease Management support and education needs related to diabetes . Unable to independently drive. Has to have assistance with some ADLs and IADLs.  Nurse Case Manager Clinical Goal(s):  [name] Over the next 60 days, patient will work with Consulting civil engineer to address needs related to self-management of diabetes  Interventions:  . 1:1 collaboration with [name], MD regarding development and update of comprehensive plan of care as evidenced by provider attestation and co-signature . Inter-disciplinary care team collaboration (see longitudinal plan of care) . Evaluation of current treatment plan related to diabetes and patient's adherence to plan as established by provider. . Chart reviewed including relevant office notes and lab results . Discussed recent A1C results with patient o 6.5 . Reviewed and discussed medications o Taking 40 units of lantus at night o Medication is affordable . Discussed home blood sugar testing o Testing 3 times a day o This morning it was 111 o No readings below 70 or above 200 . Discussed diet and appetite o Appetite has improved with megace o Eating 2 "good meals" a day and snacks throughout the day in between o Does not have any problems with access to food o He prepares some of his own meals and has people that bring in food as well . Reviewed upcoming appointments: PCP in 09/2020  . Previously collaborated with embedded Care Guides to arrange Covid vaccine in his home o He is scheduled for 1st Covid vaccine in his home on 07/28/20 . Encouraged patient to bring blood sugar log with him to PCP appointment  Patient Goals/Self-Care Activities Over the next 60 days, patient will: . check blood sugar at prescribed times . check blood sugar if I  feel it is too high or too low . enter blood sugar readings and medication or insulin into daily log . take the blood sugar log to all doctor visits . take the blood sugar meter to all doctor visits  . Patient should call PCP if he has any blood sugar readings below 70 or above 200    Care Plan : RNCM: Hypertension (Adult)  Updates made by Ilean China, RN since 07/07/2020 12:00 AM    Problem: Hypertension (Hypertension)     Long-Range Goal: Hypertension Monitored   This Visit's Progress: Not on track  Priority: Medium  Note:   Current Barriers:  . Chronic Disease Management support and education needs related to hypertension . Does not check blood pressure regularly . Unable to drive  Nurse Case Manager Clinical Goal(s):  [name] Over the next 60 days, patient will work with Consulting civil engineer to address needs related to self-management of hypertension . Over the next 60 days, patient will demonstrate improved adherence to prescribed treatment plan for hypertension as evidenced by checking and recording blood pressure at least 3 times a week  Interventions:  . 1:1 collaboration with [name], MD regarding development and update of comprehensive plan of care as evidenced by provider attestation  and co-signature . Inter-disciplinary care team collaboration (see longitudinal plan of care) . Evaluation of current treatment plan related to hypertension and patient's adherence to plan as established by provider. . Chart reviewed including relevant office notes and lab results . Reviewed and discussed medications: Metoprolol 172m daily o Patient is compliant with medication and does not have any problems with affordability . Recommended that he take and record his blood pressure at least 3 times per week . Provided rationale for checking blood pressure regularly . Encouraged patient to take blood pressure log to PCP appointments for review . Reviewed upcoming appointments: PCP  09/22/20 . [facility]  Patient Goals/Self-Care Activities Over the next 60 days, patient will: .  check blood pressure 3 times per week . write blood pressure results in a log or diary . Take blood pressure log to PCP appointments . Call PCP with any readings outside of recommended range    Care Plan : RNCM: Community Help  Updates made by HIlean China RN since 07/07/2020 12:00 AM    Problem: Apply for Medicaid   Priority: High  Note:   Current Barriers:  . Care Coordination needs related to Medicaid application in a patient with diabetes, HLD, HTN, CKD . Unable to independently drive or collect resource information on his own  Nurse Case Manager Clinical Goal(s):  .Marland KitchenOver the next 30 days, patient will work with CCM Team to address needs related to Medicaid application  Interventions:  . 1:1 collaboration with SClaretta Fraise MD regarding development and update of comprehensive plan of care as evidenced by provider attestation and co-signature . Inter-disciplinary care team collaboration (see longitudinal plan of care) . [facility] Patient has had Medicaid but it was d/c after he was eligible for Medicare . Discussed Medicare Extra Help and patient confirmed that he does have this . Discussed applying for Medicaid and patient is interested . RN will discuss medicaid application with LCSW and Care Guides and have them reach out to patient, or sister TKenney Houseman to assist with completion . [facility]  Patient Goals/Self-Care Activities Over the next 30 days, patient will: . Talk with member of CCM team regarding Medicaid application       Follow Up Plan: . Telephone follow up appointment with care management team member scheduled for: RN Care Manager on 08/12/20 and LCSW on 07/18/19 . The patient has been provided with contact information for the  care management team and has been advised to call with any health related questions or concerns.  . Next PCP appointment scheduled for: 09/22/20 with Dr SLivia Snellen KChong Sicilian BSN, RN-BC [place] Direct Dial: [phone]

## 2020-07-08 ENCOUNTER — Ambulatory Visit: Payer: Medicare HMO | Admitting: Nurse Practitioner

## 2020-07-08 NOTE — Patient Instructions (Incomplete)
Diabetes Mellitus and Nutrition, Adult When you have diabetes, or diabetes mellitus, it is very important to have healthy eating habits because your blood sugar (glucose) levels are greatly affected by what you eat and drink. Eating healthy foods in the right amounts, at about the same times every day, can help you:  Control your blood glucose.  Lower your risk of heart disease.  Improve your blood pressure.  Reach or maintain a healthy weight. What can affect my meal plan? Every person with diabetes is different, and each person has different needs for a meal plan. Your health care provider may recommend that you work with a dietitian to make a meal plan that is best for you. Your meal plan may vary depending on factors such as:  The calories you need.  The medicines you take.  Your weight.  Your blood glucose, blood pressure, and cholesterol levels.  Your activity level.  Other health conditions you have, such as heart or kidney disease. How do carbohydrates affect me? Carbohydrates, also called carbs, affect your blood glucose level more than any other type of food. Eating carbs naturally raises the amount of glucose in your blood. Carb counting is a method for keeping track of how many carbs you eat. Counting carbs is important to keep your blood glucose at a healthy level, especially if you use insulin or take certain oral diabetes medicines. It is important to know how many carbs you can safely have in each meal. This is different for every person. Your dietitian can help you calculate how many carbs you should have at each meal and for each snack. How does alcohol affect me? Alcohol can cause a sudden decrease in blood glucose (hypoglycemia), especially if you use insulin or take certain oral diabetes medicines. Hypoglycemia can be a life-threatening condition. Symptoms of hypoglycemia, such as sleepiness, dizziness, and confusion, are similar to symptoms of having too much  alcohol.  Do not drink alcohol if: ? Your health care provider tells you not to drink. ? You are pregnant, may be pregnant, or are planning to become pregnant.  If you drink alcohol: ? Do not drink on an empty stomach. ? Limit how much you use to:  0-1 drink a day for women.  0-2 drinks a day for men. ? Be aware of how much alcohol is in your drink. In the [place] one drink equals one 12 oz bottle of beer (355 mL), one 5 oz glass of wine (148 mL), or one 1 oz glass of hard liquor (44 mL). ? Keep yourself hydrated with water, diet soda, or unsweetened iced tea.  Keep in mind that regular soda, juice, and other mixers may contain a lot of sugar and must be counted as carbs. What are tips for following this plan? Reading food labels  Start by checking the serving size on the "Nutrition Facts" label of packaged foods and drinks. The amount of calories, carbs, fats, and other nutrients listed on the label is based on one serving of the item. Many items contain more than one serving per package.  Check the total grams (g) of carbs in one serving. You can calculate the number of servings of carbs in one serving by dividing the total carbs by 15. For example, if a food has 30 g of total carbs per serving, it would be equal to 2 servings of carbs.  Check the number of grams (g) of saturated fats and trans fats in one serving. Choose foods that have   a low amount or none of these fats.  Check the number of milligrams (mg) of salt (sodium) in one serving. Most people should limit total sodium intake to less than 2,300 mg per day.  Always check the nutrition information of foods labeled as "low-fat" or "nonfat." These foods may be higher in added sugar or refined carbs and should be avoided.  Talk to your dietitian to identify your daily goals for nutrients listed on the label. Shopping  Avoid buying canned, pre-made, or processed foods. These foods tend to be high in fat, sodium, and added  sugar.  Shop around the outside edge of the grocery store. This is where you will most often find fresh fruits and vegetables, bulk grains, fresh meats, and fresh dairy. Cooking  Use low-heat cooking methods, such as baking, instead of high-heat cooking methods like deep frying.  Cook using healthy oils, such as olive, canola, or sunflower oil.  Avoid cooking with butter, cream, or high-fat meats. Meal planning  Eat meals and snacks regularly, preferably at the same times every day. Avoid going long periods of time without eating.  Eat foods that are high in fiber, such as fresh fruits, vegetables, beans, and whole grains. Talk with your dietitian about how many servings of carbs you can eat at each meal.  Eat 4-6 oz (112-168 g) of lean protein each day, such as lean meat, chicken, fish, eggs, or tofu. One ounce (oz) of lean protein is equal to: ? 1 oz (28 g) of meat, chicken, or fish. ? 1 egg. ?  cup (62 g) of tofu.  Eat some foods each day that contain healthy fats, such as avocado, nuts, seeds, and fish.   What foods should I eat? Fruits Berries. Apples. Oranges. Peaches. Apricots. Plums. Grapes. Mango. Papaya. Pomegranate. Kiwi. Cherries. Vegetables Lettuce. Spinach. Leafy greens, including kale, chard, collard greens, and mustard greens. Beets. Cauliflower. Cabbage. Broccoli. Carrots. Green beans. Tomatoes. Peppers. Onions. Cucumbers. Brussels sprouts. Grains Whole grains, such as whole-wheat or whole-grain bread, crackers, tortillas, cereal, and pasta. Unsweetened oatmeal. Quinoa. Brown or wild rice. Meats and other proteins Seafood. Poultry without skin. Lean cuts of poultry and beef. Tofu. Nuts. Seeds. Dairy Low-fat or fat-free dairy products such as milk, yogurt, and cheese. The items listed above may not be a complete list of foods and beverages you can eat. Contact a dietitian for more information. What foods should I avoid? Fruits Fruits canned with  syrup. Vegetables Canned vegetables. Frozen vegetables with butter or cream sauce. Grains Refined white flour and flour products such as bread, pasta, snack foods, and cereals. Avoid all processed foods. Meats and other proteins Fatty cuts of meat. Poultry with skin. Breaded or fried meats. Processed meat. Avoid saturated fats. Dairy Full-fat yogurt, cheese, or milk. Beverages Sweetened drinks, such as soda or iced tea. The items listed above may not be a complete list of foods and beverages you should avoid. Contact a dietitian for more information. Questions to ask a health care provider  Do I need to meet with a diabetes educator?  Do I need to meet with a dietitian?  What number can I call if I have questions?  When are the best times to check my blood glucose? Where to find more information:  American Diabetes Association: diabetes.org  Academy of Nutrition and Dietetics: www.eatright.org  National Institute of Diabetes and Digestive and Kidney Diseases: www.niddk.nih.gov  Association of Diabetes Care and Education Specialists: www.diabeteseducator.org Summary  It is important to have healthy eating   habits because your blood sugar (glucose) levels are greatly affected by what you eat and drink.  A healthy meal plan will help you control your blood glucose and maintain a healthy lifestyle.  Your health care provider may recommend that you work with a dietitian to make a meal plan that is best for you.  Keep in mind that carbohydrates (carbs) and alcohol have immediate effects on your blood glucose levels. It is important to count carbs and to use alcohol carefully. This information is not intended to replace advice given to you by your health care provider. Make sure you discuss any questions you have with your health care provider. Document Revised: 05/07/2019 Document Reviewed: 05/07/2019 Elsevier Patient Education  2021 Elsevier Inc.  

## 2020-07-17 ENCOUNTER — Telehealth: Payer: Medicare HMO

## 2020-07-27 ENCOUNTER — Telehealth: Payer: Self-pay

## 2020-07-27 ENCOUNTER — Telehealth: Payer: Self-pay | Admitting: "Endocrinology

## 2020-07-27 DIAGNOSIS — E559 Vitamin D deficiency, unspecified: Secondary | ICD-10-CM

## 2020-07-27 MED ORDER — SERTRALINE HCL 50 MG PO TABS: 50.0000 mg | ORAL_TABLET | Freq: Every day | ORAL | 0 refills | Status: DC

## 2020-07-27 MED ORDER — MEGESTROL ACETATE 400 MG/10ML PO SUSP: 400.0000 mg | Freq: Two times a day (BID) | ORAL | 2 refills | Status: DC

## 2020-07-27 MED ORDER — ATORVASTATIN CALCIUM 40 MG PO TABS: ORAL_TABLET | ORAL | 0 refills | Status: DC

## 2020-07-27 MED ORDER — METOPROLOL SUCCINATE ER 100 MG PO TB24: ORAL_TABLET | ORAL | 0 refills | Status: DC

## 2020-07-27 MED ORDER — CETIRIZINE HCL 10 MG PO TABS: 10.0000 mg | ORAL_TABLET | Freq: Every day | ORAL | 1 refills | Status: DC

## 2020-07-27 NOTE — Telephone Encounter (Signed)
Discussed with Todd [name] that pt needs to take vitamin D3 5000 units daily, not the vitamin D2 50,000 units weekly per Dr.Nida's last note. Scheduled pt for a follow up appointment on March 3rd at 10:30a.m. Also advised her pt would need to have lab work performed prior to his visit. Understanding voiced.

## 2020-07-27 NOTE — Telephone Encounter (Signed)
Left a message requesting a return call to the office. 

## 2020-07-27 NOTE — Telephone Encounter (Signed)
Aware refills sent to pharmacy 

## 2020-07-27 NOTE — Telephone Encounter (Signed)
Pt sister [name] called and is calling in reference to patient Vit D medication being denied. She is requesting a call back [phone]

## 2020-07-28 ENCOUNTER — Ambulatory Visit: Payer: Medicare HMO | Attending: Critical Care Medicine

## 2020-07-28 DIAGNOSIS — Z23 Encounter for immunization: Secondary | ICD-10-CM

## 2020-07-28 NOTE — Progress Notes (Signed)
   Covid-19 Vaccination Clinic  Name:  Terreon Ekholm    MRN: [id number] DOB: 1955/07/18  07/28/2020  Mr. Maland was observed post Covid-19 immunization for 15 MIN without incident. He was provided with Vaccine Information Sheet and instruction to access the V-Safe system.   Mr. Glassburn was instructed to call 911 with any severe reactions post vaccine: [name] Difficulty breathing  . Swelling of face and throat  . A fast heartbeat  . A bad rash all over body  . Dizziness and weakness   Immunizations Administered    Name Date Dose VIS Date Route   PFIZER Comrnaty(Gray TOP) Covid-19 Vaccine 07/28/2020 10:12 AM 0.3 mL 05/21/2020 Intramuscular   Manufacturer: [employer], [employer]   Lot: [id number]   NDC: [id number]

## 2020-08-04 NOTE — Telephone Encounter (Signed)
Could you release his lab for his Vitamin D?

## 2020-08-05 NOTE — Addendum Note (Signed)
Addended by: Ellin Saba on: 08/05/2020 08:21 AM   Modules accepted: Orders

## 2020-08-05 NOTE — Telephone Encounter (Signed)
Order for Vitamin D released.

## 2020-08-06 ENCOUNTER — Other Ambulatory Visit: Payer: Medicare HMO

## 2020-08-06 ENCOUNTER — Other Ambulatory Visit: Payer: Self-pay

## 2020-08-06 DIAGNOSIS — E1122 Type 2 diabetes mellitus with diabetic chronic kidney disease: Secondary | ICD-10-CM | POA: Diagnosis not present

## 2020-08-06 DIAGNOSIS — E559 Vitamin D deficiency, unspecified: Secondary | ICD-10-CM | POA: Diagnosis not present

## 2020-08-06 DIAGNOSIS — N184 Chronic kidney disease, stage 4 (severe): Secondary | ICD-10-CM | POA: Diagnosis not present

## 2020-08-06 DIAGNOSIS — E039 Hypothyroidism, unspecified: Secondary | ICD-10-CM | POA: Diagnosis not present

## 2020-08-06 DIAGNOSIS — Z794 Long term (current) use of insulin: Secondary | ICD-10-CM | POA: Diagnosis not present

## 2020-08-07 LAB — VITAMIN D 25 HYDROXY (VIT D DEFICIENCY, FRACTURES): Vit D, 25-Hydroxy: 52.3 ng/mL (ref 30.0–100.0)

## 2020-08-07 LAB — MICROALBUMIN / CREATININE URINE RATIO
Creatinine, Urine: 73.2 mg/dL
Microalb/Creat Ratio: 503 mg/g creat — ABNORMAL HIGH (ref 0–29)
Microalbumin, Urine: 368.5 ug/mL

## 2020-08-07 LAB — COMPREHENSIVE METABOLIC PANEL
ALT: 13 IU/L (ref 0–44)
AST: 30 IU/L (ref 0–40)
Albumin/Globulin Ratio: 0.6 — ABNORMAL LOW (ref 1.2–2.2)
Albumin: 2.9 g/dL — ABNORMAL LOW (ref 3.8–4.8)
Alkaline Phosphatase: 272 IU/L — ABNORMAL HIGH (ref 44–121)
BUN/Creatinine Ratio: 9 — ABNORMAL LOW (ref 10–24)
BUN: 9 mg/dL (ref 8–27)
Bilirubin Total: 0.4 mg/dL (ref 0.0–1.2)
CO2: 18 mmol/L — ABNORMAL LOW (ref 20–29)
Calcium: 9.1 mg/dL (ref 8.6–10.2)
Chloride: 101 mmol/L (ref 96–106)
Creatinine, Ser: 1.01 mg/dL (ref 0.76–1.27)
GFR calc Af Amer: 90 mL/min/{1.73_m2} (ref >59)
GFR calc non Af Amer: 78 mL/min/{1.73_m2} (ref >59)
Globulin, Total: 4.9 g/dL — ABNORMAL HIGH (ref 1.5–4.5)
Glucose: 154 mg/dL — ABNORMAL HIGH (ref 65–99)
Potassium: 4.3 mmol/L (ref 3.5–5.2)
Sodium: 135 mmol/L (ref 134–144)
Total Protein: 7.8 g/dL (ref 6.0–8.5)

## 2020-08-07 LAB — T4, FREE: Free T4: 0.94 ng/dL (ref 0.82–1.77)

## 2020-08-07 LAB — HEMOGLOBIN A1C
Est. average glucose Bld gHb Est-mCnc: 126 mg/dL
Hgb A1c MFr Bld: 6 % — ABNORMAL HIGH (ref 4.8–5.6)

## 2020-08-07 LAB — TSH: TSH: 44.1 u[IU]/mL — ABNORMAL HIGH (ref 0.450–4.500)

## 2020-08-12 ENCOUNTER — Telehealth: Payer: Medicare HMO | Admitting: *Deleted

## 2020-08-13 ENCOUNTER — Ambulatory Visit: Payer: Medicare HMO | Admitting: Nurse Practitioner

## 2020-08-13 ENCOUNTER — Other Ambulatory Visit: Payer: Self-pay

## 2020-08-13 ENCOUNTER — Encounter: Payer: Self-pay | Admitting: Nurse Practitioner

## 2020-08-13 ENCOUNTER — Ambulatory Visit (INDEPENDENT_AMBULATORY_CARE_PROVIDER_SITE_OTHER): Payer: Medicare HMO | Admitting: Nurse Practitioner

## 2020-08-13 VITALS — BP 158/84 | HR 61

## 2020-08-13 DIAGNOSIS — E1121 Type 2 diabetes mellitus with diabetic nephropathy: Secondary | ICD-10-CM | POA: Diagnosis not present

## 2020-08-13 DIAGNOSIS — E559 Vitamin D deficiency, unspecified: Secondary | ICD-10-CM

## 2020-08-13 DIAGNOSIS — F17210 Nicotine dependence, cigarettes, uncomplicated: Secondary | ICD-10-CM

## 2020-08-13 DIAGNOSIS — E782 Mixed hyperlipidemia: Secondary | ICD-10-CM

## 2020-08-13 DIAGNOSIS — Z794 Long term (current) use of insulin: Secondary | ICD-10-CM

## 2020-08-13 DIAGNOSIS — E039 Hypothyroidism, unspecified: Secondary | ICD-10-CM

## 2020-08-13 DIAGNOSIS — I10 Essential (primary) hypertension: Secondary | ICD-10-CM

## 2020-08-13 MED ORDER — LANTUS SOLOSTAR 100 UNIT/ML ~~LOC~~ SOPN: 30.0000 [IU] | PEN_INJECTOR | Freq: Every day | SUBCUTANEOUS | 5 refills | Status: DC

## 2020-08-13 NOTE — Progress Notes (Signed)
08/13/2020, 10:10 AM        Endocrinology follow-up note       Subjective:    Patient ID: Todd [name], male    DOB: 02/11/1956.  Todd [name] is here to follow-up for management of currently uncontrolled symptomatic diabetes requested by  [name], MD.   Past Medical History:  Diagnosis Date  . AKI (acute kidney injury) ([place])   . Constipated   . Diabetes mellitus without complication ([place])   . Diarrhea   . Elevated LFTs   . Goiter   . Gout   . Hyperlipidemia   . Hypertension   . Leukocytosis   . Reactive thrombocytosis   . Right BKA infection ([place]) 08/2016  . Right leg pain   . Sepsis due to undetermined organism ([place])   . Thyroid disease   . Wound infection after surgery 08/2016    Past Surgical History:  Procedure Laterality Date  . ABDOMINAL AORTOGRAM N/A 08/11/2016   Procedure: Abdominal Aortogram;  Surgeon: Waynetta Sandy, MD;  Location: [place] [facility];  Service: Cardiovascular;  Laterality: N/A;  . ABDOMINAL AORTOGRAM W/LOWER EXTREMITY N/A 08/15/2016   Procedure: Abdominal Aortogram w/Lower Extremity;  Surgeon: Elam Dutch, MD;  Location: [place];  Service: Cardiovascular;  Laterality: N/A;  . AMPUTATION Right 08/17/2016   Procedure: RIGHT BELOW KNEE AMPUTATION;  Surgeon: Elam Dutch, MD;  Location: [facility];  Service: Vascular;  Laterality: Right;  . AMPUTATION Right 09/12/2016   Procedure: AMPUTATION ABOVE KNEE;  Surgeon: Newt Minion, MD;  Location: [place];  Service: Orthopedics;  Laterality: Right;  . AMPUTATION Left 08/12/2016   Procedure: LEFT BELOW KNEE AMPUTATION;  Surgeon: Newt Minion, MD;  Location: [place];  Service: Orthopedics;  Laterality: Left;  . AMPUTATION Left 11/01/2017   Procedure: LEFT ABOVE KNEE AMPUTATION;  Surgeon: Newt Minion, MD;  Location: [place];  Service: Orthopedics;  Laterality: Left;  . APPLICATION OF WOUND VAC Right 09/12/2016    Procedure: APPLICATION OF WOUND VAC ABOVE KNEE;  Surgeon: Newt Minion, MD;  Location: [facility];  Service: Orthopedics;  Laterality: Right;  . LOWER EXTREMITY ANGIOGRAPHY Bilateral 08/11/2016   Procedure: Lower Extremity Angiography;  Surgeon: Waynetta Sandy, MD;  Location: [facility] [facility];  Service: Cardiovascular;  Laterality: Bilateral;  . PERIPHERAL VASCULAR BALLOON ANGIOPLASTY Left 08/11/2016   Procedure: Peripheral Vascular Balloon Angioplasty;  Surgeon: Waynetta Sandy, MD;  Location: [place] [facility];  Service: Cardiovascular;  Laterality: Left;  SFA  . THYROID SURGERY      Social History   Socioeconomic History  . Marital status: Single    Spouse name: Not on file  . Number of children: Not on file  . Years of education: Not on file  . Highest education level: Not on file  Occupational History  . Occupation: drives a Forensic scientist  Tobacco Use  . Smoking status: Current Every Day Smoker    Packs/day: 0.25    Years: 45.00    Pack years: 11.25    Types: Cigarettes    Last attempt to quit: 11/12/2014    Years since quitting: 5.7  . Smokeless tobacco: Never Used  Vaping Use  . Vaping Use: Never used  Substance and Sexual Activity  . Alcohol use: Not Currently    Alcohol/week: 0.0 standard drinks  . Drug use: No  . Sexual activity: Not Currently  Other Topics Concern  . Not on file  Social History Narrative  . Not on file   Social Determinants of Health   Financial Resource Strain: Not on file  Food Insecurity: Not on file  Transportation Needs: Not on file  Physical Activity: Not on file  Stress: Not on file  Social Connections: Not on file    Family History  Problem Relation Age of Onset  . Heart disease Mother   . Pneumonia Father   . Diabetes Maternal Aunt   . Diabetes Maternal Uncle     Outpatient Encounter Medications as of 08/13/2020  Medication Sig  . aspirin EC 81 MG tablet Take 81 mg by mouth daily.  [name] atorvastatin (LIPITOR) 40 MG  tablet TAKE 1 TABLET BY MOUTH ONCE DAILY AT 6 PM  . blood glucose meter kit and supplies Dispense based on patient and insurance preference. Use up to four times daily as directed. (FOR ICD-10 E10.9, E11.9).  . cetirizine (ZYRTEC) 10 MG tablet Take 1 tablet (10 mg total) by mouth daily. For allergy symptoms  . folic acid (FOLVITE) 1 MG tablet Take 1 tablet (1 mg total) by mouth daily.  [name] glucose blood (ACCU-CHEK AVIVA PLUS) test strip Check blood sugars four times a day  . Hydroactive Dressings (TEGADERM HYDROCOLLOID THIN) MISC Apply 1 each topically 2 (two) times a week.  . insulin glargine (LANTUS SOLOSTAR) 100 UNIT/ML Solostar Pen Inject 30 Units into the skin at bedtime.  . Insulin Pen Needle 31G X 5 MM MISC 1 Device by Does not apply route as directed.  [name] levothyroxine (SYNTHROID) 50 MCG tablet Take 1 tablet (50 mcg total) by mouth daily before breakfast.  . megestrol (MEGACE) 400 MG/10ML suspension Take 10 mLs (400 mg total) by mouth 2 (two) times daily. For appetite stimulation  . metoprolol succinate (TOPROL-XL) 100 MG 24 hr tablet TAKE 1 TABLET BY MOUTH ONCE DAILY WITH MEALS OR  IMMEDIATELY  FOLLOWING  . sertraline (ZOLOFT) 50 MG tablet Take 1 tablet (50 mg total) by mouth at bedtime. For anxiety and depression  . Vitamin D, Ergocalciferol, (DRISDOL) 1.25 MG (50000 UNIT) CAPS capsule Take 1 capsule by mouth once a week  . [DISCONTINUED] insulin glargine (LANTUS SOLOSTAR) 100 UNIT/ML Solostar Pen Inject 40 Units into the skin at bedtime.   No facility-administered encounter medications on file as of 08/13/2020.    ALLERGIES: Allergies  Allergen Reactions  . Lisinopril Other (See Comments)    Hyperkalemia / Renal failure    VACCINATION STATUS: Immunization History  Administered Date(s) Administered  . Influenza,inj,Quad PF,6+ Mos 05/20/2015, 04/07/2017, 03/09/2018, 05/01/2019  . Influenza-Unspecified 05/10/2011  . PFIZER Comirnaty(Gray Top)Covid-19 Tri-Sucrose Vaccine 07/28/2020   . PPD Test 09/16/2016    Diabetes He presents for his follow-up diabetic visit. He has type 2 diabetes mellitus. Onset time: Patient was diagnosed with A1c of greater than 14% in 2017 at the age of 62 years. His disease course has been stable. Pertinent negatives for hypoglycemia include no confusion, headaches, nervousness/anxiousness, pallor, seizures or tremors. Pertinent negatives for diabetes include no chest pain, no fatigue, no polydipsia, no polyphagia, no polyuria and no weakness. There are no hypoglycemic complications. Symptoms are stable. Diabetic complications include nephropathy and PVD. Risk factors for coronary artery disease include dyslipidemia, diabetes mellitus, male sex, sedentary lifestyle, tobacco exposure and hypertension. Current  diabetic treatment includes insulin injections. He is compliant with treatment most of the time. His weight is decreasing steadily. He is following a generally unhealthy diet. When asked about meal planning, he reported none. He has not had a previous visit with a dietitian. He never participates in exercise. His home blood glucose trend is decreasing steadily. His breakfast blood glucose range is generally 110-130 mg/dl. His overall blood glucose range is 110-130 mg/dl. (He presents today with no meter or logs to review.  His previsit A1c was 6%, improving from previous visit of 6.5%.  He reports morning glucose is usually around 115 or so and his evening glucose is usually around 121 or so.  He denies any hypoglycemia.) An ACE inhibitor/angiotensin II receptor blocker is not being taken. He does not see a podiatrist.Eye exam is current.  Hyperlipidemia This is a chronic problem. The current episode started more than 1 year ago. The problem is controlled. Recent lipid tests were reviewed and are normal. Exacerbating diseases include chronic renal disease, diabetes and hypothyroidism. Factors aggravating his hyperlipidemia include beta blockers. Pertinent  negatives include no chest pain, myalgias or shortness of breath. Current antihyperlipidemic treatment includes statins. The current treatment provides mild improvement of lipids. Compliance problems include adherence to diet and adherence to exercise.  Risk factors for coronary artery disease include diabetes mellitus, dyslipidemia, a sedentary lifestyle, male sex and hypertension.  Thyroid Problem Presents for follow-up visit. Onset time: He reports thyroidectomy in 2013 due to benign nodular goiter. Patient reports no anxiety, cold intolerance, constipation, depressed mood, diarrhea, fatigue, heat intolerance, palpitations or tremors. The symptoms have been stable. The treatment provided moderate relief. Prior procedures include thyroidectomy. His past medical history is significant for diabetes and hyperlipidemia.   Review of systems  Constitutional: + Minimally fluctuating body weight,  current There is no height or weight on file to calculate BMI. , no fatigue, no subjective hyperthermia, no subjective hypothermia Eyes: no blurry vision, no xerophthalmia ENT: no sore throat, no nodules palpated in throat, no dysphagia/odynophagia, no hoarseness Cardiovascular: no chest pain, no shortness of breath, no palpitations, no leg swelling Respiratory: no cough, no shortness of breath Gastrointestinal: no nausea/vomiting/diarrhea Musculoskeletal: no muscle/joint aches, hx of bilateral AKA Skin: no rashes, no hyperemia Neurological: no tremors, no numbness, no tingling, no dizziness Psychiatric: no depression, no anxiety  Objective:    BP (!) 158/84   Pulse 61   SpO2 100%   Wt Readings from Last 3 Encounters:  02/15/18 134 lb 14.7 oz (61.2 kg)  02/05/18 138 lb 14.2 oz (63 kg)  11/30/17 145 lb (65.8 kg)    BP Readings from Last 3 Encounters:  08/13/20 (!) 158/84  03/23/20 131/82  02/14/20 115/68    Review of systems  Constitutional: + Minimally fluctuating body weight,  current There is  no height or weight on file to calculate BMI. , no fatigue, no subjective hyperthermia, no subjective hypothermia Eyes: no blurry vision, no xerophthalmia ENT: no sore throat, no nodules palpated in throat, no dysphagia/odynophagia, no hoarseness Cardiovascular: no chest pain, no shortness of breath, no palpitations, no leg swelling Respiratory: no cough, no shortness of breath Gastrointestinal: no nausea/vomiting/diarrhea Musculoskeletal: no muscle/joint aches, hx of bilateral AKA Skin: no rashes, no hyperemia, nicotinic discoloration to fingernails bilaterally Neurological: no tremors, no numbness, no tingling, no dizziness Psychiatric: no depression, no anxiety  Diabetic Labs (most recent): Lab Results  Component Value Date   HGBA1C 6.0 (H) 08/06/2020   HGBA1C 6.5 01/27/2020   HGBA1C  6.8 09/30/2019     Lipid Panel ( most recent) Lipid Panel     Component Value Date/Time   CHOL 149 01/27/2020 0859   TRIG 150 (H) 01/27/2020 0859   HDL 31 (L) 01/27/2020 0859   CHOLHDL 4.8 01/27/2020 0859   LDLCALC 91 01/27/2020 0859     Recent Results (from the past 2160 hour(s))  TSH     Status: Abnormal   Collection Time: 08/06/20  8:05 AM  Result Value Ref Range   TSH 44.100 (H) 0.450 - 4.500 uIU/mL  T4, free     Status: None   Collection Time: 08/06/20  8:05 AM  Result Value Ref Range   Free T4 0.94 0.82 - 1.77 ng/dL  Comprehensive metabolic panel     Status: Abnormal   Collection Time: 08/06/20  8:05 AM  Result Value Ref Range   Glucose 154 (H) 65 - 99 mg/dL   BUN 9 8 - 27 mg/dL   Creatinine, Ser 1.01 0.76 - 1.27 mg/dL    Comment:                **Effective August 10, 2020 [facility] will begin**                  reporting the 2021 CKD-EPI creatinine equation that                  estimates kidney function without a race variable.    GFR calc non Af Amer 78 >59 mL/min/1.73   GFR calc Af Amer 90 >59 mL/min/1.73    Comment: **In accordance with recommendations from the NKF-ASN  Task force,**   [facility] is in the process of updating its eGFR calculation to the   2021 CKD-EPI creatinine equation that estimates kidney function   without a race variable.    BUN/Creatinine Ratio 9 (L) 10 - 24   Sodium 135 134 - 144 mmol/L   Potassium 4.3 3.5 - 5.2 mmol/L   Chloride 101 96 - 106 mmol/L   CO2 18 (L) 20 - 29 mmol/L   Calcium 9.1 8.6 - 10.2 mg/dL   Total Protein 7.8 6.0 - 8.5 g/dL   Albumin 2.9 (L) 3.8 - 4.8 g/dL   Globulin, Total 4.9 (H) 1.5 - 4.5 g/dL   Albumin/Globulin Ratio 0.6 (L) 1.2 - 2.2   Bilirubin Total 0.4 0.0 - 1.2 mg/dL   Alkaline Phosphatase 272 (H) 44 - 121 IU/L   AST 30 0 - 40 IU/L   ALT 13 0 - 44 IU/L  Microalbumin / creatinine urine ratio     Status: Abnormal   Collection Time: 08/06/20  8:05 AM  Result Value Ref Range   Creatinine, Urine 73.2 Not Estab. mg/dL   Microalbumin, Urine 368.5 Not Estab. ug/mL   Microalb/Creat Ratio 503 (H) 0 - 29 mg/g creat    Comment:                        Normal:                0 -  29                        Moderately increased: 30 - 300                        Severely increased:       >300   Hemoglobin A1c  Status: Abnormal   Collection Time: 08/06/20  8:05 AM  Result Value Ref Range   Hgb A1c MFr Bld 6.0 (H) 4.8 - 5.6 %    Comment:          Prediabetes: 5.7 - 6.4          Diabetes: >6.4          Glycemic control for adults with diabetes: <7.0    Est. average glucose Bld gHb Est-mCnc 126 mg/dL  Vitamin D, 25-hydroxy     Status: None   Collection Time: 08/06/20  8:05 AM  Result Value Ref Range   Vit D, 25-Hydroxy 52.3 30.0 - 100.0 ng/mL    Comment: Vitamin D deficiency has been defined by the [place] and an Endocrine Society practice guideline as a level of serum 25-OH vitamin D less than 20 ng/mL (1,2). The Endocrine Society went on to further define vitamin D insufficiency as a level between 21 and 29 ng/mL (2). 1. IOM (Institute of Medicine). 2010. Dietary reference    intakes for  calcium and D. [place]: The    [employer]. 2. Holick MF, Binkley [place], Bischoff-Ferrari HA, et al.    Evaluation, treatment, and prevention of vitamin D    deficiency: an Endocrine Society clinical practice    guideline. JCEM. 2011 Jul; 96(7):1911-30.        Assessment & Plan:   1. Type 2 diabetes mellitus with stage 4 chronic kidney disease, with long-term current use of insulin (HCC)  - Todd [name] has currently uncontrolled symptomatic type 2 DM since 65 years of age.  He presents for his virtual visit with his logs showing at target fasting and postprandial glycemic profile.  His previsit A1C was 6.5% on 01/27/20, improving from 6.8% at last visit.  He denies any episodes of hypoglycemia.  -This patient has history of heavy alcohol use/abuse which might have contributed to the pathogenesis of his diabetes. -I had a long discussion with him about the progressive nature of type 2 diabetes and the pathology behind its complications.   -his diabetes is complicated by peripheral arterial disease status post bilateral above-knee amputation -sedentary life, renal insufficiency, and he remains at extremely high risk for more acute and chronic complications which include CAD, CVA, CKD, retinopathy, and neuropathy. These are all discussed in detail with him.  - Nutritional counseling repeated at each appointment due to patients tendency to fall back in to old habits.  - The patient admits there is a room for improvement in their diet and drink choices. -  Suggestion is made for the patient to avoid simple carbohydrates from their diet including Cakes, Sweet Desserts / Pastries, Ice Cream, Soda (diet and regular), Sweet Tea, Candies, Chips, Cookies, Sweet Pastries,  Store Bought Juices, Alcohol in Excess of  1-2 drinks a day, Artificial Sweeteners, Coffee Creamer, and "Sugar-free" Products. This will help patient to have stable blood glucose profile and potentially avoid  unintended weight gain.   - I encouraged the patient to switch to  unprocessed or minimally processed complex starch and increased protein intake (animal or plant source), fruits, and vegetables.   - Patient is advised to stick to a routine mealtimes to eat 3 meals  a day and avoid unnecessary snacks ( to snack only to correct hypoglycemia).  - I have approached him with the following individualized plan to manage diabetes and patient agrees:   -#1 priority in his care will be to avoid hypoglycemia.  -Based  on his tight A1c of 6%, I worry he may be having more low glucose readings than he is reporting.  He will tolerate reduction of his Lantus to 30 units SQ daily at bedtime.    -He is encouraged to continue monitoring blood glucose twice daily, before meals and at bedtime and call the clinic if readings are less than 70 or greater than 200 for 3 tests in a row.  -In this patient with high likelihood of pancreatic diabetes, tight glycemic profile is not advised as he may be lacking endogenous glucagon response which puts him at high risk for hypoglycemia.  - he is not a candidate for Metformin, SGLT2 inhibitors due to concurrent renal insufficiency. -He is not a candidate for incretin therapy due to his background history of alcoholism with possible risk of pancreatitis.  - Patient specific target  A1c;  LDL, HDL, Triglycerides, and  Waist Circumference were discussed in detail.  2) Blood Pressure /Hypertension: -His blood pressure is not controlled to target. He is advised to continue Metoprolol 100 mg po daily.  May need additional agent at next visit if BP remains elevated.  3) Lipids/Hyperlipidemia: His most recent lipid panel from 01/27/20 shows borderline LDL of 91 and improving triglycerides of 150.  He is advised to continue Lipitor 40 mg po daily at bedtime.  Side effects and precautions discussed with him.  He is advised to avoid fried foods and butter.  4)  Weight/Diet: -   His  There is no height or weight on file to calculate BMI.  CDE Consult has been initiated . Exercise, and detailed carbohydrates information provided  -  detailed on discharge instructions.  He cannot exercise optimally, wheelchair-bound due to bilateral above-knee amputations.  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 His previsit thyroid function tests are consistent with under-replacement.  He reports he takes his thyroid medication with all of his other medications which may have contributed to this finding.  He is advised to continue current dose of Levothyroxine 50 mcg po daily before breakfast and to take it properly.   - We discussed about the correct intake of his thyroid hormone, on empty stomach at fasting, with water, separated by at least 30 minutes from breakfast and other medications,  and separated by more than 4 hours from calcium, iron, multivitamins, acid reflux medications (PPIs). -Patient is made aware of the fact that thyroid hormone replacement is needed for life, dose to be adjusted by periodic monitoring of thyroid function tests.  6) Vitamin D deficiency: -Last vitamin D level on 08/06/20 was 52.3.  He is advised to continue vitamin D3 5000 units daily as maintenance dose until next measurement.  7) Chronic Care/Health Maintenance: -he is on statin medications and is encouraged to initiate and continue to follow up with Ophthalmology, Dentist,  Podiatrist at least yearly or according to recommendations, and advised to stay away from smoking. I have recommended yearly flu vaccine and pneumonia vaccine at least every 5 years;  and  sleep for at least 7 hours a day. -He cannot exercise optimally.  Patient does not have adequate social support, he has transportation concerns.  He may benefit from home assessment by Education officer, museum.  The patient was counseled on the dangers of tobacco use, and was advised  to quit.  Reviewed strategies to maximize success, including removing cigarettes and smoking materials from environment.   - he is  advised to maintain close  follow up with [name], MD for primary care needs, as well as his other providers for optimal and coordinated care.    - Time spent on this patient care encounter:  40 min, of which > 50% was spent in  counseling and the rest reviewing his blood glucose logs , discussing his hypoglycemia and hyperglycemia episodes, reviewing his current and  previous labs / studies  ( including abstraction from other facilities) and medications  doses and developing a  long term treatment plan and documenting his care.   Please refer to Patient Instructions for Blood Glucose Monitoring and Insulin/Medications Dosing Guide"  in media tab for additional information. Please  also refer to " Patient Self Inventory" in the Media  tab for reviewed elements of pertinent patient history.  Todd [name] participated in the discussions, expressed understanding, and voiced agreement with the above plans.  All questions were answered to his satisfaction. he is encouraged to contact clinic should he have any questions or concerns prior to his return visit.   Follow up plan: - Return in about 6 months (around 02/13/2021) for Diabetes follow up with A1c in office, Thyroid follow up, Previsit labs, Bring glucometer and logs.  Rayetta Pigg, [facility], [facility] [address] [place], [place] [address] Phone: [phone] Fax: [phone]  08/13/2020, 10:10 AM

## 2020-08-13 NOTE — Patient Instructions (Signed)

## 2020-08-18 ENCOUNTER — Ambulatory Visit: Payer: Medicare HMO | Attending: Critical Care Medicine

## 2020-08-18 DIAGNOSIS — Z23 Encounter for immunization: Secondary | ICD-10-CM

## 2020-08-18 NOTE — Progress Notes (Signed)
   Covid-19 Vaccination Clinic  Name:  Todd [name]    MRN: [id number] DOB: 05-19-1956  08/18/2020  [name] was observed post Covid-19 immunization for 15 min without incident. He was provided with Vaccine Information Sheet and instruction to access the V-Safe system.   [name] was instructed to call 911 with any severe reactions post vaccine: [name] Difficulty breathing  . Swelling of face and throat  . A fast heartbeat  . A bad rash all over body  . Dizziness and weakness   Immunizations Administered    Name Date Dose VIS Date Route   PFIZER Comrnaty(Gray TOP) Covid-19 Vaccine 08/18/2020 10:50 AM 0.3 mL 05/21/2020 Intramuscular   Manufacturer: [place]   Lot: [id number]   [place]: [place] Comrnaty(Gray TOP) Covid-19 Vaccine 08/18/2020 10:50 AM 0.3 mL 05/21/2020 Intramuscular   Manufacturer: [employer], [employer]   Lot: [id number]   [place]: [phone]

## 2020-08-20 ENCOUNTER — Telehealth: Payer: Medicare HMO

## 2020-09-07 ENCOUNTER — Telehealth: Payer: Medicare HMO

## 2020-09-08 ENCOUNTER — Other Ambulatory Visit: Payer: Self-pay | Admitting: Family Medicine

## 2020-09-14 ENCOUNTER — Telehealth: Payer: Self-pay | Admitting: *Deleted

## 2020-09-14 ENCOUNTER — Telehealth: Payer: Medicare HMO | Admitting: *Deleted

## 2020-09-14 NOTE — Telephone Encounter (Signed)
  Chronic Care Management   Outreach Note  09/14/2020 Name: Todd [name] MRN: [id number] DOB: 08-25-1955  Referred by: [name], MD Reason for referral : Chronic Care Management (Unsuccessful telephone follow-up)   A second unsuccessful telephone outreach was attempted today. The patient was referred to the case management team for assistance with care management and care coordination.   Follow Up Plan: Forwarding to Care Guides to reschedule telephone follow-up  Chong Sicilian, BSN, RN-BC [facility] Management Direct Dial: [phone]

## 2020-09-15 NOTE — Telephone Encounter (Signed)
Rescheduled 09/28/2020

## 2020-09-15 NOTE — Telephone Encounter (Signed)
Patient scheduled 09/28/2020

## 2020-09-15 NOTE — Telephone Encounter (Signed)
Please reschedule RN CM at [facility]  

## 2020-09-21 ENCOUNTER — Ambulatory Visit (INDEPENDENT_AMBULATORY_CARE_PROVIDER_SITE_OTHER): Payer: Medicare HMO | Admitting: Licensed Clinical Social Worker

## 2020-09-21 DIAGNOSIS — E039 Hypothyroidism, unspecified: Secondary | ICD-10-CM

## 2020-09-21 DIAGNOSIS — E782 Mixed hyperlipidemia: Secondary | ICD-10-CM

## 2020-09-21 DIAGNOSIS — E1122 Type 2 diabetes mellitus with diabetic chronic kidney disease: Secondary | ICD-10-CM

## 2020-09-21 DIAGNOSIS — Z89611 Acquired absence of right leg above knee: Secondary | ICD-10-CM

## 2020-09-21 DIAGNOSIS — N183 Chronic kidney disease, stage 3 unspecified: Secondary | ICD-10-CM

## 2020-09-21 DIAGNOSIS — I10 Essential (primary) hypertension: Secondary | ICD-10-CM

## 2020-09-21 DIAGNOSIS — Z794 Long term (current) use of insulin: Secondary | ICD-10-CM

## 2020-09-21 DIAGNOSIS — N184 Chronic kidney disease, stage 4 (severe): Secondary | ICD-10-CM

## 2020-09-21 NOTE — Patient Instructions (Signed)
Visit Information  PATIENT GOALS: Goals Addressed            This Visit's Progress   . manage grief issues faced       Timeframe:  Short-Term Goal Priority:  Medium Progress: On Track Start Date:        09/21/20                     Expected End Date:      12/21/20                 Follow Up Date 10/27/20   Manage My Emotions (Patient) Manage Grief issues faced    Why is this important?    When you are stressed, down or upset, your body reacts too.   For example, your blood pressure may get higher; you may have a headache or stomachache.   When your emotions get the best of you, your body's ability to fight off cold and flu gets weak.   These steps will help you manage your emotions.     Patient Self Care Activities:  . Self administers medications as prescribed . Attends all scheduled provider appointments  Patient Coping Strengths:  . Family . Friends  Patient Self Care Deficits:  [name] Mobility issues  Patient Goals:  - spend time or talk with others at least 2 to 3 times per week - practice relaxation or meditation daily - keep a calendar with appointment dates  Follow Up Plan: LCSW to call client on 10/27/20       Silus Lanzo.Jamilee Lafosse MSW, LCSW Licensed Clinical Social Worker [facility] Care Management [phone]

## 2020-09-21 NOTE — Chronic Care Management (AMB) (Signed)
Chronic Care Management    Clinical Social Work Note  09/21/2020 Name: Todd [name] MRN: [id number] DOB: 03/17/1956  Todd [name] is a 65 y.o. year old male who is a primary care patient of [name], Todd Gash, MD. The CCM team was consulted to assist the patient with chronic disease management and/or care coordination needs related to: [name] .   Engaged with patient Todd [name], sister of patient, by telephone for follow up visit in response to provider referral for social work chronic care management and care coordination services.   Consent to Services:  The patient was given information about Chronic Care Management services, agreed to services, and gave verbal consent prior to initiation of services.  Please see initial visit note for detailed documentation.   Patient agreed to services and consent obtained.   Assessment: Review of patient past medical history, allergies, medications, and health status, including review of relevant consultants reports was performed today as part of a comprehensive evaluation and provision of chronic care management and care coordination services.     SDOH (Social Determinants of Health) assessments and interventions performed:  SDOH Interventions   Flowsheet Row Most Recent Value  SDOH Interventions   Depression Interventions/Treatment  Medication       Advanced Directives Status: See Vynca application for related entries.  CCM Care Plan  Allergies  Allergen Reactions  . Lisinopril Other (See Comments)    Hyperkalemia / Renal failure    Outpatient Encounter Medications as of 09/21/2020  Medication Sig  . aspirin EC 81 MG tablet Take 81 mg by mouth daily.  [name] atorvastatin (LIPITOR) 40 MG tablet TAKE 1 TABLET BY MOUTH ONCE DAILY AT 6 PM  . blood glucose meter kit and supplies Dispense based on patient and insurance preference. Use up to four times daily as directed. (FOR ICD-10 E10.9, E11.9).  . cetirizine (ZYRTEC) 10 MG  tablet Take 1 tablet (10 mg total) by mouth daily. For allergy symptoms  . folic acid (FOLVITE) 1 MG tablet Take 1 tablet (1 mg total) by mouth daily.  [name] glucose blood (ACCU-CHEK AVIVA PLUS) test strip Check blood sugars four times a day  . Hydroactive Dressings (TEGADERM HYDROCOLLOID THIN) MISC Apply 1 each topically 2 (two) times a week.  . insulin glargine (LANTUS SOLOSTAR) 100 UNIT/ML Solostar Pen Inject 30 Units into the skin at bedtime.  . Insulin Pen Needle 31G X 5 MM MISC 1 Device by Does not apply route as directed.  [name] levothyroxine (SYNTHROID) 50 MCG tablet Take 1 tablet (50 mcg total) by mouth daily before breakfast.  . megestrol (MEGACE) 400 MG/10ML suspension Take 10 mLs (400 mg total) by mouth 2 (two) times daily. For appetite stimulation  . metoprolol succinate (TOPROL-XL) 100 MG 24 hr tablet TAKE 1 TABLET BY MOUTH ONCE DAILY WITH MEALS OR  IMMEDIATELY  FOLLOWING  . sertraline (ZOLOFT) 50 MG tablet TAKE 1 TABLET BY MOUTH AT BEDTIME FOR ANXIETY AND FOR DEPRESSION  . Vitamin D, Ergocalciferol, (DRISDOL) 1.25 MG (50000 UNIT) CAPS capsule Take 1 capsule by mouth once a week   No facility-administered encounter medications on file as of 09/21/2020.    Patient Active Problem List   Diagnosis Date Noted  . Vitamin D deficiency 06/17/2019  . Essential hypertension, benign 08/30/2018  . Mixed hyperlipidemia 07/17/2018  . CKD stage 3 due to type 2 diabetes mellitus ([place]) 02/14/2018  . Hyponatremia 02/14/2018  . Medically noncompliant 02/14/2018  . S/P AKA (above knee amputation) bilateral ([place]) 02/12/2018  . Current  smoker 02/03/2018  . Leukocytosis 02/03/2018  . Reactive thrombocytosis 02/03/2018  . Urinary incontinence 08/14/2017  . Transaminitis   . Type 2 diabetes mellitus with stage 4 chronic kidney disease, with long-term current use of insulin ([place]) 07/21/2016  . Hyperuricemia 06/21/2016  . HTN (hypertension) 01/05/2016  . Diabetes type 2, uncontrolled ([place]) 01/05/2016  .  Hypothyroidism 05/10/2011    Conditions to be addressed/monitored: ; Monitor client's management of grief issues faced  Care Plan : LCSW Care plan  Updates made by Katha Cabal, LCSW since 09/21/2020 12:00 AM    Problem: Emotional Distress     Goal: manage grief issues faced   Start Date: 09/21/2020  Expected End Date: 12/21/2020  This Visit's Progress: On track  Priority: Medium  Note:   Current Barriers:  [name] Mobility issues . Pain issues . Needs help with ADLs . Suicidal Ideation/Homicidal Ideation: No  Clinical Social Work Goal(s):  . patient will work with SW monthly by telephone or in person to reduce or manage symptoms related to grief issues faced . patient will work with SW monthly to address concerns related to mobility issues of client  Interventions: . 1:1 collaboration with [name], MD regarding development and update of comprehensive plan of care as evidenced by provider attestation and co-signature . Talked with Todd [name], sister of client , about client completion of ADLs . Talked with Todd [name] about ADLs completion of client . Talked with Todd [name] about appetite of client . Talked with Todd [name] about socialization of client . Talked with Todd [name]  about client use of wheelchair . Talked with Todd [name] about pain issues of client . Talked with Todd [name] about medication procurement of client  . Encouraged Todd [name] or client to call RNCM as needed for nursing support . Talked with Todd [name] about grief issues faced by client . Talked with client about family support . Talked with Todd [name] about client relaxation techniques (likes to watch favorite TV shows) . Talked with Todd [name] about food procurement of client (talked with Todd [name] about [place] [facility])  Patient Self Care Activities:  . Self administers medications as prescribed . Attends all scheduled provider appointments  Patient Coping Strengths:  . Family . Friends  Patient Self Care Deficits:  [name] Mobility  issues  Patient Goals:  - spend time or talk with others at least 2 to 3 times per week - practice relaxation or meditation daily - keep a calendar with appointment dates  Follow Up Plan: LCSW to call client on 10/27/20     Keaghan Staton.Crystin Lechtenberg MSW, LCSW Licensed Clinical Social Worker [facility] Care Management [phone]

## 2020-09-22 ENCOUNTER — Other Ambulatory Visit: Payer: Self-pay

## 2020-09-22 ENCOUNTER — Ambulatory Visit (INDEPENDENT_AMBULATORY_CARE_PROVIDER_SITE_OTHER): Payer: Medicare HMO | Admitting: Family Medicine

## 2020-09-22 ENCOUNTER — Encounter: Payer: Self-pay | Admitting: Family Medicine

## 2020-09-22 VITALS — BP 106/62 | HR 68 | Temp 97.3°F

## 2020-09-22 DIAGNOSIS — E790 Hyperuricemia without signs of inflammatory arthritis and tophaceous disease: Secondary | ICD-10-CM | POA: Diagnosis not present

## 2020-09-22 DIAGNOSIS — E1122 Type 2 diabetes mellitus with diabetic chronic kidney disease: Secondary | ICD-10-CM | POA: Diagnosis not present

## 2020-09-22 DIAGNOSIS — I10 Essential (primary) hypertension: Secondary | ICD-10-CM

## 2020-09-22 DIAGNOSIS — N184 Chronic kidney disease, stage 4 (severe): Secondary | ICD-10-CM

## 2020-09-22 DIAGNOSIS — E782 Mixed hyperlipidemia: Secondary | ICD-10-CM

## 2020-09-22 DIAGNOSIS — Z794 Long term (current) use of insulin: Secondary | ICD-10-CM | POA: Diagnosis not present

## 2020-09-22 NOTE — Progress Notes (Signed)
Subjective:  Patient ID: Todd [name], male    DOB: [birth year]  Age: 65 y.o. MRN: [id number]  CC: Medical Management of Chronic Issues   HPI [name] presents for  follow-up of hypertension. Patient has no history of headache chest pain or shortness of breath or recent cough. Patient also denies symptoms of TIA such as focal numbness or weakness. Patient denies side effects from medication. States taking it regularly. Has not been able to get the motorized wheelchair.Dedicated visit done 01/21/20. Wheelchair bound due to double AKA.  Diabetes care done by Dr. Liliane Channel office. They cut back on his insulin recently.   in for follow-up of elevated cholesterol. Doing well without complaints on current medication. Denies side effects of statin including myalgia and arthralgia and nausea. Currently no chest pain, shortness of breath or other cardiovascular related symptoms noted.  Recent elevated TSH, but normal Free T4. Due for recheck. Says he feels fine.   History [name] has a past medical history of AKI (acute kidney injury) ([place]), Constipated, Diabetes mellitus without complication ([place]), Diarrhea, Elevated LFTs, Goiter, Gout, Hyperlipidemia, Hypertension, Leukocytosis, Reactive thrombocytosis, Right BKA infection ([place]) (08/2016), Right leg pain, Sepsis due to undetermined organism [facility]), Thyroid disease, and Wound infection after surgery (08/2016).   He has a past surgical history that includes Thyroid surgery; Lower Extremity Angiography (Bilateral, 08/11/2016); ABDOMINAL AORTOGRAM (N/A, 08/11/2016); PERIPHERAL VASCULAR BALLOON ANGIOPLASTY (Left, 08/11/2016); ABDOMINAL AORTOGRAM W/LOWER EXTREMITY (N/A, 08/15/2016); Amputation (Right, 08/17/2016); Amputation (Right, 09/12/2016); Application if wound vac (Right, 09/12/2016); Amputation (Left, 08/12/2016); and Amputation (Left, 11/01/2017).   His family history includes Diabetes in his maternal aunt and maternal uncle; Heart disease in his mother;  Pneumonia in his father.He reports that he has been smoking cigarettes. He has a 11.25 pack-year smoking history. He has never used smokeless tobacco. He reports previous alcohol use. He reports that he does not use drugs.  Current Outpatient Medications on File Prior to Visit  Medication Sig Dispense Refill  . aspirin EC 81 MG tablet Take 81 mg by mouth daily.    [name] atorvastatin (LIPITOR) 40 MG tablet TAKE 1 TABLET BY MOUTH ONCE DAILY AT 6 PM 90 tablet 0  . blood glucose meter kit and supplies Dispense based on patient and insurance preference. Use up to four times daily as directed. (FOR ICD-10 E10.9, E11.9). 1 each 0  . cetirizine (ZYRTEC) 10 MG tablet Take 1 tablet (10 mg total) by mouth daily. For allergy symptoms 90 tablet 1  . folic acid (FOLVITE) 1 MG tablet Take 1 tablet (1 mg total) by mouth daily.    [name] glucose blood (ACCU-CHEK AVIVA PLUS) test strip Check blood sugars four times a day 400 each 3  . Hydroactive Dressings (TEGADERM HYDROCOLLOID THIN) MISC Apply 1 each topically 2 (two) times a week. 10 each 0  . insulin glargine (LANTUS SOLOSTAR) 100 UNIT/ML Solostar Pen Inject 30 Units into the skin at bedtime. 30 mL 5  . Insulin Pen Needle 31G X 5 MM MISC 1 Device by Does not apply route as directed. 100 each 1  . levothyroxine (SYNTHROID) 50 MCG tablet Take 1 tablet (50 mcg total) by mouth daily before breakfast. 90 tablet 3  . megestrol (MEGACE) 400 MG/10ML suspension Take 10 mLs (400 mg total) by mouth 2 (two) times daily. For appetite stimulation 600 mL 2  . metoprolol succinate (TOPROL-XL) 100 MG 24 hr tablet TAKE 1 TABLET BY MOUTH ONCE DAILY WITH MEALS OR  IMMEDIATELY  FOLLOWING 90 tablet 0  .  sertraline (ZOLOFT) 50 MG tablet TAKE 1 TABLET BY MOUTH AT BEDTIME FOR ANXIETY AND FOR DEPRESSION 90 tablet 0  . Vitamin D, Ergocalciferol, (DRISDOL) 1.25 MG (50000 UNIT) CAPS capsule Take 1 capsule by mouth once a week 12 capsule 0   No current facility-administered medications on file prior  to visit.    ROS Review of Systems  Constitutional: Negative for fever.  Respiratory: Negative for shortness of breath.   Cardiovascular: Negative for chest pain.  Musculoskeletal: Negative for arthralgias.  Skin: Negative for rash.    Objective:  BP 106/62   Pulse 68   Temp (!) 97.3 F (36.3 C)   BP Readings from Last 3 Encounters:  09/22/20 106/62  08/13/20 (!) 158/84  03/23/20 131/82    Wt Readings from Last 3 Encounters:  02/15/18 134 lb 14.7 oz (61.2 kg)  02/05/18 138 lb 14.2 oz (63 kg)  11/30/17 145 lb (65.8 kg)     Physical Exam    Assessment & Plan:   [name] was seen today for medical management of chronic issues.  Diagnoses and all orders for this visit:  Type 2 diabetes mellitus with stage 4 chronic kidney disease, with long-term current use of insulin (HCC) -     TSH + free T4 -     CMP14+EGFR -     Uric acid -     Lipid panel  Primary hypertension -     TSH + free T4 -     CMP14+EGFR -     Lipid panel  Mixed hyperlipidemia -     TSH + free T4 -     CMP14+EGFR -     Lipid panel  Hyperuricemia -     Uric acid   Allergies as of 09/22/2020      Reactions   Lisinopril Other (See Comments)   Hyperkalemia / Renal failure      Medication List       Accurate as of September 22, 2020 12:04 PM. If you have any questions, ask your nurse or doctor.        aspirin EC 81 MG tablet Take 81 mg by mouth daily.   atorvastatin 40 MG tablet Commonly known as: LIPITOR TAKE 1 TABLET BY MOUTH ONCE DAILY AT 6 PM   blood glucose meter kit and supplies Dispense based on patient and insurance preference. Use up to four times daily as directed. (FOR ICD-10 E10.9, E11.9).   cetirizine 10 MG tablet Commonly known as: ZYRTEC Take 1 tablet (10 mg total) by mouth daily. For allergy symptoms   folic acid 1 MG tablet Commonly known as: FOLVITE Take 1 tablet (1 mg total) by mouth daily.   glucose blood test strip Commonly known as: Accu-Chek Aviva  Plus Check blood sugars four times a day   Insulin Pen Needle 31G X 5 MM Misc 1 Device by Does not apply route as directed.   Lantus SoloStar 100 UNIT/ML Solostar Pen Generic drug: insulin glargine Inject 30 Units into the skin at bedtime.   levothyroxine 50 MCG tablet Commonly known as: SYNTHROID Take 1 tablet (50 mcg total) by mouth daily before breakfast.   megestrol 400 MG/10ML suspension Commonly known as: MEGACE Take 10 mLs (400 mg total) by mouth 2 (two) times daily. For appetite stimulation   metoprolol succinate 100 MG 24 hr tablet Commonly known as: TOPROL-XL TAKE 1 TABLET BY MOUTH ONCE DAILY WITH MEALS OR  IMMEDIATELY  FOLLOWING   sertraline 50 MG tablet Commonly known as:  ZOLOFT TAKE 1 TABLET BY MOUTH AT BEDTIME FOR ANXIETY AND FOR DEPRESSION   Tegaderm Hydrocolloid Thin Misc Apply 1 each topically 2 (two) times a week.   Vitamin D (Ergocalciferol) 1.25 MG (50000 UNIT) Caps capsule Commonly known as: DRISDOL Take 1 capsule by mouth once a week        Follow-up: Return in about 6 months (around 03/24/2021).  [name], M.D.

## 2020-09-23 ENCOUNTER — Ambulatory Visit (INDEPENDENT_AMBULATORY_CARE_PROVIDER_SITE_OTHER): Payer: Medicare HMO

## 2020-09-23 VITALS — Ht 68.0 in | Wt 160.0 lb

## 2020-09-23 DIAGNOSIS — Z0000 Encounter for general adult medical examination without abnormal findings: Secondary | ICD-10-CM | POA: Diagnosis not present

## 2020-09-23 LAB — CMP14+EGFR
ALT: 10 IU/L (ref 0–44)
AST: 23 IU/L (ref 0–40)
Albumin/Globulin Ratio: 0.6 — ABNORMAL LOW (ref 1.2–2.2)
Albumin: 3.1 g/dL — ABNORMAL LOW (ref 3.8–4.8)
Alkaline Phosphatase: 165 IU/L — ABNORMAL HIGH (ref 44–121)
BUN/Creatinine Ratio: 11 (ref 10–24)
BUN: 13 mg/dL (ref 8–27)
Bilirubin Total: 0.3 mg/dL (ref 0.0–1.2)
CO2: 17 mmol/L — ABNORMAL LOW (ref 20–29)
Calcium: 9.1 mg/dL (ref 8.6–10.2)
Chloride: 102 mmol/L (ref 96–106)
Creatinine, Ser: 1.16 mg/dL (ref 0.76–1.27)
Globulin, Total: 5 g/dL — ABNORMAL HIGH (ref 1.5–4.5)
Glucose: 89 mg/dL (ref 65–99)
Potassium: 4.3 mmol/L (ref 3.5–5.2)
Sodium: 136 mmol/L (ref 134–144)
Total Protein: 8.1 g/dL (ref 6.0–8.5)
eGFR: 70 mL/min/{1.73_m2} (ref >59)

## 2020-09-23 LAB — LIPID PANEL
Chol/HDL Ratio: 2.4 ratio (ref 0.0–5.0)
Cholesterol, Total: 59 mg/dL — ABNORMAL LOW (ref 100–199)
HDL: 25 mg/dL — ABNORMAL LOW (ref >39)
LDL Chol Calc (NIH): 19 mg/dL (ref 0–99)
Triglycerides: 60 mg/dL (ref 0–149)
VLDL Cholesterol Cal: 15 mg/dL (ref 5–40)

## 2020-09-23 LAB — TSH+FREE T4
Free T4: 1.13 ng/dL (ref 0.82–1.77)
TSH: 8.18 u[IU]/mL — ABNORMAL HIGH (ref 0.450–4.500)

## 2020-09-23 LAB — URIC ACID: Uric Acid: 10.1 mg/dL — ABNORMAL HIGH (ref 3.8–8.4)

## 2020-09-23 NOTE — Progress Notes (Signed)
Subjective:   Todd [name] is a 65 y.o. male who presents for an Initial Medicare Annual Wellness Visit.  Virtual Visit via Telephone Note  I connected with  Todd [name] on 09/23/20 at  2:45 PM EDT by telephone and verified that I am speaking with the correct person using two identifiers.  Location: Patient: Home Provider: [facility] Persons participating in the virtual visit: patient/Nurse Health Advisor   I discussed the limitations, risks, security and privacy concerns of performing an evaluation and management service by telephone and the availability of in person appointments. The patient expressed understanding and agreed to proceed.  Interactive audio and video telecommunications were attempted between this nurse and patient, however failed, due to patient having technical difficulties OR patient did not have access to video capability.  We continued and completed visit with audio only.  Some vital signs may be absent or patient reported.   Angely Dietz E Keltin Baird, LPN   Review of Systems     Cardiac Risk Factors include: advanced age (>40mn, >>30women);diabetes mellitus;dyslipidemia;hypertension;male gender;sedentary lifestyle;smoking/ tobacco exposure;microalbuminuria     Objective:    Today's Vitals   09/23/20 1342  Weight: 160 lb (72.6 kg)  Height: _0  (1.727 m)   Body mass index is 24.33 kg/m.  Advanced Directives 09/23/2020 02/12/2018 02/12/2018 02/03/2018 02/03/2018 11/01/2017 10/26/2017  Does Patient Have a Medical Advance Directive? Yes _1  No  Type of Advance Directive HGen[place]ain Chart? No - copy requested - - - - - -  Would patient like information on creating a medical advance directive? - No - Patient declined - No - Patient declined - No - Patient declined No - Patient declined    Current Medications (verified) Outpatient Encounter Medications as of 09/23/2020  Medication Sig  .  aspirin EC 81 MG tablet Take 81 mg by mouth daily.  .Marland Kitchenatorvastatin (LIPITOR) 40 MG tablet TAKE 1 TABLET BY MOUTH ONCE DAILY AT 6 PM  . blood glucose meter kit and supplies Dispense based on patient and insurance preference. Use up to four times daily as directed. (FOR ICD-10 E10.9, E11.9).  . cetirizine (ZYRTEC) 10 MG tablet Take 1 tablet (10 mg total) by mouth daily. For allergy symptoms  . folic acid (FOLVITE) 1 MG tablet Take 1 tablet (1 mg total) by mouth daily.  .Marland Kitchenglucose blood (ACCU-CHEK AVIVA PLUS) test strip Check blood sugars four times a day  . Hydroactive Dressings (TEGADERM HYDROCOLLOID THIN) MISC Apply 1 each topically 2 (two) times a week.  . insulin glargine (LANTUS SOLOSTAR) 100 UNIT/ML Solostar Pen Inject 30 Units into the skin at bedtime.  . Insulin Pen Needle 31G X 5 MM MISC 1 Device by Does not apply route as directed.  .Marland Kitchenlevothyroxine (SYNTHROID) 50 MCG tablet Take 1 tablet (50 mcg total) by mouth daily before breakfast.  . megestrol (MEGACE) 400 MG/10ML suspension Take 10 mLs (400 mg total) by mouth 2 (two) times daily. For appetite stimulation  . metoprolol succinate (TOPROL-XL) 100 MG 24 hr tablet TAKE 1 TABLET BY MOUTH ONCE DAILY WITH MEALS OR  IMMEDIATELY  FOLLOWING  . sertraline (ZOLOFT) 50 MG tablet TAKE 1 TABLET BY MOUTH AT BEDTIME FOR ANXIETY AND FOR DEPRESSION  . Vitamin D, Ergocalciferol, (DRISDOL) 1.25 MG (50000 UNIT) CAPS capsule Take 1 capsule by mouth once a week   No facility-administered encounter medications on file as of 09/23/2020.  Allergies (verified) Lisinopril   History: Past Medical History:  Diagnosis Date  . AKI (acute kidney injury) ([place])   . Constipated   . Diabetes mellitus without complication ([place])   . Diarrhea   . Elevated LFTs   . Goiter   . Gout   . Hyperlipidemia   . Hypertension   . Leukocytosis   . Reactive thrombocytosis   . Right BKA infection ([place]) 08/2016  . Right leg pain   . Sepsis due to undetermined organism  ([place])   . Thyroid disease   . Wound infection after surgery 08/2016   Past Surgical History:  Procedure Laterality Date  . ABDOMINAL AORTOGRAM N/A 08/11/2016   Procedure: Abdominal Aortogram;  Surgeon: Waynetta Sandy, MD;  Location: [facility];  Service: Cardiovascular;  Laterality: N/A;  . ABDOMINAL AORTOGRAM W/LOWER EXTREMITY N/A 08/15/2016   Procedure: Abdominal Aortogram w/Lower Extremity;  Surgeon: Elam Dutch, MD;  Location: [facility];  Service: Cardiovascular;  Laterality: N/A;  . AMPUTATION Right 08/17/2016   Procedure: RIGHT BELOW KNEE AMPUTATION;  Surgeon: Elam Dutch, MD;  Location: [facility];  Service: Vascular;  Laterality: Right;  . AMPUTATION Right 09/12/2016   Procedure: AMPUTATION ABOVE KNEE;  Surgeon: Newt Minion, MD;  Location: [facility];  Service: Orthopedics;  Laterality: Right;  . AMPUTATION Left 08/12/2016   Procedure: LEFT BELOW KNEE AMPUTATION;  Surgeon: Newt Minion, MD;  Location: [facility];  Service: Orthopedics;  Laterality: Left;  . AMPUTATION Left 11/01/2017   Procedure: LEFT ABOVE KNEE AMPUTATION;  Surgeon: Newt Minion, MD;  Location: [facility];  Service: Orthopedics;  Laterality: Left;  . APPLICATION OF WOUND VAC Right 09/12/2016   Procedure: APPLICATION OF WOUND VAC ABOVE KNEE;  Surgeon: Newt Minion, MD;  Location: [facility];  Service: Orthopedics;  Laterality: Right;  . LOWER EXTREMITY ANGIOGRAPHY Bilateral 08/11/2016   Procedure: Lower Extremity Angiography;  Surgeon: Waynetta Sandy, MD;  Location: [facility];  Service: Cardiovascular;  Laterality: Bilateral;  . PERIPHERAL VASCULAR BALLOON ANGIOPLASTY Left 08/11/2016   Procedure: Peripheral Vascular Balloon Angioplasty;  Surgeon: Waynetta Sandy, MD;  Location: [facility];  Service: Cardiovascular;  Laterality: Left;  SFA  . THYROID SURGERY     Family History  Problem Relation Age of Onset  . Heart disease Mother   . Pneumonia Father   . Diabetes Maternal  Aunt   . Diabetes Maternal Uncle    Social History   Socioeconomic History  . Marital status: Single    Spouse name: Not on file  . Number of children: Not on file  . Years of education: Not on file  . Highest education level: Not on file  Occupational History  . Occupation: retired    Comment: drove a Curator  . Smoking status: Current Every Day Smoker    Packs/day: 0.25    Years: 45.00    Pack years: 11.25    Types: Cigarettes    Last attempt to quit: 11/12/2014    Years since quitting: 5.8  . Smokeless tobacco: Never Used  Vaping Use  . Vaping Use: Never used  Substance and Sexual Activity  . Alcohol use: Not Currently    Alcohol/week: 0.0 standard drinks  . Drug use: No  . Sexual activity: Not Currently  Other Topics Concern  . Not on file  Social History Narrative   09/23/20 - Lives alone, uses a wheelchair, double amputee, not married, no children -  HHA comes in 3x per week to help him bathe and set up weekly meds.   Social Determinants of Health   Financial Resource Strain: Low Risk   . Difficulty of Paying Living Expenses: Not very hard  Food Insecurity: No Food Insecurity  . Worried About Charity fundraiser in the Last Year: Never true  . Ran Out of Food in the Last Year: Never true  Transportation Needs: No Transportation Needs  . Lack of Transportation (Medical): No  . Lack of Transportation (Non-Medical): No  Physical Activity: Inactive  . Days of Exercise per Week: 0 days  . Minutes of Exercise per Session: 0 min  Stress: No Stress Concern Present  . Feeling of Stress : Only a little  Social Connections: Socially Isolated  . Frequency of Communication with Friends and Family: More than three times a week  . Frequency of Social Gatherings with Friends and Family: More than three times a week  . Attends Religious Services: Never  . Active Member of Clubs or Organizations: No  . Attends Archivist Meetings: Never  . Marital  Status: Never married    Tobacco Counseling Ready to quit: Yes Counseling given: No   Clinical Intake:  Pre-visit preparation completed: Yes  Pain : No/denies pain     BMI - recorded: 24.33 Nutritional Status: BMI of 19-24  Normal Nutritional Risks: None Diabetes: Yes CBG done?: No (118 this morning per patient) Did pt. bring in CBG monitor from home?: No  How often do you need to have someone help you when you read instructions, pamphlets, or other written materials from your doctor or pharmacy?: 1 - Never  Nutrition Risk Assessment:  Has the patient had any N/V/D within the last 2 months?  No  Does the patient have any non-healing wounds?  No  Has the patient had any unintentional weight loss or weight gain?  No   Diabetes:  Is the patient diabetic?  Yes  If diabetic, was a CBG obtained today?  No  at home this am fasting: 118 per patient Did the patient bring in their glucometer from home?  No  How often do you monitor your CBG's? Twice per day.   Financial Strains and Diabetes Management:  Are you having any financial strains with the device, your supplies or your medication? No .  Does the patient want to be seen by Chronic Care Management for management of their diabetes?  No  Would the patient like to be referred to a Nutritionist or for Diabetic Management?  No   Diabetic Exams:  Diabetic Eye Exam: Completed 04/01/2019. Overdue for diabetic eye exam. Pt has been advised about the importance in completing this exam.  Diabetic Foot Exam: Semi-annual visits with Dr Dorris Fetch  Interpreter Needed?: No  Information entered by :: Alexandre Faries, LPN   Activities of Daily Living In your present state of health, do you have any difficulty performing the following activities: 09/23/2020  Hearing? N  Vision? N  Difficulty concentrating or making decisions? N  Walking or climbing stairs? Y  Dressing or bathing? Y  Doing errands, shopping? Y  Preparing Food and eating  ? N  Using the Toilet? N  Comment has bedside commode  In the past six months, have you accidently leaked urine? N  Do you have problems with loss of bowel control? N  Managing your Medications? N  Managing your Finances? N  Housekeeping or managing your Housekeeping? Y  Some recent data might be  hidden    Patient Care Team: [name], MD as PCP - General (Family Medicine) [name], MD (Inactive) as PCP - Cardiology (Cardiology) Newt Minion, MD as Consulting Physician (Orthopedic Surgery) Shea Evans [name], LCSW as Social Worker (Licensed Clinical Social Worker) Ilean China, RN as Case Manager  Indicate any recent [place] you may have received from other than [facility] providers in the past year (date may be approximate).     Assessment:   This is a routine wellness examination for Sabri.  Hearing/Vision screen  Hearing Screening   _0  _1  _2  _3  _4  _5  _6  _7  _8   Right ear:           Left ear:           Comments: Denies hearing difficulty  Vision Screening Comments: Routine eye exams with [facility] in [place] - past due for eye exam. Last 2020  Dietary issues and exercise activities discussed: Current Exercise Habits: The patient does not participate in regular exercise at present, Exercise limited by: orthopedic condition(s);Other - see comments (bilateral AKA)  Goals    .  "I don't want to fall"      Current Barriers:  [name] Knowledge Deficits related to fall precautions . Decreased adherence to prescribed treatment for fall prevention  Nurse Case Manager Clinical Goal(s):  [name] Over the next 30 days, patient will demonstrate improved adherence to prescribed treatment plan for decreasing falls as evidenced by patient reporting and review of EMR . Over the next 30days, patient will verbalize using fall risk reduction strategies discussed . Over the next 90 days, patient will not experience additional  falls  Interventions:  . Provided verbal education re: Potential causes of falls and Fall prevention strategies . Assessed for falls since last encounter . Discussed use of wheelchair . Discussed ability to perform ADLs o patient lives in a handicap accessible apartment . Discussed that he has an aid that comes 3 days a week . Discussed family and social support . Assessed patients knowledge of fall risk prevention secondary to previously provided education. . Previously confirmed that patient has life alert system  Patient Self Care Activities:  . Utilize wheelchair (assistive device) appropriately with all ambulation . De-clutter walkways . Change positions slowly . Wear secure fitting shoes at all times with ambulation . Utilize home lighting for dim lit areas . Have self and pet awareness at all times  Please see past updates related to this goal by clicking on the "Past Updates" button in the selected goal      .  Apply for Medicaid      Timeframe:  Short Term Goal Priority:  High Start Date:                             Expected End Date:                       Follow Up Date 08/12/20   . Patient/sister will talk with member of CCM team about applying for Medicaid    .  Client wants to talk with LCSW in next 30 days to discuss grief issues of clinet related to recent death of his mother (his mother died on 05-29-19) (pt-stated)      Current Barriers:  [name] Mobility challenges in client with Chronic Diagnoses of HTN, Type 2 DM, CKD, HLD  Clinical Social Work Clinical Goal(s):  [name] LCSW will talk  with client in next 30 days to discuss client management of grief issues faced  Interventions:  Talked with [name], sister of client about client grief issues over the recent death of his mother  Talked with [name], about client use of alcohol and treatment program in area related to alcohol ([facility] in [place], [place])  Talked with [name] about transport needs of client  Talked  with [name] about mobility challenges of client  Talked with [name] about CCM support services   Patient Self Care Activities:  Takes medications as prescribed   Self Care Deficit:    Mobility challenges Alcohol use issue     Initial goal documentation         .  manage grief issues faced      Timeframe:  Short-Term Goal Priority:  Medium Progress: On Track Start Date:        09/21/20                     Expected End Date:      12/21/20                 Follow Up Date 10/27/20   Manage My Emotions (Patient) Manage Grief issues faced    Why is this important?    When you are stressed, down or upset, your body reacts too.   For example, your blood pressure may get higher; you may have a headache or stomachache.   When your emotions get the best of you, your body's ability to fight off cold and flu gets weak.   These steps will help you manage your emotions.     Patient Self Care Activities:  . Self administers medications as prescribed . Attends all scheduled provider appointments  Patient Coping Strengths:  . Family . Friends  Patient Self Care Deficits:  [name] Mobility issues  Patient Goals:  - spend time or talk with others at least 2 to 3 times per week - practice relaxation or meditation daily - keep a calendar with appointment dates  Follow Up Plan: LCSW to call client on 10/27/20     .  Monitor and Manage My Blood Sugar-Diabetes Type 2      Timeframe:  Long-Range Goal Priority:  Medium Start Date:                             Expected End Date:                       Follow Up Date 08/12/20    . check blood sugar at prescribed times . check blood sugar if I feel it is too high or too low . enter blood sugar readings and medication or insulin into daily log . take the blood sugar log to all doctor visits . take the blood sugar meter to all doctor visits  . Patient should call PCP if he has any blood sugar readings below 70 or above 200   Why is this  important?    Checking your blood sugar at home helps to keep it from getting very high or very low.   Writing the results in a diary or log helps the doctor know how to care for you.   Your blood sugar log should have the time, date and the results.   Also, write down the amount of insulin or other medicine that you take.   Other  information, like what you ate, exercise done and how you were feeling, will also be helpful.     Notes:     .  Track and Manage My Blood Pressure-Hypertension      Timeframe:  Long-Range Goal Priority:  Medium Start Date:                             Expected End Date:                       Follow Up Date 08/12/20   . check blood pressure 3 times per week . write blood pressure results in a log or diary . Take blood pressure log to PCP appointments . Call PCP with any readings outside of recommended range   Why is this important?    You won't feel high blood pressure, but it can still hurt your blood vessels.   High blood pressure can cause heart or kidney problems. It can also cause a stroke.   Making lifestyle changes like losing a little weight or eating less salt will help.   Checking your blood pressure at home and at different times of the day can help to control blood pressure.   If the doctor prescribes medicine remember to take it the way the doctor ordered.   Call the office if you cannot afford the medicine or if there are questions about it.     Notes:       Depression Screen PHQ 2/9 Scores 09/23/2020 09/22/2020 09/21/2020 03/23/2020 01/27/2020 12/06/2019 10/02/2019  PHQ - 2 Score 0 0 2 0 0 0 0  PHQ- 9 Score 3 - 3 - - - -  Exception Documentation - - - - - - -    Fall Risk Fall Risk  09/23/2020 09/22/2020 08/13/2020 03/23/2020 01/27/2020  Falls in the past year? 0 0 - 0 0  Number falls in past yr: 0 - - 0 -  Injury with Fall? 0 - - 0 -  Risk for fall due to : - - Impaired mobility;Impaired vision No Fall Risks -  Follow up - - Falls  evaluation completed Falls evaluation completed Falls evaluation completed    FALL RISK PREVENTION PERTAINING TO THE HOME:  Any stairs in or around the home? No  If so, are there any without handrails? No  Home free of loose throw rugs in walkways, pet beds, electrical cords, etc? Yes  Adequate lighting in your home to reduce risk of falls? Yes   ASSISTIVE DEVICES UTILIZED TO PREVENT FALLS:  Life alert? No  Use of a cane, walker or w/c? Yes  Grab bars in the bathroom? Yes  Shower chair or bench in shower? Yes  Elevated toilet seat or a handicapped toilet? Yes   TIMED UP AND GO:  Was the test performed? No . Telephonic visit/ patient has bilat AKA  Cognitive Function: Normal cognitive status assessed by direct observation by this Nurse Health Advisor. No abnormalities found.         Immunizations Immunization History  Administered Date(s) Administered  . Influenza,inj,Quad PF,6+ Mos 05/20/2015, 04/07/2017, 03/09/2018, 05/01/2019  . Influenza-Unspecified 05/10/2011  . PFIZER Comirnaty(Gray Top)Covid-19 Tri-Sucrose Vaccine 07/28/2020, 08/18/2020, 08/18/2020  . PPD Test 09/16/2016    TDAP status: Due, Education has been provided regarding the importance of this vaccine. Advised may receive this vaccine at local pharmacy or Health Dept. Aware to provide a copy of the vaccination record  if obtained from local pharmacy or Health Dept. Verbalized acceptance and understanding.  Flu Vaccine status: Due, Education has been provided regarding the importance of this vaccine. Advised may receive this vaccine at local pharmacy or Health Dept. Aware to provide a copy of the vaccination record if obtained from local pharmacy or Health Dept. Verbalized acceptance and understanding.  Pneumococcal vaccine status: Due, Education has been provided regarding the importance of this vaccine. Advised may receive this vaccine at local pharmacy or Health Dept. Aware to provide a copy of the vaccination  record if obtained from local pharmacy or Health Dept. Verbalized acceptance and understanding.  Covid-19 vaccine status: Completed vaccines  Qualifies for Shingles Vaccine? Yes   Zostavax completed No   Shingrix Completed?: No.    Education has been provided regarding the importance of this vaccine. Patient has been advised to call insurance company to determine out of pocket expense if they have not yet received this vaccine. Advised may also receive vaccine at local pharmacy or Health Dept. Verbalized acceptance and understanding.  Screening Tests Health Maintenance  Topic Date Due  . Hepatitis C Screening  Never done  . OPHTHALMOLOGY EXAM  03/31/2020  . PNA vac Low Risk Adult (1 of 2 - PCV13) Never done  . TETANUS/TDAP  09/22/2021 (Originally 04/27/1975)  . INFLUENZA VACCINE  01/11/2021  . HEMOGLOBIN A1C  02/03/2021  . URINE MICROALBUMIN  08/06/2021  . COLONOSCOPY (Pts 45-65yr Insurance coverage will need to be confirmed)  10/06/2024  . HIV Screening  Completed  . HPV VACCINES  Aged Out  . FOOT EXAM  Discontinued  . COVID-19 Vaccine  Discontinued    Health Maintenance  Health Maintenance Due  Topic Date Due  . Hepatitis C Screening  Never done  . OPHTHALMOLOGY EXAM  03/31/2020  . PNA vac Low Risk Adult (1 of 2 - PCV13) Never done    Colorectal cancer screening: Type of screening: Colonoscopy. Completed 10/07/2014. Repeat every 1 years  Lung Cancer Screening: (Low Dose CT Chest recommended if Age 66-80years, 30 pack-year currently smoking OR have quit w/in 15years.) does not qualify.   Additional Screening:  Hepatitis C Screening: does qualify; needs this drawn with next routine labs.  Vision Screening: Recommended annual ophthalmology exams for early detection of glaucoma and other disorders of the eye. Is the patient up to date with their annual eye exam?  No  Who is the provider or what is the name of the [facility] which the patient [facility]ends [facility] is not established with a provider, would they like to be referred to a provider to establish care? No .   Dental Screening: Recommended annual dental exams for proper oral hygiene  Community Resource Referral / Chronic Care Management: CRR required this visit?  No   CCM required this visit?  No      Plan:     I have personally reviewed and noted the following in the patient's chart:   . Medical and social history . Use of alcohol, tobacco or illicit drugs  . Current medications and supplements . Functional ability and status . Nutritional status . Physical activity . Advanced directives . List of other physicians . Hospitalizations, surgeries, and ER visits in previous 12 months . Vitals . Screenings to include cognitive, depression, and falls . Referrals and appointments  In addition, I have reviewed and discussed with patient certain preventive protocols, quality metrics, and best practice recommendations. A written personalized care plan  for preventive services as well as general preventive health recommendations were provided to patient.     Sandrea Hammond, LPN   6/00/4599   Nurse Notes: None

## 2020-09-23 NOTE — Patient Instructions (Addendum)
Todd [name] , Thank you for taking time to come for your Medicare Wellness Visit. I appreciate your ongoing commitment to your health goals. Please review the following plan we discussed and let me know if I can assist you in the future.   Screening recommendations/referrals: Colonoscopy: Done 10/07/2014 - Repeat in 10 years Recommended yearly ophthalmology/optometry visit for glaucoma screening and checkup Recommended yearly dental visit for hygiene and checkup  Vaccinations: Influenza vaccine: Declined Pneumococcal vaccine: Due Tdap vaccine: Due Shingles vaccine: Shingrix discussed. Please contact your pharmacy for coverage information.    Covid-19: Done 07/28/20 and 08/18/20 - due for booster in 6 months  Advanced directives: Please bring a copy of your health care power of attorney and living will to the office to be added to your chart at your convenience.  Conditions/risks identified: Try to work on seated exercises. Continue practicing fall prevention. Eat a healthy diabetic diet.  Next appointment: Follow up in one year for your annual wellness visit.   Preventive Care 100 Years and Older, Male  Preventive care refers to lifestyle choices and visits with your health care provider that can promote health and wellness. What does preventive care include?  A yearly physical exam. This is also called an annual well check.  Dental exams once or twice a year.  Routine eye exams. Ask your health care provider how often you should have your eyes checked.  Personal lifestyle choices, including:  Daily care of your teeth and gums.  Regular physical activity.  Eating a healthy diet.  Avoiding tobacco and drug use.  Limiting alcohol use.  Practicing safe sex.  Taking low doses of aspirin every day.  Taking vitamin and mineral supplements as recommended by your health care provider. What happens during an annual well check? The services and screenings done by your health care  provider during your annual well check will depend on your age, overall health, lifestyle risk factors, and family history of disease. Counseling  Your health care provider may ask you questions about your:  Alcohol use.  Tobacco use.  Drug use.  Emotional well-being.  Home and relationship well-being.  Sexual activity.  Eating habits.  History of falls.  Memory and ability to understand (cognition).  Work and work Statistician. Screening  You may have the following tests or measurements:  Height, weight, and BMI.  Blood pressure.  Lipid and cholesterol levels. These may be checked every 5 years, or more frequently if you are over 27 years old.  Skin check.  Lung cancer screening. You may have this screening every year starting at age 69 if you have a 30-pack-year history of smoking and currently smoke or have quit within the past 15 years.  Fecal occult blood test (FOBT) of the stool. You may have this test every year starting at age 98.  Flexible sigmoidoscopy or colonoscopy. You may have a sigmoidoscopy every 5 years or a colonoscopy every 10 years starting at age 41.  Prostate cancer screening. Recommendations will vary depending on your family history and other risks.  Hepatitis C blood test.  Hepatitis B blood test.  Sexually transmitted disease (STD) testing.  Diabetes screening. This is done by checking your blood sugar (glucose) after you have not eaten for a while (fasting). You may have this done every 1-3 years.  Abdominal aortic aneurysm (AAA) screening. You may need this if you are a current or former smoker.  Osteoporosis. You may be screened starting at age 90 if you are at high  risk. Talk with your health care provider about your test results, treatment options, and if necessary, the need for more tests. Vaccines  Your health care provider may recommend certain vaccines, such as:  Influenza vaccine. This is recommended every year.  Tetanus,  diphtheria, and acellular pertussis (Tdap, Td) vaccine. You may need a Td booster every 10 years.  Zoster vaccine. You may need this after age 75.  Pneumococcal 13-valent conjugate (PCV13) vaccine. One dose is recommended after age 37.  Pneumococcal polysaccharide (PPSV23) vaccine. One dose is recommended after age 6. Talk to your health care provider about which screenings and vaccines you need and how often you need them. This information is not intended to replace advice given to you by your health care provider. Make sure you discuss any questions you have with your health care provider. Document Released: 06/26/2015 Document Revised: 02/17/2016 Document Reviewed: 03/31/2015 Elsevier Interactive Patient Education  2017 [employer] Prevention in the Home Falls can cause injuries. They can happen to people of all ages. There are many things you can do to make your home safe and to help prevent falls. What can I do on the outside of my home?  Regularly fix the edges of walkways and driveways and fix any cracks.  Remove anything that might make you trip as you walk through a door, such as a raised step or threshold.  Trim any bushes or trees on the path to your home.  Use bright outdoor lighting.  Clear any walking paths of anything that might make someone trip, such as rocks or tools.  Regularly check to see if handrails are loose or broken. Make sure that both sides of any steps have handrails.  Any raised decks and porches should have guardrails on the edges.  Have any leaves, snow, or ice cleared regularly.  Use sand or salt on walking paths during winter.  Clean up any spills in your garage right away. This includes oil or grease spills. What can I do in the bathroom?  Use night lights.  Install grab bars by the toilet and in the tub and shower. Do not use towel bars as grab bars.  Use non-skid mats or decals in the tub or shower.  If you need to sit down in  the shower, use a plastic, non-slip stool.  Keep the floor dry. Clean up any water that spills on the floor as soon as it happens.  Remove soap buildup in the tub or shower regularly.  Attach bath mats securely with double-sided non-slip rug tape.  Do not have throw rugs and other things on the floor that can make you trip. What can I do in the bedroom?  Use night lights.  Make sure that you have a light by your bed that is easy to reach.  Do not use any sheets or blankets that are too big for your bed. They should not hang down onto the floor.  Have a firm chair that has side arms. You can use this for support while you get dressed.  Do not have throw rugs and other things on the floor that can make you trip. What can I do in the kitchen?  Clean up any spills right away.  Avoid walking on wet floors.  Keep items that you use a lot in easy-to-reach places.  If you need to reach something above you, use a strong step stool that has a grab bar.  Keep electrical cords out of the way.  Do not use floor polish or wax that makes floors slippery. If you must use wax, use non-skid floor wax.  Do not have throw rugs and other things on the floor that can make you trip. What can I do with my stairs?  Do not leave any items on the stairs.  Make sure that there are handrails on both sides of the stairs and use them. Fix handrails that are broken or loose. Make sure that handrails are as long as the stairways.  Check any carpeting to make sure that it is firmly attached to the stairs. Fix any carpet that is loose or worn.  Avoid having throw rugs at the top or bottom of the stairs. If you do have throw rugs, attach them to the floor with carpet tape.  Make sure that you have a light switch at the top of the stairs and the bottom of the stairs. If you do not have them, ask someone to add them for you. What else can I do to help prevent falls?  Wear shoes that:  Do not have high  heels.  Have rubber bottoms.  Are comfortable and fit you well.  Are closed at the toe. Do not wear sandals.  If you use a stepladder:  Make sure that it is fully opened. Do not climb a closed stepladder.  Make sure that both sides of the stepladder are locked into place.  Ask someone to hold it for you, if possible.  Clearly mark and make sure that you can see:  Any grab bars or handrails.  First and last steps.  Where the edge of each step is.  Use tools that help you move around (mobility aids) if they are needed. These include:  Canes.  Walkers.  Scooters.  Crutches.  Turn on the lights when you go into a dark area. Replace any light bulbs as soon as they burn out.  Set up your furniture so you have a clear path. Avoid moving your furniture around.  If any of your floors are uneven, fix them.  If there are any pets around you, be aware of where they are.  Review your medicines with your doctor. Some medicines can make you feel dizzy. This can increase your chance of falling. Ask your doctor what other things that you can do to help prevent falls. This information is not intended to replace advice given to you by your health care provider. Make sure you discuss any questions you have with your health care provider. Document Released: 03/26/2009 Document Revised: 11/05/2015 Document Reviewed: 07/04/2014 Elsevier Interactive Patient Education  2017 [employer].

## 2020-09-28 ENCOUNTER — Other Ambulatory Visit: Payer: Self-pay | Admitting: Family Medicine

## 2020-09-28 ENCOUNTER — Ambulatory Visit: Payer: Medicare HMO | Admitting: *Deleted

## 2020-09-28 DIAGNOSIS — E782 Mixed hyperlipidemia: Secondary | ICD-10-CM

## 2020-09-28 DIAGNOSIS — E039 Hypothyroidism, unspecified: Secondary | ICD-10-CM

## 2020-09-28 DIAGNOSIS — E1122 Type 2 diabetes mellitus with diabetic chronic kidney disease: Secondary | ICD-10-CM | POA: Diagnosis not present

## 2020-09-28 DIAGNOSIS — N184 Chronic kidney disease, stage 4 (severe): Secondary | ICD-10-CM

## 2020-09-28 DIAGNOSIS — Z794 Long term (current) use of insulin: Secondary | ICD-10-CM

## 2020-09-28 DIAGNOSIS — I10 Essential (primary) hypertension: Secondary | ICD-10-CM | POA: Diagnosis not present

## 2020-09-28 DIAGNOSIS — N183 Chronic kidney disease, stage 3 unspecified: Secondary | ICD-10-CM | POA: Diagnosis not present

## 2020-09-28 MED ORDER — ALLOPURINOL 100 MG PO TABS: 100.0000 mg | ORAL_TABLET | Freq: Every day | ORAL | 6 refills | Status: DC

## 2020-09-28 MED ORDER — LEVOTHYROXINE SODIUM 75 MCG PO TABS: 75.0000 ug | ORAL_TABLET | Freq: Every day | ORAL | 2 refills | Status: DC

## 2020-10-09 ENCOUNTER — Encounter: Payer: Self-pay | Admitting: *Deleted

## 2020-10-09 NOTE — Chronic Care Management (AMB) (Signed)
Chronic Care Management   CCM RN Visit Note  09/28/2020 Name: Todd [name] MRN: [id number] DOB: 07-21-1955  Subjective: Todd [name] is a 65 y.o. year old male who is a primary care patient of [name], [name], MD. The care management team was consulted for assistance with disease management and care coordination needs.    Engaged with patient by telephone for follow up visit in response to provider referral for case management and/or care coordination services.   Consent to Services:  The patient was given information about Chronic Care Management services, agreed to services, and gave verbal consent prior to initiation of services.  Please see initial visit note for detailed documentation.   Patient agreed to services and verbal consent obtained.   Assessment: Review of patient past medical history, allergies, medications, health status, including review of consultants reports, laboratory and other test data, was performed as part of comprehensive evaluation and provision of chronic care management services.   SDOH (Social Determinants of Health) assessments and interventions performed:    CCM Care Plan  Allergies  Allergen Reactions  . Lisinopril Other (See Comments)    Hyperkalemia / Renal failure    Outpatient Encounter Medications as of 09/28/2020  Medication Sig  . aspirin EC 81 MG tablet Take 81 mg by mouth daily.  [name] atorvastatin (LIPITOR) 40 MG tablet TAKE 1 TABLET BY MOUTH ONCE DAILY AT 6 PM  . blood glucose meter kit and supplies Dispense based on patient and insurance preference. Use up to four times daily as directed. (FOR ICD-10 E10.9, E11.9).  . cetirizine (ZYRTEC) 10 MG tablet Take 1 tablet (10 mg total) by mouth daily. For allergy symptoms  . folic acid (FOLVITE) 1 MG tablet Take 1 tablet (1 mg total) by mouth daily.  [name] glucose blood (ACCU-CHEK AVIVA PLUS) test strip Check blood sugars four times a day  . Hydroactive Dressings (TEGADERM HYDROCOLLOID THIN) MISC  Apply 1 each topically 2 (two) times a week.  . insulin glargine (LANTUS SOLOSTAR) 100 UNIT/ML Solostar Pen Inject 30 Units into the skin at bedtime.  . Insulin Pen Needle 31G X 5 MM MISC 1 Device by Does not apply route as directed.  . megestrol (MEGACE) 400 MG/10ML suspension Take 10 mLs (400 mg total) by mouth 2 (two) times daily. For appetite stimulation  . metoprolol succinate (TOPROL-XL) 100 MG 24 hr tablet TAKE 1 TABLET BY MOUTH ONCE DAILY WITH MEALS OR  IMMEDIATELY  FOLLOWING  . sertraline (ZOLOFT) 50 MG tablet TAKE 1 TABLET BY MOUTH AT BEDTIME FOR ANXIETY AND FOR DEPRESSION  . Vitamin D, Ergocalciferol, (DRISDOL) 1.25 MG (50000 UNIT) CAPS capsule Take 1 capsule by mouth once a week  . [DISCONTINUED] levothyroxine (SYNTHROID) 50 MCG tablet Take 1 tablet (50 mcg total) by mouth daily before breakfast.   No facility-administered encounter medications on file as of 09/28/2020.    Patient Active Problem List   Diagnosis Date Noted  . Vitamin D deficiency 06/17/2019  . Essential hypertension, benign 08/30/2018  . Mixed hyperlipidemia 07/17/2018  . CKD stage 3 due to type 2 diabetes mellitus ([place]) 02/14/2018  . Hyponatremia 02/14/2018  . Medically noncompliant 02/14/2018  . S/P AKA (above knee amputation) bilateral ([place]) 02/12/2018  . Current smoker 02/03/2018  . Leukocytosis 02/03/2018  . Reactive thrombocytosis 02/03/2018  . Urinary incontinence 08/14/2017  . Transaminitis   . Type 2 diabetes mellitus with stage 4 chronic kidney disease, with long-term current use of insulin ([place]) 07/21/2016  . Hyperuricemia 06/21/2016  . HTN (hypertension)  01/05/2016  . Diabetes type 2, uncontrolled ([place]) 01/05/2016  . Hypothyroidism 05/10/2011    Conditions to be addressed/monitored:HTN and DMII  Care Plan : RNCM: Diabetes Type 2 (Adult)  Updates made by Ilean China, RN since 10/09/2020 12:00 AM    Problem: Glycemic Management (Diabetes, Type 2)   Priority: Medium    Long-Range Goal:  Glycemic Management Optimized   This Visit's Progress: On track  Recent Progress: On track  Priority: Medium  Note:   Objective: Lab Results  Component Value Date   HGBA1C 6.0 (H) 08/06/2020   HGBA1C 6.5 01/27/2020   HGBA1C 6.8 09/30/2019   Lab Results  Component Value Date   LDLCALC 19 09/22/2020   CREATININE 1.16 09/22/2020   Current Barriers:  . Chronic Disease Management support and education needs related to diabetes . Unable to independently drive. Has to have assistance with some ADLs and IADLs.  Nurse Case Manager Clinical Goal(s):  . patient will work with endocrinologist to address needs related to diabetes . patient will meet with RN Care Manager to address self-management of diabetes . the patient will demonstrate ongoing self health care management ability as evidenced by maintaining an A1C within provider recommended range*  Interventions:  . 1:1 collaboration with [name], MD regarding development and update of comprehensive plan of care as evidenced by provider attestation and co-signature . Inter-disciplinary care team collaboration (see longitudinal plan of care) . Evaluation of current treatment plan related to diabetes and patient's adherence to plan as established by provider. . Chart reviewed including relevant office notes and lab results . Reviewed and discussed medications and changes . Discussed home blood sugar testing . Advised to take blood sugar log to endocrinology appointments . Encouraged to continue testing and recording blood sugar 3 times a day and to call endocrinologist with any readings outside of recommended range . Reinforced need to eat meals at regular intervals and to focus on lean proteins and vegetables and to limit sugar and simple carbohydrates . Reviewed upcoming appointments . Provided with RNCM contact number and encouraged to reach out as needed  Patient Goals/Self-Care Activities Over the next 60 days, patient  will: . check blood sugar at prescribed times . check blood sugar if I feel it is too high or too low . enter blood sugar readings and medication or insulin into daily log . take the blood sugar log to all doctor visits . take the blood sugar meter to all doctor visits  . Eat meals at regular intervals and avoid simple carbohydrates and sugars . Patient should call endocrinologist if he has any blood sugar readings below 70 or above 200    Care Plan : RNCM: Hypertension (Adult)  Updates made by Ilean China, RN since 10/09/2020 12:00 AM    Problem: Hypertension (Hypertension)     Long-Range Goal: Hypertension Monitored   This Visit's Progress: Not on track  Recent Progress: Not on track  Priority: Medium  Note:   Objective: BP Readings from Last 3 Encounters:  09/22/20 106/62  08/13/20 (!) 158/84  03/23/20 131/82   Current Barriers:  . Chronic Disease Management support and education needs related to hypertension . Does not check blood pressure regularly . Unable to drive  Nurse Case Manager Clinical Goal(s):  [name] Patient will work with RN Care Manager to address needs related to self-management of hypertension . Patient will demonstrate improved adherence to prescribed treatment plan for hypertension as evidenced by checking and recording blood pressure at least  3 times a week  Interventions:  . 1:1 collaboration with [name], MD regarding development and update of comprehensive plan of care as evidenced by provider attestation and co-signature . Inter-disciplinary care team collaboration (see longitudinal plan of care) . Evaluation of current treatment plan related to hypertension and patient's adherence to plan as established by provider. . Chart reviewed including relevant office notes and lab results . Reviewed and discussed medications: Metoprolol 130m daily o Patient is compliant with medication and does not have any problems with affordability . Reinforced  need to check and record his blood pressure at least 3 times per week . Provided rationale for checking blood pressure regularly . Education provided on importance of low sodium diet . Encouraged patient to take blood pressure log to PCP appointments for review . Reviewed upcoming appointments . [facility]  Patient Goals/Self-Care Activities Over the next 60 days, patient will: . check blood pressure 3 times per week . write blood pressure results in a log or diary . Take blood pressure log to PCP appointments . Call PCP with any readings outside of recommended range . Eat a low sodium, DASH diet . Call RN Care Manager as needed [phone]. Call PCP with any readings outside of recommended range [phone]    Follow Up Plan: . Telephone follow up appointment with care management team member scheduled for: RN Care Manager on 10/15/20 . The patient has been provided with contact information for the care management team and has been advised to call with any health related questions or concerns.  . Next PCP appointment scheduled for:  03/30/21 with Dr SLivia Snellen. Next endocrinology appointment scheduled for: 02/16/21 with endocrinologist  KChong Sicilian BSN, RN-BC [facility] Direct Dial: [phone]

## 2020-10-09 NOTE — Patient Instructions (Signed)
Visit Information  PATIENT GOALS: Goals Addressed            This Visit's Progress   . Monitor and Manage My Blood Sugar-Diabetes Type 2   On track    Timeframe:  Long-Range Goal Priority:  Medium Start Date:                             Expected End Date:                       Follow Up Date 10/15/20    . check blood sugar at prescribed times . check blood sugar if I feel it is too high or too low . enter blood sugar readings and medication or insulin into daily log . take the blood sugar log to all doctor visits . take the blood sugar meter to all doctor visits  . Eat meals at regular intervals and avoid simple carbohydrates and sugars . Patient should call endocrinologist if he has any blood sugar readings below 70 or above 200   Why is this important?    Checking your blood sugar at home helps to keep it from getting very high or very low.   Writing the results in a diary or log helps the doctor know how to care for you.   Your blood sugar log should have the time, date and the results.   Also, write down the amount of insulin or other medicine that you take.   Other information, like what you ate, exercise done and how you were feeling, will also be helpful.     Notes:     . Track and Manage My Blood Pressure-Hypertension   Not on track    Timeframe:  Long-Range Goal Priority:  Medium Start Date:                             Expected End Date:                       Follow Up Date 10/15/20   . check blood pressure 3 times per week . write blood pressure results in a log or diary . Take blood pressure log to PCP appointments . Call PCP with any readings outside of recommended range . Eat a low sodium, DASH diet . Call RN Care Manager as needed [phone] . Call PCP with any readings outside of recommended range [phone]   Why is this important?    You won't feel high blood pressure, but it can still hurt your blood vessels.   High blood pressure can cause  heart or kidney problems. It can also cause a stroke.   Making lifestyle changes like losing a little weight or eating less salt will help.   Checking your blood pressure at home and at different times of the day can help to control blood pressure.   If the doctor prescribes medicine remember to take it the way the doctor ordered.   Call the office if you cannot afford the medicine or if there are questions about it.     Notes:        The patient verbalized understanding of instructions, educational materials, and care plan provided today and declined offer to receive copy of patient instructions, educational materials, and care plan.   Follow Up Plan: . Telephone follow up appointment with  care management team member scheduled for: RN Care Manager on 10/15/20 . The patient has been provided with contact information for the care management team and has been advised to call with any health related questions or concerns.  . Next PCP appointment scheduled for:  03/30/21 with Dr Livia Snellen . Next endocrinology appointment scheduled for: 02/16/21 with endocrinologist  Chong Sicilian, BSN, RN-BC [place] [facility] [place] [facility] Direct Dial: [phone]

## 2020-10-15 ENCOUNTER — Ambulatory Visit (INDEPENDENT_AMBULATORY_CARE_PROVIDER_SITE_OTHER): Payer: Medicare HMO | Admitting: *Deleted

## 2020-10-15 DIAGNOSIS — I10 Essential (primary) hypertension: Secondary | ICD-10-CM

## 2020-10-15 DIAGNOSIS — N184 Chronic kidney disease, stage 4 (severe): Secondary | ICD-10-CM | POA: Diagnosis not present

## 2020-10-15 DIAGNOSIS — E1122 Type 2 diabetes mellitus with diabetic chronic kidney disease: Secondary | ICD-10-CM | POA: Diagnosis not present

## 2020-10-15 DIAGNOSIS — Z794 Long term (current) use of insulin: Secondary | ICD-10-CM | POA: Diagnosis not present

## 2020-10-19 ENCOUNTER — Telehealth: Payer: Self-pay

## 2020-10-19 ENCOUNTER — Telehealth: Payer: Self-pay | Admitting: Nurse Practitioner

## 2020-10-19 NOTE — Chronic Care Management (AMB) (Signed)
Chronic Care Management   CCM RN Visit Note  10/15/2020 Name: Todd [name] MRN: [id number] DOB: 1955/08/09  Subjective: Todd [name] is a 65 y.o. year old male who is a primary care patient of [name], [name], MD. The care management team was consulted for assistance with disease management and care coordination needs.    Engaged with patient by telephone for follow up visit in response to provider referral for case management and/or care coordination services.   Consent to Services:  The patient was given information about Chronic Care Management services, agreed to services, and gave verbal consent prior to initiation of services.  Please see initial visit note for detailed documentation.   Patient agreed to services and verbal consent obtained.   Assessment: Review of patient past medical history, allergies, medications, health status, including review of consultants reports, laboratory and other test data, was performed as part of comprehensive evaluation and provision of chronic care management services.   SDOH (Social Determinants of Health) assessments and interventions performed:    CCM Care Plan  Allergies  Allergen Reactions  . Lisinopril Other (See Comments)    Hyperkalemia / Renal failure    Outpatient Encounter Medications as of 10/15/2020  Medication Sig  . allopurinol (ZYLOPRIM) 100 MG tablet Take 1 tablet (100 mg total) by mouth daily.  [name] aspirin EC 81 MG tablet Take 81 mg by mouth daily.  [name] atorvastatin (LIPITOR) 40 MG tablet TAKE 1 TABLET BY MOUTH ONCE DAILY AT 6 PM  . blood glucose meter kit and supplies Dispense based on patient and insurance preference. Use up to four times daily as directed. (FOR ICD-10 E10.9, E11.9).  . cetirizine (ZYRTEC) 10 MG tablet Take 1 tablet (10 mg total) by mouth daily. For allergy symptoms  . folic acid (FOLVITE) 1 MG tablet Take 1 tablet (1 mg total) by mouth daily.  [name] glucose blood (ACCU-CHEK AVIVA PLUS) test strip Check blood  sugars four times a day  . Hydroactive Dressings (TEGADERM HYDROCOLLOID THIN) MISC Apply 1 each topically 2 (two) times a week.  . insulin glargine (LANTUS SOLOSTAR) 100 UNIT/ML Solostar Pen Inject 30 Units into the skin at bedtime.  . Insulin Pen Needle 31G X 5 MM MISC 1 Device by Does not apply route as directed.  [name] levothyroxine (SYNTHROID) 75 MCG tablet Take 1 tablet (75 mcg total) by mouth daily before breakfast.  . megestrol (MEGACE) 400 MG/10ML suspension Take 10 mLs (400 mg total) by mouth 2 (two) times daily. For appetite stimulation  . metoprolol succinate (TOPROL-XL) 100 MG 24 hr tablet TAKE 1 TABLET BY MOUTH ONCE DAILY WITH MEALS OR  IMMEDIATELY  FOLLOWING  . sertraline (ZOLOFT) 50 MG tablet TAKE 1 TABLET BY MOUTH AT BEDTIME FOR ANXIETY AND FOR DEPRESSION  . Vitamin D, Ergocalciferol, (DRISDOL) 1.25 MG (50000 UNIT) CAPS capsule Take 1 capsule by mouth once a week   No facility-administered encounter medications on file as of 10/15/2020.    Patient Active Problem List   Diagnosis Date Noted  . Vitamin D deficiency 06/17/2019  . Essential hypertension, benign 08/30/2018  . Mixed hyperlipidemia 07/17/2018  . CKD stage 3 due to type 2 diabetes mellitus ([place]) 02/14/2018  . Hyponatremia 02/14/2018  . Medically noncompliant 02/14/2018  . S/P AKA (above knee amputation) bilateral ([place]) 02/12/2018  . Current smoker 02/03/2018  . Leukocytosis 02/03/2018  . Reactive thrombocytosis 02/03/2018  . Urinary incontinence 08/14/2017  . Transaminitis   . Type 2 diabetes mellitus with stage 4 chronic kidney disease, with  long-term current use of insulin ([place]) 07/21/2016  . Hyperuricemia 06/21/2016  . HTN (hypertension) 01/05/2016  . Diabetes type 2, uncontrolled ([place]) 01/05/2016  . Hypothyroidism 05/10/2011    Conditions to be addressed/monitored:HTN and DMII  Care Plan : RNCM: Diabetes Type 2 (Adult)  Updates made by Ilean China, RN since 10/19/2020 12:00 AM    Problem: Glycemic  Management (Diabetes, Type 2)   Priority: Medium    Long-Range Goal: Glycemic Management Optimized   This Visit's Progress: On track  Recent Progress: On track  Priority: Medium  Note:   Objective: Lab Results  Component Value Date   HGBA1C 6.0 (H) 08/06/2020   HGBA1C 6.5 01/27/2020   HGBA1C 6.8 09/30/2019   Lab Results  Component Value Date   LDLCALC 19 09/22/2020   CREATININE 1.16 09/22/2020   Current Barriers:  . Chronic Disease Management support and education needs related to diabetes . Unable to independently drive. Has to have assistance with some ADLs and IADLs.  Nurse Case Manager Clinical Goal(s):  . patient will work with endocrinologist to address needs related to diabetes . patient will meet with RN Care Manager to address self-management of diabetes . the patient will demonstrate ongoing self health care management ability as evidenced by maintaining an A1C within provider recommended range*  Interventions:  . 1:1 collaboration with [name], MD regarding development and update of comprehensive plan of care as evidenced by provider attestation and co-signature . Inter-disciplinary care team collaboration (see longitudinal plan of care) . Evaluation of current treatment plan related to diabetes and patient's adherence to plan as established by provider. . Chart reviewed including relevant office notes and lab results . Reviewed and discussed medications and encouraged compliance . Discussed home blood sugar testing . Advised to take blood sugar log to endocrinology appointments . Encouraged to continue testing and recording blood sugar 3 times a day and to call endocrinologist with any readings outside of recommended range . Reinforced need to eat meals at regular intervals and to focus on lean proteins and vegetables and to limit sugar and simple carbohydrates . Reviewed upcoming appointments . Provided with RNCM contact number and encouraged to reach out  as needed  Patient Goals/Self-Care Activities Over the next 60 days, patient will: . check blood sugar at prescribed times . check blood sugar if I feel it is too high or too low . enter blood sugar readings and medication or insulin into daily log . take the blood sugar log to all doctor visits . take the blood sugar meter to all doctor visits  . Eat meals at regular intervals and avoid simple carbohydrates and sugars . Patient should call endocrinologist if he has any blood sugar readings below 70 or above 200 . Keep all scheduled medical appointments    Follow Up Plan: . Telephone follow up appointment with care management team member scheduled for: RN Care Manager on 11/19/20 . The patient has been provided with contact information for the care management team and has been advised to call with any health related questions or concerns.  . Next PCP appointment scheduled for:  03/30/21 with Dr Livia Snellen . Next endocrinology appointment scheduled for: 02/16/21 with endocrinologist  Chong Sicilian, BSN, RN-BC [place] [facility] [place] Management Direct Dial: [phone]

## 2020-10-19 NOTE — Telephone Encounter (Signed)
Todd [name] made aware of all labs and medications changes.  Reviewed symptoms of elevated TSH and she will call back if she feels pts mood, orientation does not improve.  Labs mailed to [place] as requested.  [address]  [place], [address]

## 2020-10-19 NOTE — Telephone Encounter (Signed)
Pt sister [name] is calling and requesting nurse to call her back and discuss patient most recent labs. [phone]

## 2020-10-19 NOTE — Telephone Encounter (Signed)
Left message for [name] to return call.

## 2020-10-19 NOTE — Telephone Encounter (Signed)
Spoke with [place]. Answered her questions regarding pts labs, office visit.

## 2020-10-19 NOTE — Patient Instructions (Signed)
Visit Information  PATIENT GOALS: Goals Addressed            This Visit's Progress   . Monitor and Manage My Blood Sugar-Diabetes Type 2   On track    Timeframe:  Long-Range Goal Priority:  Medium Start Date:                             Expected End Date:                       Follow Up Date 11/19/20   . check blood sugar at prescribed times . check blood sugar if I feel it is too high or too low . enter blood sugar readings and medication or insulin into daily log . take the blood sugar log to all doctor visits . take the blood sugar meter to all doctor visits  . Eat meals at regular intervals and avoid simple carbohydrates and sugars . Patient should call endocrinologist if he has any blood sugar readings below 70 or above 200 . Keep all scheduled medical appointments  Why is this important?    Checking your blood sugar at home helps to keep it from getting very high or very low.   Writing the results in a diary or log helps the doctor know how to care for you.   Your blood sugar log should have the time, date and the results.   Also, write down the amount of insulin or other medicine that you take.   Other information, like what you ate, exercise done and how you were feeling, will also be helpful.     Notes:        Patient verbalizes understanding of instructions provided today and agrees to view in [place].    Follow Up Plan: . Telephone follow up appointment with care management team member scheduled for: RN Care Manager on 11/19/20 . The patient has been provided with contact information for the care management team and has been advised to call with any health related questions or concerns.  . Next PCP appointment scheduled for:  03/30/21 with Dr Livia Snellen . Next endocrinology appointment scheduled for: 02/16/21 with endocrinologist  Chong Sicilian, BSN, RN-BC [place] [facility] [place] Management Direct Dial:  [phone]

## 2020-10-27 ENCOUNTER — Telehealth: Payer: Medicare HMO

## 2020-11-19 ENCOUNTER — Ambulatory Visit: Payer: Medicare HMO | Admitting: Family Medicine

## 2020-11-19 ENCOUNTER — Telehealth: Payer: Self-pay | Admitting: Family Medicine

## 2020-11-19 ENCOUNTER — Telehealth: Payer: Medicare HMO | Admitting: *Deleted

## 2020-11-19 ENCOUNTER — Ambulatory Visit (INDEPENDENT_AMBULATORY_CARE_PROVIDER_SITE_OTHER): Payer: Medicare HMO | Admitting: Licensed Clinical Social Worker

## 2020-11-19 DIAGNOSIS — I10 Essential (primary) hypertension: Secondary | ICD-10-CM | POA: Diagnosis not present

## 2020-11-19 DIAGNOSIS — Z794 Long term (current) use of insulin: Secondary | ICD-10-CM | POA: Diagnosis not present

## 2020-11-19 DIAGNOSIS — E782 Mixed hyperlipidemia: Secondary | ICD-10-CM | POA: Diagnosis not present

## 2020-11-19 DIAGNOSIS — E1122 Type 2 diabetes mellitus with diabetic chronic kidney disease: Secondary | ICD-10-CM

## 2020-11-19 DIAGNOSIS — N184 Chronic kidney disease, stage 4 (severe): Secondary | ICD-10-CM | POA: Diagnosis not present

## 2020-11-19 DIAGNOSIS — E039 Hypothyroidism, unspecified: Secondary | ICD-10-CM | POA: Diagnosis not present

## 2020-11-19 DIAGNOSIS — N183 Chronic kidney disease, stage 3 unspecified: Secondary | ICD-10-CM | POA: Diagnosis not present

## 2020-11-19 DIAGNOSIS — Z89611 Acquired absence of right leg above knee: Secondary | ICD-10-CM

## 2020-11-19 NOTE — Chronic Care Management (AMB) (Signed)
Chronic Care Management    Clinical Social Work Note  11/19/2020 Name: Todd [name] MRN: [id number] DOB: 20-May-1956  Todd [name] is a 65 y.o. year old male who is a primary care patient of [name], [name], MD. The CCM team was consulted to assist the patient with chronic disease management and/or care coordination needs related to: [name] .   Engaged with patient/ sister of patient, [name],  by telephone for follow up visit in response to provider referral for social work chronic care management and care coordination services.   Consent to Services:  The patient was given information about Chronic Care Management services, agreed to services, and gave verbal consent prior to initiation of services.  Please see initial visit note for detailed documentation.   Patient agreed to services and consent obtained.   Assessment: Review of patient past medical history, allergies, medications, and health status, including review of relevant consultants reports was performed today as part of a comprehensive evaluation and provision of chronic care management and care coordination services.     SDOH (Social Determinants of Health) assessments and interventions performed:  SDOH Interventions    Flowsheet Row Most Recent Value  SDOH Interventions   Depression Interventions/Treatment  Medication        Advanced Directives Status: See Vynca application for related entries.  CCM Care Plan  Allergies  Allergen Reactions   Lisinopril Other (See Comments)    Hyperkalemia / Renal failure    Outpatient Encounter Medications as of 11/19/2020  Medication Sig   allopurinol (ZYLOPRIM) 100 MG tablet Take 1 tablet (100 mg total) by mouth daily.   aspirin EC 81 MG tablet Take 81 mg by mouth daily.   atorvastatin (LIPITOR) 40 MG tablet TAKE 1 TABLET BY MOUTH ONCE DAILY AT 6 PM   blood glucose meter kit and supplies Dispense based on patient and insurance preference. Use up to four  times daily as directed. (FOR ICD-10 E10.9, E11.9).   cetirizine (ZYRTEC) 10 MG tablet Take 1 tablet (10 mg total) by mouth daily. For allergy symptoms   folic acid (FOLVITE) 1 MG tablet Take 1 tablet (1 mg total) by mouth daily.   glucose blood (ACCU-CHEK AVIVA PLUS) test strip Check blood sugars four times a day   Hydroactive Dressings (TEGADERM HYDROCOLLOID THIN) MISC Apply 1 each topically 2 (two) times a week.   insulin glargine (LANTUS SOLOSTAR) 100 UNIT/ML Solostar Pen Inject 30 Units into the skin at bedtime.   Insulin Pen Needle 31G X 5 MM MISC 1 Device by Does not apply route as directed.   levothyroxine (SYNTHROID) 75 MCG tablet Take 1 tablet (75 mcg total) by mouth daily before breakfast.   megestrol (MEGACE) 400 MG/10ML suspension Take 10 mLs (400 mg total) by mouth 2 (two) times daily. For appetite stimulation   metoprolol succinate (TOPROL-XL) 100 MG 24 hr tablet TAKE 1 TABLET BY MOUTH ONCE DAILY WITH MEALS OR  IMMEDIATELY  FOLLOWING   sertraline (ZOLOFT) 50 MG tablet TAKE 1 TABLET BY MOUTH AT BEDTIME FOR ANXIETY AND FOR DEPRESSION   Vitamin D, Ergocalciferol, (DRISDOL) 1.25 MG (50000 UNIT) CAPS capsule Take 1 capsule by mouth once a week   No facility-administered encounter medications on file as of 11/19/2020.    Patient Active Problem List   Diagnosis Date Noted   Vitamin D deficiency 06/17/2019   Essential hypertension, benign 08/30/2018   Mixed hyperlipidemia 07/17/2018   CKD stage 3 due to type 2 diabetes mellitus ([place]) 02/14/2018   Hyponatremia  02/14/2018   Medically noncompliant 02/14/2018   S/P AKA (above knee amputation) bilateral ([place]) 02/12/2018   Current smoker 02/03/2018   Leukocytosis 02/03/2018   Reactive thrombocytosis 02/03/2018   Urinary incontinence 08/14/2017   Transaminitis    Type 2 diabetes mellitus with stage 4 chronic kidney disease, with long-term current use of insulin ([place]) 07/21/2016   Hyperuricemia 06/21/2016   HTN (hypertension) 01/05/2016    Diabetes type 2, uncontrolled ([place]) 01/05/2016   Hypothyroidism 05/10/2011    Conditions to be addressed/monitored: Monitor client management of grief issues faced  Care Plan : LCSW Care plan  Updates made by Katha Cabal, LCSW since 11/19/2020 12:00 AM     Problem: Emotional Distress      Goal: manage grief issues faced   Start Date: 11/19/2020  Expected End Date: 02/19/2021  This Visit's Progress: On track  Recent Progress: On track  Priority: Medium  Note:   Current Barriers:  Mobility issues Pain issues Needs help with ADLs Suicidal Ideation/Homicidal Ideation: No  Clinical Social Work Goal(s):  patient will work with SW monthly by telephone or in person to reduce or manage symptoms related to grief issues faced patient will work with SW monthly to address concerns related to mobility issues of client  Interventions: 1:1 collaboration with [name], MD regarding development and update of comprehensive plan of care as evidenced by provider attestation and co-signature Talked with [name], sister of client, about current client needs Talked with [name] about skin care issues of client [name] said client has skin breakdown on his buttocks) Talked with [name] about fact that client does have a wheelchair cushion Talked with [name] about appetite of client Talked with [name] about medication procurement of client Talked with [name] about in home care giver of client Talked with [name], sister of client , about client completion of ADLs Talked with [name] about socialization of client Talked with [name] about medication procurement of client  Encouraged [name] or client to call RNCM as needed for nursing support Talked with client about family support Talked with [name] about ADTS in home support services related to needs of client Talked with [name] about RNCM support with CCM services Talked with [name] about [facility] dine program support for  client Encouraged [name] or her sister to call [facility] Triage Nurse as needed for nursing support for client  Talked with [name] about social isolation possibly of [place]. [name] and [name] think [place] has decreased upper body strength. They think this strength has decreased tremendously. [name] mentioned his use of transfer board and that it may be getting harder for him to use transfer board)   Patient Self Care Activities:  Self administers medications as prescribed Attends all scheduled provider appointments  Patient Coping Strengths:  Family Friends  Patient Self Care Deficits:  Mobility issues  Patient Goals:  - spend time or talk with others at least 2 to 3 times per week - practice relaxation or meditation daily - keep a calendar with appointment dates  Follow Up Plan: LCSW to call client or sister of client on 01/01/21      Graylon Amory.Hanae Waiters MSW, LCSW Licensed Clinical Social Worker [facility] Care Management [phone]

## 2020-11-19 NOTE — Telephone Encounter (Signed)
Called sister, no answer, left message to return call  Patient needs face to face visit to order home health. Patient is scheduled to see a provider tomorrow for rash. Added note to eval for home health as well.

## 2020-11-19 NOTE — Patient Instructions (Signed)
Visit Information  PATIENT GOALS:  Goals Addressed             This Visit's Progress    manage grief issues faced       Timeframe:  Short-Term Goal Priority:  Medium Progress: On Track Start Date:        11/19/20                     Expected End Date:      02/19/21                 Follow Up Date  01/01/21   Manage My Emotions (Patient) Manage Grief issues faced    Why is this important?   When you are stressed, down or upset, your body reacts too.  For example, your blood pressure may get higher; you may have a headache or stomachache.  When your emotions get the best of you, your body's ability to fight off cold and flu gets weak.  These steps will help you manage your emotions.     Patient Self Care Activities:  Self administers medications as prescribed Attends all scheduled provider appointments  Patient Coping Strengths:  Family Friends  Patient Self Care Deficits:  Mobility issues  Patient Goals:  - spend time or talk with others at least 2 to 3 times per week - practice relaxation or meditation daily - keep a calendar with appointment dates  Follow Up Plan: LCSW to call client on 01/01/21      Pope Brunty.Izik Bingman MSW, LCSW Licensed Clinical Social Worker [facility] Care Management [phone]

## 2020-11-20 ENCOUNTER — Ambulatory Visit: Payer: Medicare HMO | Admitting: Nurse Practitioner

## 2020-11-23 ENCOUNTER — Encounter: Payer: Self-pay | Admitting: Family Medicine

## 2020-11-24 ENCOUNTER — Telehealth: Payer: Self-pay | Admitting: Family Medicine

## 2020-11-24 ENCOUNTER — Ambulatory Visit: Payer: Medicare HMO | Admitting: Family Medicine

## 2020-11-24 NOTE — Telephone Encounter (Signed)
PATIENT HAS APPOINTMENT WITH [facility] NEXT WEEK

## 2020-11-24 NOTE — Telephone Encounter (Signed)
Patient has an appointment next week with [facility]

## 2020-11-25 ENCOUNTER — Encounter: Payer: Self-pay | Admitting: Family Medicine

## 2020-12-01 ENCOUNTER — Ambulatory Visit: Payer: Medicare HMO | Admitting: Nurse Practitioner

## 2020-12-02 ENCOUNTER — Telehealth: Payer: Medicare HMO | Admitting: *Deleted

## 2020-12-04 ENCOUNTER — Observation Stay (HOSPITAL_COMMUNITY)
Admission: EM | Admit: 2020-12-04 | Discharge: 2020-12-06 | Disposition: A | Discharge status: Home / Self Care | Payer: Medicare HMO | Attending: Family Medicine | Admitting: Family Medicine

## 2020-12-04 ENCOUNTER — Emergency Department (HOSPITAL_COMMUNITY): Payer: Medicare HMO

## 2020-12-04 ENCOUNTER — Encounter: Payer: Self-pay | Admitting: Family Medicine

## 2020-12-04 DIAGNOSIS — F17210 Nicotine dependence, cigarettes, uncomplicated: Secondary | ICD-10-CM | POA: Diagnosis not present

## 2020-12-04 DIAGNOSIS — E162 Hypoglycemia, unspecified: Secondary | ICD-10-CM

## 2020-12-04 DIAGNOSIS — Z7982 Long term (current) use of aspirin: Secondary | ICD-10-CM | POA: Diagnosis not present

## 2020-12-04 DIAGNOSIS — Z20822 Contact with and (suspected) exposure to covid-19: Secondary | ICD-10-CM | POA: Diagnosis not present

## 2020-12-04 DIAGNOSIS — N179 Acute kidney failure, unspecified: Principal | ICD-10-CM | POA: Diagnosis present

## 2020-12-04 DIAGNOSIS — Z79899 Other long term (current) drug therapy: Secondary | ICD-10-CM | POA: Diagnosis not present

## 2020-12-04 DIAGNOSIS — E039 Hypothyroidism, unspecified: Secondary | ICD-10-CM | POA: Diagnosis not present

## 2020-12-04 DIAGNOSIS — L89156 Pressure-induced deep tissue damage of sacral region: Secondary | ICD-10-CM

## 2020-12-04 DIAGNOSIS — E785 Hyperlipidemia, unspecified: Secondary | ICD-10-CM | POA: Diagnosis present

## 2020-12-04 DIAGNOSIS — E782 Mixed hyperlipidemia: Secondary | ICD-10-CM | POA: Diagnosis present

## 2020-12-04 DIAGNOSIS — G9341 Metabolic encephalopathy: Secondary | ICD-10-CM | POA: Diagnosis not present

## 2020-12-04 DIAGNOSIS — I10 Essential (primary) hypertension: Secondary | ICD-10-CM | POA: Diagnosis not present

## 2020-12-04 DIAGNOSIS — I129 Hypertensive chronic kidney disease with stage 1 through stage 4 chronic kidney disease, or unspecified chronic kidney disease: Secondary | ICD-10-CM | POA: Diagnosis not present

## 2020-12-04 DIAGNOSIS — Z794 Long term (current) use of insulin: Secondary | ICD-10-CM | POA: Diagnosis not present

## 2020-12-04 DIAGNOSIS — N184 Chronic kidney disease, stage 4 (severe): Secondary | ICD-10-CM | POA: Diagnosis not present

## 2020-12-04 DIAGNOSIS — R0902 Hypoxemia: Secondary | ICD-10-CM | POA: Diagnosis not present

## 2020-12-04 DIAGNOSIS — E11649 Type 2 diabetes mellitus with hypoglycemia without coma: Secondary | ICD-10-CM | POA: Diagnosis not present

## 2020-12-04 DIAGNOSIS — F17200 Nicotine dependence, unspecified, uncomplicated: Secondary | ICD-10-CM | POA: Diagnosis present

## 2020-12-04 DIAGNOSIS — E1165 Type 2 diabetes mellitus with hyperglycemia: Secondary | ICD-10-CM | POA: Diagnosis present

## 2020-12-04 DIAGNOSIS — E871 Hypo-osmolality and hyponatremia: Secondary | ICD-10-CM | POA: Diagnosis not present

## 2020-12-04 DIAGNOSIS — R531 Weakness: Secondary | ICD-10-CM | POA: Diagnosis not present

## 2020-12-04 DIAGNOSIS — IMO0002 Reserved for concepts with insufficient information to code with codable children: Secondary | ICD-10-CM | POA: Diagnosis present

## 2020-12-04 DIAGNOSIS — F329 Major depressive disorder, single episode, unspecified: Secondary | ICD-10-CM

## 2020-12-04 DIAGNOSIS — R55 Syncope and collapse: Secondary | ICD-10-CM | POA: Diagnosis not present

## 2020-12-04 LAB — CBC WITH DIFFERENTIAL/PLATELET
Abs Immature Granulocytes: 0.08 10*3/uL — ABNORMAL HIGH (ref 0.00–0.07)
Basophils Absolute: 0 10*3/uL (ref 0.0–0.1)
Basophils Relative: 0 %
Eosinophils Absolute: 0 10*3/uL (ref 0.0–0.5)
Eosinophils Relative: 0 %
HCT: 32 % — ABNORMAL LOW (ref 39.0–52.0)
Hemoglobin: 10.2 g/dL — ABNORMAL LOW (ref 13.0–17.0)
Immature Granulocytes: 1 %
Lymphocytes Relative: 21 %
Lymphs Abs: 2 10*3/uL (ref 0.7–4.0)
MCH: 27.2 pg (ref 26.0–34.0)
MCHC: 31.9 g/dL (ref 30.0–36.0)
MCV: 85.3 fL (ref 80.0–100.0)
Monocytes Absolute: 0.5 10*3/uL (ref 0.1–1.0)
Monocytes Relative: 5 %
Neutro Abs: 7 10*3/uL (ref 1.7–7.7)
Neutrophils Relative %: 73 %
Platelets: 380 10*3/uL (ref 150–400)
RBC: 3.75 MIL/uL — ABNORMAL LOW (ref 4.22–5.81)
RDW: 15.3 % (ref 11.5–15.5)
WBC: 9.6 10*3/uL (ref 4.0–10.5)
nRBC: 0 % (ref 0.0–0.2)

## 2020-12-04 LAB — CBG MONITORING, ED
Glucose-Capillary: 105 mg/dL — ABNORMAL HIGH (ref 70–99)
Glucose-Capillary: 27 mg/dL — CL (ref 70–99)
Glucose-Capillary: 75 mg/dL (ref 70–99)
Glucose-Capillary: 83 mg/dL (ref 70–99)
Glucose-Capillary: 95 mg/dL (ref 70–99)

## 2020-12-04 LAB — BASIC METABOLIC PANEL
Anion gap: 12 (ref 5–15)
BUN: 28 mg/dL — ABNORMAL HIGH (ref 8–23)
CO2: 18 mmol/L — ABNORMAL LOW (ref 22–32)
Calcium: 8.3 mg/dL — ABNORMAL LOW (ref 8.9–10.3)
Chloride: 103 mmol/L (ref 98–111)
Creatinine, Ser: 2.08 mg/dL — ABNORMAL HIGH (ref 0.61–1.24)
GFR, Estimated: 35 mL/min — ABNORMAL LOW (ref >60)
Glucose, Bld: 132 mg/dL — ABNORMAL HIGH (ref 70–99)
Potassium: 3.7 mmol/L (ref 3.5–5.1)
Sodium: 133 mmol/L — ABNORMAL LOW (ref 135–145)

## 2020-12-04 LAB — MAGNESIUM: Magnesium: 1.7 mg/dL (ref 1.7–2.4)

## 2020-12-04 LAB — TROPONIN I (HIGH SENSITIVITY)
Troponin I (High Sensitivity): 10 ng/L (ref <18)
Troponin I (High Sensitivity): 11 ng/L (ref <18)

## 2020-12-04 LAB — RESP PANEL BY RT-PCR (FLU A&B, COVID) ARPGX2
Influenza A by PCR: NEGATIVE
Influenza B by PCR: NEGATIVE
SARS Coronavirus 2 by RT PCR: NEGATIVE

## 2020-12-04 MED ORDER — SODIUM CHLORIDE 0.9% FLUSH: 3.0000 mL | Freq: Two times a day (BID) | INTRAVENOUS | Status: DC

## 2020-12-04 MED ORDER — SODIUM CHLORIDE 0.9 % IV SOLN: 250.0000 mL | INTRAVENOUS | Status: DC | PRN

## 2020-12-04 MED ORDER — SODIUM CHLORIDE 0.9% FLUSH: 3.0000 mL | INTRAVENOUS | Status: DC | PRN

## 2020-12-04 MED ORDER — SODIUM CHLORIDE 0.9 % IV BOLUS
1000.0000 mL | Freq: Once | INTRAVENOUS | Status: AC
  Administered 2020-12-04: 1000 mL via INTRAVENOUS

## 2020-12-04 NOTE — ED Provider Notes (Signed)
[facility] EMERGENCY DEPARTMENT Provider Note   CSN: [id number] Arrival date & time: 12/04/20  1921     History Chief Complaint  Patient presents with   Weakness    Todd [name] is a 65 y.o. male with a history of diabetes, right BKA, presenting to the emergency department generalized weakness.  The patient reports that he "fell out a little from his wheelchair".  He denies loss of consciousness.  He says he feels generally weak.  He denies fevers, chills, cough, congestion, dysuria.  He denies headache, chest pain, chest pressure.  He does have a chronic soreness but which is changed by health aide with clean dressings.  He has no other active symptoms my exam.  HPI     Past Medical History:  Diagnosis Date   AKI (acute kidney injury) ([place])    Constipated    Diabetes mellitus without complication ([place])    Diarrhea    Elevated LFTs    Goiter    Gout    Hyperlipidemia    Hypertension    Leukocytosis    Reactive thrombocytosis    Right BKA infection ([place]) 08/2016   Right leg pain    Sepsis due to undetermined organism [facility])    Thyroid disease    Wound infection after surgery 08/2016    Patient Active Problem List   Diagnosis Date Noted   Acute metabolic encephalopathy 31/49/7026   Hypoglycemia 12/05/2020   AKI (acute kidney injury) ([place]) 12/04/2020   Vitamin D deficiency 06/17/2019   Essential hypertension, benign 08/30/2018   Mixed hyperlipidemia 07/17/2018   CKD stage 3 due to type 2 diabetes mellitus ([place]) 02/14/2018   Hyponatremia 02/14/2018   Medically noncompliant 02/14/2018   S/P AKA (above knee amputation) bilateral ([place]) 02/12/2018   Current smoker 02/03/2018   Leukocytosis 02/03/2018   Reactive thrombocytosis 02/03/2018   Urinary incontinence 08/14/2017   Transaminitis    Type 2 diabetes mellitus with stage 4 chronic kidney disease, with long-term current use of insulin ([place]) 07/21/2016   Hyperuricemia 06/21/2016   HTN (hypertension) 01/05/2016    Diabetes type 2, uncontrolled ([place]) 01/05/2016   Hypothyroidism 05/10/2011    Past Surgical History:  Procedure Laterality Date   ABDOMINAL AORTOGRAM N/A 08/11/2016   Procedure: Abdominal Aortogram;  Surgeon: Waynetta Sandy, MD;  Location: [place] [facility];  Service: Cardiovascular;  Laterality: N/A;   ABDOMINAL AORTOGRAM W/LOWER EXTREMITY N/A 08/15/2016   Procedure: Abdominal Aortogram w/Lower Extremity;  Surgeon: Elam Dutch, MD;  Location: [place] [facility];  Service: Cardiovascular;  Laterality: N/A;   AMPUTATION Right 08/17/2016   Procedure: RIGHT BELOW KNEE AMPUTATION;  Surgeon: Elam Dutch, MD;  Location: [place];  Service: Vascular;  Laterality: Right;   AMPUTATION Right 09/12/2016   Procedure: AMPUTATION ABOVE KNEE;  Surgeon: Newt Minion, MD;  Location: [place];  Service: Orthopedics;  Laterality: Right;   AMPUTATION Left 08/12/2016   Procedure: LEFT BELOW KNEE AMPUTATION;  Surgeon: Newt Minion, MD;  Location: [place];  Service: Orthopedics;  Laterality: Left;   AMPUTATION Left 11/01/2017   Procedure: LEFT ABOVE KNEE AMPUTATION;  Surgeon: Newt Minion, MD;  Location: [place];  Service: Orthopedics;  Laterality: Left;   APPLICATION OF WOUND VAC Right 09/12/2016   Procedure: APPLICATION OF WOUND VAC ABOVE KNEE;  Surgeon: Newt Minion, MD;  Location: [place];  Service: Orthopedics;  Laterality: Right;   LOWER EXTREMITY ANGIOGRAPHY Bilateral 08/11/2016   Procedure: Lower Extremity Angiography;  Surgeon: Georgia Dom  Donzetta Matters, MD;  Location: [place] [facility];  Service: Cardiovascular;  Laterality: Bilateral;   PERIPHERAL VASCULAR BALLOON ANGIOPLASTY Left 08/11/2016   Procedure: Peripheral Vascular Balloon Angioplasty;  Surgeon: Waynetta Sandy, MD;  Location: [place] [facility];  Service: Cardiovascular;  Laterality: Left;  SFA   THYROID SURGERY         Family History  Problem Relation Age of Onset   Heart disease Mother    Pneumonia Father    Diabetes  Maternal Aunt    Diabetes Maternal Uncle     Social History   Tobacco Use   Smoking status: Every Day    Packs/day: 0.25    Years: 45.00    Pack years: 11.25    Types: Cigarettes    Last attempt to quit: 11/12/2014    Years since quitting: 6.0   Smokeless tobacco: Never  Vaping Use   Vaping Use: Never used  Substance Use Topics   Alcohol use: Not Currently    Alcohol/week: 0.0 standard drinks   Drug use: No    Home Medications Prior to Admission medications   Medication Sig Start Date End Date Taking? Authorizing Provider  allopurinol (ZYLOPRIM) 100 MG tablet Take 1 tablet (100 mg total) by mouth daily. 09/28/20   [name], MD  aspirin EC 81 MG tablet Take 81 mg by mouth daily.    [provider]  atorvastatin (LIPITOR) 40 MG tablet TAKE 1 TABLET BY MOUTH ONCE DAILY AT 6 PM 07/27/20   [name], MD  blood glucose meter kit and supplies Dispense based on patient and insurance preference. Use up to four times daily as directed. (FOR ICD-10 E10.9, E11.9). 02/05/18   Johnson, Clanford L, MD  cetirizine (ZYRTEC) 10 MG tablet Take 1 tablet (10 mg total) by mouth daily. For allergy symptoms 07/27/20   [name], MD  folic acid (FOLVITE) 1 MG tablet Take 1 tablet (1 mg total) by mouth daily. 02/06/18   Johnson, Clanford L, MD  glucose blood (ACCU-CHEK AVIVA PLUS) test strip Check blood sugars four times a day 03/12/18   [name], MD  Hydroactive Dressings (TEGADERM HYDROCOLLOID THIN) MISC Apply 1 each topically 2 (two) times a week. 02/20/20   [name], MD  insulin glargine (LANTUS SOLOSTAR) 100 UNIT/ML Solostar Pen Inject 30 Units into the skin at bedtime. 08/13/20   Brita Romp, NP  Insulin Pen Needle 31G X 5 MM MISC 1 Device by Does not apply route as directed. 02/05/18   Murlean Iba, MD  levothyroxine (SYNTHROID) 75 MCG tablet Take 1 tablet (75 mcg total) by mouth daily before breakfast. 09/28/20   [name], MD  megestrol (MEGACE) 400  MG/10ML suspension Take 10 mLs (400 mg total) by mouth 2 (two) times daily. For appetite stimulation 07/27/20   [name], MD  metoprolol succinate (TOPROL-XL) 100 MG 24 hr tablet TAKE 1 TABLET BY MOUTH ONCE DAILY WITH MEALS OR  IMMEDIATELY  FOLLOWING 07/27/20   [name], MD  sertraline (ZOLOFT) 50 MG tablet TAKE 1 TABLET BY MOUTH AT BEDTIME FOR ANXIETY AND FOR DEPRESSION 09/08/20   [name], MD  Vitamin D, Ergocalciferol, (DRISDOL) 1.25 MG (50000 UNIT) CAPS capsule Take 1 capsule by mouth once a week 10/09/19   Cassandria Anger, MD    Allergies    Lisinopril  Review of Systems   Review of Systems  Constitutional:  Positive for fatigue. Negative for chills and fever.  Eyes:  Negative for pain and visual disturbance.  Respiratory:  Negative for cough and shortness of breath.   Cardiovascular:  Negative for chest pain and palpitations.  Gastrointestinal:  Negative for abdominal pain and vomiting.  Musculoskeletal:  Negative for arthralgias and back pain.  Skin:  Negative for color change and rash.  Neurological:  Negative for syncope and headaches.  All other systems reviewed and are negative.  Physical Exam Updated Vital Signs BP 126/75 (BP Location: Left Arm)   Pulse 67   Temp 98 F (36.7 C) (Oral)   Resp 15   Wt 55.7 kg   SpO2 97%   BMI 18.67 kg/m   Physical Exam Constitutional:      General: He is not in acute distress. HENT:     Head: Normocephalic and atraumatic.  Eyes:     Conjunctiva/sclera: Conjunctivae normal.     Pupils: Pupils are equal, round, and reactive to light.  Cardiovascular:     Rate and Rhythm: Normal rate and regular rhythm.  Pulmonary:     Effort: Pulmonary effort is normal. No respiratory distress.  Abdominal:     General: There is no distension.     Tenderness: There is no abdominal tenderness.  Musculoskeletal:     Comments: BKA   Skin:    General: Skin is warm and dry.     Comments: Stage 2 ulceration on left buttock,  clean, nonpurulent  Neurological:     General: No focal deficit present.     Mental Status: He is alert and oriented to person, place, and time. Mental status is at baseline.  Psychiatric:        Mood and Affect: Mood normal.        Behavior: Behavior normal.    ED Results / Procedures / Treatments   Labs (all labs ordered are listed, but only abnormal results are displayed) Labs Reviewed  MRSA NEXT GEN BY PCR, NASAL - Abnormal; Notable for the following components:      Result Value   MRSA by PCR Next Gen DETECTED (*)    All other components within normal limits  BASIC METABOLIC PANEL - Abnormal; Notable for the following components:   Sodium 133 (*)    CO2 18 (*)    Glucose, Bld 132 (*)    BUN 28 (*)    Creatinine, Ser 2.08 (*)    Calcium 8.3 (*)    GFR, Estimated 35 (*)    All other components within normal limits  CBC WITH DIFFERENTIAL/PLATELET - Abnormal; Notable for the following components:   RBC 3.75 (*)    Hemoglobin 10.2 (*)    HCT 32.0 (*)    Abs Immature Granulocytes 0.08 (*)    All other components within normal limits  URINALYSIS, ROUTINE W REFLEX MICROSCOPIC - Abnormal; Notable for the following components:   Color, Urine AMBER (*)    Hgb urine dipstick SMALL (*)    Bilirubin Urine SMALL (*)    Ketones, ur 5 (*)    Leukocytes,Ua MODERATE (*)    WBC, UA >50 (*)    Bacteria, UA RARE (*)    All other components within normal limits  TSH - Abnormal; Notable for the following components:   TSH 28.476 (*)    All other components within normal limits  COMPREHENSIVE METABOLIC PANEL - Abnormal; Notable for the following components:   Potassium 3.1 (*)    CO2 17 (*)    BUN 27 (*)    Creatinine, Ser 1.72 (*)    Calcium 7.8 (*)  Albumin 2.1 (*)    AST 14 (*)    Total Bilirubin 2.1 (*)    GFR, Estimated 44 (*)    All other components within normal limits  CBC WITH DIFFERENTIAL/PLATELET - Abnormal; Notable for the following components:   RBC 3.84 (*)     Hemoglobin 10.3 (*)    HCT 32.8 (*)    Abs Immature Granulocytes 0.08 (*)    All other components within normal limits  RAPID URINE DRUG SCREEN, HOSP PERFORMED - Abnormal; Notable for the following components:   Cocaine POSITIVE (*)    All other components within normal limits  CBG MONITORING, ED - Abnormal; Notable for the following components:   Glucose-Capillary 27 (*)    All other components within normal limits  CBG MONITORING, ED - Abnormal; Notable for the following components:   Glucose-Capillary 105 (*)    All other components within normal limits  RESP PANEL BY RT-PCR (FLU A&B, COVID) ARPGX2  MAGNESIUM  GLUCOSE, CAPILLARY  GLUCOSE, CAPILLARY  HEMOGLOBIN A1C  HIV ANTIBODY (ROUTINE TESTING W REFLEX)  CORTISOL-AM, BLOOD  T4, FREE  CBG MONITORING, ED  CBG MONITORING, ED  CBG MONITORING, ED  TROPONIN I (HIGH SENSITIVITY)  TROPONIN I (HIGH SENSITIVITY)    EKG EKG Interpretation  Date/Time:  Friday December 04 2020 19:48:36 EDT Ventricular Rate:  78 PR Interval:  153 QRS Duration: 102 QT Interval:  443 QTC Calculation: 505 R Axis:   -10 Text Interpretation: Sinus rhythm Paired ventricular premature complexes No significant change from Sept 2 2019 ecg, no STEMI Prolonged QT interval Confirmed by Octaviano Glow [phone]) on 12/04/2020 8:29:13 PM  Radiology DG Chest Portable 1 View  Result Date: 12/04/2020 CLINICAL DATA:  Infection, near syncope with generalized weakness. EXAM: PORTABLE CHEST 1 VIEW COMPARISON:  February 04, 2018. FINDINGS: EKG leads projecting over the chest. Cardiomediastinal contours and hilar structures are stable. Calcified atheromatous plaque of the thoracic aorta. Signs of thyroidectomy at the thoracic inlet. Lungs are clear. No signs of pneumothorax.  No effusion. On limited assessment no acute skeletal process. IMPRESSION: No acute cardiopulmonary disease. Electronically Signed   By: Zetta Bills M.D.   On: 12/04/2020 21:18    Procedures Procedures    Medications Ordered in ED Medications  0.9 %  sodium chloride infusion (has no administration in time range)  aspirin EC tablet 81 mg (81 mg Oral Given 12/05/20 0954)  atorvastatin (LIPITOR) tablet 40 mg (has no administration in time range)  metoprolol succinate (TOPROL-XL) 24 hr tablet 100 mg (100 mg Oral Given 12/05/20 0818)  sertraline (ZOLOFT) tablet 50 mg (50 mg Oral Not Given 12/05/20 0209)  levothyroxine (SYNTHROID) tablet 75 mcg (75 mcg Oral Given 12/05/20 0638)  insulin aspart (novoLOG) injection 0-9 Units (0 Units Subcutaneous Not Given 12/05/20 0815)  insulin aspart (novoLOG) injection 0-5 Units (0 Units Subcutaneous Not Given 12/05/20 0208)  heparin injection 5,000 Units (5,000 Units Subcutaneous Given 12/05/20 0638)  0.9 %  sodium chloride infusion ( Intravenous Rate/Dose Change 12/05/20 0819)  acetaminophen (TYLENOL) tablet 650 mg (has no administration in time range)    Or  acetaminophen (TYLENOL) suppository 650 mg (has no administration in time range)  oxyCODONE (Oxy IR/ROXICODONE) immediate release tablet 5 mg (has no administration in time range)  ondansetron (ZOFRAN) tablet 4 mg ( Oral See Alternative 12/05/20 0435)    Or  ondansetron (ZOFRAN) injection 4 mg (4 mg Intravenous Given 12/05/20 0435)  polyethylene glycol (MIRALAX / GLYCOLAX) packet 17 g (has no administration in time  range)  feeding supplement (ENSURE ENLIVE / ENSURE PLUS) liquid 237 mL (237 mLs Oral Given 12/05/20 0959)  potassium chloride 10 mEq in 100 mL IVPB (10 mEq Intravenous New Bag/Given 12/05/20 1110)  sodium chloride 0.9 % bolus 1,000 mL (1,000 mLs Intravenous New Bag/Given 12/04/20 2053)  magnesium sulfate IVPB 2 g 50 mL (2 g Intravenous New Bag/Given 12/05/20 2023)    ED Course  I have reviewed the triage vital signs and the nursing notes.  Pertinent labs & imaging results that were available during my care of the patient were reviewed by me and considered in my medical decision making (see chart for  details).  65 yo male here with weakness Ddx includes hypoglycemia vs dehydration vs anemia vs ACS vs other  Glucose low on arrival - improved with food and juice Hx supplemented by his sister who feels he is not eating or drinking anything at home.  He's also not taking any of his diabetes medications, has no current PCP (was let go because he missed too many appointments).  Labs here are showing an AKI, likely prerenal due to dehydration.   UA is pending Covid/flu is negative. Trop 10 -> 11.  ECG reviewed and nonischemic.  I doubt this is ACS.   DG chest reviewed - no acute findings  IV fluids ordered for AKI.  Glucose has been stable in the ED since his arrival.  Clinical Course as of 12/05/20 1116  Fri Dec 04, 2020  2301 BMP does show an AKI with doubling of creatinine.  I discussed the case with the patient and his sister.  He appears that he has been eating and drinking very little recently.  This may be related to poor p.o. intake.  We we will admit him to observation to the hospitalist for IV fluids and recheck of his creatinine.  We will add on an A1c.  He may need a medicine reconciliation and we prescription at the time of discharge.  They verbalized understanding. [MT]    Clinical Course User Index [MT] Ariatna Jester, Carola Rhine, MD    Final Clinical Impression(s) / ED Diagnoses Final diagnoses:  AKI (acute kidney injury) ([place])  Hypoglycemia    Rx / DC Orders ED Discharge Orders     None        Wyvonnia Dusky, MD 12/05/20 1116

## 2020-12-04 NOTE — ED Triage Notes (Signed)
Pt BIB [facility] for generalized weakness and sore to buttock that he says comes and goes.

## 2020-12-05 ENCOUNTER — Encounter (HOSPITAL_COMMUNITY): Payer: Self-pay | Admitting: Family Medicine

## 2020-12-05 DIAGNOSIS — E162 Hypoglycemia, unspecified: Secondary | ICD-10-CM

## 2020-12-05 DIAGNOSIS — E782 Mixed hyperlipidemia: Secondary | ICD-10-CM

## 2020-12-05 DIAGNOSIS — N179 Acute kidney failure, unspecified: Secondary | ICD-10-CM | POA: Diagnosis not present

## 2020-12-05 DIAGNOSIS — E1165 Type 2 diabetes mellitus with hyperglycemia: Secondary | ICD-10-CM

## 2020-12-05 DIAGNOSIS — G9341 Metabolic encephalopathy: Secondary | ICD-10-CM | POA: Diagnosis present

## 2020-12-05 DIAGNOSIS — F17200 Nicotine dependence, unspecified, uncomplicated: Secondary | ICD-10-CM

## 2020-12-05 DIAGNOSIS — E039 Hypothyroidism, unspecified: Secondary | ICD-10-CM

## 2020-12-05 LAB — CBC WITH DIFFERENTIAL/PLATELET
Abs Immature Granulocytes: 0.08 10*3/uL — ABNORMAL HIGH (ref 0.00–0.07)
Basophils Absolute: 0 10*3/uL (ref 0.0–0.1)
Basophils Relative: 0 %
Eosinophils Absolute: 0 10*3/uL (ref 0.0–0.5)
Eosinophils Relative: 0 %
HCT: 32.8 % — ABNORMAL LOW (ref 39.0–52.0)
Hemoglobin: 10.3 g/dL — ABNORMAL LOW (ref 13.0–17.0)
Immature Granulocytes: 1 %
Lymphocytes Relative: 23 %
Lymphs Abs: 2.1 10*3/uL (ref 0.7–4.0)
MCH: 26.8 pg (ref 26.0–34.0)
MCHC: 31.4 g/dL (ref 30.0–36.0)
MCV: 85.4 fL (ref 80.0–100.0)
Monocytes Absolute: 0.5 10*3/uL (ref 0.1–1.0)
Monocytes Relative: 5 %
Neutro Abs: 6.3 10*3/uL (ref 1.7–7.7)
Neutrophils Relative %: 71 %
Platelets: 368 10*3/uL (ref 150–400)
RBC: 3.84 MIL/uL — ABNORMAL LOW (ref 4.22–5.81)
RDW: 15.4 % (ref 11.5–15.5)
WBC: 8.9 10*3/uL (ref 4.0–10.5)
nRBC: 0 % (ref 0.0–0.2)

## 2020-12-05 LAB — COMPREHENSIVE METABOLIC PANEL
ALT: 8 U/L (ref 0–44)
AST: 14 U/L — ABNORMAL LOW (ref 15–41)
Albumin: 2.1 g/dL — ABNORMAL LOW (ref 3.5–5.0)
Alkaline Phosphatase: 70 U/L (ref 38–126)
Anion gap: 10 (ref 5–15)
BUN: 27 mg/dL — ABNORMAL HIGH (ref 8–23)
CO2: 17 mmol/L — ABNORMAL LOW (ref 22–32)
Calcium: 7.8 mg/dL — ABNORMAL LOW (ref 8.9–10.3)
Chloride: 108 mmol/L (ref 98–111)
Creatinine, Ser: 1.72 mg/dL — ABNORMAL HIGH (ref 0.61–1.24)
GFR, Estimated: 44 mL/min — ABNORMAL LOW (ref >60)
Glucose, Bld: 94 mg/dL (ref 70–99)
Potassium: 3.1 mmol/L — ABNORMAL LOW (ref 3.5–5.1)
Sodium: 135 mmol/L (ref 135–145)
Total Bilirubin: 2.1 mg/dL — ABNORMAL HIGH (ref 0.3–1.2)
Total Protein: 7 g/dL (ref 6.5–8.1)

## 2020-12-05 LAB — URINALYSIS, ROUTINE W REFLEX MICROSCOPIC
Glucose, UA: NEGATIVE mg/dL
Ketones, ur: 5 mg/dL — AB
Nitrite: NEGATIVE
Protein, ur: NEGATIVE mg/dL
Specific Gravity, Urine: 1.015 (ref 1.005–1.030)
WBC, UA: >50 WBC/hpf — ABNORMAL HIGH (ref 0–5)
pH: 5 (ref 5.0–8.0)

## 2020-12-05 LAB — RAPID URINE DRUG SCREEN, HOSP PERFORMED
Amphetamines: NOT DETECTED
Barbiturates: NOT DETECTED
Benzodiazepines: NOT DETECTED
Cocaine: POSITIVE — AB
Opiates: NOT DETECTED
Tetrahydrocannabinol: NOT DETECTED

## 2020-12-05 LAB — GLUCOSE, CAPILLARY
Glucose-Capillary: 85 mg/dL (ref 70–99)
Glucose-Capillary: 96 mg/dL (ref 70–99)
Glucose-Capillary: 105 mg/dL — ABNORMAL HIGH (ref 70–99)
Glucose-Capillary: 86 mg/dL (ref 70–99)
Glucose-Capillary: 76 mg/dL (ref 70–99)

## 2020-12-05 LAB — TSH: TSH: 28.476 u[IU]/mL — ABNORMAL HIGH (ref 0.350–4.500)

## 2020-12-05 LAB — HEMOGLOBIN A1C
Hgb A1c MFr Bld: 6 % — ABNORMAL HIGH (ref 4.8–5.6)
Mean Plasma Glucose: 125.5 mg/dL

## 2020-12-05 LAB — CORTISOL-AM, BLOOD: Cortisol - AM: 17 ug/dL (ref 6.7–22.6)

## 2020-12-05 LAB — T4, FREE: Free T4: 0.69 ng/dL (ref 0.61–1.12)

## 2020-12-05 LAB — MRSA NEXT GEN BY PCR, NASAL: MRSA by PCR Next Gen: DETECTED — AB

## 2020-12-05 LAB — HIV ANTIBODY (ROUTINE TESTING W REFLEX): HIV Screen 4th Generation wRfx: NONREACTIVE

## 2020-12-05 MED ORDER — MEGESTROL ACETATE 400 MG/10ML PO SUSP
400.0000 mg | Freq: Every day | ORAL | Status: DC
  Administered 2020-12-06: 400 mg via ORAL
  Filled 2020-12-05: qty 10

## 2020-12-05 MED ORDER — ACETAMINOPHEN 325 MG PO TABS
650.0000 mg | ORAL_TABLET | Freq: Four times a day (QID) | ORAL | Status: DC | PRN
  Filled 2020-12-05: qty 2

## 2020-12-05 MED ORDER — SODIUM CHLORIDE 0.9 % IV SOLN: INTRAVENOUS | Status: DC

## 2020-12-05 MED ORDER — ONDANSETRON HCL 4 MG PO TABS: 4.0000 mg | ORAL_TABLET | Freq: Four times a day (QID) | ORAL | Status: DC | PRN

## 2020-12-05 MED ORDER — ONDANSETRON HCL 4 MG/2ML IJ SOLN
4.0000 mg | Freq: Four times a day (QID) | INTRAMUSCULAR | Status: DC | PRN
  Administered 2020-12-05: 4 mg via INTRAVENOUS
  Filled 2020-12-05: qty 2

## 2020-12-05 MED ORDER — OXYCODONE HCL 5 MG PO TABS: 5.0000 mg | ORAL_TABLET | ORAL | Status: DC | PRN

## 2020-12-05 MED ORDER — INSULIN ASPART 100 UNIT/ML IJ SOLN: 0.0000 [IU] | Freq: Three times a day (TID) | INTRAMUSCULAR | Status: DC

## 2020-12-05 MED ORDER — ASPIRIN EC 81 MG PO TBEC
81.0000 mg | DELAYED_RELEASE_TABLET | Freq: Every day | ORAL | Status: DC
  Administered 2020-12-05 – 2020-12-06 (×2): 81 mg via ORAL
  Filled 2020-12-05 (×2): qty 1

## 2020-12-05 MED ORDER — SERTRALINE HCL 50 MG PO TABS
50.0000 mg | ORAL_TABLET | Freq: Every day | ORAL | Status: DC
  Administered 2020-12-05: 50 mg via ORAL
  Filled 2020-12-05: qty 1

## 2020-12-05 MED ORDER — NICOTINE 14 MG/24HR TD PT24: 14.0000 mg | MEDICATED_PATCH | Freq: Every day | TRANSDERMAL | Status: DC | PRN

## 2020-12-05 MED ORDER — ACETAMINOPHEN 650 MG RE SUPP: 650.0000 mg | Freq: Four times a day (QID) | RECTAL | Status: DC | PRN

## 2020-12-05 MED ORDER — POTASSIUM CHLORIDE 10 MEQ/100ML IV SOLN
10.0000 meq | INTRAVENOUS | Status: AC
Start: 2020-12-05 — End: 2020-12-05
  Administered 2020-12-05 (×4): 10 meq via INTRAVENOUS
  Filled 2020-12-05 (×4): qty 100

## 2020-12-05 MED ORDER — HEPARIN SODIUM (PORCINE) 5000 UNIT/ML IJ SOLN
5000.0000 [IU] | Freq: Three times a day (TID) | INTRAMUSCULAR | Status: DC
  Administered 2020-12-05 – 2020-12-06 (×4): 5000 [IU] via SUBCUTANEOUS
  Filled 2020-12-05 (×5): qty 1

## 2020-12-05 MED ORDER — INSULIN ASPART 100 UNIT/ML IJ SOLN: 0.0000 [IU] | Freq: Every day | INTRAMUSCULAR | Status: DC

## 2020-12-05 MED ORDER — ENSURE ENLIVE PO LIQD
237.0000 mL | Freq: Two times a day (BID) | ORAL | Status: DC
  Administered 2020-12-05 – 2020-12-06 (×3): 237 mL via ORAL

## 2020-12-05 MED ORDER — METOPROLOL SUCCINATE ER 50 MG PO TB24
100.0000 mg | ORAL_TABLET | Freq: Every day | ORAL | Status: DC
  Administered 2020-12-05: 100 mg via ORAL
  Filled 2020-12-05: qty 2

## 2020-12-05 MED ORDER — MEGESTROL ACETATE 400 MG/10ML PO SUSP: 400.0000 mg | Freq: Two times a day (BID) | ORAL | Status: DC

## 2020-12-05 MED ORDER — MAGNESIUM SULFATE 2 GM/50ML IV SOLN
2.0000 g | Freq: Once | INTRAVENOUS | Status: AC
  Administered 2020-12-05: 2 g via INTRAVENOUS
  Filled 2020-12-05: qty 50

## 2020-12-05 MED ORDER — POLYETHYLENE GLYCOL 3350 17 G PO PACK: 17.0000 g | PACK | Freq: Every day | ORAL | Status: DC | PRN

## 2020-12-05 MED ORDER — ATORVASTATIN CALCIUM 40 MG PO TABS
40.0000 mg | ORAL_TABLET | Freq: Every day | ORAL | Status: DC
  Administered 2020-12-05: 40 mg via ORAL
  Filled 2020-12-05: qty 1

## 2020-12-05 MED ORDER — LEVOTHYROXINE SODIUM 75 MCG PO TABS
75.0000 ug | ORAL_TABLET | Freq: Every day | ORAL | Status: DC
  Administered 2020-12-05 – 2020-12-06 (×2): 75 ug via ORAL
  Filled 2020-12-05 (×2): qty 1

## 2020-12-05 NOTE — Progress Notes (Signed)
PROGRESS NOTE   Todd [name]  TIW:[id number] DOB: Nov 30, 1955 DOA: 12/04/2020 PCP: [name], MD   Chief Complaint  Patient presents with   Weakness   Level of care: Telemetry  Brief Admission History:  65 y.o. male, with history of diabetes mellitus type 2, bilateral amputee, thyroid disease, hyperlipidemia, hypertension, and more presents the ED with a chief complaint of altered mental status.  Family had told ER provider that patient had been discharged from his PCPs practice secondary to noncompliance.  Patient has not been taking his diabetic medications or checking his sugars at home.  He is been altered with some confusion and generalized weakness.  This was the history that was reportedly given to the ED provider.  Patient reports he is here because he fell out of his wheelchair.  He reports it was a mechanical fall, but he is not able to give any details.  He reports he did not lose consciousness and he did not hit his head.  He does admit that he has been generally weak lately.  He reports he has a decreased appetite.  He had Megace prescribed at some point, and reports that he still taking that, but the accuracy of which meds he is actually taking is unclear at this time.  Patient reports that he just has no appetite, there is no associated abdominal pain, nausea, vomiting, diarrhea, or bowel incontinence.  He denies any fevers, chest pain, palpitations, dyspnea.  He does report that he has not been checking his sugars at home.  Patient does not have any other complaints at this time.    Patient is current smoker and smokes 1.5 packs/day.  He drinks alcohol 1-2 drinks per week.  He does not use illicit drugs.  He is not vaccinated for COVID.  Patient is full code.   In the ED Temp 97.5, heart rate 68, respiratory rate 18, blood pressure 144/82 White blood cell count 9.6, hemoglobin 10.2 Chemistry panel reveals a decreased bicarb at 18, increased creatinine at 2 Initial glucose  in the ED was in the 20s, improved with food and juice 132 Chest x-ray shows no acute cardiopulmonary disease Admission requested for observation to monitor CBGs and possibly help him with medication assistance and PCP needs  Assessment & Plan:   Principal Problem:   AKI (acute kidney injury) ([place]) Active Problems:   Hypothyroidism   Diabetes type 2, uncontrolled ([place])   Current smoker   Mixed hyperlipidemia   Essential hypertension, benign   Acute metabolic encephalopathy   Hypoglycemia  Acute metabolic encephalopathy secondary to severe hypoglycemia - improving with supportive measures. Follow closely.   Severe hypoglycemia - mostly likely a result of severe hypothyroidism as evidenced by TSH>28, follow CBG closely, check AM cortisol level, encourage oral intake.   AKI - prerenal treating with IV fluid hydration, renally dose medications.   Poor oral intake - likely from severe hypothyroidism, resumed levothyroxine.    Hyperlipidemia - resume atorvastatin.   Severe hypothyroidism - from not taking levothyroxine supplementation for unknown time, resume levothyroxine dose daily.    Tobacco use - encouraged cessation, offer nicotine patch  Cocaine abuse - hold metoprolol for now, counseled on cessation.   DVT prophylaxis: SQ heparin, SCDs Code Status: full  Family Communication:  Disposition: TBD Status is: Observation  The patient remains OBS appropriate and will d/c before 2 midnights.  Dispo: The patient is from: Home              Anticipated d/c is  to:  TBD              Patient currently is not medically stable to d/c.   Difficult to place patient No  Consultants:  Dietitian   Procedures:  N/a   Antimicrobials:  N/a    Subjective: Pt without complaints  Objective: Vitals:   12/05/20 0700 12/05/20 0800 12/05/20 0818 12/05/20 0904  BP: 132/70 (!) 142/67  126/75  Pulse:   76 67  Resp: 16 13  15   Temp:    98 F (36.7 C)  TempSrc:    Oral  SpO2: 100%  100%  97%  Weight:        Intake/Output Summary (Last 24 hours) at 12/05/2020 1412 Last data filed at 12/05/2020 0900 Gross per 24 hour  Intake 334.39 ml  Output --  Net 334.39 ml   Filed Weights   12/05/20 0127  Weight: 55.7 kg   Examination:  General exam: Appears calm and comfortable  Respiratory system: Clear to auscultation. Respiratory effort normal. Cardiovascular system: normal S1 & S2 heard. No JVD, murmurs, rubs, gallops or clicks. No pedal edema. Gastrointestinal system: Abdomen is nondistended, soft and nontender. No organomegaly or masses felt. Normal bowel sounds heard. Central nervous system: Alert and oriented. No focal neurological deficits. Extremities: bilateral AKA. Skin: No rashes, lesions or ulcers Psychiatry: Judgement and insight appear poor. Mood & affect flat.   Data Reviewed: I have personally reviewed following labs and imaging studies  CBC: Recent Labs  Lab 12/04/20 2041 12/05/20 0412  WBC 9.6 8.9  NEUTROABS 7.0 6.3  HGB 10.2* 10.3*  HCT 32.0* 32.8*  MCV 85.3 85.4  PLT 380 932    Basic Metabolic Panel: Recent Labs  Lab 12/04/20 2041 12/05/20 0412  NA 133* 135  K 3.7 3.1*  CL 103 108  CO2 18* 17*  GLUCOSE 132* 94  BUN 28* 27*  CREATININE 2.08* 1.72*  CALCIUM 8.3* 7.8*  MG 1.7  --     GFR: Estimated Creatinine Clearance: 33.7 mL/min (A) (by C-G formula based on SCr of 1.72 mg/dL (H)).  Liver Function Tests: Recent Labs  Lab 12/05/20 0412  AST 14*  ALT 8  ALKPHOS 70  BILITOT 2.1*  PROT 7.0  ALBUMIN 2.1*    CBG: Recent Labs  Lab 12/04/20 2125 12/04/20 2334 12/05/20 0429 12/05/20 0740 12/05/20 1209  GLUCAP 105* 95 85 96 105*    Recent Results (from the past 240 hour(s))  Resp Panel by RT-PCR (Flu A&B, Covid) Nasopharyngeal Swab     Status: None   Collection Time: 12/04/20  8:55 PM   Specimen: Nasopharyngeal Swab; Nasopharyngeal(NP) swabs in vial transport medium  Result Value Ref Range Status   SARS  Coronavirus 2 by RT PCR NEGATIVE NEGATIVE Final    Comment: (NOTE) SARS-CoV-2 target nucleic acids are NOT DETECTED.  The SARS-CoV-2 RNA is generally detectable in upper respiratory specimens during the acute phase of infection. The lowest concentration of SARS-CoV-2 viral copies this assay can detect is 138 copies/mL. A negative result does not preclude SARS-Cov-2 infection and should not be used as the sole basis for treatment or other patient management decisions. A negative result may occur with  improper specimen collection/handling, submission of specimen other than nasopharyngeal swab, presence of viral mutation(s) within the areas targeted by this assay, and inadequate number of viral copies(<138 copies/mL). A negative result must be combined with clinical observations, patient history, and epidemiological information. The expected result is Negative.  Fact Sheet for Patients:  EntrepreneurPulse.com.au  Fact Sheet for Healthcare Providers:  IncredibleEmployment.be  This test is no t yet approved or cleared by the [place] FDA and  has been authorized for detection and/or diagnosis of SARS-CoV-2 by FDA under an Emergency Use Authorization (EUA). This EUA will remain  in effect (meaning this test can be used) for the duration of the COVID-19 declaration under Section 564(b)(1) of the Act, 21 U.S.C.section 360bbb-3(b)(1), unless the authorization is terminated  or revoked sooner.       Influenza A by PCR NEGATIVE NEGATIVE Final   Influenza B by PCR NEGATIVE NEGATIVE Final    Comment: (NOTE) The Xpert Xpress SARS-CoV-2/FLU/RSV plus assay is intended as an aid in the diagnosis of influenza from Nasopharyngeal swab specimens and should not be used as a sole basis for treatment. Nasal washings and aspirates are unacceptable for Xpert Xpress SARS-CoV-2/FLU/RSV testing.  Fact Sheet for  Patients: EntrepreneurPulse.com.au  Fact Sheet for Healthcare Providers: IncredibleEmployment.be  This test is not yet approved or cleared by the [place] FDA and has been authorized for detection and/or diagnosis of SARS-CoV-2 by FDA under an Emergency Use Authorization (EUA). This EUA will remain in effect (meaning this test can be used) for the duration of the COVID-19 declaration under Section 564(b)(1) of the Act, 21 U.S.C. section 360bbb-3(b)(1), unless the authorization is terminated or revoked.  Performed at [facility], [address], [place], [place] [address]   MRSA Next Gen by PCR, Nasal     Status: Abnormal   Collection Time: 12/05/20  1:11 AM   Specimen: Nasal Mucosa; Nasal Swab  Result Value Ref Range Status   MRSA by PCR Next Gen DETECTED (A) NOT DETECTED Final    Comment: RESULT CALLED TO, READ BACK BY AND VERIFIED WITH: KINDLEY,C ON 12/05/20 BY JUW (NOTE) The GeneXpert MRSA Assay (FDA approved for NASAL specimens only), is one component of a comprehensive MRSA colonization surveillance program. It is not intended to diagnose MRSA infection nor to guide or monitor treatment for MRSA infections. Test performance is not FDA approved in patients less than 16 years old. Performed at [facility], [address] [place], [place] [address]      Radiology Studies: DG Chest Portable 1 View  Result Date: 12/04/2020 CLINICAL DATA:  Infection, near syncope with generalized weakness. EXAM: PORTABLE CHEST 1 VIEW COMPARISON:  February 04, 2018. FINDINGS: EKG leads projecting over the chest. Cardiomediastinal contours and hilar structures are stable. Calcified atheromatous plaque of the thoracic aorta. Signs of thyroidectomy at the thoracic inlet. Lungs are clear. No signs of pneumothorax.  No effusion. On limited assessment no acute skeletal process. IMPRESSION: No acute cardiopulmonary disease. Electronically Signed   By: Zetta Bills  M.D.   On: 12/04/2020 21:18    Scheduled Meds:  aspirin EC  81 mg Oral Daily   atorvastatin  40 mg Oral Daily   feeding supplement  237 mL Oral BID BM   heparin  5,000 Units Subcutaneous Q8H   insulin aspart  0-5 Units Subcutaneous QHS   insulin aspart  0-9 Units Subcutaneous TID WC   levothyroxine  75 mcg Oral Q0600   megestrol  400 mg Oral Daily   sertraline  50 mg Oral QHS   Continuous Infusions:  sodium chloride     sodium chloride 75 mL/hr at 12/05/20 0819   potassium chloride 10 mEq (12/05/20 1322)    LOS: 0 days   Time spent: 38 minutes   Delbert Vu, MD How to contact the  [facility] Attending or Consulting provider [place] or covering provider during after hours [place], for this patient?  Check the care team in [facility] and look for a) attending/consulting [facility] provider listed and b) the [facility] team listed Log into [email or web address] and use [place] universal password to access. If you do not have the password, please contact the hospital operator. Locate the [facility] provider you are looking for under [facility] and page to a number that you can be directly reached. If you still have difficulty reaching the provider, please page the [facility] (Director on Call) for the Hospitalists listed on amion for assistance.  12/05/2020, 2:12 PM

## 2020-12-05 NOTE — H&P (Signed)
TRH H&P    Patient Demographics:    Todd [name], is a 65 y.o. male  MRN: [id number]  DOB - 1955-08-24  Admit Date - 12/04/2020  Referring MD/NP/PA: Langston Masker  Outpatient Primary MD for the patient is [name], MD  Patient coming from: Home  Chief complaint-altered mental status   HPI:    Todd [name]  is a 65 y.o. male, with history of diabetes mellitus type 2, bilateral amputee, thyroid disease, hyperlipidemia, hypertension, and more presents the ED with a chief complaint of altered mental status.  Family had told ER provider that patient had been discharged from his PCPs practice secondary to noncompliance.  Patient has not been taking his diabetic medications or checking his sugars at home.  He is been altered with some confusion and generalized weakness.  This was the history that was reportedly given to the ED provider.  Patient reports he is here because he fell out of his wheelchair.  He reports it was a mechanical fall, but he is not able to give any details.  He reports he did not lose consciousness and he did not hit his head.  He does admit that he has been generally weak lately.  He reports he has a decreased appetite.  He had Megace prescribed at some point, and reports that he still taking that, but the accuracy of which meds he is actually taking is unclear at this time.  Patient reports that he just has no appetite, there is no associated abdominal pain, nausea, vomiting, diarrhea, or bowel incontinence.  He denies any fevers, chest pain, palpitations, dyspnea.  He does report that he has not been checking his sugars at home.  Patient does not have any other complaints at this time.   Patient is current smoker and smokes 1.5 packs/day.  He drinks alcohol 1-2 drinks per week.  He does not use illicit drugs.  He is not vaccinated for COVID.  Patient is full code.  In the ED Temp 97.5, heart  rate 68, respiratory rate 18, blood pressure 144/82 White blood cell count 9.6, hemoglobin 10.2 Chemistry panel reveals a decreased bicarb at 18, increased creatinine at 2 Initial glucose in the ED was in the 20s, improved with food and juice 132 Chest x-ray shows no acute cardiopulmonary disease Admission requested for observation to monitor CBGs and possibly help him with medication assistance and PCP needs    Review of systems:    In addition to the HPI above,  No Fever-chills, No Headache, No changes with Vision or hearing, No problems swallowing food or Liquids, No Chest pain, Cough or Shortness of Breath, No Abdominal pain, No Nausea or Vomiting, bowel movements are regular, No Blood in stool or Urine, No dysuria, No new skin rashes or bruises, No new joints pains-aches,  No new weakness, tingling, numbness in any extremity, No recent weight gain or loss, No polyuria, polydypsia or polyphagia, No significant Mental Stressors.  All other systems reviewed and are negative.    Past History of the following :  Past Medical History:  Diagnosis Date   AKI (acute kidney injury) ([place])    Constipated    Diabetes mellitus without complication ([place])    Diarrhea    Elevated LFTs    Goiter    Gout    Hyperlipidemia    Hypertension    Leukocytosis    Reactive thrombocytosis    Right BKA infection ([place]) 08/2016   Right leg pain    Sepsis due to undetermined organism [facility])    Thyroid disease    Wound infection after surgery 08/2016      Past Surgical History:  Procedure Laterality Date   ABDOMINAL AORTOGRAM N/A 08/11/2016   Procedure: Abdominal Aortogram;  Surgeon: Waynetta Sandy, MD;  Location: [place] [facility];  Service: Cardiovascular;  Laterality: N/A;   ABDOMINAL AORTOGRAM W/LOWER EXTREMITY N/A 08/15/2016   Procedure: Abdominal Aortogram w/Lower Extremity;  Surgeon: Elam Dutch, MD;  Location: [place] [facility];  Service: Cardiovascular;  Laterality:  N/A;   AMPUTATION Right 08/17/2016   Procedure: RIGHT BELOW KNEE AMPUTATION;  Surgeon: Elam Dutch, MD;  Location: [place];  Service: Vascular;  Laterality: Right;   AMPUTATION Right 09/12/2016   Procedure: AMPUTATION ABOVE KNEE;  Surgeon: Newt Minion, MD;  Location: [place];  Service: Orthopedics;  Laterality: Right;   AMPUTATION Left 08/12/2016   Procedure: LEFT BELOW KNEE AMPUTATION;  Surgeon: Newt Minion, MD;  Location: [place];  Service: Orthopedics;  Laterality: Left;   AMPUTATION Left 11/01/2017   Procedure: LEFT ABOVE KNEE AMPUTATION;  Surgeon: Newt Minion, MD;  Location: [place];  Service: Orthopedics;  Laterality: Left;   APPLICATION OF WOUND VAC Right 09/12/2016   Procedure: APPLICATION OF WOUND VAC ABOVE KNEE;  Surgeon: Newt Minion, MD;  Location: [place];  Service: Orthopedics;  Laterality: Right;   LOWER EXTREMITY ANGIOGRAPHY Bilateral 08/11/2016   Procedure: Lower Extremity Angiography;  Surgeon: Waynetta Sandy, MD;  Location: [place] [facility];  Service: Cardiovascular;  Laterality: Bilateral;   PERIPHERAL VASCULAR BALLOON ANGIOPLASTY Left 08/11/2016   Procedure: Peripheral Vascular Balloon Angioplasty;  Surgeon: Waynetta Sandy, MD;  Location: [place] [facility];  Service: Cardiovascular;  Laterality: Left;  SFA   THYROID SURGERY        Social History:      Social History   Tobacco Use   Smoking status: Every Day    Packs/day: 0.25    Years: 45.00    Pack years: 11.25    Types: Cigarettes    Last attempt to quit: 11/12/2014    Years since quitting: 6.0   Smokeless tobacco: Never  Substance Use Topics   Alcohol use: Not Currently    Alcohol/week: 0.0 standard drinks       Family History :     Family History  Problem Relation Age of Onset   Heart disease Mother    Pneumonia Father    Diabetes Maternal Aunt    Diabetes Maternal Uncle       Home Medications:   Prior to Admission medications   Medication Sig Start Date End Date Taking?  Authorizing Provider  allopurinol (ZYLOPRIM) 100 MG tablet Take 1 tablet (100 mg total) by mouth daily. 09/28/20   [name], MD  aspirin EC 81 MG tablet Take 81 mg by mouth daily.    [provider]  atorvastatin (LIPITOR) 40 MG tablet TAKE 1 TABLET BY MOUTH ONCE DAILY AT 6 PM 07/27/20   [name], MD  blood glucose  meter kit and supplies Dispense based on patient and insurance preference. Use up to four times daily as directed. (FOR ICD-10 E10.9, E11.9). 02/05/18   Johnson, Clanford L, MD  cetirizine (ZYRTEC) 10 MG tablet Take 1 tablet (10 mg total) by mouth daily. For allergy symptoms 07/27/20   [name], MD  folic acid (FOLVITE) 1 MG tablet Take 1 tablet (1 mg total) by mouth daily. 02/06/18   Johnson, Clanford L, MD  glucose blood (ACCU-CHEK AVIVA PLUS) test strip Check blood sugars four times a day 03/12/18   [name], MD  Hydroactive Dressings (TEGADERM HYDROCOLLOID THIN) MISC Apply 1 each topically 2 (two) times a week. 02/20/20   [name], MD  insulin glargine (LANTUS SOLOSTAR) 100 UNIT/ML Solostar Pen Inject 30 Units into the skin at bedtime. 08/13/20   Brita Romp, NP  Insulin Pen Needle 31G X 5 MM MISC 1 Device by Does not apply route as directed. 02/05/18   Murlean Iba, MD  levothyroxine (SYNTHROID) 75 MCG tablet Take 1 tablet (75 mcg total) by mouth daily before breakfast. 09/28/20   [name], MD  megestrol (MEGACE) 400 MG/10ML suspension Take 10 mLs (400 mg total) by mouth 2 (two) times daily. For appetite stimulation 07/27/20   [name], MD  metoprolol succinate (TOPROL-XL) 100 MG 24 hr tablet TAKE 1 TABLET BY MOUTH ONCE DAILY WITH MEALS OR  IMMEDIATELY  FOLLOWING 07/27/20   [name], MD  sertraline (ZOLOFT) 50 MG tablet TAKE 1 TABLET BY MOUTH AT BEDTIME FOR ANXIETY AND FOR DEPRESSION 09/08/20   [name], MD  Vitamin D, Ergocalciferol, (DRISDOL) 1.25 MG (50000 UNIT) CAPS capsule Take 1 capsule by mouth once a week 10/09/19    Cassandria Anger, MD     Allergies:     Allergies  Allergen Reactions   Lisinopril Other (See Comments)    Hyperkalemia / Renal failure     Physical Exam:   Vitals  Blood pressure (!) 143/90, pulse 76, temperature (!) 97.5 F (36.4 C), temperature source Oral, resp. rate 15, weight 55.7 kg, SpO2 100 %.  1.  General: Patient lying supine in bed,  no acute distress   2. Psychiatric: Alert and oriented x 3, flat affect, behavior normal for situation, pleasant and cooperative with exam   3. Neurologic: Speech and language are normal, face is symmetric, moves all 4 extremities voluntarily, at baseline without acute deficits on limited exam   4. HEENMT:  Head is atraumatic, normocephalic, pupils reactive to light, neck is supple, trachea is midline, mucous membranes are moist   5. Respiratory : Lungs are clear to auscultation bilaterally without wheezing, rhonchi, rales, no cyanosis, clubbing present, no increase in work of breathing or accessory muscle use   6. Cardiovascular : Heart rate normal, rhythm is regular, no murmurs, rubs or gallops, no peripheral edema, peripheral pulses palpated   7. Gastrointestinal:  Abdomen is soft, nondistended, nontender to palpation bowel sounds active, no masses or organomegaly palpated   8. Skin:  Skin is warm, dry and intact without rashes, acute lesions, or ulcers on limited exam   9.Musculoskeletal:  Bilateral BKA's, no tenderness to palpation lower extremities    Data Review:    CBC Recent Labs  Lab 12/04/20 2041  WBC 9.6  HGB 10.2*  HCT 32.0*  PLT 380  MCV 85.3  MCH 27.2  MCHC 31.9  RDW 15.3  LYMPHSABS 2.0  MONOABS 0.5  EOSABS 0.0  BASOSABS 0.0   ------------------------------------------------------------------------------------------------------------------  Results for  orders placed or performed during the hospital encounter of 12/04/20 (from the past 48 hour(s))  CBG monitoring, ED     Status: Abnormal    Collection Time: 12/04/20  7:28 PM  Result Value Ref Range   Glucose-Capillary 27 (LL) 70 - 99 mg/dL    Comment: Glucose reference range applies only to samples taken after fasting for at least 8 hours.   Comment 1 Repeat Test   CBG monitoring, ED     Status: None   Collection Time: 12/04/20  7:30 PM  Result Value Ref Range   Glucose-Capillary 75 70 - 99 mg/dL    Comment: Glucose reference range applies only to samples taken after fasting for at least 8 hours.   Comment 1 Notify RN   CBG monitoring, ED     Status: None   Collection Time: 12/04/20  8:05 PM  Result Value Ref Range   Glucose-Capillary 83 70 - 99 mg/dL    Comment: Glucose reference range applies only to samples taken after fasting for at least 8 hours.  Basic metabolic panel     Status: Abnormal   Collection Time: 12/04/20  8:41 PM  Result Value Ref Range   Sodium 133 (L) 135 - 145 mmol/L   Potassium 3.7 3.5 - 5.1 mmol/L   Chloride 103 98 - 111 mmol/L   CO2 18 (L) 22 - 32 mmol/L   Glucose, Bld 132 (H) 70 - 99 mg/dL    Comment: Glucose reference range applies only to samples taken after fasting for at least 8 hours.   BUN 28 (H) 8 - 23 mg/dL   Creatinine, Ser 2.08 (H) 0.61 - 1.24 mg/dL   Calcium 8.3 (L) 8.9 - 10.3 mg/dL   GFR, Estimated 35 (L) >60 mL/min    Comment: (NOTE) Calculated using the CKD-EPI Creatinine Equation (2021)    Anion gap 12 5 - 15    Comment: Performed at [facility], [address] [place], [place] [address]  CBC with Differential     Status: Abnormal   Collection Time: 12/04/20  8:41 PM  Result Value Ref Range   WBC 9.6 4.0 - 10.5 K/uL   RBC 3.75 (L) 4.22 - 5.81 MIL/uL   Hemoglobin 10.2 (L) 13.0 - 17.0 g/dL   HCT 32.0 (L) 39.0 - 52.0 %   MCV 85.3 80.0 - 100.0 fL   MCH 27.2 26.0 - 34.0 pg   MCHC 31.9 30.0 - 36.0 g/dL   RDW 15.3 11.5 - 15.5 %   Platelets 380 150 - 400 K/uL   nRBC 0.0 0.0 - 0.2 %   Neutrophils Relative % 73 %   Neutro Abs 7.0 1.7 - 7.7 K/uL   Lymphocytes Relative 21  %   Lymphs Abs 2.0 0.7 - 4.0 K/uL   Monocytes Relative 5 %   Monocytes Absolute 0.5 0.1 - 1.0 K/uL   Eosinophils Relative 0 %   Eosinophils Absolute 0.0 0.0 - 0.5 K/uL   Basophils Relative 0 %   Basophils Absolute 0.0 0.0 - 0.1 K/uL   Immature Granulocytes 1 %   Abs Immature Granulocytes 0.08 (H) 0.00 - 0.07 K/uL    Comment: Performed at [facility], [address], [place], [place] [address]  Troponin I (High Sensitivity)     Status: None   Collection Time: 12/04/20  8:41 PM  Result Value Ref Range   Troponin I (High Sensitivity) 10 <18 ng/L    Comment: (NOTE) Elevated high sensitivity troponin I (hsTnI) values and  significant  changes across serial measurements may suggest ACS but many other  chronic and acute conditions are known to elevate hsTnI results.  Refer to the "Links" section for chest pain algorithms and additional  guidance. Performed at [facility], [address], [place], [place] [address]   Magnesium     Status: None   Collection Time: 12/04/20  8:41 PM  Result Value Ref Range   Magnesium 1.7 1.7 - 2.4 mg/dL    Comment: Performed at [facility], [address] [place], [place] [address]  Resp Panel by RT-PCR (Flu A&B, Covid) Nasopharyngeal Swab     Status: None   Collection Time: 12/04/20  8:55 PM   Specimen: Nasopharyngeal Swab; Nasopharyngeal(NP) swabs in vial transport medium  Result Value Ref Range   SARS Coronavirus 2 by RT PCR NEGATIVE NEGATIVE    Comment: (NOTE) SARS-CoV-2 target nucleic acids are NOT DETECTED.  The SARS-CoV-2 RNA is generally detectable in upper respiratory specimens during the acute phase of infection. The lowest concentration of SARS-CoV-2 viral copies this assay can detect is 138 copies/mL. A negative result does not preclude SARS-Cov-2 infection and should not be used as the sole basis for treatment or other patient management decisions. A negative result may occur with  improper specimen collection/handling, submission of  specimen other than nasopharyngeal swab, presence of viral mutation(s) within the areas targeted by this assay, and inadequate number of viral copies(<138 copies/mL). A negative result must be combined with clinical observations, patient history, and epidemiological information. The expected result is Negative.  Fact Sheet for Patients:  EntrepreneurPulse.com.au  Fact Sheet for Healthcare Providers:  IncredibleEmployment.be  This test is no t yet approved or cleared by the [place] FDA and  has been authorized for detection and/or diagnosis of SARS-CoV-2 by FDA under an Emergency Use Authorization (EUA). This EUA will remain  in effect (meaning this test can be used) for the duration of the COVID-19 declaration under Section 564(b)(1) of the Act, 21 U.S.C.section 360bbb-3(b)(1), unless the authorization is terminated  or revoked sooner.       Influenza A by PCR NEGATIVE NEGATIVE   Influenza B by PCR NEGATIVE NEGATIVE    Comment: (NOTE) The Xpert Xpress SARS-CoV-2/FLU/RSV plus assay is intended as an aid in the diagnosis of influenza from Nasopharyngeal swab specimens and should not be used as a sole basis for treatment. Nasal washings and aspirates are unacceptable for Xpert Xpress SARS-CoV-2/FLU/RSV testing.  Fact Sheet for Patients: EntrepreneurPulse.com.au  Fact Sheet for Healthcare Providers: IncredibleEmployment.be  This test is not yet approved or cleared by the [place] FDA and has been authorized for detection and/or diagnosis of SARS-CoV-2 by FDA under an Emergency Use Authorization (EUA). This EUA will remain in effect (meaning this test can be used) for the duration of the COVID-19 declaration under Section 564(b)(1) of the Act, 21 U.S.C. section 360bbb-3(b)(1), unless the authorization is terminated or revoked.  Performed at [facility], [address], [place], [place]  [address]   CBG monitoring, ED (now and then every hour for 3 hours)     Status: Abnormal   Collection Time: 12/04/20  9:25 PM  Result Value Ref Range   Glucose-Capillary 105 (H) 70 - 99 mg/dL    Comment: Glucose reference range applies only to samples taken after fasting for at least 8 hours.   Comment 1 Notify RN    Comment 2 Document in Chart   Troponin I (High Sensitivity)     Status: None  Collection Time: 12/04/20 10:21 PM  Result Value Ref Range   Troponin I (High Sensitivity) 11 <18 ng/L    Comment: (NOTE) Elevated high sensitivity troponin I (hsTnI) values and significant  changes across serial measurements may suggest ACS but many other  chronic and acute conditions are known to elevate hsTnI results.  Refer to the "Links" section for chest pain algorithms and additional  guidance. Performed at [facility], [address] [place], [place] [address]   Urinalysis, Routine w reflex microscopic Urine, Clean Catch     Status: Abnormal   Collection Time: 12/04/20 11:00 PM  Result Value Ref Range   Color, Urine AMBER (A) YELLOW    Comment: BIOCHEMICALS MAY BE AFFECTED BY COLOR   APPearance CLEAR CLEAR   Specific Gravity, Urine 1.015 1.005 - 1.030   pH 5.0 5.0 - 8.0   Glucose, UA NEGATIVE NEGATIVE mg/dL   Hgb urine dipstick SMALL (A) NEGATIVE   Bilirubin Urine SMALL (A) NEGATIVE   Ketones, ur 5 (A) NEGATIVE mg/dL   Protein, ur NEGATIVE NEGATIVE mg/dL   Nitrite NEGATIVE NEGATIVE   Leukocytes,Ua MODERATE (A) NEGATIVE   RBC / HPF 21-50 0 - 5 RBC/hpf   WBC, UA >50 (H) 0 - 5 WBC/hpf   Bacteria, UA RARE (A) NONE SEEN   Squamous Epithelial / LPF 0-5 0 - 5   Hyaline Casts, UA PRESENT     Comment: Performed at [facility], [address] [place], [place] [address]  CBG monitoring, ED (now and then every hour for 3 hours)     Status: None   Collection Time: 12/04/20 11:34 PM  Result Value Ref Range   Glucose-Capillary 95 70 - 99 mg/dL    Comment: Glucose reference range applies  only to samples taken after fasting for at least 8 hours.    Chemistries  Recent Labs  Lab 12/04/20 2041  NA 133*  K 3.7  CL 103  CO2 18*  GLUCOSE 132*  BUN 28*  CREATININE 2.08*  CALCIUM 8.3*  MG 1.7   ------------------------------------------------------------------------------------------------------------------  ------------------------------------------------------------------------------------------------------------------ GFR: Estimated Creatinine Clearance: 27.9 mL/min (A) (by C-G formula based on SCr of 2.08 mg/dL (H)). Liver Function Tests: No results for input(s): AST, ALT, ALKPHOS, BILITOT, PROT, ALBUMIN in the last 168 hours. No results for input(s): LIPASE, AMYLASE in the last 168 hours. No results for input(s): AMMONIA in the last 168 hours. Coagulation Profile: No results for input(s): INR, PROTIME in the last 168 hours. Cardiac Enzymes: No results for input(s): CKTOTAL, CKMB, CKMBINDEX, TROPONINI in the last 168 hours. BNP (last 3 results) No results for input(s): PROBNP in the last 8760 hours. HbA1C: No results for input(s): HGBA1C in the last 72 hours. CBG: Recent Labs  Lab 12/04/20 1928 12/04/20 1930 12/04/20 2005 12/04/20 2125 12/04/20 2334  GLUCAP 27* 75 83 105* 95   Lipid Profile: No results for input(s): CHOL, HDL, LDLCALC, TRIG, CHOLHDL, LDLDIRECT in the last 72 hours. Thyroid Function Tests: No results for input(s): TSH, T4TOTAL, FREET4, T3FREE, THYROIDAB in the last 72 hours. Anemia Panel: No results for input(s): VITAMINB12, FOLATE, FERRITIN, TIBC, IRON, RETICCTPCT in the last 72 hours.  --------------------------------------------------------------------------------------------------------------- Urine analysis:    Component Value Date/Time   COLORURINE AMBER (A) 12/04/2020 2300   APPEARANCEUR CLEAR 12/04/2020 2300   APPEARANCEUR Clear 02/09/2018 1352   LABSPEC 1.015 12/04/2020 2300   PHURINE 5.0 12/04/2020 2300   GLUCOSEU  NEGATIVE 12/04/2020 2300   HGBUR SMALL (A) 12/04/2020 2300   BILIRUBINUR SMALL (A) 12/04/2020  2300   BILIRUBINUR Negative 02/09/2018 1352   KETONESUR 5 (A) 12/04/2020 2300   PROTEINUR NEGATIVE 12/04/2020 2300   NITRITE NEGATIVE 12/04/2020 2300   LEUKOCYTESUR MODERATE (A) 12/04/2020 2300      Imaging Results:    DG Chest Portable 1 View  Result Date: 12/04/2020 CLINICAL DATA:  Infection, near syncope with generalized weakness. EXAM: PORTABLE CHEST 1 VIEW COMPARISON:  February 04, 2018. FINDINGS: EKG leads projecting over the chest. Cardiomediastinal contours and hilar structures are stable. Calcified atheromatous plaque of the thoracic aorta. Signs of thyroidectomy at the thoracic inlet. Lungs are clear. No signs of pneumothorax.  No effusion. On limited assessment no acute skeletal process. IMPRESSION: No acute cardiopulmonary disease. Electronically Signed   By: Zetta Bills M.D.   On: 12/04/2020 21:18    My personal review of EKG: Rhythm NSR, Rate 78/min, QTc 505 ,no Acute ST changes   Assessment & Plan:    Principal Problem:   AKI (acute kidney injury) ([place]) Active Problems:   Hypothyroidism   Diabetes type 2, uncontrolled ([place])   Current smoker   Mixed hyperlipidemia   Essential hypertension, benign   Acute metabolic encephalopathy   Hypoglycemia   Acute metabolic encephalopathy With fall No family at bedside, details difficult to obtain Likely secondary to hypoglycemia Treat as below and continue to monitor Hypoglycemia in the setting of diabetes mellitus type 2 Secondary to poor p.o. intake Glucose in the 20s on arrival, improved to 132 after juice and food Patient is not currently on his home meds but he would normally be on Lantus 30 units daily Holding long-acting insulin, sliding scale coverage as needed, hypoglycemia protocol Check TSH AKI Also secondary to poor p.o. intake Baseline creatinine 1.16, today 2.08 Continue IV fluids and continue to monitor Poor  p.o. intake Continue Megace Hypertension Continue Toprol Hyperlipidemia Continue atorvastatin Thyroid disease Check TSH and continue Synthroid Tobacco use disorder Counseled on cessation -patient is in precontemplative stage Nicotine patch while in the hospital   DVT Prophylaxis-   Heparin- SCDs   AM Labs Ordered, also please review Full Orders  Family Communication: No family at bedside Code Status: Full  Admission status: Observation Time spent in minutes : [place] DO

## 2020-12-05 NOTE — ED Notes (Signed)
Updated pt family [place] Ace via telephone. Family's questions answered and plan of care reviewed as appropriate. Family aware pt is being admitted to ICU as telemetry patient. Family verbalizes understanding.

## 2020-12-06 DIAGNOSIS — Z7401 Bed confinement status: Secondary | ICD-10-CM | POA: Diagnosis not present

## 2020-12-06 DIAGNOSIS — I10 Essential (primary) hypertension: Secondary | ICD-10-CM

## 2020-12-06 DIAGNOSIS — R531 Weakness: Secondary | ICD-10-CM | POA: Diagnosis not present

## 2020-12-06 DIAGNOSIS — E162 Hypoglycemia, unspecified: Secondary | ICD-10-CM | POA: Diagnosis not present

## 2020-12-06 DIAGNOSIS — R279 Unspecified lack of coordination: Secondary | ICD-10-CM | POA: Diagnosis not present

## 2020-12-06 DIAGNOSIS — E782 Mixed hyperlipidemia: Secondary | ICD-10-CM | POA: Diagnosis not present

## 2020-12-06 DIAGNOSIS — G9341 Metabolic encephalopathy: Secondary | ICD-10-CM | POA: Diagnosis not present

## 2020-12-06 DIAGNOSIS — F17200 Nicotine dependence, unspecified, uncomplicated: Secondary | ICD-10-CM | POA: Diagnosis not present

## 2020-12-06 DIAGNOSIS — E039 Hypothyroidism, unspecified: Secondary | ICD-10-CM | POA: Diagnosis not present

## 2020-12-06 DIAGNOSIS — E11649 Type 2 diabetes mellitus with hypoglycemia without coma: Secondary | ICD-10-CM

## 2020-12-06 DIAGNOSIS — N179 Acute kidney failure, unspecified: Secondary | ICD-10-CM | POA: Diagnosis not present

## 2020-12-06 LAB — CBC
HCT: 28.9 % — ABNORMAL LOW (ref 39.0–52.0)
Hemoglobin: 9.1 g/dL — ABNORMAL LOW (ref 13.0–17.0)
MCH: 27.4 pg (ref 26.0–34.0)
MCHC: 31.5 g/dL (ref 30.0–36.0)
MCV: 87 fL (ref 80.0–100.0)
Platelets: 328 10*3/uL (ref 150–400)
RBC: 3.32 MIL/uL — ABNORMAL LOW (ref 4.22–5.81)
RDW: 15.7 % — ABNORMAL HIGH (ref 11.5–15.5)
WBC: 7.2 10*3/uL (ref 4.0–10.5)
nRBC: 0 % (ref 0.0–0.2)

## 2020-12-06 LAB — BASIC METABOLIC PANEL
Anion gap: 4 — ABNORMAL LOW (ref 5–15)
BUN: 16 mg/dL (ref 8–23)
CO2: 18 mmol/L — ABNORMAL LOW (ref 22–32)
Calcium: 6.9 mg/dL — ABNORMAL LOW (ref 8.9–10.3)
Chloride: 110 mmol/L (ref 98–111)
Creatinine, Ser: 1.19 mg/dL (ref 0.61–1.24)
GFR, Estimated: >60 mL/min (ref >60)
Glucose, Bld: 96 mg/dL (ref 70–99)
Potassium: 3.3 mmol/L — ABNORMAL LOW (ref 3.5–5.1)
Sodium: 132 mmol/L — ABNORMAL LOW (ref 135–145)

## 2020-12-06 LAB — GLUCOSE, CAPILLARY
Glucose-Capillary: 107 mg/dL — ABNORMAL HIGH (ref 70–99)
Glucose-Capillary: 93 mg/dL (ref 70–99)
Glucose-Capillary: 85 mg/dL (ref 70–99)

## 2020-12-06 LAB — MAGNESIUM: Magnesium: 1.8 mg/dL (ref 1.7–2.4)

## 2020-12-06 MED ORDER — MEGESTROL ACETATE 400 MG/10ML PO SUSP: 400.0000 mg | Freq: Two times a day (BID) | ORAL | 1 refills | Status: DC

## 2020-12-06 MED ORDER — ENSURE ENLIVE PO LIQD: 237.0000 mL | Freq: Two times a day (BID) | ORAL | 1 refills | Status: AC

## 2020-12-06 MED ORDER — SODIUM BICARBONATE 650 MG PO TABS: 650.0000 mg | ORAL_TABLET | Freq: Three times a day (TID) | ORAL | 0 refills | Status: DC

## 2020-12-06 MED ORDER — LEVOTHYROXINE SODIUM 75 MCG PO TABS: 75.0000 ug | ORAL_TABLET | Freq: Every day | ORAL | 1 refills | Status: DC

## 2020-12-06 MED ORDER — SODIUM BICARBONATE 650 MG PO TABS
650.0000 mg | ORAL_TABLET | Freq: Three times a day (TID) | ORAL | Status: DC
  Administered 2020-12-06: 650 mg via ORAL
  Filled 2020-12-06: qty 1

## 2020-12-06 MED ORDER — CETIRIZINE HCL 10 MG PO TABS: 10.0000 mg | ORAL_TABLET | Freq: Every day | ORAL | 1 refills | Status: DC | PRN

## 2020-12-06 MED ORDER — ATORVASTATIN CALCIUM 40 MG PO TABS: ORAL_TABLET | ORAL | 1 refills | Status: DC

## 2020-12-06 MED ORDER — BLOOD GLUCOSE METER KIT: PACK | 0 refills | Status: DC

## 2020-12-06 MED ORDER — POTASSIUM CHLORIDE CRYS ER 20 MEQ PO TBCR
40.0000 meq | EXTENDED_RELEASE_TABLET | Freq: Once | ORAL | Status: AC
  Administered 2020-12-06: 40 meq via ORAL
  Filled 2020-12-06: qty 2

## 2020-12-06 MED ORDER — MAGNESIUM SULFATE 2 GM/50ML IV SOLN
2.0000 g | Freq: Once | INTRAVENOUS | Status: AC
  Administered 2020-12-06: 2 g via INTRAVENOUS
  Filled 2020-12-06: qty 50

## 2020-12-06 MED ORDER — SERTRALINE HCL 50 MG PO TABS: 50.0000 mg | ORAL_TABLET | Freq: Every day | ORAL | 1 refills | Status: DC

## 2020-12-06 MED ORDER — FOLIC ACID 1 MG PO TABS: 1.0000 mg | ORAL_TABLET | Freq: Every day | ORAL | 1 refills | Status: DC

## 2020-12-06 MED ORDER — ENSURE ENLIVE PO LIQD: 237.0000 mL | Freq: Two times a day (BID) | ORAL | 1 refills | Status: DC

## 2020-12-06 MED ORDER — SERTRALINE HCL 50 MG PO TABS: 50.0000 mg | ORAL_TABLET | Freq: Every day | ORAL | 1 refills | Status: AC

## 2020-12-06 MED ORDER — ALLOPURINOL 100 MG PO TABS: 100.0000 mg | ORAL_TABLET | Freq: Every day | ORAL | 1 refills | Status: AC

## 2020-12-06 MED ORDER — ALLOPURINOL 100 MG PO TABS
100.0000 mg | ORAL_TABLET | Freq: Every day | ORAL | 1 refills | Status: DC
Start: 2020-12-06 — End: 2020-12-06

## 2020-12-06 NOTE — Care Management Obs Status (Addendum)
[place] NOTIFICATION   Patient Details  Name: Todd [name] MRN: [id number] Date of Birth: 08-Apr-1956   Medicare Observation Status Notification Given:  Yes    Natasha Bence, LCSW 12/06/2020, 9:19 AM

## 2020-12-06 NOTE — Discharge Instructions (Signed)
Please find a primary care provider as soon as possible to establish care.  Please avoid using recreational drugs and alcohol     IMPORTANT INFORMATION: PAY CLOSE ATTENTION   PHYSICIAN DISCHARGE INSTRUCTIONS  Follow with Primary care provider  Todd Fraise, MD  and other consultants as instructed by your Hospitalist Physician  [place] IF SYMPTOMS COME BACK, WORSEN OR NEW PROBLEM DEVELOPS   Please note: You were cared for by a hospitalist during your hospital stay. Every effort will be made to forward records to your primary care provider.  You can request that your primary care provider send for your hospital records if they have not received them.  Once you are discharged, your primary care physician will handle any further medical issues. Please note that NO REFILLS for any discharge medications will be authorized once you are discharged, as it is imperative that you return to your primary care physician (or establish a relationship with a primary care physician if you do not have one) for your post hospital discharge needs so that they can reassess your need for medications and monitor your lab values.  Please get a complete blood count and chemistry panel checked by your Primary MD at your next visit, and again as instructed by your Primary MD.  Get Medicines reviewed and adjusted: Please take all your medications with you for your next visit with your Primary MD  Laboratory/radiological data: Please request your Primary MD to go over all hospital tests and procedure/radiological results at the follow up, please ask your primary care provider to get all Hospital records sent to his/her office.  In some cases, they will be blood work, cultures and biopsy results pending at the time of your discharge. Please request that your primary care provider follow up on these results.  If you are diabetic, please bring your blood sugar readings with you to  your follow up appointment with primary care.    Please call and make your follow up appointments as soon as possible.    Also Note the following: If you experience worsening of your admission symptoms, develop shortness of breath, life threatening emergency, suicidal or homicidal thoughts you must seek medical attention immediately by calling 911 or calling your MD immediately  if symptoms less severe.  You must read complete instructions/literature along with all the possible adverse reactions/side effects for all the Medicines you take and that have been prescribed to you. Take any new Medicines after you have completely understood and accpet all the possible adverse reactions/side effects.   Do not drive when taking Pain medications or sleeping medications (Benzodiazepines)  Do not take more than prescribed Pain, Sleep and Anxiety Medications. It is not advisable to combine anxiety,sleep and pain medications without talking with your primary care practitioner  Special Instructions: If you have smoked or chewed Tobacco  in the last 2 yrs please stop smoking, stop any regular Alcohol  and or any Recreational drug use.  Wear Seat belts while driving.  Do not drive if taking any narcotic, mind altering or controlled substances or recreational drugs or alcohol.

## 2020-12-06 NOTE — TOC Transition Note (Addendum)
Transition of Care [facility]) - CM/SW Discharge Note   Patient Details  Name: Kolbe Delmonaco MRN: [id number] Date of Birth: 10/05/1955  Transition [facility]) CM/SW Contact:  Natasha Bence, LCSW Phone Number: 12/06/2020, 12:25 PM   Clinical Narrative:    CSW notified of patient's readiness for discharge. Patient's family requested [facility] services. CSW referred patient to [place] with Alvis Lemmings. Georgina Snell agreeable to take patient. Patient's family also requested SA resources, Mental Health resources and PCP list. CSW also provided resources. CSW completed med necessity and called [facility]. TOC signing off.    Final next level of care: [place] Barriers to Discharge: Barriers Resolved   Patient Goals and CMS Choice Patient states their goals for this hospitalization and ongoing recovery are:: Return home with [facility] CMS Medicare.gov Compare Post Acute Care list provided to:: Patient Choice offered to / list presented to : Patient  Discharge Placement                       Discharge Plan and Services                          HH Arranged: RN, PT, Social Work, OT [facility] Agency: [place] Date [facility] Agency Contacted: 12/06/20 Time [place]: [phone] Representative spoke with at [place]: [place] ([place]) Interventions     Readmission Risk Interventions No flowsheet data found.

## 2020-12-06 NOTE — Discharge Summary (Signed)
Physician Discharge Summary  Egbert Seidel HUD:[id number] DOB: 07/22/1955 DOA: 12/04/2020  PCP: [name], MD  Admit date: 12/04/2020 Discharge date: 12/06/2020  Admitted From:  Home  Disposition: Home with Home health services  Recommendations for Outpatient Follow-up:  Please establish care with PCP in 1-2 weeks Please establish care with wound care center in 2-3 weeks Please monitor blood glucose at least 4 times per day Please avoid alcohol and recreational drugs Please establish care with behavioral health, (referral made)  Home Health:  RN, PT   Discharge Condition: STABLE   CODE STATUS: FULL DIET: regular diet recommended    Brief Hospitalization Summary: Please see all hospital notes, images, labs for full details of the hospitalization. ADMISSION HPI:  Todd [name]  is a 65 y.o. male, with history of diabetes mellitus type 2, bilateral amputee, thyroid disease, hyperlipidemia, hypertension, and more presents the ED with a chief complaint of altered mental status.  Family had told ER provider that patient had been discharged from his PCPs practice secondary to noncompliance.  Patient has not been taking his diabetic medications or checking his sugars at home.  He is been altered with some confusion and generalized weakness.  This was the history that was reportedly given to the ED provider.  Patient reports he is here because he fell out of his wheelchair.  He reports it was a mechanical fall, but he is not able to give any details.  He reports he did not lose consciousness and he did not hit his head.  He does admit that he has been generally weak lately.  He reports he has a decreased appetite.  He had Megace prescribed at some point, and reports that he still taking that, but the accuracy of which meds he is actually taking is unclear at this time.  Patient reports that he just has no appetite, there is no associated abdominal pain, nausea, vomiting, diarrhea, or bowel  incontinence.  He denies any fevers, chest pain, palpitations, dyspnea.  He does report that he has not been checking his sugars at home.  Patient does not have any other complaints at this time.   Patient is current smoker and smokes 1.5 packs/day.  He drinks alcohol 1-2 drinks per week.  He does not use illicit drugs.  He is not vaccinated for COVID.  Patient is full code.   In the ED Temp 97.5, heart rate 68, respiratory rate 18, blood pressure 144/82 White blood cell count 9.6, hemoglobin 10.2 Chemistry panel reveals a decreased bicarb at 18, increased creatinine at 2 Initial glucose in the ED was in the 20s, improved with food and juice 132 Chest x-ray shows no acute cardiopulmonary disease Admission requested for observation to monitor CBGs and possibly help him with medication assistance and PCP needs  HOSPITAL COURSE BY PROBLEM   Severe hypoglycemia - mostly likely a result of severe hypothyroidism as evidenced by TSH>28, follow CBG closely and has remained stable, normal cortisol level, encourage oral intake.  RESOLVED    AKI - prerenal treating with IV fluid hydration. RESOLVED.   Poor oral intake - likely from severe hypothyroidism, resumed levothyroxine.  Supplements ordered and megace ordered for appetite stimulation.     Hyperlipidemia - resume atorvastatin.   Severe hypothyroidism - from not taking levothyroxine supplementation for unknown time, resume levothyroxine dose daily.     Tobacco use - encouraged cessation, offer nicotine patch   Cocaine abuse - DC metoprolol for now, counseled on cessation.   Hypertension - he  is having soft BPs now likely from weight loss and poor nutrition, with him being cocaine positive we have discontined the metoprolol for now.  He has been strongly advised to please stop all recreational drug use.   Stage II sacral pressure wound - Pt has been referred to outpatient wound care center and we have ordered a home health RN to assist with  wound care.      Pressure Injury 10/26/17 Stage II -  Partial thickness loss of dermis presenting as a shallow open ulcer with a red, pink wound bed without slough. (Active)  10/26/17 1809  Location: Coccyx  Location Orientation: Mid  Staging: Stage II -  Partial thickness loss of dermis presenting as a shallow open ulcer with a red, pink wound bed without slough.  Wound Description (Comments):   Present on Admission: Yes    DVT prophylaxis: SQ heparin, SCDs Code Status: full  Family Communication: spoke to 2 sisters, 1 was present and 1 by telephone on 12/06/20.  Disposition: home with home health services  Discharge Diagnoses:  Principal Problem:   AKI (acute kidney injury) ([place]) Active Problems:   Hypothyroidism   Diabetes type 2, uncontrolled ([place])   Current smoker   Mixed hyperlipidemia   Essential hypertension, benign   Acute metabolic encephalopathy   Hypoglycemia  Pressure Injury 10/26/17 Stage II -  Partial thickness loss of dermis presenting as a shallow open ulcer with a red, pink wound bed without slough. (Active)  10/26/17 1809  Location: Coccyx  Location Orientation: Mid  Staging: Stage II -  Partial thickness loss of dermis presenting as a shallow open ulcer with a red, pink wound bed without slough.  Wound Description (Comments):   Present on Admission: Yes     Discharge Instructions: Discharge Instructions     AMB referral to wound care center   Complete by: As directed    Ambulatory referral to [place]   Complete by: As directed       Allergies as of 12/06/2020       Reactions   Lisinopril Other (See Comments)   Hyperkalemia / Renal failure        Medication List     STOP taking these medications    Insulin Pen Needle 31G X 5 MM Misc   Lantus SoloStar 100 UNIT/ML Solostar Pen Generic drug: insulin glargine   metoprolol succinate 100 MG 24 hr tablet Commonly known as: TOPROL-XL   Tegaderm Hydrocolloid Thin Misc   Vitamin  D (Ergocalciferol) 1.25 MG (50000 UNIT) Caps capsule Commonly known as: DRISDOL       TAKE these medications    allopurinol 100 MG tablet Commonly known as: ZYLOPRIM Take 1 tablet (100 mg total) by mouth daily.   aspirin EC 81 MG tablet Take 81 mg by mouth daily.   atorvastatin 40 MG tablet Commonly known as: LIPITOR TAKE 1 TABLET BY MOUTH ONCE DAILY AT 6 PM   blood glucose meter kit and supplies Dispense based on patient and insurance preference. Use up to four times daily as directed. (FOR ICD-10 E10.9, E11.9).   cetirizine 10 MG tablet Commonly known as: ZYRTEC Take 1 tablet (10 mg total) by mouth daily as needed for allergies. For allergy symptoms What changed:  when to take this reasons to take this   feeding supplement Liqd Take 237 mLs by mouth 2 (two) times daily between meals.   folic acid 1 MG tablet Commonly known as: FOLVITE Take 1 tablet (1 mg total) by  mouth daily.   glucose blood test strip Commonly known as: Accu-Chek Aviva Plus Check blood sugars four times a day   levothyroxine 75 MCG tablet Commonly known as: SYNTHROID Take 1 tablet (75 mcg total) by mouth daily before breakfast.   megestrol 400 MG/10ML suspension Commonly known as: MEGACE Take 10 mLs (400 mg total) by mouth 2 (two) times daily. For appetite stimulation   sertraline 50 MG tablet Commonly known as: ZOLOFT Take 1 tablet (50 mg total) by mouth at bedtime. What changed: See the new instructions.   sodium bicarbonate 650 MG tablet Take 1 tablet (650 mg total) by mouth 3 (three) times daily.        Follow-up Information     [name], MD Follow up.   Specialty: Family Medicine Contact information: [place] [address] [phone]         [name], MD .   Specialty: Cardiology Contact information: [place] [address] [phone]                Allergies  Allergen Reactions   Lisinopril Other (See  Comments)    Hyperkalemia / Renal failure   Allergies as of 12/06/2020       Reactions   Lisinopril Other (See Comments)   Hyperkalemia / Renal failure        Medication List     STOP taking these medications    Insulin Pen Needle 31G X 5 MM Misc   Lantus SoloStar 100 UNIT/ML Solostar Pen Generic drug: insulin glargine   metoprolol succinate 100 MG 24 hr tablet Commonly known as: TOPROL-XL   Tegaderm Hydrocolloid Thin Misc   Vitamin D (Ergocalciferol) 1.25 MG (50000 UNIT) Caps capsule Commonly known as: DRISDOL       TAKE these medications    allopurinol 100 MG tablet Commonly known as: ZYLOPRIM Take 1 tablet (100 mg total) by mouth daily.   aspirin EC 81 MG tablet Take 81 mg by mouth daily.   atorvastatin 40 MG tablet Commonly known as: LIPITOR TAKE 1 TABLET BY MOUTH ONCE DAILY AT 6 PM   blood glucose meter kit and supplies Dispense based on patient and insurance preference. Use up to four times daily as directed. (FOR ICD-10 E10.9, E11.9).   cetirizine 10 MG tablet Commonly known as: ZYRTEC Take 1 tablet (10 mg total) by mouth daily as needed for allergies. For allergy symptoms What changed:  when to take this reasons to take this   feeding supplement Liqd Take 237 mLs by mouth 2 (two) times daily between meals.   folic acid 1 MG tablet Commonly known as: FOLVITE Take 1 tablet (1 mg total) by mouth daily.   glucose blood test strip Commonly known as: Accu-Chek Aviva Plus Check blood sugars four times a day   levothyroxine 75 MCG tablet Commonly known as: SYNTHROID Take 1 tablet (75 mcg total) by mouth daily before breakfast.   megestrol 400 MG/10ML suspension Commonly known as: MEGACE Take 10 mLs (400 mg total) by mouth 2 (two) times daily. For appetite stimulation   sertraline 50 MG tablet Commonly known as: ZOLOFT Take 1 tablet (50 mg total) by mouth at bedtime. What changed: See the new instructions.   sodium bicarbonate 650 MG  tablet Take 1 tablet (650 mg total) by mouth 3 (three) times daily.        Procedures/Studies: DG Chest Portable 1 View  Result Date: 12/04/2020 CLINICAL DATA:  Infection, near syncope  with generalized weakness. EXAM: PORTABLE CHEST 1 VIEW COMPARISON:  February 04, 2018. FINDINGS: EKG leads projecting over the chest. Cardiomediastinal contours and hilar structures are stable. Calcified atheromatous plaque of the thoracic aorta. Signs of thyroidectomy at the thoracic inlet. Lungs are clear. No signs of pneumothorax.  No effusion. On limited assessment no acute skeletal process. IMPRESSION: No acute cardiopulmonary disease. Electronically Signed   By: Zetta Bills M.D.   On: 12/04/2020 21:18     Subjective: Pt without specific complaints, he is eating and drinking with no difficulties, no specific complaints.    Discharge Exam: Vitals:   12/05/20 2130 12/06/20 0415  BP: 100/61 102/64  Pulse: 66 (!) 48  Resp: 18 18  Temp: 98.3 F (36.8 C) 98.9 F (37.2 C)  SpO2: 92% 92%   Vitals:   12/05/20 0904 12/05/20 1521 12/05/20 2130 12/06/20 0415  BP: 126/75 126/80 100/61 102/64  Pulse: 67 67 66 (!) 48  Resp: 15 16 18 18   Temp: 98 F (36.7 C) 97.6 F (36.4 C) 98.3 F (36.8 C) 98.9 F (37.2 C)  TempSrc: Oral Oral    SpO2: 97% 92% 92% 92%  Weight:       General exam: Appears calm and comfortable Respiratory system: Clear to auscultation. Respiratory effort normal. Cardiovascular system: normal S1 & S2 heard. No JVD, murmurs, rubs, gallops or clicks. No pedal edema. Gastrointestinal system: Abdomen is nondistended, soft and nontender. No organomegaly or masses felt. Normal bowel sounds heard. Central nervous system: Alert and oriented. No focal neurological deficits. Extremities: bilateral AKA. Skin: stage 1 sacral pressure wound  Psychiatry: Judgement and insight appear poor. Mood & affect flat.   The results of significant diagnostics from this hospitalization (including imaging,  microbiology, ancillary and laboratory) are listed below for reference.     Microbiology: Recent Results (from the past 240 hour(s))  Resp Panel by RT-PCR (Flu A&B, Covid) Nasopharyngeal Swab     Status: None   Collection Time: 12/04/20  8:55 PM   Specimen: Nasopharyngeal Swab; Nasopharyngeal(NP) swabs in vial transport medium  Result Value Ref Range Status   SARS Coronavirus 2 by RT PCR NEGATIVE NEGATIVE Final    Comment: (NOTE) SARS-CoV-2 target nucleic acids are NOT DETECTED.  The SARS-CoV-2 RNA is generally detectable in upper respiratory specimens during the acute phase of infection. The lowest concentration of SARS-CoV-2 viral copies this assay can detect is 138 copies/mL. A negative result does not preclude SARS-Cov-2 infection and should not be used as the sole basis for treatment or other patient management decisions. A negative result may occur with  improper specimen collection/handling, submission of specimen other than nasopharyngeal swab, presence of viral mutation(s) within the areas targeted by this assay, and inadequate number of viral copies(<138 copies/mL). A negative result must be combined with clinical observations, patient history, and epidemiological information. The expected result is Negative.  Fact Sheet for Patients:  EntrepreneurPulse.com.au  Fact Sheet for Healthcare Providers:  IncredibleEmployment.be  This test is no t yet approved or cleared by the [place] FDA and  has been authorized for detection and/or diagnosis of SARS-CoV-2 by FDA under an Emergency Use Authorization (EUA). This EUA will remain  in effect (meaning this test can be used) for the duration of the COVID-19 declaration under Section 564(b)(1) of the Act, 21 U.S.C.section 360bbb-3(b)(1), unless the authorization is terminated  or revoked sooner.       Influenza A by PCR NEGATIVE NEGATIVE Final   Influenza B by PCR NEGATIVE NEGATIVE  Final    Comment: (NOTE) The Xpert Xpress SARS-CoV-2/FLU/RSV plus assay is intended as an aid in the diagnosis of influenza from Nasopharyngeal swab specimens and should not be used as a sole basis for treatment. Nasal washings and aspirates are unacceptable for Xpert Xpress SARS-CoV-2/FLU/RSV testing.  Fact Sheet for Patients: EntrepreneurPulse.com.au  Fact Sheet for Healthcare Providers: IncredibleEmployment.be  This test is not yet approved or cleared by the [place] FDA and has been authorized for detection and/or diagnosis of SARS-CoV-2 by FDA under an Emergency Use Authorization (EUA). This EUA will remain in effect (meaning this test can be used) for the duration of the COVID-19 declaration under Section 564(b)(1) of the Act, 21 U.S.C. section 360bbb-3(b)(1), unless the authorization is terminated or revoked.  Performed at [facility], [address], [place], [place] [address]   MRSA Next Gen by PCR, Nasal     Status: Abnormal   Collection Time: 12/05/20  1:11 AM   Specimen: Nasal Mucosa; Nasal Swab  Result Value Ref Range Status   MRSA by PCR Next Gen DETECTED (A) NOT DETECTED Final    Comment: RESULT CALLED TO, READ BACK BY AND VERIFIED WITH: KINDLEY,C ON 12/05/20 BY JUW (NOTE) The GeneXpert MRSA Assay (FDA approved for NASAL specimens only), is one component of a comprehensive MRSA colonization surveillance program. It is not intended to diagnose MRSA infection nor to guide or monitor treatment for MRSA infections. Test performance is not FDA approved in patients less than 66 years old. Performed at [facility], [address] [place], [place] [address]      Labs: BNP (last 3 results) No results for input(s): BNP in the last 8760 hours. Basic Metabolic Panel: Recent Labs  Lab 12/04/20 2041 12/05/20 0412 12/06/20 0600  NA 133* 135 132*  K 3.7 3.1* 3.3*  CL 103 108 110  CO2 18* 17* 18*  GLUCOSE 132* 94 96  BUN 28*  27* 16  CREATININE 2.08* 1.72* 1.19  CALCIUM 8.3* 7.8* 6.9*  MG 1.7  --  1.8   Liver Function Tests: Recent Labs  Lab 12/05/20 0412  AST 14*  ALT 8  ALKPHOS 70  BILITOT 2.1*  PROT 7.0  ALBUMIN 2.1*   No results for input(s): LIPASE, AMYLASE in the last 168 hours. No results for input(s): AMMONIA in the last 168 hours. CBC: Recent Labs  Lab 12/04/20 2041 12/05/20 0412 12/06/20 0600  WBC 9.6 8.9 7.2  NEUTROABS 7.0 6.3  --   HGB 10.2* 10.3* 9.1*  HCT 32.0* 32.8* 28.9*  MCV 85.3 85.4 87.0  PLT 380 368 328   Cardiac Enzymes: No results for input(s): CKTOTAL, CKMB, CKMBINDEX, TROPONINI in the last 168 hours. BNP: Invalid input(s): POCBNP CBG: Recent Labs  Lab 12/05/20 1209 12/05/20 1648 12/05/20 2133 12/06/20 0313 12/06/20 0731  GLUCAP 105* 86 76 107* 93   D-Dimer No results for input(s): DDIMER in the last 72 hours. Hgb A1c Recent Labs    12/04/20 2041  HGBA1C 6.0*   Lipid Profile No results for input(s): CHOL, HDL, LDLCALC, TRIG, CHOLHDL, LDLDIRECT in the last 72 hours. Thyroid function studies Recent Labs    12/05/20 0412  TSH 28.476*   Anemia work up No results for input(s): VITAMINB12, FOLATE, FERRITIN, TIBC, IRON, RETICCTPCT in the last 72 hours. Urinalysis    Component Value Date/Time   COLORURINE AMBER (A) 12/04/2020 2300   APPEARANCEUR CLEAR 12/04/2020 2300   APPEARANCEUR Clear 02/09/2018 1352   LABSPEC 1.015 12/04/2020 2300   PHURINE 5.0  12/04/2020 2300   GLUCOSEU NEGATIVE 12/04/2020 2300   HGBUR SMALL (A) 12/04/2020 2300   BILIRUBINUR SMALL (A) 12/04/2020 2300   BILIRUBINUR Negative 02/09/2018 1352   KETONESUR 5 (A) 12/04/2020 2300   PROTEINUR NEGATIVE 12/04/2020 2300   NITRITE NEGATIVE 12/04/2020 2300   LEUKOCYTESUR MODERATE (A) 12/04/2020 2300   Sepsis Labs Invalid input(s): PROCALCITONIN,  WBC,  LACTICIDVEN Microbiology Recent Results (from the past 240 hour(s))  Resp Panel by RT-PCR (Flu A&B, Covid) Nasopharyngeal Swab      Status: None   Collection Time: 12/04/20  8:55 PM   Specimen: Nasopharyngeal Swab; Nasopharyngeal(NP) swabs in vial transport medium  Result Value Ref Range Status   SARS Coronavirus 2 by RT PCR NEGATIVE NEGATIVE Final    Comment: (NOTE) SARS-CoV-2 target nucleic acids are NOT DETECTED.  The SARS-CoV-2 RNA is generally detectable in upper respiratory specimens during the acute phase of infection. The lowest concentration of SARS-CoV-2 viral copies this assay can detect is 138 copies/mL. A negative result does not preclude SARS-Cov-2 infection and should not be used as the sole basis for treatment or other patient management decisions. A negative result may occur with  improper specimen collection/handling, submission of specimen other than nasopharyngeal swab, presence of viral mutation(s) within the areas targeted by this assay, and inadequate number of viral copies(<138 copies/mL). A negative result must be combined with clinical observations, patient history, and epidemiological information. The expected result is Negative.  Fact Sheet for Patients:  EntrepreneurPulse.com.au  Fact Sheet for Healthcare Providers:  IncredibleEmployment.be  This test is no t yet approved or cleared by the [place] FDA and  has been authorized for detection and/or diagnosis of SARS-CoV-2 by FDA under an Emergency Use Authorization (EUA). This EUA will remain  in effect (meaning this test can be used) for the duration of the COVID-19 declaration under Section 564(b)(1) of the Act, 21 U.S.C.section 360bbb-3(b)(1), unless the authorization is terminated  or revoked sooner.       Influenza A by PCR NEGATIVE NEGATIVE Final   Influenza B by PCR NEGATIVE NEGATIVE Final    Comment: (NOTE) The Xpert Xpress SARS-CoV-2/FLU/RSV plus assay is intended as an aid in the diagnosis of influenza from Nasopharyngeal swab specimens and should not be used as a sole basis  for treatment. Nasal washings and aspirates are unacceptable for Xpert Xpress SARS-CoV-2/FLU/RSV testing.  Fact Sheet for Patients: EntrepreneurPulse.com.au  Fact Sheet for Healthcare Providers: IncredibleEmployment.be  This test is not yet approved or cleared by the [place] FDA and has been authorized for detection and/or diagnosis of SARS-CoV-2 by FDA under an Emergency Use Authorization (EUA). This EUA will remain in effect (meaning this test can be used) for the duration of the COVID-19 declaration under Section 564(b)(1) of the Act, 21 U.S.C. section 360bbb-3(b)(1), unless the authorization is terminated or revoked.  Performed at [facility], [address] [place], [place] [address]   MRSA Next Gen by PCR, Nasal     Status: Abnormal   Collection Time: 12/05/20  1:11 AM   Specimen: Nasal Mucosa; Nasal Swab  Result Value Ref Range Status   MRSA by PCR Next Gen DETECTED (A) NOT DETECTED Final    Comment: RESULT CALLED TO, READ BACK BY AND VERIFIED WITH: KINDLEY,C ON 12/05/20 BY JUW (NOTE) The GeneXpert MRSA Assay (FDA approved for NASAL specimens only), is one component of a comprehensive MRSA colonization surveillance program. It is not intended to diagnose MRSA infection nor to guide or monitor treatment for MRSA infections.  Test performance is not FDA approved in patients less than 42 years old. Performed at [facility], [address], [place], [place] [address]    Time coordinating discharge:   SIGNED:  Irwin Brakeman, MD  [facility] 12/06/2020, 10:59 AM How to contact the [facility] Attending or Consulting provider [place] or covering provider during after hours [place], for this patient?  Check the care team in [facility] and look for a) attending/consulting [facility] provider listed and b) the [facility] team listed Log into [email or web address] and use [place]'s universal password to access. If you do not have the password, please contact  the hospital operator. Locate the [facility] provider you are looking for under [facility] and page to a number that you can be directly reached. If you still have difficulty reaching the provider, please page the [facility] (Director on Call) for the Hospitalists listed on amion for assistance.

## 2020-12-06 NOTE — Progress Notes (Signed)
PT Cancellation Note  Patient Details Name: Todd [name] MRN: [id number] DOB: [birth year]   Cancelled Treatment:    Reason Eval/Treat Not Completed: Patient declined, no reason specified Patient initially agreeable to Physical therapy but then declined after he realized he needed to move. Discussed importance of increasing overall mobility to reduce continued weakness and to alleviate pressure on buttocks. Patient continued to decline therapy. Will reattempt again tomorrow if appropriate.   12:53 PM, 12/06/20 Jerene Pitch, DPT Physical Therapy with [facility]  [phone] office

## 2020-12-07 ENCOUNTER — Ambulatory Visit: Payer: Medicare HMO | Admitting: Nurse Practitioner

## 2020-12-07 ENCOUNTER — Telehealth: Payer: Self-pay | Admitting: Family Medicine

## 2020-12-07 LAB — GLUCOSE, CAPILLARY: Glucose-Capillary: 27 mg/dL — CL (ref 70–99)

## 2020-12-07 NOTE — Telephone Encounter (Signed)
Patient did not show for appt. Called sister, [name]. NA and mailbox full

## 2020-12-09 ENCOUNTER — Telehealth: Payer: Medicare HMO

## 2020-12-11 DIAGNOSIS — E049 Nontoxic goiter, unspecified: Secondary | ICD-10-CM | POA: Diagnosis not present

## 2020-12-11 DIAGNOSIS — E039 Hypothyroidism, unspecified: Secondary | ICD-10-CM | POA: Diagnosis not present

## 2020-12-11 DIAGNOSIS — G9341 Metabolic encephalopathy: Secondary | ICD-10-CM | POA: Diagnosis not present

## 2020-12-11 DIAGNOSIS — E119 Type 2 diabetes mellitus without complications: Secondary | ICD-10-CM | POA: Diagnosis not present

## 2020-12-11 DIAGNOSIS — F17210 Nicotine dependence, cigarettes, uncomplicated: Secondary | ICD-10-CM | POA: Diagnosis not present

## 2020-12-11 DIAGNOSIS — I10 Essential (primary) hypertension: Secondary | ICD-10-CM | POA: Diagnosis not present

## 2020-12-11 DIAGNOSIS — E782 Mixed hyperlipidemia: Secondary | ICD-10-CM | POA: Diagnosis not present

## 2020-12-11 DIAGNOSIS — L89152 Pressure ulcer of sacral region, stage 2: Secondary | ICD-10-CM | POA: Diagnosis not present

## 2020-12-11 DIAGNOSIS — M109 Gout, unspecified: Secondary | ICD-10-CM | POA: Diagnosis not present

## 2020-12-15 DIAGNOSIS — L89152 Pressure ulcer of sacral region, stage 2: Secondary | ICD-10-CM | POA: Diagnosis not present

## 2020-12-15 DIAGNOSIS — E119 Type 2 diabetes mellitus without complications: Secondary | ICD-10-CM | POA: Diagnosis not present

## 2020-12-15 DIAGNOSIS — F17210 Nicotine dependence, cigarettes, uncomplicated: Secondary | ICD-10-CM | POA: Diagnosis not present

## 2020-12-15 DIAGNOSIS — E039 Hypothyroidism, unspecified: Secondary | ICD-10-CM | POA: Diagnosis not present

## 2020-12-15 DIAGNOSIS — I10 Essential (primary) hypertension: Secondary | ICD-10-CM | POA: Diagnosis not present

## 2020-12-15 DIAGNOSIS — M109 Gout, unspecified: Secondary | ICD-10-CM | POA: Diagnosis not present

## 2020-12-15 DIAGNOSIS — E782 Mixed hyperlipidemia: Secondary | ICD-10-CM | POA: Diagnosis not present

## 2020-12-15 DIAGNOSIS — G9341 Metabolic encephalopathy: Secondary | ICD-10-CM | POA: Diagnosis not present

## 2020-12-15 DIAGNOSIS — E049 Nontoxic goiter, unspecified: Secondary | ICD-10-CM | POA: Diagnosis not present

## 2020-12-17 ENCOUNTER — Encounter: Payer: Medicare HMO | Attending: Physician Assistant | Admitting: Physician Assistant

## 2020-12-17 ENCOUNTER — Other Ambulatory Visit: Payer: Self-pay

## 2020-12-17 DIAGNOSIS — L89313 Pressure ulcer of right buttock, stage 3: Secondary | ICD-10-CM | POA: Insufficient documentation

## 2020-12-17 DIAGNOSIS — I120 Hypertensive chronic kidney disease with stage 5 chronic kidney disease or end stage renal disease: Secondary | ICD-10-CM | POA: Diagnosis not present

## 2020-12-17 DIAGNOSIS — E11622 Type 2 diabetes mellitus with other skin ulcer: Secondary | ICD-10-CM | POA: Diagnosis not present

## 2020-12-17 DIAGNOSIS — E1151 Type 2 diabetes mellitus with diabetic peripheral angiopathy without gangrene: Secondary | ICD-10-CM | POA: Insufficient documentation

## 2020-12-17 DIAGNOSIS — Z89612 Acquired absence of left leg above knee: Secondary | ICD-10-CM | POA: Insufficient documentation

## 2020-12-17 DIAGNOSIS — Z89611 Acquired absence of right leg above knee: Secondary | ICD-10-CM | POA: Diagnosis not present

## 2020-12-17 DIAGNOSIS — N186 End stage renal disease: Secondary | ICD-10-CM | POA: Diagnosis not present

## 2020-12-17 DIAGNOSIS — E440 Moderate protein-calorie malnutrition: Secondary | ICD-10-CM | POA: Insufficient documentation

## 2020-12-17 DIAGNOSIS — Z888 Allergy status to other drugs, medicaments and biological substances status: Secondary | ICD-10-CM | POA: Diagnosis not present

## 2020-12-17 DIAGNOSIS — E1122 Type 2 diabetes mellitus with diabetic chronic kidney disease: Secondary | ICD-10-CM | POA: Diagnosis not present

## 2020-12-17 DIAGNOSIS — F17200 Nicotine dependence, unspecified, uncomplicated: Secondary | ICD-10-CM | POA: Insufficient documentation

## 2020-12-17 DIAGNOSIS — R6889 Other general symptoms and signs: Secondary | ICD-10-CM | POA: Diagnosis not present

## 2020-12-18 DIAGNOSIS — L89152 Pressure ulcer of sacral region, stage 2: Secondary | ICD-10-CM | POA: Diagnosis not present

## 2020-12-18 DIAGNOSIS — E782 Mixed hyperlipidemia: Secondary | ICD-10-CM | POA: Diagnosis not present

## 2020-12-18 DIAGNOSIS — E119 Type 2 diabetes mellitus without complications: Secondary | ICD-10-CM | POA: Diagnosis not present

## 2020-12-18 DIAGNOSIS — G9341 Metabolic encephalopathy: Secondary | ICD-10-CM | POA: Diagnosis not present

## 2020-12-18 DIAGNOSIS — I10 Essential (primary) hypertension: Secondary | ICD-10-CM | POA: Diagnosis not present

## 2020-12-18 DIAGNOSIS — E039 Hypothyroidism, unspecified: Secondary | ICD-10-CM | POA: Diagnosis not present

## 2020-12-18 DIAGNOSIS — E049 Nontoxic goiter, unspecified: Secondary | ICD-10-CM | POA: Diagnosis not present

## 2020-12-18 DIAGNOSIS — M109 Gout, unspecified: Secondary | ICD-10-CM | POA: Diagnosis not present

## 2020-12-18 DIAGNOSIS — F17210 Nicotine dependence, cigarettes, uncomplicated: Secondary | ICD-10-CM | POA: Diagnosis not present

## 2020-12-18 NOTE — Progress Notes (Addendum)
DARA, CAMARGO ([id number]) Visit Report for 12/17/2020 Allergy List Details Patient Name: Todd [name], Todd [name] Date of Service: 12/17/2020 12:45 PM Medical Record Number: [id number] Patient Account Number: [id number] Date of Birth/Sex: Oct 14, 1955 (65 y.o. Male) Treating RN: Carlene Coria Primary Care Zakyah Yanes: [name] Other Clinician: Referring Kayler Buckholtz: [name] Treating Tracer Gutridge/Extender: Skipper Cliche in Treatment: 0 Allergies Active Allergies lisinopril Allergy Notes Electronic Signature(s) Signed: 12/17/2020 10:01:55 PM By: Carlene Coria RN Entered By: Carlene Coria on 12/17/2020 13:26:23 STEVENSON, WINDMILLER ([id number]) -------------------------------------------------------------------------------- [facility] Details Patient Name: Todd [name] Date of Service: 12/17/2020 12:45 PM Medical Record Number: [id number] Patient Account Number: [id number] Date of Birth/Sex: 1955/08/18 (65 y.o. Male) Treating RN: Carlene Coria Primary Care Johnnie Moten: [name] Other Clinician: Referring Antavious Spanos: [name] Treating Zniya Cottone/Extender: Skipper Cliche in Treatment: 0 Visit Information Patient Arrived: Wheel Chair Arrival Time: 13:15 Accompanied By: self Transfer Assistance: None Patient Identification Verified: Yes Secondary Verification Process Completed: Yes Patient Requires Transmission-Based Precautions: No Patient Has Alerts: No Electronic Signature(s) Signed: 12/17/2020 10:01:55 PM By: Carlene Coria RN Entered By: Carlene Coria on 12/17/2020 13:15:15 Todd [name] ([id number]) -------------------------------------------------------------------------------- Clinic Level of Care Assessment Details Patient Name: Todd [name] Date of Service: 12/17/2020 12:45 PM Medical Record Number: [id number] Patient Account Number: [id number] Date of Birth/Sex: 1955-09-28 (65 y.o. Male) Treating RN: Carlene Coria Primary Care Anesa Fronek: [name] Other  Clinician: Referring Romar Woodrick: [name] Treating Nayara Taplin/Extender: Skipper Cliche in Treatment: 0 Clinic Level of Care Assessment Items TOOL 4 Quantity Score X - Use when only an EandM is performed on FOLLOW-UP visit 1 0 ASSESSMENTS - Nursing Assessment / Reassessment X - Reassessment of Co-morbidities (includes updates in patient status) 1 10 X- 1 5 Reassessment of Adherence to Treatment Plan ASSESSMENTS - Wound and Skin Assessment / Reassessment X - Simple Wound Assessment / Reassessment - one wound 1 5 []  - 0 Complex Wound Assessment / Reassessment - multiple wounds []  - 0 Dermatologic / Skin Assessment (not related to wound area) ASSESSMENTS - Focused Assessment []  - Circumferential Edema Measurements - multi extremities 0 []  - 0 Nutritional Assessment / Counseling / Intervention []  - 0 Lower Extremity Assessment (monofilament, tuning fork, pulses) []  - 0 Peripheral Arterial Disease Assessment (using hand held doppler) ASSESSMENTS - Ostomy and/or Continence Assessment and Care []  - Incontinence Assessment and Management 0 []  - 0 Ostomy Care Assessment and Management (repouching, etc.) PROCESS - Coordination of Care X - Simple Patient / Family Education for ongoing care 1 15 []  - 0 Complex (extensive) Patient / Family Education for ongoing care X- 1 10 Staff obtains Programmer, systems, Records, Test Results / Process Orders []  - 0 Staff telephones HHA, Nursing Homes / Clarify orders / etc []  - 0 Routine Transfer to another Facility (non-emergent condition) []  - 0 Routine Hospital Admission (non-emergent condition) X- 1 15 New Admissions / Biomedical engineer / Ordering NPWT, Apligraf, etc. []  - 0 Emergency Hospital Admission (emergent condition) X- 1 10 Simple Discharge Coordination []  - 0 Complex (extensive) Discharge Coordination PROCESS - Special Needs []  - Pediatric / Minor Patient Management 0 []  - 0 Isolation Patient Management []  - 0 Hearing /  Language / Visual special needs []  - 0 Assessment of Community assistance (transportation, D/C planning, etc.) []  - 0 Additional assistance / Altered mentation []  - 0 Support Surface(s) Assessment (bed, cushion, seat, etc.) INTERVENTIONS - Wound Cleansing / Measurement [place], Sandra ([id number]) X- 1 5 Simple Wound Cleansing - one wound []  - 0 Complex Wound Cleansing - multiple wounds X- 1  5 Wound Imaging (photographs - any number of wounds) []  - 0 Wound Tracing (instead of photographs) X- 1 5 Simple Wound Measurement - one wound []  - 0 Complex Wound Measurement - multiple wounds INTERVENTIONS - Wound Dressings X - Small Wound Dressing one or multiple wounds 1 10 []  - 0 Medium Wound Dressing one or multiple wounds []  - 0 Large Wound Dressing one or multiple wounds X- 1 5 Application of Medications - topical []  - 0 Application of Medications - injection INTERVENTIONS - Miscellaneous []  - External ear exam 0 []  - 0 Specimen Collection (cultures, biopsies, blood, body fluids, etc.) []  - 0 Specimen(s) / Culture(s) sent or taken to Lab for analysis []  - 0 Patient Transfer (multiple staff / Civil Service fast streamer / Similar devices) []  - 0 Simple Staple / Suture removal (25 or less) []  - 0 Complex Staple / Suture removal (26 or more) []  - 0 Hypo / Hyperglycemic Management (close monitor of Blood Glucose) []  - 0 Ankle / Brachial Index (ABI) - do not check if billed separately X- 1 5 Vital Signs Has the patient been seen at the hospital within the last three years: Yes Total Score: 105 Level Of Care: New/Established - Level 3 Electronic Signature(s) Signed: 12/17/2020 10:01:55 PM By: Carlene Coria RN Entered By: Carlene Coria on 12/17/2020 14:04:59 Todd [name] ([id number]) -------------------------------------------------------------------------------- Encounter Discharge Information Details Patient Name: Todd [name] Date of Service: 12/17/2020 12:45 PM Medical Record  Number: [id number] Patient Account Number: [id number] Date of Birth/Sex: Oct 05, 1955 (65 y.o. Male) Treating RN: Carlene Coria Primary Care Jasmen Emrich: [name] Other Clinician: Referring Nickalas Mccarrick: [name] Treating Emberlin Verner/Extender: Skipper Cliche in Treatment: 0 Encounter Discharge Information Items Discharge Condition: Stable Ambulatory Status: Wheelchair Discharge Destination: Home Transportation: Other Accompanied By: self Schedule Follow-up Appointment: Yes Clinical Summary of Care: Patient Declined Electronic Signature(s) Signed: 12/17/2020 10:01:55 PM By: Carlene Coria RN Entered By: Carlene Coria on 12/17/2020 [place], [place] ([id number]) -------------------------------------------------------------------------------- Lower Extremity Assessment Details Patient Name: Todd [name] Date of Service: 12/17/2020 12:45 PM Medical Record Number: [id number] Patient Account Number: [id number] Date of Birth/Sex: 14-Nov-1955 (65 y.o. Male) Treating RN: Carlene Coria Primary Care Demontrez Rindfleisch: [name] Other Clinician: Referring Osby Sweetin: [name] Treating Katrinka Herbison/Extender: Skipper Cliche in Treatment: 0 Electronic Signature(s) Signed: 12/17/2020 10:01:55 PM By: Carlene Coria RN Entered By: Carlene Coria on 12/17/2020 13:37:19 Boykin, [name] ([id number]) -------------------------------------------------------------------------------- Multi Wound Chart Details Patient Name: Todd [name] Date of Service: 12/17/2020 12:45 PM Medical Record Number: [id number] Patient Account Number: [id number] Date of Birth/Sex: Aug 25, 1955 (65 y.o. Male) Treating RN: Carlene Coria Primary Care Eliz Nigg: [name] Other Clinician: Referring Palmina Clodfelter: [name] Treating Karol Liendo/Extender: Skipper Cliche in Treatment: 0 Vital Signs Height(in): 68 Capillary Blood Glucose 121 (mg/dl): Weight(lbs): 135 Pulse(bpm): 73 Body Mass Index(BMI): 21 Blood  Pressure(mmHg): 125/80 Temperature(F): 98.4 Respiratory Rate(breaths/min): 16 Photos: [N/A:N/A] Wound Location: Right Gluteus N/A N/A Wounding Event: Pressure Injury N/A N/A Primary Etiology: Pressure Ulcer N/A N/A Comorbid History: Hypertension, Type II Diabetes, End N/A N/A Stage Renal Disease Date Acquired: 10/11/2020 N/A N/A Weeks of Treatment: 0 N/A N/A Wound Status: Open N/A N/A Measurements L x W x D (cm) 0.8x0.6x0.1 N/A N/A Area (cm) : 0.377 N/A N/A Volume (cm) : 0.038 N/A N/A Classification: Category/Stage III N/A N/A Exudate Amount: None Present N/A N/A Granulation Amount: Medium (34-66%) N/A N/A Granulation Quality: Red N/A N/A Necrotic Amount: Medium (34-66%) N/A N/A Exposed Structures: Fat Layer (Subcutaneous Tissue): N/A N/A Yes Fascia: No Tendon: No Muscle: No Joint: No Bone:  No Epithelialization: None N/A N/A Treatment Notes Electronic Signature(s) Signed: 12/17/2020 10:01:55 PM By: Carlene Coria RN Entered By: Carlene Coria on 12/17/2020 14:01:30 Todd [name] ([id number]) -------------------------------------------------------------------------------- Multi-Disciplinary Care Plan Details Patient Name: Todd [name] Date of Service: 12/17/2020 12:45 PM Medical Record Number: [id number] Patient Account Number: [id number] Date of Birth/Sex: 02-02-1956 (65 y.o. Male) Treating RN: Carlene Coria Primary Care Evian Derringer: [name] Other Clinician: Referring Boluwatife Mutchler: [name] Treating Grover Woodfield/Extender: Skipper Cliche in Treatment: 0 Active Inactive Electronic Signature(s) Signed: 02/10/2021 8:56:17 AM By: Gretta Cool, BSN, RN, CWS, Kim RN, BSN Signed: 02/24/2021 7:55:47 AM By: Carlene Coria RN Previous Signature: 12/17/2020 10:01:55 PM Version By: Carlene Coria RN Entered By: Gretta Cool, BSN, RN, CWS, Kim on 02/10/2021 08:56:16 [place], [name] ([id number]) -------------------------------------------------------------------------------- Pain Assessment  Details Patient Name: Todd [name] Date of Service: 12/17/2020 12:45 PM Medical Record Number: [id number] Patient Account Number: [id number] Date of Birth/Sex: 1955-08-31 (65 y.o. Male) Treating RN: Carlene Coria Primary Care Ilia Engelbert: [name] Other Clinician: Referring Adonay Scheier: [name] Treating Michaila Kenney/Extender: Skipper Cliche in Treatment: 0 Active Problems Location of Pain Severity and Description of Pain Patient Has Paino No Site Locations Pain Management and Medication Current Pain Management: Electronic Signature(s) Signed: 12/17/2020 10:01:55 PM By: Carlene Coria RN Entered By: Carlene Coria on 12/17/2020 13:15:24 Todd [name] ([id number]) -------------------------------------------------------------------------------- Patient/Caregiver Education Details Patient Name: Todd [name] Date of Service: 12/17/2020 12:45 PM Medical Record Number: [id number] Patient Account Number: [id number] Date of Birth/Gender: 10-23-1955 (65 y.o. Male) Treating RN: Carlene Coria Primary Care Physician: [name] Other Clinician: Referring Physician: [name] Treating Physician/Extender: Skipper Cliche in Treatment: 0 Education Assessment Education Provided To: Patient Education Topics Provided Electronic Signature(s) Signed: 12/17/2020 10:01:55 PM By: Carlene Coria RN Entered By: Carlene Coria on 12/17/2020 14:05:36 Todd [name] ([id number]) -------------------------------------------------------------------------------- Wound Assessment Details Patient Name: Todd [name] Date of Service: 12/17/2020 12:45 PM Medical Record Number: [id number] Patient Account Number: [id number] Date of Birth/Sex: 05/26/1956 (65 y.o. Male) Treating RN: Carlene Coria Primary Care Krisha Beegle: [name] Other Clinician: Referring Kaycee Haycraft: [name] Treating Teion Ballin/Extender: Skipper Cliche in Treatment: 0 Wound Status Wound Number: 1 Primary Etiology:  Pressure Ulcer Wound Location: Right Gluteus Wound Status: Open Wounding Event: Pressure Injury Comorbid Hypertension, Type II Diabetes, End Stage Renal History: Disease Date Acquired: 10/11/2020 Weeks Of Treatment: 0 Clustered Wound: No Photos Wound Measurements Length: (cm) 0.8 Width: (cm) 0.6 Depth: (cm) 0.1 Area: (cm) 0.377 Volume: (cm) 0.038 % Reduction in Area: % Reduction in Volume: Epithelialization: None Tunneling: No Undermining: No Wound Description Classification: Category/Stage III Exudate Amount: None Present Foul Odor After Cleansing: No Slough/Fibrino Yes Wound Bed Granulation Amount: Medium (34-66%) Exposed Structure Granulation Quality: Red Fascia Exposed: No Necrotic Amount: Medium (34-66%) Fat Layer (Subcutaneous Tissue) Exposed: Yes Necrotic Quality: Adherent Slough Tendon Exposed: No Muscle Exposed: No Joint Exposed: No Bone Exposed: No Electronic Signature(s) Signed: 12/17/2020 10:01:55 PM By: Carlene Coria RN Entered By: Carlene Coria on 12/17/2020 13:37:05 Todd [name] ([id number]) -------------------------------------------------------------------------------- Vitals Details Patient Name: Todd [name] Date of Service: 12/17/2020 12:45 PM Medical Record Number: [id number] Patient Account Number: [id number] Date of Birth/Sex: 1956/02/19 (65 y.o. Male) Treating RN: Carlene Coria Primary Care Kutter Schnepf: [name] Other Clinician: Referring Vinita Prentiss: [name] Treating Jodie Cavey/Extender: Skipper Cliche in Treatment: 0 Vital Signs Time Taken: 13:15 Temperature (F): 98.4 Height (in): 68 Pulse (bpm): 73 Source: Stated Respiratory Rate (breaths/min): 16 Weight (lbs): 135 Blood Pressure (mmHg): 125/80 Source: Stated Capillary Blood Glucose (mg/dl): 121 Body Mass Index (BMI): 20.5 Reference Range: 80 - 120  mg / dl Notes CBG per patient Electronic Signature(s) Signed: 12/17/2020 10:01:55 PM By: Carlene Coria RN Entered By:  Carlene Coria on 12/17/2020 13:25:56

## 2020-12-18 NOTE — Progress Notes (Signed)
Todd [name], Todd [name] ([id number]) Visit Report for 12/17/2020 Chief Complaint Document Details Patient Name: Todd [name], Todd [name] Date of Service: 12/17/2020 12:45 PM Medical Record Number: [id number] Patient Account Number: [id number] Date of Birth/Sex: 1955-09-13 (64 y.o. Male) Treating RN: Dolan Amen Primary Care Provider: [name] Other Clinician: Referring Provider: [name] Treating Provider/Extender: Skipper Cliche in Treatment: 0 Information Obtained from: Patient Chief Complaint Pressure ulcer right buttock Electronic Signature(s) Signed: 12/17/2020 1:44:03 PM By: Worthy Keeler PA-C Entered By: Worthy Keeler on 12/17/2020 13:44:03 Todd [name], Todd [name] ([id number]) -------------------------------------------------------------------------------- HPI Details Patient Name: Todd [name] Date of Service: 12/17/2020 12:45 PM Medical Record Number: [id number] Patient Account Number: [id number] Date of Birth/Sex: 26-Apr-1956 (64 y.o. Male) Treating RN: Dolan Amen Primary Care Provider: [name] Other Clinician: Referring Provider: [name] Treating Provider/Extender: Skipper Cliche in Treatment: 0 History of Present Illness HPI Description: 12/17/2020 upon evaluation today patient presents for initial evaluation here in our clinic concerning issues that he has been having with a wound in the right gluteal region. Subsequently the patient does have significant issues he is a bilateral above-knee amputee. He also has some alcohol abuse disorder there is also mention in the notes of cocaine abuse although the patient actually denies that today. With that being said he does have some protein calorie malnutrition that we discussed later. He also has diabetes, hypertension, chronic kidney disease stage III, and hypothyroidism and it is questionable whether or not he is actually taking his medication for that. With all that being said the patient presents today again  with a wound in the right gluteal/ischial location. Subsequently this does appear to be a stage III pressure ulcer but is not too significant at this point which is great news. Nonetheless his history is what is more concerning. With regard to his history of how his day goes he has a aide what sounds like she comes out 3 times a week for roughly 3 hours at most. Does not hours a week total. Other than this he tells me that he is not able to get to the bathroom. He tells [place] that he has to wait for her to come before he is able to get to the toilet. With that being said that means that he is in a brief that is obviously not clean and significantly soiled more often than not. He also tells me that he eats mainly when she is able to fix food for him or is able to leave something for him to get in the microwave. This has me concerned about his nutritional status as well as just his safety but to be perfectly honest. Fortunately there does not appear to be any signs of infection in regard to the wound but again if he is in a soiled brief that could definitely be a significant issue as well. Either way I do not feel like this current situation is ideal nor safe for him to be honest. Electronic Signature(s) Signed: 12/17/2020 2:26:06 PM By: Worthy Keeler PA-C Entered By: Worthy Keeler on 12/17/2020 14:26:05 Todd [name], Todd [name] ([id number]) -------------------------------------------------------------------------------- Physical Exam Details Patient Name: Todd [name] Date of Service: 12/17/2020 12:45 PM Medical Record Number: [id number] Patient Account Number: [id number] Date of Birth/Sex: 08-Apr-1956 (65 y.o. Male) Treating RN: Dolan Amen Primary Care Provider: [name] Other Clinician: Referring Provider: [name] Treating Provider/Extender: Skipper Cliche in Treatment: 0 Constitutional sitting or standing blood pressure is within target range for patient.. pulse regular and  within target range for patient.[name] respirations  regular, non- labored and within target range for patient.[name] temperature within target range for patient.. Thin and well-hydrated in no acute distress. Eyes conjunctiva clear no eyelid edema noted. pupils equal round and reactive to light and accommodation. Ears, Nose, Mouth, and Throat no gross abnormality of ear auricles or external auditory canals. normal hearing noted during conversation. mucus membranes moist. Respiratory normal breathing without difficulty. Psychiatric this patient is able to make decisions and demonstrates good insight into disease process. Alert and Oriented x 3. pleasant and cooperative. Notes Upon inspection patient's wound bed actually showed signs of good granulation epithelization in regard to his wound I do not see any evidence of infection right now which is great news as well. With that being said the patient does have bilateral above-knee amputations. Everything is healed and doing fine in that regard but this does limit his ability to be able to get around in general. Obviously this is of concern and I think is the biggest issue as far as his home care situation is concerned. I do believe he needs significantly more help than what he is able to achieve just through the current home health services he has. Electronic Signature(s) Signed: 12/17/2020 2:26:58 PM By: Worthy Keeler PA-C Entered By: Worthy Keeler on 12/17/2020 14:26:58 Todd [name], Todd [name] ([id number]) -------------------------------------------------------------------------------- Physician Orders Details Patient Name: Todd [name] Date of Service: 12/17/2020 12:45 PM Medical Record Number: [id number] Patient Account Number: [id number] Date of Birth/Sex: 06-06-1956 (65 y.o. Male) Treating RN: Carlene Coria Primary Care Provider: [name] Other Clinician: Referring Provider: [name] Treating Provider/Extender: Skipper Cliche in  Treatment: 0 Verbal / Phone Orders: No Diagnosis Coding ICD-10 Coding Code Description L89.313 Pressure ulcer of right buttock, stage 3 F10.188 Alcohol abuse with other alcohol-induced disorder Z89.611 Acquired absence of right leg above knee Z89.612 Acquired absence of left leg above knee E44.0 Moderate protein-calorie malnutrition E11.622 Type 2 diabetes mellitus with other skin ulcer I10 Essential (primary) hypertension N18.30 Chronic kidney disease, stage 3 unspecified E03.8 Other specified hypothyroidism Follow-up Appointments o Return Appointment in 1 week. [place] for wound care. May utilize formulary equivalent dressing for wound treatment orders unless otherwise specified. Home Health Nurse may visit [facility] to address patientos wound care needs. Alvis Lemmings Bathing/ Shower/ Hygiene o May shower; gently cleanse wound with antibacterial soap, rinse and pat dry prior to dressing wounds Wound Treatment Wound #1 - Gluteus Wound Laterality: Right Cleanser: Normal Saline 3 x Per Week/30 Days Discharge Instructions: Wash your hands with soap and water. Remove old dressing, discard into plastic bag and place into trash. Cleanse the wound with Normal Saline prior to applying a clean dressing using gauze sponges, not tissues or cotton balls. Do not scrub or use excessive force. Pat dry using gauze sponges, not tissue or cotton balls. Primary Dressing: Silvercel 4 1/4x 4 1/4 (in/in) 3 x Per Week/30 Days Discharge Instructions: Apply Silvercel 4 1/4x 4 1/4 (in/in) as instructed Secondary Dressing: Bordered Gauze Sterile-HBD 4x4 (in/in) 3 x Per Week/30 Days Discharge Instructions: Cover wound with Bordered Guaze Sterile as directed Notes ref to pace Electronic Signature(s) Signed: 12/17/2020 5:08:48 PM By: Worthy Keeler PA-C Signed: 12/17/2020 10:01:55 PM By: Carlene Coria RN Entered By: Carlene Coria on 12/17/2020 14:03:56 Todd [name]  ([id number]) -------------------------------------------------------------------------------- Problem List Details Patient Name: Todd [name] Date of Service: 12/17/2020 12:45 PM Medical Record Number: [id number] Patient Account Number: [id number] Date of Birth/Sex: 1955-08-15 (65 y.o. Male) Treating RN: Dolan Amen Primary Care Provider:  [name] Other Clinician: Referring Provider: [name] Treating Provider/Extender: Skipper Cliche in Treatment: 0 Active Problems ICD-10 Encounter Code Description Active Date MDM Diagnosis L89.313 Pressure ulcer of right buttock, stage 3 12/17/2020 No Yes F10.188 Alcohol abuse with other alcohol-induced disorder 12/17/2020 No Yes Z89.611 Acquired absence of right leg above knee 12/17/2020 No Yes Z89.612 Acquired absence of left leg above knee 12/17/2020 No Yes E44.0 Moderate protein-calorie malnutrition 12/17/2020 No Yes E11.622 Type 2 diabetes mellitus with other skin ulcer 12/17/2020 No Yes I10 Essential (primary) hypertension 12/17/2020 No Yes N18.30 Chronic kidney disease, stage 3 unspecified 12/17/2020 No Yes E03.8 Other specified hypothyroidism 12/17/2020 No Yes Inactive Problems Resolved Problems Electronic Signature(s) Signed: 12/17/2020 1:43:43 PM By: Worthy Keeler PA-C Entered By: Worthy Keeler on 12/17/2020 13:43:42 [place], [name] ([id number]) -------------------------------------------------------------------------------- Progress Note Details Patient Name: Todd [name] Date of Service: 12/17/2020 12:45 PM Medical Record Number: [id number] Patient Account Number: [id number] Date of Birth/Sex: 1956/02/01 (64 y.o. Male) Treating RN: Dolan Amen Primary Care Provider: [name] Other Clinician: Referring Provider: [name] Treating Provider/Extender: Skipper Cliche in Treatment: 0 Subjective Chief Complaint Information obtained from Patient Pressure ulcer right buttock History of Present Illness  (HPI) 12/17/2020 upon evaluation today patient presents for initial evaluation here in our clinic concerning issues that he has been having with a wound in the right gluteal region. Subsequently the patient does have significant issues he is a bilateral above-knee amputee. He also has some alcohol abuse disorder there is also mention in the notes of cocaine abuse although the patient actually denies that today. With that being said he does have some protein calorie malnutrition that we discussed later. He also has diabetes, hypertension, chronic kidney disease stage III, and hypothyroidism and it is questionable whether or not he is actually taking his medication for that. With all that being said the patient presents today again with a wound in the right gluteal/ischial location. Subsequently this does appear to be a stage III pressure ulcer but is not too significant at this point which is great news. Nonetheless his history is what is more concerning. With regard to his history of how his day goes he has a aide what sounds like she comes out 3 times a week for roughly 3 hours at most. Does not hours a week total. Other than this he tells me that he is not able to get to the bathroom. He tells [place] that he has to wait for her to come before he is able to get to the toilet. With that being said that means that he is in a brief that is obviously not clean and significantly soiled more often than not. He also tells me that he eats mainly when she is able to fix food for him or is able to leave something for him to get in the microwave. This has me concerned about his nutritional status as well as just his safety but to be perfectly honest. Fortunately there does not appear to be any signs of infection in regard to the wound but again if he is in a soiled brief that could definitely be a significant issue as well. Either way I do not feel like this current situation is ideal nor safe for him to be  honest. Patient History Information obtained from Patient. Allergies lisinopril Social History Current every day smoker, Marital Status - Widowed, Alcohol Use - Daily, Drug Use - No History, Caffeine Use - Moderate. Medical History Cardiovascular Patient has history  of Hypertension Endocrine Patient has history of Type II Diabetes Genitourinary Patient has history of End Stage Renal Disease - stage 3 Patient is treated with Insulin. Review of Systems (ROS) Constitutional Symptoms (General Health) Denies complaints or symptoms of Fatigue, Fever, Chills, Marked Weight Change. Eyes Denies complaints or symptoms of Dry Eyes, Vision Changes, Glasses / Contacts. Ear/Nose/Mouth/Throat Denies complaints or symptoms of Difficult clearing ears, Sinusitis. Hematologic/Lymphatic Denies complaints or symptoms of Bleeding / Clotting Disorders, Human Immunodeficiency Virus. Respiratory Denies complaints or symptoms of Chronic or frequent coughs, Shortness of Breath. Gastrointestinal Denies complaints or symptoms of Frequent diarrhea, Nausea, Vomiting. Endocrine Complains or has symptoms of Thyroid disease. Genitourinary Denies complaints or symptoms of Kidney failure/ Dialysis, Incontinence/dribbling. Immunological Denies complaints or symptoms of Hives, Itching. Integumentary (Skin) Complains or has symptoms of Wounds. Musculoskeletal Denies complaints or symptoms of Muscle Pain, Muscle Weakness. Todd [name], Todd [name] ([id number]) Neurologic Denies complaints or symptoms of Numbness/parasthesias, Focal/Weakness. Psychiatric Denies complaints or symptoms of Anxiety, Claustrophobia. Objective Constitutional sitting or standing blood pressure is within target range for patient.. pulse regular and within target range for patient.[name] respirations regular, non- labored and within target range for patient.[name] temperature within target range for patient.. Thin and well-hydrated in no acute  distress. Vitals Time Taken: 1:15 PM, Height: 68 in, Source: Stated, Weight: 135 lbs, Source: Stated, BMI: 20.5, Temperature: 98.4 F, Pulse: 73 bpm, Respiratory Rate: 16 breaths/min, Blood Pressure: 125/80 mmHg, Capillary Blood Glucose: 121 mg/dl. General Notes: CBG per patient Eyes conjunctiva clear no eyelid edema noted. pupils equal round and reactive to light and accommodation. Ears, Nose, Mouth, and Throat no gross abnormality of ear auricles or external auditory canals. normal hearing noted during conversation. mucus membranes moist. Respiratory normal breathing without difficulty. Psychiatric this patient is able to make decisions and demonstrates good insight into disease process. Alert and Oriented x 3. pleasant and cooperative. General Notes: Upon inspection patient's wound bed actually showed signs of good granulation epithelization in regard to his wound I do not see any evidence of infection right now which is great news as well. With that being said the patient does have bilateral above-knee amputations. Everything is healed and doing fine in that regard but this does limit his ability to be able to get around in general. Obviously this is of concern and I think is the biggest issue as far as his home care situation is concerned. I do believe he needs significantly more help than what he is able to achieve just through the current home health services he has. Integumentary (Hair, Skin) Wound #1 status is Open. Original cause of wound was Pressure Injury. The date acquired was: 10/11/2020. The wound is located on the Right Gluteus. The wound measures 0.8cm length x 0.6cm width x 0.1cm depth; 0.377cm^2 area and 0.038cm^3 volume. There is Fat Layer (Subcutaneous Tissue) exposed. There is no tunneling or undermining noted. There is a none present amount of drainage noted. There is medium (34-66%) red granulation within the wound bed. There is a medium (34-66%) amount of necrotic tissue  within the wound bed including Adherent Slough. Assessment Active Problems ICD-10 Pressure ulcer of right buttock, stage 3 Alcohol abuse with other alcohol-induced disorder Acquired absence of right leg above knee Acquired absence of left leg above knee Moderate protein-calorie malnutrition Type 2 diabetes mellitus with other skin ulcer Essential (primary) hypertension Chronic kidney disease, stage 3 unspecified Other specified hypothyroidism Plan Todd [name], Todd [name] ([id number]) Follow-up Appointments: Return Appointment in 1 week. Home Health: [facility] for  wound care. May utilize formulary equivalent dressing for wound treatment orders unless otherwise specified. Home Health Nurse may visit PRN to address patient s wound care needs. Alvis Lemmings Bathing/ Shower/ Hygiene: May shower; gently cleanse wound with antibacterial soap, rinse and pat dry prior to dressing wounds General Notes: ref to pace WOUND #1: - Gluteus Wound Laterality: Right Cleanser: Normal Saline 3 x Per Week/30 Days Discharge Instructions: Wash your hands with soap and water. Remove old dressing, discard into plastic bag and place into trash. Cleanse the wound with Normal Saline prior to applying a clean dressing using gauze sponges, not tissues or cotton balls. Do not scrub or use excessive force. Pat dry using gauze sponges, not tissue or cotton balls. Primary Dressing: Silvercel 4 1/4x 4 1/4 (in/in) 3 x Per Week/30 Days Discharge Instructions: Apply Silvercel 4 1/4x 4 1/4 (in/in) as instructed Secondary Dressing: Bordered Gauze Sterile-HBD 4x4 (in/in) 3 x Per Week/30 Days Discharge Instructions: Cover wound with Bordered Guaze Sterile as directed 1. Based on what I am seeing currently I think that this would be a patient that would be an excellent candidate for the pace program. I do think that we should try to see about making referral to them to see if they are able to pick him up and in turn help him  with his daily needs both at home and outside. 2. I am also can recommend at this time that we have the patient use a silver cell dressing to the wound followed by border foam dressing to cover. It does sound like that he has home health or least has been ordered although there is still some question here in that regard. Nonetheless I think that the biggest thing right now is going to be for [place] to be sure and do what is best for the patient. To be honest I did get in touch with Adult Protective Services I have not heard back from them yet but I am just curious about the situation where we really should go with this although I am hopeful that the pace program could be significantly beneficial for this patient. Outside of that I feel like his only other option is really a skilled nursing facility. I did discuss this with the patient today about pace versus skilled nursing being his only real options that I for see at this point. 3. I am going to recommend appropriate offloading that means he does not need to be sitting for long periods of time or laying long periods of time on the location of the wound. This is of utmost importance of using to get this to heal. We will see patient back for reevaluation in 1 week here in the clinic. If anything worsens or changes patient will contact our office for additional recommendations. Electronic Signature(s) Signed: 12/17/2020 2:28:40 PM By: Worthy Keeler PA-C Entered By: Worthy Keeler on 12/17/2020 14:28:40 Todd [name], Todd [name] ([id number]) -------------------------------------------------------------------------------- ROS/PFSH Details Patient Name: Todd [name] Date of Service: 12/17/2020 12:45 PM Medical Record Number: [id number] Patient Account Number: [id number] Date of Birth/Sex: 22-Apr-1956 (65 y.o. Male) Treating RN: Carlene Coria Primary Care Provider: [name] Other Clinician: Referring Provider: [name] Treating Provider/Extender:  Skipper Cliche in Treatment: 0 Information Obtained From Patient Constitutional Symptoms (General Health) Complaints and Symptoms: Negative for: Fatigue; Fever; Chills; Marked Weight Change Eyes Complaints and Symptoms: Negative for: Dry Eyes; Vision Changes; Glasses / Contacts Ear/Nose/Mouth/Throat Complaints and Symptoms: Negative for: Difficult clearing ears; Sinusitis Hematologic/Lymphatic Complaints and Symptoms: Negative  for: Bleeding / Clotting Disorders; Human Immunodeficiency Virus Respiratory Complaints and Symptoms: Negative for: Chronic or frequent coughs; Shortness of Breath Gastrointestinal Complaints and Symptoms: Negative for: Frequent diarrhea; Nausea; Vomiting Endocrine Complaints and Symptoms: Positive for: Thyroid disease Medical History: Positive for: Type II Diabetes Time with diabetes: 2 years Treated with: Insulin Genitourinary Complaints and Symptoms: Negative for: Kidney failure/ Dialysis; Incontinence/dribbling Medical History: Positive for: End Stage Renal Disease - stage 3 Immunological Complaints and Symptoms: Negative for: Hives; Itching Integumentary (Skin) Sheffer, Todd [name] ([id number]) Complaints and Symptoms: Positive for: Wounds Musculoskeletal Complaints and Symptoms: Negative for: Muscle Pain; Muscle Weakness Neurologic Complaints and Symptoms: Negative for: Numbness/parasthesias; Focal/Weakness Psychiatric Complaints and Symptoms: Negative for: Anxiety; Claustrophobia Cardiovascular Medical History: Positive for: Hypertension Oncologic Immunizations Pneumococcal Vaccine: Received Pneumococcal Vaccination: No Implantable Devices None Family and Social History Current every day smoker; Marital Status - Widowed; Alcohol Use: Daily; Drug Use: No History; Caffeine Use: Moderate; Financial Concerns: Yes; Food, Clothing or Shelter Needs: Yes; Support System Lacking: Yes; Transportation Concerns: Yes Electronic  Signature(s) Signed: 12/17/2020 5:08:48 PM By: Worthy Keeler PA-C Signed: 12/17/2020 10:01:55 PM By: Carlene Coria RN Entered By: Carlene Coria on 12/17/2020 13:29:13 Todd [name], Todd [name] ([id number]) -------------------------------------------------------------------------------- SuperBill Details Patient Name: Todd [name] Date of Service: 12/17/2020 Medical Record Number: [id number] Patient Account Number: [id number] Date of Birth/Sex: May 22, 1956 (65 y.o. Male) Treating RN: Carlene Coria Primary Care Provider: [name] Other Clinician: Referring Provider: [name] Treating Provider/Extender: Skipper Cliche in Treatment: 0 Diagnosis Coding ICD-10 Codes Code Description L89.313 Pressure ulcer of right buttock, stage 3 F10.188 Alcohol abuse with other alcohol-induced disorder Z89.611 Acquired absence of right leg above knee Z89.612 Acquired absence of left leg above knee E44.0 Moderate protein-calorie malnutrition E11.622 Type 2 diabetes mellitus with other skin ulcer I10 Essential (primary) hypertension N18.30 Chronic kidney disease, stage 3 unspecified E03.8 Other specified hypothyroidism Facility Procedures CPT4 Code: [id number] Description: 99213 - WOUND CARE VISIT-LEV 3 EST PT Modifier: Quantity: 1 Physician Procedures CPT4 Code: 5993570 Description: 17793 - WC PHYS LEVEL 4 - NEW PT Modifier: Quantity: 1 CPT4 Code: Description: ICD-10 Diagnosis Description L89.313 Pressure ulcer of right buttock, stage 3 F10.188 Alcohol abuse with other alcohol-induced disorder Z89.611 Acquired absence of right leg above knee Z89.612 Acquired absence of left leg above knee Modifier: Quantity: Electronic Signature(s) Signed: 12/17/2020 2:28:53 PM By: Worthy Keeler PA-C Entered By: Worthy Keeler on 12/17/2020 14:28:53

## 2020-12-18 NOTE — Progress Notes (Signed)
Todd [name] ([id number]) Visit Report for 12/17/2020 Abuse/Suicide Risk Screen Details Patient Name: Todd [name] Date of Service: 12/17/2020 12:45 PM Medical Record Number: [id number] Patient Account Number: [id number] Date of Birth/Sex: [birth year] (65 y.o. Male) Treating RN: Carlene Coria Primary Care Yianni Skilling: [name] Other Clinician: Referring Kaiulani Sitton: [name] Treating Jaide Hillenburg/Extender: Skipper Cliche in Treatment: 0 Abuse/Suicide Risk Screen Items Answer ABUSE RISK SCREEN: Has anyone close to you tried to hurt or harm you recentlyo No Do you feel uncomfortable with anyone in your familyo No Has anyone forced you do things that you didnot want to doo No Electronic Signature(s) Signed: 12/17/2020 10:01:55 PM By: Carlene Coria RN Entered By: Carlene Coria on 12/17/2020 13:29:27 Todd [name] ([id number]) -------------------------------------------------------------------------------- Activities of Daily Living Details Patient Name: Todd [name] Date of Service: 12/17/2020 12:45 PM Medical Record Number: [id number] Patient Account Number: [id number] Date of Birth/Sex: 1955-11-19 (65 y.o. Male) Treating RN: Carlene Coria Primary Care Reni Hausner: [name] Other Clinician: Referring Kenyon Eshleman: [name] Treating Monee Dembeck/Extender: Skipper Cliche in Treatment: 0 Activities of Daily Living Items Answer Activities of Daily Living (Please select one for each item) Drive Automobile Not Able Take Medications Need Assistance Use Telephone Need Assistance Care for Appearance Need Assistance Use Toilet Need Assistance Bath / Shower Need Assistance Dress Self Need Assistance Feed Self Completely Able Walk Not Able Get In / Out Bed Need Assistance Housework Not Able Prepare Meals Not Able Handle Money Need Assistance Shop for Self Not Able Electronic Signature(s) Signed: 12/17/2020 10:01:55 PM By: Carlene Coria RN Entered By: Carlene Coria on  12/17/2020 13:30:01 Todd [name] ([id number]) -------------------------------------------------------------------------------- Education Screening Details Patient Name: Todd [name] Date of Service: 12/17/2020 12:45 PM Medical Record Number: [id number] Patient Account Number: [id number] Date of Birth/Sex: [birth year] (65 y.o. Male) Treating RN: Carlene Coria Primary Care Tage Feggins: [name] Other Clinician: Referring Alekhya Gravlin: [name] Treating Daliya Parchment/Extender: Skipper Cliche in Treatment: 0 Primary Learner Assessed: Patient Learning Preferences/Education Level/Primary Language Learning Preference: Explanation Highest Education Level: High School Preferred Language: English Cognitive Barrier Language Barrier: No Translator Needed: No Memory Deficit: No Emotional Barrier: No Cultural/Religious Beliefs Affecting Medical Care: No Physical Barrier Impaired Vision: No Impaired Hearing: No Decreased Hand dexterity: No Knowledge/Comprehension Knowledge Level: Low Comprehension Level: Low Ability to understand written instructions: Low Ability to understand verbal instructions: Medium Motivation Anxiety Level: Calm Cooperation: Cooperative Education Importance: Acknowledges Need Interest in Health Problems: Uninterested Perception: Coherent Willingness to Engage in Self-Management Low Activities: Readiness to Engage in [place] Activities: Electronic Signature(s) Signed: 12/17/2020 10:01:55 PM By: Carlene Coria RN Entered By: Carlene Coria on 12/17/2020 13:30:44 Todd [name] ([id number]) -------------------------------------------------------------------------------- Fall Risk Assessment Details Patient Name: Todd [name] Date of Service: 12/17/2020 12:45 PM Medical Record Number: [id number] Patient Account Number: [id number] Date of Birth/Sex: Dec 22, 1955 (65 y.o. Male) Treating RN: Carlene Coria Primary Care Sawsan Riggio: [name] Other  Clinician: Referring Tylynn Braniff: [name] Treating Aminata Buffalo/Extender: Skipper Cliche in Treatment: 0 Fall Risk Assessment Items Have you had 2 or more falls in the last 12 monthso 0 No Have you had any fall that resulted in injury in the last 12 monthso 0 No FALLS RISK SCREEN History of falling - immediate or within 3 months 0 No Secondary diagnosis (Do you have 2 or more medical diagnoseso) 0 No Ambulatory aid None/bed rest/wheelchair/nurse 0 No Crutches/cane/walker 0 No Furniture 0 No Intravenous therapy Access/Saline/Heparin Lock 0 No Gait/Transferring Normal/ bed rest/ wheelchair 0 No Weak (short steps with or without shuffle, stooped but able to lift head while  walking, may 0 No seek support from furniture) Impaired (short steps with shuffle, may have difficulty arising from chair, head down, impaired 0 No balance) Mental Status Oriented to own ability 0 No Electronic Signature(s) Signed: 12/17/2020 10:01:55 PM By: Carlene Coria RN Entered By: Carlene Coria on 12/17/2020 13:30:51 [place], [name] ([id number]) -------------------------------------------------------------------------------- Foot Assessment Details Patient Name: Todd [name] Date of Service: 12/17/2020 12:45 PM Medical Record Number: [id number] Patient Account Number: [id number] Date of Birth/Sex: Mar 02, 1956 (65 y.o. Male) Treating RN: Carlene Coria Primary Care Lincon Sahlin: [name] Other Clinician: Referring Jeramie Scogin: [name] Treating Perris Tripathi/Extender: Skipper Cliche in Treatment: 0 Foot Assessment Items Site Locations + = Sensation present, - = Sensation absent, C = Callus, U = Ulcer R = Redness, W = Warmth, M = Maceration, PU = Pre-ulcerative lesion F = Fissure, S = Swelling, D = Dryness Assessment Right: Left: Other Deformity: No No Prior Foot Ulcer: No No Prior Amputation: Yes Yes Charcot Joint: No No Ambulatory Status: Non-ambulatory Assistance Device: Wheelchair Gait:  Administrator, arts) Signed: 12/17/2020 10:01:55 PM By: Carlene Coria RN Entered By: Carlene Coria on 12/17/2020 13:31:44 Todd [name] ([id number]) -------------------------------------------------------------------------------- Nutrition Risk Screening Details Patient Name: Todd [name] Date of Service: 12/17/2020 12:45 PM Medical Record Number: [id number] Patient Account Number: [id number] Date of Birth/Sex: Nov 22, 1955 (65 y.o. Male) Treating RN: Carlene Coria Primary Care Mishelle Hassan: [name] Other Clinician: Referring Ely Spragg: [name] Treating Marisha Renier/Extender: Skipper Cliche in Treatment: 0 Height (in): 68 Weight (lbs): 135 Body Mass Index (BMI): 20.5 Nutrition Risk Screening Items Score Screening NUTRITION RISK SCREEN: I have an illness or condition that made me change the kind and/or amount of food I eat 2 Yes I eat fewer than two meals per day 3 Yes I eat few fruits and vegetables, or milk products 2 Yes I have three or more drinks of beer, liquor or wine almost every day 2 Yes I have tooth or mouth problems that make it hard for me to eat 0 No I don't always have enough money to buy the food I need 4 Yes I eat alone most of the time 1 Yes I take three or more different prescribed or over-the-counter drugs a day 1 Yes Without wanting to, I have lost or gained 10 pounds in the last six months 2 Yes I am not always physically able to shop, cook and/or feed myself 2 Yes Nutrition Protocols Good Risk Protocol Moderate Risk Protocol High Risk Proctocol 0 Provide education on nutrition Risk Level: High Risk Score: 19 Electronic Signature(s) Signed: 12/17/2020 10:01:55 PM By: Carlene Coria RN Entered By: Carlene Coria on 12/17/2020 13:31:26

## 2020-12-21 DIAGNOSIS — Z89611 Acquired absence of right leg above knee: Secondary | ICD-10-CM | POA: Diagnosis not present

## 2020-12-21 DIAGNOSIS — E876 Hypokalemia: Secondary | ICD-10-CM | POA: Diagnosis not present

## 2020-12-21 DIAGNOSIS — Z89511 Acquired absence of right leg below knee: Secondary | ICD-10-CM | POA: Diagnosis not present

## 2020-12-21 DIAGNOSIS — F1410 Cocaine abuse, uncomplicated: Secondary | ICD-10-CM | POA: Diagnosis not present

## 2020-12-21 DIAGNOSIS — Z89612 Acquired absence of left leg above knee: Secondary | ICD-10-CM | POA: Diagnosis not present

## 2020-12-21 DIAGNOSIS — M109 Gout, unspecified: Secondary | ICD-10-CM | POA: Diagnosis not present

## 2020-12-21 DIAGNOSIS — F1010 Alcohol abuse, uncomplicated: Secondary | ICD-10-CM | POA: Diagnosis not present

## 2020-12-21 DIAGNOSIS — R42 Dizziness and giddiness: Secondary | ICD-10-CM | POA: Diagnosis not present

## 2020-12-21 DIAGNOSIS — Z20822 Contact with and (suspected) exposure to covid-19: Secondary | ICD-10-CM | POA: Diagnosis not present

## 2020-12-21 DIAGNOSIS — N179 Acute kidney failure, unspecified: Secondary | ICD-10-CM | POA: Diagnosis not present

## 2020-12-21 DIAGNOSIS — L089 Local infection of the skin and subcutaneous tissue, unspecified: Secondary | ICD-10-CM | POA: Diagnosis not present

## 2020-12-21 DIAGNOSIS — R5381 Other malaise: Secondary | ICD-10-CM | POA: Diagnosis not present

## 2020-12-21 DIAGNOSIS — G9341 Metabolic encephalopathy: Secondary | ICD-10-CM | POA: Diagnosis not present

## 2020-12-21 DIAGNOSIS — R0602 Shortness of breath: Secondary | ICD-10-CM | POA: Diagnosis not present

## 2020-12-21 DIAGNOSIS — Z993 Dependence on wheelchair: Secondary | ICD-10-CM | POA: Diagnosis not present

## 2020-12-21 DIAGNOSIS — E039 Hypothyroidism, unspecified: Secondary | ICD-10-CM | POA: Diagnosis not present

## 2020-12-21 DIAGNOSIS — Z89512 Acquired absence of left leg below knee: Secondary | ICD-10-CM | POA: Diagnosis not present

## 2020-12-21 DIAGNOSIS — R279 Unspecified lack of coordination: Secondary | ICD-10-CM | POA: Diagnosis not present

## 2020-12-21 DIAGNOSIS — N3000 Acute cystitis without hematuria: Secondary | ICD-10-CM | POA: Diagnosis not present

## 2020-12-21 DIAGNOSIS — R531 Weakness: Secondary | ICD-10-CM | POA: Diagnosis not present

## 2020-12-21 DIAGNOSIS — F1499 Cocaine use, unspecified with unspecified cocaine-induced disorder: Secondary | ICD-10-CM | POA: Diagnosis not present

## 2020-12-21 DIAGNOSIS — R4182 Altered mental status, unspecified: Secondary | ICD-10-CM | POA: Diagnosis not present

## 2020-12-21 DIAGNOSIS — R0902 Hypoxemia: Secondary | ICD-10-CM | POA: Diagnosis not present

## 2020-12-21 DIAGNOSIS — E782 Mixed hyperlipidemia: Secondary | ICD-10-CM | POA: Diagnosis not present

## 2020-12-21 DIAGNOSIS — E049 Nontoxic goiter, unspecified: Secondary | ICD-10-CM | POA: Diagnosis not present

## 2020-12-21 DIAGNOSIS — N182 Chronic kidney disease, stage 2 (mild): Secondary | ICD-10-CM | POA: Diagnosis not present

## 2020-12-21 DIAGNOSIS — I959 Hypotension, unspecified: Secondary | ICD-10-CM | POA: Diagnosis not present

## 2020-12-21 DIAGNOSIS — Z79890 Hormone replacement therapy (postmenopausal): Secondary | ICD-10-CM | POA: Diagnosis not present

## 2020-12-21 DIAGNOSIS — Z7401 Bed confinement status: Secondary | ICD-10-CM | POA: Diagnosis not present

## 2020-12-21 DIAGNOSIS — I10 Essential (primary) hypertension: Secondary | ICD-10-CM | POA: Diagnosis not present

## 2020-12-21 DIAGNOSIS — E860 Dehydration: Secondary | ICD-10-CM | POA: Diagnosis not present

## 2020-12-21 DIAGNOSIS — R410 Disorientation, unspecified: Secondary | ICD-10-CM | POA: Diagnosis not present

## 2020-12-21 DIAGNOSIS — L89152 Pressure ulcer of sacral region, stage 2: Secondary | ICD-10-CM | POA: Diagnosis not present

## 2020-12-21 DIAGNOSIS — E119 Type 2 diabetes mellitus without complications: Secondary | ICD-10-CM | POA: Diagnosis not present

## 2020-12-21 DIAGNOSIS — F17210 Nicotine dependence, cigarettes, uncomplicated: Secondary | ICD-10-CM | POA: Diagnosis not present

## 2020-12-21 DIAGNOSIS — E1122 Type 2 diabetes mellitus with diabetic chronic kidney disease: Secondary | ICD-10-CM | POA: Diagnosis not present

## 2020-12-21 DIAGNOSIS — B9689 Other specified bacterial agents as the cause of diseases classified elsewhere: Secondary | ICD-10-CM | POA: Diagnosis not present

## 2020-12-21 DIAGNOSIS — F1910 Other psychoactive substance abuse, uncomplicated: Secondary | ICD-10-CM | POA: Diagnosis not present

## 2020-12-21 DIAGNOSIS — N390 Urinary tract infection, site not specified: Secondary | ICD-10-CM | POA: Diagnosis not present

## 2020-12-22 ENCOUNTER — Telehealth: Payer: Self-pay | Admitting: *Deleted

## 2020-12-22 DIAGNOSIS — R531 Weakness: Secondary | ICD-10-CM | POA: Diagnosis not present

## 2020-12-22 DIAGNOSIS — N390 Urinary tract infection, site not specified: Secondary | ICD-10-CM | POA: Diagnosis not present

## 2020-12-22 DIAGNOSIS — Z89512 Acquired absence of left leg below knee: Secondary | ICD-10-CM | POA: Diagnosis not present

## 2020-12-22 DIAGNOSIS — Z993 Dependence on wheelchair: Secondary | ICD-10-CM | POA: Diagnosis not present

## 2020-12-22 DIAGNOSIS — F1499 Cocaine use, unspecified with unspecified cocaine-induced disorder: Secondary | ICD-10-CM | POA: Diagnosis not present

## 2020-12-22 DIAGNOSIS — Z89511 Acquired absence of right leg below knee: Secondary | ICD-10-CM | POA: Diagnosis not present

## 2020-12-22 DIAGNOSIS — F1010 Alcohol abuse, uncomplicated: Secondary | ICD-10-CM | POA: Diagnosis not present

## 2020-12-22 NOTE — Telephone Encounter (Signed)
12/22/2020  Chronic Care Management case closed due to patient's dismissal from [facility]. Other CCM Team members notified and patient will be marked as removed from the embedded CCM Program.  Chong Sicilian, BSN, RN-BC [place] [facility] [place] Management Direct Dial: [phone]

## 2020-12-23 DIAGNOSIS — F1410 Cocaine abuse, uncomplicated: Secondary | ICD-10-CM | POA: Diagnosis not present

## 2020-12-23 DIAGNOSIS — N182 Chronic kidney disease, stage 2 (mild): Secondary | ICD-10-CM | POA: Diagnosis not present

## 2020-12-23 DIAGNOSIS — F1010 Alcohol abuse, uncomplicated: Secondary | ICD-10-CM | POA: Diagnosis not present

## 2020-12-23 DIAGNOSIS — L089 Local infection of the skin and subcutaneous tissue, unspecified: Secondary | ICD-10-CM | POA: Diagnosis not present

## 2020-12-23 DIAGNOSIS — Z89612 Acquired absence of left leg above knee: Secondary | ICD-10-CM | POA: Diagnosis not present

## 2020-12-23 DIAGNOSIS — R531 Weakness: Secondary | ICD-10-CM | POA: Diagnosis not present

## 2020-12-23 DIAGNOSIS — B9689 Other specified bacterial agents as the cause of diseases classified elsewhere: Secondary | ICD-10-CM | POA: Diagnosis not present

## 2020-12-23 DIAGNOSIS — N390 Urinary tract infection, site not specified: Secondary | ICD-10-CM | POA: Diagnosis not present

## 2020-12-23 DIAGNOSIS — E039 Hypothyroidism, unspecified: Secondary | ICD-10-CM | POA: Diagnosis not present

## 2020-12-24 DIAGNOSIS — N182 Chronic kidney disease, stage 2 (mild): Secondary | ICD-10-CM | POA: Diagnosis not present

## 2020-12-24 DIAGNOSIS — L089 Local infection of the skin and subcutaneous tissue, unspecified: Secondary | ICD-10-CM | POA: Diagnosis not present

## 2020-12-24 DIAGNOSIS — N390 Urinary tract infection, site not specified: Secondary | ICD-10-CM | POA: Diagnosis not present

## 2020-12-24 DIAGNOSIS — F1410 Cocaine abuse, uncomplicated: Secondary | ICD-10-CM | POA: Diagnosis not present

## 2020-12-24 DIAGNOSIS — B9689 Other specified bacterial agents as the cause of diseases classified elsewhere: Secondary | ICD-10-CM | POA: Diagnosis not present

## 2020-12-24 DIAGNOSIS — F1910 Other psychoactive substance abuse, uncomplicated: Secondary | ICD-10-CM | POA: Diagnosis not present

## 2020-12-24 DIAGNOSIS — E039 Hypothyroidism, unspecified: Secondary | ICD-10-CM | POA: Diagnosis not present

## 2020-12-24 DIAGNOSIS — R531 Weakness: Secondary | ICD-10-CM | POA: Diagnosis not present

## 2020-12-24 DIAGNOSIS — Z79890 Hormone replacement therapy (postmenopausal): Secondary | ICD-10-CM | POA: Diagnosis not present

## 2020-12-25 DIAGNOSIS — N390 Urinary tract infection, site not specified: Secondary | ICD-10-CM | POA: Diagnosis not present

## 2020-12-25 DIAGNOSIS — Z7401 Bed confinement status: Secondary | ICD-10-CM | POA: Diagnosis not present

## 2020-12-25 DIAGNOSIS — B9689 Other specified bacterial agents as the cause of diseases classified elsewhere: Secondary | ICD-10-CM | POA: Diagnosis not present

## 2020-12-25 DIAGNOSIS — R279 Unspecified lack of coordination: Secondary | ICD-10-CM | POA: Diagnosis not present

## 2020-12-25 DIAGNOSIS — F1499 Cocaine use, unspecified with unspecified cocaine-induced disorder: Secondary | ICD-10-CM | POA: Diagnosis not present

## 2020-12-25 DIAGNOSIS — N182 Chronic kidney disease, stage 2 (mild): Secondary | ICD-10-CM | POA: Diagnosis not present

## 2020-12-25 DIAGNOSIS — R531 Weakness: Secondary | ICD-10-CM | POA: Diagnosis not present

## 2020-12-25 DIAGNOSIS — F1010 Alcohol abuse, uncomplicated: Secondary | ICD-10-CM | POA: Diagnosis not present

## 2020-12-25 DIAGNOSIS — R5381 Other malaise: Secondary | ICD-10-CM | POA: Diagnosis not present

## 2020-12-26 ENCOUNTER — Emergency Department (HOSPITAL_COMMUNITY): Payer: Medicare HMO

## 2020-12-26 ENCOUNTER — Encounter (HOSPITAL_COMMUNITY): Payer: Self-pay | Admitting: Emergency Medicine

## 2020-12-26 ENCOUNTER — Inpatient Hospital Stay (HOSPITAL_COMMUNITY)
Admission: EM | Admit: 2020-12-26 | Discharge: 2020-12-29 | DRG: 641 | Disposition: A | Discharge status: Home / Self Care | Payer: Medicare HMO | Attending: Family Medicine | Admitting: Family Medicine

## 2020-12-26 ENCOUNTER — Other Ambulatory Visit: Payer: Self-pay

## 2020-12-26 DIAGNOSIS — N179 Acute kidney failure, unspecified: Secondary | ICD-10-CM | POA: Diagnosis present

## 2020-12-26 DIAGNOSIS — E876 Hypokalemia: Secondary | ICD-10-CM | POA: Diagnosis present

## 2020-12-26 DIAGNOSIS — N183 Chronic kidney disease, stage 3 unspecified: Secondary | ICD-10-CM | POA: Diagnosis present

## 2020-12-26 DIAGNOSIS — F1410 Cocaine abuse, uncomplicated: Secondary | ICD-10-CM | POA: Diagnosis present

## 2020-12-26 DIAGNOSIS — I129 Hypertensive chronic kidney disease with stage 1 through stage 4 chronic kidney disease, or unspecified chronic kidney disease: Secondary | ICD-10-CM | POA: Diagnosis present

## 2020-12-26 DIAGNOSIS — Z89612 Acquired absence of left leg above knee: Secondary | ICD-10-CM | POA: Diagnosis not present

## 2020-12-26 DIAGNOSIS — Z89611 Acquired absence of right leg above knee: Secondary | ICD-10-CM

## 2020-12-26 DIAGNOSIS — Z9114 Patient's other noncompliance with medication regimen: Secondary | ICD-10-CM | POA: Diagnosis not present

## 2020-12-26 DIAGNOSIS — E878 Other disorders of electrolyte and fluid balance, not elsewhere classified: Secondary | ICD-10-CM | POA: Diagnosis present

## 2020-12-26 DIAGNOSIS — E1122 Type 2 diabetes mellitus with diabetic chronic kidney disease: Secondary | ICD-10-CM | POA: Diagnosis not present

## 2020-12-26 DIAGNOSIS — I4891 Unspecified atrial fibrillation: Secondary | ICD-10-CM | POA: Diagnosis not present

## 2020-12-26 DIAGNOSIS — E1165 Type 2 diabetes mellitus with hyperglycemia: Secondary | ICD-10-CM | POA: Diagnosis present

## 2020-12-26 DIAGNOSIS — E871 Hypo-osmolality and hyponatremia: Secondary | ICD-10-CM | POA: Diagnosis not present

## 2020-12-26 DIAGNOSIS — F17210 Nicotine dependence, cigarettes, uncomplicated: Secondary | ICD-10-CM | POA: Diagnosis present

## 2020-12-26 DIAGNOSIS — I472 Ventricular tachycardia, unspecified: Secondary | ICD-10-CM

## 2020-12-26 DIAGNOSIS — Z7982 Long term (current) use of aspirin: Secondary | ICD-10-CM

## 2020-12-26 DIAGNOSIS — Z8249 Family history of ischemic heart disease and other diseases of the circulatory system: Secondary | ICD-10-CM

## 2020-12-26 DIAGNOSIS — E11649 Type 2 diabetes mellitus with hypoglycemia without coma: Secondary | ICD-10-CM

## 2020-12-26 DIAGNOSIS — Z20822 Contact with and (suspected) exposure to covid-19: Secondary | ICD-10-CM | POA: Diagnosis present

## 2020-12-26 DIAGNOSIS — Z9119 Patient's noncompliance with other medical treatment and regimen: Secondary | ICD-10-CM | POA: Diagnosis not present

## 2020-12-26 DIAGNOSIS — I959 Hypotension, unspecified: Secondary | ICD-10-CM | POA: Diagnosis not present

## 2020-12-26 DIAGNOSIS — R627 Adult failure to thrive: Secondary | ICD-10-CM | POA: Diagnosis present

## 2020-12-26 DIAGNOSIS — E785 Hyperlipidemia, unspecified: Secondary | ICD-10-CM | POA: Diagnosis present

## 2020-12-26 DIAGNOSIS — E039 Hypothyroidism, unspecified: Secondary | ICD-10-CM | POA: Diagnosis present

## 2020-12-26 DIAGNOSIS — Z720 Tobacco use: Secondary | ICD-10-CM | POA: Diagnosis present

## 2020-12-26 DIAGNOSIS — R55 Syncope and collapse: Secondary | ICD-10-CM | POA: Diagnosis present

## 2020-12-26 DIAGNOSIS — E118 Type 2 diabetes mellitus with unspecified complications: Secondary | ICD-10-CM | POA: Diagnosis present

## 2020-12-26 DIAGNOSIS — I10 Essential (primary) hypertension: Secondary | ICD-10-CM

## 2020-12-26 DIAGNOSIS — M109 Gout, unspecified: Secondary | ICD-10-CM | POA: Diagnosis present

## 2020-12-26 DIAGNOSIS — R630 Anorexia: Secondary | ICD-10-CM | POA: Diagnosis present

## 2020-12-26 DIAGNOSIS — F1010 Alcohol abuse, uncomplicated: Secondary | ICD-10-CM | POA: Diagnosis present

## 2020-12-26 DIAGNOSIS — I480 Paroxysmal atrial fibrillation: Secondary | ICD-10-CM | POA: Diagnosis present

## 2020-12-26 DIAGNOSIS — Z79890 Hormone replacement therapy (postmenopausal): Secondary | ICD-10-CM

## 2020-12-26 DIAGNOSIS — E860 Dehydration: Secondary | ICD-10-CM | POA: Diagnosis present

## 2020-12-26 DIAGNOSIS — Z833 Family history of diabetes mellitus: Secondary | ICD-10-CM

## 2020-12-26 DIAGNOSIS — I482 Chronic atrial fibrillation, unspecified: Secondary | ICD-10-CM | POA: Diagnosis present

## 2020-12-26 DIAGNOSIS — Z794 Long term (current) use of insulin: Secondary | ICD-10-CM | POA: Diagnosis present

## 2020-12-26 DIAGNOSIS — Z79899 Other long term (current) drug therapy: Secondary | ICD-10-CM

## 2020-12-26 DIAGNOSIS — IMO0002 Reserved for concepts with insufficient information to code with codable children: Secondary | ICD-10-CM | POA: Diagnosis present

## 2020-12-26 DIAGNOSIS — R531 Weakness: Secondary | ICD-10-CM | POA: Diagnosis not present

## 2020-12-26 DIAGNOSIS — Z681 Body mass index (BMI) 19 or less, adult: Secondary | ICD-10-CM | POA: Diagnosis not present

## 2020-12-26 DIAGNOSIS — R0902 Hypoxemia: Secondary | ICD-10-CM | POA: Diagnosis not present

## 2020-12-26 DIAGNOSIS — R279 Unspecified lack of coordination: Secondary | ICD-10-CM | POA: Diagnosis not present

## 2020-12-26 DIAGNOSIS — Z7901 Long term (current) use of anticoagulants: Secondary | ICD-10-CM

## 2020-12-26 DIAGNOSIS — Z7401 Bed confinement status: Secondary | ICD-10-CM | POA: Diagnosis not present

## 2020-12-26 DIAGNOSIS — Z888 Allergy status to other drugs, medicaments and biological substances status: Secondary | ICD-10-CM

## 2020-12-26 LAB — CBC WITH DIFFERENTIAL/PLATELET
Abs Immature Granulocytes: 0.04 10*3/uL (ref 0.00–0.07)
Basophils Absolute: 0 10*3/uL (ref 0.0–0.1)
Basophils Relative: 0 %
Eosinophils Absolute: 0 10*3/uL (ref 0.0–0.5)
Eosinophils Relative: 0 %
HCT: 30.8 % — ABNORMAL LOW (ref 39.0–52.0)
Hemoglobin: 9.9 g/dL — ABNORMAL LOW (ref 13.0–17.0)
Immature Granulocytes: 1 %
Lymphocytes Relative: 30 %
Lymphs Abs: 2.1 10*3/uL (ref 0.7–4.0)
MCH: 27.2 pg (ref 26.0–34.0)
MCHC: 32.1 g/dL (ref 30.0–36.0)
MCV: 84.6 fL (ref 80.0–100.0)
Monocytes Absolute: 0.5 10*3/uL (ref 0.1–1.0)
Monocytes Relative: 7 %
Neutro Abs: 4.3 10*3/uL (ref 1.7–7.7)
Neutrophils Relative %: 62 %
Platelets: 417 10*3/uL — ABNORMAL HIGH (ref 150–400)
RBC: 3.64 MIL/uL — ABNORMAL LOW (ref 4.22–5.81)
RDW: 16.8 % — ABNORMAL HIGH (ref 11.5–15.5)
WBC: 7 10*3/uL (ref 4.0–10.5)
nRBC: 0 % (ref 0.0–0.2)

## 2020-12-26 LAB — RAPID URINE DRUG SCREEN, HOSP PERFORMED
Amphetamines: NOT DETECTED
Barbiturates: NOT DETECTED
Benzodiazepines: NOT DETECTED
Cocaine: POSITIVE — AB
Opiates: POSITIVE — AB
Tetrahydrocannabinol: NOT DETECTED

## 2020-12-26 LAB — COMPREHENSIVE METABOLIC PANEL
ALT: 8 U/L (ref 0–44)
AST: 19 U/L (ref 15–41)
Albumin: 1.8 g/dL — ABNORMAL LOW (ref 3.5–5.0)
Alkaline Phosphatase: 75 U/L (ref 38–126)
Anion gap: 6 (ref 5–15)
BUN: 10 mg/dL (ref 8–23)
CO2: 20 mmol/L — ABNORMAL LOW (ref 22–32)
Calcium: 6 mg/dL — CL (ref 8.9–10.3)
Chloride: 105 mmol/L (ref 98–111)
Creatinine, Ser: 1.27 mg/dL — ABNORMAL HIGH (ref 0.61–1.24)
GFR, Estimated: >60 mL/min (ref >60)
Glucose, Bld: 85 mg/dL (ref 70–99)
Potassium: 3.5 mmol/L (ref 3.5–5.1)
Sodium: 131 mmol/L — ABNORMAL LOW (ref 135–145)
Total Bilirubin: 0.7 mg/dL (ref 0.3–1.2)
Total Protein: 6.5 g/dL (ref 6.5–8.1)

## 2020-12-26 LAB — URINALYSIS, ROUTINE W REFLEX MICROSCOPIC
Bilirubin Urine: NEGATIVE
Glucose, UA: NEGATIVE mg/dL
Hgb urine dipstick: NEGATIVE
Ketones, ur: NEGATIVE mg/dL
Leukocytes,Ua: NEGATIVE
Nitrite: NEGATIVE
Protein, ur: NEGATIVE mg/dL
Specific Gravity, Urine: 1.014 (ref 1.005–1.030)
pH: 7 (ref 5.0–8.0)

## 2020-12-26 LAB — TROPONIN I (HIGH SENSITIVITY)
Troponin I (High Sensitivity): 14 ng/L (ref <18)
Troponin I (High Sensitivity): 13 ng/L (ref <18)

## 2020-12-26 LAB — GLUCOSE, CAPILLARY: Glucose-Capillary: 102 mg/dL — ABNORMAL HIGH (ref 70–99)

## 2020-12-26 LAB — ETHANOL: Alcohol, Ethyl (B): <10 mg/dL (ref <10)

## 2020-12-26 LAB — TSH: TSH: 189.828 u[IU]/mL — ABNORMAL HIGH (ref 0.350–4.500)

## 2020-12-26 MED ORDER — ACETAMINOPHEN 325 MG PO TABS: 650.0000 mg | ORAL_TABLET | ORAL | Status: DC | PRN

## 2020-12-26 MED ORDER — INSULIN ASPART 100 UNIT/ML IJ SOLN: 0.0000 [IU] | Freq: Every day | INTRAMUSCULAR | Status: DC

## 2020-12-26 MED ORDER — FOLIC ACID 1 MG PO TABS
1.0000 mg | ORAL_TABLET | Freq: Every day | ORAL | Status: DC
  Administered 2020-12-27 – 2020-12-29 (×3): 1 mg via ORAL
  Filled 2020-12-26 (×3): qty 1

## 2020-12-26 MED ORDER — ONDANSETRON HCL 4 MG/2ML IJ SOLN: 4.0000 mg | Freq: Four times a day (QID) | INTRAMUSCULAR | Status: DC | PRN

## 2020-12-26 MED ORDER — ASPIRIN EC 81 MG PO TBEC
81.0000 mg | DELAYED_RELEASE_TABLET | Freq: Every day | ORAL | Status: DC
  Administered 2020-12-27 – 2020-12-29 (×3): 81 mg via ORAL
  Filled 2020-12-26 (×3): qty 1

## 2020-12-26 MED ORDER — SERTRALINE HCL 50 MG PO TABS
50.0000 mg | ORAL_TABLET | Freq: Every day | ORAL | Status: DC
  Administered 2020-12-26 – 2020-12-28 (×3): 50 mg via ORAL
  Filled 2020-12-26 (×3): qty 1

## 2020-12-26 MED ORDER — INSULIN ASPART 100 UNIT/ML IJ SOLN
0.0000 [IU] | Freq: Three times a day (TID) | INTRAMUSCULAR | Status: DC
  Administered 2020-12-29: 2 [IU] via SUBCUTANEOUS

## 2020-12-26 MED ORDER — SODIUM CHLORIDE 0.9 % IV BOLUS
1000.0000 mL | Freq: Once | INTRAVENOUS | Status: AC
  Administered 2020-12-26: 1000 mL via INTRAVENOUS

## 2020-12-26 MED ORDER — MEGESTROL ACETATE 400 MG/10ML PO SUSP
400.0000 mg | Freq: Two times a day (BID) | ORAL | Status: DC
  Administered 2020-12-26 – 2020-12-29 (×6): 400 mg via ORAL
  Filled 2020-12-26 (×6): qty 10

## 2020-12-26 MED ORDER — APIXABAN 5 MG PO TABS
5.0000 mg | ORAL_TABLET | Freq: Two times a day (BID) | ORAL | Status: DC
  Administered 2020-12-26 – 2020-12-29 (×6): 5 mg via ORAL
  Filled 2020-12-26 (×6): qty 1

## 2020-12-26 MED ORDER — ENSURE ENLIVE PO LIQD
237.0000 mL | Freq: Two times a day (BID) | ORAL | Status: DC
  Administered 2020-12-27 – 2020-12-29 (×5): 237 mL via ORAL

## 2020-12-26 MED ORDER — METOPROLOL TARTRATE 25 MG PO TABS
25.0000 mg | ORAL_TABLET | Freq: Two times a day (BID) | ORAL | Status: DC
  Administered 2020-12-26: 25 mg via ORAL
  Filled 2020-12-26: qty 1

## 2020-12-26 MED ORDER — LEVOTHYROXINE SODIUM 75 MCG PO TABS
75.0000 ug | ORAL_TABLET | Freq: Every day | ORAL | Status: DC
  Administered 2020-12-27: 75 ug via ORAL
  Filled 2020-12-26: qty 1

## 2020-12-26 MED ORDER — ATORVASTATIN CALCIUM 40 MG PO TABS
40.0000 mg | ORAL_TABLET | Freq: Every evening | ORAL | Status: DC
  Administered 2020-12-26 – 2020-12-29 (×4): 40 mg via ORAL
  Filled 2020-12-26 (×4): qty 1

## 2020-12-26 MED ORDER — CALCIUM GLUCONATE-NACL 1-0.675 GM/50ML-% IV SOLN
1.0000 g | Freq: Once | INTRAVENOUS | Status: AC
  Administered 2020-12-26: 1000 mg via INTRAVENOUS
  Filled 2020-12-26: qty 50

## 2020-12-26 MED ORDER — LORATADINE 10 MG PO TABS
10.0000 mg | ORAL_TABLET | Freq: Every day | ORAL | Status: DC
  Administered 2020-12-27 – 2020-12-29 (×3): 10 mg via ORAL
  Filled 2020-12-26 (×3): qty 1

## 2020-12-26 NOTE — ED Notes (Signed)
Microwaved [place] meal, crackers, and drink offered to patient. Patient declined meal.

## 2020-12-26 NOTE — ED Notes (Addendum)
Call placed to Department 300 to give report. Patient is assigned to room 335. Nurse is unavailable and will call this RN back.

## 2020-12-26 NOTE — ED Provider Notes (Signed)
[facility] EMERGENCY DEPARTMENT Provider Note   CSN: [id number] Arrival date & time: 12/26/20  1441     History Chief Complaint  Patient presents with   Near Syncope    Todd [name] is a 65 y.o. male.  HPI 65 year old male presents with syncope.  History is taken from the patient and the nurse.  Patient was in his wheelchair and a apparently passed out with family around him.  He denies any preceding symptoms.  He just got out of the hospital yesterday.  He reports he has not used cocaine within the last couple months and had no alcohol today.  He denies a headache, chest pain, shortness of breath, or any other new complaints.  He states he does not quite feel himself but he otherwise denies anything focal such as focal weakness.  Past Medical History:  Diagnosis Date   AKI (acute kidney injury) ([place])    Constipated    Diabetes mellitus without complication ([place])    Diarrhea    Elevated LFTs    Goiter    Gout    Hyperlipidemia    Hypertension    Leukocytosis    Reactive thrombocytosis    Right BKA infection ([place]) 08/2016   Right leg pain    Sepsis due to undetermined organism [facility])    Thyroid disease    Wound infection after surgery 08/2016    Patient Active Problem List   Diagnosis Date Noted   Atrial fibrillation ([place]) 12/26/2020   Hypocalcemia 98/33/8250   Acute metabolic encephalopathy 53/97/6734   Hypoglycemia 12/05/2020   AKI (acute kidney injury) ([place]) 12/04/2020   Vitamin D deficiency 06/17/2019   Essential hypertension, benign 08/30/2018   Mixed hyperlipidemia 07/17/2018   CKD stage 3 due to type 2 diabetes mellitus ([place]) 02/14/2018   Hyponatremia 02/14/2018   Medically noncompliant 02/14/2018   S/P AKA (above knee amputation) bilateral ([place]) 02/12/2018   Current smoker 02/03/2018   Leukocytosis 02/03/2018   Reactive thrombocytosis 02/03/2018   Urinary incontinence 08/14/2017   Transaminitis    Type 2 diabetes mellitus with stage 4 chronic kidney  disease, with long-term current use of insulin ([place]) 07/21/2016   Hyperuricemia 06/21/2016   HTN (hypertension) 01/05/2016   Diabetes type 2, uncontrolled ([place]) 01/05/2016   Hypothyroidism 05/10/2011    Past Surgical History:  Procedure Laterality Date   ABDOMINAL AORTOGRAM N/A 08/11/2016   Procedure: Abdominal Aortogram;  Surgeon: Waynetta Sandy, MD;  Location: [place] [facility];  Service: Cardiovascular;  Laterality: N/A;   ABDOMINAL AORTOGRAM W/LOWER EXTREMITY N/A 08/15/2016   Procedure: Abdominal Aortogram w/Lower Extremity;  Surgeon: Elam Dutch, MD;  Location: [place] [facility];  Service: Cardiovascular;  Laterality: N/A;   AMPUTATION Right 08/17/2016   Procedure: RIGHT BELOW KNEE AMPUTATION;  Surgeon: Elam Dutch, MD;  Location: [place];  Service: Vascular;  Laterality: Right;   AMPUTATION Right 09/12/2016   Procedure: AMPUTATION ABOVE KNEE;  Surgeon: Newt Minion, MD;  Location: [place];  Service: Orthopedics;  Laterality: Right;   AMPUTATION Left 08/12/2016   Procedure: LEFT BELOW KNEE AMPUTATION;  Surgeon: Newt Minion, MD;  Location: [place];  Service: Orthopedics;  Laterality: Left;   AMPUTATION Left 11/01/2017   Procedure: LEFT ABOVE KNEE AMPUTATION;  Surgeon: Newt Minion, MD;  Location: [place];  Service: Orthopedics;  Laterality: Left;   APPLICATION OF WOUND VAC Right 09/12/2016   Procedure: APPLICATION OF WOUND VAC ABOVE KNEE;  Surgeon: Newt Minion, MD;  Location: [place];  Service: Orthopedics;  Laterality: Right;   LOWER EXTREMITY ANGIOGRAPHY Bilateral 08/11/2016   Procedure: Lower Extremity Angiography;  Surgeon: Waynetta Sandy, MD;  Location: [place] [facility];  Service: Cardiovascular;  Laterality: Bilateral;   PERIPHERAL VASCULAR BALLOON ANGIOPLASTY Left 08/11/2016   Procedure: Peripheral Vascular Balloon Angioplasty;  Surgeon: Waynetta Sandy, MD;  Location: [place] [facility];  Service: Cardiovascular;  Laterality: Left;  SFA   THYROID SURGERY          Family History  Problem Relation Age of Onset   Heart disease Mother    Pneumonia Father    Diabetes Maternal Aunt    Diabetes Maternal Uncle     Social History   Tobacco Use   Smoking status: Every Day    Packs/day: 0.25    Years: 45.00    Pack years: 11.25    Types: Cigarettes    Last attempt to quit: 11/12/2014    Years since quitting: 6.1   Smokeless tobacco: Never  Vaping Use   Vaping Use: Never used  Substance Use Topics   Alcohol use: Yes    Alcohol/week: 2.0 standard drinks    Types: 1 Glasses of wine, 1 Cans of beer per week   Drug use: No    Home Medications Prior to Admission medications   Medication Sig Start Date End Date Taking? Authorizing Provider  allopurinol (ZYLOPRIM) 100 MG tablet Take 1 tablet (100 mg total) by mouth daily. 12/06/20  Yes Johnson, Clanford L, MD  aspirin EC 81 MG tablet Take 81 mg by mouth daily.   Yes [provider]  atorvastatin (LIPITOR) 40 MG tablet TAKE 1 TABLET BY MOUTH ONCE DAILY AT 6 PM 12/06/20  Yes Johnson, Clanford L, MD  blood glucose meter kit and supplies Dispense based on patient and insurance preference. Use up to four times daily as directed. (FOR ICD-10 E10.9, E11.9). 12/06/20  Yes Johnson, Clanford L, MD  cetirizine (ZYRTEC) 10 MG tablet Take 1 tablet (10 mg total) by mouth daily as needed for allergies. For allergy symptoms 12/06/20  Yes Johnson, Clanford L, MD  feeding supplement (ENSURE ENLIVE / ENSURE PLUS) LIQD Take 237 mLs by mouth 2 (two) times daily between meals. 12/06/20 01/05/21 Yes Johnson, Clanford L, MD  folic acid (FOLVITE) 1 MG tablet Take 1 tablet (1 mg total) by mouth daily. 12/06/20  Yes Johnson, Clanford L, MD  glucose blood (ACCU-CHEK AVIVA PLUS) test strip Check blood sugars four times a day 03/12/18  Yes [name], [name], MD  levothyroxine (SYNTHROID) 75 MCG tablet Take 1 tablet (75 mcg total) by mouth daily before breakfast. 12/06/20  Yes Johnson, Clanford L, MD  megestrol (MEGACE) 400  MG/10ML suspension Take 10 mLs (400 mg total) by mouth 2 (two) times daily. For appetite stimulation 12/06/20  Yes Johnson, Clanford L, MD  metoprolol succinate (TOPROL-XL) 100 MG 24 hr tablet Take 100 mg by mouth daily. 10/16/20  Yes [provider]  sertraline (ZOLOFT) 50 MG tablet Take 1 tablet (50 mg total) by mouth at bedtime. 12/06/20  Yes Johnson, Clanford L, MD  sodium bicarbonate 650 MG tablet Take 1 tablet (650 mg total) by mouth 3 (three) times daily. 12/06/20  Yes Johnson, Clanford L, MD  thiamine 100 MG tablet Take 100 mg by mouth daily. 12/25/20  Yes [provider]    Allergies    Lisinopril  Review of Systems   Review of Systems  Constitutional:  Negative for fever.  Respiratory:  Negative for shortness of  breath.   Cardiovascular:  Negative for chest pain.  Gastrointestinal:  Negative for abdominal pain, diarrhea and vomiting.  Genitourinary:  Negative for dysuria.  Neurological:  Positive for syncope.  All other systems reviewed and are negative.  Physical Exam Updated Vital Signs BP 123/77 (BP Location: Left Arm)   Pulse 93   Temp 98.3 F (36.8 C) (Oral)   Resp 18   Ht 5' (1.524 m) Comment: Bilateral BKA  Wt 58.2 kg   SpO2 100%   BMI 25.06 kg/m   Physical Exam Vitals and nursing note reviewed.  Constitutional:      Appearance: He is well-developed.  HENT:     Head: Normocephalic and atraumatic.     Right Ear: External ear normal.     Left Ear: External ear normal.     Nose: Nose normal.  Eyes:     General:        Right eye: No discharge.        Left eye: No discharge.  Cardiovascular:     Rate and Rhythm: Normal rate and regular rhythm.     Heart sounds: Normal heart sounds.  Pulmonary:     Effort: Pulmonary effort is normal.     Breath sounds: Normal breath sounds.  Abdominal:     Palpations: Abdomen is soft.     Tenderness: There is no abdominal tenderness.  Musculoskeletal:     Cervical back: Neck supple.  Skin:    General:  Skin is warm and dry.  Neurological:     Mental Status: He is alert and oriented to person, place, and time.     Comments: CN 3-12 grossly intact. 5/5 strength in all 4 extremities. Grossly normal sensation. Normal finger to nose.   Psychiatric:        Mood and Affect: Mood is not anxious.    ED Results / Procedures / Treatments   Labs (all labs ordered are listed, but only abnormal results are displayed) Labs Reviewed  COMPREHENSIVE METABOLIC PANEL - Abnormal; Notable for the following components:      Result Value   Sodium 131 (*)    CO2 20 (*)    Creatinine, Ser 1.27 (*)    Calcium 6.0 (*)    Albumin 1.8 (*)    All other components within normal limits  CBC WITH DIFFERENTIAL/PLATELET - Abnormal; Notable for the following components:   RBC 3.64 (*)    Hemoglobin 9.9 (*)    HCT 30.8 (*)    RDW 16.8 (*)    Platelets 417 (*)    All other components within normal limits  RAPID URINE DRUG SCREEN, HOSP PERFORMED - Abnormal; Notable for the following components:   Opiates POSITIVE (*)    Cocaine POSITIVE (*)    All other components within normal limits  TSH - Abnormal; Notable for the following components:   TSH 189.828 (*)    All other components within normal limits  GLUCOSE, CAPILLARY - Abnormal; Notable for the following components:   Glucose-Capillary 102 (*)    All other components within normal limits  SARS CORONAVIRUS 2 (TAT 6-24 HRS)  URINALYSIS, ROUTINE W REFLEX MICROSCOPIC  ETHANOL  T3, FREE  T4, FREE  COMPREHENSIVE METABOLIC PANEL  TROPONIN I (HIGH SENSITIVITY)  TROPONIN I (HIGH SENSITIVITY)    EKG EKG Interpretation  Date/Time:  Saturday December 26 2020 14:57:39 EDT Ventricular Rate:  114 PR Interval:    QRS Duration: 96 QT Interval:  332 QTC Calculation: 458 R Axis:   -  1 Text Interpretation: Atrial fibrillation Borderline low voltage, extremity leads Borderline repolarization abnormality Confirmed by Sherwood Gambler [phone]) on 12/26/2020 3:41:10  PM  Radiology DG Chest Portable 1 View  Result Date: 12/26/2020 CLINICAL DATA:  Weakness and syncopal episode yesterday. EXAM: PORTABLE CHEST 1 VIEW COMPARISON:  November 21, 2020 FINDINGS: Tortuosity and calcific atherosclerotic disease of the aorta. Postsurgical changes at the thoracic inlet. Cardiomediastinal silhouette is normal. Mediastinal contours appear intact. There is no evidence of pleural effusion or pneumothorax. Streaky airspace opacities in the right lung base. Osseous structures are without acute abnormality. Soft tissues are grossly normal. IMPRESSION: Streaky airspace opacities in the right lung base, which may represent atelectasis or airspace consolidation. Electronically Signed   By: Fidela Salisbury M.D.   On: 12/26/2020 16:13    Procedures Procedures   Medications Ordered in ED Medications  aspirin EC tablet 81 mg (has no administration in time range)  megestrol (MEGACE) 400 MG/10ML suspension 400 mg (400 mg Oral Given 12/26/20 2321)  atorvastatin (LIPITOR) tablet 40 mg (40 mg Oral Given 12/26/20 2321)  sertraline (ZOLOFT) tablet 50 mg (50 mg Oral Given 12/26/20 2321)  levothyroxine (SYNTHROID) tablet 75 mcg (has no administration in time range)  folic acid (FOLVITE) tablet 1 mg (has no administration in time range)  feeding supplement (ENSURE ENLIVE / ENSURE PLUS) liquid 237 mL (has no administration in time range)  loratadine (CLARITIN) tablet 10 mg (has no administration in time range)  acetaminophen (TYLENOL) tablet 650 mg (has no administration in time range)  ondansetron (ZOFRAN) injection 4 mg (has no administration in time range)  apixaban (ELIQUIS) tablet 5 mg (5 mg Oral Given 12/26/20 2321)  metoprolol tartrate (LOPRESSOR) tablet 25 mg (25 mg Oral Given 12/26/20 2321)  insulin aspart (novoLOG) injection 0-15 Units (has no administration in time range)  insulin aspart (novoLOG) injection 0-5 Units (0 Units Subcutaneous Not Given 12/26/20 2202)  sodium chloride 0.9 %  bolus 1,000 mL (0 mLs Intravenous Stopped 12/26/20 1747)  calcium gluconate 1 g/ 50 mL sodium chloride IVPB (0 g Intravenous Stopped 12/26/20 1931)    ED Course  I have reviewed the triage vital signs and the nursing notes.  Pertinent labs & imaging results that were available during my care of the patient were reviewed by me and considered in my medical decision making (see chart for details).    MDM Rules/Calculators/A&P                          Patient is not ill-appearing.  He has either multifocal atrial tachycardia or A. fib, its count of hard to tell in the ECGs.  He does have hypocalcemia, while its not as low as the actual number given his significantly low albumin his corrected calcium is actually 7.8.  This is still low and so he will be given IV calcium as this could be driving new onset arrhythmia.  Given the arrhythmia in the setting of syncope I think he will need admission.  I do not think he needs to be emergently cardioverted as the exact onset is unclear and he tells me he has been having palpitations for a while.  Dr. Nehemiah Settle to admit. Final Clinical Impression(s) / ED Diagnoses Final diagnoses:  New onset atrial fibrillation ([place])  Hypocalcemia    Rx / DC Orders ED Discharge Orders     None        Sherwood Gambler, MD 12/26/20 2344

## 2020-12-26 NOTE — ED Triage Notes (Signed)
PT arrives via [facility] with c/o near syncopal episode when sitting down to eat with family. No LOC per pt, did not hit head. No injuries noted. Recently hospitalized for same. EMS CBG 91. EMS reports bp was 86/56 and improved to 117/71 without intervention.

## 2020-12-26 NOTE — H&P (Signed)
History and Physical  Todd [name] BWG:[id number] DOB: Sep 18, 1955 DOA: 12/26/2020  Referring physician: Dr Regenia Skeeter, ED physician PCP: Pcp, No  Outpatient Specialists:   Patient Coming From: home  Chief Complaint: synocpe  HPI: Todd [name] is a 65 y.o. male with a history of poorly controlled diabetes with stage III chronic kidney disease, hypertension, history of BKA, hypothyroidism.  Patient brought to the hospital for witnessed syncopal episode at home by family members.  He was sitting in his wheelchair when the episode happened.  He denies preceding symptoms.  It is unclear how long the patient had lost consciousness.  He currently states that he feels well and does not have any chest pain, palpitations, shortness of breath.  He has never syncopized before and has no history of irregular heartbeat.  He was recently hospitalized both here and at [facility] for weakness and possible delirium.  His hospitalization here, it was discovered that he was not taking his Synthroid and his TSH was elevated.  He was restarted on his thyroid medication, which he states that he has been taking.  Emergency Department Course: Patient found to be in atrial fibrillation.  Creatinine 1.27, which appears to be his baseline.  Calcium 6.0, corrected to 7.4.   Review of Systems:  Pt denies any fevers, chills, nausea, vomiting, diarrhea, constipation, abdominal pain, shortness of breath, dyspnea on exertion, orthopnea, cough, wheezing, palpitations, headache, vision changes, lightheadedness, dizziness, melena, rectal bleeding.  Review of systems are otherwise negative  Past Medical History:  Diagnosis Date   AKI (acute kidney injury) ([place])    Constipated    Diabetes mellitus without complication ([place])    Diarrhea    Elevated LFTs    Goiter    Gout    Hyperlipidemia    Hypertension    Leukocytosis    Reactive thrombocytosis    Right BKA infection ([place]) 08/2016   Right leg pain    Sepsis due  to undetermined organism [facility])    Thyroid disease    Wound infection after surgery 08/2016   Past Surgical History:  Procedure Laterality Date   ABDOMINAL AORTOGRAM N/A 08/11/2016   Procedure: Abdominal Aortogram;  Surgeon: Waynetta Sandy, MD;  Location: [place] [facility];  Service: Cardiovascular;  Laterality: N/A;   ABDOMINAL AORTOGRAM W/LOWER EXTREMITY N/A 08/15/2016   Procedure: Abdominal Aortogram w/Lower Extremity;  Surgeon: Elam Dutch, MD;  Location: [place] [facility];  Service: Cardiovascular;  Laterality: N/A;   AMPUTATION Right 08/17/2016   Procedure: RIGHT BELOW KNEE AMPUTATION;  Surgeon: Elam Dutch, MD;  Location: [place];  Service: Vascular;  Laterality: Right;   AMPUTATION Right 09/12/2016   Procedure: AMPUTATION ABOVE KNEE;  Surgeon: Newt Minion, MD;  Location: [place];  Service: Orthopedics;  Laterality: Right;   AMPUTATION Left 08/12/2016   Procedure: LEFT BELOW KNEE AMPUTATION;  Surgeon: Newt Minion, MD;  Location: [place];  Service: Orthopedics;  Laterality: Left;   AMPUTATION Left 11/01/2017   Procedure: LEFT ABOVE KNEE AMPUTATION;  Surgeon: Newt Minion, MD;  Location: [place];  Service: Orthopedics;  Laterality: Left;   APPLICATION OF WOUND VAC Right 09/12/2016   Procedure: APPLICATION OF WOUND VAC ABOVE KNEE;  Surgeon: Newt Minion, MD;  Location: [place];  Service: Orthopedics;  Laterality: Right;   LOWER EXTREMITY ANGIOGRAPHY Bilateral 08/11/2016   Procedure: Lower Extremity Angiography;  Surgeon: Waynetta Sandy, MD;  Location: [place] [facility];  Service: Cardiovascular;  Laterality: Bilateral;   PERIPHERAL VASCULAR  BALLOON ANGIOPLASTY Left 08/11/2016   Procedure: Peripheral Vascular Balloon Angioplasty;  Surgeon: Waynetta Sandy, MD;  Location: [place] [facility];  Service: Cardiovascular;  Laterality: Left;  SFA   THYROID SURGERY     Social History:  reports that he has been smoking cigarettes. He has a 11.25 pack-year smoking history. He  has never used smokeless tobacco. He reports current alcohol use of about 2.0 standard drinks of alcohol per week. He reports that he does not use drugs. Patient lives at home  Allergies  Allergen Reactions   Lisinopril Other (See Comments)    Hyperkalemia / Renal failure    Family History  Problem Relation Age of Onset   Heart disease Mother    Pneumonia Father    Diabetes Maternal Aunt    Diabetes Maternal Uncle       Prior to Admission medications   Medication Sig Start Date End Date Taking? Authorizing Provider  allopurinol (ZYLOPRIM) 100 MG tablet Take 1 tablet (100 mg total) by mouth daily. 12/06/20   Murlean Iba, MD  aspirin EC 81 MG tablet Take 81 mg by mouth daily.    [provider]  atorvastatin (LIPITOR) 40 MG tablet TAKE 1 TABLET BY MOUTH ONCE DAILY AT 6 PM 12/06/20   Johnson, Clanford L, MD  blood glucose meter kit and supplies Dispense based on patient and insurance preference. Use up to four times daily as directed. (FOR ICD-10 E10.9, E11.9). 12/06/20   Johnson, Clanford L, MD  cetirizine (ZYRTEC) 10 MG tablet Take 1 tablet (10 mg total) by mouth daily as needed for allergies. For allergy symptoms 12/06/20   Irwin Brakeman L, MD  feeding supplement (ENSURE ENLIVE / ENSURE PLUS) LIQD Take 237 mLs by mouth 2 (two) times daily between meals. 12/06/20 01/05/21  Murlean Iba, MD  folic acid (FOLVITE) 1 MG tablet Take 1 tablet (1 mg total) by mouth daily. 12/06/20   Johnson, Clanford L, MD  glucose blood (ACCU-CHEK AVIVA PLUS) test strip Check blood sugars four times a day 03/12/18   [name], MD  levothyroxine (SYNTHROID) 75 MCG tablet Take 1 tablet (75 mcg total) by mouth daily before breakfast. 12/06/20   Wynetta Emery, Clanford L, MD  megestrol (MEGACE) 400 MG/10ML suspension Take 10 mLs (400 mg total) by mouth 2 (two) times daily. For appetite stimulation 12/06/20   Johnson, Clanford L, MD  sertraline (ZOLOFT) 50 MG tablet Take 1 tablet (50 mg total) by  mouth at bedtime. 12/06/20   Johnson, Clanford L, MD  sodium bicarbonate 650 MG tablet Take 1 tablet (650 mg total) by mouth 3 (three) times daily. 12/06/20   Murlean Iba, MD    Physical Exam: BP 131/78   Pulse 82   Temp 98.7 F (37.1 C) (Oral)   Resp 14   SpO2 100%   General: Elderly male. Awake and alert and oriented x3. No acute cardiopulmonary distress.  HEENT: Normocephalic atraumatic.  Right and left ears normal in appearance.  Pupils equal, round, reactive to light. Extraocular muscles are intact. Sclerae anicteric and noninjected.  Moist mucosal membranes. No mucosal lesions.  Neck: Neck supple without lymphadenopathy. No carotid bruits. No masses palpated.  Cardiovascular: Irregularly irregular rate with normal S1-S2 sounds. No murmurs, rubs, gallops auscultated. No JVD.  Respiratory: Good respiratory effort with no wheezes, rales, rhonchi. Lungs clear to auscultation bilaterally.  No accessory muscle use. Abdomen: Soft, nontender, nondistended. Active bowel sounds. No masses or hepatosplenomegaly  Skin: No rashes, lesions, or ulcerations.  Dry, warm to touch. 2+ dorsalis pedis and radial pulses. Musculoskeletal: BKD. Good ROM.  No contractures  Psychiatric: Intact judgment and insight. Pleasant and cooperative. Neurologic: No focal neurological deficits. Strength is 5/5 and symmetric in upper and lower extremities.  Cranial nerves II through XII are grossly intact.           Labs on Admission: I have personally reviewed following labs and imaging studies  CBC: Recent Labs  Lab 12/26/20 1545  WBC 7.0  NEUTROABS 4.3  HGB 9.9*  HCT 30.8*  MCV 84.6  PLT 166*   Basic Metabolic Panel: Recent Labs  Lab 12/26/20 1545  NA 131*  K 3.5  CL 105  CO2 20*  GLUCOSE 85  BUN 10  CREATININE 1.27*  CALCIUM 6.0*   GFR: CrCl cannot be calculated (Unknown ideal weight.). Liver Function Tests: Recent Labs  Lab 12/26/20 1545  AST 19  ALT 8  ALKPHOS 75  BILITOT 0.7   PROT 6.5  ALBUMIN 1.8*   No results for input(s): LIPASE, AMYLASE in the last 168 hours. No results for input(s): AMMONIA in the last 168 hours. Coagulation Profile: No results for input(s): INR, PROTIME in the last 168 hours. Cardiac Enzymes: No results for input(s): CKTOTAL, CKMB, CKMBINDEX, TROPONINI in the last 168 hours. BNP (last 3 results) No results for input(s): PROBNP in the last 8760 hours. HbA1C: No results for input(s): HGBA1C in the last 72 hours. CBG: No results for input(s): GLUCAP in the last 168 hours. Lipid Profile: No results for input(s): CHOL, HDL, LDLCALC, TRIG, CHOLHDL, LDLDIRECT in the last 72 hours. Thyroid Function Tests: No results for input(s): TSH, T4TOTAL, FREET4, T3FREE, THYROIDAB in the last 72 hours. Anemia Panel: No results for input(s): VITAMINB12, FOLATE, FERRITIN, TIBC, IRON, RETICCTPCT in the last 72 hours. Urine analysis:    Component Value Date/Time   COLORURINE YELLOW 12/26/2020 1548   APPEARANCEUR CLEAR 12/26/2020 1548   APPEARANCEUR Clear 02/09/2018 1352   LABSPEC 1.014 12/26/2020 1548   PHURINE 7.0 12/26/2020 [place] 12/26/2020 1548   HGBUR NEGATIVE 12/26/2020 [place] 12/26/2020 1548   BILIRUBINUR Negative 02/09/2018 1352   [place] 12/26/2020 1548   PROTEINUR NEGATIVE 12/26/2020 1548   NITRITE NEGATIVE 12/26/2020 1548   LEUKOCYTESUR NEGATIVE 12/26/2020 1548   Sepsis Labs: _0 (procalcitonin:4,lacticidven:4) )No results found for this or any previous visit (from the past 240 hour(s)).   Radiological Exams on Admission: DG Chest Portable 1 View  Result Date: 12/26/2020 CLINICAL DATA:  Weakness and syncopal episode yesterday. EXAM: PORTABLE CHEST 1 VIEW COMPARISON:  November 21, 2020 FINDINGS: Tortuosity and calcific atherosclerotic disease of the aorta. Postsurgical changes at the thoracic inlet. Cardiomediastinal silhouette is normal. Mediastinal contours appear intact. There  is no evidence of pleural effusion or pneumothorax. Streaky airspace opacities in the right lung base. Osseous structures are without acute abnormality. Soft tissues are grossly normal. IMPRESSION: Streaky airspace opacities in the right lung base, which may represent atelectasis or airspace consolidation. Electronically Signed   By: Fidela Salisbury M.D.   On: 12/26/2020 16:13    EKG: Independently reviewed.  Atrial fibrillation.  No acute ST changes.  Assessment/Plan: Active Problems:   Hypothyroidism   Diabetes type 2, uncontrolled (HCC)   S/P AKA (above knee amputation) bilateral (HCC)   CKD stage 3 due to type 2 diabetes mellitus ([place])   Hyponatremia   Essential hypertension, benign   Atrial fibrillation ([place])   Hypocalcemia    This patient was  discussed with the ED physician, including pertinent vitals, physical exam findings, labs, and imaging.  We also discussed care given by the ED provider.  Syncopal episode Possibly secondary to atrial fibrillation Observation on telemetry Echocardiogram in the morning Atrial fibrillation Start Eliquis Telemetry monitoring Echo in the morning This is likely secondary to hypothyroidism Hypothyroidism Continue synthroid.  [facility]SH recently [facility] -which appear to be 223 (28 on 6/25 in our system) Will repeat TSH with T3 and T4 Hypocalcemia Patient has been on sodium bicarb.  I am uncertain as to the reason for the patient being on bicarb -possibly to increase sodium levels or to increase his serum bicarb.  Unfortunately, it appears that his calcium level has dropped significantly because of this.  We will hold his sodium bicarb and recheck his electrolytes in the morning Calcium gluconate ordered. Diabetes CBGs before meals and nightly Hypertension Start metoprolol  Chronic kidney disease Hyponatremia Currently corrected  DVT prophylaxis: Start Eliquis Consultants: None Code Status: Full code Family  Communication: None Disposition Plan: Patient should be able to return home   Truett Mainland, DO

## 2020-12-26 NOTE — ED Notes (Signed)
Call placed to Department 300 to give report. Patient is assigned to room 302. Unit is not ready to take report, stated that the bed is not yet approved by the charge nurse. Message left.

## 2020-12-27 ENCOUNTER — Other Ambulatory Visit (HOSPITAL_COMMUNITY): Payer: Self-pay

## 2020-12-27 ENCOUNTER — Observation Stay (HOSPITAL_COMMUNITY): Payer: Medicare HMO

## 2020-12-27 DIAGNOSIS — E871 Hypo-osmolality and hyponatremia: Secondary | ICD-10-CM

## 2020-12-27 DIAGNOSIS — E1122 Type 2 diabetes mellitus with diabetic chronic kidney disease: Secondary | ICD-10-CM | POA: Diagnosis not present

## 2020-12-27 DIAGNOSIS — N183 Chronic kidney disease, stage 3 unspecified: Secondary | ICD-10-CM

## 2020-12-27 DIAGNOSIS — I4891 Unspecified atrial fibrillation: Secondary | ICD-10-CM

## 2020-12-27 LAB — COMPREHENSIVE METABOLIC PANEL
ALT: 8 U/L (ref 0–44)
AST: 17 U/L (ref 15–41)
Albumin: 1.6 g/dL — ABNORMAL LOW (ref 3.5–5.0)
Alkaline Phosphatase: 69 U/L (ref 38–126)
Anion gap: 5 (ref 5–15)
BUN: 13 mg/dL (ref 8–23)
CO2: 21 mmol/L — ABNORMAL LOW (ref 22–32)
Calcium: 5.8 mg/dL — CL (ref 8.9–10.3)
Chloride: 109 mmol/L (ref 98–111)
Creatinine, Ser: 1.22 mg/dL (ref 0.61–1.24)
GFR, Estimated: >60 mL/min (ref >60)
Glucose, Bld: 108 mg/dL — ABNORMAL HIGH (ref 70–99)
Potassium: 3.3 mmol/L — ABNORMAL LOW (ref 3.5–5.1)
Sodium: 135 mmol/L (ref 135–145)
Total Bilirubin: 0.6 mg/dL (ref 0.3–1.2)
Total Protein: 5.6 g/dL — ABNORMAL LOW (ref 6.5–8.1)

## 2020-12-27 LAB — GLUCOSE, CAPILLARY
Glucose-Capillary: 102 mg/dL — ABNORMAL HIGH (ref 70–99)
Glucose-Capillary: 89 mg/dL (ref 70–99)
Glucose-Capillary: 117 mg/dL — ABNORMAL HIGH (ref 70–99)
Glucose-Capillary: 114 mg/dL — ABNORMAL HIGH (ref 70–99)

## 2020-12-27 LAB — ECHOCARDIOGRAM COMPLETE
Area-P 1/2: 2.48 cm2
Height: 68 in
S' Lateral: 2.98 cm
Weight: 2052.92 oz

## 2020-12-27 LAB — T4, FREE: Free T4: 0.56 ng/dL — ABNORMAL LOW (ref 0.61–1.12)

## 2020-12-27 LAB — SARS CORONAVIRUS 2 (TAT 6-24 HRS): SARS Coronavirus 2: NEGATIVE

## 2020-12-27 MED ORDER — PERFLUTREN LIPID MICROSPHERE
1.0000 mL | INTRAVENOUS | Status: AC | PRN
  Administered 2020-12-27: 2 mL via INTRAVENOUS

## 2020-12-27 MED ORDER — POTASSIUM CHLORIDE CRYS ER 20 MEQ PO TBCR
40.0000 meq | EXTENDED_RELEASE_TABLET | ORAL | Status: AC
  Administered 2020-12-27 (×2): 40 meq via ORAL
  Filled 2020-12-27 (×2): qty 2

## 2020-12-27 MED ORDER — LEVOTHYROXINE SODIUM 100 MCG/5ML IV SOLN
100.0000 ug | Freq: Every day | INTRAVENOUS | Status: DC
  Administered 2020-12-28 – 2020-12-29 (×3): 100 ug via INTRAVENOUS
  Filled 2020-12-27 (×3): qty 5

## 2020-12-27 MED ORDER — CALCIUM CARBONATE ANTACID 500 MG PO CHEW
1.0000 | CHEWABLE_TABLET | Freq: Three times a day (TID) | ORAL | Status: DC
  Administered 2020-12-27 – 2020-12-29 (×9): 200 mg via ORAL
  Filled 2020-12-27 (×9): qty 1

## 2020-12-27 MED ORDER — DILTIAZEM HCL 30 MG PO TABS
30.0000 mg | ORAL_TABLET | Freq: Four times a day (QID) | ORAL | Status: DC
  Administered 2020-12-27 – 2020-12-29 (×10): 30 mg via ORAL
  Filled 2020-12-27 (×9): qty 1

## 2020-12-27 NOTE — Progress Notes (Signed)
  Echocardiogram 2D Echocardiogram has been performed.  Czarina Gingras G [place] 12/27/2020, 3:16 PM

## 2020-12-27 NOTE — Progress Notes (Signed)
Patient Demographics:    Todd [name], is a 65 y.o. male, DOB - Sep 17, 1955, KVQ:[id number]  Admit date - 12/26/2020   Admitting Physician Truett Mainland, DO  Outpatient Primary MD for the patient is Pcp, No  LOS - 0   Chief Complaint  Patient presents with   Near Syncope        Subjective:    Todd [name] today has no fevers, no emesis,  No chest pain,    patient's sisters  [name] and Dean at bedside  -Patient is not agreeable to lifestyle changes at this time  Assessment  & Plan :    Active Problems:   Hypothyroidism   Diabetes type 2, uncontrolled (HCC)   S/P AKA (above knee amputation) bilateral (HCC)   CKD stage 3 due to type 2 diabetes mellitus ([place])   Hyponatremia   Essential hypertension, benign   Atrial fibrillation (HCC)   Hypocalcemia   Brief Summary:-  65 year old with history of polysubstance abuse including alcohol tobacco and  almost daily cocaine use who is notoriously noncompliant with medications admitted on 12/26/2020 with concerns for syncope in the setting of severely abnormal thyroid and severe electrolyte derangement  A/p 1)Syncope-continue to watch for arrhythmias on telemetry monitored unit -Patient has ruled out for ACS by cardiac enzymes and EKG -echocardiogram without significant aortic stenosis or other outflow obstruction, EF is 55 to 60%, and also without  segmental/Regional wall motion abnormalities.  -Patient may have had an arrhythmia given ongoing cocaine and alcohol use, as well as severely abnormal thyroid functions and electrolyte derangement including hypocalcemia, hypokalemia  2) Severe hypothyroidism--patient is notoriously noncompliant,  --TSH is up to 189.8 -Free T4 is low at 0.5 -Give levothyroxine 100 mcg IV  3) social/ethics--patient technically lives alone in an apartment, continues to use cocaine and alcohol and tobacco on a daily  basis -Recently admitted to [facility] in June 2022, admitted to [facility] in July 2022 -Notoriously noncompliant -High risk for decompensation and death -Plan of care discussed with patient's sisters  [name] and Scientist, physiological at bedside -Patient is a full code  4) hypocalcemia--patient received calcium gluconate, okay to give oral replacement as well  5) hypokalemia--- replace and recheck  6) chronic atrial fibrillation--- Eliquis for anticoagulation and metoprolol for rate control  7)DM2-A1c 6.0 due to poor oral intake in the setting of cocaine and alcohol abuse----Use Novolog/Humalog Sliding scale insulin with Accu-Cheks/Fingersticks as ordered   8) recurrent AKI's--- due to dehydration, - review of lab data does Not suggest frank CKD per se -renally adjust medications, avoid nephrotoxic agents / dehydration  / hypotension  9)Etoh Abuse--lorazepam per CIWA protocol for DT prophylaxis, thiamine and folic acid as ordered   Disposition/Need for in-Hospital Stay- patient unable to be discharged at this time due to --severe electrolyte abnormalities and severely abnormal thyroid resulting in arrhythmias including A. Fib-requiring IV replacements and monitoring*  Status is: Inpatient  Remains inpatient appropriate because: Please see disposition above  Disposition: The patient is from: Home              Anticipated d/c is to: Home              Anticipated d/c date is: 1 day  Patient currently is not medically stable to d/c. Barriers: Not Clinically Stable-   Code Status : -  Code Status: Full Code   Family Communication:    (patient is alert, awake and coherent)  -Plan of care discussed with patient's sisters  [name] and Scientist, physiological at bedside Consults  :    DVT Prophylaxis  :   - SCDs/ apixaban (ELIQUIS) tablet 5 mg    Lab Results  Component Value Date   PLT 417 (H) 12/26/2020    Inpatient Medications  Scheduled Meds:  apixaban  5 mg Oral BID   aspirin EC  81  mg Oral Daily   atorvastatin  40 mg Oral QPM   calcium carbonate  1 tablet Oral TID WC   diltiazem  30 mg Oral Q6H   feeding supplement  237 mL Oral BID BM   folic acid  1 mg Oral Daily   insulin aspart  0-15 Units Subcutaneous TID WC   insulin aspart  0-5 Units Subcutaneous QHS   levothyroxine  75 mcg Oral Q0600   loratadine  10 mg Oral Daily   megestrol  400 mg Oral BID   sertraline  50 mg Oral QHS   Continuous Infusions: PRN Meds:.acetaminophen, ondansetron (ZOFRAN) IV    Anti-infectives (From admission, onward)    None         Objective:   Vitals:   12/27/20 0420 12/27/20 0539 12/27/20 0927 12/27/20 1454  BP: 130/73 115/72 108/64 114/73  Pulse: 90 83 63 66  Resp: 18 20 17 17   Temp: 98.4 F (36.9 C) 98.4 F (36.9 C) 98.2 F (36.8 C) 98.2 F (36.8 C)  TempSrc: Oral Oral Oral Oral  SpO2: 99% 100% 100% 100%  Weight:      Height:        Wt Readings from Last 3 Encounters:  12/26/20 58.2 kg  12/05/20 55.7 kg  09/23/20 72.6 kg     Intake/Output Summary (Last 24 hours) at 12/27/2020 1828 Last data filed at 12/27/2020 1300 Gross per 24 hour  Intake 1190 ml  Output 450 ml  Net 740 ml     Physical Exam  Gen:- Awake Alert,  in no apparent distress  HEENT:- [place].AT, No sclera icterus Neck-Supple Neck,No JVD,.  Lungs-  CTAB , fair symmetrical air movement CV- S1, S2 normal, irregularly irregular  abd-  +ve B.Sounds, Abd Soft, No tenderness,    Psych-affect is flat, oriented x3 Neuro-generalized weakness, no new focal deficits, no significant tremors MSK-bilateral AKA   Data Review:   Micro Results Recent Results (from the past 240 hour(s))  SARS CORONAVIRUS 2 (TAT 6-24 HRS) Nasopharyngeal Nasopharyngeal Swab     Status: None   Collection Time: 12/26/20  6:53 PM   Specimen: Nasopharyngeal Swab  Result Value Ref Range Status   SARS Coronavirus 2 NEGATIVE NEGATIVE Final    Comment: (NOTE) SARS-CoV-2 target nucleic acids are NOT DETECTED.  The  SARS-CoV-2 RNA is generally detectable in upper and lower respiratory specimens during the acute phase of infection. Negative results do not preclude SARS-CoV-2 infection, do not rule out co-infections with other pathogens, and should not be used as the sole basis for treatment or other patient management decisions. Negative results must be combined with clinical observations, patient history, and epidemiological information. The expected result is Negative.  Fact Sheet for Patients: SugarRoll.be  Fact Sheet for Healthcare Providers: https://www.woods-mathews.com/  This test is not yet approved or cleared by the [place] and  has been authorized  for detection and/or diagnosis of SARS-CoV-2 by FDA under an Emergency Use Authorization (EUA). This EUA will remain  in effect (meaning this test can be used) for the duration of the COVID-19 declaration under Se ction 564(b)(1) of the Act, 21 U.S.C. section 360bbb-3(b)(1), unless the authorization is terminated or revoked sooner.  Performed at [facility], [place] [address], [place], [place] [address]     Radiology Reports DG Chest Portable 1 View  Result Date: 12/26/2020 CLINICAL DATA:  Weakness and syncopal episode yesterday. EXAM: PORTABLE CHEST 1 VIEW COMPARISON:  November 21, 2020 FINDINGS: Tortuosity and calcific atherosclerotic disease of the aorta. Postsurgical changes at the thoracic inlet. Cardiomediastinal silhouette is normal. Mediastinal contours appear intact. There is no evidence of pleural effusion or pneumothorax. Streaky airspace opacities in the right lung base. Osseous structures are without acute abnormality. Soft tissues are grossly normal. IMPRESSION: Streaky airspace opacities in the right lung base, which may represent atelectasis or airspace consolidation. Electronically Signed   By: Fidela Salisbury M.D.   On: 12/26/2020 16:13   DG Chest Portable 1 View  Result  Date: 12/04/2020 CLINICAL DATA:  Infection, near syncope with generalized weakness. EXAM: PORTABLE CHEST 1 VIEW COMPARISON:  February 04, 2018. FINDINGS: EKG leads projecting over the chest. Cardiomediastinal contours and hilar structures are stable. Calcified atheromatous plaque of the thoracic aorta. Signs of thyroidectomy at the thoracic inlet. Lungs are clear. No signs of pneumothorax.  No effusion. On limited assessment no acute skeletal process. IMPRESSION: No acute cardiopulmonary disease. Electronically Signed   By: Zetta Bills M.D.   On: 12/04/2020 21:18   ECHOCARDIOGRAM COMPLETE  Result Date: 12/27/2020    ECHOCARDIOGRAM REPORT   Patient Name:   Todd [name] Date of Exam: 12/27/2020 Medical Rec #:  [id number]       Height:       68.0 in Accession #:    [id number]      Weight:       128.3 lb Date of Birth:  04/30/1956      BSA:          1.692 m Patient Age:    68 years        BP:           108/64 mmHg Patient Gender: M               HR:           71 bpm. Exam Location:  [facility] Procedure: 2D Echo, Cardiac Doppler, Color Doppler and Intracardiac            Opacification Agent Indications:    I48.0 Paroxysmal atrial fibrillation  History:        Patient has prior history of Echocardiogram examinations, most                 recent 02/13/2018. Risk Factors:Hypertension, Diabetes and                 Dyslipidemia. Thyroid Disease. Sepsis.  Sonographer:    Jonelle Sidle Dance Referring Phys: [id number] JACOB J STINSON  Sonographer Comments: Technically difficult study due to poor echo windows. Image acquisition challenging due to respiratory motion. IMPRESSIONS  1. Left ventricular ejection fraction, by estimation, is 55 to 60%. The left ventricle has normal function. The left ventricle has no regional wall motion abnormalities. There is mild concentric left ventricular hypertrophy. Left ventricular diastolic parameters are consistent with Grade I diastolic dysfunction (impaired relaxation).  2. Right ventricular  systolic function  is normal. The right ventricular size is normal. Tricuspid regurgitation signal is inadequate for assessing PA pressure.  3. The mitral valve is grossly normal. No evidence of mitral valve regurgitation. No evidence of mitral stenosis.  4. The aortic valve is tricuspid. Aortic valve regurgitation is not visualized. No aortic stenosis is present.  5. The inferior vena cava is normal in size with greater than 50% respiratory variability, suggesting right atrial pressure of 3 mmHg. Comparison(s): No significant change from prior study. FINDINGS  Left Ventricle: Left ventricular ejection fraction, by estimation, is 55 to 60%. The left ventricle has normal function. The left ventricle has no regional wall motion abnormalities. Definity contrast agent was given IV to delineate the left ventricular  endocardial borders. The left ventricular internal cavity size was normal in size. There is mild concentric left ventricular hypertrophy. Left ventricular diastolic parameters are consistent with Grade I diastolic dysfunction (impaired relaxation). Right Ventricle: The right ventricular size is normal. No increase in right ventricular wall thickness. Right ventricular systolic function is normal. Tricuspid regurgitation signal is inadequate for assessing PA pressure. Left Atrium: Left atrial size was normal in size. Right Atrium: Right atrial size was normal in size. Pericardium: Trivial pericardial effusion is present. Presence of pericardial fat pad. Mitral Valve: The mitral valve is grossly normal. No evidence of mitral valve regurgitation. No evidence of mitral valve stenosis. Tricuspid Valve: The tricuspid valve is grossly normal. Tricuspid valve regurgitation is trivial. No evidence of tricuspid stenosis. Aortic Valve: The aortic valve is tricuspid. Aortic valve regurgitation is not visualized. No aortic stenosis is present. Pulmonic Valve: The pulmonic valve was not well visualized. Pulmonic valve  regurgitation is not visualized. Aorta: The aortic root and ascending aorta are structurally normal, with no evidence of dilitation. Venous: The inferior vena cava is normal in size with greater than 50% respiratory variability, suggesting right atrial pressure of 3 mmHg. IAS/Shunts: The atrial septum is grossly normal.  LEFT VENTRICLE PLAX 2D LVIDd:         4.02 cm  Diastology LVIDs:         2.98 cm  LV e' medial:   4.64 cm/s LV PW:         1.40 cm  LV E/e' medial: 8.4 LV IVS:        1.31 cm LVOT diam:     2.00 cm LV SV:         30 LV SV Index:   17 LVOT Area:     3.14 cm  RIGHT VENTRICLE RV Basal diam:  2.52 cm TAPSE (M-mode): 1.9 cm LEFT ATRIUM             Index       RIGHT ATRIUM           Index LA diam:        3.10 cm 1.83 cm/m  RA Area:     14.80 cm LA Vol (A2C):   59.7 ml 35.28 ml/m RA Volume:   36.70 ml  21.69 ml/m LA Vol (A4C):   37.6 ml 22.22 ml/m LA Biplane Vol: 47.3 ml 27.95 ml/m  AORTIC VALVE LVOT Vmax:   48.80 cm/s LVOT Vmean:  33.600 cm/s LVOT VTI:    0.094 m  AORTA Ao Root diam: 3.30 cm Ao Asc diam:  3.20 cm MITRAL VALVE MV Area (PHT): 2.48 cm    SHUNTS MV Decel Time: 306 msec    Systemic VTI:  0.09 m MV E velocity: 38.80 cm/s  Systemic Diam: 2.00  cm MV A velocity: 42.70 cm/s MV E/A ratio:  0.91 Eleonore Chiquito MD Electronically signed by Eleonore Chiquito MD Signature Date/Time: 12/27/2020/3:39:42 PM    Final      CBC Recent Labs  Lab 12/26/20 1545  WBC 7.0  HGB 9.9*  HCT 30.8*  PLT 417*  MCV 84.6  MCH 27.2  MCHC 32.1  RDW 16.8*  LYMPHSABS 2.1  MONOABS 0.5  EOSABS 0.0  BASOSABS 0.0    Chemistries  Recent Labs  Lab 12/26/20 1545 12/27/20 0555  NA 131* 135  K 3.5 3.3*  CL 105 109  CO2 20* 21*  GLUCOSE 85 108*  BUN 10 13  CREATININE 1.27* 1.22  CALCIUM 6.0* 5.8*  AST 19 17  ALT 8 8  ALKPHOS 75 69  BILITOT 0.7 0.6   ------------------------------------------------------------------------------------------------------------------ No results for input(s): CHOL, HDL,  LDLCALC, TRIG, CHOLHDL, LDLDIRECT in the last 72 hours.  Lab Results  Component Value Date   HGBA1C 6.0 (H) 12/04/2020   ------------------------------------------------------------------------------------------------------------------ Recent Labs    12/26/20 1910  TSH 189.828*   ------------------------------------------------------------------------------------------------------------------ No results for input(s): VITAMINB12, FOLATE, FERRITIN, TIBC, IRON, RETICCTPCT in the last 72 hours.  Coagulation profile No results for input(s): INR, PROTIME in the last 168 hours.  No results for input(s): DDIMER in the last 72 hours.  Cardiac Enzymes No results for input(s): CKMB, TROPONINI, MYOGLOBIN in the last 168 hours.  Invalid input(s): CK ------------------------------------------------------------------------------------------------------------------    Component Value Date/Time   BNP 118.6 (H) 08/03/2016 1119   BNP 37.0 07/03/2016 2143     Emryn Flanery M.D on 12/27/2020 at 6:28 PM  Go to [email or web address] - for contact info  [facility] - Office  [phone]

## 2020-12-27 NOTE — Care Management Obs Status (Signed)
[facility] NOTIFICATION   Patient Details  Name: Todd [name] MRN: [id number] Date of Birth: [birth year]   Medicare Observation Status Notification Given:  Yes    Natasha Bence, LCSW 12/27/2020, 5:42 PM

## 2020-12-28 ENCOUNTER — Ambulatory Visit: Payer: Medicare HMO | Admitting: Physician Assistant

## 2020-12-28 DIAGNOSIS — Z9114 Patient's other noncompliance with medication regimen: Secondary | ICD-10-CM | POA: Diagnosis not present

## 2020-12-28 DIAGNOSIS — I472 Ventricular tachycardia, unspecified: Secondary | ICD-10-CM

## 2020-12-28 DIAGNOSIS — Z681 Body mass index (BMI) 19 or less, adult: Secondary | ICD-10-CM | POA: Diagnosis not present

## 2020-12-28 DIAGNOSIS — E1165 Type 2 diabetes mellitus with hyperglycemia: Secondary | ICD-10-CM | POA: Diagnosis present

## 2020-12-28 DIAGNOSIS — R55 Syncope and collapse: Secondary | ICD-10-CM | POA: Diagnosis present

## 2020-12-28 DIAGNOSIS — F1010 Alcohol abuse, uncomplicated: Secondary | ICD-10-CM | POA: Diagnosis present

## 2020-12-28 DIAGNOSIS — E1122 Type 2 diabetes mellitus with diabetic chronic kidney disease: Secondary | ICD-10-CM | POA: Diagnosis present

## 2020-12-28 DIAGNOSIS — E860 Dehydration: Secondary | ICD-10-CM | POA: Diagnosis present

## 2020-12-28 DIAGNOSIS — I480 Paroxysmal atrial fibrillation: Secondary | ICD-10-CM | POA: Diagnosis present

## 2020-12-28 DIAGNOSIS — R627 Adult failure to thrive: Secondary | ICD-10-CM | POA: Diagnosis present

## 2020-12-28 DIAGNOSIS — Z20822 Contact with and (suspected) exposure to covid-19: Secondary | ICD-10-CM | POA: Diagnosis present

## 2020-12-28 DIAGNOSIS — Z720 Tobacco use: Secondary | ICD-10-CM | POA: Diagnosis present

## 2020-12-28 DIAGNOSIS — E871 Hypo-osmolality and hyponatremia: Secondary | ICD-10-CM | POA: Diagnosis present

## 2020-12-28 DIAGNOSIS — F1410 Cocaine abuse, uncomplicated: Secondary | ICD-10-CM | POA: Diagnosis present

## 2020-12-28 DIAGNOSIS — E878 Other disorders of electrolyte and fluid balance, not elsewhere classified: Secondary | ICD-10-CM | POA: Diagnosis present

## 2020-12-28 DIAGNOSIS — Z794 Long term (current) use of insulin: Secondary | ICD-10-CM | POA: Diagnosis present

## 2020-12-28 DIAGNOSIS — N179 Acute kidney failure, unspecified: Secondary | ICD-10-CM | POA: Diagnosis present

## 2020-12-28 DIAGNOSIS — E118 Type 2 diabetes mellitus with unspecified complications: Secondary | ICD-10-CM | POA: Diagnosis present

## 2020-12-28 DIAGNOSIS — E039 Hypothyroidism, unspecified: Secondary | ICD-10-CM | POA: Diagnosis present

## 2020-12-28 DIAGNOSIS — Z89611 Acquired absence of right leg above knee: Secondary | ICD-10-CM | POA: Diagnosis not present

## 2020-12-28 DIAGNOSIS — I482 Chronic atrial fibrillation, unspecified: Secondary | ICD-10-CM | POA: Diagnosis present

## 2020-12-28 DIAGNOSIS — Z9119 Patient's noncompliance with other medical treatment and regimen: Secondary | ICD-10-CM | POA: Diagnosis not present

## 2020-12-28 DIAGNOSIS — I129 Hypertensive chronic kidney disease with stage 1 through stage 4 chronic kidney disease, or unspecified chronic kidney disease: Secondary | ICD-10-CM | POA: Diagnosis present

## 2020-12-28 DIAGNOSIS — N183 Chronic kidney disease, stage 3 unspecified: Secondary | ICD-10-CM | POA: Diagnosis present

## 2020-12-28 DIAGNOSIS — M109 Gout, unspecified: Secondary | ICD-10-CM | POA: Diagnosis present

## 2020-12-28 LAB — RENAL FUNCTION PANEL
Albumin: 1.6 g/dL — ABNORMAL LOW (ref 3.5–5.0)
Anion gap: 5 (ref 5–15)
BUN: 12 mg/dL (ref 8–23)
CO2: 18 mmol/L — ABNORMAL LOW (ref 22–32)
Calcium: 5.8 mg/dL — CL (ref 8.9–10.3)
Chloride: 112 mmol/L — ABNORMAL HIGH (ref 98–111)
Creatinine, Ser: 1.28 mg/dL — ABNORMAL HIGH (ref 0.61–1.24)
GFR, Estimated: >60 mL/min (ref >60)
Glucose, Bld: 111 mg/dL — ABNORMAL HIGH (ref 70–99)
Phosphorus: <1 mg/dL — CL (ref 2.5–4.6)
Potassium: 3.7 mmol/L (ref 3.5–5.1)
Sodium: 135 mmol/L (ref 135–145)

## 2020-12-28 LAB — CBC
HCT: 25.9 % — ABNORMAL LOW (ref 39.0–52.0)
Hemoglobin: 8.3 g/dL — ABNORMAL LOW (ref 13.0–17.0)
MCH: 27.4 pg (ref 26.0–34.0)
MCHC: 32 g/dL (ref 30.0–36.0)
MCV: 85.5 fL (ref 80.0–100.0)
Platelets: 382 10*3/uL (ref 150–400)
RBC: 3.03 MIL/uL — ABNORMAL LOW (ref 4.22–5.81)
RDW: 17.4 % — ABNORMAL HIGH (ref 11.5–15.5)
WBC: 8.3 10*3/uL (ref 4.0–10.5)
nRBC: 0 % (ref 0.0–0.2)

## 2020-12-28 LAB — GLUCOSE, CAPILLARY
Glucose-Capillary: 117 mg/dL — ABNORMAL HIGH (ref 70–99)
Glucose-Capillary: 109 mg/dL — ABNORMAL HIGH (ref 70–99)
Glucose-Capillary: 93 mg/dL (ref 70–99)
Glucose-Capillary: 112 mg/dL — ABNORMAL HIGH (ref 70–99)

## 2020-12-28 LAB — MAGNESIUM: Magnesium: 1.1 mg/dL — ABNORMAL LOW (ref 1.7–2.4)

## 2020-12-28 LAB — T3, FREE: T3, Free: 1 pg/mL — ABNORMAL LOW (ref 2.0–4.4)

## 2020-12-28 MED ORDER — CALCIUM GLUCONATE-NACL 1-0.675 GM/50ML-% IV SOLN
1.0000 g | Freq: Once | INTRAVENOUS | Status: AC
  Administered 2020-12-28: 1000 mg via INTRAVENOUS
  Filled 2020-12-28: qty 50

## 2020-12-28 MED ORDER — POTASSIUM PHOSPHATES 15 MMOLE/5ML IV SOLN
30.0000 mmol | Freq: Once | INTRAVENOUS | Status: AC
  Administered 2020-12-28: 30 mmol via INTRAVENOUS
  Filled 2020-12-28: qty 10

## 2020-12-28 MED ORDER — MAGNESIUM SULFATE 2 GM/50ML IV SOLN
2.0000 g | Freq: Once | INTRAVENOUS | Status: AC
  Administered 2020-12-28: 2 g via INTRAVENOUS
  Filled 2020-12-28: qty 50

## 2020-12-28 MED ORDER — K PHOS MONO-SOD PHOS DI & MONO 155-852-130 MG PO TABS
500.0000 mg | ORAL_TABLET | Freq: Three times a day (TID) | ORAL | Status: DC
  Administered 2020-12-28 – 2020-12-29 (×5): 500 mg via ORAL
  Filled 2020-12-28 (×5): qty 2

## 2020-12-28 MED ORDER — MAGNESIUM SULFATE 4 GM/100ML IV SOLN
4.0000 g | Freq: Once | INTRAVENOUS | Status: AC
  Administered 2020-12-28: 4 g via INTRAVENOUS
  Filled 2020-12-28: qty 100

## 2020-12-28 NOTE — Progress Notes (Addendum)
Patient Demographics:    Todd [name], is a 65 y.o. male, DOB - May 18, 1956, [id number]  Admit date - 12/26/2020   Admitting Physician No admitting provider for patient encounter.  Outpatient Primary MD for the patient is Pcp, No  LOS - 0   Chief Complaint  Patient presents with   Near Syncope        Subjective:    Todd [name] today has no fevers, no emesis,  No chest pain,   -Patient had episode of nonsustained V. tach overnight -Complains of fatigue and weakness  Assessment  & Plan :    Principal Problem:   Syncope and collapse-suspect arrhythmia related in the setting of severe electrolyte abnormalities Active Problems:   Severe Hypophosphatemia   Non-Sustained V-tach in the setting of Severe Electrolyte Derangement   Cocaine abuse -on-going   Alcohol abuse, continuous   Diabetes mellitus type 2 with complications (HCC)   Tobacco abuse   Hypothyroidism   Diabetes type 2, uncontrolled (HCC)   S/P AKA (above knee amputation) bilateral (HCC)   Hyponatremia   Essential hypertension, benign   Atrial fibrillation (HCC)   Hypocalcemia   Brief Summary:-  65 year old with history of polysubstance abuse including alcohol tobacco and  almost daily cocaine use who is notoriously noncompliant with medications admitted on 12/26/2020 with concerns for syncope in the setting of severely abnormal thyroid and severe electrolyte derangement  A/p 1)Syncope-episode of nonsustained V. tach overnight -most likely related to severe electrolyte derangement including hypophosphatemia, hypocalcemia and severely abnormal thyroid -Potassium has been repleted -Check magnesium continue to watch for arrhythmias on telemetry monitored unit -Patient has ruled out for ACS by cardiac enzymes and EKG -echocardiogram without significant aortic stenosis or other outflow obstruction, EF is 55 to 60%, and also  without  segmental/Regional wall motion abnormalities.  - 2) Severe hypothyroidism--patient is notoriously noncompliant,  --TSH is up to 189.8 -Free T4 is low at 0.5 -Continue levothyroxine 100 mcg IV  3) social/ethics--patient technically lives alone in an apartment, continues to use cocaine and alcohol and tobacco on a daily basis -Recently admitted to [facility] in June 2022, admitted to [facility] in July 2022 -Notoriously noncompliant -High risk for decompensation and death -Plan of care discussed with patient's sisters  [name] and Scientist, physiological at bedside -Patient is a full code  4) severe hypophosphatemia and  hypocalcemia and hypokalemia----severe electrolyte derangement and severely abnormal thyroid probably contributing to patient's arrhythmias including V. tach and syncope ---replace IV and orally  5) nonsustained V. tach --due to severe electrolyte abnormalities and severely abnormal thyroid -----please see #1 and #4 above  6) chronic atrial fibrillation--- Eliquis for anticoagulation and metoprolol for rate control  7)DM2-A1c 6.0 reflecting excellent diabetic control PTA, but patient sugars are probably not elevated also because patient really does not eat much he drinks a lot of alcohol and just does not eat much ---Use Novolog/Humalog Sliding scale insulin with Accu-Cheks/Fingersticks as ordered   8) recurrent AKI's--- due to dehydration, - review of lab data does Not suggest frank CKD per se -renally adjust medications, avoid nephrotoxic agents / dehydration  / hypotension  9)Polysubstance abuse + Etoh Abuse--patient uses alcohol, tobacco and cocaine almost on a daily basis -lorazepam per CIWA protocol for DT  prophylaxis, thiamine and folic acid as ordered --Patient states he is agreeable to going to inpatient rehab for drugs and alcohol -Please see documentation from social worker Iona Beard dated 12/28/20  10) generalized weakness and deconditioning--- most  likely related to severe electrolyte abnormalities including severe hypophosphatemia, hypocalcemia and hypokalemia as well as severely abnormal thyroid -Replace electrolytes as above in #4  11)Anorexia/FTT--- multifactorial, resulting in severe electrolyte abnormalities, -Megace for appetite stimulation as ordered  Disposition/Need for in-Hospital Stay- patient unable to be discharged at this time due to --severe electrolyte abnormalities and severely abnormal thyroid resulting in arrhythmias including A. Fib/Vtach-requiring IV replacements and monitoring*  Status is: Inpatient  Remains inpatient appropriate because: Please see disposition above  Disposition: The patient is from: Home              Anticipated d/c is to: Home with HH Vs IP drug and alcohol rehab              Anticipated d/c date is: 1 day              Patient currently is not medically stable to d/c. Barriers: Not Clinically Stable-   Code Status : -  Code Status: Full Code   Family Communication:    (patient is alert, awake and coherent)  -Plan of care discussed with patient's sisters  [name] and Scientist, physiological at bedside Consults  :    DVT Prophylaxis  :   - SCDs/ apixaban (ELIQUIS) tablet 5 mg    Lab Results  Component Value Date   PLT 382 12/28/2020    Inpatient Medications  Scheduled Meds:  apixaban  5 mg Oral BID   aspirin EC  81 mg Oral Daily   atorvastatin  40 mg Oral QPM   calcium carbonate  1 tablet Oral TID WC   diltiazem  30 mg Oral Q6H   feeding supplement  237 mL Oral BID BM   folic acid  1 mg Oral Daily   insulin aspart  0-15 Units Subcutaneous TID WC   insulin aspart  0-5 Units Subcutaneous QHS   levothyroxine  100 mcg Intravenous Daily   loratadine  10 mg Oral Daily   megestrol  400 mg Oral BID   phosphorus  500 mg Oral TID   sertraline  50 mg Oral QHS   Continuous Infusions:  magnesium sulfate bolus IVPB     potassium PHOSPHATE IVPB (in mmol) 30 mmol (12/28/20 1151)   PRN  Meds:.acetaminophen, ondansetron (ZOFRAN) IV    Anti-infectives (From admission, onward)    None         Objective:   Vitals:   12/27/20 2122 12/28/20 0214 12/28/20 0519 12/28/20 1435  BP: 100/63 128/76 113/68 121/70  Pulse: 70 75 75 75  Resp: 17  17 16   Temp: 98.3 F (36.8 C) 98.7 F (37.1 C) 98.1 F (36.7 C) 98 F (36.7 C)  TempSrc: Oral Oral Oral Oral  SpO2: 100% 98% 100% 91%  Weight:      Height:        Wt Readings from Last 3 Encounters:  12/26/20 58.2 kg  12/05/20 55.7 kg  09/23/20 72.6 kg     Intake/Output Summary (Last 24 hours) at 12/28/2020 1502 Last data filed at 12/28/2020 1300 Gross per 24 hour  Intake 1060 ml  Output 500 ml  Net 560 ml   Physical Exam  Gen:- Awake Alert,  in no apparent distress  HEENT:- [place].AT, No sclera icterus Neck-Supple Neck,No JVD,.  Lungs-  CTAB , fair symmetrical air movement CV- S1, S2 normal, irregularly irregular  abd-  +ve B.Sounds, Abd Soft, No tenderness,    Psych-affect is flat, oriented x3 Neuro-generalized weakness, no new focal deficits, no significant tremors MSK-bilateral AKA   Data Review:   Micro Results Recent Results (from the past 240 hour(s))  SARS CORONAVIRUS 2 (TAT 6-24 HRS) Nasopharyngeal Nasopharyngeal Swab     Status: None   Collection Time: 12/26/20  6:53 PM   Specimen: Nasopharyngeal Swab  Result Value Ref Range Status   SARS Coronavirus 2 NEGATIVE NEGATIVE Final    Comment: (NOTE) SARS-CoV-2 target nucleic acids are NOT DETECTED.  The SARS-CoV-2 RNA is generally detectable in upper and lower respiratory specimens during the acute phase of infection. Negative results do not preclude SARS-CoV-2 infection, do not rule out co-infections with other pathogens, and should not be used as the sole basis for treatment or other patient management decisions. Negative results must be combined with clinical observations, patient history, and epidemiological information. The expected result is  Negative.  Fact Sheet for Patients: SugarRoll.be  Fact Sheet for Healthcare Providers: https://www.woods-mathews.com/  This test is not yet approved or cleared by the [place] FDA and  has been authorized for detection and/or diagnosis of SARS-CoV-2 by FDA under an Emergency Use Authorization (EUA). This EUA will remain  in effect (meaning this test can be used) for the duration of the COVID-19 declaration under Se ction 564(b)(1) of the Act, 21 U.S.C. section 360bbb-3(b)(1), unless the authorization is terminated or revoked sooner.  Performed at [facility], [place] [address] [place], [place] [address]     Radiology Reports DG Chest Portable 1 View  Result Date: 12/26/2020 CLINICAL DATA:  Weakness and syncopal episode yesterday. EXAM: PORTABLE CHEST 1 VIEW COMPARISON:  November 21, 2020 FINDINGS: Tortuosity and calcific atherosclerotic disease of the aorta. Postsurgical changes at the thoracic inlet. Cardiomediastinal silhouette is normal. Mediastinal contours appear intact. There is no evidence of pleural effusion or pneumothorax. Streaky airspace opacities in the right lung base. Osseous structures are without acute abnormality. Soft tissues are grossly normal. IMPRESSION: Streaky airspace opacities in the right lung base, which may represent atelectasis or airspace consolidation. Electronically Signed   By: Fidela Salisbury M.D.   On: 12/26/2020 16:13   DG Chest Portable 1 View  Result Date: 12/04/2020 CLINICAL DATA:  Infection, near syncope with generalized weakness. EXAM: PORTABLE CHEST 1 VIEW COMPARISON:  February 04, 2018. FINDINGS: EKG leads projecting over the chest. Cardiomediastinal contours and hilar structures are stable. Calcified atheromatous plaque of the thoracic aorta. Signs of thyroidectomy at the thoracic inlet. Lungs are clear. No signs of pneumothorax.  No effusion. On limited assessment no acute skeletal process.  IMPRESSION: No acute cardiopulmonary disease. Electronically Signed   By: Zetta Bills M.D.   On: 12/04/2020 21:18   ECHOCARDIOGRAM COMPLETE  Result Date: 12/27/2020    ECHOCARDIOGRAM REPORT   Patient Name:   DAITON COWLES Date of Exam: 12/27/2020 Medical Rec #:  [id number]       Height:       68.0 in Accession #:    [id number]      Weight:       128.3 lb Date of Birth:  [birth year]      BSA:          1.692 m Patient Age:    75 years        BP:           108/64  mmHg Patient Gender: M               HR:           71 bpm. Exam Location:  [facility] Procedure: 2D Echo, Cardiac Doppler, Color Doppler and Intracardiac            Opacification Agent Indications:    I48.0 Paroxysmal atrial fibrillation  History:        Patient has prior history of Echocardiogram examinations, most                 recent 02/13/2018. Risk Factors:Hypertension, Diabetes and                 Dyslipidemia. Thyroid Disease. Sepsis.  Sonographer:    Jonelle Sidle Dance Referring Phys: [id number] JACOB J STINSON  Sonographer Comments: Technically difficult study due to poor echo windows. Image acquisition challenging due to respiratory motion. IMPRESSIONS  1. Left ventricular ejection fraction, by estimation, is 55 to 60%. The left ventricle has normal function. The left ventricle has no regional wall motion abnormalities. There is mild concentric left ventricular hypertrophy. Left ventricular diastolic parameters are consistent with Grade I diastolic dysfunction (impaired relaxation).  2. Right ventricular systolic function is normal. The right ventricular size is normal. Tricuspid regurgitation signal is inadequate for assessing PA pressure.  3. The mitral valve is grossly normal. No evidence of mitral valve regurgitation. No evidence of mitral stenosis.  4. The aortic valve is tricuspid. Aortic valve regurgitation is not visualized. No aortic stenosis is present.  5. The inferior vena cava is normal in size with greater than 50% respiratory variability,  suggesting right atrial pressure of 3 mmHg. Comparison(s): No significant change from prior study. FINDINGS  Left Ventricle: Left ventricular ejection fraction, by estimation, is 55 to 60%. The left ventricle has normal function. The left ventricle has no regional wall motion abnormalities. Definity contrast agent was given IV to delineate the left ventricular  endocardial borders. The left ventricular internal cavity size was normal in size. There is mild concentric left ventricular hypertrophy. Left ventricular diastolic parameters are consistent with Grade I diastolic dysfunction (impaired relaxation). Right Ventricle: The right ventricular size is normal. No increase in right ventricular wall thickness. Right ventricular systolic function is normal. Tricuspid regurgitation signal is inadequate for assessing PA pressure. Left Atrium: Left atrial size was normal in size. Right Atrium: Right atrial size was normal in size. Pericardium: Trivial pericardial effusion is present. Presence of pericardial fat pad. Mitral Valve: The mitral valve is grossly normal. No evidence of mitral valve regurgitation. No evidence of mitral valve stenosis. Tricuspid Valve: The tricuspid valve is grossly normal. Tricuspid valve regurgitation is trivial. No evidence of tricuspid stenosis. Aortic Valve: The aortic valve is tricuspid. Aortic valve regurgitation is not visualized. No aortic stenosis is present. Pulmonic Valve: The pulmonic valve was not well visualized. Pulmonic valve regurgitation is not visualized. Aorta: The aortic root and ascending aorta are structurally normal, with no evidence of dilitation. Venous: The inferior vena cava is normal in size with greater than 50% respiratory variability, suggesting right atrial pressure of 3 mmHg. IAS/Shunts: The atrial septum is grossly normal.  LEFT VENTRICLE PLAX 2D LVIDd:         4.02 cm  Diastology LVIDs:         2.98 cm  LV e' medial:   4.64 cm/s LV PW:         1.40 cm  LV E/e'  medial: 8.4 LV IVS:  1.31 cm LVOT diam:     2.00 cm LV SV:         30 LV SV Index:   17 LVOT Area:     3.14 cm  RIGHT VENTRICLE RV Basal diam:  2.52 cm TAPSE (M-mode): 1.9 cm LEFT ATRIUM             Index       RIGHT ATRIUM           Index LA diam:        3.10 cm 1.83 cm/m  RA Area:     14.80 cm LA Vol (A2C):   59.7 ml 35.28 ml/m RA Volume:   36.70 ml  21.69 ml/m LA Vol (A4C):   37.6 ml 22.22 ml/m LA Biplane Vol: 47.3 ml 27.95 ml/m  AORTIC VALVE LVOT Vmax:   48.80 cm/s LVOT Vmean:  33.600 cm/s LVOT VTI:    0.094 m  AORTA Ao Root diam: 3.30 cm Ao Asc diam:  3.20 cm MITRAL VALVE MV Area (PHT): 2.48 cm    SHUNTS MV Decel Time: 306 msec    Systemic VTI:  0.09 m MV E velocity: 38.80 cm/s  Systemic Diam: 2.00 cm MV A velocity: 42.70 cm/s MV E/A ratio:  0.91 Eleonore Chiquito MD Electronically signed by Eleonore Chiquito MD Signature Date/Time: 12/27/2020/3:39:42 PM    Final      CBC Recent Labs  Lab 12/26/20 1545 12/28/20 0858  WBC 7.0 8.3  HGB 9.9* 8.3*  HCT 30.8* 25.9*  PLT 417* 382  MCV 84.6 85.5  MCH 27.2 27.4  MCHC 32.1 32.0  RDW 16.8* 17.4*  LYMPHSABS 2.1  --   MONOABS 0.5  --   EOSABS 0.0  --   BASOSABS 0.0  --     Chemistries  Recent Labs  Lab 12/26/20 1545 12/27/20 0555 12/28/20 0858  NA 131* 135 135  K 3.5 3.3* 3.7  CL 105 109 112*  CO2 20* 21* 18*  GLUCOSE 85 108* 111*  BUN 10 13 12   CREATININE 1.27* 1.22 1.28*  CALCIUM 6.0* 5.8* 5.8*  AST 19 17  --   ALT 8 8  --   ALKPHOS 75 69  --   BILITOT 0.7 0.6  --    ------------------------------------------------------------------------------------------------------------------ No results for input(s): CHOL, HDL, LDLCALC, TRIG, CHOLHDL, LDLDIRECT in the last 72 hours.  Lab Results  Component Value Date   HGBA1C 6.0 (H) 12/04/2020    Recent Labs    12/26/20 1910  TSH 189.828*  T3FREE 1.0*   ------------------------------------------------------------------------------------------------------------------ No  results for input(s): VITAMINB12, FOLATE, FERRITIN, TIBC, IRON, RETICCTPCT in the last 72 hours.  Coagulation profile No results for input(s): INR, PROTIME in the last 168 hours.  No results for input(s): DDIMER in the last 72 hours.  Cardiac Enzymes No results for input(s): CKMB, TROPONINI, MYOGLOBIN in the last 168 hours.  Invalid input(s): CK ------------------------------------------------------------------------------------------------------------------    Component Value Date/Time   BNP 118.6 (H) 08/03/2016 1119   BNP 37.0 07/03/2016 2143   Mahari Strahm M.D on 12/28/2020 at 3:02 PM  Go to [email or web address] - for contact info  [facility] - Office  [phone]

## 2020-12-28 NOTE — Plan of Care (Signed)
  Problem: Education: Goal: Knowledge of General Education information will improve Description Including pain rating scale, medication(s)/side effects and non-pharmacologic comfort measures Outcome: Progressing   

## 2020-12-28 NOTE — TOC Initial Note (Signed)
Transition of Care [facility]) - Initial/Assessment Note    Patient Details  Name: Todd [name] MRN: [id number] Date of Birth: 1956/01/24  [facility]) CM/SW Contact:    Iona Beard, [place] Phone Number: 12/28/2020, 1:17 PM  Clinical Narrative:                 [facility] consulted for substance abuse education, TOC also aware that there is a note that pts family would like pt to go to inpatient rehab for his substance use. CSW informed pts sisters that TOC would need to speak with pt first as he is able to make his own decisions. CSW spoke with pt in room to complete assessment. Pt states that he has been living alone and is independent in completing his ADLs. Pt does not drive but has transportation provided by [employer]. Pt has had [place] services in the past but is unsure what company was used. Pt uses a wheelchair. CSW inquired about pts substance use, pt states he drinks some but does not state any use of other substances. CSW spoke with pt about his sisters requesting we look into inpatient rehab dor his substance use. Pt states that he may be interested in this and is agreeable for CSW to look into this further.   CSW spoke with rep at [facility], they requested CSW fax H&P and pt demographics to [phone]. CSW informed that pt would need to complete a pre screen assessment over the phone, CSW assisted pt in making the phone call in room and CSW will f/u with [facility] to see how assessment went. TOC to follow.   Expected Discharge Plan: IP Rehab Facility Barriers to Discharge: Continued Medical Work up   Patient Goals and CMS Choice Patient states their goals for this hospitalization and ongoing recovery are:: Inpatient rehab if accepted CMS Medicare.gov Compare Post Acute Care list provided to:: Patient Choice offered to / list presented to : Patient  Expected Discharge Plan and Services Expected Discharge Plan: [place] In-house Referral: Clinical Social Work Discharge Planning  Services: CM Consult   Living arrangements for the past 2 months: Apartment Expected Discharge Date: 12/28/20                                    Prior Living Arrangements/Services Living arrangements for the past 2 months: Apartment Lives with:: Self Patient language and need for interpreter reviewed:: Yes Do you feel safe going back to the place where you live?: Yes      Need for Family Participation in Patient Care: Yes (Comment) Care giver support system in place?: Yes (comment) Current home services: DME Criminal Activity/Legal Involvement Pertinent to Current Situation/Hospitalization: No - Comment as needed  Activities of Daily Living Home Assistive Devices/Equipment: Wheelchair, [facility] bed ADL Screening (condition at time of admission) Patient's cognitive ability adequate to safely complete daily activities?: Yes Is the patient deaf or have difficulty hearing?: No Does the patient have difficulty seeing, even when wearing glasses/contacts?: No Does the patient have difficulty concentrating, remembering, or making decisions?: No Patient able to express need for assistance with ADLs?: Yes Does the patient have difficulty dressing or bathing?: No (Has to have help getting around due to bilateral BKA) Independently performs ADLs?: Yes (appropriate for developmental age) Communication: Independent Dressing (OT): Independent Grooming: Independent Feeding: Independent Bathing: Needs assistance Is this a change from baseline?: Pre-admission baseline Toileting: Needs assistance Is this  a change from baseline?: Pre-admission baseline In/Out Bed: Independent with device (comment) (with trapeze bar and wheelchair) Is this a change from baseline?: Pre-admission baseline Walks in Home: Independent with device (comment) (wheelchair (bilateral BKA)) Is this a change from baseline?: Pre-admission baseline Does the patient have difficulty walking or climbing stairs?:  Yes Weakness of Legs: Both Weakness of Arms/Hands: None  Permission Sought/Granted Permission sought to share information with : Family Supports Permission granted to share information with : Yes, Verbal Permission Granted  Share Information with NAME: Sisters           Emotional Assessment Appearance:: Appears stated age Attitude/Demeanor/Rapport: Engaged Affect (typically observed): Accepting Orientation: : Oriented to Self, Oriented to Place, Oriented to  Time, Oriented to Situation Alcohol / Substance Use: Not Applicable Psych Involvement: No (comment)  Admission diagnosis:  Hypocalcemia [E83.51] Atrial fibrillation ([place]) [I48.91] New onset atrial fibrillation [facility]) [I48.91] Patient Active Problem List   Diagnosis Date Noted   Atrial fibrillation ([place]) 12/26/2020   Hypocalcemia 45/40/9811   Acute metabolic encephalopathy 91/47/8295   Hypoglycemia 12/05/2020   AKI (acute kidney injury) ([place]) 12/04/2020   Vitamin D deficiency 06/17/2019   Essential hypertension, benign 08/30/2018   Mixed hyperlipidemia 07/17/2018   CKD stage 3 due to type 2 diabetes mellitus ([place]) 02/14/2018   Hyponatremia 02/14/2018   Medically noncompliant 02/14/2018   S/P AKA (above knee amputation) bilateral ([place]) 02/12/2018   Current smoker 02/03/2018   Leukocytosis 02/03/2018   Reactive thrombocytosis 02/03/2018   Urinary incontinence 08/14/2017   Transaminitis    Type 2 diabetes mellitus with stage 4 chronic kidney disease, with long-term current use of insulin ([place]) 07/21/2016   Hyperuricemia 06/21/2016   HTN (hypertension) 01/05/2016   Diabetes type 2, uncontrolled ([place]) 01/05/2016   Hypothyroidism 05/10/2011   PCP:  Pcp, No Pharmacy:   [facility], [place] [address] Phone: [phone] Fax: [phone]  [facility], [place] [address] Phone: [phone] Fax:  [phone]  [facility] [address] [place] [address] [place] [address] Phone: [phone] Fax: [phone]  [id number] [place], [place] [address] Phone: [phone] Fax: [phone]     Social Determinants of Health (SDOH) Interventions    Readmission Risk Interventions No flowsheet data found.

## 2020-12-28 NOTE — Plan of Care (Signed)

## 2020-12-29 LAB — RENAL FUNCTION PANEL
Albumin: 1.7 g/dL — ABNORMAL LOW (ref 3.5–5.0)
Anion gap: 9 (ref 5–15)
BUN: 13 mg/dL (ref 8–23)
CO2: 18 mmol/L — ABNORMAL LOW (ref 22–32)
Calcium: 5.9 mg/dL — CL (ref 8.9–10.3)
Chloride: 108 mmol/L (ref 98–111)
Creatinine, Ser: 1.25 mg/dL — ABNORMAL HIGH (ref 0.61–1.24)
GFR, Estimated: >60 mL/min (ref >60)
Glucose, Bld: 135 mg/dL — ABNORMAL HIGH (ref 70–99)
Phosphorus: 4 mg/dL (ref 2.5–4.6)
Potassium: 3.9 mmol/L (ref 3.5–5.1)
Sodium: 135 mmol/L (ref 135–145)

## 2020-12-29 LAB — GLUCOSE, CAPILLARY
Glucose-Capillary: 97 mg/dL (ref 70–99)
Glucose-Capillary: 92 mg/dL (ref 70–99)
Glucose-Capillary: 128 mg/dL — ABNORMAL HIGH (ref 70–99)

## 2020-12-29 LAB — MAGNESIUM: Magnesium: 2.8 mg/dL — ABNORMAL HIGH (ref 1.7–2.4)

## 2020-12-29 MED ORDER — K PHOS MONO-SOD PHOS DI & MONO 155-852-130 MG PO TABS: 250.0000 mg | ORAL_TABLET | Freq: Two times a day (BID) | ORAL | 0 refills | Status: DC

## 2020-12-29 MED ORDER — APIXABAN 5 MG PO TABS
5.0000 mg | ORAL_TABLET | Freq: Two times a day (BID) | ORAL | 5 refills | Status: DC
Start: 2020-12-29 — End: 2021-01-15

## 2020-12-29 MED ORDER — FOLIC ACID 1 MG PO TABS
1.0000 mg | ORAL_TABLET | Freq: Every day | ORAL | 5 refills | Status: DC
Start: 2020-12-29 — End: 2022-05-03

## 2020-12-29 MED ORDER — CALCIUM CARBONATE ANTACID 500 MG PO CHEW: 1.0000 | CHEWABLE_TABLET | Freq: Two times a day (BID) | ORAL | 4 refills | Status: DC

## 2020-12-29 MED ORDER — METOPROLOL SUCCINATE ER 25 MG PO TB24: 25.0000 mg | ORAL_TABLET | Freq: Every day | ORAL | 11 refills | Status: DC

## 2020-12-29 MED ORDER — LEVOTHYROXINE SODIUM 150 MCG PO TABS: 150.0000 ug | ORAL_TABLET | Freq: Every day | ORAL | 11 refills | Status: DC

## 2020-12-29 MED ORDER — THIAMINE HCL 100 MG PO TABS: 100.0000 mg | ORAL_TABLET | Freq: Every day | ORAL | 3 refills | Status: AC

## 2020-12-29 MED ORDER — SODIUM BICARBONATE 650 MG PO TABS: 650.0000 mg | ORAL_TABLET | Freq: Three times a day (TID) | ORAL | 1 refills | Status: DC

## 2020-12-29 NOTE — Plan of Care (Signed)
  Problem: Education: Goal: Knowledge of General Education information will improve Description: Including pain rating scale, medication(s)/side effects and non-pharmacologic comfort measures 12/29/2020 1828 by Emmaline Life, RN Outcome: Adequate for Discharge 12/29/2020 1140 by Emmaline Life, RN Outcome: Progressing   Problem: Health Behavior/Discharge Planning: Goal: Ability to manage health-related needs will improve 12/29/2020 1828 by Emmaline Life, RN Outcome: Adequate for Discharge 12/29/2020 1140 by Emmaline Life, RN Outcome: Progressing   Problem: Clinical Measurements: Goal: Ability to maintain clinical measurements within normal limits will improve Outcome: Adequate for Discharge Goal: Will remain free from infection Outcome: Adequate for Discharge Goal: Diagnostic test results will improve Outcome: Adequate for Discharge Goal: Respiratory complications will improve Outcome: Adequate for Discharge Goal: Cardiovascular complication will be avoided Outcome: Adequate for Discharge   Problem: Activity: Goal: Risk for activity intolerance will decrease Outcome: Adequate for Discharge   Problem: Nutrition: Goal: Adequate nutrition will be maintained Outcome: Adequate for Discharge   Problem: Coping: Goal: Level of anxiety will decrease Outcome: Adequate for Discharge   Problem: Elimination: Goal: Will not experience complications related to bowel motility Outcome: Adequate for Discharge Goal: Will not experience complications related to urinary retention Outcome: Adequate for Discharge   Problem: Pain Managment: Goal: General experience of comfort will improve Outcome: Adequate for Discharge   Problem: Safety: Goal: Ability to remain free from injury will improve Outcome: Adequate for Discharge   Problem: Skin Integrity: Goal: Risk for impaired skin integrity will decrease Outcome: Adequate for Discharge

## 2020-12-29 NOTE — Progress Notes (Signed)
Discharge instructions, RX's and follow appts explained and provided to patient verbalized understanding. Dr and Social work reviewed discharge plan with patient's sisters. -patient left via stretcher transported by [facility].   Joslyn Ramos, Tivis Ringer, RN

## 2020-12-29 NOTE — Discharge Instructions (Addendum)
1)Avoid ibuprofen/Advil/Aleve/Motrin/Goody Powders/Naproxen/BC powders/Meloxicam/Diclofenac/Indomethacin and other Nonsteroidal anti-inflammatory medications as these will make you more likely to bleed and can cause stomach ulcers, can also cause Kidney problems.   2)You are taking Eliquis/Apixaban which is your blood thinner so please Avoid the medications listed above in #1 actually make you more likely to bleed  3)Complete Abstinence from Alcohol, Cocaine and other illicit drugs strongly advised  4)Please take your medications as prescribed  5)Drug and alcohol rehab program which can be done as an outpatient or inpatient is strongly recommended as discussed  6)Repeat renal panel blood tests/Chem 8 blood test in about 3 to 6 days advised  7)There has been several changes to medications--please take your medications exactly as prescribed to avoid complications  8)follow up with  Endocrinologist Dr. Loni Beckwith, MD, Endocrinology, Diabetes & Metabolism-- [facility], [address] [place], [place] [address], Phone Number- [phone] for your Thyroid Problems--You Need repeat TSH and Free T4 Test n 3 weeks  9)follow up cardiology services Ms. Bernerd Pho in a couple of weeks due to irregular heartbeat--

## 2020-12-29 NOTE — Discharge Summary (Signed)
Todd [name], is a 65 y.o. male  DOB June 15, 1955  MRN [id number].  Admission date:  12/26/2020  Admitting Physician  Akilah Cureton Denton Brick, MD  Discharge Date:  12/29/2020   Primary MD  Pcp, No  Recommendations for primary care physician for things to follow:   1)Avoid ibuprofen/Advil/Aleve/Motrin/Goody Powders/Naproxen/BC powders/Meloxicam/Diclofenac/Indomethacin and other Nonsteroidal anti-inflammatory medications as these will make you more likely to bleed and can cause stomach ulcers, can also cause Kidney problems.   2)You are taking Eliquis/Apixaban which is your blood thinner so please Avoid the medications listed above in #1 actually make you more likely to bleed  3)Complete Abstinence from Alcohol, Cocaine and other illicit drugs strongly advised  4)Please take your medications as prescribed  5)Drug and alcohol rehab program which can be done as an outpatient or inpatient is strongly recommended as discussed  6)Repeat renal panel blood tests/Chem 8 blood test in about 3 to 6 days advised  7)There has been several changes to medications--please take your medications exactly as prescribed to avoid complications  8)follow up with  Endocrinologist Dr. Loni Beckwith, MD, Endocrinology, Diabetes & Metabolism-- [facility], [address] [place], [place] [address], Phone Number- [phone] for your Thyroid Problems--You Need repeat TSH and Free T4 Test n 3 weeks  9)follow up cardiology services Ms. Bernerd Pho in a couple of weeks due to irregular heartbeat--   Admission Diagnosis  Hypocalcemia [E83.51] Atrial fibrillation ([place]) [I48.91] New onset atrial fibrillation (HCC) [I48.91] V-tach ([place]) [I47.2]   Discharge Diagnosis  Hypocalcemia [E83.51] Atrial fibrillation ([place]) [I48.91] New onset atrial fibrillation (HCC) [I48.91] V-tach ([place]) [I47.2]    Principal Problem:   Syncope and  collapse-suspect arrhythmia related in the setting of severe electrolyte abnormalities Active Problems:   Severe Hypophosphatemia   Non-Sustained V-tach in the setting of Severe Electrolyte Derangement   Cocaine abuse -on-going   Alcohol abuse, continuous   Diabetes mellitus type 2 with complications ([place])   Tobacco abuse   Hypothyroidism   Diabetes type 2, uncontrolled (HCC)   S/P AKA (above knee amputation) bilateral (HCC)   Hyponatremia   Essential hypertension, benign   Atrial fibrillation ([place])   Hypocalcemia      Past Medical History:  Diagnosis Date   AKI (acute kidney injury) ([place])    Constipated    Diabetes mellitus without complication (HCC)    Diarrhea    Elevated LFTs    Goiter    Gout    Hyperlipidemia    Hypertension    Leukocytosis    Reactive thrombocytosis    Right BKA infection ([place]) 08/2016   Right leg pain    Sepsis due to undetermined organism ([place])    Thyroid disease    Wound infection after surgery 08/2016    Past Surgical History:  Procedure Laterality Date   ABDOMINAL AORTOGRAM N/A 08/11/2016   Procedure: Abdominal Aortogram;  Surgeon: Waynetta Sandy, MD;  Location: [facility];  Service: Cardiovascular;  Laterality: N/A;   ABDOMINAL AORTOGRAM W/LOWER EXTREMITY N/A 08/15/2016   Procedure: Abdominal Aortogram  w/Lower Extremity;  Surgeon: Elam Dutch, MD;  Location: [facility];  Service: Cardiovascular;  Laterality: N/A;   AMPUTATION Right 08/17/2016   Procedure: RIGHT BELOW KNEE AMPUTATION;  Surgeon: Elam Dutch, MD;  Location: [facility];  Service: Vascular;  Laterality: Right;   AMPUTATION Right 09/12/2016   Procedure: AMPUTATION ABOVE KNEE;  Surgeon: Newt Minion, MD;  Location: [place] [facility];  Service: Orthopedics;  Laterality: Right;   AMPUTATION Left 08/12/2016   Procedure: LEFT BELOW KNEE AMPUTATION;  Surgeon: Newt Minion, MD;  Location: [facility];  Service: Orthopedics;  Laterality: Left;   AMPUTATION Left 11/01/2017    Procedure: LEFT ABOVE KNEE AMPUTATION;  Surgeon: Newt Minion, MD;  Location: [facility];  Service: Orthopedics;  Laterality: Left;   APPLICATION OF WOUND VAC Right 09/12/2016   Procedure: APPLICATION OF WOUND VAC ABOVE KNEE;  Surgeon: Newt Minion, MD;  Location: [place];  Service: Orthopedics;  Laterality: Right;   LOWER EXTREMITY ANGIOGRAPHY Bilateral 08/11/2016   Procedure: Lower Extremity Angiography;  Surgeon: Waynetta Sandy, MD;  Location: [facility];  Service: Cardiovascular;  Laterality: Bilateral;   PERIPHERAL VASCULAR BALLOON ANGIOPLASTY Left 08/11/2016   Procedure: Peripheral Vascular Balloon Angioplasty;  Surgeon: Waynetta Sandy, MD;  Location: [facility];  Service: Cardiovascular;  Laterality: Left;  SFA   THYROID SURGERY       HPI  from the history and physical done on the day of admission:   Chief Complaint: synocpe   HPI: Todd [name] is a 65 y.o. male with a history of poorly controlled diabetes with stage III chronic kidney disease, hypertension, history of BKA, hypothyroidism.  Patient brought to the hospital for witnessed syncopal episode at home by family members.  He was sitting in his wheelchair when the episode happened.  He denies preceding symptoms.  It is unclear how long the patient had lost consciousness.  He currently states that he feels well and does not have any chest pain, palpitations, shortness of breath.  He has never syncopized before and has no history of irregular heartbeat.  He was recently hospitalized both here and at [facility] for weakness and possible delirium.  His hospitalization here, it was discovered that he was not taking his Synthroid and his TSH was elevated.  He was restarted on his thyroid medication, which he states that he has been taking.   Emergency Department Course: Patient found to be in atrial fibrillation.  Creatinine 1.27, which appears to be his baseline.  Calcium 6.0, corrected to 7.4.       Hospital Course:    Brief Summary:- 65 year old with history of polysubstance abuse including alcohol tobacco and  almost daily cocaine use who is notoriously noncompliant with medications admitted on 12/26/2020 with concerns for syncope in the setting of severely abnormal thyroid and severe electrolyte derangement   A/p 1)Syncope-episode of nonsustained V. tach  -most likely related to severe electrolyte derangement including hypophosphatemia, hypocalcemia and severely abnormal thyroid Magnesium was 1.1, replaced and repeat magnesium is 2.8 -Phosphorous was placed on 1, after replacement phosphorus is now 4.0 -Potassium was 3.3 after replacement potassium is now 3.9 -Calcium is actually on the lower end of normal when corrected for his low albumin -Patient has ruled out for ACS by cardiac enzymes and EKG -echocardiogram without significant aortic stenosis or other outflow obstruction, EF is 55 to 60%, and also without  segmental/Regional wall motion abnormalities. - 2) Severe hypothyroidism--patient is notoriously noncompliant, --TSH is  up to 189.8 -Free T4 is low at 0.5 Patient was treated with levothyroxine 100 mcg IV, will discharge on levothyroxine 150 mcg daily, -Outpatient follow-up with endocrinologist Dr. Loni Beckwith in about 3 weeks for repeat TSH and free T4 advised   3) social/ethics--patient technically lives alone in an apartment, continues to use cocaine and alcohol and tobacco on a daily basis -Recently admitted to [facility] in June 2022, admitted to [facility] in July 2022 -Notoriously noncompliant -High risk for decompensation and death -Plan of care discussed with patient's sisters  Todd [name] and Todd [name], physiological at bedside -Patient is a full code   4) severe hypophosphatemia and  hypocalcemia and hypokalemia----severe electrolyte derangement and severely abnormal thyroid probably contributing to patient's arrhythmias including V. tach and syncope -- -Magnesium was  1.1, replaced and repeat magnesium is 2.8 -Phosphorous was placed on 1, after replacement phosphorus is now 4.0 -Potassium was 3.3 after replacement potassium is now 3.9 -Calcium is actually on the lower end of normal when corrected for his low albumin -Repeat Chem-8/BMP test in a few days advised   5) nonsustained V. tach --due to severe electrolyte abnormalities and severely abnormal thyroid -----please see #1 and #4 above   6) chronic atrial fibrillation--- Eliquis for anticoagulation and metoprolol for rate control   7)DM2-A1c 6.0 reflecting excellent diabetic control PTA, but patient sugars are probably not elevated also because patient really does not eat much he drinks a lot of alcohol and just does not eat much ---Use Novolog/Humalog Sliding scale insulin with Accu-Cheks/Fingersticks as ordered   8) recurrent AKI's--- due to dehydration, -Creatinine is currently 1.2 with a BUN of 13 - review of lab data does Not suggest frank CKD per se -renally adjust medications, avoid nephrotoxic agents / dehydration  / hypotension   9)Polysubstance abuse + Etoh Abuse--patient uses alcohol, tobacco and cocaine almost on a daily basis -lorazepam per CIWA protocol for DT prophylaxis, thiamine and folic acid as ordered --Patient on 12/28/20 initially stated that he was agreeable to going to inpatient rehab for drugs and alcohol -Please see documentation from social worker Iona Beard dated 12/28/20 -On 12/29/2020 patient change his mind again and stated he will go to rehab he wants to go home -Updated patient's 3 sisters about patient's decision to go home rather than rehab -Pt and his sisters aware that patient can still  go to outpatient rehab after discharge if he wants to -Social worker was able to make arrangements for patient to hopefully go to [facility] as outpatient and possibly get admitted to [facility] [place] for inpatient rehab -Please see social worker documentation dated 12/29/2020  from [place]   10) generalized weakness and deconditioning--- most likely related to severe electrolyte abnormalities including severe hypophosphatemia, hypocalcemia and hypokalemia as well as severely abnormal thyroid -Replace electrolytes as above in #4 -Home health physical therapy requested   11)Anorexia/FTT--- multifactorial, resulting in severe electrolyte abnormalities, -Megace for appetite stimulation as ordered -Okay to take multivitamin and nutritional supplements   Disposition/Need for in-Hospital Stay- patient unable to be discharged at this time due to --severe electrolyte abnormalities and severely abnormal thyroid resulting in arrhythmias including A. Fib/Vtach-requiring IV replacements and monitoring*   Status is: Inpatient   Remains inpatient appropriate because: Please see disposition above   Disposition: The patient is from: Home              Anticipated d/c is to: Home with [facility]    Code Status : -  Code Status:  Full Code    Family Communication:    (patient is alert, awake and coherent) -Plan of care discussed with patient's sisters  Todd [name] and [name] and [name] Consults  :  na  Discharge Condition: Medically stable  Follow UP   Follow-up Information     Services, [facility] Follow up.   Why: If you are interested in outpatient substance use treatment, please obtain an assessment at [facility]. They offer walk in appointments from 8:00-2:00 Monday-Friday. Contact information: [place] [address] [phone]         Cassandria Anger, MD. Schedule an appointment as soon as possible for a visit in 3 week(s).   Specialty: Endocrinology Why: Thyroid Recheck Contact information: [place] [address] [phone]         Erma Heritage, PA-C. Schedule an appointment as soon as possible for a visit in 2 week(s).   Specialties: Librarian, academic, Cardiology Why: cardiac arrythmias Contact  information: [address] [place] [address] [phone]                   Diet and Activity recommendation:  As advised  Discharge Instructions   Discharge Instructions     Call MD for:  difficulty breathing, headache or visual disturbances   Complete by: As directed    Call MD for:  persistant dizziness or light-headedness   Complete by: As directed    Call MD for:  persistant nausea and vomiting   Complete by: As directed    Call MD for:  severe uncontrolled pain   Complete by: As directed    Call MD for:  temperature >100.4   Complete by: As directed    Diet - low sodium heart healthy   Complete by: As directed    Diet Carb Modified   Complete by: As directed    Discharge instructions   Complete by: As directed    1)Avoid ibuprofen/Advil/Aleve/Motrin/Goody Powders/Naproxen/BC powders/Meloxicam/Diclofenac/Indomethacin and other Nonsteroidal anti-inflammatory medications as these will make you more likely to bleed and can cause stomach ulcers, can also cause Kidney problems.   2)You are taking Eliquis/Apixaban which is your blood thinner so please Avoid the medications listed above in #1 actually make you more likely to bleed  3)Complete Abstinence from Alcohol, Cocaine and other illicit drugs strongly advised  4)Please take your medications as prescribed  5)Drug and alcohol rehab program which can be done as an outpatient or inpatient is strongly recommended as discussed  6)Repeat renal panel blood tests/Chem 8 blood test in about 3 to 6 days advised  7)There has been several changes to medications--please take your medications exactly as prescribed to avoid complications  8)follow up with  Endocrinologist Dr. Loni Beckwith, MD, Endocrinology, Diabetes & Metabolism-- [facility], [address] [place], [place] [address], Phone Number- [phone] for your Thyroid Problems--You Need repeat TSH and Free T4 Test n 3 weeks  9)follow up cardiology services  Ms. Bernerd Pho in a couple of weeks due to irregular heartbeat--   Discharge wound care:   Complete by: As directed    As advised--   Increase activity slowly   Complete by: As directed          Discharge Medications     Allergies as of 12/29/2020       Reactions   Lisinopril Other (See Comments)   Hyperkalemia / Renal failure        Medication List     STOP  taking these medications    aspirin EC 81 MG tablet       TAKE these medications    allopurinol 100 MG tablet Commonly known as: ZYLOPRIM Take 1 tablet (100 mg total) by mouth daily.   apixaban 5 MG Tabs tablet Commonly known as: ELIQUIS Take 1 tablet (5 mg total) by mouth 2 (two) times daily.   atorvastatin 40 MG tablet Commonly known as: LIPITOR TAKE 1 TABLET BY MOUTH ONCE DAILY AT 6 PM   blood glucose meter kit and supplies Dispense based on patient and insurance preference. Use up to four times daily as directed. (FOR ICD-10 E10.9, E11.9).   calcium carbonate 500 MG chewable tablet Commonly known as: TUMS - dosed in mg elemental calcium Chew 1 tablet (200 mg of elemental calcium total) by mouth 2 (two) times daily with a meal.   cetirizine 10 MG tablet Commonly known as: ZYRTEC Take 1 tablet (10 mg total) by mouth daily as needed for allergies. For allergy symptoms   feeding supplement Liqd Take 237 mLs by mouth 2 (two) times daily between meals.   folic acid 1 MG tablet Commonly known as: FOLVITE Take 1 tablet (1 mg total) by mouth daily.   glucose blood test strip Commonly known as: Accu-Chek Aviva Plus Check blood sugars four times a day   levothyroxine 150 MCG tablet Commonly known as: Synthroid Take 1 tablet (150 mcg total) by mouth daily. What changed:  medication strength how much to take when to take this   megestrol 400 MG/10ML suspension Commonly known as: MEGACE Take 10 mLs (400 mg total) by mouth 2 (two) times daily. For appetite stimulation   metoprolol  succinate 25 MG 24 hr tablet Commonly known as: Toprol XL Take 1 tablet (25 mg total) by mouth daily. What changed:  medication strength how much to take   phosphorus 155-852-130 MG tablet Commonly known as: K PHOS NEUTRAL Take 1 tablet (250 mg total) by mouth 2 (two) times daily.   sertraline 50 MG tablet Commonly known as: ZOLOFT Take 1 tablet (50 mg total) by mouth at bedtime.   sodium bicarbonate 650 MG tablet Take 1 tablet (650 mg total) by mouth 3 (three) times daily.   thiamine 100 MG tablet Take 1 tablet (100 mg total) by mouth daily.               Discharge Care Instructions  (From admission, onward)           Start     Ordered   12/29/20 0000  Discharge wound care:       Comments: As advised--   12/29/20 1149            Major procedures and Radiology Reports - PLEASE review detailed and final reports for all details, in brief -   DG Chest Portable 1 View  Result Date: 12/26/2020 CLINICAL DATA:  Weakness and syncopal episode yesterday. EXAM: PORTABLE CHEST 1 VIEW COMPARISON:  November 21, 2020 FINDINGS: Tortuosity and calcific atherosclerotic disease of the aorta. Postsurgical changes at the thoracic inlet. Cardiomediastinal silhouette is normal. Mediastinal contours appear intact. There is no evidence of pleural effusion or pneumothorax. Streaky airspace opacities in the right lung base. Osseous structures are without acute abnormality. Soft tissues are grossly normal. IMPRESSION: Streaky airspace opacities in the right lung base, which may represent atelectasis or airspace consolidation. Electronically Signed   By: Fidela Salisbury M.D.   On: 12/26/2020 16:13   DG Chest Portable 1 View  Result Date: 12/04/2020 CLINICAL DATA:  Infection, near syncope with generalized weakness. EXAM: PORTABLE CHEST 1 VIEW COMPARISON:  February 04, 2018. FINDINGS: EKG leads projecting over the chest. Cardiomediastinal contours and hilar structures are stable. Calcified  atheromatous plaque of the thoracic aorta. Signs of thyroidectomy at the thoracic inlet. Lungs are clear. No signs of pneumothorax.  No effusion. On limited assessment no acute skeletal process. IMPRESSION: No acute cardiopulmonary disease. Electronically Signed   By: Zetta Bills M.D.   On: 12/04/2020 21:18   ECHOCARDIOGRAM COMPLETE  Result Date: 12/27/2020    ECHOCARDIOGRAM REPORT   Patient Name:   Todd [name] Date of Exam: 12/27/2020 Medical Rec #:  [id number]       Height:       68.0 in Accession #:    [id number]      Weight:       128.3 lb Date of Birth:  May 04, 1956      BSA:          1.692 m Patient Age:    46 years        BP:           108/64 mmHg Patient Gender: M               HR:           71 bpm. Exam Location:  [facility] Procedure: 2D Echo, Cardiac Doppler, Color Doppler and Intracardiac            Opacification Agent Indications:    I48.0 Paroxysmal atrial fibrillation  History:        Patient has prior history of Echocardiogram examinations, most                 recent 02/13/2018. Risk Factors:Hypertension, Diabetes and                 Dyslipidemia. Thyroid Disease. Sepsis.  Sonographer:    Jonelle Sidle Dance Referring Phys: [id number] JACOB J STINSON  Sonographer Comments: Technically difficult study due to poor echo windows. Image acquisition challenging due to respiratory motion. IMPRESSIONS  1. Left ventricular ejection fraction, by estimation, is 55 to 60%. The left ventricle has normal function. The left ventricle has no regional wall motion abnormalities. There is mild concentric left ventricular hypertrophy. Left ventricular diastolic parameters are consistent with Grade I diastolic dysfunction (impaired relaxation).  2. Right ventricular systolic function is normal. The right ventricular size is normal. Tricuspid regurgitation signal is inadequate for assessing PA pressure.  3. The mitral valve is grossly normal. No evidence of mitral valve regurgitation. No evidence of mitral stenosis.  4. The  aortic valve is tricuspid. Aortic valve regurgitation is not visualized. No aortic stenosis is present.  5. The inferior vena cava is normal in size with greater than 50% respiratory variability, suggesting right atrial pressure of 3 mmHg. Comparison(s): No significant change from prior study. FINDINGS  Left Ventricle: Left ventricular ejection fraction, by estimation, is 55 to 60%. The left ventricle has normal function. The left ventricle has no regional wall motion abnormalities. Definity contrast agent was given IV to delineate the left ventricular  endocardial borders. The left ventricular internal cavity size was normal in size. There is mild concentric left ventricular hypertrophy. Left ventricular diastolic parameters are consistent with Grade I diastolic dysfunction (impaired relaxation). Right Ventricle: The right ventricular size is normal. No increase in right ventricular wall thickness. Right ventricular systolic function is normal. Tricuspid regurgitation signal is inadequate for assessing PA pressure. Left Atrium:  Left atrial size was normal in size. Right Atrium: Right atrial size was normal in size. Pericardium: Trivial pericardial effusion is present. Presence of pericardial fat pad. Mitral Valve: The mitral valve is grossly normal. No evidence of mitral valve regurgitation. No evidence of mitral valve stenosis. Tricuspid Valve: The tricuspid valve is grossly normal. Tricuspid valve regurgitation is trivial. No evidence of tricuspid stenosis. Aortic Valve: The aortic valve is tricuspid. Aortic valve regurgitation is not visualized. No aortic stenosis is present. Pulmonic Valve: The pulmonic valve was not well visualized. Pulmonic valve regurgitation is not visualized. Aorta: The aortic root and ascending aorta are structurally normal, with no evidence of dilitation. Venous: The inferior vena cava is normal in size with greater than 50% respiratory variability, suggesting right atrial pressure of 3  mmHg. IAS/Shunts: The atrial septum is grossly normal.  LEFT VENTRICLE PLAX 2D LVIDd:         4.02 cm  Diastology LVIDs:         2.98 cm  LV e' medial:   4.64 cm/s LV PW:         1.40 cm  LV E/e' medial: 8.4 LV IVS:        1.31 cm LVOT diam:     2.00 cm LV SV:         30 LV SV Index:   17 LVOT Area:     3.14 cm  RIGHT VENTRICLE RV Basal diam:  2.52 cm TAPSE (M-mode): 1.9 cm LEFT ATRIUM             Index       RIGHT ATRIUM           Index LA diam:        3.10 cm 1.83 cm/m  RA Area:     14.80 cm LA Vol (A2C):   59.7 ml 35.28 ml/m RA Volume:   36.70 ml  21.69 ml/m LA Vol (A4C):   37.6 ml 22.22 ml/m LA Biplane Vol: 47.3 ml 27.95 ml/m  AORTIC VALVE LVOT Vmax:   48.80 cm/s LVOT Vmean:  33.600 cm/s LVOT VTI:    0.094 m  AORTA Ao Root diam: 3.30 cm Ao Asc diam:  3.20 cm MITRAL VALVE MV Area (PHT): 2.48 cm    SHUNTS MV Decel Time: 306 msec    Systemic VTI:  0.09 m MV E velocity: 38.80 cm/s  Systemic Diam: 2.00 cm MV A velocity: 42.70 cm/s MV E/A ratio:  0.91 Eleonore Chiquito MD Electronically signed by Eleonore Chiquito MD Signature Date/Time: 12/27/2020/3:39:42 PM    Final     Micro Results  Recent Results (from the past 240 hour(s))  SARS CORONAVIRUS 2 (TAT 6-24 HRS) Nasopharyngeal Nasopharyngeal Swab     Status: None   Collection Time: 12/26/20  6:53 PM   Specimen: Nasopharyngeal Swab  Result Value Ref Range Status   SARS Coronavirus 2 NEGATIVE NEGATIVE Final    Comment: (NOTE) SARS-CoV-2 target nucleic acids are NOT DETECTED.  The SARS-CoV-2 RNA is generally detectable in upper and lower respiratory specimens during the acute phase of infection. Negative results do not preclude SARS-CoV-2 infection, do not rule out co-infections with other pathogens, and should not be used as the sole basis for treatment or other patient management decisions. Negative results must be combined with clinical observations, patient history, and epidemiological information. The expected result is Negative.  Fact Sheet  for Patients: SugarRoll.be  Fact Sheet for Healthcare Providers: https://www.woods-mathews.com/  This test is not yet approved or cleared by  the [employer] and  has been authorized for detection and/or diagnosis of SARS-CoV-2 by FDA under an Emergency Use Authorization (EUA). This EUA will remain  in effect (meaning this test can be used) for the duration of the COVID-19 declaration under Se ction 564(b)(1) of the Act, 21 U.S.C. section 360bbb-3(b)(1), unless the authorization is terminated or revoked sooner.  Performed at [facility], [place] [address], [place], [place] [address]        Today   Subjective    Todd [name] today has no new complaints        -Patient is requesting discharge home -denies chest pains palpitations dizziness or shortness of breath No fever  Or chills   No Nausea, Vomiting or Diarrhea   Patient has been seen and examined prior to discharge   Objective   Blood pressure 100/62, pulse 70, temperature 98.1 F (36.7 C), temperature source Oral, resp. rate 16, height 5' 8"  (1.727 m), weight 58.2 kg, SpO2 91 %.   Intake/Output Summary (Last 24 hours) at 12/29/2020 1200 Last data filed at 12/29/2020 0900 Gross per 24 hour  Intake 1296.7 ml  Output 550 ml  Net 746.7 ml    Exam Gen:- Awake Alert,  in no apparent distress HEENT:- [place].AT, No sclera icterus Neck-Supple Neck,No JVD,. Lungs-  CTAB , fair symmetrical air movement CV- S1, S2 normal, irregularly irregular abd-  +ve B.Sounds, Abd Soft, No tenderness,    Psych-affect is flat, oriented x3 Neuro-generalized weakness, no new focal deficits, no significant tremors MSK-bilateral AKA   Data Review   CBC w Diff:  Lab Results  Component Value Date   WBC 8.3 12/28/2020   HGB 8.3 (L) 12/28/2020   HGB 13.5 01/27/2020   HCT 25.9 (L) 12/28/2020   HCT 39.7 01/27/2020   PLT 382 12/28/2020   PLT 342 01/27/2020   LYMPHOPCT 30 12/26/2020    MONOPCT 7 12/26/2020   EOSPCT 0 12/26/2020   BASOPCT 0 12/26/2020    CMP:  Lab Results  Component Value Date   NA 135 12/29/2020   NA 136 09/22/2020   K 3.9 12/29/2020   CL 108 12/29/2020   CO2 18 (L) 12/29/2020   BUN 13 12/29/2020   BUN 13 09/22/2020   CREATININE 1.25 (H) 12/29/2020   GLU 117 09/21/2016   PROT 5.6 (L) 12/27/2020   PROT 8.1 09/22/2020   ALBUMIN 1.7 (L) 12/29/2020   ALBUMIN 3.1 (L) 09/22/2020   BILITOT 0.6 12/27/2020   BILITOT 0.3 09/22/2020   ALKPHOS 69 12/27/2020   AST 17 12/27/2020   ALT 8 12/27/2020  .   Total Discharge time is about 33 minutes  Roxan Hockey M.D on 12/29/2020 at 12:00 PM  Go to [email or web address] -  for contact info  [facility] - Office  [phone]

## 2020-12-29 NOTE — Plan of Care (Signed)
  Problem: Education: Goal: Knowledge of General Education information will improve Description Including pain rating scale, medication(s)/side effects and non-pharmacologic comfort measures Outcome: Progressing   Problem: Health Behavior/Discharge Planning: Goal: Ability to manage health-related needs will improve Outcome: Progressing   

## 2020-12-29 NOTE — TOC Transition Note (Addendum)
Transition of Care [facility]) - CM/SW Discharge Note   Patient Details  Name: Todd [name] MRN: [id number] Date of Birth: 01/19/1956  [facility] CM/SW Contact:  Shade Flood, LCSW Phone Number: 12/29/2020, 11:24 AM   Clinical Narrative:     Pt stable for dc per MD. Pt reporting that he is not interested in residential AODA treatment. TOC had been unable to find an option for pt due to his insurance and his need for w/c. Pt has resource list for outpatient services and he has been encouraged to follow up. [facility] in [place] added to pt's AVS follow up for pt's reference on walk in assessment of this treatment needs.   There are no other TOC needs for dc.    1428: Spoke with pt's sisters on the phone about treatment resources and options for pt. Then spoke with pt's sisters and pt on the phone all together and discussed referral for inpatient treatment at [facility] in [place]. Pt agreed for this LCSW to make a referral. There are no beds available currently and pt is aware. Pt will return home and follow up with outpatient at [facility]. Ander Slade will follow up with pt by phone to further assess and offer treatment if they can meet pt's needs and pt is agreeable.   Referral faxed to [facility].  Final next level of care: Home/Self Care Barriers to Discharge: Barriers Resolved   Patient Goals and CMS Choice Patient states their goals for this hospitalization and ongoing recovery are:: Inpatient rehab if accepted CMS Medicare.gov Compare Post Acute Care list provided to:: Patient Choice offered to / list presented to : Patient  Discharge Placement                       Discharge Plan and Services In-house Referral: Clinical Social Work Discharge Planning Services: CM Consult                                 Social Determinants of Health (SDOH) Interventions     Readmission Risk Interventions No flowsheet data  found.

## 2020-12-30 NOTE — TOC Progression Note (Signed)
Pt discharged home yesterday. TOC continued to work to find [facility] agency to accept pt's care. Unable to find an agency that will accept him due to his psychosocial concerns documented in the record.

## 2021-01-01 ENCOUNTER — Telehealth: Payer: Medicare HMO

## 2021-01-04 ENCOUNTER — Telehealth: Payer: Self-pay | Admitting: *Deleted

## 2021-01-04 NOTE — Telephone Encounter (Signed)
Patient has been discharged from practice and now has no PCP and needs FL2 filled out for placement. Sister is asking if a provider here can do it.

## 2021-01-05 DIAGNOSIS — R6889 Other general symptoms and signs: Secondary | ICD-10-CM | POA: Diagnosis not present

## 2021-01-05 DIAGNOSIS — L8991 Pressure ulcer of unspecified site, stage 1: Secondary | ICD-10-CM | POA: Diagnosis not present

## 2021-01-05 DIAGNOSIS — Z7189 Other specified counseling: Secondary | ICD-10-CM | POA: Diagnosis not present

## 2021-01-05 NOTE — Telephone Encounter (Signed)
TC to [facility], Dr. Livia Snellen will see pt to do the [facility], one of the sisters will have to bring him to the appt, I offered next Tues 8/2. She will check with [name] to see what her schedule is and call me back.

## 2021-01-08 NOTE — Telephone Encounter (Signed)
Called sister back, she said they no longer needed the [facility]

## 2021-01-10 ENCOUNTER — Inpatient Hospital Stay (HOSPITAL_COMMUNITY): Payer: Medicare HMO

## 2021-01-10 ENCOUNTER — Encounter (HOSPITAL_COMMUNITY): Payer: Self-pay | Admitting: Emergency Medicine

## 2021-01-10 ENCOUNTER — Other Ambulatory Visit: Payer: Self-pay

## 2021-01-10 ENCOUNTER — Inpatient Hospital Stay (HOSPITAL_COMMUNITY)
Admission: EM | Admit: 2021-01-10 | Discharge: 2021-01-15 | DRG: 308 | Disposition: A | Discharge status: Skilled Nursing Facility | Payer: Medicare HMO | Attending: Family Medicine | Admitting: Family Medicine

## 2021-01-10 DIAGNOSIS — R5381 Other malaise: Secondary | ICD-10-CM | POA: Diagnosis not present

## 2021-01-10 DIAGNOSIS — F1210 Cannabis abuse, uncomplicated: Secondary | ICD-10-CM | POA: Diagnosis present

## 2021-01-10 DIAGNOSIS — F1410 Cocaine abuse, uncomplicated: Secondary | ICD-10-CM | POA: Diagnosis not present

## 2021-01-10 DIAGNOSIS — Z79899 Other long term (current) drug therapy: Secondary | ICD-10-CM

## 2021-01-10 DIAGNOSIS — Z8673 Personal history of transient ischemic attack (TIA), and cerebral infarction without residual deficits: Secondary | ICD-10-CM

## 2021-01-10 DIAGNOSIS — K2950 Unspecified chronic gastritis without bleeding: Secondary | ICD-10-CM | POA: Diagnosis not present

## 2021-01-10 DIAGNOSIS — K921 Melena: Secondary | ICD-10-CM

## 2021-01-10 DIAGNOSIS — Z89611 Acquired absence of right leg above knee: Secondary | ICD-10-CM

## 2021-01-10 DIAGNOSIS — F0390 Unspecified dementia without behavioral disturbance: Secondary | ICD-10-CM | POA: Diagnosis not present

## 2021-01-10 DIAGNOSIS — I471 Supraventricular tachycardia: Secondary | ICD-10-CM | POA: Diagnosis present

## 2021-01-10 DIAGNOSIS — Z9114 Patient's other noncompliance with medication regimen: Secondary | ICD-10-CM | POA: Diagnosis not present

## 2021-01-10 DIAGNOSIS — Z8249 Family history of ischemic heart disease and other diseases of the circulatory system: Secondary | ICD-10-CM

## 2021-01-10 DIAGNOSIS — I4891 Unspecified atrial fibrillation: Secondary | ICD-10-CM | POA: Diagnosis not present

## 2021-01-10 DIAGNOSIS — R627 Adult failure to thrive: Secondary | ICD-10-CM | POA: Diagnosis present

## 2021-01-10 DIAGNOSIS — D638 Anemia in other chronic diseases classified elsewhere: Secondary | ICD-10-CM | POA: Diagnosis present

## 2021-01-10 DIAGNOSIS — K2210 Ulcer of esophagus without bleeding: Secondary | ICD-10-CM | POA: Diagnosis not present

## 2021-01-10 DIAGNOSIS — E039 Hypothyroidism, unspecified: Secondary | ICD-10-CM | POA: Diagnosis not present

## 2021-01-10 DIAGNOSIS — K2990 Gastroduodenitis, unspecified, without bleeding: Secondary | ICD-10-CM | POA: Diagnosis not present

## 2021-01-10 DIAGNOSIS — Z681 Body mass index (BMI) 19 or less, adult: Secondary | ICD-10-CM | POA: Diagnosis not present

## 2021-01-10 DIAGNOSIS — B3781 Candidal esophagitis: Secondary | ICD-10-CM | POA: Diagnosis not present

## 2021-01-10 DIAGNOSIS — M109 Gout, unspecified: Secondary | ICD-10-CM | POA: Diagnosis present

## 2021-01-10 DIAGNOSIS — Z888 Allergy status to other drugs, medicaments and biological substances status: Secondary | ICD-10-CM

## 2021-01-10 DIAGNOSIS — E118 Type 2 diabetes mellitus with unspecified complications: Secondary | ICD-10-CM | POA: Diagnosis present

## 2021-01-10 DIAGNOSIS — K2289 Other specified disease of esophagus: Secondary | ICD-10-CM | POA: Diagnosis not present

## 2021-01-10 DIAGNOSIS — R531 Weakness: Secondary | ICD-10-CM | POA: Diagnosis not present

## 2021-01-10 DIAGNOSIS — E1165 Type 2 diabetes mellitus with hyperglycemia: Secondary | ICD-10-CM | POA: Diagnosis not present

## 2021-01-10 DIAGNOSIS — F1010 Alcohol abuse, uncomplicated: Secondary | ICD-10-CM | POA: Diagnosis not present

## 2021-01-10 DIAGNOSIS — K6389 Other specified diseases of intestine: Secondary | ICD-10-CM | POA: Diagnosis not present

## 2021-01-10 DIAGNOSIS — R059 Cough, unspecified: Secondary | ICD-10-CM | POA: Diagnosis not present

## 2021-01-10 DIAGNOSIS — K2971 Gastritis, unspecified, with bleeding: Secondary | ICD-10-CM | POA: Diagnosis not present

## 2021-01-10 DIAGNOSIS — N390 Urinary tract infection, site not specified: Secondary | ICD-10-CM | POA: Diagnosis present

## 2021-01-10 DIAGNOSIS — Z7189 Other specified counseling: Secondary | ICD-10-CM | POA: Diagnosis not present

## 2021-01-10 DIAGNOSIS — Z720 Tobacco use: Secondary | ICD-10-CM | POA: Diagnosis not present

## 2021-01-10 DIAGNOSIS — Z89612 Acquired absence of left leg above knee: Secondary | ICD-10-CM

## 2021-01-10 DIAGNOSIS — R4182 Altered mental status, unspecified: Secondary | ICD-10-CM | POA: Diagnosis not present

## 2021-01-10 DIAGNOSIS — R918 Other nonspecific abnormal finding of lung field: Secondary | ICD-10-CM | POA: Diagnosis present

## 2021-01-10 DIAGNOSIS — E46 Unspecified protein-calorie malnutrition: Secondary | ICD-10-CM | POA: Diagnosis present

## 2021-01-10 DIAGNOSIS — I4819 Other persistent atrial fibrillation: Secondary | ICD-10-CM | POA: Diagnosis not present

## 2021-01-10 DIAGNOSIS — K635 Polyp of colon: Secondary | ICD-10-CM | POA: Diagnosis present

## 2021-01-10 DIAGNOSIS — E876 Hypokalemia: Secondary | ICD-10-CM | POA: Diagnosis present

## 2021-01-10 DIAGNOSIS — R54 Age-related physical debility: Secondary | ICD-10-CM | POA: Diagnosis present

## 2021-01-10 DIAGNOSIS — Z833 Family history of diabetes mellitus: Secondary | ICD-10-CM

## 2021-01-10 DIAGNOSIS — K5730 Diverticulosis of large intestine without perforation or abscess without bleeding: Secondary | ICD-10-CM | POA: Diagnosis not present

## 2021-01-10 DIAGNOSIS — Z794 Long term (current) use of insulin: Secondary | ICD-10-CM | POA: Diagnosis present

## 2021-01-10 DIAGNOSIS — Z20822 Contact with and (suspected) exposure to covid-19: Secondary | ICD-10-CM | POA: Diagnosis not present

## 2021-01-10 DIAGNOSIS — Z7901 Long term (current) use of anticoagulants: Secondary | ICD-10-CM

## 2021-01-10 DIAGNOSIS — Z9119 Patient's noncompliance with other medical treatment and regimen: Secondary | ICD-10-CM

## 2021-01-10 DIAGNOSIS — K229 Disease of esophagus, unspecified: Secondary | ICD-10-CM | POA: Diagnosis not present

## 2021-01-10 DIAGNOSIS — I10 Essential (primary) hypertension: Secondary | ICD-10-CM | POA: Diagnosis present

## 2021-01-10 DIAGNOSIS — Z79890 Hormone replacement therapy (postmenopausal): Secondary | ICD-10-CM

## 2021-01-10 DIAGNOSIS — K2981 Duodenitis with bleeding: Secondary | ICD-10-CM | POA: Diagnosis not present

## 2021-01-10 DIAGNOSIS — Z1612 Extended spectrum beta lactamase (ESBL) resistance: Secondary | ICD-10-CM | POA: Diagnosis present

## 2021-01-10 DIAGNOSIS — I472 Ventricular tachycardia: Secondary | ICD-10-CM | POA: Diagnosis not present

## 2021-01-10 DIAGNOSIS — D125 Benign neoplasm of sigmoid colon: Secondary | ICD-10-CM | POA: Diagnosis not present

## 2021-01-10 DIAGNOSIS — K6289 Other specified diseases of anus and rectum: Secondary | ICD-10-CM | POA: Diagnosis not present

## 2021-01-10 DIAGNOSIS — E785 Hyperlipidemia, unspecified: Secondary | ICD-10-CM | POA: Diagnosis present

## 2021-01-10 DIAGNOSIS — D123 Benign neoplasm of transverse colon: Secondary | ICD-10-CM | POA: Diagnosis not present

## 2021-01-10 DIAGNOSIS — Z515 Encounter for palliative care: Secondary | ICD-10-CM | POA: Diagnosis not present

## 2021-01-10 DIAGNOSIS — D500 Iron deficiency anemia secondary to blood loss (chronic): Secondary | ICD-10-CM | POA: Diagnosis present

## 2021-01-10 DIAGNOSIS — F17200 Nicotine dependence, unspecified, uncomplicated: Secondary | ICD-10-CM | POA: Diagnosis not present

## 2021-01-10 DIAGNOSIS — K209 Esophagitis, unspecified without bleeding: Secondary | ICD-10-CM | POA: Diagnosis not present

## 2021-01-10 DIAGNOSIS — F17210 Nicotine dependence, cigarettes, uncomplicated: Secondary | ICD-10-CM | POA: Diagnosis present

## 2021-01-10 DIAGNOSIS — K2970 Gastritis, unspecified, without bleeding: Secondary | ICD-10-CM | POA: Diagnosis not present

## 2021-01-10 LAB — CBC WITH DIFFERENTIAL/PLATELET
Abs Immature Granulocytes: 0.07 10*3/uL (ref 0.00–0.07)
Basophils Absolute: 0.1 10*3/uL (ref 0.0–0.1)
Basophils Relative: 1 %
Eosinophils Absolute: 0.3 10*3/uL (ref 0.0–0.5)
Eosinophils Relative: 2 %
HCT: 24.3 % — ABNORMAL LOW (ref 39.0–52.0)
Hemoglobin: 7.8 g/dL — ABNORMAL LOW (ref 13.0–17.0)
Immature Granulocytes: 1 %
Lymphocytes Relative: 21 %
Lymphs Abs: 2.8 10*3/uL (ref 0.7–4.0)
MCH: 26.5 pg (ref 26.0–34.0)
MCHC: 32.1 g/dL (ref 30.0–36.0)
MCV: 82.7 fL (ref 80.0–100.0)
Monocytes Absolute: 0.7 10*3/uL (ref 0.1–1.0)
Monocytes Relative: 5 %
Neutro Abs: 9.3 10*3/uL — ABNORMAL HIGH (ref 1.7–7.7)
Neutrophils Relative %: 70 %
Platelets: 406 10*3/uL — ABNORMAL HIGH (ref 150–400)
RBC: 2.94 MIL/uL — ABNORMAL LOW (ref 4.22–5.81)
RDW: 16.5 % — ABNORMAL HIGH (ref 11.5–15.5)
WBC: 13.2 10*3/uL — ABNORMAL HIGH (ref 4.0–10.5)
nRBC: 0 % (ref 0.0–0.2)

## 2021-01-10 LAB — COMPREHENSIVE METABOLIC PANEL
ALT: 9 U/L (ref 0–44)
AST: 15 U/L (ref 15–41)
Albumin: 1.7 g/dL — ABNORMAL LOW (ref 3.5–5.0)
Alkaline Phosphatase: 86 U/L (ref 38–126)
Anion gap: 8 (ref 5–15)
BUN: 13 mg/dL (ref 8–23)
CO2: 24 mmol/L (ref 22–32)
Calcium: 5.2 mg/dL — CL (ref 8.9–10.3)
Chloride: 109 mmol/L (ref 98–111)
Creatinine, Ser: 0.93 mg/dL (ref 0.61–1.24)
GFR, Estimated: >60 mL/min (ref >60)
Glucose, Bld: 94 mg/dL (ref 70–99)
Potassium: 2.7 mmol/L — CL (ref 3.5–5.1)
Sodium: 141 mmol/L (ref 135–145)
Total Bilirubin: 0.8 mg/dL (ref 0.3–1.2)
Total Protein: 6.3 g/dL — ABNORMAL LOW (ref 6.5–8.1)

## 2021-01-10 LAB — RESP PANEL BY RT-PCR (FLU A&B, COVID) ARPGX2
Influenza A by PCR: NEGATIVE
Influenza B by PCR: NEGATIVE
SARS Coronavirus 2 by RT PCR: NEGATIVE

## 2021-01-10 LAB — MAGNESIUM: Magnesium: 1.2 mg/dL — ABNORMAL LOW (ref 1.7–2.4)

## 2021-01-10 MED ORDER — ACETAMINOPHEN 650 MG RE SUPP: 650.0000 mg | Freq: Four times a day (QID) | RECTAL | Status: DC | PRN

## 2021-01-10 MED ORDER — CALCIUM CARBONATE ANTACID 500 MG PO CHEW
1.0000 | CHEWABLE_TABLET | Freq: Two times a day (BID) | ORAL | Status: DC
  Administered 2021-01-10 – 2021-01-11 (×3): 200 mg via ORAL
  Filled 2021-01-10 (×4): qty 1

## 2021-01-10 MED ORDER — KCL IN DEXTROSE-NACL 20-5-0.45 MEQ/L-%-% IV SOLN
INTRAVENOUS | Status: DC
  Filled 2021-01-10 (×2): qty 1000

## 2021-01-10 MED ORDER — METOPROLOL SUCCINATE ER 25 MG PO TB24
25.0000 mg | ORAL_TABLET | Freq: Every day | ORAL | Status: DC
  Administered 2021-01-10 – 2021-01-15 (×5): 25 mg via ORAL
  Filled 2021-01-10 (×5): qty 1

## 2021-01-10 MED ORDER — SODIUM CHLORIDE 0.9% FLUSH: 3.0000 mL | INTRAVENOUS | Status: DC | PRN

## 2021-01-10 MED ORDER — CALCIUM GLUCONATE-NACL 1-0.675 GM/50ML-% IV SOLN
1.0000 g | Freq: Once | INTRAVENOUS | Status: AC
  Administered 2021-01-10: 1000 mg via INTRAVENOUS
  Filled 2021-01-10: qty 50

## 2021-01-10 MED ORDER — LEVOTHYROXINE SODIUM 75 MCG PO TABS
150.0000 ug | ORAL_TABLET | Freq: Every day | ORAL | Status: DC
  Administered 2021-01-11: 150 ug via ORAL
  Filled 2021-01-10: qty 2

## 2021-01-10 MED ORDER — GERHARDT'S BUTT CREAM
TOPICAL_CREAM | Freq: Three times a day (TID) | CUTANEOUS | Status: DC
  Filled 2021-01-10 (×2): qty 1

## 2021-01-10 MED ORDER — ONDANSETRON HCL 4 MG PO TABS: 4.0000 mg | ORAL_TABLET | Freq: Four times a day (QID) | ORAL | Status: DC | PRN

## 2021-01-10 MED ORDER — PANTOPRAZOLE SODIUM 40 MG PO TBEC
40.0000 mg | DELAYED_RELEASE_TABLET | Freq: Two times a day (BID) | ORAL | Status: DC
  Administered 2021-01-10 – 2021-01-15 (×10): 40 mg via ORAL
  Filled 2021-01-10 (×10): qty 1

## 2021-01-10 MED ORDER — SODIUM CHLORIDE 0.9 % IV BOLUS
1000.0000 mL | Freq: Once | INTRAVENOUS | Status: AC
  Administered 2021-01-10: 1000 mL via INTRAVENOUS

## 2021-01-10 MED ORDER — MAGNESIUM SULFATE 4 GM/100ML IV SOLN
4.0000 g | Freq: Once | INTRAVENOUS | Status: AC
  Administered 2021-01-10: 4 g via INTRAVENOUS
  Filled 2021-01-10: qty 100

## 2021-01-10 MED ORDER — BISACODYL 10 MG RE SUPP: 10.0000 mg | Freq: Every day | RECTAL | Status: DC | PRN

## 2021-01-10 MED ORDER — SODIUM BICARBONATE 650 MG PO TABS
650.0000 mg | ORAL_TABLET | Freq: Three times a day (TID) | ORAL | Status: DC
  Administered 2021-01-10 – 2021-01-15 (×13): 650 mg via ORAL
  Filled 2021-01-10 (×13): qty 1

## 2021-01-10 MED ORDER — ATORVASTATIN CALCIUM 40 MG PO TABS
40.0000 mg | ORAL_TABLET | Freq: Every day | ORAL | Status: DC
  Administered 2021-01-10 – 2021-01-14 (×5): 40 mg via ORAL
  Filled 2021-01-10 (×7): qty 1

## 2021-01-10 MED ORDER — THIAMINE HCL 100 MG PO TABS
100.0000 mg | ORAL_TABLET | Freq: Every day | ORAL | Status: DC
Start: 2021-01-11 — End: 2021-01-15
  Administered 2021-01-11 – 2021-01-15 (×4): 100 mg via ORAL
  Filled 2021-01-10 (×4): qty 1

## 2021-01-10 MED ORDER — SODIUM CHLORIDE 0.9% FLUSH
3.0000 mL | Freq: Two times a day (BID) | INTRAVENOUS | Status: DC
  Administered 2021-01-12 – 2021-01-15 (×4): 3 mL via INTRAVENOUS

## 2021-01-10 MED ORDER — POTASSIUM CHLORIDE 10 MEQ/100ML IV SOLN
10.0000 meq | Freq: Once | INTRAVENOUS | Status: AC
  Administered 2021-01-10: 10 meq via INTRAVENOUS
  Filled 2021-01-10: qty 100

## 2021-01-10 MED ORDER — MEGESTROL ACETATE 400 MG/10ML PO SUSP
400.0000 mg | Freq: Two times a day (BID) | ORAL | Status: DC
  Administered 2021-01-10 – 2021-01-15 (×8): 400 mg via ORAL
  Filled 2021-01-10 (×9): qty 10

## 2021-01-10 MED ORDER — SODIUM CHLORIDE 0.9 % IV SOLN: 250.0000 mL | INTRAVENOUS | Status: DC | PRN

## 2021-01-10 MED ORDER — K PHOS MONO-SOD PHOS DI & MONO 155-852-130 MG PO TABS
250.0000 mg | ORAL_TABLET | Freq: Two times a day (BID) | ORAL | Status: DC
  Administered 2021-01-10 – 2021-01-12 (×5): 250 mg via ORAL
  Filled 2021-01-10 (×5): qty 1

## 2021-01-10 MED ORDER — POTASSIUM CHLORIDE CRYS ER 20 MEQ PO TBCR
40.0000 meq | EXTENDED_RELEASE_TABLET | Freq: Once | ORAL | Status: AC
  Administered 2021-01-10: 40 meq via ORAL
  Filled 2021-01-10: qty 2

## 2021-01-10 MED ORDER — METOPROLOL TARTRATE 5 MG/5ML IV SOLN
5.0000 mg | Freq: Once | INTRAVENOUS | Status: AC
  Administered 2021-01-10: 5 mg via INTRAVENOUS
  Filled 2021-01-10: qty 5

## 2021-01-10 MED ORDER — ACETAMINOPHEN 325 MG PO TABS: 650.0000 mg | ORAL_TABLET | Freq: Four times a day (QID) | ORAL | Status: DC | PRN

## 2021-01-10 MED ORDER — FOLIC ACID 1 MG PO TABS
1.0000 mg | ORAL_TABLET | Freq: Every day | ORAL | Status: DC
  Administered 2021-01-11 – 2021-01-15 (×4): 1 mg via ORAL
  Filled 2021-01-10 (×4): qty 1

## 2021-01-10 MED ORDER — TRAZODONE HCL 50 MG PO TABS
50.0000 mg | ORAL_TABLET | Freq: Every evening | ORAL | Status: DC | PRN
  Administered 2021-01-10 – 2021-01-11 (×2): 50 mg via ORAL
  Filled 2021-01-10 (×2): qty 1

## 2021-01-10 MED ORDER — ALLOPURINOL 100 MG PO TABS
100.0000 mg | ORAL_TABLET | Freq: Every day | ORAL | Status: DC
  Administered 2021-01-11 – 2021-01-15 (×4): 100 mg via ORAL
  Filled 2021-01-10 (×4): qty 1

## 2021-01-10 MED ORDER — ONDANSETRON HCL 4 MG/2ML IJ SOLN: 4.0000 mg | Freq: Four times a day (QID) | INTRAMUSCULAR | Status: DC | PRN

## 2021-01-10 MED ORDER — ENOXAPARIN SODIUM 30 MG/0.3ML IJ SOSY: 30.0000 mg | PREFILLED_SYRINGE | INTRAMUSCULAR | Status: DC

## 2021-01-10 MED ORDER — ADULT MULTIVITAMIN W/MINERALS CH
1.0000 | ORAL_TABLET | Freq: Every day | ORAL | Status: DC
  Administered 2021-01-10 – 2021-01-15 (×5): 1 via ORAL
  Filled 2021-01-10 (×6): qty 1

## 2021-01-10 MED ORDER — SERTRALINE HCL 50 MG PO TABS
50.0000 mg | ORAL_TABLET | Freq: Every day | ORAL | Status: DC
  Administered 2021-01-10 – 2021-01-14 (×5): 50 mg via ORAL
  Filled 2021-01-10 (×5): qty 1

## 2021-01-10 MED ORDER — SODIUM CHLORIDE 0.9% FLUSH: 3.0000 mL | Freq: Two times a day (BID) | INTRAVENOUS | Status: DC

## 2021-01-10 MED ORDER — APIXABAN 5 MG PO TABS
5.0000 mg | ORAL_TABLET | Freq: Two times a day (BID) | ORAL | Status: DC
  Administered 2021-01-10: 5 mg via ORAL
  Filled 2021-01-10 (×2): qty 1

## 2021-01-10 MED ORDER — DILTIAZEM HCL 30 MG PO TABS: 30.0000 mg | ORAL_TABLET | Freq: Four times a day (QID) | ORAL | Status: DC | PRN

## 2021-01-10 MED ORDER — SILVER SULFADIAZINE 1 % EX CREA
1.0000 "application " | TOPICAL_CREAM | Freq: Every day | CUTANEOUS | Status: DC
  Administered 2021-01-11 – 2021-01-15 (×4): 1 via TOPICAL
  Filled 2021-01-10: qty 85

## 2021-01-10 MED ORDER — POLYETHYLENE GLYCOL 3350 17 G PO PACK: 17.0000 g | PACK | Freq: Every day | ORAL | Status: DC | PRN

## 2021-01-10 MED ORDER — POTASSIUM CHLORIDE CRYS ER 20 MEQ PO TBCR
40.0000 meq | EXTENDED_RELEASE_TABLET | ORAL | Status: AC
  Administered 2021-01-10 (×2): 40 meq via ORAL
  Filled 2021-01-10 (×2): qty 2

## 2021-01-10 NOTE — ED Notes (Signed)
Date and time results received: 01/10/21 0923 (use smartphrase ".now" to insert current time)  Test: calcium Critical Value: 5.2  Name of Provider Notified: Dr. [name] Palau  Orders Received? Or Actions Taken?: no/na

## 2021-01-10 NOTE — ED Notes (Signed)
Patient repositioned and bed linens changed.

## 2021-01-10 NOTE — ED Provider Notes (Signed)
[facility] EMERGENCY DEPARTMENT Provider Note   CSN: [id number] Arrival date & time: 01/10/21  0830     History Chief Complaint  Patient presents with   Weakness    Todd [name] is a 65 y.o. male.  According to the patient's sister he has been extremely weak for a number days.  She came up to help him last week and he was covered in feces and was more confused than he normally was.  Patient has a history of atrial fib also  The history is provided by the patient and a relative. No language interpreter was used.  Weakness Severity:  Severe Onset quality:  Gradual Duration:  7 days Timing:  Constant Progression:  Worsening Chronicity:  Recurrent Context: not dehydration   Relieved by:  None tried Worsened by:  Nothing Associated symptoms: no abdominal pain, no chest pain, no cough, no diarrhea, no frequency, no headaches and no seizures       Past Medical History:  Diagnosis Date   AKI (acute kidney injury) ([place])    Constipated    Diabetes mellitus without complication ([place])    Diarrhea    Elevated LFTs    Goiter    Gout    Hyperlipidemia    Hypertension    Leukocytosis    Reactive thrombocytosis    Right BKA infection ([place]) 08/2016   Right leg pain    Sepsis due to undetermined organism [facility])    Thyroid disease    Wound infection after surgery 08/2016    Patient Active Problem List   Diagnosis Date Noted   Atrial fibrillation with RVR ([place]) 01/10/2021   Severe Hypophosphatemia 12/28/2020   Non-Sustained V-tach in the setting of Severe Electrolyte Derangement 12/28/2020   Cocaine abuse -on-going 12/28/2020   Alcohol abuse, continuous 12/28/2020   Syncope and collapse-suspect arrhythmia related in the setting of severe electrolyte abnormalities 12/28/2020   Diabetes mellitus type 2 with complications ([place]) 88/91/6945   Tobacco abuse 12/28/2020   Atrial fibrillation ([place]) 12/26/2020   Hypocalcemia 03/88/8280   Acute metabolic encephalopathy 03/49/1791    Hypoglycemia 12/05/2020   AKI (acute kidney injury) ([place]) 12/04/2020   Vitamin D deficiency 06/17/2019   Essential hypertension, benign 08/30/2018   Mixed hyperlipidemia 07/17/2018   Hyponatremia 02/14/2018   Medically noncompliant 02/14/2018   S/P AKA (above knee amputation) bilateral ([place]) 02/12/2018   Current smoker 02/03/2018   Leukocytosis 02/03/2018   Reactive thrombocytosis 02/03/2018   Urinary incontinence 08/14/2017   Transaminitis    Hyperuricemia 06/21/2016   HTN (hypertension) 01/05/2016   Diabetes type 2, uncontrolled ([place]) 01/05/2016   Hypothyroidism 05/10/2011    Past Surgical History:  Procedure Laterality Date   ABDOMINAL AORTOGRAM N/A 08/11/2016   Procedure: Abdominal Aortogram;  Surgeon: Waynetta Sandy, MD;  Location: [place] CV LAB;  Service: Cardiovascular;  Laterality: N/A;   ABDOMINAL AORTOGRAM W/LOWER EXTREMITY N/A 08/15/2016   Procedure: Abdominal Aortogram w/Lower Extremity;  Surgeon: Elam Dutch, MD;  Location: [place] CV LAB;  Service: Cardiovascular;  Laterality: N/A;   AMPUTATION Right 08/17/2016   Procedure: RIGHT BELOW KNEE AMPUTATION;  Surgeon: Elam Dutch, MD;  Location: [place];  Service: Vascular;  Laterality: Right;   AMPUTATION Right 09/12/2016   Procedure: AMPUTATION ABOVE KNEE;  Surgeon: Newt Minion, MD;  Location: [place];  Service: Orthopedics;  Laterality: Right;   AMPUTATION Left 08/12/2016   Procedure: LEFT BELOW KNEE AMPUTATION;  Surgeon: Newt Minion, MD;  Location: [place];  Service: Orthopedics;  Laterality: Left;   AMPUTATION Left 11/01/2017   Procedure: LEFT ABOVE KNEE AMPUTATION;  Surgeon: Newt Minion, MD;  Location: [place];  Service: Orthopedics;  Laterality: Left;   APPLICATION OF WOUND VAC Right 09/12/2016   Procedure: APPLICATION OF WOUND VAC ABOVE KNEE;  Surgeon: Newt Minion, MD;  Location: [place];  Service: Orthopedics;  Laterality: Right;   LOWER EXTREMITY ANGIOGRAPHY Bilateral 08/11/2016   Procedure:  Lower Extremity Angiography;  Surgeon: Waynetta Sandy, MD;  Location: [place] CV LAB;  Service: Cardiovascular;  Laterality: Bilateral;   PERIPHERAL VASCULAR BALLOON ANGIOPLASTY Left 08/11/2016   Procedure: Peripheral Vascular Balloon Angioplasty;  Surgeon: Waynetta Sandy, MD;  Location: [place] CV LAB;  Service: Cardiovascular;  Laterality: Left;  SFA   THYROID SURGERY         Family History  Problem Relation Age of Onset   Heart disease Mother    Pneumonia Father    Diabetes Maternal Aunt    Diabetes Maternal Uncle     Social History   Tobacco Use   Smoking status: Every Day    Packs/day: 0.25    Years: 45.00    Pack years: 11.25    Types: Cigarettes    Last attempt to quit: 11/12/2014    Years since quitting: 6.1   Smokeless tobacco: Never  Vaping Use   Vaping Use: Never used  Substance Use Topics   Alcohol use: Yes    Alcohol/week: 2.0 standard drinks    Types: 1 Glasses of wine, 1 Cans of beer per week   Drug use: No    Home Medications Prior to Admission medications   Medication Sig Start Date End Date Taking? Authorizing Provider  allopurinol (ZYLOPRIM) 100 MG tablet Take 1 tablet (100 mg total) by mouth daily. 12/06/20   Johnson, Clanford L, MD  apixaban (ELIQUIS) 5 MG TABS tablet Take 1 tablet (5 mg total) by mouth 2 (two) times daily. 12/29/20   Roxan Hockey, MD  atorvastatin (LIPITOR) 40 MG tablet TAKE 1 TABLET BY MOUTH ONCE DAILY AT 6 PM 12/06/20   Johnson, Clanford L, MD  blood glucose meter kit and supplies Dispense based on patient and insurance preference. Use up to four times daily as directed. (FOR ICD-10 E10.9, E11.9). 12/06/20   Johnson, Clanford L, MD  calcium carbonate (TUMS - DOSED IN MG ELEMENTAL CALCIUM) 500 MG chewable tablet Chew 1 tablet (200 mg of elemental calcium total) by mouth 2 (two) times daily with a meal. 12/29/20   Emokpae, Courage, MD  cetirizine (ZYRTEC) 10 MG tablet Take 1 tablet (10 mg total) by mouth daily  as needed for allergies. For allergy symptoms 12/06/20   Murlean Iba, MD  folic acid (FOLVITE) 1 MG tablet Take 1 tablet (1 mg total) by mouth daily. 12/29/20   Roxan Hockey, MD  glucose blood (ACCU-CHEK AVIVA PLUS) test strip Check blood sugars four times a day 03/12/18   [name], MD  levothyroxine (SYNTHROID) 150 MCG tablet Take 1 tablet (150 mcg total) by mouth daily. 12/29/20 12/29/21  Roxan Hockey, MD  megestrol (MEGACE) 400 MG/10ML suspension Take 10 mLs (400 mg total) by mouth 2 (two) times daily. For appetite stimulation 12/06/20   Johnson, Clanford L, MD  metoprolol succinate (TOPROL XL) 25 MG 24 hr tablet Take 1 tablet (25 mg total) by mouth daily. 12/29/20 12/29/21  Roxan Hockey, MD  phosphorus (K PHOS NEUTRAL) [phone] MG tablet Take 1 tablet (250 mg total) by mouth 2 (two) times  daily. 12/29/20   Roxan Hockey, MD  sertraline (ZOLOFT) 50 MG tablet Take 1 tablet (50 mg total) by mouth at bedtime. 12/06/20   Johnson, Clanford L, MD  sodium bicarbonate 650 MG tablet Take 1 tablet (650 mg total) by mouth 3 (three) times daily. 12/29/20   Roxan Hockey, MD  thiamine 100 MG tablet Take 1 tablet (100 mg total) by mouth daily. 12/29/20   Roxan Hockey, MD    Allergies    Lisinopril  Review of Systems   Review of Systems  Constitutional:  Negative for appetite change and fatigue.  HENT:  Negative for congestion, ear discharge and sinus pressure.   Eyes:  Negative for discharge.  Respiratory:  Negative for cough.   Cardiovascular:  Negative for chest pain.  Gastrointestinal:  Negative for abdominal pain and diarrhea.  Genitourinary:  Negative for frequency and hematuria.  Musculoskeletal:  Negative for back pain.  Skin:  Negative for rash.  Neurological:  Positive for weakness. Negative for seizures and headaches.  Psychiatric/Behavioral:  Negative for hallucinations.    Physical Exam Updated Vital Signs BP 136/69   Pulse (!) 123   Temp 98.1 F (36.7 C)  (Oral)   Resp 18   Ht _0  (1.727 m) Comment: prior to BKA  Wt 58.2 kg   SpO2 100%   BMI 19.51 kg/m   Physical Exam Vitals and nursing note reviewed.  Constitutional:      Appearance: He is well-developed.     Comments: Patient mildly lethargic  HENT:     Head: Normocephalic.     Nose: Nose normal.  Eyes:     General: No scleral icterus.    Conjunctiva/sclera: Conjunctivae normal.  Neck:     Thyroid: No thyromegaly.  Cardiovascular:     Rate and Rhythm: Normal rate and regular rhythm.     Heart sounds: No murmur heard.   No friction rub. No gallop.  Pulmonary:     Breath sounds: No stridor. No wheezing or rales.  Chest:     Chest wall: No tenderness.  Abdominal:     General: There is no distension.     Tenderness: There is no abdominal tenderness. There is no rebound.  Musculoskeletal:     Cervical back: Neck supple.     Comments: Bilateral above-the-knee amputations  Lymphadenopathy:     Cervical: No cervical adenopathy.  Skin:    Findings: No erythema or rash.  Neurological:     General: No focal deficit present.     Motor: No abnormal muscle tone.     Coordination: Coordination normal.  Psychiatric:        Behavior: Behavior normal.    ED Results / Procedures / Treatments   Labs (all labs ordered are listed, but only abnormal results are displayed) Labs Reviewed  CBC WITH DIFFERENTIAL/PLATELET - Abnormal; Notable for the following components:      Result Value   WBC 13.2 (*)    RBC 2.94 (*)    Hemoglobin 7.8 (*)    HCT 24.3 (*)    RDW 16.5 (*)    Platelets 406 (*)    Neutro Abs 9.3 (*)    All other components within normal limits  COMPREHENSIVE METABOLIC PANEL - Abnormal; Notable for the following components:   Potassium 2.7 (*)    Calcium 5.2 (*)    Total Protein 6.3 (*)    Albumin 1.7 (*)    All other components within normal limits  URINALYSIS, ROUTINE W REFLEX MICROSCOPIC  MAGNESIUM  RAPID URINE DRUG SCREEN, HOSP PERFORMED    EKG EKG  Interpretation  Date/Time:  Sunday January 10 2021 08:43:26 EDT Ventricular Rate:  153 PR Interval:    QRS Duration: 91 QT Interval:  330 QTC Calculation: 527 R Axis:   13 Text Interpretation: Atrial fibrillation with rapid V-rate Paired ventricular premature complexes Borderline low voltage, extremity leads Repolarization abnormality, prob rate related Confirmed by Milton Ferguson [phone]) on 01/10/2021 9:52:51 AM  Radiology No results found.  Procedures Procedures   Medications Ordered in ED Medications  metoprolol succinate (TOPROL-XL) 24 hr tablet 25 mg (has no administration in time range)  metoprolol tartrate (LOPRESSOR) injection 5 mg (has no administration in time range)  potassium chloride 10 mEq in 100 mL IVPB (has no administration in time range)  potassium chloride SA (KLOR-CON) CR tablet 40 mEq (has no administration in time range)  calcium gluconate 1 g/ 50 mL sodium chloride IVPB (has no administration in time range)  potassium chloride SA (KLOR-CON) CR tablet 40 mEq (has no administration in time range)  magnesium sulfate IVPB 4 g 100 mL (has no administration in time range)  sodium chloride 0.9 % bolus 1,000 mL (1,000 mLs Intravenous New Bag/Given 01/10/21 0959)    ED Course  I have reviewed the triage vital signs and the nursing notes.  Pertinent labs & imaging results that were available during my care of the patient were reviewed by me and considered in my medical decision making (see chart for details).   CRITICAL CARE Performed by: Milton Ferguson Total critical care time: 45 minutes Critical care time was exclusive of separately billable procedures and treating other patients. Critical care was necessary to treat or prevent imminent or life-threatening deterioration. Critical care was time spent personally by me on the following activities: development of treatment plan with patient and/or surrogate as well as nursing, discussions with consultants, evaluation of  patient's response to treatment, examination of patient, obtaining history from patient or surrogate, ordering and performing treatments and interventions, ordering and review of laboratory studies, ordering and review of radiographic studies, pulse oximetry and re-evaluation of patient's condition.  MDM Rules/Calculators/A&P                           Patient will be admitted for rapid atrial fib, hypokalemia, hypocalcemia and anemia Final Clinical Impression(s) / ED Diagnoses Final diagnoses:  Weakness  Persistent atrial fibrillation ([place])  Hypocalcemia    Rx / DC Orders ED Discharge Orders     None        Milton Ferguson, MD 01/10/21 1018

## 2021-01-10 NOTE — Plan of Care (Signed)
  Problem: Acute Rehab PT Goals(only PT should resolve) Goal: Pt will Roll Supine to Side Flowsheets (Taken 01/10/2021 1121) Pt will Roll Supine to Side:  with rail  with supervision Goal: Pt Will Go Supine/Side To Sit Flowsheets (Taken 01/10/2021 1121) Pt will go Supine/Side to Sit: with minimal assist Goal: Pt Will Go Sit To Supine/Side Flowsheets (Taken 01/10/2021 1121) Pt will go Sit to Supine/Side: with minimal assist Goal: Patient Will Perform Sitting Balance Flowsheets (Taken 01/10/2021 1121) Patient will perform sitting balance:  1-2 min  with bilateral UE support  with min guard assist Goal: Pt Will Transfer Bed To Chair/Chair To Bed Flowsheets (Taken 01/10/2021 1121) Pt will Transfer Bed to Chair/Chair to Bed: with mod assist Note: To wheelchair   11:22 AM, 01/10/21 Jerene Pitch, DPT Physical Therapy with [facility]  [phone] office

## 2021-01-10 NOTE — H&P (Addendum)
Patient Demographics:    Todd [name], is a 65 y.o. male  MRN: [id number]   DOB - [birth year]  Admit Date - 01/10/2021  Outpatient Primary MD for the patient is [name], FNP   Assessment & Plan:    Principal Problem:   Atrial fibrillation with RVR ([place]) Active Problems:   Hypomagnesemia   Cocaine abuse -on-going   Alcohol abuse, continuous   Diabetes mellitus type 2 with complications ([place])   Tobacco abuse   Hypothyroidism   Hypokalemia   Hypocalcemia   Brief Summary:- 65 year old with history of polysubstance abuse including alcohol tobacco and  almost daily cocaine use who is notoriously noncompliant with medications admitted on 01/10/2021 with concerns for generalized weakness and A. fib with RVR in the setting of severely abnormal thyroid and severe electrolyte derangement including hypokalemia, hypomagnesemia and hypocalcemia   A/p 1) chronic A. fib with AVR---most likely related to severe electrolyte derangement including hypophosphatemia, hypocalcemia and severely abnormal thyroid -EKG with A. fib and heart rate up to 153 Magnesium was 1.2 --Calcium is 5.2 with an albumin of 1.7 so corrected calcium is around 7 -Potassium is 2.7 -echocardiogram without significant aortic stenosis or other outflow obstruction, EF is 55 to 60%, and also without  segmental/Regional wall motion abnormalities. C/n Eliquis for stroke prophylaxis on metoprolol for rate control, if UDS is positive for cocaine will use Cardizem for rate control instead - - 2) Severe hypothyroidism--patient is notoriously noncompliant, -- Recent TSH is up to 189.8--- will repeat -Recent Free T4 is low at 0.5, will repeat -Restart levothyroxine, patient will need to follow-up with endocrinologist Dr. Loni Beckwith in  about 3 weeks for repeat TSH and free T4 advised   3)Acute on Chronic Anemia-- Hgb 7.8, baseline Hgb usually around 9 Pt takes Eliquis -Give Protonix -Okay to check stool for occult blood, iron studies B12 and folate    4)Severe hypophosphatemia, hypomagnesemia and  hypocalcemia and hypokalemia----severe electrolyte derangement and severely abnormal thyroid probably contributing to patient's arrhythmias -Patient recently had episodes of nonsustained V. Tach during recent hospitalization -Magnesium was 1.2 --Calcium is 5.2 with an albumin of 1.7 so corrected calcium is around 7 -Potassium is 2.7 -Replace and recheck electrolytes  5)DM2-A1c 6.0 reflecting excellent diabetic control PTA, but patient sugars are probably not elevated also because patient really does not eat much he drinks a lot of alcohol and just does not eat much ---Use Novolog/Humalog Sliding scale insulin with Accu-Cheks/Fingersticks as ordered   6)Polysubstance abuse + Etoh Abuse--patient uses alcohol, tobacco and cocaine almost on a daily basis -lorazepam per CIWA protocol for DT prophylaxis, thiamine and folic acid as ordered --Pt recently refused inpatient rehab for drugs and alcohol --Social worker was able to make arrangements for patient to hopefully go to [facility] as outpatient and possibly get admitted to [facility] [place] for inpatient rehab -Please see social worker documentation dated 12/29/2020 from [place]   7)Generalized weakness  and deconditioning--- most likely related to severe electrolyte abnormalities including severe hypophosphatemia, hypocalcemia and hypokalemia as well as severely abnormal thyroid -Replace electrolytes as above in #4 -Patient evaluated by physical therapist on 01/10/2021 recommends SNF rehab -CT head pending   8)Anorexia/FTT--- multifactorial, resulting in severe electrolyte abnormalities, -Megace for appetite stimulation as ordered -Okay to take multivitamin and  nutritional supplements  9)Social/Ethics--patient technically lives alone in an apartment,  -Typically uses cocaine and alcohol and tobacco on a daily basis -Recently has had multiple hospitalizations to [facility] [address] noncompliant -High risk for decompensation and death -Plan of care discussed with patient's sisters [name] and Scientist, physiological by phone -Patient is a full code  10)Leukocytosis-- ??  Reactive,  -patient with slight cough, COVID test and chest x-ray as well as UA pending  11)H/o old CVAs--- chronic lacunar infarcts within the bilateral basal Ganglia, Mild chronic small vessel ischemic changes within the cerebral white matter.  -chronic infarct in the right cerebellar hemisphere. ??? Dementia related to old CVAs and EToh Abuse   Disposition/Need for in-Hospital Stay- patient unable to be discharged at this time due to --severe electrolyte abnormalities and severely abnormal thyroid resulting in arrhythmias including A. Fib with RVR -requiring IV replacements and monitoring*   Status is: Inpatient   Remains inpatient appropriate because: Please see disposition above  Dispo: The patient is from: Home              Anticipated d/c is to: SNF              Anticipated d/c date is: 2 days              Patient currently is not medically stable to d/c. Barriers: Not Clinically Stable-    Code Status : -  Code Status: Full Code    Family Communication:    (patient is alert, awake and coherent) -Plan of care discussed with patient's sisters  [name] and [name] and [name] Consults  :  na   With History of - Reviewed by me  Past Medical History:  Diagnosis Date   AKI (acute kidney injury) ([place])    Constipated    Diabetes mellitus without complication ([place])    Diarrhea    Elevated LFTs    Goiter    Gout    Hyperlipidemia    Hypertension    Leukocytosis    Reactive thrombocytosis    Right BKA infection ([place]) 08/2016   Right leg pain     Sepsis due to undetermined organism [facility])    Thyroid disease    Wound infection after surgery 08/2016      Past Surgical History:  Procedure Laterality Date   ABDOMINAL AORTOGRAM N/A 08/11/2016   Procedure: Abdominal Aortogram;  Surgeon: Waynetta Sandy, MD;  Location: [place] [facility];  Service: Cardiovascular;  Laterality: N/A;   ABDOMINAL AORTOGRAM W/LOWER EXTREMITY N/A 08/15/2016   Procedure: Abdominal Aortogram w/Lower Extremity;  Surgeon: Elam Dutch, MD;  Location: [place] [facility];  Service: Cardiovascular;  Laterality: N/A;   AMPUTATION Right 08/17/2016   Procedure: RIGHT BELOW KNEE AMPUTATION;  Surgeon: Elam Dutch, MD;  Location: [place];  Service: Vascular;  Laterality: Right;   AMPUTATION Right 09/12/2016   Procedure: AMPUTATION ABOVE KNEE;  Surgeon: Newt Minion, MD;  Location: [place];  Service: Orthopedics;  Laterality: Right;   AMPUTATION Left 08/12/2016   Procedure: LEFT BELOW KNEE AMPUTATION;  Surgeon: Newt Minion, MD;  [facility]  OR;  Service: Orthopedics;  Laterality: Left;   AMPUTATION Left 11/01/2017   Procedure: LEFT ABOVE KNEE AMPUTATION;  Surgeon: Newt Minion, MD;  Location: [place];  Service: Orthopedics;  Laterality: Left;   APPLICATION OF WOUND VAC Right 09/12/2016   Procedure: APPLICATION OF WOUND VAC ABOVE KNEE;  Surgeon: Newt Minion, MD;  Location: [place];  Service: Orthopedics;  Laterality: Right;   LOWER EXTREMITY ANGIOGRAPHY Bilateral 08/11/2016   Procedure: Lower Extremity Angiography;  Surgeon: Waynetta Sandy, MD;  Location: [place] [facility];  Service: Cardiovascular;  Laterality: Bilateral;   PERIPHERAL VASCULAR BALLOON ANGIOPLASTY Left 08/11/2016   Procedure: Peripheral Vascular Balloon Angioplasty;  Surgeon: Waynetta Sandy, MD;  Location: [place] [facility];  Service: Cardiovascular;  Laterality: Left;  SFA   THYROID SURGERY      Chief Complaint  Patient presents with   Weakness      HPI:    Todd [name]   is a 65 y.o. male with PMHx remarkable for poorly controlled diabetes , HTN, severe hypothyroidism and chronic atrial fibrillation,, status post bilateral AKA , as well as history of polysubstance abuse including alcohol tobacco and  almost daily cocaine use who is notoriously noncompliant with medications presents to the ED with generalized weakness and tachycardia and is found to be in A. fib with RVR -EKG with A. fib with heart rate up to 153 --Magnesium was 1.2 --Calcium is 5.2 with an albumin of 1.7 so corrected calcium is around 7 -Potassium is 2.7 -Denies vomiting or diarrhea  -Additional history obtained from patient's sisters [name] and [name] No fever  Or chills  Had some cough - -Heart rate improved with metoprolol and IV fluids -WBCs 19.2--- patient with slight cough, COVID test and chest x-ray as well as UA pending --Hemoglobin is down to 7.8 from a baseline usually around 9--- stool occult blood requested      Review of systems:    In addition to the HPI above,   A full Review of  Systems was done, all other systems reviewed are negative except as noted above in HPI , .    Social History:  Reviewed by me    Social History   Tobacco Use   Smoking status: Every Day    Packs/day: 0.25    Years: 45.00    Pack years: 11.25    Types: Cigarettes    Last attempt to quit: 11/12/2014    Years since quitting: 6.1   Smokeless tobacco: Never  Substance Use Topics   Alcohol use: Yes    Alcohol/week: 2.0 standard drinks    Types: 1 Glasses of wine, 1 Cans of beer per week     Family History :  Reviewed by me    Family History  Problem Relation Age of Onset   Heart disease Mother    Pneumonia Father    Diabetes Maternal Aunt    Diabetes Maternal Uncle     Home Medications:   Prior to Admission medications   Medication Sig Start Date End Date Taking? Authorizing Provider  allopurinol (ZYLOPRIM) 100 MG tablet Take 1 tablet (100 mg total) by mouth daily. 12/06/20   Yes Johnson, Clanford L, MD  apixaban (ELIQUIS) 5 MG TABS tablet Take 1 tablet (5 mg total) by mouth 2 (two) times daily. 12/29/20  Yes Khyli Swaim, MD  atorvastatin (LIPITOR) 40 MG tablet TAKE 1 TABLET BY MOUTH ONCE DAILY AT 6 PM 12/06/20  Yes Murlean Iba, MD  blood  glucose meter kit and supplies Dispense based on patient and insurance preference. Use up to four times daily as directed. (FOR ICD-10 E10.9, E11.9). 12/06/20  Yes Johnson, Clanford L, MD  calcium carbonate (TUMS - DOSED IN MG ELEMENTAL CALCIUM) 500 MG chewable tablet Chew 1 tablet (200 mg of elemental calcium total) by mouth 2 (two) times daily with a meal. 12/29/20  Yes Koren Plyler, MD  cetirizine (ZYRTEC) 10 MG tablet Take 1 tablet (10 mg total) by mouth daily as needed for allergies. For allergy symptoms 12/06/20  Yes Johnson, Clanford L, MD  folic acid (FOLVITE) 1 MG tablet Take 1 tablet (1 mg total) by mouth daily. 12/29/20  Yes Ceira Hoeschen, MD  glucose blood (ACCU-CHEK AVIVA PLUS) test strip Check blood sugars four times a day 03/12/18  Yes [name], [name], MD  levothyroxine (SYNTHROID) 150 MCG tablet Take 1 tablet (150 mcg total) by mouth daily. 12/29/20 12/29/21 Yes Glorimar Stroope, MD  megestrol (MEGACE) 400 MG/10ML suspension Take 10 mLs (400 mg total) by mouth 2 (two) times daily. For appetite stimulation 12/06/20  Yes Johnson, Clanford L, MD  metoprolol succinate (TOPROL XL) 25 MG 24 hr tablet Take 1 tablet (25 mg total) by mouth daily. 12/29/20 12/29/21 Yes Roxan Hockey, MD  phosphorus (K PHOS NEUTRAL) 155-852-130 MG tablet Take 1 tablet (250 mg total) by mouth 2 (two) times daily. 12/29/20  Yes Meiko Ives, MD  sertraline (ZOLOFT) 50 MG tablet Take 1 tablet (50 mg total) by mouth at bedtime. 12/06/20  Yes Johnson, Clanford L, MD  silver sulfADIAZINE (SILVADENE) 1 % cream Apply 1 application topically daily. Apply to buttock   Yes [provider]  sodium bicarbonate 650 MG tablet Take 1 tablet (650  mg total) by mouth 3 (three) times daily. 12/29/20  Yes Shaiann Mcmanamon, MD  thiamine 100 MG tablet Take 1 tablet (100 mg total) by mouth daily. 12/29/20  Yes Roxan Hockey, MD     Allergies:     Allergies  Allergen Reactions   Lisinopril Other (See Comments)    Hyperkalemia / Renal failure     Physical Exam:   Vitals  Blood pressure 109/79, pulse 73, temperature 98.1 F (36.7 C), temperature source Oral, resp. rate 19, height 5' 8"  (1.727 m), weight 58.2 kg, SpO2 99 %.  Physical Examination: General appearance - alert, chronically ill appearing, and in no distress  Mental status - alert, oriented to person, place, and time, , patient does not intermittent episodes of disorientation or confusion Eyes - sclera anicteric Neck - supple, no JVD elevation , Chest - clear  to auscultation bilaterally, symmetrical air movement,  Heart - S1 and S2 normal, irregularly irregular and tachycardic  abdomen - soft, nontender, nondistended, no masses or organomegaly Neurological -generalized weakness, occasional episodes of disorientation and confusion  skin - warm, dry MSK-bilateral AKA    Data Review:    CBC Recent Labs  Lab 01/10/21 0847  WBC 13.2*  HGB 7.8*  HCT 24.3*  PLT 406*  MCV 82.7  MCH 26.5  MCHC 32.1  RDW 16.5*  LYMPHSABS 2.8  MONOABS 0.7  EOSABS 0.3  BASOSABS 0.1   ------------------------------------------------------------------------------------------------------------------  Chemistries  Recent Labs  Lab 01/10/21 0847 01/10/21 0855  NA 141  --   K 2.7*  --   CL 109  --   CO2 24  --   GLUCOSE 94  --   BUN 13  --   CREATININE 0.93  --   CALCIUM 5.2*  --  MG  --  1.2*  AST 15  --   ALT 9  --   ALKPHOS 86  --   BILITOT 0.8  --    ------------------------------------------------------------------------------------------------------------------ estimated creatinine clearance is 65.2 mL/min (by C-G formula based on SCr of 0.93  mg/dL). ------------------------------------------------------------------------------------------------------------------ No results for input(s): TSH, T4TOTAL, T3FREE, THYROIDAB in the last 72 hours.  Invalid input(s): FREET3   Coagulation profile No results for input(s): INR, PROTIME in the last 168 hours. ------------------------------------------------------------------------------------------------------------------- No results for input(s): DDIMER in the last 72 hours. -------------------------------------------------------------------------------------------------------------------  Cardiac Enzymes No results for input(s): CKMB, TROPONINI, MYOGLOBIN in the last 168 hours.  Invalid input(s): CK ------------------------------------------------------------------------------------------------------------------    Component Value Date/Time   BNP 118.6 (H) 08/03/2016 1119   BNP 37.0 07/03/2016 2143     ---------------------------------------------------------------------------------------------------------------  Urinalysis    Component Value Date/Time   COLORURINE YELLOW 12/26/2020 [place] 12/26/2020 1548   APPEARANCEUR Clear 02/09/2018 1352   LABSPEC 1.014 12/26/2020 1548   PHURINE 7.0 12/26/2020 [place] 12/26/2020 1548   [place] 12/26/2020 [place] 12/26/2020 1548   BILIRUBINUR Negative 02/09/2018 [place] 12/26/2020 1548   PROTEINUR NEGATIVE 12/26/2020 1548   NITRITE NEGATIVE 12/26/2020 1548   LEUKOCYTESUR NEGATIVE 12/26/2020 1548    ----------------------------------------------------------------------------------------------------------------   Imaging Results:    No results found.  Radiological Exams on Admission: No results found.  DVT Prophylaxis -lovenox ([place] AKA)   AM Labs Ordered, also please review Full Orders  Family Communication: Admission, patients condition and  plan of care including tests being ordered have been discussed with the patient and sisters [name] and [place] who indicate understanding and agree with the plan   Code Status - Full Code  Likely DC to SNF rehab  Condition   stable  Roxan Hockey M.D on 01/10/2021 at 3:07 PM Go to [email or web address] -  for contact info  [facility] - Office  [phone]

## 2021-01-10 NOTE — ED Notes (Signed)
Gave pt urinal and informed of need for urine sample. Pt attempted to provide sample but stated that he was unable to go.

## 2021-01-10 NOTE — ED Triage Notes (Signed)
Pt to the ED with generalized weakness for the past weak according to the family.  Family states he may need SNF placement.

## 2021-01-10 NOTE — ED Notes (Signed)
Placed patient on a bedpan as he stated he needed to use the bathroom, also provided patient with a urinal. Pt stated that he was unable to pass any stool and unable to provide a urine sample.

## 2021-01-10 NOTE — Progress Notes (Signed)
Report received from [facility] in the ED, awaiting arrival of pt at this time.

## 2021-01-10 NOTE — Evaluation (Signed)
Physical Therapy Evaluation Patient Details Name: Todd [name] MRN: [id number] DOB: May 01, 1956 Today's Date: 01/10/2021   History of Present Illness  According to the patient's sister he has been extremely weak for a number days.  She came up to help him last week and he was covered in feces and was more confused than he normally was.  Patient has a history of atrial fib also   Clinical Impression    Patient and sister present at start of session. Patient poor historian and sister able to complete some of the medical history. Patient with recent hospitalization where sister came down from [place] to take care of her brother for a couple of days. He has been in the bed and very limited in overall function and communication. Sister with need to return home at this time and no one else can assist her brother. She was bathing, cleaning and performing all ADLS. Patient able to perform rolling left and right activities in bed to get on bed pan with min assist so long as he had bed rail to help pull himself to either side. Patient reported moderate to max assist to sit up from an elevated position and required assist to maintain this position secondary to weakness. Patient will continue to benefit from skilled physical therapy in hospital and recommended venue below to increase strength, balance, endurance for safe ADLs and gait.   Follow Up Recommendations SNF;Supervision - Intermittent    Equipment Recommendations  3in1 (PT);Other (comment) (seat cushion for wheelchair)    Recommendations for Other Services       Precautions / Restrictions Precautions Precautions: Fall Restrictions Weight Bearing Restrictions: No      Mobility  Bed Mobility Overal bed mobility: Needs Assistance Bed Mobility: Rolling;Supine to Sit Rolling: Min assist (verbal and tactile cues; with use of bed rails)   Supine to sit: Mod assist (verbal and tactile cues; to transition to long sitting from sitting  with head of bed elevated abotu 30 degrees)          Transfers Overall transfer level:  (unable at this time)                  Ambulation/Gait                Stairs            Wheelchair Mobility    Modified Rankin (Stroke Patients Only)       Balance Overall balance assessment: Needs assistance Sitting-balance support: Bilateral upper extremity supported Sitting balance-Leahy Scale: Poor Sitting balance - Comments: needs physcial assist and 2 hand support to maintain upright sitting position Postural control: Posterior lean                                   Pertinent Vitals/Pain Pain Assessment: Faces Faces Pain Scale: Hurts even more Pain Location: tailbone Pain Intervention(s): Monitored during session    Home Living Family/patient expects to be discharged to:: Private residence Living Arrangements: Alone Available Help at Discharge:  (no assistance available) Type of Home: Apartment Home Access: Ramped entrance     Home Layout: One level Home Equipment: Wheelchair - manual (overhead tripod)      Prior Function Level of Independence: Independent with assistive device(s)         Comments: prior to all hospitilizations, but since last hospitalization he has needed physical assist wtih all ADLs and self care  Hand Dominance        Extremity/Trunk Assessment   Upper Extremity Assessment Upper Extremity Assessment: Generalized weakness    Lower Extremity Assessment Lower Extremity Assessment: Generalized weakness       Communication      Cognition Arousal/Alertness: Awake/alert Behavior During Therapy: Flat affect Overall Cognitive Status: Difficult to assess                                 General Comments: follows directions with encouragment, doesn't initially answer questions, sister assisted with history      General Comments General comments (skin integrity, edema, etc.): pain  noted on tailbone, previous wounds on thighs/backside - sister reports skin is raw but no open wounds    Exercises Amputee Exercises Knee Flexion: 5 reps;Both;Strengthening   Assessment/Plan    PT Assessment Patient needs continued PT services  PT Problem List Decreased strength;Decreased activity tolerance;Decreased mobility;Decreased balance       PT Treatment Interventions Therapeutic activities;Therapeutic exercise;Neuromuscular re-education;Balance training;Patient/family education;Wheelchair mobility training    PT Goals (Current goals can be found in the Care Plan section)  Acute Rehab PT Goals Patient Stated Goal: to go home PT Goal Formulation: With patient/family Time For Goal Achievement: 01/24/21 Potential to Achieve Goals: Fair    Frequency Min 3X/week   Barriers to discharge Decreased caregiver support sister lives in [place] adn came down to assist after last hospitilization but needs to return home - no other family or friends to assist with patient.    Co-evaluation               AM-PAC PT "6 Clicks" Mobility  Outcome Measure Help needed turning from your back to your side while in a flat bed without using bedrails?: A Lot Help needed moving from lying on your back to sitting on the side of a flat bed without using bedrails?: Total Help needed moving to and from a bed to a chair (including a wheelchair)?: Total Help needed standing up from a chair using your arms (e.g., wheelchair or bedside chair)?: Total Help needed to walk in hospital room?: Total Help needed climbing 3-5 steps with a railing? : Total 6 Click Score: 7    End of Session Equipment Utilized During Treatment:  (none) Activity Tolerance: Patient limited by lethargy Patient left: in bed;with family/visitor present Nurse Communication: Mobility status (sister's desire for patient to get psych eval) PT Visit Diagnosis: Muscle weakness (generalized) (M62.81);History of falling (Z91.81)     Time: 1040-1105 PT Time Calculation (min) (ACUTE ONLY): 25 min   Charges:   PT Evaluation $PT Eval Moderate Complexity: 1 Mod PT Treatments $Therapeutic Activity: 8-22 mins      11:23 AM, 01/10/21 Jerene Pitch, DPT Physical Therapy with [facility]  [phone] office

## 2021-01-10 NOTE — ED Notes (Signed)
Date and time results received: 01/10/21 0923 (use smartphrase ".now" to insert current time)  Test: potassium Critical Value: 2.7  Name of Provider Notified: Dr. [name] Palau  Orders Received? Or Actions Taken?: no/na

## 2021-01-11 ENCOUNTER — Encounter (HOSPITAL_COMMUNITY): Payer: Self-pay | Admitting: Family Medicine

## 2021-01-11 DIAGNOSIS — D500 Iron deficiency anemia secondary to blood loss (chronic): Secondary | ICD-10-CM | POA: Diagnosis present

## 2021-01-11 DIAGNOSIS — I4891 Unspecified atrial fibrillation: Secondary | ICD-10-CM | POA: Diagnosis not present

## 2021-01-11 DIAGNOSIS — Z515 Encounter for palliative care: Secondary | ICD-10-CM

## 2021-01-11 DIAGNOSIS — Z7189 Other specified counseling: Secondary | ICD-10-CM | POA: Diagnosis not present

## 2021-01-11 DIAGNOSIS — K921 Melena: Secondary | ICD-10-CM

## 2021-01-11 LAB — IRON AND TIBC
Iron: 30 ug/dL — ABNORMAL LOW (ref 45–182)
Saturation Ratios: 21 % (ref 17.9–39.5)
TIBC: 145 ug/dL — ABNORMAL LOW (ref 250–450)
UIBC: 115 ug/dL

## 2021-01-11 LAB — CBC
HCT: 21 % — ABNORMAL LOW (ref 39.0–52.0)
Hemoglobin: 6.7 g/dL — CL (ref 13.0–17.0)
MCH: 26.9 pg (ref 26.0–34.0)
MCHC: 31.9 g/dL (ref 30.0–36.0)
MCV: 84.3 fL (ref 80.0–100.0)
Platelets: 385 10*3/uL (ref 150–400)
RBC: 2.49 MIL/uL — ABNORMAL LOW (ref 4.22–5.81)
RDW: 16.6 % — ABNORMAL HIGH (ref 11.5–15.5)
WBC: 11 10*3/uL — ABNORMAL HIGH (ref 4.0–10.5)
nRBC: 0 % (ref 0.0–0.2)

## 2021-01-11 LAB — URINALYSIS, ROUTINE W REFLEX MICROSCOPIC
Bilirubin Urine: NEGATIVE
Glucose, UA: NEGATIVE mg/dL
Ketones, ur: NEGATIVE mg/dL
Nitrite: NEGATIVE
Protein, ur: 30 mg/dL — AB
Specific Gravity, Urine: 1.017 (ref 1.005–1.030)
pH: 5 (ref 5.0–8.0)

## 2021-01-11 LAB — RAPID URINE DRUG SCREEN, HOSP PERFORMED
Amphetamines: NOT DETECTED
Barbiturates: NOT DETECTED
Benzodiazepines: NOT DETECTED
Cocaine: NOT DETECTED
Opiates: NOT DETECTED
Tetrahydrocannabinol: NOT DETECTED

## 2021-01-11 LAB — GLUCOSE, CAPILLARY
Glucose-Capillary: 139 mg/dL — ABNORMAL HIGH (ref 70–99)
Glucose-Capillary: 180 mg/dL — ABNORMAL HIGH (ref 70–99)
Glucose-Capillary: 291 mg/dL — ABNORMAL HIGH (ref 70–99)

## 2021-01-11 LAB — FOLATE: Folate: 29.1 ng/mL (ref >5.9)

## 2021-01-11 LAB — FERRITIN: Ferritin: 352 ng/mL — ABNORMAL HIGH (ref 24–336)

## 2021-01-11 LAB — RENAL FUNCTION PANEL
Albumin: 1.6 g/dL — ABNORMAL LOW (ref 3.5–5.0)
Anion gap: 7 (ref 5–15)
BUN: 19 mg/dL (ref 8–23)
CO2: 18 mmol/L — ABNORMAL LOW (ref 22–32)
Calcium: 5.1 mg/dL — CL (ref 8.9–10.3)
Chloride: 112 mmol/L — ABNORMAL HIGH (ref 98–111)
Creatinine, Ser: 1.23 mg/dL (ref 0.61–1.24)
GFR, Estimated: >60 mL/min (ref >60)
Glucose, Bld: 240 mg/dL — ABNORMAL HIGH (ref 70–99)
Phosphorus: 4.2 mg/dL (ref 2.5–4.6)
Potassium: 3.9 mmol/L (ref 3.5–5.1)
Sodium: 137 mmol/L (ref 135–145)

## 2021-01-11 LAB — T4, FREE: Free T4: 1.78 ng/dL — ABNORMAL HIGH (ref 0.61–1.12)

## 2021-01-11 LAB — VITAMIN B12: Vitamin B-12: 395 pg/mL (ref 180–914)

## 2021-01-11 LAB — OCCULT BLOOD X 1 CARD TO LAB, STOOL: Fecal Occult Bld: POSITIVE — AB

## 2021-01-11 LAB — TSH: TSH: 0.805 u[IU]/mL (ref 0.350–4.500)

## 2021-01-11 LAB — MAGNESIUM: Magnesium: 2.4 mg/dL (ref 1.7–2.4)

## 2021-01-11 MED ORDER — SODIUM CHLORIDE 0.9% IV SOLUTION: Freq: Once | INTRAVENOUS | Status: AC

## 2021-01-11 MED ORDER — INSULIN ASPART 100 UNIT/ML IJ SOLN
0.0000 [IU] | Freq: Every day | INTRAMUSCULAR | Status: DC
  Administered 2021-01-11: 3 [IU] via SUBCUTANEOUS

## 2021-01-11 MED ORDER — FUROSEMIDE 10 MG/ML IJ SOLN
40.0000 mg | Freq: Once | INTRAMUSCULAR | Status: AC
  Administered 2021-01-12: 40 mg via INTRAVENOUS
  Filled 2021-01-11: qty 4

## 2021-01-11 MED ORDER — INSULIN ASPART 100 UNIT/ML IJ SOLN
3.0000 [IU] | Freq: Three times a day (TID) | INTRAMUSCULAR | Status: DC
  Administered 2021-01-11 – 2021-01-15 (×8): 3 [IU] via SUBCUTANEOUS

## 2021-01-11 MED ORDER — FUROSEMIDE 10 MG/ML IJ SOLN
20.0000 mg | Freq: Once | INTRAMUSCULAR | Status: AC
  Administered 2021-01-11: 20 mg via INTRAVENOUS
  Filled 2021-01-11: qty 2

## 2021-01-11 MED ORDER — POLYETHYLENE GLYCOL 3350 17 G PO PACK
17.0000 g | PACK | Freq: Every day | ORAL | Status: DC
  Administered 2021-01-11 – 2021-01-15 (×4): 17 g via ORAL
  Filled 2021-01-11 (×4): qty 1

## 2021-01-11 MED ORDER — INSULIN ASPART 100 UNIT/ML IJ SOLN
0.0000 [IU] | Freq: Three times a day (TID) | INTRAMUSCULAR | Status: DC
  Administered 2021-01-11: 2 [IU] via SUBCUTANEOUS
  Administered 2021-01-11: 1 [IU] via SUBCUTANEOUS
  Administered 2021-01-12: 2 [IU] via SUBCUTANEOUS
  Administered 2021-01-14 – 2021-01-15 (×2): 1 [IU] via SUBCUTANEOUS
  Administered 2021-01-15: 2 [IU] via SUBCUTANEOUS

## 2021-01-11 MED ORDER — LEVOTHYROXINE SODIUM 100 MCG PO TABS
100.0000 ug | ORAL_TABLET | Freq: Every day | ORAL | Status: DC
  Administered 2021-01-12 – 2021-01-15 (×3): 100 ug via ORAL
  Filled 2021-01-11 (×3): qty 1

## 2021-01-11 MED ORDER — SODIUM CHLORIDE 0.9 % IV SOLN
1.0000 g | INTRAVENOUS | Status: DC
Start: 2021-01-11 — End: 2021-01-14
  Administered 2021-01-11 – 2021-01-12 (×2): 1 g via INTRAVENOUS
  Filled 2021-01-11 (×2): qty 10

## 2021-01-11 NOTE — Consult Note (Signed)
@LOGO @   Referring Provider: [facility] hospitalist Primary Care Physician:  [name], FNP Primary Gastroenterologist: Previously [facility].  Date of Admission: 01/10/2021 Date of Consultation: 01/11/2021  Reason for Consultation: Anemia, iron deficiency  HPI:  Todd [name] is a 65 y.o. year old male with history of diabetes, atrial fibrillation on Eliquis, HTN, HLD, hypothyroidism, bilateral AKA, polysubstance abuse including alcohol, tobacco, marijuana, and cocaine, medication noncompliance who presented to the ED on 7/31 due to generalized weakness.   ED course: EKG with atrial fibrillation with RVR, ventricular rate 153. Hemoglobin 7.8 with normocytic indices, WBC 13.2, platelets 406, potassium 2.7, magnesium 1.2, calcium 5.2, sodium 141, creatinine 0.93, BUN 13 albumin 1.7, LFTs normal. Chest CT with new diffuse interstitial infiltrates, suspicious for interstitial edema. CT head with no acute findings. He was given potassium, sodium bicarbonate, calcium gluconate, magnesium, IV fluids, metoprolol.   Hemoglobin declined to 6.7 this morning.  Iron panel with ferritin 352, iron 30, saturation 21%.  B12 395.  Folate 29.1. 2 unit PRBCs has been ordered.    Today: Patient is a poor historian.  States he lives at home by himself and has a good friend that comes over most days.  His sister also checks on him.  States he came to the emergency room due to decreased appetite and weakness.  States he goes through spells like this at times.  He ate most of his breakfast this morning without trouble and is currently eating lunch.  Denies abdominal pain, nausea, vomiting, dysphagia, GERD, BRBPR, melena, constipation.  Reports intermittent diarrhea which is likely related to, "something I ate".  Diarrhea occurs maybe once a month.  He manages this with Imodium.   Denies fever, chills, chest pain, heart palpitations, shortness of breath, cough, dysuria, hematuria.   Denies NSAID  use. May drink 2-3 beers per week. Uses marijuana and cocaine intermittently.  Reports last cocaine use was about 1 to 2 weeks ago.  Does not take his prescription medications as prescribed.  States he does take his thyroid medicine every day, but otherwise his other medications he takes sometimes.   Colonoscopy April 2016 with left-sided diverticulosis, otherwise normal.  Due for repeat in 2026. No prior EGD.   Past Medical History:  Diagnosis Date   AKI (acute kidney injury) ([place])    Constipated    Diabetes mellitus without complication ([place])    Diarrhea    Elevated LFTs    Goiter    Gout    Hyperlipidemia    Hypertension    Leukocytosis    Reactive thrombocytosis    Right BKA infection ([place]) 08/2016   Right leg pain    Sepsis due to undetermined organism [facility])    Thyroid disease    Wound infection after surgery 08/2016    Past Surgical History:  Procedure Laterality Date   ABDOMINAL AORTOGRAM N/A 08/11/2016   Procedure: Abdominal Aortogram;  Surgeon: Waynetta Sandy, MD;  Location: [place] [facility];  Service: Cardiovascular;  Laterality: N/A;   ABDOMINAL AORTOGRAM W/LOWER EXTREMITY N/A 08/15/2016   Procedure: Abdominal Aortogram w/Lower Extremity;  Surgeon: Elam Dutch, MD;  Location: [place] [facility];  Service: Cardiovascular;  Laterality: N/A;   AMPUTATION Right 08/17/2016   Procedure: RIGHT BELOW KNEE AMPUTATION;  Surgeon: Elam Dutch, MD;  Location: [place];  Service: Vascular;  Laterality: Right;   AMPUTATION Right 09/12/2016   Procedure: AMPUTATION ABOVE KNEE;  Surgeon: Newt Minion, MD;  Location: [place];  Service: Orthopedics;  Laterality: Right;   AMPUTATION Left 08/12/2016   Procedure: LEFT BELOW KNEE AMPUTATION;  Surgeon: Newt Minion, MD;  Location: [place];  Service: Orthopedics;  Laterality: Left;   AMPUTATION Left 11/01/2017   Procedure: LEFT ABOVE KNEE AMPUTATION;  Surgeon: Newt Minion, MD;  Location: [place];  Service: Orthopedics;  Laterality:  Left;   APPLICATION OF WOUND VAC Right 09/12/2016   Procedure: APPLICATION OF WOUND VAC ABOVE KNEE;  Surgeon: Newt Minion, MD;  Location: [place];  Service: Orthopedics;  Laterality: Right;   LOWER EXTREMITY ANGIOGRAPHY Bilateral 08/11/2016   Procedure: Lower Extremity Angiography;  Surgeon: Waynetta Sandy, MD;  Location: [place] [facility];  Service: Cardiovascular;  Laterality: Bilateral;   PERIPHERAL VASCULAR BALLOON ANGIOPLASTY Left 08/11/2016   Procedure: Peripheral Vascular Balloon Angioplasty;  Surgeon: Waynetta Sandy, MD;  Location: [place] [facility];  Service: Cardiovascular;  Laterality: Left;  SFA   THYROID SURGERY      Prior to Admission medications   Medication Sig Start Date End Date Taking? Authorizing Provider  allopurinol (ZYLOPRIM) 100 MG tablet Take 1 tablet (100 mg total) by mouth daily. 12/06/20  Yes Johnson, Clanford L, MD  apixaban (ELIQUIS) 5 MG TABS tablet Take 1 tablet (5 mg total) by mouth 2 (two) times daily. 12/29/20  Yes Emokpae, Courage, MD  atorvastatin (LIPITOR) 40 MG tablet TAKE 1 TABLET BY MOUTH ONCE DAILY AT 6 PM 12/06/20  Yes Johnson, Clanford L, MD  blood glucose meter kit and supplies Dispense based on patient and insurance preference. Use up to four times daily as directed. (FOR ICD-10 E10.9, E11.9). 12/06/20  Yes Johnson, Clanford L, MD  calcium carbonate (TUMS - DOSED IN MG ELEMENTAL CALCIUM) 500 MG chewable tablet Chew 1 tablet (200 mg of elemental calcium total) by mouth 2 (two) times daily with a meal. 12/29/20  Yes Emokpae, Courage, MD  cetirizine (ZYRTEC) 10 MG tablet Take 1 tablet (10 mg total) by mouth daily as needed for allergies. For allergy symptoms 12/06/20  Yes Johnson, Clanford L, MD  folic acid (FOLVITE) 1 MG tablet Take 1 tablet (1 mg total) by mouth daily. 12/29/20  Yes Emokpae, Courage, MD  glucose blood (ACCU-CHEK AVIVA PLUS) test strip Check blood sugars four times a day 03/12/18  Yes [name], [name], MD  levothyroxine  (SYNTHROID) 150 MCG tablet Take 1 tablet (150 mcg total) by mouth daily. 12/29/20 12/29/21 Yes Emokpae, Courage, MD  megestrol (MEGACE) 400 MG/10ML suspension Take 10 mLs (400 mg total) by mouth 2 (two) times daily. For appetite stimulation 12/06/20  Yes Johnson, Clanford L, MD  metoprolol succinate (TOPROL XL) 25 MG 24 hr tablet Take 1 tablet (25 mg total) by mouth daily. 12/29/20 12/29/21 Yes Roxan Hockey, MD  phosphorus (K PHOS NEUTRAL) 155-852-130 MG tablet Take 1 tablet (250 mg total) by mouth 2 (two) times daily. 12/29/20  Yes Emokpae, Courage, MD  sertraline (ZOLOFT) 50 MG tablet Take 1 tablet (50 mg total) by mouth at bedtime. 12/06/20  Yes Johnson, Clanford L, MD  silver sulfADIAZINE (SILVADENE) 1 % cream Apply 1 application topically daily. Apply to buttock   Yes [provider]  sodium bicarbonate 650 MG tablet Take 1 tablet (650 mg total) by mouth 3 (three) times daily. 12/29/20  Yes Emokpae, Courage, MD  thiamine 100 MG tablet Take 1 tablet (100 mg total) by mouth daily. 12/29/20  Yes Roxan Hockey, MD    Current Facility-Administered Medications  Medication Dose Route Frequency Provider Last Rate Last Admin  0.9 %  sodium chloride infusion (Manually program via Guardrails IV Fluids)   Intravenous Once Emokpae, Courage, MD       0.9 %  sodium chloride infusion  250 mL Intravenous PRN Emokpae, Courage, MD       acetaminophen (TYLENOL) tablet 650 mg  650 mg Oral Q6H PRN Emokpae, Courage, MD       Or   acetaminophen (TYLENOL) suppository 650 mg  650 mg Rectal Q6H PRN Emokpae, Courage, MD       allopurinol (ZYLOPRIM) tablet 100 mg  100 mg Oral Daily Emokpae, Courage, MD   100 mg at 01/11/21 1011   atorvastatin (LIPITOR) tablet 40 mg  40 mg Oral q1800 Emokpae, Courage, MD   40 mg at 01/10/21 1833   bisacodyl (DULCOLAX) suppository 10 mg  10 mg Rectal Daily PRN Roxan Hockey, MD       calcium carbonate (TUMS - dosed in mg elemental calcium) chewable tablet 200 mg of elemental  calcium  1 tablet Oral BID WC Emokpae, Courage, MD   200 mg of elemental calcium at 01/11/21 1011   cefTRIAXone (ROCEPHIN) 1 g in sodium chloride 0.9 % 100 mL IVPB  1 g Intravenous Q24H Emokpae, Courage, MD 200 mL/hr at 01/11/21 1215 1 g at 01/11/21 1215   dextrose 5 % and 0.45 % NaCl with KCl 20 mEq/L infusion   Intravenous Continuous Denton Brick, Courage, MD 50 mL/hr at 01/11/21 1207 Rate Change at 01/11/21 1207   diltiazem (CARDIZEM) tablet 30 mg  30 mg Oral Q6H PRN Roxan Hockey, MD       folic acid (FOLVITE) tablet 1 mg  1 mg Oral Daily Emokpae, Courage, MD   1 mg at 01/11/21 1011   furosemide (LASIX) injection 40 mg  40 mg Intravenous Once Emokpae, Courage, MD       Gerhardt's butt cream   Topical TID Emokpae, Courage, MD       insulin aspart (novoLOG) injection 0-5 Units  0-5 Units Subcutaneous QHS Emokpae, Courage, MD       insulin aspart (novoLOG) injection 0-9 Units  0-9 Units Subcutaneous TID WC Emokpae, Courage, MD       insulin aspart (novoLOG) injection 3 Units  3 Units Subcutaneous TID WC Emokpae, Courage, MD       levothyroxine (SYNTHROID) tablet 150 mcg  150 mcg Oral Q0600 Denton Brick, Courage, MD   150 mcg at 01/11/21 3818   megestrol (MEGACE) 400 MG/10ML suspension 400 mg  400 mg Oral BID Emokpae, Courage, MD   400 mg at 01/11/21 1012   metoprolol succinate (TOPROL-XL) 24 hr tablet 25 mg  25 mg Oral Daily Emokpae, Courage, MD   25 mg at 01/11/21 1019   multivitamin with minerals tablet 1 tablet  1 tablet Oral Daily Emokpae, Courage, MD   1 tablet at 01/11/21 1012   ondansetron (ZOFRAN) tablet 4 mg  4 mg Oral Q6H PRN Emokpae, Courage, MD       Or   ondansetron (ZOFRAN) injection 4 mg  4 mg Intravenous Q6H PRN Emokpae, Courage, MD       pantoprazole (PROTONIX) EC tablet 40 mg  40 mg Oral BID Emokpae, Courage, MD   40 mg at 01/11/21 1012   phosphorus (K PHOS NEUTRAL) tablet 250 mg  250 mg Oral BID Emokpae, Courage, MD   250 mg at 01/11/21 1011   polyethylene glycol (MIRALAX / GLYCOLAX)  packet 17 g  17 g Oral Daily Emokpae, Courage, MD   17 g at 01/11/21  1210   sertraline (ZOLOFT) tablet 50 mg  50 mg Oral QHS Emokpae, Courage, MD   50 mg at 01/10/21 2148   silver sulfADIAZINE (SILVADENE) 1 % cream 1 application  1 application Topical Daily Emokpae, Courage, MD   1 application at 38/33/38 1000   sodium bicarbonate tablet 650 mg  650 mg Oral TID Roxan Hockey, MD   650 mg at 01/11/21 1011   sodium chloride flush (NS) 0.9 % injection 3 mL  3 mL Intravenous Q12H Emokpae, Courage, MD       sodium chloride flush (NS) 0.9 % injection 3 mL  3 mL Intravenous Q12H Emokpae, Courage, MD       sodium chloride flush (NS) 0.9 % injection 3 mL  3 mL Intravenous PRN Emokpae, Courage, MD       thiamine tablet 100 mg  100 mg Oral Daily Emokpae, Courage, MD   100 mg at 01/11/21 1012   traZODone (DESYREL) tablet 50 mg  50 mg Oral QHS PRN Roxan Hockey, MD   50 mg at 01/10/21 2150    Allergies as of 01/10/2021 - Review Complete 01/10/2021  Allergen Reaction Noted   Lisinopril Other (See Comments) 08/31/2016    Family History  Problem Relation Age of Onset   Heart disease Mother    Pneumonia Father    Diabetes Maternal Aunt    Diabetes Maternal Uncle     Social History   Socioeconomic History   Marital status: Single    Spouse name: Not on file   Number of children: Not on file   Years of education: Not on file   Highest education level: Not on file  Occupational History   Occupation: retired    Comment: drove a Medical illustrator  Tobacco Use   Smoking status: Every Day    Packs/day: 0.25    Years: 45.00    Pack years: 11.25    Types: Cigarettes    Last attempt to quit: 11/12/2014    Years since quitting: 6.1   Smokeless tobacco: Never  Vaping Use   Vaping Use: Never used  Substance and Sexual Activity   Alcohol use: Yes    Alcohol/week: 2.0 standard drinks    Types: 1 Glasses of wine, 1 Cans of beer per week   Drug use: No   Sexual activity: Not Currently  Other Topics  Concern   Not on file  Social History Narrative   09/23/20 - Lives alone, uses a wheelchair, double amputee, not married, no children - HHA comes in 3x per week to help him bathe and set up weekly meds.   Social Determinants of Health   Financial Resource Strain: Low Risk    Difficulty of Paying Living Expenses: Not very hard  Food Insecurity: No Food Insecurity   Worried About Charity fundraiser in the Last Year: Never true   Ran Out of Food in the Last Year: Never true  Transportation Needs: No Transportation Needs   Lack of Transportation (Medical): No   Lack of Transportation (Non-Medical): No  Physical Activity: Inactive   Days of Exercise per Week: 0 days   Minutes of Exercise per Session: 0 min  Stress: No Stress Concern Present   Feeling of Stress : Only a little  Social Connections: Socially Isolated   Frequency of Communication with Friends and Family: More than three times a week   Frequency of Social Gatherings with Friends and Family: More than three times a week   Attends Religious Services: Never  Active Member of Clubs or Organizations: No   Attends Archivist Meetings: Never   Marital Status: Never married  Human resources officer Violence: Not At Risk   Fear of Current or Ex-Partner: No   Emotionally Abused: No   Physically Abused: No   Sexually Abused: No    Review of Systems: Gen: See HPI CV: See HPI Resp: See HPI GI: See HPI GU : See HPI MS: Denies joint pain Derm: Denies rash, Psych: Denies depression, anxiety Heme: See HPI  Physical Exam: Vital signs in last 24 hours: Temp:  [97.3 F (36.3 C)-98.1 F (36.7 C)] 97.9 F (36.6 C) (08/01 0533) Pulse Rate:  [73-82] 82 (08/01 1018) Resp:  [18-20] 20 (08/01 0533) BP: (92-129)/(64-87) 104/70 (08/01 1018) SpO2:  [92 %-100 %] 100 % (08/01 0533) Weight:  [59.1 kg] 59.1 kg (07/31 1617) Last BM Date: 01/10/21 General:   Alert,  Well-developed, well-nourished, pleasant and cooperative in  NAD Head:  Normocephalic and atraumatic. Eyes:  Sclera clear, no icterus.   Conjunctiva pink. Ears:  Normal auditory acuity. Nose:  No deformity, discharge,  or lesions. Lungs:  Clear throughout to auscultation.   No wheezes, crackles, or rhonchi. No acute distress. Heart:  Regular rate and rhythm; no murmurs, clicks, rubs,  or gallops. Abdomen: Protuberant abdomen that is soft and nontender.  No masses, hepatosplenomegaly or hernias noted. Normal bowel sounds, without guarding, and without rebound.   Rectal:  Deferred  Msk: Bilateral AKA. Extremities:  Without edema. Neurologic:  Alert and  oriented x3;  grossly normal neurologically. Skin:  Intact without significant lesions or rashes. Psych:  Normal mood and affect.  Intake/Output from previous day: 07/31 0701 - 08/01 0700 In: 2970 [P.O.:720; I.V.:1000; IV Piggyback:1250] Out: -  Intake/Output this shift: Total I/O In: 480 [P.O.:480] Out: 225 [Urine:225]  Lab Results: Recent Labs    01/10/21 0847 01/11/21 0509  WBC 13.2* 11.0*  HGB 7.8* 6.7*  HCT 24.3* 21.0*  PLT 406* 385   BMET Recent Labs    01/10/21 0847 01/11/21 0509  NA 141 137  K 2.7* 3.9  CL 109 112*  CO2 24 18*  GLUCOSE 94 240*  BUN 13 19  CREATININE 0.93 1.23  CALCIUM 5.2* 5.1*   LFT Recent Labs    01/10/21 0847 01/11/21 0509  PROT 6.3*  --   ALBUMIN 1.7* 1.6*  AST 15  --   ALT 9  --   ALKPHOS 86  --   BILITOT 0.8  --     Studies/Results: CT HEAD WO CONTRAST  Result Date: 01/10/2021 CLINICAL DATA:  Mental status change, unknown cause. Additional history provided: Generalized weakness. EXAM: CT HEAD WITHOUT CONTRAST TECHNIQUE: Contiguous axial images were obtained from the base of the skull through the vertex without intravenous contrast. COMPARISON:  Prior head CT examinations 12/21/2020 and earlier. FINDINGS: Brain: Mild-to-moderate generalized cerebral atrophy. Comparatively mild cerebellar atrophy. Redemonstrated chronic lacunar infarcts  within the bilateral basal ganglia. Mild patchy and ill-defined hypoattenuation within the cerebral white matter, nonspecific but compatible with chronic small vessel ischemic disease. Redemonstrated chronic infarct within the right cerebellar hemisphere. There is no acute intracranial hemorrhage. No demarcated cortical infarct. No extra-axial fluid collection. No evidence of an intracranial mass. No midline shift. Vascular: No hyperdense vessel.  Atherosclerotic calcifications. Skull: Unchanged nonspecific sclerotic thickening of the outer table of the left parietal calvarium, benign in appearance. Sinuses/Orbits: Visualized orbits show no acute finding. No significant paranasal sinus disease. IMPRESSION: No evidence of acute intracranial abnormality. Redemonstrated  chronic lacunar infarcts within the bilateral basal ganglia. Mild chronic small vessel ischemic changes within the cerebral white matter. Redemonstrated chronic infarct in the right cerebellar hemisphere. Electronically Signed   By: Kellie Simmering DO   On: 01/10/2021 17:40   DG CHEST PORT 1 VIEW  Result Date: 01/10/2021 CLINICAL DATA:  Nonproductive cough. Diabetes and hypertension. EXAM: PORTABLE CHEST 1 VIEW COMPARISON:  04/28/2021 FINDINGS: The heart size and mediastinal contours are within normal limits. New diffuse interstitial infiltrates are seen, suspicious for interstitial edema. No evidence of pulmonary airspace disease or pleural effusion. Multiple surgical clips again seen in the area the thoracic inlet and lower neck. IMPRESSION: New diffuse interstitial infiltrates, suspicious for interstitial edema. Electronically Signed   By: Marlaine Hind M.D.   On: 01/10/2021 19:47    Impression: 65 y.o. year old male with history of diabetes, atrial fibrillation on Eliquis, HTN, HLD, hypothyroidism, bilateral AKA, polysubstance abuse including alcohol, tobacco, marijuana, and cocaine, medication noncompliance who was admitted 7/31 with concerns  for generalized weakness, found to have A. fib with RVR, electrolyte derangements including hypokalemia, hypomagnesemia, and hypocalcemia, anemia with low serum iron, and UTI.  GI consulted for further evaluation of anemia.  Anemia: Hemoglobin 7.8 on admission, down from 9.9 two weeks ago, 10.2 in June 2022, and previously 13.5 in August 2021.  He was recently started on Eliquis due to new diagnosis of atrial fibrillation in July.  Iron panel is not consistent with iron deficiency anemia as ferritin is 352 (H), saturation 21%, iron 30 (L), TIBC 145 (L).  Vitamin B12 and folate are within normal limits.  Kidney function is normal but it appears he does have history of kidney dysfunction previously. BUN also normal. Hemoccult is ordered, but has not been completed. He denies overt GI bleeding or any significant upper or lower GI symptoms.  Denies chronic NSAID use.  He does drink alcohol occasionally as well as intermittent use of marijuana and cocaine.  UDS is still pending.  His hemoglobin declined to 6.7 this morning which is likely dilutional.  2 unit PRBCs has been ordered per hospitalist.  Last colonoscopy in 2016 with left-sided diverticulosis, otherwise normal.  No prior EGD.  Etiology of declining hemoglobin is not clear. Iron panel is more consistent with anemia of chronic disease. Can't rule out occult blood loss from essentially anywhere in the GI tract in the setting of Eliquis. Would consider EGD and colonoscopy to evaluate further as patient is going to need chronic anticoagulation. Discussed with patient, and he is agreeable.  With last dose of Eliquis yesterday evening, procedures would likely need to be completed Wednesday.  This will also depend on correction of electrolyte abnormalities and UDS results.  Will discuss with Dr. Abbey Chatters. Agree with continuing PPI BID empirically.   Electrolyte abnormalities: Hypokalemia/hypomagnesemia resolved.  Hypocalcemia continues. Management per hospitalist.     Atrial fibrillation with RVR: Likely influenced by electrolyte abnormalities and medication noncompliance.  This has improved with metoprolol.  Plan: 1.  Possible EGD and colonoscopy on 8/3 for further evaluation of anemia.  This will depend on correction of electrolyte abnormalities and UDS results.  If positive for cocaine, this may preclude ability to arrange procedures.  Will discuss with Dr. Abbey Chatters. 2.  Continue empiric PPI twice daily. 3.  Continue to hold Eliquis for now. 4.  Agree with transfusing 2 unit PRBCs (ordered by hospitalist). 5.  Continue to monitor H&H and for overt GI bleeding. 6.  Management of electrolyte abnormalities per hospitalist.  LOS: 1 day    01/11/2021, 12:48 PM   Aliene Altes, [facility]

## 2021-01-11 NOTE — Progress Notes (Signed)
OT Cancellation Note  Patient Details Name: Todd [name] MRN: [id number] DOB: 1955/11/15   Cancelled Treatment:    Reason Eval/Treat Not Completed: Patient declined, no reason specified. Pt requested to finish breakfast. This OT returned ~20 minutes later at which time pt refused repeatedly stating, "we can just do it later." Will attempt evaluation later as time permits.   Cable Fearn OT, MOT   Larey Seat 01/11/2021, 10:19 AM

## 2021-01-11 NOTE — Progress Notes (Signed)
Patient Demographics:    Todd [name], is a 65 y.o. male, DOB - June 15, 1955, SWH:[id number]  Admit date - 01/10/2021   Admitting Physician Deshannon Hinchliffe Denton Brick, MD  Outpatient Primary MD for the patient is [name], FNP  LOS - 1   Chief Complaint  Patient presents with   Weakness        Subjective:    Todd [name] today has no fevers, no emesis,  No chest pain,  -Denies abdominal pain -Refused to work with occupational therapist today  Assessment  & Plan :    Principal Problem:   Atrial fibrillation with RVR ([place]) Active Problems:   Hypomagnesemia   Iron deficiency anemia due to chronic blood loss   Cocaine abuse -on-going   Alcohol abuse, continuous   Diabetes mellitus type 2 with complications ([place])   Tobacco abuse   Hypothyroidism   Hypokalemia   Hypocalcemia   Brief Summary:- 65 year old with history of polysubstance abuse including alcohol tobacco and  almost daily cocaine use who is notoriously noncompliant with medications admitted on 01/10/2021 with concerns for generalized weakness and A. fib with RVR in the setting of severely abnormal thyroid and severe electrolyte derangement including hypokalemia, hypomagnesemia and hypocalcemia   A/p 1)Chronic A. fib with AVR---most likely related to severe electrolyte derangement including hypophosphatemia, hypocalcemia and severely abnormal thyroid -EKG with A. fib and heart rate up to 153 Magnesium 1.2 >> 2.4  --Calcium is 5.1 with an albumin of 1.6 so corrected calcium is around 7 -Potassium is 2.7 >> 3.9 -echocardiogram without significant aortic stenosis or other outflow obstruction, EF is 55 to 60%, and also without  segmental/Regional wall motion abnormalities. Hold Eliquis due to heme positive stool with drop in H&H -Continue metoprolol for rate control, if UDS is positive for cocaine will use Cardizem for rate  control instead - - 2) Severe hypothyroidism--patient is notoriously noncompliant, TSH  189.8 on 12/26/20  - repeat TSH is  Now 0.805 -Free T4 was 0.56 on 12/26/20 and Now 1.78-low at 0.5, will repeat -Decrease levothyroxine to 100 mcg from 150 -follow-up with endocrinologist Dr. Loni Beckwith in about 3 weeks for repeat TSH and free T4 advised   3)Acute on Chronic Anemia-- Hgb 7.8 >>6.7  baseline Hgb usually around 9 -Stop Eliquis Stool occult blood is positive -Continue Protonix -GI consult requested -B12 and folate not low -Serum iron is low at 30, TIBC 145 -Transfused 2 units of PRBC with Lasix in between    4)Severe hypophosphatemia, hypomagnesemia and  hypocalcemia and hypokalemia----severe electrolyte derangement and severely abnormal thyroid probably contributing to patient's arrhythmias -Patient recently had episodes of nonsustained V. Tach during recent hospitalization -Electrolytes as above #1, thyroid function as above #2 -No further arrhythmias at this time   5)DM2-A1c 6.0 reflecting excellent diabetic control PTA, but patient sugars are probably not elevated also because patient really does not eat much he drinks a lot of alcohol and just does not eat much ---Use Novolog/Humalog Sliding scale insulin with Accu-Cheks/Fingersticks as ordered   6)Polysubstance abuse + Etoh Abuse--patient uses alcohol, tobacco and cocaine almost on a daily basis -lorazepam per CIWA protocol for DT prophylaxis, thiamine and folic acid as ordered --Pt recently refused inpatient rehab for drugs and alcohol --Education officer, museum  was able to make arrangements for patient to hopefully go to [facility] as outpatient and possibly get admitted to [facility] in [place] for inpatient rehab -Please see social worker documentation dated 12/29/2020 from [facility]    7)Generalized weakness and deconditioning--- most likely related to severe electrolyte abnormalities including severe hypophosphatemia,  hypocalcemia and hypokalemia as well as severely abnormal thyroid -Replace electrolytes as above in #4 -Patient evaluated by physical therapist on 01/10/2021 recommends SNF rehab -CT head without contrast with evidence of old strokes, but no new acute infarcts -  8)Anorexia/FTT--- multifactorial, resulting in severe electrolyte abnormalities, -Megace for appetite stimulation as ordered -Okay to take multivitamin and nutritional supplements   9)Social/Ethics--patient technically lives alone in an apartment,  -Typically uses cocaine and alcohol and tobacco on a daily basis -Recently has had multiple hospitalizations to [facility] on [place] noncompliant -High risk for decompensation and death -Plan of care discussed with patient's sisters [name] and Scientist, physiological by phone -Patient is a full code   10)Leukocytosis--  -WBC down to 11 from 13.2 -Chest x-ray without pneumonia, -UA suspicious for possible UTI -IV Rocephin as ordered for possible UTI pending culture data   11)H/o old CVAs--- chronic lacunar infarcts within the bilateral basal Ganglia, Mild chronic small vessel ischemic changes within the cerebral white matter.  -chronic infarct in the right cerebellar hemisphere. ??? Dementia related to old CVAs and EToh Abuse   Disposition/Need for in-Hospital Stay- patient unable to be discharged at this time due to --severe electrolyte abnormalities and severely abnormal thyroid resulting in arrhythmias including A. Fib with RVR -requiring IV replacements and monitoring*   Status is: Inpatient   Remains inpatient appropriate because: Please see disposition above   Dispo: The patient is from: Home              Anticipated d/c is to: SNF              Anticipated d/c date is: 2 days              Patient currently is not medically stable to d/c. Barriers: Not Clinically Stable-    Code Status : -  Code Status: Full Code    Family Communication:    (patient  is alert, awake and coherent) -Plan of care discussed with patient's sisters  [name] and [name] and [name] Consults  :  na  Consults  :  Gi  DVT Prophylaxis  :   - SCDs       Lab Results  Component Value Date   PLT 385 01/11/2021    Inpatient Medications  Scheduled Meds:  sodium chloride   Intravenous Once   allopurinol  100 mg Oral Daily   atorvastatin  40 mg Oral q1800   calcium carbonate  1 tablet Oral BID WC   folic acid  1 mg Oral Daily   furosemide  40 mg Intravenous Once   Gerhardt's butt cream   Topical TID   insulin aspart  0-5 Units Subcutaneous QHS   insulin aspart  0-9 Units Subcutaneous TID WC   insulin aspart  3 Units Subcutaneous TID WC   levothyroxine  150 mcg Oral Q0600   megestrol  400 mg Oral BID   metoprolol succinate  25 mg Oral Daily   multivitamin with minerals  1 tablet Oral Daily   pantoprazole  40 mg Oral BID   phosphorus  250 mg Oral BID   polyethylene glycol  17 g Oral Daily  sertraline  50 mg Oral QHS   silver sulfADIAZINE  1 application Topical Daily   sodium bicarbonate  650 mg Oral TID   sodium chloride flush  3 mL Intravenous Q12H   sodium chloride flush  3 mL Intravenous Q12H   thiamine  100 mg Oral Daily   Continuous Infusions:  sodium chloride     cefTRIAXone (ROCEPHIN)  IV     dextrose 5 % and 0.45 % NaCl with KCl 20 mEq/L 75 mL/hr at 01/10/21 1651   PRN Meds:.sodium chloride, acetaminophen **OR** acetaminophen, bisacodyl, diltiazem, ondansetron **OR** ondansetron (ZOFRAN) IV, sodium chloride flush, traZODone    Anti-infectives (From admission, onward)    Start     Dose/Rate Route Frequency Ordered Stop   01/11/21 1145  cefTRIAXone (ROCEPHIN) 1 g in sodium chloride 0.9 % 100 mL IVPB        1 g 200 mL/hr over 30 Minutes Intravenous Every 24 hours 01/11/21 1053           Objective:   Vitals:   01/10/21 2002 01/11/21 0015 01/11/21 0533 01/11/21 1018  BP: 129/87 119/78 95/64 104/70  Pulse: 78 82 76 82  Resp: 18 18 20     Temp: (!) 97.3 F (36.3 C) 98.1 F (36.7 C) 97.9 F (36.6 C)   TempSrc:  Oral    SpO2: 99% 100% 100%   Weight:      Height:        Wt Readings from Last 3 Encounters:  01/10/21 59.1 kg  12/26/20 58.2 kg  12/05/20 55.7 kg     Intake/Output Summary (Last 24 hours) at 01/11/2021 1110 Last data filed at 01/11/2021 0900 Gross per 24 hour  Intake 3210 ml  Output 225 ml  Net 2985 ml     Physical Exam  Physical Examination: General appearance - alert, chronically ill appearing, and in no distress Mental status - alert, oriented to person, place, and time, , patient does have intermittent episodes of disorientation or confusion Eyes - sclera anicteric Neck - supple, no JVD elevation , Chest -somewhat diminished, no wheezing  heart - S1 and S2 normal, irregularly irregular  abdomen - soft, nontender, nondistended, no masses or organomegaly Neurological -generalized weakness, occasional episodes of disorientation and confusion skin - warm, dry MSK-bilateral AKA   Data Review:   Micro Results Recent Results (from the past 240 hour(s))  Resp Panel by RT-PCR (Flu A&B, Covid) Nasopharyngeal Swab     Status: None   Collection Time: 01/10/21  3:21 PM   Specimen: Nasopharyngeal Swab; Nasopharyngeal(NP) swabs in vial transport medium  Result Value Ref Range Status   SARS Coronavirus 2 by RT PCR NEGATIVE NEGATIVE Final    Comment: (NOTE) SARS-CoV-2 target nucleic acids are NOT DETECTED.  The SARS-CoV-2 RNA is generally detectable in upper respiratory specimens during the acute phase of infection. The lowest concentration of SARS-CoV-2 viral copies this assay can detect is 138 copies/mL. A negative result does not preclude SARS-Cov-2 infection and should not be used as the sole basis for treatment or other patient management decisions. A negative result may occur with  improper specimen collection/handling, submission of specimen other than nasopharyngeal swab, presence of viral  mutation(s) within the areas targeted by this assay, and inadequate number of viral copies(<138 copies/mL). A negative result must be combined with clinical observations, patient history, and epidemiological information. The expected result is Negative.  Fact Sheet for Patients:  EntrepreneurPulse.com.au  Fact Sheet for Healthcare Providers:  IncredibleEmployment.be  This test is no  t yet approved or cleared by the [place] and  has been authorized for detection and/or diagnosis of SARS-CoV-2 by FDA under an Emergency Use Authorization (EUA). This EUA will remain  in effect (meaning this test can be used) for the duration of the COVID-19 declaration under Section 564(b)(1) of the Act, 21 U.S.C.section 360bbb-3(b)(1), unless the authorization is terminated  or revoked sooner.       Influenza A by PCR NEGATIVE NEGATIVE Final   Influenza B by PCR NEGATIVE NEGATIVE Final    Comment: (NOTE) The Xpert Xpress SARS-CoV-2/FLU/RSV plus assay is intended as an aid in the diagnosis of influenza from Nasopharyngeal swab specimens and should not be used as a sole basis for treatment. Nasal washings and aspirates are unacceptable for Xpert Xpress SARS-CoV-2/FLU/RSV testing.  Fact Sheet for Patients: EntrepreneurPulse.com.au  Fact Sheet for Healthcare Providers: IncredibleEmployment.be  This test is not yet approved or cleared by the [place] FDA and has been authorized for detection and/or diagnosis of SARS-CoV-2 by FDA under an Emergency Use Authorization (EUA). This EUA will remain in effect (meaning this test can be used) for the duration of the COVID-19 declaration under Section 564(b)(1) of the Act, 21 U.S.C. section 360bbb-3(b)(1), unless the authorization is terminated or revoked.  Performed at [facility], [address] [place], [place] [address]     Radiology Reports CT HEAD WO  CONTRAST  Result Date: 01/10/2021 CLINICAL DATA:  Mental status change, unknown cause. Additional history provided: Generalized weakness. EXAM: CT HEAD WITHOUT CONTRAST TECHNIQUE: Contiguous axial images were obtained from the base of the skull through the vertex without intravenous contrast. COMPARISON:  Prior head CT examinations 12/21/2020 and earlier. FINDINGS: Brain: Mild-to-moderate generalized cerebral atrophy. Comparatively mild cerebellar atrophy. Redemonstrated chronic lacunar infarcts within the bilateral basal ganglia. Mild patchy and ill-defined hypoattenuation within the cerebral white matter, nonspecific but compatible with chronic small vessel ischemic disease. Redemonstrated chronic infarct within the right cerebellar hemisphere. There is no acute intracranial hemorrhage. No demarcated cortical infarct. No extra-axial fluid collection. No evidence of an intracranial mass. No midline shift. Vascular: No hyperdense vessel.  Atherosclerotic calcifications. Skull: Unchanged nonspecific sclerotic thickening of the outer table of the left parietal calvarium, benign in appearance. Sinuses/Orbits: Visualized orbits show no acute finding. No significant paranasal sinus disease. IMPRESSION: No evidence of acute intracranial abnormality. Redemonstrated chronic lacunar infarcts within the bilateral basal ganglia. Mild chronic small vessel ischemic changes within the cerebral white matter. Redemonstrated chronic infarct in the right cerebellar hemisphere. Electronically Signed   By: Kellie Simmering DO   On: 01/10/2021 17:40   DG CHEST PORT 1 VIEW  Result Date: 01/10/2021 CLINICAL DATA:  Nonproductive cough. Diabetes and hypertension. EXAM: PORTABLE CHEST 1 VIEW COMPARISON:  04/28/2021 FINDINGS: The heart size and mediastinal contours are within normal limits. New diffuse interstitial infiltrates are seen, suspicious for interstitial edema. No evidence of pulmonary airspace disease or pleural effusion.  Multiple surgical clips again seen in the area the thoracic inlet and lower neck. IMPRESSION: New diffuse interstitial infiltrates, suspicious for interstitial edema. Electronically Signed   By: Marlaine Hind M.D.   On: 01/10/2021 19:47   DG Chest Portable 1 View  Result Date: 12/26/2020 CLINICAL DATA:  Weakness and syncopal episode yesterday. EXAM: PORTABLE CHEST 1 VIEW COMPARISON:  November 21, 2020 FINDINGS: Tortuosity and calcific atherosclerotic disease of the aorta. Postsurgical changes at the thoracic inlet. Cardiomediastinal silhouette is normal. Mediastinal contours appear intact. There is no evidence of pleural effusion or pneumothorax. Streaky  airspace opacities in the right lung base. Osseous structures are without acute abnormality. Soft tissues are grossly normal. IMPRESSION: Streaky airspace opacities in the right lung base, which may represent atelectasis or airspace consolidation. Electronically Signed   By: Fidela Salisbury M.D.   On: 12/26/2020 16:13   ECHOCARDIOGRAM COMPLETE  Result Date: 12/27/2020    ECHOCARDIOGRAM REPORT   Patient Name:   COULTER OLDAKER Date of Exam: 12/27/2020 Medical Rec #:  [id number]       Height:       68.0 in Accession #:    [id number]      Weight:       128.3 lb Date of Birth:  [birth year]      BSA:          1.692 m Patient Age:    66 years        BP:           108/64 mmHg Patient Gender: M               HR:           71 bpm. Exam Location:  [facility] Procedure: 2D Echo, Cardiac Doppler, Color Doppler and Intracardiac            Opacification Agent Indications:    I48.0 Paroxysmal atrial fibrillation  History:        Patient has prior history of Echocardiogram examinations, most                 recent 02/13/2018. Risk Factors:Hypertension, Diabetes and                 Dyslipidemia. Thyroid Disease. Sepsis.  Sonographer:    Jonelle Sidle Dance Referring Phys: [id number] JACOB J STINSON  Sonographer Comments: Technically difficult study due to poor echo windows. Image acquisition  challenging due to respiratory motion. IMPRESSIONS  1. Left ventricular ejection fraction, by estimation, is 55 to 60%. The left ventricle has normal function. The left ventricle has no regional wall motion abnormalities. There is mild concentric left ventricular hypertrophy. Left ventricular diastolic parameters are consistent with Grade I diastolic dysfunction (impaired relaxation).  2. Right ventricular systolic function is normal. The right ventricular size is normal. Tricuspid regurgitation signal is inadequate for assessing PA pressure.  3. The mitral valve is grossly normal. No evidence of mitral valve regurgitation. No evidence of mitral stenosis.  4. The aortic valve is tricuspid. Aortic valve regurgitation is not visualized. No aortic stenosis is present.  5. The inferior vena cava is normal in size with greater than 50% respiratory variability, suggesting right atrial pressure of 3 mmHg. Comparison(s): No significant change from prior study. FINDINGS  Left Ventricle: Left ventricular ejection fraction, by estimation, is 55 to 60%. The left ventricle has normal function. The left ventricle has no regional wall motion abnormalities. Definity contrast agent was given IV to delineate the left ventricular  endocardial borders. The left ventricular internal cavity size was normal in size. There is mild concentric left ventricular hypertrophy. Left ventricular diastolic parameters are consistent with Grade I diastolic dysfunction (impaired relaxation). Right Ventricle: The right ventricular size is normal. No increase in right ventricular wall thickness. Right ventricular systolic function is normal. Tricuspid regurgitation signal is inadequate for assessing PA pressure. Left Atrium: Left atrial size was normal in size. Right Atrium: Right atrial size was normal in size. Pericardium: Trivial pericardial effusion is present. Presence of pericardial fat pad. Mitral Valve: The mitral valve is grossly normal. No  evidence of  mitral valve regurgitation. No evidence of mitral valve stenosis. Tricuspid Valve: The tricuspid valve is grossly normal. Tricuspid valve regurgitation is trivial. No evidence of tricuspid stenosis. Aortic Valve: The aortic valve is tricuspid. Aortic valve regurgitation is not visualized. No aortic stenosis is present. Pulmonic Valve: The pulmonic valve was not well visualized. Pulmonic valve regurgitation is not visualized. Aorta: The aortic root and ascending aorta are structurally normal, with no evidence of dilitation. Venous: The inferior vena cava is normal in size with greater than 50% respiratory variability, suggesting right atrial pressure of 3 mmHg. IAS/Shunts: The atrial septum is grossly normal.  LEFT VENTRICLE PLAX 2D LVIDd:         4.02 cm  Diastology LVIDs:         2.98 cm  LV e' medial:   4.64 cm/s LV PW:         1.40 cm  LV E/e' medial: 8.4 LV IVS:        1.31 cm LVOT diam:     2.00 cm LV SV:         30 LV SV Index:   17 LVOT Area:     3.14 cm  RIGHT VENTRICLE RV Basal diam:  2.52 cm TAPSE (M-mode): 1.9 cm LEFT ATRIUM             Index       RIGHT ATRIUM           Index LA diam:        3.10 cm 1.83 cm/m  RA Area:     14.80 cm LA Vol (A2C):   59.7 ml 35.28 ml/m RA Volume:   36.70 ml  21.69 ml/m LA Vol (A4C):   37.6 ml 22.22 ml/m LA Biplane Vol: 47.3 ml 27.95 ml/m  AORTIC VALVE LVOT Vmax:   48.80 cm/s LVOT Vmean:  33.600 cm/s LVOT VTI:    0.094 m  AORTA Ao Root diam: 3.30 cm Ao Asc diam:  3.20 cm MITRAL VALVE MV Area (PHT): 2.48 cm    SHUNTS MV Decel Time: 306 msec    Systemic VTI:  0.09 m MV E velocity: 38.80 cm/s  Systemic Diam: 2.00 cm MV A velocity: 42.70 cm/s MV E/A ratio:  0.91 Eleonore Chiquito MD Electronically signed by Eleonore Chiquito MD Signature Date/Time: 12/27/2020/3:39:42 PM    Final      CBC Recent Labs  Lab 01/10/21 0847 01/11/21 0509  WBC 13.2* 11.0*  HGB 7.8* 6.7*  HCT 24.3* 21.0*  PLT 406* 385  MCV 82.7 84.3  MCH 26.5 26.9  MCHC 32.1 31.9  RDW 16.5*  16.6*  LYMPHSABS 2.8  --   MONOABS 0.7  --   EOSABS 0.3  --   BASOSABS 0.1  --     Chemistries  Recent Labs  Lab 01/10/21 0847 01/10/21 0855 01/11/21 0509  NA 141  --  137  K 2.7*  --  3.9  CL 109  --  112*  CO2 24  --  18*  GLUCOSE 94  --  240*  BUN 13  --  19  CREATININE 0.93  --  1.23  CALCIUM 5.2*  --  5.1*  MG  --  1.2* 2.4  AST 15  --   --   ALT 9  --   --   ALKPHOS 86  --   --   BILITOT 0.8  --   --    ------------------------------------------------------------------------------------------------------------------ No results for input(s): CHOL, HDL, LDLCALC, TRIG, CHOLHDL, LDLDIRECT in the  last 72 hours.  Lab Results  Component Value Date   HGBA1C 6.0 (H) 12/04/2020   ------------------------------------------------------------------------------------------------------------------ Recent Labs    01/11/21 0509  TSH 0.805   ------------------------------------------------------------------------------------------------------------------ Recent Labs    01/11/21 0509  VITAMINB12 395  FOLATE 29.1  FERRITIN 352*  TIBC 145*  IRON 30*    Coagulation profile No results for input(s): INR, PROTIME in the last 168 hours.  No results for input(s): DDIMER in the last 72 hours.  Cardiac Enzymes No results for input(s): CKMB, TROPONINI, MYOGLOBIN in the last 168 hours.  Invalid input(s): CK ------------------------------------------------------------------------------------------------------------------    Component Value Date/Time   BNP 118.6 (H) 08/03/2016 1119   BNP 37.0 07/03/2016 2143     Rye Dorado M.D on 01/11/2021 at 11:10 AM  Go to [email or web address] - for contact info  [facility] - Office  [phone]

## 2021-01-11 NOTE — NC FL2 (Signed)
[place] LEVEL OF CARE SCREENING TOOL     IDENTIFICATION  Patient Name: Todd [name] Birthdate: [birth year] Sex: male Admission Date (Current Location): 01/10/2021  [facility] and [place] Number:  [employer] and Address:  [place] [address] [place]      Provider Number: [id number]  Attending Physician Name and Address:  Roxan Hockey, MD  Relative Name and Phone Number:       Current Level of Care: Hospital Recommended Level of Care: [place] Prior Approval Number:    Date Approved/Denied:   PASRR Number: [id number]  Discharge Plan: SNF    Current Diagnoses: Patient Active Problem List   Diagnosis Date Noted   Iron deficiency anemia due to chronic blood loss 01/11/2021   Atrial fibrillation with RVR ([place]) 01/10/2021   Hypomagnesemia 01/10/2021   Severe Hypophosphatemia 12/28/2020   Non-Sustained V-tach in the setting of Severe Electrolyte Derangement 12/28/2020   Cocaine abuse -on-going 12/28/2020   Alcohol abuse, continuous 12/28/2020   Syncope and collapse-suspect arrhythmia related in the setting of severe electrolyte abnormalities 12/28/2020   Diabetes mellitus type 2 with complications ([place]) 74/94/4967   Tobacco abuse 12/28/2020   Atrial fibrillation ([place]) 12/26/2020   Hypocalcemia 59/16/3846   Acute metabolic encephalopathy 65/99/3570   Hypoglycemia 12/05/2020   AKI (acute kidney injury) ([place]) 12/04/2020   Vitamin D deficiency 06/17/2019   Essential hypertension, benign 08/30/2018   Mixed hyperlipidemia 07/17/2018   Hyponatremia 02/14/2018   Hypokalemia 02/14/2018   Medically noncompliant 02/14/2018   S/P AKA (above knee amputation) bilateral ([place]) 02/12/2018   Current smoker 02/03/2018   Leukocytosis 02/03/2018   Reactive thrombocytosis 02/03/2018   Urinary incontinence 08/14/2017   Transaminitis    Hyperuricemia 06/21/2016   HTN (hypertension) 01/05/2016   Diabetes  type 2, uncontrolled ([place]) 01/05/2016   Hypothyroidism 05/10/2011    Orientation RESPIRATION BLADDER Height & Weight     Self, Time, Situation, Place  Normal Incontinent Weight: 130 lb 4.7 oz (59.1 kg) Height:  5\' 8"  (172.7 cm) (prior to BKA)  BEHAVIORAL SYMPTOMS/MOOD NEUROLOGICAL BOWEL NUTRITION STATUS      Continent Diet (see dc summary)  AMBULATORY STATUS COMMUNICATION OF NEEDS Skin   Extensive Assist Verbally PU Stage and Appropriate Care   PU Stage 2 Dressing:  (Coccyx)                   Personal Care Assistance Level of Assistance  Bathing, Feeding, Dressing Bathing Assistance: Limited assistance Feeding assistance: Independent Dressing Assistance: Limited assistance     Functional Limitations Info  Sight, Hearing, Speech Sight Info: Adequate Hearing Info: Adequate Speech Info: Adequate    SPECIAL CARE FACTORS FREQUENCY  PT (By licensed PT), OT (By licensed OT)     PT Frequency: 5x week OT Frequency: 3x week            Contractures Contractures Info: Not present    Additional Factors Info  Code Status, Allergies Code Status Info: Full Allergies Info: Lisinopril           Current Medications (01/11/2021):  This is the current hospital active medication list Current Facility-Administered Medications  Medication Dose Route Frequency Provider Last Rate Last Admin   0.9 %  sodium chloride infusion (Manually program via Guardrails IV Fluids)   Intravenous Once Emokpae, Courage, MD       0.9 %  sodium chloride infusion  250 mL Intravenous PRN Roxan Hockey, MD       acetaminophen (  TYLENOL) tablet 650 mg  650 mg Oral Q6H PRN Emokpae, Courage, MD       Or   acetaminophen (TYLENOL) suppository 650 mg  650 mg Rectal Q6H PRN Emokpae, Courage, MD       allopurinol (ZYLOPRIM) tablet 100 mg  100 mg Oral Daily Emokpae, Courage, MD   100 mg at 01/11/21 1011   atorvastatin (LIPITOR) tablet 40 mg  40 mg Oral q1800 Emokpae, Courage, MD   40 mg at 01/10/21 1833    bisacodyl (DULCOLAX) suppository 10 mg  10 mg Rectal Daily PRN Roxan Hockey, MD       calcium carbonate (TUMS - dosed in mg elemental calcium) chewable tablet 200 mg of elemental calcium  1 tablet Oral BID WC Emokpae, Courage, MD   200 mg of elemental calcium at 01/11/21 1011   cefTRIAXone (ROCEPHIN) 1 g in sodium chloride 0.9 % 100 mL IVPB  1 g Intravenous Q24H Emokpae, Courage, MD 200 mL/hr at 01/11/21 1215 1 g at 01/11/21 1215   dextrose 5 % and 0.45 % NaCl with KCl 20 mEq/L infusion   Intravenous Continuous D[place] Brick, Courage, MD 50 mL/hr at 01/11/21 1207 Rate Change at 01/11/21 1207   diltiazem (CARDIZEM) tablet 30 mg  30 mg Oral Q6H PRN Roxan Hockey, MD       folic acid (FOLVITE) tablet 1 mg  1 mg Oral Daily Emokpae, Courage, MD   1 mg at 01/11/21 1011   furosemide (LASIX) injection 40 mg  40 mg Intravenous Once Emokpae, Courage, MD       Gerhardt's butt cream   Topical TID Emokpae, Courage, MD       insulin aspart (novoLOG) injection 0-5 Units  0-5 Units Subcutaneous QHS Emokpae, Courage, MD       insulin aspart (novoLOG) injection 0-9 Units  0-9 Units Subcutaneous TID WC Emokpae, Courage, MD   1 Units at 01/11/21 1256   insulin aspart (novoLOG) injection 3 Units  3 Units Subcutaneous TID WC Emokpae, Courage, MD   3 Units at 01/11/21 1256   levothyroxine (SYNTHROID) tablet 150 mcg  150 mcg Oral Q0600 Roxan Hockey, MD   150 mcg at 01/11/21 5573   megestrol (MEGACE) 400 MG/10ML suspension 400 mg  400 mg Oral BID Denton Brick, Courage, MD   400 mg at 01/11/21 1012   metoprolol succinate (TOPROL-XL) 24 hr tablet 25 mg  25 mg Oral Daily Emokpae, Courage, MD   25 mg at 01/11/21 1019   multivitamin with minerals tablet 1 tablet  1 tablet Oral Daily Emokpae, Courage, MD   1 tablet at 01/11/21 1012   ondansetron (ZOFRAN) tablet 4 mg  4 mg Oral Q6H PRN Emokpae, Courage, MD       Or   ondansetron (ZOFRAN) injection 4 mg  4 mg Intravenous Q6H PRN Emokpae, Courage, MD       pantoprazole (PROTONIX)  EC tablet 40 mg  40 mg Oral BID Emokpae, Courage, MD   40 mg at 01/11/21 1012   phosphorus (K PHOS NEUTRAL) tablet 250 mg  250 mg Oral BID Emokpae, Courage, MD   250 mg at 01/11/21 1011   polyethylene glycol (MIRALAX / GLYCOLAX) packet 17 g  17 g Oral Daily Emokpae, Courage, MD   17 g at 01/11/21 1210   sertraline (ZOLOFT) tablet 50 mg  50 mg Oral QHS Emokpae, Courage, MD   50 mg at 01/10/21 2148   silver sulfADIAZINE (SILVADENE) 1 % cream 1 application  1 application Topical Daily Emokpae, Courage,  MD   1 application at 95/63/87 1000   sodium bicarbonate tablet 650 mg  650 mg Oral TID Roxan Hockey, MD   650 mg at 01/11/21 1011   sodium chloride flush (NS) 0.9 % injection 3 mL  3 mL Intravenous Q12H Emokpae, Courage, MD       sodium chloride flush (NS) 0.9 % injection 3 mL  3 mL Intravenous Q12H Emokpae, Courage, MD       sodium chloride flush (NS) 0.9 % injection 3 mL  3 mL Intravenous PRN Emokpae, Courage, MD       thiamine tablet 100 mg  100 mg Oral Daily Emokpae, Courage, MD   100 mg at 01/11/21 1012   traZODone (DESYREL) tablet 50 mg  50 mg Oral QHS PRN Roxan Hockey, MD   50 mg at 01/10/21 2150     Discharge Medications: Please see discharge summary for a list of discharge medications.  Relevant Imaging Results:  Relevant Lab Results:   Additional Information SSN: [id number] LCSW

## 2021-01-11 NOTE — TOC Initial Note (Signed)
Transition of Care [facility]) - Initial/Assessment Note    Patient Details  Name: Todd [name] MRN: [id number] Date of Birth: [birth year]  Transition of Care [facility]) CM/SW Contact:    Shade Flood, LCSW Phone Number: 01/11/2021, 1:27 PM  Clinical Narrative:                   Pt admitted from home. Pt known to this LCSW from previous admission. PT/OT recommending SNF rehab at dc. Met with pt today to review dc planning. Pt agreeable to SNF referrals. Discussed CMS provider options and will refer as requested. Pt will need insurance auth before he can dc. TOC will initiate this auth.  Will follow.  Expected Discharge Plan: Skilled Nursing Facility Barriers to Discharge: Continued Medical Work up   Patient Goals and CMS Choice Patient states their goals for this hospitalization and ongoing recovery are:: SNF CMS Medicare.gov Compare Post Acute Care list provided to:: Patient Choice offered to / list presented to : Patient  Expected Discharge Plan and Services Expected Discharge Plan: [place] In-house Referral: Clinical Social Work   Post Acute Care Choice: [place] Living arrangements for the past 2 months: Apartment                                      Prior Living Arrangements/Services Living arrangements for the past 2 months: Apartment Lives with:: Self Patient language and need for interpreter reviewed:: Yes Do you feel safe going back to the place where you live?: Yes      Need for Family Participation in Patient Care: Yes (Comment) Care giver support system in place?: No (comment) Current home services: DME Criminal Activity/Legal Involvement Pertinent to Current Situation/Hospitalization: No - Comment as needed  Activities of Daily Living Home Assistive Devices/Equipment: Hospital bed, Trapeze, Wheelchair ADL Screening (condition at time of admission) Patient's cognitive ability adequate to safely complete daily activities?:  Yes Is the patient deaf or have difficulty hearing?: No Does the patient have difficulty seeing, even when wearing glasses/contacts?: No Does the patient have difficulty concentrating, remembering, or making decisions?: No Patient able to express need for assistance with ADLs?: Yes Does the patient have difficulty dressing or bathing?: No Independently performs ADLs?: Yes (appropriate for developmental age) Does the patient have difficulty walking or climbing stairs?: Yes Weakness of Legs: Both Weakness of Arms/Hands: None  Permission Sought/Granted Permission sought to share information with : Facility Art therapist granted to share information with : Yes, Verbal Permission Granted     Permission granted to share info w AGENCY: snfs        Emotional Assessment Appearance:: Appears stated age Attitude/Demeanor/Rapport: Engaged Affect (typically observed): Pleasant Orientation: : Oriented to Self, Oriented to Place, Oriented to  Time, Oriented to Situation Alcohol / Substance Use: Not Applicable Psych Involvement: No (comment)  Admission diagnosis:  Hypocalcemia [E83.51] Weakness [R53.1] Persistent atrial fibrillation (HCC) [I48.19] Atrial fibrillation with RVR ([place]) [I48.91] Patient Active Problem List   Diagnosis Date Noted   Iron deficiency anemia due to chronic blood loss 01/11/2021   Atrial fibrillation with RVR ([place]) 01/10/2021   Hypomagnesemia 01/10/2021   Severe Hypophosphatemia 12/28/2020   Non-Sustained V-tach in the setting of Severe Electrolyte Derangement 12/28/2020   Cocaine abuse -on-going 12/28/2020   Alcohol abuse, continuous 12/28/2020   Syncope and collapse-suspect arrhythmia related in the setting of severe electrolyte abnormalities 12/28/2020   Diabetes mellitus type  2 with complications ([place]) 79/43/2761   Tobacco abuse 12/28/2020   Atrial fibrillation ([place]) 12/26/2020   Hypocalcemia 47/02/2956   Acute metabolic encephalopathy  47/34/0370   Hypoglycemia 12/05/2020   AKI (acute kidney injury) ([place]) 12/04/2020   Vitamin D deficiency 06/17/2019   Essential hypertension, benign 08/30/2018   Mixed hyperlipidemia 07/17/2018   Hyponatremia 02/14/2018   Hypokalemia 02/14/2018   Medically noncompliant 02/14/2018   S/P AKA (above knee amputation) bilateral ([place]) 02/12/2018   Current smoker 02/03/2018   Leukocytosis 02/03/2018   Reactive thrombocytosis 02/03/2018   Urinary incontinence 08/14/2017   Transaminitis    Hyperuricemia 06/21/2016   HTN (hypertension) 01/05/2016   Diabetes type 2, uncontrolled ([place]) 01/05/2016   Hypothyroidism 05/10/2011   PCP:  [name], FNP Pharmacy:   [facility], [place] [address] Phone: [phone] Fax: [phone]  [facility], [place] [address] Phone: [phone] Fax: [phone]  [facility] [address], [place] [address] [place] [address] Phone: [phone] Fax: [phone]  [id number] [id number]: [phone]: [phone]    Social Determinants of Health (SDOH) Interventions    Readmission Risk Interventions No flowsheet data found.

## 2021-01-11 NOTE — Consult Note (Signed)
Consultation Note Date: 01/11/2021   Patient Name: Todd [name]  DOB: 04/27/1956  MRN: [id number]  Age / Sex: 65 y.o., male  PCP: [name], FNP Referring Physician: Roxan Hockey, MD  Reason for Consultation: Establishing goals of care and Psychosocial/spiritual support  HPI/Patient Profile: 65 y.o. male  with past medical history of bilateral AKA, DM, HTN/HLD, polysubstance abuse including alcohol/tobacco/almost daily cocaine use, medical noncompliance, AKI, goiter, gout, admitted on 01/10/2021 with chronic A. fib with a VR most likely related to electrolyte derangement.   Clinical Assessment and Goals of Care: I have reviewed medical records including EPIC notes, labs and imaging, received report from RN, assessed the patient.  Mr. Clack is lying quietly in bed.  He greets me making and somewhat keeping eye contact.  He appears chronically ill and somewhat frail.  He is alert, oriented to person, but not place stating that we are in a nursing home.  I believe that he is able to make his basic needs known.  There is no family at bedside at this time.    We talked about healthcare power of attorney, Mr. Fann names his sister, Dolores Frame as his healthcare surrogate.  Call to sister to discuss diagnosis prognosis, [place], EOL wishes, disposition and options, no answer, unable to leave voicemail message.  Advanced directives, concepts specific to code status, artifical feeding and hydration, and rehospitalization were  UNABLE to be discussed today due to patient mental status.  Would benefit from outpatient palliative Care services, but unable to discuss with patient today.  Conference with attending and transition of care team related to patient condition, goals, disposition. PMT to continue to follow.    HCPOA   NEXT OF KIN -Mr. Vassey names his sister, Dolores Frame as his healthcare  surrogate.    SUMMARY OF RECOMMENDATIONS   At this point full scope/full code Would benefit from outpatient palliative Agreeable to short-term rehab if can find placement.   Code Status/Advance Care Planning: Full code -unable to have [place] discussions with patient due to confusion.  Symptom Management:  Per hospitalist, no additional needs at this time.  Palliative Prophylaxis:  Oral Care and Turn Reposition  Additional Recommendations (Limitations, Scope, Preferences): Full Scope Treatment  Psycho-social/Spiritual:  Desire for further Chaplaincy support:no Additional Recommendations: Caregiving  Support/Resources  Prognosis:  Unable to determine, based on outcomes.  6 months or less would not be surprising based on poor functional status, polysubstance abuse, 3 hospital stays in the last 6 months.  Discharge Planning:  To be determined, based on outcomes, qualifications, ability for placement.       Primary Diagnoses: Present on Admission:  Atrial fibrillation with RVR ([place])  Diabetes mellitus type 2 with complications (HCC)  Hypocalcemia  Hypothyroidism  Tobacco abuse  Alcohol abuse, continuous  Cocaine abuse -on-going  Hypomagnesemia  Hypokalemia  Iron deficiency anemia due to chronic blood loss   I have reviewed the medical record, interviewed the patient and family, and examined the patient. The following aspects are pertinent.  Past Medical History:  Diagnosis Date   AKI (acute kidney injury) ([place])    Constipated    Diabetes mellitus without complication (HCC)    Diarrhea    Elevated LFTs    Goiter    Gout    Hyperlipidemia    Hypertension    Leukocytosis    Reactive thrombocytosis    Right BKA infection ([place]) 08/2016   Right leg pain    Sepsis due to undetermined organism [facility])    Thyroid disease    Wound infection after surgery 08/2016   Social History   Socioeconomic History   Marital status: Single    Spouse name: Not on file    Number of children: Not on file   Years of education: Not on file   Highest education level: Not on file  Occupational History   Occupation: retired    Comment: drove a Medical illustrator  Tobacco Use   Smoking status: Every Day    Packs/day: 0.25    Years: 45.00    Pack years: 11.25    Types: Cigarettes    Last attempt to quit: 11/12/2014    Years since quitting: 6.1   Smokeless tobacco: Never  Vaping Use   Vaping Use: Never used  Substance and Sexual Activity   Alcohol use: Yes    Alcohol/week: 2.0 standard drinks    Types: 1 Glasses of wine, 1 Cans of beer per week   Drug use: No   Sexual activity: Not Currently  Other Topics Concern   Not on file  Social History Narrative   09/23/20 - Lives alone, uses a wheelchair, double amputee, not married, no children - HHA comes in 3x per week to help him bathe and set up weekly meds.   Social Determinants of Health   Financial Resource Strain: Low Risk    Difficulty of Paying Living Expenses: Not very hard  Food Insecurity: No Food Insecurity   Worried About Charity fundraiser in the Last Year: Never true   Ran Out of Food in the Last Year: Never true  Transportation Needs: No Transportation Needs   Lack of Transportation (Medical): No   Lack of Transportation (Non-Medical): No  Physical Activity: Inactive   Days of Exercise per Week: 0 days   Minutes of Exercise per Session: 0 min  Stress: No Stress Concern Present   Feeling of Stress : Only a little  Social Connections: Socially Isolated   Frequency of Communication with Friends and Family: More than three times a week   Frequency of Social Gatherings with Friends and Family: More than three times a week   Attends Religious Services: Never   Marine scientist or Organizations: No   Attends Music therapist: Never   Marital Status: Never married   Family History  Problem Relation Age of Onset   Heart disease Mother    Pneumonia Father    Diabetes Maternal Aunt     Diabetes Maternal Uncle    Scheduled Meds:  sodium chloride   Intravenous Once   allopurinol  100 mg Oral Daily   atorvastatin  40 mg Oral q1800   calcium carbonate  1 tablet Oral BID WC   folic acid  1 mg Oral Daily   furosemide  40 mg Intravenous Once   Gerhardt's butt cream   Topical TID   insulin aspart  0-5 Units Subcutaneous QHS   insulin aspart  0-9 Units Subcutaneous TID WC   insulin aspart  3 Units  Subcutaneous TID WC   levothyroxine  150 mcg Oral Q0600   megestrol  400 mg Oral BID   metoprolol succinate  25 mg Oral Daily   multivitamin with minerals  1 tablet Oral Daily   pantoprazole  40 mg Oral BID   phosphorus  250 mg Oral BID   polyethylene glycol  17 g Oral Daily   sertraline  50 mg Oral QHS   silver sulfADIAZINE  1 application Topical Daily   sodium bicarbonate  650 mg Oral TID   sodium chloride flush  3 mL Intravenous Q12H   sodium chloride flush  3 mL Intravenous Q12H   thiamine  100 mg Oral Daily   Continuous Infusions:  sodium chloride     cefTRIAXone (ROCEPHIN)  IV 1 g (01/11/21 1215)   dextrose 5 % and 0.45 % NaCl with KCl 20 mEq/L 50 mL/hr at 01/11/21 1207   PRN Meds:.sodium chloride, acetaminophen **OR** acetaminophen, bisacodyl, diltiazem, ondansetron **OR** ondansetron (ZOFRAN) IV, sodium chloride flush, traZODone Medications Prior to Admission:  Prior to Admission medications   Medication Sig Start Date End Date Taking? Authorizing Provider  allopurinol (ZYLOPRIM) 100 MG tablet Take 1 tablet (100 mg total) by mouth daily. 12/06/20  Yes Johnson, Clanford L, MD  apixaban (ELIQUIS) 5 MG TABS tablet Take 1 tablet (5 mg total) by mouth 2 (two) times daily. 12/29/20  Yes Emokpae, Courage, MD  atorvastatin (LIPITOR) 40 MG tablet TAKE 1 TABLET BY MOUTH ONCE DAILY AT 6 PM 12/06/20  Yes Johnson, Clanford L, MD  blood glucose meter kit and supplies Dispense based on patient and insurance preference. Use up to four times daily as directed. (FOR ICD-10 E10.9,  E11.9). 12/06/20  Yes Johnson, Clanford L, MD  calcium carbonate (TUMS - DOSED IN MG ELEMENTAL CALCIUM) 500 MG chewable tablet Chew 1 tablet (200 mg of elemental calcium total) by mouth 2 (two) times daily with a meal. 12/29/20  Yes Emokpae, Courage, MD  cetirizine (ZYRTEC) 10 MG tablet Take 1 tablet (10 mg total) by mouth daily as needed for allergies. For allergy symptoms 12/06/20  Yes Johnson, Clanford L, MD  folic acid (FOLVITE) 1 MG tablet Take 1 tablet (1 mg total) by mouth daily. 12/29/20  Yes Emokpae, Courage, MD  glucose blood (ACCU-CHEK AVIVA PLUS) test strip Check blood sugars four times a day 03/12/18  Yes [name], [name], MD  levothyroxine (SYNTHROID) 150 MCG tablet Take 1 tablet (150 mcg total) by mouth daily. 12/29/20 12/29/21 Yes Emokpae, Courage, MD  megestrol (MEGACE) 400 MG/10ML suspension Take 10 mLs (400 mg total) by mouth 2 (two) times daily. For appetite stimulation 12/06/20  Yes Johnson, Clanford L, MD  metoprolol succinate (TOPROL XL) 25 MG 24 hr tablet Take 1 tablet (25 mg total) by mouth daily. 12/29/20 12/29/21 Yes Roxan Hockey, MD  phosphorus (K PHOS NEUTRAL) 155-852-130 MG tablet Take 1 tablet (250 mg total) by mouth 2 (two) times daily. 12/29/20  Yes Emokpae, Courage, MD  sertraline (ZOLOFT) 50 MG tablet Take 1 tablet (50 mg total) by mouth at bedtime. 12/06/20  Yes Johnson, Clanford L, MD  silver sulfADIAZINE (SILVADENE) 1 % cream Apply 1 application topically daily. Apply to buttock   Yes [provider]  sodium bicarbonate 650 MG tablet Take 1 tablet (650 mg total) by mouth 3 (three) times daily. 12/29/20  Yes Emokpae, Courage, MD  thiamine 100 MG tablet Take 1 tablet (100 mg total) by mouth daily. 12/29/20  Yes Roxan Hockey, MD   Allergies  Allergen  Reactions   Lisinopril Other (See Comments)    Hyperkalemia / Renal failure   Review of Systems  Unable to perform ROS: Acuity of condition   Physical Exam Vitals and nursing note reviewed.  Constitutional:       General: He is not in acute distress.    Appearance: He is ill-appearing.  Cardiovascular:     Rate and Rhythm: Normal rate.  Pulmonary:     Effort: Pulmonary effort is normal. No respiratory distress.  Musculoskeletal:     Comments: BL AKA  Skin:    General: Skin is warm and dry.  Neurological:     Mental Status: He is alert.     Comments: Oriented to person only  Psychiatric:        Mood and Affect: Mood normal.        Behavior: Behavior normal.     Comments: Calm and cooperative, not fearful    Vital Signs: BP 104/70   Pulse 82   Temp 97.9 F (36.6 C)   Resp 20   Ht 5' 8"  (1.727 m) Comment: prior to BKA  Wt 59.1 kg   SpO2 100%   BMI 19.81 kg/m  Pain Scale: 0-10   Pain Score: 0-No pain   SpO2: SpO2: 100 % O2 Device:SpO2: 100 % O2 Flow Rate: .   IO: Intake/output summary:  Intake/Output Summary (Last 24 hours) at 01/11/2021 1326 Last data filed at 01/11/2021 1100 Gross per 24 hour  Intake 2300 ml  Output 225 ml  Net 2075 ml    LBM: Last BM Date: 01/10/21 Baseline Weight: Weight: 58.2 kg Most recent weight: Weight: 59.1 kg     Palliative Assessment/Data:   Flowsheet Rows    Flowsheet Row Most Recent Value  Intake Tab   Referral Department Hospitalist  Unit at Time of Referral Cardiac/Telemetry Unit  Palliative Care Primary Diagnosis Cardiac  Date Notified 01/10/21  Palliative Care Type New Palliative care  Reason for referral Clarify Goals of Care  Date of Admission 01/10/21  Date first seen by Palliative Care 01/11/21  # of days Palliative referral response time 1 Day(s)  # of days IP prior to Palliative referral 0  Clinical Assessment   Palliative Performance Scale Score 50%  Pain Max last 24 hours Not able to report  Pain Min Last 24 hours Not able to report  Dyspnea Max Last 24 Hours Not able to report  Dyspnea Min Last 24 hours Not able to report  Psychosocial & Spiritual Assessment   Palliative Care Outcomes        Time In: 1120 Time  Out: 1210  Time Total: 50 minutes  Greater than 50%  of this time was spent counseling and coordinating care related to the above assessment and plan.  Signed by: Drue Novel, NP   Please contact Palliative Medicine Team phone at [phone] for questions and concerns.  For individual provider: See Shea Evans

## 2021-01-12 LAB — CBC
HCT: 21.4 % — ABNORMAL LOW (ref 39.0–52.0)
Hemoglobin: 6.6 g/dL — CL (ref 13.0–17.0)
MCH: 26.2 pg (ref 26.0–34.0)
MCHC: 30.8 g/dL (ref 30.0–36.0)
MCV: 84.9 fL (ref 80.0–100.0)
Platelets: 424 10*3/uL — ABNORMAL HIGH (ref 150–400)
RBC: 2.52 MIL/uL — ABNORMAL LOW (ref 4.22–5.81)
RDW: 16.4 % — ABNORMAL HIGH (ref 11.5–15.5)
WBC: 12.5 10*3/uL — ABNORMAL HIGH (ref 4.0–10.5)
nRBC: 0 % (ref 0.0–0.2)

## 2021-01-12 LAB — RENAL FUNCTION PANEL
Albumin: 1.6 g/dL — ABNORMAL LOW (ref 3.5–5.0)
Anion gap: 7 (ref 5–15)
BUN: 16 mg/dL (ref 8–23)
CO2: 22 mmol/L (ref 22–32)
Calcium: 5.2 mg/dL — CL (ref 8.9–10.3)
Chloride: 114 mmol/L — ABNORMAL HIGH (ref 98–111)
Creatinine, Ser: 1.22 mg/dL (ref 0.61–1.24)
GFR, Estimated: >60 mL/min (ref >60)
Glucose, Bld: 122 mg/dL — ABNORMAL HIGH (ref 70–99)
Phosphorus: 4.2 mg/dL (ref 2.5–4.6)
Potassium: 3.7 mmol/L (ref 3.5–5.1)
Sodium: 143 mmol/L (ref 135–145)

## 2021-01-12 LAB — GLUCOSE, CAPILLARY
Glucose-Capillary: 178 mg/dL — ABNORMAL HIGH (ref 70–99)
Glucose-Capillary: 50 mg/dL — ABNORMAL LOW (ref 70–99)
Glucose-Capillary: 64 mg/dL — ABNORMAL LOW (ref 70–99)
Glucose-Capillary: 186 mg/dL — ABNORMAL HIGH (ref 70–99)
Glucose-Capillary: 114 mg/dL — ABNORMAL HIGH (ref 70–99)

## 2021-01-12 MED ORDER — POTASSIUM CHLORIDE CRYS ER 20 MEQ PO TBCR
40.0000 meq | EXTENDED_RELEASE_TABLET | ORAL | Status: AC
  Administered 2021-01-12 (×2): 40 meq via ORAL
  Filled 2021-01-12 (×2): qty 2

## 2021-01-12 MED ORDER — CALCIUM GLUCONATE-NACL 1-0.675 GM/50ML-% IV SOLN
1.0000 g | Freq: Once | INTRAVENOUS | Status: AC
  Administered 2021-01-12: 1000 mg via INTRAVENOUS
  Filled 2021-01-12: qty 50

## 2021-01-12 MED ORDER — CALCIUM CARBONATE-VITAMIN D 500-200 MG-UNIT PO TABS
2.0000 | ORAL_TABLET | Freq: Two times a day (BID) | ORAL | Status: DC
  Administered 2021-01-12 – 2021-01-15 (×6): 2 via ORAL
  Filled 2021-01-12 (×6): qty 2

## 2021-01-12 MED ORDER — PEG 3350-KCL-NA BICARB-NACL 420 G PO SOLR
4000.0000 mL | Freq: Once | ORAL | Status: AC
  Administered 2021-01-12: 4000 mL via ORAL

## 2021-01-12 NOTE — Progress Notes (Signed)
Inpatient Diabetes Program Recommendations  AACE/ADA: New Consensus Statement on Inpatient Glycemic Control (2015)  Target Ranges:  Prepandial:   less than 140 mg/dL      Peak postprandial:   less than 180 mg/dL (1-2 hours)      Critically ill patients:  140 - 180 mg/dL   Lab Results  Component Value Date   GLUCAP 50 (L) 01/12/2021   HGBA1C 6.0 (H) 12/04/2020    Review of Glycemic Control Results for Todd [name], Todd [name] (MRN [id number]) as of 01/12/2021 11:06  Ref. Range 01/11/2021 11:56 01/11/2021 16:22 01/11/2021 22:36 01/12/2021 07:28 01/12/2021 10:48  Glucose-Capillary Latest Ref Range: 70 - 99 mg/dL 139 (H) 180 (H) 291 (H) 178 (H)  Novolog 5 units given at 9:15 am 50 (L)   Diabetes history: DM 2 Outpatient Diabetes medications: none Current orders for Inpatient glycemic control:  Novolog 0-9 units tid + hs Novolog 3 units tid meal coverage  A1c 6% on 6/24  Inpatient Diabetes Program Recommendations:    Note Novolog Correction and meal coverage given 2 hours late resulting in hypoglycemia.  RN staff: please check glucose again if outside of insulin administration window to avoid hypoglycemia  Follow trends for now.  Thanks,  Tama Headings RN, MSN, BC-ADM Inpatient Diabetes Coordinator Team Pager [phone] (8a-5p)

## 2021-01-12 NOTE — Progress Notes (Addendum)
Patient Demographics:    Todd [name], is a 65 y.o. male, DOB - 1955/09/07, DXI:[id number]  Admit date - 01/10/2021   Admitting Physician Todd Engelstad Denton Brick, MD  Outpatient Primary MD for the patient is Todd Sender, FNP  LOS - 2   Chief Complaint  Patient presents with   Weakness        Subjective:    Todd [name] today has no fevers, no emesis,  No chest pain,  -Resting comfortably, tolerating clear liquid diet well  Assessment  & Plan :    Principal Problem:   Atrial fibrillation with RVR ([place]) Active Problems:   Hypomagnesemia   Iron deficiency anemia due to chronic blood loss   Cocaine abuse -on-going   Alcohol abuse, continuous   Diabetes mellitus type 2 with complications (HCC)   Tobacco abuse   Hypothyroidism   Hypokalemia   Hypocalcemia   Blood in stool   Brief Summary:- 65 year old with history of polysubstance abuse including alcohol tobacco and  almost daily cocaine use who is notoriously noncompliant with medications admitted on 01/10/2021 with concerns for generalized weakness and A. fib with RVR in the setting of severely abnormal thyroid and severe electrolyte derangement including hypokalemia, hypomagnesemia and hypocalcemia, patient found to have symptomatic anemia with hemoglobin around 6.6 -Patient will go to [facility] SNF once GI work-up is completed   A/p 1)Chronic A. fib with AVR---most likely related to severe electrolyte derangement including hypophosphatemia, hypocalcemia and severely abnormal thyroid -EKG with A. fib and heart rate up to 153 Magnesium 1.2 >> 2.4  --Calcium is 5.1 with an albumin of 1.6 so corrected calcium is around 7 -Potassium is 2.7 >> 3.7 -echocardiogram without significant aortic stenosis or other outflow obstruction, EF is 55 to 60%, and also without  segmental/Regional wall motion abnormalities. Hold Eliquis due to heme  positive stool with drop in H&H -Continue metoprolol for rate control,  -UDS negative for cocaine - - 2) Severe hypothyroidism--patient is notoriously noncompliant, TSH  189.8 on 12/26/20  - repeat TSH is Now 0.805 -Free T4 was 0.56 on 12/26/20 and Now 1.78-low at 0.5, will repeat -Decreased levothyroxine to 100 mcg from 150 -follow-up with endocrinologist Todd [name] in about 3 weeks for repeat TSH and free T4 advised   3)Acute on Chronic symptomatic anemia-- Hgb 7.8 >>6.7>>6.6  baseline Hgb usually around 9 -Stopped Eliquis Stool occult blood is positive -Continue Protonix -GI consult requested -B12 and folate not low -Serum iron is low at 30, TIBC 145 -Transfuse 2 units of PRBC with Lasix in between on 01/12/21 -GI plans EGD and colonoscopy on 01/13/2021   4)Hypophosphatemia, hypomagnesemia and  hypocalcemia and hypokalemia----severe electrolyte derangement and severely abnormal thyroid probably contributing to patient's arrhythmias -Patient recently had episodes of nonsustained V. Tach during recent hospitalization -Electrolytes as above #1, continue to replace electrolytes including calcium and potassium thyroid function as above #2 -No further arrhythmias at this time   5)DM2-A1c 6.0 reflecting excellent diabetic control PTA, but patient sugars are probably not elevated also because patient really does not eat much he drinks a lot of alcohol and just does not eat much ---Use Novolog/Humalog Sliding scale insulin with Accu-Cheks/Fingersticks as ordered   6)Polysubstance abuse + Etoh Abuse--patient uses alcohol, tobacco and  cocaine almost on a daily basis -lorazepam per CIWA protocol for DT prophylaxis, thiamine and folic acid as ordered --Pt recently refused inpatient rehab for drugs and alcohol --Social worker was able to make arrangements for patient to hopefully go to [facility] as outpatient and possibly get admitted to [facility] in [place] for inpatient rehab -Please  see social worker documentation dated 12/29/2020 from [place] is negative this admission    7)Generalized weakness and deconditioning--- most likely related to severe electrolyte abnormalities including severe hypophosphatemia, hypocalcemia and hypokalemia as well as severely abnormal thyroid -Replace electrolytes as above in #4 -Patient evaluated by physical therapist on 01/10/2021 recommends SNF rehab -CT head without contrast with evidence of old strokes, but no new acute infarcts -  8)Anorexia/FTT--- multifactorial, resulting in severe electrolyte abnormalities, -Megace for appetite stimulation as ordered -Okay to take multivitamin and nutritional supplements   9)Social/Ethics--patient technically lives alone in an apartment,  -Typically uses cocaine and alcohol and tobacco on a daily basis -Recently has had multiple hospitalizations to [facility] on [place] noncompliant -High risk for decompensation and death -Plan of care discussed with patient's sisters Todd [name] and Scientist, physiological by phone -Patient is a full code   10)Leukocytosis--  -Chest x-ray without pneumonia, -UA suspicious for possible UTI -IV Rocephin as ordered for possible UTI pending culture data   11)H/o old CVAs--- chronic lacunar infarcts within the bilateral basal Ganglia, Mild chronic small vessel ischemic changes within the cerebral white matter.  -chronic infarct in the right cerebellar hemisphere. ??? Dementia related to old CVAs and EToh Abuse   Disposition/Need for in-Hospital Stay- patient unable to be discharged at this time due to --symptomatic anemia awaiting possible EGD and colonoscopy on 01/13/2021  Status is: Inpatient   Remains inpatient appropriate because: Please see disposition above   Dispo: The patient is from: Home              Anticipated d/c is to: SNF -- [facility] once GI work-up is completed              Anticipated d/c date is: 2 days               Patient currently is not medically stable to d/c. Barriers: Not Clinically Stable-    Code Status : -  Code Status: Full Code    Family Communication:    (patient is alert, awake and coherent) -Plan of care discussed with patient's sisters  Todd [name] and Todd [name] and Todd [name]  Consults  :  Gi  DVT Prophylaxis  :   - SCDs       Lab Results  Component Value Date   PLT 424 (H) 01/12/2021    Inpatient Medications  Scheduled Meds:  allopurinol  100 mg Oral Daily   atorvastatin  40 mg Oral q1800   calcium-vitamin D  2 tablet Oral BID   folic acid  1 mg Oral Daily   Gerhardt's butt cream   Topical TID   insulin aspart  0-5 Units Subcutaneous QHS   insulin aspart  0-9 Units Subcutaneous TID WC   insulin aspart  3 Units Subcutaneous TID WC   levothyroxine  100 mcg Oral Q0600   megestrol  400 mg Oral BID   metoprolol succinate  25 mg Oral Daily   multivitamin with minerals  1 tablet Oral Daily   pantoprazole  40 mg Oral BID   phosphorus  250 mg Oral BID   polyethylene glycol  17 g Oral  Daily   potassium chloride  40 mEq Oral Q3H   sertraline  50 mg Oral QHS   silver sulfADIAZINE  1 application Topical Daily   sodium bicarbonate  650 mg Oral TID   sodium chloride flush  3 mL Intravenous Q12H   thiamine  100 mg Oral Daily   Continuous Infusions:  sodium chloride     calcium gluconate     cefTRIAXone (ROCEPHIN)  IV 1 g (01/12/21 1407)   dextrose 5 % and 0.45 % NaCl with KCl 20 mEq/L Stopped (01/12/21 1719)   PRN Meds:.sodium chloride, acetaminophen **OR** acetaminophen, bisacodyl, diltiazem, ondansetron **OR** ondansetron (ZOFRAN) IV, sodium chloride flush, traZODone    Anti-infectives (From admission, onward)    Start     Dose/Rate Route Frequency Ordered Stop   01/11/21 1145  cefTRIAXone (ROCEPHIN) 1 g in sodium chloride 0.9 % 100 mL IVPB        1 g 200 mL/hr over 30 Minutes Intravenous Every 24 hours 01/11/21 1053           Objective:   Vitals:   01/12/21 1107 01/12/21  1355 01/12/21 1559 01/12/21 1625  BP: 131/81 (!) 144/98 130/83 129/72  Pulse: 78 90 88 84  Resp: 18 18 18 18   Temp: 98.5 F (36.9 C) 98.3 F (36.8 C) 98.7 F (37.1 C) 98.3 F (36.8 C)  TempSrc: Oral Oral Oral Oral  SpO2: 100%  100% 100%  Weight:      Height:        Wt Readings from Last 3 Encounters:  01/10/21 59.1 kg  12/26/20 58.2 kg  12/05/20 55.7 kg     Intake/Output Summary (Last 24 hours) at 01/12/2021 1831 Last data filed at 01/12/2021 1700 Gross per 24 hour  Intake 1956.81 ml  Output 950 ml  Net 1006.81 ml   Physical Exam  Physical Examination: General appearance - alert, chronically ill appearing, and in no distress Mental status - alert, oriented to person, place, and time, , patient does have intermittent episodes of disorientation or confusion Eyes - sclera anicteric Neck - supple, no JVD elevation , Chest -somewhat diminished, no wheezing  heart - S1 and S2 normal, irregularly irregular  abdomen - soft, nontender, nondistended, no masses or organomegaly Neurological -generalized weakness, occasional episodes of disorientation and confusion skin - warm, dry MSK-bilateral AKA   Data Review:   Micro Results Recent Results (from the past 240 hour(s))  Resp Panel by RT-PCR (Flu A&B, Covid) Nasopharyngeal Swab     Status: None   Collection Time: 01/10/21  3:21 PM   Specimen: Nasopharyngeal Swab; Nasopharyngeal(NP) swabs in vial transport medium  Result Value Ref Range Status   SARS Coronavirus 2 by RT PCR NEGATIVE NEGATIVE Final    Comment: (NOTE) SARS-CoV-2 target nucleic acids are NOT DETECTED.  The SARS-CoV-2 RNA is generally detectable in upper respiratory specimens during the acute phase of infection. The lowest concentration of SARS-CoV-2 viral copies this assay can detect is 138 copies/mL. A negative result does not preclude SARS-Cov-2 infection and should not be used as the sole basis for treatment or other patient management decisions. A negative  result may occur with  improper specimen collection/handling, submission of specimen other than nasopharyngeal swab, presence of viral mutation(s) within the areas targeted by this assay, and inadequate number of viral copies(<138 copies/mL). A negative result must be combined with clinical observations, patient history, and epidemiological information. The expected result is Negative.  Fact Sheet for Patients:  EntrepreneurPulse.com.au  Fact Sheet for  Healthcare Providers:  IncredibleEmployment.be  This test is no t yet approved or cleared by the [place] and  has been authorized for detection and/or diagnosis of SARS-CoV-2 by FDA under an Emergency Use Authorization (EUA). This EUA will remain  in effect (meaning this test can be used) for the duration of the COVID-19 declaration under Section 564(b)(1) of the Act, 21 U.S.C.section 360bbb-3(b)(1), unless the authorization is terminated  or revoked sooner.       Influenza A by PCR NEGATIVE NEGATIVE Final   Influenza B by PCR NEGATIVE NEGATIVE Final    Comment: (NOTE) The Xpert Xpress SARS-CoV-2/FLU/RSV plus assay is intended as an aid in the diagnosis of influenza from Nasopharyngeal swab specimens and should not be used as a sole basis for treatment. Nasal washings and aspirates are unacceptable for Xpert Xpress SARS-CoV-2/FLU/RSV testing.  Fact Sheet for Patients: EntrepreneurPulse.com.au  Fact Sheet for Healthcare Providers: IncredibleEmployment.be  This test is not yet approved or cleared by the [place] FDA and has been authorized for detection and/or diagnosis of SARS-CoV-2 by FDA under an Emergency Use Authorization (EUA). This EUA will remain in effect (meaning this test can be used) for the duration of the COVID-19 declaration under Section 564(b)(1) of the Act, 21 U.S.C. section 360bbb-3(b)(1), unless the authorization is  terminated or revoked.  Performed at [facility], [address], [place], [place] [address]     Radiology Reports CT HEAD WO CONTRAST  Result Date: 01/10/2021 CLINICAL DATA:  Mental status change, unknown cause. Additional history provided: Generalized weakness. EXAM: CT HEAD WITHOUT CONTRAST TECHNIQUE: Contiguous axial images were obtained from the base of the skull through the vertex without intravenous contrast. COMPARISON:  Prior head CT examinations 12/21/2020 and earlier. FINDINGS: Brain: Mild-to-moderate generalized cerebral atrophy. Comparatively mild cerebellar atrophy. Redemonstrated chronic lacunar infarcts within the bilateral basal ganglia. Mild patchy and ill-defined hypoattenuation within the cerebral white matter, nonspecific but compatible with chronic small vessel ischemic disease. Redemonstrated chronic infarct within the right cerebellar hemisphere. There is no acute intracranial hemorrhage. No demarcated cortical infarct. No extra-axial fluid collection. No evidence of an intracranial mass. No midline shift. Vascular: No hyperdense vessel.  Atherosclerotic calcifications. Skull: Unchanged nonspecific sclerotic thickening of the outer table of the left parietal calvarium, benign in appearance. Sinuses/Orbits: Visualized orbits show no acute finding. No significant paranasal sinus disease. IMPRESSION: No evidence of acute intracranial abnormality. Redemonstrated chronic lacunar infarcts within the bilateral basal ganglia. Mild chronic small vessel ischemic changes within the cerebral white matter. Redemonstrated chronic infarct in the right cerebellar hemisphere. Electronically Signed   By: Kellie Simmering DO   On: 01/10/2021 17:40   DG CHEST PORT 1 VIEW  Result Date: 01/10/2021 CLINICAL DATA:  Nonproductive cough. Diabetes and hypertension. EXAM: PORTABLE CHEST 1 VIEW COMPARISON:  04/28/2021 FINDINGS: The heart size and mediastinal contours are within normal limits. New diffuse  interstitial infiltrates are seen, suspicious for interstitial edema. No evidence of pulmonary airspace disease or pleural effusion. Multiple surgical clips again seen in the area the thoracic inlet and lower neck. IMPRESSION: New diffuse interstitial infiltrates, suspicious for interstitial edema. Electronically Signed   By: Marlaine Hind M.D.   On: 01/10/2021 19:47   DG Chest Portable 1 View  Result Date: 12/26/2020 CLINICAL DATA:  Weakness and syncopal episode yesterday. EXAM: PORTABLE CHEST 1 VIEW COMPARISON:  November 21, 2020 FINDINGS: Tortuosity and calcific atherosclerotic disease of the aorta. Postsurgical changes at the thoracic inlet. Cardiomediastinal silhouette is normal. Mediastinal contours appear intact. There  is no evidence of pleural effusion or pneumothorax. Streaky airspace opacities in the right lung base. Osseous structures are without acute abnormality. Soft tissues are grossly normal. IMPRESSION: Streaky airspace opacities in the right lung base, which may represent atelectasis or airspace consolidation. Electronically Signed   By: Fidela Salisbury M.D.   On: 12/26/2020 16:13   ECHOCARDIOGRAM COMPLETE  Result Date: 12/27/2020    ECHOCARDIOGRAM REPORT   Patient Name:   PAO HAFFEY Date of Exam: 12/27/2020 Medical Rec #:  [id number]       Height:       68.0 in Accession #:    [id number]      Weight:       128.3 lb Date of Birth:  [birth year]      BSA:          1.692 m Patient Age:    69 years        BP:           108/64 mmHg Patient Gender: M               HR:           71 bpm. Exam Location:  [facility] Procedure: 2D Echo, Cardiac Doppler, Color Doppler and Intracardiac            Opacification Agent Indications:    I48.0 Paroxysmal atrial fibrillation  History:        Patient has prior history of Echocardiogram examinations, most                 recent 02/13/2018. Risk Factors:Hypertension, Diabetes and                 Dyslipidemia. Thyroid Disease. Sepsis.  Sonographer:    Jonelle Sidle  Dance Referring Phys: [id number] JACOB J STINSON  Sonographer Comments: Technically difficult study due to poor echo windows. Image acquisition challenging due to respiratory motion. IMPRESSIONS  1. Left ventricular ejection fraction, by estimation, is 55 to 60%. The left ventricle has normal function. The left ventricle has no regional wall motion abnormalities. There is mild concentric left ventricular hypertrophy. Left ventricular diastolic parameters are consistent with Grade I diastolic dysfunction (impaired relaxation).  2. Right ventricular systolic function is normal. The right ventricular size is normal. Tricuspid regurgitation signal is inadequate for assessing PA pressure.  3. The mitral valve is grossly normal. No evidence of mitral valve regurgitation. No evidence of mitral stenosis.  4. The aortic valve is tricuspid. Aortic valve regurgitation is not visualized. No aortic stenosis is present.  5. The inferior vena cava is normal in size with greater than 50% respiratory variability, suggesting right atrial pressure of 3 mmHg. Comparison(s): No significant change from prior study. FINDINGS  Left Ventricle: Left ventricular ejection fraction, by estimation, is 55 to 60%. The left ventricle has normal function. The left ventricle has no regional wall motion abnormalities. Definity contrast agent was given IV to delineate the left ventricular  endocardial borders. The left ventricular internal cavity size was normal in size. There is mild concentric left ventricular hypertrophy. Left ventricular diastolic parameters are consistent with Grade I diastolic dysfunction (impaired relaxation). Right Ventricle: The right ventricular size is normal. No increase in right ventricular wall thickness. Right ventricular systolic function is normal. Tricuspid regurgitation signal is inadequate for assessing PA pressure. Left Atrium: Left atrial size was normal in size. Right Atrium: Right atrial size was normal in size.  Pericardium: Trivial pericardial effusion is present. Presence of pericardial fat pad. Mitral Valve:  The mitral valve is grossly normal. No evidence of mitral valve regurgitation. No evidence of mitral valve stenosis. Tricuspid Valve: The tricuspid valve is grossly normal. Tricuspid valve regurgitation is trivial. No evidence of tricuspid stenosis. Aortic Valve: The aortic valve is tricuspid. Aortic valve regurgitation is not visualized. No aortic stenosis is present. Pulmonic Valve: The pulmonic valve was not well visualized. Pulmonic valve regurgitation is not visualized. Aorta: The aortic root and ascending aorta are structurally normal, with no evidence of dilitation. Venous: The inferior vena cava is normal in size with greater than 50% respiratory variability, suggesting right atrial pressure of 3 mmHg. IAS/Shunts: The atrial septum is grossly normal.  LEFT VENTRICLE PLAX 2D LVIDd:         4.02 cm  Diastology LVIDs:         2.98 cm  LV e' medial:   4.64 cm/s LV PW:         1.40 cm  LV E/e' medial: 8.4 LV IVS:        1.31 cm LVOT diam:     2.00 cm LV SV:         30 LV SV Index:   17 LVOT Area:     3.14 cm  RIGHT VENTRICLE RV Basal diam:  2.52 cm TAPSE (M-mode): 1.9 cm LEFT ATRIUM             Index       RIGHT ATRIUM           Index LA diam:        3.10 cm 1.83 cm/m  RA Area:     14.80 cm LA Vol (A2C):   59.7 ml 35.28 ml/m RA Volume:   36.70 ml  21.69 ml/m LA Vol (A4C):   37.6 ml 22.22 ml/m LA Biplane Vol: 47.3 ml 27.95 ml/m  AORTIC VALVE LVOT Vmax:   48.80 cm/s LVOT Vmean:  33.600 cm/s LVOT VTI:    0.094 m  AORTA Ao Root diam: 3.30 cm Ao Asc diam:  3.20 cm MITRAL VALVE MV Area (PHT): 2.48 cm    SHUNTS MV Decel Time: 306 msec    Systemic VTI:  0.09 m MV E velocity: 38.80 cm/s  Systemic Diam: 2.00 cm MV A velocity: 42.70 cm/s MV E/A ratio:  0.91 Eleonore Chiquito MD Electronically signed by Eleonore Chiquito MD Signature Date/Time: 12/27/2020/3:39:42 PM    Final      CBC Recent Labs  Lab 01/10/21 0847  01/11/21 0509 01/12/21 0509  WBC 13.2* 11.0* 12.5*  HGB 7.8* 6.7* 6.6*  HCT 24.3* 21.0* 21.4*  PLT 406* 385 424*  MCV 82.7 84.3 84.9  MCH 26.5 26.9 26.2  MCHC 32.1 31.9 30.8  RDW 16.5* 16.6* 16.4*  LYMPHSABS 2.8  --   --   MONOABS 0.7  --   --   EOSABS 0.3  --   --   BASOSABS 0.1  --   --     Chemistries  Recent Labs  Lab 01/10/21 0847 01/10/21 0855 01/11/21 0509 01/12/21 0509  NA 141  --  137 143  K 2.7*  --  3.9 3.7  CL 109  --  112* 114*  CO2 24  --  18* 22  GLUCOSE 94  --  240* 122*  BUN 13  --  19 16  CREATININE 0.93  --  1.23 1.22  CALCIUM 5.2*  --  5.1* 5.2*  MG  --  1.2* 2.4  --   AST 15  --   --   --  ALT 9  --   --   --   ALKPHOS 86  --   --   --   BILITOT 0.8  --   --   --    ------------------------------------------------------------------------------------------------------------------ No results for input(s): CHOL, HDL, LDLCALC, TRIG, CHOLHDL, LDLDIRECT in the last 72 hours.  Lab Results  Component Value Date   HGBA1C 6.0 (H) 12/04/2020   ------------------------------------------------------------------------------------------------------------------ Recent Labs    01/11/21 0509  TSH 0.805   ------------------------------------------------------------------------------------------------------------------ Recent Labs    01/11/21 0509  VITAMINB12 395  FOLATE 29.1  FERRITIN 352*  TIBC 145*  IRON 30*    Coagulation profile No results for input(s): INR, PROTIME in the last 168 hours.  No results for input(s): DDIMER in the last 72 hours.  Cardiac Enzymes No results for input(s): CKMB, TROPONINI, MYOGLOBIN in the last 168 hours.  Invalid input(s): CK ------------------------------------------------------------------------------------------------------------------    Component Value Date/Time   BNP 118.6 (H) 08/03/2016 1119   BNP 37.0 07/03/2016 2143     Jalia Zuniga M.D on 01/12/2021 at 6:31 PM  Go to [email or web address] - for  contact info  [facility] - Office  [phone]

## 2021-01-12 NOTE — TOC Progression Note (Signed)
Transition of Care [facility]) - Progression Note    Patient Details  Name: Todd [name] MRN: [id number] Date of Birth: November 27, 1955  Transition [facility]) CM/SW Contact  Salome Arnt, [place] Phone Number: 01/12/2021, 9:32 AM  Clinical Narrative:  LCSW presented bed offer at [facility] and pt accepts. Facility notified and will start authorization. TOC will follow.      Expected Discharge Plan: [place] Barriers to Discharge: Continued Medical Work up  Expected Discharge Plan and Services Expected Discharge Plan: [place] In-house Referral: Clinical Social Work   Post Acute Care Choice: [place] Living arrangements for the past 2 months: Apartment                                       Social Determinants of Health (SDOH) Interventions    Readmission Risk Interventions No flowsheet data found.

## 2021-01-12 NOTE — Progress Notes (Signed)
Subjective: Feels "fine" today.  No complaints for me.  Denies abdominal pain, nausea, vomiting.  Had 1 bowel movement yesterday.  No BM today.  Per nurse, bright red blood per rectum or melena.  Tolerated regular diet well yesterday.  Currently on clear liquids due to possible endoscopy tomorrow.  Objective: Vital signs in last 24 hours: Temp:  [98.3 F (36.8 C)-98.7 F (37.1 C)] 98.6 F (37 C) (08/02 0315) Pulse Rate:  [82-93] 93 (08/02 0315) Resp:  [16-18] 16 (08/02 0315) BP: (104-129)/(69-81) 129/81 (08/02 0315) SpO2:  [100 %] 100 % (08/02 0315) Last BM Date: 01/11/21 General:   Alert and oriented, pleasant Head:  Normocephalic and atraumatic. Eyes:  No icterus, sclera clear. Conjuctiva pink.  Abdomen:  Bowel sounds present.  Abdomen is protuberant but soft and nontender. No rebound or guarding. No masses appreciated  Msk: Bilateral AKA.[name] Extremities:  Without edema. Neurologic:  Alert and  oriented to person.  Able to tell me that we are a hospital, but thought we were in [place].  Also reported year was 2021. Skin:  Warm and dry, intact without significant lesions.  Psych: Normal mood and affect.  Intake/Output from previous day: 08/01 0701 - 08/02 0700 In: 2606.9 [P.O.:1440; I.V.:1066.9; IV Piggyback:100] Out: 825 [Urine:825] Intake/Output this shift: Total I/O In: 589.2 [P.O.:200; I.V.:389.2] Out: -   Lab Results: Recent Labs    01/10/21 0847 01/11/21 0509 01/12/21 0509  WBC 13.2* 11.0* 12.5*  HGB 7.8* 6.7* 6.6*  HCT 24.3* 21.0* 21.4*  PLT 406* 385 424*   BMET Recent Labs    01/10/21 0847 01/11/21 0509 01/12/21 0509  NA 141 137 143  K 2.7* 3.9 3.7  CL 109 112* 114*  CO2 24 18* 22  GLUCOSE 94 240* 122*  BUN 13 19 16   CREATININE 0.93 1.23 1.22  CALCIUM 5.2* 5.1* 5.2*   LFT Recent Labs    01/10/21 0847 01/11/21 0509 01/12/21 0509  PROT 6.3*  --   --   ALBUMIN 1.7* 1.6* 1.6*  AST 15  --   --   ALT 9  --   --   ALKPHOS 86  --   --    BILITOT 0.8  --   --    Studies/Results: CT HEAD WO CONTRAST  Result Date: 01/10/2021 CLINICAL DATA:  Mental status change, unknown cause. Additional history provided: Generalized weakness. EXAM: CT HEAD WITHOUT CONTRAST TECHNIQUE: Contiguous axial images were obtained from the base of the skull through the vertex without intravenous contrast. COMPARISON:  Prior head CT examinations 12/21/2020 and earlier. FINDINGS: Brain: Mild-to-moderate generalized cerebral atrophy. Comparatively mild cerebellar atrophy. Redemonstrated chronic lacunar infarcts within the bilateral basal ganglia. Mild patchy and ill-defined hypoattenuation within the cerebral white matter, nonspecific but compatible with chronic small vessel ischemic disease. Redemonstrated chronic infarct within the right cerebellar hemisphere. There is no acute intracranial hemorrhage. No demarcated cortical infarct. No extra-axial fluid collection. No evidence of an intracranial mass. No midline shift. Vascular: No hyperdense vessel.  Atherosclerotic calcifications. Skull: Unchanged nonspecific sclerotic thickening of the outer table of the left parietal calvarium, benign in appearance. Sinuses/Orbits: Visualized orbits show no acute finding. No significant paranasal sinus disease. IMPRESSION: No evidence of acute intracranial abnormality. Redemonstrated chronic lacunar infarcts within the bilateral basal ganglia. Mild chronic small vessel ischemic changes within the cerebral white matter. Redemonstrated chronic infarct in the right cerebellar hemisphere. Electronically Signed   By: Kellie Simmering DO   On: 01/10/2021 17:40   DG CHEST PORT 1  VIEW  Result Date: 01/10/2021 CLINICAL DATA:  Nonproductive cough. Diabetes and hypertension. EXAM: PORTABLE CHEST 1 VIEW COMPARISON:  04/28/2021 FINDINGS: The heart size and mediastinal contours are within normal limits. New diffuse interstitial infiltrates are seen, suspicious for interstitial edema. No evidence  of pulmonary airspace disease or pleural effusion. Multiple surgical clips again seen in the area the thoracic inlet and lower neck. IMPRESSION: New diffuse interstitial infiltrates, suspicious for interstitial edema. Electronically Signed   By: Marlaine Hind M.D.   On: 01/10/2021 19:47    Assessment: 65 y.o. year old male with history of diabetes, atrial fibrillation on Eliquis (started in July), HTN, HLD, hypothyroidism, bilateral AKA, polysubstance abuse including alcohol, tobacco, marijuana, and cocaine, medication noncompliance who was admitted 7/31 with concerns for generalized weakness, found to have A. fib with RVR, multiple electrolyte derangements,  anemia with low serum iron, and UTI.  GI consulted for further evaluation of anemia.   Anemia with heme positive stool: Hemoglobin 7.8 on admission, down from 9.9 two weeks ago, 10.2 in June 2022, and previously 13.5 in August 2021. Iron panel is not consistent with iron deficiency anemia as ferritin is 352 (H), saturation 21%, iron 30 (L), TIBC 145 (L).  Vitamin B12 and folate are within normal limits.  Kidney function is normal but it appears he does have history of kidney dysfunction previously. Patient denies overt GI bleeding though his stool was heme positive.  Denies chronic NSAID use.  He does drink alcohol and was recently started on Eliquis in July. Last colonoscopy was in 2016 with left-sided diverticulosis.  No prior EGD.  His hemoglobin declined to 6.7 yesterday and remained stable at 6.6 this morning.  2 unit PRBCs is now transfusing.  There was some delay with getting blood products yesterday due to positive antibody screen.  Etiology of progressive anemia is not entirely clear.  Iron panel is more consistent with anemia of chronic disease. With heme positive stool and recently starting Eliquis, patient could have occult blood loss from essentially anywhere in his GI tract with differentials including gastritis, duodenitis, PUD, AVMs, or  malignancy.  He will need EGD and colonoscopy for further evaluation which can hopefully be completed tomorrow pending correction of hypocalcemia.  We will go ahead and plan to prep today for procedures tomorrow. Last dose of Eliquis was evening of 7/31.   Electrolyte abnormalities: Hypokalemia/hypomagnesemia resolved.  Hypocalcemia continues.  Corrected calcium for albumin is 6.7 (using 3.5 as normal albumin).  Management per hospitalist.  I did discuss with Dr. Denton Brick who has ordered calcium gluconate.  States he will order additional supplementation this evening.  Atrial fibrillation with RVR: Likely influenced by electrolyte abnormalities and medication noncompliance.  This has improved with metoprolol.  Management per hospitalist.  Plan: Complete 2 unit PRBC transfusion today. Plan for EGD and colonoscopy with propofol with Dr. Laural Golden tomorrow pending correction of calcium based on albumin. The risks, benefits, and alternatives have been discussed with the patient and his sister in detail. Both parties state understanding and desires to proceed.  Continue PPI twice daily. Continue to hold Eliquis. Continue to monitor H&H and for overt GI bleeding. Management of electrolyte abnormalities per hospitalist.   Repeat BMP tomorrow morning prior to endoscopic procedures.     LOS: 2 days    01/12/2021, 9:53 AM   Aliene Altes, PA-C [facility]

## 2021-01-12 NOTE — Progress Notes (Signed)
PT Cancellation Note  Patient Details Name: Lynda Capistran MRN: [id number] DOB: [birth year]   Cancelled Treatment:    Reason Eval/Treat Not Completed: Patient declined, no reason specified Therapist arrived as OT done with evaluation.  Pt declined physical therapy as had just returned to bed.  Will attempt later if available.  Ihor Austin, LPTA/CLT; CBIS [phone]  Aldona Lento 01/12/2021, 10:37 AM

## 2021-01-12 NOTE — Plan of Care (Signed)
  Problem: Acute Rehab OT Goals (only OT should resolve) Goal: Pt. Will Perform Grooming Flowsheets (Taken 01/12/2021 1137) Pt Will Perform Grooming:  sitting  with modified independence Goal: Pt. Will Perform Upper Body Dressing Flowsheets (Taken 01/12/2021 1137) Pt Will Perform Upper Body Dressing:  with modified independence  sitting  with adaptive equipment Goal: Pt. Will Transfer To Toilet Flowsheets (Taken 01/12/2021 1137) Pt Will Transfer to Toilet:  with mod assist  with transfer board  bedside commode Goal: Pt/Caregiver Will Perform Home Exercise Program Flowsheets (Taken 01/12/2021 1137) Pt/caregiver will Perform Home Exercise Program:  Increased strength  Both right and left upper extremity  Independently  Sianna Garofano OT, MOT

## 2021-01-12 NOTE — Evaluation (Signed)
Occupational Therapy Evaluation Patient Details Name: Todd [name] MRN: [id number] DOB: 02/08/1956 Today's Date: 01/12/2021    History of Present Illness According to the patient's sister he has been extremely weak for a number days.  She came up to help him last week and he was covered in feces and was more confused than he normally was.  Patient has a history of atrial fib also   Clinical Impression   Pt agreeable to OT evaluation. Pt requires mod A for supine to sit bed mobility with 2 hand held support and physical assist to maintain seated balance at EOB. Pt demonstrates general B UE weakness. Pt will benefit from continued OT in the hospital and recommended venue below to increase strength, balance, and endurance for safe ADL's.       Follow Up Recommendations  SNF    Equipment Recommendations  None recommended by OT           Precautions / Restrictions Precautions Precautions: Fall Restrictions Weight Bearing Restrictions: No      Mobility Bed Mobility Overal bed mobility: Needs Assistance Bed Mobility: Supine to Sit     Supine to sit: Mod assist     General bed mobility comments: HOB elevated    Transfers                      Balance Overall balance assessment: Needs assistance Sitting-balance support: Bilateral upper extremity supported Sitting balance-Leahy Scale: Poor Sitting balance - Comments: needs physcial assist and 2 hand support to maintain upright sitting position Postural control: Posterior lean                                                                                 Vision Baseline Vision/History: Wears glasses Wears Glasses:  (PRN) Patient Visual Report: No change from baseline                  Pertinent Vitals/Pain Pain Assessment: No/denies pain     Hand Dominance Right   Extremity/Trunk Assessment Upper Extremity Assessment Upper Extremity Assessment: Generalized  weakness   Lower Extremity Assessment Lower Extremity Assessment: Defer to PT evaluation       Communication Communication Communication: No difficulties   Cognition Arousal/Alertness: Awake/alert Behavior During Therapy: WFL for tasks assessed/performed Overall Cognitive Status: Within Functional Limits for tasks assessed                                                      Home Living Family/patient expects to be discharged to:: Private residence Living Arrangements: Alone Available Help at Discharge: Personal care attendant;Available PRN/intermittently (nurse 3x a week for 2 to 3 hours) Type of Home: Apartment Home Access: Ramped entrance     Home Layout: One level     Bathroom Shower/Tub: Occupational psychologist: Handicapped height Bathroom Accessibility: Yes   Home Equipment: Wheelchair - manual;Hospital bed (overhead tripod)          Prior Functioning/Environment Level of Independence: Independent with assistive device(s)  Comments: prior to all hospitilizations, but since last hospitalization he has needed physical assist wtih all ADLs and self care        OT Problem List: Decreased strength;Decreased activity tolerance;Impaired balance (sitting and/or standing)      OT Treatment/Interventions: Self-care/ADL training;Therapeutic exercise;Therapeutic activities;Patient/family education;Balance training    OT Goals(Current goals can be found in the care plan section) Acute Rehab OT Goals Patient Stated Goal: to go home OT Goal Formulation: With patient Time For Goal Achievement: 01/26/21 Potential to Achieve Goals: Fair  OT Frequency: Min 2X/week    End of Session    Activity Tolerance: Patient tolerated treatment well Patient left: in bed;with call bell/phone within reach;with bed alarm set;with family/visitor present  OT Visit Diagnosis: Muscle weakness (generalized) (M62.81)                Time:  1017-1030 OT Time Calculation (min): 13 min Charges:  OT General Charges $OT Visit: 1 Visit OT Evaluation $OT Eval Moderate Complexity: 1 Mod  Todd [name] OT, MOT  [employer] 01/12/2021, 11:34 AM

## 2021-01-13 ENCOUNTER — Encounter (HOSPITAL_COMMUNITY): Payer: Self-pay | Admitting: Family Medicine

## 2021-01-13 ENCOUNTER — Inpatient Hospital Stay (HOSPITAL_COMMUNITY): Payer: Medicare HMO | Admitting: Anesthesiology

## 2021-01-13 ENCOUNTER — Encounter (HOSPITAL_COMMUNITY)
Admission: EM | Disposition: A | Discharge status: Skilled Nursing Facility | Payer: Self-pay | Source: Home / Self Care | Attending: Family Medicine

## 2021-01-13 DIAGNOSIS — K6289 Other specified diseases of anus and rectum: Secondary | ICD-10-CM

## 2021-01-13 DIAGNOSIS — K6389 Other specified diseases of intestine: Secondary | ICD-10-CM

## 2021-01-13 DIAGNOSIS — E118 Type 2 diabetes mellitus with unspecified complications: Secondary | ICD-10-CM

## 2021-01-13 DIAGNOSIS — D500 Iron deficiency anemia secondary to blood loss (chronic): Secondary | ICD-10-CM

## 2021-01-13 DIAGNOSIS — K2289 Other specified disease of esophagus: Secondary | ICD-10-CM

## 2021-01-13 DIAGNOSIS — D123 Benign neoplasm of transverse colon: Secondary | ICD-10-CM

## 2021-01-13 DIAGNOSIS — E876 Hypokalemia: Secondary | ICD-10-CM

## 2021-01-13 DIAGNOSIS — K209 Esophagitis, unspecified without bleeding: Secondary | ICD-10-CM

## 2021-01-13 DIAGNOSIS — D125 Benign neoplasm of sigmoid colon: Secondary | ICD-10-CM

## 2021-01-13 DIAGNOSIS — F1410 Cocaine abuse, uncomplicated: Secondary | ICD-10-CM

## 2021-01-13 DIAGNOSIS — K2970 Gastritis, unspecified, without bleeding: Secondary | ICD-10-CM

## 2021-01-13 DIAGNOSIS — K229 Disease of esophagus, unspecified: Secondary | ICD-10-CM

## 2021-01-13 DIAGNOSIS — K5730 Diverticulosis of large intestine without perforation or abscess without bleeding: Secondary | ICD-10-CM

## 2021-01-13 HISTORY — PX: POLYPECTOMY: SHX5525

## 2021-01-13 HISTORY — PX: ESOPHAGOGASTRODUODENOSCOPY (EGD) WITH PROPOFOL: SHX5813

## 2021-01-13 HISTORY — PX: BIOPSY: SHX5522

## 2021-01-13 HISTORY — PX: COLONOSCOPY WITH PROPOFOL: SHX5780

## 2021-01-13 LAB — TYPE AND SCREEN
ABO/RH(D): B POS
Antibody Screen: POSITIVE
Unit division: 0
Unit division: 0

## 2021-01-13 LAB — RENAL FUNCTION PANEL
Albumin: 1.8 g/dL — ABNORMAL LOW (ref 3.5–5.0)
Anion gap: 10 (ref 5–15)
BUN: 10 mg/dL (ref 8–23)
CO2: 22 mmol/L (ref 22–32)
Calcium: 5.7 mg/dL — CL (ref 8.9–10.3)
Chloride: 105 mmol/L (ref 98–111)
Creatinine, Ser: 1.19 mg/dL (ref 0.61–1.24)
GFR, Estimated: >60 mL/min (ref >60)
Glucose, Bld: 88 mg/dL (ref 70–99)
Phosphorus: 5.3 mg/dL — ABNORMAL HIGH (ref 2.5–4.6)
Potassium: 4.6 mmol/L (ref 3.5–5.1)
Sodium: 137 mmol/L (ref 135–145)

## 2021-01-13 LAB — GLUCOSE, CAPILLARY
Glucose-Capillary: 75 mg/dL (ref 70–99)
Glucose-Capillary: 141 mg/dL — ABNORMAL HIGH (ref 70–99)
Glucose-Capillary: 69 mg/dL — ABNORMAL LOW (ref 70–99)
Glucose-Capillary: 142 mg/dL — ABNORMAL HIGH (ref 70–99)

## 2021-01-13 LAB — CBC
HCT: 32.8 % — ABNORMAL LOW (ref 39.0–52.0)
Hemoglobin: 10.9 g/dL — ABNORMAL LOW (ref 13.0–17.0)
MCH: 27.7 pg (ref 26.0–34.0)
MCHC: 33.2 g/dL (ref 30.0–36.0)
MCV: 83.2 fL (ref 80.0–100.0)
Platelets: 442 10*3/uL — ABNORMAL HIGH (ref 150–400)
RBC: 3.94 MIL/uL — ABNORMAL LOW (ref 4.22–5.81)
RDW: 16.1 % — ABNORMAL HIGH (ref 11.5–15.5)
WBC: 13.8 10*3/uL — ABNORMAL HIGH (ref 4.0–10.5)
nRBC: 0 % (ref 0.0–0.2)

## 2021-01-13 LAB — KOH PREP

## 2021-01-13 LAB — BPAM RBC
Blood Product Expiration Date: 202208072359
Blood Product Expiration Date: 202208282359
ISSUE DATE / TIME: 202208021046
ISSUE DATE / TIME: 202208021605
Unit Type and Rh: 1700
Unit Type and Rh: 9500

## 2021-01-13 LAB — MAGNESIUM: Magnesium: 1.4 mg/dL — ABNORMAL LOW (ref 1.7–2.4)

## 2021-01-13 SURGERY — ESOPHAGOGASTRODUODENOSCOPY (EGD) WITH PROPOFOL
Anesthesia: General

## 2021-01-13 MED ORDER — PHENYLEPHRINE 40 MCG/ML (10ML) SYRINGE FOR IV PUSH (FOR BLOOD PRESSURE SUPPORT)
PREFILLED_SYRINGE | INTRAVENOUS | Status: DC | PRN
  Administered 2021-01-13 (×3): 80 ug via INTRAVENOUS

## 2021-01-13 MED ORDER — CALCIUM GLUCONATE-NACL 1-0.675 GM/50ML-% IV SOLN
1.0000 g | Freq: Once | INTRAVENOUS | Status: AC
  Administered 2021-01-13: 1000 mg via INTRAVENOUS
  Filled 2021-01-13: qty 50

## 2021-01-13 MED ORDER — DEXTROSE 50 % IV SOLN
INTRAVENOUS | Status: AC
  Filled 2021-01-13: qty 50

## 2021-01-13 MED ORDER — PROPOFOL 10 MG/ML IV BOLUS
INTRAVENOUS | Status: DC | PRN
  Administered 2021-01-13: 50 mg via INTRAVENOUS
  Administered 2021-01-13: 100 mg via INTRAVENOUS

## 2021-01-13 MED ORDER — MIDAZOLAM HCL 2 MG/2ML IJ SOLN
INTRAMUSCULAR | Status: DC | PRN
  Administered 2021-01-13 (×2): 1 mg via INTRAVENOUS

## 2021-01-13 MED ORDER — MIDAZOLAM HCL 2 MG/2ML IJ SOLN
INTRAMUSCULAR | Status: AC
  Filled 2021-01-13: qty 2

## 2021-01-13 MED ORDER — PROPOFOL 500 MG/50ML IV EMUL
INTRAVENOUS | Status: DC | PRN
  Administered 2021-01-13: 100 ug/kg/min via INTRAVENOUS

## 2021-01-13 MED ORDER — STERILE WATER FOR IRRIGATION IR SOLN
Status: DC | PRN
  Administered 2021-01-13: 200 mL

## 2021-01-13 MED ORDER — LACTATED RINGERS IV SOLN: INTRAVENOUS | Status: DC | PRN

## 2021-01-13 MED ORDER — PHENYLEPHRINE 40 MCG/ML (10ML) SYRINGE FOR IV PUSH (FOR BLOOD PRESSURE SUPPORT)
PREFILLED_SYRINGE | INTRAVENOUS | Status: AC
  Filled 2021-01-13: qty 10

## 2021-01-13 MED ORDER — SODIUM CHLORIDE 0.9 % IV SOLN
INTRAVENOUS | Status: DC
Start: 2021-01-13 — End: 2021-01-13

## 2021-01-13 MED ORDER — CALCIUM GLUCONATE-NACL 2-0.675 GM/100ML-% IV SOLN: 2.0000 g | Freq: Once | INTRAVENOUS | Status: DC

## 2021-01-13 MED ORDER — MAGNESIUM SULFATE 4 GM/100ML IV SOLN
4.0000 g | Freq: Once | INTRAVENOUS | Status: AC
  Administered 2021-01-13: 4 g via INTRAVENOUS
  Filled 2021-01-13: qty 100

## 2021-01-13 MED ORDER — DEXTROSE 50 % IV SOLN
1.0000 | Freq: Once | INTRAVENOUS | Status: AC
  Administered 2021-01-13: 50 mL via INTRAVENOUS

## 2021-01-13 MED ORDER — LIDOCAINE HCL (CARDIAC) PF 100 MG/5ML IV SOSY
PREFILLED_SYRINGE | INTRAVENOUS | Status: DC | PRN
  Administered 2021-01-13: 50 mg via INTRAVENOUS

## 2021-01-13 NOTE — Anesthesia Preprocedure Evaluation (Signed)
Anesthesia Evaluation  Patient identified by MRN, date of birth, ID band Patient awake    Reviewed: Allergy & Precautions, NPO status , Patient's Chart, lab work & pertinent test results  Airway Mallampati: II  TM Distance: >3 FB Neck ROM: Full    Dental  (+) Dental Advisory Given, Partial Upper, Missing, Poor Dentition   Pulmonary Current Smoker and Patient abstained from smoking.,    Pulmonary exam normal breath sounds clear to auscultation       Cardiovascular Exercise Tolerance: Poor hypertension, Pt. on medications Normal cardiovascular exam+ dysrhythmias Atrial Fibrillation and Ventricular Tachycardia  Rhythm:Regular Rate:Normal     Neuro/Psych PSYCHIATRIC DISORDERS    GI/Hepatic (+)     substance abuse  alcohol use and cocaine use, GI bleeding   Endo/Other  diabetes, Poorly Controlled, Type 2Hypothyroidism   Renal/GU Renal InsufficiencyRenal disease (AKI)     Musculoskeletal Bilateral AKA   Abdominal   Peds  Hematology  (+) anemia ,   Anesthesia Other Findings Hypomagnesemia, hypocalcemia  Reproductive/Obstetrics negative OB ROS                            Anesthesia Physical Anesthesia Plan  ASA: 4  Anesthesia Plan: General   Post-op Pain Management:    Induction: Intravenous  PONV Risk Score and Plan: TIVA  Airway Management Planned: Nasal Cannula and Natural Airway  Additional Equipment:   Intra-op Plan:   Post-operative Plan:   Informed Consent: I have reviewed the patients History and Physical, chart, labs and discussed the procedure including the risks, benefits and alternatives for the proposed anesthesia with the patient or authorized representative who has indicated his/her understanding and acceptance.     Dental advisory given  Plan Discussed with: CRNA and Surgeon  Anesthesia Plan Comments:         Anesthesia Quick Evaluation

## 2021-01-13 NOTE — Progress Notes (Signed)
Pt dentures returned in PACU

## 2021-01-13 NOTE — Progress Notes (Signed)
Patient Demographics:    Todd [name], is a 65 y.o. male, DOB - 1956-06-05, TTS:[id number]  Admit date - 01/10/2021   Admitting Physician Courage Denton Brick, MD  Outpatient Primary MD for the patient is Todd Sender, FNP  LOS - 3   Chief Complaint  Patient presents with   Weakness        Subjective:    Claudis Giovanelli tolerated his EGD and colonoscopy today and now tolerating his diet.    Assessment  & Plan :    Principal Problem:   Atrial fibrillation with RVR (HCC) Active Problems:   Hypothyroidism   Hypokalemia   Hypocalcemia   Cocaine abuse -on-going   Alcohol abuse, continuous   Diabetes mellitus type 2 with complications (HCC)   Tobacco abuse   Hypomagnesemia   Iron deficiency anemia due to chronic blood loss   Blood in stool   Brief Summary:- 65 year old with history of polysubstance abuse including alcohol tobacco and  almost daily cocaine use who is notoriously noncompliant with medications admitted on 01/10/2021 with concerns for generalized weakness and A. fib with RVR in the setting of severely abnormal thyroid and severe electrolyte derangement including hypokalemia, hypomagnesemia and hypocalcemia, patient found to have symptomatic anemia with hemoglobin around 6.6 -Patient will go to [facility] SN[facility] once GI work-up is completed   A/P  1)Chronic A. fib with AVR---most likely related to severe electrolyte derangement including hypophosphatemia, hypocalcemia and severely abnormal thyroid -EKG with A. fib and heart rate up to 153 Magnesium 1.2 >> 2.4  --Calcium is 5.1 with an albumin of 1.6 so corrected calcium is around 7 -Potassium is 2.7 >> 3.7 -echocardiogram without significant aortic stenosis or other outflow obstruction, EF is 55 to 60%, and also without  segmental/Regional wall motion abnormalities. Hold Eliquis due to heme positive stool with drop in  H&H -Continue metoprolol for rate control,  -UDS negative for cocaine   2) Severe hypothyroidism--patient is notoriously noncompliant, TSH  189.8 on 12/26/20  - repeat TSH is Now 0.805 -Free T4 was 0.56 on 12/26/20 and Now 1.78-low at 0.5, will repeat -Decreased levothyroxine to 100 mcg from 150 -follow-up with endocrinologist Dr. Dorris Fetch in about 3 weeks for repeat TSH and free T4 advised   3)Acute on Chronic symptomatic anemia-- Hgb 7.8 >>6.7>>6.6  baseline Hgb usually around 9 -Stopped Eliquis Stool occult blood is positive -Continue Protonix -GI consult requested -B12 and folate not low -Serum iron is low at 30, TIBC 145 -Transfuse 2 units of PRBC with Lasix in between on 01/12/21 -GI plans EGD and colonoscopy on 01/13/2021 with findings:  EGD Patchy whitish material coating mucosa of proximal esophagus.  Brushing taken for KOH prep. Grade B esophagitis involving distal 2 cm of esophageal mucosa.  Biopsy taken.  Bottle #1.   GE junction at 42 cm from the incisors. Nonerosive antral gastritis and erosive bulbar duodenitis. Antral biopsy taken for routine histology.  Bottle #2.   Colonoscopy Prep adequate. Examination performed to cecum. 2 small polyps cold snared from splenic flexure and placed in bottle #3. 8 mm polyp hot snare from sigmoid colon and placed in bottle #3. Sigmoid colon diverticulosis Edema to rectal mucosa. Diminished anal sphincter tone.   4)Hypophosphatemia, hypomagnesemia and  hypocalcemia and hypokalemia----severe  electrolyte derangement and severely abnormal thyroid probably contributing to patient's arrhythmias -Patient recently had episodes of nonsustained V. Tach during recent hospitalization -Electrolytes as above #1, continue to replace electrolytes including calcium and potassium thyroid function as above #2 -No further arrhythmias at this time   5)DM2-A1c 6.0 reflecting excellent diabetic control PTA, but patient sugars are probably not elevated also  because patient really does not eat much he drinks a lot of alcohol and just does not eat much ---Use Novolog/Humalog Sliding scale insulin with Accu-Cheks/Fingersticks as ordered   6)Polysubstance abuse + Etoh Abuse--patient uses alcohol, tobacco and cocaine almost on a daily basis -lorazepam per CIWA protocol for DT prophylaxis, thiamine and folic acid as ordered --Pt recently refused inpatient rehab for drugs and alcohol --Social worker was able to make arrangements for patient to hopefully go to [facility] as outpatient and possibly get admitted to [facility] in [place] for inpatient rehab -Please see social worker documentation dated 12/29/2020 from [place] is negative this admission    7)Generalized weakness and deconditioning--- most likely related to severe electrolyte abnormalities including severe hypophosphatemia, hypocalcemia and hypokalemia as well as severely abnormal thyroid -Replace electrolytes as above in #4 -Patient evaluated by physical therapist on 01/10/2021 recommends SNF rehab -CT head without contrast with evidence of old strokes, but no new acute infarcts -  8)Anorexia/FTT--- multifactorial, resulting in severe electrolyte abnormalities, -Megace for appetite stimulation as ordered -Okay to take multivitamin and nutritional supplements   9)Social/Ethics--patient technically lives alone in an apartment,  -Typically uses cocaine and alcohol and tobacco on a daily basis -Recently has had multiple hospitalizations to [facility] on [place] noncompliant -High risk for decompensation and death -Plan of care discussed with patient's sisters Todd [name] and Todd [name], physiological by phone -Patient is a full code   10)Leukocytosis--  -Chest x-ray without pneumonia, -UA suspicious for possible UTI -IV Rocephin as ordered for possible UTI pending culture data   11)H/o old CVAs--- chronic lacunar infarcts within the bilateral basal Ganglia, Mild  chronic small vessel ischemic changes within the cerebral white matter.  -chronic infarct in the right cerebellar hemisphere. ??? Dementia related to old CVAs and EToh Abuse   Disposition/Need for in-Hospital Stay- patient unable to be discharged at this time due to --symptomatic anemia awaiting possible EGD and colonoscopy on 01/13/2021  Status is: Inpatient   Remains inpatient appropriate because: Please see disposition above   Dispo: The patient is from: Home              Anticipated d/c is to: SNF -- [facility] once GI work-up is completed              Anticipated d/c date is: 2 days              Patient currently is not medically stable to d/c. Barriers: Not Clinically Stable-    Code Status : -  Code Status: Full Code    Family Communication:    (patient is alert, awake and coherent) -Plan of care discussed with patient's sisters  Todd [name] and [name] and [name]  Consults  :  Gi  DVT Prophylaxis  :   - SCDs       Lab Results  Component Value Date   PLT 442 (H) 01/13/2021    Inpatient Medications  Scheduled Meds:  allopurinol  100 mg Oral Daily   atorvastatin  40 mg Oral q1800   calcium-vitamin D  2 tablet Oral BID   folic acid  1 mg Oral Daily   Gerhardt's butt cream   Topical TID   insulin aspart  0-5 Units Subcutaneous QHS   insulin aspart  0-9 Units Subcutaneous TID WC   insulin aspart  3 Units Subcutaneous TID WC   levothyroxine  100 mcg Oral Q0600   megestrol  400 mg Oral BID   metoprolol succinate  25 mg Oral Daily   multivitamin with minerals  1 tablet Oral Daily   pantoprazole  40 mg Oral BID   polyethylene glycol  17 g Oral Daily   sertraline  50 mg Oral QHS   silver sulfADIAZINE  1 application Topical Daily   sodium bicarbonate  650 mg Oral TID   sodium chloride flush  3 mL Intravenous Q12H   thiamine  100 mg Oral Daily   Continuous Infusions:  sodium chloride     cefTRIAXone (ROCEPHIN)  IV 1 g (01/12/21 1407)   dextrose 5 % and 0.45 % NaCl with KCl 20  mEq/L Stopped (01/12/21 1719)   PRN Meds:.sodium chloride, acetaminophen **OR** acetaminophen, bisacodyl, diltiazem, ondansetron **OR** ondansetron (ZOFRAN) IV, sodium chloride flush, traZODone    Anti-infectives (From admission, onward)    Start     Dose/Rate Route Frequency Ordered Stop   01/11/21 1145  cefTRIAXone (ROCEPHIN) 1 g in sodium chloride 0.9 % 100 mL IVPB        1 g 200 mL/hr over 30 Minutes Intravenous Every 24 hours 01/11/21 1053           Objective:   Vitals:   01/13/21 1310 01/13/21 1315 01/13/21 1332 01/13/21 1457  BP:  (!) 127/98 (!) 157/93 (!) 156/95  Pulse:    77  Resp:  (!) 22 16 18   Temp:    97.9 F (36.6 C)  TempSrc:    Oral  SpO2: 99% 97% 98% 100%  Weight:      Height:        Wt Readings from Last 3 Encounters:  01/10/21 59.1 kg  12/26/20 58.2 kg  12/05/20 55.7 kg   Intake/Output Summary (Last 24 hours) at 01/13/2021 1708 Last data filed at 01/13/2021 1330 Gross per 24 hour  Intake 1881.75 ml  Output 1300 ml  Net 581.75 ml   Physical Exam  Physical Examination: General appearance - alert, chronically ill appearing, and in no distress Mental status - alert, oriented to person, place, and time,  patient does have intermittent episodes of disorientation or confusion Eyes - sclera anicteric Neck - supple, no JVD elevation , Chest -somewhat diminished, no wheezing  heart - S1 and S2 normal, irregularly irregular  abdomen - soft, nontender, nondistended, no masses or organomegaly Neurological -generalized weakness, occasional episodes of disorientation and confusion skin - warm, dry MSK-bilateral AKA   Data Review:   Micro Results Recent Results (from the past 240 hour(s))  Resp Panel by RT-PCR (Flu A&B, Covid) Nasopharyngeal Swab     Status: None   Collection Time: 01/10/21  3:21 PM   Specimen: Nasopharyngeal Swab; Nasopharyngeal(NP) swabs in vial transport medium  Result Value Ref Range Status   SARS Coronavirus 2 by RT PCR NEGATIVE  NEGATIVE Final    Comment: (NOTE) SARS-CoV-2 target nucleic acids are NOT DETECTED.  The SARS-CoV-2 RNA is generally detectable in upper respiratory specimens during the acute phase of infection. The lowest concentration of SARS-CoV-2 viral copies this assay can detect is 138 copies/mL. A negative result does not preclude SARS-Cov-2 infection and should not be used as the sole basis for treatment  or other patient management decisions. A negative result may occur with  improper specimen collection/handling, submission of specimen other than nasopharyngeal swab, presence of viral mutation(s) within the areas targeted by this assay, and inadequate number of viral copies(<138 copies/mL). A negative result must be combined with clinical observations, patient history, and epidemiological information. The expected result is Negative.  Fact Sheet for Patients:  EntrepreneurPulse.com.au  Fact Sheet for Healthcare Providers:  IncredibleEmployment.be  This test is no t yet approved or cleared by the [place] FDA and  has been authorized for detection and/or diagnosis of SARS-CoV-2 by FDA under an Emergency Use Authorization (EUA). This EUA will remain  in effect (meaning this test can be used) for the duration of the COVID-19 declaration under Section 564(b)(1) of the Act, 21 U.S.C.section 360bbb-3(b)(1), unless the authorization is terminated  or revoked sooner.       Influenza A by PCR NEGATIVE NEGATIVE Final   Influenza B by PCR NEGATIVE NEGATIVE Final    Comment: (NOTE) The Xpert Xpress SARS-CoV-2/FLU/RSV plus assay is intended as an aid in the diagnosis of influenza from Nasopharyngeal swab specimens and should not be used as a sole basis for treatment. Nasal washings and aspirates are unacceptable for Xpert Xpress SARS-CoV-2/FLU/RSV testing.  Fact Sheet for Patients: EntrepreneurPulse.com.au  Fact Sheet for Healthcare  Providers: IncredibleEmployment.be  This test is not yet approved or cleared by the [place] FDA and has been authorized for detection and/or diagnosis of SARS-CoV-2 by FDA under an Emergency Use Authorization (EUA). This EUA will remain in effect (meaning this test can be used) for the duration of the COVID-19 declaration under Section 564(b)(1) of the Act, 21 U.S.C. section 360bbb-3(b)(1), unless the authorization is terminated or revoked.  Performed at [facility], [address], [place], [place] [address]   Urine Culture     Status: Abnormal (Preliminary result)   Collection Time: 01/11/21 10:32 AM   Specimen: Urine, Clean Catch  Result Value Ref Range Status   Specimen Description   Final    URINE, CLEAN CATCH Performed at [facility], [address] [place], [place] [address]    Special Requests   Final    NONE Performed at [facility], [address]  [place], [place] [address]    Culture (A)  Final    50,000 COLONIES/mL ESCHERICHIA COLI SUSCEPTIBILITIES TO FOLLOW Performed at [facility], [place] [address] [place], [place] [address]    Report Status PENDING  Incomplete  KOH prep     Status: None   Collection Time: 01/13/21 11:53 AM   Specimen: PATH GI Other  Result Value Ref Range Status   Specimen Description ESOPHAGUS  Final   Special Requests NONE  Final   KOH Prep   Final    FEW YEAST Performed at [facility], [address] [place],  [address]    Report Status 01/13/2021 FINAL  Final    Radiology Reports CT HEAD WO CONTRAST  Result Date: 01/10/2021 CLINICAL DATA:  Mental status change, unknown cause. Additional history provided: Generalized weakness. EXAM: CT HEAD WITHOUT CONTRAST TECHNIQUE: Contiguous axial images were obtained from the base of the skull through the vertex without intravenous contrast. COMPARISON:  Prior head CT examinations 12/21/2020 and earlier. FINDINGS: Brain: Mild-to-moderate generalized cerebral  atrophy. Comparatively mild cerebellar atrophy. Redemonstrated chronic lacunar infarcts within the bilateral basal ganglia. Mild patchy and ill-defined hypoattenuation within the cerebral white matter, nonspecific but compatible with chronic small vessel ischemic disease. Redemonstrated chronic infarct within  the right cerebellar hemisphere. There is no acute intracranial hemorrhage. No demarcated cortical infarct. No extra-axial fluid collection. No evidence of an intracranial mass. No midline shift. Vascular: No hyperdense vessel.  Atherosclerotic calcifications. Skull: Unchanged nonspecific sclerotic thickening of the outer table of the left parietal calvarium, benign in appearance. Sinuses/Orbits: Visualized orbits show no acute finding. No significant paranasal sinus disease. IMPRESSION: No evidence of acute intracranial abnormality. Redemonstrated chronic lacunar infarcts within the bilateral basal ganglia. Mild chronic small vessel ischemic changes within the cerebral white matter. Redemonstrated chronic infarct in the right cerebellar hemisphere. Electronically Signed   By: Kellie Simmering DO   On: 01/10/2021 17:40   DG CHEST PORT 1 VIEW  Result Date: 01/10/2021 CLINICAL DATA:  Nonproductive cough. Diabetes and hypertension. EXAM: PORTABLE CHEST 1 VIEW COMPARISON:  04/28/2021 FINDINGS: The heart size and mediastinal contours are within normal limits. New diffuse interstitial infiltrates are seen, suspicious for interstitial edema. No evidence of pulmonary airspace disease or pleural effusion. Multiple surgical clips again seen in the area the thoracic inlet and lower neck. IMPRESSION: New diffuse interstitial infiltrates, suspicious for interstitial edema. Electronically Signed   By: Marlaine Hind M.D.   On: 01/10/2021 19:47   DG Chest Portable 1 View  Result Date: 12/26/2020 CLINICAL DATA:  Weakness and syncopal episode yesterday. EXAM: PORTABLE CHEST 1 VIEW COMPARISON:  November 21, 2020 FINDINGS:  Tortuosity and calcific atherosclerotic disease of the aorta. Postsurgical changes at the thoracic inlet. Cardiomediastinal silhouette is normal. Mediastinal contours appear intact. There is no evidence of pleural effusion or pneumothorax. Streaky airspace opacities in the right lung base. Osseous structures are without acute abnormality. Soft tissues are grossly normal. IMPRESSION: Streaky airspace opacities in the right lung base, which may represent atelectasis or airspace consolidation. Electronically Signed   By: Fidela Salisbury M.D.   On: 12/26/2020 16:13   ECHOCARDIOGRAM COMPLETE  Result Date: 12/27/2020    ECHOCARDIOGRAM REPORT   Patient Name:   TEAL BONTRAGER Date of Exam: 12/27/2020 Medical Rec #:  [id number]       Height:       68.0 in Accession #:    [id number]      Weight:       128.3 lb Date of Birth:  [birth year]      BSA:          1.692 m Patient Age:    58 years        BP:           108/64 mmHg Patient Gender: M               HR:           71 bpm. Exam Location:  [facility] Procedure: 2D Echo, Cardiac Doppler, Color Doppler and Intracardiac            Opacification Agent Indications:    I48.0 Paroxysmal atrial fibrillation  History:        Patient has prior history of Echocardiogram examinations, most                 recent 02/13/2018. Risk Factors:Hypertension, Diabetes and                 Dyslipidemia. Thyroid Disease. Sepsis.  Sonographer:    Jonelle Sidle Dance Referring Phys: [id number] JACOB J STINSON  Sonographer Comments: Technically difficult study due to poor echo windows. Image acquisition challenging due to respiratory motion. IMPRESSIONS  1. Left ventricular ejection fraction, by estimation, is 55 to 60%. The  left ventricle has normal function. The left ventricle has no regional wall motion abnormalities. There is mild concentric left ventricular hypertrophy. Left ventricular diastolic parameters are consistent with Grade I diastolic dysfunction (impaired relaxation).  2. Right ventricular  systolic function is normal. The right ventricular size is normal. Tricuspid regurgitation signal is inadequate for assessing PA pressure.  3. The mitral valve is grossly normal. No evidence of mitral valve regurgitation. No evidence of mitral stenosis.  4. The aortic valve is tricuspid. Aortic valve regurgitation is not visualized. No aortic stenosis is present.  5. The inferior vena cava is normal in size with greater than 50% respiratory variability, suggesting right atrial pressure of 3 mmHg. Comparison(s): No significant change from prior study. FINDINGS  Left Ventricle: Left ventricular ejection fraction, by estimation, is 55 to 60%. The left ventricle has normal function. The left ventricle has no regional wall motion abnormalities. Definity contrast agent was given IV to delineate the left ventricular  endocardial borders. The left ventricular internal cavity size was normal in size. There is mild concentric left ventricular hypertrophy. Left ventricular diastolic parameters are consistent with Grade I diastolic dysfunction (impaired relaxation). Right Ventricle: The right ventricular size is normal. No increase in right ventricular wall thickness. Right ventricular systolic function is normal. Tricuspid regurgitation signal is inadequate for assessing PA pressure. Left Atrium: Left atrial size was normal in size. Right Atrium: Right atrial size was normal in size. Pericardium: Trivial pericardial effusion is present. Presence of pericardial fat pad. Mitral Valve: The mitral valve is grossly normal. No evidence of mitral valve regurgitation. No evidence of mitral valve stenosis. Tricuspid Valve: The tricuspid valve is grossly normal. Tricuspid valve regurgitation is trivial. No evidence of tricuspid stenosis. Aortic Valve: The aortic valve is tricuspid. Aortic valve regurgitation is not visualized. No aortic stenosis is present. Pulmonic Valve: The pulmonic valve was not well visualized. Pulmonic valve  regurgitation is not visualized. Aorta: The aortic root and ascending aorta are structurally normal, with no evidence of dilitation. Venous: The inferior vena cava is normal in size with greater than 50% respiratory variability, suggesting right atrial pressure of 3 mmHg. IAS/Shunts: The atrial septum is grossly normal.  LEFT VENTRICLE PLAX 2D LVIDd:         4.02 cm  Diastology LVIDs:         2.98 cm  LV e' medial:   4.64 cm/s LV PW:         1.40 cm  LV E/e' medial: 8.4 LV IVS:        1.31 cm LVOT diam:     2.00 cm LV SV:         30 LV SV Index:   17 LVOT Area:     3.14 cm  RIGHT VENTRICLE RV Basal diam:  2.52 cm TAPSE (M-mode): 1.9 cm LEFT ATRIUM             Index       RIGHT ATRIUM           Index LA diam:        3.10 cm 1.83 cm/m  RA Area:     14.80 cm LA Vol (A2C):   59.7 ml 35.28 ml/m RA Volume:   36.70 ml  21.69 ml/m LA Vol (A4C):   37.6 ml 22.22 ml/m LA Biplane Vol: 47.3 ml 27.95 ml/m  AORTIC VALVE LVOT Vmax:   48.80 cm/s LVOT Vmean:  33.600 cm/s LVOT VTI:    0.094 m  AORTA Ao Root diam:  3.30 cm Ao Asc diam:  3.20 cm MITRAL VALVE MV Area (PHT): 2.48 cm    SHUNTS MV Decel Time: 306 msec    Systemic VTI:  0.09 m MV E velocity: 38.80 cm/s  Systemic Diam: 2.00 cm MV A velocity: 42.70 cm/s MV E/A ratio:  0.91 Eleonore Chiquito MD Electronically signed by Eleonore Chiquito MD Signature Date/Time: 12/27/2020/3:39:42 PM    Final      CBC Recent Labs  Lab 01/10/21 1856 01/11/21 0509 01/12/21 0509 01/13/21 0548  WBC 13.2* 11.0* 12.5* 13.8*  HGB 7.8* 6.7* 6.6* 10.9*  HCT 24.3* 21.0* 21.4* 32.8*  PLT 406* 385 424* 442*  MCV 82.7 84.3 84.9 83.2  MCH 26.5 26.9 26.2 27.7  MCHC 32.1 31.9 30.8 33.2  RDW 16.5* 16.6* 16.4* 16.1*  LYMPHSABS 2.8  --   --   --   MONOABS 0.7  --   --   --   EOSABS 0.3  --   --   --   BASOSABS 0.1  --   --   --     Chemistries  Recent Labs  Lab 01/10/21 0847 01/10/21 0855 01/11/21 0509 01/12/21 0509 01/13/21 0548  NA 141  --  137 143 137  K 2.7*  --  3.9 3.7 4.6  CL  109  --  112* 114* 105  CO2 24  --  18* 22 22  GLUCOSE 94  --  240* 122* 88  BUN 13  --  19 16 10   CREATININE 0.93  --  1.23 1.22 1.19  CALCIUM 5.2*  --  5.1* 5.2* 5.7*  MG  --  1.2* 2.4  --  1.4*  AST 15  --   --   --   --   ALT 9  --   --   --   --   ALKPHOS 86  --   --   --   --   BILITOT 0.8  --   --   --   --    ------------------------------------------------------------------------------------------------------------------ No results for input(s): CHOL, HDL, LDLCALC, TRIG, CHOLHDL, LDLDIRECT in the last 72 hours.  Lab Results  Component Value Date   HGBA1C 6.0 (H) 12/04/2020   ------------------------------------------------------------------------------------------------------------------ Recent Labs    01/11/21 0509  TSH 0.805   ------------------------------------------------------------------------------------------------------------------ Recent Labs    01/11/21 0509  VITAMINB12 395  FOLATE 29.1  FERRITIN 352*  TIBC 145*  IRON 30*   Coagulation profile No results for input(s): INR, PROTIME in the last 168 hours.  No results for input(s): DDIMER in the last 72 hours.  Cardiac Enzymes No results for input(s): CKMB, TROPONINI, MYOGLOBIN in the last 168 hours.  Invalid input(s): CK ------------------------------------------------------------------------------------------------------------------    Component Value Date/Time   BNP 118.6 (H) 08/03/2016 1119   BNP 37.0 07/03/2016 2143   Onna Nodal M.D on 01/13/2021 at 5:08 PM  How to contact the [facility] Attending or Consulting provider [place] or covering provider during after hours [place], for this patient?  Check the care team in [facility] and look for a) attending/consulting [facility] provider listed and b) the [facility] team listed Log into [email or web address] and use [place]'s universal password to access. If you do not have the password, please contact the hospital operator. Locate the [facility] provider you are looking for  under [facility] and page to a number that you can be directly reached. If you still have difficulty reaching the provider, please page the [facility] (Director on Call) for the Hospitalists listed  on amion for assistance.   [facility] - Office  [phone]

## 2021-01-13 NOTE — Progress Notes (Signed)
Subjective:  Patient has no complaints.  He states he feels better since he received blood transfusion.  He denies nausea vomiting heartburn dysphagia abdominal pain melena or rectal bleeding.  Patient states he lives alone.  He states he prepares his own meals.  He generally eats foods that he can microwave.  No history of peptic ulcer disease.  Current Medications:  Current Facility-Administered Medications:    [MAR Hold] 0.9 %  sodium chloride infusion, 250 mL, Intravenous, PRN, Emokpae, Courage, MD   0.9 %  sodium chloride infusion, , Intravenous, Continuous, Jodi Mourning, Kristen S, PA-C   [MAR Hold] acetaminophen (TYLENOL) tablet 650 mg, 650 mg, Oral, Q6H PRN **OR** [MAR Hold] acetaminophen (TYLENOL) suppository 650 mg, 650 mg, Rectal, Q6H PRN, Denton Brick, Courage, MD   [MAR Hold] allopurinol (ZYLOPRIM) tablet 100 mg, 100 mg, Oral, Daily, Emokpae, Courage, MD, 100 mg at 01/12/21 0923   [MAR Hold] atorvastatin (LIPITOR) tablet 40 mg, 40 mg, Oral, q1800, Emokpae, Courage, MD, 40 mg at 01/12/21 1719   [MAR Hold] bisacodyl (DULCOLAX) suppository 10 mg, 10 mg, Rectal, Daily PRN, Denton Brick, Courage, MD   [MAR Hold] calcium-vitamin D (OSCAL WITH D) 500-200 MG-UNIT per tablet 2 tablet, 2 tablet, Oral, BID, Emokpae, Courage, MD, 2 tablet at 01/12/21 2151   [facility] Hold] cefTRIAXone (ROCEPHIN) 1 g in sodium chloride 0.9 % 100 mL IVPB, 1 g, Intravenous, Q24H, Emokpae, Courage, MD, Last Rate: 200 mL/hr at 01/12/21 1407, 1 g at 01/12/21 1407   dextrose 5 % and 0.45 % NaCl with KCl 20 mEq/L infusion, , Intravenous, Continuous, Emokpae, Courage, MD, Paused at 01/12/21 1719   [MAR Hold] diltiazem (CARDIZEM) tablet 30 mg, 30 mg, Oral, Q6H PRN, Denton Brick, Courage, MD   [MAR Hold] folic acid (FOLVITE) tablet 1 mg, 1 mg, Oral, Daily, Emokpae, Courage, MD, 1 mg at 01/12/21 0924   [facility] Hold] Gerhardt's butt cream, , Topical, TID, Denton Brick, Courage, MD, Given at 01/12/21 2153   [facility] Hold] insulin aspart (novoLOG) injection 0-5  Units, 0-5 Units, Subcutaneous, QHS, Emokpae, Courage, MD, 3 Units at 01/11/21 2242   [facility] Hold] insulin aspart (novoLOG) injection 0-9 Units, 0-9 Units, Subcutaneous, TID WC, Emokpae, Courage, MD, 2 Units at 01/12/21 0915   [facility] Hold] insulin aspart (novoLOG) injection 3 Units, 3 Units, Subcutaneous, TID WC, Emokpae, Courage, MD, 3 Units at 01/12/21 0916   [MAR Hold] levothyroxine (SYNTHROID) tablet 100 mcg, 100 mcg, Oral, Q0600, Emokpae, Courage, MD, 100 mcg at 01/12/21 0533   [MAR Hold] megestrol (MEGACE) 400 MG/10ML suspension 400 mg, 400 mg, Oral, BID, Emokpae, Courage, MD, 400 mg at 01/12/21 2297   [facility] Hold] metoprolol succinate (TOPROL-XL) 24 hr tablet 25 mg, 25 mg, Oral, Daily, Emokpae, Courage, MD, 25 mg at 01/12/21 0924   [MAR Hold] multivitamin with minerals tablet 1 tablet, 1 tablet, Oral, Daily, Emokpae, Courage, MD, 1 tablet at 01/12/21 0922   [MAR Hold] ondansetron (ZOFRAN) tablet 4 mg, 4 mg, Oral, Q6H PRN **OR** [MAR Hold] ondansetron (ZOFRAN) injection 4 mg, 4 mg, Intravenous, Q6H PRN, Emokpae, Courage, MD   [MAR Hold] pantoprazole (PROTONIX) EC tablet 40 mg, 40 mg, Oral, BID, Emokpae, Courage, MD, 40 mg at 01/12/21 2151   [facility] Hold] polyethylene glycol (MIRALAX / GLYCOLAX) packet 17 g, 17 g, Oral, Daily, Emokpae, Courage, MD, 17 g at 01/12/21 0922   [MAR Hold] sertraline (ZOLOFT) tablet 50 mg, 50 mg, Oral, QHS, Emokpae, Courage, MD, 50 mg at 01/12/21 2151   [facility] Hold] silver sulfADIAZINE (SILVADENE) 1 % cream 1 application, 1  application, Topical, Daily, Emokpae, Courage, MD, 1 application at 91/79/15 0924   simethicone susp in sterile water 1000 mL irrigation, , , PRN, Marjo Grosvenor U, MD, 200 mL at 01/13/21 1116   [MAR Hold] sodium bicarbonate tablet 650 mg, 650 mg, Oral, TID, Emokpae, Courage, MD, 650 mg at 01/12/21 2150   [facility] Hold] sodium chloride flush (NS) 0.9 % injection 3 mL, 3 mL, Intravenous, Q12H, Emokpae, Courage, MD, 3 mL at 01/12/21 2154   [facility] Hold] sodium chloride  flush (NS) 0.9 % injection 3 mL, 3 mL, Intravenous, PRN, Denton Brick, Courage, MD   [MAR Hold] thiamine tablet 100 mg, 100 mg, Oral, Daily, Emokpae, Courage, MD, 100 mg at 01/12/21 0924   [MAR Hold] traZODone (DESYREL) tablet 50 mg, 50 mg, Oral, QHS PRN, Emokpae, Courage, MD, 50 mg at 01/11/21 2143   Objective: Blood pressure (!) 170/92, pulse 79, temperature 98.1 F (36.7 C), temperature source Oral, resp. rate 16, height 5' 8"  (1.727 m), weight 59.1 kg, SpO2 100 %. Patient is alert and in no acute distress. Conjunctiva is pale. Sclera is nonicteric Oropharyngeal mucosa is normal. No neck masses or thyromegaly noted. Cardiac exam with regular rhythm normal S1 and S2. No murmur or gallop noted. Auscultation lungs reveal crackles at right base. Abdomen abdomen is full but soft and nontender without organomegaly or masses. Patient has bilateral AKA's.  There is focal discoloration to skin of left AKA but there is no ulcer.  Labs/studies Results:   CBC Latest Ref Rng & Units 01/13/2021 01/12/2021 01/11/2021  WBC 4.0 - 10.5 K/uL 13.8(H) 12.5(H) 11.0(H)  Hemoglobin 13.0 - 17.0 g/dL 10.9(L) 6.6(LL) 6.7(LL)  Hematocrit 39.0 - 52.0 % 32.8(L) 21.4(L) 21.0(L)  Platelets 150 - 400 K/uL 442(H) 424(H) 385    CMP Latest Ref Rng & Units 01/13/2021 01/12/2021 01/11/2021  Glucose 70 - 99 mg/dL 88 122(H) 240(H)  BUN 8 - 23 mg/dL 10 16 19   Creatinine 0.61 - 1.24 mg/dL 1.19 1.22 1.23  Sodium 135 - 145 mmol/L 137 143 137  Potassium 3.5 - 5.1 mmol/L 4.6 3.7 3.9  Chloride 98 - 111 mmol/L 105 114(H) 112(H)  CO2 22 - 32 mmol/L 22 22 18(L)  Calcium 8.9 - 10.3 mg/dL 5.7(LL) 5.2(LL) 5.1(LL)  Total Protein 6.5 - 8.1 g/dL - - -  Total Bilirubin 0.3 - 1.2 mg/dL - - -  Alkaline Phos 38 - 126 U/L - - -  AST 15 - 41 U/L - - -  ALT 0 - 44 U/L - - -    Hepatic Function Latest Ref Rng & Units 01/13/2021 01/12/2021 01/11/2021  Total Protein 6.5 - 8.1 g/dL - - -  Albumin 3.5 - 5.0 g/dL 1.8(L) 1.6(L) 1.6(L)  AST 15 - 41 U/L - - -   ALT 0 - 44 U/L - - -  Alk Phosphatase 38 - 126 U/L - - -  Total Bilirubin 0.3 - 1.2 mg/dL - - -  Bilirubin, Direct 0.0 - 0.2 mg/dL - - -      Assessment:  #1.  Anemia.  Patient has received 2 units of PRBCs.  Hemoglobin is from 6.6 g to 10.9 g.  His stool was guaiac positive.  No history of overt GI bleed.  I suspect anemia is multifactorial and malnutrition may be playing a significant role.  #2.  Occult GI bleed.  Patient is on low-dose aspirin and recently begun on apixaban for atrial fibrillation..  Last dose was 4 days ago. Last colonoscopy was in February 2016 revealing  sigmoid diverticulosis.  Was a screening exam.  #3.  Hypocalcemia appears to be due to malnutrition.  He is receiving calcium supplements.  #4.  Malnutrition.  Patient lives alone and it does not appear that his oral intake is adequate.  This is result of history of alcohol abuse and drug abuse.  #5.  Pulmonary infiltrates.  Suspect pneumonia.  He is on ceftriaxone.   Plan:  Proceed with esophagogastroduodenoscopy followed by colonoscopy under monitored anesthesia care. I have reviewed procedures with the patient and he is agreeable.

## 2021-01-13 NOTE — Anesthesia Postprocedure Evaluation (Signed)
Anesthesia Post Note  Patient: Todd [name]  Procedure(s) Performed: ESOPHAGOGASTRODUODENOSCOPY (EGD) WITH PROPOFOL COLONOSCOPY WITH PROPOFOL BIOPSY POLYPECTOMY  Patient location during evaluation: PACU Anesthesia Type: General Level of consciousness: awake and alert and oriented Pain management: pain level controlled Vital Signs Assessment: post-procedure vital signs reviewed and stable Respiratory status: spontaneous breathing and respiratory function stable Cardiovascular status: blood pressure returned to baseline and stable Postop Assessment: no apparent nausea or vomiting Anesthetic complications: no   No notable events documented.   Last Vitals:  Vitals:   01/13/21 1332 01/13/21 1457  BP: (!) 157/93 (!) 156/95  Pulse:  77  Resp: 16 18  Temp:  36.6 C  SpO2: 98% 100%    Last Pain:  Vitals:   01/13/21 1457  TempSrc: Oral  PainSc:                  [name]

## 2021-01-13 NOTE — Progress Notes (Signed)
   01/13/21 1443 Provider Notification Provider Name/Title Dr. Josephine Cables Date Provider Notified 01/13/21 Time Provider Notified [phone] Notification Type Page Notification Reason Critical result Test performed and critical result calcium 5.7 Date Critical Result Received 01/13/21 Time Critical Result Received 0620

## 2021-01-13 NOTE — Transfer of Care (Signed)
Immediate Anesthesia Transfer of Care Note  Patient: Todd [name]  Procedure(s) Performed: ESOPHAGOGASTRODUODENOSCOPY (EGD) WITH PROPOFOL COLONOSCOPY WITH PROPOFOL BIOPSY POLYPECTOMY  Patient Location: PACU  Anesthesia Type:General  Level of Consciousness: drowsy  Airway & Oxygen Therapy: Patient Spontanous Breathing and Patient connected to nasal cannula oxygen  Post-op Assessment: Report given to RN and Post -op Vital signs reviewed and stable  Post vital signs: Reviewed and stable  Last Vitals:  Vitals Value Taken Time  BP    Temp    Pulse    Resp 29 01/13/21 1301  SpO2    Vitals shown include unvalidated device data.  Last Pain:  Vitals:   01/13/21 0852  TempSrc: Oral  PainSc: 0-No pain      Patients Stated Pain Goal: 5 (72/76/18 4859)  Complications: No notable events documented.

## 2021-01-13 NOTE — TOC Progression Note (Signed)
Transition of Care [facility]) - Progression Note    Patient Details  Name: [name] MRN: [id number] Date of Birth: [birth year]  Transition of Care [facility] CM/SW Contact  Shade Flood, LCSW Phone Number: 01/13/2021, 12:53 PM  Clinical Narrative:     TOC following. Spoke with APS CSW, [place], to update on dc planning. Her number is [phone]. Spoke with pt's sister, Sharyn Lull, in pt's room and pt's sister, [name], on speaker phone to update on dc planning. All questions answered.  Spoke with Jackelyn Poling at [place] who states that they do not yet have a response from [employer]. MD anticipating pt will be medically stable for dc tomorrow.  Will follow up in AM.  Expected Discharge Plan: [place] Barriers to Discharge: Continued Medical Work up  Expected Discharge Plan and Services Expected Discharge Plan: [place] In-house Referral: Clinical Social Work   Post Acute Care Choice: [place] Living arrangements for the past 2 months: Apartment                                       Social Determinants of Health (SDOH) Interventions    Readmission Risk Interventions No flowsheet data found.

## 2021-01-13 NOTE — Op Note (Addendum)
[facility] Patient Name: Todd [name] Procedure Date: 01/13/2021 11:14 AM MRN: [id number] Date of Birth: April 07, 1956 Attending MD: Hildred Laser , MD CSN: [id number] Age: 65 Admit Type: Inpatient Procedure:                Upper GI endoscopy Indications:              Iron deficiency anemia secondary to chronic blood                            loss Providers:                Hildred Laser, MD, Lurline Del, RN, Kristine L.                            Risa Grill, Technician Referring MD:             Irwin Brakeman, MD Medicines:                Propofol per Anesthesia Complications:            No immediate complications. Estimated Blood Loss:     Estimated blood loss was minimal. Procedure:                Pre-Anesthesia Assessment:                           - Prior to the procedure, a History and Physical                            was performed, and patient medications and                            allergies were reviewed. The patient's tolerance of                            previous anesthesia was also reviewed. The risks                            and benefits of the procedure and the sedation                            options and risks were discussed with the patient.                            All questions were answered, and informed consent                            was obtained. Prior Anticoagulants: The patient                            last took Eliquis (apixaban) 4 days prior to the                            procedure and has taken no previous anticoagulant  or antiplatelet agents except for aspirin. ASA                            Grade Assessment: IV - A patient with severe                            systemic disease that is a constant threat to life.                            After reviewing the risks and benefits, the patient                            was deemed in satisfactory condition to undergo the                             procedure.                           After obtaining informed consent, the endoscope was                            passed under direct vision. Throughout the                            procedure, the patient's blood pressure, pulse, and                            oxygen saturations were monitored continuously. The                            [id number]-[id number] ([id number]) scope was introduced through the                            mouth, and advanced to the second part of duodenum.                            The upper GI endoscopy was accomplished without                            difficulty. The patient tolerated the procedure                            well. Scope In: 11:44:05 AM Scope Out: 11:58:53 AM Total Procedure Duration: 0 hours 14 minutes 48 seconds  Findings:      The hypopharynx was normal.      Patchy, white plaques were found in the proximal esophagus. Cells for       cytology were obtained by brushing.      LA Grade B (one or more mucosal breaks greater than 5 mm, not extending       between the tops of two mucosal folds) esophagitis with no bleeding was       found 40 to 42 cm from the incisors. Biopsies were taken with a cold       forceps for histology. The pathology specimen was  placed into Bottle       Number 2.      The Z-line was irregular and was found 42 cm from the incisors.      Patchy mild inflammation characterized by congestion (edema), erythema       and friability was found in the gastric antrum and in the prepyloric       region of the stomach. Biopsies were taken with a cold forceps for       histology. The pathology specimen was placed into Bottle Number 1.      The exam of the stomach was otherwise normal.      Patchy mild inflammation characterized by congestion (edema), erosions       and erythema was found in the duodenal bulb.      The second portion of the duodenum was normal. Impression:               - Normal hypopharynx.                           -  Esophageal plaques were found, suspicious for                            candidiasis. Cells for cytology obtained.                           - LA Grade B esophagitis with no bleeding. Biopsied.                           - Z-line irregular, 42 cm from the incisors.                           - Gastritis. Biopsied.                           - Duodenitis.                           - Normal second portion of the duodenum. Moderate Sedation:      Per Anesthesia Care Recommendation:           - Patient has a contact number available for                            emergencies. The signs and symptoms of potential                            delayed complications were discussed with the                            patient. Return to normal activities tomorrow.                            Written discharge instructions were provided to the                            patient.                           -  Low sodium diet today.                           - Continue present medications.                           - Await pathology results.                           - See other procedure note. Procedure Code(s):        --- Professional ---                           [phone], Esophagogastroduodenoscopy, flexible,                            transoral; with biopsy, single or multiple Diagnosis Code(s):        --- Professional ---                           K22.9, Disease of esophagus, unspecified                           K20.90, Esophagitis, unspecified without bleeding                           K22.8, Other specified diseases of esophagus                           K29.70, Gastritis, unspecified, without bleeding                           K29.80, Duodenitis without bleeding                           D50.0, Iron deficiency anemia secondary to blood                            loss (chronic) CPT copyright 2019 American Medical Association. All rights reserved. The codes documented in this report are preliminary and  upon coder review may  be revised to meet current compliance requirements. Hildred Laser, MD Hildred Laser, MD 01/13/2021 1:08:27 PM This report has been signed electronically. Number of Addenda: 0

## 2021-01-13 NOTE — Anesthesia Procedure Notes (Signed)
Date/Time: 01/13/2021 11:49 AM Performed by: Orlie Dakin, CRNA Pre-anesthesia Checklist: Patient identified, Emergency Drugs available, Suction available and Patient being monitored Patient Re-evaluated:Patient Re-evaluated prior to induction Oxygen Delivery Method: Nasal cannula Induction Type: IV induction Placement Confirmation: positive ETCO2

## 2021-01-13 NOTE — Op Note (Signed)
[facility] Patient Name: Todd [name] Procedure Date: 01/13/2021 12:00 PM MRN: [id number] Date of Birth: 1956/06/07 Attending MD: Hildred Laser , MD CSN: [id number] Age: 65 Admit Type: Inpatient Procedure:                Colonoscopy Indications:              Iron deficiency anemia secondary to chronic blood                            loss Providers:                Hildred Laser, MD, Lurline Del, RN, Kristine L.                            Risa Grill, Technician Referring MD:             Irwin Brakeman, MD Medicines:                Propofol per Anesthesia Complications:            No immediate complications. Estimated Blood Loss:     Estimated blood loss was minimal. Procedure:                Pre-Anesthesia Assessment:                           - Prior to the procedure, a History and Physical                            was performed, and patient medications and                            allergies were reviewed. The patient's tolerance of                            previous anesthesia was also reviewed. The risks                            and benefits of the procedure and the sedation                            options and risks were discussed with the patient.                            All questions were answered, and informed consent                            was obtained. Prior Anticoagulants: The patient                            last took Eliquis (apixaban) 4 days prior to the                            procedure and has taken no previous anticoagulant  or antiplatelet agents except for aspirin. ASA                            Grade Assessment: IV - A patient with severe                            systemic disease that is a constant threat to life.                            After reviewing the risks and benefits, the patient                            was deemed in satisfactory condition to undergo the                            procedure.                            After obtaining informed consent, the colonoscope                            was passed under direct vision. Throughout the                            procedure, the patient's blood pressure, pulse, and                            oxygen saturations were monitored continuously. The                            [id number]-[id number] ([id number]) scope was introduced through                            the anus and advanced to the the cecum, identified                            by appendiceal orifice and ileocecal valve. The                            colonoscopy was performed without difficulty. The                            patient tolerated the procedure well. The quality                            of the bowel preparation was adequate. The                            ileocecal valve, appendiceal orifice, and rectum                            were photographed. Scope In: 12:07:39 PM Scope Out: 12:52:34 PM Scope Withdrawal Time: 0 hours 36 minutes 53 seconds  Total Procedure Duration: 0 hours 44  minutes 55 seconds  Findings:      The digital rectal exam findings include decreased sphincter tone.      Multiple petechiae were found in the ascending colon and in the cecum.      Two polyps were found in the splenic flexure. The polyps were small in       size. These polyps were removed with a cold snare. Resection and       retrieval were complete. The pathology specimen was placed into Bottle       Number 3.      A 8 mm polyp was found in the sigmoid colon. The polyp was       semi-pedunculated. The polyp was removed with a hot snare. Resection and       retrieval were complete. The pathology specimen was placed into Bottle       Number 3.      Scattered diverticula were found in the sigmoid colon.      An area of congested mucosa was found in the rectum. Impression:               - Decreased sphincter tone found on digital rectal                            exam.                            - Petechia(e) in the ascending colon and in the                            cecum.                           - Two small polyps at the splenic flexure, removed                            with a cold snare. Resected and retrieved.                           - One 8 mm polyp in the sigmoid colon, removed with                            a hot snare. Resected and retrieved.                           - Diverticulosis in the sigmoid colon.                           - Congested mucosa in the rectum. Moderate Sedation:      Per Anesthesia Care Recommendation:           - Low sodium diet today.                           - Continue present medications.                           - Resume Eliquis (apixaban) at prior dose in 3 days.                           -  Await pathology results.                           - Repeat colonoscopy is recommended. The                            colonoscopy date will be determined after pathology                            results from today's exam become available for                            review. Procedure Code(s):        --- Professional ---                           [phone], Colonoscopy, flexible; with removal of                            tumor(s), polyp(s), or other lesion(s) by snare                            technique Diagnosis Code(s):        --- Professional ---                           K62.89, Other specified diseases of anus and rectum                           K63.89, Other specified diseases of intestine                           K63.5, Polyp of colon                           D50.0, Iron deficiency anemia secondary to blood                            loss (chronic)                           K57.30, Diverticulosis of large intestine without                            perforation or abscess without bleeding CPT copyright 2019 American Medical Association. All rights reserved. The codes documented in this report are preliminary and upon coder  review may  be revised to meet current compliance requirements. Hildred Laser, MD Hildred Laser, MD 01/13/2021 1:14:26 PM This report has been signed electronically. Number of Addenda: 0

## 2021-01-13 NOTE — Progress Notes (Signed)
Brief EGD and colonoscopy findings   EGD  Patchy whitish material coating mucosa of proximal esophagus.  Brushing taken for KOH prep. Grade B esophagitis involving distal 2 cm of esophageal mucosa.  Biopsy taken.  Bottle #1.   GE junction at 42 cm from the incisors. Nonerosive antral gastritis and erosive bulbar duodenitis. Antral biopsy taken for routine histology.  Bowel #2.   Colonoscopy.  Prep adequate. Examination performed to cecum. 2 small polyps cold snared from splenic flexure and placed in bottle #3. 8 mm polyp hot snare from sigmoid colon and placed in bottle #3. Sigmoid colon diverticulosis Edema to rectal mucosa. Diminished anal sphincter tone.

## 2021-01-14 DIAGNOSIS — F1010 Alcohol abuse, uncomplicated: Secondary | ICD-10-CM

## 2021-01-14 DIAGNOSIS — B3781 Candidal esophagitis: Secondary | ICD-10-CM

## 2021-01-14 DIAGNOSIS — R531 Weakness: Secondary | ICD-10-CM

## 2021-01-14 LAB — GLUCOSE, CAPILLARY
Glucose-Capillary: 116 mg/dL — ABNORMAL HIGH (ref 70–99)
Glucose-Capillary: 121 mg/dL — ABNORMAL HIGH (ref 70–99)
Glucose-Capillary: 118 mg/dL — ABNORMAL HIGH (ref 70–99)
Glucose-Capillary: 166 mg/dL — ABNORMAL HIGH (ref 70–99)

## 2021-01-14 LAB — URINE CULTURE: Culture: 50000 — AB

## 2021-01-14 MED ORDER — SODIUM CHLORIDE 0.9 % IV SOLN
500.0000 mg | Freq: Three times a day (TID) | INTRAVENOUS | Status: DC
  Administered 2021-01-14 – 2021-01-15 (×3): 500 mg via INTRAVENOUS
  Filled 2021-01-14 (×13): qty 0.5

## 2021-01-14 MED ORDER — FLUCONAZOLE 100 MG PO TABS
200.0000 mg | ORAL_TABLET | Freq: Every day | ORAL | Status: DC
  Administered 2021-01-15: 200 mg via ORAL
  Filled 2021-01-14: qty 2

## 2021-01-14 MED ORDER — FLUCONAZOLE 150 MG PO TABS
400.0000 mg | ORAL_TABLET | Freq: Once | ORAL | Status: AC
  Administered 2021-01-14: 400 mg via ORAL
  Filled 2021-01-14: qty 1

## 2021-01-14 NOTE — Progress Notes (Signed)
Pharmacy Antibiotic Note  Todd [name] is a 65 y.o. male admitted on 01/10/2021 with UTI.  Pharmacy has been consulted for Merrem dosing.  Plan: Merrem 500mg  IV q8h F/U cxs and clinical progress Monitor V/S, labs  Height: 5\' 8"  (172.7 cm) (prior to BKA) Weight: 59.1 kg (130 lb 4.7 oz) IBW/kg (Calculated) : 68.4  Temp (24hrs), Avg:98 F (36.7 C), Min:97.8 F (36.6 C), Max:98.3 F (36.8 C)  Recent Labs  Lab 01/10/21 0847 01/11/21 0509 01/12/21 0509 01/13/21 0548  WBC 13.2* 11.0* 12.5* 13.8*  CREATININE 0.93 1.23 1.22 1.19    Estimated Creatinine Clearance: 51.7 mL/min (by C-G formula based on SCr of 1.19 mg/dL).    Allergies  Allergen Reactions   Lisinopril Other (See Comments)    Hyperkalemia / Renal failure    Antimicrobials this admission: Ceftriaxone  8/1>> 8/4 Merrem 8/4 >>   Microbiology results: 8/1 UCX: 50K CFU/ml ESBL E.Coli s- imipenem, septra R- cipro, ceftriaxone   Thank you for allowing pharmacy to be a part of this patient's care.  Isac Sarna, BS Pharm D, BCPS Clinical Pharmacist Pager [phone] 01/14/2021 9:12 AM

## 2021-01-14 NOTE — Progress Notes (Signed)
Physical Therapy Treatment Patient Details Name: Todd [name] MRN: [id number] DOB: [birth year] Today's Date: 01/14/2021    History of Present Illness According to the patient's sister he has been extremely weak for a number days.  She came up to help him last week and he was covered in feces and was more confused than he normally was.  Patient has a history of atrial fib also    PT Comments     Pt refused initially.  Family member present and able to convince participation.  Spent most of session educating on importance of core and UE strength.  Pt will need a wheelchair and sliding board in his room to work on transfers (acute PT notified)  pt required mod assist from therapist to sit up in bed, verbal and tactile cues to use UE's to support self and engage core.  Pt able to establish and maintain this position following instructions.  Educated to complete this during the day and push up with his UE's.  Follow Up Recommendations  SNF;Supervision - Intermittent     Equipment Recommendations  3in1 (PT);Other (comment)    Recommendations for Other Services       Precautions / Restrictions Precautions Precautions: Fall    Mobility  Bed Mobility Overal bed mobility: Needs Assistance Bed Mobility: Supine to Sit Rolling: Min assist   Supine to sit: Mod assist     General bed mobility comments: to sit up in bed required mod assist and verbal cues from therapist       Balance   Sitting-balance support: Bilateral upper extremity supported   Sitting balance - Comments: able to maintain with use of bedrails or propping self up with UE's                                    Cognition Arousal/Alertness: Awake/alert Behavior During Therapy: WFL for tasks assessed/performed Overall Cognitive Status: Within Functional Limits for tasks assessed                                 General Comments: Requires encouragment for participation      Exercises  General Exercises - Upper Extremity Chair Push Up: Both;5 reps (pushing up in bed with UE"s)    General Comments        Pertinent Vitals/Pain Pain Assessment: No/denies pain    Home Living                      Prior Function BIL LE amputee; has not used his prosthesis and typically completes sliding board transfers to wheelchair         PT Goals (current goals can now be found in the care plan section) Progress towards PT goals: Progressing toward goals    Frequency    Min 3X/week      PT Plan   Increase UE and core strength and return to sliding board transfers to wheelchair.                  AM-PAC PT "6 Clicks" Mobility   Outcome Measure  Help needed turning from your back to your side while in a flat bed without using bedrails?: A Lot Help needed moving from lying on your back to sitting on the side of a flat bed without using bedrails?: Total Help needed moving to and from a  bed to a chair (including a wheelchair)?: Total Help needed standing up from a chair using your arms (e.g., wheelchair or bedside chair)?: Total Help needed to walk in hospital room?: Total Help needed climbing 3-5 steps with a railing? : Total 6 Click Score: 7    End of Session   Activity Tolerance: Patient limited by fatigue Patient left: in bed;with family/visitor present   PT Visit Diagnosis: Muscle weakness (generalized) (M62.81);History of falling (Z91.81)     Time: 5573-2202 PT Time Calculation (min) (ACUTE ONLY): 14 min  Charges:  $Self Care/Home Management: [place], PTA/CLT [phone]   Roseanne Reno B 01/14/2021, 3:34 PM

## 2021-01-14 NOTE — Progress Notes (Signed)
Palliative: [name], [name], is lying quietly in bed.  He greets me making and somewhat keeping eye contact.  He appears acutely/chronically ill and frail, older than stated age.  He is oriented to self only at this time.  He denies issues or concerns.  We talked about healthcare power of attorney, he again today names [name].  We briefly discussed CODE STATUS and Todd [name] shares that "Todd [name] will take care of it".  He has no issues or concerns at this time.  Call to sister, Todd [name] who gets their sister, Todd [name], on the line also. [name] lives in [place], was not present for conversation.  We talked about Todd [name]'s acute and chronic health concerns, including his substance abuse.  We talked about GI consult with EGD/colonoscopy, some samples and polyps taken for testing, awaiting results.  Todd [name] asks when we will Todd [name] to start getting better, put on weight?  Again, we talked about his chronic illness burden, his poor functional status, his substance abuse.  Todd [name] states that she cared for him for about 10 days before this admit and he had been weak at that time, not eating and drinking much, sometimes neglecting himself.  We talked about outpatient palliative services, and extra layer of support.  At this point Todd [name] is agreeable to outpatient palliative services.  Provider choice offered, she is agreeable to hospice of [facility].  She asks several times for me to leave information in Todd [name]'s room, but I share with her that I do not work for that company, we will make the referral and they will reach out to her.  Conference with attending, bedside nursing staff, transition of care team related to patient condition, needs, goals of care, disposition.  Plan:  Continue to treat the treatable, short term rehab at [facility], Outpatient palliative with [facility] contact [name] [phone] [name].   65 minutes, extended  time  Quinn Axe, NP Palliative Medicine Team  Team Phone [phone] Greater than 50% of this time was spent counseling and coordinating care related to the above assessment and plan.

## 2021-01-14 NOTE — Care Management Important Message (Deleted)
Important Message  Patient Details  Name: Todd [name] MRN: [id number] Date of Birth: 04/05/1956   Medicare Important Message Given:  Yes     [name] 01/14/2021, 11:55 AM

## 2021-01-14 NOTE — TOC Progression Note (Signed)
Transition of Care [facility]) - Progression Note    Patient Details  Name: Pius Byrom MRN: [id number] Date of Birth: [birth year]  Transition of Care [facility]) CM/SW Contact  Shade Flood, LCSW Phone Number: 01/14/2021, 4:35 PM  Clinical Narrative:     TOC following. Awaiting insurance determination on auth request for SNF rehab. Debbie at [place] informed this LCSW that [facility] was requesting an updated PT note. This was completed and Debbie submitted it to [facility]. Anticipating determination tomorrow.  Updated pt's sister, [name]. Will follow up in AM.  Expected Discharge Plan: [place] Barriers to Discharge: Continued Medical Work up  Expected Discharge Plan and Services Expected Discharge Plan: [place] In-house Referral: Clinical Social Work   Post Acute Care Choice: [place] Living arrangements for the past 2 months: Apartment                                       Social Determinants of Health (SDOH) Interventions    Readmission Risk Interventions No flowsheet data found.

## 2021-01-14 NOTE — Progress Notes (Signed)
Subjective:  No complaints. Says he is hungry and wants potato chips.   Objective: Vital signs in last 24 hours: Temp:  [97.8 F (36.6 C)-98.3 F (36.8 C)] 97.8 F (36.6 C) (08/04 0523) Pulse Rate:  [77-98] 89 (08/04 0757) Resp:  [16-22] 18 (08/03 1457) BP: (116-169)/(74-98) 169/88 (08/04 0757) SpO2:  [97 %-100 %] 100 % (08/04 0757) Last BM Date: 01/12/21 General:   Alert,  Well-developed, well-nourished, pleasant and cooperative in NAD Head:  Normocephalic and atraumatic. Eyes:  Sclera clear, no icterus.   Abdomen:  Soft, nontender and nondistended.  Normal bowel sounds, without guarding, and without rebound.   Extremities:  bilateral AKA. Neurologic:  Alert and  oriented x4;  grossly normal neurologically. Skin:  Intact without significant lesions or rashes. Psych:  Alert and cooperative. Normal mood and affect.  Intake/Output from previous day: 08/03 0701 - 08/04 0700 In: 1480 [P.O.:680; I.V.:800] Out: 600 [Urine:600] Intake/Output this shift: Total I/O In: 480.1 [I.V.:380.1; IV Piggyback:100] Out: 100 [Urine:100]  Lab Results: CBC Recent Labs    01/12/21 0509 01/13/21 0548  WBC 12.5* 13.8*  HGB 6.6* 10.9*  HCT 21.4* 32.8*  MCV 84.9 83.2  PLT 424* 442*   BMET Recent Labs    01/12/21 0509 01/13/21 0548  NA 143 137  K 3.7 4.6  CL 114* 105  CO2 22 22  GLUCOSE 122* 88  BUN 16 10  CREATININE 1.22 1.19  CALCIUM 5.2* 5.7*   LFTs Recent Labs    01/12/21 0509 01/13/21 0548  ALBUMIN 1.6* 1.8*   No results for input(s): LIPASE in the last 72 hours. PT/INR No results for input(s): LABPROT, INR in the last 72 hours.    Imaging Studies: CT HEAD WO CONTRAST  Result Date: 01/10/2021 CLINICAL DATA:  Mental status change, unknown cause. Additional history provided: Generalized weakness. EXAM: CT HEAD WITHOUT CONTRAST TECHNIQUE: Contiguous axial images were obtained from the base of the skull through the vertex without intravenous contrast. COMPARISON:  Prior  head CT examinations 12/21/2020 and earlier. FINDINGS: Brain: Mild-to-moderate generalized cerebral atrophy. Comparatively mild cerebellar atrophy. Redemonstrated chronic lacunar infarcts within the bilateral basal ganglia. Mild patchy and ill-defined hypoattenuation within the cerebral white matter, nonspecific but compatible with chronic small vessel ischemic disease. Redemonstrated chronic infarct within the right cerebellar hemisphere. There is no acute intracranial hemorrhage. No demarcated cortical infarct. No extra-axial fluid collection. No evidence of an intracranial mass. No midline shift. Vascular: No hyperdense vessel.  Atherosclerotic calcifications. Skull: Unchanged nonspecific sclerotic thickening of the outer table of the left parietal calvarium, benign in appearance. Sinuses/Orbits: Visualized orbits show no acute finding. No significant paranasal sinus disease. IMPRESSION: No evidence of acute intracranial abnormality. Redemonstrated chronic lacunar infarcts within the bilateral basal ganglia. Mild chronic small vessel ischemic changes within the cerebral white matter. Redemonstrated chronic infarct in the right cerebellar hemisphere. Electronically Signed   By: Kellie Simmering DO   On: 01/10/2021 17:40   DG CHEST PORT 1 VIEW  Result Date: 01/10/2021 CLINICAL DATA:  Nonproductive cough. Diabetes and hypertension. EXAM: PORTABLE CHEST 1 VIEW COMPARISON:  04/28/2021 FINDINGS: The heart size and mediastinal contours are within normal limits. New diffuse interstitial infiltrates are seen, suspicious for interstitial edema. No evidence of pulmonary airspace disease or pleural effusion. Multiple surgical clips again seen in the area the thoracic inlet and lower neck. IMPRESSION: New diffuse interstitial infiltrates, suspicious for interstitial edema. Electronically Signed   By: Marlaine Hind M.D.   On: 01/10/2021 19:47   DG Chest Portable  1 View  Result Date: 12/26/2020 CLINICAL DATA:  Weakness and  syncopal episode yesterday. EXAM: PORTABLE CHEST 1 VIEW COMPARISON:  November 21, 2020 FINDINGS: Tortuosity and calcific atherosclerotic disease of the aorta. Postsurgical changes at the thoracic inlet. Cardiomediastinal silhouette is normal. Mediastinal contours appear intact. There is no evidence of pleural effusion or pneumothorax. Streaky airspace opacities in the right lung base. Osseous structures are without acute abnormality. Soft tissues are grossly normal. IMPRESSION: Streaky airspace opacities in the right lung base, which may represent atelectasis or airspace consolidation. Electronically Signed   By: Fidela Salisbury M.D.   On: 12/26/2020 16:13   ECHOCARDIOGRAM COMPLETE  Result Date: 12/27/2020    ECHOCARDIOGRAM REPORT   Patient Name:   Todd [name] Date of Exam: 12/27/2020 Medical Rec #:  [id number]       Height:       68.0 in Accession #:    [id number]      Weight:       128.3 lb Date of Birth:  1956/01/13      BSA:          1.692 m Patient Age:    1 years        BP:           108/64 mmHg Patient Gender: M               HR:           71 bpm. Exam Location:  [facility] Procedure: 2D Echo, Cardiac Doppler, Color Doppler and Intracardiac            Opacification Agent Indications:    I48.0 Paroxysmal atrial fibrillation  History:        Patient has prior history of Echocardiogram examinations, most                 recent 02/13/2018. Risk Factors:Hypertension, Diabetes and                 Dyslipidemia. Thyroid Disease. Sepsis.  Sonographer:    Jonelle Sidle Dance Referring Phys: [id number] JACOB J STINSON  Sonographer Comments: Technically difficult study due to poor echo windows. Image acquisition challenging due to respiratory motion. IMPRESSIONS  1. Left ventricular ejection fraction, by estimation, is 55 to 60%. The left ventricle has normal function. The left ventricle has no regional wall motion abnormalities. There is mild concentric left ventricular hypertrophy. Left ventricular diastolic parameters are  consistent with Grade I diastolic dysfunction (impaired relaxation).  2. Right ventricular systolic function is normal. The right ventricular size is normal. Tricuspid regurgitation signal is inadequate for assessing PA pressure.  3. The mitral valve is grossly normal. No evidence of mitral valve regurgitation. No evidence of mitral stenosis.  4. The aortic valve is tricuspid. Aortic valve regurgitation is not visualized. No aortic stenosis is present.  5. The inferior vena cava is normal in size with greater than 50% respiratory variability, suggesting right atrial pressure of 3 mmHg. Comparison(s): No significant change from prior study. FINDINGS  Left Ventricle: Left ventricular ejection fraction, by estimation, is 55 to 60%. The left ventricle has normal function. The left ventricle has no regional wall motion abnormalities. Definity contrast agent was given IV to delineate the left ventricular  endocardial borders. The left ventricular internal cavity size was normal in size. There is mild concentric left ventricular hypertrophy. Left ventricular diastolic parameters are consistent with Grade I diastolic dysfunction (impaired relaxation). Right Ventricle: The right ventricular size is normal. No increase in right ventricular  wall thickness. Right ventricular systolic function is normal. Tricuspid regurgitation signal is inadequate for assessing PA pressure. Left Atrium: Left atrial size was normal in size. Right Atrium: Right atrial size was normal in size. Pericardium: Trivial pericardial effusion is present. Presence of pericardial fat pad. Mitral Valve: The mitral valve is grossly normal. No evidence of mitral valve regurgitation. No evidence of mitral valve stenosis. Tricuspid Valve: The tricuspid valve is grossly normal. Tricuspid valve regurgitation is trivial. No evidence of tricuspid stenosis. Aortic Valve: The aortic valve is tricuspid. Aortic valve regurgitation is not visualized. No aortic stenosis  is present. Pulmonic Valve: The pulmonic valve was not well visualized. Pulmonic valve regurgitation is not visualized. Aorta: The aortic root and ascending aorta are structurally normal, with no evidence of dilitation. Venous: The inferior vena cava is normal in size with greater than 50% respiratory variability, suggesting right atrial pressure of 3 mmHg. IAS/Shunts: The atrial septum is grossly normal.  LEFT VENTRICLE PLAX 2D LVIDd:         4.02 cm  Diastology LVIDs:         2.98 cm  LV e' medial:   4.64 cm/s LV PW:         1.40 cm  LV E/e' medial: 8.4 LV IVS:        1.31 cm LVOT diam:     2.00 cm LV SV:         30 LV SV Index:   17 LVOT Area:     3.14 cm  RIGHT VENTRICLE RV Basal diam:  2.52 cm TAPSE (M-mode): 1.9 cm LEFT ATRIUM             Index       RIGHT ATRIUM           Index LA diam:        3.10 cm 1.83 cm/m  RA Area:     14.80 cm LA Vol (A2C):   59.7 ml 35.28 ml/m RA Volume:   36.70 ml  21.69 ml/m LA Vol (A4C):   37.6 ml 22.22 ml/m LA Biplane Vol: 47.3 ml 27.95 ml/m  AORTIC VALVE LVOT Vmax:   48.80 cm/s LVOT Vmean:  33.600 cm/s LVOT VTI:    0.094 m  AORTA Ao Root diam: 3.30 cm Ao Asc diam:  3.20 cm MITRAL VALVE MV Area (PHT): 2.48 cm    SHUNTS MV Decel Time: 306 msec    Systemic VTI:  0.09 m MV E velocity: 38.80 cm/s  Systemic Diam: 2.00 cm MV A velocity: 42.70 cm/s MV E/A ratio:  0.91 Eleonore Chiquito MD Electronically signed by Eleonore Chiquito MD Signature Date/Time: 12/27/2020/3:39:42 PM    Final   [2 weeks]   Assessment: 65 year old male with diabetes, A. fib on Eliquis (started in July), hypertension, hypothyroidism, bilateral AKA, polysubstance abuse including alcohol/marijuana/tobacco/cocaine, medical noncompliance admitted July 31 with generalized weakness, A. fib with RVR, multiple electrolyte derangements, anemia with low serum iron, UTI.  GI consulted for further evaluation of anemia.  Anemia with heme positive stool: Hemoglobin 7.8 on admission, down from 9.9 two weeks ago.  Hemoglobin  10.2 in June 2022, previously 13.5 in August 2021.  Anemia panel most consistent with anemia of chronic disease, with elevated ferritin at 352, iron sats 21%, serum iron low at 30, TIBC low at 145.  B12 and folate normal.  Patient denies overt GI bleeding.  No chronic NSAID use.  Hemoglobin dropped to 6.6 this admission.  Required 2 units of packed red blood cells.  Hemoglobin today 10.9.  EGD August 3: LA grade B esophagitis with no bleeding.  Gastritis.  Duodenitis.  Esophageal plaques concerning for candidiasis.  KOH prep showed few yeast.  Colonoscopy August 3: Petechiae in the ascending colon and cecum.  2 small polyps removed from the splenic flexure.  8 mm polyp removed from the sigmoid colon.  Diverticulosis.  Congested mucosa noted in the rectum.   Plan: Can resume Eliquis August 6. Await pathology. Diflucan 400mg  day 1, then 200mg  daily for 20 days. Monitor H/H and for further overt GI bleeding.  Continue Pantoprazole BID.  Follow up with [place] outpatient. Will sign off. Call with any questions.   Laureen Ochs. Bernarda Caffey [facility] [phone] 8/4/20221:55 PM    LOS: 4 days

## 2021-01-14 NOTE — Progress Notes (Signed)
Patient Demographics:    Todd [name], is a 65 y.o. male, DOB - [birth year], HFS:[id number]  Admit date - 01/10/2021   Admitting Physician Courage Denton Brick, MD  Outpatient Primary MD for the patient is Todd Sender, FNP  LOS - 4   Chief Complaint  Patient presents with   Weakness        Subjective:    Todd [name] tolerated his EGD and colonoscopy today and now tolerating his diet.    Assessment  & Plan :    Principal Problem:   Atrial fibrillation with RVR (HCC) Active Problems:   Hypothyroidism   Hypokalemia   Hypocalcemia   Cocaine abuse -on-going   Alcohol abuse, continuous   Diabetes mellitus type 2 with complications (HCC)   Tobacco abuse   Hypomagnesemia   Iron deficiency anemia due to chronic blood loss   Blood in stool   Brief Summary:- 65 year old with history of polysubstance abuse including alcohol tobacco and  almost daily cocaine use who is notoriously noncompliant with medications admitted on 01/10/2021 with concerns for generalized weakness and A. fib with RVR in the setting of severely abnormal thyroid and severe electrolyte derangement including hypokalemia, hypomagnesemia and hypocalcemia, patient found to have symptomatic anemia with hemoglobin around 6.6 -Patient will go to [facility] SNF once GI work-up is completed   A/P  1)Chronic A. fib with AVR---most likely related to severe electrolyte derangement including hypophosphatemia, hypocalcemia and severely abnormal thyroid -EKG with A. fib and heart rate up to 153 Magnesium 1.2 >> 2.4  --Calcium is 5.1 with an albumin of 1.6 so corrected calcium is around 7 -Potassium is 2.7 >> 3.7 -echocardiogram without significant aortic stenosis or other outflow obstruction, EF is 55 to 60%, and also without  segmental/Regional wall motion abnormalities. Hold Eliquis due to heme positive stool with drop in  H&H -Continue metoprolol for rate control,  -UDS negative for cocaine  2) Severe hypothyroidism--patient is notoriously noncompliant, TSH  189.8 on 12/26/20  - repeat TSH is Now 0.805 -Free T4 was 0.56 on 12/26/20 and Now 1.78-low at 0.5, will repeat -Decreased levothyroxine to 100 mcg from 150 -follow-up with endocrinologist Dr. Dorris Fetch in about 3 weeks for repeat TSH and free T4 advised   3)Acute on Chronic symptomatic anemia-- Hgb 7.8 >>6.7>>6.6  baseline Hgb usually around 9 -Stopped Eliquis Stool occult blood is positive -Continue Protonix -GI consult requested -B12 and folate not low -Serum iron is low at 30, TIBC 145 -Transfuse 2 units of PRBC with Lasix in between on 01/12/21 -GI plans EGD and colonoscopy on 01/13/2021 with findings:  EGD Patchy whitish material coating mucosa of proximal esophagus.  Brushing taken for KOH prep. Grade B esophagitis involving distal 2 cm of esophageal mucosa.  Biopsy taken.  Bottle #1.   GE junction at 42 cm from the incisors. Nonerosive antral gastritis and erosive bulbar duodenitis. Antral biopsy taken for routine histology.  Bottle #2.   Colonoscopy Prep adequate. Examination performed to cecum. 2 small polyps cold snared from splenic flexure and placed in bottle #3. 8 mm polyp hot snare from sigmoid colon and placed in bottle #3. Sigmoid colon diverticulosis Edema to rectal mucosa. Diminished anal sphincter tone.   4)Hypophosphatemia, hypomagnesemia and  hypocalcemia and hypokalemia----severe electrolyte  derangement and severely abnormal thyroid probably contributing to patient's arrhythmias -Patient recently had episodes of nonsustained V. Tach during recent hospitalization -Electrolytes as above #1, continue to replace electrolytes including calcium and potassium thyroid function as above #2 -No further arrhythmias at this time   5)DM2-A1c 6.0 reflecting excellent diabetic control PTA, but patient sugars are probably not elevated also  because patient really does not eat much he drinks a lot of alcohol and just does not eat much ---Use Novolog/Humalog Sliding scale insulin with Accu-Cheks/Fingersticks as ordered   6)Polysubstance abuse + Etoh Abuse--patient uses alcohol, tobacco and cocaine almost on a daily basis -lorazepam per CIWA protocol for DT prophylaxis, thiamine and folic acid as ordered --Pt recently refused inpatient rehab for drugs and alcohol --Social worker was able to make arrangements for patient to hopefully go to [facility] as outpatient and possibly get admitted to [facility] in [place] for inpatient rehab -Please see social worker documentation dated 12/29/2020 from [place] is negative this admission    7)Generalized weakness and deconditioning--- most likely related to severe electrolyte abnormalities including severe hypophosphatemia, hypocalcemia and hypokalemia as well as severely abnormal thyroid -Replace electrolytes as above in #4 -Patient evaluated by physical therapist on 01/10/2021 recommends SNF rehab -CT head without contrast with evidence of old strokes, but no new acute infarcts -  8)Anorexia/FTT--- multifactorial, resulting in severe electrolyte abnormalities, -Megace for appetite stimulation as ordered -Okay to take multivitamin and nutritional supplements   9)Social/Ethics--patient technically lives alone in an apartment,  -Typically uses cocaine and alcohol and tobacco on a daily basis -Recently has had multiple hospitalizations to [facility] on [facility] noncompliant -High risk for decompensation and death -Plan of care discussed with patient's sisters [name] and Scientist, physiological by phone -Patient is a full code   10)Leukocytosis--  -Chest x-ray without pneumonia, -UA suspicious for possible UTI -IV Rocephin as ordered for possible UTI pending culture data   11)H/o old CVAs--- chronic lacunar infarcts within the bilateral basal Ganglia, Mild  chronic small vessel ischemic changes within the cerebral white matter.  -chronic infarct in the right cerebellar hemisphere. ??? Dementia related to old CVAs and EToh Abuse   Esophageal candidiasis  - ESOPHAGEAL BRUSHINGS POSITIVE FOR YEAST.  - Pt was started on oral fluconazole 400 mg x 1 followed by 200 mg daily x 20 days.     Disposition/Need for in-Hospital Stay- AWAITING INSURANCE AUTHORIZATION FOR SNF   Status is: Inpatient   Remains inpatient appropriate because: Please see disposition above   Dispo: The patient is from: Home              Anticipated d/c is to: SNF -- [facility] once GI work-up is completed              Anticipated d/c date is: 2 days              Patient currently is not medically stable to d/c. Barriers: Not Clinically Stable-    Code Status : -  Code Status: Full Code    Family Communication:    (patient is alert, awake and coherent) -Plan of care discussed with patient's sisters  [name] and [name] and [name]  Consults  :  Gi  DVT Prophylaxis  :   - SCDs       Lab Results  Component Value Date   PLT 442 (H) 01/13/2021    Inpatient Medications  Scheduled Meds:  allopurinol  100 mg Oral Daily   atorvastatin  40 mg Oral q1800   calcium-vitamin D  2 tablet Oral BID   [START ON 01/15/2021] fluconazole  200 mg Oral Daily   folic acid  1 mg Oral Daily   Gerhardt's butt cream   Topical TID   insulin aspart  0-5 Units Subcutaneous QHS   insulin aspart  0-9 Units Subcutaneous TID WC   insulin aspart  3 Units Subcutaneous TID WC   levothyroxine  100 mcg Oral Q0600   megestrol  400 mg Oral BID   metoprolol succinate  25 mg Oral Daily   multivitamin with minerals  1 tablet Oral Daily   pantoprazole  40 mg Oral BID   polyethylene glycol  17 g Oral Daily   sertraline  50 mg Oral QHS   silver sulfADIAZINE  1 application Topical Daily   sodium bicarbonate  650 mg Oral TID   sodium chloride flush  3 mL Intravenous Q12H   thiamine  100 mg Oral Daily    Continuous Infusions:  sodium chloride     dextrose 5 % and 0.45 % NaCl with KCl 20 mEq/L Stopped (01/14/21 1014)   meropenem (MERREM) IV Stopped (01/14/21 1047)   PRN Meds:.sodium chloride, acetaminophen **OR** acetaminophen, bisacodyl, diltiazem, ondansetron **OR** ondansetron (ZOFRAN) IV, sodium chloride flush, traZODone    Anti-infectives (From admission, onward)    Start     Dose/Rate Route Frequency Ordered Stop   01/15/21 1000  fluconazole (DIFLUCAN) tablet 200 mg        200 mg Oral Daily 01/14/21 1012 02/04/21 0959   01/14/21 1100  fluconazole (DIFLUCAN) tablet 400 mg        400 mg Oral  Once 01/14/21 1012 01/14/21 1212   01/14/21 1015  meropenem (MERREM) 500 mg in sodium chloride 0.9 % 100 mL IVPB        500 mg 200 mL/hr over 30 Minutes Intravenous Every 8 hours 01/14/21 0918     01/11/21 1145  cefTRIAXone (ROCEPHIN) 1 g in sodium chloride 0.9 % 100 mL IVPB  Status:  Discontinued        1 g 200 mL/hr over 30 Minutes Intravenous Every 24 hours 01/11/21 1053 01/14/21 0851         Objective:   Vitals:   01/13/21 2009 01/14/21 0523 01/14/21 0757 01/14/21 1317  BP: 127/74 (!) 158/95 (!) 169/88 (!) 159/81  Pulse: 98 91 89 84  Resp:    20  Temp: 98.3 F (36.8 C) 97.8 F (36.6 C)  98.2 F (36.8 C)  TempSrc: Oral Oral  Oral  SpO2: 100% 100% 100% 100%  Weight:      Height:        Wt Readings from Last 3 Encounters:  01/10/21 59.1 kg  12/26/20 58.2 kg  12/05/20 55.7 kg   Intake/Output Summary (Last 24 hours) at 01/14/2021 1638 Last data filed at 01/14/2021 1633 Gross per 24 hour  Intake 1743.04 ml  Output 900 ml  Net 843.04 ml   Physical Exam  Physical Examination: General appearance - alert, chronically ill appearing, and in no distress Mental status - alert, oriented to person, place, and time,  patient continues to have intermittent episodes of disorientation or confusion Eyes - sclera anicteric Neck - supple, no JVD elevation , Chest -somewhat  diminished, no wheezing  heart - S1 and S2 normal, irregularly irregular  abdomen - soft, nontender, nondistended, no masses or organomegaly Neurological -generalized weakness, occasional episodes of disorientation and confusion skin - warm, dry MSK-bilateral AKA  Data Review:   Micro Results Recent Results (from the past 240 hour(s))  Resp Panel by RT-PCR (Flu A&B, Covid) Nasopharyngeal Swab     Status: None   Collection Time: 01/10/21  3:21 PM   Specimen: Nasopharyngeal Swab; Nasopharyngeal(NP) swabs in vial transport medium  Result Value Ref Range Status   SARS Coronavirus 2 by RT PCR NEGATIVE NEGATIVE Final    Comment: (NOTE) SARS-CoV-2 target nucleic acids are NOT DETECTED.  The SARS-CoV-2 RNA is generally detectable in upper respiratory specimens during the acute phase of infection. The lowest concentration of SARS-CoV-2 viral copies this assay can detect is 138 copies/mL. A negative result does not preclude SARS-Cov-2 infection and should not be used as the sole basis for treatment or other patient management decisions. A negative result may occur with  improper specimen collection/handling, submission of specimen other than nasopharyngeal swab, presence of viral mutation(s) within the areas targeted by this assay, and inadequate number of viral copies(<138 copies/mL). A negative result must be combined with clinical observations, patient history, and epidemiological information. The expected result is Negative.  Fact Sheet for Patients:  EntrepreneurPulse.com.au  Fact Sheet for Healthcare Providers:  IncredibleEmployment.be  This test is no t yet approved or cleared by the [place] FDA and  has been authorized for detection and/or diagnosis of SARS-CoV-2 by FDA under an Emergency Use Authorization (EUA). This EUA will remain  in effect (meaning this test can be used) for the duration of the COVID-19 declaration under Section  564(b)(1) of the Act, 21 U.S.C.section 360bbb-3(b)(1), unless the authorization is terminated  or revoked sooner.       Influenza A by PCR NEGATIVE NEGATIVE Final   Influenza B by PCR NEGATIVE NEGATIVE Final    Comment: (NOTE) The Xpert Xpress SARS-CoV-2/FLU/RSV plus assay is intended as an aid in the diagnosis of influenza from Nasopharyngeal swab specimens and should not be used as a sole basis for treatment. Nasal washings and aspirates are unacceptable for Xpert Xpress SARS-CoV-2/FLU/RSV testing.  Fact Sheet for Patients: EntrepreneurPulse.com.au  Fact Sheet for Healthcare Providers: IncredibleEmployment.be  This test is not yet approved or cleared by the [place] FDA and has been authorized for detection and/or diagnosis of SARS-CoV-2 by FDA under an Emergency Use Authorization (EUA). This EUA will remain in effect (meaning this test can be used) for the duration of the COVID-19 declaration under Section 564(b)(1) of the Act, 21 U.S.C. section 360bbb-3(b)(1), unless the authorization is terminated or revoked.  Performed at [facility], [address] [place], [place] [address]   Urine Culture     Status: Abnormal   Collection Time: 01/11/21 10:32 AM   Specimen: Urine, Clean Catch  Result Value Ref Range Status   Specimen Description   Final    URINE, CLEAN CATCH Performed at [facility], [address] [place], [place] [address]    Special Requests   Final    NONE Performed at [facility], [address] [place], [place] [address]    Culture (A)  Final    50,000 COLONIES/mL ESCHERICHIA COLI Confirmed Extended Spectrum Beta-Lactamase Producer (ESBL).  In bloodstream infections from ESBL organisms, carbapenems are preferred over piperacillin/tazobactam. They are shown to have a lower risk of mortality.    Report Status 01/14/2021 FINAL  Final   Organism ID, Bacteria ESCHERICHIA COLI (A)  Final      Susceptibility    Escherichia coli - MIC*    AMPICILLIN >=32 RESISTANT Resistant     CEFAZOLIN >=64 RESISTANT  Resistant     CEFEPIME 16 RESISTANT Resistant     CEFTRIAXONE >=64 RESISTANT Resistant     CIPROFLOXACIN >=4 RESISTANT Resistant     GENTAMICIN <=1 SENSITIVE Sensitive     IMIPENEM <=0.25 SENSITIVE Sensitive     NITROFURANTOIN 64 INTERMEDIATE Intermediate     TRIMETH/SULFA <=20 SENSITIVE Sensitive     AMPICILLIN/SULBACTAM >=32 RESISTANT Resistant     PIP/TAZO <=4 SENSITIVE Sensitive     * 50,000 COLONIES/mL ESCHERICHIA COLI  KOH prep     Status: None   Collection Time: 01/13/21 11:53 AM   Specimen: PATH GI Other  Result Value Ref Range Status   Specimen Description ESOPHAGUS  Final   Special Requests NONE  Final   KOH Prep   Final    FEW YEAST Performed at [facility], [address], [place], [place] [address]    Report Status 01/13/2021 FINAL  Final    Radiology Reports CT HEAD WO CONTRAST  Result Date: 01/10/2021 CLINICAL DATA:  Mental status change, unknown cause. Additional history provided: Generalized weakness. EXAM: CT HEAD WITHOUT CONTRAST TECHNIQUE: Contiguous axial images were obtained from the base of the skull through the vertex without intravenous contrast. COMPARISON:  Prior head CT examinations 12/21/2020 and earlier. FINDINGS: Brain: Mild-to-moderate generalized cerebral atrophy. Comparatively mild cerebellar atrophy. Redemonstrated chronic lacunar infarcts within the bilateral basal ganglia. Mild patchy and ill-defined hypoattenuation within the cerebral white matter, nonspecific but compatible with chronic small vessel ischemic disease. Redemonstrated chronic infarct within the right cerebellar hemisphere. There is no acute intracranial hemorrhage. No demarcated cortical infarct. No extra-axial fluid collection. No evidence of an intracranial mass. No midline shift. Vascular: No hyperdense vessel.  Atherosclerotic calcifications. Skull: Unchanged nonspecific sclerotic thickening  of the outer table of the left parietal calvarium, benign in appearance. Sinuses/Orbits: Visualized orbits show no acute finding. No significant paranasal sinus disease. IMPRESSION: No evidence of acute intracranial abnormality. Redemonstrated chronic lacunar infarcts within the bilateral basal ganglia. Mild chronic small vessel ischemic changes within the cerebral white matter. Redemonstrated chronic infarct in the right cerebellar hemisphere. Electronically Signed   By: Kellie Simmering DO   On: 01/10/2021 17:40   DG CHEST PORT 1 VIEW  Result Date: 01/10/2021 CLINICAL DATA:  Nonproductive cough. Diabetes and hypertension. EXAM: PORTABLE CHEST 1 VIEW COMPARISON:  04/28/2021 FINDINGS: The heart size and mediastinal contours are within normal limits. New diffuse interstitial infiltrates are seen, suspicious for interstitial edema. No evidence of pulmonary airspace disease or pleural effusion. Multiple surgical clips again seen in the area the thoracic inlet and lower neck. IMPRESSION: New diffuse interstitial infiltrates, suspicious for interstitial edema. Electronically Signed   By: Marlaine Hind M.D.   On: 01/10/2021 19:47   DG Chest Portable 1 View  Result Date: 12/26/2020 CLINICAL DATA:  Weakness and syncopal episode yesterday. EXAM: PORTABLE CHEST 1 VIEW COMPARISON:  November 21, 2020 FINDINGS: Tortuosity and calcific atherosclerotic disease of the aorta. Postsurgical changes at the thoracic inlet. Cardiomediastinal silhouette is normal. Mediastinal contours appear intact. There is no evidence of pleural effusion or pneumothorax. Streaky airspace opacities in the right lung base. Osseous structures are without acute abnormality. Soft tissues are grossly normal. IMPRESSION: Streaky airspace opacities in the right lung base, which may represent atelectasis or airspace consolidation. Electronically Signed   By: Fidela Salisbury M.D.   On: 12/26/2020 16:13   ECHOCARDIOGRAM COMPLETE  Result Date: 12/27/2020     ECHOCARDIOGRAM REPORT   Patient Name:   JUELL RADNEY Date of Exam: 12/27/2020 Medical  Rec #:  [id number]       Height:       68.0 in Accession #:    [id number]      Weight:       128.3 lb Date of Birth:  08-25-1955      BSA:          1.692 m Patient Age:    89 years        BP:           108/64 mmHg Patient Gender: M               HR:           71 bpm. Exam Location:  [facility] Procedure: 2D Echo, Cardiac Doppler, Color Doppler and Intracardiac            Opacification Agent Indications:    I48.0 Paroxysmal atrial fibrillation  History:        Patient has prior history of Echocardiogram examinations, most                 recent 02/13/2018. Risk Factors:Hypertension, Diabetes and                 Dyslipidemia. Thyroid Disease. Sepsis.  Sonographer:    Jonelle Sidle Dance Referring Phys: [id number] JACOB J STINSON  Sonographer Comments: Technically difficult study due to poor echo windows. Image acquisition challenging due to respiratory motion. IMPRESSIONS  1. Left ventricular ejection fraction, by estimation, is 55 to 60%. The left ventricle has normal function. The left ventricle has no regional wall motion abnormalities. There is mild concentric left ventricular hypertrophy. Left ventricular diastolic parameters are consistent with Grade I diastolic dysfunction (impaired relaxation).  2. Right ventricular systolic function is normal. The right ventricular size is normal. Tricuspid regurgitation signal is inadequate for assessing PA pressure.  3. The mitral valve is grossly normal. No evidence of mitral valve regurgitation. No evidence of mitral stenosis.  4. The aortic valve is tricuspid. Aortic valve regurgitation is not visualized. No aortic stenosis is present.  5. The inferior vena cava is normal in size with greater than 50% respiratory variability, suggesting right atrial pressure of 3 mmHg. Comparison(s): No significant change from prior study. FINDINGS  Left Ventricle: Left ventricular ejection fraction, by estimation,  is 55 to 60%. The left ventricle has normal function. The left ventricle has no regional wall motion abnormalities. Definity contrast agent was given IV to delineate the left ventricular  endocardial borders. The left ventricular internal cavity size was normal in size. There is mild concentric left ventricular hypertrophy. Left ventricular diastolic parameters are consistent with Grade I diastolic dysfunction (impaired relaxation). Right Ventricle: The right ventricular size is normal. No increase in right ventricular wall thickness. Right ventricular systolic function is normal. Tricuspid regurgitation signal is inadequate for assessing PA pressure. Left Atrium: Left atrial size was normal in size. Right Atrium: Right atrial size was normal in size. Pericardium: Trivial pericardial effusion is present. Presence of pericardial fat pad. Mitral Valve: The mitral valve is grossly normal. No evidence of mitral valve regurgitation. No evidence of mitral valve stenosis. Tricuspid Valve: The tricuspid valve is grossly normal. Tricuspid valve regurgitation is trivial. No evidence of tricuspid stenosis. Aortic Valve: The aortic valve is tricuspid. Aortic valve regurgitation is not visualized. No aortic stenosis is present. Pulmonic Valve: The pulmonic valve was not well visualized. Pulmonic valve regurgitation is not visualized. Aorta: The aortic root and ascending aorta are structurally normal, with no evidence of dilitation.  Venous: The inferior vena cava is normal in size with greater than 50% respiratory variability, suggesting right atrial pressure of 3 mmHg. IAS/Shunts: The atrial septum is grossly normal.  LEFT VENTRICLE PLAX 2D LVIDd:         4.02 cm  Diastology LVIDs:         2.98 cm  LV e' medial:   4.64 cm/s LV PW:         1.40 cm  LV E/e' medial: 8.4 LV IVS:        1.31 cm LVOT diam:     2.00 cm LV SV:         30 LV SV Index:   17 LVOT Area:     3.14 cm  RIGHT VENTRICLE RV Basal diam:  2.52 cm TAPSE (M-mode):  1.9 cm LEFT ATRIUM             Index       RIGHT ATRIUM           Index LA diam:        3.10 cm 1.83 cm/m  RA Area:     14.80 cm LA Vol (A2C):   59.7 ml 35.28 ml/m RA Volume:   36.70 ml  21.69 ml/m LA Vol (A4C):   37.6 ml 22.22 ml/m LA Biplane Vol: 47.3 ml 27.95 ml/m  AORTIC VALVE LVOT Vmax:   48.80 cm/s LVOT Vmean:  33.600 cm/s LVOT VTI:    0.094 m  AORTA Ao Root diam: 3.30 cm Ao Asc diam:  3.20 cm MITRAL VALVE MV Area (PHT): 2.48 cm    SHUNTS MV Decel Time: 306 msec    Systemic VTI:  0.09 m MV E velocity: 38.80 cm/s  Systemic Diam: 2.00 cm MV A velocity: 42.70 cm/s MV E/A ratio:  0.91 Eleonore Chiquito MD Electronically signed by Eleonore Chiquito MD Signature Date/Time: 12/27/2020/3:39:42 PM    Final      CBC Recent Labs  Lab 01/10/21 6720 01/11/21 0509 01/12/21 0509 01/13/21 0548  WBC 13.2* 11.0* 12.5* 13.8*  HGB 7.8* 6.7* 6.6* 10.9*  HCT 24.3* 21.0* 21.4* 32.8*  PLT 406* 385 424* 442*  MCV 82.7 84.3 84.9 83.2  MCH 26.5 26.9 26.2 27.7  MCHC 32.1 31.9 30.8 33.2  RDW 16.5* 16.6* 16.4* 16.1*  LYMPHSABS 2.8  --   --   --   MONOABS 0.7  --   --   --   EOSABS 0.3  --   --   --   BASOSABS 0.1  --   --   --     Chemistries  Recent Labs  Lab 01/10/21 0847 01/10/21 0855 01/11/21 0509 01/12/21 0509 01/13/21 0548  NA 141  --  137 143 137  K 2.7*  --  3.9 3.7 4.6  CL 109  --  112* 114* 105  CO2 24  --  18* 22 22  GLUCOSE 94  --  240* 122* 88  BUN 13  --  19 16 10   CREATININE 0.93  --  1.23 1.22 1.19  CALCIUM 5.2*  --  5.1* 5.2* 5.7*  MG  --  1.2* 2.4  --  1.4*  AST 15  --   --   --   --   ALT 9  --   --   --   --   ALKPHOS 86  --   --   --   --   BILITOT 0.8  --   --   --   --    ------------------------------------------------------------------------------------------------------------------  No results for input(s): CHOL, HDL, LDLCALC, TRIG, CHOLHDL, LDLDIRECT in the last 72 hours.  Lab Results  Component Value Date   HGBA1C 6.0 (H) 12/04/2020    ------------------------------------------------------------------------------------------------------------------ No results for input(s): TSH, T4TOTAL, T3FREE, THYROIDAB in the last 72 hours.  Invalid input(s): FREET3  ------------------------------------------------------------------------------------------------------------------ No results for input(s): VITAMINB12, FOLATE, FERRITIN, TIBC, IRON, RETICCTPCT in the last 72 hours.  Coagulation profile No results for input(s): INR, PROTIME in the last 168 hours.  No results for input(s): DDIMER in the last 72 hours.  Cardiac Enzymes No results for input(s): CKMB, TROPONINI, MYOGLOBIN in the last 168 hours.  Invalid input(s): CK ------------------------------------------------------------------------------------------------------------------    Component Value Date/Time   BNP 118.6 (H) 08/03/2016 1119   BNP 37.0 07/03/2016 2143   Stepehn Eckard M.D on 01/14/2021 at 4:38 PM  How to contact the [facility] Attending or Consulting provider [place] or covering provider during after hours [facility], for this patient?  Check the care team in [facility] and look for a) attending/consulting TRH provider listed and b) the [facility] team listed Log into [email or web address] and use [place]'s universal password to access. If you do not have the password, please contact the hospital operator. Locate the [facility] provider you are looking for under [facility] and page to a number that you can be directly reached. If you still have difficulty reaching the provider, please page the [facility] (Director on Call) for the Hospitalists listed on amion for assistance.   [facility] - Office  [phone]

## 2021-01-14 NOTE — Care Management Important Message (Signed)
Important Message  Patient Details  Name: Todd [name] MRN: [id number] Date of Birth: 10-Apr-1956   Medicare Important Message Given:  Yes - Important Message mailed due to current National Emergency     [name] 01/14/2021, 1:01 PM

## 2021-01-15 ENCOUNTER — Encounter (HOSPITAL_COMMUNITY): Payer: Self-pay | Admitting: Internal Medicine

## 2021-01-15 DIAGNOSIS — I482 Chronic atrial fibrillation, unspecified: Secondary | ICD-10-CM | POA: Diagnosis not present

## 2021-01-15 DIAGNOSIS — Z9181 History of falling: Secondary | ICD-10-CM | POA: Diagnosis not present

## 2021-01-15 DIAGNOSIS — E039 Hypothyroidism, unspecified: Secondary | ICD-10-CM

## 2021-01-15 DIAGNOSIS — F1910 Other psychoactive substance abuse, uncomplicated: Secondary | ICD-10-CM | POA: Diagnosis not present

## 2021-01-15 DIAGNOSIS — D500 Iron deficiency anemia secondary to blood loss (chronic): Secondary | ICD-10-CM | POA: Diagnosis not present

## 2021-01-15 DIAGNOSIS — E1169 Type 2 diabetes mellitus with other specified complication: Secondary | ICD-10-CM | POA: Diagnosis not present

## 2021-01-15 DIAGNOSIS — M6281 Muscle weakness (generalized): Secondary | ICD-10-CM | POA: Diagnosis not present

## 2021-01-15 DIAGNOSIS — E559 Vitamin D deficiency, unspecified: Secondary | ICD-10-CM | POA: Diagnosis not present

## 2021-01-15 DIAGNOSIS — F329 Major depressive disorder, single episode, unspecified: Secondary | ICD-10-CM | POA: Diagnosis not present

## 2021-01-15 DIAGNOSIS — Z89611 Acquired absence of right leg above knee: Secondary | ICD-10-CM | POA: Diagnosis not present

## 2021-01-15 DIAGNOSIS — F1010 Alcohol abuse, uncomplicated: Secondary | ICD-10-CM | POA: Diagnosis not present

## 2021-01-15 DIAGNOSIS — Z79899 Other long term (current) drug therapy: Secondary | ICD-10-CM | POA: Diagnosis not present

## 2021-01-15 DIAGNOSIS — I6930 Unspecified sequelae of cerebral infarction: Secondary | ICD-10-CM | POA: Diagnosis not present

## 2021-01-15 DIAGNOSIS — E782 Mixed hyperlipidemia: Secondary | ICD-10-CM | POA: Diagnosis not present

## 2021-01-15 DIAGNOSIS — F17200 Nicotine dependence, unspecified, uncomplicated: Secondary | ICD-10-CM | POA: Diagnosis not present

## 2021-01-15 DIAGNOSIS — I10 Essential (primary) hypertension: Secondary | ICD-10-CM | POA: Diagnosis not present

## 2021-01-15 DIAGNOSIS — F17220 Nicotine dependence, chewing tobacco, uncomplicated: Secondary | ICD-10-CM | POA: Diagnosis not present

## 2021-01-15 DIAGNOSIS — E876 Hypokalemia: Secondary | ICD-10-CM | POA: Diagnosis not present

## 2021-01-15 DIAGNOSIS — E785 Hyperlipidemia, unspecified: Secondary | ICD-10-CM | POA: Diagnosis not present

## 2021-01-15 DIAGNOSIS — Z89612 Acquired absence of left leg above knee: Secondary | ICD-10-CM | POA: Diagnosis not present

## 2021-01-15 DIAGNOSIS — Z23 Encounter for immunization: Secondary | ICD-10-CM | POA: Diagnosis not present

## 2021-01-15 DIAGNOSIS — K921 Melena: Secondary | ICD-10-CM | POA: Diagnosis not present

## 2021-01-15 DIAGNOSIS — F321 Major depressive disorder, single episode, moderate: Secondary | ICD-10-CM | POA: Diagnosis not present

## 2021-01-15 DIAGNOSIS — R262 Difficulty in walking, not elsewhere classified: Secondary | ICD-10-CM | POA: Diagnosis not present

## 2021-01-15 DIAGNOSIS — R5383 Other fatigue: Secondary | ICD-10-CM | POA: Diagnosis not present

## 2021-01-15 DIAGNOSIS — E118 Type 2 diabetes mellitus with unspecified complications: Secondary | ICD-10-CM | POA: Diagnosis not present

## 2021-01-15 DIAGNOSIS — F14222 Cocaine dependence with intoxication with perceptual disturbance: Secondary | ICD-10-CM | POA: Diagnosis not present

## 2021-01-15 DIAGNOSIS — I4891 Unspecified atrial fibrillation: Secondary | ICD-10-CM | POA: Diagnosis not present

## 2021-01-15 DIAGNOSIS — R5381 Other malaise: Secondary | ICD-10-CM | POA: Diagnosis not present

## 2021-01-15 DIAGNOSIS — R253 Fasciculation: Secondary | ICD-10-CM | POA: Diagnosis not present

## 2021-01-15 DIAGNOSIS — R2689 Other abnormalities of gait and mobility: Secondary | ICD-10-CM | POA: Diagnosis not present

## 2021-01-15 DIAGNOSIS — E538 Deficiency of other specified B group vitamins: Secondary | ICD-10-CM | POA: Diagnosis not present

## 2021-01-15 DIAGNOSIS — Z720 Tobacco use: Secondary | ICD-10-CM

## 2021-01-15 DIAGNOSIS — E1121 Type 2 diabetes mellitus with diabetic nephropathy: Secondary | ICD-10-CM | POA: Diagnosis not present

## 2021-01-15 DIAGNOSIS — B3781 Candidal esophagitis: Secondary | ICD-10-CM | POA: Diagnosis not present

## 2021-01-15 DIAGNOSIS — Z794 Long term (current) use of insulin: Secondary | ICD-10-CM | POA: Diagnosis not present

## 2021-01-15 DIAGNOSIS — F1410 Cocaine abuse, uncomplicated: Secondary | ICD-10-CM | POA: Diagnosis not present

## 2021-01-15 DIAGNOSIS — R531 Weakness: Secondary | ICD-10-CM | POA: Diagnosis not present

## 2021-01-15 DIAGNOSIS — F1020 Alcohol dependence, uncomplicated: Secondary | ICD-10-CM | POA: Diagnosis not present

## 2021-01-15 LAB — COMPREHENSIVE METABOLIC PANEL
ALT: 8 U/L (ref 0–44)
AST: 15 U/L (ref 15–41)
Albumin: 1.6 g/dL — ABNORMAL LOW (ref 3.5–5.0)
Alkaline Phosphatase: 95 U/L (ref 38–126)
Anion gap: 4 — ABNORMAL LOW (ref 5–15)
BUN: 10 mg/dL (ref 8–23)
CO2: 19 mmol/L — ABNORMAL LOW (ref 22–32)
Calcium: 5.6 mg/dL — CL (ref 8.9–10.3)
Chloride: 111 mmol/L (ref 98–111)
Creatinine, Ser: 1.04 mg/dL (ref 0.61–1.24)
GFR, Estimated: >60 mL/min (ref >60)
Glucose, Bld: 141 mg/dL — ABNORMAL HIGH (ref 70–99)
Potassium: 3.6 mmol/L (ref 3.5–5.1)
Sodium: 134 mmol/L — ABNORMAL LOW (ref 135–145)
Total Bilirubin: 0.6 mg/dL (ref 0.3–1.2)
Total Protein: 5.7 g/dL — ABNORMAL LOW (ref 6.5–8.1)

## 2021-01-15 LAB — CBC WITH DIFFERENTIAL/PLATELET
Abs Immature Granulocytes: 0.05 10*3/uL (ref 0.00–0.07)
Basophils Absolute: 0.1 10*3/uL (ref 0.0–0.1)
Basophils Relative: 1 %
Eosinophils Absolute: 0.1 10*3/uL (ref 0.0–0.5)
Eosinophils Relative: 1 %
HCT: 30.5 % — ABNORMAL LOW (ref 39.0–52.0)
Hemoglobin: 9.9 g/dL — ABNORMAL LOW (ref 13.0–17.0)
Immature Granulocytes: 1 %
Lymphocytes Relative: 19 %
Lymphs Abs: 2 10*3/uL (ref 0.7–4.0)
MCH: 27.9 pg (ref 26.0–34.0)
MCHC: 32.5 g/dL (ref 30.0–36.0)
MCV: 85.9 fL (ref 80.0–100.0)
Monocytes Absolute: 0.6 10*3/uL (ref 0.1–1.0)
Monocytes Relative: 6 %
Neutro Abs: 7.9 10*3/uL — ABNORMAL HIGH (ref 1.7–7.7)
Neutrophils Relative %: 72 %
Platelets: 460 10*3/uL — ABNORMAL HIGH (ref 150–400)
RBC: 3.55 MIL/uL — ABNORMAL LOW (ref 4.22–5.81)
RDW: 16.7 % — ABNORMAL HIGH (ref 11.5–15.5)
WBC: 10.7 10*3/uL — ABNORMAL HIGH (ref 4.0–10.5)
nRBC: 0 % (ref 0.0–0.2)

## 2021-01-15 LAB — GLUCOSE, CAPILLARY
Glucose-Capillary: 153 mg/dL — ABNORMAL HIGH (ref 70–99)
Glucose-Capillary: 141 mg/dL — ABNORMAL HIGH (ref 70–99)

## 2021-01-15 LAB — MAGNESIUM: Magnesium: 1.7 mg/dL (ref 1.7–2.4)

## 2021-01-15 MED ORDER — APIXABAN 5 MG PO TABS: 5.0000 mg | ORAL_TABLET | Freq: Two times a day (BID) | ORAL | 5 refills | Status: AC

## 2021-01-15 MED ORDER — FOSFOMYCIN TROMETHAMINE 3 G PO PACK
3.0000 g | PACK | Freq: Once | ORAL | Status: AC
  Administered 2021-01-15: 3 g via ORAL
  Filled 2021-01-15: qty 3

## 2021-01-15 MED ORDER — CALCIUM GLUCONATE-NACL 1-0.675 GM/50ML-% IV SOLN
1.0000 g | Freq: Once | INTRAVENOUS | Status: AC
  Administered 2021-01-15: 1000 mg via INTRAVENOUS
  Filled 2021-01-15: qty 50

## 2021-01-15 MED ORDER — CALCIUM CARBONATE ANTACID 500 MG PO CHEW: 2.0000 | CHEWABLE_TABLET | Freq: Two times a day (BID) | ORAL | 4 refills | Status: DC

## 2021-01-15 MED ORDER — CALCIUM GLUCONATE-NACL 2-0.675 GM/100ML-% IV SOLN: 2.0000 g | Freq: Once | INTRAVENOUS | Status: DC

## 2021-01-15 MED ORDER — PANTOPRAZOLE SODIUM 40 MG PO TBEC: 40.0000 mg | DELAYED_RELEASE_TABLET | Freq: Two times a day (BID) | ORAL | Status: AC

## 2021-01-15 MED ORDER — ACETAMINOPHEN 325 MG PO TABS: 650.0000 mg | ORAL_TABLET | Freq: Four times a day (QID) | ORAL | Status: AC | PRN

## 2021-01-15 MED ORDER — FLUCONAZOLE 200 MG PO TABS: 200.0000 mg | ORAL_TABLET | Freq: Every day | ORAL | 0 refills | Status: AC

## 2021-01-15 MED ORDER — ADULT MULTIVITAMIN W/MINERALS CH: 1.0000 | ORAL_TABLET | Freq: Every day | ORAL | Status: AC

## 2021-01-15 MED ORDER — AMLODIPINE BESYLATE 5 MG PO TABS: 5.0000 mg | ORAL_TABLET | Freq: Every day | ORAL | 11 refills | Status: DC

## 2021-01-15 MED ORDER — LEVOTHYROXINE SODIUM 100 MCG PO TABS: 100.0000 ug | ORAL_TABLET | Freq: Every day | ORAL | 11 refills | Status: DC

## 2021-01-15 MED ORDER — GERHARDT'S BUTT CREAM
1.0000 | TOPICAL_CREAM | Freq: Three times a day (TID) | CUTANEOUS | Status: DC
Start: 2021-01-15 — End: 2021-05-24

## 2021-01-15 MED ORDER — POLYETHYLENE GLYCOL 3350 17 G PO PACK: 17.0000 g | PACK | Freq: Every day | ORAL | 0 refills | Status: AC

## 2021-01-15 NOTE — Progress Notes (Signed)
Physical Therapy Treatment Patient Details Name: Todd [name] MRN: [id number] DOB: 1956/01/05 Today's Date: 01/15/2021    History of Present Illness Todd [name]  is a 65 y.o. male with PMHx remarkable for poorly controlled diabetes , HTN, severe hypothyroidism and chronic atrial fibrillation,, status post bilateral AKA , as well as history of polysubstance abuse including alcohol tobacco and  almost daily cocaine use who is notoriously noncompliant with medications presents to the ED with generalized weakness and tachycardia and is found to be in A. fib with RVR    PT Comments    Patient demonstrates slow labored  movement for sitting up in flat bed having to use bed rails and Mod assist to pull self to sitting, had difficulty maintaining sitting balance supporting self with BUE due to frequent leaning falling backwards, after verbal cues to turn and face chair able to use armrest of chair to help scoot self forward, transfer to chair and turn around with Min assist and frequent verbal cueing.  Patient states his family members usually pull him into his wheelchair with mostly their assistance.  I feel patient has potential to transfer to his chair Mod Indep with continued training.  Patient tolerated sitting up in chair after therapy - nursing staff notified.  Patient will benefit from continued physical therapy in hospital and recommended venue below to increase strength, balance, endurance for safe ADLs and gait.     Follow Up Recommendations  SNF;Supervision - Intermittent     Equipment Recommendations  3in1 (PT)    Recommendations for Other Services       Precautions / Restrictions Precautions Precautions: Fall Restrictions Weight Bearing Restrictions: No    Mobility  Bed Mobility Overal bed mobility: Needs Assistance Bed Mobility: Supine to Sit;Sit to Supine;Rolling Rolling: Min guard   Supine to sit: Mod assist Sit to supine: Min assist   General bed mobility  comments: slow labored movement with bed flat and using bed rails    Transfers Overall transfer level: Needs assistance   Transfers: Lateral/Scoot Transfers          Lateral/Scoot Transfers: Mod assist General transfer comment: slow labored movement having to maintain sitting balance mostly by leaning on bed rails, armrest of chair or Mod assist tactile cueing  Ambulation/Gait                 Stairs             Wheelchair Mobility    Modified Rankin (Stroke Patients Only)       Balance Overall balance assessment: Needs assistance Sitting-balance support: Feet unsupported;No upper extremity supported Sitting balance-Leahy Scale: Poor Sitting balance - Comments: fair/poor supporting self with BUE, fair holding onto bed rail Postural control: Posterior lean                                  Cognition Arousal/Alertness: Awake/alert Behavior During Therapy: WFL for tasks assessed/performed Overall Cognitive Status: Within Functional Limits for tasks assessed                                        Exercises      General Comments        Pertinent Vitals/Pain Pain Assessment: No/denies pain    Home Living  Prior Function            PT Goals (current goals can now be found in the care plan section) Acute Rehab PT Goals Patient Stated Goal: to go home PT Goal Formulation: With patient Time For Goal Achievement: 01/24/21 Potential to Achieve Goals: Good Progress towards PT goals: Progressing toward goals    Frequency    Min 3X/week      PT Plan Current plan remains appropriate    Co-evaluation              AM-PAC PT "6 Clicks" Mobility   Outcome Measure  Help needed turning from your back to your side while in a flat bed without using bedrails?: A Little Help needed moving from lying on your back to sitting on the side of a flat bed without using bedrails?: A Lot Help  needed moving to and from a bed to a chair (including a wheelchair)?: A Lot Help needed standing up from a chair using your arms (e.g., wheelchair or bedside chair)?: Total Help needed to walk in hospital room?: Total Help needed climbing 3-5 steps with a railing? : Total 6 Click Score: 10    End of Session   Activity Tolerance: Patient tolerated treatment well;Patient limited by fatigue Patient left: in chair;with call bell/phone within reach Nurse Communication: Mobility status PT Visit Diagnosis: Muscle weakness (generalized) (M62.81);History of falling (Z91.81)     Time: 8372-9021 PT Time Calculation (min) (ACUTE ONLY): 24 min  Charges:  $Therapeutic Activity: 23-37 mins                     2:09 PM, 01/15/21 Lonell Grandchild, MPT Physical Therapist with [facility] [phone] office [phone] mobile phone

## 2021-01-15 NOTE — Care Management Important Message (Signed)
Important Message  Patient Details  Name: Todd [name] MRN: [id number] Date of Birth: Oct 06, 1955   Medicare Important Message Given:  Yes     [name] 01/15/2021, 12:21 PM

## 2021-01-15 NOTE — Progress Notes (Signed)
Tele called patient had a 6 beat run of vtach. Reports no complaints of chest pain or discomfort. MD Wynetta Emery aware.

## 2021-01-15 NOTE — TOC Transition Note (Signed)
Transition of Care [facility]) - CM/SW Discharge Note   Patient Details  Name: Daxton Nydam MRN: [id number] Date of Birth: 11-20-1955  Transition of [facility] CM/SW Contact:  Shade Flood, LCSW Phone Number: 01/15/2021, 3:06 PM   Clinical Narrative:     Pt stable for dc today per MD. Jackelyn Poling at [facility] states they have received insurance auth and they can accept pt today. Updated pt's sister [name]. DC clinical sent electronically. RN to call report. Pt will transfer with EMS.  There are no other TOC needs for dc.  Final next level of care: Skilled Nursing Facility Barriers to Discharge: Barriers Resolved   Patient Goals and CMS Choice Patient states their goals for this hospitalization and ongoing recovery are:: SNF CMS Medicare.gov Compare Post Acute Care list provided to:: Patient Choice offered to / list presented to : Patient  Discharge Placement              Patient chooses bed at: Other - please specify in the comment section below: ([facility]) Patient to be transferred to facility by: EMS Name of family member notified: [name] Patient and family notified of of transfer: 01/15/21  Discharge Plan and Services In-house Referral: Clinical Social Work   Post Acute Care Choice: [place]                               Social Determinants of Health (SDOH) Interventions     Readmission Risk Interventions No flowsheet data found.

## 2021-01-15 NOTE — Progress Notes (Signed)
Patient discharged to [facility], transported via EMS to facility. Discharge paperwork given to EMS to give to nurse at [facility]. Family informed about patients transfer to facility. Belongings sent with patient.

## 2021-01-15 NOTE — Discharge Instructions (Signed)
IMPORTANT INFORMATION: PAY CLOSE ATTENTION   PHYSICIAN DISCHARGE INSTRUCTIONS  Follow with Primary care provider  Nicholson, Sterling J IV, FNP  and other consultants as instructed by your Hospitalist Physician  SEEK MEDICAL CARE OR RETURN TO EMERGENCY ROOM IF SYMPTOMS COME BACK, WORSEN OR NEW PROBLEM DEVELOPS   Please note: You were cared for by a hospitalist during your hospital stay. Every effort will be made to forward records to your primary care provider.  You can request that your primary care provider send for your hospital records if they have not received them.  Once you are discharged, your primary care physician will handle any further medical issues. Please note that NO REFILLS for any discharge medications will be authorized once you are discharged, as it is imperative that you return to your primary care physician (or establish a relationship with a primary care physician if you do not have one) for your post hospital discharge needs so that they can reassess your need for medications and monitor your lab values.  Please get a complete blood count and chemistry panel checked by your Primary MD at your next visit, and again as instructed by your Primary MD.  Get Medicines reviewed and adjusted: Please take all your medications with you for your next visit with your Primary MD  Laboratory/radiological data: Please request your Primary MD to go over all hospital tests and procedure/radiological results at the follow up, please ask your primary care provider to get all Hospital records sent to his/her office.  In some cases, they will be blood work, cultures and biopsy results pending at the time of your discharge. Please request that your primary care provider follow up on these results.  If you are diabetic, please bring your blood sugar readings with you to your follow up appointment with primary care.    Please call and make your follow up appointments as soon as possible.     Also Note the following: If you experience worsening of your admission symptoms, develop shortness of breath, life threatening emergency, suicidal or homicidal thoughts you must seek medical attention immediately by calling 911 or calling your MD immediately  if symptoms less severe.  You must read complete instructions/literature along with all the possible adverse reactions/side effects for all the Medicines you take and that have been prescribed to you. Take any new Medicines after you have completely understood and accpet all the possible adverse reactions/side effects.   Do not drive when taking Pain medications or sleeping medications (Benzodiazepines)  Do not take more than prescribed Pain, Sleep and Anxiety Medications. It is not advisable to combine anxiety,sleep and pain medications without talking with your primary care practitioner  Special Instructions: If you have smoked or chewed Tobacco  in the last 2 yrs please stop smoking, stop any regular Alcohol  and or any Recreational drug use.  Wear Seat belts while driving.  Do not drive if taking any narcotic, mind altering or controlled substances or recreational drugs or alcohol.       

## 2021-01-15 NOTE — Progress Notes (Signed)
Tele called patient had a 11 beat run of vtach. Reports no complaints, request to have food. Md [facility] aware.

## 2021-01-15 NOTE — Discharge Summary (Addendum)
Physician Discharge Summary  Todd [name] ZOX:[id number] DOB: 18-Feb-1956 DOA: 01/10/2021  PCP: [name], FNP Endocrinologist: Dr. Dorris Fetch  Admit date: 01/10/2021 Discharge date: 01/15/2021  Disposition: SNF   Recommendations for Outpatient Follow-up:  Follow up with PCP in 2 weeks Follow up with Dr. Dorris Fetch in 1 month regarding thyroid check  Follow up with Dr. Merlene Laughter in 1 month  Recommend air mattress overlay bed for sacral wound Consult wound care nurse for sacral wound care Please obtain BMP/CBC in 1 week to follow up electrolytes and hemoglobin Please follow up on the following pending results: biopsy results from EGD/colonoscopy  Discharge Condition: STABLE   CODE STATUS: FULL DIET: Low sodium heart healthy / Carb modified, no concentrated sweets or fruit juices except to treat a low blood sugar.   Brief Hospitalization Summary: Please see all hospital notes, images, labs for full details of the hospitalization. ADMISSION  HPI:  Todd [name]  is a 65 y.o. male with PMHx remarkable for poorly controlled diabetes , HTN, severe hypothyroidism and chronic atrial fibrillation,, status post bilateral AKA , as well as history of polysubstance abuse including alcohol tobacco and  almost daily cocaine use who is notoriously noncompliant with medications presents to the ED with generalized weakness and tachycardia and is found to be in A. fib with RVR -EKG with A. fib with heart rate up to 153 --Magnesium was 1.2 --Calcium is 5.2 with an albumin of 1.7 so corrected calcium is around 7 -Potassium is 2.7 -Denies vomiting or diarrhea   -Additional history obtained from patient's sisters Todd [name] and Todd [name] No fever  Or chills Had some cough  -Heart rate improved with metoprolol and IV fluids -WBCs 19.2--- patient with slight cough, COVID test and chest x-ray as well as UA pending --Hemoglobin is down to 7.8 from a baseline usually around 9--- stool occult blood  requested  HOSPITAL COURSE   Brief Summary:- 65 year old with history of polysubstance abuse including alcohol tobacco and  almost daily cocaine use who is notoriously noncompliant with medications admitted on 01/10/2021 with concerns for generalized weakness and A. fib with RVR in the setting of severely abnormal thyroid and severe electrolyte derangement including hypokalemia, hypomagnesemia and hypocalcemia, patient found to have symptomatic anemia with hemoglobin around 6.6 -Patient will [facility] to [facility] SN[facility]   A/P   1)Chronic A. fib with AVR---most likely related to severe electrolyte derangement including hypophosphatemia, hypocalcemia and severely abnormal thyroid -EKG with A. fib and heart rate up to 153 Magnesium 1.2 >> 2.4 --Calcium is 5.1 with an albumin of 1.6 so corrected calcium is around 7 -Potassium is 2.7 >> 3.7 -echocardiogram without significant aortic stenosis or other outflow obstruction, EF is 55 to 60%, and also without  segmental/Regional wall motion abnormalities. Hold Eliquis due to heme positive stool with drop in H&H, RESUME APIXABAN ON 01/16/21 PER GI TEAM. -Continue metoprolol for rate control,  -UDS negative for cocaine   2) Severe hypothyroidism--patient is notoriously noncompliant, TSH  189.8 on 12/26/20 - repeat TSH is Now 0.805 -Free T4 was 0.56 on 12/26/20 and Now 1.78-low at 0.5, will repeat -Decreased levothyroxine to 100 mcg from 150 -follow-up with endocrinologist Dr. Dorris Fetch in about 3 weeks for repeat TSH and free T4 advised   3)Acute on Chronic symptomatic anemia-- Hgb 7.8 >>6.7>>6.6  baseline Hgb usually around 9 -Stopped Eliquis Stool occult blood is positive -Continue Protonix -GI consult requested -B12 and folate not low -Serum iron is low at 30, TIBC 145 -Transfuse 2 units of  PRBC with Lasix in between on 01/12/21 -GI plans EGD and colonoscopy on 01/13/2021 with findings: EGD Patchy whitish material coating mucosa of proximal esophagus.  Brushing  taken for KOH prep. Grade B esophagitis involving distal 2 cm of esophageal mucosa.  Biopsy taken.  Bottle #1.   GE junction at 42 cm from the incisors. Nonerosive antral gastritis and erosive bulbar duodenitis. Antral biopsy taken for routine histology.  Bottle #2.   Colonoscopy Prep adequate. Examination performed to cecum. 2 small polyps cold snared from splenic flexure and placed in bottle #3. 8 mm polyp hot snare from sigmoid colon and placed in bottle #3. Sigmoid colon diverticulosis Edema to rectal mucosa. Diminished anal sphincter tone.   4)Hypophosphatemia, hypomagnesemia and  hypocalcemia and hypokalemia----severe electrolyte derangement and severely abnormal thyroid probably contributing to patient's arrhythmias -Patient recently had episodes of nonsustained V. Tach during recent hospitalization -Electrolytes as above #1, continue to replace electrolytes including calcium and potassium thyroid function as above #2 -No further arrhythmias at this time, CHECK BMP IN 1 WEEK TO FOLLOW UP ELECTROLYTES   5)DM2-A1c 6.0 reflecting excellent diabetic control PTA, but patient sugars are probably not elevated also because patient really does not eat much he drinks a lot of alcohol and just does not eat much --DIET CONTROLLED    6)Polysubstance abuse + Etoh Abuse--patient uses alcohol, tobacco and cocaine almost on a daily basis -lorazepam per CIWA protocol for DT prophylaxis, thiamine and folic acid as ordered --Pt recently refused inpatient rehab for drugs and alcohol --Social worker was able to make arrangements for patient to hopefully go to [facility] as outpatient and possibly get admitted to [facility] in [place] for inpatient rehab -Please see social worker documentation dated 12/29/2020 from [place] is negative this admission    7)Generalized weakness and deconditioning--- most likely related to severe electrolyte abnormalities including severe hypophosphatemia,  hypocalcemia and hypokalemia as well as severely abnormal thyroid -Replace electrolytes as above in #4 -Patient evaluated by physical therapist on 01/10/2021 recommends SNF rehab -CT head without contrast with evidence of old strokes, but no new acute infarcts -  8)Anorexia/FTT--- multifactorial, resulting in severe electrolyte abnormalities, -Megace for appetite stimulation as ordered -Okay to take multivitamin and nutritional supplements   9)Social/Ethics--patient technically lives alone in an apartment,  -Typically uses cocaine and alcohol and tobacco on a daily basis -Recently has had multiple hospitalizations to [facility] [address] noncompliant -High risk for decompensation and death -Plan of care discussed with patient's sisters Todd [name] and Scientist, physiological by phone -Patient is a full code   10)Leukocytosis--RESOLVED  -Chest x-ray without pneumonia,   ESBL UTI - He was treated with meropenem and fosfomycin - TREATED    11)H/o old CVAs--- chronic lacunar infarcts within the bilateral basal Ganglia, Mild chronic small vessel ischemic changes within the cerebral white matter.  -chronic infarct in the right cerebellar hemisphere. ??? Dementia related to old CVAs and EToh Abuse - continue secondary prevention    Esophageal candidiasis - ESOPHAGEAL BRUSHINGS POSITIVE FOR YEAST. - Pt was started on oral fluconazole 400 mg x 1 followed by 200 mg daily x 20 days.     Disposition/Need for in-Hospital Stay- AWAITING INSURANCE AUTHORIZATION FOR SNF    Status is: Inpatient   Remains inpatient appropriate because: Please see disposition above   Dispo: The patient is from: Home              Anticipated d/c is to: SNF -- [facility] once GI work-up  is completed              Anticipated d/c date is: 01/15/2021              Patient currently is medically stable to d/c. Barriers: Not Clinically Stable-    Code Status : -  Code Status: Full Code  Discharge  Diagnoses:  Principal Problem:   Atrial fibrillation with RVR ([place]) Active Problems:   Hypothyroidism   Hypokalemia   Hypocalcemia   Cocaine abuse -on-going   Alcohol abuse, continuous   Diabetes mellitus type 2 with complications (HCC)   Tobacco abuse   Hypomagnesemia   Iron deficiency anemia due to chronic blood loss   Blood in stool   Candida esophagitis ([place])   Discharge Instructions:  Allergies as of 01/15/2021       Reactions   Lisinopril Other (See Comments)   Hyperkalemia / Renal failure        Medication List     STOP taking these medications    phosphorus 155-852-130 MG tablet Commonly known as: K PHOS NEUTRAL       TAKE these medications    acetaminophen 325 MG tablet Commonly known as: TYLENOL Take 2 tablets (650 mg total) by mouth every 6 (six) hours as needed for mild pain (or Fever >/= 101).   allopurinol 100 MG tablet Commonly known as: ZYLOPRIM Take 1 tablet (100 mg total) by mouth daily.   amLODipine 5 MG tablet Commonly known as: NORVASC Take 1 tablet (5 mg total) by mouth daily.   apixaban 5 MG Tabs tablet Commonly known as: ELIQUIS Take 1 tablet (5 mg total) by mouth 2 (two) times daily. Start taking on: January 16, 2021   atorvastatin 40 MG tablet Commonly known as: LIPITOR TAKE 1 TABLET BY MOUTH ONCE DAILY AT 6 PM   blood glucose meter kit and supplies Dispense based on patient and insurance preference. Use up to four times daily as directed. (FOR ICD-10 E10.9, E11.9).   calcium carbonate 500 MG chewable tablet Commonly known as: TUMS - dosed in mg elemental calcium Chew 2 tablets (400 mg of elemental calcium total) by mouth 2 (two) times daily with a meal. What changed: how much to take   cetirizine 10 MG tablet Commonly known as: ZYRTEC Take 1 tablet (10 mg total) by mouth daily as needed for allergies. For allergy symptoms   fluconazole 200 MG tablet Commonly known as: DIFLUCAN Take 1 tablet (200 mg total) by mouth  daily for 19 days. Start taking on: January 16, 3418   folic acid 1 MG tablet Commonly known as: FOLVITE Take 1 tablet (1 mg total) by mouth daily.   Gerhardt's butt cream Crea Apply 1 application topically 3 (three) times daily.   glucose blood test strip Commonly known as: Accu-Chek Aviva Plus Check blood sugars four times a day   levothyroxine 100 MCG tablet Commonly known as: Synthroid Take 1 tablet (100 mcg total) by mouth daily. What changed:  medication strength how much to take   megestrol 400 MG/10ML suspension Commonly known as: MEGACE Take 10 mLs (400 mg total) by mouth 2 (two) times daily. For appetite stimulation   metoprolol succinate 25 MG 24 hr tablet Commonly known as: Toprol XL Take 1 tablet (25 mg total) by mouth daily.   multivitamin with minerals Tabs tablet Take 1 tablet by mouth daily. Start taking on: January 16, 2021   pantoprazole 40 MG tablet Commonly known as: PROTONIX Take 1 tablet (40 mg  total) by mouth 2 (two) times daily.   polyethylene glycol 17 g packet Commonly known as: MIRALAX / GLYCOLAX Take 17 g by mouth daily. Start taking on: January 16, 2021   sertraline 50 MG tablet Commonly known as: ZOLOFT Take 1 tablet (50 mg total) by mouth at bedtime.   silver sulfADIAZINE 1 % cream Commonly known as: SILVADENE Apply 1 application topically daily. Apply to buttock   sodium bicarbonate 650 MG tablet Take 1 tablet (650 mg total) by mouth 3 (three) times daily.   thiamine 100 MG tablet Take 1 tablet (100 mg total) by mouth daily.        Contact information for follow-up providers     Phillips Odor, MD. Schedule an appointment as soon as possible for a visit in 1 month(s).   Specialty: Neurology Why: Hospital Follow Up Contact information: [address] [place] [address] [phone]         [name], FNP. Schedule an appointment as soon as possible for a visit in 2 week(s).   Specialty: Family Medicine Why:  Hospital Follow Up Contact information: [place] [address] [place] [address] [phone]         [name], MD .   Specialty: Cardiology Contact information: [place] [address] [phone]              Contact information for after-discharge care     [facility] .   Service: Skilled Nursing Contact information: [place] [address] [phone]                    Allergies  Allergen Reactions   Lisinopril Other (See Comments)    Hyperkalemia / Renal failure   Allergies as of 01/15/2021       Reactions   Lisinopril Other (See Comments)   Hyperkalemia / Renal failure        Medication List     STOP taking these medications    phosphorus 155-852-130 MG tablet Commonly known as: K PHOS NEUTRAL       TAKE these medications    acetaminophen 325 MG tablet Commonly known as: TYLENOL Take 2 tablets (650 mg total) by mouth every 6 (six) hours as needed for mild pain (or Fever >/= 101).   allopurinol 100 MG tablet Commonly known as: ZYLOPRIM Take 1 tablet (100 mg total) by mouth daily.   amLODipine 5 MG tablet Commonly known as: NORVASC Take 1 tablet (5 mg total) by mouth daily.   apixaban 5 MG Tabs tablet Commonly known as: ELIQUIS Take 1 tablet (5 mg total) by mouth 2 (two) times daily. Start taking on: January 16, 2021   atorvastatin 40 MG tablet Commonly known as: LIPITOR TAKE 1 TABLET BY MOUTH ONCE DAILY AT 6 PM   blood glucose meter kit and supplies Dispense based on patient and insurance preference. Use up to four times daily as directed. (FOR ICD-10 E10.9, E11.9).   calcium carbonate 500 MG chewable tablet Commonly known as: TUMS - dosed in mg elemental calcium Chew 2 tablets (400 mg of elemental calcium total) by mouth 2 (two) times daily with a meal. What changed: how much  to take   cetirizine 10 MG tablet Commonly known as: ZYRTEC Take 1 tablet (10 mg total) by mouth daily as needed for allergies. For allergy symptoms   fluconazole  200 MG tablet Commonly known as: DIFLUCAN Take 1 tablet (200 mg total) by mouth daily for 19 days. Start taking on: January 16, 253   folic acid 1 MG tablet Commonly known as: FOLVITE Take 1 tablet (1 mg total) by mouth daily.   Gerhardt's butt cream Crea Apply 1 application topically 3 (three) times daily.   glucose blood test strip Commonly known as: Accu-Chek Aviva Plus Check blood sugars four times a day   levothyroxine 100 MCG tablet Commonly known as: Synthroid Take 1 tablet (100 mcg total) by mouth daily. What changed:  medication strength how much to take   megestrol 400 MG/10ML suspension Commonly known as: MEGACE Take 10 mLs (400 mg total) by mouth 2 (two) times daily. For appetite stimulation   metoprolol succinate 25 MG 24 hr tablet Commonly known as: Toprol XL Take 1 tablet (25 mg total) by mouth daily.   multivitamin with minerals Tabs tablet Take 1 tablet by mouth daily. Start taking on: January 16, 2021   pantoprazole 40 MG tablet Commonly known as: PROTONIX Take 1 tablet (40 mg total) by mouth 2 (two) times daily.   polyethylene glycol 17 g packet Commonly known as: MIRALAX / GLYCOLAX Take 17 g by mouth daily. Start taking on: January 16, 2021   sertraline 50 MG tablet Commonly known as: ZOLOFT Take 1 tablet (50 mg total) by mouth at bedtime.   silver sulfADIAZINE 1 % cream Commonly known as: SILVADENE Apply 1 application topically daily. Apply to buttock   sodium bicarbonate 650 MG tablet Take 1 tablet (650 mg total) by mouth 3 (three) times daily.   thiamine 100 MG tablet Take 1 tablet (100 mg total) by mouth daily.        Procedures/Studies: CT HEAD WO CONTRAST  Result Date: 01/10/2021 CLINICAL DATA:  Mental status change, unknown cause. Additional history provided:  Generalized weakness. EXAM: CT HEAD WITHOUT CONTRAST TECHNIQUE: Contiguous axial images were obtained from the base of the skull through the vertex without intravenous contrast. COMPARISON:  Prior head CT examinations 12/21/2020 and earlier. FINDINGS: Brain: Mild-to-moderate generalized cerebral atrophy. Comparatively mild cerebellar atrophy. Redemonstrated chronic lacunar infarcts within the bilateral basal ganglia. Mild patchy and ill-defined hypoattenuation within the cerebral white matter, nonspecific but compatible with chronic small vessel ischemic disease. Redemonstrated chronic infarct within the right cerebellar hemisphere. There is no acute intracranial hemorrhage. No demarcated cortical infarct. No extra-axial fluid collection. No evidence of an intracranial mass. No midline shift. Vascular: No hyperdense vessel.  Atherosclerotic calcifications. Skull: Unchanged nonspecific sclerotic thickening of the outer table of the left parietal calvarium, benign in appearance. Sinuses/Orbits: Visualized orbits show no acute finding. No significant paranasal sinus disease. IMPRESSION: No evidence of acute intracranial abnormality. Redemonstrated chronic lacunar infarcts within the bilateral basal ganglia. Mild chronic small vessel ischemic changes within the cerebral white matter. Redemonstrated chronic infarct in the right cerebellar hemisphere. Electronically Signed   By: Kellie Simmering DO   On: 01/10/2021 17:40   DG CHEST PORT 1 VIEW  Result Date: 01/10/2021 CLINICAL DATA:  Nonproductive cough. Diabetes and hypertension. EXAM: PORTABLE CHEST 1 VIEW COMPARISON:  04/28/2021 FINDINGS: The heart size and mediastinal contours are within normal limits. New diffuse interstitial infiltrates are seen, suspicious for interstitial edema. No evidence of pulmonary airspace disease or pleural effusion. Multiple surgical clips again seen in the area the thoracic inlet and lower neck. IMPRESSION: New diffuse interstitial  infiltrates, suspicious for interstitial edema. Electronically Signed   By: Myles Rosenthal.D.  On: 01/10/2021 19:47   DG Chest Portable 1 View  Result Date: 12/26/2020 CLINICAL DATA:  Weakness and syncopal episode yesterday. EXAM: PORTABLE CHEST 1 VIEW COMPARISON:  November 21, 2020 FINDINGS: Tortuosity and calcific atherosclerotic disease of the aorta. Postsurgical changes at the thoracic inlet. Cardiomediastinal silhouette is normal. Mediastinal contours appear intact. There is no evidence of pleural effusion or pneumothorax. Streaky airspace opacities in the right lung base. Osseous structures are without acute abnormality. Soft tissues are grossly normal. IMPRESSION: Streaky airspace opacities in the right lung base, which may represent atelectasis or airspace consolidation. Electronically Signed   By: Fidela Salisbury M.D.   On: 12/26/2020 16:13   ECHOCARDIOGRAM COMPLETE  Result Date: 12/27/2020    ECHOCARDIOGRAM REPORT   Patient Name:   AXYL SITZMAN Date of Exam: 12/27/2020 Medical Rec #:  [id number]       Height:       68.0 in Accession #:    [id number]      Weight:       128.3 lb Date of Birth:  1955-07-13      BSA:          1.692 m Patient Age:    57 years        BP:           108/64 mmHg Patient Gender: M               HR:           71 bpm. Exam Location:  [facility] Procedure: 2D Echo, Cardiac Doppler, Color Doppler and Intracardiac            Opacification Agent Indications:    I48.0 Paroxysmal atrial fibrillation  History:        Patient has prior history of Echocardiogram examinations, most                 recent 02/13/2018. Risk Factors:Hypertension, Diabetes and                 Dyslipidemia. Thyroid Disease. Sepsis.  Sonographer:    Jonelle Sidle Dance Referring Phys: [id number] JACOB J STINSON  Sonographer Comments: Technically difficult study due to poor echo windows. Image acquisition challenging due to respiratory motion. IMPRESSIONS  1. Left ventricular ejection fraction, by estimation, is 55 to  60%. The left ventricle has normal function. The left ventricle has no regional wall motion abnormalities. There is mild concentric left ventricular hypertrophy. Left ventricular diastolic parameters are consistent with Grade I diastolic dysfunction (impaired relaxation).  2. Right ventricular systolic function is normal. The right ventricular size is normal. Tricuspid regurgitation signal is inadequate for assessing PA pressure.  3. The mitral valve is grossly normal. No evidence of mitral valve regurgitation. No evidence of mitral stenosis.  4. The aortic valve is tricuspid. Aortic valve regurgitation is not visualized. No aortic stenosis is present.  5. The inferior vena cava is normal in size with greater than 50% respiratory variability, suggesting right atrial pressure of 3 mmHg. Comparison(s): No significant change from prior study. FINDINGS  Left Ventricle: Left ventricular ejection fraction, by estimation, is 55 to 60%. The left ventricle has normal function. The left ventricle has no regional wall motion abnormalities. Definity contrast agent was given IV to delineate the left ventricular  endocardial borders. The left ventricular internal cavity size was normal in size. There is mild concentric left ventricular hypertrophy. Left ventricular diastolic parameters are consistent with Grade I diastolic dysfunction (impaired relaxation). Right Ventricle: The right ventricular size  is normal. No increase in right ventricular wall thickness. Right ventricular systolic function is normal. Tricuspid regurgitation signal is inadequate for assessing PA pressure. Left Atrium: Left atrial size was normal in size. Right Atrium: Right atrial size was normal in size. Pericardium: Trivial pericardial effusion is present. Presence of pericardial fat pad. Mitral Valve: The mitral valve is grossly normal. No evidence of mitral valve regurgitation. No evidence of mitral valve stenosis. Tricuspid Valve: The tricuspid valve is  grossly normal. Tricuspid valve regurgitation is trivial. No evidence of tricuspid stenosis. Aortic Valve: The aortic valve is tricuspid. Aortic valve regurgitation is not visualized. No aortic stenosis is present. Pulmonic Valve: The pulmonic valve was not well visualized. Pulmonic valve regurgitation is not visualized. Aorta: The aortic root and ascending aorta are structurally normal, with no evidence of dilitation. Venous: The inferior vena cava is normal in size with greater than 50% respiratory variability, suggesting right atrial pressure of 3 mmHg. IAS/Shunts: The atrial septum is grossly normal.  LEFT VENTRICLE PLAX 2D LVIDd:         4.02 cm  Diastology LVIDs:         2.98 cm  LV e' medial:   4.64 cm/s LV PW:         1.40 cm  LV E/e' medial: 8.4 LV IVS:        1.31 cm LVOT diam:     2.00 cm LV SV:         30 LV SV Index:   17 LVOT Area:     3.14 cm  RIGHT VENTRICLE RV Basal diam:  2.52 cm TAPSE (M-mode): 1.9 cm LEFT ATRIUM             Index       RIGHT ATRIUM           Index LA diam:        3.10 cm 1.83 cm/m  RA Area:     14.80 cm LA Vol (A2C):   59.7 ml 35.28 ml/m RA Volume:   36.70 ml  21.69 ml/m LA Vol (A4C):   37.6 ml 22.22 ml/m LA Biplane Vol: 47.3 ml 27.95 ml/m  AORTIC VALVE LVOT Vmax:   48.80 cm/s LVOT Vmean:  33.600 cm/s LVOT VTI:    0.094 m  AORTA Ao Root diam: 3.30 cm Ao Asc diam:  3.20 cm MITRAL VALVE MV Area (PHT): 2.48 cm    SHUNTS MV Decel Time: 306 msec    Systemic VTI:  0.09 m MV E velocity: 38.80 cm/s  Systemic Diam: 2.00 cm MV A velocity: 42.70 cm/s MV E/A ratio:  0.91 Eleonore Chiquito MD Electronically signed by Eleonore Chiquito MD Signature Date/Time: 12/27/2020/3:39:42 PM    Final      Subjective: Pt awake, alert, asking for potato chips.  No specific complaints.   Discharge Exam: Vitals:   01/15/21 0925 01/15/21 1344  BP: (!) 146/77 (!) 157/94  Pulse: 84 91  Resp:  17  Temp:  98.6 F (37 C)  SpO2:  100%   Vitals:   01/14/21 2122 01/15/21 0551 01/15/21 0925 01/15/21  1344  BP: 135/76 (!) 145/96 (!) 146/77 (!) 157/94  Pulse: 86 91 84 91  Resp: _0 Temp: 98.1 F (36.7 C) 99.1 F (37.3 C)  98.6 F (37 C)  TempSrc: Oral Oral    SpO2: 100% 100%  100%  Weight:      Height:       General: Pt is alert, awake, not  in acute distress Cardiovascular: normal S1/S2 +, no rubs, no gallops Respiratory: CTA bilaterally, no wheezing, no rhonchi Abdominal: Soft, NT, ND, bowel sounds + Extremities: no edema, no cyanosis   The results of significant diagnostics from this hospitalization (including imaging, microbiology, ancillary and laboratory) are listed below for reference.     Microbiology: Recent Results (from the past 240 hour(s))  Resp Panel by RT-PCR (Flu A&B, Covid) Nasopharyngeal Swab     Status: None   Collection Time: 01/10/21  3:21 PM   Specimen: Nasopharyngeal Swab; Nasopharyngeal(NP) swabs in vial transport medium  Result Value Ref Range Status   SARS Coronavirus 2 by RT PCR NEGATIVE NEGATIVE Final    Comment: (NOTE) SARS-CoV-2 target nucleic acids are NOT DETECTED.  The SARS-CoV-2 RNA is generally detectable in upper respiratory specimens during the acute phase of infection. The lowest concentration of SARS-CoV-2 viral copies this assay can detect is 138 copies/mL. A negative result does not preclude SARS-Cov-2 infection and should not be used as the sole basis for treatment or other patient management decisions. A negative result may occur with  improper specimen collection/handling, submission of specimen other than nasopharyngeal swab, presence of viral mutation(s) within the areas targeted by this assay, and inadequate number of viral copies(<138 copies/mL). A negative result must be combined with clinical observations, patient history, and epidemiological information. The expected result is Negative.  Fact Sheet for Patients:  EntrepreneurPulse.com.au  Fact Sheet for Healthcare Providers:   IncredibleEmployment.be  This test is no t yet approved or cleared by the [place] FDA and  has been authorized for detection and/or diagnosis of SARS-CoV-2 by FDA under an Emergency Use Authorization (EUA). This EUA will remain  in effect (meaning this test can be used) for the duration of the COVID-19 declaration under Section 564(b)(1) of the Act, 21 U.S.C.section 360bbb-3(b)(1), unless the authorization is terminated  or revoked sooner.       Influenza A by PCR NEGATIVE NEGATIVE Final   Influenza B by PCR NEGATIVE NEGATIVE Final    Comment: (NOTE) The Xpert Xpress SARS-CoV-2/FLU/RSV plus assay is intended as an aid in the diagnosis of influenza from Nasopharyngeal swab specimens and should not be used as a sole basis for treatment. Nasal washings and aspirates are unacceptable for Xpert Xpress SARS-CoV-2/FLU/RSV testing.  Fact Sheet for Patients: EntrepreneurPulse.com.au  Fact Sheet for Healthcare Providers: IncredibleEmployment.be  This test is not yet approved or cleared by the [place] FDA and has been authorized for detection and/or diagnosis of SARS-CoV-2 by FDA under an Emergency Use Authorization (EUA). This EUA will remain in effect (meaning this test can be used) for the duration of the COVID-19 declaration under Section 564(b)(1) of the Act, 21 U.S.C. section 360bbb-3(b)(1), unless the authorization is terminated or revoked.  Performed at [facility], [address] [place], [place] [address]   Urine Culture     Status: Abnormal   Collection Time: 01/11/21 10:32 AM   Specimen: Urine, Clean Catch  Result Value Ref Range Status   Specimen Description   Final    URINE, CLEAN CATCH Performed at [facility], [address] [place], [place] [address]    Special Requests   Final    NONE Performed at [facility], [address] [place], [place] [address]    Culture (A)  Final    50,000  COLONIES/mL ESCHERICHIA COLI Confirmed Extended Spectrum Beta-Lactamase Producer (ESBL).  In bloodstream infections from ESBL organisms, carbapenems are preferred over piperacillin/tazobactam. They are shown to have  a lower risk of mortality.    Report Status 01/14/2021 FINAL  Final   Organism ID, Bacteria ESCHERICHIA COLI (A)  Final      Susceptibility   Escherichia coli - MIC*    AMPICILLIN >=32 RESISTANT Resistant     CEFAZOLIN >=64 RESISTANT Resistant     CEFEPIME 16 RESISTANT Resistant     CEFTRIAXONE >=64 RESISTANT Resistant     CIPROFLOXACIN >=4 RESISTANT Resistant     GENTAMICIN <=1 SENSITIVE Sensitive     IMIPENEM <=0.25 SENSITIVE Sensitive     NITROFURANTOIN 64 INTERMEDIATE Intermediate     TRIMETH/SULFA <=20 SENSITIVE Sensitive     AMPICILLIN/SULBACTAM >=32 RESISTANT Resistant     PIP/TAZO <=4 SENSITIVE Sensitive     * 50,000 COLONIES/mL ESCHERICHIA COLI  KOH prep     Status: None   Collection Time: 01/13/21 11:53 AM   Specimen: PATH GI Other  Result Value Ref Range Status   Specimen Description ESOPHAGUS  Final   Special Requests NONE  Final   KOH Prep   Final    FEW YEAST Performed at [facility], [address] [place], [place] [address]    Report Status 01/13/2021 FINAL  Final     Labs: BNP (last 3 results) No results for input(s): BNP in the last 8760 hours. Basic Metabolic Panel: Recent Labs  Lab 01/10/21 0847 01/10/21 0855 01/11/21 0509 01/12/21 0509 01/13/21 0548 01/15/21 0750  NA 141  --  137 143 137 134*  K 2.7*  --  3.9 3.7 4.6 3.6  CL 109  --  112* 114* 105 111  CO2 24  --  18* 22 22 19*  GLUCOSE 94  --  240* 122* 88 141*  BUN 13  --  _0 CREATININE 0.93  --  1.23 1.22 1.19 1.04  CALCIUM 5.2*  --  5.1* 5.2* 5.7* 5.6*  MG  --  1.2* 2.4  --  1.4* 1.7  PHOS  --   --  4.2 4.2 5.3*  --    Liver Function Tests: Recent Labs  Lab 01/10/21 0847 01/11/21 0509 01/12/21 0509 01/13/21 0548 01/15/21 0750  AST 15  --   --   --  15  ALT  9  --   --   --  8  ALKPHOS 86  --   --   --  95  BILITOT 0.8  --   --   --  0.6  PROT 6.3*  --   --   --  5.7*  ALBUMIN 1.7* 1.6* 1.6* 1.8* 1.6*   No results for input(s): LIPASE, AMYLASE in the last 168 hours. No results for input(s): AMMONIA in the last 168 hours. CBC: Recent Labs  Lab 01/10/21 0847 01/11/21 0509 01/12/21 0509 01/13/21 0548 01/15/21 0750  WBC 13.2* 11.0* 12.5* 13.8* 10.7*  NEUTROABS 9.3*  --   --   --  7.9*  HGB 7.8* 6.7* 6.6* 10.9* 9.9*  HCT 24.3* 21.0* 21.4* 32.8* 30.5*  MCV 82.7 84.3 84.9 83.2 85.9  PLT 406* 385 424* 442* 460*   Cardiac Enzymes: No results for input(s): CKTOTAL, CKMB, CKMBINDEX, TROPONINI in the last 168 hours. BNP: Invalid input(s): POCBNP CBG: Recent Labs  Lab 01/14/21 1136 01/14/21 1553 01/14/21 2151 01/15/21 0735 01/15/21 1107  GLUCAP 121* 118* 166* 153* 141*   D-Dimer No results for input(s): DDIMER in the last 72 hours. Hgb A1c No results for input(s): HGBA1C in the last 72 hours. Lipid Profile No results  for input(s): CHOL, HDL, LDLCALC, TRIG, CHOLHDL, LDLDIRECT in the last 72 hours. Thyroid function studies No results for input(s): TSH, T4TOTAL, T3FREE, THYROIDAB in the last 72 hours.  Invalid input(s): FREET3 Anemia work up No results for input(s): VITAMINB12, FOLATE, FERRITIN, TIBC, IRON, RETICCTPCT in the last 72 hours. Urinalysis    Component Value Date/Time   COLORURINE AMBER (A) 01/11/2021 1030   APPEARANCEUR CLOUDY (A) 01/11/2021 1030   APPEARANCEUR Clear 02/09/2018 1352   LABSPEC 1.017 01/11/2021 1030   PHURINE 5.0 01/11/2021 1030   GLUCOSEU NEGATIVE 01/11/2021 1030   HGBUR SMALL (A) 01/11/2021 1030   BILIRUBINUR NEGATIVE 01/11/2021 1030   BILIRUBINUR Negative 02/09/2018 1352   KETONESUR NEGATIVE 01/11/2021 1030   PROTEINUR 30 (A) 01/11/2021 1030   NITRITE NEGATIVE 01/11/2021 1030   LEUKOCYTESUR MODERATE (A) 01/11/2021 1030   Sepsis Labs Invalid input(s): PROCALCITONIN,  WBC,   LACTICIDVEN Microbiology Recent Results (from the past 240 hour(s))  Resp Panel by RT-PCR (Flu A&B, Covid) Nasopharyngeal Swab     Status: None   Collection Time: 01/10/21  3:21 PM   Specimen: Nasopharyngeal Swab; Nasopharyngeal(NP) swabs in vial transport medium  Result Value Ref Range Status   SARS Coronavirus 2 by RT PCR NEGATIVE NEGATIVE Final    Comment: (NOTE) SARS-CoV-2 target nucleic acids are NOT DETECTED.  The SARS-CoV-2 RNA is generally detectable in upper respiratory specimens during the acute phase of infection. The lowest concentration of SARS-CoV-2 viral copies this assay can detect is 138 copies/mL. A negative result does not preclude SARS-Cov-2 infection and should not be used as the sole basis for treatment or other patient management decisions. A negative result may occur with  improper specimen collection/handling, submission of specimen other than nasopharyngeal swab, presence of viral mutation(s) within the areas targeted by this assay, and inadequate number of viral copies(<138 copies/mL). A negative result must be combined with clinical observations, patient history, and epidemiological information. The expected result is Negative.  Fact Sheet for Patients:  EntrepreneurPulse.com.au  Fact Sheet for Healthcare Providers:  IncredibleEmployment.be  This test is no t yet approved or cleared by the [place] FDA and  has been authorized for detection and/or diagnosis of SARS-CoV-2 by FDA under an Emergency Use Authorization (EUA). This EUA will remain  in effect (meaning this test can be used) for the duration of the COVID-19 declaration under Section 564(b)(1) of the Act, 21 U.S.C.section 360bbb-3(b)(1), unless the authorization is terminated  or revoked sooner.       Influenza A by PCR NEGATIVE NEGATIVE Final   Influenza B by PCR NEGATIVE NEGATIVE Final    Comment: (NOTE) The Xpert Xpress SARS-CoV-2/FLU/RSV plus  assay is intended as an aid in the diagnosis of influenza from Nasopharyngeal swab specimens and should not be used as a sole basis for treatment. Nasal washings and aspirates are unacceptable for Xpert Xpress SARS-CoV-2/FLU/RSV testing.  Fact Sheet for Patients: EntrepreneurPulse.com.au  Fact Sheet for Healthcare Providers: IncredibleEmployment.be  This test is not yet approved or cleared by the [place] FDA and has been authorized for detection and/or diagnosis of SARS-CoV-2 by FDA under an Emergency Use Authorization (EUA). This EUA will remain in effect (meaning this test can be used) for the duration of the COVID-19 declaration under Section 564(b)(1) of the Act, 21 U.S.C. section 360bbb-3(b)(1), unless the authorization is terminated or revoked.  Performed at [facility], [address] [place], [place] [address]   Urine Culture     Status: Abnormal   Collection Time: 01/11/21 10:32  AM   Specimen: Urine, Clean Catch  Result Value Ref Range Status   Specimen Description   Final    URINE, CLEAN CATCH Performed at [facility], [address] [place], [place] [address]    Special Requests   Final    NONE Performed at [facility], [address], [place], [place] [address]    Culture (A)  Final    50,000 COLONIES/mL ESCHERICHIA COLI Confirmed Extended Spectrum Beta-Lactamase Producer (ESBL).  In bloodstream infections from ESBL organisms, carbapenems are preferred over piperacillin/tazobactam. They are shown to have a lower risk of mortality.    Report Status 01/14/2021 FINAL  Final   Organism ID, Bacteria ESCHERICHIA COLI (A)  Final      Susceptibility   Escherichia coli - MIC*    AMPICILLIN >=32 RESISTANT Resistant     CEFAZOLIN >=64 RESISTANT Resistant     CEFEPIME 16 RESISTANT Resistant     CEFTRIAXONE >=64 RESISTANT Resistant     CIPROFLOXACIN >=4 RESISTANT Resistant     GENTAMICIN <=1 SENSITIVE Sensitive     IMIPENEM  <=0.25 SENSITIVE Sensitive     NITROFURANTOIN 64 INTERMEDIATE Intermediate     TRIMETH/SULFA <=20 SENSITIVE Sensitive     AMPICILLIN/SULBACTAM >=32 RESISTANT Resistant     PIP/TAZO <=4 SENSITIVE Sensitive     * 50,000 COLONIES/mL ESCHERICHIA COLI  KOH prep     Status: None   Collection Time: 01/13/21 11:53 AM   Specimen: PATH GI Other  Result Value Ref Range Status   Specimen Description ESOPHAGUS  Final   Special Requests NONE  Final   KOH Prep   Final    FEW YEAST Performed at [facility], [address] [place], [place] [address]    Report Status 01/13/2021 FINAL  Final    Time coordinating discharge: 37 minutes   SIGNED:  Irwin Brakeman, MD  [facility] 01/15/2021, 2:56 PM How to contact the [facility] Attending or Consulting provider [place] or covering provider during after hours [place], for this patient?  Check the care team in [facility] and look for a) attending/consulting [facility] provider listed and b) the [facility] team listed Log into [email or web address] and use [facility]'s universal password to access. If you do not have the password, please contact the hospital operator. Locate the [facility] provider you are looking for under [facility] and page to a number that you can be directly reached. If you still have difficulty reaching the provider, please page the [facility] (Director on Call) for the Hospitalists listed on amion for assistance.

## 2021-01-18 DIAGNOSIS — Z794 Long term (current) use of insulin: Secondary | ICD-10-CM | POA: Diagnosis not present

## 2021-01-18 DIAGNOSIS — F1910 Other psychoactive substance abuse, uncomplicated: Secondary | ICD-10-CM | POA: Diagnosis not present

## 2021-01-18 DIAGNOSIS — Z89611 Acquired absence of right leg above knee: Secondary | ICD-10-CM | POA: Diagnosis not present

## 2021-01-18 DIAGNOSIS — I10 Essential (primary) hypertension: Secondary | ICD-10-CM | POA: Diagnosis not present

## 2021-01-18 DIAGNOSIS — F1410 Cocaine abuse, uncomplicated: Secondary | ICD-10-CM | POA: Diagnosis not present

## 2021-01-18 DIAGNOSIS — F1010 Alcohol abuse, uncomplicated: Secondary | ICD-10-CM | POA: Diagnosis not present

## 2021-01-18 DIAGNOSIS — I4891 Unspecified atrial fibrillation: Secondary | ICD-10-CM | POA: Diagnosis not present

## 2021-01-18 DIAGNOSIS — E1169 Type 2 diabetes mellitus with other specified complication: Secondary | ICD-10-CM | POA: Diagnosis not present

## 2021-01-18 LAB — SURGICAL PATHOLOGY

## 2021-01-19 DIAGNOSIS — R5381 Other malaise: Secondary | ICD-10-CM | POA: Diagnosis not present

## 2021-01-19 DIAGNOSIS — I4891 Unspecified atrial fibrillation: Secondary | ICD-10-CM | POA: Diagnosis not present

## 2021-01-25 DIAGNOSIS — F1410 Cocaine abuse, uncomplicated: Secondary | ICD-10-CM

## 2021-01-25 HISTORY — DX: Cocaine abuse, uncomplicated: F14.10

## 2021-01-26 DIAGNOSIS — E118 Type 2 diabetes mellitus with unspecified complications: Secondary | ICD-10-CM | POA: Diagnosis not present

## 2021-02-05 DIAGNOSIS — Z89612 Acquired absence of left leg above knee: Secondary | ICD-10-CM | POA: Diagnosis not present

## 2021-02-05 DIAGNOSIS — Z89611 Acquired absence of right leg above knee: Secondary | ICD-10-CM | POA: Diagnosis not present

## 2021-02-05 DIAGNOSIS — M6281 Muscle weakness (generalized): Secondary | ICD-10-CM | POA: Diagnosis not present

## 2021-02-05 DIAGNOSIS — R262 Difficulty in walking, not elsewhere classified: Secondary | ICD-10-CM | POA: Diagnosis not present

## 2021-02-05 DIAGNOSIS — Z9181 History of falling: Secondary | ICD-10-CM | POA: Diagnosis not present

## 2021-02-05 DIAGNOSIS — R2689 Other abnormalities of gait and mobility: Secondary | ICD-10-CM | POA: Diagnosis not present

## 2021-02-10 DIAGNOSIS — I6930 Unspecified sequelae of cerebral infarction: Secondary | ICD-10-CM | POA: Diagnosis not present

## 2021-02-10 DIAGNOSIS — R2689 Other abnormalities of gait and mobility: Secondary | ICD-10-CM | POA: Diagnosis not present

## 2021-02-10 DIAGNOSIS — R253 Fasciculation: Secondary | ICD-10-CM | POA: Diagnosis not present

## 2021-02-10 DIAGNOSIS — I482 Chronic atrial fibrillation, unspecified: Secondary | ICD-10-CM | POA: Diagnosis not present

## 2021-02-10 DIAGNOSIS — E538 Deficiency of other specified B group vitamins: Secondary | ICD-10-CM | POA: Diagnosis not present

## 2021-02-10 DIAGNOSIS — R5383 Other fatigue: Secondary | ICD-10-CM | POA: Diagnosis not present

## 2021-02-16 ENCOUNTER — Ambulatory Visit: Payer: Medicare HMO | Admitting: Nurse Practitioner

## 2021-02-16 DIAGNOSIS — E039 Hypothyroidism, unspecified: Secondary | ICD-10-CM | POA: Diagnosis not present

## 2021-02-16 LAB — TSH: TSH: 20.52 — AB (ref 0.41–5.90)

## 2021-02-18 DIAGNOSIS — E039 Hypothyroidism, unspecified: Secondary | ICD-10-CM | POA: Diagnosis not present

## 2021-02-18 DIAGNOSIS — I10 Essential (primary) hypertension: Secondary | ICD-10-CM | POA: Diagnosis not present

## 2021-02-18 DIAGNOSIS — E1169 Type 2 diabetes mellitus with other specified complication: Secondary | ICD-10-CM | POA: Diagnosis not present

## 2021-02-18 DIAGNOSIS — I4891 Unspecified atrial fibrillation: Secondary | ICD-10-CM | POA: Diagnosis not present

## 2021-02-24 DIAGNOSIS — F17200 Nicotine dependence, unspecified, uncomplicated: Secondary | ICD-10-CM | POA: Diagnosis not present

## 2021-02-24 DIAGNOSIS — F14222 Cocaine dependence with intoxication with perceptual disturbance: Secondary | ICD-10-CM | POA: Diagnosis not present

## 2021-02-24 DIAGNOSIS — F1020 Alcohol dependence, uncomplicated: Secondary | ICD-10-CM | POA: Diagnosis not present

## 2021-02-24 DIAGNOSIS — F329 Major depressive disorder, single episode, unspecified: Secondary | ICD-10-CM | POA: Diagnosis not present

## 2021-02-25 ENCOUNTER — Other Ambulatory Visit: Payer: Self-pay

## 2021-02-25 ENCOUNTER — Encounter: Payer: Self-pay | Admitting: Nurse Practitioner

## 2021-02-25 ENCOUNTER — Ambulatory Visit (INDEPENDENT_AMBULATORY_CARE_PROVIDER_SITE_OTHER): Payer: Medicare HMO | Admitting: Nurse Practitioner

## 2021-02-25 VITALS — BP 137/74 | HR 84

## 2021-02-25 DIAGNOSIS — Z794 Long term (current) use of insulin: Secondary | ICD-10-CM

## 2021-02-25 DIAGNOSIS — E1121 Type 2 diabetes mellitus with diabetic nephropathy: Secondary | ICD-10-CM | POA: Diagnosis not present

## 2021-02-25 DIAGNOSIS — E782 Mixed hyperlipidemia: Secondary | ICD-10-CM

## 2021-02-25 DIAGNOSIS — I10 Essential (primary) hypertension: Secondary | ICD-10-CM | POA: Diagnosis not present

## 2021-02-25 DIAGNOSIS — E559 Vitamin D deficiency, unspecified: Secondary | ICD-10-CM | POA: Diagnosis not present

## 2021-02-25 DIAGNOSIS — E039 Hypothyroidism, unspecified: Secondary | ICD-10-CM | POA: Diagnosis not present

## 2021-02-25 MED ORDER — LEVOTHYROXINE SODIUM 112 MCG PO TABS: 112.0000 ug | ORAL_TABLET | Freq: Every day | ORAL | 1 refills | Status: DC

## 2021-02-25 NOTE — Patient Instructions (Signed)
Diabetes Mellitus and Nutrition, Adult When you have diabetes, or diabetes mellitus, it is very important to have healthy eating habits because your blood sugar (glucose) levels are greatly affected by what you eat and drink. Eating healthy foods in the right amounts, at about the same times every day, can help you:  Control your blood glucose.  Lower your risk of heart disease.  Improve your blood pressure.  Reach or maintain a healthy weight. What can affect my meal plan? Every person with diabetes is different, and each person has different needs for a meal plan. Your health care provider may recommend that you work with a dietitian to make a meal plan that is best for you. Your meal plan may vary depending on factors such as:  The calories you need.  The medicines you take.  Your weight.  Your blood glucose, blood pressure, and cholesterol levels.  Your activity level.  Other health conditions you have, such as heart or kidney disease. How do carbohydrates affect me? Carbohydrates, also called carbs, affect your blood glucose level more than any other type of food. Eating carbs naturally raises the amount of glucose in your blood. Carb counting is a method for keeping track of how many carbs you eat. Counting carbs is important to keep your blood glucose at a healthy level, especially if you use insulin or take certain oral diabetes medicines. It is important to know how many carbs you can safely have in each meal. This is different for every person. Your dietitian can help you calculate how many carbs you should have at each meal and for each snack. How does alcohol affect me? Alcohol can cause a sudden decrease in blood glucose (hypoglycemia), especially if you use insulin or take certain oral diabetes medicines. Hypoglycemia can be a life-threatening condition. Symptoms of hypoglycemia, such as sleepiness, dizziness, and confusion, are similar to symptoms of having too much  alcohol.  Do not drink alcohol if: ? Your health care provider tells you not to drink. ? You are pregnant, may be pregnant, or are planning to become pregnant.  If you drink alcohol: ? Do not drink on an empty stomach. ? Limit how much you use to:  0-1 drink a day for women.  0-2 drinks a day for men. ? Be aware of how much alcohol is in your drink. In the [place] one drink equals one 12 oz bottle of beer (355 mL), one 5 oz glass of wine (148 mL), or one 1 oz glass of hard liquor (44 mL). ? Keep yourself hydrated with water, diet soda, or unsweetened iced tea.  Keep in mind that regular soda, juice, and other mixers may contain a lot of sugar and must be counted as carbs. What are tips for following this plan? Reading food labels  Start by checking the serving size on the "Nutrition Facts" label of packaged foods and drinks. The amount of calories, carbs, fats, and other nutrients listed on the label is based on one serving of the item. Many items contain more than one serving per package.  Check the total grams (g) of carbs in one serving. You can calculate the number of servings of carbs in one serving by dividing the total carbs by 15. For example, if a food has 30 g of total carbs per serving, it would be equal to 2 servings of carbs.  Check the number of grams (g) of saturated fats and trans fats in one serving. Choose foods that have   a low amount or none of these fats.  Check the number of milligrams (mg) of salt (sodium) in one serving. Most people should limit total sodium intake to less than 2,300 mg per day.  Always check the nutrition information of foods labeled as "low-fat" or "nonfat." These foods may be higher in added sugar or refined carbs and should be avoided.  Talk to your dietitian to identify your daily goals for nutrients listed on the label. Shopping  Avoid buying canned, pre-made, or processed foods. These foods tend to be high in fat, sodium, and added  sugar.  Shop around the outside edge of the grocery store. This is where you will most often find fresh fruits and vegetables, bulk grains, fresh meats, and fresh dairy. Cooking  Use low-heat cooking methods, such as baking, instead of high-heat cooking methods like deep frying.  Cook using healthy oils, such as olive, canola, or sunflower oil.  Avoid cooking with butter, cream, or high-fat meats. Meal planning  Eat meals and snacks regularly, preferably at the same times every day. Avoid going long periods of time without eating.  Eat foods that are high in fiber, such as fresh fruits, vegetables, beans, and whole grains. Talk with your dietitian about how many servings of carbs you can eat at each meal.  Eat 4-6 oz (112-168 g) of lean protein each day, such as lean meat, chicken, fish, eggs, or tofu. One ounce (oz) of lean protein is equal to: ? 1 oz (28 g) of meat, chicken, or fish. ? 1 egg. ?  cup (62 g) of tofu.  Eat some foods each day that contain healthy fats, such as avocado, nuts, seeds, and fish.   What foods should I eat? Fruits Berries. Apples. Oranges. Peaches. Apricots. Plums. Grapes. Mango. Papaya. Pomegranate. Kiwi. Cherries. Vegetables Lettuce. Spinach. Leafy greens, including kale, chard, collard greens, and mustard greens. Beets. Cauliflower. Cabbage. Broccoli. Carrots. Green beans. Tomatoes. Peppers. Onions. Cucumbers. Brussels sprouts. Grains Whole grains, such as whole-wheat or whole-grain bread, crackers, tortillas, cereal, and pasta. Unsweetened oatmeal. Quinoa. Brown or wild rice. Meats and other proteins Seafood. Poultry without skin. Lean cuts of poultry and beef. Tofu. Nuts. Seeds. Dairy Low-fat or fat-free dairy products such as milk, yogurt, and cheese. The items listed above may not be a complete list of foods and beverages you can eat. Contact a dietitian for more information. What foods should I avoid? Fruits Fruits canned with  syrup. Vegetables Canned vegetables. Frozen vegetables with butter or cream sauce. Grains Refined white flour and flour products such as bread, pasta, snack foods, and cereals. Avoid all processed foods. Meats and other proteins Fatty cuts of meat. Poultry with skin. Breaded or fried meats. Processed meat. Avoid saturated fats. Dairy Full-fat yogurt, cheese, or milk. Beverages Sweetened drinks, such as soda or iced tea. The items listed above may not be a complete list of foods and beverages you should avoid. Contact a dietitian for more information. Questions to ask a health care provider  Do I need to meet with a diabetes educator?  Do I need to meet with a dietitian?  What number can I call if I have questions?  When are the best times to check my blood glucose? Where to find more information:  American Diabetes Association: diabetes.org  Academy of Nutrition and Dietetics: www.eatright.org  National Institute of Diabetes and Digestive and Kidney Diseases: www.niddk.nih.gov  Association of Diabetes Care and Education Specialists: www.diabeteseducator.org Summary  It is important to have healthy eating   habits because your blood sugar (glucose) levels are greatly affected by what you eat and drink.  A healthy meal plan will help you control your blood glucose and maintain a healthy lifestyle.  Your health care provider may recommend that you work with a dietitian to make a meal plan that is best for you.  Keep in mind that carbohydrates (carbs) and alcohol have immediate effects on your blood glucose levels. It is important to count carbs and to use alcohol carefully. This information is not intended to replace advice given to you by your health care provider. Make sure you discuss any questions you have with your health care provider. Document Revised: 05/07/2019 Document Reviewed: 05/07/2019 Elsevier Patient Education  2021 Elsevier Inc.  

## 2021-02-25 NOTE — Progress Notes (Signed)
02/25/2021, 10:12 AM        Endocrinology follow-up note       Subjective:    Patient ID: Todd [name], male    DOB: [birth year].  Todd [name] is here to follow-up for management of hypothyroidism (after recent hospitalization) as requested by  [name], FNP.   Past Medical History:  Diagnosis Date   AKI (acute kidney injury) ([place])    Constipated    Diabetes mellitus without complication ([place])    Diarrhea    Elevated LFTs    Goiter    Gout    Hyperlipidemia    Hypertension    Leukocytosis    Reactive thrombocytosis    Right BKA infection ([place]) 08/2016   Right leg pain    Sepsis due to undetermined organism [facility])    Thyroid disease    Wound infection after surgery 08/2016    Past Surgical History:  Procedure Laterality Date   ABDOMINAL AORTOGRAM N/A 08/11/2016   Procedure: Abdominal Aortogram;  Surgeon: Waynetta Sandy, MD;  Location: [facility];  Service: Cardiovascular;  Laterality: N/A;   ABDOMINAL AORTOGRAM W/LOWER EXTREMITY N/A 08/15/2016   Procedure: Abdominal Aortogram w/Lower Extremity;  Surgeon: Elam Dutch, MD;  Location: [facility];  Service: Cardiovascular;  Laterality: N/A;   AMPUTATION Right 08/17/2016   Procedure: RIGHT BELOW KNEE AMPUTATION;  Surgeon: Elam Dutch, MD;  Location: [place];  Service: Vascular;  Laterality: Right;   AMPUTATION Right 09/12/2016   Procedure: AMPUTATION ABOVE KNEE;  Surgeon: Newt Minion, MD;  Location: [place];  Service: Orthopedics;  Laterality: Right;   AMPUTATION Left 08/12/2016   Procedure: LEFT BELOW KNEE AMPUTATION;  Surgeon: Newt Minion, MD;  Location: [place];  Service: Orthopedics;  Laterality: Left;   AMPUTATION Left 11/01/2017   Procedure: LEFT ABOVE KNEE AMPUTATION;  Surgeon: Newt Minion, MD;  Location: [place];  Service: Orthopedics;  Laterality: Left;   APPLICATION OF WOUND VAC Right 09/12/2016   Procedure:  APPLICATION OF WOUND VAC ABOVE KNEE;  Surgeon: Newt Minion, MD;  Location: [place];  Service: Orthopedics;  Laterality: Right;   BIOPSY  01/13/2021   Procedure: BIOPSY;  Surgeon: Rogene Houston, MD;  [facility];  Service: Endoscopy;;  esophageal   COLONOSCOPY WITH PROPOFOL N/A 01/13/2021   Procedure: COLONOSCOPY WITH PROPOFOL;  Surgeon: Rogene Houston, MD;  [facility];  Service: Endoscopy;  Laterality: N/A;   ESOPHAGOGASTRODUODENOSCOPY (EGD) WITH PROPOFOL N/A 01/13/2021   Procedure: ESOPHAGOGASTRODUODENOSCOPY (EGD) WITH PROPOFOL;  Surgeon: Rogene Houston, MD;  [facility];  Service: Endoscopy;  Laterality: N/A;   LOWER EXTREMITY ANGIOGRAPHY Bilateral 08/11/2016   Procedure: Lower Extremity Angiography;  Surgeon: Waynetta Sandy, MD;  Location: [place] [facility];  Service: Cardiovascular;  Laterality: Bilateral;   PERIPHERAL VASCULAR BALLOON ANGIOPLASTY Left 08/11/2016   Procedure: Peripheral Vascular Balloon Angioplasty;  Surgeon: Waynetta Sandy, MD;  Location: [place] [facility];  Service: Cardiovascular;  Laterality: Left;  SFA   POLYPECTOMY  01/13/2021   Procedure: POLYPECTOMY;  Surgeon: Rogene Houston, MD;  [facility];  Service: Endoscopy;;   THYROID SURGERY      Social History  Socioeconomic History   Marital status: Single    Spouse name: Not on file   Number of children: Not on file   Years of education: Not on file   Highest education level: Not on file  Occupational History   Occupation: retired    Comment: drove a Medical illustrator  Tobacco Use   Smoking status: Every Day    Packs/day: 0.25    Years: 45.00    Pack years: 11.25    Types: Cigarettes    Last attempt to quit: 11/12/2014    Years since quitting: 6.2   Smokeless tobacco: Never  Vaping Use   Vaping Use: Never used  Substance and Sexual Activity   Alcohol use: Yes    Alcohol/week: 2.0 standard drinks    Types: 1 Glasses of wine, 1 Cans of beer per  week   Drug use: No   Sexual activity: Not Currently  Other Topics Concern   Not on file  Social History Narrative   09/23/20 - Lives alone, uses a wheelchair, double amputee, not married, no children - HHA comes in 3x per week to help him bathe and set up weekly meds.   Social Determinants of Health   Financial Resource Strain: Low Risk    Difficulty of Paying Living Expenses: Not very hard  Food Insecurity: No Food Insecurity   Worried About Charity fundraiser in the Last Year: Never true   Ran Out of Food in the Last Year: Never true  Transportation Needs: No Transportation Needs   Lack of Transportation (Medical): No   Lack of Transportation (Non-Medical): No  Physical Activity: Inactive   Days of Exercise per Week: 0 days   Minutes of Exercise per Session: 0 min  Stress: No Stress Concern Present   Feeling of Stress : Only a little  Social Connections: Socially Isolated   Frequency of Communication with Friends and Family: More than three times a week   Frequency of Social Gatherings with Friends and Family: More than three times a week   Attends Religious Services: Never   Marine scientist or Organizations: No   Attends Music therapist: Never   Marital Status: Never married    Family History  Problem Relation Age of Onset   Heart disease Mother    Pneumonia Father    Diabetes Maternal Aunt    Diabetes Maternal Uncle     Outpatient Encounter Medications as of 02/25/2021  Medication Sig   Accu-Chek Softclix Lancets lancets    acetaminophen (TYLENOL) 325 MG tablet Take 2 tablets (650 mg total) by mouth every 6 (six) hours as needed for mild pain (or Fever >/= 101).   allopurinol (ZYLOPRIM) 100 MG tablet Take 1 tablet (100 mg total) by mouth daily. (Patient taking differently: Take 300 mg by mouth daily.)   amLODipine (NORVASC) 5 MG tablet Take 1 tablet (5 mg total) by mouth daily.   apixaban (ELIQUIS) 5 MG TABS tablet Take 1 tablet (5 mg total) by  mouth 2 (two) times daily.   atorvastatin (LIPITOR) 40 MG tablet TAKE 1 TABLET BY MOUTH ONCE DAILY AT 6 PM   blood glucose meter kit and supplies Dispense based on patient and insurance preference. Use up to four times daily as directed. (FOR ICD-10 E10.9, E11.9).   calcium carbonate (TUMS - DOSED IN MG ELEMENTAL CALCIUM) 500 MG chewable tablet Chew 2 tablets (400 mg of elemental calcium total) by mouth 2 (two) times daily with a meal.   cetirizine (  ZYRTEC) 10 MG tablet Take 1 tablet (10 mg total) by mouth daily as needed for allergies. For allergy symptoms   cholecalciferol (VITAMIN D3) 25 MCG (1000 UNIT) tablet Take 1,000 Units by mouth daily.   folic acid (FOLVITE) 1 MG tablet Take 1 tablet (1 mg total) by mouth daily.   glucose blood (ACCU-CHEK AVIVA PLUS) test strip Check blood sugars four times a day   megestrol (MEGACE) 400 MG/10ML suspension Take 10 mLs (400 mg total) by mouth 2 (two) times daily. For appetite stimulation   metoprolol succinate (TOPROL XL) 25 MG 24 hr tablet Take 1 tablet (25 mg total) by mouth daily.   Multiple Vitamin (MULTIVITAMIN WITH MINERALS) TABS tablet Take 1 tablet by mouth daily.   Nutritional Supplements (NUTRITIONAL SHAKE HIGH PROTEIN) LIQD Take by mouth.   Nystatin (GERHARDT'S BUTT CREAM) CREA Apply 1 application topically 3 (three) times daily.   pantoprazole (PROTONIX) 40 MG tablet Take 1 tablet (40 mg total) by mouth 2 (two) times daily.   polyethylene glycol (MIRALAX / GLYCOLAX) 17 g packet Take 17 g by mouth daily.   sertraline (ZOLOFT) 50 MG tablet Take 1 tablet (50 mg total) by mouth at bedtime.   silver sulfADIAZINE (SILVADENE) 1 % cream Apply 1 application topically daily. Apply to buttock   thiamine 100 MG tablet Take 1 tablet (100 mg total) by mouth daily.   [DISCONTINUED] levothyroxine (SYNTHROID) 100 MCG tablet Take 1 tablet (100 mcg total) by mouth daily.   levothyroxine (SYNTHROID) 112 MCG tablet Take 1 tablet (112 mcg total) by mouth daily  before breakfast.   [DISCONTINUED] sodium bicarbonate 650 MG tablet Take 1 tablet (650 mg total) by mouth 3 (three) times daily. (Patient not taking: Reported on 02/25/2021)   No facility-administered encounter medications on file as of 02/25/2021.    ALLERGIES: Allergies  Allergen Reactions   Lisinopril Other (See Comments)    Hyperkalemia / Renal failure    VACCINATION STATUS: Immunization History  Administered Date(s) Administered   Influenza,inj,Quad PF,6+ Mos 05/20/2015, 04/07/2017, 03/09/2018, 05/01/2019   Influenza-Unspecified 05/10/2011   PFIZER Comirnaty(Gray Top)Covid-19 Tri-Sucrose Vaccine 07/28/2020, 08/18/2020, 08/18/2020   PPD Test 09/16/2016   Thyroid Problem Presents for follow-up visit. Patient reports no cold intolerance, constipation, depressed mood, heat intolerance, palpitations or weight gain. The symptoms have been stable.   Review of systems  Constitutional: + Minimally fluctuating body weight,  current There is no height or weight on file to calculate BMI. , no fatigue, no subjective hyperthermia, no subjective hypothermia Eyes: no blurry vision, no xerophthalmia ENT: no sore throat, no nodules palpated in throat, no dysphagia/odynophagia, no hoarseness Cardiovascular: no chest pain, no shortness of breath, no palpitations, no leg swelling Respiratory: no cough, no shortness of breath Gastrointestinal: no nausea/vomiting/diarrhea Musculoskeletal: no muscle/joint aches, hx of bilateral AKA Skin: no rashes, no hyperemia Neurological: no tremors, no numbness, no tingling, no dizziness Psychiatric: no depression, no anxiety  Objective:    BP 137/74   Pulse 84   Wt Readings from Last 3 Encounters:  01/10/21 130 lb 4.7 oz (59.1 kg)  12/26/20 128 lb 4.9 oz (58.2 kg)  12/05/20 122 lb 12.7 oz (55.7 kg)    BP Readings from Last 3 Encounters:  02/25/21 137/74  01/15/21 (!) 157/94  12/29/20 (!) 97/56    Review of systems  Constitutional: + Minimally  fluctuating body weight,  current There is no height or weight on file to calculate BMI. , no fatigue, no subjective hyperthermia, no subjective hypothermia Eyes: no  blurry vision, no xerophthalmia ENT: no sore throat, no nodules palpated in throat, no dysphagia/odynophagia, no hoarseness Cardiovascular: no chest pain, no shortness of breath, no palpitations, no leg swelling Respiratory: no cough, no shortness of breath Gastrointestinal: no nausea/vomiting/diarrhea Musculoskeletal: no muscle/joint aches, hx of bilateral AKA- in wheelchair Skin: no rashes, no hyperemia, nicotinic discoloration to fingernails bilaterally Neurological: no tremors, no numbness, no tingling, no dizziness Psychiatric: no depression, no anxiety  Diabetic Labs (most recent): Lab Results  Component Value Date   HGBA1C 6.0 (H) 12/04/2020   HGBA1C 6.0 (H) 08/06/2020   HGBA1C 6.5 01/27/2020     Lipid Panel ( most recent) Lipid Panel     Component Value Date/Time   CHOL 59 (L) 09/22/2020 0923   TRIG 60 09/22/2020 0923   HDL 25 (L) 09/22/2020 0923   CHOLHDL 2.4 09/22/2020 0923   LDLCALC 19 09/22/2020 0923     Recent Results (from the past 2160 hour(s))  CBG monitoring, ED     Status: Abnormal   Collection Time: 12/04/20  7:28 PM  Result Value Ref Range   Glucose-Capillary 27 (LL) 70 - 99 mg/dL    Comment: Glucose reference range applies only to samples taken after fasting for at least 8 hours.   Comment 1 Repeat Test   Glucose, capillary     Status: Abnormal   Collection Time: 12/04/20  7:28 PM  Result Value Ref Range   Glucose-Capillary 27 (LL) 70 - 99 mg/dL    Comment: Glucose reference range applies only to samples taken after fasting for at least 8 hours. QUESTIONABLE RESULTS - CHARGE CREDITED Performed at [facility], [address], [place], [place] [address]    Comment 1 Repeat Test   CBG monitoring, ED     Status: None   Collection Time: 12/04/20  7:30 PM  Result Value Ref Range    Glucose-Capillary 75 70 - 99 mg/dL    Comment: Glucose reference range applies only to samples taken after fasting for at least 8 hours.   Comment 1 Notify RN   CBG monitoring, ED     Status: None   Collection Time: 12/04/20  8:05 PM  Result Value Ref Range   Glucose-Capillary 83 70 - 99 mg/dL    Comment: Glucose reference range applies only to samples taken after fasting for at least 8 hours.  Basic metabolic panel     Status: Abnormal   Collection Time: 12/04/20  8:41 PM  Result Value Ref Range   Sodium 133 (L) 135 - 145 mmol/L   Potassium 3.7 3.5 - 5.1 mmol/L   Chloride 103 98 - 111 mmol/L   CO2 18 (L) 22 - 32 mmol/L   Glucose, Bld 132 (H) 70 - 99 mg/dL    Comment: Glucose reference range applies only to samples taken after fasting for at least 8 hours.   BUN 28 (H) 8 - 23 mg/dL   Creatinine, Ser 2.08 (H) 0.61 - 1.24 mg/dL   Calcium 8.3 (L) 8.9 - 10.3 mg/dL   GFR, Estimated 35 (L) >60 mL/min    Comment: (NOTE) Calculated using the CKD-EPI Creatinine Equation (2021)    Anion gap 12 5 - 15    Comment: Performed at [facility], [address] [place], [place] [address]  CBC with Differential     Status: Abnormal   Collection Time: 12/04/20  8:41 PM  Result Value Ref Range   WBC 9.6 4.0 - 10.5 K/uL   RBC 3.75 (L) 4.22 - 5.81  MIL/uL   Hemoglobin 10.2 (L) 13.0 - 17.0 g/dL   HCT 32.0 (L) 39.0 - 52.0 %   MCV 85.3 80.0 - 100.0 fL   MCH 27.2 26.0 - 34.0 pg   MCHC 31.9 30.0 - 36.0 g/dL   RDW 15.3 11.5 - 15.5 %   Platelets 380 150 - 400 K/uL   nRBC 0.0 0.0 - 0.2 %   Neutrophils Relative % 73 %   Neutro Abs 7.0 1.7 - 7.7 K/uL   Lymphocytes Relative 21 %   Lymphs Abs 2.0 0.7 - 4.0 K/uL   Monocytes Relative 5 %   Monocytes Absolute 0.5 0.1 - 1.0 K/uL   Eosinophils Relative 0 %   Eosinophils Absolute 0.0 0.0 - 0.5 K/uL   Basophils Relative 0 %   Basophils Absolute 0.0 0.0 - 0.1 K/uL   Immature Granulocytes 1 %   Abs Immature Granulocytes 0.08 (H) 0.00 - 0.07 K/uL    Comment:  Performed at [facility], [address], [place], [place] [address]  Troponin I (High Sensitivity)     Status: None   Collection Time: 12/04/20  8:41 PM  Result Value Ref Range   Troponin I (High Sensitivity) 10 <18 ng/L    Comment: (NOTE) Elevated high sensitivity troponin I (hsTnI) values and significant  changes across serial measurements may suggest ACS but many other  chronic and acute conditions are known to elevate hsTnI results.  Refer to the "Links" section for chest pain algorithms and additional  guidance. Performed at [facility], [address] [place], [place] [address]   Magnesium     Status: None   Collection Time: 12/04/20  8:41 PM  Result Value Ref Range   Magnesium 1.7 1.7 - 2.4 mg/dL    Comment: Performed at [facility], [address], [place], [place] [address]  Hemoglobin A1c     Status: Abnormal   Collection Time: 12/04/20  8:41 PM  Result Value Ref Range   Hgb A1c MFr Bld 6.0 (H) 4.8 - 5.6 %    Comment: (NOTE) Pre diabetes:          5.7%-6.4%  Diabetes:              >6.4%  Glycemic control for   <7.0% adults with diabetes    Mean Plasma Glucose 125.5 mg/dL    Comment: Performed at [facility] [address] [place], [place] [address]  Resp Panel by RT-PCR (Flu A&B, Covid) Nasopharyngeal Swab     Status: None   Collection Time: 12/04/20  8:55 PM   Specimen: Nasopharyngeal Swab; Nasopharyngeal(NP) swabs in vial transport medium  Result Value Ref Range   SARS Coronavirus 2 by RT PCR NEGATIVE NEGATIVE    Comment: (NOTE) SARS-CoV-2 target nucleic acids are NOT DETECTED.  The SARS-CoV-2 RNA is generally detectable in upper respiratory specimens during the acute phase of infection. The lowest concentration of SARS-CoV-2 viral copies this assay can detect is 138 copies/mL. A negative result does not preclude SARS-Cov-2 infection and should not be used as the sole basis for treatment or other patient management decisions. A negative result may  occur with  improper specimen collection/handling, submission of specimen other than nasopharyngeal swab, presence of viral mutation(s) within the areas targeted by this assay, and inadequate number of viral copies(<138 copies/mL). A negative result must be combined with clinical observations, patient history, and epidemiological information. The expected result is Negative.  Fact Sheet for Patients:  EntrepreneurPulse.com.au  Fact Sheet for  Healthcare Providers:  IncredibleEmployment.be  This test is no t yet approved or cleared by the [place] and  has been authorized for detection and/or diagnosis of SARS-CoV-2 by FDA under an Emergency Use Authorization (EUA). This EUA will remain  in effect (meaning this test can be used) for the duration of the COVID-19 declaration under Section 564(b)(1) of the Act, 21 U.S.C.section 360bbb-3(b)(1), unless the authorization is terminated  or revoked sooner.       Influenza A by PCR NEGATIVE NEGATIVE   Influenza B by PCR NEGATIVE NEGATIVE    Comment: (NOTE) The Xpert Xpress SARS-CoV-2/FLU/RSV plus assay is intended as an aid in the diagnosis of influenza from Nasopharyngeal swab specimens and should not be used as a sole basis for treatment. Nasal washings and aspirates are unacceptable for Xpert Xpress SARS-CoV-2/FLU/RSV testing.  Fact Sheet for Patients: EntrepreneurPulse.com.au  Fact Sheet for Healthcare Providers: IncredibleEmployment.be  This test is not yet approved or cleared by the [place] FDA and has been authorized for detection and/or diagnosis of SARS-CoV-2 by FDA under an Emergency Use Authorization (EUA). This EUA will remain in effect (meaning this test can be used) for the duration of the COVID-19 declaration under Section 564(b)(1) of the Act, 21 U.S.C. section 360bbb-3(b)(1), unless the authorization is terminated  or revoked.  Performed at [facility], [address] [place], [place] [address]   CBG monitoring, ED (now and then every hour for 3 hours)     Status: Abnormal   Collection Time: 12/04/20  9:25 PM  Result Value Ref Range   Glucose-Capillary 105 (H) 70 - 99 mg/dL    Comment: Glucose reference range applies only to samples taken after fasting for at least 8 hours.   Comment 1 Notify RN    Comment 2 Document in Chart   Troponin I (High Sensitivity)     Status: None   Collection Time: 12/04/20 10:21 PM  Result Value Ref Range   Troponin I (High Sensitivity) 11 <18 ng/L    Comment: (NOTE) Elevated high sensitivity troponin I (hsTnI) values and significant  changes across serial measurements may suggest ACS but many other  chronic and acute conditions are known to elevate hsTnI results.  Refer to the "Links" section for chest pain algorithms and additional  guidance. Performed at [facility], [address] [place], [place] [address]   Urinalysis, Routine w reflex microscopic Urine, Clean Catch     Status: Abnormal   Collection Time: 12/04/20 11:00 PM  Result Value Ref Range   Color, Urine AMBER (A) YELLOW    Comment: BIOCHEMICALS MAY BE AFFECTED BY COLOR   APPearance CLEAR CLEAR   Specific Gravity, Urine 1.015 1.005 - 1.030   pH 5.0 5.0 - 8.0   Glucose, UA NEGATIVE NEGATIVE mg/dL   Hgb urine dipstick SMALL (A) NEGATIVE   Bilirubin Urine SMALL (A) NEGATIVE   Ketones, ur 5 (A) NEGATIVE mg/dL   Protein, ur NEGATIVE NEGATIVE mg/dL   Nitrite NEGATIVE NEGATIVE   Leukocytes,Ua MODERATE (A) NEGATIVE   RBC / HPF 21-50 0 - 5 RBC/hpf   WBC, UA >50 (H) 0 - 5 WBC/hpf   Bacteria, UA RARE (A) NONE SEEN   Squamous Epithelial / LPF 0-5 0 - 5   Hyaline Casts, UA PRESENT     Comment: Performed at [facility], [address] [place], [place] [address]  CBG monitoring, ED (now and then every hour for 3 hours)     Status: None   Collection  Time: 12/04/20 11:34 PM  Result Value Ref Range    Glucose-Capillary 95 70 - 99 mg/dL    Comment: Glucose reference range applies only to samples taken after fasting for at least 8 hours.  MRSA Next Gen by PCR, Nasal     Status: Abnormal   Collection Time: 12/05/20  1:11 AM   Specimen: Nasal Mucosa; Nasal Swab  Result Value Ref Range   MRSA by PCR Next Gen DETECTED (A) NOT DETECTED    Comment: RESULT CALLED TO, READ BACK BY AND VERIFIED WITH: KINDLEY,C ON 12/05/20 BY JUW (NOTE) The GeneXpert MRSA Assay (FDA approved for NASAL specimens only), is one component of a comprehensive MRSA colonization surveillance program. It is not intended to diagnose MRSA infection nor to guide or monitor treatment for MRSA infections. Test performance is not FDA approved in patients less than 40 years old. Performed at [facility], [address] [place], [place] [address]   TSH     Status: Abnormal   Collection Time: 12/05/20  4:12 AM  Result Value Ref Range   TSH 28.476 (H) 0.350 - 4.500 uIU/mL    Comment: Performed by a 3rd Generation assay with a functional sensitivity of <=0.01 uIU/mL. Performed at [facility], [address], [place], [place] [address]   HIV Antibody (routine testing w rflx)     Status: None   Collection Time: 12/05/20  4:12 AM  Result Value Ref Range   HIV Screen 4th Generation wRfx Non Reactive Non Reactive    Comment: Performed at [facility], [place] [address], [place], [place] [address]  Comprehensive metabolic panel     Status: Abnormal   Collection Time: 12/05/20  4:12 AM  Result Value Ref Range   Sodium 135 135 - 145 mmol/L   Potassium 3.1 (L) 3.5 - 5.1 mmol/L   Chloride 108 98 - 111 mmol/L   CO2 17 (L) 22 - 32 mmol/L   Glucose, Bld 94 70 - 99 mg/dL    Comment: Glucose reference range applies only to samples taken after fasting for at least 8 hours.   BUN 27 (H) 8 - 23 mg/dL   Creatinine, Ser 1.72 (H) 0.61 - 1.24 mg/dL   Calcium 7.8 (L) 8.9 - 10.3 mg/dL   Total Protein 7.0 6.5 - 8.1 g/dL   Albumin 2.1 (L) 3.5  - 5.0 g/dL   AST 14 (L) 15 - 41 U/L   ALT 8 0 - 44 U/L   Alkaline Phosphatase 70 38 - 126 U/L   Total Bilirubin 2.1 (H) 0.3 - 1.2 mg/dL   GFR, Estimated 44 (L) >60 mL/min    Comment: (NOTE) Calculated using the CKD-EPI Creatinine Equation (2021)    Anion gap 10 5 - 15    Comment: Performed at [facility], [address], [place], [place] [address]  CBC WITH DIFFERENTIAL     Status: Abnormal   Collection Time: 12/05/20  4:12 AM  Result Value Ref Range   WBC 8.9 4.0 - 10.5 K/uL   RBC 3.84 (L) 4.22 - 5.81 MIL/uL   Hemoglobin 10.3 (L) 13.0 - 17.0 g/dL   HCT 32.8 (L) 39.0 - 52.0 %   MCV 85.4 80.0 - 100.0 fL   MCH 26.8 26.0 - 34.0 pg   MCHC 31.4 30.0 - 36.0 g/dL   RDW 15.4 11.5 - 15.5 %   Platelets 368 150 - 400 K/uL   nRBC 0.0 0.0 - 0.2 %   Neutrophils Relative % 71 %  Neutro Abs 6.3 1.7 - 7.7 K/uL   Lymphocytes Relative 23 %   Lymphs Abs 2.1 0.7 - 4.0 K/uL   Monocytes Relative 5 %   Monocytes Absolute 0.5 0.1 - 1.0 K/uL   Eosinophils Relative 0 %   Eosinophils Absolute 0.0 0.0 - 0.5 K/uL   Basophils Relative 0 %   Basophils Absolute 0.0 0.0 - 0.1 K/uL   Immature Granulocytes 1 %   Abs Immature Granulocytes 0.08 (H) 0.00 - 0.07 K/uL    Comment: Performed at [facility], [address], [place], [place] [address]  Glucose, capillary     Status: None   Collection Time: 12/05/20  4:29 AM  Result Value Ref Range   Glucose-Capillary 85 70 - 99 mg/dL    Comment: Glucose reference range applies only to samples taken after fasting for at least 8 hours.   Comment 1 Notify RN    Comment 2 Document in Chart   Glucose, capillary     Status: None   Collection Time: 12/05/20  7:40 AM  Result Value Ref Range   Glucose-Capillary 96 70 - 99 mg/dL    Comment: Glucose reference range applies only to samples taken after fasting for at least 8 hours.  Urine rapid drug screen (hosp performed)     Status: Abnormal   Collection Time: 12/05/20  8:30 AM  Result Value Ref Range   Opiates NONE  DETECTED NONE DETECTED   Cocaine POSITIVE (A) NONE DETECTED   Benzodiazepines NONE DETECTED NONE DETECTED   Amphetamines NONE DETECTED NONE DETECTED   Tetrahydrocannabinol NONE DETECTED NONE DETECTED   Barbiturates NONE DETECTED NONE DETECTED    Comment: (NOTE) DRUG SCREEN FOR MEDICAL PURPOSES ONLY.  IF CONFIRMATION IS NEEDED FOR ANY PURPOSE, NOTIFY LAB WITHIN 5 DAYS.  LOWEST DETECTABLE LIMITS FOR URINE DRUG SCREEN Drug Class                     Cutoff (ng/mL) Amphetamine and metabolites    1000 Barbiturate and metabolites    200 Benzodiazepine                 779 Tricyclics and metabolites     300 Opiates and metabolites        300 Cocaine and metabolites        300 THC                            50 Performed at [facility], [address], [place], [place] [address]   Glucose, capillary     Status: Abnormal   Collection Time: 12/05/20 12:09 PM  Result Value Ref Range   Glucose-Capillary 105 (H) 70 - 99 mg/dL    Comment: Glucose reference range applies only to samples taken after fasting for at least 8 hours.  Cortisol-am, blood     Status: None   Collection Time: 12/05/20  3:12 PM  Result Value Ref Range   Cortisol - AM 17.0 6.7 - 22.6 ug/dL    Comment: Performed at [facility], [place] [address] [place], [place] [address]  T4, free     Status: None   Collection Time: 12/05/20  3:12 PM  Result Value Ref Range   Free T4 0.69 0.61 - 1.12 ng/dL    Comment: (NOTE) Biotin ingestion may interfere with free T4 tests. If the results are inconsistent with the TSH level, previous test results, or the clinical presentation, then consider  biotin interference. If needed, order repeat testing after stopping biotin. Performed at [facility], [place] [address], [place], [place] [address]   Glucose, capillary     Status: None   Collection Time: 12/05/20  4:48 PM  Result Value Ref Range   Glucose-Capillary 86 70 - 99 mg/dL    Comment: Glucose reference range applies only to  samples taken after fasting for at least 8 hours.  Glucose, capillary     Status: None   Collection Time: 12/05/20  9:33 PM  Result Value Ref Range   Glucose-Capillary 76 70 - 99 mg/dL    Comment: Glucose reference range applies only to samples taken after fasting for at least 8 hours.   Comment 1 Notify RN    Comment 2 Document in Chart   Glucose, capillary     Status: Abnormal   Collection Time: 12/06/20  3:13 AM  Result Value Ref Range   Glucose-Capillary 107 (H) 70 - 99 mg/dL    Comment: Glucose reference range applies only to samples taken after fasting for at least 8 hours.   Comment 1 Notify RN    Comment 2 Document in Chart   Basic metabolic panel     Status: Abnormal   Collection Time: 12/06/20  6:00 AM  Result Value Ref Range   Sodium 132 (L) 135 - 145 mmol/L   Potassium 3.3 (L) 3.5 - 5.1 mmol/L   Chloride 110 98 - 111 mmol/L   CO2 18 (L) 22 - 32 mmol/L   Glucose, Bld 96 70 - 99 mg/dL    Comment: Glucose reference range applies only to samples taken after fasting for at least 8 hours.   BUN 16 8 - 23 mg/dL   Creatinine, Ser 1.19 0.61 - 1.24 mg/dL   Calcium 6.9 (L) 8.9 - 10.3 mg/dL   GFR, Estimated >60 >60 mL/min    Comment: (NOTE) Calculated using the CKD-EPI Creatinine Equation (2021)    Anion gap 4 (L) 5 - 15    Comment: Performed at [facility], [address], [place], [place] [address]  CBC     Status: Abnormal   Collection Time: 12/06/20  6:00 AM  Result Value Ref Range   WBC 7.2 4.0 - 10.5 K/uL   RBC 3.32 (L) 4.22 - 5.81 MIL/uL   Hemoglobin 9.1 (L) 13.0 - 17.0 g/dL   HCT 28.9 (L) 39.0 - 52.0 %   MCV 87.0 80.0 - 100.0 fL   MCH 27.4 26.0 - 34.0 pg   MCHC 31.5 30.0 - 36.0 g/dL   RDW 15.7 (H) 11.5 - 15.5 %   Platelets 328 150 - 400 K/uL   nRBC 0.0 0.0 - 0.2 %    Comment: Performed at [facility], [address] [place], [place] [address]  Magnesium     Status: None   Collection Time: 12/06/20  6:00 AM  Result Value Ref Range   Magnesium 1.8 1.7 - 2.4  mg/dL    Comment: Performed at [facility] [address] [place], [place] [address]  Glucose, capillary     Status: None   Collection Time: 12/06/20  7:31 AM  Result Value Ref Range   Glucose-Capillary 93 70 - 99 mg/dL    Comment: Glucose reference range applies only to samples taken after fasting for at least 8 hours.  Glucose, capillary     Status: None   Collection Time: 12/06/20 11:08 AM  Result Value Ref Range   Glucose-Capillary 85 70 -  99 mg/dL    Comment: Glucose reference range applies only to samples taken after fasting for at least 8 hours.  Comprehensive metabolic panel     Status: Abnormal   Collection Time: 12/26/20  3:45 PM  Result Value Ref Range   Sodium 131 (L) 135 - 145 mmol/L   Potassium 3.5 3.5 - 5.1 mmol/L   Chloride 105 98 - 111 mmol/L   CO2 20 (L) 22 - 32 mmol/L   Glucose, Bld 85 70 - 99 mg/dL    Comment: Glucose reference range applies only to samples taken after fasting for at least 8 hours.   BUN 10 8 - 23 mg/dL   Creatinine, Ser 1.27 (H) 0.61 - 1.24 mg/dL   Calcium 6.0 (LL) 8.9 - 10.3 mg/dL    Comment: CRITICAL RESULT CALLED TO, READ BACK BY AND VERIFIED WITH: ZIFER,M 1634 12/26/2020 COLEMAN,R    Total Protein 6.5 6.5 - 8.1 g/dL   Albumin 1.8 (L) 3.5 - 5.0 g/dL   AST 19 15 - 41 U/L   ALT 8 0 - 44 U/L   Alkaline Phosphatase 75 38 - 126 U/L   Total Bilirubin 0.7 0.3 - 1.2 mg/dL   GFR, Estimated >60 >60 mL/min    Comment: (NOTE) Calculated using the CKD-EPI Creatinine Equation (2021)    Anion gap 6 5 - 15    Comment: Performed at [facility], [address], [place], [place] [address]  Ethanol     Status: None   Collection Time: 12/26/20  3:45 PM  Result Value Ref Range   Alcohol, Ethyl (B) <10 <10 mg/dL    Comment: (NOTE) Lowest detectable limit for serum alcohol is 10 mg/dL.  For medical purposes only. Performed at [facility], [address] [place], [place] [address]   CBC with Differential     Status: Abnormal   Collection Time:  12/26/20  3:45 PM  Result Value Ref Range   WBC 7.0 4.0 - 10.5 K/uL   RBC 3.64 (L) 4.22 - 5.81 MIL/uL   Hemoglobin 9.9 (L) 13.0 - 17.0 g/dL   HCT 30.8 (L) 39.0 - 52.0 %   MCV 84.6 80.0 - 100.0 fL   MCH 27.2 26.0 - 34.0 pg   MCHC 32.1 30.0 - 36.0 g/dL   RDW 16.8 (H) 11.5 - 15.5 %   Platelets 417 (H) 150 - 400 K/uL   nRBC 0.0 0.0 - 0.2 %   Neutrophils Relative % 62 %   Neutro Abs 4.3 1.7 - 7.7 K/uL   Lymphocytes Relative 30 %   Lymphs Abs 2.1 0.7 - 4.0 K/uL   Monocytes Relative 7 %   Monocytes Absolute 0.5 0.1 - 1.0 K/uL   Eosinophils Relative 0 %   Eosinophils Absolute 0.0 0.0 - 0.5 K/uL   Basophils Relative 0 %   Basophils Absolute 0.0 0.0 - 0.1 K/uL   Immature Granulocytes 1 %   Abs Immature Granulocytes 0.04 0.00 - 0.07 K/uL    Comment: Performed at [facility], [address] [place], [place] [address]  Troponin I (High Sensitivity)     Status: None   Collection Time: 12/26/20  3:45 PM  Result Value Ref Range   Troponin I (High Sensitivity) 14 <18 ng/L    Comment: (NOTE) Elevated high sensitivity troponin I (hsTnI) values and significant  changes across serial measurements may suggest ACS but many other  chronic and acute conditions are known to elevate hsTnI results.  Refer to the Links section for chest  pain algorithms and additional  guidance. Performed at [facility], [address], [place], [place] [address]   Urinalysis, Routine w reflex microscopic Urine, Clean Catch     Status: None   Collection Time: 12/26/20  3:48 PM  Result Value Ref Range   Color, Urine YELLOW YELLOW   APPearance CLEAR CLEAR   Specific Gravity, Urine 1.014 1.005 - 1.030   pH 7.0 5.0 - 8.0   Glucose, UA NEGATIVE NEGATIVE mg/dL   Hgb urine dipstick NEGATIVE NEGATIVE   Bilirubin Urine NEGATIVE NEGATIVE   Ketones, ur NEGATIVE NEGATIVE mg/dL   Protein, ur NEGATIVE NEGATIVE mg/dL   Nitrite NEGATIVE NEGATIVE   Leukocytes,Ua NEGATIVE NEGATIVE    Comment: Performed at [facility],  [address], [place], [place] [address]  Urine rapid drug screen (hosp performed)     Status: Abnormal   Collection Time: 12/26/20  3:48 PM  Result Value Ref Range   Opiates POSITIVE (A) NONE DETECTED   Cocaine POSITIVE (A) NONE DETECTED   Benzodiazepines NONE DETECTED NONE DETECTED   Amphetamines NONE DETECTED NONE DETECTED   Tetrahydrocannabinol NONE DETECTED NONE DETECTED   Barbiturates NONE DETECTED NONE DETECTED    Comment: (NOTE) DRUG SCREEN FOR MEDICAL PURPOSES ONLY.  IF CONFIRMATION IS NEEDED FOR ANY PURPOSE, NOTIFY LAB WITHIN 5 DAYS.  LOWEST DETECTABLE LIMITS FOR URINE DRUG SCREEN Drug Class                     Cutoff (ng/mL) Amphetamine and metabolites    1000 Barbiturate and metabolites    200 Benzodiazepine                 470 Tricyclics and metabolites     300 Opiates and metabolites        300 Cocaine and metabolites        300 THC                            50 Performed at [facility], [address], [place], [place] [address]   SARS CORONAVIRUS 2 (TAT 6-24 HRS) Nasopharyngeal Nasopharyngeal Swab     Status: None   Collection Time: 12/26/20  6:53 PM   Specimen: Nasopharyngeal Swab  Result Value Ref Range   SARS Coronavirus 2 NEGATIVE NEGATIVE    Comment: (NOTE) SARS-CoV-2 target nucleic acids are NOT DETECTED.  The SARS-CoV-2 RNA is generally detectable in upper and lower respiratory specimens during the acute phase of infection. Negative results do not preclude SARS-CoV-2 infection, do not rule out co-infections with other pathogens, and should not be used as the sole basis for treatment or other patient management decisions. Negative results must be combined with clinical observations, patient history, and epidemiological information. The expected result is Negative.  Fact Sheet for Patients: SugarRoll.be  Fact Sheet for Healthcare Providers: https://www.woods-mathews.com/  This test is not yet approved or cleared  by the [place] FDA and  has been authorized for detection and/or diagnosis of SARS-CoV-2 by FDA under an Emergency Use Authorization (EUA). This EUA will remain  in effect (meaning this test can be used) for the duration of the COVID-19 declaration under Se ction 564(b)(1) of the Act, 21 U.S.C. section 360bbb-3(b)(1), unless the authorization is terminated or revoked sooner.  Performed at [facility], [place] [address] [place], [place] [address]   Troponin I (High Sensitivity)     Status: None   Collection Time: 12/26/20  7:10  PM  Result Value Ref Range   Troponin I (High Sensitivity) 13 <18 ng/L    Comment: (NOTE) Elevated high sensitivity troponin I (hsTnI) values and significant  changes across serial measurements may suggest ACS but many other  chronic and acute conditions are known to elevate hsTnI results.  Refer to the "Links" section for chest pain algorithms and additional  guidance. Performed at [facility], [address], [place], [place] [address]   TSH     Status: Abnormal   Collection Time: 12/26/20  7:10 PM  Result Value Ref Range   TSH 189.828 (H) 0.350 - 4.500 uIU/mL    Comment: Performed by a 3rd Generation assay with a functional sensitivity of <=0.01 uIU/mL. Performed at [facility], [address], [place], [place] [address]   T3, free     Status: Abnormal   Collection Time: 12/26/20  7:10 PM  Result Value Ref Range   T3, Free 1.0 (L) 2.0 - 4.4 pg/mL    Comment: (NOTE) Performed At: [facility] [place], [place] [id number] Rush Farmer MD QT:[id number]   T4, free     Status: Abnormal   Collection Time: 12/26/20  7:10 PM  Result Value Ref Range   Free T4 0.56 (L) 0.61 - 1.12 ng/dL    Comment: (NOTE) Biotin ingestion may interfere with free T4 tests. If the results are inconsistent with the TSH level, previous test results, or the clinical presentation, then consider biotin interference. If needed, order repeat  testing after stopping biotin. Performed at [facility], [place] [address], [place], [place] [address]   Glucose, capillary     Status: Abnormal   Collection Time: 12/26/20  9:26 PM  Result Value Ref Range   Glucose-Capillary 102 (H) 70 - 99 mg/dL    Comment: Glucose reference range applies only to samples taken after fasting for at least 8 hours.  Comprehensive metabolic panel     Status: Abnormal   Collection Time: 12/27/20  5:55 AM  Result Value Ref Range   Sodium 135 135 - 145 mmol/L   Potassium 3.3 (L) 3.5 - 5.1 mmol/L   Chloride 109 98 - 111 mmol/L   CO2 21 (L) 22 - 32 mmol/L   Glucose, Bld 108 (H) 70 - 99 mg/dL    Comment: Glucose reference range applies only to samples taken after fasting for at least 8 hours.   BUN 13 8 - 23 mg/dL   Creatinine, Ser 1.22 0.61 - 1.24 mg/dL   Calcium 5.8 (LL) 8.9 - 10.3 mg/dL    Comment: CRITICAL RESULT CALLED TO, READ BACK BY AND VERIFIED WITH: WATLINGTON,J AT 7:05AM ON 12/27/20 BY FESTERMAN,C    Total Protein 5.6 (L) 6.5 - 8.1 g/dL   Albumin 1.6 (L) 3.5 - 5.0 g/dL   AST 17 15 - 41 U/L   ALT 8 0 - 44 U/L   Alkaline Phosphatase 69 38 - 126 U/L   Total Bilirubin 0.6 0.3 - 1.2 mg/dL   GFR, Estimated >60 >60 mL/min    Comment: (NOTE) Calculated using the CKD-EPI Creatinine Equation (2021)    Anion gap 5 5 - 15    Comment: Performed at [facility], [address], [place], [place] [address]  Glucose, capillary     Status: Abnormal   Collection Time: 12/27/20  7:37 AM  Result Value Ref Range   Glucose-Capillary 102 (H) 70 - 99 mg/dL    Comment: Glucose reference range applies only to samples taken  after fasting for at least 8 hours.  Glucose, capillary     Status: None   Collection Time: 12/27/20 12:10 PM  Result Value Ref Range   Glucose-Capillary 89 70 - 99 mg/dL    Comment: Glucose reference range applies only to samples taken after fasting for at least 8 hours.  ECHOCARDIOGRAM COMPLETE     Status: None   Collection Time:  12/27/20  3:16 PM  Result Value Ref Range   Weight 2,052.92 oz   Height 68 in   BP 108/64 mmHg   Area-P 1/2 2.48 cm2   S' Lateral 2.98 cm  Glucose, capillary     Status: Abnormal   Collection Time: 12/27/20  4:37 PM  Result Value Ref Range   Glucose-Capillary 117 (H) 70 - 99 mg/dL    Comment: Glucose reference range applies only to samples taken after fasting for at least 8 hours.  Glucose, capillary     Status: Abnormal   Collection Time: 12/27/20  9:20 PM  Result Value Ref Range   Glucose-Capillary 114 (H) 70 - 99 mg/dL    Comment: Glucose reference range applies only to samples taken after fasting for at least 8 hours.   Comment 1 Notify RN    Comment 2 Document in Chart   Glucose, capillary     Status: Abnormal   Collection Time: 12/28/20  7:34 AM  Result Value Ref Range   Glucose-Capillary 117 (H) 70 - 99 mg/dL    Comment: Glucose reference range applies only to samples taken after fasting for at least 8 hours.  CBC     Status: Abnormal   Collection Time: 12/28/20  8:58 AM  Result Value Ref Range   WBC 8.3 4.0 - 10.5 K/uL   RBC 3.03 (L) 4.22 - 5.81 MIL/uL   Hemoglobin 8.3 (L) 13.0 - 17.0 g/dL   HCT 25.9 (L) 39.0 - 52.0 %   MCV 85.5 80.0 - 100.0 fL   MCH 27.4 26.0 - 34.0 pg   MCHC 32.0 30.0 - 36.0 g/dL   RDW 17.4 (H) 11.5 - 15.5 %   Platelets 382 150 - 400 K/uL   nRBC 0.0 0.0 - 0.2 %    Comment: Performed at [facility], [address], [place], [place] [address]  Renal function panel     Status: Abnormal   Collection Time: 12/28/20  8:58 AM  Result Value Ref Range   Sodium 135 135 - 145 mmol/L   Potassium 3.7 3.5 - 5.1 mmol/L   Chloride 112 (H) 98 - 111 mmol/L   CO2 18 (L) 22 - 32 mmol/L   Glucose, Bld 111 (H) 70 - 99 mg/dL    Comment: Glucose reference range applies only to samples taken after fasting for at least 8 hours.   BUN 12 8 - 23 mg/dL   Creatinine, Ser 1.28 (H) 0.61 - 1.24 mg/dL   Calcium 5.8 (LL) 8.9 - 10.3 mg/dL    Comment: CRITICAL RESULT CALLED  TO, READ BACK BY AND VERIFIED WITH: SURLES,M AT 9:25AM ON 12/28/20 BY FESTERMAN,C    Phosphorus <1.0 (LL) 2.5 - 4.6 mg/dL    Comment: CRITICAL RESULT CALLED TO, READ BACK BY AND VERIFIED WITH: SURLES,M AT 9:25AM ON 12/28/20 BY FESTERMAN,C    Albumin 1.6 (L) 3.5 - 5.0 g/dL   GFR, Estimated >60 >60 mL/min    Comment: (NOTE) Calculated using the CKD-EPI Creatinine Equation (2021)    Anion gap 5 5 - 15    Comment: Performed at  [place] [place], [place] [address]  Magnesium     Status: Abnormal   Collection Time: 12/28/20  8:58 AM  Result Value Ref Range   Magnesium 1.1 (L) 1.7 - 2.4 mg/dL    Comment: Performed at [facility], [address] [place], [place] [address]  Glucose, capillary     Status: Abnormal   Collection Time: 12/28/20 11:53 AM  Result Value Ref Range   Glucose-Capillary 109 (H) 70 - 99 mg/dL    Comment: Glucose reference range applies only to samples taken after fasting for at least 8 hours.  Glucose, capillary     Status: None   Collection Time: 12/28/20  5:23 PM  Result Value Ref Range   Glucose-Capillary 93 70 - 99 mg/dL    Comment: Glucose reference range applies only to samples taken after fasting for at least 8 hours.  Glucose, capillary     Status: Abnormal   Collection Time: 12/28/20  8:56 PM  Result Value Ref Range   Glucose-Capillary 112 (H) 70 - 99 mg/dL    Comment: Glucose reference range applies only to samples taken after fasting for at least 8 hours.   Comment 1 Notify RN    Comment 2 Document in Chart   Renal function panel     Status: Abnormal   Collection Time: 12/29/20  4:12 AM  Result Value Ref Range   Sodium 135 135 - 145 mmol/L   Potassium 3.9 3.5 - 5.1 mmol/L   Chloride 108 98 - 111 mmol/L   CO2 18 (L) 22 - 32 mmol/L   Glucose, Bld 135 (H) 70 - 99 mg/dL    Comment: Glucose reference range applies only to samples taken after fasting for at least 8 hours.   BUN 13 8 - 23 mg/dL   Creatinine, Ser 1.25 (H) 0.61 - 1.24  mg/dL   Calcium 5.9 (LL) 8.9 - 10.3 mg/dL    Comment: CRITICAL RESULT CALLED TO, READ BACK BY AND VERIFIED WITH: MANUEL,R AT 5:15AM ON 12/29/20 BY FESTERMAN,C    Phosphorus 4.0 2.5 - 4.6 mg/dL   Albumin 1.7 (L) 3.5 - 5.0 g/dL   GFR, Estimated >60 >60 mL/min    Comment: (NOTE) Calculated using the CKD-EPI Creatinine Equation (2021)    Anion gap 9 5 - 15    Comment: Performed at [facility], [address], [place], [place] [address]  Magnesium     Status: Abnormal   Collection Time: 12/29/20  4:12 AM  Result Value Ref Range   Magnesium 2.8 (H) 1.7 - 2.4 mg/dL    Comment: Performed at [facility], [address] [place], [place] [address]  Glucose, capillary     Status: None   Collection Time: 12/29/20  7:41 AM  Result Value Ref Range   Glucose-Capillary 97 70 - 99 mg/dL    Comment: Glucose reference range applies only to samples taken after fasting for at least 8 hours.  Glucose, capillary     Status: None   Collection Time: 12/29/20 11:10 AM  Result Value Ref Range   Glucose-Capillary 92 70 - 99 mg/dL    Comment: Glucose reference range applies only to samples taken after fasting for at least 8 hours.  Glucose, capillary     Status: Abnormal   Collection Time: 12/29/20  4:19 PM  Result Value Ref Range   Glucose-Capillary 128 (H) 70 - 99 mg/dL    Comment: Glucose reference range applies only to samples taken after fasting for  at least 8 hours.   Comment 1 Notify RN    Comment 2 Document in Chart   CBC with Differential/Platelet     Status: Abnormal   Collection Time: 01/10/21  8:47 AM  Result Value Ref Range   WBC 13.2 (H) 4.0 - 10.5 K/uL   RBC 2.94 (L) 4.22 - 5.81 MIL/uL   Hemoglobin 7.8 (L) 13.0 - 17.0 g/dL   HCT 24.3 (L) 39.0 - 52.0 %   MCV 82.7 80.0 - 100.0 fL   MCH 26.5 26.0 - 34.0 pg   MCHC 32.1 30.0 - 36.0 g/dL   RDW 16.5 (H) 11.5 - 15.5 %   Platelets 406 (H) 150 - 400 K/uL   nRBC 0.0 0.0 - 0.2 %   Neutrophils Relative % 70 %   Neutro Abs 9.3 (H) 1.7 - 7.7 K/uL    Lymphocytes Relative 21 %   Lymphs Abs 2.8 0.7 - 4.0 K/uL   Monocytes Relative 5 %   Monocytes Absolute 0.7 0.1 - 1.0 K/uL   Eosinophils Relative 2 %   Eosinophils Absolute 0.3 0.0 - 0.5 K/uL   Basophils Relative 1 %   Basophils Absolute 0.1 0.0 - 0.1 K/uL   Immature Granulocytes 1 %   Abs Immature Granulocytes 0.07 0.00 - 0.07 K/uL    Comment: Performed at [facility], [address] [place], [place] [address]  Comprehensive metabolic panel     Status: Abnormal   Collection Time: 01/10/21  8:47 AM  Result Value Ref Range   Sodium 141 135 - 145 mmol/L   Potassium 2.7 (LL) 3.5 - 5.1 mmol/L    Comment: CRITICAL RESULT CALLED TO, READ BACK BY AND VERIFIED WITH: TINA TALBOTT,RN @0921  01/10/2021 KAY    Chloride 109 98 - 111 mmol/L   CO2 24 22 - 32 mmol/L   Glucose, Bld 94 70 - 99 mg/dL    Comment: Glucose reference range applies only to samples taken after fasting for at least 8 hours.   BUN 13 8 - 23 mg/dL   Creatinine, Ser 0.93 0.61 - 1.24 mg/dL   Calcium 5.2 (LL) 8.9 - 10.3 mg/dL    Comment: CRITICAL RESULT CALLED TO, READ BACK BY AND VERIFIED WITH: TINA TALBOTT,RN @0921  01/10/2021 KAY    Total Protein 6.3 (L) 6.5 - 8.1 g/dL   Albumin 1.7 (L) 3.5 - 5.0 g/dL   AST 15 15 - 41 U/L   ALT 9 0 - 44 U/L   Alkaline Phosphatase 86 38 - 126 U/L   Total Bilirubin 0.8 0.3 - 1.2 mg/dL   GFR, Estimated >60 >60 mL/min    Comment: (NOTE) Calculated using the CKD-EPI Creatinine Equation (2021)    Anion gap 8 5 - 15    Comment: Performed at [facility], [address] [place], [place] [address]  Magnesium     Status: Abnormal   Collection Time: 01/10/21  8:55 AM  Result Value Ref Range   Magnesium 1.2 (L) 1.7 - 2.4 mg/dL    Comment: Performed at [facility], [address], [place],  [address]  Resp Panel by RT-PCR (Flu A&B, Covid) Nasopharyngeal Swab     Status: None   Collection Time: 01/10/21  3:21 PM   Specimen: Nasopharyngeal Swab; Nasopharyngeal(NP) swabs in vial  transport medium  Result Value Ref Range   SARS Coronavirus 2 by RT PCR NEGATIVE NEGATIVE    Comment: (NOTE) SARS-CoV-2 target nucleic acids are NOT DETECTED.  The SARS-CoV-2 RNA is generally detectable in upper  respiratory specimens during the acute phase of infection. The lowest concentration of SARS-CoV-2 viral copies this assay can detect is 138 copies/mL. A negative result does not preclude SARS-Cov-2 infection and should not be used as the sole basis for treatment or other patient management decisions. A negative result may occur with  improper specimen collection/handling, submission of specimen other than nasopharyngeal swab, presence of viral mutation(s) within the areas targeted by this assay, and inadequate number of viral copies(<138 copies/mL). A negative result must be combined with clinical observations, patient history, and epidemiological information. The expected result is Negative.  Fact Sheet for Patients:  EntrepreneurPulse.com.au  Fact Sheet for Healthcare Providers:  IncredibleEmployment.be  This test is no t yet approved or cleared by the [place] FDA and  has been authorized for detection and/or diagnosis of SARS-CoV-2 by FDA under an Emergency Use Authorization (EUA). This EUA will remain  in effect (meaning this test can be used) for the duration of the COVID-19 declaration under Section 564(b)(1) of the Act, 21 U.S.C.section 360bbb-3(b)(1), unless the authorization is terminated  or revoked sooner.       Influenza A by PCR NEGATIVE NEGATIVE   Influenza B by PCR NEGATIVE NEGATIVE    Comment: (NOTE) The Xpert Xpress SARS-CoV-2/FLU/RSV plus assay is intended as an aid in the diagnosis of influenza from Nasopharyngeal swab specimens and should not be used as a sole basis for treatment. Nasal washings and aspirates are unacceptable for Xpert Xpress SARS-CoV-2/FLU/RSV testing.  Fact Sheet for  Patients: EntrepreneurPulse.com.au  Fact Sheet for Healthcare Providers: IncredibleEmployment.be  This test is not yet approved or cleared by the [place] FDA and has been authorized for detection and/or diagnosis of SARS-CoV-2 by FDA under an Emergency Use Authorization (EUA). This EUA will remain in effect (meaning this test can be used) for the duration of the COVID-19 declaration under Section 564(b)(1) of the Act, 21 U.S.C. section 360bbb-3(b)(1), unless the authorization is terminated or revoked.  Performed at [facility], [address] [place], [place] [address]   CBC     Status: Abnormal   Collection Time: 01/11/21  5:09 AM  Result Value Ref Range   WBC 11.0 (H) 4.0 - 10.5 K/uL   RBC 2.49 (L) 4.22 - 5.81 MIL/uL   Hemoglobin 6.7 (LL) 13.0 - 17.0 g/dL    Comment: REPEATED TO VERIFY THIS CRITICAL RESULT HAS VERIFIED AND BEEN CALLED TO BROWN,PHEOBIE BY SHERRI HUFFINES ON 08 01 2022 AT 0638, AND HAS BEEN READ BACK.     HCT 21.0 (L) 39.0 - 52.0 %   MCV 84.3 80.0 - 100.0 fL   MCH 26.9 26.0 - 34.0 pg   MCHC 31.9 30.0 - 36.0 g/dL   RDW 16.6 (H) 11.5 - 15.5 %   Platelets 385 150 - 400 K/uL   nRBC 0.0 0.0 - 0.2 %    Comment: Performed at [facility], [address], [place], [place] [address]  T4, free     Status: Abnormal   Collection Time: 01/11/21  5:09 AM  Result Value Ref Range   Free T4 1.78 (H) 0.61 - 1.12 ng/dL    Comment: (NOTE) Biotin ingestion may interfere with free T4 tests. If the results are inconsistent with the TSH level, previous test results, or the clinical presentation, then consider biotin interference. If needed, order repeat testing after stopping biotin. Performed at [facility], [place] [address], [place], [place] [address]   TSH     Status: None  Collection Time: 01/11/21  5:09 AM  Result Value Ref Range   TSH 0.805 0.350 - 4.500 uIU/mL    Comment: Performed by a 3rd Generation assay with a  functional sensitivity of <=0.01 uIU/mL. Performed at [facility], [address], [place], [place] [address]   Iron and TIBC     Status: Abnormal   Collection Time: 01/11/21  5:09 AM  Result Value Ref Range   Iron 30 (L) 45 - 182 ug/dL   TIBC 145 (L) 250 - 450 ug/dL   Saturation Ratios 21 17.9 - 39.5 %   UIBC 115 ug/dL    Comment: Performed at [facility], [address], [place], [place] [address]  Vitamin B12     Status: None   Collection Time: 01/11/21  5:09 AM  Result Value Ref Range   Vitamin B-12 395 180 - 914 pg/mL    Comment: (NOTE) This assay is not validated for testing neonatal or myeloproliferative syndrome specimens for Vitamin B12 levels. Performed at [facility], [address], [place], [place] [address]   Folate     Status: None   Collection Time: 01/11/21  5:09 AM  Result Value Ref Range   Folate 29.1 >5.9 ng/mL    Comment: RESULTS CONFIRMED BY MANUAL DILUTION Performed at [facility], [address], [place], [place] [address]   Ferritin     Status: Abnormal   Collection Time: 01/11/21  5:09 AM  Result Value Ref Range   Ferritin 352 (H) 24 - 336 ng/mL    Comment: Performed at [facility], [address] [place], [place] [address]  Renal function panel     Status: Abnormal   Collection Time: 01/11/21  5:09 AM  Result Value Ref Range   Sodium 137 135 - 145 mmol/L   Potassium 3.9 3.5 - 5.1 mmol/L    Comment: DELTA CHECK NOTED   Chloride 112 (H) 98 - 111 mmol/L   CO2 18 (L) 22 - 32 mmol/L   Glucose, Bld 240 (H) 70 - 99 mg/dL    Comment: Glucose reference range applies only to samples taken after fasting for at least 8 hours.   BUN 19 8 - 23 mg/dL   Creatinine, Ser 1.23 0.61 - 1.24 mg/dL   Calcium 5.1 (LL) 8.9 - 10.3 mg/dL    Comment: CRITICAL RESULT CALLED TO, READ BACK BY AND VERIFIED WITH: BROWN, P AT 0649 ON 8.1.22 BY RUCINSKI,B    Phosphorus 4.2 2.5 - 4.6 mg/dL   Albumin 1.6 (L) 3.5 - 5.0 g/dL   GFR, Estimated >60 >60 mL/min    Comment:  (NOTE) Calculated using the CKD-EPI Creatinine Equation (2021)    Anion gap 7 5 - 15    Comment: Performed at [facility], [address], [place], [place] [address]  Magnesium     Status: None   Collection Time: 01/11/21  5:09 AM  Result Value Ref Range   Magnesium 2.4 1.7 - 2.4 mg/dL    Comment: Performed at [facility], [address] [place],  [address]  Urinalysis, Routine w reflex microscopic Urine, Clean Catch     Status: Abnormal   Collection Time: 01/11/21 10:30 AM  Result Value Ref Range   Color, Urine AMBER (A) YELLOW    Comment: BIOCHEMICALS MAY BE AFFECTED BY COLOR   APPearance CLOUDY (A) CLEAR   Specific Gravity, Urine 1.017 1.005 - 1.030   pH 5.0 5.0 - 8.0   Glucose, UA NEGATIVE NEGATIVE mg/dL   Hgb urine  dipstick SMALL (A) NEGATIVE   Bilirubin Urine NEGATIVE NEGATIVE   Ketones, ur NEGATIVE NEGATIVE mg/dL   Protein, ur 30 (A) NEGATIVE mg/dL   Nitrite NEGATIVE NEGATIVE   Leukocytes,Ua MODERATE (A) NEGATIVE   RBC / HPF 0-5 0 - 5 RBC/hpf   WBC, UA 0-5 0 - 5 WBC/hpf   Bacteria, UA RARE (A) NONE SEEN   Squamous Epithelial / LPF 0-5 0 - 5   Mucus PRESENT    Hyaline Casts, UA PRESENT    Uric Acid Crys, UA PRESENT     Comment: Performed at [facility], [address] [place], [place] [address]  Urine rapid drug screen (hosp performed)     Status: None   Collection Time: 01/11/21 10:30 AM  Result Value Ref Range   Opiates NONE DETECTED NONE DETECTED   Cocaine NONE DETECTED NONE DETECTED   Benzodiazepines NONE DETECTED NONE DETECTED   Amphetamines NONE DETECTED NONE DETECTED   Tetrahydrocannabinol NONE DETECTED NONE DETECTED   Barbiturates NONE DETECTED NONE DETECTED    Comment: (NOTE) DRUG SCREEN FOR MEDICAL PURPOSES ONLY.  IF CONFIRMATION IS NEEDED FOR ANY PURPOSE, NOTIFY LAB WITHIN 5 DAYS.  LOWEST DETECTABLE LIMITS FOR URINE DRUG SCREEN Drug Class                     Cutoff (ng/mL) Amphetamine and metabolites    1000 Barbiturate and metabolites     200 Benzodiazepine                 330 Tricyclics and metabolites     300 Opiates and metabolites        300 Cocaine and metabolites        300 THC                            50 Performed at [facility], [address] [place], [place] [address]   Urine Culture     Status: Abnormal   Collection Time: 01/11/21 10:32 AM   Specimen: Urine, Clean Catch  Result Value Ref Range   Specimen Description      URINE, CLEAN CATCH Performed at [facility], [address], [place], [place] [address]    Special Requests      NONE Performed at [facility], [address], [place], [place] [address]    Culture (A)     50,000 COLONIES/mL ESCHERICHIA COLI Confirmed Extended Spectrum Beta-Lactamase Producer (ESBL).  In bloodstream infections from ESBL organisms, carbapenems are preferred over piperacillin/tazobactam. They are shown to have a lower risk of mortality.    Report Status 01/14/2021 FINAL    Organism ID, Bacteria ESCHERICHIA COLI (A)       Susceptibility   Escherichia coli - MIC*    AMPICILLIN >=32 RESISTANT Resistant     CEFAZOLIN >=64 RESISTANT Resistant     CEFEPIME 16 RESISTANT Resistant     CEFTRIAXONE >=64 RESISTANT Resistant     CIPROFLOXACIN >=4 RESISTANT Resistant     GENTAMICIN <=1 SENSITIVE Sensitive     IMIPENEM <=0.25 SENSITIVE Sensitive     NITROFURANTOIN 64 INTERMEDIATE Intermediate     TRIMETH/SULFA <=20 SENSITIVE Sensitive     AMPICILLIN/SULBACTAM >=32 RESISTANT Resistant     PIP/TAZO <=4 SENSITIVE Sensitive     * 50,000 COLONIES/mL ESCHERICHIA COLI  Occult blood card to lab, stool RN will collect     Status: Abnormal   Collection Time: 01/11/21 10:50 AM  Result Value Ref  Range   Fecal Occult Bld POSITIVE (A) NEGATIVE    Comment: Performed at [facility], [address], [place], [place] [address]  Glucose, capillary     Status: Abnormal   Collection Time: 01/11/21 11:56 AM  Result Value Ref Range   Glucose-Capillary 139 (H) 70 - 99 mg/dL    Comment: Glucose  reference range applies only to samples taken after fasting for at least 8 hours.  Type and screen [facility]     Status: None   Collection Time: 01/11/21  1:19 PM  Result Value Ref Range   ABO/RH(D) B POS    Antibody Screen POS    Sample Expiration 01/14/2021,2359    Antibody Identification NO CLINICALLY SIGNIFICANT ANTIBODY IDENTIFIED    Unit Number [id number]    Blood Component Type RED CELLS,LR    Unit division 0    Status of Unit ISSUED,FINAL    Transfusion Status OK TO TRANSFUSE    Crossmatch Result COMPATIBLE    Unit Number [id number]    Blood Component Type RED CELLS,LR    Unit division 0    Status of Unit ISSUED,FINAL    Transfusion Status OK TO TRANSFUSE    Crossmatch Result COMPATIBLE   BPAM RBC     Status: None   Collection Time: 01/11/21  1:19 PM  Result Value Ref Range   ISSUE DATE / TIME [id number]    Blood Product Unit Number [id number]    PRODUCT CODE [id number]    Unit Type and Rh 9500    Blood Product Expiration Date 793903009233    ISSUE DATE / TIME [id number]    Blood Product Unit Number [id number]    PRODUCT CODE [id number]    Unit Type and Rh 1700    Blood Product Expiration Date [id number]   Glucose, capillary     Status: Abnormal   Collection Time: 01/11/21  4:22 PM  Result Value Ref Range   Glucose-Capillary 180 (H) 70 - 99 mg/dL    Comment: Glucose reference range applies only to samples taken after fasting for at least 8 hours.  Glucose, capillary     Status: Abnormal   Collection Time: 01/11/21 10:36 PM  Result Value Ref Range   Glucose-Capillary 291 (H) 70 - 99 mg/dL    Comment: Glucose reference range applies only to samples taken after fasting for at least 8 hours.  Renal function panel     Status: Abnormal   Collection Time: 01/12/21  5:09 AM  Result Value Ref Range   Sodium 143 135 - 145 mmol/L   Potassium 3.7 3.5 - 5.1 mmol/L   Chloride 114 (H) 98 - 111 mmol/L   CO2 22 22 - 32 mmol/L   Glucose, Bld 122 (H) 70  - 99 mg/dL    Comment: Glucose reference range applies only to samples taken after fasting for at least 8 hours.   BUN 16 8 - 23 mg/dL   Creatinine, Ser 1.22 0.61 - 1.24 mg/dL   Calcium 5.2 (LL) 8.9 - 10.3 mg/dL    Comment: CRITICAL RESULT CALLED TO, READ BACK BY AND VERIFIED WITH: THOMAS,C AT 5:50AM ON 01/12/21 BY [facility]    Phosphorus 4.2 2.5 - 4.6 mg/dL   Albumin 1.6 (L) 3.5 - 5.0 g/dL   GFR, Estimated >60 >60 mL/min    Comment: (NOTE) Calculated using the CKD-EPI Creatinine Equation (2021)    Anion gap 7 5 - 15    Comment: Performed at [facility], [address], [place], [place]  [address]  CBC     Status: Abnormal   Collection Time: 01/12/21  5:09 AM  Result Value Ref Range   WBC 12.5 (H) 4.0 - 10.5 K/uL   RBC 2.52 (L) 4.22 - 5.81 MIL/uL   Hemoglobin 6.6 (LL) 13.0 - 17.0 g/dL    Comment: This critical result has verified and been called to BROWN,PHEOBE by Senaida Lange on 08 02 2022 at [facility], and has been read back.    HCT 21.4 (L) 39.0 - 52.0 %   MCV 84.9 80.0 - 100.0 fL   MCH 26.2 26.0 - 34.0 pg   MCHC 30.8 30.0 - 36.0 g/dL   RDW 16.4 (H) 11.5 - 15.5 %   Platelets 424 (H) 150 - 400 K/uL   nRBC 0.0 0.0 - 0.2 %    Comment: Performed at [facility], [address], [place], [place] [address]  Glucose, capillary     Status: Abnormal   Collection Time: 01/12/21  7:28 AM  Result Value Ref Range   Glucose-Capillary 178 (H) 70 - 99 mg/dL    Comment: Glucose reference range applies only to samples taken after fasting for at least 8 hours.  Glucose, capillary     Status: Abnormal   Collection Time: 01/12/21 10:48 AM  Result Value Ref Range   Glucose-Capillary 50 (L) 70 - 99 mg/dL    Comment: Glucose reference range applies only to samples taken after fasting for at least 8 hours.  Glucose, capillary     Status: Abnormal   Collection Time: 01/12/21 11:59 AM  Result Value Ref Range   Glucose-Capillary 64 (L) 70 - 99 mg/dL    Comment: Glucose reference range applies only  to samples taken after fasting for at least 8 hours.  Glucose, capillary     Status: Abnormal   Collection Time: 01/12/21  4:10 PM  Result Value Ref Range   Glucose-Capillary 186 (H) 70 - 99 mg/dL    Comment: Glucose reference range applies only to samples taken after fasting for at least 8 hours.  Glucose, capillary     Status: Abnormal   Collection Time: 01/12/21  8:06 PM  Result Value Ref Range   Glucose-Capillary 114 (H) 70 - 99 mg/dL    Comment: Glucose reference range applies only to samples taken after fasting for at least 8 hours.  CBC     Status: Abnormal   Collection Time: 01/13/21  5:48 AM  Result Value Ref Range   WBC 13.8 (H) 4.0 - 10.5 K/uL   RBC 3.94 (L) 4.22 - 5.81 MIL/uL   Hemoglobin 10.9 (L) 13.0 - 17.0 g/dL    Comment: POST TRANSFUSION SPECIMEN   HCT 32.8 (L) 39.0 - 52.0 %   MCV 83.2 80.0 - 100.0 fL   MCH 27.7 26.0 - 34.0 pg   MCHC 33.2 30.0 - 36.0 g/dL   RDW 16.1 (H) 11.5 - 15.5 %   Platelets 442 (H) 150 - 400 K/uL   nRBC 0.0 0.0 - 0.2 %    Comment: Performed at [facility], [address] [place], [place] [address]  Renal function panel     Status: Abnormal   Collection Time: 01/13/21  5:48 AM  Result Value Ref Range   Sodium 137 135 - 145 mmol/L   Potassium 4.6 3.5 - 5.1 mmol/L    Comment: DELTA CHECK NOTED   Chloride 105 98 - 111 mmol/L   CO2 22 22 - 32 mmol/L   Glucose, Bld 88 70 - 99  mg/dL    Comment: Glucose reference range applies only to samples taken after fasting for at least 8 hours.   BUN 10 8 - 23 mg/dL   Creatinine, Ser 1.19 0.61 - 1.24 mg/dL   Calcium 5.7 (LL) 8.9 - 10.3 mg/dL    Comment: CRITICAL RESULT CALLED TO, READ BACK BY AND VERIFIED WITH: PARSONS,L AT 6:20AM ON 01/13/21 BY FESTERMAN,C    Phosphorus 5.3 (H) 2.5 - 4.6 mg/dL   Albumin 1.8 (L) 3.5 - 5.0 g/dL   GFR, Estimated >60 >60 mL/min    Comment: (NOTE) Calculated using the CKD-EPI Creatinine Equation (2021)    Anion gap 10 5 - 15    Comment: Performed at [facility], [address], [place], [place] [address]  Magnesium     Status: Abnormal   Collection Time: 01/13/21  5:48 AM  Result Value Ref Range   Magnesium 1.4 (L) 1.7 - 2.4 mg/dL    Comment: Performed at [facility], [address], [place], [place] [address]  Glucose, capillary     Status: None   Collection Time: 01/13/21  7:24 AM  Result Value Ref Range   Glucose-Capillary 75 70 - 99 mg/dL    Comment: Glucose reference range applies only to samples taken after fasting for at least 8 hours.  KOH prep     Status: None   Collection Time: 01/13/21 11:53 AM   Specimen: PATH GI Other  Result Value Ref Range   Specimen Description ESOPHAGUS    Special Requests NONE    KOH Prep      FEW YEAST Performed at [facility], [address], [place], [place] [address]    Report Status 01/13/2021 FINAL   Surgical pathology     Status: None   Collection Time: 01/13/21 12:21 PM  Result Value Ref Range   SURGICAL PATHOLOGY      SURGICAL PATHOLOGY CASE: [id number] PATIENT: Todd [name] Surgical Pathology Report     Clinical History: anemia, heme positive stool   FINAL MICROSCOPIC DIAGNOSIS:  A. ANTRUM, BIOPSY: -  Gastric antral and oxyntic mucosa with chronic gastritis -  No H. pylori or intestinal metaplasia identified -  See comment  B. 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 COLON, SPLENIC FLEXURE, SIGMOID, POLYPECTOMY: - Tubular adenoma(s) - Negative for high-grade dysplasia or malignancy -  See comment  COMMENT:  A.  H. pylori immunohistochemistry is NEGATIVE for microorganisms.  B.  HSV-2 immunohistochemistry is NEGATIVE.  C.  Dr. Vic Ripper reviewed the case and agrees with the above diagnosis.   GROSS DESCRIPTION:  A.  Received in formalin labeled with the patients name and DOB are four 0.3-0.6 cm pieces of tan soft tissue, submitted in toto in a single cassette.   B.   Received in formalin labeled with the patients name and DOB are four 0.2-0.3 cm pieces of tan soft tissue, submitted in toto in a single cassette.  C.  Received in formalin labeled with the patient's name and DOB are four 0.3-0.6 cm tan, rubbery, polypoid pieces of tissue. The larger pieces are sectioned and the tissue is entirely submitted in a single cassette.  (LEF 01/13/2021)    Final Diagnosis performed by Thressa Sheller, MD.   Electronically signed 01/18/2021 Technical component performed at [facility], [place] [address] [place],  [address].  Professional component performed at [facility], [address] [place],  [place] [address].  Immunohistochemistry Technical component (if applicable) was performed at [facility] [address], [place], [place], [place] [address].   IMMUNOHISTOCHEMISTRY DISCLAIMER (if applicable): Some of these immunohistochemical stains may have been developed and the p erformance characteristics determine by [facility]. Some may not have been cleared or approved by the U.S. Food and Drug Administration. The FDA has determined that such clearance or approval is not necessary. This test is used for clinical purposes. It should not be regarded as investigational or for research. This laboratory is certified under the [place] (CLIA-88) as qualified to perform high complexity clinical laboratory testing.  The controls stained appropriately.   Glucose, capillary     Status: Abnormal   Collection Time: 01/13/21  1:01 PM  Result Value Ref Range   Glucose-Capillary 69 (L) 70 - 99 mg/dL    Comment: Glucose reference range applies only to samples taken after fasting for at least 8 hours.  Glucose, capillary     Status: Abnormal   Collection Time: 01/13/21  1:23 PM  Result Value Ref Range   Glucose-Capillary 141 (H) 70 - 99 mg/dL    Comment:  Glucose reference range applies only to samples taken after fasting for at least 8 hours.  Glucose, capillary     Status: Abnormal   Collection Time: 01/13/21  8:09 PM  Result Value Ref Range   Glucose-Capillary 142 (H) 70 - 99 mg/dL    Comment: Glucose reference range applies only to samples taken after fasting for at least 8 hours.  Glucose, capillary     Status: Abnormal   Collection Time: 01/14/21  7:37 AM  Result Value Ref Range   Glucose-Capillary 116 (H) 70 - 99 mg/dL    Comment: Glucose reference range applies only to samples taken after fasting for at least 8 hours.  Glucose, capillary     Status: Abnormal   Collection Time: 01/14/21 11:36 AM  Result Value Ref Range   Glucose-Capillary 121 (H) 70 - 99 mg/dL    Comment: Glucose reference range applies only to samples taken after fasting for at least 8 hours.  Glucose, capillary     Status: Abnormal   Collection Time: 01/14/21  3:53 PM  Result Value Ref Range   Glucose-Capillary 118 (H) 70 - 99 mg/dL    Comment: Glucose reference range applies only to samples taken after fasting for at least 8 hours.  Glucose, capillary     Status: Abnormal   Collection Time: 01/14/21  9:51 PM  Result Value Ref Range   Glucose-Capillary 166 (H) 70 - 99 mg/dL    Comment: Glucose reference range applies only to samples taken after fasting for at least 8 hours.  Glucose, capillary     Status: Abnormal   Collection Time: 01/15/21  7:35 AM  Result Value Ref Range   Glucose-Capillary 153 (H) 70 - 99 mg/dL    Comment: Glucose reference range applies only to samples taken after fasting for at least 8 hours.  Comprehensive metabolic panel     Status: Abnormal   Collection Time: 01/15/21  7:50 AM  Result Value Ref Range   Sodium 134 (L) 135 - 145 mmol/L   Potassium 3.6 3.5 - 5.1 mmol/L   Chloride 111 98 - 111 mmol/L   CO2 19 (L) 22 - 32 mmol/L   Glucose, Bld 141 (H) 70 - 99 mg/dL    Comment: Glucose reference range applies only to samples taken  after fasting for at least 8 hours.   BUN 10 8 - 23 mg/dL   Creatinine, Ser 1.04 0.61 - 1.24 mg/dL   Calcium 5.6 (LL) 8.9 - 10.3 mg/dL    Comment: CRITICAL RESULT CALLED TO, READ BACK BY AND VERIFIED WITH: ISLEY,T AT 8:25AM ON 01/15/21 BY FESTERMAN,C    Total Protein 5.7 (L) 6.5 - 8.1 g/dL   Albumin 1.6 (L) 3.5 - 5.0 g/dL   AST 15 15 - 41 U/L   ALT 8 0 - 44 U/L   Alkaline Phosphatase 95 38 - 126 U/L   Total Bilirubin 0.6 0.3 - 1.2 mg/dL   GFR, Estimated >60 >60 mL/min    Comment: (NOTE) Calculated using the CKD-EPI Creatinine Equation (2021)    Anion gap 4 (L) 5 - 15    Comment: Performed at [facility], [address], [place], [place] [address]  CBC with Differential/Platelet     Status: Abnormal   Collection Time: 01/15/21  7:50 AM  Result Value Ref Range   WBC 10.7 (H) 4.0 - 10.5 K/uL   RBC 3.55 (L) 4.22 - 5.81 MIL/uL   Hemoglobin 9.9 (L) 13.0 - 17.0 g/dL   HCT 30.5 (L) 39.0 - 52.0 %   MCV 85.9 80.0 - 100.0 fL   MCH 27.9 26.0 - 34.0 pg   MCHC 32.5 30.0 - 36.0 g/dL   RDW 16.7 (H) 11.5 - 15.5 %   Platelets 460 (H) 150 - 400 K/uL   nRBC 0.0 0.0 - 0.2 %   Neutrophils Relative % 72 %   Neutro Abs 7.9 (H) 1.7 - 7.7 K/uL   Lymphocytes Relative 19 %   Lymphs Abs 2.0 0.7 - 4.0 K/uL   Monocytes Relative 6 %   Monocytes Absolute 0.6 0.1 - 1.0 K/uL   Eosinophils Relative 1 %   Eosinophils Absolute 0.1 0.0 - 0.5 K/uL   Basophils Relative 1 %   Basophils Absolute 0.1 0.0 - 0.1 K/uL   Immature Granulocytes 1 %   Abs Immature Granulocytes 0.05 0.00 - 0.07 K/uL    Comment: Performed at [facility], [address], [place], [place] [address]  Magnesium     Status: None   Collection Time: 01/15/21  7:50 AM  Result Value Ref Range   Magnesium 1.7 1.7 - 2.4 mg/dL    Comment: Performed at [facility], [address], [place], [place] [address]  Glucose, capillary     Status: Abnormal   Collection Time: 01/15/21 11:07 AM  Result Value Ref Range   Glucose-Capillary 141 (H) 70 - 99  mg/dL    Comment: Glucose reference range applies only to samples taken after fasting for at least 8 hours.  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  His previsit thyroid function tests are consistent with under-replacement.  His Levothyroxine was increased to 100 mcg since last visit, but it appears that is still not enough.  He is advised to increase his Levothyroxine to 112 mcg po daily before breakfast.     - We discussed about the correct intake of his thyroid hormone, on empty stomach at fasting, with water, separated by at least 30 minutes from breakfast and other  medications,  and separated by more than 4 hours from calcium, iron, multivitamins, acid reflux medications (PPIs). -Patient is made aware of the fact that thyroid hormone replacement is needed for life, dose to be adjusted by periodic monitoring of thyroid function tests.     - he is  advised to maintain close follow up with [name], FNP for primary care needs, as well as his other providers for optimal and coordinated care.      I spent 30 minutes in the care of the patient today including review of labs from [place], Lipids, Thyroid Function, Hematology (current and previous including abstractions from other facilities); face-to-face time discussing  his blood glucose readings/logs, discussing hypoglycemia and hyperglycemia episodes and symptoms, medications doses, his options of short and long term treatment based on the latest standards of care / guidelines;  discussion about incorporating lifestyle medicine;  and documenting the encounter.    Please refer to Patient Instructions for Blood Glucose Monitoring and Insulin/Medications Dosing Guide"  in media tab for  additional information. Please  also refer to " Patient Self Inventory" in the Media  tab for reviewed elements of pertinent patient history.  Todd [name] participated in the discussions, expressed understanding, and voiced agreement with the above plans.  All questions were answered to his satisfaction. he is encouraged to contact clinic should he have any questions or concerns prior to his return visit.   Follow up plan: - Return in about 3 months (around 05/27/2021) for Diabetes F/U with A1c in office, Thyroid follow up, Previsit labs, Bring meter and logs.  Rayetta Pigg, [facility] [address] [place], [place] [address] Phone: [phone] Fax: [phone]  02/25/2021, 10:12 AM

## 2021-03-09 DIAGNOSIS — Z79899 Other long term (current) drug therapy: Secondary | ICD-10-CM | POA: Diagnosis not present

## 2021-03-09 DIAGNOSIS — E782 Mixed hyperlipidemia: Secondary | ICD-10-CM | POA: Diagnosis not present

## 2021-03-09 DIAGNOSIS — E039 Hypothyroidism, unspecified: Secondary | ICD-10-CM | POA: Diagnosis not present

## 2021-03-18 DIAGNOSIS — I10 Essential (primary) hypertension: Secondary | ICD-10-CM | POA: Diagnosis not present

## 2021-03-18 DIAGNOSIS — E785 Hyperlipidemia, unspecified: Secondary | ICD-10-CM | POA: Diagnosis not present

## 2021-03-18 DIAGNOSIS — I4891 Unspecified atrial fibrillation: Secondary | ICD-10-CM | POA: Diagnosis not present

## 2021-03-30 ENCOUNTER — Ambulatory Visit: Payer: Medicare HMO | Admitting: Family Medicine

## 2021-04-01 DIAGNOSIS — E1169 Type 2 diabetes mellitus with other specified complication: Secondary | ICD-10-CM | POA: Diagnosis not present

## 2021-04-01 DIAGNOSIS — F1010 Alcohol abuse, uncomplicated: Secondary | ICD-10-CM | POA: Diagnosis not present

## 2021-04-01 DIAGNOSIS — Z794 Long term (current) use of insulin: Secondary | ICD-10-CM | POA: Diagnosis not present

## 2021-04-01 DIAGNOSIS — I10 Essential (primary) hypertension: Secondary | ICD-10-CM | POA: Diagnosis not present

## 2021-04-01 DIAGNOSIS — Z89611 Acquired absence of right leg above knee: Secondary | ICD-10-CM | POA: Diagnosis not present

## 2021-04-01 DIAGNOSIS — F1410 Cocaine abuse, uncomplicated: Secondary | ICD-10-CM | POA: Diagnosis not present

## 2021-04-01 DIAGNOSIS — F1910 Other psychoactive substance abuse, uncomplicated: Secondary | ICD-10-CM | POA: Diagnosis not present

## 2021-04-01 DIAGNOSIS — I4891 Unspecified atrial fibrillation: Secondary | ICD-10-CM | POA: Diagnosis not present

## 2021-04-09 ENCOUNTER — Ambulatory Visit (HOSPITAL_COMMUNITY): Payer: Medicare HMO | Attending: Internal Medicine

## 2021-04-12 DIAGNOSIS — Z89611 Acquired absence of right leg above knee: Secondary | ICD-10-CM | POA: Diagnosis not present

## 2021-04-12 DIAGNOSIS — I10 Essential (primary) hypertension: Secondary | ICD-10-CM | POA: Diagnosis not present

## 2021-04-12 DIAGNOSIS — E1169 Type 2 diabetes mellitus with other specified complication: Secondary | ICD-10-CM | POA: Diagnosis not present

## 2021-04-12 DIAGNOSIS — F1010 Alcohol abuse, uncomplicated: Secondary | ICD-10-CM | POA: Diagnosis not present

## 2021-04-12 DIAGNOSIS — F1410 Cocaine abuse, uncomplicated: Secondary | ICD-10-CM | POA: Diagnosis not present

## 2021-04-12 DIAGNOSIS — I4891 Unspecified atrial fibrillation: Secondary | ICD-10-CM | POA: Diagnosis not present

## 2021-04-12 DIAGNOSIS — Z89612 Acquired absence of left leg above knee: Secondary | ICD-10-CM | POA: Diagnosis not present

## 2021-04-12 DIAGNOSIS — F1910 Other psychoactive substance abuse, uncomplicated: Secondary | ICD-10-CM | POA: Diagnosis not present

## 2021-04-13 DIAGNOSIS — R5381 Other malaise: Secondary | ICD-10-CM | POA: Diagnosis not present

## 2021-04-14 DIAGNOSIS — F419 Anxiety disorder, unspecified: Secondary | ICD-10-CM | POA: Diagnosis not present

## 2021-04-14 DIAGNOSIS — F14222 Cocaine dependence with intoxication with perceptual disturbance: Secondary | ICD-10-CM | POA: Diagnosis not present

## 2021-04-14 DIAGNOSIS — F17200 Nicotine dependence, unspecified, uncomplicated: Secondary | ICD-10-CM | POA: Diagnosis not present

## 2021-04-14 DIAGNOSIS — F329 Major depressive disorder, single episode, unspecified: Secondary | ICD-10-CM | POA: Diagnosis not present

## 2021-04-14 DIAGNOSIS — F1020 Alcohol dependence, uncomplicated: Secondary | ICD-10-CM | POA: Diagnosis not present

## 2021-04-19 ENCOUNTER — Inpatient Hospital Stay (HOSPITAL_COMMUNITY)
Admission: EM | Admit: 2021-04-19 | Discharge: 2021-05-11 | DRG: 291 | Disposition: A | Discharge status: Skilled Nursing Facility | Payer: Medicare HMO | Attending: Family Medicine | Admitting: Family Medicine

## 2021-04-19 ENCOUNTER — Encounter (HOSPITAL_COMMUNITY): Payer: Self-pay

## 2021-04-19 ENCOUNTER — Emergency Department (HOSPITAL_COMMUNITY): Payer: Medicare HMO

## 2021-04-19 ENCOUNTER — Other Ambulatory Visit: Payer: Self-pay

## 2021-04-19 DIAGNOSIS — E11649 Type 2 diabetes mellitus with hypoglycemia without coma: Secondary | ICD-10-CM | POA: Diagnosis not present

## 2021-04-19 DIAGNOSIS — J441 Chronic obstructive pulmonary disease with (acute) exacerbation: Secondary | ICD-10-CM | POA: Diagnosis present

## 2021-04-19 DIAGNOSIS — K219 Gastro-esophageal reflux disease without esophagitis: Secondary | ICD-10-CM

## 2021-04-19 DIAGNOSIS — R778 Other specified abnormalities of plasma proteins: Secondary | ICD-10-CM | POA: Diagnosis not present

## 2021-04-19 DIAGNOSIS — Z89612 Acquired absence of left leg above knee: Secondary | ICD-10-CM

## 2021-04-19 DIAGNOSIS — J9690 Respiratory failure, unspecified, unspecified whether with hypoxia or hypercapnia: Secondary | ICD-10-CM | POA: Diagnosis not present

## 2021-04-19 DIAGNOSIS — E7219 Other disorders of sulfur-bearing amino-acid metabolism: Secondary | ICD-10-CM | POA: Diagnosis not present

## 2021-04-19 DIAGNOSIS — E872 Acidosis, unspecified: Secondary | ICD-10-CM | POA: Diagnosis present

## 2021-04-19 DIAGNOSIS — N1831 Chronic kidney disease, stage 3a: Secondary | ICD-10-CM | POA: Diagnosis present

## 2021-04-19 DIAGNOSIS — Z20822 Contact with and (suspected) exposure to covid-19: Secondary | ICD-10-CM | POA: Diagnosis present

## 2021-04-19 DIAGNOSIS — I5032 Chronic diastolic (congestive) heart failure: Secondary | ICD-10-CM | POA: Diagnosis not present

## 2021-04-19 DIAGNOSIS — I5043 Acute on chronic combined systolic (congestive) and diastolic (congestive) heart failure: Secondary | ICD-10-CM | POA: Diagnosis not present

## 2021-04-19 DIAGNOSIS — E1169 Type 2 diabetes mellitus with other specified complication: Secondary | ICD-10-CM | POA: Diagnosis not present

## 2021-04-19 DIAGNOSIS — I482 Chronic atrial fibrillation, unspecified: Secondary | ICD-10-CM | POA: Diagnosis present

## 2021-04-19 DIAGNOSIS — F1410 Cocaine abuse, uncomplicated: Secondary | ICD-10-CM | POA: Diagnosis present

## 2021-04-19 DIAGNOSIS — R069 Unspecified abnormalities of breathing: Secondary | ICD-10-CM | POA: Diagnosis not present

## 2021-04-19 DIAGNOSIS — Z9114 Patient's other noncompliance with medication regimen: Secondary | ICD-10-CM

## 2021-04-19 DIAGNOSIS — E785 Hyperlipidemia, unspecified: Secondary | ICD-10-CM | POA: Diagnosis not present

## 2021-04-19 DIAGNOSIS — F1910 Other psychoactive substance abuse, uncomplicated: Secondary | ICD-10-CM

## 2021-04-19 DIAGNOSIS — I4729 Other ventricular tachycardia: Secondary | ICD-10-CM | POA: Diagnosis not present

## 2021-04-19 DIAGNOSIS — Z8249 Family history of ischemic heart disease and other diseases of the circulatory system: Secondary | ICD-10-CM

## 2021-04-19 DIAGNOSIS — Z7401 Bed confinement status: Secondary | ICD-10-CM | POA: Diagnosis not present

## 2021-04-19 DIAGNOSIS — I5021 Acute systolic (congestive) heart failure: Secondary | ICD-10-CM | POA: Diagnosis not present

## 2021-04-19 DIAGNOSIS — D75838 Other thrombocytosis: Secondary | ICD-10-CM | POA: Diagnosis present

## 2021-04-19 DIAGNOSIS — D631 Anemia in chronic kidney disease: Secondary | ICD-10-CM | POA: Diagnosis present

## 2021-04-19 DIAGNOSIS — R918 Other nonspecific abnormal finding of lung field: Secondary | ICD-10-CM | POA: Diagnosis not present

## 2021-04-19 DIAGNOSIS — J189 Pneumonia, unspecified organism: Secondary | ICD-10-CM | POA: Diagnosis not present

## 2021-04-19 DIAGNOSIS — Z794 Long term (current) use of insulin: Secondary | ICD-10-CM | POA: Diagnosis not present

## 2021-04-19 DIAGNOSIS — E440 Moderate protein-calorie malnutrition: Secondary | ICD-10-CM | POA: Diagnosis present

## 2021-04-19 DIAGNOSIS — Z79890 Hormone replacement therapy (postmenopausal): Secondary | ICD-10-CM

## 2021-04-19 DIAGNOSIS — J440 Chronic obstructive pulmonary disease with acute lower respiratory infection: Secondary | ICD-10-CM | POA: Diagnosis present

## 2021-04-19 DIAGNOSIS — T380X5A Adverse effect of glucocorticoids and synthetic analogues, initial encounter: Secondary | ICD-10-CM | POA: Diagnosis present

## 2021-04-19 DIAGNOSIS — Z79899 Other long term (current) drug therapy: Secondary | ICD-10-CM

## 2021-04-19 DIAGNOSIS — D649 Anemia, unspecified: Secondary | ICD-10-CM | POA: Diagnosis not present

## 2021-04-19 DIAGNOSIS — I5042 Chronic combined systolic (congestive) and diastolic (congestive) heart failure: Secondary | ICD-10-CM

## 2021-04-19 DIAGNOSIS — Z89511 Acquired absence of right leg below knee: Secondary | ICD-10-CM

## 2021-04-19 DIAGNOSIS — I959 Hypotension, unspecified: Secondary | ICD-10-CM | POA: Diagnosis not present

## 2021-04-19 DIAGNOSIS — F1010 Alcohol abuse, uncomplicated: Secondary | ICD-10-CM | POA: Diagnosis present

## 2021-04-19 DIAGNOSIS — E1122 Type 2 diabetes mellitus with diabetic chronic kidney disease: Secondary | ICD-10-CM | POA: Diagnosis present

## 2021-04-19 DIAGNOSIS — J9601 Acute respiratory failure with hypoxia: Secondary | ICD-10-CM | POA: Diagnosis not present

## 2021-04-19 DIAGNOSIS — R0902 Hypoxemia: Secondary | ICD-10-CM | POA: Diagnosis not present

## 2021-04-19 DIAGNOSIS — I248 Other forms of acute ischemic heart disease: Secondary | ICD-10-CM | POA: Diagnosis not present

## 2021-04-19 DIAGNOSIS — I480 Paroxysmal atrial fibrillation: Secondary | ICD-10-CM | POA: Diagnosis not present

## 2021-04-19 DIAGNOSIS — E1151 Type 2 diabetes mellitus with diabetic peripheral angiopathy without gangrene: Secondary | ICD-10-CM | POA: Diagnosis present

## 2021-04-19 DIAGNOSIS — Z89611 Acquired absence of right leg above knee: Secondary | ICD-10-CM | POA: Diagnosis not present

## 2021-04-19 DIAGNOSIS — I4891 Unspecified atrial fibrillation: Secondary | ICD-10-CM | POA: Diagnosis not present

## 2021-04-19 DIAGNOSIS — E1165 Type 2 diabetes mellitus with hyperglycemia: Secondary | ICD-10-CM | POA: Diagnosis present

## 2021-04-19 DIAGNOSIS — Z681 Body mass index (BMI) 19 or less, adult: Secondary | ICD-10-CM

## 2021-04-19 DIAGNOSIS — I472 Ventricular tachycardia, unspecified: Secondary | ICD-10-CM | POA: Diagnosis not present

## 2021-04-19 DIAGNOSIS — I2510 Atherosclerotic heart disease of native coronary artery without angina pectoris: Secondary | ICD-10-CM | POA: Diagnosis present

## 2021-04-19 DIAGNOSIS — E876 Hypokalemia: Secondary | ICD-10-CM | POA: Diagnosis not present

## 2021-04-19 DIAGNOSIS — E46 Unspecified protein-calorie malnutrition: Secondary | ICD-10-CM

## 2021-04-19 DIAGNOSIS — D72829 Elevated white blood cell count, unspecified: Secondary | ICD-10-CM | POA: Diagnosis present

## 2021-04-19 DIAGNOSIS — I255 Ischemic cardiomyopathy: Secondary | ICD-10-CM | POA: Diagnosis present

## 2021-04-19 DIAGNOSIS — E8809 Other disorders of plasma-protein metabolism, not elsewhere classified: Secondary | ICD-10-CM | POA: Diagnosis present

## 2021-04-19 DIAGNOSIS — F32A Depression, unspecified: Secondary | ICD-10-CM | POA: Diagnosis present

## 2021-04-19 DIAGNOSIS — N179 Acute kidney failure, unspecified: Secondary | ICD-10-CM | POA: Diagnosis not present

## 2021-04-19 DIAGNOSIS — R7989 Other specified abnormal findings of blood chemistry: Secondary | ICD-10-CM | POA: Diagnosis not present

## 2021-04-19 DIAGNOSIS — R000 Tachycardia, unspecified: Secondary | ICD-10-CM | POA: Diagnosis not present

## 2021-04-19 DIAGNOSIS — I471 Supraventricular tachycardia: Secondary | ICD-10-CM | POA: Diagnosis not present

## 2021-04-19 DIAGNOSIS — I10 Essential (primary) hypertension: Secondary | ICD-10-CM | POA: Diagnosis not present

## 2021-04-19 DIAGNOSIS — R0602 Shortness of breath: Secondary | ICD-10-CM | POA: Diagnosis not present

## 2021-04-19 DIAGNOSIS — Z8679 Personal history of other diseases of the circulatory system: Secondary | ICD-10-CM | POA: Diagnosis not present

## 2021-04-19 DIAGNOSIS — F17210 Nicotine dependence, cigarettes, uncomplicated: Secondary | ICD-10-CM | POA: Diagnosis present

## 2021-04-19 DIAGNOSIS — Z833 Family history of diabetes mellitus: Secondary | ICD-10-CM

## 2021-04-19 DIAGNOSIS — I5031 Acute diastolic (congestive) heart failure: Secondary | ICD-10-CM | POA: Diagnosis not present

## 2021-04-19 DIAGNOSIS — D75839 Thrombocytosis, unspecified: Secondary | ICD-10-CM | POA: Diagnosis present

## 2021-04-19 DIAGNOSIS — M109 Gout, unspecified: Secondary | ICD-10-CM | POA: Diagnosis present

## 2021-04-19 DIAGNOSIS — Z7901 Long term (current) use of anticoagulants: Secondary | ICD-10-CM

## 2021-04-19 DIAGNOSIS — I130 Hypertensive heart and chronic kidney disease with heart failure and stage 1 through stage 4 chronic kidney disease, or unspecified chronic kidney disease: Principal | ICD-10-CM | POA: Diagnosis present

## 2021-04-19 DIAGNOSIS — Z91199 Patient's noncompliance with other medical treatment and regimen due to unspecified reason: Secondary | ICD-10-CM

## 2021-04-19 DIAGNOSIS — T383X5A Adverse effect of insulin and oral hypoglycemic [antidiabetic] drugs, initial encounter: Secondary | ICD-10-CM | POA: Diagnosis not present

## 2021-04-19 DIAGNOSIS — E039 Hypothyroidism, unspecified: Secondary | ICD-10-CM | POA: Diagnosis present

## 2021-04-19 DIAGNOSIS — N183 Chronic kidney disease, stage 3 unspecified: Secondary | ICD-10-CM | POA: Diagnosis not present

## 2021-04-19 DIAGNOSIS — E160 Drug-induced hypoglycemia without coma: Secondary | ICD-10-CM | POA: Diagnosis not present

## 2021-04-19 DIAGNOSIS — J90 Pleural effusion, not elsewhere classified: Secondary | ICD-10-CM | POA: Diagnosis not present

## 2021-04-19 DIAGNOSIS — Z888 Allergy status to other drugs, medicaments and biological substances status: Secondary | ICD-10-CM

## 2021-04-19 NOTE — ED Triage Notes (Addendum)
[facility] from [facility] of SOB. Facility gave a neb before ems arrival. Facility states room air was in the 80s. Ems had him on 4l [place].  98% here on room air.

## 2021-04-19 NOTE — ED Provider Notes (Signed)
[facility] EMERGENCY DEPARTMENT Provider Note   CSN: [id number] Arrival date & time: 04/19/21  2332     History Chief Complaint  Patient presents with   Shortness of Breath    Todd [name] is a 65 y.o. male.  Patient sent to the emergency department from skilled nursing facility for shortness of breath.  Patient reports that the shortness of breath began tonight.  Patient does not normally use oxygen.  Facility placed him on 2 L by nasal cannula and gave him a breathing treatment.  He reports that he felt a little better.  EMS increased his oxygen to 4 L during transport.  He is noted to have some increased shortness of breath when lying flat.  He is not experiencing any chest pain.  He denies fever and cough.  He reports that symptoms began somewhat suddenly tonight.      Past Medical History:  Diagnosis Date   AKI (acute kidney injury) ([place])    Constipated    Diabetes mellitus without complication ([place])    Diarrhea    Elevated LFTs    Goiter    Gout    Hyperlipidemia    Hypertension    Leukocytosis    Reactive thrombocytosis    Right BKA infection ([place]) 08/2016   Right leg pain    Sepsis due to undetermined organism [facility])    Thyroid disease    Wound infection after surgery 08/2016    Patient Active Problem List   Diagnosis Date Noted   Candida esophagitis ([place])    Iron deficiency anemia due to chronic blood loss 01/11/2021   Blood in stool    Atrial fibrillation with RVR ([place]) 01/10/2021   Hypomagnesemia 01/10/2021   Severe Hypophosphatemia 12/28/2020   Non-Sustained V-tach in the setting of Severe Electrolyte Derangement 12/28/2020   Cocaine abuse -on-going 12/28/2020   Alcohol abuse, continuous 12/28/2020   Syncope and collapse-suspect arrhythmia related in the setting of severe electrolyte abnormalities 12/28/2020   Diabetes mellitus type 2 with complications ([place]) 15/40/0867   Tobacco abuse 12/28/2020   Atrial fibrillation ([place]) 12/26/2020    Hypocalcemia 61/95/0932   Acute metabolic encephalopathy 67/05/4579   Hypoglycemia 12/05/2020   AKI (acute kidney injury) ([place]) 12/04/2020   Vitamin D deficiency 06/17/2019   Essential hypertension, benign 08/30/2018   Mixed hyperlipidemia 07/17/2018   Hyponatremia 02/14/2018   Hypokalemia 02/14/2018   Medically noncompliant 02/14/2018   S/P AKA (above knee amputation) bilateral ([place]) 02/12/2018   Current smoker 02/03/2018   Leukocytosis 02/03/2018   Reactive thrombocytosis 02/03/2018   Urinary incontinence 08/14/2017   Transaminitis    Hyperuricemia 06/21/2016   HTN (hypertension) 01/05/2016   Diabetes type 2, uncontrolled 01/05/2016   Hypothyroidism 05/10/2011    Past Surgical History:  Procedure Laterality Date   ABDOMINAL AORTOGRAM N/A 08/11/2016   Procedure: Abdominal Aortogram;  Surgeon: Waynetta Sandy, MD;  Location: [place] [facility];  Service: Cardiovascular;  Laterality: N/A;   ABDOMINAL AORTOGRAM W/LOWER EXTREMITY N/A 08/15/2016   Procedure: Abdominal Aortogram w/Lower Extremity;  Surgeon: Elam Dutch, MD;  Location: [place] [facility];  Service: Cardiovascular;  Laterality: N/A;   AMPUTATION Right 08/17/2016   Procedure: RIGHT BELOW KNEE AMPUTATION;  Surgeon: Elam Dutch, MD;  Location: [place];  Service: Vascular;  Laterality: Right;   AMPUTATION Right 09/12/2016   Procedure: AMPUTATION ABOVE KNEE;  Surgeon: Newt Minion, MD;  Location: [place];  Service: Orthopedics;  Laterality: Right;   AMPUTATION Left 08/12/2016   Procedure: LEFT  BELOW KNEE AMPUTATION;  Surgeon: Newt Minion, MD;  Location: [place];  Service: Orthopedics;  Laterality: Left;   AMPUTATION Left 11/01/2017   Procedure: LEFT ABOVE KNEE AMPUTATION;  Surgeon: Newt Minion, MD;  Location: [place];  Service: Orthopedics;  Laterality: Left;   APPLICATION OF WOUND VAC Right 09/12/2016   Procedure: APPLICATION OF WOUND VAC ABOVE KNEE;  Surgeon: Newt Minion, MD;  Location: [place];  Service: Orthopedics;   Laterality: Right;   BIOPSY  01/13/2021   Procedure: BIOPSY;  Surgeon: Rogene Houston, MD;  [facility];  Service: Endoscopy;;  esophageal   COLONOSCOPY WITH PROPOFOL N/A 01/13/2021   Procedure: COLONOSCOPY WITH PROPOFOL;  Surgeon: Rogene Houston, MD;  [facility];  Service: Endoscopy;  Laterality: N/A;   ESOPHAGOGASTRODUODENOSCOPY (EGD) WITH PROPOFOL N/A 01/13/2021   Procedure: ESOPHAGOGASTRODUODENOSCOPY (EGD) WITH PROPOFOL;  Surgeon: Rogene [place], MD;  [facility];  Service: Endoscopy;  Laterality: N/A;   LOWER EXTREMITY ANGIOGRAPHY Bilateral 08/11/2016   Procedure: Lower Extremity Angiography;  Surgeon: Waynetta Sandy, MD;  Location: [place] [employer] [facility];  Service: Cardiovascular;  Laterality: Bilateral;   PERIPHERAL VASCULAR BALLOON ANGIOPLASTY Left 08/11/2016   Procedure: Peripheral Vascular Balloon Angioplasty;  Surgeon: Waynetta Sandy, MD;  Location: [place] [facility];  Service: Cardiovascular;  Laterality: Left;  SFA   POLYPECTOMY  01/13/2021   Procedure: POLYPECTOMY;  Surgeon: Rogene Houston, MD;  [facility];  Service: Endoscopy;;   THYROID SURGERY         Family History  Problem Relation Age of Onset   Heart disease Mother    Pneumonia Father    Diabetes Maternal Aunt    Diabetes Maternal Uncle     Social History   Tobacco Use   Smoking status: Every Day    Packs/day: 0.25    Years: 45.00    Pack years: 11.25    Types: Cigarettes    Last attempt to quit: 11/12/2014    Years since quitting: 6.4   Smokeless tobacco: Never  Vaping Use   Vaping Use: Never used  Substance Use Topics   Alcohol use: Yes    Alcohol/week: 2.0 standard drinks    Types: 1 Glasses of wine, 1 Cans of beer per week   Drug use: No    Home Medications Prior to Admission medications   Medication Sig Start Date End Date Taking? Authorizing Provider  Accu-Chek Softclix Lancets lancets  12/25/20   [provider]   acetaminophen (TYLENOL) 325 MG tablet Take 2 tablets (650 mg total) by mouth every 6 (six) hours as needed for mild pain (or Fever >/= 101). 01/15/21   Johnson, Clanford L, MD  allopurinol (ZYLOPRIM) 100 MG tablet Take 1 tablet (100 mg total) by mouth daily. Patient taking differently: Take 300 mg by mouth daily. 12/06/20   Johnson, Clanford L, MD  amLODipine (NORVASC) 5 MG tablet Take 1 tablet (5 mg total) by mouth daily. 01/15/21 01/15/22  Johnson, Clanford L, MD  apixaban (ELIQUIS) 5 MG TABS tablet Take 1 tablet (5 mg total) by mouth 2 (two) times daily. 01/16/21   Johnson, Clanford L, MD  atorvastatin (LIPITOR) 40 MG tablet TAKE 1 TABLET BY MOUTH ONCE DAILY AT 6 PM 12/06/20   Johnson, Clanford L, MD  blood glucose meter kit and supplies Dispense based on patient and insurance preference. Use up to four times daily as directed. (FOR ICD-10 E10.9, E11.9). 12/06/20   Wynetta Emery, Clanford  L, MD  calcium carbonate (TUMS - DOSED IN MG ELEMENTAL CALCIUM) 500 MG chewable tablet Chew 2 tablets (400 mg of elemental calcium total) by mouth 2 (two) times daily with a meal. 01/15/21   Johnson, Clanford L, MD  cetirizine (ZYRTEC) 10 MG tablet Take 1 tablet (10 mg total) by mouth daily as needed for allergies. For allergy symptoms 12/06/20   Murlean Iba, MD  cholecalciferol (VITAMIN D3) 25 MCG (1000 UNIT) tablet Take 1,000 Units by mouth daily.    [provider]  folic acid (FOLVITE) 1 MG tablet Take 1 tablet (1 mg total) by mouth daily. 12/29/20   Roxan Hockey, MD  glucose blood (ACCU-CHEK AVIVA PLUS) test strip Check blood sugars four times a day 03/12/18   [name], MD  levothyroxine (SYNTHROID) 112 MCG tablet Take 1 tablet (112 mcg total) by mouth daily before breakfast. 02/25/21 02/25/22  Brita Romp, NP  megestrol (MEGACE) 400 MG/10ML suspension Take 10 mLs (400 mg total) by mouth 2 (two) times daily. For appetite stimulation 12/06/20   Johnson, Clanford L, MD  metoprolol succinate (TOPROL  XL) 25 MG 24 hr tablet Take 1 tablet (25 mg total) by mouth daily. 12/29/20 12/29/21  Roxan Hockey, MD  Multiple Vitamin (MULTIVITAMIN WITH MINERALS) TABS tablet Take 1 tablet by mouth daily. 01/16/21   Johnson, Clanford L, MD  Nutritional Supplements (NUTRITIONAL SHAKE HIGH PROTEIN) LIQD Take by mouth.    [provider]  Nystatin (GERHARDT'S BUTT CREAM) CREA Apply 1 application topically 3 (three) times daily. 01/15/21   Johnson, Clanford L, MD  pantoprazole (PROTONIX) 40 MG tablet Take 1 tablet (40 mg total) by mouth 2 (two) times daily. 01/15/21   Johnson, Clanford L, MD  polyethylene glycol (MIRALAX / GLYCOLAX) 17 g packet Take 17 g by mouth daily. 01/16/21   Johnson, Clanford L, MD  sertraline (ZOLOFT) 50 MG tablet Take 1 tablet (50 mg total) by mouth at bedtime. 12/06/20   Johnson, Clanford L, MD  silver sulfADIAZINE (SILVADENE) 1 % cream Apply 1 application topically daily. Apply to buttock    [provider]  thiamine 100 MG tablet Take 1 tablet (100 mg total) by mouth daily. 12/29/20   Roxan Hockey, MD    Allergies    Lisinopril  Review of Systems   Review of Systems  Respiratory:  Positive for shortness of breath.   All other systems reviewed and are negative.  Physical Exam Updated Vital Signs BP (!) 149/99   Pulse 94   Temp 98 F (36.7 C) (Oral)   Resp 17   Ht 5' 8"  (1.727 m)   Wt 59 kg   SpO2 98%   BMI 19.78 kg/m   Physical Exam Vitals and nursing note reviewed.  Constitutional:      General: He is not in acute distress.    Appearance: Normal appearance. He is well-developed.  HENT:     Head: Normocephalic and atraumatic.     Right Ear: Hearing normal.     Left Ear: Hearing normal.     Nose: Nose normal.  Eyes:     Conjunctiva/sclera: Conjunctivae normal.     Pupils: Pupils are equal, round, and reactive to light.  Cardiovascular:     Rate and Rhythm: Regular rhythm.     Heart sounds: S1 normal and S2 normal. No murmur heard.   No friction  rub. No gallop.  Pulmonary:     Effort: Pulmonary effort is normal. Tachypnea present. No respiratory distress.  Breath sounds: Examination of the right-lower field reveals rales. Examination of the left-lower field reveals rales. Rales present.  Chest:     Chest wall: No tenderness.  Abdominal:     General: Bowel sounds are normal.     Palpations: Abdomen is soft.     Tenderness: There is no abdominal tenderness. There is no guarding or rebound. Negative signs include Murphy's sign and McBurney's sign.     Hernia: No hernia is present.  Musculoskeletal:        General: Normal range of motion.     Cervical back: Normal range of motion and neck supple.  Skin:    General: Skin is warm and dry.     Findings: No rash.  Neurological:     Mental Status: He is alert and oriented to person, place, and time.     GCS: GCS eye subscore is 4. GCS verbal subscore is 5. GCS motor subscore is 6.     Cranial Nerves: No cranial nerve deficit.     Sensory: No sensory deficit.     Coordination: Coordination normal.  Psychiatric:        Speech: Speech normal.        Behavior: Behavior normal.        Thought Content: Thought content normal.    ED Results / Procedures / Treatments   Labs (all labs ordered are listed, but only abnormal results are displayed) Labs Reviewed  CBC WITH DIFFERENTIAL/PLATELET - Abnormal; Notable for the following components:      Result Value   WBC 17.5 (*)    RBC 3.94 (*)    Hemoglobin 11.3 (*)    HCT 35.3 (*)    RDW 16.7 (*)    Platelets 725 (*)    Neutro Abs 13.6 (*)    Abs Immature Granulocytes 0.24 (*)    All other components within normal limits  COMPREHENSIVE METABOLIC PANEL - Abnormal; Notable for the following components:   Chloride 115 (*)    CO2 18 (*)    Glucose, Bld 174 (*)    BUN 26 (*)    Creatinine, Ser 1.89 (*)    Calcium 7.9 (*)    Albumin 2.6 (*)    Alkaline Phosphatase 136 (*)    GFR, Estimated 39 (*)    All other components within  normal limits  TROPONIN I (HIGH SENSITIVITY) - Abnormal; Notable for the following components:   Troponin I (High Sensitivity) 52 (*)    All other components within normal limits  RESP PANEL BY RT-PCR (FLU A&B, COVID) ARPGX2  CULTURE, BLOOD (ROUTINE X 2)  CULTURE, BLOOD (ROUTINE X 2)  LACTIC ACID, PLASMA  BRAIN NATRIURETIC PEPTIDE  TROPONIN I (HIGH SENSITIVITY)    EKG EKG Interpretation  Date/Time:  Monday April 19 2021 23:40:18 EST Ventricular Rate:  100 PR Interval:  47 QRS Duration: 85 QT Interval:  368 QTC Calculation: 475 R Axis:   3 Text Interpretation: Sinus tachycardia Ventricular premature complex Anteroseptal infarct, old Nonspecific T abnormalities, lateral leads Confirmed by Orpah Greek [phone]) on 04/20/2021 1:10:59 AM  Radiology DG Chest Port 1 View  Result Date: 04/20/2021 CLINICAL DATA:  Shortness of breath EXAM: PORTABLE CHEST 1 VIEW COMPARISON:  01/10/2021, 12/04/2020 FINDINGS: Normal cardiac size. Mild diffuse interstitial thickening with mild ground-glass opacity in the right thorax. No pleural effusion or pneumothorax. Aortic atherosclerosis. Postsurgical changes at the thoracic inlet. IMPRESSION: 1. Mild ground-glass opacity in the right thorax, potentially due to pneumonia. 2. Mild background chronic  bronchitic change Electronically Signed   By: Donavan Foil M.D.   On: 04/20/2021 00:05    Procedures Procedures   Medications Ordered in ED Medications  cefTRIAXone (ROCEPHIN) 1 g in sodium chloride 0.9 % 100 mL IVPB (has no administration in time range)  azithromycin (ZITHROMAX) 500 mg in sodium chloride 0.9 % 250 mL IVPB (has no administration in time range)    ED Course  I have reviewed the triage vital signs and the nursing notes.  Pertinent labs & imaging results that were available during my care of the patient were reviewed by me and considered in my medical decision making (see chart for details).    MDM Rules/Calculators/A&P                            Patient presents to the emergency department for evaluation of shortness of breath.  Symptoms began earlier tonight.  Patient has a new oxygen demand.  Vital signs were normal at arrival otherwise.  No tachycardia, tachypnea or fever.  Chest x-ray, however, suspicious for pneumonia.  He does have a leukocytosis.  Lactic acid is normal.  Does not appear septic at this time but with new oxygen demand will require hospitalization.  Treated with Rocephin and Zithromax.  Troponin is elevated, likely leak from hypoxia and pneumonia.  Will need to trend.  No evidence of any EKG changes at arrival.  CRITICAL CARE Performed by: Orpah Greek   Total critical care time: 33 minutes  Critical care time was exclusive of separately billable procedures and treating other patients.  Critical care was necessary to treat or prevent imminent or life-threatening deterioration.  Critical care was time spent personally by me on the following activities: development of treatment plan with patient and/or surrogate as well as nursing, discussions with consultants, evaluation of patient's response to treatment, examination of patient, obtaining history from patient or surrogate, ordering and performing treatments and interventions, ordering and review of laboratory studies, ordering and review of radiographic studies, pulse oximetry and re-evaluation of patient's condition.  Final Clinical Impression(s) / ED Diagnoses Final diagnoses:  Community acquired pneumonia of right lung, unspecified part of lung    Rx / DC Orders ED Discharge Orders     None        Mellany Dinsmore, Gwenyth Allegra, MD 04/20/21 0126

## 2021-04-20 ENCOUNTER — Observation Stay (HOSPITAL_BASED_OUTPATIENT_CLINIC_OR_DEPARTMENT_OTHER): Payer: Medicare HMO

## 2021-04-20 ENCOUNTER — Inpatient Hospital Stay (HOSPITAL_COMMUNITY): Payer: Medicare HMO

## 2021-04-20 DIAGNOSIS — I480 Paroxysmal atrial fibrillation: Secondary | ICD-10-CM | POA: Diagnosis not present

## 2021-04-20 DIAGNOSIS — D631 Anemia in chronic kidney disease: Secondary | ICD-10-CM | POA: Diagnosis present

## 2021-04-20 DIAGNOSIS — J441 Chronic obstructive pulmonary disease with (acute) exacerbation: Secondary | ICD-10-CM | POA: Diagnosis present

## 2021-04-20 DIAGNOSIS — F1910 Other psychoactive substance abuse, uncomplicated: Secondary | ICD-10-CM | POA: Diagnosis not present

## 2021-04-20 DIAGNOSIS — K219 Gastro-esophageal reflux disease without esophagitis: Secondary | ICD-10-CM

## 2021-04-20 DIAGNOSIS — I959 Hypotension, unspecified: Secondary | ICD-10-CM | POA: Diagnosis present

## 2021-04-20 DIAGNOSIS — Z89612 Acquired absence of left leg above knee: Secondary | ICD-10-CM | POA: Diagnosis not present

## 2021-04-20 DIAGNOSIS — E1151 Type 2 diabetes mellitus with diabetic peripheral angiopathy without gangrene: Secondary | ICD-10-CM | POA: Diagnosis present

## 2021-04-20 DIAGNOSIS — Z8679 Personal history of other diseases of the circulatory system: Secondary | ICD-10-CM | POA: Diagnosis not present

## 2021-04-20 DIAGNOSIS — I472 Ventricular tachycardia, unspecified: Secondary | ICD-10-CM | POA: Diagnosis not present

## 2021-04-20 DIAGNOSIS — D72829 Elevated white blood cell count, unspecified: Secondary | ICD-10-CM

## 2021-04-20 DIAGNOSIS — I130 Hypertensive heart and chronic kidney disease with heart failure and stage 1 through stage 4 chronic kidney disease, or unspecified chronic kidney disease: Secondary | ICD-10-CM | POA: Diagnosis present

## 2021-04-20 DIAGNOSIS — R0602 Shortness of breath: Secondary | ICD-10-CM | POA: Diagnosis not present

## 2021-04-20 DIAGNOSIS — I482 Chronic atrial fibrillation, unspecified: Secondary | ICD-10-CM | POA: Diagnosis present

## 2021-04-20 DIAGNOSIS — Z20822 Contact with and (suspected) exposure to covid-19: Secondary | ICD-10-CM | POA: Diagnosis present

## 2021-04-20 DIAGNOSIS — E1165 Type 2 diabetes mellitus with hyperglycemia: Secondary | ICD-10-CM

## 2021-04-20 DIAGNOSIS — F32A Depression, unspecified: Secondary | ICD-10-CM | POA: Diagnosis present

## 2021-04-20 DIAGNOSIS — I5032 Chronic diastolic (congestive) heart failure: Secondary | ICD-10-CM | POA: Diagnosis not present

## 2021-04-20 DIAGNOSIS — F1010 Alcohol abuse, uncomplicated: Secondary | ICD-10-CM | POA: Diagnosis present

## 2021-04-20 DIAGNOSIS — R7989 Other specified abnormal findings of blood chemistry: Secondary | ICD-10-CM

## 2021-04-20 DIAGNOSIS — I5021 Acute systolic (congestive) heart failure: Secondary | ICD-10-CM | POA: Diagnosis not present

## 2021-04-20 DIAGNOSIS — E11649 Type 2 diabetes mellitus with hypoglycemia without coma: Secondary | ICD-10-CM | POA: Diagnosis not present

## 2021-04-20 DIAGNOSIS — I5042 Chronic combined systolic (congestive) and diastolic (congestive) heart failure: Secondary | ICD-10-CM

## 2021-04-20 DIAGNOSIS — I5043 Acute on chronic combined systolic (congestive) and diastolic (congestive) heart failure: Secondary | ICD-10-CM | POA: Diagnosis present

## 2021-04-20 DIAGNOSIS — J189 Pneumonia, unspecified organism: Secondary | ICD-10-CM | POA: Diagnosis present

## 2021-04-20 DIAGNOSIS — E1122 Type 2 diabetes mellitus with diabetic chronic kidney disease: Secondary | ICD-10-CM | POA: Diagnosis present

## 2021-04-20 DIAGNOSIS — E7219 Other disorders of sulfur-bearing amino-acid metabolism: Secondary | ICD-10-CM | POA: Diagnosis not present

## 2021-04-20 DIAGNOSIS — E039 Hypothyroidism, unspecified: Secondary | ICD-10-CM | POA: Diagnosis present

## 2021-04-20 DIAGNOSIS — E872 Acidosis, unspecified: Secondary | ICD-10-CM | POA: Diagnosis present

## 2021-04-20 DIAGNOSIS — I10 Essential (primary) hypertension: Secondary | ICD-10-CM | POA: Diagnosis not present

## 2021-04-20 DIAGNOSIS — F1410 Cocaine abuse, uncomplicated: Secondary | ICD-10-CM | POA: Diagnosis present

## 2021-04-20 DIAGNOSIS — N179 Acute kidney failure, unspecified: Secondary | ICD-10-CM

## 2021-04-20 DIAGNOSIS — D75838 Other thrombocytosis: Secondary | ICD-10-CM

## 2021-04-20 DIAGNOSIS — T383X5A Adverse effect of insulin and oral hypoglycemic [antidiabetic] drugs, initial encounter: Secondary | ICD-10-CM | POA: Diagnosis not present

## 2021-04-20 DIAGNOSIS — E8809 Other disorders of plasma-protein metabolism, not elsewhere classified: Secondary | ICD-10-CM

## 2021-04-20 DIAGNOSIS — E440 Moderate protein-calorie malnutrition: Secondary | ICD-10-CM | POA: Diagnosis present

## 2021-04-20 DIAGNOSIS — I5031 Acute diastolic (congestive) heart failure: Secondary | ICD-10-CM

## 2021-04-20 DIAGNOSIS — N1831 Chronic kidney disease, stage 3a: Secondary | ICD-10-CM | POA: Diagnosis present

## 2021-04-20 DIAGNOSIS — J440 Chronic obstructive pulmonary disease with acute lower respiratory infection: Secondary | ICD-10-CM | POA: Diagnosis present

## 2021-04-20 DIAGNOSIS — I248 Other forms of acute ischemic heart disease: Secondary | ICD-10-CM | POA: Diagnosis not present

## 2021-04-20 DIAGNOSIS — J9601 Acute respiratory failure with hypoxia: Secondary | ICD-10-CM

## 2021-04-20 DIAGNOSIS — R778 Other specified abnormalities of plasma proteins: Secondary | ICD-10-CM | POA: Diagnosis not present

## 2021-04-20 DIAGNOSIS — E160 Drug-induced hypoglycemia without coma: Secondary | ICD-10-CM | POA: Diagnosis not present

## 2021-04-20 DIAGNOSIS — Z89611 Acquired absence of right leg above knee: Secondary | ICD-10-CM

## 2021-04-20 DIAGNOSIS — Z9114 Patient's other noncompliance with medication regimen: Secondary | ICD-10-CM

## 2021-04-20 DIAGNOSIS — I4729 Other ventricular tachycardia: Secondary | ICD-10-CM | POA: Diagnosis not present

## 2021-04-20 DIAGNOSIS — E46 Unspecified protein-calorie malnutrition: Secondary | ICD-10-CM

## 2021-04-20 LAB — COMPREHENSIVE METABOLIC PANEL
ALT: 21 U/L (ref 0–44)
ALT: 18 U/L (ref 0–44)
AST: 19 U/L (ref 15–41)
AST: 17 U/L (ref 15–41)
Albumin: 2.6 g/dL — ABNORMAL LOW (ref 3.5–5.0)
Albumin: 2.4 g/dL — ABNORMAL LOW (ref 3.5–5.0)
Alkaline Phosphatase: 136 U/L — ABNORMAL HIGH (ref 38–126)
Alkaline Phosphatase: 100 U/L (ref 38–126)
Anion gap: 8 (ref 5–15)
Anion gap: 8 (ref 5–15)
BUN: 26 mg/dL — ABNORMAL HIGH (ref 8–23)
BUN: 26 mg/dL — ABNORMAL HIGH (ref 8–23)
CO2: 18 mmol/L — ABNORMAL LOW (ref 22–32)
CO2: 16 mmol/L — ABNORMAL LOW (ref 22–32)
Calcium: 7.9 mg/dL — ABNORMAL LOW (ref 8.9–10.3)
Calcium: 7.9 mg/dL — ABNORMAL LOW (ref 8.9–10.3)
Chloride: 115 mmol/L — ABNORMAL HIGH (ref 98–111)
Chloride: 116 mmol/L — ABNORMAL HIGH (ref 98–111)
Creatinine, Ser: 1.89 mg/dL — ABNORMAL HIGH (ref 0.61–1.24)
Creatinine, Ser: 1.69 mg/dL — ABNORMAL HIGH (ref 0.61–1.24)
GFR, Estimated: 39 mL/min — ABNORMAL LOW (ref >60)
GFR, Estimated: 44 mL/min — ABNORMAL LOW (ref >60)
Glucose, Bld: 174 mg/dL — ABNORMAL HIGH (ref 70–99)
Glucose, Bld: 153 mg/dL — ABNORMAL HIGH (ref 70–99)
Potassium: 4 mmol/L (ref 3.5–5.1)
Potassium: 3.9 mmol/L (ref 3.5–5.1)
Sodium: 141 mmol/L (ref 135–145)
Sodium: 140 mmol/L (ref 135–145)
Total Bilirubin: 0.5 mg/dL (ref 0.3–1.2)
Total Bilirubin: 0.7 mg/dL (ref 0.3–1.2)
Total Protein: 7.2 g/dL (ref 6.5–8.1)
Total Protein: 6.5 g/dL (ref 6.5–8.1)

## 2021-04-20 LAB — CBC WITH DIFFERENTIAL/PLATELET
Abs Immature Granulocytes: 0.24 10*3/uL — ABNORMAL HIGH (ref 0.00–0.07)
Basophils Absolute: 0.1 10*3/uL (ref 0.0–0.1)
Basophils Relative: 1 %
Eosinophils Absolute: 0.1 10*3/uL (ref 0.0–0.5)
Eosinophils Relative: 0 %
HCT: 35.3 % — ABNORMAL LOW (ref 39.0–52.0)
Hemoglobin: 11.3 g/dL — ABNORMAL LOW (ref 13.0–17.0)
Immature Granulocytes: 1 %
Lymphocytes Relative: 14 %
Lymphs Abs: 2.4 10*3/uL (ref 0.7–4.0)
MCH: 28.7 pg (ref 26.0–34.0)
MCHC: 32 g/dL (ref 30.0–36.0)
MCV: 89.6 fL (ref 80.0–100.0)
Monocytes Absolute: 1 10*3/uL (ref 0.1–1.0)
Monocytes Relative: 6 %
Neutro Abs: 13.6 10*3/uL — ABNORMAL HIGH (ref 1.7–7.7)
Neutrophils Relative %: 78 %
Platelets: 725 10*3/uL — ABNORMAL HIGH (ref 150–400)
RBC: 3.94 MIL/uL — ABNORMAL LOW (ref 4.22–5.81)
RDW: 16.7 % — ABNORMAL HIGH (ref 11.5–15.5)
WBC: 17.5 10*3/uL — ABNORMAL HIGH (ref 4.0–10.5)
nRBC: 0 % (ref 0.0–0.2)

## 2021-04-20 LAB — TROPONIN I (HIGH SENSITIVITY)
Troponin I (High Sensitivity): 52 ng/L — ABNORMAL HIGH (ref <18)
Troponin I (High Sensitivity): 55 ng/L — ABNORMAL HIGH (ref <18)
Troponin I (High Sensitivity): 55 ng/L — ABNORMAL HIGH (ref <18)
Troponin I (High Sensitivity): 56 ng/L — ABNORMAL HIGH (ref <18)

## 2021-04-20 LAB — MAGNESIUM: Magnesium: 1.6 mg/dL — ABNORMAL LOW (ref 1.7–2.4)

## 2021-04-20 LAB — CBC
HCT: 32.4 % — ABNORMAL LOW (ref 39.0–52.0)
Hemoglobin: 10.7 g/dL — ABNORMAL LOW (ref 13.0–17.0)
MCH: 28.7 pg (ref 26.0–34.0)
MCHC: 33 g/dL (ref 30.0–36.0)
MCV: 86.9 fL (ref 80.0–100.0)
Platelets: 657 10*3/uL — ABNORMAL HIGH (ref 150–400)
RBC: 3.73 MIL/uL — ABNORMAL LOW (ref 4.22–5.81)
RDW: 16.4 % — ABNORMAL HIGH (ref 11.5–15.5)
WBC: 16.3 10*3/uL — ABNORMAL HIGH (ref 4.0–10.5)
nRBC: 0 % (ref 0.0–0.2)

## 2021-04-20 LAB — ECHOCARDIOGRAM LIMITED
Height: 68 in
S' Lateral: 4.1 cm
Weight: 2081.14 oz

## 2021-04-20 LAB — GLUCOSE, CAPILLARY
Glucose-Capillary: 146 mg/dL — ABNORMAL HIGH (ref 70–99)
Glucose-Capillary: 166 mg/dL — ABNORMAL HIGH (ref 70–99)
Glucose-Capillary: 243 mg/dL — ABNORMAL HIGH (ref 70–99)

## 2021-04-20 LAB — APTT: aPTT: 31 seconds (ref 24–36)

## 2021-04-20 LAB — CBG MONITORING, ED: Glucose-Capillary: 133 mg/dL — ABNORMAL HIGH (ref 70–99)

## 2021-04-20 LAB — STREP PNEUMONIAE URINARY ANTIGEN: Strep Pneumo Urinary Antigen: NEGATIVE

## 2021-04-20 LAB — PROCALCITONIN: Procalcitonin: 0.1 ng/mL

## 2021-04-20 LAB — RESP PANEL BY RT-PCR (FLU A&B, COVID) ARPGX2
Influenza A by PCR: NEGATIVE
Influenza B by PCR: NEGATIVE
SARS Coronavirus 2 by RT PCR: NEGATIVE

## 2021-04-20 LAB — LACTIC ACID, PLASMA: Lactic Acid, Venous: 1.7 mmol/L (ref 0.5–1.9)

## 2021-04-20 LAB — BRAIN NATRIURETIC PEPTIDE: B Natriuretic Peptide: 859 pg/mL — ABNORMAL HIGH (ref 0.0–100.0)

## 2021-04-20 LAB — PROTIME-INR
INR: 1.4 — ABNORMAL HIGH (ref 0.8–1.2)
Prothrombin Time: 16.7 seconds — ABNORMAL HIGH (ref 11.4–15.2)

## 2021-04-20 LAB — HEMOGLOBIN A1C
Hgb A1c MFr Bld: 7.9 % — ABNORMAL HIGH (ref 4.8–5.6)
Mean Plasma Glucose: 180.03 mg/dL

## 2021-04-20 LAB — PHOSPHORUS: Phosphorus: 3.8 mg/dL (ref 2.5–4.6)

## 2021-04-20 MED ORDER — SODIUM CHLORIDE 0.9 % IV SOLN
1.0000 g | INTRAVENOUS | Status: DC
  Administered 2021-04-21 – 2021-04-22 (×2): 1 g via INTRAVENOUS
  Filled 2021-04-20 (×2): qty 10

## 2021-04-20 MED ORDER — ADULT MULTIVITAMIN W/MINERALS CH
1.0000 | ORAL_TABLET | Freq: Every day | ORAL | Status: DC
  Administered 2021-04-20 – 2021-05-11 (×22): 1 via ORAL
  Filled 2021-04-20 (×22): qty 1

## 2021-04-20 MED ORDER — MEGESTROL ACETATE 400 MG/10ML PO SUSP
400.0000 mg | Freq: Two times a day (BID) | ORAL | Status: DC
  Administered 2021-04-20 – 2021-04-25 (×11): 400 mg via ORAL
  Filled 2021-04-20 (×11): qty 10

## 2021-04-20 MED ORDER — ENOXAPARIN SODIUM 40 MG/0.4ML IJ SOSY: 40.0000 mg | PREFILLED_SYRINGE | INTRAMUSCULAR | Status: DC

## 2021-04-20 MED ORDER — SODIUM CHLORIDE 0.9 % IV SOLN: INTRAVENOUS | Status: DC | PRN

## 2021-04-20 MED ORDER — METHYLPREDNISOLONE SODIUM SUCC 40 MG IJ SOLR
40.0000 mg | Freq: Two times a day (BID) | INTRAMUSCULAR | Status: DC
  Administered 2021-04-20: 40 mg via INTRAVENOUS
  Filled 2021-04-20: qty 1

## 2021-04-20 MED ORDER — NICOTINE 14 MG/24HR TD PT24: 14.0000 mg | MEDICATED_PATCH | Freq: Every day | TRANSDERMAL | Status: DC | PRN

## 2021-04-20 MED ORDER — IPRATROPIUM-ALBUTEROL 0.5-2.5 (3) MG/3ML IN SOLN
3.0000 mL | Freq: Two times a day (BID) | RESPIRATORY_TRACT | Status: DC
Start: 2021-04-21 — End: 2021-04-30
  Administered 2021-04-21 – 2021-04-30 (×19): 3 mL via RESPIRATORY_TRACT
  Filled 2021-04-20 (×18): qty 3

## 2021-04-20 MED ORDER — IPRATROPIUM-ALBUTEROL 0.5-2.5 (3) MG/3ML IN SOLN
3.0000 mL | Freq: Four times a day (QID) | RESPIRATORY_TRACT | Status: DC
  Administered 2021-04-20: 3 mL via RESPIRATORY_TRACT
  Filled 2021-04-20: qty 3

## 2021-04-20 MED ORDER — SODIUM CHLORIDE 0.9 % IV SOLN
1.0000 g | Freq: Once | INTRAVENOUS | Status: AC
  Administered 2021-04-20: 1 g via INTRAVENOUS
  Filled 2021-04-20: qty 10

## 2021-04-20 MED ORDER — SODIUM CHLORIDE 0.9 % IV SOLN
500.0000 mg | Freq: Once | INTRAVENOUS | Status: AC
  Administered 2021-04-20: 500 mg via INTRAVENOUS
  Filled 2021-04-20: qty 500

## 2021-04-20 MED ORDER — APIXABAN 5 MG PO TABS
5.0000 mg | ORAL_TABLET | Freq: Two times a day (BID) | ORAL | Status: DC
  Administered 2021-04-20 – 2021-05-11 (×43): 5 mg via ORAL
  Filled 2021-04-20 (×43): qty 1

## 2021-04-20 MED ORDER — SODIUM CHLORIDE 0.9 % IV SOLN
500.0000 mg | INTRAVENOUS | Status: AC
  Administered 2021-04-21 – 2021-04-24 (×4): 500 mg via INTRAVENOUS
  Filled 2021-04-20 (×4): qty 500

## 2021-04-20 MED ORDER — METHYLPREDNISOLONE SODIUM SUCC 40 MG IJ SOLR
40.0000 mg | Freq: Every day | INTRAMUSCULAR | Status: DC
  Administered 2021-04-20 – 2021-04-21 (×2): 40 mg via INTRAVENOUS
  Filled 2021-04-20 (×2): qty 1

## 2021-04-20 MED ORDER — ACETAMINOPHEN 325 MG PO TABS
650.0000 mg | ORAL_TABLET | Freq: Four times a day (QID) | ORAL | Status: DC | PRN
  Administered 2021-04-28: 650 mg via ORAL
  Filled 2021-04-20: qty 2

## 2021-04-20 MED ORDER — ATORVASTATIN CALCIUM 40 MG PO TABS
40.0000 mg | ORAL_TABLET | Freq: Every day | ORAL | Status: DC
  Administered 2021-04-20 – 2021-05-11 (×22): 40 mg via ORAL
  Filled 2021-04-20 (×22): qty 1

## 2021-04-20 MED ORDER — FOLIC ACID 1 MG PO TABS
1.0000 mg | ORAL_TABLET | Freq: Every day | ORAL | Status: DC
  Administered 2021-04-20 – 2021-05-11 (×22): 1 mg via ORAL
  Filled 2021-04-20 (×22): qty 1

## 2021-04-20 MED ORDER — GLUCERNA SHAKE PO LIQD
237.0000 mL | Freq: Three times a day (TID) | ORAL | Status: DC
  Administered 2021-04-20 – 2021-04-23 (×10): 237 mL via ORAL
  Filled 2021-04-20 (×5): qty 237

## 2021-04-20 MED ORDER — METOPROLOL SUCCINATE ER 25 MG PO TB24
25.0000 mg | ORAL_TABLET | Freq: Every day | ORAL | Status: DC
  Administered 2021-04-20 – 2021-04-21 (×2): 25 mg via ORAL
  Filled 2021-04-20 (×2): qty 1

## 2021-04-20 MED ORDER — MAGNESIUM SULFATE 2 GM/50ML IV SOLN
2.0000 g | Freq: Once | INTRAVENOUS | Status: AC
  Administered 2021-04-20: 2 g via INTRAVENOUS
  Filled 2021-04-20: qty 50

## 2021-04-20 MED ORDER — PANTOPRAZOLE SODIUM 40 MG PO TBEC
40.0000 mg | DELAYED_RELEASE_TABLET | Freq: Two times a day (BID) | ORAL | Status: DC
  Administered 2021-04-20 – 2021-05-11 (×43): 40 mg via ORAL
  Filled 2021-04-20 (×43): qty 1

## 2021-04-20 MED ORDER — AMLODIPINE BESYLATE 5 MG PO TABS
5.0000 mg | ORAL_TABLET | Freq: Every day | ORAL | Status: DC
  Administered 2021-04-20 – 2021-04-22 (×3): 5 mg via ORAL
  Filled 2021-04-20 (×3): qty 1

## 2021-04-20 MED ORDER — INSULIN ASPART 100 UNIT/ML IJ SOLN
0.0000 [IU] | Freq: Three times a day (TID) | INTRAMUSCULAR | Status: DC
  Administered 2021-04-20: 2 [IU] via SUBCUTANEOUS
  Administered 2021-04-21 (×2): 7 [IU] via SUBCUTANEOUS

## 2021-04-20 MED ORDER — THIAMINE HCL 100 MG PO TABS
100.0000 mg | ORAL_TABLET | Freq: Every day | ORAL | Status: DC
  Administered 2021-04-20 – 2021-05-11 (×22): 100 mg via ORAL
  Filled 2021-04-20 (×22): qty 1

## 2021-04-20 MED ORDER — PERFLUTREN LIPID MICROSPHERE
1.0000 mL | INTRAVENOUS | Status: AC | PRN
  Administered 2021-04-20: 2 mL via INTRAVENOUS
  Filled 2021-04-20: qty 10

## 2021-04-20 MED ORDER — LEVOTHYROXINE SODIUM 112 MCG PO TABS
112.0000 ug | ORAL_TABLET | Freq: Every day | ORAL | Status: DC
  Administered 2021-04-20 – 2021-04-23 (×4): 112 ug via ORAL
  Filled 2021-04-20 (×4): qty 1

## 2021-04-20 MED ORDER — INSULIN ASPART 100 UNIT/ML IJ SOLN
0.0000 [IU] | Freq: Every day | INTRAMUSCULAR | Status: DC
  Administered 2021-04-20: 2 [IU] via SUBCUTANEOUS

## 2021-04-20 MED ORDER — DM-GUAIFENESIN ER 30-600 MG PO TB12
1.0000 | ORAL_TABLET | Freq: Two times a day (BID) | ORAL | Status: DC
  Administered 2021-04-20 – 2021-04-29 (×19): 1 via ORAL
  Filled 2021-04-20 (×20): qty 1

## 2021-04-20 NOTE — ED Notes (Signed)
Pt finally resting. Holding synthroid for am rounds.

## 2021-04-20 NOTE — ED Notes (Signed)
Echo being done at this time

## 2021-04-20 NOTE — Progress Notes (Signed)
Report called to [id number] and report given to [employer]. No further questions from receiving nurse at this time. Awaiting call from care link to get report and arrival.

## 2021-04-20 NOTE — Progress Notes (Addendum)
Roan Miklos is a 65 y.o. male recently admitted from 01/10/2021 to 01/15/2021 due to chronic A. fib with RVR, with medical history significant for essential hypertension, hypothyroidism, type 2 diabetes mellitus, chronic atrial fibrillation, status post bilateral AKA, history of polysubstance abuse (alcohol, tobacco and cocaine) who presents to [facility] ED from [facility] via EMS due to shortness of breath.  He was noted to be hypoxic, in the 80% on room air at the SNF, neb treatment was provided and patient was placed on a supplemental oxygen via [place] at 2 LPM with minimal improvement.  EMS was activated and on arrival, O2 supplementation was increased to 4 LPM en route to the hospital.   Work-up revealed acute hypoxic respiratory failure, right-sided community-acquired pneumonia, mild pulmonary edema, elevated troponin, elevated BNP greater than 800, acute on chronic diastolic CHF, AKI, non-anion gap metabolic acidosis, and electrolyte abnormalities.  04/20/2021: Patient was seen and examined at his bedside.  His sister was present in the room.  He reports persistent dyspnea with minimal exertion.  He denies any chest pain.    Moderate conversational dyspnea noted on exam with right-sided JVD and mild diffuse rales bilaterally.  Also noted diffused wheezes on lungs ausculation for which Duoneb Q6H and IV Solumedrol 40 mg daily x 3 days were added.  Patient is concurrently on Rocephin and IV azithromycin for right-sided community-acquired pneumonia.  Monitor QTC while on macrolides.  Ongoing tobacco use, has recently cut down.  Nicotine patch 14 mg daily as needed for nicotine withdrawal added.  Due to concern for worsening AKI and decompensation in the setting of pulmonary edema and acute CHF, patient transferred to [facility] for further evaluation and management of his present medical conditions.  Addendum: Cardiology at [facility] has been paged for a consult.  Please contact cardiology once the  patient arrives at [facility].

## 2021-04-20 NOTE — ED Notes (Signed)
Pt eating breakfast 

## 2021-04-20 NOTE — H&P (Signed)
History and Physical  Phoenix Dresser OIB:[id number] DOB: [birth year] DOA: 04/19/2021  Referring physician: Orpah Greek, MD PCP: [name], FNP  Patient coming from: [facility]  Chief Complaint: Shortness of breath  HPI: Todd [name] is a 65 y.o. male with medical history significant for essential hypertension, hypothyroidism, type 2 diabetes mellitus, chronic atrial fibrillation, status post bilateral AKA, history of polysubstance abuse (alcohol, tobacco and cocaine) and noncompliance with medication treatment regimen who presents to the emergency department via EMS due to shortness of breath.  Patient was unable to tell me the reason why ED presented to the ED at bedside, history was obtained from ED physician and ED medical record.  Per report, patient's shortness of breath started last night, he was noted to be hypoxic with O2 sat in the 80s on room air at the SNF, neb treatment was provided and patient was placed on a supplemental oxygen via [place] at 2 LPM (patient does not use oxygen at baseline) with minimal improvement.  EMS was activated and on arrival of EMS team, patient's O2 sat was increased to 4 LPM en route to the hospital, shortness of breath was noted to be worse when patient was lying flat.  He denies fever, chills, cough, chest pain, nausea or vomiting. He was admitted from 01/10/2021 to 01/15/2021 due to chronic A. fib with RVR  ED Course:  In the emergency department, he was tachypneic, BP was 152/93, other vital signs were within normal range.  Work-up in the ED showed leukocytosis, thrombocytosis, normocytic anemia, BUNs/creatinine at 26/1.89 (baseline creatinine at 0.9-1.2), BNP 859, troponin x1- 52, albumin 2.6, influenza A, B, SARS coronavirus 2 was negative. Chest x-ray showed mild ground-glass opacity in the right thorax, potentially due to pneumonia. Patient was started on IV ceftriaxone and azithromycin.  Hospitalist was asked to admit patient for  further evaluation and management.  Review of Systems: Constitutional: Negative for chills and fever.  HENT: Negative for ear pain and sore throat.   Eyes: Negative for pain and visual disturbance.  Respiratory: Positive for shortness of breath.  Negative for cough  Cardiovascular: Negative for chest pain and palpitations.  Gastrointestinal: Negative for abdominal pain and vomiting.  Endocrine: Negative for polyphagia and polyuria.  Genitourinary: Negative for decreased urine volume, dysuria, enuresis Musculoskeletal: Negative for arthralgias and back pain.  Skin: Negative for color change and rash.  Allergic/Immunologic: Negative for immunocompromised state.  Neurological: Negative for tremors, syncope, speech difficulty, weakness, light-headedness and headaches.  Hematological: Does not bruise/bleed easily.  All other systems reviewed and are negative   Past Medical History:  Diagnosis Date   AKI (acute kidney injury) ([place])    Constipated    Diabetes mellitus without complication ([place])    Diarrhea    Elevated LFTs    Goiter    Gout    Hyperlipidemia    Hypertension    Leukocytosis    Reactive thrombocytosis    Right BKA infection ([place]) 08/2016   Right leg pain    Sepsis due to undetermined organism [facility])    Thyroid disease    Wound infection after surgery 08/2016   Past Surgical History:  Procedure Laterality Date   ABDOMINAL AORTOGRAM N/A 08/11/2016   Procedure: Abdominal Aortogram;  Surgeon: Waynetta Sandy, MD;  Location: [place] [facility];  Service: Cardiovascular;  Laterality: N/A;   ABDOMINAL AORTOGRAM W/LOWER EXTREMITY N/A 08/15/2016   Procedure: Abdominal Aortogram w/Lower Extremity;  Surgeon: Elam Dutch, MD;  Location: [place] [facility];  Service: Cardiovascular;  Laterality: N/A;   AMPUTATION Right 08/17/2016   Procedure: RIGHT BELOW KNEE AMPUTATION;  Surgeon: Elam Dutch, MD;  Location: [place];  Service: Vascular;  Laterality: Right;    AMPUTATION Right 09/12/2016   Procedure: AMPUTATION ABOVE KNEE;  Surgeon: Newt Minion, MD;  Location: [place];  Service: Orthopedics;  Laterality: Right;   AMPUTATION Left 08/12/2016   Procedure: LEFT BELOW KNEE AMPUTATION;  Surgeon: Newt Minion, MD;  Location: [place];  Service: Orthopedics;  Laterality: Left;   AMPUTATION Left 11/01/2017   Procedure: LEFT ABOVE KNEE AMPUTATION;  Surgeon: Newt Minion, MD;  Location: [place];  Service: Orthopedics;  Laterality: Left;   APPLICATION OF WOUND VAC Right 09/12/2016   Procedure: APPLICATION OF WOUND VAC ABOVE KNEE;  Surgeon: Newt Minion, MD;  Location: [place];  Service: Orthopedics;  Laterality: Right;   BIOPSY  01/13/2021   Procedure: BIOPSY;  Surgeon: Rogene Houston, MD;  [facility];  Service: Endoscopy;;  esophageal   COLONOSCOPY WITH PROPOFOL N/A 01/13/2021   Procedure: COLONOSCOPY WITH PROPOFOL;  Surgeon: Rogene Houston, MD;  [facility];  Service: Endoscopy;  Laterality: N/A;   ESOPHAGOGASTRODUODENOSCOPY (EGD) WITH PROPOFOL N/A 01/13/2021   Procedure: ESOPHAGOGASTRODUODENOSCOPY (EGD) WITH PROPOFOL;  Surgeon: Rogene Houston, MD;  [facility];  Service: Endoscopy;  Laterality: N/A;   LOWER EXTREMITY ANGIOGRAPHY Bilateral 08/11/2016   Procedure: Lower Extremity Angiography;  Surgeon: Waynetta Sandy, MD;  Location: [place] [facility];  Service: Cardiovascular;  Laterality: Bilateral;   PERIPHERAL VASCULAR BALLOON ANGIOPLASTY Left 08/11/2016   Procedure: Peripheral Vascular Balloon Angioplasty;  Surgeon: Waynetta Sandy, MD;  Location: [place] [facility];  Service: Cardiovascular;  Laterality: Left;  SFA   POLYPECTOMY  01/13/2021   Procedure: POLYPECTOMY;  Surgeon: Rogene Houston, MD;  [facility];  Service: Endoscopy;;   THYROID SURGERY      Social History:  reports that he has been smoking cigarettes. He has a 11.25 pack-year smoking history. He has never used smokeless tobacco. He  reports current alcohol use of about 2.0 standard drinks per week. He reports that he does not use drugs.   Allergies  Allergen Reactions   Lisinopril Other (See Comments)    Hyperkalemia / Renal failure    Family History  Problem Relation Age of Onset   Heart disease Mother    Pneumonia Father    Diabetes Maternal Aunt    Diabetes Maternal Uncle      Prior to Admission medications   Medication Sig Start Date End Date Taking? Authorizing Provider  Accu-Chek Softclix Lancets lancets  12/25/20   [provider]  acetaminophen (TYLENOL) 325 MG tablet Take 2 tablets (650 mg total) by mouth every 6 (six) hours as needed for mild pain (or Fever >/= 101). 01/15/21   Johnson, Clanford L, MD  allopurinol (ZYLOPRIM) 100 MG tablet Take 1 tablet (100 mg total) by mouth daily. Patient taking differently: Take 300 mg by mouth daily. 12/06/20   Johnson, Clanford L, MD  amLODipine (NORVASC) 5 MG tablet Take 1 tablet (5 mg total) by mouth daily. 01/15/21 01/15/22  Johnson, Clanford L, MD  apixaban (ELIQUIS) 5 MG TABS tablet Take 1 tablet (5 mg total) by mouth 2 (two) times daily. 01/16/21   Johnson, Clanford L, MD  atorvastatin (LIPITOR) 40 MG tablet TAKE 1 TABLET BY MOUTH ONCE DAILY AT 6 PM 12/06/20   Johnson, Clanford L,  MD  blood glucose meter kit and supplies Dispense based on patient and insurance preference. Use up to four times daily as directed. (FOR ICD-10 E10.9, E11.9). 12/06/20   Johnson, Clanford L, MD  calcium carbonate (TUMS - DOSED IN MG ELEMENTAL CALCIUM) 500 MG chewable tablet Chew 2 tablets (400 mg of elemental calcium total) by mouth 2 (two) times daily with a meal. 01/15/21   Johnson, Clanford L, MD  cetirizine (ZYRTEC) 10 MG tablet Take 1 tablet (10 mg total) by mouth daily as needed for allergies. For allergy symptoms 12/06/20   Murlean Iba, MD  cholecalciferol (VITAMIN D3) 25 MCG (1000 UNIT) tablet Take 1,000 Units by mouth daily.    [provider]  folic acid  (FOLVITE) 1 MG tablet Take 1 tablet (1 mg total) by mouth daily. 12/29/20   Roxan Hockey, MD  glucose blood (ACCU-CHEK AVIVA PLUS) test strip Check blood sugars four times a day 03/12/18   [name], MD  levothyroxine (SYNTHROID) 112 MCG tablet Take 1 tablet (112 mcg total) by mouth daily before breakfast. 02/25/21 02/25/22  Brita Romp, NP  megestrol (MEGACE) 400 MG/10ML suspension Take 10 mLs (400 mg total) by mouth 2 (two) times daily. For appetite stimulation 12/06/20   Johnson, Clanford L, MD  metoprolol succinate (TOPROL XL) 25 MG 24 hr tablet Take 1 tablet (25 mg total) by mouth daily. 12/29/20 12/29/21  Roxan Hockey, MD  Multiple Vitamin (MULTIVITAMIN WITH MINERALS) TABS tablet Take 1 tablet by mouth daily. 01/16/21   Johnson, Clanford L, MD  Nutritional Supplements (NUTRITIONAL SHAKE HIGH PROTEIN) LIQD Take by mouth.    [provider]  Nystatin (GERHARDT'S BUTT CREAM) CREA Apply 1 application topically 3 (three) times daily. 01/15/21   Johnson, Clanford L, MD  pantoprazole (PROTONIX) 40 MG tablet Take 1 tablet (40 mg total) by mouth 2 (two) times daily. 01/15/21   Johnson, Clanford L, MD  polyethylene glycol (MIRALAX / GLYCOLAX) 17 g packet Take 17 g by mouth daily. 01/16/21   Johnson, Clanford L, MD  sertraline (ZOLOFT) 50 MG tablet Take 1 tablet (50 mg total) by mouth at bedtime. 12/06/20   Johnson, Clanford L, MD  silver sulfADIAZINE (SILVADENE) 1 % cream Apply 1 application topically daily. Apply to buttock    [provider]  thiamine 100 MG tablet Take 1 tablet (100 mg total) by mouth daily. 12/29/20   Roxan Hockey, MD    Physical Exam: BP (!) 146/81   Pulse 82   Temp 98 F (36.7 C) (Oral)   Resp 19   Ht 5' 8" (1.727 m)   Wt 59 kg   SpO2 97%   BMI 19.78 kg/m   General: 65 y.o. year-old male well developed well nourished in no acute distress.  Alert and oriented x3. HEENT: NCAT, EOMI Neck: Supple, trachea medial Cardiovascular: Regular rate and  rhythm with no rubs or gallops.  No thyromegaly or JVD noted.  2/4 pulses in all 4 extremities. Respiratory: Tachypnea.  Rales auscultated in the right middle and lower lobes.   Abdomen: Soft, nontender nondistended with normal bowel sounds x4 quadrants. Muskuloskeletal: Bilateral AKA with healed stumps.  No cyanosis or clubbing noted  Neuro: CN II-XII intact, sensation, reflexes intact Skin: No ulcerative lesions noted or rashes Psychiatry: Mood is appropriate for condition and setting          Labs on Admission:  Basic Metabolic Panel: Recent Labs  Lab 04/19/21 0019 04/20/21 0254  NA 141  --  K 4.0  --   CL 115*  --   CO2 18*  --   GLUCOSE 174*  --   BUN 26*  --   CREATININE 1.89*  --   CALCIUM 7.9*  --   MG  --  1.6*  PHOS  --  3.8   Liver Function Tests: Recent Labs  Lab 04/19/21 0019  AST 19  ALT 21  ALKPHOS 136*  BILITOT 0.5  PROT 7.2  ALBUMIN 2.6*   No results for input(s): LIPASE, AMYLASE in the last 168 hours. No results for input(s): AMMONIA in the last 168 hours. CBC: Recent Labs  Lab 04/19/21 0019  WBC 17.5*  NEUTROABS 13.6*  HGB 11.3*  HCT 35.3*  MCV 89.6  PLT 725*   Cardiac Enzymes: No results for input(s): CKTOTAL, CKMB, CKMBINDEX, TROPONINI in the last 168 hours.  BNP (last 3 results) Recent Labs    04/19/21 0019  BNP 859.0*    ProBNP (last 3 results) No results for input(s): PROBNP in the last 8760 hours.  CBG: No results for input(s): GLUCAP in the last 168 hours.  Radiological Exams on Admission: DG Chest Port 1 View  Result Date: 04/20/2021 CLINICAL DATA:  Shortness of breath EXAM: PORTABLE CHEST 1 VIEW COMPARISON:  01/10/2021, 12/04/2020 FINDINGS: Normal cardiac size. Mild diffuse interstitial thickening with mild ground-glass opacity in the right thorax. No pleural effusion or pneumothorax. Aortic atherosclerosis. Postsurgical changes at the thoracic inlet. IMPRESSION: 1. Mild ground-glass opacity in the right thorax,  potentially due to pneumonia. 2. Mild background chronic bronchitic change Electronically Signed   By: Donavan Foil M.D.   On: 04/20/2021 00:05    EKG: I independently viewed the EKG done and my findings are as followed: Sinus tachycardia at a rate of 100 bpm with nonspecific ST abnormality in lateral leads  Assessment/Plan Present on Admission:  CAP (community acquired pneumonia)  Leukocytosis  Reactive thrombocytosis  AKI (acute kidney injury) ([place])  Hypomagnesemia  Principal Problem:   CAP (community acquired pneumonia) Active Problems:   Essential hypertension   Acquired hypothyroidism   Elevated troponin   Acute respiratory failure with hypoxia ([place])   Hyperglycemia due to diabetes mellitus ([facility])   Leukocytosis   Reactive thrombocytosis   S/P AKA (above knee amputation) bilateral ([facility])   AKI (acute kidney injury) ([place])   Atrial fibrillation, chronic ([facility])   Hypomagnesemia   Elevated brain natriuretic peptide (BNP) level   Polysubstance abuse ([facility])   Noncompliance with medication regimen   Chronic diastolic CHF (congestive heart failure) ([facility])   Hypoalbuminemia due to protein-calorie malnutrition ([facility])   GERD (gastroesophageal reflux disease)   Acute respiratory failure with hypoxia possibly secondary to CAP POA Chest x-ray was suggestive of right-sided pneumonia Continue Mucinex, ceftriaxone, azithromycin with plan to de-escalate/discontinue based on blood culture, sputum culture, urine Legionella, strep pneumo and procalcitonin Continue Tylenol as needed Continue  incentive spirometry, flutter valve and chest PT Blood culture and sputum culture pending Continue supplemental oxygen via [place] to maintain O2 sat > 92% we will plan to wean patient off supplemental oxygen as tolerated (patient does not use oxygen at baseline).  Leukocytosis secondary to above Continue treatment as described above  Elevated BNP rule out acute on chronic diastolic CHF  BNP 003, this may be  unreliable due to acute kidney injury Continue total input/output, daily weights and fluid restriction Continue Cardiac diet  Echocardiogram done on 12/27/2020 showed LVEF of 55 to 60%.  LV has no RWMA.  Left ventricular  diastolic parameters are consistent with grade 1 diastolic dysfunction.  Echocardiogram will be done in the morning   Elevated troponin possibly secondary to type II demand ischemia Troponin x 2 - 52 > 55; patient denies chest pain No acute ischemic changes on EKG Continue to trend troponin  Hyperglycemia secondary to type 2 diabetes mellitus (diet-controlled) CBG 174; Hemoglobin A1c on 12/04/2020 was 6.0 Continue CBG  Acute kidney injury BUN/creatinine at 26/1.89 (baseline creatinine at 0.9-1.2) Patient will require some gentle hydration after acute CHF is ruled out with echo in the morning Renally adjust medications, avoid nephrotoxic agents/dehydration/hypotension  Hypomagnesemia Mg level is 1.6 This will be replenished Please continue to monitor Mg level and correct accordingly  Hypoalbuminemia secondary to moderate protein calorie malnutrition Albumin 2.6, protein supplement to be provided  Acquired hypothyroidism Continue Synthroid  Essential hypertension Continue amlodipine, metoprolol  Chronic atrial fibrillation Continue Toprol-XL, Eliquis  GERD Continue Protonix  Polysubstance abuse (alcohol, tobacco and cocaine) Continue folic acid, thiamine, multivitamin  Noncompliance with medication regimen Patient was counseled on importance of being compliant with medication regimen   DVT prophylaxis: Eliquis  Code Status: Full code  Family Communication: None at bedside  Disposition Plan:  Patient is from:                        home Anticipated DC to:                   SNF or family members home Anticipated DC date:               2-3 days Anticipated DC barriers:          Patient requires inpatient management due to hypoxia requiring  supplemental oxygen in the setting of community-acquired pneumonia requiring IV antibiotics   Consults called: None  Admission status: Observation   Bernadette Hoit MD [facility]  04/20/2021, 4:29 AM

## 2021-04-20 NOTE — Progress Notes (Signed)
*  PRELIMINARY RESULTS* Echocardiogram A Limited 2D Echocardiogram with Definity has been performed.  Todd [name] 04/20/2021, 9:28 AM

## 2021-04-21 DIAGNOSIS — J9601 Acute respiratory failure with hypoxia: Secondary | ICD-10-CM | POA: Diagnosis not present

## 2021-04-21 DIAGNOSIS — E039 Hypothyroidism, unspecified: Secondary | ICD-10-CM | POA: Diagnosis not present

## 2021-04-21 DIAGNOSIS — N179 Acute kidney failure, unspecified: Secondary | ICD-10-CM | POA: Diagnosis not present

## 2021-04-21 DIAGNOSIS — J189 Pneumonia, unspecified organism: Secondary | ICD-10-CM | POA: Diagnosis not present

## 2021-04-21 LAB — LEGIONELLA PNEUMOPHILA SEROGP 1 UR AG: L. pneumophila Serogp 1 Ur Ag: NEGATIVE

## 2021-04-21 LAB — MAGNESIUM: Magnesium: 2.5 mg/dL — ABNORMAL HIGH (ref 1.7–2.4)

## 2021-04-21 LAB — GLUCOSE, CAPILLARY
Glucose-Capillary: 348 mg/dL — ABNORMAL HIGH (ref 70–99)
Glucose-Capillary: 316 mg/dL — ABNORMAL HIGH (ref 70–99)
Glucose-Capillary: 409 mg/dL — ABNORMAL HIGH (ref 70–99)
Glucose-Capillary: 410 mg/dL — ABNORMAL HIGH (ref 70–99)
Glucose-Capillary: 361 mg/dL — ABNORMAL HIGH (ref 70–99)

## 2021-04-21 LAB — PHOSPHORUS: Phosphorus: 5.1 mg/dL — ABNORMAL HIGH (ref 2.5–4.6)

## 2021-04-21 MED ORDER — INSULIN GLARGINE-YFGN 100 UNIT/ML ~~LOC~~ SOLN
7.0000 [IU] | Freq: Every day | SUBCUTANEOUS | Status: DC
  Administered 2021-04-21: 7 [IU] via SUBCUTANEOUS
  Filled 2021-04-21 (×2): qty 0.07

## 2021-04-21 MED ORDER — INSULIN ASPART 100 UNIT/ML IJ SOLN
3.0000 [IU] | Freq: Three times a day (TID) | INTRAMUSCULAR | Status: DC
  Administered 2021-04-21 – 2021-05-06 (×30): 3 [IU] via SUBCUTANEOUS

## 2021-04-21 MED ORDER — INSULIN ASPART 100 UNIT/ML IJ SOLN
10.0000 [IU] | Freq: Once | INTRAMUSCULAR | Status: AC
  Administered 2021-04-21: 10 [IU] via INTRAVENOUS

## 2021-04-21 MED ORDER — INSULIN ASPART 100 UNIT/ML IJ SOLN
0.0000 [IU] | Freq: Three times a day (TID) | INTRAMUSCULAR | Status: DC
  Administered 2021-04-21: 9 [IU] via SUBCUTANEOUS
  Administered 2021-04-22: 7 [IU] via SUBCUTANEOUS
  Administered 2021-04-22: 5 [IU] via SUBCUTANEOUS
  Administered 2021-04-23: 2 [IU] via SUBCUTANEOUS
  Administered 2021-04-23 (×2): 1 [IU] via SUBCUTANEOUS
  Administered 2021-04-24 – 2021-04-26 (×2): 2 [IU] via SUBCUTANEOUS
  Administered 2021-04-26 – 2021-04-28 (×3): 1 [IU] via SUBCUTANEOUS
  Administered 2021-04-28: 3 [IU] via SUBCUTANEOUS
  Administered 2021-04-29: 10:00:00 1 [IU] via SUBCUTANEOUS
  Administered 2021-04-29: 12:00:00 3 [IU] via SUBCUTANEOUS
  Administered 2021-04-29: 17:00:00 1 [IU] via SUBCUTANEOUS
  Administered 2021-04-30 (×2): 3 [IU] via SUBCUTANEOUS
  Administered 2021-04-30: 2 [IU] via SUBCUTANEOUS
  Administered 2021-05-01 (×2): 3 [IU] via SUBCUTANEOUS
  Administered 2021-05-02: 1 [IU] via SUBCUTANEOUS
  Administered 2021-05-02: 2 [IU] via SUBCUTANEOUS
  Administered 2021-05-02: 1 [IU] via SUBCUTANEOUS
  Administered 2021-05-03: 3 [IU] via SUBCUTANEOUS
  Administered 2021-05-04: 1 [IU] via SUBCUTANEOUS
  Administered 2021-05-06: 2 [IU] via SUBCUTANEOUS
  Administered 2021-05-07 – 2021-05-10 (×4): 1 [IU] via SUBCUTANEOUS
  Administered 2021-05-10: 13:00:00 2 [IU] via SUBCUTANEOUS
  Administered 2021-05-11: 1 [IU] via SUBCUTANEOUS

## 2021-04-21 MED ORDER — METOPROLOL TARTRATE 25 MG PO TABS
25.0000 mg | ORAL_TABLET | Freq: Once | ORAL | Status: AC
Start: 2021-04-21 — End: 2021-04-21
  Administered 2021-04-21: 25 mg via ORAL
  Filled 2021-04-21: qty 1

## 2021-04-21 MED ORDER — INSULIN ASPART 100 UNIT/ML IJ SOLN
0.0000 [IU] | Freq: Every day | INTRAMUSCULAR | Status: DC
Start: 2021-04-21 — End: 2021-05-11
  Administered 2021-04-28: 2 [IU] via SUBCUTANEOUS

## 2021-04-21 MED ORDER — FUROSEMIDE 10 MG/ML IJ SOLN
40.0000 mg | Freq: Once | INTRAMUSCULAR | Status: AC
  Administered 2021-04-21: 40 mg via INTRAVENOUS
  Filled 2021-04-21: qty 4

## 2021-04-21 MED ORDER — METOPROLOL SUCCINATE ER 50 MG PO TB24
50.0000 mg | ORAL_TABLET | Freq: Every day | ORAL | Status: DC
  Administered 2021-04-22 – 2021-04-23 (×2): 50 mg via ORAL
  Filled 2021-04-21 (×2): qty 1

## 2021-04-21 NOTE — Progress Notes (Signed)
PROGRESS NOTE  Todd [name]  TZG:[id number] DOB: 1955/10/06 DOA: 04/19/2021 PCP: [name], FNP  Brief Narrative: Todd [name] is a 65 y.o. male recently admitted from 01/10/2021 to 01/15/2021 due to chronic A. fib with RVR, with medical history significant for essential hypertension, hypothyroidism, type 2 diabetes mellitus, chronic atrial fibrillation, status post bilateral AKA, history of polysubstance abuse (alcohol, tobacco and cocaine) who presents to [facility] ED from [facility] via EMS due to shortness of breath.  He was noted to be hypoxic, in the 80% on room air at the SNF, neb treatment was provided and patient was placed on a supplemental oxygen via [place] at 2 LPM with minimal improvement.  EMS was activated and on arrival, O2 supplementation was increased to 4 LPM en route to the hospital.   Work-up revealed acute hypoxic respiratory failure, right-sided community-acquired pneumonia, mild pulmonary edema, elevated troponin, elevated BNP greater than 800, acute on chronic diastolic CHF, AKI, non-anion gap metabolic acidosis, and electrolyte abnormalities.  Assessment & Plan: Principal Problem:   CAP (community acquired pneumonia) Active Problems:   Essential hypertension   Acquired hypothyroidism   Elevated troponin   Acute respiratory failure with hypoxia (HCC)   Hyperglycemia due to diabetes mellitus (HCC)   Leukocytosis   Reactive thrombocytosis   S/P AKA (above knee amputation) bilateral (HCC)   AKI (acute kidney injury) ([place])   Atrial fibrillation, chronic (HCC)   Hypomagnesemia   Elevated brain natriuretic peptide (BNP) level   Polysubstance abuse (HCC)   Noncompliance with medication regimen   Chronic diastolic CHF (congestive heart failure) (HCC)   Hypoalbuminemia due to protein-calorie malnutrition (HCC)   GERD (gastroesophageal reflux disease)  Acute hypoxic respiratory failure: Presumed to be due to CHF and right-sided pneumonia, COPD exacerbation also  possible given previous reports of wheezing. Bronchitic changes on CXR with hazy ground glass appearance of right lung. - Treat conditions as below, wean from oxygen as able.   Chronic atrial fibrillation: Recent admit for RVR.  - Continue metoprolol succinate, increase dose with high BP and HR's. Give 25mg  tartrate now and increase to 50mg  dose in AM. - Continue eliquis. Hgb roughly stable - Continue telemetry which has shown intermittent rapid rates and limited NSVT which was asymptomatic.  - Maintain K >4, Mg >2  Acute on chronic HFrEF, HTN: LVEF 45-50% without RWMA, +LVH, normal IVC. Severe LAE, moderate RAE. BNP elevated. ?tachyarrhythmia-induced.  - Cardiology consulted given now mild LV systolic dysfunction.  - Continue norvasc, high risk to initiate beta blocker with cocaine use but resides at SNF, on metoprolol succinate.   AKI: No abnormality on U/S, though did have trace perinephric fluid on right.  - Improved, monitor serially. Avoid nephrotoxins.   Demand myocardial ischemia: Mild troponin elevation with reassuring delta no anginal complaints.  - Defer w/u to cardiology  Right-sided CAP:  - Continue 5 days abx with CTX, azithromycin for now.  - Monitor culture data.   COPD with exacerbation:  - Continue nebs. Given resolution of wheezing and severe steroid-induced hyperglycemia, will not continue steroids for right now.  Uncontrolled T2DM: HbA1c 7.9%.  - Continue SSI, will augment Tx by adding mealtime insulin and start low dose basal insulin (0.1u/kg) given his improved renal clearance and steroid-induced hyperglycemia  Polysubstance abuse: Previously +alcohol, tobacco, cocaine.  - Cessation counseling. Monitor for withdrawal, continue nicotine patch.   Hypothyroidism:  - Repeat TSH, last was 20.52 in September 2022 which was improved from 189 in July in setting of medication  nonadherence. - Continue synthroid  HTN: Tx as above  PVD s/p bilateral AKA (2018-2019):   - Continue statin, anticoagulation  GERD:  - Continue PPI  DVT prophylaxis: Eliquis Code Status: Full Family Communication: None at bedside Disposition Plan:  Status is: Inpatient  Remains inpatient appropriate because: requires treatment for acute CHF and pneumonia.   Consultants:  Cardiology  Procedures:  Echocardiogram  Antimicrobials: Ceftriaxone, azithromycin   Subjective: Breathing better, not sure why he was admitted to [facility] from [facility]. Has no swelling, no chest pain, no palpitations, no bleeding.  Objective: Vitals:   04/21/21 0337 04/21/21 0742 04/21/21 1033 04/21/21 1134  BP: (!) 155/89 (!) 161/94  120/81  Pulse: 83 88 (!) 103 100  Resp: (!) 21 20 (!) 21 (!) 22  Temp: 98.2 F (36.8 C) 98.2 F (36.8 C)  98.2 F (36.8 C)  TempSrc: Oral Oral  Oral  SpO2: 100%  100%   Weight: 68.3 kg     Height:        Intake/Output Summary (Last 24 hours) at 04/21/2021 1417 Last data filed at 04/21/2021 0408 Gross per 24 hour  Intake 866.44 ml  Output 350 ml  Net 516.44 ml   Filed Weights   04/19/21 2335 04/20/21 1033 04/21/21 0337  Weight: 59 kg 68.2 kg 68.3 kg    Gen: 65 y.o. male in no distress, chronically ill-appearing Pulm: Non-labored breathing tachypnea without crackles or wheezes.  CV: Rapid irregular. No murmur, rub, or gallop. No JVD or pitting dependent edema. GI: Abdomen soft, non-tender, non-distended, with normoactive bowel sounds. No organomegaly or masses felt. Ext: Warm, no deformities Skin: No rashes, lesions or ulcers on visualized, dry, skin Neuro: Alert and oriented. No focal neurological deficits. Psych: Judgement and insight appear normal. Mood & affect appropriate.   Data Reviewed: I have personally reviewed following labs and imaging studies  CBC: Recent Labs  Lab 04/19/21 0019 04/20/21 1507  WBC 17.5* 16.3*  NEUTROABS 13.6*  --   HGB 11.3* 10.7*  HCT 35.3* 32.4*  MCV 89.6 86.9  PLT 725* 657*   Basic  Metabolic Panel: Recent Labs  Lab 04/19/21 0019 04/20/21 0254 04/20/21 1507 04/21/21 0327  NA 141  --  140  --   K 4.0  --  3.9  --   CL 115*  --  116*  --   CO2 18*  --  16*  --   GLUCOSE 174*  --  153*  --   BUN 26*  --  26*  --   CREATININE 1.89*  --  1.69*  --   CALCIUM 7.9*  --  7.9*  --   MG  --  1.6*  --  2.5*  PHOS  --  3.8  --  5.1*   GFR: Estimated Creatinine Clearance: 42.1 mL/min (A) (by C-G formula based on SCr of 1.69 mg/dL (H)). Liver Function Tests: Recent Labs  Lab 04/19/21 0019 04/20/21 1507  AST 19 17  ALT 21 18  ALKPHOS 136* 100  BILITOT 0.5 0.7  PROT 7.2 6.5  ALBUMIN 2.6* 2.4*   No results for input(s): LIPASE, AMYLASE in the last 168 hours. No results for input(s): AMMONIA in the last 168 hours. Coagulation Profile: Recent Labs  Lab 04/20/21 0254  INR 1.4*   Cardiac Enzymes: No results for input(s): CKTOTAL, CKMB, CKMBINDEX, TROPONINI in the last 168 hours. BNP (last 3 results) No results for input(s): PROBNP in the last 8760 hours. HbA1C: Recent Labs  04/20/21 1507  HGBA1C 7.9*   CBG: Recent Labs  Lab 04/20/21 1116 04/20/21 1634 04/20/21 2036 04/21/21 0740 04/21/21 1133  GLUCAP 146* 166* 243* 348* 316*   Lipid Profile: No results for input(s): CHOL, HDL, LDLCALC, TRIG, CHOLHDL, LDLDIRECT in the last 72 hours. Thyroid Function Tests: No results for input(s): TSH, T4TOTAL, FREET4, T3FREE, THYROIDAB in the last 72 hours. Anemia Panel: No results for input(s): VITAMINB12, FOLATE, FERRITIN, TIBC, IRON, RETICCTPCT in the last 72 hours. Urine analysis:    Component Value Date/Time   COLORURINE AMBER (A) 01/11/2021 1030   APPEARANCEUR CLOUDY (A) 01/11/2021 1030   APPEARANCEUR Clear 02/09/2018 1352   LABSPEC 1.017 01/11/2021 1030   PHURINE 5.0 01/11/2021 1030   GLUCOSEU NEGATIVE 01/11/2021 1030   HGBUR SMALL (A) 01/11/2021 1030   BILIRUBINUR NEGATIVE 01/11/2021 1030   BILIRUBINUR Negative 02/09/2018 1352   KETONESUR  NEGATIVE 01/11/2021 1030   PROTEINUR 30 (A) 01/11/2021 1030   NITRITE NEGATIVE 01/11/2021 1030   LEUKOCYTESUR MODERATE (A) 01/11/2021 1030   Recent Results (from the past 240 hour(s))  Resp Panel by RT-PCR (Flu A&B, Covid) Nasopharyngeal Swab     Status: None   Collection Time: 04/19/21 12:12 AM   Specimen: Nasopharyngeal Swab; Nasopharyngeal(NP) swabs in vial transport medium  Result Value Ref Range Status   SARS Coronavirus 2 by RT PCR NEGATIVE NEGATIVE Final    Comment: (NOTE) SARS-CoV-2 target nucleic acids are NOT DETECTED.  The SARS-CoV-2 RNA is generally detectable in upper respiratory specimens during the acute phase of infection. The lowest concentration of SARS-CoV-2 viral copies this assay can detect is 138 copies/mL. A negative result does not preclude SARS-Cov-2 infection and should not be used as the sole basis for treatment or other patient management decisions. A negative result may occur with  improper specimen collection/handling, submission of specimen other than nasopharyngeal swab, presence of viral mutation(s) within the areas targeted by this assay, and inadequate number of viral copies(<138 copies/mL). A negative result must be combined with clinical observations, patient history, and epidemiological information. The expected result is Negative.  Fact Sheet for Patients:  EntrepreneurPulse.com.au  Fact Sheet for Healthcare Providers:  IncredibleEmployment.be  This test is no t yet approved or cleared by the [place] FDA and  has been authorized for detection and/or diagnosis of SARS-CoV-2 by FDA under an Emergency Use Authorization (EUA). This EUA will remain  in effect (meaning this test can be used) for the duration of the COVID-19 declaration under Section 564(b)(1) of the Act, 21 U.S.C.section 360bbb-3(b)(1), unless the authorization is terminated  or revoked sooner.       Influenza A by PCR NEGATIVE  NEGATIVE Final   Influenza B by PCR NEGATIVE NEGATIVE Final    Comment: (NOTE) The Xpert Xpress SARS-CoV-2/FLU/RSV plus assay is intended as an aid in the diagnosis of influenza from Nasopharyngeal swab specimens and should not be used as a sole basis for treatment. Nasal washings and aspirates are unacceptable for Xpert Xpress SARS-CoV-2/FLU/RSV testing.  Fact Sheet for Patients: EntrepreneurPulse.com.au  Fact Sheet for Healthcare Providers: IncredibleEmployment.be  This test is not yet approved or cleared by the [place] FDA and has been authorized for detection and/or diagnosis of SARS-CoV-2 by FDA under an Emergency Use Authorization (EUA). This EUA will remain in effect (meaning this test can be used) for the duration of the COVID-19 declaration under Section 564(b)(1) of the Act, 21 U.S.C. section 360bbb-3(b)(1), unless the authorization is terminated or revoked.  Performed at [facility], [phone]  [address], [place], [place] [address]   Culture, blood (Routine X 2) w Reflex to ID Panel     Status: None (Preliminary result)   Collection Time: 04/20/21 12:52 AM   Specimen: BLOOD  Result Value Ref Range Status   Specimen Description BLOOD LEFT ANTECUBITAL  Final   Special Requests   Final    BOTTLES DRAWN AEROBIC AND ANAEROBIC Blood Culture adequate volume   Culture   Final    NO GROWTH 1 DAY Performed at [facility], [address] [place], [place] [address]    Report Status PENDING  Incomplete  Culture, blood (Routine X 2) w Reflex to ID Panel     Status: None (Preliminary result)   Collection Time: 04/20/21 12:52 AM   Specimen: BLOOD  Result Value Ref Range Status   Specimen Description BLOOD BLOOD LEFT HAND  Final   Special Requests   Final    BOTTLES DRAWN AEROBIC AND ANAEROBIC Blood Culture adequate volume   Culture   Final    NO GROWTH 1 DAY Performed at [facility], [address], [place], [place] [address]    Report  Status PENDING  Incomplete      Radiology Studies: US RENAL  Result Date: 04/20/2021 CLINICAL DATA:  Acute kidney injury EXAM: RENAL / URINARY TRACT ULTRASOUND COMPLETE COMPARISON:  10/26/2017 FINDINGS: Right Kidney: Renal measurements: 11.8 x 5.7 x 6.4 cm = volume: 226.4 mL. Echogenicity within normal limits. No mass or hydronephrosis visualized. Trace right perinephric fluid. Left Kidney: Renal measurements: 11.6 x 6.2 x 6.4 cm = volume: 243.1 mL. Echogenicity within normal limits. No mass or hydronephrosis visualized. Bladder: Appears normal for degree of bladder distention. Other: None. IMPRESSION: Negative examination. Electronically Signed   By: Donavan Foil M.D.   On: 04/20/2021 15:42   DG Chest Port 1 View  Result Date: 04/20/2021 CLINICAL DATA:  Shortness of breath EXAM: PORTABLE CHEST 1 VIEW COMPARISON:  01/10/2021, 12/04/2020 FINDINGS: Normal cardiac size. Mild diffuse interstitial thickening with mild ground-glass opacity in the right thorax. No pleural effusion or pneumothorax. Aortic atherosclerosis. Postsurgical changes at the thoracic inlet. IMPRESSION: 1. Mild ground-glass opacity in the right thorax, potentially due to pneumonia. 2. Mild background chronic bronchitic change Electronically Signed   By: Donavan Foil M.D.   On: 04/20/2021 00:05   ECHOCARDIOGRAM LIMITED  Result Date: 04/20/2021    ECHOCARDIOGRAM LIMITED REPORT   Patient Name:   Todd [name] Date of Exam: 04/20/2021 Medical Rec #:  [id number]       Height:       68.0 in Accession #:    [id number]      Weight:       130.1 lb Date of Birth:  November 23, 1955      BSA:          1.702 m Patient Age:    27 years        BP:           154/81 mmHg Patient Gender: M               HR:           87 bpm. Exam Location:  [facility] Procedure: Limited Echo and Intracardiac Opacification Agent Indications:    CHF-Acute Diastolic  History:        Patient has prior history of Echocardiogram examinations, most                 recent  12/27/2020. CHF, Arrythmias:Atrial Fibrillation,  Signs/Symptoms:Syncope; Risk Factors:Current Smoker,                 Hypertension, Diabetes and Dyslipidemia. Cocaine and Alcohol                 abuse.  Sonographer:    Wenda Low Referring Phys: [id number] OLADAPO ADEFESO  Sonographer Comments: Technically challenging study due to limited acoustic windows. IMPRESSIONS  1. Left ventricular ejection fraction, by estimation, is 45 to 50%. The left ventricle has mildly decreased function. There is moderate left ventricular hypertrophy.  2. Right ventricular systolic function is normal. The right ventricular size is normal.  3. Left atrial size was severely dilated.  4. Right atrial size was moderately dilated.  5. The inferior vena cava is normal in size with greater than 50% respiratory variability, suggesting right atrial pressure of 3 mmHg.  6. Limited echo evaluate LV function. FINDINGS  Left Ventricle: Left ventricular ejection fraction, by estimation, is 45 to 50%. The left ventricle has mildly decreased function. Definity contrast agent was given IV to delineate the left ventricular endocardial borders. There is moderate left ventricular hypertrophy. Right Ventricle: The right ventricular size is normal. Right vetricular wall thickness was not well visualized. Right ventricular systolic function is normal. Left Atrium: Left atrial size was severely dilated. Right Atrium: Right atrial size was moderately dilated. Pericardium: There is no evidence of pericardial effusion. Venous: The inferior vena cava is normal in size with greater than 50% respiratory variability, suggesting right atrial pressure of 3 mmHg. LEFT VENTRICLE PLAX 2D LVIDd:         4.90 cm LVIDs:         4.10 cm LV PW:         1.30 cm LV IVS:        1.30 cm LVOT diam:     2.00 cm LVOT Area:     3.14 cm  RIGHT VENTRICLE RV Basal diam:  3.60 cm RV Mid diam:    2.90 cm TAPSE (M-mode): 2.3 cm LEFT ATRIUM              Index        RIGHT  ATRIUM           Index LA diam:        4.10 cm  2.41 cm/m   RA Area:     20.70 cm LA Vol (A2C):   127.0 ml 74.61 ml/m  RA Volume:   63.10 ml  37.07 ml/m LA Vol (A4C):   65.8 ml  38.66 ml/m LA Biplane Vol: 96.6 ml  56.75 ml/m   SHUNTS Systemic Diam: 2.00 cm Carlyle Dolly MD Electronically signed by Carlyle Dolly MD Signature Date/Time: 04/20/2021/11:20:36 AM    Final     Scheduled Meds:  amLODipine  5 mg Oral Daily   apixaban  5 mg Oral BID   atorvastatin  40 mg Oral Daily   dextromethorphan-guaiFENesin  1 tablet Oral BID   feeding supplement (GLUCERNA SHAKE)  237 mL Oral TID BM   folic acid  1 mg Oral Daily   insulin aspart  0-5 Units Subcutaneous QHS   insulin aspart  0-9 Units Subcutaneous TID WC   ipratropium-albuterol  3 mL Nebulization BID   levothyroxine  112 mcg Oral QAC breakfast   megestrol  400 mg Oral BID   methylPREDNISolone (SOLU-MEDROL) injection  40 mg Intravenous Daily   metoprolol succinate  25 mg Oral Daily   multivitamin with minerals  1 tablet Oral Daily  pantoprazole  40 mg Oral BID   thiamine  100 mg Oral Daily   Continuous Infusions:  sodium chloride Stopped (04/20/21 0916)   azithromycin 500 mg (04/21/21 0408)   cefTRIAXone (ROCEPHIN)  IV 1 g (04/21/21 0335)     LOS: 1 day   Time spent: 35 minutes.  Patrecia Pour, MD [facility] [email or web address] 04/21/2021, 2:17 PM

## 2021-04-21 NOTE — Consult Note (Signed)
   [facility] Inpatient Consult   04/21/2021  Havier Deeb 06-Jan-1956 [id number]  [place] Organization [ACO] Patient: [employer]   Primary Care Provider:  [name], FNP, [facility], Embedded  Patient screened for 4 hospitalizations in 6 months with noted extreme high risk score for unplanned readmission risk.   Review of patient's medical record reveals patient is to return to SNF.  Spoke with inpatient TOC and this is the current plan to return to [place].  Plan:  Continue to follow progress and disposition to assess for post hospital care management needs.  Will sign off at transition as needs are to be met at a skilled nursing facility level of care for disposition and no [facility] CM needs.  For questions contact:   [name], RN BSN [facility] Liaison  [phone] business mobile phone Toll free office [phone]  Fax number: [phone] [email or web address]

## 2021-04-21 NOTE — Consult Note (Addendum)
Cardiology Consultation:   Patient ID: Todd [name] MRN: [id number]; DOB: [birth year]  Admit date: 04/19/2021 Date of Consult: 04/21/2021  PCP:  [name], FNP   [facility] Providers Cardiologist: Dr. [name]  Patient Profile:   Todd [name] is a 65 y.o. male with a hx of PAD, mild cardiomyopathy, DM, CKD stage II, hypothyroidism with poor compliance, and polysubstance abuse (cocaine, alcohol, tobacco)  who is being seen 04/21/2021 for the evaluation of CHF at the request of Dr. Bonner Puna.  History of Present Illness:   [name] was seen by Dr. Bronson Ing in 07/27/18. He has a history of right BKA and left AKA in 2018. Echo in march 2018 showed LVEF 45-50% with hypokinesis of the basal-midinferior and basal-midanteroseptal myocardium. Per prior notes, presumed CAD. No definitive angiography due to CKD. Follow up echo with preserved EF.   He was hospitalized in July 11-15 2022 with weakness. He was using cocaine daily and in a wheelchair. He was treated for UTI. He was cocaine positive and TSH was 223 - compliance was questioned. He was discharged and readmitted 12/26/20 with a syncopal event found to be in Afib. He also had NSVT in the setting of electrolyte derangement (low phos, low Ca, Mg 1.1, and severely abnormal thyroid). He was placed on eliquis and metoprolol and discharged on 12/29/20. Echo at that time showed LVEF 55-60%, grade 1 DD, and no significant valvular disease. He was admitted again 01/10/21 for weakness, found down and covered in feces. Again with electrolyte derangements and Afib RVR. He was anemic with a heme positive stool. GI was consulted, endoscopy unrevealing, and eliquis resumed. He was discharged to SNF and discharged home after rehabilitation, but could not care for himself and admitted back to [facility].  He presented to ED 04/19/21 from SNF with SOB found to be hypoxic. SOB started between Sunday and Monday night. He was admitted for acute  respiratory failure with hypoxia secondary to CAP. BNP was elevated to 860. HS troponin mild and flat felt due to demand ischemia. Acute on chronic kidney injury noted on arrival.  Mg 1.6 --> 2.5 K 4.0 A1c 7.9% sCr 1.69 (1.89) -- previously normal  Albumin 2.4 WBC 17.5 --> 16.3 HS troponin 55 --> 56 BNP 859 COVID negative TSH pending  Echocardiogram yesterday showed LVEF 45-50%, moderate LVH, severely dilated left atrium. During my exam, he was resting comfortably. Lungs relatively clear, no JVD, and now edema on his lower extremity stumps. He denies angina and symptoms of heart failure.   Past Medical History:  Diagnosis Date   AKI (acute kidney injury) ([place])    Constipated    Diabetes mellitus without complication ([place])    Diarrhea    Elevated LFTs    Goiter    Gout    Hyperlipidemia    Hypertension    Leukocytosis    Reactive thrombocytosis    Right BKA infection ([place]) 08/2016   Right leg pain    Sepsis due to undetermined organism [facility])    Thyroid disease    Wound infection after surgery 08/2016    Past Surgical History:  Procedure Laterality Date   ABDOMINAL AORTOGRAM N/A 08/11/2016   Procedure: Abdominal Aortogram;  Surgeon: Waynetta Sandy, MD;  Location: [place] CV [facility];  Service: Cardiovascular;  Laterality: N/A;   ABDOMINAL AORTOGRAM W/LOWER EXTREMITY N/A 08/15/2016   Procedure: Abdominal Aortogram w/Lower Extremity;  Surgeon: Elam Dutch, MD;  Location: [place] [facility];  Service: Cardiovascular;  Laterality: N/A;  AMPUTATION Right 08/17/2016   Procedure: RIGHT BELOW KNEE AMPUTATION;  Surgeon: Elam Dutch, MD;  Location: [place];  Service: Vascular;  Laterality: Right;   AMPUTATION Right 09/12/2016   Procedure: AMPUTATION ABOVE KNEE;  Surgeon: Newt Minion, MD;  Location: [place];  Service: Orthopedics;  Laterality: Right;   AMPUTATION Left 08/12/2016   Procedure: LEFT BELOW KNEE AMPUTATION;  Surgeon: Newt Minion, MD;  Location: [place];  Service:  Orthopedics;  Laterality: Left;   AMPUTATION Left 11/01/2017   Procedure: LEFT ABOVE KNEE AMPUTATION;  Surgeon: Newt Minion, MD;  Location: [place];  Service: Orthopedics;  Laterality: Left;   APPLICATION OF WOUND VAC Right 09/12/2016   Procedure: APPLICATION OF WOUND VAC ABOVE KNEE;  Surgeon: Newt Minion, MD;  Location: [place];  Service: Orthopedics;  Laterality: Right;   BIOPSY  01/13/2021   Procedure: BIOPSY;  Surgeon: Rogene Houston, MD;  [facility];  Service: Endoscopy;;  esophageal   COLONOSCOPY WITH PROPOFOL N/A 01/13/2021   Procedure: COLONOSCOPY WITH PROPOFOL;  Surgeon: Rogene Houston, MD;  [facility];  Service: Endoscopy;  Laterality: N/A;   ESOPHAGOGASTRODUODENOSCOPY (EGD) WITH PROPOFOL N/A 01/13/2021   Procedure: ESOPHAGOGASTRODUODENOSCOPY (EGD) WITH PROPOFOL;  Surgeon: Rogene Houston, MD;  [facility];  Service: Endoscopy;  Laterality: N/A;   LOWER EXTREMITY ANGIOGRAPHY Bilateral 08/11/2016   Procedure: Lower Extremity Angiography;  Surgeon: Waynetta Sandy, MD;  Location: [place] [facility];  Service: Cardiovascular;  Laterality: Bilateral;   PERIPHERAL VASCULAR BALLOON ANGIOPLASTY Left 08/11/2016   Procedure: Peripheral Vascular Balloon Angioplasty;  Surgeon: Waynetta Sandy, MD;  Location: [place] [facility];  Service: Cardiovascular;  Laterality: Left;  SFA   POLYPECTOMY  01/13/2021   Procedure: POLYPECTOMY;  Surgeon: Rogene Houston, MD;  [facility];  Service: Endoscopy;;   THYROID SURGERY       Home Medications:  Prior to Admission medications   Medication Sig Start Date End Date Taking? Authorizing Provider  acetaminophen (TYLENOL) 325 MG tablet Take 2 tablets (650 mg total) by mouth every 6 (six) hours as needed for mild pain (or Fever >/= 101). 01/15/21  Yes Johnson, Clanford L, MD  allopurinol (ZYLOPRIM) 100 MG tablet Take 1 tablet (100 mg total) by mouth daily. Patient taking differently: Take 300 mg by  mouth daily. 12/06/20  Yes Johnson, Clanford L, MD  amLODipine (NORVASC) 5 MG tablet Take 1 tablet (5 mg total) by mouth daily. 01/15/21 01/15/22 Yes Johnson, Clanford L, MD  apixaban (ELIQUIS) 5 MG TABS tablet Take 1 tablet (5 mg total) by mouth 2 (two) times daily. 01/16/21  Yes Johnson, Clanford L, MD  atorvastatin (LIPITOR) 40 MG tablet TAKE 1 TABLET BY MOUTH ONCE DAILY AT 6 PM 12/06/20  Yes Johnson, Clanford L, MD  calcium carbonate (TUMS - DOSED IN MG ELEMENTAL CALCIUM) 500 MG chewable tablet Chew 2 tablets (400 mg of elemental calcium total) by mouth 2 (two) times daily with a meal. Patient taking differently: Chew 2 tablets by mouth 3 (three) times daily with meals. 01/15/21  Yes Johnson, Clanford L, MD  cetirizine (ZYRTEC) 10 MG tablet Take 1 tablet (10 mg total) by mouth daily as needed for allergies. For allergy symptoms 12/06/20  Yes Johnson, Clanford L, MD  cholecalciferol (VITAMIN D3) 25 MCG (1000 UNIT) tablet Take 1,000 Units by mouth daily.   Yes [provider]  folic acid (FOLVITE) 1 MG tablet Take 1 tablet (1 mg total)  by mouth daily. 12/29/20  Yes Emokpae, Courage, MD  glucose blood (ACCU-CHEK AVIVA PLUS) test strip Check blood sugars four times a day 03/12/18  Yes [name], [name], MD  insulin lispro (HUMALOG) 100 UNIT/ML KwikPen Inject 2-10 Units into the skin. Sliding scale 200-250 2units, 251-300 4units, 301-350 6units, 351-400 8units,401-450 10units 04/08/21  Yes [provider]  levothyroxine (SYNTHROID) 112 MCG tablet Take 1 tablet (112 mcg total) by mouth daily before breakfast. 02/25/21 02/25/22 Yes Reardon, Juanetta Beets, NP  LORazepam (ATIVAN) 0.5 MG tablet Take 1 tablet by mouth every 6 (six) hours as needed. 04/12/21  Yes [provider]  megestrol (MEGACE) 400 MG/10ML suspension Take 10 mLs (400 mg total) by mouth 2 (two) times daily. For appetite stimulation 12/06/20  Yes Johnson, Clanford L, MD  metoprolol succinate (TOPROL XL) 25 MG 24 hr tablet Take 1 tablet  (25 mg total) by mouth daily. 12/29/20 12/29/21 Yes Emokpae, Courage, MD  Multiple Vitamin (MULTIVITAMIN WITH MINERALS) TABS tablet Take 1 tablet by mouth daily. 01/16/21  Yes Johnson, Clanford L, MD  Nutritional Supplements (NUTRITIONAL SHAKE HIGH PROTEIN) LIQD Take 30 mLs by mouth daily.   Yes [provider]  pantoprazole (PROTONIX) 40 MG tablet Take 1 tablet (40 mg total) by mouth 2 (two) times daily. 01/15/21  Yes Johnson, Clanford L, MD  polyethylene glycol (MIRALAX / GLYCOLAX) 17 g packet Take 17 g by mouth daily. 01/16/21  Yes Johnson, Clanford L, MD  sertraline (ZOLOFT) 50 MG tablet Take 1 tablet (50 mg total) by mouth at bedtime. 12/06/20  Yes Johnson, Clanford L, MD  sodium bicarbonate 650 MG tablet Take 650 mg by mouth 3 (three) times daily.   Yes [provider]  thiamine 100 MG tablet Take 1 tablet (100 mg total) by mouth daily. 12/29/20  Yes Emokpae, Courage, MD  Nystatin (GERHARDT'S BUTT CREAM) CREA Apply 1 application topically 3 (three) times daily. Patient not taking: No sig reported 01/15/21   Irwin Brakeman L, MD  silver sulfADIAZINE (SILVADENE) 1 % cream Apply 1 application topically daily. Apply to buttock Patient not taking: No sig reported    [provider]    Inpatient Medications: Scheduled Meds:  amLODipine  5 mg Oral Daily   apixaban  5 mg Oral BID   atorvastatin  40 mg Oral Daily   dextromethorphan-guaiFENesin  1 tablet Oral BID   feeding supplement (GLUCERNA SHAKE)  237 mL Oral TID BM   folic acid  1 mg Oral Daily   insulin aspart  0-5 Units Subcutaneous QHS   insulin aspart  0-9 Units Subcutaneous TID WC   insulin aspart  3 Units Subcutaneous TID WC   insulin glargine-yfgn  7 Units Subcutaneous Daily   ipratropium-albuterol  3 mL Nebulization BID   levothyroxine  112 mcg Oral QAC breakfast   megestrol  400 mg Oral BID   [START ON 04/22/2021] metoprolol succinate  50 mg Oral Daily   metoprolol tartrate  25 mg Oral Once   multivitamin  with minerals  1 tablet Oral Daily   pantoprazole  40 mg Oral BID   thiamine  100 mg Oral Daily   Continuous Infusions:  sodium chloride Stopped (04/20/21 0916)   azithromycin 500 mg (04/21/21 0408)   cefTRIAXone (ROCEPHIN)  IV 1 g (04/21/21 0335)   PRN Meds: sodium chloride, acetaminophen, nicotine  Allergies:    Allergies  Allergen Reactions   Lisinopril Other (See Comments)    Hyperkalemia / Renal failure    Social History:  Social History   Socioeconomic History   Marital status: Single    Spouse name: Not on file   Number of children: Not on file   Years of education: Not on file   Highest education level: Not on file  Occupational History   Occupation: retired    Comment: drove a Medical illustrator  Tobacco Use   Smoking status: Every Day    Packs/day: 0.25    Years: 45.00    Pack years: 11.25    Types: Cigarettes    Last attempt to quit: 11/12/2014    Years since quitting: 6.4   Smokeless tobacco: Never  Vaping Use   Vaping Use: Never used  Substance and Sexual Activity   Alcohol use: Yes    Alcohol/week: 2.0 standard drinks    Types: 1 Glasses of wine, 1 Cans of beer per week   Drug use: No   Sexual activity: Not Currently  Other Topics Concern   Not on file  Social History Narrative   09/23/20 - Lives alone, uses a wheelchair, double amputee, not married, no children - HHA comes in 3x per week to help him bathe and set up weekly meds.   Social Determinants of Health   Financial Resource Strain: Low Risk    Difficulty of Paying Living Expenses: Not very hard  Food Insecurity: No Food Insecurity   Worried About Charity fundraiser in the Last Year: Never true   Ran Out of Food in the Last Year: Never true  Transportation Needs: No Transportation Needs   Lack of Transportation (Medical): No   Lack of Transportation (Non-Medical): No  Physical Activity: Inactive   Days of Exercise per Week: 0 days   Minutes of Exercise per Session: 0 min  Stress: No Stress  Concern Present   Feeling of Stress : Only a little  Social Connections: Socially Isolated   Frequency of Communication with Friends and Family: More than three times a week   Frequency of Social Gatherings with Friends and Family: More than three times a week   Attends Religious Services: Never   Marine scientist or Organizations: No   Attends Music therapist: Never   Marital Status: Never married  Human resources officer Violence: Not At Risk   Fear of Current or Ex-Partner: No   Emotionally Abused: No   Physically Abused: No   Sexually Abused: No    Family History:    Family History  Problem Relation Age of Onset   Heart disease Mother    Pneumonia Father    Diabetes Maternal Aunt    Diabetes Maternal Uncle      ROS:  Please see the history of present illness.   All other ROS reviewed and negative.     Physical Exam/Data:   Vitals:   04/21/21 0337 04/21/21 0742 04/21/21 1033 04/21/21 1134  BP: (!) 155/89 (!) 161/94  120/81  Pulse: 83 88 (!) 103 100  Resp: (!) 21 20 (!) 21 (!) 22  Temp: 98.2 F (36.8 C) 98.2 F (36.8 C)  98.2 F (36.8 C)  TempSrc: Oral Oral  Oral  SpO2: 100%  100%   Weight: 68.3 kg     Height:        Intake/Output Summary (Last 24 hours) at 04/21/2021 1541 Last data filed at 04/21/2021 1526 Gross per 24 hour  Intake 1109.51 ml  Output 350 ml  Net 759.51 ml   Last 3 Weights 04/21/2021 04/20/2021 04/19/2021  Weight (lbs) 150 lb  9.2 oz 150 lb 5.7 oz 130 lb 1.1 oz  Weight (kg) 68.3 kg 68.2 kg 59 kg     Body mass index is 22.89 kg/m.  General:  Well nourished, well developed, in no acute distress HEENT: normal Neck: no JVD Vascular: No carotid bruits; Distal pulses 2+ bilaterally Cardiac:  irregular rhythm regular rate, no mumur Lungs:  clear to auscultation bilaterally, no wheezing, rhonchi or rales  Abd: soft, nontender, no hepatomegaly  Ext: no edema Musculoskeletal:  No deformities, BUE and BLE strength normal and  equal Skin: warm and dry  Neuro:  CNs 2-12 intact, no focal abnormalities noted Psych:  Normal affect   EKG:  The EKG was personally reviewed and demonstrates:  appears atrial tachycardia, PACs Telemetry:  Telemetry was personally reviewed and demonstrates:  episodes of sinus, question wandering pacemaker with PACs,  episodes of Afib, HR in the 80s-90s  Relevant CV Studies:  Echo 04/20/21:  1. Left ventricular ejection fraction, by estimation, is 45 to 50%. The  left ventricle has mildly decreased function. There is moderate left  ventricular hypertrophy.   2. Right ventricular systolic function is normal. The right ventricular  size is normal.   3. Left atrial size was severely dilated.   4. Right atrial size was moderately dilated.   5. The inferior vena cava is normal in size with greater than 50%  respiratory variability, suggesting right atrial pressure of 3 mmHg.   6. Limited echo evaluate LV function.     Echo 12/27/20:  1. Left ventricular ejection fraction, by estimation, is 55 to 60%. The  left ventricle has normal function. The left ventricle has no regional  wall motion abnormalities. There is mild concentric left ventricular  hypertrophy. Left ventricular diastolic  parameters are consistent with Grade I diastolic dysfunction (impaired  relaxation).   2. Right ventricular systolic function is normal. The right ventricular  size is normal. Tricuspid regurgitation signal is inadequate for assessing  PA pressure.   3. The mitral valve is grossly normal. No evidence of mitral valve  regurgitation. No evidence of mitral stenosis.   4. The aortic valve is tricuspid. Aortic valve regurgitation is not  visualized. No aortic stenosis is present.   5. The inferior vena cava is normal in size with greater than 50%  respiratory variability, suggesting right atrial pressure of 3 mmHg.    Laboratory Data:  High Sensitivity Troponin:   Recent Labs  Lab 04/19/21 0019  04/20/21 0254 04/20/21 0502 04/20/21 0639  TROPONINIHS 52* 55* 55* 56*     Chemistry Recent Labs  Lab 04/19/21 0019 04/20/21 0254 04/20/21 1507 04/21/21 0327  NA 141  --  140  --   K 4.0  --  3.9  --   CL 115*  --  116*  --   CO2 18*  --  16*  --   GLUCOSE 174*  --  153*  --   BUN 26*  --  26*  --   CREATININE 1.89*  --  1.69*  --   CALCIUM 7.9*  --  7.9*  --   MG  --  1.6*  --  2.5*  GFRNONAA 39*  --  44*  --   ANIONGAP 8  --  8  --     Recent Labs  Lab 04/19/21 0019 04/20/21 1507  PROT 7.2 6.5  ALBUMIN 2.6* 2.4*  AST 19 17  ALT 21 18  ALKPHOS 136* 100  BILITOT 0.5 0.7   Lipids No  results for input(s): CHOL, TRIG, HDL, LABVLDL, LDLCALC, CHOLHDL in the last 168 hours.  Hematology Recent Labs  Lab 04/19/21 0019 2021/04/28 1507  WBC 17.5* 16.3*  RBC 3.94* 3.73*  HGB 11.3* 10.7*  HCT 35.3* 32.4*  MCV 89.6 86.9  MCH 28.7 28.7  MCHC 32.0 33.0  RDW 16.7* 16.4*  PLT 725* 657*   Thyroid No results for input(s): TSH, FREET4 in the last 168 hours.  BNP Recent Labs  Lab 04/19/21 0019  BNP 859.0*    DDimer No results for input(s): DDIMER in the last 168 hours.   Radiology/Studies:  US RENAL  Result Date: 04-28-21 CLINICAL DATA:  Acute kidney injury EXAM: RENAL / URINARY TRACT ULTRASOUND COMPLETE COMPARISON:  10/26/2017 FINDINGS: Right Kidney: Renal measurements: 11.8 x 5.7 x 6.4 cm = volume: 226.4 mL. Echogenicity within normal limits. No mass or hydronephrosis visualized. Trace right perinephric fluid. Left Kidney: Renal measurements: 11.6 x 6.2 x 6.4 cm = volume: 243.1 mL. Echogenicity within normal limits. No mass or hydronephrosis visualized. Bladder: Appears normal for degree of bladder distention. Other: None. IMPRESSION: Negative examination. Electronically Signed   By: Donavan Foil M.D.   On: Apr 28, 2021 15:42   DG Chest Port 1 View  Result Date: 04/28/21 CLINICAL DATA:  Shortness of breath EXAM: PORTABLE CHEST 1 VIEW COMPARISON:  01/10/2021,  12/04/2020 FINDINGS: Normal cardiac size. Mild diffuse interstitial thickening with mild ground-glass opacity in the right thorax. No pleural effusion or pneumothorax. Aortic atherosclerosis. Postsurgical changes at the thoracic inlet. IMPRESSION: 1. Mild ground-glass opacity in the right thorax, potentially due to pneumonia. 2. Mild background chronic bronchitic change Electronically Signed   By: Donavan Foil M.D.   On: April 28, 2021 00:05   ECHOCARDIOGRAM LIMITED  Result Date: Apr 28, 2021    ECHOCARDIOGRAM LIMITED REPORT   Patient Name:   NNAMDI DACUS Date of Exam: 2021/04/28 Medical Rec #:  [id number]       Height:       68.0 in Accession #:    [id number]      Weight:       130.1 lb Date of Birth:  1955-07-10      BSA:          1.702 m Patient Age:    25 years        BP:           154/81 mmHg Patient Gender: M               HR:           87 bpm. Exam Location:  [facility] Procedure: Limited Echo and Intracardiac Opacification Agent Indications:    CHF-Acute Diastolic  History:        Patient has prior history of Echocardiogram examinations, most                 recent 12/27/2020. CHF, Arrythmias:Atrial Fibrillation,                 Signs/Symptoms:Syncope; Risk Factors:Current Smoker,                 Hypertension, Diabetes and Dyslipidemia. Cocaine and Alcohol                 abuse.  Sonographer:    Wenda Low Referring Phys: [id number] OLADAPO ADEFESO  Sonographer Comments: Technically challenging study due to limited acoustic windows. IMPRESSIONS  1. Left ventricular ejection fraction, by estimation, is 45 to 50%. The left ventricle has mildly decreased function. There is moderate left  ventricular hypertrophy.  2. Right ventricular systolic function is normal. The right ventricular size is normal.  3. Left atrial size was severely dilated.  4. Right atrial size was moderately dilated.  5. The inferior vena cava is normal in size with greater than 50% respiratory variability, suggesting right atrial pressure  of 3 mmHg.  6. Limited echo evaluate LV function. FINDINGS  Left Ventricle: Left ventricular ejection fraction, by estimation, is 45 to 50%. The left ventricle has mildly decreased function. Definity contrast agent was given IV to delineate the left ventricular endocardial borders. There is moderate left ventricular hypertrophy. Right Ventricle: The right ventricular size is normal. Right vetricular wall thickness was not well visualized. Right ventricular systolic function is normal. Left Atrium: Left atrial size was severely dilated. Right Atrium: Right atrial size was moderately dilated. Pericardium: There is no evidence of pericardial effusion. Venous: The inferior vena cava is normal in size with greater than 50% respiratory variability, suggesting right atrial pressure of 3 mmHg. LEFT VENTRICLE PLAX 2D LVIDd:         4.90 cm LVIDs:         4.10 cm LV PW:         1.30 cm LV IVS:        1.30 cm LVOT diam:     2.00 cm LVOT Area:     3.14 cm  RIGHT VENTRICLE RV Basal diam:  3.60 cm RV Mid diam:    2.90 cm TAPSE (M-mode): 2.3 cm LEFT ATRIUM              Index        RIGHT ATRIUM           Index LA diam:        4.10 cm  2.41 cm/m   RA Area:     20.70 cm LA Vol (A2C):   127.0 ml 74.61 ml/m  RA Volume:   63.10 ml  37.07 ml/m LA Vol (A4C):   65.8 ml  38.66 ml/m LA Biplane Vol: 96.6 ml  56.75 ml/m   SHUNTS Systemic Diam: 2.00 cm Carlyle Dolly MD Electronically signed by Carlyle Dolly MD Signature Date/Time: 04/20/2021/11:20:36 AM    Final      Assessment and Plan:   Acute on chronic systolic heart failure - BNP 859 - echo with LVEF 45-50% with mild DD - he does not appear volume up on exam - has not received diuretic  - continue toprol, D/C amlodipine - start bidil and farxiga - in the absence of angina and with CKD, will  not pursue ischemic eval at this time, we can follow outpatient   PAF / atrial tachycardia Chronic anticoagulation - now compliant in SNF - rate controlled with lopressor  --> increase by primary - anticoagulated with eliquis - echo with severely dilated left atrium - telemetry with episodes of atrial tachycardia    PAD Hyperlipidemia - bilateral lower extremity amputations - continue statin   DM - A1c 7.9%, will start farxiga   Polysubstance abuse Hypothyroidism - per primary - compliance no longer an issue in the facility   Risk Assessment/Risk Scores:     CHA2DS2-VASc Score = 5   This indicates a 7.2% annual risk of stroke. The patient's score is based upon: CHF History: 1 HTN History: 1 Diabetes History: 1 Stroke History: 0 Vascular Disease History: 1 Age Score: 1 Gender Score: 0      For questions or updates, please contact [facility] Please consult [email or web address] for  contact info under    Signed, Ledora Bottcher, PA  04/21/2021 3:41 PM  History and all data above reviewed.  Patient examined.  I agree with the findings as above.  The patient presents with acute shortness of breath.  He has recently been living in a skilled facility.  He became acutely short of breath while there.  He apparently gets around with his wheelchair.  He can transport from his bed to his chair.  Status post bilateral amputations.  He has not been getting any chest pressure, neck or arm discomfort.  Has not been having any new shortness of breath, PND or orthopnea.  He has no palpitations, presyncope or syncope.  He has had no weight gain or edema.  He can transport himself from his bed to the patient exam reveals COR:RRR  ,  Lungs: Clear  ,  Abd: Positive bowel sounds, no rebound no guarding, Ext No chest pain.   .  All available labs, radiology testing, previous records reviewed. Agree with documented assessment and plan.   Acute SOB: I think there is likely some component of acute on chronic systolic and diastolic heart failure.  His ejection fraction does appear to be slightly worse.  However, given his renal insufficiency and fact that he is feeling  well now I would not continue with further diuresis.  Rather we will treat his reduced ejection fraction with med changes as below.  Cardiomyopathy: This may well be ischemic.  However, he is high risk for cath and probably do better with medical management.  We will start with reassessment of his EF.   Towards that end we will get rid of the Norvasc and start hydralazine nitrates and we will titrate his beta blocker.  Arrhythmia:   There is some evidence of atrial fibrillation though much of what he is having is premature atrial tachycardia with multifocal atrial arrhythmia.  He will continue on anticoagulation.  We will continue medical management with increased beta-blocker.  Had a conversation with the patient and his sisters.Jeneen Rinks Sandrina Heaton  5:16 PM  04/21/2021

## 2021-04-21 NOTE — Progress Notes (Signed)
Inpatient Diabetes Program Recommendations  AACE/ADA: New Consensus Statement on Inpatient Glycemic Control (2015)  Target Ranges:  Prepandial:   less than 140 mg/dL      Peak postprandial:   less than 180 mg/dL (1-2 hours)      Critically ill patients:  140 - 180 mg/dL   Lab Results  Component Value Date   GLUCAP 348 (H) 04/21/2021   HGBA1C 7.9 (H) 04/20/2021    Review of Glycemic Control Results for Todd [name], USERY (MRN [id number]) as of 04/21/2021 11:31  Ref. Range 04/20/2021 07:10 04/20/2021 11:16 04/20/2021 16:34 04/20/2021 20:36 04/21/2021 07:40  Glucose-Capillary Latest Ref Range: 70 - 99 mg/dL 133 (H) 146 (H) 166 (H)  Solumedrol 40 mg x2 doses given  Novolog 2 units given  243 (H) 348 (H)   Diabetes history: DM 2 Outpatient Diabetes medications: Humalog 2-10 units tid Current orders for Inpatient glycemic control:  Novolog 0-9 units tid + hs  Solumedrol 40 mg Daily  Inpatient Diabetes Program Recommendations:    Note: Glucose  348 this am after steroid dose.  -  Start Semglee 8 units -  Add Novolog 2 units tid meal coverage if eating >50% of meals  Thanks,  Tama Headings RN, MSN, BC-ADM Inpatient Diabetes Coordinator Team Pager [phone] (8a-5p)

## 2021-04-22 ENCOUNTER — Inpatient Hospital Stay (HOSPITAL_COMMUNITY): Payer: Medicare HMO

## 2021-04-22 DIAGNOSIS — J9601 Acute respiratory failure with hypoxia: Secondary | ICD-10-CM | POA: Diagnosis not present

## 2021-04-22 DIAGNOSIS — I480 Paroxysmal atrial fibrillation: Secondary | ICD-10-CM

## 2021-04-22 DIAGNOSIS — E039 Hypothyroidism, unspecified: Secondary | ICD-10-CM | POA: Diagnosis not present

## 2021-04-22 DIAGNOSIS — N179 Acute kidney failure, unspecified: Secondary | ICD-10-CM | POA: Diagnosis not present

## 2021-04-22 DIAGNOSIS — J189 Pneumonia, unspecified organism: Secondary | ICD-10-CM | POA: Diagnosis not present

## 2021-04-22 LAB — COMPREHENSIVE METABOLIC PANEL
ALT: 17 U/L (ref 0–44)
AST: 16 U/L (ref 15–41)
Albumin: 2.4 g/dL — ABNORMAL LOW (ref 3.5–5.0)
Alkaline Phosphatase: 124 U/L (ref 38–126)
Anion gap: 11 (ref 5–15)
BUN: 43 mg/dL — ABNORMAL HIGH (ref 8–23)
CO2: 14 mmol/L — ABNORMAL LOW (ref 22–32)
Calcium: 7.9 mg/dL — ABNORMAL LOW (ref 8.9–10.3)
Chloride: 111 mmol/L (ref 98–111)
Creatinine, Ser: 2.25 mg/dL — ABNORMAL HIGH (ref 0.61–1.24)
GFR, Estimated: 32 mL/min — ABNORMAL LOW (ref >60)
Glucose, Bld: 384 mg/dL — ABNORMAL HIGH (ref 70–99)
Potassium: 4.6 mmol/L (ref 3.5–5.1)
Sodium: 136 mmol/L (ref 135–145)
Total Bilirubin: 0.5 mg/dL (ref 0.3–1.2)
Total Protein: 6.7 g/dL (ref 6.5–8.1)

## 2021-04-22 LAB — CBC
HCT: 32.1 % — ABNORMAL LOW (ref 39.0–52.0)
Hemoglobin: 10.1 g/dL — ABNORMAL LOW (ref 13.0–17.0)
MCH: 27.6 pg (ref 26.0–34.0)
MCHC: 31.5 g/dL (ref 30.0–36.0)
MCV: 87.7 fL (ref 80.0–100.0)
Platelets: 677 10*3/uL — ABNORMAL HIGH (ref 150–400)
RBC: 3.66 MIL/uL — ABNORMAL LOW (ref 4.22–5.81)
RDW: 16.3 % — ABNORMAL HIGH (ref 11.5–15.5)
WBC: 22.1 10*3/uL — ABNORMAL HIGH (ref 4.0–10.5)
nRBC: 0 % (ref 0.0–0.2)

## 2021-04-22 LAB — BLOOD GAS, ARTERIAL
Acid-base deficit: 10.7 mmol/L — ABNORMAL HIGH (ref 0.0–2.0)
Bicarbonate: 13.3 mmol/L — ABNORMAL LOW (ref 20.0–28.0)
FIO2: 100
O2 Saturation: 90.7 %
Patient temperature: 36.9
pCO2 arterial: 22.4 mmHg — ABNORMAL LOW (ref 32.0–48.0)
pH, Arterial: 7.391 (ref 7.350–7.450)
pO2, Arterial: 62.7 mmHg — ABNORMAL LOW (ref 83.0–108.0)

## 2021-04-22 LAB — GLUCOSE, CAPILLARY
Glucose-Capillary: 276 mg/dL — ABNORMAL HIGH (ref 70–99)
Glucose-Capillary: 313 mg/dL — ABNORMAL HIGH (ref 70–99)
Glucose-Capillary: 77 mg/dL (ref 70–99)
Glucose-Capillary: 118 mg/dL — ABNORMAL HIGH (ref 70–99)

## 2021-04-22 LAB — PROCALCITONIN: Procalcitonin: 0.13 ng/mL

## 2021-04-22 LAB — MRSA NEXT GEN BY PCR, NASAL: MRSA by PCR Next Gen: NOT DETECTED

## 2021-04-22 LAB — TSH: TSH: 0.315 u[IU]/mL — ABNORMAL LOW (ref 0.350–4.500)

## 2021-04-22 LAB — T4, FREE: Free T4: 1.12 ng/dL (ref 0.61–1.12)

## 2021-04-22 MED ORDER — SODIUM CHLORIDE 0.9 % IV SOLN: 2.0000 g | Freq: Two times a day (BID) | INTRAVENOUS | Status: DC

## 2021-04-22 MED ORDER — ISOSORB DINITRATE-HYDRALAZINE 20-37.5 MG PO TABS
1.0000 | ORAL_TABLET | Freq: Three times a day (TID) | ORAL | Status: DC
  Administered 2021-04-22 – 2021-04-29 (×19): 1 via ORAL
  Filled 2021-04-22 (×20): qty 1

## 2021-04-22 MED ORDER — IPRATROPIUM-ALBUTEROL 0.5-2.5 (3) MG/3ML IN SOLN
3.0000 mL | RESPIRATORY_TRACT | Status: DC | PRN
  Administered 2021-04-22 – 2021-05-05 (×7): 3 mL via RESPIRATORY_TRACT
  Filled 2021-04-22 (×8): qty 3

## 2021-04-22 MED ORDER — SODIUM CHLORIDE 0.9 % IV SOLN: 2.0000 g | Freq: Once | INTRAVENOUS | Status: DC

## 2021-04-22 MED ORDER — INSULIN GLARGINE-YFGN 100 UNIT/ML ~~LOC~~ SOLN
15.0000 [IU] | Freq: Every day | SUBCUTANEOUS | Status: DC
Start: 2021-04-22 — End: 2021-04-25
  Administered 2021-04-22 – 2021-04-24 (×3): 15 [IU] via SUBCUTANEOUS
  Filled 2021-04-22 (×4): qty 0.15

## 2021-04-22 MED ORDER — PIPERACILLIN-TAZOBACTAM 3.375 G IVPB
3.3750 g | Freq: Three times a day (TID) | INTRAVENOUS | Status: DC
  Administered 2021-04-22 – 2021-04-29 (×19): 3.375 g via INTRAVENOUS
  Filled 2021-04-22 (×20): qty 50

## 2021-04-22 MED ORDER — PIPERACILLIN-TAZOBACTAM 3.375 G IVPB 30 MIN
3.3750 g | Freq: Once | INTRAVENOUS | Status: AC
  Administered 2021-04-22: 3.375 g via INTRAVENOUS
  Filled 2021-04-22: qty 50

## 2021-04-22 MED ORDER — SODIUM BICARBONATE 650 MG PO TABS
650.0000 mg | ORAL_TABLET | Freq: Three times a day (TID) | ORAL | Status: DC
  Administered 2021-04-22 – 2021-04-24 (×6): 650 mg via ORAL
  Filled 2021-04-22 (×6): qty 1

## 2021-04-22 MED ORDER — VANCOMYCIN VARIABLE DOSE PER UNSTABLE RENAL FUNCTION (PHARMACIST DOSING): Status: DC

## 2021-04-22 MED ORDER — VANCOMYCIN HCL 1500 MG/300ML IV SOLN
1500.0000 mg | Freq: Once | INTRAVENOUS | Status: AC
  Administered 2021-04-22: 1500 mg via INTRAVENOUS
  Filled 2021-04-22: qty 300

## 2021-04-22 NOTE — Progress Notes (Signed)
X-cover note Repeated calls for SOB/dyspnea. 40 mg IV lasix given with good urine output. Labs from this AM show increase in Scr to 2.2.  Now on 4 L/min supplemental O2.  RT giving nebs. Pt still SOB.  Will check CXR and ABG.  May need transfer to higher level of care.

## 2021-04-22 NOTE — Progress Notes (Signed)
Pharmacy Antibiotic Note  Todd [name] is a 65 y.o. male admitted on 04/19/2021 with CAP, now w/ worsening pneumonia despite CAP coverage with Rocephin and azithromycin.  Pharmacy has been consulted for vancomycin and Zosyn dosing.  Plan: Vancomycin 1500mg  IV x1; monitor SCr +/- levels prior to redosing. Zosyn 3.375g IV q8h (4 hour infusion).  Height: 5\' 8"  (172.7 cm) Weight: 70 kg (154 lb 6.4 oz) IBW/kg (Calculated) : 68.4  Temp (24hrs), Avg:98.4 F (36.9 C), Min:98.2 F (36.8 C), Max:98.6 F (37 C)  Recent Labs  Lab 04/19/21 0019 04/20/21 0052 04/20/21 1507 04/22/21 0207  WBC 17.5*  --  16.3* 22.1*  CREATININE 1.89*  --  1.69* 2.25*  LATICACIDVEN  --  1.7  --   --     Estimated Creatinine Clearance: 31.7 mL/min (A) (by C-G formula based on SCr of 2.25 mg/dL (H)).    Allergies  Allergen Reactions   Lisinopril Other (See Comments)    Hyperkalemia / Renal failure    Thank you for allowing pharmacy to be a part of this patient's care.  Wynona Neat, PharmD, BCPS  04/22/2021 6:41 AM

## 2021-04-22 NOTE — Progress Notes (Signed)
2 episodes of SOB/dyspnea this shift when patient was semi-flat in bed. First occurrence was able to recover in a few minutes after sitting up and placing 4L O2 via [place]. HR jumped to 130 and RR in 30s. O2 sat dropped to mid 80s but recovered to 100 with the 4L. Provider paged and 40mg  IV lasix given with 632mL UOP following. Second occurrence was more extensive. Repeat sitting patient up and 4L O2. Sats dropped to low 80s and only recovered to upper 80s on 4L. Provider paged again. Neb treatment added; respiratory was called to administer. Additionally needing non-rebreather 83mL. Provider was paged again as O2 sat only in low 90s but climbing and patient slowly improving. Provider noted serum creatinine already jumped up to 2.2, so unable to do lasix. ABG and CXR ordered. Patient now back to 4L [place] sating upper 90s.   Additionally, patient had 18 beat aberrant run and several beat run of SVT during this shift.  Will continue to monitor and assess need for additional intervention.

## 2021-04-22 NOTE — Progress Notes (Signed)
PROGRESS NOTE  Todd [name]  DQQ:[id number] DOB: [birth year] DOA: 04/19/2021 PCP: [name], FNP  Brief Narrative: Todd [name] is a 65 y.o. male recently admitted from 01/10/2021 to 01/15/2021 due to chronic A. fib with RVR, with medical history significant for essential hypertension, hypothyroidism, type 2 diabetes mellitus, chronic atrial fibrillation, status post bilateral AKA, history of polysubstance abuse (alcohol, tobacco and cocaine) who presents to [facility] ED from [facility] via EMS due to shortness of breath.  He was noted to be hypoxic, in the 80% on room air at the SNF, neb treatment was provided and patient was placed on a supplemental oxygen via [place] at 2 LPM with minimal improvement.  EMS was activated and on arrival, O2 supplementation was increased to 4 LPM en route to the hospital.   Work-up revealed acute hypoxic respiratory failure, right-sided community-acquired pneumonia, mild pulmonary edema, elevated troponin, elevated BNP greater than 800, acute on chronic diastolic CHF, AKI, non-anion gap metabolic acidosis, and electrolyte abnormalities.  Echocardiogram revealed LV systolic dysfunction and monitoring detected RVR. Lasix was given with subsequently increasing creatinine. Antibiotics were continued, and broadened with worsening hypoxia and progressive R > L CXR infiltrates.   Assessment & Plan: Principal Problem:   CAP (community acquired pneumonia) Active Problems:   Essential hypertension   Acquired hypothyroidism   Elevated troponin   Acute respiratory failure with hypoxia (HCC)   Hyperglycemia due to diabetes mellitus (HCC)   Leukocytosis   Reactive thrombocytosis   S/P AKA (above knee amputation) bilateral (HCC)   AKI (acute kidney injury) ([place])   Atrial fibrillation, chronic (HCC)   Hypomagnesemia   Elevated brain natriuretic peptide (BNP) level   Polysubstance abuse (HCC)   Noncompliance with medication regimen   Chronic diastolic CHF  (congestive heart failure) (HCC)   Hypoalbuminemia due to protein-calorie malnutrition (HCC)   GERD (gastroesophageal reflux disease)  Acute hypoxic respiratory failure: Presumed to be due to CHF and right-sided pneumonia, COPD exacerbation also possible given previous reports of wheezing. Bronchitic changes on CXR with hazy ground glass appearance of right lung. Opacity on right advanced considerably on repeat 11/10.  - Transfer to PCU level of care with continuous pulse oximetry and cardiac monitoring.  - Treat conditions as below, wean from oxygen as able.   PAF, atrial tachycardia: Recent admit for RVR.  - Continue metoprolol succinate - Continue eliquis. Hgb roughly stable - Continue telemetry which has shown intermittent rapid rates and limited NSVT which was asymptomatic.  - Maintain K >4, Mg >2  Acute on chronic HFrEF, HTN: LVEF 45-50% without RWMA, +LVH, normal IVC. Severe LAE, moderate RAE. BNP elevated.  - Cardiology consulted. Unable to continue diuresis/start SGLT2 with creatinine bump - Converted norvasc to bidil. Continue metoprolol succinate.   AKI, worsening with NAGMA: No abnormality on U/S, though did have trace perinephric fluid on right.  - Minimize duration of vancomycin as able, avoid contrast, optimize volume status.  - Start bicarbonate tabs  Demand myocardial ischemia: Mild troponin elevation with reassuring delta no anginal complaints.  - Defer w/u to cardiology  Right-sided CAP: Progressive infiltrates with hypoxia overnight into 11/10.  - WBC up (though attributable to steroids) but PCT remains reassuring, though abx coverage was expended and remains appropriately so until clinical stability achieved.  - Continue vancomycin (check MRSA PCR) due to rapid infiltrate progression, healthcare environmental exposure, and zosyn (changed from CTX), and azithromycin for atypical coverage.  - Monitor culture data.   COPD with exacerbation:  - Continue nebs.  Given  resolution of wheezing and severe steroid-induced hyperglycemia, steroids stopped. Exam continues to be inconsistent with bronchospasm.   Uncontrolled T2DM with steroid-induced hyperglycemia: HbA1c 7.9%.  - Increase glargine insulin to 15u, give 3u TIDWC + SSI.    Polysubstance abuse: Previously +alcohol, tobacco, cocaine.  - Cessation counseling. Monitor for withdrawal, continue nicotine patch.   Hypothyroidism:  - Repeat TSH, last was 20.52 in September 2022 which was improved from 189 in July in setting of medication nonadherence. Now TSH possibly oversuppressed with level 0.3. Check free T4, consider decreasing synthroid dose 137mcg > 174mcg.  - Continue synthroid  Anemia of chronic disease:  - Monitor CBC in AM.   HTN: Tx as above  PVD s/p bilateral AKA (2018-2019):  - Continue statin, anticoagulation  GERD:  - Continue PPI  DVT prophylaxis: Eliquis Code Status: Full Family Communication: None at bedside Disposition Plan:  Status is: Inpatient  Remains inpatient appropriate because: requires treatment for acute CHF and pneumonia.   Consultants:  Cardiology  Procedures:  Echocardiogram  Antimicrobials: Ceftriaxone, azithromycin > vancomycin, zosyn.  Subjective: Had episodes of dyspnea overnight requiring increased oxygen supplementation, ABG confirmed hypoxemia and infiltrate advanced on CXR. Pt, however, when examined a few hours afterward reports his breathing feels fine, he has no chest or other pain, no palpitations.   Objective: Vitals:   04/22/21 0806 04/22/21 0913 04/22/21 1126 04/22/21 1551  BP: (!) 144/97 138/79 (!) 140/93 (!) 144/82  Pulse:  70 86 86  Resp: (!) 23 18 (!) 22 20  Temp:      TempSrc:      SpO2:  99% 97% 100%  Weight:      Height:        Intake/Output Summary (Last 24 hours) at 04/22/2021 1555 Last data filed at 04/22/2021 1500 Gross per 24 hour  Intake 0 ml  Output 1175 ml  Net -1175 ml   Filed Weights   04/20/21 1033  04/21/21 0337 04/22/21 0358  Weight: 68.2 kg 68.3 kg 70 kg   Gen: Chronically ill-appearing male in no distress Pulm: Nonlabored but tachypneic. No wheezes or crackles. CV: Regular borderline tachycardia without murmur, rub, or gallop. No JVD, no dependent edema at hips. GI: Abdomen soft, non-tender, non-distended, with normoactive bowel sounds.  Ext: Warm, LE AKAs Skin: No new rashes, lesions or ulcers on visualized skin. Neuro: Alert and oriented. No focal neurological deficits. Psych: Judgement and insight appear fair. Mood euthymic & affect congruent. Behavior is appropriate.    Data Reviewed: I have personally reviewed following labs and imaging studies  CBC: Recent Labs  Lab 04/19/21 0019 04/20/21 1507 04/22/21 0207  WBC 17.5* 16.3* 22.1*  NEUTROABS 13.6*  --   --   HGB 11.3* 10.7* 10.1*  HCT 35.3* 32.4* 32.1*  MCV 89.6 86.9 87.7  PLT 725* 657* 397*   Basic Metabolic Panel: Recent Labs  Lab 04/19/21 0019 04/20/21 0254 04/20/21 1507 04/21/21 0327 04/22/21 0207  NA 141  --  140  --  136  K 4.0  --  3.9  --  4.6  CL 115*  --  116*  --  111  CO2 18*  --  16*  --  14*  GLUCOSE 174*  --  153*  --  384*  BUN 26*  --  26*  --  43*  CREATININE 1.89*  --  1.69*  --  2.25*  CALCIUM 7.9*  --  7.9*  --  7.9*  MG  --  1.6*  --  2.5*  --   PHOS  --  3.8  --  5.1*  --    GFR: Estimated Creatinine Clearance: 31.7 mL/min (A) (by C-G formula based on SCr of 2.25 mg/dL (H)). Liver Function Tests: Recent Labs  Lab 04/19/21 0019 04/20/21 1507 04/22/21 0207  AST 19 17 16   ALT 21 18 17   ALKPHOS 136* 100 124  BILITOT 0.5 0.7 0.5  PROT 7.2 6.5 6.7  ALBUMIN 2.6* 2.4* 2.4*   No results for input(s): LIPASE, AMYLASE in the last 168 hours. No results for input(s): AMMONIA in the last 168 hours. Coagulation Profile: Recent Labs  Lab 04/20/21 0254  INR 1.4*   Cardiac Enzymes: No results for input(s): CKTOTAL, CKMB, CKMBINDEX, TROPONINI in the last 168 hours. BNP (last 3  results) No results for input(s): PROBNP in the last 8760 hours. HbA1C: Recent Labs    04/20/21 1507  HGBA1C 7.9*   CBG: Recent Labs  Lab 04/21/21 1619 04/21/21 2137 04/21/21 2301 04/22/21 0737 04/22/21 1124  GLUCAP 409* 410* 361* 276* 313*   Lipid Profile: No results for input(s): CHOL, HDL, LDLCALC, TRIG, CHOLHDL, LDLDIRECT in the last 72 hours. Thyroid Function Tests: Recent Labs    04/22/21 0207  TSH 0.315*   Anemia Panel: No results for input(s): VITAMINB12, FOLATE, FERRITIN, TIBC, IRON, RETICCTPCT in the last 72 hours. Urine analysis:    Component Value Date/Time   COLORURINE AMBER (A) 01/11/2021 1030   APPEARANCEUR CLOUDY (A) 01/11/2021 1030   APPEARANCEUR Clear 02/09/2018 1352   LABSPEC 1.017 01/11/2021 1030   PHURINE 5.0 01/11/2021 1030   GLUCOSEU NEGATIVE 01/11/2021 1030   HGBUR SMALL (A) 01/11/2021 1030   BILIRUBINUR NEGATIVE 01/11/2021 1030   BILIRUBINUR Negative 02/09/2018 1352   KETONESUR NEGATIVE 01/11/2021 1030   PROTEINUR 30 (A) 01/11/2021 1030   NITRITE NEGATIVE 01/11/2021 1030   LEUKOCYTESUR MODERATE (A) 01/11/2021 1030   Recent Results (from the past 240 hour(s))  Resp Panel by RT-PCR (Flu A&B, Covid) Nasopharyngeal Swab     Status: None   Collection Time: 04/19/21 12:12 AM   Specimen: Nasopharyngeal Swab; Nasopharyngeal(NP) swabs in vial transport medium  Result Value Ref Range Status   SARS Coronavirus 2 by RT PCR NEGATIVE NEGATIVE Final    Comment: (NOTE) SARS-CoV-2 target nucleic acids are NOT DETECTED.  The SARS-CoV-2 RNA is generally detectable in upper respiratory specimens during the acute phase of infection. The lowest concentration of SARS-CoV-2 viral copies this assay can detect is 138 copies/mL. A negative result does not preclude SARS-Cov-2 infection and should not be used as the sole basis for treatment or other patient management decisions. A negative result may occur with  improper specimen collection/handling, submission  of specimen other than nasopharyngeal swab, presence of viral mutation(s) within the areas targeted by this assay, and inadequate number of viral copies(<138 copies/mL). A negative result must be combined with clinical observations, patient history, and epidemiological information. The expected result is Negative.  Fact Sheet for Patients:  EntrepreneurPulse.com.au  Fact Sheet for Healthcare Providers:  IncredibleEmployment.be  This test is no t yet approved or cleared by the [place] FDA and  has been authorized for detection and/or diagnosis of SARS-CoV-2 by FDA under an Emergency Use Authorization (EUA). This EUA will remain  in effect (meaning this test can be used) for the duration of the COVID-19 declaration under Section 564(b)(1) of the Act, 21 U.S.C.section 360bbb-3(b)(1), unless the authorization is terminated  or revoked sooner.       Influenza A by  PCR NEGATIVE NEGATIVE Final   Influenza B by PCR NEGATIVE NEGATIVE Final    Comment: (NOTE) The Xpert Xpress SARS-CoV-2/FLU/RSV plus assay is intended as an aid in the diagnosis of influenza from Nasopharyngeal swab specimens and should not be used as a sole basis for treatment. Nasal washings and aspirates are unacceptable for Xpert Xpress SARS-CoV-2/FLU/RSV testing.  Fact Sheet for Patients: EntrepreneurPulse.com.au  Fact Sheet for Healthcare Providers: IncredibleEmployment.be  This test is not yet approved or cleared by the [place] FDA and has been authorized for detection and/or diagnosis of SARS-CoV-2 by FDA under an Emergency Use Authorization (EUA). This EUA will remain in effect (meaning this test can be used) for the duration of the COVID-19 declaration under Section 564(b)(1) of the Act, 21 U.S.C. section 360bbb-3(b)(1), unless the authorization is terminated or revoked.  Performed at [facility], [address],  [place], [place] [address]   Culture, blood (Routine X 2) w Reflex to ID Panel     Status: None (Preliminary result)   Collection Time: 04/20/21 12:52 AM   Specimen: BLOOD  Result Value Ref Range Status   Specimen Description BLOOD LEFT ANTECUBITAL  Final   Special Requests   Final    BOTTLES DRAWN AEROBIC AND ANAEROBIC Blood Culture adequate volume   Culture   Final    NO GROWTH 2 DAYS Performed at [facility], [address], [place], [place] [address]    Report Status PENDING  Incomplete  Culture, blood (Routine X 2) w Reflex to ID Panel     Status: None (Preliminary result)   Collection Time: 04/20/21 12:52 AM   Specimen: BLOOD  Result Value Ref Range Status   Specimen Description BLOOD BLOOD LEFT HAND  Final   Special Requests   Final    BOTTLES DRAWN AEROBIC AND ANAEROBIC Blood Culture adequate volume   Culture   Final    NO GROWTH 2 DAYS Performed at [facility], [address], [place], [place] [address]    Report Status PENDING  Incomplete      Radiology Studies: DG CHEST PORT 1 VIEW  Result Date: 04/22/2021 CLINICAL DATA:  Shortness of breath EXAM: PORTABLE CHEST 1 VIEW COMPARISON:  Three days ago FINDINGS: Severe airspace disease asymmetric to the right. Borderline heart size. No visible effusion or pneumothorax. IMPRESSION: Extensive and worsened airspace disease. Electronically Signed   By: Jorje Guild M.D.   On: 04/22/2021 05:52    Scheduled Meds:  apixaban  5 mg Oral BID   atorvastatin  40 mg Oral Daily   dextromethorphan-guaiFENesin  1 tablet Oral BID   feeding supplement (GLUCERNA SHAKE)  237 mL Oral TID BM   folic acid  1 mg Oral Daily   insulin aspart  0-5 Units Subcutaneous QHS   insulin aspart  0-9 Units Subcutaneous TID WC   insulin aspart  3 Units Subcutaneous TID WC   insulin glargine-yfgn  15 Units Subcutaneous Daily   ipratropium-albuterol  3 mL Nebulization BID   isosorbide-hydrALAZINE  1 tablet Oral TID   levothyroxine  112 mcg Oral QAC  breakfast   megestrol  400 mg Oral BID   metoprolol succinate  50 mg Oral Daily   multivitamin with minerals  1 tablet Oral Daily   pantoprazole  40 mg Oral BID   thiamine  100 mg Oral Daily   vancomycin variable dose per unstable renal function (pharmacist dosing)   Does not apply See admin instructions   Continuous Infusions:  sodium chloride Stopped (04/20/21  7939)   azithromycin 500 mg (04/22/21 0313)   piperacillin-tazobactam (ZOSYN)  IV 3.375 g (04/22/21 1447)     LOS: 2 days   Time spent: 35 minutes.  Patrecia Pour, MD [facility] [email or web address] 04/22/2021, 3:55 PM

## 2021-04-22 NOTE — Progress Notes (Signed)
Progress Note  Patient Name: Todd [name] Date of Encounter: 04/22/2021  Primary Cardiologist:   Minus Breeding, MD   Subjective   No pain.  Breathing is much better.   Inpatient Medications    Scheduled Meds:  amLODipine  5 mg Oral Daily   apixaban  5 mg Oral BID   atorvastatin  40 mg Oral Daily   dextromethorphan-guaiFENesin  1 tablet Oral BID   feeding supplement (GLUCERNA SHAKE)  237 mL Oral TID BM   folic acid  1 mg Oral Daily   insulin aspart  0-5 Units Subcutaneous QHS   insulin aspart  0-9 Units Subcutaneous TID WC   insulin aspart  3 Units Subcutaneous TID WC   insulin glargine-yfgn  15 Units Subcutaneous Daily   ipratropium-albuterol  3 mL Nebulization BID   levothyroxine  112 mcg Oral QAC breakfast   megestrol  400 mg Oral BID   metoprolol succinate  50 mg Oral Daily   multivitamin with minerals  1 tablet Oral Daily   pantoprazole  40 mg Oral BID   thiamine  100 mg Oral Daily   vancomycin variable dose per unstable renal function (pharmacist dosing)   Does not apply See admin instructions   Continuous Infusions:  sodium chloride Stopped (04/20/21 0916)   azithromycin 500 mg (04/22/21 0313)   piperacillin-tazobactam (ZOSYN)  IV     PRN Meds: sodium chloride, acetaminophen, ipratropium-albuterol, nicotine   Vital Signs    Vitals:   04/22/21 0800 04/22/21 0806 04/22/21 0913 04/22/21 1126  BP:  (!) 144/97 138/79 (!) 140/93  Pulse: 100  70 86  Resp:  (!) 23 18 (!) 22  Temp:      TempSrc:      SpO2:   99% 97%  Weight:      Height:        Intake/Output Summary (Last 24 hours) at 04/22/2021 1335 Last data filed at 04/22/2021 0453 Gross per 24 hour  Intake 243.07 ml  Output 1175 ml  Net -931.93 ml   Filed Weights   04/20/21 1033 04/21/21 0337 04/22/21 0358  Weight: 68.2 kg 68.3 kg 70 kg    Telemetry    NSR with MAT - Personally Reviewed  ECG    NA - Personally Reviewed  Physical Exam   GEN: No acute distress.   Neck: No   JVD Cardiac: RRR, no murmurs, rubs, or gallops.  Respiratory: Clear  to auscultation bilaterally. GI: Soft, nontender, non-distended  MS: No upper extremity edema; No deformity. Neuro:  Nonfocal  Psych: Normal affect   Labs    Chemistry Recent Labs  Lab 04/19/21 0019 04/20/21 1507 04/22/21 0207  NA 141 140 136  K 4.0 3.9 4.6  CL 115* 116* 111  CO2 18* 16* 14*  GLUCOSE 174* 153* 384*  BUN 26* 26* 43*  CREATININE 1.89* 1.69* 2.25*  CALCIUM 7.9* 7.9* 7.9*  PROT 7.2 6.5 6.7  ALBUMIN 2.6* 2.4* 2.4*  AST 19 17 16   ALT 21 18 17   ALKPHOS 136* 100 124  BILITOT 0.5 0.7 0.5  GFRNONAA 39* 44* 32*  ANIONGAP 8 8 11      Hematology Recent Labs  Lab 04/19/21 0019 04/20/21 1507 04/22/21 0207  WBC 17.5* 16.3* 22.1*  RBC 3.94* 3.73* 3.66*  HGB 11.3* 10.7* 10.1*  HCT 35.3* 32.4* 32.1*  MCV 89.6 86.9 87.7  MCH 28.7 28.7 27.6  MCHC 32.0 33.0 31.5  RDW 16.7* 16.4* 16.3*  PLT 725* 657* 677*    Cardiac EnzymesNo  results for input(s): TROPONINI in the last 168 hours. No results for input(s): TROPIPOC in the last 168 hours.   BNP Recent Labs  Lab 04/19/21 0019  BNP 859.0*     DDimer No results for input(s): DDIMER in the last 168 hours.   Radiology    US RENAL  Result Date: 04/20/2021 CLINICAL DATA:  Acute kidney injury EXAM: RENAL / URINARY TRACT ULTRASOUND COMPLETE COMPARISON:  10/26/2017 FINDINGS: Right Kidney: Renal measurements: 11.8 x 5.7 x 6.4 cm = volume: 226.4 mL. Echogenicity within normal limits. No mass or hydronephrosis visualized. Trace right perinephric fluid. Left Kidney: Renal measurements: 11.6 x 6.2 x 6.4 cm = volume: 243.1 mL. Echogenicity within normal limits. No mass or hydronephrosis visualized. Bladder: Appears normal for degree of bladder distention. Other: None. IMPRESSION: Negative examination. Electronically Signed   By: Donavan Foil M.D.   On: 04/20/2021 15:42   DG CHEST PORT 1 VIEW  Result Date: 04/22/2021 CLINICAL DATA:  Shortness of breath  EXAM: PORTABLE CHEST 1 VIEW COMPARISON:  Three days ago FINDINGS: Severe airspace disease asymmetric to the right. Borderline heart size. No visible effusion or pneumothorax. IMPRESSION: Extensive and worsened airspace disease. Electronically Signed   By: Jorje Guild M.D.   On: 04/22/2021 05:52    Cardiac Studies   ECHO:  Echo 04/20/21:  1. Left ventricular ejection fraction, by estimation, is 45 to 50%. The  left ventricle has mildly decreased function. There is moderate left  ventricular hypertrophy.   2. Right ventricular systolic function is normal. The right ventricular  size is normal.   3. Left atrial size was severely dilated.   4. Right atrial size was moderately dilated.   5. The inferior vena cava is normal in size with greater than 50%  respiratory variability, suggesting right atrial pressure of 3 mmHg.   6. Limited echo evaluate LV function.   Patient Profile     65 y.o. male with a hx of PAD, mild cardiomyopathy, DM, CKD stage II, hypothyroidism with poor compliance, and polysubstance abuse (cocaine, alcohol, tobacco)  who is being seen 04/21/2021 for the evaluation of CHF at the request of Dr. Bonner Puna.  Assessment & Plan    Acute on chronic systolic heart failure Creat jumped.  Holding all nephrotoxic agents and further Lasix.  I have added BiDil.    PAF / atrial tachycardia Chronic anticoagulation On Eliquis.  Plan to titrate beta blocker next for control of rate and HF management.     Hyperlipidemia He had very low lipids earlier this year.  Continue current statin.    AKI Avoiding further diuresis or nephrotoxin.  Follow creat.      DM Unable to start [place] with jump in creat.          For questions or updates, please contact [place] Please consult [email or web address] for contact info under Cardiology/STEMI.   Signed, Minus Breeding, MD  04/22/2021, 1:35 PM

## 2021-04-22 NOTE — Progress Notes (Signed)
X-cover note  ABG    Component Value Date/Time   PHART 7.391 04/22/2021 0600   PCO2ART 22.4 (L) 04/22/2021 0600   PO2ART 62.7 (L) 04/22/2021 0600   HCO3 13.3 (L) 04/22/2021 0600   TCO2 19 08/08/2016 1626   ACIDBASEDEF 10.7 (H) 04/22/2021 0600   O2SAT 90.7 04/22/2021 0600   CXR FINDINGS: Severe airspace disease asymmetric to the right. Borderline heart size. No visible effusion or pneumothorax.   IMPRESSION: Extensive and worsened airspace disease.  Below shows today's CXR vs cxr on 04-19-2021    Assessment: Worsening CAP despite standard treatment with Rocephin/Zithromax  Plan: Will broaden coverage to zosyn and vanco. Continue zithromax for atypical. Consider pulmonary consult Consider transfer to higher level of care.  Kristopher Oppenheim, DO

## 2021-04-23 ENCOUNTER — Other Ambulatory Visit (HOSPITAL_COMMUNITY): Payer: Self-pay

## 2021-04-23 DIAGNOSIS — J9601 Acute respiratory failure with hypoxia: Secondary | ICD-10-CM | POA: Diagnosis not present

## 2021-04-23 DIAGNOSIS — N179 Acute kidney failure, unspecified: Secondary | ICD-10-CM | POA: Diagnosis not present

## 2021-04-23 DIAGNOSIS — J189 Pneumonia, unspecified organism: Secondary | ICD-10-CM | POA: Diagnosis not present

## 2021-04-23 DIAGNOSIS — E039 Hypothyroidism, unspecified: Secondary | ICD-10-CM | POA: Diagnosis not present

## 2021-04-23 DIAGNOSIS — E7219 Other disorders of sulfur-bearing amino-acid metabolism: Secondary | ICD-10-CM

## 2021-04-23 LAB — CBC WITH DIFFERENTIAL/PLATELET
Abs Immature Granulocytes: 0.17 10*3/uL — ABNORMAL HIGH (ref 0.00–0.07)
Basophils Absolute: 0 10*3/uL (ref 0.0–0.1)
Basophils Relative: 0 %
Eosinophils Absolute: 0 10*3/uL (ref 0.0–0.5)
Eosinophils Relative: 0 %
HCT: 27.8 % — ABNORMAL LOW (ref 39.0–52.0)
Hemoglobin: 8.8 g/dL — ABNORMAL LOW (ref 13.0–17.0)
Immature Granulocytes: 1 %
Lymphocytes Relative: 14 %
Lymphs Abs: 2.2 10*3/uL (ref 0.7–4.0)
MCH: 28.1 pg (ref 26.0–34.0)
MCHC: 31.7 g/dL (ref 30.0–36.0)
MCV: 88.8 fL (ref 80.0–100.0)
Monocytes Absolute: 1.1 10*3/uL — ABNORMAL HIGH (ref 0.1–1.0)
Monocytes Relative: 7 %
Neutro Abs: 12.3 10*3/uL — ABNORMAL HIGH (ref 1.7–7.7)
Neutrophils Relative %: 78 %
Platelets: 581 10*3/uL — ABNORMAL HIGH (ref 150–400)
RBC: 3.13 MIL/uL — ABNORMAL LOW (ref 4.22–5.81)
RDW: 16.3 % — ABNORMAL HIGH (ref 11.5–15.5)
WBC: 15.8 10*3/uL — ABNORMAL HIGH (ref 4.0–10.5)
nRBC: 0 % (ref 0.0–0.2)

## 2021-04-23 LAB — RENAL FUNCTION PANEL
Albumin: 1.8 g/dL — ABNORMAL LOW (ref 3.5–5.0)
Anion gap: 10 (ref 5–15)
BUN: 40 mg/dL — ABNORMAL HIGH (ref 8–23)
CO2: 16 mmol/L — ABNORMAL LOW (ref 22–32)
Calcium: 7.2 mg/dL — ABNORMAL LOW (ref 8.9–10.3)
Chloride: 114 mmol/L — ABNORMAL HIGH (ref 98–111)
Creatinine, Ser: 2.42 mg/dL — ABNORMAL HIGH (ref 0.61–1.24)
GFR, Estimated: 29 mL/min — ABNORMAL LOW (ref >60)
Glucose, Bld: 226 mg/dL — ABNORMAL HIGH (ref 70–99)
Phosphorus: 4.6 mg/dL (ref 2.5–4.6)
Potassium: 3.7 mmol/L (ref 3.5–5.1)
Sodium: 140 mmol/L (ref 135–145)

## 2021-04-23 LAB — GLUCOSE, CAPILLARY
Glucose-Capillary: 158 mg/dL — ABNORMAL HIGH (ref 70–99)
Glucose-Capillary: 129 mg/dL — ABNORMAL HIGH (ref 70–99)
Glucose-Capillary: 124 mg/dL — ABNORMAL HIGH (ref 70–99)
Glucose-Capillary: 118 mg/dL — ABNORMAL HIGH (ref 70–99)

## 2021-04-23 LAB — PROCALCITONIN: Procalcitonin: 0.12 ng/mL

## 2021-04-23 MED ORDER — LEVOTHYROXINE SODIUM 100 MCG PO TABS
100.0000 ug | ORAL_TABLET | Freq: Every day | ORAL | Status: DC
  Administered 2021-04-24 – 2021-05-11 (×18): 100 ug via ORAL
  Filled 2021-04-23 (×18): qty 1

## 2021-04-23 MED ORDER — METOPROLOL SUCCINATE ER 100 MG PO TB24
100.0000 mg | ORAL_TABLET | Freq: Every day | ORAL | Status: DC
  Administered 2021-04-24 – 2021-05-11 (×18): 100 mg via ORAL
  Filled 2021-04-23 (×18): qty 1

## 2021-04-23 NOTE — TOC Benefit Eligibility Note (Signed)
Patient Teacher, English as a foreign language completed.    The patient is currently admitted and upon discharge could be taking Bidil tablet.  Product Not on Formulary but will cover the individual ingredients.  The patient is insured through [place], [place] Patient Advocate Specialist [place] Patient Advocate Team Direct Number: [phone]  Fax: [phone]

## 2021-04-23 NOTE — Progress Notes (Signed)
Progress Note  Patient Name: Todd [name] Date of Encounter: 04/23/2021  Primary Cardiologist:   Minus Breeding, MD   Subjective   He denies any chest pain or SOB.   Inpatient Medications    Scheduled Meds:  apixaban  5 mg Oral BID   atorvastatin  40 mg Oral Daily   dextromethorphan-guaiFENesin  1 tablet Oral BID   feeding supplement (GLUCERNA SHAKE)  237 mL Oral TID BM   folic acid  1 mg Oral Daily   insulin aspart  0-5 Units Subcutaneous QHS   insulin aspart  0-9 Units Subcutaneous TID WC   insulin aspart  3 Units Subcutaneous TID WC   insulin glargine-yfgn  15 Units Subcutaneous Daily   ipratropium-albuterol  3 mL Nebulization BID   isosorbide-hydrALAZINE  1 tablet Oral TID   levothyroxine  112 mcg Oral QAC breakfast   megestrol  400 mg Oral BID   metoprolol succinate  50 mg Oral Daily   multivitamin with minerals  1 tablet Oral Daily   pantoprazole  40 mg Oral BID   sodium bicarbonate  650 mg Oral TID   thiamine  100 mg Oral Daily   Continuous Infusions:  sodium chloride Stopped (04/20/21 0916)   azithromycin 500 mg (04/23/21 0423)   piperacillin-tazobactam (ZOSYN)  IV 3.375 g (04/23/21 0618)   PRN Meds: sodium chloride, acetaminophen, ipratropium-albuterol, nicotine   Vital Signs    Vitals:   04/23/21 0726 04/23/21 0846 04/23/21 0947 04/23/21 1113  BP: (!) 139/93 (!) 137/96  (!) 145/103  Pulse: 88   97  Resp: (!) 23 20  18   Temp: 98.4 F (36.9 C)   98.3 F (36.8 C)  TempSrc: Oral   Oral  SpO2:  99% 99%   Weight:      Height:        Intake/Output Summary (Last 24 hours) at 04/23/2021 1232 Last data filed at 04/23/2021 0617 Gross per 24 hour  Intake 0 ml  Output 1450 ml  Net -1450 ml   Filed Weights   04/21/21 0337 04/22/21 0358 04/23/21 0500  Weight: 68.3 kg 70 kg 68.3 kg    Telemetry    NSR with MAT- Personally Reviewed  ECG    NA - Personally Reviewed  Physical Exam   GEN: No  acute distress.   Neck: No  JVD Cardiac: RRR,  no murmurs, rubs, or gallops.  Respiratory: Clear   to auscultation bilaterally. GI: Soft, nontender, non-distended, normal bowel sounds  MS:  No upper extremity edema; No deformity.  He is status post bilateral lower extremity amputations.  Neuro:   Nonfocal  Psych: Oriented and appropriate    Labs    Chemistry Recent Labs  Lab 04/19/21 0019 04/20/21 1507 04/22/21 0207 04/23/21 0339  NA 141 140 136 140  K 4.0 3.9 4.6 3.7  CL 115* 116* 111 114*  CO2 18* 16* 14* 16*  GLUCOSE 174* 153* 384* 226*  BUN 26* 26* 43* 40*  CREATININE 1.89* 1.69* 2.25* 2.42*  CALCIUM 7.9* 7.9* 7.9* 7.2*  PROT 7.2 6.5 6.7  --   ALBUMIN 2.6* 2.4* 2.4* 1.8*  AST 19 17 16   --   ALT 21 18 17   --   ALKPHOS 136* 100 124  --   BILITOT 0.5 0.7 0.5  --   GFRNONAA 39* 44* 32* 29*  ANIONGAP 8 8 11 10      Hematology Recent Labs  Lab 04/20/21 1507 04/22/21 0207 04/23/21 0339  WBC 16.3* 22.1* 15.8*  RBC 3.73* 3.66* 3.13*  HGB 10.7* 10.1* 8.8*  HCT 32.4* 32.1* 27.8*  MCV 86.9 87.7 88.8  MCH 28.7 27.6 28.1  MCHC 33.0 31.5 31.7  RDW 16.4* 16.3* 16.3*  PLT 657* 677* 581*    Cardiac EnzymesNo results for input(s): TROPONINI in the last 168 hours. No results for input(s): TROPIPOC in the last 168 hours.   BNP Recent Labs  Lab 04/19/21 0019  BNP 859.0*     DDimer No results for input(s): DDIMER in the last 168 hours.   Radiology    DG CHEST PORT 1 VIEW  Result Date: 04/22/2021 CLINICAL DATA:  Shortness of breath EXAM: PORTABLE CHEST 1 VIEW COMPARISON:  Three days ago FINDINGS: Severe airspace disease asymmetric to the right. Borderline heart size. No visible effusion or pneumothorax. IMPRESSION: Extensive and worsened airspace disease. Electronically Signed   By: Jorje Guild M.D.   On: 04/22/2021 05:52    Cardiac Studies   ECHO:  Echo 04/20/21:  1. Left ventricular ejection fraction, by estimation, is 45 to 50%. The  left ventricle has mildly decreased function. There is moderate left   ventricular hypertrophy.   2. Right ventricular systolic function is normal. The right ventricular  size is normal.   3. Left atrial size was severely dilated.   4. Right atrial size was moderately dilated.   5. The inferior vena cava is normal in size with greater than 50%  respiratory variability, suggesting right atrial pressure of 3 mmHg.   6. Limited echo evaluate LV function.   Patient Profile     65 y.o. male with a hx of PAD, mild cardiomyopathy, DM, CKD stage II, hypothyroidism with poor compliance, and polysubstance abuse (cocaine, alcohol, tobacco)  who is being seen 04/21/2021 for the evaluation of CHF at the request of Dr. Bonner Puna.  Assessment & Plan    Acute on chronic systolic heart failure Creat jumped still up.  Holding all nephrotoxic agents and further Lasix.  Added BiDil.     PAF / atrial tachycardia Chronic anticoagulation On Eliquis.  Still with MAT.  Also previous brief runs of atrial fib.  (None this admission).  I will increase the Toprol today.     Hyperlipidemia He had very low lipids earlier this year.  Continue current statin.    AKI Avoiding further diuresis or nephrotoxin.  Follow creat.      DM Unable to start [place] with jump in creat.          For questions or updates, please contact [place] Please consult [email or web address] for contact info under Cardiology/STEMI.   Signed, Minus Breeding, MD  04/23/2021, 12:32 PM

## 2021-04-23 NOTE — Progress Notes (Signed)
Initial Nutrition Assessment  DOCUMENTATION CODES:   Not applicable  INTERVENTION:   Snacks TID between meals. D/C Glucerna Shakes, patient does not like them. Continue MVI with minerals daily.  NUTRITION DIAGNOSIS:   Increased nutrient needs related to chronic illness (CHF) as evidenced by estimated needs.  GOAL:   Patient will meet greater than or equal to 90% of their needs  MONITOR:   PO intake, Labs  REASON FOR ASSESSMENT:   Consult Assessment of nutrition requirement/status  ASSESSMENT:   65 y.o. male presented to the ED from a SNF with SOB. PMH includes HTN, CHF, T2DM, bilateral AKA, and EtOH abuse. Pt admitted with community acquired pneumonia.  Patient reports that he has been eating well. He thinks his weight has been stable. He does not like the Glucerna shakes, will D/C. He would like snacks TID between meals.   Low albumin or prealbumin is no longer used to diagnose malnutrition and are a poor indicator of nutrition status. These labs are reflective of inflammatory process, acute stress response, and levels are highly affected (decreased) by volume overload. [place] uses the new malnutrition guidelines published by the American Society for Parenteral and Enteral Nutrition (A.S.P.E.N.) and the Academy of Nutrition and Dietetics (AND).   Labs reviewed.  CBG: 158-129  Medications reviewed and include folic acid, novolog, semglee, megace, MVI with minerals, protonix, thiamine.  Weight history reviewed. Weight is up to 68.3 kg today from 59.1 kg on 01/10/21. Increase in weight likely related to fluid overload with CHF.   NUTRITION - FOCUSED PHYSICAL EXAM:  Flowsheet Row Most Recent Value  Orbital Region No depletion  Upper Arm Region No depletion  Thoracic and Lumbar Region No depletion  Buccal Region No depletion  Temple Region No depletion  Clavicle Bone Region No depletion  Clavicle and Acromion Bone Region No depletion  Scapular Bone Region No  depletion  Dorsal Hand No depletion  Patellar Region No depletion  Anterior Thigh Region No depletion  Posterior Calf Region No depletion  Edema (RD Assessment) Unable to assess  [bilateral AKA]  Hair Reviewed  Eyes Reviewed  Mouth Reviewed  Skin Reviewed  Nails Reviewed       Diet Order:   Diet Order             Diet heart healthy/carb modified Room service appropriate? Yes; Fluid consistency: Thin  Diet effective now                   EDUCATION NEEDS:   No education needs have been identified at this time  Skin:  Skin Assessment: Reviewed RN Assessment  Last BM:  04/22/2021  Height:   Ht Readings from Last 1 Encounters:  04/19/21 5\' 8"  (1.727 m)    Weight:   Wt Readings from Last 1 Encounters:  04/23/21 68.3 kg    BMI:  Body mass index is 22.89 kg/m.  Estimated Nutritional Needs:   Kcal:  1800-2000  Protein:  90-100 gm  Fluid:  1.8-2 L    Lucas Mallow, RD, LDN, CNSC Please refer to Amion for contact information.

## 2021-04-23 NOTE — Care Management Important Message (Signed)
Important Message  Patient Details  Name: Graiden Henes MRN: [id number] Date of Birth: [birth year]   Medicare Important Message Given:  Yes     [name] 04/23/2021, 12:39 PM

## 2021-04-23 NOTE — Plan of Care (Signed)
  Problem: Nutrition: Goal: Adequate nutrition will be maintained Outcome: Progressing   Problem: Safety: Goal: Ability to remain free from injury will improve Outcome: Progressing   

## 2021-04-23 NOTE — Progress Notes (Signed)
PROGRESS NOTE  Todd [name]  SFK:[id number] DOB: 05/30/1956 DOA: 04/19/2021 PCP: [name], FNP  Brief Narrative: Todd [name] is a 65 y.o. male recently admitted from 01/10/2021 to 01/15/2021 due to chronic A. fib with RVR, with medical history significant for essential hypertension, hypothyroidism, type 2 diabetes mellitus, chronic atrial fibrillation, status post bilateral AKA, history of polysubstance abuse (alcohol, tobacco and cocaine) who presents to [facility] ED from [facility] via EMS due to shortness of breath.  He was noted to be hypoxic, in the 80% on room air at the SNF, neb treatment was provided and patient was placed on a supplemental oxygen via [place] at 2 LPM with minimal improvement.  EMS was activated and on arrival, O2 supplementation was increased to 4 LPM en route to the hospital.   Work-up revealed acute hypoxic respiratory failure, right-sided community-acquired pneumonia, mild pulmonary edema, elevated troponin, elevated BNP greater than 800, acute on chronic diastolic CHF, AKI, non-anion gap metabolic acidosis, and electrolyte abnormalities.  Echocardiogram revealed LV systolic dysfunction and monitoring detected RVR. Lasix was given with subsequently increasing creatinine. Antibiotics were continued, and broadened with worsening hypoxia and progressive R > L CXR infiltrates.   Assessment & Plan: Principal Problem:   CAP (community acquired pneumonia) Active Problems:   Essential hypertension   Acquired hypothyroidism   Elevated troponin   Acute respiratory failure with hypoxia (HCC)   Hyperglycemia due to diabetes mellitus (HCC)   Leukocytosis   Reactive thrombocytosis   S/P AKA (above knee amputation) bilateral (HCC)   AKI (acute kidney injury) ([place])   Atrial fibrillation, chronic (HCC)   Hypomagnesemia   Elevated brain natriuretic peptide (BNP) level   Polysubstance abuse (HCC)   Noncompliance with medication regimen   Chronic diastolic CHF  (congestive heart failure) (HCC)   Hypoalbuminemia due to protein-calorie malnutrition (HCC)   GERD (gastroesophageal reflux disease)  Acute hypoxic respiratory failure: Presumed to be due to CHF and right-sided pneumonia, COPD exacerbation also possible given previous reports of wheezing. Bronchitic changes on CXR with hazy ground glass appearance of right lung. Opacity on right advanced considerably on repeat 11/10. Quickly improved hypoxia on 11/11, now off oxygen. - If stable pulmonary status overnight, transfer to telemetry 11/12.  PAF, multifocal atrial tachycardia: Recent admit for RVR.  - Continue metoprolol succinate, increased to 100mg  dose due to persistent MAT per cardiology. - Maintain K >4, Mg >2  Acute on chronic HFrEF, HTN: LVEF 45-50% without RWMA, +LVH, normal IVC. Severe LAE, moderate RAE. BNP elevated.  - Cardiology consulted. Unable to continue diuresis/start SGLT2 with creatinine bump - Converted norvasc to bidil. Continue metoprolol succinate.   AKI, continues worsening with NAGMA: No abnormality on U/S, though did have trace perinephric fluid on right.  - Check UA w/micro, check Pr:Cr, FENa - DC vancomycin as above.   - Continue bicarbonate tabs  Demand myocardial ischemia: Mild troponin elevation with reassuring delta no anginal complaints.  - Defer w/u to cardiology  Right-sided CAP: Progressive infiltrates with hypoxia overnight into 11/10.  - WBC declining and PCT continues to be reassuring. Can DC vancomycin with low suspicion for MRSA pneumonia given abrupt improvement and negative MRSA PCR.  - Will continue zosyn (changed from CTX), and azithromycin for atypical coverage.  - Monitor culture data. Blood Cx's NGTD x3 days, no sputum culture obtained.  COPD with exacerbation:  - Continue nebs. Given resolution of wheezing and severe steroid-induced hyperglycemia, steroids were stopped. Exam continues to be inconsistent with bronchospasm.   Uncontrolled  T2DM  with steroid-induced hyperglycemia: HbA1c 7.9%.  - Increased glargine insulin to 15u, give 3u TIDWC + SSI, now at inpatient goal  Polysubstance abuse: Previously +alcohol, tobacco, cocaine.  - Cessation counseling. Monitor for withdrawal, continue nicotine patch.   Hypothyroidism:  - Repeat TSH is 0.315, last was 20.52 in September 2022 which was improved from 189 in July in setting of medication nonadherence. Free T4 is 1.12 which is the ULN. In setting of AFib/MAT, will decrease synthroid dose 162mcg > 141mcg.   Anemia of chronic disease: Having a decline in hgb from admission, though all cell lines are down and hgb was above baseline on admission.  - Monitor CBC in AM. If declining further, would need work up, though has no gross bleeding   HTN: Tx as above  PVD s/p bilateral AKA (2018-2019):  - Continue statin, anticoagulation  GERD:  - Continue PPI  Suspected malnutrition: Albumin is 1.8. - Dietitian consulted.   DVT prophylaxis: Eliquis Code Status: Full Family Communication: None at bedside Disposition Plan:  Status is: Inpatient  Remains inpatient appropriate because: requires treatment for acute CHF and pneumonia.   Consultants:  Cardiology  Procedures:  Echocardiogram  Antimicrobials: Ceftriaxone, azithromycin > vancomycin, zosyn.  Subjective: No complaints at all today. Says he's been feeling better over past 24 hours. No chest or other pain. No shortness of breath. He ate a full meal for breakfast and is eating graham crackers. Denies palpitations.   Objective: Vitals:   04/23/21 0726 04/23/21 0846 04/23/21 0947 04/23/21 1113  BP: (!) 139/93 (!) 137/96  (!) 145/103  Pulse: 88   97  Resp: (!) 23 20  18   Temp: 98.4 F (36.9 C)   98.3 F (36.8 C)  TempSrc: Oral   Oral  SpO2:  99% 99%   Weight:      Height:        Intake/Output Summary (Last 24 hours) at 04/23/2021 1353 Last data filed at 04/23/2021 0617 Gross per 24 hour  Intake 0 ml  Output 1450  ml  Net -1450 ml   Filed Weights   04/21/21 0337 04/22/21 0358 04/23/21 0500  Weight: 68.3 kg 70 kg 68.3 kg   Gen: Chronically ill-appearing male in no distress Pulm: Nonlabored breathing room air with slight belly breathing. Clear. CV: Regular rate and rhythm currently, some MAT overnight. No murmur, rub, or gallop. No JVD, no dependent edema at the hip, upper LE's. GI: Abdomen soft, non-tender, non-distended, with normoactive bowel sounds.  Ext: Warm, dry s/p AKA's. Skin: No rashes, lesions or ulcers on visualized skin. Neuro: Alert and oriented. No focal neurological deficits. Psych: Judgement and insight appear fair. Mood euthymic & affect congruent. Behavior is appropriate.    Data Reviewed: I have personally reviewed following labs and imaging studies  CBC: Recent Labs  Lab 04/19/21 0019 04/20/21 1507 04/22/21 0207 04/23/21 0339  WBC 17.5* 16.3* 22.1* 15.8*  NEUTROABS 13.6*  --   --  12.3*  HGB 11.3* 10.7* 10.1* 8.8*  HCT 35.3* 32.4* 32.1* 27.8*  MCV 89.6 86.9 87.7 88.8  PLT 725* 657* 677* 045*   Basic Metabolic Panel: Recent Labs  Lab 04/19/21 0019 04/20/21 0254 04/20/21 1507 04/21/21 0327 04/22/21 0207 04/23/21 0339  NA 141  --  140  --  136 140  K 4.0  --  3.9  --  4.6 3.7  CL 115*  --  116*  --  111 114*  CO2 18*  --  16*  --  14* 16*  GLUCOSE 174*  --  153*  --  384* 226*  BUN 26*  --  26*  --  43* 40*  CREATININE 1.89*  --  1.69*  --  2.25* 2.42*  CALCIUM 7.9*  --  7.9*  --  7.9* 7.2*  MG  --  1.6*  --  2.5*  --   --   PHOS  --  3.8  --  5.1*  --  4.6   GFR: Estimated Creatinine Clearance: 29.4 mL/min (A) (by C-G formula based on SCr of 2.42 mg/dL (H)). Liver Function Tests: Recent Labs  Lab 04/19/21 0019 04/20/21 1507 04/22/21 0207 04/23/21 0339  AST 19 17 16   --   ALT 21 18 17   --   ALKPHOS 136* 100 124  --   BILITOT 0.5 0.7 0.5  --   PROT 7.2 6.5 6.7  --   ALBUMIN 2.6* 2.4* 2.4* 1.8*   No results for input(s): LIPASE, AMYLASE in the  last 168 hours. No results for input(s): AMMONIA in the last 168 hours. Coagulation Profile: Recent Labs  Lab 04/20/21 0254  INR 1.4*   Cardiac Enzymes: No results for input(s): CKTOTAL, CKMB, CKMBINDEX, TROPONINI in the last 168 hours. BNP (last 3 results) No results for input(s): PROBNP in the last 8760 hours. HbA1C: Recent Labs    04/20/21 1507  HGBA1C 7.9*   CBG: Recent Labs  Lab 04/22/21 1124 04/22/21 1716 04/22/21 2105 04/23/21 0725 04/23/21 1112  GLUCAP 313* 77 118* 158* 129*   Lipid Profile: No results for input(s): CHOL, HDL, LDLCALC, TRIG, CHOLHDL, LDLDIRECT in the last 72 hours. Thyroid Function Tests: Recent Labs    04/22/21 0207 04/22/21 1612  TSH 0.315*  --   FREET4  --  1.12   Anemia Panel: No results for input(s): VITAMINB12, FOLATE, FERRITIN, TIBC, IRON, RETICCTPCT in the last 72 hours. Urine analysis:    Component Value Date/Time   COLORURINE AMBER (A) 01/11/2021 1030   APPEARANCEUR CLOUDY (A) 01/11/2021 1030   APPEARANCEUR Clear 02/09/2018 1352   LABSPEC 1.017 01/11/2021 1030   PHURINE 5.0 01/11/2021 1030   GLUCOSEU NEGATIVE 01/11/2021 1030   HGBUR SMALL (A) 01/11/2021 1030   BILIRUBINUR NEGATIVE 01/11/2021 1030   BILIRUBINUR Negative 02/09/2018 1352   KETONESUR NEGATIVE 01/11/2021 1030   PROTEINUR 30 (A) 01/11/2021 1030   NITRITE NEGATIVE 01/11/2021 1030   LEUKOCYTESUR MODERATE (A) 01/11/2021 1030   Recent Results (from the past 240 hour(s))  Resp Panel by RT-PCR (Flu A&B, Covid) Nasopharyngeal Swab     Status: None   Collection Time: 04/19/21 12:12 AM   Specimen: Nasopharyngeal Swab; Nasopharyngeal(NP) swabs in vial transport medium  Result Value Ref Range Status   SARS Coronavirus 2 by RT PCR NEGATIVE NEGATIVE Final    Comment: (NOTE) SARS-CoV-2 target nucleic acids are NOT DETECTED.  The SARS-CoV-2 RNA is generally detectable in upper respiratory specimens during the acute phase of infection. The lowest concentration of  SARS-CoV-2 viral copies this assay can detect is 138 copies/mL. A negative result does not preclude SARS-Cov-2 infection and should not be used as the sole basis for treatment or other patient management decisions. A negative result may occur with  improper specimen collection/handling, submission of specimen other than nasopharyngeal swab, presence of viral mutation(s) within the areas targeted by this assay, and inadequate number of viral copies(<138 copies/mL). A negative result must be combined with clinical observations, patient history, and epidemiological information. The expected result is Negative.  Fact Sheet for Patients:  EntrepreneurPulse.com.au  Fact Sheet for Healthcare Providers:  IncredibleEmployment.be  This test is no t yet approved or cleared by the [place] FDA and  has been authorized for detection and/or diagnosis of SARS-CoV-2 by FDA under an Emergency Use Authorization (EUA). This EUA will remain  in effect (meaning this test can be used) for the duration of the COVID-19 declaration under Section 564(b)(1) of the Act, 21 U.S.C.section 360bbb-3(b)(1), unless the authorization is terminated  or revoked sooner.       Influenza A by PCR NEGATIVE NEGATIVE Final   Influenza B by PCR NEGATIVE NEGATIVE Final    Comment: (NOTE) The Xpert Xpress SARS-CoV-2/FLU/RSV plus assay is intended as an aid in the diagnosis of influenza from Nasopharyngeal swab specimens and should not be used as a sole basis for treatment. Nasal washings and aspirates are unacceptable for Xpert Xpress SARS-CoV-2/FLU/RSV testing.  Fact Sheet for Patients: EntrepreneurPulse.com.au  Fact Sheet for Healthcare Providers: IncredibleEmployment.be  This test is not yet approved or cleared by the [place] FDA and has been authorized for detection and/or diagnosis of SARS-CoV-2 by FDA under an Emergency Use  Authorization (EUA). This EUA will remain in effect (meaning this test can be used) for the duration of the COVID-19 declaration under Section 564(b)(1) of the Act, 21 U.S.C. section 360bbb-3(b)(1), unless the authorization is terminated or revoked.  Performed at [facility], [address] [place], [place] [address]   Culture, blood (Routine X 2) w Reflex to ID Panel     Status: None (Preliminary result)   Collection Time: 04/20/21 12:52 AM   Specimen: BLOOD  Result Value Ref Range Status   Specimen Description BLOOD LEFT ANTECUBITAL  Final   Special Requests   Final    BOTTLES DRAWN AEROBIC AND ANAEROBIC Blood Culture adequate volume   Culture   Final    NO GROWTH 3 DAYS Performed at [facility], [address], [place], [place] [address]    Report Status PENDING  Incomplete  Culture, blood (Routine X 2) w Reflex to ID Panel     Status: None (Preliminary result)   Collection Time: 04/20/21 12:52 AM   Specimen: BLOOD  Result Value Ref Range Status   Specimen Description BLOOD BLOOD LEFT HAND  Final   Special Requests   Final    BOTTLES DRAWN AEROBIC AND ANAEROBIC Blood Culture adequate volume   Culture   Final    NO GROWTH 3 DAYS Performed at [facility], [address] [place], [place] [address]    Report Status PENDING  Incomplete  MRSA Next Gen by PCR, Nasal     Status: None   Collection Time: 04/22/21  4:40 PM   Specimen: Nasal Mucosa; Nasal Swab  Result Value Ref Range Status   MRSA by PCR Next Gen NOT DETECTED NOT DETECTED Final    Comment: (NOTE) The GeneXpert MRSA Assay (FDA approved for NASAL specimens only), is one component of a comprehensive MRSA colonization surveillance program. It is not intended to diagnose MRSA infection nor to guide or monitor treatment for MRSA infections. Test performance is not FDA approved in patients less than 39 years old. Performed at [facility], [place] [address], [place], [place] [address]       Radiology Studies: DG  CHEST PORT 1 VIEW  Result Date: 04/22/2021 CLINICAL DATA:  Shortness of breath EXAM: PORTABLE CHEST 1 VIEW COMPARISON:  Three days ago FINDINGS: Severe airspace disease asymmetric to the right. Borderline heart size. No visible effusion or pneumothorax.  IMPRESSION: Extensive and worsened airspace disease. Electronically Signed   By: Jorje Guild M.D.   On: 04/22/2021 05:52    Scheduled Meds:  apixaban  5 mg Oral BID   atorvastatin  40 mg Oral Daily   dextromethorphan-guaiFENesin  1 tablet Oral BID   feeding supplement (GLUCERNA SHAKE)  237 mL Oral TID BM   folic acid  1 mg Oral Daily   insulin aspart  0-5 Units Subcutaneous QHS   insulin aspart  0-9 Units Subcutaneous TID WC   insulin aspart  3 Units Subcutaneous TID WC   insulin glargine-yfgn  15 Units Subcutaneous Daily   ipratropium-albuterol  3 mL Nebulization BID   isosorbide-hydrALAZINE  1 tablet Oral TID   levothyroxine  112 mcg Oral QAC breakfast   megestrol  400 mg Oral BID   [START ON 04/24/2021] metoprolol succinate  100 mg Oral Daily   multivitamin with minerals  1 tablet Oral Daily   pantoprazole  40 mg Oral BID   sodium bicarbonate  650 mg Oral TID   thiamine  100 mg Oral Daily   Continuous Infusions:  sodium chloride Stopped (04/20/21 0916)   azithromycin 500 mg (04/23/21 0423)   piperacillin-tazobactam (ZOSYN)  IV 3.375 g (04/23/21 0618)     LOS: 3 days   Time spent: 35 minutes.  Patrecia Pour, MD [facility] [email or web address] 04/23/2021, 1:53 PM

## 2021-04-23 NOTE — Progress Notes (Signed)
   04/23/21 0005  Assess: MEWS Score  BP 127/83  Pulse Rate 97  ECG Heart Rate (!) 105  Resp (!) 22  SpO2 100 %  Assess: MEWS Score  MEWS Temp 0  MEWS Systolic 0  MEWS Pulse 1  MEWS RR 1  MEWS LOC 0  MEWS Score 2  MEWS Score Color Yellow  Assess: if the MEWS score is Yellow or Red  Were vital signs taken at a resting state? Yes  Focused Assessment No change from prior assessment  Early Detection of Sepsis Score *See Row Information* Low  MEWS guidelines implemented *See Row Information* Yes  Treat  MEWS Interventions Escalated (See documentation below)  Escalate  MEWS: Escalate Yellow: discuss with charge nurse/RN and consider discussing with provider and RRT  Notify: Charge Nurse/RN  Name of Charge Nurse/RN Notified Shanell RN  Date Charge Nurse/RN Notified 04/23/21  Time Charge Nurse/RN Notified 0354  Document  Patient Outcome Not stable and remains on department  Progress note created (see row info) Yes

## 2021-04-24 DIAGNOSIS — I482 Chronic atrial fibrillation, unspecified: Secondary | ICD-10-CM | POA: Diagnosis not present

## 2021-04-24 DIAGNOSIS — E039 Hypothyroidism, unspecified: Secondary | ICD-10-CM | POA: Diagnosis not present

## 2021-04-24 DIAGNOSIS — J9601 Acute respiratory failure with hypoxia: Secondary | ICD-10-CM | POA: Diagnosis not present

## 2021-04-24 DIAGNOSIS — I5032 Chronic diastolic (congestive) heart failure: Secondary | ICD-10-CM | POA: Diagnosis not present

## 2021-04-24 DIAGNOSIS — J189 Pneumonia, unspecified organism: Secondary | ICD-10-CM | POA: Diagnosis not present

## 2021-04-24 DIAGNOSIS — N179 Acute kidney failure, unspecified: Secondary | ICD-10-CM | POA: Diagnosis not present

## 2021-04-24 LAB — RENAL FUNCTION PANEL
Albumin: 1.9 g/dL — ABNORMAL LOW (ref 3.5–5.0)
Anion gap: 10 (ref 5–15)
BUN: 42 mg/dL — ABNORMAL HIGH (ref 8–23)
CO2: 15 mmol/L — ABNORMAL LOW (ref 22–32)
Calcium: 6.9 mg/dL — ABNORMAL LOW (ref 8.9–10.3)
Chloride: 115 mmol/L — ABNORMAL HIGH (ref 98–111)
Creatinine, Ser: 2.34 mg/dL — ABNORMAL HIGH (ref 0.61–1.24)
GFR, Estimated: 30 mL/min — ABNORMAL LOW (ref >60)
Glucose, Bld: 189 mg/dL — ABNORMAL HIGH (ref 70–99)
Phosphorus: 4.3 mg/dL (ref 2.5–4.6)
Potassium: 3.5 mmol/L (ref 3.5–5.1)
Sodium: 140 mmol/L (ref 135–145)

## 2021-04-24 LAB — URINALYSIS, COMPLETE (UACMP) WITH MICROSCOPIC
Bacteria, UA: NONE SEEN
Bilirubin Urine: NEGATIVE
Glucose, UA: NEGATIVE mg/dL
Hgb urine dipstick: NEGATIVE
Ketones, ur: NEGATIVE mg/dL
Leukocytes,Ua: NEGATIVE
Nitrite: NEGATIVE
Protein, ur: 100 mg/dL — AB
Specific Gravity, Urine: 1.018 (ref 1.005–1.030)
pH: 5 (ref 5.0–8.0)

## 2021-04-24 LAB — GLUCOSE, CAPILLARY
Glucose-Capillary: 94 mg/dL (ref 70–99)
Glucose-Capillary: 93 mg/dL (ref 70–99)
Glucose-Capillary: 158 mg/dL — ABNORMAL HIGH (ref 70–99)
Glucose-Capillary: 129 mg/dL — ABNORMAL HIGH (ref 70–99)

## 2021-04-24 LAB — PROTEIN / CREATININE RATIO, URINE
Creatinine, Urine: 76.94 mg/dL
Protein Creatinine Ratio: 1.03 mg/mg{Cre} — ABNORMAL HIGH (ref 0.00–0.15)
Total Protein, Urine: 79 mg/dL

## 2021-04-24 LAB — HEMOGLOBIN AND HEMATOCRIT, BLOOD
HCT: 30.3 % — ABNORMAL LOW (ref 39.0–52.0)
Hemoglobin: 9.9 g/dL — ABNORMAL LOW (ref 13.0–17.0)

## 2021-04-24 LAB — SODIUM, URINE, RANDOM: Sodium, Ur: 81 mmol/L

## 2021-04-24 MED ORDER — SODIUM BICARBONATE 650 MG PO TABS
1300.0000 mg | ORAL_TABLET | Freq: Three times a day (TID) | ORAL | Status: DC
  Administered 2021-04-24 – 2021-05-10 (×47): 1300 mg via ORAL
  Filled 2021-04-24 (×49): qty 2

## 2021-04-24 MED ORDER — POTASSIUM CHLORIDE CRYS ER 20 MEQ PO TBCR
20.0000 meq | EXTENDED_RELEASE_TABLET | Freq: Once | ORAL | Status: AC
  Administered 2021-04-24: 20 meq via ORAL
  Filled 2021-04-24: qty 1

## 2021-04-24 NOTE — Progress Notes (Signed)
PROGRESS NOTE  Todd [name]  IEP:[id number] DOB: Jan 18, 1956 DOA: 04/19/2021 PCP: [name], FNP  Brief Narrative: Todd [name] is a 65 y.o. male recently admitted from 01/10/2021 to 01/15/2021 due to chronic A. fib with RVR, with medical history significant for essential hypertension, hypothyroidism, type 2 diabetes mellitus, chronic atrial fibrillation, status post bilateral AKA, history of polysubstance abuse (alcohol, tobacco and cocaine) who presents to [facility] ED from [facility] via EMS due to shortness of breath.  He was noted to be hypoxic, in the 80% on room air at the SNF, neb treatment was provided and patient was placed on a supplemental oxygen via [place] at 2 LPM with minimal improvement.  EMS was activated and on arrival, O2 supplementation was increased to 4 LPM en route to the hospital.   Work-up revealed acute hypoxic respiratory failure, right-sided community-acquired pneumonia, mild pulmonary edema, elevated troponin, elevated BNP greater than 800, acute on chronic diastolic CHF, AKI, non-anion gap metabolic acidosis, and electrolyte abnormalities.  Echocardiogram revealed LV systolic dysfunction and monitoring detected RVR. Lasix was given with subsequently increasing creatinine. Antibiotics were continued, and broadened with worsening hypoxia and progressive R > L CXR infiltrates.   With treatment, respiratory status has improved, though atrial tachycardia and hypertension has been treated with assistance of cardiology. Renal impairment has developed with work up ongoing.   Assessment & Plan: Principal Problem:   CAP (community acquired pneumonia) Active Problems:   Essential hypertension   Acquired hypothyroidism   Elevated troponin   Acute respiratory failure with hypoxia (HCC)   Hyperglycemia due to diabetes mellitus (HCC)   Leukocytosis   Reactive thrombocytosis   S/P AKA (above knee amputation) bilateral (HCC)   AKI (acute kidney injury) ([place])   Atrial  fibrillation, chronic (HCC)   Hypomagnesemia   Elevated brain natriuretic peptide (BNP) level   Polysubstance abuse (HCC)   Noncompliance with medication regimen   Chronic diastolic CHF (congestive heart failure) (HCC)   Hypoalbuminemia due to protein-calorie malnutrition (HCC)   GERD (gastroesophageal reflux disease)  Acute hypoxic respiratory failure: Presumed to be due to CHF and right-sided pneumonia, COPD exacerbation also possible given previous reports of wheezing. Bronchitic changes on CXR with hazy ground glass appearance of right lung. Opacity on right advanced considerably on repeat 11/10. Quickly improved hypoxia on 11/11, now off oxygen. - Respiratory status is stable, transfer to telemetry 11/12.  PAF, multifocal atrial tachycardia: Recent admit for RVR.  - Continue metoprolol succinate, increased to 100mg  dose due to persistent MAT per cardiology. - Maintain K >4, Mg >2. Will give low dose K supp this AM since also correcting acidosis.   Acute on chronic HFrEF, HTN: LVEF 45-50% without RWMA, +LVH, normal IVC. Severe LAE, moderate RAE. BNP elevated.  - Cardiology consulted. Unable to continue diuresis/start SGLT2 with creatinine bump - Converted norvasc to bidil. Continue metoprolol succinate.   AKI, continues worsening with NAGMA: No abnormality on U/S, though did have trace perinephric fluid on right. Bland sediment with definite proteinuria 1.03. FENa not consistent with prerenal azotemia at 1.78%.  - Monitor since we stopped vancomycin, possible culprit. - Continue bicarbonate tabs, increase dose.   Demand myocardial ischemia: Mild troponin elevation with reassuring delta no anginal complaints.  - Defer w/u to cardiology  Right-sided CAP: Progressive infiltrates with hypoxia overnight into 11/10.  - WBC declining and PCT continues to be reassuring. Can DC vancomycin with low suspicion for MRSA pneumonia given abrupt improvement and negative MRSA PCR.  - Completed  azithromycin  x5 days. Will continue zosyn to complete 7 days  - Monitor culture data. Blood Cx's NGTD x4 days, no sputum culture obtained.  COPD with exacerbation:  - Given resolution of wheezing and severe steroid-induced hyperglycemia, steroids were stopped. We will continue nebs.   Uncontrolled T2DM with steroid-induced hyperglycemia: HbA1c 7.9%.  - Increased glargine insulin to 15u, give 3u TIDWC + SSI, remains at inpatient goal  Polysubstance abuse: Previously +alcohol, tobacco, cocaine.  - Cessation counseling. Monitor for withdrawal, continue nicotine patch.   Hypothyroidism:  - Repeat TSH is 0.315, last was 20.52 in September 2022 which was improved from 189 in July in setting of medication nonadherence. Free T4 is 1.12 which is the ULN. In setting of AFib/MAT, we did decrease synthroid dose 164mcg > 185mcg.   Anemia of chronic disease: Having a decline in hgb from admission, though all cell lines are down and hgb was above baseline on admission.  - Hemoglobin has stabilized somewhat near baseline.   HTN: Tx as above  PVD s/p bilateral AKA (2018-2019):  - Continue statin, anticoagulation  GERD:  - Continue PPI  Suspected malnutrition: Albumin is 1.8. - Dietitian consulted.   DVT prophylaxis: Eliquis Code Status: Full Family Communication: None at bedside Disposition Plan:  Status is: Inpatient  Remains inpatient appropriate because: requires treatment for acute CHF and pneumonia.   Consultants:  Cardiology  Procedures:  Echocardiogram  Antimicrobials: Ceftriaxone, azithromycin > vancomycin, zosyn.  Subjective: Again reporting no complaints. He's eating vigorously. Denies chest pain, palpitations, swelling, or even dyspnea now. Making normal urine subjectively. ?incompletely documented.  Objective: Vitals:   04/23/21 1940 04/23/21 2038 04/24/21 0406 04/24/21 0800  BP:  114/87 115/85 (!) 144/96  Pulse:  99 82 89  Resp:  16 19 (!) 23  Temp:  98.4 F (36.9  C) 98.4 F (36.9 C) 98.3 F (36.8 C)  TempSrc:  Oral Oral Oral  SpO2: 100% 99% 99% 95%  Weight:   69.4 kg   Height:        Intake/Output Summary (Last 24 hours) at 04/24/2021 1047 Last data filed at 04/24/2021 0600 Gross per 24 hour  Intake 746.77 ml  Output 300 ml  Net 446.77 ml   Filed Weights   04/22/21 0358 04/23/21 0500 04/24/21 0406  Weight: 70 kg 68.3 kg 69.4 kg   Gen: Chronically ill-appearing male in no distress Pulm: Nonlabored breathing room air. Slightly coarse on right.. CV: Regular tachycardia with frequent premature beats. No murmur, rub, or gallop. No JVD, no dependent edema at hips. GI: Abdomen soft, non-tender, non-distended, with normoactive bowel sounds.  Ext: Warm, stable s/p AKA's Skin: No new rashes, lesions or ulcers on visualized skin. Neuro: Alert and oriented. No focal neurological deficits. Psych: Judgement and insight appear fair. Mood euthymic & affect congruent. Behavior is appropriate.    Data Reviewed: I have personally reviewed following labs and imaging studies  CBC: Recent Labs  Lab 04/19/21 0019 04/20/21 1507 04/22/21 0207 04/23/21 0339 04/24/21 0701  WBC 17.5* 16.3* 22.1* 15.8*  --   NEUTROABS 13.6*  --   --  12.3*  --   HGB 11.3* 10.7* 10.1* 8.8* 9.9*  HCT 35.3* 32.4* 32.1* 27.8* 30.3*  MCV 89.6 86.9 87.7 88.8  --   PLT 725* 657* 677* 581*  --    Basic Metabolic Panel: Recent Labs  Lab 04/19/21 0019 04/20/21 0254 04/20/21 1507 04/21/21 0327 04/22/21 0207 04/23/21 0339 04/24/21 0326  NA 141  --  140  --  136 140  140  K 4.0  --  3.9  --  4.6 3.7 3.5  CL 115*  --  116*  --  111 114* 115*  CO2 18*  --  16*  --  14* 16* 15*  GLUCOSE 174*  --  153*  --  384* 226* 189*  BUN 26*  --  26*  --  43* 40* 42*  CREATININE 1.89*  --  1.69*  --  2.25* 2.42* 2.34*  CALCIUM 7.9*  --  7.9*  --  7.9* 7.2* 6.9*  MG  --  1.6*  --  2.5*  --   --   --   PHOS  --  3.8  --  5.1*  --  4.6 4.3   GFR: Estimated Creatinine Clearance: 30.4  mL/min (A) (by C-G formula based on SCr of 2.34 mg/dL (H)). Liver Function Tests: Recent Labs  Lab 04/19/21 0019 04/20/21 1507 04/22/21 0207 04/23/21 0339 04/24/21 0326  AST 19 17 16   --   --   ALT 21 18 17   --   --   ALKPHOS 136* 100 124  --   --   BILITOT 0.5 0.7 0.5  --   --   PROT 7.2 6.5 6.7  --   --   ALBUMIN 2.6* 2.4* 2.4* 1.8* 1.9*   No results for input(s): LIPASE, AMYLASE in the last 168 hours. No results for input(s): AMMONIA in the last 168 hours. Coagulation Profile: Recent Labs  Lab 04/20/21 0254  INR 1.4*   Cardiac Enzymes: No results for input(s): CKTOTAL, CKMB, CKMBINDEX, TROPONINI in the last 168 hours. BNP (last 3 results) No results for input(s): PROBNP in the last 8760 hours. HbA1C: No results for input(s): HGBA1C in the last 72 hours.  CBG: Recent Labs  Lab 04/23/21 0725 04/23/21 1112 04/23/21 1608 04/23/21 2131 04/24/21 0757  GLUCAP 158* 129* 124* 118* 94   Lipid Profile: No results for input(s): CHOL, HDL, LDLCALC, TRIG, CHOLHDL, LDLDIRECT in the last 72 hours. Thyroid Function Tests: Recent Labs    04/22/21 0207 04/22/21 1612  TSH 0.315*  --   FREET4  --  1.12   Anemia Panel: No results for input(s): VITAMINB12, FOLATE, FERRITIN, TIBC, IRON, RETICCTPCT in the last 72 hours. Urine analysis:    Component Value Date/Time   COLORURINE YELLOW 04/24/2021 0446   APPEARANCEUR CLEAR 04/24/2021 0446   APPEARANCEUR Clear 02/09/2018 1352   LABSPEC 1.018 04/24/2021 0446   PHURINE 5.0 04/24/2021 0446   GLUCOSEU NEGATIVE 04/24/2021 0446   HGBUR NEGATIVE 04/24/2021 0446   BILIRUBINUR NEGATIVE 04/24/2021 0446   BILIRUBINUR Negative 02/09/2018 1352   KETONESUR NEGATIVE 04/24/2021 0446   PROTEINUR 100 (A) 04/24/2021 0446   NITRITE NEGATIVE 04/24/2021 0446   LEUKOCYTESUR NEGATIVE 04/24/2021 0446   Recent Results (from the past 240 hour(s))  Resp Panel by RT-PCR (Flu A&B, Covid) Nasopharyngeal Swab     Status: None   Collection Time:  04/19/21 12:12 AM   Specimen: Nasopharyngeal Swab; Nasopharyngeal(NP) swabs in vial transport medium  Result Value Ref Range Status   SARS Coronavirus 2 by RT PCR NEGATIVE NEGATIVE Final    Comment: (NOTE) SARS-CoV-2 target nucleic acids are NOT DETECTED.  The SARS-CoV-2 RNA is generally detectable in upper respiratory specimens during the acute phase of infection. The lowest concentration of SARS-CoV-2 viral copies this assay can detect is 138 copies/mL. A negative result does not preclude SARS-Cov-2 infection and should not be used as the sole basis for treatment or other patient management  decisions. A negative result may occur with  improper specimen collection/handling, submission of specimen other than nasopharyngeal swab, presence of viral mutation(s) within the areas targeted by this assay, and inadequate number of viral copies(<138 copies/mL). A negative result must be combined with clinical observations, patient history, and epidemiological information. The expected result is Negative.  Fact Sheet for Patients:  EntrepreneurPulse.com.au  Fact Sheet for Healthcare Providers:  IncredibleEmployment.be  This test is no t yet approved or cleared by the [place] FDA and  has been authorized for detection and/or diagnosis of SARS-CoV-2 by FDA under an Emergency Use Authorization (EUA). This EUA will remain  in effect (meaning this test can be used) for the duration of the COVID-19 declaration under Section 564(b)(1) of the Act, 21 U.S.C.section 360bbb-3(b)(1), unless the authorization is terminated  or revoked sooner.       Influenza A by PCR NEGATIVE NEGATIVE Final   Influenza B by PCR NEGATIVE NEGATIVE Final    Comment: (NOTE) The Xpert Xpress SARS-CoV-2/FLU/RSV plus assay is intended as an aid in the diagnosis of influenza from Nasopharyngeal swab specimens and should not be used as a sole basis for treatment. Nasal washings  and aspirates are unacceptable for Xpert Xpress SARS-CoV-2/FLU/RSV testing.  Fact Sheet for Patients: EntrepreneurPulse.com.au  Fact Sheet for Healthcare Providers: IncredibleEmployment.be  This test is not yet approved or cleared by the [place] FDA and has been authorized for detection and/or diagnosis of SARS-CoV-2 by FDA under an Emergency Use Authorization (EUA). This EUA will remain in effect (meaning this test can be used) for the duration of the COVID-19 declaration under Section 564(b)(1) of the Act, 21 U.S.C. section 360bbb-3(b)(1), unless the authorization is terminated or revoked.  Performed at [facility], [address], [place], [place] [address]   Culture, blood (Routine X 2) w Reflex to ID Panel     Status: None (Preliminary result)   Collection Time: 04/20/21 12:52 AM   Specimen: BLOOD  Result Value Ref Range Status   Specimen Description BLOOD LEFT ANTECUBITAL  Final   Special Requests   Final    BOTTLES DRAWN AEROBIC AND ANAEROBIC Blood Culture adequate volume   Culture   Final    NO GROWTH 4 DAYS Performed at [facility], [address] [place], [place] [address]    Report Status PENDING  Incomplete  Culture, blood (Routine X 2) w Reflex to ID Panel     Status: None (Preliminary result)   Collection Time: 04/20/21 12:52 AM   Specimen: BLOOD  Result Value Ref Range Status   Specimen Description BLOOD BLOOD LEFT HAND  Final   Special Requests   Final    BOTTLES DRAWN AEROBIC AND ANAEROBIC Blood Culture adequate volume   Culture   Final    NO GROWTH 4 DAYS Performed at [facility], [address], [place], [place] [address]    Report Status PENDING  Incomplete  MRSA Next Gen by PCR, Nasal     Status: None   Collection Time: 04/22/21  4:40 PM   Specimen: Nasal Mucosa; Nasal Swab  Result Value Ref Range Status   MRSA by PCR Next Gen NOT DETECTED NOT DETECTED Final    Comment: (NOTE) The GeneXpert MRSA Assay  (FDA approved for NASAL specimens only), is one component of a comprehensive MRSA colonization surveillance program. It is not intended to diagnose MRSA infection nor to guide or monitor treatment for MRSA infections. Test performance is not FDA approved in patients less than 2 years  old. Performed at [facility], [address], [place], [place] [address]       Radiology Studies: No results found.  Scheduled Meds:  apixaban  5 mg Oral BID   atorvastatin  40 mg Oral Daily   dextromethorphan-guaiFENesin  1 tablet Oral BID   folic acid  1 mg Oral Daily   insulin aspart  0-5 Units Subcutaneous QHS   insulin aspart  0-9 Units Subcutaneous TID WC   insulin aspart  3 Units Subcutaneous TID WC   insulin glargine-yfgn  15 Units Subcutaneous Daily   ipratropium-albuterol  3 mL Nebulization BID   isosorbide-hydrALAZINE  1 tablet Oral TID   levothyroxine  100 mcg Oral QAC breakfast   megestrol  400 mg Oral BID   metoprolol succinate  100 mg Oral Daily   multivitamin with minerals  1 tablet Oral Daily   pantoprazole  40 mg Oral BID   sodium bicarbonate  650 mg Oral TID   thiamine  100 mg Oral Daily   Continuous Infusions:  sodium chloride Stopped (04/20/21 0916)   piperacillin-tazobactam (ZOSYN)  IV 3.375 g (04/24/21 0553)     LOS: 4 days   Time spent: 35 minutes.  Patrecia Pour, MD [facility] [email or web address] 04/24/2021, 10:47 AM

## 2021-04-24 NOTE — Progress Notes (Addendum)
Progress Note  Patient Name: Todd [name] Date of Encounter: 04/24/2021  [facility] Cardiologist: Minus Breeding, MD   Subjective   Per patient, had to have a breathing treatment earlier. No CP.   Inpatient Medications    Scheduled Meds:  apixaban  5 mg Oral BID   atorvastatin  40 mg Oral Daily   dextromethorphan-guaiFENesin  1 tablet Oral BID   folic acid  1 mg Oral Daily   insulin aspart  0-5 Units Subcutaneous QHS   insulin aspart  0-9 Units Subcutaneous TID WC   insulin aspart  3 Units Subcutaneous TID WC   insulin glargine-yfgn  15 Units Subcutaneous Daily   ipratropium-albuterol  3 mL Nebulization BID   isosorbide-hydrALAZINE  1 tablet Oral TID   levothyroxine  100 mcg Oral QAC breakfast   megestrol  400 mg Oral BID   metoprolol succinate  100 mg Oral Daily   multivitamin with minerals  1 tablet Oral Daily   pantoprazole  40 mg Oral BID   sodium bicarbonate  650 mg Oral TID   thiamine  100 mg Oral Daily   Continuous Infusions:  sodium chloride Stopped (04/20/21 0916)   piperacillin-tazobactam (ZOSYN)  IV 3.375 g (04/24/21 0553)   PRN Meds: sodium chloride, acetaminophen, ipratropium-albuterol, nicotine   Vital Signs    Vitals:   04/23/21 1940 04/23/21 2038 04/24/21 0406 04/24/21 0800  BP:  114/87 115/85 (!) 144/96  Pulse:  99 82 89  Resp:  16 19 (!) 23  Temp:  98.4 F (36.9 C) 98.4 F (36.9 C) 98.3 F (36.8 C)  TempSrc:  Oral Oral Oral  SpO2: 100% 99% 99% 95%  Weight:   69.4 kg   Height:        Intake/Output Summary (Last 24 hours) at 04/24/2021 0913 Last data filed at 04/24/2021 0600 Gross per 24 hour  Intake 746.77 ml  Output 300 ml  Net 446.77 ml   Last 3 Weights 04/24/2021 04/23/2021 04/22/2021  Weight (lbs) 153 lb 150 lb 9.2 oz 154 lb 6.4 oz  Weight (kg) 69.4 kg 68.3 kg 70.035 kg      Telemetry    NSR with very frequent PACs and single 15 beats run of NSVT yesterday - Personally Reviewed  ECG    NSR with PACs - Personally  Reviewed  Physical Exam   GEN: No acute distress.   Neck: No JVD Cardiac: RRR, no murmurs, rubs, or gallops.  Respiratory: Clear to auscultation bilaterally. GI: Soft, nontender, non-distended  MS: R BKA and L AKA  Neuro:  Nonfocal  Psych: Normal affect   Labs    High Sensitivity Troponin:   Recent Labs  Lab 04/19/21 0019 04/20/21 0254 04/20/21 0502 04/20/21 0639  TROPONINIHS 52* 55* 55* 56*     Chemistry Recent Labs  Lab 04/19/21 0019 04/20/21 0254 04/20/21 1507 04/21/21 0327 04/22/21 0207 04/23/21 0339 04/24/21 0326  NA 141  --  140  --  136 140 140  K 4.0  --  3.9  --  4.6 3.7 3.5  CL 115*  --  116*  --  111 114* 115*  CO2 18*  --  16*  --  14* 16* 15*  GLUCOSE 174*  --  153*  --  384* 226* 189*  BUN 26*  --  26*  --  43* 40* 42*  CREATININE 1.89*  --  1.69*  --  2.25* 2.42* 2.34*  CALCIUM 7.9*  --  7.9*  --  7.9* 7.2* 6.9*  MG  --  1.6*  --  2.5*  --   --   --   PROT 7.2  --  6.5  --  6.7  --   --   ALBUMIN 2.6*  --  2.4*  --  2.4* 1.8* 1.9*  AST 19  --  17  --  16  --   --   ALT 21  --  18  --  17  --   --   ALKPHOS 136*  --  100  --  124  --   --   BILITOT 0.5  --  0.7  --  0.5  --   --   GFRNONAA 39*  --  44*  --  32* 29* 30*  ANIONGAP 8  --  8  --  11 10 10     Lipids No results for input(s): CHOL, TRIG, HDL, LABVLDL, LDLCALC, CHOLHDL in the last 168 hours.  Hematology Recent Labs  Lab 04/20/21 1507 04/22/21 0207 04/23/21 0339 04/24/21 0701  WBC 16.3* 22.1* 15.8*  --   RBC 3.73* 3.66* 3.13*  --   HGB 10.7* 10.1* 8.8* 9.9*  HCT 32.4* 32.1* 27.8* 30.3*  MCV 86.9 87.7 88.8  --   MCH 28.7 27.6 28.1  --   MCHC 33.0 31.5 31.7  --   RDW 16.4* 16.3* 16.3*  --   PLT 657* 677* 581*  --    Thyroid  Recent Labs  Lab 04/22/21 0207 04/22/21 1612  TSH 0.315*  --   FREET4  --  1.12    BNP Recent Labs  Lab 04/19/21 0019  BNP 859.0*    DDimer No results for input(s): DDIMER in the last 168 hours.   Radiology    No results found.  Cardiac  Studies   Limited echo 04/20/2021  1. Left ventricular ejection fraction, by estimation, is 45 to 50%. The  left ventricle has mildly decreased function. There is moderate left  ventricular hypertrophy.   2. Right ventricular systolic function is normal. The right ventricular  size is normal.   3. Left atrial size was severely dilated.   4. Right atrial size was moderately dilated.   5. The inferior vena cava is normal in size with greater than 50%  respiratory variability, suggesting right atrial pressure of 3 mmHg.   6. Limited echo evaluate LV function.   Patient Profile     65 y.o. male with PMH of PAF, mild cardiomyopathy, DM II, CKD stage II, hypothyroidism with poor compliance and polysubstance abuse (cocaine, alcohol, tobacco) who is being seen 04/21/2021 for the evaluation of CHF  Assessment & Plan    Acute on chronic systolic heart failure  - Echo 04/20/2021 showed EF 45-50%, moderate LVH, severe LAE, moderate RAE  - likely ischemic cardiomyopathy, however felt to be high risk for cath and recommend medical management.   - lasix held due to worsening renal function. Currently appears to be euvolemic.   - continue Bidil and toprol XL. Home amlodipine stopped  PAF: on Eliquis. On metoprolol succinate 100mg  daily.   Presumed CAD: previous echo in 2018 showed EF 45-50% with hypokinesis of the basal-mid inferior and basal-mid anteroseptal myocardium.   HLD  DM II  AKI: Cr peaked at 2.42 --> now down to 2.34. Cr 1.0 - 1.1 in August   R BKA and L AKA in 2018      For questions or updates, please contact [place] Please consult [email or web address] for contact info under  Hilbert Corrigan, PA  04/24/2021, 9:13 AM    Personally seen and examined. Agree with above.  65 year old with polysubstance use cocaine alcohol tobacco diabetes chronic kidney disease stage II with acute kidney injury mild systolic heart failure/cardiomyopathy EF 45 to 50% with paroxysmal  atrial fibrillation poor medication compliance.  Acute on chronic systolic heart failure - Echo 45 to 50% EF.  Moderate LVH.  Dilated left atrium.  Likely ischemic however agree that he is too high risk for heart catheterization recommend medical management ongoing.  Agree with Dr. [name]. - Lasix currently being held because of worsening renal function.  Euvolemic now.  Continue with BiDil Toprol-XL.  Home amlodipine was stopped.  Paroxysmal atrial relation - Continue with Eliquis for chronic anticoagulation and stroke reduction.  On metoprolol succinate 100 mg daily.  Also helps with his underlying heart failure.  Presumed coronary artery disease - Prior echo in 2018 showed EF also of 45 to 50% with basal mid inferior and mid anterior septal myocardial hypokinesis.  Current echocardiogram similar.  Continue with medical management.  Severe peripheral vascular disease status post amputation right BKA left AKA in 2018.  Acute kidney injury - Creatinine peaked at 2.4 now 2.34.  It was just 1.0 in August.  Has diabetes and hyperlipidemia as well.  Continue to avoid nephrotoxic agents.  Candee Furbish, MD

## 2021-04-25 ENCOUNTER — Inpatient Hospital Stay (HOSPITAL_COMMUNITY): Payer: Medicare HMO

## 2021-04-25 DIAGNOSIS — I482 Chronic atrial fibrillation, unspecified: Secondary | ICD-10-CM | POA: Diagnosis not present

## 2021-04-25 DIAGNOSIS — I5032 Chronic diastolic (congestive) heart failure: Secondary | ICD-10-CM | POA: Diagnosis not present

## 2021-04-25 DIAGNOSIS — E039 Hypothyroidism, unspecified: Secondary | ICD-10-CM | POA: Diagnosis not present

## 2021-04-25 DIAGNOSIS — N179 Acute kidney failure, unspecified: Secondary | ICD-10-CM | POA: Diagnosis not present

## 2021-04-25 DIAGNOSIS — J9601 Acute respiratory failure with hypoxia: Secondary | ICD-10-CM | POA: Diagnosis not present

## 2021-04-25 DIAGNOSIS — J189 Pneumonia, unspecified organism: Secondary | ICD-10-CM | POA: Diagnosis not present

## 2021-04-25 LAB — RENAL FUNCTION PANEL
Albumin: 1.8 g/dL — ABNORMAL LOW (ref 3.5–5.0)
Anion gap: 10 (ref 5–15)
BUN: 33 mg/dL — ABNORMAL HIGH (ref 8–23)
CO2: 15 mmol/L — ABNORMAL LOW (ref 22–32)
Calcium: 6.6 mg/dL — ABNORMAL LOW (ref 8.9–10.3)
Chloride: 114 mmol/L — ABNORMAL HIGH (ref 98–111)
Creatinine, Ser: 2.12 mg/dL — ABNORMAL HIGH (ref 0.61–1.24)
GFR, Estimated: 34 mL/min — ABNORMAL LOW (ref >60)
Glucose, Bld: 62 mg/dL — ABNORMAL LOW (ref 70–99)
Phosphorus: 4.8 mg/dL — ABNORMAL HIGH (ref 2.5–4.6)
Potassium: 3.5 mmol/L (ref 3.5–5.1)
Sodium: 139 mmol/L (ref 135–145)

## 2021-04-25 LAB — CULTURE, BLOOD (ROUTINE X 2)
Culture: NO GROWTH
Culture: NO GROWTH
Special Requests: ADEQUATE
Special Requests: ADEQUATE

## 2021-04-25 LAB — GLUCOSE, CAPILLARY
Glucose-Capillary: 48 mg/dL — ABNORMAL LOW (ref 70–99)
Glucose-Capillary: 84 mg/dL (ref 70–99)
Glucose-Capillary: 103 mg/dL — ABNORMAL HIGH (ref 70–99)
Glucose-Capillary: 83 mg/dL (ref 70–99)
Glucose-Capillary: 160 mg/dL — ABNORMAL HIGH (ref 70–99)

## 2021-04-25 MED ORDER — INSULIN GLARGINE-YFGN 100 UNIT/ML ~~LOC~~ SOLN
10.0000 [IU] | Freq: Every day | SUBCUTANEOUS | Status: DC
Start: 2021-04-25 — End: 2021-04-25
  Filled 2021-04-25: qty 0.1

## 2021-04-25 MED ORDER — FUROSEMIDE 10 MG/ML IJ SOLN
40.0000 mg | Freq: Two times a day (BID) | INTRAMUSCULAR | Status: DC
  Administered 2021-04-25 – 2021-04-26 (×2): 40 mg via INTRAVENOUS
  Filled 2021-04-25 (×2): qty 4

## 2021-04-25 NOTE — Progress Notes (Signed)
Pt CBG @749  , was 48. Pt was asymptomatic but restless. CBG rechecked @825 , is 84 after giving him Orange juice 8 oz. Pt still complaining about being very hot, SOB. Pt is on 2L and maintaining SpO2 > 92%. He already got neb tx and mucinex. He seems very agitated. Keep monitoring.

## 2021-04-25 NOTE — Progress Notes (Signed)
Patient had 7 beats of Vtach per cardiac monitor. Assessed pt, noticed sleeping , easily awaken ,denies any discomforts, vs stable. Md on call dr Bridgett Larsson notified .  Patient prior to this episode had short burst of SVT 11 beats, while patient  sleeping ,asymtomatic ,above MD was also notified via text message.

## 2021-04-25 NOTE — Progress Notes (Addendum)
Progress Note  Patient Name: Todd [name] Date of Encounter: 04/25/2021  [facility] Cardiologist: Minus Breeding, MD   Subjective   Breathing is unchanged, no chest pain  Inpatient Medications    Scheduled Meds:  apixaban  5 mg Oral BID   atorvastatin  40 mg Oral Daily   dextromethorphan-guaiFENesin  1 tablet Oral BID   folic acid  1 mg Oral Daily   insulin aspart  0-5 Units Subcutaneous QHS   insulin aspart  0-9 Units Subcutaneous TID WC   insulin aspart  3 Units Subcutaneous TID WC   insulin glargine-yfgn  10 Units Subcutaneous Daily   ipratropium-albuterol  3 mL Nebulization BID   isosorbide-hydrALAZINE  1 tablet Oral TID   levothyroxine  100 mcg Oral QAC breakfast   metoprolol succinate  100 mg Oral Daily   multivitamin with minerals  1 tablet Oral Daily   pantoprazole  40 mg Oral BID   sodium bicarbonate  1,300 mg Oral TID   thiamine  100 mg Oral Daily   Continuous Infusions:  sodium chloride Stopped (04/20/21 0916)   piperacillin-tazobactam (ZOSYN)  IV 3.375 g (04/25/21 0616)   PRN Meds: sodium chloride, acetaminophen, ipratropium-albuterol, nicotine   Vital Signs    Vitals:   04/25/21 0042 04/25/21 0754 04/25/21 0805 04/25/21 0819  BP: 127/78  (!) 147/102   Pulse: 88  90 90  Resp: 18  (!) 22   Temp: 98.9 F (37.2 C)  97.7 F (36.5 C)   TempSrc: Oral  Oral   SpO2: 100% 93% 92%   Weight:      Height:        Intake/Output Summary (Last 24 hours) at 04/25/2021 0953 Last data filed at 04/24/2021 1600 Gross per 24 hour  Intake 291.25 ml  Output 500 ml  Net -208.75 ml   Last 3 Weights 04/24/2021 04/23/2021 04/22/2021  Weight (lbs) 153 lb 150 lb 9.2 oz 154 lb 6.4 oz  Weight (kg) 69.4 kg 68.3 kg 70.035 kg      Telemetry    Sinus rhythm with frequent PACs and occasional PVCs, single episode of 7 beats run of NSVT - Personally Reviewed  ECG    NSR with PACs, no significant ST-T wave changes - Personally Reviewed  Physical Exam   GEN:  No acute distress.   Neck: No JVD Cardiac: RRR, no murmurs, rubs, or gallops.  Respiratory: Clear to auscultation bilaterally. GI: Soft, nontender, non-distended  MS: No edema; R BKA and L AKA in 2018 Neuro:  Nonfocal  Psych: Normal affect   Labs    High Sensitivity Troponin:   Recent Labs  Lab 04/19/21 0019 04/20/21 0254 04/20/21 0502 04/20/21 0639  TROPONINIHS 52* 55* 55* 56*     Chemistry Recent Labs  Lab 04/19/21 0019 04/20/21 0254 04/20/21 1507 04/21/21 0327 04/22/21 0207 04/23/21 0339 04/24/21 0326 04/25/21 0200  NA 141  --  140  --  136 140 140 139  K 4.0  --  3.9  --  4.6 3.7 3.5 3.5  CL 115*  --  116*  --  111 114* 115* 114*  CO2 18*  --  16*  --  14* 16* 15* 15*  GLUCOSE 174*  --  153*  --  384* 226* 189* 62*  BUN 26*  --  26*  --  43* 40* 42* 33*  CREATININE 1.89*  --  1.69*  --  2.25* 2.42* 2.34* 2.12*  CALCIUM 7.9*  --  7.9*  --  7.9*  7.2* 6.9* 6.6*  MG  --  1.6*  --  2.5*  --   --   --   --   PROT 7.2  --  6.5  --  6.7  --   --   --   ALBUMIN 2.6*  --  2.4*  --  2.4* 1.8* 1.9* 1.8*  AST 19  --  17  --  16  --   --   --   ALT 21  --  18  --  17  --   --   --   ALKPHOS 136*  --  100  --  124  --   --   --   BILITOT 0.5  --  0.7  --  0.5  --   --   --   GFRNONAA 39*  --  44*  --  32* 29* 30* 34*  ANIONGAP 8  --  8  --  11 10 10 10     Lipids No results for input(s): CHOL, TRIG, HDL, LABVLDL, LDLCALC, CHOLHDL in the last 168 hours.  Hematology Recent Labs  Lab 04/20/21 1507 04/22/21 0207 04/23/21 0339 04/24/21 0701  WBC 16.3* 22.1* 15.8*  --   RBC 3.73* 3.66* 3.13*  --   HGB 10.7* 10.1* 8.8* 9.9*  HCT 32.4* 32.1* 27.8* 30.3*  MCV 86.9 87.7 88.8  --   MCH 28.7 27.6 28.1  --   MCHC 33.0 31.5 31.7  --   RDW 16.4* 16.3* 16.3*  --   PLT 657* 677* 581*  --    Thyroid  Recent Labs  Lab 04/22/21 0207 04/22/21 1612  TSH 0.315*  --   FREET4  --  1.12    BNP Recent Labs  Lab 04/19/21 0019  BNP 859.0*    DDimer No results for input(s):  DDIMER in the last 168 hours.   Radiology    DG CHEST PORT 1 VIEW  Result Date: 04/25/2021 CLINICAL DATA:  Acute respiratory failure EXAM: PORTABLE CHEST 1 VIEW COMPARISON:  Chest x-ray dated April 22, 2021 FINDINGS: Cardiac and mediastinal contours are unchanged. Increased right greater than left heterogeneous opacities. No large pleural effusion or evidence of pneumothorax. IMPRESSION: Increased right greater than left heterogeneous opacities, concerning for worsening edema multifocal infection. Electronically Signed   By: Yetta Glassman M.D.   On: 04/25/2021 09:40    Cardiac Studies   Limited echo 04/20/2021  1. Left ventricular ejection fraction, by estimation, is 45 to 50%. The  left ventricle has mildly decreased function. There is moderate left  ventricular hypertrophy.   2. Right ventricular systolic function is normal. The right ventricular  size is normal.   3. Left atrial size was severely dilated.   4. Right atrial size was moderately dilated.   5. The inferior vena cava is normal in size with greater than 50%  respiratory variability, suggesting right atrial pressure of 3 mmHg.   6. Limited echo evaluate LV function.   Patient Profile     65 y.o. male with PMH of PAF, mild cardiomyopathy, DM II, CKD stage II, hypothyroidism with poor compliance and polysubstance abuse (cocaine, alcohol, tobacco) who is being seen 04/21/2021 for the evaluation of CHF  Assessment & Plan    Acute on chronic systolic heart failure             - Echo 04/20/2021 showed EF 45-50%, moderate LVH, severe LAE, moderate RAE             -  likely ischemic cardiomyopathy, however felt to be high risk for cath and recommend medical management.              - lasix held due to worsening renal function.              - continue Bidil and toprol XL. Home amlodipine stopped   - notable crackles in the R lung and some rhonchi in the left lung. Renal function continue to improve. Will hold off on diuretic    PNA: completed azithromycin for 5 days, on zosyn  PAF: on Eliquis. On metoprolol succinate 100mg  daily.    Presumed CAD: previous echo in 2018 showed EF 45-50% with hypokinesis of the basal-mid inferior and basal-mid anteroseptal myocardium.    HLD   DM II   AKI: Cr peaked at 2.42 --> now down to 2.12. Cr 1.0 - 1.1 in August    R BKA and L AKA in 2018  Polysubstance abuse      For questions or updates, please contact [place] Please consult [email or web address] for contact info under        Signed, Almyra Deforest, [place]  04/25/2021, 9:53 AM    Personally seen and examined. Agree with above.  65 year old with severe peripheral vascular disease status post right and left leg amputations polysubstance abuse presumed CAD with pneumonia PAF and acute on chronic systolic heart failure with mildly reduced ejection fraction 45 to 50%.  Chest x-ray today looks worse.  Bilateral infiltrates noted possible edema.  With his chronic kidney disease/acute kidney injury, we have been holding his Lasix.  He received 1 dose over the past 7 days.  Given the worsening of the chest x-ray, we will go ahead and give him Lasix for 3 doses IV 40 twice daily.  Carefully continue to monitor renal function.  This seems to be slowly trending in the positive direction. Crackles heard on exam.  Candee Furbish, MD

## 2021-04-25 NOTE — Progress Notes (Signed)
Pharmacy Antibiotic Note  Todd [name] is a 65 y.o. male admitted on 04/19/2021 with  pneumonia.  Patient was initially treated with ceftriaxone and azithromycin but had worsened and broadened to vancomycin and Zosyn. MRSA swab was negative and vancomycin was discontinued. Pharmacy has been consulted for Zosyn dosing.  Patient today remains afebrile. WBC has improved to 15.8. SCr today is 2.12 and improved, though still elevated (CrCl 33.6 mL/min). No changes in dose today.  Plan: Continue Zosyn 3.375g IV q8h (4 hour infusion). Monitor renal function Monitor for signs of worsening infection Follow-up antibiotic length of therapy  Height: 5\' 8"  (172.7 cm) Weight: 69.4 kg (153 lb) IBW/kg (Calculated) : 68.4  Temp (24hrs), Avg:98.3 F (36.8 C), Min:97.7 F (36.5 C), Max:98.9 F (37.2 C)  Recent Labs  Lab 04/19/21 0019 04/20/21 0052 04/20/21 1507 04/22/21 0207 04/23/21 0339 04/24/21 0326 04/25/21 0200  WBC 17.5*  --  16.3* 22.1* 15.8*  --   --   CREATININE 1.89*  --  1.69* 2.25* 2.42* 2.34* 2.12*  LATICACIDVEN  --  1.7  --   --   --   --   --     Estimated Creatinine Clearance: 33.6 mL/min (A) (by C-G formula based on SCr of 2.12 mg/dL (H)).    Allergies  Allergen Reactions   Lisinopril Other (See Comments)    Hyperkalemia / Renal failure    Antimicrobials this admission: Azithromycin 500 mg daily 11/8>> 11/12 Ceftriaxone 1 g daily 11/8 >>11/10 Zosyn 3.375 g IV q 8h 11/10>>  Vancomycin 1500 mg 11/10 x1  Dose adjustments this admission: None  Microbiology results: 11/8 BCx: no growth 11/10 MRSA PCR: not detected  Thank you for involving pharmacy in this patient's care.  Elita Quick, PharmD PGY1 Ambulatory Care Pharmacy Resident 04/25/2021 12:47 PM  **Pharmacist phone directory can be found on [email or web address] listed under [place]**

## 2021-04-25 NOTE — Progress Notes (Signed)
PROGRESS NOTE  Todd [name]  PFX:[id number] DOB: 10-Mar-1956 DOA: 04/19/2021 PCP: [name], FNP  Brief Narrative: Todd [name] is a 65 y.o. male recently admitted from 01/10/2021 to 01/15/2021 due to chronic A. fib with RVR, with medical history significant for essential hypertension, hypothyroidism, type 2 diabetes mellitus, chronic atrial fibrillation, status post bilateral AKA, history of polysubstance abuse (alcohol, tobacco and cocaine) who presents to [facility] ED from [facility] via EMS due to shortness of breath.  He was noted to be hypoxic, in the 80% on room air at the SNF, neb treatment was provided and patient was placed on a supplemental oxygen via [place] at 2 LPM with minimal improvement.  EMS was activated and on arrival, O2 supplementation was increased to 4 LPM en route to the hospital.   Work-up revealed acute hypoxic respiratory failure, right-sided community-acquired pneumonia, mild pulmonary edema, elevated troponin, elevated BNP greater than 800, acute on chronic diastolic CHF, AKI, non-anion gap metabolic acidosis, and electrolyte abnormalities.  Echocardiogram revealed LV systolic dysfunction and monitoring detected RVR. Lasix was given with subsequently increasing creatinine. Antibiotics were continued, and broadened with worsening hypoxia and progressive R > L CXR infiltrates.   With treatment, respiratory status seemed to improve, though atrial tachycardia and hypertension has been treated with assistance of cardiology. Renal impairment developed with metabolic acidosis, though seems to be stabilizing.   On 11/13, patient's breathing again subjectively worsened, crackles developing, bilateral patchy infiltrates advancing on CXR. IV diuresis is initiated.   Assessment & Plan: Principal Problem:   CAP (community acquired pneumonia) Active Problems:   Essential hypertension   Acquired hypothyroidism   Elevated troponin   Acute respiratory failure with hypoxia  (HCC)   Hyperglycemia due to diabetes mellitus (HCC)   Leukocytosis   Reactive thrombocytosis   S/P AKA (above knee amputation) bilateral (HCC)   AKI (acute kidney injury) ([place])   Atrial fibrillation, chronic (HCC)   Hypomagnesemia   Elevated brain natriuretic peptide (BNP) level   Polysubstance abuse (HCC)   Noncompliance with medication regimen   Chronic diastolic CHF (congestive heart failure) (HCC)   Hypoalbuminemia due to protein-calorie malnutrition (HCC)   GERD (gastroesophageal reflux disease)  Acute hypoxic respiratory failure: Presumed to be due to CHF and right-sided pneumonia, COPD exacerbation also possible given previous reports of wheezing. Bronchitic changes on CXR with hazy ground glass appearance of right lung. Opacity on right advanced considerably on repeat 11/10 and patchy bilateral opacities (based on personal review and confirmed on radiologist interpretation) worsening on 11/13 with return of hypoxemia, crackles.  - Hypoxia worsened with crackles, no further wheezing. Strong suspicion for pulmonary edema, so will plan to give lasix and monitor very closely. Will d/w cardiology as well.   PAF, multifocal atrial tachycardia: Recent admit for RVR.  - Continue metoprolol succinate, increased to 100mg  dose due to persistent MAT per cardiology. - Maintain K >4, Mg >2. Will give low dose K supp this AM since also correcting acidosis.   Acute on chronic HFrEF, HTN: LVEF 45-50% without RWMA, +LVH, normal IVC. Severe LAE, moderate RAE. BNP elevated.  - Cardiology consulted. Unable to continue diuresis/start SGLT2 with creatinine bump - Converted norvasc to bidil. Continue metoprolol succinate.   AKI with NAGMA: No abnormality on U/S, though did have trace perinephric fluid on right. Bland sediment with definite proteinuria 1.03. FENa not consistent with prerenal azotemia at 1.78%. Note history of worse AKIs that were associated with worse acidosis in the past.  - Suspect  insult  was primarily related to vancomycin with slow steady improvement since discontinuing. Will DC zosyn as well after 6 days with no fever, reassuring PCT.  - With pulmonary edema, will need to give lasix, so will give low dose K and monitor BMP  - Continue bicarbonate tabs  Demand myocardial ischemia: Mild troponin elevation with reassuring delta no anginal complaints.  - Defer w/u to cardiology  Right-sided CAP: Progressive infiltrates with hypoxia overnight into 11/10. Abx coverage expanded, though infiltrates continue to increase despite improving WBC, reassuring PCT and adequate abx coverage. - Completed azithromycin x5 days. Stopped vanc w/nephrotoxicity and negative MRSA PCR. Complete 6 days of zosyn.   - Monitor culture data. Blood Cx's NGTD x5 days, no sputum culture obtained.  COPD with exacerbation:  - Given resolution of wheezing and severe steroid-induced hyperglycemia, steroids were stopped. We will continue nebs.   Uncontrolled T2DM with steroid-induced hyperglycemia: HbA1c 7.9%.  - Now that steroid effect waning, hypoglycemic. Will DC basal insulin with fasting hypoglycemia. Can continue TIDWC + SSI novolog.  Polysubstance abuse: Previously +alcohol, tobacco, cocaine.  - Cessation counseling. Monitor for withdrawal, continue nicotine patch.   Hypothyroidism: Repeat TSH is 0.315, last was 20.52 in September 2022 which was improved from 189 in July in setting of medication nonadherence. Free T4 is 1.12 which is the ULN.  - In setting of AFib/MAT, we did decrease synthroid dose 111mcg > 135mcg.   Anemia of chronic disease: Having a decline in hgb from admission, though all cell lines are down and hgb was above baseline on admission.  - Hemoglobin has stabilized somewhat near baseline.   HTN: Tx as above  PVD s/p bilateral AKA (2018-2019):  - Continue statin, anticoagulation  GERD:  - Continue PPI  Suspected malnutrition: Albumin is 1.8. - Dietitian consulted.    DVT prophylaxis: Eliquis Code Status: Full Family Communication: None at bedside Disposition Plan:  Status is: Inpatient  Remains inpatient appropriate because: requires treatment for acute CHF and pneumonia.   Consultants:  Cardiology  Procedures:  Echocardiogram  Antimicrobials: Ceftriaxone, azithromycin > vancomycin, zosyn.  Subjective: Felt sweaty and hot this morning, CBG low, improved with po intake with concomitant rise in CBG. Started feeling severely short of breath again last night, put back on oxygen, no wheezing. No chest pain currently.  Objective: Vitals:   04/25/21 0042 04/25/21 0754 04/25/21 0805 04/25/21 0819  BP: 127/78  (!) 147/102   Pulse: 88  90 90  Resp: 18  (!) 22   Temp: 98.9 F (37.2 C)  97.7 F (36.5 C)   TempSrc: Oral  Oral   SpO2: 100% 93% 92%   Weight:      Height:        Intake/Output Summary (Last 24 hours) at 04/25/2021 1027 Last data filed at 04/24/2021 1600 Gross per 24 hour  Intake 291.25 ml  Output 500 ml  Net -208.75 ml   Filed Weights   04/22/21 0358 04/23/21 0500 04/24/21 0406  Weight: 70 kg 68.3 kg 69.4 kg   Gen: 65 y.o. male in no distress Pulm: Notable increase in coarse sounds on right, crackles prominent at bases R > L.  CV: Regular rate, occasional tachycardia on monitor. No murmur, rub, or gallop. No definite JVD, no pitting dependent edema. GI: Abdomen soft, non-tender, non-distended, with normoactive bowel sounds.  Ext: Warm, AKAs Skin: No new rashes, lesions or ulcers on visualized skin. Neuro: Alert and oriented. No focal neurological deficits. Psych: Judgement and insight appear fair. Mood euthymic & affect  congruent. Behavior is appropriate.    Data Reviewed: I have personally reviewed following labs and imaging studies  CBC: Recent Labs  Lab 04/19/21 0019 04/20/21 1507 04/22/21 0207 04/23/21 0339 04/24/21 0701  WBC 17.5* 16.3* 22.1* 15.8*  --   NEUTROABS 13.6*  --   --  12.3*  --   HGB 11.3*  10.7* 10.1* 8.8* 9.9*  HCT 35.3* 32.4* 32.1* 27.8* 30.3*  MCV 89.6 86.9 87.7 88.8  --   PLT 725* 657* 677* 581*  --    Basic Metabolic Panel: Recent Labs  Lab 04/20/21 0254 04/20/21 1507 04/21/21 0327 04/22/21 0207 04/23/21 0339 04/24/21 0326 04/25/21 0200  NA  --  140  --  136 140 140 139  K  --  3.9  --  4.6 3.7 3.5 3.5  CL  --  116*  --  111 114* 115* 114*  CO2  --  16*  --  14* 16* 15* 15*  GLUCOSE  --  153*  --  384* 226* 189* 62*  BUN  --  26*  --  43* 40* 42* 33*  CREATININE  --  1.69*  --  2.25* 2.42* 2.34* 2.12*  CALCIUM  --  7.9*  --  7.9* 7.2* 6.9* 6.6*  MG 1.6*  --  2.5*  --   --   --   --   PHOS 3.8  --  5.1*  --  4.6 4.3 4.8*   GFR: Estimated Creatinine Clearance: 33.6 mL/min (A) (by C-G formula based on SCr of 2.12 mg/dL (H)). Liver Function Tests: Recent Labs  Lab 04/19/21 0019 04/20/21 1507 04/22/21 0207 04/23/21 0339 04/24/21 0326 04/25/21 0200  AST 19 17 16   --   --   --   ALT 21 18 17   --   --   --   ALKPHOS 136* 100 124  --   --   --   BILITOT 0.5 0.7 0.5  --   --   --   PROT 7.2 6.5 6.7  --   --   --   ALBUMIN 2.6* 2.4* 2.4* 1.8* 1.9* 1.8*   No results for input(s): LIPASE, AMYLASE in the last 168 hours. No results for input(s): AMMONIA in the last 168 hours. Coagulation Profile: Recent Labs  Lab 04/20/21 0254  INR 1.4*   Cardiac Enzymes: No results for input(s): CKTOTAL, CKMB, CKMBINDEX, TROPONINI in the last 168 hours. BNP (last 3 results) No results for input(s): PROBNP in the last 8760 hours. HbA1C: No results for input(s): HGBA1C in the last 72 hours.  CBG: Recent Labs  Lab 04/24/21 1142 04/24/21 1635 04/24/21 2130 04/25/21 0749 04/25/21 0825  GLUCAP 93 158* 129* 48* 84   Lipid Profile: No results for input(s): CHOL, HDL, LDLCALC, TRIG, CHOLHDL, LDLDIRECT in the last 72 hours. Thyroid Function Tests: Recent Labs    04/22/21 1612  FREET4 1.12   Anemia Panel: No results for input(s): VITAMINB12, FOLATE, FERRITIN,  TIBC, IRON, RETICCTPCT in the last 72 hours. Urine analysis:    Component Value Date/Time   COLORURINE YELLOW 04/24/2021 0446   APPEARANCEUR CLEAR 04/24/2021 0446   APPEARANCEUR Clear 02/09/2018 1352   LABSPEC 1.018 04/24/2021 0446   PHURINE 5.0 04/24/2021 0446   GLUCOSEU NEGATIVE 04/24/2021 0446   HGBUR NEGATIVE 04/24/2021 0446   BILIRUBINUR NEGATIVE 04/24/2021 0446   BILIRUBINUR Negative 02/09/2018 1352   KETONESUR NEGATIVE 04/24/2021 0446   PROTEINUR 100 (A) 04/24/2021 0446   NITRITE NEGATIVE 04/24/2021 0446   LEUKOCYTESUR NEGATIVE  04/24/2021 0446   Recent Results (from the past 240 hour(s))  Resp Panel by RT-PCR (Flu A&B, Covid) Nasopharyngeal Swab     Status: None   Collection Time: 04/19/21 12:12 AM   Specimen: Nasopharyngeal Swab; Nasopharyngeal(NP) swabs in vial transport medium  Result Value Ref Range Status   SARS Coronavirus 2 by RT PCR NEGATIVE NEGATIVE Final    Comment: (NOTE) SARS-CoV-2 target nucleic acids are NOT DETECTED.  The SARS-CoV-2 RNA is generally detectable in upper respiratory specimens during the acute phase of infection. The lowest concentration of SARS-CoV-2 viral copies this assay can detect is 138 copies/mL. A negative result does not preclude SARS-Cov-2 infection and should not be used as the sole basis for treatment or other patient management decisions. A negative result may occur with  improper specimen collection/handling, submission of specimen other than nasopharyngeal swab, presence of viral mutation(s) within the areas targeted by this assay, and inadequate number of viral copies(<138 copies/mL). A negative result must be combined with clinical observations, patient history, and epidemiological information. The expected result is Negative.  Fact Sheet for Patients:  EntrepreneurPulse.com.au  Fact Sheet for Healthcare Providers:  IncredibleEmployment.be  This test is no t yet approved or cleared by  the [place] FDA and  has been authorized for detection and/or diagnosis of SARS-CoV-2 by FDA under an Emergency Use Authorization (EUA). This EUA will remain  in effect (meaning this test can be used) for the duration of the COVID-19 declaration under Section 564(b)(1) of the Act, 21 U.S.C.section 360bbb-3(b)(1), unless the authorization is terminated  or revoked sooner.       Influenza A by PCR NEGATIVE NEGATIVE Final   Influenza B by PCR NEGATIVE NEGATIVE Final    Comment: (NOTE) The Xpert Xpress SARS-CoV-2/FLU/RSV plus assay is intended as an aid in the diagnosis of influenza from Nasopharyngeal swab specimens and should not be used as a sole basis for treatment. Nasal washings and aspirates are unacceptable for Xpert Xpress SARS-CoV-2/FLU/RSV testing.  Fact Sheet for Patients: EntrepreneurPulse.com.au  Fact Sheet for Healthcare Providers: IncredibleEmployment.be  This test is not yet approved or cleared by the [place] FDA and has been authorized for detection and/or diagnosis of SARS-CoV-2 by FDA under an Emergency Use Authorization (EUA). This EUA will remain in effect (meaning this test can be used) for the duration of the COVID-19 declaration under Section 564(b)(1) of the Act, 21 U.S.C. section 360bbb-3(b)(1), unless the authorization is terminated or revoked.  Performed at [facility], [address] [place], [place] [address]   Culture, blood (Routine X 2) w Reflex to ID Panel     Status: None   Collection Time: 04/20/21 12:52 AM   Specimen: BLOOD  Result Value Ref Range Status   Specimen Description BLOOD LEFT ANTECUBITAL  Final   Special Requests   Final    BOTTLES DRAWN AEROBIC AND ANAEROBIC Blood Culture adequate volume   Culture   Final    NO GROWTH 5 DAYS Performed at [facility], [address] [place], [place] [address]    Report Status 04/25/2021 FINAL  Final  Culture, blood (Routine X 2) w Reflex to ID  Panel     Status: None   Collection Time: 04/20/21 12:52 AM   Specimen: BLOOD  Result Value Ref Range Status   Specimen Description BLOOD BLOOD LEFT HAND  Final   Special Requests   Final    BOTTLES DRAWN AEROBIC AND ANAEROBIC Blood Culture adequate volume   Culture   Final  NO GROWTH 5 DAYS Performed at [facility], [address] [place], [place] [address]    Report Status 04/25/2021 FINAL  Final  MRSA Next Gen by PCR, Nasal     Status: None   Collection Time: 04/22/21  4:40 PM   Specimen: Nasal Mucosa; Nasal Swab  Result Value Ref Range Status   MRSA by PCR Next Gen NOT DETECTED NOT DETECTED Final    Comment: (NOTE) The GeneXpert MRSA Assay (FDA approved for NASAL specimens only), is one component of a comprehensive MRSA colonization surveillance program. It is not intended to diagnose MRSA infection nor to guide or monitor treatment for MRSA infections. Test performance is not FDA approved in patients less than 53 years old. Performed at [facility], [place] [address] [place], [place] [address]       Radiology Studies: DG CHEST PORT 1 VIEW  Result Date: 04/25/2021 CLINICAL DATA:  Acute respiratory failure EXAM: PORTABLE CHEST 1 VIEW COMPARISON:  Chest x-ray dated April 22, 2021 FINDINGS: Cardiac and mediastinal contours are unchanged. Increased right greater than left heterogeneous opacities. No large pleural effusion or evidence of pneumothorax. IMPRESSION: Increased right greater than left heterogeneous opacities, concerning for worsening edema multifocal infection. Electronically Signed   By: Yetta Glassman M.D.   On: 04/25/2021 09:40    Scheduled Meds:  apixaban  5 mg Oral BID   atorvastatin  40 mg Oral Daily   dextromethorphan-guaiFENesin  1 tablet Oral BID   folic acid  1 mg Oral Daily   insulin aspart  0-5 Units Subcutaneous QHS   insulin aspart  0-9 Units Subcutaneous TID WC   insulin aspart  3 Units Subcutaneous TID WC   insulin glargine-yfgn  10  Units Subcutaneous Daily   ipratropium-albuterol  3 mL Nebulization BID   isosorbide-hydrALAZINE  1 tablet Oral TID   levothyroxine  100 mcg Oral QAC breakfast   metoprolol succinate  100 mg Oral Daily   multivitamin with minerals  1 tablet Oral Daily   pantoprazole  40 mg Oral BID   sodium bicarbonate  1,300 mg Oral TID   thiamine  100 mg Oral Daily   Continuous Infusions:  sodium chloride Stopped (04/20/21 0916)   piperacillin-tazobactam (ZOSYN)  IV 3.375 g (04/25/21 0616)     LOS: 5 days   Time spent: 35 minutes.  Patrecia Pour, MD [facility] [email or web address] 04/25/2021, 10:27 AM

## 2021-04-26 ENCOUNTER — Inpatient Hospital Stay (HOSPITAL_COMMUNITY): Payer: Medicare HMO

## 2021-04-26 DIAGNOSIS — J9601 Acute respiratory failure with hypoxia: Secondary | ICD-10-CM | POA: Diagnosis not present

## 2021-04-26 DIAGNOSIS — E039 Hypothyroidism, unspecified: Secondary | ICD-10-CM | POA: Diagnosis not present

## 2021-04-26 DIAGNOSIS — J189 Pneumonia, unspecified organism: Secondary | ICD-10-CM | POA: Diagnosis not present

## 2021-04-26 DIAGNOSIS — N179 Acute kidney failure, unspecified: Secondary | ICD-10-CM | POA: Diagnosis not present

## 2021-04-26 LAB — RENAL FUNCTION PANEL
Albumin: 1.8 g/dL — ABNORMAL LOW (ref 3.5–5.0)
Anion gap: 12 (ref 5–15)
BUN: 31 mg/dL — ABNORMAL HIGH (ref 8–23)
CO2: 16 mmol/L — ABNORMAL LOW (ref 22–32)
Calcium: 6.9 mg/dL — ABNORMAL LOW (ref 8.9–10.3)
Chloride: 112 mmol/L — ABNORMAL HIGH (ref 98–111)
Creatinine, Ser: 2.19 mg/dL — ABNORMAL HIGH (ref 0.61–1.24)
GFR, Estimated: 33 mL/min — ABNORMAL LOW (ref >60)
Glucose, Bld: 104 mg/dL — ABNORMAL HIGH (ref 70–99)
Phosphorus: 6.3 mg/dL — ABNORMAL HIGH (ref 2.5–4.6)
Potassium: 3.4 mmol/L — ABNORMAL LOW (ref 3.5–5.1)
Sodium: 140 mmol/L (ref 135–145)

## 2021-04-26 LAB — GLUCOSE, CAPILLARY
Glucose-Capillary: 162 mg/dL — ABNORMAL HIGH (ref 70–99)
Glucose-Capillary: 116 mg/dL — ABNORMAL HIGH (ref 70–99)
Glucose-Capillary: 123 mg/dL — ABNORMAL HIGH (ref 70–99)
Glucose-Capillary: 152 mg/dL — ABNORMAL HIGH (ref 70–99)

## 2021-04-26 LAB — MAGNESIUM: Magnesium: 1.8 mg/dL (ref 1.7–2.4)

## 2021-04-26 MED ORDER — POTASSIUM CHLORIDE 10 MEQ/100ML IV SOLN
10.0000 meq | INTRAVENOUS | Status: AC
  Administered 2021-04-26 (×4): 10 meq via INTRAVENOUS
  Filled 2021-04-26 (×4): qty 100

## 2021-04-26 MED ORDER — POTASSIUM CHLORIDE CRYS ER 20 MEQ PO TBCR
40.0000 meq | EXTENDED_RELEASE_TABLET | Freq: Once | ORAL | Status: DC
  Filled 2021-04-26: qty 2

## 2021-04-26 MED ORDER — FUROSEMIDE 10 MG/ML IJ SOLN
120.0000 mg | Freq: Once | INTRAVENOUS | Status: DC
  Filled 2021-04-26: qty 12

## 2021-04-26 MED ORDER — FUROSEMIDE 10 MG/ML IJ SOLN
80.0000 mg | Freq: Once | INTRAMUSCULAR | Status: AC
  Administered 2021-04-26: 80 mg via INTRAVENOUS
  Filled 2021-04-26: qty 8

## 2021-04-26 NOTE — Progress Notes (Signed)
PROGRESS NOTE  Todd [name]  TKP:[id number] DOB: August 03, 1955 DOA: 04/19/2021 PCP: [name], FNP  Brief Narrative: Todd [name] is a 65 y.o. male recently admitted from 01/10/2021 to 01/15/2021 due to chronic A. fib with RVR, with medical history significant for essential hypertension, hypothyroidism, type 2 diabetes mellitus, chronic atrial fibrillation, status post bilateral AKA, history of polysubstance abuse (alcohol, tobacco and cocaine) who presents to [facility] ED from [facility] via EMS due to shortness of breath.  He was noted to be hypoxic, in the 80% on room air at the SNF, neb treatment was provided and patient was placed on a supplemental oxygen via [place] at 2 LPM with minimal improvement.  EMS was activated and on arrival, O2 supplementation was increased to 4 LPM en route to the hospital.   Work-up revealed acute hypoxic respiratory failure, right-sided community-acquired pneumonia, mild pulmonary edema, elevated troponin, elevated BNP greater than 800, acute on chronic diastolic CHF, AKI, non-anion gap metabolic acidosis, and electrolyte abnormalities.  Echocardiogram revealed LV systolic dysfunction and monitoring detected RVR. Lasix was given with subsequently increasing creatinine. Antibiotics were continued, and broadened with worsening hypoxia and progressive R > L CXR infiltrates.   With treatment, respiratory status seemed to improve, though atrial tachycardia and hypertension has been treated with assistance of cardiology. Renal impairment developed with metabolic acidosis, though seems to be stabilizing.   On 11/13, patient's breathing again subjectively worsened, crackles developing, bilateral patchy infiltrates advancing on CXR. IV diuresis was initiated.   11/14, CXR shows a fair amount of improvement after just a single dose of lasix, though patient is on BiPAP due to elevated WOB, hypoxia improved.   Assessment & Plan: Principal Problem:   CAP (community  acquired pneumonia) Active Problems:   Essential hypertension   Acquired hypothyroidism   Elevated troponin   Acute respiratory failure with hypoxia (HCC)   Hyperglycemia due to diabetes mellitus (HCC)   Leukocytosis   Reactive thrombocytosis   S/P AKA (above knee amputation) bilateral (HCC)   AKI (acute kidney injury) ([place])   Atrial fibrillation, chronic (HCC)   Hypomagnesemia   Elevated brain natriuretic peptide (BNP) level   Polysubstance abuse (HCC)   Noncompliance with medication regimen   Chronic diastolic CHF (congestive heart failure) (HCC)   Hypoalbuminemia due to protein-calorie malnutrition (HCC)   GERD (gastroesophageal reflux disease)  Acute hypoxic respiratory failure: Presumed to be due to CHF and right-sided pneumonia, COPD exacerbation also possible given previous reports of wheezing. Bronchitic changes on CXR with hazy ground glass appearance of right lung. Opacity on right advanced considerably on repeat 11/10 and patchy bilateral opacities (based on personal review and confirmed on radiologist interpretation) worsening on 11/13 with return of hypoxemia, crackles.  - Continue IV diuresis as CXR appears significantly improved patchy bilateral R > L opacities on my personal review this morning. This is despite being placed on BiPAP this AM. Lungs still sound wet. Will need diuresis regardless of renal function. - If respiratory status worsening, would check blood gas, consult PCCM. Return status to [facility].   PAF, multifocal atrial tachycardia: Recent admit for RVR.  - Continue metoprolol succinate, increased to 100mg  dose due to persistent MAT per cardiology. - Maintain K >4, Mg >2. Repeat K supplement with hypokalemia today, loop diuretic administration and correction of acidosis.    Acute on chronic HFrEF, HTN: LVEF 45-50% without RWMA, +LVH, normal IVC. Severe LAE, moderate RAE. BNP elevated.  - Cardiology consulted. Unable to continue diuresis/start SGLT2  with  creatinine bump - Converted norvasc to bidil. Continue metoprolol succinate.   AKI with NAGMA: No abnormality on U/S, though did have trace perinephric fluid on right. Bland sediment with definite proteinuria 1.03. FENa not consistent with prerenal azotemia at 1.78%. Note history of worse AKIs that were associated with worse acidosis in the past.  - Suspect insult was primarily related to vancomycin with slow steady improvement since discontinuing.  - Continue bicarbonate tabs - Monitor with diuresis.  Demand myocardial ischemia: Mild troponin elevation with reassuring delta no anginal complaints.  - Defer w/u to cardiology  Right-sided CAP: Progressive infiltrates with hypoxia overnight into 11/10. Abx coverage expanded, though infiltrates continue to increase despite improving WBC, reassuring PCT and adequate abx coverage. - Completed azithromycin x5 days. Stopped vanc w/nephrotoxicity and negative MRSA PCR. Complete 7 days of zosyn.   - Monitor culture data. Blood Cx's NGTD x5 days, no sputum culture obtained.  COPD with exacerbation:  - Given resolution of wheezing and severe steroid-induced hyperglycemia, steroids were stopped. We will continue nebs. Worsening respiratory status as discussed above is concomitant with CXR infiltrates and not wheezing.   Uncontrolled T2DM with steroid-induced hyperglycemia: HbA1c 7.9%.  - Now that steroid effect waning, hypoglycemic. Will DC basal insulin with fasting hypoglycemia. Can continue TIDWC + SSI novolog.  Polysubstance abuse: Previously +alcohol, tobacco, cocaine.  - Cessation counseling. Monitor for withdrawal, continue nicotine patch.   Hypothyroidism: Repeat TSH is 0.315, last was 20.52 in September 2022 which was improved from 189 in July in setting of medication nonadherence. Free T4 is 1.12 which is the ULN.  - In setting of AFib/MAT, we did decrease synthroid dose 159mcg > 130mcg.   Anemia of chronic disease: Having a decline in  hgb from admission, though all cell lines are down and hgb was above baseline on admission.  - Hemoglobin has stabilized somewhat near baseline.   HTN: Tx as above  PVD s/p bilateral AKA (2018-2019):  - Continue statin, anticoagulation  GERD:  - Continue PPI  Suspected malnutrition: Albumin is 1.8. - Dietitian consulted.   DVT prophylaxis: Eliquis Code Status: Full Family Communication: None at bedside Disposition Plan:  Status is: Inpatient, transfer to higher level of care - PCU  Remains inpatient appropriate because: requires treatment for acute CHF and pneumonia.   Consultants:  Cardiology  Procedures:  Echocardiogram  Antimicrobials: Ceftriaxone, azithromycin > vancomycin, zosyn.  Subjective: Developed increased rate and work of breathing this morning. Rapid response contacted, placed on BiPAP, then I was notified. He reports feeling better and stable, holds an "ok" sign with his hands. Denies chest pain. Has incontinence of bowel and bladder this morning, reports significant urine output subjectively since last night. No fever.  Objective: Vitals:   04/26/21 0650 04/26/21 0739 04/26/21 0945 04/26/21 0948  BP: 140/76 130/81 (!) 138/93   Pulse: 80 92 91 94  Resp: (!) 22 (!) 23 (!) 23 (!) 24  Temp: 99 F (37.2 C)  98.1 F (36.7 C)   TempSrc: Oral  Axillary   SpO2: 93% 96% 99% 100%  Weight: 69 kg     Height:        Intake/Output Summary (Last 24 hours) at 04/26/2021 1051 Last data filed at 04/26/2021 0654 Gross per 24 hour  Intake 473.98 ml  Output 1250 ml  Net -776.02 ml   Filed Weights   04/23/21 0500 04/24/21 0406 04/26/21 0650  Weight: 68.3 kg 69.4 kg 69 kg   Gen: 65 y.o. male in no distress Pulm: Nonlabored  with BiPAP, rate ~20, crackles anteriorly and laterally.   CV: Rapid slightly irregular without murmur, rub, or gallop. No JVD, no dependent edema at hips. GI: Abdomen soft, non-tender, non-distended, with normoactive bowel sounds.  Ext: Warm,  stable BL AKAs Skin: No new rashes, lesions or ulcers on visualized skin. Neuro: Alert and oriented. No focal neurological deficits. Psych: Judgement and insight appear fair. Mood anxious initially, now wnl & affect congruent. Behavior is appropriate.    Data Reviewed: I have personally reviewed following labs and imaging studies  CBC: Recent Labs  Lab 04/20/21 1507 04/22/21 0207 04/23/21 0339 04/24/21 0701  WBC 16.3* 22.1* 15.8*  --   NEUTROABS  --   --  12.3*  --   HGB 10.7* 10.1* 8.8* 9.9*  HCT 32.4* 32.1* 27.8* 30.3*  MCV 86.9 87.7 88.8  --   PLT 657* 677* 581*  --    Basic Metabolic Panel: Recent Labs  Lab 04/20/21 0254 04/20/21 1507 04/21/21 0327 04/22/21 0207 04/23/21 0339 04/24/21 0326 04/25/21 0200 04/26/21 0100  NA  --    < >  --  136 140 140 139 140  K  --    < >  --  4.6 3.7 3.5 3.5 3.4*  CL  --    < >  --  111 114* 115* 114* 112*  CO2  --    < >  --  14* 16* 15* 15* 16*  GLUCOSE  --    < >  --  384* 226* 189* 62* 104*  BUN  --    < >  --  43* 40* 42* 33* 31*  CREATININE  --    < >  --  2.25* 2.42* 2.34* 2.12* 2.19*  CALCIUM  --    < >  --  7.9* 7.2* 6.9* 6.6* 6.9*  MG 1.6*  --  2.5*  --   --   --   --   --   PHOS 3.8  --  5.1*  --  4.6 4.3 4.8* 6.3*   < > = values in this interval not displayed.   GFR: Estimated Creatinine Clearance: 32.5 mL/min (A) (by C-G formula based on SCr of 2.19 mg/dL (H)). Liver Function Tests: Recent Labs  Lab 04/20/21 1507 04/22/21 0207 04/23/21 0339 04/24/21 0326 04/25/21 0200 04/26/21 0100  AST 17 16  --   --   --   --   ALT 18 17  --   --   --   --   ALKPHOS 100 124  --   --   --   --   BILITOT 0.7 0.5  --   --   --   --   PROT 6.5 6.7  --   --   --   --   ALBUMIN 2.4* 2.4* 1.8* 1.9* 1.8* 1.8*   No results for input(s): LIPASE, AMYLASE in the last 168 hours. No results for input(s): AMMONIA in the last 168 hours. Coagulation Profile: Recent Labs  Lab 04/20/21 0254  INR 1.4*   Cardiac Enzymes: No results  for input(s): CKTOTAL, CKMB, CKMBINDEX, TROPONINI in the last 168 hours. BNP (last 3 results) No results for input(s): PROBNP in the last 8760 hours. HbA1C: No results for input(s): HGBA1C in the last 72 hours.  CBG: Recent Labs  Lab 04/25/21 0825 04/25/21 1155 04/25/21 1621 04/25/21 2141 04/26/21 0735  GLUCAP 84 103* 83 160* 162*   Lipid Profile: No results for input(s): CHOL, HDL, LDLCALC, TRIG, CHOLHDL,  LDLDIRECT in the last 72 hours. Thyroid Function Tests: No results for input(s): TSH, T4TOTAL, FREET4, T3FREE, THYROIDAB in the last 72 hours.  Anemia Panel: No results for input(s): VITAMINB12, FOLATE, FERRITIN, TIBC, IRON, RETICCTPCT in the last 72 hours. Urine analysis:    Component Value Date/Time   COLORURINE YELLOW 04/24/2021 0446   APPEARANCEUR CLEAR 04/24/2021 0446   APPEARANCEUR Clear 02/09/2018 1352   LABSPEC 1.018 04/24/2021 0446   PHURINE 5.0 04/24/2021 0446   GLUCOSEU NEGATIVE 04/24/2021 0446   HGBUR NEGATIVE 04/24/2021 0446   BILIRUBINUR NEGATIVE 04/24/2021 0446   BILIRUBINUR Negative 02/09/2018 1352   KETONESUR NEGATIVE 04/24/2021 0446   PROTEINUR 100 (A) 04/24/2021 0446   NITRITE NEGATIVE 04/24/2021 0446   LEUKOCYTESUR NEGATIVE 04/24/2021 0446   Recent Results (from the past 240 hour(s))  Resp Panel by RT-PCR (Flu A&B, Covid) Nasopharyngeal Swab     Status: None   Collection Time: 04/19/21 12:12 AM   Specimen: Nasopharyngeal Swab; Nasopharyngeal(NP) swabs in vial transport medium  Result Value Ref Range Status   SARS Coronavirus 2 by RT PCR NEGATIVE NEGATIVE Final    Comment: (NOTE) SARS-CoV-2 target nucleic acids are NOT DETECTED.  The SARS-CoV-2 RNA is generally detectable in upper respiratory specimens during the acute phase of infection. The lowest concentration of SARS-CoV-2 viral copies this assay can detect is 138 copies/mL. A negative result does not preclude SARS-Cov-2 infection and should not be used as the sole basis for treatment  or other patient management decisions. A negative result may occur with  improper specimen collection/handling, submission of specimen other than nasopharyngeal swab, presence of viral mutation(s) within the areas targeted by this assay, and inadequate number of viral copies(<138 copies/mL). A negative result must be combined with clinical observations, patient history, and epidemiological information. The expected result is Negative.  Fact Sheet for Patients:  EntrepreneurPulse.com.au  Fact Sheet for Healthcare Providers:  IncredibleEmployment.be  This test is no t yet approved or cleared by the [place] FDA and  has been authorized for detection and/or diagnosis of SARS-CoV-2 by FDA under an Emergency Use Authorization (EUA). This EUA will remain  in effect (meaning this test can be used) for the duration of the COVID-19 declaration under Section 564(b)(1) of the Act, 21 U.S.C.section 360bbb-3(b)(1), unless the authorization is terminated  or revoked sooner.       Influenza A by PCR NEGATIVE NEGATIVE Final   Influenza B by PCR NEGATIVE NEGATIVE Final    Comment: (NOTE) The Xpert Xpress SARS-CoV-2/FLU/RSV plus assay is intended as an aid in the diagnosis of influenza from Nasopharyngeal swab specimens and should not be used as a sole basis for treatment. Nasal washings and aspirates are unacceptable for Xpert Xpress SARS-CoV-2/FLU/RSV testing.  Fact Sheet for Patients: EntrepreneurPulse.com.au  Fact Sheet for Healthcare Providers: IncredibleEmployment.be  This test is not yet approved or cleared by the [place] FDA and has been authorized for detection and/or diagnosis of SARS-CoV-2 by FDA under an Emergency Use Authorization (EUA). This EUA will remain in effect (meaning this test can be used) for the duration of the COVID-19 declaration under Section 564(b)(1) of the Act, 21 U.S.C. section  360bbb-3(b)(1), unless the authorization is terminated or revoked.  Performed at [facility], [address], [place], [place] [address]   Culture, blood (Routine X 2) w Reflex to ID Panel     Status: None   Collection Time: 04/20/21 12:52 AM   Specimen: BLOOD  Result Value Ref Range Status   Specimen Description BLOOD  LEFT ANTECUBITAL  Final   Special Requests   Final    BOTTLES DRAWN AEROBIC AND ANAEROBIC Blood Culture adequate volume   Culture   Final    NO GROWTH 5 DAYS Performed at [facility], [address], [place], [place] [address]    Report Status 04/25/2021 FINAL  Final  Culture, blood (Routine X 2) w Reflex to ID Panel     Status: None   Collection Time: 04/20/21 12:52 AM   Specimen: BLOOD  Result Value Ref Range Status   Specimen Description BLOOD BLOOD LEFT HAND  Final   Special Requests   Final    BOTTLES DRAWN AEROBIC AND ANAEROBIC Blood Culture adequate volume   Culture   Final    NO GROWTH 5 DAYS Performed at [facility], [address] [place], [place] [address]    Report Status 04/25/2021 FINAL  Final  MRSA Next Gen by PCR, Nasal     Status: None   Collection Time: 04/22/21  4:40 PM   Specimen: Nasal Mucosa; Nasal Swab  Result Value Ref Range Status   MRSA by PCR Next Gen NOT DETECTED NOT DETECTED Final    Comment: (NOTE) The GeneXpert MRSA Assay (FDA approved for NASAL specimens only), is one component of a comprehensive MRSA colonization surveillance program. It is not intended to diagnose MRSA infection nor to guide or monitor treatment for MRSA infections. Test performance is not FDA approved in patients less than 18 years old. Performed at [facility], [place] [address], [place], [place] [address]       Radiology Studies: DG CHEST PORT 1 VIEW  Result Date: 04/26/2021 CLINICAL DATA:  Acute respiratory failure with hypoxia. EXAM: PORTABLE CHEST 1 VIEW COMPARISON:  04/25/2021 FINDINGS: Heart size is accentuated by technique and stable.  Patchy infiltrates are again identified throughout the lungs, some interval improvement in aeration. RIGHT pleural effusion is small. IMPRESSION: Slightly improved aeration. Electronically Signed   By: Nolon Nations M.D.   On: 04/26/2021 10:42   DG CHEST PORT 1 VIEW  Result Date: 04/25/2021 CLINICAL DATA:  Acute respiratory failure EXAM: PORTABLE CHEST 1 VIEW COMPARISON:  Chest x-ray dated April 22, 2021 FINDINGS: Cardiac and mediastinal contours are unchanged. Increased right greater than left heterogeneous opacities. No large pleural effusion or evidence of pneumothorax. IMPRESSION: Increased right greater than left heterogeneous opacities, concerning for worsening edema multifocal infection. Electronically Signed   By: Yetta Glassman M.D.   On: 04/25/2021 09:40    Scheduled Meds:  apixaban  5 mg Oral BID   atorvastatin  40 mg Oral Daily   dextromethorphan-guaiFENesin  1 tablet Oral BID   folic acid  1 mg Oral Daily   furosemide  80 mg Intravenous Once   insulin aspart  0-5 Units Subcutaneous QHS   insulin aspart  0-9 Units Subcutaneous TID WC   insulin aspart  3 Units Subcutaneous TID WC   ipratropium-albuterol  3 mL Nebulization BID   isosorbide-hydrALAZINE  1 tablet Oral TID   levothyroxine  100 mcg Oral QAC breakfast   metoprolol succinate  100 mg Oral Daily   multivitamin with minerals  1 tablet Oral Daily   pantoprazole  40 mg Oral BID   sodium bicarbonate  1,300 mg Oral TID   thiamine  100 mg Oral Daily   Continuous Infusions:  sodium chloride Stopped (04/20/21 0916)   piperacillin-tazobactam (ZOSYN)  IV 3.375 g (04/26/21 0634)   potassium chloride       LOS: 6  days   Time spent: 35 minutes.  Patrecia Pour, MD [facility] [email or web address] 04/26/2021, 10:51 AM

## 2021-04-26 NOTE — Progress Notes (Signed)
Pt has PRN bipap orders, no distress noted at this time. 

## 2021-04-26 NOTE — Progress Notes (Addendum)
Progress Note  Patient Name: Todd [name] Date of Encounter: 04/26/2021  [facility] Cardiologist: Minus Breeding, MD   Subjective   No acute overnight events. Patient is still have significant shortness of breath and requested a breathing treatment as soon as I walked in his room. He does not fell like he has had any significant improvement in his breathing since being here. He does report relief after a breathing treatment but then shortness of breath returns. He denies any chest pain or palpitations. He does feel like he had increased urinary output after IV Lasix was restarted yesterday.  Inpatient Medications    Scheduled Meds:  apixaban  5 mg Oral BID   atorvastatin  40 mg Oral Daily   dextromethorphan-guaiFENesin  1 tablet Oral BID   folic acid  1 mg Oral Daily   furosemide  40 mg Intravenous BID   insulin aspart  0-5 Units Subcutaneous QHS   insulin aspart  0-9 Units Subcutaneous TID WC   insulin aspart  3 Units Subcutaneous TID WC   ipratropium-albuterol  3 mL Nebulization BID   isosorbide-hydrALAZINE  1 tablet Oral TID   levothyroxine  100 mcg Oral QAC breakfast   metoprolol succinate  100 mg Oral Daily   multivitamin with minerals  1 tablet Oral Daily   pantoprazole  40 mg Oral BID   sodium bicarbonate  1,300 mg Oral TID   thiamine  100 mg Oral Daily   Continuous Infusions:  sodium chloride Stopped (04/20/21 0916)   piperacillin-tazobactam (ZOSYN)  IV 3.375 g (04/26/21 0634)   PRN Meds: sodium chloride, acetaminophen, ipratropium-albuterol, nicotine   Vital Signs    Vitals:   04/25/21 2129 04/25/21 2135 04/26/21 0009 04/26/21 0650  BP:  133/83 127/83 140/76  Pulse:  93 84 80  Resp:  17 19 (!) 22  Temp:  98.2 F (36.8 C) 99.3 F (37.4 C) 99 F (37.2 C)  TempSrc:  Oral Oral Oral  SpO2: 98% 93% 99% 93%  Weight:    69 kg  Height:        Intake/Output Summary (Last 24 hours) at 04/26/2021 0722 Last data filed at 04/26/2021 0654 Gross per 24  hour  Intake 473.98 ml  Output 1250 ml  Net -776.02 ml   Last 3 Weights 04/26/2021 04/24/2021 04/23/2021  Weight (lbs) 152 lb 1.9 oz 153 lb 150 lb 9.2 oz  Weight (kg) 69 kg 69.4 kg 68.3 kg      Telemetry    Normal sinus rhythm with rates in the 80s to 90s. Frequent ectopy noted including PAC, PVC, short runs of NSVT (longest run 7 beats), and short of what looks like PAT. - Personally Reviewed  ECG    No new ECG tracing today. - Personally Reviewed  Physical Exam   GEN: No acute distress.   Neck: No JVD. Cardiac: Irregular rhythm with normal rate. Heart sounds difficult to assess over lung sounds but no significant murmurs, gallops, or rubs appreciated. Respiratory: Coarse breath sounds throughout that clear some with coughing. Crackles noted in bilateral bases (right > left). GI: Soft, non-distended, and non-tender. MS: No lower extremity edema. S/p bilateral above knee amputations.  Skin: Warm and dry. Neuro:  No focal deficits. Psych: Normal affect. Responds appropriately.  Labs    High Sensitivity Troponin:   Recent Labs  Lab 04/19/21 0019 04/20/21 0254 04/20/21 0502 04/20/21 0639  TROPONINIHS 52* 55* 55* 56*     Chemistry Recent Labs  Lab 04/20/21 0254 04/20/21 1507 04/20/21  1507 04/21/21 0327 04/22/21 0207 04/23/21 0339 04/24/21 0326 04/25/21 0200 04/26/21 0100  NA  --  140   < >  --  136   < > 140 139 140  K  --  3.9   < >  --  4.6   < > 3.5 3.5 3.4*  CL  --  116*   < >  --  111   < > 115* 114* 112*  CO2  --  16*   < >  --  14*   < > 15* 15* 16*  GLUCOSE  --  153*   < >  --  384*   < > 189* 62* 104*  BUN  --  26*   < >  --  43*   < > 42* 33* 31*  CREATININE  --  1.69*   < >  --  2.25*   < > 2.34* 2.12* 2.19*  CALCIUM  --  7.9*   < >  --  7.9*   < > 6.9* 6.6* 6.9*  MG 1.6*  --   --  2.5*  --   --   --   --   --   PROT  --  6.5  --   --  6.7  --   --   --   --   ALBUMIN  --  2.4*   < >  --  2.4*   < > 1.9* 1.8* 1.8*  AST  --  17  --   --  16  --    --   --   --   ALT  --  18  --   --  17  --   --   --   --   ALKPHOS  --  100  --   --  124  --   --   --   --   BILITOT  --  0.7  --   --  0.5  --   --   --   --   GFRNONAA  --  44*   < >  --  32*   < > 30* 34* 33*  ANIONGAP  --  8   < >  --  11   < > 10 10 12    < > = values in this interval not displayed.    Lipids No results for input(s): CHOL, TRIG, HDL, LABVLDL, LDLCALC, CHOLHDL in the last 168 hours.  Hematology Recent Labs  Lab 04/20/21 1507 04/22/21 0207 04/23/21 0339 04/24/21 0701  WBC 16.3* 22.1* 15.8*  --   RBC 3.73* 3.66* 3.13*  --   HGB 10.7* 10.1* 8.8* 9.9*  HCT 32.4* 32.1* 27.8* 30.3*  MCV 86.9 87.7 88.8  --   MCH 28.7 27.6 28.1  --   MCHC 33.0 31.5 31.7  --   RDW 16.4* 16.3* 16.3*  --   PLT 657* 677* 581*  --    Thyroid  Recent Labs  Lab 04/22/21 0207 04/22/21 1612  TSH 0.315*  --   FREET4  --  1.12    BNPNo results for input(s): BNP, PROBNP in the last 168 hours.  DDimer No results for input(s): DDIMER in the last 168 hours.   Radiology    DG CHEST PORT 1 VIEW  Result Date: 04/25/2021 CLINICAL DATA:  Acute respiratory failure EXAM: PORTABLE CHEST 1 VIEW COMPARISON:  Chest x-ray dated April 22, 2021 FINDINGS: Cardiac and mediastinal contours are unchanged. Increased right  greater than left heterogeneous opacities. No large pleural effusion or evidence of pneumothorax. IMPRESSION: Increased right greater than left heterogeneous opacities, concerning for worsening edema multifocal infection. Electronically Signed   By: Yetta Glassman M.D.   On: 04/25/2021 09:40    Cardiac Studies   Limited Echocardiogram 05-19-21: Impressions:  1. Left ventricular ejection fraction, by estimation, is 45 to 50%. The  left ventricle has mildly decreased function. There is moderate left  ventricular hypertrophy.   2. Right ventricular systolic function is normal. The right ventricular  size is normal.   3. Left atrial size was severely dilated.   4. Right atrial  size was moderately dilated.   5. The inferior vena cava is normal in size with greater than 50%  respiratory variability, suggesting right atrial pressure of 3 mmHg.   6. Limited echo evaluate LV function.   Patient Profile     65 y.o. male with a history of presumed CAD, mild cardiomyopathy with normalization of EF on Echo in 12/2020, paroxysmal atrial fibrillation on Eliquis, PAD s/p bilateral AKA in 2018, hypertension, hyperlipidemia, type 2 diabetes mellitus, hypothyroidism, CKD stage III, polysubstance abuse (cocaine, alcohol, tobacco), and poor compliance who was admitted on 04/19/2021 with acute hypoxic respiratory failure secondary to community acquired pneumonia and CHF. Cardiology consulted for further evaluation of CHF.  Assessment & Plan    Acute on Chronic CHF with Mildly Reduced EF - BNP elevated at 859. - Initial chest x-ray showed mild ground glass opacity in the right thorax. Repeat chest x-ray on 11/13 showed increased right > left heterogenous opacities concerning for worsening edema vs multifocal infection.  - Echo showed LVEF of 45-50% with moderate LVH. - Due to worsening chest x-ray, patient was restarted on IV Lasix 40mg  twice daily last night with a plan for 3 doses. Net negative 1.9 L this admission. Renal function stable.  - No real improvement in shortness of breath. - Continue IV Lasix for now. - Continue Toprol-XL 100mg  daily. - Continue BiDil 20-37.5mg  three times daily. Of note, insurance will not cover BiDil at discharge. Can continue during admission but will need to prescribe isosorbide dinitrate and hydralazine separately at discharge. - No ACEi/ARB given renal function. - Continue daily weights, strict I/Os, and renal function. - Patient felt to likely have ischemic cardiomyopathy but felt to be high risk for cardiac catheterization so medical management has been recommended.  Elevated Troponin Presumed CAD - High-sensitivity troponin minimally elevated  and flat at 52 >> 55 >> 55 >> 56. Not consistent with ACS. - Previous Echo in 2019 showed LVEF of 45-50% with hypokinesis of the basal-mid inferior and basal-mid anteroseptal myocardium. EF normalized on repeat Echo but is now back down to 45-50%. He has presumed CAD but has never undergone ischemic evaluation. Considered hgh risk for cardiac catheterization so medical therapy has been recommended. - No chest pain. - No Aspirin given need for DOAC. - Continue beta-blocker and high-intensity statin.  Paroxysmal Atrial Fibrillation - Maintaining sinus rhythm but having frequent ectopy on telemetry. - Potassium 3.4 today. Repleted by primary team. - Will check Magnesium today. - Contineu Toprol-XL 100mg  daily. - Continue Eliquis 5mg  twice daily.  PAD  - S/p bilateral above knee amputations in 2018. - No Aspiring given need for DOAC. - Continue statin.  Hypertension - BP well controlled.  - Continue medications for CHF.  Hyperlipidemia - Continue Lipitor 40mg  daily.  Type 2 Diabetes Mellitus - Hemoglobin A1c 7.9. - Management per primary team.  Acute  on CKD Stage III - Creatinine 1.89 on admission and peaked at 2.42 on 11/11 and now trending down again. Stable at 2.19. - Continue to monitor closely.  Otherwise, per primary team: - Community acquired pneumonia: complete antibiotic therapy - COPD exacerbation - Polysubstance abuse - Hypothyroidism - Anemia of chronic disease - GERD - Malnutrition   For questions or updates, please contact [facility] Please consult [email or web address] for contact info under        Signed, Darreld Mclean, PA-C  04/26/2021, 7:22 AM    Patient seen and examined with Sande Rives PA-C.  Agree as above, with the following exceptions and changes as noted below. Requiring Bipap on my exam, but feels better after this was initiated. Gen: NAD, CV: RRR, no murmurs, Lungs: diffuse posterior crackles, Abd: soft, Extrem: bilateral AKA, Neuro/Psych:  alert and oriented x 3, normal mood and affect. All available labs, radiology testing, previous records reviewed. Will give large bolus of lasix 120 mg IV now once to see if respiratory status improves. Will reasses renal function and diuretic need tomorrow for continued management.   Elouise Munroe, MD 04/26/21 11:46 AM

## 2021-04-26 NOTE — Plan of Care (Signed)

## 2021-04-26 NOTE — Care Management Important Message (Signed)
Important Message  Patient Details  Name: Todd [name] MRN: [id number] Date of Birth: [birth year]   Medicare Important Message Given:  Yes     [name] 04/26/2021, 10:58 AM

## 2021-04-26 NOTE — Significant Event (Signed)
Rapid Response Event Note   Reason for Call :  Increased work of breathing  Initial Focused Assessment:  Pt lying in bed, AO. Skin is warm, dry, pink. Lung sounds are diminished throughout. Pt endorses difficulty breathing. Accessory muscle use noted.   Pt received IV Lasix 40 mg this morning, has had roughly 200-300 cc UOP.  VS: T 98.7, BP 138/93, HR 94, RR 23, SpO2 199% on 100% NRB  Interventions:  -BiPAP 10/5 60%  Plan of Care:  -BiPAP x 2-4 hours, trial off BiPAP if work of breathing has improved  Call rapid response for additional needs  Event Summary:  MD Notified: RN to notify Dr. Bonner Puna and cardiology Call Time: [place] Time: [id number] End Time: [place], RN

## 2021-04-26 NOTE — Progress Notes (Signed)
RT note- Patient remains on Bipap, tolerating well, fio2 decreased to 40%, RN states that 80 more of lasix has been ordered. Continue to monitor for weaning off Bipap.

## 2021-04-26 NOTE — Progress Notes (Signed)
RT note-Seeing patient for routine therapy, very short of breath, HR 115, BBS coarse crackles. RN stated that she had just given lasix. Nebulizer given, patient still very anxious and stating he was short of breath with sp02 85%. NRB placed, rapid response called, Bipap ordered and placed on patient. Tolerating well at this time, titrate fio2 as tolerated. Continue to monitor.

## 2021-04-27 LAB — CBC WITH DIFFERENTIAL/PLATELET
Abs Immature Granulocytes: 0.16 10*3/uL — ABNORMAL HIGH (ref 0.00–0.07)
Basophils Absolute: 0.1 10*3/uL (ref 0.0–0.1)
Basophils Relative: 0 %
Eosinophils Absolute: 0.1 10*3/uL (ref 0.0–0.5)
Eosinophils Relative: 1 %
HCT: 28.1 % — ABNORMAL LOW (ref 39.0–52.0)
Hemoglobin: 9 g/dL — ABNORMAL LOW (ref 13.0–17.0)
Immature Granulocytes: 1 %
Lymphocytes Relative: 10 %
Lymphs Abs: 1.6 10*3/uL (ref 0.7–4.0)
MCH: 27.9 pg (ref 26.0–34.0)
MCHC: 32 g/dL (ref 30.0–36.0)
MCV: 87 fL (ref 80.0–100.0)
Monocytes Absolute: 1.1 10*3/uL — ABNORMAL HIGH (ref 0.1–1.0)
Monocytes Relative: 7 %
Neutro Abs: 12.8 10*3/uL — ABNORMAL HIGH (ref 1.7–7.7)
Neutrophils Relative %: 81 %
Platelets: 571 10*3/uL — ABNORMAL HIGH (ref 150–400)
RBC: 3.23 MIL/uL — ABNORMAL LOW (ref 4.22–5.81)
RDW: 15.7 % — ABNORMAL HIGH (ref 11.5–15.5)
WBC: 15.8 10*3/uL — ABNORMAL HIGH (ref 4.0–10.5)
nRBC: 0 % (ref 0.0–0.2)

## 2021-04-27 LAB — RENAL FUNCTION PANEL
Albumin: 1.8 g/dL — ABNORMAL LOW (ref 3.5–5.0)
Anion gap: 13 (ref 5–15)
BUN: 35 mg/dL — ABNORMAL HIGH (ref 8–23)
CO2: 16 mmol/L — ABNORMAL LOW (ref 22–32)
Calcium: 6.7 mg/dL — ABNORMAL LOW (ref 8.9–10.3)
Chloride: 109 mmol/L (ref 98–111)
Creatinine, Ser: 2.59 mg/dL — ABNORMAL HIGH (ref 0.61–1.24)
GFR, Estimated: 26 mL/min — ABNORMAL LOW
Glucose, Bld: 143 mg/dL — ABNORMAL HIGH (ref 70–99)
Phosphorus: 6.2 mg/dL — ABNORMAL HIGH (ref 2.5–4.6)
Potassium: 3.4 mmol/L — ABNORMAL LOW (ref 3.5–5.1)
Sodium: 138 mmol/L (ref 135–145)

## 2021-04-27 LAB — GLUCOSE, CAPILLARY
Glucose-Capillary: 111 mg/dL — ABNORMAL HIGH (ref 70–99)
Glucose-Capillary: 113 mg/dL — ABNORMAL HIGH (ref 70–99)
Glucose-Capillary: 76 mg/dL (ref 70–99)
Glucose-Capillary: 163 mg/dL — ABNORMAL HIGH (ref 70–99)

## 2021-04-27 MED ORDER — POTASSIUM CHLORIDE CRYS ER 20 MEQ PO TBCR
40.0000 meq | EXTENDED_RELEASE_TABLET | Freq: Two times a day (BID) | ORAL | Status: DC
  Administered 2021-04-27 – 2021-04-28 (×3): 40 meq via ORAL
  Filled 2021-04-27 (×3): qty 2

## 2021-04-27 MED ORDER — FUROSEMIDE 10 MG/ML IJ SOLN
80.0000 mg | Freq: Two times a day (BID) | INTRAMUSCULAR | Status: DC
  Administered 2021-04-27 – 2021-04-28 (×3): 80 mg via INTRAVENOUS
  Filled 2021-04-27 (×3): qty 8

## 2021-04-27 MED ORDER — MAGNESIUM SULFATE 2 GM/50ML IV SOLN
2.0000 g | Freq: Once | INTRAVENOUS | Status: AC
  Administered 2021-04-27: 2 g via INTRAVENOUS
  Filled 2021-04-27: qty 50

## 2021-04-27 NOTE — Progress Notes (Signed)
Pt has PRN Bipap order, no distress noted at this time.

## 2021-04-27 NOTE — Progress Notes (Signed)
Progress Note  Patient Name: Todd [name] Date of Encounter: 04/27/2021  [facility] Cardiologist: Minus Breeding, MD   Subjective   Robust diuresis with large bolus lasix.   Inpatient Medications    Scheduled Meds:  apixaban  5 mg Oral BID   atorvastatin  40 mg Oral Daily   dextromethorphan-guaiFENesin  1 tablet Oral BID   folic acid  1 mg Oral Daily   insulin aspart  0-5 Units Subcutaneous QHS   insulin aspart  0-9 Units Subcutaneous TID WC   insulin aspart  3 Units Subcutaneous TID WC   ipratropium-albuterol  3 mL Nebulization BID   isosorbide-hydrALAZINE  1 tablet Oral TID   levothyroxine  100 mcg Oral QAC breakfast   metoprolol succinate  100 mg Oral Daily   multivitamin with minerals  1 tablet Oral Daily   pantoprazole  40 mg Oral BID   sodium bicarbonate  1,300 mg Oral TID   thiamine  100 mg Oral Daily   Continuous Infusions:  sodium chloride Stopped (04/20/21 0916)   piperacillin-tazobactam (ZOSYN)  IV 3.375 g (04/27/21 0822)   PRN Meds: sodium chloride, acetaminophen, ipratropium-albuterol, nicotine   Vital Signs    Vitals:   04/26/21 2114 04/27/21 0518 04/27/21 0755 04/27/21 0914  BP: 117/64 126/87    Pulse: 100 100 85   Resp: 20 16 16    Temp: 98.5 F (36.9 C) 98.4 F (36.9 C)    TempSrc: Axillary Oral    SpO2: 99% 100% 100% 96%  Weight:  67.3 kg    Height:        Intake/Output Summary (Last 24 hours) at 04/27/2021 0943 Last data filed at 04/27/2021 0715 Gross per 24 hour  Intake --  Output 2150 ml  Net -2150 ml   Last 3 Weights 04/27/2021 04/26/2021 04/24/2021  Weight (lbs) 148 lb 5.9 oz 152 lb 1.9 oz 153 lb  Weight (kg) 67.3 kg 69 kg 69.4 kg      Telemetry    Normal sinus rhythm with rates in the 80s to 90s. Frequent ectopy noted including PAC, PVC, short runs of NSVT , and short of what looks like PAT. - Personally Reviewed  ECG    No new ECG tracing today. - Personally Reviewed  Physical Exam   GEN: No acute distress.    Neck: No JVD Cardiac: RRR, no murmurs, rubs, or gallops.  Respiratory: Clear to auscultation bilaterally. GI: Soft, nontender, non-distended  MS: BL AKA Neuro:  Nonfocal  Psych: Normal affect    Labs    High Sensitivity Troponin:   Recent Labs  Lab 04/19/21 0019 04/20/21 0254 04/20/21 0502 04/20/21 0639  TROPONINIHS 52* 55* 55* 56*     Chemistry Recent Labs  Lab 04/20/21 1507 04/21/21 0327 04/22/21 0207 04/23/21 0339 04/25/21 0200 04/26/21 0100 04/26/21 0920 04/27/21 0232  NA 140  --  136   < > 139 140  --  138  K 3.9  --  4.6   < > 3.5 3.4*  --  3.4*  CL 116*  --  111   < > 114* 112*  --  109  CO2 16*  --  14*   < > 15* 16*  --  16*  GLUCOSE 153*  --  384*   < > 62* 104*  --  143*  BUN 26*  --  43*   < > 33* 31*  --  35*  CREATININE 1.69*  --  2.25*   < > 2.12* 2.19*  --  2.59*  CALCIUM 7.9*  --  7.9*   < > 6.6* 6.9*  --  6.7*  MG  --  2.5*  --   --   --   --  1.8  --   PROT 6.5  --  6.7  --   --   --   --   --   ALBUMIN 2.4*  --  2.4*   < > 1.8* 1.8*  --  1.8*  AST 17  --  16  --   --   --   --   --   ALT 18  --  17  --   --   --   --   --   ALKPHOS 100  --  124  --   --   --   --   --   BILITOT 0.7  --  0.5  --   --   --   --   --   GFRNONAA 44*  --  32*   < > 34* 33*  --  26*  ANIONGAP 8  --  11   < > 10 12  --  13   < > = values in this interval not displayed.    Lipids No results for input(s): CHOL, TRIG, HDL, LABVLDL, LDLCALC, CHOLHDL in the last 168 hours.  Hematology Recent Labs  Lab 04/22/21 0207 04/23/21 0339 04/24/21 0701 04/27/21 0232  WBC 22.1* 15.8*  --  15.8*  RBC 3.66* 3.13*  --  3.23*  HGB 10.1* 8.8* 9.9* 9.0*  HCT 32.1* 27.8* 30.3* 28.1*  MCV 87.7 88.8  --  87.0  MCH 27.6 28.1  --  27.9  MCHC 31.5 31.7  --  32.0  RDW 16.3* 16.3*  --  15.7*  PLT 677* 581*  --  571*   Thyroid  Recent Labs  Lab 04/22/21 0207 04/22/21 1612  TSH 0.315*  --   FREET4  --  1.12    BNPNo results for input(s): BNP, PROBNP in the last 168 hours.   DDimer No results for input(s): DDIMER in the last 168 hours.   Radiology    DG CHEST PORT 1 VIEW  Result Date: 04/26/2021 CLINICAL DATA:  Acute respiratory failure with hypoxia. EXAM: PORTABLE CHEST 1 VIEW COMPARISON:  04/25/2021 FINDINGS: Heart size is accentuated by technique and stable. Patchy infiltrates are again identified throughout the lungs, some interval improvement in aeration. RIGHT pleural effusion is small. IMPRESSION: Slightly improved aeration. Electronically Signed   By: Nolon Nations M.D.   On: 04/26/2021 10:42    Cardiac Studies   Limited Echocardiogram 24-Apr-2021: Impressions:  1. Left ventricular ejection fraction, by estimation, is 45 to 50%. The  left ventricle has mildly decreased function. There is moderate left  ventricular hypertrophy.   2. Right ventricular systolic function is normal. The right ventricular  size is normal.   3. Left atrial size was severely dilated.   4. Right atrial size was moderately dilated.   5. The inferior vena cava is normal in size with greater than 50%  respiratory variability, suggesting right atrial pressure of 3 mmHg.   6. Limited echo evaluate LV function.   Patient Profile     65 y.o. male with a history of presumed CAD, mild cardiomyopathy with normalization of EF on Echo in 12/2020, paroxysmal atrial fibrillation on Eliquis, PAD s/p bilateral AKA in 2018, hypertension, hyperlipidemia, type 2 diabetes mellitus, hypothyroidism, CKD stage III, polysubstance abuse (cocaine, alcohol, tobacco), and poor  compliance who was admitted on 04/19/2021 with acute hypoxic respiratory failure secondary to community acquired pneumonia and CHF. Cardiology consulted for further evaluation of CHF.  Assessment & Plan    Acute on Chronic CHF with Mildly Reduced EF - BNP elevated at 859. - Continue IV Lasix for now, 80 mg IV BID today and reassess with renal fxn tomorrow. - Continue Toprol-XL 100mg  daily. - Continue BiDil 20-37.5mg  three  times daily. Of note, insurance will not cover BiDil at discharge. Can continue during admission but will need to prescribe isosorbide dinitrate and hydralazine separately at discharge. - No ACEi/ARB given renal function. - Continue daily weights, strict I/Os, and renal function. - Patient felt to likely have ischemic cardiomyopathy but felt to be high risk for cardiac catheterization due to renal function so medical management has been recommended.  Elevated Troponin Presumed CAD - High-sensitivity troponin minimally elevated and flat at 52 >> 55 >> 55 >> 56. Not consistent with ACS. - Previous Echo in 2019 showed LVEF of 45-50% with hypokinesis of the basal-mid inferior and basal-mid anteroseptal myocardium. EF normalized on repeat Echo but is now back down to 45-50%. He has presumed CAD but has never undergone ischemic evaluation. Considered hgh risk for cardiac catheterization due to renal function so medical therapy has been recommended. - No chest pain. - No Aspirin given need for DOAC. - Continue beta-blocker and high-intensity statin.  Paroxysmal Atrial Fibrillation - Maintaining sinus rhythm but having frequent ectopy on telemetry. - Potassium 3.4 today. Kcl 40 meQ po Bid for 4 doses.  - Will replete mg 2 g IV today. Ectopy noted on tele and mg 1.8 yesterday. - Contineu Toprol-XL 100mg  daily. - Continue Eliquis 5mg  twice daily.  PAD  - S/p bilateral above knee amputations in 2018. - No Aspiring given need for DOAC. - Continue statin.  Hypertension - BP well controlled.  - Continue medications for CHF.  Hyperlipidemia - Continue Lipitor 40mg  daily.  Type 2 Diabetes Mellitus - Hemoglobin A1c 7.9. - Management per primary team.  Acute on CKD Stage III - Creatinine 1.89 on admission, now 2.59 with diuresis however benefits outweigh risks. - Continue to monitor closely.  Otherwise, per primary team: - Community acquired pneumonia: complete antibiotic therapy - COPD  exacerbation - Polysubstance abuse - Hypothyroidism - Anemia of chronic disease - GERD - Malnutrition   For questions or updates, please contact [facility] Please consult [email or web address] for contact info under        Signed, Elouise Munroe, MD  04/27/2021, 9:43 AM

## 2021-04-27 NOTE — Progress Notes (Signed)
PROGRESS NOTE  Todd [name]  WSF:[id number] DOB: 02-10-1956 DOA: 04/19/2021 PCP: [name], FNP  Brief Narrative: Todd [name] is a 65 y.o. male recently admitted from 01/10/2021 to 01/15/2021 due to chronic A. fib with RVR, with medical history significant for essential hypertension, hypothyroidism, type 2 diabetes mellitus, chronic atrial fibrillation, status post bilateral AKA, history of polysubstance abuse (alcohol, tobacco and cocaine) who presents to [facility] ED from [facility] via EMS due to shortness of breath.  He was noted to be hypoxic, in the 80% on room air at the SNF, neb treatment was provided and patient was placed on a supplemental oxygen via [place] at 2 LPM with minimal improvement.  EMS was activated and on arrival, O2 supplementation was increased to 4 LPM en route to the hospital.   Work-up revealed acute hypoxic respiratory failure, right-sided community-acquired pneumonia, mild pulmonary edema, elevated troponin, elevated BNP greater than 800, acute on chronic diastolic CHF, AKI, non-anion gap metabolic acidosis, and electrolyte abnormalities.  Echocardiogram revealed LV systolic dysfunction and monitoring detected RVR. Lasix was given with subsequently increasing creatinine. Diuresis pushed despite renal impairment due to progressive pulmonary edema on CXR with hypoxia.   Assessment & Plan: Principal Problem:   CAP (community acquired pneumonia) Active Problems:   Essential hypertension   Acquired hypothyroidism   Elevated troponin   Acute respiratory failure with hypoxia (HCC)   Hyperglycemia due to diabetes mellitus (HCC)   Leukocytosis   Reactive thrombocytosis   S/P AKA (above knee amputation) bilateral (HCC)   AKI (acute kidney injury) ([place])   Atrial fibrillation, chronic (HCC)   Hypomagnesemia   Elevated brain natriuretic peptide (BNP) level   Polysubstance abuse (HCC)   Noncompliance with medication regimen   Chronic diastolic CHF (congestive  heart failure) (HCC)   Hypoalbuminemia due to protein-calorie malnutrition (HCC)   GERD (gastroesophageal reflux disease)  Acute hypoxic respiratory failure: Presumed to be due to CHF and right-sided pneumonia, COPD exacerbation also possible given previous reports of wheezing. Bronchitic changes on CXR with hazy ground glass appearance of right lung. Opacity on right advanced considerably on repeat 11/10 and patchy bilateral opacities (based on personal review and confirmed on radiologist interpretation) worsening on 11/13 with return of hypoxemia, crackles.  - Continue IV diuresis today with persistent respiratory symptoms and hypoxia.  - Recheck CXR in AM.  - Continue supplemental oxygen and prn BiPAP for now.   PAF, multifocal atrial tachycardia: Recent admit for RVR.  - Continue metoprolol succinate, increased to 100mg  dose due to persistent MAT per cardiology. - Maintain K >4, Mg >2. Aggressive supplementation ordered, especially with NSVT on tele..  Acute on chronic HFrEF, HTN: LVEF 45-50% without RWMA, +LVH, normal IVC. Severe LAE, moderate RAE. BNP elevated.  - Cardiology consulted. D/w Dr. Margaretann Loveless. Will need to push diuresis today and monitor renal function in AM.    - Converted norvasc to bidil. Continue metoprolol succinate.   AKI with NAGMA: No abnormality on U/S, though did have trace perinephric fluid on right. Bland sediment with definite proteinuria 1.03. FENa not consistent with prerenal azotemia at 1.78%. Note history of worse AKIs that were associated with worse acidosis in the past.  - Suspect insult was primarily related to vancomycin, now will need continued monitoring with diuresis.   - Continue bicarbonate tabs  Demand myocardial ischemia: Mild troponin elevation with reassuring delta no anginal complaints.  - Defer w/u to cardiology  Right-sided CAP: Progressive infiltrates with hypoxia overnight into 11/10. Abx coverage expanded, though  infiltrates continue to  increase despite improving WBC, reassuring PCT and adequate abx coverage. - Completed azithromycin x5 days. Stopped vanc w/nephrotoxicity and negative MRSA PCR. Complete 7 days of zosyn.   - Monitor culture data. Blood Cx's NGTD x5 days, no sputum culture obtained.  COPD with exacerbation:  - Given resolution of wheezing and severe steroid-induced hyperglycemia, steroids were stopped. We will continue nebs. Worsening respiratory status as discussed above is concomitant with CXR infiltrates and not wheezing.   Uncontrolled T2DM with steroid-induced hyperglycemia: HbA1c 7.9%.  - Now that steroid effect waning, hypoglycemic so stopped basal insulin. Continue TIDWC + SSI novolog.  Polysubstance abuse: Previously +alcohol, tobacco, cocaine.  - Cessation counseling. Monitor for withdrawal, continue nicotine patch.   Hypothyroidism: Repeat TSH is 0.315, last was 20.52 in September 2022 which was improved from 189 in July in setting of medication nonadherence. Free T4 is 1.12 which is the ULN.  - In setting of AFib/MAT, we did decrease synthroid dose 122mcg > 111mcg.   Anemia of chronic disease: Having a decline in hgb from admission, though all cell lines are down and hgb was above baseline on admission.  - Hemoglobin has stabilized somewhat near baseline.   HTN: Tx as above  PVD s/p bilateral AKA (2018-2019):  - Continue statin, anticoagulation  GERD:  - Continue PPI  Suspected malnutrition: Albumin is 1.8. - Dietitian consulted.   DVT prophylaxis: Eliquis Code Status: Full Family Communication: None at bedside Disposition Plan:  Status is: Inpatient, transfer to higher level of care - PCU  Remains inpatient appropriate because: requires treatment for acute CHF and pneumonia.   Consultants:  Cardiology  Procedures:  Echocardiogram  Antimicrobials: Ceftriaxone, azithromycin > vancomycin, zosyn.  Subjective: Reports breathing has been better overall the past 12-24 hours.  Urinating significantly. He denies chest pain. Eating normally.   Objective: Vitals:   04/27/21 0518 04/27/21 0755 04/27/21 0914 04/27/21 1017  BP: 126/87     Pulse: 100 85    Resp: 16 16    Temp: 98.4 F (36.9 C)     TempSrc: Oral     SpO2: 100% 100% 96% 98%  Weight: 67.3 kg     Height:        Intake/Output Summary (Last 24 hours) at 04/27/2021 1419 Last data filed at 04/27/2021 0715 Gross per 24 hour  Intake --  Output 1350 ml  Net -1350 ml   Filed Weights   04/24/21 0406 04/26/21 0650 04/27/21 0518  Weight: 69.4 kg 69 kg 67.3 kg   Gen: Chronically ill-appearing male in no distress Pulm: Nonlabored tachypnea on supplemental oxygen. Crackles remain bilaterally. CV: Regular tachycardia with occasional premature beat.. No murmur, rub, or gallop. No JVD, no dependent edema. GI: Abdomen soft, non-tender, non-distended, with normoactive bowel sounds.  Ext: Warm, dry BL AKA's Skin: No new rashes, lesions or ulcers on visualized skin. Neuro: Alert and oriented. No focal neurological deficits. Psych: Judgement and insight appear fair. Mood euthymic & affect congruent. Behavior is appropriate.    Data Reviewed: I have personally reviewed following labs and imaging studies  CBC: Recent Labs  Lab 04/20/21 1507 04/22/21 0207 04/23/21 0339 04/24/21 0701 04/27/21 0232  WBC 16.3* 22.1* 15.8*  --  15.8*  NEUTROABS  --   --  12.3*  --  12.8*  HGB 10.7* 10.1* 8.8* 9.9* 9.0*  HCT 32.4* 32.1* 27.8* 30.3* 28.1*  MCV 86.9 87.7 88.8  --  87.0  PLT 657* 677* 581*  --  571*   Basic  Metabolic Panel: Recent Labs  Lab 04/21/21 0327 04/22/21 0207 04/23/21 0339 04/24/21 0326 04/25/21 0200 04/26/21 0100 04/26/21 0920 04/27/21 0232  NA  --    < > 140 140 139 140  --  138  K  --    < > 3.7 3.5 3.5 3.4*  --  3.4*  CL  --    < > 114* 115* 114* 112*  --  109  CO2  --    < > 16* 15* 15* 16*  --  16*  GLUCOSE  --    < > 226* 189* 62* 104*  --  143*  BUN  --    < > 40* 42* 33* 31*  --   35*  CREATININE  --    < > 2.42* 2.34* 2.12* 2.19*  --  2.59*  CALCIUM  --    < > 7.2* 6.9* 6.6* 6.9*  --  6.7*  MG 2.5*  --   --   --   --   --  1.8  --   PHOS 5.1*  --  4.6 4.3 4.8* 6.3*  --  6.2*   < > = values in this interval not displayed.   GFR: Estimated Creatinine Clearance: 26.7 mL/min (A) (by C-G formula based on SCr of 2.59 mg/dL (H)). Liver Function Tests: Recent Labs  Lab 04/20/21 1507 04/22/21 0207 04/23/21 0339 04/24/21 0326 04/25/21 0200 04/26/21 0100 04/27/21 0232  AST 17 16  --   --   --   --   --   ALT 18 17  --   --   --   --   --   ALKPHOS 100 124  --   --   --   --   --   BILITOT 0.7 0.5  --   --   --   --   --   PROT 6.5 6.7  --   --   --   --   --   ALBUMIN 2.4* 2.4* 1.8* 1.9* 1.8* 1.8* 1.8*   No results for input(s): LIPASE, AMYLASE in the last 168 hours. No results for input(s): AMMONIA in the last 168 hours. Coagulation Profile: No results for input(s): INR, PROTIME in the last 168 hours.  Cardiac Enzymes: No results for input(s): CKTOTAL, CKMB, CKMBINDEX, TROPONINI in the last 168 hours. BNP (last 3 results) No results for input(s): PROBNP in the last 8760 hours. HbA1C: No results for input(s): HGBA1C in the last 72 hours.  CBG: Recent Labs  Lab 04/26/21 1140 04/26/21 1554 04/26/21 2113 04/27/21 0743 04/27/21 1119  GLUCAP 116* 123* 152* 111* 113*   Lipid Profile: No results for input(s): CHOL, HDL, LDLCALC, TRIG, CHOLHDL, LDLDIRECT in the last 72 hours. Thyroid Function Tests: No results for input(s): TSH, T4TOTAL, FREET4, T3FREE, THYROIDAB in the last 72 hours.  Anemia Panel: No results for input(s): VITAMINB12, FOLATE, FERRITIN, TIBC, IRON, RETICCTPCT in the last 72 hours. Urine analysis:    Component Value Date/Time   COLORURINE YELLOW 04/24/2021 [place] 04/24/2021 0446   APPEARANCEUR Clear 02/09/2018 1352   LABSPEC 1.018 04/24/2021 0446   PHURINE 5.0 04/24/2021 0446   GLUCOSEU NEGATIVE 04/24/2021 0446    HGBUR NEGATIVE 04/24/2021 0446   BILIRUBINUR NEGATIVE 04/24/2021 0446   BILIRUBINUR Negative 02/09/2018 1352   KETONESUR NEGATIVE 04/24/2021 0446   PROTEINUR 100 (A) 04/24/2021 0446   NITRITE NEGATIVE 04/24/2021 0446   LEUKOCYTESUR NEGATIVE 04/24/2021 0446   Recent Results (from the past 240  hour(s))  Resp Panel by RT-PCR (Flu A&B, Covid) Nasopharyngeal Swab     Status: None   Collection Time: 04/19/21 12:12 AM   Specimen: Nasopharyngeal Swab; Nasopharyngeal(NP) swabs in vial transport medium  Result Value Ref Range Status   SARS Coronavirus 2 by RT PCR NEGATIVE NEGATIVE Final    Comment: (NOTE) SARS-CoV-2 target nucleic acids are NOT DETECTED.  The SARS-CoV-2 RNA is generally detectable in upper respiratory specimens during the acute phase of infection. The lowest concentration of SARS-CoV-2 viral copies this assay can detect is 138 copies/mL. A negative result does not preclude SARS-Cov-2 infection and should not be used as the sole basis for treatment or other patient management decisions. A negative result may occur with  improper specimen collection/handling, submission of specimen other than nasopharyngeal swab, presence of viral mutation(s) within the areas targeted by this assay, and inadequate number of viral copies(<138 copies/mL). A negative result must be combined with clinical observations, patient history, and epidemiological information. The expected result is Negative.  Fact Sheet for Patients:  EntrepreneurPulse.com.au  Fact Sheet for Healthcare Providers:  IncredibleEmployment.be  This test is no t yet approved or cleared by the [place] FDA and  has been authorized for detection and/or diagnosis of SARS-CoV-2 by FDA under an Emergency Use Authorization (EUA). This EUA will remain  in effect (meaning this test can be used) for the duration of the COVID-19 declaration under Section 564(b)(1) of the Act, 21 U.S.C.section  360bbb-3(b)(1), unless the authorization is terminated  or revoked sooner.       Influenza A by PCR NEGATIVE NEGATIVE Final   Influenza B by PCR NEGATIVE NEGATIVE Final    Comment: (NOTE) The Xpert Xpress SARS-CoV-2/FLU/RSV plus assay is intended as an aid in the diagnosis of influenza from Nasopharyngeal swab specimens and should not be used as a sole basis for treatment. Nasal washings and aspirates are unacceptable for Xpert Xpress SARS-CoV-2/FLU/RSV testing.  Fact Sheet for Patients: EntrepreneurPulse.com.au  Fact Sheet for Healthcare Providers: IncredibleEmployment.be  This test is not yet approved or cleared by the [place] FDA and has been authorized for detection and/or diagnosis of SARS-CoV-2 by FDA under an Emergency Use Authorization (EUA). This EUA will remain in effect (meaning this test can be used) for the duration of the COVID-19 declaration under Section 564(b)(1) of the Act, 21 U.S.C. section 360bbb-3(b)(1), unless the authorization is terminated or revoked.  Performed at [facility], [address] [place], [place] [address]   Culture, blood (Routine X 2) w Reflex to ID Panel     Status: None   Collection Time: 04/20/21 12:52 AM   Specimen: BLOOD  Result Value Ref Range Status   Specimen Description BLOOD LEFT ANTECUBITAL  Final   Special Requests   Final    BOTTLES DRAWN AEROBIC AND ANAEROBIC Blood Culture adequate volume   Culture   Final    NO GROWTH 5 DAYS Performed at [facility] [address] [place], [place] [address]    Report Status 04/25/2021 FINAL  Final  Culture, blood (Routine X 2) w Reflex to ID Panel     Status: None   Collection Time: 04/20/21 12:52 AM   Specimen: BLOOD  Result Value Ref Range Status   Specimen Description BLOOD BLOOD LEFT HAND  Final   Special Requests   Final    BOTTLES DRAWN AEROBIC AND ANAEROBIC Blood Culture adequate volume   Culture   Final    NO GROWTH 5  DAYS Performed at [facility], [address], [place], [place] [address]    Report Status 04/25/2021 FINAL  Final  MRSA Next Gen by PCR, Nasal     Status: None   Collection Time: 04/22/21  4:40 PM   Specimen: Nasal Mucosa; Nasal Swab  Result Value Ref Range Status   MRSA by PCR Next Gen NOT DETECTED NOT DETECTED Final    Comment: (NOTE) The GeneXpert MRSA Assay (FDA approved for NASAL specimens only), is one component of a comprehensive MRSA colonization surveillance program. It is not intended to diagnose MRSA infection nor to guide or monitor treatment for MRSA infections. Test performance is not FDA approved in patients less than 88 years old. Performed at [facility], [place] [address], [place], [place] [address]       Radiology Studies: DG CHEST PORT 1 VIEW  Result Date: 04/26/2021 CLINICAL DATA:  Acute respiratory failure with hypoxia. EXAM: PORTABLE CHEST 1 VIEW COMPARISON:  04/25/2021 FINDINGS: Heart size is accentuated by technique and stable. Patchy infiltrates are again identified throughout the lungs, some interval improvement in aeration. RIGHT pleural effusion is small. IMPRESSION: Slightly improved aeration. Electronically Signed   By: Nolon Nations M.D.   On: 04/26/2021 10:42    Scheduled Meds:  apixaban  5 mg Oral BID   atorvastatin  40 mg Oral Daily   dextromethorphan-guaiFENesin  1 tablet Oral BID   folic acid  1 mg Oral Daily   furosemide  80 mg Intravenous BID   insulin aspart  0-5 Units Subcutaneous QHS   insulin aspart  0-9 Units Subcutaneous TID WC   insulin aspart  3 Units Subcutaneous TID WC   ipratropium-albuterol  3 mL Nebulization BID   isosorbide-hydrALAZINE  1 tablet Oral TID   levothyroxine  100 mcg Oral QAC breakfast   metoprolol succinate  100 mg Oral Daily   multivitamin with minerals  1 tablet Oral Daily   pantoprazole  40 mg Oral BID   potassium chloride  40 mEq Oral BID   sodium bicarbonate  1,300 mg Oral TID   thiamine  100 mg  Oral Daily   Continuous Infusions:  sodium chloride Stopped (04/20/21 0916)   piperacillin-tazobactam (ZOSYN)  IV 3.375 g (04/27/21 0822)     LOS: 7 days   Time spent: 35 minutes.  Patrecia Pour, MD [facility] [email or web address] 04/27/2021, 2:19 PM

## 2021-04-27 NOTE — NC FL2 (Signed)
[place] LEVEL OF CARE SCREENING TOOL     IDENTIFICATION  Patient Name: Todd [name] Birthdate: 10/03/1955 Sex: male Admission Date (Current Location): 04/19/2021  [facility] and [place] Number:  Herbalist and Address:  [facility], [place] [address], [place], [place] [address]      Provider Number: [id number]  Attending Physician Name and Address:  Patrecia Pour, MD  Relative Name and Phone Number:  [name] [phone]    Current Level of Care: Hospital Recommended Level of Care: [place] Prior Approval Number:    Date Approved/Denied:   PASRR Number:    Discharge Plan: SNF    Current Diagnoses: Patient Active Problem List   Diagnosis Date Noted   CAP (community acquired pneumonia) 04/20/2021   Elevated brain natriuretic peptide (BNP) level 04/20/2021   Polysubstance abuse ([place]) 04/20/2021   Noncompliance with medication regimen 04/20/2021   Chronic diastolic CHF (congestive heart failure) ([place]) 04/20/2021   Hypoalbuminemia due to protein-calorie malnutrition ([place]) 04/20/2021   GERD (gastroesophageal reflux disease) 04/20/2021   Candida esophagitis ([place])    Iron deficiency anemia due to chronic blood loss 01/11/2021   Blood in stool    Atrial fibrillation with RVR ([place]) 01/10/2021   Hypomagnesemia 01/10/2021   Severe Hypophosphatemia 12/28/2020   Non-Sustained V-tach in the setting of Severe Electrolyte Derangement 12/28/2020   Cocaine abuse -on-going 12/28/2020   Alcohol abuse, continuous 12/28/2020   Syncope and collapse-suspect arrhythmia related in the setting of severe electrolyte abnormalities 12/28/2020   Diabetes mellitus type 2 with complications ([place]) 55/73/2202   Tobacco abuse 12/28/2020   Atrial fibrillation, chronic (HCC) 12/26/2020   Hypocalcemia 54/27/0623   Acute metabolic encephalopathy 76/28/3151   Hypoglycemia 12/05/2020   AKI (acute kidney injury) ([place]) 12/04/2020   Vitamin D  deficiency 06/17/2019   Essential hypertension, benign 08/30/2018   Mixed hyperlipidemia 07/17/2018   Hyponatremia 02/14/2018   Hypokalemia 02/14/2018   Medically noncompliant 02/14/2018   S/P AKA (above knee amputation) bilateral ([place]) 02/12/2018   Current smoker 02/03/2018   Hyperglycemia due to diabetes mellitus ([place]) 02/03/2018   Leukocytosis 02/03/2018   Reactive thrombocytosis 02/03/2018   Urinary incontinence 08/14/2017   Transaminitis    Acute respiratory failure with hypoxia (HCC)    Elevated troponin 07/04/2016   Hyperuricemia 06/21/2016   Essential hypertension 01/05/2016   Diabetes type 2, uncontrolled 01/05/2016   Acquired hypothyroidism 05/10/2011    Orientation RESPIRATION BLADDER Height & Weight     Self, Time, Situation, Place (WDL)  Normal Continent, External catheter (External Urinary Catheter) Weight: 148 lb 5.9 oz (67.3 kg) Height:  5\' 8"  (172.7 cm)  BEHAVIORAL SYMPTOMS/MOOD NEUROLOGICAL BOWEL NUTRITION STATUS      Continent (WDL) Diet (Please see discharge summary)  AMBULATORY STATUS COMMUNICATION OF NEEDS Skin     Verbally Normal                       Personal Care Assistance Level of Assistance  Bathing, Feeding, Dressing Bathing Assistance: Maximum assistance Feeding assistance: Independent Dressing Assistance: Maximum assistance     Functional Limitations Info  Sight, Hearing, Speech Sight Info: Adequate Hearing Info: Adequate Speech Info: Adequate    SPECIAL CARE FACTORS FREQUENCY  PT (By licensed PT), OT (By licensed OT)     PT Frequency: 5x min weekly OT Frequency: 5x min weekly            Contractures Contractures Info: Not present    Additional Factors  Info  Code Status, Allergies, Insulin Sliding Scale, Isolation Precautions Code Status Info: FULL Allergies Info: Lisinopril   Insulin Sliding Scale Info: insulin aspart (novoLOG) injection 0-5 Units daily at bedtime,insulin aspart (novoLOG) injection 0-9 Units 3 times  daily with meals Isolation Precautions Info: ESBL onset date 01/11/21     Current Medications (04/27/2021):  This is the current hospital active medication list Current Facility-Administered Medications  Medication Dose Route Frequency Provider Last Rate Last Admin   0.9 %  sodium chloride infusion   Intravenous PRN Adefeso, Oladapo, DO   Stopped at 04/20/21 0916   acetaminophen (TYLENOL) tablet 650 mg  650 mg Oral Q6H PRN Adefeso, Oladapo, DO       apixaban (ELIQUIS) tablet 5 mg  5 mg Oral BID Adefeso, Oladapo, DO   5 mg at 04/27/21 1014   atorvastatin (LIPITOR) tablet 40 mg  40 mg Oral Daily Adefeso, Oladapo, DO   40 mg at 04/27/21 1014   dextromethorphan-guaiFENesin (MUCINEX DM) 30-600 MG per 12 hr tablet 1 tablet  1 tablet Oral BID Adefeso, Oladapo, DO   1 tablet at 50/53/97 6734   folic acid (FOLVITE) tablet 1 mg  1 mg Oral Daily Adefeso, Oladapo, DO   1 mg at 04/27/21 1014   furosemide (LASIX) injection 80 mg  80 mg Intravenous BID Cherlynn Kaiser A, MD   80 mg at 04/27/21 1041   insulin aspart (novoLOG) injection 0-5 Units  0-5 Units Subcutaneous QHS Vance Gather B, MD       insulin aspart (novoLOG) injection 0-9 Units  0-9 Units Subcutaneous TID WC Patrecia Pour, MD   1 Units at 04/26/21 1645   insulin aspart (novoLOG) injection 3 Units  3 Units Subcutaneous TID WC Vance Gather B, MD   3 Units at 04/27/21 1400   ipratropium-albuterol (DUONEB) 0.5-2.5 (3) MG/3ML nebulizer solution 3 mL  3 mL Nebulization BID Irene Pap N, DO   3 mL at 04/27/21 0915   ipratropium-albuterol (DUONEB) 0.5-2.5 (3) MG/3ML nebulizer solution 3 mL  3 mL Nebulization Q4H PRN Kristopher Oppenheim, DO   3 mL at 04/25/21 1248   isosorbide-hydrALAZINE (BIDIL) 20-37.5 MG per tablet 1 tablet  1 tablet Oral TID Minus Breeding, MD   1 tablet at 04/27/21 1014   levothyroxine (SYNTHROID) tablet 100 mcg  100 mcg Oral QAC breakfast Patrecia Pour, MD   100 mcg at 04/27/21 0754   metoprolol succinate (TOPROL-XL) 24 hr tablet 100 mg  100  mg Oral Daily Minus Breeding, MD   100 mg at 04/27/21 1014   multivitamin with minerals tablet 1 tablet  1 tablet Oral Daily Adefeso, Oladapo, DO   1 tablet at 04/27/21 1014   nicotine (NICODERM CQ - dosed in mg/24 hours) patch 14 mg  14 mg Transdermal Daily PRN [place] Crane, Carole N, DO       pantoprazole (PROTONIX) EC tablet 40 mg  40 mg Oral BID Adefeso, Oladapo, DO   40 mg at 04/27/21 1014   piperacillin-tazobactam (ZOSYN) IVPB 3.375 g  3.375 g Intravenous Q8H Vance Gather B, MD 12.5 mL/hr at 04/27/21 0822 3.375 g at 04/27/21 1937   potassium chloride SA (KLOR-CON) CR tablet 40 mEq  40 mEq Oral BID Cherlynn Kaiser A, MD   40 mEq at 04/27/21 1134   sodium bicarbonate tablet 1,300 mg  1,300 mg Oral TID Patrecia Pour, MD   1,300 mg at 04/27/21 1013   thiamine tablet 100 mg  100 mg Oral Daily Adefeso, Oladapo, DO  100 mg at 04/27/21 1014     Discharge Medications: Please see discharge summary for a list of discharge medications.  Relevant Imaging Results:  Relevant Lab Results:   Additional Information [phone]  Milas Gain, LCSWA

## 2021-04-27 NOTE — TOC Progression Note (Signed)
Transition of Care [facility]) - Progression Note    Patient Details  Name: Todd [name] MRN: [id number] Date of Birth: 1956/04/18  Transition of Care [facility]) CM/SW [place], [place] Phone Number: 04/27/2021, 2:08 PM  Clinical Narrative:     Patient is from [facility] long term. Plan is to return when medically ready for dc. CSW will continue to follow and assist with patients dc planning needs.    Barriers to Discharge: Continued Medical Work up  Expected Discharge Plan and Services           Expected Discharge Date: 04/25/21                                     Social Determinants of Health (SDOH) Interventions    Readmission Risk Interventions No flowsheet data found.

## 2021-04-28 ENCOUNTER — Inpatient Hospital Stay (HOSPITAL_COMMUNITY): Payer: Medicare HMO

## 2021-04-28 LAB — BASIC METABOLIC PANEL
Anion gap: 13 (ref 5–15)
BUN: 44 mg/dL — ABNORMAL HIGH (ref 8–23)
CO2: 19 mmol/L — ABNORMAL LOW (ref 22–32)
Calcium: 6.3 mg/dL — CL (ref 8.9–10.3)
Chloride: 105 mmol/L (ref 98–111)
Creatinine, Ser: 2.99 mg/dL — ABNORMAL HIGH (ref 0.61–1.24)
GFR, Estimated: 22 mL/min — ABNORMAL LOW (ref >60)
Glucose, Bld: 166 mg/dL — ABNORMAL HIGH (ref 70–99)
Potassium: 3.7 mmol/L (ref 3.5–5.1)
Sodium: 137 mmol/L (ref 135–145)

## 2021-04-28 LAB — RENAL FUNCTION PANEL
Albumin: 1.9 g/dL — ABNORMAL LOW (ref 3.5–5.0)
Anion gap: 13 (ref 5–15)
BUN: 42 mg/dL — ABNORMAL HIGH (ref 8–23)
CO2: 18 mmol/L — ABNORMAL LOW (ref 22–32)
Calcium: 6.6 mg/dL — ABNORMAL LOW (ref 8.9–10.3)
Chloride: 105 mmol/L (ref 98–111)
Creatinine, Ser: 2.89 mg/dL — ABNORMAL HIGH (ref 0.61–1.24)
GFR, Estimated: 23 mL/min — ABNORMAL LOW (ref >60)
Glucose, Bld: 149 mg/dL — ABNORMAL HIGH (ref 70–99)
Phosphorus: 5.2 mg/dL — ABNORMAL HIGH (ref 2.5–4.6)
Potassium: 3.5 mmol/L (ref 3.5–5.1)
Sodium: 136 mmol/L (ref 135–145)

## 2021-04-28 LAB — GLUCOSE, CAPILLARY
Glucose-Capillary: 231 mg/dL — ABNORMAL HIGH (ref 70–99)
Glucose-Capillary: 139 mg/dL — ABNORMAL HIGH (ref 70–99)
Glucose-Capillary: 127 mg/dL — ABNORMAL HIGH (ref 70–99)
Glucose-Capillary: 212 mg/dL — ABNORMAL HIGH (ref 70–99)

## 2021-04-28 LAB — CBC
HCT: 28.2 % — ABNORMAL LOW (ref 39.0–52.0)
Hemoglobin: 9.3 g/dL — ABNORMAL LOW (ref 13.0–17.0)
MCH: 28.3 pg (ref 26.0–34.0)
MCHC: 33 g/dL (ref 30.0–36.0)
MCV: 85.7 fL (ref 80.0–100.0)
Platelets: 603 10*3/uL — ABNORMAL HIGH (ref 150–400)
RBC: 3.29 MIL/uL — ABNORMAL LOW (ref 4.22–5.81)
RDW: 15.4 % (ref 11.5–15.5)
WBC: 16.4 10*3/uL — ABNORMAL HIGH (ref 4.0–10.5)
nRBC: 0 % (ref 0.0–0.2)

## 2021-04-28 LAB — MAGNESIUM
Magnesium: 2.1 mg/dL (ref 1.7–2.4)
Magnesium: 1.8 mg/dL (ref 1.7–2.4)

## 2021-04-28 MED ORDER — POTASSIUM CHLORIDE CRYS ER 20 MEQ PO TBCR
40.0000 meq | EXTENDED_RELEASE_TABLET | Freq: Two times a day (BID) | ORAL | Status: AC
  Administered 2021-04-28 – 2021-04-29 (×3): 40 meq via ORAL
  Filled 2021-04-28 (×4): qty 2

## 2021-04-28 MED ORDER — FUROSEMIDE 40 MG PO TABS
80.0000 mg | ORAL_TABLET | Freq: Two times a day (BID) | ORAL | Status: DC
  Administered 2021-04-29: 10:00:00 80 mg via ORAL
  Filled 2021-04-28 (×2): qty 2

## 2021-04-28 MED ORDER — MAGNESIUM SULFATE IN D5W 1-5 GM/100ML-% IV SOLN
1.0000 g | Freq: Once | INTRAVENOUS | Status: AC
  Administered 2021-04-28: 1 g via INTRAVENOUS
  Filled 2021-04-28: qty 100

## 2021-04-28 MED ORDER — METOPROLOL SUCCINATE ER 25 MG PO TB24
25.0000 mg | ORAL_TABLET | Freq: Once | ORAL | Status: AC
  Administered 2021-04-28: 25 mg via ORAL
  Filled 2021-04-28: qty 1

## 2021-04-28 MED ORDER — CALCIUM GLUCONATE-NACL 1-0.675 GM/50ML-% IV SOLN
1.0000 g | Freq: Once | INTRAVENOUS | Status: AC
  Administered 2021-04-28: 1000 mg via INTRAVENOUS
  Filled 2021-04-28: qty 50

## 2021-04-28 NOTE — Progress Notes (Signed)
  Paged by RN for 53 beats on VTach pt was asymptomatic.  BP stable at 102/63.  Earlier in day K+ 3.5 and he has rec'd 40 meq K+  Pt denies chest pain, no awareness of rapid HR.  He is sitting up in bed.   EF 45-50%. Has had PAF during hospitalization and admitted with CHF.  Have ordered stat labs and will give his 2200 dose of K+ now.  Dr. Zandra Abts aware.

## 2021-04-28 NOTE — Progress Notes (Signed)
Progress Note  Patient Name: Todd [name] Date of Encounter: 04/28/2021  [facility] Cardiologist: Minus Breeding, MD   Subjective   Respiratory status greatly improved.  Inpatient Medications    Scheduled Meds:  apixaban  5 mg Oral BID   atorvastatin  40 mg Oral Daily   dextromethorphan-guaiFENesin  1 tablet Oral BID   folic acid  1 mg Oral Daily   insulin aspart  0-5 Units Subcutaneous QHS   insulin aspart  0-9 Units Subcutaneous TID WC   insulin aspart  3 Units Subcutaneous TID WC   ipratropium-albuterol  3 mL Nebulization BID   isosorbide-hydrALAZINE  1 tablet Oral TID   levothyroxine  100 mcg Oral QAC breakfast   metoprolol succinate  100 mg Oral Daily   multivitamin with minerals  1 tablet Oral Daily   pantoprazole  40 mg Oral BID   potassium chloride  40 mEq Oral BID   sodium bicarbonate  1,300 mg Oral TID   thiamine  100 mg Oral Daily   Continuous Infusions:  sodium chloride Stopped (04/20/21 0916)   piperacillin-tazobactam (ZOSYN)  IV 3.375 g (04/28/21 0958)   PRN Meds: sodium chloride, acetaminophen, ipratropium-albuterol, nicotine   Vital Signs    Vitals:   04/27/21 2220 04/28/21 0514 04/28/21 0741 04/28/21 0944  BP: 109/79 108/68  109/75  Pulse: 63 87  (!) 103  Resp: 14 15    Temp:  98.5 F (36.9 C)    TempSrc:  Oral    SpO2: 99% 99% 97%   Weight:  66 kg    Height:        Intake/Output Summary (Last 24 hours) at 04/28/2021 1317 Last data filed at 04/28/2021 0547 Gross per 24 hour  Intake 161.38 ml  Output 2050 ml  Net -1888.62 ml   Last 3 Weights 04/28/2021 04/27/2021 04/26/2021  Weight (lbs) 145 lb 8.1 oz 148 lb 5.9 oz 152 lb 1.9 oz  Weight (kg) 66 kg 67.3 kg 69 kg      Telemetry    Normal sinus rhythm with rates in the 80s to 90s. Run of PAT vs SVT. - Personally Reviewed  ECG    No new ECG tracing today. - Personally Reviewed  Physical Exam   GEN: No acute distress.   Neck: No JVD Cardiac: RRR, no murmurs, rubs, or  gallops.  Respiratory: Clear to auscultation bilaterally. GI: Soft, nontender, non-distended  MS: BL AKA Neuro:  Nonfocal  Psych: Normal affect   Labs    High Sensitivity Troponin:   Recent Labs  Lab 04/19/21 0019 04/20/21 0254 04/20/21 0502 04/20/21 0639  TROPONINIHS 52* 55* 55* 56*     Chemistry Recent Labs  Lab 04/22/21 0207 04/23/21 0339 04/26/21 0100 04/26/21 0920 04/27/21 0232 04/28/21 0134  NA 136   < > 140  --  138 136  K 4.6   < > 3.4*  --  3.4* 3.5  CL 111   < > 112*  --  109 105  CO2 14*   < > 16*  --  16* 18*  GLUCOSE 384*   < > 104*  --  143* 149*  BUN 43*   < > 31*  --  35* 42*  CREATININE 2.25*   < > 2.19*  --  2.59* 2.89*  CALCIUM 7.9*   < > 6.9*  --  6.7* 6.6*  MG  --   --   --  1.8  --  2.1  PROT 6.7  --   --   --   --   --  ALBUMIN 2.4*   < > 1.8*  --  1.8* 1.9*  AST 16  --   --   --   --   --   ALT 17  --   --   --   --   --   ALKPHOS 124  --   --   --   --   --   BILITOT 0.5  --   --   --   --   --   GFRNONAA 32*   < > 33*  --  26* 23*  ANIONGAP 11   < > 12  --  13 13   < > = values in this interval not displayed.    Lipids No results for input(s): CHOL, TRIG, HDL, LABVLDL, LDLCALC, CHOLHDL in the last 168 hours.  Hematology Recent Labs  Lab 04/23/21 0339 04/24/21 0701 04/27/21 0232 04/28/21 0134  WBC 15.8*  --  15.8* 16.4*  RBC 3.13*  --  3.23* 3.29*  HGB 8.8* 9.9* 9.0* 9.3*  HCT 27.8* 30.3* 28.1* 28.2*  MCV 88.8  --  87.0 85.7  MCH 28.1  --  27.9 28.3  MCHC 31.7  --  32.0 33.0  RDW 16.3*  --  15.7* 15.4  PLT 581*  --  571* 603*   Thyroid  Recent Labs  Lab 04/22/21 0207 04/22/21 1612  TSH 0.315*  --   FREET4  --  1.12    BNPNo results for input(s): BNP, PROBNP in the last 168 hours.  DDimer No results for input(s): DDIMER in the last 168 hours.   Radiology    DG CHEST PORT 1 VIEW  Result Date: 04/28/2021 CLINICAL DATA:  Shortness of breath EXAM: PORTABLE CHEST 1 VIEW COMPARISON:  Previous studies including the  examination of 04/26/2021 FINDINGS: Transverse diameter of heart is increased. There is partial clearing of increased interstitial and alveolar markings in both lungs suggesting resolving pulmonary edema. There is mild residual prominence of interstitial markings in the parahilar regions and lower lung fields. There are no new focal infiltrates. There is no significant pleural effusion or pneumothorax. Surgical clips are seen in the thyroid bed. IMPRESSION: There is significant interval decrease in interstitial and alveolar densities in both lungs suggesting resolving pulmonary edema. There is residual prominence of interstitial markings in the parahilar regions and lower lung fields suggesting residual pulmonary edema. No new focal pulmonary consolidation is seen. Electronically Signed   By: Elmer Picker M.D.   On: 04/28/2021 08:13    Cardiac Studies   Limited Echocardiogram 2021-05-20: Impressions:  1. Left ventricular ejection fraction, by estimation, is 45 to 50%. The  left ventricle has mildly decreased function. There is moderate left  ventricular hypertrophy.   2. Right ventricular systolic function is normal. The right ventricular  size is normal.   3. Left atrial size was severely dilated.   4. Right atrial size was moderately dilated.   5. The inferior vena cava is normal in size with greater than 50%  respiratory variability, suggesting right atrial pressure of 3 mmHg.   6. Limited echo evaluate LV function.   Patient Profile     65 y.o. male with a history of presumed CAD, mild cardiomyopathy with normalization of EF on Echo in 12/2020, paroxysmal atrial fibrillation on Eliquis, PAD s/p bilateral AKA in 2018, hypertension, hyperlipidemia, type 2 diabetes mellitus, hypothyroidism, CKD stage III, polysubstance abuse (cocaine, alcohol, tobacco), and poor compliance who was admitted on 04/19/2021 with acute hypoxic respiratory failure secondary  to community acquired pneumonia and CHF.  Cardiology consulted for further evaluation of CHF.  Assessment & Plan    Acute on Chronic CHF with Mildly Reduced EF - will hold additional lasix today with AKI, resume oral diuresis tomorrow, would use 80 mg po BID lasix.  - Continue Toprol-XL 100mg  daily. - Continue BiDil 20-37.5mg  three times daily. Of note, insurance will not cover BiDil at discharge. Can continue during admission but will need to prescribe isosorbide dinitrate and hydralazine separately at discharge. - No ACEi/ARB given renal function. - Continue daily weights, strict I/Os, and renal function. - Patient felt to likely have ischemic cardiomyopathy but felt to be high risk for cardiac catheterization due to renal function so medical management has been recommended.  Elevated Troponin Presumed CAD - High-sensitivity troponin minimally elevated and flat at 52 >> 55 >> 55 >> 56. Not consistent with ACS. - Previous Echo in 2019 showed LVEF of 45-50% with hypokinesis of the basal-mid inferior and basal-mid anteroseptal myocardium. EF normalized on repeat Echo but is now back down to 45-50%. He has presumed CAD but has never undergone ischemic evaluation. Considered hgh risk for cardiac catheterization due to renal function so medical therapy has been recommended. - No chest pain. - No Aspirin given need for DOAC. - Continue beta-blocker and high-intensity statin.  Paroxysmal Atrial Fibrillation - Maintaining sinus rhythm but having frequent ectopy on telemetry. - Potassium 3.5 today. Kcl 40 meQ po Bid for 3 doses.  - Contineu Toprol-XL 100mg  daily. - Continue Eliquis 5mg  twice daily.  PAD  - S/p bilateral above knee amputations in 2018. - No Aspiring given need for DOAC. - Continue statin.  Hypertension - BP well controlled.  - Continue medications for CHF.  Hyperlipidemia - Continue Lipitor 40mg  daily.  Type 2 Diabetes Mellitus - Hemoglobin A1c 7.9. - Management per primary team.  Acute on CKD Stage III -  Creatinine 1.89 on admission, now 2.89, can hold further IV today and resume oral tomorrow.  - Continue to monitor closely.  Otherwise, per primary team: - Community acquired pneumonia: complete antibiotic therapy - COPD exacerbation - Polysubstance abuse - Hypothyroidism - Anemia of chronic disease - GERD - Malnutrition   For questions or updates, please contact [facility] Please consult [email or web address] for contact info under        Signed, Elouise Munroe, MD  04/28/2021, 1:17 PM

## 2021-04-28 NOTE — Progress Notes (Signed)
Received call back from Dr. Cyd Silence and new orders are being placed on pt to replace Mg and Calcium. F/U labs placed on pt as well for the am.

## 2021-04-28 NOTE — Progress Notes (Signed)
Todd [name] in lab called a critical calcium on pt of 6.3. Dr. Cyd Silence notified about pt.

## 2021-04-28 NOTE — Progress Notes (Signed)
Pt had a 43 bt run of V tach, Bp 102/63, Asymptomatic, Dr. Algis Liming updated, Cardiology Davis Gourd updated, to see pt. Stat K BMP and magnesium level ordered. K level at 3.5.

## 2021-04-28 NOTE — Progress Notes (Signed)
CCMD reported pt is had a of Vtach 16 beats around 2030, and also just had a run of SVT. Pt is asymptomatic at this time. Paged cardiology on call about pt no new orders received at this time.

## 2021-04-28 NOTE — Progress Notes (Addendum)
Received call from Mickel Baas, NP to give patient Potassium 40 meq PO and Metoprolol XL 25 mg NOW. Cardiology aware of pt having persistent Vtach runs along with runs of SVT. Current VS taken and WNL and pt remains asymptomatic at this time. Will continue to monitor this with pt closely.

## 2021-04-28 NOTE — Progress Notes (Addendum)
PROGRESS NOTE  Todd [name]  IHK:[id number] DOB: Dec 21, 1955 DOA: 04/19/2021 PCP: [name], FNP  Brief Narrative: Todd [name] is a 65 y.o. male recently admitted from 01/10/2021 to 01/15/2021 due to chronic A. fib with RVR, with medical history significant for essential hypertension, hypothyroidism, type 2 diabetes mellitus, chronic atrial fibrillation, status post bilateral AKA, history of polysubstance abuse (alcohol, tobacco and cocaine) who presents to [facility] ED from [facility] via EMS due to shortness of breath.  He was noted to be hypoxic, in the 80% on room air at the SNF, neb treatment was provided and patient was placed on a supplemental oxygen via [place] at 2 LPM with minimal improvement.  EMS was activated and on arrival, O2 supplementation was increased to 4 LPM en route to the hospital.   Work-up revealed acute hypoxic respiratory failure, right-sided community-acquired pneumonia, mild pulmonary edema, elevated troponin, elevated BNP greater than 800, acute on chronic diastolic CHF, AKI, non-anion gap metabolic acidosis, and electrolyte abnormalities.  Echocardiogram revealed LV systolic dysfunction and monitoring detected RVR. Lasix was given with subsequently increasing creatinine. Diuresis pushed despite renal impairment due to progressive pulmonary edema on CXR with hypoxia.   Assessment & Plan: Principal Problem:   CAP (community acquired pneumonia) Active Problems:   Essential hypertension   Acquired hypothyroidism   Elevated troponin   Acute respiratory failure with hypoxia (HCC)   Hyperglycemia due to diabetes mellitus (HCC)   Leukocytosis   Reactive thrombocytosis   S/P AKA (above knee amputation) bilateral (HCC)   AKI (acute kidney injury) ([place])   Atrial fibrillation, chronic (HCC)   Hypomagnesemia   Elevated brain natriuretic peptide (BNP) level   Polysubstance abuse ([place])   Noncompliance with medication regimen   Hypoalbuminemia due to  protein-calorie malnutrition (HCC)   GERD (gastroesophageal reflux disease)  Acute hypoxic respiratory failure:  - Presumed to be due to CHF and right-sided pneumonia, COPD exacerbation also possible given previous reports of wheezing.  - Bronchitic changes on CXR with hazy ground glass appearance of right lung. Opacity on right advanced considerably on repeat 11/10 and patchy bilateral opacities (based on personal review and confirmed on radiologist interpretation) worsening on 11/13 with return of hypoxemia, crackles.  -Clinically appears euvolemic. -Follow-up chest x-ray 11/16: Significant improvement in pulmonary edema but there still seems to be some residual pulmonary edema but no new focal pulmonary consolidation. - Weight down from 150 pounds on 11/8 to 145.5 pounds today.  Cardiology to consider transitioning to oral Lasix today or tomorrow. - Continue supplemental oxygen and prn BiPAP for now.  Saturating in the high 90s on 4 L/min, wean oxygen off as tolerated for oxygen saturations greater than 92%.  PAF, multifocal atrial tachycardia:  - Recent admit for RVR.  - Continue metoprolol succinate, increased to 100mg  dose due to persistent MAT per cardiology. - Maintain K >4, Mg >2. Aggressive supplementation ordered, especially with NSVT on tele..  Acute on chronic combined diastolic and systolic CHF, HTN: LVEF 75-64% without RWMA, +LVH, normal IVC. Severe LAE, moderate RAE. BNP elevated.  - Cardiology consulted. D/w Dr. Margaretann Loveless. Will need to push diuresis today and monitor renal function in AM.    - Converted norvasc to bidil. Continue metoprolol succinate.  -As per my communication with Dr. Margaretann Loveless, Cardiology on 11/16: Patient had diastolic dysfunction on echo in July, systolic dysfunction now and the diastolic dysfunction did not just resolve though not commented on most recent echo when she thereby indicates that patient has combined CHF.  diastolic dysfunction on echo in July,  systolic dysfunction now (and the diastolic function didn't just resolve though not commented on on most recent echo)  AKI with NAGMA:  - No abnormality on U/S, though did have trace perinephric fluid on right. Bland sediment with definite proteinuria 1.03. FENa not consistent with prerenal azotemia at 1.78%. Note history of worse AKIs that were associated with worse acidosis in the past.  - Suspect insult was primarily related to vancomycin, now will need continued monitoring with diuresis.   - Continue bicarbonate tabs - Creatinine has steadily increased since 11/13 (2.12 > 2.89).  Now likely related to diuresis IV.  Change Lasix to p.o. and follow BMP closely.  Demand myocardial ischemia:  - Mild troponin elevation with reassuring delta no anginal complaints.  - Defer w/u to cardiology  Right-sided CAP:  - Progressive infiltrates with hypoxia overnight into 11/10. Abx coverage expanded, though infiltrates continue to increase despite improving WBC, reassuring PCT and adequate abx coverage. - Completed azithromycin x5 days. Stopped vanc w/nephrotoxicity and negative MRSA PCR. Complete 7 days of zosyn-last day today.   - Monitor culture data. Blood Cx's NGTD x5 days, no sputum culture obtained.  COPD with exacerbation:  - Given resolution of wheezing and severe steroid-induced hyperglycemia, steroids were stopped. We will continue nebs. Worsening respiratory status as discussed above is concomitant with CXR infiltrates and not wheezing.   Uncontrolled T2DM with steroid-induced hyperglycemia:  - HbA1c 7.9%.  - Now that steroid effect waning, hypoglycemic so stopped basal insulin. Continue TIDWC + SSI novolog.  Polysubstance abuse:  - Previously +alcohol, tobacco, cocaine.  - Cessation counseling. Monitor for withdrawal, continue nicotine patch.   Hypothyroidism:  - Repeat TSH is 0.315, last was 20.52 in September 2022 which was improved from 189 in July in setting of medication  nonadherence. Free T4 is 1.12 which is the ULN.  - In setting of AFib/MAT, we did decrease synthroid dose 181mcg > 138mcg.   Anemia of chronic disease:  - Having a decline in hgb from admission, though all cell lines are down and hgb was above baseline on admission.  - Hemoglobin has stabilized somewhat near baseline.  Hemoglobin stable in the 9 g range.  HTN: Tx as above  PVD s/p bilateral AKA (2018-2019):  - Continue statin, anticoagulation  GERD:  - Continue PPI  Suspected malnutrition: Albumin is 1.8. - Dietitian consulted.   DVT prophylaxis: Eliquis Code Status: Full Family Communication: None at bedside Disposition Plan:  Status is: Inpatient, transfer to higher level of care - PCU  Remains inpatient appropriate because: requires treatment for acute CHF and pneumonia.   Consultants:  Cardiology  Procedures:  Echocardiogram  Antimicrobials: Ceftriaxone, azithromycin > vancomycin, zosyn.  Subjective: Alert and oriented to self, time but not to place [facility] in [place]).  Reports on and off difficulty breathing but no chest pain.  Objective: Vitals:   04/27/21 2220 04/28/21 0514 04/28/21 0741 04/28/21 0944  BP: 109/79 108/68  109/75  Pulse: 63 87  (!) 103  Resp: 14 15    Temp:  98.5 F (36.9 C)    TempSrc:  Oral    SpO2: 99% 99% 97%   Weight:  66 kg    Height:        Intake/Output Summary (Last 24 hours) at 04/28/2021 1020 Last data filed at 04/28/2021 0547 Gross per 24 hour  Intake 161.38 ml  Output 2050 ml  Net -1888.62 ml   Filed Weights   04/26/21 0650 04/27/21  0518 04/28/21 0514  Weight: 69 kg 67.3 kg 66 kg   Gen: Chronically ill-appearing male in no distress.  Lying comfortably propped up in bed Pulm: Clear to auscultation anteriorly.  Slightly diminished in the right base posteriorly but otherwise clear to auscultation posteriorly as well.  No increased work of breathing. CV: S1 and S2 heard, RRR.  No JVD or murmurs.  S/p bilateral  AKA.  Telemetry personally reviewed at bedside: Sinus rhythm with occasional PVCs. GI: Abdomen soft, non-tender, non-distended, with normoactive bowel sounds.  Ext: Warm, dry BL AKA's.  Left stump is healed.  Right stump has a dressing which is clean dry and intact. Skin: No new rashes, lesions or ulcers on visualized skin. Neuro: Alert and oriented. No focal neurological deficits. Psych: Judgement and insight appear fair. Mood euthymic & affect congruent. Behavior is appropriate.    Data Reviewed: I have personally reviewed following labs and imaging studies  CBC: Recent Labs  Lab 04/22/21 0207 04/23/21 0339 04/24/21 0701 04/27/21 0232 04/28/21 0134  WBC 22.1* 15.8*  --  15.8* 16.4*  NEUTROABS  --  12.3*  --  12.8*  --   HGB 10.1* 8.8* 9.9* 9.0* 9.3*  HCT 32.1* 27.8* 30.3* 28.1* 28.2*  MCV 87.7 88.8  --  87.0 85.7  PLT 677* 581*  --  571* 132*   Basic Metabolic Panel: Recent Labs  Lab 04/24/21 0326 04/25/21 0200 04/26/21 0100 04/26/21 0920 04/27/21 0232 04/28/21 0134  NA 140 139 140  --  138 136  K 3.5 3.5 3.4*  --  3.4* 3.5  CL 115* 114* 112*  --  109 105  CO2 15* 15* 16*  --  16* 18*  GLUCOSE 189* 62* 104*  --  143* 149*  BUN 42* 33* 31*  --  35* 42*  CREATININE 2.34* 2.12* 2.19*  --  2.59* 2.89*  CALCIUM 6.9* 6.6* 6.9*  --  6.7* 6.6*  MG  --   --   --  1.8  --  2.1  PHOS 4.3 4.8* 6.3*  --  6.2* 5.2*   GFR: Estimated Creatinine Clearance: 23.5 mL/min (A) (by C-G formula based on SCr of 2.89 mg/dL (H)). Liver Function Tests: Recent Labs  Lab 04/22/21 0207 04/23/21 0339 04/24/21 0326 04/25/21 0200 04/26/21 0100 04/27/21 0232 04/28/21 0134  AST 16  --   --   --   --   --   --   ALT 17  --   --   --   --   --   --   ALKPHOS 124  --   --   --   --   --   --   BILITOT 0.5  --   --   --   --   --   --   PROT 6.7  --   --   --   --   --   --   ALBUMIN 2.4*   < > 1.9* 1.8* 1.8* 1.8* 1.9*   < > = values in this interval not displayed.   CBG: Recent Labs  Lab  04/27/21 0743 04/27/21 1119 04/27/21 1646 04/27/21 2132 04/28/21 0749  GLUCAP 111* 113* 76 163* 231*    Urine analysis:    Component Value Date/Time   COLORURINE YELLOW 04/24/2021 Progress 04/24/2021 0446   APPEARANCEUR Clear 02/09/2018 1352   LABSPEC 1.018 04/24/2021 0446   PHURINE 5.0 04/24/2021 0446   GLUCOSEU NEGATIVE 04/24/2021 0446   HGBUR NEGATIVE  04/24/2021 [place] 04/24/2021 0446   BILIRUBINUR Negative 02/09/2018 1352   KETONESUR NEGATIVE 04/24/2021 0446   PROTEINUR 100 (A) 04/24/2021 0446   NITRITE NEGATIVE 04/24/2021 0446   LEUKOCYTESUR NEGATIVE 04/24/2021 0446   Recent Results (from the past 240 hour(s))  Resp Panel by RT-PCR (Flu A&B, Covid) Nasopharyngeal Swab     Status: None   Collection Time: 04/19/21 12:12 AM   Specimen: Nasopharyngeal Swab; Nasopharyngeal(NP) swabs in vial transport medium  Result Value Ref Range Status   SARS Coronavirus 2 by RT PCR NEGATIVE NEGATIVE Final    Comment: (NOTE) SARS-CoV-2 target nucleic acids are NOT DETECTED.  The SARS-CoV-2 RNA is generally detectable in upper respiratory specimens during the acute phase of infection. The lowest concentration of SARS-CoV-2 viral copies this assay can detect is 138 copies/mL. A negative result does not preclude SARS-Cov-2 infection and should not be used as the sole basis for treatment or other patient management decisions. A negative result may occur with  improper specimen collection/handling, submission of specimen other than nasopharyngeal swab, presence of viral mutation(s) within the areas targeted by this assay, and inadequate number of viral copies(<138 copies/mL). A negative result must be combined with clinical observations, patient history, and epidemiological information. The expected result is Negative.  Fact Sheet for Patients:  EntrepreneurPulse.com.au  Fact Sheet for Healthcare Providers:   IncredibleEmployment.be  This test is no t yet approved or cleared by the [place] FDA and  has been authorized for detection and/or diagnosis of SARS-CoV-2 by FDA under an Emergency Use Authorization (EUA). This EUA will remain  in effect (meaning this test can be used) for the duration of the COVID-19 declaration under Section 564(b)(1) of the Act, 21 U.S.C.section 360bbb-3(b)(1), unless the authorization is terminated  or revoked sooner.       Influenza A by PCR NEGATIVE NEGATIVE Final   Influenza B by PCR NEGATIVE NEGATIVE Final    Comment: (NOTE) The Xpert Xpress SARS-CoV-2/FLU/RSV plus assay is intended as an aid in the diagnosis of influenza from Nasopharyngeal swab specimens and should not be used as a sole basis for treatment. Nasal washings and aspirates are unacceptable for Xpert Xpress SARS-CoV-2/FLU/RSV testing.  Fact Sheet for Patients: EntrepreneurPulse.com.au  Fact Sheet for Healthcare Providers: IncredibleEmployment.be  This test is not yet approved or cleared by the [place] FDA and has been authorized for detection and/or diagnosis of SARS-CoV-2 by FDA under an Emergency Use Authorization (EUA). This EUA will remain in effect (meaning this test can be used) for the duration of the COVID-19 declaration under Section 564(b)(1) of the Act, 21 U.S.C. section 360bbb-3(b)(1), unless the authorization is terminated or revoked.  Performed at [facility], [address], [place], [place] [address]   Culture, blood (Routine X 2) w Reflex to ID Panel     Status: None   Collection Time: 04/20/21 12:52 AM   Specimen: BLOOD  Result Value Ref Range Status   Specimen Description BLOOD LEFT ANTECUBITAL  Final   Special Requests   Final    BOTTLES DRAWN AEROBIC AND ANAEROBIC Blood Culture adequate volume   Culture   Final    NO GROWTH 5 DAYS Performed at [facility], [address] [place], [place]  [address]    Report Status 04/25/2021 FINAL  Final  Culture, blood (Routine X 2) w Reflex to ID Panel     Status: None   Collection Time: 04/20/21 12:52 AM   Specimen: BLOOD  Result Value Ref Range  Status   Specimen Description BLOOD BLOOD LEFT HAND  Final   Special Requests   Final    BOTTLES DRAWN AEROBIC AND ANAEROBIC Blood Culture adequate volume   Culture   Final    NO GROWTH 5 DAYS Performed at [facility], [address], [place], [place] [address]    Report Status 04/25/2021 FINAL  Final  MRSA Next Gen by PCR, Nasal     Status: None   Collection Time: 04/22/21  4:40 PM   Specimen: Nasal Mucosa; Nasal Swab  Result Value Ref Range Status   MRSA by PCR Next Gen NOT DETECTED NOT DETECTED Final    Comment: (NOTE) The GeneXpert MRSA Assay (FDA approved for NASAL specimens only), is one component of a comprehensive MRSA colonization surveillance program. It is not intended to diagnose MRSA infection nor to guide or monitor treatment for MRSA infections. Test performance is not FDA approved in patients less than 51 years old. Performed at [facility], [place] [address], [place], [place] [address]       Radiology Studies: DG CHEST PORT 1 VIEW  Result Date: 04/28/2021 CLINICAL DATA:  Shortness of breath EXAM: PORTABLE CHEST 1 VIEW COMPARISON:  Previous studies including the examination of 04/26/2021 FINDINGS: Transverse diameter of heart is increased. There is partial clearing of increased interstitial and alveolar markings in both lungs suggesting resolving pulmonary edema. There is mild residual prominence of interstitial markings in the parahilar regions and lower lung fields. There are no new focal infiltrates. There is no significant pleural effusion or pneumothorax. Surgical clips are seen in the thyroid bed. IMPRESSION: There is significant interval decrease in interstitial and alveolar densities in both lungs suggesting resolving pulmonary edema. There is residual prominence  of interstitial markings in the parahilar regions and lower lung fields suggesting residual pulmonary edema. No new focal pulmonary consolidation is seen. Electronically Signed   By: Elmer Picker M.D.   On: 04/28/2021 08:13   DG CHEST PORT 1 VIEW  Result Date: 04/26/2021 CLINICAL DATA:  Acute respiratory failure with hypoxia. EXAM: PORTABLE CHEST 1 VIEW COMPARISON:  04/25/2021 FINDINGS: Heart size is accentuated by technique and stable. Patchy infiltrates are again identified throughout the lungs, some interval improvement in aeration. RIGHT pleural effusion is small. IMPRESSION: Slightly improved aeration. Electronically Signed   By: Nolon Nations M.D.   On: 04/26/2021 10:42    Scheduled Meds:  apixaban  5 mg Oral BID   atorvastatin  40 mg Oral Daily   dextromethorphan-guaiFENesin  1 tablet Oral BID   folic acid  1 mg Oral Daily   furosemide  80 mg Intravenous BID   insulin aspart  0-5 Units Subcutaneous QHS   insulin aspart  0-9 Units Subcutaneous TID WC   insulin aspart  3 Units Subcutaneous TID WC   ipratropium-albuterol  3 mL Nebulization BID   isosorbide-hydrALAZINE  1 tablet Oral TID   levothyroxine  100 mcg Oral QAC breakfast   metoprolol succinate  100 mg Oral Daily   multivitamin with minerals  1 tablet Oral Daily   pantoprazole  40 mg Oral BID   potassium chloride  40 mEq Oral BID   sodium bicarbonate  1,300 mg Oral TID   thiamine  100 mg Oral Daily   Continuous Infusions:  sodium chloride Stopped (04/20/21 0916)   piperacillin-tazobactam (ZOSYN)  IV 3.375 g (04/28/21 0958)     LOS: 8 days   Time spent: 35 minutes. Vernell Leep, MD, [place], [facility]. [facility]  To contact the attending provider between 7A-7P or the covering provider during after hours 7P-7A, please log into the web site [email or web address] and access using universal [place] password for that web site. If you do not have the password, please call the hospital operator.

## 2021-04-28 NOTE — TOC Progression Note (Signed)
Transition of Care [facility]) - Progression Note    Patient Details  Name: Anders Hohmann MRN: [id number] Date of Birth: 1955/12/20  Transition of Care [facility]) CM/SW [place], [place] Phone Number: 04/28/2021, 10:18 AM  Clinical Narrative:     CSW continues to follow patient until medically ready for dc. Patient has SNF bed at [facility]. CSW will continue to follow and assist with patients dc planning needs.    Barriers to Discharge: Continued Medical Work up  Expected Discharge Plan and Services           Expected Discharge Date: 04/25/21                                     Social Determinants of Health (SDOH) Interventions    Readmission Risk Interventions No flowsheet data found.

## 2021-04-29 DIAGNOSIS — I472 Ventricular tachycardia, unspecified: Secondary | ICD-10-CM

## 2021-04-29 DIAGNOSIS — R778 Other specified abnormalities of plasma proteins: Secondary | ICD-10-CM | POA: Diagnosis not present

## 2021-04-29 DIAGNOSIS — I5043 Acute on chronic combined systolic (congestive) and diastolic (congestive) heart failure: Secondary | ICD-10-CM

## 2021-04-29 DIAGNOSIS — J189 Pneumonia, unspecified organism: Secondary | ICD-10-CM | POA: Diagnosis not present

## 2021-04-29 DIAGNOSIS — I4729 Other ventricular tachycardia: Secondary | ICD-10-CM

## 2021-04-29 DIAGNOSIS — N179 Acute kidney failure, unspecified: Secondary | ICD-10-CM | POA: Diagnosis not present

## 2021-04-29 DIAGNOSIS — Z8679 Personal history of other diseases of the circulatory system: Secondary | ICD-10-CM

## 2021-04-29 LAB — CBC
HCT: 27.4 % — ABNORMAL LOW (ref 39.0–52.0)
Hemoglobin: 9 g/dL — ABNORMAL LOW (ref 13.0–17.0)
MCH: 28.4 pg (ref 26.0–34.0)
MCHC: 32.8 g/dL (ref 30.0–36.0)
MCV: 86.4 fL (ref 80.0–100.0)
Platelets: 617 10*3/uL — ABNORMAL HIGH (ref 150–400)
RBC: 3.17 MIL/uL — ABNORMAL LOW (ref 4.22–5.81)
RDW: 15.4 % (ref 11.5–15.5)
WBC: 14.7 10*3/uL — ABNORMAL HIGH (ref 4.0–10.5)
nRBC: 0 % (ref 0.0–0.2)

## 2021-04-29 LAB — BASIC METABOLIC PANEL
Anion gap: 13 (ref 5–15)
BUN: 43 mg/dL — ABNORMAL HIGH (ref 8–23)
CO2: 19 mmol/L — ABNORMAL LOW (ref 22–32)
Calcium: 6.5 mg/dL — ABNORMAL LOW (ref 8.9–10.3)
Chloride: 104 mmol/L (ref 98–111)
Creatinine, Ser: 2.91 mg/dL — ABNORMAL HIGH (ref 0.61–1.24)
GFR, Estimated: 23 mL/min — ABNORMAL LOW (ref >60)
Glucose, Bld: 191 mg/dL — ABNORMAL HIGH (ref 70–99)
Potassium: 4.2 mmol/L (ref 3.5–5.1)
Sodium: 136 mmol/L (ref 135–145)

## 2021-04-29 LAB — GLUCOSE, CAPILLARY
Glucose-Capillary: 136 mg/dL — ABNORMAL HIGH (ref 70–99)
Glucose-Capillary: 211 mg/dL — ABNORMAL HIGH (ref 70–99)
Glucose-Capillary: 136 mg/dL — ABNORMAL HIGH (ref 70–99)
Glucose-Capillary: 169 mg/dL — ABNORMAL HIGH (ref 70–99)

## 2021-04-29 LAB — MAGNESIUM: Magnesium: 2.2 mg/dL (ref 1.7–2.4)

## 2021-04-29 MED ORDER — AMIODARONE HCL 200 MG PO TABS
400.0000 mg | ORAL_TABLET | Freq: Two times a day (BID) | ORAL | Status: DC
  Administered 2021-04-29 – 2021-05-11 (×25): 400 mg via ORAL
  Filled 2021-04-29 (×25): qty 2

## 2021-04-29 MED ORDER — AMIODARONE IV BOLUS ONLY 150 MG/100ML
150.0000 mg | Freq: Once | INTRAVENOUS | Status: AC
  Administered 2021-04-29: 12:00:00 150 mg via INTRAVENOUS
  Filled 2021-04-29: qty 100

## 2021-04-29 MED ORDER — ISOSORBIDE DINITRATE 20 MG PO TABS
20.0000 mg | ORAL_TABLET | Freq: Two times a day (BID) | ORAL | Status: DC
Start: 2021-04-29 — End: 2021-05-08
  Administered 2021-04-29 – 2021-05-07 (×17): 20 mg via ORAL
  Filled 2021-04-29 (×19): qty 1

## 2021-04-29 MED ORDER — AMIODARONE HCL 150 MG/3ML IV SOLN
150.0000 mg | Freq: Once | INTRAVENOUS | Status: DC
Start: 2021-04-29 — End: 2021-04-29

## 2021-04-29 NOTE — Progress Notes (Signed)
PROGRESS NOTE  Todd [name]  CWC:[id number] DOB: 1955/07/08 DOA: 04/19/2021 PCP: [name], FNP  Brief Narrative: [name] is a 65 y.o. male recently admitted from 01/10/2021 to 01/15/2021 due to chronic A. fib with RVR, with medical history significant for essential hypertension, hypothyroidism, type 2 diabetes mellitus, chronic atrial fibrillation, status post bilateral AKA, history of polysubstance abuse (alcohol, tobacco and cocaine) who presents to [facility] ED from [facility] via EMS due to shortness of breath.  He was noted to be hypoxic, in the 80% on room air at the SNF, neb treatment was provided and patient was placed on a supplemental oxygen via [place] at 2 LPM with minimal improvement.  EMS was activated and on arrival, O2 supplementation was increased to 4 LPM en route to the hospital.   Work-up revealed acute hypoxic respiratory failure, right-sided community-acquired pneumonia, mild pulmonary edema, elevated troponin, elevated BNP greater than 800, acute on chronic diastolic CHF, AKI, non-anion gap metabolic acidosis, and electrolyte abnormalities.  Echocardiogram revealed LV systolic dysfunction and monitoring detected RVR. Lasix was given with subsequently increasing creatinine. Diuresis pushed despite renal impairment due to progressive pulmonary edema on CXR with hypoxia.   Assessment & Plan: Principal Problem:   CAP (community acquired pneumonia) Active Problems:   Essential hypertension   Acquired hypothyroidism   Elevated troponin   Acute respiratory failure with hypoxia (HCC)   Hyperglycemia due to diabetes mellitus (HCC)   Leukocytosis   Reactive thrombocytosis   S/P AKA (above knee amputation) bilateral (HCC)   AKI (acute kidney injury) ([place])   Atrial fibrillation, chronic (HCC)   Hypomagnesemia   Elevated brain natriuretic peptide (BNP) level   Polysubstance abuse ([place])   Noncompliance with medication regimen   Hypoalbuminemia due to  protein-calorie malnutrition (HCC)   GERD (gastroesophageal reflux disease)  Acute hypoxic respiratory failure:  - Presumed to be due to CHF and right-sided pneumonia, COPD exacerbation also possible given previous reports of wheezing.  -Clinically appears euvolemic. -Follow-up chest x-ray 11/16: Significant improvement in pulmonary edema but there still seems to be some residual pulmonary edema but no new focal pulmonary consolidation. - Weight down from 150 pounds on 11/8 to 144.2 pounds today.  Intake output inaccurate. -Hypoxia seems to have resolved.  It also does not appear to that he has been on BiPAP for last couple of days.  Sustained ventricular tachycardia - Noted on 11/16 evening.  52 beats initially followed by approximately 150 beats per RN.  Asymptomatic. - As per extensive discussion with cardiology, etiology concerning for ischemia.  No prior ischemic evaluation.  CT/MRI/cath all relatively contraindicated in setting of CKD and could certainly push him into ESRD. - Initiated amiodarone IV 150 mg bolus followed by 400 Mg p.o. twice daily. - Requested nephrology consultation to assist in management of AKI and assessing likelihood of renal improvement for possible ischemic evaluation procedures.  PAF, multifocal atrial tachycardia:  - Recent admit for RVR.  - Continue metoprolol succinate, increased to 100mg  dose due to persistent MAT per cardiology.  Now also on amiodarone which was started for sustained VT. - Maintain K >4, Mg >2.  Currently at goal.  Acute on chronic combined diastolic and systolic CHF, HTN:  - LVEF 45-50% without RWMA, +LVH, normal IVC. Severe LAE, moderate RAE. BNP elevated.  - Converted norvasc to bidil. Continue metoprolol succinate.  - As per discussion with cardiology, patient had diastolic dysfunction on echo in July, systolic dysfunction now and the diastolic dysfunction did not just resolve  though not commented on most recent echo when she thereby  indicates that patient has combined CHF.  AKI with NAGMA:  - No abnormality on U/S, though did have trace perinephric fluid on right. Bland sediment with definite proteinuria 1.03. FENa not consistent with prerenal azotemia at 1.78%. Note history of worse AKIs that were associated with worse acidosis in the past.  - Suspect insult was primarily related to vancomycin, now likely related to aggressive IV diuresis. - Continue bicarbonate tabs - Creatinine has steadily increased since 11/13 (2.12 > 2.89 >2.99 > 2.91).   -Lasix changed to 80 mg p.o. twice daily from 11/17. -Requested nephrology consultation.  Of note patient had normal creatinine in August 2022.  So all of this may be acute related to cardiorenal syndrome initially followed by AKI related to aggressive IV diuresis.  Hopefully his renal functions recover so he can undergo further cardiac ischemic evaluation as noted above.  Demand myocardial ischemia:  - Mild troponin elevation with reassuring delta no anginal complaints.  - Defer w/u to cardiology  Right-sided CAP:  - Progressive infiltrates with hypoxia overnight into 11/10. Abx coverage expanded, though infiltrates continue to increase despite improving WBC, reassuring PCT and adequate abx coverage. - Completed azithromycin x5 days. Stopped vanc w/nephrotoxicity and negative MRSA PCR. Complete 7 days of zosyn completed-discontinued. - Blood Cx's NGTD x5 days, no sputum culture obtained.  COPD with exacerbation:  - Given resolution of wheezing and severe steroid-induced hyperglycemia, steroids were stopped. We will continue nebs. Worsening respiratory status as discussed above is concomitant with CXR infiltrates and not wheezing.   Uncontrolled T2DM with steroid-induced hyperglycemia:  - HbA1c 7.9%.  - Now that steroid effect waning, hypoglycemic so stopped basal insulin. Continue TIDWC + SSI novolog.  Polysubstance abuse:  - Previously +alcohol, tobacco, cocaine.  -  Cessation counseling. Monitor for withdrawal, continue nicotine patch.   Hypothyroidism:  - Repeat TSH is 0.315, last was 20.52 in September 2022 which was improved from 189 in July in setting of medication nonadherence. Free T4 is 1.12 which is the ULN.  - In setting of AFib/MAT, we did decrease synthroid dose 15mcg > 176mcg.   Anemia of chronic disease:  - Having a decline in hgb from admission, though all cell lines are down and hgb was above baseline on admission.  - Hemoglobin has stabilized somewhat near baseline.  Hemoglobin stable in the 9 g range.  HTN: Tx as above  PVD s/p bilateral AKA (2018-2019):  - Continue statin, anticoagulation  GERD:  - Continue PPI  Suspected malnutrition: Albumin is 1.8. - Dietitian consulted.   DVT prophylaxis: Eliquis Code Status: Full Family Communication: None at bedside Disposition Plan:  Status is: Inpatient  Remains inpatient appropriate because: requires intensive evaluation and management of acute kidney injury, sustained NSVT.  Consultants:  Cardiology Nephrology  Procedures:  Echocardiogram  Antimicrobials: Ceftriaxone, azithromycin > vancomycin, zosyn.  Subjective: Occasional dyspnea.  No chest pain or palpitations even during episodes of NSVT.  Objective: Vitals:   04/28/21 2014 04/29/21 0432 04/29/21 0800 04/29/21 0931  BP:  125/78 129/81   Pulse:  87 88   Resp:  18 16   Temp:  98.3 F (36.8 C) 98 F (36.7 C)   TempSrc:  Oral Oral   SpO2: 98% 98% 100% 100%  Weight:  65.4 kg    Height:        Intake/Output Summary (Last 24 hours) at 04/29/2021 1402 Last data filed at 04/29/2021 0600 Gross per 24 hour  Intake 778.04 ml  Output 1200 ml  Net -421.96 ml   Filed Weights   04/27/21 0518 04/28/21 0514 04/29/21 0432  Weight: 67.3 kg 66 kg 65.4 kg   Gen: Chronically ill-appearing male in no distress.  Lying comfortably propped up in bed Pulm: Clear to auscultation.  No increased work of breathing. CV: S1  and S2 heard, RRR.  No JVD.  No dependent edema.  S/p bilateral AKA.  Telemetry personally reviewed at bedside: Sinus rhythm with occasional PVCs and PACs.  Couple of episodes of sustained NSVT on 11/16 evening. GI: Abdomen soft, non-tender, non-distended, with normoactive bowel sounds.  Ext: Warm, dry BL AKA's.  Left stump is healed.  Right stump has a dressing which is clean dry and intact. Skin: No new rashes, lesions or ulcers on visualized skin. Neuro: Alert and oriented. No focal neurological deficits. Psych: Judgement and insight appear fair. Mood euthymic & affect congruent. Behavior is appropriate.    Data Reviewed: I have personally reviewed following labs and imaging studies  CBC: Recent Labs  Lab 04/23/21 0339 04/24/21 0701 04/27/21 0232 04/28/21 0134 04/29/21 0433  WBC 15.8*  --  15.8* 16.4* 14.7*  NEUTROABS 12.3*  --  12.8*  --   --   HGB 8.8* 9.9* 9.0* 9.3* 9.0*  HCT 27.8* 30.3* 28.1* 28.2* 27.4*  MCV 88.8  --  87.0 85.7 86.4  PLT 581*  --  571* 603* 938*   Basic Metabolic Panel: Recent Labs  Lab 04/24/21 0326 04/25/21 0200 04/26/21 0100 04/26/21 0920 04/27/21 0232 04/28/21 0134 04/28/21 1905 04/29/21 0433  NA 140 139 140  --  138 136 137 136  K 3.5 3.5 3.4*  --  3.4* 3.5 3.7 4.2  CL 115* 114* 112*  --  109 105 105 104  CO2 15* 15* 16*  --  16* 18* 19* 19*  GLUCOSE 189* 62* 104*  --  143* 149* 166* 191*  BUN 42* 33* 31*  --  35* 42* 44* 43*  CREATININE 2.34* 2.12* 2.19*  --  2.59* 2.89* 2.99* 2.91*  CALCIUM 6.9* 6.6* 6.9*  --  6.7* 6.6* 6.3* 6.5*  MG  --   --   --  1.8  --  2.1 1.8 2.2  PHOS 4.3 4.8* 6.3*  --  6.2* 5.2*  --   --    GFR: Estimated Creatinine Clearance: 23.1 mL/min (A) (by C-G formula based on SCr of 2.91 mg/dL (H)). Liver Function Tests: Recent Labs  Lab 04/24/21 0326 04/25/21 0200 04/26/21 0100 04/27/21 0232 04/28/21 0134  ALBUMIN 1.9* 1.8* 1.8* 1.8* 1.9*   CBG: Recent Labs  Lab 04/28/21 1124 04/28/21 1612 04/28/21 2151  04/29/21 0758 04/29/21 1135  GLUCAP 139* 127* 212* 136* 211*    Urine analysis:    Component Value Date/Time   COLORURINE YELLOW 04/24/2021 [place] 04/24/2021 0446   APPEARANCEUR Clear 02/09/2018 1352   LABSPEC 1.018 04/24/2021 0446   PHURINE 5.0 04/24/2021 [place] 04/24/2021 0446   HGBUR NEGATIVE 04/24/2021 0446   BILIRUBINUR NEGATIVE 04/24/2021 0446   BILIRUBINUR Negative 02/09/2018 [place] 04/24/2021 0446   PROTEINUR 100 (A) 04/24/2021 0446   NITRITE NEGATIVE 04/24/2021 0446   LEUKOCYTESUR NEGATIVE 04/24/2021 0446   Recent Results (from the past 240 hour(s))  Culture, blood (Routine X 2) w Reflex to ID Panel     Status: None   Collection Time: 04/20/21 12:52 AM   Specimen: BLOOD  Result Value Ref  Range Status   Specimen Description BLOOD LEFT ANTECUBITAL  Final   Special Requests   Final    BOTTLES DRAWN AEROBIC AND ANAEROBIC Blood Culture adequate volume   Culture   Final    NO GROWTH 5 DAYS Performed at [facility], [address], [place], [place] [address]    Report Status 04/25/2021 FINAL  Final  Culture, blood (Routine X 2) w Reflex to ID Panel     Status: None   Collection Time: 04/20/21 12:52 AM   Specimen: BLOOD  Result Value Ref Range Status   Specimen Description BLOOD BLOOD LEFT HAND  Final   Special Requests   Final    BOTTLES DRAWN AEROBIC AND ANAEROBIC Blood Culture adequate volume   Culture   Final    NO GROWTH 5 DAYS Performed at [facility], [address], [place], [place] [address]    Report Status 04/25/2021 FINAL  Final  MRSA Next Gen by PCR, Nasal     Status: None   Collection Time: 04/22/21  4:40 PM   Specimen: Nasal Mucosa; Nasal Swab  Result Value Ref Range Status   MRSA by PCR Next Gen NOT DETECTED NOT DETECTED Final    Comment: (NOTE) The GeneXpert MRSA Assay (FDA approved for NASAL specimens only), is one component of a comprehensive MRSA colonization surveillance program. It  is not intended to diagnose MRSA infection nor to guide or monitor treatment for MRSA infections. Test performance is not FDA approved in patients less than 63 years old. Performed at [facility], [place] [address], [place], [place] [address]       Radiology Studies: DG CHEST PORT 1 VIEW  Result Date: 04/28/2021 CLINICAL DATA:  Shortness of breath EXAM: PORTABLE CHEST 1 VIEW COMPARISON:  Previous studies including the examination of 04/26/2021 FINDINGS: Transverse diameter of heart is increased. There is partial clearing of increased interstitial and alveolar markings in both lungs suggesting resolving pulmonary edema. There is mild residual prominence of interstitial markings in the parahilar regions and lower lung fields. There are no new focal infiltrates. There is no significant pleural effusion or pneumothorax. Surgical clips are seen in the thyroid bed. IMPRESSION: There is significant interval decrease in interstitial and alveolar densities in both lungs suggesting resolving pulmonary edema. There is residual prominence of interstitial markings in the parahilar regions and lower lung fields suggesting residual pulmonary edema. No new focal pulmonary consolidation is seen. Electronically Signed   By: Elmer Picker M.D.   On: 04/28/2021 08:13    Scheduled Meds:  amiodarone  400 mg Oral BID   apixaban  5 mg Oral BID   atorvastatin  40 mg Oral Daily   dextromethorphan-guaiFENesin  1 tablet Oral BID   folic acid  1 mg Oral Daily   furosemide  80 mg Oral BID   insulin aspart  0-5 Units Subcutaneous QHS   insulin aspart  0-9 Units Subcutaneous TID WC   insulin aspart  3 Units Subcutaneous TID WC   ipratropium-albuterol  3 mL Nebulization BID   isosorbide-hydrALAZINE  1 tablet Oral TID   levothyroxine  100 mcg Oral QAC breakfast   metoprolol succinate  100 mg Oral Daily   multivitamin with minerals  1 tablet Oral Daily   pantoprazole  40 mg Oral BID   potassium chloride  40 mEq  Oral BID   sodium bicarbonate  1,300 mg Oral TID   thiamine  100 mg Oral Daily   Continuous Infusions:  sodium chloride Stopped (  04/20/21 0916)     LOS: 9 days   Time spent: 35 minutes. Vernell Leep, MD, [place], [facility]. [facility]  To contact the attending provider between 7A-7P or the covering provider during after hours 7P-7A, please log into the web site [email or web address] and access using universal [place] password for that web site. If you do not have the password, please call the hospital operator.

## 2021-04-29 NOTE — Progress Notes (Deleted)
Patient has been refusing Bipap

## 2021-04-29 NOTE — Progress Notes (Signed)
Progress Note  Patient Name: Todd [name] Date of Encounter: 04/29/2021  [facility] Cardiologist: Minus Breeding, MD   Subjective   Sustained ventricular tachycardia, likely ischemic with grossly normal electrolytes.   Inpatient Medications    Scheduled Meds:  amiodarone  150 mg Intravenous Once   amiodarone  400 mg Oral BID   apixaban  5 mg Oral BID   atorvastatin  40 mg Oral Daily   dextromethorphan-guaiFENesin  1 tablet Oral BID   folic acid  1 mg Oral Daily   furosemide  80 mg Oral BID   insulin aspart  0-5 Units Subcutaneous QHS   insulin aspart  0-9 Units Subcutaneous TID WC   insulin aspart  3 Units Subcutaneous TID WC   ipratropium-albuterol  3 mL Nebulization BID   isosorbide-hydrALAZINE  1 tablet Oral TID   levothyroxine  100 mcg Oral QAC breakfast   metoprolol succinate  100 mg Oral Daily   multivitamin with minerals  1 tablet Oral Daily   pantoprazole  40 mg Oral BID   potassium chloride  40 mEq Oral BID   sodium bicarbonate  1,300 mg Oral TID   thiamine  100 mg Oral Daily   Continuous Infusions:  sodium chloride Stopped (04/20/21 0916)   PRN Meds: sodium chloride, acetaminophen, ipratropium-albuterol, nicotine   Vital Signs    Vitals:   04/28/21 2014 04/29/21 0432 04/29/21 0800 04/29/21 0931  BP:  125/78 129/81   Pulse:  87 88   Resp:  18 16   Temp:  98.3 F (36.8 C) 98 F (36.7 C)   TempSrc:  Oral Oral   SpO2: 98% 98% 100% 100%  Weight:  65.4 kg    Height:        Intake/Output Summary (Last 24 hours) at 04/29/2021 1014 Last data filed at 04/29/2021 0600 Gross per 24 hour  Intake 778.04 ml  Output 1200 ml  Net -421.96 ml   Last 3 Weights 04/29/2021 04/28/2021 04/27/2021  Weight (lbs) 144 lb 3.2 oz 145 lb 8.1 oz 148 lb 5.9 oz  Weight (kg) 65.409 kg 66 kg 67.3 kg      Telemetry    Normal sinus rhythm with rates in the 80s to 90s. Runs of sustained VT and PVCs - Personally Reviewed  ECG    pending - Personally  Reviewed  Physical Exam   GEN: No acute distress.   Neck: No JVD Cardiac: RRR, no murmurs, rubs, or gallops.  Respiratory: Clear to auscultation bilaterally. GI: Soft, nontender, non-distended  MS: BL AKA Neuro:  Nonfocal  Psych: Normal affect  Labs    High Sensitivity Troponin:   Recent Labs  Lab 04/19/21 0019 04/20/21 0254 04/20/21 0502 04/20/21 0639  TROPONINIHS 52* 55* 55* 56*     Chemistry Recent Labs  Lab 04/26/21 0100 04/26/21 0920 04/27/21 0232 04/28/21 0134 04/28/21 1905 04/29/21 0433  NA 140  --  138 136 137 136  K 3.4*  --  3.4* 3.5 3.7 4.2  CL 112*  --  109 105 105 104  CO2 16*  --  16* 18* 19* 19*  GLUCOSE 104*  --  143* 149* 166* 191*  BUN 31*  --  35* 42* 44* 43*  CREATININE 2.19*  --  2.59* 2.89* 2.99* 2.91*  CALCIUM 6.9*  --  6.7* 6.6* 6.3* 6.5*  MG  --    < >  --  2.1 1.8 2.2  ALBUMIN 1.8*  --  1.8* 1.9*  --   --  GFRNONAA 33*  --  26* 23* 22* 23*  ANIONGAP 12  --  13 13 13 13    < > = values in this interval not displayed.    Lipids No results for input(s): CHOL, TRIG, HDL, LABVLDL, LDLCALC, CHOLHDL in the last 168 hours.  Hematology Recent Labs  Lab 04/27/21 0232 04/28/21 0134 04/29/21 0433  WBC 15.8* 16.4* 14.7*  RBC 3.23* 3.29* 3.17*  HGB 9.0* 9.3* 9.0*  HCT 28.1* 28.2* 27.4*  MCV 87.0 85.7 86.4  MCH 27.9 28.3 28.4  MCHC 32.0 33.0 32.8  RDW 15.7* 15.4 15.4  PLT 571* 603* 617*   Thyroid  Recent Labs  Lab 04/22/21 1612  FREET4 1.12    BNPNo results for input(s): BNP, PROBNP in the last 168 hours.  DDimer No results for input(s): DDIMER in the last 168 hours.   Radiology    DG CHEST PORT 1 VIEW  Result Date: 04/28/2021 CLINICAL DATA:  Shortness of breath EXAM: PORTABLE CHEST 1 VIEW COMPARISON:  Previous studies including the examination of 04/26/2021 FINDINGS: Transverse diameter of heart is increased. There is partial clearing of increased interstitial and alveolar markings in both lungs suggesting resolving pulmonary  edema. There is mild residual prominence of interstitial markings in the parahilar regions and lower lung fields. There are no new focal infiltrates. There is no significant pleural effusion or pneumothorax. Surgical clips are seen in the thyroid bed. IMPRESSION: There is significant interval decrease in interstitial and alveolar densities in both lungs suggesting resolving pulmonary edema. There is residual prominence of interstitial markings in the parahilar regions and lower lung fields suggesting residual pulmonary edema. No new focal pulmonary consolidation is seen. Electronically Signed   By: Elmer Picker M.D.   On: 04/28/2021 08:13    Cardiac Studies   Limited Echocardiogram 2021/04/26: Impressions:  1. Left ventricular ejection fraction, by estimation, is 45 to 50%. The  left ventricle has mildly decreased function. There is moderate left  ventricular hypertrophy.   2. Right ventricular systolic function is normal. The right ventricular  size is normal.   3. Left atrial size was severely dilated.   4. Right atrial size was moderately dilated.   5. The inferior vena cava is normal in size with greater than 50%  respiratory variability, suggesting right atrial pressure of 3 mmHg.   6. Limited echo evaluate LV function.   Patient Profile     65 y.o. male with a history of presumed CAD, mild cardiomyopathy with normalization of EF on Echo in 12/2020, paroxysmal atrial fibrillation on Eliquis, PAD s/p bilateral AKA in 2018, hypertension, hyperlipidemia, type 2 diabetes mellitus, hypothyroidism, CKD stage III, polysubstance abuse (cocaine, alcohol, tobacco), and poor compliance who was admitted on 04/19/2021 with acute hypoxic respiratory failure secondary to community acquired pneumonia and CHF. Cardiology consulted for further evaluation of CHF.  Assessment & Plan    Acute on Chronic CHF with Mildly Reduced EF Sustained ventricular tachycardia - concerning for ischemic etiology with  no prior ischemic evaluation. CT/MRI/CATH all relatively contraindicated in setting of CKD. Could consider nuclear study for risk stratification however with severe PAD and reduced EF, most likely etiology for HF is ischemic.  - now having sustained episodes of VT, will initiate amiodarone IV 150 mg bolus and 400 mg po BID to load for VT. BB titration somewhat challenging with borderline low blood pressure. - lasix 80 mg po BID - Continue Toprol-XL 100mg  daily. - Continue BiDil 20-37.5mg  three times daily. Of note, insurance will not  cover BiDil at discharge. Can continue during admission but will need to prescribe isosorbide dinitrate and hydralazine separately at discharge. - No ACEi/ARB given renal function. - Continue daily weights, strict I/Os, and renal function. - Patient felt to likely have ischemic cardiomyopathy but felt to be high risk for cardiac catheterization due to renal function so medical management has been recommended.  Elevated Troponin Presumed CAD - High-sensitivity troponin minimally elevated and flat at 52 >> 55 >> 55 >> 56. Not consistent with ACS. - EF borderline at 45%, never had a cath before. - No chest pain. - No Aspirin given need for DOAC. - Continue beta-blocker and high-intensity statin.  Paroxysmal Atrial Fibrillation - Maintaining sinus rhythm now having sustained VT. - Potassium 4.2 today.  - Contineu Toprol-XL 100mg  daily. - Continue Eliquis 5mg  twice daily.  PAD  - S/p bilateral above knee amputations in 2018. - No Aspiring given need for DOAC. - Continue statin.  Hypertension - BP well controlled.  - Continue medications for CHF.  Hyperlipidemia - Continue Lipitor 40mg  daily.  Type 2 Diabetes Mellitus - Hemoglobin A1c 7.9. - Management per primary team.  Acute on CKD Stage III - Creatinine 1.89 on admission - Continue to monitor closely.  Otherwise, per primary team: - Community acquired pneumonia: complete antibiotic therapy -  COPD exacerbation - Polysubstance abuse - Hypothyroidism - Anemia of chronic disease - GERD - Malnutrition   For questions or updates, please contact [facility] Please consult [email or web address] for contact info under        Signed, Elouise Munroe, MD  04/29/2021, 10:14 AM

## 2021-04-29 NOTE — Progress Notes (Signed)
Nutrition Follow-up  DOCUMENTATION CODES:   Not applicable  INTERVENTION:   Continue Snacks TID between meals. Continue MVI with minerals daily.  NUTRITION DIAGNOSIS:   Increased nutrient needs related to chronic illness (CHF) as evidenced by estimated needs.  GOAL:   Patient will meet greater than or equal to 90% of their needs  MONITOR:   PO intake, Labs  REASON FOR ASSESSMENT:   Consult Assessment of nutrition requirement/status  ASSESSMENT:   65 y.o. male presented to the ED from a SNF with SOB. PMH includes HTN, CHF, T2DM, bilateral AKA, and EtOH abuse. Pt admitted with community acquired pneumonia.  Electrophysiology team is now following d/t sustained VT last night. Amiodarone ordered.  Patient states he is eating very good. He receives snacks between meals sometimes. He declines any supplements at this time.  Remains on a heart healthy carb modified diet. Meal intakes: 50-100% (average 88% x past 4 meals recorded)  Labs reviewed.  CBG: 136-211  Medications reviewed and include folic acid, Lasix, Novolog, MVI with minerals, Protonix, Klor-Con, thiamine.  Admission weight 68.2 kg (11/8) Current weight 65.4 kg I/O -6.4 L since admission  Diet Order:   Diet Order             Diet heart healthy/carb modified Room service appropriate? Yes; Fluid consistency: Thin  Diet effective now                   EDUCATION NEEDS:   No education needs have been identified at this time  Skin:  Skin Assessment: Reviewed RN Assessment  Last BM:  11/15  Height:   Ht Readings from Last 1 Encounters:  04/19/21 5\' 8"  (1.727 m)    Weight:   Wt Readings from Last 1 Encounters:  04/29/21 65.4 kg    BMI:  Body mass index is 21.93 kg/m.  Estimated Nutritional Needs:   Kcal:  1800-2000  Protein:  90-100 gm  Fluid:  1.8-2 L    Lucas Mallow, RD, LDN, CNSC Please refer to Amion for contact information.

## 2021-04-29 NOTE — Consult Note (Signed)
ELECTROPHYSIOLOGY CONSULT NOTE    Patient ID: Todd [name] MRN: [id number], DOB/AGE: Oct 01, 1955 65 y.o.  Admit date: 04/19/2021 Date of Consult: 04/29/2021  Primary Physician: [name], FNP Primary Cardiologist: Minus Breeding, MD  Electrophysiologist:  New to Dr. [name]  Referring Provider: Dr. Margaretann Loveless  Patient Profile: 65 y.o. male with a history of presumed CAD, mild cardiomyopathy with normalization of EF on Echo in 12/2020, paroxysmal atrial fibrillation on Eliquis, PAD s/p bilateral AKA in 2018, hypertension, hyperlipidemia, type 2 diabetes mellitus, hypothyroidism, CKD stage III, polysubstance abuse (cocaine, alcohol, tobacco), and poor compliance who was admitted on 04/19/2021 with acute hypoxic respiratory failure secondary to community acquired pneumonia and CHF. EP assessment requested due to runs of sustained VT on telemetry  HPI:  Todd [name] is a 65 y.o. male with complicated medical history as above.  He presented to ED [facility]/21 from [facility] with SOB found to be hypoxic. SOB started between Sunday and Monday night. He was admitted for acute respiratory failure with hypoxia secondary to CAP. BNP was elevated to 860. HS troponin mild and flat felt due to demand ischemia. Acute on chronic kidney injury noted on arrival.   Mg 1.6 --> 2.5 K 4.0 A1c 7.9% sCr 1.69 (1.89) -- previously normal  Albumin 2.4 WBC 17.5 --> 16.3 HS troponin 55 --> 56 BNP 859 COVID negative TSH 0.315, Free T4 1.12   Echocardiogram 11/8 showed LVEF 45-50%, moderate LVH, severely dilated left atrium. HK in basal-mid inferior and basal-mid anteroseptal myocardium noted on prior Echo.  Has been managed with adjustment of his GDMT for A/C CHF and on ABX for his CAP by primary team.   Overnight he was noted to have multiple episodes of sustained VT as well as PVCs. EP consulted for the same  No acute complaints at this time. Did have syncope in 12/2020 but in the setting of  polysubstance abuse which confounds. (UDS + for cocaine and opiates at that time)  Past Medical History:  Diagnosis Date   AKI (acute kidney injury) ([place])    Constipated    Diabetes mellitus without complication ([place])    Diarrhea    Elevated LFTs    Goiter    Gout    Hyperlipidemia    Hypertension    Leukocytosis    Reactive thrombocytosis    Right BKA infection ([place]) 08/2016   Right leg pain    Sepsis due to undetermined organism [facility])    Thyroid disease    Wound infection after surgery 08/2016     Surgical History:  Past Surgical History:  Procedure Laterality Date   ABDOMINAL AORTOGRAM N/A 08/11/2016   Procedure: Abdominal Aortogram;  Surgeon: Waynetta Sandy, MD;  Location: [place] [facility];  Service: Cardiovascular;  Laterality: N/A;   ABDOMINAL AORTOGRAM W/LOWER EXTREMITY N/A 08/15/2016   Procedure: Abdominal Aortogram w/Lower Extremity;  Surgeon: Elam Dutch, MD;  Location: [place] [facility];  Service: Cardiovascular;  Laterality: N/A;   AMPUTATION Right 08/17/2016   Procedure: RIGHT BELOW KNEE AMPUTATION;  Surgeon: Elam Dutch, MD;  Location: [place];  Service: Vascular;  Laterality: Right;   AMPUTATION Right 09/12/2016   Procedure: AMPUTATION ABOVE KNEE;  Surgeon: Newt Minion, MD;  Location: [place];  Service: Orthopedics;  Laterality: Right;   AMPUTATION Left 08/12/2016   Procedure: LEFT BELOW KNEE AMPUTATION;  Surgeon: Newt Minion, MD;  Location: [place];  Service: Orthopedics;  Laterality: Left;   AMPUTATION Left 11/01/2017   Procedure: LEFT  ABOVE KNEE AMPUTATION;  Surgeon: Newt Minion, MD;  Location: [place];  Service: Orthopedics;  Laterality: Left;   APPLICATION OF WOUND VAC Right 09/12/2016   Procedure: APPLICATION OF WOUND VAC ABOVE KNEE;  Surgeon: Newt Minion, MD;  Location: [place];  Service: Orthopedics;  Laterality: Right;   BIOPSY  01/13/2021   Procedure: BIOPSY;  Surgeon: Rogene Houston, MD;  [facility];  Service: Endoscopy;;   esophageal   COLONOSCOPY WITH PROPOFOL N/A 01/13/2021   Procedure: COLONOSCOPY WITH PROPOFOL;  Surgeon: Rogene Houston, MD;  [facility];  Service: Endoscopy;  Laterality: N/A;   ESOPHAGOGASTRODUODENOSCOPY (EGD) WITH PROPOFOL N/A 01/13/2021   Procedure: ESOPHAGOGASTRODUODENOSCOPY (EGD) WITH PROPOFOL;  Surgeon: Rogene Houston, MD;  [facility];  Service: Endoscopy;  Laterality: N/A;   LOWER EXTREMITY ANGIOGRAPHY Bilateral 08/11/2016   Procedure: Lower Extremity Angiography;  Surgeon: Waynetta Sandy, MD;  Location: [place] [facility];  Service: Cardiovascular;  Laterality: Bilateral;   PERIPHERAL VASCULAR BALLOON ANGIOPLASTY Left 08/11/2016   Procedure: Peripheral Vascular Balloon Angioplasty;  Surgeon: Waynetta Sandy, MD;  Location: [place] [facility];  Service: Cardiovascular;  Laterality: Left;  SFA   POLYPECTOMY  01/13/2021   Procedure: POLYPECTOMY;  Surgeon: Rogene Houston, MD;  [facility];  Service: Endoscopy;;   THYROID SURGERY       Medications Prior to Admission  Medication Sig Dispense Refill Last Dose   acetaminophen (TYLENOL) 325 MG tablet Take 2 tablets (650 mg total) by mouth every 6 (six) hours as needed for mild pain (or Fever >/= 101).      allopurinol (ZYLOPRIM) 100 MG tablet Take 1 tablet (100 mg total) by mouth daily. (Patient taking differently: Take 300 mg by mouth daily.) 30 tablet 1 04/19/2021   amLODipine (NORVASC) 5 MG tablet Take 1 tablet (5 mg total) by mouth daily. 30 tablet 11 04/19/2021   apixaban (ELIQUIS) 5 MG TABS tablet Take 1 tablet (5 mg total) by mouth 2 (two) times daily. 60 tablet 5 04/19/2021 at 2100   atorvastatin (LIPITOR) 40 MG tablet TAKE 1 TABLET BY MOUTH ONCE DAILY AT 6 PM 30 tablet 1 04/19/2021   calcium carbonate (TUMS - DOSED IN MG ELEMENTAL CALCIUM) 500 MG chewable tablet Chew 2 tablets (400 mg of elemental calcium total) by mouth 2 (two) times daily with a meal. (Patient taking differently: Chew 2  tablets by mouth 3 (three) times daily with meals.) 60 tablet 4 04/19/2021   cetirizine (ZYRTEC) 10 MG tablet Take 1 tablet (10 mg total) by mouth daily as needed for allergies. For allergy symptoms 90 tablet 1    cholecalciferol (VITAMIN D3) 25 MCG (1000 UNIT) tablet Take 1,000 Units by mouth daily.   93/07/6710   folic acid (FOLVITE) 1 MG tablet Take 1 tablet (1 mg total) by mouth daily. 30 tablet 5 04/19/2021   glucose blood (ACCU-CHEK AVIVA PLUS) test strip Check blood sugars four times a day 400 each 3    insulin lispro (HUMALOG) 100 UNIT/ML KwikPen Inject 2-10 Units into the skin. Sliding scale 200-250 2units, 251-300 4units, 301-350 6units, 351-400 8units,401-450 10units   04/19/2021   levothyroxine (SYNTHROID) 112 MCG tablet Take 1 tablet (112 mcg total) by mouth daily before breakfast. 90 tablet 1 04/19/2021   LORazepam (ATIVAN) 0.5 MG tablet Take 1 tablet by mouth every 6 (six) hours as needed.      megestrol (MEGACE) 400 MG/10ML suspension Take 10 mLs (400 mg total)  by mouth 2 (two) times daily. For appetite stimulation 500 mL 1 04/19/2021   metoprolol succinate (TOPROL XL) 25 MG 24 hr tablet Take 1 tablet (25 mg total) by mouth daily. 30 tablet 11 04/19/2021 at 0900   Multiple Vitamin (MULTIVITAMIN WITH MINERALS) TABS tablet Take 1 tablet by mouth daily.   04/19/2021   Nutritional Supplements (NUTRITIONAL SHAKE HIGH PROTEIN) LIQD Take 30 mLs by mouth daily.   04/19/2021   pantoprazole (PROTONIX) 40 MG tablet Take 1 tablet (40 mg total) by mouth 2 (two) times daily.   04/19/2021   polyethylene glycol (MIRALAX / GLYCOLAX) 17 g packet Take 17 g by mouth daily. 14 each 0 04/19/2021   sertraline (ZOLOFT) 50 MG tablet Take 1 tablet (50 mg total) by mouth at bedtime. 30 tablet 1 04/19/2021   sodium bicarbonate 650 MG tablet Take 650 mg by mouth 3 (three) times daily.   04/19/2021   thiamine 100 MG tablet Take 1 tablet (100 mg total) by mouth daily. 30 tablet 3 04/19/2021   Nystatin (GERHARDT'S BUTT CREAM)  CREA Apply 1 application topically 3 (three) times daily. (Patient not taking: No sig reported)   Not Taking   silver sulfADIAZINE (SILVADENE) 1 % cream Apply 1 application topically daily. Apply to buttock (Patient not taking: No sig reported)   Not Taking    Inpatient Medications:   amiodarone  400 mg Oral BID   apixaban  5 mg Oral BID   atorvastatin  40 mg Oral Daily   dextromethorphan-guaiFENesin  1 tablet Oral BID   folic acid  1 mg Oral Daily   furosemide  80 mg Oral BID   insulin aspart  0-5 Units Subcutaneous QHS   insulin aspart  0-9 Units Subcutaneous TID WC   insulin aspart  3 Units Subcutaneous TID WC   ipratropium-albuterol  3 mL Nebulization BID   isosorbide-hydrALAZINE  1 tablet Oral TID   levothyroxine  100 mcg Oral QAC breakfast   metoprolol succinate  100 mg Oral Daily   multivitamin with minerals  1 tablet Oral Daily   pantoprazole  40 mg Oral BID   potassium chloride  40 mEq Oral BID   sodium bicarbonate  1,300 mg Oral TID   thiamine  100 mg Oral Daily    Allergies:  Allergies  Allergen Reactions   Lisinopril Other (See Comments)    Hyperkalemia / Renal failure    Social History   Socioeconomic History   Marital status: Single    Spouse name: Not on file   Number of children: Not on file   Years of education: Not on file   Highest education level: Not on file  Occupational History   Occupation: retired    Comment: drove a Medical illustrator  Tobacco Use   Smoking status: Every Day    Packs/day: 0.25    Years: 45.00    Pack years: 11.25    Types: Cigarettes    Last attempt to quit: 11/12/2014    Years since quitting: 6.4   Smokeless tobacco: Never  Vaping Use   Vaping Use: Never used  Substance and Sexual Activity   Alcohol use: Yes    Alcohol/week: 2.0 standard drinks    Types: 1 Glasses of wine, 1 Cans of beer per week   Drug use: No   Sexual activity: Not Currently  Other Topics Concern   Not on file  Social History Narrative   09/23/20 - Lives  alone, uses a wheelchair, double amputee, not married, no  children - HHA comes in 3x per week to help him bathe and set up weekly meds.   Social Determinants of Health   Financial Resource Strain: Low Risk    Difficulty of Paying Living Expenses: Not very hard  Food Insecurity: No Food Insecurity   Worried About Charity fundraiser in the Last Year: Never true   Ran Out of Food in the Last Year: Never true  Transportation Needs: No Transportation Needs   Lack of Transportation (Medical): No   Lack of Transportation (Non-Medical): No  Physical Activity: Inactive   Days of Exercise per Week: 0 days   Minutes of Exercise per Session: 0 min  Stress: No Stress Concern Present   Feeling of Stress : Only a little  Social Connections: Socially Isolated   Frequency of Communication with Friends and Family: More than three times a week   Frequency of Social Gatherings with Friends and Family: More than three times a week   Attends Religious Services: Never   Marine scientist or Organizations: No   Attends Music therapist: Never   Marital Status: Never married  Human resources officer Violence: Not At Risk   Fear of Current or Ex-Partner: No   Emotionally Abused: No   Physically Abused: No   Sexually Abused: No     Family History  Problem Relation Age of Onset   Heart disease Mother    Pneumonia Father    Diabetes Maternal Aunt    Diabetes Maternal Uncle      Review of Systems: All other systems reviewed and are otherwise negative except as noted above.  Physical Exam: Vitals:   04/28/21 2014 04/29/21 0432 04/29/21 0800 04/29/21 0931  BP:  125/78 129/81   Pulse:  87 88   Resp:  18 16   Temp:  98.3 F (36.8 C) 98 F (36.7 C)   TempSrc:  Oral Oral   SpO2: 98% 98% 100% 100%  Weight:  65.4 kg    Height:        GEN- The patient is well appearing, alert and oriented x 3 today.   HEENT: normocephalic, atraumatic; sclera clear, conjunctiva pink; hearing intact;  oropharynx clear; neck supple Lungs- Clear to ausculation bilaterally, normal work of breathing.  No wheezes, rales, rhonchi Heart- Regular rate and rhythm, no murmurs, rubs or gallops GI- soft, non-tender, non-distended, bowel sounds present Extremities- no clubbing, cyanosis, or edema; DP/PT/radial pulses 2+ bilaterally MS- no significant deformity or atrophy Skin- warm and dry, no rash or lesion Psych- euthymic mood, full affect Neuro- strength and sensation are intact  Labs:   Lab Results  Component Value Date   WBC 14.7 (H) 04/29/2021   HGB 9.0 (L) 04/29/2021   HCT 27.4 (L) 04/29/2021   MCV 86.4 04/29/2021   PLT 617 (H) 04/29/2021    Recent Labs  Lab 04/29/21 0433  NA 136  K 4.2  CL 104  CO2 19*  BUN 43*  CREATININE 2.91*  CALCIUM 6.5*  GLUCOSE 191*      Radiology/Studies: US RENAL  Result Date: 04/20/2021 CLINICAL DATA:  Acute kidney injury EXAM: RENAL / URINARY TRACT ULTRASOUND COMPLETE COMPARISON:  10/26/2017 FINDINGS: Right Kidney: Renal measurements: 11.8 x 5.7 x 6.4 cm = volume: 226.4 mL. Echogenicity within normal limits. No mass or hydronephrosis visualized. Trace right perinephric fluid. Left Kidney: Renal measurements: 11.6 x 6.2 x 6.4 cm = volume: 243.1 mL. Echogenicity within normal limits. No mass or hydronephrosis visualized. Bladder: Appears normal for  degree of bladder distention. Other: None. IMPRESSION: Negative examination. Electronically Signed   By: Donavan Foil M.D.   On: 04/20/2021 15:42   DG CHEST PORT 1 VIEW  Result Date: 04/28/2021 CLINICAL DATA:  Shortness of breath EXAM: PORTABLE CHEST 1 VIEW COMPARISON:  Previous studies including the examination of 04/26/2021 FINDINGS: Transverse diameter of heart is increased. There is partial clearing of increased interstitial and alveolar markings in both lungs suggesting resolving pulmonary edema. There is mild residual prominence of interstitial markings in the parahilar regions and lower lung fields.  There are no new focal infiltrates. There is no significant pleural effusion or pneumothorax. Surgical clips are seen in the thyroid bed. IMPRESSION: There is significant interval decrease in interstitial and alveolar densities in both lungs suggesting resolving pulmonary edema. There is residual prominence of interstitial markings in the parahilar regions and lower lung fields suggesting residual pulmonary edema. No new focal pulmonary consolidation is seen. Electronically Signed   By: Elmer Picker M.D.   On: 04/28/2021 08:13   DG CHEST PORT 1 VIEW  Result Date: 04/26/2021 CLINICAL DATA:  Acute respiratory failure with hypoxia. EXAM: PORTABLE CHEST 1 VIEW COMPARISON:  04/25/2021 FINDINGS: Heart size is accentuated by technique and stable. Patchy infiltrates are again identified throughout the lungs, some interval improvement in aeration. RIGHT pleural effusion is small. IMPRESSION: Slightly improved aeration. Electronically Signed   By: Nolon Nations M.D.   On: 04/26/2021 10:42   DG CHEST PORT 1 VIEW  Result Date: 04/25/2021 CLINICAL DATA:  Acute respiratory failure EXAM: PORTABLE CHEST 1 VIEW COMPARISON:  Chest x-ray dated April 22, 2021 FINDINGS: Cardiac and mediastinal contours are unchanged. Increased right greater than left heterogeneous opacities. No large pleural effusion or evidence of pneumothorax. IMPRESSION: Increased right greater than left heterogeneous opacities, concerning for worsening edema multifocal infection. Electronically Signed   By: Yetta Glassman M.D.   On: 04/25/2021 09:40   DG CHEST PORT 1 VIEW  Result Date: 04/22/2021 CLINICAL DATA:  Shortness of breath EXAM: PORTABLE CHEST 1 VIEW COMPARISON:  Three days ago FINDINGS: Severe airspace disease asymmetric to the right. Borderline heart size. No visible effusion or pneumothorax. IMPRESSION: Extensive and worsened airspace disease. Electronically Signed   By: Jorje Guild M.D.   On: 04/22/2021 05:52   DG  Chest Port 1 View  Result Date: 04/20/2021 CLINICAL DATA:  Shortness of breath EXAM: PORTABLE CHEST 1 VIEW COMPARISON:  01/10/2021, 12/04/2020 FINDINGS: Normal cardiac size. Mild diffuse interstitial thickening with mild ground-glass opacity in the right thorax. No pleural effusion or pneumothorax. Aortic atherosclerosis. Postsurgical changes at the thoracic inlet. IMPRESSION: 1. Mild ground-glass opacity in the right thorax, potentially due to pneumonia. 2. Mild background chronic bronchitic change Electronically Signed   By: Donavan Foil M.D.   On: 04/20/2021 00:05   ECHOCARDIOGRAM LIMITED  Result Date: 04/20/2021    ECHOCARDIOGRAM LIMITED REPORT   Patient Name:   ALEC JAROS Date of Exam: 04/20/2021 Medical Rec #:  [id number]       Height:       68.0 in Accession #:    [id number]      Weight:       130.1 lb Date of Birth:  [birth year]      BSA:          1.702 m Patient Age:    57 years        BP:           154/81 mmHg Patient Gender: M  HR:           87 bpm. Exam Location:  [facility] Procedure: Limited Echo and Intracardiac Opacification Agent Indications:    CHF-Acute Diastolic  History:        Patient has prior history of Echocardiogram examinations, most                 recent 12/27/2020. CHF, Arrythmias:Atrial Fibrillation,                 Signs/Symptoms:Syncope; Risk Factors:Current Smoker,                 Hypertension, Diabetes and Dyslipidemia. Cocaine and Alcohol                 abuse.  Sonographer:    Wenda Low Referring Phys: [id number] OLADAPO ADEFESO  Sonographer Comments: Technically challenging study due to limited acoustic windows. IMPRESSIONS  1. Left ventricular ejection fraction, by estimation, is 45 to 50%. The left ventricle has mildly decreased function. There is moderate left ventricular hypertrophy.  2. Right ventricular systolic function is normal. The right ventricular size is normal.  3. Left atrial size was severely dilated.  4. Right atrial size was moderately  dilated.  5. The inferior vena cava is normal in size with greater than 50% respiratory variability, suggesting right atrial pressure of 3 mmHg.  6. Limited echo evaluate LV function. FINDINGS  Left Ventricle: Left ventricular ejection fraction, by estimation, is 45 to 50%. The left ventricle has mildly decreased function. Definity contrast agent was given IV to delineate the left ventricular endocardial borders. There is moderate left ventricular hypertrophy. Right Ventricle: The right ventricular size is normal. Right vetricular wall thickness was not well visualized. Right ventricular systolic function is normal. Left Atrium: Left atrial size was severely dilated. Right Atrium: Right atrial size was moderately dilated. Pericardium: There is no evidence of pericardial effusion. Venous: The inferior vena cava is normal in size with greater than 50% respiratory variability, suggesting right atrial pressure of 3 mmHg. LEFT VENTRICLE PLAX 2D LVIDd:         4.90 cm LVIDs:         4.10 cm LV PW:         1.30 cm LV IVS:        1.30 cm LVOT diam:     2.00 cm LVOT Area:     3.14 cm  RIGHT VENTRICLE RV Basal diam:  3.60 cm RV Mid diam:    2.90 cm TAPSE (M-mode): 2.3 cm LEFT ATRIUM              Index        RIGHT ATRIUM           Index LA diam:        4.10 cm  2.41 cm/m   RA Area:     20.70 cm LA Vol (A2C):   127.0 ml 74.61 ml/m  RA Volume:   63.10 ml  37.07 ml/m LA Vol (A4C):   65.8 ml  38.66 ml/m LA Biplane Vol: 96.6 ml  56.75 ml/m   SHUNTS Systemic Diam: 2.00 cm Carlyle Dolly MD Electronically signed by Carlyle Dolly MD Signature Date/Time: 04/20/2021/11:20:36 AM    Final     EKG:on admission shows sinus tach at 100 with ?PAC (personally reviewed)  TELEMETRY: shows mostly NSR 80-90 with PVCs and multiple runs of sustained VT. Narrow QRS (personally reviewed)  Assessment/Plan: 1.  Sustained VT K 4.2 this am, Mg 2.2 EF  45-50% by echo this admission Complicated by acute illness Potential ischemic  component. Perhaps as Cr improves could have interventional team due a low contrast cath. cMRI would be helpful to evaluate his substrate for VT, and will not affect his renal function Agree with covering with amiodarone as best option  With acute issues and recent substance abuse (Cocaine + July, no UDS this admit) he is a poor candidate for EP procedures at this time.  Nephrology consultation would also be helpful in assessing likelihood of renal improvement.   2. Paroxysmal atrial fibrillation NSR He is on Eliquis 5 mg BID for stroke prevention.  3. H/o syncope In 12/2020 in setting of polysubstance abuse at that time. K 3.5 that admission  As above, currently poor candidate for EP procedures.   Dr. [name] has seen  For questions or updates, please contact [facility] Please consult [email or web address] for contact info under Cardiology/STEMI.  Jawon, Dipiero, PA-C  04/29/2021 11:20 AM

## 2021-04-29 NOTE — Care Management Important Message (Signed)
Important Message  Patient Details  Name: Eagle Pitta MRN: [id number] Date of Birth: Apr 29, 1956   Medicare Important Message Given:  Yes     [name] 04/29/2021, 8:56 AM

## 2021-04-29 NOTE — Consult Note (Signed)
[facility] Renal Consultation Note  Requesting MD: Hongalgi Indication for Consultation: A on CRF  HPI:  Todd [name] is a 65 y.o. male with past medical history significant for T2 DM, HTN, gout, chronic Afib, hypothyroid, bilateral AKA's, history of polysubstance abuse and noncompliance with medical treatment .  There is a crt in the system from 01/15/21 that was 1.04 but has had some AKI in the past.    He presented to the ER on 04/20/21 with complaint of SOB-  hypoxic- cxr c/w PNA-  started on abx-  also elevated troponin and BNP- so have been diuresing as well.  Starting crt was 1.89, then 1.69-  but then has increased slowly to a level of 2.9 on 11/16-  stayed stable today.  Some soft BPs with SBP as low at 94-  on bidil/amio/toprol -  some atrial and ventricular arrythmias with mildly dec EF-  cardiology concerned about cardiac ischemia but do not want to do cath due to elevated crt-  tryin to medically manage. Is non oliguric.  Urine with 100 of protein and no cells.  Renal ultrasound normal-  11+cm kidneys.  The patient says that he feels fine-  no more SOB-  sat 100 on room air.  Family at bedside as well  Creatinine, Ser  Date/Time Value Ref Range Status  04/29/2021 04:33 AM 2.91 (H) 0.61 - 1.24 mg/dL Final  04/28/2021 07:05 PM 2.99 (H) 0.61 - 1.24 mg/dL Final  04/28/2021 01:34 AM 2.89 (H) 0.61 - 1.24 mg/dL Final  04/27/2021 02:32 AM 2.59 (H) 0.61 - 1.24 mg/dL Final  04/26/2021 01:00 AM 2.19 (H) 0.61 - 1.24 mg/dL Final  04/25/2021 02:00 AM 2.12 (H) 0.61 - 1.24 mg/dL Final  04/24/2021 03:26 AM 2.34 (H) 0.61 - 1.24 mg/dL Final  04/23/2021 03:39 AM 2.42 (H) 0.61 - 1.24 mg/dL Final  04/22/2021 02:07 AM 2.25 (H) 0.61 - 1.24 mg/dL Final  04/20/2021 03:07 PM 1.69 (H) 0.61 - 1.24 mg/dL Final  04/19/2021 12:19 AM 1.89 (H) 0.61 - 1.24 mg/dL Final  01/15/2021 07:50 AM 1.04 0.61 - 1.24 mg/dL Final  01/13/2021 05:48 AM 1.19 0.61 - 1.24 mg/dL Final  01/12/2021 05:09 AM 1.22 0.61 -  1.24 mg/dL Final  01/11/2021 05:09 AM 1.23 0.61 - 1.24 mg/dL Final  01/10/2021 08:47 AM 0.93 0.61 - 1.24 mg/dL Final  12/29/2020 04:12 AM 1.25 (H) 0.61 - 1.24 mg/dL Final  12/28/2020 08:58 AM 1.28 (H) 0.61 - 1.24 mg/dL Final  12/27/2020 05:55 AM 1.22 0.61 - 1.24 mg/dL Final  12/26/2020 03:45 PM 1.27 (H) 0.61 - 1.24 mg/dL Final  12/06/2020 06:00 AM 1.19 0.61 - 1.24 mg/dL Final  12/05/2020 04:12 AM 1.72 (H) 0.61 - 1.24 mg/dL Final  12/04/2020 08:41 PM 2.08 (H) 0.61 - 1.24 mg/dL Final  09/22/2020 09:23 AM 1.16 0.76 - 1.27 mg/dL Final  08/06/2020 08:05 AM 1.01 0.76 - 1.27 mg/dL Final    Comment:                   **Effective August 10, 2020 [facility] will begin**                  reporting the 2021 CKD-EPI creatinine equation that                  estimates kidney function without a race variable.   01/27/2020 08:59 AM 1.27 0.76 - 1.27 mg/dL Final  09/30/2019 08:10 AM 1.27 0.76 - 1.27 mg/dL Final  04/04/2019 08:15 AM 1.29 (H)  0.76 - 1.27 mg/dL Final  03/26/2019 12:00 AM 1.17 0.76 - 1.27 mg/dL Final  11/14/2018 09:48 AM 1.18 0.76 - 1.27 mg/dL Final  07/17/2018 09:30 AM 1.70 (H) 0.76 - 1.27 mg/dL Final  03/09/2018 10:02 AM 1.33 (H) 0.76 - 1.27 mg/dL Final  03/02/2018 11:41 AM 1.12 0.76 - 1.27 mg/dL Final  02/16/2018 04:08 AM 1.17 0.61 - 1.24 mg/dL Final  02/15/2018 11:39 AM 1.21 0.61 - 1.24 mg/dL Final  02/15/2018 07:12 AM 1.23 0.61 - 1.24 mg/dL Final  02/15/2018 04:12 AM 1.25 (H) 0.61 - 1.24 mg/dL Final  02/15/2018 03:35 AM 1.26 (H) 0.61 - 1.24 mg/dL Final  02/14/2018 04:08 AM 1.32 (H) 0.61 - 1.24 mg/dL Final  02/13/2018 08:39 PM 1.39 (H) 0.61 - 1.24 mg/dL Final  02/13/2018 07:11 AM 1.52 (H) 0.61 - 1.24 mg/dL Final  02/13/2018 02:20 AM 1.62 (H) 0.61 - 1.24 mg/dL Final  02/12/2018 11:33 PM 1.77 (H) 0.61 - 1.24 mg/dL Final  02/12/2018 06:41 PM 2.00 (H) 0.61 - 1.24 mg/dL Final  02/09/2018 12:22 PM 1.63 (H) 0.76 - 1.27 mg/dL Final  02/05/2018 08:39 AM 1.40 (H) 0.61 - 1.24 mg/dL Final   02/04/2018 04:57 AM 1.38 (H) 0.61 - 1.24 mg/dL Final  02/03/2018 05:20 PM 1.29 (H) 0.61 - 1.24 mg/dL Final  02/03/2018 01:08 PM 1.85 (H) 0.61 - 1.24 mg/dL Final  02/03/2018 10:24 AM 2.06 (H) 0.61 - 1.24 mg/dL Final  02/02/2018 11:49 AM 2.02 (H) 0.76 - 1.27 mg/dL Final  11/03/2017 06:26 AM 1.25 (H) 0.61 - 1.24 mg/dL Final     PMHx:   Past Medical History:  Diagnosis Date   AKI (acute kidney injury) ([place])    Constipated    Diabetes mellitus without complication ([place])    Diarrhea    Elevated LFTs    Goiter    Gout    Hyperlipidemia    Hypertension    Leukocytosis    Reactive thrombocytosis    Right BKA infection ([place]) 08/2016   Right leg pain    Sepsis due to undetermined organism ([place])    Thyroid disease    Wound infection after surgery 08/2016    Past Surgical History:  Procedure Laterality Date   ABDOMINAL AORTOGRAM N/A 08/11/2016   Procedure: Abdominal Aortogram;  Surgeon: Waynetta Sandy, MD;  Location: [facility];  Service: Cardiovascular;  Laterality: N/A;   ABDOMINAL AORTOGRAM W/LOWER EXTREMITY N/A 08/15/2016   Procedure: Abdominal Aortogram w/Lower Extremity;  Surgeon: Elam Dutch, MD;  Location: [facility];  Service: Cardiovascular;  Laterality: N/A;   AMPUTATION Right 08/17/2016   Procedure: RIGHT BELOW KNEE AMPUTATION;  Surgeon: Elam Dutch, MD;  Location: [facility];  Service: Vascular;  Laterality: Right;   AMPUTATION Right 09/12/2016   Procedure: AMPUTATION ABOVE KNEE;  Surgeon: Newt Minion, MD;  Location: [facility];  Service: Orthopedics;  Laterality: Right;   AMPUTATION Left 08/12/2016   Procedure: LEFT BELOW KNEE AMPUTATION;  Surgeon: Newt Minion, MD;  Location: [facility];  Service: Orthopedics;  Laterality: Left;   AMPUTATION Left 11/01/2017   Procedure: LEFT ABOVE KNEE AMPUTATION;  Surgeon: Newt Minion, MD;  Location: [facility];  Service: Orthopedics;  Laterality: Left;   APPLICATION OF WOUND VAC Right 09/12/2016   Procedure: APPLICATION  OF WOUND VAC ABOVE KNEE;  Surgeon: Newt Minion, MD;  Location: [facility];  Service: Orthopedics;  Laterality: Right;   BIOPSY  01/13/2021   Procedure: BIOPSY;  Surgeon: Rogene Houston, MD;  [facility];  Service: Endoscopy;;  esophageal   COLONOSCOPY WITH PROPOFOL N/A 01/13/2021   Procedure: COLONOSCOPY WITH PROPOFOL;  Surgeon: Rogene Houston, MD;  [facility];  Service: Endoscopy;  Laterality: N/A;   ESOPHAGOGASTRODUODENOSCOPY (EGD) WITH PROPOFOL N/A 01/13/2021   Procedure: ESOPHAGOGASTRODUODENOSCOPY (EGD) WITH PROPOFOL;  Surgeon: Rogene Houston, MD;  [facility];  Service: Endoscopy;  Laterality: N/A;   LOWER EXTREMITY ANGIOGRAPHY Bilateral 08/11/2016   Procedure: Lower Extremity Angiography;  Surgeon: Waynetta Sandy, MD;  Location: [facility];  Service: Cardiovascular;  Laterality: Bilateral;   PERIPHERAL VASCULAR BALLOON ANGIOPLASTY Left 08/11/2016   Procedure: Peripheral Vascular Balloon Angioplasty;  Surgeon: Waynetta Sandy, MD;  Location: [facility];  Service: Cardiovascular;  Laterality: Left;  SFA   POLYPECTOMY  01/13/2021   Procedure: POLYPECTOMY;  Surgeon: Rogene Houston, MD;  [facility];  Service: Endoscopy;;   THYROID SURGERY      Family Hx:  Family History  Problem Relation Age of Onset   Heart disease Mother    Pneumonia Father    Diabetes Maternal Aunt    Diabetes Maternal Uncle     Social History:  reports that he has been smoking cigarettes. He has a 11.25 pack-year smoking history. He has never used smokeless tobacco. He reports current alcohol use of about 2.0 standard drinks per week. He reports that he does not use drugs.  Allergies:  Allergies  Allergen Reactions   Lisinopril Other (See Comments)    Hyperkalemia / Renal failure    Medications: Prior to Admission medications   Medication Sig Start Date End Date Taking? Authorizing Provider  acetaminophen (TYLENOL) 325 MG tablet  Take 2 tablets (650 mg total) by mouth every 6 (six) hours as needed for mild pain (or Fever >/= 101). 01/15/21  Yes Johnson, Clanford L, MD  allopurinol (ZYLOPRIM) 100 MG tablet Take 1 tablet (100 mg total) by mouth daily. Patient taking differently: Take 300 mg by mouth daily. 12/06/20  Yes Johnson, Clanford L, MD  amLODipine (NORVASC) 5 MG tablet Take 1 tablet (5 mg total) by mouth daily. 01/15/21 01/15/22 Yes Johnson, Clanford L, MD  apixaban (ELIQUIS) 5 MG TABS tablet Take 1 tablet (5 mg total) by mouth 2 (two) times daily. 01/16/21  Yes Johnson, Clanford L, MD  atorvastatin (LIPITOR) 40 MG tablet TAKE 1 TABLET BY MOUTH ONCE DAILY AT 6 PM 12/06/20  Yes Johnson, Clanford L, MD  calcium carbonate (TUMS - DOSED IN MG ELEMENTAL CALCIUM) 500 MG chewable tablet Chew 2 tablets (400 mg of elemental calcium total) by mouth 2 (two) times daily with a meal. Patient taking differently: Chew 2 tablets by mouth 3 (three) times daily with meals. 01/15/21  Yes Johnson, Clanford L, MD  cetirizine (ZYRTEC) 10 MG tablet Take 1 tablet (10 mg total) by mouth daily as needed for allergies. For allergy symptoms 12/06/20  Yes Johnson, Clanford L, MD  cholecalciferol (VITAMIN D3) 25 MCG (1000 UNIT) tablet Take 1,000 Units by mouth daily.   Yes [provider]  folic acid (FOLVITE) 1 MG tablet Take 1 tablet (1 mg total) by mouth daily. 12/29/20  Yes Emokpae, Courage, MD  glucose blood (ACCU-CHEK AVIVA PLUS) test strip Check blood sugars four times a day 03/12/18  Yes [name], [name], MD  insulin lispro (HUMALOG) 100 UNIT/ML KwikPen Inject 2-10 Units into the skin. Sliding scale 200-250 2units, 251-300 4units, 301-350 6units, 351-400 8units,401-450 10units 04/08/21  Yes [provider]  levothyroxine (SYNTHROID) 112 MCG tablet Take 1 tablet (112 mcg total) by mouth daily before breakfast. 02/25/21 02/25/22 Yes Reardon, Juanetta Beets, NP  LORazepam (ATIVAN) 0.5 MG tablet Take 1 tablet by mouth every 6 (six) hours as needed.  04/12/21  Yes [provider]  megestrol (MEGACE) 400 MG/10ML suspension Take 10 mLs (400 mg total) by mouth 2 (two) times daily. For appetite stimulation 12/06/20  Yes Johnson, Clanford L, MD  metoprolol succinate (TOPROL XL) 25 MG 24 hr tablet Take 1 tablet (25 mg total) by mouth daily. 12/29/20 12/29/21 Yes Emokpae, Courage, MD  Multiple Vitamin (MULTIVITAMIN WITH MINERALS) TABS tablet Take 1 tablet by mouth daily. 01/16/21  Yes Johnson, Clanford L, MD  Nutritional Supplements (NUTRITIONAL SHAKE HIGH PROTEIN) LIQD Take 30 mLs by mouth daily.   Yes [provider]  pantoprazole (PROTONIX) 40 MG tablet Take 1 tablet (40 mg total) by mouth 2 (two) times daily. 01/15/21  Yes Johnson, Clanford L, MD  polyethylene glycol (MIRALAX / GLYCOLAX) 17 g packet Take 17 g by mouth daily. 01/16/21  Yes Johnson, Clanford L, MD  sertraline (ZOLOFT) 50 MG tablet Take 1 tablet (50 mg total) by mouth at bedtime. 12/06/20  Yes Johnson, Clanford L, MD  sodium bicarbonate 650 MG tablet Take 650 mg by mouth 3 (three) times daily.   Yes [provider]  thiamine 100 MG tablet Take 1 tablet (100 mg total) by mouth daily. 12/29/20  Yes Emokpae, Courage, MD  Nystatin (GERHARDT'S BUTT CREAM) CREA Apply 1 application topically 3 (three) times daily. Patient not taking: No sig reported 01/15/21   Irwin Brakeman L, MD  silver sulfADIAZINE (SILVADENE) 1 % cream Apply 1 application topically daily. Apply to buttock Patient not taking: No sig reported    [provider]    I have reviewed the patient's current medications.  Labs:  Results for orders placed or performed during the hospital encounter of 04/19/21 (from the past 48 hour(s))  Glucose, capillary     Status: None   Collection Time: 04/27/21  4:46 PM  Result Value Ref Range   Glucose-Capillary 76 70 - 99 mg/dL    Comment: Glucose reference range applies only to samples taken after fasting for at least 8 hours.  Glucose, capillary      Status: Abnormal   Collection Time: 04/27/21  9:32 PM  Result Value Ref Range   Glucose-Capillary 163 (H) 70 - 99 mg/dL    Comment: Glucose reference range applies only to samples taken after fasting for at least 8 hours.  Renal function panel     Status: Abnormal   Collection Time: 04/28/21  1:34 AM  Result Value Ref Range   Sodium 136 135 - 145 mmol/L   Potassium 3.5 3.5 - 5.1 mmol/L   Chloride 105 98 - 111 mmol/L   CO2 18 (L) 22 - 32 mmol/L   Glucose, Bld 149 (H) 70 - 99 mg/dL    Comment: Glucose reference range applies only to samples taken after fasting for at least 8 hours.   BUN 42 (H) 8 - 23 mg/dL   Creatinine, Ser 2.89 (H) 0.61 - 1.24 mg/dL   Calcium 6.6 (L) 8.9 - 10.3 mg/dL   Phosphorus 5.2 (H) 2.5 - 4.6 mg/dL   Albumin 1.9 (L) 3.5 - 5.0 g/dL   GFR, Estimated 23 (L) >60 mL/min    Comment: (NOTE) Calculated using the CKD-EPI Creatinine Equation (2021)    Anion gap 13 5 - 15    Comment:  Performed at [facility], [place] [address], [place], [place] [address]  Magnesium     Status: None   Collection Time: 04/28/21  1:34 AM  Result Value Ref Range   Magnesium 2.1 1.7 - 2.4 mg/dL    Comment: Performed at [facility] [address] [place], [place] [address]  CBC     Status: Abnormal   Collection Time: 04/28/21  1:34 AM  Result Value Ref Range   WBC 16.4 (H) 4.0 - 10.5 K/uL   RBC 3.29 (L) 4.22 - 5.81 MIL/uL   Hemoglobin 9.3 (L) 13.0 - 17.0 g/dL   HCT 28.2 (L) 39.0 - 52.0 %   MCV 85.7 80.0 - 100.0 fL   MCH 28.3 26.0 - 34.0 pg   MCHC 33.0 30.0 - 36.0 g/dL   RDW 15.4 11.5 - 15.5 %   Platelets 603 (H) 150 - 400 K/uL   nRBC 0.0 0.0 - 0.2 %    Comment: Performed at [facility], [place] [address] [place], [place] [address]  Glucose, capillary     Status: Abnormal   Collection Time: 04/28/21  7:49 AM  Result Value Ref Range   Glucose-Capillary 231 (H) 70 - 99 mg/dL    Comment: Glucose reference range applies only to samples taken after fasting for at least  8 hours.  Glucose, capillary     Status: Abnormal   Collection Time: 04/28/21 11:24 AM  Result Value Ref Range   Glucose-Capillary 139 (H) 70 - 99 mg/dL    Comment: Glucose reference range applies only to samples taken after fasting for at least 8 hours.  Glucose, capillary     Status: Abnormal   Collection Time: 04/28/21  4:12 PM  Result Value Ref Range   Glucose-Capillary 127 (H) 70 - 99 mg/dL    Comment: Glucose reference range applies only to samples taken after fasting for at least 8 hours.  Basic metabolic panel     Status: Abnormal   Collection Time: 04/28/21  7:05 PM  Result Value Ref Range   Sodium 137 135 - 145 mmol/L   Potassium 3.7 3.5 - 5.1 mmol/L   Chloride 105 98 - 111 mmol/L   CO2 19 (L) 22 - 32 mmol/L   Glucose, Bld 166 (H) 70 - 99 mg/dL    Comment: Glucose reference range applies only to samples taken after fasting for at least 8 hours.   BUN 44 (H) 8 - 23 mg/dL   Creatinine, Ser 2.99 (H) 0.61 - 1.24 mg/dL   Calcium 6.3 (LL) 8.9 - 10.3 mg/dL    Comment: CRITICAL RESULT CALLED TO, READ BACK BY AND VERIFIED WITH: L.MITCHELL,RN @2004  04/28/2021 [email or web address]    GFR, Estimated 22 (L) >60 mL/min    Comment: (NOTE) Calculated using the CKD-EPI Creatinine Equation (2021)    Anion gap 13 5 - 15    Comment: Performed at [facility], [place] [address] [place], [place] [address]  Magnesium     Status: None   Collection Time: 04/28/21  7:05 PM  Result Value Ref Range   Magnesium 1.8 1.7 - 2.4 mg/dL    Comment: Performed at [facility], [place] [address], [place], [place] [address]  Glucose, capillary     Status: Abnormal   Collection Time: 04/28/21  9:51 PM  Result Value Ref Range   Glucose-Capillary 212 (H) 70 - 99 mg/dL    Comment: Glucose reference range applies only to samples taken after  fasting for at least 8 hours.  Basic metabolic panel     Status: Abnormal   Collection Time: 04/29/21  4:33 AM  Result Value Ref Range   Sodium 136 135 - 145 mmol/L    Potassium 4.2 3.5 - 5.1 mmol/L   Chloride 104 98 - 111 mmol/L   CO2 19 (L) 22 - 32 mmol/L   Glucose, Bld 191 (H) 70 - 99 mg/dL    Comment: Glucose reference range applies only to samples taken after fasting for at least 8 hours.   BUN 43 (H) 8 - 23 mg/dL   Creatinine, Ser 2.91 (H) 0.61 - 1.24 mg/dL   Calcium 6.5 (L) 8.9 - 10.3 mg/dL   GFR, Estimated 23 (L) >60 mL/min    Comment: (NOTE) Calculated using the CKD-EPI Creatinine Equation (2021)    Anion gap 13 5 - 15    Comment: Performed at [facility] [address] [place], [place] [address]  CBC     Status: Abnormal   Collection Time: 04/29/21  4:33 AM  Result Value Ref Range   WBC 14.7 (H) 4.0 - 10.5 K/uL   RBC 3.17 (L) 4.22 - 5.81 MIL/uL   Hemoglobin 9.0 (L) 13.0 - 17.0 g/dL   HCT 27.4 (L) 39.0 - 52.0 %   MCV 86.4 80.0 - 100.0 fL   MCH 28.4 26.0 - 34.0 pg   MCHC 32.8 30.0 - 36.0 g/dL   RDW 15.4 11.5 - 15.5 %   Platelets 617 (H) 150 - 400 K/uL   nRBC 0.0 0.0 - 0.2 %    Comment: Performed at [facility], [place] [address] [place], [place] [address]  Magnesium     Status: None   Collection Time: 04/29/21  4:33 AM  Result Value Ref Range   Magnesium 2.2 1.7 - 2.4 mg/dL    Comment: Performed at [facility] [address] [place], [place] [address]  Glucose, capillary     Status: Abnormal   Collection Time: 04/29/21  7:58 AM  Result Value Ref Range   Glucose-Capillary 136 (H) 70 - 99 mg/dL    Comment: Glucose reference range applies only to samples taken after fasting for at least 8 hours.  Glucose, capillary     Status: Abnormal   Collection Time: 04/29/21 11:35 AM  Result Value Ref Range   Glucose-Capillary 211 (H) 70 - 99 mg/dL    Comment: Glucose reference range applies only to samples taken after fasting for at least 8 hours.     ROS:  A comprehensive review of systems was negative.  Physical Exam: Vitals:   04/29/21 0800 04/29/21 0931  BP: 129/81   Pulse: 88   Resp: 16   Temp: 98 F  (36.7 C)   SpO2: 100% 100%     General: soft spoken-  thin except distended belly - NAD HEENT: PERRLA, EOMI, mucous membranes moist Neck: no JVD Heart: slightly tachy Lungs: mostly clear Abdomen: slightly distended-  no fluid wave Extremities: no edema-  bilat AKA's Skin: warm and dry Neuro: alert, non focal   Assessment/Plan: 65 year old BM with DM, HTN, vasc dz , current smoker with AKI in the past-  presents with PNA and cardiac arrythmias-  has developed AK and A on CRF during hospitalization highlighted by soft BPs and cardiac arrythmias 1.Renal- AKI and Aon CRF since 11/8.  U/A is fairly bland,ultrasound normal so I suspect a hemodynamic reason for this A on CRF-  concern about hypotension and arrythmias not giving good renal perfusion.  He is non oliguric and renal function does look like it is trying to plateau.  In an effort to improve renal perfusion-  will stop the hydralazine portion of his bidil.  Also -  he does not appear volume overloaded so will stop lasix.  Of course, if there is an emergent indication for heart cath it would need to be done and we would just manage the aftermath as best we can.  However, I hope we will soon see some improvement back toward baseline with changes made above.  He would be a candidate for dialysis if needed but not needed right now 2. Hypertension/volume  - BP if anything is low-  stop hydralazine and lasix as he is not volume overloaded at this time  3. Anemia  - hgb 9  - will check iron stores and consider ESA 4. [place]-  WNL-  corrected calcium is over 8 5. Metabolic acidosis-  on po bicarb    Lorilynn Lehr A Aveena Bari 04/29/2021, 1:42 PM

## 2021-04-30 DIAGNOSIS — N179 Acute kidney failure, unspecified: Secondary | ICD-10-CM | POA: Diagnosis not present

## 2021-04-30 DIAGNOSIS — J189 Pneumonia, unspecified organism: Secondary | ICD-10-CM | POA: Diagnosis not present

## 2021-04-30 LAB — BASIC METABOLIC PANEL
Anion gap: 11 (ref 5–15)
BUN: 40 mg/dL — ABNORMAL HIGH (ref 8–23)
CO2: 18 mmol/L — ABNORMAL LOW (ref 22–32)
Calcium: 6.4 mg/dL — CL (ref 8.9–10.3)
Chloride: 107 mmol/L (ref 98–111)
Creatinine, Ser: 2.84 mg/dL — ABNORMAL HIGH (ref 0.61–1.24)
GFR, Estimated: 24 mL/min — ABNORMAL LOW (ref >60)
Glucose, Bld: 258 mg/dL — ABNORMAL HIGH (ref 70–99)
Potassium: 4.3 mmol/L (ref 3.5–5.1)
Sodium: 136 mmol/L (ref 135–145)

## 2021-04-30 LAB — VITAMIN D 25 HYDROXY (VIT D DEFICIENCY, FRACTURES): Vit D, 25-Hydroxy: 29.7 ng/mL — ABNORMAL LOW (ref 30–100)

## 2021-04-30 LAB — GLUCOSE, CAPILLARY
Glucose-Capillary: 212 mg/dL — ABNORMAL HIGH (ref 70–99)
Glucose-Capillary: 195 mg/dL — ABNORMAL HIGH (ref 70–99)
Glucose-Capillary: 202 mg/dL — ABNORMAL HIGH (ref 70–99)
Glucose-Capillary: 180 mg/dL — ABNORMAL HIGH (ref 70–99)

## 2021-04-30 LAB — MAGNESIUM: Magnesium: 2 mg/dL (ref 1.7–2.4)

## 2021-04-30 LAB — CALCIUM, IONIZED: Calcium, Ionized, Serum: 4 mg/dL — ABNORMAL LOW (ref 4.5–5.6)

## 2021-04-30 MED ORDER — CALCIUM GLUCONATE-NACL 1-0.675 GM/50ML-% IV SOLN
1.0000 g | Freq: Once | INTRAVENOUS | Status: AC
  Administered 2021-04-30: 1000 mg via INTRAVENOUS
  Filled 2021-04-30: qty 50

## 2021-04-30 NOTE — TOC Progression Note (Signed)
Transition of Care [facility]) - Progression Note    Patient Details  Name: Todd [name] MRN: [id number] Date of Birth: [birth year]  Transition of Care [facility]) CM/SW [place], [place] Phone Number: 04/30/2021, 9:56 AM  Clinical Narrative:      CSW continues to follow patient until medically ready for dc. Patient has SNF bed at [facility]. CSW will continue to follow and assist with patients dc planning needs.   Barriers to Discharge: Continued Medical Work up  Expected Discharge Plan and Services           Expected Discharge Date: 04/25/21                                     Social Determinants of Health (SDOH) Interventions    Readmission Risk Interventions No flowsheet data found.

## 2021-04-30 NOTE — Progress Notes (Addendum)
Patient ID: Kasheem Toner, male   DOB: 1955-08-08, 65 y.o.   MRN: [id number] S: No complaints and no events overnight O:BP 133/82 (BP Location: Left Arm)   Pulse 82   Temp 98.4 F (36.9 C) (Oral)   Resp 16   Ht 5\' 8"  (1.727 m)   Wt 64.8 kg   SpO2 97%   BMI 21.72 kg/m   Intake/Output Summary (Last 24 hours) at 04/30/2021 1149 Last data filed at 04/30/2021 0610 Gross per 24 hour  Intake 573.54 ml  Output 1000 ml  Net -426.46 ml   Intake/Output: I/O last 3 completed shifts: In: 1351.6 [P.O.:960; I.V.:143.5; IV Piggyback:248] Out: 5784 [ONGEX:[id number]]  Intake/Output this shift:  No intake/output data recorded. Weight change: -0.609 kg Gen:NAD CVS: RRR Resp:CTA Abd: +BS, soft,NT/Nd Ext: s/p bilateral BKA's, no edema  Recent Labs  Lab 04/24/21 0326 04/25/21 0200 04/26/21 0100 04/27/21 0232 04/28/21 0134 04/28/21 1905 04/29/21 0433 04/30/21 0157  NA 140 139 140 138 136 137 136 136  K 3.5 3.5 3.4* 3.4* 3.5 3.7 4.2 4.3  CL 115* 114* 112* 109 105 105 104 107  CO2 15* 15* 16* 16* 18* 19* 19* 18*  GLUCOSE 189* 62* 104* 143* 149* 166* 191* 258*  BUN 42* 33* 31* 35* 42* 44* 43* 40*  CREATININE 2.34* 2.12* 2.19* 2.59* 2.89* 2.99* 2.91* 2.84*  ALBUMIN 1.9* 1.8* 1.8* 1.8* 1.9*  --   --   --   CALCIUM 6.9* 6.6* 6.9* 6.7* 6.6* 6.3* 6.5* 6.4*  PHOS 4.3 4.8* 6.3* 6.2* 5.2*  --   --   --    Liver Function Tests: Recent Labs  Lab 04/26/21 0100 04/27/21 0232 04/28/21 0134  ALBUMIN 1.8* 1.8* 1.9*   No results for input(s): LIPASE, AMYLASE in the last 168 hours. No results for input(s): AMMONIA in the last 168 hours. CBC: Recent Labs  Lab 04/27/21 0232 04/28/21 0134 04/29/21 0433  WBC 15.8* 16.4* 14.7*  NEUTROABS 12.8*  --   --   HGB 9.0* 9.3* 9.0*  HCT 28.1* 28.2* 27.4*  MCV 87.0 85.7 86.4  PLT 571* 603* 617*   Cardiac Enzymes: No results for input(s): CKTOTAL, CKMB, CKMBINDEX, TROPONINI in the last 168 hours. CBG: Recent Labs  Lab 04/29/21 1135 04/29/21 1646  04/29/21 2135 04/30/21 0731 04/30/21 1123  GLUCAP 211* 136* 169* 212* 195*    Iron Studies: No results for input(s): IRON, TIBC, TRANSFERRIN, FERRITIN in the last 72 hours. Studies/Results: No results found.  amiodarone  400 mg Oral BID   apixaban  5 mg Oral BID   atorvastatin  40 mg Oral Daily   folic acid  1 mg Oral Daily   insulin aspart  0-5 Units Subcutaneous QHS   insulin aspart  0-9 Units Subcutaneous TID WC   insulin aspart  3 Units Subcutaneous TID WC   ipratropium-albuterol  3 mL Nebulization BID   isosorbide dinitrate  20 mg Oral BID   levothyroxine  100 mcg Oral QAC breakfast   metoprolol succinate  100 mg Oral Daily   multivitamin with minerals  1 tablet Oral Daily   pantoprazole  40 mg Oral BID   sodium bicarbonate  1,300 mg Oral TID   thiamine  100 mg Oral Daily    BMET    Component Value Date/Time   NA 136 04/30/2021 0157   NA 136 09/22/2020 0923   K 4.3 04/30/2021 0157   CL 107 04/30/2021 0157   CO2 18 (L) 04/30/2021 0157   GLUCOSE 258 (H)  04/30/2021 0157   BUN 40 (H) 04/30/2021 0157   BUN 13 09/22/2020 0923   CREATININE 2.84 (H) 04/30/2021 0157   CALCIUM 6.4 (LL) 04/30/2021 0157   GFRNONAA 24 (L) 04/30/2021 0157   GFRAA 90 08/06/2020 0805   CBC    Component Value Date/Time   WBC 14.7 (H) 04/29/2021 0433   RBC 3.17 (L) 04/29/2021 0433   HGB 9.0 (L) 04/29/2021 0433   HGB 13.5 01/27/2020 0859   HCT 27.4 (L) 04/29/2021 0433   HCT 39.7 01/27/2020 0859   PLT 617 (H) 04/29/2021 0433   PLT 342 01/27/2020 0859   MCV 86.4 04/29/2021 0433   MCV 83 01/27/2020 0859   MCH 28.4 04/29/2021 0433   MCHC 32.8 04/29/2021 0433   RDW 15.4 04/29/2021 0433   RDW 14.2 01/27/2020 0859   LYMPHSABS 1.6 04/27/2021 0232   LYMPHSABS 2.3 01/27/2020 0859   MONOABS 1.1 (H) 04/27/2021 0232   EOSABS 0.1 04/27/2021 0232   EOSABS 0.2 01/27/2020 0859   BASOSABS 0.1 04/27/2021 0232   BASOSABS 0.1 01/27/2020 0859     Assessment/Plan:  AKI/CKD stage IIIa, non-oliguric  - presumably hemodynamically mediated with hypotension, arrhythmias, and IV diuresis.  BUN/Cr have reached a plateau and now appear to be slowly improving.  No uremic symptoms or indication for dialysis.  Hydralazine stopped due to hypotension.  Lasix held as well.  Renal US without obstruction.  He has had multiple episodes of AKI/CKD over the past few years and likely related to cardiorenal syndrome in the past.  Continue to hold lasix and follow renal function. Acute hypoxic respiratory failure - due to combination of CHF and PNA.  Improving since admission. NSVT - EP evaluating.  Acute on chronic diastolic CHF - resolved. NAGMA - continue with sodium bicarbonate tabs. Hypocalcemia - replace with IV calcium and will check vit D levels and iPTH.  Likely worsened with bicarb.    Donetta Potts, MD [employer] [phone]

## 2021-04-30 NOTE — Progress Notes (Signed)
Progress Note  Patient Name: Todd [name] Date of Encounter: 04/30/2021  Primary Cardiologist: Minus Breeding, MD   Subjective   Feeling better today. No CP, no symptoms with sustained episodes of VT  Inpatient Medications    Scheduled Meds:  amiodarone  400 mg Oral BID   apixaban  5 mg Oral BID   atorvastatin  40 mg Oral Daily   folic acid  1 mg Oral Daily   insulin aspart  0-5 Units Subcutaneous QHS   insulin aspart  0-9 Units Subcutaneous TID WC   insulin aspart  3 Units Subcutaneous TID WC   ipratropium-albuterol  3 mL Nebulization BID   isosorbide dinitrate  20 mg Oral BID   levothyroxine  100 mcg Oral QAC breakfast   metoprolol succinate  100 mg Oral Daily   multivitamin with minerals  1 tablet Oral Daily   pantoprazole  40 mg Oral BID   sodium bicarbonate  1,300 mg Oral TID   thiamine  100 mg Oral Daily   Continuous Infusions:  sodium chloride Stopped (04/20/21 0916)   calcium gluconate     PRN Meds: sodium chloride, acetaminophen, ipratropium-albuterol, nicotine   Vital Signs    Vitals:   04/29/21 2007 04/30/21 0424 04/30/21 0807 04/30/21 0833  BP:  124/80 133/82   Pulse:  86 82   Resp:  16 16   Temp:  98.4 F (36.9 C) 98 F (36.7 C)   TempSrc:  Oral Oral   SpO2: 96% 95% 94% 97%  Weight:  64.8 kg    Height:        Intake/Output Summary (Last 24 hours) at 04/30/2021 1342 Last data filed at 04/30/2021 0610 Gross per 24 hour  Intake 573.54 ml  Output 1000 ml  Net -426.46 ml   Filed Weights   04/28/21 0514 04/29/21 0432 04/30/21 0424  Weight: 66 kg 65.4 kg 64.8 kg    Telemetry    NSVT today, improved from yesterday - Personally Reviewed  ECG    SR, PAC, prolonged Qtc 525 msec- Personally Reviewed  Physical Exam   GEN: No acute distress.   Neck: No JVD Cardiac: RRR, no murmurs, rubs, or gallops.  Respiratory: Clear to auscultation bilaterally. GI: Soft, nontender, non-distended  MS: BL AKA Neuro:  Nonfocal  Psych: Normal  affect   Labs    Chemistry Recent Labs  Lab 04/26/21 0100 04/27/21 0232 04/28/21 0134 04/28/21 1905 04/29/21 0433 04/30/21 0157  NA 140 138 136 137 136 136  K 3.4* 3.4* 3.5 3.7 4.2 4.3  CL 112* 109 105 105 104 107  CO2 16* 16* 18* 19* 19* 18*  GLUCOSE 104* 143* 149* 166* 191* 258*  BUN 31* 35* 42* 44* 43* 40*  CREATININE 2.19* 2.59* 2.89* 2.99* 2.91* 2.84*  CALCIUM 6.9* 6.7* 6.6* 6.3* 6.5* 6.4*  ALBUMIN 1.8* 1.8* 1.9*  --   --   --   GFRNONAA 33* 26* 23* 22* 23* 24*  ANIONGAP 12 13 13 13 13 11      Hematology Recent Labs  Lab 04/27/21 0232 04/28/21 0134 04/29/21 0433  WBC 15.8* 16.4* 14.7*  RBC 3.23* 3.29* 3.17*  HGB 9.0* 9.3* 9.0*  HCT 28.1* 28.2* 27.4*  MCV 87.0 85.7 86.4  MCH 27.9 28.3 28.4  MCHC 32.0 33.0 32.8  RDW 15.7* 15.4 15.4  PLT 571* 603* 617*    Cardiac EnzymesNo results for input(s): TROPONINI in the last 168 hours. No results for input(s): TROPIPOC in the last 168 hours.   BNPNo  results for input(s): BNP, PROBNP in the last 168 hours.   DDimer No results for input(s): DDIMER in the last 168 hours.   Radiology    No results found.  Cardiac Studies  Echo 04/20/21  1. Left ventricular ejection fraction, by estimation, is 45 to 50%. The  left ventricle has mildly decreased function. There is moderate left  ventricular hypertrophy.   2. Right ventricular systolic function is normal. The right ventricular  size is normal.   3. Left atrial size was severely dilated.   4. Right atrial size was moderately dilated.   5. The inferior vena cava is normal in size with greater than 50%  respiratory variability, suggesting right atrial pressure of 3 mmHg.   6. Limited echo evaluate LV function.   Patient Profile     65 y.o. male presumed CAD, mild cardiomyopathy with normalization of EF on Echo in 12/2020, paroxysmal atrial fibrillation on Eliquis, PAD s/p bilateral AKA in 2018, hypertension, hyperlipidemia, type 2 diabetes mellitus, hypothyroidism,  CKD stage III, polysubstance abuse (cocaine, alcohol, tobacco), and poor compliance who was admitted on 04/19/2021 with acute hypoxic respiratory failure secondary to community acquired pneumonia and CHF.   Assessment & Plan   Principal Problem:   CAP (community acquired pneumonia) Active Problems:   Essential hypertension   Acquired hypothyroidism   Elevated troponin   Acute respiratory failure with hypoxia (HCC)   Hyperglycemia due to diabetes mellitus (HCC)   Leukocytosis   Reactive thrombocytosis   S/P AKA (above knee amputation) bilateral (HCC)   AKI (acute kidney injury) ([place])   Atrial fibrillation, chronic (HCC)   Hypomagnesemia   Elevated brain natriuretic peptide (BNP) level   Polysubstance abuse (HCC)   Noncompliance with medication regimen   Chronic diastolic CHF (congestive heart failure) (HCC)   Hypoalbuminemia due to protein-calorie malnutrition (HCC)   GERD (gastroesophageal reflux disease)   Acute on Chronic CHF with Mildly Reduced EF Sustained ventricular tachycardia - VT better today with amiodarone on board. Qtc prolonged on ECG, will recheck ECG tomorrow.  - CT/MRI/CATH all relatively contraindicated in setting of CKD. Could consider nuclear study for risk stratification however with severe PAD and reduced EF, most likely etiology for HF is ischemic. Will await improvement in renal function. Ideally, would have recovery of renal function, undergo MRI and cath.  - 400 mg po BID to load for VT. BB titration somewhat challenging with borderline low blood pressure. - Continue Toprol-XL 100mg  daily. - hydralazine stopped by neph for hypotension and contribution to AKI due to hypoperfusion.  -lasix held by neph. Received bolus doses earlier this week due to respiratory decompensation requiring bipap.  - No ACEi/ARB given renal function. - Continue daily weights, strict I/Os, and renal function. - Patient felt to likely have ischemic cardiomyopathy but felt to be high  risk for cardiac catheterization due to renal function so medical management has been recommended. Reviewed with EP and have not excluded nonischemic causes of cardiomyopathy and VT. Will work up as renal function improves.    Elevated Troponin Presumed CAD - High-sensitivity troponin minimally elevated and flat at 52 >> 55 >> 55 >> 56. Not consistent with ACS. - EF borderline at 45%, never had a cath before. - No chest pain. - No Aspirin given need for DOAC. - Continue beta-blocker and high-intensity statin.   Paroxysmal Atrial Fibrillation - Maintaining sinus rhythm now having sustained VT. - Potassium 4.2 today.  - Contineu Toprol-XL 100mg  daily. - Continue Eliquis 5mg  twice daily.  PAD  - S/p bilateral above knee amputations in 2018. - No Aspiring given need for DOAC. - Continue statin.   Hypertension - BP well controlled.  - Continue medications for CHF.   Hyperlipidemia - Continue Lipitor 40mg  daily.   Type 2 Diabetes Mellitus - Hemoglobin A1c 7.9. - Management per primary team.   Acute on CKD Stage III - Creatinine 1.89 on admission - neph now on board to assist with timing of CMR and cath.    Otherwise, per primary team: - Community acquired pneumonia: complete antibiotic therapy - COPD exacerbation - Polysubstance abuse - Hypothyroidism - Anemia of chronic disease - GERD - Malnutrition       For questions or updates, please contact [facility] Please consult [email or web address] for contact info under        Signed, Elouise Munroe, MD  04/30/2021, 1:42 PM

## 2021-04-30 NOTE — Progress Notes (Addendum)
HOSPITAL MEDICINE OVERNIGHT EVENT NOTE    Notified by nursing that patient had a 21 beat run of ventricular tachycardia.  Per nursing patient had no associated symptoms with the event.  Patient is hemodynamically stable.  Chart reviewed, patient has had multiple bouts of sustained ventricular tachycardia since hospitalization.  Patient is currently being followed by cardiology and is currently on scheduled amiodarone therapy.  Will follow-up on repeat chemistry including magnesium later this morning and replace any necessary electrolytes.  Otherwise, we will continue amiodarone and continue to monitor on telemetry.  Todd Emerald  MD [facility]   ADDENDUM 1:35am)  Additional 17 beat run of asymptomatic ventricular tachycardia observed.  Sherryll Burger Chrissie Dacquisto

## 2021-04-30 NOTE — Progress Notes (Signed)
Nutrition Follow-up  DOCUMENTATION CODES:   Not applicable  INTERVENTION:   Continue snacks TID between meals. Continue MVI with minerals daily.  NUTRITION DIAGNOSIS:   Increased nutrient needs related to chronic illness (CHF) as evidenced by estimated needs.  Ongoing  GOAL:   Patient will meet greater than or equal to 90% of their needs  Met with intake of meals and snacks  MONITOR:   PO intake, Labs  REASON FOR ASSESSMENT:   Consult Assessment of nutrition requirement/status  ASSESSMENT:   65 y.o. male presented to the ED from a SNF with SOB. PMH includes HTN, CHF, T2DM, bilateral AKA, and EtOH abuse. Pt admitted with community acquired pneumonia.  Patient remains on a heart healthy carb modified diet. Meal intakes: 50-100%  Patient states he is eating good. Receiving snacks between meals.   Labs reviewed.  CBG: 212 this AM  Medications reviewed and include folic acid, novolog, MVI with minerals, protonix, thiamine.  Admission weight 68.2 kg Current weight 64.8 kg  Diet Order:   Diet Order             Diet heart healthy/carb modified Room service appropriate? Yes; Fluid consistency: Thin  Diet effective now                   EDUCATION NEEDS:   No education needs have been identified at this time  Skin:  Skin Assessment: Reviewed RN Assessment  Last BM:  11/17 type 4  Height:   Ht Readings from Last 1 Encounters:  04/19/21 _0  (1.727 m)    Weight:   Wt Readings from Last 1 Encounters:  04/30/21 64.8 kg   BMI: 23.1 (adjusted for bilateral AKA)  Estimated Nutritional Needs:   Kcal:  1800-2000  Protein:  90-100 gm  Fluid:  1.8-2 L    Lucas Mallow, RD, LDN, CNSC Please refer to Amion for contact information.

## 2021-04-30 NOTE — Progress Notes (Signed)
  Reviewed with Dr. [name]  Continue amiodarone. Recommend cMRI  F/u scheduled.   [name] [address]" [place], PA-C  04/30/2021 8:19 AM

## 2021-04-30 NOTE — Progress Notes (Signed)
PROGRESS NOTE  Todd [name]  KPT:[id number] DOB: [birth year] DOA: 04/19/2021 PCP: [name], FNP  Brief Narrative: Todd [name] is a 65 y.o. male recently admitted from 01/10/2021 to 01/15/2021 due to chronic A. fib with RVR, with medical history significant for essential hypertension, hypothyroidism, type 2 diabetes mellitus, chronic atrial fibrillation, status post bilateral AKA, history of polysubstance abuse (alcohol, tobacco and cocaine) who presents to [facility] ED from [facility] via EMS due to shortness of breath.  He was noted to be hypoxic, in the 80% on room air at the SNF, neb treatment was provided and patient was placed on a supplemental oxygen via [place] at 2 LPM with minimal improvement.  EMS was activated and on arrival, O2 supplementation was increased to 4 LPM en route to the hospital.   Work-up revealed acute hypoxic respiratory failure, right-sided community-acquired pneumonia, mild pulmonary edema, elevated troponin, elevated BNP greater than 800, acute on chronic diastolic CHF, AKI, non-anion gap metabolic acidosis, and electrolyte abnormalities.  Echocardiogram revealed LV systolic dysfunction and monitoring detected RVR. Lasix was given with subsequently increasing creatinine. Diuresis pushed despite renal impairment due to progressive pulmonary edema on CXR with hypoxia.   Assessment & Plan: Principal Problem:   CAP (community acquired pneumonia) Active Problems:   Essential hypertension   Acquired hypothyroidism   Elevated troponin   Acute respiratory failure with hypoxia (HCC)   Hyperglycemia due to diabetes mellitus (HCC)   Leukocytosis   Reactive thrombocytosis   S/P AKA (above knee amputation) bilateral (HCC)   AKI (acute kidney injury) ([place])   Atrial fibrillation, chronic (HCC)   Hypomagnesemia   Elevated brain natriuretic peptide (BNP) level   Polysubstance abuse ([place])   Noncompliance with medication regimen   Hypoalbuminemia due to  protein-calorie malnutrition (HCC)   GERD (gastroesophageal reflux disease)  Acute hypoxic respiratory failure:  - Presumed to be due to CHF, right-sided pneumonia and possible COPD exacerbation. -Hypoxia resolved.  Sustained ventricular tachycardia - Noted on 11/16 evening.  52 beats initially followed by approximately 150 beats per RN.  Asymptomatic. - Cardiology concerned regarding ischemia as etiology.  No prior ischemic evaluation.  CT/MRI/cath all relatively contraindicated in setting of CKD and could certainly push him into ESRD. - Initiated amiodarone IV 150 mg bolus followed by 400 Mg p.o. twice daily. - VT better but still had 21 beats NSVT. - Awaiting improvement in renal function to proceed with further ischemic evaluation i.e. MRI and cath.  PAF, multifocal atrial tachycardia:  - Recent admit for RVR.  - Continue metoprolol succinate, increased to 100mg  dose due to persistent MAT. - Now also on amiodarone which was started for sustained VT. - Maintain K >4, Mg >2.  Currently at goal.  Acute on chronic combined diastolic and systolic CHF, HTN:  - LVEF 45-50% without RWMA, +LVH, normal IVC. Severe LAE, moderate RAE. BNP elevated.  - Converted norvasc to bidil. Continue metoprolol succinate.  - As per discussion with cardiology, patient had diastolic dysfunction on echo in July, systolic dysfunction now and the diastolic dysfunction did not just resolve though not commented on most recent echo when she thereby indicates that patient has combined CHF. - Nephrology discontinued hydralazine to provide BP head for AKI. - Clinically euvolemic.  AKI complicating CKD stage IIIa, nonoliguric, with NAGMA:  -Nephrology consultation and follow-up appreciated. - Suspecting hemodynamically mediated with cardiorenal syndrome, hypotension, arrhythmias and aggressive IV diuresis. - Renal ultrasound without obstruction.  Urine microscopy bland sediment with definite proteinuria 1.03. FENa not  consistent with prerenal azotemia at 1.78%. - History of multiple episodes of AKI over the past few years and likely related to cardiorenal syndrome in the past. - Continue bicarbonate tabs - Creatinine had steadily increased and peaked at 2.99.  Lasix was discontinued. - Creatinine seems to have plateaued and slowly improving. - Avoid nephrotoxins and monitor daily BMP.  Hypocalcemia - Nephrology replacing IV calcium and checking vitamin D levels and intact PTH.  Demand myocardial ischemia:  - Mild troponin elevation with reassuring delta no anginal complaints.  - Defer w/u to cardiology  Right-sided CAP:  -Completed completed course of antibiotics  - Recommend checking chest x-ray in 4 weeks to ensure resolution of abnormal findings.  COPD with exacerbation:  -Exacerbation resolved.  Uncontrolled T2DM with steroid-induced hyperglycemia:  - HbA1c 7.9%.  - Now that steroid effect waning, hypoglycemic so stopped basal insulin. Continue TIDWC + SSI novolog.  Polysubstance abuse:  - Previously +alcohol, tobacco, cocaine.  -Cessation counseled.  Hypothyroidism:  - Repeat TSH is 0.315, last was 20.52 in September 2022 which was improved from 189 in July in setting of medication nonadherence. Free T4 is 1.12 which is the ULN.  - In setting of AFib/MAT, we did decrease synthroid dose 125mcg > 142mcg.   Anemia of chronic disease:  - Having a decline in hgb from admission, though all cell lines are down and hgb was above baseline on admission.  - Hemoglobin stable in the 9 g range.  HTN: Tx as above  PVD s/p bilateral AKA (2018-2019):  - Continue statin, anticoagulation  GERD:  - Continue PPI  Suspected malnutrition: Albumin is 1.8. - Dietitian consulted.   DVT prophylaxis: Eliquis Code Status: Full Family Communication: None at bedside Disposition Plan:  Status is: Inpatient  Remains inpatient appropriate because: requires intensive evaluation and management of acute  kidney injury, sustained NSVT.  Consultants:  Cardiology Nephrology  Procedures:  Echocardiogram  Antimicrobials: Ceftriaxone, azithromycin > vancomycin, zosyn.  Subjective: Seen along with nephrology.  Occasional dyspnea.  Denies any other complaints.  Objective: Vitals:   04/30/21 0424 04/30/21 0807 04/30/21 0833 04/30/21 1440  BP: 124/80 133/82  127/71  Pulse: 86 82  79  Resp: 16 16  17   Temp: 98.4 F (36.9 C) 98 F (36.7 C)  98.8 F (37.1 C)  TempSrc: Oral Oral  Oral  SpO2: 95% 94% 97% 95%  Weight: 64.8 kg     Height:        Intake/Output Summary (Last 24 hours) at 04/30/2021 1608 Last data filed at 04/30/2021 0610 Gross per 24 hour  Intake 573.54 ml  Output 1000 ml  Net -426.46 ml   Filed Weights   04/28/21 0514 04/29/21 0432 04/30/21 0424  Weight: 66 kg 65.4 kg 64.8 kg   Gen: Chronically ill-appearing male in no distress.  Lying comfortably propped up in bed Pulm: Clear to auscultation.  No increased work of breathing. CV: S1 and S2 heard, RRR.  No JVD.  No dependent edema.  S/p bilateral AKA.  Telemetry personally reviewed: Sinus rhythm.  21 beat NSVT. GI: Abdomen nondistended, soft and nontender.  Normal bowel sounds heard. Ext: Warm, dry BL AKA's.  Left stump is healed.  Right stump has a dressing which is clean dry and intact. Skin: No new rashes, lesions or ulcers on visualized skin. Neuro: Alert and oriented. No focal neurological deficits. Psych: Judgement and insight appear fair. Mood euthymic & affect congruent. Behavior is appropriate.    Data Reviewed: I have personally reviewed following  labs and imaging studies  CBC: Recent Labs  Lab 04/24/21 0701 04/27/21 0232 04/28/21 0134 04/29/21 0433  WBC  --  15.8* 16.4* 14.7*  NEUTROABS  --  12.8*  --   --   HGB 9.9* 9.0* 9.3* 9.0*  HCT 30.3* 28.1* 28.2* 27.4*  MCV  --  87.0 85.7 86.4  PLT  --  571* 603* 633*   Basic Metabolic Panel: Recent Labs  Lab 04/24/21 0326 04/25/21 0200  04/26/21 0100 04/26/21 0920 04/27/21 0232 04/28/21 0134 04/28/21 1905 04/29/21 0433 04/30/21 0157  NA 140 139 140  --  138 136 137 136 136  K 3.5 3.5 3.4*  --  3.4* 3.5 3.7 4.2 4.3  CL 115* 114* 112*  --  109 105 105 104 107  CO2 15* 15* 16*  --  16* 18* 19* 19* 18*  GLUCOSE 189* 62* 104*  --  143* 149* 166* 191* 258*  BUN 42* 33* 31*  --  35* 42* 44* 43* 40*  CREATININE 2.34* 2.12* 2.19*  --  2.59* 2.89* 2.99* 2.91* 2.84*  CALCIUM 6.9* 6.6* 6.9*  --  6.7* 6.6* 6.3* 6.5* 6.4*  MG  --   --   --  1.8  --  2.1 1.8 2.2 2.0  PHOS 4.3 4.8* 6.3*  --  6.2* 5.2*  --   --   --    GFR: Estimated Creatinine Clearance: 23.5 mL/min (A) (by C-G formula based on SCr of 2.84 mg/dL (H)). Liver Function Tests: Recent Labs  Lab 04/24/21 0326 04/25/21 0200 04/26/21 0100 04/27/21 0232 04/28/21 0134  ALBUMIN 1.9* 1.8* 1.8* 1.8* 1.9*   CBG: Recent Labs  Lab 04/29/21 1135 04/29/21 1646 04/29/21 2135 04/30/21 0731 04/30/21 1123  GLUCAP 211* 136* 169* 212* 195*    Urine analysis:    Component Value Date/Time   COLORURINE YELLOW 04/24/2021 0446   APPEARANCEUR CLEAR 04/24/2021 0446   APPEARANCEUR Clear 02/09/2018 1352   LABSPEC 1.018 04/24/2021 0446   PHURINE 5.0 04/24/2021 [place] 04/24/2021 0446   HGBUR NEGATIVE 04/24/2021 0446   BILIRUBINUR NEGATIVE 04/24/2021 0446   BILIRUBINUR Negative 02/09/2018 [place] 04/24/2021 0446   PROTEINUR 100 (A) 04/24/2021 0446   NITRITE NEGATIVE 04/24/2021 0446   LEUKOCYTESUR NEGATIVE 04/24/2021 0446   Recent Results (from the past 240 hour(s))  MRSA Next Gen by PCR, Nasal     Status: None   Collection Time: 04/22/21  4:40 PM   Specimen: Nasal Mucosa; Nasal Swab  Result Value Ref Range Status   MRSA by PCR Next Gen NOT DETECTED NOT DETECTED Final    Comment: (NOTE) The GeneXpert MRSA Assay (FDA approved for NASAL specimens only), is one component of a comprehensive MRSA colonization surveillance program. It is  not intended to diagnose MRSA infection nor to guide or monitor treatment for MRSA infections. Test performance is not FDA approved in patients less than 62 years old. Performed at [facility], [place] [address] [place],  [address]       Radiology Studies: No results found.  Scheduled Meds:  amiodarone  400 mg Oral BID   apixaban  5 mg Oral BID   atorvastatin  40 mg Oral Daily   folic acid  1 mg Oral Daily   insulin aspart  0-5 Units Subcutaneous QHS   insulin aspart  0-9 Units Subcutaneous TID WC   insulin aspart  3 Units Subcutaneous TID WC   ipratropium-albuterol  3 mL Nebulization BID  isosorbide dinitrate  20 mg Oral BID   levothyroxine  100 mcg Oral QAC breakfast   metoprolol succinate  100 mg Oral Daily   multivitamin with minerals  1 tablet Oral Daily   pantoprazole  40 mg Oral BID   sodium bicarbonate  1,300 mg Oral TID   thiamine  100 mg Oral Daily   Continuous Infusions:  sodium chloride Stopped (04/20/21 0916)     LOS: 10 days   Time spent: 35 minutes. Vernell Leep, MD, [place], [facility]. [facility]  To contact the attending provider between 7A-7P or the covering provider during after hours 7P-7A, please log into the web site [email or web address] and access using universal [place] password for that web site. If you do not have the password, please call the hospital operator.

## 2021-05-01 DIAGNOSIS — J189 Pneumonia, unspecified organism: Secondary | ICD-10-CM | POA: Diagnosis not present

## 2021-05-01 DIAGNOSIS — E039 Hypothyroidism, unspecified: Secondary | ICD-10-CM | POA: Diagnosis not present

## 2021-05-01 DIAGNOSIS — I482 Chronic atrial fibrillation, unspecified: Secondary | ICD-10-CM | POA: Diagnosis not present

## 2021-05-01 DIAGNOSIS — J9601 Acute respiratory failure with hypoxia: Secondary | ICD-10-CM | POA: Diagnosis not present

## 2021-05-01 DIAGNOSIS — N179 Acute kidney failure, unspecified: Secondary | ICD-10-CM | POA: Diagnosis not present

## 2021-05-01 LAB — RENAL FUNCTION PANEL
Albumin: 2 g/dL — ABNORMAL LOW (ref 3.5–5.0)
Anion gap: 10 (ref 5–15)
BUN: 35 mg/dL — ABNORMAL HIGH (ref 8–23)
CO2: 18 mmol/L — ABNORMAL LOW (ref 22–32)
Calcium: 7 mg/dL — ABNORMAL LOW (ref 8.9–10.3)
Chloride: 111 mmol/L (ref 98–111)
Creatinine, Ser: 2.62 mg/dL — ABNORMAL HIGH (ref 0.61–1.24)
GFR, Estimated: 26 mL/min — ABNORMAL LOW (ref >60)
Glucose, Bld: 173 mg/dL — ABNORMAL HIGH (ref 70–99)
Phosphorus: 3.1 mg/dL (ref 2.5–4.6)
Potassium: 4.1 mmol/L (ref 3.5–5.1)
Sodium: 139 mmol/L (ref 135–145)

## 2021-05-01 LAB — GLUCOSE, CAPILLARY
Glucose-Capillary: 233 mg/dL — ABNORMAL HIGH (ref 70–99)
Glucose-Capillary: 79 mg/dL (ref 70–99)
Glucose-Capillary: 125 mg/dL — ABNORMAL HIGH (ref 70–99)
Glucose-Capillary: 219 mg/dL — ABNORMAL HIGH (ref 70–99)
Glucose-Capillary: 115 mg/dL — ABNORMAL HIGH (ref 70–99)

## 2021-05-01 LAB — PARATHYROID HORMONE, INTACT (NO CA): PTH: 43 pg/mL (ref 15–65)

## 2021-05-01 NOTE — Progress Notes (Signed)
Progress Note  Patient Name: Todd [name] Date of Encounter: 05/01/2021  Primary Cardiologist: Minus Breeding, MD   Subjective   Continuing to feel well today.  No chest pain.  He has had some nonsustained ventricular tachycardia, though he has been asymptomatic.  Inpatient Medications    Scheduled Meds:  amiodarone  400 mg Oral BID   apixaban  5 mg Oral BID   atorvastatin  40 mg Oral Daily   folic acid  1 mg Oral Daily   insulin aspart  0-5 Units Subcutaneous QHS   insulin aspart  0-9 Units Subcutaneous TID WC   insulin aspart  3 Units Subcutaneous TID WC   isosorbide dinitrate  20 mg Oral BID   levothyroxine  100 mcg Oral QAC breakfast   metoprolol succinate  100 mg Oral Daily   multivitamin with minerals  1 tablet Oral Daily   pantoprazole  40 mg Oral BID   sodium bicarbonate  1,300 mg Oral TID   thiamine  100 mg Oral Daily   Continuous Infusions:  sodium chloride Stopped (04/20/21 0916)   PRN Meds: sodium chloride, acetaminophen, ipratropium-albuterol, nicotine   Vital Signs    Vitals:   04/30/21 0807 04/30/21 0833 04/30/21 1440 05/01/21 0819  BP: 133/82  127/71 (!) 135/91  Pulse: 82  79 74  Resp: 16  17 16   Temp: 98 F (36.7 C)  98.8 F (37.1 C)   TempSrc: Oral  Oral   SpO2: 94% 97% 95%   Weight:      Height:        Intake/Output Summary (Last 24 hours) at 05/01/2021 7628 Last data filed at 05/01/2021 0800 Gross per 24 hour  Intake 240 ml  Output 152 ml  Net 88 ml    Filed Weights   04/28/21 0514 04/29/21 0432 04/30/21 0424  Weight: 66 kg 65.4 kg 64.8 kg    Telemetry    Sinus rhythm of nonsustained VT-personally reviewed  ECG    None new  Physical Exam   GEN: Well nourished, well developed, in no acute distress  HEENT: normal  Neck: no JVD, carotid bruits, or masses Cardiac: RRR; no murmurs, rubs, or gallops,no edema  Respiratory:  clear to auscultation bilaterally, normal work of breathing GI: soft, nontender, nondistended, +  BS MS: no deformity or atrophy  Skin: warm and dry, bilateral lower extremity amputation Neuro:  Strength and sensation are intact Psych: euthymic mood, full affect   Labs    Chemistry Recent Labs  Lab 04/27/21 0232 04/28/21 0134 04/28/21 1905 04/29/21 0433 04/30/21 0157 05/01/21 0136  NA 138 136   < > 136 136 139  K 3.4* 3.5   < > 4.2 4.3 4.1  CL 109 105   < > 104 107 111  CO2 16* 18*   < > 19* 18* 18*  GLUCOSE 143* 149*   < > 191* 258* 173*  BUN 35* 42*   < > 43* 40* 35*  CREATININE 2.59* 2.89*   < > 2.91* 2.84* 2.62*  CALCIUM 6.7* 6.6*   < > 6.5* 6.4* 7.0*  ALBUMIN 1.8* 1.9*  --   --   --  2.0*  GFRNONAA 26* 23*   < > 23* 24* 26*  ANIONGAP 13 13   < > 13 11 10    < > = values in this interval not displayed.      Hematology Recent Labs  Lab 04/27/21 0232 04/28/21 0134 04/29/21 0433  WBC 15.8* 16.4* 14.7*  RBC  3.23* 3.29* 3.17*  HGB 9.0* 9.3* 9.0*  HCT 28.1* 28.2* 27.4*  MCV 87.0 85.7 86.4  MCH 27.9 28.3 28.4  MCHC 32.0 33.0 32.8  RDW 15.7* 15.4 15.4  PLT 571* 603* 617*     Cardiac EnzymesNo results for input(s): TROPONINI in the last 168 hours. No results for input(s): TROPIPOC in the last 168 hours.   BNPNo results for input(s): BNP, PROBNP in the last 168 hours.   DDimer No results for input(s): DDIMER in the last 168 hours.   Radiology    No results found.  Cardiac Studies  Echo 04/20/21  1. Left ventricular ejection fraction, by estimation, is 45 to 50%. The  left ventricle has mildly decreased function. There is moderate left  ventricular hypertrophy.   2. Right ventricular systolic function is normal. The right ventricular  size is normal.   3. Left atrial size was severely dilated.   4. Right atrial size was moderately dilated.   5. The inferior vena cava is normal in size with greater than 50%  respiratory variability, suggesting right atrial pressure of 3 mmHg.   6. Limited echo evaluate LV function.   Patient Profile     65 y.o.  male presumed CAD, mild cardiomyopathy with normalization of EF on Echo in 12/2020, paroxysmal atrial fibrillation on Eliquis, PAD s/p bilateral AKA in 2018, hypertension, hyperlipidemia, type 2 diabetes mellitus, hypothyroidism, CKD stage III, polysubstance abuse (cocaine, alcohol, tobacco), and poor compliance who was admitted on 04/19/2021 with acute hypoxic respiratory failure secondary to community acquired pneumonia and CHF.   Assessment & Plan   Principal Problem:   CAP (community acquired pneumonia) Active Problems:   Essential hypertension   Acquired hypothyroidism   Elevated troponin   Acute respiratory failure with hypoxia (HCC)   Hyperglycemia due to diabetes mellitus (HCC)   Leukocytosis   Reactive thrombocytosis   S/P AKA (above knee amputation) bilateral (HCC)   AKI (acute kidney injury) ([place])   Atrial fibrillation, chronic (HCC)   Hypomagnesemia   Elevated brain natriuretic peptide (BNP) level   Polysubstance abuse (HCC)   Noncompliance with medication regimen   Chronic diastolic CHF (congestive heart failure) (HCC)   Hypoalbuminemia due to protein-calorie malnutrition (HCC)   GERD (gastroesophageal reflux disease)   1.  Acute on chronic systolic heart failure with mildly reduced ejection fraction 2.  Nonsustained ventricular tachycardia  Currently on amiodarone.  VT has improved.  We Aaliya Maultsby continue with current load. Cardiac MRI, CT, cath contraindicated in the setting of CKD.  If this improves, Jahki Witham consider ischemic work-up. Continue amiodarone 400 mg twice daily for loading for ventricular tachycardia. Continue Toprol-XL 100 mg daily Hydralazine stopped by nephrology as patient was hypotensive and AKI Lasix being held by nephrology, appreciate their input on when to restart Karrina Lye need an ischemic evaluation with potential cardiac MRI if kidney function improves.  Elevated troponin with presumed coronary artery disease:  Ejection fraction 45% No current chest  pain [place] likely need ischemic evaluation if kidney function improves  Paroxysmal atrial fibrillation Maintaining sinus rhythm after being started on amiodarone Continue Toprol-XL 100 mg daily, Eliquis 5 mg twice daily  Peripheral arterial disease:  Status post bilateral AKA in 2018.  Continue statin.     Hypertension Blood pressure well controlled.  We Shamikia Linskey need to add back medications for heart failure once kidney function begins to improve.  Hyperlipidemia: Continue Crestor 40 mg daily  Type 2 diabetes: Hemoglobin A1c 7.9 management per primary team  Acute on  chronic CKD stage III: Nephrology helping to manage diuresis.        For questions or updates, please contact [place] Please consult [email or web address] for contact info under        Signed, Dorr Perrot Meredith Leeds, MD  05/01/2021, 9:22 AM

## 2021-05-01 NOTE — Progress Notes (Signed)
PROGRESS NOTE  Todd [name]  ZOX:[id number] DOB: February 12, 1956 DOA: 04/19/2021 PCP: [name], FNP  Brief Narrative: Todd [name] is a 65 y.o. male recently admitted from 01/10/2021 to 01/15/2021 due to chronic A. fib with RVR, with medical history significant for essential hypertension, hypothyroidism, type 2 diabetes mellitus, chronic atrial fibrillation, status post bilateral AKA, history of polysubstance abuse (alcohol, tobacco and cocaine) who presents to [facility] ED from [facility] via EMS due to shortness of breath.  He was noted to be hypoxic, in the 80% on room air at the SNF, neb treatment was provided and patient was placed on a supplemental oxygen via [place] at 2 LPM with minimal improvement.  EMS was activated and on arrival, O2 supplementation was increased to 4 LPM en route to the hospital.   Work-up revealed acute hypoxic respiratory failure, right-sided community-acquired pneumonia, mild pulmonary edema, elevated troponin, elevated BNP greater than 800, acute on chronic diastolic CHF, AKI, non-anion gap metabolic acidosis, and electrolyte abnormalities.  Echocardiogram revealed LV systolic dysfunction and monitoring detected RVR. Lasix was given with subsequently increasing creatinine. Diuresis pushed despite renal impairment due to progressive pulmonary edema on CXR with hypoxia.   Assessment & Plan: Principal Problem:   CAP (community acquired pneumonia) Active Problems:   Essential hypertension   Acquired hypothyroidism   Elevated troponin   Acute respiratory failure with hypoxia (HCC)   Hyperglycemia due to diabetes mellitus (HCC)   Leukocytosis   Reactive thrombocytosis   S/P AKA (above knee amputation) bilateral (HCC)   AKI (acute kidney injury) ([place])   Atrial fibrillation, chronic (HCC)   Hypomagnesemia   Elevated brain natriuretic peptide (BNP) level   Polysubstance abuse (HCC)   Noncompliance with medication regimen   Chronic diastolic CHF (congestive  heart failure) (HCC)   Hypoalbuminemia due to protein-calorie malnutrition (HCC)   GERD (gastroesophageal reflux disease)  Acute hypoxic respiratory failure: Presumed to be due to CHF and right-sided pneumonia, COPD exacerbation. CHF suspected primary component based on progressive infiltrates that quickly resolved with diuresis.  - Liberated from supplemental oxygen.   Sustained VT, asymptomatic: Suspected ischemic etiology per cardiology.  - Will require work up (cMRI, LHC) though renal impairment is currently prohibitive.  - Continue amiodarone loading - Continue cardiac monitoring.  - Keep K >4, Mg >2.  PAF, multifocal atrial tachycardia: Recent admit for RVR.  - Continue metoprolol succinate, increased to 100mg   - Continue eliquis for anticoagulation - Maintain K >4, Mg >2.   Acute on chronic HFrEF, HTN: LVEF 45-50% without RWMA, +LVH, normal IVC. Severe LAE, moderate RAE. BNP elevated.  - Cardiology consulted, deferring ischemic eval for now.  - Converted norvasc to bidil. Continue metoprolol succinate.   AKI with NAGMA: No abnormality on U/S, though did have trace perinephric fluid on right. Bland sediment with definite proteinuria 1.03. FENa not consistent with prerenal azotemia at 1.78%. Note history of worse AKIs that were associated with worse acidosis in the past.  - Suspect insult was primarily related to vancomycin, now will need continued monitoring with diuresis.   - Continue bicarbonate tabs  Demand myocardial ischemia: Mild troponin elevation with reassuring delta no anginal complaints.  - Defer w/u to cardiology  Right-sided CAP: Progressive infiltrates with hypoxia overnight into 11/10. Abx coverage expanded, though infiltrates continue to increase despite improving WBC, reassuring PCT and adequate abx coverage. - Completed azithromycin x5 days. Stopped vanc w/nephrotoxicity and negative MRSA PCR. Completed 7 days of zosyn.   - Monitor culture data. Blood Cx's  NGTD  x5 days, no sputum culture obtained.  COPD with exacerbation:  - Continue inhaled BDs, avoiding steroids due to severe hyperglycemia.  Uncontrolled T2DM with steroid-induced hyperglycemia: HbA1c 7.9%.  - Insulin to continue TIDWC + SSI novolog.  Polysubstance abuse: Previously +alcohol, tobacco, cocaine.  - Cessation counseling.  - Continue nicotine patch.   Hypocalcemia: Supplemented in setting of cardiac irritability.  - Mgmt per nephrology  Hypothyroidism: Repeat TSH is 0.315, last was 20.52 in September 2022 which was improved from 189 in July in setting of medication nonadherence. Free T4 is 1.12 which is the ULN.  - In setting of AFib/MAT, we did decrease synthroid dose 164mcg > 131mcg.   Anemia of chronic disease: Having a decline in hgb from admission, though all cell lines are down and hgb was above baseline on admission.  - Hemoglobin has stabilized somewhat near baseline ~9g/dl   HTN: Tx as above  PVD s/p bilateral AKA (2018-2019):  - Continue statin, anticoagulation  GERD:  - Continue PPI  Suspected malnutrition: Albumin is 1.8. - Dietitian consulted.   DVT prophylaxis: Eliquis Code Status: Full Family Communication: None at bedside Disposition Plan:  Status is: Inpatient  Remains inpatient appropriate because: requires treatment for acute CHF, AKI, and arrhythmias. Continues to have NSVT.  Consultants:  Cardiology Nephrology  Procedures:  Echocardiogram  Antimicrobials: Ceftriaxone, azithromycin > vancomycin, zosyn.  Subjective: No complaints today. He's breathing fine, back to his baseline. Denies palpitations or bleeding. No chest discomfort or fever.   Objective: Vitals:   04/30/21 0807 04/30/21 0833 04/30/21 1440 05/01/21 0819  BP: 133/82  127/71 (!) 135/91  Pulse: 82  79 74  Resp: 16  17 16   Temp: 98 F (36.7 C)  98.8 F (37.1 C)   TempSrc: Oral  Oral   SpO2: 94% 97% 95%   Weight:      Height:        Intake/Output Summary (Last 24  hours) at 05/01/2021 1319 Last data filed at 05/01/2021 0800 Gross per 24 hour  Intake 240 ml  Output 152 ml  Net 88 ml   Filed Weights   04/28/21 0514 04/29/21 0432 04/30/21 0424  Weight: 66 kg 65.4 kg 64.8 kg   Gen: 65 y.o. male in no distress Pulm: Nonlabored breathing room air. Clear anterolaterally. CV: Regular rate and rhythm with occasional premature beat. No murmur, rub, or gallop. No JVD, no dependent edema. GI: Abdomen soft, non-tender, non-distended, with normoactive bowel sounds.  Ext: Warm, BL AKAs Skin: No new rashes, lesions or ulcers on visualized skin. Neuro: Alert and oriented. No focal neurological deficits. Psych: Judgement and insight appear fair. Mood euthymic & affect congruent. Behavior is appropriate.     Data Reviewed: I have personally reviewed following labs and imaging studies  CBC: Recent Labs  Lab 04/27/21 0232 04/28/21 0134 04/29/21 0433  WBC 15.8* 16.4* 14.7*  NEUTROABS 12.8*  --   --   HGB 9.0* 9.3* 9.0*  HCT 28.1* 28.2* 27.4*  MCV 87.0 85.7 86.4  PLT 571* 603* 563*   Basic Metabolic Panel: Recent Labs  Lab 04/25/21 0200 04/26/21 0100 04/26/21 0920 04/27/21 0232 04/28/21 0134 04/28/21 1905 04/29/21 0433 04/30/21 0157 05/01/21 0136  NA 139 140  --  138 136 137 136 136 139  K 3.5 3.4*  --  3.4* 3.5 3.7 4.2 4.3 4.1  CL 114* 112*  --  109 105 105 104 107 111  CO2 15* 16*  --  16* 18* 19* 19* 18* 18*  GLUCOSE 62* 104*  --  143* 149* 166* 191* 258* 173*  BUN 33* 31*  --  35* 42* 44* 43* 40* 35*  CREATININE 2.12* 2.19*  --  2.59* 2.89* 2.99* 2.91* 2.84* 2.62*  CALCIUM 6.6* 6.9*  --  6.7* 6.6* 6.3* 6.5* 6.4* 7.0*  MG  --   --  1.8  --  2.1 1.8 2.2 2.0  --   PHOS 4.8* 6.3*  --  6.2* 5.2*  --   --   --  3.1   GFR: Estimated Creatinine Clearance: 25.4 mL/min (A) (by C-G formula based on SCr of 2.62 mg/dL (H)). Liver Function Tests: Recent Labs  Lab 04/25/21 0200 04/26/21 0100 04/27/21 0232 04/28/21 0134 05/01/21 0136  ALBUMIN  1.8* 1.8* 1.8* 1.9* 2.0*   No results for input(s): LIPASE, AMYLASE in the last 168 hours. No results for input(s): AMMONIA in the last 168 hours. Coagulation Profile: No results for input(s): INR, PROTIME in the last 168 hours.  Cardiac Enzymes: No results for input(s): CKTOTAL, CKMB, CKMBINDEX, TROPONINI in the last 168 hours. BNP (last 3 results) No results for input(s): PROBNP in the last 8760 hours. HbA1C: No results for input(s): HGBA1C in the last 72 hours.  CBG: Recent Labs  Lab 04/30/21 1123 04/30/21 1624 04/30/21 2116 05/01/21 0752 05/01/21 1256  GLUCAP 195* 202* 180* 233* 79   Lipid Profile: No results for input(s): CHOL, HDL, LDLCALC, TRIG, CHOLHDL, LDLDIRECT in the last 72 hours. Thyroid Function Tests: No results for input(s): TSH, T4TOTAL, FREET4, T3FREE, THYROIDAB in the last 72 hours.  Anemia Panel: No results for input(s): VITAMINB12, FOLATE, FERRITIN, TIBC, IRON, RETICCTPCT in the last 72 hours. Urine analysis:    Component Value Date/Time   COLORURINE YELLOW 04/24/2021 [place] 04/24/2021 0446   APPEARANCEUR Clear 02/09/2018 1352   LABSPEC 1.018 04/24/2021 0446   PHURINE 5.0 04/24/2021 0446   GLUCOSEU NEGATIVE 04/24/2021 0446   HGBUR NEGATIVE 04/24/2021 0446   BILIRUBINUR NEGATIVE 04/24/2021 0446   BILIRUBINUR Negative 02/09/2018 [place] 04/24/2021 0446   PROTEINUR 100 (A) 04/24/2021 0446   NITRITE NEGATIVE 04/24/2021 0446   LEUKOCYTESUR NEGATIVE 04/24/2021 0446   Recent Results (from the past 240 hour(s))  MRSA Next Gen by PCR, Nasal     Status: None   Collection Time: 04/22/21  4:40 PM   Specimen: Nasal Mucosa; Nasal Swab  Result Value Ref Range Status   MRSA by PCR Next Gen NOT DETECTED NOT DETECTED Final    Comment: (NOTE) The GeneXpert MRSA Assay (FDA approved for NASAL specimens only), is one component of a comprehensive MRSA colonization surveillance program. It is not intended to diagnose MRSA  infection nor to guide or monitor treatment for MRSA infections. Test performance is not FDA approved in patients less than 31 years old. Performed at [facility], [place] [address] [place], [place] [address]       Radiology Studies: No results found.  Scheduled Meds:  amiodarone  400 mg Oral BID   apixaban  5 mg Oral BID   atorvastatin  40 mg Oral Daily   folic acid  1 mg Oral Daily   insulin aspart  0-5 Units Subcutaneous QHS   insulin aspart  0-9 Units Subcutaneous TID WC   insulin aspart  3 Units Subcutaneous TID WC   isosorbide dinitrate  20 mg Oral BID   levothyroxine  100 mcg Oral QAC breakfast   metoprolol succinate  100 mg Oral Daily  multivitamin with minerals  1 tablet Oral Daily   pantoprazole  40 mg Oral BID   sodium bicarbonate  1,300 mg Oral TID   thiamine  100 mg Oral Daily   Continuous Infusions:  sodium chloride Stopped (04/20/21 0916)     LOS: 11 days   Time spent: 25 minutes.  Patrecia Pour, MD [facility] [email or web address] 05/01/2021, 1:19 PM

## 2021-05-01 NOTE — Progress Notes (Signed)
Patient ID: Todd [name], male   DOB: 1956/01/30, 65 y.o.   MRN: [id number] S: No complaints. O:BP (!) 135/91   Pulse 74   Temp 98.8 F (37.1 C) (Oral)   Resp 16   Ht 5\' 8"  (1.727 m)   Wt 64.8 kg   SpO2 95%   BMI 21.72 kg/m   Intake/Output Summary (Last 24 hours) at 05/01/2021 1146 Last data filed at 05/01/2021 0800 Gross per 24 hour  Intake 240 ml  Output 152 ml  Net 88 ml   Intake/Output: I/O last 3 completed shifts: In: 813.5 [P.O.:720; I.V.:93.5] Out: 1000 [Urine:1000]  Intake/Output this shift:  Total I/O In: -  Out: 152 [Urine:151; Stool:1] Weight change:  Gen:NAD CVS:RRR Resp: CTA Abd: +BS, soft,NT/ND Ext: s/p bilateral BKA's, no edema  Recent Labs  Lab 04/25/21 0200 04/26/21 0100 04/27/21 0232 04/28/21 0134 04/28/21 1905 04/29/21 0433 04/30/21 0157 05/01/21 0136  NA 139 140 138 136 137 136 136 139  K 3.5 3.4* 3.4* 3.5 3.7 4.2 4.3 4.1  CL 114* 112* 109 105 105 104 107 111  CO2 15* 16* 16* 18* 19* 19* 18* 18*  GLUCOSE 62* 104* 143* 149* 166* 191* 258* 173*  BUN 33* 31* 35* 42* 44* 43* 40* 35*  CREATININE 2.12* 2.19* 2.59* 2.89* 2.99* 2.91* 2.84* 2.62*  ALBUMIN 1.8* 1.8* 1.8* 1.9*  --   --   --  2.0*  CALCIUM 6.6* 6.9* 6.7* 6.6* 6.3* 6.5* 6.4* 7.0*  PHOS 4.8* 6.3* 6.2* 5.2*  --   --   --  3.1   Liver Function Tests: Recent Labs  Lab 04/27/21 0232 04/28/21 0134 05/01/21 0136  ALBUMIN 1.8* 1.9* 2.0*   No results for input(s): LIPASE, AMYLASE in the last 168 hours. No results for input(s): AMMONIA in the last 168 hours. CBC: Recent Labs  Lab 04/27/21 0232 04/28/21 0134 04/29/21 0433  WBC 15.8* 16.4* 14.7*  NEUTROABS 12.8*  --   --   HGB 9.0* 9.3* 9.0*  HCT 28.1* 28.2* 27.4*  MCV 87.0 85.7 86.4  PLT 571* 603* 617*   Cardiac Enzymes: No results for input(s): CKTOTAL, CKMB, CKMBINDEX, TROPONINI in the last 168 hours. CBG: Recent Labs  Lab 04/30/21 0731 04/30/21 1123 04/30/21 1624 04/30/21 2116 05/01/21 0752  GLUCAP 212* 195*  202* 180* 233*    Iron Studies: No results for input(s): IRON, TIBC, TRANSFERRIN, FERRITIN in the last 72 hours. Studies/Results: No results found.  amiodarone  400 mg Oral BID   apixaban  5 mg Oral BID   atorvastatin  40 mg Oral Daily   folic acid  1 mg Oral Daily   insulin aspart  0-5 Units Subcutaneous QHS   insulin aspart  0-9 Units Subcutaneous TID WC   insulin aspart  3 Units Subcutaneous TID WC   isosorbide dinitrate  20 mg Oral BID   levothyroxine  100 mcg Oral QAC breakfast   metoprolol succinate  100 mg Oral Daily   multivitamin with minerals  1 tablet Oral Daily   pantoprazole  40 mg Oral BID   sodium bicarbonate  1,300 mg Oral TID   thiamine  100 mg Oral Daily    BMET    Component Value Date/Time   NA 139 05/01/2021 0136   NA 136 09/22/2020 0923   K 4.1 05/01/2021 0136   CL 111 05/01/2021 0136   CO2 18 (L) 05/01/2021 0136   GLUCOSE 173 (H) 05/01/2021 0136   BUN 35 (H) 05/01/2021 0136   BUN 13  09/22/2020 0923   CREATININE 2.62 (H) 05/01/2021 0136   CALCIUM 7.0 (L) 05/01/2021 0136   GFRNONAA 26 (L) 05/01/2021 0136   GFRAA 90 08/06/2020 0805   CBC    Component Value Date/Time   WBC 14.7 (H) 04/29/2021 0433   RBC 3.17 (L) 04/29/2021 0433   HGB 9.0 (L) 04/29/2021 0433   HGB 13.5 01/27/2020 0859   HCT 27.4 (L) 04/29/2021 0433   HCT 39.7 01/27/2020 0859   PLT 617 (H) 04/29/2021 0433   PLT 342 01/27/2020 0859   MCV 86.4 04/29/2021 0433   MCV 83 01/27/2020 0859   MCH 28.4 04/29/2021 0433   MCHC 32.8 04/29/2021 0433   RDW 15.4 04/29/2021 0433   RDW 14.2 01/27/2020 0859   LYMPHSABS 1.6 04/27/2021 0232   LYMPHSABS 2.3 01/27/2020 0859   MONOABS 1.1 (H) 04/27/2021 0232   EOSABS 0.1 04/27/2021 0232   EOSABS 0.2 01/27/2020 0859   BASOSABS 0.1 04/27/2021 0232   BASOSABS 0.1 01/27/2020 0859    Assessment/Plan:   AKI/CKD stage IIIa, non-oliguric - presumably hemodynamically mediated with hypotension, arrhythmias, and IV diuresis.  BUN/Cr have reached a  plateau and now appear to be slowly improving.  No uremic symptoms or indication for dialysis.  Hydralazine stopped due to hypotension.  Lasix held as well.  Renal US without obstruction.  He has had multiple episodes of AKI/CKD over the past few years and likely related to cardiorenal syndrome in the past.   BUN/Cr slowly improving. Continue to hold lasix and follow renal function. Acute hypoxic respiratory failure - due to combination of CHF and PNA.  Improving since admission. NSVT - EP evaluating.  Acute on chronic diastolic CHF - resolved. NAGMA - continue with sodium bicarbonate tabs. Hypocalcemia - replace with IV calcium and will check vit D levels and iPTH.  Likely worsened with bicarb.    Donetta Potts, MD [employer] [phone]

## 2021-05-02 DIAGNOSIS — J189 Pneumonia, unspecified organism: Secondary | ICD-10-CM | POA: Diagnosis not present

## 2021-05-02 DIAGNOSIS — N179 Acute kidney failure, unspecified: Secondary | ICD-10-CM | POA: Diagnosis not present

## 2021-05-02 DIAGNOSIS — J9601 Acute respiratory failure with hypoxia: Secondary | ICD-10-CM | POA: Diagnosis not present

## 2021-05-02 DIAGNOSIS — E039 Hypothyroidism, unspecified: Secondary | ICD-10-CM | POA: Diagnosis not present

## 2021-05-02 DIAGNOSIS — I482 Chronic atrial fibrillation, unspecified: Secondary | ICD-10-CM | POA: Diagnosis not present

## 2021-05-02 LAB — RENAL FUNCTION PANEL
Albumin: 1.8 g/dL — ABNORMAL LOW (ref 3.5–5.0)
Anion gap: 11 (ref 5–15)
BUN: 31 mg/dL — ABNORMAL HIGH (ref 8–23)
CO2: 19 mmol/L — ABNORMAL LOW (ref 22–32)
Calcium: 6.9 mg/dL — ABNORMAL LOW (ref 8.9–10.3)
Chloride: 109 mmol/L (ref 98–111)
Creatinine, Ser: 2.3 mg/dL — ABNORMAL HIGH (ref 0.61–1.24)
GFR, Estimated: 31 mL/min — ABNORMAL LOW (ref >60)
Glucose, Bld: 146 mg/dL — ABNORMAL HIGH (ref 70–99)
Phosphorus: 3.4 mg/dL (ref 2.5–4.6)
Potassium: 3.8 mmol/L (ref 3.5–5.1)
Sodium: 139 mmol/L (ref 135–145)

## 2021-05-02 LAB — GLUCOSE, CAPILLARY
Glucose-Capillary: 122 mg/dL — ABNORMAL HIGH (ref 70–99)
Glucose-Capillary: 184 mg/dL — ABNORMAL HIGH (ref 70–99)
Glucose-Capillary: 141 mg/dL — ABNORMAL HIGH (ref 70–99)
Glucose-Capillary: 83 mg/dL (ref 70–99)

## 2021-05-02 LAB — CALCIUM, IONIZED: Calcium, Ionized, Serum: 3.9 mg/dL — ABNORMAL LOW (ref 4.5–5.6)

## 2021-05-02 MED ORDER — SERTRALINE HCL 50 MG PO TABS
50.0000 mg | ORAL_TABLET | Freq: Every day | ORAL | Status: DC
  Administered 2021-05-02 – 2021-05-10 (×9): 50 mg via ORAL
  Filled 2021-05-02 (×10): qty 1

## 2021-05-02 MED ORDER — CALCIUM CARBONATE 1250 (500 CA) MG PO TABS
1250.0000 mg | ORAL_TABLET | Freq: Every day | ORAL | Status: DC
  Administered 2021-05-02 – 2021-05-10 (×9): 1250 mg via ORAL
  Filled 2021-05-02 (×9): qty 1

## 2021-05-02 NOTE — Progress Notes (Signed)
Patient for the past 2 hours had become markedly tearful, crying, wouldn't specifically tell me why he was sad. He denied suicidal ideations. Patients sisters at bedside and concerned that patients Zoloft not restarted. I called and spoke with Dr. Bonner Puna and he came up to assess patient and speak with sisters. Patient did say when I asked family to step out that it was "more emotional when family comes." Dr. Bonner Puna reordered Zoloft. Patient and family updated at bedside. Will continue to assess patient for needs

## 2021-05-02 NOTE — Progress Notes (Signed)
Progress Note  Patient Name: Todd [name] Date of Encounter: 05/02/2021  Primary Cardiologist: Minus Breeding, MD   Subjective   Continuing to feel well without chest pain or shortness of breath.  Arrhythmias have remained stable.   Inpatient Medications    Scheduled Meds:  amiodarone  400 mg Oral BID   apixaban  5 mg Oral BID   atorvastatin  40 mg Oral Daily   folic acid  1 mg Oral Daily   insulin aspart  0-5 Units Subcutaneous QHS   insulin aspart  0-9 Units Subcutaneous TID WC   insulin aspart  3 Units Subcutaneous TID WC   isosorbide dinitrate  20 mg Oral BID   levothyroxine  100 mcg Oral QAC breakfast   metoprolol succinate  100 mg Oral Daily   multivitamin with minerals  1 tablet Oral Daily   pantoprazole  40 mg Oral BID   sodium bicarbonate  1,300 mg Oral TID   thiamine  100 mg Oral Daily   Continuous Infusions:  sodium chloride Stopped (04/20/21 0916)   PRN Meds: sodium chloride, acetaminophen, ipratropium-albuterol, nicotine   Vital Signs    Vitals:   05/01/21 1412 05/01/21 2018 05/02/21 0548 05/02/21 0932  BP:  126/72 130/72 128/69  Pulse: 72 73 72 78  Resp: 17 20 (!) 24 20  Temp: 98 F (36.7 C) 98.5 F (36.9 C) 98.2 F (36.8 C)   TempSrc: Axillary Oral Oral   SpO2: 99% 90% 95% 96%  Weight:   67.1 kg   Height:        Intake/Output Summary (Last 24 hours) at 05/02/2021 1039 Last data filed at 05/02/2021 0932 Gross per 24 hour  Intake --  Output 152 ml  Net -152 ml    Filed Weights   04/29/21 0432 04/30/21 0424 05/02/21 0548  Weight: 65.4 kg 64.8 kg 67.1 kg    Telemetry    Sinus rhythm-personally reviewed  ECG    None new  Physical Exam   GEN: Well nourished, well developed, in no acute distress  HEENT: normal  Neck: no JVD, carotid bruits, or masses Cardiac: RRR; no murmurs, rubs, or gallops,no edema  Respiratory:  clear to auscultation bilaterally, normal work of breathing GI: soft, nontender, nondistended, + BS MS: no  deformity or atrophy  Skin: warm and dry, bilateral AKA Neuro:  Strength and sensation are intact Psych: euthymic mood, full affect   Labs    Chemistry Recent Labs  Lab 04/28/21 0134 04/28/21 1905 04/30/21 0157 05/01/21 0136 05/02/21 0206  NA 136   < > 136 139 139  K 3.5   < > 4.3 4.1 3.8  CL 105   < > 107 111 109  CO2 18*   < > 18* 18* 19*  GLUCOSE 149*   < > 258* 173* 146*  BUN 42*   < > 40* 35* 31*  CREATININE 2.89*   < > 2.84* 2.62* 2.30*  CALCIUM 6.6*   < > 6.4* 7.0* 6.9*  ALBUMIN 1.9*  --   --  2.0* 1.8*  GFRNONAA 23*   < > 24* 26* 31*  ANIONGAP 13   < > 11 10 11    < > = values in this interval not displayed.      Hematology Recent Labs  Lab 04/27/21 0232 04/28/21 0134 04/29/21 0433  WBC 15.8* 16.4* 14.7*  RBC 3.23* 3.29* 3.17*  HGB 9.0* 9.3* 9.0*  HCT 28.1* 28.2* 27.4*  MCV 87.0 85.7 86.4  MCH 27.9 28.3  28.4  MCHC 32.0 33.0 32.8  RDW 15.7* 15.4 15.4  PLT 571* 603* 617*     Cardiac EnzymesNo results for input(s): TROPONINI in the last 168 hours. No results for input(s): TROPIPOC in the last 168 hours.   BNPNo results for input(s): BNP, PROBNP in the last 168 hours.   DDimer No results for input(s): DDIMER in the last 168 hours.   Radiology    No results found.  Cardiac Studies  Echo 04/20/21  1. Left ventricular ejection fraction, by estimation, is 45 to 50%. The  left ventricle has mildly decreased function. There is moderate left  ventricular hypertrophy.   2. Right ventricular systolic function is normal. The right ventricular  size is normal.   3. Left atrial size was severely dilated.   4. Right atrial size was moderately dilated.   5. The inferior vena cava is normal in size with greater than 50%  respiratory variability, suggesting right atrial pressure of 3 mmHg.   6. Limited echo evaluate LV function.   Patient Profile     65 y.o. male presumed CAD, mild cardiomyopathy with normalization of EF on Echo in 12/2020, paroxysmal atrial  fibrillation on Eliquis, PAD s/p bilateral AKA in 2018, hypertension, hyperlipidemia, type 2 diabetes mellitus, hypothyroidism, CKD stage III, polysubstance abuse (cocaine, alcohol, tobacco), and poor compliance who was admitted on 04/19/2021 with acute hypoxic respiratory failure secondary to community acquired pneumonia and CHF.   Assessment & Plan   Principal Problem:   CAP (community acquired pneumonia) Active Problems:   Essential hypertension   Acquired hypothyroidism   Elevated troponin   Acute respiratory failure with hypoxia (HCC)   Hyperglycemia due to diabetes mellitus (HCC)   Leukocytosis   Reactive thrombocytosis   S/P AKA (above knee amputation) bilateral (HCC)   AKI (acute kidney injury) ([place])   Atrial fibrillation, chronic (HCC)   Hypomagnesemia   Elevated brain natriuretic peptide (BNP) level   Polysubstance abuse (HCC)   Noncompliance with medication regimen   Chronic diastolic CHF (congestive heart failure) (HCC)   Hypoalbuminemia due to protein-calorie malnutrition (HCC)   GERD (gastroesophageal reflux disease)   1.  Acute on chronic systolic heart failure with mildly reduced ejection fraction 2.  Nonsustained ventricular tachycardia  Currently on amiodarone.  VT has improved with current loading.  He Olivya Sobol likely need an ischemic evaluation, though with his kidney function we Breonia Kirstein hold off.  Continue Toprol-XL.  Lasix being held by nephrology.  Appreciate their input.  2.  Elevated troponin with presumed coronary artery disease: Ejection fraction 45%.  No current chest pain.  Likely need an ischemic evaluation if kidney function improves.  3.  Paroxysmal atrial fibrillation: Maintaining sinus rhythm on amiodarone.  Continue Eliquis and Toprol-XL.  4.  Peripheral arterial disease: Status post bilateral AKA in 2018.  Continue statin.  5.  Hypertension: Blood pressure well controlled.  6.  Hyperlipidemia: Continue Crestor 40 mg  7.  Acute on chronic CKD stage  III: Currently managed by nephrology.  Creatinine has continued to improve.      For questions or updates, please contact [place] Please consult [email or web address] for contact info under        Signed, Antoino Westhoff Meredith Leeds, MD  05/02/2021, 10:39 AM

## 2021-05-02 NOTE — Progress Notes (Signed)
Patient ID: Todd [name], male   DOB: Jun 10, 1956, 65 y.o.   MRN: [id number] S: Feels well, no complaints. O:BP 128/69   Pulse 78   Temp 98.2 F (36.8 C) (Oral)   Resp 20   Ht 5\' 8"  (1.727 m)   Wt 67.1 kg   SpO2 96%   BMI 22.50 kg/m   Intake/Output Summary (Last 24 hours) at 05/02/2021 1101 Last data filed at 05/02/2021 0932 Gross per 24 hour  Intake --  Output 152 ml  Net -152 ml   Intake/Output: I/O last 3 completed shifts: In: 240 [P.O.:240] Out: 154 [Urine:152; Stool:2]  Intake/Output this shift:  Total I/O In: -  Out: 150 [Urine:150] Weight change:  Gen:NAD CVS:RRR Resp: CTA Abd: +BS, soft, NT/ND Ext: s/p bilateral BKA's, no edema  Recent Labs  Lab 04/26/21 0100 04/27/21 0232 04/28/21 0134 04/28/21 1905 04/29/21 0433 04/30/21 0157 05/01/21 0136 05/02/21 0206  NA 140 138 136 137 136 136 139 139  K 3.4* 3.4* 3.5 3.7 4.2 4.3 4.1 3.8  CL 112* 109 105 105 104 107 111 109  CO2 16* 16* 18* 19* 19* 18* 18* 19*  GLUCOSE 104* 143* 149* 166* 191* 258* 173* 146*  BUN 31* 35* 42* 44* 43* 40* 35* 31*  CREATININE 2.19* 2.59* 2.89* 2.99* 2.91* 2.84* 2.62* 2.30*  ALBUMIN 1.8* 1.8* 1.9*  --   --   --  2.0* 1.8*  CALCIUM 6.9* 6.7* 6.6* 6.3* 6.5* 6.4* 7.0* 6.9*  PHOS 6.3* 6.2* 5.2*  --   --   --  3.1 3.4   Liver Function Tests: Recent Labs  Lab 04/28/21 0134 05/01/21 0136 05/02/21 0206  ALBUMIN 1.9* 2.0* 1.8*   No results for input(s): LIPASE, AMYLASE in the last 168 hours. No results for input(s): AMMONIA in the last 168 hours. CBC: Recent Labs  Lab 04/27/21 0232 04/28/21 0134 04/29/21 0433  WBC 15.8* 16.4* 14.7*  NEUTROABS 12.8*  --   --   HGB 9.0* 9.3* 9.0*  HCT 28.1* 28.2* 27.4*  MCV 87.0 85.7 86.4  PLT 571* 603* 617*   Cardiac Enzymes: No results for input(s): CKTOTAL, CKMB, CKMBINDEX, TROPONINI in the last 168 hours. CBG: Recent Labs  Lab 05/01/21 1256 05/01/21 1410 05/01/21 1737 05/01/21 2243 05/02/21 0743  GLUCAP 79 125* 219* 115*  122*    Iron Studies: No results for input(s): IRON, TIBC, TRANSFERRIN, FERRITIN in the last 72 hours. Studies/Results: No results found.  amiodarone  400 mg Oral BID   apixaban  5 mg Oral BID   atorvastatin  40 mg Oral Daily   folic acid  1 mg Oral Daily   insulin aspart  0-5 Units Subcutaneous QHS   insulin aspart  0-9 Units Subcutaneous TID WC   insulin aspart  3 Units Subcutaneous TID WC   isosorbide dinitrate  20 mg Oral BID   levothyroxine  100 mcg Oral QAC breakfast   metoprolol succinate  100 mg Oral Daily   multivitamin with minerals  1 tablet Oral Daily   pantoprazole  40 mg Oral BID   sodium bicarbonate  1,300 mg Oral TID   thiamine  100 mg Oral Daily    BMET    Component Value Date/Time   NA 139 05/02/2021 0206   NA 136 09/22/2020 0923   K 3.8 05/02/2021 0206   CL 109 05/02/2021 0206   CO2 19 (L) 05/02/2021 0206   GLUCOSE 146 (H) 05/02/2021 0206   BUN 31 (H) 05/02/2021 0206   BUN 13  09/22/2020 0923   CREATININE 2.30 (H) 05/02/2021 0206   CALCIUM 6.9 (L) 05/02/2021 0206   GFRNONAA 31 (L) 05/02/2021 0206   GFRAA 90 08/06/2020 0805   CBC    Component Value Date/Time   WBC 14.7 (H) 04/29/2021 0433   RBC 3.17 (L) 04/29/2021 0433   HGB 9.0 (L) 04/29/2021 0433   HGB 13.5 01/27/2020 0859   HCT 27.4 (L) 04/29/2021 0433   HCT 39.7 01/27/2020 0859   PLT 617 (H) 04/29/2021 0433   PLT 342 01/27/2020 0859   MCV 86.4 04/29/2021 0433   MCV 83 01/27/2020 0859   MCH 28.4 04/29/2021 0433   MCHC 32.8 04/29/2021 0433   RDW 15.4 04/29/2021 0433   RDW 14.2 01/27/2020 0859   LYMPHSABS 1.6 04/27/2021 0232   LYMPHSABS 2.3 01/27/2020 0859   MONOABS 1.1 (H) 04/27/2021 0232   EOSABS 0.1 04/27/2021 0232   EOSABS 0.2 01/27/2020 0859   BASOSABS 0.1 04/27/2021 0232   BASOSABS 0.1 01/27/2020 0859    Assessment/Plan:   AKI/CKD stage IIIa, non-oliguric - presumably hemodynamically mediated with hypotension, arrhythmias, and IV diuresis.  BUN/Cr have reached a plateau and now  appear to be slowly improving.  No uremic symptoms or indication for dialysis.  Hydralazine stopped due to hypotension.  Lasix held as well.  Renal US without obstruction.  He has had multiple episodes of AKI/CKD over the past few years and likely related to cardiorenal syndrome in the past.   BUN/Cr continue to slowly improve off of lasix. Continue to hold lasix and follow renal function. Acute hypoxic respiratory failure - due to combination of CHF and PNA.  Improving since admission. NSVT - EP evaluating.  Acute on chronic diastolic CHF - resolved. NAGMA - continue with sodium bicarbonate tabs. Hypocalcemia - replace with IV calcium and will start po supplements.  25 hydroxy vit D level ok and iPTH 43.  Likely worsened with bicarb.    Donetta Potts, MD [employer] [phone]

## 2021-05-02 NOTE — Progress Notes (Signed)
PROGRESS NOTE  Todd [name]  OIN:[id number] DOB: [birth year] DOA: 04/19/2021 PCP: [name], FNP  Brief Narrative: Todd [name] is a 65 y.o. male recently admitted from 01/10/2021 to 01/15/2021 due to chronic A. fib with RVR, with medical history significant for essential hypertension, hypothyroidism, type 2 diabetes mellitus, chronic atrial fibrillation, status post bilateral AKA, history of polysubstance abuse (alcohol, tobacco and cocaine) who presents to [facility] ED from [facility] via EMS due to shortness of breath.  He was noted to be hypoxic, in the 80% on room air at the SNF, neb treatment was provided and patient was placed on a supplemental oxygen via [place] at 2 LPM with minimal improvement.  EMS was activated and on arrival, O2 supplementation was increased to 4 LPM en route to the hospital.   Work-up revealed acute hypoxic respiratory failure, right-sided community-acquired pneumonia, mild pulmonary edema, elevated troponin, elevated BNP greater than 800, acute on chronic diastolic CHF, AKI, non-anion gap metabolic acidosis, and electrolyte abnormalities.  Echocardiogram revealed LV systolic dysfunction and monitoring detected RVR. Lasix was given with subsequently increasing creatinine. Diuresis pushed despite renal impairment due to progressive pulmonary edema on CXR with hypoxia.   Assessment & Plan: Principal Problem:   CAP (community acquired pneumonia) Active Problems:   Essential hypertension   Acquired hypothyroidism   Elevated troponin   Acute respiratory failure with hypoxia (HCC)   Hyperglycemia due to diabetes mellitus (HCC)   Leukocytosis   Reactive thrombocytosis   S/P AKA (above knee amputation) bilateral (HCC)   AKI (acute kidney injury) ([place])   Atrial fibrillation, chronic (HCC)   Hypomagnesemia   Elevated brain natriuretic peptide (BNP) level   Polysubstance abuse (HCC)   Noncompliance with medication regimen   Chronic diastolic CHF (congestive  heart failure) (HCC)   Hypoalbuminemia due to protein-calorie malnutrition (HCC)   GERD (gastroesophageal reflux disease)  Acute hypoxic respiratory failure: Presumed to be due to CHF and right-sided pneumonia, COPD exacerbation. CHF suspected primary component based on progressive infiltrates that quickly resolved with diuresis.  - Liberated from supplemental oxygen. Check nocturnal pulse oximetry, may need nocturnal O2. D/w RN.  NSVT: Suspected ischemic etiology per cardiology.  - Will require work up (cMRI, LHC) though renal impairment is currently prohibitive.  - Continue amiodarone loading. Continue metoprolol - Continue cardiac monitoring.  - Keep K >4, Mg >2.  PAF, multifocal atrial tachycardia: Recent admit for RVR.  - Continue metoprolol succinate, increased to 100mg . Now in sinus rhythm. - Continue eliquis for anticoagulation - Maintain K >4, Mg >2.   Acute on chronic HFrEF, HTN: LVEF 45-50% without RWMA, +LVH, normal IVC. Severe LAE, moderate RAE. BNP elevated.  - Cardiology consulted, deferring ischemic eval for now.  - Converted norvasc to bidil. Continue metoprolol succinate.   AKI with NAGMA: No abnormality on U/S, though did have trace perinephric fluid on right. Bland sediment with definite proteinuria 1.03. FENa not consistent with prerenal azotemia at 1.78%. Note history of worse AKIs previously.  - Holding diuresis as above. - Continue bicarbonate tabs  Demand myocardial ischemia: Mild troponin elevation with reassuring delta no anginal complaints.  - Defer w/u to cardiology  Right-sided CAP: Completed azithromycin x5 days. Stopped vanc w/nephrotoxicity and negative MRSA PCR. Completed 7 days of zosyn. Blood Cx's NGTD x5 days, no sputum culture obtained.  COPD with exacerbation:  - Continue inhaled BDs, avoiding steroids due to severe hyperglycemia.  Uncontrolled T2DM with steroid-induced hyperglycemia: HbA1c 7.9%.  - Insulin to continue TIDWC + SSI  novolog.  Polysubstance abuse: Previously +alcohol, tobacco, cocaine.  - Cessation counseling.  - Continue nicotine patch.   Hypocalcemia: Supplemented in setting of cardiac irritability.  - Start po supplementation.  Hypothyroidism: Repeat TSH is 0.315, last was 20.52 in September 2022 which was improved from 189 in July in setting of medication nonadherence. Free T4 is 1.12 which is the ULN.  - In setting of AFib/MAT, we did decrease synthroid dose 175mcg > 121mcg.   Anemia of chronic disease: Having a decline in hgb from admission, though all cell lines are down and hgb was above baseline on admission.  - Hemoglobin has stabilized somewhat near baseline ~9g/dl   HTN: Tx as above  PVD s/p bilateral AKA (2018-2019):  - Continue statin, anticoagulation  GERD:  - Continue PPI  Suspected malnutrition with hypoalbuminemia. - Dietitian consulted.   DVT prophylaxis: Eliquis Code Status: Full Family Communication: None at bedside Disposition Plan:  Status is: Inpatient  Remains inpatient appropriate because: requires treatment for acute CHF, AKI, and arrhythmias.  Consultants:  Cardiology Nephrology  Procedures:  Echocardiogram  Antimicrobials: Ceftriaxone, azithromycin > vancomycin, zosyn.  Subjective: Put back on oxygen due to low oxygen levels overnight. Not necessarily short of breath. No chest pain or other complaints.   Objective: Vitals:   05/01/21 1412 05/01/21 2018 05/02/21 0548 05/02/21 0932  BP:  126/72 130/72 128/69  Pulse: 72 73 72 78  Resp: 17 20 (!) 24 20  Temp: 98 F (36.7 C) 98.5 F (36.9 C) 98.2 F (36.8 C)   TempSrc: Axillary Oral Oral   SpO2: 99% 90% 95% 96%  Weight:   67.1 kg   Height:        Intake/Output Summary (Last 24 hours) at 05/02/2021 1327 Last data filed at 05/02/2021 1243 Gross per 24 hour  Intake --  Output 153 ml  Net -153 ml   Filed Weights   04/29/21 0432 04/30/21 0424 05/02/21 0548  Weight: 65.4 kg 64.8 kg 67.1 kg    Gen: Chronically ill-appearing male in no distress Pulm: Nonlabored breathing without crackles or wheezes. CV: Regular rate and rhythm. No murmur, rub, or gallop. No JVD, no swelling at thighs. GI: Abdomen soft, non-tender, non-distended, with normoactive bowel sounds.  Ext: Warm, dry bilateral AKA's. Skin: No new rashes, lesions or ulcers on visualized skin. Neuro: Alert and oriented. No focal neurological deficits. Psych: Judgement and insight appear fair. Mood euthymic & affect congruent. Behavior is appropriate.    Data Reviewed: I have personally reviewed following labs and imaging studies  CBC: Recent Labs  Lab 04/27/21 0232 04/28/21 0134 04/29/21 0433  WBC 15.8* 16.4* 14.7*  NEUTROABS 12.8*  --   --   HGB 9.0* 9.3* 9.0*  HCT 28.1* 28.2* 27.4*  MCV 87.0 85.7 86.4  PLT 571* 603* 350*   Basic Metabolic Panel: Recent Labs  Lab 04/26/21 0100 04/26/21 0920 04/27/21 0232 04/28/21 0134 04/28/21 1905 04/29/21 0433 04/30/21 0157 05/01/21 0136 05/02/21 0206  NA 140  --  138 136 137 136 136 139 139  K 3.4*  --  3.4* 3.5 3.7 4.2 4.3 4.1 3.8  CL 112*  --  109 105 105 104 107 111 109  CO2 16*  --  16* 18* 19* 19* 18* 18* 19*  GLUCOSE 104*  --  143* 149* 166* 191* 258* 173* 146*  BUN 31*  --  35* 42* 44* 43* 40* 35* 31*  CREATININE 2.19*  --  2.59* 2.89* 2.99* 2.91* 2.84* 2.62* 2.30*  CALCIUM 6.9*  --  6.7* 6.6* 6.3* 6.5* 6.4* 7.0* 6.9*  MG  --  1.8  --  2.1 1.8 2.2 2.0  --   --   PHOS 6.3*  --  6.2* 5.2*  --   --   --  3.1 3.4   GFR: Estimated Creatinine Clearance: 30 mL/min (A) (by C-G formula based on SCr of 2.3 mg/dL (H)). Liver Function Tests: Recent Labs  Lab 04/26/21 0100 04/27/21 0232 04/28/21 0134 05/01/21 0136 05/02/21 0206  ALBUMIN 1.8* 1.8* 1.9* 2.0* 1.8*   No results for input(s): LIPASE, AMYLASE in the last 168 hours. No results for input(s): AMMONIA in the last 168 hours. Coagulation Profile: No results for input(s): INR, PROTIME in the last 168  hours.  Cardiac Enzymes: No results for input(s): CKTOTAL, CKMB, CKMBINDEX, TROPONINI in the last 168 hours. BNP (last 3 results) No results for input(s): PROBNP in the last 8760 hours. HbA1C: No results for input(s): HGBA1C in the last 72 hours.  CBG: Recent Labs  Lab 05/01/21 1410 05/01/21 1737 05/01/21 2243 05/02/21 0743 05/02/21 1127  GLUCAP 125* 219* 115* 122* 184*   Lipid Profile: No results for input(s): CHOL, HDL, LDLCALC, TRIG, CHOLHDL, LDLDIRECT in the last 72 hours. Thyroid Function Tests: No results for input(s): TSH, T4TOTAL, FREET4, T3FREE, THYROIDAB in the last 72 hours.  Anemia Panel: No results for input(s): VITAMINB12, FOLATE, FERRITIN, TIBC, IRON, RETICCTPCT in the last 72 hours. Urine analysis:    Component Value Date/Time   COLORURINE YELLOW 04/24/2021 [place] 04/24/2021 0446   APPEARANCEUR Clear 02/09/2018 1352   LABSPEC 1.018 04/24/2021 0446   PHURINE 5.0 04/24/2021 0446   GLUCOSEU NEGATIVE 04/24/2021 0446   HGBUR NEGATIVE 04/24/2021 0446   BILIRUBINUR NEGATIVE 04/24/2021 0446   BILIRUBINUR Negative 02/09/2018 [place] 04/24/2021 0446   PROTEINUR 100 (A) 04/24/2021 0446   NITRITE NEGATIVE 04/24/2021 0446   LEUKOCYTESUR NEGATIVE 04/24/2021 0446   Recent Results (from the past 240 hour(s))  MRSA Next Gen by PCR, Nasal     Status: None   Collection Time: 04/22/21  4:40 PM   Specimen: Nasal Mucosa; Nasal Swab  Result Value Ref Range Status   MRSA by PCR Next Gen NOT DETECTED NOT DETECTED Final    Comment: (NOTE) The GeneXpert MRSA Assay (FDA approved for NASAL specimens only), is one component of a comprehensive MRSA colonization surveillance program. It is not intended to diagnose MRSA infection nor to guide or monitor treatment for MRSA infections. Test performance is not FDA approved in patients less than 47 years old. Performed at [facility], [place] [address], [place], [place] [address]        Radiology Studies: No results found.  Scheduled Meds:  amiodarone  400 mg Oral BID   apixaban  5 mg Oral BID   atorvastatin  40 mg Oral Daily   calcium carbonate  1,250 mg Oral QHS   folic acid  1 mg Oral Daily   insulin aspart  0-5 Units Subcutaneous QHS   insulin aspart  0-9 Units Subcutaneous TID WC   insulin aspart  3 Units Subcutaneous TID WC   isosorbide dinitrate  20 mg Oral BID   levothyroxine  100 mcg Oral QAC breakfast   metoprolol succinate  100 mg Oral Daily   multivitamin with minerals  1 tablet Oral Daily   pantoprazole  40 mg Oral BID   sodium bicarbonate  1,300 mg Oral TID   thiamine  100 mg Oral Daily  Continuous Infusions:  sodium chloride Stopped (04/20/21 0916)     LOS: 12 days   Time spent: 25 minutes.  Patrecia Pour, MD [facility] [email or web address] 05/02/2021, 1:27 PM

## 2021-05-03 DIAGNOSIS — J189 Pneumonia, unspecified organism: Secondary | ICD-10-CM | POA: Diagnosis not present

## 2021-05-03 DIAGNOSIS — N179 Acute kidney failure, unspecified: Secondary | ICD-10-CM | POA: Diagnosis not present

## 2021-05-03 DIAGNOSIS — E039 Hypothyroidism, unspecified: Secondary | ICD-10-CM | POA: Diagnosis not present

## 2021-05-03 DIAGNOSIS — J9601 Acute respiratory failure with hypoxia: Secondary | ICD-10-CM | POA: Diagnosis not present

## 2021-05-03 LAB — RENAL FUNCTION PANEL
Albumin: 1.8 g/dL — ABNORMAL LOW (ref 3.5–5.0)
Anion gap: 9 (ref 5–15)
BUN: 26 mg/dL — ABNORMAL HIGH (ref 8–23)
CO2: 20 mmol/L — ABNORMAL LOW (ref 22–32)
Calcium: 7 mg/dL — ABNORMAL LOW (ref 8.9–10.3)
Chloride: 109 mmol/L (ref 98–111)
Creatinine, Ser: 2.21 mg/dL — ABNORMAL HIGH (ref 0.61–1.24)
GFR, Estimated: 32 mL/min — ABNORMAL LOW (ref >60)
Glucose, Bld: 159 mg/dL — ABNORMAL HIGH (ref 70–99)
Phosphorus: 5.1 mg/dL — ABNORMAL HIGH (ref 2.5–4.6)
Potassium: 4 mmol/L (ref 3.5–5.1)
Sodium: 138 mmol/L (ref 135–145)

## 2021-05-03 LAB — GLUCOSE, CAPILLARY
Glucose-Capillary: 95 mg/dL (ref 70–99)
Glucose-Capillary: 79 mg/dL (ref 70–99)
Glucose-Capillary: 208 mg/dL — ABNORMAL HIGH (ref 70–99)
Glucose-Capillary: 198 mg/dL — ABNORMAL HIGH (ref 70–99)

## 2021-05-03 MED ORDER — VITAMIN D 25 MCG (1000 UNIT) PO TABS
1000.0000 [IU] | ORAL_TABLET | Freq: Every day | ORAL | Status: DC
  Administered 2021-05-03 – 2021-05-11 (×9): 1000 [IU] via ORAL
  Filled 2021-05-03 (×9): qty 1

## 2021-05-03 NOTE — TOC Progression Note (Signed)
Transition of Care [facility]) - Progression Note    Patient Details  Name: Todd [name] MRN: [id number] Date of Birth: [birth year]  Transition of Care [facility]) CM/SW [place], [place] Phone Number: 05/03/2021, 1:23 PM  Clinical Narrative:      CSW continues to follow patient until medically ready for dc. Patient has SN[facility] bed at [facility]. CSW will continue to follow and assist with patients dc planning needs.    Barriers to Discharge: Continued Medical Work up  Expected Discharge Plan and Services           Expected Discharge Date: 04/25/21                                     Social Determinants of Health (SDOH) Interventions    Readmission Risk Interventions No flowsheet data found.

## 2021-05-03 NOTE — Care Management Important Message (Signed)
Important Message  Patient Details  Name: Todd [name] MRN: [id number] Date of Birth: 01/03/1956   Medicare Important Message Given:  Yes     [name] 05/03/2021, 10:03 AM

## 2021-05-03 NOTE — Progress Notes (Signed)
Patient ID: Todd [name], male   DOB: [birth year], 65 y.o.   MRN: [id number] [facility] Progress Note   Assessment/ Plan:   1. Acute kidney Injury on chronic kidney disease stage IIIa: Borderline nonoliguric overnight with some improvement of BUN/creatinine, etiology suspected to be from hemodynamic mechanism with intravenous diuresis/arrhythmias and relative hypotension.  He does not have any clinical or lab indications for hemodialysis at this time.  Corrected calcium for his low albumin is within normal range.  Agree with continued monitoring of labs/urine output for ongoing renal recovery off Lasix. 2.  Acute hypoxic respiratory failure: Secondary to pneumonia and congestive heart failure-on diuretics transiently held after developing worsening renal function and he is status post azithromycin and Zosyn. 3.  Anion gap metabolic acidosis: Secondary to acute kidney injury, currently on sodium bicarbonate supplementation with acceptable bicarbonate levels. 4.  Anemia: Likely secondary to acute/critical illness, will continue to follow trend to decide on need for PRBC transfusion versus additional evaluation 5.  History of polysubstance abuse  Subjective:   Complains of some intermittent shortness of breath without any chest pain.   Objective:   BP 123/72 (BP Location: Left Arm)   Pulse 67   Temp 98.4 F (36.9 C) (Oral)   Resp 16   Ht 5\' 8"  (1.727 m)   Wt 66.1 kg   SpO2 97%   BMI 22.16 kg/m   Intake/Output Summary (Last 24 hours) at 05/03/2021 0959 Last data filed at 05/03/2021 0600 Gross per 24 hour  Intake 120 ml  Output 301 ml  Net -181 ml   Weight change: -1.033 kg  Physical Exam: Gen: Appears comfortable resting in bed, on oxygen via nasal cannula CVS: Pulse regular rhythm, normal rate, S1 and S2 normal Resp: Clear to auscultation bilaterally, no distinct rales or rhonchi Abd: Soft, flat, nontender Ext: Status post bilateral AKA with right stump having  Mepilex dressing.  No edema  Imaging: No results found.  Labs: BMET Recent Labs  Lab 04/27/21 0232 04/28/21 0134 04/28/21 1905 04/29/21 0433 04/30/21 0157 05/01/21 0136 05/02/21 0206 05/03/21 0100  NA 138 136 137 136 136 139 139 138  K 3.4* 3.5 3.7 4.2 4.3 4.1 3.8 4.0  CL 109 105 105 104 107 111 109 109  CO2 16* 18* 19* 19* 18* 18* 19* 20*  GLUCOSE 143* 149* 166* 191* 258* 173* 146* 159*  BUN 35* 42* 44* 43* 40* 35* 31* 26*  CREATININE 2.59* 2.89* 2.99* 2.91* 2.84* 2.62* 2.30* 2.21*  CALCIUM 6.7* 6.6* 6.3* 6.5* 6.4* 7.0* 6.9* 7.0*  PHOS 6.2* 5.2*  --   --   --  3.1 3.4 5.1*   CBC Recent Labs  Lab 04/27/21 0232 04/28/21 0134 04/29/21 0433  WBC 15.8* 16.4* 14.7*  NEUTROABS 12.8*  --   --   HGB 9.0* 9.3* 9.0*  HCT 28.1* 28.2* 27.4*  MCV 87.0 85.7 86.4  PLT 571* 603* 617*    Medications:     amiodarone  400 mg Oral BID   apixaban  5 mg Oral BID   atorvastatin  40 mg Oral Daily   calcium carbonate  1,250 mg Oral QHS   folic acid  1 mg Oral Daily   insulin aspart  0-5 Units Subcutaneous QHS   insulin aspart  0-9 Units Subcutaneous TID WC   insulin aspart  3 Units Subcutaneous TID WC   isosorbide dinitrate  20 mg Oral BID   levothyroxine  100 mcg Oral QAC breakfast   metoprolol succinate  100 mg Oral Daily   multivitamin with minerals  1 tablet Oral Daily   pantoprazole  40 mg Oral BID   sertraline  50 mg Oral QHS   sodium bicarbonate  1,300 mg Oral TID   thiamine  100 mg Oral Daily   Elmarie Shiley, MD 05/03/2021, 9:59 AM

## 2021-05-03 NOTE — Progress Notes (Signed)
Progress Note  Patient Name: Todd [name] Date of Encounter: 05/03/2021  Primary Cardiologist: Minus Breeding, MD   Subjective   Breathing is OK  No CP    Inpatient Medications    Scheduled Meds:  amiodarone  400 mg Oral BID   apixaban  5 mg Oral BID   atorvastatin  40 mg Oral Daily   calcium carbonate  1,250 mg Oral QHS   folic acid  1 mg Oral Daily   insulin aspart  0-5 Units Subcutaneous QHS   insulin aspart  0-9 Units Subcutaneous TID WC   insulin aspart  3 Units Subcutaneous TID WC   isosorbide dinitrate  20 mg Oral BID   levothyroxine  100 mcg Oral QAC breakfast   metoprolol succinate  100 mg Oral Daily   multivitamin with minerals  1 tablet Oral Daily   pantoprazole  40 mg Oral BID   sertraline  50 mg Oral QHS   sodium bicarbonate  1,300 mg Oral TID   thiamine  100 mg Oral Daily   Continuous Infusions:  sodium chloride Stopped (04/20/21 0916)   PRN Meds: sodium chloride, acetaminophen, ipratropium-albuterol, nicotine   Vital Signs    Vitals:   05/02/21 2013 05/02/21 2200 05/03/21 0438 05/03/21 0813  BP: 129/76  122/78 123/72  Pulse: 73  70 67  Resp: 16 (!) 21 16 16   Temp: 98.4 F (36.9 C)  98.2 F (36.8 C) 98.4 F (36.9 C)  TempSrc: Oral  Oral Oral  SpO2: 98% 100% 92% 97%  Weight:   66.1 kg   Height:        Intake/Output Summary (Last 24 hours) at 05/03/2021 0911 Last data filed at 05/03/2021 0600 Gross per 24 hour  Intake 120 ml  Output 451 ml  Net -331 ml   Filed Weights   04/30/21 0424 05/02/21 0548 05/03/21 0438  Weight: 64.8 kg 67.1 kg 66.1 kg    Telemetry    Sinus rhythm  -personally reviewed  ECG    None new  Physical Exam   GEN: Pt is in no acute distress  HEENT: normal  Neck: no JVD, carotid bruits Cardiac: RRR; no murmurs  ,no LE  edema  Respiratory:  clear to auscultation bilaterally,  GI: soft, nontender, nondistended, + BS MS: no deformity or atrophy  Skin: warm and dry, bilateral AKA  no edema in thighs   Neuro:  Strength and sensation are intact Psych: euthymic mood, full affect   Labs    Chemistry Recent Labs  Lab 05/01/21 0136 05/02/21 0206 05/03/21 0100  NA 139 139 138  K 4.1 3.8 4.0  CL 111 109 109  CO2 18* 19* 20*  GLUCOSE 173* 146* 159*  BUN 35* 31* 26*  CREATININE 2.62* 2.30* 2.21*  CALCIUM 7.0* 6.9* 7.0*  ALBUMIN 2.0* 1.8* 1.8*  GFRNONAA 26* 31* 32*  ANIONGAP 10 11 9      Hematology Recent Labs  Lab 04/27/21 0232 04/28/21 0134 04/29/21 0433  WBC 15.8* 16.4* 14.7*  RBC 3.23* 3.29* 3.17*  HGB 9.0* 9.3* 9.0*  HCT 28.1* 28.2* 27.4*  MCV 87.0 85.7 86.4  MCH 27.9 28.3 28.4  MCHC 32.0 33.0 32.8  RDW 15.7* 15.4 15.4  PLT 571* 603* 617*    Cardiac EnzymesNo results for input(s): TROPONINI in the last 168 hours. No results for input(s): TROPIPOC in the last 168 hours.   BNPNo results for input(s): BNP, PROBNP in the last 168 hours.   DDimer No results for input(s): DDIMER in  the last 168 hours.   Radiology    No results found.  Cardiac Studies  Echo 04/20/21  1. Left ventricular ejection fraction, by estimation, is 45 to 50%. The  left ventricle has mildly decreased function. There is moderate left  ventricular hypertrophy.   2. Right ventricular systolic function is normal. The right ventricular  size is normal.   3. Left atrial size was severely dilated.   4. Right atrial size was moderately dilated.   5. The inferior vena cava is normal in size with greater than 50%  respiratory variability, suggesting right atrial pressure of 3 mmHg.   6. Limited echo evaluate LV function.   Patient Profile     65 y.o. male presumed CAD, mild cardiomyopathy with normalization of EF on Echo in 12/2020, paroxysmal atrial fibrillation on Eliquis, PAD s/p bilateral AKA in 2018, hypertension, hyperlipidemia, type 2 diabetes mellitus, hypothyroidism, CKD stage III, polysubstance abuse (cocaine, alcohol, tobacco), and poor compliance who was admitted on 04/19/2021 with  acute hypoxic respiratory failure secondary to community acquired pneumonia and CHF.   Assessment & Plan   Principal Problem:   CAP (community acquired pneumonia) Active Problems:   Essential hypertension   Acquired hypothyroidism   Elevated troponin   Acute respiratory failure with hypoxia (HCC)   Hyperglycemia due to diabetes mellitus (HCC)   Leukocytosis   Reactive thrombocytosis   S/P AKA (above knee amputation) bilateral (HCC)   AKI (acute kidney injury) ([place])   Atrial fibrillation, chronic (HCC)   Hypomagnesemia   Elevated brain natriuretic peptide (BNP) level   Polysubstance abuse (HCC)   Noncompliance with medication regimen   Chronic diastolic CHF (congestive heart failure) (HCC)   Hypoalbuminemia due to protein-calorie malnutrition (HCC)   GERD (gastroesophageal reflux disease)   1.  Acute on chronic systolic heart failure with mildly reduced ejection fraction\ Diuretics are on hold with elevated Cr     Continue toprol Will not add other agents, dont want to drop BP and negatively effect renal function.  2.  Nonsustained ventricular tachycardia Currently on amiodarone.  VT has improved with current loading.  He will likely need an ischemic evaluation, though with his kidney function we will hold off.  Continue Toprol-XL.   2.  Elevated troponin with presumed coronary artery disease: Ejection fraction 45%.  No current chest pain.  Likely need an ischemic evaluation if kidney function improves.  3.  Paroxysmal atrial fibrillation: Maintaining sinus rhythm on amiodarone.  Continue Eliquis and Toprol-XL.  4.  Peripheral arterial disease: Status post bilateral AKA in 2018.  Continue statin.  5.  Hypertension: Blood pressure well controlled.on current meds    6.  Hyperlipidemia: Continue Crestor 40 mg  7.  Acute on chronic CKD stage III:ve. Cr is improving   Renal following   Holding diuretics       For questions or updates, please contact [place] Please  consult [email or web address] for contact info under        Signed, Dorris Carnes, MD  05/03/2021, 9:11 AM

## 2021-05-03 NOTE — Progress Notes (Signed)
PROGRESS NOTE  Todd [name]  ATF:[id number] DOB: [birth year] DOA: 04/19/2021 PCP: [name], FNP  Brief Narrative: Todd [name] is a 65 y.o. male recently admitted from 01/10/2021 to 01/15/2021 due to chronic A. fib with RVR, with medical history significant for essential hypertension, hypothyroidism, type 2 diabetes mellitus, chronic atrial fibrillation, status post bilateral AKA, history of polysubstance abuse (alcohol, tobacco and cocaine) who presents to [facility] ED from [facility] via EMS due to shortness of breath.  He was noted to be hypoxic, in the 80% on room air at the SNF, neb treatment was provided and patient was placed on a supplemental oxygen via [place] at 2 LPM with minimal improvement.  EMS was activated and on arrival, O2 supplementation was increased to 4 LPM en route to the hospital.   Work-up revealed acute hypoxic respiratory failure, right-sided community-acquired pneumonia, mild pulmonary edema, elevated troponin, elevated BNP greater than 800, acute on chronic diastolic CHF, AKI, non-anion gap metabolic acidosis, and electrolyte abnormalities.  Echocardiogram revealed LV systolic dysfunction and monitoring detected RVR. Lasix was given with subsequently increasing creatinine. Diuresis pushed despite renal impairment due to progressive pulmonary edema on CXR with hypoxia.   Assessment & Plan: Principal Problem:   CAP (community acquired pneumonia) Active Problems:   Essential hypertension   Acquired hypothyroidism   Elevated troponin   Acute respiratory failure with hypoxia (HCC)   Hyperglycemia due to diabetes mellitus (HCC)   Leukocytosis   Reactive thrombocytosis   S/P AKA (above knee amputation) bilateral (HCC)   AKI (acute kidney injury) ([place])   Atrial fibrillation, chronic (HCC)   Hypomagnesemia   Elevated brain natriuretic peptide (BNP) level   Polysubstance abuse (HCC)   Noncompliance with medication regimen   Chronic diastolic CHF (congestive  heart failure) (HCC)   Hypoalbuminemia due to protein-calorie malnutrition (HCC)   GERD (gastroesophageal reflux disease)  Acute hypoxic respiratory failure: Presumed to be due to CHF and right-sided pneumonia, COPD exacerbation. CHF suspected primary component based on progressive infiltrates that quickly resolved with diuresis.  - Hypoxia seems to recur when sleeping, so possibly an element of OSA. Clubbing also noted suggestive of a more long term possibly occult hypoxemia. May need Supplemental oxygen at discharge.   NSVT: Suspected ischemic etiology per cardiology.  - Will require work up (cMRI, LHC) though renal impairment is currently prohibitive.  - Continue amiodarone loading. Continue metoprolol - Continue cardiac monitoring.  - Keep K >4, Mg >2. Recheck daily.   PAF, multifocal atrial tachycardia: Recent admit for RVR.  - Continue metoprolol succinate, increased to 100mg . Now in sinus rhythm for the most part. - Continue eliquis for anticoagulation - Maintain K >4, Mg >2.   Acute on chronic HFrEF, HTN: LVEF 45-50% without RWMA, +LVH, normal IVC. Severe LAE, moderate RAE. BNP elevated.  - Cardiology consulted, deferring ischemic eval for now. If renal impairment continues to improve, may pursue work up as inpatient. - Converted norvasc to bidil. Continue metoprolol succinate.   AKI with NAGMA: No abnormality on U/S, though did have trace perinephric fluid on right. Bland sediment with definite proteinuria 1.03. FENa not consistent with prerenal azotemia at 1.78%. Note history of worse AKIs previously.  - Holding diuresis as above. Monitoring metabolic panel daily, Cr has been improving since 11/16. - Continue bicarbonate tabs  Demand myocardial ischemia: Mild troponin elevation with reassuring delta no anginal complaints.  - Defer w/u to cardiology  Right-sided CAP: Completed azithromycin x5 days. Stopped vanc w/nephrotoxicity and negative MRSA PCR. Completed  7 days of zosyn.  Blood Cx's NGTD x5 days, no sputum culture obtained.  COPD with exacerbation:  - Continue inhaled BDs, avoiding steroids due to severe hyperglycemia.  Uncontrolled T2DM with steroid-induced hyperglycemia: HbA1c 7.9%.  - Insulin to continue TIDWC + SSI novolog.  Polysubstance abuse: Previously +alcohol, tobacco, cocaine.  - Cessation counseling.  - Continue nicotine patch.   Depression:  - Restart SSRI. Recommend pursuing outpatient therapy. No indication for inpatient psychiatry consultation at this time.  Hypocalcemia:  - Continue supplementation. Corrected value (albumin 1.8) = 8.8. - Vitamin D 29.7. Will supplement.  Hypothyroidism: Repeat TSH is 0.315, last was 20.52 in September 2022 which was improved from 189 in July in setting of medication nonadherence. Free T4 is 1.12 which is the ULN.  - In setting of AFib/MAT, we did decrease synthroid dose 149mcg > 184mcg.   Anemia of chronic disease: Having a decline in hgb from admission, though all cell lines are down and hgb was above baseline on admission.  - Hemoglobin has stabilized somewhat near baseline ~9g/dl   HTN: Tx as above  PVD s/p bilateral AKA (2018-2019):  - Continue statin, anticoagulation  GERD:  - Continue PPI  Suspected malnutrition with hypoalbuminemia. - Dietitian consulted.   DVT prophylaxis: Eliquis Code Status: Full Family Communication: Sisters at bedside 11/20. Disposition Plan:  Status is: Inpatient  Remains inpatient appropriate because: requires treatment for acute CHF, AKI, and arrhythmias.  Consultants:  Cardiology Nephrology  Procedures:  Echocardiogram  Antimicrobials: Ceftriaxone, azithromycin > vancomycin, zosyn.  Subjective: Breathing is about the same, continues to have low oxygen levels when sleeping. No chest pain. No sensation of palpitations. Had tearful episode yesterday but denies any sadness, hopelessness today. No SI.   Objective: Vitals:   05/02/21 2013 05/02/21  2200 05/03/21 0438 05/03/21 0813  BP: 129/76  122/78 123/72  Pulse: 73  70 67  Resp: 16 (!) 21 16 16   Temp: 98.4 F (36.9 C)  98.2 F (36.8 C) 98.4 F (36.9 C)  TempSrc: Oral  Oral Oral  SpO2: 98% 100% 92% 97%  Weight:   66.1 kg   Height:        Intake/Output Summary (Last 24 hours) at 05/03/2021 1352 Last data filed at 05/03/2021 0600 Gross per 24 hour  Intake 120 ml  Output 300 ml  Net -180 ml   Filed Weights   04/30/21 0424 05/02/21 0548 05/03/21 0438  Weight: 64.8 kg 67.1 kg 66.1 kg   Gen: 65 y.o. male in no distress Pulm: Nonlabored breathing supplemental oxygen, no crackles anterolaterally. CV: Regular with occasional PVC. No murmur, rub, or gallop. No JVD, no definite dependent edema. GI: Abdomen soft, non-tender, non-distended, with normoactive bowel sounds.  Ext: Warm, dry bilateral AKAs Skin: No new rashes, lesions or ulcers on visualized skin. Neuro: Alert and oriented. No focal neurological deficits. Psych: Judgement and insight appear fair. Mood currently stable, poor eye contact but remains interactive with responsive affect. No SI, AVH, or internal stimuli.     Data Reviewed: I have personally reviewed following labs and imaging studies  CBC: Recent Labs  Lab 04/27/21 0232 04/28/21 0134 04/29/21 0433  WBC 15.8* 16.4* 14.7*  NEUTROABS 12.8*  --   --   HGB 9.0* 9.3* 9.0*  HCT 28.1* 28.2* 27.4*  MCV 87.0 85.7 86.4  PLT 571* 603* 063*   Basic Metabolic Panel: Recent Labs  Lab 04/27/21 0232 04/28/21 0134 04/28/21 1905 04/29/21 0433 04/30/21 0157 05/01/21 0136 05/02/21 0206 05/03/21 0100  NA  138 136 137 136 136 139 139 138  K 3.4* 3.5 3.7 4.2 4.3 4.1 3.8 4.0  CL 109 105 105 104 107 111 109 109  CO2 16* 18* 19* 19* 18* 18* 19* 20*  GLUCOSE 143* 149* 166* 191* 258* 173* 146* 159*  BUN 35* 42* 44* 43* 40* 35* 31* 26*  CREATININE 2.59* 2.89* 2.99* 2.91* 2.84* 2.62* 2.30* 2.21*  CALCIUM 6.7* 6.6* 6.3* 6.5* 6.4* 7.0* 6.9* 7.0*  MG  --  2.1 1.8  2.2 2.0  --   --   --   PHOS 6.2* 5.2*  --   --   --  3.1 3.4 5.1*   GFR: Estimated Creatinine Clearance: 30.7 mL/min (A) (by C-G formula based on SCr of 2.21 mg/dL (H)). Liver Function Tests: Recent Labs  Lab 04/27/21 0232 04/28/21 0134 05/01/21 0136 05/02/21 0206 05/03/21 0100  ALBUMIN 1.8* 1.9* 2.0* 1.8* 1.8*   No results for input(s): LIPASE, AMYLASE in the last 168 hours. No results for input(s): AMMONIA in the last 168 hours. Coagulation Profile: No results for input(s): INR, PROTIME in the last 168 hours.  Cardiac Enzymes: No results for input(s): CKTOTAL, CKMB, CKMBINDEX, TROPONINI in the last 168 hours. BNP (last 3 results) No results for input(s): PROBNP in the last 8760 hours. HbA1C: No results for input(s): HGBA1C in the last 72 hours.  CBG: Recent Labs  Lab 05/02/21 1127 05/02/21 1702 05/02/21 2123 05/03/21 0738 05/03/21 1129  GLUCAP 184* 141* 83 95 79   Lipid Profile: No results for input(s): CHOL, HDL, LDLCALC, TRIG, CHOLHDL, LDLDIRECT in the last 72 hours. Thyroid Function Tests: No results for input(s): TSH, T4TOTAL, FREET4, T3FREE, THYROIDAB in the last 72 hours.  Anemia Panel: No results for input(s): VITAMINB12, FOLATE, FERRITIN, TIBC, IRON, RETICCTPCT in the last 72 hours. Urine analysis:    Component Value Date/Time   COLORURINE YELLOW 04/24/2021 [place] 04/24/2021 0446   APPEARANCEUR Clear 02/09/2018 1352   LABSPEC 1.018 04/24/2021 0446   PHURINE 5.0 04/24/2021 0446   GLUCOSEU NEGATIVE 04/24/2021 0446   HGBUR NEGATIVE 04/24/2021 0446   BILIRUBINUR NEGATIVE 04/24/2021 0446   BILIRUBINUR Negative 02/09/2018 [place] 04/24/2021 0446   PROTEINUR 100 (A) 04/24/2021 0446   NITRITE NEGATIVE 04/24/2021 0446   LEUKOCYTESUR NEGATIVE 04/24/2021 0446   No results found for this or any previous visit (from the past 240 hour(s)).     Radiology Studies: No results found.  Scheduled Meds:  amiodarone  400 mg  Oral BID   apixaban  5 mg Oral BID   atorvastatin  40 mg Oral Daily   calcium carbonate  1,250 mg Oral QHS   folic acid  1 mg Oral Daily   insulin aspart  0-5 Units Subcutaneous QHS   insulin aspart  0-9 Units Subcutaneous TID WC   insulin aspart  3 Units Subcutaneous TID WC   isosorbide dinitrate  20 mg Oral BID   levothyroxine  100 mcg Oral QAC breakfast   metoprolol succinate  100 mg Oral Daily   multivitamin with minerals  1 tablet Oral Daily   pantoprazole  40 mg Oral BID   sertraline  50 mg Oral QHS   sodium bicarbonate  1,300 mg Oral TID   thiamine  100 mg Oral Daily   Continuous Infusions:  sodium chloride Stopped (04/20/21 0916)     LOS: 13 days   Time spent: 25 minutes.  Patrecia Pour, MD [facility] [email or web address] 05/03/2021, 1:52 PM

## 2021-05-04 DIAGNOSIS — E039 Hypothyroidism, unspecified: Secondary | ICD-10-CM | POA: Diagnosis not present

## 2021-05-04 DIAGNOSIS — J189 Pneumonia, unspecified organism: Secondary | ICD-10-CM | POA: Diagnosis not present

## 2021-05-04 DIAGNOSIS — N179 Acute kidney failure, unspecified: Secondary | ICD-10-CM | POA: Diagnosis not present

## 2021-05-04 DIAGNOSIS — J9601 Acute respiratory failure with hypoxia: Secondary | ICD-10-CM | POA: Diagnosis not present

## 2021-05-04 LAB — CBC WITH DIFFERENTIAL/PLATELET
Abs Immature Granulocytes: 0.4 10*3/uL — ABNORMAL HIGH (ref 0.00–0.07)
Basophils Absolute: 0.1 10*3/uL (ref 0.0–0.1)
Basophils Relative: 1 %
Eosinophils Absolute: 0.2 10*3/uL (ref 0.0–0.5)
Eosinophils Relative: 2 %
HCT: 27.6 % — ABNORMAL LOW (ref 39.0–52.0)
Hemoglobin: 8.7 g/dL — ABNORMAL LOW (ref 13.0–17.0)
Immature Granulocytes: 3 %
Lymphocytes Relative: 17 %
Lymphs Abs: 2.5 10*3/uL (ref 0.7–4.0)
MCH: 27.8 pg (ref 26.0–34.0)
MCHC: 31.5 g/dL (ref 30.0–36.0)
MCV: 88.2 fL (ref 80.0–100.0)
Monocytes Absolute: 0.7 10*3/uL (ref 0.1–1.0)
Monocytes Relative: 5 %
Neutro Abs: 10.7 10*3/uL — ABNORMAL HIGH (ref 1.7–7.7)
Neutrophils Relative %: 72 %
Platelets: 482 10*3/uL — ABNORMAL HIGH (ref 150–400)
RBC: 3.13 MIL/uL — ABNORMAL LOW (ref 4.22–5.81)
RDW: 15.3 % (ref 11.5–15.5)
WBC: 14.6 10*3/uL — ABNORMAL HIGH (ref 4.0–10.5)
nRBC: 0 % (ref 0.0–0.2)

## 2021-05-04 LAB — RENAL FUNCTION PANEL
Albumin: 1.8 g/dL — ABNORMAL LOW (ref 3.5–5.0)
Anion gap: 12 (ref 5–15)
BUN: 25 mg/dL — ABNORMAL HIGH (ref 8–23)
CO2: 18 mmol/L — ABNORMAL LOW (ref 22–32)
Calcium: 6.7 mg/dL — ABNORMAL LOW (ref 8.9–10.3)
Chloride: 110 mmol/L (ref 98–111)
Creatinine, Ser: 2.14 mg/dL — ABNORMAL HIGH (ref 0.61–1.24)
GFR, Estimated: 33 mL/min — ABNORMAL LOW (ref >60)
Glucose, Bld: 174 mg/dL — ABNORMAL HIGH (ref 70–99)
Phosphorus: 5.1 mg/dL — ABNORMAL HIGH (ref 2.5–4.6)
Potassium: 3.5 mmol/L (ref 3.5–5.1)
Sodium: 140 mmol/L (ref 135–145)

## 2021-05-04 LAB — GLUCOSE, CAPILLARY
Glucose-Capillary: 145 mg/dL — ABNORMAL HIGH (ref 70–99)
Glucose-Capillary: 101 mg/dL — ABNORMAL HIGH (ref 70–99)
Glucose-Capillary: 92 mg/dL (ref 70–99)
Glucose-Capillary: 121 mg/dL — ABNORMAL HIGH (ref 70–99)

## 2021-05-04 LAB — MAGNESIUM: Magnesium: 1.8 mg/dL (ref 1.7–2.4)

## 2021-05-04 MED ORDER — GLUCERNA SHAKE PO LIQD
237.0000 mL | Freq: Three times a day (TID) | ORAL | Status: DC
  Administered 2021-05-07 – 2021-05-09 (×2): 237 mL via ORAL

## 2021-05-04 NOTE — Progress Notes (Signed)
Patient ID: Todd [name], male   DOB: [birth year], 65 y.o.   MRN: [id number] [facility] Progress Note   Assessment/ Plan:   1. Acute kidney Injury on chronic kidney disease stage IIIa: Urine output not charted from overnight with continued but slow improvement of BUN/creatinine, etiology suspected to be hemodynamic with intravenous diuresis/arrhythmias and relative hypotension. Corrected calcium for his low albumin is within normal range.  Based on his current exam, there is no compelling indication to restart diuretics.  He will need renal follow-up after discharge. 2.  Acute hypoxic respiratory failure: Secondary to pneumonia and congestive heart failure-on diuretics transiently held after developing worsening renal function and he is status post azithromycin and Zosyn. 3.  Anion gap metabolic acidosis: Secondary to acute kidney injury, currently on sodium bicarbonate supplementation with acceptable bicarbonate levels. 4.  Anemia: Likely secondary to acute/critical illness, will continue to follow trend to decide on need for PRBC transfusion versus additional evaluation 5.  History of polysubstance abuse  Subjective:   Feeling a little frustrated this morning with still being in the hospital.  Has had some intermittent shortness of breath overnight.   Objective:   BP 121/61 (BP Location: Left Arm)   Pulse 65   Temp 98.6 F (37 C) (Oral)   Resp 17   Ht 5\' 8"  (1.727 m)   Wt 66.5 kg   SpO2 100%   BMI 22.29 kg/m  No intake or output data in the 24 hours ending 05/04/21 0803  Weight change: 0.4 kg  Physical Exam: Gen: Appears comfortable resting in bed, on oxygen via nasal cannula CVS: Pulse regular rhythm, normal rate, S1 and S2 normal Resp: Clear to auscultation bilaterally, no distinct rales or rhonchi Abd: Soft, flat, nontender Ext: Status post bilateral AKA with protective dressing over right stump.  No edema  Imaging: No results found.  Labs: BMET Recent  Labs  Lab 04/28/21 0134 04/28/21 1905 04/29/21 0433 04/30/21 0157 05/01/21 0136 05/02/21 0206 05/03/21 0100 05/04/21 0204  NA 136 137 136 136 139 139 138 140  K 3.5 3.7 4.2 4.3 4.1 3.8 4.0 3.5  CL 105 105 104 107 111 109 109 110  CO2 18* 19* 19* 18* 18* 19* 20* 18*  GLUCOSE 149* 166* 191* 258* 173* 146* 159* 174*  BUN 42* 44* 43* 40* 35* 31* 26* 25*  CREATININE 2.89* 2.99* 2.91* 2.84* 2.62* 2.30* 2.21* 2.14*  CALCIUM 6.6* 6.3* 6.5* 6.4* 7.0* 6.9* 7.0* 6.7*  PHOS 5.2*  --   --   --  3.1 3.4 5.1* 5.1*   CBC Recent Labs  Lab 04/28/21 0134 04/29/21 0433 05/04/21 0204  WBC 16.4* 14.7* 14.6*  NEUTROABS  --   --  10.7*  HGB 9.3* 9.0* 8.7*  HCT 28.2* 27.4* 27.6*  MCV 85.7 86.4 88.2  PLT 603* 617* 482*    Medications:     amiodarone  400 mg Oral BID   apixaban  5 mg Oral BID   atorvastatin  40 mg Oral Daily   calcium carbonate  1,250 mg Oral QHS   cholecalciferol  1,000 Units Oral Daily   folic acid  1 mg Oral Daily   insulin aspart  0-5 Units Subcutaneous QHS   insulin aspart  0-9 Units Subcutaneous TID WC   insulin aspart  3 Units Subcutaneous TID WC   isosorbide dinitrate  20 mg Oral BID   levothyroxine  100 mcg Oral QAC breakfast   metoprolol succinate  100 mg Oral Daily   multivitamin  with minerals  1 tablet Oral Daily   pantoprazole  40 mg Oral BID   sertraline  50 mg Oral QHS   sodium bicarbonate  1,300 mg Oral TID   thiamine  100 mg Oral Daily   Elmarie Shiley, MD 05/04/2021, 8:03 AM

## 2021-05-04 NOTE — Progress Notes (Signed)
PROGRESS NOTE  Todd [name]  ELF:[id number] DOB: [birth year] DOA: 04/19/2021 PCP: [name], FNP  Brief Narrative: Todd [name] is a 65 y.o. male recently admitted from 01/10/2021 to 01/15/2021 due to chronic A. fib with RVR, with medical history significant for essential hypertension, hypothyroidism, type 2 diabetes mellitus, chronic atrial fibrillation, status post bilateral AKA, history of polysubstance abuse (alcohol, tobacco and cocaine) who presents to [facility] ED from [facility] via EMS due to shortness of breath.  He was noted to be hypoxic, in the 80% on room air at the SNF, neb treatment was provided and patient was placed on a supplemental oxygen via [place] at 2 LPM with minimal improvement.  EMS was activated and on arrival, O2 supplementation was increased to 4 LPM en route to the hospital.   Work-up revealed acute hypoxic respiratory failure, right-sided community-acquired pneumonia, mild pulmonary edema, elevated troponin, elevated BNP greater than 800, acute on chronic diastolic CHF, AKI, non-anion gap metabolic acidosis, and electrolyte abnormalities.  Echocardiogram revealed LV systolic dysfunction and monitoring detected RVR. Lasix was given with subsequently increasing creatinine. Diuresis pushed despite renal impairment due to progressive pulmonary edema on CXR with hypoxia.   Assessment & Plan: Principal Problem:   CAP (community acquired pneumonia) Active Problems:   Essential hypertension   Acquired hypothyroidism   Elevated troponin   Acute respiratory failure with hypoxia (HCC)   Hyperglycemia due to diabetes mellitus (HCC)   Leukocytosis   Reactive thrombocytosis   S/P AKA (above knee amputation) bilateral (HCC)   AKI (acute kidney injury) ([place])   Atrial fibrillation, chronic (HCC)   Hypomagnesemia   Elevated brain natriuretic peptide (BNP) level   Polysubstance abuse (HCC)   Noncompliance with medication regimen   Chronic diastolic CHF (congestive  heart failure) (HCC)   Hypoalbuminemia due to protein-calorie malnutrition (HCC)   GERD (gastroesophageal reflux disease)  Acute hypoxic respiratory failure: Presumed to be due to CHF and right-sided pneumonia, COPD exacerbation. CHF suspected primary component based on progressive infiltrates that quickly resolved with diuresis.  - Hypoxia seems to recur when sleeping, so possibly an element of OSA. Clubbing also noted suggestive of a more long term possibly occult hypoxemia. May need supplemental oxygen at discharge.   NSVT: Suspected ischemic etiology per cardiology.  - Will require work up (cMRI, LHC) though renal impairment is currently prohibitive.  - Continue amiodarone loading. Continue metoprolol - Continue cardiac monitoring.  - Keep K >4, Mg >2. Recheck daily.   PAF, multifocal atrial tachycardia: Recent admit for RVR.  - Continue metoprolol succinate, increased to 100mg . Now in sinus rhythm for the most part. - Continue eliquis for anticoagulation - Maintain K >4, Mg >2.   Acute on chronic HFrEF, HTN: LVEF 45-50% without RWMA, +LVH, normal IVC. Severe LAE, moderate RAE. BNP elevated.  - Cardiology consulted, deferring ischemic eval for now. If renal impairment continues to improve, may pursue work up as inpatient. They are inclined to defer to outpatient setting.  - Converted norvasc to bidil. Continue metoprolol succinate.   AKI with NAGMA: No abnormality on U/S, though did have trace perinephric fluid on right. Bland sediment with definite proteinuria 1.03. FENa not consistent with prerenal azotemia at 1.78%. Note history of worse AKIs previously.  - Holding diuresis as above. Monitoring metabolic panel daily, Cr has continued slow improvement since 11/16. - Continue bicarbonate tabs. Appreciate nephrology guidance.  Demand myocardial ischemia: Mild troponin elevation with reassuring delta no anginal complaints.  - Defer w/u to cardiology  Right-sided  CAP: Completed  azithromycin x5 days. Stopped vanc w/nephrotoxicity and negative MRSA PCR. Completed 7 days of zosyn. Blood Cx's NGTD x5 days, no sputum culture obtained.  COPD with exacerbation:  - Continue inhaled BDs, avoiding steroids due to severe hyperglycemia. Exacerbation has resolved.  Uncontrolled T2DM with steroid-induced hyperglycemia: HbA1c 7.9%.  - Insulin to continue TIDWC + SSI novolog.  Polysubstance abuse: Previously +alcohol, tobacco, cocaine.  - Cessation counseling.  - Continue nicotine patch.   Depression:  - Restarted SSRI. Recommend pursuing outpatient therapy. No indication for inpatient psychiatry consultation at this time.  Hypocalcemia:  - Continue supplementation. Corrected value (albumin 1.8) = 8.8. - Vitamin D 29.7. Supplement.  Hypothyroidism: Repeat TSH is 0.315, last was 20.52 in September 2022 which was improved from 189 in July in setting of medication nonadherence. Free T4 is 1.12 which is the ULN.  - In setting of AFib/MAT, we did decrease synthroid dose 171mcg > 187mcg.   Anemia of chronic disease: Having a decline in hgb from admission, though all cell lines are down and hgb was above baseline on admission.  - Hemoglobin has stabilized somewhat near baseline ~9g/dl   HTN: Tx as above  PVD s/p bilateral AKA (2018-2019):  - Continue statin, anticoagulation  GERD:  - Continue PPI  Suspected malnutrition with hypoalbuminemia. - Dietitian consulted.   DVT prophylaxis: Eliquis Code Status: Full Family Communication: Sister at bedside and by speaker phone. Disposition Plan:  Status is: Inpatient  Remains inpatient appropriate because: requires treatment for acute CHF, AKI, and arrhythmias. Should be optimized for discharge with plans for outpatient cardiology follow up on 11/23.   Consultants:  Cardiology Nephrology  Procedures:  Echocardiogram  Antimicrobials: Ceftriaxone, azithromycin > vancomycin, zosyn.  Subjective: Breathing is stable and  improved. No chest pain. Eating well.   Objective: Vitals:   05/04/21 0500 05/04/21 0528 05/04/21 0828 05/04/21 1334  BP:  121/61 130/62 125/68  Pulse:  65 71 69  Resp:  17  (!) 23  Temp:  98.6 F (37 C)    TempSrc:  Oral    SpO2:  100%  98%  Weight: 66.5 kg     Height:       No intake or output data in the 24 hours ending 05/04/21 1756  Filed Weights   05/02/21 0548 05/03/21 0438 05/04/21 0500  Weight: 67.1 kg 66.1 kg 66.5 kg   Gen: 65 y.o. male in no distress Pulm: Nonlabored breathing room air. Clear. CV: Regular rate and rhythm with occasional PVC. No murmur, rub, or gallop. No JVD, no dependent edema. GI: Abdomen soft, non-tender, non-distended, with normoactive bowel sounds.  Ext: Warm, dry, BL AKA Skin: No new rashes, lesions or ulcers on visualized skin. Neuro: Alert and oriented. No focal neurological deficits. Psych: Judgement and insight appear fair. Mood euthymic & affect congruent. Behavior is appropriate.    Data Reviewed: I have personally reviewed following labs and imaging studies  CBC: Recent Labs  Lab 04/28/21 0134 04/29/21 0433 05/04/21 0204  WBC 16.4* 14.7* 14.6*  NEUTROABS  --   --  10.7*  HGB 9.3* 9.0* 8.7*  HCT 28.2* 27.4* 27.6*  MCV 85.7 86.4 88.2  PLT 603* 617* 878*   Basic Metabolic Panel: Recent Labs  Lab 04/28/21 0134 04/28/21 1905 04/29/21 0433 04/30/21 0157 05/01/21 0136 05/02/21 0206 05/03/21 0100 05/04/21 0204  NA 136 137 136 136 139 139 138 140  K 3.5 3.7 4.2 4.3 4.1 3.8 4.0 3.5  CL 105 105 104 107 111 109  109 110  CO2 18* 19* 19* 18* 18* 19* 20* 18*  GLUCOSE 149* 166* 191* 258* 173* 146* 159* 174*  BUN 42* 44* 43* 40* 35* 31* 26* 25*  CREATININE 2.89* 2.99* 2.91* 2.84* 2.62* 2.30* 2.21* 2.14*  CALCIUM 6.6* 6.3* 6.5* 6.4* 7.0* 6.9* 7.0* 6.7*  MG 2.1 1.8 2.2 2.0  --   --   --  1.8  PHOS 5.2*  --   --   --  3.1 3.4 5.1* 5.1*   GFR: Estimated Creatinine Clearance: 31.9 mL/min (A) (by C-G formula based on SCr of 2.14  mg/dL (H)). Liver Function Tests: Recent Labs  Lab 04/28/21 0134 05/01/21 0136 05/02/21 0206 05/03/21 0100 05/04/21 0204  ALBUMIN 1.9* 2.0* 1.8* 1.8* 1.8*   No results for input(s): LIPASE, AMYLASE in the last 168 hours. No results for input(s): AMMONIA in the last 168 hours. Coagulation Profile: No results for input(s): INR, PROTIME in the last 168 hours.  Cardiac Enzymes: No results for input(s): CKTOTAL, CKMB, CKMBINDEX, TROPONINI in the last 168 hours. BNP (last 3 results) No results for input(s): PROBNP in the last 8760 hours. HbA1C: No results for input(s): HGBA1C in the last 72 hours.  CBG: Recent Labs  Lab 05/03/21 1611 05/03/21 2140 05/04/21 0741 05/04/21 1152 05/04/21 1636  GLUCAP 208* 198* 145* 101* 92   Lipid Profile: No results for input(s): CHOL, HDL, LDLCALC, TRIG, CHOLHDL, LDLDIRECT in the last 72 hours. Thyroid Function Tests: No results for input(s): TSH, T4TOTAL, FREET4, T3FREE, THYROIDAB in the last 72 hours.  Anemia Panel: No results for input(s): VITAMINB12, FOLATE, FERRITIN, TIBC, IRON, RETICCTPCT in the last 72 hours. Urine analysis:    Component Value Date/Time   COLORURINE YELLOW 04/24/2021 [place] 04/24/2021 0446   APPEARANCEUR Clear 02/09/2018 1352   LABSPEC 1.018 04/24/2021 0446   PHURINE 5.0 04/24/2021 0446   GLUCOSEU NEGATIVE 04/24/2021 0446   HGBUR NEGATIVE 04/24/2021 0446   BILIRUBINUR NEGATIVE 04/24/2021 0446   BILIRUBINUR Negative 02/09/2018 [place] 04/24/2021 0446   PROTEINUR 100 (A) 04/24/2021 0446   NITRITE NEGATIVE 04/24/2021 0446   LEUKOCYTESUR NEGATIVE 04/24/2021 0446   No results found for this or any previous visit (from the past 240 hour(s)).     Radiology Studies: No results found.  Scheduled Meds:  amiodarone  400 mg Oral BID   apixaban  5 mg Oral BID   atorvastatin  40 mg Oral Daily   calcium carbonate  1,250 mg Oral QHS   cholecalciferol  1,000 Units Oral Daily    feeding supplement (GLUCERNA SHAKE)  237 mL Oral TID BM   folic acid  1 mg Oral Daily   insulin aspart  0-5 Units Subcutaneous QHS   insulin aspart  0-9 Units Subcutaneous TID WC   insulin aspart  3 Units Subcutaneous TID WC   isosorbide dinitrate  20 mg Oral BID   levothyroxine  100 mcg Oral QAC breakfast   metoprolol succinate  100 mg Oral Daily   multivitamin with minerals  1 tablet Oral Daily   pantoprazole  40 mg Oral BID   sertraline  50 mg Oral QHS   sodium bicarbonate  1,300 mg Oral TID   thiamine  100 mg Oral Daily   Continuous Infusions:  sodium chloride Stopped (04/20/21 0916)     LOS: 14 days   Time spent: 25 minutes.  Patrecia Pour, MD [facility] [email or web address] 05/04/2021, 5:56 PM

## 2021-05-04 NOTE — Progress Notes (Addendum)
Progress Note  Patient Name: Todd [name] Date of Encounter: 05/04/2021  Primary Cardiologist: Minus Breeding, MD   Subjective   Patient breathing OK   No CP  Inpatient Medications    Scheduled Meds:  amiodarone  400 mg Oral BID   apixaban  5 mg Oral BID   atorvastatin  40 mg Oral Daily   calcium carbonate  1,250 mg Oral QHS   cholecalciferol  1,000 Units Oral Daily   folic acid  1 mg Oral Daily   insulin aspart  0-5 Units Subcutaneous QHS   insulin aspart  0-9 Units Subcutaneous TID WC   insulin aspart  3 Units Subcutaneous TID WC   isosorbide dinitrate  20 mg Oral BID   levothyroxine  100 mcg Oral QAC breakfast   metoprolol succinate  100 mg Oral Daily   multivitamin with minerals  1 tablet Oral Daily   pantoprazole  40 mg Oral BID   sertraline  50 mg Oral QHS   sodium bicarbonate  1,300 mg Oral TID   thiamine  100 mg Oral Daily   Continuous Infusions:  sodium chloride Stopped (04/20/21 0916)   PRN Meds: sodium chloride, acetaminophen, ipratropium-albuterol, nicotine   Vital Signs    Vitals:   05/03/21 1412 05/03/21 2053 05/04/21 0500 05/04/21 0528  BP: 112/71 120/64  121/61  Pulse: 72 73  65  Resp: 17 20  17   Temp: 98.5 F (36.9 C) 98.9 F (37.2 C)  98.6 F (37 C)  TempSrc: Oral Oral  Oral  SpO2: 100% 98%  100%  Weight:   66.5 kg   Height:       No intake or output data in the 24 hours ending 05/04/21 0637  Filed Weights   05/02/21 0548 05/03/21 0438 05/04/21 0500  Weight: 67.1 kg 66.1 kg 66.5 kg    Telemetry    SR   12 beat NSVT  -personally reviewed  ECG    None new  Physical Exam   GEN: Pt is in no acute distress  HEENT: normal  Neck: no JVD Cardiac: RRR; no murmurs  ,no LE  edema  Respiratory:  clear to auscultation bilaterally,  GI: soft, nontender, nondistended, + BS MS: no deformity or atrophy  Skin: warm and dry, bilateral AKA  no edema in thighs  Neuro:  Strength and sensation are intact Psych: euthymic mood, full  affect   Labs    Chemistry Recent Labs  Lab 05/02/21 0206 05/03/21 0100 05/04/21 0204  NA 139 138 140  K 3.8 4.0 3.5  CL 109 109 110  CO2 19* 20* 18*  GLUCOSE 146* 159* 174*  BUN 31* 26* 25*  CREATININE 2.30* 2.21* 2.14*  CALCIUM 6.9* 7.0* 6.7*  ALBUMIN 1.8* 1.8* 1.8*  GFRNONAA 31* 32* 33*  ANIONGAP 11 9 12      Hematology Recent Labs  Lab 04/28/21 0134 04/29/21 0433 05/04/21 0204  WBC 16.4* 14.7* 14.6*  RBC 3.29* 3.17* 3.13*  HGB 9.3* 9.0* 8.7*  HCT 28.2* 27.4* 27.6*  MCV 85.7 86.4 88.2  MCH 28.3 28.4 27.8  MCHC 33.0 32.8 31.5  RDW 15.4 15.4 15.3  PLT 603* 617* 482*    Cardiac EnzymesNo results for input(s): TROPONINI in the last 168 hours. No results for input(s): TROPIPOC in the last 168 hours.   BNPNo results for input(s): BNP, PROBNP in the last 168 hours.   DDimer No results for input(s): DDIMER in the last 168 hours.   Radiology    No results  found.  Cardiac Studies  Echo 04/20/21  1. Left ventricular ejection fraction, by estimation, is 45 to 50%. The  left ventricle has mildly decreased function. There is moderate left  ventricular hypertrophy.   2. Right ventricular systolic function is normal. The right ventricular  size is normal.   3. Left atrial size was severely dilated.   4. Right atrial size was moderately dilated.   5. The inferior vena cava is normal in size with greater than 50%  respiratory variability, suggesting right atrial pressure of 3 mmHg.   6. Limited echo evaluate LV function.   Patient Profile     65 y.o. male presumed CAD, mild cardiomyopathy with normalization of EF on Echo in 12/2020, paroxysmal atrial fibrillation on Eliquis, PAD s/p bilateral AKA in 2018, hypertension, hyperlipidemia, type 2 diabetes mellitus, hypothyroidism, CKD stage III, polysubstance abuse (cocaine, alcohol, tobacco), and poor compliance who was admitted on 04/19/2021 with acute hypoxic respiratory failure secondary to community acquired pneumonia  and CHF.   Assessment & Plan   Principal Problem:   CAP (community acquired pneumonia) Active Problems:   Essential hypertension   Acquired hypothyroidism   Elevated troponin   Acute respiratory failure with hypoxia (HCC)   Hyperglycemia due to diabetes mellitus (HCC)   Leukocytosis   Reactive thrombocytosis   S/P AKA (above knee amputation) bilateral (HCC)   AKI (acute kidney injury) ([place])   Atrial fibrillation, chronic (HCC)   Hypomagnesemia   Elevated brain natriuretic peptide (BNP) level   Polysubstance abuse (HCC)   Noncompliance with medication regimen   Chronic diastolic CHF (congestive heart failure) (HCC)   Hypoalbuminemia due to protein-calorie malnutrition (HCC)   GERD (gastroesophageal reflux disease)   1.  Acute on chronic systolic heart failure with mildly reduced ejection fraction\ Diuretics are on hold with elevated Cr  This is improved   Off      Continue toprol Hold other agents  Do not want to drop BP now and have drop in renal perfusion   2.  Nonsustained ventricular tachycardia Currently on amiodarone.  VT has improved with current loading.  He will likely need an ischemic evaluation, though with his kidney function we will hold off.  Continue Toprol-XL as well  Short burst NSVT  Will need to follow as outpt  No CP    2.  Elevated troponin with presumed coronary artery disease: Ejection fraction 45%.  No current chest pain.  Likely need an ischemic evaluation if kidney function improves.   CP free     3.  Paroxysmal atrial fibrillation: Maintaining sinus rhythm on amiodarone.  Continue Eliquis and Toprol-XL.  4.  Peripheral arterial disease: Status post bilateral AKA in 2018.  Continue statin.  5.  Hypertension: Blood pressure well controlled.on current regimen  6.  Hyperlipidemia: Continue Crestor 40 mg  7.  Acute on chronic CKD stage III:ve. Cr is improving   Renal following   Holding diuretics   Pt has appt in renal  Will make sure he has f/u in  cardiology  OK to d/c      For questions or updates, please contact [facility] Please consult [email or web address] for contact info under        Signed, Dorris Carnes, MD  05/04/2021, 6:37 AM

## 2021-05-04 NOTE — Progress Notes (Signed)
Nutrition Follow-up  DOCUMENTATION CODES:   Not applicable  INTERVENTION:   Continue snacks TID between meals. Continue MVI with minerals daily. Add Glucerna Shake po TID, each supplement provides 220 kcal and 10 grams of protein  NUTRITION DIAGNOSIS:   Increased nutrient needs related to chronic illness (CHF) as evidenced by estimated needs.  Ongoing  GOAL:   Patient will meet greater than or equal to 90% of their needs  Progressing  MONITOR:   PO intake, Labs  REASON FOR ASSESSMENT:   Consult Assessment of nutrition requirement/status  ASSESSMENT:   65 y.o. male presented to the ED from a SNF with SOB. PMH includes HTN, CHF, T2DM, bilateral AKA, and EtOH abuse. Pt admitted with community acquired pneumonia.  Patient remains on a heart healthy carb modified diet. Meal intakes not recorded for the past week. Per discussion with nurse tech, patient will eat well if it is something he likes, but often the food he receives is not something he likes d/t heart healthy and carb modified restrictions.  Snacks are ordered between meals, but patient says he does not always receive them. Discussed with RN.   Labs reviewed. Phos 5.1 CBG: 145-101  Medications reviewed and include folic acid, novolog, MVI with minerals, protonix, thiamine.  Admission weight 68.2 kg Current weight 66.5 kg  Diet Order:   Diet Order             Diet heart healthy/carb modified Room service appropriate? Yes; Fluid consistency: Thin  Diet effective now                   EDUCATION NEEDS:   No education needs have been identified at this time  Skin:  Skin Assessment: Reviewed RN Assessment  Last BM:  11/20  Height:   Ht Readings from Last 1 Encounters:  04/19/21 5\' 8"  (1.727 m)    Weight:   Wt Readings from Last 1 Encounters:  05/04/21 66.5 kg   BMI: 26.5 (adjusted for bilateral AKA)  Estimated Nutritional Needs:   Kcal:  1800-2000  Protein:  90-100 gm  Fluid:   1.8-2 L    Lucas Mallow, RD, LDN, CNSC Please refer to Amion for contact information.

## 2021-05-04 NOTE — Discharge Instructions (Signed)
Weigh daily if you have scales and keep a log.  If you weight goes up by 3 pounds in a day or 5 pounds in a week then call the office.    Heart Healthy low salt diet.

## 2021-05-05 ENCOUNTER — Telehealth (HOSPITAL_COMMUNITY): Payer: Self-pay

## 2021-05-05 ENCOUNTER — Inpatient Hospital Stay (HOSPITAL_COMMUNITY): Payer: Medicare HMO

## 2021-05-05 LAB — RENAL FUNCTION PANEL
Albumin: 1.7 g/dL — ABNORMAL LOW (ref 3.5–5.0)
Anion gap: 8 (ref 5–15)
BUN: 24 mg/dL — ABNORMAL HIGH (ref 8–23)
CO2: 19 mmol/L — ABNORMAL LOW (ref 22–32)
Calcium: 6.9 mg/dL — ABNORMAL LOW (ref 8.9–10.3)
Chloride: 112 mmol/L — ABNORMAL HIGH (ref 98–111)
Creatinine, Ser: 2.12 mg/dL — ABNORMAL HIGH (ref 0.61–1.24)
GFR, Estimated: 34 mL/min — ABNORMAL LOW (ref >60)
Glucose, Bld: 97 mg/dL (ref 70–99)
Phosphorus: 5.1 mg/dL — ABNORMAL HIGH (ref 2.5–4.6)
Potassium: 3.5 mmol/L (ref 3.5–5.1)
Sodium: 139 mmol/L (ref 135–145)

## 2021-05-05 LAB — GLUCOSE, CAPILLARY
Glucose-Capillary: 110 mg/dL — ABNORMAL HIGH (ref 70–99)
Glucose-Capillary: 102 mg/dL — ABNORMAL HIGH (ref 70–99)
Glucose-Capillary: 79 mg/dL (ref 70–99)
Glucose-Capillary: 95 mg/dL (ref 70–99)

## 2021-05-05 LAB — PROCALCITONIN: Procalcitonin: 0.22 ng/mL

## 2021-05-05 LAB — MAGNESIUM: Magnesium: 1.8 mg/dL (ref 1.7–2.4)

## 2021-05-05 LAB — SARS CORONAVIRUS 2 (TAT 6-24 HRS): SARS Coronavirus 2: NEGATIVE

## 2021-05-05 LAB — SEDIMENTATION RATE: Sed Rate: >140 mm/hr — ABNORMAL HIGH (ref 0–16)

## 2021-05-05 MED ORDER — POTASSIUM CHLORIDE CRYS ER 20 MEQ PO TBCR
40.0000 meq | EXTENDED_RELEASE_TABLET | Freq: Once | ORAL | Status: AC
  Administered 2021-05-05: 40 meq via ORAL
  Filled 2021-05-05: qty 2

## 2021-05-05 MED ORDER — FUROSEMIDE 10 MG/ML IJ SOLN
40.0000 mg | Freq: Once | INTRAMUSCULAR | Status: AC
  Administered 2021-05-05: 40 mg via INTRAVENOUS
  Filled 2021-05-05: qty 4

## 2021-05-05 MED ORDER — FUROSEMIDE 10 MG/ML IJ SOLN
80.0000 mg | Freq: Once | INTRAMUSCULAR | Status: AC
  Administered 2021-05-05: 80 mg via INTRAVENOUS
  Filled 2021-05-05: qty 8

## 2021-05-05 NOTE — Progress Notes (Addendum)
PROGRESS NOTE  Todd [name]  NOM:[id number] DOB: Dec 26, 1955 DOA: 04/19/2021 PCP: [name], FNP  Brief Narrative: Todd [name] is a 65 y.o. male recently admitted from 01/10/2021 to 01/15/2021 due to chronic A. fib with RVR, with medical history significant for essential hypertension, hypothyroidism, type 2 diabetes mellitus, chronic atrial fibrillation, status post bilateral AKA, history of polysubstance abuse (alcohol, tobacco and cocaine) who presents to [facility] ED from [facility] via EMS due to shortness of breath.  He was noted to be hypoxic, in the 80% on room air at the SNF, neb treatment was provided and patient was placed on a supplemental oxygen via [place] at 2 LPM with minimal improvement.  EMS was activated and on arrival, O2 supplementation was increased to 4 LPM en route to the hospital.   Work-up revealed acute hypoxic respiratory failure, right-sided community-acquired pneumonia, mild pulmonary edema, elevated troponin, elevated BNP greater than 800, acute on chronic diastolic CHF, AKI, non-anion gap metabolic acidosis, and electrolyte abnormalities.  Echocardiogram revealed LV systolic dysfunction and monitoring detected RVR. Lasix was given with subsequently increasing creatinine. Diuresis pushed despite renal impairment due to progressive pulmonary edema on CXR with hypoxia.   Assessment & Plan: Principal Problem:   CAP (community acquired pneumonia) Active Problems:   Essential hypertension   Acquired hypothyroidism   Elevated troponin   Acute respiratory failure with hypoxia (HCC)   Hyperglycemia due to diabetes mellitus (HCC)   Leukocytosis   Reactive thrombocytosis   S/P AKA (above knee amputation) bilateral (HCC)   AKI (acute kidney injury) ([place])   Atrial fibrillation, chronic (HCC)   Hypomagnesemia   Elevated brain natriuretic peptide (BNP) level   Polysubstance abuse (HCC)   Noncompliance with medication regimen   Chronic diastolic CHF (congestive  heart failure) (HCC)   Hypoalbuminemia due to protein-calorie malnutrition (HCC)   GERD (gastroesophageal reflux disease)  Acute hypoxic respiratory failure: Presumed to be due to CHF and right-sided pneumonia, COPD exacerbation. CHF suspected primary component based on progressive infiltrates that quickly resolved with diuresis.  - Hypoxia seems to recur when sleeping, so possibly an element of OSA. Clubbing also noted suggestive of a more long term possibly occult hypoxemia. May need supplemental oxygen at discharge.  -TOC team checked with patient's prior to admission SNF, he was not on oxygen.  Was on 4 L/min [place] oxygen this morning, titrated down to 2 L/min by RT. - Chest x-ray 11/23 personally reviewed: Recurrent bilateral airspace opacities.  Unclear etiology.  Clinically does not appear volume overloaded, apart from mild leukocytosis-no concern for pneumonia and recently completed a prolonged course of IV antibiotics and procalcitonin only 0.22, no hemoptysis.  Discussed with nephrology and gave a dose of IV Lasix 40 mg x 1.  Follow-up two-view chest x-ray in a.m.  Discussed with PCCM MD on-call who reviewed imaging and indicated that acute amiodarone lung injury may be a possibility and suggested switching amiodarone to something else.  Agreed with IV Lasix and follow-up chest x-ray.  Discussed with cardiology, will review with EP regarding changing amiodarone to an alternate agent.  Also suggested checking ESR.  As per update from Dr. Harrington Challenger, Cardiology, Reds vest reading of 53 suggests pulmonary edema is most likely etiology, recommends an additional higher dose of IV Lasix 80 mg x 1 later tonight-ordered.  NSVT: Suspected ischemic etiology per cardiology.  - Will require work up (cMRI, LHC) though renal impairment is currently prohibitive.  - Continue amiodarone loading. Continue metoprolol - Continue cardiac monitoring.  -  Keep K >4, Mg >2. Recheck daily. - Please see above discussion  regarding amiodarone.  PAF, multifocal atrial tachycardia: Recent admit for RVR.  - Continue metoprolol succinate, increased to 159m. Now in sinus rhythm for the most part. - Continue eliquis for anticoagulation - Maintain K >4, Mg >2.   Acute on chronic HFrEF, HTN: LVEF 45-50% without RWMA, +LVH, normal IVC. Severe LAE, moderate RAE. BNP elevated.  - Cardiology consulted, deferring ischemic eval for now. If renal impairment continues to improve, may pursue work up as inpatient. They are inclined to defer to outpatient setting.  - Converted norvasc to bidil. Continue metoprolol succinate.   AKI with NAGMA: No abnormality on U/S, though did have trace perinephric fluid on right. Bland sediment with definite proteinuria 1.03. FENa not consistent with prerenal azotemia at 1.78%. Note history of worse AKIs previously.  - Holding diuresis as above. Monitoring metabolic panel daily, Cr has continued slow improvement since 11/16. - Continue bicarbonate tabs, even at discharge as per nephrology. Appreciate nephrology guidance. - As per nephrology, no indication to start diuretic (although subsequently we discussed and patient given a dose of IV Lasix 40 mg x 1), they will arrange outpatient follow-up.  Demand myocardial ischemia: Mild troponin elevation with reassuring delta no anginal complaints.  - Defer w/u to cardiology  Right-sided CAP: Completed azithromycin x5 days. Stopped vanc w/nephrotoxicity and negative MRSA PCR. Completed 7 days of zosyn. Blood Cx's NGTD x5 days, no sputum culture obtained.  COPD with exacerbation:  - Continue inhaled BDs, avoiding steroids due to severe hyperglycemia. Exacerbation has resolved.  Uncontrolled T2DM with steroid-induced hyperglycemia: HbA1c 7.9%.  - Insulin to continue TIDWC + SSI novolog.  Polysubstance abuse: Previously +alcohol, tobacco, cocaine.  - Cessation counseling.  - Continue nicotine patch.   Depression:  - Restarted SSRI. Recommend  pursuing outpatient therapy. No indication for inpatient psychiatry consultation at this time.  Hypocalcemia:  - Continue supplementation. Corrected value (albumin 1.8) = 8.8. - Vitamin D 29.7. Supplement.  Hypothyroidism: Repeat TSH is 0.315, last was 20.52 in September 2022 which was improved from 189 in July in setting of medication nonadherence. Free T4 is 1.12 which is the ULN.  - In setting of AFib/MAT, we did decrease synthroid dose 1182m > 10059m - Recommend repeating full TFTs in 4 weeks.  Anemia of chronic disease: Having a decline in hgb from admission, though all cell lines are down and hgb was above baseline on admission.  - Hemoglobin has stabilized somewhat near baseline ~9g/dl   HTN: Tx as above  PVD s/p bilateral AKA (2018-2019):  - Continue statin, anticoagulation  GERD:  - Continue PPI  Suspected malnutrition with hypoalbuminemia. - Dietitian consulted and as per their note 11/62/69o cords applicable..   DVT prophylaxis: Eliquis Code Status: Full Family Communication: None at bedside. Disposition Plan:  Status is: Inpatient  Remains inpatient appropriate because: Has new finding of worsening chest x-ray, persistent hypoxia that needs further evaluation.  Consultants:  Cardiology Nephrology  Procedures:  Echocardiogram  Antimicrobials: Ceftriaxone, azithromycin > vancomycin, zosyn.  Subjective: Chronically complains of on and off dyspnea but only occasional, since I have cared for him for a few days last week and today.  Denies cough or hemoptysis.  No chest pain.  Objective: Vitals:   05/04/21 1334 05/04/21 1935 05/05/21 1013 05/05/21 1529  BP: 125/68 131/70  131/84  Pulse: 69 71  71  Resp: (!) 23 16  17   Temp:  98.5 F (36.9 C) 97.8 F (36.6  C) 97.8 F (36.6 C)  TempSrc:  Oral Oral Oral  SpO2: 98% 93%  97%  Weight:      Height:        Intake/Output Summary (Last 24 hours) at 05/05/2021 1548 Last data filed at 05/05/2021 1454 Gross  per 24 hour  Intake --  Output 250 ml  Net -250 ml    Filed Weights   05/02/21 0548 05/03/21 0438 05/04/21 0500  Weight: 67.1 kg 66.1 kg 66.5 kg   Gen: 65 y.o. male in no distress.  Does get somewhat winded with minimal activity such as even turning in bed. Pulm: Harsh breath sounds bilaterally.  Occasional crackles scattered bilaterally.  No wheezing or rhonchi.  DOE. CV: S1 and S2 heard, RRR.  No JVD.  Bilateral AKA's unable to appreciate pedal edema.  No thigh edema.  Trace presacral edema.  Telemetry personally reviewed: Sinus rhythm.  NSVT of 5 beats was the longest he has had in the last 2 days. GI: Abdomen soft, non-tender, non-distended, with normoactive bowel sounds.  Ext: Warm, dry, BL AKA.  Right stump with dressing clean and dry. Skin: No new rashes, lesions or ulcers on visualized skin. Neuro: Alert and oriented. No focal neurological deficits. Psych: Judgement and insight appear fair. Mood euthymic & affect congruent. Behavior is appropriate.    Data Reviewed: I have personally reviewed following labs and imaging studies  CBC: Recent Labs  Lab 04/29/21 0433 05/04/21 0204  WBC 14.7* 14.6*  NEUTROABS  --  10.7*  HGB 9.0* 8.7*  HCT 27.4* 27.6*  MCV 86.4 88.2  PLT 617* 567*   Basic Metabolic Panel: Recent Labs  Lab 04/28/21 1905 04/29/21 0433 04/30/21 0157 05/01/21 0136 05/02/21 0206 05/03/21 0100 05/04/21 0204 05/05/21 0234  NA 137 136 136 139 139 138 140 139  K 3.7 4.2 4.3 4.1 3.8 4.0 3.5 3.5  CL 105 104 107 111 109 109 110 112*  CO2 19* 19* 18* 18* 19* 20* 18* 19*  GLUCOSE 166* 191* 258* 173* 146* 159* 174* 97  BUN 44* 43* 40* 35* 31* 26* 25* 24*  CREATININE 2.99* 2.91* 2.84* 2.62* 2.30* 2.21* 2.14* 2.12*  CALCIUM 6.3* 6.5* 6.4* 7.0* 6.9* 7.0* 6.7* 6.9*  MG 1.8 2.2 2.0  --   --   --  1.8 1.8  PHOS  --   --   --  3.1 3.4 5.1* 5.1* 5.1*   GFR: Estimated Creatinine Clearance: 32.2 mL/min (A) (by C-G formula based on SCr of 2.12 mg/dL (H)). Liver  Function Tests: Recent Labs  Lab 05/01/21 0136 05/02/21 0206 05/03/21 0100 05/04/21 0204 05/05/21 0234  ALBUMIN 2.0* 1.8* 1.8* 1.8* 1.7*   CBG: Recent Labs  Lab 05/04/21 1152 05/04/21 1636 05/04/21 1934 05/05/21 0720 05/05/21 1128  GLUCAP 101* 92 121* 110* 102*   Urine analysis:    Component Value Date/Time   COLORURINE YELLOW 04/24/2021 0446   APPEARANCEUR CLEAR 04/24/2021 0446   APPEARANCEUR Clear 02/09/2018 1352   LABSPEC 1.018 04/24/2021 0446   PHURINE 5.0 04/24/2021 0446   GLUCOSEU NEGATIVE 04/24/2021 0446   HGBUR NEGATIVE 04/24/2021 0446   BILIRUBINUR NEGATIVE 04/24/2021 0446   BILIRUBINUR Negative 02/09/2018 1352   KETONESUR NEGATIVE 04/24/2021 0446   PROTEINUR 100 (A) 04/24/2021 0446   NITRITE NEGATIVE 04/24/2021 0446   LEUKOCYTESUR NEGATIVE 04/24/2021 0446   Recent Results (from the past 240 hour(s))  SARS CORONAVIRUS 2 (TAT 6-24 HRS) Nasopharyngeal Nasopharyngeal Swab     Status: None   Collection Time: 05/04/21  4:46 PM   Specimen: Nasopharyngeal Swab  Result Value Ref Range Status   SARS Coronavirus 2 NEGATIVE NEGATIVE Final    Comment: (NOTE) SARS-CoV-2 target nucleic acids are NOT DETECTED.  The SARS-CoV-2 RNA is generally detectable in upper and lower respiratory specimens during the acute phase of infection. Negative results do not preclude SARS-CoV-2 infection, do not rule out co-infections with other pathogens, and should not be used as the sole basis for treatment or other patient management decisions. Negative results must be combined with clinical observations, patient history, and epidemiological information. The expected result is Negative.  Fact Sheet for Patients: SugarRoll.be  Fact Sheet for Healthcare Providers: https://www.woods-mathews.com/  This test is not yet approved or cleared by the [place] FDA and  has been authorized for detection and/or diagnosis of SARS-CoV-2 by FDA  under an Emergency Use Authorization (EUA). This EUA will remain  in effect (meaning this test can be used) for the duration of the COVID-19 declaration under Se ction 564(b)(1) of the Act, 21 U.S.C. section 360bbb-3(b)(1), unless the authorization is terminated or revoked sooner.  Performed at [facility], [place] [address] [place], [place] [address]        Radiology Studies: DG CHEST PORT 1 VIEW  Result Date: 05/05/2021 CLINICAL DATA:  Shortness of breath evaluation. EXAM: PORTABLE CHEST 1 VIEW COMPARISON:  Radiographs 04/28/2021 and 04/26/2021. FINDINGS: 1053 hours. The heart size and mediastinal contours are stable. There are surgical clips at the thoracic inlet. There are significantly worsening diffuse nodular airspace opacities in both lungs which had improved on the most recent study of 1 week ago. No pneumothorax or significant pleural effusion. The bones appear stable. Telemetry leads overlie the chest. IMPRESSION: Recurrent bilateral airspace opacities suspicious for recurrent multilobar pneumonia. Given fluctuation on recent radiographs, considerations would include pulmonary hemorrhage and atypical inflammation. Electronically Signed   By: Richardean Sale M.D.   On: 05/05/2021 11:06    Scheduled Meds:  amiodarone  400 mg Oral BID   apixaban  5 mg Oral BID   atorvastatin  40 mg Oral Daily   calcium carbonate  1,250 mg Oral QHS   cholecalciferol  1,000 Units Oral Daily   feeding supplement (GLUCERNA SHAKE)  237 mL Oral TID BM   folic acid  1 mg Oral Daily   insulin aspart  0-5 Units Subcutaneous QHS   insulin aspart  0-9 Units Subcutaneous TID WC   insulin aspart  3 Units Subcutaneous TID WC   isosorbide dinitrate  20 mg Oral BID   levothyroxine  100 mcg Oral QAC breakfast   metoprolol succinate  100 mg Oral Daily   multivitamin with minerals  1 tablet Oral Daily   pantoprazole  40 mg Oral BID   sertraline  50 mg Oral QHS   sodium bicarbonate  1,300 mg Oral TID    thiamine  100 mg Oral Daily   Continuous Infusions:  sodium chloride Stopped (04/20/21 0916)     LOS: 15 days   Time spent: 25 minutes.  Vernell Leep, MD,  FACP, [facility], [facility], [facility] (Care Management Physician Certified) [facility] [place]  To contact the attending provider between 7A-7P or the covering provider during after hours 7P-7A, please log into the web site [email or web address] and access using universal [place] password for that web site. If you do not have the password, please call the hospital operator.

## 2021-05-05 NOTE — Progress Notes (Signed)
Pt had severe SOB earlier this afternoon   ReDS reading done   53 consistent with significant volume overload  Patient has had 40 mg lasix this afternoon with 400 cc out CXR done (portable)     Neck:  JVP is increased  Lungs relatively clear Cardiac RRR  No S3 Ext  No thigh edema  Patient has been on amiodarone since 04/29/21 for VT  ESR elevated at >140 but it hs been high in past on all readings   (97, 138, 117)  Does not help much in this situation    I would recomm continued diuresis  given ReDS reading      PA/Lat CXR orderd (AP showed recurrent bilateral ASD)  Keep on amiodarone for now     Will review with EP as well  Dorris Carnes MD

## 2021-05-05 NOTE — Progress Notes (Signed)
   Pt has outpt appt on 05/18/21 at [address]  Will sign off.       Signed, Dorris Carnes, MD  05/05/2021, 8:16 AM

## 2021-05-05 NOTE — TOC Progression Note (Signed)
Transition of Care [facility]) - Progression Note    Patient Details  Name: Todd [name] MRN: [id number] Date of Birth: 10-09-1955  Transition of Care [facility]) CM/SW [place], [place] Phone Number: 05/05/2021, 2:56 PM  Clinical Narrative:     Patient has SNF bed at [facility] when medically ready for dc. CSW will continue to follow and assist with patients dc planning needs.    Barriers to Discharge: Continued Medical Work up  Expected Discharge Plan and Services           Expected Discharge Date: 04/25/21                                     Social Determinants of Health (SDOH) Interventions    Readmission Risk Interventions No flowsheet data found.

## 2021-05-05 NOTE — Progress Notes (Signed)
Patient ID: Todd [name], male   DOB: 07/24/1955, 65 y.o.   MRN: [id number] [facility] Progress Note   Assessment/ Plan:   1. Acute kidney Injury on chronic kidney disease stage IIIa: Urine output not charted from overnight with continued but slow improvement of BUN/creatinine, etiology suspected to be hemodynamic with intravenous diuresis/arrhythmias and relative hypotension. Corrected calcium for his low albumin is within normal range.  No indication to start diuretic and will set up for nephrology follow-up as an outpatient.  He appears stable from a renal standpoint to discharge back to SNF. 2.  Acute hypoxic respiratory failure: Secondary to pneumonia and congestive heart failure-on diuretics transiently held after developing worsening renal function and he is status post azithromycin and Zosyn. 3.  Anion gap metabolic acidosis: Secondary to acute kidney injury, currently on sodium bicarbonate supplementation and I recommend continuing this on discharge with the determination for continuation to be made at renal follow-up. 4.  Anemia: Likely secondary to acute/critical illness, will continue to follow trend to decide on need for PRBC transfusion versus additional evaluation 5.  History of polysubstance abuse  Subjective:   Denies any acute complaints overnight and states that he is not hungry.   Objective:   BP 131/70 (BP Location: Left Arm)   Pulse 71   Temp 98.5 F (36.9 C) (Oral)   Resp 16   Ht 5\' 8"  (1.727 m)   Wt 66.5 kg   SpO2 93%   BMI 22.29 kg/m  No intake or output data in the 24 hours ending 05/05/21 0811  Weight change:   Physical Exam: Gen: Comfortably resting in bed, on oxygen via nasal cannula CVS: Pulse regular rhythm, normal rate, S1 and S2 normal Resp: Clear to auscultation bilaterally, no distinct rales or rhonchi Abd: Soft, flat, nontender Ext: Status post bilateral AKA with protective dressing over right stump.  No edema  Imaging: No  results found.  Labs: BMET Recent Labs  Lab 04/29/21 0433 04/30/21 0157 05/01/21 0136 05/02/21 0206 05/03/21 0100 05/04/21 0204 05/05/21 0234  NA 136 136 139 139 138 140 139  K 4.2 4.3 4.1 3.8 4.0 3.5 3.5  CL 104 107 111 109 109 110 112*  CO2 19* 18* 18* 19* 20* 18* 19*  GLUCOSE 191* 258* 173* 146* 159* 174* 97  BUN 43* 40* 35* 31* 26* 25* 24*  CREATININE 2.91* 2.84* 2.62* 2.30* 2.21* 2.14* 2.12*  CALCIUM 6.5* 6.4* 7.0* 6.9* 7.0* 6.7* 6.9*  PHOS  --   --  3.1 3.4 5.1* 5.1* 5.1*   CBC Recent Labs  Lab 04/29/21 0433 05/04/21 0204  WBC 14.7* 14.6*  NEUTROABS  --  10.7*  HGB 9.0* 8.7*  HCT 27.4* 27.6*  MCV 86.4 88.2  PLT 617* 482*    Medications:     amiodarone  400 mg Oral BID   apixaban  5 mg Oral BID   atorvastatin  40 mg Oral Daily   calcium carbonate  1,250 mg Oral QHS   cholecalciferol  1,000 Units Oral Daily   feeding supplement (GLUCERNA SHAKE)  237 mL Oral TID BM   folic acid  1 mg Oral Daily   insulin aspart  0-5 Units Subcutaneous QHS   insulin aspart  0-9 Units Subcutaneous TID WC   insulin aspart  3 Units Subcutaneous TID WC   isosorbide dinitrate  20 mg Oral BID   levothyroxine  100 mcg Oral QAC breakfast   metoprolol succinate  100 mg Oral Daily   multivitamin with  minerals  1 tablet Oral Daily   pantoprazole  40 mg Oral BID   sertraline  50 mg Oral QHS   sodium bicarbonate  1,300 mg Oral TID   thiamine  100 mg Oral Daily   Elmarie Shiley, MD 05/05/2021, 8:11 AM

## 2021-05-06 ENCOUNTER — Inpatient Hospital Stay (HOSPITAL_COMMUNITY): Payer: Medicare HMO

## 2021-05-06 DIAGNOSIS — Z89611 Acquired absence of right leg above knee: Secondary | ICD-10-CM | POA: Diagnosis not present

## 2021-05-06 DIAGNOSIS — I10 Essential (primary) hypertension: Secondary | ICD-10-CM | POA: Diagnosis not present

## 2021-05-06 DIAGNOSIS — E160 Drug-induced hypoglycemia without coma: Secondary | ICD-10-CM

## 2021-05-06 DIAGNOSIS — R778 Other specified abnormalities of plasma proteins: Secondary | ICD-10-CM | POA: Diagnosis not present

## 2021-05-06 DIAGNOSIS — T383X5A Adverse effect of insulin and oral hypoglycemic [antidiabetic] drugs, initial encounter: Secondary | ICD-10-CM

## 2021-05-06 DIAGNOSIS — J9601 Acute respiratory failure with hypoxia: Secondary | ICD-10-CM | POA: Diagnosis not present

## 2021-05-06 LAB — BASIC METABOLIC PANEL
Anion gap: 11 (ref 5–15)
BUN: 24 mg/dL — ABNORMAL HIGH (ref 8–23)
CO2: 20 mmol/L — ABNORMAL LOW (ref 22–32)
Calcium: 7.3 mg/dL — ABNORMAL LOW (ref 8.9–10.3)
Chloride: 111 mmol/L (ref 98–111)
Creatinine, Ser: 2.46 mg/dL — ABNORMAL HIGH (ref 0.61–1.24)
GFR, Estimated: 28 mL/min — ABNORMAL LOW (ref >60)
Glucose, Bld: 97 mg/dL (ref 70–99)
Potassium: 4.3 mmol/L (ref 3.5–5.1)
Sodium: 142 mmol/L (ref 135–145)

## 2021-05-06 LAB — GLUCOSE, CAPILLARY
Glucose-Capillary: 170 mg/dL — ABNORMAL HIGH (ref 70–99)
Glucose-Capillary: 66 mg/dL — ABNORMAL LOW (ref 70–99)
Glucose-Capillary: 66 mg/dL — ABNORMAL LOW (ref 70–99)
Glucose-Capillary: 97 mg/dL (ref 70–99)
Glucose-Capillary: 97 mg/dL (ref 70–99)
Glucose-Capillary: 157 mg/dL — ABNORMAL HIGH (ref 70–99)

## 2021-05-06 LAB — MAGNESIUM: Magnesium: 1.8 mg/dL (ref 1.7–2.4)

## 2021-05-06 LAB — CBC
HCT: 28.9 % — ABNORMAL LOW (ref 39.0–52.0)
Hemoglobin: 9.4 g/dL — ABNORMAL LOW (ref 13.0–17.0)
MCH: 28.2 pg (ref 26.0–34.0)
MCHC: 32.5 g/dL (ref 30.0–36.0)
MCV: 86.8 fL (ref 80.0–100.0)
Platelets: 519 10*3/uL — ABNORMAL HIGH (ref 150–400)
RBC: 3.33 MIL/uL — ABNORMAL LOW (ref 4.22–5.81)
RDW: 14.9 % (ref 11.5–15.5)
WBC: 13.1 10*3/uL — ABNORMAL HIGH (ref 4.0–10.5)
nRBC: 0 % (ref 0.0–0.2)

## 2021-05-06 LAB — PROCALCITONIN: Procalcitonin: 0.28 ng/mL

## 2021-05-06 MED ORDER — FUROSEMIDE 10 MG/ML IJ SOLN
40.0000 mg | Freq: Two times a day (BID) | INTRAMUSCULAR | Status: AC
  Administered 2021-05-06 – 2021-05-07 (×3): 40 mg via INTRAVENOUS
  Filled 2021-05-06 (×3): qty 4

## 2021-05-06 NOTE — Progress Notes (Signed)
Progress Note  Patient Name: Todd [name] Date of Encounter: 05/06/2021  [facility] Cardiologist: Minus Breeding, MD   Subjective   Feels better this morning. Breathing improved. Still with elevated JVD.   Patient with episodes of SOB yesterday with ReDS 53 consistent with significant volume overload. S/p lasix 40 and then 70 yesterday. Cr rising to 2.4  Inpatient Medications    Scheduled Meds:  amiodarone  400 mg Oral BID   apixaban  5 mg Oral BID   atorvastatin  40 mg Oral Daily   calcium carbonate  1,250 mg Oral QHS   cholecalciferol  1,000 Units Oral Daily   feeding supplement (GLUCERNA SHAKE)  237 mL Oral TID BM   folic acid  1 mg Oral Daily   insulin aspart  0-5 Units Subcutaneous QHS   insulin aspart  0-9 Units Subcutaneous TID WC   insulin aspart  3 Units Subcutaneous TID WC   isosorbide dinitrate  20 mg Oral BID   levothyroxine  100 mcg Oral QAC breakfast   metoprolol succinate  100 mg Oral Daily   multivitamin with minerals  1 tablet Oral Daily   pantoprazole  40 mg Oral BID   sertraline  50 mg Oral QHS   sodium bicarbonate  1,300 mg Oral TID   thiamine  100 mg Oral Daily   Continuous Infusions:  sodium chloride Stopped (04/20/21 0916)   PRN Meds: sodium chloride, acetaminophen, ipratropium-albuterol, nicotine   Vital Signs    Vitals:   05/04/21 1935 05/05/21 1013 05/05/21 1529 05/05/21 2046  BP: 131/70  131/84 126/70  Pulse: 71  71 72  Resp: 16  17 16   Temp: 98.5 F (36.9 C) 97.8 F (36.6 C) 97.8 F (36.6 C)   TempSrc: Oral Oral Oral   SpO2: 93%  97% 99%  Weight:      Height:        Intake/Output Summary (Last 24 hours) at 05/06/2021 0825 Last data filed at 05/06/2021 0008 Gross per 24 hour  Intake --  Output 1621 ml  Net -1621 ml   Last 3 Weights 05/04/2021 05/03/2021 05/02/2021  Weight (lbs) 146 lb 9.7 oz 145 lb 11.6 oz 148 lb  Weight (kg) 66.5 kg 66.1 kg 67.132 kg      Telemetry    NSR, PVCs and PACs, no NSVT -  Personally Reviewed  ECG    No new ECG - Personally Reviewed  Physical Exam   GEN: Sitting in bed, comfortable Neck: JVD to the angle of the mandible Cardiac: RRR, no murmurs, rubs, or gallops.  Respiratory: Clear with good air movement GI: Soft, nontender, non-distended  MS: bilateral AKA, no significant edema  Neuro:  Nonfocal  Psych: Normal affect   Labs    High Sensitivity Troponin:   Recent Labs  Lab 04/19/21 0019 04/20/21 0254 04/20/21 0502 04/20/21 0639  TROPONINIHS 52* 55* 55* 56*     Chemistry Recent Labs  Lab 05/03/21 0100 05/04/21 0204 05/05/21 0234 05/06/21 0154  NA 138 140 139 142  K 4.0 3.5 3.5 4.3  CL 109 110 112* 111  CO2 20* 18* 19* 20*  GLUCOSE 159* 174* 97 97  BUN 26* 25* 24* 24*  CREATININE 2.21* 2.14* 2.12* 2.46*  CALCIUM 7.0* 6.7* 6.9* 7.3*  MG  --  1.8 1.8 1.8  ALBUMIN 1.8* 1.8* 1.7*  --   GFRNONAA 32* 33* 34* 28*  ANIONGAP 9 12 8 11     Lipids No results for input(s): CHOL, TRIG, HDL, LABVLDL,  LDLCALC, CHOLHDL in the last 168 hours.  Hematology Recent Labs  Lab 05/04/21 0204 05/06/21 0154  WBC 14.6* 13.1*  RBC 3.13* 3.33*  HGB 8.7* 9.4*  HCT 27.6* 28.9*  MCV 88.2 86.8  MCH 27.8 28.2  MCHC 31.5 32.5  RDW 15.3 14.9  PLT 482* 519*   Thyroid No results for input(s): TSH, FREET4 in the last 168 hours.  BNPNo results for input(s): BNP, PROBNP in the last 168 hours.  DDimer No results for input(s): DDIMER in the last 168 hours.   Radiology    DG Chest 2 View  Result Date: 05/06/2021 CLINICAL DATA:  Shortness of breath EXAM: CHEST - 2 VIEW COMPARISON:  Yesterday FINDINGS: Indistinct bilateral airspace disease. Improved aeration from yesterday. Trace pleural fluid. No pneumothorax. Postoperative thoracic inlet. Normal heart size. IMPRESSION: Improving bilateral airspace disease. Electronically Signed   By: Jorje Guild M.D.   On: 05/06/2021 06:22   DG CHEST PORT 1 VIEW  Result Date: 05/05/2021 CLINICAL DATA:  Shortness of  breath evaluation. EXAM: PORTABLE CHEST 1 VIEW COMPARISON:  Radiographs 04/28/2021 and 04/26/2021. FINDINGS: 1053 hours. The heart size and mediastinal contours are stable. There are surgical clips at the thoracic inlet. There are significantly worsening diffuse nodular airspace opacities in both lungs which had improved on the most recent study of 1 week ago. No pneumothorax or significant pleural effusion. The bones appear stable. Telemetry leads overlie the chest. IMPRESSION: Recurrent bilateral airspace opacities suspicious for recurrent multilobar pneumonia. Given fluctuation on recent radiographs, considerations would include pulmonary hemorrhage and atypical inflammation. Electronically Signed   By: Richardean Sale M.D.   On: 05/05/2021 11:06    Cardiac Studies   Echo 04/20/21   1. Left ventricular ejection fraction, by estimation, is 45 to 50%. The  left ventricle has mildly decreased function. There is moderate left  ventricular hypertrophy.   2. Right ventricular systolic function is normal. The right ventricular  size is normal.   3. Left atrial size was severely dilated.   4. Right atrial size was moderately dilated.   5. The inferior vena cava is normal in size with greater than 50%  respiratory variability, suggesting right atrial pressure of 3 mmHg.   6. Limited echo evaluate LV function.   Patient Profile     65 y.o. male with history of presumed CAD, mild cardiomyopathy with normalization of EF on Echo in 12/2020, paroxysmal atrial fibrillation on Eliquis, PAD s/p bilateral AKA in 2018, hypertension, hyperlipidemia, type 2 diabetes mellitus, hypothyroidism, CKD stage III, polysubstance abuse (cocaine, alcohol, tobacco), and poor compliance who was admitted on 04/19/2021 with acute hypoxic respiratory failure secondary to community acquired pneumonia and CHF. Course has been complicated by NSVT with concern for underlying CAD although unable to assess coronaries due to worsening renal  function; pAfib; and AKI on CKD.   Assessment & Plan    #Acute on Chronic HF with Mildly Reduced EF: Patient initially presented with worsening SOB found to be volume overloaded consistent with acute on chronic HF exacerbation as well as with CAP. Initially volume status improved with diuresis and lasix was held, however, decompensated on 05/05/21 with REDs-clip 53 consistent with volume overload. Given lasix 40mg  followed by 80mg . Remains volume up today. -Lasix 40mg  BID -Continue metop 100mg  XL daily -GDMT limited due to renal function -Monitor I/Os and daily weights -Will need ischemic evaluation once Cr improves  #NSVT: Improved on amiodarone. Unable to assess coronaries due to worsening renal function; will need once able. -Continue  amiodarone load -Will need ischemic evaluation with cath once renal function improves  #pAFib: -Continue metop 100mg  XL daily -Continue amiodarone as above -Continue apixaban 5mg  BID for [facility]  #Elevated Troponin with Presumed CAD: No chest pain currently. LVEF 45-50%. Will need ischemic evaluation once Cr improves. -Plan for ischemic evaluation once Cr improves -No ASA due to need for apixaban -Continue crestor 40mg  daily  #HTN: With episodes of hypotension during hospitalization. -Continue metop as above  #PAD s/p AKA: -Continue crestor 40mg  daily as above  #AKI on CKD III: Now with volume overload. Renal following -Renal following, appreciate recommendations -Diuresis as above  #CAP: -Completed ABX     For questions or updates, please contact [place] Please consult [email or web address] for contact info under        Signed, Freada Bergeron, MD  05/06/2021, 8:25 AM

## 2021-05-06 NOTE — Progress Notes (Addendum)
PROGRESS NOTE  [name]  RXV:[id number] DOB: 1955/11/16 DOA: 04/19/2021 PCP: [name], FNP  Brief Narrative: Todd [name] is a 64 y.o. male recently admitted from 01/10/2021 to 01/15/2021 due to chronic A. fib with RVR, with medical history significant for essential hypertension, hypothyroidism, type 2 diabetes mellitus, chronic atrial fibrillation, status post bilateral AKA, history of polysubstance abuse (alcohol, tobacco and cocaine) who presents to [facility] ED from [facility] via EMS due to shortness of breath.  He was noted to be hypoxic, in the 80% on room air at the SNF, neb treatment was provided and patient was placed on a supplemental oxygen via [place] at 2 LPM with minimal improvement.  EMS was activated and on arrival, O2 supplementation was increased to 4 LPM en route to the hospital.   Work-up revealed acute hypoxic respiratory failure, right-sided community-acquired pneumonia, mild pulmonary edema, elevated troponin, elevated BNP greater than 800, acute on chronic diastolic CHF, AKI, non-anion gap metabolic acidosis, and electrolyte abnormalities.  Echocardiogram revealed LV systolic dysfunction and monitoring detected RVR. Lasix was given with subsequently increasing creatinine. Diuresis pushed despite renal impairment due to progressive pulmonary edema on CXR with hypoxia.   Assessment & Plan: Principal Problem:   CAP (community acquired pneumonia) Active Problems:   Essential hypertension   Acquired hypothyroidism   Elevated troponin   Acute respiratory failure with hypoxia (HCC)   Hyperglycemia due to diabetes mellitus (HCC)   Leukocytosis   Reactive thrombocytosis   S/P AKA (above knee amputation) bilateral (HCC)   AKI (acute kidney injury) ([place])   Atrial fibrillation, chronic (HCC)   Hypomagnesemia   Elevated brain natriuretic peptide (BNP) level   Polysubstance abuse (HCC)   Noncompliance with medication regimen   Chronic diastolic CHF (congestive  heart failure) (HCC)   Hypoalbuminemia due to protein-calorie malnutrition (HCC)   GERD (gastroesophageal reflux disease)  Acute hypoxic respiratory failure: Presumed to be due to decompensated CHF (primary component) and right-sided pneumonia, COPD exacerbation.  - Hypoxia seems to recur when sleeping, so possibly an element of OSA. Clubbing also noted suggestive of a more long term possibly occult hypoxemia. May need supplemental oxygen at discharge and evaluation for OSA as outpatient. -Not on home oxygen prior to admission.  Try to wean oxygen to room air as tolerated.  Currently saturating in high 90s, unclear how much oxygen he is on. - Had worsened dyspnea, hypoxia and chest x-ray findings 11/23.  Reds vest reading 53 consistent with significant volume overload.  ESR >140 but chronically elevated.  S/p IV Lasix 40 mg x 1 followed by 80 Mg x1 on 11/24 good diuresis and improvement. - Two-view chest x-ray 11/24 personally reviewed.  Significantly improved bilateral airspace disease.  NSVT: Suspected ischemic etiology per cardiology.  - Will require work up (cMRI, LHC) though renal impairment is currently prohibitive.  - Continue amiodarone loading. Continue metoprolol - Continue cardiac monitoring.  - Keep K >4, Mg >2. Recheck daily.  PAF, multifocal atrial tachycardia: Recent admit for RVR.  - Continue metoprolol succinate, increased to 161m.  Also on amiodarone.  Now in sinus rhythm for the most part. - Continue eliquis for anticoagulation - Maintain K >4, Mg >2.   Acute on chronic HFrEF, HTN: LVEF 45-50% without RWMA, +LVH, normal IVC. Severe LAE, moderate RAE. BNP elevated.  - Cardiology consulted, deferring ischemic eval pending improvement in AKI. -Continue IV Lasix 40 mg twice daily, Toprol-XL 100 Mg daily. - GDMT limited due to AKI.  AKI complicating CKD stage  IIIa, with NAGMA: No abnormality on U/S, though did have trace perinephric fluid on right. Bland sediment with  definite proteinuria 1.03. FENa not consistent with prerenal azotemia at 1.78%. Note history of worse AKIs previously.  -Initial IV Lasix for decompensated CHF, then held for AKI, then resumed again for volume overload and creatinine which had gradually improved to 2.1 to now back up to 2.46. - Continue bicarbonate tabs, even at discharge as per nephrology.  - Nephrology following.  Demand myocardial ischemia: Mild troponin elevation with reassuring delta no anginal complaints.  - Defer w/u to cardiology-eventual plans to get ischemic evaluation once creatinine improves.  Right-sided CAP: Completed azithromycin x5 days. Stopped vanc w/nephrotoxicity and negative MRSA PCR. Completed 7 days of zosyn. Blood Cx's NGTD x5 days, no sputum culture obtained.  COPD with exacerbation:  - Continue inhaled BDs, avoiding steroids due to severe hyperglycemia. Exacerbation has resolved.  Uncontrolled T2DM with steroid-induced hyperglycemia: HbA1c 7.9%.  -Tightly controlled CBGs and a couple of hypoglycemic episodes 11/24. - DC mealtime NovoLog.  Continue SSI for now.  Treat per hypoglycemia protocol.  Polysubstance abuse: Previously +alcohol, tobacco, cocaine.  - Cessation counseling.  - Continue nicotine patch.   Depression:  - Restarted SSRI. Recommend pursuing outpatient therapy. No indication for inpatient psychiatry consultation at this time.  Hypocalcemia:  - Continue supplementation. Corrected value (albumin 1.8) = 8.8. - Vitamin D 29.7. Supplement.  Hypothyroidism: Repeat TSH is 0.315, last was 20.52 in September 2022 which was improved from 189 in July in setting of medication nonadherence. Free T4 is 1.12 which is the ULN.  - In setting of AFib/MAT, we did decrease synthroid dose 156mg > 1066m. - Recommend repeating full TFTs in 4 weeks.  Anemia of chronic disease: Having a decline in hgb from admission, though all cell lines are down and hgb was above baseline on admission.  -  Hemoglobin has stabilized somewhat near baseline ~9g/dl   HTN: Tx as above  PVD s/p bilateral AKA (2018-2019):  - Continue statin, anticoagulation  GERD:  - Continue PPI  Suspected malnutrition with hypoalbuminemia. - Dietitian consulted and as per their note 1146/27no cords applicable..   DVT prophylaxis: Eliquis Code Status: Full Family Communication: Discussed with patient's 2 sisters via phone, updated care and answered questions. Disposition Plan:  Status is: Inpatient  Remains inpatient appropriate because: Has new finding of worsening chest x-ray, persistent hypoxia that needs further evaluation.  Consultants:  Cardiology Nephrology  Procedures:  Echocardiogram  Antimicrobials: Ceftriaxone, azithromycin > vancomycin, zosyn.  Completed course.  Subjective: Breathing improved.  No other complaints reported.  Objective: Vitals:   05/04/21 1935 05/05/21 1013 05/05/21 1529 05/05/21 2046  BP: 131/70  131/84 126/70  Pulse: 71  71 72  Resp: 16  17 16   Temp: 9803.5 (36.9 C) 97.8 F (36.6 C) 97.8 F (36.6 C)   TempSrc: Oral Oral Oral   SpO2: 93%  97% 99%  Weight:      Height:        Intake/Output Summary (Last 24 hours) at 05/06/2021 1351 Last data filed at 05/06/2021 0008 Gross per 24 hour  Intake --  Output 721 ml  Net -721 ml    Filed Weights   05/02/21 0548 05/03/21 0438 05/04/21 0500  Weight: 67.1 kg 66.1 kg 66.5 kg   Gen: [age].o. male, moderately built and poorly nourished lying comfortably propped up in bed without distress. Pulm: Clear to auscultation anteriorly.  Slightly diminished in the bases with occasional basal crackles.  Rest clear.  No increased work of breathing. CV: S1 and S2 heard, RRR.  JVD +.  Bilateral AKA's unable to appreciate pedal edema.  No thigh edema.  Telemetry personally reviewed: Sinus rhythm.  3-4 beats NSVT. GI: Abdomen soft, non-tender, non-distended, with normoactive bowel sounds.  Ext: Warm, dry, BL AKA.  Right  stump with dressing clean and dry. Skin: No new rashes, lesions or ulcers on visualized skin. Neuro: Alert and oriented. No focal neurological deficits. Psych: Judgement and insight appear fair. Mood euthymic & affect congruent. Behavior is appropriate.    Data Reviewed: I have personally reviewed following labs and imaging studies  CBC: Recent Labs  Lab 05/04/21 0204 05/06/21 0154  WBC 14.6* 13.1*  NEUTROABS 10.7*  --   HGB 8.7* 9.4*  HCT 27.6* 28.9*  MCV 88.2 86.8  PLT 482* 341*   Basic Metabolic Panel: Recent Labs  Lab 04/30/21 0157 05/01/21 0136 05/02/21 0206 05/03/21 0100 05/04/21 0204 05/05/21 0234 05/06/21 0154  NA 136 139 139 138 140 139 142  K 4.3 4.1 3.8 4.0 3.5 3.5 4.3  CL 107 111 109 109 110 112* 111  CO2 18* 18* 19* 20* 18* 19* 20*  GLUCOSE 258* 173* 146* 159* 174* 97 97  BUN 40* 35* 31* 26* 25* 24* 24*  CREATININE 2.84* 2.62* 2.30* 2.21* 2.14* 2.12* 2.46*  CALCIUM 6.4* 7.0* 6.9* 7.0* 6.7* 6.9* 7.3*  MG 2.0  --   --   --  1.8 1.8 1.8  PHOS  --  3.1 3.4 5.1* 5.1* 5.1*  --    GFR: Estimated Creatinine Clearance: 27.8 mL/min (A) (by C-G formula based on SCr of 2.46 mg/dL (H)). Liver Function Tests: Recent Labs  Lab 05/01/21 0136 05/02/21 0206 05/03/21 0100 05/04/21 0204 05/05/21 0234  ALBUMIN 2.0* 1.8* 1.8* 1.8* 1.7*   CBG: Recent Labs  Lab 05/05/21 2141 05/06/21 0755 05/06/21 1159 05/06/21 1222 05/06/21 1259  GLUCAP 95 170* 66* 66* 97   Urine analysis:    Component Value Date/Time   COLORURINE YELLOW 04/24/2021 [place] 04/24/2021 0446   APPEARANCEUR Clear 02/09/2018 1352   LABSPEC 1.018 04/24/2021 0446   PHURINE 5.0 04/24/2021 [place] 04/24/2021 0446   HGBUR NEGATIVE 04/24/2021 0446   BILIRUBINUR NEGATIVE 04/24/2021 0446   BILIRUBINUR Negative 02/09/2018 1352   KETONESUR NEGATIVE 04/24/2021 0446   PROTEINUR 100 (A) 04/24/2021 0446   NITRITE NEGATIVE 04/24/2021 0446   LEUKOCYTESUR NEGATIVE  04/24/2021 0446   Recent Results (from the past 240 hour(s))  SARS CORONAVIRUS 2 (TAT 6-24 HRS) Nasopharyngeal Nasopharyngeal Swab     Status: None   Collection Time: 05/04/21  4:46 PM   Specimen: Nasopharyngeal Swab  Result Value Ref Range Status   SARS Coronavirus 2 NEGATIVE NEGATIVE Final    Comment: (NOTE) SARS-CoV-2 target nucleic acids are NOT DETECTED.  The SARS-CoV-2 RNA is generally detectable in upper and lower respiratory specimens during the acute phase of infection. Negative results do not preclude SARS-CoV-2 infection, do not rule out co-infections with other pathogens, and should not be used as the sole basis for treatment or other patient management decisions. Negative results must be combined with clinical observations, patient history, and epidemiological information. The expected result is Negative.  Fact Sheet for Patients: SugarRoll.be  Fact Sheet for Healthcare Providers: https://www.woods-mathews.com/  This test is not yet approved or cleared by the [place] FDA and  has been authorized for detection and/or diagnosis of SARS-CoV-2 by FDA under an Emergency  Use Authorization (EUA). This EUA will remain  in effect (meaning this test can be used) for the duration of the COVID-19 declaration under Se ction 564(b)(1) of the Act, 21 U.S.C. section 360bbb-3(b)(1), unless the authorization is terminated or revoked sooner.  Performed at [facility], [place] [address], [place], [place] [address]        Radiology Studies: DG Chest 2 View  Result Date: 05/06/2021 CLINICAL DATA:  Shortness of breath EXAM: CHEST - 2 VIEW COMPARISON:  Yesterday FINDINGS: Indistinct bilateral airspace disease. Improved aeration from yesterday. Trace pleural fluid. No pneumothorax. Postoperative thoracic inlet. Normal heart size. IMPRESSION: Improving bilateral airspace disease. Electronically Signed   By: Jorje Guild M.D.   On:  05/06/2021 06:22   DG CHEST PORT 1 VIEW  Result Date: 05/05/2021 CLINICAL DATA:  Shortness of breath evaluation. EXAM: PORTABLE CHEST 1 VIEW COMPARISON:  Radiographs 04/28/2021 and 04/26/2021. FINDINGS: 1053 hours. The heart size and mediastinal contours are stable. There are surgical clips at the thoracic inlet. There are significantly worsening diffuse nodular airspace opacities in both lungs which had improved on the most recent study of 1 week ago. No pneumothorax or significant pleural effusion. The bones appear stable. Telemetry leads overlie the chest. IMPRESSION: Recurrent bilateral airspace opacities suspicious for recurrent multilobar pneumonia. Given fluctuation on recent radiographs, considerations would include pulmonary hemorrhage and atypical inflammation. Electronically Signed   By: Richardean Sale M.D.   On: 05/05/2021 11:06    Scheduled Meds:  amiodarone  400 mg Oral BID   apixaban  5 mg Oral BID   atorvastatin  40 mg Oral Daily   calcium carbonate  1,250 mg Oral QHS   cholecalciferol  1,000 Units Oral Daily   feeding supplement (GLUCERNA SHAKE)  237 mL Oral TID BM   folic acid  1 mg Oral Daily   furosemide  40 mg Intravenous BID   insulin aspart  0-5 Units Subcutaneous QHS   insulin aspart  0-9 Units Subcutaneous TID WC   insulin aspart  3 Units Subcutaneous TID WC   isosorbide dinitrate  20 mg Oral BID   levothyroxine  100 mcg Oral QAC breakfast   metoprolol succinate  100 mg Oral Daily   multivitamin with minerals  1 tablet Oral Daily   pantoprazole  40 mg Oral BID   sertraline  50 mg Oral QHS   sodium bicarbonate  1,300 mg Oral TID   thiamine  100 mg Oral Daily   Continuous Infusions:  sodium chloride Stopped (04/20/21 0916)     LOS: 16 days   Time spent: 25 minutes.  Vernell Leep, MD,  FACP, [facility], [facility], [facility] (Care Management Physician Certified) [facility] [place]  To contact the attending provider between 7A-7P or the  covering provider during after hours 7P-7A, please log into the web site [email or web address] and access using universal [place] password for that web site. If you do not have the password, please call the hospital operator.

## 2021-05-06 NOTE — Plan of Care (Signed)
  Problem: Education: Goal: Knowledge of General Education information will improve Description: Including pain rating scale, medication(s)/side effects and non-pharmacologic comfort measures Outcome: Progressing   Problem: Health Behavior/Discharge Planning: Goal: Ability to manage health-related needs will improve Outcome: Progressing   Problem: Clinical Measurements: Goal: Ability to maintain clinical measurements within normal limits will improve Outcome: Progressing Goal: Will remain free from infection Outcome: Progressing Goal: Diagnostic test results will improve Outcome: Progressing Goal: Respiratory complications will improve Outcome: Progressing Goal: Cardiovascular complication will be avoided Outcome: Progressing   Problem: Activity: Goal: Risk for activity intolerance will decrease Outcome: Progressing   Problem: Elimination: Goal: Will not experience complications related to urinary retention Outcome: Progressing   Problem: Pain Managment: Goal: General experience of comfort will improve Outcome: Progressing   Problem: Safety: Goal: Ability to remain free from injury will improve Outcome: Progressing   Problem: Skin Integrity: Goal: Risk for impaired skin integrity will decrease Outcome: Progressing

## 2021-05-06 NOTE — Progress Notes (Addendum)
Patient ID: Todd [name], male   DOB: 08/04/1955, 65 y.o.   MRN: [id number] [facility] Progress Note   Assessment/ Plan:   1. Acute kidney Injury on chronic kidney disease stage IIIa: Urine output not charted from overnight with continued but slow improvement of BUN/creatinine, etiology suspected to be hemodynamic with intravenous diuresis/arrhythmias and relative hypotension.  Restarted back on furosemide yesterday because of volume overload and has a corresponding rise in creatinine noted on labs this morning.  Ordered for furosemide 40 mg IV twice daily. 2.  Acute hypoxic respiratory failure: Secondary to pneumonia and congestive heart failure-on diuretics transiently held after developing worsening renal function and he is status post azithromycin and Zosyn. 3.  Anion gap metabolic acidosis: Secondary to acute kidney injury, currently on sodium bicarbonate supplementation and I will continue to follow his labs to decide on titration versus discontinuation. 4.  Anemia: Likely secondary to acute/critical illness, hemoglobin/hematocrit improving on labs this morning. 5.  History of polysubstance abuse  Subjective:   With increased shortness of breath yesterday and chest x-ray showing atypical pneumonia versus pulmonary edema with corroborating ReDS reading showing volume overload.  He currently denies any shortness of breath at rest.   Objective:   BP 126/70 (BP Location: Left Arm)   Pulse 72   Temp 97.8 F (36.6 C) (Oral)   Resp 16   Ht 5\' 8"  (1.727 m)   Wt 66.5 kg   SpO2 99%   BMI 22.29 kg/m   Intake/Output Summary (Last 24 hours) at 05/06/2021 0834 Last data filed at 05/06/2021 0008 Gross per 24 hour  Intake --  Output 1621 ml  Net -1621 ml    Weight change:   Physical Exam: Gen: Comfortably resting in bed, on oxygen via nasal cannula CVS: Pulse regular rhythm, normal rate, S1 and S2 normal Resp: Clear to auscultation bilaterally, no distinct rales or  rhonchi Abd: Soft, flat, nontender Ext: Status post bilateral AKA with protective dressing over right stump.  No edema  Imaging: DG Chest 2 View  Result Date: 05/06/2021 CLINICAL DATA:  Shortness of breath EXAM: CHEST - 2 VIEW COMPARISON:  Yesterday FINDINGS: Indistinct bilateral airspace disease. Improved aeration from yesterday. Trace pleural fluid. No pneumothorax. Postoperative thoracic inlet. Normal heart size. IMPRESSION: Improving bilateral airspace disease. Electronically Signed   By: Jorje Guild M.D.   On: 05/06/2021 06:22   DG CHEST PORT 1 VIEW  Result Date: 05/05/2021 CLINICAL DATA:  Shortness of breath evaluation. EXAM: PORTABLE CHEST 1 VIEW COMPARISON:  Radiographs 04/28/2021 and 04/26/2021. FINDINGS: 1053 hours. The heart size and mediastinal contours are stable. There are surgical clips at the thoracic inlet. There are significantly worsening diffuse nodular airspace opacities in both lungs which had improved on the most recent study of 1 week ago. No pneumothorax or significant pleural effusion. The bones appear stable. Telemetry leads overlie the chest. IMPRESSION: Recurrent bilateral airspace opacities suspicious for recurrent multilobar pneumonia. Given fluctuation on recent radiographs, considerations would include pulmonary hemorrhage and atypical inflammation. Electronically Signed   By: Richardean Sale M.D.   On: 05/05/2021 11:06    Labs: BMET Recent Labs  Lab 04/30/21 0157 05/01/21 0136 05/02/21 0206 05/03/21 0100 05/04/21 0204 05/05/21 0234 05/06/21 0154  NA 136 139 139 138 140 139 142  K 4.3 4.1 3.8 4.0 3.5 3.5 4.3  CL 107 111 109 109 110 112* 111  CO2 18* 18* 19* 20* 18* 19* 20*  GLUCOSE 258* 173* 146* 159* 174* 97 97  BUN 40*  35* 31* 26* 25* 24* 24*  CREATININE 2.84* 2.62* 2.30* 2.21* 2.14* 2.12* 2.46*  CALCIUM 6.4* 7.0* 6.9* 7.0* 6.7* 6.9* 7.3*  PHOS  --  3.1 3.4 5.1* 5.1* 5.1*  --    CBC Recent Labs  Lab 05/04/21 0204 05/06/21 0154  WBC 14.6*  13.1*  NEUTROABS 10.7*  --   HGB 8.7* 9.4*  HCT 27.6* 28.9*  MCV 88.2 86.8  PLT 482* 519*    Medications:     amiodarone  400 mg Oral BID   apixaban  5 mg Oral BID   atorvastatin  40 mg Oral Daily   calcium carbonate  1,250 mg Oral QHS   cholecalciferol  1,000 Units Oral Daily   feeding supplement (GLUCERNA SHAKE)  237 mL Oral TID BM   folic acid  1 mg Oral Daily   furosemide  40 mg Intravenous BID   insulin aspart  0-5 Units Subcutaneous QHS   insulin aspart  0-9 Units Subcutaneous TID WC   insulin aspart  3 Units Subcutaneous TID WC   isosorbide dinitrate  20 mg Oral BID   levothyroxine  100 mcg Oral QAC breakfast   metoprolol succinate  100 mg Oral Daily   multivitamin with minerals  1 tablet Oral Daily   pantoprazole  40 mg Oral BID   sertraline  50 mg Oral QHS   sodium bicarbonate  1,300 mg Oral TID   thiamine  100 mg Oral Daily   Elmarie Shiley, MD 05/06/2021, 8:34 AM

## 2021-05-07 DIAGNOSIS — R0602 Shortness of breath: Secondary | ICD-10-CM

## 2021-05-07 DIAGNOSIS — I482 Chronic atrial fibrillation, unspecified: Secondary | ICD-10-CM | POA: Diagnosis not present

## 2021-05-07 LAB — BASIC METABOLIC PANEL
Anion gap: 11 (ref 5–15)
BUN: 29 mg/dL — ABNORMAL HIGH (ref 8–23)
CO2: 22 mmol/L (ref 22–32)
Calcium: 7 mg/dL — ABNORMAL LOW (ref 8.9–10.3)
Chloride: 106 mmol/L (ref 98–111)
Creatinine, Ser: 2.62 mg/dL — ABNORMAL HIGH (ref 0.61–1.24)
GFR, Estimated: 26 mL/min — ABNORMAL LOW (ref >60)
Glucose, Bld: 98 mg/dL (ref 70–99)
Potassium: 3.3 mmol/L — ABNORMAL LOW (ref 3.5–5.1)
Sodium: 139 mmol/L (ref 135–145)

## 2021-05-07 LAB — GLUCOSE, CAPILLARY
Glucose-Capillary: 106 mg/dL — ABNORMAL HIGH (ref 70–99)
Glucose-Capillary: 129 mg/dL — ABNORMAL HIGH (ref 70–99)
Glucose-Capillary: 124 mg/dL — ABNORMAL HIGH (ref 70–99)
Glucose-Capillary: 128 mg/dL — ABNORMAL HIGH (ref 70–99)

## 2021-05-07 MED ORDER — POTASSIUM CHLORIDE CRYS ER 20 MEQ PO TBCR
40.0000 meq | EXTENDED_RELEASE_TABLET | Freq: Once | ORAL | Status: AC
  Administered 2021-05-07: 40 meq via ORAL
  Filled 2021-05-07: qty 2

## 2021-05-07 MED ORDER — ALBUMIN HUMAN 25 % IV SOLN
12.5000 g | Freq: Once | INTRAVENOUS | Status: AC
Start: 2021-05-07 — End: 2021-05-07
  Administered 2021-05-07: 12.5 g via INTRAVENOUS
  Filled 2021-05-07: qty 50

## 2021-05-07 MED ORDER — FUROSEMIDE 10 MG/ML IJ SOLN
40.0000 mg | Freq: Once | INTRAMUSCULAR | Status: AC
  Administered 2021-05-07: 40 mg via INTRAVENOUS
  Filled 2021-05-07: qty 4

## 2021-05-07 MED ORDER — FUROSEMIDE 10 MG/ML IJ SOLN
40.0000 mg | Freq: Once | INTRAMUSCULAR | Status: AC
  Administered 2021-05-08: 40 mg via INTRAVENOUS
  Filled 2021-05-07: qty 4

## 2021-05-07 MED ORDER — ALBUMIN HUMAN 25 % IV SOLN
12.5000 g | Freq: Once | INTRAVENOUS | Status: AC
  Administered 2021-05-08: 12.5 g via INTRAVENOUS
  Filled 2021-05-07: qty 50

## 2021-05-07 NOTE — Progress Notes (Signed)
REDS Clip  READING= 46>47>45% when repeated (volume overloaded)  CHEST RULER = [place] = C  Antonietta Jewel, PharmD, BCCCP Clinical Pharmacist  Phone: [phone] 05/07/2021 12:56 PM  Please check AMION for all [place] phone numbers After 10:00 PM, call [place] [phone]

## 2021-05-07 NOTE — Progress Notes (Signed)
Patient ID: Todd [name], male   DOB: 1955/07/25, 65 y.o.   MRN: [id number] [facility] Progress Note   Assessment/ Plan:   1. Acute kidney Injury on chronic kidney disease stage IIIa: Urine output not charted from overnight with continued but slow improvement of BUN/creatinine, etiology suspected to be hemodynamic with intravenous diuresis/arrhythmias and relative hypotension.  Restarted back on furosemide due to evidence of volume overload with corresponding dyspnea/hypoxia.  Symptomatically improving and plans noted for repeat volumetric assessment using ReDS given limitations of his physical exam. 2.  Acute hypoxic respiratory failure: Secondary to pneumonia and congestive heart failure-on diuretics transiently held after developing worsening renal function and he is status post azithromycin and Zosyn. 3.  Anion gap metabolic acidosis: Secondary to acute kidney injury, currently on sodium bicarbonate supplementation and I will continue to follow his labs to decide on titration versus discontinuation. 4.  Anemia: Likely secondary to acute/critical illness, hemoglobin/hematocrit improving on labs this morning. 5.  History of polysubstance abuse 6.  Hypokalemia: Secondary to losses from ongoing diuresis and will be replaced via oral route  Subjective:   Reports to be feeling better this morning with decreased shortness of breath status post diuresis.  Discussed with Dr. Harrington Challenger and plans noted for repeat ReDS reading.   Objective:   BP 127/64 (BP Location: Left Arm)   Pulse (!) 58   Temp 98.8 F (37.1 C) (Oral)   Resp 15   Ht 5\' 8"  (1.727 m)   Wt 56.2 kg   SpO2 97%   BMI 18.82 kg/m   Intake/Output Summary (Last 24 hours) at 05/07/2021 0932 Last data filed at 05/07/2021 0555 Gross per 24 hour  Intake --  Output 1750 ml  Net -1750 ml    Weight change:   Physical Exam: Gen: Comfortably sitting up in bed without any supplemental oxygen CVS: Pulse regular rhythm,  normal rate, S1 and S2 normal Resp: Distant breath sounds bilaterally without rales/rhonchi Abd: Soft, flat, nontender Ext: Status post bilateral AKA with protective dressing over right stump.  No edema  Imaging: DG Chest 2 View  Result Date: 05/06/2021 CLINICAL DATA:  Shortness of breath EXAM: CHEST - 2 VIEW COMPARISON:  Yesterday FINDINGS: Indistinct bilateral airspace disease. Improved aeration from yesterday. Trace pleural fluid. No pneumothorax. Postoperative thoracic inlet. Normal heart size. IMPRESSION: Improving bilateral airspace disease. Electronically Signed   By: Jorje Guild M.D.   On: 05/06/2021 06:22   DG CHEST PORT 1 VIEW  Result Date: 05/05/2021 CLINICAL DATA:  Shortness of breath evaluation. EXAM: PORTABLE CHEST 1 VIEW COMPARISON:  Radiographs 04/28/2021 and 04/26/2021. FINDINGS: 1053 hours. The heart size and mediastinal contours are stable. There are surgical clips at the thoracic inlet. There are significantly worsening diffuse nodular airspace opacities in both lungs which had improved on the most recent study of 1 week ago. No pneumothorax or significant pleural effusion. The bones appear stable. Telemetry leads overlie the chest. IMPRESSION: Recurrent bilateral airspace opacities suspicious for recurrent multilobar pneumonia. Given fluctuation on recent radiographs, considerations would include pulmonary hemorrhage and atypical inflammation. Electronically Signed   By: Richardean Sale M.D.   On: 05/05/2021 11:06    Labs: BMET Recent Labs  Lab 05/01/21 0136 05/02/21 0206 05/03/21 0100 05/04/21 0204 05/05/21 0234 05/06/21 0154 05/07/21 0233  NA 139 139 138 140 139 142 139  K 4.1 3.8 4.0 3.5 3.5 4.3 3.3*  CL 111 109 109 110 112* 111 106  CO2 18* 19* 20* 18* 19* 20* 22  GLUCOSE  173* 146* 159* 174* 97 97 98  BUN 35* 31* 26* 25* 24* 24* 29*  CREATININE 2.62* 2.30* 2.21* 2.14* 2.12* 2.46* 2.62*  CALCIUM 7.0* 6.9* 7.0* 6.7* 6.9* 7.3* 7.0*  PHOS 3.1 3.4 5.1* 5.1*  5.1*  --   --    CBC Recent Labs  Lab 05/04/21 0204 05/06/21 0154  WBC 14.6* 13.1*  NEUTROABS 10.7*  --   HGB 8.7* 9.4*  HCT 27.6* 28.9*  MCV 88.2 86.8  PLT 482* 519*    Medications:     amiodarone  400 mg Oral BID   apixaban  5 mg Oral BID   atorvastatin  40 mg Oral Daily   calcium carbonate  1,250 mg Oral QHS   cholecalciferol  1,000 Units Oral Daily   feeding supplement (GLUCERNA SHAKE)  237 mL Oral TID BM   folic acid  1 mg Oral Daily   furosemide  40 mg Intravenous BID   insulin aspart  0-5 Units Subcutaneous QHS   insulin aspart  0-9 Units Subcutaneous TID WC   isosorbide dinitrate  20 mg Oral BID   levothyroxine  100 mcg Oral QAC breakfast   metoprolol succinate  100 mg Oral Daily   multivitamin with minerals  1 tablet Oral Daily   pantoprazole  40 mg Oral BID   sertraline  50 mg Oral QHS   sodium bicarbonate  1,300 mg Oral TID   thiamine  100 mg Oral Daily   Elmarie Shiley, MD 05/07/2021, 9:32 AM

## 2021-05-07 NOTE — Progress Notes (Signed)
PROGRESS NOTE  Todd [name]  BCW:[id number] DOB: December 16, 1955 DOA: 04/19/2021 PCP: [name], FNP  Brief Narrative: Todd [name] is a 65 y.o. male recently admitted from 01/10/2021 to 01/15/2021 due to chronic A. fib with RVR, with medical history significant for essential hypertension, hypothyroidism, type 2 diabetes mellitus, chronic atrial fibrillation, status post bilateral AKA, history of polysubstance abuse (alcohol, tobacco and cocaine) who presents to [facility] ED from [facility] via EMS due to shortness of breath.  He was noted to be hypoxic, in the 80% on room air at the SNF. Work-up revealed acute hypoxic respiratory failure, right-sided community-acquired pneumonia, elevated troponin, acute on chronic diastolic CHF, AKI, non-anion gap metabolic acidosis, and electrolyte abnormalities.Echocardiogram revealed LV systolic dysfunction and monitoring detected RVR.  Course complicated by sustained VT, Cardiology consulted, ischemic work-up precluded by development of AKI for which nephrology consulted, diuretics were held, then he developed worsening dyspnea/hypoxia and IV Lasix has been resumed.    Assessment & Plan: Principal Problem:   CAP (community acquired pneumonia) Active Problems:   Essential hypertension   Acquired hypothyroidism   Elevated troponin   Acute respiratory failure with hypoxia (HCC)   Hyperglycemia due to diabetes mellitus (HCC)   Leukocytosis   Reactive thrombocytosis   S/P AKA (above knee amputation) bilateral (HCC)   AKI (acute kidney injury) ([place])   Atrial fibrillation, chronic (HCC)   Hypomagnesemia   Elevated brain natriuretic peptide (BNP) level   Polysubstance abuse (HCC)   Noncompliance with medication regimen   Chronic diastolic CHF (congestive heart failure) (HCC)   Hypoalbuminemia due to protein-calorie malnutrition (HCC)   GERD (gastroesophageal reflux disease)  Acute hypoxic respiratory failure: Presumed to be due to decompensated CHF  (primary component), right-sided pneumonia, and COPD exacerbation.  -There was concern about hypoxia while sleeping but overnight 11/24 had oxygen saturations in the high 90s on room air. - Not on home oxygen prior to admission.  Hypoxia now resolved and patient has been weaned off to room air on 11/24. - Two-view chest x-ray 11/24 personally reviewed.  Significantly improved bilateral airspace disease.  Recommend follow-up chest x-ray in 4 weeks.  Sustained ventricular tachycardia: Suspected ischemic etiology per cardiology.  - Will require work up (cMRI, LHC) though renal impairment is currently prohibitive.  - Continue amiodarone loading and metoprolol - Continue cardiac monitoring.  - Keep K >4, Mg >2.  Had 9 beat NSVT on 11/25.  PAF, multifocal atrial tachycardia: Recent admit for RVR.  - Continue metoprolol succinate, increased to 100mg .  Also on amiodarone loading.  Now in sinus rhythm for the most part. - Continue eliquis for anticoagulation - Maintain K >4, Mg >2.   Acute on chronic HFrEF, HTN: LVEF 45-50% without RWMA, +LVH, normal IVC. Severe LAE, moderate RAE. BNP elevated.  - Cardiology consulted, deferring ischemic eval pending improvement in AKI. -Continue IV Lasix 40 mg twice daily, Toprol-XL 100 Mg daily. - GDMT limited due to AKI. -10.4 L thus far.  Weight down from 150 pounds on 11/8 to 123.8 pounds on 11/25. - As per cardiology, ReDs reading at 43, suggesting persisting volume overload, continuing IV Lasix per nephrology.  AKI complicating CKD stage IIIa, with NAGMA: No abnormality on U/S, though did have trace perinephric fluid on right. Bland sediment with definite proteinuria 1.03. FENa not consistent with prerenal azotemia at 1.78%. Note history of worse AKIs previously.  -Initial IV Lasix for decompensated CHF, then held for AKI, then resumed again for volume overload.  Creatinine expectedly rising while  on IV Lasix and nephrology following. - Continue bicarbonate  tabs, even at discharge as per nephrology.   Demand myocardial ischemia: Mild troponin elevation with reassuring delta no anginal complaints.  - Defer w/u to cardiology-eventual plans to get ischemic evaluation once creatinine improves.  Right-sided CAP: Completed azithromycin x5 days. Stopped vanc w/nephrotoxicity and negative MRSA PCR. Completed 7 days of zosyn. Blood Cx's NGTD x5 days, no sputum culture obtained.  Recommend repeating chest x-ray in 4 weeks.  COPD with exacerbation:  - Continue inhaled BDs, avoiding steroids due to severe hyperglycemia. Exacerbation has resolved.  Uncontrolled T2DM with steroid-induced hyperglycemia: HbA1c 7.9%.  -Tightly controlled CBGs and a couple of hypoglycemic episodes 11/24. - DC mealtime NovoLog.  Continue SSI for now.  Treat per hypoglycemia protocol. - No further hypoglycemic episodes thus far.  Polysubstance abuse: Previously +alcohol, tobacco, cocaine.  - Cessation counseling.  - Continue nicotine patch.   Depression:  -As per family, patient reportedly was quite emotional and tearful a few days ago and restarted SSRI.  Improved.  Recommend pursuing outpatient therapy. No indication for inpatient psychiatry consultation at this time.  Hypocalcemia:  - Continue supplementation. Corrected value (albumin 1.8) = 8.8. - Vitamin D 29.7. Supplement.  Hypothyroidism: Repeat TSH is 0.315, last was 20.52 in September 2022 which was improved from 189 in July in setting of medication nonadherence. Free T4 is 1.12 which is the ULN.  - In setting of AFib/MAT, we did decrease synthroid dose 187mcg > 169mcg. - Recommend repeating full TFTs in 4 weeks.  Anemia of chronic disease: Having a decline in hgb from admission, though all cell lines are down and hgb was above baseline on admission.  - Hemoglobin has stabilized somewhat near baseline ~9g/dl   HTN: Tx as above  PVD s/p bilateral AKA (2018-2019):  - Continue statin, anticoagulation  GERD:  -  Continue PPI  Suspected malnutrition with hypoalbuminemia. - Dietitian consulted and as per their note 62/94, no cords applicable..   DVT prophylaxis: Eliquis Code Status: Full Family Communication: Discussed with patient's 2 sisters via phone 11/24, updated care and answered questions.  None at bedside. Disposition Plan:  Status is: Inpatient  Remains inpatient appropriate because: Has new finding of worsening chest x-ray, persistent hypoxia that needs further evaluation.  Consultants:  Cardiology Nephrology  Procedures:  Echocardiogram  Antimicrobials: Ceftriaxone, azithromycin > vancomycin, zosyn.  Completed course.  Subjective: Denies dyspnea.  As per nursing, patient has not been on oxygen since 11/24.  No acute issues reported by nursing.  Objective: Vitals:   05/05/21 2046 05/06/21 1419 05/06/21 2047 05/07/21 0550  BP: 126/70 117/67 119/64 127/64  Pulse: 72 76 69 (!) 58  Resp: 16 18 (!) 22 15  Temp:   98.8 F (37.1 C) 98.8 F (37.1 C)  TempSrc:   Oral Oral  SpO2: 99% 100% 96% 97%  Weight:    56.2 kg  Height:        Intake/Output Summary (Last 24 hours) at 05/07/2021 1456 Last data filed at 05/07/2021 0555 Gross per 24 hour  Intake --  Output 1750 ml  Net -1750 ml    Filed Weights   05/03/21 0438 05/04/21 0500 05/07/21 0550  Weight: 66.1 kg 66.5 kg 56.2 kg   Gen: 65 y.o. male, moderately built and poorly nourished lying comfortably propped up in bed without distress. Pulm: Clear to auscultation.  No increased work of breathing. CV: S1 and S2 heard, RRR.  No JVD, murmurs or pedal edema.  Telemetry personally reviewed:  9 beat NSVT at 8:21 AM on 11/25 GI: Abdomen soft, non-tender, non-distended, with normoactive bowel sounds.  Ext: Warm, dry, BL AKA.  Right stump with dressing clean and dry. Skin: No new rashes, lesions or ulcers on visualized skin. Neuro: Alert and oriented. No focal neurological deficits. Psych: Judgement and insight appear fair. Mood  euthymic & affect congruent. Behavior is appropriate.    Data Reviewed: I have personally reviewed following labs and imaging studies  CBC: Recent Labs  Lab 05/04/21 0204 05/06/21 0154  WBC 14.6* 13.1*  NEUTROABS 10.7*  --   HGB 8.7* 9.4*  HCT 27.6* 28.9*  MCV 88.2 86.8  PLT 482* 852*   Basic Metabolic Panel: Recent Labs  Lab 05/01/21 0136 05/02/21 0206 05/03/21 0100 05/04/21 0204 05/05/21 0234 05/06/21 0154 05/07/21 0233  NA 139 139 138 140 139 142 139  K 4.1 3.8 4.0 3.5 3.5 4.3 3.3*  CL 111 109 109 110 112* 111 106  CO2 18* 19* 20* 18* 19* 20* 22  GLUCOSE 173* 146* 159* 174* 97 97 98  BUN 35* 31* 26* 25* 24* 24* 29*  CREATININE 2.62* 2.30* 2.21* 2.14* 2.12* 2.46* 2.62*  CALCIUM 7.0* 6.9* 7.0* 6.7* 6.9* 7.3* 7.0*  MG  --   --   --  1.8 1.8 1.8  --   PHOS 3.1 3.4 5.1* 5.1* 5.1*  --   --    GFR: Estimated Creatinine Clearance: 22 mL/min (A) (by C-G formula based on SCr of 2.62 mg/dL (H)). Liver Function Tests: Recent Labs  Lab 05/01/21 0136 05/02/21 0206 05/03/21 0100 05/04/21 0204 05/05/21 0234  ALBUMIN 2.0* 1.8* 1.8* 1.8* 1.7*   CBG: Recent Labs  Lab 05/06/21 1259 05/06/21 1549 05/06/21 2137 05/07/21 0806 05/07/21 1125  GLUCAP 97 97 157* 106* 129*   Urine analysis:    Component Value Date/Time   COLORURINE YELLOW 04/24/2021 [place] 04/24/2021 0446   APPEARANCEUR Clear 02/09/2018 1352   LABSPEC 1.018 04/24/2021 0446   PHURINE 5.0 04/24/2021 0446   GLUCOSEU NEGATIVE 04/24/2021 0446   HGBUR NEGATIVE 04/24/2021 0446   BILIRUBINUR NEGATIVE 04/24/2021 0446   BILIRUBINUR Negative 02/09/2018 1352   KETONESUR NEGATIVE 04/24/2021 0446   PROTEINUR 100 (A) 04/24/2021 0446   NITRITE NEGATIVE 04/24/2021 0446   LEUKOCYTESUR NEGATIVE 04/24/2021 0446   Recent Results (from the past 240 hour(s))  SARS CORONAVIRUS 2 (TAT 6-24 HRS) Nasopharyngeal Nasopharyngeal Swab     Status: None   Collection Time: 05/04/21  4:46 PM   Specimen:  Nasopharyngeal Swab  Result Value Ref Range Status   SARS Coronavirus 2 NEGATIVE NEGATIVE Final    Comment: (NOTE) SARS-CoV-2 target nucleic acids are NOT DETECTED.  The SARS-CoV-2 RNA is generally detectable in upper and lower respiratory specimens during the acute phase of infection. Negative results do not preclude SARS-CoV-2 infection, do not rule out co-infections with other pathogens, and should not be used as the sole basis for treatment or other patient management decisions. Negative results must be combined with clinical observations, patient history, and epidemiological information. The expected result is Negative.  Fact Sheet for Patients: SugarRoll.be  Fact Sheet for Healthcare Providers: https://www.woods-mathews.com/  This test is not yet approved or cleared by the [place] FDA and  has been authorized for detection and/or diagnosis of SARS-CoV-2 by FDA under an Emergency Use Authorization (EUA). This EUA will remain  in effect (meaning this test can be used) for the duration of the COVID-19 declaration under Se ction 564(b)(1) of the  Act, 21 U.S.C. section 360bbb-3(b)(1), unless the authorization is terminated or revoked sooner.  Performed at [facility], [place] [address], [place], [place] [address]        Radiology Studies: DG Chest 2 View  Result Date: 05/06/2021 CLINICAL DATA:  Shortness of breath EXAM: CHEST - 2 VIEW COMPARISON:  Yesterday FINDINGS: Indistinct bilateral airspace disease. Improved aeration from yesterday. Trace pleural fluid. No pneumothorax. Postoperative thoracic inlet. Normal heart size. IMPRESSION: Improving bilateral airspace disease. Electronically Signed   By: Jorje Guild M.D.   On: 05/06/2021 06:22    Scheduled Meds:  amiodarone  400 mg Oral BID   apixaban  5 mg Oral BID   atorvastatin  40 mg Oral Daily   calcium carbonate  1,250 mg Oral QHS   cholecalciferol  1,000 Units  Oral Daily   feeding supplement (GLUCERNA SHAKE)  237 mL Oral TID BM   folic acid  1 mg Oral Daily   insulin aspart  0-5 Units Subcutaneous QHS   insulin aspart  0-9 Units Subcutaneous TID WC   isosorbide dinitrate  20 mg Oral BID   levothyroxine  100 mcg Oral QAC breakfast   metoprolol succinate  100 mg Oral Daily   multivitamin with minerals  1 tablet Oral Daily   pantoprazole  40 mg Oral BID   sertraline  50 mg Oral QHS   sodium bicarbonate  1,300 mg Oral TID   thiamine  100 mg Oral Daily   Continuous Infusions:  sodium chloride Stopped (04/20/21 0916)     LOS: 17 days   Time spent: 25 minutes.  Vernell Leep, MD,  FACP, [facility], [facility], [facility] (Care Management Physician Certified) [facility] [place]  To contact the attending provider between 7A-7P or the covering provider during after hours 7P-7A, please log into the web site [email or web address] and access using universal [place] password for that web site. If you do not have the password, please call the hospital operator.

## 2021-05-07 NOTE — Progress Notes (Signed)
Progress Note  Patient Name: Todd [name] Date of Encounter: 05/07/2021  [facility] Cardiologist: Minus Breeding, MD   Subjective  Breathing is OK at rest   No CP   Inpatient Medications    Scheduled Meds:  amiodarone  400 mg Oral BID   apixaban  5 mg Oral BID   atorvastatin  40 mg Oral Daily   calcium carbonate  1,250 mg Oral QHS   cholecalciferol  1,000 Units Oral Daily   feeding supplement (GLUCERNA SHAKE)  237 mL Oral TID BM   folic acid  1 mg Oral Daily   furosemide  40 mg Intravenous BID   insulin aspart  0-5 Units Subcutaneous QHS   insulin aspart  0-9 Units Subcutaneous TID WC   isosorbide dinitrate  20 mg Oral BID   levothyroxine  100 mcg Oral QAC breakfast   metoprolol succinate  100 mg Oral Daily   multivitamin with minerals  1 tablet Oral Daily   pantoprazole  40 mg Oral BID   sertraline  50 mg Oral QHS   sodium bicarbonate  1,300 mg Oral TID   thiamine  100 mg Oral Daily   Continuous Infusions:  sodium chloride Stopped (04/20/21 0916)   PRN Meds: sodium chloride, acetaminophen, ipratropium-albuterol, nicotine   Vital Signs    Vitals:   05/05/21 2046 05/06/21 1419 05/06/21 2047 05/07/21 0550  BP: 126/70 117/67 119/64 127/64  Pulse: 72 76 69 (!) 58  Resp: 16 18 (!) 22 15  Temp:   98.8 F (37.1 C) 98.8 F (37.1 C)  TempSrc:   Oral Oral  SpO2: 99% 100% 96% 97%  Weight:    56.2 kg  Height:        Intake/Output Summary (Last 24 hours) at 05/07/2021 0851 Last data filed at 05/07/2021 0555 Gross per 24 hour  Intake --  Output 1750 ml  Net -1750 ml   Net neg 8.8 L    Last 3 Weights 05/07/2021 05/04/2021 05/03/2021  Weight (lbs) 123 lb 12.8 oz 146 lb 9.7 oz 145 lb 11.6 oz  Weight (kg) 56.155 kg 66.5 kg 66.1 kg      Telemetry    SR  9 beat NSVT  - Personally Reviewed  ECG    No new ECG - Personally Reviewed  Physical Exam   GEN: Sitting in bed, comfortable Neck: JVD is increased  Cardiac: RRR, no murmur   Respiratory: Clear  with good air movement GI: Soft, nontender, non-distended  MS: bilateral AKA, no significant edema  Neuro:  Nonfocal  Psych: Normal affect   Labs    High Sensitivity Troponin:   Recent Labs  Lab 04/19/21 0019 04/20/21 0254 04/20/21 0502 04/20/21 0639  TROPONINIHS 52* 55* 55* 56*     Chemistry Recent Labs  Lab 05/03/21 0100 05/04/21 0204 05/05/21 0234 05/06/21 0154 05/07/21 0233  NA 138 140 139 142 139  K 4.0 3.5 3.5 4.3 3.3*  CL 109 110 112* 111 106  CO2 20* 18* 19* 20* 22  GLUCOSE 159* 174* 97 97 98  BUN 26* 25* 24* 24* 29*  CREATININE 2.21* 2.14* 2.12* 2.46* 2.62*  CALCIUM 7.0* 6.7* 6.9* 7.3* 7.0*  MG  --  1.8 1.8 1.8  --   ALBUMIN 1.8* 1.8* 1.7*  --   --   GFRNONAA 32* 33* 34* 28* 26*  ANIONGAP 9 12 8 11 11     Lipids No results for input(s): CHOL, TRIG, HDL, LABVLDL, LDLCALC, CHOLHDL in the last 168 hours.  Hematology Recent Labs  Lab 05/04/21 0204 05/06/21 0154  WBC 14.6* 13.1*  RBC 3.13* 3.33*  HGB 8.7* 9.4*  HCT 27.6* 28.9*  MCV 88.2 86.8  MCH 27.8 28.2  MCHC 31.5 32.5  RDW 15.3 14.9  PLT 482* 519*   Thyroid No results for input(s): TSH, FREET4 in the last 168 hours.  BNPNo results for input(s): BNP, PROBNP in the last 168 hours.  DDimer No results for input(s): DDIMER in the last 168 hours.   Radiology    DG Chest 2 View  Result Date: 05/06/2021 CLINICAL DATA:  Shortness of breath EXAM: CHEST - 2 VIEW COMPARISON:  Yesterday FINDINGS: Indistinct bilateral airspace disease. Improved aeration from yesterday. Trace pleural fluid. No pneumothorax. Postoperative thoracic inlet. Normal heart size. IMPRESSION: Improving bilateral airspace disease. Electronically Signed   By: Jorje Guild M.D.   On: 05/06/2021 06:22   DG CHEST PORT 1 VIEW  Result Date: 05/05/2021 CLINICAL DATA:  Shortness of breath evaluation. EXAM: PORTABLE CHEST 1 VIEW COMPARISON:  Radiographs 04/28/2021 and 04/26/2021. FINDINGS: 1053 hours. The heart size and mediastinal  contours are stable. There are surgical clips at the thoracic inlet. There are significantly worsening diffuse nodular airspace opacities in both lungs which had improved on the most recent study of 1 week ago. No pneumothorax or significant pleural effusion. The bones appear stable. Telemetry leads overlie the chest. IMPRESSION: Recurrent bilateral airspace opacities suspicious for recurrent multilobar pneumonia. Given fluctuation on recent radiographs, considerations would include pulmonary hemorrhage and atypical inflammation. Electronically Signed   By: Richardean Sale M.D.   On: 05/05/2021 11:06    Cardiac Studies   Echo 04/20/21   1. Left ventricular ejection fraction, by estimation, is 45 to 50%. The  left ventricle has mildly decreased function. There is moderate left  ventricular hypertrophy.   2. Right ventricular systolic function is normal. The right ventricular  size is normal.   3. Left atrial size was severely dilated.   4. Right atrial size was moderately dilated.   5. The inferior vena cava is normal in size with greater than 50%  respiratory variability, suggesting right atrial pressure of 3 mmHg.   6. Limited echo evaluate LV function.   Patient Profile     65 y.o. male with history of presumed CAD, mild cardiomyopathy with normalization of EF on Echo in 12/2020, paroxysmal atrial fibrillation on Eliquis, PAD s/p bilateral AKA in 2018, hypertension, hyperlipidemia, type 2 diabetes mellitus, hypothyroidism, CKD stage III, polysubstance abuse (cocaine, alcohol, tobacco), and poor compliance who was admitted on 04/19/2021 with acute hypoxic respiratory failure secondary to community acquired pneumonia and CHF. Course has been complicated by NSVT with concern for underlying CAD although unable to assess coronaries due to worsening renal function; pAfib; and AKI on CKD.   Assessment & Plan    #Acute on Chronic HF with Mildly Reduced EF: Patient initially presented with worsening  SOB found to be volume overloaded consistent with acute on chronic HF exacerbation as well as with CAP. Initially volume status improved with diuresis and lasix was held, however, decompensated on 05/05/21 with REDs-clip 53 consistent with continued volume overload. Given lasix 40mg  followed by 80mg .  REDS clip is 46 sugg continued volume overload    Continue IV lasix   Follow   #Ventricular tachycardia   Sustained earlier in admit   No recurrence  on amiodarone. Currently on oral   Question cause:  Unable to assess coronaries due to worsening renal function; will need once able. -  Continue amiodarone load 400 bid   Would go to qd at d/c  -Will need ischemic evaluation with cath once renal function improves  #pAFib: -Continue metop 100mg  XL daily -Continue amiodarone as above  REmains in SR   -Continue apixaban 5mg  BID for [facility]  #Elevated Troponin with Presumed CAD: No chest pain currently. LVEF 45-50%. Will need ischemic evaluation once Cr improves. -No ASA due to need for apixaban -Continue crestor 40mg  daily  #HTN: With episodes of hypotension during hospitalization. BP is currently OK    #PAD s/p AKA: -Continue crestor 40mg  daily as above  #AKI on CKD III: Remains volume vverloaded  BUN/Cr 29/2.62    Cr up from yesterday  Will review with renal       #CAP: -Completed ABX     For questions or updates, please contact [facility] Please consult [email or web address] for contact info under        Signed, Dorris Carnes, MD  05/07/2021, 8:51 AM

## 2021-05-07 NOTE — Plan of Care (Signed)
  Problem: Education: Goal: Knowledge of General Education information will improve Description: Including pain rating scale, medication(s)/side effects and non-pharmacologic comfort measures Outcome: Progressing   Problem: Health Behavior/Discharge Planning: Goal: Ability to manage health-related needs will improve Outcome: Progressing   Problem: Clinical Measurements: Goal: Ability to maintain clinical measurements within normal limits will improve Outcome: Progressing Goal: Will remain free from infection Outcome: Progressing Goal: Diagnostic test results will improve Outcome: Progressing Goal: Respiratory complications will improve Outcome: Progressing Goal: Cardiovascular complication will be avoided Outcome: Progressing   Problem: Activity: Goal: Risk for activity intolerance will decrease Outcome: Progressing   Problem: Elimination: Goal: Will not experience complications related to urinary retention Outcome: Progressing   Problem: Pain Managment: Goal: General experience of comfort will improve Outcome: Progressing   Problem: Safety: Goal: Ability to remain free from injury will improve Outcome: Progressing   Problem: Skin Integrity: Goal: Risk for impaired skin integrity will decrease Outcome: Progressing

## 2021-05-08 DIAGNOSIS — I472 Ventricular tachycardia, unspecified: Secondary | ICD-10-CM | POA: Diagnosis not present

## 2021-05-08 DIAGNOSIS — I5021 Acute systolic (congestive) heart failure: Secondary | ICD-10-CM | POA: Diagnosis not present

## 2021-05-08 DIAGNOSIS — R0602 Shortness of breath: Secondary | ICD-10-CM | POA: Diagnosis not present

## 2021-05-08 DIAGNOSIS — I482 Chronic atrial fibrillation, unspecified: Secondary | ICD-10-CM | POA: Diagnosis not present

## 2021-05-08 DIAGNOSIS — I248 Other forms of acute ischemic heart disease: Secondary | ICD-10-CM

## 2021-05-08 LAB — GLUCOSE, CAPILLARY
Glucose-Capillary: 84 mg/dL (ref 70–99)
Glucose-Capillary: 96 mg/dL (ref 70–99)
Glucose-Capillary: 85 mg/dL (ref 70–99)
Glucose-Capillary: 122 mg/dL — ABNORMAL HIGH (ref 70–99)

## 2021-05-08 LAB — BASIC METABOLIC PANEL
Anion gap: 11 (ref 5–15)
BUN: 32 mg/dL — ABNORMAL HIGH (ref 8–23)
CO2: 21 mmol/L — ABNORMAL LOW (ref 22–32)
Calcium: 7 mg/dL — ABNORMAL LOW (ref 8.9–10.3)
Chloride: 107 mmol/L (ref 98–111)
Creatinine, Ser: 2.93 mg/dL — ABNORMAL HIGH (ref 0.61–1.24)
GFR, Estimated: 23 mL/min — ABNORMAL LOW (ref >60)
Glucose, Bld: 153 mg/dL — ABNORMAL HIGH (ref 70–99)
Potassium: 3.7 mmol/L (ref 3.5–5.1)
Sodium: 139 mmol/L (ref 135–145)

## 2021-05-08 LAB — MAGNESIUM: Magnesium: 1.8 mg/dL (ref 1.7–2.4)

## 2021-05-08 MED ORDER — HYDRALAZINE HCL 25 MG PO TABS
25.0000 mg | ORAL_TABLET | Freq: Three times a day (TID) | ORAL | Status: DC
  Administered 2021-05-08 – 2021-05-11 (×9): 25 mg via ORAL
  Filled 2021-05-08 (×9): qty 1

## 2021-05-08 MED ORDER — ASPIRIN 81 MG PO CHEW
81.0000 mg | CHEWABLE_TABLET | Freq: Once | ORAL | Status: AC
  Administered 2021-05-08: 81 mg via ORAL
  Filled 2021-05-08: qty 1

## 2021-05-08 MED ORDER — ISOSORBIDE MONONITRATE ER 30 MG PO TB24
15.0000 mg | ORAL_TABLET | Freq: Every day | ORAL | Status: DC
  Administered 2021-05-08 – 2021-05-11 (×4): 15 mg via ORAL
  Filled 2021-05-08 (×4): qty 1

## 2021-05-08 NOTE — TOC Progression Note (Signed)
Transition of Care [facility]) - Progression Note    Patient Details  Name: Todd [name] MRN: [id number] Date of Birth: 1956-06-01  Transition of Care [facility]) CM/SW [place], LCSW Phone Number:[phone] 05/08/2021, 2:09 PM  Clinical Narrative:     CSW acknowledged a consult that pt's sisters were dissatisfied with pt's LTC facility. CSW spoke with pt's [name] and explained that LTC facilities changes are usually more difficult to arrange from the hospital. CSW encouraged Todd [name] to reach out to pt's SW at the facility. CSW inquired about pt's [employer] and Todd [name] explained that it was still in process. CSW explained that if pt's referral was sent out now he probably would not receive many offers due to his medicaid pending. Dean asked for a follow up on Monday because she was going to check on his Medicaid.  TOC team will continue to assist with discharge planning needs.      Barriers to Discharge: Continued Medical Work up  Expected Discharge Plan and Services           Expected Discharge Date: 04/25/21                                     Social Determinants of Health (SDOH) Interventions    Readmission Risk Interventions No flowsheet data found.

## 2021-05-08 NOTE — Progress Notes (Signed)
PROGRESS NOTE  Todd [name]  DEY:[id number] DOB: 1955/12/03 DOA: 04/19/2021 PCP: [name], FNP  Brief Narrative: Todd [name] is a 65 y.o. male recently admitted from 01/10/2021 to 01/15/2021 due to chronic A. fib with RVR, with medical history significant for essential hypertension, hypothyroidism, type 2 diabetes mellitus, chronic atrial fibrillation, status post bilateral AKA, history of polysubstance abuse (alcohol, tobacco and cocaine) who presents to [facility] ED from [facility] via EMS due to shortness of breath.  He was noted to be hypoxic, in the 80% on room air at the SNF. Work-up revealed acute hypoxic respiratory failure, right-sided community-acquired pneumonia, elevated troponin, acute on chronic diastolic CHF, AKI, non-anion gap metabolic acidosis, and electrolyte abnormalities.Echocardiogram revealed LV systolic dysfunction and monitoring detected RVR.  Course complicated by sustained VT, Cardiology consulted, ischemic work-up precluded by development of AKI for which nephrology consulted, diuretics were held, then he developed worsening dyspnea/hypoxia and IV Lasix has been resumed.    Assessment & Plan: Principal Problem:   CAP (community acquired pneumonia) Active Problems:   Essential hypertension   Acquired hypothyroidism   Elevated troponin   Acute respiratory failure with hypoxia (HCC)   Hyperglycemia due to diabetes mellitus (HCC)   Leukocytosis   Reactive thrombocytosis   S/P AKA (above knee amputation) bilateral (HCC)   AKI (acute kidney injury) ([place])   Atrial fibrillation, chronic (HCC)   Hypomagnesemia   Elevated brain natriuretic peptide (BNP) level   Polysubstance abuse (HCC)   Noncompliance with medication regimen   Chronic diastolic CHF (congestive heart failure) (HCC)   Hypoalbuminemia due to protein-calorie malnutrition (HCC)   GERD (gastroesophageal reflux disease)   SOB (shortness of breath)  Acute hypoxic respiratory failure: Presumed  to be due to decompensated CHF (primary component), right-sided pneumonia, and COPD exacerbation.  -There was concern about hypoxia while sleeping but overnight 11/24 had oxygen saturations in the high 90s on room air. - Not on home oxygen prior to admission.  Hypoxia now resolved and patient has been weaned off to room air on 11/24. - Two-view chest x-ray 11/24 personally reviewed.  Significantly improved bilateral airspace disease.  Recommend follow-up chest x-ray in 4 weeks.  Sustained ventricular tachycardia: Suspected ischemic etiology per cardiology.  - Will require work up (cMRI, LHC) though renal impairment is currently prohibitive.  - Continue amiodarone loading (400 mg twice daily for a full 10 g load) then transition to 400 Mg daily.  Continue metoprolol succinate 100 Mg daily. - Continue cardiac monitoring.  - Keep K >4, Mg >2. - As per cardiology follow-up, suspect will either need LifeVest or ICD evaluation.  Cardiology will have EP reevaluate him once they are able to assess his coronaries.  PAF, multifocal atrial tachycardia: Recent admit for RVR.  - Continue metoprolol succinate 100 Mg daily and amiodarone as above. - Continue eliquis for anticoagulation, dose may need to be adjusted by pharmacy.. - Maintain K >4, Mg >2.   Acute on chronic HFrEF, HTN: LVEF 45-50% without RWMA, +LVH, normal IVC. Severe LAE, moderate RAE. BNP elevated.  - Cardiology consulted, deferring ischemic eval pending improvement in AKI. -Was on IV Lasix 40 mg twice daily, Toprol-XL 100 Mg daily. -10.4 L thus far.  Weight down from 150 pounds on 11/8 to 123.8 pounds on 11/25. - For the last couple days, IV Lasix was dosed based on elevated Reds vest reading even though on clinical exam he did not appear fluid overloaded.  Now creatinine has expectedly increased up to 2.9.  Holding  further diuretics.  Cardiology starting Imdur 15 Mg daily and hydralazine 25 Mg 3 times daily.  Unable to start ACE I/ARB/ARN  I/MRA given AKI.  AKI complicating CKD stage IIIa, with NAGMA: No abnormality on U/S, though did have trace perinephric fluid on right. Bland sediment with definite proteinuria 1.03. FENa not consistent with prerenal azotemia at 1.78%. Note history of worse AKIs previously.  -Initial IV Lasix for decompensated CHF, then held for AKI, then resumed again for volume overload.  Creatinine expectedly rising while on IV Lasix and nephrology following.  Creatinine up to 2.9 today. - Continue bicarbonate tabs, even at discharge as per nephrology.   Demand myocardial ischemia: Mild troponin elevation with reassuring delta no anginal complaints.  - Defer w/u to cardiology-eventual plans to get ischemic evaluation once creatinine improves. - As per cardiology follow-up, working diagnosis is likely underlying CAD.  Starting on aspirin 81 Mg daily Lipitor 40 Mg daily.  Right-sided CAP: Completed azithromycin x5 days. Stopped vanc w/nephrotoxicity and negative MRSA PCR. Completed 7 days of zosyn. Blood Cx's NGTD x5 days, no sputum culture obtained.  Recommend repeating chest x-ray in 4 weeks.  COPD with exacerbation:  - Continue inhaled BDs, avoiding steroids due to severe hyperglycemia. Exacerbation has resolved.  Uncontrolled T2DM with steroid-induced hyperglycemia: HbA1c 7.9%.  -Tightly controlled CBGs and a couple of hypoglycemic episodes 11/24. - DC mealtime NovoLog.  Continue SSI for now.  Treat per hypoglycemia protocol. - No further hypoglycemic episodes thus far.  Polysubstance abuse: Previously +alcohol, tobacco, cocaine.  - Cessation counseling.  - Continue nicotine patch.   Depression:  -As per family, patient reportedly was quite emotional and tearful a few days ago and restarted SSRI.  Improved.  Recommend pursuing outpatient therapy. No indication for inpatient psychiatry consultation at this time.  Hypocalcemia:  - Continue supplementation. Corrected value (albumin 1.8) = 8.8. -  Vitamin D 29.7. Supplement.  Hypothyroidism: Repeat TSH is 0.315, last was 20.52 in September 2022 which was improved from 189 in July in setting of medication nonadherence. Free T4 is 1.12 which is the ULN.  - In setting of AFib/MAT, we did decrease synthroid dose 167mcg > 136mcg. - Recommend repeating full TFTs in 4 weeks.  Anemia of chronic disease: Having a decline in hgb from admission, though all cell lines are down and hgb was above baseline on admission.  - Hemoglobin has stabilized somewhat near baseline ~9g/dl   HTN: Tx as above  PVD s/p bilateral AKA (2018-2019):  - Continue statin, anticoagulation  GERD:  - Continue PPI  Suspected malnutrition with hypoalbuminemia. - Dietitian consulted and as per their note 82/50, no cords applicable..   DVT prophylaxis: Eliquis Code Status: Full Family Communication: None at bedside. Disposition Plan:  Status is: Inpatient  Remains inpatient appropriate because: Progressive AKI awaiting further evaluation and management.  Consultants:  Cardiology Nephrology  Procedures:  Echocardiogram  Antimicrobials: Ceftriaxone, azithromycin > vancomycin, zosyn.  Completed course.  Subjective: No complaints reported.  No dyspnea.  Objective: Vitals:   05/07/21 2022 05/08/21 0017 05/08/21 0500 05/08/21 0609  BP: 120/69 105/65  129/75  Pulse: 63 64  64  Resp: 18 18  20   Temp: 98.2 F (36.8 C) 98.5 F (36.9 C)  98 F (36.7 C)  TempSrc: Oral Oral  Oral  SpO2: 98% 96%  100%  Weight:   60.2 kg   Height:        Intake/Output Summary (Last 24 hours) at 05/08/2021 0916 Last data filed at 05/08/2021 0600 Gross  per 24 hour  Intake 5.15 ml  Output 1450 ml  Net -1444.85 ml    Filed Weights   05/04/21 0500 05/07/21 0550 05/08/21 0500  Weight: 66.5 kg 56.2 kg 60.2 kg   Gen: 65 y.o. male, moderately built and poorly nourished lying comfortably propped up in bed without distress. Pulm: Clear to auscultation.  No increased work of  breathing. CV: S1 and S2 heard, RRR.  No JVD or murmurs. GI: Abdomen soft, non-tender, non-distended, with normoactive bowel sounds.  Ext: Warm, dry, BL AKA.  Right stump with dressing clean and dry. Skin: No new rashes, lesions or ulcers on visualized skin. Neuro: Alert and oriented. No focal neurological deficits. Psych: Judgement and insight appear fair. Mood euthymic & affect congruent. Behavior is appropriate.    Data Reviewed: I have personally reviewed following labs and imaging studies  CBC: Recent Labs  Lab 05/04/21 0204 05/06/21 0154  WBC 14.6* 13.1*  NEUTROABS 10.7*  --   HGB 8.7* 9.4*  HCT 27.6* 28.9*  MCV 88.2 86.8  PLT 482* 518*   Basic Metabolic Panel: Recent Labs  Lab 05/02/21 0206 05/03/21 0100 05/04/21 0204 05/05/21 0234 05/06/21 0154 05/07/21 0233 05/08/21 0246  NA 139 138 140 139 142 139 139  K 3.8 4.0 3.5 3.5 4.3 3.3* 3.7  CL 109 109 110 112* 111 106 107  CO2 19* 20* 18* 19* 20* 22 21*  GLUCOSE 146* 159* 174* 97 97 98 153*  BUN 31* 26* 25* 24* 24* 29* 32*  CREATININE 2.30* 2.21* 2.14* 2.12* 2.46* 2.62* 2.93*  CALCIUM 6.9* 7.0* 6.7* 6.9* 7.3* 7.0* 7.0*  MG  --   --  1.8 1.8 1.8  --  1.8  PHOS 3.4 5.1* 5.1* 5.1*  --   --   --    GFR: Estimated Creatinine Clearance: 21.1 mL/min (A) (by C-G formula based on SCr of 2.93 mg/dL (H)). Liver Function Tests: Recent Labs  Lab 05/02/21 0206 05/03/21 0100 05/04/21 0204 05/05/21 0234  ALBUMIN 1.8* 1.8* 1.8* 1.7*   CBG: Recent Labs  Lab 05/07/21 0806 05/07/21 1125 05/07/21 1614 05/07/21 2126 05/08/21 0735  GLUCAP 106* 129* 124* 128* 84   Urine analysis:    Component Value Date/Time   COLORURINE YELLOW 04/24/2021 0446   APPEARANCEUR CLEAR 04/24/2021 0446   APPEARANCEUR Clear 02/09/2018 1352   LABSPEC 1.018 04/24/2021 0446   PHURINE 5.0 04/24/2021 0446   GLUCOSEU NEGATIVE 04/24/2021 0446   HGBUR NEGATIVE 04/24/2021 0446   BILIRUBINUR NEGATIVE 04/24/2021 0446   BILIRUBINUR Negative  02/09/2018 1352   KETONESUR NEGATIVE 04/24/2021 0446   PROTEINUR 100 (A) 04/24/2021 0446   NITRITE NEGATIVE 04/24/2021 0446   LEUKOCYTESUR NEGATIVE 04/24/2021 0446   Recent Results (from the past 240 hour(s))  SARS CORONAVIRUS 2 (TAT 6-24 HRS) Nasopharyngeal Nasopharyngeal Swab     Status: None   Collection Time: 05/04/21  4:46 PM   Specimen: Nasopharyngeal Swab  Result Value Ref Range Status   SARS Coronavirus 2 NEGATIVE NEGATIVE Final    Comment: (NOTE) SARS-CoV-2 target nucleic acids are NOT DETECTED.  The SARS-CoV-2 RNA is generally detectable in upper and lower respiratory specimens during the acute phase of infection. Negative results do not preclude SARS-CoV-2 infection, do not rule out co-infections with other pathogens, and should not be used as the sole basis for treatment or other patient management decisions. Negative results must be combined with clinical observations, patient history, and epidemiological information. The expected result is Negative.  Fact Sheet for Patients: SugarRoll.be  Fact Sheet for Healthcare Providers: https://www.woods-mathews.com/  This test is not yet approved or cleared by the [place] FDA and  has been authorized for detection and/or diagnosis of SARS-CoV-2 by FDA under an Emergency Use Authorization (EUA). This EUA will remain  in effect (meaning this test can be used) for the duration of the COVID-19 declaration under Se ction 564(b)(1) of the Act, 21 U.S.C. section 360bbb-3(b)(1), unless the authorization is terminated or revoked sooner.  Performed at [facility], [place] [address] [place], [place] [address]        Radiology Studies: No results found.  Scheduled Meds:  amiodarone  400 mg Oral BID   apixaban  5 mg Oral BID   aspirin  81 mg Oral Once   atorvastatin  40 mg Oral Daily   calcium carbonate  1,250 mg Oral QHS   cholecalciferol  1,000 Units Oral Daily   feeding  supplement (GLUCERNA SHAKE)  237 mL Oral TID BM   folic acid  1 mg Oral Daily   hydrALAZINE  25 mg Oral Q8H   insulin aspart  0-5 Units Subcutaneous QHS   insulin aspart  0-9 Units Subcutaneous TID WC   isosorbide mononitrate  15 mg Oral Daily   levothyroxine  100 mcg Oral QAC breakfast   metoprolol succinate  100 mg Oral Daily   multivitamin with minerals  1 tablet Oral Daily   pantoprazole  40 mg Oral BID   sertraline  50 mg Oral QHS   sodium bicarbonate  1,300 mg Oral TID   thiamine  100 mg Oral Daily   Continuous Infusions:  sodium chloride Stopped (04/20/21 0916)     LOS: 18 days   Time spent: 25 minutes.  Vernell Leep, MD,  FACP, [facility], [facility], [facility] (Care Management Physician Certified) [facility] [address]  To contact the attending provider between 7A-7P or the covering provider during after hours 7P-7A, please log into the web site [email or web address] and access using universal [place] password for that web site. If you do not have the password, please call the hospital operator.

## 2021-05-08 NOTE — Progress Notes (Signed)
Cardiology Progress Note  Patient ID: Todd [name] MRN: [id number] DOB: 1956-01-12 Date of Encounter: 05/08/2021  Primary Cardiologist: Minus Breeding, MD  Subjective   Chief Complaint: None.   HPI: Denies CP.  Creatinine continues to climb.  No VT on monitor.  Really without any symptoms this morning.  ROS:  All other ROS reviewed and negative. Pertinent positives noted in the HPI.     Inpatient Medications  Scheduled Meds:  amiodarone  400 mg Oral BID   apixaban  5 mg Oral BID   atorvastatin  40 mg Oral Daily   calcium carbonate  1,250 mg Oral QHS   cholecalciferol  1,000 Units Oral Daily   feeding supplement (GLUCERNA SHAKE)  237 mL Oral TID BM   folic acid  1 mg Oral Daily   insulin aspart  0-5 Units Subcutaneous QHS   insulin aspart  0-9 Units Subcutaneous TID WC   isosorbide dinitrate  20 mg Oral BID   levothyroxine  100 mcg Oral QAC breakfast   metoprolol succinate  100 mg Oral Daily   multivitamin with minerals  1 tablet Oral Daily   pantoprazole  40 mg Oral BID   sertraline  50 mg Oral QHS   sodium bicarbonate  1,300 mg Oral TID   thiamine  100 mg Oral Daily   Continuous Infusions:  sodium chloride Stopped (04/20/21 0916)   PRN Meds: sodium chloride, acetaminophen, ipratropium-albuterol, nicotine   Vital Signs   Vitals:   05/07/21 2022 05/08/21 0017 05/08/21 0500 05/08/21 0609  BP: 120/69 105/65  129/75  Pulse: 63 64  64  Resp: 18 18  20   Temp: 98.2 F (36.8 C) 98.5 F (36.9 C)  98 F (36.7 C)  TempSrc: Oral Oral  Oral  SpO2: 98% 96%  100%  Weight:   60.2 kg   Height:        Intake/Output Summary (Last 24 hours) at 05/08/2021 0752 Last data filed at 05/08/2021 0600 Gross per 24 hour  Intake 5.15 ml  Output 1450 ml  Net -1444.85 ml   Last 3 Weights 05/08/2021 05/07/2021 05/04/2021  Weight (lbs) 132 lb 11.5 oz 123 lb 12.8 oz 146 lb 9.7 oz  Weight (kg) 60.2 kg 56.155 kg 66.5 kg      Telemetry  Overnight telemetry shows sinus rhythm in  the 60s, which I personally reviewed.   Physical Exam   Vitals:   05/07/21 2022 05/08/21 0017 05/08/21 0500 05/08/21 0609  BP: 120/69 105/65  129/75  Pulse: 63 64  64  Resp: 18 18  20   Temp: 98.2 F (36.8 C) 98.5 F (36.9 C)  98 F (36.7 C)  TempSrc: Oral Oral  Oral  SpO2: 98% 96%  100%  Weight:   60.2 kg   Height:        Intake/Output Summary (Last 24 hours) at 05/08/2021 0752 Last data filed at 05/08/2021 0600 Gross per 24 hour  Intake 5.15 ml  Output 1450 ml  Net -1444.85 ml    Last 3 Weights 05/08/2021 05/07/2021 05/04/2021  Weight (lbs) 132 lb 11.5 oz 123 lb 12.8 oz 146 lb 9.7 oz  Weight (kg) 60.2 kg 56.155 kg 66.5 kg    Body mass index is 20.18 kg/m.   General: Well nourished, well developed, in no acute distress Head: Atraumatic, normal size  Eyes: PEERLA, EOMI  Neck: Supple, no JVD Endocrine: No thryomegaly Cardiac: Normal S1, S2; RRR; no murmurs, rubs, or gallops Lungs: Clear to auscultation bilaterally, no wheezing, rhonchi or  rales  Abd: Soft, nontender, no hepatomegaly  Ext: Status post bilateral AKA, no edema Musculoskeletal: Status post bilateral AKA Skin: Warm and dry, no rashes   Neuro: Alert and oriented to person, place, time, and situation, CNII-XII grossly intact, no focal deficits  Psych: Normal mood and affect   Labs  High Sensitivity Troponin:   Recent Labs  Lab 04/19/21 0019 04/20/21 0254 04/20/21 0502 04/20/21 0639  TROPONINIHS 52* 55* 55* 56*     Cardiac EnzymesNo results for input(s): TROPONINI in the last 168 hours. No results for input(s): TROPIPOC in the last 168 hours.  Chemistry Recent Labs  Lab 05/03/21 0100 05/04/21 0204 05/05/21 0234 05/06/21 0154 05/07/21 0233 05/08/21 0246  NA 138 140 139 142 139 139  K 4.0 3.5 3.5 4.3 3.3* 3.7  CL 109 110 112* 111 106 107  CO2 20* 18* 19* 20* 22 21*  GLUCOSE 159* 174* 97 97 98 153*  BUN 26* 25* 24* 24* 29* 32*  CREATININE 2.21* 2.14* 2.12* 2.46* 2.62* 2.93*  CALCIUM 7.0*  6.7* 6.9* 7.3* 7.0* 7.0*  ALBUMIN 1.8* 1.8* 1.7*  --   --   --   GFRNONAA 32* 33* 34* 28* 26* 23*  ANIONGAP 9 12 8 11 11 11     Hematology Recent Labs  Lab 05/04/21 0204 05/06/21 0154  WBC 14.6* 13.1*  RBC 3.13* 3.33*  HGB 8.7* 9.4*  HCT 27.6* 28.9*  MCV 88.2 86.8  MCH 27.8 28.2  MCHC 31.5 32.5  RDW 15.3 14.9  PLT 482* 519*   BNPNo results for input(s): BNP, PROBNP in the last 168 hours.  DDimer No results for input(s): DDIMER in the last 168 hours.   Radiology  No results found.  Cardiac Studies  TTE 04/20/2021   1. Left ventricular ejection fraction, by estimation, is 45 to 50%. The  left ventricle has mildly decreased function. There is moderate left  ventricular hypertrophy.   2. Right ventricular systolic function is normal. The right ventricular  size is normal.   3. Left atrial size was severely dilated.   4. Right atrial size was moderately dilated.   5. The inferior vena cava is normal in size with greater than 50%  respiratory variability, suggesting right atrial pressure of 3 mmHg.   6. Limited echo evaluate LV function.   Patient Profile  Todd [name] is a 65 y.o. male with paroxysmal atrial fibrillation, congestive heart failure, PAD status post bilateral AKA, hypertension, lipidemia, diabetes, polysubstance abuse who was admitted on 04/19/2021 with acute hypoxic respiratory failure secondary to pneumonia and congestive heart failure.  Course has been complicated by sustained ventricular tachycardia and AKI.  Assessment & Plan   #Acute systolic heart failure, EF 45 to 50% -Admitted with shortness of breath and volume overload.  Found to have pneumonia as well. -EF 45-50%. -He appears clinically euvolemic to me. -His course is rather difficult is been complicated by AKI as well as sustained ventricular tachycardia.  His AKI precludes invasive coronary angiography. -It appears we are waiting for this to recover before we pursue invasive  evaluation. -Regardless he is not a great candidate for aggressive cardiovascular care.  He is status post AKA and he is cocaine positive as of July 2022. -For now would continue with medical therapy. -Hold further diuresis. -continue metoprolol succinate 100 mg -We will start Imdur 15 mg daily as well hydralazine 25 mg 3 times daily.  No ACE/ARB/Arni/MRA given acute kidney injury.  #Sustained VT -Documented this admission. -Continue amiodarone 400  mg twice daily for a full 10 g load.  Then transition to 400 mg daily. -We are waiting for his kidneys to recover for an ischemia evaluation. -Continue metoprolol succinate 100 mg daily. -I suspect he will either need LifeVest or ICD evaluation.  We will have EP reevaluate him once we can evaluate his coronaries.  If his kidney disease continues to progress we may need to discuss hemodialysis initiation.  Again this is being followed by nephrology.  #Elevated troponin, demand ischemia -Admitted with systolic heart failure and ventricular tachycardia.  Working diagnosis is likely underlying CAD.  No chest pain. -We will start him on aspirin 81 mg daily.  He is on Lipitor 40 mg daily. -Again the working diagnosis is CAD until proven otherwise.  #Paroxysmal atrial fibrillation -Maintaining sinus rhythm.  On amiodarone and beta-blocker. -On Eliquis 5 mg twice daily. -We will need to transition to heparin drip once we see kidneys are recovering.  Until he is closer left heart catheterization, okay to continue Eliquis.  For questions or updates, please contact [place] Please consult [email or web address] for contact info under   Time Spent with Patient: I have spent a total of 35 minutes with patient reviewing hospital notes, telemetry, EKGs, labs and examining the patient as well as establishing an assessment and plan that was discussed with the patient.  > 50% of time was spent in direct patient care.    Signed, Addison Naegeli. Audie Box, MD, [place]  05/08/2021 7:52 AM

## 2021-05-08 NOTE — Progress Notes (Signed)
Patient ID: Todd [name], male   DOB: [birth year], 65 y.o.   MRN: [id number] [facility] Progress Note   Assessment/ Plan:   1. Acute kidney Injury on chronic kidney disease stage IIIa: Urine output not charted from overnight with continued but slow improvement of BUN/creatinine, etiology suspected to be hemodynamic with intravenous diuresis/arrhythmias and relative hypotension. Higher BUN/creatinine noted on labs this AM after transient need to treat with diuresis for worsening dyspnea/hypoxia from pulmonary edema. Clinically better over the past 48 hours and I will hold diuresis today with plans to start low dose torsemide 20 mg daily tomorrow if creatinine better/improved tomorrow.  2.  Acute hypoxic respiratory failure: Secondary to pneumonia and congestive heart failure-on diuretics transiently held after developing worsening renal function and he is status post azithromycin and Zosyn. 3.  Anion gap metabolic acidosis: Secondary to acute kidney injury, currently on sodium bicarbonate supplementation and I will continue to follow his labs to decide on titration versus discontinuation. 4.  Anemia: Likely secondary to acute/critical illness, hemoglobin/hematocrit improving on labs this morning. 5.  History of polysubstance abuse 6.  Hypokalemia: Secondary to losses from ongoing diuresis and will be replaced via oral route  Subjective:   Reports to be feeling better this morning with shortness of breath now resolved   Objective:   BP 129/75 (BP Location: Right Arm)   Pulse 64   Temp 98 F (36.7 C) (Oral)   Resp 20   Ht 5\' 8"  (1.727 m)   Wt 60.2 kg   SpO2 100%   BMI 20.18 kg/m   Intake/Output Summary (Last 24 hours) at 05/08/2021 0950 Last data filed at 05/08/2021 0600 Gross per 24 hour  Intake 5.15 ml  Output 1450 ml  Net -1444.85 ml    Weight change: 4.045 kg  Physical Exam: Gen: Comfortably sitting up in bed without any supplemental oxygen CVS: Pulse regular  rhythm, normal rate, S1 and S2 normal Resp: Clear breath sounds bilaterally without rales/rhonchi Abd: Soft, flat, nontender Ext: Status post bilateral AKA with protective dressing over right stump.  No edema  Imaging: No results found.  Labs: BMET Recent Labs  Lab 05/02/21 0206 05/03/21 0100 05/04/21 0204 05/05/21 0234 05/06/21 0154 05/07/21 0233 05/08/21 0246  NA 139 138 140 139 142 139 139  K 3.8 4.0 3.5 3.5 4.3 3.3* 3.7  CL 109 109 110 112* 111 106 107  CO2 19* 20* 18* 19* 20* 22 21*  GLUCOSE 146* 159* 174* 97 97 98 153*  BUN 31* 26* 25* 24* 24* 29* 32*  CREATININE 2.30* 2.21* 2.14* 2.12* 2.46* 2.62* 2.93*  CALCIUM 6.9* 7.0* 6.7* 6.9* 7.3* 7.0* 7.0*  PHOS 3.4 5.1* 5.1* 5.1*  --   --   --    CBC Recent Labs  Lab 05/04/21 0204 05/06/21 0154  WBC 14.6* 13.1*  NEUTROABS 10.7*  --   HGB 8.7* 9.4*  HCT 27.6* 28.9*  MCV 88.2 86.8  PLT 482* 519*    Medications:     amiodarone  400 mg Oral BID   apixaban  5 mg Oral BID   aspirin  81 mg Oral Once   atorvastatin  40 mg Oral Daily   calcium carbonate  1,250 mg Oral QHS   cholecalciferol  1,000 Units Oral Daily   feeding supplement (GLUCERNA SHAKE)  237 mL Oral TID BM   folic acid  1 mg Oral Daily   hydrALAZINE  25 mg Oral Q8H   insulin aspart  0-5 Units Subcutaneous QHS  insulin aspart  0-9 Units Subcutaneous TID WC   isosorbide mononitrate  15 mg Oral Daily   levothyroxine  100 mcg Oral QAC breakfast   metoprolol succinate  100 mg Oral Daily   multivitamin with minerals  1 tablet Oral Daily   pantoprazole  40 mg Oral BID   sertraline  50 mg Oral QHS   sodium bicarbonate  1,300 mg Oral TID   thiamine  100 mg Oral Daily   Elmarie Shiley, MD 05/08/2021, 9:50 AM

## 2021-05-09 DIAGNOSIS — I5021 Acute systolic (congestive) heart failure: Secondary | ICD-10-CM | POA: Diagnosis not present

## 2021-05-09 DIAGNOSIS — I472 Ventricular tachycardia, unspecified: Secondary | ICD-10-CM | POA: Diagnosis not present

## 2021-05-09 DIAGNOSIS — I482 Chronic atrial fibrillation, unspecified: Secondary | ICD-10-CM | POA: Diagnosis not present

## 2021-05-09 LAB — GLUCOSE, CAPILLARY
Glucose-Capillary: 101 mg/dL — ABNORMAL HIGH (ref 70–99)
Glucose-Capillary: 111 mg/dL — ABNORMAL HIGH (ref 70–99)
Glucose-Capillary: 117 mg/dL — ABNORMAL HIGH (ref 70–99)
Glucose-Capillary: 132 mg/dL — ABNORMAL HIGH (ref 70–99)
Glucose-Capillary: 107 mg/dL — ABNORMAL HIGH (ref 70–99)

## 2021-05-09 LAB — BASIC METABOLIC PANEL
Anion gap: 13 (ref 5–15)
BUN: 32 mg/dL — ABNORMAL HIGH (ref 8–23)
CO2: 22 mmol/L (ref 22–32)
Calcium: 6.8 mg/dL — ABNORMAL LOW (ref 8.9–10.3)
Chloride: 106 mmol/L (ref 98–111)
Creatinine, Ser: 3.12 mg/dL — ABNORMAL HIGH (ref 0.61–1.24)
GFR, Estimated: 21 mL/min — ABNORMAL LOW (ref >60)
Glucose, Bld: 91 mg/dL (ref 70–99)
Potassium: 3.6 mmol/L (ref 3.5–5.1)
Sodium: 141 mmol/L (ref 135–145)

## 2021-05-09 MED ORDER — ALBUMIN HUMAN 25 % IV SOLN
25.0000 g | Freq: Once | INTRAVENOUS | Status: AC
  Administered 2021-05-09: 10:00:00 25 g via INTRAVENOUS
  Filled 2021-05-09: qty 100

## 2021-05-09 NOTE — Progress Notes (Signed)
Cardiology Progress Note  Patient ID: Gloria Lambertson MRN: [id number] DOB: [birth year] Date of Encounter: 05/09/2021  Primary Cardiologist: Minus Breeding, MD  Subjective   Chief Complaint: None.  HPI: No complaints.  Creatinine continues to rise.  He appears euvolemic.  No chest pain or trouble breathing.  ROS:  All other ROS reviewed and negative. Pertinent positives noted in the HPI.     Inpatient Medications  Scheduled Meds:  amiodarone  400 mg Oral BID   apixaban  5 mg Oral BID   atorvastatin  40 mg Oral Daily   calcium carbonate  1,250 mg Oral QHS   cholecalciferol  1,000 Units Oral Daily   feeding supplement (GLUCERNA SHAKE)  237 mL Oral TID BM   folic acid  1 mg Oral Daily   hydrALAZINE  25 mg Oral Q8H   insulin aspart  0-5 Units Subcutaneous QHS   insulin aspart  0-9 Units Subcutaneous TID WC   isosorbide mononitrate  15 mg Oral Daily   levothyroxine  100 mcg Oral QAC breakfast   metoprolol succinate  100 mg Oral Daily   multivitamin with minerals  1 tablet Oral Daily   pantoprazole  40 mg Oral BID   sertraline  50 mg Oral QHS   sodium bicarbonate  1,300 mg Oral TID   thiamine  100 mg Oral Daily   Continuous Infusions:  sodium chloride Stopped (04/20/21 0916)   PRN Meds: sodium chloride, acetaminophen, ipratropium-albuterol, nicotine   Vital Signs   Vitals:   05/08/21 1453 05/08/21 2036 05/09/21 0153 05/09/21 0632  BP: 112/67 118/60 115/65 121/66  Pulse: 63 65 65 60  Resp: 17 18 17 18   Temp:  98.3 F (36.8 C) 98.2 F (36.8 C) 98.1 F (36.7 C)  TempSrc:  Oral Oral Oral  SpO2: 100% 98% 99% 100%  Weight:      Height:        Intake/Output Summary (Last 24 hours) at 05/09/2021 0756 Last data filed at 05/08/2021 2000 Gross per 24 hour  Intake 240 ml  Output 300 ml  Net -60 ml   Last 3 Weights 05/08/2021 05/07/2021 05/04/2021  Weight (lbs) 132 lb 11.5 oz 123 lb 12.8 oz 146 lb 9.7 oz  Weight (kg) 60.2 kg 56.155 kg 66.5 kg      Telemetry   Overnight telemetry shows sinus bradycardia heart rate in the 50s, which I personally reviewed.    Physical Exam   Vitals:   05/08/21 1453 05/08/21 2036 05/09/21 0153 05/09/21 0632  BP: 112/67 118/60 115/65 121/66  Pulse: 63 65 65 60  Resp: 17 18 17 18   Temp:  98.3 F (36.8 C) 98.2 F (36.8 C) 98.1 F (36.7 C)  TempSrc:  Oral Oral Oral  SpO2: 100% 98% 99% 100%  Weight:      Height:        Intake/Output Summary (Last 24 hours) at 05/09/2021 0756 Last data filed at 05/08/2021 2000 Gross per 24 hour  Intake 240 ml  Output 300 ml  Net -60 ml    Last 3 Weights 05/08/2021 05/07/2021 05/04/2021  Weight (lbs) 132 lb 11.5 oz 123 lb 12.8 oz 146 lb 9.7 oz  Weight (kg) 60.2 kg 56.155 kg 66.5 kg    Body mass index is 20.18 kg/m.   General: Well nourished, well developed, in no acute distress Head: Atraumatic, normal size  Eyes: PEERLA, EOMI  Neck: Supple, no JVD Endocrine: No thryomegaly Cardiac: Normal S1, S2; RRR; no murmurs, rubs, or gallops Lungs: Clear  to auscultation bilaterally, no wheezing, rhonchi or rales  Abd: Soft, nontender, no hepatomegaly  Ext: Bilateral leg amputations Skin: Warm and dry, no rashes   Neuro: Alert and oriented to person, place, time, and situation, CNII-XII grossly intact, no focal deficits  Psych: Normal mood and affect   Labs  High Sensitivity Troponin:   Recent Labs  Lab 04/19/21 0019 04/20/21 0254 04/20/21 0502 04/20/21 0639  TROPONINIHS 52* 55* 55* 56*     Cardiac EnzymesNo results for input(s): TROPONINI in the last 168 hours. No results for input(s): TROPIPOC in the last 168 hours.  Chemistry Recent Labs  Lab 05/03/21 0100 05/04/21 0204 05/05/21 0234 05/06/21 0154 05/07/21 0233 05/08/21 0246 05/09/21 0200  NA 138 140 139   < > 139 139 141  K 4.0 3.5 3.5   < > 3.3* 3.7 3.6  CL 109 110 112*   < > 106 107 106  CO2 20* 18* 19*   < > 22 21* 22  GLUCOSE 159* 174* 97   < > 98 153* 91  BUN 26* 25* 24*   < > 29* 32* 32*   CREATININE 2.21* 2.14* 2.12*   < > 2.62* 2.93* 3.12*  CALCIUM 7.0* 6.7* 6.9*   < > 7.0* 7.0* 6.8*  ALBUMIN 1.8* 1.8* 1.7*  --   --   --   --   GFRNONAA 32* 33* 34*   < > 26* 23* 21*  ANIONGAP 9 12 8    < > 11 11 13    < > = values in this interval not displayed.    Hematology Recent Labs  Lab 05/04/21 0204 05/06/21 0154  WBC 14.6* 13.1*  RBC 3.13* 3.33*  HGB 8.7* 9.4*  HCT 27.6* 28.9*  MCV 88.2 86.8  MCH 27.8 28.2  MCHC 31.5 32.5  RDW 15.3 14.9  PLT 482* 519*   BNPNo results for input(s): BNP, PROBNP in the last 168 hours.  DDimer No results for input(s): DDIMER in the last 168 hours.   Radiology  No results found.  Cardiac Studies  TTE 04/20/2021  1. Left ventricular ejection fraction, by estimation, is 45 to 50%. The  left ventricle has mildly decreased function. There is moderate left  ventricular hypertrophy.   2. Right ventricular systolic function is normal. The right ventricular  size is normal.   3. Left atrial size was severely dilated.   4. Right atrial size was moderately dilated.   5. The inferior vena cava is normal in size with greater than 50%  respiratory variability, suggesting right atrial pressure of 3 mmHg.   6. Limited echo evaluate LV function.   Patient Profile  Kashton Mcartor is a 64 y.o. male with paroxysmal atrial fibrillation, congestive heart failure, PAD status post bilateral AKA, hypertension, lipidemia, diabetes, polysubstance abuse who was admitted on 04/19/2021 with acute hypoxic respiratory failure secondary to pneumonia and congestive heart failure.  Course has been complicated by sustained ventricular tachycardia and AKI.  Assessment & Plan   #Acute systolic heart failure, EF 45-50% -Initially admitted with volume overload and pneumonia.  EF is reduced around 45%. -Course has been complicated by AKI as well as sustained ventricular tachycardia. -AKI precludes invasive angiography. -Creatinine remains elevated.  Not a candidate for  invasive angiography currently. -Not a great candidate for aggressive cardiovascular care as he is status post bilateral leg amputations as well as cocaine positive in July 2022. -For now we will continue with medical therapy.  He is on metoprolol succinate 100 mg  daily, Imdur 15 mg daily, hydralazine 25 mg 3 times daily.  No ACE/ARB/Arni/MRA given acute kidney injury. -No need for diuresis.  Defer to nephrology.  He appears euvolemic to me.  #Sustained ventricular tachycardia -Documented this admission.  Evaluated by electrophysiology. -Invasive angiography precluded due to kidney function. -For now we will continue amiodarone 400 mg twice daily.  We do this for a full 10 g load and then transition to 400 mg daily. -Not a great candidate for ICD given substance abuse issues.  We will continue to follow along for now.  #Elevated troponin, demand ischemia -Admitted with systolic heart failure and ventricular tachycardia. -Working diagnosis is underlying CAD. -He is on aspirin 81 mg daily.  He is on Lipitor 40 mg daily. -Awaiting invasive angiography pending renal recovery.  #Paroxysmal atrial fibrillation -Maintaining sinus rhythm.  On amiodarone for VT.  On Eliquis 5 mg twice daily. -Once kidney started to improve if they do we would be transition to heparin for left heart catheterization.  #AKI -Unclear etiology of acute kidney injury.  May end up needing renal biopsy if this is deemed appropriate by nephrology.  Can be transitioned to heparin if this is pursued.  For questions or updates, please contact [place] Please consult [email or web address] for contact info under   Time Spent with Patient: I have spent a total of 25 minutes with patient reviewing hospital notes, telemetry, EKGs, labs and examining the patient as well as establishing an assessment and plan that was discussed with the patient.  > 50% of time was spent in direct patient care.    Signed, Addison Naegeli. Audie Box, MD,  [place]  05/09/2021 7:56 AM

## 2021-05-09 NOTE — Progress Notes (Signed)
PROGRESS NOTE  Todd [name]  VFI:[id number] DOB: 08/14/1955 DOA: 04/19/2021 PCP: [name], FNP  Brief Narrative: Todd [name] is a 65 y.o. male recently admitted from 01/10/2021 to 01/15/2021 due to chronic A. fib with RVR, with medical history significant for essential hypertension, hypothyroidism, type 2 diabetes mellitus, chronic atrial fibrillation, status post bilateral AKA, history of polysubstance abuse (alcohol, tobacco and cocaine) who presents to [facility] ED from [facility] via EMS due to shortness of breath.  He was noted to be hypoxic, in the 80% on room air at the SNF. Work-up revealed acute hypoxic respiratory failure, right-sided community-acquired pneumonia, elevated troponin, acute on chronic diastolic CHF, AKI, non-anion gap metabolic acidosis, and electrolyte abnormalities.Echocardiogram revealed LV systolic dysfunction and monitoring detected RVR.  Initially treated with aggressive IV diuresis for decompensated CHF. Course complicated by sustained VT, Cardiology consulted, ischemic work-up precluded by development of AKI due to hemodynamics from IV diuresis/arrhythmias and relative hypotension. Nephrology consulted, diuretics were held with improvement in AKI, then he developed worsening dyspnea/hypoxia and IV Lasix again resumed with recurrence of worsening AKI.  Nephrology/cardiology continue to assist with management.  Assessment & Plan: Principal Problem:   CAP (community acquired pneumonia) Active Problems:   Essential hypertension   Acquired hypothyroidism   Elevated troponin   Acute respiratory failure with hypoxia (HCC)   Hyperglycemia due to diabetes mellitus (HCC)   Leukocytosis   Reactive thrombocytosis   S/P AKA (above knee amputation) bilateral (HCC)   AKI (acute kidney injury) ([place])   Atrial fibrillation, chronic (HCC)   Hypomagnesemia   Elevated brain natriuretic peptide (BNP) level   Polysubstance abuse (HCC)   Noncompliance with medication  regimen   Chronic diastolic CHF (congestive heart failure) (HCC)   Hypoalbuminemia due to protein-calorie malnutrition (HCC)   GERD (gastroesophageal reflux disease)   SOB (shortness of breath)  Acute hypoxic respiratory failure: Presumed to be due to decompensated CHF (primary component), right-sided pneumonia, and COPD exacerbation.  -There was concern about hypoxia while sleeping but overnight 11/24 had oxygen saturations in the high 90s on room air. - Not on home oxygen prior to admission.  Hypoxia now resolved and patient has been weaned off to room air on 11/24. - Two-view chest x-ray 11/24 personally reviewed.  Significantly improved bilateral airspace disease.  Recommend follow-up chest x-ray in 4 weeks.  Sustained ventricular tachycardia: Suspected ischemic etiology per cardiology.  - Will require work up (cMRI, LHC) though renal impairment is currently prohibitive.  - Continue amiodarone loading (400 mg twice daily for a full 10 g load) then transition to 400 Mg daily.  Continue metoprolol succinate 100 Mg daily. - Continue cardiac monitoring.  - Keep K >4, Mg >2. - As per cardiology follow-up, suspect will either need LifeVest or ICD evaluation.  Cardiology will have EP reevaluate him once they are able to assess his coronaries.  PAF, multifocal atrial tachycardia: Recent admit for RVR.  - Continue metoprolol succinate 100 Mg daily and amiodarone as above. - Continue eliquis for anticoagulation, dose may need to be adjusted by pharmacy.. - Maintain K >4, Mg >2.   Acute on chronic HFrEF, HTN: LVEF 45-50% without RWMA, +LVH, normal IVC. Severe LAE, moderate RAE. BNP elevated.  - Cardiology consulted, deferring ischemic eval pending improvement in AKI. [facility] course has gone back and forth between aggressive IV diuresis, AKI, holding diuretics, improving creatinine, worsening CHF symptoms, resuming IV diuresis, return of AKI.  Currently off of diuretics due to progressively rising  creatinine and clinically  euvolemic. -Continue Toprol-XL 100 Mg daily, Imdur 15 Mg daily and hydralazine 25 Mg 3 times daily.  Unable to start ACE I/ARB/ARN I/MRA given AKI.  AKI complicating CKD stage IIIa, with NAGMA: No abnormality on U/S, though did have trace perinephric fluid on right. Bland sediment with definite proteinuria 1.03. FENa not consistent with prerenal azotemia at 1.78%. Note history of worse AKIs previously.  -Initial IV Lasix for decompensated CHF, then held for AKI, then resumed again for volume overload.  Creatinine again progressively worsened, last dose of IV Lasix was on 11/26.  Creatinine up to 3.12. -Nephrology ordering IV albumin and indicate high risk of progression and needing dialysis in the future. - Continue bicarbonate tabs, even at discharge as per nephrology.   Demand myocardial ischemia: Mild troponin elevation with reassuring delta no anginal complaints.  - Defer w/u to cardiology-eventual plans to get ischemic evaluation once creatinine improves. - As per cardiology follow-up, working diagnosis is likely underlying CAD.  Starting on aspirin 81 Mg daily Lipitor 40 Mg daily.  Right-sided CAP: Completed azithromycin x5 days. Stopped vanc w/nephrotoxicity and negative MRSA PCR. Completed 7 days of zosyn. Blood Cx's NGTD x5 days, no sputum culture obtained.  Recommend repeating chest x-ray in 4 weeks.  COPD with exacerbation:  - Continue inhaled BDs, avoiding steroids due to severe hyperglycemia. Exacerbation has resolved.  Uncontrolled T2DM with steroid-induced hyperglycemia: HbA1c 7.9%.  -Tightly controlled CBGs and a couple of hypoglycemic episodes 11/24. - DC mealtime NovoLog.  Continue SSI for now.  Treat per hypoglycemia protocol. - No further hypoglycemic episodes thus far.  Polysubstance abuse: Previously +alcohol, tobacco, cocaine.  - Cessation counseling.  - Continue nicotine patch.   Depression:  -As per family, patient reportedly was quite  emotional and tearful a few days ago and restarted SSRI.  Improved.  Recommend pursuing outpatient therapy. No indication for inpatient psychiatry consultation at this time.  Hypocalcemia:  - Continue supplementation. Corrected value (albumin 1.8) = 8.2. - Vitamin D 29.7. Supplement.  Hypothyroidism: Repeat TSH is 0.315, last was 20.52 in September 2022 which was improved from 189 in July in setting of medication nonadherence. Free T4 is 1.12 which is the ULN.  - In setting of AFib/MAT, we did decrease synthroid dose 143mcg > 1104mcg. - Recommend repeating full TFTs in 4 weeks.  Anemia of chronic disease: Having a decline in hgb from admission, though all cell lines are down and hgb was above baseline on admission.  - Hemoglobin has stabilized somewhat near baseline ~9g/dl   HTN: Tx as above  PVD s/p bilateral AKA (2018-2019):  - Continue statin, anticoagulation  GERD:  - Continue PPI  Suspected malnutrition with hypoalbuminemia. - Dietitian consulted and as per their note 72/09, no cords applicable..   DVT prophylaxis: Eliquis Code Status: Full Family Communication: None at bedside. Disposition Plan:  Status is: Inpatient  Remains inpatient appropriate because: Progressive AKI awaiting further evaluation and management.  Consultants:  Cardiology Nephrology  Procedures:  Echocardiogram  Antimicrobials: Ceftriaxone, azithromycin > vancomycin, zosyn.  Completed course.  Subjective: Patient denied any complaints.  Specifically no dyspnea.  Subsequently RN reported that patient had 2-3 episodes of diarrhea daily after eating graham crackers.  Advised to avoid graham crackers  Objective: Vitals:   05/09/21 0153 05/09/21 0632 05/09/21 1006 05/09/21 1442  BP: 115/65 121/66 120/65 115/63  Pulse: 65 60 64 62  Resp: 17 18 17 18   Temp: 98.2 F (36.8 C) 98.1 F (36.7 C) 97.7 F (36.5 C)   TempSrc:  Oral Oral Axillary   SpO2: 99% 100% 100% 100%  Weight:      Height:         Intake/Output Summary (Last 24 hours) at 05/09/2021 1502 Last data filed at 05/08/2021 2000 Gross per 24 hour  Intake 240 ml  Output 300 ml  Net -60 ml    Filed Weights   05/04/21 0500 05/07/21 0550 05/08/21 0500  Weight: 66.5 kg 56.2 kg 60.2 kg   Gen: 64 y.o. male, moderately built and poorly nourished lying comfortably propped up in bed without distress.  Oral mucosa moist. Pulm: Clear to auscultation.  No increased work of breathing. CV: S1 and S2 heard, RRR.  No JVD or murmurs.  Telemetry personally reviewed: SB in the 50s-SR in the 60s.  No NSVT noted. GI: Abdomen soft, non-tender, non-distended, with normoactive bowel sounds.  Ext: Warm, dry, BL AKA.  Right stump with dressing clean and dry. Skin: No new rashes, lesions or ulcers on visualized skin. Neuro: Alert and oriented. No focal neurological deficits. Psych: Judgement and insight appear fair. Mood euthymic & affect congruent. Behavior is appropriate.    Data Reviewed: I have personally reviewed following labs and imaging studies  CBC: Recent Labs  Lab 05/04/21 0204 05/06/21 0154  WBC 14.6* 13.1*  NEUTROABS 10.7*  --   HGB 8.7* 9.4*  HCT 27.6* 28.9*  MCV 88.2 86.8  PLT 482* 119*   Basic Metabolic Panel: Recent Labs  Lab 05/03/21 0100 05/04/21 0204 05/05/21 0234 05/06/21 0154 05/07/21 0233 05/08/21 0246 05/09/21 0200  NA 138 140 139 142 139 139 141  K 4.0 3.5 3.5 4.3 3.3* 3.7 3.6  CL 109 110 112* 111 106 107 106  CO2 20* 18* 19* 20* 22 21* 22  GLUCOSE 159* 174* 97 97 98 153* 91  BUN 26* 25* 24* 24* 29* 32* 32*  CREATININE 2.21* 2.14* 2.12* 2.46* 2.62* 2.93* 3.12*  CALCIUM 7.0* 6.7* 6.9* 7.3* 7.0* 7.0* 6.8*  MG  --  1.8 1.8 1.8  --  1.8  --   PHOS 5.1* 5.1* 5.1*  --   --   --   --    GFR: Estimated Creatinine Clearance: 19.8 mL/min (A) (by C-G formula based on SCr of 3.12 mg/dL (H)). Liver Function Tests: Recent Labs  Lab 05/03/21 0100 05/04/21 0204 05/05/21 0234  ALBUMIN 1.8* 1.8* 1.7*    CBG: Recent Labs  Lab 05/08/21 1639 05/08/21 2209 05/09/21 0635 05/09/21 0800 05/09/21 1136  GLUCAP 85 122* 101* 111* 117*   Urine analysis:    Component Value Date/Time   COLORURINE YELLOW 04/24/2021 [place] 04/24/2021 0446   APPEARANCEUR Clear 02/09/2018 1352   LABSPEC 1.018 04/24/2021 0446   PHURINE 5.0 04/24/2021 0446   GLUCOSEU NEGATIVE 04/24/2021 0446   HGBUR NEGATIVE 04/24/2021 0446   BILIRUBINUR NEGATIVE 04/24/2021 0446   BILIRUBINUR Negative 02/09/2018 1352   KETONESUR NEGATIVE 04/24/2021 0446   PROTEINUR 100 (A) 04/24/2021 0446   NITRITE NEGATIVE 04/24/2021 0446   LEUKOCYTESUR NEGATIVE 04/24/2021 0446   Recent Results (from the past 240 hour(s))  SARS CORONAVIRUS 2 (TAT 6-24 HRS) Nasopharyngeal Nasopharyngeal Swab     Status: None   Collection Time: 05/04/21  4:46 PM   Specimen: Nasopharyngeal Swab  Result Value Ref Range Status   SARS Coronavirus 2 NEGATIVE NEGATIVE Final    Comment: (NOTE) SARS-CoV-2 target nucleic acids are NOT DETECTED.  The SARS-CoV-2 RNA is generally detectable in upper and lower respiratory specimens during the acute phase  of infection. Negative results do not preclude SARS-CoV-2 infection, do not rule out co-infections with other pathogens, and should not be used as the sole basis for treatment or other patient management decisions. Negative results must be combined with clinical observations, patient history, and epidemiological information. The expected result is Negative.  Fact Sheet for Patients: SugarRoll.be  Fact Sheet for Healthcare Providers: https://www.woods-mathews.com/  This test is not yet approved or cleared by the [place] FDA and  has been authorized for detection and/or diagnosis of SARS-CoV-2 by FDA under an Emergency Use Authorization (EUA). This EUA will remain  in effect (meaning this test can be used) for the duration of the COVID-19  declaration under Se ction 564(b)(1) of the Act, 21 U.S.C. section 360bbb-3(b)(1), unless the authorization is terminated or revoked sooner.  Performed at [facility], [place] [address] [place], [place] [address]        Radiology Studies: No results found.  Scheduled Meds:  amiodarone  400 mg Oral BID   apixaban  5 mg Oral BID   atorvastatin  40 mg Oral Daily   calcium carbonate  1,250 mg Oral QHS   cholecalciferol  1,000 Units Oral Daily   feeding supplement (GLUCERNA SHAKE)  237 mL Oral TID BM   folic acid  1 mg Oral Daily   hydrALAZINE  25 mg Oral Q8H   insulin aspart  0-5 Units Subcutaneous QHS   insulin aspart  0-9 Units Subcutaneous TID WC   isosorbide mononitrate  15 mg Oral Daily   levothyroxine  100 mcg Oral QAC breakfast   metoprolol succinate  100 mg Oral Daily   multivitamin with minerals  1 tablet Oral Daily   pantoprazole  40 mg Oral BID   sertraline  50 mg Oral QHS   sodium bicarbonate  1,300 mg Oral TID   thiamine  100 mg Oral Daily   Continuous Infusions:  sodium chloride Stopped (04/20/21 0916)     LOS: 19 days   Time spent: 25 minutes.  Vernell Leep, MD,  FACP, [facility], [facility], [facility] (Care Management Physician Certified) [facility] [place]  To contact the attending provider between 7A-7P or the covering provider during after hours 7P-7A, please log into the web site [email or web address] and access using universal [place] password for that web site. If you do not have the password, please call the hospital operator.

## 2021-05-09 NOTE — Progress Notes (Signed)
Patient ID: Todd [name], male   DOB: [birth year], 65 y.o.   MRN: [id number] [facility] Progress Note   Assessment/ Plan:   1. Acute kidney Injury on chronic kidney disease stage IIIa: Urine output not charted from overnight with continued but slow improvement of BUN/creatinine, etiology suspected to be hemodynamic with intravenous diuresis/arrhythmias and relative hypotension. BUN/creatinine slightly higher after just over 24hrs off diuretics; he is euvolemic on exam and without respiratory complaints. I have ordered 25 gm of 25% albumin. He is at high risk of progression and needing dialysis in the near future.   2.  Acute hypoxic respiratory failure: Secondary to pneumonia and congestive heart failure. He has had improvement after diuresis more than from antibiotics. Diuresis currently on hold with rising creatinine and stable respiratory status. 3.  Anion gap metabolic acidosis: Secondary to acute kidney injury, currently on sodium bicarbonate supplementation and I will continue to follow his labs to decide on titration versus discontinuation. 4.  Anemia: Likely secondary to acute/critical illness, hemoglobin/hematocrit improving on labs this morning. 5.  History of polysubstance abuse 6.  Hypokalemia: Secondary to losses from diuresis and has been replaced via oral route  Subjective:   Reports to be feeling better this morning with shortness of breath now resolved   Objective:   BP 121/66 (BP Location: Left Arm)   Pulse 60   Temp 98.1 F (36.7 C) (Oral)   Resp 18   Ht 5\' 8"  (1.727 m)   Wt 60.2 kg   SpO2 100%   BMI 20.18 kg/m   Intake/Output Summary (Last 24 hours) at 05/09/2021 1006 Last data filed at 05/08/2021 2000 Gross per 24 hour  Intake 240 ml  Output 300 ml  Net -60 ml    Weight change:   Physical Exam: Gen: Comfortably sitting up in bed without any supplemental oxygen CVS: Pulse regular rhythm, normal rate, S1 and S2 normal Resp: Clear breath  sounds bilaterally without rales/rhonchi Abd: Soft, flat, nontender Ext: Status post bilateral AKA with protective dressing over right stump.  No edema  Imaging: No results found.  Labs: BMET Recent Labs  Lab 05/03/21 0100 05/04/21 0204 05/05/21 0234 05/06/21 0154 05/07/21 0233 05/08/21 0246 05/09/21 0200  NA 138 140 139 142 139 139 141  K 4.0 3.5 3.5 4.3 3.3* 3.7 3.6  CL 109 110 112* 111 106 107 106  CO2 20* 18* 19* 20* 22 21* 22  GLUCOSE 159* 174* 97 97 98 153* 91  BUN 26* 25* 24* 24* 29* 32* 32*  CREATININE 2.21* 2.14* 2.12* 2.46* 2.62* 2.93* 3.12*  CALCIUM 7.0* 6.7* 6.9* 7.3* 7.0* 7.0* 6.8*  PHOS 5.1* 5.1* 5.1*  --   --   --   --    CBC Recent Labs  Lab 05/04/21 0204 05/06/21 0154  WBC 14.6* 13.1*  NEUTROABS 10.7*  --   HGB 8.7* 9.4*  HCT 27.6* 28.9*  MCV 88.2 86.8  PLT 482* 519*    Medications:     amiodarone  400 mg Oral BID   apixaban  5 mg Oral BID   atorvastatin  40 mg Oral Daily   calcium carbonate  1,250 mg Oral QHS   cholecalciferol  1,000 Units Oral Daily   feeding supplement (GLUCERNA SHAKE)  237 mL Oral TID BM   folic acid  1 mg Oral Daily   hydrALAZINE  25 mg Oral Q8H   insulin aspart  0-5 Units Subcutaneous QHS   insulin aspart  0-9 Units Subcutaneous TID WC  isosorbide mononitrate  15 mg Oral Daily   levothyroxine  100 mcg Oral QAC breakfast   metoprolol succinate  100 mg Oral Daily   multivitamin with minerals  1 tablet Oral Daily   pantoprazole  40 mg Oral BID   sertraline  50 mg Oral QHS   sodium bicarbonate  1,300 mg Oral TID   thiamine  100 mg Oral Daily   Elmarie Shiley, MD 05/09/2021, 10:06 AM

## 2021-05-10 DIAGNOSIS — F1910 Other psychoactive substance abuse, uncomplicated: Secondary | ICD-10-CM | POA: Diagnosis not present

## 2021-05-10 DIAGNOSIS — Z89611 Acquired absence of right leg above knee: Secondary | ICD-10-CM | POA: Diagnosis not present

## 2021-05-10 DIAGNOSIS — Z89612 Acquired absence of left leg above knee: Secondary | ICD-10-CM | POA: Diagnosis not present

## 2021-05-10 DIAGNOSIS — I4891 Unspecified atrial fibrillation: Secondary | ICD-10-CM | POA: Diagnosis not present

## 2021-05-10 DIAGNOSIS — I10 Essential (primary) hypertension: Secondary | ICD-10-CM | POA: Diagnosis not present

## 2021-05-10 DIAGNOSIS — E039 Hypothyroidism, unspecified: Secondary | ICD-10-CM | POA: Diagnosis not present

## 2021-05-10 DIAGNOSIS — Z794 Long term (current) use of insulin: Secondary | ICD-10-CM | POA: Diagnosis not present

## 2021-05-10 DIAGNOSIS — N179 Acute kidney failure, unspecified: Secondary | ICD-10-CM | POA: Diagnosis not present

## 2021-05-10 DIAGNOSIS — E1169 Type 2 diabetes mellitus with other specified complication: Secondary | ICD-10-CM | POA: Diagnosis not present

## 2021-05-10 DIAGNOSIS — E785 Hyperlipidemia, unspecified: Secondary | ICD-10-CM | POA: Diagnosis not present

## 2021-05-10 LAB — RENAL FUNCTION PANEL
Albumin: 2.6 g/dL — ABNORMAL LOW (ref 3.5–5.0)
Anion gap: 11 (ref 5–15)
BUN: 32 mg/dL — ABNORMAL HIGH (ref 8–23)
CO2: 21 mmol/L — ABNORMAL LOW (ref 22–32)
Calcium: 7 mg/dL — ABNORMAL LOW (ref 8.9–10.3)
Chloride: 108 mmol/L (ref 98–111)
Creatinine, Ser: 3.06 mg/dL — ABNORMAL HIGH (ref 0.61–1.24)
GFR, Estimated: 22 mL/min — ABNORMAL LOW (ref >60)
Glucose, Bld: 123 mg/dL — ABNORMAL HIGH (ref 70–99)
Phosphorus: 5.5 mg/dL — ABNORMAL HIGH (ref 2.5–4.6)
Potassium: 3.3 mmol/L — ABNORMAL LOW (ref 3.5–5.1)
Sodium: 140 mmol/L (ref 135–145)

## 2021-05-10 LAB — CBC
HCT: 28.5 % — ABNORMAL LOW (ref 39.0–52.0)
Hemoglobin: 9.1 g/dL — ABNORMAL LOW (ref 13.0–17.0)
MCH: 27.4 pg (ref 26.0–34.0)
MCHC: 31.9 g/dL (ref 30.0–36.0)
MCV: 85.8 fL (ref 80.0–100.0)
Platelets: 549 10*3/uL — ABNORMAL HIGH (ref 150–400)
RBC: 3.32 MIL/uL — ABNORMAL LOW (ref 4.22–5.81)
RDW: 14.6 % (ref 11.5–15.5)
WBC: 11.5 10*3/uL — ABNORMAL HIGH (ref 4.0–10.5)
nRBC: 0 % (ref 0.0–0.2)

## 2021-05-10 LAB — GLUCOSE, CAPILLARY
Glucose-Capillary: 104 mg/dL — ABNORMAL HIGH (ref 70–99)
Glucose-Capillary: 162 mg/dL — ABNORMAL HIGH (ref 70–99)
Glucose-Capillary: 136 mg/dL — ABNORMAL HIGH (ref 70–99)
Glucose-Capillary: 177 mg/dL — ABNORMAL HIGH (ref 70–99)

## 2021-05-10 MED ORDER — POTASSIUM CHLORIDE CRYS ER 20 MEQ PO TBCR
20.0000 meq | EXTENDED_RELEASE_TABLET | Freq: Once | ORAL | Status: AC
  Administered 2021-05-10: 08:00:00 20 meq via ORAL
  Filled 2021-05-10: qty 1

## 2021-05-10 MED ORDER — SODIUM BICARBONATE 650 MG PO TABS
1300.0000 mg | ORAL_TABLET | Freq: Two times a day (BID) | ORAL | Status: DC
  Administered 2021-05-10 – 2021-05-11 (×2): 1300 mg via ORAL
  Filled 2021-05-10 (×2): qty 2

## 2021-05-10 NOTE — Care Management Important Message (Signed)
Important Message  Patient Details  Name: Todd [name] MRN: [id number] Date of Birth: [birth year]   Medicare Important Message Given:  Yes     [name] 05/10/2021, 10:58 AM

## 2021-05-10 NOTE — Progress Notes (Signed)
Pt had 4 beat svt @0455 . EKG QTC prolonged. >500 Notified provider & charge nurse Sharee Pimple.

## 2021-05-10 NOTE — Progress Notes (Addendum)
PROGRESS NOTE  Todd [name]  CNO:[id number] DOB: [birth year] DOA: 04/19/2021 PCP: [name], FNP  Brief Narrative: Todd [name] is a 65 y.o. male recently admitted from 01/10/2021 to 01/15/2021 due to chronic A. fib with RVR, with medical history significant for essential hypertension, hypothyroidism, type 2 diabetes mellitus, chronic atrial fibrillation, status post bilateral AKA, history of polysubstance abuse (alcohol, tobacco and cocaine) who presents to [facility] ED from [facility] via EMS due to shortness of breath.  He was noted to be hypoxic, in the 80% on room air at the SNF, neb treatment was provided and patient was placed on a supplemental oxygen via [place] at 2 LPM with minimal improvement.  EMS was activated and on arrival, O2 supplementation was increased to 4 LPM en route to the hospital.  Work-up revealed acute hypoxic respiratory failure, right-sided community-acquired pneumonia, mild pulmonary edema, elevated troponin, elevated BNP greater than 800, acute on chronic diastolic CHF, AKI, non-anion gap metabolic acidosis, and electrolyte abnormalities. Echocardiogram revealed LV systolic dysfunction and monitoring detected RVR.  Initially treated with aggressive IV diuresis for decompensated CHF. Course complicated by sustained VT, Cardiology consulted, ischemic work-up precluded by development of AKI due to hemodynamics from IV diuresis/arrhythmias and relative hypotension. Nephrology consulted, diuretics were held with improvement in AKI, then he developed worsening dyspnea/hypoxia and IV Lasix again resumed with recurrence of worsening AKI.  Nephrology/cardiology continue to assist with management.  Assessment & Plan: Principal Problem:   CAP (community acquired pneumonia) Active Problems:   Essential hypertension   Acquired hypothyroidism   Elevated troponin   Acute respiratory failure with hypoxia (HCC)   Hyperglycemia due to diabetes mellitus (HCC)   Leukocytosis    Reactive thrombocytosis   S/P AKA (above knee amputation) bilateral (HCC)   AKI (acute kidney injury) ([place])   Atrial fibrillation, chronic (HCC)   Hypomagnesemia   Elevated brain natriuretic peptide (BNP) level   Polysubstance abuse (HCC)   Noncompliance with medication regimen   Chronic diastolic CHF (congestive heart failure) (HCC)   Hypoalbuminemia due to protein-calorie malnutrition (HCC)   GERD (gastroesophageal reflux disease)   SOB (shortness of breath)  Acute hypoxic respiratory failure: Presumed to be due to CHF and right-sided pneumonia, COPD exacerbation. CHF suspected primary component based on progressive infiltrates that quickly resolved with diuresis. CXR 11/24 shows significant improvement after diuresis.  - Hypoxia seems to recur when sleeping, so possibly an element of OSA. Clubbing also noted suggestive of a more long term possibly occult hypoxemia.  - Currently not hypoxemic. Will assess for need of oxygen at discharge.   NSVT: Suspected ischemic etiology per cardiology.  - Will require work up (cMRI, LHC) though renal impairment is currently prohibitive.  - Continue amiodarone, s/p load, changed to 457m daily 11/28 by cardiology. Continue metoprolol - Continue cardiac monitoring.  - Keep K >4, Mg >2.  - Difficulty ICD candidate with substance abuse. Defer need for lifevest to cardiology.   PAF, multifocal atrial tachycardia: Recent admit for RVR.  - Continue metoprolol succinate, increased to 1085m Now in sinus bradycardia mostly. - Continue eliquis for anticoagulation - Maintain K >4, Mg >2.   Acute on chronic HFrEF, HTN: LVEF 45-50% without RWMA, +LVH, normal IVC. Severe LAE, moderate RAE. BNP elevated. ESR chronically elevated, remains so here. Not entirely clear clinical significance. - Cardiology consulted, deferring ischemic eval for now, defer to outpatient setting.  - Converted norvasc to bidil. Continue metoprolol succinate.  - We've alternated between  diuresis for CHF and holding  diuresis for AKI. Currently euvolemic.   AKI with NAGMA: No abnormality on U/S, though did have trace perinephric fluid on right. Bland sediment with definite proteinuria 1.03. FENa not consistent with prerenal azotemia at 1.78%. Note history of worse AKIs previously.  - Holding diuresis as above. Monitor CrCl. - Continue bicarbonate tabs. Appreciate nephrology guidance and assurance of outpatient follow up.  Demand myocardial ischemia: Mild troponin elevation with reassuring delta no anginal complaints.  - Defer w/u to cardiology  Right-sided CAP: Completed azithromycin x5 days. Stopped vanc w/nephrotoxicity and negative MRSA PCR. Completed 7 days of zosyn. Blood Cx's NGTD x5 days, no sputum culture obtained.  COPD with exacerbation: Back to baseline. - Continue inhaled BDs, avoiding steroids due to severe hyperglycemia. Exacerbation has resolved.  Uncontrolled T2DM with steroid-induced hyperglycemia: HbA1c 7.9%.  - Insulin to continue TIDWC + SSI novolog.  Polysubstance abuse: Previously +alcohol, tobacco, cocaine.  - Cessation counseling.  - Continue nicotine patch.   Depression:  - Restarted SSRI. Recommend pursuing outpatient therapy. No indication for inpatient psychiatry consultation at this time.  Hypocalcemia:  - Continue supplementation.  - Vitamin D 29.7. Supplement.  Hypothyroidism: Repeat TSH is 0.315, last was 20.52 in September 2022 which was improved from 189 in July in setting of medication nonadherence. Free T4 is 1.12 which is the ULN.  - In setting of AFib/MAT, we did decrease synthroid dose 189mg > 1066m.   Anemia of chronic disease: Having a decline in hgb from admission, though all cell lines are down and hgb was above baseline on admission.  - Hemoglobin has stabilized somewhat near baseline ~9g/dl Aranesp ordered 11/28 per nephrology  HTN: Tx as above  PVD s/p bilateral AKA (2018-2019):  - Continue statin,  anticoagulation  GERD:  - Continue PPI  Suspected malnutrition with hypoalbuminemia. - Dietitian consulted.   DVT prophylaxis: Eliquis Code Status: Full Family Communication: None at bedside today. Disposition Plan:  Status is: Inpatient  Remains inpatient appropriate because: requires treatment for acute CHF, AKI, and arrhythmias.   Consultants:  Cardiology Nephrology  Procedures:  Echocardiogram  Antimicrobials: Ceftriaxone, azithromycin > vancomycin, zosyn.  Subjective: No complaints. Sparse historian.   Objective: Vitals:   05/09/21 2157 05/09/21 2200 05/10/21 0544 05/10/21 0822  BP: 116/66 116/66 124/65 (!) 110/59  Pulse:  61 61 60  Resp:  18 18   Temp:  98.6 F (37 C) 98.6 F (37 C)   TempSrc:  Oral Oral   SpO2:  100% 100%   Weight:   61.7 kg   Height:        Intake/Output Summary (Last 24 hours) at 05/10/2021 1553 Last data filed at 05/09/2021 1700 Gross per 24 hour  Intake --  Output 350 ml  Net -350 ml    Filed Weights   05/07/21 0550 05/08/21 0500 05/10/21 0544  Weight: 56.2 kg 60.2 kg 61.7 kg   Gen: 89.o. male in no distress Pulm: Nonlabored breathing room air. Clear. CV: Regular rate and rhythm. No murmur, rub, or gallop. No JVD, no dependent edema. GI: Abdomen soft, non-tender, non-distended, with normoactive bowel sounds.  Ext: Warm, BL AKA Skin: No new rashes, lesions or ulcers on visualized skin. Neuro: Alert and oriented. No focal neurological deficits. Psych: Judgement and insight appear fair. Mood euthymic & affect congruent. Behavior is appropriate.    Data Reviewed: I have personally reviewed following labs and imaging studies  CBC: Recent Labs  Lab 05/04/21 0204 05/06/21 0154 05/10/21 0226  WBC 14.6* 13.1* 11.5*  NEUTROABS  10.7*  --   --   HGB 8.7* 9.4* 9.1*  HCT 27.6* 28.9* 28.5*  MCV 88.2 86.8 85.8  PLT 482* 519* 301*   Basic Metabolic Panel: Recent Labs  Lab 05/04/21 0204 05/05/21 0234 05/06/21 0154  05/07/21 0233 05/08/21 0246 05/09/21 0200 05/10/21 0226  NA 140 139 142 139 139 141 140  K 3.5 3.5 4.3 3.3* 3.7 3.6 3.3*  CL 110 112* 111 106 107 106 108  CO2 18* 19* 20* 22 21* 22 21*  GLUCOSE 174* 97 97 98 153* 91 123*  BUN 25* 24* 24* 29* 32* 32* 32*  CREATININE 2.14* 2.12* 2.46* 2.62* 2.93* 3.12* 3.06*  CALCIUM 6.7* 6.9* 7.3* 7.0* 7.0* 6.8* 7.0*  MG 1.8 1.8 1.8  --  1.8  --   --   PHOS 5.1* 5.1*  --   --   --   --  5.5*   GFR: Estimated Creatinine Clearance: 20.7 mL/min (A) (by C-G formula based on SCr of 3.06 mg/dL (H)). Liver Function Tests: Recent Labs  Lab 05/04/21 0204 05/05/21 0234 05/10/21 0226  ALBUMIN 1.8* 1.7* 2.6*   No results for input(s): LIPASE, AMYLASE in the last 168 hours. No results for input(s): AMMONIA in the last 168 hours. Coagulation Profile: No results for input(s): INR, PROTIME in the last 168 hours.  Cardiac Enzymes: No results for input(s): CKTOTAL, CKMB, CKMBINDEX, TROPONINI in the last 168 hours. BNP (last 3 results) No results for input(s): PROBNP in the last 8760 hours. HbA1C: No results for input(s): HGBA1C in the last 72 hours.  CBG: Recent Labs  Lab 05/09/21 1136 05/09/21 1543 05/09/21 2226 05/10/21 0754 05/10/21 1126  GLUCAP 117* 132* 107* 104* 162*   Lipid Profile: No results for input(s): CHOL, HDL, LDLCALC, TRIG, CHOLHDL, LDLDIRECT in the last 72 hours. Thyroid Function Tests: No results for input(s): TSH, T4TOTAL, FREET4, T3FREE, THYROIDAB in the last 72 hours.  Anemia Panel: No results for input(s): VITAMINB12, FOLATE, FERRITIN, TIBC, IRON, RETICCTPCT in the last 72 hours. Urine analysis:    Component Value Date/Time   COLORURINE YELLOW 04/24/2021 [place] 04/24/2021 0446   APPEARANCEUR Clear 02/09/2018 1352   LABSPEC 1.018 04/24/2021 0446   PHURINE 5.0 04/24/2021 0446   GLUCOSEU NEGATIVE 04/24/2021 0446   HGBUR NEGATIVE 04/24/2021 0446   BILIRUBINUR NEGATIVE 04/24/2021 0446   BILIRUBINUR  Negative 02/09/2018 1352   KETONESUR NEGATIVE 04/24/2021 0446   PROTEINUR 100 (A) 04/24/2021 0446   NITRITE NEGATIVE 04/24/2021 0446   LEUKOCYTESUR NEGATIVE 04/24/2021 0446   Recent Results (from the past 240 hour(s))  SARS CORONAVIRUS 2 (TAT 6-24 HRS) Nasopharyngeal Nasopharyngeal Swab     Status: None   Collection Time: 05/04/21  4:46 PM   Specimen: Nasopharyngeal Swab  Result Value Ref Range Status   SARS Coronavirus 2 NEGATIVE NEGATIVE Final    Comment: (NOTE) SARS-CoV-2 target nucleic acids are NOT DETECTED.  The SARS-CoV-2 RNA is generally detectable in upper and lower respiratory specimens during the acute phase of infection. Negative results do not preclude SARS-CoV-2 infection, do not rule out co-infections with other pathogens, and should not be used as the sole basis for treatment or other patient management decisions. Negative results must be combined with clinical observations, patient history, and epidemiological information. The expected result is Negative.  Fact Sheet for Patients: SugarRoll.be  Fact Sheet for Healthcare Providers: https://www.woods-mathews.com/  This test is not yet approved or cleared by the [place] FDA and  has been authorized for detection  and/or diagnosis of SARS-CoV-2 by FDA under an Emergency Use Authorization (EUA). This EUA will remain  in effect (meaning this test can be used) for the duration of the COVID-19 declaration under Se ction 564(b)(1) of the Act, 21 U.S.C. section 360bbb-3(b)(1), unless the authorization is terminated or revoked sooner.  Performed at [facility], [place] [address], [place], [place] [address]        Radiology Studies: No results found.  Scheduled Meds:  amiodarone  400 mg Oral BID   apixaban  5 mg Oral BID   atorvastatin  40 mg Oral Daily   calcium carbonate  1,250 mg Oral QHS   cholecalciferol  1,000 Units Oral Daily   feeding supplement  (GLUCERNA SHAKE)  237 mL Oral TID BM   folic acid  1 mg Oral Daily   hydrALAZINE  25 mg Oral Q8H   insulin aspart  0-5 Units Subcutaneous QHS   insulin aspart  0-9 Units Subcutaneous TID WC   isosorbide mononitrate  15 mg Oral Daily   levothyroxine  100 mcg Oral QAC breakfast   metoprolol succinate  100 mg Oral Daily   multivitamin with minerals  1 tablet Oral Daily   pantoprazole  40 mg Oral BID   sertraline  50 mg Oral QHS   sodium bicarbonate  1,300 mg Oral BID   thiamine  100 mg Oral Daily   Continuous Infusions:  sodium chloride Stopped (04/20/21 0916)     LOS: 20 days   Time spent: 25 minutes.  Patrecia Pour, MD [facility] [email or web address] 05/10/2021, 3:53 PM

## 2021-05-10 NOTE — Progress Notes (Signed)
Cardiology Progress Note  Patient ID: Todd [name] MRN: [id number] DOB: 05-16-1956 Date of Encounter: 05/10/2021  Primary Cardiologist: Minus Breeding, MD  Subjective   In interim has received albumin.  Notes no CP, SOB, palpitations.  Inpatient Medications  Scheduled Meds:  amiodarone  400 mg Oral BID   apixaban  5 mg Oral BID   atorvastatin  40 mg Oral Daily   calcium carbonate  1,250 mg Oral QHS   cholecalciferol  1,000 Units Oral Daily   feeding supplement (GLUCERNA SHAKE)  237 mL Oral TID BM   folic acid  1 mg Oral Daily   hydrALAZINE  25 mg Oral Q8H   insulin aspart  0-5 Units Subcutaneous QHS   insulin aspart  0-9 Units Subcutaneous TID WC   isosorbide mononitrate  15 mg Oral Daily   levothyroxine  100 mcg Oral QAC breakfast   metoprolol succinate  100 mg Oral Daily   multivitamin with minerals  1 tablet Oral Daily   pantoprazole  40 mg Oral BID   sertraline  50 mg Oral QHS   sodium bicarbonate  1,300 mg Oral TID   thiamine  100 mg Oral Daily   Continuous Infusions:  sodium chloride Stopped (04/20/21 0916)   PRN Meds: sodium chloride, acetaminophen, ipratropium-albuterol, nicotine   Vital Signs   Vitals:   05/09/21 2157 05/09/21 2200 05/10/21 0544 05/10/21 0822  BP: 116/66 116/66 124/65 (!) 110/59  Pulse:  61 61 60  Resp:  18 18   Temp:  98.6 F (37 C) 98.6 F (37 C)   TempSrc:  Oral Oral   SpO2:  100% 100%   Weight:   61.7 kg   Height:        Intake/Output Summary (Last 24 hours) at 05/10/2021 1225 Last data filed at 05/09/2021 1700 Gross per 24 hour  Intake --  Output 350 ml  Net -350 ml   Last 3 Weights 05/10/2021 05/08/2021 05/07/2021  Weight (lbs) 136 lb 0.4 oz 132 lb 11.5 oz 123 lb 12.8 oz  Weight (kg) 61.7 kg 60.2 kg 56.155 kg      Telemetry   4 beat run NSVT  Physical Exam   Vitals:   05/09/21 2157 05/09/21 2200 05/10/21 0544 05/10/21 0822  BP: 116/66 116/66 124/65 (!) 110/59  Pulse:  61 61 60  Resp:  18 18   Temp:  98.6  F (37 C) 98.6 F (37 C)   TempSrc:  Oral Oral   SpO2:  100% 100%   Weight:   61.7 kg   Height:        Intake/Output Summary (Last 24 hours) at 05/10/2021 1225 Last data filed at 05/09/2021 1700 Gross per 24 hour  Intake --  Output 350 ml  Net -350 ml    Last 3 Weights 05/10/2021 05/08/2021 05/07/2021  Weight (lbs) 136 lb 0.4 oz 132 lb 11.5 oz 123 lb 12.8 oz  Weight (kg) 61.7 kg 60.2 kg 56.155 kg    Body mass index is 20.68 kg/m.   Gen: no distress, appears older than stated age  Neck: No JVD  Cardiac: No Rubs or Gallops, no Murmur, normal rate +2 radial pulses Respiratory: Clear to auscultation bilaterally, normal effort, normal  respiratory rate GI: Soft, nontender, non-distended  MS: well healed stump sites bilaterally Integument: Skin feels warm Neuro:  At time of evaluation, alert and oriented to person/place/time/situation  Psych: Normal affect, patient feels well   Labs  High Sensitivity Troponin:   Recent Labs  Lab  04/19/21 0019 04/20/21 0254 04/20/21 0502 04/20/21 0639  TROPONINIHS 52* 55* 55* 56*     Cardiac EnzymesNo results for input(s): TROPONINI in the last 168 hours. No results for input(s): TROPIPOC in the last 168 hours.  Chemistry Recent Labs  Lab 05/04/21 0204 05/05/21 0234 05/06/21 0154 05/08/21 0246 05/09/21 0200 05/10/21 0226  NA 140 139   < > 139 141 140  K 3.5 3.5   < > 3.7 3.6 3.3*  CL 110 112*   < > 107 106 108  CO2 18* 19*   < > 21* 22 21*  GLUCOSE 174* 97   < > 153* 91 123*  BUN 25* 24*   < > 32* 32* 32*  CREATININE 2.14* 2.12*   < > 2.93* 3.12* 3.06*  CALCIUM 6.7* 6.9*   < > 7.0* 6.8* 7.0*  ALBUMIN 1.8* 1.7*  --   --   --  2.6*  GFRNONAA 33* 34*   < > 23* 21* 22*  ANIONGAP 12 8   < > 11 13 11    < > = values in this interval not displayed.    Hematology Recent Labs  Lab 05/04/21 0204 05/06/21 0154 05/10/21 0226  WBC 14.6* 13.1* 11.5*  RBC 3.13* 3.33* 3.32*  HGB 8.7* 9.4* 9.1*  HCT 27.6* 28.9* 28.5*  MCV 88.2 86.8  85.8  MCH 27.8 28.2 27.4  MCHC 31.5 32.5 31.9  RDW 15.3 14.9 14.6  PLT 482* 519* 549*   BNPNo results for input(s): BNP, PROBNP in the last 168 hours.  DDimer No results for input(s): DDIMER in the last 168 hours.   Radiology  No results found.  Cardiac Studies  TTE 04/20/2021  1. Left ventricular ejection fraction, by estimation, is 45 to 50%. The  left ventricle has mildly decreased function. There is moderate left  ventricular hypertrophy.   2. Right ventricular systolic function is normal. The right ventricular  size is normal.   3. Left atrial size was severely dilated.   4. Right atrial size was moderately dilated.   5. The inferior vena cava is normal in size with greater than 50%  respiratory variability, suggesting right atrial pressure of 3 mmHg.   6. Limited echo evaluate LV function.   Patient Profile  Todd [name] is a 65 y.o. male with paroxysmal atrial fibrillation, congestive heart failure, PAD status post bilateral AKA, hypertension, lipidemia, diabetes, polysubstance abuse who was admitted on 04/19/2021 with acute hypoxic respiratory failure secondary to pneumonia and congestive heart failure.  Course has been complicated by sustained ventricular tachycardia and AKI.  Assessment & Plan   #Acute systolic heart failure, EF 45-50% #Sustained VT # PAF CHADSVASC 3 on eliquis # AKI on CKD # Cocaine missuse - difficult ICD candidate given substance abuse issues.  - started amiodarone 04/29/21; has received ~ 9 grams.  05/12/21 will switch to 400 mg PO daily -AKI precludes invasive angiography, also is a bilateral amputee and has active cocaine use POA -on medical therapy; presently limited in the setting of BP - metoprolol succinate 100 mg daily,  - Imdur 15 mg daily; if BP allows this is the next medication to uptitrate given kidney - hydralazine 25 mg 3 times daily.   No ACE/ARB/Arni/MRA given acute kidney injury. - euvolemic - on ASA and lipitor (presumed AF  and CAD) - overall guarded long term prognosis  For questions or updates, please contact [place] Please consult [email or web address] for contact info under

## 2021-05-10 NOTE — TOC Progression Note (Signed)
Transition of Care [facility]) - Progression Note    Patient Details  Name: Ogden Handlin MRN: [id number] Date of Birth: Aug 11, 1955  Transition of Care [facility]) CM/SW [place], [place] Phone Number: 05/10/2021, 3:54 PM  Clinical Narrative:      CSW spoke with patients sister [name] regarding dc plan for patient. All questions answered. Patients sister to discuss possible alternate long term placement options with social worker at [place] when patient returns back to [place].Plan is for patient to return to [facility] when medically ready for dc. CSW will continue to follow and assist with patients dc planning needs.   Barriers to Discharge: Continued Medical Work up  Expected Discharge Plan and Services           Expected Discharge Date: 04/25/21                                     Social Determinants of Health (SDOH) Interventions    Readmission Risk Interventions No flowsheet data found.

## 2021-05-10 NOTE — Progress Notes (Signed)
[facility] NEPHROLOGY PROGRESS NOTE  Assessment/ Plan: Pt is a 65 y.o. yo male with history of A. fib, CHF, PAD status post bilateral AKA, HTN, HLD, DM, polysubstance abuse admitted on 11/7 with acute hypoxic respiratory failure due to pneumonia and CHF, consulted for AKI.  # Acute kidney Injury on chronic kidney disease stage IIIa: Likely hemodynamic mediated with IV diuretics/arrhythmia and relative hypotension.  UA with minimal protein, chronic and kidney ultrasound unremarkable.  The urine output is not accurately charted but he looks euvolemic on exam.  Creatinine level fluctuating but seems to be stable around 3.  He does not have any uremic symptoms to warrant dialysis however he is at risk of progression and needing dialysis in the near future.  Therefore he will need to follow with nephrology as outpatient.  I have discussed this with the patient.  #  Acute hypoxic respiratory failure: Secondary to pneumonia and congestive heart failure. He has had improvement after diuresis more than from antibiotics. Diuresis currently on hold with rising creatinine and stable respiratory status.  #  Anion gap metabolic acidosis: Secondary to acute kidney injury, currently on sodium bicarbonate supplementation.  # Anemia: Likely secondary to acute/critical illness, hemoglobin/hematocrit improving on labs this morning.  I will order a dose of Aranesp.  #  History of polysubstance abuse.  # Hypokalemia: Repleted potassium chloride.  Plan as outlined above.  Nothing further to add.  I will sign off, please call back with question.  We will arrange outpatient follow-up and call the patient.  Subjective: Seen and examined bedside.  Patient denies nausea, vomiting, chest pain, shortness of breath.  No new event.  Urine output is not accurately corrected.  Blood pressure acceptable. Objective Vital signs in last 24 hours: Vitals:   05/09/21 2157 05/09/21 2200 05/10/21 0544 05/10/21 0822   BP: 116/66 116/66 124/65 (!) 110/59  Pulse:  61 61 60  Resp:  18 18   Temp:  98.6 F (37 C) 98.6 F (37 C)   TempSrc:  Oral Oral   SpO2:  100% 100%   Weight:   61.7 kg   Height:       Weight change:   Intake/Output Summary (Last 24 hours) at 05/10/2021 1409 Last data filed at 05/09/2021 1700 Gross per 24 hour  Intake --  Output 350 ml  Net -350 ml       Labs: Basic Metabolic Panel: Recent Labs  Lab 05/04/21 0204 05/05/21 0234 05/06/21 0154 05/08/21 0246 05/09/21 0200 05/10/21 0226  NA 140 139   < > 139 141 140  K 3.5 3.5   < > 3.7 3.6 3.3*  CL 110 112*   < > 107 106 108  CO2 18* 19*   < > 21* 22 21*  GLUCOSE 174* 97   < > 153* 91 123*  BUN 25* 24*   < > 32* 32* 32*  CREATININE 2.14* 2.12*   < > 2.93* 3.12* 3.06*  CALCIUM 6.7* 6.9*   < > 7.0* 6.8* 7.0*  PHOS 5.1* 5.1*  --   --   --  5.5*   < > = values in this interval not displayed.   Liver Function Tests: Recent Labs  Lab 05/04/21 0204 05/05/21 0234 05/10/21 0226  ALBUMIN 1.8* 1.7* 2.6*   No results for input(s): LIPASE, AMYLASE in the last 168 hours. No results for input(s): AMMONIA in the last 168 hours. CBC: Recent Labs  Lab 05/04/21 0204 05/06/21 0154 05/10/21 0226  WBC  14.6* 13.1* 11.5*  NEUTROABS 10.7*  --   --   HGB 8.7* 9.4* 9.1*  HCT 27.6* 28.9* 28.5*  MCV 88.2 86.8 85.8  PLT 482* 519* 549*   Cardiac Enzymes: No results for input(s): CKTOTAL, CKMB, CKMBINDEX, TROPONINI in the last 168 hours. CBG: Recent Labs  Lab 05/09/21 1136 05/09/21 1543 05/09/21 2226 05/10/21 0754 05/10/21 1126  GLUCAP 117* 132* 107* 104* 162*    Iron Studies: No results for input(s): IRON, TIBC, TRANSFERRIN, FERRITIN in the last 72 hours. Studies/Results: No results found.  Medications: Infusions:  sodium chloride Stopped (04/20/21 0916)    Scheduled Medications:  amiodarone  400 mg Oral BID   apixaban  5 mg Oral BID   atorvastatin  40 mg Oral Daily   calcium carbonate  1,250 mg Oral QHS    cholecalciferol  1,000 Units Oral Daily   feeding supplement (GLUCERNA SHAKE)  237 mL Oral TID BM   folic acid  1 mg Oral Daily   hydrALAZINE  25 mg Oral Q8H   insulin aspart  0-5 Units Subcutaneous QHS   insulin aspart  0-9 Units Subcutaneous TID WC   isosorbide mononitrate  15 mg Oral Daily   levothyroxine  100 mcg Oral QAC breakfast   metoprolol succinate  100 mg Oral Daily   multivitamin with minerals  1 tablet Oral Daily   pantoprazole  40 mg Oral BID   sertraline  50 mg Oral QHS   sodium bicarbonate  1,300 mg Oral TID   thiamine  100 mg Oral Daily    have reviewed scheduled and prn medications.  Physical Exam: General:NAD, comfortable Heart:RRR, s1s2 nl Lungs:clear b/l, no crackle Abdomen:soft, Non-tender, non-distended Extremities: Bilateral AKA, no edema Neurology: Alert, awake and following commands  Zina Pitzer Tanna Furry 05/10/2021,2:09 PM  LOS: 20 days

## 2021-05-11 LAB — BASIC METABOLIC PANEL
Anion gap: 11 (ref 5–15)
BUN: 31 mg/dL — ABNORMAL HIGH (ref 8–23)
CO2: 19 mmol/L — ABNORMAL LOW (ref 22–32)
Calcium: 7.1 mg/dL — ABNORMAL LOW (ref 8.9–10.3)
Chloride: 108 mmol/L (ref 98–111)
Creatinine, Ser: 2.98 mg/dL — ABNORMAL HIGH (ref 0.61–1.24)
GFR, Estimated: 22 mL/min — ABNORMAL LOW (ref >60)
Glucose, Bld: 95 mg/dL (ref 70–99)
Potassium: 3.7 mmol/L (ref 3.5–5.1)
Sodium: 138 mmol/L (ref 135–145)

## 2021-05-11 LAB — GLUCOSE, CAPILLARY
Glucose-Capillary: 107 mg/dL — ABNORMAL HIGH (ref 70–99)
Glucose-Capillary: 135 mg/dL — ABNORMAL HIGH (ref 70–99)

## 2021-05-11 LAB — SARS CORONAVIRUS 2 (TAT 6-24 HRS): SARS Coronavirus 2: NEGATIVE

## 2021-05-11 MED ORDER — AMIODARONE HCL 400 MG PO TABS: 400.0000 mg | ORAL_TABLET | Freq: Every day | ORAL | Status: DC

## 2021-05-11 MED ORDER — ISOSORBIDE MONONITRATE ER 30 MG PO TB24: 15.0000 mg | ORAL_TABLET | Freq: Every day | ORAL | Status: DC

## 2021-05-11 MED ORDER — METOPROLOL SUCCINATE ER 100 MG PO TB24: 100.0000 mg | ORAL_TABLET | Freq: Every day | ORAL | Status: DC

## 2021-05-11 MED ORDER — HYDRALAZINE HCL 25 MG PO TABS
25.0000 mg | ORAL_TABLET | Freq: Three times a day (TID) | ORAL | Status: DC
Start: 2021-05-11 — End: 2022-05-11

## 2021-05-11 MED ORDER — LORAZEPAM 0.5 MG PO TABS: 0.5000 mg | ORAL_TABLET | Freq: Four times a day (QID) | ORAL | 0 refills | Status: DC | PRN

## 2021-05-11 MED ORDER — LEVOTHYROXINE SODIUM 100 MCG PO TABS: 100.0000 ug | ORAL_TABLET | Freq: Every day | ORAL | Status: DC

## 2021-05-11 NOTE — Progress Notes (Signed)
Report called in and given to [place] at [place]

## 2021-05-11 NOTE — TOC Transition Note (Signed)
Transition of Care [facility]) - CM/SW Discharge Note   Patient Details  Name: Todd [name] MRN: [id number] Date of Birth: 28-Nov-1955  Transition of Care [facility]) CM/SW Contact:  Milas Gain, [place] Phone Number: 05/11/2021, 11:38 AM   Clinical Narrative:     Patient will DC to: [place] date: 05/11/2021  Family notified: Risk analyst by: [name]  ?  Per MD patient ready for DC to [facility] . RN, patient, patient's family, and facility notified of DC. Discharge Summary sent to facility. RN given number for report tele# [phone] RM# [id number]. DC packet on chart. Ambulance transport requested for patient.  CSW signing off.   Final next level of care: Skilled Nursing Facility Barriers to Discharge: No Barriers Identified   Patient Goals and CMS Choice Patient states their goals for this hospitalization and ongoing recovery are:: SNF CMS Medicare.gov Compare Post Acute Care list provided to:: Patient Choice offered to / list presented to : Patient  Discharge Placement              Patient chooses bed at:  [facility]) Patient to be transferred to facility by: [place] Name of family member notified: [name] Patient and family notified of of transfer: 05/11/21  Discharge Plan and Services                                     Social Determinants of Health (SDOH) Interventions     Readmission Risk Interventions No flowsheet data found.

## 2021-05-11 NOTE — Discharge Summary (Signed)
Physician Discharge Summary  Errik Mitchelle RFF:[id number] DOB: [birth year] DOA: 04/19/2021  PCP: [name], FNP  Admit date: 04/19/2021 Discharge date: 05/11/2021  Admitted From: SNF Disposition: SNF   Recommendations for Outpatient Follow-up:  Follow up with PCP in 1-2 weeks, monitor BMP within next week. Follow up with cardiology and EP. Hoping to perform cardiac catheterization once renal function improves or patient begins hemodialysis Follow up with nephrology, [place] in the next few weeks.  Monitor TSH, free T4. Synthroid dose was slightly decreased (details below). Recommend repeat CXR in 4 weeks.  Home Health: NA Equipment/Devices: None new, no longer hypoxic Discharge Condition: Stable CODE STATUS: Full Diet recommendation: Heart healthy, carb-modified  Brief/Interim Summary: Raymondo Garcialopez is a 65 y.o. male recently admitted from 01/10/2021 to 01/15/2021 due to chronic A. fib with RVR, with medical history significant for essential hypertension, hypothyroidism, type 2 diabetes mellitus, chronic atrial fibrillation, status post bilateral AKA, history of polysubstance abuse (alcohol, tobacco and cocaine) who presents to [facility] ED from [facility] via EMS due to shortness of breath.  He was noted to be hypoxic, in the 80% on room air at the SNF, neb treatment was provided and patient was placed on a supplemental oxygen via [place] at 2 LPM with minimal improvement.  EMS was activated and on arrival, O2 supplementation was increased to 4 LPM en route to the hospital.  Work-up revealed acute hypoxic respiratory failure, right-sided community-acquired pneumonia, mild pulmonary edema, elevated troponin, elevated BNP greater than 800, acute on chronic diastolic CHF, AKI, non-anion gap metabolic acidosis, and electrolyte abnormalities. Echocardiogram revealed LV systolic dysfunction and monitoring detected RVR.  Initially treated with aggressive IV diuresis for  decompensated CHF. Course complicated by sustained VT, Cardiology consulted, ischemic work-up precluded by development of AKI due to hemodynamics from IV diuresis/arrhythmias and relative hypotension. Nephrology consulted, diuretics were held with improvement in AKI, then he developed worsening dyspnea/hypoxia and IV Lasix again resumed with recurrence of worsening AKI.  Nephrology/cardiology continued to assist with management and the patient ultimately has stabilized off of oxygen with acceptable renal function to facilitate discharge. Cardiology recommends no further diuretic administration as a standing order, but volume status should be monitored very closely.   Discharge Diagnoses:  Principal Problem:   CAP (community acquired pneumonia) Active Problems:   Essential hypertension   Acquired hypothyroidism   Elevated troponin   Acute respiratory failure with hypoxia (HCC)   Hyperglycemia due to diabetes mellitus (HCC)   Leukocytosis   Reactive thrombocytosis   S/P AKA (above knee amputation) bilateral (HCC)   AKI (acute kidney injury) ([place])   Atrial fibrillation, chronic (HCC)   Hypomagnesemia   Elevated brain natriuretic peptide (BNP) level   Polysubstance abuse (HCC)   Noncompliance with medication regimen   Chronic diastolic CHF (congestive heart failure) (HCC)   Hypoalbuminemia due to protein-calorie malnutrition (HCC)   GERD (gastroesophageal reflux disease)   SOB (shortness of breath)  Acute hypoxic respiratory failure: Presumed to be due to CHF and right-sided pneumonia, COPD exacerbation. CHF suspected primary component based on progressive infiltrates that quickly resolved with diuresis. CXR 11/24 shows significant improvement after diuresis.  - Hypoxia seems to recur when sleeping, so possibly an element of OSA. Clubbing also noted suggestive of a more long term possibly occult hypoxemia.  - Currently not hypoxemic.     NSVT: Suspected ischemic etiology per cardiology.  -  Will require work up (cMRI, LHC) though renal impairment is currently prohibitive.  - Continue amiodarone,  s/p load, changed to 453m daily by cardiology. Continue metoprolol succinate. - Keep K >4, Mg >2.  - Difficulty ICD candidate with substance abuse. Has been quiescent  on amiodarone, so no lifevest currently recommended.    PAF, multifocal atrial tachycardia: Recent admit for RVR.  - Continue metoprolol succinate, increased to 1074m Now in sinus bradycardia mostly. - Continue eliquis for anticoagulation - Maintain K >4, Mg >2.    Acute on chronic HFrEF, HTN: LVEF 45-50% without RWMA, +LVH, normal IVC. Severe LAE, moderate RAE. BNP elevated. ESR chronically elevated, remains so here. Not entirely clear clinical significance. - Cardiology deferring ischemic eval for now, defer to outpatient setting once renal function improves or pt initiates HD.   - We've alternated between diuresis for CHF and holding diuresis for AKI. Currently euvolemic.  - Final medication recommendations from cardiology include: Amiodarone 40076maily (starting 11/30), metoprolol succinate 100m5m, Imdur 15mg1m(stopped norvasc), hydralazine 25mg 64m Lipitor 40mg q16md Eliquis 5 mg BID   AKI with NAGMA: No abnormality on U/S, though did have trace perinephric fluid on right. Bland sediment with definite proteinuria 1.03. FENa not consistent with prerenal azotemia at 1.78%. Note history of worse AKIs previously.  - Holding diuresis as above. Monitor CrCl. - Continue bicarbonate tabs.  - Appreciate nephrology guidance and assurance of outpatient follow up.   Demand myocardial ischemia: Mild troponin elevation with reassuring delta no anginal complaints.  - Defer w/u to cardiology   Right-sided CAP: Completed azithromycin x5 days. Stopped vanc w/nephrotoxicity and negative MRSA PCR. Completed 7 days of zosyn. Blood Cx's NGTD x5 days, no sputum culture obtained. Recommend repeat CXR after resolution.   COPD with  exacerbation: Back to baseline. - Continue inhaled BDs. Exacerbation has resolved.   Uncontrolled T2DM with steroid-induced hyperglycemia: HbA1c 7.9%.  - Continue titrating insulin as indicated at SNF.   Polysubstance abuse: Previously +alcohol, tobacco, cocaine.  - Cessation counseling.    Depression:  - Restarted SSRI. Recommend pursuing outpatient therapy. No indication for inpatient psychiatry consultation at this time.   Hypocalcemia:  - Continue supplementation.  - Vitamin D 29.7. Continue supplement.   Hypothyroidism: Repeat TSH is 0.315, last was 20.52 in September 2022 which was improved from 189 in July in setting of medication nonadherence. Free T4 is 1.12 which is the ULN.  - In setting of AFib/MAT, we did decrease synthroid dose 112mcg >35mmcg.  69memia of chronic disease: Having a decline in hgb from admission, though all cell lines are down and hgb was above baseline on admission.  - Hemoglobin has stabilized somewhat near baseline ~9g/dl. Aranesp ordered 11/28 per nephrology   HTN: Tx as above   PVD s/p bilateral AKA (2018-2019):  - Continue statin, anticoagulation   GERD:  - Continue PPI   Mild malnutrition with hypoalbuminemia. - Supplement protein  Discharge Instructions  Allergies as of 05/11/2021       Reactions   Lisinopril Other (See Comments)   Hyperkalemia / Renal failure        Medication List     STOP taking these medications    amLODipine 5 MG tablet Commonly known as: NORVASC       TAKE these medications    acetaminophen 325 MG tablet Commonly known as: TYLENOL Take 2 tablets (650 mg total) by mouth every 6 (six) hours as needed for mild pain (or Fever >/= 101).   allopurinol 100 MG tablet Commonly known as: ZYLOPRIM Take 1 tablet (100 mg total)  by mouth daily. What changed: how much to take   amiodarone 400 MG tablet Commonly known as: PACERONE Take 1 tablet (400 mg total) by mouth daily.   apixaban 5 MG Tabs  tablet Commonly known as: ELIQUIS Take 1 tablet (5 mg total) by mouth 2 (two) times daily.   atorvastatin 40 MG tablet Commonly known as: LIPITOR TAKE 1 TABLET BY MOUTH ONCE DAILY AT 6 PM   calcium carbonate 500 MG chewable tablet Commonly known as: TUMS - dosed in mg elemental calcium Chew 2 tablets (400 mg of elemental calcium total) by mouth 2 (two) times daily with a meal. What changed: when to take this   cetirizine 10 MG tablet Commonly known as: ZYRTEC Take 1 tablet (10 mg total) by mouth daily as needed for allergies. For allergy symptoms   cholecalciferol 25 MCG (1000 UNIT) tablet Commonly known as: VITAMIN D3 Take 1,000 Units by mouth daily.   folic acid 1 MG tablet Commonly known as: FOLVITE Take 1 tablet (1 mg total) by mouth daily.   Gerhardt's butt cream Crea Apply 1 application topically 3 (three) times daily.   glucose blood test strip Commonly known as: Accu-Chek Aviva Plus Check blood sugars four times a day   hydrALAZINE 25 MG tablet Commonly known as: APRESOLINE Take 1 tablet (25 mg total) by mouth 3 (three) times daily.   insulin lispro 100 UNIT/ML KwikPen Commonly known as: HUMALOG Inject 2-10 Units into the skin. Sliding scale 200-250 2units, 251-300 4units, 301-350 6units, 351-400 8units,401-450 10units   isosorbide mononitrate 30 MG 24 hr tablet Commonly known as: IMDUR Take 0.5 tablets (15 mg total) by mouth daily. Start taking on: May 12, 2021   levothyroxine 100 MCG tablet Commonly known as: SYNTHROID Take 1 tablet (100 mcg total) by mouth daily before breakfast. Start taking on: May 12, 2021 What changed:  medication strength how much to take   LORazepam 0.5 MG tablet Commonly known as: ATIVAN Take 1 tablet (0.5 mg total) by mouth every 6 (six) hours as needed.   megestrol 400 MG/10ML suspension Commonly known as: MEGACE Take 10 mLs (400 mg total) by mouth 2 (two) times daily. For appetite stimulation   metoprolol  succinate 100 MG 24 hr tablet Commonly known as: TOPROL-XL Take 1 tablet (100 mg total) by mouth daily. Take with or immediately following a meal. Start taking on: May 12, 2021 What changed:  medication strength how much to take additional instructions   multivitamin with minerals Tabs tablet Take 1 tablet by mouth daily.   Nutritional Shake High Protein Liqd Take 30 mLs by mouth daily.   pantoprazole 40 MG tablet Commonly known as: PROTONIX Take 1 tablet (40 mg total) by mouth 2 (two) times daily.   polyethylene glycol 17 g packet Commonly known as: MIRALAX / GLYCOLAX Take 17 g by mouth daily.   sertraline 50 MG tablet Commonly known as: ZOLOFT Take 1 tablet (50 mg total) by mouth at bedtime.   silver sulfADIAZINE 1 % cream Commonly known as: SILVADENE Apply 1 application topically daily. Apply to buttock   sodium bicarbonate 650 MG tablet Take 650 mg by mouth 3 (three) times daily.   thiamine 100 MG tablet Take 1 tablet (100 mg total) by mouth daily.        Follow-up Information     [place], Kling, PA-C Follow up on 05/18/2021.   Specialty: Physician Assistant Why: at 10:20 AM Contact information: [address] [place] [address]  [phone]         Minus Breeding, MD Follow up on 05/24/2021.   Specialty: Cardiology Why: at 8:25 AM with his Nurse Practitioner, Dr. Bunnie Domino Contact information: [address] [place] [address] [place] [address] [place], [place], NP Follow up on 05/27/2021.   Specialty: Nurse Practitioner Why: 9:30am Contact information: [address]  [place] [address] [phone]                Allergies  Allergen Reactions   Lisinopril Other (See Comments)    Hyperkalemia / Renal failure    Consultations: Cardiology Nephrology EP  Procedures/Studies: DG Chest 2 View  Result Date: 05/06/2021 CLINICAL DATA:  Shortness of breath EXAM: CHEST  - 2 VIEW COMPARISON:  Yesterday FINDINGS: Indistinct bilateral airspace disease. Improved aeration from yesterday. Trace pleural fluid. No pneumothorax. Postoperative thoracic inlet. Normal heart size. IMPRESSION: Improving bilateral airspace disease. Electronically Signed   By: Jorje Guild M.D.   On: 05/06/2021 06:22   US RENAL  Result Date: 04/20/2021 CLINICAL DATA:  Acute kidney injury EXAM: RENAL / URINARY TRACT ULTRASOUND COMPLETE COMPARISON:  10/26/2017 FINDINGS: Right Kidney: Renal measurements: 11.8 x 5.7 x 6.4 cm = volume: 226.4 mL. Echogenicity within normal limits. No mass or hydronephrosis visualized. Trace right perinephric fluid. Left Kidney: Renal measurements: 11.6 x 6.2 x 6.4 cm = volume: 243.1 mL. Echogenicity within normal limits. No mass or hydronephrosis visualized. Bladder: Appears normal for degree of bladder distention. Other: None. IMPRESSION: Negative examination. Electronically Signed   By: Donavan Foil M.D.   On: 04/20/2021 15:42   DG CHEST PORT 1 VIEW  Result Date: 05/05/2021 CLINICAL DATA:  Shortness of breath evaluation. EXAM: PORTABLE CHEST 1 VIEW COMPARISON:  Radiographs 04/28/2021 and 04/26/2021. FINDINGS: 1053 hours. The heart size and mediastinal contours are stable. There are surgical clips at the thoracic inlet. There are significantly worsening diffuse nodular airspace opacities in both lungs which had improved on the most recent study of 1 week ago. No pneumothorax or significant pleural effusion. The bones appear stable. Telemetry leads overlie the chest. IMPRESSION: Recurrent bilateral airspace opacities suspicious for recurrent multilobar pneumonia. Given fluctuation on recent radiographs, considerations would include pulmonary hemorrhage and atypical inflammation. Electronically Signed   By: Richardean Sale M.D.   On: 05/05/2021 11:06   DG CHEST PORT 1 VIEW  Result Date: 04/28/2021 CLINICAL DATA:  Shortness of breath EXAM: PORTABLE CHEST 1 VIEW  COMPARISON:  Previous studies including the examination of 04/26/2021 FINDINGS: Transverse diameter of heart is increased. There is partial clearing of increased interstitial and alveolar markings in both lungs suggesting resolving pulmonary edema. There is mild residual prominence of interstitial markings in the parahilar regions and lower lung fields. There are no new focal infiltrates. There is no significant pleural effusion or pneumothorax. Surgical clips are seen in the thyroid bed. IMPRESSION: There is significant interval decrease in interstitial and alveolar densities in both lungs suggesting resolving pulmonary edema. There is residual prominence of interstitial markings in the parahilar regions and lower lung fields suggesting residual pulmonary edema. No new focal pulmonary consolidation is seen. Electronically Signed   By: Elmer Picker M.D.   On: 04/28/2021 08:13   DG CHEST PORT 1 VIEW  Result Date: 04/26/2021 CLINICAL DATA:  Acute respiratory failure with hypoxia. EXAM: PORTABLE CHEST 1 VIEW COMPARISON:  04/25/2021 FINDINGS: Heart size is accentuated by technique and stable. Patchy infiltrates are again identified throughout the lungs,  some interval improvement in aeration. RIGHT pleural effusion is small. IMPRESSION: Slightly improved aeration. Electronically Signed   By: Nolon Nations M.D.   On: 04/26/2021 10:42   DG CHEST PORT 1 VIEW  Result Date: 04/25/2021 CLINICAL DATA:  Acute respiratory failure EXAM: PORTABLE CHEST 1 VIEW COMPARISON:  Chest x-ray dated April 22, 2021 FINDINGS: Cardiac and mediastinal contours are unchanged. Increased right greater than left heterogeneous opacities. No large pleural effusion or evidence of pneumothorax. IMPRESSION: Increased right greater than left heterogeneous opacities, concerning for worsening edema multifocal infection. Electronically Signed   By: Yetta Glassman M.D.   On: 04/25/2021 09:40   DG CHEST PORT 1 VIEW  Result Date:  04/22/2021 CLINICAL DATA:  Shortness of breath EXAM: PORTABLE CHEST 1 VIEW COMPARISON:  Three days ago FINDINGS: Severe airspace disease asymmetric to the right. Borderline heart size. No visible effusion or pneumothorax. IMPRESSION: Extensive and worsened airspace disease. Electronically Signed   By: Jorje Guild M.D.   On: 04/22/2021 05:52   DG Chest Port 1 View  Result Date: 04/20/2021 CLINICAL DATA:  Shortness of breath EXAM: PORTABLE CHEST 1 VIEW COMPARISON:  01/10/2021, 12/04/2020 FINDINGS: Normal cardiac size. Mild diffuse interstitial thickening with mild ground-glass opacity in the right thorax. No pleural effusion or pneumothorax. Aortic atherosclerosis. Postsurgical changes at the thoracic inlet. IMPRESSION: 1. Mild ground-glass opacity in the right thorax, potentially due to pneumonia. 2. Mild background chronic bronchitic change Electronically Signed   By: Donavan Foil M.D.   On: 04/20/2021 00:05   ECHOCARDIOGRAM LIMITED  Result Date: 04/20/2021    ECHOCARDIOGRAM LIMITED REPORT   Patient Name:   NORFLEET CAPERS Date of Exam: 04/20/2021 Medical Rec #:  [id number]       Height:       68.0 in Accession #:    [id number]      Weight:       130.1 lb Date of Birth:  1955/11/13      BSA:          1.702 m Patient Age:    51 years        BP:           154/81 mmHg Patient Gender: M               HR:           87 bpm. Exam Location:  [facility] Procedure: Limited Echo and Intracardiac Opacification Agent Indications:    CHF-Acute Diastolic  History:        Patient has prior history of Echocardiogram examinations, most                 recent 12/27/2020. CHF, Arrythmias:Atrial Fibrillation,                 Signs/Symptoms:Syncope; Risk Factors:Current Smoker,                 Hypertension, Diabetes and Dyslipidemia. Cocaine and Alcohol                 abuse.  Sonographer:    Wenda Low Referring Phys: [id number] OLADAPO ADEFESO  Sonographer Comments: Technically challenging study due to limited acoustic  windows. IMPRESSIONS  1. Left ventricular ejection fraction, by estimation, is 45 to 50%. The left ventricle has mildly decreased function. There is moderate left ventricular hypertrophy.  2. Right ventricular systolic function is normal. The right ventricular size is normal.  3. Left atrial size was severely dilated.  4. Right atrial size was moderately  dilated.  5. The inferior vena cava is normal in size with greater than 50% respiratory variability, suggesting right atrial pressure of 3 mmHg.  6. Limited echo evaluate LV function. FINDINGS  Left Ventricle: Left ventricular ejection fraction, by estimation, is 45 to 50%. The left ventricle has mildly decreased function. Definity contrast agent was given IV to delineate the left ventricular endocardial borders. There is moderate left ventricular hypertrophy. Right Ventricle: The right ventricular size is normal. Right vetricular wall thickness was not well visualized. Right ventricular systolic function is normal. Left Atrium: Left atrial size was severely dilated. Right Atrium: Right atrial size was moderately dilated. Pericardium: There is no evidence of pericardial effusion. Venous: The inferior vena cava is normal in size with greater than 50% respiratory variability, suggesting right atrial pressure of 3 mmHg. LEFT VENTRICLE PLAX 2D LVIDd:         4.90 cm LVIDs:         4.10 cm LV PW:         1.30 cm LV IVS:        1.30 cm LVOT diam:     2.00 cm LVOT Area:     3.14 cm  RIGHT VENTRICLE RV Basal diam:  3.60 cm RV Mid diam:    2.90 cm TAPSE (M-mode): 2.3 cm LEFT ATRIUM              Index        RIGHT ATRIUM           Index LA diam:        4.10 cm  2.41 cm/m   RA Area:     20.70 cm LA Vol (A2C):   127.0 ml 74.61 ml/m  RA Volume:   63.10 ml  37.07 ml/m LA Vol (A4C):   65.8 ml  38.66 ml/m LA Biplane Vol: 96.6 ml  56.75 ml/m   SHUNTS Systemic Diam: 2.00 cm Carlyle Dolly MD Electronically signed by Carlyle Dolly MD Signature Date/Time: 04/20/2021/11:20:36  AM    Final     Subjective: Feels well, no complaints. Excited about getting to leave the hospital. No dyspnea or pain. Eating well, having BMs. No bleeding reported.   Discharge Exam: Vitals:   05/11/21 0445 05/11/21 0842  BP: 130/79 123/69  Pulse: 60 (!) 58  Resp: 19   Temp: 98.4 F (36.9 C)   SpO2: 97%    General: Pt is alert, awake, not in acute distress Cardiovascular: RRR, S1/S2 +, no rubs, no gallops Respiratory: CTA bilaterally, no wheezing, no rhonchi Abdominal: Soft, NT, ND, bowel sounds + Extremities: No dependent edema when noting bilateral AKA's, +UE clubbing, no cyanosis  Labs: BNP (last 3 results) Recent Labs    04/19/21 0019  BNP 500.9*   Basic Metabolic Panel: Recent Labs  Lab 05/05/21 0234 05/06/21 0154 05/07/21 0233 05/08/21 0246 05/09/21 0200 05/10/21 0226 05/11/21 0059  NA 139 142 139 139 141 140 138  K 3.5 4.3 3.3* 3.7 3.6 3.3* 3.7  CL 112* 111 106 107 106 108 108  CO2 19* 20* 22 21* 22 21* 19*  GLUCOSE 97 97 98 153* 91 123* 95  BUN 24* 24* 29* 32* 32* 32* 31*  CREATININE 2.12* 2.46* 2.62* 2.93* 3.12* 3.06* 2.98*  CALCIUM 6.9* 7.3* 7.0* 7.0* 6.8* 7.0* 7.1*  MG 1.8 1.8  --  1.8  --   --   --   PHOS 5.1*  --   --   --   --  5.5*  --  Liver Function Tests: Recent Labs  Lab 05/05/21 0234 05/10/21 0226  ALBUMIN 1.7* 2.6*   No results for input(s): LIPASE, AMYLASE in the last 168 hours. No results for input(s): AMMONIA in the last 168 hours. CBC: Recent Labs  Lab 05/06/21 0154 05/10/21 0226  WBC 13.1* 11.5*  HGB 9.4* 9.1*  HCT 28.9* 28.5*  MCV 86.8 85.8  PLT 519* 549*   Cardiac Enzymes: No results for input(s): CKTOTAL, CKMB, CKMBINDEX, TROPONINI in the last 168 hours. BNP: Invalid input(s): POCBNP CBG: Recent Labs  Lab 05/10/21 0754 05/10/21 1126 05/10/21 1642 05/10/21 2116 05/11/21 0741  GLUCAP 104* 162* 136* 177* 107*   D-Dimer No results for input(s): DDIMER in the last 72 hours. Hgb A1c No results for  input(s): HGBA1C in the last 72 hours. Lipid Profile No results for input(s): CHOL, HDL, LDLCALC, TRIG, CHOLHDL, LDLDIRECT in the last 72 hours. Thyroid function studies No results for input(s): TSH, T4TOTAL, T3FREE, THYROIDAB in the last 72 hours.  Invalid input(s): FREET3 Anemia work up No results for input(s): VITAMINB12, FOLATE, FERRITIN, TIBC, IRON, RETICCTPCT in the last 72 hours. Urinalysis    Component Value Date/Time   COLORURINE YELLOW 04/24/2021 [place] 04/24/2021 0446   APPEARANCEUR Clear 02/09/2018 1352   LABSPEC 1.018 04/24/2021 0446   PHURINE 5.0 04/24/2021 0446   GLUCOSEU NEGATIVE 04/24/2021 0446   HGBUR NEGATIVE 04/24/2021 0446   BILIRUBINUR NEGATIVE 04/24/2021 0446   BILIRUBINUR Negative 02/09/2018 1352   KETONESUR NEGATIVE 04/24/2021 0446   PROTEINUR 100 (A) 04/24/2021 0446   NITRITE NEGATIVE 04/24/2021 0446   LEUKOCYTESUR NEGATIVE 04/24/2021 0446    Microbiology Recent Results (from the past 240 hour(s))  SARS CORONAVIRUS 2 (TAT 6-24 HRS) Nasopharyngeal Nasopharyngeal Swab     Status: None   Collection Time: 05/04/21  4:46 PM   Specimen: Nasopharyngeal Swab  Result Value Ref Range Status   SARS Coronavirus 2 NEGATIVE NEGATIVE Final    Comment: (NOTE) SARS-CoV-2 target nucleic acids are NOT DETECTED.  The SARS-CoV-2 RNA is generally detectable in upper and lower respiratory specimens during the acute phase of infection. Negative results do not preclude SARS-CoV-2 infection, do not rule out co-infections with other pathogens, and should not be used as the sole basis for treatment or other patient management decisions. Negative results must be combined with clinical observations, patient history, and epidemiological information. The expected result is Negative.  Fact Sheet for Patients: SugarRoll.be  Fact Sheet for Healthcare Providers: https://www.woods-mathews.com/  This test is not yet  approved or cleared by the [place] FDA and  has been authorized for detection and/or diagnosis of SARS-CoV-2 by FDA under an Emergency Use Authorization (EUA). This EUA will remain  in effect (meaning this test can be used) for the duration of the COVID-19 declaration under Se ction 564(b)(1) of the Act, 21 U.S.C. section 360bbb-3(b)(1), unless the authorization is terminated or revoked sooner.  Performed at [facility], [place] [address] [place], [place] [address]   SARS CORONAVIRUS 2 (TAT 6-24 HRS) Nasopharyngeal Nasopharyngeal Swab     Status: None   Collection Time: 05/10/21  5:11 PM   Specimen: Nasopharyngeal Swab  Result Value Ref Range Status   SARS Coronavirus 2 NEGATIVE NEGATIVE Final    Comment: (NOTE) SARS-CoV-2 target nucleic acids are NOT DETECTED.  The SARS-CoV-2 RNA is generally detectable in upper and lower respiratory specimens during the acute phase of infection. Negative results do not preclude SARS-CoV-2 infection, do not rule out co-infections with other  pathogens, and should not be used as the sole basis for treatment or other patient management decisions. Negative results must be combined with clinical observations, patient history, and epidemiological information. The expected result is Negative.  Fact Sheet for Patients: SugarRoll.be  Fact Sheet for Healthcare Providers: https://www.woods-mathews.com/  This test is not yet approved or cleared by the [place] FDA and  has been authorized for detection and/or diagnosis of SARS-CoV-2 by FDA under an Emergency Use Authorization (EUA). This EUA will remain  in effect (meaning this test can be used) for the duration of the COVID-19 declaration under Se ction 564(b)(1) of the Act, 21 U.S.C. section 360bbb-3(b)(1), unless the authorization is terminated or revoked sooner.  Performed at [facility], [place] [address], [place], [place] [address]      Time coordinating discharge: Approximately 40 minutes  Patrecia Pour, MD  [facility] 05/11/2021, 11:09 AM

## 2021-05-11 NOTE — Consult Note (Addendum)
   [facility] Inpatient Consult   05/11/2021  Todd [name] 1956-02-27 [id number]  Follow up: [facility] Medicare  Patient is to transition to [place]: To update CCM team with [place] [place] of disposition, if needed as patient post hospital needs will be met at a skilled nursing facility level of care.   [name], RN BSN [facility] Liaison  [phone] business mobile phone Toll free office [phone]  Fax number: [phone] [name]

## 2021-05-11 NOTE — Plan of Care (Signed)
  Problem: Clinical Measurements: Goal: Ability to maintain clinical measurements within normal limits will improve Outcome: Progressing Goal: Will remain free from infection Outcome: Progressing Goal: Respiratory complications will improve Outcome: Progressing Goal: Cardiovascular complication will be avoided Outcome: Progressing   Problem: Activity: Goal: Risk for activity intolerance will decrease Outcome: Progressing   Problem: Elimination: Goal: Will not experience complications related to urinary retention Outcome: Progressing   Problem: Pain Managment: Goal: General experience of comfort will improve Outcome: Progressing   Problem: Safety: Goal: Ability to remain free from injury will improve Outcome: Progressing   Problem: Skin Integrity: Goal: Risk for impaired skin integrity will decrease Outcome: Progressing

## 2021-05-11 NOTE — Progress Notes (Addendum)
Progress Note  Patient Name: Todd [name] Date of Encounter: 05/11/2021  [facility] Cardiologist: Minus Breeding, MD [facility])   Subjective   Feeling well. No chest pain, sob or palpitations.    Inpatient Medications    Scheduled Meds:  amiodarone  400 mg Oral BID   apixaban  5 mg Oral BID   atorvastatin  40 mg Oral Daily   calcium carbonate  1,250 mg Oral QHS   cholecalciferol  1,000 Units Oral Daily   feeding supplement (GLUCERNA SHAKE)  237 mL Oral TID BM   folic acid  1 mg Oral Daily   hydrALAZINE  25 mg Oral Q8H   insulin aspart  0-5 Units Subcutaneous QHS   insulin aspart  0-9 Units Subcutaneous TID WC   isosorbide mononitrate  15 mg Oral Daily   levothyroxine  100 mcg Oral QAC breakfast   metoprolol succinate  100 mg Oral Daily   multivitamin with minerals  1 tablet Oral Daily   pantoprazole  40 mg Oral BID   sertraline  50 mg Oral QHS   sodium bicarbonate  1,300 mg Oral BID   thiamine  100 mg Oral Daily   Continuous Infusions:  sodium chloride Stopped (04/20/21 0916)   PRN Meds: sodium chloride, acetaminophen, ipratropium-albuterol, nicotine   Vital Signs    Vitals:   05/10/21 0822 05/10/21 1425 05/10/21 2118 05/11/21 0445  BP: (!) 110/59 (!) 106/53 (!) 121/57 130/79  Pulse: 60 (!) 58 63 60  Resp:  16 16 19   Temp:  98.4 F (36.9 C) 98.1 F (36.7 C) 98.4 F (36.9 C)  TempSrc:  Oral Oral Oral  SpO2:  100% 100% 97%  Weight:    60.2 kg  Height:        Intake/Output Summary (Last 24 hours) at 05/11/2021 0806 Last data filed at 05/11/2021 0600 Gross per 24 hour  Intake 240 ml  Output 300 ml  Net -60 ml   Last 3 Weights 05/11/2021 05/10/2021 05/08/2021  Weight (lbs) 132 lb 11.5 oz 136 lb 0.4 oz 132 lb 11.5 oz  Weight (kg) 60.2 kg 61.7 kg 60.2 kg      Telemetry    Sinus bradycardia in 50s - Personally Reviewed  ECG    N/A  Physical Exam   GEN: No acute distress.   Neck: No JVD Cardiac: RRR, no murmurs, rubs, or gallops.   Respiratory: Clear to auscultation bilaterally. GI: Soft, nontender, non-distended  MS: s/p bilateral AKA Neuro:  Nonfocal  Psych: Normal affect   Labs    High Sensitivity Troponin:   Recent Labs  Lab 04/19/21 0019 04/20/21 0254 04/20/21 0502 04/20/21 0639  TROPONINIHS 52* 55* 55* 56*     Chemistry Recent Labs  Lab 05/05/21 0234 05/06/21 0154 05/07/21 0233 05/08/21 0246 05/09/21 0200 05/10/21 0226 05/11/21 0059  NA 139 142   < > 139 141 140 138  K 3.5 4.3   < > 3.7 3.6 3.3* 3.7  CL 112* 111   < > 107 106 108 108  CO2 19* 20*   < > 21* 22 21* 19*  GLUCOSE 97 97   < > 153* 91 123* 95  BUN 24* 24*   < > 32* 32* 32* 31*  CREATININE 2.12* 2.46*   < > 2.93* 3.12* 3.06* 2.98*  CALCIUM 6.9* 7.3*   < > 7.0* 6.8* 7.0* 7.1*  MG 1.8 1.8  --  1.8  --   --   --   ALBUMIN 1.7*  --   --   --   --  2.6*  --   GFRNONAA 34* 28*   < > 23* 21* 22* 22*  ANIONGAP 8 11   < > 11 13 11 11    < > = values in this interval not displayed.     Hematology Recent Labs  Lab 05/06/21 0154 05/10/21 0226  WBC 13.1* 11.5*  RBC 3.33* 3.32*  HGB 9.4* 9.1*  HCT 28.9* 28.5*  MCV 86.8 85.8  MCH 28.2 27.4  MCHC 32.5 31.9  RDW 14.9 14.6  PLT 519* 549*     Radiology    No results found.  Cardiac Studies   TTE 04/20/2021   1. Left ventricular ejection fraction, by estimation, is 45 to 50%. The  left ventricle has mildly decreased function. There is moderate left  ventricular hypertrophy.   2. Right ventricular systolic function is normal. The right ventricular  size is normal.   3. Left atrial size was severely dilated.   4. Right atrial size was moderately dilated.   5. The inferior vena cava is normal in size with greater than 50%  respiratory variability, suggesting right atrial pressure of 3 mmHg.   6. Limited echo evaluate LV function.   Patient Profile     65 y.o. male with hx of paroxysmal atrial fibrillation, congestive heart failure, PAD status post bilateral AKA,  hypertension, lipidemia, diabetes, polysubstance abuse who was admitted on 04/19/2021 with acute hypoxic respiratory failure secondary to pneumonia and congestive heart failure.  Course has been complicated by sustained ventricular tachycardia and AKI.  Assessment & Plan    Sustained VT - No reoccurrence on amiodarone load of 10gm then transition to 400mg  daily - Unable to do cath due to CKD - Has follow up with EP  2. Acute systolic CHF - LVEF of 76-19%. Unable to pursue invasive evaluation due to AonCKD - No ACE/ARB/ARNI given renal function - Continue BB, hydralazine and Imdur at current dose - Euvolemic on exam  3. PAF - Maintaining sinus rhythm - Continue Amiodarone and metoprolol - Continue Eliquis 5 mg BID  4. Elevated troponin  - Felt demand ischemia - On statin and BB  5. Acute on CKD IIIa - nephrology signed off yesterday with plan for outpatient follow up    [place] will sign off.   Medication Recommendations:  Amiodarone 400mg  daily (starting 11/30), Toprol XL 100mg  qd, Imdur 15mg  qd, hydralazine 25mg  TID, Lipitor 40mg  qd and Eliquis 5 mg BID Other recommendations (labs, testing, etc):  Cath when stable renal function or on dialysis  Follow up as an outpatient:  Has been arranged with EP and general cardiology   For questions or updates, please contact [place] Please consult [email or web address] for contact info under     SignedLeanor Kail, PA  05/11/2021, 8:07 AM     Personally seen and examined. Agree with APP above with the following comments: 65 yo M bilateral amputee with sustained VT and HFrEF, complicated by AKI Patient notes that he feels fine; is unhappy with the food here Exam notable for regular rhythm and clean stump sites Labs notable for creatinine 2.98 Personally reviewed relevant tests; no further VT Would recommend  - no changes to the medication rec listed above - ideally we would restart him on low dose maintenance  diuretic but given persistent worsening kidney function will defer at this time - patient has been quiescent on BB and Amiodarone; we have discussed pros and cons of LiveVest, but have deferred at this time.  Rudean Haskell, MD Cardiologist  [facility], [address] [place], [place] [address] [phone]  12:11 PM

## 2021-05-17 DIAGNOSIS — E119 Type 2 diabetes mellitus without complications: Secondary | ICD-10-CM | POA: Diagnosis not present

## 2021-05-17 NOTE — Progress Notes (Signed)
PCP:  No primary care provider on file. Primary Cardiologist: Minus Breeding, MD Electrophysiologist: [name], MD   Todd [name] is a 65 y.o. male seen today for [name], MD for post hospital follow up.    65 y.o. male with a history of presumed CAD, mild cardiomyopathy with normalization of EF on Echo in 12/2020, paroxysmal atrial fibrillation on Eliquis, PAD s/p bilateral AKA in 2018, hypertension, hyperlipidemia, type 2 diabetes mellitus, hypothyroidism, CKD stage III, polysubstance abuse (cocaine, alcohol, tobacco), and poor compliance who was admitted on 04/19/2021 with acute hypoxic respiratory failure secondary to community acquired pneumonia and CHF. EP assessment requested due to runs of sustained VT on telemetry  He presented to ED 04/19/21 from SNF with SOB found to be hypoxic. SOB started between Sunday and Monday night. He was admitted for acute respiratory failure with hypoxia secondary to CAP. BNP was elevated to 860. HS troponin mild and flat felt due to demand ischemia. Acute on chronic kidney injury noted on arrival.   Mg 1.6 --> 2.5 K 4.0 A1c 7.9% sCr 1.69 (1.89) -- previously normal  Albumin 2.4 WBC 17.5 --> 16.3 HS troponin 55 --> 56 BNP 859 COVID negative TSH 0.315, Free T4 1.12   Echocardiogram 11/8 showed LVEF 45-50%, moderate LVH, severely dilated left atrium. HK in basal-mid inferior and basal-mid anteroseptal myocardium noted on prior Echo.   Has been managed with adjustment of his GDMT for A/C CHF and on ABX for his CAP by primary team.   Overnight he was noted to have multiple episodes of sustained VT as well as PVCs. EP consulted for the same  Since discharge from hospital the patient reports doing very well.  he denies chest pain, palpitations, dyspnea, PND, orthopnea, nausea, vomiting, dizziness, syncope, edema, weight gain, or early satiety.  Past Medical History:  Diagnosis Date   AKI (acute kidney injury) ([place])    Constipated     Diabetes mellitus without complication ([place])    Diarrhea    Elevated LFTs    Goiter    Gout    Hyperlipidemia    Hypertension    Leukocytosis    Reactive thrombocytosis    Right BKA infection ([place]) 08/2016   Right leg pain    Sepsis due to undetermined organism [facility])    Thyroid disease    Wound infection after surgery 08/2016   Past Surgical History:  Procedure Laterality Date   ABDOMINAL AORTOGRAM N/A 08/11/2016   Procedure: Abdominal Aortogram;  Surgeon: Waynetta Sandy, MD;  Location: [facility];  Service: Cardiovascular;  Laterality: N/A;   ABDOMINAL AORTOGRAM W/LOWER EXTREMITY N/A 08/15/2016   Procedure: Abdominal Aortogram w/Lower Extremity;  Surgeon: Elam Dutch, MD;  Location: [facility];  Service: Cardiovascular;  Laterality: N/A;   AMPUTATION Right 08/17/2016   Procedure: RIGHT BELOW KNEE AMPUTATION;  Surgeon: Elam Dutch, MD;  Location: [place];  Service: Vascular;  Laterality: Right;   AMPUTATION Right 09/12/2016   Procedure: AMPUTATION ABOVE KNEE;  Surgeon: Newt Minion, MD;  Location: [facility] [facility];  Service: Orthopedics;  Laterality: Right;   AMPUTATION Left 08/12/2016   Procedure: LEFT BELOW KNEE AMPUTATION;  Surgeon: Newt Minion, MD;  Location: [facility];  Service: Orthopedics;  Laterality: Left;   AMPUTATION Left 11/01/2017   Procedure: LEFT ABOVE KNEE AMPUTATION;  Surgeon: Newt Minion, MD;  Location: [place];  Service: Orthopedics;  Laterality: Left;   APPLICATION OF WOUND VAC Right 09/12/2016   Procedure:  APPLICATION OF WOUND VAC ABOVE KNEE;  Surgeon: Newt Minion, MD;  Location: [facility] [place];  Service: Orthopedics;  Laterality: Right;   BIOPSY  01/13/2021   Procedure: BIOPSY;  Surgeon: Rogene Houston, MD;  [facility];  Service: Endoscopy;;  esophageal   COLONOSCOPY WITH PROPOFOL N/A 01/13/2021   Procedure: COLONOSCOPY WITH PROPOFOL;  Surgeon: Rogene Houston, MD;  [facility];  Service: Endoscopy;  Laterality: N/A;    ESOPHAGOGASTRODUODENOSCOPY (EGD) WITH PROPOFOL N/A 01/13/2021   Procedure: ESOPHAGOGASTRODUODENOSCOPY (EGD) WITH PROPOFOL;  Surgeon: Rogene Houston, MD;  [facility];  Service: Endoscopy;  Laterality: N/A;   LOWER EXTREMITY ANGIOGRAPHY Bilateral 08/11/2016   Procedure: Lower Extremity Angiography;  Surgeon: Waynetta Sandy, MD;  Location: [facility];  Service: Cardiovascular;  Laterality: Bilateral;   PERIPHERAL VASCULAR BALLOON ANGIOPLASTY Left 08/11/2016   Procedure: Peripheral Vascular Balloon Angioplasty;  Surgeon: Waynetta Sandy, MD;  Location: [facility];  Service: Cardiovascular;  Laterality: Left;  SFA   POLYPECTOMY  01/13/2021   Procedure: POLYPECTOMY;  Surgeon: Rogene Houston, MD;  [facility];  Service: Endoscopy;;   THYROID SURGERY      Current Outpatient Medications  Medication Sig Dispense Refill   acetaminophen (TYLENOL) 325 MG tablet Take 2 tablets (650 mg total) by mouth every 6 (six) hours as needed for mild pain (or Fever >/= 101).     allopurinol (ZYLOPRIM) 100 MG tablet Take 1 tablet (100 mg total) by mouth daily. (Patient taking differently: Take 300 mg by mouth daily.) 30 tablet 1   amiodarone (PACERONE) 400 MG tablet Take 1 tablet (400 mg total) by mouth daily.     apixaban (ELIQUIS) 5 MG TABS tablet Take 1 tablet (5 mg total) by mouth 2 (two) times daily. 60 tablet 5   atorvastatin (LIPITOR) 40 MG tablet TAKE 1 TABLET BY MOUTH ONCE DAILY AT 6 PM 30 tablet 1   calcium carbonate (TUMS - DOSED IN MG ELEMENTAL CALCIUM) 500 MG chewable tablet Chew 2 tablets (400 mg of elemental calcium total) by mouth 2 (two) times daily with a meal. (Patient taking differently: Chew 2 tablets by mouth 3 (three) times daily with meals.) 60 tablet 4   cholecalciferol (VITAMIN D3) 25 MCG (1000 UNIT) tablet Take 1,000 Units by mouth daily.     folic acid (FOLVITE) 1 MG tablet Take 1 tablet (1 mg total) by mouth daily. 30 tablet 5   glucose  blood (ACCU-CHEK AVIVA PLUS) test strip Check blood sugars four times a day 400 each 3   insulin lispro (HUMALOG) 100 UNIT/ML KwikPen Inject 2-10 Units into the skin. Sliding scale 200-250 2units, 251-300 4units, 301-350 6units, 351-400 8units,401-450 10units     isosorbide mononitrate (IMDUR) 30 MG 24 hr tablet Take 0.5 tablets (15 mg total) by mouth daily.     levothyroxine (SYNTHROID) 100 MCG tablet Take 1 tablet (100 mcg total) by mouth daily before breakfast.     LORazepam (ATIVAN) 0.5 MG tablet Take 1 tablet (0.5 mg total) by mouth every 6 (six) hours as needed. 12 tablet 0   megestrol (MEGACE) 400 MG/10ML suspension Take 10 mLs (400 mg total) by mouth 2 (two) times daily. For appetite stimulation 500 mL 1   metoprolol succinate (TOPROL-XL) 100 MG 24 hr tablet Take 1 tablet (100 mg total) by mouth daily. Take with or immediately following a meal.     Multiple Vitamin (MULTIVITAMIN WITH MINERALS) TABS tablet Take 1 tablet  by mouth daily.     Nutritional Supplements (NUTRITIONAL SHAKE HIGH PROTEIN) LIQD Take 30 mLs by mouth daily.     Nystatin (GERHARDT'S BUTT CREAM) CREA Apply 1 application topically 3 (three) times daily.     pantoprazole (PROTONIX) 40 MG tablet Take 1 tablet (40 mg total) by mouth 2 (two) times daily.     polyethylene glycol (MIRALAX / GLYCOLAX) 17 g packet Take 17 g by mouth daily. 14 each 0   sertraline (ZOLOFT) 50 MG tablet Take 1 tablet (50 mg total) by mouth at bedtime. 30 tablet 1   silver sulfADIAZINE (SILVADENE) 1 % cream Apply 1 application topically daily. Apply to buttock     sodium bicarbonate 650 MG tablet Take 650 mg by mouth 3 (three) times daily.     thiamine 100 MG tablet Take 1 tablet (100 mg total) by mouth daily. 30 tablet 3   cetirizine (ZYRTEC) 10 MG tablet Take 1 tablet (10 mg total) by mouth daily as needed for allergies. For allergy symptoms (Patient not taking: Reported on 05/18/2021) 90 tablet 1   hydrALAZINE (APRESOLINE) 25 MG tablet Take 1 tablet  (25 mg total) by mouth 3 (three) times daily. (Patient not taking: Reported on 05/18/2021)     No current facility-administered medications for this visit.    Allergies  Allergen Reactions   Lisinopril Other (See Comments)    Hyperkalemia / Renal failure    Social History   Socioeconomic History   Marital status: Single    Spouse name: Not on file   Number of children: Not on file   Years of education: Not on file   Highest education level: Not on file  Occupational History   Occupation: retired    Comment: drove a Medical illustrator  Tobacco Use   Smoking status: Every Day    Packs/day: 0.25    Years: 45.00    Pack years: 11.25    Types: Cigarettes    Last attempt to quit: 11/12/2014    Years since quitting: 6.5   Smokeless tobacco: Never  Vaping Use   Vaping Use: Never used  Substance and Sexual Activity   Alcohol use: Yes    Alcohol/week: 2.0 standard drinks    Types: 1 Glasses of wine, 1 Cans of beer per week   Drug use: No   Sexual activity: Not Currently  Other Topics Concern   Not on file  Social History Narrative   09/23/20 - Lives alone, uses a wheelchair, double amputee, not married, no children - HHA comes in 3x per week to help him bathe and set up weekly meds.   Social Determinants of Health   Financial Resource Strain: Low Risk    Difficulty of Paying Living Expenses: Not very hard  Food Insecurity: No Food Insecurity   Worried About Charity fundraiser in the Last Year: Never true   Ran Out of Food in the Last Year: Never true  Transportation Needs: No Transportation Needs   Lack of Transportation (Medical): No   Lack of Transportation (Non-Medical): No  Physical Activity: Inactive   Days of Exercise per Week: 0 days   Minutes of Exercise per Session: 0 min  Stress: No Stress Concern Present   Feeling of Stress : Only a little  Social Connections: Socially Isolated   Frequency of Communication with Friends and Family: More than three times a week    Frequency of Social Gatherings with Friends and Family: More than three times a week   Attends  Religious Services: Never   Active Member of Clubs or Organizations: No   Attends Archivist Meetings: Never   Marital Status: Never married  Human resources officer Violence: Not At Risk   Fear of Current or Ex-Partner: No   Emotionally Abused: No   Physically Abused: No   Sexually Abused: No     Review of Systems: All other systems reviewed and are otherwise negative except as noted above.  Physical Exam: Vitals:   05/18/21 1024  BP: (!) 118/50  Pulse: 60  SpO2: 98%  Height: 5\' 8"  (1.727 m)    GEN- The patient is well appearing, alert and oriented x 3 today.   HEENT: normocephalic, atraumatic; sclera clear, conjunctiva pink; hearing intact; oropharynx clear; neck supple, no JVP Lymph- no cervical lymphadenopathy Lungs- Clear to ausculation bilaterally, normal work of breathing.  No wheezes, rales, rhonchi Heart- Regular rate and rhythm, no murmurs, rubs or gallops, PMI not laterally displaced GI- soft, non-tender, non-distended, bowel sounds present, no hepatosplenomegaly Extremities- no clubbing, cyanosis, or edema; DP/PT/radial pulses 2+ bilaterally MS- no significant deformity or atrophy Skin- warm and dry, no rash or lesion Psych- euthymic mood, full affect Neuro- strength and sensation are intact  EKG is ordered. Personal review of EKG from today shows NSR at 60 bpm, normal intervals  Additional studies reviewed include: Previous EP and gen cards notes.   Assessment and Plan:  1.  Sustained VT K 4.2 this am, Mg 2.2 EF 45-50% by echo this admission Complicated by acute illness Potential ischemic component. Perhaps as Cr improves could have interventional team due a low contrast cath. Sees gen cards next week.  cMRI would be helpful to evaluate his substrate for VT, pending renal function today. Discussed with Dr. Aundra Dubin. Would have to get permission directly from  Radiology to perform cMRI at Cr 3.0, potentially even as it trends back towards 2.0. Will plan accordingly based on labs today.  With acute issues and recent substance abuse (Cocaine + July, no UDS this admit) he is a poor candidate for EP procedures at this time.  Nephrology consultation would also be helpful in assessing likelihood of renal improvement.  Continue amiodarone 400 mg daily x 1 more week, then decrease to 200 mg daily.    2. Paroxysmal atrial fibrillation EKG today shows NSR   3. H/o syncope In 12/2020 in setting of polysubstance abuse at that time. K 3.5 that admission  As above, currently poor candidate for EP procedures.  4. AKI on CKD III Labs today.  cMRI is still limited in renal patients despite the relative safety of our current Gadolinium. Will clarify restrictions with MRI team.   Follow up with Dr. [name] as scheduled.  Shirley Friar, PA-C  05/18/21 10:38 AM

## 2021-05-18 ENCOUNTER — Ambulatory Visit (INDEPENDENT_AMBULATORY_CARE_PROVIDER_SITE_OTHER): Payer: Medicare HMO | Admitting: Student

## 2021-05-18 ENCOUNTER — Other Ambulatory Visit: Payer: Self-pay

## 2021-05-18 VITALS — BP 118/50 | HR 60 | Ht 68.0 in

## 2021-05-18 DIAGNOSIS — Z9114 Patient's other noncompliance with medication regimen: Secondary | ICD-10-CM | POA: Diagnosis not present

## 2021-05-18 DIAGNOSIS — R55 Syncope and collapse: Secondary | ICD-10-CM

## 2021-05-18 DIAGNOSIS — F1910 Other psychoactive substance abuse, uncomplicated: Secondary | ICD-10-CM | POA: Diagnosis not present

## 2021-05-18 DIAGNOSIS — I472 Ventricular tachycardia, unspecified: Secondary | ICD-10-CM

## 2021-05-18 DIAGNOSIS — I480 Paroxysmal atrial fibrillation: Secondary | ICD-10-CM

## 2021-05-18 LAB — COMPREHENSIVE METABOLIC PANEL
ALT: 14 IU/L (ref 0–44)
AST: 19 IU/L (ref 0–40)
Albumin/Globulin Ratio: 1 — ABNORMAL LOW (ref 1.2–2.2)
Albumin: 3.3 g/dL — ABNORMAL LOW (ref 3.8–4.8)
Alkaline Phosphatase: 128 IU/L — ABNORMAL HIGH (ref 44–121)
BUN/Creatinine Ratio: 13 (ref 10–24)
BUN: 26 mg/dL (ref 8–27)
Bilirubin Total: 0.3 mg/dL (ref 0.0–1.2)
CO2: 17 mmol/L — ABNORMAL LOW (ref 20–29)
Calcium: 8.5 mg/dL — ABNORMAL LOW (ref 8.6–10.2)
Chloride: 111 mmol/L — ABNORMAL HIGH (ref 96–106)
Creatinine, Ser: 2.06 mg/dL — ABNORMAL HIGH (ref 0.76–1.27)
Globulin, Total: 3.3 g/dL (ref 1.5–4.5)
Glucose: 113 mg/dL — ABNORMAL HIGH (ref 70–99)
Potassium: 4.2 mmol/L (ref 3.5–5.2)
Sodium: 140 mmol/L (ref 134–144)
Total Protein: 6.6 g/dL (ref 6.0–8.5)
eGFR: 35 mL/min/{1.73_m2} — ABNORMAL LOW (ref >59)

## 2021-05-18 LAB — CBC
Hematocrit: 27.5 % — ABNORMAL LOW (ref 37.5–51.0)
Hemoglobin: 9.1 g/dL — ABNORMAL LOW (ref 13.0–17.7)
MCH: 27.9 pg (ref 26.6–33.0)
MCHC: 33.1 g/dL (ref 31.5–35.7)
MCV: 84 fL (ref 79–97)
Platelets: 706 10*3/uL — ABNORMAL HIGH (ref 150–450)
RBC: 3.26 x10E6/uL — ABNORMAL LOW (ref 4.14–5.80)
RDW: 13.5 % (ref 11.6–15.4)
WBC: 14.9 10*3/uL — ABNORMAL HIGH (ref 3.4–10.8)

## 2021-05-18 LAB — TSH: TSH: 5.55 u[IU]/mL — ABNORMAL HIGH (ref 0.450–4.500)

## 2021-05-18 NOTE — Patient Instructions (Signed)
Medication Instructions:  Your physician has recommended you make the following change in your medication:   DECREASE: Amiodarone to 200mg  daily in 1 week.  *If you need a refill on your cardiac medications before your next appointment, please call your pharmacy*   Lab Work: TODAY: CMET, CBC, TSH  If you have labs (blood work) drawn today and your tests are completely normal, you will receive your results only by: [place] (if you have MyChart) OR A paper copy in the mail If you have any lab test that is abnormal or we need to change your treatment, we will call you to review the results.   Follow-Up: At [facility], you and your health needs are our priority.  As part of our continuing mission to provide you with exceptional heart care, we have created designated Provider Care Teams.  These Care Teams include your primary Cardiologist (physician) and Advanced Practice Providers (APPs -  Physician Assistants and Nurse Practitioners) who all work together to provide you with the care you need, when you need it.  We recommend signing up for the patient portal called "MyChart".  Sign up information is provided on this After Visit Summary.  MyChart is used to connect with patients for Virtual Visits (Telemedicine).  Patients are able to view lab/test results, encounter notes, upcoming appointments, etc.  Non-urgent messages can be sent to your provider as well.   To learn more about what you can do with MyChart, go to [email or web address].    Your next appointment:   As scheduled  Other Instructions   You are scheduled for Cardiac MRI on ______________. Please arrive at the [facility] main entrance of [facility] at ________________ (30-45 minutes prior to test start time). ?  [facility] [address] [place], [place] [address] [phone]  Please take advantage of the free valet parking available at the MAIN entrance (A entrance). Proceed to  the [facility] Radiology Department (First Floor). ? Magnetic resonance imaging (MRI) is a painless test that produces images of the inside of the body without using Xrays.  During an MRI, strong magnets and radio waves work together in a Research officer, political party to form detailed images.   MRI images may provide more details about a medical condition than X-rays, CT scans, and ultrasounds can provide.  You may be given earphones to listen for instructions.  You may eat a light breakfast and take medications as ordered with the exception of HCTZ (fluid pill, other). Please avoid stimulants for 12 hr prior to test. (Ie. Caffeine, nicotine, chocolate, or antihistamine medications)  If a contrast material will be used, an IV will be inserted into one of your veins. Contrast material will be injected into your IV. It will leave your body through your urine within a day. You may be told to drink plenty of fluids to help flush the contrast material out of your system.  You will be asked to remove all metal, including: Watch, jewelry, and other metal objects including hearing aids, hair pieces and dentures. Also wearable glucose monitoring systems (ie. Freestyle Libre and Omnipods) (Braces and fillings normally are not a problem.)   TEST WILL TAKE APPROXIMATELY 1 HOUR  PLEASE NOTIFY SCHEDULING AT LEAST 24 HOURS IN ADVANCE IF YOU ARE UNABLE TO KEEP YOUR APPOINTMENT. [phone]  Please call Marchia Bond, cardiac imaging nurse navigator with any questions/concerns. Marchia Bond RN Navigator Cardiac Imaging Gordy Clement RN Navigator Cardiac Imaging [facility] [phone]  Office

## 2021-05-21 DIAGNOSIS — E559 Vitamin D deficiency, unspecified: Secondary | ICD-10-CM | POA: Diagnosis not present

## 2021-05-21 DIAGNOSIS — E119 Type 2 diabetes mellitus without complications: Secondary | ICD-10-CM | POA: Diagnosis not present

## 2021-05-21 NOTE — Progress Notes (Addendum)
Cardiology Office Note    Patient ID: Todd [name]; [id number]; [birth year]   Admit date: (Not on file) Date of Consult: 05/24/2021  Primary Care Provider: Pcp, No Primary Cardiologist: Dr. Minus Breeding  CC: Hospital Follow Up  Patient Profile:   Todd [name] is a 65 y.o. male with a hx of paroxysmal atrial fibrillation, congestive heart failure, PAD, status post bilateral AKA, hypertension, dyslipidemia, diabetes, polysubstance abuse who was admitted in November 2022 with acute hypoxic respiratory failure secondary to pneumonia and CHF.  The patient also had complication of sustained ventricular tachycardia and acute kidney injury.  The patient was consulted during hospitalization by cardiology.    History of Present Illness:   Todd [name] is here for posthospital follow-up.  On discharge he was placed on p.o. amiodarone 400 mg daily.  Cardiac catheterization was not completed due to chronic kidney disease.  He was to follow-up with EP for management of atrial fib.  He was treated for acute systolic CHF (echo revealing EF of 45% to 50%).  No ACE or ARB or ARNI, was given because of renal failure, therefore he was continued on beta-blocker, hydralazine, Imdur.  He was to follow-up with nephrology as an outpatient.  He comes today with physical rehab facility driver.  He is without any complaints of chest discomfort, he does have some shortness of breath with physical rehab, he denies any abdominal pain, edema, or nausea or vomiting.  Labs are ordered via the physical rehab facility.  He denies any rapid heart rhythm or palpitations or associated near syncope.  Past Medical History:  Diagnosis Date   AKI (acute kidney injury) ([place])    Constipated    Diabetes mellitus without complication ([place])    Diarrhea    Elevated LFTs    Goiter    Gout    Hyperlipidemia    Hypertension    Leukocytosis    Reactive thrombocytosis    Right BKA infection ([place]) 08/2016   Right leg pain     Sepsis due to undetermined organism [facility])    Thyroid disease    Wound infection after surgery 08/2016    Past Surgical History:  Procedure Laterality Date   ABDOMINAL AORTOGRAM N/A 08/11/2016   Procedure: Abdominal Aortogram;  Surgeon: Waynetta Sandy, MD;  Location: [place] [facility];  Service: Cardiovascular;  Laterality: N/A;   ABDOMINAL AORTOGRAM W/LOWER EXTREMITY N/A 08/15/2016   Procedure: Abdominal Aortogram w/Lower Extremity;  Surgeon: Elam Dutch, MD;  Location: [place] [facility];  Service: Cardiovascular;  Laterality: N/A;   AMPUTATION Right 08/17/2016   Procedure: RIGHT BELOW KNEE AMPUTATION;  Surgeon: Elam Dutch, MD;  Location: [place];  Service: Vascular;  Laterality: Right;   AMPUTATION Right 09/12/2016   Procedure: AMPUTATION ABOVE KNEE;  Surgeon: Newt Minion, MD;  Location: [place];  Service: Orthopedics;  Laterality: Right;   AMPUTATION Left 08/12/2016   Procedure: LEFT BELOW KNEE AMPUTATION;  Surgeon: Newt Minion, MD;  Location: [place];  Service: Orthopedics;  Laterality: Left;   AMPUTATION Left 11/01/2017   Procedure: LEFT ABOVE KNEE AMPUTATION;  Surgeon: Newt Minion, MD;  Location: [place];  Service: Orthopedics;  Laterality: Left;   APPLICATION OF WOUND VAC Right 09/12/2016   Procedure: APPLICATION OF WOUND VAC ABOVE KNEE;  Surgeon: Newt Minion, MD;  Location: [place];  Service: Orthopedics;  Laterality: Right;   BIOPSY  01/13/2021   Procedure: BIOPSY;  Surgeon: Rogene Houston, MD;  [facility];  Service: Endoscopy;;  esophageal   COLONOSCOPY WITH PROPOFOL N/A 01/13/2021   Procedure: COLONOSCOPY WITH PROPOFOL;  Surgeon: Rogene Houston, MD;  [facility];  Service: Endoscopy;  Laterality: N/A;   ESOPHAGOGASTRODUODENOSCOPY (EGD) WITH PROPOFOL N/A 01/13/2021   Procedure: ESOPHAGOGASTRODUODENOSCOPY (EGD) WITH PROPOFOL;  Surgeon: Rogene Houston, MD;  [facility];  Service: Endoscopy;  Laterality: N/A;   LOWER EXTREMITY  ANGIOGRAPHY Bilateral 08/11/2016   Procedure: Lower Extremity Angiography;  Surgeon: Waynetta Sandy, MD;  Location: [place] [facility];  Service: Cardiovascular;  Laterality: Bilateral;   PERIPHERAL VASCULAR BALLOON ANGIOPLASTY Left 08/11/2016   Procedure: Peripheral Vascular Balloon Angioplasty;  Surgeon: Waynetta Sandy, MD;  Location: [place] [facility];  Service: Cardiovascular;  Laterality: Left;  SFA   POLYPECTOMY  01/13/2021   Procedure: POLYPECTOMY;  Surgeon: Rogene Houston, MD;  [facility];  Service: Endoscopy;;   THYROID SURGERY          Allergies:    Allergies  Allergen Reactions   Lisinopril Other (See Comments)    Hyperkalemia / Renal failure    Social History:   Social History   Socioeconomic History   Marital status: Single    Spouse name: Not on file   Number of children: Not on file   Years of education: Not on file   Highest education level: Not on file  Occupational History   Occupation: retired    Comment: drove a Medical illustrator  Tobacco Use   Smoking status: Every Day    Packs/day: 0.25    Years: 45.00    Pack years: 11.25    Types: Cigarettes    Last attempt to quit: 11/12/2014    Years since quitting: 6.5   Smokeless tobacco: Never  Vaping Use   Vaping Use: Never used  Substance and Sexual Activity   Alcohol use: Yes    Alcohol/week: 2.0 standard drinks    Types: 1 Glasses of wine, 1 Cans of beer per week   Drug use: No   Sexual activity: Not Currently  Other Topics Concern   Not on file  Social History Narrative   09/23/20 - Lives alone, uses a wheelchair, double amputee, not married, no children - HHA comes in 3x per week to help him bathe and set up weekly meds.   Social Determinants of Health   Financial Resource Strain: Low Risk    Difficulty of Paying Living Expenses: Not very hard  Food Insecurity: No Food Insecurity   Worried About Charity fundraiser in the Last Year: Never true   Ran Out of Food in the  Last Year: Never true  Transportation Needs: No Transportation Needs   Lack of Transportation (Medical): No   Lack of Transportation (Non-Medical): No  Physical Activity: Inactive   Days of Exercise per Week: 0 days   Minutes of Exercise per Session: 0 min  Stress: No Stress Concern Present   Feeling of Stress : Only a little  Social Connections: Socially Isolated   Frequency of Communication with Friends and Family: More than three times a week   Frequency of Social Gatherings with Friends and Family: More than three times a week   Attends Religious Services: Never   Marine scientist or Organizations: No   Attends Archivist Meetings: Never   Marital Status: Never married  Human resources officer Violence: Not At Risk   Fear of Current or Ex-Partner: No   Emotionally Abused: No  Physically Abused: No   Sexually Abused: No    Family History:    Family History  Problem Relation Age of Onset   Heart disease Mother    Pneumonia Father    Diabetes Maternal Aunt    Diabetes Maternal Uncle      ROS:   All other ROS reviewed and negative.     Physical Exam/Data:   Vitals:   05/24/21 0847  BP: 122/60  Pulse: 68  SpO2: 97%  Weight: 130 lb (59 kg)  Height: 5\' 8"  (1.727 m)   Filed Weights   05/24/21 0847  Weight: 130 lb (59 kg)   Body mass index is 19.77 kg/m.  General:  Well nourished, well developed, in no acute distress HEENT: normal Lymph: no adenopathy Neck: no JVD Endocrine:  No thryomegaly Vascular: No carotid bruits; FA pulses 2+ bilaterally without bruits  Cardiac:  normal S1, S2; RRR; no murmur  Lungs: Right lower lung crackles, no wheezes or rhonchi, otherwise clear  Abd: soft, nontender, no hepatomegaly, obese Ext: no edema Musculoskeletal:  No deformities, BUE and BLE strength normal and equal, bilateral AKA. Skin: warm and dry  Neuro:  CNs 2-12 intact, no focal abnormalities noted Psych:  Normal affect   EKG:  The EKG was personally  reviewed and demonstrates: Normal sinus rhythm, with nonspecific ST-T wave abnormality, QTC 480 ms, QTC 452 ms.  Relevant CV Studies: TTE 04/20/2021   1. Left ventricular ejection fraction, by estimation, is 45 to 50%. The  left ventricle has mildly decreased function. There is moderate left  ventricular hypertrophy.   2. Right ventricular systolic function is normal. The right ventricular  size is normal.   3. Left atrial size was severely dilated.   4. Right atrial size was moderately dilated.   5. The inferior vena cava is normal in size with greater than 50%  respiratory variability, suggesting right atrial pressure   Laboratory Data:  Chemistry Recent Labs  Lab 05/18/21 1110  NA 140  K 4.2  CL 111*  CO2 17*  GLUCOSE 113*  BUN 26  CREATININE 2.06*  CALCIUM 8.5*    Recent Labs  Lab 05/18/21 1110  PROT 6.6  ALBUMIN 3.3*  AST 19  ALT 14  ALKPHOS 128*  BILITOT 0.3   Hematology Recent Labs  Lab 05/18/21 1110  WBC 14.9*  RBC 3.26*  HGB 9.1*  HCT 27.5*  MCV 84  MCH 27.9  MCHC 33.1  RDW 13.5  PLT 706*    Assessment and Plan:   Ventricular tachycardia: The patient was found to have V. tach during hospitalization in the setting of cocaine.  He is on amiodarone 400 mg daily.  I have reviewed his labs he does have an elevated TSH, 5.55.  I going to decrease his amiodarone to 200 mg daily in the setting of thyroid disease.  He does have labs drawn for this facility approximately every 2 weeks.  Recommend a follow-up TSH, CBC, and BMET.  These are already planned to be ordered. Plan to stop amiodarone as he has not had symptoms. Will discuss with Dr.Hochrein.   2.  Atrial fib with RVR: Currently in normal sinus rhythm.  He remains on apixaban 5 mg twice daily.  Changing dose of amiodarone to 200 mg daily.  3.  Hypertension: Remains on hydralazine, isosorbide mononitrate, BMET to be ordered by facility.  Most recent has been reviewed.  4. AKI : No ACE, ARN I, or  ARB.  Remains on hydralazine and  isosorbide.  Blood pressures currently well controlled.  No changes.  5.  Hypercholesterolemia: He is on atorvastatin 40 mg daily.  Fasting lipids and LFTs will be drawn by PCP or facility if not completed by next office visit we will draw.  Goal of LDL less than 70.   For questions or updates, please contact [facility] Please consult [email or web address] for contact info under Cardiology/STEMI.   Signed, Phill Myron. [place] Pugh, ANP, AACC  05/24/2021 10:39 AM

## 2021-05-24 ENCOUNTER — Encounter: Payer: Self-pay | Admitting: Adult Health

## 2021-05-24 ENCOUNTER — Ambulatory Visit (INDEPENDENT_AMBULATORY_CARE_PROVIDER_SITE_OTHER): Payer: Medicare HMO | Admitting: Adult Health

## 2021-05-24 ENCOUNTER — Other Ambulatory Visit: Payer: Self-pay

## 2021-05-24 VITALS — BP 122/60 | HR 68 | Ht 68.0 in | Wt 130.0 lb

## 2021-05-24 DIAGNOSIS — E7800 Pure hypercholesterolemia, unspecified: Secondary | ICD-10-CM | POA: Diagnosis not present

## 2021-05-24 DIAGNOSIS — I472 Ventricular tachycardia, unspecified: Secondary | ICD-10-CM | POA: Diagnosis not present

## 2021-05-24 DIAGNOSIS — Z720 Tobacco use: Secondary | ICD-10-CM

## 2021-05-24 DIAGNOSIS — I10 Essential (primary) hypertension: Secondary | ICD-10-CM | POA: Diagnosis not present

## 2021-05-24 DIAGNOSIS — I4891 Unspecified atrial fibrillation: Secondary | ICD-10-CM | POA: Diagnosis not present

## 2021-05-24 DIAGNOSIS — N179 Acute kidney failure, unspecified: Secondary | ICD-10-CM | POA: Diagnosis not present

## 2021-05-24 MED ORDER — AMIODARONE HCL 200 MG PO TABS: 200.0000 mg | ORAL_TABLET | Freq: Every day | ORAL | 3 refills | Status: DC

## 2021-05-24 NOTE — Patient Instructions (Signed)
Medication Instructions:  Decrease Amiodarone 400 mg Daily to 200 mg Daily. *If you need a refill on your cardiac medications before your next appointment, please call your pharmacy*   Lab Work: No Labs If you have labs (blood work) drawn today and your tests are completely normal, you will receive your results only by: [place] (if you have MyChart) OR A paper copy in the mail If you have any lab test that is abnormal or we need to change your treatment, we will call you to review the results.   Testing/Procedures: No Testing   Follow-Up: At [facility], you and your health needs are our priority.  As part of our continuing mission to provide you with exceptional heart care, we have created designated Provider Care Teams.  These Care Teams include your primary Cardiologist (physician) and Advanced Practice Providers (APPs -  Physician Assistants and Nurse Practitioners) who all work together to provide you with the care you need, when you need it.  We recommend signing up for the patient portal called "MyChart".  Sign up information is provided on this After Visit Summary.  MyChart is used to connect with patients for Virtual Visits (Telemedicine).  Patients are able to view lab/test results, encounter notes, upcoming appointments, etc.  Non-urgent messages can be sent to your provider as well.   To learn more about what you can do with MyChart, go to [email or web address].    Your next appointment:   3 day(s)  The format for your next appointment:   In Person  Provider:   Minus Breeding, MD

## 2021-05-25 ENCOUNTER — Other Ambulatory Visit: Payer: Self-pay

## 2021-05-27 ENCOUNTER — Ambulatory Visit: Payer: Medicare HMO | Admitting: Nurse Practitioner

## 2021-05-27 ENCOUNTER — Inpatient Hospital Stay (HOSPITAL_COMMUNITY)
Admission: EM | Admit: 2021-05-27 | Discharge: 2021-05-29 | DRG: 291 | Disposition: A | Discharge status: Skilled Nursing Facility | Payer: Medicare HMO | Source: Skilled Nursing Facility | Attending: Internal Medicine | Admitting: Internal Medicine

## 2021-05-27 ENCOUNTER — Encounter (HOSPITAL_COMMUNITY): Payer: Self-pay | Admitting: Emergency Medicine

## 2021-05-27 ENCOUNTER — Emergency Department (HOSPITAL_COMMUNITY): Payer: Medicare HMO

## 2021-05-27 ENCOUNTER — Other Ambulatory Visit: Payer: Self-pay

## 2021-05-27 DIAGNOSIS — D72829 Elevated white blood cell count, unspecified: Secondary | ICD-10-CM | POA: Diagnosis present

## 2021-05-27 DIAGNOSIS — I517 Cardiomegaly: Secondary | ICD-10-CM | POA: Diagnosis not present

## 2021-05-27 DIAGNOSIS — Z89612 Acquired absence of left leg above knee: Secondary | ICD-10-CM | POA: Diagnosis not present

## 2021-05-27 DIAGNOSIS — D638 Anemia in other chronic diseases classified elsewhere: Secondary | ICD-10-CM | POA: Diagnosis present

## 2021-05-27 DIAGNOSIS — Z20822 Contact with and (suspected) exposure to covid-19: Secondary | ICD-10-CM | POA: Diagnosis not present

## 2021-05-27 DIAGNOSIS — I5043 Acute on chronic combined systolic (congestive) and diastolic (congestive) heart failure: Secondary | ICD-10-CM | POA: Diagnosis present

## 2021-05-27 DIAGNOSIS — E119 Type 2 diabetes mellitus without complications: Secondary | ICD-10-CM | POA: Diagnosis present

## 2021-05-27 DIAGNOSIS — R918 Other nonspecific abnormal finding of lung field: Secondary | ICD-10-CM | POA: Diagnosis not present

## 2021-05-27 DIAGNOSIS — M109 Gout, unspecified: Secondary | ICD-10-CM | POA: Diagnosis present

## 2021-05-27 DIAGNOSIS — F1410 Cocaine abuse, uncomplicated: Secondary | ICD-10-CM | POA: Diagnosis present

## 2021-05-27 DIAGNOSIS — R778 Other specified abnormalities of plasma proteins: Secondary | ICD-10-CM

## 2021-05-27 DIAGNOSIS — F17210 Nicotine dependence, cigarettes, uncomplicated: Secondary | ICD-10-CM | POA: Diagnosis present

## 2021-05-27 DIAGNOSIS — Z888 Allergy status to other drugs, medicaments and biological substances status: Secondary | ICD-10-CM | POA: Diagnosis not present

## 2021-05-27 DIAGNOSIS — R0689 Other abnormalities of breathing: Secondary | ICD-10-CM | POA: Diagnosis not present

## 2021-05-27 DIAGNOSIS — J449 Chronic obstructive pulmonary disease, unspecified: Secondary | ICD-10-CM | POA: Diagnosis present

## 2021-05-27 DIAGNOSIS — D75839 Thrombocytosis, unspecified: Secondary | ICD-10-CM | POA: Diagnosis not present

## 2021-05-27 DIAGNOSIS — K219 Gastro-esophageal reflux disease without esophagitis: Secondary | ICD-10-CM | POA: Diagnosis present

## 2021-05-27 DIAGNOSIS — E118 Type 2 diabetes mellitus with unspecified complications: Secondary | ICD-10-CM | POA: Diagnosis not present

## 2021-05-27 DIAGNOSIS — E039 Hypothyroidism, unspecified: Secondary | ICD-10-CM | POA: Diagnosis present

## 2021-05-27 DIAGNOSIS — E782 Mixed hyperlipidemia: Secondary | ICD-10-CM | POA: Diagnosis present

## 2021-05-27 DIAGNOSIS — Z794 Long term (current) use of insulin: Secondary | ICD-10-CM

## 2021-05-27 DIAGNOSIS — Z8249 Family history of ischemic heart disease and other diseases of the circulatory system: Secondary | ICD-10-CM

## 2021-05-27 DIAGNOSIS — R7989 Other specified abnormal findings of blood chemistry: Secondary | ICD-10-CM | POA: Diagnosis present

## 2021-05-27 DIAGNOSIS — N289 Disorder of kidney and ureter, unspecified: Secondary | ICD-10-CM | POA: Diagnosis not present

## 2021-05-27 DIAGNOSIS — D649 Anemia, unspecified: Secondary | ICD-10-CM | POA: Diagnosis not present

## 2021-05-27 DIAGNOSIS — I482 Chronic atrial fibrillation, unspecified: Secondary | ICD-10-CM | POA: Diagnosis not present

## 2021-05-27 DIAGNOSIS — I4891 Unspecified atrial fibrillation: Secondary | ICD-10-CM | POA: Diagnosis not present

## 2021-05-27 DIAGNOSIS — J90 Pleural effusion, not elsewhere classified: Secondary | ICD-10-CM | POA: Diagnosis not present

## 2021-05-27 DIAGNOSIS — I509 Heart failure, unspecified: Secondary | ICD-10-CM

## 2021-05-27 DIAGNOSIS — I5023 Acute on chronic systolic (congestive) heart failure: Secondary | ICD-10-CM | POA: Diagnosis not present

## 2021-05-27 DIAGNOSIS — J9601 Acute respiratory failure with hypoxia: Secondary | ICD-10-CM | POA: Diagnosis present

## 2021-05-27 DIAGNOSIS — Z89611 Acquired absence of right leg above knee: Secondary | ICD-10-CM

## 2021-05-27 DIAGNOSIS — Z833 Family history of diabetes mellitus: Secondary | ICD-10-CM

## 2021-05-27 DIAGNOSIS — Z79890 Hormone replacement therapy (postmenopausal): Secondary | ICD-10-CM

## 2021-05-27 DIAGNOSIS — R0602 Shortness of breath: Secondary | ICD-10-CM | POA: Diagnosis not present

## 2021-05-27 DIAGNOSIS — I10 Essential (primary) hypertension: Secondary | ICD-10-CM | POA: Diagnosis not present

## 2021-05-27 DIAGNOSIS — F1010 Alcohol abuse, uncomplicated: Secondary | ICD-10-CM | POA: Diagnosis present

## 2021-05-27 DIAGNOSIS — I5033 Acute on chronic diastolic (congestive) heart failure: Secondary | ICD-10-CM | POA: Diagnosis not present

## 2021-05-27 DIAGNOSIS — R062 Wheezing: Secondary | ICD-10-CM | POA: Diagnosis not present

## 2021-05-27 DIAGNOSIS — Z7901 Long term (current) use of anticoagulants: Secondary | ICD-10-CM

## 2021-05-27 DIAGNOSIS — Z79899 Other long term (current) drug therapy: Secondary | ICD-10-CM

## 2021-05-27 DIAGNOSIS — I110 Hypertensive heart disease with heart failure: Principal | ICD-10-CM | POA: Diagnosis present

## 2021-05-27 DIAGNOSIS — R0902 Hypoxemia: Secondary | ICD-10-CM | POA: Diagnosis not present

## 2021-05-27 DIAGNOSIS — Z9114 Patient's other noncompliance with medication regimen: Secondary | ICD-10-CM

## 2021-05-27 LAB — TROPONIN I (HIGH SENSITIVITY)
Troponin I (High Sensitivity): 24 ng/L — ABNORMAL HIGH (ref <18)
Troponin I (High Sensitivity): 27 ng/L — ABNORMAL HIGH (ref <18)
Troponin I (High Sensitivity): 29 ng/L — ABNORMAL HIGH (ref <18)
Troponin I (High Sensitivity): 31 ng/L — ABNORMAL HIGH (ref <18)

## 2021-05-27 LAB — CBC WITH DIFFERENTIAL/PLATELET
Abs Immature Granulocytes: 0.2 10*3/uL — ABNORMAL HIGH (ref 0.00–0.07)
Basophils Absolute: 0.1 10*3/uL (ref 0.0–0.1)
Basophils Relative: 1 %
Eosinophils Absolute: 0.2 10*3/uL (ref 0.0–0.5)
Eosinophils Relative: 2 %
HCT: 32.4 % — ABNORMAL LOW (ref 39.0–52.0)
Hemoglobin: 10 g/dL — ABNORMAL LOW (ref 13.0–17.0)
Immature Granulocytes: 1 %
Lymphocytes Relative: 19 %
Lymphs Abs: 3 10*3/uL (ref 0.7–4.0)
MCH: 27.8 pg (ref 26.0–34.0)
MCHC: 30.9 g/dL (ref 30.0–36.0)
MCV: 90 fL (ref 80.0–100.0)
Monocytes Absolute: 0.7 10*3/uL (ref 0.1–1.0)
Monocytes Relative: 5 %
Neutro Abs: 11.3 10*3/uL — ABNORMAL HIGH (ref 1.7–7.7)
Neutrophils Relative %: 72 %
Platelets: 669 10*3/uL — ABNORMAL HIGH (ref 150–400)
RBC: 3.6 MIL/uL — ABNORMAL LOW (ref 4.22–5.81)
RDW: 15.1 % (ref 11.5–15.5)
WBC: 15.5 10*3/uL — ABNORMAL HIGH (ref 4.0–10.5)
nRBC: 0 % (ref 0.0–0.2)

## 2021-05-27 LAB — CBG MONITORING, ED
Glucose-Capillary: 121 mg/dL — ABNORMAL HIGH (ref 70–99)
Glucose-Capillary: 210 mg/dL — ABNORMAL HIGH (ref 70–99)

## 2021-05-27 LAB — GLUCOSE, CAPILLARY: Glucose-Capillary: 189 mg/dL — ABNORMAL HIGH (ref 70–99)

## 2021-05-27 LAB — BASIC METABOLIC PANEL
Anion gap: 11 (ref 5–15)
BUN: 22 mg/dL (ref 8–23)
CO2: 17 mmol/L — ABNORMAL LOW (ref 22–32)
Calcium: 7.8 mg/dL — ABNORMAL LOW (ref 8.9–10.3)
Chloride: 111 mmol/L (ref 98–111)
Creatinine, Ser: 1.99 mg/dL — ABNORMAL HIGH (ref 0.61–1.24)
GFR, Estimated: 36 mL/min — ABNORMAL LOW (ref >60)
Glucose, Bld: 155 mg/dL — ABNORMAL HIGH (ref 70–99)
Potassium: 4 mmol/L (ref 3.5–5.1)
Sodium: 139 mmol/L (ref 135–145)

## 2021-05-27 LAB — RESP PANEL BY RT-PCR (FLU A&B, COVID) ARPGX2
Influenza A by PCR: NEGATIVE
Influenza B by PCR: NEGATIVE
SARS Coronavirus 2 by RT PCR: NEGATIVE

## 2021-05-27 LAB — PROCALCITONIN: Procalcitonin: 0.23 ng/mL

## 2021-05-27 LAB — MAGNESIUM: Magnesium: 1.7 mg/dL (ref 1.7–2.4)

## 2021-05-27 LAB — BRAIN NATRIURETIC PEPTIDE: B Natriuretic Peptide: 753 pg/mL — ABNORMAL HIGH (ref 0.0–100.0)

## 2021-05-27 MED ORDER — THIAMINE HCL 100 MG PO TABS
100.0000 mg | ORAL_TABLET | Freq: Every day | ORAL | Status: DC
  Administered 2021-05-27 – 2021-05-28 (×2): 100 mg via ORAL
  Filled 2021-05-27 (×3): qty 1

## 2021-05-27 MED ORDER — POLYETHYLENE GLYCOL 3350 17 G PO PACK
17.0000 g | PACK | Freq: Every day | ORAL | Status: DC
Start: 2021-05-27 — End: 2021-05-29
  Administered 2021-05-27 – 2021-05-28 (×2): 17 g via ORAL
  Filled 2021-05-27 (×3): qty 1

## 2021-05-27 MED ORDER — MAGNESIUM SULFATE 2 GM/50ML IV SOLN
2.0000 g | Freq: Once | INTRAVENOUS | Status: AC
  Administered 2021-05-27: 2 g via INTRAVENOUS
  Filled 2021-05-27: qty 50

## 2021-05-27 MED ORDER — METOPROLOL SUCCINATE ER 50 MG PO TB24
100.0000 mg | ORAL_TABLET | Freq: Every day | ORAL | Status: DC
  Administered 2021-05-27 – 2021-05-28 (×2): 100 mg via ORAL
  Filled 2021-05-27 (×3): qty 2

## 2021-05-27 MED ORDER — NITROGLYCERIN 2 % TD OINT
1.0000 [in_us] | TOPICAL_OINTMENT | Freq: Once | TRANSDERMAL | Status: AC
  Administered 2021-05-27: 1 [in_us] via TOPICAL
  Filled 2021-05-27: qty 1

## 2021-05-27 MED ORDER — INSULIN ASPART 100 UNIT/ML IJ SOLN: 0.0000 [IU] | Freq: Every day | INTRAMUSCULAR | Status: DC

## 2021-05-27 MED ORDER — FUROSEMIDE 10 MG/ML IJ SOLN
40.0000 mg | Freq: Once | INTRAMUSCULAR | Status: AC
  Administered 2021-05-27: 40 mg via INTRAVENOUS
  Filled 2021-05-27: qty 4

## 2021-05-27 MED ORDER — HYDRALAZINE HCL 25 MG PO TABS
25.0000 mg | ORAL_TABLET | Freq: Three times a day (TID) | ORAL | Status: DC
  Administered 2021-05-27 – 2021-05-28 (×5): 25 mg via ORAL
  Filled 2021-05-27 (×6): qty 1

## 2021-05-27 MED ORDER — FOLIC ACID 1 MG PO TABS
1.0000 mg | ORAL_TABLET | Freq: Every day | ORAL | Status: DC
  Administered 2021-05-27 – 2021-05-28 (×2): 1 mg via ORAL
  Filled 2021-05-27 (×3): qty 1

## 2021-05-27 MED ORDER — PANTOPRAZOLE SODIUM 40 MG PO TBEC
40.0000 mg | DELAYED_RELEASE_TABLET | Freq: Two times a day (BID) | ORAL | Status: DC
  Administered 2021-05-27 – 2021-05-28 (×4): 40 mg via ORAL
  Filled 2021-05-27 (×5): qty 1

## 2021-05-27 MED ORDER — MEGESTROL ACETATE 400 MG/10ML PO SUSP
400.0000 mg | Freq: Two times a day (BID) | ORAL | Status: DC
  Administered 2021-05-27 – 2021-05-28 (×4): 400 mg via ORAL
  Filled 2021-05-27 (×5): qty 10

## 2021-05-27 MED ORDER — ATORVASTATIN CALCIUM 40 MG PO TABS
40.0000 mg | ORAL_TABLET | Freq: Every day | ORAL | Status: DC
  Administered 2021-05-27 – 2021-05-28 (×2): 40 mg via ORAL
  Filled 2021-05-27 (×3): qty 1

## 2021-05-27 MED ORDER — APIXABAN 2.5 MG PO TABS
2.5000 mg | ORAL_TABLET | Freq: Two times a day (BID) | ORAL | Status: DC
  Administered 2021-05-27 – 2021-05-28 (×3): 2.5 mg via ORAL
  Filled 2021-05-27 (×3): qty 1

## 2021-05-27 MED ORDER — SERTRALINE HCL 50 MG PO TABS
50.0000 mg | ORAL_TABLET | Freq: Every day | ORAL | Status: DC
  Administered 2021-05-27 – 2021-05-28 (×2): 50 mg via ORAL
  Filled 2021-05-27 (×2): qty 1

## 2021-05-27 MED ORDER — ISOSORBIDE MONONITRATE ER 60 MG PO TB24
30.0000 mg | ORAL_TABLET | Freq: Every day | ORAL | Status: DC
  Administered 2021-05-27 – 2021-05-28 (×2): 30 mg via ORAL
  Filled 2021-05-27 (×3): qty 1

## 2021-05-27 MED ORDER — FUROSEMIDE 10 MG/ML IJ SOLN
40.0000 mg | Freq: Two times a day (BID) | INTRAMUSCULAR | Status: DC
  Administered 2021-05-27 – 2021-05-28 (×2): 40 mg via INTRAVENOUS
  Filled 2021-05-27 (×2): qty 4

## 2021-05-27 MED ORDER — FUROSEMIDE 10 MG/ML IJ SOLN
40.0000 mg | Freq: Two times a day (BID) | INTRAMUSCULAR | Status: DC
  Filled 2021-05-27: qty 4

## 2021-05-27 MED ORDER — AMIODARONE HCL 200 MG PO TABS
200.0000 mg | ORAL_TABLET | Freq: Every day | ORAL | Status: DC
  Administered 2021-05-27 – 2021-05-28 (×2): 200 mg via ORAL
  Filled 2021-05-27 (×3): qty 1

## 2021-05-27 MED ORDER — SODIUM BICARBONATE 650 MG PO TABS
650.0000 mg | ORAL_TABLET | Freq: Three times a day (TID) | ORAL | Status: DC
  Administered 2021-05-27 – 2021-05-28 (×5): 650 mg via ORAL
  Filled 2021-05-27 (×6): qty 1

## 2021-05-27 MED ORDER — INSULIN ASPART 100 UNIT/ML IJ SOLN
0.0000 [IU] | Freq: Three times a day (TID) | INTRAMUSCULAR | Status: DC
  Administered 2021-05-27 – 2021-05-28 (×2): 1 [IU] via SUBCUTANEOUS
  Administered 2021-05-28: 2 [IU] via SUBCUTANEOUS
  Administered 2021-05-28: 5 [IU] via SUBCUTANEOUS
  Filled 2021-05-27 (×2): qty 1

## 2021-05-27 MED ORDER — ADULT MULTIVITAMIN W/MINERALS CH
1.0000 | ORAL_TABLET | Freq: Every day | ORAL | Status: DC
  Administered 2021-05-27 – 2021-05-28 (×2): 1 via ORAL
  Filled 2021-05-27 (×2): qty 1

## 2021-05-27 MED ORDER — LEVOTHYROXINE SODIUM 100 MCG PO TABS
100.0000 ug | ORAL_TABLET | Freq: Every day | ORAL | Status: DC
  Administered 2021-05-28: 100 ug via ORAL
  Filled 2021-05-27 (×2): qty 1

## 2021-05-27 NOTE — ED Notes (Signed)
Pt on cardiac monitor with BP to set cycle every 30 minutes. Continuous pulse oximeter applied.

## 2021-05-27 NOTE — ED Notes (Signed)
Sister called and reported that last time pt was in the hospital he was supposed to be sent back to the nursing facility with oxygen at night. She is concerned pt hasn't been getting his oxygen while at [facility] during the night.

## 2021-05-27 NOTE — ED Provider Notes (Signed)
[facility] EMERGENCY DEPARTMENT Provider Note   CSN: [id number] Arrival date & time: 05/27/21  0359     History Chief Complaint  Patient presents with   Shortness of Breath    Todd [name] is a 65 y.o. male.  The history is provided by the patient and the EMS personnel. The history is limited by the condition of the patient (Severe respiratory distress).  Shortness of Breath He has history of hypertension, diabetes, hyperlipidemia, chronic atrial fibrillation anticoagulated on apixaban, chronic diastolic heart failure and comes in with sudden onset of shortness of breath tonight.  EMS reports initial oxygen saturation of 82% on room air.  He is not usually on supplemental oxygen.  He denies chest pain, heaviness, tightness, pressure.  He denies any nausea or vomiting.   Past Medical History:  Diagnosis Date   AKI (acute kidney injury) ([place])    Constipated    Diabetes mellitus without complication ([place])    Diarrhea    Elevated LFTs    Goiter    Gout    Hyperlipidemia    Hypertension    Leukocytosis    Reactive thrombocytosis    Right BKA infection ([place]) 08/2016   Right leg pain    Sepsis due to undetermined organism [facility])    Thyroid disease    Wound infection after surgery 08/2016    Patient Active Problem List   Diagnosis Date Noted   SOB (shortness of breath)    CAP (community acquired pneumonia) 04/20/2021   Elevated brain natriuretic peptide (BNP) level 04/20/2021   Polysubstance abuse ([place]) 04/20/2021   Noncompliance with medication regimen 04/20/2021   Chronic diastolic CHF (congestive heart failure) ([place]) 04/20/2021   Hypoalbuminemia due to protein-calorie malnutrition ([place]) 04/20/2021   GERD (gastroesophageal reflux disease) 04/20/2021   Candida esophagitis (HCC)    Iron deficiency anemia due to chronic blood loss 01/11/2021   Blood in stool    Atrial fibrillation with RVR ([place]) 01/10/2021   Hypomagnesemia 01/10/2021   Severe Hypophosphatemia  12/28/2020   Non-Sustained V-tach in the setting of Severe Electrolyte Derangement 12/28/2020   Cocaine abuse -on-going 12/28/2020   Alcohol abuse, continuous 12/28/2020   Syncope and collapse-suspect arrhythmia related in the setting of severe electrolyte abnormalities 12/28/2020   Diabetes mellitus type 2 with complications ([place]) 33/29/5188   Tobacco abuse 12/28/2020   Atrial fibrillation, chronic ([place]) 12/26/2020   Hypocalcemia 41/66/0630   Acute metabolic encephalopathy 16/06/930   Hypoglycemia 12/05/2020   AKI (acute kidney injury) ([place]) 12/04/2020   Vitamin D deficiency 06/17/2019   Essential hypertension, benign 08/30/2018   Mixed hyperlipidemia 07/17/2018   Hyponatremia 02/14/2018   Hypokalemia 02/14/2018   Medically noncompliant 02/14/2018   S/P AKA (above knee amputation) bilateral ([place]) 02/12/2018   Current smoker 02/03/2018   Hyperglycemia due to diabetes mellitus ([place]) 02/03/2018   Leukocytosis 02/03/2018   Reactive thrombocytosis 02/03/2018   Urinary incontinence 08/14/2017   Transaminitis    Acute respiratory failure with hypoxia ([place])    Elevated troponin 07/04/2016   Hyperuricemia 06/21/2016   Essential hypertension 01/05/2016   Diabetes type 2, uncontrolled 01/05/2016   Acquired hypothyroidism 05/10/2011    Past Surgical History:  Procedure Laterality Date   ABDOMINAL AORTOGRAM N/A 08/11/2016   Procedure: Abdominal Aortogram;  Surgeon: Waynetta Sandy, MD;  Location: [place] [facility];  Service: Cardiovascular;  Laterality: N/A;   ABDOMINAL AORTOGRAM W/LOWER EXTREMITY N/A 08/15/2016   Procedure: Abdominal Aortogram w/Lower Extremity;  Surgeon: Elam Dutch, MD;  Location: [place] [facility];  Service: Cardiovascular;  Laterality: N/A;   AMPUTATION Right 08/17/2016   Procedure: RIGHT BELOW KNEE AMPUTATION;  Surgeon: Elam Dutch, MD;  Location: [place];  Service: Vascular;  Laterality: Right;   AMPUTATION Right 09/12/2016   Procedure: AMPUTATION  ABOVE KNEE;  Surgeon: Newt Minion, MD;  Location: [place];  Service: Orthopedics;  Laterality: Right;   AMPUTATION Left 08/12/2016   Procedure: LEFT BELOW KNEE AMPUTATION;  Surgeon: Newt Minion, MD;  Location: [place];  Service: Orthopedics;  Laterality: Left;   AMPUTATION Left 11/01/2017   Procedure: LEFT ABOVE KNEE AMPUTATION;  Surgeon: Newt Minion, MD;  Location: [place];  Service: Orthopedics;  Laterality: Left;   APPLICATION OF WOUND VAC Right 09/12/2016   Procedure: APPLICATION OF WOUND VAC ABOVE KNEE;  Surgeon: Newt Minion, MD;  Location: [place];  Service: Orthopedics;  Laterality: Right;   BIOPSY  01/13/2021   Procedure: BIOPSY;  Surgeon: Rogene Houston, MD;  [facility];  Service: Endoscopy;;  esophageal   COLONOSCOPY WITH PROPOFOL N/A 01/13/2021   Procedure: COLONOSCOPY WITH PROPOFOL;  Surgeon: Rogene Houston, MD;  [facility];  Service: Endoscopy;  Laterality: N/A;   ESOPHAGOGASTRODUODENOSCOPY (EGD) WITH PROPOFOL N/A 01/13/2021   Procedure: ESOPHAGOGASTRODUODENOSCOPY (EGD) WITH PROPOFOL;  Surgeon: Rogene Houston, MD;  [facility];  Service: Endoscopy;  Laterality: N/A;   LOWER EXTREMITY ANGIOGRAPHY Bilateral 08/11/2016   Procedure: Lower Extremity Angiography;  Surgeon: Waynetta Sandy, MD;  Location: [place] [facility];  Service: Cardiovascular;  Laterality: Bilateral;   PERIPHERAL VASCULAR BALLOON ANGIOPLASTY Left 08/11/2016   Procedure: Peripheral Vascular Balloon Angioplasty;  Surgeon: Waynetta Sandy, MD;  Location: [place] [facility];  Service: Cardiovascular;  Laterality: Left;  SFA   POLYPECTOMY  01/13/2021   Procedure: POLYPECTOMY;  Surgeon: Rogene Houston, MD;  [facility];  Service: Endoscopy;;   THYROID SURGERY         Family History  Problem Relation Age of Onset   Heart disease Mother    Pneumonia Father    Diabetes Maternal Aunt    Diabetes Maternal Uncle     Social History   Tobacco Use   Smoking  status: Every Day    Packs/day: 0.25    Years: 45.00    Pack years: 11.25    Types: Cigarettes    Last attempt to quit: 11/12/2014    Years since quitting: 6.5   Smokeless tobacco: Never  Vaping Use   Vaping Use: Never used  Substance Use Topics   Alcohol use: Yes    Alcohol/week: 2.0 standard drinks    Types: 1 Glasses of wine, 1 Cans of beer per week   Drug use: No    Home Medications Prior to Admission medications   Medication Sig Start Date End Date Taking? Authorizing Provider  acetaminophen (TYLENOL) 325 MG tablet Take 2 tablets (650 mg total) by mouth every 6 (six) hours as needed for mild pain (or Fever >/= 101). 01/15/21   Johnson, Clanford L, MD  allopurinol (ZYLOPRIM) 100 MG tablet Take 1 tablet (100 mg total) by mouth daily. Patient taking differently: Take 300 mg by mouth daily. 12/06/20   Johnson, Clanford L, MD  amiodarone (PACERONE) 200 MG tablet Take 1 tablet (200 mg total) by mouth daily. 05/24/21   Lendon Colonel, NP  apixaban (ELIQUIS) 5 MG TABS tablet Take 1 tablet (5 mg total) by mouth 2 (two) times daily. 01/16/21  Johnson, Clanford L, MD  atorvastatin (LIPITOR) 40 MG tablet TAKE 1 TABLET BY MOUTH ONCE DAILY AT 6 PM 12/06/20   Johnson, Clanford L, MD  calcium carbonate (TUMS - DOSED IN MG ELEMENTAL CALCIUM) 500 MG chewable tablet Chew 2 tablets (400 mg of elemental calcium total) by mouth 2 (two) times daily with a meal. Patient taking differently: Chew 2 tablets by mouth 3 (three) times daily with meals. 01/15/21   Johnson, Clanford L, MD  cetirizine (ZYRTEC) 10 MG tablet Take 1 tablet (10 mg total) by mouth daily as needed for allergies. For allergy symptoms 12/06/20   Murlean Iba, MD  cholecalciferol (VITAMIN D3) 25 MCG (1000 UNIT) tablet Take 1,000 Units by mouth daily.    [provider]  colchicine 0.6 MG tablet Take 0.6 mg by mouth daily.    [provider]  folic acid (FOLVITE) 1 MG tablet Take 1 tablet (1 mg total) by mouth daily.  12/29/20   Roxan Hockey, MD  glucose blood (ACCU-CHEK AVIVA PLUS) test strip Check blood sugars four times a day 03/12/18   [name], MD  hydrALAZINE (APRESOLINE) 25 MG tablet Take 1 tablet (25 mg total) by mouth 3 (three) times daily. 05/11/21   Patrecia Pour, MD  insulin lispro (HUMALOG) 100 UNIT/ML KwikPen Inject 2-10 Units into the skin. Sliding scale 200-250 2units, 251-300 4units, 301-350 6units, 351-400 8units,401-450 10units 04/08/21   [provider]  isosorbide mononitrate (IMDUR) 30 MG 24 hr tablet Take 0.5 tablets (15 mg total) by mouth daily. Patient taking differently: Take 30 mg by mouth daily. 05/12/21   Patrecia Pour, MD  levothyroxine (SYNTHROID) 100 MCG tablet Take 1 tablet (100 mcg total) by mouth daily before breakfast. 05/12/21   Patrecia Pour, MD  LORazepam (ATIVAN) 0.5 MG tablet Take 1 tablet (0.5 mg total) by mouth every 6 (six) hours as needed. 05/11/21   Patrecia Pour, MD  megestrol (MEGACE) 400 MG/10ML suspension Take 10 mLs (400 mg total) by mouth 2 (two) times daily. For appetite stimulation 12/06/20   Johnson, Clanford L, MD  metoprolol succinate (TOPROL-XL) 100 MG 24 hr tablet Take 1 tablet (100 mg total) by mouth daily. Take with or immediately following a meal. 05/12/21   Patrecia Pour, MD  Multiple Vitamin (MULTIVITAMIN WITH MINERALS) TABS tablet Take 1 tablet by mouth daily. 01/16/21   Johnson, Clanford L, MD  Nutritional Supplements (NUTRITIONAL SHAKE HIGH PROTEIN) LIQD Take 30 mLs by mouth daily.    [provider]  pantoprazole (PROTONIX) 40 MG tablet Take 1 tablet (40 mg total) by mouth 2 (two) times daily. 01/15/21   Johnson, Clanford L, MD  polyethylene glycol (MIRALAX / GLYCOLAX) 17 g packet Take 17 g by mouth daily. 01/16/21   Johnson, Clanford L, MD  sertraline (ZOLOFT) 50 MG tablet Take 1 tablet (50 mg total) by mouth at bedtime. 12/06/20   Johnson, Clanford L, MD  silver sulfADIAZINE (SILVADENE) 1 % cream Apply 1 application topically  daily. Apply to buttock    [provider]  sodium bicarbonate 650 MG tablet Take 650 mg by mouth 3 (three) times daily.    [provider]  thiamine 100 MG tablet Take 1 tablet (100 mg total) by mouth daily. 12/29/20   Roxan Hockey, MD    Allergies    Lisinopril  Review of Systems   Review of Systems  Unable to perform ROS: Severe respiratory distress  Respiratory:  Positive for shortness of breath.  Physical Exam Updated Vital Signs BP (!) 184/99    Pulse 72    Temp 98.1 F (36.7 C) (Oral)    Resp (!) 30    Ht 5\' 8"  (1.727 m)    Wt 59 kg    SpO2 97%    BMI 19.78 kg/m   Physical Exam Vitals and nursing note reviewed.  65 year old male, resting comfortably and in no acute distress. Vital signs are significant for elevated blood pressure and respiratory rate. Oxygen saturation is 97%, which is normal. Head is normocephalic and atraumatic. PERRLA, EOMI. Oropharynx is clear. Neck is nontender and supple without adenopathy or JVD. Back is nontender and there is no CVA tenderness. Lungs have diffuse rales and rhonchi. Chest is nontender. Heart has regular rate and rhythm without murmur. Abdomen is soft, flat, nontender without masses or hepatosplenomegaly and peristalsis is normoactive. Extremities: Bilateral above the knee amputations. Skin is warm and dry without rash. Neurologic: Awake and able to answer questions with yes and no, cranial nerves are intact, moves arms symmetrically.  ED Results / Procedures / Treatments   Labs (all labs ordered are listed, but only abnormal results are displayed) Labs Reviewed  BASIC METABOLIC PANEL - Abnormal; Notable for the following components:      Result Value   CO2 17 (*)    Glucose, Bld 155 (*)    Creatinine, Ser 1.99 (*)    Calcium 7.8 (*)    GFR, Estimated 36 (*)    All other components within normal limits  BRAIN NATRIURETIC PEPTIDE - Abnormal; Notable for the following components:   B Natriuretic Peptide  753.0 (*)    All other components within normal limits  CBC WITH DIFFERENTIAL/PLATELET - Abnormal; Notable for the following components:   WBC 15.5 (*)    RBC 3.60 (*)    Hemoglobin 10.0 (*)    HCT 32.4 (*)    Platelets 669 (*)    Neutro Abs 11.3 (*)    Abs Immature Granulocytes 0.20 (*)    All other components within normal limits  TROPONIN I (HIGH SENSITIVITY) - Abnormal; Notable for the following components:   Troponin I (High Sensitivity) 24 (*)    All other components within normal limits  RESP PANEL BY RT-PCR (FLU A&B, COVID) ARPGX2  MAGNESIUM  TROPONIN I (HIGH SENSITIVITY)    EKG EKG Interpretation  Date/Time:  Thursday May 27 2021 04:10:36 EST Ventricular Rate:  74 PR Interval:  158 QRS Duration: 106 QT Interval:  455 QTC Calculation: 505 R Axis:   2 Text Interpretation: Sinus rhythm Borderline T wave abnormalities Prolonged QT interval When compared with ECG of 05/09/2021, QT has shortened Confirmed by Delora Fuel ([id number]) on 05/27/2021 4:15:07 AM  Radiology DG Chest Port 1 View  Result Date: 05/27/2021 CLINICAL DATA:  Shortness of breath and hypoxia. EXAM: PORTABLE CHEST 1 VIEW COMPARISON:  Portable chest 05/06/2021. FINDINGS: 4:16 a.m., 05/27/2021. There is mild cardiomegaly, increased central vascular distension. There is interval worsening of widespread left-greater-than-right interstitial and patchy airspace disease with left apical sparing. Findings could be due to edema, pneumonia or combination, with small pleural effusions beginning to form. IMPRESSION: Cardiomegaly with increased central vascular distension and worsening right greater than left interstitial and patchy alveolar opacities, with small pleural effusions forming. Findings consistent with CHF with lung opacities which could suggest alveolar edema, pneumonia or combination. Electronically Signed   By: Telford Nab M.D.   On: 05/27/2021 04:29    Procedures Procedures CRITICAL CARE Performed  by: Delora Fuel Total critical care time: 50 minutes Critical care time was exclusive of separately billable procedures and treating other patients. Critical care was necessary to treat or prevent imminent or life-threatening deterioration. Critical care was time spent personally by me on the following activities: development of treatment plan with patient and/or surrogate as well as nursing, discussions with consultants, evaluation of patient's response to treatment, examination of patient, obtaining history from patient or surrogate, ordering and performing treatments and interventions, ordering and review of laboratory studies, ordering and review of radiographic studies, pulse oximetry and re-evaluation of patient's condition.   Medications Ordered in ED Medications  nitroGLYCERIN (NITROGLYN) 2 % ointment 1 inch (1 inch Topical Given 05/27/21 0551)  furosemide (LASIX) injection 40 mg (40 mg Intravenous Given 05/27/21 0551)    ED Course  I have reviewed the triage vital signs and the nursing notes.  Pertinent labs & imaging results that were available during my care of the patient were reviewed by me and considered in my medical decision making (see chart for details).   MDM Rules/Calculators/A&P                         Acute dyspnea strongly suggestive of heart failure.  Consider pneumonia.  Doubt pulmonary embolism and patient was anticoagulated.  Doubt pneumothorax.  Old records are reviewed, and he had been admitted 1 month ago for community-acquired pneumonia.  Chest x-ray has been ordered as well as screening labs including BNP and troponin.  ECG does show slightly prolonged QT interval although it is shorter than most recent ECG.  We will check magnesium level.  Chest x-ray is consistent with heart failure.  Magnesium is normal.  Labs do show renal insufficiency which is unchanged from baseline, anemia which is unchanged from baseline, thrombocytosis which is unchanged from baseline.   BNP is significantly elevated consistent with heart failure exacerbation.  Mild elevation of troponin is felt to be due to demand ischemia and not ACS.  He is given intravenous furosemide and placed on topical nitroglycerin.  Case is discussed with Dr. Josephine Cables of [facility] hospitalist, who agrees to admit the patient.  Final Clinical Impression(s) / ED Diagnoses Final diagnoses:  Acute on chronic diastolic heart failure (HCC)  Elevated troponin  Renal insufficiency  Normochromic normocytic anemia  Thrombocytosis    Rx / DC Orders ED Discharge Orders     None        Delora Fuel, MD 36/46/80 [phone]

## 2021-05-27 NOTE — ED Notes (Signed)
Update given to Haze Rushing, LPN @ [facility].

## 2021-05-27 NOTE — ED Notes (Signed)
Pt weaned down to 2L [place] per hospitalist order.

## 2021-05-27 NOTE — ED Notes (Signed)
Dietary called for lunch tray.

## 2021-05-27 NOTE — ED Notes (Signed)
Pt weaned off of oxygen completely. O2 sats 94%.

## 2021-05-27 NOTE — ED Notes (Signed)
Dietary called for lunch tray again at this time.

## 2021-05-27 NOTE — H&P (Signed)
History and Physical  [name] AYT:[id number] DOB: 1956/03/16 DOA: 05/27/2021  Referring physician: Delora Fuel, MD PCP: Pcp, No  Patient coming from: Home  Chief Complaint: Shortness of breath  HPI: Todd [name] is a 65 y.o. male with medical history significant for  essential hypertension, hypothyroidism, type 2 diabetes mellitus, chronic atrial fibrillation, status post bilateral AKA, history of polysubstance abuse (alcohol, tobacco and cocaine) and noncompliance with medication treatment regimen who presents to the emergency department via EMS due to shortness of breath which started few hours prior to arrival to the ED.  EMS was activated, on arrival of EMS team, O2 sat was noted to be 82% on room air.  Patient does not use oxygen at baseline, he denies chest pain, fever, chills, nausea, vomiting. Patient was recently admitted from 11/7 to 11/29 due to similar presentation with CHF right-sided pneumonia as well as COPD exacerbation.  ED Course:  In the  emergency department, he was initially tachypneic, BP was 158/88, patient was initially placed on NRB with O2 sats 100% on room air, this was weaned down to supplemental oxygen via [place] at 4 LPM with O2 sat at 94-98%.  Work-up in the ED showed showed leukocytosis, thrombocytosis, troponin x 2 -24 > 27, BNP 753 (was 859 1 month ago), BUN/creatinine 22/1.99 (creatinine within baseline range).  Influenza A, B, SARS coronavirus 2 was negative. Chest x-ray was suggestive of CHF with lung opacities suggesting alveolar edema, pneumonia or combination IV Lasix 40 mg x 1 and nitroglycerine paste was given.  Review of Systems: Review of systems as noted in the HPI. All other systems reviewed and are negative.   Past Medical History:  Diagnosis Date   AKI (acute kidney injury) ([place])    Constipated    Diabetes mellitus without complication ([place])    Diarrhea    Elevated LFTs    Goiter    Gout    Hyperlipidemia    Hypertension     Leukocytosis    Reactive thrombocytosis    Right BKA infection ([place]) 08/2016   Right leg pain    Sepsis due to undetermined organism [facility])    Thyroid disease    Wound infection after surgery 08/2016   Past Surgical History:  Procedure Laterality Date   ABDOMINAL AORTOGRAM N/A 08/11/2016   Procedure: Abdominal Aortogram;  Surgeon: Waynetta Sandy, MD;  Location: [place] [facility];  Service: Cardiovascular;  Laterality: N/A;   ABDOMINAL AORTOGRAM W/LOWER EXTREMITY N/A 08/15/2016   Procedure: Abdominal Aortogram w/Lower Extremity;  Surgeon: Elam Dutch, MD;  Location: [place] [facility];  Service: Cardiovascular;  Laterality: N/A;   AMPUTATION Right 08/17/2016   Procedure: RIGHT BELOW KNEE AMPUTATION;  Surgeon: Elam Dutch, MD;  Location: [place];  Service: Vascular;  Laterality: Right;   AMPUTATION Right 09/12/2016   Procedure: AMPUTATION ABOVE KNEE;  Surgeon: Newt Minion, MD;  Location: [place];  Service: Orthopedics;  Laterality: Right;   AMPUTATION Left 08/12/2016   Procedure: LEFT BELOW KNEE AMPUTATION;  Surgeon: Newt Minion, MD;  Location: [place];  Service: Orthopedics;  Laterality: Left;   AMPUTATION Left 11/01/2017   Procedure: LEFT ABOVE KNEE AMPUTATION;  Surgeon: Newt Minion, MD;  Location: [place];  Service: Orthopedics;  Laterality: Left;   APPLICATION OF WOUND VAC Right 09/12/2016   Procedure: APPLICATION OF WOUND VAC ABOVE KNEE;  Surgeon: Newt Minion, MD;  Location: [place];  Service: Orthopedics;  Laterality: Right;   BIOPSY  01/13/2021  Procedure: BIOPSY;  Surgeon: Rogene Houston, MD;  [facility];  Service: Endoscopy;;  esophageal   COLONOSCOPY WITH PROPOFOL N/A 01/13/2021   Procedure: COLONOSCOPY WITH PROPOFOL;  Surgeon: Rogene Houston, MD;  [facility];  Service: Endoscopy;  Laterality: N/A;   ESOPHAGOGASTRODUODENOSCOPY (EGD) WITH PROPOFOL N/A 01/13/2021   Procedure: ESOPHAGOGASTRODUODENOSCOPY (EGD) WITH PROPOFOL;  Surgeon: Rogene Houston,  MD;  [facility];  Service: Endoscopy;  Laterality: N/A;   LOWER EXTREMITY ANGIOGRAPHY Bilateral 08/11/2016   Procedure: Lower Extremity Angiography;  Surgeon: Waynetta Sandy, MD;  Location: [place] [facility];  Service: Cardiovascular;  Laterality: Bilateral;   PERIPHERAL VASCULAR BALLOON ANGIOPLASTY Left 08/11/2016   Procedure: Peripheral Vascular Balloon Angioplasty;  Surgeon: Waynetta Sandy, MD;  Location: [place] [facility];  Service: Cardiovascular;  Laterality: Left;  SFA   POLYPECTOMY  01/13/2021   Procedure: POLYPECTOMY;  Surgeon: Rogene Houston, MD;  [facility];  Service: Endoscopy;;   THYROID SURGERY      Social History:  reports that he has been smoking cigarettes. He has a 11.25 pack-year smoking history. He has never used smokeless tobacco. He reports current alcohol use of about 2.0 standard drinks per week. He reports that he does not use drugs.   Allergies  Allergen Reactions   Lisinopril Other (See Comments)    Hyperkalemia / Renal failure    Family History  Problem Relation Age of Onset   Heart disease Mother    Pneumonia Father    Diabetes Maternal Aunt    Diabetes Maternal Uncle      Prior to Admission medications   Medication Sig Start Date End Date Taking? Authorizing Provider  acetaminophen (TYLENOL) 325 MG tablet Take 2 tablets (650 mg total) by mouth every 6 (six) hours as needed for mild pain (or Fever >/= 101). 01/15/21   Johnson, Clanford L, MD  allopurinol (ZYLOPRIM) 100 MG tablet Take 1 tablet (100 mg total) by mouth daily. Patient taking differently: Take 300 mg by mouth daily. 12/06/20   Johnson, Clanford L, MD  amiodarone (PACERONE) 200 MG tablet Take 1 tablet (200 mg total) by mouth daily. 05/24/21   Lendon Colonel, NP  apixaban (ELIQUIS) 5 MG TABS tablet Take 1 tablet (5 mg total) by mouth 2 (two) times daily. 01/16/21   Johnson, Clanford L, MD  atorvastatin (LIPITOR) 40 MG tablet TAKE 1 TABLET BY MOUTH  ONCE DAILY AT 6 PM 12/06/20   Johnson, Clanford L, MD  calcium carbonate (TUMS - DOSED IN MG ELEMENTAL CALCIUM) 500 MG chewable tablet Chew 2 tablets (400 mg of elemental calcium total) by mouth 2 (two) times daily with a meal. Patient taking differently: Chew 2 tablets by mouth 3 (three) times daily with meals. 01/15/21   Johnson, Clanford L, MD  cetirizine (ZYRTEC) 10 MG tablet Take 1 tablet (10 mg total) by mouth daily as needed for allergies. For allergy symptoms 12/06/20   Murlean Iba, MD  cholecalciferol (VITAMIN D3) 25 MCG (1000 UNIT) tablet Take 1,000 Units by mouth daily.    [provider]  colchicine 0.6 MG tablet Take 0.6 mg by mouth daily.    [provider]  folic acid (FOLVITE) 1 MG tablet Take 1 tablet (1 mg total) by mouth daily. 12/29/20   Roxan Hockey, MD  glucose blood (ACCU-CHEK AVIVA PLUS) test strip Check blood sugars four times a day 03/12/18   [name], MD  hydrALAZINE (APRESOLINE) 25 MG tablet  Take 1 tablet (25 mg total) by mouth 3 (three) times daily. 05/11/21   Patrecia Pour, MD  insulin lispro (HUMALOG) 100 UNIT/ML KwikPen Inject 2-10 Units into the skin. Sliding scale 200-250 2units, 251-300 4units, 301-350 6units, 351-400 8units,401-450 10units 04/08/21   [provider]  isosorbide mononitrate (IMDUR) 30 MG 24 hr tablet Take 0.5 tablets (15 mg total) by mouth daily. Patient taking differently: Take 30 mg by mouth daily. 05/12/21   Patrecia Pour, MD  levothyroxine (SYNTHROID) 100 MCG tablet Take 1 tablet (100 mcg total) by mouth daily before breakfast. 05/12/21   Patrecia Pour, MD  LORazepam (ATIVAN) 0.5 MG tablet Take 1 tablet (0.5 mg total) by mouth every 6 (six) hours as needed. 05/11/21   Patrecia Pour, MD  megestrol (MEGACE) 400 MG/10ML suspension Take 10 mLs (400 mg total) by mouth 2 (two) times daily. For appetite stimulation 12/06/20   Johnson, Clanford L, MD  metoprolol succinate (TOPROL-XL) 100 MG 24 hr tablet Take 1 tablet  (100 mg total) by mouth daily. Take with or immediately following a meal. 05/12/21   Patrecia Pour, MD  Multiple Vitamin (MULTIVITAMIN WITH MINERALS) TABS tablet Take 1 tablet by mouth daily. 01/16/21   Johnson, Clanford L, MD  Nutritional Supplements (NUTRITIONAL SHAKE HIGH PROTEIN) LIQD Take 30 mLs by mouth daily.    [provider]  pantoprazole (PROTONIX) 40 MG tablet Take 1 tablet (40 mg total) by mouth 2 (two) times daily. 01/15/21   Johnson, Clanford L, MD  polyethylene glycol (MIRALAX / GLYCOLAX) 17 g packet Take 17 g by mouth daily. 01/16/21   Johnson, Clanford L, MD  sertraline (ZOLOFT) 50 MG tablet Take 1 tablet (50 mg total) by mouth at bedtime. 12/06/20   Johnson, Clanford L, MD  silver sulfADIAZINE (SILVADENE) 1 % cream Apply 1 application topically daily. Apply to buttock    [provider]  sodium bicarbonate 650 MG tablet Take 650 mg by mouth 3 (three) times daily.    [provider]  thiamine 100 MG tablet Take 1 tablet (100 mg total) by mouth daily. 12/29/20   Roxan Hockey, MD    Physical Exam: BP (!) 159/96    Pulse 67    Temp 98.1 F (36.7 C) (Oral)    Resp 17    Ht 5\' 8"  (1.727 m)    Wt 59 kg    SpO2 97%    BMI 19.78 kg/m   General: 65 y.o. year-old male well developed well nourished in no acute distress.  Alert and oriented x3. HEENT: NCAT, EOMI Neck: Supple, trachea medial Cardiovascular: Regular rate and rhythm with no rubs or gallops.  No thyromegaly or JVD noted.  No lower extremity edema. 2/4 pulses in all 4 extremities. Respiratory: Bilateral Rales on auscultation (R > L ). No wheezes  Abdomen: Soft, nontender nondistended with normal bowel sounds x4 quadrants. Muskuloskeletal: Bilateral AKA.  No cyanosis or clubbing noted bilaterally Neuro: CN II-XII intact, sensation, reflexes intact Skin: No ulcerative lesions noted or rashes Psychiatry: Mood is appropriate for condition and setting          Labs on Admission:  Basic Metabolic  Panel: Recent Labs  Lab 05/27/21 0427  NA 139  K 4.0  CL 111  CO2 17*  GLUCOSE 155*  BUN 22  CREATININE 1.99*  CALCIUM 7.8*  MG 1.7   Liver Function Tests: No results for input(s): AST, ALT, ALKPHOS, BILITOT, PROT, ALBUMIN in the last 168 hours. No results  for input(s): LIPASE, AMYLASE in the last 168 hours. No results for input(s): AMMONIA in the last 168 hours. CBC: Recent Labs  Lab 05/27/21 0427  WBC 15.5*  NEUTROABS 11.3*  HGB 10.0*  HCT 32.4*  MCV 90.0  PLT 669*   Cardiac Enzymes: No results for input(s): CKTOTAL, CKMB, CKMBINDEX, TROPONINI in the last 168 hours.  BNP (last 3 results) Recent Labs    04/19/21 0019 05/27/21 0427  BNP 859.0* 753.0*    ProBNP (last 3 results) No results for input(s): PROBNP in the last 8760 hours.  CBG: No results for input(s): GLUCAP in the last 168 hours.  Radiological Exams on Admission: DG Chest Port 1 View  Result Date: 05/27/2021 CLINICAL DATA:  Shortness of breath and hypoxia. EXAM: PORTABLE CHEST 1 VIEW COMPARISON:  Portable chest 05/06/2021. FINDINGS: 4:16 a.m., 05/27/2021. There is mild cardiomegaly, increased central vascular distension. There is interval worsening of widespread left-greater-than-right interstitial and patchy airspace disease with left apical sparing. Findings could be due to edema, pneumonia or combination, with small pleural effusions beginning to form. IMPRESSION: Cardiomegaly with increased central vascular distension and worsening right greater than left interstitial and patchy alveolar opacities, with small pleural effusions forming. Findings consistent with CHF with lung opacities which could suggest alveolar edema, pneumonia or combination. Electronically Signed   By: Telford Nab M.D.   On: 05/27/2021 04:29    EKG: I independently viewed the EKG done and my findings are as followed: Normal sinus rhythm at a rate of 74bpm with prolonged Qtc (572ms)  Assessment/Plan Present on Admission:   Elevated troponin  Leukocytosis  Elevated brain natriuretic peptide (BNP) level  Atrial fibrillation with RVR (HCC)  Acute respiratory failure with hypoxia (HCC)  Principal Problem:   CHF (congestive heart failure) (HCC) Active Problems:   Elevated troponin   Acute respiratory failure with hypoxia (HCC)   Leukocytosis   Thrombocytosis   Atrial fibrillation with RVR (HCC)   Elevated brain natriuretic peptide (BNP) level  Acute respiratory failure with hypoxia possibly secondary to acute on chronic HFrEF Elevated BNP (753); this was 859 last month Continue total input/output, daily weights and fluid restriction Continue IV Lasix 40 mg twice daily Continue Cardiac diet  Echocardiogram done on 04/20/2021 showed LVEF of 45-50%.  LV has mildly decreased function.  Cardiology was consulted and we shall await further recommendation  Questionable CAP POA Chest x-ray was suggestive of possible pneumonia which was similar to last presentation when admitted last month. Procalcitonin will be checked prior to starting antibiotics  Prolonged QTc (505 ms) Avoid QT prolonging drugs Magnesium was 1.7, this was replenished to maintain a goal of 2.0 Repeat EKG in the morning  Chronic kidney injury stage 3B BUN/creatinine 22/1.99 (creatinine within baseline range). Renally adjust medications, avoid nephrotoxic agents/dehydration/hypotension  Elevated troponin possibly secondary to type II demand ischemia Troponin x 2 - 24  > 27, patient denies chest pain Continue to trend troponin  Type 2 diabetes mellitus Hemoglobin A1c about 53-month ago was 7.9 Continue ISS and hypoglycemic protocol  Essential hypertension Continue metoprolol, Lasix  Chronic atrial fibrillation Continue Toprol-XL, Eliquis   GERD Continue Protonix  Mixed hyperlipidemia Continue Lipitor  Polysubstance abuse (alcohol, tobacco and cocaine) Continue folic acid, thiamine, multivitamin  Noncompliance with medication  regimen Patient was counseled on importance of being compliant with medication regimen  Acquired hypothyroidism Continue Synthroid  Anemia of chronic disease Stable; hemoglobin within baseline range  DVT prophylaxis: Eliquis  Code Status: Full code  Family Communication: None  at bedside  Disposition Plan:  Patient is from:                        home Anticipated DC to:                   SNF or family members home Anticipated DC date:               2-3 days Anticipated DC barriers:         Patient requires inpatient management due to Acute respiratory failure with hypoxia possibly secondary to acute on chronic HFrEF    Consults called: Cardiology  Admission status: inpatient    Bernadette Hoit MD [facility]  05/27/2021, 8:47 AM

## 2021-05-27 NOTE — ED Triage Notes (Signed)
Pt sent over from [facility] for sob. Per facility was was 82% on room air and is not on oxygen at facility.

## 2021-05-27 NOTE — Progress Notes (Signed)
Patient admitted after midnight.  Here from SNF with possible volume overload.  Has diuresed about 1L thus far. Denies palpitation or racing heart.   Weaned to room air at rest- -there was ? If patient needs O2 at night while sleeping, will monitor.   Doubt PNA as pro calcitonin is low.  Will trend as he does have an elevated WBC count but appears to be chronic  Eulogio Bear DO

## 2021-05-28 ENCOUNTER — Encounter (HOSPITAL_COMMUNITY): Payer: Self-pay | Admitting: Internal Medicine

## 2021-05-28 DIAGNOSIS — I5043 Acute on chronic combined systolic (congestive) and diastolic (congestive) heart failure: Secondary | ICD-10-CM

## 2021-05-28 DIAGNOSIS — I5033 Acute on chronic diastolic (congestive) heart failure: Secondary | ICD-10-CM

## 2021-05-28 DIAGNOSIS — I509 Heart failure, unspecified: Secondary | ICD-10-CM

## 2021-05-28 DIAGNOSIS — E118 Type 2 diabetes mellitus with unspecified complications: Secondary | ICD-10-CM

## 2021-05-28 LAB — CBC
HCT: 29.6 % — ABNORMAL LOW (ref 39.0–52.0)
Hemoglobin: 9.2 g/dL — ABNORMAL LOW (ref 13.0–17.0)
MCH: 27.1 pg (ref 26.0–34.0)
MCHC: 31.1 g/dL (ref 30.0–36.0)
MCV: 87.3 fL (ref 80.0–100.0)
Platelets: 617 10*3/uL — ABNORMAL HIGH (ref 150–400)
RBC: 3.39 MIL/uL — ABNORMAL LOW (ref 4.22–5.81)
RDW: 15 % (ref 11.5–15.5)
WBC: 12.5 10*3/uL — ABNORMAL HIGH (ref 4.0–10.5)
nRBC: 0 % (ref 0.0–0.2)

## 2021-05-28 LAB — GLUCOSE, CAPILLARY
Glucose-Capillary: 132 mg/dL — ABNORMAL HIGH (ref 70–99)
Glucose-Capillary: 274 mg/dL — ABNORMAL HIGH (ref 70–99)
Glucose-Capillary: 189 mg/dL — ABNORMAL HIGH (ref 70–99)
Glucose-Capillary: 237 mg/dL — ABNORMAL HIGH (ref 70–99)

## 2021-05-28 LAB — BASIC METABOLIC PANEL
Anion gap: 12 (ref 5–15)
BUN: 29 mg/dL — ABNORMAL HIGH (ref 8–23)
CO2: 20 mmol/L — ABNORMAL LOW (ref 22–32)
Calcium: 7.8 mg/dL — ABNORMAL LOW (ref 8.9–10.3)
Chloride: 110 mmol/L (ref 98–111)
Creatinine, Ser: 2.07 mg/dL — ABNORMAL HIGH (ref 0.61–1.24)
GFR, Estimated: 35 mL/min — ABNORMAL LOW (ref >60)
Glucose, Bld: 157 mg/dL — ABNORMAL HIGH (ref 70–99)
Potassium: 3.4 mmol/L — ABNORMAL LOW (ref 3.5–5.1)
Sodium: 142 mmol/L (ref 135–145)

## 2021-05-28 MED ORDER — POTASSIUM CHLORIDE CRYS ER 20 MEQ PO TBCR
20.0000 meq | EXTENDED_RELEASE_TABLET | Freq: Every day | ORAL | Status: DC
Start: 2021-05-29 — End: 2021-05-29
  Filled 2021-05-28: qty 1

## 2021-05-28 MED ORDER — LORAZEPAM 0.5 MG PO TABS: 0.5000 mg | ORAL_TABLET | Freq: Four times a day (QID) | ORAL | 0 refills | Status: DC | PRN

## 2021-05-28 MED ORDER — POTASSIUM CHLORIDE CRYS ER 20 MEQ PO TBCR
40.0000 meq | EXTENDED_RELEASE_TABLET | Freq: Once | ORAL | Status: AC
  Administered 2021-05-28: 40 meq via ORAL
  Filled 2021-05-28: qty 2

## 2021-05-28 MED ORDER — FUROSEMIDE 40 MG PO TABS: 40.0000 mg | ORAL_TABLET | Freq: Every day | ORAL | Status: DC

## 2021-05-28 MED ORDER — POTASSIUM CHLORIDE CRYS ER 20 MEQ PO TBCR: 20.0000 meq | EXTENDED_RELEASE_TABLET | Freq: Every day | ORAL | Status: DC

## 2021-05-28 MED ORDER — ISOSORBIDE MONONITRATE ER 30 MG PO TB24: 30.0000 mg | ORAL_TABLET | Freq: Every day | ORAL | Status: DC

## 2021-05-28 MED ORDER — FUROSEMIDE 40 MG PO TABS
40.0000 mg | ORAL_TABLET | Freq: Every day | ORAL | Status: DC
  Filled 2021-05-28: qty 1

## 2021-05-28 MED ORDER — APIXABAN 5 MG PO TABS
5.0000 mg | ORAL_TABLET | Freq: Two times a day (BID) | ORAL | Status: DC
  Administered 2021-05-28: 5 mg via ORAL
  Filled 2021-05-28 (×2): qty 1

## 2021-05-28 NOTE — TOC Transition Note (Signed)
Transition of Care [facility]) - CM/SW Discharge Note   Patient Details  Name: Todd [name] MRN: [id number] Date of Birth: 10-Jan-1956  [facility] of [facility] CM/SW Contact:  Natasha Bence, LCSW Phone Number: 05/28/2021, 12:42 PM   Clinical Narrative:    CSW notified of patient's readiness for discharge. Debbie with [facility] agreeable to take patient. CSW faxed FL2 and d/c summary. Nurse agreeable to call report. CSW contacted EMS. And completed med necessity. TOC to signing off.     Barriers to Discharge: Continued Medical Work up   Patient Goals and CMS Choice        Discharge Placement                       Discharge Plan and Services                                     Social Determinants of Health (SDOH) Interventions     Readmission Risk Interventions No flowsheet data found.

## 2021-05-28 NOTE — NC FL2 (Signed)
[place] LEVEL OF CARE SCREENING TOOL     IDENTIFICATION  Patient Name: Todd [name] Birthdate: 03-17-1956 Sex: male Admission Date (Current Location): 05/27/2021  [facility] and [place] Number:  [employer] and Address:  [place] [address], [place]      Provider Number: [id number]  Attending Physician Name and Address:  Geradine Girt, DO  Relative Name and Phone Number:  [name] [phone]    Current Level of Care: Hospital Recommended Level of Care: Nursing Facility Prior Approval Number:    Date Approved/Denied:   PASRR Number:    Discharge Plan: SNF    Current Diagnoses: Patient Active Problem List   Diagnosis Date Noted   CHF (congestive heart failure) ([place]) 05/27/2021   SOB (shortness of breath)    CAP (community acquired pneumonia) 04/20/2021   Elevated brain natriuretic peptide (BNP) level 04/20/2021   Polysubstance abuse ([place]) 04/20/2021   Noncompliance with medication regimen 04/20/2021   Chronic diastolic CHF (congestive heart failure) ([place]) 04/20/2021   Hypoalbuminemia due to protein-calorie malnutrition ([place]) 04/20/2021   GERD (gastroesophageal reflux disease) 04/20/2021   Candida esophagitis (HCC)    Iron deficiency anemia due to chronic blood loss 01/11/2021   Blood in stool    Atrial fibrillation with RVR ([place]) 01/10/2021   Hypomagnesemia 01/10/2021   Severe Hypophosphatemia 12/28/2020   Non-Sustained V-tach in the setting of Severe Electrolyte Derangement 12/28/2020   Cocaine abuse -on-going 12/28/2020   Alcohol abuse, continuous 12/28/2020   Syncope and collapse-suspect arrhythmia related in the setting of severe electrolyte abnormalities 12/28/2020   Diabetes mellitus type 2 with complications ([place]) 85/27/7824   Tobacco abuse 12/28/2020   Atrial fibrillation, chronic (HCC) 12/26/2020   Hypocalcemia 23/53/6144   Acute metabolic encephalopathy 31/54/0086   Hypoglycemia 12/05/2020    AKI (acute kidney injury) ([place]) 12/04/2020   Vitamin D deficiency 06/17/2019   Essential hypertension, benign 08/30/2018   Mixed hyperlipidemia 07/17/2018   Hyponatremia 02/14/2018   Hypokalemia 02/14/2018   Medically noncompliant 02/14/2018   S/P AKA (above knee amputation) bilateral ([place]) 02/12/2018   Current smoker 02/03/2018   Hyperglycemia due to diabetes mellitus ([place]) 02/03/2018   Leukocytosis 02/03/2018   Thrombocytosis 02/03/2018   Urinary incontinence 08/14/2017   Transaminitis    Acute respiratory failure with hypoxia (HCC)    Elevated troponin 07/04/2016   Hyperuricemia 06/21/2016   Essential hypertension 01/05/2016   Diabetes type 2, uncontrolled 01/05/2016   Acquired hypothyroidism 05/10/2011    Orientation RESPIRATION BLADDER Height & Weight     Self, Time, Situation, Place  Normal Continent, External catheter Weight: 145 lb 15.1 oz (66.2 kg) Height:  5\' 8"  (172.7 cm)  BEHAVIORAL SYMPTOMS/MOOD NEUROLOGICAL BOWEL NUTRITION STATUS      Continent Diet (see d/c summary)  AMBULATORY STATUS COMMUNICATION OF NEEDS Skin   Extensive Assist Verbally Normal                       Personal Care Assistance Level of Assistance  Bathing, Feeding, Dressing Bathing Assistance: Maximum assistance Feeding assistance: Independent Dressing Assistance: Maximum assistance     Functional Limitations Info  Sight, Hearing, Speech Sight Info: Adequate Hearing Info: Adequate Speech Info: Adequate    SPECIAL CARE FACTORS FREQUENCY                       Contractures Contractures Info: Not present    Additional Factors Info  Code Status, Allergies, Insulin Sliding Scale,  Isolation Precautions Code Status Info: FULL Allergies Info: Lisinopril   Insulin Sliding Scale Info: insulin aspart (novoLOG) injection 0-5 Units daily at bedtime,insulin aspart (novoLOG) injection 0-9 Units 3 times daily with meals       Current Medications (05/28/2021):  This is the current  hospital active medication list Current Facility-Administered Medications  Medication Dose Route Frequency Provider Last Rate Last Admin   amiodarone (PACERONE) tablet 200 mg  200 mg Oral Daily Vann, Jessica U, DO   200 mg at 05/28/21 0908   apixaban (ELIQUIS) tablet 5 mg  5 mg Oral BID Vann, Jessica U, DO       atorvastatin (LIPITOR) tablet 40 mg  40 mg Oral Daily Adefeso, Oladapo, DO   40 mg at 53/29/92 4268   folic acid (FOLVITE) tablet 1 mg  1 mg Oral Daily Adefeso, Oladapo, DO   1 mg at 05/28/21 0908   [START ON 05/29/2021] furosemide (LASIX) tablet 40 mg  40 mg Oral Daily Vann, Jessica U, DO       hydrALAZINE (APRESOLINE) tablet 25 mg  25 mg Oral TID Vann, Jessica U, DO   25 mg at 05/28/21 0908   insulin aspart (novoLOG) injection 0-5 Units  0-5 Units Subcutaneous QHS Adefeso, Oladapo, DO       insulin aspart (novoLOG) injection 0-9 Units  0-9 Units Subcutaneous TID WC Adefeso, Oladapo, DO   5 Units at 05/28/21 1124   isosorbide mononitrate (IMDUR) 24 hr tablet 30 mg  30 mg Oral Daily Vann, Jessica U, DO   30 mg at 05/28/21 0908   levothyroxine (SYNTHROID) tablet 100 mcg  100 mcg Oral QAC breakfast Adefeso, Oladapo, DO   100 mcg at 05/28/21 0908   megestrol (MEGACE) 400 MG/10ML suspension 400 mg  400 mg Oral BID Eulogio Bear U, DO   400 mg at 05/28/21 0908   metoprolol succinate (TOPROL-XL) 24 hr tablet 100 mg  100 mg Oral Daily Adefeso, Oladapo, DO   100 mg at 05/28/21 0907   multivitamin with minerals tablet 1 tablet  1 tablet Oral Daily Adefeso, Oladapo, DO   1 tablet at 05/28/21 0912   pantoprazole (PROTONIX) EC tablet 40 mg  40 mg Oral BID Adefeso, Oladapo, DO   40 mg at 05/28/21 0908   polyethylene glycol (MIRALAX / GLYCOLAX) packet 17 g  17 g Oral Daily Eulogio Bear U, DO   17 g at 05/28/21 0908   [START ON 05/29/2021] potassium chloride SA (KLOR-CON M) CR tablet 20 mEq  20 mEq Oral Daily Vann, Jessica U, DO       sertraline (ZOLOFT) tablet 50 mg  50 mg Oral QHS Vann, Jessica U, DO    50 mg at 05/27/21 2101   sodium bicarbonate tablet 650 mg  650 mg Oral TID Eulogio Bear U, DO   650 mg at 05/28/21 3419   thiamine tablet 100 mg  100 mg Oral Daily Adefeso, Oladapo, DO   100 mg at 05/28/21 0908     Discharge Medications: Medication List       STOP taking these medications     colchicine 0.6 MG tablet    silver sulfADIAZINE 1 % cream Commonly known as: SILVADENE           TAKE these medications     acetaminophen 325 MG tablet Commonly known as: TYLENOL Take 2 tablets (650 mg total) by mouth every 6 (six) hours as needed for mild pain (or Fever >/= 101).    allopurinol 100 MG  tablet Commonly known as: ZYLOPRIM Take 1 tablet (100 mg total) by mouth daily. What changed: how much to take    amiodarone 200 MG tablet Commonly known as: PACERONE Take 1 tablet (200 mg total) by mouth daily.    apixaban 5 MG Tabs tablet Commonly known as: ELIQUIS Take 1 tablet (5 mg total) by mouth 2 (two) times daily.    atorvastatin 40 MG tablet Commonly known as: LIPITOR TAKE 1 TABLET BY MOUTH ONCE DAILY AT 6 PM What changed:  how much to take how to take this when to take this additional instructions    calcium carbonate 500 MG chewable tablet Commonly known as: TUMS - dosed in mg elemental calcium Chew 2 tablets (400 mg of elemental calcium total) by mouth 2 (two) times daily with a meal. What changed: when to take this    cetirizine 10 MG tablet Commonly known as: ZYRTEC Take 1 tablet (10 mg total) by mouth daily as needed for allergies. For allergy symptoms    cholecalciferol 25 MCG (1000 UNIT) tablet Commonly known as: VITAMIN D3 Take 1,000 Units by mouth daily.    folic acid 1 MG tablet Commonly known as: FOLVITE Take 1 tablet (1 mg total) by mouth daily.    furosemide 40 MG tablet Commonly known as: LASIX Take 1 tablet (40 mg total) by mouth daily. Start taking on: May 29, 2021    glucose blood test strip Commonly known as: Accu-Chek Aviva  Plus Check blood sugars four times a day    hydrALAZINE 25 MG tablet Commonly known as: APRESOLINE Take 1 tablet (25 mg total) by mouth 3 (three) times daily.    insulin lispro 100 UNIT/ML KwikPen Commonly known as: HUMALOG Inject 2-10 Units into the skin. Sliding scale 200-250 2units, 251-300 4units, 301-350 6units, 351-400 8units,401-450 10units    isosorbide mononitrate 30 MG 24 hr tablet Commonly known as: IMDUR Take 1 tablet (30 mg total) by mouth daily.    levothyroxine 100 MCG tablet Commonly known as: SYNTHROID Take 1 tablet (100 mcg total) by mouth daily before breakfast.    LORazepam 0.5 MG tablet Commonly known as: ATIVAN Take 1 tablet (0.5 mg total) by mouth every 6 (six) hours as needed for anxiety.    megestrol 400 MG/10ML suspension Commonly known as: MEGACE Take 10 mLs (400 mg total) by mouth 2 (two) times daily. For appetite stimulation    metoprolol succinate 100 MG 24 hr tablet Commonly known as: TOPROL-XL Take 1 tablet (100 mg total) by mouth daily. Take with or immediately following a meal.    multivitamin with minerals Tabs tablet Take 1 tablet by mouth daily.    pantoprazole 40 MG tablet Commonly known as: PROTONIX Take 1 tablet (40 mg total) by mouth 2 (two) times daily.    polyethylene glycol 17 g packet Commonly known as: MIRALAX / GLYCOLAX Take 17 g by mouth daily.    potassium chloride SA 20 MEQ tablet Commonly known as: KLOR-CON M Take 1 tablet (20 mEq total) by mouth daily. Start taking on: May 29, 2021    PROTEIN PO Take 30 mLs by mouth daily. Liquid protein for low albumin    sertraline 50 MG tablet Commonly known as: ZOLOFT Take 1 tablet (50 mg total) by mouth at bedtime.    sodium bicarbonate 650 MG tablet Take 650 mg by mouth 3 (three) times daily.    thiamine 100 MG tablet Take 1 tablet (100 mg total) by mouth daily.  Relevant Imaging Results:  Relevant Lab Results:   Additional  Information [phone]  Natasha Bence, LCSW

## 2021-05-28 NOTE — Discharge Summary (Signed)
Physician Discharge Summary  Todd [name] YKD:[id number] DOB: May 21, 1956 DOA: 05/27/2021  PCP: Merryl Hacker, No  Admit date: 05/27/2021 Discharge date: 05/28/2021  Admitted From: SNF Discharge disposition: SNF   Recommendations for Outpatient Follow-Up:  PRN O2 at night BMP Tuesday for Cr and K- will need adjustment of lasix due to kidney failure Needs close follow up with renal and cardiology Fluid restrict to 2076ml/day Follow Qtc- aggressive replacement of  K and Mg   Discharge Diagnosis:   Principal Problem:   CHF (congestive heart failure) ([place]) Active Problems:   Elevated troponin   Acute respiratory failure with hypoxia (HCC)   Leukocytosis   Thrombocytosis   Atrial fibrillation, chronic (HCC)   Atrial fibrillation with RVR (HCC)   Elevated brain natriuretic peptide (BNP) level    Discharge Condition: Improved.  Diet recommendation: Low sodium, heart healthy.  Carbohydrate-modified.  Wound care: None.  Code status: Full.   History of Present Illness:   Todd [name] is a 65 y.o. male with medical history significant for  essential hypertension, hypothyroidism, type 2 diabetes mellitus, chronic atrial fibrillation, status post bilateral AKA, history of polysubstance abuse (alcohol, tobacco and cocaine) and noncompliance with medication treatment regimen who presents to the emergency department via EMS due to shortness of breath which started few hours prior to arrival to the ED.  EMS was activated, on arrival of EMS team, O2 sat was noted to be 82% on room air.  Patient does not use oxygen at baseline, he denies chest pain, fever, chills, nausea, vomiting. Patient was recently admitted from 11/7 to 11/29 due to similar presentation with CHF right-sided pneumonia as well as COPD exacerbation.   Hospital Course by Problem:   Acute respiratory failure with hypoxia possibly secondary to acute on chronic HFrEF Elevated BNP (753); this was 859 last month  prior to diuresis Continue total input/output, daily weights and fluid restriction  IV Lasix 40 mg twice daily- 2+L diuresis and patient was able to be weaned to room air Continue Cardiac diet             Echocardiogram done on 04/20/2021 showed LVEF of 45-50%.  LV has mildly decreased function.  -will start PO lasix-need close cards and renal follow up -fluid restrict   Doubt CAP POA -pro calcitonin low and patient improved with IV lasix only   Prolonged QTc (505 ms) Avoid QT prolonging drugs goal of 2.0   Chronic kidney injury stage 3B BUN/creatinine 22/1.99 (creatinine within baseline range). CR stable with 2L diuresis with IV Lasix Will need follow up with [facility]   Elevated troponin possibly secondary to type II demand ischemia  patient denies chest pain   Type 2 diabetes mellitus Hemoglobin A1c about 56-month ago was 7.9 Continue home meds  Essential hypertension Continue metoprolol, Lasix  Chronic atrial fibrillation Continue Toprol-XL, Eliquis   GERD Continue Protonix   Mixed hyperlipidemia Continue Lipitor  Polysubstance abuse (alcohol, tobacco and cocaine) Continue folic acid, thiamine, multivitamin   Acquired hypothyroidism Continue Synthroid -follow labs  Anemia of chronic disease Stable; hemoglobin within baseline range CBC 1 week    Medical Consultants:      Discharge Exam:   Vitals:   05/27/21 1954 05/28/21 0544  BP: 119/68 (!) 155/72  Pulse: 63 61  Resp: 20 20  Temp: 98.7 F (37.1 C) 98.1 F (36.7 C)  SpO2: 98% 100%   Vitals:   05/27/21 1530 05/27/21 1700 05/27/21 1954 05/28/21 0544  BP: 111/65 127/76 119/68 (!) 155/72  Pulse: 62 65 63 61  Resp: (!) 21 (!) 21 20 20   Temp:   98.7 F (37.1 C) 98.1 F (36.7 C)  TempSrc:   Oral   SpO2: 91% 96% 98% 100%  Weight:   66.2 kg   Height:        General exam: Appears calm and comfortable.  Lungs without crackles, off O2  The results of significant diagnostics from this  hospitalization (including imaging, microbiology, ancillary and laboratory) are listed below for reference.     Procedures and Diagnostic Studies:   DG Chest Port 1 View  Result Date: 05/27/2021 CLINICAL DATA:  Shortness of breath and hypoxia. EXAM: PORTABLE CHEST 1 VIEW COMPARISON:  Portable chest 05/06/2021. FINDINGS: 4:16 a.m., 05/27/2021. There is mild cardiomegaly, increased central vascular distension. There is interval worsening of widespread left-greater-than-right interstitial and patchy airspace disease with left apical sparing. Findings could be due to edema, pneumonia or combination, with small pleural effusions beginning to form. IMPRESSION: Cardiomegaly with increased central vascular distension and worsening right greater than left interstitial and patchy alveolar opacities, with small pleural effusions forming. Findings consistent with CHF with lung opacities which could suggest alveolar edema, pneumonia or combination. Electronically Signed   By: Telford Nab M.D.   On: 05/27/2021 04:29     Labs:   Basic Metabolic Panel: Recent Labs  Lab 05/27/21 0427 05/28/21 0605  NA 139 142  K 4.0 3.4*  CL 111 110  CO2 17* 20*  GLUCOSE 155* 157*  BUN 22 29*  CREATININE 1.99* 2.07*  CALCIUM 7.8* 7.8*  MG 1.7  --    GFR Estimated Creatinine Clearance: 32.9 mL/min (A) (by C-G formula based on SCr of 2.07 mg/dL (H)). Liver Function Tests: No results for input(s): AST, ALT, ALKPHOS, BILITOT, PROT, ALBUMIN in the last 168 hours. No results for input(s): LIPASE, AMYLASE in the last 168 hours. No results for input(s): AMMONIA in the last 168 hours. Coagulation profile No results for input(s): INR, PROTIME in the last 168 hours.  CBC: Recent Labs  Lab 05/27/21 0427 05/28/21 0605  WBC 15.5* 12.5*  NEUTROABS 11.3*  --   HGB 10.0* 9.2*  HCT 32.4* 29.6*  MCV 90.0 87.3  PLT 669* 617*   Cardiac Enzymes: No results for input(s): CKTOTAL, CKMB, CKMBINDEX, TROPONINI in the last  168 hours. BNP: Invalid input(s): POCBNP CBG: Recent Labs  Lab 05/27/21 1128 05/27/21 1831 05/27/21 2015 05/28/21 0719  GLUCAP 121* 210* 189* 132*   D-Dimer No results for input(s): DDIMER in the last 72 hours. Hgb A1c No results for input(s): HGBA1C in the last 72 hours. Lipid Profile No results for input(s): CHOL, HDL, LDLCALC, TRIG, CHOLHDL, LDLDIRECT in the last 72 hours. Thyroid function studies No results for input(s): TSH, T4TOTAL, T3FREE, THYROIDAB in the last 72 hours.  Invalid input(s): FREET3 Anemia work up No results for input(s): VITAMINB12, FOLATE, FERRITIN, TIBC, IRON, RETICCTPCT in the last 72 hours. Microbiology Recent Results (from the past 240 hour(s))  Resp Panel by RT-PCR (Flu A&B, Covid) Nasopharyngeal Swab     Status: None   Collection Time: 05/27/21  5:54 AM   Specimen: Nasopharyngeal Swab; Nasopharyngeal(NP) swabs in vial transport medium  Result Value Ref Range Status   SARS Coronavirus 2 by RT PCR NEGATIVE NEGATIVE Final    Comment: (NOTE) SARS-CoV-2 target nucleic acids are NOT DETECTED.  The SARS-CoV-2 RNA is generally detectable in upper respiratory specimens during the acute phase of infection. The lowest concentration of SARS-CoV-2 viral copies this  assay can detect is 138 copies/mL. A negative result does not preclude SARS-Cov-2 infection and should not be used as the sole basis for treatment or other patient management decisions. A negative result may occur with  improper specimen collection/handling, submission of specimen other than nasopharyngeal swab, presence of viral mutation(s) within the areas targeted by this assay, and inadequate number of viral copies(<138 copies/mL). A negative result must be combined with clinical observations, patient history, and epidemiological information. The expected result is Negative.  Fact Sheet for Patients:  EntrepreneurPulse.com.au  Fact Sheet for Healthcare Providers:   IncredibleEmployment.be  This test is no t yet approved or cleared by the [place] FDA and  has been authorized for detection and/or diagnosis of SARS-CoV-2 by FDA under an Emergency Use Authorization (EUA). This EUA will remain  in effect (meaning this test can be used) for the duration of the COVID-19 declaration under Section 564(b)(1) of the Act, 21 U.S.C.section 360bbb-3(b)(1), unless the authorization is terminated  or revoked sooner.       Influenza A by PCR NEGATIVE NEGATIVE Final   Influenza B by PCR NEGATIVE NEGATIVE Final    Comment: (NOTE) The Xpert Xpress SARS-CoV-2/FLU/RSV plus assay is intended as an aid in the diagnosis of influenza from Nasopharyngeal swab specimens and should not be used as a sole basis for treatment. Nasal washings and aspirates are unacceptable for Xpert Xpress SARS-CoV-2/FLU/RSV testing.  Fact Sheet for Patients: EntrepreneurPulse.com.au  Fact Sheet for Healthcare Providers: IncredibleEmployment.be  This test is not yet approved or cleared by the [place] FDA and has been authorized for detection and/or diagnosis of SARS-CoV-2 by FDA under an Emergency Use Authorization (EUA). This EUA will remain in effect (meaning this test can be used) for the duration of the COVID-19 declaration under Section 564(b)(1) of the Act, 21 U.S.C. section 360bbb-3(b)(1), unless the authorization is terminated or revoked.  Performed at [facility], [address] [place], [place] [address]      Discharge Instructions:   Discharge Instructions     ([place]) Call MD:  Anytime you have any of the following symptoms: 1) 3 pound weight gain in 24 hours or 5 pounds in 1 week 2) shortness of breath, with or without a dry hacking cough 3) swelling in the hands, feet or stomach 4) if you have to sleep on extra pillows at night in order to breathe.   Complete by: As directed    Diet -  low sodium heart healthy   Complete by: As directed    Diet Carb Modified   Complete by: As directed    Discharge instructions   Complete by: As directed    PRN O2 at night BMP Tuesday for Cr and K Needs close follow up with renal and cardiology   Heart Failure patients record your daily weight using the same scale at the same time of day   Complete by: As directed    Increase activity slowly   Complete by: As directed       Allergies as of 05/28/2021       Reactions   Lisinopril Other (See Comments)   Hyperkalemia / Renal failure        Medication List     STOP taking these medications    colchicine 0.6 MG tablet   silver sulfADIAZINE 1 % cream Commonly known as: SILVADENE       TAKE these medications    acetaminophen 325 MG tablet Commonly known as: TYLENOL Take 2  tablets (650 mg total) by mouth every 6 (six) hours as needed for mild pain (or Fever >/= 101).   allopurinol 100 MG tablet Commonly known as: ZYLOPRIM Take 1 tablet (100 mg total) by mouth daily. What changed: how much to take   amiodarone 200 MG tablet Commonly known as: PACERONE Take 1 tablet (200 mg total) by mouth daily.   apixaban 5 MG Tabs tablet Commonly known as: ELIQUIS Take 1 tablet (5 mg total) by mouth 2 (two) times daily.   atorvastatin 40 MG tablet Commonly known as: LIPITOR TAKE 1 TABLET BY MOUTH ONCE DAILY AT 6 PM What changed:  how much to take how to take this when to take this additional instructions   calcium carbonate 500 MG chewable tablet Commonly known as: TUMS - dosed in mg elemental calcium Chew 2 tablets (400 mg of elemental calcium total) by mouth 2 (two) times daily with a meal. What changed: when to take this   cetirizine 10 MG tablet Commonly known as: ZYRTEC Take 1 tablet (10 mg total) by mouth daily as needed for allergies. For allergy symptoms   cholecalciferol 25 MCG (1000 UNIT) tablet Commonly known as: VITAMIN D3 Take 1,000 Units by mouth  daily.   folic acid 1 MG tablet Commonly known as: FOLVITE Take 1 tablet (1 mg total) by mouth daily.   furosemide 40 MG tablet Commonly known as: LASIX Take 1 tablet (40 mg total) by mouth daily. Start taking on: May 29, 2021   glucose blood test strip Commonly known as: Accu-Chek Aviva Plus Check blood sugars four times a day   hydrALAZINE 25 MG tablet Commonly known as: APRESOLINE Take 1 tablet (25 mg total) by mouth 3 (three) times daily.   insulin lispro 100 UNIT/ML KwikPen Commonly known as: HUMALOG Inject 2-10 Units into the skin. Sliding scale 200-250 2units, 251-300 4units, 301-350 6units, 351-400 8units,401-450 10units   isosorbide mononitrate 30 MG 24 hr tablet Commonly known as: IMDUR Take 1 tablet (30 mg total) by mouth daily.   levothyroxine 100 MCG tablet Commonly known as: SYNTHROID Take 1 tablet (100 mcg total) by mouth daily before breakfast.   LORazepam 0.5 MG tablet Commonly known as: ATIVAN Take 1 tablet (0.5 mg total) by mouth every 6 (six) hours as needed for anxiety.   megestrol 400 MG/10ML suspension Commonly known as: MEGACE Take 10 mLs (400 mg total) by mouth 2 (two) times daily. For appetite stimulation   metoprolol succinate 100 MG 24 hr tablet Commonly known as: TOPROL-XL Take 1 tablet (100 mg total) by mouth daily. Take with or immediately following a meal.   multivitamin with minerals Tabs tablet Take 1 tablet by mouth daily.   pantoprazole 40 MG tablet Commonly known as: PROTONIX Take 1 tablet (40 mg total) by mouth 2 (two) times daily.   polyethylene glycol 17 g packet Commonly known as: MIRALAX / GLYCOLAX Take 17 g by mouth daily.   potassium chloride SA 20 MEQ tablet Commonly known as: KLOR-CON M Take 1 tablet (20 mEq total) by mouth daily. Start taking on: May 29, 2021   PROTEIN PO Take 30 mLs by mouth daily. Liquid protein for low albumin   sertraline 50 MG tablet Commonly known as: ZOLOFT Take 1 tablet  (50 mg total) by mouth at bedtime.   sodium bicarbonate 650 MG tablet Take 650 mg by mouth 3 (three) times daily.   thiamine 100 MG tablet Take 1 tablet (100 mg total) by mouth daily.  Follow-up Information     Minus Breeding, MD Follow up in 1 week(s).   Specialty: Cardiology Contact information: [address] [place] [address] [phone]         Rosita Fire, MD Follow up.   Specialties: Nephrology, Internal Medicine Why: or established nephrology Contact information: [place] [address] [phone]                  Time coordinating discharge: 35 min  Signed:  Geradine Girt DO  [facility] 05/28/2021, 10:44 AM

## 2021-05-28 NOTE — Care Management Important Message (Signed)
Important Message  Patient Details  Name: Todd [name] MRN: [id number] Date of Birth: [birth year]   Medicare Important Message Given:  N/A - LOS <3 / Initial given by admissions     [name] 05/28/2021, 2:06 PM

## 2021-05-29 LAB — GLUCOSE, CAPILLARY: Glucose-Capillary: 262 mg/dL — ABNORMAL HIGH (ref 70–99)

## 2021-05-29 NOTE — Progress Notes (Signed)
EMS arrived to transport patient to facility. Discharge paper work placed in packet for facility.

## 2021-05-31 DIAGNOSIS — E1169 Type 2 diabetes mellitus with other specified complication: Secondary | ICD-10-CM | POA: Diagnosis not present

## 2021-05-31 DIAGNOSIS — I10 Essential (primary) hypertension: Secondary | ICD-10-CM | POA: Diagnosis not present

## 2021-05-31 DIAGNOSIS — Z89611 Acquired absence of right leg above knee: Secondary | ICD-10-CM | POA: Diagnosis not present

## 2021-05-31 DIAGNOSIS — Z794 Long term (current) use of insulin: Secondary | ICD-10-CM | POA: Diagnosis not present

## 2021-05-31 DIAGNOSIS — Z79899 Other long term (current) drug therapy: Secondary | ICD-10-CM | POA: Diagnosis not present

## 2021-05-31 DIAGNOSIS — Z89612 Acquired absence of left leg above knee: Secondary | ICD-10-CM | POA: Diagnosis not present

## 2021-05-31 DIAGNOSIS — I4891 Unspecified atrial fibrillation: Secondary | ICD-10-CM | POA: Diagnosis not present

## 2021-05-31 DIAGNOSIS — E039 Hypothyroidism, unspecified: Secondary | ICD-10-CM | POA: Diagnosis not present

## 2021-06-02 DIAGNOSIS — F1020 Alcohol dependence, uncomplicated: Secondary | ICD-10-CM | POA: Diagnosis not present

## 2021-06-02 DIAGNOSIS — F418 Other specified anxiety disorders: Secondary | ICD-10-CM | POA: Diagnosis not present

## 2021-06-02 DIAGNOSIS — F14222 Cocaine dependence with intoxication with perceptual disturbance: Secondary | ICD-10-CM | POA: Diagnosis not present

## 2021-06-02 DIAGNOSIS — F330 Major depressive disorder, recurrent, mild: Secondary | ICD-10-CM | POA: Diagnosis not present

## 2021-06-02 DIAGNOSIS — F17200 Nicotine dependence, unspecified, uncomplicated: Secondary | ICD-10-CM | POA: Diagnosis not present

## 2021-06-03 ENCOUNTER — Ambulatory Visit (INDEPENDENT_AMBULATORY_CARE_PROVIDER_SITE_OTHER): Payer: Medicare HMO | Admitting: Nurse Practitioner

## 2021-06-03 ENCOUNTER — Encounter: Payer: Self-pay | Admitting: Nurse Practitioner

## 2021-06-03 VITALS — BP 131/67 | HR 67

## 2021-06-03 DIAGNOSIS — E039 Hypothyroidism, unspecified: Secondary | ICD-10-CM | POA: Diagnosis not present

## 2021-06-03 DIAGNOSIS — F17200 Nicotine dependence, unspecified, uncomplicated: Secondary | ICD-10-CM

## 2021-06-03 DIAGNOSIS — E1121 Type 2 diabetes mellitus with diabetic nephropathy: Secondary | ICD-10-CM | POA: Diagnosis not present

## 2021-06-03 DIAGNOSIS — E782 Mixed hyperlipidemia: Secondary | ICD-10-CM

## 2021-06-03 DIAGNOSIS — E559 Vitamin D deficiency, unspecified: Secondary | ICD-10-CM | POA: Diagnosis not present

## 2021-06-03 DIAGNOSIS — Z794 Long term (current) use of insulin: Secondary | ICD-10-CM

## 2021-06-03 DIAGNOSIS — I10 Essential (primary) hypertension: Secondary | ICD-10-CM | POA: Diagnosis not present

## 2021-06-03 MED ORDER — INSULIN LISPRO (1 UNIT DIAL) 100 UNIT/ML (KWIKPEN): 4.0000 [IU] | PEN_INJECTOR | Freq: Three times a day (TID) | SUBCUTANEOUS | 3 refills | Status: DC

## 2021-06-03 MED ORDER — INSULIN GLARGINE 100 UNIT/ML SOLOSTAR PEN: 20.0000 [IU] | PEN_INJECTOR | Freq: Every day | SUBCUTANEOUS | 3 refills | Status: DC

## 2021-06-03 NOTE — Patient Instructions (Signed)

## 2021-06-03 NOTE — Progress Notes (Signed)
06/03/2021, 3:43 PM        Endocrinology follow-up note       Subjective:    Patient ID: Todd [name], male    DOB: 09-28-1955.  Todd [name] is here to follow-up for management of currently uncontrolled symptomatic diabetes requested by  Pcp, No.   Past Medical History:  Diagnosis Date   AKI (acute kidney injury) ([place])    Constipated    Diabetes mellitus without complication ([place])    Diarrhea    Elevated LFTs    Goiter    Gout    Hyperlipidemia    Hypertension    Leukocytosis    Reactive thrombocytosis    Right BKA infection ([place]) 08/2016   Right leg pain    Sepsis due to undetermined organism [facility])    Thyroid disease    Wound infection after surgery 08/2016    Past Surgical History:  Procedure Laterality Date   ABDOMINAL AORTOGRAM N/A 08/11/2016   Procedure: Abdominal Aortogram;  Surgeon: Waynetta Sandy, MD;  Location: [place] [facility];  Service: Cardiovascular;  Laterality: N/A;   ABDOMINAL AORTOGRAM W/LOWER EXTREMITY N/A 08/15/2016   Procedure: Abdominal Aortogram w/Lower Extremity;  Surgeon: Elam Dutch, MD;  Location: [place] [facility];  Service: Cardiovascular;  Laterality: N/A;   AMPUTATION Right 08/17/2016   Procedure: RIGHT BELOW KNEE AMPUTATION;  Surgeon: Elam Dutch, MD;  Location: [place];  Service: Vascular;  Laterality: Right;   AMPUTATION Right 09/12/2016   Procedure: AMPUTATION ABOVE KNEE;  Surgeon: Newt Minion, MD;  Location: [place];  Service: Orthopedics;  Laterality: Right;   AMPUTATION Left 08/12/2016   Procedure: LEFT BELOW KNEE AMPUTATION;  Surgeon: Newt Minion, MD;  Location: [place];  Service: Orthopedics;  Laterality: Left;   AMPUTATION Left 11/01/2017   Procedure: LEFT ABOVE KNEE AMPUTATION;  Surgeon: Newt Minion, MD;  Location: [place];  Service: Orthopedics;  Laterality: Left;   APPLICATION OF WOUND VAC Right 09/12/2016   Procedure: APPLICATION OF WOUND VAC  ABOVE KNEE;  Surgeon: Newt Minion, MD;  Location: [place];  Service: Orthopedics;  Laterality: Right;   BIOPSY  01/13/2021   Procedure: BIOPSY;  Surgeon: Rogene Houston, MD;  [facility];  Service: Endoscopy;;  esophageal   COLONOSCOPY WITH PROPOFOL N/A 01/13/2021   Procedure: COLONOSCOPY WITH PROPOFOL;  Surgeon: Rogene Houston, MD;  [facility];  Service: Endoscopy;  Laterality: N/A;   ESOPHAGOGASTRODUODENOSCOPY (EGD) WITH PROPOFOL N/A 01/13/2021   Procedure: ESOPHAGOGASTRODUODENOSCOPY (EGD) WITH PROPOFOL;  Surgeon: Rogene Houston, MD;  [facility];  Service: Endoscopy;  Laterality: N/A;   LOWER EXTREMITY ANGIOGRAPHY Bilateral 08/11/2016   Procedure: Lower Extremity Angiography;  Surgeon: Waynetta Sandy, MD;  Location: [place] [facility];  Service: Cardiovascular;  Laterality: Bilateral;   PERIPHERAL VASCULAR BALLOON ANGIOPLASTY Left 08/11/2016   Procedure: Peripheral Vascular Balloon Angioplasty;  Surgeon: Waynetta Sandy, MD;  Location: [place] [facility];  Service: Cardiovascular;  Laterality: Left;  SFA   POLYPECTOMY  01/13/2021   Procedure: POLYPECTOMY;  Surgeon: Rogene Houston, MD;  [facility];  Service: Endoscopy;;   THYROID SURGERY      Social History   Socioeconomic History  Marital status: Single    Spouse name: Not on file   Number of children: Not on file   Years of education: Not on file   Highest education level: Not on file  Occupational History   Occupation: retired    Comment: drove a Medical illustrator  Tobacco Use   Smoking status: Every Day    Packs/day: 0.25    Years: 45.00    Pack years: 11.25    Types: Cigarettes    Last attempt to quit: 11/12/2014    Years since quitting: 6.5   Smokeless tobacco: Never  Vaping Use   Vaping Use: Never used  Substance and Sexual Activity   Alcohol use: Yes    Alcohol/week: 2.0 standard drinks    Types: 1 Glasses of wine, 1 Cans of beer per week   Drug use: No    Sexual activity: Not Currently  Other Topics Concern   Not on file  Social History Narrative   09/23/20 - Lives alone, uses a wheelchair, double amputee, not married, no children - HHA comes in 3x per week to help him bathe and set up weekly meds.   Social Determinants of Health   Financial Resource Strain: Low Risk    Difficulty of Paying Living Expenses: Not very hard  Food Insecurity: No Food Insecurity   Worried About Charity fundraiser in the Last Year: Never true   Ran Out of Food in the Last Year: Never true  Transportation Needs: No Transportation Needs   Lack of Transportation (Medical): No   Lack of Transportation (Non-Medical): No  Physical Activity: Inactive   Days of Exercise per Week: 0 days   Minutes of Exercise per Session: 0 min  Stress: No Stress Concern Present   Feeling of Stress : Only a little  Social Connections: Socially Isolated   Frequency of Communication with Friends and Family: More than three times a week   Frequency of Social Gatherings with Friends and Family: More than three times a week   Attends Religious Services: Never   Marine scientist or Organizations: No   Attends Music therapist: Never   Marital Status: Never married    Family History  Problem Relation Age of Onset   Heart disease Mother    Pneumonia Father    Diabetes Maternal Aunt    Diabetes Maternal Uncle     Outpatient Encounter Medications as of 06/03/2021  Medication Sig   acetaminophen (TYLENOL) 325 MG tablet Take 2 tablets (650 mg total) by mouth every 6 (six) hours as needed for mild pain (or Fever >/= 101).   allopurinol (ZYLOPRIM) 100 MG tablet Take 1 tablet (100 mg total) by mouth daily. (Patient taking differently: Take 300 mg by mouth daily.)   amiodarone (PACERONE) 200 MG tablet Take 1 tablet (200 mg total) by mouth daily.   amiodarone (PACERONE) 400 MG tablet Take 400 mg by mouth daily.   apixaban (ELIQUIS) 5 MG TABS tablet Take 1 tablet (5 mg  total) by mouth 2 (two) times daily.   atorvastatin (LIPITOR) 40 MG tablet TAKE 1 TABLET BY MOUTH ONCE DAILY AT 6 PM (Patient taking differently: Take 40 mg by mouth daily.)   calcium carbonate (TUMS - DOSED IN MG ELEMENTAL CALCIUM) 500 MG chewable tablet Chew 2 tablets (400 mg of elemental calcium total) by mouth 2 (two) times daily with a meal. (Patient taking differently: Chew 2 tablets by mouth 3 (three) times daily with meals.)   cetirizine (ZYRTEC) 10  MG tablet Take 1 tablet (10 mg total) by mouth daily as needed for allergies. For allergy symptoms   cholecalciferol (VITAMIN D3) 25 MCG (1000 UNIT) tablet Take 1,000 Units by mouth daily.   folic acid (FOLVITE) 1 MG tablet Take 1 tablet (1 mg total) by mouth daily.   furosemide (LASIX) 40 MG tablet Take 1 tablet (40 mg total) by mouth daily.   glucose blood (ACCU-CHEK AVIVA PLUS) test strip Check blood sugars four times a day   hydrALAZINE (APRESOLINE) 25 MG tablet Take 1 tablet (25 mg total) by mouth 3 (three) times daily.   insulin glargine (LANTUS) 100 UNIT/ML Solostar Pen Inject 20 Units into the skin at bedtime.   isosorbide mononitrate (IMDUR) 30 MG 24 hr tablet Take 1 tablet (30 mg total) by mouth daily.   levothyroxine (SYNTHROID) 100 MCG tablet Take 1 tablet (100 mcg total) by mouth daily before breakfast.   LORazepam (ATIVAN) 0.5 MG tablet Take 1 tablet (0.5 mg total) by mouth every 6 (six) hours as needed for anxiety.   megestrol (MEGACE) 400 MG/10ML suspension Take 10 mLs (400 mg total) by mouth 2 (two) times daily. For appetite stimulation   metoprolol succinate (TOPROL-XL) 100 MG 24 hr tablet Take 1 tablet (100 mg total) by mouth daily. Take with or immediately following a meal.   Multiple Vitamin (MULTIVITAMIN WITH MINERALS) TABS tablet Take 1 tablet by mouth daily.   pantoprazole (PROTONIX) 40 MG tablet Take 1 tablet (40 mg total) by mouth 2 (two) times daily.   polyethylene glycol (MIRALAX / GLYCOLAX) 17 g packet Take 17 g by  mouth daily.   potassium chloride SA (KLOR-CON M) 20 MEQ tablet Take 1 tablet (20 mEq total) by mouth daily.   PROTEIN PO Take 30 mLs by mouth daily. Liquid protein for low albumin   sertraline (ZOLOFT) 50 MG tablet Take 1 tablet (50 mg total) by mouth at bedtime.   sodium bicarbonate 650 MG tablet Take 650 mg by mouth 3 (three) times daily.   thiamine 100 MG tablet Take 1 tablet (100 mg total) by mouth daily.   triamcinolone 0.1%-silver sulfadiazine 1:1 cream mixture Apply topically as needed.   [DISCONTINUED] insulin lispro (HUMALOG) 100 UNIT/ML KwikPen Inject 2-10 Units into the skin. Sliding scale 200-250 2units, 251-300 4units, 301-350 6units, 351-400 8units,401-450 10units   insulin lispro (HUMALOG) 100 UNIT/ML KwikPen Inject 4-10 Units into the skin 3 (three) times daily. Sliding scale 200-250 2units, 251-300 4units, 301-350 6units, 351-400 8units,401-450 10units   No facility-administered encounter medications on file as of 06/03/2021.    ALLERGIES: Allergies  Allergen Reactions   Lisinopril Other (See Comments)    Hyperkalemia / Renal failure    VACCINATION STATUS: Immunization History  Administered Date(s) Administered   Influenza,inj,Quad PF,6+ Mos 05/20/2015, 04/07/2017, 03/09/2018, 05/01/2019   Influenza-Unspecified 05/10/2011   PFIZER Comirnaty(Gray Top)Covid-19 Tri-Sucrose Vaccine 07/28/2020, 08/18/2020, 08/18/2020   PPD Test 09/16/2016    Diabetes He presents for his follow-up diabetic visit. He has type 2 diabetes mellitus. Onset time: Patient was diagnosed with A1c of greater than 14% in 2017 at the age of 53 years. His disease course has been fluctuating. Pertinent negatives for hypoglycemia include no confusion, headaches, nervousness/anxiousness, pallor, seizures or tremors. Pertinent negatives for diabetes include no chest pain, no fatigue, no polydipsia, no polyphagia, no polyuria and no weakness. There are no hypoglycemic complications. Symptoms are stable.  Diabetic complications include nephropathy and PVD. Risk factors for coronary artery disease include dyslipidemia, diabetes mellitus, male sex, sedentary  lifestyle, tobacco exposure and hypertension. Current diabetic treatment includes intensive insulin program. He is compliant with treatment most of the time (now in SNF). His weight is decreasing steadily. He is following a generally unhealthy diet. When asked about meal planning, he reported none. He has not had a previous visit with a dietitian. He never participates in exercise. His breakfast blood glucose range is generally 140-180 mg/dl. His lunch blood glucose range is generally >200 mg/dl. His dinner blood glucose range is generally >200 mg/dl. His bedtime blood glucose range is generally >200 mg/dl. (He presents today, accompanied by care attendant from the nursing home, with his logs showing fluctuating but mostly high glycemic profile overall.  His previsit A1c, checked during a recent hospitalization for CHF exacerbation was 7.9% on 11/8.  He has only been on Humalog SSI for diabetes management (somehow during his admission to the nursing home, his basal insulin got dropped?).) An ACE inhibitor/angiotensin II receptor blocker is not being taken. He does not see a podiatrist.Eye exam is current.  Hyperlipidemia This is a chronic problem. The current episode started more than 1 year ago. The problem is controlled. Recent lipid tests were reviewed and are normal. Exacerbating diseases include chronic renal disease, diabetes and hypothyroidism. Factors aggravating his hyperlipidemia include beta blockers. Pertinent negatives include no chest pain, myalgias or shortness of breath. Current antihyperlipidemic treatment includes statins. The current treatment provides mild improvement of lipids. Compliance problems include adherence to diet and adherence to exercise.  Risk factors for coronary artery disease include diabetes mellitus, dyslipidemia, a sedentary  lifestyle, male sex and hypertension.  Thyroid Problem Presents for follow-up visit. Onset time: He reports thyroidectomy in 2013 due to benign nodular goiter. Patient reports no anxiety, cold intolerance, constipation, depressed mood, diarrhea, fatigue, heat intolerance, palpitations or tremors. The symptoms have been stable. The treatment provided moderate relief. Prior procedures include thyroidectomy. His past medical history is significant for diabetes and hyperlipidemia.    Review of systems  Constitutional: + Minimally fluctuating body weight,  current There is no height or weight on file to calculate BMI. , no fatigue, no subjective hyperthermia, no subjective hypothermia Eyes: no blurry vision, no xerophthalmia ENT: no sore throat, no nodules palpated in throat, no dysphagia/odynophagia, no hoarseness Cardiovascular: no chest pain, no shortness of breath, no palpitations, no leg swelling Respiratory: no cough, no shortness of breath Gastrointestinal: no nausea/vomiting/diarrhea Musculoskeletal: no muscle/joint aches, hx of bilateral AKA- WC bound Skin: no rashes, no hyperemia Neurological: no tremors, no numbness, no tingling, no dizziness Psychiatric: no depression, no anxiety  Objective:    BP 131/67    Pulse 67    SpO2 100%   Wt Readings from Last 3 Encounters:  05/29/21 146 lb 9.7 oz (66.5 kg)  05/24/21 130 lb (59 kg)  05/11/21 132 lb 11.5 oz (60.2 kg)    BP Readings from Last 3 Encounters:  06/03/21 131/67  05/29/21 (!) 169/78  05/24/21 122/60    Review of systems  Constitutional: + Minimally fluctuating body weight,  current There is no height or weight on file to calculate BMI. , no fatigue, no subjective hyperthermia, no subjective hypothermia Eyes: no blurry vision, no xerophthalmia ENT: no sore throat, no nodules palpated in throat, no dysphagia/odynophagia, no hoarseness Cardiovascular: no chest pain, no shortness of breath, no palpitations, no leg  swelling Respiratory: no cough, no shortness of breath Gastrointestinal: no nausea/vomiting/diarrhea Musculoskeletal: no muscle/joint aches, hx of bilateral AKA- WC bound Skin: no rashes, no hyperemia, nicotinic discoloration to  fingernails bilaterally Neurological: no tremors, no numbness, no tingling, no dizziness Psychiatric: no depression, no anxiety  Diabetic Labs (most recent): Lab Results  Component Value Date   HGBA1C 7.9 (H) 04/20/2021   HGBA1C 6.0 (H) 12/04/2020   HGBA1C 6.0 (H) 08/06/2020     Lipid Panel ( most recent) Lipid Panel     Component Value Date/Time   CHOL 59 (L) 09/22/2020 0923   TRIG 60 09/22/2020 0923   HDL 25 (L) 09/22/2020 0923   CHOLHDL 2.4 09/22/2020 0923   LDLCALC 19 09/22/2020 0923     Recent Results (from the past 2160 hour(s))  Resp Panel by RT-PCR (Flu A&B, Covid) Nasopharyngeal Swab     Status: None   Collection Time: 04/19/21 12:12 AM   Specimen: Nasopharyngeal Swab; Nasopharyngeal(NP) swabs in vial transport medium  Result Value Ref Range   SARS Coronavirus 2 by RT PCR NEGATIVE NEGATIVE    Comment: (NOTE) SARS-CoV-2 target nucleic acids are NOT DETECTED.  The SARS-CoV-2 RNA is generally detectable in upper respiratory specimens during the acute phase of infection. The lowest concentration of SARS-CoV-2 viral copies this assay can detect is 138 copies/mL. A negative result does not preclude SARS-Cov-2 infection and should not be used as the sole basis for treatment or other patient management decisions. A negative result may occur with  improper specimen collection/handling, submission of specimen other than nasopharyngeal swab, presence of viral mutation(s) within the areas targeted by this assay, and inadequate number of viral copies(<138 copies/mL). A negative result must be combined with clinical observations, patient history, and epidemiological information. The expected result is Negative.  Fact Sheet for Patients:   EntrepreneurPulse.com.au  Fact Sheet for Healthcare Providers:  IncredibleEmployment.be  This test is no t yet approved or cleared by the [place] FDA and  has been authorized for detection and/or diagnosis of SARS-CoV-2 by FDA under an Emergency Use Authorization (EUA). This EUA will remain  in effect (meaning this test can be used) for the duration of the COVID-19 declaration under Section 564(b)(1) of the Act, 21 U.S.C.section 360bbb-3(b)(1), unless the authorization is terminated  or revoked sooner.       Influenza A by PCR NEGATIVE NEGATIVE   Influenza B by PCR NEGATIVE NEGATIVE    Comment: (NOTE) The Xpert Xpress SARS-CoV-2/FLU/RSV plus assay is intended as an aid in the diagnosis of influenza from Nasopharyngeal swab specimens and should not be used as a sole basis for treatment. Nasal washings and aspirates are unacceptable for Xpert Xpress SARS-CoV-2/FLU/RSV testing.  Fact Sheet for Patients: EntrepreneurPulse.com.au  Fact Sheet for Healthcare Providers: IncredibleEmployment.be  This test is not yet approved or cleared by the [place] FDA and has been authorized for detection and/or diagnosis of SARS-CoV-2 by FDA under an Emergency Use Authorization (EUA). This EUA will remain in effect (meaning this test can be used) for the duration of the COVID-19 declaration under Section 564(b)(1) of the Act, 21 U.S.C. section 360bbb-3(b)(1), unless the authorization is terminated or revoked.  Performed at [facility], [address], [place], [place] [address]   CBC with Differential/Platelet     Status: Abnormal   Collection Time: 04/19/21 12:19 AM  Result Value Ref Range   WBC 17.5 (H) 4.0 - 10.5 K/uL   RBC 3.94 (L) 4.22 - 5.81 MIL/uL   Hemoglobin 11.3 (L) 13.0 - 17.0 g/dL   HCT 35.3 (L) 39.0 - 52.0 %   MCV 89.6 80.0 - 100.0 fL   MCH 28.7 26.0 - 34.0 pg  MCHC 32.0 30.0 - 36.0 g/dL    RDW 16.7 (H) 11.5 - 15.5 %   Platelets 725 (H) 150 - 400 K/uL   nRBC 0.0 0.0 - 0.2 %   Neutrophils Relative % 78 %   Neutro Abs 13.6 (H) 1.7 - 7.7 K/uL   Lymphocytes Relative 14 %   Lymphs Abs 2.4 0.7 - 4.0 K/uL   Monocytes Relative 6 %   Monocytes Absolute 1.0 0.1 - 1.0 K/uL   Eosinophils Relative 0 %   Eosinophils Absolute 0.1 0.0 - 0.5 K/uL   Basophils Relative 1 %   Basophils Absolute 0.1 0.0 - 0.1 K/uL   Immature Granulocytes 1 %   Abs Immature Granulocytes 0.24 (H) 0.00 - 0.07 K/uL    Comment: Performed at [facility], [address] [place], [place] [address]  Troponin I (High Sensitivity)     Status: Abnormal   Collection Time: 04/19/21 12:19 AM  Result Value Ref Range   Troponin I (High Sensitivity) 52 (H) <18 ng/L    Comment: (NOTE) Elevated high sensitivity troponin I (hsTnI) values and significant  changes across serial measurements may suggest ACS but many other  chronic and acute conditions are known to elevate hsTnI results.  Refer to the "Links" section for chest pain algorithms and additional  guidance. Performed at [facility], [address], [place], [place] [address]   Brain natriuretic peptide     Status: Abnormal   Collection Time: 04/19/21 12:19 AM  Result Value Ref Range   B Natriuretic Peptide 859.0 (H) 0.0 - 100.0 pg/mL    Comment: Performed at [facility], [address] [place], [place] [address]  Comprehensive metabolic panel     Status: Abnormal   Collection Time: 04/19/21 12:19 AM  Result Value Ref Range   Sodium 141 135 - 145 mmol/L   Potassium 4.0 3.5 - 5.1 mmol/L   Chloride 115 (H) 98 - 111 mmol/L   CO2 18 (L) 22 - 32 mmol/L   Glucose, Bld 174 (H) 70 - 99 mg/dL    Comment: Glucose reference range applies only to samples taken after fasting for at least 8 hours.   BUN 26 (H) 8 - 23 mg/dL   Creatinine, Ser 1.89 (H) 0.61 - 1.24 mg/dL   Calcium 7.9 (L) 8.9 - 10.3 mg/dL   Total Protein 7.2 6.5 - 8.1 g/dL   Albumin 2.6 (L) 3.5 - 5.0 g/dL    AST 19 15 - 41 U/L   ALT 21 0 - 44 U/L   Alkaline Phosphatase 136 (H) 38 - 126 U/L   Total Bilirubin 0.5 0.3 - 1.2 mg/dL   GFR, Estimated 39 (L) >60 mL/min    Comment: (NOTE) Calculated using the CKD-EPI Creatinine Equation (2021)    Anion gap 8 5 - 15    Comment: Performed at [facility], [address] [place], [place] [address]  Culture, blood (Routine X 2) w Reflex to ID Panel     Status: None   Collection Time: 04/20/21 12:52 AM   Specimen: BLOOD  Result Value Ref Range   Specimen Description BLOOD LEFT ANTECUBITAL    Special Requests      BOTTLES DRAWN AEROBIC AND ANAEROBIC Blood Culture adequate volume   Culture      NO GROWTH 5 DAYS Performed at [facility], [address] [place], [place] [address]    Report Status 04/25/2021 FINAL   Culture, blood (Routine X 2) w Reflex to ID Panel  Status: None   Collection Time: 04/20/21 12:52 AM   Specimen: BLOOD  Result Value Ref Range   Specimen Description BLOOD BLOOD LEFT HAND    Special Requests      BOTTLES DRAWN AEROBIC AND ANAEROBIC Blood Culture adequate volume   Culture      NO GROWTH 5 DAYS Performed at [facility], [address] [place], [place] [address]    Report Status 04/25/2021 FINAL   Lactic acid, plasma     Status: None   Collection Time: 04/20/21 12:52 AM  Result Value Ref Range   Lactic Acid, Venous 1.7 0.5 - 1.9 mmol/L    Comment: Performed at [facility], [address] [place], [place] [address]  Troponin I (High Sensitivity)     Status: Abnormal   Collection Time: 04/20/21  2:54 AM  Result Value Ref Range   Troponin I (High Sensitivity) 55 (H) <18 ng/L    Comment: (NOTE) Elevated high sensitivity troponin I (hsTnI) values and significant  changes across serial measurements may suggest ACS but many other  chronic and acute conditions are known to elevate hsTnI results.  Refer to the "Links" section for chest pain algorithms and additional  guidance. Performed at [facility], [address], [place], [place] [address]   Protime-INR     Status: Abnormal   Collection Time: 04/20/21  2:54 AM  Result Value Ref Range   Prothrombin Time 16.7 (H) 11.4 - 15.2 seconds   INR 1.4 (H) 0.8 - 1.2    Comment: (NOTE) INR goal varies based on device and disease states. Performed at [facility], [address] [place], [place] [address]   APTT     Status: None   Collection Time: 04/20/21  2:54 AM  Result Value Ref Range   aPTT 31 24 - 36 seconds    Comment: Performed at [facility], [address] [place], [place] [address]  Magnesium     Status: Abnormal   Collection Time: 04/20/21  2:54 AM  Result Value Ref Range   Magnesium 1.6 (L) 1.7 - 2.4 mg/dL    Comment: Performed at [facility], [address], [place], [place] [address]  Phosphorus     Status: None   Collection Time: 04/20/21  2:54 AM  Result Value Ref Range   Phosphorus 3.8 2.5 - 4.6 mg/dL    Comment: Performed at [facility], [address] [place], [place] [address]  Procalcitonin - Baseline     Status: None   Collection Time: 04/20/21  2:54 AM  Result Value Ref Range   Procalcitonin 0.10 ng/mL    Comment:        Interpretation: PCT (Procalcitonin) <= 0.5 ng/mL: Systemic infection (sepsis) is not likely. Local bacterial infection is possible. (NOTE)       Sepsis PCT Algorithm           Lower Respiratory Tract                                      Infection PCT Algorithm    ----------------------------     ----------------------------         PCT < 0.25 ng/mL                PCT < 0.10 ng/mL          Strongly encourage  Strongly discourage   discontinuation of antibiotics    initiation of antibiotics    ----------------------------     -----------------------------       PCT 0.25 - 0.50 ng/mL            PCT 0.10 - 0.25 ng/mL               OR       >80% decrease in PCT            Discourage initiation of                                            antibiotics      Encourage discontinuation            of antibiotics    ----------------------------     -----------------------------         PCT >= 0.50 ng/mL              PCT 0.26 - 0.50 ng/mL               AND        <80% decrease in PCT             Encourage initiation of                                             antibiotics       Encourage continuation           of antibiotics    ----------------------------     -----------------------------        PCT >= 0.50 ng/mL                  PCT > 0.50 ng/mL               AND         increase in PCT                  Strongly encourage                                      initiation of antibiotics    Strongly encourage escalation           of antibiotics                                     -----------------------------                                           PCT <= 0.25 ng/mL                                                 OR                                        >  80% decrease in PCT                                      Discontinue / Do not initiate                                             antibiotics  Performed at [facility], [address], [place], [place] [address]   Troponin I (High Sensitivity)     Status: Abnormal   Collection Time: 04/20/21  5:02 AM  Result Value Ref Range   Troponin I (High Sensitivity) 55 (H) <18 ng/L    Comment: (NOTE) Elevated high sensitivity troponin I (hsTnI) values and significant  changes across serial measurements may suggest ACS but many other  chronic and acute conditions are known to elevate hsTnI results.  Refer to the "Links" section for chest pain algorithms and additional  guidance. Performed at [facility], [address], [place], [place] [address]   Troponin I (High Sensitivity)     Status: Abnormal   Collection Time: 04/20/21  6:39 AM  Result Value Ref Range   Troponin I (High Sensitivity) 56 (H) <18 ng/L    Comment: (NOTE) Elevated high sensitivity troponin I (hsTnI) values and significant  changes across serial  measurements may suggest ACS but many other  chronic and acute conditions are known to elevate hsTnI results.  Refer to the "Links" section for chest pain algorithms and additional  guidance. Performed at [facility], [address] [place] [address] [place], [place] [address]   CBG monitoring, ED     Status: Abnormal   Collection Time: 04/20/21  7:10 AM  Result Value Ref Range   Glucose-Capillary 133 (H) 70 - 99 mg/dL    Comment: Glucose reference range applies only to samples taken after fasting for at least 8 hours.  ECHOCARDIOGRAM LIMITED     Status: None   Collection Time: 04/20/21  9:28 AM  Result Value Ref Range   Weight 2,081.14 oz   Height 68 in   BP 154/81 mmHg   S' Lateral 4.10 cm  Legionella Pneumophila Serogp 1 Ur Ag     Status: None   Collection Time: 04/20/21 10:10 AM  Result Value Ref Range   L. pneumophila Serogp 1 Ur Ag Negative Negative    Comment: (NOTE) Presumptive negative for L. pneumophila serogroup 1 antigen in urine, suggesting no recent or current infection. Legionnaires' disease cannot be ruled out since other serogroups and species may also cause disease. Performed At: [facility] [place], [place] [id number] Rush Farmer MD PZ:[id number]    Source of Sample URINE, CLEAN CATCH     Comment: Performed at [facility], [address] [place], [place] [address]  Strep pneumoniae urinary antigen     Status: None   Collection Time: 04/20/21 10:10 AM  Result Value Ref Range   Strep Pneumo Urinary Antigen NEGATIVE NEGATIVE    Comment:        Infection due to S. pneumoniae cannot be absolutely ruled out since the antigen present may be below the detection limit of the test. Performed at [facility], [address], [place], [place] [address]   Glucose, capillary     Status: Abnormal   Collection Time: 04/20/21 11:16 AM  Result Value  Ref Range   Glucose-Capillary 146 (H) 70 - 99 mg/dL    Comment: Glucose reference range applies only  to samples taken after fasting for at least 8 hours.  CBC     Status: Abnormal   Collection Time: 04/20/21  3:07 PM  Result Value Ref Range   WBC 16.3 (H) 4.0 - 10.5 K/uL   RBC 3.73 (L) 4.22 - 5.81 MIL/uL   Hemoglobin 10.7 (L) 13.0 - 17.0 g/dL   HCT 32.4 (L) 39.0 - 52.0 %   MCV 86.9 80.0 - 100.0 fL   MCH 28.7 26.0 - 34.0 pg   MCHC 33.0 30.0 - 36.0 g/dL   RDW 16.4 (H) 11.5 - 15.5 %   Platelets 657 (H) 150 - 400 K/uL   nRBC 0.0 0.0 - 0.2 %    Comment: Performed at [facility], [address], [place], [place] [address]  Comprehensive metabolic panel     Status: Abnormal   Collection Time: 04/20/21  3:07 PM  Result Value Ref Range   Sodium 140 135 - 145 mmol/L   Potassium 3.9 3.5 - 5.1 mmol/L   Chloride 116 (H) 98 - 111 mmol/L   CO2 16 (L) 22 - 32 mmol/L   Glucose, Bld 153 (H) 70 - 99 mg/dL    Comment: Glucose reference range applies only to samples taken after fasting for at least 8 hours.   BUN 26 (H) 8 - 23 mg/dL   Creatinine, Ser 1.69 (H) 0.61 - 1.24 mg/dL   Calcium 7.9 (L) 8.9 - 10.3 mg/dL   Total Protein 6.5 6.5 - 8.1 g/dL   Albumin 2.4 (L) 3.5 - 5.0 g/dL   AST 17 15 - 41 U/L   ALT 18 0 - 44 U/L   Alkaline Phosphatase 100 38 - 126 U/L   Total Bilirubin 0.7 0.3 - 1.2 mg/dL   GFR, Estimated 44 (L) >60 mL/min    Comment: (NOTE) Calculated using the CKD-EPI Creatinine Equation (2021)    Anion gap 8 5 - 15    Comment: Performed at [facility], [address] [place], [place] [address]  Hemoglobin A1c     Status: Abnormal   Collection Time: 04/20/21  3:07 PM  Result Value Ref Range   Hgb A1c MFr Bld 7.9 (H) 4.8 - 5.6 %    Comment: (NOTE) Pre diabetes:          5.7%-6.4%  Diabetes:              >6.4%  Glycemic control for   <7.0% adults with diabetes    Mean Plasma Glucose 180.03 mg/dL    Comment: Performed at [facility], [place] [address], [place], [place] [address]  Glucose, capillary     Status: Abnormal   Collection Time: 04/20/21  4:34 PM  Result Value  Ref Range   Glucose-Capillary 166 (H) 70 - 99 mg/dL    Comment: Glucose reference range applies only to samples taken after fasting for at least 8 hours.  Glucose, capillary     Status: Abnormal   Collection Time: 04/20/21  8:36 PM  Result Value Ref Range   Glucose-Capillary 243 (H) 70 - 99 mg/dL    Comment: Glucose reference range applies only to samples taken after fasting for at least 8 hours.  Magnesium     Status: Abnormal   Collection Time: 04/21/21  3:27 AM  Result Value Ref Range   Magnesium 2.5 (H) 1.7 - 2.4 mg/dL    Comment:  Performed at [facility], [place] [address] [place], [place] [address]  Phosphorus     Status: Abnormal   Collection Time: 04/21/21  3:27 AM  Result Value Ref Range   Phosphorus 5.1 (H) 2.5 - 4.6 mg/dL    Comment: Performed at [facility] [address], [place], [place] [address]  Glucose, capillary     Status: Abnormal   Collection Time: 04/21/21  7:40 AM  Result Value Ref Range   Glucose-Capillary 348 (H) 70 - 99 mg/dL    Comment: Glucose reference range applies only to samples taken after fasting for at least 8 hours.  Glucose, capillary     Status: Abnormal   Collection Time: 04/21/21 11:33 AM  Result Value Ref Range   Glucose-Capillary 316 (H) 70 - 99 mg/dL    Comment: Glucose reference range applies only to samples taken after fasting for at least 8 hours.  Glucose, capillary     Status: Abnormal   Collection Time: 04/21/21  4:19 PM  Result Value Ref Range   Glucose-Capillary 409 (H) 70 - 99 mg/dL    Comment: Glucose reference range applies only to samples taken after fasting for at least 8 hours.  Glucose, capillary     Status: Abnormal   Collection Time: 04/21/21  9:37 PM  Result Value Ref Range   Glucose-Capillary 410 (H) 70 - 99 mg/dL    Comment: Glucose reference range applies only to samples taken after fasting for at least 8 hours.  Glucose, capillary     Status: Abnormal   Collection Time: 04/21/21 11:01 PM  Result  Value Ref Range   Glucose-Capillary 361 (H) 70 - 99 mg/dL    Comment: Glucose reference range applies only to samples taken after fasting for at least 8 hours.  CBC     Status: Abnormal   Collection Time: 04/22/21  2:07 AM  Result Value Ref Range   WBC 22.1 (H) 4.0 - 10.5 K/uL   RBC 3.66 (L) 4.22 - 5.81 MIL/uL   Hemoglobin 10.1 (L) 13.0 - 17.0 g/dL   HCT 32.1 (L) 39.0 - 52.0 %   MCV 87.7 80.0 - 100.0 fL   MCH 27.6 26.0 - 34.0 pg   MCHC 31.5 30.0 - 36.0 g/dL   RDW 16.3 (H) 11.5 - 15.5 %   Platelets 677 (H) 150 - 400 K/uL   nRBC 0.0 0.0 - 0.2 %    Comment: Performed at [facility] [address], [place], [place] [address]  Comprehensive metabolic panel     Status: Abnormal   Collection Time: 04/22/21  2:07 AM  Result Value Ref Range   Sodium 136 135 - 145 mmol/L   Potassium 4.6 3.5 - 5.1 mmol/L   Chloride 111 98 - 111 mmol/L   CO2 14 (L) 22 - 32 mmol/L   Glucose, Bld 384 (H) 70 - 99 mg/dL    Comment: Glucose reference range applies only to samples taken after fasting for at least 8 hours.   BUN 43 (H) 8 - 23 mg/dL   Creatinine, Ser 2.25 (H) 0.61 - 1.24 mg/dL   Calcium 7.9 (L) 8.9 - 10.3 mg/dL   Total Protein 6.7 6.5 - 8.1 g/dL   Albumin 2.4 (L) 3.5 - 5.0 g/dL   AST 16 15 - 41 U/L   ALT 17 0 - 44 U/L   Alkaline Phosphatase 124 38 - 126 U/L   Total Bilirubin 0.5 0.3 - 1.2 mg/dL  GFR, Estimated 32 (L) >60 mL/min    Comment: (NOTE) Calculated using the CKD-EPI Creatinine Equation (2021)    Anion gap 11 5 - 15    Comment: Performed at [facility], [place] [address], [place], [place] [address]  TSH     Status: Abnormal   Collection Time: 04/22/21  2:07 AM  Result Value Ref Range   TSH 0.315 (L) 0.350 - 4.500 uIU/mL    Comment: Performed by a 3rd Generation assay with a functional sensitivity of <=0.01 uIU/mL. Performed at [facility], [place] [address], [place], [place] [address]   Procalcitonin - Baseline     Status: None   Collection Time: 04/22/21  2:07  AM  Result Value Ref Range   Procalcitonin 0.13 ng/mL    Comment:        Interpretation: PCT (Procalcitonin) <= 0.5 ng/mL: Systemic infection (sepsis) is not likely. Local bacterial infection is possible. (NOTE)       Sepsis PCT Algorithm           Lower Respiratory Tract                                      Infection PCT Algorithm    ----------------------------     ----------------------------         PCT < 0.25 ng/mL                PCT < 0.10 ng/mL          Strongly encourage             Strongly discourage   discontinuation of antibiotics    initiation of antibiotics    ----------------------------     -----------------------------       PCT 0.25 - 0.50 ng/mL            PCT 0.10 - 0.25 ng/mL               OR       >80% decrease in PCT            Discourage initiation of                                            antibiotics      Encourage discontinuation           of antibiotics    ----------------------------     -----------------------------         PCT >= 0.50 ng/mL              PCT 0.26 - 0.50 ng/mL               AND        <80% decrease in PCT             Encourage initiation of                                             antibiotics       Encourage continuation           of antibiotics    ----------------------------     -----------------------------  PCT >= 0.50 ng/mL                  PCT > 0.50 ng/mL               AND         increase in PCT                  Strongly encourage                                      initiation of antibiotics    Strongly encourage escalation           of antibiotics                                     -----------------------------                                           PCT <= 0.25 ng/mL                                                 OR                                        > 80% decrease in PCT                                      Discontinue / Do not initiate                                              antibiotics  Performed at [facility], [address], [place],  [address]   Blood gas, arterial     Status: Abnormal   Collection Time: 04/22/21  6:00 AM  Result Value Ref Range   FIO2 100.00    pH, Arterial 7.391 7.350 - 7.450   pCO2 arterial 22.4 (L) 32.0 - 48.0 mmHg   pO2, Arterial 62.7 (L) 83.0 - 108.0 mmHg   Bicarbonate 13.3 (L) 20.0 - 28.0 mmol/L   Acid-base deficit 10.7 (H) 0.0 - 2.0 mmol/L   O2 Saturation 90.7 %   Patient temperature 36.9    Collection site LEFT RADIAL    Drawn by Orest Dikes, RRT    Sample type ARTERIAL    Allens test (pass/fail) PASS PASS    Comment: Performed at [facility], [place] [address], [place], [place] [address]  Glucose, capillary     Status: Abnormal   Collection Time: 04/22/21  7:37 AM  Result Value Ref Range   Glucose-Capillary 276 (H) 70 - 99 mg/dL    Comment: Glucose reference range applies only to samples taken after fasting for at least 8 hours.  Glucose, capillary     Status: Abnormal  Collection Time: 04/22/21 11:24 AM  Result Value Ref Range   Glucose-Capillary 313 (H) 70 - 99 mg/dL    Comment: Glucose reference range applies only to samples taken after fasting for at least 8 hours.  T4, free     Status: None   Collection Time: 04/22/21  4:12 PM  Result Value Ref Range   Free T4 1.12 0.61 - 1.12 ng/dL    Comment: (NOTE) Biotin ingestion may interfere with free T4 tests. If the results are inconsistent with the TSH level, previous test results, or the clinical presentation, then consider biotin interference. If needed, order repeat testing after stopping biotin. Performed at [facility], [place] [address] [place], [place] [address]   MRSA Next Gen by PCR, Nasal     Status: None   Collection Time: 04/22/21  4:40 PM   Specimen: Nasal Mucosa; Nasal Swab  Result Value Ref Range   MRSA by PCR Next Gen NOT DETECTED NOT DETECTED    Comment: (NOTE) The GeneXpert MRSA Assay (FDA approved for NASAL  specimens only), is one component of a comprehensive MRSA colonization surveillance program. It is not intended to diagnose MRSA infection nor to guide or monitor treatment for MRSA infections. Test performance is not FDA approved in patients less than 56 years old. Performed at [facility], [place] [address], [place], [place] [address]   Glucose, capillary     Status: None   Collection Time: 04/22/21  5:16 PM  Result Value Ref Range   Glucose-Capillary 77 70 - 99 mg/dL    Comment: Glucose reference range applies only to samples taken after fasting for at least 8 hours.  Glucose, capillary     Status: Abnormal   Collection Time: 04/22/21  9:05 PM  Result Value Ref Range   Glucose-Capillary 118 (H) 70 - 99 mg/dL    Comment: Glucose reference range applies only to samples taken after fasting for at least 8 hours.  Procalcitonin     Status: None   Collection Time: 04/23/21  3:39 AM  Result Value Ref Range   Procalcitonin 0.12 ng/mL    Comment:        Interpretation: PCT (Procalcitonin) <= 0.5 ng/mL: Systemic infection (sepsis) is not likely. Local bacterial infection is possible. (NOTE)       Sepsis PCT Algorithm           Lower Respiratory Tract                                      Infection PCT Algorithm    ----------------------------     ----------------------------         PCT < 0.25 ng/mL                PCT < 0.10 ng/mL          Strongly encourage             Strongly discourage   discontinuation of antibiotics    initiation of antibiotics    ----------------------------     -----------------------------       PCT 0.25 - 0.50 ng/mL            PCT 0.10 - 0.25 ng/mL               OR       >80% decrease in PCT  Discourage initiation of                                            antibiotics      Encourage discontinuation           of antibiotics    ----------------------------     -----------------------------         PCT >= 0.50 ng/mL              PCT 0.26 -  0.50 ng/mL               AND        <80% decrease in PCT             Encourage initiation of                                             antibiotics       Encourage continuation           of antibiotics    ----------------------------     -----------------------------        PCT >= 0.50 ng/mL                  PCT > 0.50 ng/mL               AND         increase in PCT                  Strongly encourage                                      initiation of antibiotics    Strongly encourage escalation           of antibiotics                                     -----------------------------                                           PCT <= 0.25 ng/mL                                                 OR                                        > 80% decrease in PCT                                      Discontinue / Do not initiate  antibiotics  Performed at [facility], [place] [address], [place], [place] [address]   Renal function panel     Status: Abnormal   Collection Time: 04/23/21  3:39 AM  Result Value Ref Range   Sodium 140 135 - 145 mmol/L   Potassium 3.7 3.5 - 5.1 mmol/L    Comment: DELTA CHECK NOTED   Chloride 114 (H) 98 - 111 mmol/L   CO2 16 (L) 22 - 32 mmol/L   Glucose, Bld 226 (H) 70 - 99 mg/dL    Comment: Glucose reference range applies only to samples taken after fasting for at least 8 hours.   BUN 40 (H) 8 - 23 mg/dL   Creatinine, Ser 2.42 (H) 0.61 - 1.24 mg/dL   Calcium 7.2 (L) 8.9 - 10.3 mg/dL   Phosphorus 4.6 2.5 - 4.6 mg/dL   Albumin 1.8 (L) 3.5 - 5.0 g/dL   GFR, Estimated 29 (L) >60 mL/min    Comment: (NOTE) Calculated using the CKD-EPI Creatinine Equation (2021)    Anion gap 10 5 - 15    Comment: Performed at [facility] [address], [place], [place] [address]  CBC with Differential/Platelet     Status: Abnormal   Collection Time: 04/23/21  3:39 AM  Result Value Ref Range   WBC 15.8 (H) 4.0 - 10.5  K/uL   RBC 3.13 (L) 4.22 - 5.81 MIL/uL   Hemoglobin 8.8 (L) 13.0 - 17.0 g/dL   HCT 27.8 (L) 39.0 - 52.0 %   MCV 88.8 80.0 - 100.0 fL   MCH 28.1 26.0 - 34.0 pg   MCHC 31.7 30.0 - 36.0 g/dL   RDW 16.3 (H) 11.5 - 15.5 %   Platelets 581 (H) 150 - 400 K/uL   nRBC 0.0 0.0 - 0.2 %   Neutrophils Relative % 78 %   Neutro Abs 12.3 (H) 1.7 - 7.7 K/uL   Lymphocytes Relative 14 %   Lymphs Abs 2.2 0.7 - 4.0 K/uL   Monocytes Relative 7 %   Monocytes Absolute 1.1 (H) 0.1 - 1.0 K/uL   Eosinophils Relative 0 %   Eosinophils Absolute 0.0 0.0 - 0.5 K/uL   Basophils Relative 0 %   Basophils Absolute 0.0 0.0 - 0.1 K/uL   Immature Granulocytes 1 %   Abs Immature Granulocytes 0.17 (H) 0.00 - 0.07 K/uL    Comment: Performed at [facility] [address] [place], [place] [address]  Glucose, capillary     Status: Abnormal   Collection Time: 04/23/21  7:25 AM  Result Value Ref Range   Glucose-Capillary 158 (H) 70 - 99 mg/dL    Comment: Glucose reference range applies only to samples taken after fasting for at least 8 hours.  Glucose, capillary     Status: Abnormal   Collection Time: 04/23/21 11:12 AM  Result Value Ref Range   Glucose-Capillary 129 (H) 70 - 99 mg/dL    Comment: Glucose reference range applies only to samples taken after fasting for at least 8 hours.  Glucose, capillary     Status: Abnormal   Collection Time: 04/23/21  4:08 PM  Result Value Ref Range   Glucose-Capillary 124 (H) 70 - 99 mg/dL    Comment: Glucose reference range applies only to samples taken after fasting for at least 8 hours.  Glucose, capillary     Status: Abnormal   Collection Time: 04/23/21  9:31 PM  Result Value Ref Range   Glucose-Capillary 118 (H) 70 -  99 mg/dL    Comment: Glucose reference range applies only to samples taken after fasting for at least 8 hours.  Renal function panel     Status: Abnormal   Collection Time: 04/24/21  3:26 AM  Result Value Ref Range   Sodium 140 135 - 145 mmol/L    Potassium 3.5 3.5 - 5.1 mmol/L   Chloride 115 (H) 98 - 111 mmol/L   CO2 15 (L) 22 - 32 mmol/L   Glucose, Bld 189 (H) 70 - 99 mg/dL    Comment: Glucose reference range applies only to samples taken after fasting for at least 8 hours.   BUN 42 (H) 8 - 23 mg/dL   Creatinine, Ser 2.34 (H) 0.61 - 1.24 mg/dL   Calcium 6.9 (L) 8.9 - 10.3 mg/dL   Phosphorus 4.3 2.5 - 4.6 mg/dL   Albumin 1.9 (L) 3.5 - 5.0 g/dL   GFR, Estimated 30 (L) >60 mL/min    Comment: (NOTE) Calculated using the CKD-EPI Creatinine Equation (2021)    Anion gap 10 5 - 15    Comment: Performed at [facility] [address], [place], [place] [address]  Urinalysis, Complete w Microscopic Urine, Clean Catch     Status: Abnormal   Collection Time: 04/24/21  4:46 AM  Result Value Ref Range   Color, Urine YELLOW YELLOW   APPearance CLEAR CLEAR   Specific Gravity, Urine 1.018 1.005 - 1.030   pH 5.0 5.0 - 8.0   Glucose, UA NEGATIVE NEGATIVE mg/dL   Hgb urine dipstick NEGATIVE NEGATIVE   Bilirubin Urine NEGATIVE NEGATIVE   Ketones, ur NEGATIVE NEGATIVE mg/dL   Protein, ur 100 (A) NEGATIVE mg/dL   Nitrite NEGATIVE NEGATIVE   Leukocytes,Ua NEGATIVE NEGATIVE   RBC / HPF 0-5 0 - 5 RBC/hpf   WBC, UA 0-5 0 - 5 WBC/hpf   Bacteria, UA NONE SEEN NONE SEEN   Mucus PRESENT     Comment: Performed at [facility] [address] [place], [place] [address]  Sodium, urine, random     Status: None   Collection Time: 04/24/21  4:46 AM  Result Value Ref Range   Sodium, Ur 81 mmol/L    Comment: Performed at [facility] [address] [place], [place] [address]  Protein / creatinine ratio, urine     Status: Abnormal   Collection Time: 04/24/21  4:46 AM  Result Value Ref Range   Creatinine, Urine 76.94 mg/dL   Total Protein, Urine 79 mg/dL    Comment: NO NORMAL RANGE ESTABLISHED FOR THIS TEST   Protein Creatinine Ratio 1.03 (H) 0.00 - 0.15 mg/mg[Cre]    Comment: Performed at [facility] [address],  [place], [place] [address]  Hemoglobin and hematocrit, blood     Status: Abnormal   Collection Time: 04/24/21  7:01 AM  Result Value Ref Range   Hemoglobin 9.9 (L) 13.0 - 17.0 g/dL   HCT 30.3 (L) 39.0 - 52.0 %    Comment: Performed at [facility] [address] [place], [place] [address]  Glucose, capillary     Status: None   Collection Time: 04/24/21  7:57 AM  Result Value Ref Range   Glucose-Capillary 94 70 - 99 mg/dL    Comment: Glucose reference range applies only to samples taken after fasting for at least 8 hours.  Glucose, capillary     Status: None   Collection Time: 04/24/21 11:42 AM  Result Value Ref Range  Glucose-Capillary 93 70 - 99 mg/dL    Comment: Glucose reference range applies only to samples taken after fasting for at least 8 hours.  Glucose, capillary     Status: Abnormal   Collection Time: 04/24/21  4:35 PM  Result Value Ref Range   Glucose-Capillary 158 (H) 70 - 99 mg/dL    Comment: Glucose reference range applies only to samples taken after fasting for at least 8 hours.  Glucose, capillary     Status: Abnormal   Collection Time: 04/24/21  9:30 PM  Result Value Ref Range   Glucose-Capillary 129 (H) 70 - 99 mg/dL    Comment: Glucose reference range applies only to samples taken after fasting for at least 8 hours.  Renal function panel     Status: Abnormal   Collection Time: 04/25/21  2:00 AM  Result Value Ref Range   Sodium 139 135 - 145 mmol/L   Potassium 3.5 3.5 - 5.1 mmol/L   Chloride 114 (H) 98 - 111 mmol/L   CO2 15 (L) 22 - 32 mmol/L   Glucose, Bld 62 (L) 70 - 99 mg/dL    Comment: Glucose reference range applies only to samples taken after fasting for at least 8 hours.   BUN 33 (H) 8 - 23 mg/dL   Creatinine, Ser 2.12 (H) 0.61 - 1.24 mg/dL   Calcium 6.6 (L) 8.9 - 10.3 mg/dL   Phosphorus 4.8 (H) 2.5 - 4.6 mg/dL   Albumin 1.8 (L) 3.5 - 5.0 g/dL   GFR, Estimated 34 (L) >60 mL/min    Comment: (NOTE) Calculated using the CKD-EPI Creatinine Equation  (2021)    Anion gap 10 5 - 15    Comment: Performed at [facility] [address], [place], [place] [address]  Glucose, capillary     Status: Abnormal   Collection Time: 04/25/21  7:49 AM  Result Value Ref Range   Glucose-Capillary 48 (L) 70 - 99 mg/dL    Comment: Glucose reference range applies only to samples taken after fasting for at least 8 hours.  Glucose, capillary     Status: None   Collection Time: 04/25/21  8:25 AM  Result Value Ref Range   Glucose-Capillary 84 70 - 99 mg/dL    Comment: Glucose reference range applies only to samples taken after fasting for at least 8 hours.  Glucose, capillary     Status: Abnormal   Collection Time: 04/25/21 11:55 AM  Result Value Ref Range   Glucose-Capillary 103 (H) 70 - 99 mg/dL    Comment: Glucose reference range applies only to samples taken after fasting for at least 8 hours.  Glucose, capillary     Status: None   Collection Time: 04/25/21  4:21 PM  Result Value Ref Range   Glucose-Capillary 83 70 - 99 mg/dL    Comment: Glucose reference range applies only to samples taken after fasting for at least 8 hours.  Glucose, capillary     Status: Abnormal   Collection Time: 04/25/21  9:41 PM  Result Value Ref Range   Glucose-Capillary 160 (H) 70 - 99 mg/dL    Comment: Glucose reference range applies only to samples taken after fasting for at least 8 hours.  Renal function panel     Status: Abnormal   Collection Time: 04/26/21  1:00 AM  Result Value Ref Range   Sodium 140 135 - 145 mmol/L   Potassium 3.4 (L) 3.5 - 5.1 mmol/L   Chloride 112 (H) 98 - 111 mmol/L  CO2 16 (L) 22 - 32 mmol/L   Glucose, Bld 104 (H) 70 - 99 mg/dL    Comment: Glucose reference range applies only to samples taken after fasting for at least 8 hours.   BUN 31 (H) 8 - 23 mg/dL   Creatinine, Ser 2.19 (H) 0.61 - 1.24 mg/dL   Calcium 6.9 (L) 8.9 - 10.3 mg/dL   Phosphorus 6.3 (H) 2.5 - 4.6 mg/dL   Albumin 1.8 (L) 3.5 - 5.0 g/dL   GFR, Estimated 33 (L) >60  mL/min    Comment: (NOTE) Calculated using the CKD-EPI Creatinine Equation (2021)    Anion gap 12 5 - 15    Comment: Performed at [facility] [address] [place], [place] [address]  Glucose, capillary     Status: Abnormal   Collection Time: 04/26/21  7:35 AM  Result Value Ref Range   Glucose-Capillary 162 (H) 70 - 99 mg/dL    Comment: Glucose reference range applies only to samples taken after fasting for at least 8 hours.  Magnesium     Status: None   Collection Time: 04/26/21  9:20 AM  Result Value Ref Range   Magnesium 1.8 1.7 - 2.4 mg/dL    Comment: Performed at [facility] [address], [place], [place] [address]  Glucose, capillary     Status: Abnormal   Collection Time: 04/26/21 11:40 AM  Result Value Ref Range   Glucose-Capillary 116 (H) 70 - 99 mg/dL    Comment: Glucose reference range applies only to samples taken after fasting for at least 8 hours.  Glucose, capillary     Status: Abnormal   Collection Time: 04/26/21  3:54 PM  Result Value Ref Range   Glucose-Capillary 123 (H) 70 - 99 mg/dL    Comment: Glucose reference range applies only to samples taken after fasting for at least 8 hours.  Glucose, capillary     Status: Abnormal   Collection Time: 04/26/21  9:13 PM  Result Value Ref Range   Glucose-Capillary 152 (H) 70 - 99 mg/dL    Comment: Glucose reference range applies only to samples taken after fasting for at least 8 hours.  Renal function panel     Status: Abnormal   Collection Time: 04/27/21  2:32 AM  Result Value Ref Range   Sodium 138 135 - 145 mmol/L   Potassium 3.4 (L) 3.5 - 5.1 mmol/L   Chloride 109 98 - 111 mmol/L   CO2 16 (L) 22 - 32 mmol/L   Glucose, Bld 143 (H) 70 - 99 mg/dL    Comment: Glucose reference range applies only to samples taken after fasting for at least 8 hours.   BUN 35 (H) 8 - 23 mg/dL   Creatinine, Ser 2.59 (H) 0.61 - 1.24 mg/dL   Calcium 6.7 (L) 8.9 - 10.3 mg/dL   Phosphorus 6.2 (H) 2.5 - 4.6 mg/dL    Albumin 1.8 (L) 3.5 - 5.0 g/dL   GFR, Estimated 26 (L) >60 mL/min    Comment: (NOTE) Calculated using the CKD-EPI Creatinine Equation (2021)    Anion gap 13 5 - 15    Comment: Performed at [facility] [address] [place], [place] [address]  CBC with Differential/Platelet     Status: Abnormal   Collection Time: 04/27/21  2:32 AM  Result Value Ref Range   WBC 15.8 (H) 4.0 - 10.5 K/uL   RBC 3.23 (L) 4.22 - 5.81 MIL/uL   Hemoglobin 9.0 (L) 13.0 -  17.0 g/dL   HCT 28.1 (L) 39.0 - 52.0 %   MCV 87.0 80.0 - 100.0 fL   MCH 27.9 26.0 - 34.0 pg   MCHC 32.0 30.0 - 36.0 g/dL   RDW 15.7 (H) 11.5 - 15.5 %   Platelets 571 (H) 150 - 400 K/uL   nRBC 0.0 0.0 - 0.2 %   Neutrophils Relative % 81 %   Neutro Abs 12.8 (H) 1.7 - 7.7 K/uL   Lymphocytes Relative 10 %   Lymphs Abs 1.6 0.7 - 4.0 K/uL   Monocytes Relative 7 %   Monocytes Absolute 1.1 (H) 0.1 - 1.0 K/uL   Eosinophils Relative 1 %   Eosinophils Absolute 0.1 0.0 - 0.5 K/uL   Basophils Relative 0 %   Basophils Absolute 0.1 0.0 - 0.1 K/uL   Immature Granulocytes 1 %   Abs Immature Granulocytes 0.16 (H) 0.00 - 0.07 K/uL    Comment: Performed at [facility] [address], [place], [place] [address]  Glucose, capillary     Status: Abnormal   Collection Time: 04/27/21  7:43 AM  Result Value Ref Range   Glucose-Capillary 111 (H) 70 - 99 mg/dL    Comment: Glucose reference range applies only to samples taken after fasting for at least 8 hours.  Glucose, capillary     Status: Abnormal   Collection Time: 04/27/21 11:19 AM  Result Value Ref Range   Glucose-Capillary 113 (H) 70 - 99 mg/dL    Comment: Glucose reference range applies only to samples taken after fasting for at least 8 hours.  Glucose, capillary     Status: None   Collection Time: 04/27/21  4:46 PM  Result Value Ref Range   Glucose-Capillary 76 70 - 99 mg/dL    Comment: Glucose reference range applies only to samples taken after fasting for at least 8 hours.   Glucose, capillary     Status: Abnormal   Collection Time: 04/27/21  9:32 PM  Result Value Ref Range   Glucose-Capillary 163 (H) 70 - 99 mg/dL    Comment: Glucose reference range applies only to samples taken after fasting for at least 8 hours.  Renal function panel     Status: Abnormal   Collection Time: 04/28/21  1:34 AM  Result Value Ref Range   Sodium 136 135 - 145 mmol/L   Potassium 3.5 3.5 - 5.1 mmol/L   Chloride 105 98 - 111 mmol/L   CO2 18 (L) 22 - 32 mmol/L   Glucose, Bld 149 (H) 70 - 99 mg/dL    Comment: Glucose reference range applies only to samples taken after fasting for at least 8 hours.   BUN 42 (H) 8 - 23 mg/dL   Creatinine, Ser 2.89 (H) 0.61 - 1.24 mg/dL   Calcium 6.6 (L) 8.9 - 10.3 mg/dL   Phosphorus 5.2 (H) 2.5 - 4.6 mg/dL   Albumin 1.9 (L) 3.5 - 5.0 g/dL   GFR, Estimated 23 (L) >60 mL/min    Comment: (NOTE) Calculated using the CKD-EPI Creatinine Equation (2021)    Anion gap 13 5 - 15    Comment: Performed at [facility] [address] [place], [place] [address]  Magnesium     Status: None   Collection Time: 04/28/21  1:34 AM  Result Value Ref Range   Magnesium 2.1 1.7 - 2.4 mg/dL    Comment: Performed at [facility] [address], [place], [place] [address]  CBC  Status: Abnormal   Collection Time: 04/28/21  1:34 AM  Result Value Ref Range   WBC 16.4 (H) 4.0 - 10.5 K/uL   RBC 3.29 (L) 4.22 - 5.81 MIL/uL   Hemoglobin 9.3 (L) 13.0 - 17.0 g/dL   HCT 28.2 (L) 39.0 - 52.0 %   MCV 85.7 80.0 - 100.0 fL   MCH 28.3 26.0 - 34.0 pg   MCHC 33.0 30.0 - 36.0 g/dL   RDW 15.4 11.5 - 15.5 %   Platelets 603 (H) 150 - 400 K/uL   nRBC 0.0 0.0 - 0.2 %    Comment: Performed at [facility] [address] [place], [place] [address]  Glucose, capillary     Status: Abnormal   Collection Time: 04/28/21  7:49 AM  Result Value Ref Range   Glucose-Capillary 231 (H) 70 - 99 mg/dL    Comment: Glucose reference range applies only to samples taken  after fasting for at least 8 hours.  Glucose, capillary     Status: Abnormal   Collection Time: 04/28/21 11:24 AM  Result Value Ref Range   Glucose-Capillary 139 (H) 70 - 99 mg/dL    Comment: Glucose reference range applies only to samples taken after fasting for at least 8 hours.  Glucose, capillary     Status: Abnormal   Collection Time: 04/28/21  4:12 PM  Result Value Ref Range   Glucose-Capillary 127 (H) 70 - 99 mg/dL    Comment: Glucose reference range applies only to samples taken after fasting for at least 8 hours.  Basic metabolic panel     Status: Abnormal   Collection Time: 04/28/21  7:05 PM  Result Value Ref Range   Sodium 137 135 - 145 mmol/L   Potassium 3.7 3.5 - 5.1 mmol/L   Chloride 105 98 - 111 mmol/L   CO2 19 (L) 22 - 32 mmol/L   Glucose, Bld 166 (H) 70 - 99 mg/dL    Comment: Glucose reference range applies only to samples taken after fasting for at least 8 hours.   BUN 44 (H) 8 - 23 mg/dL   Creatinine, Ser 2.99 (H) 0.61 - 1.24 mg/dL   Calcium 6.3 (LL) 8.9 - 10.3 mg/dL    Comment: CRITICAL RESULT CALLED TO, READ BACK BY AND VERIFIED WITH: L.MITCHELL,RN @2004  04/28/2021 [email or web address]    GFR, Estimated 22 (L) >60 mL/min    Comment: (NOTE) Calculated using the CKD-EPI Creatinine Equation (2021)    Anion gap 13 5 - 15    Comment: Performed at [facility], [place] [address], [place], [place] [address]  Magnesium     Status: None   Collection Time: 04/28/21  7:05 PM  Result Value Ref Range   Magnesium 1.8 1.7 - 2.4 mg/dL    Comment: Performed at [facility], [place] [address] [place], [place] [address]  Glucose, capillary     Status: Abnormal   Collection Time: 04/28/21  9:51 PM  Result Value Ref Range   Glucose-Capillary 212 (H) 70 - 99 mg/dL    Comment: Glucose reference range applies only to samples taken after fasting for at least 8 hours.  Basic metabolic panel     Status: Abnormal   Collection Time: 04/29/21  4:33 AM  Result Value Ref Range   Sodium 136  135 - 145 mmol/L   Potassium 4.2 3.5 - 5.1 mmol/L   Chloride 104 98 - 111 mmol/L   CO2 19 (L) 22 - 32 mmol/L  Glucose, Bld 191 (H) 70 - 99 mg/dL    Comment: Glucose reference range applies only to samples taken after fasting for at least 8 hours.   BUN 43 (H) 8 - 23 mg/dL   Creatinine, Ser 2.91 (H) 0.61 - 1.24 mg/dL   Calcium 6.5 (L) 8.9 - 10.3 mg/dL   GFR, Estimated 23 (L) >60 mL/min    Comment: (NOTE) Calculated using the CKD-EPI Creatinine Equation (2021)    Anion gap 13 5 - 15    Comment: Performed at [facility] [address] [place], [place] [address]  CBC     Status: Abnormal   Collection Time: 04/29/21  4:33 AM  Result Value Ref Range   WBC 14.7 (H) 4.0 - 10.5 K/uL   RBC 3.17 (L) 4.22 - 5.81 MIL/uL   Hemoglobin 9.0 (L) 13.0 - 17.0 g/dL   HCT 27.4 (L) 39.0 - 52.0 %   MCV 86.4 80.0 - 100.0 fL   MCH 28.4 26.0 - 34.0 pg   MCHC 32.8 30.0 - 36.0 g/dL   RDW 15.4 11.5 - 15.5 %   Platelets 617 (H) 150 - 400 K/uL   nRBC 0.0 0.0 - 0.2 %    Comment: Performed at [facility], [place] [address], [place], [place] [address]  Calcium, ionized     Status: Abnormal   Collection Time: 04/29/21  4:33 AM  Result Value Ref Range   Calcium, Ionized, Serum 4.0 (L) 4.5 - 5.6 mg/dL    Comment: (NOTE) Performed At: [facility] [place], [place] [id number] Rush Farmer MD [id number]   Magnesium     Status: None   Collection Time: 04/29/21  4:33 AM  Result Value Ref Range   Magnesium 2.2 1.7 - 2.4 mg/dL    Comment: Performed at [facility], [place] [address], [place], [place] [address]  Glucose, capillary     Status: Abnormal   Collection Time: 04/29/21  7:58 AM  Result Value Ref Range   Glucose-Capillary 136 (H) 70 - 99 mg/dL    Comment: Glucose reference range applies only to samples taken after fasting for at least 8 hours.  Glucose, capillary     Status: Abnormal   Collection Time: 04/29/21 11:35 AM  Result Value Ref Range    Glucose-Capillary 211 (H) 70 - 99 mg/dL    Comment: Glucose reference range applies only to samples taken after fasting for at least 8 hours.  Glucose, capillary     Status: Abnormal   Collection Time: 04/29/21  4:46 PM  Result Value Ref Range   Glucose-Capillary 136 (H) 70 - 99 mg/dL    Comment: Glucose reference range applies only to samples taken after fasting for at least 8 hours.  Glucose, capillary     Status: Abnormal   Collection Time: 04/29/21  9:35 PM  Result Value Ref Range   Glucose-Capillary 169 (H) 70 - 99 mg/dL    Comment: Glucose reference range applies only to samples taken after fasting for at least 8 hours.  Basic metabolic panel     Status: Abnormal   Collection Time: 04/30/21  1:57 AM  Result Value Ref Range   Sodium 136 135 - 145 mmol/L   Potassium 4.3 3.5 - 5.1 mmol/L   Chloride 107 98 - 111 mmol/L   CO2 18 (L) 22 - 32 mmol/L   Glucose, Bld 258 (H) 70 - 99 mg/dL    Comment: Glucose reference range applies only to  samples taken after fasting for at least 8 hours.   BUN 40 (H) 8 - 23 mg/dL   Creatinine, Ser 2.84 (H) 0.61 - 1.24 mg/dL   Calcium 6.4 (LL) 8.9 - 10.3 mg/dL    Comment: CRITICAL RESULT CALLED TO, READ BACK BY AND VERIFIED WITH: ESSIEN AKPAN I,RN 04/30/21 0303 WAYK    GFR, Estimated 24 (L) >60 mL/min    Comment: (NOTE) Calculated using the CKD-EPI Creatinine Equation (2021)    Anion gap 11 5 - 15    Comment: Performed at [facility] [address], [place], [place] [address]  Magnesium     Status: None   Collection Time: 04/30/21  1:57 AM  Result Value Ref Range   Magnesium 2.0 1.7 - 2.4 mg/dL    Comment: Performed at [facility] [address], [place], [place] [address]  Glucose, capillary     Status: Abnormal   Collection Time: 04/30/21  7:31 AM  Result Value Ref Range   Glucose-Capillary 212 (H) 70 - 99 mg/dL    Comment: Glucose reference range applies only to samples taken after fasting for at least 8 hours.  Glucose,  capillary     Status: Abnormal   Collection Time: 04/30/21 11:23 AM  Result Value Ref Range   Glucose-Capillary 195 (H) 70 - 99 mg/dL    Comment: Glucose reference range applies only to samples taken after fasting for at least 8 hours.  Calcium, ionized     Status: Abnormal   Collection Time: 04/30/21 12:14 PM  Result Value Ref Range   Calcium, Ionized, Serum 3.9 (L) 4.5 - 5.6 mg/dL    Comment: (NOTE) Performed At: [facility] [place], [place] [id number] Rush Farmer MD [id number]   Parathyroid hormone, intact (no Ca)     Status: None   Collection Time: 04/30/21 12:14 PM  Result Value Ref Range   PTH 43 15 - 65 pg/mL    Comment: (NOTE) Performed At: [facility] [place], [place] [id number] Rush Farmer MD [id number]   VITAMIN D 25 Hydroxy (Vit-D Deficiency, Fractures)     Status: Abnormal   Collection Time: 04/30/21 12:14 PM  Result Value Ref Range   Vit D, 25-Hydroxy 29.70 (L) 30 - 100 ng/mL    Comment: (NOTE) Vitamin D deficiency has been defined by the Institute of Medicine  and an Endocrine Society practice guideline as a level of serum 25-OH  vitamin D less than 20 ng/mL (1,2). The Endocrine Society went on to  further define vitamin D insufficiency as a level between 21 and 29  ng/mL (2).  1. IOM (Institute of Medicine). 2010. Dietary reference intakes for  calcium and D. [place]: The [employer]. 2. Holick MF, Binkley Merigold, Bischoff-Ferrari HA, et al. Evaluation,  treatment, and prevention of vitamin D deficiency: an Endocrine  Society clinical practice guideline, JCEM. 2011 Jul; 96(7): 1911-30.  Performed at [facility], [place] [address], [place], [place] [address]   Glucose, capillary     Status: Abnormal   Collection Time: 04/30/21  4:24 PM  Result Value Ref Range   Glucose-Capillary 202 (H) 70 - 99 mg/dL    Comment: Glucose reference range applies only to samples taken after  fasting for at least 8 hours.  Glucose, capillary     Status: Abnormal   Collection Time: 04/30/21  9:16 PM  Result Value Ref Range   Glucose-Capillary 180 (H) 70 - 99 mg/dL  Comment: Glucose reference range applies only to samples taken after fasting for at least 8 hours.  Renal function panel     Status: Abnormal   Collection Time: 05/01/21  1:36 AM  Result Value Ref Range   Sodium 139 135 - 145 mmol/L   Potassium 4.1 3.5 - 5.1 mmol/L   Chloride 111 98 - 111 mmol/L   CO2 18 (L) 22 - 32 mmol/L   Glucose, Bld 173 (H) 70 - 99 mg/dL    Comment: Glucose reference range applies only to samples taken after fasting for at least 8 hours.   BUN 35 (H) 8 - 23 mg/dL   Creatinine, Ser 2.62 (H) 0.61 - 1.24 mg/dL   Calcium 7.0 (L) 8.9 - 10.3 mg/dL   Phosphorus 3.1 2.5 - 4.6 mg/dL   Albumin 2.0 (L) 3.5 - 5.0 g/dL   GFR, Estimated 26 (L) >60 mL/min    Comment: (NOTE) Calculated using the CKD-EPI Creatinine Equation (2021)    Anion gap 10 5 - 15    Comment: Performed at [facility] [address] [place], [place] [address]  Glucose, capillary     Status: Abnormal   Collection Time: 05/01/21  7:52 AM  Result Value Ref Range   Glucose-Capillary 233 (H) 70 - 99 mg/dL    Comment: Glucose reference range applies only to samples taken after fasting for at least 8 hours.  Glucose, capillary     Status: None   Collection Time: 05/01/21 12:56 PM  Result Value Ref Range   Glucose-Capillary 79 70 - 99 mg/dL    Comment: Glucose reference range applies only to samples taken after fasting for at least 8 hours.  Glucose, capillary     Status: Abnormal   Collection Time: 05/01/21  2:10 PM  Result Value Ref Range   Glucose-Capillary 125 (H) 70 - 99 mg/dL    Comment: Glucose reference range applies only to samples taken after fasting for at least 8 hours.  Glucose, capillary     Status: Abnormal   Collection Time: 05/01/21  5:37 PM  Result Value Ref Range   Glucose-Capillary 219 (H) 70 - 99  mg/dL    Comment: Glucose reference range applies only to samples taken after fasting for at least 8 hours.  Glucose, capillary     Status: Abnormal   Collection Time: 05/01/21 10:43 PM  Result Value Ref Range   Glucose-Capillary 115 (H) 70 - 99 mg/dL    Comment: Glucose reference range applies only to samples taken after fasting for at least 8 hours.  Renal function panel     Status: Abnormal   Collection Time: 05/02/21  2:06 AM  Result Value Ref Range   Sodium 139 135 - 145 mmol/L   Potassium 3.8 3.5 - 5.1 mmol/L   Chloride 109 98 - 111 mmol/L   CO2 19 (L) 22 - 32 mmol/L   Glucose, Bld 146 (H) 70 - 99 mg/dL    Comment: Glucose reference range applies only to samples taken after fasting for at least 8 hours.   BUN 31 (H) 8 - 23 mg/dL   Creatinine, Ser 2.30 (H) 0.61 - 1.24 mg/dL   Calcium 6.9 (L) 8.9 - 10.3 mg/dL   Phosphorus 3.4 2.5 - 4.6 mg/dL   Albumin 1.8 (L) 3.5 - 5.0 g/dL   GFR, Estimated 31 (L) >60 mL/min    Comment: (NOTE) Calculated using the CKD-EPI Creatinine Equation (2021)    Anion gap 11 5 - 15  Comment: Performed at [facility], [place] [address], [place], [place] [address]  Glucose, capillary     Status: Abnormal   Collection Time: 05/02/21  7:43 AM  Result Value Ref Range   Glucose-Capillary 122 (H) 70 - 99 mg/dL    Comment: Glucose reference range applies only to samples taken after fasting for at least 8 hours.  Glucose, capillary     Status: Abnormal   Collection Time: 05/02/21 11:27 AM  Result Value Ref Range   Glucose-Capillary 184 (H) 70 - 99 mg/dL    Comment: Glucose reference range applies only to samples taken after fasting for at least 8 hours.  Glucose, capillary     Status: Abnormal   Collection Time: 05/02/21  5:02 PM  Result Value Ref Range   Glucose-Capillary 141 (H) 70 - 99 mg/dL    Comment: Glucose reference range applies only to samples taken after fasting for at least 8 hours.  Glucose, capillary     Status: None   Collection Time:  05/02/21  9:23 PM  Result Value Ref Range   Glucose-Capillary 83 70 - 99 mg/dL    Comment: Glucose reference range applies only to samples taken after fasting for at least 8 hours.  Renal function panel     Status: Abnormal   Collection Time: 05/03/21  1:00 AM  Result Value Ref Range   Sodium 138 135 - 145 mmol/L   Potassium 4.0 3.5 - 5.1 mmol/L   Chloride 109 98 - 111 mmol/L   CO2 20 (L) 22 - 32 mmol/L   Glucose, Bld 159 (H) 70 - 99 mg/dL    Comment: Glucose reference range applies only to samples taken after fasting for at least 8 hours.   BUN 26 (H) 8 - 23 mg/dL   Creatinine, Ser 2.21 (H) 0.61 - 1.24 mg/dL   Calcium 7.0 (L) 8.9 - 10.3 mg/dL   Phosphorus 5.1 (H) 2.5 - 4.6 mg/dL   Albumin 1.8 (L) 3.5 - 5.0 g/dL   GFR, Estimated 32 (L) >60 mL/min    Comment: (NOTE) Calculated using the CKD-EPI Creatinine Equation (2021)    Anion gap 9 5 - 15    Comment: Performed at [facility] [address] [place], [place] [address]  Glucose, capillary     Status: None   Collection Time: 05/03/21  7:38 AM  Result Value Ref Range   Glucose-Capillary 95 70 - 99 mg/dL    Comment: Glucose reference range applies only to samples taken after fasting for at least 8 hours.  Glucose, capillary     Status: None   Collection Time: 05/03/21 11:29 AM  Result Value Ref Range   Glucose-Capillary 79 70 - 99 mg/dL    Comment: Glucose reference range applies only to samples taken after fasting for at least 8 hours.  Glucose, capillary     Status: Abnormal   Collection Time: 05/03/21  4:11 PM  Result Value Ref Range   Glucose-Capillary 208 (H) 70 - 99 mg/dL    Comment: Glucose reference range applies only to samples taken after fasting for at least 8 hours.  Glucose, capillary     Status: Abnormal   Collection Time: 05/03/21  9:40 PM  Result Value Ref Range   Glucose-Capillary 198 (H) 70 - 99 mg/dL    Comment: Glucose reference range applies only to samples taken after fasting for at least 8  hours.  Renal function panel     Status: Abnormal  Collection Time: 05/04/21  2:04 AM  Result Value Ref Range   Sodium 140 135 - 145 mmol/L   Potassium 3.5 3.5 - 5.1 mmol/L   Chloride 110 98 - 111 mmol/L   CO2 18 (L) 22 - 32 mmol/L   Glucose, Bld 174 (H) 70 - 99 mg/dL    Comment: Glucose reference range applies only to samples taken after fasting for at least 8 hours.   BUN 25 (H) 8 - 23 mg/dL   Creatinine, Ser 2.14 (H) 0.61 - 1.24 mg/dL   Calcium 6.7 (L) 8.9 - 10.3 mg/dL   Phosphorus 5.1 (H) 2.5 - 4.6 mg/dL   Albumin 1.8 (L) 3.5 - 5.0 g/dL   GFR, Estimated 33 (L) >60 mL/min    Comment: (NOTE) Calculated using the CKD-EPI Creatinine Equation (2021)    Anion gap 12 5 - 15    Comment: Performed at [facility] [address] [place], [place] [address]  Magnesium     Status: None   Collection Time: 05/04/21  2:04 AM  Result Value Ref Range   Magnesium 1.8 1.7 - 2.4 mg/dL    Comment: Performed at [facility] [address] [place], [place] [address]  CBC with Differential/Platelet     Status: Abnormal   Collection Time: 05/04/21  2:04 AM  Result Value Ref Range   WBC 14.6 (H) 4.0 - 10.5 K/uL   RBC 3.13 (L) 4.22 - 5.81 MIL/uL   Hemoglobin 8.7 (L) 13.0 - 17.0 g/dL   HCT 27.6 (L) 39.0 - 52.0 %   MCV 88.2 80.0 - 100.0 fL   MCH 27.8 26.0 - 34.0 pg   MCHC 31.5 30.0 - 36.0 g/dL   RDW 15.3 11.5 - 15.5 %   Platelets 482 (H) 150 - 400 K/uL   nRBC 0.0 0.0 - 0.2 %   Neutrophils Relative % 72 %   Neutro Abs 10.7 (H) 1.7 - 7.7 K/uL   Lymphocytes Relative 17 %   Lymphs Abs 2.5 0.7 - 4.0 K/uL   Monocytes Relative 5 %   Monocytes Absolute 0.7 0.1 - 1.0 K/uL   Eosinophils Relative 2 %   Eosinophils Absolute 0.2 0.0 - 0.5 K/uL   Basophils Relative 1 %   Basophils Absolute 0.1 0.0 - 0.1 K/uL   Immature Granulocytes 3 %   Abs Immature Granulocytes 0.40 (H) 0.00 - 0.07 K/uL    Comment: Performed at [facility], [place] [address] [place], [place] [address]  Glucose,  capillary     Status: Abnormal   Collection Time: 05/04/21  7:41 AM  Result Value Ref Range   Glucose-Capillary 145 (H) 70 - 99 mg/dL    Comment: Glucose reference range applies only to samples taken after fasting for at least 8 hours.  Glucose, capillary     Status: Abnormal   Collection Time: 05/04/21 11:52 AM  Result Value Ref Range   Glucose-Capillary 101 (H) 70 - 99 mg/dL    Comment: Glucose reference range applies only to samples taken after fasting for at least 8 hours.  Glucose, capillary     Status: None   Collection Time: 05/04/21  4:36 PM  Result Value Ref Range   Glucose-Capillary 92 70 - 99 mg/dL    Comment: Glucose reference range applies only to samples taken after fasting for at least 8 hours.  SARS CORONAVIRUS 2 (TAT 6-24 HRS) Nasopharyngeal Nasopharyngeal Swab     Status: None   Collection Time: 05/04/21  4:46 PM   Specimen: Nasopharyngeal Swab  Result Value Ref Range   SARS Coronavirus 2 NEGATIVE NEGATIVE    Comment: (NOTE) SARS-CoV-2 target nucleic acids are NOT DETECTED.  The SARS-CoV-2 RNA is generally detectable in upper and lower respiratory specimens during the acute phase of infection. Negative results do not preclude SARS-CoV-2 infection, do not rule out co-infections with other pathogens, and should not be used as the sole basis for treatment or other patient management decisions. Negative results must be combined with clinical observations, patient history, and epidemiological information. The expected result is Negative.  Fact Sheet for Patients: SugarRoll.be  Fact Sheet for Healthcare Providers: https://www.woods-mathews.com/  This test is not yet approved or cleared by the [place] FDA and  has been authorized for detection and/or diagnosis of SARS-CoV-2 by FDA under an Emergency Use Authorization (EUA). This EUA will remain  in effect (meaning this test can be used) for the duration of the COVID-19  declaration under Se ction 564(b)(1) of the Act, 21 U.S.C. section 360bbb-3(b)(1), unless the authorization is terminated or revoked sooner.  Performed at [facility], [place] [address] [place], [place] [address]   Glucose, capillary     Status: Abnormal   Collection Time: 05/04/21  7:34 PM  Result Value Ref Range   Glucose-Capillary 121 (H) 70 - 99 mg/dL    Comment: Glucose reference range applies only to samples taken after fasting for at least 8 hours.  Renal function panel     Status: Abnormal   Collection Time: 05/05/21  2:34 AM  Result Value Ref Range   Sodium 139 135 - 145 mmol/L   Potassium 3.5 3.5 - 5.1 mmol/L   Chloride 112 (H) 98 - 111 mmol/L   CO2 19 (L) 22 - 32 mmol/L   Glucose, Bld 97 70 - 99 mg/dL    Comment: Glucose reference range applies only to samples taken after fasting for at least 8 hours.   BUN 24 (H) 8 - 23 mg/dL   Creatinine, Ser 2.12 (H) 0.61 - 1.24 mg/dL   Calcium 6.9 (L) 8.9 - 10.3 mg/dL   Phosphorus 5.1 (H) 2.5 - 4.6 mg/dL   Albumin 1.7 (L) 3.5 - 5.0 g/dL   GFR, Estimated 34 (L) >60 mL/min    Comment: (NOTE) Calculated using the CKD-EPI Creatinine Equation (2021)    Anion gap 8 5 - 15    Comment: Performed at [facility] [address], [place], [place] [address]  Magnesium     Status: None   Collection Time: 05/05/21  2:34 AM  Result Value Ref Range   Magnesium 1.8 1.7 - 2.4 mg/dL    Comment: Performed at [facility] [address], [place], [place] [address]  Procalcitonin - Baseline     Status: None   Collection Time: 05/05/21  2:34 AM  Result Value Ref Range   Procalcitonin 0.22 ng/mL    Comment:        Interpretation: PCT (Procalcitonin) <= 0.5 ng/mL: Systemic infection (sepsis) is not likely. Local bacterial infection is possible. (NOTE)       Sepsis PCT Algorithm           Lower Respiratory Tract                                      Infection PCT Algorithm    ----------------------------      ----------------------------  PCT < 0.25 ng/mL                PCT < 0.10 ng/mL          Strongly encourage             Strongly discourage   discontinuation of antibiotics    initiation of antibiotics    ----------------------------     -----------------------------       PCT 0.25 - 0.50 ng/mL            PCT 0.10 - 0.25 ng/mL               OR       >80% decrease in PCT            Discourage initiation of                                            antibiotics      Encourage discontinuation           of antibiotics    ----------------------------     -----------------------------         PCT >= 0.50 ng/mL              PCT 0.26 - 0.50 ng/mL               AND        <80% decrease in PCT             Encourage initiation of                                             antibiotics       Encourage continuation           of antibiotics    ----------------------------     -----------------------------        PCT >= 0.50 ng/mL                  PCT > 0.50 ng/mL               AND         increase in PCT                  Strongly encourage                                      initiation of antibiotics    Strongly encourage escalation           of antibiotics                                     -----------------------------                                           PCT <= 0.25 ng/mL  OR                                        > 80% decrease in PCT                                      Discontinue / Do not initiate                                             antibiotics  Performed at [facility], [place] [address], [place], [place] [address]   Glucose, capillary     Status: Abnormal   Collection Time: 05/05/21  7:20 AM  Result Value Ref Range   Glucose-Capillary 110 (H) 70 - 99 mg/dL    Comment: Glucose reference range applies only to samples taken after fasting for at least 8 hours.  Glucose, capillary     Status: Abnormal    Collection Time: 05/05/21 11:28 AM  Result Value Ref Range   Glucose-Capillary 102 (H) 70 - 99 mg/dL    Comment: Glucose reference range applies only to samples taken after fasting for at least 8 hours.  Sedimentation rate     Status: Abnormal   Collection Time: 05/05/21  2:13 PM  Result Value Ref Range   Sed Rate >140 (H) 0 - 16 mm/hr    Comment: Performed at [facility] [address], [place], [place] [address]  Glucose, capillary     Status: None   Collection Time: 05/05/21  4:08 PM  Result Value Ref Range   Glucose-Capillary 79 70 - 99 mg/dL    Comment: Glucose reference range applies only to samples taken after fasting for at least 8 hours.  Glucose, capillary     Status: None   Collection Time: 05/05/21  9:41 PM  Result Value Ref Range   Glucose-Capillary 95 70 - 99 mg/dL    Comment: Glucose reference range applies only to samples taken after fasting for at least 8 hours.  Procalcitonin     Status: None   Collection Time: 05/06/21  1:54 AM  Result Value Ref Range   Procalcitonin 0.28 ng/mL    Comment:        Interpretation: PCT (Procalcitonin) <= 0.5 ng/mL: Systemic infection (sepsis) is not likely. Local bacterial infection is possible. (NOTE)       Sepsis PCT Algorithm           Lower Respiratory Tract                                      Infection PCT Algorithm    ----------------------------     ----------------------------         PCT < 0.25 ng/mL                PCT < 0.10 ng/mL          Strongly encourage             Strongly discourage   discontinuation of antibiotics    initiation of antibiotics    ----------------------------     -----------------------------  PCT 0.25 - 0.50 ng/mL            PCT 0.10 - 0.25 ng/mL               OR       >80% decrease in PCT            Discourage initiation of                                            antibiotics      Encourage discontinuation           of antibiotics    ----------------------------      -----------------------------         PCT >= 0.50 ng/mL              PCT 0.26 - 0.50 ng/mL               AND        <80% decrease in PCT             Encourage initiation of                                             antibiotics       Encourage continuation           of antibiotics    ----------------------------     -----------------------------        PCT >= 0.50 ng/mL                  PCT > 0.50 ng/mL               AND         increase in PCT                  Strongly encourage                                      initiation of antibiotics    Strongly encourage escalation           of antibiotics                                     -----------------------------                                           PCT <= 0.25 ng/mL                                                 OR                                        > 80% decrease in PCT  Discontinue / Do not initiate                                             antibiotics  Performed at [facility], [place] [address] [place], [place] [address]   CBC     Status: Abnormal   Collection Time: 05/06/21  1:54 AM  Result Value Ref Range   WBC 13.1 (H) 4.0 - 10.5 K/uL   RBC 3.33 (L) 4.22 - 5.81 MIL/uL   Hemoglobin 9.4 (L) 13.0 - 17.0 g/dL   HCT 28.9 (L) 39.0 - 52.0 %   MCV 86.8 80.0 - 100.0 fL   MCH 28.2 26.0 - 34.0 pg   MCHC 32.5 30.0 - 36.0 g/dL   RDW 14.9 11.5 - 15.5 %   Platelets 519 (H) 150 - 400 K/uL   nRBC 0.0 0.0 - 0.2 %    Comment: Performed at [facility], [place] [address], [place], [place] [address]  Basic metabolic panel     Status: Abnormal   Collection Time: 05/06/21  1:54 AM  Result Value Ref Range   Sodium 142 135 - 145 mmol/L   Potassium 4.3 3.5 - 5.1 mmol/L    Comment: DELTA CHECK NOTED   Chloride 111 98 - 111 mmol/L   CO2 20 (L) 22 - 32 mmol/L   Glucose, Bld 97 70 - 99 mg/dL    Comment: Glucose reference range applies only to samples taken after fasting for at least 8  hours.   BUN 24 (H) 8 - 23 mg/dL   Creatinine, Ser 2.46 (H) 0.61 - 1.24 mg/dL   Calcium 7.3 (L) 8.9 - 10.3 mg/dL   GFR, Estimated 28 (L) >60 mL/min    Comment: (NOTE) Calculated using the CKD-EPI Creatinine Equation (2021)    Anion gap 11 5 - 15    Comment: Performed at [facility] [address] [place], [place] [address]  Magnesium     Status: None   Collection Time: 05/06/21  1:54 AM  Result Value Ref Range   Magnesium 1.8 1.7 - 2.4 mg/dL    Comment: Performed at [facility] [address], [place], [place] [address]  Glucose, capillary     Status: Abnormal   Collection Time: 05/06/21  7:55 AM  Result Value Ref Range   Glucose-Capillary 170 (H) 70 - 99 mg/dL    Comment: Glucose reference range applies only to samples taken after fasting for at least 8 hours.  Glucose, capillary     Status: Abnormal   Collection Time: 05/06/21 11:59 AM  Result Value Ref Range   Glucose-Capillary 66 (L) 70 - 99 mg/dL    Comment: Glucose reference range applies only to samples taken after fasting for at least 8 hours.  Glucose, capillary     Status: Abnormal   Collection Time: 05/06/21 12:22 PM  Result Value Ref Range   Glucose-Capillary 66 (L) 70 - 99 mg/dL    Comment: Glucose reference range applies only to samples taken after fasting for at least 8 hours.  Glucose, capillary     Status: None   Collection Time: 05/06/21 12:59 PM  Result Value Ref Range   Glucose-Capillary 97 70 - 99 mg/dL    Comment: Glucose reference range applies only to samples taken after fasting for at least 8 hours.  Glucose, capillary  Status: None   Collection Time: 05/06/21  3:49 PM  Result Value Ref Range   Glucose-Capillary 97 70 - 99 mg/dL    Comment: Glucose reference range applies only to samples taken after fasting for at least 8 hours.  Glucose, capillary     Status: Abnormal   Collection Time: 05/06/21  9:37 PM  Result Value Ref Range   Glucose-Capillary 157 (H) 70 - 99 mg/dL     Comment: Glucose reference range applies only to samples taken after fasting for at least 8 hours.  Basic metabolic panel     Status: Abnormal   Collection Time: 05/07/21  2:33 AM  Result Value Ref Range   Sodium 139 135 - 145 mmol/L   Potassium 3.3 (L) 3.5 - 5.1 mmol/L    Comment: DELTA CHECK NOTED   Chloride 106 98 - 111 mmol/L   CO2 22 22 - 32 mmol/L   Glucose, Bld 98 70 - 99 mg/dL    Comment: Glucose reference range applies only to samples taken after fasting for at least 8 hours.   BUN 29 (H) 8 - 23 mg/dL   Creatinine, Ser 2.62 (H) 0.61 - 1.24 mg/dL   Calcium 7.0 (L) 8.9 - 10.3 mg/dL   GFR, Estimated 26 (L) >60 mL/min    Comment: (NOTE) Calculated using the CKD-EPI Creatinine Equation (2021)    Anion gap 11 5 - 15    Comment: Performed at [facility] [address], [place], [place] [address]  Glucose, capillary     Status: Abnormal   Collection Time: 05/07/21  8:06 AM  Result Value Ref Range   Glucose-Capillary 106 (H) 70 - 99 mg/dL    Comment: Glucose reference range applies only to samples taken after fasting for at least 8 hours.  Glucose, capillary     Status: Abnormal   Collection Time: 05/07/21 11:25 AM  Result Value Ref Range   Glucose-Capillary 129 (H) 70 - 99 mg/dL    Comment: Glucose reference range applies only to samples taken after fasting for at least 8 hours.  Glucose, capillary     Status: Abnormal   Collection Time: 05/07/21  4:14 PM  Result Value Ref Range   Glucose-Capillary 124 (H) 70 - 99 mg/dL    Comment: Glucose reference range applies only to samples taken after fasting for at least 8 hours.  Glucose, capillary     Status: Abnormal   Collection Time: 05/07/21  9:26 PM  Result Value Ref Range   Glucose-Capillary 128 (H) 70 - 99 mg/dL    Comment: Glucose reference range applies only to samples taken after fasting for at least 8 hours.  Basic metabolic panel     Status: Abnormal   Collection Time: 05/08/21  2:46 AM  Result Value Ref Range    Sodium 139 135 - 145 mmol/L   Potassium 3.7 3.5 - 5.1 mmol/L   Chloride 107 98 - 111 mmol/L   CO2 21 (L) 22 - 32 mmol/L   Glucose, Bld 153 (H) 70 - 99 mg/dL    Comment: Glucose reference range applies only to samples taken after fasting for at least 8 hours.   BUN 32 (H) 8 - 23 mg/dL   Creatinine, Ser 2.93 (H) 0.61 - 1.24 mg/dL   Calcium 7.0 (L) 8.9 - 10.3 mg/dL   GFR, Estimated 23 (L) >60 mL/min    Comment: (NOTE) Calculated using the CKD-EPI Creatinine Equation (2021)    Anion gap 11 5 - 15  Comment: Performed at [facility], [place] [address] [place], [place] [address]  Magnesium     Status: None   Collection Time: 05/08/21  2:46 AM  Result Value Ref Range   Magnesium 1.8 1.7 - 2.4 mg/dL    Comment: Performed at [facility] [address] [place], [place] [address]  Glucose, capillary     Status: None   Collection Time: 05/08/21  7:35 AM  Result Value Ref Range   Glucose-Capillary 84 70 - 99 mg/dL    Comment: Glucose reference range applies only to samples taken after fasting for at least 8 hours.  Glucose, capillary     Status: None   Collection Time: 05/08/21 12:22 PM  Result Value Ref Range   Glucose-Capillary 96 70 - 99 mg/dL    Comment: Glucose reference range applies only to samples taken after fasting for at least 8 hours.  Glucose, capillary     Status: None   Collection Time: 05/08/21  4:39 PM  Result Value Ref Range   Glucose-Capillary 85 70 - 99 mg/dL    Comment: Glucose reference range applies only to samples taken after fasting for at least 8 hours.  Glucose, capillary     Status: Abnormal   Collection Time: 05/08/21 10:09 PM  Result Value Ref Range   Glucose-Capillary 122 (H) 70 - 99 mg/dL    Comment: Glucose reference range applies only to samples taken after fasting for at least 8 hours.  Basic metabolic panel     Status: Abnormal   Collection Time: 05/09/21  2:00 AM  Result Value Ref Range   Sodium 141 135 - 145 mmol/L   Potassium 3.6  3.5 - 5.1 mmol/L   Chloride 106 98 - 111 mmol/L   CO2 22 22 - 32 mmol/L   Glucose, Bld 91 70 - 99 mg/dL    Comment: Glucose reference range applies only to samples taken after fasting for at least 8 hours.   BUN 32 (H) 8 - 23 mg/dL   Creatinine, Ser 3.12 (H) 0.61 - 1.24 mg/dL   Calcium 6.8 (L) 8.9 - 10.3 mg/dL   GFR, Estimated 21 (L) >60 mL/min    Comment: (NOTE) Calculated using the CKD-EPI Creatinine Equation (2021)    Anion gap 13 5 - 15    Comment: Performed at [facility] [address], [place], [place] [address]  Glucose, capillary     Status: Abnormal   Collection Time: 05/09/21  6:35 AM  Result Value Ref Range   Glucose-Capillary 101 (H) 70 - 99 mg/dL    Comment: Glucose reference range applies only to samples taken after fasting for at least 8 hours.  Glucose, capillary     Status: Abnormal   Collection Time: 05/09/21  8:00 AM  Result Value Ref Range   Glucose-Capillary 111 (H) 70 - 99 mg/dL    Comment: Glucose reference range applies only to samples taken after fasting for at least 8 hours.  Glucose, capillary     Status: Abnormal   Collection Time: 05/09/21 11:36 AM  Result Value Ref Range   Glucose-Capillary 117 (H) 70 - 99 mg/dL    Comment: Glucose reference range applies only to samples taken after fasting for at least 8 hours.  Glucose, capillary     Status: Abnormal   Collection Time: 05/09/21  3:43 PM  Result Value Ref Range   Glucose-Capillary 132 (H) 70 - 99 mg/dL    Comment: Glucose reference range  applies only to samples taken after fasting for at least 8 hours.  Glucose, capillary     Status: Abnormal   Collection Time: 05/09/21 10:26 PM  Result Value Ref Range   Glucose-Capillary 107 (H) 70 - 99 mg/dL    Comment: Glucose reference range applies only to samples taken after fasting for at least 8 hours.  CBC     Status: Abnormal   Collection Time: 05/10/21  2:26 AM  Result Value Ref Range   WBC 11.5 (H) 4.0 - 10.5 K/uL   RBC 3.32 (L) 4.22 -  5.81 MIL/uL   Hemoglobin 9.1 (L) 13.0 - 17.0 g/dL   HCT 28.5 (L) 39.0 - 52.0 %   MCV 85.8 80.0 - 100.0 fL   MCH 27.4 26.0 - 34.0 pg   MCHC 31.9 30.0 - 36.0 g/dL   RDW 14.6 11.5 - 15.5 %   Platelets 549 (H) 150 - 400 K/uL   nRBC 0.0 0.0 - 0.2 %    Comment: Performed at [facility] [address], [place], [place] [address]  Renal function panel     Status: Abnormal   Collection Time: 05/10/21  2:26 AM  Result Value Ref Range   Sodium 140 135 - 145 mmol/L   Potassium 3.3 (L) 3.5 - 5.1 mmol/L   Chloride 108 98 - 111 mmol/L   CO2 21 (L) 22 - 32 mmol/L   Glucose, Bld 123 (H) 70 - 99 mg/dL    Comment: Glucose reference range applies only to samples taken after fasting for at least 8 hours.   BUN 32 (H) 8 - 23 mg/dL   Creatinine, Ser 3.06 (H) 0.61 - 1.24 mg/dL   Calcium 7.0 (L) 8.9 - 10.3 mg/dL   Phosphorus 5.5 (H) 2.5 - 4.6 mg/dL   Albumin 2.6 (L) 3.5 - 5.0 g/dL   GFR, Estimated 22 (L) >60 mL/min    Comment: (NOTE) Calculated using the CKD-EPI Creatinine Equation (2021)    Anion gap 11 5 - 15    Comment: Performed at [facility] [address] [place], [place] [address]  Glucose, capillary     Status: Abnormal   Collection Time: 05/10/21  7:54 AM  Result Value Ref Range   Glucose-Capillary 104 (H) 70 - 99 mg/dL    Comment: Glucose reference range applies only to samples taken after fasting for at least 8 hours.  Glucose, capillary     Status: Abnormal   Collection Time: 05/10/21 11:26 AM  Result Value Ref Range   Glucose-Capillary 162 (H) 70 - 99 mg/dL    Comment: Glucose reference range applies only to samples taken after fasting for at least 8 hours.  Glucose, capillary     Status: Abnormal   Collection Time: 05/10/21  4:42 PM  Result Value Ref Range   Glucose-Capillary 136 (H) 70 - 99 mg/dL    Comment: Glucose reference range applies only to samples taken after fasting for at least 8 hours.  SARS CORONAVIRUS 2 (TAT 6-24 HRS) Nasopharyngeal Nasopharyngeal  Swab     Status: None   Collection Time: 05/10/21  5:11 PM   Specimen: Nasopharyngeal Swab  Result Value Ref Range   SARS Coronavirus 2 NEGATIVE NEGATIVE    Comment: (NOTE) SARS-CoV-2 target nucleic acids are NOT DETECTED.  The SARS-CoV-2 RNA is generally detectable in upper and lower respiratory specimens during the acute phase of infection. Negative results do not preclude SARS-CoV-2 infection, do not rule out co-infections with other pathogens,  and should not be used as the sole basis for treatment or other patient management decisions. Negative results must be combined with clinical observations, patient history, and epidemiological information. The expected result is Negative.  Fact Sheet for Patients: SugarRoll.be  Fact Sheet for Healthcare Providers: https://www.woods-mathews.com/  This test is not yet approved or cleared by the [place] FDA and  has been authorized for detection and/or diagnosis of SARS-CoV-2 by FDA under an Emergency Use Authorization (EUA). This EUA will remain  in effect (meaning this test can be used) for the duration of the COVID-19 declaration under Se ction 564(b)(1) of the Act, 21 U.S.C. section 360bbb-3(b)(1), unless the authorization is terminated or revoked sooner.  Performed at [facility], [place] [address], [place], [place] [address]   Glucose, capillary     Status: Abnormal   Collection Time: 05/10/21  9:16 PM  Result Value Ref Range   Glucose-Capillary 177 (H) 70 - 99 mg/dL    Comment: Glucose reference range applies only to samples taken after fasting for at least 8 hours.  Basic metabolic panel     Status: Abnormal   Collection Time: 05/11/21 12:59 AM  Result Value Ref Range   Sodium 138 135 - 145 mmol/L   Potassium 3.7 3.5 - 5.1 mmol/L   Chloride 108 98 - 111 mmol/L   CO2 19 (L) 22 - 32 mmol/L   Glucose, Bld 95 70 - 99 mg/dL    Comment: Glucose reference range applies only to  samples taken after fasting for at least 8 hours.   BUN 31 (H) 8 - 23 mg/dL   Creatinine, Ser 2.98 (H) 0.61 - 1.24 mg/dL   Calcium 7.1 (L) 8.9 - 10.3 mg/dL   GFR, Estimated 22 (L) >60 mL/min    Comment: (NOTE) Calculated using the CKD-EPI Creatinine Equation (2021)    Anion gap 11 5 - 15    Comment: Performed at [facility] [address], [place], [place] [address]  Glucose, capillary     Status: Abnormal   Collection Time: 05/11/21  7:41 AM  Result Value Ref Range   Glucose-Capillary 107 (H) 70 - 99 mg/dL    Comment: Glucose reference range applies only to samples taken after fasting for at least 8 hours.  Glucose, capillary     Status: Abnormal   Collection Time: 05/11/21 11:10 AM  Result Value Ref Range   Glucose-Capillary 135 (H) 70 - 99 mg/dL    Comment: Glucose reference range applies only to samples taken after fasting for at least 8 hours.  Comprehensive metabolic panel     Status: Abnormal   Collection Time: 05/18/21 11:10 AM  Result Value Ref Range   Glucose 113 (H) 70 - 99 mg/dL   BUN 26 8 - 27 mg/dL   Creatinine, Ser 2.06 (H) 0.76 - 1.27 mg/dL   eGFR 35 (L) >59 mL/min/1.73   BUN/Creatinine Ratio 13 10 - 24   Sodium 140 134 - 144 mmol/L   Potassium 4.2 3.5 - 5.2 mmol/L   Chloride 111 (H) 96 - 106 mmol/L   CO2 17 (L) 20 - 29 mmol/L   Calcium 8.5 (L) 8.6 - 10.2 mg/dL   Total Protein 6.6 6.0 - 8.5 g/dL   Albumin 3.3 (L) 3.8 - 4.8 g/dL   Globulin, Total 3.3 1.5 - 4.5 g/dL   Albumin/Globulin Ratio 1.0 (L) 1.2 - 2.2   Bilirubin Total 0.3 0.0 - 1.2 mg/dL   Alkaline Phosphatase 128 (H) 44 -  121 IU/L   AST 19 0 - 40 IU/L   ALT 14 0 - 44 IU/L  CBC     Status: Abnormal   Collection Time: 05/18/21 11:10 AM  Result Value Ref Range   WBC 14.9 (H) 3.4 - 10.8 x10E3/uL   RBC 3.26 (L) 4.14 - 5.80 x10E6/uL   Hemoglobin 9.1 (L) 13.0 - 17.7 g/dL   Hematocrit 27.5 (L) 37.5 - 51.0 %   MCV 84 79 - 97 fL   MCH 27.9 26.6 - 33.0 pg   MCHC 33.1 31.5 - 35.7 g/dL   RDW 13.5  11.6 - 15.4 %   Platelets 706 (H) 150 - 450 x10E3/uL  TSH     Status: Abnormal   Collection Time: 05/18/21 11:10 AM  Result Value Ref Range   TSH 5.550 (H) 0.450 - 4.500 uIU/mL  Basic metabolic panel     Status: Abnormal   Collection Time: 05/27/21  4:27 AM  Result Value Ref Range   Sodium 139 135 - 145 mmol/L   Potassium 4.0 3.5 - 5.1 mmol/L   Chloride 111 98 - 111 mmol/L   CO2 17 (L) 22 - 32 mmol/L   Glucose, Bld 155 (H) 70 - 99 mg/dL    Comment: Glucose reference range applies only to samples taken after fasting for at least 8 hours.   BUN 22 8 - 23 mg/dL   Creatinine, Ser 1.99 (H) 0.61 - 1.24 mg/dL   Calcium 7.8 (L) 8.9 - 10.3 mg/dL   GFR, Estimated 36 (L) >60 mL/min    Comment: (NOTE) Calculated using the CKD-EPI Creatinine Equation (2021)    Anion gap 11 5 - 15    Comment: Performed at [facility], [address], [place], [place] [address]  Brain natriuretic peptide     Status: Abnormal   Collection Time: 05/27/21  4:27 AM  Result Value Ref Range   B Natriuretic Peptide 753.0 (H) 0.0 - 100.0 pg/mL    Comment: Performed at [facility], [address] [place], [place] [address]  CBC with Differential     Status: Abnormal   Collection Time: 05/27/21  4:27 AM  Result Value Ref Range   WBC 15.5 (H) 4.0 - 10.5 K/uL   RBC 3.60 (L) 4.22 - 5.81 MIL/uL   Hemoglobin 10.0 (L) 13.0 - 17.0 g/dL   HCT 32.4 (L) 39.0 - 52.0 %   MCV 90.0 80.0 - 100.0 fL   MCH 27.8 26.0 - 34.0 pg   MCHC 30.9 30.0 - 36.0 g/dL   RDW 15.1 11.5 - 15.5 %   Platelets 669 (H) 150 - 400 K/uL   nRBC 0.0 0.0 - 0.2 %   Neutrophils Relative % 72 %   Neutro Abs 11.3 (H) 1.7 - 7.7 K/uL   Lymphocytes Relative 19 %   Lymphs Abs 3.0 0.7 - 4.0 K/uL   Monocytes Relative 5 %   Monocytes Absolute 0.7 0.1 - 1.0 K/uL   Eosinophils Relative 2 %   Eosinophils Absolute 0.2 0.0 - 0.5 K/uL   Basophils Relative 1 %   Basophils Absolute 0.1 0.0 - 0.1 K/uL   Immature Granulocytes 1 %   Abs Immature Granulocytes 0.20 (H)  0.00 - 0.07 K/uL    Comment: Performed at [facility], [address] [place], [place] [address]  Troponin I (High Sensitivity)     Status: Abnormal   Collection Time: 05/27/21  4:27 AM  Result Value Ref Range   Troponin I (High Sensitivity) 24 (H) <  18 ng/L    Comment: (NOTE) Elevated high sensitivity troponin I (hsTnI) values and significant  changes across serial measurements may suggest ACS but many other  chronic and acute conditions are known to elevate hsTnI results.  Refer to the "Links" section for chest pain algorithms and additional  guidance. Performed at [facility], [address] [place], [place] [address]   Magnesium     Status: None   Collection Time: 05/27/21  4:27 AM  Result Value Ref Range   Magnesium 1.7 1.7 - 2.4 mg/dL    Comment: Performed at [facility], [address] [place], [place] [address]  Resp Panel by RT-PCR (Flu A&B, Covid) Nasopharyngeal Swab     Status: None   Collection Time: 05/27/21  5:54 AM   Specimen: Nasopharyngeal Swab; Nasopharyngeal(NP) swabs in vial transport medium  Result Value Ref Range   SARS Coronavirus 2 by RT PCR NEGATIVE NEGATIVE    Comment: (NOTE) SARS-CoV-2 target nucleic acids are NOT DETECTED.  The SARS-CoV-2 RNA is generally detectable in upper respiratory specimens during the acute phase of infection. The lowest concentration of SARS-CoV-2 viral copies this assay can detect is 138 copies/mL. A negative result does not preclude SARS-Cov-2 infection and should not be used as the sole basis for treatment or other patient management decisions. A negative result may occur with  improper specimen collection/handling, submission of specimen other than nasopharyngeal swab, presence of viral mutation(s) within the areas targeted by this assay, and inadequate number of viral copies(<138 copies/mL). A negative result must be combined with clinical observations, patient history, and epidemiological information. The expected  result is Negative.  Fact Sheet for Patients:  EntrepreneurPulse.com.au  Fact Sheet for Healthcare Providers:  IncredibleEmployment.be  This test is no t yet approved or cleared by the [place] FDA and  has been authorized for detection and/or diagnosis of SARS-CoV-2 by FDA under an Emergency Use Authorization (EUA). This EUA will remain  in effect (meaning this test can be used) for the duration of the COVID-19 declaration under Section 564(b)(1) of the Act, 21 U.S.C.section 360bbb-3(b)(1), unless the authorization is terminated  or revoked sooner.       Influenza A by PCR NEGATIVE NEGATIVE   Influenza B by PCR NEGATIVE NEGATIVE    Comment: (NOTE) The Xpert Xpress SARS-CoV-2/FLU/RSV plus assay is intended as an aid in the diagnosis of influenza from Nasopharyngeal swab specimens and should not be used as a sole basis for treatment. Nasal washings and aspirates are unacceptable for Xpert Xpress SARS-CoV-2/FLU/RSV testing.  Fact Sheet for Patients: EntrepreneurPulse.com.au  Fact Sheet for Healthcare Providers: IncredibleEmployment.be  This test is not yet approved or cleared by the [place] FDA and has been authorized for detection and/or diagnosis of SARS-CoV-2 by FDA under an Emergency Use Authorization (EUA). This EUA will remain in effect (meaning this test can be used) for the duration of the COVID-19 declaration under Section 564(b)(1) of the Act, 21 U.S.C. section 360bbb-3(b)(1), unless the authorization is terminated or revoked.  Performed at [facility], [address], [place], [place] [address]   Troponin I (High Sensitivity)     Status: Abnormal   Collection Time: 05/27/21  6:36 AM  Result Value Ref Range   Troponin I (High Sensitivity) 27 (H) <18 ng/L    Comment: (NOTE) Elevated high sensitivity troponin I (hsTnI) values and significant  changes across serial measurements may  suggest ACS but many other  chronic and acute conditions are known to elevate hsTnI results.  Refer to the "Links" section for chest pain algorithms and additional  guidance. Performed at [facility], [address] [place], [place] [address]   Procalcitonin - Baseline     Status: None   Collection Time: 05/27/21  6:36 AM  Result Value Ref Range   Procalcitonin 0.23 ng/mL    Comment:        Interpretation: PCT (Procalcitonin) <= 0.5 ng/mL: Systemic infection (sepsis) is not likely. Local bacterial infection is possible. (NOTE)       Sepsis PCT Algorithm           Lower Respiratory Tract                                      Infection PCT Algorithm    ----------------------------     ----------------------------         PCT < 0.25 ng/mL                PCT < 0.10 ng/mL          Strongly encourage             Strongly discourage   discontinuation of antibiotics    initiation of antibiotics    ----------------------------     -----------------------------       PCT 0.25 - 0.50 ng/mL            PCT 0.10 - 0.25 ng/mL               OR       >80% decrease in PCT            Discourage initiation of                                            antibiotics      Encourage discontinuation           of antibiotics    ----------------------------     -----------------------------         PCT >= 0.50 ng/mL              PCT 0.26 - 0.50 ng/mL               AND        <80% decrease in PCT             Encourage initiation of                                             antibiotics       Encourage continuation           of antibiotics    ----------------------------     -----------------------------        PCT >= 0.50 ng/mL                  PCT > 0.50 ng/mL               AND         increase in PCT                  Strongly encourage  initiation of antibiotics    Strongly encourage escalation           of antibiotics                                      -----------------------------                                           PCT <= 0.25 ng/mL                                                 OR                                        > 80% decrease in PCT                                      Discontinue / Do not initiate                                             antibiotics  Performed at [facility], [address], [place], [place] [address]   Troponin I (High Sensitivity)     Status: Abnormal   Collection Time: 05/27/21  9:43 AM  Result Value Ref Range   Troponin I (High Sensitivity) 29 (H) <18 ng/L    Comment: (NOTE) Elevated high sensitivity troponin I (hsTnI) values and significant  changes across serial measurements may suggest ACS but many other  chronic and acute conditions are known to elevate hsTnI results.  Refer to the "Links" section for chest pain algorithms and additional  guidance. Performed at [facility], [address], [place], [place] [address]   Troponin I (High Sensitivity)     Status: Abnormal   Collection Time: 05/27/21 10:52 AM  Result Value Ref Range   Troponin I (High Sensitivity) 31 (H) <18 ng/L    Comment: (NOTE) Elevated high sensitivity troponin I (hsTnI) values and significant  changes across serial measurements may suggest ACS but many other  chronic and acute conditions are known to elevate hsTnI results.  Refer to the "Links" section for chest pain algorithms and additional  guidance. Performed at [facility], [address] [place], [place] [address]   CBG monitoring, ED     Status: Abnormal   Collection Time: 05/27/21 11:28 AM  Result Value Ref Range   Glucose-Capillary 121 (H) 70 - 99 mg/dL    Comment: Glucose reference range applies only to samples taken after fasting for at least 8 hours.  CBG monitoring, ED     Status: Abnormal   Collection Time: 05/27/21  6:31 PM  Result Value Ref Range   Glucose-Capillary 210 (H) 70 - 99 mg/dL    Comment: Glucose reference range applies only to  samples taken after fasting for at least 8 hours.  Glucose, capillary     Status: Abnormal  Collection Time: 05/27/21  8:15 PM  Result Value Ref Range   Glucose-Capillary 189 (H) 70 - 99 mg/dL    Comment: Glucose reference range applies only to samples taken after fasting for at least 8 hours.   Comment 1 Notify RN    Comment 2 Document in Chart   CBC     Status: Abnormal   Collection Time: 05/28/21  6:05 AM  Result Value Ref Range   WBC 12.5 (H) 4.0 - 10.5 K/uL   RBC 3.39 (L) 4.22 - 5.81 MIL/uL   Hemoglobin 9.2 (L) 13.0 - 17.0 g/dL   HCT 29.6 (L) 39.0 - 52.0 %   MCV 87.3 80.0 - 100.0 fL   MCH 27.1 26.0 - 34.0 pg   MCHC 31.1 30.0 - 36.0 g/dL   RDW 15.0 11.5 - 15.5 %   Platelets 617 (H) 150 - 400 K/uL   nRBC 0.0 0.0 - 0.2 %    Comment: Performed at [facility] [address] [place], [place] [address]  Basic metabolic panel     Status: Abnormal   Collection Time: 05/28/21  6:05 AM  Result Value Ref Range   Sodium 142 135 - 145 mmol/L   Potassium 3.4 (L) 3.5 - 5.1 mmol/L   Chloride 110 98 - 111 mmol/L   CO2 20 (L) 22 - 32 mmol/L   Glucose, Bld 157 (H) 70 - 99 mg/dL    Comment: Glucose reference range applies only to samples taken after fasting for at least 8 hours.   BUN 29 (H) 8 - 23 mg/dL   Creatinine, Ser 2.07 (H) 0.61 - 1.24 mg/dL   Calcium 7.8 (L) 8.9 - 10.3 mg/dL   GFR, Estimated 35 (L) >60 mL/min    Comment: (NOTE) Calculated using the CKD-EPI Creatinine Equation (2021)    Anion gap 12 5 - 15    Comment: Performed at [facility], [address], [place], [place] [address]  Glucose, capillary     Status: Abnormal   Collection Time: 05/28/21  7:19 AM  Result Value Ref Range   Glucose-Capillary 132 (H) 70 - 99 mg/dL    Comment: Glucose reference range applies only to samples taken after fasting for at least 8 hours.  Glucose, capillary     Status: Abnormal   Collection Time: 05/28/21 11:12 AM  Result Value Ref Range   Glucose-Capillary 274 (H) 70 - 99 mg/dL     Comment: Glucose reference range applies only to samples taken after fasting for at least 8 hours.  Glucose, capillary     Status: Abnormal   Collection Time: 05/28/21  4:19 PM  Result Value Ref Range   Glucose-Capillary 189 (H) 70 - 99 mg/dL    Comment: Glucose reference range applies only to samples taken after fasting for at least 8 hours.  Glucose, capillary     Status: Abnormal   Collection Time: 05/28/21  9:46 PM  Result Value Ref Range   Glucose-Capillary 237 (H) 70 - 99 mg/dL    Comment: Glucose reference range applies only to samples taken after fasting for at least 8 hours.   Comment 1 Notify RN    Comment 2 Document in Chart   Glucose, capillary     Status: Abnormal   Collection Time: 05/29/21  7:12 AM  Result Value Ref Range   Glucose-Capillary 262 (H) 70 - 99 mg/dL    Comment: Glucose reference range applies only to samples taken after fasting for at least 8 hours.  Assessment & Plan:   1) Type 2 diabetes mellitus with stage 4 chronic kidney disease, with long-term current use of insulin (HCC)  - Todd [name] has currently uncontrolled symptomatic type 2 DM since 65 years of age.  He presents today, accompanied by care attendant from the nursing home, with his logs showing fluctuating but mostly high glycemic profile overall.  His previsit A1c, checked during a recent hospitalization for CHF exacerbation was 7.9% on 11/8.  He has only been on Humalog SSI for diabetes management (somehow during his admission to the nursing home, his basal insulin got dropped?).  -This patient has history of heavy alcohol use/abuse which might have contributed to the pathogenesis of his diabetes. -I had a long discussion with him about the progressive nature of type 2 diabetes and the pathology behind its complications.   -his diabetes is complicated by peripheral arterial disease status post bilateral above-knee amputation -sedentary life, renal insufficiency, and he remains at  extremely high risk for more acute and chronic complications which include CAD, CVA, CKD, retinopathy, and neuropathy. These are all discussed in detail with him.  - Nutritional counseling repeated at each appointment due to patients tendency to fall back in to old habits.  - The patient admits there is a room for improvement in their diet and drink choices. -  Suggestion is made for the patient to avoid simple carbohydrates from their diet including Cakes, Sweet Desserts / Pastries, Ice Cream, Soda (diet and regular), Sweet Tea, Candies, Chips, Cookies, Sweet Pastries, Store Bought Juices, Alcohol in Excess of 1-2 drinks a day, Artificial Sweeteners, Coffee Creamer, and "Sugar-free" Products. This will help patient to have stable blood glucose profile and potentially avoid unintended weight gain.   - I encouraged the patient to switch to unprocessed or minimally processed complex starch and increased protein intake (animal or plant source), fruits, and vegetables.   - Patient is advised to stick to a routine mealtimes to eat 3 meals a day and avoid unnecessary snacks (to snack only to correct hypoglycemia).  - I have approached him with the following individualized plan to manage diabetes and patient agrees:   -#1 priority in his care will be to avoid hypoglycemia.  -I discussed and reinitiated Lantus 20 units SQ nightly and adjusted his Humalog to 4-10 units SQ TID with meals if glucose is above 90 and he is eating (Specific instructions on how to titrate insulin dosage based on glucose readings given to patient in writing).   -He is encouraged to continue monitoring blood glucose 4 times daily, before meals and before bed, and call the clinic if readings are less than 70 or greater than 200 for 3 tests in a row.  -In this patient with high likelihood of pancreatic diabetes, tight glycemic profile is not advised as he may be lacking endogenous glucagon response which puts him at high risk for  hypoglycemia.  - he is not a candidate for Metformin, SGLT2 inhibitors due to concurrent renal insufficiency. -He is not a candidate for incretin therapy due to his background history of alcoholism with possible risk of pancreatitis.  - Patient specific target  A1c;  LDL, HDL, Triglycerides, and  Waist Circumference were discussed in detail.  2) Blood Pressure /Hypertension: -His blood pressure is controlled to target. He is advised to continue Metoprolol 100 mg po daily, Hydralazine 25 mg TID and Lasix 40 mg po daily.  3) Lipids/Hyperlipidemia: His most recent lipid panel from 09/22/20 shows controlled LDL of 19.  He is advised to continue Lipitor 40 mg po daily at bedtime.  Side effects and precautions discussed with him.  He is advised to avoid fried foods and butter.  4)  Weight/Diet: -   His There is no height or weight on file to calculate BMI.  CDE Consult has been initiated . Exercise, and detailed carbohydrates information provided  -  detailed on discharge instructions.  He cannot exercise optimally, wheelchair-bound due to bilateral above-knee amputations.  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  His previsit thyroid function tests are consistent with under-replacement however only TSH was drawn.  For safety purposes, he is advised to continue Levothyroxine 100 mg po daily before breakfast for now.    - We discussed about the correct intake of his thyroid hormone, on empty stomach at fasting, with water, separated by at least 30 minutes from breakfast and other medications,  and separated by more than 4 hours from calcium, iron, multivitamins, acid reflux medications (PPIs). -Patient is made aware of the fact that thyroid hormone replacement is needed for life, dose to be adjusted by periodic monitoring of thyroid function tests.  6) Vitamin D deficiency: -Last vitamin D level on 08/06/20 was 52.3.  He  is advised to continue vitamin D3 5000 units daily as maintenance dose until next measurement.  7) Chronic Care/Health Maintenance: -he is on statin medications and is encouraged to initiate and continue to follow up with Ophthalmology, Dentist,  Podiatrist at least yearly or according to recommendations, and advised to stay away from smoking. I have recommended yearly flu vaccine and pneumonia vaccine at least every 5 years;  and  sleep for at least 7 hours a day. -He cannot exercise optimally.   - he is  advised to maintain close follow up with Pcp, No for primary care needs, as well as his other providers for optimal and coordinated care.     I spent 45 minutes in the care of the patient today including review of labs from [place], Lipids, Thyroid Function, Hematology (current and previous including abstractions from other facilities); face-to-face time discussing  his blood glucose readings/logs, discussing hypoglycemia and hyperglycemia episodes and symptoms, medications doses, his options of short and long term treatment based on the latest standards of care / guidelines;  discussion about incorporating lifestyle medicine;  and documenting the encounter.    Please refer to Patient Instructions for Blood Glucose Monitoring and Insulin/Medications Dosing Guide"  in media tab for additional information. Please  also refer to " Patient Self Inventory" in the Media  tab for reviewed elements of pertinent patient history.  [name] participated in the discussions, expressed understanding, and voiced agreement with the above plans.  All questions were answered to his satisfaction. he is encouraged to contact clinic should he have any questions or concerns prior to his return visit.   Follow up plan: - Return in about 3 months (around 09/01/2021) for Diabetes F/U- A1c and UM in office, Thyroid follow up, Previsit labs, Bring meter and logs.  Rayetta Pigg, [facility] [address] [place], [place] [address] Phone: [phone] Fax: [phone]  06/03/2021, 3:43 PM

## 2021-06-04 NOTE — Progress Notes (Signed)
Donato Heinz, MD Lorenza Evangelist, RN Yes his eGFR was 35 so ok for mri with contrast     Previous Messages   ----- Message ----- From: Lorenza Evangelist, RN Sent: 06/04/2021   8:48 AM EST To: Donato Heinz, MD, Elmarie Shiley, MD, * Subject: neph clearance for cardiac MRI with gadolini*   [facility] Team, This patient is scheduled for a cardiac MRI (w/ gad) on Thursday 06/10/21 and I am seeking clearance from a renal standpoint to proceed with the test. I wasn't sure who the primary nephrologist was who cares for this patient but found all your names in his chart. I know this is late notice and some of you may be off on christmas vacation but I thought i'd give it a shot. Thank you and merry christmas! Marchia Bond RN Navigator Cardiac Imaging [facility] Services [phone] Office [phone] Cell

## 2021-06-07 DIAGNOSIS — I10 Essential (primary) hypertension: Secondary | ICD-10-CM | POA: Diagnosis not present

## 2021-06-07 DIAGNOSIS — E039 Hypothyroidism, unspecified: Secondary | ICD-10-CM | POA: Diagnosis not present

## 2021-06-07 DIAGNOSIS — E1169 Type 2 diabetes mellitus with other specified complication: Secondary | ICD-10-CM | POA: Diagnosis not present

## 2021-06-07 DIAGNOSIS — R5381 Other malaise: Secondary | ICD-10-CM | POA: Diagnosis not present

## 2021-06-07 DIAGNOSIS — Z89612 Acquired absence of left leg above knee: Secondary | ICD-10-CM | POA: Diagnosis not present

## 2021-06-07 DIAGNOSIS — I4891 Unspecified atrial fibrillation: Secondary | ICD-10-CM | POA: Diagnosis not present

## 2021-06-07 DIAGNOSIS — F1910 Other psychoactive substance abuse, uncomplicated: Secondary | ICD-10-CM | POA: Diagnosis not present

## 2021-06-07 DIAGNOSIS — Z89611 Acquired absence of right leg above knee: Secondary | ICD-10-CM | POA: Diagnosis not present

## 2021-06-09 ENCOUNTER — Telehealth (HOSPITAL_COMMUNITY): Payer: Self-pay | Admitting: *Deleted

## 2021-06-09 NOTE — Telephone Encounter (Signed)
Reaching out to patient to offer assistance regarding upcoming cardiac imaging study; pt verbalizes understanding of appt date/time, parking situation and where to check in; name and call back number provided for further questions should they arise  Gordy Clement RN Navigator Cardiac [facility] and Vascular [phone] office [phone] cell  Caregiver denies claustrophobia or metal.

## 2021-06-09 NOTE — Telephone Encounter (Signed)
Attempted to call patient regarding upcoming cardiac MRI appointment. Patient did not pick up phone and I was not able to leave a message.  Gordy Clement RN Navigator Cardiac Imaging [facility] Heart and Vascular Services [phone] Office [phone] Cell

## 2021-06-10 ENCOUNTER — Ambulatory Visit (HOSPITAL_COMMUNITY)
Admission: RE | Admit: 2021-06-10 | Discharge: 2021-06-10 | Disposition: A | Discharge status: Home / Self Care | Payer: Medicare HMO | Source: Ambulatory Visit | Attending: Student | Admitting: Student

## 2021-06-10 DIAGNOSIS — F321 Major depressive disorder, single episode, moderate: Secondary | ICD-10-CM | POA: Diagnosis not present

## 2021-06-10 DIAGNOSIS — F17220 Nicotine dependence, chewing tobacco, uncomplicated: Secondary | ICD-10-CM | POA: Diagnosis not present

## 2021-06-10 DIAGNOSIS — F1410 Cocaine abuse, uncomplicated: Secondary | ICD-10-CM | POA: Diagnosis not present

## 2021-06-11 DIAGNOSIS — Z111 Encounter for screening for respiratory tuberculosis: Secondary | ICD-10-CM | POA: Diagnosis not present

## 2021-06-15 ENCOUNTER — Ambulatory Visit (HOSPITAL_COMMUNITY): Admission: RE | Admit: 2021-06-15 | Payer: Medicare HMO | Source: Ambulatory Visit

## 2021-06-25 DIAGNOSIS — D631 Anemia in chronic kidney disease: Secondary | ICD-10-CM | POA: Diagnosis not present

## 2021-06-25 DIAGNOSIS — N1832 Chronic kidney disease, stage 3b: Secondary | ICD-10-CM | POA: Diagnosis not present

## 2021-06-25 DIAGNOSIS — I129 Hypertensive chronic kidney disease with stage 1 through stage 4 chronic kidney disease, or unspecified chronic kidney disease: Secondary | ICD-10-CM | POA: Diagnosis not present

## 2021-06-25 DIAGNOSIS — I5032 Chronic diastolic (congestive) heart failure: Secondary | ICD-10-CM | POA: Diagnosis not present

## 2021-06-25 DIAGNOSIS — N189 Chronic kidney disease, unspecified: Secondary | ICD-10-CM | POA: Diagnosis not present

## 2021-06-25 DIAGNOSIS — I480 Paroxysmal atrial fibrillation: Secondary | ICD-10-CM | POA: Diagnosis not present

## 2021-06-30 DIAGNOSIS — F321 Major depressive disorder, single episode, moderate: Secondary | ICD-10-CM | POA: Diagnosis not present

## 2021-06-30 DIAGNOSIS — F418 Other specified anxiety disorders: Secondary | ICD-10-CM | POA: Diagnosis not present

## 2021-06-30 DIAGNOSIS — F17220 Nicotine dependence, chewing tobacco, uncomplicated: Secondary | ICD-10-CM | POA: Diagnosis not present

## 2021-06-30 DIAGNOSIS — F14222 Cocaine dependence with intoxication with perceptual disturbance: Secondary | ICD-10-CM | POA: Diagnosis not present

## 2021-06-30 DIAGNOSIS — F1020 Alcohol dependence, uncomplicated: Secondary | ICD-10-CM | POA: Diagnosis not present

## 2021-06-30 DIAGNOSIS — F17200 Nicotine dependence, unspecified, uncomplicated: Secondary | ICD-10-CM | POA: Diagnosis not present

## 2021-06-30 DIAGNOSIS — F330 Major depressive disorder, recurrent, mild: Secondary | ICD-10-CM | POA: Diagnosis not present

## 2021-06-30 DIAGNOSIS — F1410 Cocaine abuse, uncomplicated: Secondary | ICD-10-CM | POA: Diagnosis not present

## 2021-07-09 ENCOUNTER — Ambulatory Visit (INDEPENDENT_AMBULATORY_CARE_PROVIDER_SITE_OTHER): Payer: Medicare HMO | Admitting: Cardiology

## 2021-07-09 ENCOUNTER — Encounter: Payer: Self-pay | Admitting: Cardiology

## 2021-07-09 ENCOUNTER — Other Ambulatory Visit: Payer: Self-pay

## 2021-07-09 VITALS — BP 138/74 | HR 67 | Ht 68.0 in

## 2021-07-09 DIAGNOSIS — I472 Ventricular tachycardia, unspecified: Secondary | ICD-10-CM | POA: Diagnosis not present

## 2021-07-09 DIAGNOSIS — R55 Syncope and collapse: Secondary | ICD-10-CM | POA: Diagnosis not present

## 2021-07-09 DIAGNOSIS — I4891 Unspecified atrial fibrillation: Secondary | ICD-10-CM | POA: Diagnosis not present

## 2021-07-09 NOTE — Progress Notes (Signed)
Electrophysiology Office Follow up Visit Note:    Date:  07/09/2021   ID:  Todd [name], DOB 1955-06-17, MRN [id number]  PCP:  Pcp, No  [facility] Cardiologist:  Minus Breeding, MD  [facility] Electrophysiologist:  [name], MD    Interval History:    Todd [name] is a 66 y.o. male who presents for a follow up visit.  They were last seen in clinic by Todd [name] on May 18, 2021.  The patient has a history of mild cardiomyopathy, paroxysmal atrial fibrillation, peripheral arterial disease, hypertension, hyperlipidemia, diabetes, polysubstance abuse, CKD 3.  He was admitted in November of last year and I evaluated him for sustained ventricular tachycardia.  At the appointment with Todd [name] said he was doing well.  He is taking amiodarone given his history of sustained ventricular tachycardia.  He was thought to be a very poor candidate for EP procedures such as ICD implant.  He is doing well.  He is currently at a rehab facility.     Past Medical History:  Diagnosis Date   AKI (acute kidney injury) ([place])    Constipated    Diabetes mellitus without complication ([place])    Diarrhea    Elevated LFTs    Goiter    Gout    Hyperlipidemia    Hypertension    Leukocytosis    Reactive thrombocytosis    Right BKA infection ([place]) 08/2016   Right leg pain    Sepsis due to undetermined organism [facility])    Thyroid disease    Wound infection after surgery 08/2016    Past Surgical History:  Procedure Laterality Date   ABDOMINAL AORTOGRAM N/A 08/11/2016   Procedure: Abdominal Aortogram;  Surgeon: Waynetta Sandy, MD;  Location: [place] [facility];  Service: Cardiovascular;  Laterality: N/A;   ABDOMINAL AORTOGRAM W/LOWER EXTREMITY N/A 08/15/2016   Procedure: Abdominal Aortogram w/Lower Extremity;  Surgeon: Elam Dutch, MD;  Location: [place] [facility];  Service: Cardiovascular;  Laterality: N/A;   AMPUTATION Right 08/17/2016   Procedure: RIGHT BELOW KNEE  AMPUTATION;  Surgeon: Elam Dutch, MD;  Location: [place];  Service: Vascular;  Laterality: Right;   AMPUTATION Right 09/12/2016   Procedure: AMPUTATION ABOVE KNEE;  Surgeon: Newt Minion, MD;  Location: [place];  Service: Orthopedics;  Laterality: Right;   AMPUTATION Left 08/12/2016   Procedure: LEFT BELOW KNEE AMPUTATION;  Surgeon: Newt Minion, MD;  Location: [place];  Service: Orthopedics;  Laterality: Left;   AMPUTATION Left 11/01/2017   Procedure: LEFT ABOVE KNEE AMPUTATION;  Surgeon: Newt Minion, MD;  Location: [place];  Service: Orthopedics;  Laterality: Left;   APPLICATION OF WOUND VAC Right 09/12/2016   Procedure: APPLICATION OF WOUND VAC ABOVE KNEE;  Surgeon: Newt Minion, MD;  Location: [place];  Service: Orthopedics;  Laterality: Right;   BIOPSY  01/13/2021   Procedure: BIOPSY;  Surgeon: Rogene Houston, MD;  [facility];  Service: Endoscopy;;  esophageal   COLONOSCOPY WITH PROPOFOL N/A 01/13/2021   Procedure: COLONOSCOPY WITH PROPOFOL;  Surgeon: Rogene Houston, MD;  [facility];  Service: Endoscopy;  Laterality: N/A;   ESOPHAGOGASTRODUODENOSCOPY (EGD) WITH PROPOFOL N/A 01/13/2021   Procedure: ESOPHAGOGASTRODUODENOSCOPY (EGD) WITH PROPOFOL;  Surgeon: Rogene Houston, MD;  [facility];  Service: Endoscopy;  Laterality: N/A;   LOWER EXTREMITY ANGIOGRAPHY Bilateral 08/11/2016   Procedure: Lower Extremity Angiography;  Surgeon: Waynetta Sandy, MD;  Location: [place] [facility];  Service: Cardiovascular;  Laterality: Bilateral;   PERIPHERAL VASCULAR BALLOON ANGIOPLASTY Left 08/11/2016   Procedure: Peripheral Vascular Balloon Angioplasty;  Surgeon: Waynetta Sandy, MD;  Location: [place] [facility];  Service: Cardiovascular;  Laterality: Left;  SFA   POLYPECTOMY  01/13/2021   Procedure: POLYPECTOMY;  Surgeon: Rogene Houston, MD;  [facility];  Service: Endoscopy;;   THYROID SURGERY      Current Medications: Current Meds   Medication Sig   acetaminophen (TYLENOL) 325 MG tablet Take 2 tablets (650 mg total) by mouth every 6 (six) hours as needed for mild pain (or Fever >/= 101).   allopurinol (ZYLOPRIM) 100 MG tablet Take 1 tablet (100 mg total) by mouth daily. (Patient taking differently: Take 300 mg by mouth daily.)   amiodarone (PACERONE) 200 MG tablet Take 1 tablet (200 mg total) by mouth daily.   amiodarone (PACERONE) 400 MG tablet Take 400 mg by mouth daily.   apixaban (ELIQUIS) 5 MG TABS tablet Take 1 tablet (5 mg total) by mouth 2 (two) times daily.   atorvastatin (LIPITOR) 40 MG tablet TAKE 1 TABLET BY MOUTH ONCE DAILY AT 6 PM (Patient taking differently: Take 40 mg by mouth daily.)   calcium carbonate (TUMS - DOSED IN MG ELEMENTAL CALCIUM) 500 MG chewable tablet Chew 2 tablets (400 mg of elemental calcium total) by mouth 2 (two) times daily with a meal. (Patient taking differently: Chew 2 tablets by mouth 3 (three) times daily with meals.)   cetirizine (ZYRTEC) 10 MG tablet Take 1 tablet (10 mg total) by mouth daily as needed for allergies. For allergy symptoms   cholecalciferol (VITAMIN D3) 25 MCG (1000 UNIT) tablet Take 1,000 Units by mouth daily.   folic acid (FOLVITE) 1 MG tablet Take 1 tablet (1 mg total) by mouth daily.   furosemide (LASIX) 40 MG tablet Take 1 tablet (40 mg total) by mouth daily.   glucose blood (ACCU-CHEK AVIVA PLUS) test strip Check blood sugars four times a day   hydrALAZINE (APRESOLINE) 25 MG tablet Take 1 tablet (25 mg total) by mouth 3 (three) times daily.   insulin glargine (LANTUS) 100 UNIT/ML Solostar Pen Inject 20 Units into the skin at bedtime.   insulin lispro (HUMALOG) 100 UNIT/ML KwikPen Inject 4-10 Units into the skin 3 (three) times daily. Sliding scale 200-250 2units, 251-300 4units, 301-350 6units, 351-400 8units,401-450 10units   isosorbide mononitrate (IMDUR) 30 MG 24 hr tablet Take 1 tablet (30 mg total) by mouth daily.   levothyroxine (SYNTHROID) 100 MCG tablet  Take 1 tablet (100 mcg total) by mouth daily before breakfast.   LORazepam (ATIVAN) 0.5 MG tablet Take 1 tablet (0.5 mg total) by mouth every 6 (six) hours as needed for anxiety.   megestrol (MEGACE) 400 MG/10ML suspension Take 10 mLs (400 mg total) by mouth 2 (two) times daily. For appetite stimulation   metoprolol succinate (TOPROL-XL) 100 MG 24 hr tablet Take 1 tablet (100 mg total) by mouth daily. Take with or immediately following a meal.   Multiple Vitamin (MULTIVITAMIN WITH MINERALS) TABS tablet Take 1 tablet by mouth daily.   pantoprazole (PROTONIX) 40 MG tablet Take 1 tablet (40 mg total) by mouth 2 (two) times daily.   polyethylene glycol (MIRALAX / GLYCOLAX) 17 g packet Take 17 g by mouth daily.   potassium chloride SA (KLOR-CON M) 20 MEQ tablet Take 1 tablet (20 mEq total) by mouth daily.   PROTEIN PO Take 30 mLs by mouth daily. Liquid protein for low  albumin   sertraline (ZOLOFT) 50 MG tablet Take 1 tablet (50 mg total) by mouth at bedtime.   sodium bicarbonate 650 MG tablet Take 650 mg by mouth 3 (three) times daily.   thiamine 100 MG tablet Take 1 tablet (100 mg total) by mouth daily.   triamcinolone 0.1%-silver sulfadiazine 1:1 cream mixture Apply topically as needed.     Allergies:   Lisinopril   Social History   Socioeconomic History   Marital status: Single    Spouse name: Not on file   Number of children: Not on file   Years of education: Not on file   Highest education level: Not on file  Occupational History   Occupation: retired    Comment: drove a Medical illustrator  Tobacco Use   Smoking status: Every Day    Packs/day: 0.25    Years: 45.00    Pack years: 11.25    Types: Cigarettes    Last attempt to quit: 11/12/2014    Years since quitting: 6.6   Smokeless tobacco: Never  Vaping Use   Vaping Use: Never used  Substance and Sexual Activity   Alcohol use: Yes    Alcohol/week: 2.0 standard drinks    Types: 1 Glasses of wine, 1 Cans of beer per week   Drug use: No    Sexual activity: Not Currently  Other Topics Concern   Not on file  Social History Narrative   09/23/20 - Lives alone, uses a wheelchair, double amputee, not married, no children - HHA comes in 3x per week to help him bathe and set up weekly meds.   Social Determinants of Health   Financial Resource Strain: Low Risk    Difficulty of Paying Living Expenses: Not very hard  Food Insecurity: No Food Insecurity   Worried About Charity fundraiser in the Last Year: Never true   Ran Out of Food in the Last Year: Never true  Transportation Needs: No Transportation Needs   Lack of Transportation (Medical): No   Lack of Transportation (Non-Medical): No  Physical Activity: Inactive   Days of Exercise per Week: 0 days   Minutes of Exercise per Session: 0 min  Stress: No Stress Concern Present   Feeling of Stress : Only a little  Social Connections: Socially Isolated   Frequency of Communication with Friends and Family: More than three times a week   Frequency of Social Gatherings with Friends and Family: More than three times a week   Attends Religious Services: Never   Marine scientist or Organizations: No   Attends Music therapist: Never   Marital Status: Never married     Family History: The patient's family history includes Diabetes in his maternal aunt and maternal uncle; Heart disease in his mother; Pneumonia in his father.  ROS:   Please see the history of present illness.    All other systems reviewed and are negative.  EKGs/Labs/Other Studies Reviewed:    The following studies were reviewed today:  December blood work  EKG:  The ekg ordered today demonstrates sinus arrhythmia  Recent Labs: 05/18/2021: ALT 14; TSH 5.550 05/27/2021: B Natriuretic Peptide 753.0; Magnesium 1.7 05/28/2021: BUN 29; Creatinine, Ser 2.07; Hemoglobin 9.2; Platelets 617; Potassium 3.4; Sodium 142  Recent Lipid Panel    Component Value Date/Time   CHOL 59 (L) 09/22/2020 0923    TRIG 60 09/22/2020 0923   HDL 25 (L) 09/22/2020 0923   CHOLHDL 2.4 09/22/2020 0923   LDLCALC 19 09/22/2020 2841  Physical Exam:    VS:  BP 138/74    Pulse 67    Ht 5\' 8"  (1.727 m)    SpO2 98%    BMI 22.29 kg/m     Wt Readings from Last 3 Encounters:  05/29/21 146 lb 9.7 oz (66.5 kg)  05/24/21 130 lb (59 kg)  05/11/21 132 lb 11.5 oz (60.2 kg)     GEN:  Well nourished, well developed in no acute distress.  In wheelchair HEENT: Normal NECK: No JVD; No carotid bruits LYMPHATICS: No lymphadenopathy CARDIAC: RRR, no murmurs, rubs, gallops RESPIRATORY:  Clear to auscultation without rales, wheezing or rhonchi  ABDOMEN: Soft, non-tender, non-distended MUSCULOSKELETAL: Bilateral lower extremity amputations SKIN: Warm and dry NEUROLOGIC:  Alert and oriented x 3.   PSYCHIATRIC:  Normal affect        ASSESSMENT:    1. Syncope and collapse   2. Ventricular tachycardia   3. Atrial fibrillation with RVR (HCC)    PLAN:    In order of problems listed above:  #Syncope #Ventricular tachycardia Has been electrically questant on amiodarone 200 mg mouth once a day.  We will continue this dosage.  He will follow-up in 4 months with an APP.  He will need to have blood work at that appointment.  Not currently a candidate for ICD given other comorbidities including history of polysubstance abuse.  #Atrial fibrillation Maintaining sinus rhythm on amiodarone.  Continue Eliquis for stroke prophylaxis.  Follow-up 4 months with APP.  CMP, TSH and free T4 at that appointment.    Medication Adjustments/Labs and Tests Ordered: Current medicines are reviewed at length with the patient today.  Concerns regarding medicines are outlined above.  No orders of the defined types were placed in this encounter.  No orders of the defined types were placed in this encounter.    Signed, Lars Mage, MD, [facility], [facility] 07/09/2021 2:14 PM    Electrophysiology [facility]  HeartCare

## 2021-07-09 NOTE — Patient Instructions (Signed)
Medication Instructions:  Your physician recommends that you continue on your current medications as directed. Please refer to the Current Medication list given to you today. *If you need a refill on your cardiac medications before your next appointment, please call your pharmacy*  Lab Work: None. If you have labs (blood work) drawn today and your tests are completely normal, you will receive your results only by: [place] (if you have MyChart) OR A paper copy in the mail If you have any lab test that is abnormal or we need to change your treatment, we will call you to review the results.  Testing/Procedures: None.  Follow-Up: At [facility], you and your health needs are our priority.  As part of our continuing mission to provide you with exceptional heart care, we have created designated Provider Care Teams.  These Care Teams include your primary Cardiologist (physician) and Advanced Practice Providers (APPs -  Physician Assistants and Nurse Practitioners) who all work together to provide you with the care you need, when you need it.  Your physician wants you to follow-up in: 4 months with  one of the following Advanced Practice Providers on your designated Care Team:    Tommye Standard, [place] Caetano "Jonni Sanger" Gasquet, [place]   You will receive a reminder letter in the mail two months in advance. If you don't receive a letter, please call our office to schedule the follow-up appointment.  We recommend signing up for the patient portal called "MyChart".  Sign up information is provided on this After Visit Summary.  MyChart is used to connect with patients for Virtual Visits (Telemedicine).  Patients are able to view lab/test results, encounter notes, upcoming appointments, etc.  Non-urgent messages can be sent to your provider as well.   To learn more about what you can do with MyChart, go to [email or web address].    Any Other Special Instructions Will Be Listed Below (If  Applicable).

## 2021-07-16 DIAGNOSIS — F1910 Other psychoactive substance abuse, uncomplicated: Secondary | ICD-10-CM | POA: Diagnosis not present

## 2021-07-16 DIAGNOSIS — I4891 Unspecified atrial fibrillation: Secondary | ICD-10-CM | POA: Diagnosis not present

## 2021-07-16 DIAGNOSIS — I5020 Unspecified systolic (congestive) heart failure: Secondary | ICD-10-CM | POA: Diagnosis not present

## 2021-07-16 DIAGNOSIS — I10 Essential (primary) hypertension: Secondary | ICD-10-CM | POA: Diagnosis not present

## 2021-07-16 DIAGNOSIS — E039 Hypothyroidism, unspecified: Secondary | ICD-10-CM | POA: Diagnosis not present

## 2021-07-16 DIAGNOSIS — Z89611 Acquired absence of right leg above knee: Secondary | ICD-10-CM | POA: Diagnosis not present

## 2021-07-16 DIAGNOSIS — E1169 Type 2 diabetes mellitus with other specified complication: Secondary | ICD-10-CM | POA: Diagnosis not present

## 2021-07-16 DIAGNOSIS — Z89612 Acquired absence of left leg above knee: Secondary | ICD-10-CM | POA: Diagnosis not present

## 2021-07-19 DIAGNOSIS — E785 Hyperlipidemia, unspecified: Secondary | ICD-10-CM | POA: Diagnosis not present

## 2021-07-19 DIAGNOSIS — E1165 Type 2 diabetes mellitus with hyperglycemia: Secondary | ICD-10-CM | POA: Diagnosis not present

## 2021-07-19 DIAGNOSIS — E1169 Type 2 diabetes mellitus with other specified complication: Secondary | ICD-10-CM | POA: Diagnosis not present

## 2021-07-19 DIAGNOSIS — I4891 Unspecified atrial fibrillation: Secondary | ICD-10-CM | POA: Diagnosis not present

## 2021-07-19 DIAGNOSIS — Z794 Long term (current) use of insulin: Secondary | ICD-10-CM | POA: Diagnosis not present

## 2021-07-19 DIAGNOSIS — R5381 Other malaise: Secondary | ICD-10-CM | POA: Diagnosis not present

## 2021-07-19 DIAGNOSIS — Z79899 Other long term (current) drug therapy: Secondary | ICD-10-CM | POA: Diagnosis not present

## 2021-07-19 DIAGNOSIS — I10 Essential (primary) hypertension: Secondary | ICD-10-CM | POA: Diagnosis not present

## 2021-07-19 DIAGNOSIS — R739 Hyperglycemia, unspecified: Secondary | ICD-10-CM | POA: Diagnosis not present

## 2021-07-21 ENCOUNTER — Telehealth (HOSPITAL_COMMUNITY): Payer: Self-pay | Admitting: Emergency Medicine

## 2021-07-21 DIAGNOSIS — I509 Heart failure, unspecified: Secondary | ICD-10-CM | POA: Diagnosis not present

## 2021-07-21 DIAGNOSIS — E119 Type 2 diabetes mellitus without complications: Secondary | ICD-10-CM | POA: Diagnosis not present

## 2021-07-21 NOTE — Telephone Encounter (Signed)
Unable to leave vm for callback Marchia Bond RN Navigator Cardiac Imaging [facility] [phone] Office  [phone] Cell

## 2021-07-22 ENCOUNTER — Other Ambulatory Visit: Payer: Self-pay

## 2021-07-22 ENCOUNTER — Ambulatory Visit (HOSPITAL_COMMUNITY)
Admission: RE | Admit: 2021-07-22 | Discharge: 2021-07-22 | Disposition: A | Discharge status: Home / Self Care | Payer: Medicare HMO | Source: Ambulatory Visit | Attending: Student | Admitting: Student

## 2021-07-22 DIAGNOSIS — E1169 Type 2 diabetes mellitus with other specified complication: Secondary | ICD-10-CM | POA: Diagnosis not present

## 2021-07-22 DIAGNOSIS — F32A Depression, unspecified: Secondary | ICD-10-CM | POA: Diagnosis not present

## 2021-07-22 DIAGNOSIS — I5020 Unspecified systolic (congestive) heart failure: Secondary | ICD-10-CM | POA: Diagnosis not present

## 2021-07-22 DIAGNOSIS — E039 Hypothyroidism, unspecified: Secondary | ICD-10-CM | POA: Diagnosis not present

## 2021-07-22 DIAGNOSIS — N184 Chronic kidney disease, stage 4 (severe): Secondary | ICD-10-CM | POA: Diagnosis not present

## 2021-07-22 DIAGNOSIS — I4891 Unspecified atrial fibrillation: Secondary | ICD-10-CM | POA: Diagnosis not present

## 2021-07-22 DIAGNOSIS — I472 Ventricular tachycardia, unspecified: Secondary | ICD-10-CM | POA: Insufficient documentation

## 2021-07-22 DIAGNOSIS — I10 Essential (primary) hypertension: Secondary | ICD-10-CM | POA: Diagnosis not present

## 2021-07-22 DIAGNOSIS — E785 Hyperlipidemia, unspecified: Secondary | ICD-10-CM | POA: Diagnosis not present

## 2021-07-22 MED ORDER — GADOBUTROL 1 MMOL/ML IV SOLN
8.0000 mL | Freq: Once | INTRAVENOUS | Status: AC | PRN
  Administered 2021-07-22: 8 mL via INTRAVENOUS

## 2021-07-23 DIAGNOSIS — E119 Type 2 diabetes mellitus without complications: Secondary | ICD-10-CM | POA: Diagnosis not present

## 2021-07-28 DIAGNOSIS — R5383 Other fatigue: Secondary | ICD-10-CM | POA: Diagnosis not present

## 2021-07-28 DIAGNOSIS — I482 Chronic atrial fibrillation, unspecified: Secondary | ICD-10-CM | POA: Diagnosis not present

## 2021-07-28 DIAGNOSIS — R2689 Other abnormalities of gait and mobility: Secondary | ICD-10-CM | POA: Diagnosis not present

## 2021-07-28 DIAGNOSIS — S31819A Unspecified open wound of right buttock, initial encounter: Secondary | ICD-10-CM | POA: Diagnosis not present

## 2021-07-28 DIAGNOSIS — I6930 Unspecified sequelae of cerebral infarction: Secondary | ICD-10-CM | POA: Diagnosis not present

## 2021-07-28 DIAGNOSIS — E538 Deficiency of other specified B group vitamins: Secondary | ICD-10-CM | POA: Diagnosis not present

## 2021-07-28 DIAGNOSIS — E11622 Type 2 diabetes mellitus with other skin ulcer: Secondary | ICD-10-CM | POA: Diagnosis not present

## 2021-07-28 DIAGNOSIS — I739 Peripheral vascular disease, unspecified: Secondary | ICD-10-CM | POA: Diagnosis not present

## 2021-07-28 DIAGNOSIS — L89310 Pressure ulcer of right buttock, unstageable: Secondary | ICD-10-CM | POA: Diagnosis not present

## 2021-07-28 DIAGNOSIS — R253 Fasciculation: Secondary | ICD-10-CM | POA: Diagnosis not present

## 2021-07-29 DIAGNOSIS — R739 Hyperglycemia, unspecified: Secondary | ICD-10-CM | POA: Diagnosis not present

## 2021-07-29 DIAGNOSIS — E1165 Type 2 diabetes mellitus with hyperglycemia: Secondary | ICD-10-CM | POA: Diagnosis not present

## 2021-07-29 DIAGNOSIS — Z794 Long term (current) use of insulin: Secondary | ICD-10-CM | POA: Diagnosis not present

## 2021-07-29 DIAGNOSIS — Z89611 Acquired absence of right leg above knee: Secondary | ICD-10-CM | POA: Diagnosis not present

## 2021-07-29 DIAGNOSIS — E785 Hyperlipidemia, unspecified: Secondary | ICD-10-CM | POA: Diagnosis not present

## 2021-07-29 DIAGNOSIS — R7309 Other abnormal glucose: Secondary | ICD-10-CM | POA: Diagnosis not present

## 2021-07-29 DIAGNOSIS — E1169 Type 2 diabetes mellitus with other specified complication: Secondary | ICD-10-CM | POA: Diagnosis not present

## 2021-07-29 DIAGNOSIS — I10 Essential (primary) hypertension: Secondary | ICD-10-CM | POA: Diagnosis not present

## 2021-07-29 DIAGNOSIS — Z89612 Acquired absence of left leg above knee: Secondary | ICD-10-CM | POA: Diagnosis not present

## 2021-07-30 ENCOUNTER — Telehealth: Payer: Self-pay | Admitting: Student

## 2021-07-30 NOTE — Telephone Encounter (Signed)
Patient's sister calling back for MRI results.

## 2021-07-30 NOTE — Telephone Encounter (Signed)
Details of phone conversation in result note.

## 2021-08-02 DIAGNOSIS — F1410 Cocaine abuse, uncomplicated: Secondary | ICD-10-CM | POA: Diagnosis not present

## 2021-08-02 DIAGNOSIS — F321 Major depressive disorder, single episode, moderate: Secondary | ICD-10-CM | POA: Diagnosis not present

## 2021-08-02 DIAGNOSIS — F17220 Nicotine dependence, chewing tobacco, uncomplicated: Secondary | ICD-10-CM | POA: Diagnosis not present

## 2021-08-04 DIAGNOSIS — S31819A Unspecified open wound of right buttock, initial encounter: Secondary | ICD-10-CM | POA: Diagnosis not present

## 2021-08-04 DIAGNOSIS — E11622 Type 2 diabetes mellitus with other skin ulcer: Secondary | ICD-10-CM | POA: Diagnosis not present

## 2021-08-04 DIAGNOSIS — I739 Peripheral vascular disease, unspecified: Secondary | ICD-10-CM | POA: Diagnosis not present

## 2021-08-04 DIAGNOSIS — L89310 Pressure ulcer of right buttock, unstageable: Secondary | ICD-10-CM | POA: Diagnosis not present

## 2021-08-10 DIAGNOSIS — E11622 Type 2 diabetes mellitus with other skin ulcer: Secondary | ICD-10-CM | POA: Diagnosis not present

## 2021-08-10 DIAGNOSIS — S31819A Unspecified open wound of right buttock, initial encounter: Secondary | ICD-10-CM | POA: Diagnosis not present

## 2021-08-10 DIAGNOSIS — L89310 Pressure ulcer of right buttock, unstageable: Secondary | ICD-10-CM | POA: Diagnosis not present

## 2021-08-10 DIAGNOSIS — I739 Peripheral vascular disease, unspecified: Secondary | ICD-10-CM | POA: Diagnosis not present

## 2021-08-11 ENCOUNTER — Telehealth: Payer: Self-pay

## 2021-08-11 NOTE — Telephone Encounter (Signed)
Referral with all documentation requested has been faxed to [facility] for this pt to have a PET Scan for Sarcoid study.  ? ?Fax confirmation received.  ?

## 2021-08-11 NOTE — Telephone Encounter (Signed)
? ?  Ulice Dash PET scan supervisor with [facility] calling, he said, he received the order for pt's PET scan, however, pt is not a good candidate to get this test due to pt is insulin dependent and it will not have an accurate result. ?

## 2021-08-29 DIAGNOSIS — I429 Cardiomyopathy, unspecified: Secondary | ICD-10-CM | POA: Insufficient documentation

## 2021-08-29 DIAGNOSIS — N1832 Chronic kidney disease, stage 3b: Secondary | ICD-10-CM | POA: Insufficient documentation

## 2021-08-29 DIAGNOSIS — R9431 Abnormal electrocardiogram [ECG] [EKG]: Secondary | ICD-10-CM | POA: Insufficient documentation

## 2021-08-29 DIAGNOSIS — I4819 Other persistent atrial fibrillation: Secondary | ICD-10-CM | POA: Insufficient documentation

## 2021-08-29 NOTE — Progress Notes (Addendum)
?  ?Cardiology Office Note ? ? ?Date:  08/30/2021  ? ?ID:  Todd [name], DOB 1955/08/14, MRN [id number] ? ?PCP:  Pcp, No  ?Cardiologist:   Minus Breeding, MD ? ? ?Chief Complaint  ?Patient presents with  ? Atrial Fibrillation  ? ? ?  ?History of Present Illness: ?Todd [name] is a 66 y.o. male who presents for follow up of cardiomyopathy (EF 40 - 45%), PAF, HTN and CKD 3b.  He has had sustained ventricular tachycardia.  He has been treated with amiodarone.  Cardiac catheterization was not completed due to chronic kidney disease.  He was to follow-up with EP for management of atrial fib.  He was treated for acute systolic CHF (echo revealing EF of 45% to 50%).  No ACE or ARB or ARNI, was given because of renal failure, therefore he was continued on beta-blocker, hydralazine, Imdur.  He was to follow-up with nephrology as an outpatient. ? ?He actually lives at [facility] in [place].  He gets around in a wheelchair but cannot push himself.  He is status post bilateral AKA. The patient denies any new symptoms such as chest discomfort, neck or arm discomfort. There has been no new shortness of breath, PND or orthopnea. There have been no reported palpitations, presyncope or syncope.  ? ?Past Medical History:  ?Diagnosis Date  ? AKI (acute kidney injury) ([place])   ? Constipated   ? Diabetes mellitus without complication ([place])   ? Diarrhea   ? Elevated LFTs   ? Goiter   ? Gout   ? Hyperlipidemia   ? Hypertension   ? Leukocytosis   ? Reactive thrombocytosis   ? Right BKA infection ([place]) 08/2016  ? Right leg pain   ? Sepsis due to undetermined organism [facility])   ? Thyroid disease   ? Wound infection after surgery 08/2016  ? ? ?Past Surgical History:  ?Procedure Laterality Date  ? ABDOMINAL AORTOGRAM N/A 08/11/2016  ? Procedure: Abdominal Aortogram;  Surgeon: Waynetta Sandy, MD;  Location: [place] [facility];  Service: Cardiovascular;  Laterality: N/A;  ? ABDOMINAL AORTOGRAM W/LOWER EXTREMITY N/A 08/15/2016  ?  Procedure: Abdominal Aortogram w/Lower Extremity;  Surgeon: Elam Dutch, MD;  Location: [place] [facility];  Service: Cardiovascular;  Laterality: N/A;  ? AMPUTATION Right 08/17/2016  ? Procedure: RIGHT BELOW KNEE AMPUTATION;  Surgeon: Elam Dutch, MD;  Location: [place];  Service: Vascular;  Laterality: Right;  ? AMPUTATION Right 09/12/2016  ? Procedure: AMPUTATION ABOVE KNEE;  Surgeon: Newt Minion, MD;  Location: [place];  Service: Orthopedics;  Laterality: Right;  ? AMPUTATION Left 08/12/2016  ? Procedure: LEFT BELOW KNEE AMPUTATION;  Surgeon: Newt Minion, MD;  Location: [place];  Service: Orthopedics;  Laterality: Left;  ? AMPUTATION Left 11/01/2017  ? Procedure: LEFT ABOVE KNEE AMPUTATION;  Surgeon: Newt Minion, MD;  Location: [place];  Service: Orthopedics;  Laterality: Left;  ? APPLICATION OF WOUND VAC Right 09/12/2016  ? Procedure: APPLICATION OF WOUND VAC ABOVE KNEE;  Surgeon: Newt Minion, MD;  Location: [place];  Service: Orthopedics;  Laterality: Right;  ? BIOPSY  01/13/2021  ? Procedure: BIOPSY;  Surgeon: Rogene Houston, MD;  [facility];  Service: Endoscopy;;  esophageal  ? COLONOSCOPY WITH PROPOFOL N/A 01/13/2021  ? Procedure: COLONOSCOPY WITH PROPOFOL;  Surgeon: Rogene Houston, MD;  [facility];  Service: Endoscopy;  Laterality: N/A;  ? ESOPHAGOGASTRODUODENOSCOPY (EGD) WITH PROPOFOL N/A 01/13/2021  ? Procedure: ESOPHAGOGASTRODUODENOSCOPY (  EGD) WITH PROPOFOL;  Surgeon: Rogene Houston, MD;  [facility];  Service: Endoscopy;  Laterality: N/A;  ? LOWER EXTREMITY ANGIOGRAPHY Bilateral 08/11/2016  ? Procedure: Lower Extremity Angiography;  Surgeon: Waynetta Sandy, MD;  Location: [place] [facility];  Service: Cardiovascular;  Laterality: Bilateral;  ? PERIPHERAL VASCULAR BALLOON ANGIOPLASTY Left 08/11/2016  ? Procedure: Peripheral Vascular Balloon Angioplasty;  Surgeon: Waynetta Sandy, MD;  Location: [place] [facility];  Service: Cardiovascular;   Laterality: Left;  SFA  ? POLYPECTOMY  01/13/2021  ? Procedure: POLYPECTOMY;  Surgeon: Rogene Houston, MD;  [facility];  Service: Endoscopy;;  ? THYROID SURGERY    ? ? ? ?Current Outpatient Medications  ?Medication Sig Dispense Refill  ? acetaminophen (TYLENOL) 325 MG tablet Take 2 tablets (650 mg total) by mouth every 6 (six) hours as needed for mild pain (or Fever >/= 101).    ? allopurinol (ZYLOPRIM) 100 MG tablet Take 1 tablet (100 mg total) by mouth daily. 30 tablet 1  ? apixaban (ELIQUIS) 5 MG TABS tablet Take 1 tablet (5 mg total) by mouth 2 (two) times daily. 60 tablet 5  ? Ascorbic Acid 500 MG CAPS Take 500 mg by mouth 2 (two) times daily.    ? atorvastatin (LIPITOR) 40 MG tablet TAKE 1 TABLET BY MOUTH ONCE DAILY AT 6 PM (Patient taking differently: Take 40 mg by mouth daily.) 30 tablet 1  ? calcium carbonate (TUMS - DOSED IN MG ELEMENTAL CALCIUM) 500 MG chewable tablet Chew 2 tablets (400 mg of elemental calcium total) by mouth 2 (two) times daily with a meal. (Patient taking differently: Chew 2 tablets by mouth daily. 2 tablets before meals) 60 tablet 4  ? cetirizine (ZYRTEC) 10 MG tablet Take 1 tablet (10 mg total) by mouth daily as needed for allergies. For allergy symptoms 90 tablet 1  ? cholecalciferol (VITAMIN D3) 25 MCG (1000 UNIT) tablet Take 1,000 Units by mouth daily.    ? folic acid (FOLVITE) 1 MG tablet Take 1 tablet (1 mg total) by mouth daily. 30 tablet 5  ? furosemide (LASIX) 40 MG tablet Take 1 tablet (40 mg total) by mouth daily. (Patient taking differently: Take 20 mg by mouth 2 (two) times daily.) 30 tablet   ? glucose blood (ACCU-CHEK AVIVA PLUS) test strip Check blood sugars four times a day 400 each 3  ? hydrALAZINE (APRESOLINE) 25 MG tablet Take 1 tablet (25 mg total) by mouth 3 (three) times daily.    ? insulin glargine (LANTUS) 100 UNIT/ML Solostar Pen Inject 20 Units into the skin at bedtime. 30 mL 3  ? insulin lispro (HUMALOG) 100 UNIT/ML KwikPen Inject 4-10 Units  into the skin 3 (three) times daily. Sliding scale 200-250 2units, 251-300 4units, 301-350 6units, 351-400 8units,401-450 10units 15 mL 3  ? isosorbide mononitrate (IMDUR) 30 MG 24 hr tablet Take 1 tablet (30 mg total) by mouth daily. (Patient taking differently: Take 30 mg by mouth daily. 2 tablets by mouth once daily)    ? levothyroxine (SYNTHROID) 100 MCG tablet Take 1 tablet (100 mcg total) by mouth daily before breakfast.    ? metoprolol succinate (TOPROL-XL) 100 MG 24 hr tablet Take 1 tablet (100 mg total) by mouth daily. Take with or immediately following a meal.    ? Multiple Vitamin (MULTIVITAMIN WITH MINERALS) TABS tablet Take 1 tablet by mouth daily.    ? pantoprazole (PROTONIX) 40 MG tablet Take 1 tablet (40 mg total) by mouth  2 (two) times daily.    ? polyethylene glycol (MIRALAX / GLYCOLAX) 17 g packet Take 17 g by mouth daily. 14 each 0  ? potassium chloride SA (KLOR-CON M) 20 MEQ tablet Take 1 tablet (20 mEq total) by mouth daily.    ? sertraline (ZOLOFT) 50 MG tablet Take 1 tablet (50 mg total) by mouth at bedtime. 30 tablet 1  ? sodium bicarbonate 650 MG tablet Take 650 mg by mouth 3 (three) times daily.    ? thiamine 100 MG tablet Take 1 tablet (100 mg total) by mouth daily. 30 tablet 3  ? LORazepam (ATIVAN) 0.5 MG tablet Take 1 tablet (0.5 mg total) by mouth every 6 (six) hours as needed for anxiety. 4 tablet 0  ? megestrol (MEGACE) 400 MG/10ML suspension Take 10 mLs (400 mg total) by mouth 2 (two) times daily. For appetite stimulation 500 mL 1  ? PROTEIN PO Take 30 mLs by mouth daily. Liquid protein for low albumin (Patient not taking: Reported on 08/30/2021)    ? triamcinolone 0.1%-silver sulfadiazine 1:1 cream mixture Apply topically as needed.    ? ?No current facility-administered medications for this visit.  ? ? ?Allergies:   Lisinopril  ? ? ?ROS:  Please see the history of present illness.   Otherwise, review of systems are positive for none.   All other systems are reviewed and negative.   ? ? ?PHYSICAL EXAM: ?VS:  BP (!) 102/58   SpO2 98%  , BMI There is no height or weight on file to calculate BMI. ?GEN:  No distress ?NECK:  No jugular venous distention at 90 degrees, waveform within normal limi

## 2021-08-30 ENCOUNTER — Other Ambulatory Visit: Payer: Self-pay

## 2021-08-30 ENCOUNTER — Ambulatory Visit (INDEPENDENT_AMBULATORY_CARE_PROVIDER_SITE_OTHER): Payer: Medicare (Managed Care) | Admitting: Cardiology

## 2021-08-30 ENCOUNTER — Encounter: Payer: Self-pay | Admitting: Cardiology

## 2021-08-30 VITALS — BP 102/58

## 2021-08-30 DIAGNOSIS — I429 Cardiomyopathy, unspecified: Secondary | ICD-10-CM | POA: Diagnosis not present

## 2021-08-30 DIAGNOSIS — N1832 Chronic kidney disease, stage 3b: Secondary | ICD-10-CM | POA: Diagnosis not present

## 2021-08-30 DIAGNOSIS — E118 Type 2 diabetes mellitus with unspecified complications: Secondary | ICD-10-CM | POA: Diagnosis not present

## 2021-08-30 DIAGNOSIS — I4819 Other persistent atrial fibrillation: Secondary | ICD-10-CM | POA: Diagnosis not present

## 2021-08-30 DIAGNOSIS — I10 Essential (primary) hypertension: Secondary | ICD-10-CM

## 2021-08-30 DIAGNOSIS — R9431 Abnormal electrocardiogram [ECG] [EKG]: Secondary | ICD-10-CM

## 2021-08-30 LAB — HEPATIC FUNCTION PANEL
ALT: 18 IU/L (ref 0–44)
AST: 41 IU/L — ABNORMAL HIGH (ref 0–40)
Albumin: 3.5 g/dL — ABNORMAL LOW (ref 3.8–4.8)
Alkaline Phosphatase: 118 IU/L (ref 44–121)
Bilirubin Total: 0.6 mg/dL (ref 0.0–1.2)
Bilirubin, Direct: 0.38 mg/dL (ref 0.00–0.40)
Total Protein: 8.1 g/dL (ref 6.0–8.5)

## 2021-08-30 LAB — TSH: TSH: 3 u[IU]/mL (ref 0.450–4.500)

## 2021-08-30 LAB — MAGNESIUM: Magnesium: 2.2 mg/dL (ref 1.6–2.3)

## 2021-08-30 NOTE — Patient Instructions (Signed)
Medication Instructions:  ?Continue same medications  ?*If you need a refill on your cardiac medications before your next appointment, please call your pharmacy* ? ? ?Lab Work: ?Tsh,hepatic panel today ? ? ?Testing/Procedures: ?None ordered ? ? ?Follow-Up: ?At [facility], you and your health needs are our priority.  As part of our continuing mission to provide you with exceptional heart care, we have created designated Provider Care Teams.  These Care Teams include your primary Cardiologist (physician) and Advanced Practice Providers (APPs -  Physician Assistants and Nurse Practitioners) who all work together to provide you with the care you need, when you need it. ? ?We recommend signing up for the patient portal called "MyChart".  Sign up information is provided on this After Visit Summary.  MyChart is used to connect with patients for Virtual Visits (Telemedicine).  Patients are able to view lab/test results, encounter notes, upcoming appointments, etc.  Non-urgent messages can be sent to your provider as well.   ?To learn more about what you can do with MyChart, go to [email or web address].   ? ?Your next appointment:  6 months ?  ? ?The format for your next appointment: Office ? ? ?Provider:  Bernerd Pho PA  [place] office ? ? ?

## 2021-08-31 ENCOUNTER — Telehealth: Payer: Self-pay | Admitting: *Deleted

## 2021-08-31 ENCOUNTER — Encounter: Payer: Self-pay | Admitting: *Deleted

## 2021-08-31 NOTE — Telephone Encounter (Signed)
Order faxed to [facility] at [phone]. They will let me know if they can not do ECG in one month. ?

## 2021-08-31 NOTE — Telephone Encounter (Signed)
-----   Message from Minus Breeding, MD sent at 08/31/2021  7:42 AM EDT ----- ? Can we call him and ask him to stop the amiodarone.  I would like to get an EKG in one month.  He is in a facility.   ? ? ?

## 2021-09-01 ENCOUNTER — Ambulatory Visit: Payer: Medicare HMO | Admitting: Nurse Practitioner

## 2021-09-01 NOTE — Patient Instructions (Incomplete)

## 2021-09-02 ENCOUNTER — Telehealth: Payer: Self-pay | Admitting: Cardiology

## 2021-09-02 NOTE — Telephone Encounter (Signed)
[place] from [place] wanted to inform Dr. [name] that the patient has not been taking amlodipine, therefore it can't be discontinued. and the facility does not perform EKGs. [place] said the facility cannot take orders from another nurse. ?

## 2021-09-02 NOTE — Telephone Encounter (Signed)
New Message: ? ? ? ? ?She need to verify  orders that was sent on 08-31-21 by Debra. They do not do any EKG's at this facility. ?

## 2021-09-06 ENCOUNTER — Encounter: Payer: Self-pay | Admitting: *Deleted

## 2021-09-06 NOTE — Telephone Encounter (Signed)
Fax sent to [facility] asking for an updated medication list. ?

## 2021-09-06 NOTE — Telephone Encounter (Signed)
This encounter was created in error - please disregard.

## 2021-09-06 NOTE — Telephone Encounter (Signed)
-----   Message from Minus Breeding, MD sent at 08/31/2021  7:42 AM EDT ----- ? Can we call him and ask him to stop the amiodarone.  I would like to get an EKG in one month.  He is in a facility.   ? ? ?

## 2021-09-07 ENCOUNTER — Telehealth: Payer: Self-pay | Admitting: Cardiology

## 2021-09-07 NOTE — Telephone Encounter (Signed)
I called pt's Sister back and gave her this information that I found documented in his chart. She would like to know what the next step is since he can't have this scan done?  ? ? ?Ulice Dash PET scan supervisor with [facility] calling, he said, he received the order for pt's PET scan, however, pt is not a good candidate to get this test due to pt is insulin dependent and it will not have an accurate result. ?

## 2021-09-07 NOTE — Telephone Encounter (Signed)
Patient's sister calling to go over his last OV with Dr. [name] on 08/30/21. Please advise.  ?

## 2021-09-07 NOTE — Telephone Encounter (Signed)
Patient's sister calling to follow up with April G, CMA in regards to her brother having a PET scan at [facility]. Please advise.  ?

## 2021-09-07 NOTE — Telephone Encounter (Signed)
Returned call to sister (ok per DPR)-advised of changes from OV. ? ?She states facility is unable to do EKG and we will need to arrange this.   She also is wondering what is recommended since they are unable to do the PET scan.   Per chart review-awaiting Dr. [name] recommendations.    ? ?Also advised we will contact facility to arrange EKG.   ?

## 2021-09-08 ENCOUNTER — Encounter: Payer: Self-pay | Admitting: Nurse Practitioner

## 2021-09-08 ENCOUNTER — Ambulatory Visit (INDEPENDENT_AMBULATORY_CARE_PROVIDER_SITE_OTHER): Payer: Medicare (Managed Care) | Admitting: Nurse Practitioner

## 2021-09-08 VITALS — BP 103/56 | HR 63

## 2021-09-08 DIAGNOSIS — E039 Hypothyroidism, unspecified: Secondary | ICD-10-CM

## 2021-09-08 DIAGNOSIS — I10 Essential (primary) hypertension: Secondary | ICD-10-CM | POA: Diagnosis not present

## 2021-09-08 DIAGNOSIS — E782 Mixed hyperlipidemia: Secondary | ICD-10-CM

## 2021-09-08 DIAGNOSIS — Z794 Long term (current) use of insulin: Secondary | ICD-10-CM

## 2021-09-08 DIAGNOSIS — E1121 Type 2 diabetes mellitus with diabetic nephropathy: Secondary | ICD-10-CM

## 2021-09-08 DIAGNOSIS — E559 Vitamin D deficiency, unspecified: Secondary | ICD-10-CM

## 2021-09-08 DIAGNOSIS — F17200 Nicotine dependence, unspecified, uncomplicated: Secondary | ICD-10-CM

## 2021-09-08 LAB — POCT GLYCOSYLATED HEMOGLOBIN (HGB A1C): HbA1c, POC (controlled diabetic range): 8.7 % — AB (ref 0.0–7.0)

## 2021-09-08 MED ORDER — LEVOTHYROXINE SODIUM 88 MCG PO TABS: 88.0000 ug | ORAL_TABLET | Freq: Every day | ORAL | 3 refills | Status: DC

## 2021-09-08 NOTE — Progress Notes (Signed)
? ?                                      ?     09/08/2021, 2:25 PM ? ?      ?Endocrinology follow-up note ?    ? ? ?Subjective:  ? ? Patient ID: Todd [name], male    DOB: [birth year].  ?Todd [name] is here to follow-up for management of currently uncontrolled symptomatic diabetes requested by  Pcp, No. ? ? ?Past Medical History:  ?Diagnosis Date  ? AKI (acute kidney injury) ([place])   ? Constipated   ? Diabetes mellitus without complication ([place])   ? Diarrhea   ? Elevated LFTs   ? Goiter   ? Gout   ? Hyperlipidemia   ? Hypertension   ? Leukocytosis   ? Reactive thrombocytosis   ? Right BKA infection ([place]) 08/2016  ? Right leg pain   ? Sepsis due to undetermined organism [facility])   ? Thyroid disease   ? Wound infection after surgery 08/2016  ? ? ?Past Surgical History:  ?Procedure Laterality Date  ? ABDOMINAL AORTOGRAM N/A 08/11/2016  ? Procedure: Abdominal Aortogram;  Surgeon: Waynetta Sandy, MD;  Location: [place] [facility];  Service: Cardiovascular;  Laterality: N/A;  ? ABDOMINAL AORTOGRAM W/LOWER EXTREMITY N/A 08/15/2016  ? Procedure: Abdominal Aortogram w/Lower Extremity;  Surgeon: Elam Dutch, MD;  Location: [place] [facility];  Service: Cardiovascular;  Laterality: N/A;  ? AMPUTATION Right 08/17/2016  ? Procedure: RIGHT BELOW KNEE AMPUTATION;  Surgeon: Elam Dutch, MD;  Location: [place];  Service: Vascular;  Laterality: Right;  ? AMPUTATION Right 09/12/2016  ? Procedure: AMPUTATION ABOVE KNEE;  Surgeon: Newt Minion, MD;  Location: [place];  Service: Orthopedics;  Laterality: Right;  ? AMPUTATION Left 08/12/2016  ? Procedure: LEFT BELOW KNEE AMPUTATION;  Surgeon: Newt Minion, MD;  Location: [place];  Service: Orthopedics;  Laterality: Left;  ? AMPUTATION Left 11/01/2017  ? Procedure: LEFT ABOVE KNEE AMPUTATION;  Surgeon: Newt Minion, MD;  Location: [place];  Service: Orthopedics;  Laterality: Left;  ? APPLICATION OF WOUND VAC Right 09/12/2016  ? Procedure: APPLICATION OF WOUND VAC  ABOVE KNEE;  Surgeon: Newt Minion, MD;  Location: [place];  Service: Orthopedics;  Laterality: Right;  ? BIOPSY  01/13/2021  ? Procedure: BIOPSY;  Surgeon: Rogene Houston, MD;  [facility];  Service: Endoscopy;;  esophageal  ? COLONOSCOPY WITH PROPOFOL N/A 01/13/2021  ? Procedure: COLONOSCOPY WITH PROPOFOL;  Surgeon: Rogene Houston, MD;  [facility];  Service: Endoscopy;  Laterality: N/A;  ? ESOPHAGOGASTRODUODENOSCOPY (EGD) WITH PROPOFOL N/A 01/13/2021  ? Procedure: ESOPHAGOGASTRODUODENOSCOPY (EGD) WITH PROPOFOL;  Surgeon: Rogene Houston, MD;  [facility];  Service: Endoscopy;  Laterality: N/A;  ? LOWER EXTREMITY ANGIOGRAPHY Bilateral 08/11/2016  ? Procedure: Lower Extremity Angiography;  Surgeon: Waynetta Sandy, MD;  Location: [place] [facility];  Service: Cardiovascular;  Laterality: Bilateral;  ? PERIPHERAL VASCULAR BALLOON ANGIOPLASTY Left 08/11/2016  ? Procedure: Peripheral Vascular Balloon Angioplasty;  Surgeon: Waynetta Sandy, MD;  Location: [place] [facility];  Service: Cardiovascular;  Laterality: Left;  SFA  ? POLYPECTOMY  01/13/2021  ? Procedure: POLYPECTOMY;  Surgeon: Rogene Houston, MD;  [facility];  Service: Endoscopy;;  ? THYROID SURGERY    ? ? ?Social History  ? ?Socioeconomic History  ?  Marital status: Single  ?  Spouse name: Not on file  ? Number of children: Not on file  ? Years of education: Not on file  ? Highest education level: Not on file  ?Occupational History  ? Occupation: retired  ?  Comment: drove a fork-lift  ?Tobacco Use  ? Smoking status: Every Day  ?  Packs/day: 0.25  ?  Years: 45.00  ?  Pack years: 11.25  ?  Types: Cigarettes  ?  Last attempt to quit: 11/12/2014  ?  Years since quitting: 6.8  ? Smokeless tobacco: Never  ?Vaping Use  ? Vaping Use: Never used  ?Substance and Sexual Activity  ? Alcohol use: Yes  ?  Alcohol/week: 2.0 standard drinks  ?  Types: 1 Glasses of wine, 1 Cans of beer per week  ? Drug use: No  ?  Sexual activity: Not Currently  ?Other Topics Concern  ? Not on file  ?Social History Narrative  ? 09/23/20 - Lives alone, uses a wheelchair, double amputee, not married, no children - HHA comes in 3x per week to help him bathe and set up weekly meds.  ? ?Social Determinants of Health  ? ?Financial Resource Strain: Low Risk   ? Difficulty of Paying Living Expenses: Not very hard  ?Food Insecurity: No Food Insecurity  ? Worried About Charity fundraiser in the Last Year: Never true  ? Ran Out of Food in the Last Year: Never true  ?Transportation Needs: No Transportation Needs  ? Lack of Transportation (Medical): No  ? Lack of Transportation (Non-Medical): No  ?Physical Activity: Inactive  ? Days of Exercise per Week: 0 days  ? Minutes of Exercise per Session: 0 min  ?Stress: No Stress Concern Present  ? Feeling of Stress : Only a little  ?Social Connections: Socially Isolated  ? Frequency of Communication with Friends and Family: More than three times a week  ? Frequency of Social Gatherings with Friends and Family: More than three times a week  ? Attends Religious Services: Never  ? Active Member of Clubs or Organizations: No  ? Attends Archivist Meetings: Never  ? Marital Status: Never married  ? ? ?Family History  ?Problem Relation Age of Onset  ? Heart disease Mother   ? Pneumonia Father   ? Diabetes Maternal Aunt   ? Diabetes Maternal Uncle   ? ? ?Outpatient Encounter Medications as of 09/08/2021  ?Medication Sig  ? acetaminophen (TYLENOL) 325 MG tablet Take 2 tablets (650 mg total) by mouth every 6 (six) hours as needed for mild pain (or Fever >/= 101).  ? allopurinol (ZYLOPRIM) 100 MG tablet Take 1 tablet (100 mg total) by mouth daily.  ? apixaban (ELIQUIS) 5 MG TABS tablet Take 1 tablet (5 mg total) by mouth 2 (two) times daily.  ? Ascorbic Acid 500 MG CAPS Take 500 mg by mouth 2 (two) times daily.  ? atorvastatin (LIPITOR) 40 MG tablet TAKE 1 TABLET BY MOUTH ONCE DAILY AT 6 PM (Patient taking  differently: Take 40 mg by mouth daily.)  ? calcium carbonate (TUMS - DOSED IN MG ELEMENTAL CALCIUM) 500 MG chewable tablet Chew 2 tablets (400 mg of elemental calcium total) by mouth 2 (two) times daily with a meal. (Patient taking differently: Chew 2 tablets by mouth daily. 2 tablets before meals)  ? cetirizine (ZYRTEC) 10 MG tablet Take 1 tablet (10 mg total) by mouth daily as needed for allergies. For allergy symptoms  ? cholecalciferol (VITAMIN D3)  25 MCG (1000 UNIT) tablet Take 1,000 Units by mouth daily.  ? folic acid (FOLVITE) 1 MG tablet Take 1 tablet (1 mg total) by mouth daily.  ? furosemide (LASIX) 20 MG tablet Take 20 mg by mouth 2 (two) times daily.  ? glucose blood (ACCU-CHEK AVIVA PLUS) test strip Check blood sugars four times a day  ? hydrALAZINE (APRESOLINE) 25 MG tablet Take 1 tablet (25 mg total) by mouth 3 (three) times daily.  ? insulin glargine (LANTUS) 100 UNIT/ML Solostar Pen Inject 20 Units into the skin at bedtime.  ? insulin lispro (HUMALOG) 100 UNIT/ML KwikPen Inject 4-10 Units into the skin 3 (three) times daily. Sliding scale 200-250 2units, 251-300 4units, 301-350 6units, 351-400 8units,401-450 10units  ? isosorbide mononitrate (IMDUR) 30 MG 24 hr tablet Take 1 tablet (30 mg total) by mouth daily. (Patient taking differently: Take 30 mg by mouth daily. 2 tablets by mouth once daily)  ? levothyroxine (SYNTHROID) 88 MCG tablet Take 1 tablet (88 mcg total) by mouth daily before breakfast.  ? LORazepam (ATIVAN) 0.5 MG tablet Take 1 tablet (0.5 mg total) by mouth every 6 (six) hours as needed for anxiety.  ? megestrol (MEGACE) 400 MG/10ML suspension Take 10 mLs (400 mg total) by mouth 2 (two) times daily. For appetite stimulation  ? metoprolol succinate (TOPROL-XL) 100 MG 24 hr tablet Take 1 tablet (100 mg total) by mouth daily. Take with or immediately following a meal.  ? Multiple Vitamin (MULTIVITAMIN WITH MINERALS) TABS tablet Take 1 tablet by mouth daily.  ? pantoprazole (PROTONIX) 40  MG tablet Take 1 tablet (40 mg total) by mouth 2 (two) times daily.  ? polyethylene glycol (MIRALAX / GLYCOLAX) 17 g packet Take 17 g by mouth daily.  ? potassium chloride SA (KLOR-CON M) 20 MEQ tablet Take 1 t

## 2021-09-08 NOTE — Telephone Encounter (Signed)
Medication list updated. ?Follow up scheduled for repeat ECG. ?

## 2021-09-08 NOTE — Patient Instructions (Signed)

## 2021-09-08 NOTE — Telephone Encounter (Signed)
Spoke with pt sister, aware of the follow up appointment for ECG. ?

## 2021-09-14 ENCOUNTER — Telehealth: Payer: Self-pay | Admitting: Cardiology

## 2021-09-14 NOTE — Telephone Encounter (Signed)
?  Discuss creatin level ?

## 2021-09-14 NOTE — Telephone Encounter (Signed)
Received a call to triage from [place] at [facility] [phone]. Patient's creatinine is 3.14 from 5.74. The NP at the facility is wanting to know about any fluid restrictions and changing from lasix to torsemide. Please advise. ?

## 2021-09-15 NOTE — Telephone Encounter (Signed)
Left a message for [place] (in a meeting) to call and ask for me regarding pt. ?

## 2021-09-15 NOTE — Telephone Encounter (Signed)
Calling back for update. Please advise  

## 2021-09-15 NOTE — Telephone Encounter (Addendum)
Spoke with Malena Catholic, NP. She stated patient refuses hospital and nephrology, but she will schedule a nephrology appointment and go with the patient. She stated renal US was normal. She appreciated our help. She will fax renal US and any nephrology notes to [place]. ?

## 2021-09-15 NOTE — Telephone Encounter (Addendum)
Spoke with Malena Catholic, NP [phone] direct line) at [facility]. She will fax the last 3 BMPs. She stated the patient's GFR was less than 15, held lasix, gave fluids, then GFR 18. She ordered renal ultrasound and nephrology consult. VSS, no edema or SOB. Dr. Moshe Cipro is PCP, but Pamelia Hoit ins managing patient. Patient refuses to go to the ED. ?

## 2021-09-16 ENCOUNTER — Telehealth: Payer: Self-pay | Admitting: *Deleted

## 2021-09-16 NOTE — Telephone Encounter (Signed)
Spoke with brittney, nurse at [facility], she was calling to confirm orders to stop lasix and potassium they received yesterday. The patient also has a fluid restriction of 1200 cc daily and they want to see if that can be lifted and the patient get more fluids because of his kidney function. Aware I have received the lab work and it has been scanned into Epic. Aware will need to forward to dr hochrein to confirm orders from yesterday and get new fluid restrictions.  ?

## 2021-09-16 NOTE — Telephone Encounter (Signed)
Left detailed message for brittney of dr hochrein's recommendations. ?

## 2021-10-01 ENCOUNTER — Telehealth: Payer: Self-pay

## 2021-10-01 ENCOUNTER — Ambulatory Visit: Payer: Medicare (Managed Care)

## 2021-10-01 NOTE — Telephone Encounter (Signed)
Called patient facility, [facility], and spoke with Anderson Malta to let her know we need to cancel 2pm appt for EKG. They will have Nyomi call me back to reschedule as she handles patient appointments and transportation. Anderson Malta made aware of cancelled appointment today. Offer a 2p or 3pm appointment on Monday , 4/24.  ? ?Called patient sister [name] who manages his affairs (OKAY per DPR) and made her aware as well.  ? ?Called back from [facility] and pt rescheduled for Monday 4/24 @ 2p. ? ?

## 2021-10-01 NOTE — Telephone Encounter (Signed)
Error

## 2021-10-04 ENCOUNTER — Ambulatory Visit: Payer: Medicare (Managed Care)

## 2021-10-06 ENCOUNTER — Other Ambulatory Visit (INDEPENDENT_AMBULATORY_CARE_PROVIDER_SITE_OTHER): Payer: Medicare (Managed Care)

## 2021-10-06 VITALS — BP 140/68 | HR 60 | Wt 96.0 lb

## 2021-10-06 DIAGNOSIS — I482 Chronic atrial fibrillation, unspecified: Secondary | ICD-10-CM | POA: Diagnosis not present

## 2021-10-06 NOTE — Patient Instructions (Signed)
? ?  Nurse Visit  ? ?Date of Encounter: 10/06/2021 ?ID: Todd [name], DOB Nov 06, 1955, MRN [id number] ? ?PCP:  Pcp, No ?  ?[facility] Providers ?Cardiologist:  Minus Breeding, MD ?Electrophysiologist:  [name], MD    ? ? ?Visit Details  ? ?VS:  BP 140/68 (BP Location: Left Arm, Patient Position: Sitting, Cuff Size: Normal)   Pulse 60   Wt 96 lb (43.5 kg)   SpO2 97%   BMI 14.60 kg/m?  , BMI Body mass index is 14.6 kg/m?. ? ?Wt Readings from Last 3 Encounters:  ?10/06/21 96 lb (43.5 kg)  ?05/29/21 146 lb 9.7 oz (66.5 kg)  ?05/24/21 130 lb (59 kg)  ?  ? ?Reason for visit: EKG ?Performed today: Vitals, EKG, Provider consulted:Dr. Gwenlyn Found, and Education ?Changes (medications, testing, etc.) : continue sam medication ?Length of Visit: 60 minutes ? ? ? ?Medications Adjustments/Labs and Tests Ordered: ? ?No changes at this time. ? ? ?Signed, ?Kathreen Devoid, RN  ?10/06/2021 4:03 PM ? ?

## 2021-10-11 ENCOUNTER — Telehealth: Payer: Self-pay | Admitting: Orthopedic Surgery

## 2021-10-11 NOTE — Telephone Encounter (Signed)
Pt sister [name] called and is wanting autumn to call about some concerns with her brother.  ? ?CB [phone]  ?

## 2021-10-11 NOTE — Telephone Encounter (Signed)
Actually CB number is [phone] ?

## 2021-10-11 NOTE — Telephone Encounter (Signed)
I called and sw pt's sister and she was wanting Dr. Sharol Given to give orders for PT for the pt and prosthetic eval or rx. I advised the pt has not been in the office since 11/2017 and would need face to face eval for insurance to honor any rx and that its been 3 years since the pt has been in the office needs to have eval and see if appropriate. Pt's sister will be in town on 10/18/21 appt made for 9:30. She will call the SNF and see if she can arrange transportation at that time. Will call and r/s if needed.  ?

## 2021-10-18 ENCOUNTER — Ambulatory Visit: Payer: Medicare (Managed Care) | Admitting: Orthopedic Surgery

## 2021-11-08 NOTE — Progress Notes (Deleted)
Cardiology Office Note Date:  11/08/2021  Patient ID:  Todd [name], DOB [birth year], MRN [id number] PCP:  Pcp, No  Cardiologist:  Dr. [name] Electrophysiologist: ***  ***refresh   Chief Complaint: *** 4 mo  History of Present Illness: Todd [name] is a 66 y.o. male with history of HTN, HLD, PVD (R BKA and L AKA), DM, CKD (IIIb), AFib, poly substance abuse, VT, mild CM (not cathed 2/2 renal disease).  He comes in today to be seen for Dr. [name], last seen by him Jan 2023, discussed hx of syncope and VT, not felt an ICD candidate 2/2 polysubstance abuse and co morbidities.  Doing well on amiodarone. No changes were made, planned for 4 mo visit/labs  More recently saw Dr. [name]  08/30/21, doing ok, discussed living at [facility], mobilizes via wheelchair, but unable toi push himself.  No cardiac symptoms. Discussed prolonged QT with EP, planned for electrolytes and avoiding QT  prolonging drugs and amiodarone was stopped. Renal function and BP minimizes GDMT  10/06/21  EKG with improved QTc  *** living? *** volume *** eliquis, bleeding, dose *** off amio  AAD Hx Amiodarone stopped March 2023 2/2 QT prolongation   Past Medical History:  Diagnosis Date   AKI (acute kidney injury) ([place])    Constipated    Diabetes mellitus without complication ([place])    Diarrhea    Elevated LFTs    Goiter    Gout    Hyperlipidemia    Hypertension    Leukocytosis    Reactive thrombocytosis    Right BKA infection ([place]) 08/2016   Right leg pain    Sepsis due to undetermined organism [facility])    Thyroid disease    Wound infection after surgery 08/2016    Past Surgical History:  Procedure Laterality Date   ABDOMINAL AORTOGRAM N/A 08/11/2016   Procedure: Abdominal Aortogram;  Surgeon: Waynetta Sandy, MD;  Location: [place] [place] [facility];  Service: Cardiovascular;  Laterality: N/A;   ABDOMINAL AORTOGRAM W/LOWER EXTREMITY N/A 08/15/2016   Procedure: Abdominal Aortogram  w/Lower Extremity;  Surgeon: Elam Dutch, MD;  Location: [place] [facility];  Service: Cardiovascular;  Laterality: N/A;   AMPUTATION Right 08/17/2016   Procedure: RIGHT BELOW KNEE AMPUTATION;  Surgeon: Elam Dutch, MD;  Location: [place];  Service: Vascular;  Laterality: Right;   AMPUTATION Right 09/12/2016   Procedure: AMPUTATION ABOVE KNEE;  Surgeon: Newt Minion, MD;  Location: [place];  Service: Orthopedics;  Laterality: Right;   AMPUTATION Left 08/12/2016   Procedure: LEFT BELOW KNEE AMPUTATION;  Surgeon: Newt Minion, MD;  Location: [place];  Service: Orthopedics;  Laterality: Left;   AMPUTATION Left 11/01/2017   Procedure: LEFT ABOVE KNEE AMPUTATION;  Surgeon: Newt Minion, MD;  Location: [place];  Service: Orthopedics;  Laterality: Left;   APPLICATION OF WOUND VAC Right 09/12/2016   Procedure: APPLICATION OF WOUND VAC ABOVE KNEE;  Surgeon: Newt Minion, MD;  Location: [place];  Service: Orthopedics;  Laterality: Right;   BIOPSY  01/13/2021   Procedure: BIOPSY;  Surgeon: Rogene Houston, MD;  [facility];  Service: Endoscopy;;  esophageal   COLONOSCOPY WITH PROPOFOL N/A 01/13/2021   Procedure: COLONOSCOPY WITH PROPOFOL;  Surgeon: Rogene Houston, MD;  [facility];  Service: Endoscopy;  Laterality: N/A;   ESOPHAGOGASTRODUODENOSCOPY (EGD) WITH PROPOFOL N/A 01/13/2021   Procedure: ESOPHAGOGASTRODUODENOSCOPY (EGD) WITH PROPOFOL;  Surgeon: Rogene Houston, MD;  [facility];  Service: Endoscopy;  Laterality: N/A;   LOWER EXTREMITY ANGIOGRAPHY Bilateral 08/11/2016   Procedure: Lower Extremity Angiography;  Surgeon: Waynetta Sandy, MD;  Location: [place] [facility];  Service: Cardiovascular;  Laterality: Bilateral;   PERIPHERAL VASCULAR BALLOON ANGIOPLASTY Left 08/11/2016   Procedure: Peripheral Vascular Balloon Angioplasty;  Surgeon: Waynetta Sandy, MD;  Location: [place] [facility];  Service: Cardiovascular;  Laterality: Left;  SFA   POLYPECTOMY   01/13/2021   Procedure: POLYPECTOMY;  Surgeon: Rogene Houston, MD;  [facility];  Service: Endoscopy;;   THYROID SURGERY      Current Outpatient Medications  Medication Sig Dispense Refill   acetaminophen (TYLENOL) 325 MG tablet Take 2 tablets (650 mg total) by mouth every 6 (six) hours as needed for mild pain (or Fever >/= 101).     allopurinol (ZYLOPRIM) 100 MG tablet Take 1 tablet (100 mg total) by mouth daily. 30 tablet 1   apixaban (ELIQUIS) 5 MG TABS tablet Take 1 tablet (5 mg total) by mouth 2 (two) times daily. 60 tablet 5   Ascorbic Acid 500 MG CAPS Take 500 mg by mouth 2 (two) times daily.     atorvastatin (LIPITOR) 40 MG tablet TAKE 1 TABLET BY MOUTH ONCE DAILY AT 6 PM (Patient taking differently: Take 40 mg by mouth daily.) 30 tablet 1   calcium carbonate (TUMS - DOSED IN MG ELEMENTAL CALCIUM) 500 MG chewable tablet Chew 2 tablets (400 mg of elemental calcium total) by mouth 2 (two) times daily with a meal. (Patient taking differently: Chew 2 tablets by mouth daily. 2 tablets before meals) 60 tablet 4   cetirizine (ZYRTEC) 10 MG tablet Take 1 tablet (10 mg total) by mouth daily as needed for allergies. For allergy symptoms 90 tablet 1   cholecalciferol (VITAMIN D3) 25 MCG (1000 UNIT) tablet Take 1,000 Units by mouth daily.     folic acid (FOLVITE) 1 MG tablet Take 1 tablet (1 mg total) by mouth daily. 30 tablet 5   furosemide (LASIX) 20 MG tablet Take 20 mg by mouth 2 (two) times daily.     glucose blood (ACCU-CHEK AVIVA PLUS) test strip Check blood sugars four times a day 400 each 3   hydrALAZINE (APRESOLINE) 25 MG tablet Take 1 tablet (25 mg total) by mouth 3 (three) times daily.     insulin glargine (LANTUS) 100 UNIT/ML Solostar Pen Inject 20 Units into the skin at bedtime. 30 mL 3   insulin lispro (HUMALOG) 100 UNIT/ML KwikPen Inject 4-10 Units into the skin 3 (three) times daily. Sliding scale 200-250 2units, 251-300 4units, 301-350 6units, 351-400 8units,401-450  10units 15 mL 3   isosorbide mononitrate (IMDUR) 30 MG 24 hr tablet Take 1 tablet (30 mg total) by mouth daily. (Patient taking differently: Take 30 mg by mouth daily. 2 tablets by mouth once daily)     levothyroxine (SYNTHROID) 88 MCG tablet Take 1 tablet (88 mcg total) by mouth daily before breakfast. 90 tablet 3   LORazepam (ATIVAN) 0.5 MG tablet Take 1 tablet (0.5 mg total) by mouth every 6 (six) hours as needed for anxiety. 4 tablet 0   megestrol (MEGACE) 400 MG/10ML suspension Take 10 mLs (400 mg total) by mouth 2 (two) times daily. For appetite stimulation 500 mL 1   metoprolol succinate (TOPROL-XL) 100 MG 24 hr tablet Take 1 tablet (100 mg total) by mouth daily. Take with or immediately following a meal.     Multiple Vitamin (MULTIVITAMIN WITH MINERALS) TABS tablet Take 1 tablet  by mouth daily.     pantoprazole (PROTONIX) 40 MG tablet Take 1 tablet (40 mg total) by mouth 2 (two) times daily.     polyethylene glycol (MIRALAX / GLYCOLAX) 17 g packet Take 17 g by mouth daily. 14 each 0   potassium chloride SA (KLOR-CON M) 20 MEQ tablet Take 1 tablet (20 mEq total) by mouth daily.     PROTEIN PO Take 30 mLs by mouth daily. Liquid protein for low albumin     sertraline (ZOLOFT) 50 MG tablet Take 1 tablet (50 mg total) by mouth at bedtime. 30 tablet 1   sodium bicarbonate 650 MG tablet Take 650 mg by mouth 3 (three) times daily.     thiamine 100 MG tablet Take 1 tablet (100 mg total) by mouth daily. 30 tablet 3   triamcinolone 0.1%-silver sulfadiazine 1:1 cream mixture Apply topically as needed.     No current facility-administered medications for this visit.    Allergies:   Lisinopril   Social History:  The patient  reports that he has been smoking cigarettes. He has a 11.25 pack-year smoking history. He has never used smokeless tobacco. He reports current alcohol use of about 2.0 standard drinks per week. He reports that he does not use drugs.   Family History:  The patient's family  history includes Diabetes in his maternal aunt and maternal uncle; Heart disease in his mother; Pneumonia in his father.  ROS:  Please see the history of present illness.    All other systems are reviewed and otherwise negative.   PHYSICAL EXAM:  VS:  There were no vitals taken for this visit. BMI: There is no height or weight on file to calculate BMI. Well nourished, well developed, in no acute distress HEENT: normocephalic, atraumatic Neck: no JVD, carotid bruits or masses Cardiac:  *** RRR; no significant murmurs, no rubs, or gallops Lungs:  *** CTA b/l, no wheezing, rhonchi or rales Abd: soft, nontender MS: no deformity or *** atrophy Ext: *** no edema Skin: warm and dry, no rash Neuro:  No gross deficits appreciated Psych: euthymic mood, full affect   EKG:  not done today  04/20/2021: TTE (limited)  1. Left ventricular ejection fraction, by estimation, is 45 to 50%. The  left ventricle has mildly decreased function. There is moderate left  ventricular hypertrophy.   2. Right ventricular systolic function is normal. The right ventricular  size is normal.   3. Left atrial size was severely dilated.   4. Right atrial size was moderately dilated.   5. The inferior vena cava is normal in size with greater than 50%  respiratory variability, suggesting right atrial pressure of 3 mmHg.   6. Limited echo evaluate LV function   12/27/2020: TTE  1. Left ventricular ejection fraction, by estimation, is 55 to 60%. The  left ventricle has normal function. The left ventricle has no regional  wall motion abnormalities. There is mild concentric left ventricular  hypertrophy. Left ventricular diastolic  parameters are consistent with Grade I diastolic dysfunction (impaired  relaxation).   2. Right ventricular systolic function is normal. The right ventricular  size is normal. Tricuspid regurgitation signal is inadequate for assessing  PA pressure.   3. The mitral valve is grossly  normal. No evidence of mitral valve  regurgitation. No evidence of mitral stenosis.   4. The aortic valve is tricuspid. Aortic valve regurgitation is not  visualized. No aortic stenosis is present.   5. The inferior vena cava is normal  in size with greater than 50%  respiratory variability, suggesting right atrial pressure of 3 mmHg.   Comparison(s): No significant change from prior study.   Recent Labs: 05/27/2021: B Natriuretic Peptide 753.0 05/28/2021: BUN 29; Creatinine, Ser 2.07; Hemoglobin 9.2; Platelets 617; Potassium 3.4; Sodium 142 08/30/2021: ALT 18; Magnesium 2.2; TSH 3.000  No results found for requested labs within last 8760 hours.   CrCl cannot be calculated (Patient's most recent lab result is older than the maximum 21 days allowed.).   Wt Readings from Last 3 Encounters:  10/06/21 96 lb (43.5 kg)  05/29/21 146 lb 9.7 oz (66.5 kg)  05/24/21 130 lb (59 kg)     Other studies reviewed: Additional studies/records reviewed today include: summarized above  ASSESSMENT AND PLAN:  VT Syncope Off amiodarone 2/2 QT prolongation Unable to get c.MRI 2/2 renal function Unable to get PET 2/2 IDDM ***  Persistent AFib CHA2DS2Vasc is 3, on *** Elieuis, appropriately dosed *** burden by symptoms  CM Chronic CHF ***  Disposition: F/u with ***  Current medicines are reviewed at length with the patient today.  The patient did not have any concerns regarding medicines.  Venetia Night, PA-C 11/08/2021 4:17 PM     [place] [address] [place] [address] [phone] (office)  [phone] (fax)

## 2021-11-09 ENCOUNTER — Ambulatory Visit: Payer: Medicaid Other | Admitting: Physician Assistant

## 2021-12-17 ENCOUNTER — Encounter (HOSPITAL_COMMUNITY): Payer: Medicare (Managed Care) | Admitting: Hematology

## 2022-01-07 LAB — TSH: TSH: 0.06 — AB (ref 0.41–5.90)

## 2022-01-11 ENCOUNTER — Encounter: Payer: Self-pay | Admitting: Nurse Practitioner

## 2022-01-11 ENCOUNTER — Ambulatory Visit (INDEPENDENT_AMBULATORY_CARE_PROVIDER_SITE_OTHER): Payer: Medicare (Managed Care) | Admitting: Nurse Practitioner

## 2022-01-11 VITALS — BP 143/67 | HR 60

## 2022-01-11 DIAGNOSIS — I10 Essential (primary) hypertension: Secondary | ICD-10-CM

## 2022-01-11 DIAGNOSIS — E039 Hypothyroidism, unspecified: Secondary | ICD-10-CM

## 2022-01-11 DIAGNOSIS — E782 Mixed hyperlipidemia: Secondary | ICD-10-CM

## 2022-01-11 DIAGNOSIS — Z794 Long term (current) use of insulin: Secondary | ICD-10-CM | POA: Diagnosis not present

## 2022-01-11 DIAGNOSIS — F17200 Nicotine dependence, unspecified, uncomplicated: Secondary | ICD-10-CM

## 2022-01-11 DIAGNOSIS — E1121 Type 2 diabetes mellitus with diabetic nephropathy: Secondary | ICD-10-CM

## 2022-01-11 DIAGNOSIS — E559 Vitamin D deficiency, unspecified: Secondary | ICD-10-CM

## 2022-01-11 LAB — POCT GLYCOSYLATED HEMOGLOBIN (HGB A1C): Hemoglobin A1C: 5.6 % (ref 4.0–5.6)

## 2022-01-11 MED ORDER — LEVOTHYROXINE SODIUM 75 MCG PO TABS: 75.0000 ug | ORAL_TABLET | Freq: Every day | ORAL | 1 refills | Status: DC

## 2022-01-11 MED ORDER — INSULIN LISPRO (1 UNIT DIAL) 100 UNIT/ML (KWIKPEN): 3.0000 [IU] | PEN_INJECTOR | Freq: Three times a day (TID) | SUBCUTANEOUS | 3 refills | Status: DC

## 2022-01-11 MED ORDER — INSULIN GLARGINE 100 UNIT/ML SOLOSTAR PEN: 16.0000 [IU] | PEN_INJECTOR | Freq: Every day | SUBCUTANEOUS | 3 refills | Status: DC

## 2022-01-11 NOTE — Progress Notes (Signed)
01/11/2022, 10:58 AM        Endocrinology follow-up note       Subjective:    Patient ID: Todd [name], male    DOB: 04-24-1956.  Todd [name] is here to follow-up for management of currently uncontrolled symptomatic diabetes requested by  Pcp, No.   Past Medical History:  Diagnosis Date   AKI (acute kidney injury) ([place])    Constipated    Diabetes mellitus without complication ([place])    Diarrhea    Elevated LFTs    Goiter    Gout    Hyperlipidemia    Hypertension    Leukocytosis    Reactive thrombocytosis    Right BKA infection ([place]) 08/2016   Right leg pain    Sepsis due to undetermined organism [facility])    Thyroid disease    Wound infection after surgery 08/2016    Past Surgical History:  Procedure Laterality Date   ABDOMINAL AORTOGRAM N/A 08/11/2016   Procedure: Abdominal Aortogram;  Surgeon: Waynetta Sandy, MD;  Location: [place] [facility];  Service: Cardiovascular;  Laterality: N/A;   ABDOMINAL AORTOGRAM W/LOWER EXTREMITY N/A 08/15/2016   Procedure: Abdominal Aortogram w/Lower Extremity;  Surgeon: Elam Dutch, MD;  Location: [place] [facility];  Service: Cardiovascular;  Laterality: N/A;   AMPUTATION Right 08/17/2016   Procedure: RIGHT BELOW KNEE AMPUTATION;  Surgeon: Elam Dutch, MD;  Location: [place];  Service: Vascular;  Laterality: Right;   AMPUTATION Right 09/12/2016   Procedure: AMPUTATION ABOVE KNEE;  Surgeon: Newt Minion, MD;  Location: [place];  Service: Orthopedics;  Laterality: Right;   AMPUTATION Left 08/12/2016   Procedure: LEFT BELOW KNEE AMPUTATION;  Surgeon: Newt Minion, MD;  Location: [place];  Service: Orthopedics;  Laterality: Left;   AMPUTATION Left 11/01/2017   Procedure: LEFT ABOVE KNEE AMPUTATION;  Surgeon: Newt Minion, MD;  Location: [place];  Service: Orthopedics;  Laterality: Left;   APPLICATION OF WOUND VAC Right 09/12/2016   Procedure: APPLICATION OF WOUND VAC  ABOVE KNEE;  Surgeon: Newt Minion, MD;  Location: [place];  Service: Orthopedics;  Laterality: Right;   BIOPSY  01/13/2021   Procedure: BIOPSY;  Surgeon: Rogene Houston, MD;  [facility];  Service: Endoscopy;;  esophageal   COLONOSCOPY WITH PROPOFOL N/A 01/13/2021   Procedure: COLONOSCOPY WITH PROPOFOL;  Surgeon: Rogene Houston, MD;  [facility];  Service: Endoscopy;  Laterality: N/A;   ESOPHAGOGASTRODUODENOSCOPY (EGD) WITH PROPOFOL N/A 01/13/2021   Procedure: ESOPHAGOGASTRODUODENOSCOPY (EGD) WITH PROPOFOL;  Surgeon: Rogene Houston, MD;  [facility];  Service: Endoscopy;  Laterality: N/A;   LOWER EXTREMITY ANGIOGRAPHY Bilateral 08/11/2016   Procedure: Lower Extremity Angiography;  Surgeon: Waynetta Sandy, MD;  Location: [place] [facility];  Service: Cardiovascular;  Laterality: Bilateral;   PERIPHERAL VASCULAR BALLOON ANGIOPLASTY Left 08/11/2016   Procedure: Peripheral Vascular Balloon Angioplasty;  Surgeon: Waynetta Sandy, MD;  Location: [place] [facility];  Service: Cardiovascular;  Laterality: Left;  SFA   POLYPECTOMY  01/13/2021   Procedure: POLYPECTOMY;  Surgeon: Rogene Houston, MD;  [facility];  Service: Endoscopy;;   THYROID SURGERY      Social History   Socioeconomic History  Marital status: Single    Spouse name: Not on file   Number of children: Not on file   Years of education: Not on file   Highest education level: Not on file  Occupational History   Occupation: retired    Comment: drove a Medical illustrator  Tobacco Use   Smoking status: Every Day    Packs/day: 0.25    Years: 45.00    Total pack years: 11.25    Types: Cigarettes    Last attempt to quit: 11/12/2014    Years since quitting: 7.1   Smokeless tobacco: Never  Vaping Use   Vaping Use: Never used  Substance and Sexual Activity   Alcohol use: Yes    Alcohol/week: 2.0 standard drinks of alcohol    Types: 1 Glasses of wine, 1 Cans of beer per week    Drug use: No   Sexual activity: Not Currently  Other Topics Concern   Not on file  Social History Narrative   09/23/20 - Lives alone, uses a wheelchair, double amputee, not married, no children - HHA comes in 3x per week to help him bathe and set up weekly meds.   Social Determinants of Health   Financial Resource Strain: Low Risk  (09/23/2020)   Overall Financial Resource Strain (CARDIA)    Difficulty of Paying Living Expenses: Not very hard  Food Insecurity: No Food Insecurity (09/23/2020)   Hunger Vital Sign    Worried About Running Out of Food in the Last Year: Never true    Ran Out of Food in the Last Year: Never true  Transportation Needs: No Transportation Needs (09/23/2020)   PRAPARE - Hydrologist (Medical): No    Lack of Transportation (Non-Medical): No  Physical Activity: Inactive (09/23/2020)   Exercise Vital Sign    Days of Exercise per Week: 0 days    Minutes of Exercise per Session: 0 min  Stress: No Stress Concern Present (09/23/2020)   [place]    Feeling of Stress : Only a little  Social Connections: Socially Isolated (09/23/2020)   Social Connection and Isolation Panel [NHANES]    Frequency of Communication with Friends and Family: More than three times a week    Frequency of Social Gatherings with Friends and Family: More than three times a week    Attends Religious Services: Never    Marine scientist or Organizations: No    Attends Music therapist: Never    Marital Status: Never married    Family History  Problem Relation Age of Onset   Heart disease Mother    Pneumonia Father    Diabetes Maternal Aunt    Diabetes Maternal Uncle     Outpatient Encounter Medications as of 01/11/2022  Medication Sig   allopurinol (ZYLOPRIM) 100 MG tablet Take 1 tablet (100 mg total) by mouth daily.   atorvastatin (LIPITOR) 40 MG tablet TAKE 1 TABLET BY MOUTH  ONCE DAILY AT 6 PM (Patient taking differently: Take 40 mg by mouth daily.)   folic acid (FOLVITE) 1 MG tablet Take 1 tablet (1 mg total) by mouth daily.   isosorbide mononitrate (IMDUR) 30 MG 24 hr tablet Take 1 tablet (30 mg total) by mouth daily. (Patient taking differently: Take 30 mg by mouth daily. 2 tablets by mouth once daily)   acetaminophen (TYLENOL) 325 MG tablet Take 2 tablets (650 mg total) by mouth every 6 (six) hours as needed for mild  pain (or Fever >/= 101).   apixaban (ELIQUIS) 5 MG TABS tablet Take 1 tablet (5 mg total) by mouth 2 (two) times daily.   Ascorbic Acid 500 MG CAPS Take 500 mg by mouth 2 (two) times daily.   calcium carbonate (TUMS - DOSED IN MG ELEMENTAL CALCIUM) 500 MG chewable tablet Chew 2 tablets (400 mg of elemental calcium total) by mouth 2 (two) times daily with a meal. (Patient taking differently: Chew 2 tablets by mouth daily. 2 tablets before meals)   cetirizine (ZYRTEC) 10 MG tablet Take 1 tablet (10 mg total) by mouth daily as needed for allergies. For allergy symptoms   cholecalciferol (VITAMIN D3) 25 MCG (1000 UNIT) tablet Take 1,000 Units by mouth daily.   furosemide (LASIX) 20 MG tablet Take 20 mg by mouth 2 (two) times daily.   glucose blood (ACCU-CHEK AVIVA PLUS) test strip Check blood sugars four times a day   hydrALAZINE (APRESOLINE) 25 MG tablet Take 1 tablet (25 mg total) by mouth 3 (three) times daily.   insulin glargine (LANTUS) 100 UNIT/ML Solostar Pen Inject 16 Units into the skin at bedtime.   insulin lispro (HUMALOG) 100 UNIT/ML KwikPen Inject 3-9 Units into the skin 3 (three) times daily. Sliding scale 90-150-3 units, 151-200- 4units, 200-250 5units, 251-300 6units, 301-350 7units, 351-400 8units,401-450 9units   levothyroxine (SYNTHROID) 75 MCG tablet Take 1 tablet (75 mcg total) by mouth daily before breakfast.   LORazepam (ATIVAN) 0.5 MG tablet Take 1 tablet (0.5 mg total) by mouth every 6 (six) hours as needed for anxiety.   megestrol  (MEGACE) 400 MG/10ML suspension Take 10 mLs (400 mg total) by mouth 2 (two) times daily. For appetite stimulation   metoprolol succinate (TOPROL-XL) 100 MG 24 hr tablet Take 1 tablet (100 mg total) by mouth daily. Take with or immediately following a meal.   Multiple Vitamin (MULTIVITAMIN WITH MINERALS) TABS tablet Take 1 tablet by mouth daily.   pantoprazole (PROTONIX) 40 MG tablet Take 1 tablet (40 mg total) by mouth 2 (two) times daily.   polyethylene glycol (MIRALAX / GLYCOLAX) 17 g packet Take 17 g by mouth daily.   potassium chloride SA (KLOR-CON M) 20 MEQ tablet Take 1 tablet (20 mEq total) by mouth daily.   PROTEIN PO Take 30 mLs by mouth daily. Liquid protein for low albumin   sertraline (ZOLOFT) 50 MG tablet Take 1 tablet (50 mg total) by mouth at bedtime.   sodium bicarbonate 650 MG tablet Take 650 mg by mouth 3 (three) times daily.   thiamine 100 MG tablet Take 1 tablet (100 mg total) by mouth daily.   triamcinolone 0.1%-silver sulfadiazine 1:1 cream mixture Apply topically as needed.   [DISCONTINUED] insulin glargine (LANTUS) 100 UNIT/ML Solostar Pen Inject 20 Units into the skin at bedtime.   [DISCONTINUED] insulin lispro (HUMALOG) 100 UNIT/ML KwikPen Inject 4-10 Units into the skin 3 (three) times daily. Sliding scale 200-250 2units, 251-300 4units, 301-350 6units, 351-400 8units,401-450 10units   [DISCONTINUED] levothyroxine (SYNTHROID) 88 MCG tablet Take 1 tablet (88 mcg total) by mouth daily before breakfast.   No facility-administered encounter medications on file as of 01/11/2022.    ALLERGIES: Allergies  Allergen Reactions   Lisinopril Other (See Comments)    Hyperkalemia / Renal failure    VACCINATION STATUS: Immunization History  Administered Date(s) Administered   Influenza,inj,Quad PF,6+ Mos 05/20/2015, 04/07/2017, 03/09/2018, 05/01/2019   Influenza-Unspecified 05/10/2011   PFIZER Comirnaty(Gray Top)Covid-19 Tri-Sucrose Vaccine 07/28/2020, 08/18/2020, 08/18/2020    PPD Test 09/16/2016  Diabetes He presents for his follow-up diabetic visit. He has type 2 diabetes mellitus. Onset time: Patient was diagnosed with A1c of greater than 14% in 2017 at the age of 87 years. His disease course has been stable. Pertinent negatives for hypoglycemia include no confusion, headaches, nervousness/anxiousness, pallor, seizures or tremors. Pertinent negatives for diabetes include no chest pain, no fatigue, no polydipsia, no polyphagia, no polyuria and no weakness. There are no hypoglycemic complications. Symptoms are stable. Diabetic complications include nephropathy and PVD. Risk factors for coronary artery disease include dyslipidemia, diabetes mellitus, male sex, sedentary lifestyle, tobacco exposure and hypertension. Current diabetic treatment includes intensive insulin program. He is compliant with treatment most of the time (now in SNF). His weight is fluctuating minimally. He is following a generally healthy diet. When asked about meal planning, he reported none. He has not had a previous visit with a dietitian. He never participates in exercise. His breakfast blood glucose range is generally 90-110 mg/dl. His lunch blood glucose range is generally 140-180 mg/dl. His dinner blood glucose range is generally 110-130 mg/dl. His bedtime blood glucose range is generally 110-130 mg/dl. (He presents today, accompanied by care attendant from the nursing home, with his logs showing mostly at target glycemic profile overall with tightening readings.  His POCT A1c today is 5.6%, improving drastically from last visit of 8.7%.  He does have some anemia which may skew his results some.  No significant hypoglycemia reported.) An ACE inhibitor/angiotensin II receptor blocker is not being taken. He does not see a podiatrist.Eye exam is current.  Hyperlipidemia This is a chronic problem. The current episode started more than 1 year ago. The problem is controlled. Recent lipid tests were  reviewed and are normal. Exacerbating diseases include chronic renal disease, diabetes and hypothyroidism. Factors aggravating his hyperlipidemia include beta blockers. Pertinent negatives include no chest pain, myalgias or shortness of breath. Current antihyperlipidemic treatment includes statins. The current treatment provides mild improvement of lipids. Compliance problems include adherence to diet and adherence to exercise.  Risk factors for coronary artery disease include diabetes mellitus, dyslipidemia, a sedentary lifestyle, male sex and hypertension.  Thyroid Problem Presents for follow-up visit. Onset time: He reports thyroidectomy in 2013 due to benign nodular goiter. Patient reports no anxiety, cold intolerance, constipation, depressed mood, diarrhea, fatigue, heat intolerance, palpitations or tremors. The symptoms have been stable. The treatment provided moderate relief. Prior procedures include thyroidectomy. His past medical history is significant for diabetes and hyperlipidemia.     Review of systems  Constitutional: + Minimally fluctuating body weight,  current There is no height or weight on file to calculate BMI. , no fatigue, no subjective hyperthermia, no subjective hypothermia Eyes: no blurry vision, no xerophthalmia ENT: no sore throat, no nodules palpated in throat, no dysphagia/odynophagia, no hoarseness Cardiovascular: no chest pain, no shortness of breath, no palpitations, no leg swelling Respiratory: no cough, no shortness of breath Gastrointestinal: no nausea/vomiting/diarrhea Musculoskeletal: no muscle/joint aches, hx of bilateral AKA- WC bound Skin: no rashes, no hyperemia Neurological: no tremors, no numbness, no tingling, no dizziness Psychiatric: no depression, no anxiety  Objective:    BP (!) 143/67   Pulse 60   Wt Readings from Last 3 Encounters:  10/06/21 96 lb (43.5 kg)  05/29/21 146 lb 9.7 oz (66.5 kg)  05/24/21 130 lb (59 kg)    BP Readings from Last  3 Encounters:  01/11/22 (!) 143/67  10/06/21 140/68  09/08/21 (!) 103/56    Review of systems  Constitutional: +  Minimally fluctuating body weight,  current There is no height or weight on file to calculate BMI. , no fatigue, no subjective hyperthermia, no subjective hypothermia Eyes: no blurry vision, no xerophthalmia ENT: no sore throat, no nodules palpated in throat, no dysphagia/odynophagia, no hoarseness Cardiovascular: no chest pain, no shortness of breath, no palpitations, no leg swelling Respiratory: no cough, no shortness of breath Gastrointestinal: no nausea/vomiting/diarrhea Musculoskeletal: no muscle/joint aches, hx of bilateral AKA- WC bound Skin: no rashes, no hyperemia, nicotinic discoloration to fingernails bilaterally Neurological: no tremors, no numbness, no tingling, no dizziness Psychiatric: no depression, no anxiety    Diabetic Labs (most recent): Lab Results  Component Value Date   HGBA1C 8.7 (A) 09/08/2021   HGBA1C 7.9 (H) 04/20/2021   HGBA1C 6.0 (H) 12/04/2020     Lipid Panel ( most recent) Lipid Panel     Component Value Date/Time   CHOL 59 (L) 09/22/2020 0923   TRIG 60 09/22/2020 0923   HDL 25 (L) 09/22/2020 0923   CHOLHDL 2.4 09/22/2020 0923   LDLCALC 19 09/22/2020 0923     Recent Results (from the past 2160 hour(s))  TSH     Status: Abnormal   Collection Time: 01/07/22 12:00 AM  Result Value Ref Range   TSH 0.06 (A) 0.41 - 5.90    Comment: FT4-1.69        Assessment & Plan:   1) Type 2 diabetes mellitus with stage 4 chronic kidney disease, with long-term current use of insulin (HCC)  - Todd [name] has currently uncontrolled symptomatic type 2 DM since 66 years of age.  He presents today, accompanied by care attendant from the nursing home, with his logs showing mostly at target glycemic profile overall with tightening readings.  His POCT A1c today is 5.6%, improving drastically from last visit of 8.7%.  He does have some anemia  which may skew his results some.  No significant hypoglycemia reported.  -This patient has history of heavy alcohol use/abuse which might have contributed to the pathogenesis of his diabetes. -I had a long discussion with him about the progressive nature of type 2 diabetes and the pathology behind its complications.   -his diabetes is complicated by peripheral arterial disease status post bilateral above-knee amputation -sedentary life, renal insufficiency, and he remains at extremely high risk for more acute and chronic complications which include CAD, CVA, CKD, retinopathy, and neuropathy. These are all discussed in detail with him.  - Nutritional counseling repeated at each appointment due to patients tendency to fall back in to old habits.  - The patient admits there is a room for improvement in their diet and drink choices. -  Suggestion is made for the patient to avoid simple carbohydrates from their diet including Cakes, Sweet Desserts / Pastries, Ice Cream, Soda (diet and regular), Sweet Tea, Candies, Chips, Cookies, Sweet Pastries, Store Bought Juices, Alcohol in Excess of 1-2 drinks a day, Artificial Sweeteners, Coffee Creamer, and "Sugar-free" Products. This will help patient to have stable blood glucose profile and potentially avoid unintended weight gain.   - I encouraged the patient to switch to unprocessed or minimally processed complex starch and increased protein intake (animal or plant source), fruits, and vegetables.   - Patient is advised to stick to a routine mealtimes to eat 3 meals a day and avoid unnecessary snacks (to snack only to correct hypoglycemia).  - I have approached him with the following individualized plan to manage diabetes and patient agrees:   -#1 priority in his  care will be to avoid hypoglycemia.  -Given his tightening glycemic profile, he is advised to lower his Lantus to 16 units SQ nightly and lower Humalog to 3-9 units SQ TID with meals if glucose is  above 90 and he is eating (Specific instructions on how to titrate insulin dosage based on glucose readings given to patient in writing).   -He is encouraged to continue monitoring blood glucose 4 times daily, before meals and before bed, and call the clinic if readings are less than 70 or greater than 200 for 3 tests in a row.  -In this patient with high likelihood of pancreatic diabetes, tight glycemic profile is not advised as he may be lacking endogenous glucagon response which puts him at high risk for hypoglycemia.  - he is not a candidate for Metformin, SGLT2 inhibitors due to concurrent renal insufficiency. -He is not a candidate for incretin therapy due to his background history of alcoholism with possible risk of pancreatitis.  - Patient specific target  A1c;  LDL, HDL, Triglycerides, and  Waist Circumference were discussed in detail.  2) Blood Pressure /Hypertension: -His blood pressure is controlled to target. He is advised to continue Metoprolol 100 mg po daily, Hydralazine 25 mg TID and Lasix 20 mg po daily.  3) Lipids/Hyperlipidemia: His most recent lipid panel from 09/07/21 shows controlled LDL of 20.  He is advised to continue Lipitor 40 mg po daily at bedtime.  Side effects and precautions discussed with him.  He is advised to avoid fried foods and butter.  4)  Weight/Diet: -   His There is no height or weight on file to calculate BMI.  CDE Consult has been initiated . Exercise, and detailed carbohydrates information provided  -  detailed on discharge instructions.  He cannot exercise optimally, wheelchair-bound due to bilateral above-knee amputations.  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  His previsit thyroid function tests are consistent with over-replacement.  He is advised to lower his Levothyroxine to 75 mcg po daily before breakfast.     - We discussed about the correct intake of his  thyroid hormone, on empty stomach at fasting, with water, separated by at least 30 minutes from breakfast and other medications,  and separated by more than 4 hours from calcium, iron, multivitamins, acid reflux medications (PPIs). -Patient is made aware of the fact that thyroid hormone replacement is needed for life, dose to be adjusted by periodic monitoring of thyroid function tests.  6) Vitamin D deficiency: -Last vitamin D level on 08/06/20 was 52.3.  He is advised to continue vitamin D3 5000 units daily as maintenance dose until next measurement.  7) Chronic Care/Health Maintenance: -he is on statin medications and is encouraged to initiate and continue to follow up with Ophthalmology, Dentist,  Podiatrist at least yearly or according to recommendations, and advised to stay away from smoking. I have recommended yearly flu vaccine and pneumonia vaccine at least every 5 years;  and  sleep for at least 7 hours a day.  -He cannot exercise optimally- WC bound from bilateral AKA.   - he is  advised to maintain close follow up with Pcp, No for primary care needs, as well as his other providers for optimal and coordinated care.     I spent 45 minutes in the care of the patient today including review of labs from [facility], Lipids, Thyroid Function, Hematology (current and previous including abstractions from other facilities);  face-to-face time discussing  his blood glucose readings/logs, discussing hypoglycemia and hyperglycemia episodes and symptoms, medications doses, his options of short and long term treatment based on the latest standards of care / guidelines;  discussion about incorporating lifestyle medicine;  and documenting the encounter. Risk reduction counseling performed per USPSTF guidelines to reduce obesity and cardiovascular risk factors.     Please refer to Patient Instructions for Blood Glucose Monitoring and Insulin/Medications Dosing Guide"  in media tab for additional information.  Please  also refer to " Patient Self Inventory" in the Media  tab for reviewed elements of pertinent patient history.  [name] participated in the discussions, expressed understanding, and voiced agreement with the above plans.  All questions were answered to his satisfaction. he is encouraged to contact clinic should he have any questions or concerns prior to his return visit.   Follow up plan: - Return in about 4 months (around 05/13/2022) for Diabetes F/U with A1c in office, Thyroid follow up, Previsit labs, Bring meter and logs.  Rayetta Pigg, [facility] [address] [place], [place] [address] Phone: [phone] Fax: [phone]  01/11/2022, 10:58 AM

## 2022-01-11 NOTE — Progress Notes (Unsigned)
[facility] [address] [place] [address]   CLINIC:  Medical Oncology/Hematology  CONSULT NOTE  Patient Care Team: Pcp, No as PCP - General Minus Breeding, MD as PCP - Cardiology (Cardiology) [name], MD as PCP - Electrophysiology (Cardiology) Newt Minion, MD as Consulting Physician (Orthopedic Surgery) Cassandria Anger, MD as Consulting Physician (Endocrinology) Celestia Khat, OD (Optometry)  CHIEF COMPLAINTS/PURPOSE OF CONSULTATION:  Thrombocytosis and leukocytosis  HISTORY OF PRESENTING ILLNESS:  Todd [name] 66 y.o. male is here at the request of Malena Catholic FNP [facility]) for evaluation of elevated platelets.  Labs sent by SNF from 11/15/2021 show elevated platelets 620, with normal WBC 10.0, and Hgb 10.8/MCV 81.3.  Previous platelets (11/05/2021) elevated at 556.  CMP (09/14/2021) showed creatinine 3.68/GFR 19.  Review of prior labs in EPIC shows intermittently elevated platelets since at least 2017, with maximum platelets 1,009,000 on 08/30/2016.  Patient also has had intermittent leukocytosis (primarily neutrophilic) since at least 3825, with highest WBC recorded at 30.6 on 09/11/2016.   - - - - - - - - - - - - - - - - - -   THROMBOCYTOSIS / LEUKOCYTOSIS *** Infection or inflammation? *** Smoking *** Connective tissue/autoimmune disorder? *** Blood loss or iron deficiency? *** History of splenectomy *** Cancer? *** Steroids *** Aspirin?  *** Blood clots *** Aquagenic pruritus, Raynaud's, erythromelalgia (itching/burning pain) *** Vasomotor symptoms (dizziness, tinnitus, blurry vision, strokelike symptoms, neuropathy) *** B symptoms *** Abdominal pain (LUQ), nausea, early satiety   - - - - - - - - - - - - - - - - - -   PMH: Alcohol abuse in remission, chronic diastolic CHF, pressure sores/cutaneous ulcers, CKD stage IIIb/IV, hypertension, GERD, anemia of chronic disease, hypothyroidism, depression, atrial  fibrillation (on Eliquis), type 2 diabetes mellitus, hyperlipidemia, gout, coronary artery disease, history of substance abuse, s/p bilateral AKA, history of verbally abusive behavior ***  SOCIAL: ***  FAMILY: ***    MEDICAL HISTORY:  Past Medical History:  Diagnosis Date   AKI (acute kidney injury) ([place])    Constipated    Diabetes mellitus without complication (HCC)    Diarrhea    Elevated LFTs    Goiter    Gout    Hyperlipidemia    Hypertension    Leukocytosis    Reactive thrombocytosis    Right BKA infection ([place]) 08/2016   Right leg pain    Sepsis due to undetermined organism ([place])    Thyroid disease    Wound infection after surgery 08/2016    SURGICAL HISTORY: Past Surgical History:  Procedure Laterality Date   ABDOMINAL AORTOGRAM N/A 08/11/2016   Procedure: Abdominal Aortogram;  Surgeon: Waynetta Sandy, MD;  Location: [place] [facility];  Service: Cardiovascular;  Laterality: N/A;   ABDOMINAL AORTOGRAM W/LOWER EXTREMITY N/A 08/15/2016   Procedure: Abdominal Aortogram w/Lower Extremity;  Surgeon: Elam Dutch, MD;  Location: [place] [facility];  Service: Cardiovascular;  Laterality: N/A;   AMPUTATION Right 08/17/2016   Procedure: RIGHT BELOW KNEE AMPUTATION;  Surgeon: Elam Dutch, MD;  Location: [place];  Service: Vascular;  Laterality: Right;   AMPUTATION Right 09/12/2016   Procedure: AMPUTATION ABOVE KNEE;  Surgeon: Newt Minion, MD;  Location: [place];  Service: Orthopedics;  Laterality: Right;   AMPUTATION Left 08/12/2016   Procedure: LEFT BELOW KNEE AMPUTATION;  Surgeon: Newt Minion, MD;  Location: [place];  Service: Orthopedics;  Laterality: Left;  AMPUTATION Left 11/01/2017   Procedure: LEFT ABOVE KNEE AMPUTATION;  Surgeon: Newt Minion, MD;  Location: [place];  Service: Orthopedics;  Laterality: Left;   APPLICATION OF WOUND VAC Right 09/12/2016   Procedure: APPLICATION OF WOUND VAC ABOVE KNEE;  Surgeon: Newt Minion, MD;  Location: [place];  Service:  Orthopedics;  Laterality: Right;   BIOPSY  01/13/2021   Procedure: BIOPSY;  Surgeon: Rogene Houston, MD;  [facility];  Service: Endoscopy;;  esophageal   COLONOSCOPY WITH PROPOFOL N/A 01/13/2021   Procedure: COLONOSCOPY WITH PROPOFOL;  Surgeon: Rogene Houston, MD;  [facility];  Service: Endoscopy;  Laterality: N/A;   ESOPHAGOGASTRODUODENOSCOPY (EGD) WITH PROPOFOL N/A 01/13/2021   Procedure: ESOPHAGOGASTRODUODENOSCOPY (EGD) WITH PROPOFOL;  Surgeon: Rogene Houston, MD;  [facility];  Service: Endoscopy;  Laterality: N/A;   LOWER EXTREMITY ANGIOGRAPHY Bilateral 08/11/2016   Procedure: Lower Extremity Angiography;  Surgeon: Waynetta Sandy, MD;  Location: [place] [facility];  Service: Cardiovascular;  Laterality: Bilateral;   PERIPHERAL VASCULAR BALLOON ANGIOPLASTY Left 08/11/2016   Procedure: Peripheral Vascular Balloon Angioplasty;  Surgeon: Waynetta Sandy, MD;  Location: [place] [facility];  Service: Cardiovascular;  Laterality: Left;  SFA   POLYPECTOMY  01/13/2021   Procedure: POLYPECTOMY;  Surgeon: Rogene Houston, MD;  [facility];  Service: Endoscopy;;   THYROID SURGERY      SOCIAL HISTORY: Social History   Socioeconomic History   Marital status: Single    Spouse name: Not on file   Number of children: Not on file   Years of education: Not on file   Highest education level: Not on file  Occupational History   Occupation: retired    Comment: drove a Medical illustrator  Tobacco Use   Smoking status: Every Day    Packs/day: 0.25    Years: 45.00    Total pack years: 11.25    Types: Cigarettes    Last attempt to quit: 11/12/2014    Years since quitting: 7.1   Smokeless tobacco: Never  Vaping Use   Vaping Use: Never used  Substance and Sexual Activity   Alcohol use: Yes    Alcohol/week: 2.0 standard drinks of alcohol    Types: 1 Glasses of wine, 1 Cans of beer per week   Drug use: No   Sexual activity: Not Currently  Other  Topics Concern   Not on file  Social History Narrative   09/23/20 - Lives alone, uses a wheelchair, double amputee, not married, no children - HHA comes in 3x per week to help him bathe and set up weekly meds.   Social Determinants of Health   Financial Resource Strain: Low Risk  (09/23/2020)   Overall Financial Resource Strain (CARDIA)    Difficulty of Paying Living Expenses: Not very hard  Food Insecurity: No Food Insecurity (09/23/2020)   Hunger Vital Sign    Worried About Running Out of Food in the Last Year: Never true    Ran Out of Food in the Last Year: Never true  Transportation Needs: No Transportation Needs (09/23/2020)   PRAPARE - Hydrologist (Medical): No    Lack of Transportation (Non-Medical): No  Physical Activity: Inactive (09/23/2020)   Exercise Vital Sign    Days of Exercise per Week: 0 days    Minutes of Exercise per Session: 0 min  Stress: No Stress Concern Present (09/23/2020)   [place]  Stress Questionnaire    Feeling of Stress : Only a little  Social Connections: Socially Isolated (09/23/2020)   Social Connection and Isolation Panel [NHANES]    Frequency of Communication with Friends and Family: More than three times a week    Frequency of Social Gatherings with Friends and Family: More than three times a week    Attends Religious Services: Never    Marine scientist or Organizations: No    Attends Archivist Meetings: Never    Marital Status: Never married  Intimate Partner Violence: Not At Risk (09/23/2020)   Humiliation, Afraid, Rape, and Kick questionnaire    Fear of Current or Ex-Partner: No    Emotionally Abused: No    Physically Abused: No    Sexually Abused: No    FAMILY HISTORY: Family History  Problem Relation Age of Onset   Heart disease Mother    Pneumonia Father    Diabetes Maternal Aunt    Diabetes Maternal Uncle     ALLERGIES:  is allergic to  lisinopril.  MEDICATIONS:  Current Outpatient Medications  Medication Sig Dispense Refill   acetaminophen (TYLENOL) 325 MG tablet Take 2 tablets (650 mg total) by mouth every 6 (six) hours as needed for mild pain (or Fever >/= 101).     allopurinol (ZYLOPRIM) 100 MG tablet Take 1 tablet (100 mg total) by mouth daily. 30 tablet 1   apixaban (ELIQUIS) 5 MG TABS tablet Take 1 tablet (5 mg total) by mouth 2 (two) times daily. 60 tablet 5   Ascorbic Acid 500 MG CAPS Take 500 mg by mouth 2 (two) times daily.     atorvastatin (LIPITOR) 40 MG tablet TAKE 1 TABLET BY MOUTH ONCE DAILY AT 6 PM (Patient taking differently: Take 40 mg by mouth daily.) 30 tablet 1   calcium carbonate (TUMS - DOSED IN MG ELEMENTAL CALCIUM) 500 MG chewable tablet Chew 2 tablets (400 mg of elemental calcium total) by mouth 2 (two) times daily with a meal. (Patient taking differently: Chew 2 tablets by mouth daily. 2 tablets before meals) 60 tablet 4   cetirizine (ZYRTEC) 10 MG tablet Take 1 tablet (10 mg total) by mouth daily as needed for allergies. For allergy symptoms 90 tablet 1   cholecalciferol (VITAMIN D3) 25 MCG (1000 UNIT) tablet Take 1,000 Units by mouth daily.     folic acid (FOLVITE) 1 MG tablet Take 1 tablet (1 mg total) by mouth daily. 30 tablet 5   furosemide (LASIX) 20 MG tablet Take 20 mg by mouth 2 (two) times daily.     glucose blood (ACCU-CHEK AVIVA PLUS) test strip Check blood sugars four times a day 400 each 3   hydrALAZINE (APRESOLINE) 25 MG tablet Take 1 tablet (25 mg total) by mouth 3 (three) times daily.     insulin glargine (LANTUS) 100 UNIT/ML Solostar Pen Inject 16 Units into the skin at bedtime. 30 mL 3   insulin lispro (HUMALOG) 100 UNIT/ML KwikPen Inject 3-9 Units into the skin 3 (three) times daily. Sliding scale 90-150-3 units, 151-200- 4units, 200-250 5units, 251-300 6units, 301-350 7units, 351-400 8units,401-450 9units 15 mL 3   isosorbide mononitrate (IMDUR) 30 MG 24 hr tablet Take 1 tablet (30  mg total) by mouth daily. (Patient taking differently: Take 30 mg by mouth daily. 2 tablets by mouth once daily)     levothyroxine (SYNTHROID) 75 MCG tablet Take 1 tablet (75 mcg total) by mouth daily before breakfast. 90 tablet 1   LORazepam (ATIVAN)  0.5 MG tablet Take 1 tablet (0.5 mg total) by mouth every 6 (six) hours as needed for anxiety. 4 tablet 0   megestrol (MEGACE) 400 MG/10ML suspension Take 10 mLs (400 mg total) by mouth 2 (two) times daily. For appetite stimulation 500 mL 1   metoprolol succinate (TOPROL-XL) 100 MG 24 hr tablet Take 1 tablet (100 mg total) by mouth daily. Take with or immediately following a meal.     Multiple Vitamin (MULTIVITAMIN WITH MINERALS) TABS tablet Take 1 tablet by mouth daily.     pantoprazole (PROTONIX) 40 MG tablet Take 1 tablet (40 mg total) by mouth 2 (two) times daily.     polyethylene glycol (MIRALAX / GLYCOLAX) 17 g packet Take 17 g by mouth daily. 14 each 0   potassium chloride SA (KLOR-CON M) 20 MEQ tablet Take 1 tablet (20 mEq total) by mouth daily.     PROTEIN PO Take 30 mLs by mouth daily. Liquid protein for low albumin     sertraline (ZOLOFT) 50 MG tablet Take 1 tablet (50 mg total) by mouth at bedtime. 30 tablet 1   sodium bicarbonate 650 MG tablet Take 650 mg by mouth 3 (three) times daily.     thiamine 100 MG tablet Take 1 tablet (100 mg total) by mouth daily. 30 tablet 3   triamcinolone 0.1%-silver sulfadiazine 1:1 cream mixture Apply topically as needed.     No current facility-administered medications for this visit.    REVIEW OF SYSTEMS:   Review of Systems - Oncology    PHYSICAL EXAMINATION: ECOG PERFORMANCE STATUS: {CHL ONC ECOG PS:[id number]}  There were no vitals filed for this visit. There were no vitals filed for this visit.  Physical Exam    LABORATORY DATA:  I have reviewed the data as listed Recent Results (from the past 2160 hour(s))  TSH     Status: Abnormal   Collection Time: 01/07/22 12:00 AM  Result Value  Ref Range   TSH 0.06 (A) 0.41 - 5.90    Comment: FT4-1.69  HgB A1c     Status: Normal   Collection Time: 01/11/22 10:59 AM  Result Value Ref Range   Hemoglobin A1C 5.6 4.0 - 5.6 %   HbA1c POC (<> result, manual entry)     HbA1c, POC (prediabetic range)     HbA1c, POC (controlled diabetic range)      RADIOGRAPHIC STUDIES: I have personally reviewed the radiological images as listed and agreed with the findings in the report. No results found.  ASSESSMENT & PLAN: 1.  Thrombocytosis & neutrophilic leukocytosis - *** - PLAN: ***    2.  Normocytic anemia - *** - PLAN: ***    3.  Other history - ***   PLAN SUMMARY & DISPOSITION: ***  All questions were answered. The patient knows to call the clinic with any problems, questions or concerns.   Medical decision making: ***  Time spent on visit: I spent {CHL ONC TIME VISIT - [id number]} counseling the patient face to face. The total time spent in the appointment was {CHL ONC TIME VISIT - NKNLZ:[id number]} and more than 50% was on counseling.  I, Tarri Abernethy PA-C, have seen this patient in conjunction with Dr. Derek Jack.  Greater than 50% of visit was performed by Dr. Delton Coombes.   Harriett Rush, PA-C *** ***  DR. Delton CoombesMarland Kitchen

## 2022-01-12 ENCOUNTER — Inpatient Hospital Stay: Payer: Medicare (Managed Care) | Attending: Hematology | Admitting: Hematology

## 2022-01-12 ENCOUNTER — Inpatient Hospital Stay: Payer: Medicare (Managed Care)

## 2022-01-12 VITALS — BP 141/66 | HR 61 | Temp 97.8°F | Resp 18

## 2022-01-12 DIAGNOSIS — Z993 Dependence on wheelchair: Secondary | ICD-10-CM | POA: Insufficient documentation

## 2022-01-12 DIAGNOSIS — F17210 Nicotine dependence, cigarettes, uncomplicated: Secondary | ICD-10-CM | POA: Insufficient documentation

## 2022-01-12 DIAGNOSIS — Z888 Allergy status to other drugs, medicaments and biological substances status: Secondary | ICD-10-CM | POA: Diagnosis not present

## 2022-01-12 DIAGNOSIS — K5730 Diverticulosis of large intestine without perforation or abscess without bleeding: Secondary | ICD-10-CM | POA: Diagnosis not present

## 2022-01-12 DIAGNOSIS — N1832 Chronic kidney disease, stage 3b: Secondary | ICD-10-CM | POA: Insufficient documentation

## 2022-01-12 DIAGNOSIS — D72828 Other elevated white blood cell count: Secondary | ICD-10-CM | POA: Diagnosis not present

## 2022-01-12 DIAGNOSIS — Z7901 Long term (current) use of anticoagulants: Secondary | ICD-10-CM | POA: Diagnosis not present

## 2022-01-12 DIAGNOSIS — D72829 Elevated white blood cell count, unspecified: Secondary | ICD-10-CM | POA: Diagnosis not present

## 2022-01-12 DIAGNOSIS — E785 Hyperlipidemia, unspecified: Secondary | ICD-10-CM | POA: Diagnosis not present

## 2022-01-12 DIAGNOSIS — I480 Paroxysmal atrial fibrillation: Secondary | ICD-10-CM | POA: Diagnosis not present

## 2022-01-12 DIAGNOSIS — Z89611 Acquired absence of right leg above knee: Secondary | ICD-10-CM | POA: Diagnosis not present

## 2022-01-12 DIAGNOSIS — D649 Anemia, unspecified: Secondary | ICD-10-CM | POA: Diagnosis not present

## 2022-01-12 DIAGNOSIS — Z89612 Acquired absence of left leg above knee: Secondary | ICD-10-CM | POA: Diagnosis not present

## 2022-01-12 DIAGNOSIS — Z794 Long term (current) use of insulin: Secondary | ICD-10-CM | POA: Diagnosis not present

## 2022-01-12 DIAGNOSIS — Z79890 Hormone replacement therapy (postmenopausal): Secondary | ICD-10-CM | POA: Insufficient documentation

## 2022-01-12 DIAGNOSIS — Z602 Problems related to living alone: Secondary | ICD-10-CM | POA: Insufficient documentation

## 2022-01-12 DIAGNOSIS — K2100 Gastro-esophageal reflux disease with esophagitis, without bleeding: Secondary | ICD-10-CM | POA: Insufficient documentation

## 2022-01-12 DIAGNOSIS — D122 Benign neoplasm of ascending colon: Secondary | ICD-10-CM | POA: Diagnosis not present

## 2022-01-12 DIAGNOSIS — D120 Benign neoplasm of cecum: Secondary | ICD-10-CM | POA: Diagnosis not present

## 2022-01-12 DIAGNOSIS — D75839 Thrombocytosis, unspecified: Secondary | ICD-10-CM | POA: Diagnosis not present

## 2022-01-12 DIAGNOSIS — Z87891 Personal history of nicotine dependence: Secondary | ICD-10-CM

## 2022-01-12 DIAGNOSIS — Z79899 Other long term (current) drug therapy: Secondary | ICD-10-CM | POA: Insufficient documentation

## 2022-01-12 DIAGNOSIS — Z8719 Personal history of other diseases of the digestive system: Secondary | ICD-10-CM | POA: Insufficient documentation

## 2022-01-12 DIAGNOSIS — I130 Hypertensive heart and chronic kidney disease with heart failure and stage 1 through stage 4 chronic kidney disease, or unspecified chronic kidney disease: Secondary | ICD-10-CM | POA: Diagnosis not present

## 2022-01-12 DIAGNOSIS — I5032 Chronic diastolic (congestive) heart failure: Secondary | ICD-10-CM | POA: Insufficient documentation

## 2022-01-12 DIAGNOSIS — E1122 Type 2 diabetes mellitus with diabetic chronic kidney disease: Secondary | ICD-10-CM | POA: Insufficient documentation

## 2022-01-12 DIAGNOSIS — Z836 Family history of other diseases of the respiratory system: Secondary | ICD-10-CM | POA: Insufficient documentation

## 2022-01-12 DIAGNOSIS — Z8249 Family history of ischemic heart disease and other diseases of the circulatory system: Secondary | ICD-10-CM | POA: Insufficient documentation

## 2022-01-12 DIAGNOSIS — Z833 Family history of diabetes mellitus: Secondary | ICD-10-CM | POA: Insufficient documentation

## 2022-01-12 LAB — IRON AND TIBC
Iron: 55 ug/dL (ref 45–182)
Saturation Ratios: 20 % (ref 17.9–39.5)
TIBC: 279 ug/dL (ref 250–450)
UIBC: 224 ug/dL

## 2022-01-12 LAB — CBC WITH DIFFERENTIAL/PLATELET
Abs Immature Granulocytes: 0.02 10*3/uL (ref 0.00–0.07)
Basophils Absolute: 0 10*3/uL (ref 0.0–0.1)
Basophils Relative: 0 %
Eosinophils Absolute: 0.5 10*3/uL (ref 0.0–0.5)
Eosinophils Relative: 5 %
HCT: 38.5 % — ABNORMAL LOW (ref 39.0–52.0)
Hemoglobin: 12.3 g/dL — ABNORMAL LOW (ref 13.0–17.0)
Immature Granulocytes: 0 %
Lymphocytes Relative: 26 %
Lymphs Abs: 2.4 10*3/uL (ref 0.7–4.0)
MCH: 27 pg (ref 26.0–34.0)
MCHC: 31.9 g/dL (ref 30.0–36.0)
MCV: 84.6 fL (ref 80.0–100.0)
Monocytes Absolute: 0.5 10*3/uL (ref 0.1–1.0)
Monocytes Relative: 5 %
Neutro Abs: 5.8 10*3/uL (ref 1.7–7.7)
Neutrophils Relative %: 64 %
Platelets: 541 10*3/uL — ABNORMAL HIGH (ref 150–400)
RBC: 4.55 MIL/uL (ref 4.22–5.81)
RDW: 17 % — ABNORMAL HIGH (ref 11.5–15.5)
WBC: 9.2 10*3/uL (ref 4.0–10.5)
nRBC: 0 % (ref 0.0–0.2)

## 2022-01-12 LAB — COMPREHENSIVE METABOLIC PANEL
ALT: 32 U/L (ref 0–44)
AST: 45 U/L — ABNORMAL HIGH (ref 15–41)
Albumin: 2.9 g/dL — ABNORMAL LOW (ref 3.5–5.0)
Alkaline Phosphatase: 228 U/L — ABNORMAL HIGH (ref 38–126)
Anion gap: 8 (ref 5–15)
BUN: 25 mg/dL — ABNORMAL HIGH (ref 8–23)
CO2: 22 mmol/L (ref 22–32)
Calcium: 8.2 mg/dL — ABNORMAL LOW (ref 8.9–10.3)
Chloride: 116 mmol/L — ABNORMAL HIGH (ref 98–111)
Creatinine, Ser: 1.97 mg/dL — ABNORMAL HIGH (ref 0.61–1.24)
GFR, Estimated: 37 mL/min — ABNORMAL LOW (ref >60)
Glucose, Bld: 151 mg/dL — ABNORMAL HIGH (ref 70–99)
Potassium: 3.7 mmol/L (ref 3.5–5.1)
Sodium: 146 mmol/L — ABNORMAL HIGH (ref 135–145)
Total Bilirubin: 1 mg/dL (ref 0.3–1.2)
Total Protein: 7.8 g/dL (ref 6.5–8.1)

## 2022-01-12 LAB — FERRITIN: Ferritin: 675 ng/mL — ABNORMAL HIGH (ref 24–336)

## 2022-01-12 LAB — C-REACTIVE PROTEIN: CRP: 1 mg/dL — ABNORMAL HIGH (ref <1.0)

## 2022-01-12 LAB — VITAMIN B12: Vitamin B-12: 640 pg/mL (ref 180–914)

## 2022-01-12 LAB — SEDIMENTATION RATE: Sed Rate: 45 mm/hr — ABNORMAL HIGH (ref 0–16)

## 2022-01-12 LAB — LACTATE DEHYDROGENASE: LDH: 133 U/L (ref 98–192)

## 2022-01-12 LAB — FOLATE: Folate: >40 ng/mL (ref >5.9)

## 2022-01-12 NOTE — Patient Instructions (Signed)
[place] at [facility] Discharge Instructions  You were seen today by Dr. Delton Coombes & Tarri Abernethy PA-C for your elevated white blood cells and platelets.  We will check labs today to help [place] determine the cause of your blood abnormalities.  We will also schedule you for a CT scan of your chest to screen for lung cancer due to your history of smoking cigarettes.  FOLLOW-UP APPOINTMENT: We will schedule follow-up appointment in about 4 weeks to discuss results.   - - - - - - - - - - - - - - - - - -    Thank you for choosing [place] at [facility] to provide your oncology and hematology care.  To afford each patient quality time with our provider, please arrive at least 15 minutes before your scheduled appointment time.   If you have a lab appointment with the [facility] please come in thru the Main Entrance and check in at the main information desk.  You need to re-schedule your appointment should you arrive 10 or more minutes late.  We strive to give you quality time with our providers, and arriving late affects you and other patients whose appointments are after yours.  Also, if you no show three or more times for appointments you may be dismissed from the clinic at the providers discretion.     Again, thank you for choosing [facility].  Our hope is that these requests will decrease the amount of time that you wait before being seen by our physicians.       _____________________________________________________________  Should you have questions after your visit to [facility], please contact our office at [phone] and follow the prompts.  Our office hours are 8:00 a.m. and 4:30 p.m. Monday - Friday.  Please note that voicemails left after 4:00 p.m. may not be returned until the following business day.  We are closed weekends and major holidays.  You do have access to a nurse 24-7, just call the main  number to the clinic [phone] and do not press any options, hold on the line and a nurse will answer the phone.    For prescription refill requests, have your pharmacy contact our office and allow 72 hours.    Due to Covid, you will need to wear a mask upon entering the hospital. If you do not have a mask, a mask will be given to you at the Main Entrance upon arrival. For doctor visits, patients may have 1 support person age 28 or older with them. For treatment visits, patients can not have anyone with them due to social distancing guidelines and our immunocompromised population.

## 2022-01-13 LAB — KAPPA/LAMBDA LIGHT CHAINS
Kappa free light chain: 144 mg/L — ABNORMAL HIGH (ref 3.3–19.4)
Kappa, lambda light chain ratio: 1.59 (ref 0.26–1.65)
Lambda free light chains: 90.7 mg/L — ABNORMAL HIGH (ref 5.7–26.3)

## 2022-01-13 LAB — RHEUMATOID FACTOR: Rheumatoid fact SerPl-aCnc: 10.7 IU/mL (ref <14.0)

## 2022-01-13 LAB — ANA: Anti Nuclear Antibody (ANA): NEGATIVE

## 2022-01-14 LAB — PROTEIN ELECTROPHORESIS, SERUM
A/G Ratio: 0.7 (ref 0.7–1.7)
Albumin ELP: 3.1 g/dL (ref 2.9–4.4)
Alpha-1-Globulin: 0.2 g/dL (ref 0.0–0.4)
Alpha-2-Globulin: 0.8 g/dL (ref 0.4–1.0)
Beta Globulin: 1.1 g/dL (ref 0.7–1.3)
Gamma Globulin: 2.1 g/dL — ABNORMAL HIGH (ref 0.4–1.8)
Globulin, Total: 4.2 g/dL — ABNORMAL HIGH (ref 2.2–3.9)
Total Protein ELP: 7.3 g/dL (ref 6.0–8.5)

## 2022-01-14 LAB — METHYLMALONIC ACID, SERUM: Methylmalonic Acid, Quantitative: 508 nmol/L — ABNORMAL HIGH (ref 0–378)

## 2022-01-15 LAB — BCR-ABL1 FISH
Cells Analyzed: 200
Cells Counted: 200

## 2022-01-15 LAB — COPPER, SERUM: Copper: 110 ug/dL (ref 69–132)

## 2022-01-17 LAB — IMMUNOFIXATION ELECTROPHORESIS
IgA: 642 mg/dL — ABNORMAL HIGH (ref 61–437)
IgG (Immunoglobin G), Serum: 2124 mg/dL — ABNORMAL HIGH (ref 603–1613)
IgM (Immunoglobulin M), Srm: 238 mg/dL — ABNORMAL HIGH (ref 20–172)
Total Protein ELP: 7.5 g/dL (ref 6.0–8.5)

## 2022-01-18 LAB — JAK2 V617F RFX CALR/MPL/E12-15

## 2022-01-18 LAB — CALR +MPL + E12-E15  (REFLEX)

## 2022-01-31 ENCOUNTER — Ambulatory Visit (HOSPITAL_COMMUNITY)
Admission: RE | Admit: 2022-01-31 | Discharge: 2022-01-31 | Disposition: A | Discharge status: Home / Self Care | Payer: Medicare (Managed Care) | Source: Ambulatory Visit | Attending: Physician Assistant | Admitting: Physician Assistant

## 2022-01-31 DIAGNOSIS — Z87891 Personal history of nicotine dependence: Secondary | ICD-10-CM | POA: Insufficient documentation

## 2022-02-08 NOTE — Progress Notes (Unsigned)
[place],  [id number]   CLINIC:  Medical Oncology/Hematology  PCP:  Pcp, No No address on file None   REASON FOR VISIT:  Follow-up for thrombocytosis and leukocytosis  PRIOR THERAPY: None  CURRENT THERAPY: Under work-up  INTERVAL HISTORY:  Todd [name] 66 y.o. male returns for routine follow-up of his thrombocytosis and leukocytosis.  He was seen for initial consultation by Tarri Abernethy PA-C and Dr. Delton Coombes on 01/12/2022.  He denies any changes in his health since his initial visit 4 weeks ago.  Denies any recent infections.  No B symptoms.  No DVT or PE.  No vasomotor symptoms or aquagenic pruritus.  He has 75% energy and 85% appetite. He endorses that he is maintaining a stable weight.   REVIEW OF SYSTEMS:  Review of Systems  Constitutional:  Negative for appetite change, chills, diaphoresis, fatigue, fever and unexpected weight change.  HENT:   Negative for lump/mass and nosebleeds.   Eyes:  Negative for eye problems.  Respiratory:  Negative for cough, hemoptysis and shortness of breath.   Cardiovascular:  Negative for chest pain, leg swelling and palpitations.  Gastrointestinal:  Negative for abdominal pain, blood in stool, constipation, diarrhea, nausea and vomiting.  Genitourinary:  Negative for hematuria.   Skin: Negative.   Neurological:  Negative for dizziness, headaches and light-headedness.  Hematological:  Does not bruise/bleed easily.      PAST MEDICAL/SURGICAL HISTORY:  Past Medical History:  Diagnosis Date   AKI (acute kidney injury) ([place])    Constipated    Diabetes mellitus without complication (HCC)    Diarrhea    Elevated LFTs    Goiter    Gout    Hyperlipidemia    Hypertension    Leukocytosis    Reactive thrombocytosis    Right BKA infection ([place]) 08/2016   Right leg pain    Sepsis due to undetermined organism [facility])    Thyroid disease    Wound infection after surgery 08/2016   Past  Surgical History:  Procedure Laterality Date   ABDOMINAL AORTOGRAM N/A 08/11/2016   Procedure: Abdominal Aortogram;  Surgeon: Waynetta Sandy, MD;  Location: [place] [facility];  Service: Cardiovascular;  Laterality: N/A;   ABDOMINAL AORTOGRAM W/LOWER EXTREMITY N/A 08/15/2016   Procedure: Abdominal Aortogram w/Lower Extremity;  Surgeon: Elam Dutch, MD;  Location: [place] [facility];  Service: Cardiovascular;  Laterality: N/A;   AMPUTATION Right 08/17/2016   Procedure: RIGHT BELOW KNEE AMPUTATION;  Surgeon: Elam Dutch, MD;  Location: [place];  Service: Vascular;  Laterality: Right;   AMPUTATION Right 09/12/2016   Procedure: AMPUTATION ABOVE KNEE;  Surgeon: Newt Minion, MD;  Location: [place];  Service: Orthopedics;  Laterality: Right;   AMPUTATION Left 08/12/2016   Procedure: LEFT BELOW KNEE AMPUTATION;  Surgeon: Newt Minion, MD;  Location: [place];  Service: Orthopedics;  Laterality: Left;   AMPUTATION Left 11/01/2017   Procedure: LEFT ABOVE KNEE AMPUTATION;  Surgeon: Newt Minion, MD;  Location: [place];  Service: Orthopedics;  Laterality: Left;   APPLICATION OF WOUND VAC Right 09/12/2016   Procedure: APPLICATION OF WOUND VAC ABOVE KNEE;  Surgeon: Newt Minion, MD;  Location: [place];  Service: Orthopedics;  Laterality: Right;   BIOPSY  01/13/2021   Procedure: BIOPSY;  Surgeon: Rogene Houston, MD;  [facility];  Service: Endoscopy;;  esophageal   COLONOSCOPY WITH PROPOFOL N/A 01/13/2021   Procedure: COLONOSCOPY WITH PROPOFOL;  Surgeon:  Rogene Houston, MD;  [facility];  Service: Endoscopy;  Laterality: N/A;   ESOPHAGOGASTRODUODENOSCOPY (EGD) WITH PROPOFOL N/A 01/13/2021   Procedure: ESOPHAGOGASTRODUODENOSCOPY (EGD) WITH PROPOFOL;  Surgeon: Rogene Houston, MD;  [facility];  Service: Endoscopy;  Laterality: N/A;   LOWER EXTREMITY ANGIOGRAPHY Bilateral 08/11/2016   Procedure: Lower Extremity Angiography;  Surgeon: Waynetta Sandy, MD;  Location:  [place] [facility];  Service: Cardiovascular;  Laterality: Bilateral;   PERIPHERAL VASCULAR BALLOON ANGIOPLASTY Left 08/11/2016   Procedure: Peripheral Vascular Balloon Angioplasty;  Surgeon: Waynetta Sandy, MD;  Location: [place] [facility];  Service: Cardiovascular;  Laterality: Left;  SFA   POLYPECTOMY  01/13/2021   Procedure: POLYPECTOMY;  Surgeon: Rogene Houston, MD;  [facility];  Service: Endoscopy;;   THYROID SURGERY       SOCIAL HISTORY:  Social History   Socioeconomic History   Marital status: Single    Spouse name: Not on file   Number of children: Not on file   Years of education: Not on file   Highest education level: Not on file  Occupational History   Occupation: retired    Comment: drove a Medical illustrator  Tobacco Use   Smoking status: Every Day    Packs/day: 0.25    Years: 45.00    Total pack years: 11.25    Types: Cigarettes    Last attempt to quit: 11/12/2014    Years since quitting: 7.2   Smokeless tobacco: Never  Vaping Use   Vaping Use: Never used  Substance and Sexual Activity   Alcohol use: Yes    Alcohol/week: 2.0 standard drinks of alcohol    Types: 1 Glasses of wine, 1 Cans of beer per week   Drug use: No   Sexual activity: Not Currently  Other Topics Concern   Not on file  Social History Narrative   09/23/20 - Lives alone, uses a wheelchair, double amputee, not married, no children - HHA comes in 3x per week to help him bathe and set up weekly meds.   Social Determinants of Health   Financial Resource Strain: Low Risk  (09/23/2020)   Overall Financial Resource Strain (CARDIA)    Difficulty of Paying Living Expenses: Not very hard  Food Insecurity: No Food Insecurity (09/23/2020)   Hunger Vital Sign    Worried About Running Out of Food in the Last Year: Never true    Ran Out of Food in the Last Year: Never true  Transportation Needs: No Transportation Needs (09/23/2020)   PRAPARE - Hydrologist  (Medical): No    Lack of Transportation (Non-Medical): No  Physical Activity: Inactive (09/23/2020)   Exercise Vital Sign    Days of Exercise per Week: 0 days    Minutes of Exercise per Session: 0 min  Stress: No Stress Concern Present (09/23/2020)   [place]    Feeling of Stress : Only a little  Social Connections: Socially Isolated (09/23/2020)   Social Connection and Isolation Panel [NHANES]    Frequency of Communication with Friends and Family: More than three times a week    Frequency of Social Gatherings with Friends and Family: More than three times a week    Attends Religious Services: Never    Marine scientist or Organizations: No    Attends Archivist Meetings: Never    Marital Status: Never married  Intimate Partner Violence: Not  At Risk (09/23/2020)   Humiliation, Afraid, Rape, and Kick questionnaire    Fear of Current or Ex-Partner: No    Emotionally Abused: No    Physically Abused: No    Sexually Abused: No    FAMILY HISTORY:  Family History  Problem Relation Age of Onset   Heart disease Mother    Pneumonia Father    Diabetes Maternal Aunt    Diabetes Maternal Uncle     CURRENT MEDICATIONS:  Outpatient Encounter Medications as of 02/09/2022  Medication Sig   acetaminophen (TYLENOL) 325 MG tablet Take 2 tablets (650 mg total) by mouth every 6 (six) hours as needed for mild pain (or Fever >/= 101).   allopurinol (ZYLOPRIM) 100 MG tablet Take 1 tablet (100 mg total) by mouth daily.   amiodarone (PACERONE) 200 MG tablet    amLODipine (NORVASC) 5 MG tablet Take 1 tablet by mouth daily.   apixaban (ELIQUIS) 5 MG TABS tablet Take 1 tablet (5 mg total) by mouth 2 (two) times daily.   Ascorbic Acid 500 MG CAPS Take 500 mg by mouth 2 (two) times daily.   atorvastatin (LIPITOR) 40 MG tablet TAKE 1 TABLET BY MOUTH ONCE DAILY AT 6 PM (Patient taking differently: Take 40 mg by mouth daily.)    calcium carbonate (TUMS - DOSED IN MG ELEMENTAL CALCIUM) 500 MG chewable tablet Chew 2 tablets (400 mg of elemental calcium total) by mouth 2 (two) times daily with a meal. (Patient taking differently: Chew 2 tablets by mouth daily. 2 tablets before meals)   cetirizine (ZYRTEC) 10 MG tablet Take 1 tablet (10 mg total) by mouth daily as needed for allergies. For allergy symptoms   cholecalciferol (VITAMIN D3) 25 MCG (1000 UNIT) tablet Take 1,000 Units by mouth daily.   folic acid (FOLVITE) 1 MG tablet Take 1 tablet (1 mg total) by mouth daily.   furosemide (LASIX) 20 MG tablet Take 20 mg by mouth 2 (two) times daily.   Glucagon, rDNA, (GLUCAGON EMERGENCY) 1 MG KIT SMARTSIG:1 Milligram(s) IM Once PRN   glucose blood (ACCU-CHEK AVIVA PLUS) test strip Check blood sugars four times a day   haloperidol (HALDOL) 5 MG tablet Take by mouth.   hydrALAZINE (APRESOLINE) 25 MG tablet Take 1 tablet (25 mg total) by mouth 3 (three) times daily.   insulin glargine (LANTUS) 100 UNIT/ML Solostar Pen Inject 16 Units into the skin at bedtime.   insulin lispro (HUMALOG) 100 UNIT/ML KwikPen Inject 3-9 Units into the skin 3 (three) times daily. Sliding scale 90-150-3 units, 151-200- 4units, 200-250 5units, 251-300 6units, 301-350 7units, 351-400 8units,401-450 9units   isosorbide mononitrate (IMDUR) 30 MG 24 hr tablet Take 1 tablet (30 mg total) by mouth daily. (Patient taking differently: Take 30 mg by mouth daily. 2 tablets by mouth once daily)   K Phos Mono-Sod Phos Di & Mono (PHOSPHA 250 NEUTRAL) 155-852-130 MG TABS Take 1 tablet by mouth 2 (two) times daily.   levofloxacin (LEVAQUIN) 750 MG tablet TAKE 1 TABLET (750 MG TOTAL) BY MOUTH IN THE MORNING FOR 4 DAYS.   levothyroxine (SYNTHROID) 75 MCG tablet Take 1 tablet (75 mcg total) by mouth daily before breakfast.   LORazepam (ATIVAN) 0.5 MG tablet Take 1 tablet (0.5 mg total) by mouth every 6 (six) hours as needed for anxiety.   megestrol (MEGACE) 400 MG/10ML  suspension Take 10 mLs (400 mg total) by mouth 2 (two) times daily. For appetite stimulation   metoprolol succinate (TOPROL-XL) 100 MG 24 hr tablet Take 1  tablet (100 mg total) by mouth daily. Take with or immediately following a meal.   mirtazapine (REMERON) 7.5 MG tablet Take 7.5 mg by mouth at bedtime.   Multiple Vitamin (MULTIVITAMIN WITH MINERALS) TABS tablet Take 1 tablet by mouth daily.   pantoprazole (PROTONIX) 40 MG tablet Take 1 tablet (40 mg total) by mouth 2 (two) times daily.   polyethylene glycol (MIRALAX / GLYCOLAX) 17 g packet Take 17 g by mouth daily.   potassium chloride SA (KLOR-CON M) 20 MEQ tablet Take 1 tablet (20 mEq total) by mouth daily.   PROTEIN PO Take 30 mLs by mouth daily. Liquid protein for low albumin   sertraline (ZOLOFT) 50 MG tablet Take 1 tablet (50 mg total) by mouth at bedtime.   sodium bicarbonate 650 MG tablet Take 650 mg by mouth 3 (three) times daily.   thiamine 100 MG tablet Take 1 tablet (100 mg total) by mouth daily.   triamcinolone 0.1%-silver sulfadiazine 1:1 cream mixture Apply topically as needed.   No facility-administered encounter medications on file as of 02/09/2022.    ALLERGIES:  Allergies  Allergen Reactions   Lisinopril Other (See Comments)    Hyperkalemia / Renal failure     PHYSICAL EXAM:  ECOG PERFORMANCE STATUS: 0 - Asymptomatic  There were no vitals filed for this visit. There were no vitals filed for this visit. Physical Exam Constitutional:      Appearance: Normal appearance.     Comments: Presents in wheelchair.  HENT:     Head: Normocephalic and atraumatic.     Mouth/Throat:     Mouth: Mucous membranes are moist.  Eyes:     Extraocular Movements: Extraocular movements intact.     Pupils: Pupils are equal, round, and reactive to light.  Cardiovascular:     Rate and Rhythm: Normal rate and regular rhythm.     Pulses: Normal pulses.     Heart sounds: Normal heart sounds.  Pulmonary:     Effort: Pulmonary effort  is normal.     Breath sounds: Normal breath sounds.  Abdominal:     General: Bowel sounds are normal.     Palpations: Abdomen is soft.     Tenderness: There is no abdominal tenderness.  Musculoskeletal:        General: No swelling.     Right lower leg: No edema.     Left lower leg: No edema.     Right Lower Extremity: Right leg is amputated above knee.     Left Lower Extremity: Left leg is amputated above knee.  Lymphadenopathy:     Cervical: No cervical adenopathy.  Skin:    General: Skin is warm and dry.  Neurological:     General: No focal deficit present.     Mental Status: He is alert and oriented to person, place, and time.  Psychiatric:        Mood and Affect: Mood normal.        Behavior: Behavior normal.      LABORATORY DATA:  I have reviewed the labs as listed.  CBC    Component Value Date/Time   WBC 9.2 01/12/2022 0918   RBC 4.55 01/12/2022 0918   HGB 12.3 (L) 01/12/2022 0918   HGB 9.1 (L) 05/18/2021 1110   HCT 38.5 (L) 01/12/2022 0918   HCT 27.5 (L) 05/18/2021 1110   PLT 541 (H) 01/12/2022 0918   PLT 706 (H) 05/18/2021 1110   MCV 84.6 01/12/2022 0918   MCV 84 05/18/2021 1110  MCH 27.0 01/12/2022 0918   MCHC 31.9 01/12/2022 0918   RDW 17.0 (H) 01/12/2022 0918   RDW 13.5 05/18/2021 1110   LYMPHSABS 2.4 01/12/2022 0918   LYMPHSABS 2.3 01/27/2020 0859   MONOABS 0.5 01/12/2022 0918   EOSABS 0.5 01/12/2022 0918   EOSABS 0.2 01/27/2020 0859   BASOSABS 0.0 01/12/2022 0918   BASOSABS 0.1 01/27/2020 0859      Latest Ref Rng & Units 01/12/2022    9:18 AM 08/30/2021    9:23 AM 05/28/2021    6:05 AM  CMP  Glucose 70 - 99 mg/dL 151   157   BUN 8 - 23 mg/dL 25   29   Creatinine 0.61 - 1.24 mg/dL 1.97   2.07   Sodium 135 - 145 mmol/L 146   142   Potassium 3.5 - 5.1 mmol/L 3.7   3.4   Chloride 98 - 111 mmol/L 116   110   CO2 22 - 32 mmol/L 22   20   Calcium 8.9 - 10.3 mg/dL 8.2   7.8   Total Protein 6.5 - 8.1 g/dL 7.8  8.1    Total Bilirubin 0.3 - 1.2  mg/dL 1.0  0.6    Alkaline Phos 38 - 126 U/L 228  118    AST 15 - 41 U/L 45  41    ALT 0 - 44 U/L 32  18      DIAGNOSTIC IMAGING:  I have independently reviewed the relevant imaging and discussed with the patient.  ASSESSMENT & PLAN: 1.  Thrombocytosis & neutrophilic leukocytosis -  Intermittent thrombocytosis since at least 2017, with maximum platelets 1009 on 08/30/2016. - Intermittent leukocytosis (primarily neutrophilic) since at least 3710, with highest WBC recorded at 30.6 on 09/11/2016.  (During March/April 2018 - patient was being treated for diabetic foot ulcers prior to his bilateral AKA.) - History of bilateral AKA secondary to diabetic foot ulcers/PAD (2018)   - Denies recent infections or steroid medications.  No autoimmune/connective tissue disorder.  No splenectomy.  No personal history of cancer, up-to-date on age-appropriate cancer screenings. - He does not take any aspirin, but is on Eliquis due to paroxysmal atrial fibrillation. - Current smoker (3 to 4 cigarettes daily since moving into SNF, previously smoked 0.5 PPD x45 years) - No history of DVT or PE. - Hematology work-up (01/12/2022): Negative JAK2 with reflex to CALR and MPL Negative BCR/ABL FISH Elevated ESR 45, elevated CRP 1.0.  Normal ANA and RF.  Normal LDH. Elevated ferritin with normal saturation and TIBC. - Most recent CBC (01/12/2022) with normal WBC 9.2 and moderately elevated platelets 541, normal differential - No aquagenic pruritus, B symptoms, or vasomotor symptoms. - PLAN: MPN work-up negative.  Due to chronicity of intermittent thrombocytosis and leukocytosis, differential diagnosis favors reactive process secondary to patient's extensive chronic comorbidities and chronic inflammation. - Repeat CBC with RTC in 4 months.   2.  Normocytic anemia - Labs from SNF (11/15/2021) show Hgb 10.8/MCV 81.3.  CMP (09/14/2021) showed creatinine 3.68/GFR 19. - Denies history of blood loss or iron deficiency, but received  PRBC x2 in August 2022 in the setting of severe anemia (Hgb 6.6) - Hemoccult positive in August 2022. - EGD/colonoscopy (01/13/2021): HSV esophagitis with ulceration, gastritis, duodenitis.  Petechiae in ascending colon and cecum, polyps x3 (tubular adenoma), diverticulosis. - Medications include folic acid, multivitamin, thiamine, vitamin D supplements; he takes Protonix and Eliquis as well. - Has previously followed with [facility] - Hematology work-up (01/12/2022): SPEP negative.  Immunofixation with polyclonal immunoglobulins.  Elevated free light chains with normal ratio in keeping with CKD. Normal B12 with mildly elevated MMA 508 (possibly false positive in the setting of advanced CKD).  Normal copper and folate.  Elevated ferritin 675 with normal TIBC and iron saturation 20%. Creatinine 1.97/GFR 37. - Most recent CBC (01/12/2022): Hgb 12.3/MCV 84.6 - PLAN: Differential diagnosis favors anemia of chronic disease/CKD stage IIIb/IV - No indication for ESA or IV iron at this time. - Recommend starting vitamin B12 500 mcg daily.   - Repeat CBC, CMP, B12/MMA, and iron panel with RTC in 4 months   3.  Tobacco abuse - This patient meets criteria for low-dose CT lung cancer screening (active smoker, 22.5 pack year history) - LDCT chest (01/31/2022): Lung RADS category 2, benign appearance.  Incidental findings of coronary atherosclerosis, aortic atherosclerosis, nephrolithiasis, and emphysema. - PLAN: Next LDCT chest due August 2024   4.  Other history - MAJOR COMORBIDITIES: Diastolic CHF, CKD stage IIIb/IV, insulin-dependent type 2 diabetes mellitus, paroxysmal A-fib (on Eliquis) - ADDITIONAL PMH: S/p bilateral AKA (2018) secondary to diabetic foot ulcer/PAD, hypertension, GERD, gastritis/duodenitis, HSV esophagitis - SOCIAL: Currently resides at [facility].  Wheelchair dependent.  Currently disabled due to bilateral AKA.  Previously worked in [employer]. - SUBSTANCE: History of daily  alcohol use, quit after moving into SNF in 2022.  History of daily cocaine use, quit in 2022 after moving into SNF.  Smoked 0.5 PPD cigarettes since age 1, cut back to about 3 cigarettes daily at age 44 after moving into SNF.    PLAN SUMMARY & DISPOSITION:   Labs in 4 months (CBC, CMP, B12/MMA, ferritin, iron/TIBC) RTC 1 week after labs  All questions were answered. The patient knows to call the clinic with any problems, questions or concerns.  Medical decision making: Moderate  Time spent on visit: I spent 20 minutes counseling the patient face to face. The total time spent in the appointment was 30 minutes and more than 50% was on counseling.   Harriett Rush, PA-C  02/09/2022 10:15 AM

## 2022-02-09 ENCOUNTER — Inpatient Hospital Stay (HOSPITAL_BASED_OUTPATIENT_CLINIC_OR_DEPARTMENT_OTHER): Payer: Medicare (Managed Care) | Admitting: Physician Assistant

## 2022-02-09 VITALS — BP 123/68 | HR 52 | Temp 98.4°F | Resp 18

## 2022-02-09 DIAGNOSIS — D649 Anemia, unspecified: Secondary | ICD-10-CM

## 2022-02-09 DIAGNOSIS — D72828 Other elevated white blood cell count: Secondary | ICD-10-CM

## 2022-02-09 DIAGNOSIS — D75839 Thrombocytosis, unspecified: Secondary | ICD-10-CM | POA: Diagnosis not present

## 2022-02-09 DIAGNOSIS — E538 Deficiency of other specified B group vitamins: Secondary | ICD-10-CM

## 2022-02-09 MED ORDER — CYANOCOBALAMIN 500 MCG PO TABS: 500.0000 ug | ORAL_TABLET | Freq: Every day | ORAL | 3 refills | Status: DC

## 2022-02-09 NOTE — Patient Instructions (Signed)
[facility] **   You were seen today by Tarri Abernethy PA-C for your anemia and elevated white blood cells and platelets.    ELEVATED WHITE BLOOD CELLS & PLATELETS The labs we checked did NOT show any signs of genetic mutation or blood cancer as the cause of your high white blood cells and platelets. These elevated levels are most likely "reactive," meaning that it is your body's response to underlying inflammation.  ANEMIA Your anemia is related to your chronic kidney disease.  LABS: Return in 4 months for repeat labs  MEDICATIONS: START taking vitamin B12 500 mcg daily  FOLLOW-UP APPOINTMENT: Office visit in 4 months, 1 week after labs  ** Thank you for trusting me with your healthcare!  I strive to provide all of my patients with quality care at each visit.  If you receive a survey for this visit, I would be so grateful to you for taking the time to provide feedback.  Thank you in advance!  ~ Lamarkus Nebel                   Dr. Derek Jack   &   Tarri Abernethy, PA-C   - - - - - - - - - - - - - - - - - -    Thank you for choosing [facility] to provide your oncology and hematology care.  To afford each patient quality time with our provider, please arrive at least 15 minutes before your scheduled appointment time.   If you have a lab appointment with the [facility] please come in thru the Main Entrance and check in at the main information desk.  You need to re-schedule your appointment should you arrive 10 or more minutes late.  We strive to give you quality time with our providers, and arriving late affects you and other patients whose appointments are after yours.  Also, if you no show three or more times for appointments you may be dismissed from the clinic at the providers discretion.     Again, thank you for choosing [facility].  Our hope  is that these requests will decrease the amount of time that you wait before being seen by our physicians.       _____________________________________________________________  Should you have questions after your visit to [facility], please contact our office at [phone] and follow the prompts.  Our office hours are 8:00 a.m. and 4:30 p.m. Monday - Friday.  Please note that voicemails left after 4:00 p.m. may not be returned until the following business day.  We are closed weekends and major holidays.  You do have access to a nurse 24-7, just call the main number to the clinic [phone] and do not press any options, hold on the line and a nurse will answer the phone.    For prescription refill requests, have your pharmacy contact our office and allow 72 hours.

## 2022-02-10 ENCOUNTER — Telehealth: Payer: Self-pay | Admitting: *Deleted

## 2022-02-10 NOTE — Telephone Encounter (Signed)
Sister [name] called to request copy of patient's most recent visit labs.  Advised that I could not release that information without his consent.  Called Mr. Haque to confirm that labs could be shared with her, as she is listed as next of kin.  Will e mail to [email or web address].

## 2022-03-29 ENCOUNTER — Emergency Department (HOSPITAL_COMMUNITY): Payer: Medicare (Managed Care)

## 2022-03-29 ENCOUNTER — Emergency Department (HOSPITAL_COMMUNITY)
Admission: EM | Admit: 2022-03-29 | Discharge: 2022-03-30 | Disposition: A | Discharge status: Home / Self Care | Payer: Medicare (Managed Care) | Attending: Emergency Medicine | Admitting: Emergency Medicine

## 2022-03-29 ENCOUNTER — Encounter (HOSPITAL_COMMUNITY): Payer: Self-pay | Admitting: Emergency Medicine

## 2022-03-29 ENCOUNTER — Other Ambulatory Visit: Payer: Self-pay

## 2022-03-29 DIAGNOSIS — D72829 Elevated white blood cell count, unspecified: Secondary | ICD-10-CM | POA: Diagnosis not present

## 2022-03-29 DIAGNOSIS — I4891 Unspecified atrial fibrillation: Secondary | ICD-10-CM | POA: Insufficient documentation

## 2022-03-29 DIAGNOSIS — I509 Heart failure, unspecified: Secondary | ICD-10-CM | POA: Diagnosis not present

## 2022-03-29 DIAGNOSIS — N189 Chronic kidney disease, unspecified: Secondary | ICD-10-CM | POA: Diagnosis not present

## 2022-03-29 DIAGNOSIS — Z794 Long term (current) use of insulin: Secondary | ICD-10-CM | POA: Insufficient documentation

## 2022-03-29 DIAGNOSIS — E039 Hypothyroidism, unspecified: Secondary | ICD-10-CM | POA: Insufficient documentation

## 2022-03-29 DIAGNOSIS — M545 Low back pain, unspecified: Secondary | ICD-10-CM

## 2022-03-29 DIAGNOSIS — E1122 Type 2 diabetes mellitus with diabetic chronic kidney disease: Secondary | ICD-10-CM | POA: Insufficient documentation

## 2022-03-29 DIAGNOSIS — Z7901 Long term (current) use of anticoagulants: Secondary | ICD-10-CM | POA: Insufficient documentation

## 2022-03-29 DIAGNOSIS — M546 Pain in thoracic spine: Secondary | ICD-10-CM | POA: Diagnosis not present

## 2022-03-29 DIAGNOSIS — I130 Hypertensive heart and chronic kidney disease with heart failure and stage 1 through stage 4 chronic kidney disease, or unspecified chronic kidney disease: Secondary | ICD-10-CM | POA: Diagnosis not present

## 2022-03-29 LAB — COMPREHENSIVE METABOLIC PANEL
ALT: 15 U/L (ref 0–44)
AST: 27 U/L (ref 15–41)
Albumin: 2.7 g/dL — ABNORMAL LOW (ref 3.5–5.0)
Alkaline Phosphatase: 172 U/L — ABNORMAL HIGH (ref 38–126)
Anion gap: 9 (ref 5–15)
BUN: 21 mg/dL (ref 8–23)
CO2: 23 mmol/L (ref 22–32)
Calcium: 6.9 mg/dL — ABNORMAL LOW (ref 8.9–10.3)
Chloride: 113 mmol/L — ABNORMAL HIGH (ref 98–111)
Creatinine, Ser: 1.93 mg/dL — ABNORMAL HIGH (ref 0.61–1.24)
GFR, Estimated: 38 mL/min — ABNORMAL LOW (ref >60)
Glucose, Bld: 80 mg/dL (ref 70–99)
Potassium: 3.5 mmol/L (ref 3.5–5.1)
Sodium: 145 mmol/L (ref 135–145)
Total Bilirubin: 0.9 mg/dL (ref 0.3–1.2)
Total Protein: 7.8 g/dL (ref 6.5–8.1)

## 2022-03-29 LAB — CBC WITH DIFFERENTIAL/PLATELET
Abs Immature Granulocytes: 0.06 10*3/uL (ref 0.00–0.07)
Basophils Absolute: 0.1 10*3/uL (ref 0.0–0.1)
Basophils Relative: 1 %
Eosinophils Absolute: 0.7 10*3/uL — ABNORMAL HIGH (ref 0.0–0.5)
Eosinophils Relative: 6 %
HCT: 36.7 % — ABNORMAL LOW (ref 39.0–52.0)
Hemoglobin: 12.1 g/dL — ABNORMAL LOW (ref 13.0–17.0)
Immature Granulocytes: 1 %
Lymphocytes Relative: 22 %
Lymphs Abs: 2.5 10*3/uL (ref 0.7–4.0)
MCH: 27.4 pg (ref 26.0–34.0)
MCHC: 33 g/dL (ref 30.0–36.0)
MCV: 83 fL (ref 80.0–100.0)
Monocytes Absolute: 0.5 10*3/uL (ref 0.1–1.0)
Monocytes Relative: 5 %
Neutro Abs: 7.5 10*3/uL (ref 1.7–7.7)
Neutrophils Relative %: 65 %
Platelets: 527 10*3/uL — ABNORMAL HIGH (ref 150–400)
RBC: 4.42 MIL/uL (ref 4.22–5.81)
RDW: 17.8 % — ABNORMAL HIGH (ref 11.5–15.5)
WBC: 11.3 10*3/uL — ABNORMAL HIGH (ref 4.0–10.5)
nRBC: 0 % (ref 0.0–0.2)

## 2022-03-29 LAB — URINALYSIS, ROUTINE W REFLEX MICROSCOPIC
Bacteria, UA: NONE SEEN
Bilirubin Urine: NEGATIVE
Glucose, UA: NEGATIVE mg/dL
Hgb urine dipstick: NEGATIVE
Ketones, ur: NEGATIVE mg/dL
Nitrite: NEGATIVE
Protein, ur: >=300 mg/dL — AB
Specific Gravity, Urine: 1.019 (ref 1.005–1.030)
pH: 6 (ref 5.0–8.0)

## 2022-03-29 LAB — MAGNESIUM: Magnesium: 1.6 mg/dL — ABNORMAL LOW (ref 1.7–2.4)

## 2022-03-29 LAB — C-REACTIVE PROTEIN: CRP: 0.7 mg/dL (ref <1.0)

## 2022-03-29 LAB — SEDIMENTATION RATE: Sed Rate: 61 mm/hr — ABNORMAL HIGH (ref 0–16)

## 2022-03-29 MED ORDER — LIDOCAINE 5 % EX PTCH: 1.0000 | MEDICATED_PATCH | CUTANEOUS | 0 refills | Status: AC

## 2022-03-29 MED ORDER — MAGNESIUM SULFATE 2 GM/50ML IV SOLN
2.0000 g | Freq: Once | INTRAVENOUS | Status: AC
  Administered 2022-03-29: 2 g via INTRAVENOUS
  Filled 2022-03-29: qty 50

## 2022-03-29 MED ORDER — OXYCODONE-ACETAMINOPHEN 5-325 MG PO TABS
1.0000 | ORAL_TABLET | Freq: Once | ORAL | Status: AC
  Administered 2022-03-29: 1 via ORAL
  Filled 2022-03-29: qty 1

## 2022-03-29 MED ORDER — LIDOCAINE 5 % EX PTCH
1.0000 | MEDICATED_PATCH | CUTANEOUS | Status: DC
  Administered 2022-03-29: 1 via TRANSDERMAL
  Filled 2022-03-29: qty 1

## 2022-03-29 MED ORDER — PREDNISONE 10 MG (21) PO TBPK: ORAL_TABLET | Freq: Every day | ORAL | 0 refills | Status: DC

## 2022-03-29 MED ORDER — KETOROLAC TROMETHAMINE 15 MG/ML IJ SOLN
15.0000 mg | Freq: Once | INTRAMUSCULAR | Status: AC
  Administered 2022-03-29: 15 mg via INTRAVENOUS
  Filled 2022-03-29: qty 1

## 2022-03-29 MED ORDER — MAGNESIUM OXIDE -MG SUPPLEMENT 400 (240 MG) MG PO TABS: 800.0000 mg | ORAL_TABLET | Freq: Once | ORAL | Status: DC

## 2022-03-29 MED ORDER — CALCIUM GLUCONATE-NACL 1-0.675 GM/50ML-% IV SOLN
1.0000 g | Freq: Once | INTRAVENOUS | Status: AC
  Administered 2022-03-29: 1000 mg via INTRAVENOUS
  Filled 2022-03-29: qty 50

## 2022-03-29 MED ORDER — METHOCARBAMOL 500 MG PO TABS
500.0000 mg | ORAL_TABLET | Freq: Once | ORAL | Status: AC
  Administered 2022-03-29: 500 mg via ORAL
  Filled 2022-03-29: qty 1

## 2022-03-29 MED ORDER — OXYCODONE-ACETAMINOPHEN 5-325 MG PO TABS: 1.0000 | ORAL_TABLET | Freq: Four times a day (QID) | ORAL | 0 refills | Status: AC | PRN

## 2022-03-29 NOTE — Discharge Instructions (Addendum)
Continue taking your Robaxin as needed.  Additional prescriptions were sent to your pharmacy: -Lidocaine patches can be applied to your area of pain daily.  Avoid having more than 1 patch on your skin at any given time. -Prednisone can reduce inflammation and improve pain.  Take this medication as prescribed in tapering dose.  Be aware that use of this medication will increase your blood glucose and you should adjust your insulin as needed. -Percocet is a narcotic pain medication.  Take this only as needed.  Please follow-up with your primary care doctor soon as possible.  If you develop worsening symptoms or any new symptoms of concern, please return to the emergency department.

## 2022-03-29 NOTE — ED Notes (Signed)
Pt given urinal and told need of urine specimen. Stated he does not need to go at this time.

## 2022-03-29 NOTE — ED Provider Notes (Signed)
[facility] EMERGENCY DEPARTMENT Provider Note   CSN: [id number] Arrival date & time: 03/29/22  1537     History {Add pertinent medical, surgical, social history, OB history to HPI:1} Chief Complaint  Patient presents with   Back Pain    Todd [name] is a 66 y.o. male.   Back Pain Patient presents for back pain.  Medical history includes HTN, hypothyroidism, T2DM, HLD, atrial fibrillation, polysubstance abuse, GERD, CHF, CKD.  He states that his lower back has been hurting for the past 1 to 2 days.  He denies any recent injuries or straining activities.  Patient currently resides in [place].  He has history of bilateral AKA's.  He requires assistance with transfers.  Per review of facility paperwork, it appears that he receives Robaxin only for pain medication.  Patient denies any recent urinary symptoms.  He denies any prior history of back pain.  He denies any other areas of recent discomfort.     Home Medications Prior to Admission medications   Medication Sig Start Date End Date Taking? Authorizing Provider  acetaminophen (TYLENOL) 325 MG tablet Take 2 tablets (650 mg total) by mouth every 6 (six) hours as needed for mild pain (or Fever >/= 101). 01/15/21   Johnson, Clanford L, MD  allopurinol (ZYLOPRIM) 100 MG tablet Take 1 tablet (100 mg total) by mouth daily. 12/06/20   Murlean Iba, MD  amiodarone (PACERONE) 200 MG tablet  08/24/21   [provider]  amLODipine (NORVASC) 5 MG tablet Take 1 tablet by mouth daily. 04/03/21   [provider]  apixaban (ELIQUIS) 5 MG TABS tablet Take 1 tablet (5 mg total) by mouth 2 (two) times daily. 01/16/21   Johnson, Clanford L, MD  Ascorbic Acid 500 MG CAPS Take 500 mg by mouth 2 (two) times daily.    [provider]  atorvastatin (LIPITOR) 40 MG tablet TAKE 1 TABLET BY MOUTH ONCE DAILY AT 6 PM Patient taking differently: Take 40 mg by mouth daily. 12/06/20   Johnson, Clanford L, MD  calcium  carbonate (TUMS - DOSED IN MG ELEMENTAL CALCIUM) 500 MG chewable tablet Chew 2 tablets (400 mg of elemental calcium total) by mouth 2 (two) times daily with a meal. Patient taking differently: Chew 2 tablets by mouth daily. 2 tablets before meals 01/15/21   Johnson, Clanford L, MD  cetirizine (ZYRTEC) 10 MG tablet Take 1 tablet (10 mg total) by mouth daily as needed for allergies. For allergy symptoms 12/06/20   Murlean Iba, MD  cholecalciferol (VITAMIN D3) 25 MCG (1000 UNIT) tablet Take 1,000 Units by mouth daily.    [provider]  cyanocobalamin (V-R VITAMIN B-12) 500 MCG tablet Take 1 tablet (500 mcg total) by mouth daily. 02/09/22   Harriett Rush, PA-C  folic acid (FOLVITE) 1 MG tablet Take 1 tablet (1 mg total) by mouth daily. 12/29/20   Roxan Hockey, MD  furosemide (LASIX) 20 MG tablet Take 20 mg by mouth 2 (two) times daily. 08/30/21   [provider]  Glucagon, rDNA, (GLUCAGON EMERGENCY) 1 MG KIT SMARTSIG:1 Milligram(s) IM Once PRN 10/30/21   [provider]  glucose blood (ACCU-CHEK AVIVA PLUS) test strip Check blood sugars four times a day 03/12/18   [name], MD  haloperidol (HALDOL) 5 MG tablet Take by mouth. 11/04/21   [provider]  hydrALAZINE (APRESOLINE) 25 MG tablet Take 1 tablet (25 mg total) by mouth 3 (three) times daily. 05/11/21   Patrecia Pour,  MD  insulin glargine (LANTUS) 100 UNIT/ML Solostar Pen Inject 16 Units into the skin at bedtime. 01/11/22   Brita Romp, NP  insulin lispro (HUMALOG) 100 UNIT/ML KwikPen Inject 3-9 Units into the skin 3 (three) times daily. Sliding scale 90-150-3 units, 151-200- 4units, 200-250 5units, 251-300 6units, 301-350 7units, 351-400 8units,401-450 9units 01/11/22   Brita Romp, NP  isosorbide mononitrate (IMDUR) 30 MG 24 hr tablet Take 1 tablet (30 mg total) by mouth daily. Patient taking differently: Take 30 mg by mouth daily. 2 tablets by mouth once daily 05/28/21   Eulogio Bear U, DO  K Phos Mono-Sod Phos Di & Mono (PHOSPHA 250 NEUTRAL) [phone] MG TABS Take 1 tablet by mouth 2 (two) times daily. 12/31/20   [provider]  levofloxacin (LEVAQUIN) 750 MG tablet TAKE 1 TABLET (750 MG TOTAL) BY MOUTH IN THE MORNING FOR 4 DAYS. 12/25/20   [provider]  levothyroxine (SYNTHROID) 75 MCG tablet Take 1 tablet (75 mcg total) by mouth daily before breakfast. 01/11/22   Brita Romp, NP  LORazepam (ATIVAN) 0.5 MG tablet Take 1 tablet (0.5 mg total) by mouth every 6 (six) hours as needed for anxiety. 05/28/21   Geradine Girt, DO  megestrol (MEGACE) 400 MG/10ML suspension Take 10 mLs (400 mg total) by mouth 2 (two) times daily. For appetite stimulation 12/06/20   Johnson, Clanford L, MD  metoprolol succinate (TOPROL-XL) 100 MG 24 hr tablet Take 1 tablet (100 mg total) by mouth daily. Take with or immediately following a meal. 05/12/21   Patrecia Pour, MD  mirtazapine (REMERON) 7.5 MG tablet Take 7.5 mg by mouth at bedtime. 12/23/21   [provider]  Multiple Vitamin (MULTIVITAMIN WITH MINERALS) TABS tablet Take 1 tablet by mouth daily. 01/16/21   Johnson, Clanford L, MD  pantoprazole (PROTONIX) 40 MG tablet Take 1 tablet (40 mg total) by mouth 2 (two) times daily. 01/15/21   Johnson, Clanford L, MD  polyethylene glycol (MIRALAX / GLYCOLAX) 17 g packet Take 17 g by mouth daily. 01/16/21   Johnson, Clanford L, MD  potassium chloride SA (KLOR-CON M) 20 MEQ tablet Take 1 tablet (20 mEq total) by mouth daily. 05/29/21   Eulogio Bear U, DO  PROTEIN PO Take 30 mLs by mouth daily. Liquid protein for low albumin    [provider]  sertraline (ZOLOFT) 50 MG tablet Take 1 tablet (50 mg total) by mouth at bedtime. 12/06/20   Johnson, Clanford L, MD  sodium bicarbonate 650 MG tablet Take 650 mg by mouth 3 (three) times daily.    [provider]  thiamine 100 MG tablet Take 1 tablet (100 mg total) by mouth daily. 12/29/20   Roxan Hockey, MD   triamcinolone 0.1%-silver sulfadiazine 1:1 cream mixture Apply topically as needed.    [provider]      Allergies    Lisinopril    Review of Systems   Review of Systems  Musculoskeletal:  Positive for back pain.  All other systems reviewed and are negative.   Physical Exam Updated Vital Signs BP (!) 161/75 (BP Location: Left Arm)   Pulse (!) 56   Temp 98.1 F (36.7 C) (Oral)   Resp 15   Wt 44 kg   SpO2 100%   BMI 14.75 kg/m  Physical Exam Vitals and nursing note reviewed.  Constitutional:      General: He is not in acute distress.    Appearance: Normal appearance. He is well-developed. He is  not ill-appearing, toxic-appearing or diaphoretic.  HENT:     Head: Normocephalic and atraumatic.     Right Ear: External ear normal.     Left Ear: External ear normal.     Nose: Nose normal.     Mouth/Throat:     Mouth: Mucous membranes are moist.     Pharynx: Oropharynx is clear.  Eyes:     Extraocular Movements: Extraocular movements intact.     Conjunctiva/sclera: Conjunctivae normal.  Cardiovascular:     Rate and Rhythm: Normal rate and regular rhythm.     Heart sounds: No murmur heard. Pulmonary:     Effort: Pulmonary effort is normal. No respiratory distress.  Abdominal:     General: There is no distension.     Palpations: Abdomen is soft.     Tenderness: There is no abdominal tenderness.  Musculoskeletal:        General: Tenderness present. No swelling.     Cervical back: Normal range of motion and neck supple.     Comments: Bilateral AKA's  Skin:    General: Skin is warm and dry.     Capillary Refill: Capillary refill takes less than 2 seconds.  Neurological:     General: No focal deficit present.     Mental Status: He is alert and oriented to person, place, and time.     Cranial Nerves: No cranial nerve deficit.     Sensory: No sensory deficit.     Motor: No weakness.     Coordination: Coordination normal.  Psychiatric:        Mood and  Affect: Mood normal.        Behavior: Behavior normal.        Thought Content: Thought content normal.        Judgment: Judgment normal.     ED Results / Procedures / Treatments   Labs (all labs ordered are listed, but only abnormal results are displayed) Labs Reviewed - No data to display  EKG None  Radiology No results found.  Procedures Procedures  {Document cardiac monitor, telemetry assessment procedure when appropriate:1}  Medications Ordered in ED Medications - No data to display  ED Course/ Medical Decision Making/ A&P                           Medical Decision Making  This patient presents to the ED for concern of ***, this involves an extensive number of treatment options, and is a complaint that carries with it a high risk of complications and morbidity.  The differential diagnosis includes ***   Co morbidities that complicate the patient evaluation  ***   Additional history obtained:  Additional history obtained from *** External records from outside source obtained and reviewed including ***   Lab Tests:  I Ordered, and personally interpreted labs.  The pertinent results include:  ***   Imaging Studies ordered:  I ordered imaging studies including ***  I independently visualized and interpreted imaging which showed *** I agree with the radiologist interpretation   Cardiac Monitoring: / EKG:  The patient was maintained on a cardiac monitor.  I personally viewed and interpreted the cardiac monitored which showed an underlying rhythm of: ***   Consultations Obtained:  I requested consultation with the ***,  and discussed lab and imaging findings as well as pertinent plan - they recommend: ***   Problem List / ED Course / Critical interventions / Medication management  Patient presents for  low back pain.  He reports that this has been present for the past 1 to 2 days.  He did not have any precipitating injuries or straining.  He currently  resides in a nursing facility due to mobility issues.  He has bilateral AKA's that he attributes to his diabetes.  On exam, he does have good mobility.  However, he states that he is typically assisted with transfers.  Area of pain is the S1 region.  He does not have any overlying skin changes.  He does have some mild tenderness that is present.  He also has bilateral paraspinous tenderness.  Although he denies any recent urine symptoms, he does have left-sided CVA tenderness.  Multimodal pain control was ordered.  Lab work, urine studies, and L-spine imaging to be obtained. *** I ordered medication including ***  for ***  Reevaluation of the patient after these medicines showed that the patient {resolved/improved/worsened:23923::"improved"} I have reviewed the patients home medicines and have made adjustments as needed   Social Determinants of Health:  ***   Test / Admission - Considered:  ***   {Document critical care time when appropriate:1} {Document review of labs and clinical decision tools ie heart score, Chads2Vasc2 etc:1}  {Document your independent review of radiology images, and any outside records:1} {Document your discussion with family members, caretakers, and with consultants:1} {Document social determinants of health affecting pt's care:1} {Document your decision making why or why not admission, treatments were needed:1} Final Clinical Impression(s) / ED Diagnoses Final diagnoses:  None    Rx / DC Orders ED Discharge Orders     None

## 2022-03-29 NOTE — ED Notes (Signed)
[place] notified of patient needing transportation back to [place] 

## 2022-03-29 NOTE — ED Triage Notes (Signed)
Pt reports lower back has been hurting since last night. Pt denies any falls or injury to the area.

## 2022-05-03 ENCOUNTER — Emergency Department (HOSPITAL_COMMUNITY): Payer: Medicare (Managed Care)

## 2022-05-03 ENCOUNTER — Encounter (HOSPITAL_COMMUNITY): Payer: Self-pay

## 2022-05-03 ENCOUNTER — Other Ambulatory Visit: Payer: Self-pay

## 2022-05-03 ENCOUNTER — Inpatient Hospital Stay (HOSPITAL_COMMUNITY)
Admission: EM | Admit: 2022-05-03 | Discharge: 2022-05-11 | DRG: 322 | Disposition: A | Discharge status: Skilled Nursing Facility | Payer: Medicare (Managed Care) | Source: Skilled Nursing Facility | Attending: Cardiovascular Disease | Admitting: Cardiovascular Disease

## 2022-05-03 DIAGNOSIS — G8929 Other chronic pain: Secondary | ICD-10-CM | POA: Diagnosis present

## 2022-05-03 DIAGNOSIS — Z79899 Other long term (current) drug therapy: Secondary | ICD-10-CM

## 2022-05-03 DIAGNOSIS — Z833 Family history of diabetes mellitus: Secondary | ICD-10-CM

## 2022-05-03 DIAGNOSIS — Z79890 Hormone replacement therapy (postmenopausal): Secondary | ICD-10-CM

## 2022-05-03 DIAGNOSIS — I5022 Chronic systolic (congestive) heart failure: Secondary | ICD-10-CM | POA: Diagnosis present

## 2022-05-03 DIAGNOSIS — Z8249 Family history of ischemic heart disease and other diseases of the circulatory system: Secondary | ICD-10-CM | POA: Diagnosis not present

## 2022-05-03 DIAGNOSIS — I5021 Acute systolic (congestive) heart failure: Secondary | ICD-10-CM | POA: Diagnosis present

## 2022-05-03 DIAGNOSIS — I472 Ventricular tachycardia, unspecified: Principal | ICD-10-CM | POA: Diagnosis present

## 2022-05-03 DIAGNOSIS — I442 Atrioventricular block, complete: Secondary | ICD-10-CM | POA: Diagnosis not present

## 2022-05-03 DIAGNOSIS — E1122 Type 2 diabetes mellitus with diabetic chronic kidney disease: Secondary | ICD-10-CM | POA: Diagnosis present

## 2022-05-03 DIAGNOSIS — I4819 Other persistent atrial fibrillation: Secondary | ICD-10-CM | POA: Diagnosis present

## 2022-05-03 DIAGNOSIS — Z955 Presence of coronary angioplasty implant and graft: Secondary | ICD-10-CM | POA: Diagnosis not present

## 2022-05-03 DIAGNOSIS — E1151 Type 2 diabetes mellitus with diabetic peripheral angiopathy without gangrene: Secondary | ICD-10-CM | POA: Diagnosis present

## 2022-05-03 DIAGNOSIS — F17210 Nicotine dependence, cigarettes, uncomplicated: Secondary | ICD-10-CM | POA: Diagnosis present

## 2022-05-03 DIAGNOSIS — D696 Thrombocytopenia, unspecified: Secondary | ICD-10-CM | POA: Diagnosis present

## 2022-05-03 DIAGNOSIS — I708 Atherosclerosis of other arteries: Secondary | ICD-10-CM | POA: Diagnosis present

## 2022-05-03 DIAGNOSIS — N179 Acute kidney failure, unspecified: Secondary | ICD-10-CM | POA: Diagnosis present

## 2022-05-03 DIAGNOSIS — I130 Hypertensive heart and chronic kidney disease with heart failure and stage 1 through stage 4 chronic kidney disease, or unspecified chronic kidney disease: Secondary | ICD-10-CM | POA: Diagnosis present

## 2022-05-03 DIAGNOSIS — E118 Type 2 diabetes mellitus with unspecified complications: Secondary | ICD-10-CM | POA: Diagnosis not present

## 2022-05-03 DIAGNOSIS — Z89612 Acquired absence of left leg above knee: Secondary | ICD-10-CM | POA: Diagnosis not present

## 2022-05-03 DIAGNOSIS — Z794 Long term (current) use of insulin: Secondary | ICD-10-CM | POA: Diagnosis not present

## 2022-05-03 DIAGNOSIS — Z993 Dependence on wheelchair: Secondary | ICD-10-CM

## 2022-05-03 DIAGNOSIS — I5042 Chronic combined systolic (congestive) and diastolic (congestive) heart failure: Secondary | ICD-10-CM | POA: Diagnosis not present

## 2022-05-03 DIAGNOSIS — Z89611 Acquired absence of right leg above knee: Secondary | ICD-10-CM

## 2022-05-03 DIAGNOSIS — Z91199 Patient's noncompliance with other medical treatment and regimen due to unspecified reason: Secondary | ICD-10-CM

## 2022-05-03 DIAGNOSIS — I4729 Other ventricular tachycardia: Secondary | ICD-10-CM

## 2022-05-03 DIAGNOSIS — I429 Cardiomyopathy, unspecified: Secondary | ICD-10-CM | POA: Diagnosis not present

## 2022-05-03 DIAGNOSIS — I509 Heart failure, unspecified: Secondary | ICD-10-CM

## 2022-05-03 DIAGNOSIS — I2510 Atherosclerotic heart disease of native coronary artery without angina pectoris: Secondary | ICD-10-CM | POA: Diagnosis present

## 2022-05-03 DIAGNOSIS — F32A Depression, unspecified: Secondary | ICD-10-CM | POA: Diagnosis present

## 2022-05-03 DIAGNOSIS — R0989 Other specified symptoms and signs involving the circulatory and respiratory systems: Secondary | ICD-10-CM | POA: Diagnosis not present

## 2022-05-03 DIAGNOSIS — I480 Paroxysmal atrial fibrillation: Secondary | ICD-10-CM

## 2022-05-03 DIAGNOSIS — F419 Anxiety disorder, unspecified: Secondary | ICD-10-CM | POA: Diagnosis present

## 2022-05-03 DIAGNOSIS — E876 Hypokalemia: Secondary | ICD-10-CM | POA: Diagnosis present

## 2022-05-03 DIAGNOSIS — I482 Chronic atrial fibrillation, unspecified: Secondary | ICD-10-CM | POA: Diagnosis not present

## 2022-05-03 DIAGNOSIS — Z7902 Long term (current) use of antithrombotics/antiplatelets: Secondary | ICD-10-CM

## 2022-05-03 DIAGNOSIS — E039 Hypothyroidism, unspecified: Secondary | ICD-10-CM | POA: Diagnosis present

## 2022-05-03 DIAGNOSIS — I493 Ventricular premature depolarization: Secondary | ICD-10-CM | POA: Diagnosis present

## 2022-05-03 DIAGNOSIS — R0602 Shortness of breath: Secondary | ICD-10-CM | POA: Diagnosis not present

## 2022-05-03 DIAGNOSIS — I4510 Unspecified right bundle-branch block: Secondary | ICD-10-CM | POA: Diagnosis present

## 2022-05-03 DIAGNOSIS — N1832 Chronic kidney disease, stage 3b: Secondary | ICD-10-CM | POA: Diagnosis present

## 2022-05-03 DIAGNOSIS — E785 Hyperlipidemia, unspecified: Secondary | ICD-10-CM | POA: Diagnosis present

## 2022-05-03 DIAGNOSIS — R7989 Other specified abnormal findings of blood chemistry: Secondary | ICD-10-CM | POA: Diagnosis not present

## 2022-05-03 DIAGNOSIS — Z7901 Long term (current) use of anticoagulants: Secondary | ICD-10-CM

## 2022-05-03 DIAGNOSIS — D8685 Sarcoid myocarditis: Secondary | ICD-10-CM | POA: Diagnosis present

## 2022-05-03 HISTORY — DX: Alcohol abuse, uncomplicated: F10.10

## 2022-05-03 LAB — CBC WITH DIFFERENTIAL/PLATELET
Abs Immature Granulocytes: 0.03 10*3/uL (ref 0.00–0.07)
Basophils Absolute: 0.1 10*3/uL (ref 0.0–0.1)
Basophils Relative: 1 %
Eosinophils Absolute: 0.3 10*3/uL (ref 0.0–0.5)
Eosinophils Relative: 4 %
HCT: 39.5 % (ref 39.0–52.0)
Hemoglobin: 12.9 g/dL — ABNORMAL LOW (ref 13.0–17.0)
Immature Granulocytes: 0 %
Lymphocytes Relative: 25 %
Lymphs Abs: 2.4 10*3/uL (ref 0.7–4.0)
MCH: 27.4 pg (ref 26.0–34.0)
MCHC: 32.7 g/dL (ref 30.0–36.0)
MCV: 83.9 fL (ref 80.0–100.0)
Monocytes Absolute: 0.5 10*3/uL (ref 0.1–1.0)
Monocytes Relative: 5 %
Neutro Abs: 6.2 10*3/uL (ref 1.7–7.7)
Neutrophils Relative %: 65 %
Platelets: 600 10*3/uL — ABNORMAL HIGH (ref 150–400)
RBC: 4.71 MIL/uL (ref 4.22–5.81)
RDW: 16 % — ABNORMAL HIGH (ref 11.5–15.5)
WBC: 9.5 10*3/uL (ref 4.0–10.5)
nRBC: 0 % (ref 0.0–0.2)

## 2022-05-03 LAB — TROPONIN I (HIGH SENSITIVITY)
Troponin I (High Sensitivity): 30 ng/L — ABNORMAL HIGH (ref <18)
Troponin I (High Sensitivity): 73 ng/L — ABNORMAL HIGH (ref <18)

## 2022-05-03 LAB — MAGNESIUM: Magnesium: 1.3 mg/dL — ABNORMAL LOW (ref 1.7–2.4)

## 2022-05-03 LAB — BASIC METABOLIC PANEL
Anion gap: 12 (ref 5–15)
BUN: 24 mg/dL — ABNORMAL HIGH (ref 8–23)
CO2: 21 mmol/L — ABNORMAL LOW (ref 22–32)
Calcium: 7 mg/dL — ABNORMAL LOW (ref 8.9–10.3)
Chloride: 110 mmol/L (ref 98–111)
Creatinine, Ser: 2.22 mg/dL — ABNORMAL HIGH (ref 0.61–1.24)
GFR, Estimated: 32 mL/min — ABNORMAL LOW (ref >60)
Glucose, Bld: 99 mg/dL (ref 70–99)
Potassium: 3.5 mmol/L (ref 3.5–5.1)
Sodium: 143 mmol/L (ref 135–145)

## 2022-05-03 LAB — I-STAT CHEM 8, ED
BUN: 21 mg/dL (ref 8–23)
Calcium, Ion: 0.88 mmol/L — CL (ref 1.15–1.40)
Chloride: 109 mmol/L (ref 98–111)
Creatinine, Ser: 2 mg/dL — ABNORMAL HIGH (ref 0.61–1.24)
Glucose, Bld: 98 mg/dL (ref 70–99)
HCT: 43 % (ref 39.0–52.0)
Hemoglobin: 14.6 g/dL (ref 13.0–17.0)
Potassium: 3.7 mmol/L (ref 3.5–5.1)
Sodium: 146 mmol/L — ABNORMAL HIGH (ref 135–145)
TCO2: 20 mmol/L — ABNORMAL LOW (ref 22–32)

## 2022-05-03 LAB — GLUCOSE, CAPILLARY: Glucose-Capillary: 89 mg/dL (ref 70–99)

## 2022-05-03 LAB — TSH: TSH: 4.318 u[IU]/mL (ref 0.350–4.500)

## 2022-05-03 LAB — CBG MONITORING, ED: Glucose-Capillary: 95 mg/dL (ref 70–99)

## 2022-05-03 MED ORDER — POTASSIUM CHLORIDE CRYS ER 20 MEQ PO TBCR
40.0000 meq | EXTENDED_RELEASE_TABLET | Freq: Once | ORAL | Status: AC
  Administered 2022-05-03: 40 meq via ORAL
  Filled 2022-05-03: qty 2

## 2022-05-03 MED ORDER — ACETAMINOPHEN 325 MG PO TABS
650.0000 mg | ORAL_TABLET | ORAL | Status: DC | PRN
  Administered 2022-05-04 – 2022-05-10 (×3): 650 mg via ORAL
  Filled 2022-05-03 (×3): qty 2

## 2022-05-03 MED ORDER — METOPROLOL SUCCINATE ER 100 MG PO TB24
100.0000 mg | ORAL_TABLET | Freq: Every day | ORAL | Status: DC
  Administered 2022-05-04 – 2022-05-11 (×8): 100 mg via ORAL
  Filled 2022-05-03: qty 2
  Filled 2022-05-03: qty 1
  Filled 2022-05-03: qty 2
  Filled 2022-05-03: qty 1
  Filled 2022-05-03: qty 2
  Filled 2022-05-03 (×3): qty 1

## 2022-05-03 MED ORDER — SODIUM BICARBONATE 650 MG PO TABS
650.0000 mg | ORAL_TABLET | Freq: Three times a day (TID) | ORAL | Status: DC
  Administered 2022-05-04 – 2022-05-11 (×23): 650 mg via ORAL
  Filled 2022-05-03 (×23): qty 1

## 2022-05-03 MED ORDER — APIXABAN 5 MG PO TABS
5.0000 mg | ORAL_TABLET | Freq: Two times a day (BID) | ORAL | Status: DC
  Administered 2022-05-04 (×3): 5 mg via ORAL
  Filled 2022-05-03 (×3): qty 1

## 2022-05-03 MED ORDER — VITAMIN B-12 100 MCG PO TABS
500.0000 ug | ORAL_TABLET | Freq: Every day | ORAL | Status: DC
  Administered 2022-05-04 – 2022-05-11 (×8): 500 ug via ORAL
  Filled 2022-05-03 (×2): qty 1
  Filled 2022-05-03: qty 5
  Filled 2022-05-03 (×4): qty 1
  Filled 2022-05-03: qty 5

## 2022-05-03 MED ORDER — THIAMINE MONONITRATE 100 MG PO TABS
100.0000 mg | ORAL_TABLET | Freq: Every day | ORAL | Status: DC
  Administered 2022-05-04 – 2022-05-11 (×8): 100 mg via ORAL
  Filled 2022-05-03 (×8): qty 1

## 2022-05-03 MED ORDER — MEGESTROL ACETATE 400 MG/10ML PO SUSP
400.0000 mg | Freq: Two times a day (BID) | ORAL | Status: DC
  Administered 2022-05-04 – 2022-05-11 (×15): 400 mg via ORAL
  Filled 2022-05-03 (×16): qty 10

## 2022-05-03 MED ORDER — ATORVASTATIN CALCIUM 40 MG PO TABS
40.0000 mg | ORAL_TABLET | Freq: Every day | ORAL | Status: DC
  Administered 2022-05-04 – 2022-05-05 (×2): 40 mg via ORAL
  Filled 2022-05-03 (×2): qty 1

## 2022-05-03 MED ORDER — SERTRALINE HCL 50 MG PO TABS
50.0000 mg | ORAL_TABLET | Freq: Every day | ORAL | Status: DC
  Administered 2022-05-04 – 2022-05-11 (×8): 50 mg via ORAL
  Filled 2022-05-03 (×8): qty 1

## 2022-05-03 MED ORDER — ALLOPURINOL 100 MG PO TABS
100.0000 mg | ORAL_TABLET | Freq: Every day | ORAL | Status: DC
  Administered 2022-05-04 – 2022-05-11 (×8): 100 mg via ORAL
  Filled 2022-05-03 (×8): qty 1

## 2022-05-03 MED ORDER — PANTOPRAZOLE SODIUM 40 MG PO TBEC
40.0000 mg | DELAYED_RELEASE_TABLET | Freq: Every day | ORAL | Status: DC
  Administered 2022-05-04 – 2022-05-10 (×7): 40 mg via ORAL
  Filled 2022-05-03 (×8): qty 1

## 2022-05-03 MED ORDER — ADENOSINE 6 MG/2ML IV SOLN: 6.0000 mg | Freq: Once | INTRAVENOUS | Status: DC

## 2022-05-03 MED ORDER — METHOCARBAMOL 500 MG PO TABS: 500.0000 mg | ORAL_TABLET | Freq: Three times a day (TID) | ORAL | Status: DC

## 2022-05-03 MED ORDER — LEVOTHYROXINE SODIUM 100 MCG PO TABS
100.0000 ug | ORAL_TABLET | Freq: Every day | ORAL | Status: DC
  Administered 2022-05-04 – 2022-05-11 (×8): 100 ug via ORAL
  Filled 2022-05-03 (×8): qty 1

## 2022-05-03 MED ORDER — ETOMIDATE 2 MG/ML IV SOLN
0.1000 mg/kg | Freq: Once | INTRAVENOUS | Status: AC
  Administered 2022-05-03: 6.78 mg via INTRAVENOUS
  Filled 2022-05-03: qty 10

## 2022-05-03 MED ORDER — ADENOSINE 6 MG/2ML IV SOLN: 12.0000 mg | Freq: Once | INTRAVENOUS | Status: DC | PRN

## 2022-05-03 MED ORDER — VITAMIN C 500 MG PO TABS
500.0000 mg | ORAL_TABLET | Freq: Two times a day (BID) | ORAL | Status: DC
  Administered 2022-05-04 – 2022-05-11 (×15): 500 mg via ORAL
  Filled 2022-05-03 (×15): qty 1

## 2022-05-03 MED ORDER — HYDRALAZINE HCL 25 MG PO TABS
25.0000 mg | ORAL_TABLET | Freq: Three times a day (TID) | ORAL | Status: DC
  Administered 2022-05-04 – 2022-05-05 (×4): 25 mg via ORAL
  Filled 2022-05-03 (×4): qty 1

## 2022-05-03 MED ORDER — CALCIUM GLUCONATE-NACL 1-0.675 GM/50ML-% IV SOLN
1.0000 g | Freq: Once | INTRAVENOUS | Status: AC
  Administered 2022-05-03: 1000 mg via INTRAVENOUS
  Filled 2022-05-03: qty 50

## 2022-05-03 MED ORDER — MAGNESIUM SULFATE 2 GM/50ML IV SOLN
2.0000 g | Freq: Once | INTRAVENOUS | Status: AC
  Administered 2022-05-03: 2 g via INTRAVENOUS
  Filled 2022-05-03: qty 50

## 2022-05-03 MED ORDER — VITAMIN D 25 MCG (1000 UNIT) PO TABS
1000.0000 [IU] | ORAL_TABLET | Freq: Every day | ORAL | Status: DC
  Administered 2022-05-04 – 2022-05-11 (×8): 1000 [IU] via ORAL
  Filled 2022-05-03 (×8): qty 1

## 2022-05-03 MED ORDER — INSULIN ASPART 100 UNIT/ML IJ SOLN
0.0000 [IU] | Freq: Three times a day (TID) | INTRAMUSCULAR | Status: DC
  Administered 2022-05-04 (×2): 1 [IU] via SUBCUTANEOUS
  Administered 2022-05-05: 2 [IU] via SUBCUTANEOUS
  Administered 2022-05-05: 1 [IU] via SUBCUTANEOUS
  Administered 2022-05-06 – 2022-05-07 (×3): 2 [IU] via SUBCUTANEOUS
  Administered 2022-05-07: 5 [IU] via SUBCUTANEOUS
  Administered 2022-05-07: 1 [IU] via SUBCUTANEOUS
  Administered 2022-05-08 (×2): 3 [IU] via SUBCUTANEOUS
  Administered 2022-05-08: 7 [IU] via SUBCUTANEOUS
  Administered 2022-05-10: 2 [IU] via SUBCUTANEOUS
  Administered 2022-05-10 – 2022-05-11 (×2): 1 [IU] via SUBCUTANEOUS
  Administered 2022-05-11: 2 [IU] via SUBCUTANEOUS

## 2022-05-03 MED ORDER — POLYETHYLENE GLYCOL 3350 17 G PO PACK
17.0000 g | PACK | Freq: Every day | ORAL | Status: DC
  Filled 2022-05-03 (×5): qty 1

## 2022-05-03 MED ORDER — TIZANIDINE HCL 2 MG PO TABS
2.0000 mg | ORAL_TABLET | Freq: Three times a day (TID) | ORAL | Status: DC
  Administered 2022-05-04 – 2022-05-11 (×23): 2 mg via ORAL
  Filled 2022-05-03 (×25): qty 1

## 2022-05-03 MED ORDER — POTASSIUM CHLORIDE CRYS ER 20 MEQ PO TBCR
20.0000 meq | EXTENDED_RELEASE_TABLET | Freq: Every day | ORAL | Status: DC
  Administered 2022-05-04 – 2022-05-10 (×7): 20 meq via ORAL
  Filled 2022-05-03 (×7): qty 1

## 2022-05-03 MED ORDER — CHLORHEXIDINE GLUCONATE CLOTH 2 % EX PADS: 6.0000 | MEDICATED_PAD | Freq: Every day | CUTANEOUS | Status: DC

## 2022-05-03 MED ORDER — MORPHINE SULFATE (PF) 4 MG/ML IV SOLN
4.0000 mg | Freq: Once | INTRAVENOUS | Status: AC
  Administered 2022-05-03: 4 mg via INTRAVENOUS
  Filled 2022-05-03: qty 1

## 2022-05-03 MED ORDER — ADULT MULTIVITAMIN W/MINERALS CH
1.0000 | ORAL_TABLET | Freq: Every day | ORAL | Status: DC
  Administered 2022-05-04 – 2022-05-11 (×8): 1 via ORAL
  Filled 2022-05-03 (×8): qty 1

## 2022-05-03 MED ORDER — ISOSORBIDE MONONITRATE ER 60 MG PO TB24
60.0000 mg | ORAL_TABLET | Freq: Every day | ORAL | Status: DC
  Administered 2022-05-04 – 2022-05-11 (×8): 60 mg via ORAL
  Filled 2022-05-03 (×2): qty 1
  Filled 2022-05-03 (×2): qty 2
  Filled 2022-05-03: qty 1
  Filled 2022-05-03: qty 2
  Filled 2022-05-03 (×2): qty 1

## 2022-05-03 MED ORDER — MIRTAZAPINE 7.5 MG PO TABS
7.5000 mg | ORAL_TABLET | Freq: Every day | ORAL | Status: DC
  Administered 2022-05-04 – 2022-05-10 (×8): 7.5 mg via ORAL
  Filled 2022-05-03 (×9): qty 1

## 2022-05-03 NOTE — ED Provider Notes (Signed)
This patient with history of atrial lesion, presenting for shaking episode at nursing facility.  On arrival he denies any symptoms.  He describes an episode of shortness of breath that occurred prior to arrival.  Although EMS did not identify any tachycardia, patient found to have wide-complex tachycardia on arrival.  On review of nursing facility documentation, patient has been receiving Eliquis twice a day without any missed doses in the past several weeks.  Patient underwent sedation and cardioversion.  .Sedation  Date/Time: 05/03/2022 2:24 PM  Performed by: Godfrey Pick, MD Authorized by: Godfrey Pick, MD   Consent:    Consent obtained:  Verbal and written   Consent given by:  Patient   Risks discussed:  Allergic reaction, dysrhythmia, prolonged hypoxia resulting in organ damage and respiratory compromise necessitating ventilatory assistance and intubation Universal protocol:    Immediately prior to procedure, a time out was called: yes   Indications:    Procedure performed:  Cardioversion Pre-sedation assessment:    Time since last food or drink:  5 hours   ASA classification: class 4 - patient with severe systemic disease that is a constant threat to life     Mouth opening:  2 finger widths   Thyromental distance:  3 finger widths   Mallampati score:  III - soft palate, base of uvula visible   Pre-sedation assessments completed and reviewed: airway patency, cardiovascular function, hydration status, mental status, nausea/vomiting, pain level and respiratory function     Pre-sedation assessment completed:  05/03/2022 2:00 PM Immediate pre-procedure details:    Reassessment: Patient reassessed immediately prior to procedure     Reviewed: vital signs     Verified: bag valve mask available, emergency equipment available, intubation equipment available, IV patency confirmed and oxygen available   Procedure details (see MAR for exact dosages):    Preoxygenation:  Nasal cannula   Sedation:   Etomidate   Intended level of sedation: deep and moderate (conscious sedation)   Analgesia:  None   Intra-procedure monitoring:  Blood pressure monitoring, cardiac monitor, continuous capnometry, continuous pulse oximetry, frequent LOC assessments and frequent vital sign checks   Intra-procedure events: respiratory depression     Intra-procedure management:  BVM ventilation   Total Provider sedation time (minutes):  15 Post-procedure details:    Post-sedation assessment completed:  05/03/2022 2:25 PM   Attendance: Constant attendance by certified staff until patient recovered     Recovery: Patient returned to pre-procedure baseline     Post-sedation assessments completed and reviewed: airway patency, cardiovascular function, hydration status, mental status, nausea/vomiting, pain level and respiratory function     Patient is stable for discharge or admission: yes     Procedure completion:  Tolerated well, no immediate complications .Cardioversion  Date/Time: 05/03/2022 2:25 PM  Performed by: Godfrey Pick, MD Authorized by: Godfrey Pick, MD   Consent:    Consent obtained:  Verbal and written   Risks discussed:  Death, induced arrhythmia and pain Pre-procedure details:    Cardioversion basis:  Elective   Rhythm:  Supraventricular tachycardia   Electrode placement:  Anterior-posterior Patient sedated: Yes. Refer to sedation procedure documentation for details of sedation.  Attempt one:    Cardioversion mode:  Synchronous   Shock (Joules):  120   Shock outcome:  Conversion to normal sinus rhythm Post-procedure details:    Patient status:  Awake   Patient tolerance of procedure:  Tolerated well, no immediate complications     Godfrey Pick, MD 05/03/22 1600

## 2022-05-03 NOTE — ED Triage Notes (Signed)
Pt arrived [facility] for a shaking episode that lasted less than 5 minutes. Pt did not urinate on himself and was alert and orient upon EMS arrival .

## 2022-05-03 NOTE — Progress Notes (Signed)
This RN spoke with patient sisters [name]  [phone] and [name] and updated them. Bryson Corona Edd Fabian

## 2022-05-03 NOTE — ED Notes (Signed)
Report given to [facility] with [place]. BSuper,RN

## 2022-05-03 NOTE — H&P (Signed)
Cardiology Admission History and Physical   Patient ID: Todd [name] MRN: [id number]; DOB: 27-Jan-1956   Admission date: 05/03/2022  PCP:  [name], DO   [facility] Providers Cardiologist:  Minus Breeding, MD  Electrophysiologist:  [name], MD       Chief Complaint:  [place]  Patient Profile:   Todd [name] is a 66 y.o. male with a PMHx of pAF on eliquis, CMO (LVEF 45-50% 04/2021), sustained VT, CKD stage 3b, prolonged Qtc, wheelchair bound, s/p bilateral AKA, T2DM,  peripheral arterial disease, hypertension, hyperlipidemia, chronic lower back pain and hx of polysubstance abuse, who is being seen 05/03/2022 for the evaluation of wide-complex tachycardia.  History of Present Illness:   Todd [name] is resident in a nursing home. He was found to have "shaking episode" which lasted less than 87mn.  Patient denies any severe symptoms only described the episode as very brief  shortness of breath.  He was transferred to the local ER. Patient was found to have wide-complex tachycardia on arrival at OSH and received one DCCV and was converted to sinus rhythm.  Patient is currently on room air, no acute distress, only complains of lower back pain. Denies fever, chills, dizziness, syncope, chest discomfort, heart palpitations, nausea, vomiting, abdominal fullness, dysuria, diarrhea, pedal edema or any bleeding events.  Patient is not sure his daily medications, but he tells for sure that his amiodarone was stopped several months ago. Per chart review, patient's amiodarone was discontinued likely this March due to concerning of prolonged Qtc from his cardiology. It appears that patient is not currently a candidate for ICD given other comorbidities including history of polysubstance abuse.  Cardiac catheterization was not completed due to chronic kidney disease.   Past Medical History:  Diagnosis Date   [place] ([place]) [place]     Alcohol abuse    [place]oc[place] 01/25/2021   Constipated    Diabetes mellitus without complication (HCC)    Diarrhea    Elevated LFTs    Goiter    Gout    Hyperlipidemia    Hypertension    Leukocytosis    Reactive thrombocytosis    [place] 08/2016   Right leg pain    Sepsis due to undetermined organism ([facility]    Thyroid disease    Wound infection after surgery 08/2016    Past Surgical History:  Procedure Laterality Date   ABDOMINAL AORTOGRAM N/A 08/11/2016   Procedure: Abdominal Aortogram;  Surgeon: BWaynetta Sandy MD;  [place] [facility] [place];  Service: Cardiovascular;  Laterality: N/A;   ABDOMINAL AORTOGRAM W/LOWER EXTREMITY N/A 08/15/2016   Procedure: Abdominal Aortogram w/Lower Extremity;  Surgeon: CElam Dutch MD;  [facility];  Service: Cardiovascular;  Laterality: N/A;   AMPUTATION Right 08/17/2016   Procedure: RIGHT BELOW KNEE AMPUTATION;  Surgeon: CElam Dutch MD;  [place] [facility]  Service: Vascular;  Laterality: Right;   AMPUTATION Right 09/12/2016   Procedure: AMPUTATION ABOVE KNEE;  Surgeon: MNewt Minion MD;  [place]  Service: Orthopedics;  Laterality: Right;   AMPUTATION Left 08/12/2016   Procedure: LEFT BELOW KNEE AMPUTATION;  Surgeon: DNewt Minion MD;  [place]  Service: Orthopedics;  Laterality: Left;   AMPUTATION Left 11/01/2017   Procedure: LEFT ABOVE KNEE AMPUTATION;  Surgeon: DNewt Minion MD;  [place]  Service: Orthopedics;  Laterality: Left;   APPLICATION OF WOUND VAC Right 09/12/2016  Procedure: APPLICATION OF WOUND VAC ABOVE KNEE;  Surgeon: Newt Minion, MD;  Location: [place];  Service: Orthopedics;  Laterality: Right;   BIOPSY  01/13/2021   Procedure: BIOPSY;  Surgeon: Rogene Houston, MD;  [facility];  Service: Endoscopy;;  esophageal   COLONOSCOPY WITH PROPOFOL N/A 01/13/2021   Procedure: COLONOSCOPY WITH PROPOFOL;  Surgeon: Rogene Houston, MD;  [facility];  Service: Endoscopy;  Laterality: N/A;   ESOPHAGOGASTRODUODENOSCOPY (EGD) WITH PROPOFOL N/A 01/13/2021   Procedure: ESOPHAGOGASTRODUODENOSCOPY (EGD) WITH PROPOFOL;  Surgeon: Rogene Houston, MD;  [facility];  Service: Endoscopy;  Laterality: N/A;   LOWER EXTREMITY ANGIOGRAPHY Bilateral 08/11/2016   Procedure: Lower Extremity Angiography;  Surgeon: Waynetta Sandy, MD;  Location: [place] [facility];  Service: Cardiovascular;  Laterality: Bilateral;   PERIPHERAL VASCULAR BALLOON ANGIOPLASTY Left 08/11/2016   Procedure: Peripheral Vascular Balloon Angioplasty;  Surgeon: Waynetta Sandy, MD;  Location: [place] [facility];  Service: Cardiovascular;  Laterality: Left;  SFA   POLYPECTOMY  01/13/2021   Procedure: POLYPECTOMY;  Surgeon: Rogene Houston, MD;  [facility];  Service: Endoscopy;;   THYROID SURGERY       Medications Prior to Admission: Prior to Admission medications   Medication Sig Start Date End Date Taking? Authorizing Provider  acetaminophen (TYLENOL) 325 MG tablet Take 2 tablets (650 mg total) by mouth every 6 (six) hours as needed for mild pain (or Fever >/= 101). 01/15/21  Yes Johnson, Clanford L, MD  allopurinol (ZYLOPRIM) 100 MG tablet Take 1 tablet (100 mg total) by mouth daily. 12/06/20  Yes Johnson, Clanford L, MD  apixaban (ELIQUIS) 5 MG TABS tablet Take 1 tablet (5 mg total) by mouth 2 (two) times daily. 01/16/21  Yes Johnson, Clanford L, MD  Ascorbic Acid 500 MG CAPS Take 500 mg by mouth 2 (two) times daily.   Yes [provider]  atorvastatin (LIPITOR) 40 MG tablet TAKE 1 TABLET BY MOUTH ONCE DAILY AT 6 PM Patient taking differently: Take 40 mg by mouth daily. 12/06/20  Yes Johnson, Clanford L, MD  cetirizine (ZYRTEC) 10 MG tablet Take 1 tablet (10 mg total) by mouth daily as needed for allergies. For allergy symptoms 12/06/20  Yes Johnson, Clanford L, MD  cholecalciferol (VITAMIN D3) 25 MCG (1000 UNIT) tablet Take 1,000  Units by mouth daily.   Yes [provider]  cyanocobalamin (V-R VITAMIN B-12) 500 MCG tablet Take 1 tablet (500 mcg total) by mouth daily. 02/09/22  Yes Pennington, Rebekah M, PA-C  hydrALAZINE (APRESOLINE) 25 MG tablet Take 1 tablet (25 mg total) by mouth 3 (three) times daily. 05/11/21  Yes Patrecia Pour, MD  insulin glargine (LANTUS) 100 UNIT/ML Solostar Pen Inject 16 Units into the skin at bedtime. 01/11/22  Yes Brita Romp, NP  isosorbide mononitrate (IMDUR) 30 MG 24 hr tablet Take 1 tablet (30 mg total) by mouth daily. Patient taking differently: Take 30 mg by mouth daily. 2 tablets by mouth once daily 05/28/21  Yes Vann, Jessica U, DO  levothyroxine (SYNTHROID) 100 MCG tablet Take 100 mcg by mouth daily. 03/29/22  Yes [provider]  metoprolol succinate (TOPROL-XL) 100 MG 24 hr tablet Take 1 tablet (100 mg total) by mouth daily. Take with or immediately following a meal. 05/12/21  Yes Patrecia Pour, MD  mirtazapine (REMERON) 7.5 MG tablet Take 7.5 mg by mouth at bedtime. 12/23/21  Yes [provider]  Multiple Vitamin (MULTIVITAMIN  WITH MINERALS) TABS tablet Take 1 tablet by mouth daily. 01/16/21  Yes Johnson, Clanford L, MD  Nutritional Supplements (NUTRITIONAL SHAKE PO) Take 237 mLs by mouth daily.   Yes [provider]  pantoprazole (PROTONIX) 40 MG tablet Take 1 tablet (40 mg total) by mouth 2 (two) times daily. Patient taking differently: Take 40 mg by mouth daily. 01/15/21  Yes Johnson, Clanford L, MD  polyethylene glycol (MIRALAX / GLYCOLAX) 17 g packet Take 17 g by mouth daily. 01/16/21  Yes Johnson, Clanford L, MD  sertraline (ZOLOFT) 50 MG tablet Take 1 tablet (50 mg total) by mouth at bedtime. Patient taking differently: Take 50 mg by mouth daily. 12/06/20  Yes Johnson, Clanford L, MD  sodium bicarbonate 650 MG tablet Take 650 mg by mouth 3 (three) times daily.   Yes [provider]  thiamine 100 MG tablet Take 1 tablet (100 mg total) by  mouth daily. 12/29/20  Yes Emokpae, Courage, MD  tiZANidine (ZANAFLEX) 2 MG tablet Take 2 mg by mouth 3 (three) times daily. 04/26/22  Yes [provider]  amiodarone (PACERONE) 200 MG tablet  08/24/21   [provider]  furosemide (LASIX) 20 MG tablet Take 20 mg by mouth 2 (two) times daily. Patient not taking: Reported on 05/03/2022 08/30/21   [provider]  Glucagon, rDNA, (GLUCAGON EMERGENCY) 1 MG KIT Inject 1 mg into the muscle daily as needed (Low blood sugar). Patient not taking: Reported on 05/03/2022 10/30/21   [provider]  glucose blood (ACCU-CHEK AVIVA PLUS) test strip Check blood sugars four times a day 03/12/18   [name], MD  haloperidol (HALDOL) 5 MG tablet Take by mouth. Patient not taking: Reported on 05/03/2022 11/04/21   [provider]  insulin lispro (HUMALOG) 100 UNIT/ML KwikPen Inject 3-9 Units into the skin 3 (three) times daily. Sliding scale 90-150-3 units, 151-200- 4units, 200-250 5units, 251-300 6units, 301-350 7units, 351-400 8units,401-450 9units Patient taking differently: Inject 1-6 Units into the skin 3 (three) times daily. Sliding scale 90-150-3 units, 151-200- 1units, 200-250- 2units, 251-300 3units, 301-350 4units, 351-400 5units, over 400 give 10 units and call provider 01/11/22   Brita Romp, NP  LORazepam (ATIVAN) 0.5 MG tablet Take 1 tablet (0.5 mg total) by mouth every 6 (six) hours as needed for anxiety. Patient not taking: Reported on 05/03/2022 05/28/21   Geradine Girt, DO  megestrol (MEGACE) 400 MG/10ML suspension Take 10 mLs (400 mg total) by mouth 2 (two) times daily. For appetite stimulation 12/06/20   Johnson, Clanford L, MD  methocarbamol (ROBAXIN) 500 MG tablet Take 500 mg by mouth 3 (three) times daily. 03/28/22   [provider]  potassium chloride SA (KLOR-CON M) 20 MEQ tablet Take 1 tablet (20 mEq total) by mouth daily. 05/29/21   Geradine Girt, DO  predniSONE (STERAPRED UNI-PAK  21 TAB) 10 MG (21) TBPK tablet Take by mouth daily. Take 6 tabs by mouth daily  for 2 days, then 5 tabs for 2 days, then 4 tabs for 2 days, then 3 tabs for 2 days, 2 tabs for 2 days, then 1 tab by mouth daily for 2 days 03/29/22   Godfrey Pick, MD  PROTEIN PO Take 30 mLs by mouth daily. Liquid protein for low albumin    [provider]  triamcinolone 0.1%-silver sulfadiazine 1:1 cream mixture Apply topically as needed.    [provider]     Allergies:    Allergies  Allergen Reactions   Lisinopril Other (  See Comments)    Hyperkalemia / Renal failure    Social History:   Social History   Socioeconomic History   Marital status: Single    Spouse name: Not on file   Number of children: Not on file   Years of education: Not on file   Highest education level: Not on file  Occupational History   Occupation: retired    Comment: drove a Medical illustrator  Tobacco Use   Smoking status: Every Day    Packs/day: 0.25    Years: 45.00    Total pack years: 11.25    Types: Cigarettes    Last attempt to quit: 11/12/2014    Years since quitting: 7.4    Passive exposure: Never   Smokeless tobacco: Never  Vaping Use   Vaping Use: Never used  Substance and Sexual Activity   Alcohol use: Not Currently    Alcohol/week: 2.0 standard drinks of alcohol    Types: 1 Glasses of wine, 1 Cans of beer per week   Drug use: Not Currently    Types: Cocaine   Sexual activity: Not Currently  Other Topics Concern   Not on file  Social History Narrative   09/23/20 - Lives alone, uses a wheelchair, double amputee, not married, no children - HHA comes in 3x per week to help him bathe and set up weekly meds.   Social Determinants of Health   Financial Resource Strain: Low Risk  (09/23/2020)   Overall Financial Resource Strain (CARDIA)    Difficulty of Paying Living Expenses: Not very hard  Food Insecurity: No Food Insecurity (09/23/2020)   Hunger Vital Sign    Worried About Running Out of Food in the  Last Year: Never true    Ran Out of Food in the Last Year: Never true  Transportation Needs: No Transportation Needs (09/23/2020)   PRAPARE - Hydrologist (Medical): No    Lack of Transportation (Non-Medical): No  Physical Activity: Inactive (09/23/2020)   Exercise Vital Sign    Days of Exercise per Week: 0 days    Minutes of Exercise per Session: 0 min  Stress: No Stress Concern Present (09/23/2020)   [place]    Feeling of Stress : Only a little  Social Connections: Socially Isolated (09/23/2020)   Social Connection and Isolation Panel [NHANES]    Frequency of Communication with Friends and Family: More than three times a week    Frequency of Social Gatherings with Friends and Family: More than three times a week    Attends Religious Services: Never    Marine scientist or Organizations: No    Attends Archivist Meetings: Never    Marital Status: Never married  Intimate Partner Violence: Not At Risk (09/23/2020)   Humiliation, Afraid, Rape, and Kick questionnaire    Fear of Current or Ex-Partner: No    Emotionally Abused: No    Physically Abused: No    Sexually Abused: No    Family History:   The patient's family history includes Diabetes in his maternal aunt and maternal uncle; Heart disease in his mother; Pneumonia in his father.    ROS:  Please see the history of present illness.  All other ROS reviewed and negative.     Physical Exam/Data:   Vitals:   05/03/22 1600 05/03/22 1700 05/03/22 1800 05/03/22 1900  BP: (!) 158/88 (!) 141/76 (!) 145/80 (!) 153/82  Pulse: 86 86 87 83  Resp: (!) 21 17 (!) 21 16  Temp:    98.2 F (36.8 C)  TempSrc:    Oral  SpO2: 100% 100% 100% 100%  Weight:      Height:       No intake or output data in the 24 hours ending 05/03/22 2044    05/03/2022    1:45 PM 03/29/2022    3:56 PM 10/06/2021    3:44 PM  Last 3 Weights  Weight  (lbs) 149 lb 8 oz 97 lb 96 lb  Weight (kg) 67.813 kg 44 kg 43.545 kg     Body mass index is 29.2 kg/m.  General:  Well nourished, well developed, in no acute distress HEENT: normal Neck: no JVD Vascular: No carotid bruits; Distal pulses 2+ bilaterally   Cardiac:  normal S1, S2; RRR; no murmur  Lungs:  clear to auscultation bilaterally, no wheezing, rhonchi or rales  Abd: soft, nontender, no hepatomegaly  Ext: no edema Musculoskeletal:  s/p bilateral AKA, BUE and BLE strength normal and equal Skin: warm and dry  Neuro:  CNs 2-12 intact, no focal abnormalities noted Psych:  Normal affect    Relevant CV Studies: cMRI 07/2021 Mildly decreased LV function.   There is multifocal and septal late gadolinium enhancement and scattered increase in ECV.   Clinically less consistent with cardiac amyloidosis.   In the setting of VT, consider PET assessment for Cardiac Sarcoidosis if clinically indicated.  Laboratory Data:  High Sensitivity Troponin:   Recent Labs  Lab 05/03/22 1419 05/03/22 1755  TROPONINIHS 30* 73*      Chemistry Recent Labs  Lab 05/03/22 1419 05/03/22 1421  NA 143 146*  K 3.5 3.7  CL 110 109  CO2 21*  --   GLUCOSE 99 98  BUN 24* 21  CREATININE 2.22* 2.00*  CALCIUM 7.0*  --   MG 1.3*  --   GFRNONAA 32*  --   ANIONGAP 12  --     No results for input(s): "PROT", "ALBUMIN", "AST", "ALT", "ALKPHOS", "BILITOT" in the last 168 hours. Lipids No results for input(s): "CHOL", "TRIG", "HDL", "LABVLDL", "LDLCALC", "CHOLHDL" in the last 168 hours. Hematology Recent Labs  Lab 05/03/22 1419 05/03/22 1421  WBC 9.5  --   RBC 4.71  --   HGB 12.9* 14.6  HCT 39.5 43.0  MCV 83.9  --   MCH 27.4  --   MCHC 32.7  --   RDW 16.0*  --   PLT 600*  --    Thyroid  Recent Labs  Lab 05/03/22 1419  TSH 4.318   BNPNo results for input(s): "BNP", "PROBNP" in the last 168 hours.  DDimer No results for input(s): "DDIMER" in the last 168 hours.   Radiology/Studies:   DG Chest Portable 1 View  Result Date: 05/03/2022 CLINICAL DATA:  Shaking episode lasting less than 5 minutes EXAM: PORTABLE CHEST 1 VIEW COMPARISON:  Portable exam 1424 hours compared to 05/27/2021 FINDINGS: External pacing leads. Upper normal heart size. Mediastinal contours and pulmonary vascularity normal. RIGHT basilar atelectasis. No pulmonary infiltrate, pleural effusion, or pneumothorax. Bones demineralized. IMPRESSION: RIGHT basilar atelectasis. Electronically Signed   By: Lavonia Dana M.D.   On: 05/03/2022 14:33     Assessment and Plan:   #Wide QRS tachycardia #Hx of sustained VT #Hx of prolonged QTc  -ECG on admission appears to MMVT (AV dissociation with atypical RBBB) -s/p shock x 1 at OSH, now sinus rhythm with VSS -amiodarone was discontinued in March (?) due to  concerning of prolonged Qtc -optimize his electrolytes -continue home metoprolol -patient is on multiple Qtc prolonging agents for his back pain and depression/anxiety, need closely monitor -consider EP consult daytime  #pAF on eliquis -now in sinus rhythm -continue eliquis and metoprolol  #CMO (LVEF 45-50% 04/2021) -appears euvolemic and warm on exam  -continue home metoprolol, hydralazine and imdur -acquire a TTE  #CKD stage 3b -Cr on admission at his baseline -continue home sodium bicarbonate  -continue to monitor   #Hypertension -continue home hydralazine  #T2DM -insulin sliding scale    #Chronic back pain -continue home robaxin and zanaflex  #hx of polysubstance abuse -urine drug screen pending  Risk Assessment/Risk Scores:    Severity of Illness: The appropriate patient status for this patient is INPATIENT. Inpatient status is judged to be reasonable and necessary in order to provide the required intensity of service to ensure the patient's safety. The patient's presenting symptoms, physical exam findings, and initial radiographic and laboratory data in the context of their chronic  comorbidities is felt to place them at high risk for further clinical deterioration. Furthermore, it is not anticipated that the patient will be medically stable for discharge from the hospital within 2 midnights of admission.   * I certify that at the point of admission it is my clinical judgment that the patient will require inpatient hospital care spanning beyond 2 midnights from the point of admission due to high intensity of service, high risk for further deterioration and high frequency of surveillance required.*   For questions or updates, please contact [place] Please consult [email or web address] for contact info under     Signed, Laurice Record, MD  05/03/2022 8:44 PM

## 2022-05-03 NOTE — Progress Notes (Signed)
Patient arrived via EMS from [facility] ED to [place] at 2230. Patient stable vital stable. GCS 15 on room air. Zoll pads in place on patient. Called and left a voice mail for sister [name] notifying her Mr Falco at [place] and doing well. CHG bath given. Snacks provided. Safety ensured. Erling Conte, RN

## 2022-05-03 NOTE — ED Notes (Signed)
EDP assessed pt prior to triage

## 2022-05-03 NOTE — ED Notes (Signed)
[facility] at bedside. Todd [name]

## 2022-05-03 NOTE — ED Provider Notes (Signed)
[facility] EMERGENCY DEPARTMENT Provider Note   CSN: [id number] Arrival date & time: 05/03/22  1333     History  Chief Complaint  Patient presents with   Shaking    Todd [name] is a 66 y.o. male.  With history of diabetes, hypertension, hyperlipidemia, A-fib on Eliquis, anxiety, depression who presents to the ED via EMS for evaluation of was described as a "shaking episode."  Patient lives in a nursing home and was found having this episode.  Reports states that it lasted less than 5 minutes and patient was fully alert and oriented afterwards.  Reports states that he did not bite his tongue or urinate himself.  On my initial evaluation patient is resting comfortably in the bed and states that he has no complaints.  Specifically denies chest pain, shortness of breath, cough, fevers, chills, dizziness, lightheadedness, headaches, abdominal pain, nausea, vomiting.  Nursing assistant at [facility] where patient lives reported that at around 12:00 today he became incoherent and was complaining of shortness of breath.  Vital signs were obtained and showed a pulse of 140, blood pressure of 96/58 and a blood sugar of 127 which prompted EMS to be called. HPI     Home Medications Prior to Admission medications   Medication Sig Start Date End Date Taking? Authorizing Provider  acetaminophen (TYLENOL) 325 MG tablet Take 2 tablets (650 mg total) by mouth every 6 (six) hours as needed for mild pain (or Fever >/= 101). 01/15/21   Johnson, Clanford L, MD  allopurinol (ZYLOPRIM) 100 MG tablet Take 1 tablet (100 mg total) by mouth daily. 12/06/20   Murlean Iba, MD  amiodarone (PACERONE) 200 MG tablet  08/24/21   [provider]  amLODipine (NORVASC) 5 MG tablet Take 1 tablet by mouth daily. 04/03/21   [provider]  apixaban (ELIQUIS) 5 MG TABS tablet Take 1 tablet (5 mg total) by mouth 2 (two) times daily. 01/16/21   Johnson, Clanford L, MD  Ascorbic Acid 500 MG  CAPS Take 500 mg by mouth 2 (two) times daily.    [provider]  atorvastatin (LIPITOR) 40 MG tablet TAKE 1 TABLET BY MOUTH ONCE DAILY AT 6 PM Patient taking differently: Take 40 mg by mouth daily. 12/06/20   Johnson, Clanford L, MD  calcium carbonate (TUMS - DOSED IN MG ELEMENTAL CALCIUM) 500 MG chewable tablet Chew 2 tablets (400 mg of elemental calcium total) by mouth 2 (two) times daily with a meal. Patient taking differently: Chew 2 tablets by mouth daily. 2 tablets before meals 01/15/21   Johnson, Clanford L, MD  cetirizine (ZYRTEC) 10 MG tablet Take 1 tablet (10 mg total) by mouth daily as needed for allergies. For allergy symptoms 12/06/20   Murlean Iba, MD  cholecalciferol (VITAMIN D3) 25 MCG (1000 UNIT) tablet Take 1,000 Units by mouth daily.    [provider]  cyanocobalamin (V-R VITAMIN B-12) 500 MCG tablet Take 1 tablet (500 mcg total) by mouth daily. 02/09/22   Harriett Rush, PA-C  folic acid (FOLVITE) 1 MG tablet Take 1 tablet (1 mg total) by mouth daily. 12/29/20   Roxan Hockey, MD  furosemide (LASIX) 20 MG tablet Take 20 mg by mouth 2 (two) times daily. 08/30/21   [provider]  Glucagon, rDNA, (GLUCAGON EMERGENCY) 1 MG KIT Inject 1 mg into the muscle daily as needed (Low blood sugar). 10/30/21   [provider]  glucose blood (ACCU-CHEK AVIVA PLUS) test strip Check blood sugars four  times a day 03/12/18   [name], MD  haloperidol (HALDOL) 5 MG tablet Take by mouth. 11/04/21   [provider]  hydrALAZINE (APRESOLINE) 25 MG tablet Take 1 tablet (25 mg total) by mouth 3 (three) times daily. 05/11/21   Patrecia Pour, MD  insulin glargine (LANTUS) 100 UNIT/ML Solostar Pen Inject 16 Units into the skin at bedtime. 01/11/22   Brita Romp, NP  insulin lispro (HUMALOG) 100 UNIT/ML KwikPen Inject 3-9 Units into the skin 3 (three) times daily. Sliding scale 90-150-3 units, 151-200- 4units, 200-250 5units, 251-300 6units,  301-350 7units, 351-400 8units,401-450 9units 01/11/22   Brita Romp, NP  isosorbide mononitrate (IMDUR) 30 MG 24 hr tablet Take 1 tablet (30 mg total) by mouth daily. Patient taking differently: Take 30 mg by mouth daily. 2 tablets by mouth once daily 05/28/21   Eulogio Bear U, DO  K Phos Mono-Sod Phos Di & Mono (PHOSPHA 250 NEUTRAL) [phone] MG TABS Take 1 tablet by mouth 2 (two) times daily. 12/31/20   [provider]  levofloxacin (LEVAQUIN) 750 MG tablet TAKE 1 TABLET (750 MG TOTAL) BY MOUTH IN THE MORNING FOR 4 DAYS. 12/25/20   [provider]  levothyroxine (SYNTHROID) 75 MCG tablet Take 1 tablet (75 mcg total) by mouth daily before breakfast. 01/11/22   Brita Romp, NP  LORazepam (ATIVAN) 0.5 MG tablet Take 1 tablet (0.5 mg total) by mouth every 6 (six) hours as needed for anxiety. 05/28/21   Geradine Girt, DO  megestrol (MEGACE) 400 MG/10ML suspension Take 10 mLs (400 mg total) by mouth 2 (two) times daily. For appetite stimulation 12/06/20   Johnson, Clanford L, MD  methocarbamol (ROBAXIN) 500 MG tablet Take 500 mg by mouth 3 (three) times daily. 03/28/22   [provider]  metoprolol succinate (TOPROL-XL) 100 MG 24 hr tablet Take 1 tablet (100 mg total) by mouth daily. Take with or immediately following a meal. 05/12/21   Patrecia Pour, MD  mirtazapine (REMERON) 7.5 MG tablet Take 7.5 mg by mouth at bedtime. 12/23/21   [provider]  Multiple Vitamin (MULTIVITAMIN WITH MINERALS) TABS tablet Take 1 tablet by mouth daily. 01/16/21   Johnson, Clanford L, MD  pantoprazole (PROTONIX) 40 MG tablet Take 1 tablet (40 mg total) by mouth 2 (two) times daily. 01/15/21   Johnson, Clanford L, MD  polyethylene glycol (MIRALAX / GLYCOLAX) 17 g packet Take 17 g by mouth daily. 01/16/21   Johnson, Clanford L, MD  potassium chloride SA (KLOR-CON M) 20 MEQ tablet Take 1 tablet (20 mEq total) by mouth daily. 05/29/21   Geradine Girt, DO  predniSONE (STERAPRED  UNI-PAK 21 TAB) 10 MG (21) TBPK tablet Take by mouth daily. Take 6 tabs by mouth daily  for 2 days, then 5 tabs for 2 days, then 4 tabs for 2 days, then 3 tabs for 2 days, 2 tabs for 2 days, then 1 tab by mouth daily for 2 days 03/29/22   Godfrey Pick, MD  PROTEIN PO Take 30 mLs by mouth daily. Liquid protein for low albumin    [provider]  sertraline (ZOLOFT) 50 MG tablet Take 1 tablet (50 mg total) by mouth at bedtime. Patient taking differently: Take 50 mg by mouth daily. 12/06/20   Johnson, Clanford L, MD  sodium bicarbonate 650 MG tablet Take 650 mg by mouth 3 (three) times daily.    [provider]  thiamine 100 MG tablet Take 1 tablet (100 mg  total) by mouth daily. 12/29/20   Roxan Hockey, MD  triamcinolone 0.1%-silver sulfadiazine 1:1 cream mixture Apply topically as needed.    [provider]      Allergies    Lisinopril    Review of Systems   Review of Systems  Neurological:  Positive for tremors.  All other systems reviewed and are negative.   Physical Exam Updated Vital Signs BP (!) 158/88   Pulse 86   Temp 98 F (36.7 C)   Resp (!) 21   Ht 5' (1.524 m)   Wt 67.8 kg   SpO2 100%   BMI 29.20 kg/m  Physical Exam Vitals and nursing note reviewed.  Constitutional:      General: He is not in acute distress.    Appearance: He is well-developed. He is diaphoretic. He is not ill-appearing or toxic-appearing.  HENT:     Head: Normocephalic and atraumatic.     Mouth/Throat:     Mouth: Mucous membranes are moist.     Pharynx: Oropharynx is clear. No oropharyngeal exudate or posterior oropharyngeal erythema.  Eyes:     Extraocular Movements: Extraocular movements intact.     Conjunctiva/sclera: Conjunctivae normal.     Pupils: Pupils are equal, round, and reactive to light.  Cardiovascular:     Rate and Rhythm: Regular rhythm. Tachycardia present.     Comments: Initially patient was in SVT with a heart rate of 165.  Bilateral radial pulses  were thready.  Carotid pulses were irregular and fast Pulmonary:     Effort: Pulmonary effort is normal. No respiratory distress.     Breath sounds: Normal breath sounds. No stridor. No wheezing or rhonchi.  Abdominal:     Palpations: Abdomen is soft.     Tenderness: There is no abdominal tenderness. There is no guarding.  Musculoskeletal:        General: No swelling.     Cervical back: Normal range of motion and neck supple.     Comments: Bilateral above-the-knee amputation  Skin:    General: Skin is warm.     Capillary Refill: Capillary refill takes less than 2 seconds.  Neurological:     Mental Status: He is alert.  Psychiatric:        Mood and Affect: Mood normal.     ED Results / Procedures / Treatments   Labs (all labs ordered are listed, but only abnormal results are displayed) Labs Reviewed  BASIC METABOLIC PANEL - Abnormal; Notable for the following components:      Result Value   CO2 21 (*)    BUN 24 (*)    Creatinine, Ser 2.22 (*)    Calcium 7.0 (*)    GFR, Estimated 32 (*)    All other components within normal limits  MAGNESIUM - Abnormal; Notable for the following components:   Magnesium 1.3 (*)    All other components within normal limits  CBC WITH DIFFERENTIAL/PLATELET - Abnormal; Notable for the following components:   Hemoglobin 12.9 (*)    RDW 16.0 (*)    Platelets 600 (*)    All other components within normal limits  I-STAT CHEM 8, ED - Abnormal; Notable for the following components:   Sodium 146 (*)    Creatinine, Ser 2.00 (*)    Calcium, Ion 0.88 (*)    TCO2 20 (*)    All other components within normal limits  TROPONIN I (HIGH SENSITIVITY) - Abnormal; Notable for the following components:   Troponin I (High Sensitivity) 30 (*)  All other components within normal limits  TSH  CBG MONITORING, ED  TROPONIN I (HIGH SENSITIVITY)    EKG EKG Interpretation  Date/Time:  Tuesday May 03 2022 14:21:51 EST Ventricular Rate:  92 PR  Interval:  159 QRS Duration: 99 QT Interval:  402 QTC Calculation: 498 R Axis:   -3 Text Interpretation: Sinus rhythm Anteroseptal infarct, age indeterminate Lateral leads are also involved Confirmed by Godfrey Pick [phone]) on 05/03/2022 3:58:59 PM  Radiology DG Chest Portable 1 View  Result Date: 05/03/2022 CLINICAL DATA:  Shaking episode lasting less than 5 minutes EXAM: PORTABLE CHEST 1 VIEW COMPARISON:  Portable exam 1424 hours compared to 05/27/2021 FINDINGS: External pacing leads. Upper normal heart size. Mediastinal contours and pulmonary vascularity normal. RIGHT basilar atelectasis. No pulmonary infiltrate, pleural effusion, or pneumothorax. Bones demineralized. IMPRESSION: RIGHT basilar atelectasis. Electronically Signed   By: Lavonia Dana M.D.   On: 05/03/2022 14:33    Procedures Procedures    Medications Ordered in ED Medications  magnesium sulfate IVPB 2 g 50 mL (has no administration in time range)  calcium gluconate 1 g/ 50 mL sodium chloride IVPB (has no administration in time range)  potassium chloride SA (KLOR-CON M) CR tablet 40 mEq (has no administration in time range)  etomidate (AMIDATE) injection 6.78 mg (6.78 mg Intravenous Given 05/03/22 1419)    ED Course/ Medical Decision Making/ A&P Clinical Course as of 05/03/22 1859  Tue May 03, 2022  1758 Spoke with cardiology Dr. Nechama Guard.  He will be transferred to [facility] [[facility]]    Clinical Course User Index [AS] Claudie Fisherman Grafton Folk, PA-C                           Medical Decision Making Amount and/or Complexity of Data Reviewed Labs: ordered. Radiology: ordered.  Risk Prescription drug management.  This patient presents to the ED for concern of tremors, this involves an extensive number of treatment options, and is a complaint that carries with it a high risk of complications and morbidity.  The differential diagnosis includes ACS, cardiac rhythm abnormality, syncope, seizure   Co morbidities that complicate  the patient evaluation  diabetes, hypertension, hyperlipidemia, A-fib on Eliquis, anxiety, depression, cardiomyopathy, stage IV CKD  My initial workup includes cardioversion, i-STAT Chem-8, BMP, CBC, magnesium, troponin, LR  Additional history obtained from: Nursing notes from this visit. EMS provides a portion of the history Nursing home was called and provides a portion of the history  I ordered, reviewed and interpreted labs which include: BMP, CBC, troponin, magnesium, TSH.  Chronically elevated troponin at 30.  Hypomagnesemia at 1.3.  Thrombocytopenia at 600.  Creatinine elevated above baseline at 2.22, hypocalcemia at 7.0   I ordered imaging studies including chest x-ray I independently visualized and interpreted imaging which showed right basilar atelectasis I agree with the radiologist interpretation  Cardiac Monitoring:  The patient was maintained on a cardiac monitor.  I personally viewed and interpreted the cardiac monitored which showed an underlying rhythm of: Initially V. tach, then NSR after cardioversion  Consultations Obtained:  I requested consultation with the cardiology Dr. Gardiner Rhyme,  and discussed lab and imaging findings as well as pertinent plan - they recommend: Admission to [facility] cardiac unit  Afebrile, hemodynamically stable after cardioversion.  66 year old male presents to the ED via EMS for evaluation of strokelike versus seizure symptoms.  When I called the nursing home they stated that he had a pulse of 140 and was  hypotensive at 96/58 as well as being incoherent and complaining of shortness of breath.  EKG was now performed by EMS prior to arrival.  When patient arrived to the ED he was placed on the monitor and it showed a wide-complex tachycardia with a rate of 165.  The patient is anticoagulated on Eliquis so the decision was made to electrically cardiovert the patient.  He was consciously sedated using etomidate and successfully cardioverted.  He  became hemodynamically stable immediately afterwards.  Had no complaints after cardioversion.  Lab workup significant for hypomagnesemia hypocalcemia, borderline hypokalemia, and an elevated serum creatinine.  P.o. potassium and boluses of magnesium and calcium were ordered.  Cardiology was consulted.  Patient does have a history of V. Tach.  It was decided that patient should be transferred to the cardiac unit at [facility] for further workup and management as well as continued repletion and monitoring of his electrolytes.  Temporary admission orders placed.  Patient was stable at the time of disposition.  Patient's case discussed with Dr. Doren Custard who agrees with plan to discharge with follow-up.   Note: Portions of this report may have been transcribed using voice recognition software. Every effort was made to ensure accuracy; however, inadvertent computerized transcription errors may still be present.          Final Clinical Impression(s) / ED Diagnoses Final diagnoses:  Ventricular tachyarrhythmia ([place])  Hypomagnesemia  Hypocalcemia    Rx / DC Orders ED Discharge Orders     None         Roylene Reason, PA-C 05/03/22 1859    Godfrey Pick, MD 05/04/22 1030

## 2022-05-04 ENCOUNTER — Inpatient Hospital Stay (HOSPITAL_COMMUNITY): Payer: Medicare (Managed Care)

## 2022-05-04 DIAGNOSIS — Z89611 Acquired absence of right leg above knee: Secondary | ICD-10-CM

## 2022-05-04 DIAGNOSIS — Z794 Long term (current) use of insulin: Secondary | ICD-10-CM

## 2022-05-04 DIAGNOSIS — I429 Cardiomyopathy, unspecified: Secondary | ICD-10-CM

## 2022-05-04 DIAGNOSIS — I480 Paroxysmal atrial fibrillation: Secondary | ICD-10-CM | POA: Diagnosis not present

## 2022-05-04 DIAGNOSIS — I472 Ventricular tachycardia, unspecified: Secondary | ICD-10-CM | POA: Diagnosis not present

## 2022-05-04 DIAGNOSIS — I5042 Chronic combined systolic (congestive) and diastolic (congestive) heart failure: Secondary | ICD-10-CM

## 2022-05-04 DIAGNOSIS — Z89612 Acquired absence of left leg above knee: Secondary | ICD-10-CM

## 2022-05-04 DIAGNOSIS — N1832 Chronic kidney disease, stage 3b: Secondary | ICD-10-CM

## 2022-05-04 DIAGNOSIS — R0602 Shortness of breath: Secondary | ICD-10-CM | POA: Diagnosis not present

## 2022-05-04 DIAGNOSIS — E1122 Type 2 diabetes mellitus with diabetic chronic kidney disease: Secondary | ICD-10-CM

## 2022-05-04 LAB — BASIC METABOLIC PANEL
Anion gap: 8 (ref 5–15)
BUN: 21 mg/dL (ref 8–23)
CO2: 20 mmol/L — ABNORMAL LOW (ref 22–32)
Calcium: 7.1 mg/dL — ABNORMAL LOW (ref 8.9–10.3)
Chloride: 113 mmol/L — ABNORMAL HIGH (ref 98–111)
Creatinine, Ser: 1.47 mg/dL — ABNORMAL HIGH (ref 0.61–1.24)
GFR, Estimated: 52 mL/min — ABNORMAL LOW (ref >60)
Glucose, Bld: 109 mg/dL — ABNORMAL HIGH (ref 70–99)
Potassium: 4 mmol/L (ref 3.5–5.1)
Sodium: 141 mmol/L (ref 135–145)

## 2022-05-04 LAB — ECHOCARDIOGRAM COMPLETE
Area-P 1/2: 2.63 cm2
Calc EF: 49.5 %
Height: 60 in
S' Lateral: 4.3 cm
Single Plane A2C EF: 56 %
Single Plane A4C EF: 39.7 %
Weight: 1615.53 oz

## 2022-05-04 LAB — GLUCOSE, CAPILLARY
Glucose-Capillary: 123 mg/dL — ABNORMAL HIGH (ref 70–99)
Glucose-Capillary: 130 mg/dL — ABNORMAL HIGH (ref 70–99)
Glucose-Capillary: 131 mg/dL — ABNORMAL HIGH (ref 70–99)
Glucose-Capillary: 206 mg/dL — ABNORMAL HIGH (ref 70–99)
Glucose-Capillary: 88 mg/dL (ref 70–99)

## 2022-05-04 LAB — CBC
HCT: 35.5 % — ABNORMAL LOW (ref 39.0–52.0)
Hemoglobin: 11.5 g/dL — ABNORMAL LOW (ref 13.0–17.0)
MCH: 27.2 pg (ref 26.0–34.0)
MCHC: 32.4 g/dL (ref 30.0–36.0)
MCV: 83.9 fL (ref 80.0–100.0)
Platelets: 548 10*3/uL — ABNORMAL HIGH (ref 150–400)
RBC: 4.23 MIL/uL (ref 4.22–5.81)
RDW: 15.6 % — ABNORMAL HIGH (ref 11.5–15.5)
WBC: 9.7 10*3/uL (ref 4.0–10.5)
nRBC: 0 % (ref 0.0–0.2)

## 2022-05-04 LAB — RAPID URINE DRUG SCREEN, HOSP PERFORMED
Amphetamines: NOT DETECTED
Barbiturates: NOT DETECTED
Benzodiazepines: NOT DETECTED
Cocaine: NOT DETECTED
Opiates: POSITIVE — AB
Tetrahydrocannabinol: NOT DETECTED

## 2022-05-04 LAB — MRSA NEXT GEN BY PCR, NASAL: MRSA by PCR Next Gen: DETECTED — AB

## 2022-05-04 LAB — MAGNESIUM: Magnesium: 1.9 mg/dL (ref 1.7–2.4)

## 2022-05-04 LAB — HIV ANTIBODY (ROUTINE TESTING W REFLEX): HIV Screen 4th Generation wRfx: NONREACTIVE

## 2022-05-04 MED ORDER — DICLOFENAC SODIUM 1 % EX GEL
2.0000 g | Freq: Two times a day (BID) | CUTANEOUS | Status: DC | PRN
  Filled 2022-05-04 (×3): qty 100

## 2022-05-04 MED ORDER — PERFLUTREN LIPID MICROSPHERE
1.0000 mL | INTRAVENOUS | Status: AC | PRN
  Administered 2022-05-04: 4 mL via INTRAVENOUS

## 2022-05-04 MED ORDER — ORAL CARE MOUTH RINSE: 15.0000 mL | OROMUCOSAL | Status: DC | PRN

## 2022-05-04 MED ORDER — PNEUMOCOCCAL 20-VAL CONJ VACC 0.5 ML IM SUSY
0.5000 mL | PREFILLED_SYRINGE | INTRAMUSCULAR | Status: DC
  Filled 2022-05-04: qty 0.5

## 2022-05-04 MED ORDER — LIDOCAINE 5 % EX PTCH
1.0000 | MEDICATED_PATCH | CUTANEOUS | Status: DC
  Administered 2022-05-04 – 2022-05-10 (×7): 1 via TRANSDERMAL
  Filled 2022-05-04 (×7): qty 1

## 2022-05-04 MED ORDER — MUPIROCIN 2 % EX OINT
1.0000 | TOPICAL_OINTMENT | Freq: Two times a day (BID) | CUTANEOUS | Status: AC
  Administered 2022-05-04 – 2022-05-08 (×10): 1 via NASAL
  Filled 2022-05-04 (×2): qty 22

## 2022-05-04 MED ORDER — CHLORHEXIDINE GLUCONATE CLOTH 2 % EX PADS
6.0000 | MEDICATED_PAD | Freq: Every day | CUTANEOUS | Status: AC
  Administered 2022-05-04 – 2022-05-07 (×4): 6 via TOPICAL

## 2022-05-04 MED ORDER — MEXILETINE HCL 150 MG PO CAPS
150.0000 mg | ORAL_CAPSULE | Freq: Three times a day (TID) | ORAL | Status: DC
  Administered 2022-05-04 – 2022-05-11 (×22): 150 mg via ORAL
  Filled 2022-05-04 (×24): qty 1

## 2022-05-04 NOTE — Progress Notes (Signed)
$  30.00 cash sent to security and copy of patient valuables envelope placed in chart. Cost of pink MOST form placed in chart. Copy of [facility] chart placed in [place] chart. Erling Conte, RN

## 2022-05-04 NOTE — Progress Notes (Signed)
Rounding Note    Patient Name: Todd [name] Date of Encounter: 05/04/2022  [place] Cardiologist: Minus Breeding, MD   Subjective   Denies angina or dyspnea. Low back discomfort.  Inpatient Medications    Scheduled Meds:  allopurinol  100 mg Oral Daily   apixaban  5 mg Oral BID   ascorbic acid  500 mg Oral BID   atorvastatin  40 mg Oral Daily   Chlorhexidine Gluconate Cloth  6 each Topical Q0600   cholecalciferol  1,000 Units Oral Daily   cyanocobalamin  500 mcg Oral Daily   hydrALAZINE  25 mg Oral TID   insulin aspart  0-9 Units Subcutaneous TID WC   isosorbide mononitrate  60 mg Oral Daily   levothyroxine  100 mcg Oral Daily   megestrol  400 mg Oral BID   metoprolol succinate  100 mg Oral Daily   mirtazapine  7.5 mg Oral QHS   multivitamin with minerals  1 tablet Oral Daily   mupirocin ointment  1 Application Nasal BID   pantoprazole  40 mg Oral Daily   [START ON 05/05/2022] pneumococcal 20-valent conjugate vaccine  0.5 mL Intramuscular Tomorrow-1000   polyethylene glycol  17 g Oral Daily   potassium chloride SA  20 mEq Oral Daily   sertraline  50 mg Oral Daily   sodium bicarbonate  650 mg Oral TID   thiamine  100 mg Oral Daily   tiZANidine  2 mg Oral TID   Continuous Infusions:  PRN Meds: acetaminophen, mouth rinse   Vital Signs    Vitals:   05/04/22 0700 05/04/22 0800 05/04/22 0900 05/04/22 1000  BP: 117/66 125/66 133/66 (!) 161/92  Pulse: 61 64 60 65  Resp: '11 13 12 13  '$ Temp:      TempSrc:  Oral    SpO2: 98% 98% 98% 98%  Weight:      Height:        Intake/Output Summary (Last 24 hours) at 05/04/2022 1032 Last data filed at 05/04/2022 0500 Gross per 24 hour  Intake 900.41 ml  Output 250 ml  Net 650.41 ml      05/03/2022   10:45 PM 05/03/2022    1:45 PM 03/29/2022    3:56 PM  Last 3 Weights  Weight (lbs) 100 lb 15.5 oz 149 lb 8 oz 97 lb  Weight (kg) 45.8 kg 67.813 kg 44 kg      Telemetry    Normal sinus rhythm-  Personally Reviewed  ECG    Initial ECG shows monomorphic ventricular tachycardia 164 bpm, BB morphology/positive R in V1 with transition in V6 left axis deviation QRS is relatively narrowed 136 ms, consider left posterior fascicular VT  Left second tracing shows normal sinus rhythm, Q waves in leads V1-V2, 1, aVL, diffuse ST segment depression, QTc 498 ms- Personally Reviewed  Physical Exam  Appears chronically ill GEN: No acute distress.   Neck: No JVD Cardiac: RRR, normal S1, loud P2, no murmurs, rubs, or gallops.  Respiratory: Clear to auscultation bilaterally. GI: Soft, nontender, non-distended  MS: No edema; B AKA Neuro:  Nonfocal  Psych: Normal affect   Labs    High Sensitivity Troponin:   Recent Labs  Lab 05/03/22 1419 05/03/22 1755  TROPONINIHS 30* 73*     Chemistry Recent Labs  Lab 05/03/22 1419 05/03/22 1421 05/04/22 0305  NA 143 146* 141  K 3.5 3.7 4.0  CL 110 109 113*  CO2 21*  --  20*  GLUCOSE 99 98  109*  BUN 24* 21 21  CREATININE 2.22* 2.00* 1.47*  CALCIUM 7.0*  --  7.1*  MG 1.3*  --  1.9  GFRNONAA 32*  --  52*  ANIONGAP 12  --  8    Lipids No results for input(s): "CHOL", "TRIG", "HDL", "LABVLDL", "LDLCALC", "CHOLHDL" in the last 168 hours.  Hematology Recent Labs  Lab 05/03/22 1419 05/03/22 1421 05/04/22 0305  WBC 9.5  --  9.7  RBC 4.71  --  4.23  HGB 12.9* 14.6 11.5*  HCT 39.5 43.0 35.5*  MCV 83.9  --  83.9  MCH 27.4  --  27.2  MCHC 32.7  --  32.4  RDW 16.0*  --  15.6*  PLT 600*  --  548*   Thyroid  Recent Labs  Lab 05/03/22 1419  TSH 4.318    BNPNo results for input(s): "BNP", "PROBNP" in the last 168 hours.  DDimer No results for input(s): "DDIMER" in the last 168 hours.   Radiology    DG Chest Portable 1 View  Result Date: 05/03/2022 CLINICAL DATA:  Shaking episode lasting less than 5 minutes EXAM: PORTABLE CHEST 1 VIEW COMPARISON:  Portable exam 1424 hours compared to 05/27/2021 FINDINGS: External pacing leads. Upper  normal heart size. Mediastinal contours and pulmonary vascularity normal. RIGHT basilar atelectasis. No pulmonary infiltrate, pleural effusion, or pneumothorax. Bones demineralized. IMPRESSION: RIGHT basilar atelectasis. Electronically Signed   By: Lavonia Dana M.D.   On: 05/03/2022 14:33    Cardiac Studies   Cardiac MRI 07/22/2021  FINDINGS: 1. Mildly increased left ventricular size, with LVEDD 59 mm, but LVEDVi 95 mL/m2.   Moderate concentric hypertrophy, with intraventricular septal thickness of 12 mm, posterior wall thickness of 12 mm, but with a myocardial mass index of 100 g/m2.   Anterolateral papillary muscle hypertrophy.   Normal left ventricular systolic function (LVEF =99 %). There is basal inferoseptal hypokinesis.   Left ventricular parametric mapping notable for scattered increase in the T2 signal (65%) in the basal inferoseptal and basal anterolateral.   ECV is elevated as high as 80% scattered throughout the base (anteroseptal, inferoseptal, anterolateral, inferolateral) and mid (septal, inferior, anterolateral).   There is late gadolinium enhancement in the left ventricular myocardium: Basal septal LGE in the mid myocardium, basal anterior LGE in the mid myocardium, mid anteroseptal LGE in the mid myocardium, and mid inferior LGE in the mid myocardium.   2. Normal right ventricular size with RVEDVI 73 mL/m2.   Normal right ventricular thickness.   Normal right ventricular systolic function (RVEF =83%). There are no regional wall motion abnormalities or aneurysms.   3.  Normal left and right atrial size.   4. Normal size of the aortic root, ascending aorta and pulmonary artery.   5. Valve assessment:   Aortic Valve: Tri-leaflet aortic valve. Qualitatively no significant regurgitation.   Pulmonic Valve: Qualitatively mild significant regurgitation.   Tricuspid Valve: Qualitatively no significant regurgitation.   Mitral Valve: No significant  regurgitation.   6.  Normal pericardium.  No pericardial effusion.   7. Grossly, no extracardiac findings. Recommended dedicated study if concerned for non-cardiac pathology.   8. Breath[place] Artifacts noted. Much of the study appears to be free breathing. This decreased the sensitivity of volumetric assessments. IMPRESSION: Mildly decreased LV function.   There is multifocal and septal late gadolinium enhancement and scattered increase in ECV.   Clinically less consistent with cardiac amyloidosis.   In the setting of VT, consider PET assessment for Cardiac Sarcoidosis if clinically  indicated.    Echocardiogram 04/20/2021   1. Left ventricular ejection fraction, by estimation, is 45 to 50%. The  left ventricle has mildly decreased function. There is moderate left  ventricular hypertrophy.   2. Right ventricular systolic function is normal. The right ventricular  size is normal.   3. Left atrial size was severely dilated.   4. Right atrial size was moderately dilated.   5. The inferior vena cava is normal in size with greater than 50%  respiratory variability, suggesting right atrial pressure of 3 mmHg.   6. Limited echo evaluate LV function.   Patient Profile     66 y.o. male with a PMHx of pAFib on eliquis, cardiomyopathy of uncertain etiology (LVEF 45-50% 04/2021), sustained VT, CKD stage 3b, history of prolonged Qtc on amiodarone, wheelchair bound, PAD s/p bilateral AKA, T2DM,  peripheral arterial disease, hypertension, hyperlipidemia, chronic lower back pain and hx of polysubstance abuse, who is being seen 05/03/2022 for sustained ventricular tachycardia requiring cardioversion  Assessment & Plan    VT: No recurrence since admission here.  Have asked for EP consultation.  If this is posterior fascicular VT, is there an option for ablation?  Previously declined for ICD due to ongoing polysubstance abuse and compliance issues.  History of excessive QT prolongation on  amiodarone.  Consider mexiletine.  Potassium was borderline low on arrival, try to keep potassium at least 4.0 and magnesium at least 2.0. CMP: Exact etiology uncertain (see MRI results above).  Does have a history of extensive PAD with bilateral amputations so coronary disease is definitely possible.  However, the pattern of gadolinium enhancement is not consistent with ischemic cardiomyopathy.  Have not been able to complete coronary angiography due to renal dysfunction.  Clinically he appears euvolemic.  Guideline directed medical therapy is limited due to his comorbid conditions. History of persistent atrial fibrillation: Currently in normal sinus rhythm. S/P bilateral above-the-knee amputation of the lower extremities: Able to get around in a wheelchair. CKD 3: Currently his creatinine is actually better than his previous baseline around 2.0.  Usually GFR in 30-35 range DM: Well-controlled.  Most recent hemoglobin A1c was actually low at 5.6%. HLP: Do not have any lipid profile since April 2022 and his total cholesterol and LDL cholesterol was very low.  For questions or updates, please contact [place] Please consult [email or web address] for contact info under        Signed, Sanda Klein, MD  05/04/2022, 10:32 AM

## 2022-05-04 NOTE — Progress Notes (Signed)
  Echocardiogram 2D Echocardiogram has been performed.  Wynelle Link 05/04/2022, 11:00 AM

## 2022-05-04 NOTE — Consult Note (Addendum)
Cardiology Consultation   Patient ID: Todd [name] MRN: [id number]; DOB: April 10, 1956  Admit date: 05/03/2022 Date of Consult: 05/04/2022  PCP:  [name], [place] [facility] Cardiologist:  Minus Breeding, MD  Electrophysiologist:  [name], MD  {   Patient Profile:   Todd [name] is a 66 y.o. male with a hx of HTN, hypothyroidism, DM, AFib (described as chronic), PVD (s/p b/l AKA), polysubstance abuse (ETOH, tobacco and cocaine),  CKD (IIIb), who is being seen 05/04/2022 for the evaluation of VT at the request of Dr. Sallyanne Kuster.  History of Present Illness:   Todd [name] was hospitalized in July 11-15 2022 with weakness.  He was using cocaine daily and in a wheelchair.  He was treated for UTI.  He was cocaine positive and TSH was 223 - compliance was questioned. He was discharged and readmitted 12/26/20 with a syncopal event found to be in Afib. He also had NSVT in the setting of electrolyte derangement (low phos, low Ca, Mg 1.1, and severely abnormal thyroid). He was placed on eliquis and metoprolol and discharged on 12/29/20. Echo at that time showed LVEF 55-60%, grade 1 DD, and no significant valvular disease.   He was admitted again 01/10/21 for weakness, found down and covered in feces. Again with electrolyte derangements and Afib RVR. He was anemic with a heme positive stool. GI was consulted, endoscopy unrevealing, and eliquis resumed. He was discharged to SNF and discharged home after rehabilitation, but could not care for himself and admitted back to [facility].   presented to ED 04/19/21 from SNF with SOB found to be hypoxic. SOB started between Sunday and Monday night. He was admitted for acute respiratory failure with hypoxia secondary to CAP  Intermittent ATach, cardiology on board and BB titrated, renal function limiting GDMT, on/off diuretics with AKI/CKD,  Developed episodes NSVTs >> sustained VT, mild hypokalemia replaced,  suspected ischemic source Renal function and PVD limiting options for ischemic and non-ischemic eval, started on amiodarone Eventually transitioned to PO amiodarone He was seen by EP, with hemodyncamically stable VT, low normal LVEF, not felt indicated for ICD< though did recommend further investigation to etiology as able Discharged 05/11/21  Outpt follow up with EP, last with Dr. Lambert jan 2023, no symptoms of VT, maintained on amiodarone, and not felt a candidate for ICD with co morbidities and polysubstance abuse.  Eventually amiodarone stopped 2/2 prolonged QT  Admitted overnight via [facility], presented with SOB and a "shaking episode" at SNF > apparently not found tachycardic by EMS though found in a WCT in the ER and cardioverted in the ER  Lives at [facility]  EKG did not transfer with the patient Reportedly found in VT CV to SR  LABS K+ 3.5 > 3.7 >> 4.0 Mag 1.3 >> 1.9 Ca 7.0 >> 7.1 BUN/Creat 24/2.22 > 21/2.0 > 21/1.47 WBC 9.5 H/H 12/39 Plts 600 TSH 4.318 HS Trop 30 > 73  EP is asked to weigh in   He denies any CP, palpitations Denies to me any SOB (previoiusly mentioned) He does not think he passed out, though is not a great historian. He recalls EMS arriving and the entire ride and his ER arrival, he reports being awake the whole time, but does not recall being shocked. ( He was given etomidate)  He feels well currently  Past Medical History:  Diagnosis Date   AKI (acute kidney injury) ([place])    Alcohol abuse    Cocaine abuse ([place])  01/25/2021   Constipated    Diabetes mellitus without complication (HCC)    Diarrhea    Elevated LFTs    Goiter    Gout    Hyperlipidemia    Hypertension    Leukocytosis    Reactive thrombocytosis    Right BKA infection ([place]) 08/2016   Right leg pain    Sepsis due to undetermined organism [facility])    Thyroid disease    Wound infection after surgery 08/2016    Past Surgical History:  Procedure Laterality Date    ABDOMINAL AORTOGRAM N/A 08/11/2016   Procedure: Abdominal Aortogram;  Surgeon: Waynetta Sandy, MD;  Location: [place] [facility];  Service: Cardiovascular;  Laterality: N/A;   ABDOMINAL AORTOGRAM W/LOWER EXTREMITY N/A 08/15/2016   Procedure: Abdominal Aortogram w/Lower Extremity;  Surgeon: Elam Dutch, MD;  Location: [place] [facility];  Service: Cardiovascular;  Laterality: N/A;   AMPUTATION Right 08/17/2016   Procedure: RIGHT BELOW KNEE AMPUTATION;  Surgeon: Elam Dutch, MD;  Location: [place];  Service: Vascular;  Laterality: Right;   AMPUTATION Right 09/12/2016   Procedure: AMPUTATION ABOVE KNEE;  Surgeon: Newt Minion, MD;  Location: [place];  Service: Orthopedics;  Laterality: Right;   AMPUTATION Left 08/12/2016   Procedure: LEFT BELOW KNEE AMPUTATION;  Surgeon: Newt Minion, MD;  Location: [place];  Service: Orthopedics;  Laterality: Left;   AMPUTATION Left 11/01/2017   Procedure: LEFT ABOVE KNEE AMPUTATION;  Surgeon: Newt Minion, MD;  Location: [place];  Service: Orthopedics;  Laterality: Left;   APPLICATION OF WOUND VAC Right 09/12/2016   Procedure: APPLICATION OF WOUND VAC ABOVE KNEE;  Surgeon: Newt Minion, MD;  Location: [place];  Service: Orthopedics;  Laterality: Right;   BIOPSY  01/13/2021   Procedure: BIOPSY;  Surgeon: Rogene [place], MD;  [facility];  Service: Endoscopy;;  esophageal   COLONOSCOPY WITH PROPOFOL N/A 01/13/2021   Procedure: COLONOSCOPY WITH PROPOFOL;  Surgeon: Rogene [place], MD;  [facility];  Service: Endoscopy;  Laterality: N/A;   ESOPHAGOGASTRODUODENOSCOPY (EGD) WITH PROPOFOL N/A 01/13/2021   Procedure: ESOPHAGOGASTRODUODENOSCOPY (EGD) WITH PROPOFOL;  Surgeon: Rogene [place], MD;  [facility];  Service: Endoscopy;  Laterality: N/A;   LOWER EXTREMITY ANGIOGRAPHY Bilateral 08/11/2016   Procedure: Lower Extremity Angiography;  Surgeon: Waynetta Sandy, MD;  Location: [place] [facility];  Service: Cardiovascular;   Laterality: Bilateral;   PERIPHERAL VASCULAR BALLOON ANGIOPLASTY Left 08/11/2016   Procedure: Peripheral Vascular Balloon Angioplasty;  Surgeon: Waynetta Sandy, MD;  Location: [place] [facility];  Service: Cardiovascular;  Laterality: Left;  SFA   POLYPECTOMY  01/13/2021   Procedure: POLYPECTOMY;  Surgeon: Rogene [place], MD;  [facility];  Service: Endoscopy;;   THYROID SURGERY       Home Medications:  Prior to Admission medications   Medication Sig Start Date End Date Taking? Authorizing Provider  acetaminophen (TYLENOL) 325 MG tablet Take 2 tablets (650 mg total) by mouth every 6 (six) hours as needed for mild pain (or Fever >/= 101). 01/15/21  Yes Johnson, Clanford L, MD  allopurinol (ZYLOPRIM) 100 MG tablet Take 1 tablet (100 mg total) by mouth daily. 12/06/20  Yes Johnson, Clanford L, MD  apixaban (ELIQUIS) 5 MG TABS tablet Take 1 tablet (5 mg total) by mouth 2 (two) times daily. 01/16/21  Yes Johnson, Clanford L, MD  Ascorbic Acid 500 MG CAPS Take 500 mg by mouth 2 (two) times daily.  Yes [provider]  calcium carbonate (TUMS - DOSED IN MG ELEMENTAL CALCIUM) 500 MG chewable tablet Chew 1 tablet by mouth 3 (three) times daily with meals.   Yes [provider]  cetirizine (ZYRTEC) 10 MG tablet Take 1 tablet (10 mg total) by mouth daily as needed for allergies. For allergy symptoms 12/06/20  Yes Johnson, Clanford L, MD  cholecalciferol (VITAMIN D3) 25 MCG (1000 UNIT) tablet Take 1,000 Units by mouth daily.   Yes [provider]  cyanocobalamin (V-R VITAMIN B-12) 500 MCG tablet Take 1 tablet (500 mcg total) by mouth daily. 02/09/22  Yes Pennington, Rebekah M, PA-C  folic acid (FOLVITE) 1 MG tablet Take 1 mg by mouth daily.   Yes [provider]  glucose blood (ACCU-CHEK AVIVA PLUS) test strip Check blood sugars four times a day 03/12/18  Yes [name], [name], MD  hydrALAZINE (APRESOLINE) 25 MG tablet Take 1 tablet (25 mg total) by mouth 3  (three) times daily. 05/11/21  Yes Patrecia Pour, MD  insulin lispro (HUMALOG) 100 UNIT/ML KwikPen Inject 3-9 Units into the skin 3 (three) times daily. Sliding scale 90-150-3 units, 151-200- 4units, 200-250 5units, 251-300 6units, 301-350 7units, 351-400 8units,401-450 9units Patient taking differently: Inject 1-6 Units into the skin 3 (three) times daily. Sliding scale 90-150-3 units, 151-200- 1units, 200-250- 2units, 251-300 3units, 301-350 4units, 351-400 5units, over 400 give 10 units and call provider 01/11/22  Yes Brita Romp, NP  isosorbide mononitrate (IMDUR) 30 MG 24 hr tablet Take 1 tablet (30 mg total) by mouth daily. Patient taking differently: Take 30 mg by mouth daily. 2 tablets by mouth once daily 05/28/21  Yes Vann, Jessica U, DO  levothyroxine (SYNTHROID) 100 MCG tablet Take 100 mcg by mouth daily. 03/29/22  Yes [provider]  metoprolol succinate (TOPROL-XL) 100 MG 24 hr tablet Take 1 tablet (100 mg total) by mouth daily. Take with or immediately following a meal. 05/12/21  Yes Patrecia Pour, MD  mirtazapine (REMERON) 7.5 MG tablet Take 7.5 mg by mouth at bedtime. 12/23/21  Yes [provider]  Multiple Vitamin (MULTIVITAMIN WITH MINERALS) TABS tablet Take 1 tablet by mouth daily. 01/16/21  Yes Johnson, Clanford L, MD  Nutritional Supplements (NUTRITIONAL SHAKE PO) Take 237 mLs by mouth daily.   Yes [provider]  pantoprazole (PROTONIX) 40 MG tablet Take 1 tablet (40 mg total) by mouth 2 (two) times daily. Patient taking differently: Take 40 mg by mouth daily. 01/15/21  Yes Johnson, Clanford L, MD  polyethylene glycol (MIRALAX / GLYCOLAX) 17 g packet Take 17 g by mouth daily. 01/16/21  Yes Johnson, Clanford L, MD  predniSONE (STERAPRED UNI-PAK 21 TAB) 10 MG (21) TBPK tablet Take by mouth daily. Take 6 tabs by mouth daily  for 2 days, then 5 tabs for 2 days, then 4 tabs for 2 days, then 3 tabs for 2 days, 2 tabs for 2 days, then 1 tab by mouth daily for 2  days 03/29/22  Yes Godfrey Pick, MD  sertraline (ZOLOFT) 50 MG tablet Take 1 tablet (50 mg total) by mouth at bedtime. Patient taking differently: Take 50 mg by mouth daily. 12/06/20  Yes Johnson, Clanford L, MD  sodium bicarbonate 650 MG tablet Take 650 mg by mouth 3 (three) times daily.   Yes [provider]  thiamine 100 MG tablet Take 1 tablet (100 mg total) by mouth daily. 12/29/20  Yes Emokpae, Courage, MD  tiZANidine (ZANAFLEX) 2 MG tablet Take 2 mg by  mouth 3 (three) times daily. 04/26/22  Yes [provider]  amiodarone (PACERONE) 200 MG tablet  08/24/21   [provider]  atorvastatin (LIPITOR) 40 MG tablet TAKE 1 TABLET BY MOUTH ONCE DAILY AT 6 PM Patient taking differently: Take 40 mg by mouth daily. 12/06/20   Johnson, Clanford L, MD  furosemide (LASIX) 20 MG tablet Take 20 mg by mouth 2 (two) times daily. Patient not taking: Reported on 05/03/2022 08/30/21   [provider]  Glucagon, rDNA, (GLUCAGON EMERGENCY) 1 MG KIT Inject 1 mg into the muscle daily as needed (Low blood sugar). Patient not taking: Reported on 05/03/2022 10/30/21   [provider]  haloperidol (HALDOL) 5 MG tablet Take by mouth. Patient not taking: Reported on 05/03/2022 11/04/21   [provider]  HYDROcodone-acetaminophen (NORCO/VICODIN) 5-325 MG tablet Take 1 tablet by mouth every 6 (six) hours as needed for moderate pain.    [provider]  insulin glargine (LANTUS) 100 UNIT/ML Solostar Pen Inject 16 Units into the skin at bedtime. 01/11/22   Brita Romp, NP  LORazepam (ATIVAN) 0.5 MG tablet Take 1 tablet (0.5 mg total) by mouth every 6 (six) hours as needed for anxiety. Patient not taking: Reported on 05/03/2022 05/28/21   Geradine Girt, DO  megestrol (MEGACE) 400 MG/10ML suspension Take 10 mLs (400 mg total) by mouth 2 (two) times daily. For appetite stimulation 12/06/20   Johnson, Clanford L, MD  methocarbamol (ROBAXIN) 500 MG tablet Take 500 mg  by mouth 3 (three) times daily. 03/28/22   [provider]  potassium chloride SA (KLOR-CON M) 20 MEQ tablet Take 1 tablet (20 mEq total) by mouth daily. 05/29/21   Eulogio Bear U, DO  PROTEIN PO Take 30 mLs by mouth daily. Liquid protein for low albumin    [provider]  triamcinolone 0.1%-silver sulfadiazine 1:1 cream mixture Apply topically as needed.    [provider]    Inpatient Medications: Scheduled Meds:  allopurinol  100 mg Oral Daily   apixaban  5 mg Oral BID   ascorbic acid  500 mg Oral BID   atorvastatin  40 mg Oral Daily   Chlorhexidine Gluconate Cloth  6 each Topical Q0600   cholecalciferol  1,000 Units Oral Daily   cyanocobalamin  500 mcg Oral Daily   hydrALAZINE  25 mg Oral TID   insulin aspart  0-9 Units Subcutaneous TID WC   isosorbide mononitrate  60 mg Oral Daily   levothyroxine  100 mcg Oral Daily   megestrol  400 mg Oral BID   metoprolol succinate  100 mg Oral Daily   mirtazapine  7.5 mg Oral QHS   multivitamin with minerals  1 tablet Oral Daily   mupirocin ointment  1 Application Nasal BID   pantoprazole  40 mg Oral Daily   [START ON 05/05/2022] pneumococcal 20-valent conjugate vaccine  0.5 mL Intramuscular Tomorrow-1000   polyethylene glycol  17 g Oral Daily   potassium chloride SA  20 mEq Oral Daily   sertraline  50 mg Oral Daily   sodium bicarbonate  650 mg Oral TID   thiamine  100 mg Oral Daily   tiZANidine  2 mg Oral TID   Continuous Infusions:  PRN Meds: acetaminophen, mouth rinse, perflutren lipid microspheres (DEFINITY) IV suspension  Allergies:    Allergies  Allergen Reactions   Lisinopril Other (See Comments)    Hyperkalemia / Renal failure    Social History:   Social History   Socioeconomic  History   Marital status: Single    Spouse name: Not on file   Number of children: Not on file   Years of education: Not on file   Highest education level: Not on file  Occupational History   Occupation:  retired    Comment: drove a Medical illustrator  Tobacco Use   Smoking status: Every Day    Packs/day: 0.25    Years: 45.00    Total pack years: 11.25    Types: Cigarettes    Last attempt to quit: 11/12/2014    Years since quitting: 7.4    Passive exposure: Never   Smokeless tobacco: Never  Vaping Use   Vaping Use: Never used  Substance and Sexual Activity   Alcohol use: Not Currently    Alcohol/week: 2.0 standard drinks of alcohol    Types: 1 Glasses of wine, 1 Cans of beer per week   Drug use: Not Currently    Types: Cocaine   Sexual activity: Not Currently  Other Topics Concern   Not on file  Social History Narrative   09/23/20 - Lives alone, uses a wheelchair, double amputee, not married, no children - [facility] comes in 3x per week to help him bathe and set up weekly meds.   Social Determinants of Health   Financial Resource Strain: Low Risk  (09/23/2020)   Overall Financial Resource Strain (CARDIA)    Difficulty of Paying Living Expenses: Not very hard  Food Insecurity: No Food Insecurity (05/04/2022)   Hunger Vital Sign    Worried About Running Out of Food in the Last Year: Never true    Ran Out of Food in the Last Year: Never true  Transportation Needs: No Transportation Needs (05/04/2022)   PRAPARE - Hydrologist (Medical): No    Lack of Transportation (Non-Medical): No  Physical Activity: Inactive (09/23/2020)   Exercise Vital Sign    Days of Exercise per Week: 0 days    Minutes of Exercise per Session: 0 min  Stress: No Stress Concern Present (09/23/2020)   [place]    Feeling of Stress : Only a little  Social Connections: Socially Isolated (09/23/2020)   Social Connection and Isolation Panel [NHANES]    Frequency of Communication with Friends and Family: More than three times a week    Frequency of Social Gatherings with Friends and Family: More than three times a week    Attends  Religious Services: Never    Marine scientist or Organizations: No    Attends Archivist Meetings: Never    Marital Status: Never married  Intimate Partner Violence: Not At Risk (05/04/2022)   Humiliation, Afraid, Rape, and Kick questionnaire    Fear of Current or Ex-Partner: No    Emotionally Abused: No    Physically Abused: No    Sexually Abused: No    Family History:   Family History  Problem Relation Age of Onset   Heart disease Mother    Pneumonia Father    Diabetes Maternal Aunt    Diabetes Maternal Uncle      ROS:  Please see the history of present illness.  All other ROS reviewed and negative.     Physical Exam/Data:   Vitals:   05/04/22 0900 05/04/22 1000 05/04/22 1100 05/04/22 1113  BP: 133/66 (!) 161/92 (!) 168/81   Pulse: 60 65 60   Resp: _0 Temp:    97.8  F (36.6 C)  TempSrc:    Oral  SpO2: 98% 98% 97%   Weight:      Height:        Intake/Output Summary (Last 24 hours) at 05/04/2022 1139 Last data filed at 05/04/2022 1100 Gross per 24 hour  Intake 900.41 ml  Output 650 ml  Net 250.41 ml      05/03/2022   10:45 PM 05/03/2022    1:45 PM 03/29/2022    3:56 PM  Last 3 Weights  Weight (lbs) 100 lb 15.5 oz 149 lb 8 oz 97 lb  Weight (kg) 45.8 kg 67.813 kg 44 kg     Body mass index is 19.72 kg/m.  General:  Well nourished, well developed, in no acute distress HEENT: normal Neck: no JVD Vascular: No carotid bruits; Distal pulses 2+ bilaterally Cardiac:  RRR; no murmurs, gallops or rubs Lungs:  CTA b/l, no wheezing, rhonchi or rales  Abd: soft, nontender  Ext: no edema Musculoskeletal:  b/l AKA Skin: warm and dry  Neuro:  no focal abnormalities noted Psych:  Normal affect   EKG:  The EKG was personally reviewed and demonstrates:    WCT 164bpm, baseline motion WCT 168, RBBB, LAD  SR 92bpm,no ischemic looking changes  Telemetry:  Telemetry was personally reviewed and demonstrates:   SR, infrequent PVCs, no  VT  Relevant CV Studies:  07/22/21: c.MRI IMPRESSION: Mildly decreased LV function. There is multifocal and septal late gadolinium enhancement and scattered increase in ECV. Clinically less consistent with cardiac amyloidosis. In the setting of VT, consider PET assessment for Cardiac Sarcoidosis if clinically indicated.  Echo 04/20/21:  1. Left ventricular ejection fraction, by estimation, is 45 to 50%. The  left ventricle has mildly decreased function. There is moderate left  ventricular hypertrophy.   2. Right ventricular systolic function is normal. The right ventricular  size is normal.   3. Left atrial size was severely dilated.   4. Right atrial size was moderately dilated.   5. The inferior vena cava is normal in size with greater than 50%  respiratory variability, suggesting right atrial pressure of 3 mmHg.   6. Limited echo evaluate LV function.        Echo 12/27/20:  1. Left ventricular ejection fraction, by estimation, is 55 to 60%. The  left ventricle has normal function. The left ventricle has no regional  wall motion abnormalities. There is mild concentric left ventricular  hypertrophy. Left ventricular diastolic  parameters are consistent with Grade I diastolic dysfunction (impaired  relaxation).   2. Right ventricular systolic function is normal. The right ventricular  size is normal. Tricuspid regurgitation signal is inadequate for assessing  PA pressure.   3. The mitral valve is grossly normal. No evidence of mitral valve  regurgitation. No evidence of mitral stenosis.   4. The aortic valve is tricuspid. Aortic valve regurgitation is not  visualized. No aortic stenosis is present.   5. The inferior vena cava is normal in size with greater than 50%  respiratory variability, suggesting right atrial pressure of 3 mmHg.   Laboratory Data:  High Sensitivity Troponin:   Recent Labs  Lab 05/03/22 1419 05/03/22 1755  TROPONINIHS 30* 73*     Chemistry Recent  Labs  Lab 05/03/22 1419 05/03/22 1421 05/04/22 0305  NA 143 146* 141  K 3.5 3.7 4.0  CL 110 109 113*  CO2 21*  --  20*  GLUCOSE 99 98 109*  BUN 24* 21 21  CREATININE 2.22* 2.00*  1.47*  CALCIUM 7.0*  --  7.1*  MG 1.3*  --  1.9  GFRNONAA 32*  --  52*  ANIONGAP 12  --  8    No results for input(s): "PROT", "ALBUMIN", "AST", "ALT", "ALKPHOS", "BILITOT" in the last 168 hours. Lipids No results for input(s): "CHOL", "TRIG", "HDL", "LABVLDL", "LDLCALC", "CHOLHDL" in the last 168 hours.  Hematology Recent Labs  Lab 05/03/22 1419 05/03/22 1421 05/04/22 0305  WBC 9.5  --  9.7  RBC 4.71  --  4.23  HGB 12.9* 14.6 11.5*  HCT 39.5 43.0 35.5*  MCV 83.9  --  83.9  MCH 27.4  --  27.2  MCHC 32.7  --  32.4  RDW 16.0*  --  15.6*  PLT 600*  --  548*   Thyroid  Recent Labs  Lab 05/03/22 1419  TSH 4.318    BNPNo results for input(s): "BNP", "PROBNP" in the last 168 hours.  DDimer No results for input(s): "DDIMER" in the last 168 hours.   Radiology/Studies:  DG Chest Portable 1 View  Result Date: 05/03/2022 CLINICAL DATA:  Shaking episode lasting less than 5 minutes EXAM: PORTABLE CHEST 1 VIEW COMPARISON:  Portable exam 1424 hours compared to 05/27/2021 FINDINGS: External pacing leads. Upper normal heart size. Mediastinal contours and pulmonary vascularity normal. RIGHT basilar atelectasis. No pulmonary infiltrate, pleural effusion, or pneumothorax. Bones demineralized. IMPRESSION: RIGHT basilar atelectasis. Electronically Signed   By: Lavonia Dana M.D.   On: 05/03/2022 14:33     Assessment and Plan:   VT Hx of the same His creat today is unusually improved to 1.47  Perhaps a ischemic eval can be done Echo is pending Lives at SNF, tox only + for opiates (percocet [facility] med [facility] at [facility])  He had low mag and ca++ given replacement last night, deferred to attending cardiology team  Will add mexilletine for now Will need PET  Dr. [name] to see later today   Risk  Assessment/Risk Scores:     For questions or updates, please contact [facility] Please consult [email or web address] for contact info under    Signed, Baldwin Jamaica, PA-C  05/04/2022 11:39 AM

## 2022-05-05 DIAGNOSIS — Z89611 Acquired absence of right leg above knee: Secondary | ICD-10-CM | POA: Diagnosis not present

## 2022-05-05 DIAGNOSIS — I472 Ventricular tachycardia, unspecified: Secondary | ICD-10-CM | POA: Diagnosis not present

## 2022-05-05 DIAGNOSIS — N179 Acute kidney failure, unspecified: Secondary | ICD-10-CM | POA: Diagnosis not present

## 2022-05-05 DIAGNOSIS — I429 Cardiomyopathy, unspecified: Secondary | ICD-10-CM | POA: Diagnosis not present

## 2022-05-05 LAB — CBC WITH DIFFERENTIAL/PLATELET
Abs Immature Granulocytes: 0.05 10*3/uL (ref 0.00–0.07)
Basophils Absolute: 0 10*3/uL (ref 0.0–0.1)
Basophils Relative: 0 %
Eosinophils Absolute: 0.3 10*3/uL (ref 0.0–0.5)
Eosinophils Relative: 2 %
HCT: 35.4 % — ABNORMAL LOW (ref 39.0–52.0)
Hemoglobin: 11.8 g/dL — ABNORMAL LOW (ref 13.0–17.0)
Immature Granulocytes: 0 %
Lymphocytes Relative: 13 %
Lymphs Abs: 1.5 10*3/uL (ref 0.7–4.0)
MCH: 28 pg (ref 26.0–34.0)
MCHC: 33.3 g/dL (ref 30.0–36.0)
MCV: 84.1 fL (ref 80.0–100.0)
Monocytes Absolute: 0.7 10*3/uL (ref 0.1–1.0)
Monocytes Relative: 6 %
Neutro Abs: 9.6 10*3/uL — ABNORMAL HIGH (ref 1.7–7.7)
Neutrophils Relative %: 79 %
Platelets: 509 10*3/uL — ABNORMAL HIGH (ref 150–400)
RBC: 4.21 MIL/uL — ABNORMAL LOW (ref 4.22–5.81)
RDW: 15.9 % — ABNORMAL HIGH (ref 11.5–15.5)
WBC: 12.2 10*3/uL — ABNORMAL HIGH (ref 4.0–10.5)
nRBC: 0 % (ref 0.0–0.2)

## 2022-05-05 LAB — COMPREHENSIVE METABOLIC PANEL
ALT: 11 U/L (ref 0–44)
AST: 21 U/L (ref 15–41)
Albumin: 2.4 g/dL — ABNORMAL LOW (ref 3.5–5.0)
Alkaline Phosphatase: 168 U/L — ABNORMAL HIGH (ref 38–126)
Anion gap: 15 (ref 5–15)
BUN: 21 mg/dL (ref 8–23)
CO2: 21 mmol/L — ABNORMAL LOW (ref 22–32)
Calcium: 7.3 mg/dL — ABNORMAL LOW (ref 8.9–10.3)
Chloride: 104 mmol/L (ref 98–111)
Creatinine, Ser: 2.24 mg/dL — ABNORMAL HIGH (ref 0.61–1.24)
GFR, Estimated: 31 mL/min — ABNORMAL LOW (ref >60)
Glucose, Bld: 91 mg/dL (ref 70–99)
Potassium: 4.1 mmol/L (ref 3.5–5.1)
Sodium: 140 mmol/L (ref 135–145)
Total Bilirubin: 0.7 mg/dL (ref 0.3–1.2)
Total Protein: 6.7 g/dL (ref 6.5–8.1)

## 2022-05-05 LAB — GLUCOSE, CAPILLARY
Glucose-Capillary: 117 mg/dL — ABNORMAL HIGH (ref 70–99)
Glucose-Capillary: 123 mg/dL — ABNORMAL HIGH (ref 70–99)
Glucose-Capillary: 188 mg/dL — ABNORMAL HIGH (ref 70–99)
Glucose-Capillary: 219 mg/dL — ABNORMAL HIGH (ref 70–99)

## 2022-05-05 MED ORDER — SODIUM CHLORIDE 0.9% FLUSH: 3.0000 mL | INTRAVENOUS | Status: DC | PRN

## 2022-05-05 MED ORDER — SODIUM CHLORIDE 0.9 % WEIGHT BASED INFUSION
1.0000 mL/kg/h | INTRAVENOUS | Status: DC
  Administered 2022-05-06: 1 mL/kg/h via INTRAVENOUS

## 2022-05-05 MED ORDER — SODIUM CHLORIDE 0.9 % WEIGHT BASED INFUSION: 3.0000 mL/kg/h | INTRAVENOUS | Status: AC

## 2022-05-05 MED ORDER — MAGNESIUM SULFATE IN D5W 1-5 GM/100ML-% IV SOLN
1.0000 g | Freq: Once | INTRAVENOUS | Status: AC
  Administered 2022-05-05: 1 g via INTRAVENOUS
  Filled 2022-05-05: qty 100

## 2022-05-05 MED ORDER — ASPIRIN 81 MG PO CHEW
81.0000 mg | CHEWABLE_TABLET | ORAL | Status: AC
  Administered 2022-05-06: 81 mg via ORAL
  Filled 2022-05-05: qty 1

## 2022-05-05 MED ORDER — CYCLOBENZAPRINE HCL 10 MG PO TABS
10.0000 mg | ORAL_TABLET | Freq: Three times a day (TID) | ORAL | Status: DC | PRN
  Administered 2022-05-05 – 2022-05-10 (×4): 10 mg via ORAL
  Filled 2022-05-05 (×4): qty 1

## 2022-05-05 MED ORDER — SODIUM CHLORIDE 0.9 % IV SOLN: 250.0000 mL | INTRAVENOUS | Status: DC | PRN

## 2022-05-05 MED ORDER — SODIUM CHLORIDE 0.9% FLUSH
3.0000 mL | Freq: Two times a day (BID) | INTRAVENOUS | Status: DC
  Administered 2022-05-05 – 2022-05-11 (×11): 3 mL via INTRAVENOUS

## 2022-05-05 NOTE — Progress Notes (Signed)
Received call from Todd [name] sisters for an updated on his care and concerning the plan moving forward. We did discuss his back pain that they stated has been going on for over a month now as Todd [name] will call them complaining of how bad his back pain is even to the point of crying at times. They discussed wanting to get to the root cause of the pain and not only treating the symptoms of the pain. I did inform them that he had been relatively pain free this shift, but that I had placed the lidocaine patch on his back as ordered, but that he wasn't particularly in pain at that time. Upon reviewing, he did have a CT scan of his Abd completed in Oct 2023. I informed them I would pass the information on to his current nurse who could then inform the day shift RN in report. It is possible then that we can inform the Cardiology team to review the findings to see if a consult is warranted. I informed them that they can follow back up a little later today for another update.

## 2022-05-05 NOTE — Progress Notes (Signed)
Rounding Note    Patient Name: Todd [name] Date of Encounter: 05/05/2022  [place] Cardiologist: Minus Breeding, MD   Subjective   Sleeping well. Back pain controlled. No significant arrhythmia. K 4.1. Mg corrected, still a little low at 1.9 yesterday).  Inpatient Medications    Scheduled Meds:  allopurinol  100 mg Oral Daily   apixaban  5 mg Oral BID   ascorbic acid  500 mg Oral BID   atorvastatin  40 mg Oral Daily   Chlorhexidine Gluconate Cloth  6 each Topical Q0600   cholecalciferol  1,000 Units Oral Daily   cyanocobalamin  500 mcg Oral Daily   hydrALAZINE  25 mg Oral TID   insulin aspart  0-9 Units Subcutaneous TID WC   isosorbide mononitrate  60 mg Oral Daily   levothyroxine  100 mcg Oral Daily   lidocaine  1 patch Transdermal Q24H   megestrol  400 mg Oral BID   metoprolol succinate  100 mg Oral Daily   mexiletine  150 mg Oral Q8H   mirtazapine  7.5 mg Oral QHS   multivitamin with minerals  1 tablet Oral Daily   mupirocin ointment  1 Application Nasal BID   pantoprazole  40 mg Oral Daily   pneumococcal 20-valent conjugate vaccine  0.5 mL Intramuscular Tomorrow-1000   polyethylene glycol  17 g Oral Daily   potassium chloride SA  20 mEq Oral Daily   sertraline  50 mg Oral Daily   sodium bicarbonate  650 mg Oral TID   thiamine  100 mg Oral Daily   tiZANidine  2 mg Oral TID   Continuous Infusions:  PRN Meds: acetaminophen, diclofenac Sodium, mouth rinse   Vital Signs    Vitals:   05/05/22 0200 05/05/22 0300 05/05/22 0400 05/05/22 0700  BP: (!) 123/59 123/60 132/67   Pulse: (!) 55 (!) 55 61   Resp: '15 15 18   '$ Temp:    98.5 F (36.9 C)  TempSrc:    Oral  SpO2: 98% 98% 98%   Weight:      Height:        Intake/Output Summary (Last 24 hours) at 05/05/2022 0831 Last data filed at 05/05/2022 0000 Gross per 24 hour  Intake 400 ml  Output 880 ml  Net -480 ml      05/04/2022   11:00 PM 05/03/2022   10:45 PM 05/03/2022    1:45 PM   Last 3 Weights  Weight (lbs) 107 lb 9.4 oz 100 lb 15.5 oz 149 lb 8 oz  Weight (kg) 48.8 kg 45.8 kg 67.813 kg      Telemetry    NSR, occ PACs. Occ PVCs, no VT - Personally Reviewed  ECG    NSR, Q in V1-V2, diffuse horizontal 1 mm ST depression, QTc 498 ms - Personally Reviewed  Physical Exam  Appears well, comfortable GEN: No acute distress.   Neck: No JVD Cardiac: RRR, no murmurs, rubs, or gallops.  Respiratory: Clear to auscultation bilaterally. GI: Soft, nontender, non-distended  MS: bilateral AKA. Neuro:  Nonfocal  Psych: Normal affect   Labs    High Sensitivity Troponin:   Recent Labs  Lab 05/03/22 1419 05/03/22 1755  TROPONINIHS 30* 73*     Chemistry Recent Labs  Lab 05/03/22 1419 05/03/22 1421 05/04/22 0305 05/05/22 0324  NA 143 146* 141 140  K 3.5 3.7 4.0 4.1  CL 110 109 113* 104  CO2 21*  --  20* 21*  GLUCOSE 99 98 109*  91  BUN 24* '21 21 21  '$ CREATININE 2.22* 2.00* 1.47* 2.24*  CALCIUM 7.0*  --  7.1* 7.3*  MG 1.3*  --  1.9  --   PROT  --   --   --  6.7  ALBUMIN  --   --   --  2.4*  AST  --   --   --  21  ALT  --   --   --  11  ALKPHOS  --   --   --  168*  BILITOT  --   --   --  0.7  GFRNONAA 32*  --  52* 31*  ANIONGAP 12  --  8 15    Lipids No results for input(s): "CHOL", "TRIG", "HDL", "LABVLDL", "LDLCALC", "CHOLHDL" in the last 168 hours.  Hematology Recent Labs  Lab 05/03/22 1419 05/03/22 1421 05/04/22 0305 05/05/22 0324  WBC 9.5  --  9.7 12.2*  RBC 4.71  --  4.23 4.21*  HGB 12.9* 14.6 11.5* 11.8*  HCT 39.5 43.0 35.5* 35.4*  MCV 83.9  --  83.9 84.1  MCH 27.4  --  27.2 28.0  MCHC 32.7  --  32.4 33.3  RDW 16.0*  --  15.6* 15.9*  PLT 600*  --  548* 509*   Thyroid  Recent Labs  Lab 05/03/22 1419  TSH 4.318    BNPNo results for input(s): "BNP", "PROBNP" in the last 168 hours.  DDimer No results for input(s): "DDIMER" in the last 168 hours.   Radiology    ECHOCARDIOGRAM COMPLETE  Result Date: 05/04/2022    ECHOCARDIOGRAM  REPORT   Patient Name:   ROHIN KREJCI Date of Exam: 05/04/2022 Medical Rec #:  [id number]       Height:       60.0 in Accession #:    [id number]      Weight:       101.0 lb Date of Birth:  Jul 21, 1955      BSA:          1.396 m Patient Age:    49 years        BP:           133/66 mmHg Patient Gender: M               HR:           61 bpm. Exam Location:  Inpatient Procedure: 2D Echo, Cardiac Doppler, Color Doppler and Intracardiac            Opacification Agent Indications:    Ventricular Tachycardia I47.2  History:        Patient has prior history of Echocardiogram examinations, most                 recent 04/20/2021. CHF and Cardiomyopathy, PAD,                 Arrythmias:Tachycardia and Atrial Fibrillation,                 Signs/Symptoms:Shortness of Breath; Risk Factors:Hypertension,                 Diabetes, Current Smoker and Dyslipidemia.  Sonographer:    Greer Pickerel Referring Phys: [id number] FAN YE  Sonographer Comments: Image acquisition challenging due to respiratory motion and Image acquisition challenging due to patient body habitus. IMPRESSIONS  1. Left ventricular ejection fraction, by estimation, is 45 to 50%. The left ventricle has mildly decreased function. The left ventricle demonstrates global hypokinesis. There  is mild concentric left ventricular hypertrophy. Left ventricular diastolic parameters are consistent with Grade II diastolic dysfunction (pseudonormalization).  2. Right ventricular systolic function is normal. The right ventricular size is normal. Tricuspid regurgitation signal is inadequate for assessing PA pressure.  3. Left atrial size was severely dilated.  4. Right atrial size was mild to moderately dilated.  5. The mitral valve is grossly normal. No evidence of mitral valve regurgitation. No evidence of mitral stenosis.  6. The aortic valve was not well visualized. Aortic valve regurgitation is not visualized.  7. The inferior vena cava is normal in size with greater than 50%  respiratory variability, suggesting right atrial pressure of 3 mmHg. Comparison(s): No significant change from prior study. Prior images reviewed side by side. FINDINGS  Left Ventricle: Left ventricular ejection fraction, by estimation, is 45 to 50%. The left ventricle has mildly decreased function. The left ventricle demonstrates global hypokinesis. Definity contrast agent was given IV to delineate the left ventricular  endocardial borders. The left ventricular internal cavity size was normal in size. There is mild concentric left ventricular hypertrophy. Left ventricular diastolic parameters are consistent with Grade II diastolic dysfunction (pseudonormalization). Right Ventricle: The right ventricular size is normal. No increase in right ventricular wall thickness. Right ventricular systolic function is normal. Tricuspid regurgitation signal is inadequate for assessing PA pressure. Left Atrium: Left atrial size was severely dilated. Right Atrium: Right atrial size was mild to moderately dilated. Pericardium: There is no evidence of pericardial effusion. Mitral Valve: The mitral valve is grossly normal. No evidence of mitral valve regurgitation. No evidence of mitral valve stenosis. Tricuspid Valve: The tricuspid valve is normal in structure. Tricuspid valve regurgitation is not demonstrated. No evidence of tricuspid stenosis. Aortic Valve: The aortic valve was not well visualized. Aortic valve regurgitation is not visualized. Pulmonic Valve: The pulmonic valve was not well visualized. Pulmonic valve regurgitation is not visualized. Aorta: The aortic root is normal in size and structure. Venous: The inferior vena cava is normal in size with greater than 50% respiratory variability, suggesting right atrial pressure of 3 mmHg. IAS/Shunts: No atrial level shunt detected by color flow Doppler.  LEFT VENTRICLE PLAX 2D LVIDd:         5.10 cm      Diastology LVIDs:         4.30 cm      LV e' medial:    4.68 cm/s LV PW:          1.20 cm      LV E/e' medial:  12.7 LV IVS:        0.80 cm      LV e' lateral:   7.07 cm/s LVOT diam:     1.80 cm      LV E/e' lateral: 8.4 LV SV:         36 LV SV Index:   26 LVOT Area:     2.54 cm  LV Volumes (MOD) LV vol d, MOD A2C: 88.8 ml LV vol d, MOD A4C: 140.0 ml LV vol s, MOD A2C: 39.1 ml LV vol s, MOD A4C: 84.4 ml LV SV MOD A2C:     49.7 ml LV SV MOD A4C:     140.0 ml LV SV MOD BP:      58.0 ml RIGHT VENTRICLE RV S prime:     10.30 cm/s TAPSE (M-mode): 2.3 cm LEFT ATRIUM              Index  RIGHT ATRIUM           Index LA diam:        5.10 cm  3.65 cm/m   RA Area:     19.50 cm LA Vol (A2C):   118.0 ml 84.52 ml/m  RA Volume:   48.50 ml  34.74 ml/m LA Vol (A4C):   65.2 ml  46.70 ml/m LA Biplane Vol: 88.7 ml  63.54 ml/m  AORTIC VALVE LVOT Vmax:   62.10 cm/s LVOT Vmean:  40.300 cm/s LVOT VTI:    0.140 m  AORTA Ao Root diam: 3.40 cm Ao Asc diam:  2.30 cm MITRAL VALVE MV Area (PHT): 2.63 cm    SHUNTS MV Decel Time: 288 msec    Systemic VTI:  0.14 m MV E velocity: 59.30 cm/s  Systemic Diam: 1.80 cm MV A velocity: 49.50 cm/s MV E/A ratio:  1.20 Rudean Haskell MD Electronically signed by Rudean Haskell MD Signature Date/Time: 05/04/2022/12:11:00 PM    Final    DG Chest Portable 1 View  Result Date: 05/03/2022 CLINICAL DATA:  Shaking episode lasting less than 5 minutes EXAM: PORTABLE CHEST 1 VIEW COMPARISON:  Portable exam 1424 hours compared to 05/27/2021 FINDINGS: External pacing leads. Upper normal heart size. Mediastinal contours and pulmonary vascularity normal. RIGHT basilar atelectasis. No pulmonary infiltrate, pleural effusion, or pneumothorax. Bones demineralized. IMPRESSION: RIGHT basilar atelectasis. Electronically Signed   By: Lavonia Dana M.D.   On: 05/03/2022 14:33    Cardiac Studies    Cardiac MRI 07/22/2021   FINDINGS: 1. Mildly increased left ventricular size, with LVEDD 59 mm, but LVEDVi 95 mL/m2.   Moderate concentric hypertrophy, with intraventricular  septal thickness of 12 mm, posterior wall thickness of 12 mm, but with a myocardial mass index of 100 g/m2.   Anterolateral papillary muscle hypertrophy.   Normal left ventricular systolic function (LVEF =03 %). There is basal inferoseptal hypokinesis.   Left ventricular parametric mapping notable for scattered increase in the T2 signal (65%) in the basal inferoseptal and basal anterolateral.   ECV is elevated as high as 80% scattered throughout the base (anteroseptal, inferoseptal, anterolateral, inferolateral) and mid (septal, inferior, anterolateral).   There is late gadolinium enhancement in the left ventricular myocardium: Basal septal LGE in the mid myocardium, basal anterior LGE in the mid myocardium, mid anteroseptal LGE in the mid myocardium, and mid inferior LGE in the mid myocardium.   2. Normal right ventricular size with RVEDVI 73 mL/m2.   Normal right ventricular thickness.   Normal right ventricular systolic function (RVEF =49%). There are no regional wall motion abnormalities or aneurysms.   3.  Normal left and right atrial size.   4. Normal size of the aortic root, ascending aorta and pulmonary artery.   5. Valve assessment:   Aortic Valve: Tri-leaflet aortic valve. Qualitatively no significant regurgitation.   Pulmonic Valve: Qualitatively mild significant regurgitation.   Tricuspid Valve: Qualitatively no significant regurgitation.   Mitral Valve: No significant regurgitation.   6.  Normal pericardium.  No pericardial effusion.   7. Grossly, no extracardiac findings. Recommended dedicated study if concerned for non-cardiac pathology.   8. [place] Artifacts noted. Much of the study appears to be free breathing. This decreased the sensitivity of volumetric assessments. IMPRESSION: Mildly decreased LV function.   There is multifocal and septal late gadolinium enhancement and scattered increase in ECV.   Clinically less consistent with  cardiac amyloidosis.   In the setting of VT, consider PET assessment for Cardiac Sarcoidosis if clinically  indicated.       Echocardiogram 04/20/2021   1. Left ventricular ejection fraction, by estimation, is 45 to 50%. The  left ventricle has mildly decreased function. There is moderate left  ventricular hypertrophy.   2. Right ventricular systolic function is normal. The right ventricular  size is normal.   3. Left atrial size was severely dilated.   4. Right atrial size was moderately dilated.   5. The inferior vena cava is normal in size with greater than 50%  respiratory variability, suggesting right atrial pressure of 3 mmHg.   6. Limited echo evaluate LV function.    ECHO 05/03/2022    1. Left ventricular ejection fraction, by estimation, is 45 to 50%. The  left ventricle has mildly decreased function. The left ventricle  demonstrates global hypokinesis. There is mild concentric left ventricular  hypertrophy. Left ventricular diastolic  parameters are consistent with Grade II diastolic dysfunction  (pseudonormalization).   2. Right ventricular systolic function is normal. The right ventricular  size is normal. Tricuspid regurgitation signal is inadequate for assessing  PA pressure.   3. Left atrial size was severely dilated.   4. Right atrial size was mild to moderately dilated.   5. The mitral valve is grossly normal. No evidence of mitral valve  regurgitation. No evidence of mitral stenosis.   6. The aortic valve was not well visualized. Aortic valve regurgitation  is not visualized.   7. The inferior vena cava is normal in size with greater than 50%  respiratory variability, suggesting right atrial pressure of 3 mmHg.   Comparison(s): No significant change from prior study. Prior images  reviewed side by side.     Patient Profile     66 y.o. male with a PMHx of pAFib on eliquis, cardiomyopathy of uncertain etiology (LVEF 45-50% 04/2021), sustained VT, CKD  stage 3b, history of prolonged Qtc on amiodarone, wheelchair bound, PAD s/p bilateral AKA, T2DM,  peripheral arterial disease, hypertension, hyperlipidemia, chronic lower back pain and hx of polysubstance abuse, who is being seen 05/03/2022 for sustained ventricular tachycardia requiring cardioversion (hypomagnesemia at the time)  Assessment & Plan    VT: No recurrence since admission here.  On mexiletine (QT prolonged at baseline).  Previously declined for ICD due to ongoing polysubstance abuse and compliance issues.  Avoiding PO magnesium due to runny stools. Plan coronary angio tomorrow. CMP: Exact etiology uncertain (see MRI results above - gadolinium enhancement pattern does not suggest ischemic scars, raises question for sarcoidosis, PET scan had been planned at [facility], nut never completed).  Does have a history of extensive PAD with bilateral amputations so coronary disease is definitely likely.  However, the pattern of gadolinium enhancement is not consistent with ischemic cardiomyopathy.  Clinically euvolemic although echo suggests LA pressure is elevated.  Guideline directed medical therapy is limited due to his comorbid conditions. History of persistent atrial fibrillation: Currently in normal sinus rhythm. S/P bilateral above-the-knee amputation of the lower extremities: Able to get around in a wheelchair. CKD 3: yesterday's creatinine of 1.47 was an outlier and today back to his usual level of 2.2. Usually GFR in 30-35 range (31 today).   Not sure if yesterday was in error or whether he is experiencing true AKI following a prolonged VT episode with hypoperfusion. If creatinine is still 2.0-2.2, I think we can proceed with cath with limited contrast. If creatinine continues to worsen, will delay cath. Avoid NSAIDs and other nephrotoxics. Not on RAAS inhibitors. DM: Well-controlled.  Most recent hemoglobin  A1c was actually low at 5.6%. HLP: Do not have any lipid profile since April 2022 when his  total cholesterol and LDL cholesterol was very low.   CRITICAL CARE Performed by: Dani Gobble Seila Liston   Total critical care time: 30 minutes  Critical care time was exclusive of separately billable procedures and treating other patients.  Critical care was necessary to treat or prevent imminent or life-threatening deterioration.  Critical care was time spent personally by me on the following activities: development of treatment plan with patient and/or surrogate as well as nursing, discussions with consultants, evaluation of patient's response to treatment, examination of patient, obtaining history from patient or surrogate, ordering and performing treatments and interventions, ordering and review of laboratory studies, ordering and review of radiographic studies, pulse oximetry and re-evaluation of patient's condition.   For questions or updates, please contact [place] Please consult [email or web address] for contact info under        Signed, Sanda Klein, MD  05/05/2022, 8:31 AM

## 2022-05-06 ENCOUNTER — Other Ambulatory Visit (HOSPITAL_COMMUNITY): Payer: Self-pay

## 2022-05-06 DIAGNOSIS — I472 Ventricular tachycardia, unspecified: Secondary | ICD-10-CM | POA: Diagnosis not present

## 2022-05-06 LAB — LIPID PANEL
Cholesterol: 47 mg/dL (ref 0–200)
HDL: 22 mg/dL — ABNORMAL LOW (ref >40)
LDL Cholesterol: 12 mg/dL (ref 0–99)
Total CHOL/HDL Ratio: 2.1 RATIO
Triglycerides: 65 mg/dL (ref <150)
VLDL: 13 mg/dL (ref 0–40)

## 2022-05-06 LAB — GLUCOSE, CAPILLARY
Glucose-Capillary: 115 mg/dL — ABNORMAL HIGH (ref 70–99)
Glucose-Capillary: 182 mg/dL — ABNORMAL HIGH (ref 70–99)
Glucose-Capillary: 193 mg/dL — ABNORMAL HIGH (ref 70–99)
Glucose-Capillary: 226 mg/dL — ABNORMAL HIGH (ref 70–99)

## 2022-05-06 LAB — MAGNESIUM: Magnesium: 1.9 mg/dL (ref 1.7–2.4)

## 2022-05-06 LAB — BASIC METABOLIC PANEL
Anion gap: 13 (ref 5–15)
BUN: 26 mg/dL — ABNORMAL HIGH (ref 8–23)
CO2: 21 mmol/L — ABNORMAL LOW (ref 22–32)
Calcium: 7 mg/dL — ABNORMAL LOW (ref 8.9–10.3)
Chloride: 106 mmol/L (ref 98–111)
Creatinine, Ser: 2.65 mg/dL — ABNORMAL HIGH (ref 0.61–1.24)
GFR, Estimated: 26 mL/min — ABNORMAL LOW (ref >60)
Glucose, Bld: 109 mg/dL — ABNORMAL HIGH (ref 70–99)
Potassium: 4.2 mmol/L (ref 3.5–5.1)
Sodium: 140 mmol/L (ref 135–145)

## 2022-05-06 MED ORDER — ATORVASTATIN CALCIUM 10 MG PO TABS
20.0000 mg | ORAL_TABLET | Freq: Every day | ORAL | Status: DC
  Administered 2022-05-06 – 2022-05-09 (×4): 20 mg via ORAL
  Filled 2022-05-06 (×4): qty 2

## 2022-05-06 NOTE — Progress Notes (Signed)
Rounding Note    Patient Name: Todd [name] Date of Encounter: 05/06/2022  [place] Cardiologist: Minus Breeding, MD   Subjective   No CP, SOB, + hungry  Inpatient Medications    Scheduled Meds:  allopurinol  100 mg Oral Daily   ascorbic acid  500 mg Oral BID   atorvastatin  40 mg Oral Daily   Chlorhexidine Gluconate Cloth  6 each Topical Q0600   cholecalciferol  1,000 Units Oral Daily   cyanocobalamin  500 mcg Oral Daily   hydrALAZINE  25 mg Oral TID   insulin aspart  0-9 Units Subcutaneous TID WC   isosorbide mononitrate  60 mg Oral Daily   levothyroxine  100 mcg Oral Daily   lidocaine  1 patch Transdermal Q24H   megestrol  400 mg Oral BID   metoprolol succinate  100 mg Oral Daily   mexiletine  150 mg Oral Q8H   mirtazapine  7.5 mg Oral QHS   multivitamin with minerals  1 tablet Oral Daily   mupirocin ointment  1 Application Nasal BID   pantoprazole  40 mg Oral Daily   pneumococcal 20-valent conjugate vaccine  0.5 mL Intramuscular Tomorrow-1000   polyethylene glycol  17 g Oral Daily   potassium chloride SA  20 mEq Oral Daily   sertraline  50 mg Oral Daily   sodium bicarbonate  650 mg Oral TID   sodium chloride flush  3 mL Intravenous Q12H   thiamine  100 mg Oral Daily   tiZANidine  2 mg Oral TID   Continuous Infusions:  sodium chloride     sodium chloride     PRN Meds: sodium chloride, acetaminophen, cyclobenzaprine, diclofenac Sodium, mouth rinse, sodium chloride flush   Vital Signs    Vitals:   05/06/22 0100 05/06/22 0200 05/06/22 0300 05/06/22 0330  BP: 104/63 109/66 105/64   Pulse: (!) 57 (!) 57 (!) 56 (!) 59  Resp: '16 15 16 20  '$ Temp:    98.1 F (36.7 C)  TempSrc:    Oral  SpO2: 98% 97% 98% 98%  Weight:      Height:        Intake/Output Summary (Last 24 hours) at 05/06/2022 0732 Last data filed at 05/05/2022 2243 Gross per 24 hour  Intake 963 ml  Output 300 ml  Net 663 ml      05/04/2022   11:00 PM 05/03/2022   10:45  PM 05/03/2022    1:45 PM  Last 3 Weights  Weight (lbs) 107 lb 9.4 oz 100 lb 15.5 oz 149 lb 8 oz  Weight (kg) 48.8 kg 45.8 kg 67.813 kg      Telemetry    SR, one 3beat wider complex rhythm slow - Personally Reviewed  ECG    No new EKGs - Personally Reviewed  Physical Exam   GEN: No acute distress.   Neck: No JVD Cardiac: RRR, no murmurs, rubs, or gallops.  Respiratory: CTA b/l. GI: Soft, nontender, non-distended  MS: b/l AKAs. Neuro:  Nonfocal  Psych: Normal affect   Labs    High Sensitivity Troponin:   Recent Labs  Lab 05/03/22 1419 05/03/22 1755  TROPONINIHS 30* 73*     Chemistry Recent Labs  Lab 05/03/22 1419 05/03/22 1421 05/04/22 0305 05/05/22 0324 05/06/22 0416  NA 143   < > 141 140 140  K 3.5   < > 4.0 4.1 4.2  CL 110   < > 113* 104 106  CO2 21*  --  20* 21* 21*  GLUCOSE 99   < > 109* 91 109*  BUN 24*   < > 21 21 26*  CREATININE 2.22*   < > 1.47* 2.24* 2.65*  CALCIUM 7.0*  --  7.1* 7.3* 7.0*  MG 1.3*  --  1.9  --  1.9  PROT  --   --   --  6.7  --   ALBUMIN  --   --   --  2.4*  --   AST  --   --   --  21  --   ALT  --   --   --  11  --   ALKPHOS  --   --   --  168*  --   BILITOT  --   --   --  0.7  --   GFRNONAA 32*  --  52* 31* 26*  ANIONGAP 12  --  '8 15 13   '$ < > = values in this interval not displayed.    Lipids  Recent Labs  Lab 05/06/22 0416  CHOL 47  TRIG 65  HDL 22*  LDLCALC 12  CHOLHDL 2.1    Hematology Recent Labs  Lab 05/03/22 1419 05/03/22 1421 05/04/22 0305 05/05/22 0324  WBC 9.5  --  9.7 12.2*  RBC 4.71  --  4.23 4.21*  HGB 12.9* 14.6 11.5* 11.8*  HCT 39.5 43.0 35.5* 35.4*  MCV 83.9  --  83.9 84.1  MCH 27.4  --  27.2 28.0  MCHC 32.7  --  32.4 33.3  RDW 16.0*  --  15.6* 15.9*  PLT 600*  --  548* 509*   Thyroid  Recent Labs  Lab 05/03/22 1419  TSH 4.318    BNPNo results for input(s): "BNP", "PROBNP" in the last 168 hours.  DDimer No results for input(s): "DDIMER" in the last 168 hours.   Radiology       Cardiac Studies   05/04/22: TTE  1. Left ventricular ejection fraction, by estimation, is 45 to 50%. The  left ventricle has mildly decreased function. The left ventricle  demonstrates global hypokinesis. There is mild concentric left ventricular  hypertrophy. Left ventricular diastolic  parameters are consistent with Grade II diastolic dysfunction  (pseudonormalization).   2. Right ventricular systolic function is normal. The right ventricular  size is normal. Tricuspid regurgitation signal is inadequate for assessing  PA pressure.   3. Left atrial size was severely dilated.   4. Right atrial size was mild to moderately dilated.   5. The mitral valve is grossly normal. No evidence of mitral valve  regurgitation. No evidence of mitral stenosis.   6. The aortic valve was not well visualized. Aortic valve regurgitation  is not visualized.   7. The inferior vena cava is normal in size with greater than 50%  respiratory variability, suggesting right atrial pressure of 3 mmHg.   Comparison(s): No significant change from prior study. Prior images  reviewed side by side.    07/22/21: c.MRI IMPRESSION: Mildly decreased LV function. There is multifocal and septal late gadolinium enhancement and scattered increase in ECV. Clinically less consistent with cardiac amyloidosis. In the setting of VT, consider PET assessment for Cardiac Sarcoidosis if clinically indicated.   Echo 04/20/21:  1. Left ventricular ejection fraction, by estimation, is 45 to 50%. The  left ventricle has mildly decreased function. There is moderate left  ventricular hypertrophy.   2. Right ventricular systolic function is normal. The right ventricular  size is normal.  3. Left atrial size was severely dilated.   4. Right atrial size was moderately dilated.   5. The inferior vena cava is normal in size with greater than 50%  respiratory variability, suggesting right atrial pressure of 3 mmHg.   6. Limited  echo evaluate LV function.        Echo 12/27/20:  1. Left ventricular ejection fraction, by estimation, is 55 to 60%. The  left ventricle has normal function. The left ventricle has no regional  wall motion abnormalities. There is mild concentric left ventricular  hypertrophy. Left ventricular diastolic  parameters are consistent with Grade I diastolic dysfunction (impaired  relaxation).   2. Right ventricular systolic function is normal. The right ventricular  size is normal. Tricuspid regurgitation signal is inadequate for assessing  PA pressure.   3. The mitral valve is grossly normal. No evidence of mitral valve  regurgitation. No evidence of mitral stenosis.   4. The aortic valve is tricuspid. Aortic valve regurgitation is not  visualized. No aortic stenosis is present.   5. The inferior vena cava is normal in size with greater than 50%  respiratory variability, suggesting right atrial pressure of 3 mmHg.     Patient Profile     66 y.o. male with a hx of HTN, hypothyroidism, DM, AFib (described as chronic), PVD (s/p b/l AKA), polysubstance abuse (ETOH, tobacco and cocaine),  CKD (IIIb), admitted with ? Syncope, VT  Assessment & Plan    VT None here Hx of prolonged QT on amiodarone Started on mexiletine this admission  On admission mag was 1.3 Remains with hypocalcemia  Abnormal c.MRI Will need PET LVEF remains stable for him 45-50% by TTE (36% by c.MRI in Feb) Will need PET  Needs ischemic eval, doubt cath today with Creat    2. AFib (described as chronic in the chart) SR post CV CHA2DS2Vasc is 4, on Eliquis > stopped yesterday for possible cath/procedures      For questions or updates, please contact [place] Please consult [email or web address] for contact info under        Signed, Baldwin [place], PA-C  05/06/2022, 7:32 AM

## 2022-05-06 NOTE — TOC Benefit Eligibility Note (Signed)
Patient Research scientist (life sciences) completed.     The patient is currently admitted and upon discharge could be taking MEXILETINE.   The current 30 day co-pay is, $0.   The patient is insured through [employer].

## 2022-05-06 NOTE — Plan of Care (Signed)

## 2022-05-06 NOTE — Progress Notes (Signed)
Rounding Note    Patient Name: Todd [name] Date of Encounter: 05/06/2022  [facility] Cardiologist: Minus Breeding, MD   Subjective   Not have any cardiovascular plaints, including no chest pain.  No arrhythmia on telemetry. Creatinine has worsened to 2.65.  Inpatient Medications    Scheduled Meds:  allopurinol  100 mg Oral Daily   ascorbic acid  500 mg Oral BID   atorvastatin  40 mg Oral Daily   Chlorhexidine Gluconate Cloth  6 each Topical Q0600   cholecalciferol  1,000 Units Oral Daily   cyanocobalamin  500 mcg Oral Daily   hydrALAZINE  25 mg Oral TID   insulin aspart  0-9 Units Subcutaneous TID WC   isosorbide mononitrate  60 mg Oral Daily   levothyroxine  100 mcg Oral Daily   lidocaine  1 patch Transdermal Q24H   megestrol  400 mg Oral BID   metoprolol succinate  100 mg Oral Daily   mexiletine  150 mg Oral Q8H   mirtazapine  7.5 mg Oral QHS   multivitamin with minerals  1 tablet Oral Daily   mupirocin ointment  1 Application Nasal BID   pantoprazole  40 mg Oral Daily   pneumococcal 20-valent conjugate vaccine  0.5 mL Intramuscular Tomorrow-1000   polyethylene glycol  17 g Oral Daily   potassium chloride SA  20 mEq Oral Daily   sertraline  50 mg Oral Daily   sodium bicarbonate  650 mg Oral TID   sodium chloride flush  3 mL Intravenous Q12H   thiamine  100 mg Oral Daily   tiZANidine  2 mg Oral TID   Continuous Infusions:  sodium chloride     sodium chloride Stopped (05/06/22 0844)   PRN Meds: sodium chloride, acetaminophen, cyclobenzaprine, diclofenac Sodium, mouth rinse, sodium chloride flush   Vital Signs    Vitals:   05/06/22 0400 05/06/22 0500 05/06/22 0600 05/06/22 0700  BP: 108/66 107/69 115/69 114/67  Pulse: (!) 57 (!) 56 (!) 58   Resp: '15 14 15 15  '$ Temp:    98 F (36.7 C)  TempSrc:    Oral  SpO2: 99% 98% 98% 98%  Weight:      Height:        Intake/Output Summary (Last 24 hours) at 05/06/2022 0900 Last data filed at  05/05/2022 2243 Gross per 24 hour  Intake 483 ml  Output 200 ml  Net 283 ml      05/04/2022   11:00 PM 05/03/2022   10:45 PM 05/03/2022    1:45 PM  Last 3 Weights  Weight (lbs) 107 lb 9.4 oz 100 lb 15.5 oz 149 lb 8 oz  Weight (kg) 48.8 kg 45.8 kg 67.813 kg      Telemetry    Sinus rhythm, rare PVCs, a single 3 beat episode of decelerated idioventricular rhythm- Personally Reviewed  ECG    No new tracing- Personally Reviewed  Physical Exam  Appears comfortable when I enter the room.  Sleeping. GEN: No acute distress.   Neck: No JVD Cardiac: RRR, no murmurs, rubs, or gallops.  Respiratory: Clear to auscultation bilaterally. GI: Soft, nontender, non-distended  MS: No edema; status post bilateral above-the-knee amputations Neuro:  Nonfocal  Psych: Normal affect   Labs    High Sensitivity Troponin:   Recent Labs  Lab 05/03/22 1419 05/03/22 1755  TROPONINIHS 30* 73*     Chemistry Recent Labs  Lab 05/03/22 1419 05/03/22 1421 05/04/22 0305 05/05/22 0324 05/06/22 0416  NA 143   < >  141 140 140  K 3.5   < > 4.0 4.1 4.2  CL 110   < > 113* 104 106  CO2 21*  --  20* 21* 21*  GLUCOSE 99   < > 109* 91 109*  BUN 24*   < > 21 21 26*  CREATININE 2.22*   < > 1.47* 2.24* 2.65*  CALCIUM 7.0*  --  7.1* 7.3* 7.0*  MG 1.3*  --  1.9  --  1.9  PROT  --   --   --  6.7  --   ALBUMIN  --   --   --  2.4*  --   AST  --   --   --  21  --   ALT  --   --   --  11  --   ALKPHOS  --   --   --  168*  --   BILITOT  --   --   --  0.7  --   GFRNONAA 32*  --  52* 31* 26*  ANIONGAP 12  --  '8 15 13   '$ < > = values in this interval not displayed.    Lipids  Recent Labs  Lab 05/06/22 0416  CHOL 47  TRIG 65  HDL 22*  LDLCALC 12  CHOLHDL 2.1    Hematology Recent Labs  Lab 05/03/22 1419 05/03/22 1421 05/04/22 0305 05/05/22 0324  WBC 9.5  --  9.7 12.2*  RBC 4.71  --  4.23 4.21*  HGB 12.9* 14.6 11.5* 11.8*  HCT 39.5 43.0 35.5* 35.4*  MCV 83.9  --  83.9 84.1  MCH 27.4  --   27.2 28.0  MCHC 32.7  --  32.4 33.3  RDW 16.0*  --  15.6* 15.9*  PLT 600*  --  548* 509*   Thyroid  Recent Labs  Lab 05/03/22 1419  TSH 4.318    BNPNo results for input(s): "BNP", "PROBNP" in the last 168 hours.  DDimer No results for input(s): "DDIMER" in the last 168 hours.   Radiology    ECHOCARDIOGRAM COMPLETE  Result Date: 05/04/2022    ECHOCARDIOGRAM REPORT   Patient Name:   [name] Date of Exam: 05/04/2022 Medical Rec #:  [id number]       Height:       60.0 in Accession #:    [id number]      Weight:       101.0 lb Date of Birth:  Jun 04, 1956      BSA:          1.396 m Patient Age:    66 years        BP:           133/66 mmHg Patient Gender: M               HR:           61 bpm. Exam Location:  Inpatient Procedure: 2D Echo, Cardiac Doppler, Color Doppler and Intracardiac            Opacification Agent Indications:    Ventricular Tachycardia I47.2  History:        Patient has prior history of Echocardiogram examinations, most                 recent 04/20/2021. CHF and Cardiomyopathy, PAD,                 Arrythmias:Tachycardia and Atrial Fibrillation,  Signs/Symptoms:Shortness of Breath; Risk Factors:Hypertension,                 Diabetes, Current Smoker and Dyslipidemia.  Sonographer:    Greer Pickerel Referring Phys: [id number] FAN YE  Sonographer Comments: Image acquisition challenging due to respiratory motion and Image acquisition challenging due to patient body habitus. IMPRESSIONS  1. Left ventricular ejection fraction, by estimation, is 45 to 50%. The left ventricle has mildly decreased function. The left ventricle demonstrates global hypokinesis. There is mild concentric left ventricular hypertrophy. Left ventricular diastolic parameters are consistent with Grade II diastolic dysfunction (pseudonormalization).  2. Right ventricular systolic function is normal. The right ventricular size is normal. Tricuspid regurgitation signal is inadequate for assessing PA pressure.   3. Left atrial size was severely dilated.  4. Right atrial size was mild to moderately dilated.  5. The mitral valve is grossly normal. No evidence of mitral valve regurgitation. No evidence of mitral stenosis.  6. The aortic valve was not well visualized. Aortic valve regurgitation is not visualized.  7. The inferior vena cava is normal in size with greater than 50% respiratory variability, suggesting right atrial pressure of 3 mmHg. Comparison(s): No significant change from prior study. Prior images reviewed side by side. FINDINGS  Left Ventricle: Left ventricular ejection fraction, by estimation, is 45 to 50%. The left ventricle has mildly decreased function. The left ventricle demonstrates global hypokinesis. Definity contrast agent was given IV to delineate the left ventricular  endocardial borders. The left ventricular internal cavity size was normal in size. There is mild concentric left ventricular hypertrophy. Left ventricular diastolic parameters are consistent with Grade II diastolic dysfunction (pseudonormalization). Right Ventricle: The right ventricular size is normal. No increase in right ventricular wall thickness. Right ventricular systolic function is normal. Tricuspid regurgitation signal is inadequate for assessing PA pressure. Left Atrium: Left atrial size was severely dilated. Right Atrium: Right atrial size was mild to moderately dilated. Pericardium: There is no evidence of pericardial effusion. Mitral Valve: The mitral valve is grossly normal. No evidence of mitral valve regurgitation. No evidence of mitral valve stenosis. Tricuspid Valve: The tricuspid valve is normal in structure. Tricuspid valve regurgitation is not demonstrated. No evidence of tricuspid stenosis. Aortic Valve: The aortic valve was not well visualized. Aortic valve regurgitation is not visualized. Pulmonic Valve: The pulmonic valve was not well visualized. Pulmonic valve regurgitation is not visualized. Aorta: The aortic  root is normal in size and structure. Venous: The inferior vena cava is normal in size with greater than 50% respiratory variability, suggesting right atrial pressure of 3 mmHg. IAS/Shunts: No atrial level shunt detected by color flow Doppler.  LEFT VENTRICLE PLAX 2D LVIDd:         5.10 cm      Diastology LVIDs:         4.30 cm      LV e' medial:    4.68 cm/s LV PW:         1.20 cm      LV E/e' medial:  12.7 LV IVS:        0.80 cm      LV e' lateral:   7.07 cm/s LVOT diam:     1.80 cm      LV E/e' lateral: 8.4 LV SV:         36 LV SV Index:   26 LVOT Area:     2.54 cm  LV Volumes (MOD) LV vol d, MOD A2C: 88.8 ml LV vol d,  MOD A4C: 140.0 ml LV vol s, MOD A2C: 39.1 ml LV vol s, MOD A4C: 84.4 ml LV SV MOD A2C:     49.7 ml LV SV MOD A4C:     140.0 ml LV SV MOD BP:      58.0 ml RIGHT VENTRICLE RV S prime:     10.30 cm/s TAPSE (M-mode): 2.3 cm LEFT ATRIUM              Index        RIGHT ATRIUM           Index LA diam:        5.10 cm  3.65 cm/m   RA Area:     19.50 cm LA Vol (A2C):   118.0 ml 84.52 ml/m  RA Volume:   48.50 ml  34.74 ml/m LA Vol (A4C):   65.2 ml  46.70 ml/m LA Biplane Vol: 88.7 ml  63.54 ml/m  AORTIC VALVE LVOT Vmax:   62.10 cm/s LVOT Vmean:  40.300 cm/s LVOT VTI:    0.140 m  AORTA Ao Root diam: 3.40 cm Ao Asc diam:  2.30 cm MITRAL VALVE MV Area (PHT): 2.63 cm    SHUNTS MV Decel Time: 288 msec    Systemic VTI:  0.14 m MV E velocity: 59.30 cm/s  Systemic Diam: 1.80 cm MV A velocity: 49.50 cm/s MV E/A ratio:  1.20 Rudean Haskell MD Electronically signed by Rudean Haskell MD Signature Date/Time: 05/04/2022/12:11:00 PM    Final     Cardiac Studies    Cardiac MRI 07/22/2021   FINDINGS: 1. Mildly increased left ventricular size, with LVEDD 59 mm, but LVEDVi 95 mL/m2.   Moderate concentric hypertrophy, with intraventricular septal thickness of 12 mm, posterior wall thickness of 12 mm, but with a myocardial mass index of 100 g/m2.   Anterolateral papillary muscle hypertrophy.    Normal left ventricular systolic function (LVEF =34 %). There is basal inferoseptal hypokinesis.   Left ventricular parametric mapping notable for scattered increase in the T2 signal (65%) in the basal inferoseptal and basal anterolateral.   ECV is elevated as high as 80% scattered throughout the base (anteroseptal, inferoseptal, anterolateral, inferolateral) and mid (septal, inferior, anterolateral).   There is late gadolinium enhancement in the left ventricular myocardium: Basal septal LGE in the mid myocardium, basal anterior LGE in the mid myocardium, mid anteroseptal LGE in the mid myocardium, and mid inferior LGE in the mid myocardium.   2. Normal right ventricular size with RVEDVI 73 mL/m2.   Normal right ventricular thickness.   Normal right ventricular systolic function (RVEF =19%). There are no regional wall motion abnormalities or aneurysms.   3.  Normal left and right atrial size.   4. Normal size of the aortic root, ascending aorta and pulmonary artery.   5. Valve assessment:   Aortic Valve: Tri-leaflet aortic valve. Qualitatively no significant regurgitation.   Pulmonic Valve: Qualitatively mild significant regurgitation.   Tricuspid Valve: Qualitatively no significant regurgitation.   Mitral Valve: No significant regurgitation.   6.  Normal pericardium.  No pericardial effusion.   7. Grossly, no extracardiac findings. Recommended dedicated study if concerned for non-cardiac pathology.   8. Breath[place] Artifacts noted. Much of the study appears to be free breathing. This decreased the sensitivity of volumetric assessments. IMPRESSION: Mildly decreased LV function.   There is multifocal and septal late gadolinium enhancement and scattered increase in ECV.   Clinically less consistent with cardiac amyloidosis.   In the setting of VT, consider  PET assessment for Cardiac Sarcoidosis if clinically indicated.       Echocardiogram 04/20/2021   1.  Left ventricular ejection fraction, by estimation, is 45 to 50%. The  left ventricle has mildly decreased function. There is moderate left  ventricular hypertrophy.   2. Right ventricular systolic function is normal. The right ventricular  size is normal.   3. Left atrial size was severely dilated.   4. Right atrial size was moderately dilated.   5. The inferior vena cava is normal in size with greater than 50%  respiratory variability, suggesting right atrial pressure of 3 mmHg.   6. Limited echo evaluate LV function.      ECHO 05/03/2022     1. Left ventricular ejection fraction, by estimation, is 45 to 50%. The  left ventricle has mildly decreased function. The left ventricle  demonstrates global hypokinesis. There is mild concentric left ventricular  hypertrophy. Left ventricular diastolic  parameters are consistent with Grade II diastolic dysfunction  (pseudonormalization).   2. Right ventricular systolic function is normal. The right ventricular  size is normal. Tricuspid regurgitation signal is inadequate for assessing  PA pressure.   3. Left atrial size was severely dilated.   4. Right atrial size was mild to moderately dilated.   5. The mitral valve is grossly normal. No evidence of mitral valve  regurgitation. No evidence of mitral stenosis.   6. The aortic valve was not well visualized. Aortic valve regurgitation  is not visualized.   7. The inferior vena cava is normal in size with greater than 50%  respiratory variability, suggesting right atrial pressure of 3 mmHg.   Comparison(s): No significant change from prior study. Prior images  reviewed side by side.     Patient Profile     66 y.o. male with a PMHx of pAFib on eliquis, cardiomyopathy of uncertain etiology (LVEF 45-50% 04/2022; 36% by cardiac MRI), recurrent problems with sustained VT, CKD stage 3b, history of prolonged Qtc on amiodarone, wheelchair bound, PAD s/p bilateral AKA, T2DM,  peripheral arterial  disease, hypertension, hyperlipidemia, chronic lower back pain and hx of polysubstance abuse, who is being seen 05/03/2022 for sustained ventricular tachycardia requiring cardioversion (hypomagnesemia at the time)   Assessment & Plan    VT: No recurrence of ventricular tachycardia.  Potassium 4.2 and magnesium 1.9.  Avoiding oral magnesium supplements due to loose stools.  Tolerating mexiletine. CMP: Etiology uncertain.  Very high risk of having coronary artery disease with his extensive history of PAD, but the pattern of gadolinium enhancement on MRI was more consistent with sarcoidosis rather than coronary insufficiency.  Plans for coronary angiography today on hold due to worsening renal function. CHF: Clinically euvolemic.  Has received some IV fluids this morning in anticipation of cardiac cath.  Has no trouble with shortness of breath.  MRI estimation of LVEF is substantially lower than that on echocardiograms performed twice this year.  Guideline directed medical therapy for heart failure is limited by advanced kidney disease and other comorbidities. DM: Well-controlled HLP: Very low cholesterol with LDL of 12, also noted albumin 2.4-2.9 over the last 3 months.  Bilirubin and transaminases are normal and he does not look malnourished.  Still wonder whether he may have developed some liver problems.  Will reduce the dose of atorvastatin. PAD: Status post bilateral AKA. AKI on CKD3B: Usual creatinine is in the 2.0-2.2 range, although it was remarkably good at about 1.4 on admission.  Steadily worsening since admission possibly due to transient reduction  in cardiac output from protracted sustained VT.  Transfer to progressive care.  Tentatively, rescheduled for cardiac catheterization on Monday, 05/09/2022  For questions or updates, please contact [place] Please consult [email or web address] for contact info under        Signed, Sanda Klein, MD  05/06/2022, 9:00 AM

## 2022-05-07 DIAGNOSIS — I472 Ventricular tachycardia, unspecified: Secondary | ICD-10-CM | POA: Diagnosis not present

## 2022-05-07 LAB — BASIC METABOLIC PANEL
Anion gap: 10 (ref 5–15)
BUN: 30 mg/dL — ABNORMAL HIGH (ref 8–23)
CO2: 20 mmol/L — ABNORMAL LOW (ref 22–32)
Calcium: 6.9 mg/dL — ABNORMAL LOW (ref 8.9–10.3)
Chloride: 108 mmol/L (ref 98–111)
Creatinine, Ser: 2.43 mg/dL — ABNORMAL HIGH (ref 0.61–1.24)
GFR, Estimated: 28 mL/min — ABNORMAL LOW (ref >60)
Glucose, Bld: 192 mg/dL — ABNORMAL HIGH (ref 70–99)
Potassium: 4.6 mmol/L (ref 3.5–5.1)
Sodium: 138 mmol/L (ref 135–145)

## 2022-05-07 LAB — GLUCOSE, CAPILLARY
Glucose-Capillary: 180 mg/dL — ABNORMAL HIGH (ref 70–99)
Glucose-Capillary: 148 mg/dL — ABNORMAL HIGH (ref 70–99)
Glucose-Capillary: 285 mg/dL — ABNORMAL HIGH (ref 70–99)
Glucose-Capillary: 269 mg/dL — ABNORMAL HIGH (ref 70–99)

## 2022-05-07 NOTE — Progress Notes (Signed)
05/06/2022 2230 Received pt to room 4E-16 from [facility].  Pt is A&Ox3-? Situation.  C/O of lower/mid back pain.  Note lidocaine patch to mid back.  Tele monitor applied and CCMD notified.  CHG bath given.  Oriented to room, call light and bed.  Call bell in reach.   Carney [place]

## 2022-05-07 NOTE — Progress Notes (Signed)
Rounding Note    Patient Name: Todd [name] Date of Encounter: 05/07/2022  [place] Cardiologist: Minus Breeding, MD   Subjective   Low back pain less severe today with Flexeril and lidocaine patch.  Creatinine shows a slight improvement.  Inpatient Medications    Scheduled Meds:  allopurinol  100 mg Oral Daily   ascorbic acid  500 mg Oral BID   atorvastatin  20 mg Oral Daily   Chlorhexidine Gluconate Cloth  6 each Topical Q0600   cholecalciferol  1,000 Units Oral Daily   cyanocobalamin  500 mcg Oral Daily   insulin aspart  0-9 Units Subcutaneous TID WC   isosorbide mononitrate  60 mg Oral Daily   levothyroxine  100 mcg Oral Daily   lidocaine  1 patch Transdermal Q24H   megestrol  400 mg Oral BID   metoprolol succinate  100 mg Oral Daily   mexiletine  150 mg Oral Q8H   mirtazapine  7.5 mg Oral QHS   multivitamin with minerals  1 tablet Oral Daily   mupirocin ointment  1 Application Nasal BID   pantoprazole  40 mg Oral Daily   pneumococcal 20-valent conjugate vaccine  0.5 mL Intramuscular Tomorrow-1000   polyethylene glycol  17 g Oral Daily   potassium chloride SA  20 mEq Oral Daily   sertraline  50 mg Oral Daily   sodium bicarbonate  650 mg Oral TID   sodium chloride flush  3 mL Intravenous Q12H   thiamine  100 mg Oral Daily   tiZANidine  2 mg Oral TID   Continuous Infusions:  PRN Meds: acetaminophen, cyclobenzaprine, diclofenac Sodium, mouth rinse   Vital Signs    Vitals:   05/06/22 2238 05/07/22 0340 05/07/22 0858 05/07/22 0935  BP: (!) 156/82 (!) 146/77 (!) 155/77   Pulse: 63 65 60 62  Resp: '20 16 18   '$ Temp: 98.5 F (36.9 C) 97.8 F (36.6 C) 98.7 F (37.1 C)   TempSrc: Oral Oral Oral   SpO2: 100% 100% 99%   Weight:      Height:        Intake/Output Summary (Last 24 hours) at 05/07/2022 1102 Last data filed at 05/07/2022 0859 Gross per 24 hour  Intake 287.99 ml  Output 925 ml  Net -637.01 ml      05/04/2022   11:00 PM  05/03/2022   10:45 PM 05/03/2022    1:45 PM  Last 3 Weights  Weight (lbs) 107 lb 9.4 oz 100 lb 15.5 oz 149 lb 8 oz  Weight (kg) 48.8 kg 45.8 kg 67.813 kg      Telemetry    Normal sinus rhythm- Personally Reviewed  ECG    No new tracing- Personally Reviewed  Physical Exam   GEN: No acute distress.   Neck: No JVD Cardiac: RRR, no murmurs, rubs, or gallops.  Respiratory: Clear to auscultation bilaterally. GI: Soft, nontender, non-distended  MS: No edema; bilateral above-the-knee amputations Neuro:  Nonfocal  Psych: Normal affect   Labs    High Sensitivity Troponin:   Recent Labs  Lab 05/03/22 1419 05/03/22 1755  TROPONINIHS 30* 73*     Chemistry Recent Labs  Lab 05/03/22 1419 05/03/22 1421 05/04/22 0305 05/05/22 0324 05/06/22 0416 05/07/22 0101  NA 143   < > 141 140 140 138  K 3.5   < > 4.0 4.1 4.2 4.6  CL 110   < > 113* 104 106 108  CO2 21*  --  20* 21* 21* 20*  GLUCOSE 99   < > 109* 91 109* 192*  BUN 24*   < > 21 21 26* 30*  CREATININE 2.22*   < > 1.47* 2.24* 2.65* 2.43*  CALCIUM 7.0*  --  7.1* 7.3* 7.0* 6.9*  MG 1.3*  --  1.9  --  1.9  --   PROT  --   --   --  6.7  --   --   ALBUMIN  --   --   --  2.4*  --   --   AST  --   --   --  21  --   --   ALT  --   --   --  11  --   --   ALKPHOS  --   --   --  168*  --   --   BILITOT  --   --   --  0.7  --   --   GFRNONAA 32*  --  52* 31* 26* 28*  ANIONGAP 12  --  '8 15 13 10   '$ < > = values in this interval not displayed.    Lipids  Recent Labs  Lab 05/06/22 0416  CHOL 47  TRIG 65  HDL 22*  LDLCALC 12  CHOLHDL 2.1    Hematology Recent Labs  Lab 05/03/22 1419 05/03/22 1421 05/04/22 0305 05/05/22 0324  WBC 9.5  --  9.7 12.2*  RBC 4.71  --  4.23 4.21*  HGB 12.9* 14.6 11.5* 11.8*  HCT 39.5 43.0 35.5* 35.4*  MCV 83.9  --  83.9 84.1  MCH 27.4  --  27.2 28.0  MCHC 32.7  --  32.4 33.3  RDW 16.0*  --  15.6* 15.9*  PLT 600*  --  548* 509*   Thyroid  Recent Labs  Lab 05/03/22 1419  TSH 4.318     BNPNo results for input(s): "BNP", "PROBNP" in the last 168 hours.  DDimer No results for input(s): "DDIMER" in the last 168 hours.   Radiology    No results found.  Cardiac Studies    Cardiac MRI 07/22/2021   FINDINGS: 1. Mildly increased left ventricular size, with LVEDD 59 mm, but LVEDVi 95 mL/m2.   Moderate concentric hypertrophy, with intraventricular septal thickness of 12 mm, posterior wall thickness of 12 mm, but with a myocardial mass index of 100 g/m2.   Anterolateral papillary muscle hypertrophy.   Normal left ventricular systolic function (LVEF =61 %). There is basal inferoseptal hypokinesis.   Left ventricular parametric mapping notable for scattered increase in the T2 signal (65%) in the basal inferoseptal and basal anterolateral.   ECV is elevated as high as 80% scattered throughout the base (anteroseptal, inferoseptal, anterolateral, inferolateral) and mid (septal, inferior, anterolateral).   There is late gadolinium enhancement in the left ventricular myocardium: Basal septal LGE in the mid myocardium, basal anterior LGE in the mid myocardium, mid anteroseptal LGE in the mid myocardium, and mid inferior LGE in the mid myocardium.   2. Normal right ventricular size with RVEDVI 73 mL/m2.   Normal right ventricular thickness.   Normal right ventricular systolic function (RVEF =60%). There are no regional wall motion abnormalities or aneurysms.   3.  Normal left and right atrial size.   4. Normal size of the aortic root, ascending aorta and pulmonary artery.   5. Valve assessment:   Aortic Valve: Tri-leaflet aortic valve. Qualitatively no significant regurgitation.   Pulmonic Valve: Qualitatively mild significant regurgitation.   Tricuspid Valve:  Qualitatively no significant regurgitation.   Mitral Valve: No significant regurgitation.   6.  Normal pericardium.  No pericardial effusion.   7. Grossly, no extracardiac findings. Recommended  dedicated study if concerned for non-cardiac pathology.   8. Breathhold Artifacts noted. Much of the study appears to be free breathing. This decreased the sensitivity of volumetric assessments. IMPRESSION: Mildly decreased LV function.   There is multifocal and septal late gadolinium enhancement and scattered increase in ECV.   Clinically less consistent with cardiac amyloidosis.   In the setting of VT, consider PET assessment for Cardiac Sarcoidosis if clinically indicated.       Echocardiogram 04/20/2021   1. Left ventricular ejection fraction, by estimation, is 45 to 50%. The  left ventricle has mildly decreased function. There is moderate left  ventricular hypertrophy.   2. Right ventricular systolic function is normal. The right ventricular  size is normal.   3. Left atrial size was severely dilated.   4. Right atrial size was moderately dilated.   5. The inferior vena cava is normal in size with greater than 50%  respiratory variability, suggesting right atrial pressure of 3 mmHg.   6. Limited echo evaluate LV function.      ECHO 05/03/2022     1. Left ventricular ejection fraction, by estimation, is 45 to 50%. The  left ventricle has mildly decreased function. The left ventricle  demonstrates global hypokinesis. There is mild concentric left ventricular  hypertrophy. Left ventricular diastolic  parameters are consistent with Grade II diastolic dysfunction  (pseudonormalization).   2. Right ventricular systolic function is normal. The right ventricular  size is normal. Tricuspid regurgitation signal is inadequate for assessing  PA pressure.   3. Left atrial size was severely dilated.   4. Right atrial size was mild to moderately dilated.   5. The mitral valve is grossly normal. No evidence of mitral valve  regurgitation. No evidence of mitral stenosis.   6. The aortic valve was not well visualized. Aortic valve regurgitation  is not visualized.   7. The  inferior vena cava is normal in size with greater than 50%  respiratory variability, suggesting right atrial pressure of 3 mmHg.   Comparison(s): No significant change from prior study. Prior images  reviewed side by side.     Patient Profile     66 y.o. male  with a PMHx of pAFib on eliquis, cardiomyopathy of uncertain etiology (LVEF 45-50% 04/2022; 36% by cardiac MRI), recurrent problems with sustained VT, CKD stage 3b, history of prolonged Qtc on amiodarone, wheelchair bound, PAD s/p bilateral AKA, T2DM,  peripheral arterial disease, hypertension, hyperlipidemia, chronic lower back pain and hx of polysubstance abuse, who is being seen 05/03/2022 for sustained ventricular tachycardia requiring cardioversion (hypomagnesemia at the time)     Assessment & Plan  VT : No recurrence.  Monitor electrolytes.  Avoiding oral magnesium due to loose stools.  Tolerating mexiletine. CMP: Etiology uncertain.  Very high risk of having coronary artery disease with his extensive history of PAD, but the pattern of gadolinium enhancement on MRI was more consistent with sarcoidosis rather than coronary insufficiency.  Plans for coronary angiography today on hold due to worsening renal function. CHF: Clinically euvolemic.  Guideline directed medical therapy for heart failure as limited by advanced chronic kidney disease with acute exacerbation. DM: Well-controlled HLP: Very low cholesterol with LDL of 12, also noted albumin 2.4-2.9 over the last 3 months.  Bilirubin and transaminases are normal and he does not look malnourished.  Still wonder whether he may have developed some liver problems.  Have reduced the dose of atorvastatin. PAD: Status post bilateral AKA. AKI on CKD3B: Usual creatinine is in the 2.0-2.2 range, although it was remarkably good at about 1.4 on admission (question error).  Steadily worsening since admission possibly due to transient reduction in cardiac output from protracted sustained VT. slight  improvement in creatinine today compared to yesterday. Chronic low back pain: Although he does have some marked degenerative changes, no major abnormalities on CT lumbar spine.  Continue mexiletine.  Continue to monitor renal function.  Tentatively scheduled for left heart catheterization and coronary angiography Monday at noon, if renal function allows [place] his previous (baseline creatinine 2.0-2.2).  For questions or updates, please contact [place] Please consult [email or web address] for contact info under        Signed, Sanda Klein, MD  05/07/2022, 11:02 AM

## 2022-05-08 DIAGNOSIS — I472 Ventricular tachycardia, unspecified: Secondary | ICD-10-CM | POA: Diagnosis not present

## 2022-05-08 LAB — BASIC METABOLIC PANEL
Anion gap: 9 (ref 5–15)
BUN: 29 mg/dL — ABNORMAL HIGH (ref 8–23)
CO2: 18 mmol/L — ABNORMAL LOW (ref 22–32)
Calcium: 7.2 mg/dL — ABNORMAL LOW (ref 8.9–10.3)
Chloride: 111 mmol/L (ref 98–111)
Creatinine, Ser: 2.46 mg/dL — ABNORMAL HIGH (ref 0.61–1.24)
GFR, Estimated: 28 mL/min — ABNORMAL LOW (ref >60)
Glucose, Bld: 319 mg/dL — ABNORMAL HIGH (ref 70–99)
Potassium: 4.4 mmol/L (ref 3.5–5.1)
Sodium: 138 mmol/L (ref 135–145)

## 2022-05-08 LAB — GLUCOSE, CAPILLARY
Glucose-Capillary: 332 mg/dL — ABNORMAL HIGH (ref 70–99)
Glucose-Capillary: 211 mg/dL — ABNORMAL HIGH (ref 70–99)
Glucose-Capillary: 225 mg/dL — ABNORMAL HIGH (ref 70–99)
Glucose-Capillary: 295 mg/dL — ABNORMAL HIGH (ref 70–99)

## 2022-05-08 LAB — MAGNESIUM: Magnesium: 1.9 mg/dL (ref 1.7–2.4)

## 2022-05-08 MED ORDER — SODIUM CHLORIDE 0.9% FLUSH: 3.0000 mL | Freq: Two times a day (BID) | INTRAVENOUS | Status: DC

## 2022-05-08 MED ORDER — SODIUM CHLORIDE 0.9 % IV SOLN: INTRAVENOUS | Status: DC

## 2022-05-08 MED ORDER — SODIUM CHLORIDE 0.9 % IV SOLN: 250.0000 mL | INTRAVENOUS | Status: DC | PRN

## 2022-05-08 MED ORDER — ASPIRIN 81 MG PO CHEW
81.0000 mg | CHEWABLE_TABLET | ORAL | Status: AC
  Administered 2022-05-09: 81 mg via ORAL
  Filled 2022-05-08: qty 1

## 2022-05-08 MED ORDER — SODIUM CHLORIDE 0.9% FLUSH: 3.0000 mL | INTRAVENOUS | Status: DC | PRN

## 2022-05-08 NOTE — Progress Notes (Addendum)
Rounding Note    Patient Name: Todd [name] Date of Encounter: 05/08/2022  [place] Cardiologist: Minus Breeding, MD   Subjective   No arrhythmia. No angina. Creatinine is unchanged.  Inpatient Medications    Scheduled Meds:  allopurinol  100 mg Oral Daily   ascorbic acid  500 mg Oral BID   atorvastatin  20 mg Oral Daily   Chlorhexidine Gluconate Cloth  6 each Topical Q0600   cholecalciferol  1,000 Units Oral Daily   cyanocobalamin  500 mcg Oral Daily   insulin aspart  0-9 Units Subcutaneous TID WC   isosorbide mononitrate  60 mg Oral Daily   levothyroxine  100 mcg Oral Daily   lidocaine  1 patch Transdermal Q24H   megestrol  400 mg Oral BID   metoprolol succinate  100 mg Oral Daily   mexiletine  150 mg Oral Q8H   mirtazapine  7.5 mg Oral QHS   multivitamin with minerals  1 tablet Oral Daily   mupirocin ointment  1 Application Nasal BID   pantoprazole  40 mg Oral Daily   pneumococcal 20-valent conjugate vaccine  0.5 mL Intramuscular Tomorrow-1000   polyethylene glycol  17 g Oral Daily   potassium chloride SA  20 mEq Oral Daily   sertraline  50 mg Oral Daily   sodium bicarbonate  650 mg Oral TID   sodium chloride flush  3 mL Intravenous Q12H   thiamine  100 mg Oral Daily   tiZANidine  2 mg Oral TID   Continuous Infusions:  sodium chloride     PRN Meds: acetaminophen, cyclobenzaprine, diclofenac Sodium, mouth rinse   Vital Signs    Vitals:   05/07/22 1936 05/07/22 2318 05/08/22 0431 05/08/22 0857  BP: (!) 148/99 (!) 165/69 (!) 170/76 (!) 159/78  Pulse: 68 65 61 67  Resp: '20 16 19 20  '$ Temp: 98.6 F (37 C) 98.9 F (37.2 C) 98 F (36.7 C) 98.7 F (37.1 C)  TempSrc: Oral Oral Oral Oral  SpO2: 99% 99% 98% 98%  Weight:      Height:        Intake/Output Summary (Last 24 hours) at 05/08/2022 1026 Last data filed at 05/08/2022 0911 Gross per 24 hour  Intake 240 ml  Output 1900 ml  Net -1660 ml      05/04/2022   11:00 PM 05/03/2022    10:45 PM 05/03/2022    1:45 PM  Last 3 Weights  Weight (lbs) 107 lb 9.4 oz 100 lb 15.5 oz 149 lb 8 oz  Weight (kg) 48.8 kg 45.8 kg 67.813 kg      Telemetry    NSR - Personally Reviewed  ECG    No new tracing - Personally Reviewed  Physical Exam  comfortable GEN: No acute distress.   Neck: No JVD Cardiac: RRR, no murmurs, rubs, or gallops.  Respiratory: Clear to auscultation bilaterally. GI: Soft, nontender, non-distended  MS: No edema; No deformity. Neuro:  Nonfocal  Psych: Normal affect   Labs    High Sensitivity Troponin:   Recent Labs  Lab 05/03/22 1419 05/03/22 1755  TROPONINIHS 30* 73*     Chemistry Recent Labs  Lab 05/04/22 0305 05/05/22 0324 05/06/22 0416 05/07/22 0101 05/08/22 0117  NA 141 140 140 138 138  K 4.0 4.1 4.2 4.6 4.4  CL 113* 104 106 108 111  CO2 20* 21* 21* 20* 18*  GLUCOSE 109* 91 109* 192* 319*  BUN 21 21 26* 30* 29*  CREATININE 1.47* 2.24*  2.65* 2.43* 2.46*  CALCIUM 7.1* 7.3* 7.0* 6.9* 7.2*  MG 1.9  --  1.9  --  1.9  PROT  --  6.7  --   --   --   ALBUMIN  --  2.4*  --   --   --   AST  --  21  --   --   --   ALT  --  11  --   --   --   ALKPHOS  --  168*  --   --   --   BILITOT  --  0.7  --   --   --   GFRNONAA 52* 31* 26* 28* 28*  ANIONGAP '8 15 13 10 9    '$ Lipids  Recent Labs  Lab 05/06/22 0416  CHOL 47  TRIG 65  HDL 22*  LDLCALC 12  CHOLHDL 2.1    Hematology Recent Labs  Lab 05/03/22 1419 05/03/22 1421 05/04/22 0305 05/05/22 0324  WBC 9.5  --  9.7 12.2*  RBC 4.71  --  4.23 4.21*  HGB 12.9* 14.6 11.5* 11.8*  HCT 39.5 43.0 35.5* 35.4*  MCV 83.9  --  83.9 84.1  MCH 27.4  --  27.2 28.0  MCHC 32.7  --  32.4 33.3  RDW 16.0*  --  15.6* 15.9*  PLT 600*  --  548* 509*   Thyroid  Recent Labs  Lab 05/03/22 1419  TSH 4.318    BNPNo results for input(s): "BNP", "PROBNP" in the last 168 hours.  DDimer No results for input(s): "DDIMER" in the last 168 hours.   Radiology    No results found.  Cardiac Studies     Cardiac MRI 07/22/2021   FINDINGS: 1. Mildly increased left ventricular size, with LVEDD 59 mm, but LVEDVi 95 mL/m2.   Moderate concentric hypertrophy, with intraventricular septal thickness of 12 mm, posterior wall thickness of 12 mm, but with a myocardial mass index of 100 g/m2.   Anterolateral papillary muscle hypertrophy.   Normal left ventricular systolic function (LVEF =40 %). There is basal inferoseptal hypokinesis.   Left ventricular parametric mapping notable for scattered increase in the T2 signal (65%) in the basal inferoseptal and basal anterolateral.   ECV is elevated as high as 80% scattered throughout the base (anteroseptal, inferoseptal, anterolateral, inferolateral) and mid (septal, inferior, anterolateral).   There is late gadolinium enhancement in the left ventricular myocardium: Basal septal LGE in the mid myocardium, basal anterior LGE in the mid myocardium, mid anteroseptal LGE in the mid myocardium, and mid inferior LGE in the mid myocardium.   2. Normal right ventricular size with RVEDVI 73 mL/m2.   Normal right ventricular thickness.   Normal right ventricular systolic function (RVEF =81%). There are no regional wall motion abnormalities or aneurysms.   3.  Normal left and right atrial size.   4. Normal size of the aortic root, ascending aorta and pulmonary artery.   5. Valve assessment:   Aortic Valve: Tri-leaflet aortic valve. Qualitatively no significant regurgitation.   Pulmonic Valve: Qualitatively mild significant regurgitation.   Tricuspid Valve: Qualitatively no significant regurgitation.   Mitral Valve: No significant regurgitation.   6.  Normal pericardium.  No pericardial effusion.   7. Grossly, no extracardiac findings. Recommended dedicated study if concerned for non-cardiac pathology.   8. Breathhold Artifacts noted. Much of the study appears to be free breathing. This decreased the sensitivity of volumetric  assessments. IMPRESSION: Mildly decreased LV function.   There is multifocal and  septal late gadolinium enhancement and scattered increase in ECV.   Clinically less consistent with cardiac amyloidosis.   In the setting of VT, consider PET assessment for Cardiac Sarcoidosis if clinically indicated.       Echocardiogram 04/20/2021   1. Left ventricular ejection fraction, by estimation, is 45 to 50%. The  left ventricle has mildly decreased function. There is moderate left  ventricular hypertrophy.   2. Right ventricular systolic function is normal. The right ventricular  size is normal.   3. Left atrial size was severely dilated.   4. Right atrial size was moderately dilated.   5. The inferior vena cava is normal in size with greater than 50%  respiratory variability, suggesting right atrial pressure of 3 mmHg.   6. Limited echo evaluate LV function.      ECHO 05/03/2022     1. Left ventricular ejection fraction, by estimation, is 45 to 50%. The  left ventricle has mildly decreased function. The left ventricle  demonstrates global hypokinesis. There is mild concentric left ventricular  hypertrophy. Left ventricular diastolic  parameters are consistent with Grade II diastolic dysfunction  (pseudonormalization).   2. Right ventricular systolic function is normal. The right ventricular  size is normal. Tricuspid regurgitation signal is inadequate for assessing  PA pressure.   3. Left atrial size was severely dilated.   4. Right atrial size was mild to moderately dilated.   5. The mitral valve is grossly normal. No evidence of mitral valve  regurgitation. No evidence of mitral stenosis.   6. The aortic valve was not well visualized. Aortic valve regurgitation  is not visualized.   7. The inferior vena cava is normal in size with greater than 50%  respiratory variability, suggesting right atrial pressure of 3 mmHg.   Comparison(s): No significant change from prior study.  Prior images  reviewed side by side.     Patient Profile     66 y.o. male with a PMHx of pAFib on eliquis, cardiomyopathy of uncertain etiology (LVEF 45-50% 04/2022; 36% by cardiac MRI), recurrent problems with sustained VT, CKD stage 3b, history of prolonged Qtc on amiodarone, wheelchair bound, PAD s/p bilateral AKA, T2DM,  peripheral arterial disease, hypertension, hyperlipidemia, chronic lower back pain and hx of polysubstance abuse, who is being seen 05/03/2022 for sustained ventricular tachycardia requiring cardioversion (hypomagnesemia at the time)    Assessment & Plan    VT : No recurrence. K 4.4, Mg 1.9.  Avoiding oral magnesium due to loose stools.  Tolerating mexiletine. CMP: Etiology uncertain.  Very high risk of having coronary artery disease with his extensive history of PAD, but the pattern of gadolinium enhancement on MRI was more consistent with sarcoidosis rather than coronary insufficiency.  Plans for coronary angiography Monday, renal function permitting. CHF: Clinically euvolemic.  Guideline directed medical therapy for heart failure as limited by advanced chronic kidney disease with acute exacerbation. DM: Well-controlled HLP: Very low cholesterol with LDL of 12, also noted albumin 2.4-2.9 over the last 3 months.  Bilirubin and transaminases are normal and he does not look malnourished.  Still wonder whether he may have developed some liver problems.  Have reduced the dose of atorvastatin. PAD: Status post bilateral AKA. AKI on CKD3B: Usual creatinine is in the 2.0-2.2 range, although it was remarkably good at about 1.4 on admission (question error). Worsening since admission possibly due to transient reduction in cardiac output from protracted sustained VT. Today unchanged. Give IV fluids today for cath tomorrow. Chronic low back pain:  Although he does have some marked degenerative changes, no major abnormalities on CT lumbar spine.   Continue mexiletine.  Continue to monitor  renal function.  Tentatively scheduled for left heart catheterization and coronary angiography Monday at noon, if renal function allows (baseline creatinine 2.0-2.2).  Shared Decision Making/Informed Consent The risks [stroke (1 in 1000), death (1 in 1000), kidney failure [usually temporary] (1 in 500), bleeding (1 in 200), allergic reaction [possibly serious] (1 in 200)], benefits (diagnostic support and management of coronary artery disease) and alternatives of a cardiac catheterization were discussed in detail with Mr. Dieguez and he is willing to proceed.     For questions or updates, please contact [place] Please consult [email or web address] for contact info under        Signed, Sanda Klein, MD  05/08/2022, 10:26 AM

## 2022-05-08 NOTE — H&P (View-Only) (Signed)
Rounding Note    Patient Name: Todd [name] Date of Encounter: 05/08/2022  [place] Cardiologist: Minus Breeding, MD   Subjective   No arrhythmia. No angina. Creatinine is unchanged.  Inpatient Medications    Scheduled Meds:  allopurinol  100 mg Oral Daily   ascorbic acid  500 mg Oral BID   atorvastatin  20 mg Oral Daily   Chlorhexidine Gluconate Cloth  6 each Topical Q0600   cholecalciferol  1,000 Units Oral Daily   cyanocobalamin  500 mcg Oral Daily   insulin aspart  0-9 Units Subcutaneous TID WC   isosorbide mononitrate  60 mg Oral Daily   levothyroxine  100 mcg Oral Daily   lidocaine  1 patch Transdermal Q24H   megestrol  400 mg Oral BID   metoprolol succinate  100 mg Oral Daily   mexiletine  150 mg Oral Q8H   mirtazapine  7.5 mg Oral QHS   multivitamin with minerals  1 tablet Oral Daily   mupirocin ointment  1 Application Nasal BID   pantoprazole  40 mg Oral Daily   pneumococcal 20-valent conjugate vaccine  0.5 mL Intramuscular Tomorrow-1000   polyethylene glycol  17 g Oral Daily   potassium chloride SA  20 mEq Oral Daily   sertraline  50 mg Oral Daily   sodium bicarbonate  650 mg Oral TID   sodium chloride flush  3 mL Intravenous Q12H   thiamine  100 mg Oral Daily   tiZANidine  2 mg Oral TID   Continuous Infusions:  sodium chloride     PRN Meds: acetaminophen, cyclobenzaprine, diclofenac Sodium, mouth rinse   Vital Signs    Vitals:   05/07/22 1936 05/07/22 2318 05/08/22 0431 05/08/22 0857  BP: (!) 148/99 (!) 165/69 (!) 170/76 (!) 159/78  Pulse: 68 65 61 67  Resp: '20 16 19 20  '$ Temp: 98.6 F (37 C) 98.9 F (37.2 C) 98 F (36.7 C) 98.7 F (37.1 C)  TempSrc: Oral Oral Oral Oral  SpO2: 99% 99% 98% 98%  Weight:      Height:        Intake/Output Summary (Last 24 hours) at 05/08/2022 1026 Last data filed at 05/08/2022 0911 Gross per 24 hour  Intake 240 ml  Output 1900 ml  Net -1660 ml      05/04/2022   11:00 PM 05/03/2022    10:45 PM 05/03/2022    1:45 PM  Last 3 Weights  Weight (lbs) 107 lb 9.4 oz 100 lb 15.5 oz 149 lb 8 oz  Weight (kg) 48.8 kg 45.8 kg 67.813 kg      Telemetry    NSR - Personally Reviewed  ECG    No new tracing - Personally Reviewed  Physical Exam  comfortable GEN: No acute distress.   Neck: No JVD Cardiac: RRR, no murmurs, rubs, or gallops.  Respiratory: Clear to auscultation bilaterally. GI: Soft, nontender, non-distended  MS: No edema; No deformity. Neuro:  Nonfocal  Psych: Normal affect   Labs    High Sensitivity Troponin:   Recent Labs  Lab 05/03/22 1419 05/03/22 1755  TROPONINIHS 30* 73*     Chemistry Recent Labs  Lab 05/04/22 0305 05/05/22 0324 05/06/22 0416 05/07/22 0101 05/08/22 0117  NA 141 140 140 138 138  K 4.0 4.1 4.2 4.6 4.4  CL 113* 104 106 108 111  CO2 20* 21* 21* 20* 18*  GLUCOSE 109* 91 109* 192* 319*  BUN 21 21 26* 30* 29*  CREATININE 1.47* 2.24*  2.65* 2.43* 2.46*  CALCIUM 7.1* 7.3* 7.0* 6.9* 7.2*  MG 1.9  --  1.9  --  1.9  PROT  --  6.7  --   --   --   ALBUMIN  --  2.4*  --   --   --   AST  --  21  --   --   --   ALT  --  11  --   --   --   ALKPHOS  --  168*  --   --   --   BILITOT  --  0.7  --   --   --   GFRNONAA 52* 31* 26* 28* 28*  ANIONGAP '8 15 13 10 9    '$ Lipids  Recent Labs  Lab 05/06/22 0416  CHOL 47  TRIG 65  HDL 22*  LDLCALC 12  CHOLHDL 2.1    Hematology Recent Labs  Lab 05/03/22 1419 05/03/22 1421 05/04/22 0305 05/05/22 0324  WBC 9.5  --  9.7 12.2*  RBC 4.71  --  4.23 4.21*  HGB 12.9* 14.6 11.5* 11.8*  HCT 39.5 43.0 35.5* 35.4*  MCV 83.9  --  83.9 84.1  MCH 27.4  --  27.2 28.0  MCHC 32.7  --  32.4 33.3  RDW 16.0*  --  15.6* 15.9*  PLT 600*  --  548* 509*   Thyroid  Recent Labs  Lab 05/03/22 1419  TSH 4.318    BNPNo results for input(s): "BNP", "PROBNP" in the last 168 hours.  DDimer No results for input(s): "DDIMER" in the last 168 hours.   Radiology    No results found.  Cardiac Studies     Cardiac MRI 07/22/2021   FINDINGS: 1. Mildly increased left ventricular size, with LVEDD 59 mm, but LVEDVi 95 mL/m2.   Moderate concentric hypertrophy, with intraventricular septal thickness of 12 mm, posterior wall thickness of 12 mm, but with a myocardial mass index of 100 g/m2.   Anterolateral papillary muscle hypertrophy.   Normal left ventricular systolic function (LVEF =78 %). There is basal inferoseptal hypokinesis.   Left ventricular parametric mapping notable for scattered increase in the T2 signal (65%) in the basal inferoseptal and basal anterolateral.   ECV is elevated as high as 80% scattered throughout the base (anteroseptal, inferoseptal, anterolateral, inferolateral) and mid (septal, inferior, anterolateral).   There is late gadolinium enhancement in the left ventricular myocardium: Basal septal LGE in the mid myocardium, basal anterior LGE in the mid myocardium, mid anteroseptal LGE in the mid myocardium, and mid inferior LGE in the mid myocardium.   2. Normal right ventricular size with RVEDVI 73 mL/m2.   Normal right ventricular thickness.   Normal right ventricular systolic function (RVEF =93%). There are no regional wall motion abnormalities or aneurysms.   3.  Normal left and right atrial size.   4. Normal size of the aortic root, ascending aorta and pulmonary artery.   5. Valve assessment:   Aortic Valve: Tri-leaflet aortic valve. Qualitatively no significant regurgitation.   Pulmonic Valve: Qualitatively mild significant regurgitation.   Tricuspid Valve: Qualitatively no significant regurgitation.   Mitral Valve: No significant regurgitation.   6.  Normal pericardium.  No pericardial effusion.   7. Grossly, no extracardiac findings. Recommended dedicated study if concerned for non-cardiac pathology.   8. Breathhold Artifacts noted. Much of the study appears to be free breathing. This decreased the sensitivity of volumetric  assessments. IMPRESSION: Mildly decreased LV function.   There is multifocal and  septal late gadolinium enhancement and scattered increase in ECV.   Clinically less consistent with cardiac amyloidosis.   In the setting of VT, consider PET assessment for Cardiac Sarcoidosis if clinically indicated.       Echocardiogram 04/20/2021   1. Left ventricular ejection fraction, by estimation, is 45 to 50%. The  left ventricle has mildly decreased function. There is moderate left  ventricular hypertrophy.   2. Right ventricular systolic function is normal. The right ventricular  size is normal.   3. Left atrial size was severely dilated.   4. Right atrial size was moderately dilated.   5. The inferior vena cava is normal in size with greater than 50%  respiratory variability, suggesting right atrial pressure of 3 mmHg.   6. Limited echo evaluate LV function.      ECHO 05/03/2022     1. Left ventricular ejection fraction, by estimation, is 45 to 50%. The  left ventricle has mildly decreased function. The left ventricle  demonstrates global hypokinesis. There is mild concentric left ventricular  hypertrophy. Left ventricular diastolic  parameters are consistent with Grade II diastolic dysfunction  (pseudonormalization).   2. Right ventricular systolic function is normal. The right ventricular  size is normal. Tricuspid regurgitation signal is inadequate for assessing  PA pressure.   3. Left atrial size was severely dilated.   4. Right atrial size was mild to moderately dilated.   5. The mitral valve is grossly normal. No evidence of mitral valve  regurgitation. No evidence of mitral stenosis.   6. The aortic valve was not well visualized. Aortic valve regurgitation  is not visualized.   7. The inferior vena cava is normal in size with greater than 50%  respiratory variability, suggesting right atrial pressure of 3 mmHg.   Comparison(s): No significant change from prior study.  Prior images  reviewed side by side.     Patient Profile     66 y.o. male with a PMHx of pAFib on eliquis, cardiomyopathy of uncertain etiology (LVEF 45-50% 04/2022; 36% by cardiac MRI), recurrent problems with sustained VT, CKD stage 3b, history of prolonged Qtc on amiodarone, wheelchair bound, PAD s/p bilateral AKA, T2DM,  peripheral arterial disease, hypertension, hyperlipidemia, chronic lower back pain and hx of polysubstance abuse, who is being seen 05/03/2022 for sustained ventricular tachycardia requiring cardioversion (hypomagnesemia at the time)    Assessment & Plan    VT : No recurrence. K 4.4, Mg 1.9.  Avoiding oral magnesium due to loose stools.  Tolerating mexiletine. CMP: Etiology uncertain.  Very high risk of having coronary artery disease with his extensive history of PAD, but the pattern of gadolinium enhancement on MRI was more consistent with sarcoidosis rather than coronary insufficiency.  Plans for coronary angiography Monday, renal function permitting. CHF: Clinically euvolemic.  Guideline directed medical therapy for heart failure as limited by advanced chronic kidney disease with acute exacerbation. DM: Well-controlled HLP: Very low cholesterol with LDL of 12, also noted albumin 2.4-2.9 over the last 3 months.  Bilirubin and transaminases are normal and he does not look malnourished.  Still wonder whether he may have developed some liver problems.  Have reduced the dose of atorvastatin. PAD: Status post bilateral AKA. AKI on CKD3B: Usual creatinine is in the 2.0-2.2 range, although it was remarkably good at about 1.4 on admission (question error). Worsening since admission possibly due to transient reduction in cardiac output from protracted sustained VT. Today unchanged. Give IV fluids today for cath tomorrow. Chronic low back pain:  Although he does have some marked degenerative changes, no major abnormalities on CT lumbar spine.   Continue mexiletine.  Continue to monitor  renal function.  Tentatively scheduled for left heart catheterization and coronary angiography Monday at noon, if renal function allows (baseline creatinine 2.0-2.2).  Shared Decision Making/Informed Consent The risks [stroke (1 in 1000), death (1 in 1000), kidney failure [usually temporary] (1 in 500), bleeding (1 in 200), allergic reaction [possibly serious] (1 in 200)], benefits (diagnostic support and management of coronary artery disease) and alternatives of a cardiac catheterization were discussed in detail with Mr. Hayduk and he is willing to proceed.     For questions or updates, please contact [place] Please consult [email or web address] for contact info under        Signed, Sanda Klein, MD  05/08/2022, 10:26 AM

## 2022-05-09 ENCOUNTER — Encounter (HOSPITAL_COMMUNITY): Payer: Self-pay | Admitting: Internal Medicine

## 2022-05-09 ENCOUNTER — Encounter (HOSPITAL_COMMUNITY)
Admission: EM | Disposition: A | Discharge status: Skilled Nursing Facility | Payer: Self-pay | Source: Skilled Nursing Facility | Attending: Cardiology

## 2022-05-09 DIAGNOSIS — N179 Acute kidney failure, unspecified: Secondary | ICD-10-CM | POA: Diagnosis not present

## 2022-05-09 DIAGNOSIS — I429 Cardiomyopathy, unspecified: Secondary | ICD-10-CM | POA: Diagnosis not present

## 2022-05-09 DIAGNOSIS — N1832 Chronic kidney disease, stage 3b: Secondary | ICD-10-CM | POA: Diagnosis not present

## 2022-05-09 DIAGNOSIS — R7989 Other specified abnormal findings of blood chemistry: Secondary | ICD-10-CM

## 2022-05-09 DIAGNOSIS — I2510 Atherosclerotic heart disease of native coronary artery without angina pectoris: Secondary | ICD-10-CM | POA: Diagnosis not present

## 2022-05-09 DIAGNOSIS — I472 Ventricular tachycardia, unspecified: Secondary | ICD-10-CM | POA: Diagnosis not present

## 2022-05-09 HISTORY — PX: LEFT HEART CATH AND CORONARY ANGIOGRAPHY: CATH118249

## 2022-05-09 HISTORY — PX: CORONARY STENT INTERVENTION: CATH118234

## 2022-05-09 HISTORY — PX: CORONARY PRESSURE/FFR STUDY: CATH118243

## 2022-05-09 LAB — BASIC METABOLIC PANEL
Anion gap: 14 (ref 5–15)
BUN: 29 mg/dL — ABNORMAL HIGH (ref 8–23)
CO2: 16 mmol/L — ABNORMAL LOW (ref 22–32)
Calcium: 7.7 mg/dL — ABNORMAL LOW (ref 8.9–10.3)
Chloride: 108 mmol/L (ref 98–111)
Creatinine, Ser: 2.07 mg/dL — ABNORMAL HIGH (ref 0.61–1.24)
GFR, Estimated: 34 mL/min — ABNORMAL LOW (ref >60)
Glucose, Bld: 295 mg/dL — ABNORMAL HIGH (ref 70–99)
Potassium: 4.5 mmol/L (ref 3.5–5.1)
Sodium: 138 mmol/L (ref 135–145)

## 2022-05-09 LAB — GLUCOSE, CAPILLARY
Glucose-Capillary: 136 mg/dL — ABNORMAL HIGH (ref 70–99)
Glucose-Capillary: 112 mg/dL — ABNORMAL HIGH (ref 70–99)
Glucose-Capillary: 108 mg/dL — ABNORMAL HIGH (ref 70–99)
Glucose-Capillary: 97 mg/dL (ref 70–99)

## 2022-05-09 SURGERY — LEFT HEART CATH AND CORONARY ANGIOGRAPHY
Anesthesia: LOCAL

## 2022-05-09 MED ORDER — SODIUM CHLORIDE 0.9% FLUSH: 3.0000 mL | INTRAVENOUS | Status: DC | PRN

## 2022-05-09 MED ORDER — LIDOCAINE HCL (PF) 1 % IJ SOLN
INTRAMUSCULAR | Status: AC
  Filled 2022-05-09: qty 30

## 2022-05-09 MED ORDER — SODIUM CHLORIDE 0.9 % IV SOLN: 250.0000 mL | INTRAVENOUS | Status: DC | PRN

## 2022-05-09 MED ORDER — CLOPIDOGREL BISULFATE 75 MG PO TABS
75.0000 mg | ORAL_TABLET | Freq: Every day | ORAL | Status: DC
  Administered 2022-05-10 – 2022-05-11 (×2): 75 mg via ORAL
  Filled 2022-05-09 (×2): qty 1

## 2022-05-09 MED ORDER — HEPARIN (PORCINE) IN NACL 1000-0.9 UT/500ML-% IV SOLN
INTRAVENOUS | Status: DC | PRN
  Administered 2022-05-09 (×2): 500 mL

## 2022-05-09 MED ORDER — SODIUM CHLORIDE 0.9 % IV SOLN: INTRAVENOUS | Status: AC

## 2022-05-09 MED ORDER — ATORVASTATIN CALCIUM 40 MG PO TABS: 40.0000 mg | ORAL_TABLET | Freq: Every day | ORAL | Status: DC

## 2022-05-09 MED ORDER — IOHEXOL 350 MG/ML SOLN
INTRAVENOUS | Status: DC | PRN
  Administered 2022-05-09: 50 mL

## 2022-05-09 MED ORDER — LIDOCAINE HCL (PF) 1 % IJ SOLN
INTRAMUSCULAR | Status: DC | PRN
  Administered 2022-05-09: 2 mL

## 2022-05-09 MED ORDER — VERAPAMIL HCL 2.5 MG/ML IV SOLN
INTRAVENOUS | Status: AC
  Filled 2022-05-09: qty 2

## 2022-05-09 MED ORDER — HYDRALAZINE HCL 20 MG/ML IJ SOLN: 10.0000 mg | INTRAMUSCULAR | Status: AC | PRN

## 2022-05-09 MED ORDER — NITROGLYCERIN 1 MG/10 ML FOR IR/CATH LAB
INTRA_ARTERIAL | Status: DC | PRN
  Administered 2022-05-09: 100 ug via INTRACORONARY

## 2022-05-09 MED ORDER — HEPARIN SODIUM (PORCINE) 5000 UNIT/ML IJ SOLN
5000.0000 [IU] | Freq: Three times a day (TID) | INTRAMUSCULAR | Status: DC
  Administered 2022-05-09 – 2022-05-10 (×2): 5000 [IU] via SUBCUTANEOUS
  Filled 2022-05-09 (×2): qty 1

## 2022-05-09 MED ORDER — SODIUM CHLORIDE 0.9% FLUSH
3.0000 mL | Freq: Two times a day (BID) | INTRAVENOUS | Status: DC
  Administered 2022-05-09 – 2022-05-11 (×3): 3 mL via INTRAVENOUS

## 2022-05-09 MED ORDER — MIDAZOLAM HCL 2 MG/2ML IJ SOLN
INTRAMUSCULAR | Status: DC | PRN
  Administered 2022-05-09: .5 mg via INTRAVENOUS

## 2022-05-09 MED ORDER — HEPARIN SODIUM (PORCINE) 1000 UNIT/ML IJ SOLN
INTRAMUSCULAR | Status: DC | PRN
  Administered 2022-05-09: 3000 [IU] via INTRAVENOUS
  Administered 2022-05-09: 2000 [IU] via INTRAVENOUS
  Administered 2022-05-09 (×2): 3000 [IU] via INTRAVENOUS

## 2022-05-09 MED ORDER — FENTANYL CITRATE (PF) 100 MCG/2ML IJ SOLN
INTRAMUSCULAR | Status: AC
  Filled 2022-05-09: qty 2

## 2022-05-09 MED ORDER — MIDAZOLAM HCL 2 MG/2ML IJ SOLN
INTRAMUSCULAR | Status: AC
  Filled 2022-05-09: qty 2

## 2022-05-09 MED ORDER — NITROGLYCERIN 1 MG/10 ML FOR IR/CATH LAB
INTRA_ARTERIAL | Status: AC
  Filled 2022-05-09: qty 10

## 2022-05-09 MED ORDER — HEPARIN SODIUM (PORCINE) 1000 UNIT/ML IJ SOLN
INTRAMUSCULAR | Status: AC
  Filled 2022-05-09: qty 10

## 2022-05-09 MED ORDER — VERAPAMIL HCL 2.5 MG/ML IV SOLN
INTRAVENOUS | Status: DC | PRN
  Administered 2022-05-09 (×2): 10 mL via INTRA_ARTERIAL

## 2022-05-09 MED ORDER — CLOPIDOGREL BISULFATE 300 MG PO TABS
ORAL_TABLET | ORAL | Status: AC
  Filled 2022-05-09: qty 2

## 2022-05-09 MED ORDER — CLOPIDOGREL BISULFATE 300 MG PO TABS
ORAL_TABLET | ORAL | Status: DC | PRN
  Administered 2022-05-09: 600 mg via ORAL

## 2022-05-09 MED ORDER — FENTANYL CITRATE (PF) 100 MCG/2ML IJ SOLN
INTRAMUSCULAR | Status: DC | PRN
  Administered 2022-05-09: 12.5 ug via INTRAVENOUS

## 2022-05-09 MED ORDER — LABETALOL HCL 5 MG/ML IV SOLN: 10.0000 mg | INTRAVENOUS | Status: AC | PRN

## 2022-05-09 MED ORDER — ASPIRIN 81 MG PO CHEW
81.0000 mg | CHEWABLE_TABLET | Freq: Every day | ORAL | Status: DC
  Administered 2022-05-10: 81 mg via ORAL
  Filled 2022-05-09: qty 1

## 2022-05-09 MED ORDER — HYDRALAZINE HCL 50 MG PO TABS
50.0000 mg | ORAL_TABLET | Freq: Three times a day (TID) | ORAL | Status: DC
  Administered 2022-05-09 – 2022-05-11 (×8): 50 mg via ORAL
  Filled 2022-05-09 (×8): qty 1

## 2022-05-09 MED ORDER — HEPARIN (PORCINE) IN NACL 1000-0.9 UT/500ML-% IV SOLN
INTRAVENOUS | Status: AC
  Filled 2022-05-09: qty 1000

## 2022-05-09 SURGICAL SUPPLY — 21 items
BALL SAPPHIRE NC24 3.0X12 (BALLOONS) ×1
BALLN EMERGE MR 2.25X12 (BALLOONS) ×1
BALLOON EMERGE MR 2.25X12 (BALLOONS) IMPLANT
BALLOON SAPPHIRE NC24 3.0X12 (BALLOONS) IMPLANT
CATH 5FR JL3.5 JR4 ANG PIG MP (CATHETERS) IMPLANT
CATH LAUNCHER 6FR EBU3.5 (CATHETERS) IMPLANT
DEVICE RAD COMP TR BAND LRG (VASCULAR PRODUCTS) IMPLANT
GLIDESHEATH SLEND A-KIT 6F 22G (SHEATH) IMPLANT
GUIDEWIRE INQWIRE 1.5J.035X260 (WIRE) IMPLANT
GUIDEWIRE PRESSURE X 175 (WIRE) IMPLANT
INQWIRE 1.5J .035X260CM (WIRE) ×1
KIT ENCORE 26 ADVANTAGE (KITS) IMPLANT
KIT ESSENTIALS PG (KITS) IMPLANT
KIT HEART LEFT (KITS) ×1 IMPLANT
PACK CARDIAC CATHETERIZATION (CUSTOM PROCEDURE TRAY) ×1 IMPLANT
SHEATH PROBE COVER 6X72 (BAG) IMPLANT
STENT SYNERGY XD 2.75X16 (Permanent Stent) IMPLANT
SYNERGY XD 2.75X16 (Permanent Stent) ×1 IMPLANT
TRANSDUCER W/STOPCOCK (MISCELLANEOUS) ×1 IMPLANT
TUBING CIL FLEX 10 FLL-RA (TUBING) ×1 IMPLANT
WIRE HI TORQ VERSACORE-J 145CM (WIRE) IMPLANT

## 2022-05-09 NOTE — Progress Notes (Signed)
Rounding Note    Patient Name: Todd [name] Date of Encounter: 05/09/2022  [place] Cardiologist: Minus Breeding, MD   Subjective   No chest pain or SOB  Inpatient Medications    Scheduled Meds:  allopurinol  100 mg Oral Daily   ascorbic acid  500 mg Oral BID   atorvastatin  20 mg Oral Daily   cholecalciferol  1,000 Units Oral Daily   cyanocobalamin  500 mcg Oral Daily   hydrALAZINE  50 mg Oral Q8H   insulin aspart  0-9 Units Subcutaneous TID WC   isosorbide mononitrate  60 mg Oral Daily   levothyroxine  100 mcg Oral Daily   lidocaine  1 patch Transdermal Q24H   megestrol  400 mg Oral BID   metoprolol succinate  100 mg Oral Daily   mexiletine  150 mg Oral Q8H   mirtazapine  7.5 mg Oral QHS   multivitamin with minerals  1 tablet Oral Daily   pantoprazole  40 mg Oral Daily   pneumococcal 20-valent conjugate vaccine  0.5 mL Intramuscular Tomorrow-1000   polyethylene glycol  17 g Oral Daily   potassium chloride SA  20 mEq Oral Daily   sertraline  50 mg Oral Daily   sodium bicarbonate  650 mg Oral TID   sodium chloride flush  3 mL Intravenous Q12H   sodium chloride flush  3 mL Intravenous Q12H   thiamine  100 mg Oral Daily   tiZANidine  2 mg Oral TID   Continuous Infusions:  sodium chloride 50 mL/hr at 05/09/22 0513   sodium chloride     sodium chloride     PRN Meds: sodium chloride, acetaminophen, cyclobenzaprine, diclofenac Sodium, mouth rinse, sodium chloride flush   Vital Signs    Vitals:   05/09/22 0400 05/09/22 0512 05/09/22 0600 05/09/22 0743  BP:  (!) 165/87 (!) 164/86 (!) 140/73  Pulse: 61 64 61 61  Resp: '19 20 19 18  '$ Temp:  98.6 F (37 C)  98.2 F (36.8 C)  TempSrc:  Oral  Oral  SpO2: 99% 99% 100% 97%  Weight:  67.6 kg    Height:        Intake/Output Summary (Last 24 hours) at 05/09/2022 0801 Last data filed at 05/09/2022 0600 Gross per 24 hour  Intake 1197.65 ml  Output 1700 ml  Net -502.35 ml      05/09/2022    5:12  AM 05/04/2022   11:00 PM 05/03/2022   10:45 PM  Last 3 Weights  Weight (lbs) 149 lb 0.5 oz 107 lb 9.4 oz 100 lb 15.5 oz  Weight (kg) 67.6 kg 48.8 kg 45.8 kg      Telemetry    SR - Personally Reviewed  ECG    No new - Personally Reviewed  Physical Exam   GEN: No acute distress.   Neck: No JVD Cardiac: RRR, no murmurs, rubs, or gallops.  Respiratory: Clear to auscultation bilaterally. GI: Soft, nontender, non-distended  MS: BIL AKA Neuro:  Nonfocal  Psych: Normal affect   Labs    High Sensitivity Troponin:   Recent Labs  Lab 05/03/22 1419 05/03/22 1755  TROPONINIHS 30* 73*     Chemistry Recent Labs  Lab 05/04/22 0305 05/05/22 0324 05/06/22 0416 05/07/22 0101 05/08/22 0117 05/09/22 0121  NA 141 140 140 138 138 138  K 4.0 4.1 4.2 4.6 4.4 4.5  CL 113* 104 106 108 111 108  CO2 20* 21* 21* 20* 18* 16*  GLUCOSE 109* 91  109* 192* 319* 295*  BUN 21 21 26* 30* 29* 29*  CREATININE 1.47* 2.24* 2.65* 2.43* 2.46* 2.07*  CALCIUM 7.1* 7.3* 7.0* 6.9* 7.2* 7.7*  MG 1.9  --  1.9  --  1.9  --   PROT  --  6.7  --   --   --   --   ALBUMIN  --  2.4*  --   --   --   --   AST  --  21  --   --   --   --   ALT  --  11  --   --   --   --   ALKPHOS  --  168*  --   --   --   --   BILITOT  --  0.7  --   --   --   --   GFRNONAA 52* 31* 26* 28* 28* 34*  ANIONGAP '8 15 13 10 9 14    '$ Lipids  Recent Labs  Lab 05/06/22 0416  CHOL 47  TRIG 65  HDL 22*  LDLCALC 12  CHOLHDL 2.1    Hematology Recent Labs  Lab 05/03/22 1419 05/03/22 1421 05/04/22 0305 05/05/22 0324  WBC 9.5  --  9.7 12.2*  RBC 4.71  --  4.23 4.21*  HGB 12.9* 14.6 11.5* 11.8*  HCT 39.5 43.0 35.5* 35.4*  MCV 83.9  --  83.9 84.1  MCH 27.4  --  27.2 28.0  MCHC 32.7  --  32.4 33.3  RDW 16.0*  --  15.6* 15.9*  PLT 600*  --  548* 509*   Thyroid  Recent Labs  Lab 05/03/22 1419  TSH 4.318    BNPNo results for input(s): "BNP", "PROBNP" in the last 168 hours.  DDimer No results for input(s): "DDIMER" in the  last 168 hours.   Radiology    No results found.  Cardiac Studies   Cardiac MRI 07/22/2021   FINDINGS: 1. Mildly increased left ventricular size, with LVEDD 59 mm, but LVEDVi 95 mL/m2.   Moderate concentric hypertrophy, with intraventricular septal thickness of 12 mm, posterior wall thickness of 12 mm, but with a myocardial mass index of 100 g/m2.   Anterolateral papillary muscle hypertrophy.   Normal left ventricular systolic function (LVEF =79 %). There is basal inferoseptal hypokinesis.   Left ventricular parametric mapping notable for scattered increase in the T2 signal (65%) in the basal inferoseptal and basal anterolateral.   ECV is elevated as high as 80% scattered throughout the base (anteroseptal, inferoseptal, anterolateral, inferolateral) and mid (septal, inferior, anterolateral).   There is late gadolinium enhancement in the left ventricular myocardium: Basal septal LGE in the mid myocardium, basal anterior LGE in the mid myocardium, mid anteroseptal LGE in the mid myocardium, and mid inferior LGE in the mid myocardium.   2. Normal right ventricular size with RVEDVI 73 mL/m2.   Normal right ventricular thickness.   Normal right ventricular systolic function (RVEF =02%). There are no regional wall motion abnormalities or aneurysms.   3.  Normal left and right atrial size.   4. Normal size of the aortic root, ascending aorta and pulmonary artery.   5. Valve assessment:   Aortic Valve: Tri-leaflet aortic valve. Qualitatively no significant regurgitation.   Pulmonic Valve: Qualitatively mild significant regurgitation.   Tricuspid Valve: Qualitatively no significant regurgitation.   Mitral Valve: No significant regurgitation.   6.  Normal pericardium.  No pericardial effusion.   7. Grossly, no extracardiac findings. Recommended dedicated  study if concerned for non-cardiac pathology.   8. Breathhold Artifacts noted. Much of the study appears to be  free breathing. This decreased the sensitivity of volumetric assessments. IMPRESSION: Mildly decreased LV function.   There is multifocal and septal late gadolinium enhancement and scattered increase in ECV.   Clinically less consistent with cardiac amyloidosis.   In the setting of VT, consider PET assessment for Cardiac Sarcoidosis if clinically indicated.       Echocardiogram 04/20/2021   1. Left ventricular ejection fraction, by estimation, is 45 to 50%. The  left ventricle has mildly decreased function. There is moderate left  ventricular hypertrophy.   2. Right ventricular systolic function is normal. The right ventricular  size is normal.   3. Left atrial size was severely dilated.   4. Right atrial size was moderately dilated.   5. The inferior vena cava is normal in size with greater than 50%  respiratory variability, suggesting right atrial pressure of 3 mmHg.   6. Limited echo evaluate LV function.      ECHO 05/03/2022     1. Left ventricular ejection fraction, by estimation, is 45 to 50%. The  left ventricle has mildly decreased function. The left ventricle  demonstrates global hypokinesis. There is mild concentric left ventricular  hypertrophy. Left ventricular diastolic  parameters are consistent with Grade II diastolic dysfunction  (pseudonormalization).   2. Right ventricular systolic function is normal. The right ventricular  size is normal. Tricuspid regurgitation signal is inadequate for assessing  PA pressure.   3. Left atrial size was severely dilated.   4. Right atrial size was mild to moderately dilated.   5. The mitral valve is grossly normal. No evidence of mitral valve  regurgitation. No evidence of mitral stenosis.   6. The aortic valve was not well visualized. Aortic valve regurgitation  is not visualized.   7. The inferior vena cava is normal in size with greater than 50%  respiratory variability, suggesting right atrial pressure of 3 mmHg.    Comparison(s): No significant change from prior study. Prior images  reviewed side by side.     Patient Profile     66 y.o. male with a PMHx of pAFib on eliquis, cardiomyopathy of uncertain etiology (LVEF 45-50% 04/2022; 36% by cardiac MRI), recurrent problems with sustained VT, CKD stage 3b, history of prolonged Qtc on amiodarone, wheelchair bound, PAD s/p bilateral AKA, T2DM,  peripheral arterial disease, hypertension, hyperlipidemia, chronic lower back pain and hx of polysubstance abuse, who is being seen 05/03/2022 for sustained ventricular tachycardia requiring cardioversion (hypomagnesemia at the time)     Assessment & Plan    VT : No recurrence. K 4.4, Mg 1.9 on the 26th.  Avoiding oral magnesium due to loose stools.  Tolerating mexiletine.  Check Mg+ tomorrow CMP: Etiology uncertain.  Very high risk of having coronary artery disease with his extensive history of PAD, but the pattern of gadolinium enhancement on MRI was more consistent with sarcoidosis rather than coronary insufficiency.  Plans for coronary angiography Monday, renal function permitting. CHF: Clinically euvolemic.  Guideline directed medical therapy for heart failure as limited by advanced chronic kidney disease with acute exacerbation. Pt was not taking lasix at home per notes  DM: with glucose 211 to 332 though this AM 136   (August A1c of 5.6)  may need to add back some Lantus will have diabetes coordinator to see HLP: Very low cholesterol with LDL of 12, also noted albumin 2.4-2.9 over the last 3  months.  Bilirubin and transaminases are normal and he does not look malnourished.  Still wonder whether he may have developed some liver problems.  Have reduced the dose of atorvastatin. PAD: Status post bilateral AKA. AKI on CKD3B: Usual creatinine is in the 2.0-2.2 range, although it was remarkably good at about 1.4 on admission (question error). Worsening since admission possibly due to transient reduction in cardiac output  from protracted sustained VT.  Cr 2.43>>2.46>>2.07  Chronic low back pain: Although he does have some marked degenerative changes, no major abnormalities on CT lumbar spine. PAF on eliquis  in SR on BB    Continue mexiletine.  Continue to monitor renal function.   Sister requests call about renal function and cath.     Tentatively scheduled for left heart catheterization and coronary angiography Monday at noon, if renal function allows (baseline creatinine 2.0-2.2).  For questions or updates, please contact [place] Please consult [email or web address] for contact info under        Signed, Cecilie Kicks, NP  05/09/2022, 8:01 AM

## 2022-05-09 NOTE — Inpatient Diabetes Management (Signed)
Inpatient Diabetes Program Recommendations  AACE/ADA: New Consensus Statement on Inpatient Glycemic Control (2015)  Target Ranges:  Prepandial:   less than 140 mg/dL      Peak postprandial:   less than 180 mg/dL (1-2 hours)      Critically ill patients:  140 - 180 mg/dL   Lab Results  Component Value Date   GLUCAP 136 (H) 05/09/2022   HGBA1C 5.6 01/11/2022    Review of Glycemic Control  Latest Reference Range & Units 05/07/22 06:01 05/07/22 11:50 05/07/22 16:28 05/07/22 21:28 05/08/22 06:23 05/08/22 11:28 05/08/22 15:39 05/08/22 21:11 05/09/22 06:01  Glucose-Capillary 70 - 99 mg/dL 180 (H) 148 (H) 285 (H) 269 (H) 332 (H) 211 (H) 225 (H) 295 (H) 136 (H)    Diabetes history: DM 2 Outpatient Diabetes medications: Lantus 16 units qhs, Humalog 0-6 units tid Current orders for Inpatient glycemic control: Novolog 0-9 units tid  Inpatient Diabetes Program Recommendations:    Note: Renal function elevated. Pt holds onto insulin for longer periods of time. Pt on dialysis. Glucose trends not consistent and does not follow a clear pattern. When not eating trends in the low 100 range.   -  Reduce Novolog Correction to "very sensitive" 0-6 units tid and change frequency to Q4 hours.   Thanks,  Tama Headings RN, MSN, BC-ADM Inpatient Diabetes Coordinator Team Pager [phone] (8a-5p)

## 2022-05-09 NOTE — Progress Notes (Signed)
TR BAND REMOVAL  LOCATION:    Right radial  DEFLATED PER PROTOCOL:    Yes.    TIME BAND OFF / DRESSING APPLIED:    1500pm a clean dry dressing applied with tegaderm and gauze   SITE UPON ARRIVAL:    Level 0  SITE AFTER BAND REMOVAL:    Level 0  CIRCULATION SENSATION AND MOVEMENT:    Within Normal Limits   Yes.    COMMENTS:   Care instructions given to patient.

## 2022-05-09 NOTE — Progress Notes (Addendum)
Ms. Todd [name], the Pt's sister called and requested Dr. Sallyanne Kuster, the cardiologist to please call her back at [phone] before the cardiac cath this am. She expressed her concern about Pt's poor renal function due to elevated BUN and Cr level and low GFRe, and might not be a good candidate for cardiac cath at this time.    She requested to call me back again at am for update the lab results. All questions were answered. She expressed her appreciations. We will hand off the day shift team.     Pt is alert and fully oriented x 4. He is hemodynamically stable, afebrile, no acute distress. We will monitor.  Todd Lose, RN

## 2022-05-09 NOTE — Progress Notes (Signed)
Pt had a complaint of missing his prescription eye glasses since he transferred to 4E16. We found his eye glasses in room 2H25  which was his room prior transferred to 4E.  All of his personal belongings including his eye glasses handed to Pt and he kept at bedside. Pt is aware that the only thing that he has not got back is his $30 cash deposited at security department. '  [name], RN

## 2022-05-09 NOTE — Interval H&P Note (Signed)
History and Physical Interval Note:  05/09/2022 10:34 AM  Todd [name]  has presented today for surgery, with the diagnosis of ventricular tachycardia, cardiomyopathy, and elevated troponin.  The various methods of treatment have been discussed with the patient and family. After consideration of risks, benefits and other options for treatment, the patient has consented to  Procedure(s): LEFT HEART CATH AND CORONARY ANGIOGRAPHY (N/A) as a surgical intervention.  The patient's history has been reviewed, patient examined, no change in status, stable for surgery.  I have reviewed the patient's chart and labs.  Questions were answered to the patient's satisfaction.    Cath Lab Visit (complete for each Cath Lab visit)  Clinical Evaluation Leading to the Procedure:   ACS: Yes.    Non-ACS:  N/A  Judye Lorino

## 2022-05-09 NOTE — Progress Notes (Signed)
Ms Todd [name] called back the 2nd time tonight with her major concern and requested RN to report the kidney function lab result this am.  She look forward to discuss with Dr. [name], or the cardiologist team this am before the cardiac cath. All questions were answered. We will hand of to the morning round.  [name], RN

## 2022-05-10 ENCOUNTER — Inpatient Hospital Stay (HOSPITAL_COMMUNITY): Payer: Medicare (Managed Care)

## 2022-05-10 ENCOUNTER — Encounter (HOSPITAL_COMMUNITY): Payer: Self-pay | Admitting: Internal Medicine

## 2022-05-10 DIAGNOSIS — I2510 Atherosclerotic heart disease of native coronary artery without angina pectoris: Secondary | ICD-10-CM | POA: Diagnosis not present

## 2022-05-10 DIAGNOSIS — N1832 Chronic kidney disease, stage 3b: Secondary | ICD-10-CM | POA: Diagnosis not present

## 2022-05-10 DIAGNOSIS — E118 Type 2 diabetes mellitus with unspecified complications: Secondary | ICD-10-CM

## 2022-05-10 DIAGNOSIS — R0989 Other specified symptoms and signs involving the circulatory and respiratory systems: Secondary | ICD-10-CM

## 2022-05-10 DIAGNOSIS — N179 Acute kidney failure, unspecified: Secondary | ICD-10-CM | POA: Diagnosis not present

## 2022-05-10 DIAGNOSIS — I472 Ventricular tachycardia, unspecified: Secondary | ICD-10-CM | POA: Diagnosis not present

## 2022-05-10 DIAGNOSIS — I5021 Acute systolic (congestive) heart failure: Secondary | ICD-10-CM

## 2022-05-10 LAB — CBC
HCT: 33.1 % — ABNORMAL LOW (ref 39.0–52.0)
Hemoglobin: 11.1 g/dL — ABNORMAL LOW (ref 13.0–17.0)
MCH: 28 pg (ref 26.0–34.0)
MCHC: 33.5 g/dL (ref 30.0–36.0)
MCV: 83.4 fL (ref 80.0–100.0)
Platelets: 575 10*3/uL — ABNORMAL HIGH (ref 150–400)
RBC: 3.97 MIL/uL — ABNORMAL LOW (ref 4.22–5.81)
RDW: 15.1 % (ref 11.5–15.5)
WBC: 14.2 10*3/uL — ABNORMAL HIGH (ref 4.0–10.5)
nRBC: 0 % (ref 0.0–0.2)

## 2022-05-10 LAB — GLUCOSE, CAPILLARY
Glucose-Capillary: 106 mg/dL — ABNORMAL HIGH (ref 70–99)
Glucose-Capillary: 166 mg/dL — ABNORMAL HIGH (ref 70–99)
Glucose-Capillary: 149 mg/dL — ABNORMAL HIGH (ref 70–99)
Glucose-Capillary: 157 mg/dL — ABNORMAL HIGH (ref 70–99)

## 2022-05-10 LAB — BASIC METABOLIC PANEL
Anion gap: 10 (ref 5–15)
BUN: 27 mg/dL — ABNORMAL HIGH (ref 8–23)
CO2: 18 mmol/L — ABNORMAL LOW (ref 22–32)
Calcium: 8.2 mg/dL — ABNORMAL LOW (ref 8.9–10.3)
Chloride: 109 mmol/L (ref 98–111)
Creatinine, Ser: 2.28 mg/dL — ABNORMAL HIGH (ref 0.61–1.24)
GFR, Estimated: 31 mL/min — ABNORMAL LOW (ref >60)
Glucose, Bld: 110 mg/dL — ABNORMAL HIGH (ref 70–99)
Potassium: 5.5 mmol/L — ABNORMAL HIGH (ref 3.5–5.1)
Sodium: 137 mmol/L (ref 135–145)

## 2022-05-10 LAB — POCT ACTIVATED CLOTTING TIME
Activated Clotting Time: 233 seconds
Activated Clotting Time: 251 seconds
Activated Clotting Time: 281 seconds

## 2022-05-10 LAB — MAGNESIUM: Magnesium: 1.6 mg/dL — ABNORMAL LOW (ref 1.7–2.4)

## 2022-05-10 MED ORDER — HEPARIN SODIUM (PORCINE) 5000 UNIT/ML IJ SOLN
5000.0000 [IU] | Freq: Three times a day (TID) | INTRAMUSCULAR | Status: DC
  Administered 2022-05-10 – 2022-05-11 (×4): 5000 [IU] via SUBCUTANEOUS
  Filled 2022-05-10 (×4): qty 1

## 2022-05-10 MED ORDER — ATORVASTATIN CALCIUM 80 MG PO TABS
80.0000 mg | ORAL_TABLET | Freq: Every day | ORAL | Status: DC
  Administered 2022-05-10 – 2022-05-11 (×2): 80 mg via ORAL
  Filled 2022-05-10 (×2): qty 1

## 2022-05-10 MED ORDER — INSULIN GLARGINE-YFGN 100 UNIT/ML ~~LOC~~ SOLN
10.0000 [IU] | Freq: Every day | SUBCUTANEOUS | Status: DC
  Administered 2022-05-10 – 2022-05-11 (×2): 10 [IU] via SUBCUTANEOUS
  Filled 2022-05-10 (×2): qty 0.1

## 2022-05-10 MED ORDER — MAGNESIUM SULFATE 2 GM/50ML IV SOLN
2.0000 g | Freq: Once | INTRAVENOUS | Status: AC
  Administered 2022-05-10: 2 g via INTRAVENOUS
  Filled 2022-05-10: qty 50

## 2022-05-10 MED ORDER — APIXABAN 5 MG PO TABS: 5.0000 mg | ORAL_TABLET | Freq: Two times a day (BID) | ORAL | Status: DC

## 2022-05-10 NOTE — NC FL2 (Signed)
[place] LEVEL OF CARE SCREENING TOOL     IDENTIFICATION  Patient Name: Todd [name] Birthdate: Jun 14, 1955 Sex: male Admission Date (Current Location): 05/03/2022  [facility] and [place] Number:  Herbalist and Address:  [facility], [place] [address] [place], [place] [address]      Provider Number: [id number]  Attending Physician Name and Address:  Arnoldo Lenis, MD  Relative Name and Phone Number:       Current Level of Care: Hospital Recommended Level of Care: [place] Prior Approval Number:    Date Approved/Denied:   PASRR Number: [id number]  Discharge Plan: SNF    Current Diagnoses: Patient Active Problem List   Diagnosis Date Noted   Acute systolic heart failure ([place]) 05/10/2022   Ventricular tachyarrhythmia ([place]) 05/03/2022   VT (ventricular tachycardia) ([place]) 05/03/2022   Cardiomyopathy ([place]) 08/29/2021   Persistent atrial fibrillation ([place]) 08/29/2021   Stage 3b chronic kidney disease (CKD) ([place]) 08/29/2021   QT prolongation 08/29/2021   CHF (congestive heart failure) ([place]) 05/27/2021   SOB (shortness of breath)    CAP (community acquired pneumonia) 04/20/2021   Elevated brain natriuretic peptide (BNP) level 04/20/2021   Polysubstance abuse ([place]) 04/20/2021   Noncompliance with medication regimen 04/20/2021   Chronic diastolic CHF (congestive heart failure) ([place]) 04/20/2021   Hypoalbuminemia due to protein-calorie malnutrition ([place]) 04/20/2021   GERD (gastroesophageal reflux disease) 04/20/2021   Candida esophagitis (HCC)    Iron deficiency anemia due to chronic blood loss 01/11/2021   Blood in stool    Atrial fibrillation with RVR ([place]) 01/10/2021   Hypomagnesemia 01/10/2021   Severe Hypophosphatemia 12/28/2020   Non-Sustained V-tach in the setting of Severe Electrolyte Derangement 12/28/2020   Cocaine abuse -on-going 12/28/2020   Alcohol abuse, continuous 12/28/2020   Syncope and  collapse-suspect arrhythmia related in the setting of severe electrolyte abnormalities 12/28/2020   Type 2 diabetes mellitus with complication, with long-term current use of insulin ([place]) 12/28/2020   Tobacco abuse 12/28/2020   Atrial fibrillation, chronic ([place]) 12/26/2020   Hypocalcemia 46/56/8127   Acute metabolic encephalopathy 51/70/0174   Hypoglycemia 12/05/2020   AKI (acute kidney injury) ([place]) 12/04/2020   Vitamin D deficiency 06/17/2019   Essential hypertension, benign 08/30/2018   Mixed hyperlipidemia 07/17/2018   Hyponatremia 02/14/2018   Hypokalemia 02/14/2018   Medically noncompliant 02/14/2018   S/P AKA (above knee amputation) bilateral ([place]) 02/12/2018   Current smoker 02/03/2018   Hyperglycemia due to diabetes mellitus ([place]) 02/03/2018   Leukocytosis 02/03/2018   Thrombocytosis 02/03/2018   Urinary incontinence 08/14/2017   Transaminitis    Acute respiratory failure with hypoxia (HCC)    Elevated troponin 07/04/2016   Hyperuricemia 06/21/2016   Essential hypertension 01/05/2016   Diabetes type 2, uncontrolled 01/05/2016   Acquired hypothyroidism 05/10/2011    Orientation RESPIRATION BLADDER Height & Weight     Self, Time, Situation, Place  Normal Incontinent, External catheter Weight: 149 lb 0.5 oz (67.6 kg) Height:  5' (152.4 cm)  BEHAVIORAL SYMPTOMS/MOOD NEUROLOGICAL BOWEL NUTRITION STATUS      Incontinent Diet (see dc summary)  AMBULATORY STATUS COMMUNICATION OF NEEDS Skin   Total Care Verbally Normal                       Personal Care Assistance Level of Assistance  Bathing, Feeding, Dressing Bathing Assistance: Limited assistance Feeding assistance: Limited assistance Dressing Assistance: Limited assistance     Functional Limitations Info  Sight, Hearing,  Speech Sight Info: Impaired Hearing Info: Adequate Speech Info: Adequate    SPECIAL CARE FACTORS FREQUENCY  PT (By licensed PT), OT (By licensed OT)     PT Frequency: 3x week OT  Frequency: 3x week            Contractures Contractures Info: Not present    Additional Factors Info  Code Status, Allergies, Insulin Sliding Scale, Psychotropic Code Status Info: Full Allergies Info: Lisinopril Psychotropic Info: hydrALAZINE (APRESOLINE) tablet 50 mg  sertraline (ZOLOFT) tablet 50 mg Insulin Sliding Scale Info: insulin aspart (novoLOG) injection 0-9 Units       Current Medications (05/10/2022):  This is the current hospital active medication list Current Facility-Administered Medications  Medication Dose Route Frequency Provider Last Rate Last Admin   0.9 %  sodium chloride infusion  250 mL Intravenous PRN End, Harrell Gave, MD       acetaminophen (TYLENOL) tablet 650 mg  650 mg Oral Q4H PRN End, Harrell Gave, MD   650 mg at 05/05/22 2012   allopurinol (ZYLOPRIM) tablet 100 mg  100 mg Oral Daily End, Christopher, MD   100 mg at 05/10/22 1107   ascorbic acid (VITAMIN C) tablet 500 mg  500 mg Oral BID End, Christopher, MD   500 mg at 05/10/22 1109   aspirin chewable tablet 81 mg  81 mg Oral Daily End, Christopher, MD   81 mg at 05/10/22 1108   atorvastatin (LIPITOR) tablet 80 mg  80 mg Oral Daily Geralynn Rile, MD   80 mg at 05/10/22 1108   cholecalciferol (VITAMIN D3) 25 MCG (1000 UNIT) tablet 1,000 Units  1,000 Units Oral Daily End, Christopher, MD   1,000 Units at 05/10/22 1108   clopidogrel (PLAVIX) tablet 75 mg  75 mg Oral Q breakfast End, Christopher, MD   75 mg at 05/10/22 1108   cyclobenzaprine (FLEXERIL) tablet 10 mg  10 mg Oral TID PRN End, Harrell Gave, MD   10 mg at 05/08/22 0900   diclofenac Sodium (VOLTAREN) 1 % topical gel 2 g  2 g Topical Q12H PRN End, Harrell Gave, MD       heparin injection 5,000 Units  5,000 Units Subcutaneous Q8H O'Neal, Cassie Freer, MD   5,000 Units at 05/10/22 1107   hydrALAZINE (APRESOLINE) tablet 50 mg  50 mg Oral Q8H End, Christopher, MD   50 mg at 05/10/22 1257   insulin aspart (novoLOG) injection 0-9 Units  0-9 Units  Subcutaneous TID WC End, Harrell Gave, MD   2 Units at 05/10/22 1256   insulin glargine-yfgn (SEMGLEE) injection 10 Units  10 Units Subcutaneous Daily Geralynn Rile, MD   10 Units at 05/10/22 1107   isosorbide mononitrate (IMDUR) 24 hr tablet 60 mg  60 mg Oral Daily End, Christopher, MD   60 mg at 05/10/22 1108   levothyroxine (SYNTHROID) tablet 100 mcg  100 mcg Oral Daily End, Christopher, MD   100 mcg at 05/10/22 0631   lidocaine (LIDODERM) 5 % 1 patch  1 patch Transdermal Q24H End, Christopher, MD   1 patch at 05/09/22 2053   megestrol (MEGACE) 400 MG/10ML suspension 400 mg  400 mg Oral BID End, Christopher, MD   400 mg at 05/10/22 1111   metoprolol succinate (TOPROL-XL) 24 hr tablet 100 mg  100 mg Oral Daily End, Christopher, MD   100 mg at 05/10/22 1108   mexiletine (MEXITIL) capsule 150 mg  150 mg Oral Q8H End, Christopher, MD   150 mg at 05/10/22 1257   mirtazapine (REMERON) tablet  7.5 mg  7.5 mg Oral QHS End, Christopher, MD   7.5 mg at 05/09/22 2055   multivitamin with minerals tablet 1 tablet  1 tablet Oral Daily End, Christopher, MD   1 tablet at 05/10/22 1108   Oral care mouth rinse  15 mL Mouth Rinse PRN End, Harrell Gave, MD       pantoprazole (PROTONIX) EC tablet 40 mg  40 mg Oral Daily End, Christopher, MD   40 mg at 05/10/22 1108   pneumococcal 20-valent conjugate vaccine (PREVNAR 20) injection 0.5 mL  0.5 mL Intramuscular Tomorrow-1000 End, Christopher, MD       polyethylene glycol (MIRALAX / GLYCOLAX) packet 17 g  17 g Oral Daily End, Christopher, MD       sertraline (ZOLOFT) tablet 50 mg  50 mg Oral Daily End, Christopher, MD   50 mg at 05/10/22 1107   sodium bicarbonate tablet 650 mg  650 mg Oral TID End, Christopher, MD   650 mg at 05/10/22 1108   sodium chloride flush (NS) 0.9 % injection 3 mL  3 mL Intravenous Q12H End, Christopher, MD   3 mL at 05/10/22 1112   sodium chloride flush (NS) 0.9 % injection 3 mL  3 mL Intravenous Q12H End, Christopher, MD       sodium  chloride flush (NS) 0.9 % injection 3 mL  3 mL Intravenous Q12H End, Christopher, MD   3 mL at 05/09/22 1613   sodium chloride flush (NS) 0.9 % injection 3 mL  3 mL Intravenous PRN End, Harrell Gave, MD       thiamine (VITAMIN B1) tablet 100 mg  100 mg Oral Daily End, Christopher, MD   100 mg at 05/10/22 1107   tiZANidine (ZANAFLEX) tablet 2 mg  2 mg Oral TID End, Christopher, MD   2 mg at 05/10/22 1258   vitamin B-12 (CYANOCOBALAMIN) tablet 500 mcg  500 mcg Oral Daily End, Christopher, MD   500 mcg at 05/10/22 1107     Discharge Medications: Please see discharge summary for a list of discharge medications.  Relevant Imaging Results:  Relevant Lab Results:   Additional Information    Brittinie Wherley B Chelsey Redondo, LCSWA

## 2022-05-10 NOTE — TOC Initial Note (Addendum)
Transition of Care [facility]) - Initial/Assessment Note    Patient Details  Name: Todd [name] MRN: [id number] Date of Birth: 08-Jan-1956  [facility] CM/SW Contact:    Milas Gain, [place] Phone Number: 05/10/2022, 2:56 PM  Clinical Narrative:                  CSW spoke with patient at bedside. Patient reports he comes from [facility] term. Patient confirmed his plan is to return when medically ready for discharge.All questions answered. No further questions reported at this time.CSW spoke with [facility] who confirmed they can accept patient back when medically ready for dc. CSW received consult, patients sister has concerns regarding patients current LTC facility. CSW provided patients sister [name] with  [place] telephone number to report concerns if needed. CSW informed patients sister if patient ever interested in another LTC facility to follow up with current facility to help assist with with transferring patient to another LTC facility. All questions answered. No further questions reported at this time. CSW will continue to follow and assist with patients dc planning needs.    No further questions reported at this time.CSW will continue to follow and assist with patients dc planning needs.  Expected Discharge Plan: Skilled Nursing Facility Barriers to Discharge: Continued Medical Work up   Patient Goals and CMS Choice Patient states their goals for this hospitalization and ongoing recovery are:: SNF CMS Medicare.gov Compare Post Acute Care list provided to:: Patient Choice offered to / list presented to : Patient  Expected Discharge Plan and Services Expected Discharge Plan: [place] In-house Referral: Clinical Social Work     Living arrangements for the past 2 months: [place]                                      Prior Living Arrangements/Services Living arrangements for the past 2 months: [place] Lives with:: Facility Resident Patient language and need for interpreter reviewed:: Yes Do you feel safe going back to the place where you live?: Yes      Need for Family Participation in Patient Care: Yes (Comment) Care giver support system in place?: Yes (comment)   Criminal Activity/Legal Involvement Pertinent to Current Situation/Hospitalization: No - Comment as needed  Activities of Daily Living Home Assistive Devices/Equipment: Eyeglasses, Splint (specify type), Wheelchair (Left wrist) ADL Screening (condition at time of admission) Patient's cognitive ability adequate to safely complete daily activities?: Yes Is the patient deaf or have difficulty hearing?: No Does the patient have difficulty seeing, even when wearing glasses/contacts?: No Does the patient have difficulty concentrating, remembering, or making decisions?: No Patient able to express need for assistance with ADLs?: Yes Does the patient have difficulty dressing or bathing?: Yes Independently performs ADLs?: No Communication: Independent Dressing (OT): Needs assistance Is this a change from baseline?: Pre-admission baseline Grooming: Independent Feeding: Independent Bathing: Needs assistance Is this a change from baseline?: Pre-admission baseline Toileting: Needs assistance Is this a change from baseline?: Pre-admission baseline In/Out Bed: Dependent Is this a change from baseline?: Pre-admission baseline Walks in Home: Dependent Is this a change from baseline?: Pre-admission baseline Does the patient have difficulty walking or climbing stairs?: Yes (Bilateral AKA wheelchair bound) Weakness of Legs: Both Weakness of Arms/Hands: None  Permission Sought/Granted Permission sought to share information with : Case Manager, Customer service manager, Family Supports Permission granted to share information with :  Yes, Verbal Permission Granted  Share Information with NAME: [name]  Permission granted to  share info w AGENCY: SNF  Permission granted to share info w Relationship: sister  Permission granted to share info w Contact Information: [name] [phone]  Emotional Assessment Appearance:: Appears stated age Attitude/Demeanor/Rapport: Gracious Affect (typically observed): Calm Orientation: : Oriented to Self, Oriented to Place, Oriented to  Time, Oriented to Situation Alcohol / Substance Use: Not Applicable Psych Involvement: No (comment)  Admission diagnosis:  Hypocalcemia [E83.51] Ventricular tachyarrhythmia (HCC) [I47.20] Hypomagnesemia [E83.42] VT (ventricular tachycardia) (HCC) [I47.20] Patient Active Problem List   Diagnosis Date Noted   Acute systolic heart failure ([place]) 05/10/2022   Ventricular tachyarrhythmia ([place]) 05/03/2022   VT (ventricular tachycardia) ([place]) 05/03/2022   Cardiomyopathy ([place]) 08/29/2021   Persistent atrial fibrillation ([place]) 08/29/2021   Stage 3b chronic kidney disease (CKD) ([place]) 08/29/2021   QT prolongation 08/29/2021   CHF (congestive heart failure) ([place]) 05/27/2021   SOB (shortness of breath)    CAP (community acquired pneumonia) 04/20/2021   Elevated brain natriuretic peptide (BNP) level 04/20/2021   Polysubstance abuse ([place]) 04/20/2021   Noncompliance with medication regimen 04/20/2021   Chronic diastolic CHF (congestive heart failure) ([place]) 04/20/2021   Hypoalbuminemia due to protein-calorie malnutrition ([place]) 04/20/2021   GERD (gastroesophageal reflux disease) 04/20/2021   Candida esophagitis ([place])    Iron deficiency anemia due to chronic blood loss 01/11/2021   Blood in stool    Atrial fibrillation with RVR ([place]) 01/10/2021   Hypomagnesemia 01/10/2021   Severe Hypophosphatemia 12/28/2020   Non-Sustained V-tach in the setting of Severe Electrolyte Derangement 12/28/2020   Cocaine abuse -on-going 12/28/2020   Alcohol abuse, continuous 12/28/2020   Syncope and collapse-suspect arrhythmia related in the setting of severe electrolyte  abnormalities 12/28/2020   Type 2 diabetes mellitus with complication, with long-term current use of insulin ([place]) 12/28/2020   Tobacco abuse 12/28/2020   Atrial fibrillation, chronic ([place]) 12/26/2020   Hypocalcemia 36/64/4034   Acute metabolic encephalopathy 74/25/9563   Hypoglycemia 12/05/2020   AKI (acute kidney injury) ([place]) 12/04/2020   Vitamin D deficiency 06/17/2019   Essential hypertension, benign 08/30/2018   Mixed hyperlipidemia 07/17/2018   Hyponatremia 02/14/2018   Hypokalemia 02/14/2018   Medically noncompliant 02/14/2018   S/P AKA (above knee amputation) bilateral ([place]) 02/12/2018   Current smoker 02/03/2018   Hyperglycemia due to diabetes mellitus ([place]) 02/03/2018   Leukocytosis 02/03/2018   Thrombocytosis 02/03/2018   Urinary incontinence 08/14/2017   Transaminitis    Acute respiratory failure with hypoxia (HCC)    Elevated troponin 07/04/2016   Hyperuricemia 06/21/2016   Essential hypertension 01/05/2016   Diabetes type 2, uncontrolled 01/05/2016   Acquired hypothyroidism 05/10/2011   PCP:  [name], DO Pharmacy:   [facility] [place] [address] Phone: [phone] Fax: [phone]  [id number] - [place], [place] - [place] [address] Phone: [phone] Fax: [place], [place] - [address] [place] [address] Phone: [phone] Fax: [phone]     Social Determinants of Health (SDOH) Interventions    Readmission Risk Interventions     No data to display

## 2022-05-10 NOTE — Progress Notes (Signed)
   Heart Failure Stewardship Pharmacist Progress Note   PCP: [name], DO PCP-Cardiologist: Minus Breeding, MD    HPI:   Todd [name] is a 66 y.o. male with a PMH of pAF on eliquis, CMO (LVEF 45-50% 04/2021), sustained VT, CKD stage 3b, prolonged Qtc, wheelchair bound, s/p bilateral AKA, T2DM,  peripheral arterial disease, hypertension, hyperlipidemia, chronic lower back pain and hx of polysubstance abuse, who presented with wide-complex tachycardia. Patient underwent CMR on 07/22/21 that showed LVEF of 42% and suggested follow-up PET scan to evaluate for sarcoidosis in the setting of VT. Echocardiogram 11/22 showed LVEF of 45-50% with mild concentric hypertrophy, grade II diastolic dysfunction, and normal RV function. On 11/27, patient underwent left heart cath that showed 65% stenosis of the mid-LAD and otherwise mild non-obstructive disease.  Current HF Medications: Beta Blocker: Toprol XL 100 mg QD Other: Imdur 60 mg daily, hydralazine 50 mg TID  Prior to admission HF Medications: Beta blocker: Toprol XL 100 mg QD Other: Imdur 60 mg daily, hydralazine 25 mg TID  Pertinent Lab Values: Serum creatinine 2.28, BUN 27, Potassium 5.5, Sodium 137, Magnesium 1.6, A1c 5.6  Vital Signs: Weight: 149 lbs (admission weight: 149.5 lbs) Blood pressure: 120-150/70s  Heart rate: 50-60s  I/O: -0.3L yesterday; net -1.8L  Medication Assistance / Insurance Benefits Check: Does the patient have prescription insurance?  Yes Type of insurance plan: Medicare  Outpatient Pharmacy:  Prior to admission outpatient pharmacy: [facility] Is the patient willing to use [facility] pharmacy at discharge? Yes Is the patient willing to transition their outpatient pharmacy to utilize a [facility] outpatient pharmacy?   Pending    Assessment: 1. Acute on chronic systolic CHF (LVEF 95-28%), due to uncertain etiology. NYHA class I-II symptoms. - BP is stable, if BP remains >120, can consider  increasing hydralazine to 75 mg TID - Creatinine up after cath. Potassium is elevated with minimal urine output today, will monitor for now. Magnesium is low at 1.6. Strict I/Os and daily weights. If CrCl improves, can consider SGLT2i for stable CKD and HF. Renal function will not allow other GDMT at this time. - Euvolemic on exam and asymptomatic. Hold diuretics.   Plan: 1) Medication changes recommended at this time: - Give magnesium 2g IV   2) Patient assistance: - Will perform copay checks tomorrow  3)  Education  - To be completed prior to discharge  Thank you for allowing pharmacy to participate in this patient's care.  Reatha Harps, PharmD PGY2 Pharmacy Resident 05/10/2022 2:41 PM Check [email or web address] for unit specific pharmacy number

## 2022-05-10 NOTE — Evaluation (Signed)
Physical Therapy One-Time Evaluation Patient Details Name: Todd [name] MRN: [id number] DOB: 1956-05-14 Today's Date: 05/10/2022  History of Present Illness  Todd [name] is a 66 y.o. male with a PMHx of pAF on eliquis, CMO (LVEF 45-50% 04/2021), sustained VT, CKD stage 3b, prolonged Qtc, wheelchair bound, s/p bilateral AKA, T2DM,  peripheral arterial disease, hypertension, hyperlipidemia, chronic lower back pain and hx of polysubstance abuse, admitted with wide-complex tachycardia.  Clinical Impression  Patient presents with mobility close to baseline.  Reports has help to get to wheelchair at facility and has to be in "just the right place" to be able to help with transfers.  Today focus on assessment and balance while on EOB.  Needing max A to attain upright at EOB and initial mod A progressing to minguard for balance on EOB.  Feel he may benefit at facility to work on balance and mobility.  No further skilled acute level PT indicated as close to recent baseline.  Anticipate home to facility soon.        Recommendations for follow up therapy are one component of a multi-disciplinary discharge planning process, led by the attending physician.  Recommendations may be updated based on patient status, additional functional criteria and insurance authorization.  Follow Up Recommendations Skilled nursing-short term rehab (<3 hours/day) Can patient physically be transported by private vehicle: No    Assistance Recommended at Discharge Frequent or constant Supervision/Assistance  Patient can return home with the following  Two people to help with walking and/or transfers;Assist for transportation;Help with stairs or ramp for entrance;A lot of help with bathing/dressing/bathroom    Equipment Recommendations None recommended by PT  Recommendations for Other Services       Functional Status Assessment Patient has not had a recent decline in their functional status     Precautions /  Restrictions Precautions Precautions: Fall Precaution Comments: B AKA Required Braces or Orthoses: Other Brace Other Brace: L wrist splint from home      Mobility  Bed Mobility Overal bed mobility: Needs Assistance Bed Mobility: Supine to Sit, Sit to Sidelying     Supine to sit: HOB elevated, Max assist, +2 for safety/equipment   Sit to sidelying: Min assist General bed mobility comments: using bed rails to lift trunk and cues for scooting hips, but difficulty so pulled around to EOB with bed pad under pt with pt using bed rails; assist to L elbow to sidelying with cues and A for balance    Transfers                   General transfer comment: OOB NT but needing max A of 2 for lateral scoot toward HOB with bed pad underneath    Ambulation/Gait                  Stairs            Wheelchair Mobility    Modified Rankin (Stroke Patients Only)       Balance Overall balance assessment: Needs assistance Sitting-balance support: Bilateral upper extremity supported Sitting balance-Leahy Scale: Poor Sitting balance - Comments: initially mod A for balance at EOB progressing to minguard with cues for hand placement and                                     Pertinent Vitals/Pain Pain Assessment Pain Assessment: No/denies pain    Home Living Family/patient expects to  be discharged to:: Skilled nursing facility (LTC resident at [facility])                        Prior Function               Mobility Comments: lifted physically to wheelchair and propels wheelchair on his own       Hand Dominance   Dominant Hand: Right    Extremity/Trunk Assessment   Upper Extremity Assessment Upper Extremity Assessment: Generalized weakness    Lower Extremity Assessment Lower Extremity Assessment: RLE deficits/detail;LLE deficits/detail RLE Deficits / Details: B AKA with hip ROM grossly WFL though extension not formally  tested LLE Deficits / Details: B AKA with hip ROM grossly WFL though extension not formally tested       Communication   Communication: No difficulties  Cognition Arousal/Alertness: Awake/alert Behavior During Therapy: WFL for tasks assessed/performed Overall Cognitive Status: Within Functional Limits for tasks assessed                                 General Comments: not formally tested, [facility] for mobility        General Comments General comments (skin integrity, edema, etc.): sister in the room; NT assisting with mobility and washing his back while sitting    Exercises     Assessment/Plan    PT Assessment All further PT needs can be met in the next venue of care  PT Problem List Decreased balance;Decreased mobility       PT Treatment Interventions      PT Goals (Current goals can be found in the Care Plan section)  Acute Rehab PT Goals PT Goal Formulation: All assessment and education complete, DC therapy    Frequency       Co-evaluation               AM-PAC PT "6 Clicks" Mobility  Outcome Measure Help needed turning from your back to your side while in a flat bed without using bedrails?: A Lot Help needed moving from lying on your back to sitting on the side of a flat bed without using bedrails?: Total Help needed moving to and from a bed to a chair (including a wheelchair)?: Total Help needed standing up from a chair using your arms (e.g., wheelchair or bedside chair)?: Total Help needed to walk in hospital room?: Total Help needed climbing 3-5 steps with a railing? : Total 6 Click Score: 7    End of Session   Activity Tolerance: Patient tolerated treatment well Patient left: in bed;with nursing/sitter in room   PT Visit Diagnosis: Other abnormalities of gait and mobility (R26.89)    Time: 9509-3267 PT Time Calculation (min) (ACUTE ONLY): 21 min   Charges:   PT Evaluation $PT Eval Moderate Complexity: 1 Mod          Cyndi  Shivan Hodes, PT Acute Rehabilitation Services Office:[phone] 05/10/2022   Reginia Naas 05/10/2022, 2:34 PM

## 2022-05-10 NOTE — Progress Notes (Signed)
CARDIAC REHAB PHASE I    Pt resting in bed feeling well today. Post stent education including site care, heart healthy diabetic diet, exercise guidelines, antiplatelet therapy, risk factors, restrictions and CRP2 reviewed. All questions and concerns addressed. Will refer to Ap for CRP2. Will continue to follow.   [id number]  Vanessa Barbara, RN BSN 05/10/2022 8:51 AM

## 2022-05-10 NOTE — Progress Notes (Signed)
Cardiology Progress Note  Patient ID: Todd [name] MRN: [id number] DOB: 1955/07/19 Date of Encounter: 05/10/2022  Primary Cardiologist: Minus Breeding, MD  Subjective   Chief Complaint: None.   HPI: Absent R radial pulse. No complaints.   ROS:  All other ROS reviewed and negative. Pertinent positives noted in the HPI.     Inpatient Medications  Scheduled Meds:  allopurinol  100 mg Oral Daily   ascorbic acid  500 mg Oral BID   aspirin  81 mg Oral Daily   atorvastatin  40 mg Oral Daily   cholecalciferol  1,000 Units Oral Daily   clopidogrel  75 mg Oral Q breakfast   heparin  5,000 Units Subcutaneous Q8H   hydrALAZINE  50 mg Oral Q8H   insulin aspart  0-9 Units Subcutaneous TID WC   isosorbide mononitrate  60 mg Oral Daily   levothyroxine  100 mcg Oral Daily   lidocaine  1 patch Transdermal Q24H   megestrol  400 mg Oral BID   metoprolol succinate  100 mg Oral Daily   mexiletine  150 mg Oral Q8H   mirtazapine  7.5 mg Oral QHS   multivitamin with minerals  1 tablet Oral Daily   pantoprazole  40 mg Oral Daily   pneumococcal 20-valent conjugate vaccine  0.5 mL Intramuscular Tomorrow-1000   polyethylene glycol  17 g Oral Daily   potassium chloride SA  20 mEq Oral Daily   sertraline  50 mg Oral Daily   sodium bicarbonate  650 mg Oral TID   sodium chloride flush  3 mL Intravenous Q12H   sodium chloride flush  3 mL Intravenous Q12H   sodium chloride flush  3 mL Intravenous Q12H   thiamine  100 mg Oral Daily   tiZANidine  2 mg Oral TID   cyanocobalamin  500 mcg Oral Daily   Continuous Infusions:  sodium chloride     PRN Meds: sodium chloride, acetaminophen, cyclobenzaprine, diclofenac Sodium, mouth rinse, sodium chloride flush   Vital Signs   Vitals:   05/09/22 1507 05/09/22 1937 05/10/22 0524 05/10/22 0751  BP: 130/76 (!) 143/89 (!) 154/81 138/72  Pulse: (!) 55 61 (!) 58 (!) 58  Resp: '12 20 17 18  '$ Temp: 98 F (36.7 C) 98.3 F (36.8 C) 98.2 F (36.8 C) 98.4 F  (36.9 C)  TempSrc: Oral Oral Oral Oral  SpO2: 100% 100% 100% 100%  Weight:      Height:        Intake/Output Summary (Last 24 hours) at 05/10/2022 0841 Last data filed at 05/10/2022 0525 Gross per 24 hour  Intake 522.5 ml  Output 1000 ml  Net -477.5 ml      05/09/2022    5:12 AM 05/04/2022   11:00 PM 05/03/2022   10:45 PM  Last 3 Weights  Weight (lbs) 149 lb 0.5 oz 107 lb 9.4 oz 100 lb 15.5 oz  Weight (kg) 67.6 kg 48.8 kg 45.8 kg      Telemetry  Overnight telemetry shows SR 60s, which I personally reviewed.   ECG  The most recent ECG shows SB 58 bpm, no acute changes, which I personally reviewed.   Physical Exam   Vitals:   05/09/22 1507 05/09/22 1937 05/10/22 0524 05/10/22 0751  BP: 130/76 (!) 143/89 (!) 154/81 138/72  Pulse: (!) 55 61 (!) 58 (!) 58  Resp: '12 20 17 18  '$ Temp: 98 F (36.7 C) 98.3 F (36.8 C) 98.2 F (36.8 C) 98.4 F (36.9 C)  TempSrc: Oral  Oral Oral Oral  SpO2: 100% 100% 100% 100%  Weight:      Height:        Intake/Output Summary (Last 24 hours) at 05/10/2022 0841 Last data filed at 05/10/2022 0525 Gross per 24 hour  Intake 522.5 ml  Output 1000 ml  Net -477.5 ml       05/09/2022    5:12 AM 05/04/2022   11:00 PM 05/03/2022   10:45 PM  Last 3 Weights  Weight (lbs) 149 lb 0.5 oz 107 lb 9.4 oz 100 lb 15.5 oz  Weight (kg) 67.6 kg 48.8 kg 45.8 kg    Body mass index is 29.11 kg/m.  General: Well nourished, well developed, in no acute distress Head: Atraumatic, normal size  Eyes: PEERLA, EOMI  Neck: Supple, no JVD Endocrine: No thryomegaly Cardiac: Normal S1, S2; RRR; no murmurs, rubs, or gallops Lungs: Clear to auscultation bilaterally, no wheezing, rhonchi or rales  Abd: Soft, nontender, no hepatomegaly  Ext: Bilateral AKA, absent right radial pulse Musculoskeletal: Bilateral AKA Skin: Warm and dry, no rashes   Neuro: Alert and oriented to person, place, time, and situation, CNII-XII grossly intact, no focal deficits  Psych:  Normal mood and affect   Labs  High Sensitivity Troponin:   Recent Labs  Lab 05/03/22 1419 05/03/22 1755  TROPONINIHS 30* 73*     Cardiac EnzymesNo results for input(s): "TROPONINI" in the last 168 hours. No results for input(s): "TROPIPOC" in the last 168 hours.  Chemistry Recent Labs  Lab 05/05/22 0324 05/06/22 0416 05/08/22 0117 05/09/22 0121 05/10/22 0307  NA 140   < > 138 138 137  K 4.1   < > 4.4 4.5 5.5*  CL 104   < > 111 108 109  CO2 21*   < > 18* 16* 18*  GLUCOSE 91   < > 319* 295* 110*  BUN 21   < > 29* 29* 27*  CREATININE 2.24*   < > 2.46* 2.07* 2.28*  CALCIUM 7.3*   < > 7.2* 7.7* 8.2*  PROT 6.7  --   --   --   --   ALBUMIN 2.4*  --   --   --   --   AST 21  --   --   --   --   ALT 11  --   --   --   --   ALKPHOS 168*  --   --   --   --   BILITOT 0.7  --   --   --   --   GFRNONAA 31*   < > 28* 34* 31*  ANIONGAP 15   < > '9 14 10   '$ < > = values in this interval not displayed.    Hematology Recent Labs  Lab 05/04/22 0305 05/05/22 0324 05/10/22 0307  WBC 9.7 12.2* 14.2*  RBC 4.23 4.21* 3.97*  HGB 11.5* 11.8* 11.1*  HCT 35.5* 35.4* 33.1*  MCV 83.9 84.1 83.4  MCH 27.2 28.0 28.0  MCHC 32.4 33.3 33.5  RDW 15.6* 15.9* 15.1  PLT 548* 509* 575*   BNPNo results for input(s): "BNP", "PROBNP" in the last 168 hours.  DDimer No results for input(s): "DDIMER" in the last 168 hours.   Radiology  CARDIAC CATHETERIZATION  Result Date: 05/09/2022 Conclusions: Significant single-vessel coronary artery disease with 60-70% mid LAD stenosis that is hemodynamically significant (RFR = 0.87).  There is mild, non-obstructive disease in the mid LCx and RCA. Normal left ventricular filling pressure (LVEDP  10 mmHg). Successful RFR-guided PCI to mid LAD using synergy 2.75 x 16 mm drug-eluting stent (postdilated to 3.1 mm) with 0% residual stenosis and TIMI-3 flow. Recommendations: Dual antiplatelet therapy with aspirin and clopidogrel while off apixiban.  Anticipate restarting  apixaban 2.5 mg twice daily as soon as tomorrow if there is no evidence of bleeding/vascular injury following catheterization.  Anticipate apixiban + clopidogrel for up to 12 months. Aggressive secondary prevention of coronary artery disease. Further workup of ventricular tachycardia and possible sarcoidosis per primary team/EP. Nelva Bush, MD [facility]   Cardiac Studies  [facility] 05/09/2022 Conclusions: Significant single-vessel coronary artery disease with 60-70% mid LAD stenosis that is hemodynamically significant (RFR = 0.87).  There is mild, non-obstructive disease in the mid LCx and RCA. Normal left ventricular filling pressure (LVEDP 10 mmHg). Successful RFR-guided PCI to mid LAD using synergy 2.75 x 16 mm drug-eluting stent (postdilated to 3.1 mm) with 0% residual stenosis and TIMI-3 flow.  TTE 05/04/2022  1. Left ventricular ejection fraction, by estimation, is 45 to 50%. The  left ventricle has mildly decreased function. The left ventricle  demonstrates global hypokinesis. There is mild concentric left ventricular  hypertrophy. Left ventricular diastolic  parameters are consistent with Grade II diastolic dysfunction  (pseudonormalization).   2. Right ventricular systolic function is normal. The right ventricular  size is normal. Tricuspid regurgitation signal is inadequate for assessing  PA pressure.   3. Left atrial size was severely dilated.   4. Right atrial size was mild to moderately dilated.   5. The mitral valve is grossly normal. No evidence of mitral valve  regurgitation. No evidence of mitral stenosis.   6. The aortic valve was not well visualized. Aortic valve regurgitation  is not visualized.   7. The inferior vena cava is normal in size with greater than 50%  respiratory variability, suggesting right atrial pressure of 3 mmHg.   Patient Profile  66 year old male with history of atrial fibrillation, systolic heart failure (EF 45-50%, recurrent sustained  ventricular tachycardia, CKD stage IIIb, PAD status post bilateral AKA, diabetes, hypertension who was admitted on 05/03/2022 with sustained ventricular tachycardia.   Assessment & Plan   #Ventricular tachycardia #Systolic heart failure, EF 45-50% #Possible cardiac sarcoidosis #Obstructive CAD -70% mid LAD lesion found on cardiac cath.  Status post PCI.  Continue aspirin Plavix for 6 months. -Did have ventricular tachycardia.  We will defer ICD consideration since he just underwent PCI. -VT was monomorphic.  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  Would plan to pursue outpatient cardiac PET scan. -We will continue mexiletine 150 mg 3 times daily as recommended by EP.  He had QTc prolongation with amiodarone. -Regarding his cardiomyopathy would continue metoprolol succinate 100 mg daily.  On Imdur 60 mg daily.  On hydralazine 50 mg 3 times daily.  Not a candidate for ACE/ARB/ARNI/MRA given CKD 3B. -PT evaluation. Suspect will return to SNF.  -volume status acceptable. LVEDP normal.  -Plan for Eliquis and Plavix for 12 months.  Concerns for vascular injury.  Hold Eliquis for now.  See discussion below. -Continue high intensity statin.  #Absent right radial pulse -Status post radial cath yesterday.  Absent pulse today.  We will obtain a vascular ultrasound.  Hold Eliquis for now.  No symptoms.  Poor ulnar pulses well but given lack of symptoms suspect this would not be a major issue.  We will follow-up ultrasound studies.  #AKI #CKD IIIB -kidney function stable.  -close monitoring.   #DM -Reduce Lantus to 10 mg  daily.  Sign scale insulin.  #Paroxysmal atrial fibrillation -Maintaining sinus rhythm.  Holding Eliquis until vascular ultrasound is completed above.  Plan for Plavix and Eliquis.  #PAD status post bilateral AKA -Stable  FEN -No intravenous fluids -Code: Full -DVT Ppx: Subcutaneous heparin.  Plan to restart Eliquis as detailed above. -Diet: Heart healthy -Disposition: Anticipate  return to SNF.  For questions or updates, please contact [place] Please consult [email or web address] for contact info under     Signed, [place] B[place] T. Audie Box, MD, [place]  05/10/2022 8:41 AM

## 2022-05-10 NOTE — Progress Notes (Signed)
Upper extremity arterial right study completed.   Please see CV Proc for preliminary results.   Darlin Coco, RDMS, RVT

## 2022-05-10 NOTE — Inpatient Diabetes Management (Signed)
Inpatient Diabetes Program Recommendations  AACE/ADA: New Consensus Statement on Inpatient Glycemic Control (2015)  Target Ranges:  Prepandial:   less than 140 mg/dL      Peak postprandial:   less than 180 mg/dL (1-2 hours)      Critically ill patients:  140 - 180 mg/dL   Lab Results  Component Value Date   GLUCAP 106 (H) 05/10/2022   HGBA1C 5.6 01/11/2022    Review of Glycemic Control  Latest Reference Range & Units 05/09/22 06:01 05/09/22 12:28 05/09/22 18:22 05/09/22 21:06 05/10/22 07:49  Glucose-Capillary 70 - 99 mg/dL 136 (H) 112 (H) 108 (H) 97 106 (H)   Diabetes history: DM 2 Outpatient Diabetes medications: Lantus 16 units qhs, Humalog 0-6 units tid Current orders for Inpatient glycemic control:  Semglee 10 units Daily Novolog 0-9 units tid  Consult: assistance with insulin  Inpatient Diabetes Program Recommendations:    Trends lower at goal with the addition of Semglee. Will watch glucose trends for now.   Thanks,  Tama Headings RN, MSN, BC-ADM Inpatient Diabetes Coordinator Team Pager [phone] (8a-5p)

## 2022-05-10 NOTE — Progress Notes (Signed)
Heart Failure Navigator Progress Note  Assessed for Heart & Vascular TOC clinic readiness.  Patient EF 40-45%, has a scheduled hospital follow up with [facility] on 05/17/22..   Navigator will sign off at this time.   Earnestine Leys, BSN, Clinical cytogeneticist Only

## 2022-05-11 DIAGNOSIS — I472 Ventricular tachycardia, unspecified: Secondary | ICD-10-CM | POA: Diagnosis not present

## 2022-05-11 DIAGNOSIS — N179 Acute kidney failure, unspecified: Secondary | ICD-10-CM | POA: Diagnosis not present

## 2022-05-11 DIAGNOSIS — I482 Chronic atrial fibrillation, unspecified: Secondary | ICD-10-CM | POA: Diagnosis not present

## 2022-05-11 DIAGNOSIS — I5021 Acute systolic (congestive) heart failure: Secondary | ICD-10-CM | POA: Diagnosis not present

## 2022-05-11 DIAGNOSIS — I4819 Other persistent atrial fibrillation: Secondary | ICD-10-CM

## 2022-05-11 LAB — BASIC METABOLIC PANEL
Anion gap: 8 (ref 5–15)
BUN: 33 mg/dL — ABNORMAL HIGH (ref 8–23)
CO2: 14 mmol/L — ABNORMAL LOW (ref 22–32)
Calcium: 7.7 mg/dL — ABNORMAL LOW (ref 8.9–10.3)
Chloride: 116 mmol/L — ABNORMAL HIGH (ref 98–111)
Creatinine, Ser: 2.65 mg/dL — ABNORMAL HIGH (ref 0.61–1.24)
GFR, Estimated: 26 mL/min — ABNORMAL LOW (ref >60)
Glucose, Bld: 197 mg/dL — ABNORMAL HIGH (ref 70–99)
Potassium: 5 mmol/L (ref 3.5–5.1)
Sodium: 138 mmol/L (ref 135–145)

## 2022-05-11 LAB — GLUCOSE, CAPILLARY
Glucose-Capillary: 153 mg/dL — ABNORMAL HIGH (ref 70–99)
Glucose-Capillary: 145 mg/dL — ABNORMAL HIGH (ref 70–99)

## 2022-05-11 LAB — LIPOPROTEIN A (LPA): Lipoprotein (a): 123.4 nmol/L — ABNORMAL HIGH (ref <75.0)

## 2022-05-11 LAB — MAGNESIUM: Magnesium: 2.6 mg/dL — ABNORMAL HIGH (ref 1.7–2.4)

## 2022-05-11 MED ORDER — MEXILETINE HCL 150 MG PO CAPS: 150.0000 mg | ORAL_CAPSULE | Freq: Three times a day (TID) | ORAL | Status: AC

## 2022-05-11 MED ORDER — CLOPIDOGREL BISULFATE 75 MG PO TABS: 75.0000 mg | ORAL_TABLET | Freq: Every day | ORAL | Status: DC

## 2022-05-11 MED ORDER — ATORVASTATIN CALCIUM 80 MG PO TABS: 80.0000 mg | ORAL_TABLET | Freq: Every day | ORAL | Status: DC

## 2022-05-11 MED ORDER — APIXABAN 5 MG PO TABS
5.0000 mg | ORAL_TABLET | Freq: Two times a day (BID) | ORAL | Status: DC
  Administered 2022-05-11: 5 mg via ORAL
  Filled 2022-05-11: qty 1

## 2022-05-11 MED ORDER — ISOSORBIDE MONONITRATE ER 60 MG PO TB24: 60.0000 mg | ORAL_TABLET | Freq: Every day | ORAL | Status: DC

## 2022-05-11 MED ORDER — HYDRALAZINE HCL 50 MG PO TABS: 50.0000 mg | ORAL_TABLET | Freq: Three times a day (TID) | ORAL | Status: DC

## 2022-05-11 NOTE — TOC Progression Note (Addendum)
Transition of Care [facility]) - Progression Note    Patient Details  Name: Todd [name] MRN: [id number] Date of Birth: [birth year]  Transition of Care [facility]) CM/SW [place], [place] Phone Number: 05/11/2022, 10:10 AM  Clinical Narrative:     CSW spoke with Debbie with [facility] who confirmed they can accept patient back today and that patient can receive short term rehab when he returns.Debbie confirmed no insurance authorization needed. CSW informed MD.CSW spoke with patient and patient is agreeable to return to [facility] and receive short term rehab. CSW will continue to follow and assist with patients dc planning needs.  Expected Discharge Plan: [place] Barriers to Discharge: Continued Medical Work up  Expected Discharge Plan and Services Expected Discharge Plan: [place] In-house Referral: Clinical Social Work     Living arrangements for the past 2 months: [place]                                       Social Determinants of Health (SDOH) Interventions    Readmission Risk Interventions     No data to display

## 2022-05-11 NOTE — Progress Notes (Signed)
Report called to SNF

## 2022-05-11 NOTE — Progress Notes (Signed)
   Heart Failure Stewardship Pharmacist Progress Note   PCP: [name], DO PCP-Cardiologist: Minus Breeding, MD    HPI:   Todd [name] is a 66 y.o. male with a PMH of pAF on eliquis, CMO (LVEF 45-50% 04/2021), sustained VT, CKD stage 3b, prolonged Qtc, wheelchair bound, s/p bilateral AKA, T2DM,  peripheral arterial disease, hypertension, hyperlipidemia, chronic lower back pain and hx of polysubstance abuse, who presented with wide-complex tachycardia. Patient underwent CMR on 07/22/21 that showed LVEF of 42% and suggested follow-up PET scan to evaluate for sarcoidosis in the setting of VT. Echocardiogram 11/22 showed LVEF of 45-50% with mild concentric hypertrophy, grade II diastolic dysfunction, and normal RV function. On 11/27, patient underwent left heart cath that showed 65% stenosis of the mid-LAD and otherwise mild non-obstructive disease.  Current HF Medications: Beta Blocker: Toprol XL 100 mg QD Other: Imdur 60 mg daily, hydralazine 50 mg TID  Prior to admission HF Medications: Beta blocker: Toprol XL 100 mg QD Other: Imdur 60 mg daily, hydralazine 25 mg TID  Pertinent Lab Values: Serum creatinine 2.28, BUN 33, Potassium 5, Sodium 138, Magnesium 2.6, A1c 5.6  Vital Signs: Weight: no wt today (admission weight: 149.5 lbs) Blood pressure: 130-170/70s  Heart rate: 50-60s  I/O: 0.7L yesterday; net -0.1L  Medication Assistance / Insurance Benefits Check: Does the patient have prescription insurance?  Yes Type of insurance plan: Medicare  Outpatient Pharmacy:  Prior to admission outpatient pharmacy: [facility] Is the patient willing to use [facility] pharmacy at discharge? Yes Is the patient willing to transition their outpatient pharmacy to utilize a [facility] outpatient pharmacy?   Pending    Assessment: 1. Acute on chronic systolic CHF (LVEF 47-65%), due to uncertain etiology. NYHA class I-II symptoms. - BP is elevated, consider increasing hydralazine to  75 mg TID - Creatinine up after cath. Potassium is declining, urine output is still minimal. Strict I/Os and daily weights. If CrCl improves, can consider SGLT2i for CKD and HF. Renal function will not allow other GDMT at this time. - Euvolemic on exam and asymptomatic. Hold diuretics.   Plan: 1) Medication changes recommended at this time: - Increase hydralazine to 75 mg q8h  2) Patient assistance: - Will perform copay checks tomorrow  3)  Education  - To be completed prior to discharge  Thank you for allowing pharmacy to participate in this patient's care.  Reatha Harps, PharmD PGY2 Pharmacy Resident 05/11/2022 8:25 AM Check [email or web address] for unit specific pharmacy number

## 2022-05-11 NOTE — Care Management Important Message (Signed)
Important Message  Patient Details  Name: Todd [name] MRN: [id number] Date of Birth: [birth year]   Medicare Important Message Given:  Yes     [name] 05/11/2022, 10:36 AM

## 2022-05-11 NOTE — Discharge Summary (Signed)
Discharge Summary    Patient ID: Todd [name] MRN: [id number]; DOB: [birth year]  Admit date: 05/03/2022 Discharge date: 05/11/2022  PCP:  [name], [place] [facility] Cardiologist:  Minus Breeding, MD  Electrophysiologist:  [name], MD    Discharge Diagnoses    Principal Problem:   Ventricular tachyarrhythmia [facility]) Active Problems:   AKI (acute kidney injury) ([place])   Atrial fibrillation, chronic ([place])   Type 2 diabetes mellitus with complication, with long-term current use of insulin (H[facility])   CHF (congestive heart failure) ([place])   Cardiomyopathy ([place])   Persistent atrial fibrillation ([place])   Stage 3b chronic kidney disease (CKD) ([place])   VT (ventricular tachycardia) ([place])   Acute systolic heart failure ([place])    Diagnostic Studies/Procedures    Echocardiogram 05/04/2022 1. Left ventricular ejection fraction, by estimation, is 45 to 50%. The  left ventricle has mildly decreased function. The left ventricle  demonstrates global hypokinesis. There is mild concentric left ventricular  hypertrophy. Left ventricular diastolic  parameters are consistent with Grade II diastolic dysfunction  (pseudonormalization).   2. Right ventricular systolic function is normal. The right ventricular  size is normal. Tricuspid regurgitation signal is inadequate for assessing  PA pressure.   3. Left atrial size was severely dilated.   4. Right atrial size was mild to moderately dilated.   5. The mitral valve is grossly normal. No evidence of mitral valve  regurgitation. No evidence of mitral stenosis.   6. The aortic valve was not well visualized. Aortic valve regurgitation  is not visualized.   7. The inferior vena cava is normal in size with greater than 50%  respiratory variability, suggesting right atrial pressure of 3 mmHg.   Comparison(s): No significant change from prior study. Prior images  reviewed side by side.   Left Heart  Catheterization 05/09/2022 Conclusions: Significant single-vessel coronary artery disease with 60-70% mid LAD stenosis that is hemodynamically significant (RFR = 0.87).  There is mild, non-obstructive disease in the mid LCx and RCA. Normal left ventricular filling pressure (LVEDP 10 mmHg). Successful RFR-guided PCI to mid LAD using synergy 2.75 x 16 mm drug-eluting stent (postdilated to 3.1 mm) with 0% residual stenosis and TIMI-3 flow.   Recommendations: Dual antiplatelet therapy with aspirin and clopidogrel while off apixiban.  Anticipate restarting apixaban 2.5 mg twice daily as soon as tomorrow if there is no evidence of bleeding/vascular injury following catheterization.  Anticipate apixiban + clopidogrel for up to 12 months. Aggressive secondary prevention of coronary artery disease. Further workup of ventricular tachycardia and possible sarcoidosis per primary team/EP. Diagnostic Dominance: Right  Intervention   Vas Upper Extremity Artery Duplex Right 05/10/2022 Summary:    Right: Turbulent waveforms in the proximal subclavian artery         suggestive of more proximal obstruction. Dampened waveforms         in the mid to distal subclavian artery consistent with         proximal obstruction. Radial artery is patent with some         degree of calcification and dampened waveforms as compared to         the ulnar artery.  _____________   History of Present Illness     Todd [name] is a 66 y.o. male with a past medical history of paroxysmal atrial fibrillation on eliquis, cardiomyopathy (EF 45-50% in 04/2021), history of sustained VT, CKD stage IIIb, prolonged Qtc, wheelchair bound, s/p bilateral AKA, type 2 DM, peripheral  arterial disease, HTN, HLD, chronic lower back pain and history of polysubstance abuse. Patient was seen by cardiology on 05/03/2022 in the ED for evaluation of wide-complex tachycardia.   Patient is a resident in a nursing home. He was found to have a  "shaking episode" on 11/21 that lasted less than 5 minutes. Patient denied having any severe symptoms and described the event as a very brief episode of shortness of breath. He was taken to the local EF where he was found to have wide-complex tachycardia. He received one DCCV and converted to normal sinus rhythm. He was taken to [facility] and was in sinus rhythm. When cardiology saw the patient, he was on room air, in no acute distress, and complained only of back pain. He denied fever, chills, dizziness, syncope, chest discomfort, heart palpitations, nausea, vomiting, abdominal fullness, dysuria, diarrhea, pedal edema, or bleeding events.   Patient reported that he had stopped taking amiodarone several months ago. Per chart review, amiodarone was stopped in March because of concerns about prolonged QT.    Hospital Course     Consultants: Electrophysiology     Sustained Ventricular tachycardia  Systolic Heart Failure, EF 45-50% Cardiac MRI with concerns for cardiac sarcoidosis  Obstructive CAD  - Patient had a 5 minute episode of shakiness and was taken to the local ED from his living facility on 11/21. Found to have a wide complex tachycardia, underwent DCCV at outside hospital. When he arrived at  [facility], he was in sinus rhythm  - Patient was admitted to the cardiology service and was seen by EP on 11/22. Patient was started on mexiletine. EP considered ICD, but ultimately patient was not a candidate due to his history of polysubstance abuse. Can reevaluate as an outpatient as needed  - Cardiac catheterization on 11/27 showed significant single-vessel CAD with 60-70% stenosis in the mid LAD that was hemodynamically significant. Underwent successful RFR-guided PCI to the mid LAD with DES  - Continue plavix 75 mg daily and eliquis 5 mg BID for at least 12 months following PCI. Continue statin therapy  - For his cardiomyopathy-- continue metoprolol succinate 100 mg daily, Imdur 60 mg daily, hydralazine 50 mg  TID. GDMT limited by CKD. Volume status is acceptable and LVEDP is normal - Continue mexiletine 150 mg TID as recommended by EP. Not a candidate for amiodarone due to QT prolongation  - VT was monomorphic. Note that Cardiac MRI from 07/2021 had shown patchy LGE concerning for cardiac sarcoidosis. Patient has not had a PET scan yet. He will need this as an outpatient   AKI on CKD stage IIIB - Patient has a baseline creatinine around 2.0-2.4. Creatinine peaked at 2.65 this admission  - Overall, renal function is stable. Will need BMP at follow up appointment  - Continue to avoid nephrotoxic medications, stop lasix at discharge given AKI, normal volume status (patient also reported he was not taking lasix at home)   Right Subclavian Artery Stenosis  PAD s/p bilateral AKA  - After cath, it was noted that patient had a week right radial pulse. Underwent upper extremity duplex on 11/28 that showed turbulent waveforms in the proximal subclavian artery suggestive of a proximal obstruction. Radial artery was patent with some degree of calcification  - If patient needs a cardiac catheterization in the future, he would not be a candidate for right radial access  - Otherwise, PAD is stable  - Continue high intensity statin and plavix   Type 2 DM  - Patient was  on sliding scale insulin and was seen by the diabetes coordinator this admission  - Resume home regiment at discharge - Recommend patient follows up with PCP for further diabetes management. He has an appointment with endocrinology for diabetes f/u on 12/1  Paroxysmal Atrial Fibrillation  - Maintaining normal sinus rhythm - Continue eliquis 5 mg BID, metoprolol succinate 100 mg daily   HLD  - Lipitor was increased from 40 mg daily to 80 mg daily  - Needs LFTs and Lipid panel in 8 weeks   Did the patient have an acute coronary syndrome (MI, NSTEMI, STEMI, etc) this admission?:  No                               Did the patient have a  percutaneous coronary intervention (stent / angioplasty)?:  Yes.     Cath/PCI Registry Performance & Quality Measures: Aspirin prescribed? - No - patient is on eliquis, plabvix  ADP Receptor Inhibitor (Plavix/Clopidogrel, Brilinta/Ticagrelor or Effient/Prasugrel) prescribed (includes medically managed patients)? - Yes High Intensity Statin (Lipitor 40-97m or Crestor 20-426m prescribed? - Yes For EF <40%, was ACEI/ARB prescribed? - Not Applicable (EF >/= 4062%For EF <40%, Aldosterone Antagonist (Spironolactone or Eplerenone) prescribed? - Not Applicable (EF >/= 4083%Cardiac Rehab Phase II ordered? - Yes      Patient was seen and examined by Dr. O'Audie Boxand was deemed stable prior to DC. Discussed with social work who ensured patient will be able to return to his SNF at discharge.   [facility]. Has an appointment with    _____________  Discharge Vitals Blood pressure (!) 176/83, pulse 64, temperature 98.2 F (36.8 C), temperature source Oral, resp. rate 16, height 5' (1.524 m), weight 67.6 kg, SpO2 100 %.  Filed Weights   05/03/22 2245 05/04/22 2300 05/09/22 0512  Weight: 45.8 kg 48.8 kg 67.6 kg    Labs & Radiologic Studies    CBC Recent Labs    05/10/22 0307  WBC 14.2*  HGB 11.1*  HCT 33.1*  MCV 83.4  PLT 57151  Basic Metabolic Panel Recent Labs    05/10/22 0307 05/11/22 0324  NA 137 138  K 5.5* 5.0  CL 109 116*  CO2 18* 14*  GLUCOSE 110* 197*  BUN 27* 33*  CREATININE 2.28* 2.65*  CALCIUM 8.2* 7.7*  MG 1.6* 2.6*   Liver Function Tests No results for input(s): "AST", "ALT", "ALKPHOS", "BILITOT", "PROT", "ALBUMIN" in the last 72 hours. No results for input(s): "LIPASE", "AMYLASE" in the last 72 hours. High Sensitivity Troponin:   Recent Labs  Lab 05/03/22 1419 05/03/22 1755  TROPONINIHS 30* 73*    BNP Invalid input(s): "POCBNP" D-Dimer No results for input(s): "DDIMER" in the last  72 hours. Hemoglobin A1C No results for input(s): "HGBA1C" in the last 72 hours. Fasting Lipid Panel No results for input(s): "CHOL", "HDL", "LDLCALC", "TRIG", "CHOLHDL", "LDLDIRECT" in the last 72 hours. Thyroid Function Tests No results for input(s): "TSH", "T4TOTAL", "T3FREE", "THYROIDAB" in the last 72 hours.  Invalid input(s): "FREET3" _____________  VAS [place]PPER EXTREMITY ARTERIAL DUPLEX  Result Date: 05/10/2022  UPPER EXTREMITY DUPLEX STUDY Patient Name:  Todd DEMICCODate of Exam:   05/10/2022 Medical Rec #: [id number]      Accession #:    [id number] of Birth: 11May 11, 1957     Patient Gender: M Patient  Age:   42 years Exam Location:  [facility] Procedure:      VAS [place] UPPER EXTREMITY ARTERIAL DUPLEX Referring Phys: [place] O'NEAL --------------------------------------------------------------------------------  Indications: Absent right radial pulse on clinical exam. History:     Patient has a history of catheterization via right radial artery.  Risk Factors:  Hypertension, hyperlipidemia, Diabetes, current smoker, coronary                artery disease. Other Factors: Bilateral AKA Comparison Study: No prior studies. Performing Technologist: Darlin Coco RDMS, RVT  Examination Guidelines: A complete evaluation includes B-mode imaging, spectral Doppler, color Doppler, and power Doppler as needed of all accessible portions of each vessel. Bilateral testing is considered an integral part of a complete examination. Limited examinations for reoccurring indications may be performed as noted.  Right Doppler Findings: +---------------+----------+---------+--------+--------------------------------+ Site           PSV (cm/s)Waveform StenosisComments                         +---------------+----------+---------+--------+--------------------------------+ Subclavian Prox191       biphasic         Turbulent- suggestive of more                                               proximal obstruction             +---------------+----------+---------+--------+--------------------------------+ Subclavian Mid 91        biphasic         Dampened                         +---------------+----------+---------+--------+--------------------------------+ Subclavian Dist65        biphasic         Dampened                         +---------------+----------+---------+--------+--------------------------------+ Axillary       154       biphasic                                          +---------------+----------+---------+--------+--------------------------------+ Brachial Prox  90        triphasic                                         +---------------+----------+---------+--------+--------------------------------+ Brachial Mid   71        triphasic                                         +---------------+----------+---------+--------+--------------------------------+ Brachial Dist  57        triphasic                                         +---------------+----------+---------+--------+--------------------------------+ Radial Prox    34        biphasic                                          +---------------+----------+---------+--------+--------------------------------+  Radial Mid     26        biphasic                                          +---------------+----------+---------+--------+--------------------------------+ Radial Dist    29        biphasic                                          +---------------+----------+---------+--------+--------------------------------+ Ulnar Prox     74        triphasic                                         +---------------+----------+---------+--------+--------------------------------+ Ulnar Mid      83        triphasic                                         +---------------+----------+---------+--------+--------------------------------+ Ulnar Dist     80        triphasic                                          +---------------+----------+---------+--------+--------------------------------+ Palmar Arch    59                                                          +---------------+----------+---------+--------+--------------------------------+    Left Doppler Findings: +---------------+----------+---------+--------+--------+ Site           PSV (cm/s)Waveform StenosisComments +---------------+----------+---------+--------+--------+ Subclavian Prox132       triphasic                 +---------------+----------+---------+--------+--------+   Summary:  Right: Turbulent waveforms in the proximal subclavian artery        suggestive of more proximal obstruction. Dampened waveforms        in the mid to distal subclavian artery consistent with        proximal obstruction. Radial artery is patent with some        degree of calcification and dampened waveforms as compared to        the ulnar artery. *See table(s) above for measurements and observations. Electronically signed by Servando Snare MD on 05/10/2022 at 7:02:00 PM.    Final    CARDIAC CATHETERIZATION  Result Date: 05/09/2022 Conclusions: Significant single-vessel coronary artery disease with 60-70% mid LAD stenosis that is hemodynamically significant (RFR = 0.87).  There is mild, non-obstructive disease in the mid LCx and RCA. Normal left ventricular filling pressure (LVEDP 10 mmHg). Successful RFR-guided PCI to mid LAD using synergy 2.75 x 16 mm drug-eluting stent (postdilated to 3.1 mm) with 0% residual stenosis and TIMI-3 flow. Recommendations: Dual antiplatelet therapy with aspirin and clopidogrel while off apixiban.  Anticipate restarting apixaban 2.5 mg twice daily as soon as tomorrow if there is no evidence  of bleeding/vascular injury following catheterization.  Anticipate apixiban + clopidogrel for up to 12 months. Aggressive secondary prevention of coronary artery disease. Further workup of ventricular tachycardia  and possible sarcoidosis per primary team/EP. Nelva Bush, MD [facility]  ECHOCARDIOGRAM COMPLETE  Result Date: 05/04/2022    ECHOCARDIOGRAM REPORT   Patient Name:   Todd [name] Date of Exam: 05/04/2022 Medical Rec #:  [id number]       Height:       60.0 in Accession #:    [id number]      Weight:       101.0 lb Date of Birth:  Aug 13, 1955      BSA:          1.396 m Patient Age:    42 years        BP:           133/66 mmHg Patient Gender: M               HR:           61 bpm. Exam Location:  Inpatient Procedure: 2D Echo, Cardiac Doppler, Color Doppler and Intracardiac            Opacification Agent Indications:    Ventricular Tachycardia I47.2  History:        Patient has prior history of Echocardiogram examinations, most                 recent 04/20/2021. CHF and Cardiomyopathy, PAD,                 Arrythmias:Tachycardia and Atrial Fibrillation,                 Signs/Symptoms:Shortness of Breath; Risk Factors:Hypertension,                 Diabetes, Current Smoker and Dyslipidemia.  Sonographer:    Greer Pickerel Referring Phys: [id number] FAN YE  Sonographer Comments: Image acquisition challenging due to respiratory motion and Image acquisition challenging due to patient body habitus. IMPRESSIONS  1. Left ventricular ejection fraction, by estimation, is 45 to 50%. The left ventricle has mildly decreased function. The left ventricle demonstrates global hypokinesis. There is mild concentric left ventricular hypertrophy. Left ventricular diastolic parameters are consistent with Grade II diastolic dysfunction (pseudonormalization).  2. Right ventricular systolic function is normal. The right ventricular size is normal. Tricuspid regurgitation signal is inadequate for assessing PA pressure.  3. Left atrial size was severely dilated.  4. Right atrial size was mild to moderately dilated.  5. The mitral valve is grossly normal. No evidence of mitral valve regurgitation. No evidence of mitral stenosis.  6. The  aortic valve was not well visualized. Aortic valve regurgitation is not visualized.  7. The inferior vena cava is normal in size with greater than 50% respiratory variability, suggesting right atrial pressure of 3 mmHg. Comparison(s): No significant change from prior study. Prior images reviewed side by side. FINDINGS  Left Ventricle: Left ventricular ejection fraction, by estimation, is 45 to 50%. The left ventricle has mildly decreased function. The left ventricle demonstrates global hypokinesis. Definity contrast agent was given IV to delineate the left ventricular  endocardial borders. The left ventricular internal cavity size was normal in size. There is mild concentric left ventricular hypertrophy. Left ventricular diastolic parameters are consistent with Grade II diastolic dysfunction (pseudonormalization). Right Ventricle: The right ventricular size is normal. No increase in right ventricular wall thickness. Right ventricular systolic function is normal. Tricuspid regurgitation signal is inadequate  for assessing PA pressure. Left Atrium: Left atrial size was severely dilated. Right Atrium: Right atrial size was mild to moderately dilated. Pericardium: There is no evidence of pericardial effusion. Mitral Valve: The mitral valve is grossly normal. No evidence of mitral valve regurgitation. No evidence of mitral valve stenosis. Tricuspid Valve: The tricuspid valve is normal in structure. Tricuspid valve regurgitation is not demonstrated. No evidence of tricuspid stenosis. Aortic Valve: The aortic valve was not well visualized. Aortic valve regurgitation is not visualized. Pulmonic Valve: The pulmonic valve was not well visualized. Pulmonic valve regurgitation is not visualized. Aorta: The aortic root is normal in size and structure. Venous: The inferior vena cava is normal in size with greater than 50% respiratory variability, suggesting right atrial pressure of 3 mmHg. IAS/Shunts: No atrial level shunt  detected by color flow Doppler.  LEFT VENTRICLE PLAX 2D LVIDd:         5.10 cm      Diastology LVIDs:         4.30 cm      LV e' medial:    4.68 cm/s LV PW:         1.20 cm      LV E/e' medial:  12.7 LV IVS:        0.80 cm      LV e' lateral:   7.07 cm/s LVOT diam:     1.80 cm      LV E/e' lateral: 8.4 LV SV:         36 LV SV Index:   26 LVOT Area:     2.54 cm  LV Volumes (MOD) LV vol d, MOD A2C: 88.8 ml LV vol d, MOD A4C: 140.0 ml LV vol s, MOD A2C: 39.1 ml LV vol s, MOD A4C: 84.4 ml LV SV MOD A2C:     49.7 ml LV SV MOD A4C:     140.0 ml LV SV MOD BP:      58.0 ml RIGHT VENTRICLE RV S prime:     10.30 cm/s TAPSE (M-mode): 2.3 cm LEFT ATRIUM              Index        RIGHT ATRIUM           Index LA diam:        5.10 cm  3.65 cm/m   RA Area:     19.50 cm LA Vol (A2C):   118.0 ml 84.52 ml/m  RA Volume:   48.50 ml  34.74 ml/m LA Vol (A4C):   65.2 ml  46.70 ml/m LA Biplane Vol: 88.7 ml  63.54 ml/m  AORTIC VALVE LVOT Vmax:   62.10 cm/s LVOT Vmean:  40.300 cm/s LVOT VTI:    0.140 m  AORTA Ao Root diam: 3.40 cm Ao Asc diam:  2.30 cm MITRAL VALVE MV Area (PHT): 2.63 cm    SHUNTS MV Decel Time: 288 msec    Systemic VTI:  0.14 m MV E velocity: 59.30 cm/s  Systemic Diam: 1.80 cm MV A velocity: 49.50 cm/s MV E/A ratio:  1.20 Rudean Haskell MD Electronically signed by Rudean Haskell MD Signature Date/Time: 05/04/2022/12:11:00 PM    Final    DG Chest Portable 1 View  Result Date: 05/03/2022 CLINICAL DATA:  Shaking episode lasting less than 5 minutes EXAM: PORTABLE CHEST 1 VIEW COMPARISON:  Portable exam 1424 hours compared to 05/27/2021 FINDINGS: External pacing leads. Upper normal heart size. Mediastinal contours and pulmonary vascularity normal. RIGHT basilar atelectasis. No  pulmonary infiltrate, pleural effusion, or pneumothorax. Bones demineralized. IMPRESSION: RIGHT basilar atelectasis. Electronically Signed   By: Lavonia Dana M.D.   On: 05/03/2022 14:33   Disposition   Pt is being discharged home  today in good condition.  Follow-up Plans & Appointments     Follow-up Information     Finis Bud, NP Follow up on 05/17/2022.   Specialty: Nurse Practitioner Why: Appointment at 8:30 AM Contact information: [place] [address] [phone]                Discharge Instructions     Amb Referral to Cardiac Rehabilitation   Complete by: As directed    Diagnosis: Coronary Stents   After initial evaluation and assessments completed: Virtual Based Care may be provided alone or in conjunction with Phase 2 Cardiac Rehab based on patient barriers.: Yes   Intensive Cardiac Rehabilitation (ICR) [place] location only OR Traditional Cardiac Rehabilitation (TCR) *If criteria for ICR are not met will enroll in TCR [facility] only): Yes   Call MD for:  extreme fatigue   Complete by: As directed    Call MD for:  persistant nausea and vomiting   Complete by: As directed    Call MD for:  redness, tenderness, or signs of infection (pain, swelling, redness, odor or green/yellow discharge around incision site)   Complete by: As directed    Call MD for:  severe uncontrolled pain   Complete by: As directed    Diet - low sodium heart healthy   Complete by: As directed    Discharge instructions   Complete by: As directed    Radial Site Care Refer to this sheet in the next few weeks. These instructions provide you with information on caring for yourself after your procedure. Your caregiver may also give you more specific instructions. Your treatment has been planned according to current medical practices, but problems sometimes occur. Call your caregiver if you have any problems or questions after your procedure. HOME CARE INSTRUCTIONS You may shower the day after the procedure. Remove the bandage (dressing) and gently wash the site with plain soap and water. Gently pat the site dry.  Do not apply powder or lotion to the site.  Do not submerge the affected site in water for 3 to 5  days.  Inspect the site at least twice daily.  Do not flex or bend the affected arm for 24 hours.  No lifting over 5 pounds (2.3 kg) for 5 days after your procedure.  Do not drive home if you are discharged the same day of the procedure. Have someone else drive you.  You may drive 24 hours after the procedure unless otherwise instructed by your caregiver.  What to expect: Any bruising will usually fade within 1 to 2 weeks.  Blood that collects in the tissue (hematoma) may be painful to the touch. It should usually decrease in size and tenderness within 1 to 2 weeks.  SEEK IMMEDIATE MEDICAL CARE IF: You have unusual pain at the radial site.  You have redness, warmth, swelling, or pain at the radial site.  You have drainage (other than a small amount of blood on the dressing).  You have chills.  You have a fever or persistent symptoms for more than 72 hours.  You have a fever and your symptoms suddenly get worse.  Your arm becomes pale, cool, tingly, or numb.  You have heavy bleeding from the site. Hold pressure on the site.  Increase activity slowly   Complete by: As directed         Discharge Medications   Allergies as of 05/11/2022       Reactions   Lisinopril Other (See Comments)   Hyperkalemia / Renal failure        Medication List     STOP taking these medications    furosemide 20 MG tablet Commonly known as: LASIX   LORazepam 0.5 MG tablet Commonly known as: ATIVAN   megestrol 400 MG/10ML suspension Commonly known as: MEGACE   potassium chloride SA 20 MEQ tablet Commonly known as: KLOR-CON M   predniSONE 10 MG (21) Tbpk tablet Commonly known as: STERAPRED UNI-PAK 21 TAB   tiZANidine 2 MG tablet Commonly known as: ZANAFLEX       TAKE these medications    acetaminophen 325 MG tablet Commonly known as: TYLENOL Take 2 tablets (650 mg total) by mouth every 6 (six) hours as needed for mild pain (or Fever >/= 101). What changed: reasons to take this    allopurinol 100 MG tablet Commonly known as: ZYLOPRIM Take 1 tablet (100 mg total) by mouth daily. What changed: when to take this   apixaban 5 MG Tabs tablet Commonly known as: ELIQUIS Take 1 tablet (5 mg total) by mouth 2 (two) times daily.   Ascorbic Acid 500 MG Caps Take 500 mg by mouth 2 (two) times daily.   atorvastatin 80 MG tablet Commonly known as: LIPITOR Take 1 tablet (80 mg total) by mouth daily. Start taking on: May 12, 2022 What changed:  medication strength how much to take how to take this when to take this additional instructions   calcium carbonate 500 MG chewable tablet Commonly known as: TUMS - dosed in mg elemental calcium Chew 1,000 mg by mouth 3 (three) times daily before meals.   clopidogrel 75 MG tablet Commonly known as: PLAVIX Take 1 tablet (75 mg total) by mouth daily with breakfast. Start taking on: May 12, 2022   cyanocobalamin 500 MCG tablet Commonly known as: V-R VITAMIN B-12 Take 1 tablet (500 mcg total) by mouth daily. What changed: when to take this   folic acid 1 MG tablet Commonly known as: FOLVITE Take 1 mg by mouth in the morning.   Glucagon Emergency 1 MG Kit Inject 1 mg into the muscle as needed (hypoglycemia).   hydrALAZINE 50 MG tablet Commonly known as: APRESOLINE Take 1 tablet (50 mg total) by mouth every 8 (eight) hours. What changed:  medication strength how much to take when to take this   insulin glargine 100 UNIT/ML Solostar Pen Commonly known as: LANTUS Inject 16 Units into the skin at bedtime.   Admelog SoloStar 100 UNIT/ML KwikPen Generic drug: insulin lispro Inject 16 Units into the skin at bedtime. What changed: Another medication with the same name was changed. Make sure you understand how and when to take each.   insulin lispro 100 UNIT/ML KwikPen Commonly known as: HUMALOG Inject 3-9 Units into the skin 3 (three) times daily. Sliding scale 90-150-3 units, 151-200- 4units, 200-250  5units, 251-300 6units, 301-350 7units, 351-400 8units,401-450 9units What changed:  how much to take when to take this additional instructions   isosorbide mononitrate 60 MG 24 hr tablet Commonly known as: IMDUR Take 1 tablet (60 mg total) by mouth daily. Start taking on: May 12, 2022 What changed:  medication strength how much to take   levothyroxine 100 MCG tablet Commonly known as: SYNTHROID Take 100 mcg by mouth  daily before breakfast.   metoprolol succinate 100 MG 24 hr tablet Commonly known as: TOPROL-XL Take 1 tablet (100 mg total) by mouth daily. Take with or immediately following a meal. What changed:  when to take this additional instructions   mexiletine 150 MG capsule Commonly known as: MEXITIL Take 1 capsule (150 mg total) by mouth every 8 (eight) hours.   mirtazapine 7.5 MG tablet Commonly known as: REMERON Take 7.5 mg by mouth at bedtime.   multivitamin with minerals Tabs tablet Take 1 tablet by mouth daily. What changed: when to take this   Nutritional Drink Liqd Take 240 mLs by mouth daily at 12 noon. House Supplement   pantoprazole 40 MG tablet Commonly known as: PROTONIX Take 1 tablet (40 mg total) by mouth 2 (two) times daily. What changed: when to take this   polyethylene glycol 17 g packet Commonly known as: MIRALAX / GLYCOLAX Take 17 g by mouth daily. What changed: when to take this   sertraline 50 MG tablet Commonly known as: ZOLOFT Take 1 tablet (50 mg total) by mouth at bedtime. What changed: when to take this   sodium bicarbonate 650 MG tablet Take 650 mg by mouth 3 (three) times daily before meals.   thiamine 100 MG tablet Commonly known as: VITAMIN B1 Take 1 tablet (100 mg total) by mouth daily. What changed: when to take this   Vitamin D3 50 MCG (2000 UT) capsule Take 2,000 Units by mouth in the morning.           Outstanding Labs/Studies   BMP at follow up  Outpatient cardiac PET for sarcoidosis    Duration of Discharge Encounter   Greater than 30 minutes including physician time.  Signed, Margie Billet, PA-C 05/11/2022, 10:42 AM

## 2022-05-11 NOTE — TOC Transition Note (Signed)
Transition of Care [facility]) - CM/SW Discharge Note   Patient Details  Name: Todd [name] MRN: [id number] Date of Birth: 08/25/1955  [facility] CM/SW Contact:  Milas Gain, [place] Phone Number: 05/11/2022, 11:30 AM   Clinical Narrative:     Patient will DC to: [facility]  Anticipated DC date: 05/11/2022  Family notified: [name]   Transport by: [name]  ?  Per MD patient ready for DC to [facility] . RN, patient, patient's family, and facility notified of DC. Discharge Summary sent to facility. RN given number for report tele# [phone] RM# [id number] Bed 2. DC packet on chart. Ambulance transport requested for patient.  CSW signing off.     Final next level of care: Skilled Nursing Facility Barriers to Discharge: No Barriers Identified   Patient Goals and CMS Choice Patient states their goals for this hospitalization and ongoing recovery are:: SNF CMS Medicare.gov Compare Post Acute Care list provided to:: Patient Choice offered to / list presented to : Patient  Discharge Placement              Patient chooses bed at:  [facility]) Patient to be transferred to facility by: [place] Name of family member notified: [name] Patient and family notified of of transfer: 05/11/22  Discharge Plan and Services In-house Referral: Clinical Social Work                                   Social Determinants of Health (SDOH) Interventions     Readmission Risk Interventions     No data to display

## 2022-05-13 ENCOUNTER — Ambulatory Visit (INDEPENDENT_AMBULATORY_CARE_PROVIDER_SITE_OTHER): Payer: Medicare (Managed Care) | Admitting: Nurse Practitioner

## 2022-05-13 ENCOUNTER — Encounter: Payer: Self-pay | Admitting: Nurse Practitioner

## 2022-05-13 VITALS — BP 136/75 | HR 72

## 2022-05-13 DIAGNOSIS — E039 Hypothyroidism, unspecified: Secondary | ICD-10-CM | POA: Diagnosis not present

## 2022-05-13 DIAGNOSIS — E782 Mixed hyperlipidemia: Secondary | ICD-10-CM | POA: Diagnosis not present

## 2022-05-13 DIAGNOSIS — E1121 Type 2 diabetes mellitus with diabetic nephropathy: Secondary | ICD-10-CM

## 2022-05-13 DIAGNOSIS — Z794 Long term (current) use of insulin: Secondary | ICD-10-CM

## 2022-05-13 DIAGNOSIS — E559 Vitamin D deficiency, unspecified: Secondary | ICD-10-CM

## 2022-05-13 DIAGNOSIS — F17200 Nicotine dependence, unspecified, uncomplicated: Secondary | ICD-10-CM

## 2022-05-13 DIAGNOSIS — I10 Essential (primary) hypertension: Secondary | ICD-10-CM | POA: Diagnosis not present

## 2022-05-13 LAB — POCT GLYCOSYLATED HEMOGLOBIN (HGB A1C): Hemoglobin A1C: 6.2 % — AB (ref 4.0–5.6)

## 2022-05-13 NOTE — Progress Notes (Signed)
05/13/2022, 9:23 AM        Endocrinology follow-up note       Subjective:    Patient ID: Todd [name], male    DOB: [birth year].  Shaymus Eveleth is here to follow-up for management of currently uncontrolled symptomatic diabetes requested by  [name], DO.   Past Medical History:  Diagnosis Date   AKI (acute kidney injury) ([place])    Alcohol abuse    Cocaine abuse ([place]) 01/25/2021   Constipated    Diabetes mellitus without complication (HCC)    Diarrhea    Elevated LFTs    Goiter    Gout    Hyperlipidemia    Hypertension    Leukocytosis    Reactive thrombocytosis    Right BKA infection ([place]) 08/2016   Right leg pain    Sepsis due to undetermined organism [facility])    Thyroid disease    Wound infection after surgery 08/2016    Past Surgical History:  Procedure Laterality Date   ABDOMINAL AORTOGRAM N/A 08/11/2016   Procedure: Abdominal Aortogram;  Surgeon: Waynetta Sandy, MD;  Location: [place] [facility];  Service: Cardiovascular;  Laterality: N/A;   ABDOMINAL AORTOGRAM W/LOWER EXTREMITY N/A 08/15/2016   Procedure: Abdominal Aortogram w/Lower Extremity;  Surgeon: Elam Dutch, MD;  Location: [place] [facility];  Service: Cardiovascular;  Laterality: N/A;   AMPUTATION Right 08/17/2016   Procedure: RIGHT BELOW KNEE AMPUTATION;  Surgeon: Elam Dutch, MD;  Location: [place];  Service: Vascular;  Laterality: Right;   AMPUTATION Right 09/12/2016   Procedure: AMPUTATION ABOVE KNEE;  Surgeon: Newt Minion, MD;  Location: [place];  Service: Orthopedics;  Laterality: Right;   AMPUTATION Left 08/12/2016   Procedure: LEFT BELOW KNEE AMPUTATION;  Surgeon: Newt Minion, MD;  Location: [place];  Service: Orthopedics;  Laterality: Left;   AMPUTATION Left 11/01/2017   Procedure: LEFT ABOVE KNEE AMPUTATION;  Surgeon: Newt Minion, MD;  Location: [place];  Service: Orthopedics;  Laterality: Left;   APPLICATION  OF WOUND VAC Right 09/12/2016   Procedure: APPLICATION OF WOUND VAC ABOVE KNEE;  Surgeon: Newt Minion, MD;  Location: [place];  Service: Orthopedics;  Laterality: Right;   BIOPSY  01/13/2021   Procedure: BIOPSY;  Surgeon: Rogene Houston, MD;  [facility];  Service: Endoscopy;;  esophageal   COLONOSCOPY WITH PROPOFOL N/A 01/13/2021   Procedure: COLONOSCOPY WITH PROPOFOL;  Surgeon: Rogene Houston, MD;  [facility];  Service: Endoscopy;  Laterality: N/A;   CORONARY STENT INTERVENTION N/A 05/09/2022   Procedure: CORONARY STENT INTERVENTION;  Surgeon: Nelva Bush, MD;  Location: [place] [facility];  Service: Cardiovascular;  Laterality: N/A;   ESOPHAGOGASTRODUODENOSCOPY (EGD) WITH PROPOFOL N/A 01/13/2021   Procedure: ESOPHAGOGASTRODUODENOSCOPY (EGD) WITH PROPOFOL;  Surgeon: Rogene Houston, MD;  [facility];  Service: Endoscopy;  Laterality: N/A;   INTRAVASCULAR PRESSURE WIRE/FFR STUDY N/A 05/09/2022   Procedure: INTRAVASCULAR PRESSURE WIRE/FFR STUDY;  Surgeon: Nelva Bush, MD;  Location: [place] [facility];  Service: Cardiovascular;  Laterality: N/A;   LEFT HEART CATH AND CORONARY ANGIOGRAPHY N/A 05/09/2022   Procedure: LEFT HEART CATH AND CORONARY ANGIOGRAPHY;  Surgeon: Nelva Bush, MD;  Location: [place] [facility];  Service: Cardiovascular;  Laterality: N/A;   LOWER EXTREMITY ANGIOGRAPHY Bilateral 08/11/2016   Procedure: Lower Extremity Angiography;  Surgeon: Waynetta Sandy, MD;  Location: [place] [facility];  Service: Cardiovascular;  Laterality: Bilateral;   PERIPHERAL VASCULAR BALLOON ANGIOPLASTY Left 08/11/2016   Procedure: Peripheral Vascular Balloon Angioplasty;  Surgeon: Waynetta Sandy, MD;  Location: [place] [facility];  Service: Cardiovascular;  Laterality: Left;  SFA   POLYPECTOMY  01/13/2021   Procedure: POLYPECTOMY;  Surgeon: Rogene Houston, MD;  [facility];  Service: Endoscopy;;   THYROID SURGERY       Social History   Socioeconomic History   Marital status: Single    Spouse name: Not on file   Number of children: Not on file   Years of education: Not on file   Highest education level: Not on file  Occupational History   Occupation: retired    Comment: drove a Medical illustrator  Tobacco Use   Smoking status: Every Day    Packs/day: 0.25    Years: 45.00    Total pack years: 11.25    Types: Cigarettes    Last attempt to quit: 11/12/2014    Years since quitting: 7.5    Passive exposure: Never   Smokeless tobacco: Never  Vaping Use   Vaping Use: Never used  Substance and Sexual Activity   Alcohol use: Not Currently    Alcohol/week: 2.0 standard drinks of alcohol    Types: 1 Glasses of wine, 1 Cans of beer per week   Drug use: Not Currently    Types: Cocaine   Sexual activity: Not Currently  Other Topics Concern   Not on file  Social History Narrative   09/23/20 - Lives alone, uses a wheelchair, double amputee, not married, no children - HHA comes in 3x per week to help him bathe and set up weekly meds.   Social Determinants of Health   Financial Resource Strain: Low Risk  (09/23/2020)   Overall Financial Resource Strain (CARDIA)    Difficulty of Paying Living Expenses: Not very hard  Food Insecurity: No Food Insecurity (05/04/2022)   Hunger Vital Sign    Worried About Running Out of Food in the Last Year: Never true    Ran Out of Food in the Last Year: Never true  Transportation Needs: No Transportation Needs (05/04/2022)   PRAPARE - Hydrologist (Medical): No    Lack of Transportation (Non-Medical): No  Physical Activity: Inactive (09/23/2020)   Exercise Vital Sign    Days of Exercise per Week: 0 days    Minutes of Exercise per Session: 0 min  Stress: No Stress Concern Present (09/23/2020)   [place]    Feeling of Stress : Only a little  Social Connections: Socially Isolated  (09/23/2020)   Social Connection and Isolation Panel [NHANES]    Frequency of Communication with Friends and Family: More than three times a week    Frequency of Social Gatherings with Friends and Family: More than three times a week    Attends Religious Services: Never    Marine scientist or Organizations: No    Attends Music therapist: Never    Marital Status: Never married    Family History  Problem Relation Age of Onset   Heart disease Mother    Pneumonia Father    Diabetes Maternal Aunt    Diabetes Maternal Uncle     Outpatient  Encounter Medications as of 05/13/2022  Medication Sig   acetaminophen (TYLENOL) 325 MG tablet Take 2 tablets (650 mg total) by mouth every 6 (six) hours as needed for mild pain (or Fever >/= 101). (Patient taking differently: Take 650 mg by mouth every 6 (six) hours as needed for moderate pain.)   allopurinol (ZYLOPRIM) 100 MG tablet Take 1 tablet (100 mg total) by mouth daily. (Patient taking differently: Take 100 mg by mouth in the morning.)   apixaban (ELIQUIS) 5 MG TABS tablet Take 1 tablet (5 mg total) by mouth 2 (two) times daily.   Ascorbic Acid 500 MG CAPS Take 500 mg by mouth 2 (two) times daily.   atorvastatin (LIPITOR) 80 MG tablet Take 1 tablet (80 mg total) by mouth daily.   calcium carbonate (TUMS - DOSED IN MG ELEMENTAL CALCIUM) 500 MG chewable tablet Chew 1,000 mg by mouth 3 (three) times daily before meals.   Cholecalciferol (VITAMIN D3) 50 MCG (2000 UT) capsule Take 2,000 Units by mouth in the morning.   clopidogrel (PLAVIX) 75 MG tablet Take 1 tablet (75 mg total) by mouth daily with breakfast.   cyanocobalamin (V-R VITAMIN B-12) 500 MCG tablet Take 1 tablet (500 mcg total) by mouth daily. (Patient taking differently: Take 500 mcg by mouth in the morning.)   folic acid (FOLVITE) 1 MG tablet Take 1 mg by mouth in the morning.   Glucagon, rDNA, (GLUCAGON EMERGENCY) 1 MG KIT Inject 1 mg into the muscle as needed  (hypoglycemia).   hydrALAZINE (APRESOLINE) 50 MG tablet Take 1 tablet (50 mg total) by mouth every 8 (eight) hours.   insulin lispro (ADMELOG SOLOSTAR) 100 UNIT/ML KwikPen Inject 16 Units into the skin at bedtime.   insulin lispro (HUMALOG) 100 UNIT/ML KwikPen Inject 3-9 Units into the skin 3 (three) times daily. Sliding scale 90-150-3 units, 151-200- 4units, 200-250 5units, 251-300 6units, 301-350 7units, 351-400 8units,401-450 9units (Patient taking differently: Inject 0-6 Units into the skin See admin instructions. Inject 0-10 units 3 times daily before meals and at bedtime per sliding scale: CBG < 150 : 0 units CBG 151-200 : 1 units CBG 201-250 : 2 units CBG 251-300 : 3 units CBG 301-350 : 4 units CBG 351-400 : 5 units (over 400 give 10 units and call provider) CBG 401-500 : 6 units)   isosorbide mononitrate (IMDUR) 60 MG 24 hr tablet Take 1 tablet (60 mg total) by mouth daily.   levothyroxine (SYNTHROID) 100 MCG tablet Take 100 mcg by mouth daily before breakfast.   metoprolol succinate (TOPROL-XL) 100 MG 24 hr tablet Take 1 tablet (100 mg total) by mouth daily. Take with or immediately following a meal. (Patient taking differently: Take 100 mg by mouth in the morning.)   mexiletine (MEXITIL) 150 MG capsule Take 1 capsule (150 mg total) by mouth every 8 (eight) hours.   mirtazapine (REMERON) 7.5 MG tablet Take 7.5 mg by mouth at bedtime.   Multiple Vitamin (MULTIVITAMIN WITH MINERALS) TABS tablet Take 1 tablet by mouth daily. (Patient taking differently: Take 1 tablet by mouth in the morning.)   Nutritional Supplements (NUTRITIONAL DRINK) LIQD Take 240 mLs by mouth daily at 12 noon. House Supplement   pantoprazole (PROTONIX) 40 MG tablet Take 1 tablet (40 mg total) by mouth 2 (two) times daily. (Patient taking differently: Take 40 mg by mouth in the morning.)   polyethylene glycol (MIRALAX / GLYCOLAX) 17 g packet Take 17 g by mouth daily. (Patient taking differently: Take 17 g by mouth in  the  morning.)   sertraline (ZOLOFT) 50 MG tablet Take 1 tablet (50 mg total) by mouth at bedtime. (Patient taking differently: Take 50 mg by mouth in the morning.)   sodium bicarbonate 650 MG tablet Take 650 mg by mouth 3 (three) times daily before meals.   thiamine 100 MG tablet Take 1 tablet (100 mg total) by mouth daily. (Patient taking differently: Take 100 mg by mouth in the morning.)   [DISCONTINUED] insulin glargine (LANTUS) 100 UNIT/ML Solostar Pen Inject 16 Units into the skin at bedtime.   No facility-administered encounter medications on file as of 05/13/2022.    ALLERGIES: Allergies  Allergen Reactions   Lisinopril Other (See Comments)    Hyperkalemia / Renal failure    VACCINATION STATUS: Immunization History  Administered Date(s) Administered   Influenza,inj,Quad PF,6+ Mos 05/20/2015, 04/07/2017, 03/09/2018, 05/01/2019   Influenza-Unspecified 05/10/2011, 04/18/2022   PFIZER Comirnaty(Gray Top)Covid-19 Tri-Sucrose Vaccine 07/28/2020, 08/18/2020, 08/18/2020   PPD Test 09/16/2016    Diabetes He presents for his follow-up diabetic visit. He has type 2 diabetes mellitus. Onset time: Patient was diagnosed with A1c of greater than 14% in 2017 at the age of 51 years. His disease course has been stable. Pertinent negatives for hypoglycemia include no confusion, headaches, nervousness/anxiousness, pallor, seizures or tremors. Pertinent negatives for diabetes include no chest pain, no fatigue, no polydipsia, no polyphagia, no polyuria and no weakness. There are no hypoglycemic complications. Symptoms are stable. Diabetic complications include nephropathy and PVD. Risk factors for coronary artery disease include dyslipidemia, diabetes mellitus, male sex, sedentary lifestyle, tobacco exposure and hypertension. Current diabetic treatment includes intensive insulin program. He is compliant with treatment most of the time (now in SNF). His weight is fluctuating minimally. He is following a  generally healthy diet. When asked about meal planning, he reported none. He has not had a previous visit with a dietitian. He never participates in exercise. His home blood glucose trend is fluctuating minimally. (He presents today, accompanied by care attendant from the nursing facility, with his logs showing at goal glycemic profile.  His POCT A1c today is 6.2%, increasing from last visit of 5.6%.  He does have anemia which can make his A1c result unreliable as a means of determining control of diabetes.  He was recently hospitalized for heart arrhythmia.  His MAR from the facility shows 2 different long acting insulins as active but when confirmed with his nurse, he has only been getting the Admelog.) An ACE inhibitor/angiotensin II receptor blocker is not being taken. He does not see a podiatrist.Eye exam is current.  Hyperlipidemia This is a chronic problem. The current episode started more than 1 year ago. The problem is controlled. Recent lipid tests were reviewed and are normal. Exacerbating diseases include chronic renal disease, diabetes and hypothyroidism. Factors aggravating his hyperlipidemia include beta blockers. Pertinent negatives include no chest pain, myalgias or shortness of breath. Current antihyperlipidemic treatment includes statins. The current treatment provides mild improvement of lipids. Compliance problems include adherence to diet and adherence to exercise.  Risk factors for coronary artery disease include diabetes mellitus, dyslipidemia, a sedentary lifestyle, male sex and hypertension.  Thyroid Problem Presents for follow-up visit. Onset time: He reports thyroidectomy in 2013 due to benign nodular goiter. Patient reports no anxiety, cold intolerance, constipation, depressed mood, diarrhea, fatigue, heat intolerance, palpitations or tremors. The symptoms have been stable. The treatment provided moderate relief. Prior procedures include thyroidectomy. His past medical history is  significant for diabetes and hyperlipidemia.  Review of systems  Constitutional: + Minimally fluctuating body weight,  current There is no height or weight on file to calculate BMI. , no fatigue, no subjective hyperthermia, no subjective hypothermia Eyes: no blurry vision, no xerophthalmia ENT: no sore throat, no nodules palpated in throat, no dysphagia/odynophagia, no hoarseness Cardiovascular: no chest pain, no shortness of breath, no palpitations, no leg swelling Respiratory: no cough, no shortness of breath Gastrointestinal: no nausea/vomiting/diarrhea Musculoskeletal: no muscle/joint aches, hx of bilateral AKA- WC bound Skin: no rashes, no hyperemia Neurological: no tremors, no numbness, no tingling, no dizziness Psychiatric: no depression, no anxiety  Objective:    BP 136/75 (BP Location: Right Arm, Patient Position: Sitting, Cuff Size: Large)   Pulse 72   Wt Readings from Last 3 Encounters:  05/09/22 149 lb 0.5 oz (67.6 kg)  03/29/22 97 lb (44 kg)  10/06/21 96 lb (43.5 kg)    BP Readings from Last 3 Encounters:  05/13/22 136/75  05/11/22 (!) 173/74  03/30/22 (!) 142/79    Review of systems  Constitutional: + Minimally fluctuating body weight,  current There is no height or weight on file to calculate BMI. , no fatigue, no subjective hyperthermia, no subjective hypothermia Eyes: no blurry vision, no xerophthalmia Gastrointestinal: no nausea/vomiting/diarrhea Musculoskeletal: no muscle/joint aches, hx of bilateral AKA- WC bound Skin: no rashes, no hyperemia, nicotinic discoloration to fingernails bilaterally Neurological: no tremors, no numbness, no tingling, no dizziness Psychiatric: no depression, no anxiety    Diabetic Labs (most recent): Lab Results  Component Value Date   HGBA1C 6.2 (A) 05/13/2022   HGBA1C 5.6 01/11/2022   HGBA1C 8.7 (A) 09/08/2021     Lipid Panel ( most recent) Lipid Panel     Component Value Date/Time   CHOL 47 05/06/2022 0416    CHOL 59 (L) 09/22/2020 0923   TRIG 65 05/06/2022 0416   HDL 22 (L) 05/06/2022 0416   HDL 25 (L) 09/22/2020 0923   CHOLHDL 2.1 05/06/2022 0416   VLDL 13 05/06/2022 0416   LDLCALC 12 05/06/2022 0416   LDLCALC 19 09/22/2020 0923     Recent Results (from the past 2160 hour(s))  CBC with Differential     Status: Abnormal   Collection Time: 03/29/22  5:19 PM  Result Value Ref Range   WBC 11.3 (H) 4.0 - 10.5 K/uL   RBC 4.42 4.22 - 5.81 MIL/uL   Hemoglobin 12.1 (L) 13.0 - 17.0 g/dL   HCT 36.7 (L) 39.0 - 52.0 %   MCV 83.0 80.0 - 100.0 fL   MCH 27.4 26.0 - 34.0 pg   MCHC 33.0 30.0 - 36.0 g/dL   RDW 17.8 (H) 11.5 - 15.5 %   Platelets 527 (H) 150 - 400 K/uL   nRBC 0.0 0.0 - 0.2 %   Neutrophils Relative % 65 %   Neutro Abs 7.5 1.7 - 7.7 K/uL   Lymphocytes Relative 22 %   Lymphs Abs 2.5 0.7 - 4.0 K/uL   Monocytes Relative 5 %   Monocytes Absolute 0.5 0.1 - 1.0 K/uL   Eosinophils Relative 6 %   Eosinophils Absolute 0.7 (H) 0.0 - 0.5 K/uL   Basophils Relative 1 %   Basophils Absolute 0.1 0.0 - 0.1 K/uL   Immature Granulocytes 1 %   Abs Immature Granulocytes 0.06 0.00 - 0.07 K/uL    Comment: Performed at [facility], [address] [place], [place] [address]  Sedimentation rate     Status: Abnormal   Collection Time: 03/29/22  5:19 PM  Result Value Ref Range   Sed Rate 61 (H) 0 - 16 mm/hr    Comment: Performed at [facility], [address], [place], [place] [address]  C-reactive protein     Status: None   Collection Time: 03/29/22  5:19 PM  Result Value Ref Range   CRP 0.7 <1.0 mg/dL    Comment: Performed at [facility], [place] [address], [place], [place] [address]  Comprehensive metabolic panel     Status: Abnormal   Collection Time: 03/29/22  5:19 PM  Result Value Ref Range   Sodium 145 135 - 145 mmol/L   Potassium 3.5 3.5 - 5.1 mmol/L   Chloride 113 (H) 98 - 111 mmol/L   CO2 23 22 - 32 mmol/L   Glucose, Bld 80 70 - 99 mg/dL    Comment: Glucose reference range applies  only to samples taken after fasting for at least 8 hours.   BUN 21 8 - 23 mg/dL   Creatinine, Ser 1.93 (H) 0.61 - 1.24 mg/dL   Calcium 6.9 (L) 8.9 - 10.3 mg/dL   Total Protein 7.8 6.5 - 8.1 g/dL   Albumin 2.7 (L) 3.5 - 5.0 g/dL   AST 27 15 - 41 U/L   ALT 15 0 - 44 U/L   Alkaline Phosphatase 172 (H) 38 - 126 U/L   Total Bilirubin 0.9 0.3 - 1.2 mg/dL   GFR, Estimated 38 (L) >60 mL/min    Comment: (NOTE) Calculated using the CKD-EPI Creatinine Equation (2021)    Anion gap 9 5 - 15    Comment: Performed at [facility], [address] [place], [place] [address]  Magnesium     Status: Abnormal   Collection Time: 03/29/22  5:19 PM  Result Value Ref Range   Magnesium 1.6 (L) 1.7 - 2.4 mg/dL    Comment: Performed at [facility], [address], [place], [place] [address]  Urinalysis, Routine w reflex microscopic Urine, Clean Catch     Status: Abnormal   Collection Time: 03/29/22  5:44 PM  Result Value Ref Range   Color, Urine YELLOW YELLOW   APPearance CLEAR CLEAR   Specific Gravity, Urine 1.019 1.005 - 1.030   pH 6.0 5.0 - 8.0   Glucose, UA NEGATIVE NEGATIVE mg/dL   Hgb urine dipstick NEGATIVE NEGATIVE   Bilirubin Urine NEGATIVE NEGATIVE   Ketones, ur NEGATIVE NEGATIVE mg/dL   Protein, ur >=300 (A) NEGATIVE mg/dL   Nitrite NEGATIVE NEGATIVE   Leukocytes,Ua TRACE (A) NEGATIVE   RBC / HPF 0-5 0 - 5 RBC/hpf   WBC, UA 11-20 0 - 5 WBC/hpf   Bacteria, UA NONE SEEN NONE SEEN   Squamous Epithelial / LPF 0-5 0 - 5    Comment: Performed at [facility], [address], [place], [place] [address]  Basic metabolic panel     Status: Abnormal   Collection Time: 05/03/22  2:19 PM  Result Value Ref Range   Sodium 143 135 - 145 mmol/L   Potassium 3.5 3.5 - 5.1 mmol/L   Chloride 110 98 - 111 mmol/L   CO2 21 (L) 22 - 32 mmol/L   Glucose, Bld 99 70 - 99 mg/dL    Comment: Glucose reference range applies only to samples taken after fasting for at least 8 hours.   BUN 24 (H) 8 - 23 mg/dL    Creatinine, Ser 2.22 (H) 0.61 - 1.24 mg/dL   Calcium 7.0 (L) 8.9 - 10.3 mg/dL   GFR, Estimated 32 (L) >  60 mL/min    Comment: (NOTE) Calculated using the CKD-EPI Creatinine Equation (2021)    Anion gap 12 5 - 15    Comment: Performed at [facility], [address], [place], [place] [address]  Magnesium     Status: Abnormal   Collection Time: 05/03/22  2:19 PM  Result Value Ref Range   Magnesium 1.3 (L) 1.7 - 2.4 mg/dL    Comment: Performed at [facility], [address] [place], [place] [address]  CBC with Differential/Platelet     Status: Abnormal   Collection Time: 05/03/22  2:19 PM  Result Value Ref Range   WBC 9.5 4.0 - 10.5 K/uL   RBC 4.71 4.22 - 5.81 MIL/uL   Hemoglobin 12.9 (L) 13.0 - 17.0 g/dL   HCT 39.5 39.0 - 52.0 %   MCV 83.9 80.0 - 100.0 fL   MCH 27.4 26.0 - 34.0 pg   MCHC 32.7 30.0 - 36.0 g/dL   RDW 16.0 (H) 11.5 - 15.5 %   Platelets 600 (H) 150 - 400 K/uL   nRBC 0.0 0.0 - 0.2 %   Neutrophils Relative % 65 %   Neutro Abs 6.2 1.7 - 7.7 K/uL   Lymphocytes Relative 25 %   Lymphs Abs 2.4 0.7 - 4.0 K/uL   Monocytes Relative 5 %   Monocytes Absolute 0.5 0.1 - 1.0 K/uL   Eosinophils Relative 4 %   Eosinophils Absolute 0.3 0.0 - 0.5 K/uL   Basophils Relative 1 %   Basophils Absolute 0.1 0.0 - 0.1 K/uL   Immature Granulocytes 0 %   Abs Immature Granulocytes 0.03 0.00 - 0.07 K/uL    Comment: Performed at [facility], [address] [place], [place] [address]  TSH     Status: None   Collection Time: 05/03/22  2:19 PM  Result Value Ref Range   TSH 4.318 0.350 - 4.500 uIU/mL    Comment: Performed by a 3rd Generation assay with a functional sensitivity of <=0.01 uIU/mL. Performed at [facility], [address] [place], [place] [address]   Troponin I (High Sensitivity)     Status: Abnormal   Collection Time: 05/03/22  2:19 PM  Result Value Ref Range   Troponin I (High Sensitivity) 30 (H) <18 ng/L    Comment: (NOTE) Elevated high sensitivity troponin I (hsTnI) values  and significant  changes across serial measurements may suggest ACS but many other  chronic and acute conditions are known to elevate hsTnI results.  Refer to the "Links" section for chest pain algorithms and additional  guidance. Performed at [facility], [address], [place], [place] [address]   Ginger Carne 8, ED     Status: Abnormal   Collection Time: 05/03/22  2:21 PM  Result Value Ref Range   Sodium 146 (H) 135 - 145 mmol/L   Potassium 3.7 3.5 - 5.1 mmol/L   Chloride 109 98 - 111 mmol/L   BUN 21 8 - 23 mg/dL   Creatinine, Ser 2.00 (H) 0.61 - 1.24 mg/dL   Glucose, Bld 98 70 - 99 mg/dL    Comment: Glucose reference range applies only to samples taken after fasting for at least 8 hours.   Calcium, Ion 0.88 (LL) 1.15 - 1.40 mmol/L   TCO2 20 (L) 22 - 32 mmol/L   Hemoglobin 14.6 13.0 - 17.0 g/dL   HCT 43.0 39.0 - 52.0 %   Comment NOTIFIED PHYSICIAN   CBG monitoring, ED     Status: None   Collection  Time: 05/03/22  2:28 PM  Result Value Ref Range   Glucose-Capillary 95 70 - 99 mg/dL    Comment: Glucose reference range applies only to samples taken after fasting for at least 8 hours.  Troponin I (High Sensitivity)     Status: Abnormal   Collection Time: 05/03/22  5:55 PM  Result Value Ref Range   Troponin I (High Sensitivity) 73 (H) <18 ng/L    Comment: DELTA CHECK NOTED RESULT CALLED TO, READ BACK BY AND VERIFIED WITH: T. SOUTHARD AT 1902 ON 11.21.23 BY ADGER J (NOTE) Elevated high sensitivity troponin I (hsTnI) values and significant  changes across serial measurements may suggest ACS but many other  chronic and acute conditions are known to elevate hsTnI results.  Refer to the Links section for chest pain algorithms and additional  guidance. Performed at [facility], [address] [place], [place] [address]   MRSA Next Gen by PCR, Nasal     Status: Abnormal   Collection Time: 05/03/22 10:36 PM   Specimen: Nasal Mucosa; Nasal Swab  Result Value Ref Range   MRSA by  PCR Next Gen DETECTED (A) NOT DETECTED    Comment: RESULT CALLED TO, READ BACK BY AND VERIFIED WITH: CAMMON RN 05/04/22 _0  BY AB (NOTE) The GeneXpert MRSA Assay (FDA approved for NASAL specimens only), is one component of a comprehensive MRSA colonization surveillance program. It is not intended to diagnose MRSA infection nor to guide or monitor treatment for MRSA infections. Test performance is not FDA approved in patients less than 70 years old. Performed at [facility], [place] [address] [place], [place] [address]   Glucose, capillary     Status: None   Collection Time: 05/03/22 10:52 PM  Result Value Ref Range   Glucose-Capillary 89 70 - 99 mg/dL    Comment: Glucose reference range applies only to samples taken after fasting for at least 8 hours.  Rapid urine drug screen (hospital performed)     Status: Abnormal   Collection Time: 05/04/22  1:16 AM  Result Value Ref Range   Opiates POSITIVE (A) NONE DETECTED   Cocaine NONE DETECTED NONE DETECTED   Benzodiazepines NONE DETECTED NONE DETECTED   Amphetamines NONE DETECTED NONE DETECTED   Tetrahydrocannabinol NONE DETECTED NONE DETECTED   Barbiturates NONE DETECTED NONE DETECTED    Comment: (NOTE) DRUG SCREEN FOR MEDICAL PURPOSES ONLY.  IF CONFIRMATION IS NEEDED FOR ANY PURPOSE, NOTIFY LAB WITHIN 5 DAYS.  LOWEST DETECTABLE LIMITS FOR URINE DRUG SCREEN Drug Class                     Cutoff (ng/mL) Amphetamine and metabolites    1000 Barbiturate and metabolites    200 Benzodiazepine                 200 Opiates and metabolites        300 Cocaine and metabolites        300 THC                            50 Performed at [facility], [place] [address] [place],  [address]   HIV Antibody (routine testing w rflx)     Status: None   Collection Time: 05/04/22  3:05 AM  Result Value Ref Range   HIV Screen 4th Generation wRfx Non Reactive Non Reactive    Comment: Performed at [facility], [place]  [address] [place], [place] [address]  Basic metabolic panel     Status: Abnormal   Collection Time: 05/04/22  3:05 AM  Result Value Ref Range   Sodium 141 135 - 145 mmol/L   Potassium 4.0 3.5 - 5.1 mmol/L   Chloride 113 (H) 98 - 111 mmol/L   CO2 20 (L) 22 - 32 mmol/L   Glucose, Bld 109 (H) 70 - 99 mg/dL    Comment: Glucose reference range applies only to samples taken after fasting for at least 8 hours.   BUN 21 8 - 23 mg/dL   Creatinine, Ser 1.47 (H) 0.61 - 1.24 mg/dL   Calcium 7.1 (L) 8.9 - 10.3 mg/dL   GFR, Estimated 52 (L) >60 mL/min    Comment: (NOTE) Calculated using the CKD-EPI Creatinine Equation (2021)    Anion gap 8 5 - 15    Comment: Performed at [facility] [address], [place], [place] [address]  CBC     Status: Abnormal   Collection Time: 05/04/22  3:05 AM  Result Value Ref Range   WBC 9.7 4.0 - 10.5 K/uL   RBC 4.23 4.22 - 5.81 MIL/uL   Hemoglobin 11.5 (L) 13.0 - 17.0 g/dL   HCT 35.5 (L) 39.0 - 52.0 %   MCV 83.9 80.0 - 100.0 fL   MCH 27.2 26.0 - 34.0 pg   MCHC 32.4 30.0 - 36.0 g/dL   RDW 15.6 (H) 11.5 - 15.5 %   Platelets 548 (H) 150 - 400 K/uL   nRBC 0.0 0.0 - 0.2 %    Comment: Performed at [facility], [place] [address], [place], [place] [address]  Magnesium     Status: None   Collection Time: 05/04/22  3:05 AM  Result Value Ref Range   Magnesium 1.9 1.7 - 2.4 mg/dL    Comment: Performed at [facility] [address], [place], [place] [address]  Glucose, capillary     Status: Abnormal   Collection Time: 05/04/22  8:22 AM  Result Value Ref Range   Glucose-Capillary 123 (H) 70 - 99 mg/dL    Comment: Glucose reference range applies only to samples taken after fasting for at least 8 hours.   Comment 1 Notify RN   ECHOCARDIOGRAM COMPLETE     Status: None   Collection Time: 05/04/22 11:01 AM  Result Value Ref Range   Weight 1,615.53 oz   Height 60 in   BP 125/66 mmHg   Single Plane A2C EF 56.0 %   Single Plane A4C EF 39.7 %   Calc EF 49.5 %    S' Lateral 4.30 cm   Area-P 1/2 2.63 cm2  Glucose, capillary     Status: None   Collection Time: 05/04/22 11:11 AM  Result Value Ref Range   Glucose-Capillary 88 70 - 99 mg/dL    Comment: Glucose reference range applies only to samples taken after fasting for at least 8 hours.  Glucose, capillary     Status: Abnormal   Collection Time: 05/04/22  4:38 PM  Result Value Ref Range   Glucose-Capillary 130 (H) 70 - 99 mg/dL    Comment: Glucose reference range applies only to samples taken after fasting for at least 8 hours.  Glucose, capillary     Status: Abnormal   Collection Time: 05/04/22  8:11 PM  Result Value Ref Range   Glucose-Capillary 206 (H) 70 - 99 mg/dL    Comment: Glucose reference range applies only to  samples taken after fasting for at least 8 hours.  Glucose, capillary     Status: Abnormal   Collection Time: 05/04/22 11:31 PM  Result Value Ref Range   Glucose-Capillary 131 (H) 70 - 99 mg/dL    Comment: Glucose reference range applies only to samples taken after fasting for at least 8 hours.  CBC with Differential/Platelet     Status: Abnormal   Collection Time: 05/05/22  3:24 AM  Result Value Ref Range   WBC 12.2 (H) 4.0 - 10.5 K/uL   RBC 4.21 (L) 4.22 - 5.81 MIL/uL   Hemoglobin 11.8 (L) 13.0 - 17.0 g/dL   HCT 35.4 (L) 39.0 - 52.0 %   MCV 84.1 80.0 - 100.0 fL   MCH 28.0 26.0 - 34.0 pg   MCHC 33.3 30.0 - 36.0 g/dL   RDW 15.9 (H) 11.5 - 15.5 %   Platelets 509 (H) 150 - 400 K/uL   nRBC 0.0 0.0 - 0.2 %   Neutrophils Relative % 79 %   Neutro Abs 9.6 (H) 1.7 - 7.7 K/uL   Lymphocytes Relative 13 %   Lymphs Abs 1.5 0.7 - 4.0 K/uL   Monocytes Relative 6 %   Monocytes Absolute 0.7 0.1 - 1.0 K/uL   Eosinophils Relative 2 %   Eosinophils Absolute 0.3 0.0 - 0.5 K/uL   Basophils Relative 0 %   Basophils Absolute 0.0 0.0 - 0.1 K/uL   Immature Granulocytes 0 %   Abs Immature Granulocytes 0.05 0.00 - 0.07 K/uL    Comment: Performed at [facility], [address],  [place], [place] [address]  Comprehensive metabolic panel     Status: Abnormal   Collection Time: 05/05/22  3:24 AM  Result Value Ref Range   Sodium 140 135 - 145 mmol/L   Potassium 4.1 3.5 - 5.1 mmol/L   Chloride 104 98 - 111 mmol/L   CO2 21 (L) 22 - 32 mmol/L   Glucose, Bld 91 70 - 99 mg/dL    Comment: Glucose reference range applies only to samples taken after fasting for at least 8 hours.   BUN 21 8 - 23 mg/dL   Creatinine, Ser 2.24 (H) 0.61 - 1.24 mg/dL   Calcium 7.3 (L) 8.9 - 10.3 mg/dL   Total Protein 6.7 6.5 - 8.1 g/dL   Albumin 2.4 (L) 3.5 - 5.0 g/dL   AST 21 15 - 41 U/L   ALT 11 0 - 44 U/L   Alkaline Phosphatase 168 (H) 38 - 126 U/L   Total Bilirubin 0.7 0.3 - 1.2 mg/dL   GFR, Estimated 31 (L) >60 mL/min    Comment: (NOTE) Calculated using the CKD-EPI Creatinine Equation (2021)    Anion gap 15 5 - 15    Comment: Performed at [facility], [place] [address], [place], [place] [address]  Glucose, capillary     Status: Abnormal   Collection Time: 05/05/22  6:53 AM  Result Value Ref Range   Glucose-Capillary 117 (H) 70 - 99 mg/dL    Comment: Glucose reference range applies only to samples taken after fasting for at least 8 hours.  Glucose, capillary     Status: Abnormal   Collection Time: 05/05/22 12:17 PM  Result Value Ref Range   Glucose-Capillary 123 (H) 70 - 99 mg/dL    Comment: Glucose reference range applies only to samples taken after fasting for at least 8 hours.  Glucose, capillary     Status: Abnormal   Collection Time:  05/05/22  4:21 PM  Result Value Ref Range   Glucose-Capillary 188 (H) 70 - 99 mg/dL    Comment: Glucose reference range applies only to samples taken after fasting for at least 8 hours.  Glucose, capillary     Status: Abnormal   Collection Time: 05/05/22 10:53 PM  Result Value Ref Range   Glucose-Capillary 219 (H) 70 - 99 mg/dL    Comment: Glucose reference range applies only to samples taken after fasting for at least 8 hours.  Magnesium      Status: None   Collection Time: 05/06/22  4:16 AM  Result Value Ref Range   Magnesium 1.9 1.7 - 2.4 mg/dL    Comment: Performed at [facility] [address], [place], [place] [address]  Basic metabolic panel     Status: Abnormal   Collection Time: 05/06/22  4:16 AM  Result Value Ref Range   Sodium 140 135 - 145 mmol/L   Potassium 4.2 3.5 - 5.1 mmol/L   Chloride 106 98 - 111 mmol/L   CO2 21 (L) 22 - 32 mmol/L   Glucose, Bld 109 (H) 70 - 99 mg/dL    Comment: Glucose reference range applies only to samples taken after fasting for at least 8 hours.   BUN 26 (H) 8 - 23 mg/dL   Creatinine, Ser 2.65 (H) 0.61 - 1.24 mg/dL   Calcium 7.0 (L) 8.9 - 10.3 mg/dL   GFR, Estimated 26 (L) >60 mL/min    Comment: (NOTE) Calculated using the CKD-EPI Creatinine Equation (2021)    Anion gap 13 5 - 15    Comment: Performed at [facility] [address] [place], [place] [address]  Lipid panel     Status: Abnormal   Collection Time: 05/06/22  4:16 AM  Result Value Ref Range   Cholesterol 47 0 - 200 mg/dL   Triglycerides 65 <150 mg/dL   HDL 22 (L) >40 mg/dL   Total CHOL/HDL Ratio 2.1 RATIO   VLDL 13 0 - 40 mg/dL   LDL Cholesterol 12 0 - 99 mg/dL    Comment:        Total Cholesterol/HDL:CHD Risk Coronary Heart Disease Risk Table                     Men   Women  1/2 Average Risk   3.4   3.3  Average Risk       5.0   4.4  2 X Average Risk   9.6   7.1  3 X Average Risk  23.4   11.0        Use the calculated Patient Ratio above and the CHD Risk Table to determine the patient's CHD Risk.        ATP III CLASSIFICATION (LDL):  <100     mg/dL   Optimal  100-129  mg/dL   Near or Above                    Optimal  130-159  mg/dL   Borderline  160-189  mg/dL   High  >190     mg/dL   Very High Performed at [facility] [address], [place], [place] [address]   Glucose, capillary     Status: Abnormal   Collection Time: 05/06/22  8:04 AM  Result Value Ref Range    Glucose-Capillary 115 (H) 70 - 99 mg/dL    Comment: Glucose reference range applies only  to samples taken after fasting for at least 8 hours.  Glucose, capillary     Status: Abnormal   Collection Time: 05/06/22 11:37 AM  Result Value Ref Range   Glucose-Capillary 182 (H) 70 - 99 mg/dL    Comment: Glucose reference range applies only to samples taken after fasting for at least 8 hours.  Glucose, capillary     Status: Abnormal   Collection Time: 05/06/22  3:46 PM  Result Value Ref Range   Glucose-Capillary 193 (H) 70 - 99 mg/dL    Comment: Glucose reference range applies only to samples taken after fasting for at least 8 hours.  Glucose, capillary     Status: Abnormal   Collection Time: 05/06/22  9:11 PM  Result Value Ref Range   Glucose-Capillary 226 (H) 70 - 99 mg/dL    Comment: Glucose reference range applies only to samples taken after fasting for at least 8 hours.  Basic metabolic panel     Status: Abnormal   Collection Time: 05/07/22  1:01 AM  Result Value Ref Range   Sodium 138 135 - 145 mmol/L   Potassium 4.6 3.5 - 5.1 mmol/L   Chloride 108 98 - 111 mmol/L   CO2 20 (L) 22 - 32 mmol/L   Glucose, Bld 192 (H) 70 - 99 mg/dL    Comment: Glucose reference range applies only to samples taken after fasting for at least 8 hours.   BUN 30 (H) 8 - 23 mg/dL   Creatinine, Ser 2.43 (H) 0.61 - 1.24 mg/dL   Calcium 6.9 (L) 8.9 - 10.3 mg/dL   GFR, Estimated 28 (L) >60 mL/min    Comment: (NOTE) Calculated using the CKD-EPI Creatinine Equation (2021)    Anion gap 10 5 - 15    Comment: Performed at [facility] [address], [place], [place] [address]  Glucose, capillary     Status: Abnormal   Collection Time: 05/07/22  6:01 AM  Result Value Ref Range   Glucose-Capillary 180 (H) 70 - 99 mg/dL    Comment: Glucose reference range applies only to samples taken after fasting for at least 8 hours.   Comment 1 Notify RN    Comment 2 Document in Chart   Glucose, capillary     Status:  Abnormal   Collection Time: 05/07/22 11:50 AM  Result Value Ref Range   Glucose-Capillary 148 (H) 70 - 99 mg/dL    Comment: Glucose reference range applies only to samples taken after fasting for at least 8 hours.  Glucose, capillary     Status: Abnormal   Collection Time: 05/07/22  4:28 PM  Result Value Ref Range   Glucose-Capillary 285 (H) 70 - 99 mg/dL    Comment: Glucose reference range applies only to samples taken after fasting for at least 8 hours.  Glucose, capillary     Status: Abnormal   Collection Time: 05/07/22  9:28 PM  Result Value Ref Range   Glucose-Capillary 269 (H) 70 - 99 mg/dL    Comment: Glucose reference range applies only to samples taken after fasting for at least 8 hours.   Comment 1 Notify RN    Comment 2 Document in Chart   Basic metabolic panel     Status: Abnormal   Collection Time: 05/08/22  1:17 AM  Result Value Ref Range   Sodium 138 135 - 145 mmol/L   Potassium 4.4 3.5 - 5.1 mmol/L   Chloride 111 98 - 111 mmol/L   CO2 18 (L)  22 - 32 mmol/L   Glucose, Bld 319 (H) 70 - 99 mg/dL    Comment: Glucose reference range applies only to samples taken after fasting for at least 8 hours.   BUN 29 (H) 8 - 23 mg/dL   Creatinine, Ser 2.46 (H) 0.61 - 1.24 mg/dL   Calcium 7.2 (L) 8.9 - 10.3 mg/dL   GFR, Estimated 28 (L) >60 mL/min    Comment: (NOTE) Calculated using the CKD-EPI Creatinine Equation (2021)    Anion gap 9 5 - 15    Comment: Performed at [facility] [address] [place], [place] [address]  Magnesium     Status: None   Collection Time: 05/08/22  1:17 AM  Result Value Ref Range   Magnesium 1.9 1.7 - 2.4 mg/dL    Comment: Performed at [facility] [address], [place], [place] [address]  Glucose, capillary     Status: Abnormal   Collection Time: 05/08/22  6:23 AM  Result Value Ref Range   Glucose-Capillary 332 (H) 70 - 99 mg/dL    Comment: Glucose reference range applies only to samples taken after fasting for at least 8  hours.   Comment 1 Notify RN    Comment 2 Document in Chart   Glucose, capillary     Status: Abnormal   Collection Time: 05/08/22 11:28 AM  Result Value Ref Range   Glucose-Capillary 211 (H) 70 - 99 mg/dL    Comment: Glucose reference range applies only to samples taken after fasting for at least 8 hours.  Glucose, capillary     Status: Abnormal   Collection Time: 05/08/22  3:39 PM  Result Value Ref Range   Glucose-Capillary 225 (H) 70 - 99 mg/dL    Comment: Glucose reference range applies only to samples taken after fasting for at least 8 hours.  Glucose, capillary     Status: Abnormal   Collection Time: 05/08/22  9:11 PM  Result Value Ref Range   Glucose-Capillary 295 (H) 70 - 99 mg/dL    Comment: Glucose reference range applies only to samples taken after fasting for at least 8 hours.   Comment 1 Notify RN    Comment 2 Document in Chart   Basic metabolic panel     Status: Abnormal   Collection Time: 05/09/22  1:21 AM  Result Value Ref Range   Sodium 138 135 - 145 mmol/L   Potassium 4.5 3.5 - 5.1 mmol/L   Chloride 108 98 - 111 mmol/L   CO2 16 (L) 22 - 32 mmol/L   Glucose, Bld 295 (H) 70 - 99 mg/dL    Comment: Glucose reference range applies only to samples taken after fasting for at least 8 hours.   BUN 29 (H) 8 - 23 mg/dL   Creatinine, Ser 2.07 (H) 0.61 - 1.24 mg/dL   Calcium 7.7 (L) 8.9 - 10.3 mg/dL   GFR, Estimated 34 (L) >60 mL/min    Comment: (NOTE) Calculated using the CKD-EPI Creatinine Equation (2021)    Anion gap 14 5 - 15    Comment: Performed at [facility] [address], [place], [place] [address]  Glucose, capillary     Status: Abnormal   Collection Time: 05/09/22  6:01 AM  Result Value Ref Range   Glucose-Capillary 136 (H) 70 - 99 mg/dL    Comment: Glucose reference range applies only to samples taken after fasting for at least 8 hours.   Comment 1 Notify RN  Comment 2 Document in Chart   POCT Activated clotting time     Status: None    Collection Time: 05/09/22 11:12 AM  Result Value Ref Range   Activated Clotting Time 233 seconds    Comment: Reference range 74-137 seconds for patients not on anticoagulant therapy.  POCT Activated clotting time     Status: None   Collection Time: 05/09/22 11:24 AM  Result Value Ref Range   Activated Clotting Time 251 seconds    Comment: Reference range 74-137 seconds for patients not on anticoagulant therapy.  POCT Activated clotting time     Status: None   Collection Time: 05/09/22 11:40 AM  Result Value Ref Range   Activated Clotting Time 281 seconds    Comment: Reference range 74-137 seconds for patients not on anticoagulant therapy.  Glucose, capillary     Status: Abnormal   Collection Time: 05/09/22 12:28 PM  Result Value Ref Range   Glucose-Capillary 112 (H) 70 - 99 mg/dL    Comment: Glucose reference range applies only to samples taken after fasting for at least 8 hours.  Glucose, capillary     Status: Abnormal   Collection Time: 05/09/22  6:22 PM  Result Value Ref Range   Glucose-Capillary 108 (H) 70 - 99 mg/dL    Comment: Glucose reference range applies only to samples taken after fasting for at least 8 hours.  Glucose, capillary     Status: None   Collection Time: 05/09/22  9:06 PM  Result Value Ref Range   Glucose-Capillary 97 70 - 99 mg/dL    Comment: Glucose reference range applies only to samples taken after fasting for at least 8 hours.  Basic metabolic panel     Status: Abnormal   Collection Time: 05/10/22  3:07 AM  Result Value Ref Range   Sodium 137 135 - 145 mmol/L   Potassium 5.5 (H) 3.5 - 5.1 mmol/L   Chloride 109 98 - 111 mmol/L   CO2 18 (L) 22 - 32 mmol/L   Glucose, Bld 110 (H) 70 - 99 mg/dL    Comment: Glucose reference range applies only to samples taken after fasting for at least 8 hours.   BUN 27 (H) 8 - 23 mg/dL   Creatinine, Ser 2.28 (H) 0.61 - 1.24 mg/dL   Calcium 8.2 (L) 8.9 - 10.3 mg/dL   GFR, Estimated 31 (L) >60 mL/min    Comment:  (NOTE) Calculated using the CKD-EPI Creatinine Equation (2021)    Anion gap 10 5 - 15    Comment: Performed at [facility] [address] [place], [place] [address]  Magnesium     Status: Abnormal   Collection Time: 05/10/22  3:07 AM  Result Value Ref Range   Magnesium 1.6 (L) 1.7 - 2.4 mg/dL    Comment: Performed at [facility] [address] [place] [address]  CBC     Status: Abnormal   Collection Time: 05/10/22  3:07 AM  Result Value Ref Range   WBC 14.2 (H) 4.0 - 10.5 K/uL   RBC 3.97 (L) 4.22 - 5.81 MIL/uL   Hemoglobin 11.1 (L) 13.0 - 17.0 g/dL   HCT 33.1 (L) 39.0 - 52.0 %   MCV 83.4 80.0 - 100.0 fL   MCH 28.0 26.0 - 34.0 pg   MCHC 33.5 30.0 - 36.0 g/dL   RDW 15.1 11.5 - 15.5 %   Platelets 575 (H) 150 - 400 K/uL   nRBC 0.0 0.0 - 0.2 %  Comment: Performed at [facility], [place] [address] [place], [place] [address]  Lipoprotein A (LPA)     Status: Abnormal   Collection Time: 05/10/22  3:07 AM  Result Value Ref Range   Lipoprotein (a) 123.4 (H) <75.0 nmol/L    Comment: (NOTE) Note:  Values greater than or equal to 75.0 nmol/L may       indicate an independent risk factor for CHD,       but must be evaluated with caution when applied       to non-Caucasian populations due to the       influence of genetic factors on Lp(a) across       ethnicities. Performed At: [facility] [place], [place] [id number] Rush Farmer MD [id number]   Glucose, capillary     Status: Abnormal   Collection Time: 05/10/22  7:49 AM  Result Value Ref Range   Glucose-Capillary 106 (H) 70 - 99 mg/dL    Comment: Glucose reference range applies only to samples taken after fasting for at least 8 hours.  Glucose, capillary     Status: Abnormal   Collection Time: 05/10/22 11:28 AM  Result Value Ref Range   Glucose-Capillary 166 (H) 70 - 99 mg/dL    Comment: Glucose reference range applies only to samples taken after fasting for at least 8  hours.  Glucose, capillary     Status: Abnormal   Collection Time: 05/10/22  4:33 PM  Result Value Ref Range   Glucose-Capillary 149 (H) 70 - 99 mg/dL    Comment: Glucose reference range applies only to samples taken after fasting for at least 8 hours.  Glucose, capillary     Status: Abnormal   Collection Time: 05/10/22  9:31 PM  Result Value Ref Range   Glucose-Capillary 157 (H) 70 - 99 mg/dL    Comment: Glucose reference range applies only to samples taken after fasting for at least 8 hours.  Basic metabolic panel     Status: Abnormal   Collection Time: 05/11/22  3:24 AM  Result Value Ref Range   Sodium 138 135 - 145 mmol/L   Potassium 5.0 3.5 - 5.1 mmol/L   Chloride 116 (H) 98 - 111 mmol/L   CO2 14 (L) 22 - 32 mmol/L   Glucose, Bld 197 (H) 70 - 99 mg/dL    Comment: Glucose reference range applies only to samples taken after fasting for at least 8 hours.   BUN 33 (H) 8 - 23 mg/dL   Creatinine, Ser 2.65 (H) 0.61 - 1.24 mg/dL   Calcium 7.7 (L) 8.9 - 10.3 mg/dL   GFR, Estimated 26 (L) >60 mL/min    Comment: (NOTE) Calculated using the CKD-EPI Creatinine Equation (2021)    Anion gap 8 5 - 15    Comment: Performed at [facility] [address], [place],  [address]  Magnesium     Status: Abnormal   Collection Time: 05/11/22  3:24 AM  Result Value Ref Range   Magnesium 2.6 (H) 1.7 - 2.4 mg/dL    Comment: Performed at [facility] [address] [place], [place] [address]  Glucose, capillary     Status: Abnormal   Collection Time: 05/11/22  8:13 AM  Result Value Ref Range   Glucose-Capillary 153 (H) 70 - 99 mg/dL    Comment: Glucose reference range applies only to samples taken after fasting for at least 8 hours.  Glucose, capillary  Status: Abnormal   Collection Time: 05/11/22 12:58 PM  Result Value Ref Range   Glucose-Capillary 145 (H) 70 - 99 mg/dL    Comment: Glucose reference range applies only to samples taken after fasting for at least 8 hours.   HgB A1c     Status: Abnormal   Collection Time: 05/13/22  9:01 AM  Result Value Ref Range   Hemoglobin A1C 6.2 (A) 4.0 - 5.6 %   HbA1c POC (<> result, manual entry)     HbA1c, POC (prediabetic range)     HbA1c, POC (controlled diabetic range)          Assessment & Plan:   1) Type 2 diabetes mellitus with stage 4 chronic kidney disease, with long-term current use of insulin (HCC)  - Fines Kimberlin has currently uncontrolled symptomatic type 2 DM since 66 years of age.  He presents today, accompanied by care attendant from the nursing facility, with his logs showing at goal glycemic profile.  His POCT A1c today is 6.2%, increasing from last visit of 5.6%.  He does have anemia which can make his A1c result unreliable as a means of determining control of diabetes.  He was recently hospitalized for heart arrhythmia.  His MAR from the facility shows 2 different long acting insulins as active but when confirmed with his nurse, he has only been getting the Admelog.  -This patient has history of heavy alcohol use/abuse which might have contributed to the pathogenesis of his diabetes. -I had a long discussion with him about the progressive nature of type 2 diabetes and the pathology behind its complications.   -his diabetes is complicated by peripheral arterial disease status post bilateral above-knee amputation -sedentary life, renal insufficiency, and he remains at extremely high risk for more acute and chronic complications which include CAD, CVA, CKD, retinopathy, and neuropathy. These are all discussed in detail with him.  - Nutritional counseling repeated at each appointment due to patients tendency to fall back in to old habits.  - The patient admits there is a room for improvement in their diet and drink choices. -  Suggestion is made for the patient to avoid simple carbohydrates from their diet including Cakes, Sweet Desserts / Pastries, Ice Cream, Soda (diet and regular), Sweet Tea,  Candies, Chips, Cookies, Sweet Pastries, Store Bought Juices, Alcohol in Excess of 1-2 drinks a day, Artificial Sweeteners, Coffee Creamer, and "Sugar-free" Products. This will help patient to have stable blood glucose profile and potentially avoid unintended weight gain.   - I encouraged the patient to switch to unprocessed or minimally processed complex starch and increased protein intake (animal or plant source), fruits, and vegetables.   - Patient is advised to stick to a routine mealtimes to eat 3 meals a day and avoid unnecessary snacks (to snack only to correct hypoglycemia).  - I have approached him with the following individualized plan to manage diabetes and patient agrees:   -#1 priority in his care will be to avoid hypoglycemia.  -He is advised to continue his Admelog 16 units SQ nightly and Humalog 0-6 units TID with meals if glucose is above 90 and he is eating (Specific instructions on how to titrate insulin dosage based on glucose readings given to patient in writing).   -He is encouraged to continue monitoring blood glucose 4 times daily, before meals and before bed, and call the clinic if readings are less than 70 or greater than 200 for 3 tests in a row.  -In this patient  with high likelihood of pancreatic diabetes, tight glycemic profile is not advised as he may be lacking endogenous glucagon response which puts him at high risk for hypoglycemia.  - he is not a candidate for Metformin, SGLT2 inhibitors due to concurrent renal insufficiency. -He is not a candidate for incretin therapy due to his background history of alcoholism with possible risk of pancreatitis.  - Patient specific target  A1c;  LDL, HDL, Triglycerides, and  Waist Circumference were discussed in detail.  2) Blood Pressure /Hypertension: -His blood pressure is controlled to target. He is advised to continue Metoprolol 100 mg po daily, Hydralazine 25 mg TID and Lasix 20 mg po daily.  3)  Lipids/Hyperlipidemia: His most recent lipid panel from 05/06/22 shows controlled LDL of 12.  He is advised to continue Lipitor 40 mg po daily at bedtime.  Side effects and precautions discussed with him.  He is advised to avoid fried foods and butter.  4)  Weight/Diet: -   His There is no height or weight on file to calculate BMI.  CDE Consult has been initiated . Exercise, and detailed carbohydrates information provided  -  detailed on discharge instructions.  He cannot exercise optimally, wheelchair-bound due to bilateral above-knee amputations.  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  He had his TSH checked during his recent admission which showed TSH of 4.31 which is likely due to irregularities with taking his medication.  He is advised to continue Levothyroxine 100 mcg po daily before breakfast (unsure who increased him to this dose or when it happened).  He is high risk for negative outcomes for over-replacement given his history of heart arrhythmias, therefore medication adjustments should be performed with caution.  Will recheck TFTs prior to next visit and adjust dose if needed.   - We discussed about the correct intake of his thyroid hormone, on empty stomach at fasting, with water, separated by at least 30 minutes from breakfast and other medications,  and separated by more than 4 hours from calcium, iron, multivitamins, acid reflux medications (PPIs). -Patient is made aware of the fact that thyroid hormone replacement is needed for life, dose to be adjusted by periodic monitoring of thyroid function tests.  6) Vitamin D deficiency: -Last vitamin D level on 08/06/20 was 52.3.  He is advised to continue vitamin D3 5000 units daily as maintenance dose until next measurement.  7) Chronic Care/Health Maintenance: -he is on statin medications and is encouraged to initiate and continue to follow up with  Ophthalmology, Dentist,  Podiatrist at least yearly or according to recommendations, and advised to stay away from smoking. I have recommended yearly flu vaccine and pneumonia vaccine at least every 5 years;  and  sleep for at least 7 hours a day.  -He cannot exercise optimally- WC bound from bilateral AKA.   - he is  advised to maintain close follow up with [name], DO for primary care needs, as well as his other providers for optimal and coordinated care.     I spent 44 minutes in the care of the patient today including review of labs from [place], Lipids, Thyroid Function, Hematology (current and previous including abstractions from other facilities); face-to-face time discussing  his blood glucose readings/logs, discussing hypoglycemia and hyperglycemia episodes and symptoms, medications doses, his options of short and long term treatment based on the latest standards of care / guidelines;  discussion about incorporating lifestyle medicine;  and  documenting the encounter. Risk reduction counseling performed per USPSTF guidelines to reduce obesity and cardiovascular risk factors.     Please refer to Patient Instructions for Blood Glucose Monitoring and Insulin/Medications Dosing Guide"  in media tab for additional information. Please  also refer to " Patient Self Inventory" in the Media  tab for reviewed elements of pertinent patient history.  [name] participated in the discussions, expressed understanding, and voiced agreement with the above plans.  All questions were answered to his satisfaction. he is encouraged to contact clinic should he have any questions or concerns prior to his return visit.   Follow up plan: - Return in about 4 months (around 09/12/2022) for Diabetes F/U with A1c in office, Thyroid follow up, Previsit labs, Bring meter and logs.  Rayetta Pigg, [facility] [address] [place], [place] [address] Phone:  [phone] Fax: [phone]  05/13/2022, 9:23 AM

## 2022-05-14 ENCOUNTER — Emergency Department (HOSPITAL_COMMUNITY): Payer: Medicare (Managed Care)

## 2022-05-14 ENCOUNTER — Other Ambulatory Visit: Payer: Self-pay

## 2022-05-14 ENCOUNTER — Inpatient Hospital Stay (HOSPITAL_COMMUNITY)
Admission: EM | Admit: 2022-05-14 | Discharge: 2022-05-17 | DRG: 682 | Disposition: A | Discharge status: Skilled Nursing Facility | Payer: Medicare (Managed Care) | Attending: Internal Medicine | Admitting: Internal Medicine

## 2022-05-14 ENCOUNTER — Encounter (HOSPITAL_COMMUNITY): Payer: Self-pay | Admitting: Emergency Medicine

## 2022-05-14 DIAGNOSIS — N184 Chronic kidney disease, stage 4 (severe): Secondary | ICD-10-CM | POA: Diagnosis present

## 2022-05-14 DIAGNOSIS — Z888 Allergy status to other drugs, medicaments and biological substances status: Secondary | ICD-10-CM

## 2022-05-14 DIAGNOSIS — E039 Hypothyroidism, unspecified: Secondary | ICD-10-CM | POA: Diagnosis present

## 2022-05-14 DIAGNOSIS — D72829 Elevated white blood cell count, unspecified: Secondary | ICD-10-CM

## 2022-05-14 DIAGNOSIS — E872 Acidosis, unspecified: Secondary | ICD-10-CM | POA: Diagnosis present

## 2022-05-14 DIAGNOSIS — I2510 Atherosclerotic heart disease of native coronary artery without angina pectoris: Secondary | ICD-10-CM | POA: Diagnosis present

## 2022-05-14 DIAGNOSIS — Z89611 Acquired absence of right leg above knee: Secondary | ICD-10-CM

## 2022-05-14 DIAGNOSIS — F17210 Nicotine dependence, cigarettes, uncomplicated: Secondary | ICD-10-CM | POA: Diagnosis present

## 2022-05-14 DIAGNOSIS — S22000A Wedge compression fracture of unspecified thoracic vertebra, initial encounter for closed fracture: Secondary | ICD-10-CM | POA: Insufficient documentation

## 2022-05-14 DIAGNOSIS — E8809 Other disorders of plasma-protein metabolism, not elsewhere classified: Secondary | ICD-10-CM | POA: Diagnosis present

## 2022-05-14 DIAGNOSIS — E440 Moderate protein-calorie malnutrition: Secondary | ICD-10-CM | POA: Diagnosis present

## 2022-05-14 DIAGNOSIS — D631 Anemia in chronic kidney disease: Secondary | ICD-10-CM | POA: Diagnosis present

## 2022-05-14 DIAGNOSIS — Z6829 Body mass index (BMI) 29.0-29.9, adult: Secondary | ICD-10-CM

## 2022-05-14 DIAGNOSIS — Z79890 Hormone replacement therapy (postmenopausal): Secondary | ICD-10-CM

## 2022-05-14 DIAGNOSIS — S22080A Wedge compression fracture of T11-T12 vertebra, initial encounter for closed fracture: Secondary | ICD-10-CM

## 2022-05-14 DIAGNOSIS — I482 Chronic atrial fibrillation, unspecified: Secondary | ICD-10-CM | POA: Diagnosis present

## 2022-05-14 DIAGNOSIS — I10 Essential (primary) hypertension: Secondary | ICD-10-CM | POA: Diagnosis present

## 2022-05-14 DIAGNOSIS — E46 Unspecified protein-calorie malnutrition: Secondary | ICD-10-CM | POA: Diagnosis present

## 2022-05-14 DIAGNOSIS — D75839 Thrombocytosis, unspecified: Secondary | ICD-10-CM | POA: Diagnosis present

## 2022-05-14 DIAGNOSIS — N179 Acute kidney failure, unspecified: Principal | ICD-10-CM | POA: Diagnosis present

## 2022-05-14 DIAGNOSIS — E1122 Type 2 diabetes mellitus with diabetic chronic kidney disease: Secondary | ICD-10-CM | POA: Diagnosis present

## 2022-05-14 DIAGNOSIS — M109 Gout, unspecified: Secondary | ICD-10-CM | POA: Diagnosis present

## 2022-05-14 DIAGNOSIS — I5042 Chronic combined systolic (congestive) and diastolic (congestive) heart failure: Secondary | ICD-10-CM | POA: Diagnosis present

## 2022-05-14 DIAGNOSIS — I130 Hypertensive heart and chronic kidney disease with heart failure and stage 1 through stage 4 chronic kidney disease, or unspecified chronic kidney disease: Secondary | ICD-10-CM | POA: Diagnosis present

## 2022-05-14 DIAGNOSIS — E785 Hyperlipidemia, unspecified: Secondary | ICD-10-CM | POA: Diagnosis present

## 2022-05-14 DIAGNOSIS — Z79899 Other long term (current) drug therapy: Secondary | ICD-10-CM

## 2022-05-14 DIAGNOSIS — Z7902 Long term (current) use of antithrombotics/antiplatelets: Secondary | ICD-10-CM

## 2022-05-14 DIAGNOSIS — Z794 Long term (current) use of insulin: Secondary | ICD-10-CM

## 2022-05-14 DIAGNOSIS — Z833 Family history of diabetes mellitus: Secondary | ICD-10-CM

## 2022-05-14 DIAGNOSIS — Z8249 Family history of ischemic heart disease and other diseases of the circulatory system: Secondary | ICD-10-CM

## 2022-05-14 DIAGNOSIS — Z89612 Acquired absence of left leg above knee: Secondary | ICD-10-CM

## 2022-05-14 DIAGNOSIS — Z7901 Long term (current) use of anticoagulants: Secondary | ICD-10-CM

## 2022-05-14 DIAGNOSIS — M4854XA Collapsed vertebra, not elsewhere classified, thoracic region, initial encounter for fracture: Secondary | ICD-10-CM | POA: Diagnosis present

## 2022-05-14 DIAGNOSIS — L89322 Pressure ulcer of left buttock, stage 2: Secondary | ICD-10-CM | POA: Diagnosis present

## 2022-05-14 DIAGNOSIS — J690 Pneumonitis due to inhalation of food and vomit: Secondary | ICD-10-CM | POA: Diagnosis not present

## 2022-05-14 LAB — LIPASE, BLOOD: Lipase: 29 U/L (ref 11–51)

## 2022-05-14 LAB — CBC WITH DIFFERENTIAL/PLATELET
Abs Immature Granulocytes: 0.7 10*3/uL — ABNORMAL HIGH (ref 0.00–0.07)
Basophils Absolute: 0.2 10*3/uL — ABNORMAL HIGH (ref 0.0–0.1)
Basophils Relative: 0 %
Eosinophils Absolute: 0.1 10*3/uL (ref 0.0–0.5)
Eosinophils Relative: 0 %
HCT: 36 % — ABNORMAL LOW (ref 39.0–52.0)
Hemoglobin: 11.9 g/dL — ABNORMAL LOW (ref 13.0–17.0)
Immature Granulocytes: 1 %
Lymphocytes Relative: 6 %
Lymphs Abs: 2.8 10*3/uL (ref 0.7–4.0)
MCH: 27.8 pg (ref 26.0–34.0)
MCHC: 33.1 g/dL (ref 30.0–36.0)
MCV: 84.1 fL (ref 80.0–100.0)
Monocytes Absolute: 1.7 10*3/uL — ABNORMAL HIGH (ref 0.1–1.0)
Monocytes Relative: 3 %
Neutro Abs: 44.5 10*3/uL — ABNORMAL HIGH (ref 1.7–7.7)
Neutrophils Relative %: 90 %
Platelets: 742 10*3/uL — ABNORMAL HIGH (ref 150–400)
RBC: 4.28 MIL/uL (ref 4.22–5.81)
RDW: 15.5 % (ref 11.5–15.5)
WBC: 49.8 10*3/uL — ABNORMAL HIGH (ref 4.0–10.5)
nRBC: 0 % (ref 0.0–0.2)

## 2022-05-14 LAB — COMPREHENSIVE METABOLIC PANEL
ALT: 25 U/L (ref 0–44)
AST: 35 U/L (ref 15–41)
Albumin: 2.9 g/dL — ABNORMAL LOW (ref 3.5–5.0)
Alkaline Phosphatase: 198 U/L — ABNORMAL HIGH (ref 38–126)
Anion gap: 11 (ref 5–15)
BUN: 48 mg/dL — ABNORMAL HIGH (ref 8–23)
CO2: 13 mmol/L — ABNORMAL LOW (ref 22–32)
Calcium: 8.2 mg/dL — ABNORMAL LOW (ref 8.9–10.3)
Chloride: 117 mmol/L — ABNORMAL HIGH (ref 98–111)
Creatinine, Ser: 3.73 mg/dL — ABNORMAL HIGH (ref 0.61–1.24)
GFR, Estimated: 17 mL/min — ABNORMAL LOW (ref >60)
Glucose, Bld: 137 mg/dL — ABNORMAL HIGH (ref 70–99)
Potassium: 5 mmol/L (ref 3.5–5.1)
Sodium: 141 mmol/L (ref 135–145)
Total Bilirubin: 0.8 mg/dL (ref 0.3–1.2)
Total Protein: 8.5 g/dL — ABNORMAL HIGH (ref 6.5–8.1)

## 2022-05-14 LAB — URINALYSIS, ROUTINE W REFLEX MICROSCOPIC
Bacteria, UA: NONE SEEN
Bilirubin Urine: NEGATIVE
Glucose, UA: NEGATIVE mg/dL
Hgb urine dipstick: NEGATIVE
Ketones, ur: NEGATIVE mg/dL
Leukocytes,Ua: NEGATIVE
Nitrite: NEGATIVE
Protein, ur: 100 mg/dL — AB
Specific Gravity, Urine: 1.026 (ref 1.005–1.030)
pH: 5 (ref 5.0–8.0)

## 2022-05-14 MED ORDER — LACTATED RINGERS IV BOLUS
500.0000 mL | Freq: Once | INTRAVENOUS | Status: AC
  Administered 2022-05-15: 500 mL via INTRAVENOUS

## 2022-05-14 MED ORDER — LIDOCAINE 5 % EX PTCH
1.0000 | MEDICATED_PATCH | CUTANEOUS | Status: DC
  Administered 2022-05-14 – 2022-05-16 (×3): 1 via TRANSDERMAL
  Filled 2022-05-14 (×3): qty 1

## 2022-05-14 MED ORDER — ACETAMINOPHEN 500 MG PO TABS
1000.0000 mg | ORAL_TABLET | Freq: Once | ORAL | Status: AC
  Administered 2022-05-14: 1000 mg via ORAL
  Filled 2022-05-14: qty 2

## 2022-05-14 NOTE — ED Triage Notes (Addendum)
Pt bib EMS from [facility] with c/o emesis and back pain x 1 week. States he vomited once today. Pt states the facility gave him a muscle relaxer today but that he didn't experience much relief with it.

## 2022-05-14 NOTE — ED Provider Notes (Signed)
[facility] EMERGENCY DEPARTMENT Provider Note   CSN: [id number] Arrival date & time: 05/14/22  1944     History {Add pertinent medical, surgical, social history, OB history to HPI:1} Chief Complaint  Patient presents with   Emesis   Back Pain    Todd [name] is a 66 y.o. male.  66 year old male with a history of atrial fibrillation, HFrEF, CKD, PAD status post bilateral AKA, and diabetes who presents to the emergency department with flank pain and nausea and vomiting.  States that for the past several days has had sharp left flank pain.  Says it is constant and worsened with movement.  Denies any known injuries.  Denies any fevers.  Did have episode of vomiting today that prompted him to come into the emergency department.  Denies diarrhea or constipation, frequency, urgency.        Home Medications Prior to Admission medications   Medication Sig Start Date End Date Taking? Authorizing Provider  acetaminophen (TYLENOL) 325 MG tablet Take 2 tablets (650 mg total) by mouth every 6 (six) hours as needed for mild pain (or Fever >/= 101). Patient taking differently: Take 650 mg by mouth every 6 (six) hours as needed for moderate pain. 01/15/21   Johnson, Clanford L, MD  allopurinol (ZYLOPRIM) 100 MG tablet Take 1 tablet (100 mg total) by mouth daily. Patient taking differently: Take 100 mg by mouth in the morning. 12/06/20   Murlean Iba, MD  apixaban (ELIQUIS) 5 MG TABS tablet Take 1 tablet (5 mg total) by mouth 2 (two) times daily. 01/16/21   Johnson, Clanford L, MD  Ascorbic Acid 500 MG CAPS Take 500 mg by mouth 2 (two) times daily.    [provider]  atorvastatin (LIPITOR) 80 MG tablet Take 1 tablet (80 mg total) by mouth daily. 05/12/22   Margie Billet, PA-C  calcium carbonate (TUMS - DOSED IN MG ELEMENTAL CALCIUM) 500 MG chewable tablet Chew 1,000 mg by mouth 3 (three) times daily before meals.    [provider]  Cholecalciferol (VITAMIN D3) 50 MCG  (2000 UT) capsule Take 2,000 Units by mouth in the morning.    [provider]  clopidogrel (PLAVIX) 75 MG tablet Take 1 tablet (75 mg total) by mouth daily with breakfast. 05/12/22   Margie Billet, PA-C  cyanocobalamin (V-R VITAMIN B-12) 500 MCG tablet Take 1 tablet (500 mcg total) by mouth daily. Patient taking differently: Take 500 mcg by mouth in the morning. 02/09/22   Harriett Rush, PA-C  folic acid (FOLVITE) 1 MG tablet Take 1 mg by mouth in the morning.    [provider]  Glucagon, rDNA, (GLUCAGON EMERGENCY) 1 MG KIT Inject 1 mg into the muscle as needed (hypoglycemia). 10/30/21   [provider]  hydrALAZINE (APRESOLINE) 50 MG tablet Take 1 tablet (50 mg total) by mouth every 8 (eight) hours. 05/11/22   Margie Billet, PA-C  insulin lispro (ADMELOG SOLOSTAR) 100 UNIT/ML KwikPen Inject 16 Units into the skin at bedtime.    [provider]  insulin lispro (HUMALOG) 100 UNIT/ML KwikPen Inject 3-9 Units into the skin 3 (three) times daily. Sliding scale 90-150-3 units, 151-200- 4units, 200-250 5units, 251-300 6units, 301-350 7units, 351-400 8units,401-450 9units Patient taking differently: Inject 0-6 Units into the skin See admin instructions. Inject 0-10 units 3 times daily before meals and at bedtime per sliding scale: CBG < 150 : 0 units CBG 151-200 : 1 units CBG 201-250 : 2 units CBG 251-300 :  3 units CBG 301-350 : 4 units CBG 351-400 : 5 units (over 400 give 10 units and call provider) CBG 401-500 : 6 units 01/11/22   Brita Romp, NP  isosorbide mononitrate (IMDUR) 60 MG 24 hr tablet Take 1 tablet (60 mg total) by mouth daily. 05/12/22   Margie Billet, PA-C  levothyroxine (SYNTHROID) 100 MCG tablet Take 100 mcg by mouth daily before breakfast. 03/29/22   [provider]  metoprolol succinate (TOPROL-XL) 100 MG 24 hr tablet Take 1 tablet (100 mg total) by mouth daily. Take with or immediately following a  meal. Patient taking differently: Take 100 mg by mouth in the morning. 05/12/21   Patrecia Pour, MD  mexiletine (MEXITIL) 150 MG capsule Take 1 capsule (150 mg total) by mouth every 8 (eight) hours. 05/11/22   Margie Billet, PA-C  mirtazapine (REMERON) 7.5 MG tablet Take 7.5 mg by mouth at bedtime. 12/23/21   [provider]  Multiple Vitamin (MULTIVITAMIN WITH MINERALS) TABS tablet Take 1 tablet by mouth daily. Patient taking differently: Take 1 tablet by mouth in the morning. 01/16/21   Johnson, Clanford L, MD  Nutritional Supplements (NUTRITIONAL DRINK) LIQD Take 240 mLs by mouth daily at 12 noon. House Supplement    [provider]  pantoprazole (PROTONIX) 40 MG tablet Take 1 tablet (40 mg total) by mouth 2 (two) times daily. Patient taking differently: Take 40 mg by mouth in the morning. 01/15/21   Johnson, Clanford L, MD  polyethylene glycol (MIRALAX / GLYCOLAX) 17 g packet Take 17 g by mouth daily. Patient taking differently: Take 17 g by mouth in the morning. 01/16/21   Johnson, Clanford L, MD  sertraline (ZOLOFT) 50 MG tablet Take 1 tablet (50 mg total) by mouth at bedtime. Patient taking differently: Take 50 mg by mouth in the morning. 12/06/20   Irwin Brakeman L, MD  sodium bicarbonate 650 MG tablet Take 650 mg by mouth 3 (three) times daily before meals.    [provider]  thiamine 100 MG tablet Take 1 tablet (100 mg total) by mouth daily. Patient taking differently: Take 100 mg by mouth in the morning. 12/29/20   Roxan Hockey, MD      Allergies    Lisinopril    Review of Systems   Review of Systems  Physical Exam Updated Vital Signs BP 134/71   Pulse 87   Temp 98 F (36.7 C) (Oral)   Resp 20   Ht 5' (1.524 m)   Wt 68 kg   SpO2 98%   BMI 29.28 kg/m  Physical Exam Vitals and nursing note reviewed.  Constitutional:      General: He is not in acute distress.    Appearance: He is well-developed.  HENT:     Head: Normocephalic and  atraumatic.     Right Ear: External ear normal.     Left Ear: External ear normal.     Nose: Nose normal.  Eyes:     Extraocular Movements: Extraocular movements intact.     Conjunctiva/sclera: Conjunctivae normal.     Pupils: Pupils are equal, round, and reactive to light.  Cardiovascular:     Rate and Rhythm: Normal rate and regular rhythm.  Pulmonary:     Effort: Pulmonary effort is normal. No respiratory distress.  Abdominal:     General: There is no distension.     Palpations: Abdomen is soft. There is no mass.     Tenderness: There is no abdominal tenderness. There  is left CVA tenderness. There is no right CVA tenderness or guarding.  Musculoskeletal:     Cervical back: Normal range of motion and neck supple.     Comments: No midline C-spine tenderness to palpation or step-offs noted.  Bilateral AKA's present  Skin:    General: Skin is warm and dry.     Capillary Refill: Capillary refill takes less than 2 seconds.  Neurological:     Mental Status: He is alert. Mental status is at baseline.  Psychiatric:        Mood and Affect: Mood normal.        Behavior: Behavior normal.     ED Results / Procedures / Treatments   Labs (all labs ordered are listed, but only abnormal results are displayed) Labs Reviewed - No data to display  EKG None  Radiology No results found.  Procedures Procedures  {Document cardiac monitor, telemetry assessment procedure when appropriate:1}  Medications Ordered in ED Medications - No data to display  ED Course/ Medical Decision Making/ A&P                           Medical Decision Making Amount and/or Complexity of Data Reviewed Labs: ordered. Radiology: ordered.  Risk OTC drugs. Prescription drug management.   ***  {Document critical care time when appropriate:1} {Document review of labs and clinical decision tools ie heart score, Chads2Vasc2 etc:1}  {Document your independent review of radiology images, and any outside  records:1} {Document your discussion with family members, caretakers, and with consultants:1} {Document social determinants of health affecting pt's care:1} {Document your decision making why or why not admission, treatments were needed:1} Final Clinical Impression(s) / ED Diagnoses Final diagnoses:  None    Rx / DC Orders ED Discharge Orders     None

## 2022-05-15 DIAGNOSIS — I130 Hypertensive heart and chronic kidney disease with heart failure and stage 1 through stage 4 chronic kidney disease, or unspecified chronic kidney disease: Secondary | ICD-10-CM | POA: Diagnosis present

## 2022-05-15 DIAGNOSIS — E440 Moderate protein-calorie malnutrition: Secondary | ICD-10-CM | POA: Diagnosis present

## 2022-05-15 DIAGNOSIS — I10 Essential (primary) hypertension: Secondary | ICD-10-CM

## 2022-05-15 DIAGNOSIS — L89322 Pressure ulcer of left buttock, stage 2: Secondary | ICD-10-CM | POA: Diagnosis present

## 2022-05-15 DIAGNOSIS — S22000A Wedge compression fracture of unspecified thoracic vertebra, initial encounter for closed fracture: Secondary | ICD-10-CM | POA: Insufficient documentation

## 2022-05-15 DIAGNOSIS — N189 Chronic kidney disease, unspecified: Secondary | ICD-10-CM

## 2022-05-15 DIAGNOSIS — Z794 Long term (current) use of insulin: Secondary | ICD-10-CM | POA: Diagnosis not present

## 2022-05-15 DIAGNOSIS — Z833 Family history of diabetes mellitus: Secondary | ICD-10-CM | POA: Diagnosis not present

## 2022-05-15 DIAGNOSIS — Z7901 Long term (current) use of anticoagulants: Secondary | ICD-10-CM | POA: Diagnosis not present

## 2022-05-15 DIAGNOSIS — D75839 Thrombocytosis, unspecified: Secondary | ICD-10-CM | POA: Diagnosis present

## 2022-05-15 DIAGNOSIS — I5042 Chronic combined systolic (congestive) and diastolic (congestive) heart failure: Secondary | ICD-10-CM

## 2022-05-15 DIAGNOSIS — F17210 Nicotine dependence, cigarettes, uncomplicated: Secondary | ICD-10-CM | POA: Diagnosis present

## 2022-05-15 DIAGNOSIS — E872 Acidosis, unspecified: Secondary | ICD-10-CM

## 2022-05-15 DIAGNOSIS — I482 Chronic atrial fibrillation, unspecified: Secondary | ICD-10-CM | POA: Diagnosis present

## 2022-05-15 DIAGNOSIS — E46 Unspecified protein-calorie malnutrition: Secondary | ICD-10-CM

## 2022-05-15 DIAGNOSIS — Z89612 Acquired absence of left leg above knee: Secondary | ICD-10-CM | POA: Diagnosis not present

## 2022-05-15 DIAGNOSIS — Z89611 Acquired absence of right leg above knee: Secondary | ICD-10-CM | POA: Diagnosis not present

## 2022-05-15 DIAGNOSIS — Z8249 Family history of ischemic heart disease and other diseases of the circulatory system: Secondary | ICD-10-CM | POA: Diagnosis not present

## 2022-05-15 DIAGNOSIS — N179 Acute kidney failure, unspecified: Secondary | ICD-10-CM | POA: Diagnosis not present

## 2022-05-15 DIAGNOSIS — E785 Hyperlipidemia, unspecified: Secondary | ICD-10-CM | POA: Diagnosis present

## 2022-05-15 DIAGNOSIS — E8809 Other disorders of plasma-protein metabolism, not elsewhere classified: Secondary | ICD-10-CM | POA: Diagnosis present

## 2022-05-15 DIAGNOSIS — D631 Anemia in chronic kidney disease: Secondary | ICD-10-CM | POA: Diagnosis present

## 2022-05-15 DIAGNOSIS — E039 Hypothyroidism, unspecified: Secondary | ICD-10-CM | POA: Diagnosis present

## 2022-05-15 DIAGNOSIS — J690 Pneumonitis due to inhalation of food and vomit: Secondary | ICD-10-CM | POA: Diagnosis not present

## 2022-05-15 DIAGNOSIS — E1122 Type 2 diabetes mellitus with diabetic chronic kidney disease: Secondary | ICD-10-CM | POA: Diagnosis present

## 2022-05-15 DIAGNOSIS — Z7902 Long term (current) use of antithrombotics/antiplatelets: Secondary | ICD-10-CM | POA: Diagnosis not present

## 2022-05-15 DIAGNOSIS — N184 Chronic kidney disease, stage 4 (severe): Secondary | ICD-10-CM | POA: Diagnosis present

## 2022-05-15 DIAGNOSIS — M4854XA Collapsed vertebra, not elsewhere classified, thoracic region, initial encounter for fracture: Secondary | ICD-10-CM | POA: Diagnosis present

## 2022-05-15 DIAGNOSIS — I739 Peripheral vascular disease, unspecified: Secondary | ICD-10-CM

## 2022-05-15 LAB — CBG MONITORING, ED
Glucose-Capillary: 121 mg/dL — ABNORMAL HIGH (ref 70–99)
Glucose-Capillary: 138 mg/dL — ABNORMAL HIGH (ref 70–99)

## 2022-05-15 LAB — PHOSPHORUS: Phosphorus: 4.4 mg/dL (ref 2.5–4.6)

## 2022-05-15 LAB — PROCALCITONIN: Procalcitonin: 4.44 ng/mL

## 2022-05-15 LAB — GLUCOSE, CAPILLARY
Glucose-Capillary: 149 mg/dL — ABNORMAL HIGH (ref 70–99)
Glucose-Capillary: 113 mg/dL — ABNORMAL HIGH (ref 70–99)

## 2022-05-15 LAB — C-REACTIVE PROTEIN: CRP: 11.3 mg/dL — ABNORMAL HIGH (ref <1.0)

## 2022-05-15 LAB — MAGNESIUM: Magnesium: 2 mg/dL (ref 1.7–2.4)

## 2022-05-15 LAB — SEDIMENTATION RATE: Sed Rate: 72 mm/hr — ABNORMAL HIGH (ref 0–16)

## 2022-05-15 MED ORDER — CLOPIDOGREL BISULFATE 75 MG PO TABS
75.0000 mg | ORAL_TABLET | Freq: Every day | ORAL | Status: DC
  Administered 2022-05-15 – 2022-05-17 (×3): 75 mg via ORAL
  Filled 2022-05-15 (×3): qty 1

## 2022-05-15 MED ORDER — ENSURE ENLIVE PO LIQD
237.0000 mL | Freq: Two times a day (BID) | ORAL | Status: DC
  Filled 2022-05-15 (×6): qty 237

## 2022-05-15 MED ORDER — ATORVASTATIN CALCIUM 40 MG PO TABS
80.0000 mg | ORAL_TABLET | Freq: Every day | ORAL | Status: DC
  Administered 2022-05-15 – 2022-05-17 (×3): 80 mg via ORAL
  Filled 2022-05-15 (×3): qty 2

## 2022-05-15 MED ORDER — SERTRALINE HCL 50 MG PO TABS
50.0000 mg | ORAL_TABLET | Freq: Every day | ORAL | Status: DC
  Administered 2022-05-15 – 2022-05-16 (×2): 50 mg via ORAL
  Filled 2022-05-15 (×2): qty 1

## 2022-05-15 MED ORDER — METOPROLOL SUCCINATE ER 50 MG PO TB24
100.0000 mg | ORAL_TABLET | Freq: Every day | ORAL | Status: DC
  Administered 2022-05-15 – 2022-05-17 (×3): 100 mg via ORAL
  Filled 2022-05-15 (×3): qty 2

## 2022-05-15 MED ORDER — HYDRALAZINE HCL 25 MG PO TABS
50.0000 mg | ORAL_TABLET | Freq: Three times a day (TID) | ORAL | Status: DC
  Administered 2022-05-15 – 2022-05-17 (×7): 50 mg via ORAL
  Filled 2022-05-15 (×7): qty 2

## 2022-05-15 MED ORDER — PANTOPRAZOLE SODIUM 40 MG PO TBEC
40.0000 mg | DELAYED_RELEASE_TABLET | Freq: Two times a day (BID) | ORAL | Status: DC
  Administered 2022-05-15 – 2022-05-17 (×5): 40 mg via ORAL
  Filled 2022-05-15 (×5): qty 1

## 2022-05-15 MED ORDER — INSULIN ASPART 100 UNIT/ML IJ SOLN
0.0000 [IU] | Freq: Three times a day (TID) | INTRAMUSCULAR | Status: DC
  Administered 2022-05-15 – 2022-05-16 (×4): 1 [IU] via SUBCUTANEOUS
  Filled 2022-05-15 (×3): qty 1

## 2022-05-15 MED ORDER — APIXABAN 5 MG PO TABS
5.0000 mg | ORAL_TABLET | Freq: Two times a day (BID) | ORAL | Status: DC
  Administered 2022-05-15 – 2022-05-17 (×5): 5 mg via ORAL
  Filled 2022-05-15 (×5): qty 1

## 2022-05-15 MED ORDER — SODIUM CHLORIDE 0.9 % IV SOLN: INTRAVENOUS | Status: DC

## 2022-05-15 MED ORDER — SODIUM CHLORIDE 0.9 % IV SOLN
2.2500 g | Freq: Four times a day (QID) | INTRAVENOUS | Status: DC
  Administered 2022-05-15 – 2022-05-16 (×4): 2.25 g via INTRAVENOUS
  Filled 2022-05-15 (×9): qty 10

## 2022-05-15 MED ORDER — INSULIN ASPART 100 UNIT/ML IJ SOLN: 0.0000 [IU] | Freq: Every day | INTRAMUSCULAR | Status: DC

## 2022-05-15 MED ORDER — INSULIN GLARGINE-YFGN 100 UNIT/ML ~~LOC~~ SOLN
8.0000 [IU] | Freq: Every day | SUBCUTANEOUS | Status: DC
  Administered 2022-05-15 – 2022-05-16 (×2): 8 [IU] via SUBCUTANEOUS
  Filled 2022-05-15 (×3): qty 0.08

## 2022-05-15 MED ORDER — ISOSORBIDE MONONITRATE ER 60 MG PO TB24
60.0000 mg | ORAL_TABLET | Freq: Every day | ORAL | Status: DC
  Administered 2022-05-15 – 2022-05-17 (×3): 60 mg via ORAL
  Filled 2022-05-15 (×3): qty 1

## 2022-05-15 MED ORDER — ACETAMINOPHEN 325 MG PO TABS: 650.0000 mg | ORAL_TABLET | Freq: Four times a day (QID) | ORAL | Status: DC | PRN

## 2022-05-15 MED ORDER — PIPERACILLIN-TAZOBACTAM IN DEX 2-0.25 GM/50ML IV SOLN
2.2500 g | Freq: Four times a day (QID) | INTRAVENOUS | Status: DC
  Filled 2022-05-15 (×5): qty 50

## 2022-05-15 MED ORDER — HYDROMORPHONE HCL 1 MG/ML IJ SOLN
0.5000 mg | INTRAMUSCULAR | Status: DC | PRN
  Administered 2022-05-15 – 2022-05-16 (×4): 0.5 mg via INTRAVENOUS
  Filled 2022-05-15 (×4): qty 0.5

## 2022-05-15 NOTE — TOC Initial Note (Signed)
Transition of Care [facility]) - Initial/Assessment Note    Patient Details  Name: Todd [name] MRN: [id number] Date of Birth: 05/03/1956  Transition of Care [facility]) CM/SW Contact:    Iona Beard, [place] Phone Number: 05/15/2022, 10:59 AM  Clinical Narrative:                 Pt is high risk for readmission. CSW spoke to [place] with [facility] who states that pt is a long term resident at their facility and will be able to D/C back to [facility] when medically stable. Pt will be able to return and does not need insurance auth. TOC to follow.   Expected Discharge Plan: Long Term Nursing Home Barriers to Discharge: Continued Medical Work up   Patient Goals and CMS Choice Patient states their goals for this hospitalization and ongoing recovery are:: return to LTC CMS Medicare.gov Compare Post Acute Care list provided to:: Patient Choice offered to / list presented to : Patient  Expected Discharge Plan and Services Expected Discharge Plan: [place] In-house Referral: Clinical Social Work Discharge Planning Services: CM Consult Post Acute Care Choice: Nursing Home Living arrangements for the past 2 months: [place]                                      Prior Living Arrangements/Services Living arrangements for the past 2 months: [place] Lives with:: Facility Resident Patient language and need for interpreter reviewed:: Yes Do you feel safe going back to the place where you live?: Yes      Need for Family Participation in Patient Care: Yes (Comment) Care giver support system in place?: Yes (comment)   Criminal Activity/Legal Involvement Pertinent to Current Situation/Hospitalization: No - Comment as needed  Activities of Daily Living      Permission Sought/Granted                  Emotional Assessment         Alcohol / Substance Use: Not Applicable Psych Involvement: No (comment)  Admission diagnosis:  Acute  kidney injury superimposed on chronic kidney disease ([place]) [N17.9, N18.9] Patient Active Problem List   Diagnosis Date Noted   Acute kidney injury superimposed on chronic kidney disease ([place]) 05/15/2022   Compression fracture of body of thoracic vertebra ([place]) 62/22/9798   Acute systolic heart failure ([place]) 05/10/2022   Ventricular tachyarrhythmia ([place]) 05/03/2022   VT (ventricular tachycardia) ([place]) 05/03/2022   Cardiomyopathy ([place]) 08/29/2021   Persistent atrial fibrillation ([place]) 08/29/2021   Stage 3b chronic kidney disease (CKD) ([place]) 08/29/2021   QT prolongation 08/29/2021   CHF (congestive heart failure) ([place]) 05/27/2021   SOB (shortness of breath)    CAP (community acquired pneumonia) 04/20/2021   Elevated brain natriuretic peptide (BNP) level 04/20/2021   Polysubstance abuse ([place]) 04/20/2021   Noncompliance with medication regimen 04/20/2021   Chronic combined systolic and diastolic CHF (congestive heart failure) ([place]) 04/20/2021   Hypoalbuminemia due to protein-calorie malnutrition ([place]) 04/20/2021   GERD (gastroesophageal reflux disease) 04/20/2021   Candida esophagitis (HCC)    Iron deficiency anemia due to chronic blood loss 01/11/2021   Blood in stool    Atrial fibrillation with RVR ([place]) 01/10/2021   Hypomagnesemia 01/10/2021   Severe Hypophosphatemia 12/28/2020   Non-Sustained V-tach in the setting of Severe Electrolyte Derangement 12/28/2020   Cocaine abuse -on-going 12/28/2020   Alcohol abuse, continuous 12/28/2020  Syncope and collapse-suspect arrhythmia related in the setting of severe electrolyte abnormalities 12/28/2020   Type 2 diabetes mellitus with complication, with long-term current use of insulin ([place]) 12/28/2020   Tobacco abuse 12/28/2020   Atrial fibrillation, chronic ([place]) 12/26/2020   Hypocalcemia 80/99/8338   Acute metabolic encephalopathy 25/10/3974   Hypoglycemia 12/05/2020   AKI (acute kidney injury) ([place]) 12/04/2020   Vitamin D deficiency  06/17/2019   Essential hypertension, benign 08/30/2018   Mixed hyperlipidemia 07/17/2018   Hyponatremia 02/14/2018   Hypokalemia 02/14/2018   Medically noncompliant 02/14/2018   S/P AKA (above knee amputation) bilateral ([place]) 02/12/2018   Current smoker 02/03/2018   Hyperglycemia due to diabetes mellitus ([place]) 02/03/2018   Leukocytosis 02/03/2018   Thrombocytosis 02/03/2018   Urinary incontinence 08/14/2017   Transaminitis    Acute respiratory failure with hypoxia (HCC)    Elevated troponin 07/04/2016   Hyperuricemia 06/21/2016   Essential hypertension 01/05/2016   Diabetes type 2, uncontrolled 01/05/2016   Acquired hypothyroidism 05/10/2011   PCP:  [name], DO Pharmacy:   [facility] [address] [place] [address] Phone: [phone] Fax: [phone]  [id number] - [place], [place] [address] Phone: [phone] Fax: [place], [place] - [address] [place] [address] Phone: [phone] Fax: [phone]     Social Determinants of Health ([place]) Interventions    Readmission Risk Interventions    05/15/2022   10:57 AM 05/11/2022   11:34 AM  Readmission Risk Prevention Plan  Transportation Screening Complete Complete  Medication Review ([place]) Complete Complete  PCP or Specialist appointment within 3-5 days of discharge Complete Complete  HRI or Home Care Consult Complete Complete  SW Recovery Care/Counseling Consult Complete Complete  Palliative Care Screening Not Applicable Not Applicable  Skilled Nursing Facility Complete Complete

## 2022-05-15 NOTE — ED Notes (Signed)
Pt given breakfast tray

## 2022-05-15 NOTE — H&P (Signed)
History and Physical    Patient: Todd [name] MVH:[id number] DOB: [birth year] DOA: 05/14/2022 DOS: the patient was seen and examined on 05/15/2022 PCP: [name], DO  Patient coming from: Home  Chief Complaint:  Chief Complaint  Patient presents with   Emesis   Back Pain   HPI: Todd [name] is a 66 y.o. male with medical history significant of essential hypertension, hypothyroidism, type 2 diabetes mellitus, HFrEF, CKD 3B, chronic atrial fibrillation, PAD status post bilateral AKA, who presents to the emergency department due to several weeks to months of back pain. Back pain was intermittent and was sharp in nature, he denies any known injuries or fall.  Patient denies fever, nausea, vomiting, diarrhea.  However, per PA's medical record, patient's history was confirmed at the nursing facility, and it was said that patient has had several days of nausea, vomiting and diarrhea.He was just started on steroid (methylprednisolone) and had 1 dose yesterday.  It was unknown why patient was started on the steroid per the nursing staff.  ED Course:  In the emergency department, patient was hemodynamically stable. Workup in the ED showed: CBC: WBC 49.8 with a left shift, hemoglobin 11.9, hematocrit 36.0, MCV 84.1, platelets 742 BMP: Sodium 141, potassium 4.0, chloride 103, bicarb 13, glucose 137, BUN 48, creatinine 3.73 (baseline creatinine at 2.1-2.3) Albumin 2.9, ALP 198, lipase 29, urinalysis was normal.  Blood culture pending CT abdomen and pelvis without contrast showed: 1. Moderate severity right lower lobe atelectasis and/or infiltrate. 2. Colonic diverticulosis. 3. Acute versus subacute compression fracture deformity at the level of T12. MRI correlation is recommended. 4. Stable degenerative changes at the levels of L4-L5 and L5-S1. 5. Aortic atherosclerosis  Review of Systems: Review of systems as noted in the HPI. All other systems reviewed and are negative.   Past  Medical History:  Diagnosis Date   AKI (acute kidney injury) ([place])    Alcohol abuse    Cocaine abuse ([place]) 01/25/2021   Constipated    Diabetes mellitus without complication (HCC)    Diarrhea    Elevated LFTs    Goiter    Gout    Hyperlipidemia    Hypertension    Leukocytosis    Reactive thrombocytosis    Right BKA infection ([place]) 08/2016   Right leg pain    Sepsis due to undetermined organism [facility])    Thyroid disease    Wound infection after surgery 08/2016   Past Surgical History:  Procedure Laterality Date   ABDOMINAL AORTOGRAM N/A 08/11/2016   Procedure: Abdominal Aortogram;  Surgeon: Waynetta Sandy, MD;  Location: [place] [facility];  Service: Cardiovascular;  Laterality: N/A;   ABDOMINAL AORTOGRAM W/LOWER EXTREMITY N/A 08/15/2016   Procedure: Abdominal Aortogram w/Lower Extremity;  Surgeon: Elam Dutch, MD;  Location: [place] [facility];  Service: Cardiovascular;  Laterality: N/A;   AMPUTATION Right 08/17/2016   Procedure: RIGHT BELOW KNEE AMPUTATION;  Surgeon: Elam Dutch, MD;  Location: [place];  Service: Vascular;  Laterality: Right;   AMPUTATION Right 09/12/2016   Procedure: AMPUTATION ABOVE KNEE;  Surgeon: Newt Minion, MD;  Location: [place];  Service: Orthopedics;  Laterality: Right;   AMPUTATION Left 08/12/2016   Procedure: LEFT BELOW KNEE AMPUTATION;  Surgeon: Newt Minion, MD;  Location: [place];  Service: Orthopedics;  Laterality: Left;   AMPUTATION Left 11/01/2017   Procedure: LEFT ABOVE KNEE AMPUTATION;  Surgeon: Newt Minion, MD;  Location: [place];  Service: Orthopedics;  Laterality: Left;   APPLICATION  OF WOUND VAC Right 09/12/2016   Procedure: APPLICATION OF WOUND VAC ABOVE KNEE;  Surgeon: Newt Minion, MD;  Location: [place];  Service: Orthopedics;  Laterality: Right;   BIOPSY  01/13/2021   Procedure: BIOPSY;  Surgeon: Rogene Houston, MD;  [facility];  Service: Endoscopy;;  esophageal   COLONOSCOPY WITH PROPOFOL N/A 01/13/2021   Procedure:  COLONOSCOPY WITH PROPOFOL;  Surgeon: Rogene Houston, MD;  [facility];  Service: Endoscopy;  Laterality: N/A;   CORONARY STENT INTERVENTION N/A 05/09/2022   Procedure: CORONARY STENT INTERVENTION;  Surgeon: Nelva Bush, MD;  Location: [place] [facility];  Service: Cardiovascular;  Laterality: N/A;   ESOPHAGOGASTRODUODENOSCOPY (EGD) WITH PROPOFOL N/A 01/13/2021   Procedure: ESOPHAGOGASTRODUODENOSCOPY (EGD) WITH PROPOFOL;  Surgeon: Rogene Houston, MD;  [facility];  Service: Endoscopy;  Laterality: N/A;   INTRAVASCULAR PRESSURE WIRE/FFR STUDY N/A 05/09/2022   Procedure: INTRAVASCULAR PRESSURE WIRE/FFR STUDY;  Surgeon: Nelva Bush, MD;  Location: [place] [facility];  Service: Cardiovascular;  Laterality: N/A;   LEFT HEART CATH AND CORONARY ANGIOGRAPHY N/A 05/09/2022   Procedure: LEFT HEART CATH AND CORONARY ANGIOGRAPHY;  Surgeon: Nelva Bush, MD;  Location: [place] [facility];  Service: Cardiovascular;  Laterality: N/A;   LOWER EXTREMITY ANGIOGRAPHY Bilateral 08/11/2016   Procedure: Lower Extremity Angiography;  Surgeon: Waynetta Sandy, MD;  Location: [place] [facility];  Service: Cardiovascular;  Laterality: Bilateral;   PERIPHERAL VASCULAR BALLOON ANGIOPLASTY Left 08/11/2016   Procedure: Peripheral Vascular Balloon Angioplasty;  Surgeon: Waynetta Sandy, MD;  Location: [place] [facility];  Service: Cardiovascular;  Laterality: Left;  SFA   POLYPECTOMY  01/13/2021   Procedure: POLYPECTOMY;  Surgeon: Rogene Houston, MD;  [facility];  Service: Endoscopy;;   THYROID SURGERY      Social History:  reports that he has been smoking cigarettes. He has a 11.25 pack-year smoking history. He has never been exposed to tobacco smoke. He has never used smokeless tobacco. He reports that he does not currently use alcohol after a past usage of about 2.0 standard drinks of alcohol per week. He reports that he does not currently use drugs after  having used the following drugs: Cocaine.   Allergies  Allergen Reactions   Lisinopril Other (See Comments)    Hyperkalemia / Renal failure    Family History  Problem Relation Age of Onset   Heart disease Mother    Pneumonia Father    Diabetes Maternal Aunt    Diabetes Maternal Uncle      Prior to Admission medications   Medication Sig Start Date End Date Taking? Authorizing Provider  acetaminophen (TYLENOL) 325 MG tablet Take 2 tablets (650 mg total) by mouth every 6 (six) hours as needed for mild pain (or Fever >/= 101). Patient taking differently: Take 650 mg by mouth every 6 (six) hours as needed for moderate pain. 01/15/21   Johnson, Clanford L, MD  allopurinol (ZYLOPRIM) 100 MG tablet Take 1 tablet (100 mg total) by mouth daily. Patient taking differently: Take 100 mg by mouth in the morning. 12/06/20   Murlean Iba, MD  apixaban (ELIQUIS) 5 MG TABS tablet Take 1 tablet (5 mg total) by mouth 2 (two) times daily. 01/16/21   Johnson, Clanford L, MD  Ascorbic Acid 500 MG CAPS Take 500 mg by mouth 2 (two) times daily.    [provider]  atorvastatin (LIPITOR) 80 MG tablet Take 1 tablet (80 mg total) by  mouth daily. 05/12/22   Margie Billet, PA-C  calcium carbonate (TUMS - DOSED IN MG ELEMENTAL CALCIUM) 500 MG chewable tablet Chew 1,000 mg by mouth 3 (three) times daily before meals.    [provider]  Cholecalciferol (VITAMIN D3) 50 MCG (2000 UT) capsule Take 2,000 Units by mouth in the morning.    [provider]  clopidogrel (PLAVIX) 75 MG tablet Take 1 tablet (75 mg total) by mouth daily with breakfast. 05/12/22   Margie Billet, PA-C  cyanocobalamin (V-R VITAMIN B-12) 500 MCG tablet Take 1 tablet (500 mcg total) by mouth daily. Patient taking differently: Take 500 mcg by mouth in the morning. 02/09/22   Harriett Rush, PA-C  folic acid (FOLVITE) 1 MG tablet Take 1 mg by mouth in the morning.    [provider]  Glucagon,  rDNA, (GLUCAGON EMERGENCY) 1 MG KIT Inject 1 mg into the muscle as needed (hypoglycemia). 10/30/21   [provider]  hydrALAZINE (APRESOLINE) 50 MG tablet Take 1 tablet (50 mg total) by mouth every 8 (eight) hours. 05/11/22   Margie Billet, PA-C  insulin lispro (ADMELOG SOLOSTAR) 100 UNIT/ML KwikPen Inject 16 Units into the skin at bedtime.    [provider]  insulin lispro (HUMALOG) 100 UNIT/ML KwikPen Inject 3-9 Units into the skin 3 (three) times daily. Sliding scale 90-150-3 units, 151-200- 4units, 200-250 5units, 251-300 6units, 301-350 7units, 351-400 8units,401-450 9units Patient taking differently: Inject 0-6 Units into the skin See admin instructions. Inject 0-10 units 3 times daily before meals and at bedtime per sliding scale: CBG < 150 : 0 units CBG 151-200 : 1 units CBG 201-250 : 2 units CBG 251-300 : 3 units CBG 301-350 : 4 units CBG 351-400 : 5 units (over 400 give 10 units and call provider) CBG 401-500 : 6 units 01/11/22   Brita Romp, NP  isosorbide mononitrate (IMDUR) 60 MG 24 hr tablet Take 1 tablet (60 mg total) by mouth daily. 05/12/22   Margie Billet, PA-C  levothyroxine (SYNTHROID) 100 MCG tablet Take 100 mcg by mouth daily before breakfast. 03/29/22   [provider]  metoprolol succinate (TOPROL-XL) 100 MG 24 hr tablet Take 1 tablet (100 mg total) by mouth daily. Take with or immediately following a meal. Patient taking differently: Take 100 mg by mouth in the morning. 05/12/21   Patrecia Pour, MD  mexiletine (MEXITIL) 150 MG capsule Take 1 capsule (150 mg total) by mouth every 8 (eight) hours. 05/11/22   Margie Billet, PA-C  mirtazapine (REMERON) 7.5 MG tablet Take 7.5 mg by mouth at bedtime. 12/23/21   [provider]  Multiple Vitamin (MULTIVITAMIN WITH MINERALS) TABS tablet Take 1 tablet by mouth daily. Patient taking differently: Take 1 tablet by mouth in the morning. 01/16/21   Johnson, Clanford L, MD   Nutritional Supplements (NUTRITIONAL DRINK) LIQD Take 240 mLs by mouth daily at 12 noon. House Supplement    [provider]  pantoprazole (PROTONIX) 40 MG tablet Take 1 tablet (40 mg total) by mouth 2 (two) times daily. Patient taking differently: Take 40 mg by mouth in the morning. 01/15/21   Johnson, Clanford L, MD  polyethylene glycol (MIRALAX / GLYCOLAX) 17 g packet Take 17 g by mouth daily. Patient taking differently: Take 17 g by mouth in the morning. 01/16/21   Johnson, Clanford L, MD  sertraline (ZOLOFT) 50 MG tablet Take 1 tablet (50 mg total) by mouth at bedtime. Patient taking differently: Take  50 mg by mouth in the morning. 12/06/20   Irwin Brakeman L, MD  sodium bicarbonate 650 MG tablet Take 650 mg by mouth 3 (three) times daily before meals.    [provider]  thiamine 100 MG tablet Take 1 tablet (100 mg total) by mouth daily. Patient taking differently: Take 100 mg by mouth in the morning. 12/29/20   Roxan Hockey, MD    Physical Exam: BP (!) 167/82   Pulse 78   Temp 98.2 F (36.8 C) (Oral)   Resp 17   Ht 5' (1.524 m)   Wt 68 kg   SpO2 99%   BMI 29.28 kg/m   General: 66 y.o. year-old male well developed well nourished in no acute distress.  Alert and oriented x3. HEENT: NCAT, EOMI, dry mucous membrane Neck: Supple, trachea medial Cardiovascular: Regular rate and rhythm with no rubs or gallops.  No thyromegaly or JVD noted.  No lower extremity edema. 2/4 pulses in all 4 extremities. Respiratory: Clear to auscultation with no wheezes or rales. Good inspiratory effort. Abdomen: Soft, nontender nondistended with normal bowel sounds x4 quadrants. Muskuloskeletal: Bilateral AKA.  No cyanosis, clubbing or edema noted bilaterally Neuro: CN II-XII intact, strength 5/5 x 4, sensation, reflexes intact Skin: No ulcerative lesions noted or rashes Psychiatry: Judgement and insight appear normal. Mood is appropriate for condition and setting          Labs  on Admission:  Basic Metabolic Panel: Recent Labs  Lab 05/09/22 0121 05/10/22 0307 05/11/22 0324 05/14/22 2201  NA 138 137 138 141  K 4.5 5.5* 5.0 5.0  CL 108 109 116* 117*  CO2 16* 18* 14* 13*  GLUCOSE 295* 110* 197* 137*  BUN 29* 27* 33* 48*  CREATININE 2.07* 2.28* 2.65* 3.73*  CALCIUM 7.7* 8.2* 7.7* 8.2*  MG  --  1.6* 2.6*  --    Liver Function Tests: Recent Labs  Lab 05/14/22 2201  AST 35  ALT 25  ALKPHOS 198*  BILITOT 0.8  PROT 8.5*  ALBUMIN 2.9*   Recent Labs  Lab 05/14/22 2201  LIPASE 29   No results for input(s): "AMMONIA" in the last 168 hours. CBC: Recent Labs  Lab 05/10/22 0307 05/14/22 2201  WBC 14.2* 49.8*  NEUTROABS  --  44.5*  HGB 11.1* 11.9*  HCT 33.1* 36.0*  MCV 83.4 84.1  PLT 575* 742*   Cardiac Enzymes: No results for input(s): "CKTOTAL", "CKMB", "CKMBINDEX", "TROPONINI" in the last 168 hours.  BNP (last 3 results) Recent Labs    05/27/21 0427  BNP 753.0*    ProBNP (last 3 results) No results for input(s): "PROBNP" in the last 8760 hours.  CBG: Recent Labs  Lab 05/10/22 1128 05/10/22 1633 05/10/22 2131 05/11/22 0813 05/11/22 1258  GLUCAP 166* 149* 157* 153* 145*    Radiological Exams on Admission: CT RENAL STONE STUDY  Addendum Date: 05/15/2022   ADDENDUM REPORT: 05/15/2022 00:26 ADDENDUM: It should be noted that chronic, mild to moderate severity nonspecific bilateral perinephric inflammatory fat stranding is seen. This is stable in appearance when compared to the visualized portions of the kidneys on the prior lumbar spine CT (dated March 29, 2022). Electronically Signed   By: Virgina Norfolk M.D.   On: 05/15/2022 00:26   Result Date: 05/15/2022 CLINICAL DATA:  Left flank pain. EXAM: CT ABDOMEN AND PELVIS WITHOUT CONTRAST TECHNIQUE: Multidetector CT imaging of the abdomen and pelvis was performed following the standard protocol without IV contrast. RADIATION DOSE REDUCTION: This exam was performed  according to the  departmental dose-optimization program which includes automated exposure control, adjustment of the mA and/or kV according to patient size and/or use of iterative reconstruction technique. COMPARISON:  None Available. FINDINGS: Lower chest: Moderate severity atelectasis and/or infiltrate is seen within the posterior aspect of the right lower lobe. Hepatobiliary: No focal liver abnormality is seen. No gallstones, gallbladder wall thickening, or biliary dilatation. Pancreas: Unremarkable. No pancreatic ductal dilatation or surrounding inflammatory changes. Spleen: Normal in size without focal abnormality. Adrenals/Urinary Tract: Adrenal glands are unremarkable. Kidneys are normal in size, without obstructing renal calculi or hydronephrosis. Multiple bilateral subcentimeter renal calcifications are seen, several of which appear to be vascular in origin. A 9 mm diameter cystic appearing area is seen within the mid left kidney. The urinary bladder is poorly distended and subsequently limited in evaluation. Mild to moderate severity diffuse urinary bladder wall thickening is noted. Stomach/Bowel: Stomach is within normal limits. Appendix appears normal. No evidence of bowel wall thickening, distention, or inflammatory changes. Noninflamed diverticula are seen throughout the descending and sigmoid colon. Vascular/Lymphatic: Aortic atherosclerosis. No enlarged abdominal or pelvic lymph nodes. Reproductive: Prostate is unremarkable. Other: No abdominal wall hernia or abnormality. No abdominopelvic ascites. Musculoskeletal: A compression fracture deformity is seen at the level of T12. This represents a new finding when compared to the prior lumbar spine CT, dated March 29, 2022. Stable degenerative changes are seen at the levels of L4-L5 and L5-S1. IMPRESSION: 1. Moderate severity right lower lobe atelectasis and/or infiltrate. 2. Colonic diverticulosis. 3. Acute versus subacute compression fracture deformity at the level  of T12. MRI correlation is recommended. 4. Stable degenerative changes at the levels of L4-L5 and L5-S1. 5. Aortic atherosclerosis. Aortic Atherosclerosis (ICD10-I70.0). Electronically Signed: By: Virgina Norfolk M.D. On: 05/14/2022 22:46    EKG: I independently viewed the EKG done and my findings are as followed: Normal sinus rhythm at a rate of 86 bpm with nonspecific repolarization abnormality  Assessment/Plan Present on Admission:  Acute kidney injury superimposed on chronic kidney disease ([place])  Thrombocytosis  Leukocytosis  Hypoalbuminemia due to protein-calorie malnutrition (HCC)  Chronic combined systolic and diastolic CHF (congestive heart failure) (HCC)  Essential hypertension  Atrial fibrillation, chronic (HCC)  Principal Problem:   Acute kidney injury superimposed on chronic kidney disease ([place]) Active Problems:   Essential hypertension   Leukocytosis   Thrombocytosis   Atrial fibrillation, chronic (HCC)   Chronic combined systolic and diastolic CHF (congestive heart failure) (HCC)   Hypoalbuminemia due to protein-calorie malnutrition (HCC)   Compression fracture of body of thoracic vertebra (HCC)  Acute kidney injury superimposed on CKD stage IV Creatinine 3.73 (baseline creatinine at 2.1-2.3) Continue gentle hydration Renally adjust medications, avoid nephrotoxic agents/dehydration/hypotension  Thrombocytosis possibly reactive Platelets 742, this has consistently stayed elevated for about a month Continue to monitor platelet levels  Leukocytosis possibly reactive vs infectious Right lower lobe with atelectasis/infiltrate, rule out pneumonia WBC 49.8 with a left shift.  No obvious sign of any acute infectious process at this time other than x-ray finding suggestive of possible pneumonia, but patient has no symptoms related to pneumonia Continue to monitor WBC with morning labs Procalcitonin will be checked with plan to start patient on antibiotics for pneumonia  based on findings  Compression fracture CT finding of acute/subacute compression fracture of T12 Consider MRI correlation as suggested by radiologist Continue fall precaution Continue Tylenol as needed   Metabolic acidosis due to unknown cause This may be multifactorial including acute kidney injury Continue gentle hydration Continue  to monitor bicarb for improvement  Hypoalbuminemia possibly secondary to moderate protein calorie malnutrition Albumin 2.9, protein supplement to be provided  Essential hypertension Continue hydralazine, Imdur, Toprol-XL  Type 2 diabetes mellitus Hemoglobin A1c on 05/24/2022 was 6.2 Continue Semglee 8 units and adjust dose accordingly Continue ISS and hypoglycemic protocol  Combined systolic and diastolic CHF Echocardiogram done on 05/04/2022 showed LVEF of 45 to 50%.  LV demonstrates global hypokinesis.  Mild concentric LVH.  G2 DD Continue Toprol XL Lipitor,  Chronic atrial fibrillation Continue Eliquis, Toprol-XL  PAD status post bilateral AKA Continue Plavix, Imdur, Lipitor, Toprol-XL   DVT prophylaxis: Eliquis  Code Status: Full code  Family Communication: None at bedside  Consults: None  Severity of Illness: The appropriate patient status for this patient is INPATIENT. Inpatient status is judged to be reasonable and necessary in order to provide the required intensity of service to ensure the patient's safety. The patient's presenting symptoms, physical exam findings, and initial radiographic and laboratory data in the context of their chronic comorbidities is felt to place them at high risk for further clinical deterioration. Furthermore, it is not anticipated that the patient will be medically stable for discharge from the hospital within 2 midnights of admission.   * I certify that at the point of admission it is my clinical judgment that the patient will require inpatient hospital care spanning beyond 2 midnights from the point of  admission due to high intensity of service, high risk for further deterioration and high frequency of surveillance required.*  Author: Bernadette Hoit, DO 05/15/2022 7:47 AM  For on call review [email or web address].

## 2022-05-15 NOTE — ED Notes (Signed)
Lab at bedside

## 2022-05-15 NOTE — Progress Notes (Signed)
Todd [name] is a 66 y.o. male with medical history significant of essential hypertension, hypothyroidism, type 2 diabetes mellitus, HFrEF, CKD 3B, chronic atrial fibrillation, PAD status post bilateral AKA, who presents to the emergency department due to several weeks to months of back pain.  He was admitted with AKI on CKD stage IV with poor appetite and intake recently as well as worsening leukocytosis reflective of possible aspiration pneumonia given recent nausea and vomiting.  He is also noted to have a compression fracture to the T12 region.  He was admitted after midnight and seen and evaluated at bedside.  Procalcitonin is elevated along with white blood cell count and therefore he will be started on Zosyn for aspiration coverage and will remain on IV fluid for treatment of AKI.  Continue pain management for compression fracture.  Monitor a.m. labs.  Total care time: 30 minutes.

## 2022-05-15 NOTE — ED Notes (Signed)
CBG 149. 

## 2022-05-15 NOTE — ED Notes (Signed)
Pt only ate 1 bite of his sausage for breakfast. Pt reported he just doesn't have an appetite.

## 2022-05-15 NOTE — Progress Notes (Signed)
Pharmacy Antibiotic Note  Todd [name] is a 66 y.o. male admitted on 05/14/2022 with Asp pneumonia. Pharmacy has been consulted for zosyn dosing. Patient in acute on chronic renal failure, baseline Scr ~2.1-2.3  Plan: Zosyn 2.25gm IV q 6h Monitor renal function and cultures  Height: 5' (152.4 cm) Weight: 68 kg (149 lb 14.6 oz) IBW/kg (Calculated) : 50  Temp (24hrs), Avg:98.1 F (36.7 C), Min:98 F (36.7 C), Max:98.2 F (36.8 C)  Recent Labs  Lab 05/09/22 0121 05/10/22 0307 05/11/22 0324 05/14/22 2201  WBC  --  14.2*  --  49.8*  CREATININE 2.07* 2.28* 2.65* 3.73*    Estimated Creatinine Clearance: 15.5 mL/min (A) (by C-G formula based on SCr of 3.73 mg/dL (H)).    Allergies  Allergen Reactions   Lisinopril Other (See Comments)    Hyperkalemia / Renal failure    Microbiology results: 12/3 BCx: P   Thank you for allowing pharmacy to be a part of this patient's care.  Lorenso Courier [facility] 05/15/2022 10:43 AM

## 2022-05-15 NOTE — ED Notes (Signed)
Pt given ginger ale to drink. 

## 2022-05-16 ENCOUNTER — Inpatient Hospital Stay (HOSPITAL_COMMUNITY): Payer: Medicare (Managed Care)

## 2022-05-16 DIAGNOSIS — N189 Chronic kidney disease, unspecified: Secondary | ICD-10-CM | POA: Diagnosis not present

## 2022-05-16 DIAGNOSIS — N179 Acute kidney failure, unspecified: Secondary | ICD-10-CM | POA: Diagnosis not present

## 2022-05-16 LAB — FERRITIN: Ferritin: 649 ng/mL — ABNORMAL HIGH (ref 24–336)

## 2022-05-16 LAB — IRON AND TIBC
Iron: 28 ug/dL — ABNORMAL LOW (ref 45–182)
Saturation Ratios: 12 % — ABNORMAL LOW (ref 17.9–39.5)
TIBC: 231 ug/dL — ABNORMAL LOW (ref 250–450)
UIBC: 203 ug/dL

## 2022-05-16 LAB — COMPREHENSIVE METABOLIC PANEL
ALT: 21 U/L (ref 0–44)
AST: 35 U/L (ref 15–41)
Albumin: 2.4 g/dL — ABNORMAL LOW (ref 3.5–5.0)
Alkaline Phosphatase: 164 U/L — ABNORMAL HIGH (ref 38–126)
Anion gap: 8 (ref 5–15)
BUN: 36 mg/dL — ABNORMAL HIGH (ref 8–23)
CO2: 14 mmol/L — ABNORMAL LOW (ref 22–32)
Calcium: 7.2 mg/dL — ABNORMAL LOW (ref 8.9–10.3)
Chloride: 120 mmol/L — ABNORMAL HIGH (ref 98–111)
Creatinine, Ser: 2.67 mg/dL — ABNORMAL HIGH (ref 0.61–1.24)
GFR, Estimated: 25 mL/min — ABNORMAL LOW (ref >60)
Glucose, Bld: 95 mg/dL (ref 70–99)
Potassium: 3.9 mmol/L (ref 3.5–5.1)
Sodium: 142 mmol/L (ref 135–145)
Total Bilirubin: 0.5 mg/dL (ref 0.3–1.2)
Total Protein: 7.1 g/dL (ref 6.5–8.1)

## 2022-05-16 LAB — CBC
HCT: 31 % — ABNORMAL LOW (ref 39.0–52.0)
Hemoglobin: 9.9 g/dL — ABNORMAL LOW (ref 13.0–17.0)
MCH: 27.3 pg (ref 26.0–34.0)
MCHC: 31.9 g/dL (ref 30.0–36.0)
MCV: 85.4 fL (ref 80.0–100.0)
Platelets: 587 10*3/uL — ABNORMAL HIGH (ref 150–400)
RBC: 3.63 MIL/uL — ABNORMAL LOW (ref 4.22–5.81)
RDW: 15.4 % (ref 11.5–15.5)
WBC: 20.8 10*3/uL — ABNORMAL HIGH (ref 4.0–10.5)
nRBC: 0 % (ref 0.0–0.2)

## 2022-05-16 LAB — RETICULOCYTES
Immature Retic Fract: 15.9 % (ref 2.3–15.9)
RBC.: 3.68 MIL/uL — ABNORMAL LOW (ref 4.22–5.81)
Retic Count, Absolute: 39.7 10*3/uL (ref 19.0–186.0)
Retic Ct Pct: 1.1 % (ref 0.4–3.1)

## 2022-05-16 LAB — VITAMIN B12: Vitamin B-12: 584 pg/mL (ref 180–914)

## 2022-05-16 LAB — GLUCOSE, CAPILLARY
Glucose-Capillary: 100 mg/dL — ABNORMAL HIGH (ref 70–99)
Glucose-Capillary: 85 mg/dL (ref 70–99)
Glucose-Capillary: 137 mg/dL — ABNORMAL HIGH (ref 70–99)
Glucose-Capillary: 95 mg/dL (ref 70–99)
Glucose-Capillary: 99 mg/dL (ref 70–99)

## 2022-05-16 LAB — MAGNESIUM: Magnesium: 1.8 mg/dL (ref 1.7–2.4)

## 2022-05-16 LAB — FOLATE: Folate: 26.1 ng/mL (ref >5.9)

## 2022-05-16 MED ORDER — MUPIROCIN 2 % EX OINT
1.0000 | TOPICAL_OINTMENT | Freq: Two times a day (BID) | CUTANEOUS | Status: DC
  Administered 2022-05-16 (×3): 1 via NASAL
  Filled 2022-05-16 (×2): qty 22

## 2022-05-16 MED ORDER — PIPERACILLIN-TAZOBACTAM 3.375 G IVPB
3.3750 g | Freq: Three times a day (TID) | INTRAVENOUS | Status: DC
  Administered 2022-05-16 – 2022-05-17 (×3): 3.375 g via INTRAVENOUS
  Filled 2022-05-16 (×3): qty 50

## 2022-05-16 MED ORDER — CHLORHEXIDINE GLUCONATE CLOTH 2 % EX PADS
6.0000 | MEDICATED_PAD | Freq: Every day | CUTANEOUS | Status: DC
  Administered 2022-05-16 – 2022-05-17 (×2): 6 via TOPICAL

## 2022-05-16 MED ORDER — SODIUM BICARBONATE 650 MG PO TABS
650.0000 mg | ORAL_TABLET | Freq: Three times a day (TID) | ORAL | Status: DC
  Administered 2022-05-16 – 2022-05-17 (×4): 650 mg via ORAL
  Filled 2022-05-16 (×4): qty 1

## 2022-05-16 NOTE — Progress Notes (Signed)
Orthopedic Tech Progress Note Patient Details:  Todd [name] June 04, 1956 [id number] Called in order to [facility] for TLSO Patient ID: Todd [name], male   DOB: Nov 20, 1955, 66 y.o.   MRN: [id number]  Todd [name] 05/16/2022, 11:56 AM

## 2022-05-16 NOTE — Progress Notes (Signed)
PROGRESS NOTE    Todd [name]  ATF:[id number] DOB: 1956-06-08 DOA: 05/14/2022 PCP: [name], DO   Brief Narrative:    Todd [name] is a 66 y.o. male with medical history significant of essential hypertension, hypothyroidism, type 2 diabetes mellitus, HFrEF, CKD 3B, chronic atrial fibrillation, PAD status post bilateral AKA, who presents to the emergency department due to several weeks to months of back pain.  He was admitted with AKI on CKD stage IV with poor appetite and intake recently as well as worsening leukocytosis reflective of possible aspiration pneumonia given recent nausea and vomiting.  He is also noted to have a compression fracture to the T12 region.   Assessment & Plan:   Principal Problem:   Acute kidney injury superimposed on chronic kidney disease ([place]) Active Problems:   Essential hypertension   Leukocytosis   Thrombocytosis   Atrial fibrillation, chronic (HCC)   Chronic combined systolic and diastolic CHF (congestive heart failure) (HCC)   Hypoalbuminemia due to protein-calorie malnutrition (HCC)   Compression fracture of body of thoracic vertebra (HCC)  Assessment and Plan:   Acute kidney injury superimposed on CKD stage IV-improving Creatinine 3.73 (baseline creatinine at 2.1-2.3) Continue to monitor and avoid further IV fluid Renally adjust medications, avoid nephrotoxic agents/dehydration/hypotension   Thrombocytosis possibly reactive Platelets 742, this has consistently stayed elevated for about a month Continue to monitor platelet levels  Anemia Likely related to CKD Check anemia panel   Leukocytosis likely due to aspiration pneumonia/pneumonitis-improving Procalcitonin noted to be elevated and patient started on Zosyn 12/3, continue same for now   Compression fracture CT finding of acute/subacute compression fracture of T12 Consider MRI correlation as suggested by radiologist today Appreciate PT evaluation for back  brace Continue fall precaution Continue Tylenol as needed    Metabolic acidosis due to unknown cause This may be multifactorial including acute kidney injury Continue gentle hydration Continue to monitor bicarb for improvement   Hypoalbuminemia possibly secondary to moderate protein calorie malnutrition Albumin 2.9, protein supplement to be provided   Essential hypertension Continue hydralazine, Imdur, Toprol-XL   Type 2 diabetes mellitus Hemoglobin A1c on 05/24/2022 was 6.2 Continue Semglee 8 units and adjust dose accordingly Continue ISS and hypoglycemic protocol   Combined systolic and diastolic CHF Echocardiogram done on 05/04/2022 showed LVEF of 45 to 50%.  LV demonstrates global hypokinesis.  Mild concentric LVH.  G2 DD Continue Toprol XL Lipitor,   Chronic atrial fibrillation Continue Eliquis, Toprol-XL   PAD status post bilateral AKA Continue Plavix, Imdur, Lipitor, Toprol-XL    DVT prophylaxis: Eliquis Code Status: Full Family Communication: Discussed with sister 12/3 Disposition Plan:  Status is: Inpatient Remains inpatient appropriate because: Need for IV medications.   Skin Assessment:  I have examined the patient's skin and I agree with the wound assessment as performed by the wound care RN as outlined below:  Pressure Injury 05/15/22 Buttocks Left;Posterior Stage 2 -  Partial thickness loss of dermis presenting as a shallow open injury with a red, pink wound bed without slough. total of 3 stage 2 wounds to left buttocks (Active)  05/15/22 1614  Location: Buttocks  Location Orientation: Left;Posterior  Staging: Stage 2 -  Partial thickness loss of dermis presenting as a shallow open injury with a red, pink wound bed without slough.  Wound Description (Comments): total of 3 stage 2 wounds to left buttocks  Present on Admission: Yes  Dressing Type Foam - Lift dressing to assess site every shift 05/15/22 1945    Consultants:  None  Procedures:   None  Antimicrobials:  Anti-infectives (From admission, onward)    Start     Dose/Rate Route Frequency Ordered Stop   05/16/22 1400  piperacillin-tazobactam (ZOSYN) IVPB 3.375 g        3.375 g 12.5 mL/hr over 240 Minutes Intravenous Every 8 hours 05/16/22 0921     05/15/22 1200  piperacillin-tazobactam (ZOSYN) IVPB 2.25 g  Status:  Discontinued        2.25 g 100 mL/hr over 30 Minutes Intravenous Every 6 hours 05/15/22 1028 05/15/22 1031   05/15/22 1130  piperacillin-tazobactam (ZOSYN) 2.25 g in sodium chloride 0.9 % 50 mL IVPB  Status:  Discontinued        2.25 g 100 mL/hr over 30 Minutes Intravenous Every 6 hours 05/15/22 1031 05/16/22 0921      Subjective: Patient seen and evaluated today with no new acute complaints or concerns. No acute concerns or events noted overnight.  He continues to have some ongoing back pain.  Objective: Vitals:   05/15/22 2319 05/16/22 0348 05/16/22 0649 05/16/22 0810  BP: (!) 150/74 (!) 174/72 (!) 176/79 (!) 164/75  Pulse: 76 77 76 76  Resp: '18 16 18 18  '$ Temp: 98.3 F (36.8 C) 98.4 F (36.9 C) 98.4 F (36.9 C)   TempSrc: Oral Oral Oral   SpO2: 99% 100% 100% 100%  Weight:      Height:        Intake/Output Summary (Last 24 hours) at 05/16/2022 0923 Last data filed at 05/16/2022 0825 Gross per 24 hour  Intake 530 ml  Output 400 ml  Net 130 ml   Filed Weights   05/14/22 2021  Weight: 68 kg    Examination:  General exam: Appears calm and comfortable  Respiratory system: Clear to auscultation. Respiratory effort normal. Cardiovascular system: S1 & S2 heard, RRR.  Gastrointestinal system: Abdomen is soft Central nervous system: Alert and awake Extremities: Bilateral AKA Skin: No significant lesions noted Psychiatry: Flat affect.    Data Reviewed: I have personally reviewed following labs and imaging studies  CBC: Recent Labs  Lab 05/10/22 0307 05/14/22 2201 05/16/22 0536  WBC 14.2* 49.8* 20.8*  NEUTROABS  --  44.5*  --    HGB 11.1* 11.9* 9.9*  HCT 33.1* 36.0* 31.0*  MCV 83.4 84.1 85.4  PLT 575* 742* 062*   Basic Metabolic Panel: Recent Labs  Lab 05/10/22 0307 05/11/22 0324 05/14/22 2201 05/15/22 0900 05/16/22 0536  NA 137 138 141  --  142  K 5.5* 5.0 5.0  --  3.9  CL 109 116* 117*  --  120*  CO2 18* 14* 13*  --  14*  GLUCOSE 110* 197* 137*  --  95  BUN 27* 33* 48*  --  36*  CREATININE 2.28* 2.65* 3.73*  --  2.67*  CALCIUM 8.2* 7.7* 8.2*  --  7.2*  MG 1.6* 2.6*  --  2.0 1.8  PHOS  --   --   --  4.4  --    GFR: Estimated Creatinine Clearance: 21.7 mL/min (A) (by C-G formula based on SCr of 2.67 mg/dL (H)). Liver Function Tests: Recent Labs  Lab 05/14/22 2201 05/16/22 0536  AST 35 35  ALT 25 21  ALKPHOS 198* 164*  BILITOT 0.8 0.5  PROT 8.5* 7.1  ALBUMIN 2.9* 2.4*   Recent Labs  Lab 05/14/22 2201  LIPASE 29   No results for input(s): "AMMONIA" in the last 168 hours. Coagulation Profile: No results for input(s): "INR", "  PROTIME" in the last 168 hours. Cardiac Enzymes: No results for input(s): "CKTOTAL", "CKMB", "CKMBINDEX", "TROPONINI" in the last 168 hours. BNP (last 3 results) No results for input(s): "PROBNP" in the last 8760 hours. HbA1C: No results for input(s): "HGBA1C" in the last 72 hours. CBG: Recent Labs  Lab 05/15/22 0817 05/15/22 1243 05/15/22 1836 05/15/22 1946 05/16/22 0720  GLUCAP 121* 138* 149* 113* 100*   Lipid Profile: No results for input(s): "CHOL", "HDL", "LDLCALC", "TRIG", "CHOLHDL", "LDLDIRECT" in the last 72 hours. Thyroid Function Tests: No results for input(s): "TSH", "T4TOTAL", "FREET4", "T3FREE", "THYROIDAB" in the last 72 hours. Anemia Panel: No results for input(s): "VITAMINB12", "FOLATE", "FERRITIN", "TIBC", "IRON", "RETICCTPCT" in the last 72 hours. Sepsis Labs: Recent Labs  Lab 05/15/22 0711  PROCALCITON 4.44    Recent Results (from the past 240 hour(s))  Blood culture (routine x 2)     Status: None (Preliminary result)    Collection Time: 05/15/22 12:50 AM   Specimen: BLOOD RIGHT HAND  Result Value Ref Range Status   Specimen Description   Final    BLOOD RIGHT HAND BOTTLES DRAWN AEROBIC AND ANAEROBIC   Special Requests Blood Culture adequate volume  Final   Culture   Final    NO GROWTH < 24 HOURS Performed at [facility], [address], [place], [place] [address]    Report Status PENDING  Incomplete  Blood culture (routine x 2)     Status: None (Preliminary result)   Collection Time: 05/15/22 12:55 AM   Specimen: BLOOD RIGHT HAND  Result Value Ref Range Status   Specimen Description   Final    BLOOD RIGHT HAND BOTTLES DRAWN AEROBIC AND ANAEROBIC   Special Requests Blood Culture adequate volume  Final   Culture   Final    NO GROWTH < 24 HOURS Performed at [facility], [address] [place], [place] [address]    Report Status PENDING  Incomplete         Radiology Studies: CT RENAL STONE STUDY  Addendum Date: 05/15/2022   ADDENDUM REPORT: 05/15/2022 00:26 ADDENDUM: It should be noted that chronic, mild to moderate severity nonspecific bilateral perinephric inflammatory fat stranding is seen. This is stable in appearance when compared to the visualized portions of the kidneys on the prior lumbar spine CT (dated March 29, 2022). Electronically Signed   By: Virgina Norfolk M.D.   On: 05/15/2022 00:26   Result Date: 05/15/2022 CLINICAL DATA:  Left flank pain. EXAM: CT ABDOMEN AND PELVIS WITHOUT CONTRAST TECHNIQUE: Multidetector CT imaging of the abdomen and pelvis was performed following the standard protocol without IV contrast. RADIATION DOSE REDUCTION: This exam was performed according to the departmental dose-optimization program which includes automated exposure control, adjustment of the mA and/or kV according to patient size and/or use of iterative reconstruction technique. COMPARISON:  None Available. FINDINGS: Lower chest: Moderate severity atelectasis and/or infiltrate is seen within the  posterior aspect of the right lower lobe. Hepatobiliary: No focal liver abnormality is seen. No gallstones, gallbladder wall thickening, or biliary dilatation. Pancreas: Unremarkable. No pancreatic ductal dilatation or surrounding inflammatory changes. Spleen: Normal in size without focal abnormality. Adrenals/Urinary Tract: Adrenal glands are unremarkable. Kidneys are normal in size, without obstructing renal calculi or hydronephrosis. Multiple bilateral subcentimeter renal calcifications are seen, several of which appear to be vascular in origin. A 9 mm diameter cystic appearing area is seen within the mid left kidney. The urinary bladder is poorly distended and subsequently limited in evaluation. Mild to moderate severity  diffuse urinary bladder wall thickening is noted. Stomach/Bowel: Stomach is within normal limits. Appendix appears normal. No evidence of bowel wall thickening, distention, or inflammatory changes. Noninflamed diverticula are seen throughout the descending and sigmoid colon. Vascular/Lymphatic: Aortic atherosclerosis. No enlarged abdominal or pelvic lymph nodes. Reproductive: Prostate is unremarkable. Other: No abdominal wall hernia or abnormality. No abdominopelvic ascites. Musculoskeletal: A compression fracture deformity is seen at the level of T12. This represents a new finding when compared to the prior lumbar spine CT, dated March 29, 2022. Stable degenerative changes are seen at the levels of L4-L5 and L5-S1. IMPRESSION: 1. Moderate severity right lower lobe atelectasis and/or infiltrate. 2. Colonic diverticulosis. 3. Acute versus subacute compression fracture deformity at the level of T12. MRI correlation is recommended. 4. Stable degenerative changes at the levels of L4-L5 and L5-S1. 5. Aortic atherosclerosis. Aortic Atherosclerosis (ICD10-I70.0). Electronically Signed: By: Virgina Norfolk M.D. On: 05/14/2022 22:46        Scheduled Meds:  apixaban  5 mg Oral BID    atorvastatin  80 mg Oral Daily   Chlorhexidine Gluconate Cloth  6 each Topical Q0600   clopidogrel  75 mg Oral Q breakfast   feeding supplement  237 mL Oral BID BM   hydrALAZINE  50 mg Oral Q8H   insulin aspart  0-5 Units Subcutaneous QHS   insulin aspart  0-9 Units Subcutaneous TID WC   insulin glargine-yfgn  8 Units Subcutaneous QHS   isosorbide mononitrate  60 mg Oral Daily   lidocaine  1 patch Transdermal Q24H   metoprolol succinate  100 mg Oral Daily   mupirocin ointment  1 Application Nasal BID   pantoprazole  40 mg Oral BID   sertraline  50 mg Oral QHS   sodium bicarbonate  650 mg Oral TID   Continuous Infusions:  piperacillin-tazobactam (ZOSYN)  IV       LOS: 1 day    Time spent: 35 minutes    Timouthy Gilardi Darleen Crocker, DO [facility]  If 7PM-7AM, please contact night-coverage [email or web address] 05/16/2022, 9:23 AM

## 2022-05-16 NOTE — TOC Progression Note (Signed)
Transition of Care [facility]) - Progression Note    Patient Details  Name: Todd [name] MRN: [id number] Date of Birth: 1956-05-06  Transition of Care [facility]) CM/SW Contact  Shade Flood, LCSW Phone Number: 05/16/2022, 11:49 AM  Clinical Narrative:     TOC following. Per MD, pt in need of TLSO brace. TOC called OT tech who states she will call [employer] rep and they should be here later today to apply the brace.  MD anticipating dc tomorrow.  Will follow.  Expected Discharge Plan: Long Term Nursing Home Barriers to Discharge: Continued Medical Work up  Expected Discharge Plan and Services Expected Discharge Plan: [place] In-house Referral: Clinical Social Work Discharge Planning Services: CM Consult Post Acute Care Choice: Nursing Home Living arrangements for the past 2 months: [place]                                       Social Determinants of Health (SDOH) Interventions    Readmission Risk Interventions    05/15/2022   10:57 AM 05/11/2022   11:34 AM  Readmission Risk Prevention Plan  Transportation Screening Complete Complete  Medication Review [employer]) Complete Complete  PCP or Specialist appointment within 3-5 days of discharge Complete Complete  HRI or Home Care Consult Complete Complete  SW Recovery Care/Counseling Consult Complete Complete  Palliative Care Screening Not Applicable Not Applicable  Skilled Nursing Facility Complete Complete

## 2022-05-17 ENCOUNTER — Ambulatory Visit: Payer: Medicare (Managed Care) | Admitting: Cardiology

## 2022-05-17 ENCOUNTER — Ambulatory Visit: Payer: Medicare (Managed Care) | Admitting: Nurse Practitioner

## 2022-05-17 DIAGNOSIS — N189 Chronic kidney disease, unspecified: Secondary | ICD-10-CM | POA: Diagnosis not present

## 2022-05-17 DIAGNOSIS — N179 Acute kidney failure, unspecified: Secondary | ICD-10-CM | POA: Diagnosis not present

## 2022-05-17 LAB — CBC
HCT: 31.7 % — ABNORMAL LOW (ref 39.0–52.0)
Hemoglobin: 10.5 g/dL — ABNORMAL LOW (ref 13.0–17.0)
MCH: 27.9 pg (ref 26.0–34.0)
MCHC: 33.1 g/dL (ref 30.0–36.0)
MCV: 84.1 fL (ref 80.0–100.0)
Platelets: 585 10*3/uL — ABNORMAL HIGH (ref 150–400)
RBC: 3.77 MIL/uL — ABNORMAL LOW (ref 4.22–5.81)
RDW: 15.2 % (ref 11.5–15.5)
WBC: 16.8 10*3/uL — ABNORMAL HIGH (ref 4.0–10.5)
nRBC: 0 % (ref 0.0–0.2)

## 2022-05-17 LAB — BASIC METABOLIC PANEL
Anion gap: 10 (ref 5–15)
BUN: 32 mg/dL — ABNORMAL HIGH (ref 8–23)
CO2: 14 mmol/L — ABNORMAL LOW (ref 22–32)
Calcium: 7.5 mg/dL — ABNORMAL LOW (ref 8.9–10.3)
Chloride: 118 mmol/L — ABNORMAL HIGH (ref 98–111)
Creatinine, Ser: 2.6 mg/dL — ABNORMAL HIGH (ref 0.61–1.24)
GFR, Estimated: 26 mL/min — ABNORMAL LOW (ref >60)
Glucose, Bld: 73 mg/dL (ref 70–99)
Potassium: 3.9 mmol/L (ref 3.5–5.1)
Sodium: 142 mmol/L (ref 135–145)

## 2022-05-17 LAB — GLUCOSE, CAPILLARY: Glucose-Capillary: 79 mg/dL (ref 70–99)

## 2022-05-17 LAB — MAGNESIUM: Magnesium: 1.8 mg/dL (ref 1.7–2.4)

## 2022-05-17 MED ORDER — LIDOCAINE 5 % EX PTCH: 1.0000 | MEDICATED_PATCH | CUTANEOUS | 0 refills | Status: DC

## 2022-05-17 MED ORDER — AMOXICILLIN-POT CLAVULANATE 500-125 MG PO TABS: 1.0000 | ORAL_TABLET | Freq: Two times a day (BID) | ORAL | 0 refills | Status: AC

## 2022-05-17 MED ORDER — OXYCODONE HCL 5 MG PO TABS: 5.0000 mg | ORAL_TABLET | Freq: Four times a day (QID) | ORAL | 0 refills | Status: AC | PRN

## 2022-05-17 NOTE — Progress Notes (Signed)
Report given to [place].

## 2022-05-17 NOTE — Progress Notes (Deleted)
Cardiology Office Note:    Date:  05/17/2022   ID:  Todd [name], DOB March 23, 1956, MRN [id number]  PCP:  [name], [place] [facility] Cardiologist:  Minus Breeding, MD Electrophysiologist:  [name], MD { Click to update primary MD,subspecialty MD or APP then REFRESH:1}    Referring MD: [name], *   No chief complaint on file. ***  History of Present Illness:    Todd [name] is a 65 y.o. male with a hx of ***  PAF CHF HTN PAD, s/p bilateral AKA Dyslipidemia T2DM Hx of polysubstance abuse  Patient is a 66 year old male with past medical history as mentioned above.  Was admitted in November 2022 with acute hypoxic respiratory failure due to CHF and pneumonia.  Hospital course was complicated by sustained VT and acute kidney injury, and cardiology was consulted.  Was placed on amiodarone 400 mg daily and cardiac cath was not done due to history of CKD.  He has been following up with EP for management of A-fib.  Was treated for acute systolic CHF.  Echocardiogram at that time revealed EF 45 to 50%, and GDMT included Imdur, hydralazine, and beta-blocker.  Was told to follow-up with nephrology as outpatient.  Last seen by Dr. [name] on August 30, 2021.  Was overall doing well from a cardiac perspective and denied any new cardiac complaints or concerns.  Denied any chest pain, palpitations, or any new shortness of breath.  Because of prolonged QT interval, amiodarone was stopped.   Presented to [facility] ED on May 03, 2022 with chief complaint of shaking episode at nursing facility, was [facility] prior to arrival as well as having N&V, and compression fracture of T12 region, was found to have leukocytosis due to possible aspiration PNA. Was in wide complex tachycardia on arrival and consented to cardioversion, was given a dose of etomidate and underwent successful cardioversion.  Labs did not indicate electrolyte abnormalities.   Cardiology was consulted because of this.  It was stated that in the past he was not a good ICD candidate given his history of substance abuse.  Mexiletine was added to medication regimen and left heart cath was recommended for ischemic evaluation.  An outpatient PET scan recommended by Dr. [name]. Was admitted with AKI on CKD stage IV, also had poor appetite with worsening leukocytosis due to possible aspiration pneumonia.  Echo revealed EF 45 to 50%, global hypokinesis of left ventricle, mild LVH, grade 2 diastolic dysfunction dilation along atria, no significant change from prior study.  Underwent left heart cath on May 09, 2022 that revealed significant vessel CAD with 60 to 70% mid LAD stenosis that was hemodynamically significant, mild, nonobstructive disease also noted in the mid left circumflex and RCA with normal left ventricular filling pressure.  He had successful RFR guided PCI to mid LAD with drug-eluting stent placed, DAPT with aspirin and Plavix while off Eliquis recommended.  Anticipate restarting Eliquis 2.5 mg twice daily next day if there is no evidence of bleeding or vascular injury after catheterization and anticipate Eliquis with Plavix for up to 12 months after cardiac cath.  Aggressive secondary prevention of CAD and further workup of V. tach and possible sarcoidosis per primary team/APP.  After cardiac cath it was found that he had absent right radial pulse, vascular ultrasound obtained and revealed turbulent waveforms in proximal subclavian artery suggestive of more proximal obstruction, dampened waveforms in the mid to distal subclavian artery were consistent with proximal obstruction, radial artery  is patent with some degree of calcification and dampened waveforms as compared to the ulnar artery.  Today he presents for follow-up. He states ***    Outpt PET scan...    Past Medical History:  Diagnosis Date   AKI (acute kidney injury) ([place])    Alcohol abuse    Cocaine  abuse ([place]) 01/25/2021   Constipated    Diabetes mellitus without complication (HCC)    Diarrhea    Elevated LFTs    Goiter    Gout    Hyperlipidemia    Hypertension    Leukocytosis    Reactive thrombocytosis    Right BKA infection ([place]) 08/2016   Right leg pain    Sepsis due to undetermined organism [facility])    Thyroid disease    Wound infection after surgery 08/2016    Past Surgical History:  Procedure Laterality Date   ABDOMINAL AORTOGRAM N/A 08/11/2016   Procedure: Abdominal Aortogram;  Surgeon: Waynetta Sandy, MD;  Location: [place] [facility];  Service: Cardiovascular;  Laterality: N/A;   ABDOMINAL AORTOGRAM W/LOWER EXTREMITY N/A 08/15/2016   Procedure: Abdominal Aortogram w/Lower Extremity;  Surgeon: Elam Dutch, MD;  Location: [place] [facility];  Service: Cardiovascular;  Laterality: N/A;   AMPUTATION Right 08/17/2016   Procedure: RIGHT BELOW KNEE AMPUTATION;  Surgeon: Elam Dutch, MD;  Location: [place];  Service: Vascular;  Laterality: Right;   AMPUTATION Right 09/12/2016   Procedure: AMPUTATION ABOVE KNEE;  Surgeon: Newt Minion, MD;  Location: [place];  Service: Orthopedics;  Laterality: Right;   AMPUTATION Left 08/12/2016   Procedure: LEFT BELOW KNEE AMPUTATION;  Surgeon: Newt Minion, MD;  Location: [place];  Service: Orthopedics;  Laterality: Left;   AMPUTATION Left 11/01/2017   Procedure: LEFT ABOVE KNEE AMPUTATION;  Surgeon: Newt Minion, MD;  Location: [place];  Service: Orthopedics;  Laterality: Left;   APPLICATION OF WOUND VAC Right 09/12/2016   Procedure: APPLICATION OF WOUND VAC ABOVE KNEE;  Surgeon: Newt Minion, MD;  Location: [place];  Service: Orthopedics;  Laterality: Right;   BIOPSY  01/13/2021   Procedure: BIOPSY;  Surgeon: Rogene Houston, MD;  [facility];  Service: Endoscopy;;  esophageal   COLONOSCOPY WITH PROPOFOL N/A 01/13/2021   Procedure: COLONOSCOPY WITH PROPOFOL;  Surgeon: Rogene Houston, MD;  [facility];  Service:  Endoscopy;  Laterality: N/A;   CORONARY STENT INTERVENTION N/A 05/09/2022   Procedure: CORONARY STENT INTERVENTION;  Surgeon: Nelva Bush, MD;  Location: [place] [facility];  Service: Cardiovascular;  Laterality: N/A;   ESOPHAGOGASTRODUODENOSCOPY (EGD) WITH PROPOFOL N/A 01/13/2021   Procedure: ESOPHAGOGASTRODUODENOSCOPY (EGD) WITH PROPOFOL;  Surgeon: Rogene Houston, MD;  [facility];  Service: Endoscopy;  Laterality: N/A;   INTRAVASCULAR PRESSURE WIRE/FFR STUDY N/A 05/09/2022   Procedure: INTRAVASCULAR PRESSURE WIRE/FFR STUDY;  Surgeon: Nelva Bush, MD;  Location: [place] [facility];  Service: Cardiovascular;  Laterality: N/A;   LEFT HEART CATH AND CORONARY ANGIOGRAPHY N/A 05/09/2022   Procedure: LEFT HEART CATH AND CORONARY ANGIOGRAPHY;  Surgeon: Nelva Bush, MD;  Location: [place] [facility];  Service: Cardiovascular;  Laterality: N/A;   LOWER EXTREMITY ANGIOGRAPHY Bilateral 08/11/2016   Procedure: Lower Extremity Angiography;  Surgeon: Waynetta Sandy, MD;  Location: [place] [facility];  Service: Cardiovascular;  Laterality: Bilateral;   PERIPHERAL VASCULAR BALLOON ANGIOPLASTY Left 08/11/2016   Procedure: Peripheral Vascular Balloon Angioplasty;  Surgeon: Waynetta Sandy, MD;  Location: [place] [facility];  Service: Cardiovascular;  Laterality: Left;  SFA   POLYPECTOMY  01/13/2021   Procedure: POLYPECTOMY;  Surgeon: Rogene Houston, MD;  [facility];  Service: Endoscopy;;   THYROID SURGERY      Current Medications: No outpatient medications have been marked as taking for the 05/17/22 encounter (Appointment) with Finis Bud, NP.     Allergies:   Lisinopril   Social History   Socioeconomic History   Marital status: Single    Spouse name: Not on file   Number of children: Not on file   Years of education: Not on file   Highest education level: Not on file  Occupational History   Occupation: retired    Comment: drove a Medical illustrator   Tobacco Use   Smoking status: Every Day    Packs/day: 0.25    Years: 45.00    Total pack years: 11.25    Types: Cigarettes    Last attempt to quit: 11/12/2014    Years since quitting: 7.5    Passive exposure: Never   Smokeless tobacco: Never  Vaping Use   Vaping Use: Never used  Substance and Sexual Activity   Alcohol use: Not Currently    Alcohol/week: 2.0 standard drinks of alcohol    Types: 1 Glasses of wine, 1 Cans of beer per week   Drug use: Not Currently    Types: Cocaine   Sexual activity: Not Currently  Other Topics Concern   Not on file  Social History Narrative   09/23/20 - Lives alone, uses a wheelchair, double amputee, not married, no children - HHA comes in 3x per week to help him bathe and set up weekly meds.   Social Determinants of Health   Financial Resource Strain: Low Risk  (09/23/2020)   Overall Financial Resource Strain (CARDIA)    Difficulty of Paying Living Expenses: Not very hard  Food Insecurity: No Food Insecurity (05/16/2022)   Hunger Vital Sign    Worried About Running Out of Food in the Last Year: Never true    Ran Out of Food in the Last Year: Never true  Transportation Needs: No Transportation Needs (05/04/2022)   PRAPARE - Hydrologist (Medical): No    Lack of Transportation (Non-Medical): No  Physical Activity: Inactive (09/23/2020)   Exercise Vital Sign    Days of Exercise per Week: 0 days    Minutes of Exercise per Session: 0 min  Stress: No Stress Concern Present (09/23/2020)   [place]    Feeling of Stress : Only a little  Social Connections: Socially Isolated (09/23/2020)   Social Connection and Isolation Panel [NHANES]    Frequency of Communication with Friends and Family: More than three times a week    Frequency of Social Gatherings with Friends and Family: More than three times a week    Attends Religious Services: Never    Museum/gallery conservator or Organizations: No    Attends Music therapist: Never    Marital Status: Never married     Family History: The patient's ***family history includes Diabetes in his maternal aunt and maternal uncle; Heart disease in his mother; Pneumonia in his father.  ROS:   Please see the history of present illness.    *** All other systems reviewed and are negative.  EKGs/Labs/Other Studies Reviewed:    The following studies were reviewed today: ***  EKG:  EKG is *** ordered today.  The ekg ordered today demonstrates ***  Recent Labs: 05/27/2021: B Natriuretic Peptide 753.0 05/03/2022: TSH 4.318 05/16/2022: ALT 21; BUN 36; Creatinine, Ser 2.67; Hemoglobin 9.9; Magnesium 1.8; Platelets 587; Potassium 3.9; Sodium 142  Recent Lipid Panel    Component Value Date/Time   CHOL 47 05/06/2022 0416   CHOL 59 (L) 09/22/2020 0923   TRIG 65 05/06/2022 0416   HDL 22 (L) 05/06/2022 0416   HDL 25 (L) 09/22/2020 0923   CHOLHDL 2.1 05/06/2022 0416   VLDL 13 05/06/2022 0416   LDLCALC 12 05/06/2022 0416   LDLCALC 19 09/22/2020 0923     Risk Assessment/Calculations:   {Does this patient have ATRIAL FIBRILLATION?:[phone]}            Physical Exam:    VS:  There were no vitals taken for this visit.    Wt Readings from Last 3 Encounters:  05/14/22 149 lb 14.6 oz (68 kg)  05/09/22 149 lb 0.5 oz (67.6 kg)  03/29/22 97 lb (44 kg)     GEN: *** Well nourished, well developed in no acute distress HEENT: Normal NECK: No JVD; No carotid bruits LYMPHATICS: No lymphadenopathy CARDIAC: ***RRR, no murmurs, rubs, gallops RESPIRATORY:  Clear to auscultation without rales, wheezing or rhonchi  ABDOMEN: Soft, non-tender, non-distended MUSCULOSKELETAL:  No edema; No deformity  SKIN: Warm and dry NEUROLOGIC:  Alert and oriented x 3 PSYCHIATRIC:  Normal affect   ASSESSMENT:    No diagnosis found. PLAN:    In order of problems listed above:  ***  {The patient has an active  order for outpatient cardiac rehabilitation.   Please indicate if the patient is ready to start. Do NOT delete this.  It will auto delete.  Refresh note, then sign.              Click here to document readiness and see contraindications.  :1}  Cardiac Rehabilitation Eligibility Assessment       {Are you ordering a CV Procedure (e.g. stress test, cath, DCCV, TEE, etc)?   Press F2        :[id number]}    Medication Adjustments/Labs and Tests Ordered: Current medicines are reviewed at length with the patient today.  Concerns regarding medicines are outlined above.  No orders of the defined types were placed in this encounter.  No orders of the defined types were placed in this encounter.   There are no Patient Instructions on file for this visit.   Signed, Finis Bud, NP  05/17/2022 4:29 AM    [place]

## 2022-05-17 NOTE — Care Management Important Message (Signed)
Important Message  Patient Details  Name: Todd [name] MRN: [id number] Date of Birth: 07/09/1955   Medicare Important Message Given:  Yes     [name] 05/17/2022, 10:38 AM

## 2022-05-17 NOTE — Evaluation (Signed)
Physical Therapy Evaluation Patient Details Name: Todd [name] MRN: [id number] DOB: 1956-03-05 Today's Date: 05/17/2022  History of Present Illness  Todd [name] is a 66 y.o. male with medical history significant of essential hypertension, hypothyroidism, type 2 diabetes mellitus, HFrEF, CKD 3B, chronic atrial fibrillation, PAD status post bilateral AKA, who presents to the emergency department due to several weeks to months of back pain. Back pain was intermittent and was sharp in nature, he denies any known injuries or fall.  Patient denies fever, nausea, vomiting, diarrhea.  However, per PA's medical record, patient's history was confirmed at the nursing facility, and it was said that patient has had several days of nausea, vomiting and diarrhea.He was just started on steroid (methylprednisolone) and had 1 dose yesterday.  It was unknown why patient was started on the steroid per the nursing staff.   Clinical Impression  Patient had to use bilateral side rails for pulling self to sitting with mod/max assist, required Max assist for donning TLSO brace, poor sitting balance with frequent leaning/falling to the right when not holding onto bed rails and required Max assist to reposition when put back to bed.  PLAN:  Patient to be discharged home today and discharged from acute physical therapy to care of nursing for out of bed using mechanical lift as tolerated for length of stay with recommendations stated below        Recommendations for follow up therapy are one component of a multi-disciplinary discharge planning process, led by the attending physician.  Recommendations may be updated based on patient status, additional functional criteria and insurance authorization.  Follow Up Recommendations Skilled nursing-short term rehab (<3 hours/day) Can patient physically be transported by private vehicle: No    Assistance Recommended at Discharge Set up Supervision/Assistance  Patient can  return home with the following  A lot of help with walking and/or transfers;A lot of help with bathing/dressing/bathroom;Help with stairs or ramp for entrance;Assistance with cooking/housework    Equipment Recommendations None recommended by PT  Recommendations for Other Services       Functional Status Assessment Patient has had a recent decline in their functional status and demonstrates the ability to make significant improvements in function in a reasonable and predictable amount of time.     Precautions / Restrictions Precautions Precautions: Fall Precaution Comments: B AKA Required Braces or Orthoses: Other Brace;Spinal Brace Spinal Brace: Thoracolumbosacral orthotic Other Brace: L wrist splint from home      Mobility  Bed Mobility Overal bed mobility: Needs Assistance Bed Mobility: Supine to Sit, Sit to Supine     Supine to sit: Mod assist, Max assist Sit to supine: Mod assist   General bed mobility comments: slow labored movement had to use bilateral side rails to pull self to long sitting position    Transfers                        Ambulation/Gait                  Stairs            Wheelchair Mobility    Modified Rankin (Stroke Patients Only)       Balance Overall balance assessment: Needs assistance Sitting-balance support: Feet unsupported, Bilateral upper extremity supported Sitting balance-Leahy Scale: Poor Sitting balance - Comments: long sitting in bed Postural control: Right lateral lean  Pertinent Vitals/Pain Pain Assessment Pain Assessment: Faces Faces Pain Scale: Hurts even more Pain Location: mid back with movement Pain Descriptors / Indicators: Grimacing, Guarding, Sharp Pain Intervention(s): Limited activity within patient's tolerance, Monitored during session, Repositioned, Other (comment) (applied TLSO brace)    Home Living Family/patient expects to be  discharged to:: Skilled nursing facility                        Prior Function Prior Level of Function : Needs assist       Physical Assist : Mobility (physical);ADLs (physical) Mobility (physical): Bed mobility;Transfers;Gait;Stairs   Mobility Comments: usually 1-2 person assisted transfers bed/wheelchair, uses wheelchair for mobility ADLs Comments: assisted by SNF staff     Hand Dominance   Dominant Hand: Right    Extremity/Trunk Assessment   Upper Extremity Assessment Upper Extremity Assessment: Generalized weakness    Lower Extremity Assessment Lower Extremity Assessment: Generalized weakness    Cervical / Trunk Assessment Cervical / Trunk Assessment: Normal  Communication   Communication: No difficulties  Cognition Arousal/Alertness: Awake/alert Behavior During Therapy: WFL for tasks assessed/performed Overall Cognitive Status: Within Functional Limits for tasks assessed                                          General Comments      Exercises     Assessment/Plan    PT Assessment All further PT needs can be met in the next venue of care  PT Problem List Decreased strength;Decreased activity tolerance;Decreased balance;Decreased mobility       PT Treatment Interventions      PT Goals (Current goals can be found in the Care Plan section)  Acute Rehab PT Goals Patient Stated Goal: return to SNF LTC PT Goal Formulation: With patient Time For Goal Achievement: 05/17/22 Potential to Achieve Goals: Good    Frequency       Co-evaluation               AM-PAC PT "6 Clicks" Mobility  Outcome Measure Help needed turning from your back to your side while in a flat bed without using bedrails?: A Lot Help needed moving from lying on your back to sitting on the side of a flat bed without using bedrails?: A Lot Help needed moving to and from a bed to a chair (including a wheelchair)?: Total Help needed standing up from a chair  using your arms (e.g., wheelchair or bedside chair)?: Total Help needed to walk in hospital room?: Total Help needed climbing 3-5 steps with a railing? : Total 6 Click Score: 8    End of Session   Activity Tolerance: Patient tolerated treatment well;Patient limited by fatigue;Patient limited by pain Patient left: in bed;with call bell/phone within reach Nurse Communication: Mobility status PT Visit Diagnosis: Muscle weakness (generalized) (M62.81);Difficulty in walking, not elsewhere classified (R26.2);Other abnormalities of gait and mobility (R26.89)    Time: 0822-0852 PT Time Calculation (min) (ACUTE ONLY): 30 min   Charges:   PT Evaluation $PT Eval Moderate Complexity: 1 Mod PT Treatments $Therapeutic Activity: 23-37 mins        10:33 AM, 05/17/22 Lonell Grandchild, MPT Physical Therapist with [facility] -  [phone] office [phone] mobile phone

## 2022-05-17 NOTE — Progress Notes (Signed)
Patient slept through the night during shift. No prn medications given during shift. Most recent vital signs are T 98.5 P 61 RR 16 B/P 160/68 O2 sat 100% on RA. Patient is being monitored by the centralized telemetry department with a sustained heart rhythm of NSR with 1st degree heart block.

## 2022-05-17 NOTE — Progress Notes (Signed)
Chronic systolic heart failure was present on admission.  [place] T. Audie Box, MD, [place]  [address], [place], [place] [address] [phone]  9:21 AM

## 2022-05-17 NOTE — TOC Transition Note (Signed)
Transition of Care [facility]) - CM/SW Discharge Note   Patient Details  Name: Todd [name] MRN: [id number] Date of Birth: 1956/05/07  Transition [facility]) CM/SW Contact:  Shade Flood, LCSW Phone Number: 05/17/2022, 11:08 AM   Clinical Narrative:     Pt stable for dc today per MD. Updated Jackelyn Poling at [facility]. DC clinical sent electronically. RN to call report. EMS arranged.  There are no other TOC needs for dc.  Final next level of care: Long Term Nursing Home Barriers to Discharge: Barriers Resolved   Patient Goals and CMS Choice Patient states their goals for this hospitalization and ongoing recovery are:: return to LTC CMS Medicare.gov Compare Post Acute Care list provided to:: Patient Choice offered to / list presented to : Patient  Discharge Placement                       Discharge Plan and Services In-house Referral: Clinical Social Work Discharge Planning Services: CM Consult Post Acute Care Choice: Nursing Home                               Social Determinants of Health (SDOH) Interventions     Readmission Risk Interventions    05/15/2022   10:57 AM 05/11/2022   11:34 AM  Readmission Risk Prevention Plan  Transportation Screening Complete Complete  Medication Review Press photographer) Complete Complete  PCP or Specialist appointment within 3-5 days of discharge Complete Complete  HRI or Home Care Consult Complete Complete  SW Recovery Care/Counseling Consult Complete Complete  Palliative Care Screening Not Applicable Not Applicable  Skilled Nursing Facility Complete Complete

## 2022-05-17 NOTE — NC FL2 (Signed)
[place] LEVEL OF CARE FORM     IDENTIFICATION  Patient Name: Todd [name] Birthdate: 1955/06/20 Sex: male Admission Date (Current Location): 05/14/2022  [facility] and [place] Number:  [employer] and Address:  [place] [address], [place]      Provider Number: [id number]  Attending Physician Name and Address:  Rodena Goldmann, DO  Relative Name and Phone Number:       Current Level of Care: Hospital Recommended Level of Care: [place] Prior Approval Number:    Date Approved/Denied:   PASRR Number:    Discharge Plan: SNF    Current Diagnoses: Patient Active Problem List   Diagnosis Date Noted   Acute kidney injury superimposed on chronic kidney disease ([place]) 05/15/2022   Compression fracture of body of thoracic vertebra ([place]) 46/56/8127   Acute systolic heart failure ([place]) 05/10/2022   Ventricular tachyarrhythmia ([place]) 05/03/2022   VT (ventricular tachycardia) ([place]) 05/03/2022   Cardiomyopathy ([place]) 08/29/2021   Persistent atrial fibrillation ([place]) 08/29/2021   Stage 3b chronic kidney disease (CKD) ([place]) 08/29/2021   QT prolongation 08/29/2021   CHF (congestive heart failure) ([place]) 05/27/2021   SOB (shortness of breath)    CAP (community acquired pneumonia) 04/20/2021   Elevated brain natriuretic peptide (BNP) level 04/20/2021   Polysubstance abuse ([place]) 04/20/2021   Noncompliance with medication regimen 04/20/2021   Chronic combined systolic and diastolic CHF (congestive heart failure) ([place]) 04/20/2021   Hypoalbuminemia due to protein-calorie malnutrition ([place]) 04/20/2021   GERD (gastroesophageal reflux disease) 04/20/2021   Candida esophagitis (HCC)    Iron deficiency anemia due to chronic blood loss 01/11/2021   Blood in stool    Atrial fibrillation with RVR ([place]) 01/10/2021   Hypomagnesemia 01/10/2021   Severe Hypophosphatemia 12/28/2020   Non-Sustained V-tach in the setting of  Severe Electrolyte Derangement 12/28/2020   Cocaine abuse -on-going 12/28/2020   Alcohol abuse, continuous 12/28/2020   Syncope and collapse-suspect arrhythmia related in the setting of severe electrolyte abnormalities 12/28/2020   Type 2 diabetes mellitus with complication, with long-term current use of insulin ([place]) 12/28/2020   Tobacco abuse 12/28/2020   Atrial fibrillation, chronic ([place]) 12/26/2020   Hypocalcemia 51/70/0174   Acute metabolic encephalopathy 94/49/6759   Hypoglycemia 12/05/2020   AKI (acute kidney injury) ([place]) 12/04/2020   Vitamin D deficiency 06/17/2019   Essential hypertension, benign 08/30/2018   Mixed hyperlipidemia 07/17/2018   Hyponatremia 02/14/2018   Hypokalemia 02/14/2018   Medically noncompliant 02/14/2018   S/P AKA (above knee amputation) bilateral ([place]) 02/12/2018   Current smoker 02/03/2018   Hyperglycemia due to diabetes mellitus ([place]) 02/03/2018   Leukocytosis 02/03/2018   Thrombocytosis 02/03/2018   Urinary incontinence 08/14/2017   Transaminitis    Acute respiratory failure with hypoxia ([place])    Elevated troponin 07/04/2016   Hyperuricemia 06/21/2016   Essential hypertension 01/05/2016   Diabetes type 2, uncontrolled 01/05/2016   Acquired hypothyroidism 05/10/2011    Orientation RESPIRATION BLADDER Height & Weight     Self, Time, Situation, Place  Normal Incontinent Weight: 149 lb 14.6 oz (68 kg) Height:  5' (152.4 cm)  BEHAVIORAL SYMPTOMS/MOOD NEUROLOGICAL BOWEL NUTRITION STATUS      Incontinent Diet (see dc summary)  AMBULATORY STATUS COMMUNICATION OF NEEDS Skin   Total Care Verbally Normal                       Personal Care Assistance Level of Assistance    Bathing Assistance:  Limited assistance Feeding assistance: Limited assistance Dressing Assistance: Limited assistance     Functional Limitations Info    Sight Info: Impaired Hearing Info: Adequate Speech Info: Adequate    SPECIAL CARE FACTORS FREQUENCY                        Contractures Contractures Info: Not present    Additional Factors Info    Code Status Info: Full Allergies Info: Lisinopril Psychotropic Info: Apresoline, Zoloft Insulin Sliding Scale Info: Humalog: inject 3-9 Units into the skin 3 (three) times daily. Sliding scale 90-150-3 units, 151-200- 4units, 200-250 5units, 251-300 6units, 301-350 7units, 351-400 8units,401-450 9units       Current Medications (05/17/2022):  This is the current hospital active medication list Current Facility-Administered Medications  Medication Dose Route Frequency Provider Last Rate Last Admin   acetaminophen (TYLENOL) tablet 650 mg  650 mg Oral Q6H PRN Adefeso, Oladapo, DO       apixaban (ELIQUIS) tablet 5 mg  5 mg Oral BID Adefeso, Oladapo, DO   5 mg at 05/17/22 0802   atorvastatin (LIPITOR) tablet 80 mg  80 mg Oral Daily Adefeso, Oladapo, DO   80 mg at 05/17/22 0802   Chlorhexidine Gluconate Cloth 2 % PADS 6 each  6 each Topical Q0600 Adefeso, Oladapo, DO   6 each at 05/17/22 0530   clopidogrel (PLAVIX) tablet 75 mg  75 mg Oral Q breakfast Adefeso, Oladapo, DO   75 mg at 05/17/22 0802   feeding supplement (ENSURE ENLIVE / ENSURE PLUS) liquid 237 mL  237 mL Oral BID BM Adefeso, Oladapo, DO       hydrALAZINE (APRESOLINE) tablet 50 mg  50 mg Oral Q8H Adefeso, Oladapo, DO   50 mg at 05/17/22 0500   HYDROmorphone (DILAUDID) injection 0.5 mg  0.5 mg Intravenous Q4H PRN Manuella Ghazi, Pratik D, DO   0.5 mg at 05/16/22 1511   insulin aspart (novoLOG) injection 0-5 Units  0-5 Units Subcutaneous QHS Adefeso, Oladapo, DO       insulin aspart (novoLOG) injection 0-9 Units  0-9 Units Subcutaneous TID WC Adefeso, Oladapo, DO   1 Units at 05/16/22 1759   insulin glargine-yfgn (SEMGLEE) injection 8 Units  8 Units Subcutaneous QHS Adefeso, Oladapo, DO   8 Units at 05/16/22 2153   isosorbide mononitrate (IMDUR) 24 hr tablet 60 mg  60 mg Oral Daily Adefeso, Oladapo, DO   60 mg at 05/17/22 0803   lidocaine (LIDODERM) 5  % 1 patch  1 patch Transdermal Q24H Adefeso, Oladapo, DO   1 patch at 05/16/22 2153   metoprolol succinate (TOPROL-XL) 24 hr tablet 100 mg  100 mg Oral Daily Adefeso, Oladapo, DO   100 mg at 05/17/22 0802   mupirocin ointment (BACTROBAN) 2 % 1 Application  1 Application Nasal BID Adefeso, Oladapo, DO   1 Application at 26/71/24 2222   pantoprazole (PROTONIX) EC tablet 40 mg  40 mg Oral BID Adefeso, Oladapo, DO   40 mg at 05/17/22 0802   piperacillin-tazobactam (ZOSYN) IVPB 3.375 g  3.375 g Intravenous Q8H Shah, Pratik D, DO 12.5 mL/hr at 05/17/22 0503 3.375 g at 05/17/22 0503   sertraline (ZOLOFT) tablet 50 mg  50 mg Oral QHS Adefeso, Oladapo, DO   50 mg at 05/16/22 2153   sodium bicarbonate tablet 650 mg  650 mg Oral TID Heath Lark D, DO   650 mg at 05/17/22 0803     Discharge Medications: Please see discharge summary for a list  of discharge medications.  Relevant Imaging Results:  Relevant Lab Results:   Additional Information    Shade Flood, LCSW

## 2022-05-17 NOTE — Discharge Summary (Signed)
Physician Discharge Summary  Kosei Rhodes EGB:[id number] DOB: 1955/11/29 DOA: 05/14/2022  PCP: [name], DO  Admit date: 05/14/2022  Discharge date: 05/17/2022  Admitted From:SNF  Disposition:  SNF  Recommendations for Outpatient Follow-up:  Follow up with PCP in 1-2 weeks Follow-up with neurosurgery with referral sent regarding compression fracture Continue TLSO brace and pain medications with oxycodone and Lidoderm patch as noted below Continue Augmentin as prescribed for aspiration pneumonia coverage for 3 more days Continue other home medication as prior  Home Health: None  Equipment/Devices: TLSO brace  Discharge Condition:Stable  CODE STATUS: Full  Diet recommendation: Heart Healthy/carb modified  Brief/Interim Summary:  Jasier Calabretta is a 66 y.o. male with medical history significant of essential hypertension, hypothyroidism, type 2 diabetes mellitus, HFrEF, CKD IV, chronic atrial fibrillation, PAD status post bilateral AKA, who presents to the emergency department due to several weeks to months of back pain.  He was admitted with AKI on CKD stage IV with poor appetite and intake recently as well as worsening leukocytosis reflective of possible aspiration pneumonia given recent nausea and vomiting.  He is also noted to have a compression fracture to the T11/T12 region that appears to be subacute in nature.  His pain is currently well-controlled on oral medications and Lidoderm patch and he will receive TLSO brace which will help to keep him stabilized.  He will need close follow-up with neurosurgery with referral sent to further evaluate.  He is renal function has returned to baseline with some IV fluid hydration and he will remain on Augmentin for aspiration pneumonia coverage for an additional 3 days for total 5-day course of treatment.  His leukocytosis has improved considerably.  He is in stable condition for discharge today.   Discharge Diagnoses:   Principal Problem:   Acute kidney injury superimposed on chronic kidney disease ([place]) Active Problems:   Essential hypertension   Leukocytosis   Thrombocytosis   Atrial fibrillation, chronic (HCC)   Chronic combined systolic and diastolic CHF (congestive heart failure) (HCC)   Hypoalbuminemia due to protein-calorie malnutrition (HCC)   Compression fracture of body of thoracic vertebra (HCC)  Principal discharge diagnosis: Acute/subacute T11 and T12 compression fractures with AKI on CKD stage IV as well as severe leukocytosis in the setting of aspiration pneumonia.  Discharge Instructions  Discharge Instructions     Ambulatory referral to Neurosurgery   Complete by: As directed    Diet - low sodium heart healthy   Complete by: As directed    If the dressing is still on your incision site when you go home, remove it on the third day after your surgery date. Remove dressing if it begins to fall off, or if it is dirty or damaged before the third day.   Complete by: As directed    Increase activity slowly   Complete by: As directed       Allergies as of 05/17/2022       Reactions   Lisinopril Other (See Comments)   Hyperkalemia / Renal failure        Medication List     TAKE these medications    acetaminophen 325 MG tablet Commonly known as: TYLENOL Take 2 tablets (650 mg total) by mouth every 6 (six) hours as needed for mild pain (or Fever >/= 101). What changed: reasons to take this   allopurinol 100 MG tablet Commonly known as: ZYLOPRIM Take 1 tablet (100 mg total) by mouth daily. What changed: when to take this   amoxicillin-clavulanate 500-125  MG tablet Commonly known as: Augmentin Take 1 tablet by mouth 2 (two) times daily for 3 days.   apixaban 5 MG Tabs tablet Commonly known as: ELIQUIS Take 1 tablet (5 mg total) by mouth 2 (two) times daily.   Ascorbic Acid 500 MG Caps Take 500 mg by mouth daily.   atorvastatin 80 MG tablet Commonly known as:  LIPITOR Take 1 tablet (80 mg total) by mouth daily.   calcium carbonate 500 MG chewable tablet Commonly known as: TUMS - dosed in mg elemental calcium Chew 1,000 mg by mouth 3 (three) times daily before meals.   clopidogrel 75 MG tablet Commonly known as: PLAVIX Take 1 tablet (75 mg total) by mouth daily with breakfast.   cyanocobalamin 500 MCG tablet Commonly known as: V-R VITAMIN B-12 Take 1 tablet (500 mcg total) by mouth daily. What changed: when to take this   folic acid 1 MG tablet Commonly known as: FOLVITE Take 1 mg by mouth in the morning.   Glucagon Emergency 1 MG Kit Inject 1 mg into the muscle as needed (hypoglycemia).   hydrALAZINE 50 MG tablet Commonly known as: APRESOLINE Take 1 tablet (50 mg total) by mouth every 8 (eight) hours.   Admelog SoloStar 100 UNIT/ML KwikPen Generic drug: insulin lispro Inject 16 Units into the skin at bedtime. What changed: Another medication with the same name was changed. Make sure you understand how and when to take each.   insulin lispro 100 UNIT/ML KwikPen Commonly known as: HUMALOG Inject 3-9 Units into the skin 3 (three) times daily. Sliding scale 90-150-3 units, 151-200- 4units, 200-250 5units, 251-300 6units, 301-350 7units, 351-400 8units,401-450 9units What changed:  how much to take when to take this additional instructions   isosorbide mononitrate 60 MG 24 hr tablet Commonly known as: IMDUR Take 1 tablet (60 mg total) by mouth daily.   levothyroxine 100 MCG tablet Commonly known as: SYNTHROID Take 100 mcg by mouth daily before breakfast.   lidocaine 5 % Commonly known as: LIDODERM Place 1 patch onto the skin daily. Remove & Discard patch within 12 hours or as directed by MD   metoprolol succinate 100 MG 24 hr tablet Commonly known as: TOPROL-XL Take 1 tablet (100 mg total) by mouth daily. Take with or immediately following a meal. What changed:  when to take this additional instructions   mexiletine  150 MG capsule Commonly known as: MEXITIL Take 1 capsule (150 mg total) by mouth every 8 (eight) hours.   mirtazapine 7.5 MG tablet Commonly known as: REMERON Take 7.5 mg by mouth at bedtime.   multivitamin with minerals Tabs tablet Take 1 tablet by mouth daily. What changed: when to take this   Nutritional Drink Liqd Take 240 mLs by mouth daily at 12 noon. House Supplement   oxyCODONE 5 MG immediate release tablet Commonly known as: Roxicodone Take 1-2 tablets (5-10 mg total) by mouth every 6 (six) hours as needed for breakthrough pain or severe pain.   pantoprazole 40 MG tablet Commonly known as: PROTONIX Take 1 tablet (40 mg total) by mouth 2 (two) times daily.   polyethylene glycol 17 g packet Commonly known as: MIRALAX / GLYCOLAX Take 17 g by mouth daily. What changed: when to take this   sertraline 50 MG tablet Commonly known as: ZOLOFT Take 1 tablet (50 mg total) by mouth at bedtime.   sodium bicarbonate 650 MG tablet Take 650 mg by mouth 3 (three) times daily before meals.   thiamine 100 MG tablet  Commonly known as: VITAMIN B1 Take 1 tablet (100 mg total) by mouth daily. What changed: when to take this   Vitamin D3 50 MCG (2000 UT) capsule Take 2,000 Units by mouth in the morning.               Discharge Care Instructions  (From admission, onward)           Start     Ordered   05/17/22 0000  If the dressing is still on your incision site when you go home, remove it on the third day after your surgery date. Remove dressing if it begins to fall off, or if it is dirty or damaged before the third day.        05/17/22 0845            Follow-up Information     Simpson-Tarokh, Leann, DO .   Specialty: Family Medicine Contact information: [place] [address] [phone]         Carole Civil, MD. Go to.   Specialties: Orthopedic Surgery, Radiology Contact information: [address] [place]  [address] [phone]                Allergies  Allergen Reactions   Lisinopril Other (See Comments)    Hyperkalemia / Renal failure    Consultations: None   Procedures/Studies: MR LUMBAR SPINE WO CONTRAST  Result Date: 05/16/2022 CLINICAL DATA:  Vertebral osteomyelitis EXAM: MRI LUMBAR SPINE WITHOUT CONTRAST TECHNIQUE: Multiplanar, multisequence MR imaging of the lumbar spine was performed. No intravenous contrast was administered. COMPARISON:  CT 2 days ago.  CT 03/29/2022. FINDINGS: Segmentation:  Five lumbar type vertebral bodies. Alignment:  Minimal scoliotic curvature convex to the right. Vertebrae: T11 and T12 fractures as described on the thoracic exam. No acute bone finding in the lumbar region. Chronic discogenic endplate marrow changes at L5-S1 without active edema. Conus medullaris and cauda equina: Conus extends to the L1-2 level. Conus and cauda equina appear normal. Paraspinal and other soft tissues: Negative Disc levels: No significant finding at L1-2 or L2-3. L3-4: Mild bulging of the disc more towards the left. Mild left foraminal narrowing, but no likely neural compression. L4-5: Bulging of the disc more prominent towards the left. Mild foraminal narrowing on the left but no likely neural compression. L5-S1: Chronic disc degeneration with loss of disc height. Fluid intensity material in the disc space, likely degenerative. No endplate irregularity or edema. Chronic degenerative endplate fatty marrow changes. Endplate osteophytes and mild bulging of the disc. Mild facet osteoarthritis. Mild foraminal narrowing but no likely neural compression. The central canal is sufficiently patent. IMPRESSION: 1. T11 and T12 fractures as described on the thoracic exam. 2. No evidence of lumbar region infection. 3. Chronic degenerative disc disease at L5-S1 with loss of disc height, endplate osteophytes and bulging of the annulus. Mild facet osteoarthritis. Mild bilateral foraminal narrowing  but no likely neural compression. T2 bright material in the disc space, quite likely degenerative. No significant suspicion of infection at this level. 4. Disc bulges at L3-4 and L4-5 more prominent towards the left. Mild left foraminal narrowing but no likely neural compression. Electronically Signed   By: Nelson Chimes M.D.   On: 05/16/2022 09:59   MR THORACIC SPINE WO CONTRAST  Result Date: 05/16/2022 CLINICAL DATA:  Vertebral osteomyelitis EXAM: MRI THORACIC SPINE WITHOUT CONTRAST TECHNIQUE: Multiplanar, multisequence MR imaging of the thoracic spine was performed. No intravenous contrast was administered. COMPARISON:  CT 2  days ago.  CT 03/29/2022. FINDINGS: Alignment:  No malalignment. Vertebrae: No abnormality at T10 or above. Late subacute superior endplate fracture at Q33 with loss of height of less than 10%, stable since the CT scan of 03/29/2022. This looks like a benign fracture. Compression fracture at T12 with loss of height 25%, not progressive since the CT scan of 2 days ago, new since the CT scan of 03/29/2022. No retropulsed bone. This looks like a benign fracture no finding to suggest underlying bone or disc infection. Cord:  Normal Paraspinal and other soft tissues: Pulmonary infiltrates on the right consistent with pneumonia. No sign of paravertebral infection or mass. Disc levels: Normal except for minimal non-compressive disc bulges T5-6 through T8-9. IMPRESSION: 1. No evidence of spinal infection. 2. Late subacute superior endplate fracture at H54, stable/nonprogressive since the CT scan of 03/29/2022. 3. Acute/Subacute compression fracture at T12 with loss of height of 25%, new since the CT scan of 03/29/2022. No visible progression since the study of 2 days ago. No retropulsed bone. No finding to suggest underlying bone or disc infection. 4. Right lung infiltrates consistent with pneumonia. Electronically Signed   By: Nelson Chimes M.D.   On: 05/16/2022 09:55   MR Cervical Spine Wo  Contrast  Result Date: 05/16/2022 CLINICAL DATA:  Vertebral osteomyelitis. EXAM: MRI CERVICAL SPINE WITHOUT CONTRAST TECHNIQUE: Multiplanar, multisequence MR imaging of the cervical spine was performed. No intravenous contrast was administered. COMPARISON:  None Available. FINDINGS: Alignment: Straightening of the normal cervical lordosis. Vertebrae: No focal bone finding. Cord: No primary cord abnormality.  No cord compression. Posterior Fossa, vertebral arteries, paraspinal tissues: Negative Disc levels: The foramen magnum is widely patent. There is ordinary mild osteoarthritis of the C1-2 articulation but no encroachment upon the neural structures. C2-3: Normal C3-4: Minimal disc bulge.  No canal or foraminal stenosis. C4-5: Chronic disc degeneration with loss of disc height. Endplate osteophytes and bulging of the disc. Narrowing of the ventral subarachnoid space but no compressive effect upon cord. Foramina widely patent. C5-6: Bulging of the disc. Narrowing of the ventral subarachnoid space but no compression of the cord. Mild foraminal narrowing on the right, not likely compressive. C6-7: Minimal disc bulge.  No stenosis. C7-T1: No disc abnormality. Facet osteoarthritis on the left. Mild bony foraminal narrowing on the left. IMPRESSION: 1. No evidence of spinal infection in the cervical region. 2. Chronic degenerative spondylosis at C4-5 and C5-6. No compressive canal stenosis. Mild foraminal narrowing on the right at C5-6. 3. Left-sided facet osteoarthritis at C7-T1 with mild foraminal narrowing, not likely compressive. Electronically Signed   By: Nelson Chimes M.D.   On: 05/16/2022 09:49   CT RENAL STONE STUDY  Addendum Date: 05/15/2022   ADDENDUM REPORT: 05/15/2022 00:26 ADDENDUM: It should be noted that chronic, mild to moderate severity nonspecific bilateral perinephric inflammatory fat stranding is seen. This is stable in appearance when compared to the visualized portions of the kidneys on the  prior lumbar spine CT (dated March 29, 2022). Electronically Signed   By: Virgina Norfolk M.D.   On: 05/15/2022 00:26   Result Date: 05/15/2022 CLINICAL DATA:  Left flank pain. EXAM: CT ABDOMEN AND PELVIS WITHOUT CONTRAST TECHNIQUE: Multidetector CT imaging of the abdomen and pelvis was performed following the standard protocol without IV contrast. RADIATION DOSE REDUCTION: This exam was performed according to the departmental dose-optimization program which includes automated exposure control, adjustment of the mA and/or kV according to patient size and/or use of iterative reconstruction technique. COMPARISON:  None Available. FINDINGS: Lower chest: Moderate severity atelectasis and/or infiltrate is seen within the posterior aspect of the right lower lobe. Hepatobiliary: No focal liver abnormality is seen. No gallstones, gallbladder wall thickening, or biliary dilatation. Pancreas: Unremarkable. No pancreatic ductal dilatation or surrounding inflammatory changes. Spleen: Normal in size without focal abnormality. Adrenals/Urinary Tract: Adrenal glands are unremarkable. Kidneys are normal in size, without obstructing renal calculi or hydronephrosis. Multiple bilateral subcentimeter renal calcifications are seen, several of which appear to be vascular in origin. A 9 mm diameter cystic appearing area is seen within the mid left kidney. The urinary bladder is poorly distended and subsequently limited in evaluation. Mild to moderate severity diffuse urinary bladder wall thickening is noted. Stomach/Bowel: Stomach is within normal limits. Appendix appears normal. No evidence of bowel wall thickening, distention, or inflammatory changes. Noninflamed diverticula are seen throughout the descending and sigmoid colon. Vascular/Lymphatic: Aortic atherosclerosis. No enlarged abdominal or pelvic lymph nodes. Reproductive: Prostate is unremarkable. Other: No abdominal wall hernia or abnormality. No abdominopelvic ascites.  Musculoskeletal: A compression fracture deformity is seen at the level of T12. This represents a new finding when compared to the prior lumbar spine CT, dated March 29, 2022. Stable degenerative changes are seen at the levels of L4-L5 and L5-S1. IMPRESSION: 1. Moderate severity right lower lobe atelectasis and/or infiltrate. 2. Colonic diverticulosis. 3. Acute versus subacute compression fracture deformity at the level of T12. MRI correlation is recommended. 4. Stable degenerative changes at the levels of L4-L5 and L5-S1. 5. Aortic atherosclerosis. Aortic Atherosclerosis (ICD10-I70.0). Electronically Signed: By: Virgina Norfolk M.D. On: 05/14/2022 22:46   VAS [place] UPPER EXTREMITY ARTERIAL DUPLEX  Result Date: 05/10/2022  UPPER EXTREMITY DUPLEX STUDY Patient Name:  JESIAH GRISMER  Date of Exam:   05/10/2022 Medical Rec #: [id number]        Accession #:    [id number] Date of Birth: May 28, 1956       Patient Gender: M Patient Age:   81 years Exam Location:  [facility] Procedure:      VAS [place] UPPER EXTREMITY ARTERIAL DUPLEX Referring Phys: Lake Bells O'NEAL --------------------------------------------------------------------------------  Indications: Absent right radial pulse on clinical exam. History:     Patient has a history of catheterization via right radial artery.  Risk Factors:  Hypertension, hyperlipidemia, Diabetes, current smoker, coronary                artery disease. Other Factors: Bilateral AKA Comparison Study: No prior studies. Performing Technologist: Darlin Coco RDMS, RVT  Examination Guidelines: A complete evaluation includes B-mode imaging, spectral Doppler, color Doppler, and power Doppler as needed of all accessible portions of each vessel. Bilateral testing is considered an integral part of a complete examination. Limited examinations for reoccurring indications may be performed as noted.  Right Doppler Findings:  +---------------+----------+---------+--------+--------------------------------+ Site           PSV (cm/s)Waveform StenosisComments                         +---------------+----------+---------+--------+--------------------------------+ Subclavian Prox191       biphasic         Turbulent- suggestive of more                                              proximal obstruction             +---------------+----------+---------+--------+--------------------------------+ Subclavian  Mid 91        biphasic         Dampened                         +---------------+----------+---------+--------+--------------------------------+ Subclavian Dist65        biphasic         Dampened                         +---------------+----------+---------+--------+--------------------------------+ Axillary       154       biphasic                                          +---------------+----------+---------+--------+--------------------------------+ Brachial Prox  90        triphasic                                         +---------------+----------+---------+--------+--------------------------------+ Brachial Mid   71        triphasic                                         +---------------+----------+---------+--------+--------------------------------+ Brachial Dist  57        triphasic                                         +---------------+----------+---------+--------+--------------------------------+ Radial Prox    34        biphasic                                          +---------------+----------+---------+--------+--------------------------------+ Radial Mid     26        biphasic                                          +---------------+----------+---------+--------+--------------------------------+ Radial Dist    29        biphasic                                          +---------------+----------+---------+--------+--------------------------------+  Ulnar Prox     74        triphasic                                         +---------------+----------+---------+--------+--------------------------------+ Ulnar Mid      83        triphasic                                         +---------------+----------+---------+--------+--------------------------------+ Ulnar Dist     80  triphasic                                         +---------------+----------+---------+--------+--------------------------------+ Palmar Arch    59                                                          +---------------+----------+---------+--------+--------------------------------+    Left Doppler Findings: +---------------+----------+---------+--------+--------+ Site           PSV (cm/s)Waveform StenosisComments +---------------+----------+---------+--------+--------+ Subclavian Prox132       triphasic                 +---------------+----------+---------+--------+--------+   Summary:  Right: Turbulent waveforms in the proximal subclavian artery        suggestive of more proximal obstruction. Dampened waveforms        in the mid to distal subclavian artery consistent with        proximal obstruction. Radial artery is patent with some        degree of calcification and dampened waveforms as compared to        the ulnar artery. *See table(s) above for measurements and observations. Electronically signed by Servando Snare MD on 05/10/2022 at 7:02:00 PM.    Final    CARDIAC CATHETERIZATION  Result Date: 05/09/2022 Conclusions: Significant single-vessel coronary artery disease with 60-70% mid LAD stenosis that is hemodynamically significant (RFR = 0.87).  There is mild, non-obstructive disease in the mid LCx and RCA. Normal left ventricular filling pressure (LVEDP 10 mmHg). Successful RFR-guided PCI to mid LAD using synergy 2.75 x 16 mm drug-eluting stent (postdilated to 3.1 mm) with 0% residual stenosis and TIMI-3 flow. Recommendations: Dual  antiplatelet therapy with aspirin and clopidogrel while off apixiban.  Anticipate restarting apixaban 2.5 mg twice daily as soon as tomorrow if there is no evidence of bleeding/vascular injury following catheterization.  Anticipate apixiban + clopidogrel for up to 12 months. Aggressive secondary prevention of coronary artery disease. Further workup of ventricular tachycardia and possible sarcoidosis per primary team/EP. Nelva Bush, MD [facility]  ECHOCARDIOGRAM COMPLETE  Result Date: 05/04/2022    ECHOCARDIOGRAM REPORT   Patient Name:   OUSMAN DISE Date of Exam: 05/04/2022 Medical Rec #:  [id number]       Height:       60.0 in Accession #:    [id number]      Weight:       101.0 lb Date of Birth:  [birth year]      BSA:          1.396 m Patient Age:    76 years        BP:           133/66 mmHg Patient Gender: M               HR:           61 bpm. Exam Location:  Inpatient Procedure: 2D Echo, Cardiac Doppler, Color Doppler and Intracardiac            Opacification Agent Indications:    Ventricular Tachycardia I47.2  History:        Patient has prior history of Echocardiogram examinations, most  recent 04/20/2021. CHF and Cardiomyopathy, PAD,                 Arrythmias:Tachycardia and Atrial Fibrillation,                 Signs/Symptoms:Shortness of Breath; Risk Factors:Hypertension,                 Diabetes, Current Smoker and Dyslipidemia.  Sonographer:    Greer Pickerel Referring Phys: [id number] FAN YE  Sonographer Comments: Image acquisition challenging due to respiratory motion and Image acquisition challenging due to patient body habitus. IMPRESSIONS  1. Left ventricular ejection fraction, by estimation, is 45 to 50%. The left ventricle has mildly decreased function. The left ventricle demonstrates global hypokinesis. There is mild concentric left ventricular hypertrophy. Left ventricular diastolic parameters are consistent with Grade II diastolic dysfunction (pseudonormalization).  2.  Right ventricular systolic function is normal. The right ventricular size is normal. Tricuspid regurgitation signal is inadequate for assessing PA pressure.  3. Left atrial size was severely dilated.  4. Right atrial size was mild to moderately dilated.  5. The mitral valve is grossly normal. No evidence of mitral valve regurgitation. No evidence of mitral stenosis.  6. The aortic valve was not well visualized. Aortic valve regurgitation is not visualized.  7. The inferior vena cava is normal in size with greater than 50% respiratory variability, suggesting right atrial pressure of 3 mmHg. Comparison(s): No significant change from prior study. Prior images reviewed side by side. FINDINGS  Left Ventricle: Left ventricular ejection fraction, by estimation, is 45 to 50%. The left ventricle has mildly decreased function. The left ventricle demonstrates global hypokinesis. Definity contrast agent was given IV to delineate the left ventricular  endocardial borders. The left ventricular internal cavity size was normal in size. There is mild concentric left ventricular hypertrophy. Left ventricular diastolic parameters are consistent with Grade II diastolic dysfunction (pseudonormalization). Right Ventricle: The right ventricular size is normal. No increase in right ventricular wall thickness. Right ventricular systolic function is normal. Tricuspid regurgitation signal is inadequate for assessing PA pressure. Left Atrium: Left atrial size was severely dilated. Right Atrium: Right atrial size was mild to moderately dilated. Pericardium: There is no evidence of pericardial effusion. Mitral Valve: The mitral valve is grossly normal. No evidence of mitral valve regurgitation. No evidence of mitral valve stenosis. Tricuspid Valve: The tricuspid valve is normal in structure. Tricuspid valve regurgitation is not demonstrated. No evidence of tricuspid stenosis. Aortic Valve: The aortic valve was not well visualized. Aortic valve  regurgitation is not visualized. Pulmonic Valve: The pulmonic valve was not well visualized. Pulmonic valve regurgitation is not visualized. Aorta: The aortic root is normal in size and structure. Venous: The inferior vena cava is normal in size with greater than 50% respiratory variability, suggesting right atrial pressure of 3 mmHg. IAS/Shunts: No atrial level shunt detected by color flow Doppler.  LEFT VENTRICLE PLAX 2D LVIDd:         5.10 cm      Diastology LVIDs:         4.30 cm      LV e' medial:    4.68 cm/s LV PW:         1.20 cm      LV E/e' medial:  12.7 LV IVS:        0.80 cm      LV e' lateral:   7.07 cm/s LVOT diam:     1.80 cm      LV  E/e' lateral: 8.4 LV SV:         36 LV SV Index:   26 LVOT Area:     2.54 cm  LV Volumes (MOD) LV vol d, MOD A2C: 88.8 ml LV vol d, MOD A4C: 140.0 ml LV vol s, MOD A2C: 39.1 ml LV vol s, MOD A4C: 84.4 ml LV SV MOD A2C:     49.7 ml LV SV MOD A4C:     140.0 ml LV SV MOD BP:      58.0 ml RIGHT VENTRICLE RV S prime:     10.30 cm/s TAPSE (M-mode): 2.3 cm LEFT ATRIUM              Index        RIGHT ATRIUM           Index LA diam:        5.10 cm  3.65 cm/m   RA Area:     19.50 cm LA Vol (A2C):   118.0 ml 84.52 ml/m  RA Volume:   48.50 ml  34.74 ml/m LA Vol (A4C):   65.2 ml  46.70 ml/m LA Biplane Vol: 88.7 ml  63.54 ml/m  AORTIC VALVE LVOT Vmax:   62.10 cm/s LVOT Vmean:  40.300 cm/s LVOT VTI:    0.140 m  AORTA Ao Root diam: 3.40 cm Ao Asc diam:  2.30 cm MITRAL VALVE MV Area (PHT): 2.63 cm    SHUNTS MV Decel Time: 288 msec    Systemic VTI:  0.14 m MV E velocity: 59.30 cm/s  Systemic Diam: 1.80 cm MV A velocity: 49.50 cm/s MV E/A ratio:  1.20 Rudean Haskell MD Electronically signed by Rudean Haskell MD Signature Date/Time: 05/04/2022/12:11:00 PM    Final    DG Chest Portable 1 View  Result Date: 05/03/2022 CLINICAL DATA:  Shaking episode lasting less than 5 minutes EXAM: PORTABLE CHEST 1 VIEW COMPARISON:  Portable exam 1424 hours compared to 05/27/2021  FINDINGS: External pacing leads. Upper normal heart size. Mediastinal contours and pulmonary vascularity normal. RIGHT basilar atelectasis. No pulmonary infiltrate, pleural effusion, or pneumothorax. Bones demineralized. IMPRESSION: RIGHT basilar atelectasis. Electronically Signed   By: Lavonia Dana M.D.   On: 05/03/2022 14:33     Discharge Exam: Vitals:   05/16/22 2002 05/17/22 0433  BP: (!) 153/85 (!) 160/68  Pulse: 71 61  Resp: 18 16  Temp: 98.1 F (36.7 C) 98.5 F (36.9 C)  SpO2: 100% 100%   Vitals:   05/16/22 0649 05/16/22 0810 05/16/22 2002 05/17/22 0433  BP: (!) 176/79 (!) 164/75 (!) 153/85 (!) 160/68  Pulse: 76 76 71 61  Resp: _0 Temp: 98.4 F (36.9 C)  98.1 F (36.7 C) 98.5 F (36.9 C)  TempSrc: Oral  Oral   SpO2: 100% 100% 100% 100%  Weight:      Height:        General: Pt is alert, awake, not in acute distress Cardiovascular: RRR, S1/S2 +, no rubs, no gallops Respiratory: CTA bilaterally, no wheezing, no rhonchi Abdominal: Soft, NT, ND, bowel sounds + Extremities: Bilateral amputations    The results of significant diagnostics from this hospitalization (including imaging, microbiology, ancillary and laboratory) are listed below for reference.     Microbiology: Recent Results (from the past 240 hour(s))  Blood culture (routine x 2)     Status: None (Preliminary result)   Collection Time: 05/15/22 12:50 AM   Specimen: BLOOD RIGHT HAND  Result Value Ref Range Status  Specimen Description   Final    BLOOD RIGHT HAND BOTTLES DRAWN AEROBIC AND ANAEROBIC   Special Requests Blood Culture adequate volume  Final   Culture   Final    NO GROWTH 2 DAYS Performed at [facility], [address], [place], [place] [address]    Report Status PENDING  Incomplete  Blood culture (routine x 2)     Status: None (Preliminary result)   Collection Time: 05/15/22 12:55 AM   Specimen: BLOOD RIGHT HAND  Result Value Ref Range Status   Specimen Description   Final     BLOOD RIGHT HAND BOTTLES DRAWN AEROBIC AND ANAEROBIC   Special Requests Blood Culture adequate volume  Final   Culture   Final    NO GROWTH 2 DAYS Performed at [facility], [address], [place], [place] [address]    Report Status PENDING  Incomplete     Labs: BNP (last 3 results) Recent Labs    05/27/21 0427  BNP 563.8*   Basic Metabolic Panel: Recent Labs  Lab 05/11/22 0324 05/14/22 2201 05/15/22 0900 05/16/22 0536 05/17/22 0436  NA 138 141  --  142 142  K 5.0 5.0  --  3.9 3.9  CL 116* 117*  --  120* 118*  CO2 14* 13*  --  14* 14*  GLUCOSE 197* 137*  --  95 73  BUN 33* 48*  --  36* 32*  CREATININE 2.65* 3.73*  --  2.67* 2.60*  CALCIUM 7.7* 8.2*  --  7.2* 7.5*  MG 2.6*  --  2.0 1.8 1.8  PHOS  --   --  4.4  --   --    Liver Function Tests: Recent Labs  Lab 05/14/22 2201 05/16/22 0536  AST 35 35  ALT 25 21  ALKPHOS 198* 164*  BILITOT 0.8 0.5  PROT 8.5* 7.1  ALBUMIN 2.9* 2.4*   Recent Labs  Lab 05/14/22 2201  LIPASE 29   No results for input(s): "AMMONIA" in the last 168 hours. CBC: Recent Labs  Lab 05/14/22 2201 05/16/22 0536 05/17/22 0436  WBC 49.8* 20.8* 16.8*  NEUTROABS 44.5*  --   --   HGB 11.9* 9.9* 10.5*  HCT 36.0* 31.0* 31.7*  MCV 84.1 85.4 84.1  PLT 742* 587* 585*   Cardiac Enzymes: No results for input(s): "CKTOTAL", "CKMB", "CKMBINDEX", "TROPONINI" in the last 168 hours. BNP: Invalid input(s): "POCBNP" CBG: Recent Labs  Lab 05/16/22 1116 05/16/22 1730 05/16/22 2007 05/16/22 2233 05/17/22 0724  GLUCAP 85 137* 95 99 79   D-Dimer No results for input(s): "DDIMER" in the last 72 hours. Hgb A1c No results for input(s): "HGBA1C" in the last 72 hours. Lipid Profile No results for input(s): "CHOL", "HDL", "LDLCALC", "TRIG", "CHOLHDL", "LDLDIRECT" in the last 72 hours. Thyroid function studies No results for input(s): "TSH", "T4TOTAL", "T3FREE", "THYROIDAB" in the last 72 hours.  Invalid input(s): "FREET3" Anemia work  up Recent Labs    05/16/22 0948  VITAMINB12 584  FOLATE 26.1  FERRITIN 649*  TIBC 231*  IRON 28*  RETICCTPCT 1.1   Urinalysis    Component Value Date/Time   COLORURINE AMBER (A) 05/14/2022 2201   APPEARANCEUR HAZY (A) 05/14/2022 2201   APPEARANCEUR Clear 02/09/2018 1352   LABSPEC 1.026 05/14/2022 2201   PHURINE 5.0 05/14/2022 2201   GLUCOSEU NEGATIVE 05/14/2022 2201   HGBUR NEGATIVE 05/14/2022 2201   BILIRUBINUR NEGATIVE 05/14/2022 2201   BILIRUBINUR Negative 02/09/2018 1352   KETONESUR NEGATIVE 05/14/2022 2201   PROTEINUR 100 (  A) 05/14/2022 2201   NITRITE NEGATIVE 05/14/2022 2201   LEUKOCYTESUR NEGATIVE 05/14/2022 2201   Sepsis Labs Recent Labs  Lab 05/14/22 2201 05/16/22 0536 05/17/22 0436  WBC 49.8* 20.8* 16.8*   Microbiology Recent Results (from the past 240 hour(s))  Blood culture (routine x 2)     Status: None (Preliminary result)   Collection Time: 05/15/22 12:50 AM   Specimen: BLOOD RIGHT HAND  Result Value Ref Range Status   Specimen Description   Final    BLOOD RIGHT HAND BOTTLES DRAWN AEROBIC AND ANAEROBIC   Special Requests Blood Culture adequate volume  Final   Culture   Final    NO GROWTH 2 DAYS Performed at [facility], [address] [place], [place] [address]    Report Status PENDING  Incomplete  Blood culture (routine x 2)     Status: None (Preliminary result)   Collection Time: 05/15/22 12:55 AM   Specimen: BLOOD RIGHT HAND  Result Value Ref Range Status   Specimen Description   Final    BLOOD RIGHT HAND BOTTLES DRAWN AEROBIC AND ANAEROBIC   Special Requests Blood Culture adequate volume  Final   Culture   Final    NO GROWTH 2 DAYS Performed at [facility], [address] [place], [place] [address]    Report Status PENDING  Incomplete     Time coordinating discharge: 35 minutes  SIGNED:   Rodena Goldmann, DO [facility] 05/17/2022, 8:52 AM  If 7PM-7AM, please contact night-coverage [email or web address]

## 2022-05-19 ENCOUNTER — Telehealth: Payer: Self-pay | Admitting: Orthopedic Surgery

## 2022-05-19 NOTE — Telephone Encounter (Signed)
[facility] in [place] [phone]) called and wants to schedule a hospital f/u for this pt.  We were not on call.  Please review and advise if okay to schedule.

## 2022-05-19 NOTE — Telephone Encounter (Signed)
Returned the call to [facility] in [place] [phone]), advised her that we were not on call, that Dr. Georgeanna Harrison was.  I provided her with his number [phone].

## 2022-05-20 LAB — CULTURE, BLOOD (ROUTINE X 2)
Culture: NO GROWTH
Culture: NO GROWTH
Special Requests: ADEQUATE
Special Requests: ADEQUATE

## 2022-05-23 ENCOUNTER — Telehealth: Payer: Self-pay | Admitting: Cardiology

## 2022-05-23 NOTE — Progress Notes (Unsigned)
Cardiology Office Note:    Date:  05/24/2022  ID:  Todd [name], DOB 1955-08-19, MRN [id number]  PCP:  [name], [name] Cardiologist:  [name], MD Electrophysiologist:  [name], MD     Referring MD: [name], *   CC: Here for hospital follow-up  History of Present Illness:    Todd [name] is a 66 y.o. male with a hx of the following:  CAD, s/p PCI/DES to mLAD in 04/2022 Cardiomyopathy, EF 45-50% V-tach -> Cardioversion in 2023 PAF HFrEF HTN PAD, s/p bilateral AKA Dyslipidemia T2DM Hx of polysubstance abuse Hypothyroidism CKD stage 4  Patient is a 66 year old male with past medical history as mentioned above.  Was admitted in November 2022 with acute hypoxic respiratory failure due to CHF and pneumonia.  Hospital course was complicated by sustained VT and acute kidney injury, and cardiology was consulted.  Was placed on amiodarone 400 mg daily and cardiac cath was not done due to history of CKD.  He has been following up with EP for management of A-fib.  Was treated for acute systolic CHF.  Echocardiogram at that time revealed EF 45 to 50%, and GDMT included Imdur, hydralazine, and beta-blocker.  Was told to follow-up with nephrology as outpatient.  Last seen by Dr. [name] on August 30, 2021.  Was overall doing well from a cardiac perspective and denied any new cardiac complaints or concerns.  Denied any chest pain, palpitations, or any new shortness of breath.  Because of prolonged QT interval, amiodarone was stopped.   Presented to [facility] ED on May 03, 2022 with chief complaint of shaking episode at nursing facility, was [facility] prior to arrival as well as having N&V, and compression fracture of T12 region, was found to have leukocytosis due to possible aspiration PNA. Was in wide complex tachycardia on arrival and consented to cardioversion, was given a dose of etomidate and underwent  successful cardioversion.  Labs did not indicate electrolyte abnormalities.  Cardiology was consulted because of this.  It was stated that in the past he was not a good ICD candidate given his history of substance abuse.  Mexiletine was added to medication regimen and left heart cath was recommended for ischemic evaluation.  An outpatient PET scan recommended by Dr. [name]. Was admitted with AKI on CKD stage IV, also had poor appetite with worsening leukocytosis due to possible aspiration pneumonia.  Echo revealed EF 45 to 50%, global hypokinesis of left ventricle, mild LVH, grade 2 diastolic dysfunction dilation along atria, no significant change from prior study.  Underwent left heart cath on May 09, 2022 that revealed significant vessel CAD with 60 to 70% mid LAD stenosis that was hemodynamically significant, mild, nonobstructive disease also noted in the mid left circumflex and RCA with normal left ventricular filling pressure.  He had successful RFR guided PCI to mid LAD with drug-eluting stent placed, DAPT with aspirin and Plavix while off Eliquis recommended.  Anticipate restarting Eliquis 2.5 mg twice daily next day if there is no evidence of bleeding or vascular injury after catheterization and anticipate Eliquis with Plavix for up to 12 months after cardiac cath.  Aggressive secondary prevention of CAD and further workup of V. tach and possible sarcoidosis per primary team/APP.  After cardiac cath it was found that he had absent right radial pulse, vascular ultrasound obtained and revealed turbulent waveforms in proximal subclavian artery suggestive of more proximal obstruction, dampened waveforms in the mid to distal  subclavian artery were consistent with proximal obstruction, radial artery is patent with some degree of calcification and dampened waveforms as compared to the ulnar artery.  [facility] presented back to [facility] on 05/14/22 with CC of back pain and emesis. Admitted to sharp, left flank pain x  several days. Had several days of nausea and vomiting.  Labs revealed leukocytosis and AKI.  CT scan revealed new T12 fracture, possible right-sided infiltrate concerning for aspiration pneumonia. Was admitted for observation. Referral sent to Neurosurgery and given a TLSO brace for stabilization. Kidney fxn returned to baseline. Given ABX at d/c for aspiration PNA, leukocytosis improved. Was d/c in stable condition on 05/17/2022.   Today he presents for follow-up from [facility] in [place], [place]. His transportation driver is present in room with him. Patient states he is doing well after hospital d/c. Only CC today is back pain and is wearing his TLSO brace at Rehab facility. Denies any CP, SHOB, palpitations, syncope, presyncope, dizziness, orthopnea, PND, swelling, significant weight changes, acute bleeding, or claudication. Denies any alcohol use, illicit drug use, but does admit to tobacco use - smokes around 2 cigarettes per day. Denies any questions or concerns today.   Past Medical History:  Diagnosis Date   AKI (acute kidney injury) ([place])    Alcohol abuse    Cocaine abuse ([place]) 01/25/2021   Constipated    Diabetes mellitus without complication (HCC)    Diarrhea    Elevated LFTs    Goiter    Gout    Hyperlipidemia    Hypertension    Leukocytosis    Reactive thrombocytosis    Right BKA infection ([place]) 08/2016   Right leg pain    Sepsis due to undetermined organism [facility])    Thyroid disease    Wound infection after surgery 08/2016    Past Surgical History:  Procedure Laterality Date   ABDOMINAL AORTOGRAM N/A 08/11/2016   Procedure: Abdominal Aortogram;  Surgeon: Waynetta Sandy, MD;  Location: [place] [facility];  Service: Cardiovascular;  Laterality: N/A;   ABDOMINAL AORTOGRAM W/LOWER EXTREMITY N/A 08/15/2016   Procedure: Abdominal Aortogram w/Lower Extremity;  Surgeon: Elam Dutch, MD;  Location: [place] [facility];  Service: Cardiovascular;   Laterality: N/A;   AMPUTATION Right 08/17/2016   Procedure: RIGHT BELOW KNEE AMPUTATION;  Surgeon: Elam Dutch, MD;  Location: [place];  Service: Vascular;  Laterality: Right;   AMPUTATION Right 09/12/2016   Procedure: AMPUTATION ABOVE KNEE;  Surgeon: Newt Minion, MD;  Location: [place];  Service: Orthopedics;  Laterality: Right;   AMPUTATION Left 08/12/2016   Procedure: LEFT BELOW KNEE AMPUTATION;  Surgeon: Newt Minion, MD;  Location: [place];  Service: Orthopedics;  Laterality: Left;   AMPUTATION Left 11/01/2017   Procedure: LEFT ABOVE KNEE AMPUTATION;  Surgeon: Newt Minion, MD;  Location: [place];  Service: Orthopedics;  Laterality: Left;   APPLICATION OF WOUND VAC Right 09/12/2016   Procedure: APPLICATION OF WOUND VAC ABOVE KNEE;  Surgeon: Newt Minion, MD;  Location: [place];  Service: Orthopedics;  Laterality: Right;   BIOPSY  01/13/2021   Procedure: BIOPSY;  Surgeon: Rogene Houston, MD;  [facility];  Service: Endoscopy;;  esophageal   COLONOSCOPY WITH PROPOFOL N/A 01/13/2021   Procedure: COLONOSCOPY WITH PROPOFOL;  Surgeon: Rogene Houston, MD;  [facility];  Service: Endoscopy;  Laterality: N/A;   CORONARY STENT INTERVENTION N/A 05/09/2022   Procedure: CORONARY STENT INTERVENTION;  Surgeon:  End, Harrell Gave, MD;  Location: [place] [facility];  Service: Cardiovascular;  Laterality: N/A;   ESOPHAGOGASTRODUODENOSCOPY (EGD) WITH PROPOFOL N/A 01/13/2021   Procedure: ESOPHAGOGASTRODUODENOSCOPY (EGD) WITH PROPOFOL;  Surgeon: Rogene Houston, MD;  [facility];  Service: Endoscopy;  Laterality: N/A;   INTRAVASCULAR PRESSURE WIRE/FFR STUDY N/A 05/09/2022   Procedure: INTRAVASCULAR PRESSURE WIRE/FFR STUDY;  Surgeon: Nelva Bush, MD;  Location: [place] [facility];  Service: Cardiovascular;  Laterality: N/A;   LEFT HEART CATH AND CORONARY ANGIOGRAPHY N/A 05/09/2022   Procedure: LEFT HEART CATH AND CORONARY ANGIOGRAPHY;  Surgeon: Nelva Bush, MD;  Location: [place] [facility];  Service: Cardiovascular;  Laterality: N/A;   LOWER EXTREMITY ANGIOGRAPHY Bilateral 08/11/2016   Procedure: Lower Extremity Angiography;  Surgeon: Waynetta Sandy, MD;  Location: [place] [facility];  Service: Cardiovascular;  Laterality: Bilateral;   PERIPHERAL VASCULAR BALLOON ANGIOPLASTY Left 08/11/2016   Procedure: Peripheral Vascular Balloon Angioplasty;  Surgeon: Waynetta Sandy, MD;  Location: [place] [facility];  Service: Cardiovascular;  Laterality: Left;  SFA   POLYPECTOMY  01/13/2021   Procedure: POLYPECTOMY;  Surgeon: Rogene Houston, MD;  [facility];  Service: Endoscopy;;   THYROID SURGERY      Current Medications: Current Meds  Medication Sig   acetaminophen (TYLENOL) 325 MG tablet Take 2 tablets (650 mg total) by mouth every 6 (six) hours as needed for mild pain (or Fever >/= 101). (Patient taking differently: Take 650 mg by mouth every 6 (six) hours as needed for moderate pain.)   allopurinol (ZYLOPRIM) 100 MG tablet Take 1 tablet (100 mg total) by mouth daily. (Patient taking differently: Take 100 mg by mouth in the morning.)   apixaban (ELIQUIS) 5 MG TABS tablet Take 1 tablet (5 mg total) by mouth 2 (two) times daily.   Ascorbic Acid 500 MG CAPS Take 500 mg by mouth daily.   calcium carbonate (TUMS - DOSED IN MG ELEMENTAL CALCIUM) 500 MG chewable tablet Chew 1,000 mg by mouth 3 (three) times daily before meals.   Cholecalciferol (VITAMIN D3) 50 MCG (2000 UT) capsule Take 2,000 Units by mouth in the morning.   cyanocobalamin (V-R VITAMIN B-12) 500 MCG tablet Take 1 tablet (500 mcg total) by mouth daily. (Patient taking differently: Take 500 mcg by mouth in the morning.)   folic acid (FOLVITE) 1 MG tablet Take 1 mg by mouth in the morning.   Glucagon, rDNA, (GLUCAGON EMERGENCY) 1 MG KIT Inject 1 mg into the muscle as needed (hypoglycemia).   insulin lispro (ADMELOG SOLOSTAR) 100 UNIT/ML KwikPen Inject 16 Units into the skin at bedtime.    insulin lispro (HUMALOG) 100 UNIT/ML KwikPen Inject 3-9 Units into the skin 3 (three) times daily. Sliding scale 90-150-3 units, 151-200- 4units, 200-250 5units, 251-300 6units, 301-350 7units, 351-400 8units,401-450 9units (Patient taking differently: Inject 0-6 Units into the skin See admin instructions. Inject 0-10 units 3 times daily before meals and at bedtime per sliding scale: CBG < 150 : 0 units CBG 151-200 : 1 units CBG 201-250 : 2 units CBG 251-300 : 3 units CBG 301-350 : 4 units CBG 351-400 : 5 units (over 400 give 10 units and call provider) CBG 401-500 : 6 units)   levothyroxine (SYNTHROID) 100 MCG tablet Take 100 mcg by mouth daily before breakfast.   lidocaine (LIDODERM) 5 % Place 1 patch onto the skin daily. Remove & Discard patch within 12 hours or as directed by MD   mexiletine (MEXITIL) 150 MG  capsule Take 1 capsule (150 mg total) by mouth every 8 (eight) hours.   mirtazapine (REMERON) 7.5 MG tablet Take 7.5 mg by mouth at bedtime.   Multiple Vitamin (MULTIVITAMIN WITH MINERALS) TABS tablet Take 1 tablet by mouth daily. (Patient taking differently: Take 1 tablet by mouth in the morning.)   Nutritional Supplements (NUTRITIONAL DRINK) LIQD Take 240 mLs by mouth daily at 12 noon. House Supplement   oxyCODONE (ROXICODONE) 5 MG immediate release tablet Take 1-2 tablets (5-10 mg total) by mouth every 6 (six) hours as needed for breakthrough pain or severe pain.   pantoprazole (PROTONIX) 40 MG tablet Take 1 tablet (40 mg total) by mouth 2 (two) times daily.   polyethylene glycol (MIRALAX / GLYCOLAX) 17 g packet Take 17 g by mouth daily. (Patient taking differently: Take 17 g by mouth in the morning.)   sertraline (ZOLOFT) 50 MG tablet Take 1 tablet (50 mg total) by mouth at bedtime.   sodium bicarbonate 650 MG tablet Take 650 mg by mouth 3 (three) times daily before meals.   thiamine 100 MG tablet Take 1 tablet (100 mg total) by mouth daily. (Patient taking differently: Take 100 mg  by mouth in the morning.)   atorvastatin (LIPITOR) 80 MG tablet Take 1 tablet (80 mg total) by mouth daily.   clopidogrel (PLAVIX) 75 MG tablet Take 1 tablet (75 mg total) by mouth daily with breakfast.   hydrALAZINE (APRESOLINE) 50 MG tablet Take 1 tablet (50 mg total) by mouth every 8 (eight) hours.   isosorbide mononitrate (IMDUR) 60 MG 24 hr tablet Take 1 tablet (60 mg total) by mouth daily.    metoprolol succinate (TOPROL-XL) 100 MG 24 hr tablet Take 1 tablet (100 mg total) by mouth daily. Take with or immediately following a meal. (Patient taking differently: Take 100 mg by mouth in the morning.)     Allergies:   Lisinopril   Social History   Socioeconomic History   Marital status: Single    Spouse name: Not on file   Number of children: Not on file   Years of education: Not on file   Highest education level: Not on file  Occupational History   Occupation: retired    Comment: drove a Medical illustrator  Tobacco Use   Smoking status: Every Day    Packs/day: 0.25    Years: 45.00    Total pack years: 11.25    Types: Cigarettes    Last attempt to quit: 11/12/2014    Years since quitting: 7.5    Passive exposure: Never   Smokeless tobacco: Never  Vaping Use   Vaping Use: Never used  Substance and Sexual Activity   Alcohol use: Not Currently    Alcohol/week: 2.0 standard drinks of alcohol    Types: 1 Glasses of wine, 1 Cans of beer per week   Drug use: Not Currently    Types: Cocaine   Sexual activity: Not Currently  Other Topics Concern   Not on file  Social History Narrative   09/23/20 - Lives alone, uses a wheelchair, double amputee, not married, no children - HHA comes in 3x per week to help him bathe and set up weekly meds.   Social Determinants of Health   Financial Resource Strain: Low Risk  (09/23/2020)   Overall Financial Resource Strain (CARDIA)    Difficulty of Paying Living Expenses: Not very hard  Food Insecurity: No Food Insecurity (05/16/2022)   Hunger Vital Sign     Worried About Running Out of  Food in the Last Year: Never true    [place] in the Last Year: Never true  Transportation Needs: No Transportation Needs (05/04/2022)   PRAPARE - Hydrologist (Medical): No    Lack of Transportation (Non-Medical): No  Physical Activity: Inactive (09/23/2020)   Exercise Vital Sign    Days of Exercise per Week: 0 days    Minutes of Exercise per Session: 0 min  Stress: No Stress Concern Present (09/23/2020)   [place]    Feeling of Stress : Only a little  Social Connections: Socially Isolated (09/23/2020)   Social Connection and Isolation Panel [NHANES]    Frequency of Communication with Friends and Family: More than three times a week    Frequency of Social Gatherings with Friends and Family: More than three times a week    Attends Religious Services: Never    Marine scientist or Organizations: No    Attends Music therapist: Never    Marital Status: Never married     Family History: The patient's family history includes Diabetes in his maternal aunt and maternal uncle; Heart disease in his mother; Pneumonia in his father.  ROS:   Review of Systems  Constitutional: Negative.   HENT: Negative.    Eyes: Negative.   Respiratory: Negative.    Cardiovascular: Negative.   Gastrointestinal: Negative.   Genitourinary: Negative.   Musculoskeletal:  Positive for back pain.  Skin: Negative.   Neurological:  Positive for weakness. Negative for dizziness, tingling, tremors, sensory change, speech change, focal weakness, seizures, loss of consciousness and headaches.  Endo/Heme/Allergies: Negative.   Psychiatric/Behavioral: Negative.      Please see the history of present illness.    All other systems reviewed and are negative.  EKGs/Labs/Other Studies Reviewed:    The following studies were reviewed today:   EKG:  EKG is not  ordered today.   Upper extremity arterial duplex on 05/10/2022: Summary:    Right: Turbulent waveforms in the proximal subclavian artery         suggestive of more proximal obstruction. Dampened waveforms         in the mid to distal subclavian artery consistent with         proximal obstruction. Radial artery is patent with some         degree of calcification and dampened waveforms as compared to         the ulnar artery.  Left heart cath on May 09, 2022: Conclusions: Significant single-vessel coronary artery disease with 60-70% mid LAD stenosis that is hemodynamically significant (RFR = 0.87).  There is mild, non-obstructive disease in the mid LCx and RCA. Normal left ventricular filling pressure (LVEDP 10 mmHg). Successful RFR-guided PCI to mid LAD using synergy 2.75 x 16 mm drug-eluting stent (postdilated to 3.1 mm) with 0% residual stenosis and TIMI-3 flow.   Recommendations: Dual antiplatelet therapy with aspirin and clopidogrel while off apixiban.  Anticipate restarting apixaban 2.5 mg twice daily as soon as tomorrow if there is no evidence of bleeding/vascular injury following catheterization.  Anticipate apixiban + clopidogrel for up to 12 months. Aggressive secondary prevention of coronary artery disease. Further workup of ventricular tachycardia and possible sarcoidosis per primary team/EP.  2D complete echocardiogram on May 04, 2022: 1. Left ventricular ejection fraction, by estimation, is 45 to 50%. The  left ventricle has mildly decreased function. The left ventricle  demonstrates global  hypokinesis. There is mild concentric left ventricular  hypertrophy. Left ventricular diastolic  parameters are consistent with Grade II diastolic dysfunction  (pseudonormalization).   2. Right ventricular systolic function is normal. The right ventricular  size is normal. Tricuspid regurgitation signal is inadequate for assessing  PA pressure.   3. Left atrial size was  severely dilated.   4. Right atrial size was mild to moderately dilated.   5. The mitral valve is grossly normal. No evidence of mitral valve  regurgitation. No evidence of mitral stenosis.   6. The aortic valve was not well visualized. Aortic valve regurgitation  is not visualized.   7. The inferior vena cava is normal in size with greater than 50%  respiratory variability, suggesting right atrial pressure of 3 mmHg.   Comparison(s): No significant change from prior study. Prior images  reviewed side by side.  Cardiac MRI on July 22, 2021: IMPRESSION: Mildly decreased LV function.   There is multifocal and septal late gadolinium enhancement and scattered increase in ECV.   Clinically less consistent with cardiac amyloidosis.   In the setting of VT, consider PET assessment for Cardiac Sarcoidosis if clinically indicated.  Bilateral carotid duplex on 10/09/2018: < 50% stenosis along Left and Right ICA's.    Recent Labs: 05/27/2021: B Natriuretic Peptide 753.0 05/03/2022: TSH 4.318 05/16/2022: ALT 21 05/17/2022: BUN 32; Creatinine, Ser 2.60; Hemoglobin 10.5; Magnesium 1.8; Platelets 585; Potassium 3.9; Sodium 142  Recent Lipid Panel    Component Value Date/Time   CHOL 47 05/06/2022 0416   CHOL 59 (L) 09/22/2020 0923   TRIG 65 05/06/2022 0416   HDL 22 (L) 05/06/2022 0416   HDL 25 (L) 09/22/2020 0923   CHOLHDL 2.1 05/06/2022 0416   VLDL 13 05/06/2022 0416   LDLCALC 12 05/06/2022 0416   LDLCALC 19 09/22/2020 0923     Risk Assessment/Calculations:    CHA2DS2-VASc Score = 5  This indicates a 7.2% annual risk of stroke. The patient's score is based upon: CHF History: 1 HTN History: 1 Diabetes History: 1 Stroke History: 0 Vascular Disease History: 1 Age Score: 1 Gender Score: 0   Physical Exam:    VS:  BP (!) 100/58   Pulse 62   SpO2 98%     Wt Readings from Last 3 Encounters:  05/14/22 149 lb 14.6 oz (68 kg)  05/09/22 149 lb 0.5 oz (67.6 kg)  03/29/22 97  lb (44 kg)     GEN: Thin, 66 y.o. male in no acute distress, sitting in wheelchair HEENT: Normal NECK: No JVD; No carotid bruits CARDIAC: S1/S2, RRR, no murmurs, rubs, gallops; 2+ radial pulses, UTA PT due to bilateral BKA RESPIRATORY:  Clear and diminished to auscultation without rales, wheezing or rhonchi  ABDOMEN: Soft, non-tender, non-distended, bowel sounds x 4 MUSCULOSKELETAL:  No edema; bilateral BKA, generalized weakness SKIN: Warm and dry NEUROLOGIC:  Alert and oriented x 3, mumbled speech occasionally (at baseline per his report).  PSYCHIATRIC:  Normal, pleasant affect   ASSESSMENT:    1. V-tach ([place])   2. Coronary artery disease involving native heart without angina pectoris, unspecified vessel or lesion type   3. Dyslipidemia   4. Medication management   5. Heart failure with mildly reduced ejection fraction (HFmrEF) (HCC)   6. PAF (paroxysmal atrial fibrillation) ([place])   7. PAD (peripheral artery disease) ([place])   8. Essential hypertension   9. Tobacco abuse   10. CKD (chronic kidney disease) stage 4, GFR 15-29 ml/min (HCC)    PLAN:  In order of problems listed above:  V-tach Hx of previous V-tach. Was in wide complex tachycardia on arrival to ED in 04/2022 and consented to cardioversion, was given a dose of etomidate and underwent successful cardioversion.  Labs did not indicate electrolyte abnormalities.  It was recommended for outpatient PET; however, after further review of chart PET scan is contraindicated for patients who are insulin dependent, canceled order. Continue to follow-up with EP and will arrange establishing care with Dr. Myles Gip within the next month. Will consult Dr. Dellia Cloud, who will next establish care with patient. Continue Mexiletine. Heart healthy diet recommended. Will obtain BMET and Mag. Not a surgical candidate.   2. CAD, PCI/DES to mLAD in 04/2022 After cardiac cath it was found that he had absent right radial pulse, vascular ultrasound  obtained and revealed turbulent waveforms in proximal subclavian artery suggestive of more proximal obstruction, dampened waveforms in the mid to distal subclavian artery were consistent with proximal obstruction, radial artery is patent with some degree of calcification and dampened waveforms as compared to the ulnar artery. 2+ radial pulses on exam. Pt denies any complications. Will route note to Dr. Dellia Cloud regarding this. Continue atorvastatin, plavix, imdur, and toprol XL. Heart healthy diet encouraged.   3. Dyslipidemia Labs from 04/2022 revealed HDL 22, LDL 12, triglycerides 65, and previous total cholesterol 59. Continue atorvastatin. Heart healthy diet recommended. Will obtain FLP and LFT in 2 months.   4. HFmrEF EF mildly reduced at 45-50% 04/2022. Continue Toprol XL, Imdur, and decreasing hydralazine as mentioned below. Wt stable. Low sodium diet, fluid restriction <2L, and daily weights encouraged. Educated to contact our office for weight gain of 2 lbs overnight or 5 lbs in one week. Heart healthy diet encouraged. GDMT limited d/t hx of CKD stage 4.   5. PAF Denies any tachycardia or palpitations. Continue Toprol XL. No longer on Amiodarone because of hx of QT prolongation. Continue Eliquis 5 mg BID, currently on appropriate dosage, denies any bleeding while on Eliquis. Continue to follow up with EP. Will obtain CBC.   6. PAD, s/p bilateral aka Denies any problems. Continue current medication management. Continue to f/u with [facility] and PCP. Heart healthy diet recommended.   7. HTN BP today is soft at 100/58. Overall asymptomatic. Unsure of BP readings at SNF. Will reduce hydralazine to 25 mg TID. Continue rest of current medication regimen. Obtaining labs as mentioned above. Heart healthy diet recommended.   8. Tobacco abuse States he currently smokes 2 cigarettes per day. Smoking cessation discussed and encouraged.   9. CKD stage 4 Had AKI on CKD during hospitalization. Most recent  labs showed sCr 2.60 with eGFR at 26. Continue to f/u with PCP and [employer]. Avoid nephrotoxic agents. Obtaining BMET.   10. Disposition: Follow-up with Dr. Myles Gip or APP in 1 month and follow-up with Dr. Dellia Cloud in 3 months or sooner if needed. Refilling cardiac medications.    Medication Adjustments/Labs and Tests Ordered: Current medicines are reviewed at length with the patient today.  Concerns regarding medicines are outlined above.  Orders Placed This Encounter  Procedures   Lipid panel   Hepatic function panel   CBC   Basic metabolic panel   Magnesium   Meds ordered this encounter  Medications   hydrALAZINE (APRESOLINE) 25 MG tablet    Sig: Take 1 tablet (25 mg total) by mouth 3 (three) times daily.    Dispense:  270 tablet    Refill:  3    Dose  decreased 05/24/2022   atorvastatin (LIPITOR) 80 MG tablet    Sig: Take 1 tablet (80 mg total) by mouth daily.    Dispense:  90 tablet    Refill:  3   clopidogrel (PLAVIX) 75 MG tablet    Sig: Take 1 tablet (75 mg total) by mouth daily with breakfast.    Dispense:  90 tablet    Refill:  3   isosorbide mononitrate (IMDUR) 60 MG 24 hr tablet    Sig: Take 1 tablet (60 mg total) by mouth daily.    Dispense:  90 tablet    Refill:  3   metoprolol succinate (TOPROL-XL) 100 MG 24 hr tablet    Sig: Take 1 tablet (100 mg total) by mouth daily.    Dispense:  90 tablet    Refill:  3    Patient Instructions  Medication Instructions:  Decrease Hydralazine to 44m three times per day  Plavix, Lipitor, Imdur, Metoprolol refilled today  Continue all other medications.     Labwork: CBC, BMET, Mg - please do today or this week FLP, LFT - please do in 2 months (fasting) Office will contact with results via phone, letter or mychart.  Follow-Up: Establish with Dr. MDellia Cloudin 3-4 months  Follow up with Dr. MMyles Gipin the next month for v-tach.   Any Other Special Instructions Will Be Listed Below (If  Applicable).   If you need a refill on your cardiac medications before your next appointment, please call your pharmacy.    Signed, EFinis Bud NP  05/25/2022 7:47 PM    [place]

## 2022-05-23 NOTE — Telephone Encounter (Signed)
Will forward to provider - OV scheduled for tomorrow at 10:00 am with Finis Bud, NP.  DPR is in chart for sister.

## 2022-05-23 NOTE — Telephone Encounter (Signed)
Sister is asking for a call after the patient visit on tomorrow. Please advise

## 2022-05-23 NOTE — Progress Notes (Deleted)
Cardiology Office Note:    Date:  05/23/2022   ID:  Todd [name], DOB Jan 11, 1956, MRN [id number]  PCP:  [name], [facility] [place] [facility] Cardiologist:  Minus Breeding, MD Electrophysiologist:  [name], MD { Click to update primary MD,subspecialty MD or APP then REFRESH:1}    Referring MD: [name], *   No chief complaint on file. ***  History of Present Illness:    Todd [name] is a 66 y.o. male with a hx of ***  Past Medical History:  Diagnosis Date   AKI (acute kidney injury) ([place])    Alcohol abuse    Cocaine abuse ([place]) 01/25/2021   Constipated    Diabetes mellitus without complication (HCC)    Diarrhea    Elevated LFTs    Goiter    Gout    Hyperlipidemia    Hypertension    Leukocytosis    Reactive thrombocytosis    Right BKA infection ([place]) 08/2016   Right leg pain    Sepsis due to undetermined organism [facility])    Thyroid disease    Wound infection after surgery 08/2016    Past Surgical History:  Procedure Laterality Date   ABDOMINAL AORTOGRAM N/A 08/11/2016   Procedure: Abdominal Aortogram;  Surgeon: Waynetta Sandy, MD;  Location: [place] [facility];  Service: Cardiovascular;  Laterality: N/A;   ABDOMINAL AORTOGRAM W/LOWER EXTREMITY N/A 08/15/2016   Procedure: Abdominal Aortogram w/Lower Extremity;  Surgeon: Elam Dutch, MD;  Location: [place] [facility];  Service: Cardiovascular;  Laterality: N/A;   AMPUTATION Right 08/17/2016   Procedure: RIGHT BELOW KNEE AMPUTATION;  Surgeon: Elam Dutch, MD;  Location: [place];  Service: Vascular;  Laterality: Right;   AMPUTATION Right 09/12/2016   Procedure: AMPUTATION ABOVE KNEE;  Surgeon: Newt Minion, MD;  Location: [place];  Service: Orthopedics;  Laterality: Right;   AMPUTATION Left 08/12/2016   Procedure: LEFT BELOW KNEE AMPUTATION;  Surgeon: Newt Minion, MD;  Location: [place];  Service: Orthopedics;  Laterality: Left;   AMPUTATION Left 11/01/2017    Procedure: LEFT ABOVE KNEE AMPUTATION;  Surgeon: Newt Minion, MD;  Location: [place];  Service: Orthopedics;  Laterality: Left;   APPLICATION OF WOUND VAC Right 09/12/2016   Procedure: APPLICATION OF WOUND VAC ABOVE KNEE;  Surgeon: Newt Minion, MD;  Location: [place];  Service: Orthopedics;  Laterality: Right;   BIOPSY  01/13/2021   Procedure: BIOPSY;  Surgeon: Rogene Houston, MD;  [facility];  Service: Endoscopy;;  esophageal   COLONOSCOPY WITH PROPOFOL N/A 01/13/2021   Procedure: COLONOSCOPY WITH PROPOFOL;  Surgeon: Rogene Houston, MD;  [facility];  Service: Endoscopy;  Laterality: N/A;   CORONARY STENT INTERVENTION N/A 05/09/2022   Procedure: CORONARY STENT INTERVENTION;  Surgeon: Nelva Bush, MD;  Location: [place] [facility];  Service: Cardiovascular;  Laterality: N/A;   ESOPHAGOGASTRODUODENOSCOPY (EGD) WITH PROPOFOL N/A 01/13/2021   Procedure: ESOPHAGOGASTRODUODENOSCOPY (EGD) WITH PROPOFOL;  Surgeon: Rogene Houston, MD;  [facility];  Service: Endoscopy;  Laterality: N/A;   INTRAVASCULAR PRESSURE WIRE/FFR STUDY N/A 05/09/2022   Procedure: INTRAVASCULAR PRESSURE WIRE/FFR STUDY;  Surgeon: Nelva Bush, MD;  Location: [place] [facility];  Service: Cardiovascular;  Laterality: N/A;   LEFT HEART CATH AND CORONARY ANGIOGRAPHY N/A 05/09/2022   Procedure: LEFT HEART CATH AND CORONARY ANGIOGRAPHY;  Surgeon: Nelva Bush, MD;  Location: [place] [facility];  Service: Cardiovascular;  Laterality: N/A;   LOWER  EXTREMITY ANGIOGRAPHY Bilateral 08/11/2016   Procedure: Lower Extremity Angiography;  Surgeon: Waynetta Sandy, MD;  Location: [place] [facility];  Service: Cardiovascular;  Laterality: Bilateral;   PERIPHERAL VASCULAR BALLOON ANGIOPLASTY Left 08/11/2016   Procedure: Peripheral Vascular Balloon Angioplasty;  Surgeon: Waynetta Sandy, MD;  Location: [place] [facility];  Service: Cardiovascular;  Laterality: Left;  SFA   POLYPECTOMY   01/13/2021   Procedure: POLYPECTOMY;  Surgeon: Rogene Houston, MD;  [facility];  Service: Endoscopy;;   THYROID SURGERY      Current Medications: No outpatient medications have been marked as taking for the 05/24/22 encounter (Appointment) with Finis Bud, NP.     Allergies:   Lisinopril   Social History   Socioeconomic History   Marital status: Single    Spouse name: Not on file   Number of children: Not on file   Years of education: Not on file   Highest education level: Not on file  Occupational History   Occupation: retired    Comment: drove a Medical illustrator  Tobacco Use   Smoking status: Every Day    Packs/day: 0.25    Years: 45.00    Total pack years: 11.25    Types: Cigarettes    Last attempt to quit: 11/12/2014    Years since quitting: 7.5    Passive exposure: Never   Smokeless tobacco: Never  Vaping Use   Vaping Use: Never used  Substance and Sexual Activity   Alcohol use: Not Currently    Alcohol/week: 2.0 standard drinks of alcohol    Types: 1 Glasses of wine, 1 Cans of beer per week   Drug use: Not Currently    Types: Cocaine   Sexual activity: Not Currently  Other Topics Concern   Not on file  Social History Narrative   09/23/20 - Lives alone, uses a wheelchair, double amputee, not married, no children - [facility]HA comes in 3x per week to help him bathe and set up weekly meds.   Social Determinants of Health   Financial Resource Strain: Low Risk  (09/23/2020)   Overall Financial Resource Strain (CARDIA)    Difficulty of Paying Living Expenses: Not very hard  Food Insecurity: No Food Insecurity (05/16/2022)   Hunger Vital Sign    Worried About Running Out of Food in the Last Year: Never true    Ran Out of Food in the Last Year: Never true  Transportation Needs: No Transportation Needs (05/04/2022)   PRAPARE - Hydrologist (Medical): No    Lack of Transportation (Non-Medical): No  Physical Activity: Inactive  (09/23/2020)   Exercise Vital Sign    Days of Exercise per Week: 0 days    Minutes of Exercise per Session: 0 min  Stress: No Stress Concern Present (09/23/2020)   [place]    Feeling of Stress : Only a little  Social Connections: Socially Isolated (09/23/2020)   Social Connection and Isolation Panel [NHANES]    Frequency of Communication with Friends and Family: More than three times a week    Frequency of Social Gatherings with Friends and Family: More than three times a week    Attends Religious Services: Never    Marine scientist or Organizations: No    Attends Music therapist: Never    Marital Status: Never married     Family History: The patient's ***family history includes Diabetes in his maternal aunt and  maternal uncle; Heart disease in his mother; Pneumonia in his father.  ROS:   Please see the history of present illness.    *** All other systems reviewed and are negative.  EKGs/Labs/Other Studies Reviewed:    The following studies were reviewed today: ***  EKG:  EKG is *** ordered today.  The ekg ordered today demonstrates ***  Recent Labs: 05/27/2021: B Natriuretic Peptide 753.0 05/03/2022: TSH 4.318 05/16/2022: ALT 21 05/17/2022: BUN 32; Creatinine, Ser 2.60; Hemoglobin 10.5; Magnesium 1.8; Platelets 585; Potassium 3.9; Sodium 142  Recent Lipid Panel    Component Value Date/Time   CHOL 47 05/06/2022 0416   CHOL 59 (L) 09/22/2020 0923   TRIG 65 05/06/2022 0416   HDL 22 (L) 05/06/2022 0416   HDL 25 (L) 09/22/2020 0923   CHOLHDL 2.1 05/06/2022 0416   VLDL 13 05/06/2022 0416   LDLCALC 12 05/06/2022 0416   LDLCALC 19 09/22/2020 0923     Risk Assessment/Calculations:   {Does this patient have ATRIAL FIBRILLATION?:[phone]}  No BP recorded.  {Refresh Note OR Click here to enter BP  :1}***         Physical Exam:    VS:  There were no vitals taken for this visit.     Wt Readings from Last 3 Encounters:  05/14/22 149 lb 14.6 oz (68 kg)  05/09/22 149 lb 0.5 oz (67.6 kg)  03/29/22 97 lb (44 kg)     GEN: *** Well nourished, well developed in no acute distress HEENT: Normal NECK: No JVD; No carotid bruits LYMPHATICS: No lymphadenopathy CARDIAC: ***RRR, no murmurs, rubs, gallops RESPIRATORY:  Clear to auscultation without rales, wheezing or rhonchi  ABDOMEN: Soft, non-tender, non-distended MUSCULOSKELETAL:  No edema; No deformity  SKIN: Warm and dry NEUROLOGIC:  Alert and oriented x 3 PSYCHIATRIC:  Normal affect   ASSESSMENT:    No diagnosis found. PLAN:    In order of problems listed above:  ***  {The patient has an active order for outpatient cardiac rehabilitation.   Please indicate if the patient is ready to start. Do NOT delete this.  It will auto delete.  Refresh note, then sign.              Click here to document readiness and see contraindications.  :1}  Cardiac Rehabilitation Eligibility Assessment       {Are you ordering a CV Procedure (e.g. stress test, cath, DCCV, TEE, etc)?   Press F2        :[id number]}    Medication Adjustments/Labs and Tests Ordered: Current medicines are reviewed at length with the patient today.  Concerns regarding medicines are outlined above.  No orders of the defined types were placed in this encounter.  No orders of the defined types were placed in this encounter.   There are no Patient Instructions on file for this visit.   SignedFinis Bud, NP  05/23/2022 8:42 AM    [place]

## 2022-05-24 ENCOUNTER — Encounter: Payer: Self-pay | Admitting: *Deleted

## 2022-05-24 ENCOUNTER — Encounter: Payer: Self-pay | Admitting: Nurse Practitioner

## 2022-05-24 ENCOUNTER — Ambulatory Visit: Payer: Medicare (Managed Care) | Attending: Nurse Practitioner | Admitting: Nurse Practitioner

## 2022-05-24 VITALS — BP 100/58 | HR 62

## 2022-05-24 DIAGNOSIS — I10 Essential (primary) hypertension: Secondary | ICD-10-CM

## 2022-05-24 DIAGNOSIS — Z79899 Other long term (current) drug therapy: Secondary | ICD-10-CM

## 2022-05-24 DIAGNOSIS — I472 Ventricular tachycardia, unspecified: Secondary | ICD-10-CM

## 2022-05-24 DIAGNOSIS — E7800 Pure hypercholesterolemia, unspecified: Secondary | ICD-10-CM

## 2022-05-24 DIAGNOSIS — I2510 Atherosclerotic heart disease of native coronary artery without angina pectoris: Secondary | ICD-10-CM

## 2022-05-24 DIAGNOSIS — Z720 Tobacco use: Secondary | ICD-10-CM

## 2022-05-24 DIAGNOSIS — E785 Hyperlipidemia, unspecified: Secondary | ICD-10-CM | POA: Diagnosis not present

## 2022-05-24 DIAGNOSIS — N1832 Chronic kidney disease, stage 3b: Secondary | ICD-10-CM

## 2022-05-24 DIAGNOSIS — I5022 Chronic systolic (congestive) heart failure: Secondary | ICD-10-CM

## 2022-05-24 DIAGNOSIS — I4819 Other persistent atrial fibrillation: Secondary | ICD-10-CM

## 2022-05-24 DIAGNOSIS — N184 Chronic kidney disease, stage 4 (severe): Secondary | ICD-10-CM

## 2022-05-24 DIAGNOSIS — I739 Peripheral vascular disease, unspecified: Secondary | ICD-10-CM

## 2022-05-24 DIAGNOSIS — I480 Paroxysmal atrial fibrillation: Secondary | ICD-10-CM

## 2022-05-24 MED ORDER — ATORVASTATIN CALCIUM 80 MG PO TABS: 80.0000 mg | ORAL_TABLET | Freq: Every day | ORAL | 3 refills | Status: AC

## 2022-05-24 MED ORDER — METOPROLOL SUCCINATE ER 100 MG PO TB24: 100.0000 mg | ORAL_TABLET | Freq: Every day | ORAL | 3 refills | Status: AC

## 2022-05-24 MED ORDER — ISOSORBIDE MONONITRATE ER 60 MG PO TB24: 60.0000 mg | ORAL_TABLET | Freq: Every day | ORAL | 3 refills | Status: AC

## 2022-05-24 MED ORDER — HYDRALAZINE HCL 25 MG PO TABS: 25.0000 mg | ORAL_TABLET | Freq: Three times a day (TID) | ORAL | 3 refills | Status: DC

## 2022-05-24 MED ORDER — CLOPIDOGREL BISULFATE 75 MG PO TABS: 75.0000 mg | ORAL_TABLET | Freq: Every day | ORAL | 3 refills | Status: DC

## 2022-05-24 NOTE — Patient Instructions (Addendum)
Medication Instructions:  Decrease Hydralazine to '25mg'$  three times per day  Plavix, Lipitor, Imdur, Metoprolol refilled today  Continue all other medications.     Labwork: CBC, BMET, Mg - please do today or this week FLP, LFT - please do in 2 months (fasting) Office will contact with results via phone, letter or mychart.  Testing/Procedures: PET stress test   Follow-Up: Establish with Dr. Dellia Cloud in 3-4 months  Follow up with Dr. Myles Gip in the next month for v-tach.   Any Other Special Instructions Will Be Listed Below (If Applicable).   If you need a refill on your cardiac medications before your next appointment, please call your pharmacy.

## 2022-05-25 ENCOUNTER — Telehealth: Payer: Self-pay | Admitting: Nurse Practitioner

## 2022-05-25 NOTE — Telephone Encounter (Signed)
Pt sister calling for how pt's appt went. Pt filled out another DPR however he only put one of his sister's names on there so I wasn't sure if she could be told or not how pt's appt went. She states if pt needs to update DPR, maybe he can just give verbal consent.

## 2022-05-26 NOTE — Telephone Encounter (Signed)
Advised that new DPR does not have her listed and that she can have listed sister M. Adams to review mychart information that is available. Verbalized understanding. Advised that a new DPR is needed. Verbalized understanding.

## 2022-05-26 NOTE — Telephone Encounter (Signed)
Discussed 05/24/22 visit with patient while sister [name] was on phone.

## 2022-05-26 NOTE — Telephone Encounter (Signed)
Pt sister called back with pt on the phone and she would like to know how the pt's OV went with verbal consent from the pt. Please advise

## 2022-06-09 ENCOUNTER — Inpatient Hospital Stay: Payer: Medicare (Managed Care) | Attending: Hematology

## 2022-06-09 DIAGNOSIS — D75839 Thrombocytosis, unspecified: Secondary | ICD-10-CM | POA: Diagnosis present

## 2022-06-09 DIAGNOSIS — D72828 Other elevated white blood cell count: Secondary | ICD-10-CM | POA: Insufficient documentation

## 2022-06-09 DIAGNOSIS — E538 Deficiency of other specified B group vitamins: Secondary | ICD-10-CM | POA: Diagnosis present

## 2022-06-09 DIAGNOSIS — D649 Anemia, unspecified: Secondary | ICD-10-CM | POA: Insufficient documentation

## 2022-06-09 LAB — COMPREHENSIVE METABOLIC PANEL
ALT: 17 U/L (ref 0–44)
AST: 21 U/L (ref 15–41)
Albumin: 3 g/dL — ABNORMAL LOW (ref 3.5–5.0)
Alkaline Phosphatase: 164 U/L — ABNORMAL HIGH (ref 38–126)
Anion gap: 8 (ref 5–15)
BUN: 34 mg/dL — ABNORMAL HIGH (ref 8–23)
CO2: 17 mmol/L — ABNORMAL LOW (ref 22–32)
Calcium: 8.5 mg/dL — ABNORMAL LOW (ref 8.9–10.3)
Chloride: 116 mmol/L — ABNORMAL HIGH (ref 98–111)
Creatinine, Ser: 2.31 mg/dL — ABNORMAL HIGH (ref 0.61–1.24)
GFR, Estimated: 30 mL/min — ABNORMAL LOW (ref >60)
Glucose, Bld: 100 mg/dL — ABNORMAL HIGH (ref 70–99)
Potassium: 4.3 mmol/L (ref 3.5–5.1)
Sodium: 141 mmol/L (ref 135–145)
Total Bilirubin: 0.5 mg/dL (ref 0.3–1.2)
Total Protein: 8.3 g/dL — ABNORMAL HIGH (ref 6.5–8.1)

## 2022-06-09 LAB — CBC WITH DIFFERENTIAL/PLATELET
Abs Immature Granulocytes: 0.03 10*3/uL (ref 0.00–0.07)
Basophils Absolute: 0.1 10*3/uL (ref 0.0–0.1)
Basophils Relative: 1 %
Eosinophils Absolute: 0.8 10*3/uL — ABNORMAL HIGH (ref 0.0–0.5)
Eosinophils Relative: 9 %
HCT: 34.3 % — ABNORMAL LOW (ref 39.0–52.0)
Hemoglobin: 10.9 g/dL — ABNORMAL LOW (ref 13.0–17.0)
Immature Granulocytes: 0 %
Lymphocytes Relative: 29 %
Lymphs Abs: 2.8 10*3/uL (ref 0.7–4.0)
MCH: 27.1 pg (ref 26.0–34.0)
MCHC: 31.8 g/dL (ref 30.0–36.0)
MCV: 85.3 fL (ref 80.0–100.0)
Monocytes Absolute: 0.5 10*3/uL (ref 0.1–1.0)
Monocytes Relative: 5 %
Neutro Abs: 5.5 10*3/uL (ref 1.7–7.7)
Neutrophils Relative %: 56 %
Platelets: 592 10*3/uL — ABNORMAL HIGH (ref 150–400)
RBC: 4.02 MIL/uL — ABNORMAL LOW (ref 4.22–5.81)
RDW: 15.9 % — ABNORMAL HIGH (ref 11.5–15.5)
WBC: 9.7 10*3/uL (ref 4.0–10.5)
nRBC: 0 % (ref 0.0–0.2)

## 2022-06-09 LAB — IRON AND TIBC
Iron: 64 ug/dL (ref 45–182)
Saturation Ratios: 27 % (ref 17.9–39.5)
TIBC: 235 ug/dL — ABNORMAL LOW (ref 250–450)
UIBC: 171 ug/dL

## 2022-06-09 LAB — FERRITIN: Ferritin: 718 ng/mL — ABNORMAL HIGH (ref 24–336)

## 2022-06-09 LAB — VITAMIN B12: Vitamin B-12: 823 pg/mL (ref 180–914)

## 2022-06-13 LAB — METHYLMALONIC ACID, SERUM: Methylmalonic Acid, Quantitative: 228 nmol/L (ref 0–378)

## 2022-06-15 NOTE — Progress Notes (Unsigned)
Todd [name], [place] [address]   CLINIC:  Medical Oncology/Hematology  PCP:  [name], DO [place] [address] [phone]   REASON FOR VISIT:  Follow-up for thrombocytosis and leukocytosis  PRIOR THERAPY: None  CURRENT THERAPY: Surveillance  INTERVAL HISTORY:  [name] 67 y.o. male returns for routine follow-up of his thrombocytosis and leukocytosis.  He was seen for initial consultation by Tarri Abernethy PA-C on 02/09/2022.  He is accompanied today by ***.  Patient participates in visit, but is somewhat of an unreliable historian.  ***  Since his last visit, he has been hospitalized twice:  05/03/2022 through 05/11/2022: Sustained ventricular tachycardia and obstructive CAD s/p PCI to mid LAD with DES.  Cardiac MRI was suspicious for cardiac sarcoidosis. 05/14/2022 through 05/17/2022: Back pain secondary to subacute compression fracture of T11/T12, discharged with TLSO brace and instructions for neurosurgery follow-up.  He was also treated with antibiotics for suspected aspiration pneumonia with noted improvement in his leukocytosis. *** He has not had any recent steroid medications.  Last antibiotics were in December 2023 for treatment of suspected aspiration pneumonia. *** No interval DVT or PE. *** No symptoms of aquagenic pruritus or vasomotor symptoms.   *** No B symptoms such as fever, chills, night sweats, unintentional weight loss.  He has ***% energy and ***% appetite. He endorses that he is maintaining a stable weight.   REVIEW OF SYSTEMS: *** Review of Systems  Constitutional:  Negative for appetite change, chills, diaphoresis, fatigue, fever and unexpected weight change.  HENT:   Negative for lump/mass and nosebleeds.   Eyes:  Negative for eye problems.  Respiratory:  Negative for cough, hemoptysis and shortness of breath.   Cardiovascular:  Negative for chest pain, leg swelling and  palpitations.  Gastrointestinal:  Negative for abdominal pain, blood in stool, constipation, diarrhea, nausea and vomiting.  Genitourinary:  Negative for hematuria.   Skin: Negative.   Neurological:  Negative for dizziness, headaches and light-headedness.  Hematological:  Does not bruise/bleed easily.     PAST MEDICAL/SURGICAL HISTORY:  Past Medical History:  Diagnosis Date   AKI (acute kidney injury) ([facility])    Alcohol abuse    Cocaine abuse ([place]) 01/25/2021   Constipated    Diabetes mellitus without complication (HCC)    Diarrhea    Elevated LFTs    Goiter    Gout    Hyperlipidemia    Hypertension    Leukocytosis    Reactive thrombocytosis    Right BKA infection ([place]) 08/2016   Right leg pain    Sepsis due to undetermined organism [facility])    Thyroid disease    Wound infection after surgery 08/2016   Past Surgical History:  Procedure Laterality Date   ABDOMINAL AORTOGRAM N/A 08/11/2016   Procedure: Abdominal Aortogram;  Surgeon: Waynetta Sandy, MD;  Location: [place] [facility];  Service: Cardiovascular;  Laterality: N/A;   ABDOMINAL AORTOGRAM W/LOWER EXTREMITY N/A 08/15/2016   Procedure: Abdominal Aortogram w/Lower Extremity;  Surgeon: Elam Dutch, MD;  Location: [facility];  Service: Cardiovascular;  Laterality: N/A;   AMPUTATION Right 08/17/2016   Procedure: RIGHT BELOW KNEE AMPUTATION;  Surgeon: Elam Dutch, MD;  Location: [place];  Service: Vascular;  Laterality: Right;   AMPUTATION Right 09/12/2016   Procedure: AMPUTATION ABOVE KNEE;  Surgeon: Newt Minion, MD;  Location: [place];  Service: Orthopedics;  Laterality: Right;   AMPUTATION Left 08/12/2016   Procedure: LEFT  BELOW KNEE AMPUTATION;  Surgeon: Newt Minion, MD;  Location: [place];  Service: Orthopedics;  Laterality: Left;   AMPUTATION Left 11/01/2017   Procedure: LEFT ABOVE KNEE AMPUTATION;  Surgeon: Newt Minion, MD;  Location: [place];  Service: Orthopedics;  Laterality: Left;   APPLICATION OF  WOUND VAC Right 09/12/2016   Procedure: APPLICATION OF WOUND VAC ABOVE KNEE;  Surgeon: Newt Minion, MD;  Location: [place];  Service: Orthopedics;  Laterality: Right;   BIOPSY  01/13/2021   Procedure: BIOPSY;  Surgeon: Rogene Houston, MD;  [facility];  Service: Endoscopy;;  esophageal   COLONOSCOPY WITH PROPOFOL N/A 01/13/2021   Procedure: COLONOSCOPY WITH PROPOFOL;  Surgeon: Rogene Houston, MD;  [facility];  Service: Endoscopy;  Laterality: N/A;   CORONARY STENT INTERVENTION N/A 05/09/2022   Procedure: CORONARY STENT INTERVENTION;  Surgeon: Nelva Bush, MD;  Location: [place] [facility];  Service: Cardiovascular;  Laterality: N/A;   ESOPHAGOGASTRODUODENOSCOPY (EGD) WITH PROPOFOL N/A 01/13/2021   Procedure: ESOPHAGOGASTRODUODENOSCOPY (EGD) WITH PROPOFOL;  Surgeon: Rogene Houston, MD;  [facility];  Service: Endoscopy;  Laterality: N/A;   INTRAVASCULAR PRESSURE WIRE/FFR STUDY N/A 05/09/2022   Procedure: INTRAVASCULAR PRESSURE WIRE/FFR STUDY;  Surgeon: Nelva Bush, MD;  Location: [place] [facility];  Service: Cardiovascular;  Laterality: N/A;   LEFT HEART CATH AND CORONARY ANGIOGRAPHY N/A 05/09/2022   Procedure: LEFT HEART CATH AND CORONARY ANGIOGRAPHY;  Surgeon: Nelva Bush, MD;  Location: [place] [facility];  Service: Cardiovascular;  Laterality: N/A;   LOWER EXTREMITY ANGIOGRAPHY Bilateral 08/11/2016   Procedure: Lower Extremity Angiography;  Surgeon: Waynetta Sandy, MD;  Location: [place] [facility];  Service: Cardiovascular;  Laterality: Bilateral;   PERIPHERAL VASCULAR BALLOON ANGIOPLASTY Left 08/11/2016   Procedure: Peripheral Vascular Balloon Angioplasty;  Surgeon: Waynetta Sandy, MD;  Location: [facility] [facility];  Service: Cardiovascular;  Laterality: Left;  SFA   POLYPECTOMY  01/13/2021   Procedure: POLYPECTOMY;  Surgeon: Rogene Houston, MD;  [facility];  Service: Endoscopy;;   THYROID SURGERY        SOCIAL HISTORY:  Social History   Socioeconomic History   Marital status: Single    Spouse name: Not on file   Number of children: Not on file   Years of education: Not on file   Highest education level: Not on file  Occupational History   Occupation: retired    Comment: drove a Medical illustrator  Tobacco Use   Smoking status: Every Day    Packs/day: 0.25    Years: 45.00    Total pack years: 11.25    Types: Cigarettes    Last attempt to quit: 11/12/2014    Years since quitting: 7.5    Passive exposure: Never   Smokeless tobacco: Never  Vaping Use   Vaping Use: Never used  Substance and Sexual Activity   Alcohol use: Not Currently    Alcohol/week: 2.0 standard drinks of alcohol    Types: 1 Glasses of wine, 1 Cans of beer per week   Drug use: Not Currently    Types: Cocaine   Sexual activity: Not Currently  Other Topics Concern   Not on file  Social History Narrative   09/23/20 - Lives alone, uses a wheelchair, double amputee, not married, no children - HHA comes in 3x per week to help him bathe and set up weekly meds.   Social Determinants of Health   Financial Resource Strain: Low Risk  (  09/23/2020)   Overall Financial Resource Strain (CARDIA)    Difficulty of Paying Living Expenses: Not very hard  Food Insecurity: No Food Insecurity (05/16/2022)   Hunger Vital Sign    Worried About Running Out of Food in the Last Year: Never true    Ran Out of Food in the Last Year: Never true  Transportation Needs: No Transportation Needs (05/04/2022)   PRAPARE - Hydrologist (Medical): No    Lack of Transportation (Non-Medical): No  Physical Activity: Inactive (09/23/2020)   Exercise Vital Sign    Days of Exercise per Week: 0 days    Minutes of Exercise per Session: 0 min  Stress: No Stress Concern Present (09/23/2020)   [place]    Feeling of Stress : Only a little  Social Connections:  Socially Isolated (09/23/2020)   Social Connection and Isolation Panel [NHANES]    Frequency of Communication with Friends and Family: More than three times a week    Frequency of Social Gatherings with Friends and Family: More than three times a week    Attends Religious Services: Never    Marine scientist or Organizations: No    Attends Archivist Meetings: Never    Marital Status: Never married  Intimate Partner Violence: Not At Risk (05/04/2022)   Humiliation, Afraid, Rape, and Kick questionnaire    Fear of Current or Ex-Partner: No    Emotionally Abused: No    Physically Abused: No    Sexually Abused: No    FAMILY HISTORY:  Family History  Problem Relation Age of Onset   Heart disease Mother    Pneumonia Father    Diabetes Maternal Aunt    Diabetes Maternal Uncle     CURRENT MEDICATIONS:  Outpatient Encounter Medications as of 06/16/2022  Medication Sig Note   acetaminophen (TYLENOL) 325 MG tablet Take 2 tablets (650 mg total) by mouth every 6 (six) hours as needed for mild pain (or Fever >/= 101). (Patient taking differently: Take 650 mg by mouth every 6 (six) hours as needed for moderate pain.)    allopurinol (ZYLOPRIM) 100 MG tablet Take 1 tablet (100 mg total) by mouth daily. (Patient taking differently: Take 100 mg by mouth in the morning.)    apixaban (ELIQUIS) 5 MG TABS tablet Take 1 tablet (5 mg total) by mouth 2 (two) times daily.    Ascorbic Acid 500 MG CAPS Take 500 mg by mouth daily.    atorvastatin (LIPITOR) 80 MG tablet Take 1 tablet (80 mg total) by mouth daily.    calcium carbonate (TUMS - DOSED IN MG ELEMENTAL CALCIUM) 500 MG chewable tablet Chew 1,000 mg by mouth 3 (three) times daily before meals.    Cholecalciferol (VITAMIN D3) 50 MCG (2000 UT) capsule Take 2,000 Units by mouth in the morning.    clopidogrel (PLAVIX) 75 MG tablet Take 1 tablet (75 mg total) by mouth daily with breakfast.    cyanocobalamin (V-R VITAMIN B-12) 500 MCG tablet Take  1 tablet (500 mcg total) by mouth daily. (Patient taking differently: Take 500 mcg by mouth in the morning.)    folic acid (FOLVITE) 1 MG tablet Take 1 mg by mouth in the morning.    Glucagon, rDNA, (GLUCAGON EMERGENCY) 1 MG KIT Inject 1 mg into the muscle as needed (hypoglycemia). 05/04/2022: Listed PRN on MAR, no doses received in November 2023.   hydrALAZINE (APRESOLINE) 25 MG tablet Take 1 tablet (  25 mg total) by mouth 3 (three) times daily.    insulin lispro (ADMELOG SOLOSTAR) 100 UNIT/ML KwikPen Inject 16 Units into the skin at bedtime.    insulin lispro (HUMALOG) 100 UNIT/ML KwikPen Inject 3-9 Units into the skin 3 (three) times daily. Sliding scale 90-150-3 units, 151-200- 4units, 200-250 5units, 251-300 6units, 301-350 7units, 351-400 8units,401-450 9units (Patient taking differently: Inject 0-6 Units into the skin See admin instructions. Inject 0-10 units 3 times daily before meals and at bedtime per sliding scale: CBG < 150 : 0 units CBG 151-200 : 1 units CBG 201-250 : 2 units CBG 251-300 : 3 units CBG 301-350 : 4 units CBG 351-400 : 5 units (over 400 give 10 units and call provider) CBG 401-500 : 6 units)    isosorbide mononitrate (IMDUR) 60 MG 24 hr tablet Take 1 tablet (60 mg total) by mouth daily.    levothyroxine (SYNTHROID) 100 MCG tablet Take 100 mcg by mouth daily before breakfast.    lidocaine (LIDODERM) 5 % Place 1 patch onto the skin daily. Remove & Discard patch within 12 hours or as directed by MD    metoprolol succinate (TOPROL-XL) 100 MG 24 hr tablet Take 1 tablet (100 mg total) by mouth daily.    mexiletine (MEXITIL) 150 MG capsule Take 1 capsule (150 mg total) by mouth every 8 (eight) hours.    mirtazapine (REMERON) 7.5 MG tablet Take 7.5 mg by mouth at bedtime.    Multiple Vitamin (MULTIVITAMIN WITH MINERALS) TABS tablet Take 1 tablet by mouth daily. (Patient taking differently: Take 1 tablet by mouth in the morning.)    Nutritional Supplements (NUTRITIONAL DRINK)  LIQD Take 240 mLs by mouth daily at 12 noon. House Supplement    oxyCODONE (ROXICODONE) 5 MG immediate release tablet Take 1-2 tablets (5-10 mg total) by mouth every 6 (six) hours as needed for breakthrough pain or severe pain.    pantoprazole (PROTONIX) 40 MG tablet Take 1 tablet (40 mg total) by mouth 2 (two) times daily.    polyethylene glycol (MIRALAX / GLYCOLAX) 17 g packet Take 17 g by mouth daily. (Patient taking differently: Take 17 g by mouth in the morning.)    sertraline (ZOLOFT) 50 MG tablet Take 1 tablet (50 mg total) by mouth at bedtime.    sodium bicarbonate 650 MG tablet Take 650 mg by mouth 3 (three) times daily before meals.    thiamine 100 MG tablet Take 1 tablet (100 mg total) by mouth daily. (Patient taking differently: Take 100 mg by mouth in the morning.)    No facility-administered encounter medications on file as of 06/16/2022.    ALLERGIES:  Allergies  Allergen Reactions   Lisinopril Other (See Comments)    Hyperkalemia / Renal failure     PHYSICAL EXAM:  ECOG PERFORMANCE STATUS: 0 - Asymptomatic *** There were no vitals filed for this visit. There were no vitals filed for this visit. Physical Exam Constitutional:      Appearance: Normal appearance.     Comments: Presents in wheelchair.  HENT:     Head: Normocephalic and atraumatic.     Mouth/Throat:     Mouth: Mucous membranes are moist.  Eyes:     Extraocular Movements: Extraocular movements intact.     Pupils: Pupils are equal, round, and reactive to light.  Cardiovascular:     Rate and Rhythm: Normal rate and regular rhythm.     Pulses: Normal pulses.     Heart sounds: Normal heart sounds.  Pulmonary:  Effort: Pulmonary effort is normal.     Breath sounds: Normal breath sounds.  Abdominal:     General: Bowel sounds are normal.     Palpations: Abdomen is soft.     Tenderness: There is no abdominal tenderness.  Musculoskeletal:        General: No swelling.     Right lower leg: No edema.      Left lower leg: No edema.     Right Lower Extremity: Right leg is amputated above knee.     Left Lower Extremity: Left leg is amputated above knee.  Lymphadenopathy:     Cervical: No cervical adenopathy.  Skin:    General: Skin is warm and dry.  Neurological:     General: No focal deficit present.     Mental Status: He is alert and oriented to person, place, and time.  Psychiatric:        Mood and Affect: Mood normal.        Behavior: Behavior normal.    LABORATORY DATA:  I have reviewed the labs as listed.  CBC    Component Value Date/Time   WBC 9.7 06/09/2022 1026   RBC 4.02 (L) 06/09/2022 1026   HGB 10.9 (L) 06/09/2022 1026   HGB 9.1 (L) 05/18/2021 1110   HCT 34.3 (L) 06/09/2022 1026   HCT 27.5 (L) 05/18/2021 1110   PLT 592 (H) 06/09/2022 1026   PLT 706 (H) 05/18/2021 1110   MCV 85.3 06/09/2022 1026   MCV 84 05/18/2021 1110   MCH 27.1 06/09/2022 1026   MCHC 31.8 06/09/2022 1026   RDW 15.9 (H) 06/09/2022 1026   RDW 13.5 05/18/2021 1110   LYMPHSABS 2.8 06/09/2022 1026   LYMPHSABS 2.3 01/27/2020 0859   MONOABS 0.5 06/09/2022 1026   EOSABS 0.8 (H) 06/09/2022 1026   EOSABS 0.2 01/27/2020 0859   BASOSABS 0.1 06/09/2022 1026   BASOSABS 0.1 01/27/2020 0859      Latest Ref Rng & Units 06/09/2022   10:26 AM 05/17/2022    4:36 AM 05/16/2022    5:36 AM  CMP  Glucose 70 - 99 mg/dL 100  73  95   BUN 8 - 23 mg/dL 34  32  36   Creatinine 0.61 - 1.24 mg/dL 2.31  2.60  2.67   Sodium 135 - 145 mmol/L 141  142  142   Potassium 3.5 - 5.1 mmol/L 4.3  3.9  3.9   Chloride 98 - 111 mmol/L 116  118  120   CO2 22 - 32 mmol/L _0 Calcium 8.9 - 10.3 mg/dL 8.5  7.5  7.2   Total Protein 6.5 - 8.1 g/dL 8.3   7.1   Total Bilirubin 0.3 - 1.2 mg/dL 0.5   0.5   Alkaline Phos 38 - 126 U/L 164   164   AST 15 - 41 U/L 21   35   ALT 0 - 44 U/L 17   21     DIAGNOSTIC IMAGING:  I have independently reviewed the relevant imaging and discussed with the patient.  ASSESSMENT &  PLAN: 1.  Thrombocytosis & neutrophilic leukocytosis -  Intermittent thrombocytosis since at least 2017, with maximum platelets 1009 on 08/30/2016. - Intermittent leukocytosis (primarily neutrophilic) since at least 3734, with highest WBC recorded at 30.6 on 09/11/2016.  (During March/April 2018 - patient was being treated for diabetic foot ulcers prior to his bilateral AKA.) - History of bilateral AKA secondary to diabetic foot ulcers/PAD (2018)   -  Denies recent infections or steroid medications.  No autoimmune/connective tissue disorder.  No splenectomy.  No personal history of cancer, up-to-date on age-appropriate cancer screenings. - He does not take any aspirin, but is on Eliquis due to paroxysmal atrial fibrillation. - Current smoker (3 to 4 cigarettes daily since moving into SNF, previously smoked 0.5 PPD x45 years)*** - No history of DVT or PE. - Hematology work-up (01/12/2022): Negative JAK2 with reflex to CALR and MPL Negative BCR/ABL FISH Elevated ESR 45, elevated CRP 1.0.  Normal ANA and RF.  Normal LDH. Elevated ferritin with normal saturation and TIBC. - CBC (01/12/2022) with normal WBC 9.2 and moderately elevated platelets 541, normal differential - Most recent labs (06/09/2022): Hgb 10.9, WBC 9.7, platelets 592.   - No aquagenic pruritus, B symptoms, or vasomotor symptoms.*** Treated for suspected aspiration pneumonia in December 2023. - PLAN: MPN work-up negative.  Due to chronicity of intermittent thrombocytosis and leukocytosis, differential diagnosis favors reactive process secondary to patient's extensive chronic comorbidities and chronic inflammation. - Repeat CBC with RTC in 4 months.***   2.  Normocytic anemia - Labs from SNF (11/15/2021) show Hgb 10.8/MCV 81.3.  CMP (09/14/2021) showed creatinine 3.68/GFR 19. - Denies history of blood loss or iron deficiency, but received PRBC x2 in August 2022 in the setting of severe anemia (Hgb 6.6)*** - Hemoccult positive in August 2022. -  EGD/colonoscopy (01/13/2021): HSV esophagitis with ulceration, gastritis, duodenitis.  Petechiae in ascending colon and cecum, polyps x3 (tubular adenoma), diverticulosis. - Medications include folic acid, multivitamin, thiamine, vitamin D supplements; he takes Protonix and Eliquis as well. - Has previously followed with [facility] - Hematology work-up (01/12/2022): SPEP negative.  Immunofixation with polyclonal immunoglobulins.  Elevated free light chains with normal ratio in keeping with CKD. Normal B12 with mildly elevated MMA 508 (possibly false positive in the setting of advanced CKD).  Normal copper and folate.  Elevated ferritin 675 with normal TIBC and iron saturation 20%. Creatinine 1.97/GFR 37.  - Most recent labs (06/09/2022): Hgb 10.9, creatinine 2.31/GFR 30, normal B12/MMA.  Ferritin 718, iron saturation 27% with low TIBC 235. - PLAN: Differential diagnosis favors anemia of chronic disease/CKD stage IIIb/IV - No indication for ESA or IV iron at this time. - Repeat CBC, CMP, B12/MMA, and iron panel with RTC in 4 months   3.  Borderline B12 deficiency - Labs from August 2023 show normal B12 with mildly elevated MMA 508 - He has been taking vitamin B12 500 mcg daily since September 2023 *** - Most recent labs (06/09/2022): Normal B12 823, MMA returned to normal at 228. - PLAN: Continue vitamin B12 500 mcg daily.  ***  4.  Tobacco abuse - This patient meets criteria for low-dose CT lung cancer screening (active smoker, 22.5 pack year history) - LDCT chest (01/31/2022): Lung RADS category 2, benign appearance.  Incidental findings of coronary atherosclerosis, aortic atherosclerosis, nephrolithiasis, and emphysema. - PLAN: Next LDCT chest due August 2024   5.  Other history - MAJOR COMORBIDITIES: Diastolic CHF, CKD stage IIIb/IV, insulin-dependent type 2 diabetes mellitus, paroxysmal A-fib (on Eliquis) - ADDITIONAL PMH: S/p bilateral AKA (2018) secondary to diabetic foot ulcer/PAD,  hypertension, GERD, gastritis/duodenitis, HSV esophagitis - SOCIAL: Currently resides at [facility].  Wheelchair dependent.  Currently disabled due to bilateral AKA.  Previously worked in [employer]. - SUBSTANCE: History of daily alcohol use, quit after moving into SNF in 2022.  History of daily cocaine use, quit in 2022 after moving into SNF.  Smoked 0.5 PPD  cigarettes since age 38, cut back to about 3 cigarettes daily at age 25 after moving into SNF.    PLAN SUMMARY:*** >> Labs in 4 months (CBC/D, CMP, ferritin, iron/TIBC, B12, MMA) >> OFFICE visit in 4 months, 1 week after labs >> ***   All questions were answered. The patient knows to call the clinic with any problems, questions or concerns.  Medical decision making: Moderate***  Time spent on visit: I spent *** minutes counseling the patient face to face. The total time spent in the appointment was *** minutes and more than 50% was on counseling.   Harriett Rush, PA-C  ***

## 2022-06-16 ENCOUNTER — Inpatient Hospital Stay: Payer: Medicare (Managed Care) | Attending: Physician Assistant | Admitting: Physician Assistant

## 2022-06-16 ENCOUNTER — Other Ambulatory Visit: Payer: Self-pay

## 2022-06-16 VITALS — BP 109/67 | HR 56 | Temp 97.8°F | Resp 17

## 2022-06-16 DIAGNOSIS — D75839 Thrombocytosis, unspecified: Secondary | ICD-10-CM | POA: Diagnosis not present

## 2022-06-16 DIAGNOSIS — N1832 Chronic kidney disease, stage 3b: Secondary | ICD-10-CM | POA: Diagnosis not present

## 2022-06-16 DIAGNOSIS — D649 Anemia, unspecified: Secondary | ICD-10-CM

## 2022-06-16 DIAGNOSIS — D631 Anemia in chronic kidney disease: Secondary | ICD-10-CM | POA: Diagnosis not present

## 2022-06-16 DIAGNOSIS — E538 Deficiency of other specified B group vitamins: Secondary | ICD-10-CM

## 2022-06-16 MED ORDER — CYANOCOBALAMIN 500 MCG PO TABS: 500.0000 ug | ORAL_TABLET | Freq: Every morning | ORAL | 6 refills | Status: AC

## 2022-06-16 NOTE — Patient Instructions (Signed)
Todd [name] at [facility] **   You were seen today by Tarri Abernethy PA-C for your anemia and elevated white blood cells and platelets.    ELEVATED WHITE BLOOD CELLS & PLATELETS The labs we checked did NOT show any signs of genetic mutation or blood cancer as the cause of your high white blood cells and platelets. These elevated levels are most likely "reactive," meaning that it is your body's response to underlying inflammation and chronic disease.  ANEMIA Your anemia is related to your chronic kidney disease. At this time, your anemia is mild and does not require any treatment.  We will continue to monitor it closely.  LABS: Return in 4 months for repeat labs  MEDICATIONS: CONTINUE taking vitamin B12 500 mcg daily  FOLLOW-UP APPOINTMENT: Office visit in 4 months, 1 week after labs  ** Thank you for trusting me with your healthcare!  I strive to provide all of my patients with quality care at each visit.  If you receive a survey for this visit, I would be so grateful to you for taking the time to provide feedback.  Thank you in advance!  ~ [name]                   Dr. Derek Jack   &   Tarri Abernethy, PA-C   - - - - - - - - - - - - - - - - - -    Thank you for choosing [facility] to provide your oncology and hematology care.  To afford each patient quality time with our provider, please arrive at least 15 minutes before your scheduled appointment time.   If you have a lab appointment with the [facility] please come in thru the Main Entrance and check in at the main information desk.  You need to re-schedule your appointment should you arrive 10 or more minutes late.  We strive to give you quality time with our providers, and arriving late affects you and other patients whose appointments are after yours.  Also, if you no show three or more times for appointments you  may be dismissed from the clinic at the providers discretion.     Again, thank you for choosing [facility].  Our hope is that these requests will decrease the amount of time that you wait before being seen by our physicians.       _____________________________________________________________  Should you have questions after your visit to [facility], please contact our office at [phone] and follow the prompts.  Our office hours are 8:00 a.m. and 4:30 p.m. Monday - Friday.  Please note that voicemails left after 4:00 p.m. may not be returned until the following business day.  We are closed weekends and major holidays.  You do have access to a nurse 24-7, just call the main number to the clinic [phone] and do not press any options, hold on the line and a nurse will answer the phone.    For prescription refill requests, have your pharmacy contact our office and allow 72 hours.

## 2022-06-22 NOTE — Progress Notes (Signed)
PCP:  [name], DO Primary Cardiologist: [name], MD Electrophysiologist: [name], MD   Todd [name] is a 67 y.o. male seen today for [name], MD for post hospital follow up.    Admitted 11/21-11/29 for VT. Picture confounded by concerns for sarcoid and significant co-morbidities. Previously amiodarone had been stopped due to QT prolongation and he was put on mexitil. Pt underwent PCI to LAD and thus ICD consideration was deferred (as had been previously in setting of significant co-morbidities.   Also 12/2 - 12/5 admitted back pain and found to have T11/T12 compression fracture. He was also treated for aspiration pneumonia.  Since discharge from hospital the patient reports doing well overall. He denies dizziness, lightheadedness, chest pain, or syncope.   Past Medical History:  Diagnosis Date   AKI (acute kidney injury) ([place])    Alcohol abuse    Cocaine abuse ([place]) 01/25/2021   Constipated    Diabetes mellitus without complication (HCC)    Diarrhea    Elevated LFTs    Goiter    Gout    Hyperlipidemia    Hypertension    Leukocytosis    Reactive thrombocytosis    Right BKA infection ([place]) 08/2016   Right leg pain    Sepsis due to undetermined organism [facility])    Thyroid disease    Wound infection after surgery 08/2016    Current Outpatient Medications  Medication Instructions   acetaminophen (TYLENOL) 650 mg, Oral, Every 6 hours PRN   allopurinol (ZYLOPRIM) 100 mg, Oral, Daily   apixaban (ELIQUIS) 5 mg, Oral, 2 times daily   Ascorbic Acid 500 mg, Oral, Daily   atorvastatin (LIPITOR) 80 mg, Oral, Daily   calcium carbonate (TUMS - DOSED IN MG ELEMENTAL CALCIUM) 1,000 mg, Oral, 3 times daily before meals   clopidogrel (PLAVIX) 75 mg, Oral, Daily with breakfast   cyanocobalamin (V-R VITAMIN B-12) 500 mcg, Oral, Every morning   folic acid (FOLVITE) 1 mg, Oral, Every morning   Glucagon Emergency 1 mg, Intramuscular, As needed    hydrALAZINE (APRESOLINE) 25 mg, Oral, 3 times daily   insulin lispro (ADMELOG SOLOSTAR) 16 Units, Subcutaneous, Daily at bedtime   insulin lispro (HUMALOG) 3-9 Units, Subcutaneous, 3 times daily, Sliding scale 90-150-3 units, 151-200- 4units, 200-250 5units, 251-300 6units, 301-350 7units, 351-400 8units,401-450 9units   isosorbide mononitrate (IMDUR) 60 mg, Oral, Daily   levothyroxine (SYNTHROID) 100 mcg, Oral, Daily before breakfast   lidocaine (LIDODERM) 5 % 1 patch, Transdermal, Every 24 hours, Remove & Discard patch within 12 hours or as directed by MD   metoprolol succinate (TOPROL-XL) 100 mg, Oral, Daily   mexiletine (MEXITIL) 150 mg, Oral, Every 8 hours   mirtazapine (REMERON) 7.5 mg, Oral, Daily at bedtime   Multiple Vitamin (MULTIVITAMIN WITH MINERALS) TABS tablet 1 tablet, Oral, Daily   Nutritional Supplements (NUTRITIONAL DRINK) LIQD 240 mLs, Oral, Daily, House Supplement   oxyCODONE (ROXICODONE) 5-10 mg, Oral, Every 6 hours PRN   pantoprazole (PROTONIX) 40 mg, Oral, 2 times daily   polyethylene glycol (MIRALAX / GLYCOLAX) 17 g, Oral, Daily   sertraline (ZOLOFT) 50 mg, Oral, Daily at bedtime   sodium bicarbonate 650 mg, Oral, 3 times daily before meals   thiamine (VITAMIN B1) 100 mg, Oral, Daily   Vitamin D3 2,000 Units, Oral, Every morning    Physical Exam: Vitals:   06/24/22 1041  BP: 118/62  Pulse: 60  SpO2: 98%  Weight: 147 lb (66.7 kg)  Height: 5' (1.524 m)  GEN- The patient is well appearing, alert and oriented x 3 today.   HEENT: normocephalic, atraumatic Lungs- Clear to ausculation bilaterally, normal work of breathing. Heart- Regular rate and rhythm, no murmurs, rubs or gallops Extremities- Bilateral BKA Skin- warm and dry, no rash or lesion Psych- euthymic mood, full affect  EKG is not ordered today. EKG from 05/14/2022 reviewed which showed NSR at 86 bpm  Additional studies reviewed include: Previous EP notes.   Assessment and Plan:  1. Syncope  and collapse 2. Ventricular tachycardia Continue mexiletine 150 mg q 8 hours There has chronically been a concern for sarcoidosis by cMRI.  Not PET candidate on insulin.  ICD consideration deferred in setting of successful PCI.  Will have follow up with Dr. [name] to further discuss best approach going forward.   3. Atrial fibrillation, paroxsysmal Maintaining NSR  Continue eliquis for stroke prophylaxis  4. CAD s/p PCI 04/2022 Denies s/s ischemia S/p PCI to LAD 1127/2023  Follow up with Dr. [name] in in 3 months  Terique Kawabata, PA-C  06/24/22 10:48 AM

## 2022-06-24 ENCOUNTER — Encounter: Payer: Self-pay | Admitting: Student

## 2022-06-24 ENCOUNTER — Ambulatory Visit: Payer: Medicare (Managed Care) | Attending: Student | Admitting: Student

## 2022-06-24 VITALS — BP 118/62 | HR 60 | Ht 60.0 in | Wt 147.0 lb

## 2022-06-24 DIAGNOSIS — I2510 Atherosclerotic heart disease of native coronary artery without angina pectoris: Secondary | ICD-10-CM

## 2022-06-24 DIAGNOSIS — I480 Paroxysmal atrial fibrillation: Secondary | ICD-10-CM | POA: Diagnosis not present

## 2022-06-24 DIAGNOSIS — I472 Ventricular tachycardia, unspecified: Secondary | ICD-10-CM | POA: Diagnosis not present

## 2022-06-24 DIAGNOSIS — I482 Chronic atrial fibrillation, unspecified: Secondary | ICD-10-CM | POA: Diagnosis not present

## 2022-06-24 NOTE — Patient Instructions (Signed)
Medication Instructions:  Your physician recommends that you continue on your current medications as directed. Please refer to the Current Medication list given to you today.  *If you need a refill on your cardiac medications before your next appointment, please call your pharmacy*   Lab Work: None If you have labs (blood work) drawn today and your tests are completely normal, you will receive your results only by: [place] (if you have MyChart) OR A paper copy in the mail If you have any lab test that is abnormal or we need to change your treatment, we will call you to review the results.   Follow-Up: At [facility], you and your health needs are our priority.  As part of our continuing mission to provide you with exceptional heart care, we have created designated Provider Care Teams.  These Care Teams include your primary Cardiologist (physician) and Advanced Practice Providers (APPs -  Physician Assistants and Nurse Practitioners) who all work together to provide you with the care you need, when you need it.  Your next appointment:   3 month(s)  Provider:   Lars Mage, MD

## 2022-08-11 ENCOUNTER — Encounter: Payer: Self-pay | Admitting: Radiology

## 2022-08-19 ENCOUNTER — Ambulatory Visit: Payer: Medicare (Managed Care) | Admitting: Internal Medicine

## 2022-09-05 ENCOUNTER — Encounter: Payer: Self-pay | Admitting: *Deleted

## 2022-09-05 ENCOUNTER — Telehealth: Payer: Self-pay | Admitting: *Deleted

## 2022-09-05 ENCOUNTER — Ambulatory Visit: Payer: Medicare (Managed Care) | Attending: Internal Medicine | Admitting: Internal Medicine

## 2022-09-05 VITALS — BP 142/80 | HR 66

## 2022-09-05 DIAGNOSIS — I70208 Unspecified atherosclerosis of native arteries of extremities, other extremity: Secondary | ICD-10-CM

## 2022-09-05 DIAGNOSIS — I2510 Atherosclerotic heart disease of native coronary artery without angina pectoris: Secondary | ICD-10-CM

## 2022-09-05 DIAGNOSIS — I472 Ventricular tachycardia, unspecified: Secondary | ICD-10-CM

## 2022-09-05 NOTE — Telephone Encounter (Signed)
[place], spoke with nurse Linton Rump LPN, and advised of Cardiac Sarcoidosis PET Scan that has been ordered by Dr. Dellia Cloud. Advise that food diary log along with patient instructions will be faxed to [phone]. Aware that patient would be contacted with appointment.

## 2022-09-05 NOTE — Progress Notes (Signed)
Cardiology Office Note  Date: 09/05/2022   ID: Todd [name], DOB Jan 30, 1956, MRN [id number]  PCP:  [name], DO  Cardiologist:  [name], MD Electrophysiologist:  [name], MD   Reason for Office Visit: Follow-up of CAD and V. tach   History of Present Illness: Todd [name] is a 67 y.o. male known to have CAD manifested by V. tach in 04/2022 s/p LAD PCI with LVEF 45 to 50%, sustained V. tach secondary to cocaine use in 04/2021 (followed by EP, not a candidate for ICD due to polysubstance abuse), prolonged QT interval secondary to amiodarone use (currently on mexiletine), concern for sarcoidosis on documentation (per cardiac MRI), paroxysmal A-fib on Eliquis, HTN, IDDM, HLD, bilateral lower extremity amputation secondary to poorly controlled DM2, CKD presented to the cardiology clinic for follow-up visit.  Patient had sustained V. tach in 04/2021 with LVEF 45 to 50% but LHC was deferred due to CKD. V. tach was deemed to be from cocaine use. He was again readmitted 04/2022 with a different reason but was found to be in V. tach, underwent LHC and showed severe stenosis of mid LAD, underwent PCI. He was seen by EP for ICD candidacy but deferred due to polysubstance abuse. Patient is wheelchair-bound, currently lives in a facility. Denies any symptoms of chest pain, DOE, dizziness, lightheadedness, syncope, palpitations. He stated he stopped using cocaine couple of years ago.  Past Medical History:  Diagnosis Date   AKI (acute kidney injury) ([place])    Alcohol abuse    Cocaine abuse ([place]) 01/25/2021   Constipated    Diabetes mellitus without complication (HCC)    Diarrhea    Elevated LFTs    Goiter    Gout    Hyperlipidemia    Hypertension    Leukocytosis    Reactive thrombocytosis    Right BKA infection ([place]) 08/2016   Right leg pain    Sepsis due to undetermined organism [facility])    Thyroid disease    Wound infection after surgery 08/2016     Past Surgical History:  Procedure Laterality Date   ABDOMINAL AORTOGRAM N/A 08/11/2016   Procedure: Abdominal Aortogram;  Surgeon: Waynetta Sandy, MD;  Location: [place] [facility];  Service: Cardiovascular;  Laterality: N/A;   ABDOMINAL AORTOGRAM W/LOWER EXTREMITY N/A 08/15/2016   Procedure: Abdominal Aortogram w/Lower Extremity;  Surgeon: Elam Dutch, MD;  Location: [place] [facility];  Service: Cardiovascular;  Laterality: N/A;   AMPUTATION Right 08/17/2016   Procedure: RIGHT BELOW KNEE AMPUTATION;  Surgeon: Elam Dutch, MD;  Location: [place];  Service: Vascular;  Laterality: Right;   AMPUTATION Right 09/12/2016   Procedure: AMPUTATION ABOVE KNEE;  Surgeon: Newt Minion, MD;  Location: [place];  Service: Orthopedics;  Laterality: Right;   AMPUTATION Left 08/12/2016   Procedure: LEFT BELOW KNEE AMPUTATION;  Surgeon: Newt Minion, MD;  Location: [place];  Service: Orthopedics;  Laterality: Left;   AMPUTATION Left 11/01/2017   Procedure: LEFT ABOVE KNEE AMPUTATION;  Surgeon: Newt Minion, MD;  Location: [place];  Service: Orthopedics;  Laterality: Left;   APPLICATION OF WOUND VAC Right 09/12/2016   Procedure: APPLICATION OF WOUND VAC ABOVE KNEE;  Surgeon: Newt Minion, MD;  Location: [place];  Service: Orthopedics;  Laterality: Right;   BIOPSY  01/13/2021   Procedure: BIOPSY;  Surgeon: Rogene Houston, MD;  [facility];  Service: Endoscopy;;  esophageal   COLONOSCOPY WITH PROPOFOL N/A 01/13/2021  Procedure: COLONOSCOPY WITH PROPOFOL;  Surgeon: Rogene Houston, MD;  [facility];  Service: Endoscopy;  Laterality: N/A;   CORONARY STENT INTERVENTION N/A 05/09/2022   Procedure: CORONARY STENT INTERVENTION;  Surgeon: Nelva Bush, MD;  Location: [place] [facility];  Service: Cardiovascular;  Laterality: N/A;   ESOPHAGOGASTRODUODENOSCOPY (EGD) WITH PROPOFOL N/A 01/13/2021   Procedure: ESOPHAGOGASTRODUODENOSCOPY (EGD) WITH PROPOFOL;  Surgeon: Rogene Houston, MD;   [facility];  Service: Endoscopy;  Laterality: N/A;   INTRAVASCULAR PRESSURE WIRE/FFR STUDY N/A 05/09/2022   Procedure: INTRAVASCULAR PRESSURE WIRE/FFR STUDY;  Surgeon: Nelva Bush, MD;  Location: [place] [facility];  Service: Cardiovascular;  Laterality: N/A;   LEFT HEART CATH AND CORONARY ANGIOGRAPHY N/A 05/09/2022   Procedure: LEFT HEART CATH AND CORONARY ANGIOGRAPHY;  Surgeon: Nelva Bush, MD;  Location: [place] [facility];  Service: Cardiovascular;  Laterality: N/A;   LOWER EXTREMITY ANGIOGRAPHY Bilateral 08/11/2016   Procedure: Lower Extremity Angiography;  Surgeon: Waynetta Sandy, MD;  Location: [place] [facility];  Service: Cardiovascular;  Laterality: Bilateral;   PERIPHERAL VASCULAR BALLOON ANGIOPLASTY Left 08/11/2016   Procedure: Peripheral Vascular Balloon Angioplasty;  Surgeon: Waynetta Sandy, MD;  Location: [place] [facility];  Service: Cardiovascular;  Laterality: Left;  SFA   POLYPECTOMY  01/13/2021   Procedure: POLYPECTOMY;  Surgeon: Rogene Houston, MD;  [facility];  Service: Endoscopy;;   THYROID SURGERY      Current Outpatient Medications  Medication Sig Dispense Refill   acetaminophen (TYLENOL) 325 MG tablet Take 2 tablets (650 mg total) by mouth every 6 (six) hours as needed for mild pain (or Fever >/= 101). (Patient taking differently: Take 650 mg by mouth every 6 (six) hours as needed for moderate pain.)     allopurinol (ZYLOPRIM) 100 MG tablet Take 1 tablet (100 mg total) by mouth daily. (Patient taking differently: Take 100 mg by mouth in the morning.) 30 tablet 1   apixaban (ELIQUIS) 5 MG TABS tablet Take 1 tablet (5 mg total) by mouth 2 (two) times daily. 60 tablet 5   Ascorbic Acid 500 MG CAPS Take 500 mg by mouth daily.     atorvastatin (LIPITOR) 80 MG tablet Take 1 tablet (80 mg total) by mouth daily. 90 tablet 3   calcium carbonate (TUMS - DOSED IN MG ELEMENTAL CALCIUM) 500 MG chewable tablet Chew 1,000 mg by mouth  3 (three) times daily before meals.     Cholecalciferol (VITAMIN D3) 50 MCG (2000 UT) capsule Take 2,000 Units by mouth in the morning.     clopidogrel (PLAVIX) 75 MG tablet Take 1 tablet (75 mg total) by mouth daily with breakfast. 90 tablet 3   cyanocobalamin (V-R VITAMIN B-12) 500 MCG tablet Take 1 tablet (500 mcg total) by mouth in the morning. 30 tablet 6   folic acid (FOLVITE) 1 MG tablet Take 1 mg by mouth in the morning.     Glucagon, rDNA, (GLUCAGON EMERGENCY) 1 MG KIT Inject 1 mg into the muscle as needed (hypoglycemia).     hydrALAZINE (APRESOLINE) 25 MG tablet Take 1 tablet (25 mg total) by mouth 3 (three) times daily. 270 tablet 3   insulin lispro (ADMELOG SOLOSTAR) 100 UNIT/ML KwikPen Inject 16 Units into the skin at bedtime.     insulin lispro (HUMALOG) 100 UNIT/ML KwikPen Inject 3-9 Units into the skin 3 (three) times daily. Sliding scale 90-150-3 units, 151-200- 4units, 200-250 5units, 251-300 6units, 301-350 7units, 351-400 8units,401-450 9units (Patient taking differently:  Inject 0-6 Units into the skin See admin instructions. Inject 0-10 units 3 times daily before meals and at bedtime per sliding scale: CBG < 150 : 0 units CBG 151-200 : 1 units CBG 201-250 : 2 units CBG 251-300 : 3 units CBG 301-350 : 4 units CBG 351-400 : 5 units (over 400 give 10 units and call provider) CBG 401-500 : 6 units) 15 mL 3   isosorbide mononitrate (IMDUR) 60 MG 24 hr tablet Take 1 tablet (60 mg total) by mouth daily. 90 tablet 3   levothyroxine (SYNTHROID) 100 MCG tablet Take 100 mcg by mouth daily before breakfast.     lidocaine (LIDODERM) 5 % Place 1 patch onto the skin daily. Remove & Discard patch within 12 hours or as directed by MD 30 patch 0   metoprolol succinate (TOPROL-XL) 100 MG 24 hr tablet Take 1 tablet (100 mg total) by mouth daily. 90 tablet 3   mexiletine (MEXITIL) 150 MG capsule Take 1 capsule (150 mg total) by mouth every 8 (eight) hours.     mirtazapine (REMERON) 7.5 MG tablet  Take 7.5 mg by mouth at bedtime.     Multiple Vitamin (MULTIVITAMIN WITH MINERALS) TABS tablet Take 1 tablet by mouth daily. (Patient taking differently: Take 1 tablet by mouth in the morning.)     Nutritional Supplements (NUTRITIONAL DRINK) LIQD Take 240 mLs by mouth daily at 12 noon. House Supplement     oxyCODONE (ROXICODONE) 5 MG immediate release tablet Take 1-2 tablets (5-10 mg total) by mouth every 6 (six) hours as needed for breakthrough pain or severe pain. 10 tablet 0   pantoprazole (PROTONIX) 40 MG tablet Take 1 tablet (40 mg total) by mouth 2 (two) times daily.     polyethylene glycol (MIRALAX / GLYCOLAX) 17 g packet Take 17 g by mouth daily. (Patient taking differently: Take 17 g by mouth in the morning.) 14 each 0   sertraline (ZOLOFT) 50 MG tablet Take 1 tablet (50 mg total) by mouth at bedtime. 30 tablet 1   sodium bicarbonate 650 MG tablet Take 650 mg by mouth 3 (three) times daily before meals.     thiamine 100 MG tablet Take 1 tablet (100 mg total) by mouth daily. (Patient taking differently: Take 100 mg by mouth in the morning.) 30 tablet 3   No current facility-administered medications for this visit.   Allergies:  Lisinopril   Social History: The patient  reports that he has been smoking cigarettes. He has a 11.25 pack-year smoking history. He has never been exposed to tobacco smoke. He has never used smokeless tobacco. He reports that he does not currently use alcohol after a past usage of about 2.0 standard drinks of alcohol per week. He reports that he does not currently use drugs after having used the following drugs: Cocaine.   Family History: The patient's family history includes Diabetes in his maternal aunt and maternal uncle; Heart disease in his mother; Pneumonia in his father.   ROS:  Please see the history of present illness. Otherwise, complete review of systems is positive for none.  All other systems are reviewed and negative.   Physical Exam: VS:  There were  no vitals taken for this visit., BMI There is no height or weight on file to calculate BMI.  Wt Readings from Last 3 Encounters:  06/24/22 147 lb (66.7 kg)  05/14/22 149 lb 14.6 oz (68 kg)  05/09/22 149 lb 0.5 oz (67.6 kg)    General: Patient appears  comfortable at rest. HEENT: Conjunctiva and lids normal, oropharynx clear with moist mucosa. Neck: Supple, no elevated JVP or carotid bruits, no thyromegaly. Lungs: Clear to auscultation, nonlabored breathing at rest. Cardiac: Regular rate and rhythm, no S3 or significant systolic murmur, no pericardial rub. Abdomen: Soft, nontender, no hepatomegaly, bowel sounds present, no guarding or rebound. Extremities: No pitting edema, bilateral lower EXTR amputation present, right radial pulse absent Skin: Warm and dry. Musculoskeletal: No kyphosis. Neuropsychiatric: Alert and oriented x3, affect grossly appropriate.  ECG: NSR in 05/2022  Recent Labwork: 05/03/2022: TSH 4.318 05/17/2022: Magnesium 1.8 06/09/2022: ALT 17; AST 21; BUN 34; Creatinine, Ser 2.31; Hemoglobin 10.9; Platelets 592; Potassium 4.3; Sodium 141     Component Value Date/Time   CHOL 47 05/06/2022 0416   CHOL 59 (L) 09/22/2020 0923   TRIG 65 05/06/2022 0416   HDL 22 (L) 05/06/2022 0416   HDL 25 (L) 09/22/2020 0923   CHOLHDL 2.1 05/06/2022 0416   VLDL 13 05/06/2022 0416   LDLCALC 12 05/06/2022 0416   LDLCALC 19 09/22/2020 0923    Other Studies Reviewed Today:   Assessment and Plan: Patient is a 67 year old M known to have CAD manifested by V. tach in 04/2022 s/p LAD PCI with LVEF 45 to 50%, sustained V. tach secondary to cocaine use in 04/2021 (followed by EP, not a candidate for ICD due to polysubstance abuse), prolonged QT interval secondary to amiodarone use (currently on mexiletine), concern for sarcoidosis on documentation (per cardiac MRI), paroxysmal A-fib on Eliquis, HTN, IDDM, HLD, bilateral lower extremity amputation secondary to poorly controlled DM2, CKD  presented to the cardiology clinic for follow-up visit.  # CAD manifested by V. tach in 04/2022 s/p LAD PCI with LVEF 45 to 50%, currently angina free # Cardiomyopathy with LVEF 45 to 50%, currently compensated -Continue Plavix 75 mg once daily with Eliquis 5 mg twice daily for total duration of 1 year. Plavix will be discontinued in 05/2023. -Continue atorvastatin 80 mg nightly -Continue metoprolol succinate 100 mg once daily -Not on ACE/ARB due to CKD -ER precautions for chest pain  # History of V. tach -Patient had sustained V. tach in 04/2021 likely from cocaine use/ischemia but LHC was deferred due to CKD. Cardiac MRI was performed in 07/2021 that showed LGE in the basal septum, basal anterior wall, mid anteroseptal wall and mid inferior wall in the mid myocardium. Due to VT history, cardiac PET was indicated to rule out sarcoidosis. He had a recurrent sustained V. tach episode in 04/2022 which was when [facility] was performed and showed severe stenosis of mid LAD and underwent PCI. -V. tach most likely secondary to cocaine use and ischemia however cardiac sarcoidosis cannot be completely ruled out. Will obtain cardiac PET. If cardiac PET is negative for sarcoidosis, I do not think he will benefit from ICD. -Continue mexiletine 150 mg every 8 hours until cardiac PET is resulted (off amiodarone due to prolonged QTc interval in the past).  # Paroxysmal A-fib -Continue metoprolol succinate 100 mg once daily -Continue Eliquis 5 mg twice daily  # Right upper extremity PAD -Patient had absent R radial pulse after cardiac cath in 04/2022. Patient has absent R radial pulse with intact sensory and motor strength today in the clinic. Ultrasound arterial Doppler of the right upper extremity showed turbulent waveforms to proximal R SCA suggestive of more proximal obstruction. R radial artery was patent with some degree of calcification dampened waveforms compared to our ulnar artery. No weakness or numbness in  the  right upper extremity. Has 5/5 motor strength in the R UE.  Will monitor for any new development of symptoms.   # Nicotine abuse Plan smoking cessation counseling provided. Currently smokes 3 cigarettes/day. Smoking cessation instruction/counseling given:  counseled patient on the dangers of tobacco use, advised patient to stop smoking, and reviewed strategies to maximize success   I have spent a total of 33 minutes with patient reviewing chart, EKGs, labs and examining patient as well as establishing an assessment and plan that was discussed with the patient.  > 50% of time was spent in direct patient care.      Medication Adjustments/Labs and Tests Ordered: Current medicines are reviewed at length with the patient today.  Concerns regarding medicines are outlined above.   Tests Ordered: No orders of the defined types were placed in this encounter.   Medication Changes: No orders of the defined types were placed in this encounter.   Disposition:  Follow up  6 months  Signed Chayla Shands Fidel Levy, MD, 09/05/2022 9:11 AM    [facility] [place], [place], [place] [address]

## 2022-09-05 NOTE — Telephone Encounter (Deleted)
How to Prepare for Your Cardiac PET/CT Stress Test:  1. Please do not take these medications before your test: isosorbide mononitrate, metoprolol succinate  Medications that may interfere with the cardiac pharmacological stress agent (ex. nitrates - including erectile dysfunction medications, isosorbide mononitrate, tamulosin or beta-blockers) the day of the exam. (Erectile dysfunction medication should be held for at least 72 hrs prior to test) Theophylline containing medications for 12 hours. Dipyridamole 48 hours prior to the test. Your remaining medications may be taken with water.  2. Nothing to eat or drink, except water, 3 hours prior to arrival time.   NO caffeine/decaffeinated products, or chocolate 12 hours prior to arrival.  3. NO perfume, cologne or lotion  4. Total time is 1 to 2 hours; you may want to bring reading material for the waiting time.  5. Please report to Radiology at the [facility] Main Entrance 30 minutes early for your test.  [place], [place] [id number]  Diabetic Preparation:  Hold oral medications. You may take NPH and Lantus insulin. Do not take Humalog or Humulin R (Regular Insulin) the day of your test. Humalog Check blood sugars prior to leaving the house. If able to eat breakfast prior to 3 hour fasting, you may take all medications, including your insulin, Do not worry if you miss your breakfast dose of insulin - start at your next meal.  IF YOU THINK YOU MAY BE PREGNANT, OR ARE NURSING PLEASE INFORM THE TECHNOLOGIST.  In preparation for your appointment, medication and supplies will be purchased.  Appointment availability is limited, so if you need to cancel or reschedule, please call the Radiology Department at [phone]  24 hours in advance to avoid a cancellation fee of $100.00  What to Expect After you Arrive:  Once you arrive and check in for your appointment, you will be taken to a preparation room within the  Radiology Department.  A technologist or Nurse will obtain your medical history, verify that you are correctly prepped for the exam, and explain the procedure.  Afterwards,  an IV will be started in your arm and electrodes will be placed on your skin for EKG monitoring during the stress portion of the exam. Then you will be escorted to the PET/CT scanner.  There, staff will get you positioned on the scanner and obtain a blood pressure and EKG.  During the exam, you will continue to be connected to the EKG and blood pressure machines.  A small, safe amount of a radioactive tracer will be injected in your IV to obtain a series of pictures of your heart along with an injection of a stress agent.    After your Exam:  It is recommended that you eat a meal and drink a caffeinated beverage to counter act any effects of the stress agent.  Drink plenty of fluids for the remainder of the day and urinate frequently for the first couple of hours after the exam.  Your doctor will inform you of your test results within 7-10 business days.  For questions about your test or how to prepare for your test, please call: Marchia Bond, Cardiac Imaging Nurse Navigator  Gordy Clement, Cardiac Imaging Nurse Navigator Office: [phone]

## 2022-09-05 NOTE — Telephone Encounter (Signed)
Sister Selinda Eon informed and verbalized understanding of plan.

## 2022-09-05 NOTE — Telephone Encounter (Signed)
Strict diet prep information created under letters.

## 2022-09-05 NOTE — Telephone Encounter (Signed)
Per Mallipeddi: Can you order Cardiac PET on him please. I edited my note to reflect this Cardiac PET to r/o cardiac sarcoidosis

## 2022-09-05 NOTE — Patient Instructions (Addendum)

## 2022-09-12 ENCOUNTER — Ambulatory Visit: Payer: Medicare (Managed Care) | Admitting: Nurse Practitioner

## 2022-09-12 DIAGNOSIS — E559 Vitamin D deficiency, unspecified: Secondary | ICD-10-CM

## 2022-09-12 DIAGNOSIS — E782 Mixed hyperlipidemia: Secondary | ICD-10-CM

## 2022-09-12 DIAGNOSIS — E039 Hypothyroidism, unspecified: Secondary | ICD-10-CM

## 2022-09-12 DIAGNOSIS — I10 Essential (primary) hypertension: Secondary | ICD-10-CM

## 2022-09-12 DIAGNOSIS — Z794 Long term (current) use of insulin: Secondary | ICD-10-CM

## 2022-09-20 ENCOUNTER — Telehealth: Payer: Self-pay | Admitting: Internal Medicine

## 2022-09-20 NOTE — Telephone Encounter (Signed)
PERCERT VERIFICATION FOR NM PET CT MYOCARDIAL SARCOIDOSIS  10/04/2022 @ [facility].

## 2022-09-26 ENCOUNTER — Telehealth (HOSPITAL_COMMUNITY): Payer: Self-pay | Admitting: *Deleted

## 2022-09-26 NOTE — Telephone Encounter (Signed)
Called [place] to ensure the staff were aware of the Cardiac PET sarcoid prep instructions. Left message with staff involved with care to call back.  Larey Brick RN Navigator Cardiac Imaging [facility] Heart and Vascular Services [phone] Office [phone] Cell

## 2022-09-27 ENCOUNTER — Telehealth (HOSPITAL_COMMUNITY): Payer: Self-pay | Admitting: *Deleted

## 2022-09-27 NOTE — Telephone Encounter (Signed)
Osvaldo Human from [facility] returning call regarding patient's prep. Verbalized understanding of instructions.  Will call back if instructions and food diary need to be re-faxed.  Larey Brick RN Navigator Cardiac Imaging [facility] Heart and Vascular Services [phone] Office [phone] Cell

## 2022-09-30 ENCOUNTER — Telehealth (HOSPITAL_COMMUNITY): Payer: Self-pay | Admitting: *Deleted

## 2022-09-30 NOTE — Telephone Encounter (Signed)
Called the  nursing facility where the patient is currently residing.  I reviewed instructions with nursing staff and patient regarding his diet prep for his cardiac PET sarcoid testing. Both the facility and patient verbalized understanding.  Larey Brick RN Navigator Cardiac Imaging [facility] Heart and Vascular Services [phone] Office [phone] Cell

## 2022-10-04 ENCOUNTER — Ambulatory Visit (HOSPITAL_COMMUNITY)
Admission: RE | Admit: 2022-10-04 | Discharge: 2022-10-04 | Disposition: A | Discharge status: Home / Self Care | Payer: Medicare (Managed Care) | Source: Ambulatory Visit | Attending: Internal Medicine | Admitting: Internal Medicine

## 2022-10-04 ENCOUNTER — Encounter (HOSPITAL_COMMUNITY): Payer: Self-pay

## 2022-10-04 DIAGNOSIS — I2510 Atherosclerotic heart disease of native coronary artery without angina pectoris: Secondary | ICD-10-CM | POA: Insufficient documentation

## 2022-10-04 DIAGNOSIS — I472 Ventricular tachycardia, unspecified: Secondary | ICD-10-CM | POA: Insufficient documentation

## 2022-10-04 NOTE — Progress Notes (Signed)
Patient arrived for his cardiac PET sarcoid test having drank a Dr. Reino Kent within 24 hours of his study.  He along with the nursing home representative was re-educated on the diet preparations. Patient verbalized understanding and is agreeable to being re-scheduled.  Larey Brick RN Navigator Cardiac Imaging [facility] Heart and Vascular Services [phone] Office [phone] Cell

## 2022-10-05 ENCOUNTER — Ambulatory Visit: Payer: Medicare (Managed Care) | Admitting: Nurse Practitioner

## 2022-10-05 ENCOUNTER — Other Ambulatory Visit: Payer: Self-pay | Admitting: Nurse Practitioner

## 2022-10-05 DIAGNOSIS — F17200 Nicotine dependence, unspecified, uncomplicated: Secondary | ICD-10-CM

## 2022-10-05 DIAGNOSIS — E1121 Type 2 diabetes mellitus with diabetic nephropathy: Secondary | ICD-10-CM

## 2022-10-05 DIAGNOSIS — E039 Hypothyroidism, unspecified: Secondary | ICD-10-CM

## 2022-10-05 DIAGNOSIS — I10 Essential (primary) hypertension: Secondary | ICD-10-CM

## 2022-10-05 DIAGNOSIS — E559 Vitamin D deficiency, unspecified: Secondary | ICD-10-CM

## 2022-10-05 DIAGNOSIS — Z794 Long term (current) use of insulin: Secondary | ICD-10-CM

## 2022-10-05 DIAGNOSIS — E782 Mixed hyperlipidemia: Secondary | ICD-10-CM

## 2022-10-06 LAB — LIPID PANEL
Cholesterol: 89 (ref 0–200)
HDL: 28 — AB (ref 35–70)
LDL Cholesterol: 20
LDl/HDL Ratio: 3.1
Triglycerides: 168 — AB (ref 40–160)

## 2022-10-06 LAB — BASIC METABOLIC PANEL
BUN: 26 — AB (ref 4–21)
CO2: 21 (ref 13–22)
Chloride: 119 — AB (ref 99–108)
Creatinine: 2.2 — AB (ref 0.6–1.3)
Glucose: 74
Potassium: 4.7 mEq/L (ref 3.5–5.1)
Sodium: 145 (ref 137–147)

## 2022-10-06 LAB — HEPATIC FUNCTION PANEL
ALT: 18 U/L (ref 10–40)
AST: 28 (ref 14–40)
Alkaline Phosphatase: 284 — AB (ref 25–125)
Bilirubin, Total: 0.2

## 2022-10-06 LAB — COMPREHENSIVE METABOLIC PANEL
Albumin: 3.3 — AB (ref 3.5–5.0)
Calcium: 6.7 — AB (ref 8.7–10.7)

## 2022-10-06 LAB — TSH: TSH: 0.03 — AB (ref 0.41–5.90)

## 2022-10-13 NOTE — Progress Notes (Deleted)
  Electrophysiology Office Follow up Visit Note:    Date:  10/13/2022   ID:  Todd [name], DOB May 30, 1956, MRN [id number]  PCP:  Sherol Dade, DO  [facility] Cardiologist:  Marjo Bicker, MD  [facility] Electrophysiologist:  Lanier Prude, MD    Interval History:    Todd [name] is a 67 y.o. male who presents for a follow up visit.   Last seen 07/09/2021 for syncope and VT. He also has paroxysmal AF, HTN, DM, HLD, LE amputation, poorly controlled DM, CKD. He has previously been evaluated for possible ICD but was deemed not a candidate due to substance abuse and reversible causes of his arrhythmias (LAD occlusion) There have been concerns in the past for cardiac sarcoid based on cMR.   His amiodarone was previously stopped due to a prolonged QT and he was started on mexiletine.   Today, ***     Past medical, surgical, social and family history were reviewed.  ROS:   Please see the history of present illness.    All other systems reviewed and are negative.  EKGs/Labs/Other Studies Reviewed:    The following studies were reviewed today:   Physical Exam:    VS:  There were no vitals taken for this visit.    Wt Readings from Last 3 Encounters:  06/24/22 147 lb (66.7 kg)  05/14/22 149 lb 14.6 oz (68 kg)  05/09/22 149 lb 0.5 oz (67.6 kg)     GEN: *** Well nourished, well developed in no acute distress CARDIAC: ***RRR, no murmurs, rubs, gallops RESPIRATORY:  Clear to auscultation without rales, wheezing or rhonchi       ASSESSMENT:    1. V-tach (HCC)   2. Coronary artery disease involving native heart without angina pectoris, unspecified vessel or lesion type   3. PAF (paroxysmal atrial fibrillation) (HCC)    PLAN:    In order of problems listed above:   #HFrEF #ICM NYHA II. Warm and dry. Follows with Dr Jenene Slicker.   #VT Unclear exact cause. Prior episodes in setting of cocaine use and with a critical LAD stenosis that has now  been stented.  Electrically quiescent on mexiletine, while abstaining from cocaine and after PCI to the LAD.  Given his prior MRI and ? Re: sarcoid, I would like to repeat his cMR. Sarcoid PET study not feasible with insulin therapy.   Follow up in 3 months after cMR.     Signed, Steffanie Dunn, MD, [facility], [facility] 10/13/2022 9:16 PM    Electrophysiology [facility]

## 2022-10-14 ENCOUNTER — Encounter: Payer: Self-pay | Admitting: Cardiology

## 2022-10-14 ENCOUNTER — Ambulatory Visit: Payer: Medicare (Managed Care) | Attending: Cardiology | Admitting: Cardiology

## 2022-10-14 VITALS — BP 130/80 | HR 64

## 2022-10-14 DIAGNOSIS — I472 Ventricular tachycardia, unspecified: Secondary | ICD-10-CM

## 2022-10-14 DIAGNOSIS — I480 Paroxysmal atrial fibrillation: Secondary | ICD-10-CM

## 2022-10-14 DIAGNOSIS — I2510 Atherosclerotic heart disease of native coronary artery without angina pectoris: Secondary | ICD-10-CM | POA: Diagnosis not present

## 2022-10-14 LAB — CBC AND DIFFERENTIAL
HCT: 35 — AB (ref 41–53)
Hemoglobin: 11.2 — AB (ref 13.5–17.5)

## 2022-10-14 LAB — COMPREHENSIVE METABOLIC PANEL
Calcium: 6.7 — AB (ref 8.7–10.7)
eGFR: 28

## 2022-10-14 LAB — BASIC METABOLIC PANEL
BUN: 27 — AB (ref 4–21)
Creatinine: 2.5 — AB (ref 0.6–1.3)
Glucose: 117

## 2022-10-14 NOTE — Patient Instructions (Signed)
Medication Instructions:  Your physician recommends that you continue on your current medications as directed. Please refer to the Current Medication list given to you today.  *If you need a refill on your cardiac medications before your next appointment, please call your pharmacy*   Lab Work: None ordered If you have labs (blood work) drawn today and your tests are completely normal, you will receive your results only by: MyChart Message (if you have MyChart) OR A paper copy in the mail If you have any lab test that is abnormal or we need to change your treatment, we will call you to review the results.   Testing/Procedures: Your physician has requested that you have a cardiac MRI. Cardiac MRI uses a computer to create images of your heart as its beating, producing both still and moving pictures of your heart and major blood vessels. For further information please visit [email or web address]. Please follow the instruction sheet below for more information.    Follow-Up: At [facility], you and your health needs are our priority.  As part of our continuing mission to provide you with exceptional heart care, we have created designated Provider Care Teams.  These Care Teams include your primary Cardiologist (physician) and Advanced Practice Providers (APPs -  Physician Assistants and Nurse Practitioners) who all work together to provide you with the care you need, when you need it.  Your next appointment:   6 month(s)  The format for your next appointment:   In Person  Provider:   You will see one of the following Advanced Practice Providers on your designated Care Team:   Francis Dowse, [place] Casimiro Needle "Mardelle Matte" Lanna Poche, [place]  Thank you for choosing [facility]!!    [phone]  Other Instructions    You are scheduled for Cardiac MRI on ______________. Please arrive for your appointment at ______________ ( arrive 30-45 minutes prior to test start time). ?  [facility] [address] [place], [place] [address] [phone] Please take advantage of the free valet parking available at the MAIN entrance (A entrance).  Proceed to the [facility] Radiology Department (First Floor) for check-in.   OR   [facility] [address] [place], [place] [address] [phone] Please take advantage of the free valet parking available at the MAIN entrance. Proceed to [facility] registration for check-in (first floor).  Magnetic resonance imaging (MRI) is a painless test that produces images of the inside of the body without using Xrays.  During an MRI, strong magnets and radio waves work together in a Data processing manager to form detailed images.   MRI images may provide more details about a medical condition than X-rays, CT scans, and ultrasounds can provide.  You may be given earphones to listen for instructions.  You may eat a light breakfast and take medications as ordered with the exception of furosemide, hydrochlorothiazide, or spironolactone(fluid pill, other). Please avoid stimulants for 12 hr prior to test. (Ie. Caffeine, nicotine, chocolate, or antihistamine medications)  If a contrast material will be used, an IV will be inserted into one of your veins. Contrast material will be injected into your IV. It will leave your body through your urine within a day. You may be told to drink plenty of fluids to help flush the contrast material out of your system.  You will be asked to remove all metal, including: Watch, jewelry, and other metal objects including hearing aids, hair pieces and dentures. Also wearable glucose monitoring systems (ie. [employer] and [employer]) (Braces and fillings normally are not a problem.)   TEST WILL TAKE APPROXIMATELY 1 HOUR  PLEASE NOTIFY SCHEDULING AT LEAST 24 HOURS IN ADVANCE IF YOU ARE UNABLE TO KEEP YOUR APPOINTMENT. [phone]  Please call Rockwell Alexandria, cardiac imaging nurse navigator  with any questions/concerns. Rockwell Alexandria RN Navigator Cardiac Imaging Larey Brick RN Navigator Cardiac Imaging [facility] Heart and Vascular Services [phone] Office

## 2022-10-14 NOTE — Progress Notes (Signed)
  Electrophysiology Office Follow up Visit Note:    Date:  10/14/2022   ID:  Todd [name], DOB 1955-11-18, MRN [id number]  PCP:  Sherol Dade, DO  [facility] Cardiologist:  Marjo Bicker, MD  [facility] Electrophysiologist:  Lanier Prude, MD    Interval History:    Todd [name] is a 67 y.o. male who presents for a follow up visit.   Last seen 07/09/2021 for syncope and VT. He also has paroxysmal AF, HTN, DM, HLD, LE amputation, poorly controlled DM, CKD. He has previously been evaluated for possible ICD but was deemed not a candidate due to substance abuse and reversible causes of his arrhythmias (LAD occlusion) There have been concerns in the past for cardiac sarcoid based on cMR.   His amiodarone was previously stopped due to a prolonged QT and he was started on mexiletine.   Today, he is accompanied by a family member. He denies any recurrent arrhythmias.   Generally he is feeling fine. His strength and energy levels have been good lately.  He denies any palpitations, chest pain, shortness of breath, or peripheral edema. No lightheadedness, headaches, syncope, orthopnea, or PND.      Past medical, surgical, social and family history were reviewed.  ROS:   Please see the history of present illness.    All other systems reviewed and are negative.  EKGs/Labs/Other Studies Reviewed:    The following studies were reviewed today:   Physical Exam:    VS:  BP 130/80   Pulse 64   SpO2 99%     Wt Readings from Last 3 Encounters:  06/24/22 147 lb (66.7 kg)  05/14/22 149 lb 14.6 oz (68 kg)  05/09/22 149 lb 0.5 oz (67.6 kg)     GEN: Well nourished, well developed in no acute distress. In wheelchair, bilateral AKA. CARDIAC: RRR, no murmurs, rubs, gallops RESPIRATORY:  Clear to auscultation without rales, wheezing or rhonchi       ASSESSMENT:    1. V-tach (HCC)   2. Coronary artery disease involving native heart without angina pectoris,  unspecified vessel or lesion type   3. PAF (paroxysmal atrial fibrillation) (HCC)    PLAN:    In order of problems listed above:   #HFrEF #ICM NYHA II. Warm and dry. Follows with Dr Jenene Slicker.   #VT Unclear exact cause. Prior episodes in setting of cocaine use and with a critical LAD stenosis that has now been stented.  Electrically quiescent on mexiletine, while abstaining from cocaine and after PCI to the LAD.  Given his prior MRI and ? Re: sarcoid, I would like to repeat his cMR.   Follow up in 3 months after cMR.   I,Mathew Stumpf,acting as a Neurosurgeon for Lanier Prude, MD.,have documented all relevant documentation on the behalf of Lanier Prude, MD,as directed by  Lanier Prude, MD while in the presence of Lanier Prude, MD.  I, Lanier Prude, MD, have reviewed all documentation for this visit. The documentation on 10/14/22 for the exam, diagnosis, procedures, and orders are all accurate and complete.   Signed, Steffanie Dunn, MD, [facility], [facility] 10/14/2022 1:26 PM    Electrophysiology [facility]

## 2022-10-17 ENCOUNTER — Ambulatory Visit (INDEPENDENT_AMBULATORY_CARE_PROVIDER_SITE_OTHER): Payer: Medicare (Managed Care) | Admitting: Nurse Practitioner

## 2022-10-17 ENCOUNTER — Encounter: Payer: Self-pay | Admitting: Nurse Practitioner

## 2022-10-17 VITALS — BP 126/82 | HR 63

## 2022-10-17 DIAGNOSIS — E039 Hypothyroidism, unspecified: Secondary | ICD-10-CM | POA: Diagnosis not present

## 2022-10-17 DIAGNOSIS — E782 Mixed hyperlipidemia: Secondary | ICD-10-CM

## 2022-10-17 DIAGNOSIS — E559 Vitamin D deficiency, unspecified: Secondary | ICD-10-CM | POA: Diagnosis not present

## 2022-10-17 DIAGNOSIS — Z794 Long term (current) use of insulin: Secondary | ICD-10-CM | POA: Diagnosis not present

## 2022-10-17 DIAGNOSIS — I10 Essential (primary) hypertension: Secondary | ICD-10-CM | POA: Diagnosis not present

## 2022-10-17 DIAGNOSIS — F17200 Nicotine dependence, unspecified, uncomplicated: Secondary | ICD-10-CM

## 2022-10-17 DIAGNOSIS — E1121 Type 2 diabetes mellitus with diabetic nephropathy: Secondary | ICD-10-CM

## 2022-10-17 LAB — POCT GLYCOSYLATED HEMOGLOBIN (HGB A1C): Hemoglobin A1C: 5.3 % (ref 4.0–5.6)

## 2022-10-17 MED ORDER — LEVOTHYROXINE SODIUM 75 MCG PO TABS: 75.0000 ug | ORAL_TABLET | Freq: Every day | ORAL | 3 refills | Status: DC

## 2022-10-17 NOTE — Progress Notes (Signed)
10/17/2022, 12:29 PM        Endocrinology follow-up note       Subjective:    Patient ID: Todd [name], male    DOB: 12-13-1955.  Todd [name] is here to follow-up for management of currently uncontrolled symptomatic diabetes requested by  Sherol Dade, DO.   Past Medical History:  Diagnosis Date   AKI (acute kidney injury) (HCC)    Alcohol abuse    Cocaine abuse (HCC) 01/25/2021   Constipated    Diabetes mellitus without complication (HCC)    Diarrhea    Elevated LFTs    Goiter    Gout    Hyperlipidemia    Hypertension    Leukocytosis    Reactive thrombocytosis    Right BKA infection (HCC) 08/2016   Right leg pain    Sepsis due to undetermined organism [facility])    Thyroid disease    Wound infection after surgery 08/2016    Past Surgical History:  Procedure Laterality Date   ABDOMINAL AORTOGRAM N/A 08/11/2016   Procedure: Abdominal Aortogram;  Surgeon: Maeola Harman, MD;  Location: [facility] INVASIVE CV LAB;  Service: Cardiovascular;  Laterality: N/A;   ABDOMINAL AORTOGRAM W/LOWER EXTREMITY N/A 08/15/2016   Procedure: Abdominal Aortogram w/Lower Extremity;  Surgeon: Sherren Kerns, MD;  Location: [facility] INVASIVE CV LAB;  Service: Cardiovascular;  Laterality: N/A;   AMPUTATION Right 08/17/2016   Procedure: RIGHT BELOW KNEE AMPUTATION;  Surgeon: Sherren Kerns, MD;  Location: [facility] OR;  Service: Vascular;  Laterality: Right;   AMPUTATION Right 09/12/2016   Procedure: AMPUTATION ABOVE KNEE;  Surgeon: Nadara Mustard, MD;  Location: [facility] OR;  Service: Orthopedics;  Laterality: Right;   AMPUTATION Left 08/12/2016   Procedure: LEFT BELOW KNEE AMPUTATION;  Surgeon: Nadara Mustard, MD;  Location: [facility] OR;  Service: Orthopedics;  Laterality: Left;   AMPUTATION Left 11/01/2017   Procedure: LEFT ABOVE KNEE AMPUTATION;  Surgeon: Nadara Mustard, MD;  Location: [facility] OR;  Service: Orthopedics;  Laterality: Left;   APPLICATION  OF WOUND VAC Right 09/12/2016   Procedure: APPLICATION OF WOUND VAC ABOVE KNEE;  Surgeon: Nadara Mustard, MD;  Location: [facility] OR;  Service: Orthopedics;  Laterality: Right;   BIOPSY  01/13/2021   Procedure: BIOPSY;  Surgeon: Malissa Hippo, MD;  Location: [facility] ENDO SUITE;  Service: Endoscopy;;  esophageal   COLONOSCOPY WITH PROPOFOL N/A 01/13/2021   Procedure: COLONOSCOPY WITH PROPOFOL;  Surgeon: Malissa Hippo, MD;  Location: [facility] ENDO SUITE;  Service: Endoscopy;  Laterality: N/A;   CORONARY PRESSURE/FFR STUDY N/A 05/09/2022   Procedure: INTRAVASCULAR PRESSURE WIRE/FFR STUDY;  Surgeon: Yvonne Kendall, MD;  Location: [facility] INVASIVE CV LAB;  Service: Cardiovascular;  Laterality: N/A;   CORONARY STENT INTERVENTION N/A 05/09/2022   Procedure: CORONARY STENT INTERVENTION;  Surgeon: Yvonne Kendall, MD;  Location: [facility] INVASIVE CV LAB;  Service: Cardiovascular;  Laterality: N/A;   ESOPHAGOGASTRODUODENOSCOPY (EGD) WITH PROPOFOL N/A 01/13/2021   Procedure: ESOPHAGOGASTRODUODENOSCOPY (EGD) WITH PROPOFOL;  Surgeon: Malissa Hippo, MD;  Location: [facility]ITE;  Service: Endoscopy;  Laterality: N/A;   LEFT HEART CATH AND CORONARY ANGIOGRAPHY N/A 05/09/2022   Procedure: LEFT HEART CATH AND CORONARY ANGIOGRAPHY;  Surgeon: Yvonne Kendall, MD;  Location: [facility] INVASIVE CV LAB;  Service: Cardiovascular;  Laterality: N/A;   LOWER EXTREMITY ANGIOGRAPHY Bilateral 08/11/2016   Procedure: Lower Extremity Angiography;  Surgeon: Maeola Harman, MD;  Location: [facility] INVASIVE CV LAB;  Service: Cardiovascular;  Laterality: Bilateral;   PERIPHERAL VASCULAR BALLOON ANGIOPLASTY Left 08/11/2016   Procedure: Peripheral Vascular Balloon Angioplasty;  Surgeon: Maeola Harman, MD;  Location: [facility] INVASIVE CV LAB;  Service: Cardiovascular;  Laterality: Left;  SFA   POLYPECTOMY  01/13/2021   Procedure: POLYPECTOMY;  Surgeon: Malissa Hippo, MD;  Location: [facility] ENDO [facility];  Service: Endoscopy;;   THYROID SURGERY      Social History    Socioeconomic History   Marital status: Single    Spouse name: Not on file   Number of children: Not on file   Years of education: Not on file   Highest education level: Not on file  Occupational History   Occupation: retired    Comment: drove a Glass blower/designer  Tobacco Use   Smoking status: Every Day    Packs/day: 0.25    Years: 45.00    Additional pack years: 0.00    Total pack years: 11.25    Types: Cigarettes    Last attempt to quit: 11/12/2014    Years since quitting: 7.9    Passive exposure: Never   Smokeless tobacco: Never  Vaping Use   Vaping Use: Never used  Substance and Sexual Activity   Alcohol use: Not Currently    Alcohol/week: 2.0 standard drinks of alcohol    Types: 1 Glasses of wine, 1 Cans of beer per week   Drug use: Not Currently    Types: Cocaine   Sexual activity: Not Currently  Other Topics Concern   Not on file  Social History Narrative   09/23/20 - Lives alone, uses a wheelchair, double amputee, not married, no children - HHA comes in 3x per week to help him bathe and set up weekly meds.   Social Determinants of Health   Financial Resource Strain: Low Risk  (09/23/2020)   Overall Financial Resource Strain (CARDIA)    Difficulty of Paying Living Expenses: Not very hard  Food Insecurity: No Food Insecurity (05/16/2022)   Hunger Vital Sign    Worried About Running Out of Food in the Last Year: Never true    Ran Out of Food in the Last Year: Never true  Transportation Needs: No Transportation Needs (05/04/2022)   PRAPARE - Administrator, Civil Service (Medical): No    Lack of Transportation (Non-Medical): No  Physical Activity: Inactive (09/23/2020)   Exercise Vital Sign    Days of Exercise per Week: 0 days    Minutes of Exercise per Session: 0 min  Stress: No Stress Concern Present (09/23/2020)   [employer] of Occupational Health - Occupational Stress Questionnaire    Feeling of Stress : Only a little  Social Connections: Socially  Isolated (09/23/2020)   Social Connection and Isolation Panel [NHANES]    Frequency of Communication with Friends and Family: More than three times a week    Frequency of Social Gatherings with Friends and Family: More than three times a week    Attends Religious Services: Never    Database administrator or Organizations: No    Attends Banker Meetings: Never    Marital Status: Never married    Family History  Problem Relation Age of Onset   Heart disease Mother    Pneumonia Father    Diabetes Maternal Aunt    Diabetes  Maternal Uncle     Outpatient Encounter Medications as of 10/17/2022  Medication Sig   acetaminophen (TYLENOL) 325 MG tablet Take 2 tablets (650 mg total) by mouth every 6 (six) hours as needed for mild pain (or Fever >/= 101). (Patient taking differently: Take 650 mg by mouth every 6 (six) hours as needed for moderate pain.)   allopurinol (ZYLOPRIM) 100 MG tablet Take 1 tablet (100 mg total) by mouth daily. (Patient taking differently: Take 100 mg by mouth in the morning.)   apixaban (ELIQUIS) 5 MG TABS tablet Take 1 tablet (5 mg total) by mouth 2 (two) times daily.   Ascorbic Acid 500 MG CAPS Take 500 mg by mouth daily.   atorvastatin (LIPITOR) 80 MG tablet Take 1 tablet (80 mg total) by mouth daily.   calcium carbonate (TUMS - DOSED IN MG ELEMENTAL CALCIUM) 500 MG chewable tablet Chew 1,000 mg by mouth 3 (three) times daily before meals.   Cholecalciferol (VITAMIN D3) 50 MCG (2000 UT) capsule Take 2,000 Units by mouth in the morning.   clopidogrel (PLAVIX) 75 MG tablet Take 1 tablet (75 mg total) by mouth daily with breakfast.   cyanocobalamin (V-R VITAMIN B-12) 500 MCG tablet Take 1 tablet (500 mcg total) by mouth in the morning.   FIASP FLEXTOUCH 100 UNIT/ML FlexTouch Pen Inject 0-6 Units into the skin 3 (three) times daily before meals. Per SSI   folic acid (FOLVITE) 1 MG tablet Take 1 mg by mouth in the morning.   hydrALAZINE (APRESOLINE) 25 MG tablet  Take 1 tablet (25 mg total) by mouth 3 (three) times daily.   isosorbide mononitrate (IMDUR) 60 MG 24 hr tablet Take 1 tablet (60 mg total) by mouth daily.   LANTUS SOLOSTAR 100 UNIT/ML Solostar Pen Inject 12 Units into the skin at bedtime.   levothyroxine (SYNTHROID) 75 MCG tablet Take 1 tablet (75 mcg total) by mouth daily before breakfast.   lidocaine (LIDODERM) 5 % Place 1 patch onto the skin daily. Remove & Discard patch within 12 hours or as directed by MD   metoprolol succinate (TOPROL-XL) 100 MG 24 hr tablet Take 1 tablet (100 mg total) by mouth daily.   mexiletine (MEXITIL) 150 MG capsule Take 1 capsule (150 mg total) by mouth every 8 (eight) hours.   mirtazapine (REMERON) 15 MG tablet Take 15 mg by mouth at bedtime.   Multiple Vitamin (MULTIVITAMIN WITH MINERALS) TABS tablet Take 1 tablet by mouth daily. (Patient taking differently: Take 1 tablet by mouth in the morning.)   Nutritional Supplements (NUTRITIONAL DRINK) LIQD Take 240 mLs by mouth daily at 12 noon. House Supplement   oxyCODONE (ROXICODONE) 5 MG immediate release tablet Take 1-2 tablets (5-10 mg total) by mouth every 6 (six) hours as needed for breakthrough pain or severe pain.   pantoprazole (PROTONIX) 40 MG tablet Take 1 tablet (40 mg total) by mouth 2 (two) times daily.   polyethylene glycol (MIRALAX / GLYCOLAX) 17 g packet Take 17 g by mouth daily. (Patient taking differently: Take 17 g by mouth in the morning.)   sertraline (ZOLOFT) 50 MG tablet Take 1 tablet (50 mg total) by mouth at bedtime.   sodium bicarbonate 650 MG tablet Take 650 mg by mouth 3 (three) times daily before meals.   thiamine 100 MG tablet Take 1 tablet (100 mg total) by mouth daily. (Patient taking differently: Take 100 mg by mouth in the morning.)   [DISCONTINUED] insulin lispro (ADMELOG SOLOSTAR) 100 UNIT/ML KwikPen Inject 16 Units into  the skin at bedtime.   [DISCONTINUED] levothyroxine (SYNTHROID) 100 MCG tablet Take 100 mcg by mouth daily before  breakfast.   Glucagon, rDNA, (GLUCAGON EMERGENCY) 1 MG KIT Inject 1 mg into the muscle as needed (hypoglycemia).   [DISCONTINUED] insulin lispro (HUMALOG) 100 UNIT/ML KwikPen Inject 3-9 Units into the skin 3 (three) times daily. Sliding scale 90-150-3 units, 151-200- 4units, 200-250 5units, 251-300 6units, 301-350 7units, 351-400 8units,401-450 9units (Patient not taking: Reported on 10/17/2022)   No facility-administered encounter medications on file as of 10/17/2022.    ALLERGIES: Allergies  Allergen Reactions   Lisinopril Other (See Comments)    Hyperkalemia / Renal failure    VACCINATION STATUS: Immunization History  Administered Date(s) Administered   Influenza,inj,Quad PF,6+ Mos 05/20/2015, 04/07/2017, 03/09/2018, 05/01/2019   Influenza-Unspecified 05/10/2011, 04/18/2022   PFIZER Comirnaty(Gray Top)Covid-19 Tri-Sucrose Vaccine 07/28/2020, 08/18/2020, 08/18/2020   PPD Test 09/16/2016    Diabetes He presents for his follow-up diabetic visit. He has type 2 diabetes mellitus. Onset time: Patient was diagnosed with A1c of greater than 14% in 2017 at the age of 59 years. His disease course has been improving. Hypoglycemia symptoms include tremors. Pertinent negatives for hypoglycemia include no confusion, headaches, nervousness/anxiousness, pallor or seizures. Pertinent negatives for diabetes include no chest pain, no fatigue, no polydipsia, no polyphagia, no polyuria and no weakness. There are no hypoglycemic complications. Symptoms are stable. Diabetic complications include nephropathy and PVD. Risk factors for coronary artery disease include dyslipidemia, diabetes mellitus, male sex, sedentary lifestyle, tobacco exposure and hypertension. Current diabetic treatment includes intensive insulin program. He is compliant with treatment all of the time (now in SNF). His weight is fluctuating minimally. He is following a generally healthy diet. When asked about meal planning, he reported none. He has  not had a previous visit with a dietitian. He never participates in exercise. His home blood glucose trend is decreasing steadily. His breakfast blood glucose range is generally 90-110 mg/dl. His overall blood glucose range is 90-110 mg/dl. (He presents today, accompanied by care attendant from the nursing home, with his logs showing tight glycemic profile overall.  His POCT A1c today is 5.3%, improving from last visit of 6.2%.  He denies any major hypoglycemia, none documented.  ) An ACE inhibitor/angiotensin II receptor blocker is not being taken. He does not see a podiatrist.Eye exam is current.  Hyperlipidemia This is a chronic problem. The current episode started more than 1 year ago. The problem is controlled. Recent lipid tests were reviewed and are normal. Exacerbating diseases include chronic renal disease, diabetes and hypothyroidism. Factors aggravating his hyperlipidemia include beta blockers. Pertinent negatives include no chest pain, myalgias or shortness of breath. Current antihyperlipidemic treatment includes statins. The current treatment provides mild improvement of lipids. Compliance problems include adherence to diet and adherence to exercise.  Risk factors for coronary artery disease include diabetes mellitus, dyslipidemia, a sedentary lifestyle, male sex and hypertension.  Thyroid Problem Presents for follow-up visit. Onset time: He reports thyroidectomy in 2013 due to benign nodular goiter. Symptoms include palpitations and tremors. Patient reports no anxiety, cold intolerance, constipation, depressed mood, diarrhea, fatigue or heat intolerance. The symptoms have been stable. The treatment provided moderate relief. Prior procedures include thyroidectomy. His past medical history is significant for diabetes and hyperlipidemia.     Review of systems  Constitutional: + Minimally fluctuating body weight,  current There is no height or weight on file to calculate BMI. , no fatigue, no  subjective hyperthermia, no subjective hypothermia Eyes: no blurry vision,  no xerophthalmia ENT: no sore throat, no nodules palpated in throat, no dysphagia/odynophagia, no hoarseness Cardiovascular: no chest pain, no shortness of breath, + intermittent palpitations, no leg swelling Respiratory: no cough, no shortness of breath Gastrointestinal: no nausea/vomiting/diarrhea Musculoskeletal: no muscle/joint aches, hx of bilateral AKA- WC bound Skin: no rashes, no hyperemia Neurological: + tremors, no numbness, no tingling, no dizziness Psychiatric: no depression, no anxiety  Objective:    BP 126/82 (BP Location: Left Arm, Patient Position: Sitting, Cuff Size: Normal)   Pulse 63   Wt Readings from Last 3 Encounters:  06/24/22 147 lb (66.7 kg)  05/14/22 149 lb 14.6 oz (68 kg)  05/09/22 149 lb 0.5 oz (67.6 kg)    BP Readings from Last 3 Encounters:  10/17/22 126/82  10/14/22 130/80  09/05/22 (!) 142/80    Review of systems  Constitutional: + Minimally fluctuating body weight,  current There is no height or weight on file to calculate BMI. , no fatigue, no subjective hyperthermia, no subjective hypothermia Eyes: no blurry vision, no xerophthalmia Gastrointestinal: no nausea/vomiting/diarrhea Musculoskeletal: no muscle/joint aches, hx of bilateral AKA- WC bound Skin: no rashes, no hyperemia, nicotinic discoloration to fingernails bilaterally Neurological: no tremors, no numbness, no tingling, no dizziness Psychiatric: no depression, no anxiety    Diabetic Labs (most recent): Lab Results  Component Value Date   HGBA1C 5.3 10/17/2022   HGBA1C 6.2 (A) 05/13/2022   HGBA1C 5.6 01/11/2022     Lipid Panel ( most recent) Lipid Panel     Component Value Date/Time   CHOL 89 10/06/2022 0000   CHOL 59 (L) 09/22/2020 0923   TRIG 168 (A) 10/06/2022 0000   HDL 28 (A) 10/06/2022 0000   HDL 25 (L) 09/22/2020 0923   CHOLHDL 2.1 05/06/2022 0416   VLDL 13 05/06/2022 0416   LDLCALC 20  10/06/2022 0000   LDLCALC 19 09/22/2020 0923     Recent Results (from the past 2160 hour(s))  Basic metabolic panel     Status: Abnormal   Collection Time: 10/06/22 12:00 AM  Result Value Ref Range   Glucose 74    BUN 26 (A) 4 - 21   CO2 21 13 - 22   Creatinine 2.2 (A) 0.6 - 1.3   Potassium 4.7 3.5 - 5.1 mEq/L   Sodium 145 137 - 147   Chloride 119 (A) 99 - 108  Comprehensive metabolic panel     Status: Abnormal   Collection Time: 10/06/22 12:00 AM  Result Value Ref Range   Calcium 6.7 (A) 8.7 - 10.7   Albumin 3.3 (A) 3.5 - 5.0  Lipid panel     Status: Abnormal   Collection Time: 10/06/22 12:00 AM  Result Value Ref Range   LDl/HDL Ratio 3.1    Triglycerides 168 (A) 40 - 160   Cholesterol 89 0 - 200   HDL 28 (A) 35 - 70   LDL Cholesterol 20   Hepatic function panel     Status: Abnormal   Collection Time: 10/06/22 12:00 AM  Result Value Ref Range   Alkaline Phosphatase 284 (A) 25 - 125   ALT 18 10 - 40 U/L   AST 28 14 - 40   Bilirubin, Total 0.2   TSH     Status: Abnormal   Collection Time: 10/06/22 12:00 AM  Result Value Ref Range   TSH 0.03 (A) 0.41 - 5.90    Comment: T4 FREE 11.3  CBC and differential     Status: Abnormal   Collection  Time: 10/14/22 12:00 AM  Result Value Ref Range   Hemoglobin 11.2 (A) 13.5 - 17.5   HCT 35 (A) 41 - 53  Basic metabolic panel     Status: Abnormal   Collection Time: 10/14/22 12:00 AM  Result Value Ref Range   Glucose 117    BUN 27 (A) 4 - 21   Creatinine 2.5 (A) 0.6 - 1.3  Comprehensive metabolic panel     Status: Abnormal   Collection Time: 10/14/22 12:00 AM  Result Value Ref Range   eGFR 28    Calcium 6.7 (A) 8.7 - 10.7  HgB A1c     Status: Normal   Collection Time: 10/17/22 10:56 AM  Result Value Ref Range   Hemoglobin A1C 5.3 4.0 - 5.6 %   HbA1c POC (<> result, manual entry)     HbA1c, POC (prediabetic range)     HbA1c, POC (controlled diabetic range)          Assessment & Plan:   1) Type 2 diabetes mellitus  with stage 4 chronic kidney disease, with long-term current use of insulin (HCC)  - Todd [name] has currently uncontrolled symptomatic type 2 DM since 67 years of age.  He presents today, accompanied by care attendant from the nursing home, with his logs showing tight glycemic profile overall.  His POCT A1c today is 5.3%, improving from last visit of 6.2%.  He denies any major hypoglycemia, none documented.    -This patient has history of heavy alcohol use/abuse which might have contributed to the pathogenesis of his diabetes. -I had a long discussion with him about the progressive nature of type 2 diabetes and the pathology behind its complications.   -his diabetes is complicated by peripheral arterial disease status post bilateral above-knee amputation -sedentary life, renal insufficiency, and he remains at extremely high risk for more acute and chronic complications which include CAD, CVA, CKD, retinopathy, and neuropathy. These are all discussed in detail with him.  - Nutritional counseling repeated at each appointment due to patients tendency to fall back in to old habits.  - The patient admits there is a room for improvement in their diet and drink choices. -  Suggestion is made for the patient to avoid simple carbohydrates from their diet including Cakes, Sweet Desserts / Pastries, Ice Cream, Soda (diet and regular), Sweet Tea, Candies, Chips, Cookies, Sweet Pastries, Store Bought Juices, Alcohol in Excess of 1-2 drinks a day, Artificial Sweeteners, Coffee Creamer, and "Sugar-free" Products. This will help patient to have stable blood glucose profile and potentially avoid unintended weight gain.   - I encouraged the patient to switch to unprocessed or minimally processed complex starch and increased protein intake (animal or plant source), fruits, and vegetables.   - Patient is advised to stick to a routine mealtimes to eat 3 meals a day and avoid unnecessary snacks (to snack only to  correct hypoglycemia).  - I have approached him with the following individualized plan to manage diabetes and patient agrees:   -#1 priority in his care will be to avoid hypoglycemia.  -Due to tightening readings, he will tolerate reduction in his Lantus/Admelog to 12 units SQ nightly and continue Humalog 0-6 units TID with meals if glucose is above 90 and he is eating (Specific instructions on how to titrate insulin dosage based on glucose readings given to patient in writing).   -He is encouraged to continue monitoring blood glucose 4 times daily, before meals and before bed, and call the clinic  if readings are less than 70 or greater than 200 for 3 tests in a row.  -In this patient with high likelihood of pancreatic diabetes, tight glycemic profile is not advised as he may be lacking endogenous glucagon response which puts him at high risk for hypoglycemia.  - he is not a candidate for Metformin, SGLT2 inhibitors due to concurrent renal insufficiency. -He is not a candidate for incretin therapy due to his background history of alcoholism with possible risk of pancreatitis.  - Patient specific target  A1c;  LDL, HDL, Triglycerides, and  Waist Circumference were discussed in detail.  2) Blood Pressure /Hypertension: -His blood pressure is controlled to target. He is advised to continue Metoprolol 100 mg po daily, Hydralazine 25 mg TID and Lasix 20 mg po daily.  3) Lipids/Hyperlipidemia: His most recent lipid panel from 10/06/22 shows controlled LDL of 20 and elevated triglycerides of 168.  He is advised to continue Lipitor 40 mg po daily at bedtime.  Side effects and precautions discussed with him.  He is advised to avoid fried foods and butter.  4)  Weight/Diet: -   His There is no height or weight on file to calculate BMI.  CDE Consult has been initiated . Exercise, and detailed carbohydrates information provided  -  detailed on discharge instructions.  He cannot exercise optimally,  wheelchair-bound due to bilateral above-knee amputations.  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  His previsit TFTs are consistent with over-replacement.  He is advised to lower her Levothyroxine to 75 mcg po daily before breakfast (specific instructions on administration written out on order form).  He is high risk for negative outcomes for over-replacement given his history of heart arrhythmias, therefore medication adjustments should be performed with caution.  Will recheck TFTs prior to next visit and adjust dose if needed.   - We discussed about the correct intake of his thyroid hormone, on empty stomach at fasting, with water, separated by at least 30 minutes from breakfast and other medications,  and separated by more than 4 hours from calcium, iron, multivitamins, acid reflux medications (PPIs). -Patient is made aware of the fact that thyroid hormone replacement is needed for life, dose to be adjusted by periodic monitoring of thyroid function tests.  6) Vitamin D deficiency: -Last vitamin D level on 08/06/20 was 52.3.  He is advised to continue vitamin D3 5000 units daily as maintenance dose until next measurement.  7) Chronic Care/Health Maintenance: -he is on statin medications and is encouraged to initiate and continue to follow up with Ophthalmology, Dentist,  Podiatrist at least yearly or according to recommendations, and advised to stay away from smoking. I have recommended yearly flu vaccine and pneumonia vaccine at least every 5 years;  and  sleep for at least 7 hours a day.  -He cannot exercise optimally- WC bound from bilateral AKA.   - he is  advised to maintain close follow up with Sherol Dade, DO for primary care needs, as well as his other providers for optimal and coordinated care.      I spent  44  minutes in the care of the patient today including review of labs from CMP,  Lipids, Thyroid Function, Hematology (current and previous including abstractions from other facilities); face-to-face time discussing  his blood glucose readings/logs, discussing hypoglycemia and hyperglycemia episodes and symptoms, medications doses, his options of short and long term treatment based on the latest standards of  care / guidelines;  discussion about incorporating lifestyle medicine;  and documenting the encounter. Risk reduction counseling performed per USPSTF guidelines to reduce obesity and cardiovascular risk factors.     Please refer to Patient Instructions for Blood Glucose Monitoring and Insulin/Medications Dosing Guide"  in media tab for additional information. Please  also refer to " Patient Self Inventory" in the Media  tab for reviewed elements of pertinent patient history.  Hedwig Morton participated in the discussions, expressed understanding, and voiced agreement with the above plans.  All questions were answered to his satisfaction. he is encouraged to contact clinic should he have any questions or concerns prior to his return visit.   Follow up plan: - Return in about 3 months (around 01/17/2023) for Diabetes F/U with A1c in office, Thyroid follow up, Previsit labs, Bring meter and logs.  Ronny Bacon, [facility] [address] [place], [place] [address] Phone: [phone] Fax: [phone]  10/17/2022, 12:29 PM

## 2022-10-17 NOTE — Patient Instructions (Signed)

## 2022-10-19 ENCOUNTER — Inpatient Hospital Stay: Payer: Medicare (Managed Care) | Attending: Physician Assistant

## 2022-10-19 DIAGNOSIS — N1832 Chronic kidney disease, stage 3b: Secondary | ICD-10-CM | POA: Diagnosis not present

## 2022-10-19 DIAGNOSIS — D75839 Thrombocytosis, unspecified: Secondary | ICD-10-CM | POA: Insufficient documentation

## 2022-10-19 DIAGNOSIS — D649 Anemia, unspecified: Secondary | ICD-10-CM

## 2022-10-19 DIAGNOSIS — Z833 Family history of diabetes mellitus: Secondary | ICD-10-CM | POA: Diagnosis not present

## 2022-10-19 DIAGNOSIS — Z8249 Family history of ischemic heart disease and other diseases of the circulatory system: Secondary | ICD-10-CM | POA: Diagnosis not present

## 2022-10-19 DIAGNOSIS — Z79899 Other long term (current) drug therapy: Secondary | ICD-10-CM | POA: Diagnosis not present

## 2022-10-19 DIAGNOSIS — Z825 Family history of asthma and other chronic lower respiratory diseases: Secondary | ICD-10-CM | POA: Insufficient documentation

## 2022-10-19 DIAGNOSIS — D631 Anemia in chronic kidney disease: Secondary | ICD-10-CM

## 2022-10-19 DIAGNOSIS — E1122 Type 2 diabetes mellitus with diabetic chronic kidney disease: Secondary | ICD-10-CM | POA: Insufficient documentation

## 2022-10-19 DIAGNOSIS — E538 Deficiency of other specified B group vitamins: Secondary | ICD-10-CM | POA: Diagnosis not present

## 2022-10-19 DIAGNOSIS — D72829 Elevated white blood cell count, unspecified: Secondary | ICD-10-CM | POA: Insufficient documentation

## 2022-10-19 DIAGNOSIS — F17210 Nicotine dependence, cigarettes, uncomplicated: Secondary | ICD-10-CM | POA: Insufficient documentation

## 2022-10-19 DIAGNOSIS — I5032 Chronic diastolic (congestive) heart failure: Secondary | ICD-10-CM | POA: Diagnosis not present

## 2022-10-19 DIAGNOSIS — I480 Paroxysmal atrial fibrillation: Secondary | ICD-10-CM | POA: Diagnosis not present

## 2022-10-19 LAB — CBC WITH DIFFERENTIAL/PLATELET
Abs Immature Granulocytes: 0.04 10*3/uL (ref 0.00–0.07)
Basophils Absolute: 0 10*3/uL (ref 0.0–0.1)
Basophils Relative: 0 %
Eosinophils Absolute: 0.6 10*3/uL — ABNORMAL HIGH (ref 0.0–0.5)
Eosinophils Relative: 6 %
HCT: 36 % — ABNORMAL LOW (ref 39.0–52.0)
Hemoglobin: 11.5 g/dL — ABNORMAL LOW (ref 13.0–17.0)
Immature Granulocytes: 0 %
Lymphocytes Relative: 23 %
Lymphs Abs: 2.3 10*3/uL (ref 0.7–4.0)
MCH: 27.3 pg (ref 26.0–34.0)
MCHC: 31.9 g/dL (ref 30.0–36.0)
MCV: 85.3 fL (ref 80.0–100.0)
Monocytes Absolute: 0.5 10*3/uL (ref 0.1–1.0)
Monocytes Relative: 5 %
Neutro Abs: 6.3 10*3/uL (ref 1.7–7.7)
Neutrophils Relative %: 66 %
Platelets: 562 10*3/uL — ABNORMAL HIGH (ref 150–400)
RBC: 4.22 MIL/uL (ref 4.22–5.81)
RDW: 15.2 % (ref 11.5–15.5)
WBC: 9.8 10*3/uL (ref 4.0–10.5)
nRBC: 0 % (ref 0.0–0.2)

## 2022-10-19 LAB — IRON AND TIBC
Iron: 46 ug/dL (ref 45–182)
Saturation Ratios: 20 % (ref 17.9–39.5)
TIBC: 232 ug/dL — ABNORMAL LOW (ref 250–450)
UIBC: 186 ug/dL

## 2022-10-19 LAB — COMPREHENSIVE METABOLIC PANEL
ALT: 24 U/L (ref 0–44)
AST: 30 U/L (ref 15–41)
Albumin: 2.9 g/dL — ABNORMAL LOW (ref 3.5–5.0)
Alkaline Phosphatase: 266 U/L — ABNORMAL HIGH (ref 38–126)
Anion gap: 10 (ref 5–15)
BUN: 28 mg/dL — ABNORMAL HIGH (ref 8–23)
CO2: 21 mmol/L — ABNORMAL LOW (ref 22–32)
Calcium: 6.9 mg/dL — ABNORMAL LOW (ref 8.9–10.3)
Chloride: 111 mmol/L (ref 98–111)
Creatinine, Ser: 1.9 mg/dL — ABNORMAL HIGH (ref 0.61–1.24)
GFR, Estimated: 38 mL/min — ABNORMAL LOW (ref >60)
Glucose, Bld: 95 mg/dL (ref 70–99)
Potassium: 3.8 mmol/L (ref 3.5–5.1)
Sodium: 142 mmol/L (ref 135–145)
Total Bilirubin: 0.5 mg/dL (ref 0.3–1.2)
Total Protein: 7.5 g/dL (ref 6.5–8.1)

## 2022-10-19 LAB — FERRITIN: Ferritin: 524 ng/mL — ABNORMAL HIGH (ref 24–336)

## 2022-10-19 LAB — VITAMIN B12: Vitamin B-12: 809 pg/mL (ref 180–914)

## 2022-10-25 NOTE — Progress Notes (Unsigned)
[facility] [address], [place] [address]   CLINIC:  Medical Oncology/Hematology  PCP:  Sherol Dade, DO [address] [place] [address] [phone]   REASON FOR VISIT:  Follow-up for thrombocytosis and leukocytosis   PRIOR THERAPY: None   CURRENT THERAPY: Surveillance  INTERVAL HISTORY:   Todd [name] 67 y.o. male returns for routine follow-up of thrombocytosis and leukocytosis.  He was last evaluated via video visit with Rojelio Brenner PA-C on 06/16/2022.  He is unaccompanied today.  ***  At today's visit, he reports feeling ***.  No recent hospitalizations, surgeries, or changes in baseline health status. ***He has not had any recent steroid medications. *** Last antibiotics were in December 2023 for treatment of suspected aspiration pneumonia.    *** No interval DVT or PE. *** No symptoms of aquagenic pruritus or vasomotor symptoms.    *** No B symptoms such as fever, chills, night sweats, unintentional weight loss. *** No signs of blood loss such as rectal bleeding or melena.   He has ***% energy and ***% appetite. He endorses that he is maintaining a stable weight.  ASSESSMENT & PLAN:  1.  Thrombocytosis & neutrophilic leukocytosis -  Intermittent thrombocytosis since at least 2017, with maximum platelets 1009 on 08/30/2016. - Intermittent leukocytosis (primarily neutrophilic) since at least 2017, with highest WBC recorded at 30.6 on 09/11/2016.  (During March/April 2018 - patient was being treated for diabetic foot ulcers prior to his bilateral AKA.) - History of bilateral AKA secondary to diabetic foot ulcers/PAD (2018)   - Denies recent infections or steroid medications.  No autoimmune/connective tissue disorder.  No splenectomy.  No personal history of cancer, up-to-date on age-appropriate cancer screenings.*** - He does not take any aspirin, but is on Eliquis due to paroxysmal atrial fibrillation. - Current smoker (3 to 4  cigarettes daily since moving into SNF, previously smoked 0.5 PPD x45 years)*** - No history of DVT or PE. - Hematology work-up (01/12/2022): Negative JAK2 with reflex to CALR and MPL Negative BCR/ABL FISH Elevated ESR 45, elevated CRP 1.0.  Normal ANA and RF.  Normal LDH. Elevated ferritin with normal saturation and TIBC. - Most recent labs (10/19/2022): Platelets moderately elevated 562, normal WBC 9.8. - No aquagenic pruritus, B symptoms, or vasomotor symptoms. Treated for suspected aspiration pneumonia in December 2023.*** - PLAN: MPN work-up negative.  Due to chronicity of intermittent thrombocytosis and leukocytosis, differential diagnosis favors reactive process secondary to patient's extensive chronic comorbidities and chronic inflammation. - Repeat CBC with RTC in 6 months.  If any significant deviations from baseline we will consider bone marrow biopsy.   2.  Normocytic anemia - Labs from SNF (11/15/2021) show Hgb 10.8/MCV 81.3.  CMP (09/14/2021) showed creatinine 3.68/GFR 19. - Denies history of blood loss or iron deficiency, but received PRBC x2 in August 2022 in the setting of severe anemia (Hgb 6.6) - Hemoccult positive in August 2022. - EGD/colonoscopy (01/13/2021): HSV esophagitis with ulceration, gastritis, duodenitis.  Petechiae in ascending colon and cecum, polyps x3 (tubular adenoma), diverticulosis. - Medications include folic acid, multivitamin, thiamine, vitamin D supplements; he takes Protonix and Eliquis as well. - Has previously followed with [facility] - Hematology work-up (01/12/2022): SPEP negative.  Immunofixation with polyclonal immunoglobulins.  Elevated free light chains with normal ratio in keeping with CKD. Normal B12, copper, and folate.  Elevated ferritin 675 with normal TIBC and iron saturation 20%. Creatinine 1.97/GFR 37.  - Most recent labs (10/19/2022): Hgb 11.5, creatinine 1.90/GFR  38.  Ferritin 524, iron saturation 30% with low TIBC 232. - PLAN: Differential  diagnosis favors anemia of chronic disease/CKD stage IIIb/IV, with prior history of blood loss anemia. - No indication for ESA or IV iron at this time. - Repeat CBC, CMP, and iron panel with RTC in 6 months   3.  Borderline B12 deficiency - Labs from August 2023 show normal B12 with mildly elevated MMA 508 - He has been taking vitamin B12 500 mcg daily since September 2023  - Most recent labs (10/19/2022): Normal B12 809, MMA *** pending *** - PLAN: Continue vitamin B12 500 mcg daily.  Repeat B12/MMA annually (next due May 2025) ***   4.  Tobacco abuse - This patient meets criteria for low-dose CT lung cancer screening (active smoker, 22.5 pack year history) - LDCT chest (01/31/2022): Lung RADS category 2, benign appearance.  Incidental findings of coronary atherosclerosis, aortic atherosclerosis, nephrolithiasis, and emphysema. - PLAN: Next LDCT chest due August 2024   5.  Other history - MAJOR COMORBIDITIES: Diastolic CHF, CKD stage IIIb/IV, insulin-dependent type 2 diabetes mellitus, paroxysmal A-fib (on Eliquis) - ADDITIONAL PMH: S/p bilateral AKA (2018) secondary to diabetic foot ulcer/PAD, hypertension, GERD, gastritis/duodenitis, HSV esophagitis - SOCIAL: Currently resides at [facility].  Wheelchair dependent.  Currently disabled due to bilateral AKA.  Previously worked in [employer] representative. - SUBSTANCE: History of daily alcohol use, quit after moving into SNF in 2022.  History of daily cocaine use, quit in 2022 after moving into SNF.  Smoked 0.5 PPD cigarettes since age 81, cut back to about 3 cigarettes daily at age 23 after moving into SNF.  PLAN SUMMARY: >> LDCT chest in August 2024 *** >> Labs in 6 months = CBC/D, CMP, ferritin, iron/TIBC, ESR, CRP, LDH >> OFFICE visit in 6 months (after labs)     REVIEW OF SYSTEMS: ***  Review of Systems - Oncology   PHYSICAL EXAM:  ECOG PERFORMANCE STATUS: {CHL ONC ECOG ZO:[id number]} *** There were no vitals filed for this  visit. There were no vitals filed for this visit. Physical Exam  PAST MEDICAL/SURGICAL HISTORY:  Past Medical History:  Diagnosis Date   AKI (acute kidney injury) (HCC)    Alcohol abuse    Cocaine abuse (HCC) 01/25/2021   Constipated    Diabetes mellitus without complication (HCC)    Diarrhea    Elevated LFTs    Goiter    Gout    Hyperlipidemia    Hypertension    Leukocytosis    Reactive thrombocytosis    Right BKA infection (HCC) 08/2016   Right leg pain    Sepsis due to undetermined organism [facility])    Thyroid disease    Wound infection after surgery 08/2016   Past Surgical History:  Procedure Laterality Date   ABDOMINAL AORTOGRAM N/A 08/11/2016   Procedure: Abdominal Aortogram;  Surgeon: Maeola Harman, MD;  Location: [facility] INVASIVE CV LAB;  Service: Cardiovascular;  Laterality: N/A;   ABDOMINAL AORTOGRAM W/LOWER EXTREMITY N/A 08/15/2016   Procedure: Abdominal Aortogram w/Lower Extremity;  Surgeon: Sherren Kerns, MD;  Location: [facility] INVASIVE CV LAB;  Service: Cardiovascular;  Laterality: N/A;   AMPUTATION Right 08/17/2016   Procedure: RIGHT BELOW KNEE AMPUTATION;  Surgeon: Sherren Kerns, MD;  Location: [facility] OR;  Service: Vascular;  Laterality: Right;   AMPUTATION Right 09/12/2016   Procedure: AMPUTATION ABOVE KNEE;  Surgeon: Nadara Mustard, MD;  Location: [facility] OR;  Service: Orthopedics;  Laterality: Right;   AMPUTATION Left 08/12/2016  Procedure: LEFT BELOW KNEE AMPUTATION;  Surgeon: Nadara Mustard, MD;  Location: [facility] OR;  Service: Orthopedics;  Laterality: Left;   AMPUTATION Left 11/01/2017   Procedure: LEFT ABOVE KNEE AMPUTATION;  Surgeon: Nadara Mustard, MD;  Location: [facility] OR;  Service: Orthopedics;  Laterality: Left;   APPLICATION OF WOUND VAC Right 09/12/2016   Procedure: APPLICATION OF WOUND VAC ABOVE KNEE;  Surgeon: Nadara Mustard, MD;  Location: [facility] OR;  Service: Orthopedics;  Laterality: Right;   BIOPSY  01/13/2021   Procedure: BIOPSY;  Surgeon: Malissa Hippo, MD;   Location: [facility] ENDO SUITE;  Service: Endoscopy;;  esophageal   COLONOSCOPY WITH PROPOFOL N/A 01/13/2021   Procedure: COLONOSCOPY WITH PROPOFOL;  Surgeon: Malissa Hippo, MD;  [facility]: [facility] END[facility] SUITE;  Service: Endoscopy;  Laterality: N/A;   CORONARY PRESSURE/FFR STUDY N/A 05/09/2022   Procedure: INTRAVASCULAR PRESSURE WIRE/FFR STUDY;  Surgeon: Yvonne Kendall, MD;  Location: [facility] INVASIVE CV LAB;  Service: Cardiovascular;  Laterality: N/A;   CORONARY STENT INTERVENTION N/A 05/09/2022   Procedure: CORONARY STENT INTERVENTION;  Surgeon: Yvonne Kendall, MD;  Location: [facility] INVASIVE CV LAB;  Service: Cardiovascular;  Laterality: N/A;   ESOPHAGOGASTRODUODENOSCOPY (EGD) WITH PROPOFOL N/A 01/13/2021   Procedure: ESOPHAGOGASTRODUODENOSCOPY (EGD) WITH PROPOFOL;  Surgeon: Malissa Hippo, MD;  [facility];  Service: Endoscopy;  Laterality: N/A;   LEFT HEART CATH AND CORONARY ANGIOGRAPHY N/A 05/09/2022   Procedure: LEFT HEART CATH AND CORONARY ANGIOGRAPHY;  Surgeon: Yvonne Kendall, MD;  Location: [facility] INVASIVE CV LAB;  Service: Cardiovascular;  Laterality: N/A;   LOWER EXTREMITY ANGIOGRAPHY Bilateral 08/11/2016   Procedure: Lower Extremity Angiography;  Surgeon: Maeola Harman, MD;  Location: [facility] INVASIVE CV LAB;  Service: Cardiovascular;  Laterality: Bilateral;   PERIPHERAL VASCULAR BALLOON ANGIOPLASTY Left 08/11/2016   Procedure: Peripheral Vascular Balloon Angioplasty;  Surgeon: Maeola Harman, MD;  Location: [facility] INVASIVE CV LAB;  Service: Cardiovascular;  Laterality: Left;  SFA   POLYPECTOMY  01/13/2021   Procedure: POLYPECTOMY;  Surgeon: Malissa Hippo, MD;  [facility];  Service: Endoscopy;;   THYROID SURGERY      SOCIAL HISTORY:  Social History   Socioeconomic History   Marital status: Single    Spouse name: Not on file   Number of children: Not on file   Years of education: Not on file   Highest education level: Not on file  Occupational History    Occupation: retired    Comment: drove a Glass blower/designer  Tobacco Use   Smoking status: Every Day    Packs/day: 0.25    Years: 45.00    Additional pack years: 0.00    Total pack years: 11.25    Types: Cigarettes    Last attempt to quit: 11/12/2014    Years since quitting: 7.9    Passive exposure: Never   Smokeless tobacco: Never  Vaping Use   Vaping Use: Never used  Substance and Sexual Activity   Alcohol use: Not Currently    Alcohol/week: 2.0 standard drinks of alcohol    Types: 1 Glasses of wine, 1 Cans of beer per week   Drug use: Not Currently    Types: Cocaine   Sexual activity: Not Currently  Other Topics Concern   Not on file  Social History Narrative   09/23/20 - Lives alone, uses a wheelchair, double amputee, not married, no children - HHA comes in 3x per week to help him bathe and set up weekly meds.   Social Determinants of Health  Financial Resource Strain: Low Risk  (09/23/2020)   Overall Financial Resource Strain (CARDIA)    Difficulty of Paying Living Expenses: Not very hard  Food Insecurity: No Food Insecurity (05/16/2022)   Hunger Vital Sign    Worried About Running Out of Food in the Last Year: Never true    Ran Out of Food in the Last Year: Never true  Transportation Needs: No Transportation Needs (05/04/2022)   PRAPARE - Administrator, Civil Service (Medical): No    Lack of Transportation (Non-Medical): No  Physical Activity: Inactive (09/23/2020)   Exercise Vital Sign    Days of Exercise per Week: 0 days    Minutes of Exercise per Session: 0 min  Stress: No Stress Concern Present (09/23/2020)   [employer] of Occupational Health - Occupational Stress Questionnaire    Feeling of Stress : Only a little  Social Connections: Socially Isolated (09/23/2020)   Social Connection and Isolation Panel [NHANES]    Frequency of Communication with Friends and Family: More than three times a week    Frequency of Social Gatherings with Friends and Family:  More than three times a week    Attends Religious Services: Never    Database administrator or Organizations: No    Attends Banker Meetings: Never    Marital Status: Never married  Intimate Partner Violence: Not At Risk (05/04/2022)   Humiliation, Afraid, Rape, and Kick questionnaire    Fear of Current or Ex-Partner: No    Emotionally Abused: No    Physically Abused: No    Sexually Abused: No    FAMILY HISTORY:  Family History  Problem Relation Age of Onset   Heart disease Mother    Pneumonia Father    Diabetes Maternal Aunt    Diabetes Maternal Uncle     CURRENT MEDICATIONS:  Outpatient Encounter Medications as of 10/26/2022  Medication Sig Note   acetaminophen (TYLENOL) 325 MG tablet Take 2 tablets (650 mg total) by mouth every 6 (six) hours as needed for mild pain (or Fever >/= 101). (Patient taking differently: Take 650 mg by mouth every 6 (six) hours as needed for moderate pain.)    allopurinol (ZYLOPRIM) 100 MG tablet Take 1 tablet (100 mg total) by mouth daily. (Patient taking differently: Take 100 mg by mouth in the morning.)    apixaban (ELIQUIS) 5 MG TABS tablet Take 1 tablet (5 mg total) by mouth 2 (two) times daily.    Ascorbic Acid 500 MG CAPS Take 500 mg by mouth daily.    atorvastatin (LIPITOR) 80 MG tablet Take 1 tablet (80 mg total) by mouth daily.    calcium carbonate (TUMS - DOSED IN MG ELEMENTAL CALCIUM) 500 MG chewable tablet Chew 1,000 mg by mouth 3 (three) times daily before meals.    Cholecalciferol (VITAMIN D3) 50 MCG (2000 UT) capsule Take 2,000 Units by mouth in the morning.    clopidogrel (PLAVIX) 75 MG tablet Take 1 tablet (75 mg total) by mouth daily with breakfast.    cyanocobalamin (V-R VITAMIN B-12) 500 MCG tablet Take 1 tablet (500 mcg total) by mouth in the morning.    FIASP FLEXTOUCH 100 UNIT/ML FlexTouch Pen Inject 0-6 Units into the skin 3 (three) times daily before meals. Per SSI 10/17/2022: Sliding Scale   folic acid (FOLVITE) 1 MG  tablet Take 1 mg by mouth in the morning.    Glucagon, rDNA, (GLUCAGON EMERGENCY) 1 MG KIT Inject 1 mg into the muscle as needed (  hypoglycemia). 05/04/2022: Listed PRN on MAR, no doses received in November 2023.   hydrALAZINE (APRESOLINE) 25 MG tablet Take 1 tablet (25 mg total) by mouth 3 (three) times daily.    isosorbide mononitrate (IMDUR) 60 MG 24 hr tablet Take 1 tablet (60 mg total) by mouth daily.    LANTUS SOLOSTAR 100 UNIT/ML Solostar Pen Inject 12 Units into the skin at bedtime.    levothyroxine (SYNTHROID) 75 MCG tablet Take 1 tablet (75 mcg total) by mouth daily before breakfast.    lidocaine (LIDODERM) 5 % Place 1 patch onto the skin daily. Remove & Discard patch within 12 hours or as directed by MD    metoprolol succinate (TOPROL-XL) 100 MG 24 hr tablet Take 1 tablet (100 mg total) by mouth daily.    mexiletine (MEXITIL) 150 MG capsule Take 1 capsule (150 mg total) by mouth every 8 (eight) hours.    mirtazapine (REMERON) 15 MG tablet Take 15 mg by mouth at bedtime.    Multiple Vitamin (MULTIVITAMIN WITH MINERALS) TABS tablet Take 1 tablet by mouth daily. (Patient taking differently: Take 1 tablet by mouth in the morning.)    Nutritional Supplements (NUTRITIONAL DRINK) LIQD Take 240 mLs by mouth daily at 12 noon. House Supplement    oxyCODONE (ROXICODONE) 5 MG immediate release tablet Take 1-2 tablets (5-10 mg total) by mouth every 6 (six) hours as needed for breakthrough pain or severe pain.    pantoprazole (PROTONIX) 40 MG tablet Take 1 tablet (40 mg total) by mouth 2 (two) times daily.    polyethylene glycol (MIRALAX / GLYCOLAX) 17 g packet Take 17 g by mouth daily. (Patient taking differently: Take 17 g by mouth in the morning.)    sertraline (ZOLOFT) 50 MG tablet Take 1 tablet (50 mg total) by mouth at bedtime.    sodium bicarbonate 650 MG tablet Take 650 mg by mouth 3 (three) times daily before meals.    thiamine 100 MG tablet Take 1 tablet (100 mg total) by mouth daily. (Patient  taking differently: Take 100 mg by mouth in the morning.)    No facility-administered encounter medications on file as of 10/26/2022.    ALLERGIES:  Allergies  Allergen Reactions   Lisinopril Other (See Comments)    Hyperkalemia / Renal failure    LABORATORY DATA:  I have reviewed the labs as listed.  CBC    Component Value Date/Time   WBC 9.8 10/19/2022 1051   RBC 4.22 10/19/2022 1051   HGB 11.5 (L) 10/19/2022 1051   HGB 9.1 (L) 05/18/2021 1110   HCT 36.0 (L) 10/19/2022 1051   HCT 27.5 (L) 05/18/2021 1110   PLT 562 (H) 10/19/2022 1051   PLT 706 (H) 05/18/2021 1110   MCV 85.3 10/19/2022 1051   MCV 84 05/18/2021 1110   MCH 27.3 10/19/2022 1051   MCHC 31.9 10/19/2022 1051   RDW 15.2 10/19/2022 1051   RDW 13.5 05/18/2021 1110   LYMPHSABS 2.3 10/19/2022 1051   LYMPHSABS 2.3 01/27/2020 0859   MONOABS 0.5 10/19/2022 1051   EOSABS 0.6 (H) 10/19/2022 1051   EOSABS 0.2 01/27/2020 0859   BASOSABS 0.0 10/19/2022 1051   BASOSABS 0.1 01/27/2020 0859      Latest Ref Rng & Units 10/19/2022   10:51 AM 10/14/2022   12:00 AM 10/06/2022   12:00 AM  CMP  Glucose 70 - 99 mg/dL 95     BUN 8 - 23 mg/dL 28  27     26  Creatinine 0.61 - 1.24 mg/dL 4.09  2.5     2.2      Sodium 135 - 145 mmol/L 142   145      Potassium 3.5 - 5.1 mmol/L 3.8   4.7      Chloride 98 - 111 mmol/L 111   119      CO2 22 - 32 mmol/L 21   21      Calcium 8.9 - 10.3 mg/dL 6.9  6.7     6.7      Total Protein 6.5 - 8.1 g/dL 7.5     Total Bilirubin 0.3 - 1.2 mg/dL 0.5     Alkaline Phos 38 - 126 U/L 266   284      AST 15 - 41 U/L 30   28      ALT 0 - 44 U/L 24   18         This result is from an external source.    DIAGNOSTIC IMAGING:  I have independently reviewed the relevant imaging and discussed with the patient.   WRAP UP:  All questions were answered. The patient knows to call the clinic with any problems, questions or concerns.  Medical decision making: ***  Time spent on visit: I spent ***  minutes counseling the patient face to face. The total time spent in the appointment was *** minutes and more than 50% was on counseling.  Carnella Guadalajara, PA-C  ***

## 2022-10-26 ENCOUNTER — Inpatient Hospital Stay (HOSPITAL_BASED_OUTPATIENT_CLINIC_OR_DEPARTMENT_OTHER): Payer: Medicare (Managed Care) | Admitting: Physician Assistant

## 2022-10-26 ENCOUNTER — Other Ambulatory Visit: Payer: Self-pay

## 2022-10-26 VITALS — BP 138/70 | HR 63 | Temp 97.7°F | Resp 16

## 2022-10-26 DIAGNOSIS — E538 Deficiency of other specified B group vitamins: Secondary | ICD-10-CM | POA: Diagnosis not present

## 2022-10-26 DIAGNOSIS — D75839 Thrombocytosis, unspecified: Secondary | ICD-10-CM

## 2022-10-26 DIAGNOSIS — N1832 Chronic kidney disease, stage 3b: Secondary | ICD-10-CM | POA: Diagnosis not present

## 2022-10-26 DIAGNOSIS — D631 Anemia in chronic kidney disease: Secondary | ICD-10-CM

## 2022-10-26 DIAGNOSIS — Z87891 Personal history of nicotine dependence: Secondary | ICD-10-CM | POA: Diagnosis not present

## 2022-10-26 DIAGNOSIS — D649 Anemia, unspecified: Secondary | ICD-10-CM

## 2022-10-26 DIAGNOSIS — D72828 Other elevated white blood cell count: Secondary | ICD-10-CM

## 2022-10-26 LAB — METHYLMALONIC ACID, SERUM: Methylmalonic Acid, Quantitative: 399 nmol/L — ABNORMAL HIGH (ref 0–378)

## 2022-10-26 NOTE — Patient Instructions (Signed)
[facility] **VISIT SUMMARY & IMPORTANT INSTRUCTIONS **   You were seen today by Rojelio Brenner PA-C for your anemia and elevated white blood cells and platelets.    ELEVATED WHITE BLOOD CELLS & PLATELETS The labs we checked did NOT show any signs of genetic mutation or blood cancer as the cause of your high white blood cells and platelets. These elevated levels are most likely "reactive," meaning that it is your body's response to underlying inflammation and chronic disease.  ANEMIA Your anemia is related to your chronic kidney disease. At this time, your anemia is mild and does not require any treatment.  We will continue to monitor it closely.  MEDICATIONS: CONTINUE taking vitamin B12 500 mcg daily  FOLLOW-UP APPOINTMENT: Office visit in 1 year, 1 week after labs  ** Thank you for trusting me with your healthcare!  I strive to provide all of my patients with quality care at each visit.  If you receive a survey for this visit, I would be so grateful to you for taking the time to provide feedback.  Thank you in advance!  ~ Danford Tat                   Dr. Doreatha Massed   &   Rojelio Brenner, PA-C   - - - - - - - - - - - - - - - - - -    Thank you for choosing [facility] at [facility] to provide your oncology and hematology care.  To afford each patient quality time with our provider, please arrive at least 15 minutes before your scheduled appointment time.   If you have a lab appointment with the Cancer Center please come in thru the Main Entrance and check in at the main information desk.  You need to re-schedule your appointment should you arrive 10 or more minutes late.  We strive to give you quality time with our providers, and arriving late affects you and other patients whose appointments are after yours.  Also, if you no show three or more times for appointments you may be dismissed from the clinic at the  providers discretion.     Again, thank you for choosing [facility].  Our hope is that these requests will decrease the amount of time that you wait before being seen by our physicians.       _____________________________________________________________  Should you have questions after your visit to [facility], please contact our office at [phone] and follow the prompts.  Our office hours are 8:00 a.m. and 4:30 p.m. Monday - Friday.  Please note that voicemails left after 4:00 p.m. may not be returned until the following business day.  We are closed weekends and major holidays.  You do have access to a nurse 24-7, just call the main number to the clinic [phone] and do not press any options, hold on the line and a nurse will answer the phone.    For prescription refill requests, have your pharmacy contact our office and allow 72 hours.

## 2022-10-31 LAB — TSH: TSH: 0.01 — AB (ref 0.41–5.90)

## 2022-11-10 ENCOUNTER — Telehealth (HOSPITAL_COMMUNITY): Payer: Self-pay | Admitting: *Deleted

## 2022-11-10 NOTE — Telephone Encounter (Signed)
Attempted to call patient's nursing home, [facility], regarding upcoming cardiac PET appointment. Left message on voicemail with name and callback number  Larey Brick RN Navigator Cardiac Imaging [facility] Heart and Vascular Services [phone] Office [phone] Cell

## 2022-11-11 ENCOUNTER — Telehealth (HOSPITAL_COMMUNITY): Payer: Self-pay | Admitting: *Deleted

## 2022-11-11 NOTE — Telephone Encounter (Signed)
Called nursing staff and spoke with staff. They verbalized understanding that patient is to only eat meat and water on Monday for his cardiac sarcoid test. He is to not eat after 5pm but can drink water. Patient to arrive at 7am.  Larey Brick RN Navigator Cardiac Imaging [facility] Heart and Vascular Services [phone] Office [phone] Cell

## 2022-11-15 ENCOUNTER — Ambulatory Visit (HOSPITAL_COMMUNITY): Admission: RE | Admit: 2022-11-15 | Payer: Medicare (Managed Care) | Source: Ambulatory Visit

## 2022-11-29 LAB — COMPREHENSIVE METABOLIC PANEL: eGFR: 39

## 2022-11-29 LAB — CBC AND DIFFERENTIAL
HCT: 33 — AB (ref 41–53)
Hemoglobin: 10.9 — AB (ref 13.5–17.5)

## 2022-11-29 LAB — LIPID PANEL
LDL Cholesterol: 27
Triglycerides: 107 (ref 40–160)

## 2022-11-29 LAB — CBC: RBC: 4.11 (ref 3.87–5.11)

## 2022-11-29 LAB — VITAMIN D 25 HYDROXY (VIT D DEFICIENCY, FRACTURES): Vit D, 25-Hydroxy: 54.9

## 2022-11-29 LAB — TSH: TSH: 0.23 — AB (ref 0.41–5.90)

## 2022-11-29 LAB — BASIC METABOLIC PANEL
BUN: 17 (ref 4–21)
Creatinine: 1.9 — AB (ref 0.6–1.3)
Glucose: 57

## 2022-11-29 LAB — HEMOGLOBIN A1C: Hemoglobin A1C: 5.5

## 2022-12-12 LAB — COMPREHENSIVE METABOLIC PANEL: Calcium: 7.8 — AB (ref 8.7–10.7)

## 2022-12-29 ENCOUNTER — Telehealth (HOSPITAL_COMMUNITY): Payer: Self-pay | Admitting: *Deleted

## 2022-12-29 NOTE — Telephone Encounter (Signed)
Calling patient about his upcoming MRI appointment. His sister answered the phone and states that he doesn't have any questions concerning the appointment.  Pt had the MRI in February of 2023.  Larey Brick RN Navigator Cardiac Imaging [facility] Heart and Vascular Services [phone] Office [phone] Cell

## 2022-12-30 ENCOUNTER — Other Ambulatory Visit: Payer: Self-pay | Admitting: Cardiology

## 2022-12-30 ENCOUNTER — Ambulatory Visit (HOSPITAL_COMMUNITY)
Admission: RE | Admit: 2022-12-30 | Discharge: 2022-12-30 | Disposition: A | Discharge status: Home / Self Care | Payer: Medicare (Managed Care) | Source: Ambulatory Visit | Attending: Cardiology | Admitting: Cardiology

## 2022-12-30 DIAGNOSIS — I472 Ventricular tachycardia, unspecified: Secondary | ICD-10-CM

## 2022-12-30 MED ORDER — GADOBUTROL 1 MMOL/ML IV SOLN
8.0000 mL | Freq: Once | INTRAVENOUS | Status: AC | PRN
  Administered 2022-12-30: 8 mL via INTRAVENOUS

## 2023-01-05 ENCOUNTER — Telehealth: Payer: Self-pay

## 2023-01-05 DIAGNOSIS — I429 Cardiomyopathy, unspecified: Secondary | ICD-10-CM

## 2023-01-05 DIAGNOSIS — I5022 Chronic systolic (congestive) heart failure: Secondary | ICD-10-CM

## 2023-01-05 DIAGNOSIS — I472 Ventricular tachycardia, unspecified: Secondary | ICD-10-CM

## 2023-01-05 NOTE — Telephone Encounter (Signed)
The patient's sister has been notified of the result and verbalized understanding.  All questions (if any) were answered. Frutoso Schatz, RN 01/05/2023 1:27 PM  PET scan has been ordered. Appointment has been scheduled.

## 2023-01-05 NOTE — Telephone Encounter (Signed)
-----   Message from Lanier Prude sent at 01/02/2023  8:37 AM EDT ----- MRI shows significantly reduced LV function.  Todd [name], can you get him back into clinic to discuss ICD implant. Can you also order him a cardiac PET to evaluate sarcoid?  Thanks!  Sheria Lang T. Lalla Brothers, MD, [facility], [facility] Cardiac Electrophysiology

## 2023-01-13 LAB — TSH: TSH: 100 — AB (ref 0.41–5.90)

## 2023-01-16 ENCOUNTER — Ambulatory Visit (HOSPITAL_COMMUNITY)
Admission: RE | Admit: 2023-01-16 | Discharge: 2023-01-16 | Disposition: A | Discharge status: Home / Self Care | Payer: Medicare (Managed Care) | Source: Ambulatory Visit | Attending: Physician Assistant | Admitting: Physician Assistant

## 2023-01-16 DIAGNOSIS — Z87891 Personal history of nicotine dependence: Secondary | ICD-10-CM | POA: Diagnosis present

## 2023-01-18 ENCOUNTER — Telehealth: Payer: Self-pay | Admitting: Nurse Practitioner

## 2023-01-18 ENCOUNTER — Encounter: Payer: Medicare (Managed Care) | Admitting: Nurse Practitioner

## 2023-01-18 DIAGNOSIS — Z794 Long term (current) use of insulin: Secondary | ICD-10-CM

## 2023-01-18 DIAGNOSIS — E039 Hypothyroidism, unspecified: Secondary | ICD-10-CM

## 2023-01-18 DIAGNOSIS — E1121 Type 2 diabetes mellitus with diabetic nephropathy: Secondary | ICD-10-CM

## 2023-01-18 DIAGNOSIS — I10 Essential (primary) hypertension: Secondary | ICD-10-CM

## 2023-01-18 DIAGNOSIS — E782 Mixed hyperlipidemia: Secondary | ICD-10-CM

## 2023-01-18 DIAGNOSIS — F17200 Nicotine dependence, unspecified, uncomplicated: Secondary | ICD-10-CM

## 2023-01-18 DIAGNOSIS — E559 Vitamin D deficiency, unspecified: Secondary | ICD-10-CM

## 2023-01-18 NOTE — Progress Notes (Signed)
Erroneous encounter

## 2023-01-18 NOTE — Telephone Encounter (Signed)
Patient came in today from his residence at [facility] for follow up in management in diabetes and hypothyroidism.  No glucose records were sent with patient and the [facility] that was sent did not show the medications he was previously prescribed by this office.  It also noted that his thyroid medication dosage was different than initially prescribed by our office as well.  The patient did bring recent labs, showing his thyroid function has been checked almost monthly for the last few months and multiple adjustments had been made to his medications.  My nurse did call [facility] to verify this information and was placed on hold for over 20 minutes.  It was confirmed that he was taken off insulin about 2 weeks ago and that he was on a different dose of Levothyroxine.  No attempts at communication from the facility to [place] were made to discuss these changes collaboratively.  We expressed concerns with safety regarding having 2 different providers managing the same chronic disease and ultimately decided to defer management back to the facility in which he resides.  I did talk to the patient about this today and he is in agreement and understands that we will no longer be in charge of his DM and hypothyroidism management moving forward.  I did write note to facility stating that no changes were made today on the basis of them handling his medical care and that they can reach out if a future need arises.

## 2023-01-26 ENCOUNTER — Ambulatory Visit: Payer: Medicare (Managed Care) | Attending: Cardiology | Admitting: Cardiology

## 2023-01-26 ENCOUNTER — Encounter: Payer: Self-pay | Admitting: Cardiology

## 2023-01-26 NOTE — Progress Notes (Deleted)
  Electrophysiology Office Follow up Visit Note:    Date:  01/26/2023   ID:  Hedwig Morton, DOB [birth year], MRN [id number]  PCP:  Sherol Dade, DO  [facility] Cardiologist:  Marjo Bicker, MD  [facility] Electrophysiologist:  Lanier Prude, MD    Interval History:    Todd [name] is a 67 y.o. male who presents for a follow up visit.   Last seen Oct 14, 2022 for syncope and ventricular tachycardia.  The patient was seen in the past for an ICD but was not a candidate because of active substance abuse and because of a reversible cause of his arrhythmia (LAD occlusion).  Cardiac sarcoidosis is also been considered in the past based on cardiac MRI findings.  He was previous on amiodarone but this was stopped and he was started on mexiletine.  At the last appointment, I repeated a cardiac MRI to reevaluate for cardiac sarcoidosis.     Past medical, surgical, social and family history were reviewed.  ROS:   Please see the history of present illness.    All other systems reviewed and are negative.  EKGs/Labs/Other Studies Reviewed:    The following studies were reviewed today:  December 30, 2022 cardiac MRI Global hypokinesis, worse in the basal septum Ejection fraction 31% Moderate LVH LGE in the mid myocardium, basal/mid septum and basal/mid anterior and inferior walls. ***       Physical Exam:    VS:  There were no vitals taken for this visit.    Wt Readings from Last 3 Encounters:  06/24/22 147 lb (66.7 kg)  05/14/22 149 lb 14.6 oz (68 kg)  05/09/22 149 lb 0.5 oz (67.6 kg)     GEN: *** Well nourished, well developed in no acute distress CARDIAC: ***RRR, no murmurs, rubs, gallops RESPIRATORY:  Clear to auscultation without rales, wheezing or rhonchi       ASSESSMENT:    No diagnosis found. PLAN:    In order of problems listed above:  #Chronic systolic heart failure #Mixed ischemic/nonischemic cardiomyopathy #Ventricular  tachycardia NYHA class II.  Warm and dry on exam. While he has a history of coronary artery disease with an LAD PCI, the LGE pattern on cardiac MRI suggested nonischemic driver of his cardiomyopathy.  I remain concerned about cardiac sarcoidosis and have ordered a cardiac PET to evaluate for active inflammation.  He should continue metoprolol hydralazine, Imdur.  He has an allergy to lisinopril.  I am discussed his risk for sudden cardiac death and have recommended a ICD.  ***ICD candidate? Plan for [facility] dual-chamber pacemaker.  #Ventricular tachycardia Cardiac PET as above ICD as above Continue mexiletine  Follow-up 6 months with APP.    Signed, Steffanie Dunn, MD, [facility], [facility] 01/26/2023 5:17 AM    Electrophysiology [facility]

## 2023-01-30 NOTE — Progress Notes (Signed)
Patient notified of LDCT Lung Cancer Screening Results via mail with the recommendation to follow-up in 12 months. Patient's referring provider has been sent a copy of results. Results are as follows:  IMPRESSION: 1. Lung-RADS 2S, benign appearance or behavior. Continue annual screening with low-dose chest CT without contrast in 12 months. 2. The "S" modifier above refers to potentially clinically significant non lung cancer related findings. Specifically, there is aortic atherosclerosis, in addition to three-vessel coronary artery disease. Please note that although the presence of coronary artery calcium documents the presence of coronary artery disease, the severity of this disease and any potential stenosis cannot be assessed on this non-gated CT examination. Assessment for potential risk factor modification, dietary therapy or pharmacologic therapy may be warranted, if clinically indicated. 3. Mild diffuse bronchial wall thickening with very mild centrilobular and paraseptal emphysema; imaging findings suggestive of underlying COPD.

## 2023-02-10 ENCOUNTER — Ambulatory Visit: Payer: Medicare (Managed Care) | Attending: Cardiology | Admitting: Cardiology

## 2023-02-10 ENCOUNTER — Encounter: Payer: Self-pay | Admitting: Cardiology

## 2023-02-10 VITALS — BP 110/62 | HR 58 | Ht 60.0 in

## 2023-02-10 DIAGNOSIS — I480 Paroxysmal atrial fibrillation: Secondary | ICD-10-CM

## 2023-02-10 DIAGNOSIS — I472 Ventricular tachycardia, unspecified: Secondary | ICD-10-CM

## 2023-02-10 DIAGNOSIS — I5022 Chronic systolic (congestive) heart failure: Secondary | ICD-10-CM

## 2023-02-10 NOTE — Patient Instructions (Addendum)
Medication Instructions:  The current medical regimen is effective;  continue present plan and medications.  *If you need a refill on your cardiac medications before your next appointment, please call your pharmacy*   Testing/Procedures: We will reach out about getting scheduled for the Cardiac PET-    Follow-Up: At [facility], you and your health needs are our priority.  As part of our continuing mission to provide you with exceptional heart care, we have created designated Provider Care Teams.  These Care Teams include your primary Cardiologist (physician) and Advanced Practice Providers (APPs -  Physician Assistants and Nurse Practitioners) who all work together to provide you with the care you need, when you need it.  We recommend signing up for the patient portal called "MyChart".  Sign up information is provided on this After Visit Summary.  MyChart is used to connect with patients for Virtual Visits (Telemedicine).  Patients are able to view lab/test results, encounter notes, upcoming appointments, etc.  Non-urgent messages can be sent to your provider as well.   To learn more about what you can do with MyChart, go to [email or web address].    Your next appointment:   3 month(s)  Provider:   Marjo Bicker, MD    6 months with Steffanie Dunn, MD    Other Instructions     Cardiac Sarcoidosis/Inflammation PET Scan Patient Instructions  Please report to Radiology at the [facility] Main Entrance 15 minutes early for your test.  [address] [place], [place] [address] BRING FOOD DIARY WITH YOU TO THIS APPOINTMENT For 24 hours before the test: Do not exercise! Do not eat after 5 pm the day before your test! To make sure that your scanning results are accurate, you MUST follow the sarcoid prep meal diet starting the day before your PET scan. This diet involves eating no carbohydrates 24 hours before the test.  You will keep a log of all that you  eat the day before your test. If you have questions or do not understand this diet, please call [phone] for more information. If you are unable to follow this diet, please discuss an alternative strategy with the coordinator.  If you are diabetic, continue your diabetes medications as usual on the day before until you begin to fast. NO DIABETES MEDICATIONS ONCE YOU BEGIN TO FAST. What foods can I eat the day before my test?  Drink only water or black coffee (WITHOUT sugar, artificial sweetener, cream, or milk). Eggs (prepared without milk or cheese)  Meat that is either broiled or pan fried in butter WITHOUT breading (chicken, [place], bacon, meat-only sausage, hamburger, steak, fish) Butter, salt & pepper What foods must I AVOID the day before my test?  Do not consume alcoholic beverages, sodas, fruit juice, coffee creamer, or sports drinks  Do not eat vegetables, beans, nuts, fruits, juices, bread, grains, rice, pasta, potatoes, or any baked goods Do not eat dairy products (milk, cheese, etc.)  Do not eat mayonnaise, ketchup, tartar sauce, mustard, or other condiments Do not add sugar, artificial sweeteners, or Splenda (sucralose) to foods or drinks  Do not eat breaded foods (like fried chicken)  Do not eat sweets, candy, gum, sweetened cough drops, lozenges, or sugar  Do not eat sweetened, grilled, or cured meats or meat with carbohydrate-containing additives (some sausages, ham, sweetened bacon) Suggested items for breakfast, lunch, or dinner:  Breakfast  3 to 5 fatty sausage links fried in butter. 3 to 5 bacon strips.  3 eggs pan fried in butter (no milk or cheese).  Lunch/Dinner  2 hamburger patties fried in butter. Chicken or fatty fish pan fried in butter. No breading. 8 oz. fatty steak pan fried in butter.  Beverages  Drink only water or black coffee. DO NOT ADD SUGAR, ARTIFICIAL SWEETENER, CREAM, OR MILK   For more information and frequently asked questions, please visit  our website : [email or web address]      Cardiac Sarcoidosis/Inflammation PET Scan  Food Diary Name: _____________________________ Please fill in EXACTLY what you have eaten and when for 24 hours PRIOR to your test date.  Time Food/Drink Comments  Breakfast                Lunch                Dinner                Snacks                 DO NOT EXERCISE THE DAY BEFORE YOUR TEST DO NOT EAT AFTER 5 PM THE DAY BEFORE YOUR TEST.  ON THE DAY OF YOUR TEST, DO NOT EAT ANY FOOD AND ONLY DRINK CLEAR WATER! PLEASE BRING THIS FOOD DIARY WITH YOU TO YOUR APPOINTMENT

## 2023-02-10 NOTE — Progress Notes (Signed)
Electrophysiology Office Follow up Visit Note:    Date:  02/10/2023   ID:  Todd [name], DOB 11-22-1955, MRN [id number]  PCP:  Sherol Dade, DO  [facility] Cardiologist:  Marjo Bicker, MD  [facility] Electrophysiologist:  Lanier Prude, MD    Interval History:    Todd [name] is a 67 y.o. male who presents for a follow up visit.   Last seen Oct 14, 2022 for syncope and ventricular tachycardia.  The patient was seen in the past for an ICD but was not a candidate because of active substance abuse and because of a reversible cause of his arrhythmia (LAD occlusion).  Cardiac sarcoidosis is also been considered in the past based on cardiac MRI findings.  He was previous on amiodarone but this was stopped and he was started on mexiletine.  At the last appointment, I repeated a cardiac MRI to reevaluate for cardiac sarcoidosis.  He is without complaint today.  He is been taking his medications according to his facility log.  No syncopal episodes.  No palpitations.  He uses a wheelchair 100% of the time.  He uses both arms to make transfers from his bed to his wheelchair.     Past medical, surgical, social and family history were reviewed.  ROS:   Please see the history of present illness.    All other systems reviewed and are negative.  EKGs/Labs/Other Studies Reviewed:    The following studies were reviewed today:  December 30, 2022 cardiac MRI Global hypokinesis, worse in the basal septum Ejection fraction 31% Moderate LVH LGE in the mid myocardium, basal/mid septum and basal/mid anterior and inferior walls.   EKG Interpretation Date/Time:  Friday February 10 2023 10:12:48 EDT Ventricular Rate:  58 PR Interval:  200 QRS Duration:  102 QT Interval:  504 QTC Calculation: 494 R Axis:   23  Text Interpretation: Sinus bradycardia Confirmed by Steffanie Dunn [id number] on 02/10/2023 10:32:19 AM    Physical Exam:    VS:  BP 110/62   Pulse (!) 58    Ht 5' (1.524 m)   SpO2 99%   BMI 28.71 kg/m     Wt Readings from Last 3 Encounters:  06/24/22 147 lb (66.7 kg)  05/14/22 149 lb 14.6 oz (68 kg)  05/09/22 149 lb 0.5 oz (67.6 kg)     GEN:  Well nourished, well developed in no acute distress CARDIAC: RRR, no murmurs, rubs, gallops. RESPIRATORY:  Clear to auscultation without rales, wheezing or rhonchi       ASSESSMENT:    1. V-tach (HCC)   2. Chronic systolic heart failure (HCC)   3. PAF (paroxysmal atrial fibrillation) (HCC)    PLAN:    In order of problems listed above:  #Chronic systolic heart failure #Mixed ischemic/nonischemic cardiomyopathy #Ventricular tachycardia NYHA class II.  Warm and dry on exam. While he has a history of coronary artery disease with an LAD PCI, the LGE pattern on cardiac MRI suggested nonischemic driver of his cardiomyopathy.  I remain concerned about cardiac sarcoidosis and have ordered a cardiac PET to evaluate for active inflammation.  This has not been completed.  He should continue metoprolol hydralazine, Imdur.  He has an allergy to lisinopril.  I am discussed his risk for sudden cardiac death today.  ICD has been discussed by different providers in the past.  He has previously been deemed not a candidate for defibrillator therapy.  I discussed ICD therapy again with the patient today.  During our  discussion we discussed the recovery after an ICD implant including arm restrictions.  Given his bilateral lower extremity amputations, need for wheelchair for mobility, he is very hesitant to pursue ICD which is very understandable.    He should get his PET/CT.  He will see his general cardiologist in 3 months and I will see him back in about 6 months.   #Ventricular tachycardia Cardiac PET as above Continue mexiletine  #Paroxysmal atrial fibrillation Continue Eliquis Last EKG on May 14, 2022 shows sinus rhythm.    Signed, Steffanie Dunn, MD, [facility], [facility] 02/10/2023 10:32 AM     Electrophysiology [facility]

## 2023-03-06 ENCOUNTER — Telehealth (HOSPITAL_COMMUNITY): Payer: Self-pay | Admitting: *Deleted

## 2023-03-06 NOTE — Telephone Encounter (Signed)
Attempted to call [facility] regarding upcoming cardiac PET appointment. Left message on voicemail with name and callback number  Larey Brick RN Navigator Cardiac Imaging [facility] [phone] Office [phone] Cell

## 2023-03-07 ENCOUNTER — Telehealth (HOSPITAL_COMMUNITY): Payer: Self-pay | Admitting: *Deleted

## 2023-03-07 ENCOUNTER — Other Ambulatory Visit (HOSPITAL_COMMUNITY): Payer: Medicare (Managed Care)

## 2023-03-07 NOTE — Telephone Encounter (Signed)
Second attempt to reach out to nursing facility to ensure they were aware of the patient's diet prep.  Left message on voicemail.  Larey Brick RN Navigator Cardiac Imaging [facility] [phone] Office [phone] Cell

## 2023-03-08 ENCOUNTER — Encounter: Payer: Self-pay | Admitting: Internal Medicine

## 2023-03-08 ENCOUNTER — Ambulatory Visit (HOSPITAL_COMMUNITY)
Admission: RE | Admit: 2023-03-08 | Discharge: 2023-03-08 | Disposition: A | Discharge status: Home / Self Care | Payer: Medicare (Managed Care) | Source: Ambulatory Visit | Attending: Cardiology | Admitting: Cardiology

## 2023-03-08 ENCOUNTER — Ambulatory Visit: Payer: Medicare (Managed Care) | Attending: Internal Medicine | Admitting: Internal Medicine

## 2023-03-08 VITALS — BP 130/70 | HR 68 | Ht 60.0 in

## 2023-03-08 DIAGNOSIS — I480 Paroxysmal atrial fibrillation: Secondary | ICD-10-CM | POA: Insufficient documentation

## 2023-03-08 DIAGNOSIS — I472 Ventricular tachycardia, unspecified: Secondary | ICD-10-CM | POA: Diagnosis present

## 2023-03-08 DIAGNOSIS — I5042 Chronic combined systolic (congestive) and diastolic (congestive) heart failure: Secondary | ICD-10-CM

## 2023-03-08 DIAGNOSIS — I5022 Chronic systolic (congestive) heart failure: Secondary | ICD-10-CM | POA: Diagnosis not present

## 2023-03-08 DIAGNOSIS — I429 Cardiomyopathy, unspecified: Secondary | ICD-10-CM | POA: Diagnosis not present

## 2023-03-08 DIAGNOSIS — E7849 Other hyperlipidemia: Secondary | ICD-10-CM

## 2023-03-08 DIAGNOSIS — Z720 Tobacco use: Secondary | ICD-10-CM

## 2023-03-08 DIAGNOSIS — I2510 Atherosclerotic heart disease of native coronary artery without angina pectoris: Secondary | ICD-10-CM

## 2023-03-08 DIAGNOSIS — I70208 Unspecified atherosclerosis of native arteries of extremities, other extremity: Secondary | ICD-10-CM | POA: Diagnosis not present

## 2023-03-08 LAB — NM PET CT MYOCARDIAL SARCOIDOSIS
Nuc Stress EF: 38 %
Rest Nuclear Isotope Dose: 17.1 mCi

## 2023-03-08 MED ORDER — RUBIDIUM RB82 GENERATOR (RUBYFILL)
17.1000 | PACK | Freq: Once | INTRAVENOUS | Status: AC
  Administered 2023-03-08: 17.1 via INTRAVENOUS

## 2023-03-08 MED ORDER — FLUDEOXYGLUCOSE F - 18 (FDG) INJECTION
9.1000 | Freq: Once | INTRAVENOUS | Status: AC
  Administered 2023-03-08: 9.1 via INTRAVENOUS

## 2023-03-08 NOTE — Patient Instructions (Signed)
Medication Instructions:  Your physician has recommended you make the following change in your medication:   -Stop Plavix 05/13/2023. -Continue all other medications  *If you need a refill on your cardiac medications before your next appointment, please call your pharmacy*   Lab Work: None If you have labs (blood work) drawn today and your tests are completely normal, you will receive your results only by: MyChart Message (if you have MyChart) OR A paper copy in the mail If you have any lab test that is abnormal or we need to change your treatment, we will call you to review the results.   Testing/Procedures: None   Follow-Up: At [facility], you and your health needs are our priority.  As part of our continuing mission to provide you with exceptional heart care, we have created designated Provider Care Teams.  These Care Teams include your primary Cardiologist (physician) and Advanced Practice Providers (APPs -  Physician Assistants and Nurse Practitioners) who all work together to provide you with the care you need, when you need it.  We recommend signing up for the patient portal called "MyChart".  Sign up information is provided on this After Visit Summary.  MyChart is used to connect with patients for Virtual Visits (Telemedicine).  Patients are able to view lab/test results, encounter notes, upcoming appointments, etc.  Non-urgent messages can be sent to your provider as well.   To learn more about what you can do with MyChart, go to [email or web address].    Your next appointment:   6 month(s)  Provider:   You may see Vishnu P Mallipeddi, MD or one of the following Advanced Practice Providers on your designated Care Team:   [place], PA-C  Jacolyn Reedy, [place]     Other Instructions

## 2023-03-08 NOTE — Progress Notes (Signed)
Cardiology Office Note  Date: 03/08/2023   ID: Todd [name], DOB [birth year], MRN [id number]  PCP:  Sherol Dade, DO  Cardiologist:  Marjo Bicker, MD Electrophysiologist:  Lanier Prude, MD    History of Present Illness: Todd [name] is a 67 y.o. male known to have CAD manifested by V. tach in 04/2022 s/p LAD PCI with LVEF 45 to 50%, sustained V. tach secondary to cocaine use in 04/2021 (followed by EP, not a candidate for ICD due to polysubstance abuse), prolonged QT interval secondary to amiodarone use (currently on mexiletine), concern for sarcoidosis on documentation (per cardiac MRI), paroxysmal A-fib on Eliquis, HTN, IDDM, HLD, bilateral lower extremity amputation secondary to poorly controlled DM2, CKD presented to the cardiology clinic for follow-up visit.  Patient had sustained V. tach in 04/2021 with LVEF 45 to 50% but LHC was deferred due to CKD. V. tach was deemed to be from cocaine use. He was again readmitted 04/2022 with a different reason but was found to be in V. tach, underwent LHC and showed severe stenosis of mid LAD, underwent PCI. He was seen by EP for ICD candidacy but deferred due to polysubstance abuse. Patient is wheelchair-bound, currently lives in a facility.  Awaiting cardiac PET to rule out cardiac sarcoidosis.  PET scan is performed today, pending report.  No symptoms, overall doing great.  No angina, DOE, orthopnea, PND, presyncope, syncope.  No exertional dizziness.  No weakness in bilateral upper extremities.  Cut down smoking from 3 cigarettes to 2 cigarettes/day.  Last use of cocaine was couple of years ago.   Past Medical History:  Diagnosis Date   AKI (acute kidney injury) (HCC)    Alcohol abuse    Cocaine abuse (HCC) 01/25/2021   Constipated    Diabetes mellitus without complication (HCC)    Diarrhea    Elevated LFTs    Goiter    Gout    Hyperlipidemia    Hypertension    Leukocytosis    Reactive thrombocytosis     Right BKA infection (HCC) 08/2016   Right leg pain    Sepsis due to undetermined organism [facility])    Thyroid disease    Wound infection after surgery 08/2016    Past Surgical History:  Procedure Laterality Date   ABDOMINAL AORTOGRAM N/A 08/11/2016   Procedure: Abdominal Aortogram;  Surgeon: Maeola Harman, MD;  Location: [facility] INVASIVE CV LAB;  Service: Cardiovascular;  Laterality: N/A;   ABDOMINAL AORTOGRAM W/LOWER EXTREMITY N/A 08/15/2016   Procedure: Abdominal Aortogram w/Lower Extremity;  Surgeon: Sherren Kerns, MD;  Location: [facility] INVASIVE CV LAB;  Service: Cardiovascular;  Laterality: N/A;   AMPUTATION Right 08/17/2016   Procedure: RIGHT BELOW KNEE AMPUTATION;  Surgeon: Sherren Kerns, MD;  Location: [facility] OR;  Service: Vascular;  Laterality: Right;   AMPUTATION Right 09/12/2016   Procedure: AMPUTATION ABOVE KNEE;  Surgeon: Nadara Mustard, MD;  Location: [facility] OR;  Service: Orthopedics;  Laterality: Right;   AMPUTATION Left 08/12/2016   Procedure: LEFT BELOW KNEE AMPUTATION;  Surgeon: Nadara Mustard, MD;  Location: [facility] OR;  Service: Orthopedics;  Laterality: Left;   AMPUTATION Left 11/01/2017   Procedure: LEFT ABOVE KNEE AMPUTATION;  Surgeon: Nadara Mustard, MD;  Location: [facility] OR;  Service: Orthopedics;  Laterality: Left;   APPLICATION OF WOUND VAC Right 09/12/2016   Procedure: APPLICATION OF WOUND VAC ABOVE KNEE;  Surgeon: Nadara Mustard, MD;  Location: [facility] OR;  Service: Orthopedics;  Laterality: Right;   BIOPSY  01/13/2021   Procedure: BIOPSY;  Surgeon: Malissa Hippo, MD;  Location: [facility] ENDO SUITE;  Service: Endoscopy;;  esophageal   COLONOSCOPY WITH PROPOFOL N/A 01/13/2021   Procedure: COLONOSCOPY WITH PROPOFOL;  Surgeon: Malissa Hippo, MD;  [facility]: [facility];  Service: Endoscopy;  Laterality: N/A;   CORONARY PRESSURE/FFR STUDY N/A 05/09/2022   Procedure: INTRAVASCULAR PRESSURE WIRE/FFR STUDY;  Surgeon: Yvonne Kendall, MD;  Location: [facility] INVASIVE CV LAB;  Service: Cardiovascular;   Laterality: N/A;   CORONARY STENT INTERVENTION N/A 05/09/2022   Procedure: CORONARY STENT INTERVENTION;  Surgeon: Yvonne Kendall, MD;  Location: [facility] INVASIVE CV LAB;  Service: Cardiovascular;  Laterality: N/A;   ESOPHAGOGASTRODUODENOSCOPY (EGD) WITH PROPOFOL N/A 01/13/2021   Procedure: ESOPHAGOGASTRODUODENOSCOPY (EGD) WITH PROPOFOL;  Surgeon: Malissa Hippo, MD;  [facility];  Service: Endoscopy;  Laterality: N/A;   LEFT HEART CATH AND CORONARY ANGIOGRAPHY N/A 05/09/2022   Procedure: LEFT HEART CATH AND CORONARY ANGIOGRAPHY;  Surgeon: Yvonne Kendall, MD;  Location: [facility] INVASIVE CV LAB;  Service: Cardiovascular;  Laterality: N/A;   LOWER EXTREMITY ANGIOGRAPHY Bilateral 08/11/2016   Procedure: Lower Extremity Angiography;  Surgeon: Maeola Harman, MD;  Location: [facility] INVASIVE CV LAB;  Service: Cardiovascular;  Laterality: Bilateral;   PERIPHERAL VASCULAR BALLOON ANGIOPLASTY Left 08/11/2016   Procedure: Peripheral Vascular Balloon Angioplasty;  Surgeon: Maeola Harman, MD;  Location: [facility] INVASIVE CV LAB;  Service: Cardiovascular;  Laterality: Left;  SFA   POLYPECTOMY  01/13/2021   Procedure: POLYPECTOMY;  Surgeon: Malissa Hippo, MD;  [facility];  Service: Endoscopy;;   THYROID SURGERY      Current Outpatient Medications  Medication Sig Dispense Refill   acetaminophen (TYLENOL) 325 MG tablet Take 2 tablets (650 mg total) by mouth every 6 (six) hours as needed for mild pain (or Fever >/= 101). (Patient taking differently: Take 650 mg by mouth every 6 (six) hours as needed for moderate pain.)     allopurinol (ZYLOPRIM) 100 MG tablet Take 1 tablet (100 mg total) by mouth daily. (Patient taking differently: Take 100 mg by mouth in the morning.) 30 tablet 1   apixaban (ELIQUIS) 5 MG TABS tablet Take 1 tablet (5 mg total) by mouth 2 (two) times daily. 60 tablet 5   Ascorbic Acid 500 MG CAPS Take 500 mg by mouth daily.     atorvastatin (LIPITOR) 80 MG tablet Take 1  tablet (80 mg total) by mouth daily. 90 tablet 3   calcium carbonate (TUMS - DOSED IN MG ELEMENTAL CALCIUM) 500 MG chewable tablet Chew 1,000 mg by mouth 3 (three) times daily before meals.     Cholecalciferol (VITAMIN D3) 50 MCG (2000 UT) capsule Take 2,000 Units by mouth in the morning.     clopidogrel (PLAVIX) 75 MG tablet Take 1 tablet (75 mg total) by mouth daily with breakfast. 90 tablet 3   cyanocobalamin (V-R VITAMIN B-12) 500 MCG tablet Take 1 tablet (500 mcg total) by mouth in the morning. 30 tablet 6   FIASP FLEXTOUCH 100 UNIT/ML FlexTouch Pen Inject 0-6 Units into the skin 3 (three) times daily before meals. Per SSI     folic acid (FOLVITE) 1 MG tablet Take 1 mg by mouth in the morning.     Glucagon, rDNA, (GLUCAGON EMERGENCY) 1 MG KIT Inject 1 mg into the muscle as needed (hypoglycemia).     hydrALAZINE (APRESOLINE) 25 MG tablet Take 1 tablet (25 mg total) by mouth 3 (three) times daily. 270 tablet 3  insulin lispro (HUMALOG KWIKPEN) 100 UNIT/ML KwikPen Inject into the skin.     isosorbide mononitrate (IMDUR) 60 MG 24 hr tablet Take 1 tablet (60 mg total) by mouth daily. 90 tablet 3   LANTUS SOLOSTAR 100 UNIT/ML Solostar Pen Inject 12 Units into the skin at bedtime.     levothyroxine (SYNTHROID) 75 MCG tablet Take 1 tablet (75 mcg total) by mouth daily before breakfast. 90 tablet 3   lidocaine (LIDODERM) 5 % Place 1 patch onto the skin daily. Remove & Discard patch within 12 hours or as directed by MD 30 patch 0   metoprolol succinate (TOPROL-XL) 100 MG 24 hr tablet Take 1 tablet (100 mg total) by mouth daily. 90 tablet 3   mexiletine (MEXITIL) 150 MG capsule Take 1 capsule (150 mg total) by mouth every 8 (eight) hours.     mirtazapine (REMERON) 15 MG tablet Take 15 mg by mouth at bedtime.     Multiple Vitamin (MULTIVITAMIN WITH MINERALS) TABS tablet Take 1 tablet by mouth daily. (Patient taking differently: Take 1 tablet by mouth in the morning.)     Nutritional Supplements  (NUTRITIONAL DRINK) LIQD Take 240 mLs by mouth daily at 12 noon. House Supplement     oxyCODONE (ROXICODONE) 5 MG immediate release tablet Take 1-2 tablets (5-10 mg total) by mouth every 6 (six) hours as needed for breakthrough pain or severe pain. 10 tablet 0   pantoprazole (PROTONIX) 40 MG tablet Take 1 tablet (40 mg total) by mouth 2 (two) times daily.     polyethylene glycol (MIRALAX / GLYCOLAX) 17 g packet Take 17 g by mouth daily. (Patient taking differently: Take 17 g by mouth in the morning.) 14 each 0   sertraline (ZOLOFT) 50 MG tablet Take 1 tablet (50 mg total) by mouth at bedtime. 30 tablet 1   sodium bicarbonate 650 MG tablet Take 650 mg by mouth 3 (three) times daily before meals.     thiamine 100 MG tablet Take 1 tablet (100 mg total) by mouth daily. (Patient taking differently: Take 100 mg by mouth in the morning.) 30 tablet 3   No current facility-administered medications for this visit.   Allergies:  Lisinopril   Social History: The patient  reports that he has been smoking cigarettes. He started smoking about 53 years ago. He has a 11.3 pack-year smoking history. He has never been exposed to tobacco smoke. He has never used smokeless tobacco. He reports that he does not currently use alcohol after a past usage of about 2.0 standard drinks of alcohol per week. He reports that he does not currently use drugs after having used the following drugs: Cocaine.   Family History: The patient's family history includes Diabetes in his maternal aunt and maternal uncle; Heart disease in his mother; Pneumonia in his father.   ROS:  Please see the history of present illness. Otherwise, complete review of systems is positive for none.  All other systems are reviewed and negative.   Physical Exam: VS:  BP 130/70   Pulse 68   Ht 5' (1.524 m) Comment: Not able to stand  SpO2 99%   BMI 28.71 kg/m , BMI Body mass index is 28.71 kg/m.  Wt Readings from Last 3 Encounters:  06/24/22 147 lb  (66.7 kg)  05/14/22 149 lb 14.6 oz (68 kg)  05/09/22 149 lb 0.5 oz (67.6 kg)    General: Patient appears comfortable at rest. HEENT: Conjunctiva and lids normal, oropharynx clear with moist mucosa. Neck: Supple,  no elevated JVP or carotid bruits, no thyromegaly. Lungs: Clear to auscultation, nonlabored breathing at rest. Cardiac: Regular rate and rhythm, no S3 or significant systolic murmur, no pericardial rub. Abdomen: Soft, nontender, no hepatomegaly, bowel sounds present, no guarding or rebound. Extremities: No pitting edema, bilateral lower EXTR amputation present, right radial pulse absent Skin: Warm and dry. Musculoskeletal: No kyphosis. Neuropsychiatric: Alert and oriented x3, affect grossly appropriate.  ECG: NSR in 05/2022  Recent Labwork: 05/17/2022: Magnesium 1.8 10/19/2022: ALT 24; AST 30; Platelets 562; Potassium 3.8; Sodium 142 11/29/2022: BUN 17; Creatinine 1.9; Hemoglobin 10.9 01/13/2023: TSH 100.00     Component Value Date/Time   CHOL 89 10/06/2022 0000   CHOL 59 (L) 09/22/2020 0923   TRIG 107 11/29/2022 0000   HDL 28 (A) 10/06/2022 0000   HDL 25 (L) 09/22/2020 0923   CHOLHDL 2.1 05/06/2022 0416   VLDL 13 05/06/2022 0416   LDLCALC 27 11/29/2022 0000   LDLCALC 19 09/22/2020 0923    Assessment and Plan: Patient is a 67 year old M known to have CAD manifested by V. tach in 04/2022 s/p LAD PCI with LVEF 45 to 50%, sustained V. tach secondary to cocaine use in 04/2021 (followed by EP, not a candidate for ICD due to polysubstance abuse), prolonged QT interval secondary to amiodarone use (currently on mexiletine), concern for sarcoidosis on documentation (per cardiac MRI), paroxysmal A-fib on Eliquis, HTN, IDDM, HLD, bilateral lower extremity amputation secondary to poorly controlled DM2, CKD presented to the cardiology clinic for follow-up visit.  # CAD manifested by V. tach in 04/2022 s/p LAD PCI with LVEF 45 to 50%, currently angina free # Cardiomyopathy with LVEF 45 to  50%, currently compensated -Continue Plavix 75 mg once daily with Eliquis 5 mg twice daily for total duration of 1 year, Plavix will be discontinued on 05/13/2023.  Eliquis will need to be continued throughout the life. -Continue atorvastatin 80 mg nightly. -Continue metoprolol succinate 100 mg once daily. -Not on ACE/ARB due to CKD -ER precautions for chest pain provided  # Right upper extremity PAD -Patient had absent R radial pulse after cardiac cath in 04/2022. Patient has fever R radial pulse with intact sensory and motor strength today in the clinic. Ultrasound arterial Doppler of the right upper extremity showed turbulent waveforms to proximal R SCA suggestive of more proximal obstruction. R radial artery was patent with some degree of calcification dampened waveforms compared to our ulnar artery. No weakness or numbness in the right upper extremity. Has 5/5 motor strength in the R UE.  No exertional dizziness.  Will monitor for any new development of symptoms.  # HLD, at goal -Continue atorvastatin 80 mg nightly.  Goal LDL less than 55.  # History of V. tach -Patient had sustained V. tach in 04/2021 likely from cocaine use/ischemia but LHC was deferred due to CKD. Cardiac MRI was performed in 07/2021 that showed LGE in the basal septum, basal anterior wall, mid anteroseptal wall and mid inferior wall in the mid myocardium. Due to VT history, cardiac PET was indicated to rule out sarcoidosis. He had a recurrent sustained V. tach episode in 04/2022 which was when [facility] was performed and showed severe stenosis of mid LAD and underwent PCI. -V. tach most likely secondary to cocaine use and ischemia however cardiac sarcoidosis cannot be completely ruled out.  Cardiac PET scan is performed today, report pending. -Continue mexiletine 150 mg every 8 hours (of amiodarone due to prolonged QTc in the past)  # Paroxysmal A-fib -EKG  from 02/10/2023 showed normal sinus rhythm, HR 58 bpm -Continue  metoprolol succinate 100 mg once daily -Continue Eliquis 5 mg twice daily  # Nicotine abuse Smoking cessation counseling provided.  Currently smokes 2 cigarettes/day. Smoking cessation instruction/counseling given:  counseled patient on the dangers of tobacco use, advised patient to stop smoking, and reviewed strategies to maximize success     Disposition:  Follow up  6 months  Signed Pascuala Klutts Verne Spurr, MD, 03/08/2023 1:43 PM    [facility] at [facility] [address] [place], [place], [place] [address]

## 2023-04-04 ENCOUNTER — Telehealth: Payer: Self-pay

## 2023-04-04 DIAGNOSIS — I5042 Chronic combined systolic (congestive) and diastolic (congestive) heart failure: Secondary | ICD-10-CM

## 2023-04-04 DIAGNOSIS — I429 Cardiomyopathy, unspecified: Secondary | ICD-10-CM

## 2023-04-04 DIAGNOSIS — I472 Ventricular tachycardia, unspecified: Secondary | ICD-10-CM

## 2023-04-04 NOTE — Telephone Encounter (Signed)
-----   Message from Rossie Muskrat [facility] sent at 04/03/2023  5:05 PM EDT ----- Beather Arbour, can we reorder the FDG PET for sarcoid and stress the importance of following the dietary prep restrictions to get an accurate result?  Thanks!  Sheria Lang T. Lalla Brothers, MD, [facility], [facility] Cardiac Electrophysiology

## 2023-04-04 NOTE — Telephone Encounter (Signed)
The patient's sister, Marcelino Duster, has been notified of the result and verbalized understanding.  All questions (if any) were answered. Frutoso Schatz, RN 04/04/2023 4:32 PM  PET CT for sarcoid has been reordered. Patient lives in a nursing home and they will need to be contacted for dietary prep [phone]). The sister Marcelino Duster would also like to be contacted once the test is scheduled so that she can make sure the right people at the nursing home are informed.

## 2023-05-15 ENCOUNTER — Telehealth (HOSPITAL_COMMUNITY): Payer: Self-pay | Admitting: *Deleted

## 2023-05-15 NOTE — Telephone Encounter (Signed)
Called and spoke with Revonda Standard from [facility] They have received the PET diet instructions and verbalized understanding. She did mention that patient does go to the vending machine on his own and people bring him food that is not apart of the diet. However, the facility will do their best to encourage patient to follow diet.  Larey Brick RN Navigator Cardiac Imaging [facility] Vascular Services [phone] Office [phone] Cell

## 2023-05-15 NOTE — Telephone Encounter (Signed)
Called patient to speak with sister regarding patient's diet prep. She expressed understanding of instructions.  Call back number given to sister.  Larey Brick RN Navigator Cardiac Imaging [facility] Heart and Vascular Services [phone] Office [phone] Cell

## 2023-05-17 ENCOUNTER — Encounter (HOSPITAL_COMMUNITY)
Admission: RE | Admit: 2023-05-17 | Discharge: 2023-05-17 | Disposition: A | Discharge status: Home / Self Care | Payer: Medicare (Managed Care) | Source: Ambulatory Visit | Attending: Cardiology | Admitting: Cardiology

## 2023-05-17 DIAGNOSIS — I472 Ventricular tachycardia, unspecified: Secondary | ICD-10-CM | POA: Diagnosis not present

## 2023-05-17 DIAGNOSIS — I429 Cardiomyopathy, unspecified: Secondary | ICD-10-CM | POA: Insufficient documentation

## 2023-05-17 DIAGNOSIS — I5042 Chronic combined systolic (congestive) and diastolic (congestive) heart failure: Secondary | ICD-10-CM | POA: Diagnosis present

## 2023-05-17 MED ORDER — RUBIDIUM RB82 GENERATOR (RUBYFILL)
17.1800 | PACK | Freq: Once | INTRAVENOUS | Status: AC
  Administered 2023-05-17: 17.18 via INTRAVENOUS

## 2023-05-17 MED ORDER — FLUDEOXYGLUCOSE F - 18 (FDG) INJECTION
8.9800 | Freq: Once | INTRAVENOUS | Status: AC
  Administered 2023-05-17: 8.98 via INTRAVENOUS

## 2023-05-18 LAB — NM PET CT MYOCARDIAL SARCOIDOSIS: Nuc Stress EF: 34 %

## 2023-06-12 ENCOUNTER — Telehealth: Payer: Self-pay

## 2023-06-12 DIAGNOSIS — I429 Cardiomyopathy, unspecified: Secondary | ICD-10-CM

## 2023-06-12 DIAGNOSIS — D8685 Sarcoid myocarditis: Secondary | ICD-10-CM

## 2023-06-12 DIAGNOSIS — I5042 Chronic combined systolic (congestive) and diastolic (congestive) heart failure: Secondary | ICD-10-CM

## 2023-06-12 NOTE — Telephone Encounter (Signed)
The patient's sister has been notified of the result and verbalized understanding.  All questions (if any) were answered. Frutoso Schatz, RN 06/12/2023 4:15 PM  Referral has been placed

## 2023-06-12 NOTE — Telephone Encounter (Signed)
-----   Message from Rossie Muskrat [facility] sent at 06/11/2023  2:50 PM EST ----- His Cardiac PET is consistent with sarcoidosis. I'd like to get him in with the HF clinic to discuss starting immunosuppression to treat active inflammation. Carly, can you get that appointment set up?  Sheria Lang T. Lalla Brothers, MD, [facility], [facility] Cardiac Electrophysiology

## 2023-06-29 ENCOUNTER — Telehealth (HOSPITAL_COMMUNITY): Payer: Self-pay

## 2023-06-29 NOTE — Telephone Encounter (Signed)
Called patient at [phone] and his sister answered the telephone. Spoke to patients sister and informed her of referral received from Dr. Geannie Risen office. Patients sister then told front office that this patient is in a nursing home and that the AHF clinic will need to contact them to set up an appointment due to the nursing home bringing him to his appointments.   Nursing home telephone number [phone]   Front office called nursing home and was unable to reach anyone. Front office left a detailed voice message for the nursing home staff to call the AHF clinic back to schedule appointment from referral.

## 2023-07-06 ENCOUNTER — Ambulatory Visit (HOSPITAL_COMMUNITY)
Admission: RE | Admit: 2023-07-06 | Discharge: 2023-07-06 | Disposition: A | Discharge status: Home / Self Care | Payer: Medicare (Managed Care) | Source: Ambulatory Visit | Attending: Cardiology | Admitting: Cardiology

## 2023-07-06 DIAGNOSIS — I2510 Atherosclerotic heart disease of native coronary artery without angina pectoris: Secondary | ICD-10-CM | POA: Diagnosis not present

## 2023-07-06 DIAGNOSIS — N184 Chronic kidney disease, stage 4 (severe): Secondary | ICD-10-CM | POA: Insufficient documentation

## 2023-07-06 DIAGNOSIS — Z89612 Acquired absence of left leg above knee: Secondary | ICD-10-CM | POA: Insufficient documentation

## 2023-07-06 DIAGNOSIS — Z720 Tobacco use: Secondary | ICD-10-CM | POA: Diagnosis not present

## 2023-07-06 DIAGNOSIS — E785 Hyperlipidemia, unspecified: Secondary | ICD-10-CM | POA: Diagnosis not present

## 2023-07-06 DIAGNOSIS — Z794 Long term (current) use of insulin: Secondary | ICD-10-CM | POA: Diagnosis not present

## 2023-07-06 DIAGNOSIS — E1122 Type 2 diabetes mellitus with diabetic chronic kidney disease: Secondary | ICD-10-CM | POA: Diagnosis not present

## 2023-07-06 DIAGNOSIS — F17210 Nicotine dependence, cigarettes, uncomplicated: Secondary | ICD-10-CM | POA: Diagnosis not present

## 2023-07-06 DIAGNOSIS — I480 Paroxysmal atrial fibrillation: Secondary | ICD-10-CM | POA: Diagnosis not present

## 2023-07-06 DIAGNOSIS — Z955 Presence of coronary angioplasty implant and graft: Secondary | ICD-10-CM | POA: Diagnosis not present

## 2023-07-06 DIAGNOSIS — D8685 Sarcoid myocarditis: Secondary | ICD-10-CM | POA: Diagnosis present

## 2023-07-06 DIAGNOSIS — I5042 Chronic combined systolic (congestive) and diastolic (congestive) heart failure: Secondary | ICD-10-CM | POA: Insufficient documentation

## 2023-07-06 DIAGNOSIS — I130 Hypertensive heart and chronic kidney disease with heart failure and stage 1 through stage 4 chronic kidney disease, or unspecified chronic kidney disease: Secondary | ICD-10-CM | POA: Insufficient documentation

## 2023-07-06 DIAGNOSIS — I472 Ventricular tachycardia, unspecified: Secondary | ICD-10-CM

## 2023-07-06 DIAGNOSIS — I429 Cardiomyopathy, unspecified: Secondary | ICD-10-CM | POA: Diagnosis present

## 2023-07-06 DIAGNOSIS — Z7902 Long term (current) use of antithrombotics/antiplatelets: Secondary | ICD-10-CM | POA: Insufficient documentation

## 2023-07-06 DIAGNOSIS — Z89611 Acquired absence of right leg above knee: Secondary | ICD-10-CM | POA: Diagnosis not present

## 2023-07-06 LAB — COMPREHENSIVE METABOLIC PANEL
ALT: 22 U/L (ref 0–44)
AST: 30 U/L (ref 15–41)
Albumin: 3 g/dL — ABNORMAL LOW (ref 3.5–5.0)
Alkaline Phosphatase: 142 U/L — ABNORMAL HIGH (ref 38–126)
Anion gap: 7 (ref 5–15)
BUN: 46 mg/dL — ABNORMAL HIGH (ref 8–23)
CO2: 21 mmol/L — ABNORMAL LOW (ref 22–32)
Calcium: 7.2 mg/dL — ABNORMAL LOW (ref 8.9–10.3)
Chloride: 113 mmol/L — ABNORMAL HIGH (ref 98–111)
Creatinine, Ser: 4.05 mg/dL — ABNORMAL HIGH (ref 0.61–1.24)
GFR, Estimated: 15 mL/min — ABNORMAL LOW (ref >60)
Glucose, Bld: 89 mg/dL (ref 70–99)
Potassium: 4.9 mmol/L (ref 3.5–5.1)
Sodium: 141 mmol/L (ref 135–145)
Total Bilirubin: 0.5 mg/dL (ref 0.0–1.2)
Total Protein: 7.2 g/dL (ref 6.5–8.1)

## 2023-07-06 LAB — BRAIN NATRIURETIC PEPTIDE: B Natriuretic Peptide: 266.2 pg/mL — ABNORMAL HIGH (ref 0.0–100.0)

## 2023-07-06 LAB — TSH: TSH: 1.68 u[IU]/mL (ref 0.350–4.500)

## 2023-07-06 NOTE — Patient Instructions (Signed)
Medication Changes:  No Changes In Medications at this time.   Lab Work:  Labs done today, your results will be available in MyChart, we will contact you for abnormal readings.  Testing/Procedures:  HIGH RESOLUTION CHEST CT- SOMEONE WILL CALL TO GET THIS SCHEDULED FOR YOU   Referrals:  YOU HAVE BEEN REFERRED TO PULMONOLOGY THEY WILL REACH OUT TO YOU OR CALL TO ARRANGE THIS. PLEASE CALL us WITH ANY CONCERNS   Follow-Up in: 3 MONTHS PLEASE CALL OUR OFFICE AROUND MARCH  TO GET SCHEDULED FOR YOUR APPOINTMENT. PHONE NUMBER IS [phone] OPTION 2   At the [facility], you and your health needs are our priority. We have a designated team specialized in the treatment of Heart Failure. This Care Team includes your primary Heart Failure Specialized Cardiologist (physician), Advanced Practice Providers (APPs- Physician Assistants and Nurse Practitioners), and Pharmacist who all work together to provide you with the care you need, when you need it.   You may see any of the following providers on your designated Care Team at your next follow up:  Dr. Arvilla Meres Dr. Marca Ancona Dr. Dorthula Nettles Dr. Theresia Bough Tonye Becket, NP Robbie Lis, [place] [facility] [place], [place] Brynda Peon, NP [place] Lee, NP Karle Plumber, PharmD   Please be sure to bring in all your medications bottles to every appointment.   Need to Contact us:  If you have any questions or concerns before your next appointment please send [place] a message through [place] or call our office at [phone].    TO LEAVE A MESSAGE FOR THE NURSE SELECT OPTION 2, PLEASE LEAVE A MESSAGE INCLUDING: YOUR NAME DATE OF BIRTH CALL BACK NUMBER REASON FOR CALL**this is important as we prioritize the call backs  YOU WILL RECEIVE A CALL BACK THE SAME DAY AS LONG AS YOU CALL BEFORE 4:00 PM

## 2023-07-06 NOTE — Progress Notes (Signed)
ADVANCED HEART FAILURE CLINIC NOTE  Referring Physician: Sherol Dade, *  Primary Care: Sherol Dade, DO Primary Cardiologist: Dr. Jenene Slicker  CC: Evaluation for cardiac sarcoid, HFrEF  HPI: Todd [name] is a 68 y.o. male with CAD status post LAD PCI in November 2023, history of VT in 04/2021 and 04/2022 (associated with cocaine use in 2022 and CAD requiring PCI in 2023), prolonged QT currently on mexiletine, paroxysmal atrial fibrillation, hypertension, insulin-dependent diabetes, hyperlipidemia, bilateral lower extremity AKA due to poorly controlled type 2 diabetes, CKD 4 presenting today to establish care.  Due to his history of recurrent VT and reduced LV function, patient underwent cardiac MRI which demonstrated LGE in a noncoronary distribution.  He had a follow-up PET scan which was consistent with cardiac sarcoid.  He prevents for further evaluation.  According to Todd [name] he has not had any ventricular tachycardia for at least 1 year now.  He was unaware of his diagnosis of cardiac sarcoid.  Currently lives at a assisted living facility.  He is wheelchair-bound due to bilateral AKA's.  Reports that his shortness of breath is fairly well-controlled and he does not feel limited by his underlying heart failure.   Past Medical History:  Diagnosis Date   AKI (acute kidney injury) (HCC)    Alcohol abuse    Cocaine abuse (HCC) 01/25/2021   Constipated    Diabetes mellitus without complication (HCC)    Diarrhea    Elevated LFTs    Goiter    Gout    Hyperlipidemia    Hypertension    Leukocytosis    Reactive thrombocytosis    Right BKA infection (HCC) 08/2016   Right leg pain    Sepsis due to undetermined organism [facility])    Thyroid disease    Wound infection after surgery 08/2016    Current Outpatient Medications  Medication Sig Dispense Refill   allopurinol (ZYLOPRIM) 100 MG tablet Take 1 tablet (100 mg total) by mouth daily. (Patient taking  differently: Take 100 mg by mouth in the morning.) 30 tablet 1   apixaban (ELIQUIS) 5 MG TABS tablet Take 1 tablet (5 mg total) by mouth 2 (two) times daily. 60 tablet 5   Ascorbic Acid 500 MG CAPS Take 500 mg by mouth daily.     atorvastatin (LIPITOR) 80 MG tablet Take 1 tablet (80 mg total) by mouth daily. 90 tablet 3   cyanocobalamin (V-R VITAMIN B-12) 500 MCG tablet Take 1 tablet (500 mcg total) by mouth in the morning. 30 tablet 6   ferrous sulfate 324 MG TBEC Take 324 mg by mouth.     hydrALAZINE (APRESOLINE) 25 MG tablet Take 1 tablet (25 mg total) by mouth 3 (three) times daily. 270 tablet 3   insulin lispro (HUMALOG KWIKPEN) 100 UNIT/ML KwikPen Inject into the skin.     isosorbide mononitrate (IMDUR) 60 MG 24 hr tablet Take 1 tablet (60 mg total) by mouth daily. 90 tablet 3   levothyroxine (SYNTHROID) 75 MCG tablet Take 1 tablet (75 mcg total) by mouth daily before breakfast. 90 tablet 3   lidocaine (LIDODERM) 5 % Place 1 patch onto the skin daily. Remove & Discard patch within 12 hours or as directed by MD 30 patch 0   metoprolol succinate (TOPROL-XL) 100 MG 24 hr tablet Take 1 tablet (100 mg total) by mouth daily. 90 tablet 3   mexiletine (MEXITIL) 150 MG capsule Take 1 capsule (150 mg total) by mouth every 8 (eight) hours.     mirtazapine (  REMERON) 15 MG tablet Take 15 mg by mouth at bedtime.     Multiple Vitamin (MULTIVITAMIN WITH MINERALS) TABS tablet Take 1 tablet by mouth daily. (Patient taking differently: Take 1 tablet by mouth in the morning.)     Nutritional Supplements (NUTRITIONAL DRINK) LIQD Take 240 mLs by mouth daily at 12 noon. House Supplement     pantoprazole (PROTONIX) 40 MG tablet Take 1 tablet (40 mg total) by mouth 2 (two) times daily.     polyethylene glycol (MIRALAX / GLYCOLAX) 17 g packet Take 17 g by mouth daily. (Patient taking differently: Take 17 g by mouth in the morning.) 14 each 0   sertraline (ZOLOFT) 50 MG tablet Take 1 tablet (50 mg total) by mouth at  bedtime. 30 tablet 1   sodium bicarbonate 650 MG tablet Take 650 mg by mouth 3 (three) times daily before meals.     thiamine 100 MG tablet Take 1 tablet (100 mg total) by mouth daily. (Patient taking differently: Take 100 mg by mouth in the morning.) 30 tablet 3   acetaminophen (TYLENOL) 325 MG tablet Take 2 tablets (650 mg total) by mouth every 6 (six) hours as needed for mild pain (or Fever >/= 101). (Patient taking differently: Take 650 mg by mouth every 6 (six) hours as needed for moderate pain.)     calcium carbonate (TUMS - DOSED IN MG ELEMENTAL CALCIUM) 500 MG chewable tablet Chew 1,000 mg by mouth 3 (three) times daily before meals.     Cholecalciferol (VITAMIN D3) 50 MCG (2000 UT) capsule Take 2,000 Units by mouth in the morning.     clopidogrel (PLAVIX) 75 MG tablet Take 1 tablet (75 mg total) by mouth daily with breakfast. 90 tablet 3   FIASP FLEXTOUCH 100 UNIT/ML FlexTouch Pen Inject 0-6 Units into the skin 3 (three) times daily before meals. Per SSI     folic acid (FOLVITE) 1 MG tablet Take 1 mg by mouth in the morning.     Glucagon, rDNA, (GLUCAGON EMERGENCY) 1 MG KIT Inject 1 mg into the muscle as needed (hypoglycemia).     LANTUS SOLOSTAR 100 UNIT/ML Solostar Pen Inject 12 Units into the skin at bedtime.     No current facility-administered medications for this encounter.    Allergies  Allergen Reactions   Lisinopril Other (See Comments)    Hyperkalemia / Renal failure      Social History   Socioeconomic History   Marital status: Single    Spouse name: Not on file   Number of children: Not on file   Years of education: Not on file   Highest education level: Not on file  Occupational History   Occupation: retired    Comment: drove a Glass blower/designer  Tobacco Use   Smoking status: Every Day    Current packs/day: 0.00    Average packs/day: 0.3 packs/day for 45.0 years (11.3 ttl pk-yrs)    Types: Cigarettes    Start date: 11/11/1969    Last attempt to quit: 11/12/2014     Years since quitting: 8.6    Passive exposure: Never   Smokeless tobacco: Never  Vaping Use   Vaping status: Never Used  Substance and Sexual Activity   Alcohol use: Not Currently    Alcohol/week: 2.0 standard drinks of alcohol    Types: 1 Glasses of wine, 1 Cans of beer per week   Drug use: Not Currently    Types: Cocaine   Sexual activity: Not Currently  Other Topics Concern  Not on file  Social History Narrative   09/23/20 - Lives alone, uses a wheelchair, double amputee, not married, no children - HHA comes in 3x per week to help him bathe and set up weekly meds.   Social Drivers of Corporate investment banker Strain: Low Risk  (08/05/2022)   Received from [facility], [facility]   Overall Financial Resource Strain (CARDIA)    Difficulty of Paying Living Expenses: Not hard at all  Food Insecurity: No Food Insecurity (08/05/2022)   Received from [facility], [facility]   Hunger Vital Sign    Worried About Running Out of Food in the Last Year: Never true    Ran Out of Food in the Last Year: Never true  Transportation Needs: No Transportation Needs (08/05/2022)   Received from [facility], [facility]   PRAPARE - Transportation    Lack of Transportation (Medical): No    Lack of Transportation (Non-Medical): No  Physical Activity: Inactive (09/23/2020)   Exercise Vital Sign    Days of Exercise per Week: 0 days    Minutes of Exercise per Session: 0 min  Stress: No Stress Concern Present (09/23/2020)   [employer] of Occupational Health - Occupational Stress Questionnaire    Feeling of Stress : Only a little  Social Connections: Unknown (07/25/2022)   Received from [facility], [facility]   Social Network    Social Network: Not on file  Intimate Partner Violence: Unknown (07/25/2022)   Received from [facility], [facility]   HITS    Physically Hurt: Not on file    Insult or Talk Down To: Not on file    Threaten Physical Harm: Not on file     Scream or Curse: Not on file      Family History  Problem Relation Age of Onset   Heart disease Mother    Pneumonia Father    Diabetes Maternal Aunt    Diabetes Maternal Uncle     PHYSICAL EXAM: There were no vitals filed for this visit. GENERAL: chronically ill appearing, disheveled M in wheelchari HEENT: There is no scleral icterus.  The mucous membranes are pink and moist.   CHEST: There are no chest wall deformities. There is no chest wall tenderness. Respirations are unlabored.  Lungs- decreased b/l CARDIAC:  JVP: 5 cm          Normal rate with regular rhythm. No murmur.  Pulses are 2+ and symmetrical in upper and lower extremities. no edema.  ABDOMEN: Soft, non-tender, non-distended. There are normal bowel sounds.  EXTREMITIES: B/L AKA  NEUROLOGIC: Patient is oriented x3 with no obvious focal neurologic deficits.  PSYCH: Patients affect is appropriate SKIN: Warm and dry; no lesions or wounds.     DATA REVIEW  ECG: 02/10/23: NSR  As per my personal interpretation  ECHO: 05/04/22: LVEF 45-50%, normal RV function  CATH: 05/09/22: Significant single-vessel coronary artery disease with 60-70% mid LAD stenosis that is hemodynamically significant (RFR = 0.87).  There is mild, non-obstructive disease in the mid LCx and RCA. Normal left ventricular filling pressure (LVEDP 10 mmHg). Successful RFR-guided PCI to mid LAD using synergy 2.75 x 16 mm drug-eluting stent (postdilated to 3.1 mm) with 0% residual stenosis and TIMI-3 flow.   CMR:  1. Mild LVE with global hypokinesis worse in the basal septum. Moderate LVH septal thickness 15 mm LVEF 31%  2. Gadolinium enhancement similar to study done 07/22/21 see description above and consistent with sarcoid  3.  Mildly elevated T1 and ECV  4.  Normal RV size and function RVEF 55%  5.  Decreased cardiac output 2.4 L/min  6.  Mild bi atrial enlargement  CARDIAC PET: 05/18/23:    FDG uptake findings are consistent with active  myocardial inflammation/sarcoidosis.   FDG uptake was observed. FDG uptake was focal. FDG uptake was present in the basal to mid septal and lateral walls. LV perfusion is normal.   Left ventricular function is abnormal. Global function is moderately reduced. EF: 34%.   Coronary calcium assessment not performed due to prior revascularization.   Electronically signed by Epifanio Lesches, MD  ASSESSMENT & PLAN:  Heart failure with reduced EF Etiology of EA:VWUJW ischemic and nonischemic 2/2 CAD & possible underlying sarcoid NYHA class / AHA Stage: NYHA II-III, limited by deconditioning and b/l AKA Volume status & Diuretics: euvolemic to hypovolemic Vasodilators:hydralazine 25mg  TID, imdur 60mg  daily Beta-Blocker:metoprolol 100mg  daily JXB:[id number] 2/2 advanced CKD Cardiometabolic:hx of uncontrolled T2DM; can start at follow up.  Devices therapies & Valvulopathies:followed by EP Advanced therapies:Not a candidate.   2. Evaluation for cardiac sarcoid - LGE pattern demonstrated on CMR & postive PET in the setting of VT is very likely consistent with cardiac sarcoid. I had an extensive discussion with Mr. Shaff about this today.  - I remain very concerned about starting him on immunosuppression for sarcoid with his current medical comorbidities and poor healthcare literacy. He has bilateral AKAs due to uncontrolled T2DM, CKD IV, hx of polysubstance abuse, VT that occurred after cocaine use.  I feel that the risk of infection and side effects related to immunosupression out weigh the benefits. We could revisit this if he has further clinical decline secondary to presumed cardiac sarcoid.  - I will reach out to his primary cardiologist, EP and PCP to discuss this further.  - Quantiferon gold today to rule out indolent TB.  - HRCT to evaluate for hilar adenopathy; would prefer tissue sample confirming sarcoid if we were to pursue high risk immunosuppression.   3. CAD - s/p PCI to the  LAD in 11/23 - continue plavix 75mg  daily  - lipitor 80mg  daily - no chest pain currently  - followed by Dr. Jenene Slicker  4. Hx of VT - Initially associated with cocaine use in 11/22. He had a second episode of VT in 11/23; LHC at that time demonstrated severe stenosis of the mid LAD now s/p PCI - Would hold off treatment for sarcoid unless he has VT that is not secondary to drug use / underlying CAD and deemed to be primary from sarcoid.  - Currently on mexilitine & toprol  5. pAFIB - NSR today - continue apixaban  - continue toprol  6. CKD IV - sCr severely elevated to 4 from 2.  - appears euvolemic to hypovolemic on exam; hold diuretics.  - Reaching out to nephrology for close follow up and repeat labs.   7. Tobacco use - counseled on smoking cessation. Continues to smoke 2-3 cigs daily.   8. T2dm - Better controlled now; A1C 5.5   Adrianah Prophete Advanced Heart Failure Mechanical Circulatory Support

## 2023-07-10 LAB — QUANTIFERON-TB GOLD PLUS (RQFGPL)
QuantiFERON Mitogen Value: 1.16 [IU]/mL
QuantiFERON Nil Value: 0.06 [IU]/mL
QuantiFERON TB1 Ag Value: 0.05 [IU]/mL
QuantiFERON TB2 Ag Value: 0.07 [IU]/mL

## 2023-07-10 LAB — QUANTIFERON-TB GOLD PLUS: QuantiFERON-TB Gold Plus: NEGATIVE

## 2023-07-12 ENCOUNTER — Telehealth (HOSPITAL_COMMUNITY): Payer: Self-pay | Admitting: *Deleted

## 2023-07-12 NOTE — Telephone Encounter (Signed)
Called [facility] and [place], pt sister, and [facility] per Dr. Gasper Lloyd with following message:  "Can we please refer him to nephrology for his progressively worsening renal failure."  Unable to reach patient. Spoke with patient sister and [place]. Called [facility] - clinic note faxed to: [phone] with referral request. Provided office with patient, family, and [facility] contact information.

## 2023-08-06 NOTE — Progress Notes (Unsigned)
 Teron Blais, male    DOB: 11-03-1955    MRN: [id number]   Brief patient profile:  76  yobm  active smoker s/p Bka bilateral  referred to pulmonary clinic in [place]  08/08/2023 by Robbie Lis  for ? Sarcoidosis    [place] admit 2018 for gangrene/ AKI from SNF   History of Present Illness  08/08/2023  Pulmonary/ 1st office eval/ [facility] Office  Chief Complaint  Patient presents with   Consult  Dyspnea:  not limited by doe/ w/c bound sp bilateral bka Cough:  none  Sleep: able to lie flat bed one pillow SABA use: none  02: none     No obvious day to day or daytime pattern/variability or assoc excess/ purulent sputum or mucus plugs or hemoptysis or cp or chest tightness, subjective wheeze or overt sinus or hb symptoms.    Also denies any obvious fluctuation of symptoms with weather or environmental changes or other aggravating or alleviating factors except as outlined above   No unusual exposure hx or h/o childhood pna/ asthma or knowledge of premature birth.  Current Allergies, Complete Past Medical History, Past Surgical History, Family History, and Social History were reviewed in [employer] record.  ROS  The following are not active complaints unless bolded Hoarseness, sore throat, dysphagia, dental problems, itching, sneezing,  nasal congestion or discharge of excess mucus or purulent secretions, ear ache,   fever, chills, sweats, unintended wt loss or wt gain, classically pleuritic or exertional cp,  orthopnea pnd or arm/hand swelling  or leg swelling, presyncope, palpitations, abdominal pain, anorexia, nausea, vomiting, diarrhea  or change in bowel habits or change in bladder habits, change in stools or change in urine, dysuria, hematuria,  rash, arthralgias, visual complaints, headache, numbness, weakness or ataxia or problems with walking or coordination,  change in mood or  memory.            Outpatient Medications Prior to Visit   Medication Sig Dispense Refill   acetaminophen (TYLENOL) 325 MG tablet Take 2 tablets (650 mg total) by mouth every 6 (six) hours as needed for mild pain (or Fever >/= 101). (Patient taking differently: Take 650 mg by mouth every 6 (six) hours as needed for moderate pain (pain score 4-6).)     allopurinol (ZYLOPRIM) 100 MG tablet Take 1 tablet (100 mg total) by mouth daily. (Patient taking differently: Take 100 mg by mouth in the morning.) 30 tablet 1   apixaban (ELIQUIS) 5 MG TABS tablet Take 1 tablet (5 mg total) by mouth 2 (two) times daily. 60 tablet 5   Ascorbic Acid 500 MG CAPS Take 500 mg by mouth daily.     atorvastatin (LIPITOR) 80 MG tablet Take 1 tablet (80 mg total) by mouth daily. 90 tablet 3   calcium carbonate (TUMS - DOSED IN MG ELEMENTAL CALCIUM) 500 MG chewable tablet Chew 1,000 mg by mouth 3 (three) times daily before meals.     Cholecalciferol (VITAMIN D3) 50 MCG (2000 UT) capsule Take 2,000 Units by mouth in the morning.     clopidogrel (PLAVIX) 75 MG tablet Take 1 tablet (75 mg total) by mouth daily with breakfast. 90 tablet 3   cyanocobalamin (V-R VITAMIN B-12) 500 MCG tablet Take 1 tablet (500 mcg total) by mouth in the morning. 30 tablet 6   ferrous sulfate 324 MG TBEC Take 324 mg by mouth.     FIASP FLEXTOUCH 100 UNIT/ML FlexTouch Pen Inject 0-6 Units into the skin  3 (three) times daily before meals. Per SSI     folic acid (FOLVITE) 1 MG tablet Take 1 mg by mouth in the morning.     Glucagon, rDNA, (GLUCAGON EMERGENCY) 1 MG KIT Inject 1 mg into the muscle as needed (hypoglycemia).     hydrALAZINE (APRESOLINE) 25 MG tablet Take 1 tablet (25 mg total) by mouth 3 (three) times daily. 270 tablet 3   insulin lispro (HUMALOG KWIKPEN) 100 UNIT/ML KwikPen Inject into the skin.     isosorbide mononitrate (IMDUR) 60 MG 24 hr tablet Take 1 tablet (60 mg total) by mouth daily. 90 tablet 3   LANTUS SOLOSTAR 100 UNIT/ML Solostar Pen Inject 12 Units into the skin at bedtime.      levothyroxine (SYNTHROID) 75 MCG tablet Take 1 tablet (75 mcg total) by mouth daily before breakfast. 90 tablet 3   lidocaine (LIDODERM) 5 % Place 1 patch onto the skin daily. Remove & Discard patch within 12 hours or as directed by MD 30 patch 0   metoprolol succinate (TOPROL-XL) 100 MG 24 hr tablet Take 1 tablet (100 mg total) by mouth daily. 90 tablet 3   mexiletine (MEXITIL) 150 MG capsule Take 1 capsule (150 mg total) by mouth every 8 (eight) hours.     mirtazapine (REMERON) 15 MG tablet Take 15 mg by mouth at bedtime.     Multiple Vitamin (MULTIVITAMIN WITH MINERALS) TABS tablet Take 1 tablet by mouth daily. (Patient taking differently: Take 1 tablet by mouth in the morning.)     Nutritional Supplements (NUTRITIONAL DRINK) LIQD Take 240 mLs by mouth daily at 12 noon. House Supplement     pantoprazole (PROTONIX) 40 MG tablet Take 1 tablet (40 mg total) by mouth 2 (two) times daily.     polyethylene glycol (MIRALAX / GLYCOLAX) 17 g packet Take 17 g by mouth daily. (Patient taking differently: Take 17 g by mouth in the morning.) 14 each 0   sertraline (ZOLOFT) 50 MG tablet Take 1 tablet (50 mg total) by mouth at bedtime. 30 tablet 1   sodium bicarbonate 650 MG tablet Take 650 mg by mouth 3 (three) times daily before meals.     thiamine 100 MG tablet Take 1 tablet (100 mg total) by mouth daily. (Patient taking differently: Take 100 mg by mouth in the morning.) 30 tablet 3   No facility-administered medications prior to visit.    Past Medical History:  Diagnosis Date   AKI (acute kidney injury) (HCC)    Alcohol abuse    Cocaine abuse (HCC) 01/25/2021   Constipated    Diabetes mellitus without complication (HCC)    Diarrhea    Elevated LFTs    Goiter    Gout    Hyperlipidemia    Hypertension    Leukocytosis    Reactive thrombocytosis    Right BKA infection (HCC) 08/2016   Right leg pain    Sepsis due to undetermined organism [facility])    Thyroid disease    Wound infection after surgery  08/2016      Objective:     BP (!) 161/83   Pulse (!) 54   SpO2 99% Comment: room air  SpO2: 99 % (room air)  W/c bound bilateral bka alert and pleasant    HEENT : Oropharynx  clear     Nasal turbinates nl    NECK :  without  apparent JVD/ palpable Nodes/TM    LUNGS: no acc muscle use,  Nl contour chest which is clear  to A and P bilaterally without cough on insp or exp maneuvers   CV:  RRR  no s3 or murmur or increase in P2, and no edema   ABD:  soft and nontender   MS:    ext warm with B bka  s  other obvious deformities/ joint restrictions   cyanosis or clubbing    SKIN: warm and dry without lesions    NEURO:  alert, approp, nl sensorium with  no motor or cerebellar deficits apparent.   Labs ordered/ reviewed:      Chemistry      Component Value Date/Time   NA 144 08/08/2023 1613   K 4.4 08/08/2023 1613   CL 106 08/08/2023 1613   CO2 21 08/08/2023 1613   BUN 24 08/08/2023 1613   CREATININE 2.64 (H) 08/08/2023 1613   GLU 57 11/29/2022 0000      Component Value Date/Time   CALCIUM 8.1 (L) 08/08/2023 1613   ALKPHOS 142 (H) 07/06/2023 1544   AST 30 07/06/2023 1544   ALT 22 07/06/2023 1544   BILITOT 0.5 07/06/2023 1544   BILITOT 0.6 08/30/2021 0923     Alb 3.0/ TP  7.2   07/06/23      Lab Results  Component Value Date   WBC 8.6 08/08/2023   HGB 12.5 (L) 08/08/2023   HCT 38.4 08/08/2023   MCV 86 08/08/2023   PLT 459 (H) 08/08/2023     No results found for: "DDIMER"    Lab Results  Component Value Date   TSH 1.680 07/06/2023     ACE   08/08/2023   = 89     Lab Results  Component Value Date   ESRSEDRATE 50 (H) 08/08/2023   ESRSEDRATE 72 (H) 05/15/2022   ESRSEDRATE 61 (H) 03/29/2022        I personally reviewed images and agree with radiology impression as follows:   Chest CTcuts on coronary study   05/18/23   Limited view of the lung parenchyma demonstrates no suspicious nodularity. Airways are normal. Limited view of the mediastinum  demonstrates no adenopathy.  And neg LDSCT  01/16/23     Assessment   Cardiac sarcoidosis A good rule of thumb is that >95% of pts with active sarcoid in any organ will have some plain cxr changes - on the other hand  if there are active pulmonary symptoms the cxr will look much worse than the patient:  No evidence of either scenario here/ strongly doubt active pulmonary dz at this point but the ESR and ACE levels are supportive of limited cardiac sarcoid.  Dr Mechele Collin with our group has a special interest in sarcoidosis and would be a good resource going forward so I made the referral today and we can see him back here prn  Acute kidney injury superimposed on chronic kidney disease (HCC) Lab Results  Component Value Date   CREATININE 2.64 (H) 08/08/2023   CREATININE 4.05 (H) 07/06/2023   CREATININE 1.9 (A) 11/29/2022    Calcium is normal so this is less likely sarcoid related but needs close f/u by renal if not already established and in the meantime avoid nephrotoxins         Each maintenance medication was reviewed in detail including emphasizing most importantly the difference between maintenance and prns and under what circumstances the prns are to be triggered using an action plan format where appropriate.  Total time for H and P, chart review, counseling, and generating customized AVS unique to  this office visit / same day charting = 30 min new pt eval not all captured during screen time.                Sandrea Hughs, MD 08/10/2023

## 2023-08-08 ENCOUNTER — Ambulatory Visit: Payer: Medicare (Managed Care) | Admitting: Internal Medicine

## 2023-08-08 ENCOUNTER — Encounter: Payer: Self-pay | Admitting: Internal Medicine

## 2023-08-08 VITALS — BP 161/83 | HR 54

## 2023-08-08 DIAGNOSIS — F17210 Nicotine dependence, cigarettes, uncomplicated: Secondary | ICD-10-CM | POA: Diagnosis not present

## 2023-08-08 DIAGNOSIS — D8685 Sarcoid myocarditis: Secondary | ICD-10-CM | POA: Diagnosis not present

## 2023-08-08 DIAGNOSIS — N179 Acute kidney failure, unspecified: Secondary | ICD-10-CM | POA: Diagnosis not present

## 2023-08-08 DIAGNOSIS — N189 Chronic kidney disease, unspecified: Secondary | ICD-10-CM

## 2023-08-08 NOTE — Patient Instructions (Signed)
 My office will be contacting you by phone for referral to Dr Everardo All, our sarcoid specialist in [place] at the [facility] center  - if you don't hear back from my office within one week please call us back or notify us thru MyChart and we'll address it right away.    The key is to stop smoking completely before smoking completely stops you!    Please remember to go to the lab department   for your tests - we will call you with the results when they are available.   Pulmonary clinic follow up here as needed

## 2023-08-09 LAB — CBC WITH DIFFERENTIAL/PLATELET
Basophils Absolute: 0.1 10*3/uL (ref 0.0–0.2)
Basos: 1 %
EOS (ABSOLUTE): 0.5 10*3/uL — ABNORMAL HIGH (ref 0.0–0.4)
Eos: 6 %
Hematocrit: 38.4 % (ref 37.5–51.0)
Hemoglobin: 12.5 g/dL — ABNORMAL LOW (ref 13.0–17.7)
Immature Grans (Abs): 0 10*3/uL (ref 0.0–0.1)
Immature Granulocytes: 0 %
Lymphocytes Absolute: 2.5 10*3/uL (ref 0.7–3.1)
Lymphs: 29 %
MCH: 27.9 pg (ref 26.6–33.0)
MCHC: 32.6 g/dL (ref 31.5–35.7)
MCV: 86 fL (ref 79–97)
Monocytes Absolute: 0.3 10*3/uL (ref 0.1–0.9)
Monocytes: 4 %
Neutrophils Absolute: 5.1 10*3/uL (ref 1.4–7.0)
Neutrophils: 60 %
Platelets: 459 10*3/uL — ABNORMAL HIGH (ref 150–450)
RBC: 4.48 x10E6/uL (ref 4.14–5.80)
RDW: 15.2 % (ref 11.6–15.4)
WBC: 8.6 10*3/uL (ref 3.4–10.8)

## 2023-08-09 LAB — BASIC METABOLIC PANEL
BUN/Creatinine Ratio: 9 — ABNORMAL LOW (ref 10–24)
BUN: 24 mg/dL (ref 8–27)
CO2: 21 mmol/L (ref 20–29)
Calcium: 8.1 mg/dL — ABNORMAL LOW (ref 8.6–10.2)
Chloride: 106 mmol/L (ref 96–106)
Creatinine, Ser: 2.64 mg/dL — ABNORMAL HIGH (ref 0.76–1.27)
Glucose: 66 mg/dL — ABNORMAL LOW (ref 70–99)
Potassium: 4.4 mmol/L (ref 3.5–5.2)
Sodium: 144 mmol/L (ref 134–144)
eGFR: 26 mL/min/{1.73_m2} — ABNORMAL LOW (ref >59)

## 2023-08-09 LAB — SEDIMENTATION RATE: Sed Rate: 50 mm/h — ABNORMAL HIGH (ref 0–30)

## 2023-08-09 LAB — ANGIOTENSIN CONVERTING ENZYME: Angio Convert Enzyme: 89 U/L — ABNORMAL HIGH (ref 14–82)

## 2023-08-10 ENCOUNTER — Encounter: Payer: Self-pay | Admitting: Internal Medicine

## 2023-08-10 NOTE — Assessment & Plan Note (Addendum)
 Lab Results  Component Value Date   CREATININE 2.64 (H) 08/08/2023   CREATININE 4.05 (H) 07/06/2023   CREATININE 1.9 (A) 11/29/2022    Calcium is normal so this is less likely sarcoid related but needs close f/u by renal if not already established and in the meantime avoid nephrotoxins         Each maintenance medication was reviewed in detail including emphasizing most importantly the difference between maintenance and prns and under what circumstances the prns are to be triggered using an action plan format where appropriate.  Total time for H and P, chart review, counseling, and generating customized AVS unique to this office visit / same day charting = 30 min new pt eval not all captured during screen time.

## 2023-08-10 NOTE — Assessment & Plan Note (Signed)
 A good rule of thumb is that >95% of pts with active sarcoid in any organ will have some plain cxr changes - on the other hand  if there are active pulmonary symptoms the cxr will look much worse than the patient:  No evidence of either scenario here/ strongly doubt active pulmonary dz at this point but the ESR and ACE levels are supportive of limited cardiac sarcoid.  Dr Mechele Collin with our group has a special interest in sarcoidosis and would be a good resource going forward so I made the referral today and we can see him back here prn

## 2023-08-25 ENCOUNTER — Ambulatory Visit: Payer: Medicare (Managed Care) | Attending: Internal Medicine | Admitting: Internal Medicine

## 2023-08-25 ENCOUNTER — Encounter: Payer: Self-pay | Admitting: Internal Medicine

## 2023-08-25 VITALS — BP 118/68 | HR 62

## 2023-08-25 DIAGNOSIS — I480 Paroxysmal atrial fibrillation: Secondary | ICD-10-CM

## 2023-08-25 NOTE — Progress Notes (Signed)
 Cardiology Office Note  Date: 08/25/2023   ID: Todd [name], DOB [birth year], MRN [id number]  PCP:  Sherol Dade, DO  Cardiologist:  Marjo Bicker, MD Electrophysiologist:  Lanier Prude, MD    History of Present Illness: Todd [name] is a 68 y.o. male known to have CAD manifested by V. tach in 04/2022 s/p LAD PCI with LVEF 45 to 50%, sustained V. tach secondary to cocaine use in 04/2021 (followed by EP, not a candidate for ICD due to polysubstance abuse), prolonged QT interval secondary to amiodarone use (currently on mexiletine), concern for sarcoidosis on documentation (per cardiac MRI), paroxysmal A-fib on Eliquis, HTN, IDDM, HLD, bilateral lower extremity amputation secondary to poorly controlled DM2, CKD presented to the cardiology clinic for follow-up visit.  Patient had sustained V. tach in 04/2021 with LVEF 45 to 50% but LHC was deferred due to CKD. V. tach was deemed to be from cocaine use. He was again readmitted 04/2022 with a different reason but was found to be in V. tach, underwent LHC and showed severe stenosis of mid LAD, underwent PCI. He was seen by EP for ICD candidacy but deferred due to polysubstance abuse.  Cardiac MRI showed LGE pattern and cardiac PET scan findings were consistent with cardiac sarcoid.  Due to current medical comorbidities and poor healthcare literacy, AHF team felt that the risk of infection side effects related to immunosuppression outweighed the benefits. Patient is wheelchair-bound, has bilateral lower EXTR amputations, currently lives in a facility.  He denies having any symptoms, doing great overall.  No angina, DOE, orthopnea, PND, presyncope, syncope.  No weakness in bilateral upper extremities.  Last use of cocaine was couple of years ago.  Smoking cigarettes.   Past Medical History:  Diagnosis Date   AKI (acute kidney injury) (HCC)    Alcohol abuse    Cocaine abuse (HCC) 01/25/2021   Constipated    Diabetes  mellitus without complication (HCC)    Diarrhea    Elevated LFTs    Goiter    Gout    Hyperlipidemia    Hypertension    Leukocytosis    Reactive thrombocytosis    Right BKA infection (HCC) 08/2016   Right leg pain    Sepsis due to undetermined organism [facility])    Thyroid disease    Wound infection after surgery 08/2016    Past Surgical History:  Procedure Laterality Date   ABDOMINAL AORTOGRAM N/A 08/11/2016   Procedure: Abdominal Aortogram;  Surgeon: Maeola Harman, MD;  Location: [facility] INVASIVE CV LAB;  Service: Cardiovascular;  Laterality: N/A;   ABDOMINAL AORTOGRAM W/LOWER EXTREMITY N/A 08/15/2016   Procedure: Abdominal Aortogram w/Lower Extremity;  Surgeon: Sherren Kerns, MD;  Location: [facility] INVASIVE CV LAB;  Service: Cardiovascular;  Laterality: N/A;   AMPUTATION Right 08/17/2016   Procedure: RIGHT BELOW KNEE AMPUTATION;  Surgeon: Sherren Kerns, MD;  Location: [facility] OR;  Service: Vascular;  Laterality: Right;   AMPUTATION Right 09/12/2016   Procedure: AMPUTATION ABOVE KNEE;  Surgeon: Nadara Mustard, MD;  Location: [facility] OR;  Service: Orthopedics;  Laterality: Right;   AMPUTATION Left 08/12/2016   Procedure: LEFT BELOW KNEE AMPUTATION;  Surgeon: Nadara Mustard, MD;  Location: [facility] OR;  Service: Orthopedics;  Laterality: Left;   AMPUTATION Left 11/01/2017   Procedure: LEFT ABOVE KNEE AMPUTATION;  Surgeon: Nadara Mustard, MD;  Location: [facility] OR;  Service: Orthopedics;  Laterality: Left;   APPLICATION OF WOUND VAC Right 09/12/2016   Procedure: APPLICATION OF WOUND  VAC ABOVE KNEE;  Surgeon: Nadara Mustard, MD;  Location: [facility] OR;  Service: Orthopedics;  Laterality: Right;   BIOPSY  01/13/2021   Procedure: BIOPSY;  Surgeon: Malissa Hippo, MD;  Location: [facility] ENDO SUITE;  Service: Endoscopy;;  esophageal   COLONOSCOPY WITH PROPOFOL N/A 01/13/2021   Procedure: COLONOSCOPY WITH PROPOFOL;  Surgeon: Malissa Hippo, MD;  Location: [facility] END[facility] SUITE;  Service: Endoscopy;  Laterality: N/A;   CORONARY  PRESSURE/FFR STUDY N/A 05/09/2022   Procedure: INTRAVASCULAR PRESSURE WIRE/FFR STUDY;  Surgeon: Yvonne Kendall, MD;  Location: [facility] INVASIVE CV LAB;  Service: Cardiovascular;  Laterality: N/A;   CORONARY STENT INTERVENTION N/A 05/09/2022   Procedure: CORONARY STENT INTERVENTION;  Surgeon: Yvonne Kendall, MD;  Location: [facility] INVASIVE CV LAB;  Service: Cardiovascular;  Laterality: N/A;   ESOPHAGOGASTRODUODENOSCOPY (EGD) WITH PROPOFOL N/A 01/13/2021   Procedure: ESOPHAGOGASTRODUODENOSCOPY (EGD) WITH PROPOFOL;  Surgeon: Malissa Hippo, MD;  Location: [facility];  Service: Endoscopy;  Laterality: N/A;   LEFT HEART CATH AND CORONARY ANGIOGRAPHY N/A 05/09/2022   Procedure: LEFT HEART CATH AND CORONARY ANGIOGRAPHY;  Surgeon: Yvonne Kendall, MD;  Location: [facility] INVASIVE CV LAB;  Service: Cardiovascular;  Laterality: N/A;   LOWER EXTREMITY ANGIOGRAPHY Bilateral 08/11/2016   Procedure: Lower Extremity Angiography;  Surgeon: Maeola Harman, MD;  Location: [facility] INVASIVE CV LAB;  Service: Cardiovascular;  Laterality: Bilateral;   PERIPHERAL VASCULAR BALLOON ANGIOPLASTY Left 08/11/2016   Procedure: Peripheral Vascular Balloon Angioplasty;  Surgeon: Maeola Harman, MD;  Location: [facility] INVASIVE CV LAB;  Service: Cardiovascular;  Laterality: Left;  SFA   POLYPECTOMY  01/13/2021   Procedure: POLYPECTOMY;  Surgeon: Malissa Hippo, MD;  Location: [facility];  Service: Endoscopy;;   THYROID SURGERY      Current Outpatient Medications  Medication Sig Dispense Refill   acetaminophen (TYLENOL) 325 MG tablet Take 2 tablets (650 mg total) by mouth every 6 (six) hours as needed for mild pain (or Fever >/= 101). (Patient taking differently: Take 650 mg by mouth every 6 (six) hours as needed for moderate pain (pain score 4-6).)     allopurinol (ZYLOPRIM) 100 MG tablet Take 1 tablet (100 mg total) by mouth daily. (Patient taking differently: Take 100 mg by mouth in the morning.) 30 tablet 1   apixaban  (ELIQUIS) 5 MG TABS tablet Take 1 tablet (5 mg total) by mouth 2 (two) times daily. 60 tablet 5   Ascorbic Acid 500 MG CAPS Take 500 mg by mouth daily.     atorvastatin (LIPITOR) 80 MG tablet Take 1 tablet (80 mg total) by mouth daily. 90 tablet 3   calcium carbonate (TUMS - DOSED IN MG ELEMENTAL CALCIUM) 500 MG chewable tablet Chew 1,000 mg by mouth 3 (three) times daily before meals.     Cholecalciferol (VITAMIN D3) 50 MCG (2000 UT) capsule Take 2,000 Units by mouth in the morning.     cyanocobalamin (V-R VITAMIN B-12) 500 MCG tablet Take 1 tablet (500 mcg total) by mouth in the morning. 30 tablet 6   ferrous sulfate 324 MG TBEC Take 324 mg by mouth.     FIASP FLEXTOUCH 100 UNIT/ML FlexTouch Pen Inject 0-6 Units into the skin 3 (three) times daily before meals. Per SSI     folic acid (FOLVITE) 1 MG tablet Take 1 mg by mouth in the morning.     Glucagon, rDNA, (GLUCAGON EMERGENCY) 1 MG KIT Inject 1 mg into the muscle as needed (hypoglycemia).     hydrALAZINE (APRESOLINE) 25  MG tablet Take 1 tablet (25 mg total) by mouth 3 (three) times daily. 270 tablet 3   isosorbide mononitrate (IMDUR) 60 MG 24 hr tablet Take 1 tablet (60 mg total) by mouth daily. 90 tablet 3   levothyroxine (SYNTHROID) 75 MCG tablet Take 1 tablet (75 mcg total) by mouth daily before breakfast. (Patient taking differently: Take 100 mcg by mouth daily before breakfast.) 90 tablet 3   lidocaine (LIDODERM) 5 % Place 1 patch onto the skin daily. Remove & Discard patch within 12 hours or as directed by MD 30 patch 0   magnesium oxide (MAG-OX) 400 (240 Mg) MG tablet Take 400 mg by mouth daily.     metoprolol succinate (TOPROL-XL) 100 MG 24 hr tablet Take 1 tablet (100 mg total) by mouth daily. 90 tablet 3   mexiletine (MEXITIL) 150 MG capsule Take 1 capsule (150 mg total) by mouth every 8 (eight) hours.     mirtazapine (REMERON) 15 MG tablet Take 15 mg by mouth at bedtime.     Multiple Vitamin (MULTIVITAMIN WITH MINERALS) TABS tablet  Take 1 tablet by mouth daily. (Patient taking differently: Take 1 tablet by mouth in the morning.)     Nutritional Supplements (NUTRITIONAL DRINK) LIQD Take 240 mLs by mouth daily at 12 noon. House Supplement     pantoprazole (PROTONIX) 40 MG tablet Take 1 tablet (40 mg total) by mouth 2 (two) times daily.     polyethylene glycol (MIRALAX / GLYCOLAX) 17 g packet Take 17 g by mouth daily. (Patient taking differently: Take 17 g by mouth in the morning.) 14 each 0   sertraline (ZOLOFT) 50 MG tablet Take 1 tablet (50 mg total) by mouth at bedtime. 30 tablet 1   sodium bicarbonate 650 MG tablet Take 650 mg by mouth 3 (three) times daily before meals.     thiamine 100 MG tablet Take 1 tablet (100 mg total) by mouth daily. (Patient taking differently: Take 100 mg by mouth in the morning.) 30 tablet 3   No current facility-administered medications for this visit.   Allergies:  Lisinopril   Social History: The patient  reports that he has been smoking cigarettes. He started smoking about 53 years ago. He has a 11.3 pack-year smoking history. He has never been exposed to tobacco smoke. He has never used smokeless tobacco. He reports that he does not currently use alcohol after a past usage of about 2.0 standard drinks of alcohol per week. He reports that he does not currently use drugs after having used the following drugs: Cocaine.   Family History: The patient's family history includes Diabetes in his maternal aunt and maternal uncle; Heart disease in his mother; Pneumonia in his father.   ROS:  Please see the history of present illness. Otherwise, complete review of systems is positive for none.  All other systems are reviewed and negative.   Physical Exam: VS:  BP 118/68   Pulse 62   SpO2 96% , BMI There is no height or weight on file to calculate BMI.  Wt Readings from Last 3 Encounters:  06/24/22 147 lb (66.7 kg)  05/14/22 149 lb 14.6 oz (68 kg)  05/09/22 149 lb 0.5 oz (67.6 kg)    General:  Patient appears comfortable at rest. HEENT: Conjunctiva and lids normal, oropharynx clear with moist mucosa. Neck: Supple, no elevated JVP or carotid bruits, no thyromegaly. Lungs: Clear to auscultation, nonlabored breathing at rest. Cardiac: Regular rate and rhythm, no S3 or significant systolic murmur,  no pericardial rub. Abdomen: Soft, nontender, no hepatomegaly, bowel sounds present, no guarding or rebound. Extremities: No pitting edema, bilateral lower EXTR amputation present, right radial pulse absent Skin: Warm and dry. Musculoskeletal: No kyphosis. Neuropsychiatric: Alert and oriented x3, affect grossly appropriate.  ECG: NSR in 05/2022  Recent Labwork: 07/06/2023: ALT 22; AST 30; B Natriuretic Peptide 266.2; TSH 1.680 08/08/2023: BUN 24; Creatinine, Ser 2.64; Hemoglobin 12.5; Platelets 459; Potassium 4.4; Sodium 144     Component Value Date/Time   CHOL 89 10/06/2022 0000   CHOL 59 (L) 09/22/2020 0923   TRIG 107 11/29/2022 0000   HDL 28 (A) 10/06/2022 0000   HDL 25 (L) 09/22/2020 0923   CHOLHDL 2.1 05/06/2022 0416   VLDL 13 05/06/2022 0416   LDLCALC 27 11/29/2022 0000   LDLCALC 19 09/22/2020 0923    Assessment and Plan:   CAD manifested by V. tach in 2023 s/p LAD PCI with LVEF 31%: No interval angina.  He has been on Plavix and Eliquis till 04/2023 after which Plavix were discontinued.  He is currently on Eliquis 5 mg twice daily.  Continue atorvastatin 80 mg nightly, Imdur 60 mg once daily, metoprolol succinate 100 mg once daily.  Not on ACE/ARB due to CKD.  ER precautions for chest pain provided.  Cardiac sarcoid: Cardiac PET in 2024 consistent with cardiac sarcoid.  Due to multiple comorbidities and poor health literacy, AHF team felt that the risk of side effects with immunosuppression would outweigh the benefits.  Follows up with advanced heart failure.  Recurrent V. Tach: VT in 2022 secondary to cocaine use, VT in 2023 secondary to LAD ischemia. Last use of cocaine was  couple of years ago. He does have cardiac PET scan from 05/2023, findings consistent with cardiac sarcoid. EP discussed about ICD implantation in August 2024 but was waiting on cardiac PET scan. Patient was hesitant in the last clinic visit to pursue ICD due to arm restrictions post ICD in the setting of bilateral lower EXTR amputations.  He might not be a candidate for subcu ICD given that he might need ATP prior to be determination.  Continue metoprolol succinate 100 mg once daily and mexiletine 150 mg 3 times daily.  Follows with electrophysiology.  Cardiomyopathy, unclear etiology: Cardiac MRI LVEF was noted to be 31% in July 2024, echocardiogram in November 2023 showed LVEF 45 to 50%.  Will update echocardiogram.  He does not have any symptoms of DOE, orthopnea or PND.  For GDMT, he is currently on beta-blocker, metoprolol succinate 100 mg once daily, not a candidate for ACE/ARB due to CKD.  Right upper extremity PAD: Intact sensory and motor strength in bilateral upper extremities.  Feeble/absent R radial pulse.  He does have imaging evidence of right subclavian artery stenosis performed in November 2023.  Paroxysmal A-fib: EKG from 2025 showed normal sinus rhythm.  Continue metoprolol succinate 100 mg once daily, Eliquis 5 mg twice daily.  HLD, at goal: Continue atorvastatin 80 mg nightly.  Goal LDL less than 55.  Nicotine abuse: Smoking cessation counseling.    Disposition:  Follow up 1 year  Signed Ismeal Heider Verne Spurr, MD, 08/25/2023 3:05 PM    [facility] at [facility] [address] [place], [place], [place] [address]

## 2023-08-25 NOTE — Patient Instructions (Signed)
Medication Instructions:  Your physician recommends that you continue on your current medications as directed. Please refer to the Current Medication list given to you today.  *If you need a refill on your cardiac medications before your next appointment, please call your pharmacy*   Lab Work: None If you have labs (blood work) drawn today and your tests are completely normal, you will receive your results only by: MyChart Message (if you have MyChart) OR A paper copy in the mail If you have any lab test that is abnormal or we need to change your treatment, we will call you to review the results.   Testing/Procedures: None   Follow-Up: At [facility], you and your health needs are our priority.  As part of our continuing mission to provide you with exceptional heart care, we have created designated Provider Care Teams.  These Care Teams include your primary Cardiologist (physician) and Advanced Practice Providers (APPs -  Physician Assistants and Nurse Practitioners) who all work together to provide you with the care you need, when you need it.  We recommend signing up for the patient portal called "MyChart".  Sign up information is provided on this After Visit Summary.  MyChart is used to connect with patients for Virtual Visits (Telemedicine).  Patients are able to view lab/test results, encounter notes, upcoming appointments, etc.  Non-urgent messages can be sent to your provider as well.   To learn more about what you can do with MyChart, go to [email or web address].    Your next appointment:   1 year(s)  Provider:   You may see Vishnu P Mallipeddi, MD or one of the following Advanced Practice Providers on your designated Care Team:   [place], PA-C  Jacolyn Reedy, [place]      Other Instructions

## 2023-08-28 ENCOUNTER — Other Ambulatory Visit: Payer: Self-pay

## 2023-08-28 DIAGNOSIS — I429 Cardiomyopathy, unspecified: Secondary | ICD-10-CM

## 2023-09-20 ENCOUNTER — Encounter (HOSPITAL_BASED_OUTPATIENT_CLINIC_OR_DEPARTMENT_OTHER): Payer: Self-pay | Admitting: Pulmonary Disease

## 2023-09-20 ENCOUNTER — Ambulatory Visit (HOSPITAL_BASED_OUTPATIENT_CLINIC_OR_DEPARTMENT_OTHER): Payer: Medicare (Managed Care) | Admitting: Pulmonary Disease

## 2023-09-20 VITALS — BP 124/76 | HR 62

## 2023-09-20 DIAGNOSIS — I10 Essential (primary) hypertension: Secondary | ICD-10-CM

## 2023-09-20 DIAGNOSIS — J432 Centrilobular emphysema: Secondary | ICD-10-CM | POA: Diagnosis not present

## 2023-09-20 DIAGNOSIS — E1169 Type 2 diabetes mellitus with other specified complication: Secondary | ICD-10-CM

## 2023-09-20 DIAGNOSIS — F17210 Nicotine dependence, cigarettes, uncomplicated: Secondary | ICD-10-CM | POA: Diagnosis not present

## 2023-09-20 DIAGNOSIS — E1122 Type 2 diabetes mellitus with diabetic chronic kidney disease: Secondary | ICD-10-CM

## 2023-09-20 DIAGNOSIS — D8685 Sarcoid myocarditis: Secondary | ICD-10-CM | POA: Diagnosis not present

## 2023-09-20 DIAGNOSIS — N184 Chronic kidney disease, stage 4 (severe): Secondary | ICD-10-CM

## 2023-09-20 NOTE — Progress Notes (Signed)
 Subjective:   PATIENT ID: Todd [name] GENDER: male DOB: [birth year], MRN: [id number]  Chief Complaint  Patient presents with   Establish Care    Sarcoidosis pt states he is feeling well    Reason for Visit: New consult for sarcoid  [name] is a 68 year old male active smoker with hx cardiac sarcoid, COPD, chronic systolic and diastolic heart failure, recurrent ventricular tachycardia 2/2 polysubstance abuse, atrial fibrillation, HLD, CKD IIIB, hypothyroidism, depression, HTN, DM2,  PAD s/p bilateral AKA, GERD who presents for new patient for cardiac sarcoid.  He currently resides at [facility]. Denies shortness of breath, cough, wheezing, chest pain, dizziness. He has a history of cardiac sarcoid seen on cardiac PET in 2024. Immunosuppression was deferred due to multiple co-morbidities  Social History: Active smoker x 45 years. Previously smoked 3/4 ppd. Currently 2 cigarettes daily for the last 7-8 years.  I have personally reviewed patient's past medical/family/social history, allergies, current medications.  Past Medical History:  Diagnosis Date   AKI (acute kidney injury) (HCC)    Alcohol abuse    Cocaine abuse (HCC) 01/25/2021   Constipated    Diabetes mellitus without complication (HCC)    Diarrhea    Elevated LFTs    Goiter    Gout    Hyperlipidemia    Hypertension    Leukocytosis    Reactive thrombocytosis    Right BKA infection (HCC) 08/2016   Right leg pain    Sepsis due to undetermined organism [facility])    Thyroid  disease    Wound infection after surgery 08/2016     Family History  Problem Relation Age of Onset   Heart disease Mother    Pneumonia Father    Diabetes Maternal Aunt    Diabetes Maternal Uncle      Social History   Occupational History   Occupation: retired    Comment: drove a Glass blower/designer  Tobacco Use   Smoking status: Every Day    Current packs/day: 0.00    Average packs/day: 0.3 packs/day  for 45.0 years (11.3 ttl pk-yrs)    Types: Cigarettes    Start date: 11/11/1969    Last attempt to quit: 11/12/2014    Years since quitting: 8.8    Passive exposure: Never   Smokeless tobacco: Never  Vaping Use   Vaping status: Never Used  Substance and Sexual Activity   Alcohol use: Not Currently    Alcohol/week: 2.0 standard drinks of alcohol    Types: 1 Glasses of wine, 1 Cans of beer per week   Drug use: Not Currently    Types: Cocaine   Sexual activity: Not Currently    Allergies  Allergen Reactions   Lisinopril  Other (See Comments)    Hyperkalemia / Renal failure     Outpatient Medications Prior to Visit  Medication Sig Dispense Refill   acetaminophen  (TYLENOL ) 325 MG tablet Take 2 tablets (650 mg total) by mouth every 6 (six) hours as needed for mild pain (or Fever >/= 101). (Patient taking differently: Take 650 mg by mouth every 6 (six) hours as needed for moderate pain (pain score 4-6).)     allopurinol  (ZYLOPRIM ) 100 MG tablet Take 1 tablet (100 mg total) by mouth daily. (Patient taking differently: Take 100 mg by mouth in the morning.) 30 tablet 1   apixaban  (ELIQUIS ) 5 MG TABS tablet Take 1 tablet (5 mg total) by mouth 2 (two) times daily. 60 tablet 5   Ascorbic Acid   500 MG CAPS Take 500 mg by mouth daily.     atorvastatin  (LIPITOR) 80 MG tablet Take 1 tablet (80 mg total) by mouth daily. 90 tablet 3   calcium  carbonate (TUMS - DOSED IN MG ELEMENTAL CALCIUM ) 500 MG chewable tablet Chew 1,000 mg by mouth 3 (three) times daily before meals.     Cholecalciferol  (VITAMIN D3) 50 MCG (2000 UT) capsule Take 2,000 Units by mouth in the morning.     cyanocobalamin  (V-R VITAMIN B-12) 500 MCG tablet Take 1 tablet (500 mcg total) by mouth in the morning. 30 tablet 6   ferrous sulfate 324 MG TBEC Take 324 mg by mouth.     folic acid  (FOLVITE ) 1 MG tablet Take 1 mg by mouth in the morning.     Glucagon, rDNA, (GLUCAGON EMERGENCY) 1 MG KIT Inject 1 mg into the muscle as needed  (hypoglycemia).     hydrALAZINE  (APRESOLINE ) 25 MG tablet Take 1 tablet (25 mg total) by mouth 3 (three) times daily. 270 tablet 3   isosorbide  mononitrate (IMDUR ) 60 MG 24 hr tablet Take 1 tablet (60 mg total) by mouth daily. 90 tablet 3   levothyroxine  (SYNTHROID ) 75 MCG tablet Take 1 tablet (75 mcg total) by mouth daily before breakfast. (Patient taking differently: Take 100 mcg by mouth daily before breakfast.) 90 tablet 3   lidocaine  (LIDODERM ) 5 % Place 1 patch onto the skin daily. Remove & Discard patch within 12 hours or as directed by MD 30 patch 0   magnesium  oxide (MAG-OX) 400 (240 Mg) MG tablet Take 400 mg by mouth daily.     metoprolol  succinate (TOPROL -XL) 100 MG 24 hr tablet Take 1 tablet (100 mg total) by mouth daily. 90 tablet 3   mexiletine (MEXITIL ) 150 MG capsule Take 1 capsule (150 mg total) by mouth every 8 (eight) hours.     mirtazapine  (REMERON ) 15 MG tablet Take 15 mg by mouth at bedtime.     Multiple Vitamin (MULTIVITAMIN WITH MINERALS) TABS tablet Take 1 tablet by mouth daily. (Patient taking differently: Take 1 tablet by mouth in the morning.)     Nutritional Supplements (NUTRITIONAL DRINK) LIQD Take 240 mLs by mouth daily at 12 noon. House Supplement     pantoprazole  (PROTONIX ) 40 MG tablet Take 1 tablet (40 mg total) by mouth 2 (two) times daily.     polyethylene glycol (MIRALAX  / GLYCOLAX ) 17 g packet Take 17 g by mouth daily. (Patient taking differently: Take 17 g by mouth in the morning.) 14 each 0   sertraline  (ZOLOFT ) 50 MG tablet Take 1 tablet (50 mg total) by mouth at bedtime. 30 tablet 1   sodium bicarbonate  650 MG tablet Take 650 mg by mouth 3 (three) times daily before meals.     thiamine  100 MG tablet Take 1 tablet (100 mg total) by mouth daily. (Patient taking differently: Take 100 mg by mouth in the morning.) 30 tablet 3   FIASP  FLEXTOUCH 100 UNIT/ML FlexTouch Pen Inject 0-6 Units into the skin 3 (three) times daily before meals. Per SSI (Patient not taking:  Reported on 09/20/2023)     No facility-administered medications prior to visit.    Review of Systems  Constitutional:  Negative for chills, diaphoresis, fever, malaise/fatigue and weight loss.  HENT:  Negative for congestion.   Respiratory:  Negative for cough, hemoptysis, sputum production, shortness of breath and wheezing.   Cardiovascular:  Negative for chest pain, palpitations and leg swelling.     Objective:  Vitals:   09/20/23 1428  BP: 124/76  Pulse: 62  SpO2: 98%   SpO2: 98 %  Physical Exam: General: Well-appearing, no acute distress HENT: [place], AT Eyes: EOMI, no scleral icterus Respiratory: Clear to auscultation bilaterally.  No crackles, wheezing or rales Cardiovascular: RRR, -M/R/G, no JVD Extremities:-Edema,-tenderness Neuro: AAO x4, CNII-XII grossly intact Psych: Normal mood, normal affect  Data Reviewed:  Imaging: CT Chest lung screen 01/16/23 Small pulmonary nodule in the LLL 3 mm, chronic scarring in lung bases bilaterally, unchanged compared to prior. Mild centrilobular and paraseptal emphysema PET/CT Myocardial Sarcoid 05/17/23 - Consistent with active myocardial inflammation/sarcoidosis. No extracardiac findings  PFT: None on file  Labs:    Latest Ref Rng & Units 08/08/2023    4:13 PM 11/29/2022   12:00 AM 10/19/2022   10:51 AM  CBC  WBC 3.4 - 10.8 x10E3/uL 8.6   9.8   Hemoglobin 13.0 - 17.7 g/dL 13.0  86.5     78.4   Hematocrit 37.5 - 51.0 % 38.4  33     36.0   Platelets 150 - 450 x10E3/uL 459   562      This result is from an external source.  Stable mild anemia     Latest Ref Rng & Units 08/08/2023    4:13 PM 07/06/2023    3:44 PM 12/12/2022   12:00 AM  CMP  Glucose 70 - 99 mg/dL 66  89    BUN 8 - 27 mg/dL 24  46    Creatinine 6.96 - 1.27 mg/dL 2.95  2.84    Sodium 132 - 144 mmol/L 144  141    Potassium 3.5 - 5.2 mmol/L 4.4  4.9    Chloride 96 - 106 mmol/L 106  113    CO2 20 - 29 mmol/L 21  21    Calcium  8.6 - 10.2 mg/dL 8.1  7.2  7.8       Total Protein 6.5 - 8.1 g/dL  7.2    Total Bilirubin 0.0 - 1.2 mg/dL  0.5    Alkaline Phos 38 - 126 U/L  142    AST 15 - 41 U/L  30    ALT 0 - 44 U/L  22       This result is from an external source.  CKD IV      Assessment & Plan:   Discussion: 68 year old male active smoker with hx cardiac sarcoid, COPD, chronic systolic and diastolic heart failure, recurrent ventricular tachycardia 2/2 polysubstance abuse, atrial fibrillation, HLD, CKD IIIB, hypothyroidism, depression, HTN, DM2,  PAD s/p bilateral AKA, GERD who presents for new patient for cardiac sarcoid. History reviewed and due to patient's co-morbidities, not a candidate for immunosuppressants from a pulmonary standpoint. Cardiology also felt risks outweighed benefit for immunosuppression.  Will follow-up with PFTs and if moderate/severe, consider bronchodilators. Continue smoking cessation.  Emphysema Tobacco abuse Asymptomatic. No indication for bronchodilators --ORDER pulmonary function tests --We discussed smoking cessation for 3 minutes  Cardiac sarcoid --Cardiology/Advanced heart failure team felt risks outweighed benefits of immunosuppression --No pulmonary involvement  CKD IV: Multiple co-morbidities including HTN, DM2 and sarcoid --REFER to Nephrology --BMET today  Health Maintenance Immunization History  Administered Date(s) Administered   Influenza,inj,Quad PF,6+ Mos 05/20/2015, 04/07/2017, 03/09/2018, 05/01/2019   Influenza-Unspecified 05/10/2011, 04/18/2022   PFIZER Comirnaty(Gray Top)Covid-19 Tri-Sucrose Vaccine 07/28/2020, 08/18/2020, 08/18/2020   PPD Test 09/16/2016   CT Lung Screen - Consider referral at next visit  Orders Placed This Encounter  Procedures  Basic Metabolic Panel (BMET)   Ambulatory referral to Nephrology    Referral Priority:   Routine    Referral Type:   Consultation    Referral Reason:   Specialty Services Required    Requested Specialty:   Nephrology    Number of Visits  Requested:   1   Pulmonary function test    Standing Status:   Future    Expiration Date:   09/19/2024    Where should this test be performed?:   Outpatient Pulmonary    What type of PFT is being ordered?:   Full PFT  No orders of the defined types were placed in this encounter.   Return for after PFT with NP.  I have spent a total time of 40-minutes on the day of the appointment reviewing prior documentation, coordinating care and discussing medical diagnosis and plan with the patient/family. Imaging, labs and tests included in this note have been reviewed and interpreted independently by me.  Desarea Ohagan Genetta Kenning, MD [facility] 09/20/2023 2:44 PM

## 2023-09-20 NOTE — Patient Instructions (Signed)
 Emphysema Tobacco abuse Asymptomatic. No indication for bronchodilators --ORDER pulmonary function tests --We discussed smoking cessation for 3 minutes  Cardiac sarcoid --Cardiology/Advanced heart failure team felt risks outweighed benefits of immunosuppression --No pulmonary involvement  CKD IV: Multiple co-morbidities including HTN, DM2 and sarcoid --REFER to Nephrology --ORDER labs: BMET today

## 2023-09-21 ENCOUNTER — Ambulatory Visit (HOSPITAL_COMMUNITY): Payer: Medicare (Managed Care)

## 2023-09-21 ENCOUNTER — Encounter (HOSPITAL_BASED_OUTPATIENT_CLINIC_OR_DEPARTMENT_OTHER): Payer: Self-pay | Admitting: Pulmonary Disease

## 2023-09-21 LAB — BASIC METABOLIC PANEL WITH GFR
BUN/Creatinine Ratio: 10 (ref 10–24)
BUN: 27 mg/dL (ref 8–27)
CO2: 19 mmol/L — ABNORMAL LOW (ref 20–29)
Calcium: 7.4 mg/dL — ABNORMAL LOW (ref 8.6–10.2)
Chloride: 109 mmol/L — ABNORMAL HIGH (ref 96–106)
Creatinine, Ser: 2.61 mg/dL — ABNORMAL HIGH (ref 0.76–1.27)
Glucose: 73 mg/dL (ref 70–99)
Potassium: 4.5 mmol/L (ref 3.5–5.2)
Sodium: 147 mmol/L — ABNORMAL HIGH (ref 134–144)
eGFR: 26 mL/min/{1.73_m2} — ABNORMAL LOW (ref >59)

## 2023-10-04 ENCOUNTER — Ambulatory Visit (HOSPITAL_COMMUNITY)
Admission: RE | Admit: 2023-10-04 | Discharge: 2023-10-04 | Disposition: A | Discharge status: Home / Self Care | Payer: Medicare (Managed Care) | Source: Ambulatory Visit | Attending: Internal Medicine | Admitting: Internal Medicine

## 2023-10-04 DIAGNOSIS — E119 Type 2 diabetes mellitus without complications: Secondary | ICD-10-CM | POA: Diagnosis not present

## 2023-10-04 DIAGNOSIS — E785 Hyperlipidemia, unspecified: Secondary | ICD-10-CM | POA: Insufficient documentation

## 2023-10-04 DIAGNOSIS — I429 Cardiomyopathy, unspecified: Secondary | ICD-10-CM | POA: Diagnosis present

## 2023-10-04 DIAGNOSIS — I4891 Unspecified atrial fibrillation: Secondary | ICD-10-CM | POA: Insufficient documentation

## 2023-10-04 DIAGNOSIS — I509 Heart failure, unspecified: Secondary | ICD-10-CM | POA: Diagnosis not present

## 2023-10-04 DIAGNOSIS — I110 Hypertensive heart disease with heart failure: Secondary | ICD-10-CM | POA: Insufficient documentation

## 2023-10-04 DIAGNOSIS — R000 Tachycardia, unspecified: Secondary | ICD-10-CM | POA: Insufficient documentation

## 2023-10-04 DIAGNOSIS — I428 Other cardiomyopathies: Secondary | ICD-10-CM | POA: Diagnosis not present

## 2023-10-04 LAB — ECHOCARDIOGRAM COMPLETE
AR max vel: 2.03 cm2
AV Area VTI: 2.23 cm2
AV Area mean vel: 2.08 cm2
AV Mean grad: 1.7 mmHg
AV Peak grad: 3.7 mmHg
Ao pk vel: 0.96 m/s
Area-P 1/2: 2.99 cm2
S' Lateral: 3.4 cm

## 2023-10-04 NOTE — Progress Notes (Signed)
*  PRELIMINARY RESULTS* Echocardiogram 2D Echocardiogram has been performed.  [name] 10/04/2023, 11:08 AM

## 2023-10-13 ENCOUNTER — Inpatient Hospital Stay: Payer: Medicare (Managed Care) | Attending: Hematology

## 2023-10-13 DIAGNOSIS — I700 Atherosclerosis of aorta: Secondary | ICD-10-CM | POA: Insufficient documentation

## 2023-10-13 DIAGNOSIS — Z89612 Acquired absence of left leg above knee: Secondary | ICD-10-CM | POA: Diagnosis not present

## 2023-10-13 DIAGNOSIS — Z79899 Other long term (current) drug therapy: Secondary | ICD-10-CM | POA: Insufficient documentation

## 2023-10-13 DIAGNOSIS — Z8249 Family history of ischemic heart disease and other diseases of the circulatory system: Secondary | ICD-10-CM | POA: Diagnosis not present

## 2023-10-13 DIAGNOSIS — Z89511 Acquired absence of right leg below knee: Secondary | ICD-10-CM | POA: Diagnosis not present

## 2023-10-13 DIAGNOSIS — I480 Paroxysmal atrial fibrillation: Secondary | ICD-10-CM | POA: Diagnosis not present

## 2023-10-13 DIAGNOSIS — R7989 Other specified abnormal findings of blood chemistry: Secondary | ICD-10-CM | POA: Diagnosis not present

## 2023-10-13 DIAGNOSIS — I129 Hypertensive chronic kidney disease with stage 1 through stage 4 chronic kidney disease, or unspecified chronic kidney disease: Secondary | ICD-10-CM | POA: Insufficient documentation

## 2023-10-13 DIAGNOSIS — N184 Chronic kidney disease, stage 4 (severe): Secondary | ICD-10-CM | POA: Insufficient documentation

## 2023-10-13 DIAGNOSIS — E1122 Type 2 diabetes mellitus with diabetic chronic kidney disease: Secondary | ICD-10-CM | POA: Insufficient documentation

## 2023-10-13 DIAGNOSIS — I2510 Atherosclerotic heart disease of native coronary artery without angina pectoris: Secondary | ICD-10-CM | POA: Diagnosis not present

## 2023-10-13 DIAGNOSIS — J439 Emphysema, unspecified: Secondary | ICD-10-CM | POA: Diagnosis not present

## 2023-10-13 DIAGNOSIS — D649 Anemia, unspecified: Secondary | ICD-10-CM | POA: Insufficient documentation

## 2023-10-13 DIAGNOSIS — D72828 Other elevated white blood cell count: Secondary | ICD-10-CM | POA: Insufficient documentation

## 2023-10-13 DIAGNOSIS — N200 Calculus of kidney: Secondary | ICD-10-CM | POA: Insufficient documentation

## 2023-10-13 DIAGNOSIS — Z89611 Acquired absence of right leg above knee: Secondary | ICD-10-CM | POA: Insufficient documentation

## 2023-10-13 DIAGNOSIS — F17210 Nicotine dependence, cigarettes, uncomplicated: Secondary | ICD-10-CM | POA: Diagnosis not present

## 2023-10-13 DIAGNOSIS — E785 Hyperlipidemia, unspecified: Secondary | ICD-10-CM | POA: Insufficient documentation

## 2023-10-13 DIAGNOSIS — Z89512 Acquired absence of left leg below knee: Secondary | ICD-10-CM | POA: Insufficient documentation

## 2023-10-13 DIAGNOSIS — R5383 Other fatigue: Secondary | ICD-10-CM | POA: Diagnosis not present

## 2023-10-13 DIAGNOSIS — Z888 Allergy status to other drugs, medicaments and biological substances status: Secondary | ICD-10-CM | POA: Insufficient documentation

## 2023-10-13 DIAGNOSIS — Z7901 Long term (current) use of anticoagulants: Secondary | ICD-10-CM | POA: Insufficient documentation

## 2023-10-13 DIAGNOSIS — E538 Deficiency of other specified B group vitamins: Secondary | ICD-10-CM

## 2023-10-13 DIAGNOSIS — D75839 Thrombocytosis, unspecified: Secondary | ICD-10-CM | POA: Diagnosis present

## 2023-10-13 DIAGNOSIS — Z833 Family history of diabetes mellitus: Secondary | ICD-10-CM | POA: Diagnosis not present

## 2023-10-13 DIAGNOSIS — N1832 Chronic kidney disease, stage 3b: Secondary | ICD-10-CM

## 2023-10-13 LAB — COMPREHENSIVE METABOLIC PANEL WITH GFR
ALT: 22 U/L (ref 0–44)
AST: 29 U/L (ref 15–41)
Albumin: 3 g/dL — ABNORMAL LOW (ref 3.5–5.0)
Alkaline Phosphatase: 228 U/L — ABNORMAL HIGH (ref 38–126)
Anion gap: 9 (ref 5–15)
BUN: 29 mg/dL — ABNORMAL HIGH (ref 8–23)
CO2: 22 mmol/L (ref 22–32)
Calcium: 7.1 mg/dL — ABNORMAL LOW (ref 8.9–10.3)
Chloride: 111 mmol/L (ref 98–111)
Creatinine, Ser: 3.21 mg/dL — ABNORMAL HIGH (ref 0.61–1.24)
GFR, Estimated: 20 mL/min — ABNORMAL LOW (ref >60)
Glucose, Bld: 86 mg/dL (ref 70–99)
Potassium: 4 mmol/L (ref 3.5–5.1)
Sodium: 142 mmol/L (ref 135–145)
Total Bilirubin: 0.5 mg/dL (ref 0.0–1.2)
Total Protein: 7.4 g/dL (ref 6.5–8.1)

## 2023-10-13 LAB — CBC WITH DIFFERENTIAL/PLATELET
Abs Immature Granulocytes: 0.02 10*3/uL (ref 0.00–0.07)
Basophils Absolute: 0.1 10*3/uL (ref 0.0–0.1)
Basophils Relative: 1 %
Eosinophils Absolute: 0.5 10*3/uL (ref 0.0–0.5)
Eosinophils Relative: 5 %
HCT: 40.7 % (ref 39.0–52.0)
Hemoglobin: 12.9 g/dL — ABNORMAL LOW (ref 13.0–17.0)
Immature Granulocytes: 0 %
Lymphocytes Relative: 30 %
Lymphs Abs: 2.9 10*3/uL (ref 0.7–4.0)
MCH: 27.8 pg (ref 26.0–34.0)
MCHC: 31.7 g/dL (ref 30.0–36.0)
MCV: 87.7 fL (ref 80.0–100.0)
Monocytes Absolute: 0.6 10*3/uL (ref 0.1–1.0)
Monocytes Relative: 6 %
Neutro Abs: 5.7 10*3/uL (ref 1.7–7.7)
Neutrophils Relative %: 58 %
Platelets: 465 10*3/uL — ABNORMAL HIGH (ref 150–400)
RBC: 4.64 MIL/uL (ref 4.22–5.81)
RDW: 16.2 % — ABNORMAL HIGH (ref 11.5–15.5)
WBC: 9.8 10*3/uL (ref 4.0–10.5)
nRBC: 0 % (ref 0.0–0.2)

## 2023-10-13 LAB — IRON AND TIBC
Iron: 52 ug/dL (ref 45–182)
Saturation Ratios: 19 % (ref 17.9–39.5)
TIBC: 274 ug/dL (ref 250–450)
UIBC: 222 ug/dL

## 2023-10-13 LAB — SEDIMENTATION RATE: Sed Rate: 30 mm/h — ABNORMAL HIGH (ref 0–16)

## 2023-10-13 LAB — C-REACTIVE PROTEIN: CRP: 0.7 mg/dL (ref <1.0)

## 2023-10-13 LAB — FERRITIN: Ferritin: 344 ng/mL — ABNORMAL HIGH (ref 24–336)

## 2023-10-13 LAB — VITAMIN B12: Vitamin B-12: 1140 pg/mL — ABNORMAL HIGH (ref 180–914)

## 2023-10-13 LAB — LACTATE DEHYDROGENASE: LDH: 163 U/L (ref 98–192)

## 2023-10-17 LAB — METHYLMALONIC ACID, SERUM: Methylmalonic Acid, Quantitative: 392 nmol/L — ABNORMAL HIGH (ref 0–378)

## 2023-10-24 NOTE — Progress Notes (Unsigned)
 @Patient  ID: Todd [name], male    DOB: 1955/10/17, 68 y.o.   MRN: [id number]  No chief complaint on file.   Referring provider: Quillian Brunt, MD  HPI: 68 year old male, active smoker followed for sarcoidosis and COPD.  He is a patient of Dr. Jacqualyn Mates and last seen in office 09/20/2023.  Past medical history significant for chronic systolic and diastolic heart failure, recurrent ventricular tachycardia related to polysubstance abuse, A-fib, HLD, CKD IIIB, hypothyroidism, depression, hypertension, DM2, PAD status post bilateral AKA, GERD.   TEST/EVENTS:  01/16/2023 for lung cancer screening CT: Lung RADS 2S.  Atherosclerosis.  Mild diffuse bronchial wall thickening.  Emphysema. 05/17/2023 cardiac PET: Active myocardial inflammation/sarcoidosis.  EF 34%.  No suspicious nodularity in the visualized lung fields. 08/08/2023 ACE 89  09/20/2023: OV with Dr. Washington Hacker for consult.  Cardiac sarcoid seen on cardiac PET in 2024.  Immunosuppression deferred due to multiple comorbidities.  No shortness of breath, cough, wheezing, chest pain, dizziness.  PFTs ordered.  If he has any evidence of obstructive lung disease, consider bronchodilators.  Smoking cessation.  No pulmonary involvement of sarcoid.  Referred to nephrology given declining kidney function.  Allergies  Allergen Reactions   Lisinopril  Other (See Comments)    Hyperkalemia / Renal failure    Immunization History  Administered Date(s) Administered   Influenza,inj,Quad PF,6+ Mos 05/20/2015, 04/07/2017, 03/09/2018, 05/01/2019   Influenza-Unspecified 05/10/2011, 04/18/2022   PFIZER Comirnaty(Gray Top)Covid-19 Tri-Sucrose Vaccine 07/28/2020, 08/18/2020, 08/18/2020   PPD Test 09/16/2016    Past Medical History:  Diagnosis Date   AKI (acute kidney injury) (HCC)    Alcohol abuse    Cocaine abuse (HCC) 01/25/2021   Constipated    Diabetes mellitus without complication (HCC)    Diarrhea    Elevated LFTs    Goiter    Gout     Hyperlipidemia    Hypertension    Leukocytosis    Reactive thrombocytosis    Right BKA infection (HCC) 08/2016   Right leg pain    Sepsis due to undetermined organism (HCC)    Thyroid  disease    Wound infection after surgery 08/2016    Tobacco History: Social History   Tobacco Use  Smoking Status Every Day   Current packs/day: 0.00   Average packs/day: 0.3 packs/day for 45.0 years (11.3 ttl pk-yrs)   Types: Cigarettes   Start date: 11/11/1969   Last attempt to quit: 11/12/2014   Years since quitting: 8.9   Passive exposure: Never  Smokeless Tobacco Never   Ready to quit: Not Answered Counseling given: Not Answered   Outpatient Medications Prior to Visit  Medication Sig Dispense Refill   acetaminophen  (TYLENOL ) 325 MG tablet Take 2 tablets (650 mg total) by mouth every 6 (six) hours as needed for mild pain (or Fever >/= 101). (Patient taking differently: Take 650 mg by mouth every 6 (six) hours as needed for moderate pain (pain score 4-6).)     allopurinol  (ZYLOPRIM ) 100 MG tablet Take 1 tablet (100 mg total) by mouth daily. (Patient taking differently: Take 100 mg by mouth in the morning.) 30 tablet 1   apixaban  (ELIQUIS ) 5 MG TABS tablet Take 1 tablet (5 mg total) by mouth 2 (two) times daily. 60 tablet 5   Ascorbic Acid  500 MG CAPS Take 500 mg by mouth daily.     atorvastatin  (LIPITOR) 80 MG tablet Take 1 tablet (80 mg total) by mouth daily. 90 tablet 3   calcium  carbonate (TUMS - DOSED IN MG ELEMENTAL CALCIUM ) 500  MG chewable tablet Chew 1,000 mg by mouth 3 (three) times daily before meals.     Cholecalciferol  (VITAMIN D3) 50 MCG (2000 UT) capsule Take 2,000 Units by mouth in the morning.     cyanocobalamin  (V-R VITAMIN B-12) 500 MCG tablet Take 1 tablet (500 mcg total) by mouth in the morning. 30 tablet 6   ferrous sulfate 324 MG TBEC Take 324 mg by mouth.     FIASP  FLEXTOUCH 100 UNIT/ML FlexTouch Pen Inject 0-6 Units into the skin 3 (three) times daily before meals. Per SSI  (Patient not taking: Reported on 09/20/2023)     folic acid  (FOLVITE ) 1 MG tablet Take 1 mg by mouth in the morning.     Glucagon, rDNA, (GLUCAGON EMERGENCY) 1 MG KIT Inject 1 mg into the muscle as needed (hypoglycemia).     hydrALAZINE  (APRESOLINE ) 25 MG tablet Take 1 tablet (25 mg total) by mouth 3 (three) times daily. 270 tablet 3   isosorbide  mononitrate (IMDUR ) 60 MG 24 hr tablet Take 1 tablet (60 mg total) by mouth daily. 90 tablet 3   levothyroxine  (SYNTHROID ) 75 MCG tablet Take 1 tablet (75 mcg total) by mouth daily before breakfast. (Patient taking differently: Take 100 mcg by mouth daily before breakfast.) 90 tablet 3   lidocaine  (LIDODERM ) 5 % Place 1 patch onto the skin daily. Remove & Discard patch within 12 hours or as directed by MD 30 patch 0   magnesium  oxide (MAG-OX) 400 (240 Mg) MG tablet Take 400 mg by mouth daily.     metoprolol  succinate (TOPROL -XL) 100 MG 24 hr tablet Take 1 tablet (100 mg total) by mouth daily. 90 tablet 3   mexiletine (MEXITIL ) 150 MG capsule Take 1 capsule (150 mg total) by mouth every 8 (eight) hours.     mirtazapine  (REMERON ) 15 MG tablet Take 15 mg by mouth at bedtime.     Multiple Vitamin (MULTIVITAMIN WITH MINERALS) TABS tablet Take 1 tablet by mouth daily. (Patient taking differently: Take 1 tablet by mouth in the morning.)     Nutritional Supplements (NUTRITIONAL DRINK) LIQD Take 240 mLs by mouth daily at 12 noon. House Supplement     pantoprazole  (PROTONIX ) 40 MG tablet Take 1 tablet (40 mg total) by mouth 2 (two) times daily.     polyethylene glycol (MIRALAX  / GLYCOLAX ) 17 g packet Take 17 g by mouth daily. (Patient taking differently: Take 17 g by mouth in the morning.) 14 each 0   sertraline  (ZOLOFT ) 50 MG tablet Take 1 tablet (50 mg total) by mouth at bedtime. 30 tablet 1   sodium bicarbonate  650 MG tablet Take 650 mg by mouth 3 (three) times daily before meals.     thiamine  100 MG tablet Take 1 tablet (100 mg total) by mouth daily. (Patient  taking differently: Take 100 mg by mouth in the morning.) 30 tablet 3   No facility-administered medications prior to visit.     Review of Systems:   Constitutional: No weight loss or gain, night sweats, fevers, chills, fatigue, or lassitude. HEENT: No headaches, difficulty swallowing, tooth/dental problems, or sore throat. No sneezing, itching, ear ache, nasal congestion, or post nasal drip CV:  No chest pain, orthopnea, PND, swelling in lower extremities, anasarca, dizziness, palpitations, syncope Resp: No shortness of breath with exertion or at rest. No excess mucus or change in color of mucus. No productive or non-productive. No hemoptysis. No wheezing.  No chest wall deformity GI:  No heartburn, indigestion, abdominal pain, nausea, vomiting, diarrhea,  change in bowel habits, loss of appetite, bloody stools.  GU: No dysuria, change in color of urine, urgency or frequency.  No flank pain, no hematuria  Skin: No rash, lesions, ulcerations MSK:  No joint pain or swelling.  No decreased range of motion.  No back pain. Neuro: No dizziness or lightheadedness.  Psych: No depression or anxiety. Mood stable.     Physical Exam:  There were no vitals taken for this visit.  GEN: Pleasant, interactive, well-nourished/chronically-ill appearing/acutely-ill appearing/poorly-nourished/morbidly obese; in no acute distress.****** HEENT:  Normocephalic and atraumatic. EACs patent bilaterally. TM pearly gray with present light reflex bilaterally. PERRLA. Sclera white. Nasal turbinates pink, moist and patent bilaterally. No rhinorrhea present. Oropharynx pink and moist, without exudate or edema. No lesions, ulcerations, or postnasal drip.  NECK:  Supple w/ fair ROM. No JVD present. Normal carotid impulses w/o bruits. Thyroid  symmetrical with no goiter or nodules palpated. No lymphadenopathy.   CV: RRR, no m/r/g, no peripheral edema. Pulses intact, +2 bilaterally. No cyanosis, pallor or  clubbing. PULMONARY:  Unlabored, regular breathing. Clear bilaterally A&P w/o wheezes/rales/rhonchi. No accessory muscle use.  GI: BS present and normoactive. Soft, non-tender to palpation. No organomegaly or masses detected. No CVA tenderness. MSK: No erythema, warmth or tenderness. Cap refil <2 sec all extrem. No deformities or joint swelling noted.  Neuro: A/Ox3. No focal deficits noted.   Skin: Warm, no lesions or rashe Psych: Normal affect and behavior. Judgement and thought content appropriate.     Lab Results:  CBC    Component Value Date/Time   WBC 9.8 10/13/2023 1002   RBC 4.64 10/13/2023 1002   HGB 12.9 (L) 10/13/2023 1002   HGB 12.5 (L) 08/08/2023 1613   HCT 40.7 10/13/2023 1002   HCT 38.4 08/08/2023 1613   PLT 465 (H) 10/13/2023 1002   PLT 459 (H) 08/08/2023 1613   MCV 87.7 10/13/2023 1002   MCV 86 08/08/2023 1613   MCH 27.8 10/13/2023 1002   MCHC 31.7 10/13/2023 1002   RDW 16.2 (H) 10/13/2023 1002   RDW 15.2 08/08/2023 1613   LYMPHSABS 2.9 10/13/2023 1002   LYMPHSABS 2.5 08/08/2023 1613   MONOABS 0.6 10/13/2023 1002   EOSABS 0.5 10/13/2023 1002   EOSABS 0.5 (H) 08/08/2023 1613   BASOSABS 0.1 10/13/2023 1002   BASOSABS 0.1 08/08/2023 1613    BMET    Component Value Date/Time   NA 142 10/13/2023 1002   NA 147 (H) 09/20/2023 1511   K 4.0 10/13/2023 1002   CL 111 10/13/2023 1002   CO2 22 10/13/2023 1002   GLUCOSE 86 10/13/2023 1002   BUN 29 (H) 10/13/2023 1002   BUN 27 09/20/2023 1511   CREATININE 3.21 (H) 10/13/2023 1002   CALCIUM  7.1 (L) 10/13/2023 1002   GFRNONAA 20 (L) 10/13/2023 1002   GFRAA 90 08/06/2020 0805    BNP    Component Value Date/Time   BNP 266.2 (H) 07/06/2023 1544     Imaging:  ECHOCARDIOGRAM COMPLETE Result Date: 10/04/2023    ECHOCARDIOGRAM REPORT   Patient Name:   THAXTON PARISO Date of Exam: 10/04/2023 Medical Rec #:  [id number]       Height:       60.0 in Accession #:    [id number]      Weight:       147.0 lb Date of  Birth:  1955/12/26      BSA:          1.638 m Patient Age:    61 years  BP:           163/82 mmHg Patient Gender: M               HR:           57 bpm. Exam Location:  [facility] Procedure: 2D Echo, Cardiac Doppler and Color Doppler (Both Spectral and Color            Flow Doppler were utilized during procedure). Indications:    Cardiomyopathy-Unspecified I42.9  History:        Patient has prior history of Echocardiogram examinations, most                 recent 05/04/2022. CHF and Cardiomyopathy, Arrythmias:Atrial                 Fibrillation and Tachycardia; Risk Factors:Hypertension,                 Diabetes and Dyslipidemia.  Sonographer:    Denese Finn RCS Referring Phys: [id number] VISHNU P MALLIPEDDI IMPRESSIONS  1. Left ventricular ejection fraction, by estimation, is 45 to 50%. The left ventricle has mildly decreased function. Left ventricular endocardial border not optimally defined to evaluate regional wall motion. There is moderate left ventricular hypertrophy. Left ventricular diastolic parameters are indeterminate.  2. Right ventricular systolic function is normal. The right ventricular size is normal. Tricuspid regurgitation signal is inadequate for assessing PA pressure.  3. Left atrial size was severely dilated.  4. The mitral valve is normal in structure. Trivial mitral valve regurgitation. No evidence of mitral stenosis.  5. The aortic valve is tricuspid. There is mild calcification of the aortic valve. There is mild thickening of the aortic valve. Aortic valve regurgitation is not visualized. No aortic stenosis is present.  6. The inferior vena cava is normal in size with greater than 50% respiratory variability, suggesting right atrial pressure of 3 mmHg. FINDINGS  Left Ventricle: Left ventricular ejection fraction, by estimation, is 45 to 50%. The left ventricle has mildly decreased function. Left ventricular endocardial border not optimally defined to evaluate regional wall motion. The  left ventricular internal cavity size was normal in size. There is moderate left ventricular hypertrophy. Left ventricular diastolic parameters are indeterminate. Right Ventricle: The right ventricular size is normal. Right vetricular wall thickness was not well visualized. Right ventricular systolic function is normal. Tricuspid regurgitation signal is inadequate for assessing PA pressure. Left Atrium: Left atrial size was severely dilated. Right Atrium: Right atrial size was normal in size. Pericardium: There is no evidence of pericardial effusion. Mitral Valve: The mitral valve is normal in structure. Trivial mitral valve regurgitation. No evidence of mitral valve stenosis. Tricuspid Valve: The tricuspid valve is normal in structure. Tricuspid valve regurgitation is not demonstrated. No evidence of tricuspid stenosis. Aortic Valve: The aortic valve is tricuspid. There is mild calcification of the aortic valve. There is mild thickening of the aortic valve. There is mild aortic valve annular calcification. Aortic valve regurgitation is not visualized. No aortic stenosis  is present. Aortic valve mean gradient measures 1.7 mmHg. Aortic valve peak gradient measures 3.7 mmHg. Aortic valve area, by VTI measures 2.23 cm. Pulmonic Valve: The pulmonic valve was not well visualized. Pulmonic valve regurgitation is not visualized. No evidence of pulmonic stenosis. Aorta: The aortic root is normal in size and structure. Venous: The inferior vena cava is normal in size with greater than 50% respiratory variability, suggesting right atrial pressure of 3 mmHg. IAS/Shunts: No atrial level shunt detected by  color flow Doppler.  LEFT VENTRICLE PLAX 2D LVIDd:         5.20 cm   Diastology LVIDs:         3.40 cm   LV e' medial:    4.13 cm/s LV PW:         1.30 cm   LV E/e' medial:  11.1 LV IVS:        1.20 cm   LV e' lateral:   4.28 cm/s LVOT diam:     2.00 cm   LV E/e' lateral: 10.7 LV SV:         47 LV SV Index:   29 LVOT Area:      3.14 cm  RIGHT VENTRICLE RV S prime:     12.80 cm/s TAPSE (M-mode): 2.3 cm LEFT ATRIUM              Index        RIGHT ATRIUM           Index LA diam:        4.40 cm  2.69 cm/m   RA Area:     17.90 cm LA Vol (A2C):   114.0 ml 69.61 ml/m  RA Volume:   45.40 ml  27.72 ml/m LA Vol (A4C):   92.4 ml  56.42 ml/m LA Biplane Vol: 102.0 ml 62.28 ml/m  AORTIC VALVE AV Area (Vmax):    2.03 cm AV Area (Vmean):   2.08 cm AV Area (VTI):     2.23 cm AV Vmax:           96.23 cm/s AV Vmean:          60.977 cm/s AV VTI:            0.213 m AV Peak Grad:      3.7 mmHg AV Mean Grad:      1.7 mmHg LVOT Vmax:         62.30 cm/s LVOT Vmean:        40.300 cm/s LVOT VTI:          0.151 m LVOT/AV VTI ratio: 0.71  AORTA Ao Root diam: 3.40 cm MITRAL VALVE MV Area (PHT): 2.99 cm    SHUNTS MV Decel Time: 254 msec    Systemic VTI:  0.15 m MV E velocity: 46.00 cm/s  Systemic Diam: 2.00 cm MV A velocity: 53.10 cm/s MV E/A ratio:  0.87 Armida Lander MD Electronically signed by Armida Lander MD Signature Date/Time: 10/04/2023/11:29:44 AM    Final     Administration History     None           No data to display          No results found for: "NITRICOXIDE"      Assessment & Plan:   No problem-specific Assessment & Plan notes found for this encounter.   Advised if symptoms do not improve or worsen, to please contact office for sooner follow up or seek emergency care.   I spent *** minutes of dedicated to the care of this patient on the date of this encounter to include pre-visit review of records, face-to-face time with the patient discussing conditions above, post visit ordering of testing, clinical documentation with the electronic health record, making appropriate referrals as documented, and communicating necessary findings to members of the patients care team.  Roetta Clarke, NP 10/24/2023  Pt aware and understands NP's role.

## 2023-10-25 ENCOUNTER — Ambulatory Visit (HOSPITAL_BASED_OUTPATIENT_CLINIC_OR_DEPARTMENT_OTHER): Payer: Medicare (Managed Care) | Admitting: Nurse Practitioner

## 2023-10-25 ENCOUNTER — Ambulatory Visit (HOSPITAL_BASED_OUTPATIENT_CLINIC_OR_DEPARTMENT_OTHER): Payer: Medicare (Managed Care) | Admitting: Pulmonary Disease

## 2023-10-25 ENCOUNTER — Encounter (HOSPITAL_BASED_OUTPATIENT_CLINIC_OR_DEPARTMENT_OTHER): Payer: Self-pay | Admitting: Nurse Practitioner

## 2023-10-25 VITALS — BP 112/78 | HR 64 | Ht 72.0 in | Wt 149.0 lb

## 2023-10-25 DIAGNOSIS — F17210 Nicotine dependence, cigarettes, uncomplicated: Secondary | ICD-10-CM

## 2023-10-25 DIAGNOSIS — D8685 Sarcoid myocarditis: Secondary | ICD-10-CM | POA: Diagnosis not present

## 2023-10-25 DIAGNOSIS — N184 Chronic kidney disease, stage 4 (severe): Secondary | ICD-10-CM | POA: Diagnosis not present

## 2023-10-25 DIAGNOSIS — J432 Centrilobular emphysema: Secondary | ICD-10-CM

## 2023-10-25 LAB — PULMONARY FUNCTION TEST
FEF 25-75 Post: 2.94 L/s
FEF 25-75 Pre: 1.97 L/s
FEF2575-%Change-Post: 49 %
FEF2575-%Pred-Post: 106 %
FEF2575-%Pred-Pre: 71 %
FEV1-%Change-Post: 6 %
FEV1-%Pred-Post: 67 %
FEV1-%Pred-Pre: 62 %
FEV1-Post: 2.41 L
FEV1-Pre: 2.26 L
FEV1FVC-%Change-Post: 3 %
FEV1FVC-%Pred-Pre: 106 %
FEV6-%Change-Post: 3 %
FEV6-%Pred-Post: 64 %
FEV6-%Pred-Pre: 62 %
FEV6-Post: 2.95 L
FEV6-Pre: 2.86 L
FEV6FVC-%Pred-Post: 105 %
FEV6FVC-%Pred-Pre: 105 %
FVC-%Change-Post: 3 %
FVC-%Pred-Post: 60 %
FVC-%Pred-Pre: 59 %
FVC-Post: 2.95 L
FVC-Pre: 2.86 L
Post FEV1/FVC ratio: 82 %
Post FEV6/FVC ratio: 100 %
Pre FEV1/FVC ratio: 79 %
Pre FEV6/FVC Ratio: 100 %

## 2023-10-25 NOTE — Assessment & Plan Note (Signed)
 Declining kidney function. Provided with contact information to schedule appt with [facility] - referral previously placed

## 2023-10-25 NOTE — Patient Instructions (Signed)
 Spirometry pre post only today.

## 2023-10-25 NOTE — Assessment & Plan Note (Addendum)
 Not a candidate for immunosuppression therapy given comorbidities. No evidence of pulmonary sarcoidosis. Continue to monitor with repeat imaging and PFT in a year. Follow up with cardiology as scheduled.  Patient Instructions  Lung function doesn't show any evidence of COPD. You do have a restriction in your lung function, which you can see in people with heart failure and kidney disease.   We will plan to monitor your lung function periodically given your history of sarcoid but so far, there is no evidence of active sarcoid in your lungs  Follow up with cardiology as scheduled  Make sure you schedule an appointment with nephrology   Follow up in one year after PFT with Dr. Washington Hacker. If symptoms do not improve or worsen, please contact office for sooner follow up or seek emergency care.

## 2023-10-25 NOTE — Patient Instructions (Addendum)
 Lung function doesn't show any evidence of COPD. You do have a restriction in your lung function, which you can see in people with heart failure and kidney disease.   We will plan to monitor your lung function periodically given your history of sarcoid but so far, there is no evidence of active sarcoid in your lungs  Follow up with cardiology as scheduled  Make sure you schedule an appointment with nephrology   Follow up in one year after PFT with Dr. Washington Hacker. If symptoms do not improve or worsen, please contact office for sooner follow up or seek emergency care.

## 2023-10-25 NOTE — Assessment & Plan Note (Signed)
 No formal obstruction and asymptomatic. Smoking cessation advised. No indication for scheduled bronchodilators at this time. Continue to monitor.

## 2023-10-25 NOTE — Progress Notes (Signed)
 Patient had great difficulty performing pft. Spirometry pre/post performed. Patient unable to get into box( double amputee) Patient unable to perform dlco without mouth slipping off mouthpiece.

## 2023-10-26 ENCOUNTER — Inpatient Hospital Stay (HOSPITAL_BASED_OUTPATIENT_CLINIC_OR_DEPARTMENT_OTHER): Payer: Medicare (Managed Care) | Admitting: Oncology

## 2023-10-26 VITALS — BP 137/73 | HR 58 | Temp 98.3°F | Resp 16

## 2023-10-26 DIAGNOSIS — D75839 Thrombocytosis, unspecified: Secondary | ICD-10-CM

## 2023-10-26 DIAGNOSIS — D649 Anemia, unspecified: Secondary | ICD-10-CM | POA: Diagnosis not present

## 2023-10-26 DIAGNOSIS — N1832 Chronic kidney disease, stage 3b: Secondary | ICD-10-CM

## 2023-10-26 DIAGNOSIS — D631 Anemia in chronic kidney disease: Secondary | ICD-10-CM

## 2023-10-26 DIAGNOSIS — Z87891 Personal history of nicotine dependence: Secondary | ICD-10-CM | POA: Diagnosis not present

## 2023-10-26 DIAGNOSIS — E538 Deficiency of other specified B group vitamins: Secondary | ICD-10-CM

## 2023-10-26 NOTE — Progress Notes (Unsigned)
 [name] [address] [place], [place] [address]   CLINIC:  Medical Oncology/Hematology  PCP:  Adella Agee, DO [address] [place] [address] [phone]   REASON FOR VISIT:  Follow-up for thrombocytosis and leukocytosis   PRIOR THERAPY: None   CURRENT THERAPY: Surveillance  INTERVAL HISTORY:   Todd [name] 68 y.o. male returns for routine follow-up of thrombocytosis and leukocytosis.  He was last evaluated via video visit with Sheril Dines PA-C on 10/26/22.   He has annual LDCT scans completed last on 01/16/23 which was negative.   Patient was seen yesterday by Girard Lam, NP for follow-up for cardiac sarcoidosis and COPD.  No evidence of pulmonary sarcoidosis.  Plan is for annual imaging and PFTs.  Patient had echocardiogram on 10/04/2023 which revealed left ventricular EF of 45 to 50%.  Left atrial size was severely dilated.  At today's visit, he reports feeling well.  No recent hospitalizations, surgeries, or changes in baseline health status.  Patient presents with staff member from [facility] and rehabilitation center.  Reports overall doing well.  He denies any new or concerning symptoms.  Denies any bleeding or bright red blood per rectum.  Denies any recent infections for the use of antibiotics.  Denies any B symptoms or unintentional weight loss.  Reports appetite is 75% energy levels of 100%.  ASSESSMENT & PLAN:  1.  Thrombocytosis & neutrophilic leukocytosis -  Intermittent thrombocytosis since at least 2017, with maximum platelets 1009 on 08/30/2016. - Intermittent leukocytosis (primarily neutrophilic) since at least 2017, with highest WBC recorded at 30.6 on 09/11/2016.  (During March/April 2018 - patient was being treated for diabetic foot ulcers prior to his bilateral AKA.) - History of bilateral AKA secondary to diabetic foot ulcers/PAD (2018)   - Denies recent infections or steroid medications.  No  autoimmune/connective tissue disorder.  No splenectomy.  No personal history of cancer, up-to-date on age-appropriate cancer screenings. - He does not take any aspirin , but is on Eliquis  due to paroxysmal atrial fibrillation. - Current smoker (2 cigarettes daily since moving into SNF, previously smoked 0.5 PPD x45 years) - No history of DVT or PE. - Hematology work-up (01/12/2022): Negative JAK2 with reflex to CALR and MPL Negative BCR/ABL FISH Elevated ESR 45, elevated CRP 1.0.  Normal ANA and RF.  Normal LDH. Elevated ferritin with normal saturation and TIBC. - Most recent labs (10/19/2022): Platelets moderately elevated 562, normal WBC 9.8. - No aquagenic pruritus, B symptoms, or vasomotor symptoms. Treated for suspected aspiration pneumonia in December 2023. - MPN work-up negative.  Due to chronicity of intermittent thrombocytosis and leukocytosis, differential diagnosis favors reactive process secondary to patient's extensive chronic comorbidities, smoking, and chronic inflammation. - Repeat CBC with RTC in 6 months.  If any significant deviations from baseline we will consider bone marrow biopsy.   2.  Normocytic anemia - Labs from SNF (11/15/2021) show Hgb 10.8/MCV 81.3.  CMP (09/14/2021) showed creatinine 3.68/GFR 19. - Denies history of blood loss or iron deficiency, but received PRBC x2 in August 2022 in the setting of severe anemia (Hgb 6.6) - Hemoccult positive in August 2022. - EGD/colonoscopy (01/13/2021): HSV esophagitis with ulceration, gastritis, duodenitis.  Petechiae in ascending colon and cecum, polyps x3 (tubular adenoma), diverticulosis. - Medications include folic acid , multivitamin, thiamine , vitamin D  supplements; he takes Protonix  and Eliquis  as well. - Has previously followed with [facility] - Hematology work-up (01/12/2022): SPEP negative.  Immunofixation with polyclonal immunoglobulins.  Elevated free light chains  with normal ratio in keeping with CKD. Normal B12, copper , and  folate.  Elevated ferritin 675 with normal TIBC and iron saturation 20%. Creatinine 1.97/GFR 37.  - Most recent labs (10/19/2022): Hgb 11.5, creatinine 1.90/GFR 38.  Ferritin 524, iron saturation 30% with low TIBC 232. - Differential diagnosis favors anemia of chronic disease/CKD stage IIIb/IV, with prior history of blood loss anemia. - No indication for ESA or IV iron at this time. - Repeat CBC, CMP, and iron panel with RTC in 6 months   3.  Borderline B12 deficiency - Labs from August 2023 show normal B12 with mildly elevated MMA 508 - He has been taking vitamin B12 500 mcg daily since September 2023  - Most recent labs (10/19/2022): Normal B12 809, MMA pending  - Continue vitamin B12 500 mcg daily.  Repeat B12/MMA annually (next due May 2025)    4.  Tobacco abuse - This patient meets criteria for low-dose CT lung cancer screening (active smoker, 22.5 pack year history) - LDCT chest (01/31/2022): Lung RADS category 2, benign appearance.  Incidental findings of coronary atherosclerosis, aortic atherosclerosis, nephrolithiasis, and emphysema. - Next LDCT chest due August 2025   5.  Other history - MAJOR COMORBIDITIES: Diastolic CHF, CKD stage IIIb/IV, insulin -dependent type 2 diabetes mellitus, paroxysmal A-fib (on Eliquis ) - ADDITIONAL PMH: S/p bilateral AKA (2018) secondary to diabetic foot ulcer/PAD, hypertension, GERD, gastritis/duodenitis, HSV esophagitis - SOCIAL: Currently resides at [facility].  Wheelchair dependent.  Currently disabled due to bilateral AKA.  Previously worked in [employer] representative. - SUBSTANCE: History of daily alcohol use, quit after moving into SNF in 2022.  History of daily cocaine use, quit in 2022 after moving into SNF.  Smoked 0.5 PPD cigarettes since age 30, cut back to about 3 cigarettes daily at age 56 after moving into SNF.  PLAN SUMMARY: >> LDCT chest in August 2024  >> Labs in 1 year = CBC/D, CMP, ferritin, iron/TIBC, ESR, CRP, LDH, B12, MMA >> OFFICE  visit in 1 year (1-2 weeks after labs)     REVIEW OF SYSTEMS: Patient denies any acute complaints at today's visit.  Review of Systems  Constitutional:  Negative for appetite change, chills, diaphoresis, fatigue, fever and unexpected weight change.  HENT:   Negative for lump/mass and nosebleeds.   Eyes:  Negative for eye problems.  Respiratory:  Negative for cough, hemoptysis and shortness of breath.   Cardiovascular:  Negative for chest pain, leg swelling and palpitations.  Gastrointestinal:  Negative for abdominal pain, blood in stool, constipation, diarrhea, nausea and vomiting.  Genitourinary:  Negative for hematuria.   Skin: Negative.   Neurological:  Negative for dizziness, headaches and light-headedness.  Hematological:  Does not bruise/bleed easily.     PHYSICAL EXAM:  ECOG PERFORMANCE STATUS: 0 - Asymptomatic >> Wheelchair-bound  Vitals:   10/26/23 1012  BP: 137/73  Pulse: (!) 58  Resp: 16  Temp: 98.3 F (36.8 C)  SpO2: 100%   There were no vitals filed for this visit. Physical Exam Constitutional:      Appearance: Normal appearance.     Comments: Presents in wheelchair.  HENT:     Head: Normocephalic and atraumatic.     Mouth/Throat:     Mouth: Mucous membranes are moist.  Eyes:     Extraocular Movements: Extraocular movements intact.     Pupils: Pupils are equal, round, and reactive to light.  Cardiovascular:     Rate and Rhythm: Normal rate and regular rhythm.     Pulses:  Normal pulses.     Heart sounds: Normal heart sounds.  Pulmonary:     Effort: Pulmonary effort is normal.     Breath sounds: Normal breath sounds.  Abdominal:     General: Bowel sounds are normal.     Palpations: Abdomen is soft.     Tenderness: There is no abdominal tenderness.  Musculoskeletal:        General: No swelling.     Right lower leg: No edema.     Left lower leg: No edema.     Right Lower Extremity: Right leg is amputated above knee.     Left Lower Extremity: Left leg  is amputated above knee.  Lymphadenopathy:     Cervical: No cervical adenopathy.  Skin:    General: Skin is warm and dry.  Neurological:     General: No focal deficit present.     Mental Status: He is alert and oriented to person, place, and time.  Psychiatric:        Mood and Affect: Mood normal.        Behavior: Behavior normal.     PAST MEDICAL/SURGICAL HISTORY:  Past Medical History:  Diagnosis Date   AKI (acute kidney injury) (HCC)    Alcohol abuse    Cocaine abuse (HCC) 01/25/2021   Constipated    Diabetes mellitus without complication (HCC)    Diarrhea    Elevated LFTs    Goiter    Gout    Hyperlipidemia    Hypertension    Leukocytosis    Reactive thrombocytosis    Right BKA infection (HCC) 08/2016   Right leg pain    Sepsis due to undetermined organism (HCC)    Thyroid  disease    Wound infection after surgery 08/2016   Past Surgical History:  Procedure Laterality Date   ABDOMINAL AORTOGRAM N/A 08/11/2016   Procedure: Abdominal Aortogram;  Surgeon: Adine Hoof, MD;  Location: [facility] INVASIVE CV LAB;  Service: Cardiovascular;  Laterality: N/A;   ABDOMINAL AORTOGRAM W/LOWER EXTREMITY N/A 08/15/2016   Procedure: Abdominal Aortogram w/Lower Extremity;  Surgeon: Richrd Char, MD;  Location: [facility] INVASIVE CV LAB;  Service: Cardiovascular;  Laterality: N/A;   AMPUTATION Right 08/17/2016   Procedure: RIGHT BELOW KNEE AMPUTATION;  Surgeon: Richrd Char, MD;  Location: [facility] OR;  Service: Vascular;  Laterality: Right;   AMPUTATION Right 09/12/2016   Procedure: AMPUTATION ABOVE KNEE;  Surgeon: Timothy Ford, MD;  Location: [facility] OR;  Service: Orthopedics;  Laterality: Right;   AMPUTATION Left 08/12/2016   Procedure: LEFT BELOW KNEE AMPUTATION;  Surgeon: Timothy Ford, MD;  Location: [facility] OR;  Service: Orthopedics;  Laterality: Left;   AMPUTATION Left 11/01/2017   Procedure: LEFT ABOVE KNEE AMPUTATION;  Surgeon: Timothy Ford, MD;  Location: [facility] OR;  Service: Orthopedics;   Laterality: Left;   APPLICATION OF WOUND VAC Right 09/12/2016   Procedure: APPLICATION OF WOUND VAC ABOVE KNEE;  Surgeon: Timothy Ford, MD;  Location: [facility] OR;  Service: Orthopedics;  Laterality: Right;   BIOPSY  01/13/2021   Procedure: BIOPSY;  Surgeon: Ruby Corporal, MD;  Location: [facility] ENDO SUITE;  Service: Endoscopy;;  esophageal   COLONOSCOPY WITH PROPOFOL  N/A 01/13/2021   Procedure: COLONOSCOPY WITH PROPOFOL ;  Surgeon: Ruby Corporal, MD;  Location: [facility]ITE;  Service: Endoscopy;  Laterality: N/A;   CORONARY PRESSURE/FFR STUDY N/A 05/09/2022   Procedure: INTRAVASCULAR PRESSURE WIRE/FFR STUDY;  Surgeon: Sammy Crisp, MD;  Location: [facility] INVASIVE CV LAB;  Service:  Cardiovascular;  Laterality: N/A;   CORONARY STENT INTERVENTION N/A 05/09/2022   Procedure: CORONARY STENT INTERVENTION;  Surgeon: Sammy Crisp, MD;  Location: [facility] INVASIVE CV LAB;  Service: Cardiovascular;  Laterality: N/A;   ESOPHAGOGASTRODUODENOSCOPY (EGD) WITH PROPOFOL  N/A 01/13/2021   Procedure: ESOPHAGOGASTRODUODENOSCOPY (EGD) WITH PROPOFOL ;  Surgeon: Ruby Corporal, MD;  [facility];  Service: Endoscopy;  Laterality: N/A;   LEFT HEART CATH AND CORONARY ANGIOGRAPHY N/A 05/09/2022   Procedure: LEFT HEART CATH AND CORONARY ANGIOGRAPHY;  Surgeon: Sammy Crisp, MD;  Location: [facility] INVASIVE CV LAB;  Service: Cardiovascular;  Laterality: N/A;   LOWER EXTREMITY ANGIOGRAPHY Bilateral 08/11/2016   Procedure: Lower Extremity Angiography;  Surgeon: Adine Hoof, MD;  Location: [facility] INVASIVE CV LAB;  Service: Cardiovascular;  Laterality: Bilateral;   PERIPHERAL VASCULAR BALLOON ANGIOPLASTY Left 08/11/2016   Procedure: Peripheral Vascular Balloon Angioplasty;  Surgeon: Adine Hoof, MD;  Location: [facility] INVASIVE CV LAB;  Service: Cardiovascular;  Laterality: Left;  SFA   POLYPECTOMY  01/13/2021   Procedure: POLYPECTOMY;  Surgeon: Ruby Corporal, MD;  [facility];  Service: Endoscopy;;    THYROID  SURGERY      SOCIAL HISTORY:  Social History   Socioeconomic History   Marital status: Single    Spouse name: Not on file   Number of children: Not on file   Years of education: Not on file   Highest education level: Not on file  Occupational History   Occupation: retired    Comment: drove a Glass blower/designer  Tobacco Use   Smoking status: Every Day    Current packs/day: 0.00    Average packs/day: 0.3 packs/day for 45.0 years (11.3 ttl pk-yrs)    Types: Cigarettes    Start date: 11/11/1969    Last attempt to quit: 11/12/2014    Years since quitting: 8.9    Passive exposure: Never   Smokeless tobacco: Never  Vaping Use   Vaping status: Never Used  Substance and Sexual Activity   Alcohol use: Not Currently    Alcohol/week: 2.0 standard drinks of alcohol    Types: 1 Glasses of wine, 1 Cans of beer per week   Drug use: Not Currently    Types: Cocaine   Sexual activity: Not Currently  Other Topics Concern   Not on file  Social History Narrative   09/23/20 - Lives alone, uses a wheelchair, double amputee, not married, no children - HHA comes in 3x per week to help him bathe and set up weekly meds.   Social Drivers of Corporate investment banker Strain: Low Risk  (08/05/2022)   Received from [facility]   Overall Financial Resource Strain (CARDIA)    Difficulty of Paying Living Expenses: Not hard at all  Food Insecurity: No Food Insecurity (08/05/2022)   Received from [facility], [facility]   Hunger Vital Sign    Worried About Running Out of Food in the Last Year: Never true    Ran Out of Food in the Last Year: Never true  Transportation Needs: No Transportation Needs (08/05/2022)   Received from [facility], [facility]   PRAPARE - Transportation    Lack of Transportation (Medical): No    Lack of Transportation (Non-Medical): No  Physical Activity: Inactive (09/23/2020)   Exercise Vital Sign    Days of Exercise per Week: 0 days    Minutes of  Exercise per Session: 0 min  Stress: No Stress Concern Present (09/23/2020)   [employer] of Occupational  Health - Occupational Stress Questionnaire    Feeling of Stress : Only a little  Social Connections: Unknown (07/25/2022)   Received from [facility], [facility]   Social Network    Social Network: Not on file  Intimate Partner Violence: Unknown (07/25/2022)   [facility] from [facility]   HITS    Physically Hurt: Not on file    Insult or Talk Down To: Not on file    Threaten Physical Harm: Not on file    Scream or Curse: Not on file    FAMILY HISTORY:  Family History  Problem Relation Age of Onset   Heart disease Mother    Pneumonia Father    Diabetes Maternal Aunt    Diabetes Maternal Uncle     CURRENT MEDICATIONS:  Outpatient Encounter Medications as of 10/26/2023  Medication Sig Note   acetaminophen  (TYLENOL ) 325 MG tablet Take 2 tablets (650 mg total) by mouth every 6 (six) hours as needed for mild pain (or Fever >/= 101). (Patient taking differently: Take 650 mg by mouth every 6 (six) hours as needed for moderate pain (pain score 4-6).)    allopurinol  (ZYLOPRIM ) 100 MG tablet Take 1 tablet (100 mg total) by mouth daily. (Patient taking differently: Take 100 mg by mouth in the morning.)    apixaban  (ELIQUIS ) 5 MG TABS tablet Take 1 tablet (5 mg total) by mouth 2 (two) times daily.    Ascorbic Acid  500 MG CAPS Take 500 mg by mouth daily.    atorvastatin  (LIPITOR) 80 MG tablet Take 1 tablet (80 mg total) by mouth daily.    calcium  carbonate (TUMS - DOSED IN MG ELEMENTAL CALCIUM ) 500 MG chewable tablet Chew 1,000 mg by mouth 3 (three) times daily before meals.    Cholecalciferol  (VITAMIN D3) 50 MCG (2000 UT) capsule Take 2,000 Units by mouth in the morning.    cyanocobalamin  (V-R VITAMIN B-12) 500 MCG tablet Take 1 tablet (500 mcg total) by mouth in the morning.    ferrous sulfate 324 MG TBEC Take 324 mg by mouth.    FIASP  FLEXTOUCH 100 UNIT/ML  FlexTouch Pen Inject 0-6 Units into the skin 3 (three) times daily before meals. Per SSI 10/17/2022: Sliding Scale   folic acid  (FOLVITE ) 1 MG tablet Take 1 mg by mouth in the morning.    Glucagon, rDNA, (GLUCAGON EMERGENCY) 1 MG KIT Inject 1 mg into the muscle as needed (hypoglycemia). 05/04/2022: Listed PRN on MAR, no doses received in November 2023.   hydrALAZINE  (APRESOLINE ) 25 MG tablet Take 1 tablet (25 mg total) by mouth 3 (three) times daily.    isosorbide  mononitrate (IMDUR ) 60 MG 24 hr tablet Take 1 tablet (60 mg total) by mouth daily.    levothyroxine  (SYNTHROID ) 75 MCG tablet Take 1 tablet (75 mcg total) by mouth daily before breakfast. (Patient taking differently: Take 100 mcg by mouth daily before breakfast.) 01/18/2023: Patient is taking 38 mcg daily.   lidocaine  (LIDODERM ) 5 % Place 1 patch onto the skin daily. Remove & Discard patch within 12 hours or as directed by MD    magnesium  oxide (MAG-OX) 400 (240 Mg) MG tablet Take 400 mg by mouth daily.    metoprolol  succinate (TOPROL -XL) 100 MG 24 hr tablet Take 1 tablet (100 mg total) by mouth daily.    mexiletine (MEXITIL ) 150 MG capsule Take 1 capsule (150 mg total) by mouth every 8 (eight) hours.    mirtazapine  (REMERON ) 15 MG tablet Take 15 mg by mouth at  bedtime.    Multiple Vitamin (MULTIVITAMIN WITH MINERALS) TABS tablet Take 1 tablet by mouth daily. (Patient taking differently: Take 1 tablet by mouth in the morning.)    Nutritional Supplements (NUTRITIONAL DRINK) LIQD Take 240 mLs by mouth daily at 12 noon. House Supplement    pantoprazole  (PROTONIX ) 40 MG tablet Take 1 tablet (40 mg total) by mouth 2 (two) times daily.    polyethylene glycol (MIRALAX  / GLYCOLAX ) 17 g packet Take 17 g by mouth daily. (Patient taking differently: Take 17 g by mouth in the morning.)    sertraline  (ZOLOFT ) 50 MG tablet Take 1 tablet (50 mg total) by mouth at bedtime.    sodium bicarbonate  650 MG tablet Take 650 mg by mouth 3 (three) times daily before  meals.    thiamine  100 MG tablet Take 1 tablet (100 mg total) by mouth daily. (Patient taking differently: Take 100 mg by mouth in the morning.)    No facility-administered encounter medications on file as of 10/26/2023.    ALLERGIES:  Allergies  Allergen Reactions   Lisinopril  Other (See Comments)    Hyperkalemia / Renal failure    LABORATORY DATA:  I have reviewed the labs as listed.  CBC    Component Value Date/Time   WBC 9.8 10/13/2023 1002   RBC 4.64 10/13/2023 1002   HGB 12.9 (L) 10/13/2023 1002   HGB 12.5 (L) 08/08/2023 1613   HCT 40.7 10/13/2023 1002   HCT 38.4 08/08/2023 1613   PLT 465 (H) 10/13/2023 1002   PLT 459 (H) 08/08/2023 1613   MCV 87.7 10/13/2023 1002   MCV 86 08/08/2023 1613   MCH 27.8 10/13/2023 1002   MCHC 31.7 10/13/2023 1002   RDW 16.2 (H) 10/13/2023 1002   RDW 15.2 08/08/2023 1613   LYMPHSABS 2.9 10/13/2023 1002   LYMPHSABS 2.5 08/08/2023 1613   MONOABS 0.6 10/13/2023 1002   EOSABS 0.5 10/13/2023 1002   EOSABS 0.5 (H) 08/08/2023 1613   BASOSABS 0.1 10/13/2023 1002   BASOSABS 0.1 08/08/2023 1613      Latest Ref Rng & Units 10/13/2023   10:02 AM 09/20/2023    3:11 PM 08/08/2023    4:13 PM  CMP  Glucose 70 - 99 mg/dL 86  73  66   BUN 8 - 23 mg/dL 29  27  24    Creatinine 0.61 - 1.24 mg/dL 9.60  4.54  0.98   Sodium 135 - 145 mmol/L 142  147  144   Potassium 3.5 - 5.1 mmol/L 4.0  4.5  4.4   Chloride 98 - 111 mmol/L 111  109  106   CO2 22 - 32 mmol/L 22  19  21    Calcium  8.9 - 10.3 mg/dL 7.1  7.4  8.1   Total Protein 6.5 - 8.1 g/dL 7.4     Total Bilirubin 0.0 - 1.2 mg/dL 0.5     Alkaline Phos 38 - 126 U/L 228     AST 15 - 41 U/L 29     ALT 0 - 44 U/L 22       DIAGNOSTIC IMAGING:  I have independently reviewed the relevant imaging and discussed with the patient.   WRAP UP:  All questions were answered. The patient knows to call the clinic with any problems, questions or concerns.  Medical decision making: Moderate  Time spent on visit:  I spent 20 minutes counseling the patient face to face. The total time spent in the appointment was 30 minutes and more than 50%  was on counseling.  Aurther Blue, NP  10/26/23 10:14 AM

## 2023-10-27 ENCOUNTER — Ambulatory Visit: Payer: Self-pay | Admitting: Internal Medicine

## 2023-11-17 ENCOUNTER — Telehealth (HOSPITAL_COMMUNITY): Payer: Self-pay | Admitting: Cardiology

## 2023-11-17 NOTE — Telephone Encounter (Signed)
 Front office called [facility] facility to schedule pt a f/u appt with Dr. Lafaye Pierini office spoke to receptionist and was transferred to appointment scheduler - - left a voice mail with appt scheduler to call office back to schedule f/u visit with Dr. Bruce Caper.

## 2023-12-20 NOTE — Progress Notes (Signed)
 ADVANCED HEART FAILURE CLINIC NOTE  Referring Physician: Andi [name], *  Primary Care: Todd Jointer, DO Primary Cardiologist: Dr. Mallipeddi  CC: Evaluation for cardiac sarcoid, HFrEF  HPI: Todd [name] is a 68 y.o. male with CAD status post LAD PCI in November 2023, history of VT in 04/2021 and 04/2022 (associated with cocaine use in 2022 and CAD requiring PCI in 2023), prolonged QT currently on mexiletine, paroxysmal atrial fibrillation, hypertension, insulin -dependent diabetes, hyperlipidemia, bilateral lower extremity AKA due to poorly controlled type 2 diabetes, CKD 4 presenting today to establish care.  Due to his history of recurrent VT and reduced LV function, patient underwent cardiac MRI which demonstrated LGE in a noncoronary distribution.  He had a follow-up PET scan which was consistent with cardiac sarcoid.  He prevents for further evaluation.  According to Todd [name] he has not had any ventricular tachycardia for at least 1 year now.  He was unaware of his diagnosis of cardiac sarcoid.  Currently lives at a assisted living facility.  He is wheelchair-bound due to bilateral AKA's.  Reports that his shortness of breath is fairly well-controlled and he does not feel limited by his underlying heart failure.  Intreval hx:  - No recent hospitalizations.  - No chest pain, shortness of breath, edema.     Current Outpatient Medications  Medication Sig Dispense Refill   allopurinol  (ZYLOPRIM ) 100 MG tablet Take 1 tablet (100 mg total) by mouth daily. 30 tablet 1   apixaban  (ELIQUIS ) 5 MG TABS tablet Take 1 tablet (5 mg total) by mouth 2 (two) times daily. 60 tablet 5   Ascorbic Acid  500 MG CAPS Take 500 mg by mouth daily.     atorvastatin  (LIPITOR) 80 MG tablet Take 1 tablet (80 mg total) by mouth daily. 90 tablet 3   calcium  carbonate (TUMS - DOSED IN MG ELEMENTAL CALCIUM ) 500 MG chewable tablet Chew 1,000 mg by mouth 3 (three) times daily before meals.      Cholecalciferol  (VITAMIN D3) 50 MCG (2000 UT) capsule Take 2,000 Units by mouth in the morning.     cyanocobalamin  (V-R VITAMIN B-12) 500 MCG tablet Take 1 tablet (500 mcg total) by mouth in the morning. 30 tablet 6   ferrous sulfate 324 MG TBEC Take 324 mg by mouth.     FIASP  FLEXTOUCH 100 UNIT/ML FlexTouch Pen Inject 0-6 Units into the skin 3 (three) times daily before meals. Per SSI     folic acid  (FOLVITE ) 1 MG tablet Take 1 mg by mouth in the morning.     Glucagon, rDNA, (GLUCAGON EMERGENCY) 1 MG KIT Inject 1 mg into the muscle as needed (hypoglycemia).     hydrALAZINE  (APRESOLINE ) 50 MG tablet Take 50 mg by mouth 3 (three) times daily.     isosorbide  mononitrate (IMDUR ) 60 MG 24 hr tablet Take 1 tablet (60 mg total) by mouth daily. 90 tablet 3   levothyroxine  (SYNTHROID ) 100 MCG tablet Take 100 mcg by mouth daily before breakfast.     magnesium  oxide (MAG-OX) 400 (240 Mg) MG tablet Take 400 mg by mouth daily.     metoprolol  succinate (TOPROL -XL) 100 MG 24 hr tablet Take 1 tablet (100 mg total) by mouth daily. 90 tablet 3   mexiletine (MEXITIL ) 150 MG capsule Take 1 capsule (150 mg total) by mouth every 8 (eight) hours.     mirtazapine  (REMERON ) 15 MG tablet Take 15 mg by mouth at bedtime.     Multiple Vitamin (MULTIVITAMIN WITH MINERALS) TABS tablet Take  1 tablet by mouth daily.     Nutritional Supplements (NUTRITIONAL DRINK) LIQD Take 240 mLs by mouth daily at 12 noon. House Supplement     pantoprazole  (PROTONIX ) 40 MG tablet Take 1 tablet (40 mg total) by mouth 2 (two) times daily.     polyethylene glycol (MIRALAX  / GLYCOLAX ) 17 g packet Take 17 g by mouth daily. 14 each 0   sertraline  (ZOLOFT ) 50 MG tablet Take 1 tablet (50 mg total) by mouth at bedtime. 30 tablet 1   sodium bicarbonate  650 MG tablet Take 650 mg by mouth 3 (three) times daily before meals.     thiamine  100 MG tablet Take 1 tablet (100 mg total) by mouth daily. 30 tablet 3   acetaminophen  (TYLENOL ) 325 MG tablet Take 2  tablets (650 mg total) by mouth every 6 (six) hours as needed for mild pain (or Fever >/= 101). (Patient taking differently: Take 650 mg by mouth every 6 (six) hours as needed for moderate pain (pain score 4-6).)     No current facility-administered medications for this encounter.   PHYSICAL EXAM: Vitals:   12/21/23 1132  BP: 104/70  Pulse: 75  SpO2: 99%   GENERAL: NAD Lungs- normal work of breathing CARDIAC:  JVP: 7 cm          Normal rate with regular rhythm. no murmur.  Pulses 2+.  ABDOMEN: Soft, non-tender, non-distended.  EXTREMITIES: B/L BKA NEUROLOGIC: No obvious FND    DATA REVIEW  ECG: 02/10/23: NSR  As per my personal interpretation  ECHO: 05/04/22: LVEF 45-50%, normal RV function  CATH: 05/09/22: Significant single-vessel coronary artery disease with 60-70% mid LAD stenosis that is hemodynamically significant (RFR = 0.87).  There is mild, non-obstructive disease in the mid LCx and RCA. Normal left ventricular filling pressure (LVEDP 10 mmHg). Successful RFR-guided PCI to mid LAD using synergy 2.75 x 16 mm drug-eluting stent (postdilated to 3.1 mm) with 0% residual stenosis and TIMI-3 flow.   CMR:  1. Mild LVE with global hypokinesis worse in the basal septum. Moderate LVH septal thickness 15 mm LVEF 31%  2. Gadolinium enhancement similar to study done 07/22/21 see description above and consistent with sarcoid  3.  Mildly elevated T1 and ECV  4.  Normal RV size and function RVEF 55%  5.  Decreased cardiac output 2.4 L/min  6.  Mild bi atrial enlargement  CARDIAC PET: 05/18/23:    FDG uptake findings are consistent with active myocardial inflammation/sarcoidosis.   FDG uptake was observed. FDG uptake was focal. FDG uptake was present in the basal to mid septal and lateral walls. LV perfusion is normal.   Left ventricular function is abnormal. Global function is moderately reduced. EF: 34%.   Coronary calcium  assessment not performed due to prior  revascularization.   Electronically signed by Todd Nanas, MD  ASSESSMENT & PLAN:  Heart failure with reduced EF Etiology of YQ:fpkzi ischemic and nonischemic 2/2 CAD & possible underlying sarcoid NYHA class / AHA Stage: NYHA II-III, limited by deconditioning and b/l AKA Volume status & Diuretics: euvolemic to hypovolemic Vasodilators:hydralazine  50mg  TID, imdur  60mg  daily Beta-Blocker:metoprolol  100mg  daily FMJ:rnwumjpwiprjuzi 2/2 advanced CKD Cardiometabolic:hx of uncontrolled T2DM; can start at follow up.  Devices therapies & Valvulopathies:followed by EP Advanced therapies:Not a candidate.   2. Evaluation for cardiac sarcoid - LGE pattern demonstrated on CMR & postive PET in the setting of VT is very likely consistent with cardiac sarcoid. I had an extensive discussion with Mr. Bellis about this today.  -  I remain very concerned about starting him on immunosuppression for sarcoid with his current medical comorbidities and poor healthcare literacy. He has bilateral AKAs due to uncontrolled T2DM, CKD IV, hx of polysubstance abuse, VT that occurred after cocaine use.  I feel that the risk of infection and side effects related to immunosupression out weigh the benefits. We could revisit this if he has further clinical decline secondary to presumed cardiac sarcoid.  - I will reach out to his primary cardiologist, EP and PCP to discuss this further.  - Quantiferon gold today to rule out indolent TB.  - HRCT to evaluate for hilar adenopathy; would prefer tissue sample confirming sarcoid if we were to pursue high risk immunosuppression.   3. CAD - s/p PCI to the LAD in 11/23 - continue plavix  75mg  daily  - lipitor 80mg  daily - no chest pain currently  - followed by Dr. Mallipeddi  4. Hx of VT - Initially associated with cocaine use in 11/22. He had a second episode of VT in 11/23; LHC at that time demonstrated severe stenosis of the mid LAD now s/p PCI - Would hold off treatment  for sarcoid unless he has VT that is not secondary to drug use / underlying CAD and deemed to be primary from sarcoid.  - mexiletine 150mg  TID  5. pAFIB - NSR today - continue apixaban   - continue toprol   6. CKD IV - sCr severely elevated to 4 from 2.  - appears euvolemic to hypovolemic on exam; hold diuretics.  - Reaching out to nephrology for close follow up and repeat labs.   7. Tobacco use - counseled on smoking cessation. Continues to smoke 2-3 cigs daily.   8. T2dm - Better controlled now; A1C 5.5   Todd [name] Advanced Heart Failure Mechanical Circulatory Support

## 2023-12-21 ENCOUNTER — Ambulatory Visit (HOSPITAL_COMMUNITY)
Admission: RE | Admit: 2023-12-21 | Discharge: 2023-12-21 | Disposition: A | Discharge status: Home / Self Care | Payer: Medicare (Managed Care) | Source: Ambulatory Visit | Attending: Cardiology | Admitting: Cardiology

## 2023-12-21 ENCOUNTER — Encounter (HOSPITAL_COMMUNITY): Payer: Self-pay | Admitting: Cardiology

## 2023-12-21 VITALS — BP 104/70 | HR 75

## 2023-12-21 DIAGNOSIS — I4581 Long QT syndrome: Secondary | ICD-10-CM | POA: Insufficient documentation

## 2023-12-21 DIAGNOSIS — I5042 Chronic combined systolic (congestive) and diastolic (congestive) heart failure: Secondary | ICD-10-CM

## 2023-12-21 DIAGNOSIS — I472 Ventricular tachycardia, unspecified: Secondary | ICD-10-CM | POA: Insufficient documentation

## 2023-12-21 DIAGNOSIS — Z89612 Acquired absence of left leg above knee: Secondary | ICD-10-CM | POA: Diagnosis not present

## 2023-12-21 DIAGNOSIS — E1165 Type 2 diabetes mellitus with hyperglycemia: Secondary | ICD-10-CM | POA: Insufficient documentation

## 2023-12-21 DIAGNOSIS — Z89611 Acquired absence of right leg above knee: Secondary | ICD-10-CM | POA: Diagnosis not present

## 2023-12-21 DIAGNOSIS — I2510 Atherosclerotic heart disease of native coronary artery without angina pectoris: Secondary | ICD-10-CM | POA: Insufficient documentation

## 2023-12-21 DIAGNOSIS — Z955 Presence of coronary angioplasty implant and graft: Secondary | ICD-10-CM | POA: Insufficient documentation

## 2023-12-21 DIAGNOSIS — I480 Paroxysmal atrial fibrillation: Secondary | ICD-10-CM | POA: Insufficient documentation

## 2023-12-21 DIAGNOSIS — Z556 Problems related to health literacy: Secondary | ICD-10-CM | POA: Insufficient documentation

## 2023-12-21 DIAGNOSIS — E785 Hyperlipidemia, unspecified: Secondary | ICD-10-CM | POA: Diagnosis not present

## 2023-12-21 DIAGNOSIS — E1122 Type 2 diabetes mellitus with diabetic chronic kidney disease: Secondary | ICD-10-CM | POA: Diagnosis not present

## 2023-12-21 DIAGNOSIS — D8685 Sarcoid myocarditis: Secondary | ICD-10-CM | POA: Diagnosis not present

## 2023-12-21 DIAGNOSIS — I5022 Chronic systolic (congestive) heart failure: Secondary | ICD-10-CM | POA: Diagnosis present

## 2023-12-21 DIAGNOSIS — F17210 Nicotine dependence, cigarettes, uncomplicated: Secondary | ICD-10-CM | POA: Insufficient documentation

## 2023-12-21 DIAGNOSIS — I130 Hypertensive heart and chronic kidney disease with heart failure and stage 1 through stage 4 chronic kidney disease, or unspecified chronic kidney disease: Secondary | ICD-10-CM | POA: Diagnosis not present

## 2023-12-21 DIAGNOSIS — Z7902 Long term (current) use of antithrombotics/antiplatelets: Secondary | ICD-10-CM | POA: Diagnosis not present

## 2023-12-21 DIAGNOSIS — I428 Other cardiomyopathies: Secondary | ICD-10-CM | POA: Diagnosis not present

## 2023-12-21 DIAGNOSIS — Z794 Long term (current) use of insulin: Secondary | ICD-10-CM | POA: Insufficient documentation

## 2023-12-21 DIAGNOSIS — N184 Chronic kidney disease, stage 4 (severe): Secondary | ICD-10-CM | POA: Diagnosis not present

## 2023-12-21 NOTE — Patient Instructions (Addendum)
 Medication Changes:  No Changes In Medications at this time.   Lab Work:  PLEASE GO ACROSS THE STREET TO [facility] TO HAVE LABS DRAWN TODAY   Follow-Up in: 4 MONTHS WITH DR. GARDENIA PLEASE CALL OUR OFFICE AROUND SEPTEMBER TO GET SCHEDULED FOR YOUR APPOINTMENT. PHONE NUMBER IS [phone] OPTION 2   At the [facility], you and your health needs are our priority. We have a designated team specialized in the treatment of Heart Failure. This Care Team includes your primary Heart Failure Specialized Cardiologist (physician), Advanced Practice Providers (APPs- Physician Assistants and Nurse Practitioners), and Pharmacist who all work together to provide you with the care you need, when you need it.   You may see any of the following providers on your designated Care Team at your next follow up:  Dr. Toribio Fuel Dr. Ezra Shuck Dr. Ria GARDENIA Dr. Odis Brownie Greig Mosses, NP Caffie Shed, [place] [facility] [place], [place] Beckey Coe, NP [place] Lee, NP Tinnie Redman, PharmD  Please be sure to bring in all your medications bottles to every appointment.   Need to Contact Us :  If you have any questions or concerns before your next appointment please send us  a message through [place] or call our office at [phone].    TO LEAVE A MESSAGE FOR THE NURSE SELECT OPTION 2, PLEASE LEAVE A MESSAGE INCLUDING: YOUR NAME DATE OF BIRTH CALL BACK NUMBER REASON FOR CALL**this is important as we prioritize the call backs  YOU WILL RECEIVE A CALL BACK THE SAME DAY AS LONG AS YOU CALL BEFORE 4:00 PM

## 2024-01-26 ENCOUNTER — Ambulatory Visit (HOSPITAL_COMMUNITY): Payer: Medicare (Managed Care)

## 2024-02-29 ENCOUNTER — Encounter: Payer: Self-pay | Admitting: *Deleted

## 2024-03-04 NOTE — Progress Notes (Deleted)
     Electrophysiology Clinic Note    Date:  03/04/2024  Patient ID:  Todd, [name] 1956/01/09, MRN [id number] PCP:  Todd Jointer, DO  Cardiologist:  Todd P Mallipeddi, MD   Electrophysiologist:  Todd ONEIDA HOLTS, MD    ***refresh  Discussed the use of AI scribe software for clinical note transcription with the patient, who gave verbal consent to proceed.   Patient Profile    Chief Complaint: ***  History of Present Illness: Todd [name] is a 68 y.o. male with PMH notable for ***; seen today for Todd ONEIDA HOLTS, MD for {VISITTYPE:28148}     Arrhythmia/Device History No specialty comments available.    ROS:  Please see the history of present illness. All other systems are reviewed and otherwise negative.    Physical Exam    VS:  There were no vitals taken for this visit. BMI: There is no height or weight on file to calculate BMI.      Wt Readings from Last 3 Encounters:  10/25/23 149 lb (67.6 kg)  06/24/22 147 lb (66.7 kg)  05/14/22 149 lb 14.6 oz (68 kg)     GEN- The patient is well appearing, alert and oriented x 3 today.   Lungs- Clear to ausculation bilaterally, normal work of breathing.  Heart- {Blank single:19197::Regular,Irregularly irregular} rate and rhythm, no murmurs, rubs or gallops Extremities- {EDEMA LEVEL:28147::No} peripheral edema, warm, dry Skin-  *** device pocket well-healed, no tethering   Device interrogation done today and reviewed by myself:  Battery *** Lead thresholds, impedence, sensing stable *** *** episodes *** changes made today   Studies Reviewed   Previous EP, cardiology notes.    EKG {ACTION; IS/IS WNU:78978602} ordered. Personal review of EKG from {Blank single:19197::today,***} shows:  ***             Assessment and Plan     #) ***   #) ***   {Are you ordering a CV Procedure (e.g. stress test, cath, DCCV, TEE, etc)?   Press F2        :[id number]}   Current  medicines are reviewed at length with the patient today.   The patient {ACTIONS; HAS/DOES NOT HAVE:19233} concerns regarding his medicines.  The following changes were made today:  {NONE DEFAULTED:18576}  Labs/ tests ordered today include: *** No orders of the defined types were placed in this encounter.    Disposition: Follow up with {EPMDS:28135::EP Team} or EP APP {EPFOLLOW UP:28173}   Signed, Todd Needle, NP  03/04/24  1:34 PM  Electrophysiology [facility]

## 2024-03-05 ENCOUNTER — Ambulatory Visit: Payer: Medicare (Managed Care) | Admitting: Cardiology

## 2024-03-20 ENCOUNTER — Ambulatory Visit: Payer: Medicare (Managed Care) | Admitting: Cardiology

## 2024-04-02 ENCOUNTER — Telehealth (HOSPITAL_COMMUNITY): Payer: Self-pay

## 2024-04-02 ENCOUNTER — Ambulatory Visit: Payer: Medicare (Managed Care)

## 2024-04-02 ENCOUNTER — Ambulatory Visit: Payer: Medicare (Managed Care) | Attending: Cardiology | Admitting: Cardiology

## 2024-04-02 VITALS — BP 142/64 | HR 58 | Wt 149.0 lb

## 2024-04-02 DIAGNOSIS — I5042 Chronic combined systolic (congestive) and diastolic (congestive) heart failure: Secondary | ICD-10-CM | POA: Diagnosis not present

## 2024-04-02 DIAGNOSIS — Z794 Long term (current) use of insulin: Secondary | ICD-10-CM

## 2024-04-02 DIAGNOSIS — D8685 Sarcoid myocarditis: Secondary | ICD-10-CM

## 2024-04-02 DIAGNOSIS — E118 Type 2 diabetes mellitus with unspecified complications: Secondary | ICD-10-CM | POA: Diagnosis not present

## 2024-04-02 DIAGNOSIS — I472 Ventricular tachycardia, unspecified: Secondary | ICD-10-CM

## 2024-04-02 DIAGNOSIS — E038 Other specified hypothyroidism: Secondary | ICD-10-CM

## 2024-04-02 NOTE — Progress Notes (Signed)
 Electrophysiology Clinic Note    Date:  04/02/2024  Patient ID:  Todd [name], Todd [name] 1956/01/13, MRN [id number] PCP:  Andi Jointer, DO  Cardiologist:  Vishnu P Mallipeddi, MD HF cardiologist - Sabharwal  Electrophysiologist:  OLE ONEIDA HOLTS, MD     Discussed the use of AI scribe software for clinical note transcription with the patient, who gave verbal consent to proceed.   Patient Profile    Chief Complaint: EP follow-up  History of Present Illness: Todd [name] is a 68 y.o. male with PMH notable for HFmrEF, CAD s/p PCI (04/2022), cardiac sarcoid, VT iso cocaine use and STEMI, prolonged QT, parox AFib, HTN, T2DM, HLD, bilateral AKA, CKD-4; seen today for OLE ONEIDA HOLTS, MD for routine electrophysiology followup.   He last saw Dr. HOLTS 01/2023 for discussion of ICD, not candidate at that time d/t active substance use and reversible cause of arrhythmia (LAD occlusion). Planned for cardiac PET to further eval for cardiac sarcoid, which was consistent with cardiac sarcoid.  He saw HF Dr. Gardenia 06/2023 to establish care, did not favor starting immunosuppression d/t co-morbidities. Follow-up with HF on 12/2023 with same recommendations.   ON follow-up today, he has no acute concerns. He denies chest pain, chest pressure, palpitations, syncope or SOB. He says he has not used cocaine in 5 years    Arrhythmia/Device History No specialty comments available.    ROS:  Please see the history of present illness. All other systems are reviewed and otherwise negative.    Physical Exam    VS:  BP (!) 142/64 (BP Location: Left Arm, Patient Position: Sitting, Cuff Size: Normal)   Pulse (!) 58   Wt 149 lb (67.6 kg)   SpO2 99%   BMI 20.21 kg/m  BMI: Body mass index is 20.21 kg/m.      Wt Readings from Last 3 Encounters:  04/02/24 149 lb (67.6 kg)  10/25/23 149 lb (67.6 kg)  06/24/22 147 lb (66.7 kg)     GEN- The patient is well appearing, alert and  oriented x 3 today.   Lungs- Clear to ausculation bilaterally, normal work of breathing.  Heart- Regular rate and rhythm, no murmurs, rubs or gallops Extremities- No peripheral edema, bilateral AKA noted   Studies Reviewed   Previous EP, cardiology notes.    EKG is ordered. Personal review of EKG from today shows:    EKG Interpretation Date/Time:  Tuesday April 02 2024 14:14:24 EDT Ventricular Rate:  58 PR Interval:  186 QRS Duration:  100 QT Interval:  500 QTC Calculation: 490 R Axis:   -17  Text Interpretation: Sinus bradycardia ST & T wave abnormality, consider anterolateral ischemia Confirmed by Cassell Voorhies [phone]) on 04/02/2024 2:15:51 PM    TTE, 10/04/2023  1. Left ventricular ejection fraction, by estimation, is 45 to 50%. The left ventricle has mildly decreased function. Left ventricular endocardial border not optimally defined to evaluate regional wall motion. There is moderate left ventricular hypertrophy. Left ventricular diastolic parameters are indeterminate.   2. Right ventricular systolic function is normal. The right ventricular size is normal. Tricuspid regurgitation signal is inadequate for assessing PA pressure.   3. Left atrial size was severely dilated.   4. The mitral valve is normal in structure. Trivial mitral valve regurgitation. No evidence of mitral stenosis.   5. The aortic valve is tricuspid. There is mild calcification of the aortic valve. There is mild thickening of the aortic valve. Aortic valve regurgitation is not visualized. No aortic stenosis  is present.   6. The inferior vena cava is normal in size with greater than 50% respiratory variability, suggesting right atrial pressure of 3 mmHg.   Myocardial PET, 05/16/2024   FDG uptake findings are consistent with active myocardial inflammation/sarcoidosis.   FDG uptake was observed. FDG uptake was focal. FDG uptake was present in the basal to mid septal and lateral walls. LV perfusion is normal.    Left ventricular function is abnormal. Global function is moderately reduced. EF: 34%.   Coronary calcium  assessment not performed due to prior revascularization.  Cardiac MRI, 12/30/2022 1. Mild LVE with global hypokinesis worse in the basal septum. Moderate LVH septal thickness 15 mm LVEF 31%  2. Gadolinium enhancement similar to study done 07/22/21 see description above and consistent with sarcoid  3.  Mildly elevated T1 and ECV  4.  Normal RV size and function RVEF 55%  5.  Decreased cardiac output 2.4 L/min  6.  Mild bi atrial enlargement  LHC, 05/09/2022 Significant single-vessel coronary artery disease with 60-70% mid LAD stenosis that is hemodynamically significant (RFR = 0.87).  There is mild, non-obstructive disease in the mid LCx and RCA. Normal left ventricular filling pressure (LVEDP 10 mmHg). Successful RFR-guided PCI to mid LAD using synergy 2.75 x 16 mm drug-eluting stent (postdilated to 3.1 mm) with 0% residual stenosis and TIMI-3 flow.  TTE, 05/04/2022  1. Left ventricular ejection fraction, by estimation, is 45 to 50%. The left ventricle has mildly decreased function. The left ventricle demonstrates global hypokinesis. There is mild concentric left ventricular hypertrophy. Left ventricular diastolic parameters are consistent with Grade II diastolic dysfunction (pseudonormalization).   2. Right ventricular systolic function is normal. The right ventricular size is normal. Tricuspid regurgitation signal is inadequate for assessing PA pressure.   3. Left atrial size was severely dilated.   4. Right atrial size was mild to moderately dilated.   5. The mitral valve is grossly normal. No evidence of mitral valve regurgitation. No evidence of mitral stenosis.   6. The aortic valve was not well visualized. Aortic valve regurgitation is not visualized.   7. The inferior vena cava is normal in size with greater than 50% respiratory variability, suggesting right atrial pressure of 3 mmHg.    Comparison(s): No significant change from prior study. Prior images reviewed side by side.   Assessment and Plan     #) cardiac sarcoid #) mixed NICM, ICM #) h/o VT #) CKD-4 Cardiac MRI and Myocardial PET both consistent with cardiac sarcoid Prior VT episodes were iso cocaine use (2022), or with severe LAD stenosis (2023).  No further VT episodes that he is aware of Update 2 week monitor to confirm arrhythmia is quiescent Continue 150mg  mexiletine TID Continue 100mg  toprol  daily  Ideally, ICD options including transvenous which would limit his ability to self-push wheelchair for 6 weeks. He also has CKD-4 so very likely will need HD in the future.  Also considered sub-q which does not have pacing ability.   At this time, will update 2 week zio to confirm arrhythmia is quiescent. If having NSVT will re-consider sub-q ICD  Update BMP, Mag   #) parox AFib No symptomatic burden Update zio as above Continue 5mg  eliquis  for stroke ppx Update CBC   #) hypothyroid Update TSH Continue 100mcg synthroid  daily  #) T2DM Update A1c Continue insulin  as per facility provider   Discussed plan with Dr. Kennyth     Current medicines are reviewed at length with the patient today.  The patient does not have concerns regarding his medicines.  The following changes were made today:  none  Labs/ tests ordered today include:  Orders Placed This Encounter  Procedures   Basic metabolic panel with GFR   CBC   Magnesium    TSH   Hemoglobin A1c   LONG TERM MONITOR (3-14 DAYS)   EKG 12-Lead     Disposition: Follow up with Dr. Kennyth or EP APP in 12 months  Follow- up with HF team in 2-3 months  Follow-up with gen cards team - Dr. Mallipeddi or NP Miriam in [place] in 6 months   Signed, Miu Chiong, NP  04/02/24  3:30 PM  Electrophysiology [facility]

## 2024-04-02 NOTE — Patient Instructions (Signed)
 Medication Instructions:  Your physician recommends that you continue on your current medications as directed. Please refer to the Current Medication list given to you today.   *If you need a refill on your cardiac medications before your next appointment, please call your pharmacy*  Lab Work: Your provider would like for you to have following labs drawn today CBC, BMP, MAG, TSH, HGBA1C.    If you have labs (blood work) drawn today and your tests are completely normal, you will receive your results only by: MyChart Message (if you have MyChart) OR A paper copy in the mail If you have any lab test that is abnormal or we need to change your treatment, we will call you to review the results.  Testing/Procedures: [name]- Long Term Monitor Instructions  Your physician has requested you wear a ZIO patch monitor for 14 days.  This is a single patch monitor. [employer] supplies one patch monitor per enrollment. Additional stickers are not available. Please do not apply patch if you will be having a Nuclear Stress Test, Echocardiogram, Cardiac CT, MRI, or Chest Xray during the period you would be wearing the monitor. The patch cannot be worn during these tests. You cannot remove and re-apply the ZIO XT patch monitor.  Your ZIO patch monitor will be mailed 3 day USPS to your address on file. It may take 3-5 days to receive your monitor after you have been enrolled. Once you have received your monitor, please review the enclosed instructions. Your monitor has already been registered assigning a specific monitor serial number to you.  Billing and Patient Assistance Program Information  We have supplied [employer] with any of your insurance information on file for billing purposes.  [employer] offers a sliding scale Patient Assistance Program for patients that do not have insurance, or whose insurance does not completely cover the cost of the ZIO monitor.  You must apply for the Patient Assistance Program to qualify  for this discounted rate.  To apply, please call [employer] at [phone], select option 4, select option 2, ask to apply for Patient Assistance Program. [name] will ask your household income, and how many people are in your household. They will quote your out-of-pocket cost based on that information. [employer] will also be able to set up a 64-month, interest-free payment plan if needed.  Applying the monitor   Shave hair from upper left chest.  Hold abrader disc by Todd tab. Rub abrader in 40 strokes over the upper left chest as indicated in your monitor instructions.  Clean area with 4 enclosed alcohol pads. Let dry.  Apply patch as indicated in monitor instructions. Patch will be placed under collarbone on left side of chest with arrow pointing upward.  Rub patch adhesive wings for 2 minutes. Remove white label marked 1. Remove the white label marked 2. Rub patch adhesive wings for 2 additional minutes.  While looking in a mirror, press and release button in center of patch. A small green light will flash 3-4 times. This will be your only indicator that the monitor has been turned on.  Do not shower for the first 24 hours. You may shower after the first 24 hours.  Press the button if you feel a symptom. You will hear a small click. Record Date, Time and Symptom in the Patient Logbook.  When you are ready to remove the patch, follow instructions on the last 2 pages of Patient Logbook.  Stick patch monitor into the tabs at the bottom of the  return box.  Place Patient Logbook in the blue and white box. Use locking tab on box and tape box closed securely. The blue and white box has prepaid postage on it. Please place it in the mailbox as soon as possible. Your physician should have your test results approximately 7-14 days after the monitor has been mailed back to [name].  Call [facility] Customer Care at [phone] if you have questions regarding your ZIO XT patch monitor.  Call  them immediately if you see an Todd light blinking on your monitor.  If your monitor falls off in less than 4 days, contact our Monitor department at [phone].  If your monitor becomes loose or falls off after 4 days call [employer] at [phone] for suggestions on securing your monitor.   Follow-Up: At [name], you and your health needs are our priority.  As part of our continuing mission to provide you with exceptional heart care, our providers are all part of one team.  This team includes your primary Cardiologist (physician) and Advanced Practice Providers or APPs (Physician Assistants and Nurse Practitioners) who all work together to provide you with the care you need, when you need it.  Your next appointment:   Follow up with heart failure clinic in 2 months in [name] (MD or APP)  Follow up with [name] cardiology in 6 months ([name])   Follow up with EP in 1 years with Dr [name]   Provider:   Dr [name] We recommend signing up for the patient portal called MyChart.  Sign up information is provided on this After Visit Summary.  MyChart is used to connect with patients for Virtual Visits (Telemedicine).  Patients are able to view lab/test results, encounter notes, upcoming appointments, etc.  Non-urgent messages can be sent to your provider as well.   To learn more about what you can do with MyChart, go to [email or web address].

## 2024-04-03 ENCOUNTER — Ambulatory Visit: Payer: Self-pay | Admitting: Cardiology

## 2024-04-03 LAB — HEMOGLOBIN A1C
Est. average glucose Bld gHb Est-mCnc: 105 mg/dL
Hgb A1c MFr Bld: 5.3 % (ref 4.8–5.6)

## 2024-04-03 LAB — BASIC METABOLIC PANEL WITH GFR
BUN/Creatinine Ratio: 14 (ref 10–24)
BUN: 31 mg/dL — ABNORMAL HIGH (ref 8–27)
CO2: 19 mmol/L — ABNORMAL LOW (ref 20–29)
Calcium: 7 mg/dL — ABNORMAL LOW (ref 8.6–10.2)
Chloride: 111 mmol/L — ABNORMAL HIGH (ref 96–106)
Creatinine, Ser: 2.27 mg/dL — ABNORMAL HIGH (ref 0.76–1.27)
Glucose: 138 mg/dL — ABNORMAL HIGH (ref 70–99)
Potassium: 3.8 mmol/L (ref 3.5–5.2)
Sodium: 145 mmol/L — ABNORMAL HIGH (ref 134–144)
eGFR: 31 mL/min/1.73 — ABNORMAL LOW (ref >59)

## 2024-04-03 LAB — CBC
Hematocrit: 31 % — ABNORMAL LOW (ref 37.5–51.0)
Hemoglobin: 9.9 g/dL — ABNORMAL LOW (ref 13.0–17.7)
MCH: 27.4 pg (ref 26.6–33.0)
MCHC: 31.9 g/dL (ref 31.5–35.7)
MCV: 86 fL (ref 79–97)
Platelets: 442 x10E3/uL (ref 150–450)
RBC: 3.61 x10E6/uL — ABNORMAL LOW (ref 4.14–5.80)
RDW: 15 % (ref 11.6–15.4)
WBC: 8.2 x10E3/uL (ref 3.4–10.8)

## 2024-04-03 LAB — MAGNESIUM: Magnesium: 1.3 mg/dL — ABNORMAL LOW (ref 1.6–2.3)

## 2024-04-03 LAB — TSH: TSH: 0.016 u[IU]/mL — ABNORMAL LOW (ref 0.450–4.500)

## 2024-04-03 NOTE — Progress Notes (Signed)
 Report faxed to patient's facility [facility])

## 2024-04-29 ENCOUNTER — Inpatient Hospital Stay: Payer: Medicare (Managed Care) | Attending: Family Medicine

## 2024-05-06 DIAGNOSIS — D8685 Sarcoid myocarditis: Secondary | ICD-10-CM

## 2024-05-07 ENCOUNTER — Encounter: Payer: Self-pay | Admitting: Emergency Medicine

## 2024-05-16 ENCOUNTER — Other Ambulatory Visit: Payer: Self-pay

## 2024-05-16 DIAGNOSIS — Z122 Encounter for screening for malignant neoplasm of respiratory organs: Secondary | ICD-10-CM

## 2024-05-16 DIAGNOSIS — Z87891 Personal history of nicotine dependence: Secondary | ICD-10-CM

## 2024-05-16 DIAGNOSIS — F17210 Nicotine dependence, cigarettes, uncomplicated: Secondary | ICD-10-CM

## 2024-05-29 ENCOUNTER — Ambulatory Visit (HOSPITAL_COMMUNITY)
Admission: RE | Admit: 2024-05-29 | Discharge: 2024-05-29 | Payer: Medicare (Managed Care) | Attending: Acute Care | Admitting: Acute Care

## 2024-05-29 DIAGNOSIS — Z87891 Personal history of nicotine dependence: Secondary | ICD-10-CM | POA: Insufficient documentation

## 2024-05-29 DIAGNOSIS — F17210 Nicotine dependence, cigarettes, uncomplicated: Secondary | ICD-10-CM | POA: Diagnosis present

## 2024-05-29 DIAGNOSIS — Z122 Encounter for screening for malignant neoplasm of respiratory organs: Secondary | ICD-10-CM | POA: Diagnosis present

## 2024-06-03 ENCOUNTER — Telehealth: Payer: Self-pay | Admitting: Acute Care

## 2024-06-03 DIAGNOSIS — Z87891 Personal history of nicotine dependence: Secondary | ICD-10-CM

## 2024-06-03 DIAGNOSIS — Z122 Encounter for screening for malignant neoplasm of respiratory organs: Secondary | ICD-10-CM

## 2024-06-03 DIAGNOSIS — F17210 Nicotine dependence, cigarettes, uncomplicated: Secondary | ICD-10-CM

## 2024-06-03 NOTE — Telephone Encounter (Signed)
 Please call patient . Scan was read as a LR 2. He has a known history of sarcoid. He needs to follow up with his cardiologist as soon as possible . 12 month follow up scan. Let PCP know.

## 2024-06-04 NOTE — Addendum Note (Signed)
 Addended by: RHETT KELLY POUR on: 06/04/2024 09:36 AM   Modules accepted: Orders

## 2024-06-04 NOTE — Telephone Encounter (Signed)
 Called and spoke to pt. Informed him of the results and recommendations from Lauraine Lites NP. Patient last seen by cardiology on 04/02/2024 for cardiac sarcoid and advised to return in one year. Patient is aware he needs sooner follow up. Patient denies any acute symptoms. Patient request his results mailed to him. Results also sent to PCP and cardiologist. Annual LDCT ordered.    IMPRESSION: 1. Lung-RADS 2-S, benign appearance or behavior. Continue annual screening with low-dose chest CT without contrast in 12 months. 2. The S modifier above refers to potentially clinically significant non lung cancer related findings. Specifically, spectrum of findings suggestive of congestive heart failure. Borderline mild cardiomegaly. New small layering bilateral pleural effusions, left greater than right. New mild diffuse interlobular septal thickening with scattered mild ground-glass opacity throughout both lungs, suggesting mild pulmonary edema. 3. Three-vessel coronary atherosclerosis. 4. Diffuse osteopenia. 5. Aortic Atherosclerosis (ICD10-I70.0) and Emphysema (ICD10-J43.9).     Electronically Signed   By: Selinda DELENA Blue M.D.   On: 06/03/2024 10:40

## 2024-06-10 ENCOUNTER — Encounter: Payer: Self-pay | Admitting: *Deleted

## 2024-08-20 ENCOUNTER — Ambulatory Visit: Payer: Medicare (Managed Care) | Admitting: Neurology

## 2024-10-18 ENCOUNTER — Other Ambulatory Visit: Payer: Medicare (Managed Care)

## 2024-10-25 ENCOUNTER — Ambulatory Visit: Payer: Medicare (Managed Care) | Admitting: Oncology
